# Patient Record
Sex: Male | Born: 1960 | Race: Black or African American | Hispanic: No | Smoking: Never smoker
Health system: Southern US, Community
[De-identification: ages and names within clinical notes are randomized; demographics above are authoritative.]

## PROBLEM LIST (undated history)

## (undated) DIAGNOSIS — N186 End stage renal disease: Secondary | ICD-10-CM

## (undated) DIAGNOSIS — D649 Anemia, unspecified: Secondary | ICD-10-CM

## (undated) DIAGNOSIS — I10 Essential (primary) hypertension: Secondary | ICD-10-CM

## (undated) DIAGNOSIS — T7840XA Allergy, unspecified, initial encounter: Secondary | ICD-10-CM

## (undated) DIAGNOSIS — E785 Hyperlipidemia, unspecified: Secondary | ICD-10-CM

## (undated) DIAGNOSIS — Z8489 Family history of other specified conditions: Secondary | ICD-10-CM

## (undated) DIAGNOSIS — Z992 Dependence on renal dialysis: Secondary | ICD-10-CM

## (undated) DIAGNOSIS — Z5189 Encounter for other specified aftercare: Secondary | ICD-10-CM

## (undated) HISTORY — PX: OTHER SURGICAL HISTORY: SHX169

## (undated) HISTORY — PX: KIDNEY TRANSPLANT: SHX239

## (undated) HISTORY — DX: Hyperlipidemia, unspecified: E78.5

## (undated) HISTORY — DX: Dependence on renal dialysis: Z99.2

## (undated) HISTORY — DX: Allergy, unspecified, initial encounter: T78.40XA

## (undated) HISTORY — DX: Encounter for other specified aftercare: Z51.89

## (undated) HISTORY — DX: Essential (primary) hypertension: I10

---

## 2000-05-14 ENCOUNTER — Encounter: Payer: Self-pay | Admitting: Internal Medicine

## 2000-05-14 ENCOUNTER — Ambulatory Visit (HOSPITAL_COMMUNITY): Admission: RE | Admit: 2000-05-14 | Discharge: 2000-05-14 | Payer: Self-pay | Admitting: Internal Medicine

## 2000-05-16 ENCOUNTER — Emergency Department (HOSPITAL_COMMUNITY): Admission: EM | Admit: 2000-05-16 | Discharge: 2000-05-16 | Payer: Self-pay | Admitting: Emergency Medicine

## 2003-02-01 ENCOUNTER — Emergency Department (HOSPITAL_COMMUNITY): Admission: AD | Admit: 2003-02-01 | Discharge: 2003-02-01 | Payer: Self-pay | Admitting: Family Medicine

## 2004-01-26 ENCOUNTER — Encounter: Admission: RE | Admit: 2004-01-26 | Discharge: 2004-01-26 | Payer: Self-pay | Admitting: Nephrology

## 2004-02-05 ENCOUNTER — Encounter: Admission: RE | Admit: 2004-02-05 | Discharge: 2004-02-05 | Payer: Self-pay | Admitting: Nephrology

## 2005-01-03 ENCOUNTER — Observation Stay (HOSPITAL_COMMUNITY): Admission: EM | Admit: 2005-01-03 | Discharge: 2005-01-04 | Payer: Self-pay | Admitting: Emergency Medicine

## 2005-01-15 ENCOUNTER — Ambulatory Visit: Admission: RE | Admit: 2005-01-15 | Discharge: 2005-01-15 | Payer: Self-pay | Admitting: Vascular Surgery

## 2005-01-28 ENCOUNTER — Emergency Department (HOSPITAL_COMMUNITY): Admission: EM | Admit: 2005-01-28 | Discharge: 2005-01-28 | Payer: Self-pay | Admitting: Emergency Medicine

## 2005-04-09 ENCOUNTER — Ambulatory Visit (HOSPITAL_COMMUNITY): Admission: RE | Admit: 2005-04-09 | Discharge: 2005-04-10 | Payer: Self-pay | Admitting: General Surgery

## 2005-05-30 ENCOUNTER — Ambulatory Visit (HOSPITAL_COMMUNITY): Admission: RE | Admit: 2005-05-30 | Discharge: 2005-05-30 | Payer: Self-pay | Admitting: Vascular Surgery

## 2006-06-11 ENCOUNTER — Inpatient Hospital Stay (HOSPITAL_COMMUNITY): Admission: AD | Admit: 2006-06-11 | Discharge: 2006-06-13 | Payer: Self-pay | Admitting: Nephrology

## 2006-06-12 ENCOUNTER — Ambulatory Visit: Payer: Self-pay | Admitting: Vascular Surgery

## 2006-06-18 ENCOUNTER — Encounter (HOSPITAL_COMMUNITY): Admission: RE | Admit: 2006-06-18 | Discharge: 2006-06-19 | Payer: Self-pay | Admitting: Nephrology

## 2007-10-18 ENCOUNTER — Ambulatory Visit (HOSPITAL_COMMUNITY): Admission: RE | Admit: 2007-10-18 | Discharge: 2007-10-18 | Payer: Self-pay | Admitting: Nephrology

## 2007-12-08 ENCOUNTER — Ambulatory Visit (HOSPITAL_COMMUNITY): Admission: RE | Admit: 2007-12-08 | Discharge: 2007-12-08 | Payer: Self-pay | Admitting: Nephrology

## 2008-01-03 ENCOUNTER — Ambulatory Visit (HOSPITAL_COMMUNITY): Admission: RE | Admit: 2008-01-03 | Discharge: 2008-01-03 | Payer: Self-pay | Admitting: Nephrology

## 2008-12-29 ENCOUNTER — Encounter: Admission: RE | Admit: 2008-12-29 | Discharge: 2008-12-29 | Payer: Self-pay | Admitting: Nephrology

## 2009-07-17 ENCOUNTER — Emergency Department (HOSPITAL_COMMUNITY)
Admission: EM | Admit: 2009-07-17 | Discharge: 2009-07-17 | Payer: Self-pay | Source: Home / Self Care | Admitting: Emergency Medicine

## 2010-04-14 ENCOUNTER — Encounter: Payer: Self-pay | Admitting: Nephrology

## 2010-04-15 ENCOUNTER — Encounter: Payer: Self-pay | Admitting: Nephrology

## 2010-08-09 NOTE — Op Note (Signed)
[name], [name] NO.:  [id number]   MEDICAL RECORD NO.:  [id number]          PATIENT TYPE:  OIB   LOCATION:  5528                         FACILITY:  [facility]   PHYSICIAN:  Orson Ape. Weatherly, M.D.DATE OF BIRTH:  11/05/1960   DATE OF PROCEDURE:  04/09/2005  DATE OF DISCHARGE:                                 OPERATIVE REPORT   PREOPERATIVE DIAGNOSIS:  End-stage renal disease secondary to hypertension,  desires chronic ambulatory peritoneal dialysis.   OPERATION:  Placement of double-cuff chronic ambulatory peritoneal dialysis  catheter.   SURGEON:  Orson Ape. Rise Patience, M.D.   ANESTHESIA:  General anesthesia.   HISTORY:  Ryan [name] is a 50 year old black male, a hypertensive  patient, who developed renal failure and was started on hemodialysis,  November '06.  The patient states that he felt poorly after the  hemodialysis, had to stop working and has considered chronic ambulatory  peritoneal dialysis and feels that this will allow him more freedom and  hopes to go back to work, where he works for [employer].  He is presently  divorced and has a child for which he is caring.  The patient had  hemodialysis yesterday and today his hematocrit was 13, white normal at  5800, potassium of 4.0.   DESCRIPTION OF PROCEDURE:  Preoperatively, he was given a gram of Kefzol and  taken to the operating suite with induction of anesthesia.  He is quite  muscular.  Endotracheal tube was position and the abdomen was prepped with  Betadine surgical scrubbing solution and draped in a sterile manner.  He has  had no previous abdominal surgery and a small incision was made slightly  below and to the right of the umbilicus with sharp dissection down through  the skin, subcutaneous tissue and anterior rectus fascia.  A few blood  vessels were coagulated for good hemostasis.  The rectus muscle was split in  the direction of its fibers, exposing the posterior rectus  fascia and  peritoneum; these were picked up between hemostats and very carefully went  through both layers, which were grasped with 3 hemostats.  You could see the  small bowel and I could pull up the posterior rectus fascia and a  pursestring suture of 2-0 Vicryl was placed and then a second placed outside  of this and then an [place] right catheter over the guidewire was inserted  in the lower abdomen and the pursestring sutures tied so that the Silastic  ball was in the peritoneal cavity and the internal cuff was lying flat on  the posterior rectus fascia.  I then anesthetized this fascia and peritoneum  with Marcaine with adrenaline.  The rectus muscle was approximated with 3-0  chromic sutures and then the anterior rectus fascia was closed with  interrupted sutures of 2-0 Prolene.  The catheter goes obliquely through the  abdominal wall.  I repositioned the catheter with the Foley catheter guide  and then tunneled the catheter subcutaneously to exit in the right lower  quadrant.  The external cuff is right at the skin-dermis junction and the  catheter flushes easily and returns clear.  The subcutaneous tissue was  closed with 3-0 chromic.  The skin was closed with interrupted 5-0 nylon and  the catheter was hooked up to the connector and then extension tube; it  flushed easily and the patient was then extubated and taken to the recovery  room in a stable postop condition.  Antibiotic ointment and 4 x 4's were applied and the catheter dressing was  securely attached to the abdomen.  The patient probably would like to spend  the night, since he is for hemodialysis the first thing in the morning and  be discharged afterwards.           ______________________________  Orson Ape. Rise Patience, M.D.     WJW/MEDQ  D:  04/09/2005  T:  04/10/2005  Job:  [id number]   cc:   Darrold Span. Florene Glen, M.D.  Fax: [phone]   [facility]   Burgoon Deterding, M.D.  Fax: [phone]

## 2010-08-09 NOTE — Consult Note (Signed)
Ryan [name], Ryan [name] NO.:  [id number]   MEDICAL RECORD NO.:  [id number]          PATIENT TYPE:  INP   LOCATION:  5526                         FACILITY:  [facility]   PHYSICIAN:  Imogene Burn. Georgette Dover, M.D. DATE OF BIRTH:  [birth year]   DATE OF CONSULTATION:  DATE OF DISCHARGE:                                 CONSULTATION   REASON FOR CONSULTATION:  Removal of peritoneal dialysis catheter.   The patient is a 50 year old male with end-stage renal disease secondary  hypertension.  Dr. Rise Patience placed a peritoneal dialysis catheter  January 2007.  Apparently the patient has had several episodes of  peritonitis growing Stenotrophomonas.  Apparently the nephrologists have  had limited success in treating this with antibiotics.  He now needs the  catheter removed.  Apparently there was some difficulty in getting this  scheduled as an outpatient so the patient was admitted to the hospital  and we were consulted for removal.  The patient is on antibiotics and  feels fine.  He does not have any abdominal pain at this time.   PAST MEDICAL HISTORY:  1. End-stage renal disease.  2. Hypertension.  3. Hyperlipidemia.  4. Gout.  5. Secondary hyperparathyroidism.  6. History of medical noncompliance.   PAST SURGICAL HISTORY:  Diatek catheter placement. Peritoneal dialysis  catheter placement   ALLERGIES:  None.   MEDICATIONS:  1. Clonidine.  2. Metoprolol.  3. Nephro-Vite.  4. PhosLo.  5. Nifedipine.   SOCIAL HISTORY:  Nonsmoker, nondrinker.   PHYSICAL EXAMINATION:  VITAL SIGNS:  Temperature 98.1, pulse 90, blood  pressure 159/112, respirations 20, 98% on room air.  GENERAL:  This is a well-developed, well-nourished male in no apparent  distress.  HEENT:  EOMI.  Sclerae anicteric.  HEART:  Regular rate and rhythm.  LUNGS:  Clear.  ABDOMEN:  Intact PD catheter site with no tenderness, no erythema.  No  fluctuans.   IMPRESSION:  Recurrent bacterial peritonitis needing  peritoneal dialysis  catheter removed.   PLAN:  We will make the patient n.p.o. for now.  We will schedule him  for removal of the PD catheter later today.      Imogene Burn. Tsuei, M.D.  Electronically Signed     MKT/MEDQ  D:  06/12/2006  T:  06/12/2006  Job:  [id number]

## 2010-08-09 NOTE — Discharge Summary (Signed)
NAMEGOMER, Ryan [name] NO.:  [id number]   MEDICAL RECORD NO.:  [id number]          PATIENT TYPE:  OBV   LOCATION:  5524                         FACILITY:  [facility]   PHYSICIAN:  Alvin C. Florene Glen, M.D.  DATE OF BIRTH:  10-24-1960   DATE OF ADMISSION:  01/03/2005  DATE OF DISCHARGE:  01/04/2005                                 DISCHARGE SUMMARY   ADMITTING DIAGNOSIS:  1.  Uremia.  2.  Uncontrolled hypertension.  3.  Anemia of chronic disease.  4.  Non-adherence and denial of medical condition.  5.  History of gout.  6.  Hyperlipidemia.  7.  Advanced chronic kidney disease.  8.  Metabolic acidosis secondary to chronic kidney disease.   DISCHARGE DIAGNOSIS:  1.  Uremia.  2.  Advanced chronic kidney disease to near end-stage renal disease.  3.  Uncontrolled hypertension.  4.  Non-adherence and denial of medical condition.  5.  Hyperlipidemia.  6.  Gout.  7.  Anemia.  8.  Metabolic acidosis secondary to chronic kidney disease.   BRIEF HISTORY:  50 year old black male with chronic kidney disease secondary  to uncontrolled hypertension first seen in our office April 2004 with a  creatinine of 2.2 and blood pressure of 170/120.  He appeared again as an  outpatient on November 2005 having missed or cancelled several previous  appointments.  At that time his creatinine was 3.3 and his blood pressure  was 172/120.  He was counseled on compliance of antihypertensive medications  and severity of kidney disease.  Again, he was lost to follow-up until he  return for an office visit December 27, 2004 with a creatinine of 13.5, a BUN  of 105, potassium 3.5, blood pressure 200/136.  To encourage compliance the  patient was changed to four generic antihypertensive medications which were  furosemide, clonidine, metoprolol, and Procardia SR and asked to return for  a follow-up blood pressure check.  He was seen one day prior to this  admission at [facility] with a blood  pressure of 150/114,  complaining of feeling poorly, cramping, fatigued, without appetite, and  decreased urine output.  Today he was asked to come to the hospital for  induction of dialysis and upon presenting himself became very upset that he  was tricked into hospitalization and wanted no part of initiation of  dialysis.   LABORATORIES ON ADMISSION:  White count 8500, hemoglobin 10.5, platelets  402,000.  Sodium 139, potassium 3.8, chloride 105, CO2 18, BUN 124,  creatinine 15.2, calcium 8.1, albumin 3.2, glucose 96.  LFTs within normal  limits.  Phosphorus 9.8.  Transferrin saturation level 17%, ferritin 346.   HOSPITAL COURSE:  Several long discussions were had with the patient with  myself and then again with Dr. Florene Glen and then again with Dr. [name].  These were all in regard to the severity of his kidney disease,  the requirement for the initiation of dialysis, and long-term prognosis.  He  was quite upset.  He wanted to be discharged and needed time to think about  all what lie ahead.  He wanted all arrangements  made on an outpatient basis.  He did did view one of the hemodialysis movies.  He was in no acute  distress.  An ultrasound of his kidneys showed no hydronephrosis, but with  diffuse echogenicity.  The left kidney measured 10.4 cm.  The right kidney  measured 9.3 cm.  His urinalysis was positive for blood, protein,  leukocytes, but negative for nitrites.  His lungs were clear.  There was no  cardiac rub.  He did have mild asterixis and trace pretibial edema.   Dr. Gae Gallop met with the patient and discussed access with him.  It  was concluded the patient while was refusing to stay and he being relatively  medically stable he was discharged home.  Outpatient arrangements were made  for vein mapping to be done Monday, October 16 at Dr. Nicole Cella office.  He  will have Diatek and left AV fistula versus AV graft placed Tuesday, January 07, 2005.    DISCHARGE MEDICATIONS:  1.  Clonidine 0.2 mg b.i.d.  2.  Metoprolol 100 mg b.i.d.  3.  Procardia SR 60 mg q.p.m.  4.  Nephro-Vite vitamin one daily.  5.  Calcium carbonate 500 mg one with each meal      Nonah Mattes, P.A.      Darrold Span. Florene Glen, M.D.  Electronically Signed    RRK/MEDQ  D:  03/24/2005  T:  03/24/2005  Job:  [id number]

## 2010-08-09 NOTE — Discharge Summary (Signed)
NAMETEHRON, LAVER NO.:  [id number]   MEDICAL RECORD NO.:  [id number]          PATIENT TYPE:  INP   LOCATION:  5526                         FACILITY:  [facility]   PHYSICIAN:  Nonah Mattes, P.A.   DATE OF BIRTH:  December 11, 1960   DATE OF ADMISSION:  06/11/2006  DATE OF DISCHARGE:  06/13/2006                               DISCHARGE SUMMARY   ADMITTING DIAGNOSES:  1. Recurrent peritonitis.  2. End-stage renal disease on peritoneal dialysis.  3. Hypertension.  4. Anemia of chronic disease.  5. Hypothyroidism.   DISCHARGE DIAGNOSES:  1. Status post removal of peritoneal dialysis catheter, secondary to      recurrent Stenotrophomonas and Pseudomonas peritonitis March 20,      Dr. Kaylyn Lim.  2. Status post acute tissue bleed at that site of removed PD catheter      March 10, Dr. Kaylyn Lim.  3. Placement of right IJ Arrow dialysis catheter, Dr. Ruta Hinds,      June 12, 2006.  4. End-stage renal disease, now on hemodialysis.  5. Hypertension.  6. Anemia of chronic disease.  7. Hypothyroidism.   BRIEF HISTORY:  A 50 year old black male with end-stage renal disease,  on chronic peritoneal dialysis since January 2007, has had recurrent  gram- negative peritonitis with various organisms including  Stenotrophomonas and Pseudomonas.  Again, has been refractory to  antibiotic therapy.  Yesterday, his BD white blood cell count was 4,400  and it was discussed to remove the catheter for 2-4 months and use  hemodialysis in the interim.  As an outpatient, he is being treated with  Tobramycin and Levaquin.  He is being admitted now for PD catheter  removal.   LABS ON ADMISSION:  White count 7,700, hemoglobin 10.1, platelets  269,000, sodium 137, potassium 4.9, chloride 99, CO2 30, glucose 127,  BUN 43, creatinine 13.6, albumin 2.4, LFTs within normal limits,  phosphorus 5.7, PD white count of 1,670 with 68 segs, 11 lymphocytes, 21  monocytes.   HOSPITAL COURSE:   The patient was admitted, put on his usual  medications, and put on Primaxin and Levaquin as antibiotics.  On the  second hospital day, he underwent removal of PD catheter and placement  of a right IJ dialysis catheter, which he tolerated well.  Postoperatively, his catheter removal site bled, despite pressure  dressings and frequent dressing changes.  That same evening, he was  returned to the operating room where Dr. Hassell Done did an irrigation and  evacuation of hematoma and ligation of fascia.  Postoperatively, that  time he had no further bleeding.  The patient's hemoglobin was 8.6 at  time of discharge.   A dialysis catheter was used for one dialysis treatment on March 22.  A  post-dialysis weight of 82.0 kg was obtained.  He had some mild cramping  which resolved.  The catheter functioned well.  Iron study showed a  transferrin saturation level of 18%.  Aranesp was started in the  hospital, and EPO will be continued as an outpatient at 20,000 units IV  each dialysis.  InFeD was given as a test dose in  the hospital and will  be continued as a bolus of 100 mg IV each dialysis x10 mid-weekly. Tight  heparin will be used for 2 weeks.  Patient is to follow-up with Dr.  Rise Patience, 1-2 weeks after discharge.  Outpatient arrangements for  dialysis were made at the [facility] every  Tuesday, Thursday, Saturday at 11:30 a.m.  He had no further bleeding,  and he was discharged to complete antibiotics as an outpatient.   DISCHARGE MEDICATIONS:  1. Levaquin 500 mg, one every evening for 2 more weeks.  2. Dialyvite one daily 667 mg, three with meals.  3. Nifedipine 90 mg q.h.s.  4. Lisinopril 40 mg q.h.s.  5. Clonidine 0.1 mg b.i.d.  6. Hold the morning of dialysis.  7. Vicodin 1-2 pills every 6 hours p.r.n. pain.  8. EPO 20,000 units IV each dialysis.  9. InFeD 100 mg IV each dialysis x10, then weekly.  10.Estimated dry weight 81 kg.  Follow up with Dr. Rise Patience 1-2  weeks      after discharge.      Nonah Mattes, P.A.     RRK/MEDQ  D:  07/16/2006  T:  07/16/2006  Job:  [id number]   cc:   Orson Ape. Rise Patience, M.D.

## 2010-08-09 NOTE — Op Note (Signed)
[name], [name] NO.:  [id number]   MEDICAL RECORD NO.:  [id number]          PATIENT TYPE:  INP   LOCATION:  5526                         FACILITY:  [facility]   PHYSICIAN:  Isabel Caprice. Hassell Done, MD  DATE OF BIRTH:  09/19/1960   DATE OF PROCEDURE:  06/12/2006  DATE OF DISCHARGE:                               OPERATIVE REPORT   PREOPERATIVE DIAGNOSIS:  Bleeding from site of removal of peritoneal  dialysis catheter.   POSTOPERATIVE DIAGNOSIS:  Bleeding from site of removal of peritoneal  dialysis catheter.   PROCEDURE:  Re-exploration of peritoneal dialysis catheter wound,  cautery of muscle and tract with the Bovie, and closure of the fascia.   SURGEON:  Isabel Caprice. Hassell Done, M.D.   ANESTHESIA:  General endotracheal.   DESCRIPTION OF PROCEDURE:  Ryan [name] underwent removal of a  peritoneal dialysis catheter at about 1500 hours today and also had a  Diatek catheter placed by Dr. Oneida Alar.  That was earlier in the afternoon  and he went back to his room.  This evening while lying in bed, maybe  after moving around a little bit, he began having some bleeding from  into his wound.  I was contacted at about 8:45, told that he was  bleeding, and requested a suture set to the bedside, but I was contacted  later by the rapid response team and we elected to go ahead and meet him  directly in the operating room to explore this area.   The patient was taken back to room 16 and given general anesthesia.  Endotracheal intubation was performed and the abdomen was then prepped  with TechniCare.  I opened his incision a little bit more and with  retractors, visualized well the muscles.  I did this after I removed the  packing.  There was no active arterial bleeding, although I suspect  there might be something in the muscle and I went ahead and went to the  depths of this wound and cauterized it with electrocautery and suctioned  on it, looked at it, and stimulated it, and saw  no active pumping or  bleeders.  I went ahead and completed the cautery of the entire tract.  I then approximated the fascia with three sutures of 0 Vicryl,  approximating the fascia over the muscle and incorporating the muscle in  this closure.  There was no bleeding seen.  I cauterized the edges of  the wound and I went ahead and stapled the wound shut after irrigating  it and did not elect to repack it at this point.  The patient remained  hemodynamically stable throughout and was taken to the recovery room in  satisfactory addition.  The plan is to take him back to his room.      Isabel Caprice Hassell Done, MD  Electronically Signed     MBM/MEDQ  D:  06/12/2006  T:  06/12/2006  Job:  [id number]

## 2010-08-09 NOTE — Op Note (Signed)
NAMEILYASS, SLAPPY NO.:  [id number]   MEDICAL RECORD NO.:  [id number]          PATIENT TYPE:  INP   LOCATION:  5526                         FACILITY:  [facility]   PHYSICIAN:  Jessy Oto. Fields, MD  DATE OF BIRTH:  06-13-1960   DATE OF PROCEDURE:  06/12/2006  DATE OF DISCHARGE:                               OPERATIVE REPORT   PROCEDURE:  1. Ultrasound of neck.  2. Insertion of right internal jugular vein Diatek catheter.   PREOPERATIVE DIAGNOSIS:  End-stage renal disease.   POSTOPERATIVE DIAGNOSIS:  End-stage renal disease.   ANESTHESIA:  General.   ASSISTANT:  Nurse   OPERATIVE FINDINGS:  1. A 23 cm Diatek catheter right internal jugular vein.   OPERATIVE DETAIL:  After obtaining informed consent, the patient was  taken to the operating.  The patient was placed in the supine position  on the operating table.  After induction of general anesthesia and  endotracheal intubation, the patient had a peritoneal dialysis catheter  removed by Dr. Hassell Done.  This is dictated as a separate operative note.  At conclusion of the removal of peritoneal dialysis catheter portion of  the procedure I performed an ultrasound of the neck.  This showed  bilateral patent internal jugular veins which had normal compressibility  and respiratory variation.  Next the patient's entire neck and chest  were prepped and draped in the usual sterile fashion.  Small finder  needle was used to localize right internal jugular vein.  A larger  introducer needle was used to cannulate the right internal jugular vein  and a 0.035 J-tip guide wire threaded through the right internal jugular  vein down into the inferior vena cava under fluoroscopic guidance.  Next  sequential 12, 14 and 16-French dilators with peel-away sheath placed  over the guidewire in the right atrium.  Guidewire and dilator was  removed.  A 23-cm Diatek catheter was then placed down through the peel-  away sheath into the  right atrium.  Catheter was then tunneled  subcutaneously, cut to length and hub attached.  Catheter was noted to  flush and draw easily.  Catheter was sutured to skin with nylon sutures.  The neck insertion site closed with Vicryl stitch.  Catheter was  inspected under fluoroscopy, found with its tip to be in the right  atrium.  No kinks throughout its course.  Catheter was then loaded with  concentrated heparin solution.  The patient tolerated the procedure well  and there were no complications.  Instrument, sponge, needle counts  correct at the end of the case.  The patient extubated in the operating  room taken recovery room in stable condition.     Jessy Oto. Fields, MD  Electronically Signed    CEF/MEDQ  D:  06/12/2006  T:  06/12/2006  Job:  [id number]

## 2010-08-09 NOTE — Consult Note (Signed)
NAMEMarland [name]  CYNCERE, MUNN NO.:  [id number]   MEDICAL RECORD NO.:  [id number]          PATIENT TYPE:  OIB   LOCATION:  5528                         FACILITY:  [facility]   PHYSICIAN:  Donato Heinz, M.D.DATE OF BIRTH:  01-Jul-1960   DATE OF CONSULTATION:  04/09/2005  DATE OF DISCHARGE:                                   CONSULTATION   REASON FOR CONSULTATION:  Hypertension. End-stage renal disease.   HISTORY OF PRESENT ILLNESS:  Mr. Ryan [name] is a 50 year old African American  male with past medical history significant for end-stage renal disease  secondary to hypertension who started on hemodialysis in November 2006. He  decided that he was going to switch to peritoneal dialysis and was admitted  by Dr. Jeanella Anton for placement of a PD catheter. We have been asked  to help manage his hypertension and end-stage renal disease. He currently  undergoes his hemodialysis every Tuesday-Thursday-Saturday at [facility].   ALLERGIES:  He has no known drug allergy.   PAST MEDICAL HISTORY:  1.  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  2.  Hypertension.  3.  Hyperlipidemia.  4.  Gout.  5.  Secondary hyperthyroidism.  6.  Metabolic acidosis secondary to chronic kidney disease.  7.  History of medical noncompliance.  8.  Secondary hyperparathyroidism.   CURRENT MEDICATIONS:  1.  Clonidine 0.1 mg b.i.d.  2.  Metoprolol 50 mg b.i.d.  3.  Nephro-Vite 1 a day.  4.  PhosLo gel caps 667, two with each meal.  5.  Nifedipine 60 mg at bedtime.   FAMILY HISTORY:  Significant for hypertension, diabetes, and lung cancer.   SOCIAL HISTORY:  He is divorced. He has a child. He is accompanied by his  mother. Denies tobacco or alcohol. He has 2 children that are alive and  well. He worked full time as an Optometrist.   REVIEW OF SYSTEMS:  GENERAL: Denies anorexia or malaise.  OPHTHALMIC: No blurry vision or photophobia.  CARDIAC: No  chest pain, palpitations, orthopnea, PND.  PULMONARY: No shortness of breath, hemoptysis, productive cough.  GI: No nausea, vomiting, hematochezia, melena, or bright red blood per  rectum.  GU: No dysuria, polyuria, hematuria, urgency, frequency, retention.   All other systems are negative.   PHYSICAL EXAM:  GENERAL: A well-developed, well-nourished man in no current  distress.  VITAL SIGNS: He is afebrile. Vital signs stable. Blood pressure 127/63,  pulse of 90, respiratory rate is 18.  HEENT EXAM: Head normocephalic and atraumatic. Pupils equal, round, and  reactive to light. Extraocular muscles intact. No icterus. Oropharynx  without lesions.  LUNGS: Clear to auscultation and percussion bilaterally. No rales or  rhonchi.  CARDIAC EXAM: Regular rate and rhythm. No precordial rub appreciated.  ABDOMINAL EXAM: Had decreased bowel sounds but present. Soft. No guarding.  No rebound. He has a PD catheter in place.  EXTREMITIES: No clubbing, cyanosis, or edema.  CHEST: He has a right IJ catheter in place. No pain or discharge at exit  site.   LABS:  His white blood cell count is 5.8, hemoglobin 13.4, platelets 288,  sodium  135, potassium 4, chloride 94, CO2 30, BUN 20, creatinine 8.5,  glucose 91.   ASSESSMENT AND PLAN:  1.  End-stage renal disease. He is status post peritoneal dialysis catheter      placement. He is doing well. He will flush daily and follow up with Dr.      Rise Patience and Dr. Florene Glen will assume his peritoneal dialysis      prescription once his peritoneal dialysis catheter is mature.  2.  End-stage renal disease. Plan for hemodialysis tomorrow. His estimated      dry weight is 88.5; however, he reports that he has been coming in close      to his dry weight. Will decrease it to 88. He runs for 4 hours and will      continue to follow his blood pressures and labs.  3.  Anemia. Will obtain his outpatient EPO dose.  4.  Secondary hyperparathyroidism. Will continue  with PhosLo once he has an      advanced diet and obtain vitamin E dose from his outpatient records.   The patient was otherwise stable and we will continue to follow along.           ______________________________  Donato Heinz, M.D.     JC/MEDQ  D:  04/09/2005  T:  04/10/2005  Job:  [id number]

## 2010-08-09 NOTE — Consult Note (Signed)
Ryan [name], SCALZITTI NO.:  [id number]   MEDICAL RECORD NO.:  [id number]          PATIENT TYPE:  OBV   LOCATION:  5524                         FACILITY:  [facility]   PHYSICIAN:  Judeth Cornfield. Scot Dock, M.D.DATE OF BIRTH:  [birth year]   DATE OF CONSULTATION:  01/04/2005  DATE OF DISCHARGE:                                   CONSULTATION   VASCULAR SURGERY CONSULTATION:   REASON FOR CONSULTATION:  Need for hemodialysis access.   HISTORY:  This is a pleasant 50 year old gentleman with chronic renal  insufficiency secondary to hypertension.  We were asked to evaluate him by  Dr. Florene Glen for hemodialysis access.  We were asked to place both a permanent  access and also a catheter early next week.  Of note he is right handed.  He  has not yet begun dialysis.   PAST MEDICAL HISTORY:  1.  Advanced chronic kidney disease.  2.  Uncontrolled hypertension.  3.  Dyslipidemia.  4.  Gout.   FAMILY HISTORY:  Hypertension, diabetes, and lung cancer.   SOCIAL HISTORY:  He is separated.  He has two children.   PHYSICAL EXAMINATION:  Blood pressure is 120/70, heart rate is 80, he has a  palpable brachial and radial pulse bilaterally.  I do not see a usable  forearm cephalic vein on either side.  His lungs are clear bilaterally to  auscultation.   IMPRESSION:  I have recommended that we map his forearm and upper arm  cephalic vein on the left.  If this is adequate we will attempt placement of  a fistula.  If not we will place an AV graft.  In addition, we will place a  temporary dialysis catheter to be used for dialysis immediately.  I have  discussed the indications for the procedure and the potential complications  including, but not limited to, failure of the fistula to mature, graft  thrombosis, graft infection, steal syndrome and wound healing problems.  All  of his questions were answered and he was agreeable to proceed.  His surgery  has been scheduled for January 07, 2005  which is Tuesday.      Judeth Cornfield. Scot Dock, M.D.  Electronically Signed     CSD/MEDQ  D:  01/04/2005  T:  01/04/2005  Job:  [id number]

## 2010-08-09 NOTE — H&P (Signed)
Ryan [name], Ryan [name] NO.:  [id number]   MEDICAL RECORD NO.:  [id number]          PATIENT TYPE:  INP   LOCATION:  5526                         FACILITY:  [facility]   PHYSICIAN:  James L. Deterding, M.D.DATE OF BIRTH:  [birth year]   DATE OF ADMISSION:  06/11/2006  DATE OF DISCHARGE:                              HISTORY & PHYSICAL   HISTORY OF PRESENT ILLNESS:  This is a 50 year old gentleman with end-  stage renal disease managed on peritoneal dialysis.  Since his first  weekend in February, he has had recurrent gram-negative peritonitis with  extended trichomonas and pseudomonas and has been recurrent, he has not  been able to clear that.  He has had various times where it has gotten  better and worse.  Yesterday he had a white cell count of 4,400, it was  elected to remove the catheter for 2 to 4 months and then put him back  on peritoneal dialysis if that is what he desires to do.  General  surgery was contacted and they did not refuse to take the cath out as an  outpatient.  He has had no permanent hemodialysis axis either.  He has  been on outpatient tobramycin and Levaquin, which he already has some  sensitivity to.  He has suffered recurrent peritonitis and at this time,  he says the abdominal pain was a lot better than it was yesterday.  He  only has mild pain at this time.   REVIEW OF SYSTEMS:  HEENT:  Denies facial difficulty, headache, sore  throat.  He has a dry mouth.  He has no postnasal drip at the current  time.  NEUROLOGICAL:  Negative.  MUSCULOSKELETAL:  He denies aches and  pains.  PULMONARY:  No cough or sputum production.  GI:  No nausea,  vomiting, diarrhea.  No itching, muscle cramping.  No history of  hepatitis, yellow jaundice.  SKIN:  Unremarkable.  CARDIOVASCULAR:  He  denies edema, PND.  Sleeps on a pillow.  He has had no trouble with  inflow and outflow with his PD catheter and usually uses 2.5% exchanges.   MEDICATIONS:  As an  outpatient:  1. PhosLo 667 mg 3 with meals.  2. Clonidine 0.2 mg a.m. and p.m., supposedly does not take that      regularly.  3. Domevit once a day.  4. Phenergan p.r.n.  5. Nifedipine ER 9 mg 1 b.i.d.  6. Metoprolol 100 mg b.i.d.  7. EPO 8,000 units a week.  8. Restoril 15 mg nightly p.r.n.  9. He had been on over-the-counter iron also.   PAST MEDICAL HISTORY:  Other illness included:  1. Dyslipidemia.  2. Gout.  3. 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  4. Anemia.   ALLERGIES:  NO KNOWN ALLERGIES.   SOCIAL HISTORY:  He is retired on disability from [employer].  He  lives with his 76-year-old son.  A nondrinker, nonsmoker.   FAMILY HISTORY:  Father died of cancer.  No family history of renal  disease.  He has 2 sisters who are healthy, he said one of them has high  blood pressure.  PHYSICAL EXAMINATION:  GENERAL:  In no acute distress.  VITAL SIGNS:  Blood pressure is 152/92 to 146/90.  HEENT:  Arterial narrowing with some AV nicking.  NECK:  Unremarkable.  No masses or thyromegaly.  LUNGS:  Reveal normal percussion, normal expansion, normal breath  sounds.  No rales, rhonchi, or wheezes.  CARDIOVASCULAR:  S4, grade 2/6 systolic ejection murmur best heard upper  sternal border.  PMI is 10 at 5th intercostal space.  Soft left  ventricular lift, 1+ edema, pulse is 2+/4+.  CHEST:  No bruits.  He has a soft left ventricular lift.  ABDOMEN:  Positive bowel sounds.  Minimal tenderness.  Positive fluid  wave.  PEG site in the right mid abdomen.  SKIN:  Somewhat dry.  No rash.  MUSCULOSKELETAL:  Unremarkable.  NEUROLOGICAL EXAM:  Cranial nerves II-XII grossly intact.  Motor is 5/5  and symmetric.  Deep tendon reflexes are 2+/4+.  Toes downgoing.   ASSESSMENT:  1. Recurrent peritonitis.  Give Perimax and Levaquin, which the      organism is sensitive to.  Get the catheter out.  Surgeries has      been paged at this point.  Hopefully we will get that out tomorrow       and get him back on hemodialysis.  2. End-stage renal disease.  Check his lipase at this time.  Check      entire species surface antigen.  Start hemodialysis.  He is set up      for outpatient dialysis at [facility] and get a perm      cath.  3. Hypertension.  We will change to once a day medicines, if he can      tolerate dose.  4. Anemia.  Put n.p.o. and check his iron.  5. Hypothyroidism.  Use Hectorol.   PLAN:  1. Get the catheter out.  2. Perm cath.  3. Anabolic's.  4. Labs.  5. EPO and vitamin D.           ______________________________  Joyice Faster. Deterding, M.D.     JLD/MEDQ  D:  06/11/2006  T:  06/12/2006  Job:  [id number]   cc:   [facility]

## 2010-08-09 NOTE — Op Note (Signed)
Ryan [name], Ryan [name] NO.:  [id number]   MEDICAL RECORD NO.:  [id number]          PATIENT TYPE:  AMB   LOCATION:  DFTL                         FACILITY:  [facility]   PHYSICIAN:  Nelda Severe. Kellie Simmering, M.D.  DATE OF BIRTH:  02-Mar-1961   DATE OF PROCEDURE:  01/15/2005  DATE OF DISCHARGE:  01/15/2005                                 OPERATIVE REPORT   PREOPERATIVE DIAGNOSIS:  End-stage renal disease.   POSTOPERATIVE DIAGNOSIS:  End-stage renal disease.   PROCEDURE:  1.  Bilateral ultrasound localization of internal jugular veins.  2.  Insertion of Diatek catheter via right internal jugular vein (28 cm).   SURGEON:  Nelda Severe. Kellie Simmering, M.D.   FIRST ASSISTANT:  Nurse.   ANESTHESIA:  Local.   PROCEDURE:  The patient was taken to the operating room and placed in the  Trendelenburg position, and the upper chest and neck were prepped.  Both  internal jugular veins were imaged using B-mode ultrasound.  The right was  larger than the left, but both were patent.  After infiltration with 1%  Xylocaine, the right internal jugular vein was entered using a  supraclavicular approach and guidewire passed into the right atrium under  fluoroscopic guidance.  After dilating the tract appropriately, a 28-cm  Diatek catheter was positioned in the right atrium, peel-away sheath  removed, the catheter tunneled peripherally and secured with nylon sutures.  The wound was closed with Vicryl in a subcuticular fashion, sterile dressing  applied and patient taken to the recovery room in satisfactory condition.           ______________________________  Nelda Severe Kellie Simmering, M.D.     JDL/MEDQ  D:  01/15/2005  T:  01/16/2005  Job:  [id number]

## 2010-08-09 NOTE — Op Note (Signed)
NAMEJAMILL, PROCHNOW NO.:  [id number]   MEDICAL RECORD NO.:  [id number]          PATIENT TYPE:  INP   LOCATION:  5526                         FACILITY:  [facility]   PHYSICIAN:  Isabel Caprice. Hassell Done, MD  DATE OF BIRTH:  07-08-1960   DATE OF PROCEDURE:  06/12/2006  DATE OF DISCHARGE:                               OPERATIVE REPORT   SURGEON:  Isabel Caprice. Hassell Done, MD.   PROCEDURE:  Explantation of peritoneal dialysis catheter.   DESCRIPTION OF PROCEDURE:  Mr. Misiaszek was back in room 6 at [facility] to  have vascular dialysis catheter placement.  This area was prepped with  Betadine and draped sterilely.  I cut down on the first cuff and freed  it up.  I then redid the midline incision in partiality, and cut down  and grabbed the second cuff, dissected around it, and cut it out and  removed the catheter.  I cultured some of the fluid that came back, and  then packed it with 1/2-inch Iodoform gauze.  The patient seemed to  tolerate the procedure well and had the dialysis catheter placed after  that.      Isabel Caprice Hassell Done, MD  Electronically Signed     MBM/MEDQ  D:  06/12/2006  T:  06/12/2006  Job:  [id number]

## 2010-12-24 LAB — CBC
HCT: 38.9 — ABNORMAL LOW
Hemoglobin: 12.5 — ABNORMAL LOW
MCHC: 32.1
MCV: 87.2
Platelets: 194
RBC: 4.46
RDW: 17.6 — ABNORMAL HIGH
WBC: 7.9

## 2015-09-15 ENCOUNTER — Encounter (HOSPITAL_COMMUNITY): Payer: Self-pay | Admitting: Emergency Medicine

## 2015-09-15 ENCOUNTER — Emergency Department (HOSPITAL_COMMUNITY)
Admission: EM | Admit: 2015-09-15 | Discharge: 2015-09-15 | Disposition: A | Discharge status: Home / Self Care | Payer: Medicare Other | Attending: Emergency Medicine | Admitting: Emergency Medicine

## 2015-09-15 DIAGNOSIS — H578 Other specified disorders of eye and adnexa: Secondary | ICD-10-CM | POA: Diagnosis present

## 2015-09-15 DIAGNOSIS — Z7984 Long term (current) use of oral hypoglycemic drugs: Secondary | ICD-10-CM | POA: Insufficient documentation

## 2015-09-15 DIAGNOSIS — Z79899 Other long term (current) drug therapy: Secondary | ICD-10-CM | POA: Diagnosis not present

## 2015-09-15 DIAGNOSIS — Z7982 Long term (current) use of aspirin: Secondary | ICD-10-CM | POA: Insufficient documentation

## 2015-09-15 DIAGNOSIS — H1013 Acute atopic conjunctivitis, bilateral: Secondary | ICD-10-CM | POA: Diagnosis not present

## 2015-09-15 MED ORDER — CETIRIZINE HCL 10 MG PO TABS: 10.0000 mg | ORAL_TABLET | Freq: Every day | ORAL | Status: DC

## 2015-09-15 MED ORDER — OLOPATADINE HCL 0.2 % OP SOLN: 1.0000 [drp] | Freq: Every day | OPHTHALMIC | Status: DC

## 2015-09-15 NOTE — ED Notes (Signed)
Pt. Stated, I've had this eye irritation and driness thinking it was my allergies which I've never had before.

## 2015-09-15 NOTE — Discharge Instructions (Signed)
Use eye drops and allergy medication as directed. Warm compresses to the eyes at least one or two times a day. Stay hydrated.  Follow up with a primary care provider at the next available appointment.  Return to ER for new or worsening symptoms, any additional concerns.

## 2015-09-15 NOTE — ED Provider Notes (Signed)
CSN: [id number]     Arrival date & time 09/15/15  1433 History   First MD Initiated Contact with Patient 09/15/15 1605     Chief Complaint  Patient presents with  . Eye Problem   (Consider location/radiation/quality/duration/timing/severity/associated sxs/prior Treatment) Patient is a 55 y.o. male presenting with eye problem. The history is provided by the patient and medical records. No language interpreter was used.  Eye Problem Associated symptoms: itching and redness   Associated symptoms: no discharge, no headaches, no nausea, no photophobia and no vomiting     Ryan [name] is a 55 y.o. male  with a PMH of eczema and kidney transplant doing well followed by nephrology who presents to the Emergency Department complaining of persistent unchanged bilateral eye irritation and itching x 2-3 weeks. Patient states eyes are red and dry when he awakes. He has used over-the-counter eyedrops with good relief for an hour or so, but then symptoms return. Also states skin around eyes have been dry. Denies eye pain or discharge, no foreign body sensation. At onset of symptoms, he had associated nasal congestion and dry cough that resolved after a few days, however eyes are still bothering him. Denies cough, congestion, sore throat, visual changes, photophobia, headache, other rashes or skin problems, or any additional symptoms.   Past Medical History  Diagnosis Date  . Kidney transplant as cause of abnormal reaction or later complication    History reviewed. No pertinent past surgical history. No family history on file. Social History  Substance Use Topics  . Smoking status: Never Smoker   . Smokeless tobacco: None  . Alcohol Use: No    Review of Systems  Constitutional: Negative for fever and chills.  HENT: Negative for congestion.   Eyes: Positive for redness and itching. Negative for photophobia, pain, discharge and visual disturbance.  Respiratory: Negative for cough and shortness of  breath.   Cardiovascular: Negative.   Gastrointestinal: Negative for nausea, vomiting and abdominal pain.  Genitourinary: Negative for dysuria.  Musculoskeletal: Negative for back pain and neck pain.  Skin: Negative for rash.  Neurological: Negative for headaches.      Allergies  Review of patient's allergies indicates not on file.  Home Medications   Prior to Admission medications   Medication Sig Start Date End Date Taking? Authorizing Provider  aspirin 325 MG tablet Take 325 mg by mouth every morning.   Yes Historical Provider, MD  cetirizine (ZYRTEC) 10 MG tablet Take 1 tablet (10 mg total) by mouth daily. 09/15/15   Ozella Almond Ward, PA-C  Cholecalciferol (VITAMIN D3) 10000 units capsule Take 10,000 Units by mouth daily.   Yes Historical Provider, MD  cloNIDine (CATAPRES) 0.2 MG tablet Take 0.2 mg by mouth 2 (two) times daily. 08/24/15  Yes Historical Provider, MD  enalapril (VASOTEC) 10 MG tablet Take 10 mg by mouth daily. 07/13/15  Yes Historical Provider, MD  metFORMIN (GLUCOPHAGE) 500 MG tablet Take 500 mg by mouth daily. 07/31/15  Yes Historical Provider, MD  mycophenolate (MYFORTIC) 180 MG EC tablet Take 720 mg by mouth 2 (two) times daily.  07/24/15  Yes Historical Provider, MD  NIFEdipine (PROCARDIA-XL/ADALAT CC) 60 MG 24 hr tablet Take 60 mg by mouth 2 (two) times daily. 07/31/15  Yes Historical Provider, MD  Olopatadine HCl (PATADAY) 0.2 % SOLN Apply 1 drop to eye daily. 09/15/15   Ozella Almond Ward, PA-C  Omega-3 Fatty Acids (FISH OIL) 1000 MG CAPS Take 1,000 mg by mouth daily.   Yes Historical Provider,  MD  pravastatin (PRAVACHOL) 80 MG tablet Take 80 mg by mouth every morning. 08/16/15  Yes Historical Provider, MD  PROGRAF 1 MG capsule Take 7 mg by mouth 2 (two) times daily. 07/26/15  Yes Historical Provider, MD   BP 136/92 mmHg  Pulse 97  Temp(Src) 98 F (36.7 C) (Oral)  Resp 20  SpO2 98% Physical Exam  Constitutional: He is oriented to person, place, and time. He  appears well-developed and well-nourished.  Alert and in no acute distress  HENT:  Head: Normocephalic and atraumatic.  Nose: Nose normal.  Mouth/Throat: Oropharynx is clear and moist. No oropharyngeal exudate.  Eyes: EOM are normal. Pupils are equal, round, and reactive to light. Right eye exhibits no discharge. Left eye exhibits no discharge.  Bilateral eyes red with mild conjunctival injection. No surrounding erythema or tenderness.   Neck: Normal range of motion. Neck supple.  Cardiovascular: Normal rate, regular rhythm and normal heart sounds.  Exam reveals no gallop and no friction rub.   No murmur heard. Pulmonary/Chest: Effort normal and breath sounds normal. No respiratory distress. He has no wheezes. He has no rales. He exhibits no tenderness.  Abdominal: Soft. He exhibits no distension. There is no tenderness.  Lymphadenopathy:    He has no cervical adenopathy.  Neurological: He is alert and oriented to person, place, and time.  Skin: Skin is warm and dry. No rash noted. No erythema.  Nursing note and vitals reviewed.   ED Course  Procedures (including critical care time) Labs Review Labs Reviewed - No data to display  Imaging Review No results found. I have personally reviewed and evaluated these images and lab results as part of my medical decision-making.   EKG Interpretation None      MDM   Final diagnoses:  Allergic conjunctivitis, bilateral   Ryan [name] presents to ED for bilateral eye redness and itching. No discharge, pain, visual changes. Per patient, hx of eczema which has improved into adulthood and well controlled with Eucerin. On exam, patient is afebrile, well-appearing, and hemodynamically stable. Findings are consistent with allergic etiology. Will treat with Pataday and Zyrtec. Also encouraged warm compresses. Discussed the importance of following up with the primary care physician. Patient states he has a physician that has been recommended  to him and he will try to call Monday to schedule appointment. Informed patient of the number on discharge instructions to call if that does not work out. Return precautions were discussed and all questions answered.  [facility] Bryan Ward, PA-C 09/15/15 1702  Virgel Manifold, MD 09/15/15 2109

## 2015-12-25 DIAGNOSIS — Z940 Kidney transplant status: Secondary | ICD-10-CM | POA: Diagnosis not present

## 2015-12-25 DIAGNOSIS — E559 Vitamin D deficiency, unspecified: Secondary | ICD-10-CM | POA: Diagnosis not present

## 2015-12-25 DIAGNOSIS — E785 Hyperlipidemia, unspecified: Secondary | ICD-10-CM | POA: Diagnosis not present

## 2015-12-25 DIAGNOSIS — E1129 Type 2 diabetes mellitus with other diabetic kidney complication: Secondary | ICD-10-CM | POA: Diagnosis not present

## 2016-01-08 DIAGNOSIS — E139 Other specified diabetes mellitus without complications: Secondary | ICD-10-CM | POA: Diagnosis not present

## 2016-03-03 ENCOUNTER — Emergency Department (HOSPITAL_COMMUNITY): Payer: Medicare Other

## 2016-03-03 ENCOUNTER — Encounter (HOSPITAL_COMMUNITY): Payer: Self-pay

## 2016-03-03 ENCOUNTER — Inpatient Hospital Stay (HOSPITAL_COMMUNITY)
Admission: EM | Admit: 2016-03-03 | Discharge: 2016-03-05 | DRG: 698 | Disposition: A | Discharge status: Other Acute Inpatient Hospital | Payer: Medicare Other | Attending: Internal Medicine | Admitting: Internal Medicine

## 2016-03-03 DIAGNOSIS — Z7984 Long term (current) use of oral hypoglycemic drugs: Secondary | ICD-10-CM | POA: Diagnosis not present

## 2016-03-03 DIAGNOSIS — T8611 Kidney transplant rejection: Principal | ICD-10-CM | POA: Diagnosis present

## 2016-03-03 DIAGNOSIS — I120 Hypertensive chronic kidney disease with stage 5 chronic kidney disease or end stage renal disease: Secondary | ICD-10-CM | POA: Diagnosis present

## 2016-03-03 DIAGNOSIS — R9431 Abnormal electrocardiogram [ECG] [EKG]: Secondary | ICD-10-CM

## 2016-03-03 DIAGNOSIS — E785 Hyperlipidemia, unspecified: Secondary | ICD-10-CM | POA: Diagnosis present

## 2016-03-03 DIAGNOSIS — E872 Acidosis: Secondary | ICD-10-CM | POA: Diagnosis present

## 2016-03-03 DIAGNOSIS — N186 End stage renal disease: Secondary | ICD-10-CM | POA: Diagnosis present

## 2016-03-03 DIAGNOSIS — D631 Anemia in chronic kidney disease: Secondary | ICD-10-CM | POA: Diagnosis present

## 2016-03-03 DIAGNOSIS — E875 Hyperkalemia: Secondary | ICD-10-CM | POA: Diagnosis not present

## 2016-03-03 DIAGNOSIS — D6959 Other secondary thrombocytopenia: Secondary | ICD-10-CM | POA: Diagnosis not present

## 2016-03-03 DIAGNOSIS — I499 Cardiac arrhythmia, unspecified: Secondary | ICD-10-CM | POA: Diagnosis not present

## 2016-03-03 DIAGNOSIS — N19 Unspecified kidney failure: Secondary | ICD-10-CM

## 2016-03-03 DIAGNOSIS — Z7982 Long term (current) use of aspirin: Secondary | ICD-10-CM | POA: Diagnosis not present

## 2016-03-03 DIAGNOSIS — Z940 Kidney transplant status: Secondary | ICD-10-CM

## 2016-03-03 DIAGNOSIS — I10 Essential (primary) hypertension: Secondary | ICD-10-CM

## 2016-03-03 DIAGNOSIS — Z79899 Other long term (current) drug therapy: Secondary | ICD-10-CM

## 2016-03-03 DIAGNOSIS — T8610 Unspecified complication of kidney transplant: Secondary | ICD-10-CM | POA: Diagnosis not present

## 2016-03-03 DIAGNOSIS — E1122 Type 2 diabetes mellitus with diabetic chronic kidney disease: Secondary | ICD-10-CM

## 2016-03-03 DIAGNOSIS — N185 Chronic kidney disease, stage 5: Secondary | ICD-10-CM

## 2016-03-03 DIAGNOSIS — Z23 Encounter for immunization: Secondary | ICD-10-CM | POA: Diagnosis not present

## 2016-03-03 DIAGNOSIS — Y830 Surgical operation with transplant of whole organ as the cause of abnormal reaction of the patient, or of later complication, without mention of misadventure at the time of the procedure: Secondary | ICD-10-CM | POA: Diagnosis not present

## 2016-03-03 DIAGNOSIS — N179 Acute kidney failure, unspecified: Secondary | ICD-10-CM | POA: Diagnosis not present

## 2016-03-03 DIAGNOSIS — N289 Disorder of kidney and ureter, unspecified: Secondary | ICD-10-CM | POA: Diagnosis not present

## 2016-03-03 HISTORY — DX: End stage renal disease: N18.6

## 2016-03-03 LAB — BASIC METABOLIC PANEL
ANION GAP: 18 — AB (ref 5–15)
Anion gap: 14 (ref 5–15)
BUN: 130 mg/dL — ABNORMAL HIGH (ref 6–20)
BUN: 130 mg/dL — ABNORMAL HIGH (ref 6–20)
CALCIUM: 9.6 mg/dL (ref 8.9–10.3)
CO2: 12 mmol/L — ABNORMAL LOW (ref 22–32)
CO2: 9 mmol/L — ABNORMAL LOW (ref 22–32)
Calcium: 9.6 mg/dL (ref 8.9–10.3)
Chloride: 107 mmol/L (ref 101–111)
Chloride: 107 mmol/L (ref 101–111)
Creatinine, Ser: 11.16 mg/dL — ABNORMAL HIGH (ref 0.61–1.24)
Creatinine, Ser: 11 mg/dL — ABNORMAL HIGH (ref 0.61–1.24)
GFR calc non Af Amer: 5 mL/min — ABNORMAL LOW (ref >60)
GFR, EST AFRICAN AMERICAN: 5 mL/min — AB (ref >60)
GFR, EST AFRICAN AMERICAN: 5 mL/min — AB (ref >60)
GFR, EST NON AFRICAN AMERICAN: 5 mL/min — AB (ref >60)
Glucose, Bld: 118 mg/dL — ABNORMAL HIGH (ref 65–99)
Glucose, Bld: 143 mg/dL — ABNORMAL HIGH (ref 65–99)
POTASSIUM: 5.7 mmol/L — AB (ref 3.5–5.1)
SODIUM: 134 mmol/L — AB (ref 135–145)
Sodium: 133 mmol/L — ABNORMAL LOW (ref 135–145)

## 2016-03-03 LAB — I-STAT VENOUS BLOOD GAS, ED
Acid-base deficit: 15 mmol/L — ABNORMAL HIGH (ref 0.0–2.0)
Bicarbonate: 10.9 mmol/L — ABNORMAL LOW (ref 20.0–28.0)
O2 Saturation: 69 %
PCO2 VEN: 27.2 mmHg — AB (ref 44.0–60.0)
PH VEN: 7.21 — AB (ref 7.250–7.430)
TCO2: 12 mmol/L (ref 0–100)
pO2, Ven: 43 mmHg (ref 32.0–45.0)

## 2016-03-03 LAB — CBC
HCT: 38.8 % — ABNORMAL LOW (ref 39.0–52.0)
HEMOGLOBIN: 13 g/dL (ref 13.0–17.0)
MCH: 25 pg — AB (ref 26.0–34.0)
MCHC: 33.5 g/dL (ref 30.0–36.0)
MCV: 74.5 fL — ABNORMAL LOW (ref 78.0–100.0)
Platelets: 533 10*3/uL — ABNORMAL HIGH (ref 150–400)
RBC: 5.21 MIL/uL (ref 4.22–5.81)
RDW: 15.5 % (ref 11.5–15.5)
WBC: 9.3 10*3/uL (ref 4.0–10.5)

## 2016-03-03 LAB — CBG MONITORING, ED: GLUCOSE-CAPILLARY: 109 mg/dL — AB (ref 65–99)

## 2016-03-03 LAB — GLUCOSE, RANDOM: Glucose, Bld: 117 mg/dL — ABNORMAL HIGH (ref 65–99)

## 2016-03-03 MED ORDER — CLONIDINE HCL 0.1 MG PO TABS
0.2000 mg | ORAL_TABLET | Freq: Two times a day (BID) | ORAL | Status: DC
  Administered 2016-03-04 (×3): 0.2 mg via ORAL
  Filled 2016-03-03 (×3): qty 2

## 2016-03-03 MED ORDER — DEXTROSE 50 % IV SOLN
1.0000 | Freq: Once | INTRAVENOUS | Status: AC
  Administered 2016-03-03: 50 mL via INTRAVENOUS
  Filled 2016-03-03: qty 50

## 2016-03-03 MED ORDER — ALBUTEROL SULFATE (2.5 MG/3ML) 0.083% IN NEBU
10.0000 mg | INHALATION_SOLUTION | Freq: Once | RESPIRATORY_TRACT | Status: AC
  Administered 2016-03-03: 10 mg via RESPIRATORY_TRACT
  Filled 2016-03-03: qty 12

## 2016-03-03 MED ORDER — DEXTROSE 5 % IV SOLN: Freq: Once | INTRAVENOUS | Status: DC

## 2016-03-03 MED ORDER — TACROLIMUS 1 MG PO CAPS
7.0000 mg | ORAL_CAPSULE | Freq: Two times a day (BID) | ORAL | Status: DC
  Administered 2016-03-04 (×3): 7 mg via ORAL
  Filled 2016-03-03 (×3): qty 7

## 2016-03-03 MED ORDER — SODIUM CHLORIDE 0.9% FLUSH
3.0000 mL | Freq: Two times a day (BID) | INTRAVENOUS | Status: DC
  Administered 2016-03-04 (×3): 3 mL via INTRAVENOUS

## 2016-03-03 MED ORDER — STERILE WATER FOR INJECTION IV SOLN: INTRAVENOUS | Status: DC

## 2016-03-03 MED ORDER — HEPARIN SODIUM (PORCINE) 5000 UNIT/ML IJ SOLN
5000.0000 [IU] | Freq: Three times a day (TID) | INTRAMUSCULAR | Status: DC
  Administered 2016-03-04 (×2): 5000 [IU] via SUBCUTANEOUS
  Filled 2016-03-03 (×2): qty 1

## 2016-03-03 MED ORDER — SODIUM BICARBONATE 8.4 % IV SOLN
50.0000 meq | Freq: Once | INTRAVENOUS | Status: AC
  Administered 2016-03-03: 50 meq via INTRAVENOUS
  Filled 2016-03-03: qty 50

## 2016-03-03 MED ORDER — SODIUM POLYSTYRENE SULFONATE 15 GM/60ML PO SUSP
30.0000 g | Freq: Once | ORAL | Status: AC
  Administered 2016-03-03: 30 g via ORAL
  Filled 2016-03-03: qty 120

## 2016-03-03 MED ORDER — ONDANSETRON 4 MG PO TBDP
ORAL_TABLET | ORAL | Status: AC
  Administered 2016-03-03: 4 mg
  Filled 2016-03-03: qty 1

## 2016-03-03 MED ORDER — NIFEDIPINE ER 60 MG PO TB24: 60.0000 mg | ORAL_TABLET | Freq: Two times a day (BID) | ORAL | Status: DC

## 2016-03-03 MED ORDER — SODIUM BICARBONATE 8.4 % IV SOLN
Freq: Once | INTRAVENOUS | Status: AC
  Administered 2016-03-03: 20:00:00 via INTRAVENOUS
  Filled 2016-03-03: qty 150

## 2016-03-03 MED ORDER — ASPIRIN 325 MG PO TABS
325.0000 mg | ORAL_TABLET | Freq: Every day | ORAL | Status: DC
  Administered 2016-03-04: 325 mg via ORAL
  Filled 2016-03-03: qty 1

## 2016-03-03 MED ORDER — ONDANSETRON 4 MG PO TBDP
4.0000 mg | ORAL_TABLET | Freq: Once | ORAL | Status: DC | PRN
  Filled 2016-03-03: qty 1

## 2016-03-03 MED ORDER — INSULIN ASPART 100 UNIT/ML IV SOLN
10.0000 [IU] | Freq: Once | INTRAVENOUS | Status: AC
  Administered 2016-03-03: 10 [IU] via INTRAVENOUS
  Filled 2016-03-03 (×2): qty 0.1

## 2016-03-03 MED ORDER — MYCOPHENOLATE SODIUM 180 MG PO TBEC
720.0000 mg | DELAYED_RELEASE_TABLET | Freq: Two times a day (BID) | ORAL | Status: DC
  Administered 2016-03-04 (×3): 720 mg via ORAL
  Filled 2016-03-03 (×3): qty 4

## 2016-03-03 MED ORDER — CALCIUM GLUCONATE 10 % IV SOLN
1.0000 g | Freq: Once | INTRAVENOUS | Status: AC
  Administered 2016-03-03: 1 g via INTRAVENOUS
  Filled 2016-03-03: qty 10

## 2016-03-03 MED ORDER — PRAVASTATIN SODIUM 40 MG PO TABS
80.0000 mg | ORAL_TABLET | Freq: Every day | ORAL | Status: DC
  Administered 2016-03-04: 80 mg via ORAL
  Filled 2016-03-03: qty 2

## 2016-03-03 MED ORDER — METHYLPREDNISOLONE SODIUM SUCC 125 MG IJ SOLR
125.0000 mg | Freq: Every day | INTRAMUSCULAR | Status: DC
  Administered 2016-03-04: 125 mg via INTRAVENOUS
  Filled 2016-03-03: qty 2

## 2016-03-03 NOTE — H&P (Signed)
History and Physical    Ryan [name] [id number] DOB: 27-Feb-1961 DOA: 03/03/2016   PCP: No PCP Per Patient Chief Complaint:  Chief Complaint  Patient presents with  . Nausea    HPI: Ryan [name] is a 55 y.o. male with medical history significant of Kidney transplant either in 2008 or 2010.  Was done at [facility].  Patient lost coverage in April this year for immunosuppressant therapy.  Ran out of prograf in August.  He states that he went to [facility] in November about a month ago for some periorbital edema and transplant labs were drawn at that time. He can not recall his exact creatinine but stated that it was about baseline. His baseline renal function he states is close to the normal range with no rejections and infections. He denies allograft tenderness although over the last 1 -2 weeks he has had nausea and no vomiting and no lower extremity swelling or dyspnea.  ED Course: Creat 11, BUN 130, K > 7.5, bicarb 12.  EKG tall peaked T waves.  Review of Systems: As per HPI otherwise 10 point review of systems negative.    Past Medical History:  Diagnosis Date  . DM2 (diabetes mellitus, type 2) ([place])   . HTN (hypertension)   . Kidney transplant as cause of abnormal reaction or later complication     Past Surgical History:  Procedure Laterality Date  . KIDNEY TRANSPLANT       reports that he has never smoked. He has never used smokeless tobacco. He reports that he does not drink alcohol or use drugs.  No Known Allergies  No family history on file. No family history of transplant rejection.   Prior to Admission medications   Medication Sig Start Date End Date Taking? Authorizing Provider  aspirin 325 MG tablet Take 325 mg by mouth every morning.   Yes Historical Provider, MD  Cholecalciferol (VITAMIN D3) 10000 units capsule Take 10,000 Units by mouth daily.   Yes Historical Provider, MD  cloNIDine (CATAPRES) 0.2 MG tablet Take 0.2 mg by mouth 2 (two) times  daily. 08/24/15  Yes Historical Provider, MD  enalapril (VASOTEC) 10 MG tablet Take 10 mg by mouth daily. 07/13/15  Yes Historical Provider, MD  metFORMIN (GLUCOPHAGE) 500 MG tablet Take 500 mg by mouth daily. 07/31/15  Yes Historical Provider, MD  mycophenolate (MYFORTIC) 180 MG EC tablet Take 720 mg by mouth 2 (two) times daily.  07/24/15  Yes Historical Provider, MD  NIFEdipine (PROCARDIA-XL/ADALAT CC) 60 MG 24 hr tablet Take 60 mg by mouth 2 (two) times daily. 07/31/15  Yes Historical Provider, MD  Omega-3 Fatty Acids (FISH OIL) 1000 MG CAPS Take 1,000 mg by mouth daily.   Yes Historical Provider, MD  pravastatin (PRAVACHOL) 80 MG tablet Take 80 mg by mouth every morning. 08/16/15  Yes Historical Provider, MD  PROGRAF 1 MG capsule Take 7 mg by mouth 2 (two) times daily. 07/26/15  Yes Historical Provider, MD    Physical Exam: Vitals:   03/03/16 1730 03/03/16 1745 03/03/16 1908 03/03/16 1930  BP: 126/88 132/84  135/86  Pulse: 91 95  118  Resp: 21 20  18   Temp:      TempSrc:      SpO2: 96% 93% 96% 94%  Weight:      Height:          Constitutional: NAD, calm, comfortable Eyes: PERRL, lids and conjunctivae normal ENMT: Mucous membranes are moist. Posterior pharynx clear of any exudate or  lesions.Normal dentition.  Neck: normal, supple, no masses, no thyromegaly Respiratory: clear to auscultation bilaterally, no wheezing, no crackles. Normal respiratory effort. No accessory muscle use.  Cardiovascular: Regular rate and rhythm, no murmurs / rubs / gallops. No extremity edema. 2+ pedal pulses. No carotid bruits.  Abdomen: no tenderness, no masses palpated. No hepatosplenomegaly. Bowel sounds positive.  Musculoskeletal: no clubbing / cyanosis. No joint deformity upper and lower extremities. Good ROM, no contractures. Normal muscle tone.  Skin: no rashes, lesions, ulcers. No induration Neurologic: CN 2-12 grossly intact. Sensation intact, DTR normal. Strength 5/5 in all 4.  Psychiatric: Normal  judgment and insight. Alert and oriented x 3. Normal mood.    Labs on Admission: I have personally reviewed following labs and imaging studies  CBC:  Recent Labs Lab 03/03/16 1605  WBC 9.3  HGB 13.0  HCT 38.8*  MCV 74.5*  PLT 761*   Basic Metabolic Panel:  Recent Labs Lab 03/03/16 1605  NA 133*  K >7.5*  CL 107  CO2 12*  GLUCOSE 118*  BUN 130*  CREATININE 11.16*  CALCIUM 9.6   GFR: Estimated Creatinine Clearance: 7.3 mL/min (by C-G formula based on SCr of 11.16 mg/dL (H)). Liver Function Tests: No results for input(s): AST, ALT, ALKPHOS, BILITOT, PROT, ALBUMIN in the last 168 hours. No results for input(s): LIPASE, AMYLASE in the last 168 hours. No results for input(s): AMMONIA in the last 168 hours. Coagulation Profile: No results for input(s): INR, PROTIME in the last 168 hours. Cardiac Enzymes: No results for input(s): CKTOTAL, CKMB, CKMBINDEX, TROPONINI in the last 168 hours. BNP (last 3 results) No results for input(s): PROBNP in the last 8760 hours. HbA1C: No results for input(s): HGBA1C in the last 72 hours. CBG:  Recent Labs Lab 03/03/16 1907  GLUCAP 109*   Lipid Profile: No results for input(s): CHOL, HDL, LDLCALC, TRIG, CHOLHDL, LDLDIRECT in the last 72 hours. Thyroid Function Tests: No results for input(s): TSH, T4TOTAL, FREET4, T3FREE, THYROIDAB in the last 72 hours. Anemia Panel: No results for input(s): VITAMINB12, FOLATE, FERRITIN, TIBC, IRON, RETICCTPCT in the last 72 hours. Urine analysis: No results found for: COLORURINE, APPEARANCEUR, LABSPEC, PHURINE, GLUCOSEU, HGBUR, BILIRUBINUR, KETONESUR, PROTEINUR, UROBILINOGEN, NITRITE, LEUKOCYTESUR Sepsis Labs: @LABRCNTIP (procalcitonin:4,lacticidven:4) )No results found for this or any previous visit (from the past 240 hour(s)).   Radiological Exams on Admission: No results found.  EKG: Independently reviewed.  Assessment/Plan Principal Problem:   Acute renal transplant rejection Active  Problems:   Hyperkalemia   Acute kidney failure (HCC)   Acute uremia   Metabolic acidosis, normal anion gap (NAG)    1. Renal transplant rejection - Per Dr. Justin Mend treat as acute and give benefit of doubt 1. Solumedrol 125 IV daily first dose now 2. Resume rejection meds 3. Get prograf level but presumably will be undetectably low since last dose was in Aug 4. [place] of renal transplant 5. If K stabilized, plan to transfer patient to [place] tomorrow per Dr. Justin Mend 6. UA pending 2. Hyperkalemia - 1. Kayexelate, bicarb gtt, tele monitor, calcium 2. Repeat K at 9pm call Justin Mend with results 3. AKF - due to #1 4. Acute uremia - due to #3 5. NAG metabolic acidosis - Due to #3 6. DM2 - 1. Holding metformin 7. HTN - holding CCB, continue clonadine   DVT prophylaxis: Heparin [place] Code Status: Full Family Communication: Son is at bedside Consults called: Dr. Justin Mend Admission status: Admit to inpatient   [name] DO [facility] Pager [phone]  from 7PM-7AM  If 7AM-7PM, please contact the day physician for the patient [email or web address] Password [facility]  03/03/2016, 7:49 PM

## 2016-03-03 NOTE — ED Triage Notes (Signed)
Per Pt, Pt is coming from home with complaints of nausea and generalized fatigue for the past two days. Pt denies Vomiting. Complains of dry cough and some diarrhea today. Hx of Kidney transplant. Denies urinary symptoms.

## 2016-03-03 NOTE — ED Notes (Signed)
Pt's CBG result was 109. Informed Gabe - RN.

## 2016-03-03 NOTE — ED Provider Notes (Signed)
[facility] DEPT Provider Note   CSN: [id number] Arrival date & time: 03/03/16  1455     History   Chief Complaint Chief Complaint  Patient presents with  . Nausea    HPI Ryan [name] is a 55 y.o. male.  HPI  Patient is a 55 year old male with past medical history significant for renal transplant several years ago at [facility] who presents with a week and a half of generalized fatigue and nausea. Reports severe fatigue to the point he does not want to get out of bed. Worsening nausea and loss of appetite, but no vomiting. Denies fevers, chills, SOB, chest pain, changes in bowel movements, abdominal pain, specifically denies pain over graft site, or lower extremity swelling. He has continued to make urine and denies dysuria or hematuria. Reports some increased urinary frequency. Patient reports he was on immunosuppressant therapy until August of this year when he lost his insurance coverage. He stopped taking Prograf at that time. He reports he's been taking half his normal dose of myfortic to try and extend the amount he has left. Patient reports he was evaluated about a month ago in [place] with transplant labs that were unremarkable.  Past Medical History:  Diagnosis Date  . Kidney transplant as cause of abnormal reaction or later complication     There are no active problems to display for this patient.   Past Surgical History:  Procedure Laterality Date  . KIDNEY TRANSPLANT         Home Medications    Prior to Admission medications   Medication Sig Start Date End Date Taking? Authorizing Provider  aspirin 325 MG tablet Take 325 mg by mouth every morning.   Yes Historical Provider, MD  Cholecalciferol (VITAMIN D3) 10000 units capsule Take 10,000 Units by mouth daily.   Yes Historical Provider, MD  cloNIDine (CATAPRES) 0.2 MG tablet Take 0.2 mg by mouth 2 (two) times daily. 08/24/15  Yes Historical Provider, MD  enalapril (VASOTEC) 10 MG tablet Take  10 mg by mouth daily. 07/13/15  Yes Historical Provider, MD  metFORMIN (GLUCOPHAGE) 500 MG tablet Take 500 mg by mouth daily. 07/31/15  Yes Historical Provider, MD  mycophenolate (MYFORTIC) 180 MG EC tablet Take 720 mg by mouth 2 (two) times daily.  07/24/15  Yes Historical Provider, MD  NIFEdipine (PROCARDIA-XL/ADALAT CC) 60 MG 24 hr tablet Take 60 mg by mouth 2 (two) times daily. 07/31/15  Yes Historical Provider, MD  Omega-3 Fatty Acids (FISH OIL) 1000 MG CAPS Take 1,000 mg by mouth daily.   Yes Historical Provider, MD  pravastatin (PRAVACHOL) 80 MG tablet Take 80 mg by mouth every morning. 08/16/15  Yes Historical Provider, MD  PROGRAF 1 MG capsule Take 7 mg by mouth 2 (two) times daily. 07/26/15  Yes Historical Provider, MD  cetirizine (ZYRTEC) 10 MG tablet Take 1 tablet (10 mg total) by mouth daily. Patient not taking: Reported on 03/03/2016 09/15/15   [facility] Ward, PA-C  Olopatadine HCl (PATADAY) 0.2 % SOLN Apply 1 drop to eye daily. Patient not taking: Reported on 03/03/2016 09/15/15   Ozella Almond Ward, PA-C    Family History No family history on file.  Social History Social History  Substance Use Topics  . Smoking status: Never Smoker  . Smokeless tobacco: Never Used  . Alcohol use No     Allergies   Patient has no known allergies.   Review of Systems Review of Systems  Constitutional: Positive for fatigue. Negative for chills and  fever.  HENT: Negative for ear pain and sore throat.   Eyes: Negative for pain and visual disturbance.  Respiratory: Negative for cough and shortness of breath.   Cardiovascular: Negative for chest pain and palpitations.  Gastrointestinal: Positive for nausea. Negative for abdominal pain and vomiting.  Genitourinary: Positive for frequency. Negative for dysuria and hematuria.  Musculoskeletal: Negative for arthralgias and back pain.  Skin: Negative for color change and rash.  Neurological: Negative for seizures and syncope.  All other systems  reviewed and are negative.    Physical Exam Updated Vital Signs BP 123/81   Pulse 96   Temp 98.1 F (36.7 C) (Oral)   Resp 20   Ht 5' (1.524 m)   Wt 95.2 kg   SpO2 97%   BMI 40.97 kg/m   Physical Exam  Constitutional: He appears well-developed and well-nourished.  HENT:  Head: Normocephalic and atraumatic.  Eyes: Conjunctivae are normal.  Neck: Neck supple.  Cardiovascular: Normal rate and regular rhythm.   No murmur heard. Pulmonary/Chest: Effort normal and breath sounds normal. No respiratory distress.  Abdominal: Soft. There is no tenderness. There is no rigidity, no rebound, no guarding and no CVA tenderness.  Musculoskeletal: He exhibits no edema.  Neurological: He is alert.  Skin: Skin is warm and dry.  Psychiatric: He has a normal mood and affect.  Nursing note and vitals reviewed.    ED Treatments / Results  Labs (all labs ordered are listed, but only abnormal results are displayed) Labs Reviewed  BASIC METABOLIC PANEL - Abnormal; Notable for the following:       Result Value   Sodium 133 (*)    Potassium >7.5 (*)    CO2 12 (*)    Glucose, Bld 118 (*)    BUN 130 (*)    Creatinine, Ser 11.16 (*)    GFR calc non Af Amer 5 (*)    GFR calc Af Amer 5 (*)    All other components within normal limits  CBC - Abnormal; Notable for the following:    HCT 38.8 (*)    MCV 74.5 (*)    MCH 25.0 (*)    Platelets 533 (*)    All other components within normal limits  GLUCOSE, RANDOM - Abnormal; Notable for the following:    Glucose, Bld 117 (*)    All other components within normal limits  BASIC METABOLIC PANEL - Abnormal; Notable for the following:    Sodium 134 (*)    Potassium 5.7 (*)    CO2 9 (*)    Glucose, Bld 143 (*)    BUN 130 (*)    Creatinine, Ser 11.00 (*)    GFR calc non Af Amer 5 (*)    GFR calc Af Amer 5 (*)    Anion gap 18 (*)    All other components within normal limits  CBG MONITORING, ED - Abnormal; Notable for the following:     Glucose-Capillary 109 (*)    All other components within normal limits  I-STAT VENOUS BLOOD GAS, ED - Abnormal; Notable for the following:    pH, Ven 7.210 (*)    pCO2, Ven 27.2 (*)    Bicarbonate 10.9 (*)    Acid-base deficit 15.0 (*)    All other components within normal limits  URINALYSIS, ROUTINE W REFLEX MICROSCOPIC  BASIC METABOLIC PANEL  TACROLIMUS LEVEL  BASIC METABOLIC PANEL    EKG  EKG Interpretation  Date/Time:  Monday March 03 2016 17:29:36 EST Ventricular Rate:  90 PR Interval:    QRS Duration: 97 QT Interval:  327 QTC Calculation: 400 R Axis:   76 Text Interpretation:  Sinus rhythm Probable anteroseptal infarct, recent hyperacute T waves No significant change since last tracing Confirmed by NANAVATI, MD, Thelma Comp [phone]) on 03/03/2016 7:07:35 PM       Radiology US Renal Transplant W/doppler  Procedures Procedures (including critical care time)  Medications Ordered in ED Medications  ondansetron (ZOFRAN-ODT) disintegrating tablet 4 mg (not administered)  calcium gluconate inj 10% (1 g) URGENT USE ONLY! (not administered)  insulin aspart (novoLOG) injection 10 Units (not administered)  dextrose 50 % solution 50 mL (not administered)  albuterol (PROVENTIL) (2.5 MG/3ML) 0.083% nebulizer solution 10 mg (not administered)  sodium bicarbonate injection 50 mEq (not administered)  ondansetron (ZOFRAN-ODT) 4 MG disintegrating tablet (4 mg  Given 03/03/16 1622)     Initial Impression / Assessment and Plan / ED Course  I have reviewed the triage vital signs and the nursing notes.  Pertinent labs & imaging results that were available during my care of the patient were reviewed by me and considered in my medical decision making (see chart for details).  Clinical Course    Patient is a 55 year old male with past medical history of renal transplant as described above who presents with weakness for fatigue and nausea. Patient is afebrile, normotensive, mildly  tachycardic. Exam without signs of overt fluid overload. No tenderness to palpation over renal transplant site. EKG shows hyperacute T waves concerning for hyperkalemia, without QRS widening. Patient is hyperkalemic, with potassium > 7.5 and acidotic bicarbonate 12 on initial labs. BUN/Cr significantly elevated. Patient was temporized with IV calcium, insulin/D50, albuterol and bicarbonate. Nephrology, Dr. Justin Mend, was consulted to evaluate patient for acute renal failure and likely graft rejection. Please refer to his note for full evaluation and recommendations. Briefly recommended, kayexalate and bicarbonate infusion now for hyperkalemia and acidosis. Will follow-up repeat labs and determine need for acute dialysis. Patient will likely require transfer to [facility] when stable for biopsy. Hospitalist was consulted for admission and triaged patient. Will be admitted to the stepdown unit for further management. Repeat potassium 5.7 after temporizing measures. Patient remains HDS.   Patient seen and discussed with Dr. Kathrynn Humble, ED attending   Final Clinical Impressions(s) / ED Diagnoses   Final diagnoses:  Acute renal failure, unspecified acute renal failure type ([place])  Acute hyperkalemia  EKG abnormalities  Uremia    New Prescriptions New Prescriptions   No medications on file     Gibson Ramp, MD 03/04/16 [place], MD 03/04/16 [phone]

## 2016-03-03 NOTE — Consult Note (Signed)
Referring Provider: No ref. provider found Primary Care Physician:  No PCP Per Patient Primary Nephrologist:  Dr. Lorrene Reid  Reason for Consultation:  Acute Renal Failure Anemia and Hyperkalemia and Metabolic Acidosis  HPI:  This is a 55 year old man who underwent a renal transplant with a living unrelated donor in 2008 at [facility]. He states that he was doing well until April of this year when he lost coverage and benefits for his immunosuppressant therapy. He states that he went to [facility] in November about a month ago for some periorbital edema and transplant labs were drawn at that time. He can not recall his exact creatinine but stated that it was about baseline. His baseline renal function he states is close to the normal range with no rejections and infections. He denies allograft tenderness although over the last 1 -2 weeks he has had nausea and no vomiting and no lower extremity swelling or dyspnea  Past Medical History:  Diagnosis Date  . Kidney transplant as cause of abnormal reaction or later complication     Past Surgical History:  Procedure Laterality Date  . KIDNEY TRANSPLANT      Prior to Admission medications   Medication Sig Start Date End Date Taking? Authorizing Provider  aspirin 325 MG tablet Take 325 mg by mouth every morning.   Yes Historical Provider, MD  Cholecalciferol (VITAMIN D3) 10000 units capsule Take 10,000 Units by mouth daily.   Yes Historical Provider, MD  cloNIDine (CATAPRES) 0.2 MG tablet Take 0.2 mg by mouth 2 (two) times daily. 08/24/15  Yes Historical Provider, MD  enalapril (VASOTEC) 10 MG tablet Take 10 mg by mouth daily. 07/13/15  Yes Historical Provider, MD  metFORMIN (GLUCOPHAGE) 500 MG tablet Take 500 mg by mouth daily. 07/31/15  Yes Historical Provider, MD  mycophenolate (MYFORTIC) 180 MG EC tablet Take 720 mg by mouth 2 (two) times daily.  07/24/15  Yes Historical Provider, MD  NIFEdipine (PROCARDIA-XL/ADALAT CC) 60 MG 24 hr tablet Take 60  mg by mouth 2 (two) times daily. 07/31/15  Yes Historical Provider, MD  Omega-3 Fatty Acids (FISH OIL) 1000 MG CAPS Take 1,000 mg by mouth daily.   Yes Historical Provider, MD  pravastatin (PRAVACHOL) 80 MG tablet Take 80 mg by mouth every morning. 08/16/15  Yes Historical Provider, MD  PROGRAF 1 MG capsule Take 7 mg by mouth 2 (two) times daily. 07/26/15  Yes Historical Provider, MD    Current Facility-Administered Medications  Medication Dose Route Frequency Provider Last Rate Last Dose  . ondansetron (ZOFRAN-ODT) disintegrating tablet 4 mg  4 mg Oral Once PRN Varney Biles, MD      . sodium bicarbonate 150 mEq in dextrose 5 % 1,000 mL infusion   Intravenous Once Gibson Ramp, MD       Current Outpatient Prescriptions  Medication Sig Dispense Refill  . aspirin 325 MG tablet Take 325 mg by mouth every morning.    . Cholecalciferol (VITAMIN D3) 10000 units capsule Take 10,000 Units by mouth daily.    . cloNIDine (CATAPRES) 0.2 MG tablet Take 0.2 mg by mouth 2 (two) times daily.  3  . enalapril (VASOTEC) 10 MG tablet Take 10 mg by mouth daily.  3  . metFORMIN (GLUCOPHAGE) 500 MG tablet Take 500 mg by mouth daily.  3  . mycophenolate (MYFORTIC) 180 MG EC tablet Take 720 mg by mouth 2 (two) times daily.   5  . NIFEdipine (PROCARDIA-XL/ADALAT CC) 60 MG 24 hr tablet Take  60 mg by mouth 2 (two) times daily.  3  . Omega-3 Fatty Acids (FISH OIL) 1000 MG CAPS Take 1,000 mg by mouth daily.    . pravastatin (PRAVACHOL) 80 MG tablet Take 80 mg by mouth every morning.  2  . PROGRAF 1 MG capsule Take 7 mg by mouth 2 (two) times daily.  3    Allergies as of 03/03/2016  . (No Known Allergies)    No family history on file.  Social History   Social History  . Marital status: Single    Spouse name: N/A  . Number of children: N/A  . Years of education: N/A   Occupational History  . Not on file.   Social History Main Topics  . Smoking status: Never Smoker  . Smokeless tobacco: Never Used  .  Alcohol use No  . Drug use: No  . Sexual activity: Not on file   Other Topics Concern  . Not on file   Social History Narrative  . No narrative on file    Review of Systems: Gen: Denies any fever, chills, sweats, anorexia, fatigue, weakness, malaise, weight loss, and sleep disorder HEENT: No visual complaints, No history of Retinopathy. Normal external appearance No Epistaxis or Sore throat. No sinusitis.   CV: Denies chest pain, angina, palpitations, syncope, orthopnea, PND, peripheral edema, and claudication. Resp: Denies dyspnea at rest, dyspnea with exercise, cough, sputum, wheezing, coughing up blood, and pleurisy. GI: Admits to nausea and poor appetite for 2 weeks  GU : Denies urinary burning, blood in urine, urinary frequency, urinary hesitancy, nocturnal urination, and urinary incontinence.  No renal calculi. MS: Denies joint pain, limitation of movement, and swelling, stiffness, low back pain, extremity pain. Denies muscle weakness, cramps, atrophy.  No use of non steroidal antiinflammatory drugs. Derm: Denies rash, itching, dry skin, hives, moles, warts, or unhealing ulcers.  Psych: Denies depression, anxiety, memory loss, suicidal ideation, hallucinations, paranoia, and confusion. Heme: Denies bruising, bleeding, and enlarged lymph nodes. Neuro: No headache.  No diplopia. No dysarthria.  No dysphasia.  No history of CVA.  No Seizures. No paresthesias.  No weakness. Endocrine No DM.  No Thyroid disease.  No Adrenal disease.  Physical Exam: Vital signs in last 24 hours: Temp:  [97.7 F (36.5 C)-98.1 F (36.7 C)] 98.1 F (36.7 C) (12/11 1702) Pulse Rate:  [91-118] 118 (12/11 1930) Resp:  [17-21] 18 (12/11 1930) BP: (118-135)/(81-88) 135/86 (12/11 1930) SpO2:  [93 %-98 %] 94 % (12/11 1930) Weight:  [95.2 kg (209 lb 12.8 oz)] 95.2 kg (209 lb 12.8 oz) (12/11 1544)   General:   Alert,  Well-developed, well-nourished, pleasant and cooperative in NAD Head:  Normocephalic and  atraumatic. Eyes:  Sclera clear, no icterus.   Conjunctiva pink. Ears:  Normal auditory acuity. Nose:  No deformity, discharge,  or lesions. Mouth:  No deformity or lesions, dentition normal. Neck:  Supple; no masses or thyromegaly. JVP not elevated Lungs:  Clear throughout to auscultation.   No wheezes, crackles, or rhonchi. No acute distress. Heart:  Regular rate and rhythm; no murmurs, clicks, rubs,  or gallops. Abdomen:  Soft, nontender and nondistended. No masses, hepatosplenomegaly or hernias noted. Normal bowel sounds, without guarding, and without rebound.  No tenderness around transplant site Msk:  Symmetrical without gross deformities. Normal posture. Pulses:  No carotid, renal, femoral bruits. DP and PT symmetrical and equal Extremities:  Without clubbing or edema. Neurologic:  Alert and  oriented x4;  grossly normal neurologically. Skin:  Intact without  significant lesions or rashes. Cervical Nodes:  No significant cervical adenopathy. Psych:  Alert and cooperative. Normal mood and affect.  Intake/Output from previous day: No intake/output data recorded. Intake/Output this shift: No intake/output data recorded.  Lab Results:  Recent Labs  03/03/16 1605  WBC 9.3  HGB 13.0  HCT 38.8*  PLT 533*   BMET  Recent Labs  03/03/16 1605  NA 133*  K >7.5*  CL 107  CO2 12*  GLUCOSE 118*  BUN 130*  CREATININE 11.16*  CALCIUM 9.6   LFT No results for input(s): PROT, ALBUMIN, AST, ALT, ALKPHOS, BILITOT, BILIDIR, IBILI in the last 72 hours. PT/INR No results for input(s): LABPROT, INR in the last 72 hours. Hepatitis Panel No results for input(s): HEPBSAG, HCVAB, HEPAIGM, HEPBIGM in the last 72 hours.  Studies/Results: No results found.  Assessment/Plan:  Acute Allograft Loss ( based on history)  Loss seems related to not taking his immunosuppressants. This could also be End Stage Renal Disease.               Serial Creatinine              IV Fluids               Monitor fluid balance closely              Renal ultrasound              IV steroids  - solumedrol 125mg  daily              Will transfer to [facility] for biopsy once stable  Hypertension controlled . Has good urine output               Monitor Is and O's               IV fluids    IV bicarbonate 100- 125 cc/hr  Hyperkalemia                 Would treat with Insulin,                                            Calcium.                                            Nebulized albuterol.                                           Kayexalate                                            Sodium bicarbonate                                           Will need to follow serial potassium  May need dialysis  Metabolic Acidosis treat with sodium bicarbonate    LOS: 0 Ryan [name] W @TODAY @7 :42 PM

## 2016-03-03 NOTE — ED Notes (Signed)
Patient transported to Ultrasound 

## 2016-03-04 DIAGNOSIS — E875 Hyperkalemia: Secondary | ICD-10-CM

## 2016-03-04 DIAGNOSIS — N1339 Other hydronephrosis: Secondary | ICD-10-CM | POA: Insufficient documentation

## 2016-03-04 DIAGNOSIS — N179 Acute kidney failure, unspecified: Secondary | ICD-10-CM

## 2016-03-04 DIAGNOSIS — R9431 Abnormal electrocardiogram [ECG] [EKG]: Secondary | ICD-10-CM

## 2016-03-04 DIAGNOSIS — E872 Acidosis: Secondary | ICD-10-CM

## 2016-03-04 DIAGNOSIS — T8611 Kidney transplant rejection: Principal | ICD-10-CM

## 2016-03-04 DIAGNOSIS — N19 Unspecified kidney failure: Secondary | ICD-10-CM

## 2016-03-04 LAB — BASIC METABOLIC PANEL
ANION GAP: 17 — AB (ref 5–15)
Anion gap: 17 — ABNORMAL HIGH (ref 5–15)
BUN: 128 mg/dL — ABNORMAL HIGH (ref 6–20)
BUN: 134 mg/dL — AB (ref 6–20)
CALCIUM: 9.1 mg/dL (ref 8.9–10.3)
CALCIUM: 8.6 mg/dL — AB (ref 8.9–10.3)
CHLORIDE: 103 mmol/L (ref 101–111)
CO2: 14 mmol/L — AB (ref 22–32)
CO2: 14 mmol/L — ABNORMAL LOW (ref 22–32)
CREATININE: 11.13 mg/dL — AB (ref 0.61–1.24)
CREATININE: 11.15 mg/dL — AB (ref 0.61–1.24)
Chloride: 104 mmol/L (ref 101–111)
GFR calc non Af Amer: 5 mL/min — ABNORMAL LOW (ref >60)
GFR, EST AFRICAN AMERICAN: 5 mL/min — AB (ref >60)
GFR, EST AFRICAN AMERICAN: 5 mL/min — AB (ref >60)
GFR, EST NON AFRICAN AMERICAN: 5 mL/min — AB (ref >60)
Glucose, Bld: 99 mg/dL (ref 65–99)
Glucose, Bld: 171 mg/dL — ABNORMAL HIGH (ref 65–99)
Potassium: 5 mmol/L (ref 3.5–5.1)
Potassium: 5.3 mmol/L — ABNORMAL HIGH (ref 3.5–5.1)
SODIUM: 135 mmol/L (ref 135–145)
SODIUM: 134 mmol/L — AB (ref 135–145)

## 2016-03-04 LAB — MRSA PCR SCREENING: MRSA BY PCR: NEGATIVE

## 2016-03-04 MED ORDER — PNEUMOCOCCAL VAC POLYVALENT 25 MCG/0.5ML IJ INJ
0.5000 mL | INJECTION | INTRAMUSCULAR | Status: AC
  Administered 2016-03-04: 0.5 mL via INTRAMUSCULAR
  Filled 2016-03-04: qty 0.5

## 2016-03-04 MED ORDER — SODIUM CHLORIDE 0.9 % IV SOLN: 500.0000 mg | Freq: Every day | INTRAVENOUS | Status: DC

## 2016-03-04 MED ORDER — INFLUENZA VAC SPLIT QUAD 0.5 ML IM SUSY
0.5000 mL | PREFILLED_SYRINGE | INTRAMUSCULAR | Status: AC
  Administered 2016-03-04: 0.5 mL via INTRAMUSCULAR
  Filled 2016-03-04: qty 0.5

## 2016-03-04 MED ORDER — SODIUM CHLORIDE 0.9 % IV SOLN
500.0000 mg | Freq: Every day | INTRAVENOUS | Status: DC
  Filled 2016-03-04: qty 4

## 2016-03-04 MED ORDER — SODIUM CHLORIDE 0.9 % IV SOLN: 375.0000 mg | Freq: Once | INTRAVENOUS | Status: AC

## 2016-03-04 MED ORDER — SODIUM BICARBONATE 650 MG PO TABS: 1300.0000 mg | ORAL_TABLET | Freq: Two times a day (BID) | ORAL | Status: DC

## 2016-03-04 MED ORDER — METHYLPREDNISOLONE SODIUM SUCC 1000 MG IJ SOLR
375.0000 mg | Freq: Once | INTRAMUSCULAR | Status: AC
  Administered 2016-03-04: 380 mg via INTRAVENOUS
  Filled 2016-03-04: qty 3.04

## 2016-03-04 MED ORDER — HEPARIN SODIUM (PORCINE) 5000 UNIT/ML IJ SOLN: 5000.0000 [IU] | Freq: Three times a day (TID) | INTRAMUSCULAR | Status: DC

## 2016-03-04 MED ORDER — SODIUM BICARBONATE 650 MG PO TABS
1300.0000 mg | ORAL_TABLET | Freq: Two times a day (BID) | ORAL | Status: DC
  Administered 2016-03-04 (×2): 1300 mg via ORAL
  Filled 2016-03-04 (×2): qty 2

## 2016-03-04 NOTE — Progress Notes (Signed)
Spoke with coverage MD Forrest Moron NP concerning completion of EMTALA forms and verification of accepting MD at [facility].  Call has been placed to receiving RN. Awaiting return call to verify information.  Will continue to monitor.

## 2016-03-04 NOTE — Progress Notes (Signed)
Progress Note    Ryan [name]  OFB:[id number] DOB: 18-Oct-1960  DOA: 03/03/2016 PCP: No PCP Per Patient    Brief Narrative:   Chief complaint: Follow-up acute renal failure  Ryan [name] is an 55 y.o. male with a PMH of kidney transplant in 2008, on chronic immunosuppressives until he ran out of his Prograf 10/2015 in the setting of losing his coverage/benefits for immunosuppressives. Upon initial evaluation in the ED, his creatinine was 11, BUN 130, potassium greater than 7.5, and bicarbonate 12. EKG showed peaked T waves.  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 Antirejection medications including Solu-Medrol, mycophenolate and Prograf resumed. Will need to be transferred to [facility] for renal biopsy. No recovery of renal function yet.  Active Problems:   Hyperkalemia Potassium is down to 5.0 this morning after being given Kayexalate, sodium bicarbonate, calcium, nebulized albuterol and insulin.    Metabolic acidosis, normal anion gap (NAG) Placed on sodium bicarbonate supplementation. Bicarbonate level up to 14.    Hyperlipidemia Continue Pravachol.   Family Communication/Anticipated D/C date and plan/Code Status   DVT prophylaxis: Heparin ordered. Code Status: Full Code.  Family Communication: No family at bedside. Disposition Plan: Transfer to [facility] when bed available.   Medical Consultants:    Nephrology   Procedures:    None  Anti-Infectives:    None  Subjective:   The patient reports that he feels well. Review of symptoms is positive for gas and loose stools after Kayexalate and negative for pain, nausea, vomiting, shortness of breath.  Objective:    Vitals:   03/04/16 0015 03/04/16 0030 03/04/16 0045 03/04/16 0208  BP: 129/82 138/89 142/84 (!) 148/99  Pulse: 103 99 101 99  Resp: 20 18 17 16   Temp:    98.2 F (36.8 C)  TempSrc:     Oral  SpO2: 92% 93% 92% 96%  Weight:    94.3 kg (207 lb 14.3 oz)  Height:    6\' 5"  (1.956 m)    Intake/Output Summary (Last 24 hours) at 03/04/16 0744 Last data filed at 03/04/16 0208  Gross per 24 hour  Intake              120 ml  Output                0 ml  Net              120 ml   Filed Weights   03/03/16 1544 03/04/16 0208  Weight: 95.2 kg (209 lb 12.8 oz) 94.3 kg (207 lb 14.3 oz)    Exam: General exam: Appears calm and comfortable.  Respiratory system: Clear to auscultation. Respiratory effort normal. Cardiovascular system: S1 & S2 heard, RRR. No JVD,  rubs, gallops or clicks. No murmurs. Gastrointestinal system: Abdomen is nondistended, soft and nontender. No organomegaly or masses felt. Normal bowel sounds heard. Central nervous system: Alert and oriented. No focal neurological deficits. Extremities: No clubbing,  or cyanosis. No edema. Skin: No rashes, lesions or ulcers. Psychiatry: Judgement and insight appear normal. Mood & affect appropriate.   Data Reviewed:   I have personally reviewed following labs and imaging studies:  Labs: Basic Metabolic Panel:  Recent Labs Lab 03/03/16 1605 03/03/16 1858 03/03/16 2017 03/04/16 0211  NA 133*  --  134* 135  K >7.5*  --  5.7* 5.0  CL 107  --  107 104  CO2 12*  --  9* 14*  GLUCOSE 118* 117* 143* 99  BUN 130*  --  130* 128*  CREATININE 11.16*  --  11.00* 11.13*  CALCIUM 9.6  --  9.6 9.1   GFR Estimated Creatinine Clearance: 9.6 mL/min (by C-G formula based on SCr of 11.13 mg/dL (H)).  CBC:  Recent Labs Lab 03/03/16 1605  WBC 9.3  HGB 13.0  HCT 38.8*  MCV 74.5*  PLT 533*   CBG:  Recent Labs Lab 03/03/16 1907  GLUCAP 109*    Microbiology Recent Results (from the past 240 hour(s))  MRSA PCR Screening     Status: None   Collection Time: 03/04/16  2:10 AM  Result Value Ref Range Status   MRSA by PCR NEGATIVE NEGATIVE Final    Comment:        The GeneXpert MRSA Assay (FDA approved for NASAL  specimens only), is one component of a comprehensive MRSA colonization surveillance program. It is not intended to diagnose MRSA infection nor to guide or monitor treatment for MRSA infections.     Radiology: US Renal Transplant W/doppler  Result Date: 03/03/2016 CLINICAL DATA:  55 year old male with hypertension and diabetes and right lower quadrant renal transplant concern for acute rejection of renal transplant. EXAM: ULTRASOUND OF RENAL TRANSPLANT WITH RENAL DOPPLER ULTRASOUND TECHNIQUE: Ultrasound examination of the renal transplant was performed with gray-scale, color and duplex doppler evaluation. COMPARISON:  None. FINDINGS: Transplant kidney location: Right lower quadrant Transplant Kidney: Length: 14 cm. There is mild hydronephrosis of the transplant kidney. The transplant renal parenchyma appears unremarkable. No peritransplant collection identified. Color flow in the main renal artery:  Detected Color flow in the main renal vein:  Detected Duplex Doppler Evaluation: Main Renal Artery Resistive Index: 0.65 Venous waveform in main renal vein:  Present Intrarenal resistive index in upper pole:  0.06 (normal 0.6-0.8; equivocal 0.8-0.9; abnormal >= 0.9) Intrarenal resistive index in lower pole: 0.06 (normal 0.6-0.8; equivocal 0.8-0.9; abnormal >= 0.9) Bladder: Normal for degree of bladder distention. Other findings: The native kidneys are small and echogenic. The right kidney measures 7 cm in length and the left kidney measures 9 cm in length. IMPRESSION: Mild hydronephrosis of the right lower quadrant renal transplant. No peritransplant fluid collection. The resistive indices are within normal limits. Electronically Signed   By: Anner Crete M.D.   On: 03/03/2016 21:36    Medications:   . aspirin  325 mg Oral Daily  . cloNIDine  0.2 mg Oral BID  . heparin  5,000 Units Subcutaneous Q8H  . Influenza vac split quadrivalent PF  0.5 mL Intramuscular Tomorrow-1000  . methylPREDNISolone  (SOLU-MEDROL) injection  125 mg Intravenous Daily  . mycophenolate  720 mg Oral BID  . pneumococcal 23 valent vaccine  0.5 mL Intramuscular Tomorrow-1000  . pravastatin  80 mg Oral Daily  . sodium chloride flush  3 mL Intravenous Q12H  . tacrolimus  7 mg Oral BID   Continuous Infusions:  Medical decision making is of high complexity and this patient is at high risk of deterioration, therefore this is a level 3 visit.    LOS: 1 day   Duyen Beckom  [facility] Pager [phone]. If unable to reach me by pager, please call my cell phone at [phone].  *Please refer to [email or web address], password [email or web address] to get updated schedule on who will round on this patient, as hospitalists switch teams weekly. If 7PM-7AM, please contact night-coverage at [email or web address], password [email or web address] for any overnight needs.  03/04/2016, 7:44 AM

## 2016-03-04 NOTE — Progress Notes (Signed)
Ryan [name] from [facility] informed this RN that Dr. Randie Heinz will be accepting this pt and is willing to accept pt with only notes and recent lab work.    Report given to [place], Therapist, sports. Will call [facility] to arrange transport.

## 2016-03-04 NOTE — Progress Notes (Signed)
Pt is scheduled for discharge to [facility]; not notified of available bed during this shift.

## 2016-03-04 NOTE — Progress Notes (Signed)
This Agricultural consultant received a call from Pt transfer nurse from [facility]. Reported on last set of vital signs. Took phone numbers for where to call report and planned room number ([place]).  Will report to shift RN and proceed with planned transfer.

## 2016-03-04 NOTE — Progress Notes (Signed)
Admit: 03/03/2016 LOS: 1  50M s/p KT 2008 admit with AKI (historical baseline low 1s) after running out of tac and dose reducing MMF  Subjective:  K and HCO3 improving this AM  Rec 125mg  methylprednisoline overnight PT states was taking some reduced doses of MMF but not Tac No AVF or AVG present   12/11 0701 - 12/12 0700 In: 120 [P.O.:120] Out: -   Filed Weights   03/03/16 1544 03/04/16 0208  Weight: 95.2 kg (209 lb 12.8 oz) 94.3 kg (207 lb 14.3 oz)    Scheduled Meds: . aspirin  325 mg Oral Daily  . cloNIDine  0.2 mg Oral BID  . heparin  5,000 Units Subcutaneous Q8H  . Influenza vac split quadrivalent PF  0.5 mL Intramuscular Tomorrow-1000  . [START ON 03/05/2016] methylPREDNISolone (SOLU-MEDROL) injection  500 mg Intravenous Daily  . mycophenolate  720 mg Oral BID  . pneumococcal 23 valent vaccine  0.5 mL Intramuscular Tomorrow-1000  . pravastatin  80 mg Oral Daily  . sodium chloride flush  3 mL Intravenous Q12H  . tacrolimus  7 mg Oral BID   Continuous Infusions: PRN Meds:.ondansetron  Current Labs: reviewed    Physical Exam:  Blood pressure (!) 162/98, pulse 91, temperature 97.7 F (36.5 C), temperature source Oral, resp. rate 16, height 6\' 5"  (1.956 m), weight 94.3 kg (207 lb 14.3 oz), SpO2 96 %. NAD RRR, no rub CTAB, nl wob S/nt/nd. No RLQ tenderness No LEE NO rashes/lesions  A 1. S/p KT 2008, now with renal failure unclear chronicity after significant reduction in IS  1. Has been out of tac 2. Was taking reduced doses of MMF 3. Tplt [place] overnight with 14cm, mild HN, parenchyma unremarkable and blood flow to artery and vein detected with normal RI 2. Hyperkalemia, improved with medical management 3. Metabolic Acidosis; inc AG; 2/2 #1, driving #2  P 1. Pt needs transfer to transplant center, call placed to discuss with [facility] 2. Increase solumedrol to 500mg  daily x3d 3. Cont Tac and MMF for now 4. Begin 1300mg  BID NaHCO3 PO 5. BID Labs    Pearson Grippe  MD 03/04/2016, 8:34 AM   Recent Labs Lab 03/03/16 1605 03/03/16 1858 03/03/16 2017 03/04/16 0211  NA 133*  --  134* 135  K >7.5*  --  5.7* 5.0  CL 107  --  107 104  CO2 12*  --  9* 14*  GLUCOSE 118* 117* 143* 99  BUN 130*  --  130* 128*  CREATININE 11.16*  --  11.00* 11.13*  CALCIUM 9.6  --  9.6 9.1    Recent Labs Lab 03/03/16 1605  WBC 9.3  HGB 13.0  HCT 38.8*  MCV 74.5*  PLT 533*

## 2016-03-04 NOTE — Progress Notes (Signed)
Pt slated for transfer to [facility] with transfer order placed on 03/04/16. [facility] had been contacted by day time MDs and agreement was reached to transfer pt when bed was available. Bed became available tonight. [facility] is aware that no discharge summary has been done as of now and that notes/labs, etc can be sent with pt. [place] still agrees to take pt. EMTALA completed and RN to arrange for transport.  KJKG, NP [facility]

## 2016-03-05 ENCOUNTER — Encounter (HOSPITAL_COMMUNITY): Payer: Self-pay | Admitting: *Deleted

## 2016-03-05 DIAGNOSIS — R001 Bradycardia, unspecified: Secondary | ICD-10-CM | POA: Diagnosis not present

## 2016-03-05 DIAGNOSIS — T451X5A Adverse effect of antineoplastic and immunosuppressive drugs, initial encounter: Secondary | ICD-10-CM | POA: Diagnosis present

## 2016-03-05 DIAGNOSIS — Z4822 Encounter for aftercare following kidney transplant: Secondary | ICD-10-CM | POA: Diagnosis not present

## 2016-03-05 DIAGNOSIS — Z940 Kidney transplant status: Secondary | ICD-10-CM | POA: Diagnosis not present

## 2016-03-05 DIAGNOSIS — E785 Hyperlipidemia, unspecified: Secondary | ICD-10-CM | POA: Diagnosis present

## 2016-03-05 DIAGNOSIS — R9431 Abnormal electrocardiogram [ECG] [EKG]: Secondary | ICD-10-CM

## 2016-03-05 DIAGNOSIS — Z9114 Patient's other noncompliance with medication regimen: Secondary | ICD-10-CM | POA: Diagnosis not present

## 2016-03-05 DIAGNOSIS — N1330 Unspecified hydronephrosis: Secondary | ICD-10-CM | POA: Diagnosis present

## 2016-03-05 DIAGNOSIS — T82838A Hemorrhage of vascular prosthetic devices, implants and grafts, initial encounter: Secondary | ICD-10-CM | POA: Diagnosis not present

## 2016-03-05 DIAGNOSIS — Z599 Problem related to housing and economic circumstances, unspecified: Secondary | ICD-10-CM | POA: Diagnosis not present

## 2016-03-05 DIAGNOSIS — Z79899 Other long term (current) drug therapy: Secondary | ICD-10-CM | POA: Diagnosis not present

## 2016-03-05 DIAGNOSIS — N184 Chronic kidney disease, stage 4 (severe): Secondary | ICD-10-CM | POA: Diagnosis not present

## 2016-03-05 DIAGNOSIS — N1339 Other hydronephrosis: Secondary | ICD-10-CM | POA: Diagnosis not present

## 2016-03-05 DIAGNOSIS — Z992 Dependence on renal dialysis: Secondary | ICD-10-CM | POA: Diagnosis not present

## 2016-03-05 DIAGNOSIS — E8770 Fluid overload, unspecified: Secondary | ICD-10-CM | POA: Diagnosis not present

## 2016-03-05 DIAGNOSIS — D696 Thrombocytopenia, unspecified: Secondary | ICD-10-CM | POA: Diagnosis not present

## 2016-03-05 DIAGNOSIS — R944 Abnormal results of kidney function studies: Secondary | ICD-10-CM | POA: Diagnosis not present

## 2016-03-05 DIAGNOSIS — J702 Acute drug-induced interstitial lung disorders: Secondary | ICD-10-CM | POA: Diagnosis not present

## 2016-03-05 DIAGNOSIS — N289 Disorder of kidney and ureter, unspecified: Secondary | ICD-10-CM | POA: Diagnosis not present

## 2016-03-05 DIAGNOSIS — D89813 Graft-versus-host disease, unspecified: Secondary | ICD-10-CM | POA: Diagnosis not present

## 2016-03-05 DIAGNOSIS — D899 Disorder involving the immune mechanism, unspecified: Secondary | ICD-10-CM | POA: Diagnosis not present

## 2016-03-05 DIAGNOSIS — I120 Hypertensive chronic kidney disease with stage 5 chronic kidney disease or end stage renal disease: Secondary | ICD-10-CM | POA: Diagnosis present

## 2016-03-05 DIAGNOSIS — N2889 Other specified disorders of kidney and ureter: Secondary | ICD-10-CM | POA: Diagnosis not present

## 2016-03-05 DIAGNOSIS — N186 End stage renal disease: Secondary | ICD-10-CM | POA: Diagnosis present

## 2016-03-05 DIAGNOSIS — T86890 Other transplanted tissue rejection: Secondary | ICD-10-CM | POA: Diagnosis not present

## 2016-03-05 DIAGNOSIS — N400 Enlarged prostate without lower urinary tract symptoms: Secondary | ICD-10-CM | POA: Diagnosis not present

## 2016-03-05 DIAGNOSIS — T451X6A Underdosing of antineoplastic and immunosuppressive drugs, initial encounter: Secondary | ICD-10-CM | POA: Diagnosis present

## 2016-03-05 DIAGNOSIS — T8610 Unspecified complication of kidney transplant: Secondary | ICD-10-CM | POA: Diagnosis not present

## 2016-03-05 DIAGNOSIS — D6959 Other secondary thrombocytopenia: Secondary | ICD-10-CM | POA: Diagnosis not present

## 2016-03-05 DIAGNOSIS — Z91120 Patient's intentional underdosing of medication regimen due to financial hardship: Secondary | ICD-10-CM | POA: Diagnosis not present

## 2016-03-05 DIAGNOSIS — T8611 Kidney transplant rejection: Secondary | ICD-10-CM | POA: Diagnosis present

## 2016-03-05 DIAGNOSIS — T50995A Adverse effect of other drugs, medicaments and biological substances, initial encounter: Secondary | ICD-10-CM | POA: Diagnosis not present

## 2016-03-05 DIAGNOSIS — N179 Acute kidney failure, unspecified: Secondary | ICD-10-CM | POA: Diagnosis present

## 2016-03-05 DIAGNOSIS — E875 Hyperkalemia: Secondary | ICD-10-CM | POA: Diagnosis present

## 2016-03-05 LAB — TACROLIMUS LEVEL: Tacrolimus (FK506) - LabCorp: NOT DETECTED ng/mL (ref 2.0–20.0)

## 2016-03-05 NOTE — Discharge Summary (Signed)
Physician Discharge Summary  Ryan [name] PJA:[id number] DOB: 20-May-1960 DOA: 03/03/2016  PCP: No PCP Per Patient  Admit date: 03/03/2016 Discharge date: 03/04/2016  Admitted From: Home Discharge disposition: Transferred to [facility]   Recommendations for Outpatient Follow-Up:   1. F/U recommendations made by [facility]   Discharge Diagnosis:   Principal Problem:    Acute renal transplant rejection Active Problems:    Hyperkalemia    Acute kidney failure (HCC)    Acute uremia    Metabolic acidosis, normal anion gap (NAG)  Discharge Condition: Stable  Diet recommendation: Renal.  History of Present Illness:   Ryan [name] is an 55 y.o. male with a PMH of kidney transplant in 2008, on chronic immunosuppressives until he ran out of his Prograf 10/2015 in the setting of losing his coverage/benefits for immunosuppressives. Upon initial evaluation in the ED, his creatinine was 11, BUN 130, potassium greater than 7.5, and bicarbonate 12. EKG showed peaked T waves.  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 Antirejection medications including Solu-Medrol, mycophenolate and Prograf resumed. Will need to be transferred to [facility] for renal biopsy. No recovery of renal function yet.  Active Problems:   Hyperkalemia Potassium is down to 5.0 this morning after being given Kayexalate, sodium bicarbonate, calcium, nebulized albuterol and insulin.    Metabolic acidosis, normal anion gap (NAG) Placed on sodium bicarbonate supplementation. Bicarbonate level up to 14.    Hyperlipidemia Continue Pravachol.    Medical Consultants:    Nephrology   Discharge Exam:   Vitals:   03/05/16 0000 03/05/16 0200  BP: (!) 140/101 137/90  Pulse: 82 76  Resp: 14 14  Temp: 97.7 F (36.5 C)    Vitals:   03/04/16 2344 03/04/16 2350 03/05/16 0000 03/05/16 0200    BP: (!) 151/102 (!) 160/100 (!) 140/101 137/90  Pulse: 85  82 76  Resp: 16  14 14   Temp:   97.7 F (36.5 C)   TempSrc:   Oral   SpO2: 95%  96% 96%  Weight:      Height:        General exam: Appears calm and comfortable.  Respiratory system: Clear to auscultation. Respiratory effort normal. Cardiovascular system: S1 & S2 heard, RRR. No JVD,  rubs, gallops or clicks. No murmurs. Gastrointestinal system: Abdomen is nondistended, soft and nontender. No organomegaly or masses felt. Normal bowel sounds heard. Central nervous system: Alert and oriented. No focal neurological deficits. Extremities: No clubbing,  or cyanosis. No edema. Skin: No rashes, lesions or ulcers. Psychiatry: Judgement and insight appear normal. Mood & affect appropriate.    The results of significant diagnostics from this hospitalization (including imaging, microbiology, ancillary and laboratory) are listed below for reference.     Procedures and Diagnostic Studies:   US Renal Transplant W/doppler  Result Date: 03/03/2016 CLINICAL DATA:  55 year old male with hypertension and diabetes and right lower quadrant renal transplant concern for acute rejection of renal transplant. EXAM: ULTRASOUND OF RENAL TRANSPLANT WITH RENAL DOPPLER ULTRASOUND TECHNIQUE: Ultrasound examination of the renal transplant was performed with gray-scale, color and duplex doppler evaluation. COMPARISON:  None. FINDINGS: Transplant kidney location: Right lower quadrant Transplant Kidney: Length: 14 cm. There is mild hydronephrosis of the transplant kidney. The transplant renal parenchyma appears unremarkable. No peritransplant collection identified. Color flow in the main renal artery:  Detected Color flow in the main renal vein:  Detected Duplex Doppler Evaluation:  Main Renal Artery Resistive Index: 0.65 Venous waveform in main renal vein:  Present Intrarenal resistive index in upper pole:  0.06 (normal 0.6-0.8; equivocal 0.8-0.9; abnormal >= 0.9)  Intrarenal resistive index in lower pole: 0.06 (normal 0.6-0.8; equivocal 0.8-0.9; abnormal >= 0.9) Bladder: Normal for degree of bladder distention. Other findings: The native kidneys are small and echogenic. The right kidney measures 7 cm in length and the left kidney measures 9 cm in length. IMPRESSION: Mild hydronephrosis of the right lower quadrant renal transplant. No peritransplant fluid collection. The resistive indices are within normal limits. Electronically Signed   By: Anner Crete M.D.   On: 03/03/2016 21:36     Labs:   Basic Metabolic Panel:  Recent Labs Lab 03/03/16 1605 03/03/16 1858 03/03/16 2017 03/04/16 0211 03/04/16 1450  NA 133*  --  134* 135 134*  K >7.5*  --  5.7* 5.0 5.3*  CL 107  --  107 104 103  CO2 12*  --  9* 14* 14*  GLUCOSE 118* 117* 143* 99 171*  BUN 130*  --  130* 128* 134*  CREATININE 11.16*  --  11.00* 11.13* 11.15*  CALCIUM 9.6  --  9.6 9.1 8.6*   GFR Estimated Creatinine Clearance: 9.5 mL/min (by C-G formula based on SCr of 11.15 mg/dL (H)).  CBC:  Recent Labs Lab 03/03/16 1605  WBC 9.3  HGB 13.0  HCT 38.8*  MCV 74.5*  PLT 533*   CBG:  Recent Labs Lab 03/03/16 1907  GLUCAP 109*   Microbiology Recent Results (from the past 240 hour(s))  MRSA PCR Screening     Status: None   Collection Time: 03/04/16  2:10 AM  Result Value Ref Range Status   MRSA by PCR NEGATIVE NEGATIVE Final    Comment:        The GeneXpert MRSA Assay (FDA approved for NASAL specimens only), is one component of a comprehensive MRSA colonization surveillance program. It is not intended to diagnose MRSA infection nor to guide or monitor treatment for MRSA infections.      Discharge Instructions:   Discharge Instructions    Diet - low sodium heart healthy    Complete by:  As directed    Increase activity slowly    Complete by:  As directed        Medication List    STOP taking these medications   enalapril 10 MG tablet Commonly known  as:  VASOTEC   metFORMIN 500 MG tablet Commonly known as:  GLUCOPHAGE   NIFEdipine 60 MG 24 hr tablet Commonly known as:  PROCARDIA-XL/ADALAT CC     TAKE these medications   aspirin 325 MG tablet Take 325 mg by mouth every morning.   cloNIDine 0.2 MG tablet Commonly known as:  CATAPRES Take 0.2 mg by mouth 2 (two) times daily.   Fish Oil 1000 MG Caps Take 1,000 mg by mouth daily.   heparin 5000 UNIT/ML injection Inject 1 mL (5,000 Units total) into the skin every 8 (eight) hours.   methylPREDNISolone sodium succinate 500 mg in sodium chloride 0.9 % 50 mL Inject 500 mg into the vein daily.   mycophenolate 180 MG EC tablet Commonly known as:  MYFORTIC Take 720 mg by mouth 2 (two) times daily.   pravastatin 80 MG tablet Commonly known as:  PRAVACHOL Take 80 mg by mouth every morning.   PROGRAF 1 MG capsule Generic drug:  tacrolimus Take 7 mg by mouth 2 (two) times daily.   sodium bicarbonate 650  MG tablet Take 2 tablets (1,300 mg total) by mouth 2 (two) times daily.   Vitamin D3 10000 units capsule Take 10,000 Units by mouth daily.     ASK your doctor about these medications   methylPREDNISolone sodium succinate 380 mg in sodium chloride 0.9 % 50 mL Inject 380 mg into the vein once. Ask about: Should I take this medication?         Time coordinating discharge: > 35 minutes.  Signed:  Caela Huot  Pager [phone] [facility] 03/05/2016, 4:14 PM

## 2016-03-05 NOTE — Progress Notes (Addendum)
Report given to [facility] and here to pick up patient. Pt educated on why he's being transported to [facility]. Belongings returned to patient. Pt informed RN that he has already informed his family about transferring to [facility].  RN at [facility] informed that pt is on his way.

## 2016-03-05 NOTE — Progress Notes (Signed)
Provider on call made aware of elevated BP. No orders given at this time. BP now 137/90. Will continue to monitor.

## 2016-03-31 DIAGNOSIS — T8611 Kidney transplant rejection: Secondary | ICD-10-CM | POA: Diagnosis not present

## 2016-03-31 DIAGNOSIS — Z9114 Patient's other noncompliance with medication regimen: Secondary | ICD-10-CM | POA: Diagnosis not present

## 2016-03-31 DIAGNOSIS — D649 Anemia, unspecified: Secondary | ICD-10-CM | POA: Diagnosis not present

## 2016-03-31 DIAGNOSIS — N189 Chronic kidney disease, unspecified: Secondary | ICD-10-CM | POA: Insufficient documentation

## 2016-03-31 DIAGNOSIS — D899 Disorder involving the immune mechanism, unspecified: Secondary | ICD-10-CM | POA: Diagnosis not present

## 2016-03-31 DIAGNOSIS — Z79899 Other long term (current) drug therapy: Secondary | ICD-10-CM | POA: Diagnosis not present

## 2016-04-02 DIAGNOSIS — Z940 Kidney transplant status: Secondary | ICD-10-CM | POA: Diagnosis not present

## 2016-04-02 DIAGNOSIS — N289 Disorder of kidney and ureter, unspecified: Secondary | ICD-10-CM | POA: Diagnosis not present

## 2016-04-02 DIAGNOSIS — D899 Disorder involving the immune mechanism, unspecified: Secondary | ICD-10-CM | POA: Diagnosis not present

## 2016-04-02 DIAGNOSIS — N185 Chronic kidney disease, stage 5: Secondary | ICD-10-CM | POA: Diagnosis not present

## 2016-04-02 DIAGNOSIS — T8611 Kidney transplant rejection: Secondary | ICD-10-CM | POA: Diagnosis not present

## 2016-04-04 DIAGNOSIS — T8611 Kidney transplant rejection: Secondary | ICD-10-CM | POA: Diagnosis not present

## 2016-04-04 DIAGNOSIS — N179 Acute kidney failure, unspecified: Secondary | ICD-10-CM | POA: Diagnosis not present

## 2016-04-04 DIAGNOSIS — N186 End stage renal disease: Secondary | ICD-10-CM | POA: Diagnosis not present

## 2016-04-04 DIAGNOSIS — D509 Iron deficiency anemia, unspecified: Secondary | ICD-10-CM | POA: Diagnosis not present

## 2016-04-09 DIAGNOSIS — N2581 Secondary hyperparathyroidism of renal origin: Secondary | ICD-10-CM | POA: Diagnosis not present

## 2016-04-09 DIAGNOSIS — T8612 Kidney transplant failure: Secondary | ICD-10-CM | POA: Diagnosis not present

## 2016-04-09 DIAGNOSIS — D649 Anemia, unspecified: Secondary | ICD-10-CM | POA: Diagnosis not present

## 2016-04-09 DIAGNOSIS — Z992 Dependence on renal dialysis: Secondary | ICD-10-CM | POA: Diagnosis not present

## 2016-04-09 DIAGNOSIS — R600 Localized edema: Secondary | ICD-10-CM | POA: Diagnosis not present

## 2016-04-09 DIAGNOSIS — Z5181 Encounter for therapeutic drug level monitoring: Secondary | ICD-10-CM | POA: Diagnosis not present

## 2016-04-09 DIAGNOSIS — I120 Hypertensive chronic kidney disease with stage 5 chronic kidney disease or end stage renal disease: Secondary | ICD-10-CM | POA: Diagnosis not present

## 2016-04-09 DIAGNOSIS — I10 Essential (primary) hypertension: Secondary | ICD-10-CM | POA: Diagnosis not present

## 2016-04-09 DIAGNOSIS — D631 Anemia in chronic kidney disease: Secondary | ICD-10-CM | POA: Diagnosis not present

## 2016-04-09 DIAGNOSIS — T8611 Kidney transplant rejection: Secondary | ICD-10-CM | POA: Diagnosis not present

## 2016-04-09 DIAGNOSIS — R609 Edema, unspecified: Secondary | ICD-10-CM | POA: Diagnosis not present

## 2016-04-09 DIAGNOSIS — D638 Anemia in other chronic diseases classified elsewhere: Secondary | ICD-10-CM | POA: Insufficient documentation

## 2016-04-09 DIAGNOSIS — D899 Disorder involving the immune mechanism, unspecified: Secondary | ICD-10-CM | POA: Diagnosis not present

## 2016-04-09 DIAGNOSIS — Z940 Kidney transplant status: Secondary | ICD-10-CM | POA: Diagnosis not present

## 2016-04-09 DIAGNOSIS — Z4822 Encounter for aftercare following kidney transplant: Secondary | ICD-10-CM | POA: Diagnosis not present

## 2016-04-09 DIAGNOSIS — N186 End stage renal disease: Secondary | ICD-10-CM | POA: Diagnosis not present

## 2016-04-09 DIAGNOSIS — Z79899 Other long term (current) drug therapy: Secondary | ICD-10-CM | POA: Diagnosis not present

## 2016-04-09 DIAGNOSIS — N179 Acute kidney failure, unspecified: Secondary | ICD-10-CM | POA: Diagnosis not present

## 2016-04-10 DIAGNOSIS — Z79899 Other long term (current) drug therapy: Secondary | ICD-10-CM | POA: Diagnosis not present

## 2016-04-10 DIAGNOSIS — D631 Anemia in chronic kidney disease: Secondary | ICD-10-CM | POA: Diagnosis not present

## 2016-04-10 DIAGNOSIS — I120 Hypertensive chronic kidney disease with stage 5 chronic kidney disease or end stage renal disease: Secondary | ICD-10-CM | POA: Diagnosis not present

## 2016-04-10 DIAGNOSIS — T8612 Kidney transplant failure: Secondary | ICD-10-CM | POA: Diagnosis not present

## 2016-04-10 DIAGNOSIS — Z940 Kidney transplant status: Secondary | ICD-10-CM | POA: Diagnosis not present

## 2016-04-10 DIAGNOSIS — Z4822 Encounter for aftercare following kidney transplant: Secondary | ICD-10-CM | POA: Diagnosis not present

## 2016-04-10 DIAGNOSIS — D899 Disorder involving the immune mechanism, unspecified: Secondary | ICD-10-CM | POA: Diagnosis not present

## 2016-04-10 DIAGNOSIS — T8611 Kidney transplant rejection: Secondary | ICD-10-CM | POA: Diagnosis not present

## 2016-04-10 DIAGNOSIS — I10 Essential (primary) hypertension: Secondary | ICD-10-CM | POA: Diagnosis not present

## 2016-04-10 DIAGNOSIS — N186 End stage renal disease: Secondary | ICD-10-CM | POA: Diagnosis not present

## 2016-04-10 DIAGNOSIS — N2581 Secondary hyperparathyroidism of renal origin: Secondary | ICD-10-CM | POA: Diagnosis not present

## 2016-04-10 DIAGNOSIS — N179 Acute kidney failure, unspecified: Secondary | ICD-10-CM | POA: Diagnosis not present

## 2016-04-10 DIAGNOSIS — R609 Edema, unspecified: Secondary | ICD-10-CM | POA: Diagnosis not present

## 2016-04-10 DIAGNOSIS — Z5181 Encounter for therapeutic drug level monitoring: Secondary | ICD-10-CM | POA: Diagnosis not present

## 2016-04-10 DIAGNOSIS — D649 Anemia, unspecified: Secondary | ICD-10-CM | POA: Diagnosis not present

## 2016-04-10 DIAGNOSIS — Z992 Dependence on renal dialysis: Secondary | ICD-10-CM | POA: Diagnosis not present

## 2016-04-12 DIAGNOSIS — D509 Iron deficiency anemia, unspecified: Secondary | ICD-10-CM | POA: Diagnosis not present

## 2016-04-12 DIAGNOSIS — N186 End stage renal disease: Secondary | ICD-10-CM | POA: Diagnosis not present

## 2016-04-12 DIAGNOSIS — N179 Acute kidney failure, unspecified: Secondary | ICD-10-CM | POA: Diagnosis not present

## 2016-04-16 DIAGNOSIS — N186 End stage renal disease: Secondary | ICD-10-CM | POA: Diagnosis not present

## 2016-04-16 DIAGNOSIS — N179 Acute kidney failure, unspecified: Secondary | ICD-10-CM | POA: Diagnosis not present

## 2016-04-23 DIAGNOSIS — N186 End stage renal disease: Secondary | ICD-10-CM | POA: Diagnosis not present

## 2016-04-23 DIAGNOSIS — D509 Iron deficiency anemia, unspecified: Secondary | ICD-10-CM | POA: Diagnosis not present

## 2016-04-25 DIAGNOSIS — D509 Iron deficiency anemia, unspecified: Secondary | ICD-10-CM | POA: Diagnosis not present

## 2016-04-25 DIAGNOSIS — N186 End stage renal disease: Secondary | ICD-10-CM | POA: Diagnosis not present

## 2016-04-25 DIAGNOSIS — D631 Anemia in chronic kidney disease: Secondary | ICD-10-CM | POA: Diagnosis not present

## 2016-04-25 DIAGNOSIS — Z418 Encounter for other procedures for purposes other than remedying health state: Secondary | ICD-10-CM | POA: Diagnosis not present

## 2016-04-25 DIAGNOSIS — I10 Essential (primary) hypertension: Secondary | ICD-10-CM | POA: Diagnosis not present

## 2016-04-25 DIAGNOSIS — I120 Hypertensive chronic kidney disease with stage 5 chronic kidney disease or end stage renal disease: Secondary | ICD-10-CM | POA: Diagnosis not present

## 2016-04-28 DIAGNOSIS — D631 Anemia in chronic kidney disease: Secondary | ICD-10-CM | POA: Diagnosis not present

## 2016-04-28 DIAGNOSIS — Z418 Encounter for other procedures for purposes other than remedying health state: Secondary | ICD-10-CM | POA: Diagnosis not present

## 2016-04-28 DIAGNOSIS — D509 Iron deficiency anemia, unspecified: Secondary | ICD-10-CM | POA: Diagnosis not present

## 2016-04-28 DIAGNOSIS — N186 End stage renal disease: Secondary | ICD-10-CM | POA: Diagnosis not present

## 2016-04-28 DIAGNOSIS — I120 Hypertensive chronic kidney disease with stage 5 chronic kidney disease or end stage renal disease: Secondary | ICD-10-CM | POA: Diagnosis not present

## 2016-04-28 DIAGNOSIS — I10 Essential (primary) hypertension: Secondary | ICD-10-CM | POA: Diagnosis not present

## 2016-04-30 DIAGNOSIS — D509 Iron deficiency anemia, unspecified: Secondary | ICD-10-CM | POA: Diagnosis not present

## 2016-04-30 DIAGNOSIS — N186 End stage renal disease: Secondary | ICD-10-CM | POA: Diagnosis not present

## 2016-04-30 DIAGNOSIS — I10 Essential (primary) hypertension: Secondary | ICD-10-CM | POA: Diagnosis not present

## 2016-04-30 DIAGNOSIS — I120 Hypertensive chronic kidney disease with stage 5 chronic kidney disease or end stage renal disease: Secondary | ICD-10-CM | POA: Diagnosis not present

## 2016-04-30 DIAGNOSIS — D631 Anemia in chronic kidney disease: Secondary | ICD-10-CM | POA: Diagnosis not present

## 2016-04-30 DIAGNOSIS — Z418 Encounter for other procedures for purposes other than remedying health state: Secondary | ICD-10-CM | POA: Diagnosis not present

## 2016-05-02 DIAGNOSIS — Z418 Encounter for other procedures for purposes other than remedying health state: Secondary | ICD-10-CM | POA: Diagnosis not present

## 2016-05-02 DIAGNOSIS — D509 Iron deficiency anemia, unspecified: Secondary | ICD-10-CM | POA: Diagnosis not present

## 2016-05-02 DIAGNOSIS — I10 Essential (primary) hypertension: Secondary | ICD-10-CM | POA: Diagnosis not present

## 2016-05-02 DIAGNOSIS — D631 Anemia in chronic kidney disease: Secondary | ICD-10-CM | POA: Diagnosis not present

## 2016-05-02 DIAGNOSIS — I120 Hypertensive chronic kidney disease with stage 5 chronic kidney disease or end stage renal disease: Secondary | ICD-10-CM | POA: Diagnosis not present

## 2016-05-02 DIAGNOSIS — N186 End stage renal disease: Secondary | ICD-10-CM | POA: Diagnosis not present

## 2016-05-05 DIAGNOSIS — Z418 Encounter for other procedures for purposes other than remedying health state: Secondary | ICD-10-CM | POA: Diagnosis not present

## 2016-05-05 DIAGNOSIS — D509 Iron deficiency anemia, unspecified: Secondary | ICD-10-CM | POA: Diagnosis not present

## 2016-05-05 DIAGNOSIS — D631 Anemia in chronic kidney disease: Secondary | ICD-10-CM | POA: Diagnosis not present

## 2016-05-05 DIAGNOSIS — N186 End stage renal disease: Secondary | ICD-10-CM | POA: Diagnosis not present

## 2016-05-05 DIAGNOSIS — I10 Essential (primary) hypertension: Secondary | ICD-10-CM | POA: Diagnosis not present

## 2016-05-05 DIAGNOSIS — I120 Hypertensive chronic kidney disease with stage 5 chronic kidney disease or end stage renal disease: Secondary | ICD-10-CM | POA: Diagnosis not present

## 2016-05-07 DIAGNOSIS — N186 End stage renal disease: Secondary | ICD-10-CM | POA: Diagnosis not present

## 2016-05-07 DIAGNOSIS — Z418 Encounter for other procedures for purposes other than remedying health state: Secondary | ICD-10-CM | POA: Diagnosis not present

## 2016-05-07 DIAGNOSIS — I120 Hypertensive chronic kidney disease with stage 5 chronic kidney disease or end stage renal disease: Secondary | ICD-10-CM | POA: Diagnosis not present

## 2016-05-07 DIAGNOSIS — I10 Essential (primary) hypertension: Secondary | ICD-10-CM | POA: Diagnosis not present

## 2016-05-07 DIAGNOSIS — D631 Anemia in chronic kidney disease: Secondary | ICD-10-CM | POA: Diagnosis not present

## 2016-05-07 DIAGNOSIS — D509 Iron deficiency anemia, unspecified: Secondary | ICD-10-CM | POA: Diagnosis not present

## 2016-05-09 DIAGNOSIS — Z418 Encounter for other procedures for purposes other than remedying health state: Secondary | ICD-10-CM | POA: Diagnosis not present

## 2016-05-09 DIAGNOSIS — D509 Iron deficiency anemia, unspecified: Secondary | ICD-10-CM | POA: Diagnosis not present

## 2016-05-09 DIAGNOSIS — I10 Essential (primary) hypertension: Secondary | ICD-10-CM | POA: Diagnosis not present

## 2016-05-09 DIAGNOSIS — I120 Hypertensive chronic kidney disease with stage 5 chronic kidney disease or end stage renal disease: Secondary | ICD-10-CM | POA: Diagnosis not present

## 2016-05-09 DIAGNOSIS — D631 Anemia in chronic kidney disease: Secondary | ICD-10-CM | POA: Diagnosis not present

## 2016-05-09 DIAGNOSIS — N186 End stage renal disease: Secondary | ICD-10-CM | POA: Diagnosis not present

## 2016-05-12 DIAGNOSIS — D509 Iron deficiency anemia, unspecified: Secondary | ICD-10-CM | POA: Diagnosis not present

## 2016-05-12 DIAGNOSIS — N186 End stage renal disease: Secondary | ICD-10-CM | POA: Diagnosis not present

## 2016-05-12 DIAGNOSIS — D631 Anemia in chronic kidney disease: Secondary | ICD-10-CM | POA: Diagnosis not present

## 2016-05-12 DIAGNOSIS — I10 Essential (primary) hypertension: Secondary | ICD-10-CM | POA: Diagnosis not present

## 2016-05-12 DIAGNOSIS — Z418 Encounter for other procedures for purposes other than remedying health state: Secondary | ICD-10-CM | POA: Diagnosis not present

## 2016-05-12 DIAGNOSIS — I120 Hypertensive chronic kidney disease with stage 5 chronic kidney disease or end stage renal disease: Secondary | ICD-10-CM | POA: Diagnosis not present

## 2016-05-14 DIAGNOSIS — I120 Hypertensive chronic kidney disease with stage 5 chronic kidney disease or end stage renal disease: Secondary | ICD-10-CM | POA: Diagnosis not present

## 2016-05-14 DIAGNOSIS — I10 Essential (primary) hypertension: Secondary | ICD-10-CM | POA: Diagnosis not present

## 2016-05-14 DIAGNOSIS — N186 End stage renal disease: Secondary | ICD-10-CM | POA: Diagnosis not present

## 2016-05-14 DIAGNOSIS — D509 Iron deficiency anemia, unspecified: Secondary | ICD-10-CM | POA: Diagnosis not present

## 2016-05-14 DIAGNOSIS — D631 Anemia in chronic kidney disease: Secondary | ICD-10-CM | POA: Diagnosis not present

## 2016-05-14 DIAGNOSIS — Z418 Encounter for other procedures for purposes other than remedying health state: Secondary | ICD-10-CM | POA: Diagnosis not present

## 2016-05-16 DIAGNOSIS — D631 Anemia in chronic kidney disease: Secondary | ICD-10-CM | POA: Diagnosis not present

## 2016-05-16 DIAGNOSIS — I120 Hypertensive chronic kidney disease with stage 5 chronic kidney disease or end stage renal disease: Secondary | ICD-10-CM | POA: Diagnosis not present

## 2016-05-16 DIAGNOSIS — Z418 Encounter for other procedures for purposes other than remedying health state: Secondary | ICD-10-CM | POA: Diagnosis not present

## 2016-05-16 DIAGNOSIS — N186 End stage renal disease: Secondary | ICD-10-CM | POA: Diagnosis not present

## 2016-05-16 DIAGNOSIS — I10 Essential (primary) hypertension: Secondary | ICD-10-CM | POA: Diagnosis not present

## 2016-05-16 DIAGNOSIS — D509 Iron deficiency anemia, unspecified: Secondary | ICD-10-CM | POA: Diagnosis not present

## 2016-05-19 DIAGNOSIS — I120 Hypertensive chronic kidney disease with stage 5 chronic kidney disease or end stage renal disease: Secondary | ICD-10-CM | POA: Diagnosis not present

## 2016-05-19 DIAGNOSIS — N186 End stage renal disease: Secondary | ICD-10-CM | POA: Diagnosis not present

## 2016-05-19 DIAGNOSIS — I10 Essential (primary) hypertension: Secondary | ICD-10-CM | POA: Diagnosis not present

## 2016-05-19 DIAGNOSIS — D631 Anemia in chronic kidney disease: Secondary | ICD-10-CM | POA: Diagnosis not present

## 2016-05-19 DIAGNOSIS — Z418 Encounter for other procedures for purposes other than remedying health state: Secondary | ICD-10-CM | POA: Diagnosis not present

## 2016-05-19 DIAGNOSIS — D509 Iron deficiency anemia, unspecified: Secondary | ICD-10-CM | POA: Diagnosis not present

## 2016-05-21 DIAGNOSIS — D509 Iron deficiency anemia, unspecified: Secondary | ICD-10-CM | POA: Diagnosis not present

## 2016-05-21 DIAGNOSIS — I10 Essential (primary) hypertension: Secondary | ICD-10-CM | POA: Diagnosis not present

## 2016-05-21 DIAGNOSIS — Z992 Dependence on renal dialysis: Secondary | ICD-10-CM | POA: Diagnosis not present

## 2016-05-21 DIAGNOSIS — I120 Hypertensive chronic kidney disease with stage 5 chronic kidney disease or end stage renal disease: Secondary | ICD-10-CM | POA: Diagnosis not present

## 2016-05-21 DIAGNOSIS — D631 Anemia in chronic kidney disease: Secondary | ICD-10-CM | POA: Diagnosis not present

## 2016-05-21 DIAGNOSIS — Z418 Encounter for other procedures for purposes other than remedying health state: Secondary | ICD-10-CM | POA: Diagnosis not present

## 2016-05-21 DIAGNOSIS — N186 End stage renal disease: Secondary | ICD-10-CM | POA: Diagnosis not present

## 2016-05-23 DIAGNOSIS — Z7982 Long term (current) use of aspirin: Secondary | ICD-10-CM | POA: Diagnosis not present

## 2016-05-23 DIAGNOSIS — N186 End stage renal disease: Secondary | ICD-10-CM | POA: Diagnosis not present

## 2016-05-23 DIAGNOSIS — Z23 Encounter for immunization: Secondary | ICD-10-CM | POA: Diagnosis not present

## 2016-05-23 DIAGNOSIS — I120 Hypertensive chronic kidney disease with stage 5 chronic kidney disease or end stage renal disease: Secondary | ICD-10-CM | POA: Diagnosis not present

## 2016-05-23 DIAGNOSIS — D509 Iron deficiency anemia, unspecified: Secondary | ICD-10-CM | POA: Diagnosis not present

## 2016-05-23 DIAGNOSIS — D899 Disorder involving the immune mechanism, unspecified: Secondary | ICD-10-CM | POA: Diagnosis not present

## 2016-05-23 DIAGNOSIS — Z418 Encounter for other procedures for purposes other than remedying health state: Secondary | ICD-10-CM | POA: Diagnosis not present

## 2016-05-23 DIAGNOSIS — D6869 Other thrombophilia: Secondary | ICD-10-CM | POA: Diagnosis not present

## 2016-05-23 DIAGNOSIS — D631 Anemia in chronic kidney disease: Secondary | ICD-10-CM | POA: Diagnosis not present

## 2016-05-23 DIAGNOSIS — Z992 Dependence on renal dialysis: Secondary | ICD-10-CM | POA: Diagnosis not present

## 2016-05-23 DIAGNOSIS — T8611 Kidney transplant rejection: Secondary | ICD-10-CM | POA: Diagnosis not present

## 2016-05-26 DIAGNOSIS — D509 Iron deficiency anemia, unspecified: Secondary | ICD-10-CM | POA: Diagnosis not present

## 2016-05-26 DIAGNOSIS — Z23 Encounter for immunization: Secondary | ICD-10-CM | POA: Diagnosis not present

## 2016-05-26 DIAGNOSIS — N186 End stage renal disease: Secondary | ICD-10-CM | POA: Diagnosis not present

## 2016-05-26 DIAGNOSIS — D6869 Other thrombophilia: Secondary | ICD-10-CM | POA: Diagnosis not present

## 2016-05-26 DIAGNOSIS — D631 Anemia in chronic kidney disease: Secondary | ICD-10-CM | POA: Diagnosis not present

## 2016-05-26 DIAGNOSIS — Z418 Encounter for other procedures for purposes other than remedying health state: Secondary | ICD-10-CM | POA: Diagnosis not present

## 2016-05-28 DIAGNOSIS — D6869 Other thrombophilia: Secondary | ICD-10-CM | POA: Diagnosis not present

## 2016-05-28 DIAGNOSIS — N186 End stage renal disease: Secondary | ICD-10-CM | POA: Diagnosis not present

## 2016-05-28 DIAGNOSIS — D509 Iron deficiency anemia, unspecified: Secondary | ICD-10-CM | POA: Diagnosis not present

## 2016-05-28 DIAGNOSIS — D631 Anemia in chronic kidney disease: Secondary | ICD-10-CM | POA: Diagnosis not present

## 2016-05-28 DIAGNOSIS — Z418 Encounter for other procedures for purposes other than remedying health state: Secondary | ICD-10-CM | POA: Diagnosis not present

## 2016-05-28 DIAGNOSIS — Z23 Encounter for immunization: Secondary | ICD-10-CM | POA: Diagnosis not present

## 2016-05-30 DIAGNOSIS — N186 End stage renal disease: Secondary | ICD-10-CM | POA: Diagnosis not present

## 2016-05-30 DIAGNOSIS — D509 Iron deficiency anemia, unspecified: Secondary | ICD-10-CM | POA: Diagnosis not present

## 2016-05-30 DIAGNOSIS — Z418 Encounter for other procedures for purposes other than remedying health state: Secondary | ICD-10-CM | POA: Diagnosis not present

## 2016-05-30 DIAGNOSIS — D631 Anemia in chronic kidney disease: Secondary | ICD-10-CM | POA: Diagnosis not present

## 2016-05-30 DIAGNOSIS — D6869 Other thrombophilia: Secondary | ICD-10-CM | POA: Diagnosis not present

## 2016-05-30 DIAGNOSIS — Z23 Encounter for immunization: Secondary | ICD-10-CM | POA: Diagnosis not present

## 2016-06-02 DIAGNOSIS — D509 Iron deficiency anemia, unspecified: Secondary | ICD-10-CM | POA: Diagnosis not present

## 2016-06-02 DIAGNOSIS — D6869 Other thrombophilia: Secondary | ICD-10-CM | POA: Diagnosis not present

## 2016-06-02 DIAGNOSIS — D631 Anemia in chronic kidney disease: Secondary | ICD-10-CM | POA: Diagnosis not present

## 2016-06-02 DIAGNOSIS — Z418 Encounter for other procedures for purposes other than remedying health state: Secondary | ICD-10-CM | POA: Diagnosis not present

## 2016-06-02 DIAGNOSIS — Z23 Encounter for immunization: Secondary | ICD-10-CM | POA: Diagnosis not present

## 2016-06-02 DIAGNOSIS — N186 End stage renal disease: Secondary | ICD-10-CM | POA: Diagnosis not present

## 2016-06-04 DIAGNOSIS — D6869 Other thrombophilia: Secondary | ICD-10-CM | POA: Diagnosis not present

## 2016-06-04 DIAGNOSIS — D631 Anemia in chronic kidney disease: Secondary | ICD-10-CM | POA: Diagnosis not present

## 2016-06-04 DIAGNOSIS — Z418 Encounter for other procedures for purposes other than remedying health state: Secondary | ICD-10-CM | POA: Diagnosis not present

## 2016-06-04 DIAGNOSIS — Z23 Encounter for immunization: Secondary | ICD-10-CM | POA: Diagnosis not present

## 2016-06-04 DIAGNOSIS — N186 End stage renal disease: Secondary | ICD-10-CM | POA: Diagnosis not present

## 2016-06-04 DIAGNOSIS — D509 Iron deficiency anemia, unspecified: Secondary | ICD-10-CM | POA: Diagnosis not present

## 2016-06-06 DIAGNOSIS — D631 Anemia in chronic kidney disease: Secondary | ICD-10-CM | POA: Diagnosis not present

## 2016-06-06 DIAGNOSIS — Z418 Encounter for other procedures for purposes other than remedying health state: Secondary | ICD-10-CM | POA: Diagnosis not present

## 2016-06-06 DIAGNOSIS — Z23 Encounter for immunization: Secondary | ICD-10-CM | POA: Diagnosis not present

## 2016-06-06 DIAGNOSIS — N186 End stage renal disease: Secondary | ICD-10-CM | POA: Diagnosis not present

## 2016-06-06 DIAGNOSIS — D509 Iron deficiency anemia, unspecified: Secondary | ICD-10-CM | POA: Diagnosis not present

## 2016-06-06 DIAGNOSIS — D6869 Other thrombophilia: Secondary | ICD-10-CM | POA: Diagnosis not present

## 2016-06-09 DIAGNOSIS — D509 Iron deficiency anemia, unspecified: Secondary | ICD-10-CM | POA: Diagnosis not present

## 2016-06-09 DIAGNOSIS — N186 End stage renal disease: Secondary | ICD-10-CM | POA: Diagnosis not present

## 2016-06-09 DIAGNOSIS — D6869 Other thrombophilia: Secondary | ICD-10-CM | POA: Diagnosis not present

## 2016-06-09 DIAGNOSIS — Z23 Encounter for immunization: Secondary | ICD-10-CM | POA: Diagnosis not present

## 2016-06-09 DIAGNOSIS — D631 Anemia in chronic kidney disease: Secondary | ICD-10-CM | POA: Diagnosis not present

## 2016-06-09 DIAGNOSIS — Z418 Encounter for other procedures for purposes other than remedying health state: Secondary | ICD-10-CM | POA: Diagnosis not present

## 2016-06-11 DIAGNOSIS — Z23 Encounter for immunization: Secondary | ICD-10-CM | POA: Diagnosis not present

## 2016-06-11 DIAGNOSIS — D509 Iron deficiency anemia, unspecified: Secondary | ICD-10-CM | POA: Diagnosis not present

## 2016-06-11 DIAGNOSIS — D6869 Other thrombophilia: Secondary | ICD-10-CM | POA: Diagnosis not present

## 2016-06-11 DIAGNOSIS — N186 End stage renal disease: Secondary | ICD-10-CM | POA: Diagnosis not present

## 2016-06-11 DIAGNOSIS — D631 Anemia in chronic kidney disease: Secondary | ICD-10-CM | POA: Diagnosis not present

## 2016-06-11 DIAGNOSIS — Z418 Encounter for other procedures for purposes other than remedying health state: Secondary | ICD-10-CM | POA: Diagnosis not present

## 2016-06-13 DIAGNOSIS — Z418 Encounter for other procedures for purposes other than remedying health state: Secondary | ICD-10-CM | POA: Diagnosis not present

## 2016-06-13 DIAGNOSIS — Z23 Encounter for immunization: Secondary | ICD-10-CM | POA: Diagnosis not present

## 2016-06-13 DIAGNOSIS — N186 End stage renal disease: Secondary | ICD-10-CM | POA: Diagnosis not present

## 2016-06-13 DIAGNOSIS — D509 Iron deficiency anemia, unspecified: Secondary | ICD-10-CM | POA: Diagnosis not present

## 2016-06-13 DIAGNOSIS — D6869 Other thrombophilia: Secondary | ICD-10-CM | POA: Diagnosis not present

## 2016-06-13 DIAGNOSIS — D631 Anemia in chronic kidney disease: Secondary | ICD-10-CM | POA: Diagnosis not present

## 2016-06-16 DIAGNOSIS — D509 Iron deficiency anemia, unspecified: Secondary | ICD-10-CM | POA: Diagnosis not present

## 2016-06-16 DIAGNOSIS — N186 End stage renal disease: Secondary | ICD-10-CM | POA: Diagnosis not present

## 2016-06-16 DIAGNOSIS — Z23 Encounter for immunization: Secondary | ICD-10-CM | POA: Diagnosis not present

## 2016-06-16 DIAGNOSIS — D631 Anemia in chronic kidney disease: Secondary | ICD-10-CM | POA: Diagnosis not present

## 2016-06-16 DIAGNOSIS — Z418 Encounter for other procedures for purposes other than remedying health state: Secondary | ICD-10-CM | POA: Diagnosis not present

## 2016-06-16 DIAGNOSIS — D6869 Other thrombophilia: Secondary | ICD-10-CM | POA: Diagnosis not present

## 2016-06-18 DIAGNOSIS — D631 Anemia in chronic kidney disease: Secondary | ICD-10-CM | POA: Diagnosis not present

## 2016-06-18 DIAGNOSIS — D6869 Other thrombophilia: Secondary | ICD-10-CM | POA: Diagnosis not present

## 2016-06-18 DIAGNOSIS — Z23 Encounter for immunization: Secondary | ICD-10-CM | POA: Diagnosis not present

## 2016-06-18 DIAGNOSIS — D509 Iron deficiency anemia, unspecified: Secondary | ICD-10-CM | POA: Diagnosis not present

## 2016-06-18 DIAGNOSIS — N186 End stage renal disease: Secondary | ICD-10-CM | POA: Diagnosis not present

## 2016-06-18 DIAGNOSIS — Z418 Encounter for other procedures for purposes other than remedying health state: Secondary | ICD-10-CM | POA: Diagnosis not present

## 2016-06-20 DIAGNOSIS — Z23 Encounter for immunization: Secondary | ICD-10-CM | POA: Diagnosis not present

## 2016-06-20 DIAGNOSIS — D631 Anemia in chronic kidney disease: Secondary | ICD-10-CM | POA: Diagnosis not present

## 2016-06-20 DIAGNOSIS — D509 Iron deficiency anemia, unspecified: Secondary | ICD-10-CM | POA: Diagnosis not present

## 2016-06-20 DIAGNOSIS — N186 End stage renal disease: Secondary | ICD-10-CM | POA: Diagnosis not present

## 2016-06-20 DIAGNOSIS — Z418 Encounter for other procedures for purposes other than remedying health state: Secondary | ICD-10-CM | POA: Diagnosis not present

## 2016-06-20 DIAGNOSIS — D6869 Other thrombophilia: Secondary | ICD-10-CM | POA: Diagnosis not present

## 2016-06-21 DIAGNOSIS — Z992 Dependence on renal dialysis: Secondary | ICD-10-CM | POA: Diagnosis not present

## 2016-06-21 DIAGNOSIS — N186 End stage renal disease: Secondary | ICD-10-CM | POA: Diagnosis not present

## 2016-06-23 DIAGNOSIS — D631 Anemia in chronic kidney disease: Secondary | ICD-10-CM | POA: Diagnosis not present

## 2016-06-23 DIAGNOSIS — Z418 Encounter for other procedures for purposes other than remedying health state: Secondary | ICD-10-CM | POA: Diagnosis not present

## 2016-06-23 DIAGNOSIS — N186 End stage renal disease: Secondary | ICD-10-CM | POA: Diagnosis not present

## 2016-06-23 DIAGNOSIS — N2581 Secondary hyperparathyroidism of renal origin: Secondary | ICD-10-CM | POA: Diagnosis not present

## 2016-06-25 DIAGNOSIS — N2581 Secondary hyperparathyroidism of renal origin: Secondary | ICD-10-CM | POA: Diagnosis not present

## 2016-06-25 DIAGNOSIS — N186 End stage renal disease: Secondary | ICD-10-CM | POA: Diagnosis not present

## 2016-06-25 DIAGNOSIS — Z418 Encounter for other procedures for purposes other than remedying health state: Secondary | ICD-10-CM | POA: Diagnosis not present

## 2016-06-25 DIAGNOSIS — D631 Anemia in chronic kidney disease: Secondary | ICD-10-CM | POA: Diagnosis not present

## 2016-06-25 DIAGNOSIS — E785 Hyperlipidemia, unspecified: Secondary | ICD-10-CM | POA: Diagnosis not present

## 2016-06-27 DIAGNOSIS — N2581 Secondary hyperparathyroidism of renal origin: Secondary | ICD-10-CM | POA: Diagnosis not present

## 2016-06-27 DIAGNOSIS — N186 End stage renal disease: Secondary | ICD-10-CM | POA: Diagnosis not present

## 2016-06-27 DIAGNOSIS — D631 Anemia in chronic kidney disease: Secondary | ICD-10-CM | POA: Diagnosis not present

## 2016-06-27 DIAGNOSIS — Z418 Encounter for other procedures for purposes other than remedying health state: Secondary | ICD-10-CM | POA: Diagnosis not present

## 2016-06-30 DIAGNOSIS — N189 Chronic kidney disease, unspecified: Secondary | ICD-10-CM | POA: Diagnosis not present

## 2016-07-01 DIAGNOSIS — N189 Chronic kidney disease, unspecified: Secondary | ICD-10-CM | POA: Diagnosis not present

## 2016-07-04 DIAGNOSIS — N2581 Secondary hyperparathyroidism of renal origin: Secondary | ICD-10-CM | POA: Diagnosis not present

## 2016-07-04 DIAGNOSIS — N186 End stage renal disease: Secondary | ICD-10-CM | POA: Diagnosis not present

## 2016-07-04 DIAGNOSIS — Z418 Encounter for other procedures for purposes other than remedying health state: Secondary | ICD-10-CM | POA: Diagnosis not present

## 2016-07-04 DIAGNOSIS — D631 Anemia in chronic kidney disease: Secondary | ICD-10-CM | POA: Diagnosis not present

## 2016-07-21 DIAGNOSIS — N186 End stage renal disease: Secondary | ICD-10-CM | POA: Diagnosis not present

## 2016-07-21 DIAGNOSIS — Z992 Dependence on renal dialysis: Secondary | ICD-10-CM | POA: Diagnosis not present

## 2016-07-22 DIAGNOSIS — N186 End stage renal disease: Secondary | ICD-10-CM | POA: Diagnosis not present

## 2016-07-22 DIAGNOSIS — Z992 Dependence on renal dialysis: Secondary | ICD-10-CM | POA: Diagnosis not present

## 2016-07-23 DIAGNOSIS — N186 End stage renal disease: Secondary | ICD-10-CM | POA: Diagnosis not present

## 2016-07-23 DIAGNOSIS — Z992 Dependence on renal dialysis: Secondary | ICD-10-CM | POA: Diagnosis not present

## 2016-07-24 DIAGNOSIS — Z992 Dependence on renal dialysis: Secondary | ICD-10-CM | POA: Diagnosis not present

## 2016-07-24 DIAGNOSIS — N186 End stage renal disease: Secondary | ICD-10-CM | POA: Diagnosis not present

## 2016-07-25 DIAGNOSIS — Z992 Dependence on renal dialysis: Secondary | ICD-10-CM | POA: Diagnosis not present

## 2016-07-25 DIAGNOSIS — N186 End stage renal disease: Secondary | ICD-10-CM | POA: Diagnosis not present

## 2016-07-26 DIAGNOSIS — N186 End stage renal disease: Secondary | ICD-10-CM | POA: Diagnosis not present

## 2016-07-26 DIAGNOSIS — Z992 Dependence on renal dialysis: Secondary | ICD-10-CM | POA: Diagnosis not present

## 2016-07-27 DIAGNOSIS — N186 End stage renal disease: Secondary | ICD-10-CM | POA: Diagnosis not present

## 2016-07-27 DIAGNOSIS — Z992 Dependence on renal dialysis: Secondary | ICD-10-CM | POA: Diagnosis not present

## 2016-07-28 DIAGNOSIS — Z992 Dependence on renal dialysis: Secondary | ICD-10-CM | POA: Diagnosis not present

## 2016-07-28 DIAGNOSIS — N186 End stage renal disease: Secondary | ICD-10-CM | POA: Diagnosis not present

## 2016-07-29 DIAGNOSIS — Z992 Dependence on renal dialysis: Secondary | ICD-10-CM | POA: Diagnosis not present

## 2016-07-29 DIAGNOSIS — N186 End stage renal disease: Secondary | ICD-10-CM | POA: Diagnosis not present

## 2016-07-30 DIAGNOSIS — N186 End stage renal disease: Secondary | ICD-10-CM | POA: Diagnosis not present

## 2016-07-30 DIAGNOSIS — Z992 Dependence on renal dialysis: Secondary | ICD-10-CM | POA: Diagnosis not present

## 2016-07-31 DIAGNOSIS — Z992 Dependence on renal dialysis: Secondary | ICD-10-CM | POA: Diagnosis not present

## 2016-07-31 DIAGNOSIS — N186 End stage renal disease: Secondary | ICD-10-CM | POA: Diagnosis not present

## 2016-08-01 DIAGNOSIS — N186 End stage renal disease: Secondary | ICD-10-CM | POA: Diagnosis not present

## 2016-08-01 DIAGNOSIS — Z992 Dependence on renal dialysis: Secondary | ICD-10-CM | POA: Diagnosis not present

## 2016-08-02 DIAGNOSIS — N186 End stage renal disease: Secondary | ICD-10-CM | POA: Diagnosis not present

## 2016-08-02 DIAGNOSIS — Z992 Dependence on renal dialysis: Secondary | ICD-10-CM | POA: Diagnosis not present

## 2016-08-25 ENCOUNTER — Encounter (HOSPITAL_COMMUNITY): Payer: Self-pay | Admitting: *Deleted

## 2016-08-25 ENCOUNTER — Inpatient Hospital Stay (HOSPITAL_COMMUNITY)
Admission: EM | Admit: 2016-08-25 | Discharge: 2016-08-28 | DRG: 674 | Disposition: A | Discharge status: Home / Self Care | Payer: Medicare Other | Attending: Internal Medicine | Admitting: Internal Medicine

## 2016-08-25 DIAGNOSIS — Z7982 Long term (current) use of aspirin: Secondary | ICD-10-CM

## 2016-08-25 DIAGNOSIS — E875 Hyperkalemia: Secondary | ICD-10-CM | POA: Diagnosis not present

## 2016-08-25 DIAGNOSIS — Z940 Kidney transplant status: Secondary | ICD-10-CM | POA: Diagnosis not present

## 2016-08-25 DIAGNOSIS — R634 Abnormal weight loss: Secondary | ICD-10-CM | POA: Diagnosis present

## 2016-08-25 DIAGNOSIS — N179 Acute kidney failure, unspecified: Principal | ICD-10-CM | POA: Diagnosis present

## 2016-08-25 DIAGNOSIS — I10 Essential (primary) hypertension: Secondary | ICD-10-CM | POA: Diagnosis not present

## 2016-08-25 DIAGNOSIS — R112 Nausea with vomiting, unspecified: Secondary | ICD-10-CM

## 2016-08-25 DIAGNOSIS — N186 End stage renal disease: Secondary | ICD-10-CM | POA: Diagnosis not present

## 2016-08-25 DIAGNOSIS — N19 Unspecified kidney failure: Secondary | ICD-10-CM | POA: Diagnosis not present

## 2016-08-25 DIAGNOSIS — Z6824 Body mass index (BMI) 24.0-24.9, adult: Secondary | ICD-10-CM

## 2016-08-25 DIAGNOSIS — E872 Acidosis: Secondary | ICD-10-CM | POA: Diagnosis not present

## 2016-08-25 DIAGNOSIS — K5900 Constipation, unspecified: Secondary | ICD-10-CM | POA: Diagnosis present

## 2016-08-25 DIAGNOSIS — Z9119 Patient's noncompliance with other medical treatment and regimen: Secondary | ICD-10-CM

## 2016-08-25 DIAGNOSIS — Z8249 Family history of ischemic heart disease and other diseases of the circulatory system: Secondary | ICD-10-CM

## 2016-08-25 DIAGNOSIS — Z9115 Patient's noncompliance with renal dialysis: Secondary | ICD-10-CM

## 2016-08-25 DIAGNOSIS — Z79899 Other long term (current) drug therapy: Secondary | ICD-10-CM

## 2016-08-25 DIAGNOSIS — I120 Hypertensive chronic kidney disease with stage 5 chronic kidney disease or end stage renal disease: Secondary | ICD-10-CM | POA: Diagnosis present

## 2016-08-25 DIAGNOSIS — Z992 Dependence on renal dialysis: Secondary | ICD-10-CM

## 2016-08-25 DIAGNOSIS — Y830 Surgical operation with transplant of whole organ as the cause of abnormal reaction of the patient, or of later complication, without mention of misadventure at the time of the procedure: Secondary | ICD-10-CM | POA: Diagnosis present

## 2016-08-25 DIAGNOSIS — T8612 Kidney transplant failure: Secondary | ICD-10-CM | POA: Diagnosis present

## 2016-08-25 DIAGNOSIS — N289 Disorder of kidney and ureter, unspecified: Secondary | ICD-10-CM | POA: Diagnosis not present

## 2016-08-25 DIAGNOSIS — Z7952 Long term (current) use of systemic steroids: Secondary | ICD-10-CM

## 2016-08-25 DIAGNOSIS — E1122 Type 2 diabetes mellitus with diabetic chronic kidney disease: Secondary | ICD-10-CM | POA: Diagnosis present

## 2016-08-25 LAB — URINALYSIS, ROUTINE W REFLEX MICROSCOPIC
Bilirubin Urine: NEGATIVE
GLUCOSE, UA: NEGATIVE mg/dL
KETONES UR: NEGATIVE mg/dL
Nitrite: NEGATIVE
PH: 5 (ref 5.0–8.0)
Protein, ur: 100 mg/dL — AB
Specific Gravity, Urine: 1.012 (ref 1.005–1.030)

## 2016-08-25 LAB — CBC
HCT: 36.8 % — ABNORMAL LOW (ref 39.0–52.0)
Hemoglobin: 12.6 g/dL — ABNORMAL LOW (ref 13.0–17.0)
MCH: 24.7 pg — ABNORMAL LOW (ref 26.0–34.0)
MCHC: 34.2 g/dL (ref 30.0–36.0)
MCV: 72.2 fL — AB (ref 78.0–100.0)
Platelets: 170 10*3/uL (ref 150–400)
RBC: 5.1 MIL/uL (ref 4.22–5.81)
RDW: 18.4 % — AB (ref 11.5–15.5)
WBC: 13.8 10*3/uL — AB (ref 4.0–10.5)

## 2016-08-25 LAB — COMPREHENSIVE METABOLIC PANEL
ALT: 15 U/L — AB (ref 17–63)
AST: 12 U/L — AB (ref 15–41)
Albumin: 3.7 g/dL (ref 3.5–5.0)
Alkaline Phosphatase: 67 U/L (ref 38–126)
Anion gap: 17 — ABNORMAL HIGH (ref 5–15)
BILIRUBIN TOTAL: 0.3 mg/dL (ref 0.3–1.2)
BUN: 158 mg/dL — AB (ref 6–20)
CO2: 8 mmol/L — ABNORMAL LOW (ref 22–32)
CREATININE: 16.54 mg/dL — AB (ref 0.61–1.24)
Calcium: 9 mg/dL (ref 8.9–10.3)
Chloride: 110 mmol/L (ref 101–111)
GFR, EST AFRICAN AMERICAN: 3 mL/min — AB (ref >60)
GFR, EST NON AFRICAN AMERICAN: 3 mL/min — AB (ref >60)
Glucose, Bld: 115 mg/dL — ABNORMAL HIGH (ref 65–99)
Potassium: 5.6 mmol/L — ABNORMAL HIGH (ref 3.5–5.1)
Sodium: 135 mmol/L (ref 135–145)
Total Protein: 7.3 g/dL (ref 6.5–8.1)

## 2016-08-25 LAB — LIPASE, BLOOD: LIPASE: 35 U/L (ref 11–51)

## 2016-08-25 MED ORDER — SODIUM CHLORIDE 0.9 % IV SOLN: INTRAVENOUS | Status: DC

## 2016-08-25 MED ORDER — ONDANSETRON 4 MG PO TBDP
4.0000 mg | ORAL_TABLET | Freq: Once | ORAL | Status: AC | PRN
  Administered 2016-08-25: 4 mg via ORAL

## 2016-08-25 MED ORDER — ONDANSETRON 4 MG PO TBDP
ORAL_TABLET | ORAL | Status: AC
Start: 2016-08-25 — End: 2016-08-26
  Filled 2016-08-25: qty 1

## 2016-08-25 NOTE — ED Provider Notes (Signed)
[facility] DEPT Provider Note   CSN: [id number] Arrival date & time: 08/25/16  1713     History   Chief Complaint Chief Complaint  Patient presents with  . Constipation  . Emesis    HPI Ryan [name] is a 56 y.o. male.  HPI  The patient is a 56 year old male, very pleasant, history of end-stage renal disease who underwent kidney transplant in 2010. He then subsequently lost his insurance and was unable to afford his antirejection medications and ended up losing his kidney function and going back onto dialysis. He last was on her dialysis approximately 2 months ago but because he improved significantly he was taken off of dialysis again. He is now able to afford his medications, he is taking them regularly but over the last several days to a week he has had progressive nausea vomiting, constipation and diarrhea. He feels generally weak, he denies lightheadedness. There is no fevers or chills, no coughing, no shortness of breath, no orthopnea.  Currently taking Mycophenolate and Tacrolimus   Past Medical History:  Diagnosis Date  . DM2 (diabetes mellitus, type 2) ([place])   . ESRD (end stage renal disease) ([place])   . HTN (hypertension)   . Kidney transplant as cause of abnormal reaction or later complication     Patient Active Problem List   Diagnosis Date Noted  . EKG abnormalities   . Acute renal transplant rejection 03/03/2016  . Acute hyperkalemia 03/03/2016  . Acute kidney failure ([place]) 03/03/2016  . Uremia 03/03/2016  . Metabolic acidosis, normal anion gap (NAG) 03/03/2016    Past Surgical History:  Procedure Laterality Date  . KIDNEY TRANSPLANT       Home Medications    Prior to Admission medications   Medication Sig Start Date End Date Taking? Authorizing Provider  aspirin 325 MG tablet Take 325 mg by mouth every morning.   Yes [provider]  cloNIDine (CATAPRES) 0.2 MG tablet Take 0.2 mg by mouth 2 (two) times daily. 08/24/15  Yes [provider]  enalapril (VASOTEC) 20 MG tablet Take 20 mg by mouth 2 (two) times daily.   Yes [provider]  furosemide (LASIX) 80 MG tablet Take 80 mg by mouth 2 (two) times daily.   Yes [provider]  labetalol (NORMODYNE) 200 MG tablet Take 200 mg by mouth 3 (three) times daily.   Yes [provider]  multivitamin (RENA-VIT) TABS tablet Take 1 tablet by mouth daily.   Yes [provider]  mycophenolate (MYFORTIC) 360 MG TBEC EC tablet Take 360 mg by mouth 2 (two) times daily.   Yes [provider]  NIFEdipine (PROCARDIA XL/ADALAT-CC) 60 MG 24 hr tablet Take 60 mg by mouth 2 (two) times daily.   Yes [provider]  pravastatin (PRAVACHOL) 80 MG tablet Take 80 mg by mouth every morning. 08/16/15  Yes [provider]  prednisoLONE 5 MG TABS tablet Take 5 mg by mouth 2 (two) times daily.   Yes [provider]  PROGRAF 1 MG capsule Take 3 mg by mouth 2 (two) times daily.  07/26/15  Yes [provider]  tacrolimus (PROGRAF) 5 MG capsule Take 5 mg by mouth 2 (two) times daily.   Yes [provider]  heparin 5000 UNIT/ML injection Inject 1 mL (5,000 Units total) into the skin every 8 (eight) hours. Patient not taking: Reported on 08/25/2016 03/04/16   Rama, Venetia Maxon, MD  methylPREDNISolone sodium succinate 500 mg in sodium chloride 0.9 %  50 mL Inject 500 mg into the vein daily. Patient not taking: Reported on 08/25/2016 03/05/16   Rama, Venetia Maxon, MD  sodium bicarbonate 650 MG tablet Take 2 tablets (1,300 mg total) by mouth 2 (two) times daily. Patient not taking: Reported on 08/25/2016 03/04/16   Rama, Venetia Maxon, MD    Family History Family History  Problem Relation Age of Onset  . Hypertension Mother   . Cancer Father     Social History Social History  Substance Use Topics  . Smoking status: Never Smoker  . Smokeless tobacco: Never Used  . Alcohol use No     Allergies   Patient has no known  allergies.   Review of Systems Review of Systems  All other systems reviewed and are negative.   Physical Exam Updated Vital Signs BP (!) 145/80 (BP Location: Right Arm)   Pulse 100   Temp 97.8 F (36.6 C) (Oral)   Resp 16   SpO2 98%   Physical Exam  Constitutional: He appears well-developed and well-nourished. No distress.  HENT:  Head: Normocephalic and atraumatic.  Mouth/Throat: Oropharynx is clear and moist. No oropharyngeal exudate.  Eyes: Conjunctivae and EOM are normal. Pupils are equal, round, and reactive to light. Right eye exhibits no discharge. Left eye exhibits no discharge. No scleral icterus.  Neck: Normal range of motion. Neck supple. No JVD present. No thyromegaly present.  Cardiovascular: Regular rhythm, normal heart sounds and intact distal pulses.  Exam reveals no gallop and no friction rub.   No murmur heard. Borderline tachycardia - no JVD, no edema, normal pulses  Pulmonary/Chest: Effort normal and breath sounds normal. No respiratory distress. He has no wheezes. He has no rales.  Abdominal: Soft. Bowel sounds are normal. He exhibits no distension and no mass. There is no tenderness.  Musculoskeletal: Normal range of motion. He exhibits no edema or tenderness.  Lymphadenopathy:    He has no cervical adenopathy.  Neurological: He is alert. Coordination normal.  Gait is normal, speech is clear, memory is normal  Skin: Skin is warm and dry. No rash noted. No erythema.  Psychiatric: He has a normal mood and affect. His behavior is normal.  Nursing note and vitals reviewed.    ED Treatments / Results  Labs (all labs ordered are listed, but only abnormal results are displayed) Labs Reviewed  COMPREHENSIVE METABOLIC PANEL - Abnormal; Notable for the following:       Result Value   Potassium 5.6 (*)    CO2 8 (*)    Glucose, Bld 115 (*)    BUN 158 (*)    Creatinine, Ser 16.54 (*)    AST 12 (*)    ALT 15 (*)    GFR calc non Af Amer 3 (*)    GFR calc  Af Amer 3 (*)    Anion gap 17 (*)    All other components within normal limits  CBC - Abnormal; Notable for the following:    WBC 13.8 (*)    Hemoglobin 12.6 (*)    HCT 36.8 (*)    MCV 72.2 (*)    MCH 24.7 (*)    RDW 18.4 (*)    All other components within normal limits  URINALYSIS, ROUTINE W REFLEX MICROSCOPIC - Abnormal; Notable for the following:    APPearance HAZY (*)    Hgb urine dipstick LARGE (*)    Protein, ur 100 (*)    Leukocytes, UA MODERATE (*)    Bacteria, UA RARE (*)  Squamous Epithelial / LPF 0-5 (*)    All other components within normal limits  LIPASE, BLOOD    EKG  EKG Interpretation  Date/Time:  Monday August 25 2016 23:17:41 EDT Ventricular Rate:  98 PR Interval:    QRS Duration: 95 QT Interval:  334 QTC Calculation: 427 R Axis:   71 Text Interpretation:  Sinus rhythm Anterior infarct, old Minimal ST elevation, inferior leads since last tracing no significant change Confirmed by Noemi Chapel [phone]) on 08/25/2016 11:22:24 PM       Radiology No results found.  Procedures Procedures (including critical care time)  Medications Ordered in ED Medications  0.9 %  sodium chloride infusion (not administered)  ondansetron (ZOFRAN-ODT) disintegrating tablet 4 mg (4 mg Oral Given 08/25/16 1753)    Initial Impression / Assessment and Plan / ED Course  I have reviewed the triage vital signs and the nursing notes.  Pertinent labs & imaging results that were available during my care of the patient were reviewed by me and considered in my medical decision making (see chart for details).    The patient has what appears to be acute on chronic renal failure. He will most certainly need dialysis in the morning, he does not appear to be acutely ill in need of dialysis at this time though I suspect most of his symptoms are from his uremia. The patient is agreeable. His labs show that he is mildly hyperkalemic but his EKG shows no acute findings to suggest the need for  acute intervention. I discussed his care with the nephrologist, Dr. Joelyn Oms who will see pt in the AM  Will admit to gen med / hospitalist.  IVF  D/w Dr. Tamala Julian who will admit  Final Clinical Impressions(s) / ED Diagnoses   Final diagnoses:  Acute renal failure, unspecified acute renal failure type ([place])  Hyperkalemia  Non-intractable vomiting with nausea, unspecified vomiting type    New Prescriptions New Prescriptions   No medications on file     Noemi Chapel, MD 08/25/16 2359

## 2016-08-25 NOTE — ED Triage Notes (Signed)
Pt reports having ongoing constipation, will be temporarily relieved with laxatives. Now having occ n/v that is getting more frequent over past few days. Reports chills and left side abd pain.

## 2016-08-26 ENCOUNTER — Observation Stay (HOSPITAL_COMMUNITY): Payer: Medicare Other

## 2016-08-26 ENCOUNTER — Encounter (HOSPITAL_COMMUNITY): Payer: Medicare Other

## 2016-08-26 DIAGNOSIS — N186 End stage renal disease: Secondary | ICD-10-CM | POA: Diagnosis not present

## 2016-08-26 DIAGNOSIS — E875 Hyperkalemia: Secondary | ICD-10-CM | POA: Diagnosis not present

## 2016-08-26 DIAGNOSIS — D649 Anemia, unspecified: Secondary | ICD-10-CM | POA: Diagnosis not present

## 2016-08-26 DIAGNOSIS — N19 Unspecified kidney failure: Secondary | ICD-10-CM | POA: Diagnosis not present

## 2016-08-26 DIAGNOSIS — Z79899 Other long term (current) drug therapy: Secondary | ICD-10-CM | POA: Diagnosis not present

## 2016-08-26 DIAGNOSIS — N179 Acute kidney failure, unspecified: Secondary | ICD-10-CM | POA: Diagnosis not present

## 2016-08-26 DIAGNOSIS — R634 Abnormal weight loss: Secondary | ICD-10-CM | POA: Diagnosis present

## 2016-08-26 DIAGNOSIS — Z7982 Long term (current) use of aspirin: Secondary | ICD-10-CM | POA: Diagnosis not present

## 2016-08-26 DIAGNOSIS — E1122 Type 2 diabetes mellitus with diabetic chronic kidney disease: Secondary | ICD-10-CM | POA: Diagnosis present

## 2016-08-26 DIAGNOSIS — Z9115 Patient's noncompliance with renal dialysis: Secondary | ICD-10-CM | POA: Diagnosis not present

## 2016-08-26 DIAGNOSIS — E119 Type 2 diabetes mellitus without complications: Secondary | ICD-10-CM | POA: Diagnosis not present

## 2016-08-26 DIAGNOSIS — Z6824 Body mass index (BMI) 24.0-24.9, adult: Secondary | ICD-10-CM | POA: Diagnosis not present

## 2016-08-26 DIAGNOSIS — N2581 Secondary hyperparathyroidism of renal origin: Secondary | ICD-10-CM | POA: Diagnosis not present

## 2016-08-26 DIAGNOSIS — E872 Acidosis: Secondary | ICD-10-CM | POA: Diagnosis present

## 2016-08-26 DIAGNOSIS — K5900 Constipation, unspecified: Secondary | ICD-10-CM | POA: Diagnosis present

## 2016-08-26 DIAGNOSIS — Z8249 Family history of ischemic heart disease and other diseases of the circulatory system: Secondary | ICD-10-CM | POA: Diagnosis not present

## 2016-08-26 DIAGNOSIS — R1110 Vomiting, unspecified: Secondary | ICD-10-CM | POA: Diagnosis not present

## 2016-08-26 DIAGNOSIS — I10 Essential (primary) hypertension: Secondary | ICD-10-CM | POA: Diagnosis present

## 2016-08-26 DIAGNOSIS — Z940 Kidney transplant status: Secondary | ICD-10-CM

## 2016-08-26 DIAGNOSIS — Z992 Dependence on renal dialysis: Secondary | ICD-10-CM | POA: Diagnosis not present

## 2016-08-26 DIAGNOSIS — Z9119 Patient's noncompliance with other medical treatment and regimen: Secondary | ICD-10-CM | POA: Diagnosis not present

## 2016-08-26 DIAGNOSIS — Z7952 Long term (current) use of systemic steroids: Secondary | ICD-10-CM | POA: Diagnosis not present

## 2016-08-26 DIAGNOSIS — I120 Hypertensive chronic kidney disease with stage 5 chronic kidney disease or end stage renal disease: Secondary | ICD-10-CM | POA: Diagnosis present

## 2016-08-26 DIAGNOSIS — R112 Nausea with vomiting, unspecified: Secondary | ICD-10-CM | POA: Diagnosis not present

## 2016-08-26 DIAGNOSIS — N289 Disorder of kidney and ureter, unspecified: Secondary | ICD-10-CM | POA: Diagnosis not present

## 2016-08-26 DIAGNOSIS — Y830 Surgical operation with transplant of whole organ as the cause of abnormal reaction of the patient, or of later complication, without mention of misadventure at the time of the procedure: Secondary | ICD-10-CM | POA: Diagnosis present

## 2016-08-26 DIAGNOSIS — T8612 Kidney transplant failure: Secondary | ICD-10-CM | POA: Diagnosis present

## 2016-08-26 DIAGNOSIS — N185 Chronic kidney disease, stage 5: Secondary | ICD-10-CM | POA: Diagnosis not present

## 2016-08-26 LAB — BASIC METABOLIC PANEL
ANION GAP: 17 — AB (ref 5–15)
BUN: 162 mg/dL — ABNORMAL HIGH (ref 6–20)
CHLORIDE: 109 mmol/L (ref 101–111)
CO2: 8 mmol/L — AB (ref 22–32)
Calcium: 9.1 mg/dL (ref 8.9–10.3)
Creatinine, Ser: 16.18 mg/dL — ABNORMAL HIGH (ref 0.61–1.24)
GFR calc non Af Amer: 3 mL/min — ABNORMAL LOW (ref >60)
GFR, EST AFRICAN AMERICAN: 3 mL/min — AB (ref >60)
Glucose, Bld: 108 mg/dL — ABNORMAL HIGH (ref 65–99)
POTASSIUM: 5.8 mmol/L — AB (ref 3.5–5.1)
SODIUM: 134 mmol/L — AB (ref 135–145)

## 2016-08-26 LAB — CBC
HEMATOCRIT: 38.9 % — AB (ref 39.0–52.0)
HEMOGLOBIN: 13.2 g/dL (ref 13.0–17.0)
MCH: 24.6 pg — ABNORMAL LOW (ref 26.0–34.0)
MCHC: 33.9 g/dL (ref 30.0–36.0)
MCV: 72.4 fL — ABNORMAL LOW (ref 78.0–100.0)
Platelets: 137 10*3/uL — ABNORMAL LOW (ref 150–400)
RBC: 5.37 MIL/uL (ref 4.22–5.81)
RDW: 18.4 % — ABNORMAL HIGH (ref 11.5–15.5)
WBC: 15 10*3/uL — AB (ref 4.0–10.5)

## 2016-08-26 LAB — MRSA PCR SCREENING: MRSA BY PCR: NEGATIVE

## 2016-08-26 MED ORDER — DEXTROSE 5 % IV SOLN
1.0000 g | Freq: Once | INTRAVENOUS | Status: AC
  Administered 2016-08-26: 1 g via INTRAVENOUS
  Filled 2016-08-26: qty 10

## 2016-08-26 MED ORDER — ACETAMINOPHEN 650 MG RE SUPP
650.0000 mg | Freq: Four times a day (QID) | RECTAL | Status: DC | PRN
Start: 2016-08-26 — End: 2016-08-29

## 2016-08-26 MED ORDER — IPRATROPIUM-ALBUTEROL 0.5-2.5 (3) MG/3ML IN SOLN: 3.0000 mL | Freq: Four times a day (QID) | RESPIRATORY_TRACT | Status: DC | PRN

## 2016-08-26 MED ORDER — NEPRO/CARBSTEADY PO LIQD
237.0000 mL | Freq: Three times a day (TID) | ORAL | Status: DC
  Administered 2016-08-26 – 2016-08-28 (×5): 237 mL via ORAL

## 2016-08-26 MED ORDER — TACROLIMUS 1 MG PO CAPS: 3.0000 mg | ORAL_CAPSULE | Freq: Two times a day (BID) | ORAL | Status: DC

## 2016-08-26 MED ORDER — NEPRO/CARBSTEADY PO LIQD: 237.0000 mL | Freq: Two times a day (BID) | ORAL | Status: DC

## 2016-08-26 MED ORDER — ONDANSETRON HCL 4 MG/2ML IJ SOLN
INTRAMUSCULAR | Status: AC
  Filled 2016-08-26: qty 2

## 2016-08-26 MED ORDER — SODIUM POLYSTYRENE SULFONATE 15 GM/60ML PO SUSP
30.0000 g | Freq: Once | ORAL | Status: AC
  Administered 2016-08-26: 30 g via ORAL
  Filled 2016-08-26: qty 120

## 2016-08-26 MED ORDER — NIFEDIPINE ER 60 MG PO TB24
60.0000 mg | ORAL_TABLET | Freq: Two times a day (BID) | ORAL | Status: DC
  Administered 2016-08-26 – 2016-08-27 (×4): 60 mg via ORAL
  Filled 2016-08-26 (×8): qty 1

## 2016-08-26 MED ORDER — ENALAPRIL MALEATE 5 MG PO TABS
20.0000 mg | ORAL_TABLET | Freq: Two times a day (BID) | ORAL | Status: DC
  Administered 2016-08-26: 20 mg via ORAL
  Filled 2016-08-26: qty 1
  Filled 2016-08-26: qty 4

## 2016-08-26 MED ORDER — DEXTROSE 5 % IV SOLN
1.0000 g | INTRAVENOUS | Status: DC
  Administered 2016-08-27: 1 g via INTRAVENOUS
  Filled 2016-08-26: qty 10

## 2016-08-26 MED ORDER — ASPIRIN 325 MG PO TABS
325.0000 mg | ORAL_TABLET | Freq: Every day | ORAL | Status: DC
  Administered 2016-08-26 – 2016-08-28 (×3): 325 mg via ORAL
  Filled 2016-08-26 (×3): qty 1

## 2016-08-26 MED ORDER — ACETAMINOPHEN 325 MG PO TABS: 650.0000 mg | ORAL_TABLET | Freq: Four times a day (QID) | ORAL | Status: DC | PRN

## 2016-08-26 MED ORDER — TACROLIMUS 1 MG PO CAPS
5.0000 mg | ORAL_CAPSULE | Freq: Two times a day (BID) | ORAL | Status: DC
  Administered 2016-08-26 – 2016-08-28 (×6): 5 mg via ORAL
  Filled 2016-08-26 (×7): qty 5

## 2016-08-26 MED ORDER — HEPARIN SODIUM (PORCINE) 5000 UNIT/ML IJ SOLN
5000.0000 [IU] | Freq: Three times a day (TID) | INTRAMUSCULAR | Status: DC
  Filled 2016-08-26 (×3): qty 1

## 2016-08-26 MED ORDER — PRAVASTATIN SODIUM 40 MG PO TABS
80.0000 mg | ORAL_TABLET | Freq: Every day | ORAL | Status: DC
  Administered 2016-08-26 – 2016-08-28 (×3): 80 mg via ORAL
  Filled 2016-08-26 (×3): qty 2

## 2016-08-26 MED ORDER — CLONIDINE HCL 0.2 MG PO TABS
0.2000 mg | ORAL_TABLET | Freq: Two times a day (BID) | ORAL | Status: DC
  Administered 2016-08-26: 0.2 mg via ORAL
  Filled 2016-08-26: qty 1

## 2016-08-26 MED ORDER — PREDNISOLONE 5 MG PO TABS
5.0000 mg | ORAL_TABLET | Freq: Two times a day (BID) | ORAL | Status: DC
  Administered 2016-08-26: 5 mg via ORAL
  Filled 2016-08-26 (×2): qty 1

## 2016-08-26 MED ORDER — CLONIDINE HCL 0.1 MG PO TABS
0.1000 mg | ORAL_TABLET | Freq: Two times a day (BID) | ORAL | Status: DC
  Administered 2016-08-26 – 2016-08-27 (×2): 0.1 mg via ORAL
  Filled 2016-08-26 (×4): qty 1

## 2016-08-26 MED ORDER — ONDANSETRON HCL 4 MG/2ML IJ SOLN
4.0000 mg | Freq: Four times a day (QID) | INTRAMUSCULAR | Status: DC | PRN
  Administered 2016-08-26 – 2016-08-27 (×3): 4 mg via INTRAVENOUS
  Filled 2016-08-26: qty 2

## 2016-08-26 MED ORDER — LABETALOL HCL 200 MG PO TABS
200.0000 mg | ORAL_TABLET | Freq: Three times a day (TID) | ORAL | Status: DC
  Administered 2016-08-26 – 2016-08-28 (×5): 200 mg via ORAL
  Filled 2016-08-26 (×7): qty 1

## 2016-08-26 MED ORDER — ONDANSETRON HCL 4 MG PO TABS: 4.0000 mg | ORAL_TABLET | Freq: Four times a day (QID) | ORAL | Status: DC | PRN

## 2016-08-26 MED ORDER — SODIUM CHLORIDE 0.9 % IV SOLN
INTRAVENOUS | Status: DC
  Administered 2016-08-26: 02:00:00 via INTRAVENOUS

## 2016-08-26 MED ORDER — FUROSEMIDE 20 MG PO TABS
80.0000 mg | ORAL_TABLET | Freq: Two times a day (BID) | ORAL | Status: DC
Start: 2016-08-26 — End: 2016-08-26

## 2016-08-26 MED ORDER — MYCOPHENOLATE SODIUM 180 MG PO TBEC
360.0000 mg | DELAYED_RELEASE_TABLET | Freq: Two times a day (BID) | ORAL | Status: DC
Start: 2016-08-26 — End: 2016-08-29
  Administered 2016-08-26 – 2016-08-28 (×6): 360 mg via ORAL
  Filled 2016-08-26 (×7): qty 2

## 2016-08-26 MED ORDER — PREDNISONE 10 MG PO TABS
10.0000 mg | ORAL_TABLET | Freq: Every day | ORAL | Status: DC
  Administered 2016-08-26 – 2016-08-28 (×3): 10 mg via ORAL
  Filled 2016-08-26 (×3): qty 1

## 2016-08-26 NOTE — Progress Notes (Signed)
Initial Nutrition Assessment  DOCUMENTATION CODES:   Severe malnutrition in context of acute illness/injury  INTERVENTION:  Encouraged adequate intake of calories and protein with meals to prevent further loss of weight and loss of lean body mass. Encouraged intake of evening snack.   Provide Nepro Shake po TID between meals, each supplement provides 425 kcal and 19 grams protein.   Recommend renal-appropriate multivitamin with minerals (Rena-vite) QHS.  NUTRITION DIAGNOSIS:   Malnutrition (Severe) related to acute illness (N/V) as evidenced by 8.6 percent weight loss over 2 weeks, mild depletion of body fat, moderate depletion of body fat.  GOAL:   Patient will meet greater than or equal to 90% of their needs  MONITOR:   PO intake, Supplement acceptance, Weight trends, Labs, I & O's  REASON FOR ASSESSMENT:   Malnutrition Screening Tool    ASSESSMENT:   56 year old male with PMHx of DM type 2, HTN, ESRD s/p kidney transplantation in 2010, acute rejection in 2017 on intermittent HD, presents with nausea and vomiting over the past 2 weeks found to have AKI on CKD V.   -Per chart patient has not been compliant with his HD. Was recommend three times per week. Last HD was 07/04/2016. Will be re-starting on HD.  Spoke with patient at bedside. He reports he hasn't had an appetite for a couple of weeks now. He has been trying to eat small portions of food but reports he is having N/V. He has been trying to eat bland foods such as crackers, toast, bagels, part of a [employer] sandwich, or some pizza. Reports he has not had anything to eat today. Typical intake is two meals per day. He skips lunch but will eat breakfast and dinner. Patient reports he is familiar with the renal diet. Verbalized understanding of diet components and able to provide examples of foods to limit in diet. However patient had dark soda on tray table. Reports he knows he should not drink these. Discussed some other  options (Gingerale, Sprite). Patient reports he has never had issue with retaining fluid between dialysis sessions.  Reports UBW 185 lbs. Patient reports he has lost his weight from UBW in the past 2 weeks. That would be a weight loss of 15.9 lbs (8.6% body weight) over 2 weeks, which is significant for time frame. Per chart patient was 207.9 lbs on 03/04/2016 so may have lost even more weight over longer period of time.  Medications reviewed and include: prednisone 10 mg daily, tacrolimus 5 mg BID, ceftriaxone, Zofran PRN.  Labs reviewed: Sodium 134, Potassium 5.8, CO2 8, BUN 162, Creatinine 16.18, Anion gap 17, EGFR 3. Pending Phosphorus.  I/O: no fluid removed during HD session today; no urine output charted yet but patient reports he does make urine  Nutrition-Focused physical exam completed. Findings are mild-moderate fat depletion, mild-moderate muscle depletion in temple region, and no edema. Reports his clothes are not fitting well anymore after weight loss these past two weeks.  Diet Order:  Diet renal with fluid restriction Fluid restriction: 1200 mL Fluid; Room service appropriate? Yes; Fluid consistency: Thin  Skin:  Reviewed, no issues  Last BM:  08/26/2016  Height:   Ht Readings from Last 1 Encounters:  08/26/16 _0  (1.778 m)    Weight:   Wt Readings from Last 1 Encounters:  08/26/16 167 lb 8.8 oz (76 kg)    Ideal Body Weight:  75.5 kg  BMI:  Body mass index is 24.04 kg/m.  Estimated Nutritional Needs:  Kcal:  2090-2410 (MSJ x 1.3-1.5)  Protein:  91-114 grams (1.2-1.5 grams/kg)  Fluid:  UOP + 1 L  EDUCATION NEEDS:   No education needs identified at this time  Willey Blade, MS, RD, LDN Pager: [phone] After Hours Pager: [phone]

## 2016-08-26 NOTE — ED Notes (Signed)
Admitting MD at bedside.

## 2016-08-26 NOTE — Procedures (Signed)
I have personally attended this patient's dialysis session.   1st HD since 07/04/16 so dialyzing as if dialysis naive (BUN 162 creatinine 16) K 5.8 2K bath   Jamal Maes, MD [place] Pager 08/26/2016, 3:24 PM

## 2016-08-26 NOTE — Consult Note (Signed)
[facility]  VASCULAR SURGERY ASSESSMENT & PLAN:   Vein map pending. Will make further recommendations pending these results. Will tentatively schedule him for a left AVF/AVG on Thursday if medically ready and patient is willing (he is considering peritoneal dialysis)  [name], MD, [place] [phone] Office: [phone]   History of Present Illness:  Patient is a 56 y.o. year old male who presents for placement of a permanent hemodialysis access. The patient is right handed.  The patient is currently on hemodialysis.  .  Other chronic medical problems include  HTN, ESRD, and s/p right kidney transplantion 2010 on chronic immunosuppressive therapy; who presents with complaints of progressively worsening nausea and vomiting over last 2 weeks.  At admission creatinine 16.54.    We have been requested to provide HD access.   He is interested in peri tenial dyalisis instead of a fistula.    Past Medical History:  Diagnosis Date  . DM2 (diabetes mellitus, type 2) ([place])   . ESRD (end stage renal disease) ([place])   . HTN (hypertension)   . Kidney transplant as cause of abnormal reaction or later complication     Past Surgical History:  Procedure Laterality Date  . KIDNEY TRANSPLANT       Social History Social History  Substance Use Topics  . Smoking status: Never Smoker  . Smokeless tobacco: Never Used  . Alcohol use No    Family History Family History  Problem Relation Age of Onset  . Hypertension Mother   . Cancer Father     Allergies  No Known Allergies   Current Facility-Administered Medications  Medication Dose Route Frequency Provider Last Rate Last Dose  . acetaminophen (TYLENOL) tablet 650 mg  650 mg Oral Q6H PRN [name], MD       Or  . acetaminophen (TYLENOL) suppository 650 mg  650 mg Rectal Q6H PRN Fuller Plan A, MD      . aspirin tablet 325 mg  325 mg Oral Daily Tamala Julian, Rondell A, MD      . Derrill Memo ON  08/27/2016] cefTRIAXone (ROCEPHIN) 1 g in dextrose 5 % 50 mL IVPB  1 g Intravenous Q24H Honor Loh, RPH      . cloNIDine (CATAPRES) tablet 0.2 mg  0.2 mg Oral BID Tamala Julian, Rondell A, MD   0.2 mg at 08/26/16 0255  . feeding supplement (NEPRO CARB STEADY) liquid 237 mL  237 mL Oral BID BM Smith, Rondell A, MD      . heparin injection 5,000 Units  5,000 Units Subcutaneous Q8H Smith, Rondell A, MD      . ipratropium-albuterol (DUONEB) 0.5-2.5 (3) MG/3ML nebulizer solution 3 mL  3 mL Nebulization Q6H PRN Smith, Rondell A, MD      . labetalol (NORMODYNE) tablet 200 mg  200 mg Oral TID Fuller Plan A, MD      . mycophenolate (MYFORTIC) EC tablet 360 mg  360 mg Oral BID Fuller Plan A, MD   360 mg at 08/26/16 0255  . NIFEdipine (PROCARDIA-XL/ADALAT CC) 24 hr tablet 60 mg  60 mg Oral BID Fuller Plan A, MD   60 mg at 08/26/16 0306  . ondansetron (ZOFRAN) tablet 4 mg  4 mg Oral Q6H PRN Fuller Plan A, MD       Or  . ondansetron (ZOFRAN) injection 4 mg  4 mg Intravenous Q6H PRN Fuller Plan A, MD   4 mg at 08/26/16 0127  . pravastatin (PRAVACHOL) tablet 80  mg  80 mg Oral q1800 Fuller Plan A, MD      . predniSONE (DELTASONE) tablet 10 mg  10 mg Oral Q breakfast Jamal Maes, MD      . tacrolimus (PROGRAF) capsule 5 mg  5 mg Oral BID Fuller Plan A, MD   5 mg at 08/26/16 0256    ROS:   General:  No weight loss, Fever, chills  HEENT: No recent headaches, no nasal bleeding, no visual changes, no sore throat  Neurologic: No dizziness, blackouts, seizures. No recent symptoms of stroke or mini- stroke. No recent episodes of slurred speech, or temporary blindness.  Cardiac: No recent episodes of chest pain/pressure, no shortness of breath at rest.  No shortness of breath with exertion.  Denies history of atrial fibrillation or irregular heartbeat  Vascular: No history of rest pain in feet.  No history of claudication.  No history of non-healing ulcer, No history of DVT   Pulmonary: No  home oxygen, no productive cough, no hemoptysis,  No asthma or wheezing  Musculoskeletal:  [ ]  Arthritis, [ ]  Low back pain,  [ ]  Joint pain  Hematologic:No history of hypercoagulable state.  No history of easy bleeding.  No history of anemia  Gastrointestinal: No hematochezia or melena,  No gastroesophageal reflux, no trouble swallowing  Urinary: [x ] chronic Kidney disease, [x ] on HD - [ ]  MWF or [ ]  TTHS, [ ]  Burning with urination, [ ]  Frequent urination, [ ]  Difficulty urinating;   Skin: No rashes  Psychological: No history of anxiety,  No history of depression   Physical Examination  Vitals:   08/25/16 2330 08/26/16 0000 08/26/16 0200 08/26/16 0605  BP: 130/78 (!) 145/85 138/66 130/73  Pulse: (!) 101 (!) 104 (!) 112 (!) 108  Resp: 20 18 18 18   Temp:   98.5 F (36.9 C) 97.9 F (36.6 C)  TempSrc:   Oral Oral  SpO2: 94% 98% 100% 97%    There is no height or weight on file to calculate BMI.  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    PLAN: Pending vein mapping and options of fistula verses graft verses peritoneal  HD the patient will make a decision.    COLLINS, EMMA MAUREEN PA-C [facility]

## 2016-08-26 NOTE — Care Management Obs Status (Signed)
[facility] NOTIFICATION   Patient Details  Name: SOURISH ALLENDER MRN: [id number] Date of Birth: 20-Sep-1960   Medicare Observation Status Notification Given:  Yes    Jo-Anne Kluth, Rory Percy, RN 08/26/2016, 1:09 PM

## 2016-08-26 NOTE — ED Notes (Signed)
attemlted iv x2 unsuccessful

## 2016-08-26 NOTE — Progress Notes (Signed)
New Admission Note:  Arrival Method: By bed from the ED around 0300 Mental Orientation: Alert and oriented Telemetry: [address], CCMD notified Assessment: Completed Skin: Completed, refer to flowsheets IV: Left AC Pain: Denies Tubes: Right HD cath present on admin. Safety Measures: Safety Fall Prevention Plan was given, discussed and signed. Admission: Completed [address] Orientation: Patient has been orientated to the room, unit and the staff. Family: None  Orders have been reviewed and implemented. Will continue to monitor the patient. Call light has been placed within reach.  Perry Mount, RN  Phone Number: [phone]

## 2016-08-26 NOTE — H&P (Signed)
History and Physical    Ryan [name] ION:[id number] DOB: November 16, 1960 DOA: 08/25/2016  Referring MD/NP/PA: Noemi Chapel, MD PCP: Patient, No Pcp Per  Patient coming from: home  Chief Complaint: Nausea and vomiting  HPI: Ryan [name] is a 56 y.o. 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 Symptoms were initially intermittent, but had more persistent the last few days. Patient states that he had previously lost some of its benefits back in June 2017 for which he had had no way to pay for his antirejection medications. Subsequently, in November 2017 he was found to be in acute rejection and was admitted to [facility] for further treatment. During his hospitalization he underwent 12 days of plasmapheresis with IVIG, and subsequent hemodialysis. He had been on hemodialysis up until February/March of this year when he reports he was told that he no longer needed the dialysis as he is making good urine and creatinine levels had trended down to as low as 4.86. Denies any leg swelling, shortness of breath, change in weight, fever, chills, nausea, vomiting  Follow-up by Dr. Ivette Loyal of nephrology at [facility].  ED Course: Upon admission into the emergency department patient was seen to be afebrile, heart rate is 96-104, respirations 16-20, blood pressures 145/85, and oxygen saturation maintained on room air. Labs revealed WBC 13.8, hemoglobin 12.6, potassium 5.6, BUN 158, creatinine 16.54, anion gap 17. Dr. Pearson Grippe of nephrology was consulted and recommended admission for likely need dialysis.    Review of Systems: As per HPI otherwise 10 point review of systems negative.   Past Medical History:  Diagnosis Date  . DM2 (diabetes mellitus, type 2) ([place])   . ESRD (end stage renal disease) ([place])    . HTN (hypertension)   . Kidney transplant as cause of abnormal reaction or later complication     Past Surgical History:  Procedure Laterality Date  . KIDNEY TRANSPLANT       reports that he has never smoked. He has never used smokeless tobacco. He reports that he does not drink alcohol or use drugs.  No Known Allergies  Family History  Problem Relation Age of Onset  . Hypertension Mother   . Cancer Father     Prior to Admission medications   Medication Sig Start Date End Date Taking? Authorizing Provider  aspirin 325 MG tablet Take 325 mg by mouth every morning.    [provider]  Cholecalciferol (VITAMIN D3) 10000 units capsule Take 10,000 Units by mouth daily.    [provider]  cloNIDine (CATAPRES) 0.2 MG tablet Take 0.2 mg by mouth 2 (two) times daily. 08/24/15   [provider]  heparin 5000 UNIT/ML injection Inject 1 mL (5,000 Units total) into the skin every 8 (eight) hours. 03/04/16   Rama, Venetia Maxon, MD  methylPREDNISolone sodium succinate 500 mg in sodium chloride 0.9 % 50 mL Inject 500 mg into the vein daily. 03/05/16   Rama, Venetia Maxon, MD  mycophenolate (MYFORTIC) 180 MG EC tablet Take 720 mg by mouth 2 (two) times daily.  07/24/15   [provider]  Omega-3 Fatty Acids (FISH OIL) 1000 MG CAPS Take 1,000 mg by mouth daily.    [provider]  pravastatin (PRAVACHOL) 80 MG tablet Take 80 mg by mouth every morning. 08/16/15   [provider]  PROGRAF 1  MG capsule Take 7 mg by mouth 2 (two) times daily. 07/26/15   [provider]  sodium bicarbonate 650 MG tablet Take 2 tablets (1,300 mg total) by mouth 2 (two) times daily. 03/04/16   Rama, Venetia Maxon, MD    Physical Exam:  Constitutional: Obese male in NAD, calm, comfortable Vitals:   08/25/16 1747 08/25/16 2023 08/25/16 2244  BP: 109/74 110/86 (!) 145/80  Pulse: 97 96 100  Resp: 16 16 16   Temp: 98 F (36.7 C) 97.8 F (36.6 C)   TempSrc: Oral  Oral   SpO2: 99% 99% 98%   Eyes: PERRL, lids and conjunctivae normal ENMT: Mucous membranes are moist. Posterior pharynx clear of any exudate or lesions.Normal dentition.  Neck: normal, supple, no masses, no thyromegaly Respiratory: clear to auscultation bilaterally, no wheezing, no crackles. Normal respiratory effort. No accessory muscle use.  Cardiovascular: Regular rate and rhythm, no murmurs / rubs / gallops. No extremity edema. 2+ pedal pulses. No carotid bruits. Catheter present in the right upper chest wall. Abdomen: no tenderness, no masses palpated. No hepatosplenomegaly. Bowel sounds positive.  Musculoskeletal: no clubbing / cyanosis. No joint deformity upper and lower extremities. Good ROM, no contractures. Normal muscle tone.  Skin: no rashes, lesions, ulcers. No induration Neurologic: CN 2-12 grossly intact. Sensation intact, DTR normal. Strength 5/5 in all 4.  Psychiatric: Normal judgment and insight. Alert and oriented x 3. Normal mood.     Labs on Admission: I have personally reviewed following labs and imaging studies  CBC:  Recent Labs Lab 08/25/16 1803  WBC 13.8*  HGB 12.6*  HCT 36.8*  MCV 72.2*  PLT 630   Basic Metabolic Panel:  Recent Labs Lab 08/25/16 1803  NA 135  K 5.6*  CL 110  CO2 8*  GLUCOSE 115*  BUN 158*  CREATININE 16.54*  CALCIUM 9.0   GFR: CrCl cannot be calculated (Unknown ideal weight.). Liver Function Tests:  Recent Labs Lab 08/25/16 1803  AST 12*  ALT 15*  ALKPHOS 67  BILITOT 0.3  PROT 7.3  ALBUMIN 3.7    Recent Labs Lab 08/25/16 1803  LIPASE 35   No results for input(s): AMMONIA in the last 168 hours. Coagulation Profile: No results for input(s): INR, PROTIME in the last 168 hours. Cardiac Enzymes: No results for input(s): CKTOTAL, CKMB, CKMBINDEX, TROPONINI in the last 168 hours. BNP (last 3 results) No results for input(s): PROBNP in the last 8760 hours. HbA1C: No results for input(s): HGBA1C in the last 72  hours. CBG: No results for input(s): GLUCAP in the last 168 hours. Lipid Profile: No results for input(s): CHOL, HDL, LDLCALC, TRIG, CHOLHDL, LDLDIRECT in the last 72 hours. Thyroid Function Tests: No results for input(s): TSH, T4TOTAL, FREET4, T3FREE, THYROIDAB in the last 72 hours. Anemia Panel: No results for input(s): VITAMINB12, FOLATE, FERRITIN, TIBC, IRON, RETICCTPCT in the last 72 hours. Urine analysis:    Component Value Date/Time   COLORURINE YELLOW 08/25/2016 1926   APPEARANCEUR HAZY (A) 08/25/2016 1926   LABSPEC 1.012 08/25/2016 1926   PHURINE 5.0 08/25/2016 1926   GLUCOSEU NEGATIVE 08/25/2016 1926   HGBUR LARGE (A) 08/25/2016 1926   BILIRUBINUR NEGATIVE 08/25/2016 [place] NEGATIVE 08/25/2016 1926   PROTEINUR 100 (A) 08/25/2016 1926   NITRITE NEGATIVE 08/25/2016 1926   LEUKOCYTESUR MODERATE (A) 08/25/2016 1926   Sepsis Labs: No results found for this or any previous visit (from the past 240 hour(s)).   Radiological Exams on Admission: No results found.  EKG:  Independently reviewed. Sinus rhythm similar to previous tracings  Assessment/Plan Uremia with nausea and vomiting: Acute. Patient with elevated BUN of 158. Suspected symptoms of nausea and vomiting likely contributed for patient's end-stage renal disease. - Admit to telemetry bed - IV fluids NS at 100 ml/hr overnight - Check acute abdominal series  Hyperkalemia: Acute. Potassium elevated at 5.6. - Recheck BMP in a.m.  ESRD on HD, s/p Kidney Transplant:  Patient Cr 16.54 and BUN 158. Patient's last Prograf level was checked in 04/09/2016 and noted to be 8.3.  Dr. Joelyn Oms on nephrology was consulted and will likely dialyze patient in a.m.  - Per nephrology - Check Prograf level - Continue Myfortic, Prograf, and prednisone  Essential hypertension - Continue Vasotec, labetalol, clonidine and nifedipine  - Restart furosemide when medically appropriate  DVT prophylaxis: Heparin   Code Status:  Full Family Communication: No family present at bedside Disposition Plan: Likely discharge home once medically stable Consults called: Nephrology Admission status: Observation [name] MD [facility] Pager [phone]  If 7PM-7AM, please contact night-coverage [email or web address] Password [email or web address]  08/25/2016, 11:52 PM

## 2016-08-26 NOTE — Progress Notes (Signed)
PROGRESS NOTE    [name]  ZOX:[id number] DOB: 07/20/1960 DOA: 08/25/2016 PCP: Patient, No Pcp Per  Brief Narrative:Ryan [name] is a 56 y.o. 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 In 2017 had Acute rejection and Rx with IVIG, plasmapharesis and was getting intermittent HD, pt gives conflicting info that he was told would only require PRN HD, last HD in March, then was in [place] for few weeks, now back in [place] and came to ER last pm  Assessment & Plan:   1. ESRD/ h/o renal transplant in 2010 followed by Rejection in 2017 -has HD cathter, last HD in March per pt report -REnal consulting -stop IVF -HD per Renal  2. Failed Renal transplant -on prograf/myfortic and prednisone -doubt he was taking any of this, will need to be weaned  3. Doubt UTI -stop ceftriaxone  4. HTN -BP stable, cut down clonidine, on nifedipine/labetalol, wean down meds once starts HD  5. Non compliance -suspected  6. H/o DM per EMR, not on meds -check hba1c  DVT prophylaxis: Heparin   Code Status: Full Family Communication: No family present at bedside Disposition Plan: home pending stability, HD clip etc  Consultants:  Renal  Subjective: Poor appetite, ongoing N/V  Objective: Vitals:   08/26/16 0000 08/26/16 0200 08/26/16 0605 08/26/16 0928  BP: (!) 145/85 138/66 130/73 125/85  Pulse: (!) 104 (!) 112 (!) 108 (!) 101  Resp: 18 18 18 18   Temp:  98.5 F (36.9 C) 97.9 F (36.6 C) 98.5 F (36.9 C)  TempSrc:  Oral Oral Oral  SpO2: 98% 100% 97% 98%    Intake/Output Summary (Last 24 hours) at 08/26/16 1121 Last data filed at 08/26/16 8119  Gross per 24 hour  Intake           448.33 ml  Output                0 ml    Net           448.33 ml   There were no vitals filed for this visit.  Examination:  General exam: Appears calm and comfortable, no distress Respiratory system: Clear to auscultation. Respiratory effort normal. R chest HD catheter Cardiovascular system: S1 & S2 heard, RRR. No JVD, murmurs, rubs Gastrointestinal system: Abdomen is nondistended, soft and nontender. Normal bowel sounds heard. Central nervous system: Alert and oriented. No focal neurological deficits. Extremities: Symmetric 5 x 5 power. Skin: No rashes, lesions or ulcers Psychiatry: Judgement and insight appear normal. Mood & affect appropriate.     Data Reviewed:   CBC:  Recent Labs Lab 08/25/16 1803 08/26/16 0135  WBC 13.8* 15.0*  HGB 12.6* 13.2  HCT 36.8* 38.9*  MCV 72.2* 72.4*  PLT 170 147*   Basic Metabolic Panel:  Recent Labs Lab 08/25/16 1803 08/26/16 0135  NA 135 134*  K 5.6* 5.8*  CL 110 109  CO2 8* 8*  GLUCOSE 115* 108*  BUN 158* 162*  CREATININE 16.54* 16.18*  CALCIUM 9.0 9.1   GFR: CrCl cannot be calculated (Unknown ideal weight.). Liver Function Tests:  Recent Labs Lab 08/25/16 1803  AST 12*  ALT 15*  ALKPHOS 67  BILITOT 0.3  PROT 7.3  ALBUMIN 3.7    Recent Labs Lab 08/25/16 1803  LIPASE 35   No results for input(s): AMMONIA in the last 168 hours. Coagulation Profile: No results for input(s): INR, PROTIME in the last 168 hours. Cardiac Enzymes: No results for input(s): CKTOTAL, CKMB, CKMBINDEX, TROPONINI in the last 168 hours. BNP (last 3 results) No results for input(s): PROBNP in the last 8760 hours. HbA1C: No results for input(s): HGBA1C in the last 72 hours. CBG: No results for input(s): GLUCAP in the last 168 hours. Lipid Profile: No results for input(s): CHOL, HDL, LDLCALC, TRIG, CHOLHDL, LDLDIRECT in the last 72 hours. Thyroid Function Tests: No results for input(s): TSH, T4TOTAL, FREET4, T3FREE, THYROIDAB in the last 72 hours. Anemia Panel: No  results for input(s): VITAMINB12, FOLATE, FERRITIN, TIBC, IRON, RETICCTPCT in the last 72 hours. Urine analysis:    Component Value Date/Time   COLORURINE YELLOW 08/25/2016 1926   APPEARANCEUR HAZY (A) 08/25/2016 1926   LABSPEC 1.012 08/25/2016 1926   PHURINE 5.0 08/25/2016 1926   GLUCOSEU NEGATIVE 08/25/2016 1926   HGBUR LARGE (A) 08/25/2016 [place] NEGATIVE 08/25/2016 [place] NEGATIVE 08/25/2016 1926   PROTEINUR 100 (A) 08/25/2016 1926   NITRITE NEGATIVE 08/25/2016 1926   LEUKOCYTESUR MODERATE (A) 08/25/2016 1926   Sepsis Labs: @LABRCNTIP (procalcitonin:4,lacticidven:4)  ) Recent Results (from the past 240 hour(s))  MRSA PCR Screening     Status: None   Collection Time: 08/26/16  2:33 AM  Result Value Ref Range Status   MRSA by PCR NEGATIVE NEGATIVE Final    Comment:        The GeneXpert MRSA Assay (FDA approved for NASAL specimens only), is one component of a comprehensive MRSA colonization surveillance program. It is not intended to diagnose MRSA infection nor to guide or monitor treatment for MRSA infections.          Radiology Studies: Dg Abd Acute W/chest  Result Date: 08/26/2016 CLINICAL DATA:  Nausea and vomiting.  Chills. EXAM: DG ABDOMEN ACUTE W/ 1V CHEST COMPARISON:  None. FINDINGS: Right-sided catheter tip at the atrial caval junction. The heart is normal in size. Normal mediastinal contours. No consolidation, pulmonary edema or pleural fluid. No dilated bowel loops to suggest obstruction. There is increased air within small bowel in the central abdomen. Small volume of colonic stool. No evidence of free air. Presumed ureteral stent within the right lower quadrant transplant kidney. No acute osseous abnormalities. IMPRESSION: 1. No evidence of bowel obstruction or free air. 2. Mild increased air within normal caliber small bowel, may be normal for this patient or seen with enteritis or mild ileus. 3. Presumed ureteral stent within a right  lower quadrant transplant kidney. Electronically Signed   By: Jeb Levering M.D.   On: 08/26/2016 01:53        Scheduled Meds: . aspirin  325 mg Oral Daily  . cloNIDine  0.2 mg Oral BID  . feeding supplement (NEPRO CARB STEADY)  237 mL Oral BID BM  . heparin  5,000 Units Subcutaneous Q8H  . labetalol  200 mg Oral TID  . mycophenolate  360 mg Oral BID  . NIFEdipine  60 mg Oral BID  . pravastatin  80 mg Oral q1800  . predniSONE  10 mg Oral Q breakfast  . tacrolimus  5 mg Oral BID   Continuous Infusions: . [START ON 08/27/2016] cefTRIAXone (ROCEPHIN)  IV       LOS: 0 days    Time spent: 3min    Domenic Polite, MD [facility] Pager [phone]  If 7PM-7AM, please contact  night-coverage [email or web address] Password [email or web address] 08/26/2016, 11:21 AM

## 2016-08-26 NOTE — Progress Notes (Signed)
Pharmacy Antibiotic Note  Ryan [name] is a 56 y.o. male admitted on 08/25/2016 with UTI.  Pharmacy has been consulted for ceftriaxone dosing. WBC elevated at 13.8 and afebrile.   Plan: Ceftriaxone 1g IV q24hr Monitor clinical picture and culture data  Follow-up length of therapy    Temp (24hrs), Avg:97.9 F (36.6 C), Min:97.8 F (36.6 C), Max:98 F (36.7 C)   Recent Labs Lab 08/25/16 1803  WBC 13.8*  CREATININE 16.54*    CrCl cannot be calculated (Unknown ideal weight.).    No Known Allergies  Antimicrobials this admission: 6/5 CTX >   Dose adjustments this admission: n/a  Microbiology results: pending   Argie Ramming, PharmD Pharmacy Resident  Pager [phone] 08/26/16 1:37 AM

## 2016-08-26 NOTE — Consult Note (Signed)
[facility] Consultation Note Requesting Physician:  [facility] Primary Nephrologist: [place] Dr. Ivette Loyal (previously [facility] locally) Reason for Consult:  Uremia  HPI: 56 yo AAM. PMH ESRD, HTN. S/p LURD  renal xplant. Well know to me because of longstanding medical noncompliance, failure to keep clinic appts. 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 Dialysis catheter was placed at some point  and was receiving HD via [facility] (notes from 06/2016 indicating TIW dialysis was recommended). Was advised to make appt with me - never did.  Last labs from [facility] 07/01/16 BUN 122 Creatinine 7.6. Last HD was 07/04/16 at [facility]. Presents to ED with N/V/constipation, BUN 162, creatinine 16. We are called to provide HD and arrange locally.  Creatinine, Ser  Date/Time Value Ref Range Status  08/26/2016 01:35 AM 16.18 (H) 0.61 - 1.24 mg/dL Final  08/25/2016 06:03 PM 16.54 (H) 0.61 - 1.24 mg/dL Final  03/04/2016 02:50 PM 11.15 (H) 0.61 - 1.24 mg/dL Final  03/04/2016 02:11 AM 11.13 (H) 0.61 - 1.24 mg/dL Final  03/03/2016 08:17 PM 11.00 (H) 0.61 - 1.24 mg/dL Final  03/03/2016 04:05 PM 11.16 (H) 0.61 - 1.24 mg/dL Final    Past Medical History:  Diagnosis Date  . DM2 (diabetes mellitus, type 2) ([place])   . ESRD (end stage renal disease) ([place])   . HTN (hypertension)   . Kidney transplant as cause of abnormal reaction or later complication    Past Surgical History:  Procedure Laterality Date  . KIDNEY TRANSPLANT     Family History  Problem Relation Age of Onset  . Hypertension Mother   . Cancer Father    Social History:  reports that he has never smoked. He has never used smokeless tobacco. He reports that he does not drink alcohol or use drugs.  Allergies: No Known Allergies  Home medications: Prior to Admission medications   Medication Sig Start Date End Date Taking? Authorizing Provider  aspirin 325 MG tablet Take 325 mg by mouth every morning.   Yes [provider]  cloNIDine (CATAPRES)  0.2 MG tablet Take 0.2 mg by mouth 2 (two) times daily. 08/24/15  Yes [provider]  enalapril (VASOTEC) 20 MG tablet Take 20 mg by mouth 2 (two) times daily.   Yes [provider]  furosemide (LASIX) 80 MG tablet Take 80 mg by mouth 2 (two) times daily.   Yes [provider]  labetalol (NORMODYNE) 200 MG tablet Take 200 mg by mouth 3 (three) times daily.   Yes [provider]  multivitamin (RENA-VIT) TABS tablet Take 1 tablet by mouth daily.   Yes [provider]  mycophenolate (MYFORTIC) 360 MG TBEC EC tablet Take 360 mg by mouth 2 (two) times daily.   Yes [provider]  NIFEdipine (PROCARDIA XL/ADALAT-CC) 60 MG 24 hr tablet Take 60 mg by mouth 2 (two) times daily.   Yes [provider]  pravastatin (PRAVACHOL) 80 MG tablet Take 80 mg by mouth every morning. 08/16/15  Yes [provider]  prednisoLONE 5 MG TABS tablet Take 5 mg by mouth 2 (two) times daily.   Yes [provider]  PROGRAF 1 MG capsule Take 3 mg by mouth 2 (two) times daily.  07/26/15  Yes [provider]  tacrolimus (PROGRAF) 5 MG capsule Take 5 mg by mouth 2 (two) times daily.   Yes [provider]  heparin 5000 UNIT/ML injection Inject 1 mL (5,000 Units total) into the skin every 8 (eight) hours. Patient not taking: Reported  on 08/25/2016 03/04/16   Rama, Venetia Maxon, MD  methylPREDNISolone sodium succinate 500 mg in sodium chloride 0.9 % 50 mL Inject 500 mg into the vein daily. Patient not taking: Reported on 08/25/2016 03/05/16   Rama, Venetia Maxon, MD  sodium bicarbonate 650 MG tablet Take 2 tablets (1,300 mg total) by mouth 2 (two) times daily. Patient not taking: Reported on 08/25/2016 03/04/16   Rama, Venetia Maxon, MD    Inpatient medications: . aspirin  325 mg Oral Daily  . cloNIDine  0.2 mg Oral BID  . feeding supplement (NEPRO CARB STEADY)  237 mL Oral BID BM  . heparin  5,000 Units Subcutaneous Q8H  . labetalol  200 mg Oral  TID  . mycophenolate  360 mg Oral BID  . NIFEdipine  60 mg Oral BID  . pravastatin  80 mg Oral q1800  . prednisoLONE  5 mg Oral BID  . sodium polystyrene  30 g Oral Once  . tacrolimus  5 mg Oral BID    Review of Systems See HPI  Physical Exam:  Blood pressure 130/73, pulse (!) 108, temperature 97.9 F (36.6 C), temperature source Oral, resp. rate 18, SpO2 97 %.  Gen: Soft spoken AAM NAD R sided TDC with dressing not changed since April 2018 Skin warm and dry, no rash Lungs clear S1S2 No S3 Abd non distended and non tender R renal allograft non-tender No edema of LE's + asterixus  Dialysis Access: R IJ TDC (? Placed ?)    Recent Labs Lab 08/25/16 1803 08/26/16 0135  NA 135 134*  K 5.6* 5.8*  CL 110 109  CO2 8* 8*  GLUCOSE 115* 108*  BUN 158* 162*  CREATININE 16.54* 16.18*  CALCIUM 9.0 9.1    Recent Labs Lab 08/25/16 1803  AST 12*  ALT 15*  ALKPHOS 67  BILITOT 0.3  PROT 7.3  ALBUMIN 3.7    Recent Labs Lab 08/25/16 1803  LIPASE 35    Recent Labs Lab 08/25/16 1803 08/26/16 0135  WBC 13.8* 15.0*  HGB 12.6* 13.2  HCT 36.8* 38.9*  MCV 72.2* 72.4*  PLT 170 137*    Xrays/Other Studies: Dg Abd Acute W/chest  Result Date: 08/26/2016 CLINICAL DATA:  Nausea and vomiting.  Chills. EXAM: DG ABDOMEN ACUTE W/ 1V CHEST COMPARISON:  None. FINDINGS: Right-sided catheter tip at the atrial caval junction. The heart is normal in size. Normal mediastinal contours. No consolidation, pulmonary edema or pleural fluid. No dilated bowel loops to suggest obstruction. There is increased air within small bowel in the central abdomen. Small volume of colonic stool. No evidence of free air. Presumed ureteral stent within the right lower quadrant transplant kidney. No acute osseous abnormalities. IMPRESSION: 1. No evidence of bowel obstruction or free air. 2. Mild increased air within normal caliber small bowel, may be normal for this patient or seen with enteritis or mild  ileus. 3. Presumed ureteral stent within a right lower quadrant transplant kidney. Electronically Signed   By: Jeb Levering M.D.   On: 08/26/2016 01:53    Background: 56 yo AAM. PMH ESRD, HTN. S/p LURD renal xplant 2010.  Well know to me because of longstanding medical noncompliance, failure to keep clinic appts. Most recent F/U has been at [facility]. 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 Did not get full recovery, Dialysis catheter was placed at some point  and was receiving HD via [facility] (notes from 06/2016 indicating TIW dialysis was recommended). Was advised to make appt with me -  never did.  Last labs from [facility] 07/01/16 BUN 122 Creatinine 7.6. Last HD was 07/04/16. Presents to ED with N/V/constipation, BUN 162, creatinine 16. We are called to provide HD and arrange locally.  Assessment/Recommendations  1. 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 Was recommended by [facility] docs to continue TIW. Last HD was 07/04/16 at [facility]. (Pt has not been totally forthcoming with these details). Has ESRD. Needs to resume HD. Daily for 3 days. CLIP. Arrange for AVF/AVG. Thankfully his [facility] that has not been accessed since April is functional. 2. Failed renal transplant - unclear exactly WHAT he has been taking. Last seen at [facility] and they wanted to keep on TAC/MMF to prevent as much ongoing Ab formation as possible. (I personally don't think he would be an appropriate candidate for re-transplantation because of his significant h/o poor compliance (even when had meds) 3. HTN - Meds. Looks euvolemic. Stop IVF's.  4. Hyperkalemia - will correct with HD. 5. 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 Appears was supposed to have been on Renvela as outpt   Jamal Maes,  MD [facility] [phone] pager 08/26/2016, 8:36 AM

## 2016-08-27 ENCOUNTER — Inpatient Hospital Stay (HOSPITAL_COMMUNITY): Payer: Medicare Other

## 2016-08-27 DIAGNOSIS — N19 Unspecified kidney failure: Secondary | ICD-10-CM

## 2016-08-27 DIAGNOSIS — N186 End stage renal disease: Secondary | ICD-10-CM

## 2016-08-27 LAB — HEPATITIS B SURFACE ANTIGEN: HEP B S AG: NEGATIVE

## 2016-08-27 LAB — RENAL FUNCTION PANEL
ALBUMIN: 3 g/dL — AB (ref 3.5–5.0)
ANION GAP: 19 — AB (ref 5–15)
BUN: 107 mg/dL — ABNORMAL HIGH (ref 6–20)
CO2: 14 mmol/L — ABNORMAL LOW (ref 22–32)
Calcium: 7.9 mg/dL — ABNORMAL LOW (ref 8.9–10.3)
Chloride: 101 mmol/L (ref 101–111)
Creatinine, Ser: 11.5 mg/dL — ABNORMAL HIGH (ref 0.61–1.24)
GFR calc Af Amer: 5 mL/min — ABNORMAL LOW (ref >60)
GFR calc non Af Amer: 4 mL/min — ABNORMAL LOW (ref >60)
GLUCOSE: 128 mg/dL — AB (ref 65–99)
PHOSPHORUS: 8.2 mg/dL — AB (ref 2.5–4.6)
POTASSIUM: 3.6 mmol/L (ref 3.5–5.1)
Sodium: 134 mmol/L — ABNORMAL LOW (ref 135–145)

## 2016-08-27 LAB — HEPATITIS B E ANTIGEN: Hep B E Ag: NEGATIVE

## 2016-08-27 LAB — PARATHYROID HORMONE, INTACT (NO CA): PTH: 187 pg/mL — ABNORMAL HIGH (ref 15–65)

## 2016-08-27 LAB — HEPATITIS B CORE ANTIBODY, TOTAL: Hep B Core Total Ab: NEGATIVE

## 2016-08-27 MED ORDER — SEVELAMER CARBONATE 800 MG PO TABS
1600.0000 mg | ORAL_TABLET | Freq: Three times a day (TID) | ORAL | Status: DC
  Administered 2016-08-27 – 2016-08-28 (×3): 1600 mg via ORAL
  Filled 2016-08-27 (×3): qty 2

## 2016-08-27 MED ORDER — CEFAZOLIN SODIUM-DEXTROSE 1-4 GM/50ML-% IV SOLN
1.0000 g | INTRAVENOUS | Status: AC
  Administered 2016-08-28: 1 g via INTRAVENOUS
  Filled 2016-08-27: qty 50

## 2016-08-27 MED ORDER — ONDANSETRON HCL 4 MG/2ML IJ SOLN
INTRAMUSCULAR | Status: AC
  Administered 2016-08-27: 4 mg via INTRAVENOUS
  Filled 2016-08-27: qty 2

## 2016-08-27 NOTE — Care Management Note (Signed)
Case Management Note  Patient Details  Name: JERAMIAH MCCAUGHEY MRN: [id number] Date of Birth: Oct 21, 1960  Subjective/Objective:      CM following for progression and d/c planning.               Action/Plan: Noted CM consult re housing isssues for this pt who currently lives with his daughter and son in law. Request discussed with and forwarded to CSW, Crawford Givens who is aware of pt concerns and is providing information. Will follow for DME and/or HH needs.   Expected Discharge Date:                  Expected Discharge Plan:  Home/Self Care  In-House Referral:  Clinical Social Work  Discharge planning Services  CM Consult  Post Acute Care Choice:  NA Choice offered to:  NA  DME Arranged:    DME Agency:     HH Arranged:    [place] Agency:     Status of Service:  Completed, signed off  If discussed at [employer], dates discussed:    Additional Comments:  Adron Bene, RN 08/27/2016, 12:35 PM

## 2016-08-27 NOTE — Progress Notes (Signed)
[facility] Rounding Note  Subjective/Interval History:  Had 2nd HD today Mild cramps in hands only issue For HD #3 tomorrow then back onto a schedule Agreeable to AVF or AVG tomorrow Worried about his housing situation  Says "devastated by life back on HD - but was ON HD until he just stopped going after his April 13 treatment...)  Objective Vital signs in last 24 hours: Vitals:   08/27/16 1000 08/27/16 1014 08/27/16 1017 08/27/16 1106  BP: 120/74 120/75 (!) 146/88 122/78  Pulse: 90 86 96 (!) 104  Resp:   19 17  Temp:   98.1 F (36.7 C) 98.4 F (36.9 C)  TempSrc:   Oral Oral  SpO2:   97% 99%  Weight:   75.4 kg (166 lb 3.6 oz)   Height:       Weight change:   Intake/Output Summary (Last 24 hours) at 08/27/16 1211 Last data filed at 08/27/16 1107  Gross per 24 hour  Intake              470 ml  Output              200 ml  Net              270 ml   Physical Exam:  Blood pressure 122/78, pulse (!) 104, temperature 98.4 F (36.9 C), temperature source Oral, resp. rate 17, height 5\' 10"  (1.778 m), weight 75.4 kg (166 lb 3.6 oz), SpO2 99 %.  Soft spoken AAM NAD Depressed R sided TDC Skin warm and dry, no rash Lungs clear S1S2 No S3 Abd non distended and non tender R renal allograft non-tender No edema of LE's   Recent Labs Lab 08/25/16 1803 08/26/16 0135 08/27/16 0647  NA 135 134* 134*  K 5.6* 5.8* 3.6  CL 110 109 101  CO2 8* 8* 14*  GLUCOSE 115* 108* 128*  BUN 158* 162* 107*  CREATININE 16.54* 16.18* 11.50*  CALCIUM 9.0 9.1 7.9*  PHOS  --   --  8.2*    Recent Labs Lab 08/25/16 1803 08/27/16 0647  AST 12*  --   ALT 15*  --   ALKPHOS 67  --   BILITOT 0.3  --   PROT 7.3  --   ALBUMIN 3.7 3.0*    Recent Labs Lab 08/25/16 1803  LIPASE 35    Recent Labs Lab 08/25/16 1803 08/26/16 0135  WBC 13.8* 15.0*  HGB 12.6* 13.2  HCT 36.8* 38.9*  MCV 72.2* 72.4*  PLT 170 137*   Results for Ryan [name], Ryan [name] (MRN [id number]) as of 08/27/2016 12:14  Ref.  Range 08/26/2016 12:30  PTH Latest Ref Range: 15 - 65 pg/mL 187 (H)   Studies/Results: Dg Abd Acute W/chest  Result Date: 08/26/2016 CLINICAL DATA:  Nausea and vomiting.  Chills. EXAM: DG ABDOMEN ACUTE W/ 1V CHEST COMPARISON:  None. FINDINGS: Right-sided catheter tip at the atrial caval junction. The heart is normal in size. Normal mediastinal contours. No consolidation, pulmonary edema or pleural fluid. No dilated bowel loops to suggest obstruction. There is increased air within small bowel in the central abdomen. Small volume of colonic stool. No evidence of free air. Presumed ureteral stent within the right lower quadrant transplant kidney. No acute osseous abnormalities. IMPRESSION: 1. No evidence of bowel obstruction or free air. 2. Mild increased air within normal caliber small bowel, may be normal for this patient or seen with enteritis or mild ileus. 3. Presumed ureteral stent within a right lower quadrant transplant  kidney. Electronically Signed   By: Jeb Levering M.D.   On: 08/26/2016 01:53   Medications: . cefTRIAXone (ROCEPHIN)  IV Stopped (08/27/16 0256)   . aspirin  325 mg Oral Daily  . cloNIDine  0.1 mg Oral BID  . feeding supplement (NEPRO CARB STEADY)  237 mL Oral TID BM  . heparin  5,000 Units Subcutaneous Q8H  . labetalol  200 mg Oral TID  . mycophenolate  360 mg Oral BID  . NIFEdipine  60 mg Oral BID  . pravastatin  80 mg Oral q1800  . predniSONE  10 mg Oral Q breakfast  . tacrolimus  5 mg Oral BID     Background: 56 yo AAM. PMH ESRD, HTN. S/p LURD renal xplant 2010.  Well know to me because of longstanding medical noncompliance, failure to keep clinic appts. Most recent F/U has been at [facility]. 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 Did not get full recovery, Dialysis catheter was placed at some point  and was receiving HD via [facility] (notes from 06/2016 indicating TIW dialysis was recommended). Was advised to make appt with me - never did.  Last labs from  [facility] 07/01/16 BUN 122 Creatinine 7.6. Last HD was 07/04/16. Presents to ED with N/V/constipation, BUN 162, creatinine 16. We are called to provide HD and arrange locally.  Assessment/Recommendations  1. 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 Was recommended by [facility] docs to continue TIW. Last HD was 07/04/16 at [facility]. (Pt was not totally forthcoming with these details). Has ESRD. Resumed HD 6/5 and had again today. HD tomorrow then back on a schedule. CLIP pending. Does agree to AVF/AVG (will not approve for PD until better compliance track record, and also because current housing situation up in the air).  2. Failed renal transplant - unclear exactly WHAT he has been taking. Last seen at [facility] and they wanted to keep on TAC/MMF to prevent as much ongoing Ab formation as possible. (I personally don't think he would be an appropriate candidate for re-transplantation because of his significant h/o poor compliance even when meds paid for). Keep on current doses of TAC, MMF, pred for now.  3. HTN - Meds. Looks euvolemic.  4. Hyperkalemia - Corrected with HD. 5. 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 Phos 8.2 binders resumed (Renvela 2 ac).    Jamal Maes, MD [facility] [phone] pager 08/27/2016, 12:11 PM

## 2016-08-27 NOTE — Progress Notes (Signed)
Patient is currently in Dialysis.  Handoff report received from night shift nurse Jequetta.

## 2016-08-27 NOTE — Progress Notes (Addendum)
   VASCULAR SURGERY ASSESSMENT & PLAN:   We were consult and for hemodialysis access. The patient has a functioning right IJ tunneled dialysis catheter. The patient thinks that he would prefer to have peritoneal dialysis. He is tentatively on the schedule tomorrow for placement of a left arm AV fistula or graft however, if the patient does not wish to proceed then certainly we can cancel this. I am in the office today and I will have one of the physician's assistants talk with him later to see what his decision is.  His vein map is pending. He is right-handed. Unfortunately, an IV was started in the left arm after I saw him yesterday. Pending the results of his vein map, and if he is willing to proceed with surgery, we may have to move his IV to the other arm.   SUBJECTIVE:   Feels better this morning. He had significant nausea yesterday.  PHYSICAL EXAM:   Vitals:   08/26/16 1536 08/26/16 1700 08/26/16 2047 08/27/16 0423  BP: 125/85 112/71 (!) 99/57 107/76  Pulse: (!) 101 96 97 93  Resp:  19 19   Temp:  98.5 F (36.9 C) 98.3 F (36.8 C) 98.5 F (36.9 C)  TempSrc:  Oral Oral Oral  SpO2:  97% 98% 95%  Weight: 167 lb 8.8 oz (76 kg)  167 lb 8.8 oz (76 kg)   Height: 5\' 10"  (1.778 m)      Palpable radial pulses.  LABS:   Lab Results  Component Value Date   WBC 15.0 (H) 08/26/2016   HGB 13.2 08/26/2016   HCT 38.9 (L) 08/26/2016   MCV 72.4 (L) 08/26/2016   PLT 137 (L) 08/26/2016   Lab Results  Component Value Date   CREATININE 16.18 (H) 08/26/2016   No results found for: INR, PROTIME CBG (last 3)  No results for input(s): GLUCAP in the last 72 hours.  PROBLEM LIST:    Principal Problem:   Uremia Active Problems:   Metabolic acidosis   Nausea and vomiting   Renal transplant, status post   Essential hypertension   ESRD (end stage renal disease) (HCC)   CURRENT MEDS:   . aspirin  325 mg Oral Daily  . cloNIDine  0.1 mg Oral BID  . feeding supplement (NEPRO CARB  STEADY)  237 mL Oral TID BM  . heparin  5,000 Units Subcutaneous Q8H  . labetalol  200 mg Oral TID  . mycophenolate  360 mg Oral BID  . NIFEdipine  60 mg Oral BID  . pravastatin  80 mg Oral q1800  . predniSONE  10 mg Oral Q breakfast  . tacrolimus  5 mg Oral BID    Gae Gallop Beeper: [phone] Office: [phone] 08/27/2016

## 2016-08-27 NOTE — Progress Notes (Signed)
Consent signed and placed in chart for AV fistula placement.

## 2016-08-27 NOTE — Progress Notes (Signed)
[facility] Progress Note  Patient: Ryan [name] BWG:[id number]   PCP: Patient, No Pcp Per DOB: [birth year]   DOA: 08/25/2016   DOS: 08/27/2016   Date of Service: the patient was seen and examined on 08/27/2016  Subjective: Feeling better, no acute complaint. Seen on hemodialysis  Brief hospital course: Pt. with PMH of HTN, ESRD S/P renal transplant on 2010, currently failed, acute rejection 2017; admitted on 08/25/2016, presented with complaint of nausea vomiting and fatigue, was found to have worsened renal function requiring hemodialysis. Currently further plan is to arrange outpatient HD.  Assessment and Plan: 1. ESRD Renal transplant 2010 for blood ejection 2017. Since his acute rejection in 2017 the patient has been on HD when necessary. Although her documentation patient was recommended to continue HD 3 times a week. Nephrology consulted and appreciated input. Started on hemodialysis via the catheter which is still functioning. Vascular surgery consulted for AV graft placement. Not a candidate for peritoneal dialysis due to lack of compliance as well as poor housing situation. Continue Prograf and prednisone.  2. Essential hypertension. Blood pressure currently well controlled. Continuing home regimen. May need to titrate medication depending on hemodialysis compliance.  3. Severe hyperkalemia. Potassium significantly elevated on admission due to lack of renal clearance. Currently potassium significantly better with hemodialysis. Daily monitor.  4. Leucocytosis.  Likely hemoconcentration. No evidence of active infection. No indication for antibiotics at present. Continue to monitor.  Diet: renal diet DVT Prophylaxis: subcutaneous Heparin  Advance goals of care discussion: full code  Family Communication: no family was present at bedside, at the time of interview.  Disposition:  Discharge to home.  Consultants: nephrology, [facility] Procedures:  HD  Antibiotics: Anti-infectives    Start     Dose/Rate Route Frequency Ordered Stop   08/27/16 0300  cefTRIAXone (ROCEPHIN) 1 g in dextrose 5 % 50 mL IVPB     1 g 100 mL/hr over 30 Minutes Intravenous Every 24 hours 08/26/16 0143     08/26/16 0145  cefTRIAXone (ROCEPHIN) 1 g in dextrose 5 % 50 mL IVPB     1 g 100 mL/hr over 30 Minutes Intravenous  Once 08/26/16 0133 08/26/16 0530       Objective: Physical Exam: Vitals:   08/27/16 1000 08/27/16 1014 08/27/16 1017 08/27/16 1106  BP: 120/74 120/75 (!) 146/88 122/78  Pulse: 90 86 96 (!) 104  Resp:   19 17  Temp:   98.1 F (36.7 C) 98.4 F (36.9 C)  TempSrc:   Oral Oral  SpO2:   97% 99%  Weight:   75.4 kg (166 lb 3.6 oz)   Height:        Intake/Output Summary (Last 24 hours) at 08/27/16 1717 Last data filed at 08/27/16 1715  Gross per 24 hour  Intake              350 ml  Output              200 ml  Net              150 ml   Filed Weights   08/26/16 2047 08/27/16 0658 08/27/16 1017  Weight: 76 kg (167 lb 8.8 oz) 75.5 kg (166 lb 7.2 oz) 75.4 kg (166 lb 3.6 oz)   General: Alert, Awake and Oriented to Time, Place and Person. Appear in mild distress, affect appropriate Eyes: PERRL, Conjunctiva normal ENT: Oral Mucosa clear moist. Neck: difficult to assess JVD, no Abnormal Mass Or lumps Cardiovascular: S1 and S2  Present, no Murmur, Peripheral Pulses Present Respiratory: normal respiratory effort, Bilateral Air entry equal and Decreased, no use of accessory muscle, Clear to Auscultation, no Crackles, no wheezes Abdomen: Bowel Sound present, Soft and no tenderness, no hernia Skin: no redness, no Rash, no induration Extremities: no Pedal edema, no calf tenderness Neurologic: Grossly no focal neuro deficit. Bilaterally Equal motor strength  Data Reviewed: CBC:  Recent Labs Lab 08/25/16 1803 08/26/16 0135  WBC 13.8* 15.0*  HGB 12.6* 13.2  HCT 36.8* 38.9*  MCV 72.2* 72.4*  PLT 170 378*   Basic Metabolic  Panel:  Recent Labs Lab 08/25/16 1803 08/26/16 0135 08/27/16 0647  NA 135 134* 134*  K 5.6* 5.8* 3.6  CL 110 109 101  CO2 8* 8* 14*  GLUCOSE 115* 108* 128*  BUN 158* 162* 107*  CREATININE 16.54* 16.18* 11.50*  CALCIUM 9.0 9.1 7.9*  PHOS  --   --  8.2*    Liver Function Tests:  Recent Labs Lab 08/25/16 1803 08/27/16 0647  AST 12*  --   ALT 15*  --   ALKPHOS 67  --   BILITOT 0.3  --   PROT 7.3  --   ALBUMIN 3.7 3.0*    Recent Labs Lab 08/25/16 1803  LIPASE 35   No results for input(s): AMMONIA in the last 168 hours. Coagulation Profile: No results for input(s): INR, PROTIME in the last 168 hours. Cardiac Enzymes: No results for input(s): CKTOTAL, CKMB, CKMBINDEX, TROPONINI in the last 168 hours. BNP (last 3 results) No results for input(s): PROBNP in the last 8760 hours. CBG: No results for input(s): GLUCAP in the last 168 hours. Studies: No results found.  Scheduled Meds: . aspirin  325 mg Oral Daily  . cloNIDine  0.1 mg Oral BID  . feeding supplement (NEPRO CARB STEADY)  237 mL Oral TID BM  . heparin  5,000 Units Subcutaneous Q8H  . labetalol  200 mg Oral TID  . mycophenolate  360 mg Oral BID  . NIFEdipine  60 mg Oral BID  . pravastatin  80 mg Oral q1800  . predniSONE  10 mg Oral Q breakfast  . sevelamer carbonate  1,600 mg Oral TID WC  . tacrolimus  5 mg Oral BID   Continuous Infusions: . cefTRIAXone (ROCEPHIN)  IV Stopped (08/27/16 0256)   PRN Meds: acetaminophen **OR** acetaminophen, ipratropium-albuterol, ondansetron **OR** ondansetron (ZOFRAN) IV  Time spent: 35 minutes  Author: Berle Mull, MD [facility] Pager: [phone] 08/27/2016 5:17 PM  If 7PM-7AM, please contact night-coverage at [email or web address], password [facility]

## 2016-08-27 NOTE — Progress Notes (Signed)
Vein mapping reviewed.  Pt has adequate vein for left brachiocephalic AV fistula.  Discussed with pt and he is still unsure about proceeding.  I have discussed with him that we will move his IV from the left to the right arm to protect the vein.  Also discussed with him that we will leave him on the OR as scheduled and not to eat or drink anything after MN if he decides to proceed.    Leontine Locket, [facility] 08/27/2016 2:19 PM

## 2016-08-27 NOTE — Progress Notes (Signed)
Upper Extremity Vein Map  Right Cephalic  Segment Diameter Depth Comment  1. Axilla 4.75mm 5.49mm   2. Mid upper arm 4.66mm 2.17mm   3. Above AC 2.53mm 4.52mm Branch  4. In [facility] 5.67mm 1.57mm   5. Below AC 3.45mm 3.55mm Branch  6. Mid forearm 3.30mm 2.34mm   7. Wrist 1.86mm 2.9mm Multiple branches                  Right Basilic Not visualized   Left Cephalic  Segment Diameter Depth Comment  1. Axilla 3.41mm 3.16mm   2. Mid upper arm 4.30mm 3.14mm   3. Above [facility] 3.42mm 3.31mm Branch  4. In [facility] 4.70mm 2.13mm   5. Below AC 1.54mm 4.33mm   6. Mid forearm 1.53mm 3.65mm Branch  7. Wrist 1.110mm 2.73mm                   Basilic Not visualized  03/27/863 11:55 AM Maudry Mayhew, BS, RVT, RDCS, RDMS

## 2016-08-28 ENCOUNTER — Encounter (HOSPITAL_COMMUNITY)
Admission: EM | Disposition: A | Discharge status: Home / Self Care | Payer: Self-pay | Source: Home / Self Care | Attending: Internal Medicine

## 2016-08-28 ENCOUNTER — Inpatient Hospital Stay (HOSPITAL_COMMUNITY): Payer: Medicare Other | Admitting: Certified Registered Nurse Anesthetist

## 2016-08-28 ENCOUNTER — Encounter (HOSPITAL_COMMUNITY): Payer: Self-pay | Admitting: Certified Registered Nurse Anesthetist

## 2016-08-28 DIAGNOSIS — T8612 Kidney transplant failure: Secondary | ICD-10-CM | POA: Insufficient documentation

## 2016-08-28 DIAGNOSIS — N2581 Secondary hyperparathyroidism of renal origin: Secondary | ICD-10-CM | POA: Insufficient documentation

## 2016-08-28 DIAGNOSIS — D689 Coagulation defect, unspecified: Secondary | ICD-10-CM | POA: Insufficient documentation

## 2016-08-28 DIAGNOSIS — E1122 Type 2 diabetes mellitus with diabetic chronic kidney disease: Secondary | ICD-10-CM | POA: Insufficient documentation

## 2016-08-28 HISTORY — PX: AV FISTULA PLACEMENT: SHX1204

## 2016-08-28 LAB — CBC
HEMATOCRIT: 34.2 % — AB (ref 39.0–52.0)
HEMOGLOBIN: 11.3 g/dL — AB (ref 13.0–17.0)
MCH: 23.8 pg — ABNORMAL LOW (ref 26.0–34.0)
MCHC: 33 g/dL (ref 30.0–36.0)
MCV: 72 fL — AB (ref 78.0–100.0)
Platelets: 175 10*3/uL (ref 150–400)
RBC: 4.75 MIL/uL (ref 4.22–5.81)
RDW: 17.8 % — ABNORMAL HIGH (ref 11.5–15.5)
WBC: 11 10*3/uL — AB (ref 4.0–10.5)

## 2016-08-28 LAB — BASIC METABOLIC PANEL
Anion gap: 14 (ref 5–15)
BUN: 65 mg/dL — AB (ref 6–20)
CHLORIDE: 97 mmol/L — AB (ref 101–111)
CO2: 25 mmol/L (ref 22–32)
Calcium: 8.4 mg/dL — ABNORMAL LOW (ref 8.9–10.3)
Creatinine, Ser: 8.59 mg/dL — ABNORMAL HIGH (ref 0.61–1.24)
GFR calc Af Amer: 7 mL/min — ABNORMAL LOW (ref >60)
GFR, EST NON AFRICAN AMERICAN: 6 mL/min — AB (ref >60)
Glucose, Bld: 122 mg/dL — ABNORMAL HIGH (ref 65–99)
POTASSIUM: 3.5 mmol/L (ref 3.5–5.1)
SODIUM: 136 mmol/L (ref 135–145)

## 2016-08-28 LAB — URINE CULTURE

## 2016-08-28 LAB — TACROLIMUS LEVEL: TACROLIMUS (FK506) - LABCORP: 2.3 ng/mL (ref 2.0–20.0)

## 2016-08-28 LAB — SURGICAL PCR SCREEN
MRSA, PCR: NEGATIVE
STAPHYLOCOCCUS AUREUS: POSITIVE — AB

## 2016-08-28 LAB — GLUCOSE, CAPILLARY: Glucose-Capillary: 102 mg/dL — ABNORMAL HIGH (ref 65–99)

## 2016-08-28 LAB — PROTIME-INR
INR: 1.01
Prothrombin Time: 13.3 seconds (ref 11.4–15.2)

## 2016-08-28 SURGERY — ARTERIOVENOUS (AV) FISTULA CREATION
Anesthesia: Monitor Anesthesia Care | Site: Arm Upper | Laterality: Left

## 2016-08-28 MED ORDER — NEPRO/CARBSTEADY PO LIQD: 237.0000 mL | Freq: Three times a day (TID) | ORAL | 0 refills | Status: DC

## 2016-08-28 MED ORDER — ONDANSETRON HCL 4 MG/2ML IJ SOLN
INTRAMUSCULAR | Status: DC | PRN
  Administered 2016-08-28: 4 mg via INTRAVENOUS

## 2016-08-28 MED ORDER — MIDAZOLAM HCL 5 MG/5ML IJ SOLN
INTRAMUSCULAR | Status: DC | PRN
  Administered 2016-08-28: 2 mg via INTRAVENOUS

## 2016-08-28 MED ORDER — LIDOCAINE 2% (20 MG/ML) 5 ML SYRINGE
INTRAMUSCULAR | Status: DC | PRN
  Administered 2016-08-28: 40 mg via INTRAVENOUS

## 2016-08-28 MED ORDER — PROPOFOL 10 MG/ML IV BOLUS
INTRAVENOUS | Status: DC | PRN
  Administered 2016-08-28 (×2): 20 mg via INTRAVENOUS
  Administered 2016-08-28: 160 mg via INTRAVENOUS

## 2016-08-28 MED ORDER — HEPARIN SODIUM (PORCINE) 5000 UNIT/ML IJ SOLN: 5000.0000 [IU] | Freq: Three times a day (TID) | INTRAMUSCULAR | Status: DC

## 2016-08-28 MED ORDER — PROPOFOL 500 MG/50ML IV EMUL
INTRAVENOUS | Status: DC | PRN
  Administered 2016-08-28: 75 ug/kg/min via INTRAVENOUS

## 2016-08-28 MED ORDER — ONDANSETRON HCL 4 MG/2ML IJ SOLN: 4.0000 mg | Freq: Once | INTRAMUSCULAR | Status: DC | PRN

## 2016-08-28 MED ORDER — FENTANYL CITRATE (PF) 250 MCG/5ML IJ SOLN
INTRAMUSCULAR | Status: AC
  Filled 2016-08-28: qty 5

## 2016-08-28 MED ORDER — MEPERIDINE HCL 25 MG/ML IJ SOLN: 6.2500 mg | INTRAMUSCULAR | Status: DC | PRN

## 2016-08-28 MED ORDER — PROPOFOL 10 MG/ML IV BOLUS
INTRAVENOUS | Status: AC
  Filled 2016-08-28: qty 20

## 2016-08-28 MED ORDER — OXYCODONE HCL 5 MG PO TABS: 5.0000 mg | ORAL_TABLET | Freq: Four times a day (QID) | ORAL | Status: DC | PRN

## 2016-08-28 MED ORDER — EPHEDRINE 5 MG/ML INJ
INTRAVENOUS | Status: AC
  Filled 2016-08-28: qty 10

## 2016-08-28 MED ORDER — PROTAMINE SULFATE 10 MG/ML IV SOLN
INTRAVENOUS | Status: DC | PRN
  Administered 2016-08-28: 20 mg via INTRAVENOUS
  Administered 2016-08-28: 10 mg via INTRAVENOUS

## 2016-08-28 MED ORDER — 0.9 % SODIUM CHLORIDE (POUR BTL) OPTIME
TOPICAL | Status: DC | PRN
  Administered 2016-08-28: 1000 mL

## 2016-08-28 MED ORDER — SODIUM CHLORIDE 0.9 % IV SOLN
INTRAVENOUS | Status: DC
  Administered 2016-08-28 (×2): via INTRAVENOUS

## 2016-08-28 MED ORDER — HEPARIN SODIUM (PORCINE) 1000 UNIT/ML IJ SOLN
INTRAMUSCULAR | Status: DC | PRN
  Administered 2016-08-28: 7000 [IU] via INTRAVENOUS

## 2016-08-28 MED ORDER — CLONIDINE HCL 0.1 MG PO TABS: 0.1000 mg | ORAL_TABLET | Freq: Two times a day (BID) | ORAL | 0 refills | Status: DC

## 2016-08-28 MED ORDER — LIDOCAINE HCL 1 % IJ SOLN
INTRAMUSCULAR | Status: AC
  Filled 2016-08-28: qty 20

## 2016-08-28 MED ORDER — PHENYLEPHRINE 40 MCG/ML (10ML) SYRINGE FOR IV PUSH (FOR BLOOD PRESSURE SUPPORT)
PREFILLED_SYRINGE | INTRAVENOUS | Status: AC
  Filled 2016-08-28: qty 10

## 2016-08-28 MED ORDER — PHENYLEPHRINE HCL 10 MG/ML IJ SOLN
INTRAMUSCULAR | Status: DC | PRN
  Administered 2016-08-28 (×2): 80 ug via INTRAVENOUS
  Administered 2016-08-28 (×2): 120 ug via INTRAVENOUS

## 2016-08-28 MED ORDER — SODIUM CHLORIDE 0.9 % IV SOLN
INTRAVENOUS | Status: DC | PRN
  Administered 2016-08-28: 10:00:00

## 2016-08-28 MED ORDER — FENTANYL CITRATE (PF) 100 MCG/2ML IJ SOLN: 25.0000 ug | INTRAMUSCULAR | Status: DC | PRN

## 2016-08-28 MED ORDER — LIDOCAINE HCL (PF) 1 % IJ SOLN
INTRAMUSCULAR | Status: DC | PRN
  Administered 2016-08-28: 20 mL

## 2016-08-28 MED ORDER — LIDOCAINE 2% (20 MG/ML) 5 ML SYRINGE
INTRAMUSCULAR | Status: AC
  Filled 2016-08-28: qty 5

## 2016-08-28 MED ORDER — FENTANYL CITRATE (PF) 100 MCG/2ML IJ SOLN
INTRAMUSCULAR | Status: DC | PRN
  Administered 2016-08-28 (×3): 50 ug via INTRAVENOUS
  Administered 2016-08-28 (×2): 25 ug via INTRAVENOUS

## 2016-08-28 MED ORDER — SEVELAMER CARBONATE 800 MG PO TABS: 1600.0000 mg | ORAL_TABLET | Freq: Three times a day (TID) | ORAL | 0 refills | Status: DC

## 2016-08-28 MED ORDER — HYDROCODONE-ACETAMINOPHEN 5-325 MG PO TABS: 1.0000 | ORAL_TABLET | Freq: Four times a day (QID) | ORAL | 0 refills | Status: DC | PRN

## 2016-08-28 MED ORDER — ONDANSETRON HCL 4 MG/2ML IJ SOLN
INTRAMUSCULAR | Status: AC
  Filled 2016-08-28: qty 2

## 2016-08-28 MED ORDER — EPHEDRINE SULFATE 50 MG/ML IJ SOLN
INTRAMUSCULAR | Status: DC | PRN
  Administered 2016-08-28: 10 mg via INTRAVENOUS

## 2016-08-28 MED ORDER — PHENYLEPHRINE HCL 10 MG/ML IJ SOLN
INTRAVENOUS | Status: DC | PRN
  Administered 2016-08-28: 50 ug/min via INTRAVENOUS

## 2016-08-28 MED ORDER — HEPARIN SODIUM (PORCINE) 1000 UNIT/ML IJ SOLN
INTRAMUSCULAR | Status: AC
  Filled 2016-08-28: qty 1

## 2016-08-28 MED ORDER — MIDAZOLAM HCL 2 MG/2ML IJ SOLN
INTRAMUSCULAR | Status: AC
  Filled 2016-08-28: qty 2

## 2016-08-28 SURGICAL SUPPLY — 36 items
ARMBAND PINK RESTRICT EXTREMIT (MISCELLANEOUS) ×6 IMPLANT
CANISTER SUCT 3000ML PPV (MISCELLANEOUS) ×3 IMPLANT
CANNULA VESSEL 3MM 2 BLNT TIP (CANNULA) ×3 IMPLANT
CATH EMB 3FR 40CM (CATHETERS) ×3 IMPLANT
CLIP TI MEDIUM 6 (CLIP) ×3 IMPLANT
CLIP TI WIDE RED SMALL 6 (CLIP) ×3 IMPLANT
COVER PROBE W GEL 5X96 (DRAPES) IMPLANT
DECANTER SPIKE VIAL GLASS SM (MISCELLANEOUS) ×3 IMPLANT
DERMABOND ADVANCED (GAUZE/BANDAGES/DRESSINGS) ×2
DERMABOND ADVANCED .7 DNX12 (GAUZE/BANDAGES/DRESSINGS) ×1 IMPLANT
ELECT REM PT RETURN 9FT ADLT (ELECTROSURGICAL) ×3
ELECTRODE REM PT RTRN 9FT ADLT (ELECTROSURGICAL) ×1 IMPLANT
GLOVE BIO SURGEON STRL SZ 6.5 (GLOVE) ×4 IMPLANT
GLOVE BIO SURGEON STRL SZ7.5 (GLOVE) ×3 IMPLANT
GLOVE BIO SURGEONS STRL SZ 6.5 (GLOVE) ×2
GLOVE BIOGEL PI IND STRL 6.5 (GLOVE) ×2 IMPLANT
GLOVE BIOGEL PI IND STRL 8 (GLOVE) ×2 IMPLANT
GLOVE BIOGEL PI INDICATOR 6.5 (GLOVE) ×4
GLOVE BIOGEL PI INDICATOR 8 (GLOVE) ×4
GLOVE SURG SS PI 6.5 STRL IVOR (GLOVE) ×3 IMPLANT
GLOVE SURG SS PI 8.0 STRL IVOR (GLOVE) ×3 IMPLANT
GOWN STRL REUS W/ TWL LRG LVL3 (GOWN DISPOSABLE) ×3 IMPLANT
GOWN STRL REUS W/TWL LRG LVL3 (GOWN DISPOSABLE) ×6
KIT BASIN OR (CUSTOM PROCEDURE TRAY) ×3 IMPLANT
KIT ROOM TURNOVER OR (KITS) ×3 IMPLANT
NS IRRIG 1000ML POUR BTL (IV SOLUTION) ×3 IMPLANT
PACK CV ACCESS (CUSTOM PROCEDURE TRAY) ×3 IMPLANT
PAD ARMBOARD 7.5X6 YLW CONV (MISCELLANEOUS) ×6 IMPLANT
SPONGE SURGIFOAM ABS GEL 100 (HEMOSTASIS) IMPLANT
SUT PROLENE 6 0 BV (SUTURE) ×3 IMPLANT
SUT VIC AB 3-0 SH 27 (SUTURE) ×2
SUT VIC AB 3-0 SH 27X BRD (SUTURE) ×1 IMPLANT
SUT VICRYL 4-0 PS2 18IN ABS (SUTURE) ×3 IMPLANT
SYR TB 1ML LUER SLIP (SYRINGE) ×3 IMPLANT
UNDERPAD 30X30 (UNDERPADS AND DIAPERS) ×3 IMPLANT
WATER STERILE IRR 1000ML POUR (IV SOLUTION) ×3 IMPLANT

## 2016-08-28 NOTE — Progress Notes (Signed)
Off unit for av graft

## 2016-08-28 NOTE — Anesthesia Postprocedure Evaluation (Signed)
Anesthesia Post Note  Patient: Ryan [name]  Procedure(s) Performed: Procedure(s) (LRB): LEFT UPPER  ARM ARTERIOVENOUS (AV) FISTULA CREATION (Left)     Anesthesia Post Evaluation  Last Vitals:  Vitals:   08/28/16 1500 08/28/16 1515  BP: 106/76 119/73  Pulse: 97 91  Resp:    Temp:      Last Pain:  Vitals:   08/28/16 1345  TempSrc: Oral  PainSc: 0-No pain   Pain Goal:                 Danyiel Crespin JR,JOHN Kareemah Grounds

## 2016-08-28 NOTE — H&P (View-Only) (Signed)
Vein mapping reviewed.  Pt has adequate vein for left brachiocephalic AV fistula.  Discussed with pt and he is still unsure about proceeding.  I have discussed with him that we will move his IV from the left to the right arm to protect the vein.  Also discussed with him that we will leave him on the OR as scheduled and not to eat or drink anything after MN if he decides to proceed.    Ryan [name], [facility] 08/27/2016 2:19 PM

## 2016-08-28 NOTE — Progress Notes (Signed)
Patient returned from OR s/p left arm AV fistula. Patient is alert, no signs of distressed. Placed back on tele monitor, SR, heart rate 94 bpm. Vitals obtained. 98.5 temp. Respirations 16, hr-89 bpm, bp 101/64 spo2 95%

## 2016-08-28 NOTE — Progress Notes (Signed)
Accepted at [facility] Date and Time :Sat. At 6:20am Go to the clinic on Fri 6/8 to sign paperwork  Vickery 1st shift

## 2016-08-28 NOTE — Op Note (Signed)
    NAME: TREVAN MESSMAN    MRN: [id number] DOB: [birth year]    DATE OF OPERATION: 08/28/2016  PREOP DIAGNOSIS:    Stage V chronic kidney disease  POSTOP DIAGNOSIS:    Same  PROCEDURE:    Left brachiocephalic AV fistula  SURGEON: Judeth Cornfield. Scot Dock, MD, FACS  ASSIST: Leontine Locket, PA  ANESTHESIA: Gen.   EBL: Minimal  INDICATIONS:    Ryan [name] is a 56 y.o. male who presents for new access.  FINDINGS:    4 mm upper arm cephalic vein. There was some clot at the antecubital level which I easily removed.  TECHNIQUE:    The patient was taken to the operating room and we started with sedation but he later required conversion to a general anesthetic. By Doppler there was some clot at the antecubital level but I felt I could use the vein above this level. A transverse incision was made at the antecubital level after the left arm was prepped and draped in the usual sterile fashion. The dissection was carried onto the cephalic vein which was ligated distally and divided. There was some clot at the distal end which was easily removed. I used a 4 Fogarty catheter. There were some irregularities in the vein in pulling the catheter through the vein but the vein irrigated nicely with heparinized saline. The brachial artery was dissected free beneath the fascia. The patient was heparinized. The brachial artery was clamped proximally and distally and a longitudinal arteriotomy was made. The vein was sewn end-to-side to the artery using continuous 6-0 Prolene suture. At the completion there was a good thrill in the fistula and a palpable radial pulse. The heparin was partially reversed with protamine. The wound was closed with a deep layer of 3-0 Vicryl and the skin closed with 4-0 Vicryl. Dermabond was applied. The patient tolerated the procedure well and was transferred to the recovery room in stable condition. All needle and sponge counts were correct.  [name], MD,  FACS [facility]  DATE OF DICTATION:   08/28/2016

## 2016-08-28 NOTE — Interval H&P Note (Signed)
History and Physical Interval Note:  08/28/2016 9:16 AM  Ryan [name]  has presented today for surgery, with the diagnosis of End Stage Renal Disease  N18.6  The various methods of treatment have been discussed with the patient and family. After consideration of risks, benefits and other options for treatment, the patient has consented to  Procedure(s): LEFT ARM ARTERIOVENOUS (AV) FISTULA CREATION VERSUS INSERTION LEFT ARM ARTERIOVENOUS GRAFT (Left) as a surgical intervention .  The patient's history has been reviewed, patient examined, no change in status, stable for surgery.  I have reviewed the patient's chart and labs.  Questions were answered to the patient's satisfaction.     [name]

## 2016-08-28 NOTE — Anesthesia Procedure Notes (Signed)
Procedure Name: MAC Date/Time: 08/28/2016 9:33 AM Performed by: Candis Shine Pre-anesthesia Checklist: Patient identified, Emergency Drugs available, Suction available, Patient being monitored and Timeout performed Patient Re-evaluated:Patient Re-evaluated prior to inductionOxygen Delivery Method: Simple face mask Dental Injury: Teeth and Oropharynx as per pre-operative assessment

## 2016-08-28 NOTE — Anesthesia Preprocedure Evaluation (Signed)
Anesthesia Evaluation  Patient identified by MRN, date of birth, ID band Patient awake    Reviewed: Allergy & Precautions, NPO status , Patient's Chart, lab work & pertinent test results  Airway Mallampati: I       Dental no notable dental hx.    Pulmonary neg pulmonary ROS,    Pulmonary exam normal        Cardiovascular hypertension, Pt. on medications and Pt. on home beta blockers Normal cardiovascular exam Rhythm:Regular Rate:Normal     Neuro/Psych negative neurological ROS  negative psych ROS   GI/Hepatic negative GI ROS, Neg liver ROS,   Endo/Other  diabetes  Renal/GU CRFRenal disease     Musculoskeletal   Abdominal Normal abdominal exam  (+)   Peds  Hematology  (+) Blood dyscrasia, anemia ,   Anesthesia Other Findings   Reproductive/Obstetrics                             Anesthesia Physical Anesthesia Plan  ASA: III  Anesthesia Plan: MAC   Post-op Pain Management:    Induction: Intravenous  PONV Risk Score and Plan: 1 and Ondansetron and Treatment may vary due to age  Airway Management Planned:   Additional Equipment:   Intra-op Plan:   Post-operative Plan:   Informed Consent: I have reviewed the patients History and Physical, chart, labs and discussed the procedure including the risks, benefits and alternatives for the proposed anesthesia with the patient or authorized representative who has indicated his/her understanding and acceptance.     Plan Discussed with: CRNA and Surgeon  Anesthesia Plan Comments:         Anesthesia Quick Evaluation

## 2016-08-28 NOTE — Procedures (Signed)
I have personally attended this patient's dialysis session.   Keep even TDC Has new L BC AVF 4K bath Anticipate d/c after HD  Jamal Maes, MD [facility] [phone] Pager 08/28/2016, 2:01 PM

## 2016-08-28 NOTE — Progress Notes (Signed)
Off unit for hemodialysis

## 2016-08-28 NOTE — Interval H&P Note (Signed)
History and Physical Interval Note:  08/28/2016 9:17 AM  Ryan [name]  has presented today for surgery, with the diagnosis of End Stage Renal Disease  N18.6  The various methods of treatment have been discussed with the patient and family. After consideration of risks, benefits and other options for treatment, the patient has consented to  Procedure(s): LEFT ARM ARTERIOVENOUS (AV) FISTULA CREATION VERSUS INSERTION LEFT ARM ARTERIOVENOUS GRAFT (Left) as a surgical intervention .  The patient's history has been reviewed, patient examined, no change in status, stable for surgery.  I have reviewed the patient's chart and labs.  Questions were answered to the patient's satisfaction.     [name]

## 2016-08-28 NOTE — Progress Notes (Signed)
Back from HD. A/ox 4, no complaints

## 2016-08-28 NOTE — Progress Notes (Signed)
[facility] Rounding Note  Subjective/Interval History:  Had L BC AVF today No steal For HD today Has outpt HD at [facility] 1st shift Has to go tomorrow to sign papers then can start on Saturday  Objective Vital signs in last 24 hours: Vitals:   08/28/16 1100 08/28/16 1115 08/28/16 1130 08/28/16 1200  BP: 113/75 121/77 109/84 101/64  Pulse: 96 (!) 101 95 95  Resp: 11 15 14 16   Temp:   98.5 F (36.9 C) 98.5 F (36.9 C)  TempSrc:    Oral  SpO2: 99% 96% 98% 95%  Weight:      Height:       Weight change: -1.3 kg (-2 lb 13.9 oz)  Intake/Output Summary (Last 24 hours) at 08/28/16 1242 Last data filed at 08/28/16 1045  Gross per 24 hour  Intake              860 ml  Output                5 ml  Net              855 ml   Physical Exam:  Blood pressure 101/64, pulse 95, temperature 98.5 F (36.9 C), temperature source Oral, resp. rate 16, height 5\' 10"  (1.778 m), weight 75.5 kg (166 lb 6.4 oz), SpO2 95 %.  Soft spoken AAM NAD R sided TDC Skin warm and dry, no rash Lungs clear S1S2 No S3 Abd non distended and non tender R renal allograft non-tender No edema of LE's L BC AVF incision glued, excellent bruit and thrill (6/7)   Recent Labs Lab 08/25/16 1803 08/26/16 0135 08/27/16 0647 08/28/16 0504  NA 135 134* 134* 136  K 5.6* 5.8* 3.6 3.5  CL 110 109 101 97*  CO2 8* 8* 14* 25  GLUCOSE 115* 108* 128* 122*  BUN 158* 162* 107* 65*  CREATININE 16.54* 16.18* 11.50* 8.59*  CALCIUM 9.0 9.1 7.9* 8.4*  PHOS  --   --  8.2*  --     Recent Labs Lab 08/25/16 1803 08/27/16 0647  AST 12*  --   ALT 15*  --   ALKPHOS 67  --   BILITOT 0.3  --   PROT 7.3  --   ALBUMIN 3.7 3.0*    Recent Labs Lab 08/25/16 1803  LIPASE 35    Recent Labs Lab 08/25/16 1803 08/26/16 0135 08/28/16 0504  WBC 13.8* 15.0* 11.0*  HGB 12.6* 13.2 11.3*  HCT 36.8* 38.9* 34.2*  MCV 72.2* 72.4* 72.0*  PLT 170 137* 175   Results for THOMES, BURAK (MRN [id number]) as of 08/27/2016  12:14  Ref. Range 08/26/2016 12:30  PTH Latest Ref Range: 15 - 65 pg/mL 187 (H)   Studies/Results: No results found. Medications:  . aspirin  325 mg Oral Daily  . cloNIDine  0.1 mg Oral BID  . feeding supplement (NEPRO CARB STEADY)  237 mL Oral TID BM  . heparin  5,000 Units Subcutaneous Q8H  . labetalol  200 mg Oral TID  . mycophenolate  360 mg Oral BID  . NIFEdipine  60 mg Oral BID  . pravastatin  80 mg Oral q1800  . predniSONE  10 mg Oral Q breakfast  . sevelamer carbonate  1,600 mg Oral TID WC  . tacrolimus  5 mg Oral BID     Background: 56 yo AAM. PMH ESRD, HTN. S/p LURD renal xplant 2010.  Well know to me because of longstanding medical noncompliance, failure  to keep clinic appts. Most recent F/U has been at [facility]. 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 Did not get full recovery, Dialysis catheter was placed at some point  and was receiving HD via [facility] (notes from 06/2016 indicating TIW dialysis was recommended). Was advised to make appt with me - never did.  Last labs from [facility] 07/01/16 BUN 122 Creatinine 7.6. Last HD PTA was 07/04/16. Presented to ED with N/V/constipation, BUN 162, creatinine 16. We were called to provide HD and arrange locally.  Assessment/Recommendations  1. 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 Was recommended by [facility] docs to continue TIW. Last HD was 07/04/16 at [facility]. (Pt was not totally forthcoming with these details). Has HAD ESRD.  1. Resumed HD 6/5 and had 6/6 and will HD again today.  2. Has outpt spot at [facility] 1st shift.  3. Will have to go sign papers there on Friday then can start on Saturday there 4. OK with me for d/c after HD 2. Failed renal transplant - unclear exactly WHAT meds he had been taking PTA. Last seen at [facility] and they wanted to keep on TAC/MMF to prevent as much ongoing Ab formation as possible. (I personally don't think he would be an appropriate candidate for  re-transplantation because of his significant h/o poor compliance even when meds paid for - will need a much better track record).  1. Keep on current doses of TAC, MMF, pred for discharge 3. HTN - Meds. Looks euvolemic.  1. Post HD weight today = EDW 4. Hyperkalemia - Corrected with HD. 5. 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  1. Phos 8.2 binders resumed (Renvela 2 ac). 2. No VDRA needed at this time 7. Anemia  1. No ESA requirement at this time.  2. Will assess Fe status at outpt HD    Jamal Maes, MD [facility] [phone] pager 08/28/2016, 12:42 PM

## 2016-08-28 NOTE — H&P (View-Only) (Signed)
   VASCULAR SURGERY ASSESSMENT & PLAN:   We were consult and for hemodialysis access. The patient has a functioning right IJ tunneled dialysis catheter. The patient thinks that he would prefer to have peritoneal dialysis. He is tentatively on the schedule tomorrow for placement of a left arm AV fistula or graft however, if the patient does not wish to proceed then certainly we can cancel this. I am in the office today and I will have one of the physician's assistants talk with him later to see what his decision is.  His vein map is pending. He is right-handed. Unfortunately, an IV was started in the left arm after I saw him yesterday. Pending the results of his vein map, and if he is willing to proceed with surgery, we may have to move his IV to the other arm.   SUBJECTIVE:   Feels better this morning. He had significant nausea yesterday.  PHYSICAL EXAM:   Vitals:   08/26/16 1536 08/26/16 1700 08/26/16 2047 08/27/16 0423  BP: 125/85 112/71 (!) 99/57 107/76  Pulse: (!) 101 96 97 93  Resp:  19 19   Temp:  98.5 F (36.9 C) 98.3 F (36.8 C) 98.5 F (36.9 C)  TempSrc:  Oral Oral Oral  SpO2:  97% 98% 95%  Weight: 167 lb 8.8 oz (76 kg)  167 lb 8.8 oz (76 kg)   Height: 5\' 10"  (1.778 m)      Palpable radial pulses.  LABS:   Lab Results  Component Value Date   WBC 15.0 (H) 08/26/2016   HGB 13.2 08/26/2016   HCT 38.9 (L) 08/26/2016   MCV 72.4 (L) 08/26/2016   PLT 137 (L) 08/26/2016   Lab Results  Component Value Date   CREATININE 16.18 (H) 08/26/2016   No results found for: INR, PROTIME CBG (last 3)  No results for input(s): GLUCAP in the last 72 hours.  PROBLEM LIST:    Principal Problem:   Uremia Active Problems:   Metabolic acidosis   Nausea and vomiting   Renal transplant, status post   Essential hypertension   ESRD (end stage renal disease) (HCC)   CURRENT MEDS:   . aspirin  325 mg Oral Daily  . cloNIDine  0.1 mg Oral BID  . feeding supplement (NEPRO CARB  STEADY)  237 mL Oral TID BM  . heparin  5,000 Units Subcutaneous Q8H  . labetalol  200 mg Oral TID  . mycophenolate  360 mg Oral BID  . NIFEdipine  60 mg Oral BID  . pravastatin  80 mg Oral q1800  . predniSONE  10 mg Oral Q breakfast  . tacrolimus  5 mg Oral BID    Gae Gallop Beeper: [phone] Office: [phone] 08/27/2016

## 2016-08-28 NOTE — Care Management Note (Signed)
Case Management Note  Patient Details  Name: Ryan [name] MRN: [id number] Date of Birth: 04-12-1960  Subjective/Objective:    CM following for progression and d/c planning..                Action/Plan: Noted plan for pt to begin HD on Saturday , June 9th at 6:20am and pt will need to do his paperwork on Friday, June 8th. Ongoing HD at [facility] on [place] and Saturday.  No HH or DME needs identified.   Expected Discharge Date:    08/28/2016              Expected Discharge Plan:  Home/Self Care  In-House Referral:  Clinical Social Work  Discharge planning Services  CM Consult  Post Acute Care Choice:  NA Choice offered to:  NA  DME Arranged:  N/A DME Agency:  NA  HH Arranged:  NA HH Agency:  NA  Status of Service:  Completed, signed off  If discussed at [employer] of Stay Meetings, dates discussed:    Additional Comments:  Adron Bene, RN 08/28/2016, 12:46 PM

## 2016-08-28 NOTE — Anesthesia Procedure Notes (Signed)
Procedure Name: LMA Insertion Date/Time: 08/28/2016 9:55 AM Performed by: Candis Shine Pre-anesthesia Checklist: Patient identified, Emergency Drugs available, Suction available and Patient being monitored Patient Re-evaluated:Patient Re-evaluated prior to inductionOxygen Delivery Method: Circle System Utilized Preoxygenation: Pre-oxygenation with 100% oxygen Intubation Type: IV induction Ventilation: Mask ventilation without difficulty LMA: LMA inserted LMA Size: 5.0 Number of attempts: 1 Airway Equipment and Method: Bite block Placement Confirmation: positive ETCO2 Tube secured with: Tape Dental Injury: Teeth and Oropharynx as per pre-operative assessment

## 2016-08-28 NOTE — Transfer of Care (Signed)
Immediate Anesthesia Transfer of Care Note  Patient: Ryan [name]  Procedure(s) Performed: Procedure(s): LEFT UPPER  ARM ARTERIOVENOUS (AV) FISTULA CREATION (Left)  Patient Location: PACU  Anesthesia Type:General  Level of Consciousness: awake, alert  and oriented  Airway & Oxygen Therapy: Patient Spontanous Breathing and Patient connected to nasal cannula oxygen  Post-op Assessment: Report given to RN and Post -op Vital signs reviewed and stable  Post vital signs: Reviewed and stable  Last Vitals:  Vitals:   08/28/16 0556 08/28/16 0821  BP: 100/69   Pulse: 100 (!) 58  Resp: 17   Temp: 37.1 C     Last Pain:  Vitals:   08/28/16 0821  TempSrc:   PainSc: 0-No pain         Complications: No apparent anesthesia complications

## 2016-08-28 NOTE — Discharge Instructions (Signed)
°  Hemodialysis at [facility] , [address] [place], [place] on Tuesday, Thursday and Saturday. First treatment on Saturday, August 30, 2016 @ 6:20am. Go on Friday, August 29, 2016 to complete your paperwork.    08/28/2016 Ryan [name] [id number] November 05, 1960  Surgeon(s): Angelia Mould, MD  Procedure(s): LEFT UPPER  ARM ARTERIOVENOUS (AV) FISTULA CREATION  x Do not stick fistula for 12 weeks

## 2016-08-29 ENCOUNTER — Encounter (HOSPITAL_COMMUNITY): Payer: Self-pay | Admitting: Vascular Surgery

## 2016-08-29 ENCOUNTER — Telehealth: Payer: Self-pay | Admitting: Vascular Surgery

## 2016-08-29 NOTE — Telephone Encounter (Signed)
Sched lab 09/30/16 at 4:00 and MD 10/01/16 at 9:30. Spoke to pt to confirm appts.

## 2016-08-29 NOTE — Telephone Encounter (Signed)
-----   Message from Mena Goes, RN sent at 08/28/2016 12:03 PM EDT ----- Regarding: 4-6 weeks w/ duplex   ----- Message ----- From: Gabriel Earing, PA-C Sent: 08/28/2016  10:38 AM To: Vvs Charge Pool  S/p left BC AVF 08/28/16.  F/u with Dr. Scot Dock in 4-6 weeks with duplex.  Thanks, Aldona Bar

## 2016-09-01 NOTE — Discharge Summary (Signed)
[facility] Discharge Summary   Patient: Ryan [name] UVO:[id number]   PCP: Patient, No Pcp Per DOB: 05-04-1960   Date of admission: 08/25/2016   Date of discharge: 08/28/2016     Discharge Diagnoses:  Principal Problem:   Uremia Active Problems:   Metabolic acidosis   Nausea and vomiting   Renal transplant, status post   Essential hypertension   ESRD (end stage renal disease) ([place])   Admitted From: Home Disposition:  Home with family  Recommendations for Outpatient Follow-up:  1. Follow-up with vascular surgery in 6 weeks, follow up at hemodialysis center tomorrow to establish care.  Follow-up Information    Angelia Mould, MD Follow up in 6 week(s).   Specialties:  Vascular Surgery, Cardiology Why:  Office will call you to arrange your appt (sent) Contact information: [address] [place] [address] [phone]          Diet recommendation: Renal diet  Activity: The patient is advised to gradually reintroduce usual activities.  Discharge Condition: good  Code Status: Full code  History of present illness: As per the H and P dictated on admission, "Grey Rakestraw Maclaughlin is a 56 y.o. 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 Symptoms were initially intermittent, but had more persistent the last few days. Patient states that he had previously lost some of its benefits back in June 2017 for which he had had no way to pay for his antirejection medications. Subsequently, in November 2017 he was found to be in acute rejection and was admitted to [facility] for further treatment. During his hospitalization he underwent 12 days of plasmapheresis with IVIG, and subsequent hemodialysis. He had been on hemodialysis up until February/March of this year when he reports he was  told that he no longer needed the dialysis as he is making good urine and creatinine levels had trended down to as low as 4.86. Denies any leg swelling, shortness of breath, change in weight, fever, chills, nausea, vomiting  Follow-up by Dr. Ivette Loyal of nephrology at [facility] Course:  Summary of his active problems in the hospital is as following. 1. ESRD Renal transplant 2010, acute graft rejection 2017. Since his acute rejection in 2017 the patient has been on HD when necessary. Although her documentation patient was recommended to continue HD 3 times a week. Nephrology consulted and appreciated input. Started on hemodialysis via the catheter which is still functioning. Vascular surgery consulted for AV fistula placement. Tolerated procedure well Not a candidate for peritoneal dialysis due to lack of compliance as well as poor housing situation. Continue Prograf and prednisone. We will need to wean down this slowly. Clipped for hemodialysis prior to discharge.  2. Essential hypertension. Blood pressure currently well controlled. Continuing home regimen. May need to titrate medication depending on hemodialysis compliance.  3. Severe hyperkalemia. Potassium significantly elevated on admission due to lack of renal clearance. Currently potassium significantly better with hemodialysis. Daily monitor.  4. Leucocytosis.  Likely hemoconcentration. No evidence of active infection. No indication for antibiotics at present.  All other chronic medical condition were stable during the hospitalization.  Patient was ambulatory without any assistance. On the day of the discharge the patient's vitals were stable, and no other acute medical condition were reported by patient. the patient was felt safe to be discharge at home with family.  Procedures and Results:  HD   Left AV fistula creation  Consultations:  Hemodialysis  Vascular surgery  DISCHARGE  MEDICATION: Discharge Medication List as of 08/28/2016  6:36 PM    START taking these medications   Details  HYDROcodone-acetaminophen (NORCO) 5-325 MG tablet Take 1 tablet by mouth every 6 (six) hours as needed for moderate pain., Starting Thu 08/28/2016, Print    Nutritional Supplements (FEEDING SUPPLEMENT, NEPRO CARB STEADY,) LIQD Take 237 mLs by mouth 3 (three) times daily between meals., Starting Thu 08/28/2016, Normal    sevelamer carbonate (RENVELA) 800 MG tablet Take 2 tablets (1,600 mg total) by mouth 3 (three) times daily with meals., Starting Thu 08/28/2016, Normal      CONTINUE these medications which have CHANGED   Details  cloNIDine (CATAPRES) 0.1 MG tablet Take 1 tablet (0.1 mg total) by mouth 2 (two) times daily., Starting Thu 08/28/2016, Normal      CONTINUE these medications which have NOT CHANGED   Details  aspirin 325 MG tablet Take 325 mg by mouth every morning., Historical Med    heparin 5000 UNIT/ML injection Inject 1 mL (5,000 Units total) into the skin every 8 (eight) hours., Starting Tue 03/04/2016, No Print    labetalol (NORMODYNE) 200 MG tablet Take 200 mg by mouth 3 (three) times daily., Historical Med    multivitamin (RENA-VIT) TABS tablet Take 1 tablet by mouth daily., Historical Med    mycophenolate (MYFORTIC) 360 MG TBEC EC tablet Take 360 mg by mouth 2 (two) times daily., Historical Med    NIFEdipine (PROCARDIA XL/ADALAT-CC) 60 MG 24 hr tablet Take 60 mg by mouth 2 (two) times daily., Historical Med    pravastatin (PRAVACHOL) 80 MG tablet Take 80 mg by mouth every morning., Starting Thu 08/16/2015, Historical Med    prednisoLONE 5 MG TABS tablet Take 5 mg by mouth 2 (two) times daily., Historical Med    !! PROGRAF 1 MG capsule Take 3 mg by mouth 2 (two) times daily. , Starting Thu 07/26/2015, Historical Med    !! tacrolimus (PROGRAF) 5 MG capsule Take 5 mg by mouth 2 (two) times daily., Historical Med     !! - Potential duplicate medications found. Please  discuss with provider.    STOP taking these medications     enalapril (VASOTEC) 20 MG tablet      furosemide (LASIX) 80 MG tablet      methylPREDNISolone sodium succinate 500 mg in sodium chloride 0.9 % 50 mL      sodium bicarbonate 650 MG tablet        Allergies  Allergen Reactions  . No Known Allergies    Discharge Instructions    Diet renal [id number]    Complete by:  As directed    Discharge instructions    Complete by:  As directed    It is important that you read following instructions as well as go over your medication list with RN to help you understand your care after this hospitalization.  Discharge Instructions: Please follow-up with PCP in one week  Please request your primary care physician to go over all Hospital Tests and Procedure/Radiological results at the follow up,  Please get all Hospital records sent to your PCP by signing hospital release before you go home.   Do not drive, operating heavy machinery, perform activities at heights, swimming or participation in water activities or provide baby sitting services while you are on Pain, Sleep and Anxiety Medications; until you have been seen by Primary  Care Physician or a Neurologist and advised to do so again. Do not take more than prescribed Pain, Sleep and Anxiety Medications. You were cared for by a hospitalist during your hospital stay. If you have any questions about your discharge medications or the care you received while you were in the hospital after you are discharged, you can call the unit and ask to speak with the hospitalist on call if the hospitalist that took care of you is not available.  Once you are discharged, your primary care physician will handle any further medical issues. Please note that NO REFILLS for any discharge medications will be authorized once you are discharged, as it is imperative that you return to your primary care physician (or establish a relationship with a primary care  physician if you do not have one) for your aftercare needs so that they can reassess your need for medications and monitor your lab values. You Must read complete instructions/literature along with all the possible adverse reactions/side effects for all the Medicines you take and that have been prescribed to you. Take any new Medicines after you have completely understood and accept all the possible adverse reactions/side effects. Wear Seat belts while driving. If you have smoked or chewed Tobacco in the last 2 yrs please stop smoking and/or stop any Recreational drug use.   Increase activity slowly    Complete by:  As directed      Discharge Exam: Filed Weights   08/27/16 2010 08/28/16 1345 08/28/16 1722  Weight: 75.5 kg (166 lb 6.4 oz) 76.5 kg (168 lb 10.4 oz) 76.4 kg (168 lb 6.9 oz)   Vitals:   08/28/16 1700 08/28/16 1722  BP: 135/77 126/89  Pulse: 91 90  Resp:  15  Temp:  98.4 F (36.9 C)   General: Appear in no distress, no Rash; Oral Mucosa moist. Cardiovascular: S1 and S2 Present, no Murmur, no JVD Respiratory: Bilateral Air entry present and Clear to Auscultation, no Crackles, no wheezes Abdomen: Bowel Sound present, Soft and no tenderness Extremities: no Pedal edema, no calf tenderness Neurology: Grossly no focal neuro deficit.  The results of significant diagnostics from this hospitalization (including imaging, microbiology, ancillary and laboratory) are listed below for reference.    Significant Diagnostic Studies: Dg Abd Acute W/chest  Result Date: 08/26/2016 CLINICAL DATA:  Nausea and vomiting.  Chills. EXAM: DG ABDOMEN ACUTE W/ 1V CHEST COMPARISON:  None. FINDINGS: Right-sided catheter tip at the atrial caval junction. The heart is normal in size. Normal mediastinal contours. No consolidation, pulmonary edema or pleural fluid. No dilated bowel loops to suggest obstruction. There is increased air within small bowel in the central abdomen. Small volume of colonic stool. No  evidence of free air. Presumed ureteral stent within the right lower quadrant transplant kidney. No acute osseous abnormalities. IMPRESSION: 1. No evidence of bowel obstruction or free air. 2. Mild increased air within normal caliber small bowel, may be normal for this patient or seen with enteritis or mild ileus. 3. Presumed ureteral stent within a right lower quadrant transplant kidney. Electronically Signed   By: Jeb Levering M.D.   On: 08/26/2016 01:53    Microbiology: Recent Results (from the past 240 hour(s))  MRSA PCR Screening     Status: None   Collection Time: 08/26/16  2:33 AM  Result Value Ref Range Status   MRSA by PCR NEGATIVE NEGATIVE Final    Comment:        The GeneXpert MRSA Assay (FDA approved for NASAL  specimens only), is one component of a comprehensive MRSA colonization surveillance program. It is not intended to diagnose MRSA infection nor to guide or monitor treatment for MRSA infections.   Urine culture     Status: Abnormal   Collection Time: 08/26/16  7:26 PM  Result Value Ref Range Status   Specimen Description URINE, RANDOM  Final   Special Requests NONE  Final   Culture <10,000 COLONIES/mL INSIGNIFICANT GROWTH (A)  Final   Report Status 08/28/2016 FINAL  Final  Surgical pcr screen     Status: Abnormal   Collection Time: 08/28/16  4:31 AM  Result Value Ref Range Status   MRSA, PCR NEGATIVE NEGATIVE Final   Staphylococcus aureus POSITIVE (A) NEGATIVE Final    Comment:        The Xpert SA Assay (FDA approved for NASAL specimens in patients over 52 years of age), is one component of a comprehensive surveillance program.  Test performance has been validated by [facility] for patients greater than or equal to 34 year old. It is not intended to diagnose infection nor to guide or monitor treatment.      Labs: CBC:  Recent Labs Lab 08/25/16 1803 08/26/16 0135 08/28/16 0504  WBC 13.8* 15.0* 11.0*  HGB 12.6* 13.2 11.3*  HCT 36.8* 38.9*  34.2*  MCV 72.2* 72.4* 72.0*  PLT 170 137* 748   Basic Metabolic Panel:  Recent Labs Lab 08/25/16 1803 08/26/16 0135 08/27/16 0647 08/28/16 0504  NA 135 134* 134* 136  K 5.6* 5.8* 3.6 3.5  CL 110 109 101 97*  CO2 8* 8* 14* 25  GLUCOSE 115* 108* 128* 122*  BUN 158* 162* 107* 65*  CREATININE 16.54* 16.18* 11.50* 8.59*  CALCIUM 9.0 9.1 7.9* 8.4*  PHOS  --   --  8.2*  --    Liver Function Tests:  Recent Labs Lab 08/25/16 1803 08/27/16 0647  AST 12*  --   ALT 15*  --   ALKPHOS 67  --   BILITOT 0.3  --   PROT 7.3  --   ALBUMIN 3.7 3.0*    Recent Labs Lab 08/25/16 1803  LIPASE 35   No results for input(s): AMMONIA in the last 168 hours. Cardiac Enzymes: No results for input(s): CKTOTAL, CKMB, CKMBINDEX, TROPONINI in the last 168 hours. BNP (last 3 results) No results for input(s): BNP in the last 8760 hours. CBG:  Recent Labs Lab 08/28/16 1106  GLUCAP 102*   Time spent: 35 minutes  Signed:  Mamta Rimmer  [facility] 08/28/2016   , 7:57 AM

## 2016-09-02 DIAGNOSIS — N186 End stage renal disease: Secondary | ICD-10-CM | POA: Diagnosis not present

## 2016-09-02 DIAGNOSIS — D508 Other iron deficiency anemias: Secondary | ICD-10-CM | POA: Diagnosis not present

## 2016-09-02 DIAGNOSIS — N2581 Secondary hyperparathyroidism of renal origin: Secondary | ICD-10-CM | POA: Diagnosis not present

## 2016-09-02 DIAGNOSIS — E876 Hypokalemia: Secondary | ICD-10-CM | POA: Diagnosis not present

## 2016-09-04 DIAGNOSIS — D508 Other iron deficiency anemias: Secondary | ICD-10-CM | POA: Diagnosis not present

## 2016-09-04 DIAGNOSIS — N2581 Secondary hyperparathyroidism of renal origin: Secondary | ICD-10-CM | POA: Diagnosis not present

## 2016-09-04 DIAGNOSIS — E876 Hypokalemia: Secondary | ICD-10-CM | POA: Diagnosis not present

## 2016-09-04 DIAGNOSIS — N186 End stage renal disease: Secondary | ICD-10-CM | POA: Diagnosis not present

## 2016-09-06 DIAGNOSIS — N2581 Secondary hyperparathyroidism of renal origin: Secondary | ICD-10-CM | POA: Diagnosis not present

## 2016-09-06 DIAGNOSIS — E876 Hypokalemia: Secondary | ICD-10-CM | POA: Diagnosis not present

## 2016-09-06 DIAGNOSIS — N186 End stage renal disease: Secondary | ICD-10-CM | POA: Diagnosis not present

## 2016-09-06 DIAGNOSIS — D508 Other iron deficiency anemias: Secondary | ICD-10-CM | POA: Diagnosis not present

## 2016-09-09 DIAGNOSIS — E876 Hypokalemia: Secondary | ICD-10-CM | POA: Diagnosis not present

## 2016-09-09 DIAGNOSIS — D508 Other iron deficiency anemias: Secondary | ICD-10-CM | POA: Diagnosis not present

## 2016-09-09 DIAGNOSIS — N2581 Secondary hyperparathyroidism of renal origin: Secondary | ICD-10-CM | POA: Diagnosis not present

## 2016-09-09 DIAGNOSIS — N186 End stage renal disease: Secondary | ICD-10-CM | POA: Diagnosis not present

## 2016-09-13 DIAGNOSIS — N2581 Secondary hyperparathyroidism of renal origin: Secondary | ICD-10-CM | POA: Diagnosis not present

## 2016-09-13 DIAGNOSIS — D508 Other iron deficiency anemias: Secondary | ICD-10-CM | POA: Diagnosis not present

## 2016-09-13 DIAGNOSIS — E876 Hypokalemia: Secondary | ICD-10-CM | POA: Diagnosis not present

## 2016-09-13 DIAGNOSIS — N186 End stage renal disease: Secondary | ICD-10-CM | POA: Diagnosis not present

## 2016-09-16 DIAGNOSIS — D508 Other iron deficiency anemias: Secondary | ICD-10-CM | POA: Diagnosis not present

## 2016-09-16 DIAGNOSIS — N186 End stage renal disease: Secondary | ICD-10-CM | POA: Diagnosis not present

## 2016-09-16 DIAGNOSIS — N2581 Secondary hyperparathyroidism of renal origin: Secondary | ICD-10-CM | POA: Diagnosis not present

## 2016-09-16 DIAGNOSIS — E876 Hypokalemia: Secondary | ICD-10-CM | POA: Diagnosis not present

## 2016-09-18 DIAGNOSIS — E876 Hypokalemia: Secondary | ICD-10-CM | POA: Diagnosis not present

## 2016-09-18 DIAGNOSIS — N2581 Secondary hyperparathyroidism of renal origin: Secondary | ICD-10-CM | POA: Diagnosis not present

## 2016-09-18 DIAGNOSIS — N186 End stage renal disease: Secondary | ICD-10-CM | POA: Diagnosis not present

## 2016-09-18 DIAGNOSIS — D508 Other iron deficiency anemias: Secondary | ICD-10-CM | POA: Diagnosis not present

## 2016-09-20 DIAGNOSIS — D508 Other iron deficiency anemias: Secondary | ICD-10-CM | POA: Diagnosis not present

## 2016-09-20 DIAGNOSIS — Z992 Dependence on renal dialysis: Secondary | ICD-10-CM | POA: Diagnosis not present

## 2016-09-20 DIAGNOSIS — N186 End stage renal disease: Secondary | ICD-10-CM | POA: Diagnosis not present

## 2016-09-20 DIAGNOSIS — T8612 Kidney transplant failure: Secondary | ICD-10-CM | POA: Diagnosis not present

## 2016-09-20 DIAGNOSIS — E876 Hypokalemia: Secondary | ICD-10-CM | POA: Diagnosis not present

## 2016-09-20 DIAGNOSIS — N2581 Secondary hyperparathyroidism of renal origin: Secondary | ICD-10-CM | POA: Diagnosis not present

## 2016-09-22 ENCOUNTER — Emergency Department (HOSPITAL_COMMUNITY)
Admission: EM | Admit: 2016-09-22 | Discharge: 2016-09-22 | Disposition: A | Discharge status: Home / Self Care | Payer: Medicare Other | Attending: Emergency Medicine | Admitting: Emergency Medicine

## 2016-09-22 ENCOUNTER — Encounter: Payer: Self-pay | Admitting: Vascular Surgery

## 2016-09-22 ENCOUNTER — Encounter (HOSPITAL_COMMUNITY): Payer: Self-pay

## 2016-09-22 ENCOUNTER — Emergency Department (HOSPITAL_COMMUNITY): Payer: Medicare Other

## 2016-09-22 DIAGNOSIS — Z940 Kidney transplant status: Secondary | ICD-10-CM | POA: Insufficient documentation

## 2016-09-22 DIAGNOSIS — E119 Type 2 diabetes mellitus without complications: Secondary | ICD-10-CM | POA: Insufficient documentation

## 2016-09-22 DIAGNOSIS — R319 Hematuria, unspecified: Secondary | ICD-10-CM | POA: Insufficient documentation

## 2016-09-22 DIAGNOSIS — Z79899 Other long term (current) drug therapy: Secondary | ICD-10-CM | POA: Insufficient documentation

## 2016-09-22 DIAGNOSIS — N186 End stage renal disease: Secondary | ICD-10-CM | POA: Diagnosis not present

## 2016-09-22 DIAGNOSIS — N1330 Unspecified hydronephrosis: Secondary | ICD-10-CM | POA: Diagnosis not present

## 2016-09-22 DIAGNOSIS — R300 Dysuria: Secondary | ICD-10-CM | POA: Diagnosis present

## 2016-09-22 DIAGNOSIS — N3091 Cystitis, unspecified with hematuria: Secondary | ICD-10-CM | POA: Insufficient documentation

## 2016-09-22 DIAGNOSIS — I120 Hypertensive chronic kidney disease with stage 5 chronic kidney disease or end stage renal disease: Secondary | ICD-10-CM | POA: Insufficient documentation

## 2016-09-22 DIAGNOSIS — N3090 Cystitis, unspecified without hematuria: Secondary | ICD-10-CM

## 2016-09-22 LAB — URINALYSIS, ROUTINE W REFLEX MICROSCOPIC
Bilirubin Urine: NEGATIVE
GLUCOSE, UA: NEGATIVE mg/dL
Ketones, ur: NEGATIVE mg/dL
Leukocytes, UA: NEGATIVE
Nitrite: NEGATIVE
Protein, ur: NEGATIVE mg/dL
pH: 7 (ref 5.0–8.0)

## 2016-09-22 LAB — I-STAT CHEM 8, ED
BUN: 39 mg/dL — AB (ref 6–20)
CALCIUM ION: 1.15 mmol/L (ref 1.15–1.40)
CHLORIDE: 100 mmol/L — AB (ref 101–111)
CREATININE: 7.9 mg/dL — AB (ref 0.61–1.24)
GLUCOSE: 123 mg/dL — AB (ref 65–99)
HCT: 30 % — ABNORMAL LOW (ref 39.0–52.0)
Hemoglobin: 10.2 g/dL — ABNORMAL LOW (ref 13.0–17.0)
Potassium: 4.4 mmol/L (ref 3.5–5.1)
Sodium: 139 mmol/L (ref 135–145)
TCO2: 30 mmol/L (ref 0–100)

## 2016-09-22 LAB — URINALYSIS, MICROSCOPIC (REFLEX)
Bacteria, UA: NONE SEEN
WBC, UA: NONE SEEN WBC/hpf (ref 0–5)

## 2016-09-22 MED ORDER — CEPHALEXIN 500 MG PO CAPS: 500.0000 mg | ORAL_CAPSULE | Freq: Three times a day (TID) | ORAL | 0 refills | Status: DC

## 2016-09-22 NOTE — ED Notes (Signed)
ED Provider at bedside. 

## 2016-09-22 NOTE — ED Notes (Signed)
Patient transported to CT 

## 2016-09-22 NOTE — ED Notes (Signed)
Pt returned from CT °

## 2016-09-22 NOTE — ED Triage Notes (Addendum)
Pt reports burning with urination since Thursday. Today he began having blood in urine. HE endorses frequency and urgency. Denies flank pain, or abdominal pain, fevers, or chills. PT has hx of kidney transplant and recently started back on dialysis. PT reports taking hydrocodone pta

## 2016-09-22 NOTE — ED Provider Notes (Signed)
[facility] DEPT Provider Note   CSN: [id number] Arrival date & time: 09/22/16  1115     History   Chief Complaint Chief Complaint  Patient presents with  . Dysuria  . Hematuria    HPI Ryan [name] is a 56 y.o. male.  56 year old male presents with one-day history of hematuria. States that he noted urine as well as some pain in his scrotum. Denies any flank pain. No scrotal edema. Took hydrocodone with relief of his symptoms. Denies any prior history of kidney stones. He is status post renal transplant but that has failed and he is currently again dialysis. His last dialysis was 2 days ago and he is scheduled tomorrow. Denies any fever, vomiting, abdominal discomfort currently.      Past Medical History:  Diagnosis Date  . DM2 (diabetes mellitus, type 2) ([place])   . ESRD (end stage renal disease) ([place])   . HTN (hypertension)   . Kidney transplant as cause of abnormal reaction or later complication     Patient Active Problem List   Diagnosis Date Noted  . Nausea and vomiting 08/26/2016  . Renal transplant, status post 08/26/2016  . Essential hypertension 08/26/2016  . ESRD (end stage renal disease) ([place]) 08/26/2016  . EKG abnormalities   . Acute renal transplant rejection 03/03/2016  . Acute hyperkalemia 03/03/2016  . Acute kidney failure ([place]) 03/03/2016  . Uremia 03/03/2016  . Metabolic acidosis 63/89/3734    Past Surgical History:  Procedure Laterality Date  . AV FISTULA PLACEMENT Left 08/28/2016   Procedure: LEFT UPPER  ARM ARTERIOVENOUS (AV) FISTULA CREATION;  Surgeon: Angelia Mould, MD;  Location: [facility];  Service: Vascular;  Laterality: Left;  . KIDNEY TRANSPLANT         Home Medications    Prior to Admission medications   Medication Sig Start Date End Date Taking? Authorizing Provider  aspirin 325 MG tablet Take 325 mg by mouth every morning.    [provider]  cloNIDine (CATAPRES) 0.1 MG tablet Take 1 tablet (0.1 mg total)  by mouth 2 (two) times daily. 08/28/16   Lavina Hamman, MD  heparin 5000 UNIT/ML injection Inject 1 mL (5,000 Units total) into the skin every 8 (eight) hours. Patient not taking: Reported on 08/25/2016 03/04/16   Rama, Venetia Maxon, MD  HYDROcodone-acetaminophen (NORCO) 5-325 MG tablet Take 1 tablet by mouth every 6 (six) hours as needed for moderate pain. 08/28/16   Lavina Hamman, MD  labetalol (NORMODYNE) 200 MG tablet Take 200 mg by mouth 3 (three) times daily.    [provider]  multivitamin (RENA-VIT) TABS tablet Take 1 tablet by mouth daily.    [provider]  mycophenolate (MYFORTIC) 360 MG TBEC EC tablet Take 360 mg by mouth 2 (two) times daily.    [provider]  NIFEdipine (PROCARDIA XL/ADALAT-CC) 60 MG 24 hr tablet Take 60 mg by mouth 2 (two) times daily.    [provider]  Nutritional Supplements (FEEDING SUPPLEMENT, NEPRO CARB STEADY,) LIQD Take 237 mLs by mouth 3 (three) times daily between meals. 08/28/16   Lavina Hamman, MD  pravastatin (PRAVACHOL) 80 MG tablet Take 80 mg by mouth every morning. 08/16/15   [provider]  prednisoLONE 5 MG TABS tablet Take 5 mg by mouth 2 (two) times daily.    [provider]  PROGRAF 1 MG capsule Take 3 mg by mouth 2 (two) times daily.  07/26/15   [provider]  sevelamer carbonate (RENVELA)  800 MG tablet Take 2 tablets (1,600 mg total) by mouth 3 (three) times daily with meals. 08/28/16   Lavina Hamman, MD  tacrolimus (PROGRAF) 5 MG capsule Take 5 mg by mouth 2 (two) times daily.    [provider]    Family History Family History  Problem Relation Age of Onset  . Hypertension Mother   . Cancer Father     Social History Social History  Substance Use Topics  . Smoking status: Never Smoker  . Smokeless tobacco: Never Used  . Alcohol use No     Allergies   No known allergies   Review of Systems Review of Systems  All other systems reviewed and are  negative.    Physical Exam Updated Vital Signs BP 132/80 (BP Location: Right Arm)   Pulse 92   Temp 97.8 F (36.6 C) (Oral)   Resp 16   Ht 1.778 m (5\' 10" )   Wt 81.6 kg (180 lb)   SpO2 97%   BMI 25.83 kg/m   Physical Exam  Constitutional: He is oriented to person, place, and time. He appears well-developed and well-nourished.  Non-toxic appearance. No distress.  HENT:  Head: Normocephalic and atraumatic.  Eyes: Conjunctivae, EOM and lids are normal. Pupils are equal, round, and reactive to light.  Neck: Normal range of motion. Neck supple. No tracheal deviation present. No thyroid mass present.  Cardiovascular: Normal rate, regular rhythm and normal heart sounds.  Exam reveals no gallop.   No murmur heard. Pulmonary/Chest: Effort normal and breath sounds normal. No stridor. No respiratory distress. He has no decreased breath sounds. He has no wheezes. He has no rhonchi. He has no rales.  Abdominal: Soft. Normal appearance and bowel sounds are normal. He exhibits no distension. There is no tenderness. There is no rebound and no CVA tenderness.  Genitourinary: Testes normal and penis normal. Right testis shows no mass and no tenderness. Left testis shows no mass and no tenderness. Circumcised.  Musculoskeletal: Normal range of motion. He exhibits no edema or tenderness.  Neurological: He is alert and oriented to person, place, and time. He has normal strength. No cranial nerve deficit or sensory deficit. GCS eye subscore is 4. GCS verbal subscore is 5. GCS motor subscore is 6.  Skin: Skin is warm and dry. No abrasion and no rash noted.  Psychiatric: He has a normal mood and affect. His speech is normal and behavior is normal.  Nursing note and vitals reviewed.    ED Treatments / Results  Labs (all labs ordered are listed, but only abnormal results are displayed) Labs Reviewed  URINALYSIS, ROUTINE W REFLEX MICROSCOPIC - Abnormal; Notable for the following:       Result Value    Specific Gravity, Urine <1.005 (*)    Hgb urine dipstick TRACE (*)    All other components within normal limits  URINALYSIS, MICROSCOPIC (REFLEX) - Abnormal; Notable for the following:    Squamous Epithelial / LPF 0-5 (*)    All other components within normal limits  I-STAT CHEM 8, ED - Abnormal; Notable for the following:    Chloride 100 (*)    BUN 39 (*)    Creatinine, Ser 7.90 (*)    Glucose, Bld 123 (*)    Hemoglobin 10.2 (*)    HCT 30.0 (*)    All other components within normal limits    EKG  EKG Interpretation None       Radiology No results found.  Procedures Procedures (including  critical care time)  Medications Ordered in ED Medications - No data to display   Initial Impression / Assessment and Plan / ED Course  I have reviewed the triage vital signs and the nursing notes.  Pertinent labs & imaging results that were available during my care of the patient were reviewed by me and considered in my medical decision making (see chart for details).     Patient evidence of cystitis on his CT which is likely the cause of his hematuria. Will place on antibiotics and given referral to see a urologist.  Final Clinical Impressions(s) / ED Diagnoses   Final diagnoses:  None    New Prescriptions New Prescriptions   No medications on file     Lacretia Leigh, MD 09/22/16 1920

## 2016-09-25 DIAGNOSIS — E876 Hypokalemia: Secondary | ICD-10-CM | POA: Diagnosis not present

## 2016-09-25 DIAGNOSIS — D508 Other iron deficiency anemias: Secondary | ICD-10-CM | POA: Diagnosis not present

## 2016-09-25 DIAGNOSIS — N186 End stage renal disease: Secondary | ICD-10-CM | POA: Diagnosis not present

## 2016-09-25 DIAGNOSIS — N2581 Secondary hyperparathyroidism of renal origin: Secondary | ICD-10-CM | POA: Diagnosis not present

## 2016-09-25 DIAGNOSIS — D649 Anemia, unspecified: Secondary | ICD-10-CM | POA: Diagnosis not present

## 2016-09-27 DIAGNOSIS — Z4901 Encounter for fitting and adjustment of extracorporeal dialysis catheter: Secondary | ICD-10-CM | POA: Insufficient documentation

## 2016-09-29 ENCOUNTER — Other Ambulatory Visit: Payer: Self-pay

## 2016-09-29 DIAGNOSIS — N186 End stage renal disease: Secondary | ICD-10-CM

## 2016-09-30 ENCOUNTER — Encounter (HOSPITAL_COMMUNITY): Payer: Medicare Other

## 2016-10-01 ENCOUNTER — Encounter: Payer: Medicare Other | Admitting: Vascular Surgery

## 2016-10-02 ENCOUNTER — Ambulatory Visit (INDEPENDENT_AMBULATORY_CARE_PROVIDER_SITE_OTHER): Payer: Self-pay | Admitting: Vascular Surgery

## 2016-10-02 ENCOUNTER — Ambulatory Visit (HOSPITAL_COMMUNITY)
Admission: RE | Admit: 2016-10-02 | Discharge: 2016-10-02 | Disposition: A | Discharge status: Home / Self Care | Payer: Medicare Other | Source: Ambulatory Visit | Attending: Vascular Surgery | Admitting: Vascular Surgery

## 2016-10-02 ENCOUNTER — Encounter: Payer: Self-pay | Admitting: Vascular Surgery

## 2016-10-02 VITALS — BP 118/63 | HR 90 | Temp 98.2°F | Resp 18 | Ht 70.5 in | Wt 182.0 lb

## 2016-10-02 DIAGNOSIS — N2581 Secondary hyperparathyroidism of renal origin: Secondary | ICD-10-CM | POA: Diagnosis not present

## 2016-10-02 DIAGNOSIS — E876 Hypokalemia: Secondary | ICD-10-CM | POA: Diagnosis not present

## 2016-10-02 DIAGNOSIS — N184 Chronic kidney disease, stage 4 (severe): Secondary | ICD-10-CM

## 2016-10-02 DIAGNOSIS — D508 Other iron deficiency anemias: Secondary | ICD-10-CM | POA: Diagnosis not present

## 2016-10-02 DIAGNOSIS — N186 End stage renal disease: Secondary | ICD-10-CM

## 2016-10-02 DIAGNOSIS — D649 Anemia, unspecified: Secondary | ICD-10-CM | POA: Diagnosis not present

## 2016-10-02 NOTE — Progress Notes (Signed)
Patient name: Ryan [name] MRN: [id number] DOB: Oct 04, 1960 Sex: male  REASON FOR VISIT:    Follow up after left brachiocephalic AV fistula.  HPI:   Ryan [name] is a pleasant 56 y.o. male  who had placement of a left brachiocephalic AV fistula on 87/56/4332. He comes in for a 6 week follow up visit. He dialyzes on Tuesdays Thursdays and Saturdays using his tunneled dialysis catheter. He denies any symptoms in his left arm. Specifically he denies pain or paresthesias.  Current Outpatient Prescriptions  Medication Sig Dispense Refill  . aspirin 325 MG tablet Take 325 mg by mouth every morning.    . cephALEXin (KEFLEX) 500 MG capsule Take 1 capsule (500 mg total) by mouth 3 (three) times daily. 21 capsule 0  . cloNIDine (CATAPRES) 0.1 MG tablet Take 1 tablet (0.1 mg total) by mouth 2 (two) times daily. 60 tablet 0  . furosemide (LASIX) 80 MG tablet TAKE 1 TABLET (80 MG TOTAL) BY MOUTH 2 TIMES DAILY.  5  . HYDROcodone-acetaminophen (NORCO) 5-325 MG tablet Take 1 tablet by mouth every 6 (six) hours as needed for moderate pain. 15 tablet 0  . labetalol (NORMODYNE) 200 MG tablet Take 200 mg by mouth 3 (three) times daily.    . multivitamin (RENA-VIT) TABS tablet Take 1 tablet by mouth daily.    . mycophenolate (MYFORTIC) 360 MG TBEC EC tablet Take 360 mg by mouth 2 (two) times daily.    Marland Kitchen NIFEdipine (PROCARDIA XL/ADALAT-CC) 60 MG 24 hr tablet Take 60 mg by mouth 2 (two) times daily.    . Nutritional Supplements (FEEDING SUPPLEMENT, NEPRO CARB STEADY,) LIQD Take 237 mLs by mouth 3 (three) times daily between meals. 21 Can 0  . pravastatin (PRAVACHOL) 80 MG tablet Take 80 mg by mouth every morning.  2  . prednisoLONE 5 MG TABS tablet Take 5 mg by mouth 2 (two) times daily.    . predniSONE (DELTASONE) 5 MG tablet TAKE 1 TABLET BY MOUTH DAILY.    Marland Kitchen PROGRAF 1 MG capsule Take 3 mg by mouth 2 (two) times daily.   3  . sevelamer carbonate (RENVELA) 800 MG tablet Take 2 tablets (1,600 mg  total) by mouth 3 (three) times daily with meals. 30 tablet 0  . tacrolimus (PROGRAF) 5 MG capsule Take 5 mg by mouth 2 (two) times daily.    . heparin 5000 UNIT/ML injection Inject 1 mL (5,000 Units total) into the skin every 8 (eight) hours. (Patient not taking: Reported on 10/02/2016) 1 mL    No current facility-administered medications for this visit.     REVIEW OF SYSTEMS:  [X]  denotes positive finding, [ ]  denotes negative finding Cardiac  Comments:  Chest pain or chest pressure:    Shortness of breath upon exertion:    Short of breath when lying flat:    Irregular heart rhythm:    Constitutional    Fever or chills:     PHYSICAL EXAM:   Vitals:   10/02/16 1605  BP: 118/63  Pulse: 90  Resp: 18  Temp: 98.2 F (36.8 C)  SpO2: 98%  Weight: 182 lb (82.6 kg)  Height: 5' 10.5" (1.791 m)    GENERAL: The patient is a well-nourished male, in no acute distress. The vital signs are documented above. CARDIOVASCULAR: There is a regular rate and rhythm. PULMONARY: There is good air exchange bilaterally without wheezing or rales. He has a palpable left radial pulse. The fistula has a good thrill and is  not especially pulsatile.  DATA:   DUPLEX OF LEFT BRACHIOCEPHALIC AV FISTULA:  I have independently interpreted his duplex of his left brachiocephalic AV fistula. The diameters of the fistula range from 0.36-0.59 cm. There is an area of increased velocities within the midportion of the fistula.  MEDICAL ISSUES:   STATUS POST LEFT BRACHIOCEPHALIC AV FISTULA: His fistula appears to be maturing adequately although it is still not large enough for access. However is only been about 5 weeks. There is an area in the central portion of the fistula with some mildly elevated velocities however the fistula is not pulsatile so at this point I do not think that is significant. I have recommended a follow duplex scan in 6 weeks and I'll see him back at that time. Hopefully by then the vein will be  adequate size for cannulation. If not we could consider a fistulogram.  [name] [facility] [phone]

## 2016-10-04 DIAGNOSIS — D649 Anemia, unspecified: Secondary | ICD-10-CM | POA: Diagnosis not present

## 2016-10-04 DIAGNOSIS — N186 End stage renal disease: Secondary | ICD-10-CM | POA: Diagnosis not present

## 2016-10-04 DIAGNOSIS — E876 Hypokalemia: Secondary | ICD-10-CM | POA: Diagnosis not present

## 2016-10-04 DIAGNOSIS — D508 Other iron deficiency anemias: Secondary | ICD-10-CM | POA: Diagnosis not present

## 2016-10-04 DIAGNOSIS — N2581 Secondary hyperparathyroidism of renal origin: Secondary | ICD-10-CM | POA: Diagnosis not present

## 2016-10-06 NOTE — Addendum Note (Signed)
Addended by: Lianne Cure A on: 10/06/2016 10:33 AM   Modules accepted: Orders

## 2016-10-07 ENCOUNTER — Emergency Department (HOSPITAL_COMMUNITY)
Admission: EM | Admit: 2016-10-07 | Discharge: 2016-10-07 | Disposition: A | Discharge status: Home / Self Care | Payer: Medicare Other | Attending: Emergency Medicine | Admitting: Emergency Medicine

## 2016-10-07 ENCOUNTER — Encounter (HOSPITAL_COMMUNITY): Payer: Self-pay

## 2016-10-07 DIAGNOSIS — R310 Gross hematuria: Secondary | ICD-10-CM | POA: Diagnosis not present

## 2016-10-07 DIAGNOSIS — I120 Hypertensive chronic kidney disease with stage 5 chronic kidney disease or end stage renal disease: Secondary | ICD-10-CM | POA: Diagnosis not present

## 2016-10-07 DIAGNOSIS — Z940 Kidney transplant status: Secondary | ICD-10-CM | POA: Insufficient documentation

## 2016-10-07 DIAGNOSIS — N186 End stage renal disease: Secondary | ICD-10-CM | POA: Insufficient documentation

## 2016-10-07 DIAGNOSIS — R319 Hematuria, unspecified: Secondary | ICD-10-CM | POA: Diagnosis present

## 2016-10-07 DIAGNOSIS — Z992 Dependence on renal dialysis: Secondary | ICD-10-CM | POA: Diagnosis not present

## 2016-10-07 DIAGNOSIS — E1122 Type 2 diabetes mellitus with diabetic chronic kidney disease: Secondary | ICD-10-CM | POA: Diagnosis not present

## 2016-10-07 LAB — CBC WITH DIFFERENTIAL/PLATELET
BASOS ABS: 0 10*3/uL (ref 0.0–0.1)
BASOS PCT: 0 %
EOS ABS: 0.1 10*3/uL (ref 0.0–0.7)
EOS PCT: 1 %
HCT: 27.5 % — ABNORMAL LOW (ref 39.0–52.0)
HEMOGLOBIN: 8.2 g/dL — AB (ref 13.0–17.0)
LYMPHS ABS: 0.9 10*3/uL (ref 0.7–4.0)
Lymphocytes Relative: 8 %
MCH: 23.5 pg — ABNORMAL LOW (ref 26.0–34.0)
MCHC: 29.8 g/dL — ABNORMAL LOW (ref 30.0–36.0)
MCV: 78.8 fL (ref 78.0–100.0)
Monocytes Absolute: 0.3 10*3/uL (ref 0.1–1.0)
Monocytes Relative: 3 %
NEUTROS PCT: 88 %
Neutro Abs: 9.4 10*3/uL — ABNORMAL HIGH (ref 1.7–7.7)
PLATELETS: 221 10*3/uL (ref 150–400)
RBC: 3.49 MIL/uL — AB (ref 4.22–5.81)
RDW: 18.8 % — ABNORMAL HIGH (ref 11.5–15.5)
WBC: 10.7 10*3/uL — AB (ref 4.0–10.5)

## 2016-10-07 LAB — URINALYSIS, ROUTINE W REFLEX MICROSCOPIC
BACTERIA UA: NONE SEEN
BILIRUBIN URINE: NEGATIVE
Glucose, UA: 150 mg/dL — AB
Ketones, ur: NEGATIVE mg/dL
LEUKOCYTES UA: NEGATIVE
NITRITE: NEGATIVE
SPECIFIC GRAVITY, URINE: 1.02 (ref 1.005–1.030)
SQUAMOUS EPITHELIAL / LPF: NONE SEEN
pH: 8 (ref 5.0–8.0)

## 2016-10-07 LAB — BASIC METABOLIC PANEL
ANION GAP: 12 (ref 5–15)
BUN: 61 mg/dL — AB (ref 6–20)
CHLORIDE: 106 mmol/L (ref 101–111)
CO2: 20 mmol/L — ABNORMAL LOW (ref 22–32)
Calcium: 9.1 mg/dL (ref 8.9–10.3)
Creatinine, Ser: 9.25 mg/dL — ABNORMAL HIGH (ref 0.61–1.24)
GFR, EST AFRICAN AMERICAN: 7 mL/min — AB (ref >60)
GFR, EST NON AFRICAN AMERICAN: 6 mL/min — AB (ref >60)
Glucose, Bld: 119 mg/dL — ABNORMAL HIGH (ref 65–99)
POTASSIUM: 5.1 mmol/L (ref 3.5–5.1)
SODIUM: 138 mmol/L (ref 135–145)

## 2016-10-07 MED ORDER — OXYBUTYNIN CHLORIDE 5 MG PO TABS: 5.0000 mg | ORAL_TABLET | Freq: Three times a day (TID) | ORAL | 0 refills | Status: AC

## 2016-10-07 NOTE — ED Provider Notes (Signed)
[facility] DEPT Provider Note   CSN: [id number] Arrival date & time: 10/07/16  0848     History   Chief Complaint Chief Complaint  Patient presents with  . Hematuria    HPI Ryan [name] is a 56 y.o. male.  Pt presents with recurrence of hematuria, increased frequency and dysuria which first occurred around July 3rd where he was seen in the ED and given a course of Keflex.  Pt admits not taking the last few doses as he was feeling better but now has the same exact symptoms with hematuria first occurring this morning and dysuria and increased frequency beginning last night.  He denies flank pain, SOB, CP, fever, fatigue.  Pt had a kidney transplant 10 years ago. He reports being compliant with his anti rejection medications but admits due to financial reasons he did not take them from [place] of last year.  He currently receives dialysis Saturday, Tuesday, Thursday but has missed his dialysis today due to his recurrence of urinary symptoms.  Pt reports making the same amount of urine as usual and reports during dialysis they don't pull off fluid but only "clean his blood".        Past Medical History:  Diagnosis Date  . DM2 (diabetes mellitus, type 2) ([place])   . ESRD (end stage renal disease) ([place])   . HTN (hypertension)   . Kidney transplant as cause of abnormal reaction or later complication     Patient Active Problem List   Diagnosis Date Noted  . Nausea and vomiting 08/26/2016  . Renal transplant, status post 08/26/2016  . Essential hypertension 08/26/2016  . ESRD (end stage renal disease) ([place]) 08/26/2016  . EKG abnormalities   . Acute renal transplant rejection 03/03/2016  . Acute hyperkalemia 03/03/2016  . Acute kidney failure ([place]) 03/03/2016  . Uremia 03/03/2016  . Metabolic acidosis 94/70/9628    Past Surgical History:  Procedure Laterality Date  . AV FISTULA PLACEMENT Left 08/28/2016   Procedure: LEFT UPPER  ARM ARTERIOVENOUS (AV) FISTULA  CREATION;  Surgeon: Angelia Mould, MD;  Location: [facility];  Service: Vascular;  Laterality: Left;  . KIDNEY TRANSPLANT         Home Medications    Prior to Admission medications   Medication Sig Start Date End Date Taking? Authorizing Provider  aspirin 325 MG tablet Take 325 mg by mouth every morning.    [provider]  cephALEXin (KEFLEX) 500 MG capsule Take 1 capsule (500 mg total) by mouth 3 (three) times daily. 09/22/16   Lacretia Leigh, MD  cloNIDine (CATAPRES) 0.1 MG tablet Take 1 tablet (0.1 mg total) by mouth 2 (two) times daily. 08/28/16   Lavina Hamman, MD  furosemide (LASIX) 80 MG tablet TAKE 1 TABLET (80 MG TOTAL) BY MOUTH 2 TIMES DAILY. 08/28/16   [provider]  heparin 5000 UNIT/ML injection Inject 1 mL (5,000 Units total) into the skin every 8 (eight) hours. Patient not taking: Reported on 10/02/2016 03/04/16   Rama, Venetia Maxon, MD  HYDROcodone-acetaminophen (NORCO) 5-325 MG tablet Take 1 tablet by mouth every 6 (six) hours as needed for moderate pain. 08/28/16   Lavina Hamman, MD  labetalol (NORMODYNE) 200 MG tablet Take 200 mg by mouth 3 (three) times daily.    [provider]  multivitamin (RENA-VIT) TABS tablet Take 1 tablet by mouth daily.    [provider]  mycophenolate (MYFORTIC) 360 MG TBEC EC tablet Take 360 mg by mouth 2 (two) times daily.  [provider]  NIFEdipine (PROCARDIA XL/ADALAT-CC) 60 MG 24 hr tablet Take 60 mg by mouth 2 (two) times daily.    [provider]  Nutritional Supplements (FEEDING SUPPLEMENT, NEPRO CARB STEADY,) LIQD Take 237 mLs by mouth 3 (three) times daily between meals. 08/28/16   Lavina Hamman, MD  pravastatin (PRAVACHOL) 80 MG tablet Take 80 mg by mouth every morning. 08/16/15   [provider]  prednisoLONE 5 MG TABS tablet Take 5 mg by mouth 2 (two) times daily.    [provider]  predniSONE (DELTASONE) 5 MG tablet TAKE 1 TABLET BY MOUTH DAILY. 09/11/16    [provider]  PROGRAF 1 MG capsule Take 3 mg by mouth 2 (two) times daily.  07/26/15   [provider]  sevelamer carbonate (RENVELA) 800 MG tablet Take 2 tablets (1,600 mg total) by mouth 3 (three) times daily with meals. 08/28/16   Lavina Hamman, MD  tacrolimus (PROGRAF) 5 MG capsule Take 5 mg by mouth 2 (two) times daily.    [provider]    Family History Family History  Problem Relation Age of Onset  . Hypertension Mother   . Cancer Father     Social History Social History  Substance Use Topics  . Smoking status: Never Smoker  . Smokeless tobacco: Never Used  . Alcohol use No     Allergies   No known allergies   Review of Systems Review of Systems  Constitutional: Negative for activity change, chills, diaphoresis, fatigue and fever.  HENT: Negative for ear pain, hearing loss, postnasal drip and sore throat.   Eyes: Negative for pain and visual disturbance.  Respiratory: Negative for cough, shortness of breath and wheezing.   Cardiovascular: Negative for chest pain, palpitations and leg swelling.  Gastrointestinal: Negative for abdominal pain and vomiting.  Genitourinary: Positive for dysuria, frequency and urgency. Negative for flank pain and hematuria.  Musculoskeletal: Negative for arthralgias and back pain.  Skin: Negative for color change and rash.  Neurological: Negative for dizziness, seizures, syncope and light-headedness.  Psychiatric/Behavioral: Negative for agitation, behavioral problems and confusion.  All other systems reviewed and are negative.    Physical Exam Updated Vital Signs BP (!) 149/87 (BP Location: Right Arm)   Pulse 90   Temp 98 F (36.7 C) (Oral)   Resp 18   Ht 5\' 10"  (1.778 m)   Wt 83 kg (183 lb)   SpO2 99%   BMI 26.26 kg/m   Physical Exam  Constitutional: He is oriented to person, place, and time. He appears well-developed and well-nourished.  HENT:  Head: Normocephalic and atraumatic.  Eyes:  Conjunctivae are normal.  Neck: Normal range of motion. Neck supple. No JVD present.  Cardiovascular: Normal rate, regular rhythm and normal heart sounds.  Exam reveals no friction rub.   No murmur heard. Pulmonary/Chest: Effort normal and breath sounds normal. No respiratory distress. He has no wheezes. He has no rales.  Abdominal: Soft. Bowel sounds are normal. He exhibits no distension and no mass. There is no guarding.  Genitourinary: Penis normal. No penile erythema or penile tenderness. No discharge found.     Musculoskeletal: He exhibits no edema or deformity.  Lymphadenopathy:    He has no cervical adenopathy.  Neurological: He is alert and oriented to person, place, and time.  Skin: Skin is warm and dry. No rash noted. No erythema.  Psychiatric: He has a normal mood and affect. His behavior is normal. Thought content normal.  Nursing  note and vitals reviewed.    ED Treatments / Results  Labs (all labs ordered are listed, but only abnormal results are displayed) Labs Reviewed  URINALYSIS, ROUTINE W REFLEX MICROSCOPIC    EKG  EKG Interpretation None       Radiology No results found.  Procedures Procedures (including critical care time)  Medications Ordered in ED Medications - No data to display   Initial Impression / Assessment and Plan / ED Course  I have reviewed the triage vital signs and the nursing notes.  Pertinent labs & imaging results that were available during my care of the patient were reviewed by me and considered in my medical decision making (see chart for details).     Hematuria, dysuria, increased urinary frequency -urinalysis negative, unlikely infection but will send for culture since cystitis on imaging -Refer pt [facility] as his transplant is handled through there and to pcp to follow up on anemia.     Final Clinical Impressions(s) / ED Diagnoses   Final diagnoses:  None    New Prescriptions New Prescriptions   No  medications on file     Katherine Roan, MD 10/07/16 1205    Gareth Morgan, MD 10/08/16 1353

## 2016-10-07 NOTE — ED Notes (Signed)
Pt getting dressed.

## 2016-10-07 NOTE — ED Triage Notes (Signed)
Per Pt, Pt is coming from home with complaints of burning with urination and hematuria that has returned after two weeks. Pt was seen her and given antibiotics for the same two weeks ago. Reports taking medication as prescribed and getting better, but pain returned.

## 2016-10-08 LAB — URINE CULTURE: Culture: NO GROWTH

## 2016-10-09 DIAGNOSIS — D649 Anemia, unspecified: Secondary | ICD-10-CM | POA: Diagnosis not present

## 2016-10-09 DIAGNOSIS — D508 Other iron deficiency anemias: Secondary | ICD-10-CM | POA: Diagnosis not present

## 2016-10-09 DIAGNOSIS — N186 End stage renal disease: Secondary | ICD-10-CM | POA: Diagnosis not present

## 2016-10-09 DIAGNOSIS — N2581 Secondary hyperparathyroidism of renal origin: Secondary | ICD-10-CM | POA: Diagnosis not present

## 2016-10-09 DIAGNOSIS — E876 Hypokalemia: Secondary | ICD-10-CM | POA: Diagnosis not present

## 2016-10-11 DIAGNOSIS — D508 Other iron deficiency anemias: Secondary | ICD-10-CM | POA: Diagnosis not present

## 2016-10-11 DIAGNOSIS — N2581 Secondary hyperparathyroidism of renal origin: Secondary | ICD-10-CM | POA: Diagnosis not present

## 2016-10-11 DIAGNOSIS — N186 End stage renal disease: Secondary | ICD-10-CM | POA: Diagnosis not present

## 2016-10-11 DIAGNOSIS — D649 Anemia, unspecified: Secondary | ICD-10-CM | POA: Diagnosis not present

## 2016-10-11 DIAGNOSIS — E876 Hypokalemia: Secondary | ICD-10-CM | POA: Diagnosis not present

## 2016-10-13 NOTE — Progress Notes (Signed)
Ryan [name] is a 56 y.o. male here to Establish Care and discuss UTI, follow up from ED.   I acted as a Education administrator for [employer], PA-C Anselmo Pickler, LPN   History of Present Illness:   Chief Complaint  Patient presents with  . Establish Care    Medicare  . Urinary Tract Infection    seen in ED on 7/17   He has not had a PCP in the past. His nephrologist has been managing most of his issues and he would occasionally go to urgent care or ED for other issues.  Acute Concerns: F/U on Anemia -- patient went to the ED for hematuria and Hgb in ED on 10/07/16 was found to be 8.2, was told to follow up with his PCP for evaluation. Most recent blood transfusion in March 2018. He is unable to tell me whether or not he was given any iron during HD or if he is on any iron supplements. He reports that today he had an episode where he had brief palpitations and some flushing but that resolved. He has had some intermittent fatigue over the past couple of weeks. Most recent hospital note from nephrologist available to me was on 08/28/16 -- Dr. Lorrene Reid noted patient did not require ESA at that time and would reassess Fe status at outpatient HD. F/U on Hematuria -- resolved after ED visit; denies any urinary frequency, pain or other concerns. He was told to see a urologist and has an appointment with [facility] on 11/14/16, however the recommendation from the ED provider was to see a urologist at [facility] that is connected to the transplant team, as he has a complex medical history. He does have a hx of renal stent and ED MD suspected that this could be the cause.  Chronic Issues: ESRD on HD -- patient had a kidney transplant about 10 years ago but due to financial reasons he had to stop his anti-rejection medications last fall and thus he is back on HD. Undergoes HD treatment q TTS and is followed by Dr. Jamal Maes with [facility]. HTN -- currently maintained on clonidine,  enalapril, lasix, labetelol, nifedipine -- mgmt per Dr. Lorrene Reid  Health Maintenance: Weight -- Weight: 179 lb (81.2 kg)   Depression screen [facility] 2/9 10/14/2016  Decreased Interest 0  Down, Depressed, Hopeless 0  PHQ - 2 Score 0    No flowsheet data found.  Other providers/specialists: Dr. Lorrene Reid   Past Medical History:  Diagnosis Date  . Blood transfusion without reported diagnosis   . ESRD (end stage renal disease) ([place])      Social History   Social History  . Marital status: Divorced    Spouse name: N/A  . Number of children: N/A  . Years of education: N/A   Occupational History  . Not on file.   Social History Main Topics  . Smoking status: Never Smoker  . Smokeless tobacco: Never Used  . Alcohol use No  . Drug use: No  . Sexual activity: No   Other Topics Concern  . Not on file   Social History Narrative  . No narrative on file    Past Surgical History:  Procedure Laterality Date  . AV FISTULA PLACEMENT Left 08/28/2016   Procedure: LEFT UPPER  ARM ARTERIOVENOUS (AV) FISTULA CREATION;  Surgeon: Angelia Mould, MD;  Location: [place];  Service: Vascular;  Laterality: Left;  . KIDNEY TRANSPLANT      Family History  Problem  Relation Age of Onset  . Hypertension Mother   . Heart disease Mother   . Stroke Mother   . Cancer Father   . Kidney cancer Father   . Hypertension Sister   . Heart disease Maternal Grandmother   . Alcohol abuse Maternal Grandfather   . Mental illness Paternal Grandmother   . Learning disabilities Paternal Grandmother        Alzheimer's   . Stroke Paternal Grandfather     Allergies  Allergen Reactions  . No Known Allergies      Current Medications:   Current Outpatient Prescriptions:  .  aspirin 325 MG tablet, Take 325 mg by mouth every morning., Disp: , Rfl:  .  cloNIDine (CATAPRES) 0.1 MG tablet, Take 1 tablet (0.1 mg total) by mouth 2 (two) times daily., Disp: 60 tablet, Rfl: 0 .  enalapril (VASOTEC) 20 MG  tablet, Take 20 mg by mouth 2 (two) times daily., Disp: , Rfl:  .  furosemide (LASIX) 80 MG tablet, TAKE 1 TABLET (80 MG TOTAL) BY MOUTH 2 TIMES DAILY., Disp: , Rfl: 5 .  labetalol (NORMODYNE) 200 MG tablet, Take 200 mg by mouth 2 (two) times daily. , Disp: , Rfl:  .  multivitamin (RENA-VIT) TABS tablet, Take 1 tablet by mouth daily., Disp: , Rfl:  .  mycophenolate (MYFORTIC) 360 MG TBEC EC tablet, Take 360 mg by mouth 2 (two) times daily., Disp: , Rfl:  .  NIFEdipine (PROCARDIA XL/ADALAT-CC) 60 MG 24 hr tablet, Take 60 mg by mouth 2 (two) times daily., Disp: , Rfl:  .  Nutritional Supplements (FEEDING SUPPLEMENT, NEPRO CARB STEADY,) LIQD, Take 237 mLs by mouth 3 (three) times daily between meals., Disp: 21 Can, Rfl: 0 .  oxybutynin (DITROPAN) 5 MG tablet, Take 1 tablet (5 mg total) by mouth 3 (three) times daily., Disp: 21 tablet, Rfl: 0 .  pravastatin (PRAVACHOL) 80 MG tablet, Take 80 mg by mouth every morning., Disp: , Rfl: 2 .  prednisoLONE 5 MG TABS tablet, Take 5 mg by mouth daily. , Disp: , Rfl:  .  predniSONE (DELTASONE) 5 MG tablet, TAKE 1 TABLET BY MOUTH DAILY., Disp: , Rfl:  .  PROGRAF 1 MG capsule, Take 3 mg by mouth 2 (two) times daily. , Disp: , Rfl: 3 .  sevelamer carbonate (RENVELA) 800 MG tablet, Take 2 tablets (1,600 mg total) by mouth 3 (three) times daily with meals. (Patient taking differently: Take 800 mg by mouth 3 (three) times daily with meals. ), Disp: 30 tablet, Rfl: 0 .  tacrolimus (PROGRAF) 5 MG capsule, Take 5 mg by mouth 2 (two) times daily., Disp: , Rfl:    Review of Systems:   Review of Systems  Constitutional: Positive for malaise/fatigue. Negative for chills, fever and weight loss.  HENT: Negative for hearing loss, sinus pain and sore throat.   Eyes: Negative for blurred vision.  Respiratory: Negative for cough and shortness of breath.   Cardiovascular: Negative for chest pain, palpitations and leg swelling.  Gastrointestinal: Negative for abdominal pain,  constipation, diarrhea, heartburn, nausea and vomiting.  Genitourinary: Negative for dysuria, frequency and urgency.  Musculoskeletal: Negative for back pain, myalgias and neck pain.  Skin: Negative for itching and rash.  Neurological: Negative for dizziness, tingling, seizures, loss of consciousness and headaches.  Endo/Heme/Allergies: Negative for polydipsia.  Psychiatric/Behavioral: Negative for depression. The patient is not nervous/anxious.     Vitals:   Vitals:   10/14/16 1400  BP: 100/60  Pulse: 85  Temp: 98.4  F (36.9 C)  TempSrc: Oral  SpO2: 94%  Weight: 179 lb (81.2 kg)  Height: 5\' 10"  (1.778 m)     Body mass index is 25.68 kg/m.  Physical Exam:   Physical Exam  Constitutional: He appears well-developed. He is cooperative.  Non-toxic appearance. He does not have a sickly appearance. He does not appear ill. No distress.  HENT:  Mouth/Throat: Mucous membranes are pale and dry.  Cardiovascular: Normal rate, regular rhythm, S1 normal, S2 normal, normal heart sounds and normal pulses.   No LE edema  Pulmonary/Chest: Effort normal and breath sounds normal.  Neurological: He is alert. GCS eye subscore is 4. GCS verbal subscore is 5. GCS motor subscore is 6.  Skin: Skin is warm, dry and intact.  L BC AVF well healed, excellent bruit and thrill   Psychiatric: He has a normal mood and affect. His speech is normal and behavior is normal.  Nursing note and vitals reviewed.   Results for orders placed or performed in visit on 10/14/16  CBC with Differential/Platelet  Result Value Ref Range   WBC 7.4 4.0 - 10.5 K/uL   RBC 3.43 (L) 4.22 - 5.81 Mil/uL   Hemoglobin 8.7 Repeated and verified X2. (L) 13.0 - 17.0 g/dL   HCT 27.5 (L) 39.0 - 52.0 %   MCV 80.3 78.0 - 100.0 fl   MCHC 31.7 30.0 - 36.0 g/dL   RDW 20.8 (H) 11.5 - 15.5 %   Platelets 292.0 150.0 - 400.0 K/uL   Neutrophils Relative % 81.5 (H) 43.0 - 77.0 %   Lymphocytes Relative 8.1 (L) 12.0 - 46.0 %   Monocytes  Relative 8.9 3.0 - 12.0 %   Eosinophils Relative 0.8 0.0 - 5.0 %   Basophils Relative 0.7 0.0 - 3.0 %   Neutro Abs 6.0 1.4 - 7.7 K/uL   Lymphs Abs 0.6 (L) 0.7 - 4.0 K/uL   Monocytes Absolute 0.7 0.1 - 1.0 K/uL   Eosinophils Absolute 0.1 0.0 - 0.7 K/uL   Basophils Absolute 0.1 0.0 - 0.1 K/uL     Assessment and Plan:    [place] was seen today for establish care and urinary tract infection.  Diagnoses and all orders for this visit:  Anemia in chronic kidney disease, unspecified CKD stage Repeat labs show very slight increase of Hgb from 8.2 one week ago to 8.7 today. Will fax results to Dr. Jamal Maes and patient's outpatient HD center for notification and treatment. Advised patient if his symptoms worsen or develops dizziness, lightheadedness, etc, that he needs to go to the ER. -     CBC with Differential/Platelet  Hematuria, unspecified type Resolved. Has an appointment with [facility] in approximately 1 month. I will defer to them to decide if more specialized urologist is needed for his treatment.  Essential hypertension Management per Dr. Jamal Maes, on multiple medications.   ESRD on HD Currently on HD at [place] on TTS. Per Dr. Sanda Klein note, "I personally don't think he would be an appropriate candidate for re-transplantation because of his significant h/o poor compliance even when meds paid for - will need a much better track record."   . Reviewed expectations re: course of current medical issues. . Discussed self-management of symptoms. . Outlined signs and symptoms indicating need for more acute intervention. . Patient verbalized understanding and all questions were answered. . See orders for this visit as documented in the electronic medical record. . Patient received an After-Visit Summary.  CMA or LPN  served as Education administrator during this visit. History, Physical, and Plan performed by medical provider. Documentation and orders reviewed and attested  to.  Inda Coke, PA-C

## 2016-10-14 ENCOUNTER — Encounter: Payer: Self-pay | Admitting: Physician Assistant

## 2016-10-14 ENCOUNTER — Ambulatory Visit (INDEPENDENT_AMBULATORY_CARE_PROVIDER_SITE_OTHER): Payer: Medicare Other | Admitting: Physician Assistant

## 2016-10-14 VITALS — BP 100/60 | HR 85 | Temp 98.4°F | Ht 70.0 in | Wt 179.0 lb

## 2016-10-14 DIAGNOSIS — D631 Anemia in chronic kidney disease: Secondary | ICD-10-CM | POA: Diagnosis not present

## 2016-10-14 DIAGNOSIS — N189 Chronic kidney disease, unspecified: Secondary | ICD-10-CM

## 2016-10-14 DIAGNOSIS — N186 End stage renal disease: Secondary | ICD-10-CM | POA: Diagnosis not present

## 2016-10-14 DIAGNOSIS — R319 Hematuria, unspecified: Secondary | ICD-10-CM

## 2016-10-14 DIAGNOSIS — D649 Anemia, unspecified: Secondary | ICD-10-CM | POA: Diagnosis not present

## 2016-10-14 DIAGNOSIS — I10 Essential (primary) hypertension: Secondary | ICD-10-CM | POA: Diagnosis not present

## 2016-10-14 DIAGNOSIS — D508 Other iron deficiency anemias: Secondary | ICD-10-CM | POA: Diagnosis not present

## 2016-10-14 DIAGNOSIS — E876 Hypokalemia: Secondary | ICD-10-CM | POA: Diagnosis not present

## 2016-10-14 DIAGNOSIS — N2581 Secondary hyperparathyroidism of renal origin: Secondary | ICD-10-CM | POA: Diagnosis not present

## 2016-10-14 LAB — CBC WITH DIFFERENTIAL/PLATELET
BASOS ABS: 0.1 10*3/uL (ref 0.0–0.1)
Basophils Relative: 0.7 % (ref 0.0–3.0)
EOS ABS: 0.1 10*3/uL (ref 0.0–0.7)
Eosinophils Relative: 0.8 % (ref 0.0–5.0)
HEMATOCRIT: 27.5 % — AB (ref 39.0–52.0)
LYMPHS PCT: 8.1 % — AB (ref 12.0–46.0)
Lymphs Abs: 0.6 10*3/uL — ABNORMAL LOW (ref 0.7–4.0)
MCHC: 31.7 g/dL (ref 30.0–36.0)
MCV: 80.3 fl (ref 78.0–100.0)
MONO ABS: 0.7 10*3/uL (ref 0.1–1.0)
Monocytes Relative: 8.9 % (ref 3.0–12.0)
Neutro Abs: 6 10*3/uL (ref 1.4–7.7)
Neutrophils Relative %: 81.5 % — ABNORMAL HIGH (ref 43.0–77.0)
Platelets: 292 10*3/uL (ref 150.0–400.0)
RBC: 3.43 Mil/uL — AB (ref 4.22–5.81)
RDW: 20.8 % — ABNORMAL HIGH (ref 11.5–15.5)
WBC: 7.4 10*3/uL (ref 4.0–10.5)

## 2016-10-14 NOTE — Patient Instructions (Signed)
It was great to meet you.  We will call you about your lab results.  Please talk to nephrology about your blood pressure medications.

## 2016-10-16 ENCOUNTER — Telehealth: Payer: Self-pay | Admitting: Physician Assistant

## 2016-10-16 DIAGNOSIS — D508 Other iron deficiency anemias: Secondary | ICD-10-CM | POA: Diagnosis not present

## 2016-10-16 DIAGNOSIS — E876 Hypokalemia: Secondary | ICD-10-CM | POA: Diagnosis not present

## 2016-10-16 DIAGNOSIS — D649 Anemia, unspecified: Secondary | ICD-10-CM | POA: Diagnosis not present

## 2016-10-16 DIAGNOSIS — N186 End stage renal disease: Secondary | ICD-10-CM | POA: Diagnosis not present

## 2016-10-16 DIAGNOSIS — N2581 Secondary hyperparathyroidism of renal origin: Secondary | ICD-10-CM | POA: Diagnosis not present

## 2016-10-16 NOTE — Telephone Encounter (Signed)
ROI faxed to [facility]

## 2016-10-21 DIAGNOSIS — D649 Anemia, unspecified: Secondary | ICD-10-CM | POA: Diagnosis not present

## 2016-10-21 DIAGNOSIS — E876 Hypokalemia: Secondary | ICD-10-CM | POA: Diagnosis not present

## 2016-10-21 DIAGNOSIS — T8612 Kidney transplant failure: Secondary | ICD-10-CM | POA: Diagnosis not present

## 2016-10-21 DIAGNOSIS — D508 Other iron deficiency anemias: Secondary | ICD-10-CM | POA: Diagnosis not present

## 2016-10-21 DIAGNOSIS — N2581 Secondary hyperparathyroidism of renal origin: Secondary | ICD-10-CM | POA: Diagnosis not present

## 2016-10-21 DIAGNOSIS — N186 End stage renal disease: Secondary | ICD-10-CM | POA: Diagnosis not present

## 2016-10-21 DIAGNOSIS — Z992 Dependence on renal dialysis: Secondary | ICD-10-CM | POA: Diagnosis not present

## 2016-10-23 DIAGNOSIS — N186 End stage renal disease: Secondary | ICD-10-CM | POA: Diagnosis not present

## 2016-10-23 DIAGNOSIS — E876 Hypokalemia: Secondary | ICD-10-CM | POA: Diagnosis not present

## 2016-10-23 DIAGNOSIS — N2581 Secondary hyperparathyroidism of renal origin: Secondary | ICD-10-CM | POA: Diagnosis not present

## 2016-10-23 DIAGNOSIS — D649 Anemia, unspecified: Secondary | ICD-10-CM | POA: Diagnosis not present

## 2016-10-28 DIAGNOSIS — D649 Anemia, unspecified: Secondary | ICD-10-CM | POA: Diagnosis not present

## 2016-10-28 DIAGNOSIS — N2581 Secondary hyperparathyroidism of renal origin: Secondary | ICD-10-CM | POA: Diagnosis not present

## 2016-10-28 DIAGNOSIS — N186 End stage renal disease: Secondary | ICD-10-CM | POA: Diagnosis not present

## 2016-10-28 DIAGNOSIS — E876 Hypokalemia: Secondary | ICD-10-CM | POA: Diagnosis not present

## 2016-10-30 DIAGNOSIS — E876 Hypokalemia: Secondary | ICD-10-CM | POA: Diagnosis not present

## 2016-10-30 DIAGNOSIS — N2581 Secondary hyperparathyroidism of renal origin: Secondary | ICD-10-CM | POA: Diagnosis not present

## 2016-10-30 DIAGNOSIS — N186 End stage renal disease: Secondary | ICD-10-CM | POA: Diagnosis not present

## 2016-10-30 DIAGNOSIS — D649 Anemia, unspecified: Secondary | ICD-10-CM | POA: Diagnosis not present

## 2016-11-01 DIAGNOSIS — N186 End stage renal disease: Secondary | ICD-10-CM | POA: Diagnosis not present

## 2016-11-01 DIAGNOSIS — N2581 Secondary hyperparathyroidism of renal origin: Secondary | ICD-10-CM | POA: Diagnosis not present

## 2016-11-01 DIAGNOSIS — D649 Anemia, unspecified: Secondary | ICD-10-CM | POA: Diagnosis not present

## 2016-11-01 DIAGNOSIS — E876 Hypokalemia: Secondary | ICD-10-CM | POA: Diagnosis not present

## 2016-11-04 ENCOUNTER — Encounter: Payer: Self-pay | Admitting: Vascular Surgery

## 2016-11-04 DIAGNOSIS — N2581 Secondary hyperparathyroidism of renal origin: Secondary | ICD-10-CM | POA: Diagnosis not present

## 2016-11-04 DIAGNOSIS — D649 Anemia, unspecified: Secondary | ICD-10-CM | POA: Diagnosis not present

## 2016-11-04 DIAGNOSIS — N186 End stage renal disease: Secondary | ICD-10-CM | POA: Diagnosis not present

## 2016-11-04 DIAGNOSIS — E876 Hypokalemia: Secondary | ICD-10-CM | POA: Diagnosis not present

## 2016-11-06 DIAGNOSIS — N2581 Secondary hyperparathyroidism of renal origin: Secondary | ICD-10-CM | POA: Diagnosis not present

## 2016-11-06 DIAGNOSIS — N186 End stage renal disease: Secondary | ICD-10-CM | POA: Diagnosis not present

## 2016-11-06 DIAGNOSIS — D649 Anemia, unspecified: Secondary | ICD-10-CM | POA: Diagnosis not present

## 2016-11-06 DIAGNOSIS — E876 Hypokalemia: Secondary | ICD-10-CM | POA: Diagnosis not present

## 2016-11-08 DIAGNOSIS — E876 Hypokalemia: Secondary | ICD-10-CM | POA: Diagnosis not present

## 2016-11-08 DIAGNOSIS — D649 Anemia, unspecified: Secondary | ICD-10-CM | POA: Diagnosis not present

## 2016-11-08 DIAGNOSIS — N186 End stage renal disease: Secondary | ICD-10-CM | POA: Diagnosis not present

## 2016-11-08 DIAGNOSIS — N2581 Secondary hyperparathyroidism of renal origin: Secondary | ICD-10-CM | POA: Diagnosis not present

## 2016-11-11 ENCOUNTER — Ambulatory Visit (HOSPITAL_COMMUNITY)
Admission: RE | Admit: 2016-11-11 | Discharge: 2016-11-11 | Disposition: A | Discharge status: Home / Self Care | Payer: Medicare Other | Source: Ambulatory Visit | Attending: Vascular Surgery | Admitting: Vascular Surgery

## 2016-11-11 DIAGNOSIS — N184 Chronic kidney disease, stage 4 (severe): Secondary | ICD-10-CM | POA: Diagnosis not present

## 2016-11-11 DIAGNOSIS — E876 Hypokalemia: Secondary | ICD-10-CM | POA: Diagnosis not present

## 2016-11-11 DIAGNOSIS — D649 Anemia, unspecified: Secondary | ICD-10-CM | POA: Diagnosis not present

## 2016-11-11 DIAGNOSIS — N186 End stage renal disease: Secondary | ICD-10-CM | POA: Diagnosis not present

## 2016-11-11 DIAGNOSIS — N2581 Secondary hyperparathyroidism of renal origin: Secondary | ICD-10-CM | POA: Diagnosis not present

## 2016-11-11 DIAGNOSIS — Z9889 Other specified postprocedural states: Secondary | ICD-10-CM | POA: Diagnosis not present

## 2016-11-12 ENCOUNTER — Ambulatory Visit (INDEPENDENT_AMBULATORY_CARE_PROVIDER_SITE_OTHER): Payer: Self-pay | Admitting: Vascular Surgery

## 2016-11-12 ENCOUNTER — Encounter: Payer: Self-pay | Admitting: *Deleted

## 2016-11-12 ENCOUNTER — Encounter: Payer: Self-pay | Admitting: Vascular Surgery

## 2016-11-12 VITALS — BP 158/97 | HR 88 | Temp 97.8°F | Ht 70.0 in | Wt 183.0 lb

## 2016-11-12 DIAGNOSIS — Z48812 Encounter for surgical aftercare following surgery on the circulatory system: Secondary | ICD-10-CM

## 2016-11-12 NOTE — Progress Notes (Signed)
Patient name: Ryan [name] MRN: [id number] DOB: [birth year] Sex: male  REASON FOR VISIT:    Follow up after left brachiocephalic AV fistula  HPI:   Ryan [name] is a pleasant 56 y.o. male who had a left brachiocephalic AV fistula performed on 08/28/2016. I saw him in follow up on 10/02/2016. Duplex scan at that time showed that the diameters of the fistula ranged from 0.36-0.59 cm. There was an area of increased velocity within the midportion of the fistula. I did not think the fistula was adequate in size yet but has only been about 5 weeks. I recommended a follow up study in any comes in for that visit.  He has no specific complaints. His catheter has been working well. He denies pain or paresthesias in his left arm.  The patient dialyzes on Tuesdays Thursdays and Saturdays with a tunneled dialysis catheter.  Current Outpatient Prescriptions  Medication Sig Dispense Refill  . aspirin 325 MG tablet Take 325 mg by mouth every morning.    . cloNIDine (CATAPRES) 0.1 MG tablet Take 1 tablet (0.1 mg total) by mouth 2 (two) times daily. (Patient taking differently: Take 0.1 mg by mouth 2 (two) times daily. Takes a half tablet) 60 tablet 0  . enalapril (VASOTEC) 20 MG tablet Take 20 mg by mouth 2 (two) times daily.    . furosemide (LASIX) 80 MG tablet TAKE 1 TABLET (80 MG TOTAL) BY MOUTH 2 TIMES DAILY.  5  . labetalol (NORMODYNE) 200 MG tablet Take 200 mg by mouth 2 (two) times daily.     . multivitamin (RENA-VIT) TABS tablet Take 1 tablet by mouth daily.    . mycophenolate (MYFORTIC) 360 MG TBEC EC tablet Take 360 mg by mouth 2 (two) times daily.    Marland Kitchen NIFEdipine (PROCARDIA XL/ADALAT-CC) 60 MG 24 hr tablet Take 60 mg by mouth 2 (two) times daily.    . Nutritional Supplements (FEEDING SUPPLEMENT, NEPRO CARB STEADY,) LIQD Take 237 mLs by mouth 3 (three) times daily between meals. 21 Can 0  . pravastatin (PRAVACHOL) 80 MG tablet Take 80 mg by mouth every morning.  2  . predniSONE  (DELTASONE) 5 MG tablet TAKE 1 TABLET BY MOUTH DAILY.    Marland Kitchen PROGRAF 1 MG capsule Take 3 mg by mouth 2 (two) times daily.   3  . sevelamer carbonate (RENVELA) 800 MG tablet Take 2 tablets (1,600 mg total) by mouth 3 (three) times daily with meals. (Patient taking differently: Take 800 mg by mouth 3 (three) times daily with meals. ) 30 tablet 0  . tacrolimus (PROGRAF) 5 MG capsule Take 5 mg by mouth 2 (two) times daily.    . prednisoLONE 5 MG TABS tablet Take 5 mg by mouth daily.      No current facility-administered medications for this visit.     REVIEW OF SYSTEMS:  [X]  denotes positive finding, [ ]  denotes negative finding Cardiac  Comments:  Chest pain or chest pressure:    Shortness of breath upon exertion:    Short of breath when lying flat:    Irregular heart rhythm:    Constitutional    Fever or chills:     PHYSICAL EXAM:   Vitals:   11/12/16 1516  BP: (!) 158/97  Pulse: 88  Temp: 97.8 F (36.6 C)  TempSrc: Oral  SpO2: 96%  Weight: 183 lb (83 kg)  Height: 5\' 10"  (1.778 m)    GENERAL: The patient is a well-nourished male, in no acute distress.  The vital signs are documented above. CARDIOVASCULAR: There is a regular rate and rhythm. PULMONARY: There is good air exchange bilaterally without wheezing or rales. He has an excellent thrill in his fistula which is slightly pulsatile above the antecubital level. He has a palpable left radial pulse.  DATA:   DUPLEX OF LEFT BRACHIOCEPHALIC AV FISTULA: I reviewed the duplex scan that was done yesterday of the left brachiocephalic AV fistula. The diameters of the fistula have improved. The diameters range from 0.51-0.86 cm. The area of increased velocity in the midportion of the fistula appears to be related to a valve leaflet. There is also a branch of the cephalic vein which originates near this area.  MEDICAL ISSUES:   STATUS POST LEFT BRACHIOCEPHALIC AV FISTULA: The fistula has enlarged compared to his previous study but is  still slightly on the small size. In addition he has an area of stenosis in the central portion of the fistula potential he related to a valve leaflet. There is also a competing branch in this area. This reason I recommended we proceed with a fistulogram to further evaluate this. If he has a stenosis amenable to venoplasty this could potentially be done at the same time. If the competing branches significant he could potentially require ligation of the branch in the operating room. If the stenosis looks like it would be better addressed with a patch angioplasty this could also be done in the operating room. The procedure is scheduled for a 2718. We have discussed the indications for the procedure and the potential complications and he is agreeable to proceed.  [name] [facility] [place] [phone]

## 2016-11-13 ENCOUNTER — Other Ambulatory Visit: Payer: Self-pay | Admitting: *Deleted

## 2016-11-13 DIAGNOSIS — N186 End stage renal disease: Secondary | ICD-10-CM | POA: Diagnosis not present

## 2016-11-13 DIAGNOSIS — N2581 Secondary hyperparathyroidism of renal origin: Secondary | ICD-10-CM | POA: Diagnosis not present

## 2016-11-13 DIAGNOSIS — E876 Hypokalemia: Secondary | ICD-10-CM | POA: Diagnosis not present

## 2016-11-13 DIAGNOSIS — D649 Anemia, unspecified: Secondary | ICD-10-CM | POA: Diagnosis not present

## 2016-11-15 DIAGNOSIS — N186 End stage renal disease: Secondary | ICD-10-CM | POA: Diagnosis not present

## 2016-11-15 DIAGNOSIS — D649 Anemia, unspecified: Secondary | ICD-10-CM | POA: Diagnosis not present

## 2016-11-15 DIAGNOSIS — N2581 Secondary hyperparathyroidism of renal origin: Secondary | ICD-10-CM | POA: Diagnosis not present

## 2016-11-15 DIAGNOSIS — E876 Hypokalemia: Secondary | ICD-10-CM | POA: Diagnosis not present

## 2016-11-17 ENCOUNTER — Encounter (HOSPITAL_COMMUNITY): Admission: RE | Payer: Self-pay | Source: Ambulatory Visit

## 2016-11-17 ENCOUNTER — Ambulatory Visit (HOSPITAL_COMMUNITY): Admission: RE | Admit: 2016-11-17 | Payer: Medicare Other | Source: Ambulatory Visit | Admitting: Vascular Surgery

## 2016-11-17 SURGERY — A/V FISTULAGRAM
Anesthesia: LOCAL | Laterality: Left

## 2016-11-18 DIAGNOSIS — N186 End stage renal disease: Secondary | ICD-10-CM | POA: Diagnosis not present

## 2016-11-18 DIAGNOSIS — D649 Anemia, unspecified: Secondary | ICD-10-CM | POA: Diagnosis not present

## 2016-11-18 DIAGNOSIS — N2581 Secondary hyperparathyroidism of renal origin: Secondary | ICD-10-CM | POA: Diagnosis not present

## 2016-11-18 DIAGNOSIS — E876 Hypokalemia: Secondary | ICD-10-CM | POA: Diagnosis not present

## 2016-11-20 DIAGNOSIS — E876 Hypokalemia: Secondary | ICD-10-CM | POA: Diagnosis not present

## 2016-11-20 DIAGNOSIS — N2581 Secondary hyperparathyroidism of renal origin: Secondary | ICD-10-CM | POA: Diagnosis not present

## 2016-11-20 DIAGNOSIS — D649 Anemia, unspecified: Secondary | ICD-10-CM | POA: Diagnosis not present

## 2016-11-20 DIAGNOSIS — N186 End stage renal disease: Secondary | ICD-10-CM | POA: Diagnosis not present

## 2016-11-21 DIAGNOSIS — T8612 Kidney transplant failure: Secondary | ICD-10-CM | POA: Diagnosis not present

## 2016-11-21 DIAGNOSIS — Z992 Dependence on renal dialysis: Secondary | ICD-10-CM | POA: Diagnosis not present

## 2016-11-21 DIAGNOSIS — N186 End stage renal disease: Secondary | ICD-10-CM | POA: Diagnosis not present

## 2016-11-22 DIAGNOSIS — N2581 Secondary hyperparathyroidism of renal origin: Secondary | ICD-10-CM | POA: Diagnosis not present

## 2016-11-22 DIAGNOSIS — E876 Hypokalemia: Secondary | ICD-10-CM | POA: Diagnosis not present

## 2016-11-22 DIAGNOSIS — D649 Anemia, unspecified: Secondary | ICD-10-CM | POA: Diagnosis not present

## 2016-11-22 DIAGNOSIS — Z23 Encounter for immunization: Secondary | ICD-10-CM | POA: Diagnosis not present

## 2016-11-22 DIAGNOSIS — N186 End stage renal disease: Secondary | ICD-10-CM | POA: Diagnosis not present

## 2016-11-22 DIAGNOSIS — D508 Other iron deficiency anemias: Secondary | ICD-10-CM | POA: Diagnosis not present

## 2016-11-25 DIAGNOSIS — Z23 Encounter for immunization: Secondary | ICD-10-CM | POA: Diagnosis not present

## 2016-11-25 DIAGNOSIS — D508 Other iron deficiency anemias: Secondary | ICD-10-CM | POA: Diagnosis not present

## 2016-11-25 DIAGNOSIS — E876 Hypokalemia: Secondary | ICD-10-CM | POA: Diagnosis not present

## 2016-11-25 DIAGNOSIS — N186 End stage renal disease: Secondary | ICD-10-CM | POA: Diagnosis not present

## 2016-11-25 DIAGNOSIS — D649 Anemia, unspecified: Secondary | ICD-10-CM | POA: Diagnosis not present

## 2016-11-25 DIAGNOSIS — N2581 Secondary hyperparathyroidism of renal origin: Secondary | ICD-10-CM | POA: Diagnosis not present

## 2016-11-27 DIAGNOSIS — N2581 Secondary hyperparathyroidism of renal origin: Secondary | ICD-10-CM | POA: Diagnosis not present

## 2016-11-27 DIAGNOSIS — D508 Other iron deficiency anemias: Secondary | ICD-10-CM | POA: Diagnosis not present

## 2016-11-27 DIAGNOSIS — N186 End stage renal disease: Secondary | ICD-10-CM | POA: Diagnosis not present

## 2016-11-27 DIAGNOSIS — D649 Anemia, unspecified: Secondary | ICD-10-CM | POA: Diagnosis not present

## 2016-11-27 DIAGNOSIS — Z23 Encounter for immunization: Secondary | ICD-10-CM | POA: Diagnosis not present

## 2016-11-27 DIAGNOSIS — E876 Hypokalemia: Secondary | ICD-10-CM | POA: Diagnosis not present

## 2016-11-29 DIAGNOSIS — N2581 Secondary hyperparathyroidism of renal origin: Secondary | ICD-10-CM | POA: Diagnosis not present

## 2016-11-29 DIAGNOSIS — N186 End stage renal disease: Secondary | ICD-10-CM | POA: Diagnosis not present

## 2016-11-29 DIAGNOSIS — E876 Hypokalemia: Secondary | ICD-10-CM | POA: Diagnosis not present

## 2016-11-29 DIAGNOSIS — D649 Anemia, unspecified: Secondary | ICD-10-CM | POA: Diagnosis not present

## 2016-11-29 DIAGNOSIS — D508 Other iron deficiency anemias: Secondary | ICD-10-CM | POA: Diagnosis not present

## 2016-11-29 DIAGNOSIS — Z23 Encounter for immunization: Secondary | ICD-10-CM | POA: Diagnosis not present

## 2016-12-02 ENCOUNTER — Other Ambulatory Visit: Payer: Self-pay

## 2016-12-02 DIAGNOSIS — N186 End stage renal disease: Secondary | ICD-10-CM | POA: Diagnosis not present

## 2016-12-02 DIAGNOSIS — E876 Hypokalemia: Secondary | ICD-10-CM | POA: Diagnosis not present

## 2016-12-02 DIAGNOSIS — Z23 Encounter for immunization: Secondary | ICD-10-CM | POA: Diagnosis not present

## 2016-12-02 DIAGNOSIS — D649 Anemia, unspecified: Secondary | ICD-10-CM | POA: Diagnosis not present

## 2016-12-02 DIAGNOSIS — N2581 Secondary hyperparathyroidism of renal origin: Secondary | ICD-10-CM | POA: Diagnosis not present

## 2016-12-02 DIAGNOSIS — D508 Other iron deficiency anemias: Secondary | ICD-10-CM | POA: Diagnosis not present

## 2016-12-04 DIAGNOSIS — N186 End stage renal disease: Secondary | ICD-10-CM | POA: Diagnosis not present

## 2016-12-04 DIAGNOSIS — D508 Other iron deficiency anemias: Secondary | ICD-10-CM | POA: Diagnosis not present

## 2016-12-04 DIAGNOSIS — D649 Anemia, unspecified: Secondary | ICD-10-CM | POA: Diagnosis not present

## 2016-12-04 DIAGNOSIS — N2581 Secondary hyperparathyroidism of renal origin: Secondary | ICD-10-CM | POA: Diagnosis not present

## 2016-12-04 DIAGNOSIS — E876 Hypokalemia: Secondary | ICD-10-CM | POA: Diagnosis not present

## 2016-12-04 DIAGNOSIS — Z23 Encounter for immunization: Secondary | ICD-10-CM | POA: Diagnosis not present

## 2016-12-06 DIAGNOSIS — D649 Anemia, unspecified: Secondary | ICD-10-CM | POA: Diagnosis not present

## 2016-12-06 DIAGNOSIS — Z23 Encounter for immunization: Secondary | ICD-10-CM | POA: Diagnosis not present

## 2016-12-06 DIAGNOSIS — N2581 Secondary hyperparathyroidism of renal origin: Secondary | ICD-10-CM | POA: Diagnosis not present

## 2016-12-06 DIAGNOSIS — E876 Hypokalemia: Secondary | ICD-10-CM | POA: Diagnosis not present

## 2016-12-06 DIAGNOSIS — N186 End stage renal disease: Secondary | ICD-10-CM | POA: Diagnosis not present

## 2016-12-06 DIAGNOSIS — D508 Other iron deficiency anemias: Secondary | ICD-10-CM | POA: Diagnosis not present

## 2016-12-09 DIAGNOSIS — D508 Other iron deficiency anemias: Secondary | ICD-10-CM | POA: Diagnosis not present

## 2016-12-09 DIAGNOSIS — Z23 Encounter for immunization: Secondary | ICD-10-CM | POA: Diagnosis not present

## 2016-12-09 DIAGNOSIS — D649 Anemia, unspecified: Secondary | ICD-10-CM | POA: Diagnosis not present

## 2016-12-09 DIAGNOSIS — E876 Hypokalemia: Secondary | ICD-10-CM | POA: Diagnosis not present

## 2016-12-09 DIAGNOSIS — N2581 Secondary hyperparathyroidism of renal origin: Secondary | ICD-10-CM | POA: Diagnosis not present

## 2016-12-09 DIAGNOSIS — N186 End stage renal disease: Secondary | ICD-10-CM | POA: Diagnosis not present

## 2016-12-11 DIAGNOSIS — E876 Hypokalemia: Secondary | ICD-10-CM | POA: Diagnosis not present

## 2016-12-11 DIAGNOSIS — Z23 Encounter for immunization: Secondary | ICD-10-CM | POA: Diagnosis not present

## 2016-12-11 DIAGNOSIS — N2581 Secondary hyperparathyroidism of renal origin: Secondary | ICD-10-CM | POA: Diagnosis not present

## 2016-12-11 DIAGNOSIS — D508 Other iron deficiency anemias: Secondary | ICD-10-CM | POA: Diagnosis not present

## 2016-12-11 DIAGNOSIS — N186 End stage renal disease: Secondary | ICD-10-CM | POA: Diagnosis not present

## 2016-12-11 DIAGNOSIS — D649 Anemia, unspecified: Secondary | ICD-10-CM | POA: Diagnosis not present

## 2016-12-13 DIAGNOSIS — D649 Anemia, unspecified: Secondary | ICD-10-CM | POA: Diagnosis not present

## 2016-12-13 DIAGNOSIS — D508 Other iron deficiency anemias: Secondary | ICD-10-CM | POA: Diagnosis not present

## 2016-12-13 DIAGNOSIS — N186 End stage renal disease: Secondary | ICD-10-CM | POA: Diagnosis not present

## 2016-12-13 DIAGNOSIS — Z23 Encounter for immunization: Secondary | ICD-10-CM | POA: Diagnosis not present

## 2016-12-13 DIAGNOSIS — N2581 Secondary hyperparathyroidism of renal origin: Secondary | ICD-10-CM | POA: Diagnosis not present

## 2016-12-13 DIAGNOSIS — E876 Hypokalemia: Secondary | ICD-10-CM | POA: Diagnosis not present

## 2016-12-15 ENCOUNTER — Encounter (HOSPITAL_COMMUNITY): Payer: Self-pay | Admitting: Vascular Surgery

## 2016-12-15 ENCOUNTER — Encounter (HOSPITAL_COMMUNITY)
Admission: RE | Disposition: A | Discharge status: Home / Self Care | Payer: Self-pay | Source: Ambulatory Visit | Attending: Vascular Surgery

## 2016-12-15 ENCOUNTER — Ambulatory Visit (HOSPITAL_COMMUNITY)
Admission: RE | Admit: 2016-12-15 | Discharge: 2016-12-15 | Disposition: A | Discharge status: Home / Self Care | Payer: Medicare Other | Source: Ambulatory Visit | Attending: Vascular Surgery | Admitting: Vascular Surgery

## 2016-12-15 DIAGNOSIS — Z7952 Long term (current) use of systemic steroids: Secondary | ICD-10-CM | POA: Insufficient documentation

## 2016-12-15 DIAGNOSIS — Z7982 Long term (current) use of aspirin: Secondary | ICD-10-CM | POA: Diagnosis not present

## 2016-12-15 DIAGNOSIS — T82898A Other specified complication of vascular prosthetic devices, implants and grafts, initial encounter: Secondary | ICD-10-CM | POA: Insufficient documentation

## 2016-12-15 DIAGNOSIS — N186 End stage renal disease: Secondary | ICD-10-CM | POA: Diagnosis not present

## 2016-12-15 DIAGNOSIS — Z79899 Other long term (current) drug therapy: Secondary | ICD-10-CM | POA: Insufficient documentation

## 2016-12-15 DIAGNOSIS — Y832 Surgical operation with anastomosis, bypass or graft as the cause of abnormal reaction of the patient, or of later complication, without mention of misadventure at the time of the procedure: Secondary | ICD-10-CM | POA: Insufficient documentation

## 2016-12-15 DIAGNOSIS — Z992 Dependence on renal dialysis: Secondary | ICD-10-CM | POA: Diagnosis not present

## 2016-12-15 DIAGNOSIS — N185 Chronic kidney disease, stage 5: Secondary | ICD-10-CM | POA: Diagnosis not present

## 2016-12-15 HISTORY — PX: A/V FISTULAGRAM: CATH118298

## 2016-12-15 LAB — POCT I-STAT, CHEM 8
BUN: 49 mg/dL — ABNORMAL HIGH (ref 6–20)
CREATININE: 10 mg/dL — AB (ref 0.61–1.24)
Calcium, Ion: 1.01 mmol/L — ABNORMAL LOW (ref 1.15–1.40)
Chloride: 104 mmol/L (ref 101–111)
GLUCOSE: 113 mg/dL — AB (ref 65–99)
HEMATOCRIT: 43 % (ref 39.0–52.0)
HEMOGLOBIN: 14.6 g/dL (ref 13.0–17.0)
POTASSIUM: 4.5 mmol/L (ref 3.5–5.1)
Sodium: 138 mmol/L (ref 135–145)
TCO2: 26 mmol/L (ref 22–32)

## 2016-12-15 SURGERY — A/V FISTULAGRAM
Anesthesia: LOCAL | Laterality: Left

## 2016-12-15 MED ORDER — HEPARIN (PORCINE) IN NACL 2-0.9 UNIT/ML-% IJ SOLN
INTRAMUSCULAR | Status: AC
  Filled 2016-12-15: qty 500

## 2016-12-15 MED ORDER — LIDOCAINE HCL (PF) 1 % IJ SOLN
INTRAMUSCULAR | Status: DC | PRN
  Administered 2016-12-15: 2 mL

## 2016-12-15 MED ORDER — SODIUM CHLORIDE 0.9% FLUSH: 3.0000 mL | INTRAVENOUS | Status: DC | PRN

## 2016-12-15 MED ORDER — IODIXANOL 320 MG/ML IV SOLN
INTRAVENOUS | Status: DC | PRN
Start: 2016-12-15 — End: 2016-12-15
  Administered 2016-12-15: 30 mL via INTRAVENOUS

## 2016-12-15 MED ORDER — HEPARIN (PORCINE) IN NACL 2-0.9 UNIT/ML-% IJ SOLN
INTRAMUSCULAR | Status: AC | PRN
  Administered 2016-12-15: 500 mL

## 2016-12-15 MED ORDER — LIDOCAINE HCL 2 % IJ SOLN
INTRAMUSCULAR | Status: AC
  Filled 2016-12-15: qty 10

## 2016-12-15 SURGICAL SUPPLY — 10 items
BAG SNAP BAND KOVER 36X36 (MISCELLANEOUS) ×2 IMPLANT
COVER DOME SNAP 22 D (MISCELLANEOUS) ×4 IMPLANT
COVER PRB 48X5XTLSCP FOLD TPE (BAG) ×1 IMPLANT
COVER PROBE 5X48 (BAG) ×2
KIT MICROINTRODUCER STIFF 5F (SHEATH) ×2 IMPLANT
PROTECTION STATION PRESSURIZED (MISCELLANEOUS) ×2
STATION PROTECTION PRESSURIZED (MISCELLANEOUS) ×1 IMPLANT
STOPCOCK MORSE 400PSI 3WAY (MISCELLANEOUS) ×2 IMPLANT
TRAY PV CATH (CUSTOM PROCEDURE TRAY) ×2 IMPLANT
TUBING CIL FLEX 10 FLL-RA (TUBING) ×2 IMPLANT

## 2016-12-15 NOTE — H&P (Signed)
Patient name: Ryan [name]    MRN: [id number]        DOB: [birth year]            Sex: male  REASON FOR VISIT:    Follow up after left brachiocephalic AV fistula  HPI:   Ryan [name] is a pleasant 56 y.o. male who had a left brachiocephalic AV fistula performed on 08/28/2016. I saw him in follow up on 10/02/2016. Duplex scan at that time showed that the diameters of the fistula ranged from 0.36-0.59 cm. There was an area of increased velocity within the midportion of the fistula. I did not think the fistula was adequate in size yet but has only been about 5 weeks. I recommended a follow up study in any comes in for that visit.  He has no specific complaints. His catheter has been working well. He denies pain or paresthesias in his left arm.  The patient dialyzes on Tuesdays Thursdays and Saturdays with a tunneled dialysis catheter.        Current Outpatient Prescriptions  Medication Sig Dispense Refill  . aspirin 325 MG tablet Take 325 mg by mouth every morning.    . cloNIDine (CATAPRES) 0.1 MG tablet Take 1 tablet (0.1 mg total) by mouth 2 (two) times daily. (Patient taking differently: Take 0.1 mg by mouth 2 (two) times daily. Takes a half tablet) 60 tablet 0  . enalapril (VASOTEC) 20 MG tablet Take 20 mg by mouth 2 (two) times daily.    . furosemide (LASIX) 80 MG tablet TAKE 1 TABLET (80 MG TOTAL) BY MOUTH 2 TIMES DAILY.  5  . labetalol (NORMODYNE) 200 MG tablet Take 200 mg by mouth 2 (two) times daily.     . multivitamin (RENA-VIT) TABS tablet Take 1 tablet by mouth daily.    . mycophenolate (MYFORTIC) 360 MG TBEC EC tablet Take 360 mg by mouth 2 (two) times daily.    Marland Kitchen NIFEdipine (PROCARDIA XL/ADALAT-CC) 60 MG 24 hr tablet Take 60 mg by mouth 2 (two) times daily.    . Nutritional Supplements (FEEDING SUPPLEMENT, NEPRO CARB STEADY,) LIQD Take 237 mLs by mouth 3 (three) times daily between meals. 21 Can 0  . pravastatin (PRAVACHOL) 80 MG tablet Take 80  mg by mouth every morning.  2  . predniSONE (DELTASONE) 5 MG tablet TAKE 1 TABLET BY MOUTH DAILY.    Marland Kitchen PROGRAF 1 MG capsule Take 3 mg by mouth 2 (two) times daily.   3  . sevelamer carbonate (RENVELA) 800 MG tablet Take 2 tablets (1,600 mg total) by mouth 3 (three) times daily with meals. (Patient taking differently: Take 800 mg by mouth 3 (three) times daily with meals. ) 30 tablet 0  . tacrolimus (PROGRAF) 5 MG capsule Take 5 mg by mouth 2 (two) times daily.    . prednisoLONE 5 MG TABS tablet Take 5 mg by mouth daily.      No current facility-administered medications for this visit.     REVIEW OF SYSTEMS:  [X]  denotes positive finding, [ ]  denotes negative finding Cardiac  Comments:  Chest pain or chest pressure:    Shortness of breath upon exertion:    Short of breath when lying flat:    Irregular heart rhythm:    Constitutional    Fever or chills:     PHYSICAL EXAM:      Vitals:   11/12/16 1516  BP: (!) 158/97  Pulse: 88  Temp: 97.8 F (36.6 C)  TempSrc: Oral  SpO2: 96%  Weight: 183 lb (83 kg)  Height: 5\' 10"  (1.778 m)    GENERAL: The patient is a well-nourished male, in no acute distress. The vital signs are documented above. CARDIOVASCULAR: There is a regular rate and rhythm. PULMONARY: There is good air exchange bilaterally without wheezing or rales. He has an excellent thrill in his fistula which is slightly pulsatile above the antecubital level. He has a palpable left radial pulse.  DATA:   DUPLEX OF LEFT BRACHIOCEPHALIC AV FISTULA: I reviewed the duplex scan that was done yesterday of the left brachiocephalic AV fistula. The diameters of the fistula have improved. The diameters range from 0.51-0.86 cm. The area of increased velocity in the midportion of the fistula appears to be related to a valve leaflet. There is also a branch of the cephalic vein which originates near this area.  MEDICAL ISSUES:   STATUS POST LEFT  BRACHIOCEPHALIC AV FISTULA: The fistula has enlarged compared to his previous study but is still slightly on the small size. In addition he has an area of stenosis in the central portion of the fistula potential he related to a valve leaflet. There is also a competing branch in this area. This reason I recommended we proceed with a fistulogram to further evaluate this. If he has a stenosis amenable to venoplasty this could potentially be done at the same time. If the competing branches significant he could potentially require ligation of the branch in the operating room. If the stenosis looks like it would be better addressed with a patch angioplasty this could also be done in the operating room. The procedure is scheduled for a 2718. We have discussed the indications for the procedure and the potential complications and he is agreeable to proceed.  [name] [facility] [place] [phone]

## 2016-12-15 NOTE — Discharge Instructions (Signed)

## 2016-12-15 NOTE — Op Note (Signed)
   PATIENT: Ryan [name]      MRN: [id number] DOB: 31-Jul-1960    DATE OF PROCEDURE: 12/15/2016  INDICATIONS:    Ryan [name] is a 56 y.o. male had a left brachiocephalic AV fistula performed on 08/28/2016. I saw him in follow up on 10/02/2016. Duplex scan at that time showed that the diameters of the fistula ranged from 0.36-0.59 cm. Subsequent duplex on 12/15/2016 showed that the diameters of the fistula ranged from 0.51-0.86 cm. There were some elevated velocities in the central portion of the fistula and also a competing branch. He presents for a fistulogram and possible venoplasty.  PROCEDURE:    1. Ultrasound-guided access to the left brachiocephalic AV fistula 2. Fistulogram left brachiocephalic AV fistula   SURGEON: Judeth Cornfield. Scot Dock, MD, FACS  ANESTHESIA: Local   EBL: Minimal  TECHNIQUE: The patient was taken to the peripheral vascular lab. The left arm was prepped and draped in the usual sterile fashion. Under ultrasound guidance, after the skin was anesthetized, the proximal fistula was cannulated with a micropuncture needle and a micropuncture sheath introduced over the wire. A fistulogram wasn't obtained evaluating the fistula from the site of cannulation to include the central veins. In addition we inflated a blood pressure cuff proximal to the fistula to show reflux into the arterial system.  FINDINGS:   1. There is no central venous stenosis. 2. The fistula is widely patent with only some mild narrowing in the central portion of the fistula which does not appear to be significant. 3. The arterial anastomosis is widely patent.  CLINICAL NOTE: I think would be safe to attempt to cannulate the fistula at this point. If there are problems with flow then we could consider ligating the competing branch.  [name], MD, FACS [facility]  DATE OF DICTATION:   12/15/2016

## 2016-12-16 DIAGNOSIS — E876 Hypokalemia: Secondary | ICD-10-CM | POA: Diagnosis not present

## 2016-12-16 DIAGNOSIS — N186 End stage renal disease: Secondary | ICD-10-CM | POA: Diagnosis not present

## 2016-12-16 DIAGNOSIS — Z23 Encounter for immunization: Secondary | ICD-10-CM | POA: Diagnosis not present

## 2016-12-16 DIAGNOSIS — N2581 Secondary hyperparathyroidism of renal origin: Secondary | ICD-10-CM | POA: Diagnosis not present

## 2016-12-16 DIAGNOSIS — D649 Anemia, unspecified: Secondary | ICD-10-CM | POA: Diagnosis not present

## 2016-12-16 DIAGNOSIS — D508 Other iron deficiency anemias: Secondary | ICD-10-CM | POA: Diagnosis not present

## 2016-12-16 MED FILL — Lidocaine HCl Local Inj 2%: INTRAMUSCULAR | Qty: 10 | Status: AC

## 2016-12-18 DIAGNOSIS — N2581 Secondary hyperparathyroidism of renal origin: Secondary | ICD-10-CM | POA: Diagnosis not present

## 2016-12-18 DIAGNOSIS — E876 Hypokalemia: Secondary | ICD-10-CM | POA: Diagnosis not present

## 2016-12-18 DIAGNOSIS — D508 Other iron deficiency anemias: Secondary | ICD-10-CM | POA: Diagnosis not present

## 2016-12-18 DIAGNOSIS — N186 End stage renal disease: Secondary | ICD-10-CM | POA: Diagnosis not present

## 2016-12-18 DIAGNOSIS — Z23 Encounter for immunization: Secondary | ICD-10-CM | POA: Diagnosis not present

## 2016-12-18 DIAGNOSIS — D649 Anemia, unspecified: Secondary | ICD-10-CM | POA: Diagnosis not present

## 2016-12-20 DIAGNOSIS — D508 Other iron deficiency anemias: Secondary | ICD-10-CM | POA: Diagnosis not present

## 2016-12-20 DIAGNOSIS — E876 Hypokalemia: Secondary | ICD-10-CM | POA: Diagnosis not present

## 2016-12-20 DIAGNOSIS — D649 Anemia, unspecified: Secondary | ICD-10-CM | POA: Diagnosis not present

## 2016-12-20 DIAGNOSIS — Z23 Encounter for immunization: Secondary | ICD-10-CM | POA: Diagnosis not present

## 2016-12-20 DIAGNOSIS — N2581 Secondary hyperparathyroidism of renal origin: Secondary | ICD-10-CM | POA: Diagnosis not present

## 2016-12-20 DIAGNOSIS — N186 End stage renal disease: Secondary | ICD-10-CM | POA: Diagnosis not present

## 2016-12-21 DIAGNOSIS — T8612 Kidney transplant failure: Secondary | ICD-10-CM | POA: Diagnosis not present

## 2016-12-21 DIAGNOSIS — Z992 Dependence on renal dialysis: Secondary | ICD-10-CM | POA: Diagnosis not present

## 2016-12-21 DIAGNOSIS — N186 End stage renal disease: Secondary | ICD-10-CM | POA: Diagnosis not present

## 2016-12-23 DIAGNOSIS — D649 Anemia, unspecified: Secondary | ICD-10-CM | POA: Diagnosis not present

## 2016-12-23 DIAGNOSIS — D508 Other iron deficiency anemias: Secondary | ICD-10-CM | POA: Diagnosis not present

## 2016-12-23 DIAGNOSIS — N2581 Secondary hyperparathyroidism of renal origin: Secondary | ICD-10-CM | POA: Diagnosis not present

## 2016-12-23 DIAGNOSIS — E876 Hypokalemia: Secondary | ICD-10-CM | POA: Diagnosis not present

## 2016-12-23 DIAGNOSIS — N186 End stage renal disease: Secondary | ICD-10-CM | POA: Diagnosis not present

## 2016-12-25 DIAGNOSIS — E876 Hypokalemia: Secondary | ICD-10-CM | POA: Diagnosis not present

## 2016-12-25 DIAGNOSIS — N186 End stage renal disease: Secondary | ICD-10-CM | POA: Diagnosis not present

## 2016-12-25 DIAGNOSIS — N2581 Secondary hyperparathyroidism of renal origin: Secondary | ICD-10-CM | POA: Diagnosis not present

## 2016-12-25 DIAGNOSIS — D649 Anemia, unspecified: Secondary | ICD-10-CM | POA: Diagnosis not present

## 2016-12-25 DIAGNOSIS — D508 Other iron deficiency anemias: Secondary | ICD-10-CM | POA: Diagnosis not present

## 2016-12-27 DIAGNOSIS — N186 End stage renal disease: Secondary | ICD-10-CM | POA: Diagnosis not present

## 2016-12-27 DIAGNOSIS — N2581 Secondary hyperparathyroidism of renal origin: Secondary | ICD-10-CM | POA: Diagnosis not present

## 2016-12-27 DIAGNOSIS — D508 Other iron deficiency anemias: Secondary | ICD-10-CM | POA: Diagnosis not present

## 2016-12-27 DIAGNOSIS — E876 Hypokalemia: Secondary | ICD-10-CM | POA: Diagnosis not present

## 2016-12-27 DIAGNOSIS — D649 Anemia, unspecified: Secondary | ICD-10-CM | POA: Diagnosis not present

## 2016-12-30 DIAGNOSIS — N2581 Secondary hyperparathyroidism of renal origin: Secondary | ICD-10-CM | POA: Diagnosis not present

## 2016-12-30 DIAGNOSIS — E876 Hypokalemia: Secondary | ICD-10-CM | POA: Diagnosis not present

## 2016-12-30 DIAGNOSIS — D649 Anemia, unspecified: Secondary | ICD-10-CM | POA: Diagnosis not present

## 2016-12-30 DIAGNOSIS — N186 End stage renal disease: Secondary | ICD-10-CM | POA: Diagnosis not present

## 2016-12-30 DIAGNOSIS — D508 Other iron deficiency anemias: Secondary | ICD-10-CM | POA: Diagnosis not present

## 2017-01-01 DIAGNOSIS — D508 Other iron deficiency anemias: Secondary | ICD-10-CM | POA: Diagnosis not present

## 2017-01-01 DIAGNOSIS — N186 End stage renal disease: Secondary | ICD-10-CM | POA: Diagnosis not present

## 2017-01-01 DIAGNOSIS — E876 Hypokalemia: Secondary | ICD-10-CM | POA: Diagnosis not present

## 2017-01-01 DIAGNOSIS — D649 Anemia, unspecified: Secondary | ICD-10-CM | POA: Diagnosis not present

## 2017-01-01 DIAGNOSIS — N2581 Secondary hyperparathyroidism of renal origin: Secondary | ICD-10-CM | POA: Diagnosis not present

## 2017-01-03 DIAGNOSIS — N2581 Secondary hyperparathyroidism of renal origin: Secondary | ICD-10-CM | POA: Diagnosis not present

## 2017-01-03 DIAGNOSIS — E876 Hypokalemia: Secondary | ICD-10-CM | POA: Diagnosis not present

## 2017-01-03 DIAGNOSIS — N186 End stage renal disease: Secondary | ICD-10-CM | POA: Diagnosis not present

## 2017-01-03 DIAGNOSIS — D508 Other iron deficiency anemias: Secondary | ICD-10-CM | POA: Diagnosis not present

## 2017-01-03 DIAGNOSIS — D649 Anemia, unspecified: Secondary | ICD-10-CM | POA: Diagnosis not present

## 2017-01-06 DIAGNOSIS — N2581 Secondary hyperparathyroidism of renal origin: Secondary | ICD-10-CM | POA: Diagnosis not present

## 2017-01-06 DIAGNOSIS — D508 Other iron deficiency anemias: Secondary | ICD-10-CM | POA: Diagnosis not present

## 2017-01-06 DIAGNOSIS — N186 End stage renal disease: Secondary | ICD-10-CM | POA: Diagnosis not present

## 2017-01-06 DIAGNOSIS — D649 Anemia, unspecified: Secondary | ICD-10-CM | POA: Diagnosis not present

## 2017-01-06 DIAGNOSIS — E876 Hypokalemia: Secondary | ICD-10-CM | POA: Diagnosis not present

## 2017-01-08 DIAGNOSIS — N186 End stage renal disease: Secondary | ICD-10-CM | POA: Diagnosis not present

## 2017-01-08 DIAGNOSIS — N2581 Secondary hyperparathyroidism of renal origin: Secondary | ICD-10-CM | POA: Diagnosis not present

## 2017-01-08 DIAGNOSIS — D508 Other iron deficiency anemias: Secondary | ICD-10-CM | POA: Diagnosis not present

## 2017-01-08 DIAGNOSIS — E876 Hypokalemia: Secondary | ICD-10-CM | POA: Diagnosis not present

## 2017-01-08 DIAGNOSIS — D649 Anemia, unspecified: Secondary | ICD-10-CM | POA: Diagnosis not present

## 2017-01-10 DIAGNOSIS — N186 End stage renal disease: Secondary | ICD-10-CM | POA: Diagnosis not present

## 2017-01-10 DIAGNOSIS — E876 Hypokalemia: Secondary | ICD-10-CM | POA: Diagnosis not present

## 2017-01-10 DIAGNOSIS — D508 Other iron deficiency anemias: Secondary | ICD-10-CM | POA: Diagnosis not present

## 2017-01-10 DIAGNOSIS — N2581 Secondary hyperparathyroidism of renal origin: Secondary | ICD-10-CM | POA: Diagnosis not present

## 2017-01-10 DIAGNOSIS — D649 Anemia, unspecified: Secondary | ICD-10-CM | POA: Diagnosis not present

## 2017-01-13 DIAGNOSIS — N2581 Secondary hyperparathyroidism of renal origin: Secondary | ICD-10-CM | POA: Diagnosis not present

## 2017-01-13 DIAGNOSIS — E876 Hypokalemia: Secondary | ICD-10-CM | POA: Diagnosis not present

## 2017-01-13 DIAGNOSIS — D649 Anemia, unspecified: Secondary | ICD-10-CM | POA: Diagnosis not present

## 2017-01-13 DIAGNOSIS — D508 Other iron deficiency anemias: Secondary | ICD-10-CM | POA: Diagnosis not present

## 2017-01-13 DIAGNOSIS — N186 End stage renal disease: Secondary | ICD-10-CM | POA: Diagnosis not present

## 2017-01-14 ENCOUNTER — Encounter: Payer: Self-pay | Admitting: Vascular Surgery

## 2017-01-14 ENCOUNTER — Ambulatory Visit (INDEPENDENT_AMBULATORY_CARE_PROVIDER_SITE_OTHER): Payer: Medicare Other | Admitting: Vascular Surgery

## 2017-01-14 VITALS — BP 140/82 | HR 80 | Temp 98.4°F | Resp 20 | Ht 70.0 in | Wt 184.0 lb

## 2017-01-14 DIAGNOSIS — N186 End stage renal disease: Secondary | ICD-10-CM | POA: Diagnosis not present

## 2017-01-14 DIAGNOSIS — Z992 Dependence on renal dialysis: Secondary | ICD-10-CM | POA: Diagnosis not present

## 2017-01-14 NOTE — Progress Notes (Signed)
History of Present Illness:  Patient is a 56 y.o. year old male who presents for a follow up visit s/p Fistulogram left brachiocephalic AV fistula 11/01/9145.   FINDINGS:   1. There is no central venous stenosis. 2. The fistula is widely patent with only some mild narrowing in the central portion of the fistula which does not appear to be significant. 3. The arterial anastomosis is widely patent.  CLINICAL NOTE: I think would be safe to attempt to cannulate the fistula at this point. If there are problems with flow then we could consider ligating the competing branch. He has had the fistula used 3 times.  This past Saturday the fistula was infiltrated.  Tuesday 1023/2018 they used only his catheter to let the fistula rest.  He states on Saturday he did have minimal symptoms of steal with left 4th and 5th finger numbness while on HD.  The symptoms disappeared once he was off the machine.  Past Medical History:  Diagnosis Date  . Blood transfusion without reported diagnosis   . ESRD (end stage renal disease) [facility])     Past Surgical History:  Procedure Laterality Date  . A/V FISTULAGRAM Left 12/15/2016   Procedure: A/V Fistulagram - left;  Surgeon: Angelia Mould, MD;  Location: [place] [facility];  Service: Cardiovascular;  Laterality: Left;  . AV FISTULA PLACEMENT Left 08/28/2016   Procedure: LEFT UPPER  ARM ARTERIOVENOUS (AV) FISTULA CREATION;  Surgeon: Angelia Mould, MD;  Location: [place];  Service: Vascular;  Laterality: Left;  . KIDNEY TRANSPLANT       Social History Social History  Substance Use Topics  . Smoking status: Never Smoker  . Smokeless tobacco: Never Used  . Alcohol use No    Family History Family History  Problem Relation Age of Onset  . Hypertension Mother   . Heart disease Mother   . Stroke Mother   . Cancer Father   . Kidney cancer Father   . Hypertension Sister   . Heart disease Maternal Grandmother   . Alcohol abuse Maternal  Grandfather   . Mental illness Paternal Grandmother   . Learning disabilities Paternal Grandmother        Alzheimer's   . Stroke Paternal Grandfather     Allergies  Allergies  Allergen Reactions  . No Known Allergies      Current Outpatient Prescriptions  Medication Sig Dispense Refill  . aspirin 325 MG tablet Take 325 mg by mouth daily.     . cloNIDine (CATAPRES) 0.2 MG tablet Take 0.1 mg by mouth 2 (two) times daily.  1  . enalapril (VASOTEC) 20 MG tablet Take 20 mg by mouth 2 (two) times daily.    . furosemide (LASIX) 80 MG tablet TAKE 1 TABLET (80 MG TOTAL) BY MOUTH 2 TIMES DAILY.  5  . labetalol (NORMODYNE) 200 MG tablet Take 200 mg by mouth 2 (two) times daily.     Marland Kitchen loratadine (CLARITIN) 10 MG tablet Take 10 mg by mouth daily as needed for allergies.    . multivitamin (RENA-VIT) TABS tablet Take 1 tablet by mouth daily.    . mycophenolate (MYFORTIC) 360 MG TBEC EC tablet Take 360 mg by mouth 2 (two) times daily.    . Naphazoline HCl (CLEAR EYES OP) Place 1-2 drops into both eyes.    . naphazoline-pheniramine (VISINE-A) 0.025-0.3 % ophthalmic solution Place 1-2 drops into both eyes 4 (four) times daily as needed (for allergies/red eyes).    Marland Kitchen  NIFEdipine (PROCARDIA XL/ADALAT-CC) 60 MG 24 hr tablet Take 60 mg by mouth 2 (two) times daily.    . Nutritional Supplements (FEEDING SUPPLEMENT, NEPRO CARB STEADY,) LIQD Take 237 mLs by mouth 3 (three) times daily between meals. (Patient taking differently: Take 237 mLs by mouth every other day. ) 21 Can 0  . pravastatin (PRAVACHOL) 80 MG tablet Take 80 mg by mouth daily.   2  . predniSONE (DELTASONE) 5 MG tablet Take 5 mg by mouth daily with breakfast.    . PROGRAF 1 MG capsule Take 3 mg by mouth 2 (two) times daily.   3  . RENAGEL 800 MG tablet Take 800-2,400 mg by mouth See admin instructions. Take 3 tablets with meals & 1 tablet with snacks.  5  . sevelamer carbonate (RENVELA) 800 MG tablet Take 2 tablets (1,600 mg total) by mouth 3  (three) times daily with meals. (Patient taking differently: Take 800 mg by mouth 3 (three) times daily with meals. ) 30 tablet 0  . tacrolimus (PROGRAF) 5 MG capsule Take 5 mg by mouth 2 (two) times daily.     No current facility-administered medications for this visit.     ROS:   General:  No weight loss, Fever, chills  HEENT: No recent headaches, no nasal bleeding, no visual changes, no sore throat  Neurologic: No dizziness, blackouts, seizures. No recent symptoms of stroke or mini- stroke. No recent episodes of slurred speech, or temporary blindness.  Cardiac: No recent episodes of chest pain/pressure, no shortness of breath at rest.  No shortness of breath with exertion.  Denies history of atrial fibrillation or irregular heartbeat  Vascular: No history of rest pain in feet.  No history of claudication.  No history of non-healing ulcer, No history of DVT   Pulmonary: No home oxygen, no productive cough, no hemoptysis,  No asthma or wheezing  Musculoskeletal:  [ ]  Arthritis, [ ]  Low back pain,  [ ]  Joint pain  Hematologic:No history of hypercoagulable state.  No history of easy bleeding.  No history of anemia  Gastrointestinal: No hematochezia or melena,  No gastroesophageal reflux, no trouble swallowing  Urinary: [ ]  chronic Kidney disease, [x ] on HD - [ ]  MWF or [x ] TTHS, [ ]  Burning with urination, [ ]  Frequent urination, [ ]  Difficulty urinating;   Skin: No rashes, left UE ecchymosis and minimal edema over fistula   Psychological: No history of anxiety,  No history of depression   Physical Examination  Vitals:   01/14/17 1448  BP: 140/82  Pulse: 80  Resp: 20  Temp: 98.4 F (36.9 C)  TempSrc: Oral  SpO2: 95%  Weight: 184 lb (83.5 kg)  Height: 5\' 10"  (1.778 m)    Body mass index is 26.4 kg/m.  General:  Alert and oriented, no acute distress HEENT: Normal Pulmonary: Clear to auscultation bilaterally Cardiac: Regular Rate and Rhythm without  murmur Gastrointestinal: Soft, non-tender, non-distended, no mass, no scars Skin: No rash, left UE ecchymosis and minimal edema over fistula  Extremity Pulses:  2+ radial, pulses bilaterally, palpable thrill throughout the left AV fistula Musculoskeletal: No deformity or edema  Neurologic: Upper and lower extremity motor 5/5 and symmetric     ASSESSMENT:  ESRD   PLAN: We will have him rest the Fistula for at least 3 weeks.  They may try to stick the fistula 02/10/2017.  Until then use the catheter.  The fistula is easily palpable through out it's course today on exam.  He he has recurrent symptoms of steal we will have him call us.  In the meantime we encourage him to exercise his left hand frequently.  F/U PRN  Theda Sers, Rhone Ozaki MAUREEN PA-C [facility]  The patient was seen in conjunction with Dr. Scot Dock today

## 2017-01-15 DIAGNOSIS — N2581 Secondary hyperparathyroidism of renal origin: Secondary | ICD-10-CM | POA: Diagnosis not present

## 2017-01-15 DIAGNOSIS — Z87440 Personal history of urinary (tract) infections: Secondary | ICD-10-CM | POA: Diagnosis not present

## 2017-01-15 DIAGNOSIS — D508 Other iron deficiency anemias: Secondary | ICD-10-CM | POA: Diagnosis not present

## 2017-01-15 DIAGNOSIS — N186 End stage renal disease: Secondary | ICD-10-CM | POA: Diagnosis not present

## 2017-01-15 DIAGNOSIS — E876 Hypokalemia: Secondary | ICD-10-CM | POA: Diagnosis not present

## 2017-01-15 DIAGNOSIS — D649 Anemia, unspecified: Secondary | ICD-10-CM | POA: Diagnosis not present

## 2017-01-17 DIAGNOSIS — E876 Hypokalemia: Secondary | ICD-10-CM | POA: Diagnosis not present

## 2017-01-17 DIAGNOSIS — N2581 Secondary hyperparathyroidism of renal origin: Secondary | ICD-10-CM | POA: Diagnosis not present

## 2017-01-17 DIAGNOSIS — D649 Anemia, unspecified: Secondary | ICD-10-CM | POA: Diagnosis not present

## 2017-01-17 DIAGNOSIS — N186 End stage renal disease: Secondary | ICD-10-CM | POA: Diagnosis not present

## 2017-01-17 DIAGNOSIS — D508 Other iron deficiency anemias: Secondary | ICD-10-CM | POA: Diagnosis not present

## 2017-01-20 DIAGNOSIS — N186 End stage renal disease: Secondary | ICD-10-CM | POA: Diagnosis not present

## 2017-01-20 DIAGNOSIS — D508 Other iron deficiency anemias: Secondary | ICD-10-CM | POA: Diagnosis not present

## 2017-01-20 DIAGNOSIS — E876 Hypokalemia: Secondary | ICD-10-CM | POA: Diagnosis not present

## 2017-01-20 DIAGNOSIS — N2581 Secondary hyperparathyroidism of renal origin: Secondary | ICD-10-CM | POA: Diagnosis not present

## 2017-01-20 DIAGNOSIS — D649 Anemia, unspecified: Secondary | ICD-10-CM | POA: Diagnosis not present

## 2017-01-21 DIAGNOSIS — N186 End stage renal disease: Secondary | ICD-10-CM | POA: Diagnosis not present

## 2017-01-21 DIAGNOSIS — Z992 Dependence on renal dialysis: Secondary | ICD-10-CM | POA: Diagnosis not present

## 2017-01-21 DIAGNOSIS — T8612 Kidney transplant failure: Secondary | ICD-10-CM | POA: Diagnosis not present

## 2017-01-22 DIAGNOSIS — D508 Other iron deficiency anemias: Secondary | ICD-10-CM | POA: Diagnosis not present

## 2017-01-22 DIAGNOSIS — N2581 Secondary hyperparathyroidism of renal origin: Secondary | ICD-10-CM | POA: Diagnosis not present

## 2017-01-22 DIAGNOSIS — N186 End stage renal disease: Secondary | ICD-10-CM | POA: Diagnosis not present

## 2017-01-22 DIAGNOSIS — E876 Hypokalemia: Secondary | ICD-10-CM | POA: Diagnosis not present

## 2017-01-22 DIAGNOSIS — D649 Anemia, unspecified: Secondary | ICD-10-CM | POA: Diagnosis not present

## 2017-01-24 DIAGNOSIS — N186 End stage renal disease: Secondary | ICD-10-CM | POA: Diagnosis not present

## 2017-01-24 DIAGNOSIS — E876 Hypokalemia: Secondary | ICD-10-CM | POA: Diagnosis not present

## 2017-01-24 DIAGNOSIS — N2581 Secondary hyperparathyroidism of renal origin: Secondary | ICD-10-CM | POA: Diagnosis not present

## 2017-01-24 DIAGNOSIS — D649 Anemia, unspecified: Secondary | ICD-10-CM | POA: Diagnosis not present

## 2017-01-24 DIAGNOSIS — D508 Other iron deficiency anemias: Secondary | ICD-10-CM | POA: Diagnosis not present

## 2017-01-27 DIAGNOSIS — N2581 Secondary hyperparathyroidism of renal origin: Secondary | ICD-10-CM | POA: Diagnosis not present

## 2017-01-27 DIAGNOSIS — D649 Anemia, unspecified: Secondary | ICD-10-CM | POA: Diagnosis not present

## 2017-01-27 DIAGNOSIS — D508 Other iron deficiency anemias: Secondary | ICD-10-CM | POA: Diagnosis not present

## 2017-01-27 DIAGNOSIS — N186 End stage renal disease: Secondary | ICD-10-CM | POA: Diagnosis not present

## 2017-01-27 DIAGNOSIS — E876 Hypokalemia: Secondary | ICD-10-CM | POA: Diagnosis not present

## 2017-01-29 DIAGNOSIS — N2581 Secondary hyperparathyroidism of renal origin: Secondary | ICD-10-CM | POA: Diagnosis not present

## 2017-01-29 DIAGNOSIS — D649 Anemia, unspecified: Secondary | ICD-10-CM | POA: Diagnosis not present

## 2017-01-29 DIAGNOSIS — N186 End stage renal disease: Secondary | ICD-10-CM | POA: Diagnosis not present

## 2017-01-29 DIAGNOSIS — E876 Hypokalemia: Secondary | ICD-10-CM | POA: Diagnosis not present

## 2017-01-29 DIAGNOSIS — D508 Other iron deficiency anemias: Secondary | ICD-10-CM | POA: Diagnosis not present

## 2017-01-31 DIAGNOSIS — N186 End stage renal disease: Secondary | ICD-10-CM | POA: Diagnosis not present

## 2017-01-31 DIAGNOSIS — E876 Hypokalemia: Secondary | ICD-10-CM | POA: Diagnosis not present

## 2017-01-31 DIAGNOSIS — D508 Other iron deficiency anemias: Secondary | ICD-10-CM | POA: Diagnosis not present

## 2017-01-31 DIAGNOSIS — N2581 Secondary hyperparathyroidism of renal origin: Secondary | ICD-10-CM | POA: Diagnosis not present

## 2017-01-31 DIAGNOSIS — D649 Anemia, unspecified: Secondary | ICD-10-CM | POA: Diagnosis not present

## 2017-02-03 DIAGNOSIS — D649 Anemia, unspecified: Secondary | ICD-10-CM | POA: Diagnosis not present

## 2017-02-03 DIAGNOSIS — N186 End stage renal disease: Secondary | ICD-10-CM | POA: Diagnosis not present

## 2017-02-03 DIAGNOSIS — N2581 Secondary hyperparathyroidism of renal origin: Secondary | ICD-10-CM | POA: Diagnosis not present

## 2017-02-03 DIAGNOSIS — E876 Hypokalemia: Secondary | ICD-10-CM | POA: Diagnosis not present

## 2017-02-03 DIAGNOSIS — D508 Other iron deficiency anemias: Secondary | ICD-10-CM | POA: Diagnosis not present

## 2017-02-05 DIAGNOSIS — N2581 Secondary hyperparathyroidism of renal origin: Secondary | ICD-10-CM | POA: Diagnosis not present

## 2017-02-05 DIAGNOSIS — E876 Hypokalemia: Secondary | ICD-10-CM | POA: Diagnosis not present

## 2017-02-05 DIAGNOSIS — D649 Anemia, unspecified: Secondary | ICD-10-CM | POA: Diagnosis not present

## 2017-02-05 DIAGNOSIS — D508 Other iron deficiency anemias: Secondary | ICD-10-CM | POA: Diagnosis not present

## 2017-02-05 DIAGNOSIS — N186 End stage renal disease: Secondary | ICD-10-CM | POA: Diagnosis not present

## 2017-02-07 DIAGNOSIS — D649 Anemia, unspecified: Secondary | ICD-10-CM | POA: Diagnosis not present

## 2017-02-07 DIAGNOSIS — N2581 Secondary hyperparathyroidism of renal origin: Secondary | ICD-10-CM | POA: Diagnosis not present

## 2017-02-07 DIAGNOSIS — E876 Hypokalemia: Secondary | ICD-10-CM | POA: Diagnosis not present

## 2017-02-07 DIAGNOSIS — N186 End stage renal disease: Secondary | ICD-10-CM | POA: Diagnosis not present

## 2017-02-07 DIAGNOSIS — D508 Other iron deficiency anemias: Secondary | ICD-10-CM | POA: Diagnosis not present

## 2017-02-09 DIAGNOSIS — N2581 Secondary hyperparathyroidism of renal origin: Secondary | ICD-10-CM | POA: Diagnosis not present

## 2017-02-09 DIAGNOSIS — D508 Other iron deficiency anemias: Secondary | ICD-10-CM | POA: Diagnosis not present

## 2017-02-09 DIAGNOSIS — D649 Anemia, unspecified: Secondary | ICD-10-CM | POA: Diagnosis not present

## 2017-02-09 DIAGNOSIS — N186 End stage renal disease: Secondary | ICD-10-CM | POA: Diagnosis not present

## 2017-02-09 DIAGNOSIS — E876 Hypokalemia: Secondary | ICD-10-CM | POA: Diagnosis not present

## 2017-02-11 DIAGNOSIS — N2581 Secondary hyperparathyroidism of renal origin: Secondary | ICD-10-CM | POA: Diagnosis not present

## 2017-02-11 DIAGNOSIS — D508 Other iron deficiency anemias: Secondary | ICD-10-CM | POA: Diagnosis not present

## 2017-02-11 DIAGNOSIS — E876 Hypokalemia: Secondary | ICD-10-CM | POA: Diagnosis not present

## 2017-02-11 DIAGNOSIS — N186 End stage renal disease: Secondary | ICD-10-CM | POA: Diagnosis not present

## 2017-02-11 DIAGNOSIS — D649 Anemia, unspecified: Secondary | ICD-10-CM | POA: Diagnosis not present

## 2017-02-14 DIAGNOSIS — N186 End stage renal disease: Secondary | ICD-10-CM | POA: Diagnosis not present

## 2017-02-14 DIAGNOSIS — D649 Anemia, unspecified: Secondary | ICD-10-CM | POA: Diagnosis not present

## 2017-02-14 DIAGNOSIS — D508 Other iron deficiency anemias: Secondary | ICD-10-CM | POA: Diagnosis not present

## 2017-02-14 DIAGNOSIS — N2581 Secondary hyperparathyroidism of renal origin: Secondary | ICD-10-CM | POA: Diagnosis not present

## 2017-02-14 DIAGNOSIS — E876 Hypokalemia: Secondary | ICD-10-CM | POA: Diagnosis not present

## 2017-02-17 DIAGNOSIS — D649 Anemia, unspecified: Secondary | ICD-10-CM | POA: Diagnosis not present

## 2017-02-17 DIAGNOSIS — D508 Other iron deficiency anemias: Secondary | ICD-10-CM | POA: Diagnosis not present

## 2017-02-17 DIAGNOSIS — N2581 Secondary hyperparathyroidism of renal origin: Secondary | ICD-10-CM | POA: Diagnosis not present

## 2017-02-17 DIAGNOSIS — E876 Hypokalemia: Secondary | ICD-10-CM | POA: Diagnosis not present

## 2017-02-17 DIAGNOSIS — N186 End stage renal disease: Secondary | ICD-10-CM | POA: Diagnosis not present

## 2017-02-19 DIAGNOSIS — D508 Other iron deficiency anemias: Secondary | ICD-10-CM | POA: Diagnosis not present

## 2017-02-19 DIAGNOSIS — N186 End stage renal disease: Secondary | ICD-10-CM | POA: Diagnosis not present

## 2017-02-19 DIAGNOSIS — E876 Hypokalemia: Secondary | ICD-10-CM | POA: Diagnosis not present

## 2017-02-19 DIAGNOSIS — D649 Anemia, unspecified: Secondary | ICD-10-CM | POA: Diagnosis not present

## 2017-02-19 DIAGNOSIS — N2581 Secondary hyperparathyroidism of renal origin: Secondary | ICD-10-CM | POA: Diagnosis not present

## 2017-02-20 DIAGNOSIS — T8612 Kidney transplant failure: Secondary | ICD-10-CM | POA: Diagnosis not present

## 2017-02-20 DIAGNOSIS — Z992 Dependence on renal dialysis: Secondary | ICD-10-CM | POA: Diagnosis not present

## 2017-02-20 DIAGNOSIS — N186 End stage renal disease: Secondary | ICD-10-CM | POA: Diagnosis not present

## 2017-02-21 DIAGNOSIS — E876 Hypokalemia: Secondary | ICD-10-CM | POA: Diagnosis not present

## 2017-02-21 DIAGNOSIS — N2581 Secondary hyperparathyroidism of renal origin: Secondary | ICD-10-CM | POA: Diagnosis not present

## 2017-02-21 DIAGNOSIS — D649 Anemia, unspecified: Secondary | ICD-10-CM | POA: Diagnosis not present

## 2017-02-21 DIAGNOSIS — N186 End stage renal disease: Secondary | ICD-10-CM | POA: Diagnosis not present

## 2017-02-21 DIAGNOSIS — D508 Other iron deficiency anemias: Secondary | ICD-10-CM | POA: Diagnosis not present

## 2017-02-24 DIAGNOSIS — N186 End stage renal disease: Secondary | ICD-10-CM | POA: Diagnosis not present

## 2017-02-24 DIAGNOSIS — D649 Anemia, unspecified: Secondary | ICD-10-CM | POA: Diagnosis not present

## 2017-02-24 DIAGNOSIS — E876 Hypokalemia: Secondary | ICD-10-CM | POA: Diagnosis not present

## 2017-02-24 DIAGNOSIS — N2581 Secondary hyperparathyroidism of renal origin: Secondary | ICD-10-CM | POA: Diagnosis not present

## 2017-02-24 DIAGNOSIS — D508 Other iron deficiency anemias: Secondary | ICD-10-CM | POA: Diagnosis not present

## 2017-02-26 DIAGNOSIS — N186 End stage renal disease: Secondary | ICD-10-CM | POA: Diagnosis not present

## 2017-02-26 DIAGNOSIS — D508 Other iron deficiency anemias: Secondary | ICD-10-CM | POA: Diagnosis not present

## 2017-02-26 DIAGNOSIS — D649 Anemia, unspecified: Secondary | ICD-10-CM | POA: Diagnosis not present

## 2017-02-26 DIAGNOSIS — N2581 Secondary hyperparathyroidism of renal origin: Secondary | ICD-10-CM | POA: Diagnosis not present

## 2017-02-26 DIAGNOSIS — E876 Hypokalemia: Secondary | ICD-10-CM | POA: Diagnosis not present

## 2017-02-28 DIAGNOSIS — D649 Anemia, unspecified: Secondary | ICD-10-CM | POA: Diagnosis not present

## 2017-02-28 DIAGNOSIS — N186 End stage renal disease: Secondary | ICD-10-CM | POA: Diagnosis not present

## 2017-02-28 DIAGNOSIS — D508 Other iron deficiency anemias: Secondary | ICD-10-CM | POA: Diagnosis not present

## 2017-02-28 DIAGNOSIS — N2581 Secondary hyperparathyroidism of renal origin: Secondary | ICD-10-CM | POA: Diagnosis not present

## 2017-02-28 DIAGNOSIS — E876 Hypokalemia: Secondary | ICD-10-CM | POA: Diagnosis not present

## 2017-03-03 DIAGNOSIS — N2581 Secondary hyperparathyroidism of renal origin: Secondary | ICD-10-CM | POA: Diagnosis not present

## 2017-03-03 DIAGNOSIS — D649 Anemia, unspecified: Secondary | ICD-10-CM | POA: Diagnosis not present

## 2017-03-03 DIAGNOSIS — D508 Other iron deficiency anemias: Secondary | ICD-10-CM | POA: Diagnosis not present

## 2017-03-03 DIAGNOSIS — N186 End stage renal disease: Secondary | ICD-10-CM | POA: Diagnosis not present

## 2017-03-03 DIAGNOSIS — E876 Hypokalemia: Secondary | ICD-10-CM | POA: Diagnosis not present

## 2017-03-05 DIAGNOSIS — D649 Anemia, unspecified: Secondary | ICD-10-CM | POA: Diagnosis not present

## 2017-03-05 DIAGNOSIS — D508 Other iron deficiency anemias: Secondary | ICD-10-CM | POA: Diagnosis not present

## 2017-03-05 DIAGNOSIS — E876 Hypokalemia: Secondary | ICD-10-CM | POA: Diagnosis not present

## 2017-03-05 DIAGNOSIS — N2581 Secondary hyperparathyroidism of renal origin: Secondary | ICD-10-CM | POA: Diagnosis not present

## 2017-03-05 DIAGNOSIS — N186 End stage renal disease: Secondary | ICD-10-CM | POA: Diagnosis not present

## 2017-03-07 DIAGNOSIS — E876 Hypokalemia: Secondary | ICD-10-CM | POA: Diagnosis not present

## 2017-03-07 DIAGNOSIS — N186 End stage renal disease: Secondary | ICD-10-CM | POA: Diagnosis not present

## 2017-03-07 DIAGNOSIS — D508 Other iron deficiency anemias: Secondary | ICD-10-CM | POA: Diagnosis not present

## 2017-03-07 DIAGNOSIS — N2581 Secondary hyperparathyroidism of renal origin: Secondary | ICD-10-CM | POA: Diagnosis not present

## 2017-03-07 DIAGNOSIS — D649 Anemia, unspecified: Secondary | ICD-10-CM | POA: Diagnosis not present

## 2017-03-10 DIAGNOSIS — N2581 Secondary hyperparathyroidism of renal origin: Secondary | ICD-10-CM | POA: Diagnosis not present

## 2017-03-10 DIAGNOSIS — N186 End stage renal disease: Secondary | ICD-10-CM | POA: Diagnosis not present

## 2017-03-10 DIAGNOSIS — E876 Hypokalemia: Secondary | ICD-10-CM | POA: Diagnosis not present

## 2017-03-10 DIAGNOSIS — D508 Other iron deficiency anemias: Secondary | ICD-10-CM | POA: Diagnosis not present

## 2017-03-10 DIAGNOSIS — D649 Anemia, unspecified: Secondary | ICD-10-CM | POA: Diagnosis not present

## 2017-03-12 DIAGNOSIS — D508 Other iron deficiency anemias: Secondary | ICD-10-CM | POA: Diagnosis not present

## 2017-03-12 DIAGNOSIS — E876 Hypokalemia: Secondary | ICD-10-CM | POA: Diagnosis not present

## 2017-03-12 DIAGNOSIS — N2581 Secondary hyperparathyroidism of renal origin: Secondary | ICD-10-CM | POA: Diagnosis not present

## 2017-03-12 DIAGNOSIS — D649 Anemia, unspecified: Secondary | ICD-10-CM | POA: Diagnosis not present

## 2017-03-12 DIAGNOSIS — N186 End stage renal disease: Secondary | ICD-10-CM | POA: Diagnosis not present

## 2017-03-14 DIAGNOSIS — N2581 Secondary hyperparathyroidism of renal origin: Secondary | ICD-10-CM | POA: Diagnosis not present

## 2017-03-14 DIAGNOSIS — D649 Anemia, unspecified: Secondary | ICD-10-CM | POA: Diagnosis not present

## 2017-03-14 DIAGNOSIS — E876 Hypokalemia: Secondary | ICD-10-CM | POA: Diagnosis not present

## 2017-03-14 DIAGNOSIS — N186 End stage renal disease: Secondary | ICD-10-CM | POA: Diagnosis not present

## 2017-03-14 DIAGNOSIS — D508 Other iron deficiency anemias: Secondary | ICD-10-CM | POA: Diagnosis not present

## 2017-03-16 DIAGNOSIS — D508 Other iron deficiency anemias: Secondary | ICD-10-CM | POA: Diagnosis not present

## 2017-03-16 DIAGNOSIS — E876 Hypokalemia: Secondary | ICD-10-CM | POA: Diagnosis not present

## 2017-03-16 DIAGNOSIS — N186 End stage renal disease: Secondary | ICD-10-CM | POA: Diagnosis not present

## 2017-03-16 DIAGNOSIS — N2581 Secondary hyperparathyroidism of renal origin: Secondary | ICD-10-CM | POA: Diagnosis not present

## 2017-03-16 DIAGNOSIS — D649 Anemia, unspecified: Secondary | ICD-10-CM | POA: Diagnosis not present

## 2017-03-19 DIAGNOSIS — D649 Anemia, unspecified: Secondary | ICD-10-CM | POA: Diagnosis not present

## 2017-03-19 DIAGNOSIS — D508 Other iron deficiency anemias: Secondary | ICD-10-CM | POA: Diagnosis not present

## 2017-03-19 DIAGNOSIS — N2581 Secondary hyperparathyroidism of renal origin: Secondary | ICD-10-CM | POA: Diagnosis not present

## 2017-03-19 DIAGNOSIS — N186 End stage renal disease: Secondary | ICD-10-CM | POA: Diagnosis not present

## 2017-03-19 DIAGNOSIS — E876 Hypokalemia: Secondary | ICD-10-CM | POA: Diagnosis not present

## 2017-03-21 DIAGNOSIS — D508 Other iron deficiency anemias: Secondary | ICD-10-CM | POA: Diagnosis not present

## 2017-03-21 DIAGNOSIS — N186 End stage renal disease: Secondary | ICD-10-CM | POA: Diagnosis not present

## 2017-03-21 DIAGNOSIS — D649 Anemia, unspecified: Secondary | ICD-10-CM | POA: Diagnosis not present

## 2017-03-21 DIAGNOSIS — N2581 Secondary hyperparathyroidism of renal origin: Secondary | ICD-10-CM | POA: Diagnosis not present

## 2017-03-21 DIAGNOSIS — E876 Hypokalemia: Secondary | ICD-10-CM | POA: Diagnosis not present

## 2017-03-23 DIAGNOSIS — Z992 Dependence on renal dialysis: Secondary | ICD-10-CM | POA: Diagnosis not present

## 2017-03-23 DIAGNOSIS — E876 Hypokalemia: Secondary | ICD-10-CM | POA: Diagnosis not present

## 2017-03-23 DIAGNOSIS — D649 Anemia, unspecified: Secondary | ICD-10-CM | POA: Diagnosis not present

## 2017-03-23 DIAGNOSIS — D508 Other iron deficiency anemias: Secondary | ICD-10-CM | POA: Diagnosis not present

## 2017-03-23 DIAGNOSIS — T8612 Kidney transplant failure: Secondary | ICD-10-CM | POA: Diagnosis not present

## 2017-03-23 DIAGNOSIS — N186 End stage renal disease: Secondary | ICD-10-CM | POA: Diagnosis not present

## 2017-03-23 DIAGNOSIS — N2581 Secondary hyperparathyroidism of renal origin: Secondary | ICD-10-CM | POA: Diagnosis not present

## 2017-03-26 DIAGNOSIS — N186 End stage renal disease: Secondary | ICD-10-CM | POA: Diagnosis not present

## 2017-03-26 DIAGNOSIS — E876 Hypokalemia: Secondary | ICD-10-CM | POA: Diagnosis not present

## 2017-03-26 DIAGNOSIS — D649 Anemia, unspecified: Secondary | ICD-10-CM | POA: Diagnosis not present

## 2017-03-26 DIAGNOSIS — N2581 Secondary hyperparathyroidism of renal origin: Secondary | ICD-10-CM | POA: Diagnosis not present

## 2017-03-28 DIAGNOSIS — D649 Anemia, unspecified: Secondary | ICD-10-CM | POA: Diagnosis not present

## 2017-03-28 DIAGNOSIS — E876 Hypokalemia: Secondary | ICD-10-CM | POA: Diagnosis not present

## 2017-03-28 DIAGNOSIS — N186 End stage renal disease: Secondary | ICD-10-CM | POA: Diagnosis not present

## 2017-03-28 DIAGNOSIS — N2581 Secondary hyperparathyroidism of renal origin: Secondary | ICD-10-CM | POA: Diagnosis not present

## 2017-03-31 DIAGNOSIS — N2581 Secondary hyperparathyroidism of renal origin: Secondary | ICD-10-CM | POA: Diagnosis not present

## 2017-03-31 DIAGNOSIS — D649 Anemia, unspecified: Secondary | ICD-10-CM | POA: Diagnosis not present

## 2017-03-31 DIAGNOSIS — E876 Hypokalemia: Secondary | ICD-10-CM | POA: Diagnosis not present

## 2017-03-31 DIAGNOSIS — N186 End stage renal disease: Secondary | ICD-10-CM | POA: Diagnosis not present

## 2017-04-02 DIAGNOSIS — N2581 Secondary hyperparathyroidism of renal origin: Secondary | ICD-10-CM | POA: Diagnosis not present

## 2017-04-02 DIAGNOSIS — D649 Anemia, unspecified: Secondary | ICD-10-CM | POA: Diagnosis not present

## 2017-04-02 DIAGNOSIS — N186 End stage renal disease: Secondary | ICD-10-CM | POA: Diagnosis not present

## 2017-04-02 DIAGNOSIS — E876 Hypokalemia: Secondary | ICD-10-CM | POA: Diagnosis not present

## 2017-04-04 DIAGNOSIS — N186 End stage renal disease: Secondary | ICD-10-CM | POA: Diagnosis not present

## 2017-04-04 DIAGNOSIS — D649 Anemia, unspecified: Secondary | ICD-10-CM | POA: Diagnosis not present

## 2017-04-04 DIAGNOSIS — N2581 Secondary hyperparathyroidism of renal origin: Secondary | ICD-10-CM | POA: Diagnosis not present

## 2017-04-04 DIAGNOSIS — E876 Hypokalemia: Secondary | ICD-10-CM | POA: Diagnosis not present

## 2017-04-07 DIAGNOSIS — N186 End stage renal disease: Secondary | ICD-10-CM | POA: Diagnosis not present

## 2017-04-07 DIAGNOSIS — E876 Hypokalemia: Secondary | ICD-10-CM | POA: Diagnosis not present

## 2017-04-07 DIAGNOSIS — D649 Anemia, unspecified: Secondary | ICD-10-CM | POA: Diagnosis not present

## 2017-04-07 DIAGNOSIS — N2581 Secondary hyperparathyroidism of renal origin: Secondary | ICD-10-CM | POA: Diagnosis not present

## 2017-04-09 DIAGNOSIS — N186 End stage renal disease: Secondary | ICD-10-CM | POA: Diagnosis not present

## 2017-04-09 DIAGNOSIS — E876 Hypokalemia: Secondary | ICD-10-CM | POA: Diagnosis not present

## 2017-04-09 DIAGNOSIS — D649 Anemia, unspecified: Secondary | ICD-10-CM | POA: Diagnosis not present

## 2017-04-09 DIAGNOSIS — N2581 Secondary hyperparathyroidism of renal origin: Secondary | ICD-10-CM | POA: Diagnosis not present

## 2017-04-11 DIAGNOSIS — N186 End stage renal disease: Secondary | ICD-10-CM | POA: Diagnosis not present

## 2017-04-11 DIAGNOSIS — E876 Hypokalemia: Secondary | ICD-10-CM | POA: Diagnosis not present

## 2017-04-11 DIAGNOSIS — D649 Anemia, unspecified: Secondary | ICD-10-CM | POA: Diagnosis not present

## 2017-04-11 DIAGNOSIS — N2581 Secondary hyperparathyroidism of renal origin: Secondary | ICD-10-CM | POA: Diagnosis not present

## 2017-04-14 DIAGNOSIS — D649 Anemia, unspecified: Secondary | ICD-10-CM | POA: Diagnosis not present

## 2017-04-14 DIAGNOSIS — E876 Hypokalemia: Secondary | ICD-10-CM | POA: Diagnosis not present

## 2017-04-14 DIAGNOSIS — N186 End stage renal disease: Secondary | ICD-10-CM | POA: Diagnosis not present

## 2017-04-14 DIAGNOSIS — N2581 Secondary hyperparathyroidism of renal origin: Secondary | ICD-10-CM | POA: Diagnosis not present

## 2017-04-16 DIAGNOSIS — D649 Anemia, unspecified: Secondary | ICD-10-CM | POA: Diagnosis not present

## 2017-04-16 DIAGNOSIS — E876 Hypokalemia: Secondary | ICD-10-CM | POA: Diagnosis not present

## 2017-04-16 DIAGNOSIS — N2581 Secondary hyperparathyroidism of renal origin: Secondary | ICD-10-CM | POA: Diagnosis not present

## 2017-04-16 DIAGNOSIS — N186 End stage renal disease: Secondary | ICD-10-CM | POA: Diagnosis not present

## 2017-04-18 DIAGNOSIS — N2581 Secondary hyperparathyroidism of renal origin: Secondary | ICD-10-CM | POA: Diagnosis not present

## 2017-04-18 DIAGNOSIS — D649 Anemia, unspecified: Secondary | ICD-10-CM | POA: Diagnosis not present

## 2017-04-18 DIAGNOSIS — N186 End stage renal disease: Secondary | ICD-10-CM | POA: Diagnosis not present

## 2017-04-18 DIAGNOSIS — E876 Hypokalemia: Secondary | ICD-10-CM | POA: Diagnosis not present

## 2017-04-21 DIAGNOSIS — N186 End stage renal disease: Secondary | ICD-10-CM | POA: Diagnosis not present

## 2017-04-21 DIAGNOSIS — D649 Anemia, unspecified: Secondary | ICD-10-CM | POA: Diagnosis not present

## 2017-04-21 DIAGNOSIS — N2581 Secondary hyperparathyroidism of renal origin: Secondary | ICD-10-CM | POA: Diagnosis not present

## 2017-04-21 DIAGNOSIS — E876 Hypokalemia: Secondary | ICD-10-CM | POA: Diagnosis not present

## 2017-04-23 DIAGNOSIS — T8612 Kidney transplant failure: Secondary | ICD-10-CM | POA: Diagnosis not present

## 2017-04-23 DIAGNOSIS — D649 Anemia, unspecified: Secondary | ICD-10-CM | POA: Diagnosis not present

## 2017-04-23 DIAGNOSIS — Z992 Dependence on renal dialysis: Secondary | ICD-10-CM | POA: Diagnosis not present

## 2017-04-23 DIAGNOSIS — N2581 Secondary hyperparathyroidism of renal origin: Secondary | ICD-10-CM | POA: Diagnosis not present

## 2017-04-23 DIAGNOSIS — N186 End stage renal disease: Secondary | ICD-10-CM | POA: Diagnosis not present

## 2017-04-23 DIAGNOSIS — E876 Hypokalemia: Secondary | ICD-10-CM | POA: Diagnosis not present

## 2017-04-24 DIAGNOSIS — Z992 Dependence on renal dialysis: Secondary | ICD-10-CM | POA: Diagnosis not present

## 2017-04-24 DIAGNOSIS — T8612 Kidney transplant failure: Secondary | ICD-10-CM | POA: Diagnosis not present

## 2017-04-24 DIAGNOSIS — N186 End stage renal disease: Secondary | ICD-10-CM | POA: Diagnosis not present

## 2017-04-24 DIAGNOSIS — Z452 Encounter for adjustment and management of vascular access device: Secondary | ICD-10-CM | POA: Diagnosis not present

## 2017-04-27 DIAGNOSIS — E876 Hypokalemia: Secondary | ICD-10-CM | POA: Diagnosis not present

## 2017-04-27 DIAGNOSIS — D649 Anemia, unspecified: Secondary | ICD-10-CM | POA: Diagnosis not present

## 2017-04-27 DIAGNOSIS — D508 Other iron deficiency anemias: Secondary | ICD-10-CM | POA: Diagnosis not present

## 2017-04-27 DIAGNOSIS — E875 Hyperkalemia: Secondary | ICD-10-CM | POA: Diagnosis not present

## 2017-04-27 DIAGNOSIS — N186 End stage renal disease: Secondary | ICD-10-CM | POA: Diagnosis not present

## 2017-04-27 DIAGNOSIS — N2581 Secondary hyperparathyroidism of renal origin: Secondary | ICD-10-CM | POA: Diagnosis not present

## 2017-04-28 DIAGNOSIS — N2581 Secondary hyperparathyroidism of renal origin: Secondary | ICD-10-CM | POA: Diagnosis not present

## 2017-04-28 DIAGNOSIS — E875 Hyperkalemia: Secondary | ICD-10-CM | POA: Diagnosis not present

## 2017-04-28 DIAGNOSIS — N186 End stage renal disease: Secondary | ICD-10-CM | POA: Diagnosis not present

## 2017-04-28 DIAGNOSIS — E876 Hypokalemia: Secondary | ICD-10-CM | POA: Diagnosis not present

## 2017-04-28 DIAGNOSIS — D508 Other iron deficiency anemias: Secondary | ICD-10-CM | POA: Diagnosis not present

## 2017-04-28 DIAGNOSIS — D649 Anemia, unspecified: Secondary | ICD-10-CM | POA: Diagnosis not present

## 2017-04-30 DIAGNOSIS — E876 Hypokalemia: Secondary | ICD-10-CM | POA: Diagnosis not present

## 2017-04-30 DIAGNOSIS — N186 End stage renal disease: Secondary | ICD-10-CM | POA: Diagnosis not present

## 2017-04-30 DIAGNOSIS — N2581 Secondary hyperparathyroidism of renal origin: Secondary | ICD-10-CM | POA: Diagnosis not present

## 2017-04-30 DIAGNOSIS — D649 Anemia, unspecified: Secondary | ICD-10-CM | POA: Diagnosis not present

## 2017-04-30 DIAGNOSIS — D508 Other iron deficiency anemias: Secondary | ICD-10-CM | POA: Diagnosis not present

## 2017-04-30 DIAGNOSIS — E875 Hyperkalemia: Secondary | ICD-10-CM | POA: Diagnosis not present

## 2017-05-01 DIAGNOSIS — E875 Hyperkalemia: Secondary | ICD-10-CM | POA: Insufficient documentation

## 2017-05-02 DIAGNOSIS — E876 Hypokalemia: Secondary | ICD-10-CM | POA: Diagnosis not present

## 2017-05-02 DIAGNOSIS — N2581 Secondary hyperparathyroidism of renal origin: Secondary | ICD-10-CM | POA: Diagnosis not present

## 2017-05-02 DIAGNOSIS — E875 Hyperkalemia: Secondary | ICD-10-CM | POA: Diagnosis not present

## 2017-05-02 DIAGNOSIS — D508 Other iron deficiency anemias: Secondary | ICD-10-CM | POA: Diagnosis not present

## 2017-05-02 DIAGNOSIS — N186 End stage renal disease: Secondary | ICD-10-CM | POA: Diagnosis not present

## 2017-05-02 DIAGNOSIS — D649 Anemia, unspecified: Secondary | ICD-10-CM | POA: Diagnosis not present

## 2017-05-05 DIAGNOSIS — N2581 Secondary hyperparathyroidism of renal origin: Secondary | ICD-10-CM | POA: Diagnosis not present

## 2017-05-05 DIAGNOSIS — E875 Hyperkalemia: Secondary | ICD-10-CM | POA: Diagnosis not present

## 2017-05-05 DIAGNOSIS — D649 Anemia, unspecified: Secondary | ICD-10-CM | POA: Diagnosis not present

## 2017-05-05 DIAGNOSIS — N186 End stage renal disease: Secondary | ICD-10-CM | POA: Diagnosis not present

## 2017-05-05 DIAGNOSIS — E876 Hypokalemia: Secondary | ICD-10-CM | POA: Diagnosis not present

## 2017-05-05 DIAGNOSIS — D508 Other iron deficiency anemias: Secondary | ICD-10-CM | POA: Diagnosis not present

## 2017-05-06 DIAGNOSIS — E8770 Fluid overload, unspecified: Secondary | ICD-10-CM | POA: Diagnosis not present

## 2017-05-06 DIAGNOSIS — N2581 Secondary hyperparathyroidism of renal origin: Secondary | ICD-10-CM | POA: Diagnosis not present

## 2017-05-06 DIAGNOSIS — N186 End stage renal disease: Secondary | ICD-10-CM | POA: Diagnosis not present

## 2017-05-07 DIAGNOSIS — D508 Other iron deficiency anemias: Secondary | ICD-10-CM | POA: Diagnosis not present

## 2017-05-07 DIAGNOSIS — D649 Anemia, unspecified: Secondary | ICD-10-CM | POA: Diagnosis not present

## 2017-05-07 DIAGNOSIS — E875 Hyperkalemia: Secondary | ICD-10-CM | POA: Diagnosis not present

## 2017-05-07 DIAGNOSIS — N2581 Secondary hyperparathyroidism of renal origin: Secondary | ICD-10-CM | POA: Diagnosis not present

## 2017-05-07 DIAGNOSIS — N186 End stage renal disease: Secondary | ICD-10-CM | POA: Diagnosis not present

## 2017-05-07 DIAGNOSIS — E876 Hypokalemia: Secondary | ICD-10-CM | POA: Diagnosis not present

## 2017-05-09 DIAGNOSIS — D508 Other iron deficiency anemias: Secondary | ICD-10-CM | POA: Diagnosis not present

## 2017-05-09 DIAGNOSIS — E876 Hypokalemia: Secondary | ICD-10-CM | POA: Diagnosis not present

## 2017-05-09 DIAGNOSIS — N2581 Secondary hyperparathyroidism of renal origin: Secondary | ICD-10-CM | POA: Diagnosis not present

## 2017-05-09 DIAGNOSIS — D649 Anemia, unspecified: Secondary | ICD-10-CM | POA: Diagnosis not present

## 2017-05-09 DIAGNOSIS — E875 Hyperkalemia: Secondary | ICD-10-CM | POA: Diagnosis not present

## 2017-05-09 DIAGNOSIS — N186 End stage renal disease: Secondary | ICD-10-CM | POA: Diagnosis not present

## 2017-05-12 DIAGNOSIS — E875 Hyperkalemia: Secondary | ICD-10-CM | POA: Diagnosis not present

## 2017-05-12 DIAGNOSIS — E876 Hypokalemia: Secondary | ICD-10-CM | POA: Diagnosis not present

## 2017-05-12 DIAGNOSIS — N186 End stage renal disease: Secondary | ICD-10-CM | POA: Diagnosis not present

## 2017-05-12 DIAGNOSIS — N2581 Secondary hyperparathyroidism of renal origin: Secondary | ICD-10-CM | POA: Diagnosis not present

## 2017-05-12 DIAGNOSIS — D649 Anemia, unspecified: Secondary | ICD-10-CM | POA: Diagnosis not present

## 2017-05-12 DIAGNOSIS — D508 Other iron deficiency anemias: Secondary | ICD-10-CM | POA: Diagnosis not present

## 2017-05-14 DIAGNOSIS — D649 Anemia, unspecified: Secondary | ICD-10-CM | POA: Diagnosis not present

## 2017-05-14 DIAGNOSIS — E876 Hypokalemia: Secondary | ICD-10-CM | POA: Diagnosis not present

## 2017-05-14 DIAGNOSIS — E875 Hyperkalemia: Secondary | ICD-10-CM | POA: Diagnosis not present

## 2017-05-14 DIAGNOSIS — D508 Other iron deficiency anemias: Secondary | ICD-10-CM | POA: Diagnosis not present

## 2017-05-14 DIAGNOSIS — N186 End stage renal disease: Secondary | ICD-10-CM | POA: Diagnosis not present

## 2017-05-14 DIAGNOSIS — N2581 Secondary hyperparathyroidism of renal origin: Secondary | ICD-10-CM | POA: Diagnosis not present

## 2017-05-16 DIAGNOSIS — N186 End stage renal disease: Secondary | ICD-10-CM | POA: Diagnosis not present

## 2017-05-16 DIAGNOSIS — D508 Other iron deficiency anemias: Secondary | ICD-10-CM | POA: Diagnosis not present

## 2017-05-16 DIAGNOSIS — N2581 Secondary hyperparathyroidism of renal origin: Secondary | ICD-10-CM | POA: Diagnosis not present

## 2017-05-16 DIAGNOSIS — D649 Anemia, unspecified: Secondary | ICD-10-CM | POA: Diagnosis not present

## 2017-05-16 DIAGNOSIS — E876 Hypokalemia: Secondary | ICD-10-CM | POA: Diagnosis not present

## 2017-05-16 DIAGNOSIS — E875 Hyperkalemia: Secondary | ICD-10-CM | POA: Diagnosis not present

## 2017-05-18 ENCOUNTER — Other Ambulatory Visit: Payer: Self-pay

## 2017-05-18 ENCOUNTER — Emergency Department (HOSPITAL_COMMUNITY): Payer: Medicare Other

## 2017-05-18 ENCOUNTER — Encounter (HOSPITAL_COMMUNITY): Payer: Self-pay | Admitting: Emergency Medicine

## 2017-05-18 DIAGNOSIS — Z7982 Long term (current) use of aspirin: Secondary | ICD-10-CM | POA: Insufficient documentation

## 2017-05-18 DIAGNOSIS — Z79899 Other long term (current) drug therapy: Secondary | ICD-10-CM | POA: Diagnosis not present

## 2017-05-18 DIAGNOSIS — N186 End stage renal disease: Secondary | ICD-10-CM | POA: Insufficient documentation

## 2017-05-18 DIAGNOSIS — R0989 Other specified symptoms and signs involving the circulatory and respiratory systems: Secondary | ICD-10-CM | POA: Diagnosis not present

## 2017-05-18 DIAGNOSIS — R0602 Shortness of breath: Secondary | ICD-10-CM | POA: Insufficient documentation

## 2017-05-18 DIAGNOSIS — I120 Hypertensive chronic kidney disease with stage 5 chronic kidney disease or end stage renal disease: Secondary | ICD-10-CM | POA: Insufficient documentation

## 2017-05-18 DIAGNOSIS — Z7902 Long term (current) use of antithrombotics/antiplatelets: Secondary | ICD-10-CM | POA: Diagnosis not present

## 2017-05-18 DIAGNOSIS — R05 Cough: Secondary | ICD-10-CM | POA: Diagnosis not present

## 2017-05-18 MED ORDER — ALBUTEROL SULFATE (2.5 MG/3ML) 0.083% IN NEBU
5.0000 mg | INHALATION_SOLUTION | Freq: Once | RESPIRATORY_TRACT | Status: AC
  Administered 2017-05-18: 5 mg via RESPIRATORY_TRACT
  Filled 2017-05-18: qty 6

## 2017-05-18 NOTE — ED Triage Notes (Signed)
Pt c/o intermittent productive cough x 2 weeks, worse when trying to lay flat at night. Denies chest pain/shortness of breath, wheezing noted in the right lobes. Dialysis pt, tues/thurs/sat, has not missed treatments.

## 2017-05-19 ENCOUNTER — Emergency Department (HOSPITAL_COMMUNITY)
Admission: EM | Admit: 2017-05-19 | Discharge: 2017-05-19 | Disposition: A | Discharge status: Home / Self Care | Payer: Medicare Other | Attending: Emergency Medicine | Admitting: Emergency Medicine

## 2017-05-19 DIAGNOSIS — N186 End stage renal disease: Secondary | ICD-10-CM | POA: Diagnosis not present

## 2017-05-19 DIAGNOSIS — E876 Hypokalemia: Secondary | ICD-10-CM | POA: Diagnosis not present

## 2017-05-19 DIAGNOSIS — D649 Anemia, unspecified: Secondary | ICD-10-CM | POA: Diagnosis not present

## 2017-05-19 DIAGNOSIS — D508 Other iron deficiency anemias: Secondary | ICD-10-CM | POA: Diagnosis not present

## 2017-05-19 DIAGNOSIS — N2581 Secondary hyperparathyroidism of renal origin: Secondary | ICD-10-CM | POA: Diagnosis not present

## 2017-05-19 DIAGNOSIS — E875 Hyperkalemia: Secondary | ICD-10-CM | POA: Diagnosis not present

## 2017-05-19 DIAGNOSIS — R0989 Other specified symptoms and signs involving the circulatory and respiratory systems: Secondary | ICD-10-CM

## 2017-05-19 LAB — CBC WITH DIFFERENTIAL/PLATELET
BASOS ABS: 0 10*3/uL (ref 0.0–0.1)
Basophils Relative: 0 %
Eosinophils Absolute: 1 10*3/uL — ABNORMAL HIGH (ref 0.0–0.7)
Eosinophils Relative: 11 %
HEMATOCRIT: 34.7 % — AB (ref 39.0–52.0)
HEMOGLOBIN: 10.5 g/dL — AB (ref 13.0–17.0)
LYMPHS ABS: 1.3 10*3/uL (ref 0.7–4.0)
Lymphocytes Relative: 15 %
MCH: 25 pg — AB (ref 26.0–34.0)
MCHC: 30.3 g/dL (ref 30.0–36.0)
MCV: 82.6 fL (ref 78.0–100.0)
Monocytes Absolute: 0.6 10*3/uL (ref 0.1–1.0)
Monocytes Relative: 7 %
NEUTROS ABS: 5.7 10*3/uL (ref 1.7–7.7)
NEUTROS PCT: 67 %
PLATELETS: 295 10*3/uL (ref 150–400)
RBC: 4.2 MIL/uL — ABNORMAL LOW (ref 4.22–5.81)
RDW: 18.2 % — ABNORMAL HIGH (ref 11.5–15.5)
WBC: 8.5 10*3/uL (ref 4.0–10.5)

## 2017-05-19 LAB — BASIC METABOLIC PANEL
ANION GAP: 14 (ref 5–15)
BUN: 62 mg/dL — ABNORMAL HIGH (ref 6–20)
CHLORIDE: 103 mmol/L (ref 101–111)
CO2: 20 mmol/L — ABNORMAL LOW (ref 22–32)
Calcium: 8 mg/dL — ABNORMAL LOW (ref 8.9–10.3)
Creatinine, Ser: 11.35 mg/dL — ABNORMAL HIGH (ref 0.61–1.24)
GFR calc Af Amer: 5 mL/min — ABNORMAL LOW (ref >60)
GFR, EST NON AFRICAN AMERICAN: 4 mL/min — AB (ref >60)
Glucose, Bld: 100 mg/dL — ABNORMAL HIGH (ref 65–99)
POTASSIUM: 4.9 mmol/L (ref 3.5–5.1)
Sodium: 137 mmol/L (ref 135–145)

## 2017-05-19 LAB — URINALYSIS, ROUTINE W REFLEX MICROSCOPIC
BILIRUBIN URINE: NEGATIVE
GLUCOSE, UA: 50 mg/dL — AB
Ketones, ur: NEGATIVE mg/dL
NITRITE: NEGATIVE
Protein, ur: 100 mg/dL — AB
SPECIFIC GRAVITY, URINE: 1.008 (ref 1.005–1.030)
pH: 8 (ref 5.0–8.0)

## 2017-05-19 LAB — TROPONIN I: Troponin I: <0.03 ng/mL (ref <0.03)

## 2017-05-19 LAB — BRAIN NATRIURETIC PEPTIDE: B Natriuretic Peptide: 2037 pg/mL — ABNORMAL HIGH (ref 0.0–100.0)

## 2017-05-19 MED ORDER — FUROSEMIDE 10 MG/ML IJ SOLN
80.0000 mg | Freq: Once | INTRAMUSCULAR | Status: AC
  Administered 2017-05-19: 80 mg via INTRAVENOUS
  Filled 2017-05-19: qty 8

## 2017-05-19 NOTE — ED Notes (Signed)
sats 95% on RA while ambulating

## 2017-05-19 NOTE — Discharge Instructions (Signed)
Go directly to dialysis today.  Your coughing is likely due to increased fluid in your lungs and in your legs.  Return to the ED if you develop increased shortness of breath, chest pain or any other concerns.

## 2017-05-19 NOTE — ED Provider Notes (Addendum)
[place] EMERGENCY DEPARTMENT Provider Note   CSN: [id number] Arrival date & time: 05/18/17  2155     History   Chief Complaint Chief Complaint  Patient presents with  . Cough    HPI Ryan [name] is a 57 y.o. male.  Patient with history of end-stage renal disease on dialysis Tuesday Thursday Saturday presenting with intermittent cough for the past 2 weeks it is worse with lying down.  He describes it as "postnasal drip" and is worse at night than during the day.  He has been using over-the-counter cough remedies and humidifier without relief.  Notably patient did have a kidney transplant in 2010 but underwent rejection several years later.  He is back on dialysis now with though the kidney is functioning somewhat.  He still makes urine.  Denies any chest pain.  Cough is productive of clear mucus.  Denies any fever, chills, nausea or vomiting.  States his leg swelling is somewhat worse than baseline.  Has been compliant with his dialysis sessions.  His dry weight is 80.5 kg.  He is due for dialysis in 2-1/2 hours.   The history is provided by the patient.  Cough  Associated symptoms include rhinorrhea and shortness of breath. Pertinent negatives include no chest pain, no headaches and no myalgias.    Past Medical History:  Diagnosis Date  . Blood transfusion without reported diagnosis   . ESRD (end stage renal disease) [facility])     Patient Active Problem List   Diagnosis Date Noted  . Renal transplant, status post 08/26/2016  . Essential hypertension 08/26/2016  . ESRD (end stage renal disease) ([place]) 08/26/2016  . EKG abnormalities   . 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 A/V FISTULAGRAM Left 12/15/2016   Procedure: A/V Fistulagram - left;  Surgeon: Angelia Mould, MD;  Location: [place] [facility];  Service: Cardiovascular;  Laterality: Left;  . AV FISTULA PLACEMENT Left  08/28/2016   Procedure: LEFT UPPER  ARM ARTERIOVENOUS (AV) FISTULA CREATION;  Surgeon: Angelia Mould, MD;  Location: [employer];  Service: Vascular;  Laterality: Left;  . KIDNEY TRANSPLANT         Home Medications    Prior to Admission medications   Medication Sig Start Date End Date Taking? Authorizing Provider  aspirin 325 MG tablet Take 325 mg by mouth daily.     [provider]  cloNIDine (CATAPRES) 0.2 MG tablet Take 0.1 mg by mouth 2 (two) times daily. 12/01/16   [provider]  enalapril (VASOTEC) 20 MG tablet Take 20 mg by mouth 2 (two) times daily.    [provider]  furosemide (LASIX) 80 MG tablet TAKE 1 TABLET (80 MG TOTAL) BY MOUTH 2 TIMES DAILY. 08/28/16   [provider]  labetalol (NORMODYNE) 200 MG tablet Take 200 mg by mouth 2 (two) times daily.     [provider]  loratadine (CLARITIN) 10 MG tablet Take 10 mg by mouth daily as needed for allergies.    [provider]  multivitamin (RENA-VIT) TABS tablet Take 1 tablet by mouth daily.    [provider]  mycophenolate (MYFORTIC) 360 MG TBEC EC tablet Take 360 mg by mouth 2 (two) times daily.    [provider]  Naphazoline HCl (CLEAR EYES OP) Place 1-2 drops into both eyes.    [provider]  naphazoline-pheniramine (VISINE-A) 0.025-0.3 % ophthalmic solution Place 1-2 drops into  both eyes 4 (four) times daily as needed (for allergies/red eyes).    [provider]  NIFEdipine (PROCARDIA XL/ADALAT-CC) 60 MG 24 hr tablet Take 60 mg by mouth 2 (two) times daily.    [provider]  Nutritional Supplements (FEEDING SUPPLEMENT, NEPRO CARB STEADY,) LIQD Take 237 mLs by mouth 3 (three) times daily between meals. Patient taking differently: Take 237 mLs by mouth every other day.  08/28/16   Lavina Hamman, MD  pravastatin (PRAVACHOL) 80 MG tablet Take 80 mg by mouth daily.  08/16/15   [provider]  predniSONE (DELTASONE) 5  MG tablet Take 5 mg by mouth daily with breakfast.    [provider]  PROGRAF 1 MG capsule Take 3 mg by mouth 2 (two) times daily.  07/26/15   [provider]  RENAGEL 800 MG tablet Take 800-2,400 mg by mouth See admin instructions. Take 3 tablets with meals & 1 tablet with snacks. 12/01/16   [provider]  sevelamer carbonate (RENVELA) 800 MG tablet Take 2 tablets (1,600 mg total) by mouth 3 (three) times daily with meals. Patient taking differently: Take 800 mg by mouth 3 (three) times daily with meals.  08/28/16   Lavina Hamman, MD  tacrolimus (PROGRAF) 5 MG capsule Take 5 mg by mouth 2 (two) times daily.    [provider]    Family History Family History  Problem Relation Age of Onset  . Hypertension Mother   . Heart disease Mother   . Stroke Mother   . Cancer Father   . Kidney cancer Father   . Hypertension Sister   . Heart disease Maternal Grandmother   . Alcohol abuse Maternal Grandfather   . Mental illness Paternal Grandmother   . Learning disabilities Paternal Grandmother        Alzheimer's   . Stroke Paternal Grandfather     Social History Social History   Tobacco Use  . Smoking status: Never Smoker  . Smokeless tobacco: Never Used  Substance Use Topics  . Alcohol use: No  . Drug use: No     Allergies   No known allergies   Review of Systems Review of Systems  Constitutional: Negative for activity change, appetite change and fever.  HENT: Positive for congestion, postnasal drip and rhinorrhea.   Respiratory: Positive for cough and shortness of breath. Negative for chest tightness.   Cardiovascular: Negative for chest pain.  Gastrointestinal: Negative for abdominal pain, nausea and vomiting.  Genitourinary: Negative for dysuria, hematuria and testicular pain.  Musculoskeletal: Negative for arthralgias and myalgias.  Skin: Negative for rash.  Neurological: Negative for dizziness, weakness and headaches.    all other  systems are negative except as noted in the HPI and PMH.    Physical Exam Updated Vital Signs BP (!) 155/92 (BP Location: Right Arm)   Pulse 82   Temp 98.1 F (36.7 C) (Oral)   Resp 16   SpO2 99%   Physical Exam  Constitutional: He is oriented to person, place, and time. He appears well-developed and well-nourished. No distress.  Speaking in full sentences  HENT:  Head: Normocephalic and atraumatic.  Mouth/Throat: Oropharynx is clear and moist. No oropharyngeal exudate.  Eyes: Conjunctivae and EOM are normal. Pupils are equal, round, and reactive to light.  Neck: Normal range of motion. Neck supple.  No meningismus.  Cardiovascular: Normal rate, regular rhythm, normal heart sounds and intact distal pulses.  No murmur heard. Pulmonary/Chest: Effort normal. No respiratory distress. He has  rales.  Bibasilar crackles  Abdominal: Soft. There is no tenderness. There is no rebound and no guarding.  Musculoskeletal: Normal range of motion. He exhibits edema. He exhibits no tenderness.  +2 edema to knees AV fistula LUE with thrill  Neurological: He is alert and oriented to person, place, and time. No cranial nerve deficit. He exhibits normal muscle tone. Coordination normal.  No ataxia on finger to nose bilaterally. No pronator drift. 5/5 strength throughout. CN 2-12 intact.Equal grip strength. Sensation intact.   Skin: Skin is warm.  Psychiatric: He has a normal mood and affect. His behavior is normal.  Nursing note and vitals reviewed.    ED Treatments / Results  Labs (all labs ordered are listed, but only abnormal results are displayed) Labs Reviewed  CBC WITH DIFFERENTIAL/PLATELET - Abnormal; Notable for the following components:      Result Value   RBC 4.20 (*)    Hemoglobin 10.5 (*)    HCT 34.7 (*)    MCH 25.0 (*)    RDW 18.2 (*)    Eosinophils Absolute 1.0 (*)    All other components within normal limits  BASIC METABOLIC PANEL - Abnormal; Notable for the following  components:   CO2 20 (*)    Glucose, Bld 100 (*)    BUN 62 (*)    Creatinine, Ser 11.35 (*)    Calcium 8.0 (*)    GFR calc non Af Amer 4 (*)    GFR calc Af Amer 5 (*)    All other components within normal limits  BRAIN NATRIURETIC PEPTIDE - Abnormal; Notable for the following components:   B Natriuretic Peptide 2,037.0 (*)    All other components within normal limits  URINALYSIS, ROUTINE W REFLEX MICROSCOPIC - Abnormal; Notable for the following components:   APPearance HAZY (*)    Glucose, UA 50 (*)    Hgb urine dipstick MODERATE (*)    Protein, ur 100 (*)    Leukocytes, UA LARGE (*)    Bacteria, UA RARE (*)    Squamous Epithelial / LPF 0-5 (*)    Non Squamous Epithelial 0-5 (*)    All other components within normal limits  URINE CULTURE  TROPONIN I    EKG  EKG Interpretation  Date/Time:  Tuesday May 19 2017 05:21:35 EST Ventricular Rate:  83 PR Interval:    QRS Duration: 88 QT Interval:  397 QTC Calculation: 467 R Axis:   112 Text Interpretation:  Sinus rhythm Right axis deviation Low voltage, extremity leads Anteroseptal infarct, old wandering baseline Confirmed by Ezequiel Essex [phone]) on 05/19/2017 5:31:23 AM Also confirmed by Ezequiel Essex [phone]), editor Oswaldo Milian, [place] ([id number])  on 05/19/2017 7:05:43 AM       Radiology Dg Chest 2 View  Result Date: 05/18/2017 CLINICAL DATA:  Intermittent productive cough for 3 weeks EXAM: CHEST  2 VIEW COMPARISON:  08/26/2016 FINDINGS: Cardiomegaly, mild vascular congestion. Mild peribronchial thickening. Small right pleural effusion. No confluent opacities or effusions. IMPRESSION: Cardiomegaly, vascular congestion. Small right pleural effusion. Bronchitic changes. Electronically Signed   By: Rolm Baptise M.D.   On: 05/18/2017 22:40    Procedures Procedures (including critical care time)  Medications Ordered in ED Medications  albuterol (PROVENTIL) (2.5 MG/3ML) 0.083% nebulizer solution 5 mg (5 mg Nebulization  Given 05/18/17 2210)     Initial Impression / Assessment and Plan / ED Course  I have reviewed the triage vital signs and the nursing notes.  Pertinent labs & imaging results that were available during  my care of the patient were reviewed by me and considered in my medical decision making (see chart for details).    Renal transplant patient now back on dialysis presenting with 2 weeks of productive cough worse with lying down.  No fever or chest pain.  No distress and no hypoxia.  X-ray shows congestion and small pleural effusions.  He does appear to be volume overloaded and is due for dialysis in 2-1/2 hours.  Labs without significant hyperkalemia. Does have elevated BNP,.  Suspect cough and congestion due to vascular congestion and pulmonary edema. No increased worked of breathing, no hypoxia with ambulation.   IV lasix given and patient was able to diuresis some. Would most benefit from dialysis which patient has scheduled for this morning. D/w Dr. Servando Salina of nephrology who agrees. Patient knows to go straight to dialysis this morning.  UA results noted. Patient without symptoms of UTI. Culture sent. Fosfomycin called to patient's pharmacy. Return precautions discussed.   Final Clinical Impressions(s) / ED Diagnoses   Final diagnoses:  Pulmonary vascular congestion  ESRD (end stage renal disease) [facility])    ED Discharge Orders    None       Ezequiel Essex, MD 05/19/17 1924    Ezequiel Essex, MD 05/19/17 2128

## 2017-05-21 DIAGNOSIS — E875 Hyperkalemia: Secondary | ICD-10-CM | POA: Diagnosis not present

## 2017-05-21 DIAGNOSIS — N186 End stage renal disease: Secondary | ICD-10-CM | POA: Diagnosis not present

## 2017-05-21 DIAGNOSIS — D649 Anemia, unspecified: Secondary | ICD-10-CM | POA: Diagnosis not present

## 2017-05-21 DIAGNOSIS — E876 Hypokalemia: Secondary | ICD-10-CM | POA: Diagnosis not present

## 2017-05-21 DIAGNOSIS — N2581 Secondary hyperparathyroidism of renal origin: Secondary | ICD-10-CM | POA: Diagnosis not present

## 2017-05-21 DIAGNOSIS — D508 Other iron deficiency anemias: Secondary | ICD-10-CM | POA: Diagnosis not present

## 2017-05-22 DIAGNOSIS — N186 End stage renal disease: Secondary | ICD-10-CM | POA: Diagnosis not present

## 2017-05-22 DIAGNOSIS — Z992 Dependence on renal dialysis: Secondary | ICD-10-CM | POA: Diagnosis not present

## 2017-05-22 DIAGNOSIS — T8612 Kidney transplant failure: Secondary | ICD-10-CM | POA: Diagnosis not present

## 2017-05-23 DIAGNOSIS — E441 Mild protein-calorie malnutrition: Secondary | ICD-10-CM | POA: Insufficient documentation

## 2017-05-23 DIAGNOSIS — N186 End stage renal disease: Secondary | ICD-10-CM | POA: Diagnosis not present

## 2017-05-23 DIAGNOSIS — D649 Anemia, unspecified: Secondary | ICD-10-CM | POA: Diagnosis not present

## 2017-05-23 DIAGNOSIS — N2581 Secondary hyperparathyroidism of renal origin: Secondary | ICD-10-CM | POA: Diagnosis not present

## 2017-05-23 DIAGNOSIS — E876 Hypokalemia: Secondary | ICD-10-CM | POA: Diagnosis not present

## 2017-05-23 DIAGNOSIS — D508 Other iron deficiency anemias: Secondary | ICD-10-CM | POA: Diagnosis not present

## 2017-05-23 DIAGNOSIS — E440 Moderate protein-calorie malnutrition: Secondary | ICD-10-CM | POA: Insufficient documentation

## 2017-05-26 DIAGNOSIS — E876 Hypokalemia: Secondary | ICD-10-CM | POA: Diagnosis not present

## 2017-05-26 DIAGNOSIS — D649 Anemia, unspecified: Secondary | ICD-10-CM | POA: Diagnosis not present

## 2017-05-26 DIAGNOSIS — N2581 Secondary hyperparathyroidism of renal origin: Secondary | ICD-10-CM | POA: Diagnosis not present

## 2017-05-26 DIAGNOSIS — N186 End stage renal disease: Secondary | ICD-10-CM | POA: Diagnosis not present

## 2017-05-26 DIAGNOSIS — D508 Other iron deficiency anemias: Secondary | ICD-10-CM | POA: Diagnosis not present

## 2017-05-28 DIAGNOSIS — E876 Hypokalemia: Secondary | ICD-10-CM | POA: Diagnosis not present

## 2017-05-28 DIAGNOSIS — D508 Other iron deficiency anemias: Secondary | ICD-10-CM | POA: Diagnosis not present

## 2017-05-28 DIAGNOSIS — D649 Anemia, unspecified: Secondary | ICD-10-CM | POA: Diagnosis not present

## 2017-05-28 DIAGNOSIS — N2581 Secondary hyperparathyroidism of renal origin: Secondary | ICD-10-CM | POA: Diagnosis not present

## 2017-05-28 DIAGNOSIS — N186 End stage renal disease: Secondary | ICD-10-CM | POA: Diagnosis not present

## 2017-05-30 DIAGNOSIS — N186 End stage renal disease: Secondary | ICD-10-CM | POA: Diagnosis not present

## 2017-05-30 DIAGNOSIS — N2581 Secondary hyperparathyroidism of renal origin: Secondary | ICD-10-CM | POA: Diagnosis not present

## 2017-05-30 DIAGNOSIS — D649 Anemia, unspecified: Secondary | ICD-10-CM | POA: Diagnosis not present

## 2017-05-30 DIAGNOSIS — E876 Hypokalemia: Secondary | ICD-10-CM | POA: Diagnosis not present

## 2017-05-30 DIAGNOSIS — D508 Other iron deficiency anemias: Secondary | ICD-10-CM | POA: Diagnosis not present

## 2017-06-02 DIAGNOSIS — D508 Other iron deficiency anemias: Secondary | ICD-10-CM | POA: Diagnosis not present

## 2017-06-02 DIAGNOSIS — E876 Hypokalemia: Secondary | ICD-10-CM | POA: Diagnosis not present

## 2017-06-02 DIAGNOSIS — N186 End stage renal disease: Secondary | ICD-10-CM | POA: Diagnosis not present

## 2017-06-02 DIAGNOSIS — D649 Anemia, unspecified: Secondary | ICD-10-CM | POA: Diagnosis not present

## 2017-06-02 DIAGNOSIS — N2581 Secondary hyperparathyroidism of renal origin: Secondary | ICD-10-CM | POA: Diagnosis not present

## 2017-06-04 ENCOUNTER — Ambulatory Visit (INDEPENDENT_AMBULATORY_CARE_PROVIDER_SITE_OTHER): Payer: Medicare Other

## 2017-06-04 ENCOUNTER — Encounter: Payer: Self-pay | Admitting: Physician Assistant

## 2017-06-04 ENCOUNTER — Ambulatory Visit (INDEPENDENT_AMBULATORY_CARE_PROVIDER_SITE_OTHER): Payer: Medicare Other | Admitting: Physician Assistant

## 2017-06-04 VITALS — BP 160/80 | HR 86 | Temp 98.3°F | Ht 70.0 in | Wt 182.0 lb

## 2017-06-04 DIAGNOSIS — R0602 Shortness of breath: Secondary | ICD-10-CM

## 2017-06-04 DIAGNOSIS — D649 Anemia, unspecified: Secondary | ICD-10-CM | POA: Diagnosis not present

## 2017-06-04 DIAGNOSIS — N2581 Secondary hyperparathyroidism of renal origin: Secondary | ICD-10-CM | POA: Diagnosis not present

## 2017-06-04 DIAGNOSIS — E876 Hypokalemia: Secondary | ICD-10-CM | POA: Diagnosis not present

## 2017-06-04 DIAGNOSIS — D508 Other iron deficiency anemias: Secondary | ICD-10-CM | POA: Diagnosis not present

## 2017-06-04 DIAGNOSIS — N186 End stage renal disease: Secondary | ICD-10-CM | POA: Diagnosis not present

## 2017-06-04 DIAGNOSIS — J90 Pleural effusion, not elsewhere classified: Secondary | ICD-10-CM | POA: Diagnosis not present

## 2017-06-04 MED ORDER — DOXYCYCLINE HYCLATE 100 MG PO TABS: 100.0000 mg | ORAL_TABLET | Freq: Two times a day (BID) | ORAL | 0 refills | Status: DC

## 2017-06-04 NOTE — Patient Instructions (Addendum)
It was great to see you.  Please make a follow-up appointment with me so we can discuss your cholesterol medications and perform a fasting lipid panel.  Please discuss your blood pressure medications with your nephrologist.  If you develop any significant chest pain, shortness of breath or lower leg swelling --> GO TO THE EMERGENCY ROOM!  Start the doxycycline and take as prescribed. Please speak with your nephrologist about your dry weight and any issues you are having with lower leg swelling.

## 2017-06-04 NOTE — Progress Notes (Signed)
Ryan [name] is a 57 y.o. male here for a new problem.  I acted as a Education administrator for [employer], PA-C Anselmo Pickler, LPN  History of Present Illness:   Chief Complaint  Patient presents with  . Cough    Cough  This is a new problem. Episode onset: Started 2 weeks ago. The problem has been unchanged. The problem occurs every few hours. The cough is productive of purulent sputum. Associated symptoms include nasal congestion and postnasal drip. Pertinent negatives include no chest pain, chills, ear congestion, ear pain, fever, headaches, hemoptysis, sore throat or shortness of breath. The symptoms are aggravated by lying down (worse at night). He has tried OTC cough suppressant (Delsym, cold humidifier occassionally) for the symptoms. The treatment provided mild relief. There is no history of asthma, bronchitis, COPD, emphysema, environmental allergies or pneumonia.   Went to ED on 2/2/519 for cough, work-up revealed pulmonary vascular congestion, he was found to be volume overloaded and it was recommended that he go to HD. Since he has left the ED he has been going to HD regularly. Went to HD this morning. Taking 80 mg lasix BID, states that his kidney doctor recently suggested that they decrease it but didn't (?)   Dry weight is currently 78.6 kg --> has been decreased recently. His current weight in our office is 82.6 kg, reports that he went to HD this AM. Coughing persists since his ED visit. He reports that he is currently without any swelling.  Chest xray in ED 05/18/17 --> R pleural effusion, vascular congestion, bronchitic changes.  Past Medical History:  Diagnosis Date  . Blood transfusion without reported diagnosis   . ESRD (end stage renal disease) ([place])      Social History   Socioeconomic History  . Marital status: Divorced    Spouse name: Not on file  . Number of children: Not on file  . Years of education: Not on file  . Highest education level: Not on file   Social Needs  . Financial resource strain: Not on file  . Food insecurity - worry: Not on file  . Food insecurity - inability: Not on file  . Transportation needs - medical: Not on file  . Transportation needs - non-medical: Not on file  Occupational History  . Not on file  Tobacco Use  . Smoking status: Never Smoker  . Smokeless tobacco: Never Used  Substance and Sexual Activity  . Alcohol use: No  . Drug use: No  . Sexual activity: No  Other Topics Concern  . 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 A/V FISTULAGRAM Left 12/15/2016   Procedure: A/V Fistulagram - left;  Surgeon: Angelia Mould, MD;  Location: [facility];  Service: Cardiovascular;  Laterality: Left;  . AV FISTULA PLACEMENT Left 08/28/2016   Procedure: LEFT UPPER  ARM ARTERIOVENOUS (AV) FISTULA CREATION;  Surgeon: Angelia Mould, MD;  Location: [facility];  Service: Vascular;  Laterality: Left;  . KIDNEY TRANSPLANT      Family History  Problem Relation Age of Onset  . Hypertension Mother   . Heart disease Mother   . Stroke Mother   . Cancer Father   . Kidney cancer Father   . Hypertension Sister   . Heart disease Maternal Grandmother   . Alcohol abuse Maternal  Grandfather   . Mental illness Paternal Grandmother   . Learning disabilities Paternal Grandmother        Alzheimer's   . Stroke Paternal Grandfather     Allergies  Allergen Reactions  . No Known Allergies     Current Medications:   Current Outpatient Medications:  .  aspirin 325 MG tablet, Take 325 mg by mouth daily. , Disp: , Rfl:  .  cloNIDine (CATAPRES) 0.2 MG tablet, Take 0.1 mg by mouth 2 (two) times daily., Disp: , Rfl: 1 .  enalapril (VASOTEC) 20 MG tablet, Take 20 mg by mouth 2 (two) times daily., Disp: , Rfl:  .  furosemide (LASIX) 80 MG tablet, TAKE 1 TABLET (80 MG TOTAL) BY MOUTH  2 TIMES DAILY., Disp: , Rfl: 5 .  labetalol (NORMODYNE) 200 MG tablet, Take 200 mg by mouth 2 (two) times daily. , Disp: , Rfl:  .  loratadine (CLARITIN) 10 MG tablet, Take 10 mg by mouth daily as needed for allergies., Disp: , Rfl:  .  multivitamin (RENA-VIT) TABS tablet, Take 1 tablet by mouth daily., Disp: , Rfl:  .  mycophenolate (MYFORTIC) 360 MG TBEC EC tablet, Take 360 mg by mouth 2 (two) times daily., Disp: , Rfl:  .  NIFEdipine (PROCARDIA XL/ADALAT-CC) 60 MG 24 hr tablet, Take 60 mg by mouth 2 (two) times daily., Disp: , Rfl:  .  Nutritional Supplements (FEEDING SUPPLEMENT, NEPRO CARB STEADY,) LIQD, Take 237 mLs by mouth 3 (three) times daily between meals., Disp: 21 Can, Rfl: 0 .  pravastatin (PRAVACHOL) 80 MG tablet, Take 80 mg by mouth daily. , Disp: , Rfl: 2 .  predniSONE (DELTASONE) 5 MG tablet, Take 5 mg by mouth daily with breakfast., Disp: , Rfl:  .  RENAGEL 800 MG tablet, Take 800-2,400 mg by mouth See admin instructions. Take 3 tablets with meals & 1 tablet with snacks., Disp: , Rfl: 5 .  tacrolimus (PROGRAF) 5 MG capsule, Take 5 mg by mouth 2 (two) times daily., Disp: , Rfl:  .  doxycycline (VIBRA-TABS) 100 MG tablet, Take 1 tablet (100 mg total) by mouth 2 (two) times daily., Disp: 20 tablet, Rfl: 0   Review of Systems:   Review of Systems  Constitutional: Negative for chills and fever.  HENT: Positive for postnasal drip. Negative for ear pain and sore throat.   Respiratory: Positive for cough. Negative for hemoptysis and shortness of breath.   Cardiovascular: Negative for chest pain.  Neurological: Negative for headaches.  Endo/Heme/Allergies: Negative for environmental allergies.    Vitals:   Vitals:   06/04/17 1302  BP: (!) 160/80  Pulse: 86  Temp: 98.3 F (36.8 C)  TempSrc: Oral  SpO2: 98%  Weight: 182 lb (82.6 kg)  Height: 5\' 10"  (1.778 m)     Body mass index is 26.11 kg/m.  Physical Exam:   Physical Exam  Constitutional: He appears well-developed.  He is cooperative.  Non-toxic appearance. He does not have a sickly appearance. He does not appear ill. No distress.  HENT:  Head: Normocephalic and atraumatic.  Right Ear: Tympanic membrane, external ear and ear canal normal. Tympanic membrane is not erythematous, not retracted and not bulging.  Left Ear: Tympanic membrane, external ear and ear canal normal. Tympanic membrane is not erythematous, not retracted and not bulging.  Nose: Mucosal edema and rhinorrhea present. Right sinus exhibits no maxillary sinus tenderness and no frontal sinus tenderness. Left sinus exhibits no maxillary sinus tenderness and no frontal sinus tenderness.  Mouth/Throat:  Uvula is midline and mucous membranes are normal. Posterior oropharyngeal erythema present. No posterior oropharyngeal edema. Tonsils are 1+ on the right. Tonsils are 1+ on the left.  Eyes: Conjunctivae and lids are normal.  Neck: Trachea normal.  Cardiovascular: Normal rate, regular rhythm, S1 normal, S2 normal and normal heart sounds.  Pulmonary/Chest: Effort normal and breath sounds normal. He has no decreased breath sounds. He has no wheezes. He has no rhonchi. He has no rales.  Musculoskeletal:  Bilateral ankle swelling  Lymphadenopathy:    He has no cervical adenopathy.  Neurological: He is alert.  Skin: Skin is warm, dry and intact.  Psychiatric: He has a normal mood and affect. His speech is normal and behavior is normal.  Nursing note and vitals reviewed.  CXR:   CLINICAL DATA:  57 y/o M; end-stage renal disease with dry cough and lower extremity swelling for 2 weeks.  EXAM: CHEST - 2 VIEW  COMPARISON:  03/17/2018 chest radiograph  FINDINGS: Stable cardiomegaly. Decreased small right pleural effusion. Pulmonary vascular congestion. No consolidation. Bones are unremarkable.  IMPRESSION: Stable cardiomegaly, stable pulmonary vascular congestion, and decreased small right effusion.   Electronically Signed   By: Kristine Garbe M.D.   On: 06/04/2017 16:29  Assessment and Plan:    Jedidiah was seen today for cough.  Diagnoses and all orders for this visit:  SOB (shortness of breath) -     DG Chest 2 View; Future  Other orders -     doxycycline (VIBRA-TABS) 100 MG tablet; Take 1 tablet (100 mg total) by mouth 2 (two) times daily.   CXR and physical exam consistent with volume overload, slightly improved since ED visit. CXR shows stable pulmonary vascular congestion, which correlates with his ongoing cough. I discussed with him that he needs to touch base with his renal MD for further guidance in his volume management. Given the fact that his cough remains productive, will cover for bacterial etiology and start doxycycline. I recommended that he go to the ER should his SOB, coughing or LE edema worsen. Patient verbalized understanding.   . Reviewed expectations re: course of current medical issues. . Discussed self-management of symptoms. . Outlined signs and symptoms indicating need for more acute intervention. . Patient verbalized understanding and all questions were answered. . See orders for this visit as documented in the electronic medical record. . Patient received an After-Visit Summary.    Inda Coke, PA-C

## 2017-06-06 DIAGNOSIS — D508 Other iron deficiency anemias: Secondary | ICD-10-CM | POA: Diagnosis not present

## 2017-06-06 DIAGNOSIS — N2581 Secondary hyperparathyroidism of renal origin: Secondary | ICD-10-CM | POA: Diagnosis not present

## 2017-06-06 DIAGNOSIS — E876 Hypokalemia: Secondary | ICD-10-CM | POA: Diagnosis not present

## 2017-06-06 DIAGNOSIS — D649 Anemia, unspecified: Secondary | ICD-10-CM | POA: Diagnosis not present

## 2017-06-06 DIAGNOSIS — N186 End stage renal disease: Secondary | ICD-10-CM | POA: Diagnosis not present

## 2017-06-09 DIAGNOSIS — N186 End stage renal disease: Secondary | ICD-10-CM | POA: Diagnosis not present

## 2017-06-09 DIAGNOSIS — D649 Anemia, unspecified: Secondary | ICD-10-CM | POA: Diagnosis not present

## 2017-06-09 DIAGNOSIS — D508 Other iron deficiency anemias: Secondary | ICD-10-CM | POA: Diagnosis not present

## 2017-06-09 DIAGNOSIS — N2581 Secondary hyperparathyroidism of renal origin: Secondary | ICD-10-CM | POA: Diagnosis not present

## 2017-06-09 DIAGNOSIS — E876 Hypokalemia: Secondary | ICD-10-CM | POA: Diagnosis not present

## 2017-06-10 ENCOUNTER — Ambulatory Visit (INDEPENDENT_AMBULATORY_CARE_PROVIDER_SITE_OTHER): Payer: Medicare Other | Admitting: Physician Assistant

## 2017-06-10 ENCOUNTER — Encounter: Payer: Self-pay | Admitting: Gastroenterology

## 2017-06-10 ENCOUNTER — Encounter: Payer: Self-pay | Admitting: Physician Assistant

## 2017-06-10 ENCOUNTER — Encounter: Payer: Self-pay | Admitting: *Deleted

## 2017-06-10 VITALS — BP 138/88 | HR 75 | Temp 97.6°F | Ht 70.0 in | Wt 174.6 lb

## 2017-06-10 DIAGNOSIS — E785 Hyperlipidemia, unspecified: Secondary | ICD-10-CM

## 2017-06-10 DIAGNOSIS — H579 Unspecified disorder of eye and adnexa: Secondary | ICD-10-CM

## 2017-06-10 DIAGNOSIS — Z1211 Encounter for screening for malignant neoplasm of colon: Secondary | ICD-10-CM

## 2017-06-10 MED ORDER — PRAVASTATIN SODIUM 80 MG PO TABS: 80.0000 mg | ORAL_TABLET | Freq: Every day | ORAL | 0 refills | Status: DC

## 2017-06-10 NOTE — Progress Notes (Signed)
Ryan [name] is a 57 y.o. male is here to discuss:  I acted as a Education administrator for [employer], PA-C Anselmo Pickler, LPN  History of Present Illness:   Chief Complaint  Patient presents with  . Follow-up  . Hyperlipidemia    DIscuss new medication (been without medication since September)    Hyperlipidemia  This is a chronic problem. Episode onset: Pt here discuss getting back on Cholesterol medication has been out of medication since September. Exacerbating diseases include chronic renal disease. Risk factors for coronary artery disease include dyslipidemia and male sex.   Currently on the 80mg  Pravachol dosage, however has not had any since October because he has been out.  Bilateral Eye Redness Has had some eye irritation in both eyes for a few days. He feels as though it may be seasonal allergies. He has tried some OTC re-wetting drops that the pharmacist has recommended. He has had some slight improvement of symptoms. Denies cough or any URI symptoms. Denies changes in vision or tearing.   Wt Readings from Last 3 Encounters:  06/10/17 174 lb 9.6 oz (79.2 kg)  06/04/17 182 lb (82.6 kg)  01/14/17 184 lb (83.5 kg)     Health Maintenance Due  Topic Date Due  . Hepatitis C Screening  11-24-1960  . HIV Screening  06/26/1976  . COLONOSCOPY  06/27/2011    Past Medical History:  Diagnosis Date  . Blood transfusion without reported diagnosis   . ESRD (end stage renal disease) ([place])      Social History   Socioeconomic History  . Marital status: Divorced    Spouse name: Not on file  . Number of children: Not on file  . Years of education: Not on file  . Highest education level: Not on file  Social Needs  . Financial resource strain: Not on file  . Food insecurity - worry: Not on file  . Food insecurity - inability: Not on file  . Transportation needs - medical: Not on file  . Transportation needs - non-medical: Not on file  Occupational History  . Not on  file  Tobacco Use  . Smoking status: Never Smoker  . Smokeless tobacco: Never Used  Substance and Sexual Activity  . Alcohol use: No  . Drug use: No  . Sexual activity: No  Other Topics Concern  . 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 A/V FISTULAGRAM Left 12/15/2016   Procedure: A/V Fistulagram - left;  Surgeon: Angelia Mould, MD;  Location: [facility];  Service: Cardiovascular;  Laterality: Left;  . AV FISTULA PLACEMENT Left 08/28/2016   Procedure: LEFT UPPER  ARM ARTERIOVENOUS (AV) FISTULA CREATION;  Surgeon: Angelia Mould, MD;  Location: [facility];  Service: Vascular;  Laterality: Left;  . KIDNEY TRANSPLANT      Family History  Problem Relation Age of Onset  . Hypertension Mother   . Heart disease Mother   . Stroke Mother   . Cancer Father   . Kidney cancer Father   . Hypertension Sister   . Heart disease Maternal Grandmother   . Alcohol abuse Maternal Grandfather   . Mental illness Paternal Grandmother   . Learning disabilities Paternal Grandmother        Alzheimer's   . Stroke Paternal Grandfather     PMHx, SurgHx,  SocialHx, FamHx, Medications, and Allergies were reviewed in the Visit Navigator and updated as appropriate.   Patient Active Problem List   Diagnosis Date Noted  . Renal transplant, status post 08/26/2016  . Essential hypertension 08/26/2016  . ESRD (end stage renal disease) ([place]) 08/26/2016  . Anemia 04/09/2016  . CKD (chronic kidney disease) 03/31/2016  . EKG abnormalities   . 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 Smoking status: Never Smoker  . Smokeless tobacco: Never Used  Substance Use Topics  . Alcohol use: No  . Drug use: No    Current Medications and Allergies:    Current Outpatient Medications:  .  cloNIDine (CATAPRES) 0.2 MG  tablet, Take 0.1 mg by mouth 2 (two) times daily., Disp: , Rfl: 1 .  doxycycline (VIBRA-TABS) 100 MG tablet, Take 1 tablet (100 mg total) by mouth 2 (two) times daily., Disp: 20 tablet, Rfl: 0 .  enalapril (VASOTEC) 20 MG tablet, Take 20 mg by mouth 2 (two) times daily., Disp: , Rfl:  .  furosemide (LASIX) 80 MG tablet, TAKE 1 TABLET (80 MG TOTAL) BY MOUTH 2 TIMES DAILY., Disp: , Rfl: 5 .  labetalol (NORMODYNE) 200 MG tablet, Take 200 mg by mouth 2 (two) times daily. , Disp: , Rfl:  .  loratadine (CLARITIN) 10 MG tablet, Take 10 mg by mouth daily as needed for allergies., Disp: , Rfl:  .  multivitamin (RENA-VIT) TABS tablet, Take 1 tablet by mouth daily., Disp: , Rfl:  .  mycophenolate (MYFORTIC) 360 MG TBEC EC tablet, Take 360 mg by mouth 2 (two) times daily., Disp: , Rfl:  .  NIFEdipine (PROCARDIA XL/ADALAT-CC) 60 MG 24 hr tablet, Take 60 mg by mouth 2 (two) times daily., Disp: , Rfl:  .  Nutritional Supplements (FEEDING SUPPLEMENT, NEPRO CARB STEADY,) LIQD, Take 237 mLs by mouth 3 (three) times daily between meals., Disp: 21 Can, Rfl: 0 .  pravastatin (PRAVACHOL) 80 MG tablet, Take 1 tablet (80 mg total) by mouth daily., Disp: 90 tablet, Rfl: 0 .  predniSONE (DELTASONE) 5 MG tablet, Take 5 mg by mouth daily with breakfast., Disp: , Rfl:  .  RENAGEL 800 MG tablet, Take 800-2,400 mg by mouth See admin instructions. Take 3 tablets with meals & 1 tablet with snacks., Disp: , Rfl: 5 .  tacrolimus (PROGRAF) 5 MG capsule, Take 5 mg by mouth 2 (two) times daily., Disp: , Rfl:    Allergies  Allergen Reactions  . No Known Allergies     Review of Systems   ROS  Negative unless otherwise specified per HPI.  Vitals:   Vitals:   06/10/17 1326  BP: 138/88  Pulse: 75  Temp: 97.6 F (36.4 C)  TempSrc: Oral  SpO2: 98%  Weight: 174 lb 9.6 oz (79.2 kg)  Height: 5\' 10"  (1.778 m)     Body mass index is 25.05 kg/m.   Physical Exam:    Physical Exam  Constitutional: He appears  well-developed. He is cooperative.  Non-toxic appearance. He does not have a sickly appearance. He does not appear ill. No distress.  HENT:  Head: Normocephalic and atraumatic.  Eyes: Lids are normal. Right conjunctiva is injected. Left conjunctiva is injected.  No excessive tearing or other discharge  Cardiovascular: Normal rate, regular rhythm, S1 normal, S2 normal, normal heart sounds and normal pulses.  No LE edema  Pulmonary/Chest: Effort normal and breath sounds normal.  Neurological: He is alert. GCS eye subscore  is 4. GCS verbal subscore is 5. GCS motor subscore is 6.  Skin: Skin is warm, dry and intact.  Psychiatric: He has a normal mood and affect. His speech is normal and behavior is normal.  Nursing note and vitals reviewed.    Assessment and Plan:    Genaro was seen today for follow-up and hyperlipidemia.  Diagnoses and all orders for this visit:  Hyperlipidemia, unspecified hyperlipidemia type Fasting lipid panel today. Refill Pravastatin 80 mg daily. Follow-up in 3 months. -     Lipid panel  Colon cancer screening Agreeable.  -     Ambulatory referral to Gastroenterology  Bilateral eye complaint Discussed use of Systane tears and starting daily antihistamine. Follow-up if symptoms worsen or persist despite treatment.  Other orders -     pravastatin (PRAVACHOL) 80 MG tablet; Take 1 tablet (80 mg total) by mouth daily.    . Reviewed expectations re: course of current medical issues. . Discussed self-management of symptoms. . Outlined signs and symptoms indicating need for more acute intervention. . Patient verbalized understanding and all questions were answered. . See orders for this visit as documented in the electronic medical record. . Patient received an After Visit Summary.  CMA or LPN served as scribe during this visit. History, Physical, and Plan performed by medical provider. Documentation and orders reviewed and attested to.  Inda Coke,  PA-C [facility] 06/10/2017  Follow-up: Return in about 3 months (around 09/10/2017) for hyperlipidemia f/u.

## 2017-06-10 NOTE — Patient Instructions (Addendum)
It was great to see you.  You will be contacted about your colonoscopy.  I recommend the two products that are highlighted on the sheet --> daily claritin and systane balance drops.

## 2017-06-11 DIAGNOSIS — D508 Other iron deficiency anemias: Secondary | ICD-10-CM | POA: Diagnosis not present

## 2017-06-11 DIAGNOSIS — N2581 Secondary hyperparathyroidism of renal origin: Secondary | ICD-10-CM | POA: Diagnosis not present

## 2017-06-11 DIAGNOSIS — E876 Hypokalemia: Secondary | ICD-10-CM | POA: Diagnosis not present

## 2017-06-11 DIAGNOSIS — D649 Anemia, unspecified: Secondary | ICD-10-CM | POA: Diagnosis not present

## 2017-06-11 DIAGNOSIS — N186 End stage renal disease: Secondary | ICD-10-CM | POA: Diagnosis not present

## 2017-06-11 LAB — LIPID PANEL
CHOL/HDL RATIO: 3
Cholesterol: 162 mg/dL (ref 0–200)
HDL: 56 mg/dL (ref >39.00)
LDL Cholesterol: 83 mg/dL (ref 0–99)
NONHDL: 105.99
Triglycerides: 117 mg/dL (ref 0.0–149.0)
VLDL: 23.4 mg/dL (ref 0.0–40.0)

## 2017-06-13 DIAGNOSIS — E876 Hypokalemia: Secondary | ICD-10-CM | POA: Diagnosis not present

## 2017-06-13 DIAGNOSIS — D649 Anemia, unspecified: Secondary | ICD-10-CM | POA: Diagnosis not present

## 2017-06-13 DIAGNOSIS — N2581 Secondary hyperparathyroidism of renal origin: Secondary | ICD-10-CM | POA: Diagnosis not present

## 2017-06-13 DIAGNOSIS — N186 End stage renal disease: Secondary | ICD-10-CM | POA: Diagnosis not present

## 2017-06-13 DIAGNOSIS — D508 Other iron deficiency anemias: Secondary | ICD-10-CM | POA: Diagnosis not present

## 2017-06-16 DIAGNOSIS — E876 Hypokalemia: Secondary | ICD-10-CM | POA: Diagnosis not present

## 2017-06-16 DIAGNOSIS — D649 Anemia, unspecified: Secondary | ICD-10-CM | POA: Diagnosis not present

## 2017-06-16 DIAGNOSIS — N186 End stage renal disease: Secondary | ICD-10-CM | POA: Diagnosis not present

## 2017-06-16 DIAGNOSIS — N2581 Secondary hyperparathyroidism of renal origin: Secondary | ICD-10-CM | POA: Diagnosis not present

## 2017-06-16 DIAGNOSIS — D508 Other iron deficiency anemias: Secondary | ICD-10-CM | POA: Diagnosis not present

## 2017-06-18 DIAGNOSIS — N2581 Secondary hyperparathyroidism of renal origin: Secondary | ICD-10-CM | POA: Diagnosis not present

## 2017-06-18 DIAGNOSIS — N186 End stage renal disease: Secondary | ICD-10-CM | POA: Diagnosis not present

## 2017-06-18 DIAGNOSIS — D508 Other iron deficiency anemias: Secondary | ICD-10-CM | POA: Diagnosis not present

## 2017-06-18 DIAGNOSIS — E876 Hypokalemia: Secondary | ICD-10-CM | POA: Diagnosis not present

## 2017-06-18 DIAGNOSIS — D649 Anemia, unspecified: Secondary | ICD-10-CM | POA: Diagnosis not present

## 2017-06-20 DIAGNOSIS — E876 Hypokalemia: Secondary | ICD-10-CM | POA: Diagnosis not present

## 2017-06-20 DIAGNOSIS — D508 Other iron deficiency anemias: Secondary | ICD-10-CM | POA: Diagnosis not present

## 2017-06-20 DIAGNOSIS — N2581 Secondary hyperparathyroidism of renal origin: Secondary | ICD-10-CM | POA: Diagnosis not present

## 2017-06-20 DIAGNOSIS — D649 Anemia, unspecified: Secondary | ICD-10-CM | POA: Diagnosis not present

## 2017-06-20 DIAGNOSIS — N186 End stage renal disease: Secondary | ICD-10-CM | POA: Diagnosis not present

## 2017-06-22 DIAGNOSIS — T8612 Kidney transplant failure: Secondary | ICD-10-CM | POA: Diagnosis not present

## 2017-06-22 DIAGNOSIS — Z992 Dependence on renal dialysis: Secondary | ICD-10-CM | POA: Diagnosis not present

## 2017-06-22 DIAGNOSIS — N186 End stage renal disease: Secondary | ICD-10-CM | POA: Diagnosis not present

## 2017-06-23 DIAGNOSIS — N186 End stage renal disease: Secondary | ICD-10-CM | POA: Diagnosis not present

## 2017-06-23 DIAGNOSIS — N2581 Secondary hyperparathyroidism of renal origin: Secondary | ICD-10-CM | POA: Diagnosis not present

## 2017-06-23 DIAGNOSIS — D649 Anemia, unspecified: Secondary | ICD-10-CM | POA: Diagnosis not present

## 2017-06-23 DIAGNOSIS — E876 Hypokalemia: Secondary | ICD-10-CM | POA: Diagnosis not present

## 2017-06-23 DIAGNOSIS — D508 Other iron deficiency anemias: Secondary | ICD-10-CM | POA: Diagnosis not present

## 2017-06-25 DIAGNOSIS — E876 Hypokalemia: Secondary | ICD-10-CM | POA: Diagnosis not present

## 2017-06-25 DIAGNOSIS — D508 Other iron deficiency anemias: Secondary | ICD-10-CM | POA: Diagnosis not present

## 2017-06-25 DIAGNOSIS — N186 End stage renal disease: Secondary | ICD-10-CM | POA: Diagnosis not present

## 2017-06-25 DIAGNOSIS — N2581 Secondary hyperparathyroidism of renal origin: Secondary | ICD-10-CM | POA: Diagnosis not present

## 2017-06-25 DIAGNOSIS — D649 Anemia, unspecified: Secondary | ICD-10-CM | POA: Diagnosis not present

## 2017-06-27 DIAGNOSIS — D649 Anemia, unspecified: Secondary | ICD-10-CM | POA: Diagnosis not present

## 2017-06-27 DIAGNOSIS — N2581 Secondary hyperparathyroidism of renal origin: Secondary | ICD-10-CM | POA: Diagnosis not present

## 2017-06-27 DIAGNOSIS — D508 Other iron deficiency anemias: Secondary | ICD-10-CM | POA: Diagnosis not present

## 2017-06-27 DIAGNOSIS — N186 End stage renal disease: Secondary | ICD-10-CM | POA: Diagnosis not present

## 2017-06-27 DIAGNOSIS — E876 Hypokalemia: Secondary | ICD-10-CM | POA: Diagnosis not present

## 2017-06-30 DIAGNOSIS — N186 End stage renal disease: Secondary | ICD-10-CM | POA: Diagnosis not present

## 2017-06-30 DIAGNOSIS — N2581 Secondary hyperparathyroidism of renal origin: Secondary | ICD-10-CM | POA: Diagnosis not present

## 2017-06-30 DIAGNOSIS — D649 Anemia, unspecified: Secondary | ICD-10-CM | POA: Diagnosis not present

## 2017-06-30 DIAGNOSIS — E876 Hypokalemia: Secondary | ICD-10-CM | POA: Diagnosis not present

## 2017-06-30 DIAGNOSIS — D508 Other iron deficiency anemias: Secondary | ICD-10-CM | POA: Diagnosis not present

## 2017-07-02 DIAGNOSIS — N186 End stage renal disease: Secondary | ICD-10-CM | POA: Diagnosis not present

## 2017-07-02 DIAGNOSIS — D508 Other iron deficiency anemias: Secondary | ICD-10-CM | POA: Diagnosis not present

## 2017-07-02 DIAGNOSIS — N2581 Secondary hyperparathyroidism of renal origin: Secondary | ICD-10-CM | POA: Diagnosis not present

## 2017-07-02 DIAGNOSIS — D649 Anemia, unspecified: Secondary | ICD-10-CM | POA: Diagnosis not present

## 2017-07-02 DIAGNOSIS — E876 Hypokalemia: Secondary | ICD-10-CM | POA: Diagnosis not present

## 2017-07-04 DIAGNOSIS — N186 End stage renal disease: Secondary | ICD-10-CM | POA: Diagnosis not present

## 2017-07-04 DIAGNOSIS — N2581 Secondary hyperparathyroidism of renal origin: Secondary | ICD-10-CM | POA: Diagnosis not present

## 2017-07-04 DIAGNOSIS — D649 Anemia, unspecified: Secondary | ICD-10-CM | POA: Diagnosis not present

## 2017-07-04 DIAGNOSIS — E876 Hypokalemia: Secondary | ICD-10-CM | POA: Diagnosis not present

## 2017-07-04 DIAGNOSIS — D508 Other iron deficiency anemias: Secondary | ICD-10-CM | POA: Diagnosis not present

## 2017-07-06 ENCOUNTER — Ambulatory Visit: Payer: Self-pay

## 2017-07-06 NOTE — Telephone Encounter (Signed)
Pt. called to report intermittent pain in right lower quad of abdomen, and into the right mid thigh.  Stated the discomfort in the right thigh is hard to describe; "like the skin is more sensitive."  Reported "the right LQ of abd. is sore to touch, and I can feel a lump, about 1/2 size of an egg."  Stated he feels an intermittent sharp pain in same area. Noted the lump is not palpable when he stands up.  Reported it was hard to sleep last night due to the discomfort. Stated since he has been out doing errands and moving about, the discomfort is not as bad.  Denied any fever/ chills.  Denied nausea or vomiting.  Stated has "some abdominal bloating."  Reported has been having frequent small stools.  Is on Hemodialysis.  Reported smaller amts of urine output.  Noted steaks of BRB in urine; stated "not alot."  Reported hx of stent in his right kidney, and has had streaks of blood in urine 2 times prior; was advised this is related to the stent in the kidney.   Per protocol, advised that it is recommended to go to the ER.  Pt. Stated he already has an appt. With the PA tomorrow.  Does not feel he needs to go to the ER tonight.  Did agree to go to the ER if his symptoms worsen tonight.  Care advice given.  Verb. Understanding.         Reason for Disposition . [1] MILD-MODERATE pain AND [2] constant AND [3] present > 2 hours  Answer Assessment - Initial Assessment Questions 1. LOCATION: "Where does it hurt?"      Pain in lower abdomen, and right thigh 2. RADIATION: "Does the pain shoot anywhere else?" (e.g., chest, back)     Radiates to right thigh  3. ONSET: "When did the pain begin?" (Minutes, hours or days ago)      Started 2 days ago;  4. SUDDEN: "Gradual or sudden onset?"     Gradual  5. PATTERN "Does the pain come and go, or is it constant?"    - If constant: "Is it getting better, staying the same, or worsening?"      (Note: Constant means the pain never goes away completely; most serious pain is  constant and it progresses)     - If intermittent: "How long does it last?" "Do you have pain now?"     (Note: Intermittent means the pain goes away completely between bouts)     Soreness in the abdomen and upper right thigh when touching;  Also intermittent sharp pain in same area  6. SEVERITY: "How bad is the pain?"  (e.g., Scale 1-10; mild, moderate, or severe)    - MILD (1-3): doesn't interfere with normal activities, abdomen soft and not tender to touch     - MODERATE (4-7): interferes with normal activities or awakens from sleep, tender to touch     - SEVERE (8-10): excruciating pain, doubled over, unable to do any normal activities       5/10 and "achy"; with intermittent shooting pain  7. RECURRENT SYMPTOM: "Have you ever had this type of abdominal pain before?" If so, ask: "When was the last time?" and "What happened that time?"      No  8. CAUSE: "What do you think is causing the abdominal pain?"    unknown 9. RELIEVING/AGGRAVATING FACTORS: "What makes it better or worse?" (e.g., movement, antacids, bowel movement)     Elastic  Band in underwear @ waist or around leg ; increases with cough 10. OTHER SYMPTOMS: "Has there been any vomiting, diarrhea, constipation, or urine problems?"       Sees streaks of BRB in urine for a couple of days; some abdominal bloating;  Feels discomfort/ lump with laying down about size of an 1/2 egg,  denied fever/ chills; having freq. small BM's, denied n/v  Protocols used: ABDOMINAL PAIN - MALE-A-AH

## 2017-07-07 ENCOUNTER — Encounter: Payer: Self-pay | Admitting: Physician Assistant

## 2017-07-07 ENCOUNTER — Ambulatory Visit (INDEPENDENT_AMBULATORY_CARE_PROVIDER_SITE_OTHER): Payer: Medicare Other | Admitting: Physician Assistant

## 2017-07-07 ENCOUNTER — Ambulatory Visit (INDEPENDENT_AMBULATORY_CARE_PROVIDER_SITE_OTHER)
Admission: RE | Admit: 2017-07-07 | Discharge: 2017-07-07 | Disposition: A | Discharge status: Home / Self Care | Payer: Medicare Other | Source: Ambulatory Visit | Attending: Physician Assistant | Admitting: Physician Assistant

## 2017-07-07 VITALS — BP 134/78 | HR 77 | Temp 97.9°F | Ht 70.0 in | Wt 173.4 lb

## 2017-07-07 DIAGNOSIS — R1031 Right lower quadrant pain: Secondary | ICD-10-CM

## 2017-07-07 DIAGNOSIS — R319 Hematuria, unspecified: Secondary | ICD-10-CM

## 2017-07-07 DIAGNOSIS — K4090 Unilateral inguinal hernia, without obstruction or gangrene, not specified as recurrent: Secondary | ICD-10-CM | POA: Diagnosis not present

## 2017-07-07 DIAGNOSIS — N186 End stage renal disease: Secondary | ICD-10-CM | POA: Diagnosis not present

## 2017-07-07 DIAGNOSIS — D508 Other iron deficiency anemias: Secondary | ICD-10-CM | POA: Diagnosis not present

## 2017-07-07 DIAGNOSIS — N2581 Secondary hyperparathyroidism of renal origin: Secondary | ICD-10-CM | POA: Diagnosis not present

## 2017-07-07 DIAGNOSIS — E876 Hypokalemia: Secondary | ICD-10-CM | POA: Diagnosis not present

## 2017-07-07 DIAGNOSIS — D649 Anemia, unspecified: Secondary | ICD-10-CM | POA: Diagnosis not present

## 2017-07-07 NOTE — Patient Instructions (Signed)
It was great to see you!  Please get your abdominal CT as we discussed.  If you have any change in symptoms  -- go to the ER.  Abdominal Pain, Adult Contact a doctor if:  Your belly pain changes or gets worse.  You are not hungry, or you lose weight without trying.  You are having trouble pooping (constipated) or have watery poop (diarrhea) for more than 2-3 days.  You have pain when you pee or poop.  Your belly pain wakes you up at night.  Your pain gets worse with meals, after eating, or with certain foods.  You are throwing up and cannot keep anything down.  You have a fever. Get help right away if:  Your pain does not go away as soon as your doctor says it should.  You cannot stop throwing up.  Your pain is only in areas of your belly, such as the right side or the left lower part of the belly.  You have bloody or black poop, or poop that looks like tar.  You have very bad pain, cramping, or bloating in your belly.  You have signs of not having enough fluid or water in your body (dehydration), such as: ? Dark pee, very little pee, or no pee. ? Cracked lips. ? Dry mouth. ? Sunken eyes. ? Sleepiness. ? Weakness.

## 2017-07-07 NOTE — Telephone Encounter (Signed)
Patient was seen in the office today

## 2017-07-07 NOTE — Progress Notes (Signed)
Ryan [name] is a 57 y.o. male here for a new problem.   History of Present Illness:   Chief Complaint  Patient presents with  . Acute Visit    HPI  Patient reports intermittent pain in RLQ abdomen and into the right mid thigh, starting yesterday. Yesterday he was able to feel a lump "about half the size of an egg" in his R groin area. Pain was rated at 5/10. Lump was not palpable when he stood up. He is unable to feel lump at all now. States that his skin is more sensitive in this area. Applied heat to the area last night and the pain improved. Now pain is 1/10. Denies n/v, fever/chills. Is having bloating and small, frequent small stools. Denies blood in stools. Has been coughing more recently and does feel as though his abdominal pain was triggered by that. Also endorses blood in urine. He has a chronic history of this, and states that since Jan his total UOP has decreased, making the blood look more pronounced.   Goes to HD on TTS. Went this AM. Had a bagel afterwards, tolerated well.  Is having bright red blood in his urine. Amount of urine has decreased over the past few months, since January.  He has talked to his HD providers about his dry weight and now having cramping with each treatment. Feels like his HD treatments are getting worse -- getting HA and a lot of fatigue afterwards. Wt Readings from Last 5 Encounters:  07/07/17 173 lb 6.4 oz (78.7 kg)  06/10/17 174 lb 9.6 oz (79.2 kg)  06/04/17 182 lb (82.6 kg)  01/14/17 184 lb (83.5 kg)  12/15/16 185 lb (83.9 kg)     Past Medical History:  Diagnosis Date  . Blood transfusion without reported diagnosis   . ESRD (end stage renal disease) ([facility])      Social History   Socioeconomic History  . Marital status: Divorced    Spouse name: Not on file  . Number of children: Not on file  . Years of education: Not on file  . Highest education level: Not on file  Occupational History  . Not on file  Social Needs  .  Financial resource strain: Not on file  . Food insecurity:    Worry: Not on file    Inability: Not on file  . Transportation needs:    Medical: Not on file    Non-medical: Not on file  Tobacco Use  . Smoking status: Never Smoker  . Smokeless tobacco: Never Used  Substance and Sexual Activity  . Alcohol use: No  . Drug use: No  . Sexual activity: Never  Lifestyle  . Physical activity:    Days per week: Not on file    Minutes per session: Not on file  . Stress: Not on file  Relationships  . Social connections:    Talks on phone: Not on file    Gets together: Not on file    Attends religious service: Not on file    Active member of club or organization: Not on file    Attends meetings of clubs or organizations: Not on file    Relationship status: Not on file  . Intimate partner violence:    Fear of current or ex partner: Not on file    Emotionally abused: Not on file    Physically abused: Not on file    Forced sexual activity: Not on file  Other Topics Concern  . Not on  file  Social History Narrative   Divorced   Does Chiropodist work   Son in nursing school at [employer]    Past Surgical History:  Procedure Laterality Date  . A/V FISTULAGRAM Left 12/15/2016   Procedure: A/V Fistulagram - left;  Surgeon: Angelia Mould, MD;  Location: [place] [facility];  Service: Cardiovascular;  Laterality: Left;  . AV FISTULA PLACEMENT Left 08/28/2016   Procedure: LEFT UPPER  ARM ARTERIOVENOUS (AV) FISTULA CREATION;  Surgeon: Angelia Mould, MD;  Location: [place];  Service: Vascular;  Laterality: Left;  . KIDNEY TRANSPLANT      Family History  Problem Relation Age of Onset  . Hypertension Mother   . Heart disease Mother   . Stroke Mother   . Cancer Father   . Kidney cancer Father   . Hypertension Sister   . Heart disease Maternal Grandmother   . Alcohol abuse Maternal Grandfather   . Mental illness Paternal Grandmother   . Learning disabilities Paternal  Grandmother        Alzheimer's   . Stroke Paternal Grandfather     Allergies  Allergen Reactions  . No Known Allergies     Current Medications:   Current Outpatient Medications:  .  cloNIDine (CATAPRES) 0.2 MG tablet, Take 0.1 mg by mouth 2 (two) times daily., Disp: , Rfl: 1 .  enalapril (VASOTEC) 20 MG tablet, Take 20 mg by mouth 2 (two) times daily., Disp: , Rfl:  .  furosemide (LASIX) 80 MG tablet, TAKE 1 TABLET (80 MG TOTAL) BY MOUTH 2 TIMES DAILY., Disp: , Rfl: 5 .  labetalol (NORMODYNE) 200 MG tablet, Take 200 mg by mouth 2 (two) times daily. , Disp: , Rfl:  .  loratadine (CLARITIN) 10 MG tablet, Take 10 mg by mouth daily as needed for allergies., Disp: , Rfl:  .  multivitamin (RENA-VIT) TABS tablet, Take 1 tablet by mouth daily., Disp: , Rfl:  .  mycophenolate (MYFORTIC) 360 MG TBEC EC tablet, Take 360 mg by mouth 2 (two) times daily., Disp: , Rfl:  .  NIFEdipine (PROCARDIA XL/ADALAT-CC) 60 MG 24 hr tablet, Take 60 mg by mouth 2 (two) times daily., Disp: , Rfl:  .  Nutritional Supplements (FEEDING SUPPLEMENT, NEPRO CARB STEADY,) LIQD, Take 237 mLs by mouth 3 (three) times daily between meals., Disp: 21 Can, Rfl: 0 .  pravastatin (PRAVACHOL) 80 MG tablet, Take 1 tablet (80 mg total) by mouth daily., Disp: 90 tablet, Rfl: 0 .  predniSONE (DELTASONE) 5 MG tablet, Take 5 mg by mouth daily with breakfast., Disp: , Rfl:  .  RENAGEL 800 MG tablet, Take 800-2,400 mg by mouth See admin instructions. Take 3 tablets with meals & 1 tablet with snacks., Disp: , Rfl: 5 .  tacrolimus (PROGRAF) 5 MG capsule, Take 5 mg by mouth 2 (two) times daily., Disp: , Rfl:    Review of Systems:   ROS  Negative unless otherwise specified per HPI.   Vitals:   Vitals:   07/07/17 1301  BP: 134/78  Pulse: 77  Temp: 97.9 F (36.6 C)  TempSrc: Oral  SpO2: 97%  Weight: 173 lb 6.4 oz (78.7 kg)  Height: 5\' 10"  (1.778 m)     Body mass index is 24.88 kg/m.  Physical Exam:   Physical Exam    Constitutional: He appears well-developed. He is cooperative.  Non-toxic appearance. He does not have a sickly appearance. He does not appear ill. No distress.  Cardiovascular: Normal rate, regular rhythm,  S1 normal, S2 normal, normal heart sounds and normal pulses.  No LE edema  Pulmonary/Chest: Effort normal and breath sounds normal.  Abdominal: Bowel sounds are normal. There is tenderness in the right lower quadrant. There is no rigidity, no rebound and no guarding.    Neurological: He is alert. GCS eye subscore is 4. GCS verbal subscore is 5. GCS motor subscore is 6.  Skin: Skin is warm, dry and intact.  Psychiatric: He has a normal mood and affect. His speech is normal and behavior is normal.  Nursing note and vitals reviewed.   Assessment and Plan:    Jamin was seen today for acute visit.  Diagnoses and all orders for this visit:  ESRD (end stage renal disease) ([place]), Hematuria, unspecified type, and Right lower quadrant abdominal pain Given patient's complex medical history, will obtain stat CT scan. Discussed that if his pain worsens in any way in the meantime or if he develops new symptoms, he is to go to the ER. Patient verbalized understanding. Further treatment based on results of CT scan. -     CT Abdomen Pelvis Wo Contrast; Future    . Reviewed expectations re: course of current medical issues. . Discussed self-management of symptoms. . Outlined signs and symptoms indicating need for more acute intervention. . Patient verbalized understanding and all questions were answered. . See orders for this visit as documented in the electronic medical record. . Patient received an After-Visit Summary.   Inda Coke, PA-C

## 2017-07-08 ENCOUNTER — Inpatient Hospital Stay: Admission: RE | Admit: 2017-07-08 | Payer: Medicare Other | Source: Ambulatory Visit

## 2017-07-08 ENCOUNTER — Telehealth: Payer: Self-pay | Admitting: Physician Assistant

## 2017-07-08 NOTE — Telephone Encounter (Signed)
Please call patient and let him know that I discussed his case with one of the doctors here, Dr. Paulla Fore.  Because patient is having some back pain, he recommends that we go ahead and proceed with MRI to assess for any further cause.  If patient is agreeable, please inform me and I will order this for patient.  Inda Coke PA-C 07/08/2017

## 2017-07-09 DIAGNOSIS — D508 Other iron deficiency anemias: Secondary | ICD-10-CM | POA: Diagnosis not present

## 2017-07-09 DIAGNOSIS — N186 End stage renal disease: Secondary | ICD-10-CM | POA: Diagnosis not present

## 2017-07-09 DIAGNOSIS — E876 Hypokalemia: Secondary | ICD-10-CM | POA: Diagnosis not present

## 2017-07-09 DIAGNOSIS — N2581 Secondary hyperparathyroidism of renal origin: Secondary | ICD-10-CM | POA: Diagnosis not present

## 2017-07-09 DIAGNOSIS — D649 Anemia, unspecified: Secondary | ICD-10-CM | POA: Diagnosis not present

## 2017-07-09 NOTE — Telephone Encounter (Signed)
Left voicemail requesting call back.  

## 2017-07-11 ENCOUNTER — Encounter (HOSPITAL_COMMUNITY): Payer: Self-pay

## 2017-07-11 ENCOUNTER — Emergency Department (HOSPITAL_COMMUNITY)
Admission: EM | Admit: 2017-07-11 | Discharge: 2017-07-12 | Disposition: A | Discharge status: Home / Self Care | Payer: Medicare Other | Attending: Emergency Medicine | Admitting: Emergency Medicine

## 2017-07-11 DIAGNOSIS — R1031 Right lower quadrant pain: Secondary | ICD-10-CM | POA: Insufficient documentation

## 2017-07-11 DIAGNOSIS — Z79899 Other long term (current) drug therapy: Secondary | ICD-10-CM | POA: Diagnosis not present

## 2017-07-11 DIAGNOSIS — N2581 Secondary hyperparathyroidism of renal origin: Secondary | ICD-10-CM | POA: Diagnosis not present

## 2017-07-11 DIAGNOSIS — I120 Hypertensive chronic kidney disease with stage 5 chronic kidney disease or end stage renal disease: Secondary | ICD-10-CM | POA: Diagnosis not present

## 2017-07-11 DIAGNOSIS — D649 Anemia, unspecified: Secondary | ICD-10-CM | POA: Diagnosis not present

## 2017-07-11 DIAGNOSIS — Z940 Kidney transplant status: Secondary | ICD-10-CM | POA: Diagnosis not present

## 2017-07-11 DIAGNOSIS — D508 Other iron deficiency anemias: Secondary | ICD-10-CM | POA: Diagnosis not present

## 2017-07-11 DIAGNOSIS — E876 Hypokalemia: Secondary | ICD-10-CM | POA: Diagnosis not present

## 2017-07-11 DIAGNOSIS — N186 End stage renal disease: Secondary | ICD-10-CM | POA: Diagnosis not present

## 2017-07-11 NOTE — ED Triage Notes (Signed)
Pt states that he has been having abd pain for the past week, hematuria, saw PCP and they did a CT scan and symptoms resolved temporarily, pt states pain has returned in abdomen, and R thigh area

## 2017-07-12 ENCOUNTER — Other Ambulatory Visit: Payer: Self-pay

## 2017-07-12 LAB — CBC
HEMATOCRIT: 39.1 % (ref 39.0–52.0)
HEMOGLOBIN: 11.6 g/dL — AB (ref 13.0–17.0)
MCH: 23.6 pg — AB (ref 26.0–34.0)
MCHC: 29.7 g/dL — AB (ref 30.0–36.0)
MCV: 79.5 fL (ref 78.0–100.0)
Platelets: 382 10*3/uL (ref 150–400)
RBC: 4.92 MIL/uL (ref 4.22–5.81)
RDW: 17.9 % — ABNORMAL HIGH (ref 11.5–15.5)
WBC: 8.4 10*3/uL (ref 4.0–10.5)

## 2017-07-12 LAB — URINALYSIS, MICROSCOPIC (REFLEX)

## 2017-07-12 LAB — URINALYSIS, ROUTINE W REFLEX MICROSCOPIC

## 2017-07-12 LAB — COMPREHENSIVE METABOLIC PANEL
ALBUMIN: 2.9 g/dL — AB (ref 3.5–5.0)
ALT: 21 U/L (ref 17–63)
ANION GAP: 13 (ref 5–15)
AST: 19 U/L (ref 15–41)
Alkaline Phosphatase: 86 U/L (ref 38–126)
BUN: 31 mg/dL — ABNORMAL HIGH (ref 6–20)
CO2: 25 mmol/L (ref 22–32)
Calcium: 7.9 mg/dL — ABNORMAL LOW (ref 8.9–10.3)
Chloride: 98 mmol/L — ABNORMAL LOW (ref 101–111)
Creatinine, Ser: 7.32 mg/dL — ABNORMAL HIGH (ref 0.61–1.24)
GFR calc Af Amer: 9 mL/min — ABNORMAL LOW (ref >60)
GFR calc non Af Amer: 7 mL/min — ABNORMAL LOW (ref >60)
GLUCOSE: 100 mg/dL — AB (ref 65–99)
POTASSIUM: 3.5 mmol/L (ref 3.5–5.1)
SODIUM: 136 mmol/L (ref 135–145)
Total Bilirubin: 0.4 mg/dL (ref 0.3–1.2)
Total Protein: 6.8 g/dL (ref 6.5–8.1)

## 2017-07-12 LAB — LIPASE, BLOOD: Lipase: 31 U/L (ref 11–51)

## 2017-07-12 NOTE — ED Notes (Signed)
Per lab, urine spilled in bag and they are unable to use sample.

## 2017-07-12 NOTE — ED Notes (Signed)
Patient verbalizes understanding of discharge instructions. Opportunity for questioning and answers were provided. Armband removed by staff, pt discharged from ED.  

## 2017-07-12 NOTE — Discharge Instructions (Addendum)
See your Physician for recheck.   You appear to have a hernia at your incision site.

## 2017-07-12 NOTE — ED Provider Notes (Signed)
[facility] EMERGENCY DEPARTMENT Provider Note   CSN: [id number] Arrival date & time: 07/11/17  2340     History   Chief Complaint Chief Complaint  Patient presents with  . Abdominal Pain    HPI Ryan [name] is a 57 y.o. male.  The history is provided by the patient. No language interpreter was used.  Abdominal Pain   This is a new problem. The current episode started more than 1 week ago. The problem occurs constantly. The problem has not changed since onset.The pain is associated with an unknown factor. The pain is located in the RLQ. The pain is moderate. Pertinent negatives include fever. Nothing aggravates the symptoms. Nothing relieves the symptoms. Past workup does not include GI consult. His past medical history does not include irritable bowel syndrome.  Pt complains of pain in his right lower abdomen.   Pt has a scar at area fron renal transplant.  Pt reports pain goes down into his right leg.    Past Medical History:  Diagnosis Date  . Blood transfusion without reported diagnosis   . ESRD (end stage renal disease) [facility])     Patient Active Problem List   Diagnosis Date Noted  . Renal transplant, status post 08/26/2016  . Essential hypertension 08/26/2016  . ESRD (end stage renal disease) ([place]) 08/26/2016  . Anemia 04/09/2016  . CKD (chronic kidney disease) 03/31/2016  . EKG abnormalities   . 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 A/V FISTULAGRAM Left 12/15/2016   Procedure: A/V Fistulagram - left;  Surgeon: Angelia Mould, MD;  Location: [place] [facility];  Service: Cardiovascular;  Laterality: Left;  . AV FISTULA PLACEMENT Left 08/28/2016   Procedure: LEFT UPPER  ARM ARTERIOVENOUS (AV) FISTULA CREATION;  Surgeon: Angelia Mould, MD;  Location: [place];  Service: Vascular;  Laterality: Left;  . KIDNEY TRANSPLANT          Home Medications    Prior to  Admission medications   Medication Sig Start Date End Date Taking? Authorizing Provider  cloNIDine (CATAPRES) 0.2 MG tablet Take 0.1 mg by mouth 2 (two) times daily. 12/01/16  Yes [provider]  enalapril (VASOTEC) 20 MG tablet Take 20 mg by mouth 2 (two) times daily.   Yes [provider]  furosemide (LASIX) 80 MG tablet TAKE 1 TABLET (80 MG TOTAL) BY MOUTH 2 TIMES DAILY. 08/28/16  Yes [provider]  labetalol (NORMODYNE) 200 MG tablet Take 200 mg by mouth 2 (two) times daily.    Yes [provider]  loratadine (CLARITIN) 10 MG tablet Take 10 mg by mouth daily as needed for allergies.   Yes [provider]  multivitamin (RENA-VIT) TABS tablet Take 1 tablet by mouth daily.   Yes [provider]  mycophenolate (MYFORTIC) 360 MG TBEC EC tablet Take 360 mg by mouth 2 (two) times daily.   Yes [provider]  NIFEdipine (PROCARDIA XL/ADALAT-CC) 60 MG 24 hr tablet Take 60 mg by mouth 2 (two) times daily.   Yes [provider]  Nutritional Supplements (FEEDING SUPPLEMENT, NEPRO CARB STEADY,) LIQD Take 237 mLs by mouth 3 (three) times daily between meals. 08/28/16  Yes Lavina Hamman, MD  pravastatin (PRAVACHOL) 80 MG tablet Take 1 tablet (80 mg total) by mouth daily. 06/10/17  Yes Inda Coke, PA  predniSONE (DELTASONE) 5 MG tablet Take 5 mg by mouth daily with breakfast.   Yes  [provider]  RENAGEL 800 MG tablet Take 800-2,400 mg by mouth See admin instructions. Take 3 tablets with meals & 1 tablet with snacks. 12/01/16  Yes [provider]  SENSIPAR 30 MG tablet Take 30 mg by mouth daily. 05/22/17  Yes [provider]  tacrolimus (PROGRAF) 5 MG capsule Take 5 mg by mouth 2 (two) times daily.   Yes [provider]    Family History Family History  Problem Relation Age of Onset  . Hypertension Mother   . Heart disease Mother   . Stroke Mother   . Cancer Father   . Kidney cancer Father     . Hypertension Sister   . Heart disease Maternal Grandmother   . Alcohol abuse Maternal Grandfather   . Mental illness Paternal Grandmother   . Learning disabilities Paternal Grandmother        Alzheimer's   . Stroke Paternal Grandfather     Social History Social History   Tobacco Use  . Smoking status: Never Smoker  . Smokeless tobacco: Never Used  Substance Use Topics  . Alcohol use: No  . Drug use: No     Allergies   No known allergies   Review of Systems Review of Systems  Constitutional: Negative for fever.  Gastrointestinal: Positive for abdominal pain.  All other systems reviewed and are negative.    Physical Exam Updated Vital Signs BP (!) 169/108 (BP Location: Right Arm)   Pulse 87   Temp 98.6 F (37 C) (Oral)   Resp 17   Ht 5\' 10"  (1.778 m)   Wt 78.9 kg (174 lb)   SpO2 96%   BMI 24.97 kg/m   Physical Exam  Constitutional: He appears well-developed and well-nourished.  HENT:  Head: Normocephalic and atraumatic.  Eyes: Pupils are equal, round, and reactive to light. Conjunctivae are normal.  Neck: Neck supple.  Cardiovascular: Normal rate and regular rhythm.  No murmur heard. Pulmonary/Chest: Effort normal and breath sounds normal. No respiratory distress.  Abdominal: Soft. Bowel sounds are normal. There is tenderness in the right lower quadrant.  Scar right lower abdomen,  Tender to palpation,    Musculoskeletal: He exhibits no edema.  Neurological: He is alert.  Skin: Skin is warm and dry.  Psychiatric: He has a normal mood and affect.  Nursing note and vitals reviewed.    ED Treatments / Results  Labs (all labs ordered are listed, but only abnormal results are displayed) Labs Reviewed  COMPREHENSIVE METABOLIC PANEL - Abnormal; Notable for the following components:      Result Value   Chloride 98 (*)    Glucose, Bld 100 (*)    BUN 31 (*)    Creatinine, Ser 7.32 (*)    Calcium 7.9 (*)    Albumin 2.9 (*)    GFR calc non Af Amer 7  (*)    GFR calc Af Amer 9 (*)    All other components within normal limits  CBC - Abnormal; Notable for the following components:   Hemoglobin 11.6 (*)    MCH 23.6 (*)    MCHC 29.7 (*)    RDW 17.9 (*)    All other components within normal limits  URINALYSIS, ROUTINE W REFLEX MICROSCOPIC - Abnormal; Notable for the following components:   Color, Urine RED (*)    APPearance CLOUDY (*)    Glucose, UA   (*)    Value: TEST NOT REPORTED DUE TO COLOR INTERFERENCE OF URINE PIGMENT   Hgb urine  dipstick   (*)    Value: TEST NOT REPORTED DUE TO COLOR INTERFERENCE OF URINE PIGMENT   Bilirubin Urine   (*)    Value: TEST NOT REPORTED DUE TO COLOR INTERFERENCE OF URINE PIGMENT   Ketones, ur   (*)    Value: TEST NOT REPORTED DUE TO COLOR INTERFERENCE OF URINE PIGMENT   Protein, ur   (*)    Value: TEST NOT REPORTED DUE TO COLOR INTERFERENCE OF URINE PIGMENT   Nitrite   (*)    Value: TEST NOT REPORTED DUE TO COLOR INTERFERENCE OF URINE PIGMENT   Leukocytes, UA   (*)    Value: TEST NOT REPORTED DUE TO COLOR INTERFERENCE OF URINE PIGMENT   All other components within normal limits  URINALYSIS, MICROSCOPIC (REFLEX) - Abnormal; Notable for the following components:   Bacteria, UA RARE (*)    Squamous Epithelial / LPF 0-5 (*)    All other components within normal limits  LIPASE, BLOOD    EKG None  Radiology No results found.  Procedures Procedures (including critical care time)  Medications Ordered in ED Medications - No data to display   Initial Impression / Assessment and Plan / ED Course  I have reviewed the triage vital signs and the nursing notes.  Pertinent labs & imaging results that were available during my care of the patient were reviewed by me and considered in my medical decision making (see chart for details).    MDM  Labs reviewed  Ct reviewed from primary care visit.  Pt has hernia on left side.  I suspect pain is from muslce strain/hernia on right side.   Dr. Leonette Monarch  in to see and examine pt.    Final Clinical Impressions(s) / ED Diagnoses   Final diagnoses:  Right lower quadrant abdominal pain    ED Discharge Orders    None    An After Visit Summary was printed and given to the patient. Pt advised to follow up with his Md for recheck   Sidney Ace 07/12/17 [place], MD 07/12/17 2037

## 2017-07-13 ENCOUNTER — Other Ambulatory Visit: Payer: Self-pay | Admitting: Physician Assistant

## 2017-07-13 ENCOUNTER — Encounter: Payer: Self-pay | Admitting: Physician Assistant

## 2017-07-13 DIAGNOSIS — M899 Disorder of bone, unspecified: Secondary | ICD-10-CM | POA: Insufficient documentation

## 2017-07-13 DIAGNOSIS — M545 Low back pain: Secondary | ICD-10-CM

## 2017-07-13 NOTE — Telephone Encounter (Signed)
Aldona Bar, see message.

## 2017-07-13 NOTE — Telephone Encounter (Signed)
Spoke with patient and explained result note information.  He is agreeable and would like to proceed with MRI.  Explained that he would be contacted to schedule once order was placed and insurance authorization has been obtained.    FYI:  Patient states he is claustrophobic.

## 2017-07-13 NOTE — Telephone Encounter (Signed)
I have ordered the MRI for this patient.  Ryan [name]

## 2017-07-14 DIAGNOSIS — E876 Hypokalemia: Secondary | ICD-10-CM | POA: Diagnosis not present

## 2017-07-14 DIAGNOSIS — N2581 Secondary hyperparathyroidism of renal origin: Secondary | ICD-10-CM | POA: Diagnosis not present

## 2017-07-14 DIAGNOSIS — D508 Other iron deficiency anemias: Secondary | ICD-10-CM | POA: Diagnosis not present

## 2017-07-14 DIAGNOSIS — D649 Anemia, unspecified: Secondary | ICD-10-CM | POA: Diagnosis not present

## 2017-07-14 DIAGNOSIS — N186 End stage renal disease: Secondary | ICD-10-CM | POA: Diagnosis not present

## 2017-07-16 DIAGNOSIS — D508 Other iron deficiency anemias: Secondary | ICD-10-CM | POA: Diagnosis not present

## 2017-07-16 DIAGNOSIS — D649 Anemia, unspecified: Secondary | ICD-10-CM | POA: Diagnosis not present

## 2017-07-16 DIAGNOSIS — E876 Hypokalemia: Secondary | ICD-10-CM | POA: Diagnosis not present

## 2017-07-16 DIAGNOSIS — N2581 Secondary hyperparathyroidism of renal origin: Secondary | ICD-10-CM | POA: Diagnosis not present

## 2017-07-16 DIAGNOSIS — N186 End stage renal disease: Secondary | ICD-10-CM | POA: Diagnosis not present

## 2017-07-18 DIAGNOSIS — N186 End stage renal disease: Secondary | ICD-10-CM | POA: Diagnosis not present

## 2017-07-18 DIAGNOSIS — D649 Anemia, unspecified: Secondary | ICD-10-CM | POA: Diagnosis not present

## 2017-07-18 DIAGNOSIS — D508 Other iron deficiency anemias: Secondary | ICD-10-CM | POA: Diagnosis not present

## 2017-07-18 DIAGNOSIS — N2581 Secondary hyperparathyroidism of renal origin: Secondary | ICD-10-CM | POA: Diagnosis not present

## 2017-07-18 DIAGNOSIS — E876 Hypokalemia: Secondary | ICD-10-CM | POA: Diagnosis not present

## 2017-07-21 DIAGNOSIS — D649 Anemia, unspecified: Secondary | ICD-10-CM | POA: Diagnosis not present

## 2017-07-21 DIAGNOSIS — N2581 Secondary hyperparathyroidism of renal origin: Secondary | ICD-10-CM | POA: Diagnosis not present

## 2017-07-21 DIAGNOSIS — N186 End stage renal disease: Secondary | ICD-10-CM | POA: Diagnosis not present

## 2017-07-21 DIAGNOSIS — D508 Other iron deficiency anemias: Secondary | ICD-10-CM | POA: Diagnosis not present

## 2017-07-21 DIAGNOSIS — E876 Hypokalemia: Secondary | ICD-10-CM | POA: Diagnosis not present

## 2017-07-22 DIAGNOSIS — N186 End stage renal disease: Secondary | ICD-10-CM | POA: Diagnosis not present

## 2017-07-22 DIAGNOSIS — T82858A Stenosis of vascular prosthetic devices, implants and grafts, initial encounter: Secondary | ICD-10-CM | POA: Diagnosis not present

## 2017-07-22 DIAGNOSIS — Z992 Dependence on renal dialysis: Secondary | ICD-10-CM | POA: Diagnosis not present

## 2017-07-22 DIAGNOSIS — I871 Compression of vein: Secondary | ICD-10-CM | POA: Diagnosis not present

## 2017-07-23 DIAGNOSIS — E8770 Fluid overload, unspecified: Secondary | ICD-10-CM | POA: Diagnosis not present

## 2017-07-23 DIAGNOSIS — D649 Anemia, unspecified: Secondary | ICD-10-CM | POA: Diagnosis not present

## 2017-07-23 DIAGNOSIS — N186 End stage renal disease: Secondary | ICD-10-CM | POA: Diagnosis not present

## 2017-07-23 DIAGNOSIS — E876 Hypokalemia: Secondary | ICD-10-CM | POA: Diagnosis not present

## 2017-07-23 DIAGNOSIS — N2581 Secondary hyperparathyroidism of renal origin: Secondary | ICD-10-CM | POA: Diagnosis not present

## 2017-07-24 ENCOUNTER — Ambulatory Visit (AMBULATORY_SURGERY_CENTER): Payer: Self-pay

## 2017-07-24 ENCOUNTER — Other Ambulatory Visit: Payer: Self-pay

## 2017-07-24 VITALS — Ht 70.0 in | Wt 177.0 lb

## 2017-07-24 DIAGNOSIS — Z1211 Encounter for screening for malignant neoplasm of colon: Secondary | ICD-10-CM

## 2017-07-24 MED ORDER — PEG 3350-KCL-NA BICARB-NACL 420 G PO SOLR
4000.0000 mL | Freq: Once | ORAL | 0 refills | Status: AC
Start: 2017-07-24 — End: 2017-07-24

## 2017-07-24 NOTE — Progress Notes (Signed)
No egg or soy allergy known to patient  No issues with past sedation with any surgeries  or procedures, no intubation problems  No diet pills per patient No home 02 use per patient  No blood thinners per patient  Pt denies issues with constipation  No A fib or A flutter  EMMI video sent to pt's e mail sent doing pv.

## 2017-07-25 DIAGNOSIS — E876 Hypokalemia: Secondary | ICD-10-CM | POA: Diagnosis not present

## 2017-07-25 DIAGNOSIS — E8770 Fluid overload, unspecified: Secondary | ICD-10-CM | POA: Diagnosis not present

## 2017-07-25 DIAGNOSIS — D649 Anemia, unspecified: Secondary | ICD-10-CM | POA: Diagnosis not present

## 2017-07-25 DIAGNOSIS — N186 End stage renal disease: Secondary | ICD-10-CM | POA: Diagnosis not present

## 2017-07-25 DIAGNOSIS — N2581 Secondary hyperparathyroidism of renal origin: Secondary | ICD-10-CM | POA: Diagnosis not present

## 2017-07-28 ENCOUNTER — Encounter: Payer: Self-pay | Admitting: Physician Assistant

## 2017-07-28 ENCOUNTER — Ambulatory Visit (INDEPENDENT_AMBULATORY_CARE_PROVIDER_SITE_OTHER): Payer: Medicare Other | Admitting: Physician Assistant

## 2017-07-28 VITALS — BP 140/78 | HR 82 | Temp 98.7°F | Ht 70.0 in | Wt 176.0 lb

## 2017-07-28 DIAGNOSIS — E876 Hypokalemia: Secondary | ICD-10-CM | POA: Diagnosis not present

## 2017-07-28 DIAGNOSIS — R05 Cough: Secondary | ICD-10-CM | POA: Diagnosis not present

## 2017-07-28 DIAGNOSIS — E8770 Fluid overload, unspecified: Secondary | ICD-10-CM | POA: Diagnosis not present

## 2017-07-28 DIAGNOSIS — N2581 Secondary hyperparathyroidism of renal origin: Secondary | ICD-10-CM | POA: Diagnosis not present

## 2017-07-28 DIAGNOSIS — N186 End stage renal disease: Secondary | ICD-10-CM | POA: Diagnosis not present

## 2017-07-28 DIAGNOSIS — R059 Cough, unspecified: Secondary | ICD-10-CM

## 2017-07-28 DIAGNOSIS — D649 Anemia, unspecified: Secondary | ICD-10-CM | POA: Diagnosis not present

## 2017-07-28 DIAGNOSIS — R0989 Other specified symptoms and signs involving the circulatory and respiratory systems: Secondary | ICD-10-CM

## 2017-07-28 MED ORDER — DOXYCYCLINE HYCLATE 100 MG PO TABS: 100.0000 mg | ORAL_TABLET | Freq: Two times a day (BID) | ORAL | 0 refills | Status: DC

## 2017-07-28 NOTE — Patient Instructions (Signed)
You will be contacted about your referral to cardiology.  If your symptoms worsen or change in the meantime, please seek medical attention.  Start the antibiotic.

## 2017-07-28 NOTE — Progress Notes (Signed)
Ryan [name] is a 57 y.o. male here for a recurrent problem.  I acted as a Education administrator for [employer], PA-C Anselmo Pickler, LPN  History of Present Illness:   Chief Complaint  Patient presents with  . Sinus Problem    Sinus Problem  This is a recurrent problem. Episode onset: Has been going on since March off and on, worse the past week. The problem has been gradually worsening since onset. There has been no fever. Associated symptoms include coughing (expectorating lt pink sputum). Pertinent negatives include no chills, headaches, neck pain, sinus pressure or sore throat. (Runny nose and post nasal drip) Treatments tried: OTC cough syrup. The treatment provided mild relief.   Patient reports ongoing symptoms since March but does note that he had relief when he was given a prescription of doxycycline.   05/18/17 CXR --> cardiomegaly and vascular congestion 06/04/17 CXR --> stable vascular congestion  He reports that he is now coughing up light pink sputum. He is on HD and states that Dr. Lorrene Reid has been talking about challenging his dry weight. Denies unusual SOB or CP.  Wt Readings from Last 5 Encounters:  07/28/17 176 lb (79.8 kg)  07/24/17 177 lb (80.3 kg)  07/12/17 174 lb (78.9 kg)  07/07/17 173 lb 6.4 oz (78.7 kg)  06/10/17 174 lb 9.6 oz (79.2 kg)    Past Medical History:  Diagnosis Date  . Allergy   . Blood transfusion without reported diagnosis   . Dialysis patient ([place])    Tues,thurs,sat  . ESRD (end stage renal disease) ([place])   . Hyperlipidemia   . Hypertension      Social History   Socioeconomic History  . Marital status: Divorced    Spouse name: Not on file  . Number of children: Not on file  . Years of education: Not on file  . Highest education level: Not on file  Occupational History  . Not on file  Social Needs  . Financial resource strain: Not on file  . Food insecurity:    Worry: Not on file    Inability: Not on file  . Transportation  needs:    Medical: Not on file    Non-medical: Not on file  Tobacco Use  . Smoking status: Never Smoker  . Smokeless tobacco: Never Used  Substance and Sexual Activity  . Alcohol use: No  . Drug use: No  . Sexual activity: Never  Lifestyle  . Physical activity:    Days per week: Not on file    Minutes per session: Not on file  . Stress: Not on file  Relationships  . Social connections:    Talks on phone: Not on file    Gets together: Not on file    Attends religious service: Not on file    Active member of club or organization: Not on file    Attends meetings of clubs or organizations: Not on file    Relationship status: Not on file  . Intimate partner violence:    Fear of current or ex partner: Not on file    Emotionally abused: Not on file    Physically abused: Not on file    Forced sexual activity: Not on file  Other Topics Concern  . 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 A/V FISTULAGRAM Left 12/15/2016  Procedure: A/V Fistulagram - left;  Surgeon: Angelia Mould, MD;  Location: [facility];  Service: Cardiovascular;  Laterality: Left;  . AV FISTULA PLACEMENT Left 08/28/2016   Procedure: LEFT UPPER  ARM ARTERIOVENOUS (AV) FISTULA CREATION;  Surgeon: Angelia Mould, MD;  Location: [facility];  Service: Vascular;  Laterality: Left;  . KIDNEY TRANSPLANT    . stent in kidneys     dec 2017    Family History  Problem Relation Age of Onset  . Hypertension Mother   . Heart disease Mother   . Stroke Mother   . Cancer Father   . Kidney cancer Father   . Hypertension Sister   . Heart disease Maternal Grandmother   . Alcohol abuse Maternal Grandfather   . Mental illness Paternal Grandmother   . Learning disabilities Paternal Grandmother        Alzheimer's   . Stroke Paternal Grandfather   . Colon cancer Neg Hx   .  Colon polyps Neg Hx   . Esophageal cancer Neg Hx   . Rectal cancer Neg Hx   . Stomach cancer Neg Hx     Allergies  Allergen Reactions  . No Known Allergies     Current Medications:   Current Outpatient Medications:  .  cloNIDine (CATAPRES) 0.2 MG tablet, Take 0.1 mg by mouth 2 (two) times daily., Disp: , Rfl: 1 .  enalapril (VASOTEC) 20 MG tablet, Take 20 mg by mouth 2 (two) times daily., Disp: , Rfl:  .  furosemide (LASIX) 80 MG tablet, TAKE 1 TABLET (80 MG TOTAL) BY MOUTH 2 TIMES DAILY., Disp: , Rfl: 5 .  labetalol (NORMODYNE) 200 MG tablet, Take 200 mg by mouth 2 (two) times daily. , Disp: , Rfl:  .  loratadine (CLARITIN) 10 MG tablet, Take 10 mg by mouth daily as needed for allergies., Disp: , Rfl:  .  multivitamin (RENA-VIT) TABS tablet, Take 1 tablet by mouth daily., Disp: , Rfl:  .  mycophenolate (MYFORTIC) 360 MG TBEC EC tablet, Take 360 mg by mouth 2 (two) times daily., Disp: , Rfl:  .  NIFEdipine (PROCARDIA XL/ADALAT-CC) 60 MG 24 hr tablet, Take 60 mg by mouth 2 (two) times daily., Disp: , Rfl:  .  Nutritional Supplements (FEEDING SUPPLEMENT, NEPRO CARB STEADY,) LIQD, Take 237 mLs by mouth 3 (three) times daily between meals., Disp: 21 Can, Rfl: 0 .  pravastatin (PRAVACHOL) 80 MG tablet, Take 1 tablet (80 mg total) by mouth daily., Disp: 90 tablet, Rfl: 0 .  predniSONE (DELTASONE) 5 MG tablet, Take 5 mg by mouth daily with breakfast., Disp: , Rfl:  .  RENAGEL 800 MG tablet, Take 800-2,400 mg by mouth See admin instructions. Take 3 tablets with meals & 1 tablet with snacks., Disp: , Rfl: 5 .  SENSIPAR 30 MG tablet, Take 30 mg by mouth daily., Disp: , Rfl: 6 .  tacrolimus (PROGRAF) 5 MG capsule, Take 5 mg by mouth 2 (two) times daily., Disp: , Rfl:  .  doxycycline (VIBRA-TABS) 100 MG tablet, Take 1 tablet (100 mg total) by mouth 2 (two) times daily., Disp: 20 tablet, Rfl: 0   Review of Systems:   Review of Systems  Constitutional: Negative for chills.  HENT: Negative for  sinus pressure and sore throat.   Respiratory: Positive for cough (expectorating lt pink sputum).   Musculoskeletal: Negative for neck pain.  Neurological: Negative for headaches.    Vitals:   Vitals:   07/28/17 1450  BP: 140/78  Pulse: 82  Temp: 98.7 F (37.1 C)  TempSrc: Oral  SpO2: 96%  Weight: 176 lb (79.8 kg)  Height: 5\' 10"  (1.778 m)     Body mass index is 25.25 kg/m.  Physical Exam:   Physical Exam  Constitutional: He appears well-developed. He is cooperative.  Non-toxic appearance. He does not have a sickly appearance. He does not appear ill. No distress.  HENT:  Head: Normocephalic and atraumatic.  Right Ear: Tympanic membrane, external ear and ear canal normal. Tympanic membrane is not erythematous, not retracted and not bulging.  Left Ear: Tympanic membrane, external ear and ear canal normal. Tympanic membrane is not erythematous, not retracted and not bulging.  Nose: Nose normal. Right sinus exhibits no maxillary sinus tenderness and no frontal sinus tenderness. Left sinus exhibits no maxillary sinus tenderness and no frontal sinus tenderness.  Mouth/Throat: Uvula is midline. No posterior oropharyngeal edema or posterior oropharyngeal erythema.  Eyes: Conjunctivae and lids are normal.  Neck: Trachea normal.  Cardiovascular: Normal rate, regular rhythm, S1 normal, S2 normal and normal heart sounds.  Trace edema in bilateral legs   Pulmonary/Chest: Effort normal. He has decreased breath sounds in the right lower field and the left lower field. He has no wheezes. He has no rhonchi. He has no rales.  Lymphadenopathy:    He has no cervical adenopathy.  Neurological: He is alert.  Skin: Skin is warm, dry and intact.  Psychiatric: He has a normal mood and affect. His speech is normal and behavior is normal.  Nursing note and vitals reviewed.   Assessment and Plan:    Ry was seen today for sinus problem.  Diagnoses and all orders for this  visit:  Pulmonary vascular congestion -     Ambulatory referral to Cardiology  Cough -     Ambulatory referral to Cardiology  Other orders -     doxycycline (VIBRA-TABS) 100 MG tablet; Take 1 tablet (100 mg total) by mouth 2 (two) times daily.   Given chronicity of symptoms I recommend that patient be evaluated by a specialist.  Discussed with Dr. Briscoe Deutscher regarding where to send patient, will defer to cardiology at this time given ongoing symptoms suggesting pulmonary edema and vascular congestion.  I will repeat another round of doxycycline.  I recommended that he continue to discuss his symptoms with his nephrologist, Dr. Lorrene Reid, regarding his symptoms. Dry weight/fluid management per Lorrene Reid.  Patient verbalized understanding and is agreeable to plan.  No red flags on my exam today.  . Reviewed expectations re: course of current medical issues. . Discussed self-management of symptoms. . Outlined signs and symptoms indicating need for more acute intervention. . Patient verbalized understanding and all questions were answered. . See orders for this visit as documented in the electronic medical record. . Patient received an After-Visit Summary.  CMA or LPN served as scribe during this visit. History, Physical, and Plan performed by medical provider. Documentation and orders reviewed and attested to.  Inda Coke, PA-C

## 2017-07-30 DIAGNOSIS — N2581 Secondary hyperparathyroidism of renal origin: Secondary | ICD-10-CM | POA: Diagnosis not present

## 2017-07-30 DIAGNOSIS — E876 Hypokalemia: Secondary | ICD-10-CM | POA: Diagnosis not present

## 2017-07-30 DIAGNOSIS — E8770 Fluid overload, unspecified: Secondary | ICD-10-CM | POA: Diagnosis not present

## 2017-07-30 DIAGNOSIS — N186 End stage renal disease: Secondary | ICD-10-CM | POA: Diagnosis not present

## 2017-07-30 DIAGNOSIS — D649 Anemia, unspecified: Secondary | ICD-10-CM | POA: Diagnosis not present

## 2017-07-31 ENCOUNTER — Encounter: Payer: Medicare Other | Admitting: Gastroenterology

## 2017-08-01 DIAGNOSIS — N2581 Secondary hyperparathyroidism of renal origin: Secondary | ICD-10-CM | POA: Diagnosis not present

## 2017-08-01 DIAGNOSIS — N186 End stage renal disease: Secondary | ICD-10-CM | POA: Diagnosis not present

## 2017-08-01 DIAGNOSIS — E8770 Fluid overload, unspecified: Secondary | ICD-10-CM | POA: Diagnosis not present

## 2017-08-01 DIAGNOSIS — E876 Hypokalemia: Secondary | ICD-10-CM | POA: Diagnosis not present

## 2017-08-01 DIAGNOSIS — D649 Anemia, unspecified: Secondary | ICD-10-CM | POA: Diagnosis not present

## 2017-08-03 ENCOUNTER — Encounter: Payer: Self-pay | Admitting: Physician Assistant

## 2017-08-04 DIAGNOSIS — E876 Hypokalemia: Secondary | ICD-10-CM | POA: Diagnosis not present

## 2017-08-04 DIAGNOSIS — D649 Anemia, unspecified: Secondary | ICD-10-CM | POA: Diagnosis not present

## 2017-08-04 DIAGNOSIS — E8770 Fluid overload, unspecified: Secondary | ICD-10-CM | POA: Diagnosis not present

## 2017-08-04 DIAGNOSIS — N2581 Secondary hyperparathyroidism of renal origin: Secondary | ICD-10-CM | POA: Diagnosis not present

## 2017-08-04 DIAGNOSIS — N186 End stage renal disease: Secondary | ICD-10-CM | POA: Diagnosis not present

## 2017-08-06 DIAGNOSIS — E8770 Fluid overload, unspecified: Secondary | ICD-10-CM | POA: Diagnosis not present

## 2017-08-06 DIAGNOSIS — N2581 Secondary hyperparathyroidism of renal origin: Secondary | ICD-10-CM | POA: Diagnosis not present

## 2017-08-06 DIAGNOSIS — D649 Anemia, unspecified: Secondary | ICD-10-CM | POA: Diagnosis not present

## 2017-08-06 DIAGNOSIS — N186 End stage renal disease: Secondary | ICD-10-CM | POA: Diagnosis not present

## 2017-08-06 DIAGNOSIS — E876 Hypokalemia: Secondary | ICD-10-CM | POA: Diagnosis not present

## 2017-08-08 DIAGNOSIS — E876 Hypokalemia: Secondary | ICD-10-CM | POA: Diagnosis not present

## 2017-08-08 DIAGNOSIS — N2581 Secondary hyperparathyroidism of renal origin: Secondary | ICD-10-CM | POA: Diagnosis not present

## 2017-08-08 DIAGNOSIS — E8770 Fluid overload, unspecified: Secondary | ICD-10-CM | POA: Diagnosis not present

## 2017-08-08 DIAGNOSIS — N186 End stage renal disease: Secondary | ICD-10-CM | POA: Diagnosis not present

## 2017-08-08 DIAGNOSIS — D649 Anemia, unspecified: Secondary | ICD-10-CM | POA: Diagnosis not present

## 2017-08-11 DIAGNOSIS — E8770 Fluid overload, unspecified: Secondary | ICD-10-CM | POA: Diagnosis not present

## 2017-08-11 DIAGNOSIS — D649 Anemia, unspecified: Secondary | ICD-10-CM | POA: Diagnosis not present

## 2017-08-11 DIAGNOSIS — N2581 Secondary hyperparathyroidism of renal origin: Secondary | ICD-10-CM | POA: Diagnosis not present

## 2017-08-11 DIAGNOSIS — N186 End stage renal disease: Secondary | ICD-10-CM | POA: Diagnosis not present

## 2017-08-11 DIAGNOSIS — E876 Hypokalemia: Secondary | ICD-10-CM | POA: Diagnosis not present

## 2017-08-13 DIAGNOSIS — D649 Anemia, unspecified: Secondary | ICD-10-CM | POA: Diagnosis not present

## 2017-08-13 DIAGNOSIS — N186 End stage renal disease: Secondary | ICD-10-CM | POA: Diagnosis not present

## 2017-08-13 DIAGNOSIS — N2581 Secondary hyperparathyroidism of renal origin: Secondary | ICD-10-CM | POA: Diagnosis not present

## 2017-08-13 DIAGNOSIS — E876 Hypokalemia: Secondary | ICD-10-CM | POA: Diagnosis not present

## 2017-08-13 DIAGNOSIS — E8770 Fluid overload, unspecified: Secondary | ICD-10-CM | POA: Diagnosis not present

## 2017-08-15 DIAGNOSIS — N186 End stage renal disease: Secondary | ICD-10-CM | POA: Diagnosis not present

## 2017-08-15 DIAGNOSIS — E876 Hypokalemia: Secondary | ICD-10-CM | POA: Diagnosis not present

## 2017-08-15 DIAGNOSIS — E8770 Fluid overload, unspecified: Secondary | ICD-10-CM | POA: Diagnosis not present

## 2017-08-15 DIAGNOSIS — D649 Anemia, unspecified: Secondary | ICD-10-CM | POA: Diagnosis not present

## 2017-08-15 DIAGNOSIS — N2581 Secondary hyperparathyroidism of renal origin: Secondary | ICD-10-CM | POA: Diagnosis not present

## 2017-08-18 DIAGNOSIS — E876 Hypokalemia: Secondary | ICD-10-CM | POA: Diagnosis not present

## 2017-08-18 DIAGNOSIS — E8770 Fluid overload, unspecified: Secondary | ICD-10-CM | POA: Diagnosis not present

## 2017-08-18 DIAGNOSIS — D649 Anemia, unspecified: Secondary | ICD-10-CM | POA: Diagnosis not present

## 2017-08-18 DIAGNOSIS — N186 End stage renal disease: Secondary | ICD-10-CM | POA: Diagnosis not present

## 2017-08-18 DIAGNOSIS — N2581 Secondary hyperparathyroidism of renal origin: Secondary | ICD-10-CM | POA: Diagnosis not present

## 2017-08-19 ENCOUNTER — Ambulatory Visit (INDEPENDENT_AMBULATORY_CARE_PROVIDER_SITE_OTHER): Payer: Medicare Other | Admitting: Family Medicine

## 2017-08-19 ENCOUNTER — Ambulatory Visit: Payer: Medicare Other | Admitting: Physician Assistant

## 2017-08-19 ENCOUNTER — Encounter: Payer: Self-pay | Admitting: Family Medicine

## 2017-08-19 ENCOUNTER — Encounter: Payer: Self-pay | Admitting: Physician Assistant

## 2017-08-19 VITALS — BP 170/90 | HR 96 | Temp 98.3°F | Resp 16 | Ht 70.0 in | Wt 178.8 lb

## 2017-08-19 DIAGNOSIS — N186 End stage renal disease: Secondary | ICD-10-CM | POA: Diagnosis not present

## 2017-08-19 DIAGNOSIS — R05 Cough: Secondary | ICD-10-CM | POA: Diagnosis not present

## 2017-08-19 DIAGNOSIS — T464X5A Adverse effect of angiotensin-converting-enzyme inhibitors, initial encounter: Secondary | ICD-10-CM | POA: Diagnosis not present

## 2017-08-19 DIAGNOSIS — R058 Other specified cough: Secondary | ICD-10-CM

## 2017-08-19 DIAGNOSIS — N2581 Secondary hyperparathyroidism of renal origin: Secondary | ICD-10-CM | POA: Diagnosis not present

## 2017-08-19 DIAGNOSIS — R310 Gross hematuria: Secondary | ICD-10-CM | POA: Diagnosis not present

## 2017-08-19 DIAGNOSIS — R1031 Right lower quadrant pain: Secondary | ICD-10-CM

## 2017-08-19 DIAGNOSIS — E876 Hypokalemia: Secondary | ICD-10-CM | POA: Diagnosis not present

## 2017-08-19 DIAGNOSIS — E8770 Fluid overload, unspecified: Secondary | ICD-10-CM | POA: Diagnosis not present

## 2017-08-19 DIAGNOSIS — D649 Anemia, unspecified: Secondary | ICD-10-CM | POA: Diagnosis not present

## 2017-08-19 MED ORDER — LOSARTAN POTASSIUM 25 MG PO TABS: 25.0000 mg | ORAL_TABLET | Freq: Every day | ORAL | 0 refills | Status: DC

## 2017-08-19 NOTE — Patient Instructions (Signed)
Stopping enlapril due to cough......  Starting losartan 25mg  daily. ASK WHEN YOU GO IF THIS IS OKAY SINCE IT IS NOT DIALYZED. DO NOT WANT TO DROP YOUR PRESSURE. Will need to f/u closely to monitor pressure and adjust medication.   Ask PA about blood in urine and find out who put your stent in place... We need you to follow up with them... I think you may need a kidney ultrasound. See if they will do this if they think you need this.

## 2017-08-19 NOTE — Progress Notes (Signed)
Patient: Ryan [name] MRN: [id number] DOB: Mar 19, 1961 PCP: Inda Coke, PA     Subjective:  Chief Complaint  Patient presents with  . Fatigue    x1 week  . Hematuria    HPI: The patient is a 57 y.o. male who presents today for right lower quadrant pain and worsening hematuria. He was seen in our clinic on 07/07/17 for same complaints. Stat CT was ordered and was insignificant. He then went to ER on 07/12/17 where work up was negative. He is an ESRD patient on HD with hx of kidney transplant. Hematuria is chornic that comes and goes, but this time it has been consistent over the past few months. ER thought it was due to his stent. He saw his kidney doctor yesterday and nothing was done per patient. He is a poor historian who seems to not know much of his medical history.   RLQ pain: started a few months ago. Pain is intermittent. CT done and showed nothing acute. He was supposed to have a MRI done, but couldn't get this done due to he is not able to verify what kind of stent he has placed (metal or not). He has not been able to get a hold of them and his kidney doctor seems to not know either. Pain is not really present today. He feels a knot and it feels hard and it radiates down his right leg. He has not noticed the pain in the last few days. Pain rated as a 3/10 when he has it and is described as sharp, stabbing and dull. "annoying." heating pad seems to make it better. He has not taken any tylenol. Coughing seems to make it worse. No problems with bowel movements. No N/V/D, No blood in stool, no fever/chills.   Hematuria: chronic issue to ESRD with HD/hx of renal transplant in 2010. Renal damage due to uncontrolled BP per patient. He states this is normal and usually comes and goes. Has been going on for a couple of months and thought due to this stent. 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 He  produced urine until January of this year. Shortly after this he stopped making urine and would have intermittent blood. Now he has straight blood and has it every time he "urinates." He has no pain with BM, no fever/chills and prostate was normal on CT. Saw renal doc yesterday who did not really say anything to him. More concerned with his living situation. Labs checked weekly. Getting iron now. He has not been back to [facility] for f/u of his stent. Weight has been stable, but when asked again he states, "I have gained 5 pounds and they stopped dialysis early and I''m not sure why."    Cough: has had a cough "for a long time." thought it was post nasal drip. Has been to ER for this. Not really changed at all. No fever/chills, no hemoptysis, no travel. On ACE-I. CXR done and showed pulmonary vascular congestion that is stable.   Review of Systems  Constitutional: Negative for chills and fever.  Respiratory: Positive for cough. Negative for shortness of breath and wheezing.   Cardiovascular: Positive for leg swelling. Negative for chest pain and palpitations.  Gastrointestinal: Positive for abdominal pain. Negative for blood in stool, diarrhea, nausea and vomiting.  Genitourinary: Positive for decreased urine volume and hematuria. Negative for flank pain, penile pain and urgency.    Allergies Patient is allergic to no  known allergies.  Past Medical History Patient  has a past medical history of Allergy, Blood transfusion without reported diagnosis, Dialysis patient ([place]), ESRD (end stage renal disease) ([place]), Hyperlipidemia, and Hypertension.  Surgical History Patient  has a past surgical history that includes Kidney transplant; AV fistula placement (Left, 08/28/2016); A/V Fistulagram (Left, 12/15/2016); and stent in kidneys.  Family History Pateint's family history includes Alcohol abuse in his maternal grandfather; Cancer in his father; Heart disease in his maternal grandmother and mother;  Hypertension in his mother and sister; Kidney cancer in his father; Learning disabilities in his paternal grandmother; Mental illness in his paternal grandmother; Stroke in his mother and paternal grandfather.  Social History Patient  reports that he has never smoked. He has never used smokeless tobacco. He reports that he does not drink alcohol or use drugs.    Objective: Vitals:   08/19/17 0825  BP: (!) 170/90  Pulse: 96  Resp: 16  Temp: 98.3 F (36.8 C)  TempSrc: Oral  SpO2: 97%  Weight: 178 lb 12.8 oz (81.1 kg)  Height: 5\' 10"  (1.778 m)    Body mass index is 25.66 kg/m.  Physical Exam  Constitutional: He is oriented to person, place, and time. He appears well-developed and well-nourished.  HENT:  Right Ear: External ear normal.  Left Ear: External ear normal.  Mouth/Throat: Oropharynx is clear and moist.  Neck: Normal range of motion. Neck supple. No thyromegaly present.  Cardiovascular: Normal rate, regular rhythm and normal heart sounds.  No murmur heard. 3+ pitting edema bilaterally to above ankle.   Pulmonary/Chest: Effort normal and breath sounds normal. He has no rales.  Abdominal: Soft. Bowel sounds are normal. He exhibits no distension. There is no tenderness.  Large scar in RLQ. Area is hard to palpation  Lymphadenopathy:    He has no cervical adenopathy.  Neurological: He is alert and oriented to person, place, and time.  Skin: Skin is warm and dry. No rash noted.  Psychiatric: He has a normal mood and affect. His behavior is normal.  Vitals reviewed.  Ua: did not collect due to anuric state and frank blood.     Assessment/plan: 1. Hematuria, gross Extremely complex and multi faceted issue with this patient. He is confused and extremely poor compliance with f/u and a poor historian. Called renal doc and spoke to PA who was unsure what they talked about yesterday at his appointment.  She was at the hospital and did not have access to their office computer  system.  She did call back and had spoken with his renal doctor who advised that he head to [facility] ER transplant center. She also states that the patient did not mention anything to them about hematuria.  She also thinks this could be due to multiple issues including but not limited to his stent that was placed in his right kidney or some other issue with the transplant.  He again mentioned his living situation where he was kicked out of his daughter's house whom he was living with and is no living out of his car.  He thinks he is going to move to [place] this week; however, we had a long discussion on what a terrible idea this is as he has no follow-up with any kind of renal doctor or dialysis set up before he moves.  We need to figure out what is going on with his kidneys and why he has frank hematuria before he moves.  I suggested that he  follow his renal doctors advice and go to the ER today to figure out why he has frank blood.  He has clearly been anuric since January.   Advised that he stay in [place] until we figure out what is going on with his kidneys and in the meantime call [place] and see if he can establish care and set up at that dialysis plan before just moving.  His specialists are all here in [place] and know his history and can better treat him before he moves to a place where he has no care. Discussed that this issue could be life threatening with his history and he has to f/u. Also was set up to go back and get dialysis again this afternoon, but will likely be in ER. Must f/u with renal doc and specialist in [place] who placed stent once cleared by transplant team.  - Urine Culture  2. Abdominal pain, RLQ Work-up has been negative thus far.  I would think if it was a muscle strain it would have gotten better since it has been a few months.  Does have a palpable hard place where he is having pain.  But CT was negative I wonder if he needs a renal ultrasound but will let  the specialist to determine this.  MRI has also been ordered but he needs to find out what kind of stent he has placed before this can get done.  Discussed that he needs to call [facility] and schedule an appointment for this to be done so they can move forward with an MRI and he can have close follow-up of his stent to make sure this is not the cause of his hematuria.  He has no idea who his doctor is that place the stent and again discussed I have no records there is nothing in his chart and his kidney physicians PA did not know where he went either.  He needs to find this information out and get in as soon as possible to get this resolved.  3. Cough due to ACE inhibitor Has had a cough for a while now.  Chest x-ray is not very significant.  He has been on an ACE inhibitor for years and years. IM going to stop the enalapril and start him on a very low dose of losartan at 25 mg since it is not hemodialyzed at all.  Discussed that since this drug is not filtered through his kidneys I am concerned about dropping his blood pressure and he needs close follow-up to monitor this and the dosage of this medication.  I also made the PA at the kidney center aware of change in medicine.  I am also worried about changing his medication if he moved to [place] and again discussed that this was not a good idea until we got him medically stable.  He is followed at the dialysis center weekly and so they can also monitor his blood pressure and adjust medication as needed.  If this does not fix his cough he may need more of a work-up including CT of chest due to all the immunosuppressive therapy he has been on with his transplant history.   More than 30 minutes of this appointment was spent in counseling, coordinating care.    Return in about 1 month (around 09/19/2017).     Orma Flaming, MD [place]  08/19/2017

## 2017-08-20 DIAGNOSIS — E876 Hypokalemia: Secondary | ICD-10-CM | POA: Diagnosis not present

## 2017-08-20 DIAGNOSIS — N186 End stage renal disease: Secondary | ICD-10-CM | POA: Diagnosis not present

## 2017-08-20 DIAGNOSIS — N2581 Secondary hyperparathyroidism of renal origin: Secondary | ICD-10-CM | POA: Diagnosis not present

## 2017-08-20 DIAGNOSIS — E8770 Fluid overload, unspecified: Secondary | ICD-10-CM | POA: Diagnosis not present

## 2017-08-20 DIAGNOSIS — D649 Anemia, unspecified: Secondary | ICD-10-CM | POA: Diagnosis not present

## 2017-08-20 LAB — URINE CULTURE
MICRO NUMBER: 90646614
Result:: NO GROWTH
SPECIMEN QUALITY: ADEQUATE

## 2017-08-22 ENCOUNTER — Emergency Department (HOSPITAL_COMMUNITY): Payer: Medicare Other

## 2017-08-22 ENCOUNTER — Other Ambulatory Visit: Payer: Self-pay

## 2017-08-22 ENCOUNTER — Emergency Department (HOSPITAL_COMMUNITY)
Admission: EM | Admit: 2017-08-22 | Discharge: 2017-08-22 | Disposition: A | Discharge status: Home / Self Care | Payer: Medicare Other | Attending: Emergency Medicine | Admitting: Emergency Medicine

## 2017-08-22 ENCOUNTER — Encounter (HOSPITAL_COMMUNITY): Payer: Self-pay | Admitting: Emergency Medicine

## 2017-08-22 DIAGNOSIS — I120 Hypertensive chronic kidney disease with stage 5 chronic kidney disease or end stage renal disease: Secondary | ICD-10-CM | POA: Diagnosis not present

## 2017-08-22 DIAGNOSIS — Z79899 Other long term (current) drug therapy: Secondary | ICD-10-CM | POA: Diagnosis not present

## 2017-08-22 DIAGNOSIS — R059 Cough, unspecified: Secondary | ICD-10-CM

## 2017-08-22 DIAGNOSIS — N186 End stage renal disease: Secondary | ICD-10-CM | POA: Diagnosis not present

## 2017-08-22 DIAGNOSIS — R05 Cough: Secondary | ICD-10-CM | POA: Insufficient documentation

## 2017-08-22 DIAGNOSIS — R079 Chest pain, unspecified: Secondary | ICD-10-CM | POA: Diagnosis not present

## 2017-08-22 DIAGNOSIS — T8612 Kidney transplant failure: Secondary | ICD-10-CM | POA: Diagnosis not present

## 2017-08-22 DIAGNOSIS — D508 Other iron deficiency anemias: Secondary | ICD-10-CM | POA: Diagnosis not present

## 2017-08-22 DIAGNOSIS — D649 Anemia, unspecified: Secondary | ICD-10-CM | POA: Diagnosis not present

## 2017-08-22 DIAGNOSIS — N2581 Secondary hyperparathyroidism of renal origin: Secondary | ICD-10-CM | POA: Diagnosis not present

## 2017-08-22 DIAGNOSIS — R9431 Abnormal electrocardiogram [ECG] [EKG]: Secondary | ICD-10-CM | POA: Diagnosis not present

## 2017-08-22 DIAGNOSIS — Z992 Dependence on renal dialysis: Secondary | ICD-10-CM | POA: Diagnosis not present

## 2017-08-22 DIAGNOSIS — E876 Hypokalemia: Secondary | ICD-10-CM | POA: Diagnosis not present

## 2017-08-22 HISTORY — PX: IR THORACENTESIS ASP PLEURAL SPACE W/IMG GUIDE: IMG5380

## 2017-08-22 LAB — BASIC METABOLIC PANEL
Anion gap: 13 (ref 5–15)
BUN: 31 mg/dL — AB (ref 6–20)
CALCIUM: 8.5 mg/dL — AB (ref 8.9–10.3)
CO2: 29 mmol/L (ref 22–32)
CREATININE: 5.76 mg/dL — AB (ref 0.61–1.24)
Chloride: 99 mmol/L — ABNORMAL LOW (ref 101–111)
GFR calc Af Amer: 11 mL/min — ABNORMAL LOW (ref >60)
GFR calc non Af Amer: 10 mL/min — ABNORMAL LOW (ref >60)
GLUCOSE: 85 mg/dL (ref 65–99)
Potassium: 4.4 mmol/L (ref 3.5–5.1)
Sodium: 141 mmol/L (ref 135–145)

## 2017-08-22 LAB — I-STAT TROPONIN, ED: TROPONIN I, POC: 0.04 ng/mL (ref 0.00–0.08)

## 2017-08-22 LAB — CBC
HCT: 31.4 % — ABNORMAL LOW (ref 39.0–52.0)
Hemoglobin: 9.4 g/dL — ABNORMAL LOW (ref 13.0–17.0)
MCH: 23 pg — AB (ref 26.0–34.0)
MCHC: 29.9 g/dL — AB (ref 30.0–36.0)
MCV: 76.8 fL — ABNORMAL LOW (ref 78.0–100.0)
PLATELETS: 375 10*3/uL (ref 150–400)
RBC: 4.09 MIL/uL — ABNORMAL LOW (ref 4.22–5.81)
RDW: 19.6 % — ABNORMAL HIGH (ref 11.5–15.5)
WBC: 7.4 10*3/uL (ref 4.0–10.5)

## 2017-08-22 MED ORDER — PREDNISONE 50 MG PO TABS: ORAL_TABLET | ORAL | 0 refills | Status: DC

## 2017-08-22 MED ORDER — CLONIDINE HCL 0.1 MG PO TABS
0.1000 mg | ORAL_TABLET | Freq: Once | ORAL | Status: AC
  Administered 2017-08-22: 0.1 mg via ORAL
  Filled 2017-08-22: qty 1

## 2017-08-22 MED ORDER — BENZONATATE 100 MG PO CAPS: 100.0000 mg | ORAL_CAPSULE | Freq: Three times a day (TID) | ORAL | 0 refills | Status: DC

## 2017-08-22 NOTE — ED Provider Notes (Signed)
[facility] DEPT Provider Note   CSN: [id number] Arrival date & time: 08/22/17  2016     History   Chief Complaint Chief Complaint  Patient presents with  . Chest Pain    HPI Ryan [name] is a 57 y.o. male.  57 year old male with history of end-stage renal disease secondary to hypertension status post renal transplant with rejection presents with several week history of nonproductive cough.  States he has had postnasal drip that seems to exacerbate this.  Denies any exertional chest pain.  No orthopnea or dyspnea on exertion.  Pain is sharp and nonpleuritic.  Not better with sitting forward.  Has been seen by his doctor for this multiple times and was placed on antibiotics which she states that the symptoms better transiently.  Is unsure if he has been on prednisone for this.  Last dialysis was yesterday and he went for the full session.     Past Medical History:  Diagnosis Date  . Allergy   . Blood transfusion without reported diagnosis   . Dialysis patient ([place])    Tues,thurs,sat  . ESRD (end stage renal disease) ([place])   . Hyperlipidemia   . Hypertension     Patient Active Problem List   Diagnosis Date Noted  . Lesion of vertebra 07/13/2017  . Renal transplant, status post 08/26/2016  . Essential hypertension 08/26/2016  . ESRD (end stage renal disease) ([place]) 08/26/2016  . Anemia 04/09/2016  . CKD (chronic kidney disease) 03/31/2016  . EKG abnormalities   . 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 A/V FISTULAGRAM Left 12/15/2016   Procedure: A/V Fistulagram - left;  Surgeon: Angelia Mould, MD;  Location: [facility];  Service: Cardiovascular;  Laterality: Left;  . AV FISTULA PLACEMENT Left 08/28/2016   Procedure: LEFT UPPER  ARM ARTERIOVENOUS (AV) FISTULA CREATION;  Surgeon: Angelia Mould, MD;  Location: [facility];  Service: Vascular;  Laterality:  Left;  . KIDNEY TRANSPLANT    . stent in kidneys     dec 2017        Home Medications    Prior to Admission medications   Medication Sig Start Date End Date Taking? Authorizing Provider  cloNIDine (CATAPRES) 0.2 MG tablet Take 0.1 mg by mouth 2 (two) times daily. 12/01/16   [provider]  furosemide (LASIX) 80 MG tablet TAKE 1 TABLET (80 MG TOTAL) BY MOUTH 2 TIMES DAILY. 08/28/16   [provider]  labetalol (NORMODYNE) 200 MG tablet Take 200 mg by mouth 2 (two) times daily.     [provider]  loratadine (CLARITIN) 10 MG tablet Take 10 mg by mouth daily as needed for allergies.    [provider]  losartan (COZAAR) 25 MG tablet Take 1 tablet (25 mg total) by mouth daily. 08/19/17   Orma Flaming, MD  multivitamin (RENA-VIT) TABS tablet Take 1 tablet by mouth daily.    [provider]  mycophenolate (MYFORTIC) 360 MG TBEC EC tablet Take 360 mg by mouth 2 (two) times daily.    [provider]  NIFEdipine (PROCARDIA XL/ADALAT-CC) 60 MG 24 hr tablet Take 60 mg by mouth 2 (two) times daily.    [provider]  Nutritional Supplements (FEEDING SUPPLEMENT, NEPRO CARB STEADY,) LIQD Take 237 mLs by mouth 3 (three) times daily between meals. 08/28/16   Lavina Hamman, MD  pravastatin (PRAVACHOL) 80 MG tablet Take 1 tablet (80 mg  total) by mouth daily. 06/10/17   Inda Coke, PA  predniSONE (DELTASONE) 5 MG tablet Take 5 mg by mouth daily with breakfast.    [provider]  RENAGEL 800 MG tablet Take 800-2,400 mg by mouth See admin instructions. Take 3 tablets with meals & 1 tablet with snacks. 12/01/16   [provider]  SENSIPAR 30 MG tablet Take 30 mg by mouth daily. 05/22/17   [provider]  tacrolimus (PROGRAF) 5 MG capsule Take 5 mg by mouth 2 (two) times daily.    [provider]    Family History Family History  Problem Relation Age of Onset  . Hypertension Mother   . Heart disease  Mother   . Stroke Mother   . Cancer Father   . Kidney cancer Father   . Hypertension Sister   . Heart disease Maternal Grandmother   . Alcohol abuse Maternal Grandfather   . Mental illness Paternal Grandmother   . Learning disabilities Paternal Grandmother        Alzheimer's   . Stroke Paternal Grandfather   . Colon cancer Neg Hx   . Colon polyps Neg Hx   . Esophageal cancer Neg Hx   . Rectal cancer Neg Hx   . Stomach cancer Neg Hx     Social History Social History   Tobacco Use  . Smoking status: Never Smoker  . Smokeless tobacco: Never Used  Substance Use Topics  . Alcohol use: No  . Drug use: No     Allergies   No known allergies   Review of Systems Review of Systems  All other systems reviewed and are negative.    Physical Exam Updated Vital Signs BP (!) 203/113 (BP Location: Left Arm)   Pulse 100   Temp 98.2 F (36.8 C) (Oral)   Resp (!) 22   Ht 1.753 m (5\' 9" )   Wt 79.4 kg (175 lb) Comment: after dialysis today.  SpO2 94%   BMI 25.84 kg/m   Physical Exam  Constitutional: He is oriented to person, place, and time. He appears well-developed and well-nourished.  Non-toxic appearance. No distress.  HENT:  Head: Normocephalic and atraumatic.  Eyes: Pupils are equal, round, and reactive to light. Conjunctivae, EOM and lids are normal.  Neck: Normal range of motion. Neck supple. No tracheal deviation present. No thyroid mass present.  Cardiovascular: Normal rate, regular rhythm and normal heart sounds. Exam reveals no gallop.  No murmur heard. Pulmonary/Chest: Effort normal and breath sounds normal. No stridor. No respiratory distress. He has no decreased breath sounds. He has no wheezes. He has no rhonchi. He has no rales.  Abdominal: Soft. Normal appearance and bowel sounds are normal. He exhibits no distension. There is no tenderness. There is no rebound and no CVA tenderness.  Musculoskeletal: Normal range of motion. He exhibits no edema or  tenderness.  Neurological: He is alert and oriented to person, place, and time. He has normal strength. No cranial nerve deficit or sensory deficit. GCS eye subscore is 4. GCS verbal subscore is 5. GCS motor subscore is 6.  Skin: Skin is warm and dry. No abrasion and no rash noted.  Psychiatric: He has a normal mood and affect. His speech is normal and behavior is normal.  Nursing note and vitals reviewed.    ED Treatments / Results  Labs (all labs ordered are listed, but only abnormal results are displayed) Labs Reviewed  BASIC METABOLIC PANEL  CBC  I-STAT TROPONIN, ED  EKG None  Radiology No results found.  Procedures Procedures (including critical care time)  Medications Ordered in ED Medications  cloNIDine (CATAPRES) tablet 0.1 mg (has no administration in time range)     Initial Impression / Assessment and Plan / ED Course  I have reviewed the triage vital signs and the nursing notes.  Pertinent labs & imaging results that were available during my care of the patient were reviewed by me and considered in my medical decision making (see chart for details).     Patient complains of chronic cough x1 month.  Blood pressure elevation noted here and treated with Catapres.  Chest x-ray without acute infiltrate or overt CHF.  Will try patient on course of prednisone as well as give Ladona Ridgel and return precautions given  Final Clinical Impressions(s) / ED Diagnoses   Final diagnoses:  None    ED Discharge Orders    None       Lacretia Leigh, MD 08/22/17 2233

## 2017-08-22 NOTE — ED Triage Notes (Signed)
Patient is complaining of multiple complaints. Patient states he is having mid chest pain. Patient is starting have a lot of fatigue, swelling in legs, high blood pressure, and nose is running.

## 2017-08-25 DIAGNOSIS — N186 End stage renal disease: Secondary | ICD-10-CM | POA: Diagnosis not present

## 2017-08-25 DIAGNOSIS — N2581 Secondary hyperparathyroidism of renal origin: Secondary | ICD-10-CM | POA: Diagnosis not present

## 2017-08-25 DIAGNOSIS — D508 Other iron deficiency anemias: Secondary | ICD-10-CM | POA: Diagnosis not present

## 2017-08-25 DIAGNOSIS — E876 Hypokalemia: Secondary | ICD-10-CM | POA: Diagnosis not present

## 2017-08-25 DIAGNOSIS — D649 Anemia, unspecified: Secondary | ICD-10-CM | POA: Diagnosis not present

## 2017-08-27 DIAGNOSIS — N186 End stage renal disease: Secondary | ICD-10-CM | POA: Diagnosis not present

## 2017-08-27 DIAGNOSIS — I120 Hypertensive chronic kidney disease with stage 5 chronic kidney disease or end stage renal disease: Secondary | ICD-10-CM | POA: Diagnosis not present

## 2017-08-27 DIAGNOSIS — R918 Other nonspecific abnormal finding of lung field: Secondary | ICD-10-CM | POA: Diagnosis not present

## 2017-08-27 DIAGNOSIS — D508 Other iron deficiency anemias: Secondary | ICD-10-CM | POA: Diagnosis not present

## 2017-08-27 DIAGNOSIS — N4889 Other specified disorders of penis: Secondary | ICD-10-CM | POA: Diagnosis not present

## 2017-08-27 DIAGNOSIS — E876 Hypokalemia: Secondary | ICD-10-CM | POA: Diagnosis not present

## 2017-08-27 DIAGNOSIS — R319 Hematuria, unspecified: Secondary | ICD-10-CM | POA: Diagnosis not present

## 2017-08-27 DIAGNOSIS — N2581 Secondary hyperparathyroidism of renal origin: Secondary | ICD-10-CM | POA: Diagnosis not present

## 2017-08-27 DIAGNOSIS — J209 Acute bronchitis, unspecified: Secondary | ICD-10-CM | POA: Diagnosis not present

## 2017-08-27 DIAGNOSIS — R0600 Dyspnea, unspecified: Secondary | ICD-10-CM | POA: Diagnosis not present

## 2017-08-27 DIAGNOSIS — R05 Cough: Secondary | ICD-10-CM | POA: Diagnosis not present

## 2017-08-27 DIAGNOSIS — Z992 Dependence on renal dialysis: Secondary | ICD-10-CM | POA: Diagnosis not present

## 2017-08-27 DIAGNOSIS — I517 Cardiomegaly: Secondary | ICD-10-CM | POA: Diagnosis not present

## 2017-08-27 DIAGNOSIS — I132 Hypertensive heart and chronic kidney disease with heart failure and with stage 5 chronic kidney disease, or end stage renal disease: Secondary | ICD-10-CM | POA: Diagnosis not present

## 2017-08-27 DIAGNOSIS — Z9981 Dependence on supplemental oxygen: Secondary | ICD-10-CM | POA: Diagnosis not present

## 2017-08-27 DIAGNOSIS — R369 Urethral discharge, unspecified: Secondary | ICD-10-CM | POA: Insufficient documentation

## 2017-08-27 DIAGNOSIS — Z940 Kidney transplant status: Secondary | ICD-10-CM | POA: Diagnosis not present

## 2017-08-27 DIAGNOSIS — R0602 Shortness of breath: Secondary | ICD-10-CM | POA: Diagnosis not present

## 2017-08-27 DIAGNOSIS — J3489 Other specified disorders of nose and nasal sinuses: Secondary | ICD-10-CM | POA: Diagnosis not present

## 2017-08-27 DIAGNOSIS — R0989 Other specified symptoms and signs involving the circulatory and respiratory systems: Secondary | ICD-10-CM | POA: Diagnosis not present

## 2017-08-27 DIAGNOSIS — D631 Anemia in chronic kidney disease: Secondary | ICD-10-CM | POA: Diagnosis not present

## 2017-08-27 DIAGNOSIS — D649 Anemia, unspecified: Secondary | ICD-10-CM | POA: Diagnosis not present

## 2017-08-27 DIAGNOSIS — R109 Unspecified abdominal pain: Secondary | ICD-10-CM | POA: Diagnosis not present

## 2017-08-27 DIAGNOSIS — J90 Pleural effusion, not elsewhere classified: Secondary | ICD-10-CM | POA: Diagnosis not present

## 2017-08-28 DIAGNOSIS — Z992 Dependence on renal dialysis: Secondary | ICD-10-CM | POA: Diagnosis not present

## 2017-08-28 DIAGNOSIS — R319 Hematuria, unspecified: Secondary | ICD-10-CM | POA: Diagnosis not present

## 2017-08-28 DIAGNOSIS — Z940 Kidney transplant status: Secondary | ICD-10-CM | POA: Diagnosis not present

## 2017-08-28 DIAGNOSIS — T8611 Kidney transplant rejection: Secondary | ICD-10-CM | POA: Diagnosis not present

## 2017-08-28 DIAGNOSIS — R310 Gross hematuria: Secondary | ICD-10-CM | POA: Diagnosis not present

## 2017-08-28 DIAGNOSIS — Z79899 Other long term (current) drug therapy: Secondary | ICD-10-CM | POA: Diagnosis not present

## 2017-08-28 DIAGNOSIS — Z5181 Encounter for therapeutic drug level monitoring: Secondary | ICD-10-CM | POA: Diagnosis not present

## 2017-08-28 DIAGNOSIS — N2581 Secondary hyperparathyroidism of renal origin: Secondary | ICD-10-CM | POA: Diagnosis not present

## 2017-08-28 DIAGNOSIS — I120 Hypertensive chronic kidney disease with stage 5 chronic kidney disease or end stage renal disease: Secondary | ICD-10-CM | POA: Diagnosis not present

## 2017-08-28 DIAGNOSIS — T8612 Kidney transplant failure: Secondary | ICD-10-CM | POA: Diagnosis not present

## 2017-08-28 DIAGNOSIS — R600 Localized edema: Secondary | ICD-10-CM | POA: Diagnosis not present

## 2017-08-28 DIAGNOSIS — R0600 Dyspnea, unspecified: Secondary | ICD-10-CM | POA: Diagnosis not present

## 2017-08-28 DIAGNOSIS — Z9115 Patient's noncompliance with renal dialysis: Secondary | ICD-10-CM | POA: Diagnosis not present

## 2017-08-28 DIAGNOSIS — D631 Anemia in chronic kidney disease: Secondary | ICD-10-CM | POA: Diagnosis not present

## 2017-08-28 DIAGNOSIS — Z960 Presence of urogenital implants: Secondary | ICD-10-CM | POA: Diagnosis not present

## 2017-08-28 DIAGNOSIS — J90 Pleural effusion, not elsewhere classified: Secondary | ICD-10-CM | POA: Insufficient documentation

## 2017-08-28 DIAGNOSIS — I808 Phlebitis and thrombophlebitis of other sites: Secondary | ICD-10-CM | POA: Diagnosis not present

## 2017-08-28 DIAGNOSIS — N186 End stage renal disease: Secondary | ICD-10-CM | POA: Diagnosis not present

## 2017-08-28 DIAGNOSIS — E875 Hyperkalemia: Secondary | ICD-10-CM | POA: Diagnosis not present

## 2017-08-28 DIAGNOSIS — R9431 Abnormal electrocardiogram [ECG] [EKG]: Secondary | ICD-10-CM | POA: Diagnosis not present

## 2017-08-29 DIAGNOSIS — Z9889 Other specified postprocedural states: Secondary | ICD-10-CM | POA: Diagnosis not present

## 2017-08-29 DIAGNOSIS — T8611 Kidney transplant rejection: Secondary | ICD-10-CM | POA: Diagnosis not present

## 2017-08-29 DIAGNOSIS — Z801 Family history of malignant neoplasm of trachea, bronchus and lung: Secondary | ICD-10-CM | POA: Diagnosis not present

## 2017-08-29 DIAGNOSIS — N186 End stage renal disease: Secondary | ICD-10-CM | POA: Diagnosis present

## 2017-08-29 DIAGNOSIS — J209 Acute bronchitis, unspecified: Secondary | ICD-10-CM | POA: Diagnosis present

## 2017-08-29 DIAGNOSIS — D631 Anemia in chronic kidney disease: Secondary | ICD-10-CM | POA: Diagnosis not present

## 2017-08-29 DIAGNOSIS — N2581 Secondary hyperparathyroidism of renal origin: Secondary | ICD-10-CM | POA: Diagnosis not present

## 2017-08-29 DIAGNOSIS — R918 Other nonspecific abnormal finding of lung field: Secondary | ICD-10-CM | POA: Diagnosis not present

## 2017-08-29 DIAGNOSIS — E785 Hyperlipidemia, unspecified: Secondary | ICD-10-CM | POA: Diagnosis present

## 2017-08-29 DIAGNOSIS — R609 Edema, unspecified: Secondary | ICD-10-CM | POA: Diagnosis not present

## 2017-08-29 DIAGNOSIS — Z8249 Family history of ischemic heart disease and other diseases of the circulatory system: Secondary | ICD-10-CM | POA: Diagnosis not present

## 2017-08-29 DIAGNOSIS — Z992 Dependence on renal dialysis: Secondary | ICD-10-CM | POA: Diagnosis not present

## 2017-08-29 DIAGNOSIS — Z9115 Patient's noncompliance with renal dialysis: Secondary | ICD-10-CM | POA: Diagnosis not present

## 2017-08-29 DIAGNOSIS — T8612 Kidney transplant failure: Secondary | ICD-10-CM | POA: Diagnosis not present

## 2017-08-29 DIAGNOSIS — I132 Hypertensive heart and chronic kidney disease with heart failure and with stage 5 chronic kidney disease, or end stage renal disease: Secondary | ICD-10-CM | POA: Diagnosis present

## 2017-08-29 DIAGNOSIS — Z940 Kidney transplant status: Secondary | ICD-10-CM | POA: Diagnosis not present

## 2017-08-29 DIAGNOSIS — Z9119 Patient's noncompliance with other medical treatment and regimen: Secondary | ICD-10-CM | POA: Diagnosis not present

## 2017-08-29 DIAGNOSIS — Z960 Presence of urogenital implants: Secondary | ICD-10-CM | POA: Diagnosis present

## 2017-08-29 DIAGNOSIS — R319 Hematuria, unspecified: Secondary | ICD-10-CM | POA: Diagnosis present

## 2017-08-29 DIAGNOSIS — J90 Pleural effusion, not elsewhere classified: Secondary | ICD-10-CM | POA: Diagnosis present

## 2017-08-29 DIAGNOSIS — Z79899 Other long term (current) drug therapy: Secondary | ICD-10-CM | POA: Diagnosis not present

## 2017-08-29 DIAGNOSIS — I120 Hypertensive chronic kidney disease with stage 5 chronic kidney disease or end stage renal disease: Secondary | ICD-10-CM | POA: Diagnosis not present

## 2017-08-29 DIAGNOSIS — R0600 Dyspnea, unspecified: Secondary | ICD-10-CM | POA: Diagnosis not present

## 2017-08-29 DIAGNOSIS — R600 Localized edema: Secondary | ICD-10-CM | POA: Diagnosis not present

## 2017-08-29 DIAGNOSIS — R310 Gross hematuria: Secondary | ICD-10-CM | POA: Diagnosis not present

## 2017-08-29 DIAGNOSIS — Z4822 Encounter for aftercare following kidney transplant: Secondary | ICD-10-CM | POA: Diagnosis not present

## 2017-08-29 DIAGNOSIS — Z7982 Long term (current) use of aspirin: Secondary | ICD-10-CM | POA: Diagnosis not present

## 2017-09-01 DIAGNOSIS — E876 Hypokalemia: Secondary | ICD-10-CM | POA: Diagnosis not present

## 2017-09-01 DIAGNOSIS — N2581 Secondary hyperparathyroidism of renal origin: Secondary | ICD-10-CM | POA: Diagnosis not present

## 2017-09-01 DIAGNOSIS — N186 End stage renal disease: Secondary | ICD-10-CM | POA: Diagnosis not present

## 2017-09-01 DIAGNOSIS — D508 Other iron deficiency anemias: Secondary | ICD-10-CM | POA: Diagnosis not present

## 2017-09-01 DIAGNOSIS — D649 Anemia, unspecified: Secondary | ICD-10-CM | POA: Diagnosis not present

## 2017-09-03 DIAGNOSIS — N186 End stage renal disease: Secondary | ICD-10-CM | POA: Diagnosis not present

## 2017-09-03 DIAGNOSIS — N2581 Secondary hyperparathyroidism of renal origin: Secondary | ICD-10-CM | POA: Diagnosis not present

## 2017-09-03 DIAGNOSIS — E876 Hypokalemia: Secondary | ICD-10-CM | POA: Diagnosis not present

## 2017-09-03 DIAGNOSIS — D649 Anemia, unspecified: Secondary | ICD-10-CM | POA: Diagnosis not present

## 2017-09-03 DIAGNOSIS — D508 Other iron deficiency anemias: Secondary | ICD-10-CM | POA: Diagnosis not present

## 2017-09-04 ENCOUNTER — Other Ambulatory Visit: Payer: Self-pay | Admitting: Physician Assistant

## 2017-09-05 DIAGNOSIS — D508 Other iron deficiency anemias: Secondary | ICD-10-CM | POA: Diagnosis not present

## 2017-09-05 DIAGNOSIS — E876 Hypokalemia: Secondary | ICD-10-CM | POA: Diagnosis not present

## 2017-09-05 DIAGNOSIS — N2581 Secondary hyperparathyroidism of renal origin: Secondary | ICD-10-CM | POA: Diagnosis not present

## 2017-09-05 DIAGNOSIS — N186 End stage renal disease: Secondary | ICD-10-CM | POA: Diagnosis not present

## 2017-09-05 DIAGNOSIS — D649 Anemia, unspecified: Secondary | ICD-10-CM | POA: Diagnosis not present

## 2017-09-08 DIAGNOSIS — N186 End stage renal disease: Secondary | ICD-10-CM | POA: Diagnosis not present

## 2017-09-08 DIAGNOSIS — N2581 Secondary hyperparathyroidism of renal origin: Secondary | ICD-10-CM | POA: Diagnosis not present

## 2017-09-08 DIAGNOSIS — D649 Anemia, unspecified: Secondary | ICD-10-CM | POA: Diagnosis not present

## 2017-09-08 DIAGNOSIS — D508 Other iron deficiency anemias: Secondary | ICD-10-CM | POA: Diagnosis not present

## 2017-09-08 DIAGNOSIS — E876 Hypokalemia: Secondary | ICD-10-CM | POA: Diagnosis not present

## 2017-09-09 ENCOUNTER — Ambulatory Visit: Payer: Medicare Other | Admitting: Physician Assistant

## 2017-09-10 ENCOUNTER — Other Ambulatory Visit: Payer: Self-pay | Admitting: Family Medicine

## 2017-09-10 DIAGNOSIS — E876 Hypokalemia: Secondary | ICD-10-CM | POA: Diagnosis not present

## 2017-09-10 DIAGNOSIS — D649 Anemia, unspecified: Secondary | ICD-10-CM | POA: Diagnosis not present

## 2017-09-10 DIAGNOSIS — I10 Essential (primary) hypertension: Secondary | ICD-10-CM

## 2017-09-10 DIAGNOSIS — N186 End stage renal disease: Secondary | ICD-10-CM | POA: Diagnosis not present

## 2017-09-10 DIAGNOSIS — D508 Other iron deficiency anemias: Secondary | ICD-10-CM | POA: Diagnosis not present

## 2017-09-10 DIAGNOSIS — N2581 Secondary hyperparathyroidism of renal origin: Secondary | ICD-10-CM | POA: Diagnosis not present

## 2017-09-11 ENCOUNTER — Telehealth: Payer: Self-pay | Admitting: Physician Assistant

## 2017-09-11 ENCOUNTER — Other Ambulatory Visit: Payer: Self-pay | Admitting: Family Medicine

## 2017-09-11 DIAGNOSIS — R059 Cough, unspecified: Secondary | ICD-10-CM

## 2017-09-11 DIAGNOSIS — R05 Cough: Secondary | ICD-10-CM

## 2017-09-11 NOTE — Telephone Encounter (Signed)
Please let him know he has had maximum treatment done including steroids, inhaler, antibiotic, stopping his lisinopril. At this point, we need to send to pulmonology for further investigation. I put in referral for him.

## 2017-09-11 NOTE — Telephone Encounter (Signed)
Will send message to Dr. Orma Flaming who is taking over patient's care.  Inda Coke PA-C 09/11/17

## 2017-09-11 NOTE — Telephone Encounter (Signed)
See note

## 2017-09-11 NOTE — Telephone Encounter (Signed)
Please see message and advise 

## 2017-09-11 NOTE — Telephone Encounter (Signed)
Attempted to contact pt on mobile # (only contact # listed for pt) no answer and voice mailbox was full-unable to leave msg

## 2017-09-11 NOTE — Telephone Encounter (Signed)
Copied from [place] [id number]. Topic: Quick Communication - See Telephone Encounter >> Sep 11, 2017  9:48 AM Rutherford Nail, NT wrote: CRM for notification. See Telephone encounter for: 09/11/17. Patient calling and states that he still has a very persistent cough. Would like to know if medication could be sent to the pharmacy. States that he has tried over the counter cough medication and it doesn't help. Please advise. CB#: [phone] [id number] - [place], [place]

## 2017-09-12 DIAGNOSIS — N186 End stage renal disease: Secondary | ICD-10-CM | POA: Diagnosis not present

## 2017-09-12 DIAGNOSIS — N2581 Secondary hyperparathyroidism of renal origin: Secondary | ICD-10-CM | POA: Diagnosis not present

## 2017-09-12 DIAGNOSIS — E876 Hypokalemia: Secondary | ICD-10-CM | POA: Diagnosis not present

## 2017-09-12 DIAGNOSIS — D649 Anemia, unspecified: Secondary | ICD-10-CM | POA: Diagnosis not present

## 2017-09-12 DIAGNOSIS — D508 Other iron deficiency anemias: Secondary | ICD-10-CM | POA: Diagnosis not present

## 2017-09-14 ENCOUNTER — Encounter: Payer: Self-pay | Admitting: Cardiology

## 2017-09-14 ENCOUNTER — Ambulatory Visit (INDEPENDENT_AMBULATORY_CARE_PROVIDER_SITE_OTHER): Payer: Medicare Other | Admitting: Cardiology

## 2017-09-14 VITALS — BP 152/86 | HR 82 | Ht 69.0 in | Wt 181.4 lb

## 2017-09-14 DIAGNOSIS — N186 End stage renal disease: Secondary | ICD-10-CM | POA: Diagnosis not present

## 2017-09-14 DIAGNOSIS — Z940 Kidney transplant status: Secondary | ICD-10-CM

## 2017-09-14 DIAGNOSIS — R079 Chest pain, unspecified: Secondary | ICD-10-CM | POA: Diagnosis not present

## 2017-09-14 DIAGNOSIS — R0600 Dyspnea, unspecified: Secondary | ICD-10-CM

## 2017-09-14 DIAGNOSIS — I10 Essential (primary) hypertension: Secondary | ICD-10-CM | POA: Diagnosis not present

## 2017-09-14 DIAGNOSIS — R0609 Other forms of dyspnea: Secondary | ICD-10-CM | POA: Diagnosis not present

## 2017-09-14 NOTE — Assessment & Plan Note (Signed)
Profound exertional dyspnea associated with some chest tightness.  Need to exclude reduced EF versus diastolic dysfunction alone.  Clearly is volume overloaded and question if some of this can be CHF related.  Also with the chest tightness [place] need to exclude ischemia.  Will check 2D echocardiogram and Myoview stress test.

## 2017-09-14 NOTE — Progress Notes (Signed)
PCP: Inda Coke, PA  Nephrologist: Dr. Lorrene Reid  Clinic Note: Chief Complaint  Patient presents with  . New Patient (Initial Visit)    Cough, shortness of breath, chest discomfort    HPI: Ryan [name] is a 57 y.o. male with history of end-stage hypertensive renal disease who is being seen today for the evaluation of "pulmonary vascular congestion" to be dialyzed at the request of Inda Coke, [place]. --He has had x-rays in February and March of this year showing some vascular congestion and apparently has noted some frequent coughing with light pink sputum.  He has had a history of renal failure with transplant in 2010.  He is now essentially an uric, and back on hemodialysis for about the last year.  He had a left brachycephalic fistula placed by Dr. Scot Dock back in September 2018. -->  Apparently they have been talking about challenging his dry weight of late.  Ryan [name] was seen by his PCP back on May 7 noticing recurrent coughing.  She indicates that he has had chest x-rays both in February and March showing cardiomegaly with vascular congestion but no true pulmonary edema.  At that time he noted that he was occasionally coughing up light pink sputum.  He also indicated that they were having a hard time challenging his dry weight doing dialysis.  He indicates that he has not actually seen Dr. Lorrene Reid in clinic visit for some time.  Recent Hospitalizations:   ER visit in May 19, 2017: Intermittent cough for 2 weeks.  Worse lying down.  Thought to be associated with postnasal drip.  Productive of clear mucus.  No fevers chills cold sweats.  Also noted edema and dyspnea.  Rales with bibasilar crackles noted on exam.  Thought to be volume overloaded.  -->  Seen and followed by PCP --> also taking 80 twice daily Lasix with no urine output.  Reported dry weight at that time was 7 8.6 kg and he was currently weighing 82.6 kg; chest x-ray in ER noted to show right pleural  effusion  ER visit 07/11/2017 for right lower quadrant pain thought to be related to a small incisional hernia.  ER visit August 22, 2017: Again noted nonproductive cough.  No orthopnea or PND  ER visit at [facility] August 27, 2017: Found to have pericardial effusion, sent for right thoracentesis; treated with moxifloxacin --Noted to be hypertensive with blood pressures in the 150-160/70-90s.=-> Dyspnea notably improved.  Studies Personally Reviewed - (if available, images/films reviewed: From Epic Chart or Care Everywhere)  Right-sided thoracentesis 08/31/2017 (1.2 L)  Interval History: Ryan [name] presents here today for evaluation for possible heart failure.  He has chronic lower extremity edema which seems to have been ongoing for years, especially over the last year since his been on dialysis.  He has been noticing that annoying cough which intermittently has been associated with some thick pinkish sputum, but another evaluations has been relatively scant and dry.  There has been some reported history of PND and orthopnea and others not.  He really does not describe as much symptoms consistent with persistent heart failure, but he does note that although he starts off with 2 small pillows he occasionally will wake up to catch his breath and sometimes her to sit up.  This has been happening more frequently over the last couple months. He does note that his edema is chronic and it usually does improve after dialysis.  What he really notes is in addition to the  edema and cough is that he does have exertional dyspnea which simply walking back to the back of the clinic room he became dyspneic.  If he continues to push beyond the dyspnea he will start noticing some tightness in his chest.  Both the symptoms are usually relieved with rest.  He does note that his cough and shortness of breath has improved since his thoracentesis.  Cardiac review of symptoms: Cardiovascular ROS: positive for - chest pain,  dyspnea on exertion, edema, paroxysmal nocturnal dyspnea and shortness of breath negative for - irregular heartbeat, loss of consciousness, murmur, orthopnea, palpitations, rapid heart rate or Near syncope, TIA or amaurosis fugax.  He does have some positional dizziness.   ++ cough (persistent); DOE waling to car from house - new over last 2-3 weeks, but probably a few months.   ++ edema (chronic - does go down with HD); 2 pillows with occasional PND (more frequent)  No melena, hematochezia, or epstaxis.  He does have intermittent hematuria/frank blood. No claudication.  ROS: A comprehensive was performed. Review of Systems  Constitutional: Positive for malaise/fatigue.  HENT: Negative for nosebleeds.   Respiratory: Positive for cough and shortness of breath. Negative for sputum production (occasional pink frothy sputum) and wheezing.   Cardiovascular: Positive for chest pain (occasional tightness) and leg swelling.  Gastrointestinal: Negative for blood in stool and melena.  Genitourinary: Negative for hematuria.  Neurological: Positive for dizziness and tingling (fingers). Negative for focal weakness, seizures, loss of consciousness, weakness and headaches.  Psychiatric/Behavioral: Negative for depression and memory loss. The patient is not nervous/anxious and does not have insomnia.   All other systems reviewed and are negative.  I have reviewed and (if needed) personally updated the patient's problem list, medications, allergies, past medical and surgical history, social and family history.   Past Medical History:  Diagnosis Date  . Allergy   . Blood transfusion without reported diagnosis   . Dialysis patient ([facility])    Tues,thurs,sat  . ESRD (end stage renal disease) ([place])   . Hyperlipidemia   . Hypertension     Past Surgical History:  Procedure Laterality Date  . A/V FISTULAGRAM Left 12/15/2016   Procedure: A/V Fistulagram - left;  Surgeon: Angelia Mould, MD;   Location: [facility];  Service: Cardiovascular;  Laterality: Left;  . AV FISTULA PLACEMENT Left 08/28/2016   Procedure: LEFT UPPER  ARM ARTERIOVENOUS (AV) FISTULA CREATION;  Surgeon: Angelia Mould, MD;  Location: [facility];  Service: Vascular;  Laterality: Left;  . IR THORACENTESIS ASP PLEURAL SPACE W/IMG GUIDE  08/22/2017   1.2 L -right-sided  . stent in kidneys     dec 2017    Current Meds  Medication Sig  . benzonatate (TESSALON) 100 MG capsule Take 1 capsule (100 mg total) by mouth every 8 (eight) hours.  . cloNIDine (CATAPRES) 0.2 MG tablet Take 0.1 mg by mouth 2 (two) times daily.  . furosemide (LASIX) 80 MG tablet TAKE 1 TABLET (80 MG TOTAL) BY MOUTH 2 TIMES DAILY.  Marland Kitchen guaiFENesin (MUCINEX) 600 MG 12 hr tablet Take 1,200 mg by mouth 2 (two) times daily as needed for to loosen phlegm.  . labetalol (NORMODYNE) 200 MG tablet Take 200 mg by mouth 2 (two) times daily.   Marland Kitchen loratadine (CLARITIN) 10 MG tablet Take 10 mg by mouth daily as needed for allergies.  Marland Kitchen losartan (COZAAR) 25 MG tablet TAKE 1 TABLET BY MOUTH EVERY DAY  . multivitamin (RENA-VIT) TABS tablet Take 1 tablet  by mouth daily.  . mycophenolate (MYFORTIC) 360 MG TBEC EC tablet Take 360 mg by mouth 2 (two) times daily.  Marland Kitchen NIFEdipine (PROCARDIA XL/ADALAT-CC) 60 MG 24 hr tablet Take 60 mg by mouth 2 (two) times daily.  . Nutritional Supplements (FEEDING SUPPLEMENT, NEPRO CARB STEADY,) LIQD Take 237 mLs by mouth 3 (three) times daily between meals.  . pravastatin (PRAVACHOL) 80 MG tablet TAKE 1 TABLET BY MOUTH EVERY DAY  . predniSONE (DELTASONE) 5 MG tablet Take 5 mg by mouth daily with breakfast.  . predniSONE (DELTASONE) 50 MG tablet 1 p.o. daily x4  . RENAGEL 800 MG tablet Take 800-2,400 mg by mouth See admin instructions. Take 3 tablets with meals & 1 tablet with snacks.  . SENSIPAR 30 MG tablet Take 30 mg by mouth daily.  . tacrolimus (PROGRAF) 5 MG capsule Take 5 mg by mouth 2 (two) times daily.    Allergies    Allergen Reactions  . Azithromycin Nausea And Vomiting    Chest tightness  . No Known Allergies     Social History   Tobacco Use  . Smoking status: Never Smoker  . Smokeless tobacco: Never Used  Substance Use Topics  . Alcohol use: No  . Drug use: No   Social History   Social History Narrative   Divorced   Does Chiropodist work   3 years of college.   Does not drink, does not smoke.  Does not use illicit drugs.   Son in nursing school at [employer]    family history includes Alcohol abuse in his maternal grandfather; Cancer in his father; Heart disease in his maternal grandmother; Heart disease (age of onset: 11) in his mother; Hypertension in his mother and sister; Kidney cancer in his father; Learning disabilities in his paternal grandmother; Mental illness in his paternal grandmother; Stroke in his mother and paternal grandfather. M- CAD in 70s.  Wt Readings from Last 3 Encounters:  09/14/17 181 lb 6.4 oz (82.3 kg)  08/22/17 175 lb (79.4 kg)  08/19/17 178 lb 12.8 oz (81.1 kg)    PHYSICAL EXAM BP (!) 152/86   Pulse 82   Ht 5\' 9"  (1.753 m)   Wt 181 lb 6.4 oz (82.3 kg)   BMI 26.79 kg/m  Physical Exam  Constitutional: He is oriented to person, place, and time. He appears well-developed and well-nourished. No distress.  Well groomed.   HENT:  Head: Normocephalic and atraumatic.  Eyes: Pupils are equal, round, and reactive to light. EOM are normal. No scleral icterus (brown discoloration).  Neck: Normal range of motion. Neck supple. No JVD present.  Cardiovascular: Normal rate, regular rhythm, S1 normal, S2 normal and intact distal pulses.  Occasional extrasystoles are present. PMI is not displaced (cannot palpate). Exam reveals decreased pulses (difficult to palpate pedal pulses 2/2 edema). Exam reveals no gallop and no friction rub.  Murmur heard.  Systolic murmur is present. Diffuse flow murmu heard throughout - cannot exclude referred bruit from fistula  (RUE) Pulmonary/Chest: Effort normal and breath sounds normal. No respiratory distress. He has no wheezes. He has no rales.  Mildly decreased BS R>L lung base with dullness to percussion.  Mild crackles but no rales  Abdominal: Soft. Bowel sounds are normal. He exhibits no distension. There is no tenderness. There is no rebound.  Musculoskeletal: Normal range of motion. He exhibits edema (2+ ankle to knee swelling).  Neurological: He is alert and oriented to person, place, and time. No cranial nerve deficit.  Skin:  Skin is warm and dry.  Psychiatric: He has a normal mood and affect. His behavior is normal. Judgment and thought content normal.     Adult ECG Report  Rate: 82;  Rhythm: normal sinus rhythm and Normal axis, intervals and durations.  Poor R wave progression in precordial leads.;   Narrative Interpretation: Relatively normal EKG   Other studies Reviewed: Additional studies/ records that were reviewed today include:  Recent Labs: June 10, 2017:  Total cholesterol 162, triglycerides 117, HDL 56, LDL 83.  A1c 6.7.  BUN/creatinine 31/5.76.    ASSESSMENT / PLAN: Problem List Items Addressed This Visit    Renal transplant, status post   PND (paroxysmal nocturnal dyspnea)    Waking up short of breath definitely this potentially concern for this being associated with heart failure versus simply volume overload.  Needed 2D echocardiogram to get a full assessment of his EF, diastolic function as well as suggestion of pulmonary pressures.  Unfortunately, his volume level is totally dependent on dialysis.      Relevant Orders   ECHOCARDIOGRAM COMPLETE   MYOCARDIAL PERFUSION IMAGING   Essential hypertension (Chronic)    Poorly controlled blood pressure at present on a somewhat unusual regimen with labetalol, nifedipine and clonidine along with losartan an ESRD-HD patient.  I would like to probably switch him from labetalol to carvedilol (however nephrologist and when to use  metoprolol). -->  Will wait until I see results of echocardiogram and Myoview.  Question benefit of ARB however would likelihood of hypertensive heart disease, will continue for now. We may very well be stuck having to use clonidine although this is somewhat unfavorable as an option.  Would likely prefer using hydralazine/nitrate      ESRD (end stage renal disease) ([place]) (Chronic)    Has not seen his nephrologist as a clinic patient in some time.  Probably should see Dr. Lorrene Reid in order to discuss his dry weight and potential for helping with volume removal.      DOE (dyspnea on exertion) - Primary    Profound exertional dyspnea associated with some chest tightness.  Need to exclude reduced EF versus diastolic dysfunction alone.  Clearly is volume overloaded and question if some of this can be CHF related.  Also with the chest tightness Ryan [name] need to exclude ischemia.  Will check 2D echocardiogram and Myoview stress test.      Relevant Orders   EKG 12-Lead   ECHOCARDIOGRAM COMPLETE   MYOCARDIAL PERFUSION IMAGING   Chest pain with moderate risk for cardiac etiology    Chest tightness associated with exertional dyspnea concerning for possible ischemic/anginal symptoms.  Will evaluate with Myoview stress test.      Relevant Orders   EKG 12-Lead   ECHOCARDIOGRAM COMPLETE   MYOCARDIAL PERFUSION IMAGING       I spent a total of 40 minutes with the patient and chart review. >  50% of the time was spent in direct patient consultation.   Current medicines are reviewed at length with the patient today.  (+/- concerns) he is not sure if he is on good blood pressure medicines are not The following changes have been made:  No current changes until studies are performed.  Patient Instructions  NO MEDICATION CHANGES   WILL SCHEDULE AT [place] Your physician has requested that you have en exercise stress myoview. For further information please visit [email or web address].  Please follow instruction sheet, as given.  AND  WILL SCHEDULE AT [facility]  [place] Your physician has requested that you have an echocardiogram. Echocardiography is a painless test that uses sound waves to create images of your heart. It provides your doctor with information about the size and shape of your heart and how well your heart's chambers and valves are working. This procedure takes approximately one hour. There are no restrictions for this procedure.  Your physician recommends that you schedule a follow-up appointment in 1 MONTH WITH DR Nefertiti Mohamad AFTER TEST ARE COMPLETED.   Studies Ordered:   Orders Placed This Encounter  Procedures  . MYOCARDIAL PERFUSION IMAGING  . EKG 12-Lead  . ECHOCARDIOGRAM COMPLETE      Glenetta Hew, M.D., M.S. Interventional Cardiologist   Pager # [phone] Phone # [phone] [id number] [place], [place] [address]   Thank you for choosing [facility] at [facility]!!

## 2017-09-14 NOTE — Patient Instructions (Signed)
NO MEDICATION CHANGES    WILL SCHEDULE AT [place] Your physician has requested that you have en exercise stress myoview. For further information please visit [email or web address]. Please follow instruction sheet, as given.  AND  WILL SCHEDULE AT [place] [address] Your physician has requested that you have an echocardiogram. Echocardiography is a painless test that uses sound waves to create images of your heart. It provides your doctor with information about the size and shape of your heart and how well your heart's chambers and valves are working. This procedure takes approximately one hour. There are no restrictions for this procedure.  Your physician recommends that you schedule a follow-up appointment in 1 MONTH WITH DR HARDING AFTER TEST ARE COMPLETED.      Echocardiogram An echocardiogram, or echocardiography, uses sound waves (ultrasound) to produce an image of your heart. The echocardiogram is simple, painless, obtained within a short period of time, and offers valuable information to your health care provider. The images from an echocardiogram can provide information such as:  Evidence of coronary artery disease (CAD).  Heart size.  Heart muscle function.  Heart valve function.  Aneurysm detection.  Evidence of a past heart attack.  Fluid buildup around the heart.  Heart muscle thickening.  Assess heart valve function.  Tell a health care provider about:  Any allergies you have.  All medicines you are taking, including vitamins, herbs, eye drops, creams, and over-the-counter medicines.  Any problems you or family members have had with anesthetic medicines.  Any blood disorders you have.  Any surgeries you have had.  Any medical conditions you have.  Whether you are pregnant or may be pregnant. What happens before the procedure? No special preparation is needed. Eat and drink normally. What happens during the  procedure?  In order to produce an image of your heart, gel will be applied to your chest and a wand-like tool (transducer) will be moved over your chest. The gel will help transmit the sound waves from the transducer. The sound waves will harmlessly bounce off your heart to allow the heart images to be captured in real-time motion. These images will then be recorded.  You may need an IV to receive a medicine that improves the quality of the pictures. What happens after the procedure? You may return to your normal schedule including diet, activities, and medicines, unless your health care provider tells you otherwise. This information is not intended to replace advice given to you by your health care provider. Make sure you discuss any questions you have with your health care provider. Document Released: 03/07/2000 Document Revised: 10/27/2015 Document Reviewed: 11/15/2012 Elsevier Interactive Patient Education  2017 [employer].       Cardiac Nuclear Scan A cardiac nuclear scan is a test that measures blood flow to the heart when a person is resting and when he or she is exercising. The test looks for problems such as:  Not enough blood reaching a portion of the heart.  The heart muscle not working normally.  You may need this test if:  You have heart disease.  You have had abnormal lab results.  You have had heart surgery or angioplasty.  You have chest pain.  You have shortness of breath.  In this test, a radioactive dye (tracer) is injected into your bloodstream. After the tracer has traveled to your heart, an imaging device is used to measure how much of the tracer is absorbed by or distributed to  various areas of your heart. This procedure is usually done at a hospital and takes 2-4 hours. Tell a health care provider about:  Any allergies you have.  All medicines you are taking, including vitamins, herbs, eye drops, creams, and over-the-counter medicines.  Any  problems you or family members have had with the use of anesthetic medicines.  Any blood disorders you have.  Any surgeries you have had.  Any medical conditions you have.  Whether you are pregnant or may be pregnant. What are the risks? Generally, this is a safe procedure. However, problems may occur, including:  Serious chest pain and heart attack. This is only a risk if the stress portion of the test is done.  Rapid heartbeat.  Sensation of warmth in your chest. This usually passes quickly.  What happens before the procedure?  Ask your health care provider about changing or stopping your regular medicines. This is especially important if you are taking diabetes medicines or blood thinners.  Remove your jewelry on the day of the procedure. What happens during the procedure?  An IV tube will be inserted into one of your veins.  Your health care provider will inject a small amount of radioactive tracer through the tube.  You will wait for 20-40 minutes while the tracer travels through your bloodstream.  Your heart activity will be monitored with an electrocardiogram (ECG).  You will lie down on an exam table.  Images of your heart will be taken for about 15-20 minutes.  You may be asked to exercise on a treadmill or stationary bike. While you exercise, your heart's activity will be monitored with an ECG, and your blood pressure will be checked. If you are unable to exercise, you may be given a medicine to increase blood flow to parts of your heart.  When blood flow to your heart has peaked, a tracer will again be injected through the IV tube.  After 20-40 minutes, you will get back on the exam table and have more images taken of your heart.  When the procedure is over, your IV tube will be removed. The procedure may vary among health care providers and hospitals. Depending on the type of tracer used, scans may need to be repeated 3-4 hours later. What happens after the  procedure?  Unless your health care provider tells you otherwise, you may return to your normal schedule, including diet, activities, and medicines.  Unless your health care provider tells you otherwise, you may increase your fluid intake. This will help flush the contrast dye from your body. Drink enough fluid to keep your urine clear or pale yellow.  It is up to you to get your test results. Ask your health care provider, or the department that is doing the test, when your results will be ready. Summary  A cardiac nuclear scan measures the blood flow to the heart when a person is resting and when he or she is exercising.  You may need this test if you are at risk for heart disease.  Tell your health care provider if you are pregnant.  Unless your health care provider tells you otherwise, increase your fluid intake. This will help flush the contrast dye from your body. Drink enough fluid to keep your urine clear or pale yellow. This information is not intended to replace advice given to you by your health care provider. Make sure you discuss any questions you have with your health care provider. Document Released: 04/04/2004 Document Revised: 03/12/2016 Document Reviewed:  02/16/2013 Elsevier Interactive Patient Education  2017 [employer].

## 2017-09-14 NOTE — Assessment & Plan Note (Signed)
Has not seen his nephrologist as a clinic patient in some time.  Probably should see Dr. Lorrene Reid in order to discuss his dry weight and potential for helping with volume removal.

## 2017-09-14 NOTE — Assessment & Plan Note (Signed)
Chest tightness associated with exertional dyspnea concerning for possible ischemic/anginal symptoms.  Will evaluate with Myoview stress test.

## 2017-09-14 NOTE — Assessment & Plan Note (Signed)
Waking up short of breath definitely this potentially concern for this being associated with heart failure versus simply volume overload.  Needed 2D echocardiogram to get a full assessment of his EF, diastolic function as well as suggestion of pulmonary pressures.  Unfortunately, his volume level is totally dependent on dialysis.

## 2017-09-14 NOTE — Assessment & Plan Note (Signed)
Poorly controlled blood pressure at present on a somewhat unusual regimen with labetalol, nifedipine and clonidine along with losartan an ESRD-HD patient.  I would like to probably switch him from labetalol to carvedilol (however nephrologist and when to use metoprolol). -->  Will wait until I see results of echocardiogram and Myoview.  Question benefit of ARB however would likelihood of hypertensive heart disease, will continue for now. We may very well be stuck having to use clonidine although this is somewhat unfavorable as an option.  Would likely prefer using hydralazine/nitrate

## 2017-09-14 NOTE — Telephone Encounter (Signed)
Attempted to call pt on mobile # and no answer.  Voice mailbox was full and unable to leave message

## 2017-09-15 DIAGNOSIS — D508 Other iron deficiency anemias: Secondary | ICD-10-CM | POA: Diagnosis not present

## 2017-09-15 DIAGNOSIS — N2581 Secondary hyperparathyroidism of renal origin: Secondary | ICD-10-CM | POA: Diagnosis not present

## 2017-09-15 DIAGNOSIS — E876 Hypokalemia: Secondary | ICD-10-CM | POA: Diagnosis not present

## 2017-09-15 DIAGNOSIS — N186 End stage renal disease: Secondary | ICD-10-CM | POA: Diagnosis not present

## 2017-09-15 DIAGNOSIS — D649 Anemia, unspecified: Secondary | ICD-10-CM | POA: Diagnosis not present

## 2017-09-15 NOTE — Telephone Encounter (Signed)
Attempted to call pt on mobile # and no answer.  Unable to leave msg due to voice mailbox being full.  Letter sent to pt to call our office

## 2017-09-15 NOTE — Progress Notes (Signed)
Patient: Ryan [name] MRN: [id number] DOB: 05-01-1960 PCP: Inda Coke, PA     Subjective:  Chief Complaint  Patient presents with  . transfer of care    transfer from Inda Coke, PA-C    HPI: The patient is a 57 y.o. male who presents today for htn, cough. He is wanting to transfer care to me from [place], [place].   Hypertension: Here for follow up of hypertension.  Currently on clonidine,labetalol,losartan, nifedipine. Takes medication as prescribed and denies any side effects. Exercise includes walking. Weight has been increasing. Denies any chest pain, headaches, shortness of breath, vision changes, swelling in lower extremities. Cardiology is wanting to change him to coreg after they get echo back. I am extremely concerned with him being on clonidine with non compliant history and would rather do hydralazine. Of note he is taking nifedipine, but this is not on his medication list on hospital discharge nor in his note from cardiology.   Cough/DOE: just seen by cardiology yesterday. Echo ordered/stress test ordered. Concern for volume overload vs. Heart failure. Just seen at [facility] on 09/09/17 and had right sided thoracentesis for pleural effusion. symptoms have gotten better after this. He has his echo on 7/1 and stress test on 7/5 he thinks. I also referred him to pulmonology for follow up. He states the cough is better, but again I wonder how much of these symptoms are due to fluid overload.   CKD: saw Dr. Lorrene Reid on Tuesday. He states she didn't really say much. I asked if he asked questions and he tells me that he gave her all of the paperwork.   ACD: hgb trending down. He tells me his last hgb was "7 something" and renal doc said something about starting iron. They did not start this. He has labs every Thursday at the dialysis center. He is fatigued and short of breath, but has multiple co-morbidities that could be contributing.   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 He has been off of his myfortic for less than a week. He forgot to mention this to his his renal doctor. Still taking his tacrolimus and steroids. Also of note there is a note in epic saying his last refill of the myfortic was on 07/09/17, so im not sure how he has only been out for one week.   Review of Systems  Constitutional: Positive for fatigue.  Respiratory: Positive for cough. Negative for chest tightness and shortness of breath.   Cardiovascular: Positive for leg swelling. Negative for chest pain.  Gastrointestinal: Negative for abdominal pain, diarrhea, nausea and vomiting.  Genitourinary: Negative for dysuria and urgency.  Musculoskeletal: Negative for back pain, joint swelling and neck pain.  Neurological: Negative for dizziness and headaches.  Psychiatric/Behavioral: The patient is not nervous/anxious.     Allergies Patient is allergic to azithromycin and no known allergies.  Past Medical History Patient  has a past medical history of Allergy, Blood transfusion without reported diagnosis, Dialysis patient ([place]), ESRD (end stage renal disease) ([place]), Hyperlipidemia, and Hypertension.  Surgical History Patient  has a past surgical history that includes AV fistula placement (Left, 08/28/2016); A/V Fistulagram (Left, 12/15/2016); stent in kidneys; and IR THORACENTESIS ASP PLEURAL SPACE W/IMG GUIDE (08/22/2017).  Family History Pateint's family history includes Alcohol abuse in his maternal grandfather; Cancer in his father; Heart disease in his maternal grandmother; Heart disease (age of onset: 64) in his mother; Hypertension in his mother and sister; Kidney cancer in  his father; Learning disabilities in his paternal grandmother; Mental illness in his paternal grandmother; Stroke in his mother and paternal grandfather.  Social History Patient  reports that he has never smoked. He has never used smokeless tobacco. He reports that he does not drink  alcohol or use drugs.    Objective: Vitals:   09/16/17 1312  BP: (!) 158/100  Pulse: 83  Temp: 98.6 F (37 C)  TempSrc: Oral  SpO2: 96%  Weight: 183 lb 9.6 oz (83.3 kg)  Height: 5\' 9"  (1.753 m)    Body mass index is 27.11 kg/m.    Physical Exam  Constitutional: He appears well-developed and well-nourished.  Cardiovascular: Normal rate and regular rhythm.  2-3+ pitting edema in bilateral lower legs to mid calf.  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 He has no rales.  mildly decreased breath sounds, but no rales or crackles appreciated today   Abdominal: Soft. Bowel sounds are normal.  Vitals reviewed.      Assessment/plan: 1. Essential hypertension I don't think he really knows what he is taking. Forgot to mention his nifedipine to cardiologist even though it's in his med list. He is taking this as well as clonidine, losartan and lopressor. Will send cardiology a quick note to verify if he is supposed to be on this or not. Agree with cards, would rather do hydralazine over clonidine. Besides not liking this drug, he is so non compliant I worry about rebound hypertension with him forgetting or running out of his medication.   2. Anemia of chronic disease Needs iron transfusion since ESRD dependent on dialysis with hgb less than 10. Will refer to heme to evaluate and load/manage. Concerned with non compliance he may not follow through on this.   3. Renal transplant, status post Has not been taking his myfortic for a week; however, note in chart says last filled 07/09/17. Still on tacrolimus and prednisone. 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 He tells me Dr. Lorrene Reid fills this prescriptions and just saw her, but she didn't really answer his questions or spend much time with him. Will call her office and see what he needs to do to get this filled or see if his story is accurate as it has not  been in the past. Again, stressed he has to keep his appointments as this is really important ___Dr. Lorrene Reid office contacted and has not filled this px since 2017. Im not sure if he is being truthful or if he is just unaware of where his medications come from. Called patient and told him he needs to call [facility] for a refill or per Dr. Lorrene Reid have an appointment with her as it has been a long time since he has been seen.   Also no showed his follow up  Hospital appointment at [facility] with urologist to see if stent patent or needs to be removed. Again, stressed he has to take charge of his medical conditions and answer his phone and go to these appointment. Phone number of Dr. Rebekah [place] office given to him to make another appointment.    Over 30 minutes of this appointment was spent in counseling/coordinating care/educatin patient.   Return in about 6 months (around 03/18/2018).   Orma Flaming, MD [place]   09/16/2017

## 2017-09-16 ENCOUNTER — Encounter: Payer: Self-pay | Admitting: Family Medicine

## 2017-09-16 ENCOUNTER — Ambulatory Visit (INDEPENDENT_AMBULATORY_CARE_PROVIDER_SITE_OTHER): Payer: Medicare Other | Admitting: Family Medicine

## 2017-09-16 ENCOUNTER — Telehealth: Payer: Self-pay | Admitting: Family Medicine

## 2017-09-16 ENCOUNTER — Encounter: Payer: Medicare Other | Admitting: Family Medicine

## 2017-09-16 VITALS — BP 158/100 | HR 83 | Temp 98.6°F | Ht 69.0 in | Wt 183.6 lb

## 2017-09-16 DIAGNOSIS — Z992 Dependence on renal dialysis: Secondary | ICD-10-CM | POA: Diagnosis not present

## 2017-09-16 DIAGNOSIS — I10 Essential (primary) hypertension: Secondary | ICD-10-CM | POA: Diagnosis not present

## 2017-09-16 DIAGNOSIS — Z940 Kidney transplant status: Secondary | ICD-10-CM | POA: Diagnosis not present

## 2017-09-16 DIAGNOSIS — I871 Compression of vein: Secondary | ICD-10-CM | POA: Diagnosis not present

## 2017-09-16 DIAGNOSIS — D638 Anemia in other chronic diseases classified elsewhere: Secondary | ICD-10-CM

## 2017-09-16 DIAGNOSIS — T82858A Stenosis of vascular prosthetic devices, implants and grafts, initial encounter: Secondary | ICD-10-CM | POA: Diagnosis not present

## 2017-09-16 DIAGNOSIS — N186 End stage renal disease: Secondary | ICD-10-CM

## 2017-09-16 MED ORDER — FERROUS SULFATE 300 (60 FE) MG/5ML PO SYRP
300.0000 mg | ORAL_SOLUTION | Freq: Every day | ORAL | 3 refills | Status: DC
Start: 2017-09-16 — End: 2017-09-16

## 2017-09-16 NOTE — Telephone Encounter (Signed)
Please call [name] and speak to his nephrologists nurse (Dr. Lorrene Reid). He has been out of his myfortic for nearly a week and needs a refill. She does this for him.   Also, he needs iron and need to see if they load/do infusions before dialysis. If not, i'm going to send to hematology for iron infusions.

## 2017-09-16 NOTE — Patient Instructions (Addendum)
1) call dr. Lorrene Reid about medication refill  2) im going to talk with them about iron infusions and will let you know.   3) call urologist about [facility] as you missed your appointment for them to look at stent and possibly remove. (dr. Alona Bene)   3) will talk about blood pressure medications with your cardiologist and let you know.

## 2017-09-16 NOTE — Telephone Encounter (Signed)
Spoke to  [name] at Dr. Sanda Klein office [facility]) Pt has not been seen there or had his Myfortic filled since 2017.  Would need appt before Dr. Lorrene Reid could refill his medication.  Per Dr. Rogers Blocker, will contact pt and advise him to contact nephrologist at [place] so that he can get his Myfortic rx filled.  Will also inform pt that referral put in for hematology for iron infusions.    Called and left message with pt's daughter, [name] (on [place]) to please return my call to inform of info above.  Attempted to reach pt on his cell phone and unable to leave message due to voice mailbox being full.

## 2017-09-17 ENCOUNTER — Telehealth: Payer: Self-pay

## 2017-09-17 DIAGNOSIS — N186 End stage renal disease: Secondary | ICD-10-CM | POA: Diagnosis not present

## 2017-09-17 DIAGNOSIS — D508 Other iron deficiency anemias: Secondary | ICD-10-CM | POA: Diagnosis not present

## 2017-09-17 DIAGNOSIS — D649 Anemia, unspecified: Secondary | ICD-10-CM | POA: Diagnosis not present

## 2017-09-17 DIAGNOSIS — E876 Hypokalemia: Secondary | ICD-10-CM | POA: Diagnosis not present

## 2017-09-17 DIAGNOSIS — N2581 Secondary hyperparathyroidism of renal origin: Secondary | ICD-10-CM | POA: Diagnosis not present

## 2017-09-17 NOTE — Telephone Encounter (Signed)
Spoke to patient.  He states that he sees Dr. Lorrene Reid (nephrologist) weekly when he goes to his weekly dialysis appts at [facility].  He states that she is there doing rounds and he speaks to her each time he is there for his dialysis.  Informed pt it is crucial to contact Dr. Sanda Klein office and schedule appt w/her immediately so that he can get his rx for his Myfortic refilled.  Stressed the urgency of getting in w/Dr. Lorrene Reid ASAP.  Explained to pt the importance of this so that he does not end up in the hospital again due to kidney rejection.    Also informed pt that Dr. Rogers Blocker put in referral for pt to see hematologist for iron infusion.  Pt verbalized understanding of all of the above and had no further questions.

## 2017-09-17 NOTE — Telephone Encounter (Signed)
Spoke to [place] at [employer].  She will send refill for Myfortic to [facility] on [address]. Also spoke to pt and made him aware that rx is being sent to pharmacy.  Pt verbalized understanding and had no further questions

## 2017-09-17 NOTE — Telephone Encounter (Signed)
Spoke to [place] at Dr. Sanda Klein office [facility])  Per Arville Go, they do not see dialysis patients in the office.  Suggested I contact [facility] [address] where pt receives his dialysis for follow up.

## 2017-09-18 ENCOUNTER — Telehealth (HOSPITAL_COMMUNITY): Payer: Self-pay

## 2017-09-18 NOTE — Telephone Encounter (Signed)
Encounter complete. 

## 2017-09-19 ENCOUNTER — Other Ambulatory Visit: Payer: Self-pay

## 2017-09-19 ENCOUNTER — Emergency Department (HOSPITAL_COMMUNITY): Payer: Medicare Other

## 2017-09-19 ENCOUNTER — Encounter (HOSPITAL_COMMUNITY): Payer: Self-pay | Admitting: Emergency Medicine

## 2017-09-19 ENCOUNTER — Inpatient Hospital Stay (HOSPITAL_COMMUNITY)
Admission: EM | Admit: 2017-09-19 | Discharge: 2017-09-25 | DRG: 628 | Disposition: A | Discharge status: Home / Self Care | Payer: Medicare Other | Attending: Internal Medicine | Admitting: Internal Medicine

## 2017-09-19 DIAGNOSIS — J9601 Acute respiratory failure with hypoxia: Secondary | ICD-10-CM | POA: Diagnosis not present

## 2017-09-19 DIAGNOSIS — I10 Essential (primary) hypertension: Secondary | ICD-10-CM

## 2017-09-19 DIAGNOSIS — Z881 Allergy status to other antibiotic agents status: Secondary | ICD-10-CM

## 2017-09-19 DIAGNOSIS — Z960 Presence of urogenital implants: Secondary | ICD-10-CM | POA: Diagnosis present

## 2017-09-19 DIAGNOSIS — N186 End stage renal disease: Secondary | ICD-10-CM | POA: Diagnosis present

## 2017-09-19 DIAGNOSIS — Z590 Homelessness: Secondary | ICD-10-CM

## 2017-09-19 DIAGNOSIS — D631 Anemia in chronic kidney disease: Secondary | ICD-10-CM | POA: Diagnosis present

## 2017-09-19 DIAGNOSIS — Z940 Kidney transplant status: Secondary | ICD-10-CM

## 2017-09-19 DIAGNOSIS — R079 Chest pain, unspecified: Secondary | ICD-10-CM | POA: Diagnosis not present

## 2017-09-19 DIAGNOSIS — Z9115 Patient's noncompliance with renal dialysis: Secondary | ICD-10-CM

## 2017-09-19 DIAGNOSIS — Y838 Other surgical procedures as the cause of abnormal reaction of the patient, or of later complication, without mention of misadventure at the time of the procedure: Secondary | ICD-10-CM | POA: Diagnosis present

## 2017-09-19 DIAGNOSIS — Z992 Dependence on renal dialysis: Secondary | ICD-10-CM | POA: Diagnosis not present

## 2017-09-19 DIAGNOSIS — E785 Hyperlipidemia, unspecified: Secondary | ICD-10-CM

## 2017-09-19 DIAGNOSIS — J9811 Atelectasis: Secondary | ICD-10-CM | POA: Diagnosis present

## 2017-09-19 DIAGNOSIS — I252 Old myocardial infarction: Secondary | ICD-10-CM

## 2017-09-19 DIAGNOSIS — I150 Renovascular hypertension: Secondary | ICD-10-CM

## 2017-09-19 DIAGNOSIS — Z79899 Other long term (current) drug therapy: Secondary | ICD-10-CM

## 2017-09-19 DIAGNOSIS — I728 Aneurysm of other specified arteries: Secondary | ICD-10-CM | POA: Diagnosis present

## 2017-09-19 DIAGNOSIS — J90 Pleural effusion, not elsewhere classified: Secondary | ICD-10-CM | POA: Diagnosis present

## 2017-09-19 DIAGNOSIS — I120 Hypertensive chronic kidney disease with stage 5 chronic kidney disease or end stage renal disease: Secondary | ICD-10-CM | POA: Diagnosis present

## 2017-09-19 DIAGNOSIS — N2581 Secondary hyperparathyroidism of renal origin: Secondary | ICD-10-CM | POA: Diagnosis not present

## 2017-09-19 DIAGNOSIS — E8770 Fluid overload, unspecified: Secondary | ICD-10-CM | POA: Diagnosis not present

## 2017-09-19 DIAGNOSIS — I517 Cardiomegaly: Secondary | ICD-10-CM | POA: Diagnosis present

## 2017-09-19 DIAGNOSIS — R739 Hyperglycemia, unspecified: Secondary | ICD-10-CM | POA: Diagnosis present

## 2017-09-19 DIAGNOSIS — J810 Acute pulmonary edema: Secondary | ICD-10-CM | POA: Diagnosis not present

## 2017-09-19 DIAGNOSIS — J811 Chronic pulmonary edema: Secondary | ICD-10-CM

## 2017-09-19 DIAGNOSIS — T380X5A Adverse effect of glucocorticoids and synthetic analogues, initial encounter: Secondary | ICD-10-CM | POA: Diagnosis present

## 2017-09-19 DIAGNOSIS — R0603 Acute respiratory distress: Secondary | ICD-10-CM | POA: Diagnosis not present

## 2017-09-19 DIAGNOSIS — R0602 Shortness of breath: Secondary | ICD-10-CM | POA: Diagnosis present

## 2017-09-19 DIAGNOSIS — Z7952 Long term (current) use of systemic steroids: Secondary | ICD-10-CM

## 2017-09-19 DIAGNOSIS — Z638 Other specified problems related to primary support group: Secondary | ICD-10-CM

## 2017-09-19 DIAGNOSIS — T8249XA Other complication of vascular dialysis catheter, initial encounter: Secondary | ICD-10-CM | POA: Diagnosis present

## 2017-09-19 DIAGNOSIS — R402413 Glasgow coma scale score 13-15, at hospital admission: Secondary | ICD-10-CM | POA: Diagnosis present

## 2017-09-19 DIAGNOSIS — N185 Chronic kidney disease, stage 5: Secondary | ICD-10-CM | POA: Diagnosis not present

## 2017-09-19 DIAGNOSIS — D638 Anemia in other chronic diseases classified elsewhere: Secondary | ICD-10-CM | POA: Diagnosis present

## 2017-09-19 LAB — BASIC METABOLIC PANEL
ANION GAP: 15 (ref 5–15)
BUN: 63 mg/dL — ABNORMAL HIGH (ref 6–20)
CALCIUM: 7.6 mg/dL — AB (ref 8.9–10.3)
CO2: 25 mmol/L (ref 22–32)
Chloride: 98 mmol/L (ref 98–111)
Creatinine, Ser: 8.44 mg/dL — ABNORMAL HIGH (ref 0.61–1.24)
GFR, EST AFRICAN AMERICAN: 7 mL/min — AB (ref >60)
GFR, EST NON AFRICAN AMERICAN: 6 mL/min — AB (ref >60)
GLUCOSE: 124 mg/dL — AB (ref 70–99)
POTASSIUM: 5.5 mmol/L — AB (ref 3.5–5.1)
SODIUM: 138 mmol/L (ref 135–145)

## 2017-09-19 LAB — CBC
HCT: 31.4 % — ABNORMAL LOW (ref 39.0–52.0)
HEMOGLOBIN: 9.2 g/dL — AB (ref 13.0–17.0)
MCH: 23.3 pg — ABNORMAL LOW (ref 26.0–34.0)
MCHC: 29.3 g/dL — AB (ref 30.0–36.0)
MCV: 79.5 fL (ref 78.0–100.0)
Platelets: 333 10*3/uL (ref 150–400)
RBC: 3.95 MIL/uL — ABNORMAL LOW (ref 4.22–5.81)
RDW: 23.4 % — ABNORMAL HIGH (ref 11.5–15.5)
WBC: 7.6 10*3/uL (ref 4.0–10.5)

## 2017-09-19 LAB — MRSA PCR SCREENING: MRSA by PCR: NEGATIVE

## 2017-09-19 LAB — TROPONIN I: TROPONIN I: 0.03 ng/mL — AB (ref <0.03)

## 2017-09-19 LAB — GLUCOSE, CAPILLARY: Glucose-Capillary: 104 mg/dL — ABNORMAL HIGH (ref 70–99)

## 2017-09-19 MED ORDER — CINACALCET HCL 30 MG PO TABS
30.0000 mg | ORAL_TABLET | Freq: Every day | ORAL | Status: DC
  Administered 2017-09-20 – 2017-09-24 (×4): 30 mg via ORAL
  Filled 2017-09-19 (×4): qty 1

## 2017-09-19 MED ORDER — CHLORHEXIDINE GLUCONATE CLOTH 2 % EX PADS: 6.0000 | MEDICATED_PAD | Freq: Every day | CUTANEOUS | Status: DC

## 2017-09-19 MED ORDER — ALBUTEROL SULFATE (2.5 MG/3ML) 0.083% IN NEBU: 2.5000 mg | INHALATION_SOLUTION | RESPIRATORY_TRACT | Status: DC | PRN

## 2017-09-19 MED ORDER — ONDANSETRON HCL 4 MG PO TABS: 4.0000 mg | ORAL_TABLET | Freq: Four times a day (QID) | ORAL | Status: DC | PRN

## 2017-09-19 MED ORDER — NITROGLYCERIN 2 % TD OINT
1.0000 [in_us] | TOPICAL_OINTMENT | Freq: Once | TRANSDERMAL | Status: AC
Start: 2017-09-19 — End: 2017-09-19
  Administered 2017-09-19: 1 [in_us] via TOPICAL
  Filled 2017-09-19: qty 1

## 2017-09-19 MED ORDER — MUPIROCIN 2 % EX OINT
1.0000 "application " | TOPICAL_OINTMENT | Freq: Two times a day (BID) | CUTANEOUS | Status: DC
  Filled 2017-09-19: qty 22

## 2017-09-19 MED ORDER — LABETALOL HCL 5 MG/ML IV SOLN
20.0000 mg | Freq: Once | INTRAVENOUS | Status: AC
  Administered 2017-09-19: 20 mg via INTRAVENOUS
  Filled 2017-09-19: qty 4

## 2017-09-19 MED ORDER — BISACODYL 10 MG RE SUPP: 10.0000 mg | Freq: Every day | RECTAL | Status: DC | PRN

## 2017-09-19 MED ORDER — SENNOSIDES-DOCUSATE SODIUM 8.6-50 MG PO TABS: 1.0000 | ORAL_TABLET | Freq: Every evening | ORAL | Status: DC | PRN

## 2017-09-19 MED ORDER — MYCOPHENOLATE SODIUM 180 MG PO TBEC
360.0000 mg | DELAYED_RELEASE_TABLET | Freq: Two times a day (BID) | ORAL | Status: DC
  Administered 2017-09-20 – 2017-09-25 (×12): 360 mg via ORAL
  Filled 2017-09-19 (×12): qty 2

## 2017-09-19 MED ORDER — TACROLIMUS 1 MG PO CAPS
5.0000 mg | ORAL_CAPSULE | Freq: Two times a day (BID) | ORAL | Status: DC
  Administered 2017-09-20 – 2017-09-25 (×12): 5 mg via ORAL
  Filled 2017-09-19 (×12): qty 5

## 2017-09-19 MED ORDER — PRAVASTATIN SODIUM 40 MG PO TABS
80.0000 mg | ORAL_TABLET | Freq: Every day | ORAL | Status: DC
  Administered 2017-09-20 – 2017-09-25 (×6): 80 mg via ORAL
  Filled 2017-09-19 (×6): qty 2

## 2017-09-19 MED ORDER — ONDANSETRON HCL 4 MG/2ML IJ SOLN: 4.0000 mg | Freq: Four times a day (QID) | INTRAMUSCULAR | Status: DC | PRN

## 2017-09-19 MED ORDER — ACETAMINOPHEN 650 MG RE SUPP: 650.0000 mg | Freq: Four times a day (QID) | RECTAL | Status: DC | PRN

## 2017-09-19 MED ORDER — HYDROCODONE-ACETAMINOPHEN 5-325 MG PO TABS
1.0000 | ORAL_TABLET | ORAL | Status: DC | PRN
  Administered 2017-09-20 – 2017-09-25 (×3): 2 via ORAL
  Filled 2017-09-19 (×3): qty 2

## 2017-09-19 MED ORDER — RENA-VITE PO TABS
1.0000 | ORAL_TABLET | Freq: Every day | ORAL | Status: DC
  Administered 2017-09-20 – 2017-09-25 (×5): 1 via ORAL
  Filled 2017-09-19 (×5): qty 1

## 2017-09-19 MED ORDER — PREDNISONE 5 MG PO TABS
5.0000 mg | ORAL_TABLET | Freq: Every day | ORAL | Status: DC
  Administered 2017-09-20 – 2017-09-25 (×6): 5 mg via ORAL
  Filled 2017-09-19 (×6): qty 1

## 2017-09-19 MED ORDER — NEPRO/CARBSTEADY PO LIQD
237.0000 mL | Freq: Three times a day (TID) | ORAL | Status: DC
  Administered 2017-09-20 – 2017-09-25 (×8): 237 mL via ORAL
  Filled 2017-09-19 (×21): qty 237

## 2017-09-19 MED ORDER — GUAIFENESIN ER 600 MG PO TB12
1200.0000 mg | ORAL_TABLET | Freq: Two times a day (BID) | ORAL | Status: DC | PRN
  Administered 2017-09-20 – 2017-09-22 (×2): 1200 mg via ORAL
  Filled 2017-09-19 (×2): qty 2

## 2017-09-19 MED ORDER — HEPARIN SODIUM (PORCINE) 5000 UNIT/ML IJ SOLN
5000.0000 [IU] | Freq: Three times a day (TID) | INTRAMUSCULAR | Status: DC
  Filled 2017-09-19 (×6): qty 1

## 2017-09-19 MED ORDER — SEVELAMER CARBONATE 800 MG PO TABS
800.0000 mg | ORAL_TABLET | Freq: Three times a day (TID) | ORAL | Status: DC
  Administered 2017-09-20 – 2017-09-25 (×11): 800 mg via ORAL
  Filled 2017-09-19 (×11): qty 1

## 2017-09-19 MED ORDER — ACETAMINOPHEN 325 MG PO TABS: 650.0000 mg | ORAL_TABLET | Freq: Four times a day (QID) | ORAL | Status: DC | PRN

## 2017-09-19 NOTE — Consult Note (Signed)
Renal Service Consult Note [name] 09/19/2017 [place] Requesting Physician:  Dr Herbert Moors  Reason for Consult:  ESRD pt with resp distress HPI: The patient is a 57 y.o. year-old with hx of HTN, HL , esrd on dialysis and failed renal transplant presenting to ED due to SOB and resp distress.  CXR shows bilat pulm edema and large R basilar effusion vs consolidation.  Asked to see for ESRD.    Pt denies any CP, prod cough or fevers, chills , sweats.  No abd pain, no n/v/d.  Pt was trying to pack his car today when he started to become SOB.  TOday was HD day but pt didn't go due to severe SOB.    Started HD in about 2006, CKD due to HTN.  Had renal Tx in 2009 which lasted until 2017 when he was placed back on HD.       ROS  denies CP  no joint pain   no HA  no blurry vision  no rash  no diarrhea  no nausea/ vomiting  Past Medical History  Past Medical History:  Diagnosis Date  . Allergy   . Blood transfusion without reported diagnosis   . Dialysis patient ([place])    Tues,thurs,sat  . ESRD (end stage renal disease) ([place])   . Hyperlipidemia   . Hypertension    Past Surgical History  Past Surgical History:  Procedure Laterality Date  . A/V FISTULAGRAM Left 12/15/2016   Procedure: A/V Fistulagram - left;  Surgeon: Angelia Mould, MD;  Location: [place] [facility];  Service: Cardiovascular;  Laterality: Left;  . AV FISTULA PLACEMENT Left 08/28/2016   Procedure: LEFT UPPER  ARM ARTERIOVENOUS (AV) FISTULA CREATION;  Surgeon: Angelia Mould, MD;  Location: [facility] OR;  Service: Vascular;  Laterality: Left;  . IR THORACENTESIS ASP PLEURAL SPACE W/IMG GUIDE  08/22/2017   1.2 L -right-sided  . stent in kidneys     dec 2017   Family History  Family History  Problem Relation Age of Onset  . Hypertension Mother   . Heart disease Mother 15       By his report, he thinks that she had heart attack.  . Stroke Mother   . Cancer  Father   . Kidney cancer Father   . Hypertension Sister   . Heart disease Maternal Grandmother   . Alcohol abuse Maternal Grandfather   . Mental illness Paternal Grandmother   . Learning disabilities Paternal Grandmother        Alzheimer's   . Stroke Paternal Grandfather   . Colon cancer Neg Hx   . Colon polyps Neg Hx   . Esophageal cancer Neg Hx   . Rectal cancer Neg Hx   . Stomach cancer Neg Hx    Social History  reports that he has never smoked. He has never used smokeless tobacco. He reports that he does not drink alcohol or use drugs. Allergies  Allergies  Allergen Reactions  . Azithromycin Nausea And Vomiting and Other (See Comments)    Chest tightness   Home medications Prior to Admission medications   Medication Sig Start Date End Date Taking? Authorizing Provider  benzonatate (TESSALON) 100 MG capsule Take 1 capsule (100 mg total) by mouth every 8 (eight) hours. 08/22/17   Lacretia Leigh, MD  cloNIDine (CATAPRES) 0.2 MG tablet Take 0.1 mg by mouth 2 (two) times daily. 12/01/16   [provider]  furosemide (LASIX) 80 MG tablet  TAKE 1 TABLET (80 MG TOTAL) BY MOUTH 2 TIMES DAILY. 08/28/16   [provider]  guaiFENesin (MUCINEX) 600 MG 12 hr tablet Take 1,200 mg by mouth 2 (two) times daily as needed for to loosen phlegm.    [provider]  labetalol (NORMODYNE) 200 MG tablet Take 200 mg by mouth 2 (two) times daily.     [provider]  loratadine (CLARITIN) 10 MG tablet Take 10 mg by mouth daily as needed for allergies.    [provider]  losartan (COZAAR) 25 MG tablet TAKE 1 TABLET BY MOUTH EVERY DAY 09/10/17   Orma Flaming, MD  multivitamin (RENA-VIT) TABS tablet Take 1 tablet by mouth daily.    [provider]  mycophenolate (MYFORTIC) 360 MG TBEC EC tablet Take 360 mg by mouth 2 (two) times daily.    [provider]  NIFEdipine (PROCARDIA XL/ADALAT-CC) 60 MG 24 hr tablet Take 60 mg by mouth 2 (two) times  daily.    [provider]  Nutritional Supplements (FEEDING SUPPLEMENT, NEPRO CARB STEADY,) LIQD Take 237 mLs by mouth 3 (three) times daily between meals. 08/28/16   Lavina Hamman, MD  pravastatin (PRAVACHOL) 80 MG tablet TAKE 1 TABLET BY MOUTH EVERY DAY 09/08/17   Inda Coke, PA  predniSONE (DELTASONE) 5 MG tablet Take 5 mg by mouth daily with breakfast.    [provider]  predniSONE (DELTASONE) 50 MG tablet 1 p.o. daily x4 08/22/17   Lacretia Leigh, MD  RENAGEL 800 MG tablet Take 800-2,400 mg by mouth See admin instructions. Take 3 tablets with meals & 1 tablet with snacks. 12/01/16   [provider]  SENSIPAR 30 MG tablet Take 30 mg by mouth daily. 05/22/17   [provider]  tacrolimus (PROGRAF) 5 MG capsule Take 5 mg by mouth 2 (two) times daily.    [provider]   Liver Function Tests No results for input(s): AST, ALT, ALKPHOS, BILITOT, PROT, ALBUMIN in the last 168 hours. No results for input(s): LIPASE, AMYLASE in the last 168 hours. CBC Recent Labs  Lab 09/19/17 1135  WBC 7.6  HGB 9.2*  HCT 31.4*  MCV 79.5  PLT 315   Basic Metabolic Panel Recent Labs  Lab 09/19/17 1135  NA 138  K 5.5*  CL 98  CO2 25  GLUCOSE 124*  BUN 63*  CREATININE 8.44*  CALCIUM 7.6*   Iron/TIBC/Ferritin/ %Sat No results found for: IRON, TIBC, FERRITIN, IRONPCTSAT  Vitals:   09/19/17 1119 09/19/17 1130 09/19/17 1141 09/19/17 1145  BP: (!) 213/114 (!) 197/111  (!) 199/116  Pulse: 97 91  91  Resp: (!) 33 (!) 33  (!) 27  Temp: 98 F (36.7 C)     TempSrc: Oral     SpO2: 91% 94% 90% 98%   Exam Gen alert, a bit disheveled, not in disterss, nasal O2 No rash, cyanosis or gangrene Sclera anicteric, throat clear  +jvd mild no bruits Chest bilat basilar crackles, R base dc'd BS, no bronch BS RRR no MRG Abd soft ntnd no mass or ascites +bs GU normal male MS no joint effusions or deformity Ext 2+ pitting diffuse LE edema up to the knees, no  wounds or ulcers Neuro is alert, Ox 3 , nf    Home meds:  - clonidine 0.1 bid/ labetalol 200 bid/ losartan 25 qd/ procardia xl 60 bid  - prograf 5 bid/ myfortic 360 bid  - pravastatin/ renagel ac/ sensipar 30  - prn mucinex/ tessalon/  claritin/ MVI  Dialysis: [facility]  4h  78kg  2/2 bath LFA AVF  Heparin none  - mircera 225 last 6/20  - no other meds     Impression: 1  Vol overload/ resp distress/ pulm edema - plan HD asap upstairs today 2  ESRD - not sure compliance w/ fluids, may be losing body wt but we don't have accurate wt's yet today here 3  HTN - severe,  on 4 bp meds 4  Hx renal Tx - on meds not sure why, lost Tx about 1 yr ago 5  HL 6  MBD ckd - cont meds   Plan - as above  Kelly Splinter MD [employer] pager [phone]   09/19/2017, 4:13 PM

## 2017-09-19 NOTE — ED Provider Notes (Signed)
[place] EMERGENCY DEPARTMENT Provider Note   CSN: [id number] Arrival date & time: 09/19/17  1112     History   Chief Complaint Chief Complaint  Patient presents with  . Shortness of Breath    HPI Ryan [name] is a 57 y.o. male.  HPI Patient presents to the emergency room for evaluation of shortness of breath.  Patient has a history of chronic kidney disease.  He is on dialysis and was scheduled for dialysis today.  Patient states he was trying to pack his car up as he was moving out of an extended stay hotel.  He noticed that anytime he tried to move around or exert himself he was getting short of breath.  His symptoms progressed and he became severely short of breath and had to call EMS.  Patient was treated with breathing treatments and was started on supplemental oxygen.  He is feeling slightly better.  She has noticed some increased swelling.  Patient denies any recent fevers or cough.  No chest pain.  He did take his blood pressure medications this morning.  Past Medical History:  Diagnosis Date  . Allergy   . Blood transfusion without reported diagnosis   . Dialysis patient ([place])    Tues,thurs,sat  . ESRD (end stage renal disease) ([place])   . Hyperlipidemia   . Hypertension     Patient Active Problem List   Diagnosis Date Noted  . PND (paroxysmal nocturnal dyspnea) 09/14/2017  . DOE (dyspnea on exertion) 09/14/2017  . Chest pain with moderate risk for cardiac etiology 09/14/2017  . Pleural effusion 08/28/2017  . ESRD on dialysis ([place]) 08/27/2017  . Bloody drainage from penis 08/27/2017  . Lesion of vertebra 07/13/2017  . Renal transplant, status post 08/26/2016  . Essential hypertension 08/26/2016  . ESRD (end stage renal disease) ([place]) 08/26/2016  . Anemia of chronic disease 04/09/2016  . CKD (chronic kidney disease) 03/31/2016  . EKG abnormalities   . Acute renal transplant rejection 03/03/2016    Past Surgical History:  Procedure  Laterality Date  . A/V FISTULAGRAM Left 12/15/2016   Procedure: A/V Fistulagram - left;  Surgeon: Angelia Mould, MD;  Location: [place] [facility];  Service: Cardiovascular;  Laterality: Left;  . AV FISTULA PLACEMENT Left 08/28/2016   Procedure: LEFT UPPER  ARM ARTERIOVENOUS (AV) FISTULA CREATION;  Surgeon: Angelia Mould, MD;  Location: [facility] OR;  Service: Vascular;  Laterality: Left;  . IR THORACENTESIS ASP PLEURAL SPACE W/IMG GUIDE  08/22/2017   1.2 L -right-sided  . stent in kidneys     dec 2017        Home Medications    Prior to Admission medications   Medication Sig Start Date End Date Taking? Authorizing Provider  benzonatate (TESSALON) 100 MG capsule Take 1 capsule (100 mg total) by mouth every 8 (eight) hours. 08/22/17   Lacretia Leigh, MD  cloNIDine (CATAPRES) 0.2 MG tablet Take 0.1 mg by mouth 2 (two) times daily. 12/01/16   [provider]  furosemide (LASIX) 80 MG tablet TAKE 1 TABLET (80 MG TOTAL) BY MOUTH 2 TIMES DAILY. 08/28/16   [provider]  guaiFENesin (MUCINEX) 600 MG 12 hr tablet Take 1,200 mg by mouth 2 (two) times daily as needed for to loosen phlegm.    [provider]  labetalol (NORMODYNE) 200 MG tablet Take 200 mg by mouth 2 (two) times daily.     [provider]  loratadine (CLARITIN) 10 MG tablet Take 10  mg by mouth daily as needed for allergies.    [provider]  losartan (COZAAR) 25 MG tablet TAKE 1 TABLET BY MOUTH EVERY DAY 09/10/17   Orma Flaming, MD  multivitamin (RENA-VIT) TABS tablet Take 1 tablet by mouth daily.    [provider]  mycophenolate (MYFORTIC) 360 MG TBEC EC tablet Take 360 mg by mouth 2 (two) times daily.    [provider]  NIFEdipine (PROCARDIA XL/ADALAT-CC) 60 MG 24 hr tablet Take 60 mg by mouth 2 (two) times daily.    [provider]  Nutritional Supplements (FEEDING SUPPLEMENT, NEPRO CARB STEADY,) LIQD Take 237 mLs by mouth 3 (three) times daily  between meals. 08/28/16   Lavina Hamman, MD  pravastatin (PRAVACHOL) 80 MG tablet TAKE 1 TABLET BY MOUTH EVERY DAY 09/08/17   Inda Coke, PA  predniSONE (DELTASONE) 5 MG tablet Take 5 mg by mouth daily with breakfast.    [provider]  predniSONE (DELTASONE) 50 MG tablet 1 p.o. daily x4 08/22/17   Lacretia Leigh, MD  RENAGEL 800 MG tablet Take 800-2,400 mg by mouth See admin instructions. Take 3 tablets with meals & 1 tablet with snacks. 12/01/16   [provider]  SENSIPAR 30 MG tablet Take 30 mg by mouth daily. 05/22/17   [provider]  tacrolimus (PROGRAF) 5 MG capsule Take 5 mg by mouth 2 (two) times daily.    [provider]    Family History Family History  Problem Relation Age of Onset  . Hypertension Mother   . Heart disease Mother 68       By his report, he thinks that she had heart attack.  . Stroke Mother   . Cancer Father   . Kidney cancer Father   . Hypertension Sister   . Heart disease Maternal Grandmother   . Alcohol abuse Maternal Grandfather   . Mental illness Paternal Grandmother   . Learning disabilities Paternal Grandmother        Alzheimer's   . Stroke Paternal Grandfather   . Colon cancer Neg Hx   . Colon polyps Neg Hx   . Esophageal cancer Neg Hx   . Rectal cancer Neg Hx   . Stomach cancer Neg Hx     Social History Social History   Tobacco Use  . Smoking status: Never Smoker  . Smokeless tobacco: Never Used  Substance Use Topics  . Alcohol use: No  . Drug use: No     Allergies   Azithromycin and No known allergies   Review of Systems Review of Systems  All other systems reviewed and are negative.    Physical Exam Updated Vital Signs BP (!) 199/116   Pulse 91   Temp 98 F (36.7 C) (Oral)   Resp (!) 27   SpO2 98%   Physical Exam  Constitutional: He appears well-developed and well-nourished. No distress.  HENT:  Head: Normocephalic and atraumatic.  Right Ear: External ear normal.  Left Ear:  External ear normal.  Eyes: Conjunctivae are normal. Right eye exhibits no discharge. Left eye exhibits no discharge. No scleral icterus.  Neck: Neck supple. No tracheal deviation present.  Cardiovascular: Normal rate, regular rhythm and intact distal pulses.  Pulmonary/Chest: Accessory muscle usage present. No stridor. Tachypnea noted. No respiratory distress. He has no wheezes. He has rales in the right lower field and the left lower field.  Abdominal: Soft. Bowel sounds are normal. He exhibits no distension. There is no tenderness. There is no rebound  and no guarding.  Musculoskeletal: He exhibits edema. He exhibits no tenderness.  Edema noted in his extremities  Neurological: He is alert. He has normal strength. No cranial nerve deficit (no facial droop, extraocular movements intact, no slurred speech) or sensory deficit. He exhibits normal muscle tone. He displays no seizure activity. Coordination normal.  Skin: Skin is warm and dry. No rash noted. He is not diaphoretic.  Psychiatric: He has a normal mood and affect.  Nursing note and vitals reviewed.    ED Treatments / Results  Labs (all labs ordered are listed, but only abnormal results are displayed) Labs Reviewed  BASIC METABOLIC PANEL - Abnormal; Notable for the following components:      Result Value   Potassium 5.5 (*)    Glucose, Bld 124 (*)    BUN 63 (*)    Creatinine, Ser 8.44 (*)    Calcium 7.6 (*)    GFR calc non Af Amer 6 (*)    GFR calc Af Amer 7 (*)    All other components within normal limits  CBC - Abnormal; Notable for the following components:   RBC 3.95 (*)    Hemoglobin 9.2 (*)    HCT 31.4 (*)    MCH 23.3 (*)    MCHC 29.3 (*)    RDW 23.4 (*)    All other components within normal limits  TROPONIN I - Abnormal; Notable for the following components:   Troponin I 0.03 (*)    All other components within normal limits    EKG EKG Interpretation  Date/Time:  Saturday September 19 2017 11:20:02  EDT Ventricular Rate:  98 PR Interval:    QRS Duration: 94 QT Interval:  383 QTC Calculation: 489 R Axis:   106 Text Interpretation:  Sinus rhythm Anteroseptal infarct, age indeterminate No significant change since last tracing Confirmed by Dorie Rank [id number]) on 09/19/2017 11:22:57 AM   Radiology Dg Chest Port 1 View  Result Date: 09/19/2017 CLINICAL DATA:  Chest pain EXAM: PORTABLE CHEST 1 VIEW COMPARISON:  August 22, 2017 FINDINGS: No pneumothorax. Diffuse interstitial opacities. Focal infiltrate in the right base. Possible small right effusion. The cardiomediastinal silhouette is stable. No other acute abnormalities. IMPRESSION: 1. Cardiomegaly and mild interstitial opacities suggesting edema. 2. Focal opacity in the right base may represent a superimposed pneumonia versus a right pleural effusion with underlying atelectasis. Recommend clinical correlation and follow-up to resolution. Electronically Signed   By: Dorise Bullion III M.D   On: 09/19/2017 12:09    Procedures .Critical Care Performed by: Dorie Rank, MD Authorized by: Dorie Rank, MD   Critical care provider statement:    Critical care time (minutes):  35   Critical care was time spent personally by me on the following activities:  Discussions with consultants, evaluation of patient's response to treatment, examination of patient, ordering and performing treatments and interventions, ordering and review of laboratory studies, ordering and review of radiographic studies, pulse oximetry, re-evaluation of patient's condition, obtaining history from patient or surrogate and review of old charts   (including critical care time)  Medications Ordered in ED Medications  labetalol (NORMODYNE,TRANDATE) injection 20 mg (has no administration in time range)  nitroGLYCERIN (NITROGLYN) 2 % ointment 1 inch (1 inch Topical Given 09/19/17 1211)     Initial Impression / Assessment and Plan / ED Course  I have reviewed the triage vital signs  and the nursing notes.  Pertinent labs & imaging results that were available during my care of the patient  were reviewed by me and considered in my medical decision making (see chart for details).  Clinical Course as of Sep 20 1519  Sat Sep 19, 2017  1520 D/w Dr Jonnie Finner.  Will see pt regarding need for dialysis.   [JK]    Clinical Course User Index [JK] Dorie Rank, MD   Patient presented to the emergency room for evaluation of shortness of breath.  Patient was scheduled to go to dialysis today but he became too short of breath.  In the ED he had an oxygen requirement and was tachypneic.  Chest x-ray showed findings consistent with pulmonary edema.  Chest x-ray suggest the possibility of a superimposed pneumonia versus a pleural effusion with atelectasis.  At this point I favor atelectasis and pleural effusion.  Treated with nitrrates and labetalol for htn.  Plan on admission for further treatment.  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

## 2017-09-19 NOTE — H&P (Addendum)
History and Physical    Ryan [name]:[id number] DOB: 1960/12/23 DOA: 09/19/2017   PCP: Inda Coke, PA   Patient coming from:  Home    Chief Complaint: Shortness of breath  HPI: Ryan [name] is a 57 y.o. 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  In review, he was trying to pack his car as he was moving to an extended stay hotel, noticing worsening shortness of breath with minimal exertion and worsening lower extremity edema bilaterally.  Symptoms progressed, and called EMS.  He denies any recent fevers, chills, or productive cough.  He denies any chest pain or palpitations.  His last blood pressure medications were taken early this morning a.m.  On transport, he was started on breathing treatments, and was started on supplemental oxygen, but still was short of breath.  He denies any abdominal pain, or diarrhea.  He denies any calf pain.  No unilateral weakness.  No confusion is reported.  He denies any tobacco, alcohol or recreational drug use.  ED Course:  BP (!) 199/116   Pulse 91   Temp 98 F (36.7 C) (Oral)   Resp (!) 27   SpO2 98%   Sodium 138, potassium 5.5, bicarb 25, glucose 124, BUN 63, creatinine 8.44, GFR 6, troponin 0 0.03, white count 7.6, hemoglobin 9.2, platelets 333,  chest x-rayCardiomegaly and mild interstitial opacities suggesting edema . Focal opacity in the right base may represent likely right pleural effusion with underlying atelectasis EKG    Sinus rhythm Anteroseptal infarct, age indeterminate No significant change since last tracing Nephrology, Dr. Jonnie Finner, has consulted the patient, and is planning to do dialysis today for improvement of his symptoms, and may need a second dialysis tomorrow, pending on how he responds to the  procedure today   Review of Systems:  As per HPI otherwise all other systems reviewed and are negative  Past Medical History:  Diagnosis Date  . Allergy   . Blood transfusion without reported diagnosis   . Dialysis patient ([place])    Tues,thurs,sat  . ESRD (end stage renal disease) ([place])   . Hyperlipidemia   . Hypertension     Past Surgical History:  Procedure Laterality Date  . A/V FISTULAGRAM Left 12/15/2016   Procedure: A/V Fistulagram - left;  Surgeon: Angelia Mould, MD;  Location: [facility];  Service: Cardiovascular;  Laterality: Left;  . AV FISTULA PLACEMENT Left 08/28/2016   Procedure: LEFT UPPER  ARM ARTERIOVENOUS (AV) FISTULA CREATION;  Surgeon: Angelia Mould, MD;  Location: [facility];  Service: Vascular;  Laterality: Left;  . IR THORACENTESIS ASP PLEURAL SPACE W/IMG GUIDE  08/22/2017   1.2 L -right-sided  . stent in kidneys     dec 2017    Social History Social History   Socioeconomic History  . Marital status: Divorced    Spouse name: Not on file  . Number of children: Not on file  . Years of education: Not on file  . Highest education level: Not on file  Occupational History  . Not on file  Social Needs  . Financial resource strain: Not on file  . Food insecurity:    Worry: Not on file    Inability: Not on file  . Transportation needs:    Medical: Not on file    Non-medical: Not  on file  Tobacco Use  . Smoking status: Never Smoker  . Smokeless tobacco: Never Used  Substance and Sexual Activity  . Alcohol use: No  . Drug use: No  . Sexual activity: Not on file  Lifestyle  . Physical activity:    Days per week: Not on file    Minutes per session: Not on file  . Stress: Not on file  Relationships  . Social connections:    Talks on phone: Not on file    Gets together: Not on file    Attends religious service: Not on file    Active member of club or organization: Not on file    Attends meetings of clubs or organizations: Not  on file    Relationship status: Not on file  . Intimate partner violence:    Fear of current or ex partner: Not on file    Emotionally abused: Not on file    Physically abused: Not on file    Forced sexual activity: Not on file  Other Topics Concern  . Not on file  Social History Narrative   Divorced   Does Chiropodist work   3 years of college.   Does not drink, does not smoke.  Does not use illicit drugs.   Son in nursing school at [employer]     Allergies  Allergen Reactions  . Azithromycin Nausea And Vomiting    Chest tightness  . No Known Allergies     Family History  Problem Relation Age of Onset  . Hypertension Mother   . Heart disease Mother 1       By his report, he thinks that she had heart attack.  . Stroke Mother   . Cancer Father   . Kidney cancer Father   . Hypertension Sister   . Heart disease Maternal Grandmother   . Alcohol abuse Maternal Grandfather   . Mental illness Paternal Grandmother   . Learning disabilities Paternal Grandmother        Alzheimer's   . Stroke Paternal Grandfather   . Colon cancer Neg Hx   . Colon polyps Neg Hx   . Esophageal cancer Neg Hx   . Rectal cancer Neg Hx   . Stomach cancer Neg Hx        Prior to Admission medications   Medication Sig Start Date End Date Taking? Authorizing Provider  benzonatate (TESSALON) 100 MG capsule Take 1 capsule (100 mg total) by mouth every 8 (eight) hours. 08/22/17   Lacretia Leigh, MD  cloNIDine (CATAPRES) 0.2 MG tablet Take 0.1 mg by mouth 2 (two) times daily. 12/01/16   [provider]  furosemide (LASIX) 80 MG tablet TAKE 1 TABLET (80 MG TOTAL) BY MOUTH 2 TIMES DAILY. 08/28/16   [provider]  guaiFENesin (MUCINEX) 600 MG 12 hr tablet Take 1,200 mg by mouth 2 (two) times daily as needed for to loosen phlegm.    [provider]  labetalol (NORMODYNE) 200 MG tablet Take 200 mg by mouth 2 (two) times daily.     [provider]  loratadine (CLARITIN)  10 MG tablet Take 10 mg by mouth daily as needed for allergies.    [provider]  losartan (COZAAR) 25 MG tablet TAKE 1 TABLET BY MOUTH EVERY DAY 09/10/17   Orma Flaming, MD  multivitamin (RENA-VIT) TABS tablet Take 1 tablet by mouth daily.    [provider]  mycophenolate (MYFORTIC) 360 MG TBEC EC tablet Take 360 mg by mouth  2 (two) times daily.    [provider]  NIFEdipine (PROCARDIA XL/ADALAT-CC) 60 MG 24 hr tablet Take 60 mg by mouth 2 (two) times daily.    [provider]  Nutritional Supplements (FEEDING SUPPLEMENT, NEPRO CARB STEADY,) LIQD Take 237 mLs by mouth 3 (three) times daily between meals. 08/28/16   Lavina Hamman, MD  pravastatin (PRAVACHOL) 80 MG tablet TAKE 1 TABLET BY MOUTH EVERY DAY 09/08/17   Inda Coke, PA  predniSONE (DELTASONE) 5 MG tablet Take 5 mg by mouth daily with breakfast.    [provider]  predniSONE (DELTASONE) 50 MG tablet 1 p.o. daily x4 08/22/17   Lacretia Leigh, MD  RENAGEL 800 MG tablet Take 800-2,400 mg by mouth See admin instructions. Take 3 tablets with meals & 1 tablet with snacks. 12/01/16   [provider]  SENSIPAR 30 MG tablet Take 30 mg by mouth daily. 05/22/17   [provider]  tacrolimus (PROGRAF) 5 MG capsule Take 5 mg by mouth 2 (two) times daily.    [provider]     Physical Exam:  Vitals:   09/19/17 1119 09/19/17 1130 09/19/17 1141 09/19/17 1145  BP: (!) 213/114 (!) 197/111  (!) 199/116  Pulse: 97 91  91  Resp: (!) 33 (!) 33  (!) 27  Temp: 98 F (36.7 C)     TempSrc: Oral     SpO2: 91% 94% 90% 98%   Constitutional: Patient is very short of breath, moderate degree of discomfort, alert, able to answer questions but answers with very short sentences.   Eyes: PERRL, lids and conjunctivae normal ENMT: Mucous membranes are moist, without exudate or lesions  Neck: Positive for centimeter JVD supple, no masses, no thyromegaly Respiratory: Remarkable for  accessory muscle use, without stridor.  He is tachypneic.  No wheezes arousable.  There are bilateral rales and decreased breath sounds on the right.   Abdominal: Soft. Bowel sounds are normal. He exhibits no distension. There is no tenderness. There is no rebound and no guarding.  Cardiovascular: Regular rate and rhythm,  murmur, rubs or gallops.  Bilateral 2-3+ lower extremity edema. 2+ pedal pulses. No carotid bruits.  He has left AVG Abdomen: Soft, non tender, No hepatosplenomegaly. Bowel sounds positive.  Musculoskeletal: no clubbing / cyanosis. Moves all extremities Skin: no jaundice, No lesions.  Neurologic: Sensation intact  Strength equal in all extremities Psychiatric:   Alert and oriented x 3.  Anxious to shortness of breath   Labs on Admission: I have personally reviewed following labs and imaging studies  CBC: Recent Labs  Lab 09/19/17 1135  WBC 7.6  HGB 9.2*  HCT 31.4*  MCV 79.5  PLT 474    Basic Metabolic Panel: Recent Labs  Lab 09/19/17 1135  NA 138  K 5.5*  CL 98  CO2 25  GLUCOSE 124*  BUN 63*  CREATININE 8.44*  CALCIUM 7.6*    GFR: Estimated Creatinine Clearance: 9.8 mL/min (A) (by C-G formula based on SCr of 8.44 mg/dL (H)).  Liver Function Tests: No results for input(s): AST, ALT, ALKPHOS, BILITOT, PROT, ALBUMIN in the last 168 hours. No results for input(s): LIPASE, AMYLASE in the last 168 hours. No results for input(s): AMMONIA in the last 168 hours.  Coagulation Profile: No results for input(s): INR, PROTIME in the last 168 hours.  Cardiac Enzymes: Recent Labs  Lab 09/19/17 1135  TROPONINI 0.03*    BNP (last 3 results) No results for input(s): PROBNP in the last 8760  hours.  HbA1C: No results for input(s): HGBA1C in the last 72 hours.  CBG: No results for input(s): GLUCAP in the last 168 hours.  Lipid Profile: No results for input(s): CHOL, HDL, LDLCALC, TRIG, CHOLHDL, LDLDIRECT in the last 72 hours.  Thyroid Function  Tests: No results for input(s): TSH, T4TOTAL, FREET4, T3FREE, THYROIDAB in the last 72 hours.  Anemia Panel: No results for input(s): VITAMINB12, FOLATE, FERRITIN, TIBC, IRON, RETICCTPCT in the last 72 hours.  Urine analysis:    Component Value Date/Time   COLORURINE RED (A) 07/12/2017 0940   APPEARANCEUR CLOUDY (A) 07/12/2017 0940   LABSPEC  07/12/2017 0940    TEST NOT REPORTED DUE TO COLOR INTERFERENCE OF URINE PIGMENT   PHURINE  07/12/2017 0940    TEST NOT REPORTED DUE TO COLOR INTERFERENCE OF URINE PIGMENT   GLUCOSEU (A) 07/12/2017 0940    TEST NOT REPORTED DUE TO COLOR INTERFERENCE OF URINE PIGMENT   HGBUR (A) 07/12/2017 0940    TEST NOT REPORTED DUE TO COLOR INTERFERENCE OF URINE PIGMENT   BILIRUBINUR (A) 07/12/2017 0940    TEST NOT REPORTED DUE TO COLOR INTERFERENCE OF URINE PIGMENT   KETONESUR (A) 07/12/2017 0940    TEST NOT REPORTED DUE TO COLOR INTERFERENCE OF URINE PIGMENT   PROTEINUR (A) 07/12/2017 0940    TEST NOT REPORTED DUE TO COLOR INTERFERENCE OF URINE PIGMENT   NITRITE (A) 07/12/2017 0940    TEST NOT REPORTED DUE TO COLOR INTERFERENCE OF URINE PIGMENT   LEUKOCYTESUR (A) 07/12/2017 0940    TEST NOT REPORTED DUE TO COLOR INTERFERENCE OF URINE PIGMENT    Sepsis Labs: @LABRCNTIP (procalcitonin:4,lacticidven:4) )No results found for this or any previous visit (from the past 240 hour(s)).   Radiological Exams on Admission: Dg Chest Port 1 View  Result Date: 09/19/2017 CLINICAL DATA:  Chest pain EXAM: PORTABLE CHEST 1 VIEW COMPARISON:  August 22, 2017 FINDINGS: No pneumothorax. Diffuse interstitial opacities. Focal infiltrate in the right base. Possible small right effusion. The cardiomediastinal silhouette is stable. No other acute abnormalities. IMPRESSION: 1. Cardiomegaly and mild interstitial opacities suggesting edema. 2. Focal opacity in the right base may represent a superimposed pneumonia versus a right pleural effusion with underlying atelectasis. Recommend  clinical correlation and follow-up to resolution. Electronically Signed   By: Dorise Bullion III M.D   On: 09/19/2017 12:09    EKG: Independently reviewed.  Assessment/Plan Active Problems:   Hypertension   ESRD (end stage renal disease) (HCC)   Anemia of chronic disease   Pleural effusion   Hyperlipidemia   Shortness of breath   Acute hypoxic respiratory failure in the setting of  pulmonary edema with volume overload due to missing dialysis today/ESRD on HD T, Th S .  Creatinine 8.44, with BUN 63, potassium 5.5, sodium 138.  Bonnie negative patient status post kidney transplant, on Prograf, Myfortic, and prednisone.  Not hypoxic.  Chest x-ray with findings consistent with pulmonary edema, and possible pleural effusion with atelectasis. White count is normal at 7.6.  Patient is afebrile.  Prior to presentation, the patient was given 1 nebulizer treatment, and was placed on oxygen, he also received Lasix. Telemetry observation  Will defer diuresis to Renal Service, he will have emergent dialysis today under the care of Dr. Jonnie Finner, appreciate involvement Continue OP meds after dialysis monitor I/Os daily weights Continue oxygen Continue Prograf, Myfortic, and prednisone Continue Nebs prn for wheezing  Patient is due for 2D echo on 09/21/2017 as outpatient   Hypertension BP  199/116  Pulse 91  Patient last took his blood pressure medications very early this morning, and has received 1 dose of labetalol at the ER.  He is to undergo dialysis, hopefully with good improvement of his blood pressure.   We will hold his meds for now, can resume in the morning  Hyperlipidemia Continue home statins  Steroid-induced hyperglycemia, glucose is 124 Check CBG twice daily  Anemia of chronic disease Hemoglobin on admission 9.2.  He is at baseline, he is not on iron replenishment.  He is to be seen by hematology as an outpatient according to these notes.  Repeat CBC in am  No transfusion is  indicated at this time Check anemia panel     DVT prophylaxis: Heparin subcu Code Status:    Full Family Communication:  Discussed with patient Disposition Plan: Expect patient to be discharged to home after condition improves Consults called:    Nephrology, Dr. Jonnie Finner Admission status: Telemetry observation   Sharene Butters, PA-C [facility]   Amion text  [phone]   09/19/2017, 3:58 PM

## 2017-09-19 NOTE — ED Triage Notes (Signed)
Patient arrived from home, he reports shortness of breath since last night. He did not go to dialysis today because of his symptoms. Denies chest pain, EMS gave 10mg  Albuterol.

## 2017-09-19 NOTE — ED Notes (Signed)
Nephro MD at bedside

## 2017-09-20 ENCOUNTER — Observation Stay (HOSPITAL_COMMUNITY): Payer: Medicare Other

## 2017-09-20 DIAGNOSIS — D649 Anemia, unspecified: Secondary | ICD-10-CM | POA: Diagnosis not present

## 2017-09-20 DIAGNOSIS — E785 Hyperlipidemia, unspecified: Secondary | ICD-10-CM | POA: Diagnosis not present

## 2017-09-20 DIAGNOSIS — J811 Chronic pulmonary edema: Secondary | ICD-10-CM | POA: Diagnosis not present

## 2017-09-20 DIAGNOSIS — I728 Aneurysm of other specified arteries: Secondary | ICD-10-CM | POA: Diagnosis not present

## 2017-09-20 DIAGNOSIS — Z9115 Patient's noncompliance with renal dialysis: Secondary | ICD-10-CM | POA: Diagnosis not present

## 2017-09-20 DIAGNOSIS — Z940 Kidney transplant status: Secondary | ICD-10-CM | POA: Diagnosis not present

## 2017-09-20 DIAGNOSIS — R739 Hyperglycemia, unspecified: Secondary | ICD-10-CM | POA: Diagnosis present

## 2017-09-20 DIAGNOSIS — I120 Hypertensive chronic kidney disease with stage 5 chronic kidney disease or end stage renal disease: Secondary | ICD-10-CM | POA: Diagnosis not present

## 2017-09-20 DIAGNOSIS — I252 Old myocardial infarction: Secondary | ICD-10-CM | POA: Diagnosis not present

## 2017-09-20 DIAGNOSIS — R0603 Acute respiratory distress: Secondary | ICD-10-CM | POA: Diagnosis not present

## 2017-09-20 DIAGNOSIS — T8249XA Other complication of vascular dialysis catheter, initial encounter: Secondary | ICD-10-CM | POA: Diagnosis not present

## 2017-09-20 DIAGNOSIS — T82898A Other specified complication of vascular prosthetic devices, implants and grafts, initial encounter: Secondary | ICD-10-CM | POA: Diagnosis not present

## 2017-09-20 DIAGNOSIS — Z79899 Other long term (current) drug therapy: Secondary | ICD-10-CM | POA: Diagnosis not present

## 2017-09-20 DIAGNOSIS — Z992 Dependence on renal dialysis: Secondary | ICD-10-CM | POA: Diagnosis not present

## 2017-09-20 DIAGNOSIS — Z590 Homelessness: Secondary | ICD-10-CM | POA: Diagnosis not present

## 2017-09-20 DIAGNOSIS — N186 End stage renal disease: Secondary | ICD-10-CM | POA: Diagnosis not present

## 2017-09-20 DIAGNOSIS — I729 Aneurysm of unspecified site: Secondary | ICD-10-CM | POA: Diagnosis not present

## 2017-09-20 DIAGNOSIS — D631 Anemia in chronic kidney disease: Secondary | ICD-10-CM | POA: Diagnosis present

## 2017-09-20 DIAGNOSIS — E876 Hypokalemia: Secondary | ICD-10-CM | POA: Diagnosis not present

## 2017-09-20 DIAGNOSIS — T8612 Kidney transplant failure: Secondary | ICD-10-CM | POA: Diagnosis not present

## 2017-09-20 DIAGNOSIS — I351 Nonrheumatic aortic (valve) insufficiency: Secondary | ICD-10-CM | POA: Diagnosis not present

## 2017-09-20 DIAGNOSIS — T380X5A Adverse effect of glucocorticoids and synthetic analogues, initial encounter: Secondary | ICD-10-CM | POA: Diagnosis present

## 2017-09-20 DIAGNOSIS — E8770 Fluid overload, unspecified: Secondary | ICD-10-CM | POA: Diagnosis not present

## 2017-09-20 DIAGNOSIS — R402413 Glasgow coma scale score 13-15, at hospital admission: Secondary | ICD-10-CM | POA: Diagnosis present

## 2017-09-20 DIAGNOSIS — Z638 Other specified problems related to primary support group: Secondary | ICD-10-CM | POA: Diagnosis not present

## 2017-09-20 DIAGNOSIS — I10 Essential (primary) hypertension: Secondary | ICD-10-CM | POA: Diagnosis not present

## 2017-09-20 DIAGNOSIS — Z960 Presence of urogenital implants: Secondary | ICD-10-CM | POA: Diagnosis present

## 2017-09-20 DIAGNOSIS — I150 Renovascular hypertension: Secondary | ICD-10-CM | POA: Diagnosis not present

## 2017-09-20 DIAGNOSIS — J90 Pleural effusion, not elsewhere classified: Secondary | ICD-10-CM | POA: Diagnosis not present

## 2017-09-20 DIAGNOSIS — Y838 Other surgical procedures as the cause of abnormal reaction of the patient, or of later complication, without mention of misadventure at the time of the procedure: Secondary | ICD-10-CM | POA: Diagnosis present

## 2017-09-20 DIAGNOSIS — J9601 Acute respiratory failure with hypoxia: Secondary | ICD-10-CM | POA: Diagnosis not present

## 2017-09-20 DIAGNOSIS — D638 Anemia in other chronic diseases classified elsewhere: Secondary | ICD-10-CM | POA: Diagnosis not present

## 2017-09-20 DIAGNOSIS — J810 Acute pulmonary edema: Secondary | ICD-10-CM | POA: Diagnosis not present

## 2017-09-20 DIAGNOSIS — I517 Cardiomegaly: Secondary | ICD-10-CM | POA: Diagnosis not present

## 2017-09-20 DIAGNOSIS — R0602 Shortness of breath: Secondary | ICD-10-CM | POA: Diagnosis not present

## 2017-09-20 DIAGNOSIS — N2581 Secondary hyperparathyroidism of renal origin: Secondary | ICD-10-CM | POA: Diagnosis not present

## 2017-09-20 DIAGNOSIS — J9811 Atelectasis: Secondary | ICD-10-CM | POA: Diagnosis not present

## 2017-09-20 LAB — BASIC METABOLIC PANEL
Anion gap: 13 (ref 5–15)
BUN: 45 mg/dL — ABNORMAL HIGH (ref 6–20)
CHLORIDE: 97 mmol/L — AB (ref 98–111)
CO2: 27 mmol/L (ref 22–32)
CREATININE: 7 mg/dL — AB (ref 0.61–1.24)
Calcium: 8 mg/dL — ABNORMAL LOW (ref 8.9–10.3)
GFR calc non Af Amer: 8 mL/min — ABNORMAL LOW (ref >60)
GFR, EST AFRICAN AMERICAN: 9 mL/min — AB (ref >60)
GLUCOSE: 142 mg/dL — AB (ref 70–99)
Potassium: 4.4 mmol/L (ref 3.5–5.1)
Sodium: 137 mmol/L (ref 135–145)

## 2017-09-20 LAB — CBC
HCT: 32.1 % — ABNORMAL LOW (ref 39.0–52.0)
Hemoglobin: 9.4 g/dL — ABNORMAL LOW (ref 13.0–17.0)
MCH: 22.9 pg — AB (ref 26.0–34.0)
MCHC: 29.3 g/dL — AB (ref 30.0–36.0)
MCV: 78.3 fL (ref 78.0–100.0)
Platelets: 328 10*3/uL (ref 150–400)
RBC: 4.1 MIL/uL — AB (ref 4.22–5.81)
RDW: 23.6 % — ABNORMAL HIGH (ref 11.5–15.5)
WBC: 7.3 10*3/uL (ref 4.0–10.5)

## 2017-09-20 LAB — GLUCOSE, CAPILLARY: Glucose-Capillary: 120 mg/dL — ABNORMAL HIGH (ref 70–99)

## 2017-09-20 MED ORDER — HYDRALAZINE HCL 20 MG/ML IJ SOLN
10.0000 mg | Freq: Four times a day (QID) | INTRAMUSCULAR | Status: DC | PRN
  Administered 2017-09-20 – 2017-09-25 (×4): 10 mg via INTRAVENOUS
  Filled 2017-09-20 (×5): qty 1

## 2017-09-20 MED ORDER — SODIUM CHLORIDE 0.9 % IV SOLN: 100.0000 mL | INTRAVENOUS | Status: DC | PRN

## 2017-09-20 MED ORDER — DARBEPOETIN ALFA 200 MCG/0.4ML IJ SOSY: 200.0000 ug | PREFILLED_SYRINGE | INTRAMUSCULAR | Status: DC

## 2017-09-20 MED ORDER — LABETALOL HCL 5 MG/ML IV SOLN
10.0000 mg | Freq: Once | INTRAVENOUS | Status: AC
  Administered 2017-09-20: 10 mg via INTRAVENOUS
  Filled 2017-09-20: qty 4

## 2017-09-20 MED ORDER — LIDOCAINE-PRILOCAINE 2.5-2.5 % EX CREA: 1.0000 "application " | TOPICAL_CREAM | CUTANEOUS | Status: DC | PRN

## 2017-09-20 MED ORDER — PENTAFLUOROPROP-TETRAFLUOROETH EX AERO: 1.0000 "application " | INHALATION_SPRAY | CUTANEOUS | Status: DC | PRN

## 2017-09-20 NOTE — Progress Notes (Signed)
Patients BP 176/103. RN notified on call [facility] hospitalist. Awaiting orders or call back.  Ermalinda Memos, RN

## 2017-09-20 NOTE — Progress Notes (Addendum)
Subjective:  Still some cough but breathing better after HD yest   Objective Vital signs in last 24 hours: Vitals:   09/20/17 0156 09/20/17 0545 09/20/17 0625 09/20/17 0938  BP: (!) 167/95 (!) 176/103 (!) 179/97 (!) 164/86  Pulse: 87 87  91  Resp: 20 20  18   Temp: 98.7 F (37.1 C) 98.8 F (37.1 C)    TempSrc: Oral Oral    SpO2: 98% 95%  99%  Weight:       Weight change:   Physical Exam: General:alert AAM , NAD  OX3, appropriate  Heart: RRR, no mrg Lungs: bilat base crackles  Abdomen: obese soft , nt, nd  Extremities: bilat 3+ pedal edema  Dialysis Access: pos bruit    OPDialysis: [facility]  4h  78kg  2/2 bath LFA AVF  Heparin none  - mircera 225 last 6/20  - no other meds  Problem/Plan 1  Vol overload/ resp distress/ pulm edema - HD yest 4l uf  And bp still up still volume overloaded with cough pedal edema , hd today and tomor for vol removal, wt loss in setting of excess / lower edw as op  Still up 2.5 kg >edw post hd  2  ESRD - HD  TTS poor  compliance w/ fluids, and may be losing body wt but we don't have accurate wt's yet today here 3  HTN - severe, element of excess vol also/  on 4 bp meds 4  Hx renal Tx - on meds not sure why, lost Tx about 1 yr ago 5 Anemia of ESRD - hgfb 9.4 due  ESA next week on hd Thurs 6  MBD ckd - cont meds 7 HL - meds   Ernest Haber, PA-C [facility] Beeper [phone] 09/20/2017,10:03 AM  LOS: 0 days   Pt seen, examined and agree w A/P as above. Was vol overloaded and is probably losing body wt and needs lowering of dry wt.  Still symptomatic after 4L off yest, now 2kg over dry wt.  Hd again today and tomorrow, get vol down, d/c home when resp status to baseline.   Kelly Splinter MD [employer] pager [phone]   09/20/2017, 12:59 PM    Labs: Basic Metabolic Panel: Recent Labs  Lab 09/19/17 1135 09/20/17 0430  NA 138 137  K 5.5* 4.4  CL 98 97*  CO2 25 27  GLUCOSE 124* 142*  BUN 63* 45*  CREATININE  8.44* 7.00*  CALCIUM 7.6* 8.0*   Liver Function Tests: No results for input(s): AST, ALT, ALKPHOS, BILITOT, PROT, ALBUMIN in the last 168 hours. No results for input(s): LIPASE, AMYLASE in the last 168 hours. No results for input(s): AMMONIA in the last 168 hours. CBC: Recent Labs  Lab 09/19/17 1135 09/20/17 0430  WBC 7.6 7.3  HGB 9.2* 9.4*  HCT 31.4* 32.1*  MCV 79.5 78.3  PLT 333 328   Cardiac Enzymes: Recent Labs  Lab 09/19/17 1135  TROPONINI 0.03*   CBG: Recent Labs  Lab 09/19/17 1708 09/20/17 0608  GLUCAP 104* 120*    Studies/Results: Dg Chest Port 1 View  Result Date: 09/19/2017 CLINICAL DATA:  Chest pain EXAM: PORTABLE CHEST 1 VIEW COMPARISON:  August 22, 2017 FINDINGS: No pneumothorax. Diffuse interstitial opacities. Focal infiltrate in the right base. Possible small right effusion. The cardiomediastinal silhouette is stable. No other acute abnormalities. IMPRESSION: 1. Cardiomegaly and mild interstitial opacities suggesting edema. 2. Focal opacity in the right base may represent a superimposed pneumonia versus  a right pleural effusion with underlying atelectasis. Recommend clinical correlation and follow-up to resolution. Electronically Signed   By: Dorise Bullion III M.D   On: 09/19/2017 12:09   Medications:  . cinacalcet  30 mg Oral Q breakfast  . [START ON 09/22/2017] darbepoetin (ARANESP) injection - DIALYSIS  200 mcg Intravenous Q Tue-HD  . feeding supplement (NEPRO CARB STEADY)  237 mL Oral TID BM  . heparin  5,000 Units Subcutaneous Q8H  . multivitamin  1 tablet Oral Daily  . mycophenolate  360 mg Oral BID  . pravastatin  80 mg Oral QHS  . predniSONE  5 mg Oral Q breakfast  . sevelamer carbonate  800 mg Oral TID WC  . tacrolimus  5 mg Oral BID

## 2017-09-20 NOTE — Progress Notes (Signed)
RN rechecked patients BP after administration of PRN hydralazine 10mg .   BP 179/97. RN notified on call Hospitalist NP.  Order for 10mg  of Labetalol placed. RN administered at [phone]. Will inform oncoming RN to recheck BP.  Ermalinda Memos, RN

## 2017-09-20 NOTE — Progress Notes (Signed)
PROGRESS NOTE  Ryan [name] IRW:[id number] DOB: 1961/03/11 DOA: 09/19/2017 PCP: Inda Coke, PA  HPI/Recap of past 24 hours:  Patient is seen in HD uint,  He report feeling better  Assessment/Plan: Active Problems:   Hypertension   ESRD (end stage renal disease) ([place])   Anemia of chronic disease   Pleural effusion   Hyperlipidemia   Shortness of breath   Acute respiratory distress  Acute hypoxic respiratory failure in the setting of  pulmonary edema with volume overload due to missing dialysis today/ESRD on HD T, Th S . Nephrology input appreciated, patient received dialysis on 6/29 and today, plan to have dialysis tomorrow as well per nephrology. Wean oxygen as tolerated  (baseline no o2 dependent)  Recurrent right sided pleural effusion He is s/p Thoracentesis 08/28/17: Removed 1200 ml Pleural analysis basically unremarkable; culture results negative , done at [facility] hospital  Cardiomagly: recurrent pulmonary edema He is recently referred to cardiology Dr Ellyn Hack who plan to have echocardiogram done on 7/1, then cardiac nuclear scan on 7/3 Will notify cardiology regarding patient's hospitalization    H/o living donor renal transplant in 2010: failed,  back on dialysis  He remained to be on Prograf, Myfortic, and prednisone, he is followed by  transplant nephrology at [facility] He was recently evaluated by transplant nephrology at [facility] when he was hospitalized from 6/6-6/10  Anemia of chronic disease hgb stable around 9  H/o Bloody drainage from penis, chronic, intermittent  H/o right ureter stent in 2017 He is followed by [facility]  HTN:  He was recently evaluated by cardiology Dr Ellyn Hack who plan to continue adjust his blood pressure meds   Code Status: full  Family Communication: patient   Disposition Plan: not ready to discharge   Consultants:  nephrology  Procedures:  HD  Antibiotics:  none   Objective: BP (!)  164/86 (BP Location: Right Arm)   Pulse 91   Temp 98.8 F (37.1 C) (Oral)   Resp 18   Wt 80.5 kg (177 lb 7.5 oz)   SpO2 99%   BMI 26.21 kg/m   Intake/Output Summary (Last 24 hours) at 09/20/2017 1614 Last data filed at 09/20/2017 0912 Gross per 24 hour  Intake 580 ml  Output 4000 ml  Net -3420 ml   Filed Weights   09/19/17 2040 09/20/17 0045  Weight: 84.5 kg (186 lb 4.6 oz) 80.5 kg (177 lb 7.5 oz)    Exam: Patient is examined daily including today on 09/20/2017, exams remain the same as of yesterday except that has changed    General:  NAD  Cardiovascular: RRR  Respiratory: diminished on the right  Abdomen: Soft/ND/NT, positive BS  Musculoskeletal: No Edema  Neuro: alert, oriented   Data Reviewed: Basic Metabolic Panel: Recent Labs  Lab 09/19/17 1135 09/20/17 0430  NA 138 137  K 5.5* 4.4  CL 98 97*  CO2 25 27  GLUCOSE 124* 142*  BUN 63* 45*  CREATININE 8.44* 7.00*  CALCIUM 7.6* 8.0*   Liver Function Tests: No results for input(s): AST, ALT, ALKPHOS, BILITOT, PROT, ALBUMIN in the last 168 hours. No results for input(s): LIPASE, AMYLASE in the last 168 hours. No results for input(s): AMMONIA in the last 168 hours. CBC: Recent Labs  Lab 09/19/17 1135 09/20/17 0430  WBC 7.6 7.3  HGB 9.2* 9.4*  HCT 31.4* 32.1*  MCV 79.5 78.3  PLT 333 328   Cardiac Enzymes:   Recent Labs  Lab 09/19/17 1135  TROPONINI 0.03*  BNP (last 3 results) Recent Labs    05/19/17 0446  BNP 2,037.0*    ProBNP (last 3 results) No results for input(s): PROBNP in the last 8760 hours.  CBG: Recent Labs  Lab 09/19/17 1708 09/20/17 0608  GLUCAP 104* 120*    Recent Results (from the past 240 hour(s))  MRSA PCR Screening     Status: None   Collection Time: 09/19/17  5:31 PM  Result Value Ref Range Status   MRSA by PCR NEGATIVE NEGATIVE Final    Comment:        The GeneXpert MRSA Assay (FDA approved for NASAL specimens only), is one component of a comprehensive  MRSA colonization surveillance program. It is not intended to diagnose MRSA infection nor to guide or monitor treatment for MRSA infections. Performed at [facility], [place] [address], [place], [place] [address]      Studies: X-ray Chest Pa And Lateral  Result Date: 09/20/2017 CLINICAL DATA:  Pulmonary edema. EXAM: CHEST - 2 VIEW COMPARISON:  09/19/2017 FINDINGS: Cardiomediastinal silhouette is enlarged. Mediastinal contours appear intact. No evidence of pneumothorax. There is persistent interstitial pulmonary edema. There is a moderate in size right pleural effusion with right lower lobe atelectasis. Osseous structures are without acute abnormality. Soft tissues are grossly normal. IMPRESSION: Enlarged cardiac silhouette with interstitial pulmonary edema. Moderate in size right pleural effusion with right lower lobe atelectasis. Electronically Signed   By: Fidela Salisbury M.D.   On: 09/20/2017 13:11    Scheduled Meds: . cinacalcet  30 mg Oral Q breakfast  . [START ON 09/22/2017] darbepoetin (ARANESP) injection - DIALYSIS  200 mcg Intravenous Q Tue-HD  . feeding supplement (NEPRO CARB STEADY)  237 mL Oral TID BM  . heparin  5,000 Units Subcutaneous Q8H  . multivitamin  1 tablet Oral Daily  . mycophenolate  360 mg Oral BID  . pravastatin  80 mg Oral QHS  . predniSONE  5 mg Oral Q breakfast  . sevelamer carbonate  800 mg Oral TID WC  . tacrolimus  5 mg Oral BID    Continuous Infusions:   Time spent: 55mins I have personally reviewed and interpreted on  09/20/2017 daily labs, tele strips, imagings as discussed above under date review session and assessment and plans.  I reviewed all nursing notes, pharmacy notes, consultant notes,  vitals, pertinent old records  I have discussed plan of care as described above with RN , patient  on 09/20/2017   Florencia Reasons MD, PhD  [facility] Pager [phone]. If 7PM-7AM, please contact night-coverage at [email or web address], password  [facility] 09/20/2017, 4:14 PM  LOS: 0 days

## 2017-09-20 NOTE — Progress Notes (Signed)
Pt completed HD trmt as ordered.  Mild bil feet cramps post trmt.  Pre trmt, required 2 attempts to cannulate arterial site.  Per patient, to avoid aneurysm at distal end of AVF, art needle is cannluated up.  2nd attempt successful.

## 2017-09-21 ENCOUNTER — Other Ambulatory Visit (HOSPITAL_COMMUNITY): Payer: Medicare Other

## 2017-09-21 DIAGNOSIS — D649 Anemia, unspecified: Secondary | ICD-10-CM | POA: Diagnosis not present

## 2017-09-21 DIAGNOSIS — T8612 Kidney transplant failure: Secondary | ICD-10-CM | POA: Diagnosis not present

## 2017-09-21 DIAGNOSIS — E876 Hypokalemia: Secondary | ICD-10-CM | POA: Diagnosis not present

## 2017-09-21 DIAGNOSIS — N186 End stage renal disease: Secondary | ICD-10-CM | POA: Diagnosis not present

## 2017-09-21 DIAGNOSIS — J810 Acute pulmonary edema: Secondary | ICD-10-CM

## 2017-09-21 DIAGNOSIS — Z992 Dependence on renal dialysis: Secondary | ICD-10-CM | POA: Diagnosis not present

## 2017-09-21 DIAGNOSIS — J9601 Acute respiratory failure with hypoxia: Secondary | ICD-10-CM

## 2017-09-21 DIAGNOSIS — N2581 Secondary hyperparathyroidism of renal origin: Secondary | ICD-10-CM | POA: Diagnosis not present

## 2017-09-21 LAB — RENAL FUNCTION PANEL
ALBUMIN: 2.8 g/dL — AB (ref 3.5–5.0)
Anion gap: 16 — ABNORMAL HIGH (ref 5–15)
BUN: 34 mg/dL — AB (ref 6–20)
CALCIUM: 8.3 mg/dL — AB (ref 8.9–10.3)
CO2: 25 mmol/L (ref 22–32)
CREATININE: 5.84 mg/dL — AB (ref 0.61–1.24)
Chloride: 96 mmol/L — ABNORMAL LOW (ref 98–111)
GFR calc Af Amer: 11 mL/min — ABNORMAL LOW (ref >60)
GFR, EST NON AFRICAN AMERICAN: 10 mL/min — AB (ref >60)
Glucose, Bld: 122 mg/dL — ABNORMAL HIGH (ref 70–99)
PHOSPHORUS: 4.2 mg/dL (ref 2.5–4.6)
Potassium: 4.2 mmol/L (ref 3.5–5.1)
Sodium: 137 mmol/L (ref 135–145)

## 2017-09-21 LAB — CBC
HCT: 31.6 % — ABNORMAL LOW (ref 39.0–52.0)
Hemoglobin: 9.5 g/dL — ABNORMAL LOW (ref 13.0–17.0)
MCH: 23.5 pg — ABNORMAL LOW (ref 26.0–34.0)
MCHC: 30.1 g/dL (ref 30.0–36.0)
MCV: 78 fL (ref 78.0–100.0)
PLATELETS: 315 10*3/uL (ref 150–400)
RBC: 4.05 MIL/uL — ABNORMAL LOW (ref 4.22–5.81)
RDW: 23.5 % — AB (ref 11.5–15.5)
WBC: 8.5 10*3/uL (ref 4.0–10.5)

## 2017-09-21 LAB — GLUCOSE, CAPILLARY
Glucose-Capillary: 110 mg/dL — ABNORMAL HIGH (ref 70–99)
Glucose-Capillary: 118 mg/dL — ABNORMAL HIGH (ref 70–99)

## 2017-09-21 MED ORDER — DARBEPOETIN ALFA 200 MCG/0.4ML IJ SOSY
200.0000 ug | PREFILLED_SYRINGE | INTRAMUSCULAR | Status: DC
Start: 2017-09-24 — End: 2017-09-25
  Administered 2017-09-24: 200 ug via INTRAVENOUS
  Filled 2017-09-21: qty 0.4

## 2017-09-21 MED ORDER — LABETALOL HCL 200 MG PO TABS
200.0000 mg | ORAL_TABLET | Freq: Two times a day (BID) | ORAL | Status: DC
  Administered 2017-09-21 – 2017-09-25 (×8): 200 mg via ORAL
  Filled 2017-09-21 (×8): qty 1

## 2017-09-21 MED ORDER — HYDRALAZINE HCL 20 MG/ML IJ SOLN
10.0000 mg | Freq: Once | INTRAMUSCULAR | Status: AC
Start: 2017-09-21 — End: 2017-09-21
  Administered 2017-09-21: 10 mg via INTRAVENOUS
  Filled 2017-09-21: qty 1

## 2017-09-21 MED ORDER — CLONIDINE HCL 0.1 MG PO TABS
0.1000 mg | ORAL_TABLET | Freq: Two times a day (BID) | ORAL | Status: DC
  Administered 2017-09-21 – 2017-09-25 (×8): 0.1 mg via ORAL
  Filled 2017-09-21 (×8): qty 1

## 2017-09-21 NOTE — Progress Notes (Signed)
PROGRESS NOTE  Ryan [name] IHK:[id number] DOB: 09/21/1960 DOA: 09/19/2017 PCP: Orma Flaming, MD  HPI/Recap of past 24 hours:  Feeling better, denies chest pain He is to have HD this evening  Assessment/Plan: Active Problems:   Hypertension   ESRD (end stage renal disease) ([place])   Anemia of chronic disease   Pleural effusion   Hyperlipidemia   Shortness of breath   Acute respiratory distress  Acute hypoxic respiratory failure in the setting of  pulmonary edema with volume overload due to missing dialysis today/ESRD on HD T, Th S . Nephrology input appreciated, patient received dialysis on 6/29 , 6/30 and today. 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,  back on dialysis  He remained to be on Prograf, Myfortic, and prednisone, he is followed by  transplant nephrology at [facility] He was recently evaluated by transplant nephrology at [facility] when he was hospitalized from 6/6-6/10  Anemia of chronic disease hgb stable around 9  H/o Bloody drainage from penis, chronic, intermittent  H/o right ureter stent in 2017 He is followed by [facility]  HTN:  He was recently evaluated by cardiology Dr Ellyn Hack who plan to continue adjust his blood pressure meds   Code Status: full  Family Communication: patient   Disposition Plan: need nephrology and cardiology clearance,    Consultants:  Nephrology  cardiology  Procedures:  HD  Antibiotics:  none   Objective: BP (!) 198/106   Pulse (!) 101   Temp 98.9 F (37.2  C) (Oral)   Resp 20   Wt 78 kg (171 lb 14.4 oz)   SpO2 95%   BMI 25.39 kg/m   Intake/Output Summary (Last 24 hours) at 09/21/2017 0822 Last data filed at 09/21/2017 0600 Gross per 24 hour  Intake 340 ml  Output 3485 ml  Net -3145 ml   Filed Weights   09/20/17 1430 09/20/17 1935 09/20/17 2041  Weight: 81.3 kg (179 lb 3.7 oz) 77.9 kg (171 lb 11.8 oz) 78 kg (171 lb 14.4 oz)    Exam: Patient is examined daily including today on 09/21/2017, exams remain the same as of yesterday except that has changed    General:  NAD  Cardiovascular: RRR  Respiratory: diminished on the right  Abdomen: Soft/ND/NT, positive BS  Musculoskeletal: trace pedal Edema  Neuro: alert, oriented   Data Reviewed: Basic Metabolic Panel: Recent Labs  Lab 09/19/17 1135 09/20/17 0430  NA 138 137  K 5.5* 4.4  CL 98 97*  CO2 25 27  GLUCOSE 124* 142*  BUN 63* 45*  CREATININE 8.44* 7.00*  CALCIUM 7.6* 8.0*   Liver Function Tests: No results for input(s): AST, ALT, ALKPHOS, BILITOT, PROT, ALBUMIN in the last 168 hours. No results for input(s): LIPASE, AMYLASE in the last 168 hours. No results for input(s): AMMONIA in the last 168 hours. CBC: Recent Labs  Lab 09/19/17 1135 09/20/17 0430  WBC 7.6 7.3  HGB 9.2* 9.4*  HCT 31.4* 32.1*  MCV 79.5 78.3  PLT 333 328   Cardiac Enzymes:   Recent Labs  Lab 09/19/17 1135  TROPONINI 0.03*   BNP (  last 3 results) Recent Labs    05/19/17 0446  BNP 2,037.0*    ProBNP (last 3 results) No results for input(s): PROBNP in the last 8760 hours.  CBG: Recent Labs  Lab 09/19/17 1708 09/20/17 0608 09/21/17 0503  GLUCAP 104* 120* 110*    Recent Results (from the past 240 hour(s))  MRSA PCR Screening     Status: None   Collection Time: 09/19/17  5:31 PM  Result Value Ref Range Status   MRSA by PCR NEGATIVE NEGATIVE Final    Comment:        The GeneXpert MRSA Assay (FDA approved for NASAL specimens only), is one component of a comprehensive MRSA  colonization surveillance program. It is not intended to diagnose MRSA infection nor to guide or monitor treatment for MRSA infections. Performed at [facility], [place] [address], [place], [place] [address]      Studies: X-ray Chest Pa And Lateral  Result Date: 09/20/2017 CLINICAL DATA:  Pulmonary edema. EXAM: CHEST - 2 VIEW COMPARISON:  09/19/2017 FINDINGS: Cardiomediastinal silhouette is enlarged. Mediastinal contours appear intact. No evidence of pneumothorax. There is persistent interstitial pulmonary edema. There is a moderate in size right pleural effusion with right lower lobe atelectasis. Osseous structures are without acute abnormality. Soft tissues are grossly normal. IMPRESSION: Enlarged cardiac silhouette with interstitial pulmonary edema. Moderate in size right pleural effusion with right lower lobe atelectasis. Electronically Signed   By: Fidela Salisbury M.D.   On: 09/20/2017 13:11    Scheduled Meds: . cinacalcet  30 mg Oral Q breakfast  . [START ON 09/22/2017] darbepoetin (ARANESP) injection - DIALYSIS  200 mcg Intravenous Q Tue-HD  . feeding supplement (NEPRO CARB STEADY)  237 mL Oral TID BM  . heparin  5,000 Units Subcutaneous Q8H  . hydrALAZINE  10 mg Intravenous Once  . multivitamin  1 tablet Oral Daily  . mycophenolate  360 mg Oral BID  . pravastatin  80 mg Oral QHS  . predniSONE  5 mg Oral Q breakfast  . sevelamer carbonate  800 mg Oral TID WC  . tacrolimus  5 mg Oral BID    Continuous Infusions:   Time spent: 34mins, case discussed with nephrology and cardiology I have personally reviewed and interpreted on  09/21/2017 daily labs, tele strips, imagings as discussed above under date review session and assessment and plans.  I reviewed all nursing notes, pharmacy notes, consultant notes,  vitals, pertinent old records  I have discussed plan of care as described above with RN , patient  on 09/21/2017   Florencia Reasons MD, PhD  [facility] Pager  [phone]. If 7PM-7AM, please contact night-coverage at [email or web address], password [facility] 09/21/2017, 8:22 AM  LOS: 1 day

## 2017-09-21 NOTE — Procedures (Signed)
Patient seen on Hemodialysis. QB 400, UF goal 4L Treatment adjusted as needed.  Ryan Shiley MD [facility]. Office # [phone] Pager # [phone] 3:50 PM

## 2017-09-21 NOTE — Progress Notes (Signed)
Pt going back to dialysis after site infiltration.

## 2017-09-21 NOTE — Progress Notes (Signed)
Pt gone to dialysis via bed.

## 2017-09-21 NOTE — Progress Notes (Signed)
Patients BP 194/122. RN administered PRN Hydralazine 10mg .  RN rechecked BP: 198/106.  RN notified on call Hospitalist NP, Baltazar Najjar.  Awaiting orders or call back.  Ermalinda Memos, RN

## 2017-09-21 NOTE — Consult Note (Addendum)
Cardiology Consultation:   Patient ID: [name]; [id number]; [birth year]   Admit date: 09/19/2017 Date of Consult: 09/21/2017  Primary Care Provider: Orma Flaming, MD Primary Cardiologist: Dr. Ellyn Hack   Patient Profile:   Ryan [name] is a 57 y.o. male with a hx of  ESRD (T,TH, Sat) s/p failed renal transplant (2010), HTN, anemia of chronic disease and hyperlipidemia who is being seen today for the for echocardiogram and myocardial perfusion study at the request of Dr. Erlinda Hong.  History of Present Illness:   Ryan [name] is a 57yo M with a hx as stated above who presented to [facility] on 09/19/17 with fluid volume overload, shortness of breath, and progressive cough for the last several months. Patient reports that over the course of several months, he has been noticing worsening SOB with exertion and ongoing cough for which he has been treated with prednisone and anbx by his PCP without resolution. He states that on Saturday, he reported to HD as usual and was trying to pack his car to move thereafter and could not catch his breath. Given his ongoing and progressive symptoms, he called EMS for transport to the emergency department for further evaluation. He denies chest pain, palpitations, dizziness or syncope. He does report exertional shortness of breath, orthopnea and LW swelling. He denies tobacco, alcohol or recreational drug use. He reports a strong hx of CAD in his fathers side of the family.   In the ED, CXR revealed cardiomegaly and mild interstitial opacities suggesting edema.  An EKG was performed which showed NSR with mild T wave inversions in anterior leads, consistent with tracing from 05/19/2017 however changed from tracings from 08/25/2016.  Nephrology was consulted with plans for dialysis.  Of note, he was recently seen by his PCP on 07/28/17 with recurrent cough in which CXR was completed showing cardiomegaly with vascular congestion but no true pulmonary edema and was  referred to cardiology. He was seen by Dr. Ellyn Hack on 09/14/2017 with concerns for possible heart failure.  At that time, he had been noticing a persistent cough which intermittently has been associated with some pinkish sputum as well as a history of PND, orthopnea and exertional dyspnea which is new for him. Given the above symptoms, an outpatient echocardiogram, as well as a myocardial perfusion study was ordered and to be performed today, 09/21/17.  Cardiology has been consulted given these recent symptoms and the need for further ischemic work-up with echocardiogram and possible myocardial perfusion study. .  Past Medical History:  Diagnosis Date  . Allergy   . Blood transfusion without reported diagnosis   . Dialysis patient ([place])    Tues,thurs,sat  . ESRD (end stage renal disease) ([place])   . Hyperlipidemia   . Hypertension     Past Surgical History:  Procedure Laterality Date  . A/V FISTULAGRAM Left 12/15/2016   Procedure: A/V Fistulagram - left;  Surgeon: Angelia Mould, MD;  Location: [place] [facility] [facility];  Service: Cardiovascular;  Laterality: Left;  . AV FISTULA PLACEMENT Left 08/28/2016   Procedure: LEFT UPPER  ARM ARTERIOVENOUS (AV) FISTULA CREATION;  Surgeon: Angelia Mould, MD;  Location: [facility] OR;  Service: Vascular;  Laterality: Left;  . IR THORACENTESIS ASP PLEURAL SPACE W/IMG GUIDE  08/22/2017   1.2 L -right-sided  . stent in kidneys     dec 2017     Prior to Admission medications   Medication Sig Start Date End Date Taking? Authorizing Provider  cinacalcet (SENSIPAR) 30 MG tablet  Take 30 mg by mouth daily.   Yes [provider]  cloNIDine (CATAPRES) 0.2 MG tablet Take 0.1 mg by mouth 2 (two) times daily. 12/01/16  Yes [provider]  furosemide (LASIX) 80 MG tablet Take 80 mg by mouth 2 (two) times daily.   Yes [provider]  guaiFENesin (MUCINEX) 600 MG 12 hr tablet Take 600 mg by mouth 2 (two) times daily as needed for to loosen  phlegm.    Yes [provider]  labetalol (NORMODYNE) 200 MG tablet Take 200 mg by mouth 2 (two) times daily.    Yes [provider]  loratadine (CLARITIN) 10 MG tablet Take 10 mg by mouth daily as needed for allergies.   Yes [provider]  losartan (COZAAR) 25 MG tablet TAKE 1 TABLET BY MOUTH EVERY DAY 09/10/17  Yes Orma Flaming, MD  multivitamin (RENA-VIT) TABS tablet Take 1 tablet by mouth daily.   Yes [provider]  mycophenolate (MYFORTIC) 360 MG TBEC EC tablet Take 360 mg by mouth 2 (two) times daily.   Yes [provider]  NIFEdipine (PROCARDIA XL/ADALAT-CC) 60 MG 24 hr tablet Take 60 mg by mouth 2 (two) times daily.   Yes [provider]  Nutritional Supplements (FEEDING SUPPLEMENT, NEPRO CARB STEADY,) LIQD Take 237 mLs by mouth 3 (three) times daily between meals. Patient taking differently: Take 237 mLs by mouth See admin instructions. Take 237 mls by mouth on Tuesday, Thursday, Saturday at dialysis 08/28/16  Yes Lavina Hamman, MD  pravastatin (PRAVACHOL) 80 MG tablet TAKE 1 TABLET BY MOUTH EVERY DAY Patient taking differently: TAKE 1 TABLET BY MOUTH EVERY DAY AT BEDTIME 09/08/17  Yes Inda Coke, PA  predniSONE (DELTASONE) 5 MG tablet Take 5 mg by mouth daily with breakfast.   Yes [provider]  sevelamer (RENAGEL) 800 MG tablet Take 800-2,400 mg by mouth See admin instructions. Take 3 tablets (2400 mg) by mouth with meals and 1 tablet (800 mg) with snacks   Yes [provider]  tacrolimus (PROGRAF) 5 MG capsule Take 5 mg by mouth 2 (two) times daily.   Yes [provider]  benzonatate (TESSALON) 100 MG capsule Take 1 capsule (100 mg total) by mouth every 8 (eight) hours. Patient not taking: Reported on 09/19/2017 08/22/17   Lacretia Leigh, MD    Inpatient Medications: Scheduled Meds: . cinacalcet  30 mg Oral Q breakfast  . [START ON 09/24/2017] darbepoetin (ARANESP) injection - DIALYSIS  200 mcg  Intravenous Q Thu-HD  . feeding supplement (NEPRO CARB STEADY)  237 mL Oral TID BM  . heparin  5,000 Units Subcutaneous Q8H  . multivitamin  1 tablet Oral Daily  . mycophenolate  360 mg Oral BID  . pravastatin  80 mg Oral QHS  . predniSONE  5 mg Oral Q breakfast  . sevelamer carbonate  800 mg Oral TID WC  . tacrolimus  5 mg Oral BID   Continuous Infusions:  PRN Meds: acetaminophen **OR** acetaminophen, albuterol, bisacodyl, guaiFENesin, hydrALAZINE, HYDROcodone-acetaminophen, ondansetron **OR** ondansetron (ZOFRAN) IV, senna-docusate  Allergies:    Allergies  Allergen Reactions  . Azithromycin Nausea And Vomiting and Other (See Comments)    Chest tightness    Social History:   Social History   Socioeconomic History  . Marital status: Divorced    Spouse name: Not on file  . Number of children: Not on file  . Years of education: Not on file  . Highest education level: Not on file  Occupational  History  . Not on file  Social Needs  . Financial resource strain: Not on file  . Food insecurity:    Worry: Not on file    Inability: Not on file  . Transportation needs:    Medical: Not on file    Non-medical: Not on file  Tobacco Use  . Smoking status: Never Smoker  . Smokeless tobacco: Never Used  Substance and Sexual Activity  . Alcohol use: No  . Drug use: No  . Sexual activity: Not on file  Lifestyle  . Physical activity:    Days per week: Not on file    Minutes per session: Not on file  . Stress: Not on file  Relationships  . Social connections:    Talks on phone: Not on file    Gets together: Not on file    Attends religious service: Not on file    Active member of club or organization: Not on file    Attends meetings of clubs or organizations: Not on file    Relationship status: Not on file  . Intimate partner violence:    Fear of current or ex partner: Not on file    Emotionally abused: Not on file    Physically abused: Not on file    Forced sexual  activity: Not on file  Other Topics Concern  . Not on file  Social History Narrative   Divorced   Does Chiropodist work   3 years of college.   Does not drink, does not smoke.  Does not use illicit drugs.   Son in nursing school at [employer]    Family History:   Family History  Problem Relation Age of Onset  . Hypertension Mother   . Heart disease Mother 63       By his report, he thinks that she had heart attack.  . Stroke Mother   . Cancer Father   . Kidney cancer Father   . Hypertension Sister   . Heart disease Maternal Grandmother   . Alcohol abuse Maternal Grandfather   . Mental illness Paternal Grandmother   . Learning disabilities Paternal Grandmother        Alzheimer's   . Stroke Paternal Grandfather   . Colon cancer Neg Hx   . Colon polyps Neg Hx   . Esophageal cancer Neg Hx   . Rectal cancer Neg Hx   . Stomach cancer Neg Hx    Family Status:  Family Status  Relation Name Status  . Mother  Deceased  . Father  Deceased  . Sister  Alive  . Daughter  Alive  . Son  Alive  . MGM  Deceased  . MGF  Deceased  . PGM  Deceased  . PGF  Deceased  . Sister  Alive  . Neg Hx  (Not Specified)    ROS:  Please see the history of present illness.  All other ROS reviewed and negative.     Physical Exam/Data:   Vitals:   09/21/17 0825 09/21/17 0849 09/21/17 1025 09/21/17 1425  BP: (!) 201/124 (!) 198/101 (!) 181/92 (!) 148/79  Pulse: (!) 102 (!) 103 (!) 102 82  Resp:  20    Temp:  97.8 F (36.6 C) 97.9 F (36.6 C)   TempSrc:  Oral Oral   SpO2:  96% 98%   Weight:        Intake/Output Summary (Last 24 hours) at 09/21/2017 1526 Last data filed at 09/21/2017 0849 Gross per 24 hour  Intake 120 ml  Output 3293 ml  Net -3173 ml   Filed Weights   09/20/17 1935 09/20/17 2041 09/21/17 0800  Weight: 171 lb 11.8 oz (77.9 kg) 171 lb 14.4 oz (78 kg) 172 lb 13.5 oz (78.4 kg)   Body mass index is 25.52 kg/m.   General: Well developed, well nourished, NAD Skin:  Warm, dry, intact  Head: Normocephalic, atraumatic, clear, moist mucus membranes. Neck: Negative for carotid bruits. No JVD Lungs:Clear to ausculation bilaterally. No wheezes, rales, or rhonchi. Breathing is unlabored. Cardiovascular: RRR with S1 S2. No murmurs, rubs or gallops Abdomen: Soft, non-tender, non-distended with normoactive bowel sounds. No obvious abdominal masses. MSK: Strength and tone appear normal for age. 5/5 in all extremities Extremities: Mild LE 1+  edema. No clubbing or cyanosis. DP/PT pulses 2+ bilaterally Neuro: Alert and oriented. No focal deficits. No facial asymmetry. MAE spontaneously. Psych: Responds to questions appropriately with normal affect.     EKG:  The EKG was personally reviewed and demonstrates: 09/20/2017 NSR with mild T wave inversions in anterior leads, consistent with prior tracing Telemetry:  Telemetry was personally reviewed and demonstrates:  09/21/17 NSR  Relevant CV Studies:  ECHO: Outpatient echocardiogram, plan to do this inpatient  CATH: None  Laboratory Data:  Chemistry Recent Labs  Lab 09/19/17 1135 09/20/17 0430 09/21/17 0800  NA 138 137 137  K 5.5* 4.4 4.2  CL 98 97* 96*  CO2 25 27 25   GLUCOSE 124* 142* 122*  BUN 63* 45* 34*  CREATININE 8.44* 7.00* 5.84*  CALCIUM 7.6* 8.0* 8.3*  GFRNONAA 6* 8* 10*  GFRAA 7* 9* 11*  ANIONGAP 15 13 16*    Total Protein  Date Value Ref Range Status  07/12/2017 6.8 6.5 - 8.1 g/dL Final   Albumin  Date Value Ref Range Status  09/21/2017 2.8 (L) 3.5 - 5.0 g/dL Final   AST  Date Value Ref Range Status  07/12/2017 19 15 - 41 U/L Final   ALT  Date Value Ref Range Status  07/12/2017 21 17 - 63 U/L Final   Alkaline Phosphatase  Date Value Ref Range Status  07/12/2017 86 38 - 126 U/L Final   Total Bilirubin  Date Value Ref Range Status  07/12/2017 0.4 0.3 - 1.2 mg/dL Final   Hematology Recent Labs  Lab 09/19/17 1135 09/20/17 0430 09/21/17 0800  WBC 7.6 7.3 8.5  RBC 3.95*  4.10* 4.05*  HGB 9.2* 9.4* 9.5*  HCT 31.4* 32.1* 31.6*  MCV 79.5 78.3 78.0  MCH 23.3* 22.9* 23.5*  MCHC 29.3* 29.3* 30.1  RDW 23.4* 23.6* 23.5*  PLT 333 328 315   Cardiac Enzymes Recent Labs  Lab 09/19/17 1135  TROPONINI 0.03*   No results for input(s): TROPIPOC in the last 168 hours.  BNPNo results for input(s): BNP, PROBNP in the last 168 hours.  DDimer No results for input(s): DDIMER in the last 168 hours. TSH: No results found for: TSH Lipids: Lab Results  Component Value Date   CHOL 162 06/10/2017   HDL 56.00 06/10/2017   LDLCALC 83 06/10/2017   TRIG 117.0 06/10/2017   CHOLHDL 3 06/10/2017   HgbA1c:No results found for: HGBA1C  Radiology/Studies:  X-ray Chest Pa And Lateral  Result Date: 09/20/2017 CLINICAL DATA:  Pulmonary edema. EXAM: CHEST - 2 VIEW COMPARISON:  09/19/2017 FINDINGS: Cardiomediastinal silhouette is enlarged. Mediastinal contours appear intact. No evidence of pneumothorax. There is persistent interstitial pulmonary edema. There is a moderate in size right pleural effusion with right lower lobe  atelectasis. Osseous structures are without acute abnormality. Soft tissues are grossly normal. IMPRESSION: Enlarged cardiac silhouette with interstitial pulmonary edema. Moderate in size right pleural effusion with right lower lobe atelectasis. Electronically Signed   By: Fidela Salisbury M.D.   On: 09/20/2017 13:11   Dg Chest Port 1 View  Result Date: 09/19/2017 CLINICAL DATA:  Chest pain EXAM: PORTABLE CHEST 1 VIEW COMPARISON:  August 22, 2017 FINDINGS: No pneumothorax. Diffuse interstitial opacities. Focal infiltrate in the right base. Possible small right effusion. The cardiomediastinal silhouette is stable. No other acute abnormalities. IMPRESSION: 1. Cardiomegaly and mild interstitial opacities suggesting edema. 2. Focal opacity in the right base may represent a superimposed pneumonia versus a right pleural effusion with underlying atelectasis. Recommend clinical  correlation and follow-up to resolution. Electronically Signed   By: Dorise Bullion III M.D   On: 09/19/2017 12:09   Assessment and Plan:   1.  Acute hypoxic respiratory failure in the setting of pulmonary edema secondary to missed dialysis: -Patient presented to [facility] on 09/19/2017 with acute onset of shortness of breath and exertional dyspnea however, it appears that this has been progressively worsening for the last several months. He states he has been compliant with HD and all medications -Patient was initially seen by Dr. Ellyn Hack on 09/14/2017 for similar complaints in which an echocardiogram and outpatient myocardial perfusion study was ordered today 09/21/2017 -CXR persistent interstitial pulmonary edema and moderate-sized right pleural effusion with right lower lobe atelectasis -Fluid volume managed by nephrology, HD. Pt has undergone 3 days of HD for symptom control.  -Reports feeling improved after dialysis -Will order echocardiogram  -Consider myocardial perfusion to be completed prior to HD tomorrow  2.  History of chest pain, exertional dyspnea: -Pt denies chest pain this admission however, was recently seen by Dr. Ellyn Hack as a referral from his PCP for exertional dyspnea. Pt is currently comfortable, undergoing HD  -He was scheduled for an outpatient echocardiogram and myocardial perfusion study which was to be completed 09/21/2017 -Troponin, 0.03 -EKG, unremarkable with no acute ischemic changes, there is evidence of mild T wave inversions in anterior leads -Will restart statin, labetalol -Will obtain echocardiogram today  -Will discuss need for Lexiscan Myoview perfusion study while inpatient with MD  3.  Hypertension: -Elevated, 148/79, 181/92, 198/101 -Patient is on home labetalol 200 twice daily, losartan 25 daily, clonidine 0.2 mg daily -Will restart home medications given sustained elevation in BP, likely in the setting of rebound HTN secondary clonidine   4.  End-stage  renal disease status post failed renal transplant: -Creatinine, 5.84 today  -Dialysis T, TH, Sat -Fluid volume management per nephrology  5.  Hyperlipidemia: -Last lipid panel 06/10/2017, CHO- 162, HDL-56, LDL-83,Trigs-117 -Continue statin   For questions or updates, please contact [place] Please consult [email or web address] for contact info under Cardiology/STEMI.   SignedKathyrn Drown NP-C [facility] Pager: [phone] 09/21/2017 3:26 PM

## 2017-09-21 NOTE — Progress Notes (Signed)
Patient ID: Ryan [name], male   DOB: 11/28/1960, 57 y.o.   MRN: [id number]  [facility] Progress Note   Assessment/ Plan:   1.  Volume overload/respiratory distress/pulmonary edema: Underwent emergent hemodialysis for 2 consecutive days and still having some shortness of breath.  Attempted hemodialysis today but unsuccessful due to infiltration of venous needle-currently resting arm and will reattempt later in the day.  Discussed with the patient the importance of sodium restriction and limiting his interdialytic weight gain. 2. ESRD: Usually on a Tuesday/Thursday/Saturday schedule with extra hemodialysis done yesterday and planned for today.  Will get his usual outpatient schedule resumed tomorrow. 3. Anemia: Continue ESA with current hemoglobin level being low, no overt loss.  Likely confounded by volume overload. Continue renal diet/renal multivitamin: Continue phosphorus binders and vitamin D receptor analog/Sensipar 5. Nutrition: Continue renal multivitamin/renal diet. 6. Hypertension: Blood pressure elevated, monitor with ultrafiltration at hemodialysis.  Subjective:   Reports some continued shortness of breath, unsuccessful getting dialysis earlier today due to infiltration of venous needle.   Objective:   BP (!) 181/92   Pulse (!) 102   Temp 97.9 F (36.6 C) (Oral)   Resp 20   Wt 78.4 kg (172 lb 13.5 oz)   SpO2 98%   BMI 25.52 kg/m   Physical Exam: Gen: Resting comfortably in bed, laying on left side CVS: Pulse regular tachycardia, S1 and S2 normal Resp: Fine rales both bases, intermittent expiratory rhonchi Abd: Soft, flat, nontender Ext: 2-3+ lower extremity edema, left upper arm AV fistula aneurysmal/pulsatile  Labs: BMET Recent Labs  Lab 09/19/17 1135 09/20/17 0430 09/21/17 0800  NA 138 137 137  K 5.5* 4.4 4.2  CL 98 97* 96*  CO2 25 27 25   GLUCOSE 124* 142* 122*  BUN 63* 45* 34*  CREATININE 8.44* 7.00* 5.84*  CALCIUM 7.6* 8.0* 8.3*   PHOS  --   --  4.2   CBC Recent Labs  Lab 09/19/17 1135 09/20/17 0430 09/21/17 0800  WBC 7.6 7.3 8.5  HGB 9.2* 9.4* 9.5*  HCT 31.4* 32.1* 31.6*  MCV 79.5 78.3 78.0  PLT 333 328 315   Medications:    . cinacalcet  30 mg Oral Q breakfast  . [START ON 09/24/2017] darbepoetin (ARANESP) injection - DIALYSIS  200 mcg Intravenous Q Thu-HD  . feeding supplement (NEPRO CARB STEADY)  237 mL Oral TID BM  . heparin  5,000 Units Subcutaneous Q8H  . multivitamin  1 tablet Oral Daily  . mycophenolate  360 mg Oral BID  . pravastatin  80 mg Oral QHS  . predniSONE  5 mg Oral Q breakfast  . sevelamer carbonate  800 mg Oral TID WC  . tacrolimus  5 mg Oral BID   Elmarie Shiley, MD 09/21/2017, 11:34 AM

## 2017-09-21 NOTE — Progress Notes (Signed)
Dialysis called site infiltrated.  Will pack with ice and come back to complete dialysis this afternoon.

## 2017-09-22 ENCOUNTER — Inpatient Hospital Stay (HOSPITAL_COMMUNITY): Payer: Medicare Other

## 2017-09-22 ENCOUNTER — Other Ambulatory Visit (HOSPITAL_COMMUNITY): Payer: Medicare Other

## 2017-09-22 DIAGNOSIS — T82898A Other specified complication of vascular prosthetic devices, implants and grafts, initial encounter: Secondary | ICD-10-CM

## 2017-09-22 DIAGNOSIS — N186 End stage renal disease: Secondary | ICD-10-CM

## 2017-09-22 DIAGNOSIS — R0602 Shortness of breath: Secondary | ICD-10-CM

## 2017-09-22 DIAGNOSIS — I351 Nonrheumatic aortic (valve) insufficiency: Secondary | ICD-10-CM

## 2017-09-22 DIAGNOSIS — Z992 Dependence on renal dialysis: Secondary | ICD-10-CM

## 2017-09-22 DIAGNOSIS — I150 Renovascular hypertension: Secondary | ICD-10-CM

## 2017-09-22 HISTORY — PX: TRANSTHORACIC ECHOCARDIOGRAM: SHX275

## 2017-09-22 LAB — RENAL FUNCTION PANEL
ANION GAP: 13 (ref 5–15)
Albumin: 2.6 g/dL — ABNORMAL LOW (ref 3.5–5.0)
BUN: 26 mg/dL — ABNORMAL HIGH (ref 6–20)
CO2: 28 mmol/L (ref 22–32)
Calcium: 7.6 mg/dL — ABNORMAL LOW (ref 8.9–10.3)
Chloride: 96 mmol/L — ABNORMAL LOW (ref 98–111)
Creatinine, Ser: 4.63 mg/dL — ABNORMAL HIGH (ref 0.61–1.24)
GFR calc non Af Amer: 13 mL/min — ABNORMAL LOW (ref >60)
GFR, EST AFRICAN AMERICAN: 15 mL/min — AB (ref >60)
GLUCOSE: 104 mg/dL — AB (ref 70–99)
Phosphorus: 4.2 mg/dL (ref 2.5–4.6)
Potassium: 4.9 mmol/L (ref 3.5–5.1)
SODIUM: 137 mmol/L (ref 135–145)

## 2017-09-22 LAB — ECHOCARDIOGRAM COMPLETE
HEIGHTINCHES: 69 in
Weight: 2539.7 oz

## 2017-09-22 LAB — GLUCOSE, CAPILLARY
Glucose-Capillary: 93 mg/dL (ref 70–99)
Glucose-Capillary: 122 mg/dL — ABNORMAL HIGH (ref 70–99)

## 2017-09-22 MED ORDER — CEFAZOLIN SODIUM-DEXTROSE 1-4 GM/50ML-% IV SOLN
1.0000 g | INTRAVENOUS | Status: AC
  Filled 2017-09-22: qty 50

## 2017-09-22 MED ORDER — LOSARTAN POTASSIUM 25 MG PO TABS
25.0000 mg | ORAL_TABLET | Freq: Every day | ORAL | Status: DC
  Administered 2017-09-22 – 2017-09-25 (×4): 25 mg via ORAL
  Filled 2017-09-22 (×5): qty 1

## 2017-09-22 NOTE — Procedures (Signed)
Patient seen on Hemodialysis. QB 300, UF goal 3L Treatment adjusted as needed.  Elmarie Shiley MD [facility] Office # [phone] Pager # [phone] 9:18 AM

## 2017-09-22 NOTE — Progress Notes (Signed)
  Echocardiogram 2D Echocardiogram has been performed.  Madelaine Etienne 09/22/2017, 3:11 PM

## 2017-09-22 NOTE — Progress Notes (Signed)
Patient ID: Ryan [name], male   DOB: 07/14/1960, 56 y.o.   MRN: [id number]  [facility] Progress Note   Assessment/ Plan:   1.  Volume overload/respiratory distress/pulmonary edema: With consecutive hemodialysis for the past 3 days and significant volume unloading with good clinical/symptomatic improvement of pulmonary edema.  We discussed the importance of continued stringently with restriction of interdialytic weight gain and sodium intake. 2. ESRD: Usually TTS schedule-now back on his usual outpatient schedule with improved volume status. 3. Anemia: Hemoglobin/hematocrit remain low, continue ESA Continue renal diet/renal multivitamin: Continue on current phosphorus binders and vitamin D receptor analog/Sensipar 5. Nutrition: Continue renal multivitamin/renal diet. 6. Hypertension: Blood pressure elevated, monitor with ultrafiltration at hemodialysis.  Subjective:   Reports improvement of shortness of breath, continues to have problems with cannulation of his fistula/access pressures.   Objective:   BP (!) 172/96   Pulse 86   Temp 98.9 F (37.2 C) (Oral)   Resp (!) 22   Ht 5\' 9"  (1.753 m)   Wt 75.1 kg (165 lb 9.1 oz)   SpO2 93%   BMI 24.45 kg/m   Physical Exam: Gen: Resting comfortably in hemodialysis CVS: Pulse regular rhythm with normal rate, S1 and S2 normal Resp: Anteriorly clear to auscultation bilaterally, no rales or rhonchi Abd: Soft, flat, nontender Ext: 1+ lower extremity edema, left upper arm AV fistula aneurysmal/pulsatile  Labs: BMET Recent Labs  Lab 09/19/17 1135 09/20/17 0430 09/21/17 0800 09/22/17 0838  NA 138 137 137 137  K 5.5* 4.4 4.2 4.9  CL 98 97* 96* 96*  CO2 25 27 25 28   GLUCOSE 124* 142* 122* 104*  BUN 63* 45* 34* 26*  CREATININE 8.44* 7.00* 5.84* 4.63*  CALCIUM 7.6* 8.0* 8.3* 7.6*  PHOS  --   --  4.2 4.2   CBC Recent Labs  Lab 09/19/17 1135 09/20/17 0430 09/21/17 0800  WBC 7.6 7.3 8.5  HGB 9.2* 9.4* 9.5*  HCT  31.4* 32.1* 31.6*  MCV 79.5 78.3 78.0  PLT 333 328 315   Medications:    . cinacalcet  30 mg Oral Q breakfast  . cloNIDine  0.1 mg Oral BID  . [START ON 09/24/2017] darbepoetin (ARANESP) injection - DIALYSIS  200 mcg Intravenous Q Thu-HD  . feeding supplement (NEPRO CARB STEADY)  237 mL Oral TID BM  . heparin  5,000 Units Subcutaneous Q8H  . labetalol  200 mg Oral BID  . multivitamin  1 tablet Oral Daily  . mycophenolate  360 mg Oral BID  . pravastatin  80 mg Oral QHS  . predniSONE  5 mg Oral Q breakfast  . sevelamer carbonate  800 mg Oral TID WC  . tacrolimus  5 mg Oral BID   Elmarie Shiley, MD 09/22/2017, 9:22 AM

## 2017-09-22 NOTE — Consult Note (Addendum)
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  I have recommended plication of this and this has been scheduled for tomorrow with Dr. Curt Jews.  The patient had complained of some intermittent problems with the fistula functioning however he has had 2 recent fistulogram's most recently on 09/16/2017.  I discussed the studies with Dr. Otelia Santee.  Most recently, the patient had venoplasty of an outflow stenosis and currently the fistula has a good thrill suggesting there is no significant recurrent stenosis.  He also commented however on hematoma adjacent to the fistula at the arterial end.  I do not see evidence of this on exam.  I have ordered a duplex to further evaluate this.  However I think there should be room to cannulate the fistula above and below the plication which she is scheduled for tomorrow.  [name], MD, FACS [facility] [phone] Office: [phone]  Reason for Consult: AV fistula pseudoaneurysm  Referring Physician: Dr. Posey Pronto  History of Present Illness: 57 y/o male with left UE brachiocephalic AV fistula.  We are being consulted for pseudoaneurysm development.    He has had issue with the fistula in the past.  He was seen by Dr. Scot Dock 11/12/2017 when a duplex showed increased velocity within the midportion of the fistula which was thought to be due to a valve.  He underwent a fistulogram 12/15/2017.  There were no abnormal findings.  The fistula was widely patent with mild narrowing.  No intervention was performed.     He was seen again on a follow up visit 01/14/2018 the fistula was used for HD 3 times prior to an infiltration occurred 01/13/2017.  At that time we recommended rest for 3 weeks and re use by 02/10/2018.  The patient states he has been seen by Dr. Augustin Coupe 2 times for intervention fistulograms since Jan 2019.  He also  states he has developed a pseudoaneurysm since Jan.  He denise thinning skin areas, ulcers, and no prolonged bleeding episodes.  He also denise weakness, pain or loss of sensation in the left UE.  He is currently on HD without problems today.  Past medical history: failed renal transplant, HTN,  Hyperlipidemia, and ESRD.    Current Facility-Administered Medications  Medication Dose Route Frequency Provider Last Rate Last Dose  . acetaminophen (TYLENOL) tablet 650 mg  650 mg Oral Q6H PRN Rondel Jumbo, PA-C       Or  . acetaminophen (TYLENOL) suppository 650 mg  650 mg Rectal Q6H PRN Rondel Jumbo, PA-C      . albuterol (PROVENTIL) (2.5 MG/3ML) 0.083% nebulizer solution 2.5 mg  2.5 mg Nebulization Q4H PRN Rondel Jumbo, PA-C      . bisacodyl (DULCOLAX) suppository 10 mg  10 mg Rectal Daily PRN Rondel Jumbo, PA-C      . cinacalcet (SENSIPAR) tablet 30 mg  30 mg Oral Q breakfast Sharene Butters E, PA-C   30 mg at 09/21/17 1142  . cloNIDine (CATAPRES) tablet 0.1 mg  0.1 mg Oral BID Kathyrn Drown D, NP   0.1 mg at 09/21/17 2217  . [START ON 09/24/2017] Darbepoetin Alfa (ARANESP) injection 200 mcg  200 mcg Intravenous Q Thu-HD Roney Jaffe, MD      . feeding supplement (NEPRO CARB STEADY) liquid 237 mL  237 mL Oral TID BM Rondel Jumbo, PA-C   237  mL at 09/21/17 2218  . guaiFENesin (MUCINEX) 12 hr tablet 1,200 mg  1,200 mg Oral BID PRN Rondel Jumbo, PA-C   1,200 mg at 09/20/17 2116  . heparin injection 5,000 Units  5,000 Units Subcutaneous Q8H Wertman, Sara E, PA-C      . hydrALAZINE (APRESOLINE) injection 10 mg  10 mg Intravenous Q6H PRN Bodenheimer, Charles A, NP   10 mg at 09/21/17 0517  . HYDROcodone-acetaminophen (NORCO/VICODIN) 5-325 MG per tablet 1-2 tablet  1-2 tablet Oral Q4H PRN Rondel Jumbo, PA-C   2 tablet at 09/20/17 0913  . labetalol (NORMODYNE) tablet 200 mg  200 mg Oral BID Kathyrn Drown D, NP   200 mg at 09/21/17 2217  . losartan (COZAAR) tablet 25 mg  25 mg Oral Daily  Kathyrn Drown D, NP      . multivitamin (RENA-VIT) tablet 1 tablet  1 tablet Oral Daily Rondel Jumbo, PA-C   1 tablet at 09/21/17 1142  . mycophenolate (MYFORTIC) EC tablet 360 mg  360 mg Oral BID Rondel Jumbo, PA-C   360 mg at 09/21/17 2217  . ondansetron (ZOFRAN) tablet 4 mg  4 mg Oral Q6H PRN Rondel Jumbo, PA-C       Or  . ondansetron [facility]) injection 4 mg  4 mg Intravenous Q6H PRN Rondel Jumbo, PA-C      . pravastatin (PRAVACHOL) tablet 80 mg  80 mg Oral QHS Rondel Jumbo, PA-C   80 mg at 09/21/17 2217  . predniSONE (DELTASONE) tablet 5 mg  5 mg Oral Q breakfast Rondel Jumbo, PA-C   5 mg at 09/21/17 1240  . senna-docusate (Senokot-S) tablet 1 tablet  1 tablet Oral QHS PRN Rondel Jumbo, PA-C      . sevelamer carbonate (RENVELA) tablet 800 mg  800 mg Oral TID WC Sharene Butters E, PA-C   800 mg at 09/21/17 2223  . tacrolimus (PROGRAF) capsule 5 mg  5 mg Oral BID Rondel Jumbo, PA-C   5 mg at 09/21/17 2217   Pt meds include: Statin :Yes Betablocker: Yes ASA: No Other anticoagulants/antiplatelets: none  Past Medical History:  Diagnosis Date  . Allergy   . Blood transfusion without reported diagnosis   . Dialysis patient ([place])    Tues,thurs,sat  . ESRD (end stage renal disease) ([place])   . Hyperlipidemia   . Hypertension     Past Surgical History:  Procedure Laterality Date  . A/V FISTULAGRAM Left 12/15/2016   Procedure: A/V Fistulagram - left;  Surgeon: Angelia Mould, MD;  Location: [place] [facility];  Service: Cardiovascular;  Laterality: Left;  . AV FISTULA PLACEMENT Left 08/28/2016   Procedure: LEFT UPPER  ARM ARTERIOVENOUS (AV) FISTULA CREATION;  Surgeon: Angelia Mould, MD;  Location: [facility] OR;  Service: Vascular;  Laterality: Left;  . IR THORACENTESIS ASP PLEURAL SPACE W/IMG GUIDE  08/22/2017   1.2 L -right-sided  . stent in kidneys     dec 2017   Social History Social History   Tobacco Use  . Smoking status: Never Smoker  .  Smokeless tobacco: Never Used  Substance Use Topics  . Alcohol use: No  . Drug use: No   Family History Family History  Problem Relation Age of Onset  . Hypertension Mother   . Heart disease Mother 67       By his report, he thinks that she had heart attack.  . Stroke Mother   . Cancer Father   .  Kidney cancer Father   . Hypertension Sister   . Heart disease Maternal Grandmother   . Alcohol abuse Maternal Grandfather   . Mental illness Paternal Grandmother   . Learning disabilities Paternal Grandmother        Alzheimer's   . Stroke Paternal Grandfather   . Colon cancer Neg Hx   . Colon polyps Neg Hx   . Esophageal cancer Neg Hx   . Rectal cancer Neg Hx   . Stomach cancer Neg Hx     Allergies  Allergen Reactions  . Azithromycin Nausea And Vomiting and Other (See Comments)    Chest tightness   REVIEW OF SYSTEMS  General: [ ]  Weight loss, [ ]  Fever, [ ]  chills Neurologic: [ ]  Dizziness, [ ]  Blackouts, [ ]  Seizure [ ]  Stroke, [ ]  "Mini stroke", [ ]  Slurred speech, [ ]  Temporary blindness; [ ]  weakness in arms or legs, [ ]  Hoarseness [ ]  Dysphagia Cardiac: [ ]  Chest pain/pressure, [x ] Shortness of breath at rest [x ] Shortness of breath with exertion, [ ]  Atrial fibrillation or irregular heartbeat  Vascular: [ ]  Pain in legs with walking, [ ]  Pain in legs at rest, [ ]  Pain in legs at night,  [ ]  Non-healing ulcer, [ ]  Blood clot in vein/DVT,   Pulmonary: [ ]  Home oxygen, [ ]  Productive cough, [ ]  Coughing up blood, [ ]  Asthma,  [ ]  Wheezing [ ]  COPD Musculoskeletal:  [ ]  Arthritis, [ ]  Low back pain, [ ]  Joint pain Hematologic: [ ]  Easy Bruising, [ ]  Anemia; [ ]  Hepatitis Gastrointestinal: [ ]  Blood in stool, [ ]  Gastroesophageal Reflux/heartburn, Urinary: [ ]  chronic Kidney disease, [x ] on HD - [ ]  MWF or [x ] TTHS, [ ]  Burning with urination, [ ]  Difficulty urinating Skin: [ ]  Rashes, [ ]  Wounds Psychological: [ ]  Anxiety, [ ]  Depression  Physical Examination Vitals:    09/22/17 0900 09/22/17 0915 09/22/17 0945 09/22/17 1015  BP: (!) 162/98 (!) 172/96 (!) 178/97 (!) 186/106  Pulse: 85 86 85 91  Resp:      Temp:      TempSrc:      SpO2:      Weight:      Height:       Body mass index is 24.45 kg/m.  General:  WDWN in NAD HENT: WNL, normocephalic Eyes: Pupils equal Pulmonary: normal non-labored breathing , without Rales, rhonchi,  wheezing Cardiac: RRR, without  Murmurs, rubs or gallops; No carotid bruits Abdomen: soft, NT, no masses Skin: no rashes, ulcers noted;  no Gangrene , no cellulitis; no open wounds;   Vascular Exam/Pulses:palapble radial pulses B.  Palpable thrill in the left UE AV fistula.  Pseudo aneurysm without thinning of dermis or ulcer on visible skin.    Musculoskeletal: no muscle wasting or atrophy;  Neurologic: A&O X 3; Appropriate Affect ;  SENSATION: normal; MOTOR FUNCTION: 5/5 Symmetric Speech is fluent/normal  Significant Diagnostic Studies: CBC Lab Results  Component Value Date   WBC 8.5 09/21/2017   HGB 9.5 (L) 09/21/2017   HCT 31.6 (L) 09/21/2017   MCV 78.0 09/21/2017   PLT 315 09/21/2017   BMET    Component Value Date/Time   NA 137 09/22/2017 0838   K 4.9 09/22/2017 0838   CL 96 (L) 09/22/2017 0838   CO2 28 09/22/2017 0838   GLUCOSE 104 (H) 09/22/2017 0838   BUN 26 (H) 09/22/2017 0838   CREATININE 4.63 (H) 09/22/2017 0175  CALCIUM 7.6 (L) 09/22/2017 0838   GFRNONAA 13 (L) 09/22/2017 0838   GFRAA 15 (L) 09/22/2017 4360   Estimated Creatinine Clearance: 17.8 mL/min (A) (by C-G formula based on SCr of 4.63 mg/dL (H)).  COAG Lab Results  Component Value Date   INR 1.01 08/28/2016   Non-Invasive Vascular Imaging:   ASSESSMENT/PLAN:  ESRD on HD T-TH-Sat left AV fistula.  Development of pseudoaneurysm since using fistula in Jan. 2019.  Patient denise prolonged bleeding and no ulcers.  He has had 3 or more infiltrate events and 3 fistulograms 2 with Dr. Augustin Coupe (study notes note available) and one  with Dr. Scot Dock.    The fistula is working well currently on HD.   Roxy Horseman 09/22/2017 10:23 AM

## 2017-09-22 NOTE — Progress Notes (Signed)
PROGRESS NOTE  Ryan [name] ATF:[id number] DOB: May 28, 1960 DOA: 09/19/2017 PCP: Orma Flaming, MD  HPI/Recap of past 24 hours:  Still has intermittent dry cough, hypoxia has resolved   Assessment/Plan: Active Problems:   Hypertension   ESRD (end stage renal disease) ([place])   Anemia of chronic disease   Pleural effusion   Hyperlipidemia   Shortness of breath   Acute respiratory distress   Acute pulmonary edema (HCC)  Acute hypoxic respiratory failure in the setting of  pulmonary edema with volume overload due to missing dialysis today/ESRD on HD T, Th S . Nephrology input appreciated, patient received dialysis on 6/29 , 6/30, 7/1, 7/2. Now off oxygen  Left AVF malfunction? Vascular surgery following  Recurrent right sided pleural effusion He is s/p Thoracentesis 08/28/17: Removed 1200 ml Pleural analysis basically unremarkable; culture results negative , done at [facility] hospital  Cardiomagly: recurrent pulmonary edema Echo to be done on 7/2 while he is in the hospital,  Cardiology consulted, cardiology recommended outpatient follow up with Dr Darcus Pester for amyloids work up and outpatient cardiac nuclear scan.   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 Cardiology consulted here, meds adjusted,    Code Status: full  Family Communication: patient   Disposition Plan: need vascular surgery clearance      Consultants:  Nephrology  Cardiology  Vascular surgery  Procedures:  HD  Antibiotics:  none   Objective: BP (!) 167/98 (BP Location: Right Arm)   Pulse 87   Temp 98.5 F (36.9 C) (Oral)   Resp (!) 23   Ht 5\' 9"  (1.753 m)   Wt 72 kg (158 lb 11.7 oz)   SpO2 94%   BMI 23.44 kg/m   Intake/Output Summary (Last 24 hours) at 09/22/2017 1447 Last data filed at 09/22/2017 1230 Gross per 24 hour  Intake 320 ml  Output 7000 ml  Net -6680 ml   Filed Weights   09/22/17 0500 09/22/17 0825 09/22/17 1230  Weight: 75.3 kg (166 lb) 75.1 kg (165 lb 9.1 oz) 72 kg (158 lb 11.7 oz)    Exam: Patient is examined daily including today on 09/22/2017, exams remain the same as of yesterday except that has changed    General:  NAD  Cardiovascular: RRR  Respiratory: diminished on the right  Abdomen: Soft/ND/NT, positive BS  Musculoskeletal: trace pedal Edema has resolved  Neuro: alert, oriented   Data Reviewed: Basic Metabolic Panel: Recent Labs  Lab 09/19/17 1135 09/20/17 0430 09/21/17 0800 09/22/17 0838  NA 138 137 137 137  K 5.5* 4.4 4.2 4.9  CL 98 97* 96* 96*  CO2 25 27 25 28   GLUCOSE 124* 142* 122* 104*  BUN 63* 45* 34* 26*  CREATININE 8.44* 7.00* 5.84* 4.63*  CALCIUM 7.6* 8.0* 8.3* 7.6*  PHOS  --   --  4.2 4.2   Liver Function Tests: Recent Labs  Lab 09/21/17 0800 09/22/17 0838  ALBUMIN 2.8* 2.6*   No results for input(s): LIPASE, AMYLASE in  the last 168 hours. No results for input(s): AMMONIA in the last 168 hours. CBC: Recent Labs  Lab 09/19/17 1135 09/20/17 0430 09/21/17 0800  WBC 7.6 7.3 8.5  HGB 9.2* 9.4* 9.5*  HCT 31.4* 32.1* 31.6*  MCV 79.5 78.3 78.0  PLT 333 328 315   Cardiac Enzymes:   Recent Labs  Lab 09/19/17 1135  TROPONINI 0.03*   BNP (last 3 results) Recent Labs    05/19/17 0446  BNP 2,037.0*    ProBNP (last 3 results) No results for input(s): PROBNP in the last 8760 hours.  CBG: Recent Labs  Lab 09/19/17 1708  09/20/17 0608 09/21/17 0503 09/21/17 2106 09/22/17 0604  GLUCAP 104* 120* 110* 118* 93    Recent Results (from the past 240 hour(s))  MRSA PCR Screening     Status: None   Collection Time: 09/19/17  5:31 PM  Result Value Ref Range Status   MRSA by PCR NEGATIVE NEGATIVE Final    Comment:        The GeneXpert MRSA Assay (FDA approved for NASAL specimens only), is one component of a comprehensive MRSA colonization surveillance program. It is not intended to diagnose MRSA infection nor to guide or monitor treatment for MRSA infections. Performed at [facility], [place] [address], [place], [place] [address]      Studies: No results found.  Scheduled Meds: . cinacalcet  30 mg Oral Q breakfast  . cloNIDine  0.1 mg Oral BID  . [START ON 09/24/2017] darbepoetin (ARANESP) injection - DIALYSIS  200 mcg Intravenous Q Thu-HD  . feeding supplement (NEPRO CARB STEADY)  237 mL Oral TID BM  . heparin  5,000 Units Subcutaneous Q8H  . labetalol  200 mg Oral BID  . losartan  25 mg Oral Daily  . multivitamin  1 tablet Oral Daily  . mycophenolate  360 mg Oral BID  . pravastatin  80 mg Oral QHS  . predniSONE  5 mg Oral Q breakfast  . sevelamer carbonate  800 mg Oral TID WC  . tacrolimus  5 mg Oral BID    Continuous Infusions:   Time spent: 7mins, I have personally reviewed and interpreted on  09/22/2017 daily labs, tele strips, imagings as discussed above under date review session and assessment and plans.  I reviewed all nursing notes, pharmacy notes, consultant notes,  vitals, pertinent old records  I have discussed plan of care as described above with RN , patient  on 09/22/2017   Florencia Reasons MD, PhD  [facility] Pager [phone]. If 7PM-7AM, please contact night-coverage at [email or web address], password [facility] 09/22/2017, 2:47 PM  LOS: 2 days

## 2017-09-22 NOTE — H&P (View-Only) (Signed)
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  I have recommended plication of this and this has been scheduled for tomorrow with Dr. Curt Jews.  The patient had complained of some intermittent problems with the fistula functioning however he has had 2 recent fistulogram's most recently on 09/16/2017.  I discussed the studies with Dr. Otelia Santee.  Most recently, the patient had venoplasty of an outflow stenosis and currently the fistula has a good thrill suggesting there is no significant recurrent stenosis.  He also commented however on hematoma adjacent to the fistula at the arterial end.  I do not see evidence of this on exam.  I have ordered a duplex to further evaluate this.  However I think there should be room to cannulate the fistula above and below the plication which she is scheduled for tomorrow.  [name], MD, FACS Beeper [phone] Office: [phone]  Reason for Consult: AV fistula pseudoaneurysm  Referring Physician: Dr. Posey Pronto  History of Present Illness: 57 y/o male with left UE brachiocephalic AV fistula.  We are being consulted for pseudoaneurysm development.    He has had issue with the fistula in the past.  He was seen by Dr. Scot Dock 11/12/2017 when a duplex showed increased velocity within the midportion of the fistula which was thought to be due to a valve.  He underwent a fistulogram 12/15/2017.  There were no abnormal findings.  The fistula was widely patent with mild narrowing.  No intervention was performed.     He was seen again on a follow up visit 01/14/2018 the fistula was used for HD 3 times prior to an infiltration occurred 01/13/2017.  At that time we recommended rest for 3 weeks and re use by 02/10/2018.  The patient states he has been seen by Dr. Augustin Coupe 2 times for intervention fistulograms since Jan 2019.  He also  states he has developed a pseudoaneurysm since Jan.  He denise thinning skin areas, ulcers, and no prolonged bleeding episodes.  He also denise weakness, pain or loss of sensation in the left UE.  He is currently on HD without problems today.  Past medical history: failed renal transplant, HTN,  Hyperlipidemia, and ESRD.    Current Facility-Administered Medications  Medication Dose Route Frequency Provider Last Rate Last Dose  . acetaminophen (TYLENOL) tablet 650 mg  650 mg Oral Q6H PRN Rondel Jumbo, PA-C       Or  . acetaminophen (TYLENOL) suppository 650 mg  650 mg Rectal Q6H PRN Rondel Jumbo, PA-C      . albuterol (PROVENTIL) (2.5 MG/3ML) 0.083% nebulizer solution 2.5 mg  2.5 mg Nebulization Q4H PRN Rondel Jumbo, PA-C      . bisacodyl (DULCOLAX) suppository 10 mg  10 mg Rectal Daily PRN Rondel Jumbo, PA-C      . cinacalcet (SENSIPAR) tablet 30 mg  30 mg Oral Q breakfast Sharene Butters E, PA-C   30 mg at 09/21/17 1142  . cloNIDine (CATAPRES) tablet 0.1 mg  0.1 mg Oral BID Kathyrn Drown D, NP   0.1 mg at 09/21/17 2217  . [START ON 09/24/2017] Darbepoetin Alfa (ARANESP) injection 200 mcg  200 mcg Intravenous Q Thu-HD Roney Jaffe, MD      . feeding supplement (NEPRO CARB STEADY) liquid 237 mL  237 mL Oral TID BM Rondel Jumbo, PA-C   237  mL at 09/21/17 2218  . guaiFENesin (MUCINEX) 12 hr tablet 1,200 mg  1,200 mg Oral BID PRN Rondel Jumbo, PA-C   1,200 mg at 09/20/17 2116  . heparin injection 5,000 Units  5,000 Units Subcutaneous Q8H Wertman, Sara E, PA-C      . hydrALAZINE (APRESOLINE) injection 10 mg  10 mg Intravenous Q6H PRN Bodenheimer, Charles A, NP   10 mg at 09/21/17 0517  . HYDROcodone-acetaminophen (NORCO/VICODIN) 5-325 MG per tablet 1-2 tablet  1-2 tablet Oral Q4H PRN Rondel Jumbo, PA-C   2 tablet at 09/20/17 0913  . labetalol (NORMODYNE) tablet 200 mg  200 mg Oral BID Kathyrn Drown D, NP   200 mg at 09/21/17 2217  . losartan (COZAAR) tablet 25 mg  25 mg Oral Daily  Kathyrn Drown D, NP      . multivitamin (RENA-VIT) tablet 1 tablet  1 tablet Oral Daily Rondel Jumbo, PA-C   1 tablet at 09/21/17 1142  . mycophenolate (MYFORTIC) EC tablet 360 mg  360 mg Oral BID Rondel Jumbo, PA-C   360 mg at 09/21/17 2217  . ondansetron (ZOFRAN) tablet 4 mg  4 mg Oral Q6H PRN Rondel Jumbo, PA-C       Or  . ondansetron [facility]) injection 4 mg  4 mg Intravenous Q6H PRN Rondel Jumbo, PA-C      . pravastatin (PRAVACHOL) tablet 80 mg  80 mg Oral QHS Rondel Jumbo, PA-C   80 mg at 09/21/17 2217  . predniSONE (DELTASONE) tablet 5 mg  5 mg Oral Q breakfast Rondel Jumbo, PA-C   5 mg at 09/21/17 1240  . senna-docusate (Senokot-S) tablet 1 tablet  1 tablet Oral QHS PRN Rondel Jumbo, PA-C      . sevelamer carbonate (RENVELA) tablet 800 mg  800 mg Oral TID WC Sharene Butters E, PA-C   800 mg at 09/21/17 2223  . tacrolimus (PROGRAF) capsule 5 mg  5 mg Oral BID Rondel Jumbo, PA-C   5 mg at 09/21/17 2217   Pt meds include: Statin :Yes Betablocker: Yes ASA: No Other anticoagulants/antiplatelets: none  Past Medical History:  Diagnosis Date  . Allergy   . Blood transfusion without reported diagnosis   . Dialysis patient ([place])    Tues,thurs,sat  . ESRD (end stage renal disease) ([place])   . Hyperlipidemia   . Hypertension     Past Surgical History:  Procedure Laterality Date  . A/V FISTULAGRAM Left 12/15/2016   Procedure: A/V Fistulagram - left;  Surgeon: Angelia Mould, MD;  Location: [facility];  Service: Cardiovascular;  Laterality: Left;  . AV FISTULA PLACEMENT Left 08/28/2016   Procedure: LEFT UPPER  ARM ARTERIOVENOUS (AV) FISTULA CREATION;  Surgeon: Angelia Mould, MD;  Location: [facility] OR;  Service: Vascular;  Laterality: Left;  . IR THORACENTESIS ASP PLEURAL SPACE W/IMG GUIDE  08/22/2017   1.2 L -right-sided  . stent in kidneys     dec 2017   Social History Social History   Tobacco Use  . Smoking status: Never Smoker  .  Smokeless tobacco: Never Used  Substance Use Topics  . Alcohol use: No  . Drug use: No   Family History Family History  Problem Relation Age of Onset  . Hypertension Mother   . Heart disease Mother 91       By his report, he thinks that she had heart attack.  . Stroke Mother   . Cancer Father   .  Kidney cancer Father   . Hypertension Sister   . Heart disease Maternal Grandmother   . Alcohol abuse Maternal Grandfather   . Mental illness Paternal Grandmother   . Learning disabilities Paternal Grandmother        Alzheimer's   . Stroke Paternal Grandfather   . Colon cancer Neg Hx   . Colon polyps Neg Hx   . Esophageal cancer Neg Hx   . Rectal cancer Neg Hx   . Stomach cancer Neg Hx     Allergies  Allergen Reactions  . Azithromycin Nausea And Vomiting and Other (See Comments)    Chest tightness   REVIEW OF SYSTEMS  General: [ ]  Weight loss, [ ]  Fever, [ ]  chills Neurologic: [ ]  Dizziness, [ ]  Blackouts, [ ]  Seizure [ ]  Stroke, [ ]  "Mini stroke", [ ]  Slurred speech, [ ]  Temporary blindness; [ ]  weakness in arms or legs, [ ]  Hoarseness [ ]  Dysphagia Cardiac: [ ]  Chest pain/pressure, [x ] Shortness of breath at rest [x ] Shortness of breath with exertion, [ ]  Atrial fibrillation or irregular heartbeat  Vascular: [ ]  Pain in legs with walking, [ ]  Pain in legs at rest, [ ]  Pain in legs at night,  [ ]  Non-healing ulcer, [ ]  Blood clot in vein/DVT,   Pulmonary: [ ]  Home oxygen, [ ]  Productive cough, [ ]  Coughing up blood, [ ]  Asthma,  [ ]  Wheezing [ ]  COPD Musculoskeletal:  [ ]  Arthritis, [ ]  Low back pain, [ ]  Joint pain Hematologic: [ ]  Easy Bruising, [ ]  Anemia; [ ]  Hepatitis Gastrointestinal: [ ]  Blood in stool, [ ]  Gastroesophageal Reflux/heartburn, Urinary: [ ]  chronic Kidney disease, [x ] on HD - [ ]  MWF or [x ] TTHS, [ ]  Burning with urination, [ ]  Difficulty urinating Skin: [ ]  Rashes, [ ]  Wounds Psychological: [ ]  Anxiety, [ ]  Depression  Physical Examination Vitals:    09/22/17 0900 09/22/17 0915 09/22/17 0945 09/22/17 1015  BP: (!) 162/98 (!) 172/96 (!) 178/97 (!) 186/106  Pulse: 85 86 85 91  Resp:      Temp:      TempSrc:      SpO2:      Weight:      Height:       Body mass index is 24.45 kg/m.  General:  WDWN in NAD HENT: WNL, normocephalic Eyes: Pupils equal Pulmonary: normal non-labored breathing , without Rales, rhonchi,  wheezing Cardiac: RRR, without  Murmurs, rubs or gallops; No carotid bruits Abdomen: soft, NT, no masses Skin: no rashes, ulcers noted;  no Gangrene , no cellulitis; no open wounds;   Vascular Exam/Pulses:palapble radial pulses B.  Palpable thrill in the left UE AV fistula.  Pseudo aneurysm without thinning of dermis or ulcer on visible skin.    Musculoskeletal: no muscle wasting or atrophy;  Neurologic: A&O X 3; Appropriate Affect ;  SENSATION: normal; MOTOR FUNCTION: 5/5 Symmetric Speech is fluent/normal  Significant Diagnostic Studies: CBC Lab Results  Component Value Date   WBC 8.5 09/21/2017   HGB 9.5 (L) 09/21/2017   HCT 31.6 (L) 09/21/2017   MCV 78.0 09/21/2017   PLT 315 09/21/2017   BMET    Component Value Date/Time   NA 137 09/22/2017 0838   K 4.9 09/22/2017 0838   CL 96 (L) 09/22/2017 0838   CO2 28 09/22/2017 0838   GLUCOSE 104 (H) 09/22/2017 0838   BUN 26 (H) 09/22/2017 0838   CREATININE 4.63 (H) 09/22/2017 4332  CALCIUM 7.6 (L) 09/22/2017 0838   GFRNONAA 13 (L) 09/22/2017 0838   GFRAA 15 (L) 09/22/2017 1771   Estimated Creatinine Clearance: 17.8 mL/min (A) (by C-G formula based on SCr of 4.63 mg/dL (H)).  COAG Lab Results  Component Value Date   INR 1.01 08/28/2016   Non-Invasive Vascular Imaging:   ASSESSMENT/PLAN:  ESRD on HD T-TH-Sat left AV fistula.  Development of pseudoaneurysm since using fistula in Jan. 2019.  Patient denise prolonged bleeding and no ulcers.  He has had 3 or more infiltrate events and 3 fistulograms 2 with Dr. Augustin Coupe (study notes note available) and one  with Dr. Scot Dock.    The fistula is working well currently on HD.   Roxy Horseman 09/22/2017 10:23 AM

## 2017-09-22 NOTE — Progress Notes (Signed)
Progress Note  Patient Name: Ryan [name] Date of Encounter: 09/22/2017  Primary Cardiologist: Dr. Ellyn Hack, MD   Subjective   Pt seen in dialysis. Doing well. No complaints of chest pain, palpitations, or SOB.   Inpatient Medications    Scheduled Meds: . cinacalcet  30 mg Oral Q breakfast  . cloNIDine  0.1 mg Oral BID  . [START ON 09/24/2017] darbepoetin (ARANESP) injection - DIALYSIS  200 mcg Intravenous Q Thu-HD  . feeding supplement (NEPRO CARB STEADY)  237 mL Oral TID BM  . heparin  5,000 Units Subcutaneous Q8H  . labetalol  200 mg Oral BID  . multivitamin  1 tablet Oral Daily  . mycophenolate  360 mg Oral BID  . pravastatin  80 mg Oral QHS  . predniSONE  5 mg Oral Q breakfast  . sevelamer carbonate  800 mg Oral TID WC  . tacrolimus  5 mg Oral BID   Continuous Infusions:  PRN Meds: acetaminophen **OR** acetaminophen, albuterol, bisacodyl, guaiFENesin, hydrALAZINE, HYDROcodone-acetaminophen, ondansetron **OR** ondansetron (ZOFRAN) IV, senna-docusate   Vital Signs    Vitals:   09/22/17 0830 09/22/17 0835 09/22/17 0900 09/22/17 0915  BP: (!) 168/94 (!) 167/95 (!) 162/98 (!) 172/96  Pulse: 87 84 85 86  Resp:  (!) 22    Temp:      TempSrc:      SpO2:      Weight:      Height:        Intake/Output Summary (Last 24 hours) at 09/22/2017 0950 Last data filed at 09/21/2017 2218 Gross per 24 hour  Intake 320 ml  Output 4000 ml  Net -3680 ml   Filed Weights   09/21/17 1920 09/22/17 0500 09/22/17 0825  Weight: 163 lb 12.8 oz (74.3 kg) 166 lb (75.3 kg) 165 lb 9.1 oz (75.1 kg)    Physical Exam   General: Well developed, well nourished, NAD Skin: Warm, dry, intact  Head: Normocephalic, atraumatic, clear, moist mucus membranes. Neck: Negative for carotid bruits. No JVD Lungs:Clear to ausculation bilaterally. No wheezes, rales, or rhonchi. Breathing is unlabored. Cardiovascular: RRR with S1 S2. No murmurs, rubs or gallops Abdomen: Soft, non-tender,  non-distended with normoactive bowel sounds.No obvious abdominal masses. MSK: Strength and tone appear normal for age. 5/5 in all extremities Extremities: Mild 1+ LE edema. No clubbing or cyanosis. DP/PT pulses 2+ bilaterally Neuro: Alert and oriented. No focal deficits. No facial asymmetry. MAE spontaneously. Psych: Responds to questions appropriately with normal affect.    Labs    Chemistry Recent Labs  Lab 09/20/17 0430 09/21/17 0800 09/22/17 0838  NA 137 137 137  K 4.4 4.2 4.9  CL 97* 96* 96*  CO2 27 25 28   GLUCOSE 142* 122* 104*  BUN 45* 34* 26*  CREATININE 7.00* 5.84* 4.63*  CALCIUM 8.0* 8.3* 7.6*  ALBUMIN  --  2.8* 2.6*  GFRNONAA 8* 10* 13*  GFRAA 9* 11* 15*  ANIONGAP 13 16* 13     Hematology Recent Labs  Lab 09/19/17 1135 09/20/17 0430 09/21/17 0800  WBC 7.6 7.3 8.5  RBC 3.95* 4.10* 4.05*  HGB 9.2* 9.4* 9.5*  HCT 31.4* 32.1* 31.6*  MCV 79.5 78.3 78.0  MCH 23.3* 22.9* 23.5*  MCHC 29.3* 29.3* 30.1  RDW 23.4* 23.6* 23.5*  PLT 333 328 315    Cardiac Enzymes Recent Labs  Lab 09/19/17 1135  TROPONINI 0.03*   No results for input(s): TROPIPOC in the last 168 hours.   BNPNo results for input(s): BNP,  PROBNP in the last 168 hours.   DDimer No results for input(s): DDIMER in the last 168 hours.   Radiology    No results found.  Telemetry    09/22/17 NSR - Personally Reviewed  ECG    No new tracings as of 09/22/17 - Personally Reviewed  Cardiac Studies   Echocardiogram: Pending results   Patient Profile     57 y.o. male with a hx of  ESRD (T,TH, Sat) s/p failed renal transplant (2010), HTN, anemia of chronic disease and hyperlipidemia who is being seen today for the for echocardiogram and myocardial perfusion study at the request of Dr. Erlinda Hong.  Assessment & Plan    1.  Acute hypoxic respiratory failure in the setting of pulmonary edema secondary to missed dialysis: -Patient presented to [facility]2017 with acute onset of shortness of breath  and exertional dyspnea however, it appears that this has been progressively worsening for the last several months -CXR persistent interstitial pulmonary edema and moderate-sized right pleural effusion with right lower lobe atelectasis -Fluid volume managed by nephrology with HD -Reports feeling improved after dialysis -Patient was initially seen by Dr. Ellyn Hack on 09/14/2017 for similar complaints in which an echocardiogram and outpatient myocardial perfusion study was ordered>>echo ordered with pending results  -Consider myocardial perfusion to be completed OP with close follow up   2.  History of chest pain, exertional dyspnea: -Pt denies chest pain this admission however, was recently seen by Dr. Ellyn Hack as a referral from his PCP for exertional dyspnea. Pt is currently comfortable, undergoing HD  -He was scheduled for an outpatient echocardiogram and myocardial perfusion study which was to be completed 09/21/2017 -Troponin, 0.03 -EKG, unremarkable with no acute ischemic changes, there is evidence of mild T wave inversions in anterior leads -Will restart statin, labetalol  3.  Hypertension: -Elevated, 172/96, 162/98, 167/95 -Patient is on home labetalol 200 twice daily, losartan 25 daily, clonidine 0.2 mg daily -Will restart losartan 25mg  today given persistently elevated BP  4.  End-stage renal disease status post failed renal transplant: -Creatinine, 4.63 today  -Dialysis T, TH, Sat -Fluid volume management per nephrology  5.  Hyperlipidemia: -Last lipid panel 06/10/2017, CHO- 162, HDL-56, LDL-83,Trigs-117 -Continue statin  Signed, Kathyrn Drown NP-C [facility] Pager: [phone] 09/22/2017, 9:50 AM     For questions or updates, please contact   Please consult [email or web address] for contact info under Cardiology/STEMI.

## 2017-09-23 ENCOUNTER — Inpatient Hospital Stay (HOSPITAL_COMMUNITY): Payer: Medicare Other

## 2017-09-23 ENCOUNTER — Inpatient Hospital Stay (HOSPITAL_COMMUNITY): Payer: Medicare Other | Admitting: Anesthesiology

## 2017-09-23 ENCOUNTER — Ambulatory Visit (HOSPITAL_COMMUNITY): Admission: RE | Admit: 2017-09-23 | Payer: Medicare Other | Source: Ambulatory Visit

## 2017-09-23 ENCOUNTER — Encounter (HOSPITAL_COMMUNITY): Payer: Self-pay | Admitting: *Deleted

## 2017-09-23 ENCOUNTER — Encounter (HOSPITAL_COMMUNITY)
Admission: EM | Disposition: A | Discharge status: Home / Self Care | Payer: Self-pay | Source: Home / Self Care | Attending: Internal Medicine

## 2017-09-23 ENCOUNTER — Telehealth: Payer: Self-pay | Admitting: Family Medicine

## 2017-09-23 DIAGNOSIS — J90 Pleural effusion, not elsewhere classified: Secondary | ICD-10-CM

## 2017-09-23 DIAGNOSIS — J810 Acute pulmonary edema: Secondary | ICD-10-CM

## 2017-09-23 DIAGNOSIS — R0603 Acute respiratory distress: Secondary | ICD-10-CM

## 2017-09-23 DIAGNOSIS — I10 Essential (primary) hypertension: Secondary | ICD-10-CM

## 2017-09-23 DIAGNOSIS — E785 Hyperlipidemia, unspecified: Secondary | ICD-10-CM

## 2017-09-23 DIAGNOSIS — I729 Aneurysm of unspecified site: Secondary | ICD-10-CM

## 2017-09-23 DIAGNOSIS — D638 Anemia in other chronic diseases classified elsewhere: Secondary | ICD-10-CM

## 2017-09-23 HISTORY — PX: FISTULA SUPERFICIALIZATION: SHX6341

## 2017-09-23 LAB — BASIC METABOLIC PANEL
Anion gap: 9 (ref 5–15)
BUN: 28 mg/dL — ABNORMAL HIGH (ref 6–20)
CALCIUM: 7.8 mg/dL — AB (ref 8.9–10.3)
CO2: 29 mmol/L (ref 22–32)
Chloride: 97 mmol/L — ABNORMAL LOW (ref 98–111)
Creatinine, Ser: 4.23 mg/dL — ABNORMAL HIGH (ref 0.61–1.24)
GFR calc non Af Amer: 14 mL/min — ABNORMAL LOW (ref >60)
GFR, EST AFRICAN AMERICAN: 17 mL/min — AB (ref >60)
Glucose, Bld: 104 mg/dL — ABNORMAL HIGH (ref 70–99)
Potassium: 3.8 mmol/L (ref 3.5–5.1)
SODIUM: 135 mmol/L (ref 135–145)

## 2017-09-23 LAB — CBC
HCT: 30.5 % — ABNORMAL LOW (ref 39.0–52.0)
Hemoglobin: 9 g/dL — ABNORMAL LOW (ref 13.0–17.0)
MCH: 23 pg — AB (ref 26.0–34.0)
MCHC: 29.5 g/dL — ABNORMAL LOW (ref 30.0–36.0)
MCV: 77.8 fL — ABNORMAL LOW (ref 78.0–100.0)
Platelets: 296 10*3/uL (ref 150–400)
RBC: 3.92 MIL/uL — AB (ref 4.22–5.81)
RDW: 23.7 % — ABNORMAL HIGH (ref 11.5–15.5)
WBC: 7.5 10*3/uL (ref 4.0–10.5)

## 2017-09-23 LAB — SURGICAL PCR SCREEN
MRSA, PCR: NEGATIVE
STAPHYLOCOCCUS AUREUS: NEGATIVE

## 2017-09-23 LAB — GLUCOSE, CAPILLARY: Glucose-Capillary: 114 mg/dL — ABNORMAL HIGH (ref 70–99)

## 2017-09-23 LAB — PROTIME-INR
INR: 1.13
PROTHROMBIN TIME: 14.4 s (ref 11.4–15.2)

## 2017-09-23 SURGERY — FISTULA SUPERFICIALIZATION
Anesthesia: Monitor Anesthesia Care | Site: Arm Upper | Laterality: Left

## 2017-09-23 MED ORDER — HEPARIN SODIUM (PORCINE) 1000 UNIT/ML IJ SOLN
INTRAMUSCULAR | Status: DC | PRN
  Administered 2017-09-23: 3000 [IU] via INTRAVENOUS

## 2017-09-23 MED ORDER — LIDOCAINE HCL (PF) 1 % IJ SOLN
INTRAMUSCULAR | Status: DC | PRN
  Administered 2017-09-23: 15 mL

## 2017-09-23 MED ORDER — SODIUM CHLORIDE 0.9 % IV SOLN
INTRAVENOUS | Status: DC | PRN
  Administered 2017-09-23: 17:00:00 via INTRAVENOUS

## 2017-09-23 MED ORDER — ONDANSETRON HCL 4 MG/2ML IJ SOLN: 4.0000 mg | Freq: Once | INTRAMUSCULAR | Status: DC | PRN

## 2017-09-23 MED ORDER — PROPOFOL 10 MG/ML IV BOLUS
INTRAVENOUS | Status: DC | PRN
  Administered 2017-09-23 (×9): 20 mg via INTRAVENOUS

## 2017-09-23 MED ORDER — PROPOFOL 1000 MG/100ML IV EMUL
INTRAVENOUS | Status: AC
  Filled 2017-09-23: qty 100

## 2017-09-23 MED ORDER — LIDOCAINE HCL (PF) 1 % IJ SOLN
INTRAMUSCULAR | Status: AC
  Filled 2017-09-23: qty 30

## 2017-09-23 MED ORDER — PROPOFOL 10 MG/ML IV BOLUS
INTRAVENOUS | Status: DC | PRN
  Administered 2017-09-23 (×2): 100 ug/kg/min via INTRAVENOUS

## 2017-09-23 MED ORDER — SODIUM CHLORIDE 0.9 % IV SOLN
INTRAVENOUS | Status: DC | PRN
  Administered 2017-09-23: 500 mL

## 2017-09-23 MED ORDER — MIDAZOLAM HCL 2 MG/2ML IJ SOLN
INTRAMUSCULAR | Status: AC
  Filled 2017-09-23: qty 2

## 2017-09-23 MED ORDER — FENTANYL CITRATE (PF) 100 MCG/2ML IJ SOLN: 25.0000 ug | INTRAMUSCULAR | Status: DC | PRN

## 2017-09-23 MED ORDER — FENTANYL CITRATE (PF) 250 MCG/5ML IJ SOLN
INTRAMUSCULAR | Status: AC
  Filled 2017-09-23: qty 5

## 2017-09-23 MED ORDER — CEFAZOLIN SODIUM-DEXTROSE 1-4 GM/50ML-% IV SOLN
INTRAVENOUS | Status: DC | PRN
  Administered 2017-09-23: 1 g via INTRAVENOUS

## 2017-09-23 MED ORDER — SODIUM CHLORIDE 0.9 % IV SOLN
INTRAVENOUS | Status: AC
  Filled 2017-09-23: qty 1.2

## 2017-09-23 MED ORDER — PROTAMINE SULFATE 10 MG/ML IV SOLN
INTRAVENOUS | Status: DC | PRN
  Administered 2017-09-23: 30 mg via INTRAVENOUS

## 2017-09-23 MED ORDER — HEPARIN SODIUM (PORCINE) 1000 UNIT/ML IJ SOLN
INTRAMUSCULAR | Status: AC
  Filled 2017-09-23: qty 3

## 2017-09-23 MED ORDER — PROTAMINE SULFATE 10 MG/ML IV SOLN
INTRAVENOUS | Status: AC
  Filled 2017-09-23: qty 10

## 2017-09-23 MED ORDER — GUAIFENESIN ER 600 MG PO TB12
1200.0000 mg | ORAL_TABLET | Freq: Two times a day (BID) | ORAL | Status: DC
  Administered 2017-09-23 – 2017-09-25 (×3): 1200 mg via ORAL
  Filled 2017-09-23 (×4): qty 2

## 2017-09-23 MED ORDER — 0.9 % SODIUM CHLORIDE (POUR BTL) OPTIME
TOPICAL | Status: DC | PRN
  Administered 2017-09-23: 1000 mL

## 2017-09-23 MED ORDER — OXYCODONE HCL 5 MG/5ML PO SOLN: 5.0000 mg | Freq: Once | ORAL | Status: DC | PRN

## 2017-09-23 MED ORDER — MIDAZOLAM HCL 5 MG/5ML IJ SOLN
INTRAMUSCULAR | Status: DC | PRN
  Administered 2017-09-23 (×2): 1 mg via INTRAVENOUS

## 2017-09-23 MED ORDER — CHLORHEXIDINE GLUCONATE CLOTH 2 % EX PADS
6.0000 | MEDICATED_PAD | Freq: Every day | CUTANEOUS | Status: DC
  Administered 2017-09-23: 6 via TOPICAL

## 2017-09-23 MED ORDER — OXYCODONE HCL 5 MG PO TABS: 5.0000 mg | ORAL_TABLET | Freq: Once | ORAL | Status: DC | PRN

## 2017-09-23 MED ORDER — FENTANYL CITRATE (PF) 250 MCG/5ML IJ SOLN
INTRAMUSCULAR | Status: DC | PRN
  Administered 2017-09-23: 25 ug via INTRAVENOUS
  Administered 2017-09-23 (×2): 50 ug via INTRAVENOUS
  Administered 2017-09-23: 25 ug via INTRAVENOUS
  Administered 2017-09-23 (×2): 50 ug via INTRAVENOUS

## 2017-09-23 SURGICAL SUPPLY — 32 items
ADH SKN CLS APL DERMABOND .7 (GAUZE/BANDAGES/DRESSINGS) ×1
AGENT HMST SPONGE THK3/8 (HEMOSTASIS)
ARMBAND PINK RESTRICT EXTREMIT (MISCELLANEOUS) ×2 IMPLANT
CANISTER SUCT 3000ML PPV (MISCELLANEOUS) ×2 IMPLANT
CANNULA VESSEL 3MM 2 BLNT TIP (CANNULA) ×1 IMPLANT
CLIP VESOCCLUDE MED 6/CT (CLIP) ×2 IMPLANT
CLIP VESOCCLUDE SM WIDE 6/CT (CLIP) ×2 IMPLANT
COVER PROBE W GEL 5X96 (DRAPES) IMPLANT
DERMABOND ADVANCED (GAUZE/BANDAGES/DRESSINGS) ×1
DERMABOND ADVANCED .7 DNX12 (GAUZE/BANDAGES/DRESSINGS) ×1 IMPLANT
DRAIN PENROSE 1/4X12 LTX STRL (WOUND CARE) ×1 IMPLANT
ELECT CAUTERY BLADE 6.4 (BLADE) ×1 IMPLANT
ELECT REM PT RETURN 9FT ADLT (ELECTROSURGICAL) ×2
ELECTRODE REM PT RTRN 9FT ADLT (ELECTROSURGICAL) ×1 IMPLANT
GLOVE BIO SURGEON STRL SZ7.5 (GLOVE) ×3 IMPLANT
GOWN STRL REUS W/ TWL LRG LVL3 (GOWN DISPOSABLE) ×3 IMPLANT
GOWN STRL REUS W/TWL LRG LVL3 (GOWN DISPOSABLE) ×3
HEMOSTAT SPONGE AVITENE ULTRA (HEMOSTASIS) IMPLANT
KIT BASIN OR (CUSTOM PROCEDURE TRAY) ×2 IMPLANT
KIT TURNOVER KIT B (KITS) ×2 IMPLANT
LOOP VESSEL MINI RED (MISCELLANEOUS) ×1 IMPLANT
NS IRRIG 1000ML POUR BTL (IV SOLUTION) ×2 IMPLANT
PACK CV ACCESS (CUSTOM PROCEDURE TRAY) ×2 IMPLANT
PAD ARMBOARD 7.5X6 YLW CONV (MISCELLANEOUS) ×4 IMPLANT
SUT PROLENE 5 0 C 1 24 (SUTURE) ×2 IMPLANT
SUT PROLENE 7 0 BV 1 (SUTURE) ×3 IMPLANT
SUT VIC AB 3-0 SH 27 (SUTURE) ×1
SUT VIC AB 3-0 SH 27X BRD (SUTURE) ×1 IMPLANT
SUT VICRYL 4-0 PS2 18IN ABS (SUTURE) ×2 IMPLANT
TOWEL GREEN STERILE (TOWEL DISPOSABLE) ×2 IMPLANT
UNDERPAD 30X30 (UNDERPADS AND DIAPERS) ×2 IMPLANT
WATER STERILE IRR 1000ML POUR (IV SOLUTION) ×2 IMPLANT

## 2017-09-23 NOTE — Progress Notes (Signed)
Pt going to OR via bed for plication of L arm fistula.

## 2017-09-23 NOTE — Progress Notes (Signed)
PROGRESS NOTE    Ryan [name]  QAS:[id number] DOB: Dec 12, 1960 DOA: 09/19/2017 PCP: Orma Flaming, MD   Brief Narrative:  The patient is a 57 year old with past medical history relevant for ESRD status post failed renal transplant now on dialysis Tuesday/Thursday/Saturday, poorly controlled hypertension, hyperlipidemia and other comorbids who presents to the ED with severe shortness of breath after missing dialysis on the day of admission. Patient reported that for the past day he has been feeling quite poorly.  He has been under significant amount of stress as he is currently homeless and has been living with various relatives who has had interpersonal conflicts with.  He was in an extended stay hotel and was getting ready to move into new housing.  He went to sleep last night significant and worried for his sister and began to feel poorly.  He called dialysis and reportedly did not feel well.  He also began to notice shortness of breath. He reported that he has chronic lower extremity edema but it is worsened dramatically.  EMS was called and he was noted to be hypoxic and in mild respiratory distress.  He was given breathing treatments and supplemental oxygen and was transferred to the ED. He was admitted for acute respiratory failure with hypoxia with volume removal from missing dialysis.  Left AV fistula is currently being also investigated and patient is to undergo plication of focal pseudoaneurysm in the midportion of his left brachiocephalic fistula today.   Assessment & Plan:   Active Problems:   Hypertension   ESRD (end stage renal disease) (HCC)   Anemia of chronic disease   Pleural effusion   Hyperlipidemia   Shortness of breath   Acute respiratory distress   Acute pulmonary edema ([place])  Acute Hypoxic Respiratory Failurein the setting of pulmonary edema with volume overload due to missing dialysis. -Continuous pulse oximetry and maintain O2 saturations greater than  92% -Continue with supplemental oxygen as necessary and wean as tolerated -Now has been off of oxygen as he received dialysis on 6/29, 6/30, 7/1, 7/2 -We will need a ambulatory home O2 screen prior to discharge -Repeat chest x-ray in a.m.  Left AVF malfunction? -Recently had 2 Fistulograms  -Vascular Surgery Consulted for AV Fistula Pseudoaneurysm  -Vascular surgery following and patient to undergo plication of focal pseudoaneurysm in the midportion of this Left BCF -U/S of Fistula as below   ESRD with Hx of Failed Renal Transplant due to inability of patient to obtain Anti-Rejection medications -Has HD on T/Th/Sat; Now BUN/Cr is 28/4.23 -Was dialyzed 4 days and now back on his normal schedule per nephrology given that his volume status is improved -Cinacalcet 30 mils p.o. daily with breakfast, Rena-Vite 1 tab p.o. daily, and Sevelamer 800 mg p.o. 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 -Cardiology recommended outpatient follow up with Dr Darcus Pester for amyloid work up and outpatient cardiac nuclear scan.  H/o living donor renal transplant in 2010: failed and back  on Hemodialysis  -He remained to be on Prograf, Myfortic, and prednisone, he is followed by transplant nephrology at [facility] -He was recently evaluated by transplant nephrology at [facility] when he was hospitalized from  6/6-6/10  Anemia of Chronic Disease -In the setting of ESRD -He will only have been stable around 9 -Current hemoglobin/hematocrit was 9.0/30.5 -Continue to monitor for signs and symptoms of bleeding -Repeat CBC in a.m.  H/o Bloody drainage from penis, chronic, intermittent  H/o right ureter stent in 2017 -He is followed by [facility] -Currently not having any complaints   HTN -He was recently evaluated by cardiology Dr Ellyn Hack who plan to continue adjust his blood pressure meds. -Cardiology consulted here, meds adjusted and will continue to monitor -C/w clonidine 0.1 mg p.o. twice daily, hydralazine 10 mg IV every 6 as needed for high blood pressure with systolic blood pressure greater than 956 or diastolic blood pressure greater than 100, continue with labetalol 200 mg p.o. twice daily, losartan 25 mg p.o. Daily  HLD -C/w Pravastatin 80 mg po qHS  DVT prophylaxis: Heparin 5000 units subcu every 8 hours Code Status: FULL CODE Family Communication: No family present at bedside  Disposition Plan: Pending Vascular Clearance for Discharge   Consultants:   Nephrology  Vascular Surgery  Cardiology   Procedures:   ECHOCARDIOGRAM ------------------------------------------------------------------- Study Conclusions  - Left ventricle: The cavity size was normal. Wall thickness was   increased in a pattern of severe LVH. Systolic function was   normal. The estimated ejection fraction was in the range of 55%   to 60%. Wall motion was normal; there were no regional wall   motion abnormalities. Features are consistent with a pseudonormal   left ventricular filling pattern, with concomitant abnormal   relaxation and increased filling pressure (grade 2 diastolic   dysfunction). Doppler parameters are consistent with high   ventricular filling pressure. - Aortic valve: Valve mobility was restricted. There was very mild   stenosis. There was mild regurgitation. -  Aortic root: The aortic root was mildly dilated. - Ascending aorta: The ascending aorta was mildly dilated. - Left atrium: The atrium was mildly dilated. - Right ventricle: The cavity size was moderately dilated. Systolic   function was moderately reduced. - Right atrium: The atrium was mildly dilated. - Pericardium, extracardiac: A small pericardial effusion was   identified.  Impressions:  - Normal LV systolic function; severe LVH; myocardium with speckled   appearance; fixed right coronary cusp with very mild AS (mean   gradient 10 mmHg) and mild AI; mildly dilated aortic   root/ascending aorta; mild biatrial enlargement; moderate RVE   with moderate RV dysfunction; small pericardial effusion;   findings suggestive of amyloid; suggest cardiac MRI or Tc-PYP   scan to further assess.  AVF Dialysis Access U/S Duplex Dialysis Access (AVF, AGV) has been completed.   The left brachio-cephalic fistula is patent. There is a pseudoaneurysm of the left cephalic vein in the distal upper arm that measures 2.2 x 1.8 cm. There is an area of dilatation in the left cephalic vein in the left mid-distal upper arm, measuring from 1.03cm to 1.59cm, to 1.17cm. There are two branches of the left cephalic vein at the proximal upper arm. There is an area at the origin of the cephalic vein that exhibits elevated velocities without evidence of significant luminal narrowing by color Doppler.   Antimicrobials:  Anti-infectives (From admission, onward)   Start     Dose/Rate Route Frequency Ordered Stop  09/23/17 0600  ceFAZolin (ANCEF) IVPB 1 g/50 mL premix    Note to Pharmacy:  Send with pt to OR   1 g 100 mL/hr over 30 Minutes Intravenous On call 09/22/17 1536 09/24/17 0600     Subjective: Seen and examined he states that shortness of breath was much improved.  Awaiting to go down for his procedure later today.  No chest pain, shortness breath, lightheadedness or dizziness.  States his leg swelling is  much better and had no other concerns or complaints at this time  Objective: Vitals:   09/22/17 1230 09/22/17 1809 09/22/17 2015 09/23/17 0458  BP: (!) 167/98 (!) 151/91 (!) 163/96 (!) 168/100  Pulse: 87 83 84 81  Resp: (!) 23 16 18 16   Temp: 98.5 F (36.9 C) 98.7 F (37.1 C) 98.7 F (37.1 C) 98.6 F (37 C)  TempSrc: Oral Oral Oral Oral  SpO2: 94% 100% 95% 99%  Weight: 72 kg (158 lb 11.7 oz)     Height:        Intake/Output Summary (Last 24 hours) at 09/23/2017 3545 Last data filed at 09/23/2017 0600 Gross per 24 hour  Intake 437 ml  Output 3000 ml  Net -2563 ml   Filed Weights   09/22/17 0500 09/22/17 0825 09/22/17 1230  Weight: 75.3 kg (166 lb) 75.1 kg (165 lb 9.1 oz) 72 kg (158 lb 11.7 oz)   Examination: Physical Exam:  Constitutional: WN/WD AAM in NAD and appears calm and comfortable Eyes: Lids and conjunctivae normal, sclerae anicteric  ENMT: External Ears, Nose appear normal. Grossly normal hearing. Mucous membranes are moist. Neck: Appears normal, supple, no cervical masses, normal ROM, no appreciable thyromegaly, no JVD Respiratory: Diminished to auscultation bilaterally but more so on the Right compared to the Left. No wheezing, rales, rhonchi or crackles. Normal respiratory effort and patient is not tachypenic. No accessory muscle use.  Cardiovascular: RRR, no murmurs / rubs / gallops. S1 and S2 auscultated. No extremity edema.   Abdomen: Soft, non-tender, non-distended. No masses palpated. No appreciable hepatosplenomegaly. Bowel sounds positive x4.  GU: Deferred. Musculoskeletal: Left AVF in Place with good bruit and palpable thrill Skin: No rashes, lesions, ulcers on a limited skin evaluation. No induration; Warm and dry.  Neurologic: CN 2-12 grossly intact with no focal deficits. Romberg sign and  cerebellar reflexes not assessed.  Psychiatric: Normal judgment and insight. Alert and oriented x 3. Normal mood and appropriate affect.   Data Reviewed: I have  personally reviewed following labs and imaging studies  CBC: Recent Labs  Lab 09/19/17 1135 09/20/17 0430 09/21/17 0800 09/23/17 0428  WBC 7.6 7.3 8.5 7.5  HGB 9.2* 9.4* 9.5* 9.0*  HCT 31.4* 32.1* 31.6* 30.5*  MCV 79.5 78.3 78.0 77.8*  PLT 333 328 315 625   Basic Metabolic Panel: Recent Labs  Lab 09/19/17 1135 09/20/17 0430 09/21/17 0800 09/22/17 0838 09/23/17 0428  NA 138 137 137 137 135  K 5.5* 4.4 4.2 4.9 3.8  CL 98 97* 96* 96* 97*  CO2 25 27 25 28 29   GLUCOSE 124* 142* 122* 104* 104*  BUN 63* 45* 34* 26* 28*  CREATININE 8.44* 7.00* 5.84* 4.63* 4.23*  CALCIUM 7.6* 8.0* 8.3* 7.6* 7.8*  PHOS  --   --  4.2 4.2  --    GFR: Estimated Creatinine Clearance: 19.5 mL/min (A) (by C-G formula based on SCr of 4.23 mg/dL (H)). Liver Function Tests: Recent Labs  Lab 09/21/17 0800 09/22/17 0838  ALBUMIN 2.8* 2.6*  No results for input(s): LIPASE, AMYLASE in the last 168 hours. No results for input(s): AMMONIA in the last 168 hours. Coagulation Profile: Recent Labs  Lab 09/23/17 0428  INR 1.13   Cardiac Enzymes: Recent Labs  Lab 09/19/17 1135  TROPONINI 0.03*   BNP (last 3 results) No results for input(s): PROBNP in the last 8760 hours. HbA1C: No results for input(s): HGBA1C in the last 72 hours. CBG: Recent Labs  Lab 09/20/17 0608 09/21/17 0503 09/21/17 2106 09/22/17 0604 09/22/17 1806  GLUCAP 120* 110* 118* 93 122*   Lipid Profile: No results for input(s): CHOL, HDL, LDLCALC, TRIG, CHOLHDL, LDLDIRECT in the last 72 hours. Thyroid Function Tests: No results for input(s): TSH, T4TOTAL, FREET4, T3FREE, THYROIDAB in the last 72 hours. Anemia Panel: No results for input(s): VITAMINB12, FOLATE, FERRITIN, TIBC, IRON, RETICCTPCT in the last 72 hours. Sepsis Labs: No results for input(s): PROCALCITON, LATICACIDVEN in the last 168 hours.  Recent Results (from the past 240 hour(s))  MRSA PCR Screening     Status: None   Collection Time: 09/19/17  5:31 PM   Result Value Ref Range Status   MRSA by PCR NEGATIVE NEGATIVE Final    Comment:        The GeneXpert MRSA Assay (FDA approved for NASAL specimens only), is one component of a comprehensive MRSA colonization surveillance program. It is not intended to diagnose MRSA infection nor to guide or monitor treatment for MRSA infections. Performed at [facility], [place] [address], [place], [place] [address]     Radiology Studies: No results found.  Scheduled Meds: . cinacalcet  30 mg Oral Q breakfast  . cloNIDine  0.1 mg Oral BID  . [START ON 09/24/2017] darbepoetin (ARANESP) injection - DIALYSIS  200 mcg Intravenous Q Thu-HD  . feeding supplement (NEPRO CARB STEADY)  237 mL Oral TID BM  . heparin  5,000 Units Subcutaneous Q8H  . labetalol  200 mg Oral BID  . losartan  25 mg Oral Daily  . multivitamin  1 tablet Oral Daily  . mycophenolate  360 mg Oral BID  . pravastatin  80 mg Oral QHS  . predniSONE  5 mg Oral Q breakfast  . sevelamer carbonate  800 mg Oral TID WC  . tacrolimus  5 mg Oral BID   Continuous Infusions: .  ceFAZolin (ANCEF) IV      LOS: 3 days    Kerney Elbe, DO [facility] Pager [phone]  If 7PM-7AM, please contact night-coverage [email or web address] Password [email or web address] 09/23/2017, 8:07 AM

## 2017-09-23 NOTE — Care Management Important Message (Signed)
Important Message  Patient Details  Name: Ryan [name] MRN: [id number] Date of Birth: [birth year]   Medicare Important Message Given:  Yes    Orbie Pyo 09/23/2017, 2:54 PM

## 2017-09-23 NOTE — Telephone Encounter (Signed)
Dr. Rogers Blocker already aware that patient is in the hospital.

## 2017-09-23 NOTE — Progress Notes (Signed)
[name] Progress Note   Subjective:  No complaints this morning. Breathing improved. Lying flat in bed.  Problems cannulating fistula - VVS evaluating   Objective Vitals:   09/22/17 1230 09/22/17 1809 09/22/17 2015 09/23/17 0458  BP: (!) 167/98 (!) 151/91 (!) 163/96 (!) 168/100  Pulse: 87 83 84 81  Resp: (!) 23 16 18 16   Temp: 98.5 F (36.9 C) 98.7 F (37.1 C) 98.7 F (37.1 C) 98.6 F (37 C)  TempSrc: Oral Oral Oral Oral  SpO2: 94% 100% 95% 99%  Weight: 72 kg (158 lb 11.7 oz)     Height:       Physical Exam General: WNWD male lying in bed  Heart: RRR  Lungs: CTAB  Abdomen: soft NT  Extremities: Trace LE edema  Dialysis Access: LUE AVF +bruit aneurysmal   Dialysis Orders:  NW TTS  4h  78kg  2/2 bath LFA AVF  Heparin none  - mircera 225 last 6/20  - no other meds    Assessment/Plan: 1.  Volume overload/respiratory distress/pulmonary edema: With consecutive hemodialysis for the past 3 days and significant volume unloading with good clinical/symptomatic improvement of pulmonary edema.  We discussed the importance of continued stringently with restriction of interdialytic weight gain and sodium intake. Well below EDW. Post HD weight  On 7/2 72kg. Plan to lower at discharge  2. ESRD: Usually TTS schedule-now back on his usual outpatient schedule with improved volume status. 3. Anemia: Hemoglobin/hematocrit remain low, continue ESA. Aranesp 200 scheduled for 7/4  Continue renal diet/renal multivitamin: Continue on current phosphorus binders and vitamin D receptor analog/Sensipar 5. Nutrition: Continue renal multivitamin/renal diet. 6. Hypertension: Blood pressure elevated, monitor with ultrafiltration at hemodialysis. Continue titrate volume down as tolerated 7. Hx renal transplant    Lynnda Child PA-C [facility] Pager [phone] 09/23/2017,9:52 AM  LOS: 3 days   Additional Objective Labs: Basic Metabolic Panel: Recent Labs   Lab 09/21/17 0800 09/22/17 0838 09/23/17 0428  NA 137 137 135  K 4.2 4.9 3.8  CL 96* 96* 97*  CO2 25 28 29   GLUCOSE 122* 104* 104*  BUN 34* 26* 28*  CREATININE 5.84* 4.63* 4.23*  CALCIUM 8.3* 7.6* 7.8*  PHOS 4.2 4.2  --    CBC: Recent Labs  Lab 09/19/17 1135 09/20/17 0430 09/21/17 0800 09/23/17 0428  WBC 7.6 7.3 8.5 7.5  HGB 9.2* 9.4* 9.5* 9.0*  HCT 31.4* 32.1* 31.6* 30.5*  MCV 79.5 78.3 78.0 77.8*  PLT 333 328 315 296   Blood Culture    Component Value Date/Time   SDES URINE, RANDOM 10/07/2016 0907   SPECREQUEST NONE 10/07/2016 0907   CULT NO GROWTH 10/07/2016 0907   REPTSTATUS 10/08/2016 FINAL 10/07/2016 0907    Cardiac Enzymes: Recent Labs  Lab 09/19/17 1135  TROPONINI 0.03*   CBG: Recent Labs  Lab 09/21/17 0503 09/21/17 2106 09/22/17 0604 09/22/17 1806 09/23/17 0837  GLUCAP 110* 118* 93 122* 114*   Iron Studies: No results for input(s): IRON, TIBC, TRANSFERRIN, FERRITIN in the last 72 hours. Lab Results  Component Value Date   INR 1.13 09/23/2017   INR 1.01 08/28/2016   Medications: .  ceFAZolin (ANCEF) IV     . cinacalcet  30 mg Oral Q breakfast  . cloNIDine  0.1 mg Oral BID  . [START ON 09/24/2017] darbepoetin (ARANESP) injection - DIALYSIS  200 mcg Intravenous Q Thu-HD  . feeding supplement (NEPRO CARB STEADY)  237 mL Oral TID BM  . heparin  5,000 Units Subcutaneous Q8H  . labetalol  200 mg Oral BID  . losartan  25 mg Oral Daily  . multivitamin  1 tablet Oral Daily  . mycophenolate  360 mg Oral BID  . pravastatin  80 mg Oral QHS  . predniSONE  5 mg Oral Q breakfast  . sevelamer carbonate  800 mg Oral TID WC  . tacrolimus  5 mg Oral BID

## 2017-09-23 NOTE — Transfer of Care (Signed)
Immediate Anesthesia Transfer of Care Note  Patient: Ryan [name]  Procedure(s) Performed: FISTULA PLICATION LEFT ARM (Left Arm Upper)  Patient Location: PACU  Anesthesia Type:MAC  Level of Consciousness: drowsy  Airway & Oxygen Therapy: Patient Spontanous Breathing  Post-op Assessment: Report given to RN and Post -op Vital signs reviewed and stable  Post vital signs: Reviewed and stable  Last Vitals:  Vitals Value Taken Time  BP 135/78 09/23/2017  6:40 PM  Temp 36.2 C 09/23/2017  6:39 PM  Pulse 77 09/23/2017  6:41 PM  Resp 16 09/23/2017  6:41 PM  SpO2 94 % 09/23/2017  6:41 PM  Vitals shown include unvalidated device data.  Last Pain:  Vitals:   09/23/17 1839  TempSrc:   PainSc: (P) 0-No pain         Complications: No apparent anesthesia complications

## 2017-09-23 NOTE — Progress Notes (Signed)
Called report to [phone].

## 2017-09-23 NOTE — Interval H&P Note (Signed)
History and Physical Interval Note:  09/23/2017 5:10 PM  Ryan [name]  has presented today for surgery, with the diagnosis of fistula pseudoaneurysm  The various methods of treatment have been discussed with the patient and family. After consideration of risks, benefits and other options for treatment, the patient has consented to  Procedure(s): FISTULA PLICATION LEFT ARM (Left) as a surgical intervention .  The patient's history has been reviewed, patient examined, no change in status, stable for surgery.  I have reviewed the patient's chart and labs.  Questions were answered to the patient's satisfaction.     Ruta Hinds

## 2017-09-23 NOTE — Progress Notes (Signed)
*  PRELIMINARY RESULTS* Vascular Ultrasound Duplex Dialysis Access (AVF, AGV) has been completed.   The left brachio-cephalic fistula is patent. There is a pseudoaneurysm of the left cephalic vein in the distal upper arm that measures 2.2 x 1.8 cm. There is an area of dilatation in the left cephalic vein in the left mid-distal upper arm, measuring from 1.03cm to 1.59cm, to 1.17cm. There are two branches of the left cephalic vein at the proximal upper arm. There is an area at the origin of the cephalic vein that exhibits elevated velocities without evidence of significant luminal narrowing by color Doppler.  09/23/2017 12:36 PM Maudry Mayhew, BS, RVT, RDCS, RDMS

## 2017-09-23 NOTE — Telephone Encounter (Signed)
Copied from [place] [id number]. Topic: Quick Communication - See Telephone Encounter >> Sep 23, 2017  1:45 PM Rutherford Nail, NT wrote: CRM for notification. See Telephone encounter for: 09/23/17. Patient calling and states that he has been in the hospital since Saturday. States they drained a lot of fluid off, over 8L. Just wanted to let Dr Rogers Blocker know that he is in the hospital. CB#: [phone]

## 2017-09-23 NOTE — Telephone Encounter (Signed)
See note

## 2017-09-23 NOTE — Op Note (Signed)
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 After adequate sedation, the entire left upper extremity was prepped and draped in usual sterile fashion. A longitudinal incision was made over the proximal area of aneurysmal degeneration. The fistula was dissected free circumferentially throughout its course. The patient was given 3000 units of intravenous heparin. The fistula was clamped proximally and distally above and below the aneurysm. The aneurysm was opened longitudinally and the redundant segment fistula excised. The area of resection was then reapproximated using a running 5-0 Prolene suture. The clamps were removed and flow restored to the fistula. Hemostasis was obtained with administration of 30 mg of protamine and one repair stitch at the anastomosis. Some of the redundant skin was then resected. The subcutaneous tissues were reapproximated using a running 3-0 Vicryl suture. The skin was closed with a 4 0 Vicryl subcuticular stitch. Dermabond was applied to the skin incision.  The patient tolerated procedure well and there were no complications. Instrument sponge and needle counts were correct at the end of the case. The patient was taken to the recovery room in stable condition.   Ruta Hinds, MD [facility] Office: [phone] Pager: [phone]

## 2017-09-23 NOTE — Discharge Instructions (Signed)
° °  [facility] ° °Discharge Instructions ° °AV Fistula or Graft Surgery for Dialysis Access ° °Please refer to the following instructions for your post-procedure care. Your surgeon or physician assistant will discuss any changes with you. ° °Activity ° °You may drive the day following your surgery, if you are comfortable and no longer taking prescription pain medication. Resume full activity as the soreness in your incision resolves. ° °Bathing/Showering ° °You may shower after you go home. Keep your incision dry for 48 hours. Do not soak in a bathtub, hot tub, or swim until the incision heals completely. You may not shower if you have a hemodialysis catheter. ° °Incision Care ° °Clean your incision with mild soap and water after 48 hours. Pat the area dry with a clean towel. You do not need a bandage unless otherwise instructed. Do not apply any ointments or creams to your incision. You may have skin glue on your incision. Do not peel it off. It will come off on its own in about one week. Your arm may swell a bit after surgery. To reduce swelling use pillows to elevate your arm so it is above your heart. Your doctor will tell you if you need to lightly wrap your arm with an ACE bandage. ° °Diet ° °Resume your normal diet. There are not special food restrictions following this procedure. In order to heal from your surgery, it is CRITICAL to get adequate nutrition. Your body requires vitamins, minerals, and protein. Vegetables are the best source of vitamins and minerals. Vegetables also provide the perfect balance of protein. Processed food has little nutritional value, so try to avoid this. ° °Medications ° °Resume taking all of your medications. If your incision is causing pain, you may take over-the counter pain relievers such as acetaminophen (Tylenol). If you were prescribed a stronger pain medication, please be aware these medications can cause nausea and constipation. Prevent  nausea by taking the medication with a snack or meal. Avoid constipation by drinking plenty of fluids and eating foods with high amount of fiber, such as fruits, vegetables, and grains. Do not take Tylenol if you are taking prescription pain medications. ° ° ° ° °Follow up °Your surgeon may want to see you in the office following your access surgery. If so, this will be arranged at the time of your surgery. ° °Please call us immediately for any of the following conditions: ° °Increased pain, redness, drainage (pus) from your incision site °Fever of 101 degrees or higher °Severe or worsening pain at your incision site °Hand pain or numbness. ° °Reduce your risk of vascular disease: ° °Stop smoking. If you would like help, call [facility] at 1-800-QUIT-NOW (1-800-784-8669) or [place] at 336-586-4000 ° °Manage your cholesterol °Maintain a desired weight °Control your diabetes °Keep your blood pressure down ° °Dialysis ° °It will take several weeks to several months for your new dialysis access to be ready for use. Your surgeon will determine when it is OK to use it. Your nephrologist will continue to direct your dialysis. You can continue to use your Permcath until your new access is ready for use. ° °If you have any questions, please call the office at [phone]. ° °

## 2017-09-23 NOTE — Anesthesia Preprocedure Evaluation (Addendum)
Anesthesia Evaluation  Patient identified by MRN, date of birth, ID band Patient awake    Reviewed: Allergy & Precautions, NPO status , Patient's Chart, lab work & pertinent test results  Airway Mallampati: I  TM Distance: >3 FB Neck ROM: Full    Dental no notable dental hx. (+) Teeth Intact   Pulmonary  Possible OSA (undiagnosed)    Pulmonary exam normal breath sounds clear to auscultation       Cardiovascular hypertension, Pt. on medications and Pt. on home beta blockers Normal cardiovascular exam Rhythm:Regular Rate:Normal  '19 TTE - Severe LVH. EF 55% to 60%. Grade 2 diastolic  dysfunction. Very mild AS, mild AI. Mildly dilated aortic root and ascending aorta. Mildly dilated LA. Moderately dilated RV with moderate systolic function reduction. Mildly dilated RA. A small pericardial effusion was identified.   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     Musculoskeletal   Abdominal Normal abdominal exam  (+)   Peds  Hematology  (+) anemia ,   Anesthesia Other Findings   Reproductive/Obstetrics                            Anesthesia Physical  Anesthesia Plan  ASA: III  Anesthesia Plan: MAC   Post-op Pain Management:    Induction: Intravenous  PONV Risk Score and Plan: 1 and Propofol infusion and Treatment may vary due to age or medical condition  Airway Management Planned: Natural Airway and Simple Face Mask  Additional Equipment: None  Intra-op Plan:   Post-operative Plan:   Informed Consent: I have reviewed the patients History and Physical, chart, labs and discussed the procedure including the risks, benefits and alternatives for the proposed anesthesia with the patient or authorized representative who has indicated his/her  understanding and acceptance.     Plan Discussed with: CRNA and Surgeon  Anesthesia Plan Comments:         Anesthesia Quick Evaluation

## 2017-09-24 LAB — PHOSPHORUS: Phosphorus: 5 mg/dL — ABNORMAL HIGH (ref 2.5–4.6)

## 2017-09-24 LAB — CBC WITH DIFFERENTIAL/PLATELET
BASOS ABS: 0.1 10*3/uL (ref 0.0–0.1)
Basophils Relative: 1 %
Eosinophils Absolute: 0.4 10*3/uL (ref 0.0–0.7)
Eosinophils Relative: 5 %
HCT: 30.4 % — ABNORMAL LOW (ref 39.0–52.0)
Hemoglobin: 9 g/dL — ABNORMAL LOW (ref 13.0–17.0)
LYMPHS ABS: 0.9 10*3/uL (ref 0.7–4.0)
Lymphocytes Relative: 10 %
MCH: 23.2 pg — ABNORMAL LOW (ref 26.0–34.0)
MCHC: 29.6 g/dL — ABNORMAL LOW (ref 30.0–36.0)
MCV: 78.4 fL (ref 78.0–100.0)
MONO ABS: 0.9 10*3/uL (ref 0.1–1.0)
Monocytes Relative: 10 %
NEUTROS PCT: 74 %
Neutro Abs: 6.6 10*3/uL (ref 1.7–7.7)
PLATELETS: 314 10*3/uL (ref 150–400)
RBC: 3.88 MIL/uL — AB (ref 4.22–5.81)
RDW: 23.1 % — AB (ref 11.5–15.5)
WBC: 8.9 10*3/uL (ref 4.0–10.5)

## 2017-09-24 LAB — COMPREHENSIVE METABOLIC PANEL
ALBUMIN: 2.5 g/dL — AB (ref 3.5–5.0)
ALT: 25 U/L (ref 0–44)
AST: 25 U/L (ref 15–41)
Alkaline Phosphatase: 91 U/L (ref 38–126)
Anion gap: 12 (ref 5–15)
BUN: 47 mg/dL — AB (ref 6–20)
CHLORIDE: 97 mmol/L — AB (ref 98–111)
CO2: 28 mmol/L (ref 22–32)
Calcium: 7.5 mg/dL — ABNORMAL LOW (ref 8.9–10.3)
Creatinine, Ser: 6.31 mg/dL — ABNORMAL HIGH (ref 0.61–1.24)
GFR calc Af Amer: 10 mL/min — ABNORMAL LOW (ref >60)
GFR calc non Af Amer: 9 mL/min — ABNORMAL LOW (ref >60)
GLUCOSE: 100 mg/dL — AB (ref 70–99)
Potassium: 4 mmol/L (ref 3.5–5.1)
Sodium: 137 mmol/L (ref 135–145)
Total Bilirubin: 0.7 mg/dL (ref 0.3–1.2)
Total Protein: 5.7 g/dL — ABNORMAL LOW (ref 6.5–8.1)

## 2017-09-24 LAB — MAGNESIUM: Magnesium: 2.4 mg/dL (ref 1.7–2.4)

## 2017-09-24 LAB — GLUCOSE, CAPILLARY: Glucose-Capillary: 103 mg/dL — ABNORMAL HIGH (ref 70–99)

## 2017-09-24 MED ORDER — LIDOCAINE-PRILOCAINE 2.5-2.5 % EX CREA: 1.0000 "application " | TOPICAL_CREAM | CUTANEOUS | Status: DC | PRN

## 2017-09-24 MED ORDER — SODIUM CHLORIDE 0.9 % IV SOLN: 100.0000 mL | INTRAVENOUS | Status: DC | PRN

## 2017-09-24 MED ORDER — LABETALOL HCL 200 MG PO TABS: 200.0000 mg | ORAL_TABLET | Freq: Two times a day (BID) | ORAL | 0 refills | Status: DC

## 2017-09-24 MED ORDER — LIDOCAINE HCL (PF) 1 % IJ SOLN: 5.0000 mL | INTRAMUSCULAR | Status: DC | PRN

## 2017-09-24 MED ORDER — CLONIDINE HCL 0.1 MG PO TABS: 0.1000 mg | ORAL_TABLET | Freq: Two times a day (BID) | ORAL | 11 refills | Status: DC

## 2017-09-24 MED ORDER — PENTAFLUOROPROP-TETRAFLUOROETH EX AERO: 1.0000 "application " | INHALATION_SPRAY | CUTANEOUS | Status: DC | PRN

## 2017-09-24 MED ORDER — SEVELAMER CARBONATE 800 MG PO TABS: 800.0000 mg | ORAL_TABLET | Freq: Three times a day (TID) | ORAL | 0 refills | Status: DC

## 2017-09-24 MED ORDER — GUAIFENESIN ER 600 MG PO TB12: 1200.0000 mg | ORAL_TABLET | Freq: Two times a day (BID) | ORAL | 0 refills | Status: DC

## 2017-09-24 MED ORDER — DARBEPOETIN ALFA 200 MCG/0.4ML IJ SOSY
PREFILLED_SYRINGE | INTRAMUSCULAR | Status: AC
  Filled 2017-09-24: qty 0.4

## 2017-09-24 MED ORDER — HEPARIN SODIUM (PORCINE) 1000 UNIT/ML DIALYSIS: 1000.0000 [IU] | INTRAMUSCULAR | Status: DC | PRN

## 2017-09-24 NOTE — Progress Notes (Signed)
[facility] Progress Note   Subjective:  S/p plication L AVF yesterday Pain controlled, slept well. No complaints this morning   Objective Vitals:   09/23/17 2130 09/24/17 0505 09/24/17 0510 09/24/17 0824  BP: 140/88 129/77 102/72 (!) 143/84  Pulse: 75 80 76 83  Resp: 19 19 18 16   Temp: 97.6 F (36.4 C) 98.2 F (36.8 C) (!) 97.5 F (36.4 C) 98.2 F (36.8 C)  TempSrc: Oral Oral Oral Oral  SpO2: 96% 98% 99% 96%  Weight:   75.6 kg (166 lb 10.7 oz)   Height:       Physical Exam General: WNWD male lying in bed  Heart: RRR  Lungs: CTAB  Abdomen: soft NT  Extremities: Trace LE edema  Dialysis Access: LUE AVF +bruit s/p plication, mild swelling around incision  Dialysis Orders:  NW TTS  4h  78kg  2/2 bath LFA AVF  Heparin none  - mircera 225 last 6/20  - no other meds  Assessment/Plan: 1.  Volume overload/respiratory distress/pulmonary edema:  Had serial HD x 3 with significant volume unloading with good clinical/symptomatic improvement of pulmonary edema.  We discussed the importance of continued stringently with restriction of interdialytic weight gain and sodium intake. Well below EDW. Post HD weight  On 7/2 72kg. Plan to lower at discharge  2. ESRD: Usually TTS schedule-now back on his usual outpatient schedule with improved volume status. For HD today.  3. 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 Oneida Alar. Continue to cannulate above plication. Can use in 1 months  4. Anemia: Hemoglobin/hematocrit remain low, continue ESA. Aranesp 200 scheduled for 7/4  5. MBD: Ca low, trending down- hold Sensipar for now. Corr Ca ok.  Continue on current phosphorus binders 6. Nutrition: Continue renal multivitamin/renal diet/prot supplements  7. Hypertension: Blood pressure better with HD, monitor with ultrafiltration at hemodialysis. Continue titrate volume down as tolerated 8. Hx renal transplant    Lynnda Child PA-C [facility] Pager  [phone] 09/24/2017,9:23 AM  LOS: 4 days   Additional Objective Labs: Basic Metabolic Panel: Recent Labs  Lab 09/21/17 0800 09/22/17 0838 09/23/17 0428 09/24/17 0326  NA 137 137 135 137  K 4.2 4.9 3.8 4.0  CL 96* 96* 97* 97*  CO2 25 28 29 28   GLUCOSE 122* 104* 104* 100*  BUN 34* 26* 28* 47*  CREATININE 5.84* 4.63* 4.23* 6.31*  CALCIUM 8.3* 7.6* 7.8* 7.5*  PHOS 4.2 4.2  --  5.0*   CBC: Recent Labs  Lab 09/19/17 1135 09/20/17 0430 09/21/17 0800 09/23/17 0428 09/24/17 0326  WBC 7.6 7.3 8.5 7.5 8.9  NEUTROABS  --   --   --   --  6.6  HGB 9.2* 9.4* 9.5* 9.0* 9.0*  HCT 31.4* 32.1* 31.6* 30.5* 30.4*  MCV 79.5 78.3 78.0 77.8* 78.4  PLT 333 328 315 296 314   Blood Culture    Component Value Date/Time   SDES URINE, RANDOM 10/07/2016 0907   SPECREQUEST NONE 10/07/2016 0907   CULT NO GROWTH 10/07/2016 0907   REPTSTATUS 10/08/2016 FINAL 10/07/2016 0907    Cardiac Enzymes: Recent Labs  Lab 09/19/17 1135  TROPONINI 0.03*   CBG: Recent Labs  Lab 09/21/17 0503 09/21/17 2106 09/22/17 0604 09/22/17 1806 09/23/17 0837  GLUCAP 110* 118* 93 122* 114*   Iron Studies: No results for input(s): IRON, TIBC, TRANSFERRIN, FERRITIN in the last 72 hours. Lab Results  Component Value Date   INR 1.13 09/23/2017   INR 1.01 08/28/2016  Medications:  . Chlorhexidine Gluconate Cloth  6 each Topical Q0600  . cinacalcet  30 mg Oral Q breakfast  . cloNIDine  0.1 mg Oral BID  . darbepoetin (ARANESP) injection - DIALYSIS  200 mcg Intravenous Q Thu-HD  . feeding supplement (NEPRO CARB STEADY)  237 mL Oral TID BM  . guaiFENesin  1,200 mg Oral BID  . heparin  5,000 Units Subcutaneous Q8H  . labetalol  200 mg Oral BID  . losartan  25 mg Oral Daily  . multivitamin  1 tablet Oral Daily  . mycophenolate  360 mg Oral BID  . pravastatin  80 mg Oral QHS  . predniSONE  5 mg Oral Q breakfast  . sevelamer carbonate  800 mg Oral TID WC  . tacrolimus  5 mg Oral BID

## 2017-09-24 NOTE — Anesthesia Postprocedure Evaluation (Signed)
Anesthesia Post Note  Patient: Ryan [name]  Procedure(s) Performed: FISTULA PLICATION LEFT ARM (Left Arm Upper)     Patient location during evaluation: PACU Anesthesia Type: MAC Level of consciousness: awake and alert Pain management: pain level controlled Vital Signs Assessment: post-procedure vital signs reviewed and stable Respiratory status: spontaneous breathing, nonlabored ventilation and respiratory function stable Cardiovascular status: stable and blood pressure returned to baseline Anesthetic complications: no    Last Vitals:  Vitals:   09/24/17 0510 09/24/17 0824  BP: 102/72 (!) 143/84  Pulse: 76 83  Resp: 18 16  Temp: (!) 36.4 C 36.8 C  SpO2: 99% 96%    Last Pain:  Vitals:   09/24/17 0824  TempSrc: Oral  PainSc:                  [name]

## 2017-09-24 NOTE — Progress Notes (Signed)
SATURATION QUALIFICATIONS: (This note is used to comply with regulatory documentation for home oxygen)  Patient Saturations on Room Air at Rest = 99 %  Patient Saturations on Room Air while Ambulating = 97%  Patient Saturations on  N/A Liters of oxygen while Ambulating = N/A %  Please briefly explain why patient needs home oxygen:

## 2017-09-24 NOTE — Discharge Summary (Addendum)
Physician Discharge Summary  Ryan [name] HEN:[id number] DOB: 07/30/1960 DOA: 09/19/2017  PCP: Orma Flaming, MD  Admit date: 09/19/2017 Discharge date: 09/25/2017  Admitted From: Home Disposition: Home  Recommendations for Outpatient Follow-up:  1. Follow up with PCP in 1-2 weeks 2. Follow-up with nephrology Dr. Jamal Maes within 1 to 2 weeks 3. Follow-up with cardiology Dr. Glenetta Hew within 1 to 2 weeks for completion of outpatient Amyloid Workup and outpatient cardiac nuclear scan 4. Follow-up with Vascular Surgery within 1 week 5. Please obtain CMP/CBC, Mag, Phos in one week 6. Please follow up on the following pending results:  Home Health: No Equipment/Devices: None  Discharge Condition: Stable  CODE STATUS: FULL CODE Diet recommendation: Renal Diet 1200 mL Fluid Restriction  ADDENDUM: Patient was deemed medically stable to be discharged yesterday 09/24/2017 and was to be discharged after dialysis however did not come back from dialysis until late and was not able to secure a ride home.  Patient remained stable overnight and had no complaints or concerns.  Blood pressure was elevated slightly this morning however he had not gotten his medications and has improved after he got his medications.  He is deemed medically stable to be still discharged today and had no acute changes overnight and was seen and examined today with no issues.    Brief/Interim Summary: The patient is a 57 year old with past medical history relevant for ESRD status post failed renal transplant now on dialysis Tuesday/Thursday/Saturday, poorly controlled hypertension, hyperlipidemia and other comorbids who presents to the ED with severe shortness of breath after missing dialysis on the day of admission. Patient reported that for the past day he has been feeling quite poorly. He has been under significant amount of stress as he is currently homeless and has been living with various relatives who has had  interpersonal conflicts with. He was in an extended stay hotel and was getting ready to move into new housing. He went to sleep last night significant and worried for his sister and began to feel poorly. He called dialysis and reportedly did not feel well. He also began to notice shortness of breath. He reported that he has chronic lower extremity edema but it is worsened dramatically. EMS was called and he was noted to be hypoxic and in mild respiratory distress. He was given breathing treatments and supplemental oxygen and was transferred to the ED. He was admitted for acute respiratory failure with hypoxia with volume removal from missing dialysis.  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   Is evaluated by vascular surgery this morning and Dr. Donnetta Hutching felt that his upper arm incision was healing nicely and had an excellent thrill.  He recommended continue to use a fistula access above the aneurysm plication site and then use that site as well in 1 month.  Patient was deemed medically stable to be discharged today and will be discharged home after dialysis at the recommendation of Dr. Posey Pronto.  He will need to follow-up with his primary care physician as well as his primary nephrologist Dr. Jamal Maes in outpatient setting.  Discharge Diagnoses:  Active Problems:   Hypertension   ESRD (end stage renal disease) ([place])   Anemia of chronic disease   Pleural effusion   Hyperlipidemia   Shortness of breath   Acute respiratory distress   Acute pulmonary edema ([place])  Acute Hypoxic Respiratory Failurein the setting of  pulmonary edema with volume overload due to missing dialysis. 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 Donnetta Hutching evaluated the patient and stated left arm incision is healing nicely and recommending continued use of fistula axis above the aneurysm plication site and then used to site as well in 1 month  ESRD with Hx of Failed Renal Transplant due to inability of patient to obtain Anti-Rejection medications -Has HD on T/Th/Sat; Now BUN/Cr is 47/6.31; Patient to be dialyzed today -Was dialyzed 4 days and now back on his normal schedule per nephrology given that his volume status is improved -Cinacalcet 30 mils p.o. daily with breakfast, Rena-Vite 1 tab p.o. daily, and Sevelamer 800 mg p.o. 3 times daily -Nephrology continuing to advise about restriction of his diet and sodium intake -Per Vascular Surgery there should be room to cannulate the fistula above and recommending using plication site after 1 month  Recurrent Right Sided Pleural Effusion -He is s/p Thoracentesis 08/28/17: Removed 1200 ml Pleural analysis basically unremarkable; culture results negative; This was done at [facility] -Repeat chest x-ray as an outpatient   Cardiomegaly: Recurrent pulmonary edema -Echo Done on 7/2 while he is in the hospital; showed EF of 55-60% with Grade 2 Diastolic Dysfunction along with Severe LVH and 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 -Cardiologyrecommended outpatient follow up with Dr Ellyn Hack for amyloid work up and outpatientcardiac nuclear scan.  H/o living donor  renal transplant in 2010: failed and back on Hemodialysis  -He remained to be on Prograf, Myfortic, and prednisone, he is followed by transplant nephrology at [facility] -He was recently evaluated by [facility] nephrology at [facility] when he was hospitalized from 6/6-6/10  Anemia of Chronic Disease -In the setting of ESRD -He will only have been stable around 9 -Current hemoglobin/hematocrit was 9.0/30.4 -Continue to monitor for signs and symptoms of bleeding -Repeat CBC as an outpatient   H/o Bloody drainage from penis, chronic, intermittent  H/o right ureter stent in 2017 -He is followed by [facility] -Currently not having any complaints  -Follow up with Urology as an outpatient   HTN -He was recently evaluated by cardiology Dr Ellyn Hack who plans to continue adjust his blood pressure meds. -Cardiology consulted here, meds adjusted and will continue to monitor -C/w Clonidine 0.1 mg p.o. twice daily, hydralazine 10 mg IV every 6 as needed for high blood pressure with systolic blood pressure greater than 267 or diastolic blood pressure greater than 100, continue with labetalol 200 mg p.o. twice daily, losartan 25 mg p.o. Daily -BP this AM was elevated but repeat was 134/82  HLD -C/w Pravastatin 80 mg po qHS  Hyperphosphatemia -Patient's phosphorus level was 5.0 this morning -Expect to be corrected in dialysis -Continue to monitor and will in outpatient setting.  Discharge Instructions Discharge Instructions    Call MD for:  difficulty breathing, headache or visual disturbances  Complete by:  As directed    Call MD for:  extreme fatigue   Complete by:  As directed    Call MD for:  hives   Complete by:  As directed    Call MD for:  persistant dizziness or light-headedness   Complete by:  As directed    Call MD for:  persistant nausea and vomiting   Complete by:  As directed    Call MD for:  redness, tenderness, or signs of infection (pain, swelling, redness,  odor or green/yellow discharge around incision site)   Complete by:  As directed    Call MD for:  severe uncontrolled pain   Complete by:  As directed    Call MD for:  temperature >100.4   Complete by:  As directed    Diet - low sodium heart healthy   Complete by:  As directed    RENAL Diet 1200 mL Fluid Restriction   Discharge instructions   Complete by:  As directed    Follow up with PCP, Cardiology, Nephrology in the outpatient setting.  Take All medication as prescribed.  If symptoms change or worsen please return the emergency room for evaluation.   Increase activity slowly   Complete by:  As directed      Allergies as of 09/25/2017      Reactions   Azithromycin Nausea And Vomiting, Other (See Comments)   Chest tightness      Medication List    STOP taking these medications   benzonatate 100 MG capsule Commonly known as:  TESSALON   RENAGEL 800 MG tablet Generic drug:  sevelamer Replaced by:  sevelamer carbonate 800 MG tablet   sevelamer 800 MG tablet Commonly known as:  RENAGEL     TAKE these medications   cinacalcet 30 MG tablet Commonly known as:  SENSIPAR Take 30 mg by mouth daily.   cloNIDine 0.1 MG tablet Commonly known as:  CATAPRES Take 1 tablet (0.1 mg total) by mouth 2 (two) times daily. What changed:    medication strength  when to take this   feeding supplement (NEPRO CARB STEADY) Liqd Take 237 mLs by mouth 3 (three) times daily between meals. What changed:    when to take this  additional instructions   furosemide 80 MG tablet Commonly known as:  LASIX Take 80 mg by mouth 2 (two) times daily.   guaiFENesin 600 MG 12 hr tablet Commonly known as:  MUCINEX Take 2 tablets (1,200 mg total) by mouth 2 (two) times daily. What changed:    how much to take  when to take this  reasons to take this   labetalol 200 MG tablet Commonly known as:  NORMODYNE Take 1 tablet (200 mg total) by mouth 2 (two) times daily. What changed:  when to  take this   loratadine 10 MG tablet Commonly known as:  CLARITIN Take 10 mg by mouth daily as needed for allergies.   losartan 25 MG tablet Commonly known as:  COZAAR TAKE 1 TABLET BY MOUTH EVERY DAY   multivitamin Tabs tablet Take 1 tablet by mouth daily.   mycophenolate 360 MG Tbec EC tablet Commonly known as:  MYFORTIC Take 360 mg by mouth 2 (two) times daily.   NIFEdipine 60 MG 24 hr tablet Commonly known as:  PROCARDIA XL/ADALAT-CC Take 60 mg by mouth 2 (two) times daily.   pravastatin 80 MG tablet Commonly known as:  PRAVACHOL TAKE 1 TABLET BY MOUTH EVERY DAY What changed:  how much to take  how to take this  when to take this   predniSONE 5 MG tablet Commonly known as:  DELTASONE Take 5 mg by mouth daily with breakfast.   sevelamer carbonate 800 MG tablet Commonly known as:  RENVELA Take 1 tablet (800 mg total) by mouth 3 (three) times daily with meals. Replaces:  RENAGEL 800 MG tablet   tacrolimus 5 MG capsule Commonly known as:  PROGRAF Take 5 mg by mouth 2 (two) times daily.      Follow-up Information    Leonie Man, MD Follow up.   Specialty:  Cardiology Why:  for amyloid workup and cardiac stress test Contact information: [place] [address] [phone]        Elam Dutch, MD Follow up in 3 week(s).   Specialties:  Vascular Surgery, Cardiology Why:  office will call the patient Contact information: [place] [address] [phone]        Orma Flaming, MD. Call.   Specialty:  Family Medicine Why:  Follow up within 1 week  Contact information: [place] [address] [phone]          Allergies  Allergen Reactions  . Azithromycin Nausea And Vomiting and Other (See Comments)    Chest tightness   Consultations:  Nephrology  Vascular Surgery  Cardiology  Procedures/Studies: X-ray Chest Pa And Lateral  Result Date:  09/20/2017 CLINICAL DATA:  Pulmonary edema. EXAM: CHEST - 2 VIEW COMPARISON:  09/19/2017 FINDINGS: Cardiomediastinal silhouette is enlarged. Mediastinal contours appear intact. No evidence of pneumothorax. There is persistent interstitial pulmonary edema. There is a moderate in size right pleural effusion with right lower lobe atelectasis. Osseous structures are without acute abnormality. Soft tissues are grossly normal. IMPRESSION: Enlarged cardiac silhouette with interstitial pulmonary edema. Moderate in size right pleural effusion with right lower lobe atelectasis. Electronically Signed   By: Fidela Salisbury M.D.   On: 09/20/2017 13:11   Dg Chest Port 1 View  Result Date: 09/19/2017 CLINICAL DATA:  Chest pain EXAM: PORTABLE CHEST 1 VIEW COMPARISON:  August 22, 2017 FINDINGS: No pneumothorax. Diffuse interstitial opacities. Focal infiltrate in the right base. Possible small right effusion. The cardiomediastinal silhouette is stable. No other acute abnormalities. IMPRESSION: 1. Cardiomegaly and mild interstitial opacities suggesting edema. 2. Focal opacity in the right base may represent a superimposed pneumonia versus a right pleural effusion with underlying atelectasis. Recommend clinical correlation and follow-up to resolution. Electronically Signed   By: Dorise Bullion III M.D   On: 09/19/2017 12:09   ECHOCARDIOGRAM ------------------------------------------------------------------- Study Conclusions  - Left ventricle: The cavity size was normal. Wall thickness was   increased in a pattern of severe LVH. Systolic function was   normal. The estimated ejection fraction was in the range of 55%   to 60%. Wall motion was normal; there were no regional wall   motion abnormalities. Features are consistent with a pseudonormal   left ventricular filling pattern, with concomitant abnormal   relaxation and increased filling pressure (grade 2 diastolic   dysfunction). Doppler parameters are consistent  with high   ventricular filling pressure. - Aortic valve: Valve mobility was restricted. There was very mild   stenosis. There was mild regurgitation. - Aortic root: The aortic root was mildly dilated. - Ascending aorta: The ascending aorta was mildly dilated. - Left atrium: The atrium was mildly dilated. - Right ventricle: The cavity size was moderately dilated. Systolic   function was  moderately reduced. - Right atrium: The atrium was mildly dilated. - Pericardium, extracardiac: A small pericardial effusion was   identified.  Impressions:  - Normal LV systolic function; severe LVH; myocardium with speckled   appearance; fixed right coronary cusp with very mild AS (mean   gradient 10 mmHg) and mild AI; mildly dilated aortic   root/ascending aorta; mild biatrial enlargement; moderate RVE   with moderate RV dysfunction; small pericardial effusion;   findings suggestive of amyloid; suggest cardiac MRI or Tc-PYP   scan to further assess.  Subjective: Seen and examined at bedside and was doing well.  Had no active complaints and denied any chest pain, shortness breath, nausea, vomiting.  No lightheadedness or dizziness and was feeling well.  Ready to go home.  Discharge Exam: Vitals:   09/25/17 0448 09/25/17 1003  BP: (!) 171/101 134/82  Pulse: 85 79  Resp: 16 16  Temp: 99 F (37.2 C) 98.2 F (36.8 C)  SpO2: 96% 99%   Vitals:   09/25/17 0110 09/25/17 0447 09/25/17 0448 09/25/17 1003  BP: (!) 189/102  (!) 171/101 134/82  Pulse: 99  85 79  Resp: 18  16 16   Temp: 99.3 F (37.4 C)  99 F (37.2 C) 98.2 F (36.8 C)  TempSrc: Oral  Oral Oral  SpO2: 98%  96% 99%  Weight:  72.8 kg (160 lb 7.9 oz)    Height:       General: Pt is alert, awake, not in acute distress Cardiovascular: RRR, S1/S2 +, no rubs, no gallops Respiratory: Diminished bilaterally, no wheezing, no rhonchi Abdominal: Soft, NT, ND, bowel sounds + Extremities: no edema, no cyanosis; Left Arm AVF with palpable  thrill and good bruit; Left Arm incision appeared C/D/I  The results of significant diagnostics from this hospitalization (including imaging, microbiology, ancillary and laboratory) are listed below for reference.    Microbiology: Recent Results (from the past 240 hour(s))  MRSA PCR Screening     Status: None   Collection Time: 09/19/17  5:31 PM  Result Value Ref Range Status   MRSA by PCR NEGATIVE NEGATIVE Final    Comment:        The GeneXpert MRSA Assay (FDA approved for NASAL specimens only), is one component of a comprehensive MRSA colonization surveillance program. It is not intended to diagnose MRSA infection nor to guide or monitor treatment for MRSA infections. Performed at [facility], [place] [address], [place], [place] [address]   Surgical pcr screen     Status: None   Collection Time: 09/23/17  7:37 AM  Result Value Ref Range Status   MRSA, PCR NEGATIVE NEGATIVE Final   Staphylococcus aureus NEGATIVE NEGATIVE Final    Comment: (NOTE) The Xpert SA Assay (FDA approved for NASAL specimens in patients 29 years of age and older), is one component of a comprehensive surveillance program. It is not intended to diagnose infection nor to guide or monitor treatment. Performed at [facility], [place] [address], [place], [place] [address]     Labs: BNP (last 3 results) Recent Labs    05/19/17 0446  BNP 5,885.0*   Basic Metabolic Panel: Recent Labs  Lab 09/20/17 0430 09/21/17 0800 09/22/17 0838 09/23/17 0428 09/24/17 0326  NA 137 137 137 135 137  K 4.4 4.2 4.9 3.8 4.0  CL 97* 96* 96* 97* 97*  CO2 27 25 28 29 28   GLUCOSE 142* 122* 104* 104* 100*  BUN 45* 34* 26* 28* 47*  CREATININE 7.00*  5.84* 4.63* 4.23* 6.31*  CALCIUM 8.0* 8.3* 7.6* 7.8* 7.5*  MG  --   --   --   --  2.4  PHOS  --  4.2 4.2  --  5.0*   Liver Function Tests: Recent Labs  Lab 09/21/17 0800 09/22/17 0838 09/24/17 0326  AST  --   --  25  ALT  --   --  25  ALKPHOS  --   --  91   BILITOT  --   --  0.7  PROT  --   --  5.7*  ALBUMIN 2.8* 2.6* 2.5*   No results for input(s): LIPASE, AMYLASE in the last 168 hours. No results for input(s): AMMONIA in the last 168 hours. CBC: Recent Labs  Lab 09/19/17 1135 09/20/17 0430 09/21/17 0800 09/23/17 0428 09/24/17 0326  WBC 7.6 7.3 8.5 7.5 8.9  NEUTROABS  --   --   --   --  6.6  HGB 9.2* 9.4* 9.5* 9.0* 9.0*  HCT 31.4* 32.1* 31.6* 30.5* 30.4*  MCV 79.5 78.3 78.0 77.8* 78.4  PLT 333 328 315 296 314   Cardiac Enzymes: Recent Labs  Lab 09/19/17 1135  TROPONINI 0.03*   BNP: Invalid input(s): POCBNP CBG: Recent Labs  Lab 09/22/17 0604 09/22/17 1806 09/23/17 0837 09/24/17 1703 09/25/17 0622  GLUCAP 93 122* 114* 103* 108*   D-Dimer No results for input(s): DDIMER in the last 72 hours. Hgb A1c No results for input(s): HGBA1C in the last 72 hours. Lipid Profile No results for input(s): CHOL, HDL, LDLCALC, TRIG, CHOLHDL, LDLDIRECT in the last 72 hours. Thyroid function studies No results for input(s): TSH, T4TOTAL, T3FREE, THYROIDAB in the last 72 hours.  Invalid input(s): FREET3 Anemia work up No results for input(s): VITAMINB12, FOLATE, FERRITIN, TIBC, IRON, RETICCTPCT in the last 72 hours. Urinalysis    Component Value Date/Time   COLORURINE RED (A) 07/12/2017 0940   APPEARANCEUR CLOUDY (A) 07/12/2017 0940   LABSPEC  07/12/2017 0940    TEST NOT REPORTED DUE TO COLOR INTERFERENCE OF URINE PIGMENT   PHURINE  07/12/2017 0940    TEST NOT REPORTED DUE TO COLOR INTERFERENCE OF URINE PIGMENT   GLUCOSEU (A) 07/12/2017 0940    TEST NOT REPORTED DUE TO COLOR INTERFERENCE OF URINE PIGMENT   HGBUR (A) 07/12/2017 0940    TEST NOT REPORTED DUE TO COLOR INTERFERENCE OF URINE PIGMENT   BILIRUBINUR (A) 07/12/2017 0940    TEST NOT REPORTED DUE TO COLOR INTERFERENCE OF URINE PIGMENT   KETONESUR (A) 07/12/2017 0940    TEST NOT REPORTED DUE TO COLOR INTERFERENCE OF URINE PIGMENT   PROTEINUR (A) 07/12/2017 0940     TEST NOT REPORTED DUE TO COLOR INTERFERENCE OF URINE PIGMENT   NITRITE (A) 07/12/2017 0940    TEST NOT REPORTED DUE TO COLOR INTERFERENCE OF URINE PIGMENT   LEUKOCYTESUR (A) 07/12/2017 0940    TEST NOT REPORTED DUE TO COLOR INTERFERENCE OF URINE PIGMENT   Sepsis Labs Invalid input(s): PROCALCITONIN,  WBC,  LACTICIDVEN Microbiology Recent Results (from the past 240 hour(s))  MRSA PCR Screening     Status: None   Collection Time: 09/19/17  5:31 PM  Result Value Ref Range Status   MRSA by PCR NEGATIVE NEGATIVE Final    Comment:        The GeneXpert MRSA Assay (FDA approved for NASAL specimens only), is one component of a comprehensive MRSA colonization surveillance program. It is not intended to diagnose MRSA infection nor to guide or monitor  treatment for MRSA infections. Performed at [facility], [place] [address] [place], [place] [address]   Surgical pcr screen     Status: None   Collection Time: 09/23/17  7:37 AM  Result Value Ref Range Status   MRSA, PCR NEGATIVE NEGATIVE Final   Staphylococcus aureus NEGATIVE NEGATIVE Final    Comment: (NOTE) The Xpert SA Assay (FDA approved for NASAL specimens in patients 50 years of age and older), is one component of a comprehensive surveillance program. It is not intended to diagnose infection nor to guide or monitor treatment. Performed at [facility], [place] [address] [place], [place] [address]    Time coordinating discharge: 35 minutes  SIGNED:  Kerney Elbe, DO [facility] 09/25/2017, 11:14 AM Pager [phone]  If 7PM-7AM, please contact night-coverage [email or web address] Password [email or web address]

## 2017-09-24 NOTE — Progress Notes (Signed)
HD tx initiated via 15G and 17G, trouble accessing venous aspect of access, arterial stuck x1, venous stuck 15Gx1, 16Gx1 both unsuccessfully and then 17Gx1 successfully, when the 15G was pulled there was a clot pulled w/ it. Pull/push/flush well, VSS w/ increased bp, will cont to monitor while on HD tx

## 2017-09-24 NOTE — Progress Notes (Signed)
Patient ID: Ryan [name], male   DOB: Feb 19, 1961, 57 y.o.   MRN: [id number] Comfortable this morning. Upper arm incision healing nicely.  Excellent thrill. We will continue to use fistula access above the aneurysm plication site and then can use this site as well in 1 month. Will not follow actively.  Please call if we can assist

## 2017-09-25 ENCOUNTER — Encounter (HOSPITAL_COMMUNITY): Payer: Self-pay | Admitting: Vascular Surgery

## 2017-09-25 ENCOUNTER — Telehealth: Payer: Self-pay | Admitting: Vascular Surgery

## 2017-09-25 LAB — GLUCOSE, CAPILLARY: Glucose-Capillary: 108 mg/dL — ABNORMAL HIGH (ref 70–99)

## 2017-09-25 NOTE — Progress Notes (Addendum)
Pt just returned from HD at this time. This RN informed pt about discharge orders for after HD. Pt stated that he would have to be discharged in the am because he does not have a ride. Made MD aware.  Eleanora Neighbor, RN

## 2017-09-25 NOTE — Telephone Encounter (Signed)
sch appt 10/22/17 3pm p/o MD

## 2017-09-25 NOTE — Progress Notes (Signed)
HD tx completed @ 0005 w/o problem, UF goal met, blood rinsed back, VSS w/ increased bp, report called to [place] Mount, RN

## 2017-09-25 NOTE — Progress Notes (Signed)
Patient discharged to Home with daughter After visit Summary reviewed. Patient capable of reverbalizing medications and follow up visits. No signs and symptoms of distress noted. Patient educated to return to the ED in the case of an emergency. Dillon Bjork RN

## 2017-09-26 DIAGNOSIS — N2581 Secondary hyperparathyroidism of renal origin: Secondary | ICD-10-CM | POA: Diagnosis not present

## 2017-09-26 DIAGNOSIS — N186 End stage renal disease: Secondary | ICD-10-CM | POA: Diagnosis not present

## 2017-09-26 DIAGNOSIS — D649 Anemia, unspecified: Secondary | ICD-10-CM | POA: Diagnosis not present

## 2017-09-26 DIAGNOSIS — E876 Hypokalemia: Secondary | ICD-10-CM | POA: Diagnosis not present

## 2017-09-28 ENCOUNTER — Telehealth: Payer: Self-pay | Admitting: *Deleted

## 2017-09-28 NOTE — Telephone Encounter (Signed)
Per chart review: Admit date: 09/19/2017 Discharge date: 09/25/2017  Admitted From: Home Disposition: Home  Recommendations for Outpatient Follow-up:  1. Follow up with PCP in 1-2 weeks 2. Follow-up with nephrology Dr. Jamal Maes within 1 to 2 weeks 3. Follow-up with cardiology Dr. Glenetta Hew within 1 to 2 weeks for completion of outpatient Amyloid Workup and outpatient cardiac nuclear scan 4. Follow-up with Vascular Surgery within 1 week 5. Please obtain CMP/CBC, Mag, Phos in one week 6. Please follow up on the following pending results:  Home Health: No Equipment/Devices: None  Discharge Condition: Stable  CODE STATUS: FULL CODE Diet recommendation: Renal Diet 1200 mL Fluid Restriction _________________________________________________________________________ Per telephone encounter: Transition Care Management Follow-up Telephone Call   /Date discharged? 09/25/17   How have you been since you were released from the hospital? "much better"   Do you understand why you were in the hospital? yes   Do you understand the discharge instructions? yes   Where were you discharged to? Home   Items Reviewed:  Medications reviewed: yes  Allergies reviewed: yes  Dietary changes reviewed: yes  Referrals reviewed: yes   Functional Questionnaire:   Activities of Daily Living (ADLs):   He states they are independent in the following: ambulation, bathing and hygiene, feeding, continence, grooming, toileting and dressing States they require assistance with the following: none   Any transportation issues/concerns?: yes   Any patient concerns? no   Confirmed importance and date/time of follow-up visits scheduled yes  Provider Appointment booked with Dr Rogers Blocker 10/05/17  Confirmed with patient if condition begins to worsen call PCP or go to the ER.  Patient was given the office number and encouraged to call back with question or concerns.  : yes

## 2017-09-29 DIAGNOSIS — N2581 Secondary hyperparathyroidism of renal origin: Secondary | ICD-10-CM | POA: Diagnosis not present

## 2017-09-29 DIAGNOSIS — E876 Hypokalemia: Secondary | ICD-10-CM | POA: Diagnosis not present

## 2017-09-29 DIAGNOSIS — N186 End stage renal disease: Secondary | ICD-10-CM | POA: Diagnosis not present

## 2017-09-29 DIAGNOSIS — D649 Anemia, unspecified: Secondary | ICD-10-CM | POA: Diagnosis not present

## 2017-09-30 ENCOUNTER — Encounter: Payer: Self-pay | Admitting: Hematology and Oncology

## 2017-10-01 DIAGNOSIS — N2581 Secondary hyperparathyroidism of renal origin: Secondary | ICD-10-CM | POA: Diagnosis not present

## 2017-10-01 DIAGNOSIS — N186 End stage renal disease: Secondary | ICD-10-CM | POA: Diagnosis not present

## 2017-10-01 DIAGNOSIS — D649 Anemia, unspecified: Secondary | ICD-10-CM | POA: Diagnosis not present

## 2017-10-01 DIAGNOSIS — E876 Hypokalemia: Secondary | ICD-10-CM | POA: Diagnosis not present

## 2017-10-03 DIAGNOSIS — D649 Anemia, unspecified: Secondary | ICD-10-CM | POA: Diagnosis not present

## 2017-10-03 DIAGNOSIS — N186 End stage renal disease: Secondary | ICD-10-CM | POA: Diagnosis not present

## 2017-10-03 DIAGNOSIS — E876 Hypokalemia: Secondary | ICD-10-CM | POA: Diagnosis not present

## 2017-10-03 DIAGNOSIS — N2581 Secondary hyperparathyroidism of renal origin: Secondary | ICD-10-CM | POA: Diagnosis not present

## 2017-10-05 ENCOUNTER — Ambulatory Visit (INDEPENDENT_AMBULATORY_CARE_PROVIDER_SITE_OTHER): Payer: Medicare Other | Admitting: Family Medicine

## 2017-10-05 ENCOUNTER — Encounter: Payer: Self-pay | Admitting: Family Medicine

## 2017-10-05 ENCOUNTER — Ambulatory Visit (INDEPENDENT_AMBULATORY_CARE_PROVIDER_SITE_OTHER): Payer: Medicare Other

## 2017-10-05 VITALS — BP 162/92 | HR 78 | Temp 97.9°F | Ht 69.0 in | Wt 171.0 lb

## 2017-10-05 DIAGNOSIS — I10 Essential (primary) hypertension: Secondary | ICD-10-CM | POA: Diagnosis not present

## 2017-10-05 DIAGNOSIS — J90 Pleural effusion, not elsewhere classified: Secondary | ICD-10-CM

## 2017-10-05 DIAGNOSIS — Z992 Dependence on renal dialysis: Secondary | ICD-10-CM

## 2017-10-05 DIAGNOSIS — J9601 Acute respiratory failure with hypoxia: Secondary | ICD-10-CM | POA: Diagnosis not present

## 2017-10-05 DIAGNOSIS — D638 Anemia in other chronic diseases classified elsewhere: Secondary | ICD-10-CM | POA: Diagnosis not present

## 2017-10-05 DIAGNOSIS — N186 End stage renal disease: Secondary | ICD-10-CM

## 2017-10-05 LAB — COMPREHENSIVE METABOLIC PANEL
ALK PHOS: 94 U/L (ref 39–117)
ALT: 15 U/L (ref 0–53)
AST: 19 U/L (ref 0–37)
Albumin: 3.5 g/dL (ref 3.5–5.2)
BUN: 54 mg/dL — ABNORMAL HIGH (ref 6–23)
CO2: 29 meq/L (ref 19–32)
Calcium: 8.2 mg/dL — ABNORMAL LOW (ref 8.4–10.5)
Chloride: 97 mEq/L (ref 96–112)
Creatinine, Ser: 7.7 mg/dL (ref 0.40–1.50)
GFR: 9.42 mL/min — AB (ref >60.00)
GLUCOSE: 119 mg/dL — AB (ref 70–99)
Potassium: 5.4 mEq/L — ABNORMAL HIGH (ref 3.5–5.1)
Sodium: 137 mEq/L (ref 135–145)
Total Bilirubin: 0.3 mg/dL (ref 0.2–1.2)
Total Protein: 6.7 g/dL (ref 6.0–8.3)

## 2017-10-05 LAB — CBC WITH DIFFERENTIAL/PLATELET
Basophils Absolute: 0.1 10*3/uL (ref 0.0–0.1)
Basophils Relative: 0.9 % (ref 0.0–3.0)
EOS PCT: 4.1 % (ref 0.0–5.0)
Eosinophils Absolute: 0.3 10*3/uL (ref 0.0–0.7)
HCT: 29.1 % — ABNORMAL LOW (ref 39.0–52.0)
Hemoglobin: 9 g/dL — ABNORMAL LOW (ref 13.0–17.0)
LYMPHS ABS: 0.9 10*3/uL (ref 0.7–4.0)
Lymphocytes Relative: 11 % — ABNORMAL LOW (ref 12.0–46.0)
MCHC: 30.9 g/dL (ref 30.0–36.0)
MCV: 76 fl — AB (ref 78.0–100.0)
MONOS PCT: 7.1 % (ref 3.0–12.0)
Monocytes Absolute: 0.6 10*3/uL (ref 0.1–1.0)
NEUTROS ABS: 6.5 10*3/uL (ref 1.4–7.7)
NEUTROS PCT: 76.9 % (ref 43.0–77.0)
PLATELETS: 301 10*3/uL (ref 150.0–400.0)
RBC: 3.83 Mil/uL — ABNORMAL LOW (ref 4.22–5.81)
RDW: 23.7 % — ABNORMAL HIGH (ref 11.5–15.5)
WBC: 8.4 10*3/uL (ref 4.0–10.5)

## 2017-10-05 LAB — MAGNESIUM: Magnesium: 2.5 mg/dL (ref 1.5–2.5)

## 2017-10-05 LAB — PHOSPHORUS: Phosphorus: 4.9 mg/dL — ABNORMAL HIGH (ref 2.3–4.6)

## 2017-10-05 MED ORDER — FUROSEMIDE 80 MG PO TABS: 80.0000 mg | ORAL_TABLET | Freq: Two times a day (BID) | ORAL | 1 refills | Status: DC

## 2017-10-05 NOTE — Progress Notes (Signed)
Patient: Ryan [name] MRN: [id number] DOB: 05/16/1960 PCP: Orma Flaming, MD     Subjective:  Chief Complaint  Patient presents with  . Hospitalization Follow-up    acute pulmonary edema    HPI: The patient is a 57 y.o. male who presents today for hospital follow up for multiple issues. He presented to ED on 09/19/17 for severe SOB after missing dialysis. He was found to be hypoxic and in respiratory distress when he called EMS. He was admitted for acute respiratory failure with hypoxia secondary to volume overload from missing dialysis.   1) acute hypoxic respiratory failure in setting of pulmonary edema secondary to volume overload from missing dialysis -put on oxygen and was weanted nad tolerated room air prior to being discharged. Had ambulatory home oxygen screen prior to discharge, but do not have results of this. Patient tells me he passed this.  Recommended repeat cxr outpatient.   2) left AV fistula psuedoanerusym s/p plication Had this repaired by Dr. Donnetta Hutching on 09/23/17. Seen and evaluated by Dr. Donnetta Hutching who stated that incision healing nicely. Can use site in one month.   3) ESRD -dialyzed 4 days then back to normal schedule. cinacalcet with  Breakfast, rena-vite and sevelamer TID. He tells me that his nephrologist stopped his myfortic and told him he didn't need to take this. He hasn't had an actual appointment with Dr. Lorrene Reid in over 2 years. He only sees her for dialysis.   4) ACD -repeat cbc today. hgb 9.0/30/4 at discharge. I already referred him to heme for possible outpatient iron transfusions.   5) HTN F/u with cardiology. Work up for amyloid.  6) hyperphosphotemia -repeat today.   Echo: normal LV systolic function. Severe LVH. myocardium speckled appearance; fixed coronary cusp with very mild AS and mild AI. Mildly dilated aortic root/ascending aorta. Moderate RVE with moderate RV dysfunction. Small pericardial effusion. Findings suggestive of amyloid.    Review of Systems  Constitutional: Positive for appetite change and fatigue.  Eyes: Positive for visual disturbance.  Respiratory: Negative for chest tightness and shortness of breath.   Cardiovascular: Positive for leg swelling. Negative for chest pain.  Gastrointestinal: Negative for abdominal pain, constipation, diarrhea, nausea and vomiting.  Genitourinary: Negative for discharge and dysuria.  Musculoskeletal: Negative for back pain and neck pain.  Skin: Negative.   Neurological: Negative for dizziness, weakness, numbness and headaches.  Psychiatric/Behavioral: The patient is nervous/anxious.     Allergies Patient is allergic to azithromycin.  Past Medical History Patient  has a past medical history of Allergy, Blood transfusion without reported diagnosis, Dialysis patient ([place]), ESRD (end stage renal disease) ([place]), Hyperlipidemia, and Hypertension.  Surgical History Patient  has a past surgical history that includes AV fistula placement (Left, 08/28/2016); A/V Fistulagram (Left, 12/15/2016); stent in kidneys; IR THORACENTESIS ASP PLEURAL SPACE W/IMG GUIDE (08/22/2017); and Fistula superficialization (Left, 09/23/2017).  Family History Pateint's family history includes Alcohol abuse in his maternal grandfather; Cancer in his father; Heart disease in his maternal grandmother; Heart disease (age of onset: 35) in his mother; Hypertension in his mother and sister; Kidney cancer in his father; Learning disabilities in his paternal grandmother; Mental illness in his paternal grandmother; Stroke in his mother and paternal grandfather.  Social History Patient  reports that he has never smoked. He has never used smokeless tobacco. He reports that he does not drink alcohol or use drugs.    Objective: Vitals:   10/05/17 0948  BP: (!) 162/92  Pulse: 78  Temp: 97.9 F (  36.6 C)  TempSrc: Oral  SpO2: 95%  Weight: 171 lb (77.6 kg)  Height: 5\' 9"  (1.753 m)    Body mass index is 25.25  kg/m.  Physical Exam  Constitutional: He is oriented to person, place, and time. He appears well-developed and well-nourished.  HENT:  Right Ear: External ear normal.  Left Ear: External ear normal.  Nose: Nose normal.  Mouth/Throat: Oropharynx is clear and moist.  Tm pearly with light reflex bilaterally   Neck: Normal range of motion. Neck supple.  Cardiovascular: Normal rate and regular rhythm.  Le edema 1+ bilaterally   Pulmonary/Chest: Effort normal. No respiratory distress. He has no wheezes. He has rales.  Very faint rales in RLL, much improved.   Abdominal: Soft. Bowel sounds are normal.  Neurological: He is alert and oriented to person, place, and time.  Skin:  Left fitula repair well healed. Thrill +  Vitals reviewed.  Cxr: not much change in right sided effusion compared to 09/20/17.    Assessment/plan: 1. Acute respiratory failure with hypoxia (HCC) Resolved. Secondary to volume overload from missing dialysis.   2. ESRD (end stage renal disease) on dialysis [facility]) Checking labs. Back on regular dialysis schedule. Still do not understand renal doctor situation. He states he has not seen dr. Lorrene Reid for over 2 years for an appointment, but she manages his dialysis. Recommended that he call and make appointment to see her in clinic and will call as well to see if what he is saying is true. He also never followed up with dr. Amalia Hailey for possible stent removal and admits he dropped the ball on this one. Again, he needs to call and get this done. Myfortic stopper per dr. Lorrene Reid (patient reported).  - CBC with Differential/Platelet - Comprehensive metabolic panel - Phosphorus - Magnesium  3. Pleural effusion on right Not much change on xray by my read. Official read pending. Large differential. ?HF, ?amyloid, ? Volume overload... I want him to see cardiology for follow up and see if they change treatment. If effusion worse or shortness of breath gets worse will send to pulmonology  whom he may need to see regardless.  - DG Chest 2 View; Future  4. Anemia of chronic disease Rechecking today. Goal >9.0. Already referred to heme to see if candidate for iron infusion candidate.   5. Essential hypertension Decently controlled in hospital with IV hydralazine and home meds. I do believe that CCB are contraindicated in amyloid, but will not change medication at this time until he sees cardiology to confirm this. Refilled his loop diuretic as well which will help with the amyloid if he truly has. Will stay course until f/u with cards.   6. Hyperphosphatemia Rechecking today        Return in about 6 months (around 04/07/2018).   Orma Flaming, MD [place]   10/05/2017

## 2017-10-05 NOTE — Patient Instructions (Signed)
-  make appointment with dr. Amalia Hailey at [facility] to possibly have stent removed.   F/u with hematologist and cardiologist as scheduled.

## 2017-10-06 DIAGNOSIS — N2581 Secondary hyperparathyroidism of renal origin: Secondary | ICD-10-CM | POA: Diagnosis not present

## 2017-10-06 DIAGNOSIS — N186 End stage renal disease: Secondary | ICD-10-CM | POA: Diagnosis not present

## 2017-10-06 DIAGNOSIS — E876 Hypokalemia: Secondary | ICD-10-CM | POA: Diagnosis not present

## 2017-10-06 DIAGNOSIS — D649 Anemia, unspecified: Secondary | ICD-10-CM | POA: Diagnosis not present

## 2017-10-07 ENCOUNTER — Ambulatory Visit: Payer: Medicare Other | Admitting: Vascular Surgery

## 2017-10-08 DIAGNOSIS — N186 End stage renal disease: Secondary | ICD-10-CM | POA: Diagnosis not present

## 2017-10-08 DIAGNOSIS — N2581 Secondary hyperparathyroidism of renal origin: Secondary | ICD-10-CM | POA: Diagnosis not present

## 2017-10-08 DIAGNOSIS — E876 Hypokalemia: Secondary | ICD-10-CM | POA: Diagnosis not present

## 2017-10-08 DIAGNOSIS — D649 Anemia, unspecified: Secondary | ICD-10-CM | POA: Diagnosis not present

## 2017-10-10 DIAGNOSIS — N186 End stage renal disease: Secondary | ICD-10-CM | POA: Diagnosis not present

## 2017-10-10 DIAGNOSIS — E876 Hypokalemia: Secondary | ICD-10-CM | POA: Diagnosis not present

## 2017-10-10 DIAGNOSIS — N2581 Secondary hyperparathyroidism of renal origin: Secondary | ICD-10-CM | POA: Diagnosis not present

## 2017-10-10 DIAGNOSIS — D649 Anemia, unspecified: Secondary | ICD-10-CM | POA: Diagnosis not present

## 2017-10-13 ENCOUNTER — Other Ambulatory Visit: Payer: Self-pay

## 2017-10-13 ENCOUNTER — Other Ambulatory Visit: Payer: Self-pay | Admitting: Family Medicine

## 2017-10-13 DIAGNOSIS — E876 Hypokalemia: Secondary | ICD-10-CM | POA: Diagnosis not present

## 2017-10-13 DIAGNOSIS — N2581 Secondary hyperparathyroidism of renal origin: Secondary | ICD-10-CM | POA: Diagnosis not present

## 2017-10-13 DIAGNOSIS — N186 End stage renal disease: Secondary | ICD-10-CM | POA: Diagnosis not present

## 2017-10-13 DIAGNOSIS — I10 Essential (primary) hypertension: Secondary | ICD-10-CM

## 2017-10-13 DIAGNOSIS — D649 Anemia, unspecified: Secondary | ICD-10-CM | POA: Diagnosis not present

## 2017-10-13 MED ORDER — LOSARTAN POTASSIUM 25 MG PO TABS: 25.0000 mg | ORAL_TABLET | Freq: Every day | ORAL | 3 refills | Status: DC

## 2017-10-15 DIAGNOSIS — D649 Anemia, unspecified: Secondary | ICD-10-CM | POA: Diagnosis not present

## 2017-10-15 DIAGNOSIS — N2581 Secondary hyperparathyroidism of renal origin: Secondary | ICD-10-CM | POA: Diagnosis not present

## 2017-10-15 DIAGNOSIS — E876 Hypokalemia: Secondary | ICD-10-CM | POA: Diagnosis not present

## 2017-10-15 DIAGNOSIS — N186 End stage renal disease: Secondary | ICD-10-CM | POA: Diagnosis not present

## 2017-10-17 DIAGNOSIS — D649 Anemia, unspecified: Secondary | ICD-10-CM | POA: Diagnosis not present

## 2017-10-17 DIAGNOSIS — E876 Hypokalemia: Secondary | ICD-10-CM | POA: Diagnosis not present

## 2017-10-17 DIAGNOSIS — N186 End stage renal disease: Secondary | ICD-10-CM | POA: Diagnosis not present

## 2017-10-17 DIAGNOSIS — N2581 Secondary hyperparathyroidism of renal origin: Secondary | ICD-10-CM | POA: Diagnosis not present

## 2017-10-18 ENCOUNTER — Encounter (HOSPITAL_COMMUNITY): Payer: Self-pay

## 2017-10-18 ENCOUNTER — Emergency Department (HOSPITAL_COMMUNITY): Payer: Medicare Other

## 2017-10-18 ENCOUNTER — Observation Stay (HOSPITAL_COMMUNITY)
Admission: EM | Admit: 2017-10-18 | Discharge: 2017-10-19 | Disposition: A | Discharge status: Home / Self Care | Payer: Medicare Other | Attending: Internal Medicine | Admitting: Internal Medicine

## 2017-10-18 ENCOUNTER — Other Ambulatory Visit: Payer: Self-pay

## 2017-10-18 DIAGNOSIS — I5033 Acute on chronic diastolic (congestive) heart failure: Secondary | ICD-10-CM | POA: Insufficient documentation

## 2017-10-18 DIAGNOSIS — Z940 Kidney transplant status: Secondary | ICD-10-CM | POA: Diagnosis not present

## 2017-10-18 DIAGNOSIS — N186 End stage renal disease: Secondary | ICD-10-CM

## 2017-10-18 DIAGNOSIS — Z881 Allergy status to other antibiotic agents status: Secondary | ICD-10-CM | POA: Insufficient documentation

## 2017-10-18 DIAGNOSIS — R0609 Other forms of dyspnea: Secondary | ICD-10-CM

## 2017-10-18 DIAGNOSIS — J90 Pleural effusion, not elsewhere classified: Secondary | ICD-10-CM | POA: Diagnosis not present

## 2017-10-18 DIAGNOSIS — R0602 Shortness of breath: Secondary | ICD-10-CM | POA: Insufficient documentation

## 2017-10-18 DIAGNOSIS — J189 Pneumonia, unspecified organism: Secondary | ICD-10-CM

## 2017-10-18 DIAGNOSIS — R5383 Other fatigue: Secondary | ICD-10-CM | POA: Diagnosis not present

## 2017-10-18 DIAGNOSIS — Z9889 Other specified postprocedural states: Secondary | ICD-10-CM | POA: Diagnosis not present

## 2017-10-18 DIAGNOSIS — J168 Pneumonia due to other specified infectious organisms: Secondary | ICD-10-CM | POA: Diagnosis not present

## 2017-10-18 DIAGNOSIS — D638 Anemia in other chronic diseases classified elsewhere: Secondary | ICD-10-CM | POA: Insufficient documentation

## 2017-10-18 DIAGNOSIS — I132 Hypertensive heart and chronic kidney disease with heart failure and with stage 5 chronic kidney disease, or end stage renal disease: Secondary | ICD-10-CM | POA: Diagnosis not present

## 2017-10-18 DIAGNOSIS — Z992 Dependence on renal dialysis: Secondary | ICD-10-CM | POA: Insufficient documentation

## 2017-10-18 DIAGNOSIS — Z823 Family history of stroke: Secondary | ICD-10-CM | POA: Insufficient documentation

## 2017-10-18 DIAGNOSIS — I10 Essential (primary) hypertension: Secondary | ICD-10-CM | POA: Diagnosis present

## 2017-10-18 DIAGNOSIS — Z79899 Other long term (current) drug therapy: Secondary | ICD-10-CM | POA: Insufficient documentation

## 2017-10-18 DIAGNOSIS — E785 Hyperlipidemia, unspecified: Secondary | ICD-10-CM | POA: Diagnosis not present

## 2017-10-18 DIAGNOSIS — I5032 Chronic diastolic (congestive) heart failure: Secondary | ICD-10-CM | POA: Diagnosis present

## 2017-10-18 DIAGNOSIS — J181 Lobar pneumonia, unspecified organism: Secondary | ICD-10-CM

## 2017-10-18 DIAGNOSIS — Z8249 Family history of ischemic heart disease and other diseases of the circulatory system: Secondary | ICD-10-CM | POA: Insufficient documentation

## 2017-10-18 LAB — COMPREHENSIVE METABOLIC PANEL
ALBUMIN: 3.1 g/dL — AB (ref 3.5–5.0)
ALK PHOS: 89 U/L (ref 38–126)
ALT: 18 U/L (ref 0–44)
ANION GAP: 19 — AB (ref 5–15)
AST: 21 U/L (ref 15–41)
BILIRUBIN TOTAL: 0.7 mg/dL (ref 0.3–1.2)
BUN: 50 mg/dL — AB (ref 6–20)
CALCIUM: 8.9 mg/dL (ref 8.9–10.3)
CO2: 26 mmol/L (ref 22–32)
Chloride: 94 mmol/L — ABNORMAL LOW (ref 98–111)
Creatinine, Ser: 6.96 mg/dL — ABNORMAL HIGH (ref 0.61–1.24)
GFR calc Af Amer: 9 mL/min — ABNORMAL LOW (ref >60)
GFR calc non Af Amer: 8 mL/min — ABNORMAL LOW (ref >60)
GLUCOSE: 123 mg/dL — AB (ref 70–99)
Potassium: 4.6 mmol/L (ref 3.5–5.1)
Sodium: 139 mmol/L (ref 135–145)
TOTAL PROTEIN: 7.7 g/dL (ref 6.5–8.1)

## 2017-10-18 LAB — LIPASE, BLOOD: Lipase: 27 U/L (ref 11–51)

## 2017-10-18 LAB — CBC WITH DIFFERENTIAL/PLATELET
BASOS ABS: 0 10*3/uL (ref 0.0–0.1)
BASOS PCT: 0 %
EOS ABS: 0 10*3/uL (ref 0.0–0.7)
Eosinophils Relative: 4 %
HCT: 30.7 % — ABNORMAL LOW (ref 39.0–52.0)
Hemoglobin: 9.3 g/dL — ABNORMAL LOW (ref 13.0–17.0)
Lymphocytes Relative: 12 %
Lymphs Abs: 1.1 10*3/uL (ref 0.7–4.0)
MCH: 22.9 pg — ABNORMAL LOW (ref 26.0–34.0)
MCHC: 30.3 g/dL (ref 30.0–36.0)
MCV: 75.4 fL — ABNORMAL LOW (ref 78.0–100.0)
MONOS PCT: 7 %
Monocytes Absolute: 0.6 10*3/uL (ref 0.1–1.0)
NEUTROS ABS: 7.1 10*3/uL (ref 1.7–7.7)
NEUTROS PCT: 78 %
NRBC: 0 /100{WBCs}
Platelets: 348 10*3/uL (ref 150–400)
RBC: 4.07 MIL/uL — ABNORMAL LOW (ref 4.22–5.81)
RDW: 21.4 % — ABNORMAL HIGH (ref 11.5–15.5)
WBC: 9.1 10*3/uL (ref 4.0–10.5)

## 2017-10-18 LAB — TROPONIN I: Troponin I: 0.04 ng/mL (ref <0.03)

## 2017-10-18 LAB — BRAIN NATRIURETIC PEPTIDE: B Natriuretic Peptide: 4265.5 pg/mL — ABNORMAL HIGH (ref 0.0–100.0)

## 2017-10-18 MED ORDER — SODIUM CHLORIDE 0.9% FLUSH
3.0000 mL | Freq: Two times a day (BID) | INTRAVENOUS | Status: DC
  Administered 2017-10-19 (×2): 3 mL via INTRAVENOUS

## 2017-10-18 MED ORDER — GUAIFENESIN-DM 100-10 MG/5ML PO SYRP: 10.0000 mL | ORAL_SOLUTION | Freq: Two times a day (BID) | ORAL | Status: DC | PRN

## 2017-10-18 MED ORDER — FUROSEMIDE 10 MG/ML IJ SOLN
40.0000 mg | Freq: Once | INTRAMUSCULAR | Status: AC
  Administered 2017-10-18: 40 mg via INTRAVENOUS
  Filled 2017-10-18: qty 4

## 2017-10-18 MED ORDER — FUROSEMIDE 40 MG PO TABS: 80.0000 mg | ORAL_TABLET | Freq: Two times a day (BID) | ORAL | Status: DC

## 2017-10-18 MED ORDER — SODIUM CHLORIDE 0.9% FLUSH: 3.0000 mL | INTRAVENOUS | Status: DC | PRN

## 2017-10-18 MED ORDER — NIFEDIPINE ER OSMOTIC RELEASE 60 MG PO TB24
60.0000 mg | ORAL_TABLET | Freq: Two times a day (BID) | ORAL | Status: DC
  Administered 2017-10-19 (×2): 60 mg via ORAL
  Filled 2017-10-18 (×3): qty 1

## 2017-10-18 MED ORDER — CEFEPIME HCL 2 G IJ SOLR
2.0000 g | Freq: Once | INTRAMUSCULAR | Status: AC
  Administered 2017-10-18: 2 g via INTRAVENOUS
  Filled 2017-10-18: qty 2

## 2017-10-18 MED ORDER — SEVELAMER CARBONATE 800 MG PO TABS
800.0000 mg | ORAL_TABLET | Freq: Three times a day (TID) | ORAL | Status: DC
  Administered 2017-10-19 (×2): 800 mg via ORAL
  Filled 2017-10-18 (×2): qty 1

## 2017-10-18 MED ORDER — HYDRALAZINE HCL 20 MG/ML IJ SOLN
5.0000 mg | Freq: Once | INTRAMUSCULAR | Status: AC
  Administered 2017-10-19: 5 mg via INTRAVENOUS
  Filled 2017-10-18: qty 1

## 2017-10-18 MED ORDER — RENA-VITE PO TABS
1.0000 | ORAL_TABLET | Freq: Every day | ORAL | Status: DC
  Administered 2017-10-19: 1 via ORAL
  Filled 2017-10-18: qty 1

## 2017-10-18 MED ORDER — LABETALOL HCL 200 MG PO TABS
200.0000 mg | ORAL_TABLET | Freq: Two times a day (BID) | ORAL | Status: DC
  Administered 2017-10-19 (×2): 200 mg via ORAL
  Filled 2017-10-18 (×2): qty 1

## 2017-10-18 MED ORDER — SODIUM CHLORIDE 0.9 % IV SOLN: 250.0000 mL | INTRAVENOUS | Status: DC | PRN

## 2017-10-18 MED ORDER — LOSARTAN POTASSIUM 25 MG PO TABS
25.0000 mg | ORAL_TABLET | Freq: Every day | ORAL | Status: DC
  Administered 2017-10-19: 25 mg via ORAL
  Filled 2017-10-18: qty 1

## 2017-10-18 MED ORDER — ACETAMINOPHEN 325 MG PO TABS
650.0000 mg | ORAL_TABLET | ORAL | Status: DC | PRN
  Administered 2017-10-19: 650 mg via ORAL
  Filled 2017-10-18: qty 2

## 2017-10-18 MED ORDER — CLONIDINE HCL 0.1 MG PO TABS: 0.1000 mg | ORAL_TABLET | Freq: Two times a day (BID) | ORAL | Status: DC

## 2017-10-18 MED ORDER — LORATADINE 10 MG PO TABS: 10.0000 mg | ORAL_TABLET | Freq: Every day | ORAL | Status: DC | PRN

## 2017-10-18 MED ORDER — PRAVASTATIN SODIUM 80 MG PO TABS
80.0000 mg | ORAL_TABLET | Freq: Every day | ORAL | Status: DC
  Administered 2017-10-19: 80 mg via ORAL
  Filled 2017-10-18 (×2): qty 1

## 2017-10-18 MED ORDER — LABETALOL HCL 200 MG PO TABS: 200.0000 mg | ORAL_TABLET | Freq: Two times a day (BID) | ORAL | Status: DC

## 2017-10-18 MED ORDER — GUAIFENESIN ER 600 MG PO TB12: 1200.0000 mg | ORAL_TABLET | Freq: Two times a day (BID) | ORAL | Status: DC | PRN

## 2017-10-18 MED ORDER — ONDANSETRON HCL 4 MG/2ML IJ SOLN: 4.0000 mg | Freq: Four times a day (QID) | INTRAMUSCULAR | Status: DC | PRN

## 2017-10-18 MED ORDER — VANCOMYCIN HCL 10 G IV SOLR
2000.0000 mg | Freq: Once | INTRAVENOUS | Status: AC
  Administered 2017-10-18: 2000 mg via INTRAVENOUS
  Filled 2017-10-18: qty 2000

## 2017-10-18 MED ORDER — HEPARIN SODIUM (PORCINE) 5000 UNIT/ML IJ SOLN: 5000.0000 [IU] | Freq: Three times a day (TID) | INTRAMUSCULAR | Status: DC

## 2017-10-18 MED ORDER — FUROSEMIDE 40 MG PO TABS
80.0000 mg | ORAL_TABLET | Freq: Two times a day (BID) | ORAL | Status: DC
  Administered 2017-10-19 (×2): 80 mg via ORAL
  Filled 2017-10-18 (×2): qty 2

## 2017-10-18 MED ORDER — TACROLIMUS 1 MG PO CAPS
5.0000 mg | ORAL_CAPSULE | Freq: Two times a day (BID) | ORAL | Status: DC
  Administered 2017-10-19 (×2): 5 mg via ORAL
  Filled 2017-10-18 (×2): qty 5

## 2017-10-18 MED ORDER — CLONIDINE HCL 0.1 MG PO TABS
0.1000 mg | ORAL_TABLET | Freq: Two times a day (BID) | ORAL | Status: DC
  Administered 2017-10-19 (×2): 0.1 mg via ORAL
  Filled 2017-10-18 (×2): qty 1

## 2017-10-18 MED ORDER — PREDNISONE 5 MG PO TABS
5.0000 mg | ORAL_TABLET | Freq: Every day | ORAL | Status: DC
  Administered 2017-10-19: 5 mg via ORAL
  Filled 2017-10-18: qty 1

## 2017-10-18 NOTE — ED Provider Notes (Signed)
[place] DEPT Provider Note   CSN: [id number] Arrival date & time: 10/18/17  1407     History   Chief Complaint Chief Complaint  Patient presents with  . Shortness of Breath    HPI Ryan [name] is a 57 y.o. male with PMH/o CKD (T, TH, Sat dialysis patient), Renal transplant (on Prograf), HLD, HTN who presents for evaluation of SOB and generalized weakness that began yesterday.  Patient reports that he "just feels weird and weak and tired all over."  He states that he felt like he had no energy and states that he got tired and fatigued with just walking to the restroom.  Patient states that he was also having some intermittent shortness of breath.  He does wake states that the shortness of breath was worse with exertion.  Patient states that he has had a cough has been ongoing for several weeks.  He states that first was productive but nauseous with clear phlegm.  He states he has been taking some cough medicine with minimal improvement.  He does report difficulty sleeping secondary to cough.  Patient reports that he went to dialysis yesterday.  He states that he had 3.4 L renal.  He states that his normal is getting 4 L drained off.  Patient reports that sometimes he gets short of breath right before he goes to dialysis so he decided to go to dialysis to see if that would help the symptoms.  Patient reports that last night after dialysis he was still having some shortness of breath.  Patient reports that today he was getting ready for church and just felt so tired and fatigued so he came to the emergency department instead.  Patient also reports he had 2 episodes of nonbloody, nonbilious emesis this morning.  He denies any abdominal pain.  Patient reports he has noted some swelling in his bilateral lower extremities.  He states that this is common for him and states that it will fluctuate with his dialysis.  Patient denies any fever, chest pain, abdominal  pain. Patient denies any history of COPD, Asthma.   The history is provided by the patient.    Past Medical History:  Diagnosis Date  . Allergy   . Blood transfusion without reported diagnosis   . Dialysis patient ([place])    Tues,thurs,sat  . ESRD (end stage renal disease) ([place])   . Hyperlipidemia   . Hypertension     Patient Active Problem List   Diagnosis Date Noted  . Acute on chronic diastolic CHF (congestive heart failure) ([place]) 10/18/2017  . Acute pulmonary edema (HCC)   . Hyperlipidemia 09/19/2017  . Shortness of breath 09/19/2017  . Acute respiratory distress 09/19/2017  . PND (paroxysmal nocturnal dyspnea) 09/14/2017  . DOE (dyspnea on exertion) 09/14/2017  . Chest pain with moderate risk for cardiac etiology 09/14/2017  . Recurrent right pleural effusion 08/28/2017  . ESRD on dialysis ([place]) 08/27/2017  . Bloody drainage from penis 08/27/2017  . Lesion of vertebra 07/13/2017  . Renal transplant, status post 08/26/2016  . Hypertension 08/26/2016  . Anemia of chronic disease 04/09/2016  . EKG abnormalities   . 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 A/V FISTULAGRAM Left 12/15/2016   Procedure: A/V Fistulagram - left;  Surgeon: Angelia Mould, MD;  Location: [facility];  Service: Cardiovascular;  Laterality: Left;  . AV FISTULA PLACEMENT Left 08/28/2016   Procedure: LEFT UPPER  ARM ARTERIOVENOUS (AV) FISTULA CREATION;  Surgeon: Angelia Mould, MD;  Location: [facility];  Service: Vascular;  Laterality: Left;  . FISTULA SUPERFICIALIZATION Left 09/23/2017   Procedure: FISTULA PLICATION LEFT ARM;  Surgeon: Elam Dutch, MD;  Location: [facility];  Service: Vascular;  Laterality: Left;  . IR THORACENTESIS ASP PLEURAL SPACE W/IMG GUIDE  08/22/2017   1.2 L -right-sided  . stent in kidneys     dec 2017        Home Medications    Prior to Admission medications   Medication Sig Start Date End  Date Taking? Authorizing Provider  cloNIDine (CATAPRES) 0.1 MG tablet Take 1 tablet (0.1 mg total) by mouth 2 (two) times daily. 09/24/17  Yes Sheikh, Omair Latif, DO  dextromethorphan-guaiFENesin (ROBITUSSIN-DM) 10-100 MG/5ML liquid Take 10 mLs by mouth every 12 (twelve) hours as needed for cough.   Yes [provider]  furosemide (LASIX) 80 MG tablet Take 1 tablet (80 mg total) by mouth 2 (two) times daily. 10/05/17  Yes Orma Flaming, MD  guaiFENesin (MUCINEX) 600 MG 12 hr tablet Take 2 tablets (1,200 mg total) by mouth 2 (two) times daily. Patient taking differently: Take 1,200 mg by mouth 2 (two) times daily as needed for cough.  09/24/17  Yes Sheikh, Omair Latif, DO  labetalol (NORMODYNE) 200 MG tablet Take 1 tablet (200 mg total) by mouth 2 (two) times daily. 09/24/17  Yes Sheikh, Omair Latif, DO  losartan (COZAAR) 25 MG tablet Take 1 tablet (25 mg total) by mouth daily. 10/13/17  Yes Orma Flaming, MD  multivitamin (RENA-VIT) TABS tablet Take 1 tablet by mouth daily.   Yes [provider]  NIFEdipine (PROCARDIA XL/ADALAT-CC) 60 MG 24 hr tablet Take 60 mg by mouth 2 (two) times daily.   Yes [provider]  pravastatin (PRAVACHOL) 80 MG tablet TAKE 1 TABLET BY MOUTH EVERY DAY Patient taking differently: TAKE 1 TABLET BY MOUTH EVERY DAY AT BEDTIME 09/08/17  Yes Inda Coke, PA  predniSONE (DELTASONE) 5 MG tablet Take 5 mg by mouth daily with breakfast.   Yes [provider]  sevelamer carbonate (RENVELA) 800 MG tablet Take 1 tablet (800 mg total) by mouth 3 (three) times daily with meals. 09/24/17  Yes Sheikh, Omair Latif, DO  tacrolimus (PROGRAF) 5 MG capsule Take 5 mg by mouth 2 (two) times daily.   Yes [provider]  loratadine (CLARITIN) 10 MG tablet Take 10 mg by mouth daily as needed for allergies.    [provider]    Family History Family History  Problem Relation Age of Onset  . Hypertension Mother   . Heart disease Mother 27         By his report, he thinks that she had heart attack.  . Stroke Mother   . Cancer Father   . Kidney cancer Father   . Hypertension Sister   . Heart disease Maternal Grandmother   . Alcohol abuse Maternal Grandfather   . Mental illness Paternal Grandmother   . Learning disabilities Paternal Grandmother        Alzheimer's   . Stroke Paternal Grandfather   . Colon cancer Neg Hx   . Colon polyps Neg Hx   . Esophageal cancer Neg Hx   . Rectal cancer Neg Hx   . Stomach cancer Neg Hx     Social History Social History   Tobacco Use  . Smoking status: Never Smoker  . Smokeless tobacco: Never Used  Substance Use Topics  .  Alcohol use: No  . Drug use: No     Allergies   Azithromycin   Review of Systems Review of Systems  Constitutional: Positive for fatigue. Negative for fever.  Respiratory: Positive for cough and shortness of breath.   Cardiovascular: Positive for leg swelling (bilateral). Negative for chest pain.  Gastrointestinal: Negative for abdominal pain, nausea and vomiting.  Neurological: Positive for weakness (generalized). Negative for headaches.  All other systems reviewed and are negative.    Physical Exam Updated Vital Signs BP (!) 175/96   Pulse 85   Temp 98 F (36.7 C) (Oral)   Resp (!) 21   Wt 77.3 kg (170 lb 8 oz)   SpO2 90%   BMI 25.18 kg/m   Physical Exam  Constitutional: He is oriented to person, place, and time. He appears well-developed and well-nourished.  HENT:  Head: Normocephalic and atraumatic.  Mouth/Throat: Oropharynx is clear and moist and mucous membranes are normal.  Eyes: Pupils are equal, round, and reactive to light. Conjunctivae, EOM and lids are normal.  Neck: Full passive range of motion without pain.  Cardiovascular: Normal rate, regular rhythm, normal heart sounds and normal pulses. Exam reveals no gallop and no friction rub.  No murmur heard. AV fistula noted to LUE with positive thrill   Pulmonary/Chest: Effort  normal. Tachypnea noted. He has rales.  No evidence of respiratory distress. Mild increased work of breathing. Able to speak in full sentences without any difficulty. Faint rales noted.   Abdominal: Soft. Normal appearance. There is no tenderness. There is no rigidity and no guarding.  Abdomen is soft, non-distended, non-tender. No rigidity, No guarding. No peritoneal signs.  Musculoskeletal: Normal range of motion.  Non-pitting edema noted to BLE. No overlying warmth, erythema, edema.   Neurological: He is alert and oriented to person, place, and time.  Follows commands, Moves all extremities  5/5 strength to BUE and BLE  Sensation intact throughout all major nerve distributions Normal strength Normal coordination  Skin: Skin is warm and dry. Capillary refill takes less than 2 seconds.  Psychiatric: He has a normal mood and affect. His speech is normal.  Nursing note and vitals reviewed.    ED Treatments / Results  Labs (all labs ordered are listed, but only abnormal results are displayed) Labs Reviewed  CBC WITH DIFFERENTIAL/PLATELET - Abnormal; Notable for the following components:      Result Value   RBC 4.07 (*)    Hemoglobin 9.3 (*)    HCT 30.7 (*)    MCV 75.4 (*)    MCH 22.9 (*)    RDW 21.4 (*)    All other components within normal limits  TROPONIN I - Abnormal; Notable for the following components:   Troponin I 0.04 (*)    All other components within normal limits  COMPREHENSIVE METABOLIC PANEL - Abnormal; Notable for the following components:   Chloride 94 (*)    Glucose, Bld 123 (*)    BUN 50 (*)    Creatinine, Ser 6.96 (*)    Albumin 3.1 (*)    GFR calc non Af Amer 8 (*)    GFR calc Af Amer 9 (*)    Anion gap 19 (*)    All other components within normal limits  BRAIN NATRIURETIC PEPTIDE - Abnormal; Notable for the following components:   B Natriuretic Peptide 4,265.5 (*)    All other components within normal limits  LIPASE, BLOOD    EKG EKG  Interpretation  Date/Time:  Sunday October 18 2017 14:38:05 EDT Ventricular Rate:  82 PR Interval:    QRS Duration: 100 QT Interval:  415 QTC Calculation: 485 R Axis:   103 Text Interpretation:  Sinus rhythm Nonspecific T wave abnormality No significant change since last tracing Confirmed by Lajean Saver [phone]) on 10/18/2017 3:28:52 PM Also confirmed by Lajean Saver [phone]), editor Philomena Doheny [phone])  on 10/18/2017 3:44:03 PM   Radiology Dg Chest 2 View  Result Date: 10/18/2017 CLINICAL DATA:  Shortness of breath for 2 days EXAM: CHEST - 2 VIEW COMPARISON:  10/05/2017 FINDINGS: Cardiac shadow is enlarged but stable. Increasing right basilar infiltrate with associated effusion is noted. Left lung remains clear. No bony abnormality is seen. IMPRESSION: Increasing right basilar infiltrate and right-sided effusion. Electronically Signed   By: Inez Catalina M.D.   On: 10/18/2017 15:36    Procedures Procedures (including critical care time)  Medications Ordered in ED Medications  vancomycin (VANCOCIN) 2,000 mg in sodium chloride 0.9 % 500 mL IVPB (has no administration in time range)  dextromethorphan-guaiFENesin (ROBITUSSIN-DM) 10-100 MG/5ML liquid 10 mL (has no administration in time range)  guaiFENesin (MUCINEX) 12 hr tablet 1,200 mg (has no administration in time range)  loratadine (CLARITIN) tablet 10 mg (has no administration in time range)  losartan (COZAAR) tablet 25 mg (has no administration in time range)  multivitamin (RENA-VIT) tablet 1 tablet (has no administration in time range)  predniSONE (DELTASONE) tablet 5 mg (has no administration in time range)  tacrolimus (PROGRAF) capsule 5 mg (has no administration in time range)  sevelamer carbonate (RENVELA) tablet 800 mg (has no administration in time range)  NIFEdipine (PROCARDIA XL/ADALAT-CC) 24 hr tablet 60 mg (has no administration in time range)  pravastatin (PRAVACHOL) tablet 80 mg (has no administration in time range)   furosemide (LASIX) tablet 80 mg (has no administration in time range)  labetalol (NORMODYNE) tablet 200 mg (has no administration in time range)  cloNIDine (CATAPRES) tablet 0.1 mg (has no administration in time range)  ceFEPIme (MAXIPIME) 2 g in sodium chloride 0.9 % 100 mL IVPB (0 g Intravenous Stopped 10/18/17 2001)  furosemide (LASIX) injection 40 mg (40 mg Intravenous Given 10/18/17 1859)     Initial Impression / Assessment and Plan / ED Course  I have reviewed the triage vital signs and the nursing notes.  Pertinent labs & imaging results that were available during my care of the patient were reviewed by me and considered in my medical decision making (see chart for details).     57 y.o. M with PMH/o CKD (T, TH, Sat dialysis patient), Renal transplant (on Prograf), HLD, HTN who presents for evaluation of SOB, fatigue, generalized weakness, cough. Received dialysis yesterday and had 3.4 L removed. Patient is afebrile, non-toxic appearing, sitting comfortably on examination table. Vital signs reviewed and stable.  Patient exhibits no evidence of respiratory distress.  He does have some crackles noted that are faintly in the bilateral lower lung fields.  Has had some nonpitting edema with no overlying warmth, erythema.  Abdomen is benign.  Check basic labs, chest x-ray, EKG.  Consider infectious etiology versus fluid overload status.  Low suspicion for ACS etiology but also consideration.  History/physical exam is not concerning for PE.  Trop elevated at 0.04. Review shows he has had previous elevations.  CMP shows BUN and creatinine elevated at 50 and 6.96.  Review of records show that he consistently hangs around 6 and 40 range.  Potassium is 4.6.  CBC shows no leukocytosis.  Hemoglobin  is 9.3, hematocrit 30.7.  Review of records show that she has had this previously.  BNP elevated at 4, 265.5.  Last one I see is in January 2018 that showed 2, 037.0.  Chest x-ray shows increasing right basilar  infiltrates and right-sided effusion.  Compared with chest x-ray on 10/05/2017, his effusion is bigger.  Review of records show that patient had a thoracentesis done on 08/31/2017 at [facility].  He reports that there is no plans for follow-up.  They did not know the cause of his pleural effusion.  Review of his records and it seen his primary care doctor on 10/05/2017.  They were considering amyloid versus vascular but had not come to a definitive completion.  Primary care doctor made mention of status of pleural effusion got worse, he needs to be evaluated by pulmonology.  Given questionable infiltrates and recent admission, will plan to cover for healthcare acquired pneumonia.  Given worsening pleural effusion and the fact the patient is symptomatic from pleural effusion, recommend admission.  Question if this is need for dialysis versus need for IR guided thoracentesis.  Discussed patient with Dr. Alcario Drought (hospitalist). Will admit.    Final Clinical Impressions(s) / ED Diagnoses   Final diagnoses:  Pleural effusion  Pneumonia of right lower lobe due to infectious organism [facility])  Shortness of breath    ED Discharge Orders    None       Desma Mcgregor 10/18/17 2039    Lajean Saver, MD 10/18/17 2201

## 2017-10-18 NOTE — Progress Notes (Signed)
Consults received from an ED provider for vancomycin and cefepime per pharmacy dosing.  The patient's profile has been reviewed for ht/wt/allergies/indication/available labs.   A one time order has been placed for vancomycin 2gm x 1 and cefepime 2gm x 1.   Further antibiotics/pharmacy consults should be ordered by admitting physician if indicated.                       Thank you, Doreene Eland, PharmD, BCPS.   10/18/2017 6:54 PM

## 2017-10-18 NOTE — ED Notes (Signed)
Report called [facility] 3e 21c  Called [facility]  Paper work complete

## 2017-10-18 NOTE — ED Triage Notes (Signed)
He c/o persistent shortness of breath since yesterday. He tells me he was dialyzed yesterday also. EKG performed at triage.

## 2017-10-18 NOTE — ED Notes (Addendum)
Called [facility] - spoke w anne ( nurse could not take report ) waiting call back

## 2017-10-18 NOTE — ED Notes (Signed)
ED TO INPATIENT HANDOFF REPORT  Name/Age/Gender Ryan [name] 57 y.o. male  Code Status    Code Status Orders  (From admission, onward)        Start     Ordered   10/18/17 2016  Full code  Continuous     10/18/17 2017    Code Status History    Date Active Date Inactive Code Status Order ID Comments User Context   09/19/2017 1549 09/25/2017 1626 Full Code [id number]  [name] ED   08/26/2016 0053 08/29/2016 0035 Full Code [id number]  [name], MD ED   03/03/2016 2053 03/05/2016 0646 Full Code [id number]  [name], DO ED      Home/SNF/Other Home  Chief Complaint shob/weakness  Level of Care/Admitting Diagnosis ED Disposition    ED Disposition Condition [place]: [facility] [[id number]]  Level of Care: Telemetry [5]  Diagnosis: Acute on chronic diastolic CHF (congestive heart failure) [facility]) [637858]  Admitting Physician: [name] [phone]  Attending Physician: [name] [[id number]]  PT Class (Do Not Modify): Observation [104]  PT Acc Code (Do Not Modify): Observation [10022]       Medical History Past Medical History:  Diagnosis Date  . Allergy   . Blood transfusion without reported diagnosis   . Dialysis patient ([place])    Tues,thurs,sat  . ESRD (end stage renal disease) ([place])   . Hyperlipidemia   . Hypertension     Allergies Allergies  Allergen Reactions  . Azithromycin Nausea And Vomiting and Other (See Comments)    Chest tightness    IV Location/Drains/Wounds Patient Lines/Drains/Airways Status   Active Line/Drains/Airways    Name:   Placement date:   Placement time:   Site:   Days:   Peripheral IV 08/22/17 Right Antecubital   08/22/17    2108    Antecubital   57   Peripheral IV 09/19/17 Right Hand   09/19/17    1049    Hand   29   Peripheral IV 10/18/17 Right Antecubital   10/18/17    1900    Antecubital   less than 1   Fistula / Graft Left Upper arm Arteriovenous fistula    08/28/16    1030    Upper arm   416   Airway   09/23/17    1736     25   Incision (Closed) 08/28/16 Arm Left   08/28/16    1042     416   Incision (Closed) 09/23/17 Arm Left   09/23/17    1751     25          Labs/Imaging Results for orders placed or performed during the hospital encounter of 10/18/17 (from the past 48 hour(s))  CBC with Differential     Status: Abnormal   Collection Time: 10/18/17  4:12 PM  Result Value Ref Range   WBC 9.1 4.0 - 10.5 K/uL   RBC 4.07 (L) 4.22 - 5.81 MIL/uL   Hemoglobin 9.3 (L) 13.0 - 17.0 g/dL   HCT 30.7 (L) 39.0 - 52.0 %   MCV 75.4 (L) 78.0 - 100.0 fL   MCH 22.9 (L) 26.0 - 34.0 pg   MCHC 30.3 30.0 - 36.0 g/dL   RDW 21.4 (H) 11.5 - 15.5 %   Platelets 348 150 - 400 K/uL   Neutrophils Relative % 78 %   Neutro Abs 7.1 1.7 - 7.7 K/uL   Lymphocytes  Relative 12 %   Lymphs Abs 1.1 0.7 - 4.0 K/uL   Monocytes Relative 7 %   Monocytes Absolute 0.6 0.1 - 1.0 K/uL   Eosinophils Relative 4 %   Eosinophils Absolute 0.0 0.0 - 0.7 K/uL   Basophils Relative 0 %   Basophils Absolute 0.0 0.0 - 0.1 K/uL   Smear Review MORPHOLOGY UNREMARKABLE    nRBC 0 0 /100 WBC    Comment: Performed at [facility], [place] [address] [place], [place] [address]  Troponin I     Status: Abnormal   Collection Time: 10/18/17  4:12 PM  Result Value Ref Range   Troponin I 0.04 (HH) <0.03 ng/mL    Comment: CRITICAL RESULT CALLED TO, READ BACK BY AND VERIFIED WITH: T.WISE RN AT 1716 ON 7/28/109 BY S.VANHOORNE Performed at [facility], [place] [address] [place], [place] [address]   Comprehensive metabolic panel     Status: Abnormal   Collection Time: 10/18/17  4:12 PM  Result Value Ref Range   Sodium 139 135 - 145 mmol/L   Potassium 4.6 3.5 - 5.1 mmol/L   Chloride 94 (L) 98 - 111 mmol/L   CO2 26 22 - 32 mmol/L   Glucose, Bld 123 (H) 70 - 99 mg/dL   BUN 50 (H) 6 - 20 mg/dL   Creatinine, Ser 6.96 (H) 0.61 - 1.24 mg/dL   Calcium 8.9 8.9 - 10.3  mg/dL   Total Protein 7.7 6.5 - 8.1 g/dL   Albumin 3.1 (L) 3.5 - 5.0 g/dL   AST 21 15 - 41 U/L   ALT 18 0 - 44 U/L   Alkaline Phosphatase 89 38 - 126 U/L   Total Bilirubin 0.7 0.3 - 1.2 mg/dL   GFR calc non Af Amer 8 (L) >60 mL/min   GFR calc Af Amer 9 (L) >60 mL/min    Comment: (NOTE) The eGFR has been calculated using the CKD EPI equation. This calculation has not been validated in all clinical situations. eGFR's persistently <60 mL/min signify possible Chronic Kidney Disease.    Anion gap 19 (H) 5 - 15    Comment: Performed at [facility], [place] [address] [place], [place] [address]  Lipase, blood     Status: None   Collection Time: 10/18/17  4:12 PM  Result Value Ref Range   Lipase 27 11 - 51 U/L    Comment: Performed at [facility], [place] [address] [place], [place] [address]  Brain natriuretic peptide     Status: Abnormal   Collection Time: 10/18/17  4:12 PM  Result Value Ref Range   B Natriuretic Peptide 4,265.5 (H) 0.0 - 100.0 pg/mL    Comment: Performed at [facility], [place] [address] [place], [place] [address]   Dg Chest 2 View  Result Date: 10/18/2017 CLINICAL DATA:  Shortness of breath for 2 days EXAM: CHEST - 2 VIEW COMPARISON:  10/05/2017 FINDINGS: Cardiac shadow is enlarged but stable. Increasing right basilar infiltrate with associated effusion is noted. Left lung remains clear. No bony abnormality is seen. IMPRESSION: Increasing right basilar infiltrate and right-sided effusion. Electronically Signed   By: Inez Catalina M.D.   On: 10/18/2017 15:36    Pending Labs Unresulted Labs (From admission, onward)   Start     Ordered   10/19/17 7026  Basic metabolic panel  Daily,   R     10/18/17 2017   10/18/17 2014  HIV antibody (Routine Testing)  Once,   R     10/18/17 2017      Vitals/Pain Today's Vitals   10/18/17 1655 10/18/17 1730 10/18/17 1918 10/18/17 1947  BP: (!) 160/89 (!) 175/96    Pulse:  85     Resp: 18 (!) 21    Temp:      TempSrc:      SpO2: 100% 90%    Weight:   171 lb (77.6 kg) 170 lb 8 oz (77.3 kg)  PainSc: 0-No pain       Isolation Precautions No active isolations  Medications Medications  vancomycin (VANCOCIN) 2,000 mg in sodium chloride 0.9 % 500 mL IVPB (has no administration in time range)  dextromethorphan-guaiFENesin (ROBITUSSIN-DM) 10-100 MG/5ML liquid 10 mL (has no administration in time range)  guaiFENesin (MUCINEX) 12 hr tablet 1,200 mg (has no administration in time range)  loratadine (CLARITIN) tablet 10 mg (has no administration in time range)  losartan (COZAAR) tablet 25 mg (has no administration in time range)  multivitamin (RENA-VIT) tablet 1 tablet (has no administration in time range)  predniSONE (DELTASONE) tablet 5 mg (has no administration in time range)  tacrolimus (PROGRAF) capsule 5 mg (has no administration in time range)  sevelamer carbonate (RENVELA) tablet 800 mg (has no administration in time range)  NIFEdipine (PROCARDIA XL/ADALAT-CC) 24 hr tablet 60 mg (has no administration in time range)  pravastatin (PRAVACHOL) tablet 80 mg (has no administration in time range)  furosemide (LASIX) tablet 80 mg (has no administration in time range)  labetalol (NORMODYNE) tablet 200 mg (has no administration in time range)  cloNIDine (CATAPRES) tablet 0.1 mg (has no administration in time range)  sodium chloride flush (NS) 0.9 % injection 3 mL (has no administration in time range)  sodium chloride flush (NS) 0.9 % injection 3 mL (has no administration in time range)  0.9 %  sodium chloride infusion (has no administration in time range)  acetaminophen (TYLENOL) tablet 650 mg (has no administration in time range)  ondansetron (ZOFRAN) injection 4 mg (has no administration in time range)  heparin injection 5,000 Units (has no administration in time range)  ceFEPIme (MAXIPIME) 2 g in sodium chloride 0.9 % 100 mL IVPB (0 g Intravenous Stopped 10/18/17 2001)   furosemide (LASIX) injection 40 mg (40 mg Intravenous Given 10/18/17 1859)    Mobility walks

## 2017-10-18 NOTE — H&P (Signed)
History and Physical    Ryan [name] VFI:[id number] DOB: 07-May-1960 DOA: 10/18/2017  PCP: Orma Flaming, MD  Patient coming from: Home  I have personally briefly reviewed patient's old medical records in [place]  Chief Complaint: SOB  HPI: Ryan [name] is a 57 y.o. 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  Dialysis is TTS.  HLD, HTN, CHF with grade 2 diastolic dysfunction preserved EF.  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  Patient recently had R pleural effusion drained on 6/10 at [facility].  Fluid analysis was basically negative.  Patient recently admitted to our service 6/29-7/5 with fluid overload due to missed dialysis.  Discharge wt is recorded as 72kg on 7/5.  Wt was 77.5kg on 7/15 at PCP follow up.  He states he was feeling okay at that time.  He presents to the ED today for SOB, generalized weakness.  Symptoms onset yesterday.  He states that he felt like he had no energy and states that he got tired and fatigued with just walking to the restroom.  Patient states that he was also having some intermittent shortness of breath.  He does wake states that the shortness of breath was worse with exertion.  Patient states that he has had a cough has been ongoing for several weeks.  He states that first was productive but nauseous with clear phlegm.  He states he has been taking some cough medicine with minimal improvement.  He does report difficulty sleeping secondary to cough.  Patient reports that he went to dialysis yesterday.  He states that he had 3.4 L removed (usually has about 4L off).  Patient reports that sometimes he gets short of breath right before he goes to dialysis so he decided to go to dialysis to see if that would help the symptoms.  Patient reports that last night after dialysis he was still having some shortness of breath.  Patient reports that today he was getting ready for  church and just felt so tired and fatigued so he came to the emergency department instead.  Patient also reports he had 2 episodes of nonbloody, nonbilious emesis this morning.  He denies any abdominal pain.  Patient reports he has noted some swelling in his bilateral lower extremities.  He states that this is common for him and states that it will fluctuate with his dialysis.  Patient denies any fever, chest pain, abdominal pain. Patient denies any history of COPD, Asthma.     ED Course: BNP 4265, trop 0.04.  No SIRS.  CXR shows R mod pleural effusion with infiltrate above that seems to be worse than earlier this month.  EDP gave cefepime / vanc.  Wt 77.6kg   Review of Systems: As per HPI otherwise 10 point review of systems negative.   Past Medical History:  Diagnosis Date  . Allergy   . Blood transfusion without reported diagnosis   . Dialysis patient ([place])    Tues,thurs,sat  . ESRD (end stage renal disease) ([place])   . Hyperlipidemia   . Hypertension     Past Surgical History:  Procedure Laterality Date  . A/V FISTULAGRAM Left 12/15/2016   Procedure: A/V Fistulagram - left;  Surgeon: Angelia Mould, MD;  Location: [facility];  Service: Cardiovascular;  Laterality: Left;  . AV FISTULA PLACEMENT Left 08/28/2016   Procedure: LEFT UPPER  ARM ARTERIOVENOUS (AV) FISTULA CREATION;  Surgeon: Angelia Mould, MD;  Location: [facility] OR;  Service: Vascular;  Laterality: Left;  . FISTULA SUPERFICIALIZATION Left 09/23/2017   Procedure: FISTULA PLICATION LEFT ARM;  Surgeon: Elam Dutch, MD;  Location: [facility] OR;  Service: Vascular;  Laterality: Left;  . IR THORACENTESIS ASP PLEURAL SPACE W/IMG GUIDE  08/22/2017   1.2 L -right-sided  . stent in kidneys     dec 2017     reports that he has never smoked. He has never used smokeless tobacco. He reports that he does not drink alcohol or use drugs.  Allergies  Allergen Reactions  . Azithromycin Nausea And Vomiting and Other  (See Comments)    Chest tightness    Family History  Problem Relation Age of Onset  . Hypertension Mother   . Heart disease Mother 32       By his report, he thinks that she had heart attack.  . Stroke Mother   . Cancer Father   . Kidney cancer Father   . Hypertension Sister   . Heart disease Maternal Grandmother   . Alcohol abuse Maternal Grandfather   . Mental illness Paternal Grandmother   . Learning disabilities Paternal Grandmother        Alzheimer's   . Stroke Paternal Grandfather   . Colon cancer Neg Hx   . Colon polyps Neg Hx   . Esophageal cancer Neg Hx   . Rectal cancer Neg Hx   . Stomach cancer Neg Hx      Prior to Admission medications   Medication Sig Start Date End Date Taking? Authorizing Provider  cloNIDine (CATAPRES) 0.1 MG tablet Take 1 tablet (0.1 mg total) by mouth 2 (two) times daily. 09/24/17  Yes Sheikh, Omair Latif, DO  dextromethorphan-guaiFENesin (ROBITUSSIN-DM) 10-100 MG/5ML liquid Take 10 mLs by mouth every 12 (twelve) hours as needed for cough.   Yes [provider]  furosemide (LASIX) 80 MG tablet Take 1 tablet (80 mg total) by mouth 2 (two) times daily. 10/05/17  Yes Orma Flaming, MD  guaiFENesin (MUCINEX) 600 MG 12 hr tablet Take 2 tablets (1,200 mg total) by mouth 2 (two) times daily. Patient taking differently: Take 1,200 mg by mouth 2 (two) times daily as needed for cough.  09/24/17  Yes Sheikh, Omair Latif, DO  labetalol (NORMODYNE) 200 MG tablet Take 1 tablet (200 mg total) by mouth 2 (two) times daily. 09/24/17  Yes Sheikh, Omair Latif, DO  losartan (COZAAR) 25 MG tablet Take 1 tablet (25 mg total) by mouth daily. 10/13/17  Yes Orma Flaming, MD  multivitamin (RENA-VIT) TABS tablet Take 1 tablet by mouth daily.   Yes [provider]  NIFEdipine (PROCARDIA XL/ADALAT-CC) 60 MG 24 hr tablet Take 60 mg by mouth 2 (two) times daily.   Yes [provider]  pravastatin (PRAVACHOL) 80 MG tablet TAKE 1 TABLET BY MOUTH EVERY  DAY Patient taking differently: TAKE 1 TABLET BY MOUTH EVERY DAY AT BEDTIME 09/08/17  Yes Inda Coke, PA  predniSONE (DELTASONE) 5 MG tablet Take 5 mg by mouth daily with breakfast.   Yes [provider]  sevelamer carbonate (RENVELA) 800 MG tablet Take 1 tablet (800 mg total) by mouth 3 (three) times daily with meals. 09/24/17  Yes Sheikh, Omair Latif, DO  tacrolimus (PROGRAF) 5 MG capsule Take 5 mg by mouth 2 (two) times daily.   Yes [provider]  loratadine (CLARITIN) 10 MG tablet Take 10 mg by mouth daily as needed for allergies.    [provider]    Physical  Exam: Vitals:   10/18/17 1655 10/18/17 1730 10/18/17 1918 10/18/17 1947  BP: (!) 160/89 (!) 175/96    Pulse:  85    Resp: 18 (!) 21    Temp:      TempSrc:      SpO2: 100% 90%    Weight:   77.6 kg (171 lb) 77.3 kg (170 lb 8 oz)    Constitutional: NAD, calm, comfortable Eyes: PERRL, lids and conjunctivae normal ENMT: Mucous membranes are moist. Posterior pharynx clear of any exudate or lesions.Normal dentition.  Neck: normal, supple, no masses, no thyromegaly Respiratory: Crackles bilaterally Cardiovascular: Regular rate and rhythm, no murmurs / rubs / gallops. Trace pitting edema BLE. 2+ pedal pulses. No carotid bruits.  Abdomen: no tenderness, no masses palpated. No hepatosplenomegaly. Bowel sounds positive.  Musculoskeletal: no clubbing / cyanosis. No joint deformity upper and lower extremities. Good ROM, no contractures. Normal muscle tone.  Skin: no rashes, lesions, ulcers. No induration Neurologic: CN 2-12 grossly intact. Sensation intact, DTR normal. Strength 5/5 in all 4.  Psychiatric: Normal judgment and insight. Alert and oriented x 3. Normal mood.    Labs on Admission: I have personally reviewed following labs and imaging studies  CBC: Recent Labs  Lab 10/18/17 1612  WBC 9.1  NEUTROABS 7.1  HGB 9.3*  HCT 30.7*  MCV 75.4*  PLT 536   Basic Metabolic Panel: Recent Labs   Lab 10/18/17 1612  NA 139  K 4.6  CL 94*  CO2 26  GLUCOSE 123*  BUN 50*  CREATININE 6.96*  CALCIUM 8.9   GFR: Estimated Creatinine Clearance: 11.9 mL/min (A) (by C-G formula based on SCr of 6.96 mg/dL (H)). Liver Function Tests: Recent Labs  Lab 10/18/17 1612  AST 21  ALT 18  ALKPHOS 89  BILITOT 0.7  PROT 7.7  ALBUMIN 3.1*   Recent Labs  Lab 10/18/17 1612  LIPASE 27   No results for input(s): AMMONIA in the last 168 hours. Coagulation Profile: No results for input(s): INR, PROTIME in the last 168 hours. Cardiac Enzymes: Recent Labs  Lab 10/18/17 1612  TROPONINI 0.04*   BNP (last 3 results) No results for input(s): PROBNP in the last 8760 hours. HbA1C: No results for input(s): HGBA1C in the last 72 hours. CBG: No results for input(s): GLUCAP in the last 168 hours. Lipid Profile: No results for input(s): CHOL, HDL, LDLCALC, TRIG, CHOLHDL, LDLDIRECT in the last 72 hours. Thyroid Function Tests: No results for input(s): TSH, T4TOTAL, FREET4, T3FREE, THYROIDAB in the last 72 hours. Anemia Panel: No results for input(s): VITAMINB12, FOLATE, FERRITIN, TIBC, IRON, RETICCTPCT in the last 72 hours. Urine analysis:    Component Value Date/Time   COLORURINE RED (A) 07/12/2017 0940   APPEARANCEUR CLOUDY (A) 07/12/2017 0940   LABSPEC  07/12/2017 0940    TEST NOT REPORTED DUE TO COLOR INTERFERENCE OF URINE PIGMENT   PHURINE  07/12/2017 0940    TEST NOT REPORTED DUE TO COLOR INTERFERENCE OF URINE PIGMENT   GLUCOSEU (A) 07/12/2017 0940    TEST NOT REPORTED DUE TO COLOR INTERFERENCE OF URINE PIGMENT   HGBUR (A) 07/12/2017 0940    TEST NOT REPORTED DUE TO COLOR INTERFERENCE OF URINE PIGMENT   BILIRUBINUR (A) 07/12/2017 0940    TEST NOT REPORTED DUE TO COLOR INTERFERENCE OF URINE PIGMENT   KETONESUR (A) 07/12/2017 0940    TEST NOT REPORTED DUE TO COLOR INTERFERENCE OF URINE PIGMENT   PROTEINUR (A) 07/12/2017 0940    TEST NOT REPORTED DUE TO  COLOR INTERFERENCE OF  URINE PIGMENT   NITRITE (A) 07/12/2017 0940    TEST NOT REPORTED DUE TO COLOR INTERFERENCE OF URINE PIGMENT   LEUKOCYTESUR (A) 07/12/2017 0940    TEST NOT REPORTED DUE TO COLOR INTERFERENCE OF URINE PIGMENT    Radiological Exams on Admission: Dg Chest 2 View  Result Date: 10/18/2017 CLINICAL DATA:  Shortness of breath for 2 days EXAM: CHEST - 2 VIEW COMPARISON:  10/05/2017 FINDINGS: Cardiac shadow is enlarged but stable. Increasing right basilar infiltrate with associated effusion is noted. Left lung remains clear. No bony abnormality is seen. IMPRESSION: Increasing right basilar infiltrate and right-sided effusion. Electronically Signed   By: Inez Catalina M.D.   On: 10/18/2017 15:36    EKG: Independently reviewed.  Assessment/Plan Principal Problem:   Acute on chronic diastolic CHF (congestive heart failure) (HCC) Active Problems:   Renal transplant, status post   Hypertension   DOE (dyspnea on exertion)   ESRD on dialysis ([place])   Recurrent right pleural effusion    1. Acute on chronic diastolic CHF - with recurrent R pleural effusion 1. Favored over PNA given no SIRS, hypertension, increased Wt 2. Will hold off on further ABx for the moment, unless he develops SIRS or other evidence of infection 3. CHF pathway 4. Call nephro in AM to see if they feel further dialysis is warranted 5. Appointment with cards on 8/1 for imaging to determine if he has cardiac amyloid. 2. Recurrent pleural effusion - 1. IR eval and drain in AM 3. ESRD - 1. Nephrology consult in AM as above 2. Cont prednisone and prograf 4. HTN - continue home BP meds  DVT prophylaxis: Heparin [place] Code Status: Full Family Communication: No family in room Disposition Plan: Home after admit Consults called: None, call nephro in AM Admission status: Place in obs   Willaim Mode, [place] [facility] [place] [facility] Pager [phone] Only works nights!  If 7AM-7PM, please contact the primary day team physician taking care  of patient  [email or web address] Password [facility]  10/18/2017, 8:17 PM

## 2017-10-18 NOTE — ED Notes (Signed)
ED TO INPATIENT HANDOFF REPORT  Name/Age/Gender Ryan [name] 57 y.o. male  Code Status    Code Status Orders  (From admission, onward)        Start     Ordered   10/18/17 2016  Full code  Continuous     10/18/17 2017    Code Status History    Date Active Date Inactive Code Status Order ID Comments User Context   09/19/2017 1549 09/25/2017 1626 Full Code [id number]  [name] ED   08/26/2016 0053 08/29/2016 0035 Full Code [id number]  [name], MD ED   03/03/2016 2053 03/05/2016 0646 Full Code [id number]  [name], DO ED      Home/SNF/Other Home  Chief Complaint shob/weakness  Level of Care/Admitting Diagnosis ED Disposition    ED Disposition Condition [place]: [place] [100100]  Level of Care: Telemetry [5]  Diagnosis: Acute on chronic diastolic CHF (congestive heart failure) [facility]) [637858]  Admitting Physician: [name] [phone]  Attending Physician: [name] [[id number]]  PT Class (Do Not Modify): Observation [104]  PT Acc Code (Do Not Modify): Observation [10022]       Medical History Past Medical History:  Diagnosis Date  . Allergy   . Blood transfusion without reported diagnosis   . Dialysis patient ([place])    Tues,thurs,sat  . ESRD (end stage renal disease) ([place])   . Hyperlipidemia   . Hypertension     Allergies Allergies  Allergen Reactions  . Azithromycin Nausea And Vomiting and Other (See Comments)    Chest tightness    IV Location/Drains/Wounds Patient Lines/Drains/Airways Status   Active Line/Drains/Airways    Name:   Placement date:   Placement time:   Site:   Days:   Peripheral IV 08/22/17 Right Antecubital   08/22/17    2108    Antecubital   57   Peripheral IV 09/19/17 Right Hand   09/19/17    1049    Hand   29   Peripheral IV 10/18/17 Right Antecubital   10/18/17    1900    Antecubital   less than 1   Fistula / Graft Left Upper arm Arteriovenous fistula    08/28/16    1030    Upper arm   416   Airway   09/23/17    1736     25   Incision (Closed) 08/28/16 Arm Left   08/28/16    1042     416   Incision (Closed) 09/23/17 Arm Left   09/23/17    1751     25          Labs/Imaging Results for orders placed or performed during the hospital encounter of 10/18/17 (from the past 48 hour(s))  CBC with Differential     Status: Abnormal   Collection Time: 10/18/17  4:12 PM  Result Value Ref Range   WBC 9.1 4.0 - 10.5 K/uL   RBC 4.07 (L) 4.22 - 5.81 MIL/uL   Hemoglobin 9.3 (L) 13.0 - 17.0 g/dL   HCT 30.7 (L) 39.0 - 52.0 %   MCV 75.4 (L) 78.0 - 100.0 fL   MCH 22.9 (L) 26.0 - 34.0 pg   MCHC 30.3 30.0 - 36.0 g/dL   RDW 21.4 (H) 11.5 - 15.5 %   Platelets 348 150 - 400 K/uL   Neutrophils Relative % 78 %   Neutro Abs 7.1 1.7 - 7.7 K/uL   Lymphocytes  Relative 12 %   Lymphs Abs 1.1 0.7 - 4.0 K/uL   Monocytes Relative 7 %   Monocytes Absolute 0.6 0.1 - 1.0 K/uL   Eosinophils Relative 4 %   Eosinophils Absolute 0.0 0.0 - 0.7 K/uL   Basophils Relative 0 %   Basophils Absolute 0.0 0.0 - 0.1 K/uL   Smear Review MORPHOLOGY UNREMARKABLE    nRBC 0 0 /100 WBC    Comment: Performed at [facility], [place] [address] [place], [place] [address]  Troponin I     Status: Abnormal   Collection Time: 10/18/17  4:12 PM  Result Value Ref Range   Troponin I 0.04 (HH) <0.03 ng/mL    Comment: CRITICAL RESULT CALLED TO, READ BACK BY AND VERIFIED WITH: T.WISE RN AT 1716 ON 7/28/109 BY S.VANHOORNE Performed at [facility], [place] [address] [place], [place] [address]   Comprehensive metabolic panel     Status: Abnormal   Collection Time: 10/18/17  4:12 PM  Result Value Ref Range   Sodium 139 135 - 145 mmol/L   Potassium 4.6 3.5 - 5.1 mmol/L   Chloride 94 (L) 98 - 111 mmol/L   CO2 26 22 - 32 mmol/L   Glucose, Bld 123 (H) 70 - 99 mg/dL   BUN 50 (H) 6 - 20 mg/dL   Creatinine, Ser 6.96 (H) 0.61 - 1.24 mg/dL   Calcium 8.9 8.9 - 10.3  mg/dL   Total Protein 7.7 6.5 - 8.1 g/dL   Albumin 3.1 (L) 3.5 - 5.0 g/dL   AST 21 15 - 41 U/L   ALT 18 0 - 44 U/L   Alkaline Phosphatase 89 38 - 126 U/L   Total Bilirubin 0.7 0.3 - 1.2 mg/dL   GFR calc non Af Amer 8 (L) >60 mL/min   GFR calc Af Amer 9 (L) >60 mL/min    Comment: (NOTE) The eGFR has been calculated using the CKD EPI equation. This calculation has not been validated in all clinical situations. eGFR's persistently <60 mL/min signify possible Chronic Kidney Disease.    Anion gap 19 (H) 5 - 15    Comment: Performed at [facility], [place] [address] [place], [place] [address]  Lipase, blood     Status: None   Collection Time: 10/18/17  4:12 PM  Result Value Ref Range   Lipase 27 11 - 51 U/L    Comment: Performed at [facility], [place] [address] [place], [place] [address]  Brain natriuretic peptide     Status: Abnormal   Collection Time: 10/18/17  4:12 PM  Result Value Ref Range   B Natriuretic Peptide 4,265.5 (H) 0.0 - 100.0 pg/mL    Comment: Performed at [facility], [place] [address] [place], [place] [address]   Dg Chest 2 View  Result Date: 10/18/2017 CLINICAL DATA:  Shortness of breath for 2 days EXAM: CHEST - 2 VIEW COMPARISON:  10/05/2017 FINDINGS: Cardiac shadow is enlarged but stable. Increasing right basilar infiltrate with associated effusion is noted. Left lung remains clear. No bony abnormality is seen. IMPRESSION: Increasing right basilar infiltrate and right-sided effusion. Electronically Signed   By: Inez Catalina M.D.   On: 10/18/2017 15:36    Pending Labs Unresulted Labs (From admission, onward)   Start     Ordered   10/19/17 7026  Basic metabolic panel  Daily,   R     10/18/17 2017   10/18/17 2014  HIV antibody (Routine Testing)  Once,   R     10/18/17 2017      Vitals/Pain Today's Vitals   10/18/17 1947 10/18/17 2044 10/18/17 2100 10/18/17 2145  BP:  (!) 165/100 (!) 159/91 (!) 169/97   Pulse:  87 86 87  Resp:  (!) 21 (!) 25 (!) 24  Temp:  98.7 F (37.1 C)    TempSrc:  Oral    SpO2:  94% 94% 94%  Weight: 170 lb 8 oz (77.3 kg)     PainSc:  1       Isolation Precautions No active isolations  Medications Medications  vancomycin (VANCOCIN) 2,000 mg in sodium chloride 0.9 % 500 mL IVPB (2,000 mg Intravenous New Bag/Given 10/18/17 2044)  dextromethorphan-guaiFENesin (ROBITUSSIN-DM) 10-100 MG/5ML liquid 10 mL (has no administration in time range)  guaiFENesin (MUCINEX) 12 hr tablet 1,200 mg (has no administration in time range)  loratadine (CLARITIN) tablet 10 mg (has no administration in time range)  losartan (COZAAR) tablet 25 mg (has no administration in time range)  multivitamin (RENA-VIT) tablet 1 tablet (has no administration in time range)  predniSONE (DELTASONE) tablet 5 mg (has no administration in time range)  tacrolimus (PROGRAF) capsule 5 mg (has no administration in time range)  sevelamer carbonate (RENVELA) tablet 800 mg (has no administration in time range)  NIFEdipine (PROCARDIA XL/ADALAT-CC) 24 hr tablet 60 mg (has no administration in time range)  pravastatin (PRAVACHOL) tablet 80 mg (has no administration in time range)  furosemide (LASIX) tablet 80 mg (has no administration in time range)  labetalol (NORMODYNE) tablet 200 mg (has no administration in time range)  cloNIDine (CATAPRES) tablet 0.1 mg (has no administration in time range)  sodium chloride flush (NS) 0.9 % injection 3 mL (has no administration in time range)  sodium chloride flush (NS) 0.9 % injection 3 mL (has no administration in time range)  0.9 %  sodium chloride infusion (has no administration in time range)  acetaminophen (TYLENOL) tablet 650 mg (has no administration in time range)  ondansetron (ZOFRAN) injection 4 mg (has no administration in time range)  heparin injection 5,000 Units (has no administration in time range)  ceFEPIme (MAXIPIME) 2 g in sodium chloride 0.9 % 100 mL  IVPB (0 g Intravenous Stopped 10/18/17 2001)  furosemide (LASIX) injection 40 mg (40 mg Intravenous Given 10/18/17 1859)    Mobility walks

## 2017-10-18 NOTE — ED Notes (Signed)
PA aware of lab values.

## 2017-10-19 ENCOUNTER — Inpatient Hospital Stay: Payer: Medicare Other | Admitting: Hematology and Oncology

## 2017-10-19 ENCOUNTER — Observation Stay (HOSPITAL_COMMUNITY): Payer: Medicare Other

## 2017-10-19 ENCOUNTER — Encounter (HOSPITAL_COMMUNITY): Payer: Self-pay | Admitting: Physician Assistant

## 2017-10-19 ENCOUNTER — Telehealth: Payer: Self-pay | Admitting: Hematology and Oncology

## 2017-10-19 DIAGNOSIS — J90 Pleural effusion, not elsewhere classified: Secondary | ICD-10-CM

## 2017-10-19 DIAGNOSIS — N186 End stage renal disease: Secondary | ICD-10-CM | POA: Diagnosis not present

## 2017-10-19 DIAGNOSIS — J948 Other specified pleural conditions: Secondary | ICD-10-CM | POA: Diagnosis not present

## 2017-10-19 DIAGNOSIS — I10 Essential (primary) hypertension: Secondary | ICD-10-CM

## 2017-10-19 DIAGNOSIS — I5033 Acute on chronic diastolic (congestive) heart failure: Secondary | ICD-10-CM

## 2017-10-19 DIAGNOSIS — R0989 Other specified symptoms and signs involving the circulatory and respiratory systems: Secondary | ICD-10-CM | POA: Diagnosis not present

## 2017-10-19 HISTORY — PX: IR THORACENTESIS ASP PLEURAL SPACE W/IMG GUIDE: IMG5380

## 2017-10-19 LAB — RENAL FUNCTION PANEL
Albumin: 2.5 g/dL — ABNORMAL LOW (ref 3.5–5.0)
Anion gap: 13 (ref 5–15)
BUN: 59 mg/dL — ABNORMAL HIGH (ref 6–20)
CO2: 26 mmol/L (ref 22–32)
Calcium: 7.9 mg/dL — ABNORMAL LOW (ref 8.9–10.3)
Chloride: 96 mmol/L — ABNORMAL LOW (ref 98–111)
Creatinine, Ser: 8.21 mg/dL — ABNORMAL HIGH (ref 0.61–1.24)
GFR calc Af Amer: 7 mL/min — ABNORMAL LOW (ref >60)
GFR calc non Af Amer: 6 mL/min — ABNORMAL LOW (ref >60)
Glucose, Bld: 126 mg/dL — ABNORMAL HIGH (ref 70–99)
Phosphorus: 5.1 mg/dL — ABNORMAL HIGH (ref 2.5–4.6)
Potassium: 4.7 mmol/L (ref 3.5–5.1)
Sodium: 135 mmol/L (ref 135–145)

## 2017-10-19 LAB — AMYLASE, PLEURAL OR PERITONEAL FLUID: Amylase, Fluid: 32 U/L

## 2017-10-19 LAB — CBC
HCT: 27.3 % — ABNORMAL LOW (ref 39.0–52.0)
Hemoglobin: 8 g/dL — ABNORMAL LOW (ref 13.0–17.0)
MCH: 22.5 pg — ABNORMAL LOW (ref 26.0–34.0)
MCHC: 29.3 g/dL — ABNORMAL LOW (ref 30.0–36.0)
MCV: 76.9 fL — ABNORMAL LOW (ref 78.0–100.0)
Platelets: 252 10*3/uL (ref 150–400)
RBC: 3.55 MIL/uL — ABNORMAL LOW (ref 4.22–5.81)
RDW: 21.3 % — ABNORMAL HIGH (ref 11.5–15.5)
WBC: 6.9 10*3/uL (ref 4.0–10.5)

## 2017-10-19 LAB — BODY FLUID CELL COUNT WITH DIFFERENTIAL
EOS FL: 1 %
Lymphs, Fluid: 51 %
MONOCYTE-MACROPHAGE-SEROUS FLUID: 40 % — AB (ref 50–90)
NEUTROPHIL FLUID: 8 % (ref 0–25)
WBC FLUID: 495 uL (ref 0–1000)

## 2017-10-19 LAB — GLUCOSE, PLEURAL OR PERITONEAL FLUID: Glucose, Fluid: 158 mg/dL

## 2017-10-19 LAB — GRAM STAIN

## 2017-10-19 LAB — PROTEIN, PLEURAL OR PERITONEAL FLUID: Total protein, fluid: <3 g/dL

## 2017-10-19 LAB — LACTATE DEHYDROGENASE, PLEURAL OR PERITONEAL FLUID: LD, Fluid: 68 U/L — ABNORMAL HIGH (ref 3–23)

## 2017-10-19 LAB — TROPONIN I: Troponin I: <0.03 ng/mL (ref <0.03)

## 2017-10-19 MED ORDER — LIDOCAINE HCL 2 % IJ SOLN
INTRAMUSCULAR | Status: AC | PRN
  Administered 2017-10-19: 10 mL

## 2017-10-19 MED ORDER — DOXERCALCIFEROL 4 MCG/2ML IV SOLN
INTRAVENOUS | Status: AC
  Filled 2017-10-19: qty 4

## 2017-10-19 MED ORDER — SODIUM CHLORIDE 0.9 % IV SOLN: 100.0000 mL | INTRAVENOUS | Status: DC | PRN

## 2017-10-19 MED ORDER — CHLORHEXIDINE GLUCONATE CLOTH 2 % EX PADS: 6.0000 | MEDICATED_PAD | Freq: Every day | CUTANEOUS | Status: DC

## 2017-10-19 MED ORDER — LIDOCAINE HCL (PF) 2 % IJ SOLN
INTRAMUSCULAR | Status: AC
  Filled 2017-10-19: qty 20

## 2017-10-19 MED ORDER — PENTAFLUOROPROP-TETRAFLUOROETH EX AERO: 1.0000 "application " | INHALATION_SPRAY | CUTANEOUS | Status: DC | PRN

## 2017-10-19 MED ORDER — ALTEPLASE 2 MG IJ SOLR: 2.0000 mg | Freq: Once | INTRAMUSCULAR | Status: DC | PRN

## 2017-10-19 MED ORDER — LIDOCAINE HCL (PF) 1 % IJ SOLN: 5.0000 mL | INTRAMUSCULAR | Status: DC | PRN

## 2017-10-19 MED ORDER — DOXERCALCIFEROL 4 MCG/2ML IV SOLN
5.0000 ug | INTRAVENOUS | Status: DC
  Administered 2017-10-19: 5 ug via INTRAVENOUS
  Filled 2017-10-19: qty 4

## 2017-10-19 MED ORDER — LIDOCAINE HCL (PF) 2 % IJ SOLN
INTRAMUSCULAR | Status: AC
  Filled 2017-10-19: qty 10

## 2017-10-19 MED ORDER — HYDRALAZINE HCL 20 MG/ML IJ SOLN
10.0000 mg | Freq: Once | INTRAMUSCULAR | Status: AC
  Administered 2017-10-19: 10 mg via INTRAVENOUS
  Filled 2017-10-19: qty 1

## 2017-10-19 MED ORDER — DOXERCALCIFEROL 4 MCG/2ML IV SOLN: 4.0000 ug | INTRAVENOUS | Status: DC

## 2017-10-19 MED ORDER — LIDOCAINE-PRILOCAINE 2.5-2.5 % EX CREA
1.0000 "application " | TOPICAL_CREAM | CUTANEOUS | Status: DC | PRN
  Filled 2017-10-19: qty 5

## 2017-10-19 NOTE — Progress Notes (Signed)
2340 Text paged NP X. Blount about patient arrival and blood pressure elevated 173/113. 2348 Order received to give hydralazine 5 mg IV one time dose.Will give and continue to monitor patient.

## 2017-10-19 NOTE — Progress Notes (Addendum)
SATURATION QUALIFICATIONS: (This note is used to comply with regulatory documentation for home oxygen)  Patient Saturations on Room Air at Rest = 95%   Patient Saturations on Room Air while Ambulating = 100 % While walking 250 ft; pt tolerated well.  No SOB

## 2017-10-19 NOTE — Procedures (Signed)
PROCEDURE SUMMARY:  Successful image-guided right thoracentesis. Yielded 1.8 liters of serosanguinous fluid. Patient tolerated procedure well. No immediate complications.  Specimen was sent for labs. CXR ordered.  Joaquim Nam PA-C 10/19/2017 11:41 AM

## 2017-10-19 NOTE — Care Management Note (Signed)
Case Management Note  Patient Details  Name: AYO SMOAK MRN: [id number] Date of Birth: [birth year]  Subjective/Objective:     CHF              Action/Plan: Patient is independent of his ADL's; PCP: Orma Flaming, MD; has private insurance with Medicare; CM will continue to follow for progression of care.  Expected Discharge Date:    possibly 10/20/2017              Expected Discharge Plan:  Home/Self Care  In-House Referral:   SW  Discharge planning Services  CM Consult  Status of Service:  In process, will continue to follow  Sherrilyn Rist [phone] 10/19/2017, 12:56 PM

## 2017-10-19 NOTE — Telephone Encounter (Signed)
Patient called to reschedule he is in the hospital

## 2017-10-19 NOTE — Progress Notes (Signed)
Patient blood pressure elevated 187/117.Text paged X.Blount NP awaiting response.

## 2017-10-19 NOTE — Consult Note (Addendum)
[facility] Renal Consultation Note    Indication for Consultation:  Management of ESRD/hemodialysis; anemia, hypertension/volume and secondary hyperparathyroidism PCP: Orma Flaming, MD  HPI: Ryan [name] is a 57 y.o. male with ESRD secondary to HTN with history of prior LRD kidney transplany from 2010 to 2018, hx ureteral stent, Fe deficiency anemia,  CHF who presented from home with SOB, DOE and weakness since yesterday.  He ran 4.5 of his 4 hr  dialysis treatment Saturday and left 4 kg above his EDW of 72.5 with a net UF of only 2.4 L with his lowest SBP being in the 150s Pre and post BP were in the 160/90 range.  HE was offered an extra treatment for Monday and declined.  ROS is also significant for cough, sleeping difficulties relate dto cough, vomiting x 1 could be secondary to coughing, LE edema. No fever, chills, diarrhea.   He has a history of right thoracentesis 08/31/17 (fluid benign) at [facility]. Admission labs showed elevated BNP 4265 K 4.6 lipas 27 LFT normal, hgb 9.3 plts 348 WBC 9.1 with normal diff. Trop are 0.04 and  0.03. Admission CXR showed increasing right basilar infiltrate and right sided effusion.  Following thoracentesis of 1.8 L of serosanguinous fluid CXR showed no ptx with minimal remaining fluid.    He has been living in his "office" at the [place] [facility] [place] [employer] [place].   Past Medical History:  Diagnosis Date  . Allergy   . Blood transfusion without reported diagnosis   . Dialysis patient ([place])    Tues,thurs,sat  . ESRD (end stage renal disease) ([place])   . Hyperlipidemia   . Hypertension    Past Surgical History:  Procedure Laterality Date  . A/V FISTULAGRAM Left 12/15/2016   Procedure: A/V Fistulagram - left;  Surgeon: Angelia Mould, MD;  Location: [place] [facility];  Service: Cardiovascular;  Laterality: Left;  . AV FISTULA PLACEMENT Left 08/28/2016   Procedure: LEFT UPPER  ARM ARTERIOVENOUS (AV) FISTULA CREATION;  Surgeon: Angelia Mould, MD;  Location: [facility];  Service: Vascular;  Laterality: Left;  . FISTULA SUPERFICIALIZATION Left 09/23/2017   Procedure: FISTULA PLICATION LEFT ARM;  Surgeon: Elam Dutch, MD;  Location: [facility] OR;  Service: Vascular;  Laterality: Left;  . IR THORACENTESIS ASP PLEURAL SPACE W/IMG GUIDE  08/22/2017   1.2 L -right-sided  . stent in kidneys     dec 2017   Family History  Problem Relation Age of Onset  . Hypertension Mother   . Heart disease Mother 35       By his report, he thinks that she had heart attack.  . Stroke Mother   . Cancer Father   . Kidney cancer Father   . Hypertension Sister   . Heart disease Maternal Grandmother   . Alcohol abuse Maternal Grandfather   . Mental illness Paternal Grandmother   . Learning disabilities Paternal Grandmother        Alzheimer's   . Stroke Paternal Grandfather   . Colon cancer Neg Hx   . Colon polyps Neg Hx   . Esophageal cancer Neg Hx   . Rectal cancer Neg Hx   . Stomach cancer Neg Hx    Social History:  reports that he has never smoked. He has never used smokeless tobacco. He reports that he does not drink alcohol or use drugs. Allergies  Allergen Reactions  . Azithromycin Nausea And Vomiting and Other (See Comments)    Chest tightness   Prior to  Admission medications   Medication Sig Start Date End Date Taking? Authorizing Provider  cloNIDine (CATAPRES) 0.1 MG tablet Take 1 tablet (0.1 mg total) by mouth 2 (two) times daily. 09/24/17  Yes Sheikh, Omair Latif, DO  dextromethorphan-guaiFENesin (ROBITUSSIN-DM) 10-100 MG/5ML liquid Take 10 mLs by mouth every 12 (twelve) hours as needed for cough.   Yes [provider]  furosemide (LASIX) 80 MG tablet Take 1 tablet (80 mg total) by mouth 2 (two) times daily. 10/05/17  Yes Orma Flaming, MD  guaiFENesin (MUCINEX) 600 MG 12 hr tablet Take 2 tablets (1,200 mg total) by mouth 2 (two) times daily. Patient taking differently: Take 1,200 mg by mouth 2 (two) times daily as  needed for cough.  09/24/17  Yes Sheikh, Omair Latif, DO  labetalol (NORMODYNE) 200 MG tablet Take 1 tablet (200 mg total) by mouth 2 (two) times daily. 09/24/17  Yes Sheikh, Omair Latif, DO  losartan (COZAAR) 25 MG tablet Take 1 tablet (25 mg total) by mouth daily. 10/13/17  Yes Orma Flaming, MD  multivitamin (RENA-VIT) TABS tablet Take 1 tablet by mouth daily.   Yes [provider]  NIFEdipine (PROCARDIA XL/ADALAT-CC) 60 MG 24 hr tablet Take 60 mg by mouth 2 (two) times daily.   Yes [provider]  pravastatin (PRAVACHOL) 80 MG tablet TAKE 1 TABLET BY MOUTH EVERY DAY Patient taking differently: TAKE 1 TABLET BY MOUTH EVERY DAY AT BEDTIME 09/08/17  Yes Inda Coke, PA  predniSONE (DELTASONE) 5 MG tablet Take 5 mg by mouth daily with breakfast.   Yes [provider]  sevelamer carbonate (RENVELA) 800 MG tablet Take 1 tablet (800 mg total) by mouth 3 (three) times daily with meals. 09/24/17  Yes Sheikh, Omair Latif, DO  tacrolimus (PROGRAF) 5 MG capsule Take 5 mg by mouth 2 (two) times daily.   Yes [provider]  loratadine (CLARITIN) 10 MG tablet Take 10 mg by mouth daily as needed for allergies.    [provider]   Current Facility-Administered Medications  Medication Dose Route Frequency Provider Last Rate Last Dose  . 0.9 %  sodium chloride infusion  250 mL Intravenous PRN [name], DO      . acetaminophen (TYLENOL) tablet 650 mg  650 mg Oral Q4H PRN [name], DO      . cloNIDine (CATAPRES) tablet 0.1 mg  0.1 mg Oral BID Jennette Kettle M, DO   0.1 mg at 10/19/17 0258  . furosemide (LASIX) tablet 80 mg  80 mg Oral BID [name], DO   80 mg at 10/19/17 5277  . guaiFENesin (MUCINEX) 12 hr tablet 1,200 mg  1,200 mg Oral BID PRN [name], DO      . guaiFENesin-dextromethorphan (ROBITUSSIN DM) 100-10 MG/5ML syrup 10 mL  10 mL Oral Q12H PRN [name], DO      . heparin injection 5,000 Units  5,000 Units Subcutaneous  Q8H Alcario Drought, Jared M, DO      . labetalol (NORMODYNE) tablet 200 mg  200 mg Oral BID Jennette Kettle M, DO   200 mg at 10/19/17 8242  . lidocaine (XYLOCAINE) 2 % injection           . lidocaine (XYLOCAINE) 2 % injection           . loratadine (CLARITIN) tablet 10 mg  10 mg Oral Daily PRN [name], DO      . losartan (COZAAR) tablet 25 mg  25 mg Oral Daily Alcario Drought,  Toy Care, DO   25 mg at 10/19/17 0926  . multivitamin (RENA-VIT) tablet 1 tablet  1 tablet Oral Daily Jennette Kettle M, DO   1 tablet at 10/19/17 0926  . NIFEdipine (PROCARDIA XL/ADALAT-CC) 24 hr tablet 60 mg  60 mg Oral BID Jennette Kettle M, DO   60 mg at 10/19/17 8299  . ondansetron (ZOFRAN) injection 4 mg  4 mg Intravenous Q6H PRN [name], DO      . pravastatin (PRAVACHOL) tablet 80 mg  80 mg Oral QHS Jennette Kettle M, DO   80 mg at 10/19/17 0026  . predniSONE (DELTASONE) tablet 5 mg  5 mg Oral Q breakfast [name], DO   5 mg at 10/19/17 3716  . sevelamer carbonate (RENVELA) tablet 800 mg  800 mg Oral TID WC Jennette Kettle M, DO   800 mg at 10/19/17 0925  . sodium chloride flush (NS) 0.9 % injection 3 mL  3 mL Intravenous Q12H Jennette Kettle M, DO   3 mL at 10/19/17 0926  . sodium chloride flush (NS) 0.9 % injection 3 mL  3 mL Intravenous PRN [name], DO      . tacrolimus (PROGRAF) capsule 5 mg  5 mg Oral BID [name], DO   5 mg at 10/19/17 0925   Labs: Basic Metabolic Panel: Recent Labs  Lab 10/18/17 1612  NA 139  K 4.6  CL 94*  CO2 26  GLUCOSE 123*  BUN 50*  CREATININE 6.96*  CALCIUM 8.9   Liver Function Tests: Recent Labs  Lab 10/18/17 1612  AST 21  ALT 18  ALKPHOS 89  BILITOT 0.7  PROT 7.7  ALBUMIN 3.1*   Recent Labs  Lab 10/18/17 1612  LIPASE 27   CBC: Recent Labs  Lab 10/18/17 1612  WBC 9.1  NEUTROABS 7.1  HGB 9.3*  HCT 30.7*  MCV 75.4*  PLT 348   Cardiac Enzymes: Recent Labs  Lab 10/18/17 1612 10/19/17 0748  TROPONINI 0.04* <0.03    Studies/Results: Dg Chest 2 View  Result Date: 10/18/2017 CLINICAL DATA:  Shortness of breath for 2 days EXAM: CHEST - 2 VIEW COMPARISON:  10/05/2017 FINDINGS: Cardiac shadow is enlarged but stable. Increasing right basilar infiltrate with associated effusion is noted. Left lung remains clear. No bony abnormality is seen. IMPRESSION: Increasing right basilar infiltrate and right-sided effusion. Electronically Signed   By: Inez Catalina M.D.   On: 10/18/2017 15:36    ROS: As per HPI otherwise negative.  Physical Exam: Vitals:   10/19/17 0024 10/19/17 0459 10/19/17 0619 10/19/17 0739  BP: (!) 180/85 (!) 187/117 (!) 184/111 (!) 174/96  Pulse:  80 82 87  Resp:  18    Temp:  97.9 F (36.6 C)    TempSrc:  Oral    SpO2:  97%    Weight:  77.9 kg (171 lb 11.2 oz)    Height:         General: WDWN NAD Head: NCAT sclera not icteric MMM Neck: Supple.  Lungs: dim BS poor expansion Breathing is unlabored. Heart: RRR with S1 S2.  Abdomen: soft NT + BS Lower extremities: tr LE edema or ischemic changes, no open wounds  Neuro: A & O  X 3. Moves all extremities spontaneously. Psych:  Responds to questions appropriately with a normal affect. Dialysis Access: left AVF + bruit  Dialysis Orders: TTS NW 4 Hr 450/800 EDW 72.5 2 K 2 Ca left AVF no heparin Hectorol 5 venofer 100  through 8/15 Mircera 225 - last given 7/18   Assessment/Plan: 1. SOB - secondary to recurrent  right pleural effusion/volume excess - s/p 1.8 L thoracentesis this am. Improved - weight pre HD today 76.4 - still above EDW after thoracentesis - plan net UF 3 L  2. ESRD -  TTS - left about 4 kg above EDW on Saturday- at that same pre HD wt today post thoracentesis; plan extra tmt today for volume K 4.6  3. Hypertension/volume  - well above EDW - extra tmt today for volume then possibly d/c this evening post HD to go to outpatient HD -per usual routine; cramps limit UF on HD - set goal for 3 L today 4. Anemia  - hgb 9.3 - stable -  due for redose of ESA Thursday/continue repletion of Fe after d/c- has chronic low tsat - will ask HD unit to do FOBT after d/c 5. Metabolic bone disease -  On VDRA/on renvela and sensipar - iPH down to 40 7/11 likely due to sensipar - Hectorol reduced to 5 from 6  In July in response to drop in iPTH - will hold sensipar for now (still on outpt med list and he has been taking) 6. Nutrition - renal diet 7. Disp - discussed with Dr. Eliseo Squires - anticipate d/c later today after HD to return to outpatient HD unit tomorrow.   Myriam Jacobson, PA-C [place] [phone] 10/19/2017, 10:47 AM   Patient seen and examined, agree with above note with above modifications. Pt seen on HD- feeling better, really mostly from thoracentesis- goal of 3500- 1200 removed so far- BP soft.  I agree with extra treatment and holding sensipar.  Possible for discharge later today or early tomorrow to go to his OP unit tomorrow  Corliss Parish, MD 10/19/2017

## 2017-10-19 NOTE — Progress Notes (Signed)
Patient arrived via care link to unit [place] bed 21.Assisted patient to bed by nursing staff. Patient placed on telemetry denies chest pain or discomfort at present time.Patint does complain of shortness of breath with activity on oxygen at 3 liters nasal canula.Oriented patient to nursing unit ,call bell and phone.Patient not at risk for falls but instructed to call for help before going to bathroom and verbalized understanding.Patient has urinal at bedside.Will notify MD about arrival to unit.

## 2017-10-19 NOTE — Progress Notes (Signed)
Called MD- Dr. Eliseo Squires ok to discharge patient.   Discussed Discharge instructions including follow up appointments and medications.  Encouraged patient to monitor breathing and weight closely.  Patient did not have a scale- encouraged patient to get a scale and weight daily in am (after voiding).

## 2017-10-19 NOTE — Discharge Summary (Signed)
Physician Discharge Summary  Ryan [name] HUD:[id number] DOB: [birth year] DOA: 10/18/2017  PCP: Orma Flaming, MD  Admit date: 10/18/2017 Discharge date: 10/19/2017  Admitted From:  Discharge disposition: home   Recommendations for Outpatient Follow-Up:   1. Need to arrive to HD on time for complete session   Discharge Diagnosis:   Principal Problem:   Acute on chronic diastolic CHF (congestive heart failure) (HCC) Active Problems:   Renal transplant, status post   Hypertension   DOE (dyspnea on exertion)   ESRD on dialysis [facility])   Recurrent right pleural effusion    Discharge Condition: Improved.  Diet recommendation: renal diet  Wound care: None.  Code status: Full.   History of Present Illness:   Ryan [name] is a 57 y.o. 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  Dialysis is TTS.  HLD, HTN, CHF with grade 2 diastolic dysfunction preserved EF.  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  Patient recently had R pleural effusion drained on 6/10 at [facility].  Fluid analysis was basically negative.  Patient recently admitted to our service 6/29-7/5 with fluid overload due to missed dialysis.  Discharge wt is recorded as 72kg on 7/5.  Wt was 77.5kg on 7/15 at PCP follow up.  He states he was feeling okay at that time.  He presents to the ED today for SOB, generalized weakness.  Symptoms onset yesterday.  He states that he felt like he had no energy and states that he got tired and fatigued with just walking to the restroom. Patient states that he was also having some intermittent shortness of breath. He does wake states that the shortness of breath was worse with exertion. Patient states that he has had a cough has been ongoing for several weeks. He states that first was productive but nauseous with clear phlegm. He states he has been taking some cough medicine with  minimal improvement. He does report difficulty sleeping secondary to cough. Patient reports that he went to dialysis yesterday. He states that he had 3.4 L removed (usually has about 4L off).  Patient reports that sometimes he gets short of breath right before he goes to dialysis so he decided to go to dialysis to see if that would help the symptoms. Patient reports that last night after dialysis he was still having some shortness of breath. Patient reports that today he was getting ready for church and just felt so tired and fatigued so he came to the emergency department instead. Patient also reports he had 2 episodes of nonbloody, nonbilious emesis this morning. He denies any abdominal pain. Patient reports he has noted some swelling in his bilateral lower extremities. He states that this is common for him and states that it will fluctuate with his dialysis. Patient denies any fever, chest pain, abdominal pain.Patient denies any history of COPD, Asthma.       Hospital Course by Problem:   SOB - secondary to recurrent  right pleural effusion/volume excess - s/p 1.8 L thoracentesis  -weight above EDW  -got extra HD on 7/29 -no further SOB/desaturation  ESRD -  TTS - left about 4 kg above EDW on Saturday -patient has been arriving late to HD due to personal reasons, so not able to complete HD hence the extra volume  Hypertension/volume   -HD 7/29  Anemia  of CD -per renal  Metabolic bone disease -   -per renal  Medical Consultants:    IR renal  Discharge Exam:   Vitals:   10/19/17 1515 10/19/17 1530  BP: 115/64 (!) 114/59  Pulse: 84 82  Resp:    Temp:    SpO2:     Vitals:   10/19/17 1445 10/19/17 1500 10/19/17 1515 10/19/17 1530  BP: 122/64 122/68 115/64 (!) 114/59  Pulse: 80 83 84 82  Resp:      Temp:      TempSrc:      SpO2:      Weight:      Height:        General exam: Appears calm and comfortable. No de-saturations with  walking  The results of significant diagnostics from this hospitalization (including imaging, microbiology, ancillary and laboratory) are listed below for reference.     Procedures and Diagnostic Studies:   Dg Chest 1 View  Result Date: 10/19/2017 CLINICAL DATA:  Status post right-sided thoracentesis today. EXAM: CHEST  1 VIEW COMPARISON:  Chest x-ray of December 19, 2017 FINDINGS: The right lung remains well expanded. No postprocedure pneumothorax is observed. The volume of pleural fluid on the right has decreased significantly. The cardiac silhouette is enlarged. There is mild central pulmonary vascular engorgement. The trachea is midline. The bony thorax exhibits no acute abnormality. IMPRESSION: No postprocedure complication following right-sided thoracentesis. Only a small amount of pleural fluid remains. Electronically Signed   By: David  Martinique M.D.   On: 10/19/2017 11:07   Dg Chest 2 View  Result Date: 10/18/2017 CLINICAL DATA:  Shortness of breath for 2 days EXAM: CHEST - 2 VIEW COMPARISON:  10/05/2017 FINDINGS: Cardiac shadow is enlarged but stable. Increasing right basilar infiltrate with associated effusion is noted. Left lung remains clear. No bony abnormality is seen. IMPRESSION: Increasing right basilar infiltrate and right-sided effusion. Electronically Signed   By: Inez Catalina M.D.   On: 10/18/2017 15:36   Ir Thoracentesis Asp Pleural Space W/img Guide  Result Date: 10/19/2017 INDICATION: Recurrent pleural effusion - previous thoracentesis 6/10 at [facility]. History of chronic diastolic CHF. Presented to [facility] ED with increased SOB and cough. Request for diagnostic and therapeutic thoracentesis. EXAM: ULTRASOUND GUIDED RIGHT THORACENTESIS MEDICATIONS: None. COMPLICATIONS: 15 mL 2% lidocaine. PROCEDURE: An ultrasound guided thoracentesis was thoroughly discussed with the patient and questions answered. The benefits, risks, alternatives and complications were also discussed. The  patient understands and wishes to proceed with the procedure. Written consent was obtained. Ultrasound was performed to localize and mark an adequate pocket of fluid in the right chest. The area was then prepped and draped in the normal sterile fashion. 2% Lidocaine was used for local anesthesia. Under ultrasound guidance a 6 Fr Safe-T-Centesis catheter was introduced. Thoracentesis was performed. The catheter was removed and a dressing applied. FINDINGS: A total of approximately 1.8L of serosanguineous fluid was removed. Samples were sent to the laboratory as requested by the clinical team. IMPRESSION: Successful ultrasound guided right thoracentesis yielding 1.8L of pleural fluid. Read by Candiss Norse, PA-C Electronically Signed   By: Markus Daft M.D.   On: 10/19/2017 11:49     Labs:   Basic Metabolic Panel: Recent Labs  Lab 10/18/17 1612 10/19/17 1518  NA 139 135  K 4.6 4.7  CL 94* 96*  CO2 26 26  GLUCOSE 123* 126*  BUN 50* 59*  CREATININE 6.96* 8.21*  CALCIUM 8.9 7.9*  PHOS  --  5.1*   GFR Estimated Creatinine Clearance: 10 mL/min (A) (by C-G formula based on SCr of 8.21  mg/dL (H)). Liver Function Tests: Recent Labs  Lab 10/18/17 1612 10/19/17 1518  AST 21  --   ALT 18  --   ALKPHOS 89  --   BILITOT 0.7  --   PROT 7.7  --   ALBUMIN 3.1* 2.5*   Recent Labs  Lab 10/18/17 1612  LIPASE 27   No results for input(s): AMMONIA in the last 168 hours. Coagulation profile No results for input(s): INR, PROTIME in the last 168 hours.  CBC: Recent Labs  Lab 10/18/17 1612 10/19/17 1518  WBC 9.1 6.9  NEUTROABS 7.1  --   HGB 9.3* 8.0*  HCT 30.7* 27.3*  MCV 75.4* 76.9*  PLT 348 252   Cardiac Enzymes: Recent Labs  Lab 10/18/17 1612 10/19/17 0748  TROPONINI 0.04* <0.03   BNP: Invalid input(s): POCBNP CBG: No results for input(s): GLUCAP in the last 168 hours. D-Dimer No results for input(s): DDIMER in the last 72 hours. Hgb A1c No results for input(s): HGBA1C  in the last 72 hours. Lipid Profile No results for input(s): CHOL, HDL, LDLCALC, TRIG, CHOLHDL, LDLDIRECT in the last 72 hours. Thyroid function studies No results for input(s): TSH, T4TOTAL, T3FREE, THYROIDAB in the last 72 hours.  Invalid input(s): FREET3 Anemia work up No results for input(s): VITAMINB12, FOLATE, FERRITIN, TIBC, IRON, RETICCTPCT in the last 72 hours. Microbiology No results found for this or any previous visit (from the past 240 hour(s)).   Discharge Instructions:   Discharge Instructions    Discharge instructions   Complete by:  As directed    Renal diet Be sure to arrive on time for HD so you can get your entire session--- or discuss with Dr. Lorrene Reid a change of time to later in the day   Increase activity slowly   Complete by:  As directed      Allergies as of 10/19/2017      Reactions   Azithromycin Nausea And Vomiting, Other (See Comments)   Chest tightness      Medication List    TAKE these medications   cloNIDine 0.1 MG tablet Commonly known as:  CATAPRES Take 1 tablet (0.1 mg total) by mouth 2 (two) times daily.   dextromethorphan-guaiFENesin 10-100 MG/5ML liquid Commonly known as:  ROBITUSSIN-DM Take 10 mLs by mouth every 12 (twelve) hours as needed for cough.   furosemide 80 MG tablet Commonly known as:  LASIX Take 1 tablet (80 mg total) by mouth 2 (two) times daily.   guaiFENesin 600 MG 12 hr tablet Commonly known as:  MUCINEX Take 2 tablets (1,200 mg total) by mouth 2 (two) times daily. What changed:    when to take this  reasons to take this   labetalol 200 MG tablet Commonly known as:  NORMODYNE Take 1 tablet (200 mg total) by mouth 2 (two) times daily.   loratadine 10 MG tablet Commonly known as:  CLARITIN Take 10 mg by mouth daily as needed for allergies.   losartan 25 MG tablet Commonly known as:  COZAAR Take 1 tablet (25 mg total) by mouth daily.   multivitamin Tabs tablet Take 1 tablet by mouth daily.    NIFEdipine 60 MG 24 hr tablet Commonly known as:  PROCARDIA XL/ADALAT-CC Take 60 mg by mouth 2 (two) times daily.   pravastatin 80 MG tablet Commonly known as:  PRAVACHOL TAKE 1 TABLET BY MOUTH EVERY DAY What changed:    how much to take  how to take this  when to take this  predniSONE 5 MG tablet Commonly known as:  DELTASONE Take 5 mg by mouth daily with breakfast.   sevelamer carbonate 800 MG tablet Commonly known as:  RENVELA Take 1 tablet (800 mg total) by mouth 3 (three) times daily with meals.   tacrolimus 5 MG capsule Commonly known as:  PROGRAF Take 5 mg by mouth 2 (two) times daily.         Time coordinating discharge: 25 min  Signed:  Geradine Girt  [facility] 10/19/2017, 3:45 PM

## 2017-10-19 NOTE — Procedures (Signed)
Patient was seen on dialysis and the procedure was supervised.  BFR 400  Via AVF BP is  128/74.   Patient appears to be tolerating treatment well  Madoline Bhatt A 10/19/2017

## 2017-10-19 NOTE — Progress Notes (Signed)
When Roderic Palau called to give report this nurse was in another patient room providing direct patient care.This nurse got Roderic Palau phone number and told him I would call him back.This nurse attempted to call Roderic Palau back at number he gave  [phone] at [phone] ,[phone],and [phone] each time no one answered the phone.

## 2017-10-20 DIAGNOSIS — N2581 Secondary hyperparathyroidism of renal origin: Secondary | ICD-10-CM | POA: Diagnosis not present

## 2017-10-20 DIAGNOSIS — N186 End stage renal disease: Secondary | ICD-10-CM | POA: Diagnosis not present

## 2017-10-20 DIAGNOSIS — E876 Hypokalemia: Secondary | ICD-10-CM | POA: Diagnosis not present

## 2017-10-20 DIAGNOSIS — D649 Anemia, unspecified: Secondary | ICD-10-CM | POA: Diagnosis not present

## 2017-10-21 LAB — PH, BODY FLUID: pH, Body Fluid: 7.8

## 2017-10-22 ENCOUNTER — Encounter: Payer: Medicare Other | Admitting: Vascular Surgery

## 2017-10-22 ENCOUNTER — Ambulatory Visit (INDEPENDENT_AMBULATORY_CARE_PROVIDER_SITE_OTHER): Payer: Medicare Other | Admitting: Cardiology

## 2017-10-22 ENCOUNTER — Encounter: Payer: Self-pay | Admitting: Cardiology

## 2017-10-22 DIAGNOSIS — N2581 Secondary hyperparathyroidism of renal origin: Secondary | ICD-10-CM | POA: Diagnosis not present

## 2017-10-22 DIAGNOSIS — E876 Hypokalemia: Secondary | ICD-10-CM | POA: Diagnosis not present

## 2017-10-22 DIAGNOSIS — D649 Anemia, unspecified: Secondary | ICD-10-CM | POA: Diagnosis not present

## 2017-10-22 DIAGNOSIS — E854 Organ-limited amyloidosis: Secondary | ICD-10-CM | POA: Diagnosis not present

## 2017-10-22 DIAGNOSIS — Z992 Dependence on renal dialysis: Secondary | ICD-10-CM | POA: Diagnosis not present

## 2017-10-22 DIAGNOSIS — I43 Cardiomyopathy in diseases classified elsewhere: Secondary | ICD-10-CM | POA: Diagnosis not present

## 2017-10-22 DIAGNOSIS — I5032 Chronic diastolic (congestive) heart failure: Secondary | ICD-10-CM | POA: Diagnosis not present

## 2017-10-22 DIAGNOSIS — I10 Essential (primary) hypertension: Secondary | ICD-10-CM

## 2017-10-22 DIAGNOSIS — T8612 Kidney transplant failure: Secondary | ICD-10-CM | POA: Diagnosis not present

## 2017-10-22 DIAGNOSIS — N186 End stage renal disease: Secondary | ICD-10-CM | POA: Diagnosis not present

## 2017-10-22 DIAGNOSIS — R0609 Other forms of dyspnea: Secondary | ICD-10-CM

## 2017-10-22 DIAGNOSIS — E8770 Fluid overload, unspecified: Secondary | ICD-10-CM | POA: Diagnosis not present

## 2017-10-22 MED ORDER — LOSARTAN POTASSIUM 50 MG PO TABS: 50.0000 mg | ORAL_TABLET | Freq: Every day | ORAL | 3 refills | Status: DC

## 2017-10-22 MED ORDER — LABETALOL HCL 300 MG PO TABS: 300.0000 mg | ORAL_TABLET | Freq: Two times a day (BID) | ORAL | 3 refills | Status: DC

## 2017-10-22 NOTE — Progress Notes (Signed)
PCP: Orma Flaming, MD  Nephrologist: Dr. Lorrene Reid  Clinic Note: Chief Complaint  Patient presents with  . Follow-up    abnormal Echo    HPI: Ryan [name] is a 57 y.o. male with history of end-stage hypertensive renal disease who is being seen today for the evaluation of "pulmonary vascular congestion" to be dialyzed at the request of Inda Coke, [place]. --He has had x-rays in February and March of this year showing some vascular congestion and apparently has noted some frequent coughing with light pink sputum.  He has had a history of renal failure with transplant in 2010.  He is now essentially anuric, and back on hemodialysis for about the last year.  He had a left brachycephalic fistula placed by Dr. Scot Dock back in September 2018. -->  Apparently they have been talking about challenging his dry weight of late.  Trey Sailors patient back on September 14, 2017 - (This is after multiple hospitalizations dating backFebruary of this year) please see note from that date for full details.-There is concern for possible heart failure given lower extremity edema of any ongoing for years (not controlled with HD volume removal.  Also chronic cough and recurrent pleural effusions along with pretty significant exertional dyspnea with PND.  Recent Hospitalizations:   June 29 -severe dyspnea having missed dialysis in the day prior to admission.  He was feeling poorly that day.  Currently homeless living with various relatives.  Hypoxic with mild respiratory distress on arrival.  Given breathing treatments and supplemental oxygen.  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  He underwent dialysis for volume removal.  Also noted to have a recurrent right pleural effusion.  July 28: Again noted to be volume overloaded after having missed dialysis.  Was almost 5-1/2 kg up  Studies Personally Reviewed - (if available, images/films reviewed: From  Epic Chart or Care Everywhere)  Transthoracic Echo September 22, 2017: Severe LVH.  Normal function -EF 55-60%.  GRII DD.  Moderate RV dilation with mildly reduced RV function.   Fixed right coronary cusp with very mild aortic stenosis.  The myocardium has a speckled appearance --> .   Recommend cardiac MRI to evaluate for amyloid  Myoview test rescheduled.  Interval History: [name] presents here today for evaluation follow-up after echocardiogram for evaluation for diastolic heart failure and echocardiogram suggesting amyloidosis.  He still has edema and difficult to control blood pressure.  The cough seems to have stabilized now.  Unfortunately he is been back in the hospital a couple times after missing dialysis leading to heart failure type symptoms of volume overload .  He has not had any chest tightness or pressure since I last saw him.  He does have PND orthopnea worse  weight he has not had dialysis. No rapid, irregular HR or palpitations.  No syncope, near syncope or TIA/amaurosis Fugax.  Only notes CP when volume overloaded.  Cardiac review of symptoms: Cardiovascular ROS: positive for - chest pain, dyspnea on exertion, edema, paroxysmal nocturnal dyspnea, shortness of breath and -all Sx exacerbated if he misses HD negative for - irregular heartbeat, loss of consciousness, murmur, orthopnea, palpitations, rapid heart rate or Near syncope, TIA or amaurosis fugax.  He does have some positional dizziness.  He still has persistent cough & DOE that has not improved. +++ edema & orthopnea.  No melena, hematochezia, or epstaxis.  He does have intermittent hematuria/frank blood. No claudication.  ROS: pertinent Sx noted above.  No real change in sx from initial evaluation. Review of Systems  Constitutional: Positive for malaise/fatigue.  HENT: Negative for nosebleeds.   Respiratory: Positive for cough and shortness of breath. Negative for sputum production (occasional pink frothy sputum) and  wheezing.   Cardiovascular: Positive for chest pain (occasional tightness - if he misses HD) and leg swelling.  Gastrointestinal: Negative for blood in stool and melena.  Genitourinary: Negative for hematuria.  Musculoskeletal: Negative for joint pain.  Neurological: Positive for dizziness and tingling (fingers). Negative for focal weakness, seizures, loss of consciousness, weakness and headaches.  Psychiatric/Behavioral: Negative for depression and memory loss. The patient is not nervous/anxious and does not have insomnia.   All other systems reviewed and are negative.  I have reviewed and (if needed) personally updated the patient's problem list, medications, allergies, past medical and surgical history, social and family history.   Past Medical History:  Diagnosis Date  . Allergy   . Blood transfusion without reported diagnosis   . Dialysis patient ([place])    Tues,thurs,sat  . ESRD (end stage renal disease) ([place])   . Hyperlipidemia   . Hypertension     ER visit in May 19, 2017: Intermittent cough for 2 weeks.  Worse lying down.  Thought to be associated with postnasal drip.  Productive of clear mucus.  No fevers chills cold sweats.  Also noted edema and dyspnea.  Rales with bibasilar crackles noted on exam.  Thought to be volume overloaded.  -->  Seen and followed by PCP --> also taking 80 twice daily Lasix with no urine output.  Reported dry weight at that time was 7 8.6 kg and he was currently weighing 82.6 kg; chest x-ray in ER noted to show right pleural effusion  ER visit 07/11/2017 for right lower quadrant pain thought to be related to a small incisional hernia.  ER visit August 22, 2017: Again noted nonproductive cough.  No orthopnea or PND  ER visit at [facility] August 27, 2017: Found to have pericardial effusion, sent for right thoracentesis; treated with moxifloxacin --Noted to be hypertensive with blood pressures in the 150-160/70-90s.=-> Dyspnea notably improved.  Past  Surgical History:  Procedure Laterality Date  . A/V FISTULAGRAM Left 12/15/2016   Procedure: A/V Fistulagram - left;  Surgeon: Angelia Mould, MD;  Location: [facility];  Service: Cardiovascular;  Laterality: Left;  . AV FISTULA PLACEMENT Left 08/28/2016   Procedure: LEFT UPPER  ARM ARTERIOVENOUS (AV) FISTULA CREATION;  Surgeon: Angelia Mould, MD;  Location: [facility];  Service: Vascular;  Laterality: Left;  . FISTULA SUPERFICIALIZATION Left 09/23/2017   Procedure: FISTULA PLICATION LEFT ARM;  Surgeon: Elam Dutch, MD;  Location: [facility];  Service: Vascular;  Laterality: Left;  . IR THORACENTESIS ASP PLEURAL SPACE W/IMG GUIDE  08/22/2017   1.2 L -right-sided  . IR THORACENTESIS ASP PLEURAL SPACE W/IMG GUIDE  10/19/2017  . stent in kidneys     dec 2017  . TRANSTHORACIC ECHOCARDIOGRAM  09/22/2017    Severe LVH.  Normal function -EF 55-60%.  GRII DD.  Moderate RV dilation with mildly reduced RV function.   Fixed right coronary cusp with very mild aortic stenosis.  The myocardium has a speckled appearance --> .   Recommend cardiac MRI to evaluate for amyloid    Current Meds  Medication Sig  . cloNIDine (CATAPRES) 0.1 MG tablet Take 1 tablet (0.1 mg total) by mouth 2 (two) times daily.  Marland Kitchen dextromethorphan-guaiFENesin (ROBITUSSIN-DM) 10-100 MG/5ML liquid Take 10  mLs by mouth every 12 (twelve) hours as needed for cough.  . furosemide (LASIX) 80 MG tablet Take 1 tablet (80 mg total) by mouth 2 (two) times daily.  Marland Kitchen guaiFENesin (MUCINEX) 600 MG 12 hr tablet Take 2 tablets (1,200 mg total) by mouth 2 (two) times daily. (Patient taking differently: Take 1,200 mg by mouth 2 (two) times daily as needed for cough. )  . labetalol (NORMODYNE) 300 MG tablet Take 1 tablet (300 mg total) by mouth 2 (two) times daily.  Marland Kitchen loratadine (CLARITIN) 10 MG tablet Take 10 mg by mouth daily as needed for allergies.  Marland Kitchen losartan (COZAAR) 50 MG tablet Take 1 tablet (50 mg total) by mouth daily.  .  multivitamin (RENA-VIT) TABS tablet Take 1 tablet by mouth daily.  Marland Kitchen NIFEdipine (PROCARDIA XL/ADALAT-CC) 60 MG 24 hr tablet Take 60 mg by mouth 2 (two) times daily.  . pravastatin (PRAVACHOL) 80 MG tablet TAKE 1 TABLET BY MOUTH EVERY DAY (Patient taking differently: TAKE 1 TABLET BY MOUTH EVERY DAY AT BEDTIME)  . predniSONE (DELTASONE) 5 MG tablet Take 5 mg by mouth daily with breakfast.  . sevelamer carbonate (RENVELA) 800 MG tablet Take 1 tablet (800 mg total) by mouth 3 (three) times daily with meals.  . tacrolimus (PROGRAF) 5 MG capsule Take 5 mg by mouth 2 (two) times daily.  . [DISCONTINUED] labetalol (NORMODYNE) 200 MG tablet Take 1 tablet (200 mg total) by mouth 2 (two) times daily.  . [DISCONTINUED] losartan (COZAAR) 25 MG tablet Take 1 tablet (25 mg total) by mouth daily.    Allergies  Allergen Reactions  . Azithromycin Nausea And Vomiting and Other (See Comments)    Chest tightness    Social History   Tobacco Use  . Smoking status: Never Smoker  . Smokeless tobacco: Never Used  Substance Use Topics  . Alcohol use: No  . Drug use: No   Social History   Social History Narrative   Divorced   Does Chiropodist work   3 years of college.   Does not drink, does not smoke.  Does not use illicit drugs.   Son in nursing school at [employer]    family history includes Alcohol abuse in his maternal grandfather; Cancer in his father; Heart disease in his maternal grandmother; Heart disease (age of onset: 46) in his mother; Hypertension in his mother and sister; Kidney cancer in his father; Learning disabilities in his paternal grandmother; Mental illness in his paternal grandmother; Stroke in his mother and paternal grandfather. M- CAD in 70s.  Wt Readings from Last 3 Encounters:  10/22/17 160 lb 3.2 oz (72.7 kg)  10/19/17 162 lb 0.6 oz (73.5 kg)  10/05/17 171 lb (77.6 kg)    PHYSICAL EXAM BP (!) 159/85   Pulse 84   Ht 5\' 9"  (1.753 m)   Wt 160 lb 3.2 oz (72.7 kg)    BMI 23.66 kg/m   Physical Exam  Constitutional: He is oriented to person, place, and time. He appears well-developed and well-nourished. No distress.  Well groomed.   HENT:  Head: Normocephalic and atraumatic.  Eyes: Scleral icterus: brown discoloration.  Neck: Normal range of motion. Neck supple. No JVD present.  Cardiovascular: Normal rate, regular rhythm, S1 normal, S2 normal and intact distal pulses.  Occasional extrasystoles are present. PMI is not displaced (cannot palpate). Exam reveals decreased pulses (difficult to palpate pedal pulses 2/2 edema). Exam reveals no gallop and no friction rub.  Murmur heard.  Systolic murmur is  present. Diffuse flow murmu heard throughout - cannot exclude referred bruit from fistula (RUE) Pulmonary/Chest: Effort normal and breath sounds normal. No respiratory distress. He has no wheezes. He has no rales.  Mildly decreased BS R>L lung base with dullness to percussion.  Mild crackles but no rales  Abdominal: Soft. Bowel sounds are normal. He exhibits no distension. There is no tenderness. There is no rebound.  Musculoskeletal: Normal range of motion. He exhibits edema (2+ ankle to knee swelling).  Neurological: He is alert and oriented to person, place, and time.  Psychiatric: He has a normal mood and affect. His behavior is normal. Judgment and thought content normal.  Vitals reviewed.    Adult ECG Report n/a  Other studies Reviewed: Additional studies/ records that were reviewed today include:  Recent Labs: June 10, 2017:  Total cholesterol 162, triglycerides 117, HDL 56, LDL 83.  A1c 6.7.  BUN/creatinine 31/5.47.   ASSESSMENT / PLAN: Problem List Items Addressed This Visit    Chronic diastolic heart failure, NYHA class 3 (HCC) (Chronic)    For now increase blood pressure control and really diastolic heart failure severe LVH and grade 2 diastolic function noted on echo.  This is certainly exacerbated by volume overload with missing  hemodialysis.  Concern now is for possible cardiac amyloid.  Will check dedicated nuclear amyloid testing - Tc-PYP scan.  Pending result, may need to consider advanced therapy options.   For now, we will push labetalol to 300 twice daily and losartan 50 mg daily.  Ability to push further.  The hydrating calcium channel blockers are not contraindicated with amyloidosis, however beta-blockers are of questionable benefit.  At this point however we need blood pressure control. He is also on Lasix in addition to hemodialysis per nephrology.       Relevant Medications   losartan (COZAAR) 50 MG tablet   labetalol (NORMODYNE) 300 MG tablet   DOE (dyspnea on exertion)    Evaluated with echocardiogram that does suggest significant diastolic dysfunction potentially restrictive evaluating granulosis, mostHeart disease with amyloid.  We are this is been done, I would like to also do ischemic evaluation with Myoview stress test.  Would need to space this out enough to avoid overlapping between the technetium study for amyloidosis and technician study for ischemia.      Hypertension (Chronic)    Still poorly controlled on current meds.  Plan: Increase labetalol to 300 mg twice daily and losartan 50 mg.  Would continue to push losartan to 100 mg at next visit if necessary. Also on clonidine which could also be increased.      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  CheckingTc-PYP scan   to determine + amyloidosis.  Continue to be aggressive with blood pressure control.  Plan: Increase labetalol to 300 mg twice daily and losartan to 50 mg daily      Relevant Medications   losartan (COZAAR) 50 MG tablet   labetalol (NORMODYNE) 300 MG tablet   Other Relevant Orders   MYOCARDIAL AMYLOID IMAGING PLANAR & SPECT       I  spent a total of 40 minutes with the patient and chart review. >  50% of the time was spent in direct patient consultation.   Current medicines are reviewed at length with the patient today.  (+/- concerns) he is  not sure if he is on good blood pressure medicines are not The following changes have been made:  No current changes until studies are performed. Patient Instructions  Medication Instructions:   INCREASE LOSARTAN 50 MG ONCE DAILY=2 OF THE 25 MG TABLETS ONCE DAILY  INCREASE LABETALOL TO 300 MG TWICE DAILY = 1 AND 1/2 OF THE 200 MG TABLETS TWICE DAILY  Testing/Procedures:  MYOCARDIAL AMYLOID IMAGING TEST  Follow-Up:  Your physician recommends that you schedule a follow-up appointment in: [place]   If you need a refill on your cardiac medications before your next appointment, please call your pharmacy.       Studies Ordered:   Orders Placed This Encounter  Procedures  . MYOCARDIAL AMYLOID IMAGING PLANAR & SPECT      Glenetta Hew, M.D., M.S. Interventional Cardiologist   Pager # [phone] Phone # [phone] [address] [place], [place] [address]   Thank you for choosing [facility] at [facility]!!

## 2017-10-22 NOTE — Patient Instructions (Addendum)
Medication Instructions:   INCREASE LOSARTAN 50 MG ONCE DAILY=2 OF THE 25 MG TABLETS ONCE DAILY  INCREASE LABETALOL TO 300 MG TWICE DAILY = 1 AND 1/2 OF THE 200 MG TABLETS TWICE DAILY  Testing/Procedures:  MYOCARDIAL AMYLOID IMAGING TEST  Follow-Up:  Your physician recommends that you schedule a follow-up appointment in: [place] [address] [facility]   If you need a refill on your cardiac medications before your next appointment, please call your pharmacy.

## 2017-10-24 ENCOUNTER — Encounter: Payer: Self-pay | Admitting: Cardiology

## 2017-10-24 DIAGNOSIS — E8770 Fluid overload, unspecified: Secondary | ICD-10-CM | POA: Diagnosis not present

## 2017-10-24 DIAGNOSIS — D649 Anemia, unspecified: Secondary | ICD-10-CM | POA: Diagnosis not present

## 2017-10-24 DIAGNOSIS — N186 End stage renal disease: Secondary | ICD-10-CM | POA: Diagnosis not present

## 2017-10-24 DIAGNOSIS — E876 Hypokalemia: Secondary | ICD-10-CM | POA: Diagnosis not present

## 2017-10-24 DIAGNOSIS — N2581 Secondary hyperparathyroidism of renal origin: Secondary | ICD-10-CM | POA: Diagnosis not present

## 2017-10-24 NOTE — Assessment & Plan Note (Addendum)
Evaluated with echocardiogram that does suggest significant diastolic dysfunction potentially restrictive evaluating granulosis, mostHeart disease with amyloid.  We are this is been done, I would like to also do ischemic evaluation with Myoview stress test.  Would need to space this out enough to avoid overlapping between the technetium study for amyloidosis and technician study for ischemia.

## 2017-10-24 NOTE — Assessment & Plan Note (Signed)
Was amyloidosis this may be related to the insufficiency.  CheckingTc-PYP scan to determine + amyloidosis.  Continue to be aggressive with blood pressure control.  Plan: Increase labetalol to 300 mg twice daily and losartan to 50 mg daily

## 2017-10-24 NOTE — Assessment & Plan Note (Addendum)
For now increase blood pressure control and really diastolic heart failure severe LVH and grade 2 diastolic function noted on echo.  This is certainly exacerbated by volume overload with missing hemodialysis.  Concern now is for possible cardiac amyloid.  Will check dedicated nuclear amyloid testing - Tc-PYP scan.  Pending result, may need to consider advanced therapy options.   For now, we will push labetalol to 300 twice daily and losartan 50 mg daily.  Ability to push further.  The hydrating calcium channel blockers are not contraindicated with amyloidosis, however beta-blockers are of questionable benefit.  At this point however we need blood pressure control. He is also on Lasix in addition to hemodialysis per nephrology.

## 2017-10-24 NOTE — Assessment & Plan Note (Signed)
Still poorly controlled on current meds.  Plan: Increase labetalol to 300 mg twice daily and losartan 50 mg.  Would continue to push losartan to 100 mg at next visit if necessary. Also on clonidine which could also be increased.

## 2017-10-26 ENCOUNTER — Telehealth (HOSPITAL_COMMUNITY): Payer: Self-pay | Admitting: *Deleted

## 2017-10-26 NOTE — Telephone Encounter (Signed)
Patient given detailed instructions per Myocardial Amyloid Study Information Sheet for the test on 10/28/17 at 1230. Patient notified to arrive 15 minutes early and that it is imperative to arrive on time for appointment to keep from having the test rescheduled.  If you need to cancel or reschedule your appointment, please call the office within 24 hours of your appointment. . Patient verbalized understanding.Emerita Berkemeier, Ranae Palms

## 2017-10-27 DIAGNOSIS — D649 Anemia, unspecified: Secondary | ICD-10-CM | POA: Diagnosis not present

## 2017-10-27 DIAGNOSIS — N2581 Secondary hyperparathyroidism of renal origin: Secondary | ICD-10-CM | POA: Diagnosis not present

## 2017-10-27 DIAGNOSIS — N186 End stage renal disease: Secondary | ICD-10-CM | POA: Diagnosis not present

## 2017-10-27 DIAGNOSIS — E876 Hypokalemia: Secondary | ICD-10-CM | POA: Diagnosis not present

## 2017-10-27 DIAGNOSIS — E8770 Fluid overload, unspecified: Secondary | ICD-10-CM | POA: Diagnosis not present

## 2017-10-28 ENCOUNTER — Ambulatory Visit (HOSPITAL_COMMUNITY): Payer: Medicare Other | Attending: Cardiology

## 2017-10-28 DIAGNOSIS — E854 Organ-limited amyloidosis: Secondary | ICD-10-CM | POA: Diagnosis not present

## 2017-10-28 DIAGNOSIS — I43 Cardiomyopathy in diseases classified elsewhere: Secondary | ICD-10-CM | POA: Diagnosis not present

## 2017-10-28 MED ORDER — TECHNETIUM TC 99M PYROPHOSPHATE
21.3000 | Freq: Once | INTRAVENOUS | Status: AC
  Administered 2017-10-28: 21.3 via INTRAVENOUS

## 2017-10-29 DIAGNOSIS — E876 Hypokalemia: Secondary | ICD-10-CM | POA: Diagnosis not present

## 2017-10-29 DIAGNOSIS — N2581 Secondary hyperparathyroidism of renal origin: Secondary | ICD-10-CM | POA: Diagnosis not present

## 2017-10-29 DIAGNOSIS — D649 Anemia, unspecified: Secondary | ICD-10-CM | POA: Diagnosis not present

## 2017-10-29 DIAGNOSIS — E8770 Fluid overload, unspecified: Secondary | ICD-10-CM | POA: Diagnosis not present

## 2017-10-29 DIAGNOSIS — N186 End stage renal disease: Secondary | ICD-10-CM | POA: Diagnosis not present

## 2017-10-30 ENCOUNTER — Encounter: Payer: Self-pay | Admitting: Pulmonary Disease

## 2017-10-30 ENCOUNTER — Other Ambulatory Visit (INDEPENDENT_AMBULATORY_CARE_PROVIDER_SITE_OTHER): Payer: Medicare Other

## 2017-10-30 ENCOUNTER — Ambulatory Visit (INDEPENDENT_AMBULATORY_CARE_PROVIDER_SITE_OTHER): Payer: Medicare Other | Admitting: Pulmonary Disease

## 2017-10-30 VITALS — BP 138/84 | HR 84 | Ht 69.0 in | Wt 166.0 lb

## 2017-10-30 DIAGNOSIS — R05 Cough: Secondary | ICD-10-CM

## 2017-10-30 DIAGNOSIS — R059 Cough, unspecified: Secondary | ICD-10-CM

## 2017-10-30 DIAGNOSIS — J90 Pleural effusion, not elsewhere classified: Secondary | ICD-10-CM

## 2017-10-30 LAB — CBC WITH DIFFERENTIAL/PLATELET
Basophils Absolute: 0.1 10*3/uL (ref 0.0–0.1)
Basophils Relative: 1 % (ref 0.0–3.0)
EOS PCT: 6.9 % — AB (ref 0.0–5.0)
Eosinophils Absolute: 0.6 10*3/uL (ref 0.0–0.7)
HCT: 29.3 % — ABNORMAL LOW (ref 39.0–52.0)
HEMOGLOBIN: 9.1 g/dL — AB (ref 13.0–17.0)
LYMPHS PCT: 10.4 % — AB (ref 12.0–46.0)
Lymphs Abs: 1 10*3/uL (ref 0.7–4.0)
MCHC: 31.1 g/dL (ref 30.0–36.0)
MCV: 75.1 fl — AB (ref 78.0–100.0)
MONOS PCT: 8.2 % (ref 3.0–12.0)
Monocytes Absolute: 0.8 10*3/uL (ref 0.1–1.0)
Neutro Abs: 6.8 10*3/uL (ref 1.4–7.7)
Neutrophils Relative %: 73.5 % (ref 43.0–77.0)
Platelets: 337 10*3/uL (ref 150.0–400.0)
RBC: 3.9 Mil/uL — AB (ref 4.22–5.81)
RDW: 22.7 % — ABNORMAL HIGH (ref 11.5–15.5)
WBC: 9.3 10*3/uL (ref 4.0–10.5)

## 2017-10-30 LAB — NITRIC OXIDE: NITRIC OXIDE: 46

## 2017-10-30 MED ORDER — CHLORPHENIRAMINE MALEATE 4 MG PO TABS: 8.0000 mg | ORAL_TABLET | Freq: Three times a day (TID) | ORAL | 0 refills | Status: DC

## 2017-10-30 MED ORDER — OMEPRAZOLE 20 MG PO CPDR: 20.0000 mg | DELAYED_RELEASE_CAPSULE | Freq: Every day | ORAL | 4 refills | Status: DC

## 2017-10-30 MED ORDER — FLUTICASONE PROPIONATE 50 MCG/ACT NA SUSP: 2.0000 | Freq: Every day | NASAL | 2 refills | Status: DC

## 2017-10-30 MED ORDER — AZELASTINE HCL 0.1 % NA SOLN: 1.0000 | Freq: Two times a day (BID) | NASAL | 3 refills | Status: DC

## 2017-10-30 NOTE — Progress Notes (Signed)
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  He has postnasal drip and has used Zyrtec without any improvement in symptoms.  Denies any dyspnea, wheezing, mucus production, fevers, chills  He had a recent hospitalization for acute on chronic diastolic heart failure, volume overload after missing dialysis by cardiology with echo showing significant diastolic heart failure and findings suggestive of amyloidosis.  He also has recurrent right effusion status post thoracentesis x2.  First time on 6/10 at [facility] and the second time on 7/29 at [facility].  Pleural fluid analysis was suggestive of transudative effusion.  Pets: No pets Occupation: Used to work for [employer].  Currently retired Exposures: No known exposures, no mold, hot tub Smoking history: Never smoker Travel history: Originally from [place].  No significant travel.  Outpatient Encounter Medications as of 10/30/2017  Medication Sig  . cloNIDine (CATAPRES) 0.1 MG tablet Take 1 tablet (0.1 mg total) by mouth 2 (two) times daily.  Marland Kitchen dextromethorphan-guaiFENesin (ROBITUSSIN-DM) 10-100 MG/5ML liquid Take 10 mLs by mouth every 12 (twelve) hours as needed for cough.  . furosemide (LASIX) 80 MG tablet Take 1 tablet (80 mg total) by mouth 2 (two) times daily.  Marland Kitchen guaiFENesin (MUCINEX) 600 MG 12 hr tablet Take 2 tablets (1,200 mg total) by mouth 2 (two) times daily. (Patient taking differently: Take 1,200 mg by mouth 2 (two) times daily as needed for cough. )  . labetalol (NORMODYNE) 300 MG tablet Take 1 tablet (300 mg total) by mouth 2 (two) times  daily.  Marland Kitchen loratadine (CLARITIN) 10 MG tablet Take 10 mg by mouth daily as needed for allergies.  Marland Kitchen losartan (COZAAR) 50 MG tablet Take 1 tablet (50 mg total) by mouth daily.  . multivitamin (RENA-VIT) TABS tablet Take 1 tablet by mouth daily.  Marland Kitchen NIFEdipine (PROCARDIA XL/ADALAT-CC) 60 MG 24 hr tablet Take 60 mg by mouth 2 (two) times daily.  . pravastatin (PRAVACHOL) 80 MG tablet TAKE 1 TABLET BY MOUTH EVERY DAY (Patient taking differently: TAKE 1 TABLET BY MOUTH EVERY DAY AT BEDTIME)  . predniSONE (DELTASONE) 5 MG tablet Take 5 mg by mouth daily with breakfast.  . sevelamer carbonate (RENVELA) 800 MG tablet Take 1 tablet (800 mg total) by mouth 3 (three) times daily with meals.  . tacrolimus (PROGRAF) 5 MG capsule Take 5 mg by mouth 2 (two) times daily.   No facility-administered encounter medications on file as of 10/30/2017.     Allergies as of 10/30/2017 - Review Complete 10/30/2017  Allergen Reaction Noted  . Azithromycin Nausea And Vomiting and Other (See Comments) 08/30/2017    Past Medical History:  Diagnosis Date  . Allergy   . Blood transfusion without reported diagnosis   . Dialysis patient ([place])    Tues,thurs,sat  . ESRD (end stage renal disease) ([place])   . Hyperlipidemia   . Hypertension     Past Surgical History:  Procedure Laterality Date  . A/V FISTULAGRAM Left 12/15/2016   Procedure: A/V Fistulagram - left;  Surgeon: Angelia Mould, MD;  Location: [place] [facility];  Service: Cardiovascular;  Laterality: Left;  . AV FISTULA PLACEMENT Left  08/28/2016   Procedure: LEFT UPPER  ARM ARTERIOVENOUS (AV) FISTULA CREATION;  Surgeon: Angelia Mould, MD;  Location: [place];  Service: Vascular;  Laterality: Left;  . FISTULA SUPERFICIALIZATION Left 09/23/2017   Procedure: FISTULA PLICATION LEFT ARM;  Surgeon: Elam Dutch, MD;  Location: [facility];  Service: Vascular;  Laterality: Left;  . IR THORACENTESIS ASP PLEURAL SPACE W/IMG GUIDE  08/22/2017   1.2 L -right-sided   . IR THORACENTESIS ASP PLEURAL SPACE W/IMG GUIDE  10/19/2017  . stent in kidneys     dec 2017  . TRANSTHORACIC ECHOCARDIOGRAM  09/22/2017    Severe LVH.  Normal function -EF 55-60%.  GRII DD.  Moderate RV dilation with mildly reduced RV function.   Fixed right coronary cusp with very mild aortic stenosis.  The myocardium has a speckled appearance --> .   Recommend cardiac MRI to evaluate for amyloid    Family History  Problem Relation Age of Onset  . Hypertension Mother   . Heart disease Mother 6       By his report, he thinks that she had heart attack.  . Stroke Mother   . Cancer Father   . Kidney cancer Father   . Hypertension Sister   . Heart disease Maternal Grandmother   . Alcohol abuse Maternal Grandfather   . Mental illness Paternal Grandmother   . Learning disabilities Paternal Grandmother        Alzheimer's   . Stroke Paternal Grandfather   . Colon cancer Neg Hx   . Colon polyps Neg Hx   . Esophageal cancer Neg Hx   . Rectal cancer Neg Hx   . Stomach cancer Neg Hx     Social History   Socioeconomic History  . Marital status: Divorced    Spouse name: Not on file  . Number of children: Not on file  . Years of education: Not on file  . Highest education level: Not on file  Occupational History  . Not on file  Social Needs  . Financial resource strain: Not on file  . Food insecurity:    Worry: Not on file    Inability: Not on file  . Transportation needs:    Medical: Not on file    Non-medical: Not on file  Tobacco Use  . Smoking status: Never Smoker  . Smokeless tobacco: Never Used  Substance and Sexual Activity  . Alcohol use: No  . Drug use: No  . Sexual activity: Not Currently    Birth control/protection: None  Lifestyle  . Physical activity:    Days per week: Not on file    Minutes per session: Not on file  . Stress: Not on file  Relationships  . Social connections:    Talks on phone: Not on file    Gets together: Not on file    Attends  religious service: Not on file    Active member of club or organization: Not on file    Attends meetings of clubs or organizations: Not on file    Relationship status: Not on file  . Intimate partner violence:    Fear of current or ex partner: Not on file    Emotionally abused: Not on file    Physically abused: Not on file    Forced sexual activity: Not on file  Other Topics Concern  . Not on file  Social History Narrative   Divorced   Does Chiropodist work   3 years of college.   Does  not drink, does not smoke.  Does not use illicit drugs.   Son in nursing school at [place] of systems: Review of Systems  Constitutional: Negative for fever and chills.  HENT: Negative.   Eyes: Negative for blurred vision.  Respiratory: as per HPI  Cardiovascular: Negative for chest pain and palpitations.  Gastrointestinal: Negative for vomiting, diarrhea, blood per rectum. Genitourinary: Negative for dysuria, urgency, frequency and hematuria.  Musculoskeletal: Negative for myalgias, back pain and joint pain.  Skin: Negative for itching and rash.  Neurological: Negative for dizziness, tremors, focal weakness, seizures and loss of consciousness.  Endo/Heme/Allergies: Negative for environmental allergies.  Psychiatric/Behavioral: Negative for depression, suicidal ideas and hallucinations.  All other systems reviewed and are negative.  Physical Exam: Blood pressure 138/84, pulse 84, height 5\' 9"  (1.753 m), weight 166 lb (75.3 kg), SpO2 99 %. Gen:      No acute distress HEENT:  EOMI, sclera anicteric Neck:     No masses; no thyromegaly Lungs:    Clear to auscultation bilaterally; normal respiratory effort CV:         Regular rate and rhythm; no murmurs Abd:      + bowel sounds; soft, non-tender; no palpable masses, no distension Ext:    No edema; adequate peripheral perfusion Skin:      Warm and dry; no rash Neuro: alert and oriented x 3 Psych: normal mood and affect  Data  Reviewed: FENO 46  Chest x-ray 10/18/2017- right-sided effusion with basilar infiltrate. Chest x-ray 10/19/2017- decrease in right pleural effusion postthoracentesis.  Mild central pulmonary vascular engorgement.  I have reviewed the images personally.  Echo 09/22/2017-  Normal LV systolic function; severe LVH; myocardium with speckled   appearance; fixed right coronary cusp with very mild AS (mean   gradient 10 mmHg) and mild AI; mildly dilated aortic   root/ascending aorta; mild biatrial enlargement; moderate RVE   with moderate RV dysfunction; small pericardial effusion;   findings suggestive of amyloid; suggest cardiac MRI or Tc-PYP   scan to further assess.  Pleural fluid studies 10/19/17 LDH 68, total protein less than 3 WBC 495, 51% lymphs, 40% monocyte macrophage Micro, Cytology- negative  Assessment:  Recurrent right pleural effusion Underwent thoracentesis x2 Labs are suggestive of transudative effusion from heart failure, kidney failure Continue monitoring.  Cough Likely upper airway secondary to postnasal drip, GERD We will treat with chlorpheniramine 8 mg 3 times daily, Flonase, Astelin nasal spray Start Prilosec 20 mg twice daily for 2 weeks  Elevated FENO suggestive of reactive airway disease, asthma Check CBC differential, IgE and PFTs for evaluation  Plan/Recommendations: - Chlorpheniramine, Flonase, - Start Prilosec - CBC differential, IgE, PFTs  Marshell Garfinkel MD [facility] 10/30/2017, 9:41 AM  CC: Orma Flaming, MD

## 2017-10-30 NOTE — Assessment & Plan Note (Signed)
Likely upper airway secondary to postnasal drip, GERD We will treat with chlorpheniramine 8 mg 3 times daily, Flonase, Astelin nasal spray Start Prilosec 20 mg twice daily for 2 weeks  Elevated FENO suggestive of reactive airway disease, asthma Check CBC differential, IgE and PFTs for evaluation

## 2017-10-30 NOTE — Patient Instructions (Addendum)
We will schedule you for pulmonary function test, CBC differential, IgE Start chlorpheniramine 8 mg 3 times daily, Flonase and Astelin nasal spray Start Prilosec 20 mg twice daily  Follow-up in 1 to 2 months.

## 2017-10-30 NOTE — Assessment & Plan Note (Signed)
Underwent thoracentesis x2 Labs are suggestive of transudative effusion from heart failure, kidney failure Continue monitoring.

## 2017-11-02 DIAGNOSIS — N2581 Secondary hyperparathyroidism of renal origin: Secondary | ICD-10-CM | POA: Diagnosis not present

## 2017-11-02 DIAGNOSIS — D649 Anemia, unspecified: Secondary | ICD-10-CM | POA: Diagnosis not present

## 2017-11-02 DIAGNOSIS — N186 End stage renal disease: Secondary | ICD-10-CM | POA: Diagnosis not present

## 2017-11-02 DIAGNOSIS — E8770 Fluid overload, unspecified: Secondary | ICD-10-CM | POA: Diagnosis not present

## 2017-11-02 DIAGNOSIS — E876 Hypokalemia: Secondary | ICD-10-CM | POA: Diagnosis not present

## 2017-11-02 LAB — IGE: IgE (Immunoglobulin E), Serum: 42 kU/L (ref <=114)

## 2017-11-03 ENCOUNTER — Telehealth: Payer: Self-pay | Admitting: *Deleted

## 2017-11-03 DIAGNOSIS — N2581 Secondary hyperparathyroidism of renal origin: Secondary | ICD-10-CM | POA: Diagnosis not present

## 2017-11-03 DIAGNOSIS — N186 End stage renal disease: Secondary | ICD-10-CM | POA: Diagnosis not present

## 2017-11-03 DIAGNOSIS — E8770 Fluid overload, unspecified: Secondary | ICD-10-CM | POA: Diagnosis not present

## 2017-11-03 DIAGNOSIS — D649 Anemia, unspecified: Secondary | ICD-10-CM | POA: Diagnosis not present

## 2017-11-03 DIAGNOSIS — E876 Hypokalemia: Secondary | ICD-10-CM | POA: Diagnosis not present

## 2017-11-03 NOTE — Telephone Encounter (Signed)
LEFT DETAIL MESSAGE OF RESULTS ON VOICEMAIL PER DPI  ALSO RESULT RELEASED TO MYCHART , KEEP APPOINTMENT 01/27/18\ ANY QUESTION MAY CALL BACK

## 2017-11-03 NOTE — Telephone Encounter (Signed)
-----   Message from Minus Breeding, MD sent at 11/02/2017  4:54 PM EDT ----- Normal.  No evidence to suggest transthyretin amyloidosis.   Further management and testing per Dr. Ellyn Hack.

## 2017-11-05 DIAGNOSIS — E8770 Fluid overload, unspecified: Secondary | ICD-10-CM | POA: Diagnosis not present

## 2017-11-05 DIAGNOSIS — N186 End stage renal disease: Secondary | ICD-10-CM | POA: Diagnosis not present

## 2017-11-05 DIAGNOSIS — D649 Anemia, unspecified: Secondary | ICD-10-CM | POA: Diagnosis not present

## 2017-11-05 DIAGNOSIS — E876 Hypokalemia: Secondary | ICD-10-CM | POA: Diagnosis not present

## 2017-11-05 DIAGNOSIS — N2581 Secondary hyperparathyroidism of renal origin: Secondary | ICD-10-CM | POA: Diagnosis not present

## 2017-11-06 DIAGNOSIS — E8770 Fluid overload, unspecified: Secondary | ICD-10-CM | POA: Diagnosis not present

## 2017-11-06 DIAGNOSIS — N2581 Secondary hyperparathyroidism of renal origin: Secondary | ICD-10-CM | POA: Diagnosis not present

## 2017-11-06 DIAGNOSIS — N186 End stage renal disease: Secondary | ICD-10-CM | POA: Diagnosis not present

## 2017-11-06 DIAGNOSIS — D649 Anemia, unspecified: Secondary | ICD-10-CM | POA: Diagnosis not present

## 2017-11-06 DIAGNOSIS — E876 Hypokalemia: Secondary | ICD-10-CM | POA: Diagnosis not present

## 2017-11-09 ENCOUNTER — Encounter: Payer: Medicare Other | Admitting: Hematology and Oncology

## 2017-11-09 NOTE — Assessment & Plan Note (Deleted)
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   Recent hospitalizations: Right pleural effusion drained 08/31/2017 at [facility], negative cytology 10/18/2017: Shortness of breath due to pleural effusion  Lab review: 10/30/2017: Hemoglobin 9.1, MCV 75, platelets 337 Work-up recommended: Iron studies If the patient requires iron will administer intravenous iron therapy.  Suspicion regarding cardiac amyloidosis: I recommended that the patient follow with cardiology to see if that diagnosis is correct.  I will obtain serum protein electrophoresis.  It is most likely to be normal even if the patient had amyloidosis.  If amyloidosis is high on the suspicion then a fat pad biopsy or a cardiac muscle biopsy would be preferable.

## 2017-11-10 DIAGNOSIS — E876 Hypokalemia: Secondary | ICD-10-CM | POA: Diagnosis not present

## 2017-11-10 DIAGNOSIS — N186 End stage renal disease: Secondary | ICD-10-CM | POA: Diagnosis not present

## 2017-11-10 DIAGNOSIS — E8770 Fluid overload, unspecified: Secondary | ICD-10-CM | POA: Diagnosis not present

## 2017-11-10 DIAGNOSIS — N2581 Secondary hyperparathyroidism of renal origin: Secondary | ICD-10-CM | POA: Diagnosis not present

## 2017-11-10 DIAGNOSIS — D649 Anemia, unspecified: Secondary | ICD-10-CM | POA: Diagnosis not present

## 2017-11-12 DIAGNOSIS — N186 End stage renal disease: Secondary | ICD-10-CM | POA: Diagnosis not present

## 2017-11-12 DIAGNOSIS — E876 Hypokalemia: Secondary | ICD-10-CM | POA: Diagnosis not present

## 2017-11-12 DIAGNOSIS — N2581 Secondary hyperparathyroidism of renal origin: Secondary | ICD-10-CM | POA: Diagnosis not present

## 2017-11-12 DIAGNOSIS — E8770 Fluid overload, unspecified: Secondary | ICD-10-CM | POA: Diagnosis not present

## 2017-11-12 DIAGNOSIS — D649 Anemia, unspecified: Secondary | ICD-10-CM | POA: Diagnosis not present

## 2017-11-14 DIAGNOSIS — N2581 Secondary hyperparathyroidism of renal origin: Secondary | ICD-10-CM | POA: Diagnosis not present

## 2017-11-14 DIAGNOSIS — D649 Anemia, unspecified: Secondary | ICD-10-CM | POA: Diagnosis not present

## 2017-11-14 DIAGNOSIS — N186 End stage renal disease: Secondary | ICD-10-CM | POA: Diagnosis not present

## 2017-11-14 DIAGNOSIS — E876 Hypokalemia: Secondary | ICD-10-CM | POA: Diagnosis not present

## 2017-11-14 DIAGNOSIS — E8770 Fluid overload, unspecified: Secondary | ICD-10-CM | POA: Diagnosis not present

## 2017-11-17 DIAGNOSIS — E876 Hypokalemia: Secondary | ICD-10-CM | POA: Diagnosis not present

## 2017-11-17 DIAGNOSIS — E8770 Fluid overload, unspecified: Secondary | ICD-10-CM | POA: Diagnosis not present

## 2017-11-17 DIAGNOSIS — N186 End stage renal disease: Secondary | ICD-10-CM | POA: Diagnosis not present

## 2017-11-17 DIAGNOSIS — N2581 Secondary hyperparathyroidism of renal origin: Secondary | ICD-10-CM | POA: Diagnosis not present

## 2017-11-17 DIAGNOSIS — D649 Anemia, unspecified: Secondary | ICD-10-CM | POA: Diagnosis not present

## 2017-11-19 DIAGNOSIS — D649 Anemia, unspecified: Secondary | ICD-10-CM | POA: Diagnosis not present

## 2017-11-19 DIAGNOSIS — N2581 Secondary hyperparathyroidism of renal origin: Secondary | ICD-10-CM | POA: Diagnosis not present

## 2017-11-19 DIAGNOSIS — N186 End stage renal disease: Secondary | ICD-10-CM | POA: Diagnosis not present

## 2017-11-19 DIAGNOSIS — E8770 Fluid overload, unspecified: Secondary | ICD-10-CM | POA: Diagnosis not present

## 2017-11-19 DIAGNOSIS — E876 Hypokalemia: Secondary | ICD-10-CM | POA: Diagnosis not present

## 2017-11-20 DIAGNOSIS — N186 End stage renal disease: Secondary | ICD-10-CM | POA: Diagnosis not present

## 2017-11-20 DIAGNOSIS — E8770 Fluid overload, unspecified: Secondary | ICD-10-CM | POA: Diagnosis not present

## 2017-11-20 DIAGNOSIS — D649 Anemia, unspecified: Secondary | ICD-10-CM | POA: Diagnosis not present

## 2017-11-20 DIAGNOSIS — E876 Hypokalemia: Secondary | ICD-10-CM | POA: Diagnosis not present

## 2017-11-20 DIAGNOSIS — N2581 Secondary hyperparathyroidism of renal origin: Secondary | ICD-10-CM | POA: Diagnosis not present

## 2017-11-21 DIAGNOSIS — N2581 Secondary hyperparathyroidism of renal origin: Secondary | ICD-10-CM | POA: Diagnosis not present

## 2017-11-21 DIAGNOSIS — E876 Hypokalemia: Secondary | ICD-10-CM | POA: Diagnosis not present

## 2017-11-21 DIAGNOSIS — D649 Anemia, unspecified: Secondary | ICD-10-CM | POA: Diagnosis not present

## 2017-11-21 DIAGNOSIS — N186 End stage renal disease: Secondary | ICD-10-CM | POA: Diagnosis not present

## 2017-11-21 DIAGNOSIS — E8770 Fluid overload, unspecified: Secondary | ICD-10-CM | POA: Diagnosis not present

## 2017-11-22 DIAGNOSIS — N186 End stage renal disease: Secondary | ICD-10-CM | POA: Diagnosis not present

## 2017-11-22 DIAGNOSIS — Z992 Dependence on renal dialysis: Secondary | ICD-10-CM | POA: Diagnosis not present

## 2017-11-22 DIAGNOSIS — T8612 Kidney transplant failure: Secondary | ICD-10-CM | POA: Diagnosis not present

## 2017-11-24 DIAGNOSIS — Z23 Encounter for immunization: Secondary | ICD-10-CM | POA: Diagnosis not present

## 2017-11-24 DIAGNOSIS — N186 End stage renal disease: Secondary | ICD-10-CM | POA: Diagnosis not present

## 2017-11-24 DIAGNOSIS — N2581 Secondary hyperparathyroidism of renal origin: Secondary | ICD-10-CM | POA: Diagnosis not present

## 2017-11-24 DIAGNOSIS — D649 Anemia, unspecified: Secondary | ICD-10-CM | POA: Diagnosis not present

## 2017-11-24 DIAGNOSIS — E876 Hypokalemia: Secondary | ICD-10-CM | POA: Diagnosis not present

## 2017-11-26 DIAGNOSIS — D649 Anemia, unspecified: Secondary | ICD-10-CM | POA: Diagnosis not present

## 2017-11-26 DIAGNOSIS — N2581 Secondary hyperparathyroidism of renal origin: Secondary | ICD-10-CM | POA: Diagnosis not present

## 2017-11-26 DIAGNOSIS — Z23 Encounter for immunization: Secondary | ICD-10-CM | POA: Diagnosis not present

## 2017-11-26 DIAGNOSIS — E876 Hypokalemia: Secondary | ICD-10-CM | POA: Diagnosis not present

## 2017-11-26 DIAGNOSIS — N186 End stage renal disease: Secondary | ICD-10-CM | POA: Diagnosis not present

## 2017-11-28 DIAGNOSIS — N2581 Secondary hyperparathyroidism of renal origin: Secondary | ICD-10-CM | POA: Diagnosis not present

## 2017-11-28 DIAGNOSIS — Z23 Encounter for immunization: Secondary | ICD-10-CM | POA: Diagnosis not present

## 2017-11-28 DIAGNOSIS — D649 Anemia, unspecified: Secondary | ICD-10-CM | POA: Diagnosis not present

## 2017-11-28 DIAGNOSIS — N186 End stage renal disease: Secondary | ICD-10-CM | POA: Diagnosis not present

## 2017-11-28 DIAGNOSIS — E876 Hypokalemia: Secondary | ICD-10-CM | POA: Diagnosis not present

## 2017-12-01 ENCOUNTER — Other Ambulatory Visit: Payer: Self-pay | Admitting: Physician Assistant

## 2017-12-01 DIAGNOSIS — D649 Anemia, unspecified: Secondary | ICD-10-CM | POA: Diagnosis not present

## 2017-12-01 DIAGNOSIS — N2581 Secondary hyperparathyroidism of renal origin: Secondary | ICD-10-CM | POA: Diagnosis not present

## 2017-12-01 DIAGNOSIS — E876 Hypokalemia: Secondary | ICD-10-CM | POA: Diagnosis not present

## 2017-12-01 DIAGNOSIS — N186 End stage renal disease: Secondary | ICD-10-CM | POA: Diagnosis not present

## 2017-12-01 DIAGNOSIS — Z23 Encounter for immunization: Secondary | ICD-10-CM | POA: Diagnosis not present

## 2017-12-03 DIAGNOSIS — N2581 Secondary hyperparathyroidism of renal origin: Secondary | ICD-10-CM | POA: Diagnosis not present

## 2017-12-03 DIAGNOSIS — D649 Anemia, unspecified: Secondary | ICD-10-CM | POA: Diagnosis not present

## 2017-12-03 DIAGNOSIS — N186 End stage renal disease: Secondary | ICD-10-CM | POA: Diagnosis not present

## 2017-12-03 DIAGNOSIS — Z23 Encounter for immunization: Secondary | ICD-10-CM | POA: Diagnosis not present

## 2017-12-03 DIAGNOSIS — E876 Hypokalemia: Secondary | ICD-10-CM | POA: Diagnosis not present

## 2017-12-05 DIAGNOSIS — N2581 Secondary hyperparathyroidism of renal origin: Secondary | ICD-10-CM | POA: Diagnosis not present

## 2017-12-05 DIAGNOSIS — E876 Hypokalemia: Secondary | ICD-10-CM | POA: Diagnosis not present

## 2017-12-05 DIAGNOSIS — D649 Anemia, unspecified: Secondary | ICD-10-CM | POA: Diagnosis not present

## 2017-12-05 DIAGNOSIS — Z23 Encounter for immunization: Secondary | ICD-10-CM | POA: Diagnosis not present

## 2017-12-05 DIAGNOSIS — N186 End stage renal disease: Secondary | ICD-10-CM | POA: Diagnosis not present

## 2017-12-07 ENCOUNTER — Other Ambulatory Visit: Payer: Self-pay

## 2017-12-07 DIAGNOSIS — Z992 Dependence on renal dialysis: Principal | ICD-10-CM

## 2017-12-07 DIAGNOSIS — N186 End stage renal disease: Secondary | ICD-10-CM

## 2017-12-07 DIAGNOSIS — I721 Aneurysm of artery of upper extremity: Secondary | ICD-10-CM

## 2017-12-08 DIAGNOSIS — D649 Anemia, unspecified: Secondary | ICD-10-CM | POA: Diagnosis not present

## 2017-12-08 DIAGNOSIS — Z23 Encounter for immunization: Secondary | ICD-10-CM | POA: Diagnosis not present

## 2017-12-08 DIAGNOSIS — N2581 Secondary hyperparathyroidism of renal origin: Secondary | ICD-10-CM | POA: Diagnosis not present

## 2017-12-08 DIAGNOSIS — E876 Hypokalemia: Secondary | ICD-10-CM | POA: Diagnosis not present

## 2017-12-08 DIAGNOSIS — N186 End stage renal disease: Secondary | ICD-10-CM | POA: Diagnosis not present

## 2017-12-10 DIAGNOSIS — D649 Anemia, unspecified: Secondary | ICD-10-CM | POA: Diagnosis not present

## 2017-12-10 DIAGNOSIS — N2581 Secondary hyperparathyroidism of renal origin: Secondary | ICD-10-CM | POA: Diagnosis not present

## 2017-12-10 DIAGNOSIS — N186 End stage renal disease: Secondary | ICD-10-CM | POA: Diagnosis not present

## 2017-12-10 DIAGNOSIS — Z23 Encounter for immunization: Secondary | ICD-10-CM | POA: Diagnosis not present

## 2017-12-10 DIAGNOSIS — E876 Hypokalemia: Secondary | ICD-10-CM | POA: Diagnosis not present

## 2017-12-12 DIAGNOSIS — Z23 Encounter for immunization: Secondary | ICD-10-CM | POA: Diagnosis not present

## 2017-12-12 DIAGNOSIS — N186 End stage renal disease: Secondary | ICD-10-CM | POA: Diagnosis not present

## 2017-12-12 DIAGNOSIS — N2581 Secondary hyperparathyroidism of renal origin: Secondary | ICD-10-CM | POA: Diagnosis not present

## 2017-12-12 DIAGNOSIS — D649 Anemia, unspecified: Secondary | ICD-10-CM | POA: Diagnosis not present

## 2017-12-12 DIAGNOSIS — E876 Hypokalemia: Secondary | ICD-10-CM | POA: Diagnosis not present

## 2017-12-14 ENCOUNTER — Ambulatory Visit (INDEPENDENT_AMBULATORY_CARE_PROVIDER_SITE_OTHER): Payer: Self-pay | Admitting: Physician Assistant

## 2017-12-14 ENCOUNTER — Other Ambulatory Visit: Payer: Self-pay | Admitting: *Deleted

## 2017-12-14 ENCOUNTER — Encounter: Payer: Self-pay | Admitting: *Deleted

## 2017-12-14 ENCOUNTER — Ambulatory Visit (HOSPITAL_COMMUNITY)
Admission: RE | Admit: 2017-12-14 | Discharge: 2017-12-14 | Disposition: A | Discharge status: Home / Self Care | Payer: Medicare Other | Source: Ambulatory Visit | Attending: Surgery | Admitting: Surgery

## 2017-12-14 ENCOUNTER — Other Ambulatory Visit: Payer: Self-pay

## 2017-12-14 VITALS — BP 165/92 | HR 83 | Temp 97.7°F | Resp 16 | Ht 69.0 in | Wt 177.0 lb

## 2017-12-14 DIAGNOSIS — I721 Aneurysm of artery of upper extremity: Secondary | ICD-10-CM | POA: Diagnosis not present

## 2017-12-14 DIAGNOSIS — N186 End stage renal disease: Secondary | ICD-10-CM

## 2017-12-14 DIAGNOSIS — Z992 Dependence on renal dialysis: Secondary | ICD-10-CM | POA: Diagnosis not present

## 2017-12-14 NOTE — Progress Notes (Signed)
    Postoperative Access Visit   History of Present Illness   Ryan [name] is a 57 y.o. year old male who presents for postoperative follow-up for: left arm fistula plication by Dr. Oneida Alar (Date: 09/23/17).  The patient's wounds are healed.  However the patient returns to clinic due to rapidly expanding pseudoaneurysms in mid and upper left arm segments of fistula and has had prolonged bleeding from needle sites after dialysis over the past several weeks.  He has had history of venous outflow stenosis requiring balloon angioplasty most recently in June of this year.  Patient states they are able to complete hemodialysis treatments and are using the area of the fistula that was previously plicated without any complication.  He states his left upper arm has been feeling more and more tight recently and is interested in additional plication of fistula.  He dialyzes on a Tuesday Thursday Saturday schedule.  He does not take any blood thinners.  Physical Examination   Vitals:   12/14/17 1420  BP: (!) 165/92  Pulse: 83  Resp: 16  Temp: 97.7 F (36.5 C)  TempSrc: Oral  SpO2: 95%  Weight: 177 lb (80.3 kg)  Height: 5\' 9"  (1.753 m)   Body mass index is 26.14 kg/m.  left arm Incision is healed, hand grip is 5/5, sensation in digits is intact, palpable thrill, bruit can be auscultated; palpable L radial pulse; 2 large pseudoaneurysms in mid and upper left arm segments of fistula; some mild thinning skin in pseudoaneurysm adjacent to axilla    Medical Decision Making   Ryan [name] is a 57 y.o. year old male who presents s/p left Arm AV fistula plication   Prolonged bleeding and enlarging pseudoaneurysms L arm AV fistula suggesting recurrent outflow stenosis  Patient will likely require L arm fistulogram with possible revascularization as well as additional plication of L arm AV fistula  I explained to the patient that this could potentially be done in a staged fashion or  performed at the same time depending on the surgeon  Patient is also aware of risks associated with this surgery including bleeding, infection, thrombosis, need for new access, need for additional procedures,as well as potential need for [facility] placement.  He is willing to proceed.  Fistulogram with possible revascularization as well as plication of AV fistula with possible TDC placement scheduled for 12/21/17 with Dr. Oneida Alar.  This case will be discussed with Dr. Oneida Alar.   Dagoberto Ligas PA-C [facility] Office: [phone]

## 2017-12-14 NOTE — H&P (View-Only) (Signed)
    Postoperative Access Visit   History of Present Illness   Ryan [name] is a 57 y.o. year old male who presents for postoperative follow-up for: left arm fistula plication by Dr. Oneida Alar (Date: 09/23/17).  The patient's wounds are healed.  However the patient returns to clinic due to rapidly expanding pseudoaneurysms in mid and upper left arm segments of fistula and has had prolonged bleeding from needle sites after dialysis over the past several weeks.  He has had history of venous outflow stenosis requiring balloon angioplasty most recently in June of this year.  Patient states they are able to complete hemodialysis treatments and are using the area of the fistula that was previously plicated without any complication.  He states his left upper arm has been feeling more and more tight recently and is interested in additional plication of fistula.  He dialyzes on a Tuesday Thursday Saturday schedule.  He does not take any blood thinners.  Physical Examination   Vitals:   12/14/17 1420  BP: (!) 165/92  Pulse: 83  Resp: 16  Temp: 97.7 F (36.5 C)  TempSrc: Oral  SpO2: 95%  Weight: 177 lb (80.3 kg)  Height: 5\' 9"  (1.753 m)   Body mass index is 26.14 kg/m.  left arm Incision is healed, hand grip is 5/5, sensation in digits is intact, palpable thrill, bruit can be auscultated; palpable L radial pulse; 2 large pseudoaneurysms in mid and upper left arm segments of fistula; some mild thinning skin in pseudoaneurysm adjacent to axilla    Medical Decision Making   Ryan [name] is a 57 y.o. year old male who presents s/p left Arm AV fistula plication   Prolonged bleeding and enlarging pseudoaneurysms L arm AV fistula suggesting recurrent outflow stenosis  Patient will likely require L arm fistulogram with possible revascularization as well as additional plication of L arm AV fistula  I explained to the patient that this could potentially be done in a staged fashion or  performed at the same time depending on the surgeon  Patient is also aware of risks associated with this surgery including bleeding, infection, thrombosis, need for new access, need for additional procedures,as well as potential need for [facility] placement.  He is willing to proceed.  Fistulogram with possible revascularization as well as plication of AV fistula with possible TDC placement scheduled for 12/21/17 with Dr. Oneida Alar.  This case will be discussed with Dr. Oneida Alar.   Dagoberto Ligas PA-C [facility] Office: [phone]

## 2017-12-15 DIAGNOSIS — N2581 Secondary hyperparathyroidism of renal origin: Secondary | ICD-10-CM | POA: Diagnosis not present

## 2017-12-15 DIAGNOSIS — N186 End stage renal disease: Secondary | ICD-10-CM | POA: Diagnosis not present

## 2017-12-15 DIAGNOSIS — D649 Anemia, unspecified: Secondary | ICD-10-CM | POA: Diagnosis not present

## 2017-12-15 DIAGNOSIS — Z23 Encounter for immunization: Secondary | ICD-10-CM | POA: Diagnosis not present

## 2017-12-15 DIAGNOSIS — E876 Hypokalemia: Secondary | ICD-10-CM | POA: Diagnosis not present

## 2017-12-16 DIAGNOSIS — N2581 Secondary hyperparathyroidism of renal origin: Secondary | ICD-10-CM | POA: Diagnosis not present

## 2017-12-16 DIAGNOSIS — E8770 Fluid overload, unspecified: Secondary | ICD-10-CM | POA: Diagnosis not present

## 2017-12-16 DIAGNOSIS — N186 End stage renal disease: Secondary | ICD-10-CM | POA: Diagnosis not present

## 2017-12-17 DIAGNOSIS — D649 Anemia, unspecified: Secondary | ICD-10-CM | POA: Diagnosis not present

## 2017-12-17 DIAGNOSIS — N186 End stage renal disease: Secondary | ICD-10-CM | POA: Diagnosis not present

## 2017-12-17 DIAGNOSIS — Z23 Encounter for immunization: Secondary | ICD-10-CM | POA: Diagnosis not present

## 2017-12-17 DIAGNOSIS — E876 Hypokalemia: Secondary | ICD-10-CM | POA: Diagnosis not present

## 2017-12-17 DIAGNOSIS — N2581 Secondary hyperparathyroidism of renal origin: Secondary | ICD-10-CM | POA: Diagnosis not present

## 2017-12-18 NOTE — Progress Notes (Signed)
I was unable to reach patient by phone.  I left  A message on voice mail.  I instructed the patient to arrive at [facility] entrance at 5:30 AM   , nothing to eat or drink after midnight.   I instructed the patient to take the following medications in the am with just enough water to get them down: Clonidine, Labetalol, Nifedipine, Prograf. Prn Robitussin, Clartin.   I asked patient to not wear any lotions, powders, cologne, jewelry, piercing, make-up or nail polish. Shower , wear clean clothes. You will need some one to drive you home and stay with you for the first 24 hours. I asked the patient to call [phone], in the am if there were any questions or problems.

## 2017-12-19 DIAGNOSIS — N186 End stage renal disease: Secondary | ICD-10-CM | POA: Diagnosis not present

## 2017-12-19 DIAGNOSIS — N2581 Secondary hyperparathyroidism of renal origin: Secondary | ICD-10-CM | POA: Diagnosis not present

## 2017-12-19 DIAGNOSIS — D649 Anemia, unspecified: Secondary | ICD-10-CM | POA: Diagnosis not present

## 2017-12-19 DIAGNOSIS — Z23 Encounter for immunization: Secondary | ICD-10-CM | POA: Diagnosis not present

## 2017-12-19 DIAGNOSIS — E876 Hypokalemia: Secondary | ICD-10-CM | POA: Diagnosis not present

## 2017-12-20 NOTE — Anesthesia Preprocedure Evaluation (Addendum)
Anesthesia Evaluation  Patient identified by MRN, date of birth, ID band Patient awake    Reviewed: Allergy & Precautions, NPO status , Patient's Chart, lab work & pertinent test results, reviewed documented beta blocker date and time   Airway Mallampati: II  TM Distance: >3 FB Neck ROM: Full    Dental  (+) Dental Advisory Given, Teeth Intact   Pulmonary  Possible OSA (undiagnosed)    Pulmonary exam normal breath sounds clear to auscultation       Cardiovascular hypertension, Pt. on medications and Pt. on home beta blockers + PND and + DOE  Normal cardiovascular exam Rhythm:Regular Rate:Normal  '19 TTE - Severe LVH. EF 55% to 60%. Grade 2 diastolic  dysfunction. Very mild AS, mild AI. Mildly dilated aortic root and ascending aorta. Mildly dilated LA. Moderately dilated RV with moderate systolic function reduction. Mildly dilated RA. A small pericardial effusion was identified.   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     Musculoskeletal   Abdominal   Peds  Hematology  (+) anemia ,   Anesthesia Other Findings   Reproductive/Obstetrics                            Anesthesia Physical  Anesthesia Plan  ASA: III  Anesthesia Plan:    Post-op Pain Management:    Induction: Intravenous  PONV Risk Score and Plan: 1 and Propofol infusion and Treatment may vary due to age or medical condition  Airway Management Planned:   Additional Equipment: None  Intra-op Plan:   Post-operative Plan:   Informed Consent: I have reviewed the patients History and Physical, chart, labs and discussed the procedure including the risks, benefits and alternatives for the proposed anesthesia with the patient or authorized representative who has indicated his/her  understanding and acceptance.   Dental advisory given  Plan Discussed with: CRNA  Anesthesia Plan Comments:         Anesthesia Quick Evaluation

## 2017-12-21 ENCOUNTER — Encounter (HOSPITAL_COMMUNITY)
Admission: RE | Disposition: A | Discharge status: Home / Self Care | Payer: Self-pay | Source: Ambulatory Visit | Attending: Vascular Surgery

## 2017-12-21 ENCOUNTER — Encounter (HOSPITAL_COMMUNITY): Payer: Self-pay | Admitting: Anesthesiology

## 2017-12-21 ENCOUNTER — Ambulatory Visit (HOSPITAL_COMMUNITY): Payer: Medicare Other | Admitting: Anesthesiology

## 2017-12-21 ENCOUNTER — Ambulatory Visit (HOSPITAL_COMMUNITY)
Admission: RE | Admit: 2017-12-21 | Discharge: 2017-12-21 | Disposition: A | Discharge status: Home / Self Care | Payer: Medicare Other | Source: Ambulatory Visit | Attending: Vascular Surgery | Admitting: Vascular Surgery

## 2017-12-21 ENCOUNTER — Ambulatory Visit (HOSPITAL_COMMUNITY): Payer: Medicare Other

## 2017-12-21 DIAGNOSIS — T829XXA Unspecified complication of cardiac and vascular prosthetic device, implant and graft, initial encounter: Secondary | ICD-10-CM | POA: Insufficient documentation

## 2017-12-21 DIAGNOSIS — Z9889 Other specified postprocedural states: Secondary | ICD-10-CM | POA: Diagnosis not present

## 2017-12-21 DIAGNOSIS — Z4682 Encounter for fitting and adjustment of non-vascular catheter: Secondary | ICD-10-CM | POA: Diagnosis not present

## 2017-12-21 DIAGNOSIS — Z992 Dependence on renal dialysis: Secondary | ICD-10-CM | POA: Diagnosis not present

## 2017-12-21 DIAGNOSIS — I5032 Chronic diastolic (congestive) heart failure: Secondary | ICD-10-CM | POA: Diagnosis not present

## 2017-12-21 DIAGNOSIS — I132 Hypertensive heart and chronic kidney disease with heart failure and with stage 5 chronic kidney disease, or end stage renal disease: Secondary | ICD-10-CM | POA: Diagnosis not present

## 2017-12-21 DIAGNOSIS — T82898A Other specified complication of vascular prosthetic devices, implants and grafts, initial encounter: Secondary | ICD-10-CM | POA: Diagnosis not present

## 2017-12-21 DIAGNOSIS — Y832 Surgical operation with anastomosis, bypass or graft as the cause of abnormal reaction of the patient, or of later complication, without mention of misadventure at the time of the procedure: Secondary | ICD-10-CM | POA: Diagnosis not present

## 2017-12-21 DIAGNOSIS — Z95828 Presence of other vascular implants and grafts: Secondary | ICD-10-CM

## 2017-12-21 DIAGNOSIS — N186 End stage renal disease: Secondary | ICD-10-CM | POA: Diagnosis not present

## 2017-12-21 HISTORY — PX: REVISON OF ARTERIOVENOUS FISTULA: SHX6074

## 2017-12-21 HISTORY — PX: FISTULOGRAM: SHX5832

## 2017-12-21 HISTORY — PX: DIALYSIS/PERMA CATHETER INSERTION: CATH118288

## 2017-12-21 LAB — POCT I-STAT 4, (NA,K, GLUC, HGB,HCT)
GLUCOSE: 99 mg/dL (ref 70–99)
HCT: 37 % — ABNORMAL LOW (ref 39.0–52.0)
Hemoglobin: 12.6 g/dL — ABNORMAL LOW (ref 13.0–17.0)
Potassium: 4.4 mmol/L (ref 3.5–5.1)
Sodium: 140 mmol/L (ref 135–145)

## 2017-12-21 SURGERY — REVISON OF ARTERIOVENOUS FISTULA
Anesthesia: General | Site: Chest | Laterality: Right

## 2017-12-21 MED ORDER — PHENYLEPHRINE HCL 10 MG/ML IJ SOLN
INTRAMUSCULAR | Status: DC | PRN
  Administered 2017-12-21 (×5): 80 ug via INTRAVENOUS

## 2017-12-21 MED ORDER — IODIXANOL 320 MG/ML IV SOLN
INTRAVENOUS | Status: DC | PRN
  Administered 2017-12-21: 53 mL via INTRAVENOUS

## 2017-12-21 MED ORDER — CEFAZOLIN SODIUM-DEXTROSE 2-4 GM/100ML-% IV SOLN
2.0000 g | INTRAVENOUS | Status: AC
  Administered 2017-12-21: 2 g via INTRAVENOUS
  Filled 2017-12-21: qty 100

## 2017-12-21 MED ORDER — OXYCODONE HCL 5 MG/5ML PO SOLN: 5.0000 mg | Freq: Once | ORAL | Status: AC | PRN

## 2017-12-21 MED ORDER — DEXAMETHASONE SODIUM PHOSPHATE 10 MG/ML IJ SOLN
INTRAMUSCULAR | Status: AC
  Filled 2017-12-21: qty 1

## 2017-12-21 MED ORDER — FENTANYL CITRATE (PF) 100 MCG/2ML IJ SOLN: 25.0000 ug | INTRAMUSCULAR | Status: DC | PRN

## 2017-12-21 MED ORDER — SODIUM CHLORIDE 0.9 % IV SOLN
INTRAVENOUS | Status: DC
  Administered 2017-12-21 (×2): via INTRAVENOUS

## 2017-12-21 MED ORDER — LIDOCAINE 2% (20 MG/ML) 5 ML SYRINGE
INTRAMUSCULAR | Status: DC | PRN
  Administered 2017-12-21: 50 mg via INTRAVENOUS

## 2017-12-21 MED ORDER — EPHEDRINE SULFATE 50 MG/ML IJ SOLN
INTRAMUSCULAR | Status: DC | PRN
  Administered 2017-12-21: 10 mg via INTRAVENOUS

## 2017-12-21 MED ORDER — ONDANSETRON HCL 4 MG/2ML IJ SOLN
INTRAMUSCULAR | Status: AC
  Filled 2017-12-21: qty 2

## 2017-12-21 MED ORDER — SODIUM CHLORIDE 0.9 % IV SOLN
INTRAVENOUS | Status: AC
  Filled 2017-12-21: qty 1.2

## 2017-12-21 MED ORDER — 0.9 % SODIUM CHLORIDE (POUR BTL) OPTIME
TOPICAL | Status: DC | PRN
  Administered 2017-12-21: 1000 mL

## 2017-12-21 MED ORDER — LIDOCAINE HCL (PF) 1 % IJ SOLN
INTRAMUSCULAR | Status: AC
  Filled 2017-12-21: qty 30

## 2017-12-21 MED ORDER — ONDANSETRON HCL 4 MG/2ML IJ SOLN
INTRAMUSCULAR | Status: DC | PRN
  Administered 2017-12-21: 4 mg via INTRAVENOUS

## 2017-12-21 MED ORDER — OXYCODONE-ACETAMINOPHEN 5-325 MG PO TABS: 1.0000 | ORAL_TABLET | Freq: Four times a day (QID) | ORAL | 0 refills | Status: DC | PRN

## 2017-12-21 MED ORDER — MEPERIDINE HCL 50 MG/ML IJ SOLN: 6.2500 mg | INTRAMUSCULAR | Status: DC | PRN

## 2017-12-21 MED ORDER — PHENYLEPHRINE 40 MCG/ML (10ML) SYRINGE FOR IV PUSH (FOR BLOOD PRESSURE SUPPORT)
PREFILLED_SYRINGE | INTRAVENOUS | Status: AC
  Filled 2017-12-21: qty 10

## 2017-12-21 MED ORDER — SODIUM CHLORIDE 0.9 % IV SOLN
INTRAVENOUS | Status: DC | PRN
  Administered 2017-12-21: 07:00:00

## 2017-12-21 MED ORDER — OXYCODONE HCL 5 MG PO TABS
5.0000 mg | ORAL_TABLET | Freq: Once | ORAL | Status: AC | PRN
  Administered 2017-12-21: 5 mg via ORAL

## 2017-12-21 MED ORDER — MIDAZOLAM HCL 5 MG/5ML IJ SOLN
INTRAMUSCULAR | Status: DC | PRN
  Administered 2017-12-21: 2 mg via INTRAVENOUS

## 2017-12-21 MED ORDER — PROPOFOL 500 MG/50ML IV EMUL
INTRAVENOUS | Status: DC | PRN
  Administered 2017-12-21: 50 ug/kg/min via INTRAVENOUS

## 2017-12-21 MED ORDER — HEPARIN SODIUM (PORCINE) 1000 UNIT/ML IJ SOLN
INTRAMUSCULAR | Status: DC | PRN
  Administered 2017-12-21: 3000 [IU] via INTRAVENOUS
  Administered 2017-12-21: 5000 [IU] via INTRAVENOUS

## 2017-12-21 MED ORDER — FENTANYL CITRATE (PF) 250 MCG/5ML IJ SOLN
INTRAMUSCULAR | Status: AC
  Filled 2017-12-21: qty 5

## 2017-12-21 MED ORDER — EPHEDRINE 5 MG/ML INJ
INTRAVENOUS | Status: AC
  Filled 2017-12-21: qty 10

## 2017-12-21 MED ORDER — HEPARIN SODIUM (PORCINE) 1000 UNIT/ML IJ SOLN
INTRAMUSCULAR | Status: DC | PRN
  Administered 2017-12-21: 3.4 [IU] via INTRAVENOUS

## 2017-12-21 MED ORDER — PROPOFOL 10 MG/ML IV BOLUS
INTRAVENOUS | Status: DC | PRN
  Administered 2017-12-21: 20 mg via INTRAVENOUS
  Administered 2017-12-21: 150 mg via INTRAVENOUS
  Administered 2017-12-21: 50 mg via INTRAVENOUS
  Administered 2017-12-21 (×2): 20 mg via INTRAVENOUS

## 2017-12-21 MED ORDER — HEPARIN SODIUM (PORCINE) 1000 UNIT/ML IJ SOLN
INTRAMUSCULAR | Status: AC
  Filled 2017-12-21: qty 1

## 2017-12-21 MED ORDER — PROTAMINE SULFATE 10 MG/ML IV SOLN
INTRAVENOUS | Status: AC
  Filled 2017-12-21: qty 25

## 2017-12-21 MED ORDER — LIDOCAINE HCL (CARDIAC) PF 100 MG/5ML IV SOSY
PREFILLED_SYRINGE | INTRAVENOUS | Status: DC | PRN
  Administered 2017-12-21: 50 mg via INTRAVENOUS

## 2017-12-21 MED ORDER — DEXAMETHASONE SODIUM PHOSPHATE 4 MG/ML IJ SOLN
INTRAMUSCULAR | Status: DC | PRN
  Administered 2017-12-21: 4 mg via INTRAVENOUS

## 2017-12-21 MED ORDER — FENTANYL CITRATE (PF) 100 MCG/2ML IJ SOLN
INTRAMUSCULAR | Status: DC | PRN
  Administered 2017-12-21 (×2): 25 ug via INTRAVENOUS
  Administered 2017-12-21: 50 ug via INTRAVENOUS
  Administered 2017-12-21: 25 ug via INTRAVENOUS
  Administered 2017-12-21: 50 ug via INTRAVENOUS

## 2017-12-21 MED ORDER — LIDOCAINE HCL (CARDIAC) PF 100 MG/5ML IV SOSY: PREFILLED_SYRINGE | INTRAVENOUS | Status: DC | PRN

## 2017-12-21 MED ORDER — MIDAZOLAM HCL 2 MG/2ML IJ SOLN
INTRAMUSCULAR | Status: AC
  Filled 2017-12-21: qty 2

## 2017-12-21 MED ORDER — PROMETHAZINE HCL 25 MG/ML IJ SOLN: 6.2500 mg | INTRAMUSCULAR | Status: DC | PRN

## 2017-12-21 MED ORDER — LIDOCAINE 2% (20 MG/ML) 5 ML SYRINGE
INTRAMUSCULAR | Status: AC
  Filled 2017-12-21: qty 5

## 2017-12-21 MED ORDER — SODIUM CHLORIDE 0.9 % IV SOLN
INTRAVENOUS | Status: DC | PRN
  Administered 2017-12-21: 15 ug/min via INTRAVENOUS

## 2017-12-21 MED ORDER — PROPOFOL 10 MG/ML IV BOLUS
INTRAVENOUS | Status: AC
  Filled 2017-12-21: qty 40

## 2017-12-21 MED ORDER — OXYCODONE HCL 5 MG PO TABS
ORAL_TABLET | ORAL | Status: AC
  Filled 2017-12-21: qty 1

## 2017-12-21 MED ORDER — PHENYLEPHRINE HCL 10 MG/ML IJ SOLN
INTRAMUSCULAR | Status: AC
  Filled 2017-12-21: qty 1

## 2017-12-21 SURGICAL SUPPLY — 68 items
ADH SKN CLS APL DERMABOND .7 (GAUZE/BANDAGES/DRESSINGS) ×4
ARMBAND PINK RESTRICT EXTREMIT (MISCELLANEOUS) ×3 IMPLANT
BALLN DORADO 10X40X80 (BALLOONS) ×3
BALLN MUSTANG 8X60X75 (BALLOONS) ×3
BALLN MUSTANG 9X40X75 (BALLOONS) ×3
BALLOON DORADO 10X40X80 (BALLOONS) ×2 IMPLANT
BALLOON MUSTANG 8X60X75 (BALLOONS) ×2 IMPLANT
BALLOON MUSTANG 9X40X75 (BALLOONS) ×2 IMPLANT
BANDAGE ELASTIC 4 VELCRO ST LF (GAUZE/BANDAGES/DRESSINGS) ×3 IMPLANT
BIOPATCH RED 1 DISK 7.0 (GAUZE/BANDAGES/DRESSINGS) ×3 IMPLANT
BNDG GAUZE ELAST 4 BULKY (GAUZE/BANDAGES/DRESSINGS) ×3 IMPLANT
CANISTER SUCT 3000ML PPV (MISCELLANEOUS) ×3 IMPLANT
CANNULA VESSEL 3MM 2 BLNT TIP (CANNULA) ×3 IMPLANT
CATH BEACON 5 .035 65 KMP TIP (CATHETERS) ×3 IMPLANT
CATH PALINDROME RT-P 15FX23CM (CATHETERS) ×3 IMPLANT
CLIP VESOCCLUDE MED 6/CT (CLIP) ×3 IMPLANT
CLIP VESOCCLUDE SM WIDE 6/CT (CLIP) ×3 IMPLANT
COVER BACK TABLE 60X90IN (DRAPES) ×3 IMPLANT
COVER DOME SNAP 22 D (MISCELLANEOUS) ×3 IMPLANT
COVER PROBE W GEL 5X96 (DRAPES) ×6 IMPLANT
COVER SURGICAL LIGHT HANDLE (MISCELLANEOUS) ×3 IMPLANT
DECANTER SPIKE VIAL GLASS SM (MISCELLANEOUS) ×3 IMPLANT
DERMABOND ADVANCED (GAUZE/BANDAGES/DRESSINGS) ×2
DERMABOND ADVANCED .7 DNX12 (GAUZE/BANDAGES/DRESSINGS) ×4 IMPLANT
DRAIN PENROSE 1/4X12 LTX STRL (WOUND CARE) ×3 IMPLANT
DRAPE CHEST BREAST 15X10 FENES (DRAPES) ×3 IMPLANT
DRSG TEGADERM 4X4.75 (GAUZE/BANDAGES/DRESSINGS) ×3 IMPLANT
ELECT REM PT RETURN 9FT ADLT (ELECTROSURGICAL) ×3
ELECTRODE REM PT RTRN 9FT ADLT (ELECTROSURGICAL) ×2 IMPLANT
GAUZE SPONGE 4X4 12PLY STRL LF (GAUZE/BANDAGES/DRESSINGS) ×6 IMPLANT
GAUZE SPONGE 4X4 16PLY XRAY LF (GAUZE/BANDAGES/DRESSINGS) ×9 IMPLANT
GLOVE BIO SURGEON STRL SZ 6.5 (GLOVE) ×18 IMPLANT
GLOVE BIO SURGEON STRL SZ7.5 (GLOVE) ×6 IMPLANT
GLOVE BIOGEL PI IND STRL 6.5 (GLOVE) ×4 IMPLANT
GLOVE BIOGEL PI INDICATOR 6.5 (GLOVE) ×2
GOWN STRL REUS W/ TWL LRG LVL3 (GOWN DISPOSABLE) ×10 IMPLANT
GOWN STRL REUS W/TWL LRG LVL3 (GOWN DISPOSABLE) ×5
HEMOSTAT SPONGE AVITENE ULTRA (HEMOSTASIS) IMPLANT
KIT BASIN OR (CUSTOM PROCEDURE TRAY) ×3 IMPLANT
KIT ENCORE 26 ADVANTAGE (KITS) ×3 IMPLANT
KIT TURNOVER KIT B (KITS) ×3 IMPLANT
LOOP VESSEL MINI RED (MISCELLANEOUS) IMPLANT
NS IRRIG 1000ML POUR BTL (IV SOLUTION) ×3 IMPLANT
PACK CV ACCESS (CUSTOM PROCEDURE TRAY) ×3 IMPLANT
PAD ARMBOARD 7.5X6 YLW CONV (MISCELLANEOUS) ×6 IMPLANT
SET MICROPUNCTURE 5F STIFF (MISCELLANEOUS) ×3 IMPLANT
SHEATH PINNACLE R/O II 6F 4CM (SHEATH) ×3 IMPLANT
SHEATH PINNACLE R/O II 7F 4CM (SHEATH) ×3 IMPLANT
SPONGE LAP 18X18 X RAY DECT (DISPOSABLE) ×3 IMPLANT
STOPCOCK 4 WAY LG BORE MALE ST (IV SETS) ×3 IMPLANT
SUT ETHILON 3 0 PS 1 (SUTURE) ×3 IMPLANT
SUT MNCRL AB 4-0 PS2 18 (SUTURE) ×3 IMPLANT
SUT PROLENE 3 0 SH1 36 (SUTURE) ×3 IMPLANT
SUT PROLENE 5 0 C 1 24 (SUTURE) ×9 IMPLANT
SUT PROLENE 6 0 CC (SUTURE) ×3 IMPLANT
SUT PROLENE 7 0 BV 1 (SUTURE) IMPLANT
SUT VIC AB 3-0 SH 27 (SUTURE) ×1
SUT VIC AB 3-0 SH 27X BRD (SUTURE) ×2 IMPLANT
SUT VIC AB 4-0 PS2 18 (SUTURE) ×9 IMPLANT
SUT VICRYL 4-0 PS2 18IN ABS (SUTURE) ×3 IMPLANT
SYR 10ML LL (SYRINGE) ×12 IMPLANT
SYR 30ML LL (SYRINGE) ×3 IMPLANT
TAPE CLOTH SURG 4X10 WHT LF (GAUZE/BANDAGES/DRESSINGS) ×3 IMPLANT
TOWEL GREEN STERILE (TOWEL DISPOSABLE) ×3 IMPLANT
TUBING CIL FLEX 10 FLL-RA (TUBING) ×3 IMPLANT
UNDERPAD 30X30 (UNDERPADS AND DIAPERS) ×3 IMPLANT
WATER STERILE IRR 1000ML POUR (IV SOLUTION) ×3 IMPLANT
WIRE BENTSON .035X145CM (WIRE) ×3 IMPLANT

## 2017-12-21 NOTE — Transfer of Care (Cosign Needed)
Immediate Anesthesia Transfer of Care Note  Patient: Ryan [name]  Procedure(s) Performed: PLICATION and Ligation of F LEFT ARM ARTERIOVENOUS FISTULA (Left Arm Upper) FISTULOGRAM with Balloon Angioplasty. (Left Arm Upper) INSERTION OF DIALYSIS CATHETER Right Internal Jugular . (Right Chest)  Patient Location: PACU  Anesthesia Type:General  Level of Consciousness: awake, alert , oriented and sedated  Airway & Oxygen Therapy: Patient Spontanous Breathing and Patient connected to face mask oxygen  Post-op Assessment: Report given to RN, Post -op Vital signs reviewed and stable and Patient moving all extremities  Post vital signs: Reviewed and stable  Last Vitals:  Vitals Value Taken Time  BP    Temp    Pulse 76 12/21/2017 12:15 PM  Resp 16 12/21/2017 12:15 PM  SpO2 100 % 12/21/2017 12:15 PM  Vitals shown include unvalidated device data.  Last Pain:  Vitals:   12/21/17 0644  TempSrc:   PainSc: 0-No pain      Patients Stated Pain Goal: 1 (53/[id number]/82 [id number])  Complications: No apparent anesthesia complications

## 2017-12-21 NOTE — Interval H&P Note (Signed)
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 After consideration of risks, benefits and other options for treatment, the patient has consented to  Procedure(s): PLICATION OF LEFT ARM ARTERIOVENOUS FISTULA (Left) FISTULOGRAM (Left) as a surgical intervention .  The patient's history has been reviewed, patient examined, no change in status, stable for surgery.  I have reviewed the patient's chart and labs.  Questions were answered to the patient's satisfaction.     Ruta Hinds

## 2017-12-21 NOTE — Anesthesia Postprocedure Evaluation (Signed)
Anesthesia Post Note  Patient: Ryan [name]  Procedure(s) Performed: PLICATION and Ligation of F LEFT ARM ARTERIOVENOUS FISTULA (Left Arm Upper) FISTULOGRAM with Balloon Angioplasty. (Left Arm Upper) INSERTION OF DIALYSIS CATHETER Right Internal Jugular . (Right Chest)     Patient location during evaluation: PACU Anesthesia Type: General Level of consciousness: sedated and patient cooperative Pain management: pain level controlled Vital Signs Assessment: post-procedure vital signs reviewed and stable Respiratory status: spontaneous breathing Cardiovascular status: stable Anesthetic complications: no    Last Vitals:  Vitals:   12/21/17 1400 12/21/17 1430  BP: 136/84 (!) 151/87  Pulse: 70 73  Resp: 15 16  Temp:    SpO2: 97% 95%    Last Pain:  Vitals:   12/21/17 1416  TempSrc:   PainSc: [place]

## 2017-12-21 NOTE — Op Note (Signed)
Procedure: Ultrasound left arm, left arm fistulogram, angioplasty of left arm AV fistula x3, attempted plication left arm AV fistula, ligation left arm AV fistula, insertion right internal jugular vein palindrome catheter  Preoperative diagnosis: End-stage renal disease  Postoperative diagnosis: Same  Anesthesia: General  Assistant: Leontine Locket, PA-C  Operative findings: #1 unreconstructable aneurysmal degenerated fistula left arm #2 multiple angioplasties aneurysmal degeneration distal portion of venous outflow number 323 cm palindrome catheter right internal jugular vein  Operative details: After obtaining informed consent, the patient was taken to the operating room.  The patient was placed in supine position operating table.  The patient's entire left upper extremities prepped and draped in usual sterile fashion.  Ultrasound was used to identify the proximal portion of the patient's left upper arm AV fistula.  Micropuncture needle was used to cannulate this without difficulty.  Micropuncture wire was then threaded in the fistula and the micropuncture sheath placed over this.  Contrast angiogram was then obtained.  This showed a diffuse area of multiple narrowings extending from the insertion of the cephalic vein into the subclavian vein and all the way back to the mid left upper arm just distal to the area of aneurysmal degeneration.  The patient was given 3000 units of intravenous heparin.  The micropuncture sheath was swapped out for a 6 French short sheath over a Bentson wire advanced into the central venous system.  I then performed multiple overlapping inflations with a 8 mm angioplasty balloon that was 6 mm in length.  I angioplastied from the insertion into the subclavian vein all the way down to the midportion of the fistula.  This was done nominal pressure for 1 minute each.  Completion angiogram still showed a high-grade area of narrowing at least 50% at the level of the coracoid process  of the scapula.  I then re-angioplastied this with a 9 mm balloon.  There was still a waist on this.  I went all the way up to a 12 mm balloon and this improved somewhat but there is still about a 10% waist.  The luminal diameter however at this point was 6 mm in diameter and there seemed to be a much pedal thrill and less pulsatility to the fistula.  At this point the sheath was removed and hemostasis obtained with a figure-of-eight Monocryl stitch at the insertion site.  Attention was then turned toward the aneurysmal degenerated segment.  An elliptical incision was made incorporated to large aneurysms 7 cm and 4 cm in diameter respectively.  These were very thin-walled.  I dissected these free circumferentially with a normal segment of vein proximal and distal to this.  Due to the large size of the fistula this was fairly tedious and required actually excision of the fascia of the biceps muscle as well.  Patient was then given 5000 units of intravenous heparin.  The fistula was controlled proximally distally with fistula clamps.  Longitudinal opening was made in the AV fistula and it appeared that there was a large sub-intimal cavity and once I removed all this and got into the actual lumen of the fistula this was overall fairly small only about 6 mm in diameter.  I did not feel the plication of this was going to restore the fistula to a diameter that would be usable for AV access.  Additionally because of the severe degeneration of the distal vein I did not feel that placing a PTFE graft would be very durable so the fistula was ligated on its  proximally and then its distal end with a running 5-0 Prolene suture.  Hemostasis was obtained.  I then excised some of the redundant skin over the incision and the subcutaneous layers were then approximated with a running 3-0 Vicryl suture and the skin closed with a 4-0 Vicryl subcuticular stitch.  Dermabond was applied.  Dry sterile dressing Ace wrap was then applied  extending from the hand to the shoulder.  This point the patient's neck and chest were prepped and draped in usual sterile fashion.  Ultrasound was used to identify the right internal jugular vein.  Introducer needle was used to cannulate the right internal jugular vein under ultrasound guidance.  An 035 J-tip guidewire was then threaded through the right internal jugular vein right atrium into the inferior vena cava under fluoroscopic guidance.  Next sequential 1214 and 67 [place] dilators with a peel-away sheath were placed over the guidewire in the right atrium.  A 23 cm palindrome catheter was then placed through the peel-away sheath in the right atrium.  This was tunneled subcutaneously to the right infraclavicular position.  The hub was attached and it was noted to flush and draw easily.  It was sutured the skin with nylon sutures.  The neck insertion site was closed with a Vicryl stitch.  The patient tired the procedure well and there were no complications.  The instrument sponge needle count was correct in the case.  The patient was taken to recovery room in stable condition.  Ruta Hinds, MD [facility] Office: [phone] Pager: [phone]

## 2017-12-21 NOTE — Discharge Instructions (Signed)
° °  [facility]  Discharge Instructions  AV Fistula or Graft Surgery for Dialysis Access  Please refer to the following instructions for your post-procedure care. Your surgeon or physician assistant will discuss any changes with you.  Activity  You may drive the day following your surgery, if you are comfortable and no longer taking prescription pain medication. Resume full activity as the soreness in your incision resolves.  Bathing/Showering  You may shower after you go home. Keep your incision dry for 48 hours. Do not soak in a bathtub, hot tub, or swim until the incision heals completely. You may not shower if you have a hemodialysis catheter.  Incision Care  Do not apply any ointments or creams to your incision. You may have skin glue on your incision. Do not peel it off. It will come off on its own in about one week. Your arm may swell a bit after surgery. To reduce swelling use pillows to elevate your arm so it is above your heart. Your doctor will tell you if you need to lightly wrap your arm with an ACE bandage.   Remove ace wrap bandage in 48 hours.  At that time, may wash incision with soap and water daily.   Diet  Resume your normal diet. There are not special food restrictions following this procedure. In order to heal from your surgery, it is CRITICAL to get adequate nutrition. Your body requires vitamins, minerals, and protein. Vegetables are the best source of vitamins and minerals. Vegetables also provide the perfect balance of protein. Processed food has little nutritional value, so try to avoid this.  Medications  Resume taking all of your medications. If your incision is causing pain, you may take over-the counter pain relievers such as acetaminophen (Tylenol). If you were prescribed a stronger pain medication, please be aware these medications can cause nausea and constipation. Prevent nausea by taking the medication with a snack or  meal. Avoid constipation by drinking plenty of fluids and eating foods with high amount of fiber, such as fruits, vegetables, and grains.  Do not take Tylenol if you are taking prescription pain medications.  Follow up Your surgeon may want to see you in the office following your access surgery. If so, this will be arranged at the time of your surgery.  Please call us immediately for any of the following conditions:  Increased pain, redness, drainage (pus) from your incision site Fever of 101 degrees or higher Severe or worsening pain at your incision site Hand pain or numbness.  Reduce your risk of vascular disease:  Stop smoking. If you would like help, call [facility] at 1-800-QUIT-NOW 214-258-0314) or [place] [facility] [place] your cholesterol Maintain a desired weight Control your diabetes Keep your blood pressure down  Dialysis  It will take several weeks to several months for your new dialysis access to be ready for use. Your surgeon will determine when it is okay to use it. Your nephrologist will continue to direct your dialysis. You can continue to use your Permcath until your new access is ready for use.   12/21/2017 Ryan [name] [id number] 1961-02-01  Surgeon(s): Fields, Jessy Oto, MD    If you have any questions, please call the office at [phone].

## 2017-12-21 NOTE — Anesthesia Procedure Notes (Signed)
Procedure Name: LMA Insertion Date/Time: 12/21/2017 8:03 AM Performed by: Scheryl Darter, CRNA Pre-anesthesia Checklist: Patient identified, Emergency Drugs available, Suction available and Patient being monitored Patient Re-evaluated:Patient Re-evaluated prior to induction Oxygen Delivery Method: Circle System Utilized Preoxygenation: Pre-oxygenation with 100% oxygen Induction Type: IV induction Ventilation: Mask ventilation without difficulty LMA: LMA inserted LMA Size: 5.0 Number of attempts: 1 Airway Equipment and Method: Bite block Placement Confirmation: positive ETCO2 Tube secured with: Tape Dental Injury: Teeth and Oropharynx as per pre-operative assessment

## 2017-12-22 ENCOUNTER — Encounter (HOSPITAL_COMMUNITY): Payer: Self-pay | Admitting: Vascular Surgery

## 2017-12-22 ENCOUNTER — Telehealth: Payer: Self-pay | Admitting: Vascular Surgery

## 2017-12-22 DIAGNOSIS — Z992 Dependence on renal dialysis: Secondary | ICD-10-CM | POA: Diagnosis not present

## 2017-12-22 DIAGNOSIS — N186 End stage renal disease: Secondary | ICD-10-CM | POA: Diagnosis not present

## 2017-12-22 DIAGNOSIS — T8612 Kidney transplant failure: Secondary | ICD-10-CM | POA: Diagnosis not present

## 2017-12-22 DIAGNOSIS — N2581 Secondary hyperparathyroidism of renal origin: Secondary | ICD-10-CM | POA: Diagnosis not present

## 2017-12-22 DIAGNOSIS — E876 Hypokalemia: Secondary | ICD-10-CM | POA: Diagnosis not present

## 2017-12-22 DIAGNOSIS — E8770 Fluid overload, unspecified: Secondary | ICD-10-CM | POA: Diagnosis not present

## 2017-12-22 DIAGNOSIS — D649 Anemia, unspecified: Secondary | ICD-10-CM | POA: Diagnosis not present

## 2017-12-22 NOTE — Telephone Encounter (Signed)
sch appt spk to pt 01-14-18 1pm vein mapping 145pm f/u MD

## 2017-12-22 NOTE — Addendum Note (Signed)
Addendum  created 12/22/17 0817 by Scheryl Darter, CRNA   Intraprocedure Event deleted, Intraprocedure Event edited

## 2017-12-24 ENCOUNTER — Other Ambulatory Visit: Payer: Self-pay

## 2017-12-24 DIAGNOSIS — D649 Anemia, unspecified: Secondary | ICD-10-CM | POA: Diagnosis not present

## 2017-12-24 DIAGNOSIS — N2581 Secondary hyperparathyroidism of renal origin: Secondary | ICD-10-CM | POA: Diagnosis not present

## 2017-12-24 DIAGNOSIS — Z992 Dependence on renal dialysis: Principal | ICD-10-CM

## 2017-12-24 DIAGNOSIS — E8770 Fluid overload, unspecified: Secondary | ICD-10-CM | POA: Diagnosis not present

## 2017-12-24 DIAGNOSIS — N186 End stage renal disease: Secondary | ICD-10-CM | POA: Diagnosis not present

## 2017-12-24 DIAGNOSIS — E876 Hypokalemia: Secondary | ICD-10-CM | POA: Diagnosis not present

## 2017-12-25 ENCOUNTER — Emergency Department (HOSPITAL_COMMUNITY): Payer: Medicare Other

## 2017-12-25 ENCOUNTER — Encounter (HOSPITAL_COMMUNITY): Payer: Self-pay | Admitting: Emergency Medicine

## 2017-12-25 ENCOUNTER — Inpatient Hospital Stay (HOSPITAL_COMMUNITY)
Admission: EM | Admit: 2017-12-25 | Discharge: 2017-12-30 | DRG: 193 | Disposition: A | Discharge status: Home / Self Care | Payer: Medicare Other | Attending: Internal Medicine | Admitting: Internal Medicine

## 2017-12-25 DIAGNOSIS — I132 Hypertensive heart and chronic kidney disease with heart failure and with stage 5 chronic kidney disease, or end stage renal disease: Secondary | ICD-10-CM | POA: Diagnosis not present

## 2017-12-25 DIAGNOSIS — R590 Localized enlarged lymph nodes: Secondary | ICD-10-CM | POA: Diagnosis not present

## 2017-12-25 DIAGNOSIS — Z992 Dependence on renal dialysis: Secondary | ICD-10-CM | POA: Diagnosis not present

## 2017-12-25 DIAGNOSIS — Z881 Allergy status to other antibiotic agents status: Secondary | ICD-10-CM | POA: Diagnosis not present

## 2017-12-25 DIAGNOSIS — E785 Hyperlipidemia, unspecified: Secondary | ICD-10-CM | POA: Diagnosis not present

## 2017-12-25 DIAGNOSIS — E854 Organ-limited amyloidosis: Secondary | ICD-10-CM | POA: Diagnosis not present

## 2017-12-25 DIAGNOSIS — I5033 Acute on chronic diastolic (congestive) heart failure: Secondary | ICD-10-CM | POA: Diagnosis present

## 2017-12-25 DIAGNOSIS — N2581 Secondary hyperparathyroidism of renal origin: Secondary | ICD-10-CM | POA: Diagnosis present

## 2017-12-25 DIAGNOSIS — Y830 Surgical operation with transplant of whole organ as the cause of abnormal reaction of the patient, or of later complication, without mention of misadventure at the time of the procedure: Secondary | ICD-10-CM | POA: Diagnosis present

## 2017-12-25 DIAGNOSIS — T8612 Kidney transplant failure: Secondary | ICD-10-CM | POA: Diagnosis present

## 2017-12-25 DIAGNOSIS — I425 Other restrictive cardiomyopathy: Secondary | ICD-10-CM | POA: Diagnosis present

## 2017-12-25 DIAGNOSIS — D638 Anemia in other chronic diseases classified elsewhere: Secondary | ICD-10-CM | POA: Diagnosis present

## 2017-12-25 DIAGNOSIS — Y95 Nosocomial condition: Secondary | ICD-10-CM | POA: Diagnosis present

## 2017-12-25 DIAGNOSIS — N186 End stage renal disease: Secondary | ICD-10-CM | POA: Diagnosis not present

## 2017-12-25 DIAGNOSIS — E875 Hyperkalemia: Secondary | ICD-10-CM | POA: Diagnosis not present

## 2017-12-25 DIAGNOSIS — I10 Essential (primary) hypertension: Secondary | ICD-10-CM | POA: Diagnosis not present

## 2017-12-25 DIAGNOSIS — J181 Lobar pneumonia, unspecified organism: Secondary | ICD-10-CM | POA: Diagnosis not present

## 2017-12-25 DIAGNOSIS — Z79899 Other long term (current) drug therapy: Secondary | ICD-10-CM

## 2017-12-25 DIAGNOSIS — J189 Pneumonia, unspecified organism: Secondary | ICD-10-CM | POA: Diagnosis not present

## 2017-12-25 DIAGNOSIS — J9601 Acute respiratory failure with hypoxia: Secondary | ICD-10-CM | POA: Diagnosis not present

## 2017-12-25 DIAGNOSIS — I43 Cardiomyopathy in diseases classified elsewhere: Secondary | ICD-10-CM | POA: Diagnosis not present

## 2017-12-25 DIAGNOSIS — E859 Amyloidosis, unspecified: Secondary | ICD-10-CM | POA: Diagnosis not present

## 2017-12-25 DIAGNOSIS — J9811 Atelectasis: Secondary | ICD-10-CM | POA: Diagnosis not present

## 2017-12-25 DIAGNOSIS — D631 Anemia in chronic kidney disease: Secondary | ICD-10-CM | POA: Diagnosis present

## 2017-12-25 DIAGNOSIS — R0602 Shortness of breath: Secondary | ICD-10-CM | POA: Diagnosis not present

## 2017-12-25 DIAGNOSIS — J90 Pleural effusion, not elsewhere classified: Secondary | ICD-10-CM | POA: Diagnosis not present

## 2017-12-25 LAB — BASIC METABOLIC PANEL
Anion gap: 17 — ABNORMAL HIGH (ref 5–15)
BUN: 48 mg/dL — ABNORMAL HIGH (ref 6–20)
CO2: 25 mmol/L (ref 22–32)
Calcium: 8.6 mg/dL — ABNORMAL LOW (ref 8.9–10.3)
Chloride: 95 mmol/L — ABNORMAL LOW (ref 98–111)
Creatinine, Ser: 9.34 mg/dL — ABNORMAL HIGH (ref 0.61–1.24)
GFR calc Af Amer: 6 mL/min — ABNORMAL LOW (ref >60)
GFR calc non Af Amer: 6 mL/min — ABNORMAL LOW (ref >60)
Glucose, Bld: 113 mg/dL — ABNORMAL HIGH (ref 70–99)
Potassium: 4.7 mmol/L (ref 3.5–5.1)
Sodium: 137 mmol/L (ref 135–145)

## 2017-12-25 LAB — CBC WITH DIFFERENTIAL/PLATELET
Basophils Absolute: 0 10*3/uL (ref 0.0–0.1)
Basophils Relative: 0 %
Eosinophils Absolute: 0.3 10*3/uL (ref 0.0–0.7)
Eosinophils Relative: 3 %
HCT: 33.7 % — ABNORMAL LOW (ref 39.0–52.0)
Hemoglobin: 10.4 g/dL — ABNORMAL LOW (ref 13.0–17.0)
Lymphocytes Relative: 8 %
Lymphs Abs: 0.8 10*3/uL (ref 0.7–4.0)
MCH: 23 pg — ABNORMAL LOW (ref 26.0–34.0)
MCHC: 30.9 g/dL (ref 30.0–36.0)
MCV: 74.4 fL — ABNORMAL LOW (ref 78.0–100.0)
Monocytes Absolute: 0.8 10*3/uL (ref 0.1–1.0)
Monocytes Relative: 8 %
Neutro Abs: 8.4 10*3/uL — ABNORMAL HIGH (ref 1.7–7.7)
Neutrophils Relative %: 81 %
Platelets: 273 10*3/uL (ref 150–400)
RBC: 4.53 MIL/uL (ref 4.22–5.81)
RDW: 22.7 % — ABNORMAL HIGH (ref 11.5–15.5)
WBC: 10.3 10*3/uL (ref 4.0–10.5)

## 2017-12-25 MED ORDER — ONDANSETRON HCL 4 MG/2ML IJ SOLN: 4.0000 mg | Freq: Four times a day (QID) | INTRAMUSCULAR | Status: DC | PRN

## 2017-12-25 MED ORDER — FLUTICASONE PROPIONATE 50 MCG/ACT NA SUSP
2.0000 | Freq: Every day | NASAL | Status: DC
  Administered 2017-12-26 – 2017-12-28 (×3): 2 via NASAL
  Filled 2017-12-25 (×2): qty 16

## 2017-12-25 MED ORDER — LORATADINE 10 MG PO TABS: 10.0000 mg | ORAL_TABLET | Freq: Every day | ORAL | Status: DC | PRN

## 2017-12-25 MED ORDER — SODIUM CHLORIDE 0.9 % IJ SOLN
INTRAMUSCULAR | Status: AC
  Filled 2017-12-25: qty 50

## 2017-12-25 MED ORDER — CINACALCET HCL 30 MG PO TABS
30.0000 mg | ORAL_TABLET | Freq: Every day | ORAL | Status: DC
  Administered 2017-12-26 – 2017-12-30 (×5): 30 mg via ORAL
  Filled 2017-12-25 (×4): qty 1

## 2017-12-25 MED ORDER — LABETALOL HCL 200 MG PO TABS
300.0000 mg | ORAL_TABLET | Freq: Two times a day (BID) | ORAL | Status: DC
  Administered 2017-12-26 – 2017-12-30 (×5): 300 mg via ORAL
  Filled 2017-12-25 (×8): qty 1

## 2017-12-25 MED ORDER — VANCOMYCIN VARIABLE DOSE PER UNSTABLE RENAL FUNCTION (PHARMACIST DOSING): Status: DC

## 2017-12-25 MED ORDER — CLONIDINE HCL 0.1 MG PO TABS
0.1000 mg | ORAL_TABLET | Freq: Two times a day (BID) | ORAL | Status: DC
Start: 2017-12-25 — End: 2017-12-30
  Administered 2017-12-26 – 2017-12-30 (×6): 0.1 mg via ORAL
  Filled 2017-12-25 (×8): qty 1

## 2017-12-25 MED ORDER — NIFEDIPINE ER OSMOTIC RELEASE 60 MG PO TB24
60.0000 mg | ORAL_TABLET | Freq: Two times a day (BID) | ORAL | Status: DC
  Administered 2017-12-26 – 2017-12-30 (×6): 60 mg via ORAL
  Filled 2017-12-25 (×9): qty 1

## 2017-12-25 MED ORDER — VANCOMYCIN HCL 10 G IV SOLR
2000.0000 mg | Freq: Once | INTRAVENOUS | Status: AC
  Administered 2017-12-26: 2000 mg via INTRAVENOUS
  Filled 2017-12-25: qty 2000

## 2017-12-25 MED ORDER — SEVELAMER CARBONATE 800 MG PO TABS
800.0000 mg | ORAL_TABLET | Freq: Three times a day (TID) | ORAL | Status: DC
  Administered 2017-12-27 – 2017-12-30 (×10): 800 mg via ORAL
  Filled 2017-12-25 (×10): qty 1

## 2017-12-25 MED ORDER — OXYCODONE-ACETAMINOPHEN 5-325 MG PO TABS
1.0000 | ORAL_TABLET | Freq: Every evening | ORAL | Status: DC | PRN
  Administered 2017-12-26 – 2017-12-28 (×3): 1 via ORAL
  Filled 2017-12-25 (×3): qty 1

## 2017-12-25 MED ORDER — SEVELAMER CARBONATE 800 MG PO TABS
800.0000 mg | ORAL_TABLET | Freq: Three times a day (TID) | ORAL | Status: DC
  Administered 2017-12-26 (×2): 800 mg via ORAL
  Filled 2017-12-25 (×2): qty 1

## 2017-12-25 MED ORDER — GUAIFENESIN ER 600 MG PO TB12: 1200.0000 mg | ORAL_TABLET | Freq: Two times a day (BID) | ORAL | Status: DC | PRN

## 2017-12-25 MED ORDER — ONDANSETRON HCL 4 MG PO TABS: 4.0000 mg | ORAL_TABLET | Freq: Four times a day (QID) | ORAL | Status: DC | PRN

## 2017-12-25 MED ORDER — PREDNISONE 5 MG PO TABS
5.0000 mg | ORAL_TABLET | Freq: Every day | ORAL | Status: DC
  Administered 2017-12-26 – 2017-12-30 (×5): 5 mg via ORAL
  Filled 2017-12-25 (×5): qty 1

## 2017-12-25 MED ORDER — ALBUTEROL SULFATE (2.5 MG/3ML) 0.083% IN NEBU
5.0000 mg | INHALATION_SOLUTION | Freq: Once | RESPIRATORY_TRACT | Status: AC
  Administered 2017-12-25: 5 mg via RESPIRATORY_TRACT
  Filled 2017-12-25: qty 6

## 2017-12-25 MED ORDER — RENA-VITE PO TABS
1.0000 | ORAL_TABLET | Freq: Every day | ORAL | Status: DC
  Administered 2017-12-26 – 2017-12-30 (×5): 1 via ORAL
  Filled 2017-12-25 (×5): qty 1

## 2017-12-25 MED ORDER — IOHEXOL 300 MG/ML  SOLN
75.0000 mL | Freq: Once | INTRAMUSCULAR | Status: AC | PRN
  Administered 2017-12-25: 75 mL via INTRAVENOUS

## 2017-12-25 MED ORDER — IOPAMIDOL (ISOVUE-370) INJECTION 76%
INTRAVENOUS | Status: AC
  Filled 2017-12-25: qty 100

## 2017-12-25 MED ORDER — ACETAMINOPHEN 650 MG RE SUPP: 650.0000 mg | Freq: Four times a day (QID) | RECTAL | Status: DC | PRN

## 2017-12-25 MED ORDER — LOSARTAN POTASSIUM 50 MG PO TABS
50.0000 mg | ORAL_TABLET | Freq: Every day | ORAL | Status: DC
  Administered 2017-12-27 – 2017-12-30 (×2): 50 mg via ORAL
  Filled 2017-12-25 (×4): qty 1

## 2017-12-25 MED ORDER — PRAVASTATIN SODIUM 40 MG PO TABS
80.0000 mg | ORAL_TABLET | Freq: Every day | ORAL | Status: DC
  Administered 2017-12-26 – 2017-12-28 (×4): 80 mg via ORAL
  Filled 2017-12-25: qty 4
  Filled 2017-12-25 (×3): qty 2

## 2017-12-25 MED ORDER — FUROSEMIDE 40 MG PO TABS
80.0000 mg | ORAL_TABLET | Freq: Two times a day (BID) | ORAL | Status: DC
  Filled 2017-12-25: qty 2

## 2017-12-25 MED ORDER — ACETAMINOPHEN 325 MG PO TABS
650.0000 mg | ORAL_TABLET | Freq: Four times a day (QID) | ORAL | Status: DC | PRN
  Administered 2017-12-30: 650 mg via ORAL
  Filled 2017-12-25: qty 2

## 2017-12-25 MED ORDER — SODIUM CHLORIDE 0.9 % IV SOLN
1.0000 g | Freq: Once | INTRAVENOUS | Status: AC
  Administered 2017-12-25: 1 g via INTRAVENOUS
  Filled 2017-12-25: qty 1

## 2017-12-25 MED ORDER — TACROLIMUS 1 MG PO CAPS
5.0000 mg | ORAL_CAPSULE | Freq: Two times a day (BID) | ORAL | Status: DC
  Administered 2017-12-26 – 2017-12-30 (×8): 5 mg via ORAL
  Filled 2017-12-25 (×8): qty 5

## 2017-12-25 MED ORDER — DEXTROMETHORPHAN-GUAIFENESIN 10-100 MG/5ML PO LIQD
10.0000 mL | Freq: Two times a day (BID) | ORAL | Status: DC | PRN
  Filled 2017-12-25: qty 10

## 2017-12-25 NOTE — H&P (Signed)
History and Physical    Ryan [name] OIZ:[id number] DOB: 1961-01-21 DOA: 12/25/2017  PCP: Orma Flaming, MD  Patient coming from: Home.  Chief Complaint: Shortness of breath.  HPI: Ryan [name] is a 57 y.o. 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  Patient felt some difficulty yesterday transiently which improved.  Following which patient started developing some right-sided chest pain and increasing difficulty breathing on deep breaths.  Has been having cough for long time which is chronic.  Denies any fever or chills.  ED Course: In the ER patient had CT chest done which showed right lower lobe collapse/consolidation with small pleural effusion.  Increased number of mediastinal lymph nodes which could be reactive.  Patient was started on empiric antibiotics and admitted for further observation.  Review of Systems: As per HPI, rest all negative.   Past Medical History:  Diagnosis Date  . Allergy   . Blood transfusion without reported diagnosis   . Dialysis patient ([place])    Tues,thurs,sat  . ESRD (end stage renal disease) ([place])   . Hyperlipidemia   . Hypertension     Past Surgical History:  Procedure Laterality Date  . A/V FISTULAGRAM Left 12/15/2016   Procedure: A/V Fistulagram - left;  Surgeon: Angelia Mould, MD;  Location: [facility];  Service: Cardiovascular;  Laterality: Left;  . AV FISTULA PLACEMENT Left 08/28/2016   Procedure: LEFT UPPER  ARM ARTERIOVENOUS (AV) FISTULA CREATION;  Surgeon: Angelia Mould, MD;  Location: [facility];  Service: Vascular;  Laterality: Left;  . DIALYSIS/PERMA  CATHETER INSERTION Right 12/21/2017   Procedure: INSERTION OF DIALYSIS CATHETER Right Internal Jugular .;  Surgeon: Elam Dutch, MD;  Location: [facility];  Service: Vascular;  Laterality: Right;  . FISTULA SUPERFICIALIZATION Left 09/23/2017   Procedure: FISTULA PLICATION LEFT ARM;  Surgeon: Elam Dutch, MD;  Location: [place];  Service: Vascular;  Laterality: Left;  . FISTULOGRAM Left 12/21/2017   Procedure: FISTULOGRAM with Balloon Angioplasty.;  Surgeon: Elam Dutch, MD;  Location: [facility] OR;  Service: Vascular;  Laterality: Left;  . IR THORACENTESIS ASP PLEURAL SPACE W/IMG GUIDE  08/22/2017   1.2 L -right-sided  . IR THORACENTESIS ASP PLEURAL SPACE W/IMG GUIDE  10/19/2017  . REVISON OF ARTERIOVENOUS FISTULA Left 3/38/2505   Procedure: PLICATION and Ligation of F LEFT ARM ARTERIOVENOUS FISTULA;  Surgeon: Elam Dutch, MD;  Location: [facility] OR;  Service: Vascular;  Laterality: Left;  . stent in kidneys     dec 2017  . TRANSTHORACIC ECHOCARDIOGRAM  09/22/2017    Severe LVH.  Normal function -EF 55-60%.  GRII DD.  Moderate RV dilation with mildly reduced RV function.   Fixed right coronary cusp with very mild aortic stenosis.  The myocardium has a speckled appearance --> .   Recommend cardiac MRI to evaluate for amyloid     reports that he has never smoked. He has never used smokeless tobacco. He reports that he does not drink alcohol or use drugs.  Allergies  Allergen Reactions  . Azithromycin Nausea And Vomiting and Other (See Comments)    Chest tightness    Family History  Problem Relation Age of Onset  . Hypertension Mother   .  Heart disease Mother 51       By his report, he thinks that she had heart attack.  . Stroke Mother   . Cancer Father   . Kidney cancer Father   . Hypertension Sister   . Heart disease Maternal Grandmother   . Alcohol abuse Maternal Grandfather   . Mental illness Paternal Grandmother   . Learning disabilities Paternal Grandmother        Alzheimer's    . Stroke Paternal Grandfather   . Colon cancer Neg Hx   . Colon polyps Neg Hx   . Esophageal cancer Neg Hx   . Rectal cancer Neg Hx   . Stomach cancer Neg Hx     Prior to Admission medications   Medication Sig Start Date End Date Taking? Authorizing Provider  azelastine (ASTELIN) 0.1 % nasal spray Place 1 spray into both nostrils 2 (two) times daily. Use in each nostril as directed 10/30/17  Yes Mannam, Praveen, MD  chlorpheniramine (CHLOR-TRIMETON) 4 MG tablet Take 2 tablets (8 mg total) by mouth 3 (three) times daily. Patient taking differently: Take 8 mg by mouth every 6 (six) hours as needed for allergies.  10/30/17  Yes Mannam, Praveen, MD  cinacalcet (SENSIPAR) 30 MG tablet Take 30 mg by mouth daily. 12/08/17  Yes [provider]  cloNIDine (CATAPRES) 0.1 MG tablet Take 1 tablet (0.1 mg total) by mouth 2 (two) times daily. 09/24/17  Yes Sheikh, Omair Latif, DO  fluticasone (FLONASE) 50 MCG/ACT nasal spray Place 2 sprays into both nostrils daily. 10/30/17  Yes Mannam, Praveen, MD  furosemide (LASIX) 80 MG tablet Take 1 tablet (80 mg total) by mouth 2 (two) times daily. 10/05/17  Yes Orma Flaming, MD  labetalol (NORMODYNE) 300 MG tablet Take 1 tablet (300 mg total) by mouth 2 (two) times daily. 10/22/17  Yes Leonie Man, MD  losartan (COZAAR) 50 MG tablet Take 1 tablet (50 mg total) by mouth daily. 10/22/17  Yes Leonie Man, MD  multivitamin (RENA-VIT) TABS tablet Take 1 tablet by mouth daily.   Yes [provider]  NIFEdipine (PROCARDIA XL/ADALAT-CC) 60 MG 24 hr tablet Take 60 mg by mouth 2 (two) times daily.   Yes [provider]  oxyCODONE-acetaminophen (PERCOCET) 5-325 MG tablet Take 1 tablet by mouth every 6 (six) hours as needed for severe pain. 12/21/17  Yes Rhyne, Hulen Shouts, PA-C  pravastatin (PRAVACHOL) 80 MG tablet Take 1 tablet (80 mg total) by mouth at bedtime. 12/02/17  Yes Orma Flaming, MD  predniSONE (DELTASONE) 5 MG tablet Take 5 mg by mouth  daily with breakfast.   Yes [provider]  RENAGEL 800 MG tablet Take 2,400 mg by mouth 3 (three) times daily with meals.  12/14/17  Yes [provider]  sevelamer carbonate (RENVELA) 800 MG tablet Take 1 tablet (800 mg total) by mouth 3 (three) times daily with meals. 09/24/17  Yes Sheikh, Omair Latif, DO  tacrolimus (PROGRAF) 5 MG capsule Take 5 mg by mouth 2 (two) times daily.   Yes [provider]  dextromethorphan-guaiFENesin (ROBITUSSIN-DM) 10-100 MG/5ML liquid Take 10 mLs by mouth every 12 (twelve) hours as needed for cough.    [provider]  guaiFENesin (MUCINEX) 600 MG 12 hr tablet Take 2 tablets (1,200 mg total) by mouth 2 (two) times daily. Patient taking differently: Take 1,200 mg by mouth 2 (two) times daily as needed for cough.  09/24/17   Raiford Noble Latif, DO  lidocaine-prilocaine (EMLA) cream Apply 1 application  topically once.  11/26/17   [provider]  loratadine (CLARITIN) 10 MG tablet Take 10 mg by mouth daily as needed for allergies.    [provider]  omeprazole (PRILOSEC) 20 MG capsule Take 1 capsule (20 mg total) by mouth daily. Patient not taking: Reported on 12/25/2017 10/30/17   Marshell Garfinkel, MD    Physical Exam: Vitals:   12/25/17 1546 12/25/17 1722 12/25/17 1900 12/25/17 2035  BP:  (!) 159/94 (!) 162/98 (!) 164/99  Pulse:  91  98  Resp:  16 (!) 30 (!) 27  Temp:      TempSrc:      SpO2:  94% 92% 92%  Weight: 80.3 kg     Height: 5\' 9"  (1.753 m)         Constitutional: Moderately built and nourished. Vitals:   12/25/17 1546 12/25/17 1722 12/25/17 1900 12/25/17 2035  BP:  (!) 159/94 (!) 162/98 (!) 164/99  Pulse:  91  98  Resp:  16 (!) 30 (!) 27  Temp:      TempSrc:      SpO2:  94% 92% 92%  Weight: 80.3 kg     Height: 5\' 9"  (1.753 m)      Eyes: Anicteric no pallor. ENMT: No discharge from the ears eyes nose or mouth. Neck: No mass felt.  No neck rigidity.  No JVD appreciated. Respiratory: No  rhonchi or crepitations. Cardiovascular: S1-S2 heard no murmurs appreciated. Abdomen: Soft nontender bowel sounds present. Musculoskeletal: No edema.  No joint effusion. Skin: No rash. Neurologic: Alert awake oriented to time place and person.  Moves all extremities. Psychiatric: Appears normal.  Normal affect.   Labs on Admission: I have personally reviewed following labs and imaging studies  CBC: Recent Labs  Lab 12/21/17 0626 12/25/17 1623  WBC  --  10.3  NEUTROABS  --  8.4*  HGB 12.6* 10.4*  HCT 37.0* 33.7*  MCV  --  74.4*  PLT  --  756   Basic Metabolic Panel: Recent Labs  Lab 12/21/17 0626 12/25/17 1623  NA 140 137  K 4.4 4.7  CL  --  95*  CO2  --  25  GLUCOSE 99 113*  BUN  --  48*  CREATININE  --  9.34*  CALCIUM  --  8.6*   GFR: Estimated Creatinine Clearance: 8.8 mL/min (A) (by C-G formula based on SCr of 9.34 mg/dL (H)). Liver Function Tests: No results for input(s): AST, ALT, ALKPHOS, BILITOT, PROT, ALBUMIN in the last 168 hours. No results for input(s): LIPASE, AMYLASE in the last 168 hours. No results for input(s): AMMONIA in the last 168 hours. Coagulation Profile: No results for input(s): INR, PROTIME in the last 168 hours. Cardiac Enzymes: No results for input(s): CKTOTAL, CKMB, CKMBINDEX, TROPONINI in the last 168 hours. BNP (last 3 results) No results for input(s): PROBNP in the last 8760 hours. HbA1C: No results for input(s): HGBA1C in the last 72 hours. CBG: No results for input(s): GLUCAP in the last 168 hours. Lipid Profile: No results for input(s): CHOL, HDL, LDLCALC, TRIG, CHOLHDL, LDLDIRECT in the last 72 hours. Thyroid Function Tests: No results for input(s): TSH, T4TOTAL, FREET4, T3FREE, THYROIDAB in the last 72 hours. Anemia Panel: No results for input(s): VITAMINB12, FOLATE, FERRITIN, TIBC, IRON, RETICCTPCT in the last 72 hours. Urine analysis:    Component Value Date/Time   COLORURINE RED (A) 07/12/2017 0940   APPEARANCEUR  CLOUDY (A) 07/12/2017 0940   LABSPEC  07/12/2017 0940  TEST NOT REPORTED DUE TO COLOR INTERFERENCE OF URINE PIGMENT   PHURINE  07/12/2017 0940    TEST NOT REPORTED DUE TO COLOR INTERFERENCE OF URINE PIGMENT   GLUCOSEU (A) 07/12/2017 0940    TEST NOT REPORTED DUE TO COLOR INTERFERENCE OF URINE PIGMENT   HGBUR (A) 07/12/2017 0940    TEST NOT REPORTED DUE TO COLOR INTERFERENCE OF URINE PIGMENT   BILIRUBINUR (A) 07/12/2017 0940    TEST NOT REPORTED DUE TO COLOR INTERFERENCE OF URINE PIGMENT   KETONESUR (A) 07/12/2017 0940    TEST NOT REPORTED DUE TO COLOR INTERFERENCE OF URINE PIGMENT   PROTEINUR (A) 07/12/2017 0940    TEST NOT REPORTED DUE TO COLOR INTERFERENCE OF URINE PIGMENT   NITRITE (A) 07/12/2017 0940    TEST NOT REPORTED DUE TO COLOR INTERFERENCE OF URINE PIGMENT   LEUKOCYTESUR (A) 07/12/2017 0940    TEST NOT REPORTED DUE TO COLOR INTERFERENCE OF URINE PIGMENT   Sepsis Labs: @LABRCNTIP (procalcitonin:4,lacticidven:4) )No results found for this or any previous visit (from the past 240 hour(s)).   Radiological Exams on Admission: Dg Chest 2 View  Result Date: 12/25/2017 CLINICAL DATA:  Chest pain, dialysis patient, low-grade fever EXAM: CHEST - 2 VIEW COMPARISON:  12/21/2017 FINDINGS: Cardiomegaly with central vascular congestion. New small right pleural effusion with right basilar collapse/consolidation. Difficult to exclude right basilar pneumonia. No pneumothorax. Trachea is midline. Right IJ dialysis catheter tips SVC RA junction. IMPRESSION: New small right pleural effusion with right basilar collapse/consolidation. Pneumonia not excluded. Chronic cardiomegaly and vascular congestion Electronically Signed   By: Jerilynn Mages.  Shick M.D.   On: 12/25/2017 16:45   Ct Chest W Contrast  Result Date: 12/25/2017 CLINICAL DATA:  Cough and shortness of breath. EXAM: CT CHEST WITH CONTRAST TECHNIQUE: Multidetector CT imaging of the chest was performed during intravenous contrast administration.  CONTRAST:  57mL OMNIPAQUE IOHEXOL 300 MG/ML  SOLN COMPARISON:  None. FINDINGS: Cardiovascular: Heart is enlarged. Tiny pericardial effusion evident. Right IJ central line tip is positioned in the upper to mid right atrium. Atherosclerotic calcification is noted in the wall of the thoracic aorta. Mediastinum/Nodes: Numerous tiny lymph nodes are seen in the mediastinum. 13 mm short axis prevascular node (67/4) is mildly enlarged. There is no hilar lymphadenopathy. The esophagus has normal imaging features. Increased number of lymph nodes are seen in the axillary regions and subpectoral spaces bilaterally. Lungs/Pleura: The central tracheobronchial airways are patent. There is bibasilar dependent lower lobe collapse/consolidation, right greater than left. Small right pleural effusion associated. No overtly suspicious nodule or mass. Upper Abdomen: Unremarkable. Musculoskeletal: No worrisome lytic or sclerotic osseous abnormality. Bilateral gynecomastia. IMPRESSION: 1. Right lower lobe collapse/consolidation with small right pleural effusion. 2. Minimal atelectasis posterior left lower lobe. 3. Increased number of nonenlarged lymph nodes in the mediastinum and axillary regions, nonspecific but potentially reactive. There is a single prevascular lymph node which is borderline to mildly enlarged at 13 mm short axis. Follow-up CT chest in 3 months could be used to ensure stability. 4. Cardiomegaly. 5.  Aortic Atherosclerois (ICD10-170.0) Electronically Signed   By: Misty Stanley M.D.   On: 12/25/2017 19:36    EKG: Independently reviewed.  Normal sinus rhythm.  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 Acute respiratory failure with hypoxia with CAT scan showing right lower lobe collapse versus  consolidation with pleural  effusion.  Patient has had previously thoracentesis twice for recurrent pleural effusion.  Patient symptoms may be again due to the same reason.  For now since patient's CAT scan shows possible consolidation with cough we will keep patient on antibiotics.  I have consulted pulmonary critical care for further input.  Previously patient's thoracentesis fluid was showing transudative physiology and was attributed to likely fluid overload.  Will repeat 2 view chest x-ray in the morning. 2. ESRD on hemodialysis on Tuesday Thursday and Saturday.  Due for dialysis on Saturday. 3. Hypertension on clonidine labetalol Cozaar nifedipine. 4. Restrictive cardiomyopathy with amyloidosis on Lasix 80 mg p.o. twice daily.  Patient also gets dialysis. 5. Anemia likely from ESRD.  Follow CBC. 6. Failed renal transplant still on immunosuppressants.   DVT prophylaxis: SCDs in anticipation of possible thoracentesis. Code Status: Full code. Family Communication: Discussed with patient. Disposition Plan: Home. Consults called: Pulmonology. Admission status: Observation.   Rise Patience MD [facility] Pager [phone].  If 7PM-7AM, please contact night-coverage [email or web address] Password [email or web address]  12/25/2017, 10:14 PM

## 2017-12-25 NOTE — ED Notes (Signed)
Called [facility] - megan to [facility]  Called [facility]  Paper work complete

## 2017-12-25 NOTE — ED Triage Notes (Signed)
Patient here from home with complaints of cough and SOB that started today, denies chest pain. Dialysis patient.

## 2017-12-25 NOTE — Progress Notes (Signed)
Pharmacy Antibiotic Note  Ryan [name] is a 57 y.o. male admitted on 12/25/2017 with pneumonia.  Pharmacy has been consulted for cefepime and vancomycin dosing.  Plan: Cefepime 1 gm x1 Vancomycin 2 Gm x1  F/u dialysis schedule for further dosing   Height: 5\' 9"  (175.3 cm) Weight: 177 lb (80.3 kg) IBW/kg (Calculated) : 70.7  Temp (24hrs), Avg:99.8 F (37.7 C), Min:99.8 F (37.7 C), Max:99.8 F (37.7 C)  Recent Labs  Lab 12/25/17 1623  WBC 10.3  CREATININE 9.34*    Estimated Creatinine Clearance: 8.8 mL/min (A) (by C-G formula based on SCr of 9.34 mg/dL (H)).    Allergies  Allergen Reactions  . Azithromycin Nausea And Vomiting and Other (See Comments)    Chest tightness    Antimicrobials this admission: 10/4 cefepime >>  104 vancomycin >>   Dose adjustments this admission:   Microbiology results:  BCx:   UCx:    Sputum:    MRSA PCR:   Thank you for allowing pharmacy to be a part of this patient's care.  Dorrene German 12/25/2017 10:26 PM

## 2017-12-26 ENCOUNTER — Other Ambulatory Visit: Payer: Self-pay

## 2017-12-26 ENCOUNTER — Encounter (HOSPITAL_COMMUNITY): Payer: Self-pay | Admitting: *Deleted

## 2017-12-26 ENCOUNTER — Observation Stay (HOSPITAL_COMMUNITY): Payer: Medicare Other

## 2017-12-26 DIAGNOSIS — I425 Other restrictive cardiomyopathy: Secondary | ICD-10-CM | POA: Diagnosis present

## 2017-12-26 DIAGNOSIS — I120 Hypertensive chronic kidney disease with stage 5 chronic kidney disease or end stage renal disease: Secondary | ICD-10-CM | POA: Diagnosis not present

## 2017-12-26 DIAGNOSIS — E859 Amyloidosis, unspecified: Secondary | ICD-10-CM | POA: Diagnosis present

## 2017-12-26 DIAGNOSIS — J189 Pneumonia, unspecified organism: Secondary | ICD-10-CM | POA: Diagnosis not present

## 2017-12-26 DIAGNOSIS — Z992 Dependence on renal dialysis: Secondary | ICD-10-CM | POA: Diagnosis not present

## 2017-12-26 DIAGNOSIS — N186 End stage renal disease: Secondary | ICD-10-CM | POA: Diagnosis present

## 2017-12-26 DIAGNOSIS — D631 Anemia in chronic kidney disease: Secondary | ICD-10-CM | POA: Diagnosis present

## 2017-12-26 DIAGNOSIS — E8770 Fluid overload, unspecified: Secondary | ICD-10-CM | POA: Diagnosis not present

## 2017-12-26 DIAGNOSIS — T8612 Kidney transplant failure: Secondary | ICD-10-CM | POA: Diagnosis present

## 2017-12-26 DIAGNOSIS — I5033 Acute on chronic diastolic (congestive) heart failure: Secondary | ICD-10-CM | POA: Diagnosis present

## 2017-12-26 DIAGNOSIS — J90 Pleural effusion, not elsewhere classified: Secondary | ICD-10-CM | POA: Diagnosis not present

## 2017-12-26 DIAGNOSIS — Z881 Allergy status to other antibiotic agents status: Secondary | ICD-10-CM | POA: Diagnosis not present

## 2017-12-26 DIAGNOSIS — R091 Pleurisy: Secondary | ICD-10-CM | POA: Diagnosis not present

## 2017-12-26 DIAGNOSIS — I43 Cardiomyopathy in diseases classified elsewhere: Secondary | ICD-10-CM | POA: Diagnosis not present

## 2017-12-26 DIAGNOSIS — Z79899 Other long term (current) drug therapy: Secondary | ICD-10-CM | POA: Diagnosis not present

## 2017-12-26 DIAGNOSIS — E875 Hyperkalemia: Secondary | ICD-10-CM | POA: Diagnosis present

## 2017-12-26 DIAGNOSIS — R590 Localized enlarged lymph nodes: Secondary | ICD-10-CM | POA: Diagnosis present

## 2017-12-26 DIAGNOSIS — D638 Anemia in other chronic diseases classified elsewhere: Secondary | ICD-10-CM | POA: Diagnosis not present

## 2017-12-26 DIAGNOSIS — J181 Lobar pneumonia, unspecified organism: Secondary | ICD-10-CM | POA: Diagnosis present

## 2017-12-26 DIAGNOSIS — I132 Hypertensive heart and chronic kidney disease with heart failure and with stage 5 chronic kidney disease, or end stage renal disease: Secondary | ICD-10-CM | POA: Diagnosis present

## 2017-12-26 DIAGNOSIS — J9811 Atelectasis: Secondary | ICD-10-CM | POA: Diagnosis present

## 2017-12-26 DIAGNOSIS — N2581 Secondary hyperparathyroidism of renal origin: Secondary | ICD-10-CM | POA: Diagnosis present

## 2017-12-26 DIAGNOSIS — R0602 Shortness of breath: Secondary | ICD-10-CM | POA: Diagnosis not present

## 2017-12-26 DIAGNOSIS — J9601 Acute respiratory failure with hypoxia: Secondary | ICD-10-CM | POA: Diagnosis present

## 2017-12-26 DIAGNOSIS — I10 Essential (primary) hypertension: Secondary | ICD-10-CM | POA: Diagnosis not present

## 2017-12-26 DIAGNOSIS — Y95 Nosocomial condition: Secondary | ICD-10-CM | POA: Diagnosis present

## 2017-12-26 DIAGNOSIS — E785 Hyperlipidemia, unspecified: Secondary | ICD-10-CM | POA: Diagnosis present

## 2017-12-26 DIAGNOSIS — E854 Organ-limited amyloidosis: Secondary | ICD-10-CM | POA: Diagnosis not present

## 2017-12-26 DIAGNOSIS — Y830 Surgical operation with transplant of whole organ as the cause of abnormal reaction of the patient, or of later complication, without mention of misadventure at the time of the procedure: Secondary | ICD-10-CM | POA: Diagnosis present

## 2017-12-26 LAB — BASIC METABOLIC PANEL
Anion gap: 18 — ABNORMAL HIGH (ref 5–15)
BUN: 56 mg/dL — AB (ref 6–20)
CHLORIDE: 94 mmol/L — AB (ref 98–111)
CO2: 21 mmol/L — ABNORMAL LOW (ref 22–32)
Calcium: 8.2 mg/dL — ABNORMAL LOW (ref 8.9–10.3)
Creatinine, Ser: 10.52 mg/dL — ABNORMAL HIGH (ref 0.61–1.24)
GFR calc Af Amer: 6 mL/min — ABNORMAL LOW (ref >60)
GFR, EST NON AFRICAN AMERICAN: 5 mL/min — AB (ref >60)
GLUCOSE: 101 mg/dL — AB (ref 70–99)
POTASSIUM: 5.8 mmol/L — AB (ref 3.5–5.1)
Sodium: 133 mmol/L — ABNORMAL LOW (ref 135–145)

## 2017-12-26 LAB — CBC
HEMATOCRIT: 33 % — AB (ref 39.0–52.0)
HEMOGLOBIN: 9.7 g/dL — AB (ref 13.0–17.0)
MCH: 22.1 pg — ABNORMAL LOW (ref 26.0–34.0)
MCHC: 29.4 g/dL — ABNORMAL LOW (ref 30.0–36.0)
MCV: 75.2 fL — ABNORMAL LOW (ref 78.0–100.0)
Platelets: 229 10*3/uL (ref 150–400)
RBC: 4.39 MIL/uL (ref 4.22–5.81)
RDW: 23.1 % — ABNORMAL HIGH (ref 11.5–15.5)
WBC: 11.9 10*3/uL — AB (ref 4.0–10.5)

## 2017-12-26 LAB — MRSA PCR SCREENING: MRSA BY PCR: NEGATIVE

## 2017-12-26 MED ORDER — CHLORHEXIDINE GLUCONATE CLOTH 2 % EX PADS
6.0000 | MEDICATED_PAD | Freq: Every day | CUTANEOUS | Status: DC
  Administered 2017-12-26 – 2017-12-30 (×4): 6 via TOPICAL

## 2017-12-26 MED ORDER — SODIUM CHLORIDE 0.9 % IV SOLN
2.0000 g | INTRAVENOUS | Status: DC
  Administered 2017-12-26: 2 g via INTRAVENOUS
  Filled 2017-12-26 (×2): qty 2

## 2017-12-26 MED ORDER — HEPARIN SODIUM (PORCINE) 1000 UNIT/ML DIALYSIS: 1000.0000 [IU] | INTRAMUSCULAR | Status: DC | PRN

## 2017-12-26 MED ORDER — LIDOCAINE-PRILOCAINE 2.5-2.5 % EX CREA
1.0000 "application " | TOPICAL_CREAM | CUTANEOUS | Status: DC | PRN
  Filled 2017-12-26: qty 5

## 2017-12-26 MED ORDER — SODIUM CHLORIDE 0.9 % IV SOLN: 100.0000 mL | INTRAVENOUS | Status: DC | PRN

## 2017-12-26 MED ORDER — DOXERCALCIFEROL 4 MCG/2ML IV SOLN
1.0000 ug | INTRAVENOUS | Status: DC
  Administered 2017-12-26 – 2017-12-30 (×2): 1 ug via INTRAVENOUS
  Filled 2017-12-26: qty 2

## 2017-12-26 MED ORDER — DOXERCALCIFEROL 4 MCG/2ML IV SOLN
INTRAVENOUS | Status: AC
  Filled 2017-12-26: qty 2

## 2017-12-26 MED ORDER — VANCOMYCIN HCL IN DEXTROSE 750-5 MG/150ML-% IV SOLN
INTRAVENOUS | Status: AC
  Filled 2017-12-26: qty 150

## 2017-12-26 MED ORDER — HYDROMORPHONE HCL 1 MG/ML IJ SOLN
1.0000 mg | INTRAMUSCULAR | Status: DC | PRN
  Administered 2017-12-26 – 2017-12-27 (×4): 2 mg via INTRAVENOUS
  Administered 2017-12-28: 1 mg via INTRAVENOUS
  Filled 2017-12-26: qty 2
  Filled 2017-12-26: qty 1
  Filled 2017-12-26 (×4): qty 2

## 2017-12-26 MED ORDER — VANCOMYCIN HCL IN DEXTROSE 750-5 MG/150ML-% IV SOLN
750.0000 mg | INTRAVENOUS | Status: DC
  Administered 2017-12-26 – 2017-12-30 (×2): 750 mg via INTRAVENOUS
  Filled 2017-12-26: qty 150

## 2017-12-26 MED ORDER — HEPARIN SODIUM (PORCINE) 1000 UNIT/ML IJ SOLN
INTRAMUSCULAR | Status: AC
  Administered 2017-12-26: 1000 [IU]
  Filled 2017-12-26: qty 3

## 2017-12-26 MED ORDER — ALTEPLASE 2 MG IJ SOLR: 2.0000 mg | Freq: Once | INTRAMUSCULAR | Status: DC | PRN

## 2017-12-26 MED ORDER — SODIUM CHLORIDE 0.9 % IV SOLN
2.0000 g | INTRAVENOUS | Status: DC
  Filled 2017-12-26: qty 2

## 2017-12-26 MED ORDER — LIDOCAINE HCL (PF) 1 % IJ SOLN: 5.0000 mL | INTRAMUSCULAR | Status: DC | PRN

## 2017-12-26 MED ORDER — PENTAFLUOROPROP-TETRAFLUOROETH EX AERO: 1.0000 "application " | INHALATION_SPRAY | CUTANEOUS | Status: DC | PRN

## 2017-12-26 NOTE — H&P (Signed)
History and Physical    GEOFF DACANAY DPO:[id number] DOB: Nov 18, 1960 DOA: 12/25/2017   PCP: Orma Flaming, MD   Patient coming from:  Home    Chief Complaint: SHortness of breath   HPI: Ryan [name] is a 57 y.o. 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  He denies any fever or chills.  He is unaware of sick contacts.  The patient had a recent left AV fistula placement, but he is unaware of any infections in the area.  In the ER, CT of the chest showed right lower lobe collapse/versus consolidation and small pleural effusion.  In addition, increased number of mediastinal lymph nodes were noted, suspect to be reactive.  He was started on empiric antibiotics with vancomycin and cefepime, and admitted for further observation.  He is up to 6 kg of fluid overload.  Of note, pulmonary critical care was consulted by admitting physician.  Nephrology was consulted, as he is due for dialysis today.    Review of Systems: Feels short of breath, nonproductive cough.  He denies any fever or chills, or night sweats.  He has mild pleuritic right pain, however he denies any leg swelling, or calf pain.  He denies any abdominal pain, nausea or vomiting.  He does not make urine.  No confusion is reported. Rest of ROS negative    Past Medical History:  Diagnosis Date  . Allergy   . Blood transfusion without reported diagnosis   . Dialysis patient ([place])    Tues,thurs,sat  . ESRD (end stage renal disease) ([place])   . Hyperlipidemia   . Hypertension     Past Surgical History:  Procedure Laterality Date  . A/V FISTULAGRAM Left 12/15/2016   Procedure: A/V Fistulagram - left;  Surgeon: Angelia Mould, MD;  Location: [facility];  Service: Cardiovascular;  Laterality: Left;  . AV FISTULA PLACEMENT Left 08/28/2016   Procedure: LEFT UPPER  ARM ARTERIOVENOUS (AV) FISTULA CREATION;  Surgeon: Angelia Mould, MD;  Location: [facility];  Service: Vascular;  Laterality: Left;  . DIALYSIS/PERMA CATHETER INSERTION Right 12/21/2017   Procedure: INSERTION OF DIALYSIS CATHETER Right Internal Jugular .;  Surgeon: Elam Dutch, MD;  Location: [facility];  Service: Vascular;  Laterality: Right;  . FISTULA SUPERFICIALIZATION Left 09/23/2017   Procedure: FISTULA PLICATION LEFT ARM;  Surgeon: Elam Dutch, MD;  Location: [facility];  Service: Vascular;  Laterality: Left;  . FISTULOGRAM Left 12/21/2017   Procedure: FISTULOGRAM with Balloon Angioplasty.;  Surgeon: Elam Dutch, MD;  Location: [facility];  Service: Vascular;  Laterality: Left;  . IR THORACENTESIS ASP PLEURAL SPACE W/IMG GUIDE  08/22/2017   1.2 L -right-sided  . IR THORACENTESIS ASP PLEURAL SPACE W/IMG GUIDE  10/19/2017  . REVISON OF ARTERIOVENOUS FISTULA Left 4/31/5400   Procedure: PLICATION and Ligation of F LEFT ARM ARTERIOVENOUS FISTULA;  Surgeon: Elam Dutch, MD;  Location: [facility];  Service: Vascular;  Laterality: Left;  . stent in kidneys     dec 2017  . TRANSTHORACIC ECHOCARDIOGRAM  09/22/2017  Severe LVH.  Normal function -EF 55-60%.  GRII DD.  Moderate RV dilation with mildly reduced RV function.   Fixed right coronary cusp with very mild aortic stenosis.  The myocardium has a speckled appearance --> .   Recommend cardiac MRI to evaluate for amyloid    Social History Social History   Socioeconomic History  . Marital status: Divorced    Spouse name: Not on file  . Number  of children: Not on file  . Years of education: Not on file  . Highest education level: Not on file  Occupational History  . Not on file  Social Needs  . Financial resource strain: Not on file  . Food insecurity:    Worry: Not on file    Inability: Not on file  . Transportation needs:    Medical: Not on file    Non-medical: Not on file  Tobacco Use  . Smoking status: Never Smoker  . Smokeless tobacco: Never Used  Substance and Sexual Activity  . Alcohol use: No  . Drug use: No  . Sexual activity: Not Currently    Birth control/protection: None  Lifestyle  . Physical activity:    Days per week: Not on file    Minutes per session: Not on file  . Stress: Not on file  Relationships  . Social connections:    Talks on phone: Not on file    Gets together: Not on file    Attends religious service: Not on file    Active member of club or organization: Not on file    Attends meetings of clubs or organizations: Not on file    Relationship status: Not on file  . Intimate partner violence:    Fear of current or ex partner: Not on file    Emotionally abused: Not on file    Physically abused: Not on file    Forced sexual activity: Not on file  Other Topics Concern  . Not on file  Social History Narrative   Divorced   Does Chiropodist work   3 years of college.   Does not drink, does not smoke.  Does not use illicit drugs.   Son in nursing school at [employer]     Allergies  Allergen Reactions  . Azithromycin Nausea And Vomiting and Other (See Comments)    Chest tightness    Family History  Problem Relation Age of Onset  . Hypertension Mother   . Heart disease Mother 64       By his report, he thinks that she had heart attack.  . Stroke Mother   . Cancer Father   . Kidney cancer Father   . Hypertension Sister   . Heart disease Maternal Grandmother   . Alcohol abuse Maternal Grandfather   . Mental illness Paternal Grandmother   . Learning disabilities Paternal  Grandmother        Alzheimer's   . Stroke Paternal Grandfather   . Colon cancer Neg Hx   . Colon polyps Neg Hx   . Esophageal cancer Neg Hx   . Rectal cancer Neg Hx   . Stomach cancer Neg Hx        Prior to Admission medications   Medication Sig Start Date End Date Taking? Authorizing Provider  azelastine (ASTELIN) 0.1 % nasal spray Place 1 spray into both nostrils 2 (two) times daily. Use in each nostril as directed 10/30/17  Yes Mannam, Praveen, MD  chlorpheniramine (CHLOR-TRIMETON) 4 MG tablet Take 2 tablets (  8 mg total) by mouth 3 (three) times daily. Patient taking differently: Take 8 mg by mouth every 6 (six) hours as needed for allergies.  10/30/17  Yes Mannam, Praveen, MD  cinacalcet (SENSIPAR) 30 MG tablet Take 30 mg by mouth daily. 12/08/17  Yes [provider]  cloNIDine (CATAPRES) 0.1 MG tablet Take 1 tablet (0.1 mg total) by mouth 2 (two) times daily. 09/24/17  Yes Sheikh, Omair Latif, DO  fluticasone (FLONASE) 50 MCG/ACT nasal spray Place 2 sprays into both nostrils daily. 10/30/17  Yes Mannam, Praveen, MD  furosemide (LASIX) 80 MG tablet Take 1 tablet (80 mg total) by mouth 2 (two) times daily. 10/05/17  Yes Orma Flaming, MD  labetalol (NORMODYNE) 300 MG tablet Take 1 tablet (300 mg total) by mouth 2 (two) times daily. 10/22/17  Yes Leonie Man, MD  losartan (COZAAR) 50 MG tablet Take 1 tablet (50 mg total) by mouth daily. 10/22/17  Yes Leonie Man, MD  multivitamin (RENA-VIT) TABS tablet Take 1 tablet by mouth daily.   Yes [provider]  NIFEdipine (PROCARDIA XL/ADALAT-CC) 60 MG 24 hr tablet Take 60 mg by mouth 2 (two) times daily.   Yes [provider]  oxyCODONE-acetaminophen (PERCOCET) 5-325 MG tablet Take 1 tablet by mouth every 6 (six) hours as needed for severe pain. 12/21/17  Yes Rhyne, Hulen Shouts, PA-C  pravastatin (PRAVACHOL) 80 MG tablet Take 1 tablet (80 mg total) by mouth at bedtime. 12/02/17  Yes Orma Flaming, MD  predniSONE  (DELTASONE) 5 MG tablet Take 5 mg by mouth daily with breakfast.   Yes [provider]  RENAGEL 800 MG tablet Take 2,400 mg by mouth 3 (three) times daily with meals.  12/14/17  Yes [provider]  sevelamer carbonate (RENVELA) 800 MG tablet Take 1 tablet (800 mg total) by mouth 3 (three) times daily with meals. 09/24/17  Yes Sheikh, Omair Latif, DO  tacrolimus (PROGRAF) 5 MG capsule Take 5 mg by mouth 2 (two) times daily.   Yes [provider]  dextromethorphan-guaiFENesin (ROBITUSSIN-DM) 10-100 MG/5ML liquid Take 10 mLs by mouth every 12 (twelve) hours as needed for cough.    [provider]  guaiFENesin (MUCINEX) 600 MG 12 hr tablet Take 2 tablets (1,200 mg total) by mouth 2 (two) times daily. Patient taking differently: Take 1,200 mg by mouth 2 (two) times daily as needed for cough.  09/24/17   Raiford Noble Latif, DO  lidocaine-prilocaine (EMLA) cream Apply 1 application topically once.  11/26/17   [provider]  loratadine (CLARITIN) 10 MG tablet Take 10 mg by mouth daily as needed for allergies.    [provider]  omeprazole (PRILOSEC) 20 MG capsule Take 1 capsule (20 mg total) by mouth daily. Patient not taking: Reported on 12/25/2017 10/30/17   Marshell Garfinkel, MD     Physical Exam:  Vitals:   12/26/17 0002 12/26/17 0453 12/26/17 1226 12/26/17 1315  BP: (!) 145/86 123/78 140/90 (!) (P) 151/86  Pulse: 91 78 77 (P) 78  Resp: (!) 36 (!) 28 20 (P) 19  Temp: 100 F (37.8 C) 98.5 F (36.9 C) 99 F (37.2 C) (P) 98.9 F (37.2 C)  TempSrc: Oral Oral Oral (P) Oral  SpO2: 97% 95% 91% (P) 94%  Weight: 78.5 kg 78.5 kg  (P) 80 kg  Height: 5\' 9"  (1.753 m)      General exam: Appears calm but uncomfortable due to shortness of breath and cough Respiratory system: Decreased breath sounds at the  right base, no rubs are heard.  No rhonchi or wheezing.  No accessory muscle use.   Cardiovascular system: S1 & S2 heard, RRR.  Positive JVD, no murmurs,  rubs, gallops or clicks. No pedal edema. Gastrointestinal system: Abdomen is nondistended, soft and nontender. No organomegaly or masses felt. Normal bowel sounds heard. Central nervous system: Alert and oriented. No focal neurological deficits. Extremities: Symmetric 5 x 5 power.  Left AV fistula, right IJ access. Skin: No rashes, lesions or ulcers Psychiatry: Judgement and insight appear normal. Mood & affect appropriate.    Labs on Admission: I have personally reviewed following labs and imaging studies  CBC: Recent Labs  Lab 12/21/17 0626 12/25/17 1623 12/26/17 0646  WBC  --  10.3 11.9*  NEUTROABS  --  8.4*  --   HGB 12.6* 10.4* 9.7*  HCT 37.0* 33.7* 33.0*  MCV  --  74.4* 75.2*  PLT  --  273 191    Basic Metabolic Panel: Recent Labs  Lab 12/21/17 0626 12/25/17 1623 12/26/17 0646  NA 140 137 133*  K 4.4 4.7 5.8*  CL  --  95* 94*  CO2  --  25 21*  GLUCOSE 99 113* 101*  BUN  --  48* 56*  CREATININE  --  9.34* 10.52*  CALCIUM  --  8.6* 8.2*    GFR: Estimated Creatinine Clearance: 7.8 mL/min (A) (by C-G formula based on SCr of 10.52 mg/dL (H)).  Liver Function Tests: No results for input(s): AST, ALT, ALKPHOS, BILITOT, PROT, ALBUMIN in the last 168 hours. No results for input(s): LIPASE, AMYLASE in the last 168 hours. No results for input(s): AMMONIA in the last 168 hours.  Coagulation Profile: No results for input(s): INR, PROTIME in the last 168 hours.  Cardiac Enzymes: No results for input(s): CKTOTAL, CKMB, CKMBINDEX, TROPONINI in the last 168 hours.  BNP (last 3 results) No results for input(s): PROBNP in the last 8760 hours.  HbA1C: No results for input(s): HGBA1C in the last 72 hours.  CBG: No results for input(s): GLUCAP in the last 168 hours.  Lipid Profile: No results for input(s): CHOL, HDL, LDLCALC, TRIG, CHOLHDL, LDLDIRECT in the last 72 hours.  Thyroid Function Tests: No results for input(s): TSH, T4TOTAL, FREET4, T3FREE, THYROIDAB in  the last 72 hours.  Anemia Panel: No results for input(s): VITAMINB12, FOLATE, FERRITIN, TIBC, IRON, RETICCTPCT in the last 72 hours.  Urine analysis:    Component Value Date/Time   COLORURINE RED (A) 07/12/2017 0940   APPEARANCEUR CLOUDY (A) 07/12/2017 0940   LABSPEC  07/12/2017 0940    TEST NOT REPORTED DUE TO COLOR INTERFERENCE OF URINE PIGMENT   PHURINE  07/12/2017 0940    TEST NOT REPORTED DUE TO COLOR INTERFERENCE OF URINE PIGMENT   GLUCOSEU (A) 07/12/2017 0940    TEST NOT REPORTED DUE TO COLOR INTERFERENCE OF URINE PIGMENT   HGBUR (A) 07/12/2017 0940    TEST NOT REPORTED DUE TO COLOR INTERFERENCE OF URINE PIGMENT   BILIRUBINUR (A) 07/12/2017 0940    TEST NOT REPORTED DUE TO COLOR INTERFERENCE OF URINE PIGMENT   KETONESUR (A) 07/12/2017 0940    TEST NOT REPORTED DUE TO COLOR INTERFERENCE OF URINE PIGMENT   PROTEINUR (A) 07/12/2017 0940    TEST NOT REPORTED DUE TO COLOR INTERFERENCE OF URINE PIGMENT   NITRITE (A) 07/12/2017 0940    TEST NOT REPORTED DUE TO COLOR INTERFERENCE OF URINE PIGMENT   LEUKOCYTESUR (A) 07/12/2017 0940    TEST NOT REPORTED DUE TO  COLOR INTERFERENCE OF URINE PIGMENT    Sepsis Labs: @LABRCNTIP (procalcitonin:4,lacticidven:4) ) Recent Results (from the past 240 hour(s))  MRSA PCR Screening     Status: None   Collection Time: 12/25/17 11:50 PM  Result Value Ref Range Status   MRSA by PCR NEGATIVE NEGATIVE Final    Comment:        The GeneXpert MRSA Assay (FDA approved for NASAL specimens only), is one component of a comprehensive MRSA colonization surveillance program. It is not intended to diagnose MRSA infection nor to guide or monitor treatment for MRSA infections. Performed at [facility], [address] [place], [place] [address]      Radiological Exams on Admission: Dg Chest 2 View  Result Date: 12/26/2017 CLINICAL DATA:  Shortness of Breath EXAM: CHEST - 2 VIEW COMPARISON:  12/25/2017 FINDINGS: Right dialysis catheter is  unchanged. Cardiomegaly with vascular congestion. Right lower lobe consolidation with moderate right effusion is similar to prior study. Cannot exclude pneumonia. IMPRESSION: Cardiomegaly, vascular congestion, stable. Continued right lower lobe consolidation with right effusion. Cannot exclude pneumonia. Electronically Signed   By: Rolm Baptise M.D.   On: 12/26/2017 07:48   Dg Chest 2 View  Result Date: 12/25/2017 CLINICAL DATA:  Chest pain, dialysis patient, low-grade fever EXAM: CHEST - 2 VIEW COMPARISON:  12/21/2017 FINDINGS: Cardiomegaly with central vascular congestion. New small right pleural effusion with right basilar collapse/consolidation. Difficult to exclude right basilar pneumonia. No pneumothorax. Trachea is midline. Right IJ dialysis catheter tips SVC RA junction. IMPRESSION: New small right pleural effusion with right basilar collapse/consolidation. Pneumonia not excluded. Chronic cardiomegaly and vascular congestion Electronically Signed   By: Jerilynn Mages.  Shick M.D.   On: 12/25/2017 16:45   Ct Chest W Contrast  Result Date: 12/25/2017 CLINICAL DATA:  Cough and shortness of breath. EXAM: CT CHEST WITH CONTRAST TECHNIQUE: Multidetector CT imaging of the chest was performed during intravenous contrast administration. CONTRAST:  23mL OMNIPAQUE IOHEXOL 300 MG/ML  SOLN COMPARISON:  None. FINDINGS: Cardiovascular: Heart is enlarged. Tiny pericardial effusion evident. Right IJ central line tip is positioned in the upper to mid right atrium. Atherosclerotic calcification is noted in the wall of the thoracic aorta. Mediastinum/Nodes: Numerous tiny lymph nodes are seen in the mediastinum. 13 mm short axis prevascular node (67/4) is mildly enlarged. There is no hilar lymphadenopathy. The esophagus has normal imaging features. Increased number of lymph nodes are seen in the axillary regions and subpectoral spaces bilaterally. Lungs/Pleura: The central tracheobronchial airways are patent. There is bibasilar  dependent lower lobe collapse/consolidation, right greater than left. Small right pleural effusion associated. No overtly suspicious nodule or mass. Upper Abdomen: Unremarkable. Musculoskeletal: No worrisome lytic or sclerotic osseous abnormality. Bilateral gynecomastia. IMPRESSION: 1. Right lower lobe collapse/consolidation with small right pleural effusion. 2. Minimal atelectasis posterior left lower lobe. 3. Increased number of nonenlarged lymph nodes in the mediastinum and axillary regions, nonspecific but potentially reactive. There is a single prevascular lymph node which is borderline to mildly enlarged at 13 mm short axis. Follow-up CT chest in 3 months could be used to ensure stability. 4. Cardiomegaly. 5.  Aortic Atherosclerois (ICD10-170.0) Electronically Signed   By: Misty Stanley M.D.   On: 12/25/2017 19:36    EKG: Independently reviewed.  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 WBC 11.9  Continue cefepime and vancomycin Fluid restriction at 1200 cc oral intake limit Discontinue Lasix ordered, as the patient is not making urine Mucinex as needed. Oxygen as needed  ESRD on HD T, Th S, failed transplant, still on Prograf . Cr. 10.5 Continue Cinacalcet and Prograf Continue Prednisone Dialysis today, appreciate Nephrology involvement   Hypertension Continue clonidine, labetalol, Cozaar, nifedipine after dialysis is performed.  Restrictive cardiomyopathy with amyloidosis Continue dialysis. Follow with cardiology and nephrology (Dr. Lorrene Reid) Daily weights, monitor I/O.  Anemia of chronic disease, in the setting of ESRD on dialysis.  Hemoglobin stable, no  bleeding issues noted. CBC daily   DVT prophylaxis:  SCD Code Status:    Full code Family Communication:  Discussed with patient Disposition Plan: Expect patient to be discharged to home after condition improves Consults called:    Nephrology, Dr. Jonnie Finner                                 Pulmonology at the ED, no further consult indicated   Admission status: IP telemetry   Sharene Butters, PA-C [facility]   Amion text  [phone]   12/26/2017, 2:43 PM

## 2017-12-26 NOTE — Progress Notes (Signed)
Pharmacy Antibiotic Note  Ryan [name] is a 57 y.o. male admitted on 12/25/2017 with pneumonia.  Pharmacy has been consulted for vancomycin and cefepime dosing. Pt has ESRD on HD TTS and is undergoing HD x4h tonight and tolerating well per RN - will give dose with HD.  Plan: -Vancomycin 750mg  IV qHD Tues/Thurs/Sat -Cefepime 2g IV qHD Tues/Thurs/Sat -Monitor cultures, LOT, HD schedule   Height: 5\' 9"  (175.3 cm) Weight: 176 lb 5.9 oz (80 kg) IBW/kg (Calculated) : 70.7  Temp (24hrs), Avg:98.9 F (37.2 C), Min:98.1 F (36.7 C), Max:100 F (37.8 C)  Recent Labs  Lab 12/25/17 1623 12/26/17 0646  WBC 10.3 11.9*  CREATININE 9.34* 10.52*    Estimated Creatinine Clearance: 7.8 mL/min (A) (by C-G formula based on SCr of 10.52 mg/dL (H)).    Allergies  Allergen Reactions  . Azithromycin Nausea And Vomiting and Other (See Comments)    Chest tightness    Antimicrobials this admission: 10/4 cefepime >>  104 vancomycin >>  Dose adjustments this admission: none  Microbiology results: 10/4 MRSA PCR: negative  Thank you for allowing pharmacy to be a part of this patient's care.  Arrie Senate, PharmD, BCPS Clinical Pharmacist [phone] Please check AMION for all [facility] numbers 12/26/2017

## 2017-12-26 NOTE — Consult Note (Addendum)
Renal Service Consult Note [facility]  Ryan [name] 12/26/2017 [place] Requesting Physician:  Dr. Evangeline Gula  Reason for Consult:  ESRD pt w/  HPI: The patient is a 57 y.o. year-old w/ hx of HTN, HL, esrd on HD presented w/ SOB onset last night, no fevers, chills.  In ED had low grade fever, hurts to take deep breath. R sided pleuritic CP.    Recent plication L arm, not fully successful, R IJ TDC placed.  Had renal transplant 2010 which worked well then pt states that last year he lost his SS benefits and couldn't buy his transplant medications so lost his kidney over about 3 mos.  Went back on HD March 2018.        ROS  denies CP  no joint pain   no HA  no blurry vision  no rash  no diarrhea  no nausea/ vomiting  no dysuria  no difficulty voiding  no change in urine color    Past Medical History  Past Medical History:  Diagnosis Date  . Allergy   . Blood transfusion without reported diagnosis   . Dialysis patient ([place])    Tues,thurs,sat  . ESRD (end stage renal disease) ([place])   . Hyperlipidemia   . Hypertension    Past Surgical History  Past Surgical History:  Procedure Laterality Date  . A/V FISTULAGRAM Left 12/15/2016   Procedure: A/V Fistulagram - left;  Surgeon: Angelia Mould, MD;  Location: [place] [facility];  Service: Cardiovascular;  Laterality: Left;  . AV FISTULA PLACEMENT Left 08/28/2016   Procedure: LEFT UPPER  ARM ARTERIOVENOUS (AV) FISTULA CREATION;  Surgeon: Angelia Mould, MD;  Location: [place];  Service: Vascular;  Laterality: Left;  . DIALYSIS/PERMA CATHETER INSERTION Right 12/21/2017   Procedure: INSERTION OF DIALYSIS CATHETER Right Internal Jugular .;  Surgeon: Elam Dutch, MD;  Location: [place];  Service: Vascular;  Laterality: Right;  . FISTULA SUPERFICIALIZATION Left 09/23/2017   Procedure: FISTULA PLICATION LEFT ARM;  Surgeon: Elam Dutch, MD;  Location: [place];  Service: Vascular;   Laterality: Left;  . FISTULOGRAM Left 12/21/2017   Procedure: FISTULOGRAM with Balloon Angioplasty.;  Surgeon: Elam Dutch, MD;  Location: [facility] OR;  Service: Vascular;  Laterality: Left;  . IR THORACENTESIS ASP PLEURAL SPACE W/IMG GUIDE  08/22/2017   1.2 L -right-sided  . IR THORACENTESIS ASP PLEURAL SPACE W/IMG GUIDE  10/19/2017  . REVISON OF ARTERIOVENOUS FISTULA Left 9/32/3557   Procedure: PLICATION and Ligation of F LEFT ARM ARTERIOVENOUS FISTULA;  Surgeon: Elam Dutch, MD;  Location: [facility] OR;  Service: Vascular;  Laterality: Left;  . stent in kidneys     dec 2017  . TRANSTHORACIC ECHOCARDIOGRAM  09/22/2017    Severe LVH.  Normal function -EF 55-60%.  GRII DD.  Moderate RV dilation with mildly reduced RV function.   Fixed right coronary cusp with very mild aortic stenosis.  The myocardium has a speckled appearance --> .   Recommend cardiac MRI to evaluate for amyloid   Family History  Family History  Problem Relation Age of Onset  . Hypertension Mother   . Heart disease Mother 75       By his report, he thinks that she had heart attack.  . Stroke Mother   . Cancer Father   . Kidney cancer Father   . Hypertension Sister   . Heart disease Maternal Grandmother   . Alcohol abuse Maternal Grandfather   . Mental  illness Paternal Grandmother   . Learning disabilities Paternal Grandmother        Alzheimer's   . Stroke Paternal Grandfather   . Colon cancer Neg Hx   . Colon polyps Neg Hx   . Esophageal cancer Neg Hx   . Rectal cancer Neg Hx   . Stomach cancer Neg Hx    Social History  reports that he has never smoked. He has never used smokeless tobacco. He reports that he does not drink alcohol or use drugs. Allergies  Allergies  Allergen Reactions  . Azithromycin Nausea And Vomiting and Other (See Comments)    Chest tightness   Home medications Prior to Admission medications   Medication Sig Start Date End Date Taking? Authorizing Provider  azelastine (ASTELIN) 0.1 %  nasal spray Place 1 spray into both nostrils 2 (two) times daily. Use in each nostril as directed 10/30/17  Yes Mannam, Praveen, MD  chlorpheniramine (CHLOR-TRIMETON) 4 MG tablet Take 2 tablets (8 mg total) by mouth 3 (three) times daily. Patient taking differently: Take 8 mg by mouth every 6 (six) hours as needed for allergies.  10/30/17  Yes Mannam, Praveen, MD  cinacalcet (SENSIPAR) 30 MG tablet Take 30 mg by mouth daily. 12/08/17  Yes [provider]  cloNIDine (CATAPRES) 0.1 MG tablet Take 1 tablet (0.1 mg total) by mouth 2 (two) times daily. 09/24/17  Yes Sheikh, Omair Latif, DO  fluticasone (FLONASE) 50 MCG/ACT nasal spray Place 2 sprays into both nostrils daily. 10/30/17  Yes Mannam, Praveen, MD  furosemide (LASIX) 80 MG tablet Take 1 tablet (80 mg total) by mouth 2 (two) times daily. 10/05/17  Yes Orma Flaming, MD  labetalol (NORMODYNE) 300 MG tablet Take 1 tablet (300 mg total) by mouth 2 (two) times daily. 10/22/17  Yes Leonie Man, MD  losartan (COZAAR) 50 MG tablet Take 1 tablet (50 mg total) by mouth daily. 10/22/17  Yes Leonie Man, MD  multivitamin (RENA-VIT) TABS tablet Take 1 tablet by mouth daily.   Yes [provider]  NIFEdipine (PROCARDIA XL/ADALAT-CC) 60 MG 24 hr tablet Take 60 mg by mouth 2 (two) times daily.   Yes [provider]  oxyCODONE-acetaminophen (PERCOCET) 5-325 MG tablet Take 1 tablet by mouth every 6 (six) hours as needed for severe pain. 12/21/17  Yes Rhyne, Hulen Shouts, PA-C  pravastatin (PRAVACHOL) 80 MG tablet Take 1 tablet (80 mg total) by mouth at bedtime. 12/02/17  Yes Orma Flaming, MD  predniSONE (DELTASONE) 5 MG tablet Take 5 mg by mouth daily with breakfast.   Yes [provider]  RENAGEL 800 MG tablet Take 2,400 mg by mouth 3 (three) times daily with meals.  12/14/17  Yes [provider]  sevelamer carbonate (RENVELA) 800 MG tablet Take 1 tablet (800 mg total) by mouth 3 (three) times daily with meals. 09/24/17  Yes  Sheikh, Omair Latif, DO  tacrolimus (PROGRAF) 5 MG capsule Take 5 mg by mouth 2 (two) times daily.   Yes [provider]  dextromethorphan-guaiFENesin (ROBITUSSIN-DM) 10-100 MG/5ML liquid Take 10 mLs by mouth every 12 (twelve) hours as needed for cough.    [provider]  guaiFENesin (MUCINEX) 600 MG 12 hr tablet Take 2 tablets (1,200 mg total) by mouth 2 (two) times daily. Patient taking differently: Take 1,200 mg by mouth 2 (two) times daily as needed for cough.  09/24/17   Raiford Noble Latif, DO  lidocaine-prilocaine (EMLA) cream Apply 1 application topically once.  11/26/17   [provider]  loratadine (CLARITIN) 10 MG tablet Take 10 mg by mouth daily as needed for allergies.    [provider]  omeprazole (PRILOSEC) 20 MG capsule Take 1 capsule (20 mg total) by mouth daily. Patient not taking: Reported on 12/25/2017 10/30/17   Marshell Garfinkel, MD   Liver Function Tests No results for input(s): AST, ALT, ALKPHOS, BILITOT, PROT, ALBUMIN in the last 168 hours. No results for input(s): LIPASE, AMYLASE in the last 168 hours. CBC Recent Labs  Lab 12/21/17 0626 12/25/17 1623 12/26/17 0646  WBC  --  10.3 11.9*  NEUTROABS  --  8.4*  --   HGB 12.6* 10.4* 9.7*  HCT 37.0* 33.7* 33.0*  MCV  --  74.4* 75.2*  PLT  --  273 859   Basic Metabolic Panel Recent Labs  Lab 12/21/17 0626 12/25/17 1623 12/26/17 0646  NA 140 137 133*  K 4.4 4.7 5.8*  CL  --  95* 94*  CO2  --  25 21*  GLUCOSE 99 113* 101*  BUN  --  48* 56*  CREATININE  --  9.34* 10.52*  CALCIUM  --  8.6* 8.2*   Iron/TIBC/Ferritin/ %Sat No results found for: IRON, TIBC, FERRITIN, IRONPCTSAT  Vitals:   12/25/17 2035 12/25/17 2300 12/26/17 0002 12/26/17 0453  BP: (!) 164/99 (!) 148/91 (!) 145/86 123/78  Pulse: 98  91 78  Resp: (!) 27 (!) 37 (!) 36 (!) 28  Temp:   100 F (37.8 C) 98.5 F (36.9 C)  TempSrc:   Oral Oral  SpO2: 92% 90% 97% 95%  Weight:   78.5 kg 78.5 kg  Height:   5\' 9"   (1.753 m)    Exam Gen alert, no distress No rash, cyanosis or gangrene Sclera anicteric, throat clear  +JVD Chest  E> a changes / bronchial BS R base, crackles L base RRR no MRG Abd soft ntnd no mass or ascites +bs GU normal male MS no joint effusions or deformity Ext no LE or UE edema, no wounds or ulcers Neuro is alert, Ox 3 , nf  R IJ TDC/ L arm AVF +bruit   home meds:  - clonidine 0.1 bid/ nifedipine 60 bid/ furosemide 80 bid/ labetalol 300 bid/ losartan 50 qd  - tacrolimus 5 bid/ prednisone 5 qd  - cinacalcet 30 qd/ sevelamer carbonate 800 tid ac  - pravastatin 80 hs/ omeprazole 20 qd  - prns/ vitamins/ oxy IR prn   Dialysis: [facility]  4h  72.5kg  R IJ TDC  2/2 bath  Hep none  - hect 1 ug tiw  - mircera 225 q 2, last 9/26     Impression: 1. CP/ SOB - prob RLL PNA, also some degree of fluid overload most likely 2. Vol - up 6kg 3. ESRD HD TTS.  HD today 4. HTN - on multiple BP meds at home 5. Anemia ckd - Hb 9-10   Plan - HD today upstairs, max UF  Kelly Splinter MD [facility] pager [phone]   12/26/2017, 9:07 AM

## 2017-12-26 NOTE — Progress Notes (Signed)
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  He denies any fever or chills.  He is unaware of sick contacts.  The patient had a recent left AV fistula placement, but he is unaware of any infections in the area.  In the ER, CT of the chest showed right lower lobe collapse/versus consolidation and small pleural effusion.  In addition, increased number of mediastinal lymph nodes were noted, suspect to be reactive.  He was started on empiric antibiotics with vancomycin and cefepime, and admitted for further observation.  He is up to 6 kg of fluid overload.  Of note, pulmonary critical care was consulted by admitting physician.  Nephrology was consulted, as he is due for dialysis today.  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 WBC 11.9  Continue cefepime and vancomycin Fluid restriction at 1200 cc oral intake limit Discontinue Lasix ordered, as the patient is not making urine Mucinex as  needed. Oxygen as needed  ESRD on HD T, Th S, failed transplant, still on Prograf . Cr. 10.5 Continue Cinacalcet and Prograf Continue Prednisone Dialysis today, appreciate Nephrology involvement   Hypertension Continue clonidine, labetalol, Cozaar, nifedipine after dialysis is performed.  Restrictive cardiomyopathy with amyloidosis Continue dialysis. Follow with cardiology and nephrology (Dr. Lorrene Reid) Daily weights, monitor I/O.  Anemia of chronic disease, in the setting of ESRD on dialysis.  Hemoglobin stable, no bleeding issues noted. CBC daily      DVT prophylaxis: SCDs Code Status: Full code  Family Communication: Discussed with patient Disposition Plan: Home  Consultants:   Pulmonology at the ER  Nephrology, Dr. Jonnie Finner  Procedures:  HD today 12/26/2017  Antimicrobials:   Vancomycin and Cefepime   Subjective: Feels short of breath, nonproductive cough.  He denies any fever or chills, or night sweats.  He has mild pleuritic right pain, however he denies any leg swelling, or calf pain.  He denies any abdominal pain, nausea or vomiting.  He does not make urine.  No confusion is reported.  Objective: Vitals:   12/25/17 2300 12/26/17 0002 12/26/17 0453 12/26/17 1226  BP: (!) 148/91 (!) 145/86 123/78 140/90  Pulse:  91 78 77  Resp: (!) 37 (!) 36 (!) 28 20  Temp:  100 F (37.8 C) 98.5 F (36.9 C) 99 F (37.2 C)  TempSrc:  Oral Oral Oral  SpO2: 90% 97% 95% 91%  Weight:  78.5 kg 78.5 kg   Height:  5\' 9"  (1.753 m)      Intake/Output Summary (Last 24 hours) at 12/26/2017 1251 Last data filed at 12/26/2017 0958 Gross per 24 hour  Intake 840 ml  Output -  Net 840 ml   Filed Weights   12/25/17 1546 12/26/17 0002 12/26/17 0453  Weight: 80.3 kg 78.5 kg 78.5 kg    Examination:  General exam: Appears calm but uncomfortable due to shortness of breath and cough Respiratory system: Decreased breath sounds at the right base, no rubs are heard.  No rhonchi or  wheezing.  No accessory muscle use.   Cardiovascular system: S1 & S2 heard, RRR.  Positive JVD, no murmurs, rubs, gallops or clicks. No pedal edema. Gastrointestinal system: Abdomen is nondistended, soft and nontender. No organomegaly or masses felt. Normal bowel sounds heard. Central nervous system: Alert and oriented. No focal neurological deficits. Extremities: Symmetric 5 x 5 power.  Left AV fistula, right IJ access. Skin: No rashes, lesions or ulcers Psychiatry: Judgement and insight appear normal. Mood & affect appropriate.     Data Reviewed: I have personally reviewed following labs and imaging studies  CBC: Recent Labs  Lab 12/21/17 0626 12/25/17 1623 12/26/17 0646  WBC  --  10.3 11.9*  NEUTROABS  --  8.4*  --   HGB 12.6* 10.4* 9.7*  HCT 37.0* 33.7* 33.0*  MCV  --  74.4* 75.2*  PLT  --  273 784   Basic Metabolic Panel: Recent Labs  Lab 12/21/17 0626 12/25/17 1623 12/26/17 0646  NA 140 137 133*  K 4.4 4.7 5.8*  CL  --  95* 94*  CO2  --  25 21*  GLUCOSE 99 113* 101*  BUN  --  48* 56*  CREATININE  --  9.34* 10.52*  CALCIUM  --  8.6* 8.2*   GFR: Estimated Creatinine Clearance: 7.8 mL/min (A) (by C-G formula based on SCr of 10.52 mg/dL (H)). Liver Function Tests: No results for input(s): AST, ALT, ALKPHOS, BILITOT, PROT, ALBUMIN in the last 168 hours. No results for input(s): LIPASE, AMYLASE in the last 168 hours. No results for input(s): AMMONIA in the last 168 hours. Coagulation Profile: No results for input(s): INR, PROTIME in the last 168 hours. Cardiac Enzymes: No results for input(s): CKTOTAL, CKMB, CKMBINDEX, TROPONINI in the last 168 hours. BNP (last 3 results) No results for input(s): PROBNP in the last 8760 hours. HbA1C: No results for input(s): HGBA1C in the last 72 hours. CBG: No results for input(s): GLUCAP in the last 168 hours. Lipid Profile: No results for input(s): CHOL, HDL, LDLCALC, TRIG, CHOLHDL, LDLDIRECT in the last 72 hours. Thyroid  Function Tests: No results for input(s): TSH, T4TOTAL, FREET4, T3FREE, THYROIDAB in the last 72 hours. Anemia Panel: No results for input(s): VITAMINB12, FOLATE, FERRITIN, TIBC, IRON, RETICCTPCT in the last 72 hours. Sepsis Labs: No results for input(s): PROCALCITON, LATICACIDVEN in the last 168 hours.  Recent Results (from the past 240 hour(s))  MRSA PCR Screening     Status: None   Collection Time: 12/25/17 11:50 PM  Result Value Ref Range Status   MRSA by PCR NEGATIVE NEGATIVE Final    Comment:        The GeneXpert MRSA Assay (FDA approved for NASAL specimens only), is one component of a comprehensive MRSA colonization surveillance program. It is not intended to diagnose MRSA infection nor to guide or monitor treatment for MRSA infections. Performed at [facility], [place] [address], [place],  [address]          Radiology Studies: Dg Chest 2 View  Result Date: 12/26/2017 CLINICAL DATA:  Shortness of Breath EXAM: CHEST - 2 VIEW COMPARISON:  12/25/2017 FINDINGS: Right dialysis  catheter is unchanged. Cardiomegaly with vascular congestion. Right lower lobe consolidation with moderate right effusion is similar to prior study. Cannot exclude pneumonia. IMPRESSION: Cardiomegaly, vascular congestion, stable. Continued right lower lobe consolidation with right effusion. Cannot exclude pneumonia. Electronically Signed   By: Rolm Baptise M.D.   On: 12/26/2017 07:48   Dg Chest 2 View  Result Date: 12/25/2017 CLINICAL DATA:  Chest pain, dialysis patient, low-grade fever EXAM: CHEST - 2 VIEW COMPARISON:  12/21/2017 FINDINGS: Cardiomegaly with central vascular congestion. New small right pleural effusion with right basilar collapse/consolidation. Difficult to exclude right basilar pneumonia. No pneumothorax. Trachea is midline. Right IJ dialysis catheter tips SVC RA junction. IMPRESSION: New small right pleural effusion with right basilar collapse/consolidation. Pneumonia not  excluded. Chronic cardiomegaly and vascular congestion Electronically Signed   By: Jerilynn Mages.  Shick M.D.   On: 12/25/2017 16:45   Ct Chest W Contrast  Result Date: 12/25/2017 CLINICAL DATA:  Cough and shortness of breath. EXAM: CT CHEST WITH CONTRAST TECHNIQUE: Multidetector CT imaging of the chest was performed during intravenous contrast administration. CONTRAST:  3mL OMNIPAQUE IOHEXOL 300 MG/ML  SOLN COMPARISON:  None. FINDINGS: Cardiovascular: Heart is enlarged. Tiny pericardial effusion evident. Right IJ central line tip is positioned in the upper to mid right atrium. Atherosclerotic calcification is noted in the wall of the thoracic aorta. Mediastinum/Nodes: Numerous tiny lymph nodes are seen in the mediastinum. 13 mm short axis prevascular node (67/4) is mildly enlarged. There is no hilar lymphadenopathy. The esophagus has normal imaging features. Increased number of lymph nodes are seen in the axillary regions and subpectoral spaces bilaterally. Lungs/Pleura: The central tracheobronchial airways are patent. There is bibasilar dependent lower lobe collapse/consolidation, right greater than left. Small right pleural effusion associated. No overtly suspicious nodule or mass. Upper Abdomen: Unremarkable. Musculoskeletal: No worrisome lytic or sclerotic osseous abnormality. Bilateral gynecomastia. IMPRESSION: 1. Right lower lobe collapse/consolidation with small right pleural effusion. 2. Minimal atelectasis posterior left lower lobe. 3. Increased number of nonenlarged lymph nodes in the mediastinum and axillary regions, nonspecific but potentially reactive. There is a single prevascular lymph node which is borderline to mildly enlarged at 13 mm short axis. Follow-up CT chest in 3 months could be used to ensure stability. 4. Cardiomegaly. 5.  Aortic Atherosclerois (ICD10-170.0) Electronically Signed   By: Misty Stanley M.D.   On: 12/25/2017 19:36        Scheduled Meds: . Chlorhexidine Gluconate Cloth  6  each Topical Q0600  . cinacalcet  30 mg Oral Q breakfast  . cloNIDine  0.1 mg Oral BID  . fluticasone  2 spray Each Nare Daily  . furosemide  80 mg Oral BID  . labetalol  300 mg Oral BID  . losartan  50 mg Oral Daily  . multivitamin  1 tablet Oral Daily  . NIFEdipine  60 mg Oral BID  . pravastatin  80 mg Oral QHS  . predniSONE  5 mg Oral Q breakfast  . sevelamer carbonate  800 mg Oral TID WC  . sevelamer carbonate  800 mg Oral TID WC  . tacrolimus  5 mg Oral BID  . vancomycin variable dose per unstable renal function (pharmacist dosing)   Does not apply See admin instructions   Continuous Infusions:   LOS: 0 days        Sharene Butters, MD [facility] Pager [phone] or text via [place] or [place]  If 7PM-7AM, please contact night-coverage [email or web address] Password [email or web address] 12/26/2017, 12:51 PM

## 2017-12-26 NOTE — Care Management Note (Signed)
Case Management Note  Patient Details  Name: BRANDONN CAPELLI MRN: [id number] Date of Birth: 1960/07/27  Subjective/Objective:                 obs for SOB   Action/Plan:  PAtient from home alone.  HD at [facility] TTS. Drives himself. Denies barriers to accessing PCP and medications. Does not weigh daily. When asked if he had heart failure he said no, although records state diastolic HF. Will need education. Currently on supplemental O2, non dependent, will need to follow for home O2 and possible HH for patient with multiple co morbidities and 3 admissions in 6 months.  Patient would like to use [facility]. Rodney Langton notified.  Expected Discharge Date:                  Expected Discharge Plan:     In-House Referral:     Discharge planning Services  CM Consult  Post Acute Care Choice:    Choice offered to:     DME Arranged:    DME Agency:     HH Arranged:    HH Agency:     Status of Service:  In process, will continue to follow  If discussed at Long Length of Stay Meetings, dates discussed:    Additional Comments:  Carles Collet, RN 12/26/2017, 9:37 AM

## 2017-12-26 NOTE — Care Management Obs Status (Signed)
[place] NOTIFICATION   Patient Details  Name: FREDDRICK GLADSON MRN: [id number] Date of Birth: 1960/07/07   Medicare Observation Status Notification Given:  Yes    Carles Collet, RN 12/26/2017, 9:35 AM

## 2017-12-27 DIAGNOSIS — R091 Pleurisy: Secondary | ICD-10-CM

## 2017-12-27 DIAGNOSIS — Z992 Dependence on renal dialysis: Secondary | ICD-10-CM

## 2017-12-27 DIAGNOSIS — I43 Cardiomyopathy in diseases classified elsewhere: Secondary | ICD-10-CM

## 2017-12-27 DIAGNOSIS — E854 Organ-limited amyloidosis: Secondary | ICD-10-CM

## 2017-12-27 DIAGNOSIS — J9601 Acute respiratory failure with hypoxia: Secondary | ICD-10-CM

## 2017-12-27 DIAGNOSIS — J181 Lobar pneumonia, unspecified organism: Principal | ICD-10-CM

## 2017-12-27 DIAGNOSIS — N186 End stage renal disease: Secondary | ICD-10-CM

## 2017-12-27 DIAGNOSIS — I10 Essential (primary) hypertension: Secondary | ICD-10-CM

## 2017-12-27 LAB — HEPATITIS B SURFACE ANTIGEN: Hepatitis B Surface Ag: NEGATIVE

## 2017-12-27 LAB — HIV ANTIBODY (ROUTINE TESTING W REFLEX): HIV Screen 4th Generation wRfx: NONREACTIVE

## 2017-12-27 NOTE — Progress Notes (Addendum)
[facility] Progress Note   Subjective:  Had HD yesterday with 5L removed  C/o DOE, R sided pleuritic pain -- some improvement after HD  Objective Vitals:   12/26/17 2000 12/26/17 2154 12/27/17 0041 12/27/17 0606  BP: (!) 171/95 (!) 143/93 132/83 (!) 143/83  Pulse:   78 84  Resp:   18 18  Temp: (!) 100.7 F (38.2 C) 99.6 F (37.6 C) 98.9 F (37.2 C) 99 F (37.2 C)  TempSrc: Oral Oral Oral Oral  SpO2: 96%  96% 95%  Weight:    74.4 kg  Height:       Physical Exam General: WNWD male sitting up in bed NAD on nasal O2 Heart: RRR no murmur Lungs:  Faint crackles L base; crackles bronchial breath sounds R base  Abdomen: +BS soft NT/ND Extremities: No LE edema  Dialysis Access: R IJ TDC; recent unsuccessful plication of LA AVF no bruit   home meds:  - clonidine 0.1 bid/ nifedipine 60 bid/ labetalol 300 bid/ losartan 50 qd  - tacrolimus 5 bid/ prednisone 5 qd  - cinacalcet 30 qd/ sevelamer carbonate 800 tid ac  - pravastatin 80 hs/ omeprazole 20 qd  - prns/ vitamins/ oxy IR prn  Dialysis: [facility]  4h  72.5kg  R IJ TDC  2/2 bath  Hep none  - hect 1 ug tiw  - mircera 225 q 2, last 9/26   Assessment/Plan: 1. CP/SOB 2/2 RLL PNA, also some degree of fluid overload. On IV Vanc/cefepime. Had HD yesterday with 5L off. Some improvement after HD, only 2kg over EDW now, but patient remains dyspneic and requests extra treatment tomorrow.  2. ESRD - HD TTS. Will write orders for short extra treatment tomorrow if schedule allows.  3. HTN/volume  - BP better after HD. 4 BP meds at home.  Post HD weight 74.7kg. Push to EDW with HD tomorrow.  4. Anemia- Hgb 9.7. ESA recently dosed as outpatient  5. MBD- Ca ok. Continue VDRA/binders/sensipar  6. Hx failed renal Tx  - On Prograf  Ogechi Larina Earthly PA-C [place] Pager [phone] 12/27/2017,10:46 AM  LOS: 1 day   Pt seen, examined and agree w A/P as above.  Kelly Splinter MD [facility] pager [phone]   12/27/2017, 1:28 PM    Additional Objective Labs: Basic Metabolic Panel: Recent Labs  Lab 12/21/17 0626 12/25/17 1623 12/26/17 0646  NA 140 137 133*  K 4.4 4.7 5.8*  CL  --  95* 94*  CO2  --  25 21*  GLUCOSE 99 113* 101*  BUN  --  48* 56*  CREATININE  --  9.34* 10.52*  CALCIUM  --  8.6* 8.2*   CBC: Recent Labs  Lab 12/21/17 0626 12/25/17 1623 12/26/17 0646  WBC  --  10.3 11.9*  NEUTROABS  --  8.4*  --   HGB 12.6* 10.4* 9.7*  HCT 37.0* 33.7* 33.0*  MCV  --  74.4* 75.2*  PLT  --  273 229   Blood Culture    Component Value Date/Time   SDES FLUID RIGHT PLEURAL 10/19/2017 1059   SPECREQUEST NO BACTERIA CULTURE ORDERED,PER DR Eliseo Squires 10/19/2017 1059   CULT NO GROWTH 10/07/2016 0907   REPTSTATUS 10/19/2017 FINAL 10/19/2017 1059    Cardiac Enzymes: No results for input(s): CKTOTAL, CKMB, CKMBINDEX, TROPONINI in the last 168 hours. CBG: No results for input(s): GLUCAP in the last 168 hours. Iron Studies: No results for input(s): IRON, TIBC, TRANSFERRIN, FERRITIN in the  last 72 hours. Lab Results  Component Value Date   INR 1.13 09/23/2017   INR 1.01 08/28/2016   Medications: . ceFEPime (MAXIPIME) IV 2 g (12/26/17 2001)  . vancomycin 750 mg (12/26/17 1612)   . Chlorhexidine Gluconate Cloth  6 each Topical Q0600  . cinacalcet  30 mg Oral Q breakfast  . cloNIDine  0.1 mg Oral BID  . [START ON 12/29/2017] doxercalciferol  1 mcg Intravenous Q T,Th,Sa-HD  . fluticasone  2 spray Each Nare Daily  . labetalol  300 mg Oral BID  . losartan  50 mg Oral Daily  . multivitamin  1 tablet Oral Daily  . NIFEdipine  60 mg Oral BID  . pravastatin  80 mg Oral QHS  . predniSONE  5 mg Oral Q breakfast  . sevelamer carbonate  800 mg Oral TID WC  . sevelamer carbonate  800 mg Oral TID WC  . tacrolimus  5 mg Oral BID  . vancomycin variable dose per unstable renal function (pharmacist dosing)   Does not apply See admin instructions

## 2017-12-27 NOTE — Progress Notes (Signed)
PROGRESS NOTE   Ryan [name]  WUJ:[id number]    DOB: [birth year]    DOA: 12/25/2017  PCP: Orma Flaming, MD   I have briefly reviewed patients previous medical records in [facility].  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  CT chest showed right lower lobe collapse/consolidation and small pleural effusion, mediastinal lymphadenopathy felt to be reactive and admitted for suspected HCAP.   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  Empirically started on IV vancomycin/cefepime, continue.  Supportive treatment.  ESRD on TTS HD: With suspected volume overload.  Nephrology consulting.  Underwent HD 10/5 and 5 L fluid removed with some improvement in dyspnea.  Plan for off schedule HD again 10/7 because he is 2 KG's over EDW now.  On 9/30: Underwent left arm fistulogram, angioplasty of left arm AV fistula x3 and attempted plication of left arm AV fistula, ligation of left arm AV fistula and insertion of right IJ catheter  Hyperkalemia: Potassium 5.8 on 10/5.  Management per nephrology.  Suspect he got dialysis after this blood result.  Follow BMP at HD on 10/7  Acute  respiratory failure with hypoxia: Secondary to pneumonia and pulmonary edema.  Volume management across dialysis.  Essential hypertension: Mildly uncontrolled.  Volume management across HD.  Continue labetalol, losartan, clonidine and nifedipine.  Given polypharmacy, suspect difficult to control.  Anemia in ESRD: Stable.  Failed renal transplant: Continue prednisone, tacrolimus.  Restrictive cardiomyopathy with amyloidosis: Follows with outpatient cardiology and nephrology.  Lasix held.  Mediastinal/prevascular lymphadenopathy: Outpatient follow-up.     DVT prophylaxis: SCDs Code Status: Full Family Communication: None at bedside Disposition: DC home pending clinical improvement.   Consultants:  Nephrology  Procedures:  HD  Antimicrobials:  IV cefepime and vancomycin   Subjective: Feels better.  Dyspnea improved but breathing not yet at baseline.  Ongoing right lower pleuritic chest pain.  Minimal intermittent dry cough.  Low-grade fevers.  ROS: As above.  Objective:  Vitals:   12/26/17 2154 12/27/17 0041 12/27/17 0606 12/27/17 1526  BP: (!) 143/93 132/83 (!) 143/83 116/66  Pulse:  78 84 81  Resp:  18 18 18   Temp: 99.6 F (37.6 C) 98.9 F (37.2 C) 99 F (37.2 C) 98.5 F (36.9 C)  TempSrc: Oral Oral Oral Oral  SpO2:  96% 95% (!) 88%  Weight:   74.4 kg   Height:        Examination:  General exam: Pleasant young male, moderately built and nourished, lying comfortably propped up in bed.  Does not appear septic or toxic. Respiratory system: Reduced breath sounds in the bases, right > left.  Right basal bronchial breath sounds.  Occasional basal crackles.  Possible right basal pleural rub.  Unable to take deep breaths due to pain.  Right IJ HD catheter. Cardiovascular system: S1 &  S2 heard, RRR. No JVD, murmurs, rubs, gallops or clicks. No pedal edema.  Telemetry personally reviewed: Sinus rhythm. Gastrointestinal system: Abdomen is nondistended, soft and  nontender. No organomegaly or masses felt. Normal bowel sounds heard. Central nervous system: Alert and oriented. No focal neurological deficits. Extremities: Symmetric 5 x 5 power.  Left upper arm AV fistula. Skin: No rashes, lesions or ulcers Psychiatry: Judgement and insight appear normal. Mood & affect appropriate.     Data Reviewed: I have personally reviewed following labs and imaging studies  CBC: Recent Labs  Lab 12/21/17 0626 12/25/17 1623 12/26/17 0646  WBC  --  10.3 11.9*  NEUTROABS  --  8.4*  --   HGB 12.6* 10.4* 9.7*  HCT 37.0* 33.7* 33.0*  MCV  --  74.4* 75.2*  PLT  --  273 973   Basic Metabolic Panel: Recent Labs  Lab 12/21/17 0626 12/25/17 1623 12/26/17 0646  NA 140 137 133*  K 4.4 4.7 5.8*  CL  --  95* 94*  CO2  --  25 21*  GLUCOSE 99 113* 101*  BUN  --  48* 56*  CREATININE  --  9.34* 10.52*  CALCIUM  --  8.6* 8.2*     Recent Results (from the past 240 hour(s))  MRSA PCR Screening     Status: None   Collection Time: 12/25/17 11:50 PM  Result Value Ref Range Status   MRSA by PCR NEGATIVE NEGATIVE Final    Comment:        The GeneXpert MRSA Assay (FDA approved for NASAL specimens only), is one component of a comprehensive MRSA colonization surveillance program. It is not intended to diagnose MRSA infection nor to guide or monitor treatment for MRSA infections. Performed at [facility], [place] [address], [place], [place] [address]          Radiology Studies: Dg Chest 2 View  Result Date: 12/26/2017 CLINICAL DATA:  Shortness of Breath EXAM: CHEST - 2 VIEW COMPARISON:  12/25/2017 FINDINGS: Right dialysis catheter is unchanged. Cardiomegaly with vascular congestion. Right lower lobe consolidation with moderate right effusion is similar to prior study. Cannot exclude pneumonia. IMPRESSION: Cardiomegaly, vascular congestion, stable. Continued right lower lobe consolidation with right effusion. Cannot exclude pneumonia. Electronically  Signed   By: Rolm Baptise M.D.   On: 12/26/2017 07:48   Dg Chest 2 View  Result Date: 12/25/2017 CLINICAL DATA:  Chest pain, dialysis patient, low-grade fever EXAM: CHEST - 2 VIEW COMPARISON:  12/21/2017 FINDINGS: Cardiomegaly with central vascular congestion. New small right pleural effusion with right basilar collapse/consolidation. Difficult to exclude right basilar pneumonia. No pneumothorax. Trachea is midline. Right IJ dialysis catheter tips SVC RA junction. IMPRESSION: New small right pleural effusion with right basilar collapse/consolidation. Pneumonia not excluded. Chronic cardiomegaly and vascular congestion Electronically Signed   By: Jerilynn Mages.  Shick M.D.   On: 12/25/2017 16:45   Ct Chest W Contrast  Result Date: 12/25/2017 CLINICAL DATA:  Cough and shortness of breath. EXAM: CT CHEST WITH CONTRAST TECHNIQUE: Multidetector CT imaging of the chest was performed during intravenous contrast administration. CONTRAST:  28mL OMNIPAQUE IOHEXOL 300 MG/ML  SOLN COMPARISON:  None. FINDINGS: Cardiovascular: Heart is enlarged. Tiny pericardial effusion evident. Right IJ central line tip is positioned in the upper to mid right atrium. Atherosclerotic calcification is noted in the wall of the thoracic aorta. Mediastinum/Nodes: Numerous tiny lymph nodes are seen in the mediastinum. 13 mm short axis prevascular node (67/4) is mildly enlarged. There is no hilar lymphadenopathy.  The esophagus has normal imaging features. Increased number of lymph nodes are seen in the axillary regions and subpectoral spaces bilaterally. Lungs/Pleura: The central tracheobronchial airways are patent. There is bibasilar dependent lower lobe collapse/consolidation, right greater than left. Small right pleural effusion associated. No overtly suspicious nodule or mass. Upper Abdomen: Unremarkable. Musculoskeletal: No worrisome lytic or sclerotic osseous abnormality. Bilateral gynecomastia. IMPRESSION: 1. Right lower lobe collapse/consolidation  with small right pleural effusion. 2. Minimal atelectasis posterior left lower lobe. 3. Increased number of nonenlarged lymph nodes in the mediastinum and axillary regions, nonspecific but potentially reactive. There is a single prevascular lymph node which is borderline to mildly enlarged at 13 mm short axis. Follow-up CT chest in 3 months could be used to ensure stability. 4. Cardiomegaly. 5.  Aortic Atherosclerois (ICD10-170.0) Electronically Signed   By: Misty Stanley M.D.   On: 12/25/2017 19:36        Scheduled Meds: . Chlorhexidine Gluconate Cloth  6 each Topical Q0600  . cinacalcet  30 mg Oral Q breakfast  . cloNIDine  0.1 mg Oral BID  . [START ON 12/29/2017] doxercalciferol  1 mcg Intravenous Q T,Th,Sa-HD  . fluticasone  2 spray Each Nare Daily  . labetalol  300 mg Oral BID  . losartan  50 mg Oral Daily  . multivitamin  1 tablet Oral Daily  . NIFEdipine  60 mg Oral BID  . pravastatin  80 mg Oral QHS  . predniSONE  5 mg Oral Q breakfast  . sevelamer carbonate  800 mg Oral TID WC  . sevelamer carbonate  800 mg Oral TID WC  . tacrolimus  5 mg Oral BID  . vancomycin variable dose per unstable renal function (pharmacist dosing)   Does not apply See admin instructions   Continuous Infusions: . ceFEPime (MAXIPIME) IV 2 g (12/26/17 2001)  . vancomycin 750 mg (12/26/17 1612)     LOS: 1 day     Vernell Leep, MD, FACP, [facility]. [facility] Pager [phone]  If 7PM-7AM, please contact night-coverage [email or web address] Password [email or web address] 12/27/2017, 4:11 PM

## 2017-12-27 NOTE — ED Provider Notes (Signed)
[facility] Provider Note   CSN: [id number] Arrival date & time: 12/25/17  1510     History   Chief Complaint Chief Complaint  Patient presents with  . Cough  . Shortness of Breath    HPI Ryan [name] is a 57 y.o. male.  HPI Patient presents to the emergency department with sudden onset shortness of breath with coughing started while holding his grandson today.  The patient states he is a dialysis patient did have a recent revision of his dialysis graft and fistulization.  The patient states that this was done yesterday.  The patient states that he has had something similar happen in the past where he became acutely short of breath.  Patient states that nothing seems to make the condition better.  The patient states he did not take any medications prior to arrival for symptoms.  The patient denies chest pain, headache,blurred vision, neck pain, fever,  weakness, numbness, dizziness, anorexia, edema, abdominal pain, nausea, vomiting, diarrhea, rash, back pain, dysuria, hematemesis, bloody stool, near syncope, or syncope. Past Medical History:  Diagnosis Date  . Allergy   . Blood transfusion without reported diagnosis   . Dialysis patient ([place])    Tues,thurs,sat  . ESRD (end stage renal disease) ([place])   . Hyperlipidemia   . Hypertension     Patient Active Problem List   Diagnosis Date Noted  . Acute respiratory failure with hypoxia ([place]) 12/25/2017  . Cough 10/30/2017  . 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 Chronic diastolic heart failure, NYHA class 3 ([place]) 10/18/2017  . Acute pulmonary edema ([facility])   . Hyperlipidemia 09/19/2017  . Shortness of breath 09/19/2017  . Acute respiratory distress 09/19/2017  . PND (paroxysmal nocturnal dyspnea) 09/14/2017  . DOE (dyspnea on exertion) 09/14/2017  . Chest pain with moderate risk for cardiac etiology 09/14/2017  . Recurrent right pleural effusion 08/28/2017  .  ESRD on dialysis ([place]) 08/27/2017  . Bloody drainage from penis 08/27/2017  . Lesion of vertebra 07/13/2017  . Renal transplant, status post 08/26/2016  . Hypertension 08/26/2016  . Anemia of chronic disease 04/09/2016  . EKG abnormalities   . 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 A/V FISTULAGRAM Left 12/15/2016   Procedure: A/V Fistulagram - left;  Surgeon: Angelia Mould, MD;  Location: [facility];  Service: Cardiovascular;  Laterality: Left;  . AV FISTULA PLACEMENT Left 08/28/2016   Procedure: LEFT UPPER  ARM ARTERIOVENOUS (AV) FISTULA CREATION;  Surgeon: Angelia Mould, MD;  Location: [facility];  Service: Vascular;  Laterality: Left;  . DIALYSIS/PERMA CATHETER INSERTION Right 12/21/2017   Procedure: INSERTION OF DIALYSIS CATHETER Right Internal Jugular .;  Surgeon: Elam Dutch, MD;  Location: [facility];  Service: Vascular;  Laterality: Right;  . FISTULA SUPERFICIALIZATION Left 09/23/2017   Procedure: FISTULA PLICATION LEFT ARM;  Surgeon: Elam Dutch, MD;  Location: [facility];  Service: Vascular;  Laterality: Left;  . FISTULOGRAM Left 12/21/2017   Procedure: FISTULOGRAM with Balloon Angioplasty.;  Surgeon: Elam Dutch, MD;  Location: [facility];  Service: Vascular;  Laterality: Left;  . IR THORACENTESIS ASP PLEURAL SPACE W/IMG GUIDE  08/22/2017   1.2 L -right-sided  . IR THORACENTESIS ASP PLEURAL SPACE W/IMG GUIDE  10/19/2017  . REVISON OF ARTERIOVENOUS FISTULA Left 09/21/1599   Procedure: PLICATION and Ligation of F LEFT ARM ARTERIOVENOUS FISTULA;  Surgeon: Elam Dutch, MD;  Location: [facility];  Service: Vascular;  Laterality: Left;  . stent in kidneys     dec 2017  . TRANSTHORACIC ECHOCARDIOGRAM  09/22/2017    Severe LVH.  Normal function -EF 55-60%.  GRII DD.  Moderate RV dilation with mildly reduced RV function.   Fixed right coronary cusp with very mild aortic stenosis.  The myocardium has a  speckled appearance --> .   Recommend cardiac MRI to evaluate for amyloid        Home Medications    Prior to Admission medications   Medication Sig Start Date End Date Taking? Authorizing Provider  azelastine (ASTELIN) 0.1 % nasal spray Place 1 spray into both nostrils 2 (two) times daily. Use in each nostril as directed 10/30/17  Yes Mannam, Praveen, MD  chlorpheniramine (CHLOR-TRIMETON) 4 MG tablet Take 2 tablets (8 mg total) by mouth 3 (three) times daily. Patient taking differently: Take 8 mg by mouth every 6 (six) hours as needed for allergies.  10/30/17  Yes Mannam, Praveen, MD  cinacalcet (SENSIPAR) 30 MG tablet Take 30 mg by mouth daily. 12/08/17  Yes [provider]  cloNIDine (CATAPRES) 0.1 MG tablet Take 1 tablet (0.1 mg total) by mouth 2 (two) times daily. 09/24/17  Yes Sheikh, Omair Latif, DO  fluticasone (FLONASE) 50 MCG/ACT nasal spray Place 2 sprays into both nostrils daily. 10/30/17  Yes Mannam, Praveen, MD  furosemide (LASIX) 80 MG tablet Take 1 tablet (80 mg total) by mouth 2 (two) times daily. 10/05/17  Yes Orma Flaming, MD  labetalol (NORMODYNE) 300 MG tablet Take 1 tablet (300 mg total) by mouth 2 (two) times daily. 10/22/17  Yes Leonie Man, MD  losartan (COZAAR) 50 MG tablet Take 1 tablet (50 mg total) by mouth daily. 10/22/17  Yes Leonie Man, MD  multivitamin (RENA-VIT) TABS tablet Take 1 tablet by mouth daily.   Yes [provider]  NIFEdipine (PROCARDIA XL/ADALAT-CC) 60 MG 24 hr tablet Take 60 mg by mouth 2 (two) times daily.   Yes [provider]  oxyCODONE-acetaminophen (PERCOCET) 5-325 MG tablet Take 1 tablet by mouth every 6 (six) hours as needed for severe pain. 12/21/17  Yes Rhyne, Hulen Shouts, PA-C  pravastatin (PRAVACHOL) 80 MG tablet Take 1 tablet (80 mg total) by mouth at bedtime. 12/02/17  Yes Orma Flaming, MD  predniSONE (DELTASONE) 5 MG tablet Take 5 mg by mouth daily with breakfast.   Yes [provider]  RENAGEL 800  MG tablet Take 2,400 mg by mouth 3 (three) times daily with meals.  12/14/17  Yes [provider]  sevelamer carbonate (RENVELA) 800 MG tablet Take 1 tablet (800 mg total) by mouth 3 (three) times daily with meals. 09/24/17  Yes Sheikh, Omair Latif, DO  tacrolimus (PROGRAF) 5 MG capsule Take 5 mg by mouth 2 (two) times daily.   Yes [provider]  dextromethorphan-guaiFENesin (ROBITUSSIN-DM) 10-100 MG/5ML liquid Take 10 mLs by mouth every 12 (twelve) hours as needed for cough.    [provider]  guaiFENesin (MUCINEX) 600 MG 12 hr tablet Take 2 tablets (1,200 mg total) by mouth 2 (two) times daily. Patient taking differently: Take 1,200 mg by mouth 2 (two) times daily as needed for cough.  09/24/17   Raiford Noble Latif, DO  lidocaine-prilocaine (EMLA) cream Apply 1 application topically once.  11/26/17   [provider]  loratadine (CLARITIN) 10 MG tablet Take 10 mg by mouth daily as needed for allergies.    [provider]  omeprazole (PRILOSEC) 20 MG capsule Take 1 capsule (20 mg total) by mouth daily. Patient not taking: Reported on 12/25/2017 10/30/17   Marshell Garfinkel, MD    Family History Family History  Problem Relation Age of Onset  . Hypertension Mother   . Heart disease Mother 21       By his report, he thinks that she had heart attack.  . Stroke Mother   . Cancer Father   . Kidney cancer Father   . Hypertension Sister   . Heart disease Maternal Grandmother   . Alcohol abuse Maternal Grandfather   . Mental illness Paternal Grandmother   . Learning disabilities Paternal Grandmother        Alzheimer's   . Stroke Paternal Grandfather   . Colon cancer Neg Hx   . Colon polyps Neg Hx   . Esophageal cancer Neg Hx   . Rectal cancer Neg Hx   . Stomach cancer Neg Hx     Social History Social History   Tobacco Use  . Smoking status: Never Smoker  . Smokeless tobacco: Never Used  Substance Use Topics  . Alcohol use: No  . Drug use: No      Allergies   Azithromycin   Review of Systems Review of Systems All other systems negative except as documented in the HPI. All pertinent positives and negatives as reviewed in the HPI.  Physical Exam Updated Vital Signs BP 132/83 (BP Location: Right Arm)   Pulse 78   Temp 98.9 F (37.2 C) (Oral)   Resp 18   Ht 5\' 9"  (1.753 m)   Wt 74.7 kg   SpO2 96%   BMI 24.32 kg/m   Physical Exam  Constitutional: He is oriented to person, place, and time. He appears well-developed and well-nourished. No distress.  HENT:  Head: Normocephalic and atraumatic.  Mouth/Throat: Oropharynx is clear and moist.  Eyes: Pupils are equal, round, and reactive to light.  Neck: Normal range of motion. Neck supple.  Cardiovascular: Normal rate, regular rhythm and normal heart sounds. Exam reveals no gallop and no friction rub.  No murmur heard. Pulmonary/Chest: Effort normal. No respiratory distress. He has decreased breath sounds in the right lower field. He has no wheezes. He has no rhonchi. He has no rales.  Abdominal: Soft. Bowel sounds are normal. He exhibits no distension. There is no tenderness.  Neurological: He is alert and oriented to person, place, and time. He exhibits normal muscle tone. Coordination normal.  Skin: Skin is warm and dry. Capillary refill takes less than 2 seconds. No rash noted. No erythema.  Psychiatric: He has a normal mood and affect. His behavior is normal.  Nursing note and vitals reviewed.    ED Treatments / Results  Labs (all labs ordered are listed, but only abnormal results are displayed) Labs Reviewed  BASIC METABOLIC PANEL - Abnormal; Notable for the following components:      Result Value   Chloride 95 (*)    Glucose, Bld 113 (*)    BUN 48 (*)    Creatinine, Ser 9.34 (*)    Calcium 8.6 (*)    GFR calc non Af Amer 6 (*)    GFR calc Af Amer 6 (*)    Anion gap 17 (*)    All other components within normal limits  CBC WITH DIFFERENTIAL/PLATELET -  Abnormal; Notable for the following components:   Hemoglobin 10.4 (*)    HCT 33.7 (*)    MCV 74.4 (*)  MCH 23.0 (*)    RDW 22.7 (*)    Neutro Abs 8.4 (*)    All other components within normal limits  BASIC METABOLIC PANEL - Abnormal; Notable for the following components:   Sodium 133 (*)    Potassium 5.8 (*)    Chloride 94 (*)    CO2 21 (*)    Glucose, Bld 101 (*)    BUN 56 (*)    Creatinine, Ser 10.52 (*)    Calcium 8.2 (*)    GFR calc non Af Amer 5 (*)    GFR calc Af Amer 6 (*)    Anion gap 18 (*)    All other components within normal limits  CBC - Abnormal; Notable for the following components:   WBC 11.9 (*)    Hemoglobin 9.7 (*)    HCT 33.0 (*)    MCV 75.2 (*)    MCH 22.1 (*)    MCHC 29.4 (*)    RDW 23.1 (*)    All other components within normal limits  MRSA PCR SCREENING  HIV ANTIBODY (ROUTINE TESTING W REFLEX)  HEPATITIS B SURFACE ANTIGEN    EKG EKG Interpretation  Date/Time:  Friday December 25 2017 15:29:27 EDT Ventricular Rate:  94 PR Interval:    QRS Duration: 95 QT Interval:  369 QTC Calculation: 462 R Axis:   99 Text Interpretation:  Sinus rhythm Borderline right axis deviation When compared with ECG of 10/18/2017, Nonspecific T wave abnormality is no longer present Confirmed by Delora Fuel ([id number]) on 12/25/2017 11:03:13 PM   Radiology Dg Chest 2 View  Result Date: 12/26/2017 CLINICAL DATA:  Shortness of Breath EXAM: CHEST - 2 VIEW COMPARISON:  12/25/2017 FINDINGS: Right dialysis catheter is unchanged. Cardiomegaly with vascular congestion. Right lower lobe consolidation with moderate right effusion is similar to prior study. Cannot exclude pneumonia. IMPRESSION: Cardiomegaly, vascular congestion, stable. Continued right lower lobe consolidation with right effusion. Cannot exclude pneumonia. Electronically Signed   By: Rolm Baptise M.D.   On: 12/26/2017 07:48   Dg Chest 2 View  Result Date: 12/25/2017 CLINICAL DATA:  Chest pain, dialysis patient,  low-grade fever EXAM: CHEST - 2 VIEW COMPARISON:  12/21/2017 FINDINGS: Cardiomegaly with central vascular congestion. New small right pleural effusion with right basilar collapse/consolidation. Difficult to exclude right basilar pneumonia. No pneumothorax. Trachea is midline. Right IJ dialysis catheter tips SVC RA junction. IMPRESSION: New small right pleural effusion with right basilar collapse/consolidation. Pneumonia not excluded. Chronic cardiomegaly and vascular congestion Electronically Signed   By: Jerilynn Mages.  Shick M.D.   On: 12/25/2017 16:45   Ct Chest W Contrast  Result Date: 12/25/2017 CLINICAL DATA:  Cough and shortness of breath. EXAM: CT CHEST WITH CONTRAST TECHNIQUE: Multidetector CT imaging of the chest was performed during intravenous contrast administration. CONTRAST:  19mL OMNIPAQUE IOHEXOL 300 MG/ML  SOLN COMPARISON:  None. FINDINGS: Cardiovascular: Heart is enlarged. Tiny pericardial effusion evident. Right IJ central line tip is positioned in the upper to mid right atrium. Atherosclerotic calcification is noted in the wall of the thoracic aorta. Mediastinum/Nodes: Numerous tiny lymph nodes are seen in the mediastinum. 13 mm short axis prevascular node (67/4) is mildly enlarged. There is no hilar lymphadenopathy. The esophagus has normal imaging features. Increased number of lymph nodes are seen in the axillary regions and subpectoral spaces bilaterally. Lungs/Pleura: The central tracheobronchial airways are patent. There is bibasilar dependent lower lobe collapse/consolidation, right greater than left. Small right pleural effusion associated. No overtly suspicious nodule or mass. Upper Abdomen:  Unremarkable. Musculoskeletal: No worrisome lytic or sclerotic osseous abnormality. Bilateral gynecomastia. IMPRESSION: 1. Right lower lobe collapse/consolidation with small right pleural effusion. 2. Minimal atelectasis posterior left lower lobe. 3. Increased number of nonenlarged lymph nodes in the  mediastinum and axillary regions, nonspecific but potentially reactive. There is a single prevascular lymph node which is borderline to mildly enlarged at 13 mm short axis. Follow-up CT chest in 3 months could be used to ensure stability. 4. Cardiomegaly. 5.  Aortic Atherosclerois (ICD10-170.0) Electronically Signed   By: Misty Stanley M.D.   On: 12/25/2017 19:36    Procedures Procedures (including critical care time)  Medications Ordered in ED Medications  sodium chloride 0.9 % injection (has no administration in time range)  iopamidol (ISOVUE-370) 76 % injection (has no administration in time range)  oxyCODONE-acetaminophen (PERCOCET/ROXICET) 5-325 MG per tablet 1 tablet (1 tablet Oral Given 12/26/17 0039)  cloNIDine (CATAPRES) tablet 0.1 mg (0.1 mg Oral Given 12/26/17 1949)  labetalol (NORMODYNE) tablet 300 mg (300 mg Oral Given 12/26/17 1949)  losartan (COZAAR) tablet 50 mg (has no administration in time range)  NIFEdipine (PROCARDIA XL/ADALAT-CC) 24 hr tablet 60 mg (60 mg Oral Given 12/26/17 2152)  pravastatin (PRAVACHOL) tablet 80 mg (80 mg Oral Given 12/26/17 2152)  cinacalcet (SENSIPAR) tablet 30 mg (30 mg Oral Given 12/26/17 0953)  predniSONE (DELTASONE) tablet 5 mg (5 mg Oral Given 12/26/17 0954)  sevelamer carbonate (RENVELA) tablet 800 mg (800 mg Oral Not Given 12/26/17 1147)  sevelamer carbonate (RENVELA) tablet 800 mg (800 mg Oral Given 12/26/17 1147)  tacrolimus (PROGRAF) capsule 5 mg (5 mg Oral Given 12/26/17 2152)  multivitamin (RENA-VIT) tablet 1 tablet (1 tablet Oral Given 12/26/17 0955)  dextromethorphan-guaiFENesin (ROBITUSSIN-DM) 10-100 MG/5ML liquid 10 mL (has no administration in time range)  fluticasone (FLONASE) 50 MCG/ACT nasal spray 2 spray (2 sprays Each Nare Given 12/26/17 1023)  guaiFENesin (MUCINEX) 12 hr tablet 1,200 mg (has no administration in time range)  loratadine (CLARITIN) tablet 10 mg (has no administration in time range)  acetaminophen (TYLENOL) tablet 650 mg  (has no administration in time range)    Or  acetaminophen (TYLENOL) suppository 650 mg (has no administration in time range)  ondansetron (ZOFRAN) tablet 4 mg (has no administration in time range)    Or  ondansetron (ZOFRAN) injection 4 mg (has no administration in time range)  vancomycin variable dose per unstable renal function (pharmacist dosing) (has no administration in time range)  Chlorhexidine Gluconate Cloth 2 % PADS 6 each (6 each Topical Given 12/26/17 0954)  HYDROmorphone (DILAUDID) injection 1-2 mg (2 mg Intravenous Given 12/26/17 1956)  vancomycin (VANCOCIN) IVPB 750 mg/150 ml premix (750 mg Intravenous New Bag/Given 12/26/17 1612)  ceFEPIme (MAXIPIME) 2 g in sodium chloride 0.9 % 100 mL IVPB (2 g Intravenous New Bag/Given 12/26/17 2001)  Vancomycin (VANCOCIN) 750-5 MG/150ML-% IVPB (has no administration in time range)  doxercalciferol (HECTOROL) injection 1 mcg (1 mcg Intravenous Given 12/26/17 1832)  doxercalciferol (HECTOROL) 4 MCG/2ML injection (has no administration in time range)  albuterol (PROVENTIL) (2.5 MG/3ML) 0.083% nebulizer solution 5 mg (5 mg Nebulization Given 12/25/17 1547)  iohexol (OMNIPAQUE) 300 MG/ML solution 75 mL (75 mLs Intravenous Contrast Given 12/25/17 1918)  ceFEPIme (MAXIPIME) 1 g in sodium chloride 0.9 % 100 mL IVPB (0 g Intravenous Stopped 12/25/17 2354)  vancomycin (VANCOCIN) 2,000 mg in sodium chloride 0.9 % 500 mL IVPB (2,000 mg Intravenous New Bag/Given 12/26/17 0019)  heparin 1000 UNIT/ML injection (1,000 Units  Given 12/26/17 1815)  Initial Impression / Assessment and Plan / ED Course  I have reviewed the triage vital signs and the nursing notes.  Pertinent labs & imaging results that were available during my care of the patient were reviewed by me and considered in my medical decision making (see chart for details).     Patient is saturating in the low 90s upper 80s.  I feel that he will need admission to the hospital for further evaluation  and based on his CT scan findings he had does have collapse of the right lower lobe of the lung.  Patient is advised the plan and all questions were answered.  The patient did not require BiPAP at this time.  Placed on supplemental oxygen.  Talked with the [facility] about admitting the patient.  Final Clinical Impressions(s) / ED Diagnoses   Final diagnoses:  SOB (shortness of breath)    ED Discharge Orders    None       Dalia Heading, PA-C 12/27/17 0054    Maudie Flakes, MD 12/27/17 2223

## 2017-12-28 ENCOUNTER — Encounter: Payer: Self-pay | Admitting: Nephrology

## 2017-12-28 LAB — CBC
HCT: 32.9 % — ABNORMAL LOW (ref 39.0–52.0)
HEMOGLOBIN: 10 g/dL — AB (ref 13.0–17.0)
MCH: 22.2 pg — AB (ref 26.0–34.0)
MCHC: 30.4 g/dL (ref 30.0–36.0)
MCV: 73.1 fL — ABNORMAL LOW (ref 78.0–100.0)
PLATELETS: 243 10*3/uL (ref 150–400)
RBC: 4.5 MIL/uL (ref 4.22–5.81)
RDW: 22.5 % — AB (ref 11.5–15.5)
WBC: 10.5 10*3/uL (ref 4.0–10.5)

## 2017-12-28 LAB — RENAL FUNCTION PANEL
ALBUMIN: 2.6 g/dL — AB (ref 3.5–5.0)
Anion gap: 15 (ref 5–15)
BUN: 71 mg/dL — AB (ref 6–20)
CALCIUM: 7.7 mg/dL — AB (ref 8.9–10.3)
CO2: 21 mmol/L — AB (ref 22–32)
CREATININE: 10.42 mg/dL — AB (ref 0.61–1.24)
Chloride: 91 mmol/L — ABNORMAL LOW (ref 98–111)
GFR calc Af Amer: 6 mL/min — ABNORMAL LOW (ref >60)
GFR calc non Af Amer: 5 mL/min — ABNORMAL LOW (ref >60)
GLUCOSE: 118 mg/dL — AB (ref 70–99)
PHOSPHORUS: 6.8 mg/dL — AB (ref 2.5–4.6)
Potassium: 4.8 mmol/L (ref 3.5–5.1)
SODIUM: 127 mmol/L — AB (ref 135–145)

## 2017-12-28 MED ORDER — SODIUM CHLORIDE 0.9 % IV SOLN
INTRAVENOUS | Status: DC | PRN
  Administered 2017-12-28: 250 mL via INTRAVENOUS
  Administered 2017-12-30: 04:00:00 via INTRAVENOUS

## 2017-12-28 MED ORDER — VANCOMYCIN HCL IN DEXTROSE 750-5 MG/150ML-% IV SOLN
750.0000 mg | Freq: Once | INTRAVENOUS | Status: AC
  Administered 2017-12-28: 750 mg via INTRAVENOUS
  Filled 2017-12-28: qty 150

## 2017-12-28 MED ORDER — CHLORHEXIDINE GLUCONATE CLOTH 2 % EX PADS
6.0000 | MEDICATED_PAD | Freq: Every day | CUTANEOUS | Status: DC
  Administered 2017-12-29: 6 via TOPICAL

## 2017-12-28 MED ORDER — HEPARIN SODIUM (PORCINE) 1000 UNIT/ML IJ SOLN
INTRAMUSCULAR | Status: AC
  Administered 2017-12-28: 1000 [IU]
  Filled 2017-12-28: qty 1

## 2017-12-28 MED ORDER — SODIUM CHLORIDE 0.9 % IV SOLN: 100.0000 mL | INTRAVENOUS | Status: DC | PRN

## 2017-12-28 MED ORDER — LIDOCAINE-PRILOCAINE 2.5-2.5 % EX CREA: 1.0000 "application " | TOPICAL_CREAM | CUTANEOUS | Status: DC | PRN

## 2017-12-28 MED ORDER — PENTAFLUOROPROP-TETRAFLUOROETH EX AERO: 1.0000 "application " | INHALATION_SPRAY | CUTANEOUS | Status: DC | PRN

## 2017-12-28 MED ORDER — HEPARIN SODIUM (PORCINE) 1000 UNIT/ML DIALYSIS: 1000.0000 [IU] | INTRAMUSCULAR | Status: DC | PRN

## 2017-12-28 MED ORDER — LIDOCAINE HCL (PF) 1 % IJ SOLN: 5.0000 mL | INTRAMUSCULAR | Status: DC | PRN

## 2017-12-28 MED ORDER — ALTEPLASE 2 MG IJ SOLR: 2.0000 mg | Freq: Once | INTRAMUSCULAR | Status: DC | PRN

## 2017-12-28 MED ORDER — SODIUM CHLORIDE 0.9 % IV SOLN
2.0000 g | Freq: Once | INTRAVENOUS | Status: AC
  Administered 2017-12-28: 2 g via INTRAVENOUS
  Filled 2017-12-28: qty 2

## 2017-12-28 NOTE — Progress Notes (Signed)
PROGRESS NOTE   Ryan [name]  FUX:[id number]    DOB: December 25, 1960    DOA: 12/25/2017  PCP: Orma Flaming, MD   I have briefly reviewed patients previous medical records in [facility].  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  CT chest showed right lower lobe collapse/consolidation and small pleural effusion, mediastinal lymphadenopathy felt to be reactive and admitted for suspected HCAP.   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  Empirically started on IV vancomycin/cefepime, continue.  Supportive treatment.  Slowly improving, decreased pain and dyspnea.  Continue current management. I reviewed the CT chest with a radiologist on 10/7: He indicated that the findings in the right lower lobe look more like small atelectasis from the small pleural effusion rather than pneumonic consolidation which however cannot be completely excluded.  Also no central PE but this was not a PE protocol study.  ESRD on TTS HD: With suspected volume overload.  Nephrology consulting.  Underwent HD 10/5 and 5 L fluid removed with some improvement in dyspnea.  On 9/30: Underwent left arm fistulogram,  angioplasty of left arm AV fistula x3 and attempted plication of left arm AV fistula, ligation of left arm AV fistula and insertion of right IJ catheter.  Due to volume overload, underwent off schedule HD 10/7, back on his usual TTS schedule from 10/8.  Nephrology following.  Hyperkalemia: Potassium 5.8 on 10/5.  Resolved after dialysis.  Acute respiratory failure with hypoxia: Secondary to pneumonia and pulmonary edema.  Nephrology continues to manage volume across HD.  Currently saturating at 94% on 2 L oxygen.  Wean as tolerated.  Essential hypertension: Mildly uncontrolled.  Volume management across HD.  Continue labetalol, losartan, clonidine and nifedipine.  Given polypharmacy, suspect difficult to control.  No change.  Anemia in ESRD: Stable.  Failed renal transplant: Continue prednisone, tacrolimus.  Restrictive cardiomyopathy with amyloidosis: Follows with outpatient cardiology and nephrology.  Lasix held.  Mediastinal/prevascular lymphadenopathy: Outpatient follow-up.     DVT prophylaxis: SCDs Code Status: Full Family Communication: None at bedside Disposition: DC home pending clinical improvement, hopefully after dialysis 10/8.   Consultants:  Nephrology  Procedures:  HD  Antimicrobials:  IV cefepime and vancomycin   Subjective: Patient seen this morning at HD.  Reports feeling better.  Right-sided pleuritic chest pain almost 50% reduced.  Dyspnea with exertion.  No cough.  ROS: As above.  Objective:  Vitals:   12/28/17 1000 12/28/17 1030 12/28/17 1045 12/28/17 1152  BP: (!) 147/77 (!) 122/39 138/83 127/72  Pulse: 93 93 94 98  Resp:   17 18  Temp:   (!) 96.8 F (36 C) 98.6 F (37 C)  TempSrc:   Oral Oral  SpO2:   95% 94%  Weight:   71.7 kg   Height:  Examination:  General exam: Pleasant young male, moderately built and nourished, lying comfortably propped up in bed undergoing HD Respiratory system: Reduced breath sounds in the bases, right >  left.  Right basal bronchial breath sounds.  Occasional basal crackles.  Unable to appreciate pleural rub today.  Able to take deeper breaths today.  Right IJ HD catheter. Cardiovascular system: S1 & S2 heard, RRR. No JVD, murmurs, rubs, gallops or clicks. No pedal edema.  Telemetry personally reviewed: Sinus rhythm. Gastrointestinal system: Abdomen is nondistended, soft and nontender. No organomegaly or masses felt. Normal bowel sounds heard.  Stable Central nervous system: Alert and oriented. No focal neurological deficits.  Stable Extremities: Symmetric 5 x 5 power.  Left upper arm AV fistula. Skin: No rashes, lesions or ulcers Psychiatry: Judgement and insight appear normal. Mood & affect appropriate.     Data Reviewed: I have personally reviewed following labs and imaging studies  CBC: Recent Labs  Lab 12/25/17 1623 12/26/17 0646 12/28/17 0737  WBC 10.3 11.9* 10.5  NEUTROABS 8.4*  --   --   HGB 10.4* 9.7* 10.0*  HCT 33.7* 33.0* 32.9*  MCV 74.4* 75.2* 73.1*  PLT 273 229 856   Basic Metabolic Panel: Recent Labs  Lab 12/25/17 1623 12/26/17 0646 12/28/17 0737  NA 137 133* 127*  K 4.7 5.8* 4.8  CL 95* 94* 91*  CO2 25 21* 21*  GLUCOSE 113* 101* 118*  BUN 48* 56* 71*  CREATININE 9.34* 10.52* 10.42*  CALCIUM 8.6* 8.2* 7.7*  PHOS  --   --  6.8*     Recent Results (from the past 240 hour(s))  MRSA PCR Screening     Status: None   Collection Time: 12/25/17 11:50 PM  Result Value Ref Range Status   MRSA by PCR NEGATIVE NEGATIVE Final    Comment:        The GeneXpert MRSA Assay (FDA approved for NASAL specimens only), is one component of a comprehensive MRSA colonization surveillance program. It is not intended to diagnose MRSA infection nor to guide or monitor treatment for MRSA infections. Performed at [facility], [place] [address] [place], [place] [address]          Radiology Studies: No results found.      Scheduled Meds: . Chlorhexidine  Gluconate Cloth  6 each Topical Q0600  . Chlorhexidine Gluconate Cloth  6 each Topical Q0600  . cinacalcet  30 mg Oral Q breakfast  . cloNIDine  0.1 mg Oral BID  . [START ON 12/29/2017] doxercalciferol  1 mcg Intravenous Q T,Th,Sa-HD  . fluticasone  2 spray Each Nare Daily  . labetalol  300 mg Oral BID  . losartan  50 mg Oral Daily  . multivitamin  1 tablet Oral Daily  . NIFEdipine  60 mg Oral BID  . pravastatin  80 mg Oral QHS  . predniSONE  5 mg Oral Q breakfast  . sevelamer carbonate  800 mg Oral TID WC  . tacrolimus  5 mg Oral BID  . vancomycin variable dose per unstable renal function (pharmacist dosing)   Does not apply See admin instructions   Continuous Infusions: . ceFEPime (MAXIPIME) IV 2 g (12/26/17 2001)  . vancomycin 750 mg (12/26/17 1612)     LOS: 2 days     Vernell Leep, MD, FACP, [facility]. [facility] Pager [phone]  If 7PM-7AM, please contact night-coverage [email or web address] Password [email or web address] 12/28/2017, 12:43 PM

## 2017-12-28 NOTE — Progress Notes (Signed)
Pharmacy Antibiotic Note  Ryan [name] is a 57 y.o. male admitted on 12/25/2017 with pneumonia.  Pharmacy has been consulted for Vancomycin and Cefepime dosing.  Pt has ESRD with dialysis typ[facility] TTS.  Pt underwent extra HD session 10/7 for volume removal (3.5hr at BFR 400).  Will give additional abx doses as indicated.  Plan: Vanc 750mg  IV x 1 ordered Cefepime 2gm IV x 1 ordered  Plan for next HD 10/8 on schedule, then d/c home. Vancomycin 750mg  qHD Tues/Thurs/Sat Cefepime 2 Gm qHD Tues/Thurs/Sat  Height: 5\' 9"  (175.3 cm) Weight: 158 lb 1.1 oz (71.7 kg) IBW/kg (Calculated) : 70.7  Temp (24hrs), Avg:98.4 F (36.9 C), Min:96.8 F (36 C), Max:99.2 F (37.3 C)  Recent Labs  Lab 12/25/17 1623 12/26/17 0646 12/28/17 0737  WBC 10.3 11.9* 10.5  CREATININE 9.34* 10.52* 10.42*    Estimated Creatinine Clearance: 7.9 mL/min (A) (by C-G formula based on SCr of 10.42 mg/dL (H)).    Allergies  Allergen Reactions  . Azithromycin Nausea And Vomiting and Other (See Comments)    Chest tightness    Antimicrobials this admission: Cefepime 10/4 >>  Vanc  10/4 >>   Dose adjustments this admission: Extra doses given 10/7 with extra HD session  Microbiology results: 10/5 MRSA PCR: negative  Thank you for allowing pharmacy to be a part of this patient's care.  [employer], Pharm.D., BCPS Clinical Pharmacist Pager: [phone] Clinical phone for 12/28/2017 from 8:30-4:00 is [phone].  **Pharmacist phone directory can now be found on [email or web address] (PW [email or web address]).  Listed under [place].  12/28/2017 1:57 PM

## 2017-12-28 NOTE — Progress Notes (Signed)
[facility] Progress Note   Subjective:  Still SOB without O2. Pain less intense today, able to move around a little more. No new complaints.    Objective Vitals:   12/28/17 0050 12/28/17 0545 12/28/17 0720 12/28/17 0730  BP: 129/80 127/73 (!) 162/87 (!) 147/86  Pulse: 88 87 93 90  Resp: 20 18 (!) 28 20  Temp: 99.2 F (37.3 C) 98.3 F (36.8 C) 98.4 F (36.9 C)   TempSrc: Oral Oral Oral   SpO2: 95% 91% 95%   Weight:  77.4 kg 77.5 kg   Height:       Physical Exam General: WNWD male sitting up in bed NAD on nasal O2 Heart: RRR no murmur Lungs: +egophony in R base, breath sounds decreased Abdomen: +BS soft NT/ND Extremities: No LE edema  Dialysis Access: R IJ TDC; recent unsuccessful plication of LA AVF no bruit   home meds:  - clonidine 0.1 bid/ nifedipine 60 bid/ labetalol 300 bid/ losartan 50 qd  - tacrolimus 5 bid/ prednisone 5 qd  - cinacalcet 30 qd/ sevelamer carbonate 800 tid ac  - pravastatin 80 hs/ omeprazole 20 qd  - prns/ vitamins/ oxy IR prn  Dialysis: [facility]  4h  72.5kg  R IJ TDC  2/2 bath  Hep none  - hect 1 ug tiw  - mircera 225 q 2, last 9/26   Assessment/Plan: 1. CP/SOB 2/2 RLL PNA, also some degree of fluid overload. On IV Vanc/cefepime. Extra HD today. Net UF goal 3.5L.   2. ESRD - HD TTS. Extra HD today, resume regular TTS schedule tomorrow, orders written for 3-3.5hrs. 3. HTN/volume  - BP elevated today, expect improvement post HD. 4 BP meds at home.   4. Anemia- Hgb^10.0. ESA recently dosed as outpatient  5. MBD- Ca ok. Continue VDRA/binders/sensipar  6. Hx failed renal Tx  - On Prograf   Kelly Splinter MD [facility] pager [phone]   12/28/2017, 9:06 AM    Additional Objective Labs: Basic Metabolic Panel: Recent Labs  Lab 12/25/17 1623 12/26/17 0646 12/28/17 0737  NA 137 133* 127*  K 4.7 5.8* 4.8  CL 95* 94* 91*  CO2 25 21* 21*  GLUCOSE 113* 101* 118*  BUN 48* 56* 71*  CREATININE 9.34* 10.52*  10.42*  CALCIUM 8.6* 8.2* 7.7*  PHOS  --   --  6.8*   CBC: Recent Labs  Lab 12/25/17 1623 12/26/17 0646 12/28/17 0737  WBC 10.3 11.9* 10.5  NEUTROABS 8.4*  --   --   HGB 10.4* 9.7* 10.0*  HCT 33.7* 33.0* 32.9*  MCV 74.4* 75.2* 73.1*  PLT 273 229 243   Blood Culture    Component Value Date/Time   SDES FLUID RIGHT PLEURAL 10/19/2017 1059   SPECREQUEST NO BACTERIA CULTURE ORDERED,PER DR Eliseo Squires 10/19/2017 1059   CULT NO GROWTH 10/07/2016 0907   REPTSTATUS 10/19/2017 FINAL 10/19/2017 1059    Cardiac Enzymes: No results for input(s): CKTOTAL, CKMB, CKMBINDEX, TROPONINI in the last 168 hours. CBG: No results for input(s): GLUCAP in the last 168 hours. Iron Studies: No results for input(s): IRON, TIBC, TRANSFERRIN, FERRITIN in the last 72 hours. Lab Results  Component Value Date   INR 1.13 09/23/2017   INR 1.01 08/28/2016   Medications: . sodium chloride    . sodium chloride    . ceFEPime (MAXIPIME) IV 2 g (12/26/17 2001)  . vancomycin 750 mg (12/26/17 1612)   . Chlorhexidine Gluconate Cloth  6 each Topical Q0600  . cinacalcet  30 mg Oral Q breakfast  . cloNIDine  0.1 mg Oral BID  . [START ON 12/29/2017] doxercalciferol  1 mcg Intravenous Q T,Th,Sa-HD  . fluticasone  2 spray Each Nare Daily  . labetalol  300 mg Oral BID  . losartan  50 mg Oral Daily  . multivitamin  1 tablet Oral Daily  . NIFEdipine  60 mg Oral BID  . pravastatin  80 mg Oral QHS  . predniSONE  5 mg Oral Q breakfast  . sevelamer carbonate  800 mg Oral TID WC  . tacrolimus  5 mg Oral BID  . vancomycin variable dose per unstable renal function (pharmacist dosing)   Does not apply See admin instructions

## 2017-12-28 NOTE — Progress Notes (Signed)
Cardiac monitor d/c and med-surge bed requested per MD's order.   Vikrant Pryce, RN

## 2017-12-29 ENCOUNTER — Inpatient Hospital Stay (HOSPITAL_COMMUNITY): Payer: Medicare Other

## 2017-12-29 DIAGNOSIS — J189 Pneumonia, unspecified organism: Secondary | ICD-10-CM

## 2017-12-29 LAB — CBC
HEMATOCRIT: 33.8 % — AB (ref 39.0–52.0)
Hemoglobin: 10.3 g/dL — ABNORMAL LOW (ref 13.0–17.0)
MCH: 22.2 pg — ABNORMAL LOW (ref 26.0–34.0)
MCHC: 30.5 g/dL (ref 30.0–36.0)
MCV: 72.7 fL — ABNORMAL LOW (ref 80.0–100.0)
NRBC: 0.2 % (ref 0.0–0.2)
PLATELETS: 278 10*3/uL (ref 150–400)
RBC: 4.65 MIL/uL (ref 4.22–5.81)
RDW: 22.5 % — AB (ref 11.5–15.5)
WBC: 10.2 10*3/uL (ref 4.0–10.5)

## 2017-12-29 LAB — RENAL FUNCTION PANEL
ALBUMIN: 2.5 g/dL — AB (ref 3.5–5.0)
Anion gap: 18 — ABNORMAL HIGH (ref 5–15)
BUN: 58 mg/dL — AB (ref 6–20)
CALCIUM: 8.9 mg/dL (ref 8.9–10.3)
CO2: 23 mmol/L (ref 22–32)
Chloride: 89 mmol/L — ABNORMAL LOW (ref 98–111)
Creatinine, Ser: 8.36 mg/dL — ABNORMAL HIGH (ref 0.61–1.24)
GFR calc Af Amer: 7 mL/min — ABNORMAL LOW (ref >60)
GFR, EST NON AFRICAN AMERICAN: 6 mL/min — AB (ref >60)
GLUCOSE: 109 mg/dL — AB (ref 70–99)
Phosphorus: 7.1 mg/dL — ABNORMAL HIGH (ref 2.5–4.6)
Potassium: 4.9 mmol/L (ref 3.5–5.1)
SODIUM: 130 mmol/L — AB (ref 135–145)

## 2017-12-29 MED ORDER — LIDOCAINE-PRILOCAINE 2.5-2.5 % EX CREA
1.0000 "application " | TOPICAL_CREAM | CUTANEOUS | Status: DC | PRN
  Filled 2017-12-29: qty 5

## 2017-12-29 MED ORDER — ALTEPLASE 2 MG IJ SOLR: 2.0000 mg | Freq: Once | INTRAMUSCULAR | Status: DC | PRN

## 2017-12-29 MED ORDER — SODIUM CHLORIDE 0.9 % IV SOLN: 100.0000 mL | INTRAVENOUS | Status: DC | PRN

## 2017-12-29 MED ORDER — PENTAFLUOROPROP-TETRAFLUOROETH EX AERO: 1.0000 "application " | INHALATION_SPRAY | CUTANEOUS | Status: DC | PRN

## 2017-12-29 MED ORDER — AMOXICILLIN-POT CLAVULANATE 500-125 MG PO TABS: 1.0000 | ORAL_TABLET | Freq: Every day | ORAL | 0 refills | Status: DC

## 2017-12-29 MED ORDER — HEPARIN SODIUM (PORCINE) 1000 UNIT/ML DIALYSIS
1000.0000 [IU] | INTRAMUSCULAR | Status: DC | PRN
  Administered 2017-12-30: 1000 [IU] via INTRAVENOUS_CENTRAL
  Filled 2017-12-29 (×2): qty 1

## 2017-12-29 MED ORDER — CHLORPHENIRAMINE MALEATE 4 MG PO TABS: 8.0000 mg | ORAL_TABLET | Freq: Four times a day (QID) | ORAL | Status: DC | PRN

## 2017-12-29 MED ORDER — GUAIFENESIN ER 600 MG PO TB12: 1200.0000 mg | ORAL_TABLET | Freq: Two times a day (BID) | ORAL | Status: DC | PRN

## 2017-12-29 MED ORDER — SODIUM CHLORIDE 0.9 % IV SOLN
2.0000 g | INTRAVENOUS | Status: DC
  Administered 2017-12-30: 2 g via INTRAVENOUS
  Filled 2017-12-29: qty 2

## 2017-12-29 MED ORDER — LIDOCAINE HCL (PF) 1 % IJ SOLN: 5.0000 mL | INTRAMUSCULAR | Status: DC | PRN

## 2017-12-29 NOTE — Progress Notes (Signed)
RN spoke to dialysis RN Tori. States she will send for patient in the next hour. Patient is aware of plan. Patient states he does not want to be discharged tonight after dialysis. No other complaints a t this time. Will continue to monitor patient.

## 2017-12-29 NOTE — Care Management Important Message (Signed)
Important Message  Patient Details  Name: Ryan [name] MRN: [id number] Date of Birth: 1960-12-05   Medicare Important Message Given:  Yes    Dawnette Mione P Raybon Conard 12/29/2017, 3:17 PM

## 2017-12-29 NOTE — Discharge Summary (Signed)
Physician Discharge Summary  Ryan [name] BMW:[id number] DOB: Jun 20, 1960  PCP: Orma Flaming, MD  Admit date: 12/25/2017 Discharge date: 12/29/2017  Recommendations for Outpatient Follow-up:  1. Dr. Orma Flaming, PCP in 1 week. 2. Hemodialysis Center: Continue regular hemodialysis on Tuesdays, Thursdays and Saturdays. 3. Dr. Marshell Garfinkel, Pulmonology: Has an appointment on 12/30/2017 at 11 AM.  However if he misses this appointment, recommend rescheduling it for further evaluation. 4. Recommend follow-up chest x-ray in 4 weeks.  Home Health: PT, RN and aide Equipment/Devices: None  Discharge Condition: Improved and stable. CODE STATUS: Full Diet recommendation: Heart healthy diet.  Discharge Diagnoses:  Principal Problem:   Acute respiratory failure with hypoxia ([place]) Active Problems:   Hypertension   Anemia of chronic disease   ESRD on dialysis 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  CT chest showed right lower lobe collapse/consolidation and small pleural effusion, mediastinal lymphadenopathy felt to be reactive and admitted for suspected HCAP.   Assessment & Plan:   Suspected lobar pneumonia/RLL HCAP/pleurisy: CT chest 12/25/2017: RLL collapse/consolidation with small right pleural effusion.  Empirically started on IV vancomycin/cefepime across HD and completed 4 days.  Supportive treatment.  I reviewed the CT  chest with a Radiologist on 10/7: He indicated that the findings in the right lower lobe look more like small atelectasis from the small pleural effusion rather than pneumonic consolidation which however cannot be completely excluded.  Also no central PE but this was not a PE protocol study.  Clinically improved.  Chest x-ray 10/8 showed small/improved right pleural effusion.  Transitioned to oral Augmentin at discharge to complete total 7 days course.  Outpatient follow-up with PCP/pulmonology.  Recommend repeating chest x-ray to follow-up in 4 weeks.  ESRD on TTS HD: With suspected volume overload.  Nephrology consulting.  Underwent HD 10/5 and 5 L fluid removed with some improvement in dyspnea.  On 9/30: Underwent left arm fistulogram, angioplasty of left arm AV fistula x3 and attempted plication of left arm AV fistula, ligation of left arm AV fistula and insertion of right IJ catheter.  Due to volume overload, underwent off schedule HD 10/7, back on his usual TTS schedule from 10/8.  Nephrology following.  Supposed to have HD 10/8 prior to discharge home.  I discussed with Nephrology who recommended continuing prior home dose of Lasix at discharge.  Acute on chronic diastolic CHF: TTE 04/30/2534: Severe LVH, LVEF 64-40%, grade 2 diastolic dysfunction.  Myocardium with speckled appearance.  Findings suggestive of amyloid.  Volume management across HD.  As per Nephrology, continue home dose of Lasix at discharge.  Hyperkalemia: Potassium 5.8 on 10/5.  Resolved after dialysis.  Acute respiratory failure with hypoxia: Secondary to pneumonia, pulmonary edema, pleural effusion and atelectasis.  Nephrology continues to manage volume across HD.  Met criteria for home oxygen, arranged by case management.  Essential hypertension: Mildly uncontrolled.  Volume management across HD.  Continue labetalol, losartan, clonidine and nifedipine.  Given polypharmacy, suspect difficult to control.  No change.  Anemia  in ESRD: Stable.  Failed renal  transplant: Continue prednisone, tacrolimus.  Restrictive cardiomyopathy with amyloidosis: Follows with outpatient Cardiology and Nephrology.    Mediastinal LN's/prevascular lymphadenopathy: As per radiology recommendations, follow-up CT chest in 3 months.   Consultants:  Nephrology  Procedures:  HD   Discharge Instructions  Discharge Instructions    (HEART FAILURE PATIENTS) Call MD:  Anytime you have any of the following symptoms: 1) 3 pound weight gain in 24 hours or 5 pounds in 1 week 2) shortness of breath, with or without a dry hacking cough 3) swelling in the hands, feet or stomach 4) if you have to sleep on extra pillows at night in order to breathe.   Complete by:  As directed    Call MD for:  difficulty breathing, headache or visual disturbances   Complete by:  As directed    Call MD for:  extreme fatigue   Complete by:  As directed    Call MD for:  persistant dizziness or light-headedness   Complete by:  As directed    Call MD for:  persistant nausea and vomiting   Complete by:  As directed    Call MD for:  severe uncontrolled pain   Complete by:  As directed    Call MD for:  temperature >100.4   Complete by:  As directed    Diet - low sodium heart healthy   Complete by:  As directed    Increase activity slowly   Complete by:  As directed        Medication List    STOP taking these medications   omeprazole 20 MG capsule Commonly known as:  PRILOSEC   RENAGEL 800 MG tablet Generic drug:  sevelamer     TAKE these medications   amoxicillin-clavulanate 500-125 MG tablet Commonly known as:  AUGMENTIN Take 1 tablet (500 mg total) by mouth daily for 3 days.   azelastine 0.1 % nasal spray Commonly known as:  ASTELIN Place 1 spray into both nostrils 2 (two) times daily. Use in each nostril as directed   chlorpheniramine 4 MG tablet Commonly known as:  CHLOR-TRIMETON Take 2 tablets (8 mg total) by mouth every 6 (six)  hours as needed for allergies.   cinacalcet 30 MG tablet Commonly known as:  SENSIPAR Take 30 mg by mouth daily.   cloNIDine 0.1 MG tablet Commonly known as:  CATAPRES Take 1 tablet (0.1 mg total) by mouth 2 (two) times daily.   dextromethorphan-guaiFENesin 10-100 MG/5ML liquid Commonly known as:  ROBITUSSIN-DM Take 10 mLs by mouth every 12 (twelve) hours as needed for cough.   fluticasone 50 MCG/ACT nasal spray Commonly known as:  FLONASE Place 2 sprays into both nostrils daily.   furosemide 80 MG tablet Commonly known as:  LASIX Take 1 tablet (80 mg total) by mouth 2 (two) times daily.   guaiFENesin 600 MG 12 hr tablet Commonly known as:  MUCINEX Take 2 tablets (1,200 mg total) by mouth 2 (two) times daily as needed for cough.   labetalol 300 MG tablet Commonly known as:  NORMODYNE Take 1 tablet (300 mg total) by mouth 2 (two) times daily.   lidocaine-prilocaine cream Commonly known as:  EMLA Apply 1 application topically once.   loratadine 10 MG tablet Commonly known as:  CLARITIN Take 10 mg by mouth daily as needed for allergies.   losartan 50 MG tablet Commonly known as:  COZAAR Take 1 tablet (50 mg total) by mouth daily.   multivitamin Tabs tablet Take 1 tablet by mouth daily.  NIFEdipine 60 MG 24 hr tablet Commonly known as:  PROCARDIA XL/ADALAT-CC Take 60 mg by mouth 2 (two) times daily.   oxyCODONE-acetaminophen 5-325 MG tablet Commonly known as:  PERCOCET/ROXICET Take 1 tablet by mouth every 6 (six) hours as needed for severe pain.   pravastatin 80 MG tablet Commonly known as:  PRAVACHOL Take 1 tablet (80 mg total) by mouth at bedtime.   predniSONE 5 MG tablet Commonly known as:  DELTASONE Take 5 mg by mouth daily with breakfast.   sevelamer carbonate 800 MG tablet Commonly known as:  RENVELA Take 1 tablet (800 mg total) by mouth 3 (three) times daily with meals.   tacrolimus 5 MG capsule Commonly known as:  PROGRAF Take 5 mg by mouth 2  (two) times daily.      Follow-up Information    Orma Flaming, MD. Schedule an appointment as soon as possible for a visit in 1 week(s).   Specialty:  Family Medicine Contact information: [place] [address] [place] Follow up on 12/31/2017.   Why:  Continue regular hemodialysis on Tuesdays, Thursdays and Saturdays.       Marshell Garfinkel, MD Follow up on 12/30/2017.   Specialty:  Pulmonary Disease Why:  11 AM.  Reschedule if you are unable to keep up this appointment. Contact information: [address] [place] [address] [phone]          Allergies  Allergen Reactions  . Azithromycin Nausea And Vomiting and Other (See Comments)    Chest tightness      Procedures/Studies: Dg Chest 2 View  Result Date: 12/29/2017 CLINICAL DATA:  Pneumonia follow-up EXAM: CHEST - 2 VIEW COMPARISON:  Three days ago FINDINGS: Small right pleural effusion with atelectasis and/or pneumonia. Pleural fluid has decreased. Stable cardiopericardial enlargement. Stable dialysis catheter positioning. IMPRESSION: Small and decreased pleural effusion on the right. Known right lower lobe atelectasis or pneumonia. Electronically Signed   By: Monte Fantasia M.D.   On: 12/29/2017 15:18   Dg Chest 2 View  Result Date: 12/26/2017 CLINICAL DATA:  Shortness of Breath EXAM: CHEST - 2 VIEW COMPARISON:  12/25/2017 FINDINGS: Right dialysis catheter is unchanged. Cardiomegaly with vascular congestion. Right lower lobe consolidation with moderate right effusion is similar to prior study. Cannot exclude pneumonia. IMPRESSION: Cardiomegaly, vascular congestion, stable. Continued right lower lobe consolidation with right effusion. Cannot exclude pneumonia. Electronically Signed   By: Rolm Baptise M.D.   On: 12/26/2017 07:48   Dg Chest 2 View  Result Date: 12/25/2017 CLINICAL DATA:  Chest pain, dialysis patient, low-grade fever EXAM: CHEST - 2  VIEW COMPARISON:  12/21/2017 FINDINGS: Cardiomegaly with central vascular congestion. New small right pleural effusion with right basilar collapse/consolidation. Difficult to exclude right basilar pneumonia. No pneumothorax. Trachea is midline. Right IJ dialysis catheter tips SVC RA junction. IMPRESSION: New small right pleural effusion with right basilar collapse/consolidation. Pneumonia not excluded. Chronic cardiomegaly and vascular congestion Electronically Signed   By: Jerilynn Mages.  Shick M.D.   On: 12/25/2017 16:45   Ct Chest W Contrast  Result Date: 12/25/2017 CLINICAL DATA:  Cough and shortness of breath. EXAM: CT CHEST WITH CONTRAST TECHNIQUE: Multidetector CT imaging of the chest was performed during intravenous contrast administration. CONTRAST:  43m OMNIPAQUE IOHEXOL 300 MG/ML  SOLN COMPARISON:  None. FINDINGS: Cardiovascular: Heart is enlarged. Tiny pericardial effusion evident. Right IJ central line tip is positioned in the upper to mid right atrium. Atherosclerotic calcification  is noted in the wall of the thoracic aorta. Mediastinum/Nodes: Numerous tiny lymph nodes are seen in the mediastinum. 13 mm short axis prevascular node (67/4) is mildly enlarged. There is no hilar lymphadenopathy. The esophagus has normal imaging features. Increased number of lymph nodes are seen in the axillary regions and subpectoral spaces bilaterally. Lungs/Pleura: The central tracheobronchial airways are patent. There is bibasilar dependent lower lobe collapse/consolidation, right greater than left. Small right pleural effusion associated. No overtly suspicious nodule or mass. Upper Abdomen: Unremarkable. Musculoskeletal: No worrisome lytic or sclerotic osseous abnormality. Bilateral gynecomastia. IMPRESSION: 1. Right lower lobe collapse/consolidation with small right pleural effusion. 2. Minimal atelectasis posterior left lower lobe. 3. Increased number of nonenlarged lymph nodes in the mediastinum and axillary regions,  nonspecific but potentially reactive. There is a single prevascular lymph node which is borderline to mildly enlarged at 13 mm short axis. Follow-up CT chest in 3 months could be used to ensure stability. 4. Cardiomegaly. 5.  Aortic Atherosclerois (ICD10-170.0) Electronically Signed   By: Misty Stanley M.D.   On: 12/25/2017 19:36   Dg Chest Port 1 View  Result Date: 12/21/2017 CLINICAL DATA:  Dialysis catheter insertion EXAM: PORTABLE CHEST 1 VIEW COMPARISON:  10/19/2017 FINDINGS: Dual lumen catheter inserted from a right internal jugular approach. Tips are in the right atrium. Chronically enlarged cardiac silhouette. Possible venous hypertension without frank edema. No effusions. No focal pulmonary finding. No pneumothorax. IMPRESSION: Central line tips in the right atrium.  No pneumothorax. Cardiomegaly.  Possible venous hypertension. Electronically Signed   By: Nelson Chimes M.D.   On: 12/21/2017 12:43      Subjective: Overall continues to feel much better.  Right-sided pleuritic chest pain has significantly improved, down to 1-2/10 intermittently only upon taking deep breaths when he tries to move around.  Minimal DOE at times.  No cough, fever or chills.  Discharge Exam:  Vitals:   12/28/17 2127 12/29/17 0656 12/29/17 0821 12/29/17 1414  BP: (!) 154/89 (!) 125/111 135/82 131/76  Pulse: 97 85  81  Resp:  19  18  Temp:  98.2 F (36.8 C)  98.4 F (36.9 C)  TempSrc:  Oral  Oral  SpO2:  90% 98% 95%  Weight:  74.4 kg    Height:        General exam: Pleasant young male, moderately built and nourished, sitting up comfortably in bed Respiratory system: Slightly diminished breath sounds in the right base, occasional right basal crackles, no pleural rub.  Rest of lung fields clear to auscultation.  Right IJ HD catheter. Cardiovascular system: S1 & S2 heard, RRR. No JVD, murmurs, rubs, gallops or clicks. No pedal edema.   Gastrointestinal system: Abdomen is nondistended, soft and nontender. No  organomegaly or masses felt. Normal bowel sounds heard.   Central nervous system: Alert and oriented. No focal neurological deficits.   Extremities: Symmetric 5 x 5 power.  Left upper arm AV fistula. Skin: No rashes, lesions or ulcers Psychiatry: Judgement and insight appear normal. Mood & affect appropriate.     The results of significant diagnostics from this hospitalization (including imaging, microbiology, ancillary and laboratory) are listed below for reference.     Microbiology: Recent Results (from the past 240 hour(s))  MRSA PCR Screening     Status: None   Collection Time: 12/25/17 11:50 PM  Result Value Ref [place] Status   MRSA by PCR NEGATIVE NEGATIVE Final    Comment:        The GeneXpert MRSA Assay (FDA  approved for NASAL specimens only), is one component of a comprehensive MRSA colonization surveillance program. It is not intended to diagnose MRSA infection nor to guide or monitor treatment for MRSA infections. Performed at [facility], [place] [address], [place], [place] [address]      Labs: CBC: Recent Labs  Lab 12/25/17 1623 12/26/17 0646 12/28/17 0737 12/29/17 1048  WBC 10.3 11.9* 10.5 10.2  NEUTROABS 8.4*  --   --   --   HGB 10.4* 9.7* 10.0* 10.3*  HCT 33.7* 33.0* 32.9* 33.8*  MCV 74.4* 75.2* 73.1* 72.7*  PLT 273 229 243 563   Basic Metabolic Panel: Recent Labs  Lab 12/25/17 1623 12/26/17 0646 12/28/17 0737 12/29/17 1048  NA 137 133* 127* 130*  K 4.7 5.8* 4.8 4.9  CL 95* 94* 91* 89*  CO2 25 21* 21* 23  GLUCOSE 113* 101* 118* 109*  BUN 48* 56* 71* 58*  CREATININE 9.34* 10.52* 10.42* 8.36*  CALCIUM 8.6* 8.2* 7.7* 8.9  PHOS  --   --  6.8* 7.1*   Liver Function Tests: Recent Labs  Lab 12/28/17 0737 12/29/17 1048  ALBUMIN 2.6* 2.5*      Time coordinating discharge: 40 minutes  SIGNED:  Vernell Leep, MD, FACP, [facility]. [facility] Pager [phone]  If 7PM-7AM, please contact night-coverage [email or web address] Password  [facility] 12/29/2017, 6:46 PM

## 2017-12-29 NOTE — Progress Notes (Signed)
Patient is for discharge home today with home oxygen; Caryl Pina with [employer] called for DME/ Oxygen; a small portable tank will be delivered to the room today prior to discharging home and more oxygen tanks will be delivered to the home after discharge; B Pennie Rushing [phone]

## 2017-12-29 NOTE — Progress Notes (Signed)
SATURATION QUALIFICATIONS: (This note is used to comply with regulatory documentation for home oxygen)  Patient Saturations on Room Air at Rest = 92%  Patient Saturations on Room Air while Ambulating = 87%  Patient Saturations on 2Liters of oxygen while Ambulating = 92%  Please briefly explain why patient needs home oxygen:  

## 2017-12-29 NOTE — Discharge Instructions (Signed)

## 2017-12-29 NOTE — Progress Notes (Addendum)
[name] Progress Note   Subjective:   Seen and examined at bedside.  States CP has improved but continues to have SOB, especially with exertion.  Able to tolerate resting in bed without O2.  Denies n/v/d.   Objective Vitals:   12/28/17 1927 12/28/17 2127 12/29/17 0656 12/29/17 0821  BP: (!) 141/80 (!) 154/89 (!) 125/111 135/82  Pulse: 92 97 85   Resp: 20  19   Temp: 98.7 F (37.1 C)  98.2 F (36.8 C)   TempSrc: Oral  Oral   SpO2: (!) 89%  90% 98%  Weight:   74.4 kg   Height:       Physical Exam General:NAD, WDWN male Heart:RRR, no mrg Lungs:breath sounds diminished bilaterally, +egophony in R base Abdomen:soft, NTND Extremities:no LE edema Dialysis Access: R IJ TDC, recent unsuccessful plication of LU AVF +thrill   Filed Weights   12/28/17 0720 12/28/17 1045 12/29/17 0656  Weight: 77.5 kg 71.7 kg 74.4 kg    Intake/Output Summary (Last 24 hours) at 12/29/2017 1248 Last data filed at 12/28/2017 1800 Gross per 24 hour  Intake 380 ml  Output 0 ml  Net 380 ml    Additional Objective Labs: Basic Metabolic Panel: Recent Labs  Lab 12/26/17 0646 12/28/17 0737 12/29/17 1048  NA 133* 127* 130*  K 5.8* 4.8 4.9  CL 94* 91* 89*  CO2 21* 21* 23  GLUCOSE 101* 118* 109*  BUN 56* 71* 58*  CREATININE 10.52* 10.42* 8.36*  CALCIUM 8.2* 7.7* 8.9  PHOS  --  6.8* 7.1*   Liver Function Tests: Recent Labs  Lab 12/28/17 0737 12/29/17 1048  ALBUMIN 2.6* 2.5*   No results for input(s): LIPASE, AMYLASE in the last 168 hours. CBC: Recent Labs  Lab 12/25/17 1623 12/26/17 0646 12/28/17 0737 12/29/17 1048  WBC 10.3 11.9* 10.5 10.2  NEUTROABS 8.4*  --   --   --   HGB 10.4* 9.7* 10.0* 10.3*  HCT 33.7* 33.0* 32.9* 33.8*  MCV 74.4* 75.2* 73.1* 72.7*  PLT 273 229 243 278   Blood Culture    Component Value Date/Time   SDES FLUID RIGHT PLEURAL 10/19/2017 1059   SPECREQUEST NO BACTERIA CULTURE ORDERED,PER DR Eliseo Squires 10/19/2017 1059   CULT NO GROWTH  10/07/2016 0907   REPTSTATUS 10/19/2017 FINAL 10/19/2017 1059    Cardiac Enzymes: No results for input(s): CKTOTAL, CKMB, CKMBINDEX, TROPONINI in the last 168 hours. CBG: No results for input(s): GLUCAP in the last 168 hours. Iron Studies: No results for input(s): IRON, TIBC, TRANSFERRIN, FERRITIN in the last 72 hours. Lab Results  Component Value Date   INR 1.13 09/23/2017   INR 1.01 08/28/2016   Studies/Results: No results found.  Medications: . sodium chloride    . sodium chloride    . sodium chloride Stopped (12/28/17 1758)  . ceFEPime (MAXIPIME) IV 2 g (12/26/17 2001)  . vancomycin 750 mg (12/26/17 1612)   . Chlorhexidine Gluconate Cloth  6 each Topical Q0600  . Chlorhexidine Gluconate Cloth  6 each Topical Q0600  . cinacalcet  30 mg Oral Q breakfast  . cloNIDine  0.1 mg Oral BID  . doxercalciferol  1 mcg Intravenous Q T,Th,Sa-HD  . fluticasone  2 spray Each Nare Daily  . labetalol  300 mg Oral BID  . losartan  50 mg Oral Daily  . multivitamin  1 tablet Oral Daily  . NIFEdipine  60 mg Oral BID  . pravastatin  80 mg Oral QHS  . predniSONE  5 mg  Oral Q breakfast  . sevelamer carbonate  800 mg Oral TID WC  . tacrolimus  5 mg Oral BID    Dialysis Orders: Dialysis:[facility] TTS 4h 72.5kg R IJ TDC 2/2 bath Hep none - hect 1 ug tiw - mircera 225 q 2, last 9/26  Assessment/Plan: 1. CP/SOB 2/2 RLL PNA and possibly some fluid overload - CP improving. Remains SOB, desat to 87% while ambulating on RA. on Vanc/cefepime.  Should meet EDW with HD today.  2. ESRD -  K 4.9. HD today per regular schedule. Net UF goal 1-2L, should meet EDW.  3. Anemia of CKD- Hgb 10.3 - no indication for ESA at this time.  4. Secondary hyperparathyroidism - Ca in goal. Phos elevated. Illene Regulus to 2AC TID. Continue sensipar and VDRA.  5. HTN/volume - BP elevated overnight but improved this AM. On clonidine, labetalol, losartan, and nifedipine.  6. Nutrition - Alb 2.5. Renal diet with fluid  restrictions, prostat, renavite.  7. Hx failed renal transplant - on Prograf  Jen Mow, PA-C [facility] Pager: [phone] 12/29/2017,12:48 PM  LOS: 3 days   Pt seen, examined and agree w A/P as above. Ready for dc today, after HD.   Kelly Splinter MD [employer] pager [phone]   12/29/2017, 1:13 PM

## 2017-12-29 NOTE — Plan of Care (Signed)
  Problem: Education: Goal: Knowledge of General Education information will improve Description Including pain rating scale, medication(s)/side effects and non-pharmacologic comfort measures Outcome: Progressing   Problem: Health Behavior/Discharge Planning: Goal: Ability to manage health-related needs will improve Outcome: Progressing   Problem: Clinical Measurements: Goal: Ability to maintain clinical measurements within normal limits will improve Outcome: Progressing   Problem: Clinical Measurements: Goal: Will remain free from infection Outcome: Progressing   Problem: Clinical Measurements: Goal: Respiratory complications will improve Outcome: Progressing   Problem: Clinical Measurements: Goal: Cardiovascular complication will be avoided Outcome: Progressing   Problem: Nutrition: Goal: Adequate nutrition will be maintained Outcome: Progressing   Problem: Pain Managment: Goal: General experience of comfort will improve Outcome: Progressing

## 2017-12-30 ENCOUNTER — Ambulatory Visit: Payer: Medicare Other | Admitting: Pulmonary Disease

## 2017-12-30 DIAGNOSIS — D638 Anemia in other chronic diseases classified elsewhere: Secondary | ICD-10-CM

## 2017-12-30 LAB — RENAL FUNCTION PANEL
ALBUMIN: 2.4 g/dL — AB (ref 3.5–5.0)
ANION GAP: 19 — AB (ref 5–15)
BUN: 73 mg/dL — ABNORMAL HIGH (ref 6–20)
CALCIUM: 8.1 mg/dL — AB (ref 8.9–10.3)
CO2: 21 mmol/L — ABNORMAL LOW (ref 22–32)
CREATININE: 9.68 mg/dL — AB (ref 0.61–1.24)
Chloride: 87 mmol/L — ABNORMAL LOW (ref 98–111)
GFR, EST AFRICAN AMERICAN: 6 mL/min — AB (ref >60)
GFR, EST NON AFRICAN AMERICAN: 5 mL/min — AB (ref >60)
Glucose, Bld: 118 mg/dL — ABNORMAL HIGH (ref 70–99)
PHOSPHORUS: 7.6 mg/dL — AB (ref 2.5–4.6)
Potassium: 5.2 mmol/L — ABNORMAL HIGH (ref 3.5–5.1)
SODIUM: 127 mmol/L — AB (ref 135–145)

## 2017-12-30 LAB — CBC
HCT: 34.2 % — ABNORMAL LOW (ref 39.0–52.0)
HEMOGLOBIN: 10.3 g/dL — AB (ref 13.0–17.0)
MCH: 21.7 pg — ABNORMAL LOW (ref 26.0–34.0)
MCHC: 30.1 g/dL (ref 30.0–36.0)
MCV: 72.2 fL — ABNORMAL LOW (ref 80.0–100.0)
NRBC: 0 % (ref 0.0–0.2)
Platelets: 341 10*3/uL (ref 150–400)
RBC: 4.74 MIL/uL (ref 4.22–5.81)
RDW: 22.6 % — ABNORMAL HIGH (ref 11.5–15.5)
WBC: 10 10*3/uL (ref 4.0–10.5)

## 2017-12-30 MED ORDER — AMOXICILLIN-POT CLAVULANATE 500-125 MG PO TABS: 1.0000 | ORAL_TABLET | Freq: Every day | ORAL | 0 refills | Status: AC

## 2017-12-30 MED ORDER — DOXERCALCIFEROL 4 MCG/2ML IV SOLN
INTRAVENOUS | Status: AC
  Administered 2017-12-30: 1 ug via INTRAVENOUS
  Filled 2017-12-30: qty 2

## 2017-12-30 MED ORDER — HEPARIN SODIUM (PORCINE) 1000 UNIT/ML IJ SOLN
INTRAMUSCULAR | Status: AC
  Administered 2017-12-30: 1000 [IU] via INTRAVENOUS_CENTRAL
  Filled 2017-12-30: qty 4

## 2017-12-30 NOTE — Progress Notes (Signed)
[facility] Progress Note   Subjective:   Feeling much better. Had HD late last night, early this am.  For dc today  Objective Vitals:   12/30/17 0327 12/30/17 0353 12/30/17 0356 12/30/17 1003  BP: (!) 167/101  (!) 162/94 (!) 180/100  Pulse: 89  93 95  Resp: 17  18   Temp: 98 F (36.7 C)  98 F (36.7 C)   TempSrc: Oral  Oral   SpO2: 97%  96% 94%  Weight: 73.8 kg 73.7 kg    Height:       Physical Exam General:NAD, WDWN male Heart:RRR, no mrg Lungs:breath sounds diminished bilaterally, +egophony in R base Abdomen:soft, NTND Extremities:no LE edema Dialysis Access: R IJ TDC, recent unsuccessful plication of LU AVF +thrill   Filed Weights   12/30/17 0010 12/30/17 0327 12/30/17 0353  Weight: 75.8 kg 73.8 kg 73.7 kg    Intake/Output Summary (Last 24 hours) at 12/30/2017 1126 Last data filed at 12/30/2017 1020 Gross per 24 hour  Intake 854.47 ml  Output 2000 ml  Net -1145.53 ml    Additional Objective Labs: Basic Metabolic Panel: Recent Labs  Lab 12/28/17 0737 12/29/17 1048 12/30/17 0034  NA 127* 130* 127*  K 4.8 4.9 5.2*  CL 91* 89* 87*  CO2 21* 23 21*  GLUCOSE 118* 109* 118*  BUN 71* 58* 73*  CREATININE 10.42* 8.36* 9.68*  CALCIUM 7.7* 8.9 8.1*  PHOS 6.8* 7.1* 7.6*   Liver Function Tests: Recent Labs  Lab 12/28/17 0737 12/29/17 1048 12/30/17 0034  ALBUMIN 2.6* 2.5* 2.4*   No results for input(s): LIPASE, AMYLASE in the last 168 hours. CBC: Recent Labs  Lab 12/25/17 1623 12/26/17 0646 12/28/17 0737 12/29/17 1048 12/30/17 0034  WBC 10.3 11.9* 10.5 10.2 10.0  NEUTROABS 8.4*  --   --   --   --   HGB 10.4* 9.7* 10.0* 10.3* 10.3*  HCT 33.7* 33.0* 32.9* 33.8* 34.2*  MCV 74.4* 75.2* 73.1* 72.7* 72.2*  PLT 273 229 243 278 341   Blood Culture    Component Value Date/Time   SDES FLUID RIGHT PLEURAL 10/19/2017 1059   SPECREQUEST NO BACTERIA CULTURE ORDERED,PER DR Eliseo Squires 10/19/2017 1059   CULT NO GROWTH 10/07/2016 0907   REPTSTATUS  10/19/2017 FINAL 10/19/2017 1059    Cardiac Enzymes: No results for input(s): CKTOTAL, CKMB, CKMBINDEX, TROPONINI in the last 168 hours. CBG: No results for input(s): GLUCAP in the last 168 hours. Iron Studies: No results for input(s): IRON, TIBC, TRANSFERRIN, FERRITIN in the last 72 hours. Lab Results  Component Value Date   INR 1.13 09/23/2017   INR 1.01 08/28/2016   Studies/Results: Dg Chest 2 View  Result Date: 12/29/2017 CLINICAL DATA:  Pneumonia follow-up EXAM: CHEST - 2 VIEW COMPARISON:  Three days ago FINDINGS: Small right pleural effusion with atelectasis and/or pneumonia. Pleural fluid has decreased. Stable cardiopericardial enlargement. Stable dialysis catheter positioning. IMPRESSION: Small and decreased pleural effusion on the right. Known right lower lobe atelectasis or pneumonia. Electronically Signed   By: Monte Fantasia M.D.   On: 12/29/2017 15:18    Medications: . sodium chloride    . sodium chloride    . sodium chloride 10 mL/hr at 12/30/17 0400  . ceFEPime (MAXIPIME) IV 2 g (12/30/17 0400)  . vancomycin 750 mg (12/30/17 0220)   . Chlorhexidine Gluconate Cloth  6 each Topical Q0600  . Chlorhexidine Gluconate Cloth  6 each Topical Q0600  . cinacalcet  30 mg Oral Q breakfast  . cloNIDine  0.1 mg Oral BID  . doxercalciferol  1 mcg Intravenous Q T,Th,Sa-HD  . fluticasone  2 spray Each Nare Daily  . labetalol  300 mg Oral BID  . losartan  50 mg Oral Daily  . multivitamin  1 tablet Oral Daily  . NIFEdipine  60 mg Oral BID  . pravastatin  80 mg Oral QHS  . predniSONE  5 mg Oral Q breakfast  . sevelamer carbonate  800 mg Oral TID WC  . tacrolimus  5 mg Oral BID    Dialysis Orders: Dialysis:[facility] TTS 4h 72.5kg R IJ TDC 2/2 bath Hep none - hect 1 ug tiw - mircera 225 q 2, last 9/26  Assessment/Plan: 1. RLL PNA - for dc today, switching to augmentin po for 3 more days. 2. ESRD -  TTS HD.   3. Anemia of CKD- Hgb 10.3 - no indication for ESA at this  time.  4. Secondary hyperparathyroidism - Ca in goal. Phos elevated. Illene Regulus to 2AC TID. Continue sensipar and VDRA.  5. HTN/volume - cont clonidine, labetalol, losartan, and nifedipine.  6. Nutrition - Alb 2.5. Renal diet with fluid restrictions, prostat, renavite.  7. 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 Dispo - for dc today   Kelly Splinter MD [employer] pager [phone]   12/30/2017, 11:26 AM

## 2017-12-30 NOTE — Progress Notes (Signed)
HD tx completed @ 0320 w/o problem, UF goal met, blood rinsed back, VSS, report called to Hermina Barters, RN

## 2017-12-30 NOTE — Progress Notes (Signed)
[facility]                                                                              Patient Demographics  Ryan [name], is a 57 y.o. male, DOB - 11-18-1960, JOA:[id number]  Admit date - 12/25/2017   Admitting Physician Rise Patience, MD  Outpatient Primary MD for the patient is Orma Flaming, MD  Outpatient specialists:   LOS - 4  days   Medical records reviewed and are as summarized below:    Chief Complaint  Patient presents with  . Cough  . 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 CT chest showed right lower lobe collapse/consolidation and small pleural effusion, mediastinal lymphadenopathy felt to be reactive and admitted for suspected HCAP   Assessment & Plan    Principal Problem:   Acute respiratory failure with hypoxia ([place]) secondary to lobar pneumonia/RLL HCAP/pleurisy -Continue oral Augmentin for 3 more days to complete full course -Clinically improved, vital signs stable, afebrile, outpatient follow-up with pulmonology/PCP with repeat chest x-ray to follow-up in 4 weeks. -O2 arranged by the case management  Active Problems:   Hypertension -BP mildly uncontrolled, volume management with HD    Anemia of chronic disease H&H currently stable    ESRD on dialysis ([place]) TTS -Nephrology was consulted, patient underwent hemodialysis -Patient underwent left arm fistulogram, angioplasty of left arm AV fistula, ligation of  left arm AV fistula, insertion of right IJ catheter -Continue hemodialysis, received HD 10/8 -DC summary done by Dr. 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 2D echo 09/2017 showed severe LVH, EF 55 to 60%, grade 2 diastolic dysfunction, myocardium with speckled appearance  Mediastinal lymphadenopathy Follow-up chest CT in 3 months  Failed renal transplant Continue prednisone, tacrolimus  Code Status: Full code DVT Prophylaxis:   Family Communication: Discussed in detail with the patient, all imaging results, lab results explained to the patient    Disposition Plan: DC home today  Time Spent in minutes   25  Procedures:  Hemodialysis  Consultants:   Nephrology  Antimicrobials:      Medications  Scheduled Meds: . Chlorhexidine Gluconate Cloth  6 each Topical Q0600  . Chlorhexidine Gluconate Cloth  6 each Topical Q0600  . cinacalcet  30 mg Oral Q breakfast  . cloNIDine  0.1 mg Oral BID  . doxercalciferol  1 mcg Intravenous Q T,Th,Sa-HD  . fluticasone  2 spray Each Nare Daily  . labetalol  300 mg Oral BID  . losartan  50  mg Oral Daily  . multivitamin  1 tablet Oral Daily  . NIFEdipine  60 mg Oral BID  . pravastatin  80 mg Oral QHS  . predniSONE  5 mg Oral Q breakfast  . sevelamer carbonate  800 mg Oral TID WC  . tacrolimus  5 mg Oral BID   Continuous Infusions: . sodium chloride    . sodium chloride    . sodium chloride 10 mL/hr at 12/30/17 0400  . ceFEPime (MAXIPIME) IV 2 g (12/30/17 0400)  . vancomycin 750 mg (12/30/17 0220)   PRN Meds:.sodium chloride, sodium chloride, sodium chloride, acetaminophen **OR** acetaminophen, alteplase, dextromethorphan-guaiFENesin, guaiFENesin, heparin, HYDROmorphone (DILAUDID) injection,  lidocaine (PF), lidocaine-prilocaine, loratadine, ondansetron **OR** ondansetron (ZOFRAN) IV, oxyCODONE-acetaminophen, pentafluoroprop-tetrafluoroeth   Antibiotics   Anti-infectives (From admission, onward)   Start     Dose/Rate Route Frequency Ordered Stop   12/30/17 0300  ceFEPIme (MAXIPIME) 2 g in sodium chloride 0.9 % 100 mL IVPB     2 g 200 mL/hr over 30 Minutes Intravenous Every T-Th-Sa (1800) 12/29/17 2327     12/30/17 0000  amoxicillin-clavulanate (AUGMENTIN) 500-125 MG tablet     1 tablet Oral Daily 12/30/17 0927 01/02/18 2359   12/29/17 0000  amoxicillin-clavulanate (AUGMENTIN) 500-125 MG tablet  Status:  Discontinued     1 tablet Oral Daily 12/29/17 1655 12/30/17    12/28/17 1600  ceFEPIme (MAXIPIME) 2 g in sodium chloride 0.9 % 100 mL IVPB     2 g 200 mL/hr over 30 Minutes Intravenous  Once 12/28/17 1359 12/28/17 1756   12/28/17 1500  vancomycin (VANCOCIN) IVPB 750 mg/150 ml premix     750 mg 150 mL/hr over 60 Minutes Intravenous  Once 12/28/17 1359 12/28/17 1652   12/26/17 2000  ceFEPIme (MAXIPIME) 2 g in sodium chloride 0.9 % 100 mL IVPB  Status:  Discontinued     2 g 200 mL/hr over 30 Minutes Intravenous Every T-Th-Sa (Hemodialysis) 12/26/17 1549 12/29/17 2327   12/26/17 1611  Vancomycin (VANCOCIN) 750-5 MG/150ML-% IVPB    Note to Pharmacy:  Anthonette Legato, Melisa   : cabinet override      12/26/17 1611 12/27/17 0414   12/26/17 1600  ceFEPIme (MAXIPIME) 2 g in sodium chloride 0.9 % 100 mL IVPB  Status:  Discontinued     2 g 200 mL/hr over 30 Minutes Intravenous Every T-Th-Sa (Hemodialysis) 12/26/17 1548 12/26/17 1549   12/26/17 1600  vancomycin (VANCOCIN) IVPB 750 mg/150 ml premix     750 mg 150 mL/hr over 60 Minutes Intravenous Every T-Th-Sa (Hemodialysis) 12/26/17 1548     12/25/17 2250  vancomycin variable dose per unstable renal function (pharmacist dosing)  Status:  Discontinued      Does not apply See admin instructions 12/25/17 2250 12/28/17 1349   12/25/17 2230   ceFEPIme (MAXIPIME) 1 g in sodium chloride 0.9 % 100 mL IVPB     1 g 200 mL/hr over 30 Minutes Intravenous  Once 12/25/17 2221 12/25/17 2354   12/25/17 2230  vancomycin (VANCOCIN) 2,000 mg in sodium chloride 0.9 % 500 mL IVPB     2,000 mg 250 mL/hr over 120 Minutes Intravenous  Once 12/25/17 2225 12/26/17 0219        Subjective:   Ryan [name] was seen and examined today.  No complaints, completed hemodialysis at 4 AM this morning, feels ready to go home today.  Patient denies dizziness, shortness of breath, abdominal pain, N/V/D/C, new weakness, numbess, tingling. No acute events overnight.    Objective:  Vitals:   12/30/17 0327 12/30/17 0353 12/30/17 0356 12/30/17 1003  BP: (!) 167/101  (!) 162/94 (!) 180/100  Pulse: 89  93 95  Resp: 17  18   Temp: 98 F (36.7 C)  98 F (36.7 C)   TempSrc: Oral  Oral   SpO2: 97%  96% 94%  Weight: 73.8 kg 73.7 kg    Height:        Intake/Output Summary (Last 24 hours) at 12/30/2017 1033 Last data filed at 12/30/2017 1020 Gross per 24 hour  Intake 854.47 ml  Output 2000 ml  Net -1145.53 ml     Wt Readings from Last 3 Encounters:  12/30/17 73.7 kg  12/14/17 80.3 kg  10/30/17 75.3 kg     Exam  General: Alert and oriented x 3, NAD  Eyes: PERRLA, EOMI, Anicteric Sclera,  HEENT:  Atraumatic, normocephalic, normal oropharynx  Cardiovascular: S1 S2 auscultated, no rubs, murmurs or gallops. Regular rate and rhythm.  Respiratory: Clear to auscultation bilaterally, no wheezing, rales or rhonchi  Gastrointestinal: Soft, nontender, nondistended, + bowel sounds  Ext: no pedal edema bilaterally  Neuro: AAOx3, Cr N's II- XII. Strength 5/5 upper and lower extremities bilaterally, speech clear, sensations grossly intact  Musculoskeletal: No digital cyanosis, clubbing  Skin: No rashes  Psych: Normal affect and demeanor, alert and oriented x3    Data Reviewed:  I have personally reviewed following labs and imaging  studies  Micro Results Recent Results (from the past 240 hour(s))  MRSA PCR Screening     Status: None   Collection Time: 12/25/17 11:50 PM  Result Value Ref Range Status   MRSA by PCR NEGATIVE NEGATIVE Final    Comment:        The GeneXpert MRSA Assay (FDA approved for NASAL specimens only), is one component of a comprehensive MRSA colonization surveillance program. It is not intended to diagnose MRSA infection nor to guide or monitor treatment for MRSA infections. Performed at [facility], [place] [address] [place], [place] [address]     Radiology Reports Dg Chest 2 View  Result Date: 12/29/2017 CLINICAL DATA:  Pneumonia follow-up EXAM: CHEST - 2 VIEW COMPARISON:  Three days ago FINDINGS: Small right pleural effusion with atelectasis and/or pneumonia. Pleural fluid has decreased. Stable cardiopericardial enlargement. Stable dialysis catheter positioning. IMPRESSION: Small and decreased pleural effusion on the right. Known right lower lobe atelectasis or pneumonia. Electronically Signed   By: Monte Fantasia M.D.   On: 12/29/2017 15:18   Dg Chest 2 View  Result Date: 12/26/2017 CLINICAL DATA:  Shortness of Breath EXAM: CHEST - 2 VIEW COMPARISON:  12/25/2017 FINDINGS: Right dialysis catheter is unchanged. Cardiomegaly with vascular congestion. Right lower lobe consolidation with moderate right effusion is similar to prior study. Cannot exclude pneumonia. IMPRESSION: Cardiomegaly, vascular congestion, stable. Continued right lower lobe consolidation with right effusion. Cannot exclude pneumonia. Electronically Signed   By: Rolm Baptise M.D.   On: 12/26/2017 07:48   Dg Chest 2 View  Result Date: 12/25/2017 CLINICAL DATA:  Chest pain, dialysis patient, low-grade fever EXAM: CHEST - 2 VIEW COMPARISON:  12/21/2017 FINDINGS: Cardiomegaly with central vascular congestion. New small right pleural effusion with right basilar collapse/consolidation. Difficult to exclude right basilar  pneumonia. No pneumothorax. Trachea is midline. Right IJ dialysis catheter tips SVC RA junction. IMPRESSION: New small right pleural effusion with right basilar collapse/consolidation. Pneumonia not excluded. Chronic cardiomegaly and vascular congestion Electronically Signed   By: Jerilynn Mages.  Shick M.D.   On: 12/25/2017 16:45  Ct Chest W Contrast  Result Date: 12/25/2017 CLINICAL DATA:  Cough and shortness of breath. EXAM: CT CHEST WITH CONTRAST TECHNIQUE: Multidetector CT imaging of the chest was performed during intravenous contrast administration. CONTRAST:  5mL OMNIPAQUE IOHEXOL 300 MG/ML  SOLN COMPARISON:  None. FINDINGS: Cardiovascular: Heart is enlarged. Tiny pericardial effusion evident. Right IJ central line tip is positioned in the upper to mid right atrium. Atherosclerotic calcification is noted in the wall of the thoracic aorta. Mediastinum/Nodes: Numerous tiny lymph nodes are seen in the mediastinum. 13 mm short axis prevascular node (67/4) is mildly enlarged. There is no hilar lymphadenopathy. The esophagus has normal imaging features. Increased number of lymph nodes are seen in the axillary regions and subpectoral spaces bilaterally. Lungs/Pleura: The central tracheobronchial airways are patent. There is bibasilar dependent lower lobe collapse/consolidation, right greater than left. Small right pleural effusion associated. No overtly suspicious nodule or mass. Upper Abdomen: Unremarkable. Musculoskeletal: No worrisome lytic or sclerotic osseous abnormality. Bilateral gynecomastia. IMPRESSION: 1. Right lower lobe collapse/consolidation with small right pleural effusion. 2. Minimal atelectasis posterior left lower lobe. 3. Increased number of nonenlarged lymph nodes in the mediastinum and axillary regions, nonspecific but potentially reactive. There is a single prevascular lymph node which is borderline to mildly enlarged at 13 mm short axis. Follow-up CT chest in 3 months could be used to ensure  stability. 4. Cardiomegaly. 5.  Aortic Atherosclerois (ICD10-170.0) Electronically Signed   By: Misty Stanley M.D.   On: 12/25/2017 19:36   Dg Chest Port 1 View  Result Date: 12/21/2017 CLINICAL DATA:  Dialysis catheter insertion EXAM: PORTABLE CHEST 1 VIEW COMPARISON:  10/19/2017 FINDINGS: Dual lumen catheter inserted from a right internal jugular approach. Tips are in the right atrium. Chronically enlarged cardiac silhouette. Possible venous hypertension without frank edema. No effusions. No focal pulmonary finding. No pneumothorax. IMPRESSION: Central line tips in the right atrium.  No pneumothorax. Cardiomegaly.  Possible venous hypertension. Electronically Signed   By: Nelson Chimes M.D.   On: 12/21/2017 12:43    Lab Data:  CBC: Recent Labs  Lab 12/25/17 1623 12/26/17 0646 12/28/17 0737 12/29/17 1048 12/30/17 0034  WBC 10.3 11.9* 10.5 10.2 10.0  NEUTROABS 8.4*  --   --   --   --   HGB 10.4* 9.7* 10.0* 10.3* 10.3*  HCT 33.7* 33.0* 32.9* 33.8* 34.2*  MCV 74.4* 75.2* 73.1* 72.7* 72.2*  PLT 273 229 243 278 161   Basic Metabolic Panel: Recent Labs  Lab 12/25/17 1623 12/26/17 0646 12/28/17 0737 12/29/17 1048 12/30/17 0034  NA 137 133* 127* 130* 127*  K 4.7 5.8* 4.8 4.9 5.2*  CL 95* 94* 91* 89* 87*  CO2 25 21* 21* 23 21*  GLUCOSE 113* 101* 118* 109* 118*  BUN 48* 56* 71* 58* 73*  CREATININE 9.34* 10.52* 10.42* 8.36* 9.68*  CALCIUM 8.6* 8.2* 7.7* 8.9 8.1*  PHOS  --   --  6.8* 7.1* 7.6*   GFR: Estimated Creatinine Clearance: 8.5 mL/min (A) (by C-G formula based on SCr of 9.68 mg/dL (H)). Liver Function Tests: Recent Labs  Lab 12/28/17 0737 12/29/17 1048 12/30/17 0034  ALBUMIN 2.6* 2.5* 2.4*   No results for input(s): LIPASE, AMYLASE in the last 168 hours. No results for input(s): AMMONIA in the last 168 hours. Coagulation Profile: No results for input(s): INR, PROTIME in the last 168 hours. Cardiac Enzymes: No results for input(s): CKTOTAL, CKMB, CKMBINDEX,  TROPONINI in the last 168 hours. BNP (last 3 results) No results for input(s): PROBNP  in the last 8760 hours. HbA1C: No results for input(s): HGBA1C in the last 72 hours. CBG: No results for input(s): GLUCAP in the last 168 hours. Lipid Profile: No results for input(s): CHOL, HDL, LDLCALC, TRIG, CHOLHDL, LDLDIRECT in the last 72 hours. Thyroid Function Tests: No results for input(s): TSH, T4TOTAL, FREET4, T3FREE, THYROIDAB in the last 72 hours. Anemia Panel: No results for input(s): VITAMINB12, FOLATE, FERRITIN, TIBC, IRON, RETICCTPCT in the last 72 hours. Urine analysis:    Component Value Date/Time   COLORURINE RED (A) 07/12/2017 0940   APPEARANCEUR CLOUDY (A) 07/12/2017 0940   LABSPEC  07/12/2017 0940    TEST NOT REPORTED DUE TO COLOR INTERFERENCE OF URINE PIGMENT   PHURINE  07/12/2017 0940    TEST NOT REPORTED DUE TO COLOR INTERFERENCE OF URINE PIGMENT   GLUCOSEU (A) 07/12/2017 0940    TEST NOT REPORTED DUE TO COLOR INTERFERENCE OF URINE PIGMENT   HGBUR (A) 07/12/2017 0940    TEST NOT REPORTED DUE TO COLOR INTERFERENCE OF URINE PIGMENT   BILIRUBINUR (A) 07/12/2017 0940    TEST NOT REPORTED DUE TO COLOR INTERFERENCE OF URINE PIGMENT   KETONESUR (A) 07/12/2017 0940    TEST NOT REPORTED DUE TO COLOR INTERFERENCE OF URINE PIGMENT   PROTEINUR (A) 07/12/2017 0940    TEST NOT REPORTED DUE TO COLOR INTERFERENCE OF URINE PIGMENT   NITRITE (A) 07/12/2017 0940    TEST NOT REPORTED DUE TO COLOR INTERFERENCE OF URINE PIGMENT   LEUKOCYTESUR (A) 07/12/2017 0940    TEST NOT REPORTED DUE TO COLOR INTERFERENCE OF URINE PIGMENT     Ripudeep Rai M.D. [facility] 12/30/2017, 10:33 AM  Pager: [phone] Between 7am to 7pm - call Pager - [phone]  After 7pm go to [email or web address] - password [email or web address]  Call night coverage person covering after 7pm

## 2017-12-30 NOTE — Progress Notes (Signed)
Patient off unit to dialysis.

## 2017-12-30 NOTE — Progress Notes (Signed)
HD tx initiated via HD cath w/o problem, bilat ports: pull/push/flush equally w/o problem, VSS, will cont to monitor while on HD tx

## 2017-12-30 NOTE — Progress Notes (Signed)
Patient back on unit from dialysis. No complaints at this time. Patient in bed resting. Will continue to monitor patient.

## 2017-12-30 NOTE — Progress Notes (Signed)
Patient discharged: Home with family and with O2 tank   Via: Wheelchair   Discharge paperwork given: to patient and family by SWOT nurse Ginger  Reviewed with teach back  IV disconnected  Belongings given to patient  Venetia Night, RN

## 2017-12-31 ENCOUNTER — Telehealth: Payer: Self-pay | Admitting: *Deleted

## 2017-12-31 DIAGNOSIS — E876 Hypokalemia: Secondary | ICD-10-CM | POA: Diagnosis not present

## 2017-12-31 DIAGNOSIS — N2581 Secondary hyperparathyroidism of renal origin: Secondary | ICD-10-CM | POA: Diagnosis not present

## 2017-12-31 DIAGNOSIS — D649 Anemia, unspecified: Secondary | ICD-10-CM | POA: Diagnosis not present

## 2017-12-31 DIAGNOSIS — N186 End stage renal disease: Secondary | ICD-10-CM | POA: Diagnosis not present

## 2017-12-31 DIAGNOSIS — E8770 Fluid overload, unspecified: Secondary | ICD-10-CM | POA: Diagnosis not present

## 2017-12-31 NOTE — Telephone Encounter (Signed)
Per chart review: Admit date: 12/25/2017 Discharge date: 12/29/2017  Recommendations for Outpatient Follow-up:  1. Dr. Orma Flaming, PCP in 1 week. 2. Hemodialysis Center: Continue regular hemodialysis on Tuesdays, Thursdays and Saturdays. 3. Dr. Marshell Garfinkel, Pulmonology: Has an appointment on 12/30/2017 at 11 AM.  However if he misses this appointment, recommend rescheduling it for further evaluation. 4. Recommend follow-up chest x-ray in 4 weeks.  Home Health: PT, RN and aide Equipment/Devices: None  Discharge Condition: Improved and stable. CODE STATUS: Full Diet recommendation: Heart healthy diet.  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 12/29/17   How have you been since you were released from the hospital? "ok"   Do you understand why you were in the hospital? yes   Do you understand the discharge instructions? yes   Where were you discharged to? Home   Items Reviewed:  Medications reviewed: yes  Allergies reviewed: yes  Dietary changes reviewed: yes  Referrals reviewed: yes   Functional Questionnaire:   Activities of Daily Living (ADLs):   He states they are independent in the following: ambulation, bathing and hygiene, feeding, continence, grooming, toileting and dressing States they require assistance with the following: none   Any transportation issues/concerns?: no   Any patient concerns? Yes. Patient would like to discuss medication changes that the hospital made when he comes in.   Confirmed importance and date/time of follow-up visits scheduled yes  Provider Appointment booked  with Dr Rogers Blocker 01/04/18 2:30  Confirmed with patient if condition begins to worsen call PCP or go to the ER.  Patient was given the office number and encouraged to call back with question or concerns.  : yes

## 2018-01-02 DIAGNOSIS — E8770 Fluid overload, unspecified: Secondary | ICD-10-CM | POA: Diagnosis not present

## 2018-01-02 DIAGNOSIS — E876 Hypokalemia: Secondary | ICD-10-CM | POA: Diagnosis not present

## 2018-01-02 DIAGNOSIS — N2581 Secondary hyperparathyroidism of renal origin: Secondary | ICD-10-CM | POA: Diagnosis not present

## 2018-01-02 DIAGNOSIS — N186 End stage renal disease: Secondary | ICD-10-CM | POA: Diagnosis not present

## 2018-01-02 DIAGNOSIS — D649 Anemia, unspecified: Secondary | ICD-10-CM | POA: Diagnosis not present

## 2018-01-04 ENCOUNTER — Inpatient Hospital Stay: Payer: Medicare Other | Admitting: Family Medicine

## 2018-01-05 DIAGNOSIS — E8770 Fluid overload, unspecified: Secondary | ICD-10-CM | POA: Diagnosis not present

## 2018-01-05 DIAGNOSIS — E876 Hypokalemia: Secondary | ICD-10-CM | POA: Diagnosis not present

## 2018-01-05 DIAGNOSIS — N186 End stage renal disease: Secondary | ICD-10-CM | POA: Diagnosis not present

## 2018-01-05 DIAGNOSIS — N2581 Secondary hyperparathyroidism of renal origin: Secondary | ICD-10-CM | POA: Diagnosis not present

## 2018-01-05 DIAGNOSIS — D649 Anemia, unspecified: Secondary | ICD-10-CM | POA: Diagnosis not present

## 2018-01-07 DIAGNOSIS — N186 End stage renal disease: Secondary | ICD-10-CM | POA: Diagnosis not present

## 2018-01-07 DIAGNOSIS — D649 Anemia, unspecified: Secondary | ICD-10-CM | POA: Diagnosis not present

## 2018-01-07 DIAGNOSIS — N2581 Secondary hyperparathyroidism of renal origin: Secondary | ICD-10-CM | POA: Diagnosis not present

## 2018-01-07 DIAGNOSIS — E8770 Fluid overload, unspecified: Secondary | ICD-10-CM | POA: Diagnosis not present

## 2018-01-07 DIAGNOSIS — E876 Hypokalemia: Secondary | ICD-10-CM | POA: Diagnosis not present

## 2018-01-11 ENCOUNTER — Inpatient Hospital Stay: Payer: Medicare Other | Admitting: Family Medicine

## 2018-01-11 ENCOUNTER — Other Ambulatory Visit: Payer: Self-pay | Admitting: Pulmonary Disease

## 2018-01-12 DIAGNOSIS — N186 End stage renal disease: Secondary | ICD-10-CM | POA: Diagnosis not present

## 2018-01-12 DIAGNOSIS — D649 Anemia, unspecified: Secondary | ICD-10-CM | POA: Diagnosis not present

## 2018-01-12 DIAGNOSIS — N2581 Secondary hyperparathyroidism of renal origin: Secondary | ICD-10-CM | POA: Diagnosis not present

## 2018-01-12 DIAGNOSIS — E8770 Fluid overload, unspecified: Secondary | ICD-10-CM | POA: Diagnosis not present

## 2018-01-12 DIAGNOSIS — E876 Hypokalemia: Secondary | ICD-10-CM | POA: Diagnosis not present

## 2018-01-13 DIAGNOSIS — D649 Anemia, unspecified: Secondary | ICD-10-CM | POA: Diagnosis not present

## 2018-01-13 DIAGNOSIS — E876 Hypokalemia: Secondary | ICD-10-CM | POA: Diagnosis not present

## 2018-01-13 DIAGNOSIS — E8770 Fluid overload, unspecified: Secondary | ICD-10-CM | POA: Diagnosis not present

## 2018-01-13 DIAGNOSIS — N2581 Secondary hyperparathyroidism of renal origin: Secondary | ICD-10-CM | POA: Diagnosis not present

## 2018-01-13 DIAGNOSIS — N186 End stage renal disease: Secondary | ICD-10-CM | POA: Diagnosis not present

## 2018-01-14 ENCOUNTER — Ambulatory Visit (HOSPITAL_COMMUNITY)
Admission: RE | Admit: 2018-01-14 | Discharge: 2018-01-14 | Disposition: A | Discharge status: Home / Self Care | Payer: Medicare Other | Source: Ambulatory Visit | Attending: Vascular Surgery | Admitting: Vascular Surgery

## 2018-01-14 ENCOUNTER — Other Ambulatory Visit: Payer: Self-pay

## 2018-01-14 ENCOUNTER — Ambulatory Visit: Payer: Medicare Other | Admitting: Vascular Surgery

## 2018-01-14 ENCOUNTER — Encounter (HOSPITAL_COMMUNITY): Payer: Medicare Other

## 2018-01-14 ENCOUNTER — Ambulatory Visit (INDEPENDENT_AMBULATORY_CARE_PROVIDER_SITE_OTHER): Payer: Medicare Other | Admitting: Vascular Surgery

## 2018-01-14 ENCOUNTER — Encounter: Payer: Self-pay | Admitting: Vascular Surgery

## 2018-01-14 VITALS — BP 110/66 | HR 83 | Temp 97.3°F | Resp 16 | Ht 69.5 in | Wt 157.0 lb

## 2018-01-14 DIAGNOSIS — Z992 Dependence on renal dialysis: Secondary | ICD-10-CM | POA: Diagnosis not present

## 2018-01-14 DIAGNOSIS — E876 Hypokalemia: Secondary | ICD-10-CM | POA: Diagnosis not present

## 2018-01-14 DIAGNOSIS — N186 End stage renal disease: Secondary | ICD-10-CM

## 2018-01-14 DIAGNOSIS — N2581 Secondary hyperparathyroidism of renal origin: Secondary | ICD-10-CM | POA: Diagnosis not present

## 2018-01-14 DIAGNOSIS — D649 Anemia, unspecified: Secondary | ICD-10-CM | POA: Diagnosis not present

## 2018-01-14 DIAGNOSIS — E8770 Fluid overload, unspecified: Secondary | ICD-10-CM | POA: Diagnosis not present

## 2018-01-14 NOTE — Progress Notes (Signed)
Patient is a 57 year old male who returns for postoperative follow-up today and consideration for a new hemodialysis access.  He recently underwent an attempt at a plication September 30.  The vein was too degenerated to plicate so it was ligated and a palindrome catheter was placed for dialysis.  Patient reports no incisional drainage.  He states his catheter is functioning okay.  Past Medical History:  Diagnosis Date  . Allergy   . Blood transfusion without reported diagnosis   . Dialysis patient ([place])    Tues,thurs,sat  . ESRD (end stage renal disease) ([place])   . Hyperlipidemia   . Hypertension     Past Surgical History:  Procedure Laterality Date  . A/V FISTULAGRAM Left 12/15/2016   Procedure: A/V Fistulagram - left;  Surgeon: Angelia Mould, MD;  Location: [facility];  Service: Cardiovascular;  Laterality: Left;  . AV FISTULA PLACEMENT Left 08/28/2016   Procedure: LEFT UPPER  ARM ARTERIOVENOUS (AV) FISTULA CREATION;  Surgeon: Angelia Mould, MD;  Location: [facility];  Service: Vascular;  Laterality: Left;  . DIALYSIS/PERMA CATHETER INSERTION Right 12/21/2017   Procedure: INSERTION OF DIALYSIS CATHETER Right Internal Jugular .;  Surgeon: Elam Dutch, MD;  Location: [facility];  Service: Vascular;  Laterality: Right;  . FISTULA SUPERFICIALIZATION Left 09/23/2017   Procedure: FISTULA PLICATION LEFT ARM;  Surgeon: Elam Dutch, MD;  Location: [facility];  Service: Vascular;  Laterality: Left;  . FISTULOGRAM Left 12/21/2017   Procedure: FISTULOGRAM with Balloon Angioplasty.;  Surgeon: Elam Dutch, MD;  Location: [facility] OR;  Service: Vascular;  Laterality: Left;  . IR THORACENTESIS ASP PLEURAL SPACE W/IMG GUIDE  08/22/2017   1.2 L -right-sided  . IR THORACENTESIS ASP PLEURAL SPACE W/IMG GUIDE  10/19/2017  . REVISON OF ARTERIOVENOUS FISTULA Left 06/04/9700   Procedure: PLICATION and Ligation of F LEFT ARM ARTERIOVENOUS FISTULA;  Surgeon: Elam Dutch, MD;  Location: [facility]  OR;  Service: Vascular;  Laterality: Left;  . stent in kidneys     dec 2017  . TRANSTHORACIC ECHOCARDIOGRAM  09/22/2017    Severe LVH.  Normal function -EF 55-60%.  GRII DD.  Moderate RV dilation with mildly reduced RV function.   Fixed right coronary cusp with very mild aortic stenosis.  The myocardium has a speckled appearance --> .   Recommend cardiac MRI to evaluate for amyloid   Current Outpatient Medications on File Prior to Visit  Medication Sig Dispense Refill  . azelastine (ASTELIN) 0.1 % nasal spray Place 1 spray into both nostrils 2 (two) times daily. Use in each nostril as directed 30 mL 3  . chlorpheniramine (CHLOR-TRIMETON) 4 MG tablet Take 2 tablets (8 mg total) by mouth every 6 (six) hours as needed for allergies.    . cinacalcet (SENSIPAR) 30 MG tablet Take 30 mg by mouth daily.  11  . cloNIDine (CATAPRES) 0.1 MG tablet Take 1 tablet (0.1 mg total) by mouth 2 (two) times daily. 60 tablet 11  . dextromethorphan-guaiFENesin (ROBITUSSIN-DM) 10-100 MG/5ML liquid Take 10 mLs by mouth every 12 (twelve) hours as needed for cough.    . fluticasone (FLONASE) 50 MCG/ACT nasal spray SPRAY 2 SPRAYS INTO EACH NOSTRIL EVERY DAY 48 g 2  . furosemide (LASIX) 80 MG tablet Take 1 tablet (80 mg total) by mouth 2 (two) times daily. 180 tablet 1  . guaiFENesin (MUCINEX) 600 MG 12 hr tablet Take 2 tablets (1,200 mg total) by mouth 2 (two) times daily as needed for cough.    Marland Kitchen  labetalol (NORMODYNE) 300 MG tablet Take 1 tablet (300 mg total) by mouth 2 (two) times daily. 180 tablet 3  . lidocaine-prilocaine (EMLA) cream Apply 1 application topically once.   3  . loratadine (CLARITIN) 10 MG tablet Take 10 mg by mouth daily as needed for allergies.    Marland Kitchen losartan (COZAAR) 50 MG tablet Take 1 tablet (50 mg total) by mouth daily. 90 tablet 3  . multivitamin (RENA-VIT) TABS tablet Take 1 tablet by mouth daily.    Marland Kitchen NIFEdipine (PROCARDIA XL/ADALAT-CC) 60 MG 24 hr tablet Take 60 mg by mouth 2 (two) times  daily.    . pravastatin (PRAVACHOL) 80 MG tablet Take 1 tablet (80 mg total) by mouth at bedtime. 90 tablet 3  . predniSONE (DELTASONE) 5 MG tablet Take 5 mg by mouth daily with breakfast.    . sevelamer carbonate (RENVELA) 800 MG tablet Take 1 tablet (800 mg total) by mouth 3 (three) times daily with meals. 90 tablet 0  . tacrolimus (PROGRAF) 5 MG capsule Take 5 mg by mouth 2 (two) times daily.    Marland Kitchen oxyCODONE-acetaminophen (PERCOCET) 5-325 MG tablet Take 1 tablet by mouth every 6 (six) hours as needed for severe pain. (Patient not taking: Reported on 01/14/2018) 15 tablet 0   No current facility-administered medications on file prior to visit.    Allergies  Allergen Reactions  . Azithromycin Nausea And Vomiting and Other (See Comments)    Chest tightness    Physical exam:  Vitals:   01/14/18 1320  BP: 110/66  Pulse: 83  Resp: 16  Temp: (!) 97.3 F (36.3 C)  TempSrc: Oral  SpO2: 97%  Weight: 157 lb (71.2 kg)  Height: 5' 9.5" (1.765 m)    Left upper extremity: Healed left upper arm incision no drainage no erythema pulsatile fistula over the proximal 7 cm.  Right upper extremity 2+ brachial radial pulse right arm  Data: Patient had a vein mapping ultrasound and arterial duplex ultrasound today.  Patient has a good quality cephalic vein from the wrist all the way to the shoulder in the right arm.  He also has normal arterial anatomy on the right side.  He also has a 4 to 5 mm basilic vein on the right arm.  In the left arm the left basilic vein is 4 to 6 mm in diameter.  Assessment: Patient with end-stage renal disease currently using a catheter needs long-term hemodialysis access.  I offered the patient a right radiocephalic AV fistula today.  He is reluctant to proceed to the right arm.  I also discussed with him the possibility of converting his brachiocephalic fistula to an AV graft utilizing the axillary vein as venous outflow.  He does have a good quality basilic vein in the  left arm as well.  He potentially could get a left basilic vein transposition fistula.  However I would place a radiocephalic fistula on the right side before utilizing the left basilic vein.  Plan: The patient wishes to think about his options and get back to me.  He will call us if he wishes to have a new access placed in the future.  If he decides to proceed with a left arm AV graft I would give this at least another 4 weeks to heal as he has a fairly lengthy fragile scar in his left arm currently.  We could proceed with a right radiocephalic fistula at any time.  Ruta Hinds, MD [facility] Office: [phone]  Pager: [phone]

## 2018-01-16 DIAGNOSIS — D649 Anemia, unspecified: Secondary | ICD-10-CM | POA: Diagnosis not present

## 2018-01-16 DIAGNOSIS — E8770 Fluid overload, unspecified: Secondary | ICD-10-CM | POA: Diagnosis not present

## 2018-01-16 DIAGNOSIS — N186 End stage renal disease: Secondary | ICD-10-CM | POA: Diagnosis not present

## 2018-01-16 DIAGNOSIS — N2581 Secondary hyperparathyroidism of renal origin: Secondary | ICD-10-CM | POA: Diagnosis not present

## 2018-01-16 DIAGNOSIS — E876 Hypokalemia: Secondary | ICD-10-CM | POA: Diagnosis not present

## 2018-01-19 DIAGNOSIS — D649 Anemia, unspecified: Secondary | ICD-10-CM | POA: Diagnosis not present

## 2018-01-19 DIAGNOSIS — N2581 Secondary hyperparathyroidism of renal origin: Secondary | ICD-10-CM | POA: Diagnosis not present

## 2018-01-19 DIAGNOSIS — E8770 Fluid overload, unspecified: Secondary | ICD-10-CM | POA: Diagnosis not present

## 2018-01-19 DIAGNOSIS — N186 End stage renal disease: Secondary | ICD-10-CM | POA: Diagnosis not present

## 2018-01-19 DIAGNOSIS — E876 Hypokalemia: Secondary | ICD-10-CM | POA: Diagnosis not present

## 2018-01-20 ENCOUNTER — Ambulatory Visit (INDEPENDENT_AMBULATORY_CARE_PROVIDER_SITE_OTHER): Payer: Medicare Other | Admitting: Pulmonary Disease

## 2018-01-20 ENCOUNTER — Encounter: Payer: Self-pay | Admitting: Pulmonary Disease

## 2018-01-20 VITALS — BP 124/88 | HR 67 | Ht 68.0 in | Wt 166.0 lb

## 2018-01-20 DIAGNOSIS — R05 Cough: Secondary | ICD-10-CM | POA: Diagnosis not present

## 2018-01-20 DIAGNOSIS — R059 Cough, unspecified: Secondary | ICD-10-CM

## 2018-01-20 DIAGNOSIS — J90 Pleural effusion, not elsewhere classified: Secondary | ICD-10-CM | POA: Diagnosis not present

## 2018-01-20 LAB — PULMONARY FUNCTION TEST
DL/VA % PRED: 102 %
DL/VA: 4.61 ml/min/mmHg/L
DLCO COR: 20.74 ml/min/mmHg
DLCO cor % pred: 69 %
DLCO unc % pred: 59 %
DLCO unc: 17.7 ml/min/mmHg
FEF 25-75 POST: 0.62 L/s
FEF 25-75 PRE: 1.56 L/s
FEF2575-%Change-Post: -60 %
FEF2575-%PRED-PRE: 53 %
FEF2575-%Pred-Post: 21 %
FEV1-%Change-Post: -20 %
FEV1-%PRED-PRE: 78 %
FEV1-%Pred-Post: 62 %
FEV1-Post: 1.86 L
FEV1-Pre: 2.34 L
FEV1FVC-%Change-Post: -12 %
FEV1FVC-%PRED-PRE: 93 %
FEV6-%CHANGE-POST: -9 %
FEV6-%PRED-PRE: 86 %
FEV6-%Pred-Post: 78 %
FEV6-POST: 2.86 L
FEV6-Pre: 3.16 L
FEV6FVC-%Change-Post: 0 %
FEV6FVC-%PRED-POST: 102 %
FEV6FVC-%Pred-Pre: 103 %
FVC-%Change-Post: -9 %
FVC-%PRED-POST: 76 %
FVC-%PRED-PRE: 83 %
FVC-POST: 2.88 L
FVC-PRE: 3.17 L
POST FEV6/FVC RATIO: 99 %
PRE FEV1/FVC RATIO: 74 %
Post FEV1/FVC ratio: 64 %
Pre FEV6/FVC Ratio: 100 %
RV % pred: 61 %
RV: 1.26 L
TLC % pred: 68 %
TLC: 4.53 L

## 2018-01-20 NOTE — Patient Instructions (Signed)
I am glad you are feeling better after your recent admission We will get a chest x-ray to make sure that the pneumonia and effusion is clearing Follow-up in 3 months.

## 2018-01-20 NOTE — Progress Notes (Signed)
Patient completed full PFT today. 

## 2018-01-20 NOTE — Progress Notes (Addendum)
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  He also has recurrent right effusion status post thoracentesis x2.  First time on 6/10 at [facility] and the second time on 7/29 at [facility].  Pleural fluid analysis was suggestive of transudative effusion.  Pets: No pets Occupation: Used to work for [employer].  Currently retired Exposures: No known exposures, no mold, hot tub Smoking history: Never smoker Travel history: Originally from [place].  No significant travel.  Interim history: Hospitalized in early October 2019 for respiratory failure.  He had a CT scan showing right lower lobe atelectasis/consolidation with small effusion.  He was treated as HCAP with antibiotics  Returns to clinic today with improved symptoms.  States that cough is resolved.  No dyspnea, sputum production, fevers, chills.  Outpatient Encounter Medications as of 01/20/2018  Medication Sig  . azelastine (ASTELIN) 0.1 % nasal spray Place 1 spray into both nostrils 2 (two) times daily. Use in each nostril as directed  . chlorpheniramine (CHLOR-TRIMETON) 4 MG tablet Take 2 tablets (8 mg total) by mouth every 6 (six) hours as needed for allergies.  . cinacalcet (SENSIPAR) 30 MG tablet Take 30 mg by mouth daily.  . cloNIDine (CATAPRES) 0.1 MG tablet Take 1 tablet (0.1 mg total) by mouth 2 (two) times daily.  Marland Kitchen  dextromethorphan-guaiFENesin (ROBITUSSIN-DM) 10-100 MG/5ML liquid Take 10 mLs by mouth every 12 (twelve) hours as needed for cough.  . fluticasone (FLONASE) 50 MCG/ACT nasal spray SPRAY 2 SPRAYS INTO EACH NOSTRIL EVERY DAY  . furosemide (LASIX) 80 MG tablet Take 1 tablet (80 mg total) by mouth 2 (two) times daily.  Marland Kitchen guaiFENesin (MUCINEX) 600 MG 12 hr tablet Take 2 tablets (1,200 mg total) by mouth 2 (two) times daily as needed for cough.  . labetalol (NORMODYNE) 300 MG tablet Take 1 tablet (300 mg total) by mouth 2 (two) times daily.  Marland Kitchen lidocaine-prilocaine (EMLA) cream Apply 1 application topically once.   . loratadine (CLARITIN) 10 MG tablet Take 10 mg by mouth daily as needed for allergies.  Marland Kitchen losartan (COZAAR) 50 MG tablet Take 1 tablet (50 mg total) by mouth daily.  . multivitamin (RENA-VIT) TABS tablet Take 1 tablet by mouth daily.  Marland Kitchen NIFEdipine (PROCARDIA XL/ADALAT-CC) 60 MG 24 hr tablet Take 60 mg by mouth 2 (two) times daily.  Marland Kitchen oxyCODONE-acetaminophen (PERCOCET) 5-325 MG tablet Take 1 tablet by mouth every 6 (six) hours as needed for severe pain.  . pravastatin (PRAVACHOL) 80 MG tablet Take 1 tablet (80 mg total) by mouth at bedtime.  . predniSONE (DELTASONE) 5 MG tablet Take 5 mg by mouth daily with breakfast.  . sevelamer carbonate (RENVELA) 800 MG tablet Take 1 tablet (800 mg total) by mouth 3 (three) times daily with meals.  . tacrolimus (PROGRAF) 5 MG capsule Take 5 mg by mouth 2 (two) times daily.   No facility-administered encounter medications on file as of 01/20/2018.    Physical Exam: Blood pressure 124/88,  pulse 67, height 5\' 8"  (1.727 m), weight 166 lb (75.3 kg), SpO2 99 %. Gen:      No acute distress HEENT:  EOMI, sclera anicteric Neck:     No masses; no thyromegaly Lungs:    Clear to auscultation bilaterally; normal respiratory effort CV:         Regular rate and rhythm; no murmurs Abd:      + bowel sounds; soft, non-tender; no palpable masses, no distension Ext:     No edema; adequate peripheral perfusion Skin:      Warm and dry; no rash Neuro: alert and oriented x 3 Psych: normal mood and affect  Data Reviewed: Imaging Chest x-ray 10/18/2017- right-sided effusion with basilar infiltrate. Chest x-ray 10/19/2017- decrease in right pleural effusion postthoracentesis.  Mild central pulmonary vascular engorgement.    CT chest 12/25/2017- lower lobe consolidation with small right effusion, minimal atelectasis in the left lower lobe.  Nonspecific enlargement of lymph nodes in the mediastinum.  Cardiomegaly, aortic atherosclerosis I have reviewed the images personally.  PFTs 01/20/2018 FVC 3.17 [83%), FEV1 2.34 [78%), F/F 94, TLC 68%, DLCO 59%, DLCO/302% Mild restriction.  Mild diffusion defect that corrects for alveolar volume.  Cardiac Echo 09/22/2017-  Normal LV systolic function; severe LVH; myocardium with speckled   appearance; fixed right coronary cusp with very mild AS (mean   gradient 10 mmHg) and mild AI; mildly dilated aortic   root/ascending aorta; mild biatrial enlargement; moderate RVE   with moderate RV dysfunction; small pericardial effusion;   findings suggestive of amyloid; suggest cardiac MRI or Tc-PYP   scan to further assess.  Labs Pleural fluid studies 10/19/17 LDH 68, total protein less than 3 WBC 495, 51% lymphs, 40% monocyte macrophage Micro, Cytology- negative  Assessment:  Recurrent right pleural effusion Underwent thoracentesis x2 in past with labs suggestive of transudative effusion from heart failure, kidney failure   Recent hospitalization for HCAP He has a recent hospitalization for small right effusion in the setting of right lung atelectasis/consolidation. Get a follow-up x-ray to ensure resolution of infiltrate.  Chronic cough Likely early upper elevation due to postnasal drip, GERD Continue chlorphentermine, Flonase, Prilosec PFTs today reviewed with no significant obstruction.  Plan/Recommendations: -  Chlorpheniramine, Flonase, prilosec - CXR  Marshell Garfinkel MD [facility] 01/20/2018, 1:53 PM  CC: Orma Flaming, MD

## 2018-01-21 DIAGNOSIS — N186 End stage renal disease: Secondary | ICD-10-CM | POA: Diagnosis not present

## 2018-01-21 DIAGNOSIS — D649 Anemia, unspecified: Secondary | ICD-10-CM | POA: Diagnosis not present

## 2018-01-21 DIAGNOSIS — N2581 Secondary hyperparathyroidism of renal origin: Secondary | ICD-10-CM | POA: Diagnosis not present

## 2018-01-21 DIAGNOSIS — E8770 Fluid overload, unspecified: Secondary | ICD-10-CM | POA: Diagnosis not present

## 2018-01-21 DIAGNOSIS — E876 Hypokalemia: Secondary | ICD-10-CM | POA: Diagnosis not present

## 2018-01-22 DIAGNOSIS — N186 End stage renal disease: Secondary | ICD-10-CM | POA: Diagnosis not present

## 2018-01-22 DIAGNOSIS — T8612 Kidney transplant failure: Secondary | ICD-10-CM | POA: Diagnosis not present

## 2018-01-22 DIAGNOSIS — Z992 Dependence on renal dialysis: Secondary | ICD-10-CM | POA: Diagnosis not present

## 2018-01-23 DIAGNOSIS — N186 End stage renal disease: Secondary | ICD-10-CM | POA: Diagnosis not present

## 2018-01-23 DIAGNOSIS — E876 Hypokalemia: Secondary | ICD-10-CM | POA: Diagnosis not present

## 2018-01-23 DIAGNOSIS — D649 Anemia, unspecified: Secondary | ICD-10-CM | POA: Diagnosis not present

## 2018-01-23 DIAGNOSIS — N2581 Secondary hyperparathyroidism of renal origin: Secondary | ICD-10-CM | POA: Diagnosis not present

## 2018-01-23 DIAGNOSIS — D508 Other iron deficiency anemias: Secondary | ICD-10-CM | POA: Diagnosis not present

## 2018-01-26 DIAGNOSIS — D649 Anemia, unspecified: Secondary | ICD-10-CM | POA: Diagnosis not present

## 2018-01-26 DIAGNOSIS — N186 End stage renal disease: Secondary | ICD-10-CM | POA: Diagnosis not present

## 2018-01-26 DIAGNOSIS — E876 Hypokalemia: Secondary | ICD-10-CM | POA: Diagnosis not present

## 2018-01-26 DIAGNOSIS — D508 Other iron deficiency anemias: Secondary | ICD-10-CM | POA: Diagnosis not present

## 2018-01-26 DIAGNOSIS — N2581 Secondary hyperparathyroidism of renal origin: Secondary | ICD-10-CM | POA: Diagnosis not present

## 2018-01-27 ENCOUNTER — Ambulatory Visit: Payer: Medicare Other | Admitting: Cardiology

## 2018-01-27 NOTE — Progress Notes (Deleted)
PCP: Orma Flaming, MD  Clinic Note: No chief complaint on file.   HPI: Ryan [name] is a 57 y.o. male with a PMH below who presents today for ***. Ryan [name] is a 57 y.o. male who is being seen today for the evaluation of *** at the request of Orma Flaming, MD.  Trey Sailors was last seen on ***  Recent Hospitalizations: ***  Studies Personally Reviewed - (if available, images/films reviewed: From Epic Chart or Care Everywhere)  ***  Interval History: ***   No chest pain or shortness of breath with rest or exertion. No PND, orthopnea or edema. No palpitations, lightheadedness, dizziness, weakness or syncope/near syncope. No TIA/amaurosis fugax symptoms. No melena, hematochezia, hematuria, or epstaxis. No claudication.  ROS: A comprehensive was performed. ROS   I have reviewed and (if needed) personally updated the patient's problem list, medications, allergies, past medical and surgical history, social and family history.   Past Medical History:  Diagnosis Date  . Allergy   . Blood transfusion without reported diagnosis   . Dialysis patient ([place])    Tues,thurs,sat  . ESRD (end stage renal disease) ([place])   . Hyperlipidemia   . Hypertension     Past Surgical History:  Procedure Laterality Date  . A/V FISTULAGRAM Left 12/15/2016   Procedure: A/V Fistulagram - left;  Surgeon: Angelia Mould, MD;  Location: [facility];  Service: Cardiovascular;  Laterality: Left;  . AV FISTULA PLACEMENT Left 08/28/2016   Procedure: LEFT UPPER  ARM ARTERIOVENOUS (AV) FISTULA CREATION;  Surgeon: Angelia Mould, MD;  Location: [place];  Service: Vascular;  Laterality: Left;  . DIALYSIS/PERMA CATHETER INSERTION Right 12/21/2017   Procedure: INSERTION OF DIALYSIS CATHETER Right Internal Jugular .;  Surgeon: Elam Dutch, MD;  Location: [place];  Service: Vascular;  Laterality: Right;  . FISTULA SUPERFICIALIZATION Left 09/23/2017   Procedure:  FISTULA PLICATION LEFT ARM;  Surgeon: Elam Dutch, MD;  Location: [facility];  Service: Vascular;  Laterality: Left;  . FISTULOGRAM Left 12/21/2017   Procedure: FISTULOGRAM with Balloon Angioplasty.;  Surgeon: Elam Dutch, MD;  Location: [facility] OR;  Service: Vascular;  Laterality: Left;  . IR THORACENTESIS ASP PLEURAL SPACE W/IMG GUIDE  08/22/2017   1.2 L -right-sided  . IR THORACENTESIS ASP PLEURAL SPACE W/IMG GUIDE  10/19/2017  . REVISON OF ARTERIOVENOUS FISTULA Left 11/15/537   Procedure: PLICATION and Ligation of F LEFT ARM ARTERIOVENOUS FISTULA;  Surgeon: Elam Dutch, MD;  Location: [facility] OR;  Service: Vascular;  Laterality: Left;  . stent in kidneys     dec 2017  . TRANSTHORACIC ECHOCARDIOGRAM  09/22/2017    Severe LVH.  Normal function -EF 55-60%.  GRII DD.  Moderate RV dilation with mildly reduced RV function.   Fixed right coronary cusp with very mild aortic stenosis.  The myocardium has a speckled appearance --> .   Recommend cardiac MRI to evaluate for amyloid    No outpatient medications have been marked as taking for the 01/27/18 encounter (Appointment) with Leonie Man, MD.    Allergies  Allergen Reactions  . Azithromycin Nausea And Vomiting and Other (See Comments)    Chest tightness    Social History   Tobacco Use  . Smoking status: Never Smoker  . Smokeless tobacco: Never Used  Substance Use Topics  . Alcohol use: No  . Drug use: No   Social History   Social History Narrative   Divorced   Does volunteer  ministry work   3 years of college.   Does not drink, does not smoke.  Does not use illicit drugs.   Son in nursing school at [employer]    family history includes Alcohol abuse in his maternal grandfather; Cancer in his father; Heart disease in his maternal grandmother; Heart disease (age of onset: 49) in his mother; Hypertension in his mother and sister; Kidney cancer in his father; Learning disabilities in his paternal grandmother; Mental illness in  his paternal grandmother; Stroke in his mother and paternal grandfather.  Wt Readings from Last 3 Encounters:  01/20/18 166 lb (75.3 kg)  01/14/18 157 lb (71.2 kg)  12/30/17 162 lb 8 oz (73.7 kg)    PHYSICAL EXAM There were no vitals taken for this visit. Physical Exam   Adult ECG Report  Rate: *** ;  Rhythm: {rhythm:17366};   Narrative Interpretation: ***   Other studies Reviewed: Additional studies/ records that were reviewed today include:  Recent Labs:  ***     ASSESSMENT / PLAN: Problem List Items Addressed This Visit    None       I spent a total of ***minutes with the patient and chart review. >  50% of the time was spent in direct patient consultation.   Current medicines are reviewed at length with the patient today.  (+/- concerns) *** The following changes have been made:  ***  There are no Patient Instructions on file for this visit.   Studies Ordered:   No orders of the defined types were placed in this encounter.     Glenetta Hew, M.D., M.S. Interventional Cardiologist   Pager # [phone] Phone # [phone] [address] [place], [place] [address]   Thank you for choosing [facility]!!

## 2018-01-28 DIAGNOSIS — D508 Other iron deficiency anemias: Secondary | ICD-10-CM | POA: Diagnosis not present

## 2018-01-28 DIAGNOSIS — N186 End stage renal disease: Secondary | ICD-10-CM | POA: Diagnosis not present

## 2018-01-28 DIAGNOSIS — D649 Anemia, unspecified: Secondary | ICD-10-CM | POA: Diagnosis not present

## 2018-01-28 DIAGNOSIS — E876 Hypokalemia: Secondary | ICD-10-CM | POA: Diagnosis not present

## 2018-01-28 DIAGNOSIS — N2581 Secondary hyperparathyroidism of renal origin: Secondary | ICD-10-CM | POA: Diagnosis not present

## 2018-01-30 DIAGNOSIS — D649 Anemia, unspecified: Secondary | ICD-10-CM | POA: Diagnosis not present

## 2018-01-30 DIAGNOSIS — N2581 Secondary hyperparathyroidism of renal origin: Secondary | ICD-10-CM | POA: Diagnosis not present

## 2018-01-30 DIAGNOSIS — D508 Other iron deficiency anemias: Secondary | ICD-10-CM | POA: Diagnosis not present

## 2018-01-30 DIAGNOSIS — E876 Hypokalemia: Secondary | ICD-10-CM | POA: Diagnosis not present

## 2018-01-30 DIAGNOSIS — N186 End stage renal disease: Secondary | ICD-10-CM | POA: Diagnosis not present

## 2018-02-02 DIAGNOSIS — E876 Hypokalemia: Secondary | ICD-10-CM | POA: Diagnosis not present

## 2018-02-02 DIAGNOSIS — N2581 Secondary hyperparathyroidism of renal origin: Secondary | ICD-10-CM | POA: Diagnosis not present

## 2018-02-02 DIAGNOSIS — N186 End stage renal disease: Secondary | ICD-10-CM | POA: Diagnosis not present

## 2018-02-02 DIAGNOSIS — D649 Anemia, unspecified: Secondary | ICD-10-CM | POA: Diagnosis not present

## 2018-02-02 DIAGNOSIS — D508 Other iron deficiency anemias: Secondary | ICD-10-CM | POA: Diagnosis not present

## 2018-02-03 ENCOUNTER — Encounter: Payer: Self-pay | Admitting: Cardiology

## 2018-02-04 DIAGNOSIS — D649 Anemia, unspecified: Secondary | ICD-10-CM | POA: Diagnosis not present

## 2018-02-04 DIAGNOSIS — D508 Other iron deficiency anemias: Secondary | ICD-10-CM | POA: Diagnosis not present

## 2018-02-04 DIAGNOSIS — N186 End stage renal disease: Secondary | ICD-10-CM | POA: Diagnosis not present

## 2018-02-04 DIAGNOSIS — N2581 Secondary hyperparathyroidism of renal origin: Secondary | ICD-10-CM | POA: Diagnosis not present

## 2018-02-04 DIAGNOSIS — E876 Hypokalemia: Secondary | ICD-10-CM | POA: Diagnosis not present

## 2018-02-05 ENCOUNTER — Other Ambulatory Visit: Payer: Self-pay | Admitting: Pulmonary Disease

## 2018-02-06 DIAGNOSIS — N2581 Secondary hyperparathyroidism of renal origin: Secondary | ICD-10-CM | POA: Diagnosis not present

## 2018-02-06 DIAGNOSIS — D649 Anemia, unspecified: Secondary | ICD-10-CM | POA: Diagnosis not present

## 2018-02-06 DIAGNOSIS — N186 End stage renal disease: Secondary | ICD-10-CM | POA: Diagnosis not present

## 2018-02-06 DIAGNOSIS — D508 Other iron deficiency anemias: Secondary | ICD-10-CM | POA: Diagnosis not present

## 2018-02-06 DIAGNOSIS — E876 Hypokalemia: Secondary | ICD-10-CM | POA: Diagnosis not present

## 2018-02-09 DIAGNOSIS — N186 End stage renal disease: Secondary | ICD-10-CM | POA: Diagnosis not present

## 2018-02-09 DIAGNOSIS — D508 Other iron deficiency anemias: Secondary | ICD-10-CM | POA: Diagnosis not present

## 2018-02-09 DIAGNOSIS — E876 Hypokalemia: Secondary | ICD-10-CM | POA: Diagnosis not present

## 2018-02-09 DIAGNOSIS — N2581 Secondary hyperparathyroidism of renal origin: Secondary | ICD-10-CM | POA: Diagnosis not present

## 2018-02-09 DIAGNOSIS — D649 Anemia, unspecified: Secondary | ICD-10-CM | POA: Diagnosis not present

## 2018-02-11 DIAGNOSIS — D508 Other iron deficiency anemias: Secondary | ICD-10-CM | POA: Diagnosis not present

## 2018-02-11 DIAGNOSIS — N2581 Secondary hyperparathyroidism of renal origin: Secondary | ICD-10-CM | POA: Diagnosis not present

## 2018-02-11 DIAGNOSIS — N186 End stage renal disease: Secondary | ICD-10-CM | POA: Diagnosis not present

## 2018-02-11 DIAGNOSIS — D649 Anemia, unspecified: Secondary | ICD-10-CM | POA: Diagnosis not present

## 2018-02-11 DIAGNOSIS — E876 Hypokalemia: Secondary | ICD-10-CM | POA: Diagnosis not present

## 2018-02-13 DIAGNOSIS — N2581 Secondary hyperparathyroidism of renal origin: Secondary | ICD-10-CM | POA: Diagnosis not present

## 2018-02-13 DIAGNOSIS — N186 End stage renal disease: Secondary | ICD-10-CM | POA: Diagnosis not present

## 2018-02-13 DIAGNOSIS — E876 Hypokalemia: Secondary | ICD-10-CM | POA: Diagnosis not present

## 2018-02-13 DIAGNOSIS — D649 Anemia, unspecified: Secondary | ICD-10-CM | POA: Diagnosis not present

## 2018-02-13 DIAGNOSIS — D508 Other iron deficiency anemias: Secondary | ICD-10-CM | POA: Diagnosis not present

## 2018-02-15 DIAGNOSIS — N2581 Secondary hyperparathyroidism of renal origin: Secondary | ICD-10-CM | POA: Diagnosis not present

## 2018-02-15 DIAGNOSIS — D649 Anemia, unspecified: Secondary | ICD-10-CM | POA: Diagnosis not present

## 2018-02-15 DIAGNOSIS — D508 Other iron deficiency anemias: Secondary | ICD-10-CM | POA: Diagnosis not present

## 2018-02-15 DIAGNOSIS — E876 Hypokalemia: Secondary | ICD-10-CM | POA: Diagnosis not present

## 2018-02-15 DIAGNOSIS — N186 End stage renal disease: Secondary | ICD-10-CM | POA: Diagnosis not present

## 2018-02-17 DIAGNOSIS — N2581 Secondary hyperparathyroidism of renal origin: Secondary | ICD-10-CM | POA: Diagnosis not present

## 2018-02-17 DIAGNOSIS — E876 Hypokalemia: Secondary | ICD-10-CM | POA: Diagnosis not present

## 2018-02-17 DIAGNOSIS — D508 Other iron deficiency anemias: Secondary | ICD-10-CM | POA: Diagnosis not present

## 2018-02-17 DIAGNOSIS — D649 Anemia, unspecified: Secondary | ICD-10-CM | POA: Diagnosis not present

## 2018-02-17 DIAGNOSIS — N186 End stage renal disease: Secondary | ICD-10-CM | POA: Diagnosis not present

## 2018-02-18 ENCOUNTER — Other Ambulatory Visit: Payer: Self-pay | Admitting: Cardiology

## 2018-02-20 DIAGNOSIS — E876 Hypokalemia: Secondary | ICD-10-CM | POA: Diagnosis not present

## 2018-02-20 DIAGNOSIS — D649 Anemia, unspecified: Secondary | ICD-10-CM | POA: Diagnosis not present

## 2018-02-20 DIAGNOSIS — D508 Other iron deficiency anemias: Secondary | ICD-10-CM | POA: Diagnosis not present

## 2018-02-20 DIAGNOSIS — N186 End stage renal disease: Secondary | ICD-10-CM | POA: Diagnosis not present

## 2018-02-20 DIAGNOSIS — N2581 Secondary hyperparathyroidism of renal origin: Secondary | ICD-10-CM | POA: Diagnosis not present

## 2018-02-21 DIAGNOSIS — N186 End stage renal disease: Secondary | ICD-10-CM | POA: Diagnosis not present

## 2018-02-21 DIAGNOSIS — Z992 Dependence on renal dialysis: Secondary | ICD-10-CM | POA: Diagnosis not present

## 2018-02-21 DIAGNOSIS — T8612 Kidney transplant failure: Secondary | ICD-10-CM | POA: Diagnosis not present

## 2018-02-23 DIAGNOSIS — N186 End stage renal disease: Secondary | ICD-10-CM | POA: Diagnosis not present

## 2018-02-23 DIAGNOSIS — N2581 Secondary hyperparathyroidism of renal origin: Secondary | ICD-10-CM | POA: Diagnosis not present

## 2018-02-23 DIAGNOSIS — E876 Hypokalemia: Secondary | ICD-10-CM | POA: Diagnosis not present

## 2018-02-25 DIAGNOSIS — N2581 Secondary hyperparathyroidism of renal origin: Secondary | ICD-10-CM | POA: Diagnosis not present

## 2018-02-25 DIAGNOSIS — E876 Hypokalemia: Secondary | ICD-10-CM | POA: Diagnosis not present

## 2018-02-25 DIAGNOSIS — N186 End stage renal disease: Secondary | ICD-10-CM | POA: Diagnosis not present

## 2018-02-27 DIAGNOSIS — N186 End stage renal disease: Secondary | ICD-10-CM | POA: Diagnosis not present

## 2018-02-27 DIAGNOSIS — N2581 Secondary hyperparathyroidism of renal origin: Secondary | ICD-10-CM | POA: Diagnosis not present

## 2018-02-27 DIAGNOSIS — E876 Hypokalemia: Secondary | ICD-10-CM | POA: Diagnosis not present

## 2018-03-02 DIAGNOSIS — N2581 Secondary hyperparathyroidism of renal origin: Secondary | ICD-10-CM | POA: Diagnosis not present

## 2018-03-02 DIAGNOSIS — N186 End stage renal disease: Secondary | ICD-10-CM | POA: Diagnosis not present

## 2018-03-02 DIAGNOSIS — E876 Hypokalemia: Secondary | ICD-10-CM | POA: Diagnosis not present

## 2018-03-04 DIAGNOSIS — N186 End stage renal disease: Secondary | ICD-10-CM | POA: Diagnosis not present

## 2018-03-04 DIAGNOSIS — E876 Hypokalemia: Secondary | ICD-10-CM | POA: Diagnosis not present

## 2018-03-04 DIAGNOSIS — N2581 Secondary hyperparathyroidism of renal origin: Secondary | ICD-10-CM | POA: Diagnosis not present

## 2018-03-06 DIAGNOSIS — N2581 Secondary hyperparathyroidism of renal origin: Secondary | ICD-10-CM | POA: Diagnosis not present

## 2018-03-06 DIAGNOSIS — E876 Hypokalemia: Secondary | ICD-10-CM | POA: Diagnosis not present

## 2018-03-06 DIAGNOSIS — N186 End stage renal disease: Secondary | ICD-10-CM | POA: Diagnosis not present

## 2018-03-09 DIAGNOSIS — N2581 Secondary hyperparathyroidism of renal origin: Secondary | ICD-10-CM | POA: Diagnosis not present

## 2018-03-09 DIAGNOSIS — N186 End stage renal disease: Secondary | ICD-10-CM | POA: Diagnosis not present

## 2018-03-09 DIAGNOSIS — E876 Hypokalemia: Secondary | ICD-10-CM | POA: Diagnosis not present

## 2018-03-11 DIAGNOSIS — N186 End stage renal disease: Secondary | ICD-10-CM | POA: Diagnosis not present

## 2018-03-11 DIAGNOSIS — N2581 Secondary hyperparathyroidism of renal origin: Secondary | ICD-10-CM | POA: Diagnosis not present

## 2018-03-11 DIAGNOSIS — E876 Hypokalemia: Secondary | ICD-10-CM | POA: Diagnosis not present

## 2018-03-13 DIAGNOSIS — N2581 Secondary hyperparathyroidism of renal origin: Secondary | ICD-10-CM | POA: Diagnosis not present

## 2018-03-13 DIAGNOSIS — E876 Hypokalemia: Secondary | ICD-10-CM | POA: Diagnosis not present

## 2018-03-13 DIAGNOSIS — N186 End stage renal disease: Secondary | ICD-10-CM | POA: Diagnosis not present

## 2018-03-15 DIAGNOSIS — E876 Hypokalemia: Secondary | ICD-10-CM | POA: Diagnosis not present

## 2018-03-15 DIAGNOSIS — N186 End stage renal disease: Secondary | ICD-10-CM | POA: Diagnosis not present

## 2018-03-15 DIAGNOSIS — N2581 Secondary hyperparathyroidism of renal origin: Secondary | ICD-10-CM | POA: Diagnosis not present

## 2018-03-18 DIAGNOSIS — N186 End stage renal disease: Secondary | ICD-10-CM | POA: Diagnosis not present

## 2018-03-18 DIAGNOSIS — E876 Hypokalemia: Secondary | ICD-10-CM | POA: Diagnosis not present

## 2018-03-18 DIAGNOSIS — N2581 Secondary hyperparathyroidism of renal origin: Secondary | ICD-10-CM | POA: Diagnosis not present

## 2018-03-21 DIAGNOSIS — N186 End stage renal disease: Secondary | ICD-10-CM | POA: Diagnosis not present

## 2018-03-21 DIAGNOSIS — N2581 Secondary hyperparathyroidism of renal origin: Secondary | ICD-10-CM | POA: Diagnosis not present

## 2018-03-21 DIAGNOSIS — E876 Hypokalemia: Secondary | ICD-10-CM | POA: Diagnosis not present

## 2018-03-23 DIAGNOSIS — N2581 Secondary hyperparathyroidism of renal origin: Secondary | ICD-10-CM | POA: Diagnosis not present

## 2018-03-23 DIAGNOSIS — N186 End stage renal disease: Secondary | ICD-10-CM | POA: Diagnosis not present

## 2018-03-23 DIAGNOSIS — E876 Hypokalemia: Secondary | ICD-10-CM | POA: Diagnosis not present

## 2018-03-24 DIAGNOSIS — T8612 Kidney transplant failure: Secondary | ICD-10-CM | POA: Diagnosis not present

## 2018-03-24 DIAGNOSIS — N186 End stage renal disease: Secondary | ICD-10-CM | POA: Diagnosis not present

## 2018-03-24 DIAGNOSIS — Z992 Dependence on renal dialysis: Secondary | ICD-10-CM | POA: Diagnosis not present

## 2018-03-25 DIAGNOSIS — D631 Anemia in chronic kidney disease: Secondary | ICD-10-CM | POA: Diagnosis not present

## 2018-03-25 DIAGNOSIS — N2581 Secondary hyperparathyroidism of renal origin: Secondary | ICD-10-CM | POA: Diagnosis not present

## 2018-03-25 DIAGNOSIS — E876 Hypokalemia: Secondary | ICD-10-CM | POA: Diagnosis not present

## 2018-03-25 DIAGNOSIS — N186 End stage renal disease: Secondary | ICD-10-CM | POA: Diagnosis not present

## 2018-03-27 DIAGNOSIS — D631 Anemia in chronic kidney disease: Secondary | ICD-10-CM | POA: Diagnosis not present

## 2018-03-27 DIAGNOSIS — N186 End stage renal disease: Secondary | ICD-10-CM | POA: Diagnosis not present

## 2018-03-27 DIAGNOSIS — N2581 Secondary hyperparathyroidism of renal origin: Secondary | ICD-10-CM | POA: Diagnosis not present

## 2018-03-27 DIAGNOSIS — E876 Hypokalemia: Secondary | ICD-10-CM | POA: Diagnosis not present

## 2018-03-28 ENCOUNTER — Other Ambulatory Visit: Payer: Self-pay | Admitting: Family Medicine

## 2018-03-30 DIAGNOSIS — N186 End stage renal disease: Secondary | ICD-10-CM | POA: Diagnosis not present

## 2018-03-30 DIAGNOSIS — D631 Anemia in chronic kidney disease: Secondary | ICD-10-CM | POA: Diagnosis not present

## 2018-03-30 DIAGNOSIS — E876 Hypokalemia: Secondary | ICD-10-CM | POA: Diagnosis not present

## 2018-03-30 DIAGNOSIS — N2581 Secondary hyperparathyroidism of renal origin: Secondary | ICD-10-CM | POA: Diagnosis not present

## 2018-04-01 DIAGNOSIS — E876 Hypokalemia: Secondary | ICD-10-CM | POA: Diagnosis not present

## 2018-04-01 DIAGNOSIS — N186 End stage renal disease: Secondary | ICD-10-CM | POA: Diagnosis not present

## 2018-04-01 DIAGNOSIS — N2581 Secondary hyperparathyroidism of renal origin: Secondary | ICD-10-CM | POA: Diagnosis not present

## 2018-04-01 DIAGNOSIS — D631 Anemia in chronic kidney disease: Secondary | ICD-10-CM | POA: Diagnosis not present

## 2018-04-03 DIAGNOSIS — E876 Hypokalemia: Secondary | ICD-10-CM | POA: Diagnosis not present

## 2018-04-03 DIAGNOSIS — N2581 Secondary hyperparathyroidism of renal origin: Secondary | ICD-10-CM | POA: Diagnosis not present

## 2018-04-03 DIAGNOSIS — N186 End stage renal disease: Secondary | ICD-10-CM | POA: Diagnosis not present

## 2018-04-03 DIAGNOSIS — D631 Anemia in chronic kidney disease: Secondary | ICD-10-CM | POA: Diagnosis not present

## 2018-04-06 DIAGNOSIS — N2581 Secondary hyperparathyroidism of renal origin: Secondary | ICD-10-CM | POA: Diagnosis not present

## 2018-04-06 DIAGNOSIS — D631 Anemia in chronic kidney disease: Secondary | ICD-10-CM | POA: Diagnosis not present

## 2018-04-06 DIAGNOSIS — E876 Hypokalemia: Secondary | ICD-10-CM | POA: Diagnosis not present

## 2018-04-06 DIAGNOSIS — N186 End stage renal disease: Secondary | ICD-10-CM | POA: Diagnosis not present

## 2018-04-07 ENCOUNTER — Ambulatory Visit: Payer: Medicare Other | Admitting: Family Medicine

## 2018-04-07 DIAGNOSIS — Z0289 Encounter for other administrative examinations: Secondary | ICD-10-CM

## 2018-04-08 DIAGNOSIS — D631 Anemia in chronic kidney disease: Secondary | ICD-10-CM | POA: Diagnosis not present

## 2018-04-08 DIAGNOSIS — N186 End stage renal disease: Secondary | ICD-10-CM | POA: Diagnosis not present

## 2018-04-08 DIAGNOSIS — E876 Hypokalemia: Secondary | ICD-10-CM | POA: Diagnosis not present

## 2018-04-08 DIAGNOSIS — N2581 Secondary hyperparathyroidism of renal origin: Secondary | ICD-10-CM | POA: Diagnosis not present

## 2018-04-09 ENCOUNTER — Ambulatory Visit: Payer: Medicare Other | Admitting: Family Medicine

## 2018-04-09 ENCOUNTER — Telehealth: Payer: Self-pay | Admitting: Family Medicine

## 2018-04-09 NOTE — Telephone Encounter (Signed)
Patient arrived 20 minutes late for apt on 04/09/18. I skyped Jen Zellmer and asked if he could be checked in or needed to reschedule and she replied that the pt would need to reschedule. I informed the pt and offered to reschedule for next week and he responded that he was tired and would call us later to reschedule.

## 2018-04-10 DIAGNOSIS — N2581 Secondary hyperparathyroidism of renal origin: Secondary | ICD-10-CM | POA: Diagnosis not present

## 2018-04-10 DIAGNOSIS — D631 Anemia in chronic kidney disease: Secondary | ICD-10-CM | POA: Diagnosis not present

## 2018-04-10 DIAGNOSIS — N186 End stage renal disease: Secondary | ICD-10-CM | POA: Diagnosis not present

## 2018-04-10 DIAGNOSIS — E876 Hypokalemia: Secondary | ICD-10-CM | POA: Diagnosis not present

## 2018-04-11 ENCOUNTER — Other Ambulatory Visit: Payer: Self-pay | Admitting: Cardiology

## 2018-04-12 ENCOUNTER — Other Ambulatory Visit: Payer: Self-pay | Admitting: *Deleted

## 2018-04-12 DIAGNOSIS — I10 Essential (primary) hypertension: Secondary | ICD-10-CM

## 2018-04-12 MED ORDER — LOSARTAN POTASSIUM 50 MG PO TABS: 50.0000 mg | ORAL_TABLET | Freq: Every day | ORAL | 1 refills | Status: DC

## 2018-04-13 DIAGNOSIS — N2581 Secondary hyperparathyroidism of renal origin: Secondary | ICD-10-CM | POA: Diagnosis not present

## 2018-04-13 DIAGNOSIS — D631 Anemia in chronic kidney disease: Secondary | ICD-10-CM | POA: Diagnosis not present

## 2018-04-13 DIAGNOSIS — E876 Hypokalemia: Secondary | ICD-10-CM | POA: Diagnosis not present

## 2018-04-13 DIAGNOSIS — N186 End stage renal disease: Secondary | ICD-10-CM | POA: Diagnosis not present

## 2018-04-14 ENCOUNTER — Encounter: Payer: Self-pay | Admitting: Family Medicine

## 2018-04-15 DIAGNOSIS — D631 Anemia in chronic kidney disease: Secondary | ICD-10-CM | POA: Diagnosis not present

## 2018-04-15 DIAGNOSIS — E876 Hypokalemia: Secondary | ICD-10-CM | POA: Diagnosis not present

## 2018-04-15 DIAGNOSIS — N2581 Secondary hyperparathyroidism of renal origin: Secondary | ICD-10-CM | POA: Diagnosis not present

## 2018-04-15 DIAGNOSIS — N186 End stage renal disease: Secondary | ICD-10-CM | POA: Diagnosis not present

## 2018-04-17 DIAGNOSIS — N2581 Secondary hyperparathyroidism of renal origin: Secondary | ICD-10-CM | POA: Diagnosis not present

## 2018-04-17 DIAGNOSIS — N186 End stage renal disease: Secondary | ICD-10-CM | POA: Diagnosis not present

## 2018-04-17 DIAGNOSIS — E876 Hypokalemia: Secondary | ICD-10-CM | POA: Diagnosis not present

## 2018-04-17 DIAGNOSIS — D631 Anemia in chronic kidney disease: Secondary | ICD-10-CM | POA: Diagnosis not present

## 2018-04-20 DIAGNOSIS — E876 Hypokalemia: Secondary | ICD-10-CM | POA: Diagnosis not present

## 2018-04-20 DIAGNOSIS — N186 End stage renal disease: Secondary | ICD-10-CM | POA: Diagnosis not present

## 2018-04-20 DIAGNOSIS — D631 Anemia in chronic kidney disease: Secondary | ICD-10-CM | POA: Diagnosis not present

## 2018-04-20 DIAGNOSIS — N2581 Secondary hyperparathyroidism of renal origin: Secondary | ICD-10-CM | POA: Diagnosis not present

## 2018-04-22 DIAGNOSIS — N2581 Secondary hyperparathyroidism of renal origin: Secondary | ICD-10-CM | POA: Diagnosis not present

## 2018-04-22 DIAGNOSIS — E876 Hypokalemia: Secondary | ICD-10-CM | POA: Diagnosis not present

## 2018-04-22 DIAGNOSIS — D631 Anemia in chronic kidney disease: Secondary | ICD-10-CM | POA: Diagnosis not present

## 2018-04-22 DIAGNOSIS — N186 End stage renal disease: Secondary | ICD-10-CM | POA: Diagnosis not present

## 2018-04-24 DIAGNOSIS — N2581 Secondary hyperparathyroidism of renal origin: Secondary | ICD-10-CM | POA: Diagnosis not present

## 2018-04-24 DIAGNOSIS — E876 Hypokalemia: Secondary | ICD-10-CM | POA: Diagnosis not present

## 2018-04-24 DIAGNOSIS — Z992 Dependence on renal dialysis: Secondary | ICD-10-CM | POA: Diagnosis not present

## 2018-04-24 DIAGNOSIS — D509 Iron deficiency anemia, unspecified: Secondary | ICD-10-CM | POA: Diagnosis not present

## 2018-04-24 DIAGNOSIS — N186 End stage renal disease: Secondary | ICD-10-CM | POA: Diagnosis not present

## 2018-04-24 DIAGNOSIS — T8612 Kidney transplant failure: Secondary | ICD-10-CM | POA: Diagnosis not present

## 2018-04-24 DIAGNOSIS — D631 Anemia in chronic kidney disease: Secondary | ICD-10-CM | POA: Diagnosis not present

## 2018-04-27 DIAGNOSIS — N186 End stage renal disease: Secondary | ICD-10-CM | POA: Diagnosis not present

## 2018-04-27 DIAGNOSIS — E876 Hypokalemia: Secondary | ICD-10-CM | POA: Diagnosis not present

## 2018-04-27 DIAGNOSIS — N2581 Secondary hyperparathyroidism of renal origin: Secondary | ICD-10-CM | POA: Diagnosis not present

## 2018-04-27 DIAGNOSIS — D631 Anemia in chronic kidney disease: Secondary | ICD-10-CM | POA: Diagnosis not present

## 2018-04-27 DIAGNOSIS — D509 Iron deficiency anemia, unspecified: Secondary | ICD-10-CM | POA: Diagnosis not present

## 2018-04-29 DIAGNOSIS — N186 End stage renal disease: Secondary | ICD-10-CM | POA: Diagnosis not present

## 2018-04-29 DIAGNOSIS — N2581 Secondary hyperparathyroidism of renal origin: Secondary | ICD-10-CM | POA: Diagnosis not present

## 2018-04-29 DIAGNOSIS — E876 Hypokalemia: Secondary | ICD-10-CM | POA: Diagnosis not present

## 2018-04-29 DIAGNOSIS — D509 Iron deficiency anemia, unspecified: Secondary | ICD-10-CM | POA: Diagnosis not present

## 2018-04-29 DIAGNOSIS — D631 Anemia in chronic kidney disease: Secondary | ICD-10-CM | POA: Diagnosis not present

## 2018-05-01 DIAGNOSIS — D509 Iron deficiency anemia, unspecified: Secondary | ICD-10-CM | POA: Diagnosis not present

## 2018-05-01 DIAGNOSIS — N186 End stage renal disease: Secondary | ICD-10-CM | POA: Diagnosis not present

## 2018-05-01 DIAGNOSIS — D631 Anemia in chronic kidney disease: Secondary | ICD-10-CM | POA: Diagnosis not present

## 2018-05-01 DIAGNOSIS — E876 Hypokalemia: Secondary | ICD-10-CM | POA: Diagnosis not present

## 2018-05-01 DIAGNOSIS — N2581 Secondary hyperparathyroidism of renal origin: Secondary | ICD-10-CM | POA: Diagnosis not present

## 2018-05-04 DIAGNOSIS — D509 Iron deficiency anemia, unspecified: Secondary | ICD-10-CM | POA: Diagnosis not present

## 2018-05-04 DIAGNOSIS — D631 Anemia in chronic kidney disease: Secondary | ICD-10-CM | POA: Diagnosis not present

## 2018-05-04 DIAGNOSIS — N2581 Secondary hyperparathyroidism of renal origin: Secondary | ICD-10-CM | POA: Diagnosis not present

## 2018-05-04 DIAGNOSIS — N186 End stage renal disease: Secondary | ICD-10-CM | POA: Diagnosis not present

## 2018-05-04 DIAGNOSIS — E876 Hypokalemia: Secondary | ICD-10-CM | POA: Diagnosis not present

## 2018-05-06 DIAGNOSIS — N186 End stage renal disease: Secondary | ICD-10-CM | POA: Diagnosis not present

## 2018-05-06 DIAGNOSIS — D509 Iron deficiency anemia, unspecified: Secondary | ICD-10-CM | POA: Diagnosis not present

## 2018-05-06 DIAGNOSIS — E876 Hypokalemia: Secondary | ICD-10-CM | POA: Diagnosis not present

## 2018-05-06 DIAGNOSIS — N2581 Secondary hyperparathyroidism of renal origin: Secondary | ICD-10-CM | POA: Diagnosis not present

## 2018-05-06 DIAGNOSIS — D631 Anemia in chronic kidney disease: Secondary | ICD-10-CM | POA: Diagnosis not present

## 2018-05-08 DIAGNOSIS — D631 Anemia in chronic kidney disease: Secondary | ICD-10-CM | POA: Diagnosis not present

## 2018-05-08 DIAGNOSIS — E876 Hypokalemia: Secondary | ICD-10-CM | POA: Diagnosis not present

## 2018-05-08 DIAGNOSIS — D509 Iron deficiency anemia, unspecified: Secondary | ICD-10-CM | POA: Diagnosis not present

## 2018-05-08 DIAGNOSIS — N186 End stage renal disease: Secondary | ICD-10-CM | POA: Diagnosis not present

## 2018-05-08 DIAGNOSIS — N2581 Secondary hyperparathyroidism of renal origin: Secondary | ICD-10-CM | POA: Diagnosis not present

## 2018-05-11 DIAGNOSIS — D509 Iron deficiency anemia, unspecified: Secondary | ICD-10-CM | POA: Diagnosis not present

## 2018-05-11 DIAGNOSIS — E876 Hypokalemia: Secondary | ICD-10-CM | POA: Diagnosis not present

## 2018-05-11 DIAGNOSIS — N2581 Secondary hyperparathyroidism of renal origin: Secondary | ICD-10-CM | POA: Diagnosis not present

## 2018-05-11 DIAGNOSIS — N186 End stage renal disease: Secondary | ICD-10-CM | POA: Diagnosis not present

## 2018-05-11 DIAGNOSIS — D631 Anemia in chronic kidney disease: Secondary | ICD-10-CM | POA: Diagnosis not present

## 2018-05-13 DIAGNOSIS — N2581 Secondary hyperparathyroidism of renal origin: Secondary | ICD-10-CM | POA: Diagnosis not present

## 2018-05-13 DIAGNOSIS — E876 Hypokalemia: Secondary | ICD-10-CM | POA: Diagnosis not present

## 2018-05-13 DIAGNOSIS — D631 Anemia in chronic kidney disease: Secondary | ICD-10-CM | POA: Diagnosis not present

## 2018-05-13 DIAGNOSIS — N186 End stage renal disease: Secondary | ICD-10-CM | POA: Diagnosis not present

## 2018-05-13 DIAGNOSIS — D509 Iron deficiency anemia, unspecified: Secondary | ICD-10-CM | POA: Diagnosis not present

## 2018-05-15 DIAGNOSIS — N2581 Secondary hyperparathyroidism of renal origin: Secondary | ICD-10-CM | POA: Diagnosis not present

## 2018-05-15 DIAGNOSIS — E876 Hypokalemia: Secondary | ICD-10-CM | POA: Diagnosis not present

## 2018-05-15 DIAGNOSIS — D509 Iron deficiency anemia, unspecified: Secondary | ICD-10-CM | POA: Diagnosis not present

## 2018-05-15 DIAGNOSIS — D631 Anemia in chronic kidney disease: Secondary | ICD-10-CM | POA: Diagnosis not present

## 2018-05-15 DIAGNOSIS — N186 End stage renal disease: Secondary | ICD-10-CM | POA: Diagnosis not present

## 2018-05-18 DIAGNOSIS — N2581 Secondary hyperparathyroidism of renal origin: Secondary | ICD-10-CM | POA: Diagnosis not present

## 2018-05-18 DIAGNOSIS — D509 Iron deficiency anemia, unspecified: Secondary | ICD-10-CM | POA: Diagnosis not present

## 2018-05-18 DIAGNOSIS — E876 Hypokalemia: Secondary | ICD-10-CM | POA: Diagnosis not present

## 2018-05-18 DIAGNOSIS — D631 Anemia in chronic kidney disease: Secondary | ICD-10-CM | POA: Diagnosis not present

## 2018-05-18 DIAGNOSIS — N186 End stage renal disease: Secondary | ICD-10-CM | POA: Diagnosis not present

## 2018-05-20 DIAGNOSIS — D631 Anemia in chronic kidney disease: Secondary | ICD-10-CM | POA: Diagnosis not present

## 2018-05-20 DIAGNOSIS — N186 End stage renal disease: Secondary | ICD-10-CM | POA: Diagnosis not present

## 2018-05-20 DIAGNOSIS — D509 Iron deficiency anemia, unspecified: Secondary | ICD-10-CM | POA: Diagnosis not present

## 2018-05-20 DIAGNOSIS — N2581 Secondary hyperparathyroidism of renal origin: Secondary | ICD-10-CM | POA: Diagnosis not present

## 2018-05-20 DIAGNOSIS — E876 Hypokalemia: Secondary | ICD-10-CM | POA: Diagnosis not present

## 2018-05-22 DIAGNOSIS — N2581 Secondary hyperparathyroidism of renal origin: Secondary | ICD-10-CM | POA: Diagnosis not present

## 2018-05-22 DIAGNOSIS — N186 End stage renal disease: Secondary | ICD-10-CM | POA: Diagnosis not present

## 2018-05-22 DIAGNOSIS — E876 Hypokalemia: Secondary | ICD-10-CM | POA: Diagnosis not present

## 2018-05-22 DIAGNOSIS — D631 Anemia in chronic kidney disease: Secondary | ICD-10-CM | POA: Diagnosis not present

## 2018-05-22 DIAGNOSIS — D509 Iron deficiency anemia, unspecified: Secondary | ICD-10-CM | POA: Diagnosis not present

## 2018-05-23 DIAGNOSIS — N186 End stage renal disease: Secondary | ICD-10-CM | POA: Diagnosis not present

## 2018-05-23 DIAGNOSIS — Z992 Dependence on renal dialysis: Secondary | ICD-10-CM | POA: Diagnosis not present

## 2018-05-23 DIAGNOSIS — T8612 Kidney transplant failure: Secondary | ICD-10-CM | POA: Diagnosis not present

## 2018-05-25 DIAGNOSIS — N2581 Secondary hyperparathyroidism of renal origin: Secondary | ICD-10-CM | POA: Diagnosis not present

## 2018-05-25 DIAGNOSIS — E876 Hypokalemia: Secondary | ICD-10-CM | POA: Diagnosis not present

## 2018-05-25 DIAGNOSIS — N186 End stage renal disease: Secondary | ICD-10-CM | POA: Diagnosis not present

## 2018-05-27 DIAGNOSIS — N186 End stage renal disease: Secondary | ICD-10-CM | POA: Diagnosis not present

## 2018-05-27 DIAGNOSIS — E876 Hypokalemia: Secondary | ICD-10-CM | POA: Diagnosis not present

## 2018-05-27 DIAGNOSIS — N2581 Secondary hyperparathyroidism of renal origin: Secondary | ICD-10-CM | POA: Diagnosis not present

## 2018-05-28 ENCOUNTER — Other Ambulatory Visit: Payer: Self-pay | Admitting: Family Medicine

## 2018-05-29 DIAGNOSIS — N186 End stage renal disease: Secondary | ICD-10-CM | POA: Diagnosis not present

## 2018-05-29 DIAGNOSIS — N2581 Secondary hyperparathyroidism of renal origin: Secondary | ICD-10-CM | POA: Diagnosis not present

## 2018-05-29 DIAGNOSIS — E876 Hypokalemia: Secondary | ICD-10-CM | POA: Diagnosis not present

## 2018-06-01 DIAGNOSIS — N2581 Secondary hyperparathyroidism of renal origin: Secondary | ICD-10-CM | POA: Diagnosis not present

## 2018-06-01 DIAGNOSIS — N186 End stage renal disease: Secondary | ICD-10-CM | POA: Diagnosis not present

## 2018-06-01 DIAGNOSIS — E876 Hypokalemia: Secondary | ICD-10-CM | POA: Diagnosis not present

## 2018-06-03 DIAGNOSIS — E876 Hypokalemia: Secondary | ICD-10-CM | POA: Diagnosis not present

## 2018-06-03 DIAGNOSIS — N2581 Secondary hyperparathyroidism of renal origin: Secondary | ICD-10-CM | POA: Diagnosis not present

## 2018-06-03 DIAGNOSIS — N186 End stage renal disease: Secondary | ICD-10-CM | POA: Diagnosis not present

## 2018-06-05 DIAGNOSIS — E876 Hypokalemia: Secondary | ICD-10-CM | POA: Diagnosis not present

## 2018-06-05 DIAGNOSIS — N2581 Secondary hyperparathyroidism of renal origin: Secondary | ICD-10-CM | POA: Diagnosis not present

## 2018-06-05 DIAGNOSIS — N186 End stage renal disease: Secondary | ICD-10-CM | POA: Diagnosis not present

## 2018-06-08 DIAGNOSIS — N186 End stage renal disease: Secondary | ICD-10-CM | POA: Diagnosis not present

## 2018-06-08 DIAGNOSIS — N2581 Secondary hyperparathyroidism of renal origin: Secondary | ICD-10-CM | POA: Diagnosis not present

## 2018-06-08 DIAGNOSIS — E876 Hypokalemia: Secondary | ICD-10-CM | POA: Diagnosis not present

## 2018-06-09 NOTE — Telephone Encounter (Signed)
Last OV 10/05/2017 Last refill 12/02/2017 #90/3 Next OV not scheduled  Requesting refill too soon.

## 2018-06-10 DIAGNOSIS — E876 Hypokalemia: Secondary | ICD-10-CM | POA: Diagnosis not present

## 2018-06-10 DIAGNOSIS — N186 End stage renal disease: Secondary | ICD-10-CM | POA: Diagnosis not present

## 2018-06-10 DIAGNOSIS — N2581 Secondary hyperparathyroidism of renal origin: Secondary | ICD-10-CM | POA: Diagnosis not present

## 2018-06-12 DIAGNOSIS — N186 End stage renal disease: Secondary | ICD-10-CM | POA: Diagnosis not present

## 2018-06-12 DIAGNOSIS — N2581 Secondary hyperparathyroidism of renal origin: Secondary | ICD-10-CM | POA: Diagnosis not present

## 2018-06-12 DIAGNOSIS — E876 Hypokalemia: Secondary | ICD-10-CM | POA: Diagnosis not present

## 2018-06-15 DIAGNOSIS — E876 Hypokalemia: Secondary | ICD-10-CM | POA: Diagnosis not present

## 2018-06-15 DIAGNOSIS — N2581 Secondary hyperparathyroidism of renal origin: Secondary | ICD-10-CM | POA: Diagnosis not present

## 2018-06-15 DIAGNOSIS — N186 End stage renal disease: Secondary | ICD-10-CM | POA: Diagnosis not present

## 2018-06-16 ENCOUNTER — Telehealth (HOSPITAL_COMMUNITY): Payer: Self-pay | Admitting: Rehabilitation

## 2018-06-16 ENCOUNTER — Other Ambulatory Visit: Payer: Self-pay

## 2018-06-16 DIAGNOSIS — N186 End stage renal disease: Secondary | ICD-10-CM

## 2018-06-16 DIAGNOSIS — Z992 Dependence on renal dialysis: Principal | ICD-10-CM

## 2018-06-16 NOTE — Telephone Encounter (Signed)
The above patient or their representative was contacted and gave the following answers to these questions:         Do you have any of the following symptoms?  Fever                    Cough                   Shortness of breath  Do  you have any of the following other symptoms?    muscle pain         vomiting,        diarrhea        rash         weakness        red eye        abdominal pain         bruising          bruising or bleeding              joint pain           severe headache    Have you been in contact with someone who was or has been sick in the past 2 weeks?  Yes                 Unsure                         Unable to assess   Does the person that you were in contact with have any of the following symptoms?   Cough         shortness of breath           muscle pain         vomiting,            diarrhea            rash            weakness           fever            red eye           abdominal pain           bruising  or  bleeding                joint pain                severe headache               Have you  or someone you have been in contact with traveled internationally in th last month?         If yes, which countries?   Have you  or someone you have been in contact with traveled outside [place] in th last month?         If yes, which state and city?   COMMENTS OR ACTION PLAN FOR THIS PATIENT:

## 2018-06-17 ENCOUNTER — Other Ambulatory Visit (HOSPITAL_COMMUNITY): Payer: Medicare Other

## 2018-06-17 ENCOUNTER — Ambulatory Visit: Payer: Medicare Other | Admitting: Vascular Surgery

## 2018-06-17 ENCOUNTER — Encounter: Payer: Self-pay | Admitting: Family

## 2018-06-17 ENCOUNTER — Ambulatory Visit (HOSPITAL_COMMUNITY): Payer: Medicare Other

## 2018-06-17 DIAGNOSIS — E876 Hypokalemia: Secondary | ICD-10-CM | POA: Diagnosis not present

## 2018-06-17 DIAGNOSIS — N186 End stage renal disease: Secondary | ICD-10-CM | POA: Diagnosis not present

## 2018-06-17 DIAGNOSIS — N2581 Secondary hyperparathyroidism of renal origin: Secondary | ICD-10-CM | POA: Diagnosis not present

## 2018-06-19 DIAGNOSIS — E876 Hypokalemia: Secondary | ICD-10-CM | POA: Diagnosis not present

## 2018-06-19 DIAGNOSIS — N186 End stage renal disease: Secondary | ICD-10-CM | POA: Diagnosis not present

## 2018-06-19 DIAGNOSIS — N2581 Secondary hyperparathyroidism of renal origin: Secondary | ICD-10-CM | POA: Diagnosis not present

## 2018-06-22 DIAGNOSIS — N2581 Secondary hyperparathyroidism of renal origin: Secondary | ICD-10-CM | POA: Diagnosis not present

## 2018-06-22 DIAGNOSIS — N186 End stage renal disease: Secondary | ICD-10-CM | POA: Diagnosis not present

## 2018-06-22 DIAGNOSIS — E876 Hypokalemia: Secondary | ICD-10-CM | POA: Diagnosis not present

## 2018-06-23 DIAGNOSIS — Z992 Dependence on renal dialysis: Secondary | ICD-10-CM | POA: Diagnosis not present

## 2018-06-23 DIAGNOSIS — N186 End stage renal disease: Secondary | ICD-10-CM | POA: Diagnosis not present

## 2018-06-23 DIAGNOSIS — T8612 Kidney transplant failure: Secondary | ICD-10-CM | POA: Diagnosis not present

## 2018-06-24 DIAGNOSIS — E876 Hypokalemia: Secondary | ICD-10-CM | POA: Diagnosis not present

## 2018-06-24 DIAGNOSIS — D631 Anemia in chronic kidney disease: Secondary | ICD-10-CM | POA: Diagnosis not present

## 2018-06-24 DIAGNOSIS — D509 Iron deficiency anemia, unspecified: Secondary | ICD-10-CM | POA: Diagnosis not present

## 2018-06-24 DIAGNOSIS — N2581 Secondary hyperparathyroidism of renal origin: Secondary | ICD-10-CM | POA: Diagnosis not present

## 2018-06-24 DIAGNOSIS — N186 End stage renal disease: Secondary | ICD-10-CM | POA: Diagnosis not present

## 2018-06-26 DIAGNOSIS — E876 Hypokalemia: Secondary | ICD-10-CM | POA: Diagnosis not present

## 2018-06-26 DIAGNOSIS — N186 End stage renal disease: Secondary | ICD-10-CM | POA: Diagnosis not present

## 2018-06-26 DIAGNOSIS — N2581 Secondary hyperparathyroidism of renal origin: Secondary | ICD-10-CM | POA: Diagnosis not present

## 2018-06-26 DIAGNOSIS — D509 Iron deficiency anemia, unspecified: Secondary | ICD-10-CM | POA: Diagnosis not present

## 2018-06-26 DIAGNOSIS — D631 Anemia in chronic kidney disease: Secondary | ICD-10-CM | POA: Diagnosis not present

## 2018-06-29 DIAGNOSIS — D631 Anemia in chronic kidney disease: Secondary | ICD-10-CM | POA: Diagnosis not present

## 2018-06-29 DIAGNOSIS — N2581 Secondary hyperparathyroidism of renal origin: Secondary | ICD-10-CM | POA: Diagnosis not present

## 2018-06-29 DIAGNOSIS — N186 End stage renal disease: Secondary | ICD-10-CM | POA: Diagnosis not present

## 2018-06-29 DIAGNOSIS — D509 Iron deficiency anemia, unspecified: Secondary | ICD-10-CM | POA: Diagnosis not present

## 2018-06-29 DIAGNOSIS — E876 Hypokalemia: Secondary | ICD-10-CM | POA: Diagnosis not present

## 2018-07-01 DIAGNOSIS — D509 Iron deficiency anemia, unspecified: Secondary | ICD-10-CM | POA: Diagnosis not present

## 2018-07-01 DIAGNOSIS — D631 Anemia in chronic kidney disease: Secondary | ICD-10-CM | POA: Diagnosis not present

## 2018-07-01 DIAGNOSIS — N2581 Secondary hyperparathyroidism of renal origin: Secondary | ICD-10-CM | POA: Diagnosis not present

## 2018-07-01 DIAGNOSIS — E876 Hypokalemia: Secondary | ICD-10-CM | POA: Diagnosis not present

## 2018-07-01 DIAGNOSIS — N186 End stage renal disease: Secondary | ICD-10-CM | POA: Diagnosis not present

## 2018-07-03 DIAGNOSIS — N2581 Secondary hyperparathyroidism of renal origin: Secondary | ICD-10-CM | POA: Diagnosis not present

## 2018-07-03 DIAGNOSIS — D509 Iron deficiency anemia, unspecified: Secondary | ICD-10-CM | POA: Diagnosis not present

## 2018-07-03 DIAGNOSIS — D631 Anemia in chronic kidney disease: Secondary | ICD-10-CM | POA: Diagnosis not present

## 2018-07-03 DIAGNOSIS — E876 Hypokalemia: Secondary | ICD-10-CM | POA: Diagnosis not present

## 2018-07-03 DIAGNOSIS — N186 End stage renal disease: Secondary | ICD-10-CM | POA: Diagnosis not present

## 2018-07-06 DIAGNOSIS — D631 Anemia in chronic kidney disease: Secondary | ICD-10-CM | POA: Diagnosis not present

## 2018-07-06 DIAGNOSIS — D509 Iron deficiency anemia, unspecified: Secondary | ICD-10-CM | POA: Diagnosis not present

## 2018-07-06 DIAGNOSIS — N2581 Secondary hyperparathyroidism of renal origin: Secondary | ICD-10-CM | POA: Diagnosis not present

## 2018-07-06 DIAGNOSIS — E876 Hypokalemia: Secondary | ICD-10-CM | POA: Diagnosis not present

## 2018-07-06 DIAGNOSIS — N186 End stage renal disease: Secondary | ICD-10-CM | POA: Diagnosis not present

## 2018-07-08 DIAGNOSIS — N2581 Secondary hyperparathyroidism of renal origin: Secondary | ICD-10-CM | POA: Diagnosis not present

## 2018-07-08 DIAGNOSIS — D631 Anemia in chronic kidney disease: Secondary | ICD-10-CM | POA: Diagnosis not present

## 2018-07-08 DIAGNOSIS — N186 End stage renal disease: Secondary | ICD-10-CM | POA: Diagnosis not present

## 2018-07-08 DIAGNOSIS — E876 Hypokalemia: Secondary | ICD-10-CM | POA: Diagnosis not present

## 2018-07-08 DIAGNOSIS — D509 Iron deficiency anemia, unspecified: Secondary | ICD-10-CM | POA: Diagnosis not present

## 2018-07-10 DIAGNOSIS — D509 Iron deficiency anemia, unspecified: Secondary | ICD-10-CM | POA: Diagnosis not present

## 2018-07-10 DIAGNOSIS — D631 Anemia in chronic kidney disease: Secondary | ICD-10-CM | POA: Diagnosis not present

## 2018-07-10 DIAGNOSIS — E876 Hypokalemia: Secondary | ICD-10-CM | POA: Diagnosis not present

## 2018-07-10 DIAGNOSIS — N2581 Secondary hyperparathyroidism of renal origin: Secondary | ICD-10-CM | POA: Diagnosis not present

## 2018-07-10 DIAGNOSIS — N186 End stage renal disease: Secondary | ICD-10-CM | POA: Diagnosis not present

## 2018-07-13 DIAGNOSIS — N2581 Secondary hyperparathyroidism of renal origin: Secondary | ICD-10-CM | POA: Diagnosis not present

## 2018-07-13 DIAGNOSIS — N186 End stage renal disease: Secondary | ICD-10-CM | POA: Diagnosis not present

## 2018-07-13 DIAGNOSIS — D509 Iron deficiency anemia, unspecified: Secondary | ICD-10-CM | POA: Diagnosis not present

## 2018-07-13 DIAGNOSIS — E876 Hypokalemia: Secondary | ICD-10-CM | POA: Diagnosis not present

## 2018-07-13 DIAGNOSIS — D631 Anemia in chronic kidney disease: Secondary | ICD-10-CM | POA: Diagnosis not present

## 2018-07-15 DIAGNOSIS — N186 End stage renal disease: Secondary | ICD-10-CM | POA: Diagnosis not present

## 2018-07-15 DIAGNOSIS — D631 Anemia in chronic kidney disease: Secondary | ICD-10-CM | POA: Diagnosis not present

## 2018-07-15 DIAGNOSIS — N2581 Secondary hyperparathyroidism of renal origin: Secondary | ICD-10-CM | POA: Diagnosis not present

## 2018-07-15 DIAGNOSIS — E876 Hypokalemia: Secondary | ICD-10-CM | POA: Diagnosis not present

## 2018-07-15 DIAGNOSIS — D509 Iron deficiency anemia, unspecified: Secondary | ICD-10-CM | POA: Diagnosis not present

## 2018-07-17 ENCOUNTER — Other Ambulatory Visit: Payer: Self-pay | Admitting: Pulmonary Disease

## 2018-07-17 DIAGNOSIS — E876 Hypokalemia: Secondary | ICD-10-CM | POA: Diagnosis not present

## 2018-07-17 DIAGNOSIS — N186 End stage renal disease: Secondary | ICD-10-CM | POA: Diagnosis not present

## 2018-07-17 DIAGNOSIS — D509 Iron deficiency anemia, unspecified: Secondary | ICD-10-CM | POA: Diagnosis not present

## 2018-07-17 DIAGNOSIS — D631 Anemia in chronic kidney disease: Secondary | ICD-10-CM | POA: Diagnosis not present

## 2018-07-17 DIAGNOSIS — N2581 Secondary hyperparathyroidism of renal origin: Secondary | ICD-10-CM | POA: Diagnosis not present

## 2018-07-20 ENCOUNTER — Telehealth: Payer: Self-pay | Admitting: *Deleted

## 2018-07-20 DIAGNOSIS — N186 End stage renal disease: Secondary | ICD-10-CM | POA: Diagnosis not present

## 2018-07-20 DIAGNOSIS — E876 Hypokalemia: Secondary | ICD-10-CM | POA: Diagnosis not present

## 2018-07-20 DIAGNOSIS — N2581 Secondary hyperparathyroidism of renal origin: Secondary | ICD-10-CM | POA: Diagnosis not present

## 2018-07-20 DIAGNOSIS — D509 Iron deficiency anemia, unspecified: Secondary | ICD-10-CM | POA: Diagnosis not present

## 2018-07-20 DIAGNOSIS — D631 Anemia in chronic kidney disease: Secondary | ICD-10-CM | POA: Diagnosis not present

## 2018-07-20 NOTE — Telephone Encounter (Signed)
Left message on voicemail to call office. Need to schedule Annual Wellness visit with Dr. Rogers Blocker.

## 2018-07-22 DIAGNOSIS — D631 Anemia in chronic kidney disease: Secondary | ICD-10-CM | POA: Diagnosis not present

## 2018-07-22 DIAGNOSIS — E876 Hypokalemia: Secondary | ICD-10-CM | POA: Diagnosis not present

## 2018-07-22 DIAGNOSIS — D509 Iron deficiency anemia, unspecified: Secondary | ICD-10-CM | POA: Diagnosis not present

## 2018-07-22 DIAGNOSIS — N2581 Secondary hyperparathyroidism of renal origin: Secondary | ICD-10-CM | POA: Diagnosis not present

## 2018-07-22 DIAGNOSIS — N186 End stage renal disease: Secondary | ICD-10-CM | POA: Diagnosis not present

## 2018-07-23 DIAGNOSIS — Z992 Dependence on renal dialysis: Secondary | ICD-10-CM | POA: Diagnosis not present

## 2018-07-23 DIAGNOSIS — T8612 Kidney transplant failure: Secondary | ICD-10-CM | POA: Diagnosis not present

## 2018-07-23 DIAGNOSIS — N186 End stage renal disease: Secondary | ICD-10-CM | POA: Diagnosis not present

## 2018-07-24 DIAGNOSIS — N186 End stage renal disease: Secondary | ICD-10-CM | POA: Diagnosis not present

## 2018-07-24 DIAGNOSIS — N2581 Secondary hyperparathyroidism of renal origin: Secondary | ICD-10-CM | POA: Diagnosis not present

## 2018-07-24 DIAGNOSIS — D631 Anemia in chronic kidney disease: Secondary | ICD-10-CM | POA: Diagnosis not present

## 2018-07-24 DIAGNOSIS — E876 Hypokalemia: Secondary | ICD-10-CM | POA: Diagnosis not present

## 2018-07-27 DIAGNOSIS — D631 Anemia in chronic kidney disease: Secondary | ICD-10-CM | POA: Diagnosis not present

## 2018-07-27 DIAGNOSIS — E876 Hypokalemia: Secondary | ICD-10-CM | POA: Diagnosis not present

## 2018-07-27 DIAGNOSIS — N186 End stage renal disease: Secondary | ICD-10-CM | POA: Diagnosis not present

## 2018-07-27 DIAGNOSIS — N2581 Secondary hyperparathyroidism of renal origin: Secondary | ICD-10-CM | POA: Diagnosis not present

## 2018-07-29 DIAGNOSIS — N186 End stage renal disease: Secondary | ICD-10-CM | POA: Diagnosis not present

## 2018-07-29 DIAGNOSIS — D631 Anemia in chronic kidney disease: Secondary | ICD-10-CM | POA: Diagnosis not present

## 2018-07-29 DIAGNOSIS — N2581 Secondary hyperparathyroidism of renal origin: Secondary | ICD-10-CM | POA: Diagnosis not present

## 2018-07-29 DIAGNOSIS — E876 Hypokalemia: Secondary | ICD-10-CM | POA: Diagnosis not present

## 2018-07-31 DIAGNOSIS — N186 End stage renal disease: Secondary | ICD-10-CM | POA: Diagnosis not present

## 2018-07-31 DIAGNOSIS — E876 Hypokalemia: Secondary | ICD-10-CM | POA: Diagnosis not present

## 2018-07-31 DIAGNOSIS — N2581 Secondary hyperparathyroidism of renal origin: Secondary | ICD-10-CM | POA: Diagnosis not present

## 2018-07-31 DIAGNOSIS — D631 Anemia in chronic kidney disease: Secondary | ICD-10-CM | POA: Diagnosis not present

## 2018-08-03 DIAGNOSIS — N186 End stage renal disease: Secondary | ICD-10-CM | POA: Diagnosis not present

## 2018-08-03 DIAGNOSIS — E876 Hypokalemia: Secondary | ICD-10-CM | POA: Diagnosis not present

## 2018-08-03 DIAGNOSIS — D631 Anemia in chronic kidney disease: Secondary | ICD-10-CM | POA: Diagnosis not present

## 2018-08-03 DIAGNOSIS — N2581 Secondary hyperparathyroidism of renal origin: Secondary | ICD-10-CM | POA: Diagnosis not present

## 2018-08-05 DIAGNOSIS — N2581 Secondary hyperparathyroidism of renal origin: Secondary | ICD-10-CM | POA: Diagnosis not present

## 2018-08-05 DIAGNOSIS — E876 Hypokalemia: Secondary | ICD-10-CM | POA: Diagnosis not present

## 2018-08-05 DIAGNOSIS — N186 End stage renal disease: Secondary | ICD-10-CM | POA: Diagnosis not present

## 2018-08-05 DIAGNOSIS — D631 Anemia in chronic kidney disease: Secondary | ICD-10-CM | POA: Diagnosis not present

## 2018-08-07 DIAGNOSIS — N2581 Secondary hyperparathyroidism of renal origin: Secondary | ICD-10-CM | POA: Diagnosis not present

## 2018-08-07 DIAGNOSIS — D631 Anemia in chronic kidney disease: Secondary | ICD-10-CM | POA: Diagnosis not present

## 2018-08-07 DIAGNOSIS — N186 End stage renal disease: Secondary | ICD-10-CM | POA: Diagnosis not present

## 2018-08-07 DIAGNOSIS — E876 Hypokalemia: Secondary | ICD-10-CM | POA: Diagnosis not present

## 2018-08-10 ENCOUNTER — Other Ambulatory Visit: Payer: Self-pay | Admitting: Pulmonary Disease

## 2018-08-10 DIAGNOSIS — N186 End stage renal disease: Secondary | ICD-10-CM | POA: Diagnosis not present

## 2018-08-10 DIAGNOSIS — N2581 Secondary hyperparathyroidism of renal origin: Secondary | ICD-10-CM | POA: Diagnosis not present

## 2018-08-10 DIAGNOSIS — E876 Hypokalemia: Secondary | ICD-10-CM | POA: Diagnosis not present

## 2018-08-10 DIAGNOSIS — D631 Anemia in chronic kidney disease: Secondary | ICD-10-CM | POA: Diagnosis not present

## 2018-08-12 ENCOUNTER — Other Ambulatory Visit: Payer: Self-pay | Admitting: Pulmonary Disease

## 2018-08-12 DIAGNOSIS — D631 Anemia in chronic kidney disease: Secondary | ICD-10-CM | POA: Diagnosis not present

## 2018-08-12 DIAGNOSIS — N186 End stage renal disease: Secondary | ICD-10-CM | POA: Diagnosis not present

## 2018-08-12 DIAGNOSIS — E876 Hypokalemia: Secondary | ICD-10-CM | POA: Diagnosis not present

## 2018-08-12 DIAGNOSIS — N2581 Secondary hyperparathyroidism of renal origin: Secondary | ICD-10-CM | POA: Diagnosis not present

## 2018-08-14 DIAGNOSIS — E876 Hypokalemia: Secondary | ICD-10-CM | POA: Diagnosis not present

## 2018-08-14 DIAGNOSIS — D631 Anemia in chronic kidney disease: Secondary | ICD-10-CM | POA: Diagnosis not present

## 2018-08-14 DIAGNOSIS — N186 End stage renal disease: Secondary | ICD-10-CM | POA: Diagnosis not present

## 2018-08-14 DIAGNOSIS — N2581 Secondary hyperparathyroidism of renal origin: Secondary | ICD-10-CM | POA: Diagnosis not present

## 2018-08-17 DIAGNOSIS — N2581 Secondary hyperparathyroidism of renal origin: Secondary | ICD-10-CM | POA: Diagnosis not present

## 2018-08-17 DIAGNOSIS — N186 End stage renal disease: Secondary | ICD-10-CM | POA: Diagnosis not present

## 2018-08-17 DIAGNOSIS — E876 Hypokalemia: Secondary | ICD-10-CM | POA: Diagnosis not present

## 2018-08-17 DIAGNOSIS — D631 Anemia in chronic kidney disease: Secondary | ICD-10-CM | POA: Diagnosis not present

## 2018-08-19 DIAGNOSIS — E876 Hypokalemia: Secondary | ICD-10-CM | POA: Diagnosis not present

## 2018-08-19 DIAGNOSIS — N2581 Secondary hyperparathyroidism of renal origin: Secondary | ICD-10-CM | POA: Diagnosis not present

## 2018-08-19 DIAGNOSIS — D631 Anemia in chronic kidney disease: Secondary | ICD-10-CM | POA: Diagnosis not present

## 2018-08-19 DIAGNOSIS — N186 End stage renal disease: Secondary | ICD-10-CM | POA: Diagnosis not present

## 2018-08-21 DIAGNOSIS — E876 Hypokalemia: Secondary | ICD-10-CM | POA: Diagnosis not present

## 2018-08-21 DIAGNOSIS — N2581 Secondary hyperparathyroidism of renal origin: Secondary | ICD-10-CM | POA: Diagnosis not present

## 2018-08-21 DIAGNOSIS — N186 End stage renal disease: Secondary | ICD-10-CM | POA: Diagnosis not present

## 2018-08-21 DIAGNOSIS — D631 Anemia in chronic kidney disease: Secondary | ICD-10-CM | POA: Diagnosis not present

## 2018-08-23 DIAGNOSIS — N186 End stage renal disease: Secondary | ICD-10-CM | POA: Diagnosis not present

## 2018-08-23 DIAGNOSIS — Z992 Dependence on renal dialysis: Secondary | ICD-10-CM | POA: Diagnosis not present

## 2018-08-23 DIAGNOSIS — T8612 Kidney transplant failure: Secondary | ICD-10-CM | POA: Diagnosis not present

## 2018-08-24 DIAGNOSIS — D631 Anemia in chronic kidney disease: Secondary | ICD-10-CM | POA: Diagnosis not present

## 2018-08-24 DIAGNOSIS — N186 End stage renal disease: Secondary | ICD-10-CM | POA: Diagnosis not present

## 2018-08-24 DIAGNOSIS — N2581 Secondary hyperparathyroidism of renal origin: Secondary | ICD-10-CM | POA: Diagnosis not present

## 2018-08-24 DIAGNOSIS — E876 Hypokalemia: Secondary | ICD-10-CM | POA: Diagnosis not present

## 2018-08-26 DIAGNOSIS — N186 End stage renal disease: Secondary | ICD-10-CM | POA: Diagnosis not present

## 2018-08-26 DIAGNOSIS — N2581 Secondary hyperparathyroidism of renal origin: Secondary | ICD-10-CM | POA: Diagnosis not present

## 2018-08-26 DIAGNOSIS — D631 Anemia in chronic kidney disease: Secondary | ICD-10-CM | POA: Diagnosis not present

## 2018-08-26 DIAGNOSIS — E876 Hypokalemia: Secondary | ICD-10-CM | POA: Diagnosis not present

## 2018-08-28 DIAGNOSIS — E876 Hypokalemia: Secondary | ICD-10-CM | POA: Diagnosis not present

## 2018-08-28 DIAGNOSIS — N2581 Secondary hyperparathyroidism of renal origin: Secondary | ICD-10-CM | POA: Diagnosis not present

## 2018-08-28 DIAGNOSIS — D631 Anemia in chronic kidney disease: Secondary | ICD-10-CM | POA: Diagnosis not present

## 2018-08-28 DIAGNOSIS — N186 End stage renal disease: Secondary | ICD-10-CM | POA: Diagnosis not present

## 2018-08-31 DIAGNOSIS — N186 End stage renal disease: Secondary | ICD-10-CM | POA: Diagnosis not present

## 2018-08-31 DIAGNOSIS — D631 Anemia in chronic kidney disease: Secondary | ICD-10-CM | POA: Diagnosis not present

## 2018-08-31 DIAGNOSIS — E876 Hypokalemia: Secondary | ICD-10-CM | POA: Diagnosis not present

## 2018-08-31 DIAGNOSIS — N2581 Secondary hyperparathyroidism of renal origin: Secondary | ICD-10-CM | POA: Diagnosis not present

## 2018-09-02 DIAGNOSIS — N2581 Secondary hyperparathyroidism of renal origin: Secondary | ICD-10-CM | POA: Diagnosis not present

## 2018-09-02 DIAGNOSIS — D631 Anemia in chronic kidney disease: Secondary | ICD-10-CM | POA: Diagnosis not present

## 2018-09-02 DIAGNOSIS — N186 End stage renal disease: Secondary | ICD-10-CM | POA: Diagnosis not present

## 2018-09-02 DIAGNOSIS — E876 Hypokalemia: Secondary | ICD-10-CM | POA: Diagnosis not present

## 2018-09-04 DIAGNOSIS — E876 Hypokalemia: Secondary | ICD-10-CM | POA: Diagnosis not present

## 2018-09-04 DIAGNOSIS — D631 Anemia in chronic kidney disease: Secondary | ICD-10-CM | POA: Diagnosis not present

## 2018-09-04 DIAGNOSIS — N2581 Secondary hyperparathyroidism of renal origin: Secondary | ICD-10-CM | POA: Diagnosis not present

## 2018-09-04 DIAGNOSIS — N186 End stage renal disease: Secondary | ICD-10-CM | POA: Diagnosis not present

## 2018-09-05 ENCOUNTER — Other Ambulatory Visit: Payer: Self-pay | Admitting: Pulmonary Disease

## 2018-09-07 DIAGNOSIS — N186 End stage renal disease: Secondary | ICD-10-CM | POA: Diagnosis not present

## 2018-09-07 DIAGNOSIS — N2581 Secondary hyperparathyroidism of renal origin: Secondary | ICD-10-CM | POA: Diagnosis not present

## 2018-09-07 DIAGNOSIS — E876 Hypokalemia: Secondary | ICD-10-CM | POA: Diagnosis not present

## 2018-09-07 DIAGNOSIS — D631 Anemia in chronic kidney disease: Secondary | ICD-10-CM | POA: Diagnosis not present

## 2018-09-09 DIAGNOSIS — N186 End stage renal disease: Secondary | ICD-10-CM | POA: Diagnosis not present

## 2018-09-09 DIAGNOSIS — E876 Hypokalemia: Secondary | ICD-10-CM | POA: Diagnosis not present

## 2018-09-09 DIAGNOSIS — D631 Anemia in chronic kidney disease: Secondary | ICD-10-CM | POA: Diagnosis not present

## 2018-09-09 DIAGNOSIS — N2581 Secondary hyperparathyroidism of renal origin: Secondary | ICD-10-CM | POA: Diagnosis not present

## 2018-09-11 DIAGNOSIS — N2581 Secondary hyperparathyroidism of renal origin: Secondary | ICD-10-CM | POA: Diagnosis not present

## 2018-09-11 DIAGNOSIS — D631 Anemia in chronic kidney disease: Secondary | ICD-10-CM | POA: Diagnosis not present

## 2018-09-11 DIAGNOSIS — N186 End stage renal disease: Secondary | ICD-10-CM | POA: Diagnosis not present

## 2018-09-11 DIAGNOSIS — E876 Hypokalemia: Secondary | ICD-10-CM | POA: Diagnosis not present

## 2018-09-14 DIAGNOSIS — E876 Hypokalemia: Secondary | ICD-10-CM | POA: Diagnosis not present

## 2018-09-14 DIAGNOSIS — N186 End stage renal disease: Secondary | ICD-10-CM | POA: Diagnosis not present

## 2018-09-14 DIAGNOSIS — N2581 Secondary hyperparathyroidism of renal origin: Secondary | ICD-10-CM | POA: Diagnosis not present

## 2018-09-14 DIAGNOSIS — D631 Anemia in chronic kidney disease: Secondary | ICD-10-CM | POA: Diagnosis not present

## 2018-09-16 DIAGNOSIS — N2581 Secondary hyperparathyroidism of renal origin: Secondary | ICD-10-CM | POA: Diagnosis not present

## 2018-09-16 DIAGNOSIS — N186 End stage renal disease: Secondary | ICD-10-CM | POA: Diagnosis not present

## 2018-09-16 DIAGNOSIS — D631 Anemia in chronic kidney disease: Secondary | ICD-10-CM | POA: Diagnosis not present

## 2018-09-16 DIAGNOSIS — E876 Hypokalemia: Secondary | ICD-10-CM | POA: Diagnosis not present

## 2018-09-18 DIAGNOSIS — E876 Hypokalemia: Secondary | ICD-10-CM | POA: Diagnosis not present

## 2018-09-18 DIAGNOSIS — N186 End stage renal disease: Secondary | ICD-10-CM | POA: Diagnosis not present

## 2018-09-18 DIAGNOSIS — D631 Anemia in chronic kidney disease: Secondary | ICD-10-CM | POA: Diagnosis not present

## 2018-09-18 DIAGNOSIS — N2581 Secondary hyperparathyroidism of renal origin: Secondary | ICD-10-CM | POA: Diagnosis not present

## 2018-09-21 DIAGNOSIS — E876 Hypokalemia: Secondary | ICD-10-CM | POA: Diagnosis not present

## 2018-09-21 DIAGNOSIS — N186 End stage renal disease: Secondary | ICD-10-CM | POA: Diagnosis not present

## 2018-09-21 DIAGNOSIS — N2581 Secondary hyperparathyroidism of renal origin: Secondary | ICD-10-CM | POA: Diagnosis not present

## 2018-09-21 DIAGNOSIS — D631 Anemia in chronic kidney disease: Secondary | ICD-10-CM | POA: Diagnosis not present

## 2018-09-22 DIAGNOSIS — Z992 Dependence on renal dialysis: Secondary | ICD-10-CM | POA: Diagnosis not present

## 2018-09-22 DIAGNOSIS — N186 End stage renal disease: Secondary | ICD-10-CM | POA: Diagnosis not present

## 2018-09-22 DIAGNOSIS — T8612 Kidney transplant failure: Secondary | ICD-10-CM | POA: Diagnosis not present

## 2018-09-23 DIAGNOSIS — D631 Anemia in chronic kidney disease: Secondary | ICD-10-CM | POA: Diagnosis not present

## 2018-09-23 DIAGNOSIS — N186 End stage renal disease: Secondary | ICD-10-CM | POA: Diagnosis not present

## 2018-09-23 DIAGNOSIS — E876 Hypokalemia: Secondary | ICD-10-CM | POA: Diagnosis not present

## 2018-09-23 DIAGNOSIS — N2581 Secondary hyperparathyroidism of renal origin: Secondary | ICD-10-CM | POA: Diagnosis not present

## 2018-09-25 DIAGNOSIS — N2581 Secondary hyperparathyroidism of renal origin: Secondary | ICD-10-CM | POA: Diagnosis not present

## 2018-09-25 DIAGNOSIS — D631 Anemia in chronic kidney disease: Secondary | ICD-10-CM | POA: Diagnosis not present

## 2018-09-25 DIAGNOSIS — E876 Hypokalemia: Secondary | ICD-10-CM | POA: Diagnosis not present

## 2018-09-25 DIAGNOSIS — N186 End stage renal disease: Secondary | ICD-10-CM | POA: Diagnosis not present

## 2018-09-28 DIAGNOSIS — N186 End stage renal disease: Secondary | ICD-10-CM | POA: Diagnosis not present

## 2018-09-28 DIAGNOSIS — N2581 Secondary hyperparathyroidism of renal origin: Secondary | ICD-10-CM | POA: Diagnosis not present

## 2018-09-28 DIAGNOSIS — D631 Anemia in chronic kidney disease: Secondary | ICD-10-CM | POA: Diagnosis not present

## 2018-09-28 DIAGNOSIS — E876 Hypokalemia: Secondary | ICD-10-CM | POA: Diagnosis not present

## 2018-09-30 DIAGNOSIS — N186 End stage renal disease: Secondary | ICD-10-CM | POA: Diagnosis not present

## 2018-09-30 DIAGNOSIS — N2581 Secondary hyperparathyroidism of renal origin: Secondary | ICD-10-CM | POA: Diagnosis not present

## 2018-09-30 DIAGNOSIS — E876 Hypokalemia: Secondary | ICD-10-CM | POA: Diagnosis not present

## 2018-09-30 DIAGNOSIS — D631 Anemia in chronic kidney disease: Secondary | ICD-10-CM | POA: Diagnosis not present

## 2018-10-02 DIAGNOSIS — E876 Hypokalemia: Secondary | ICD-10-CM | POA: Diagnosis not present

## 2018-10-02 DIAGNOSIS — N2581 Secondary hyperparathyroidism of renal origin: Secondary | ICD-10-CM | POA: Diagnosis not present

## 2018-10-02 DIAGNOSIS — D631 Anemia in chronic kidney disease: Secondary | ICD-10-CM | POA: Diagnosis not present

## 2018-10-02 DIAGNOSIS — N186 End stage renal disease: Secondary | ICD-10-CM | POA: Diagnosis not present

## 2018-10-05 ENCOUNTER — Other Ambulatory Visit: Payer: Self-pay | Admitting: Pulmonary Disease

## 2018-10-05 DIAGNOSIS — N2581 Secondary hyperparathyroidism of renal origin: Secondary | ICD-10-CM | POA: Diagnosis not present

## 2018-10-05 DIAGNOSIS — E876 Hypokalemia: Secondary | ICD-10-CM | POA: Diagnosis not present

## 2018-10-05 DIAGNOSIS — N186 End stage renal disease: Secondary | ICD-10-CM | POA: Diagnosis not present

## 2018-10-05 DIAGNOSIS — D631 Anemia in chronic kidney disease: Secondary | ICD-10-CM | POA: Diagnosis not present

## 2018-10-07 DIAGNOSIS — E876 Hypokalemia: Secondary | ICD-10-CM | POA: Diagnosis not present

## 2018-10-07 DIAGNOSIS — N2581 Secondary hyperparathyroidism of renal origin: Secondary | ICD-10-CM | POA: Diagnosis not present

## 2018-10-07 DIAGNOSIS — N186 End stage renal disease: Secondary | ICD-10-CM | POA: Diagnosis not present

## 2018-10-07 DIAGNOSIS — D631 Anemia in chronic kidney disease: Secondary | ICD-10-CM | POA: Diagnosis not present

## 2018-10-09 DIAGNOSIS — E876 Hypokalemia: Secondary | ICD-10-CM | POA: Diagnosis not present

## 2018-10-09 DIAGNOSIS — N2581 Secondary hyperparathyroidism of renal origin: Secondary | ICD-10-CM | POA: Diagnosis not present

## 2018-10-09 DIAGNOSIS — N186 End stage renal disease: Secondary | ICD-10-CM | POA: Diagnosis not present

## 2018-10-09 DIAGNOSIS — D631 Anemia in chronic kidney disease: Secondary | ICD-10-CM | POA: Diagnosis not present

## 2018-10-12 DIAGNOSIS — N2581 Secondary hyperparathyroidism of renal origin: Secondary | ICD-10-CM | POA: Diagnosis not present

## 2018-10-12 DIAGNOSIS — N186 End stage renal disease: Secondary | ICD-10-CM | POA: Diagnosis not present

## 2018-10-12 DIAGNOSIS — E876 Hypokalemia: Secondary | ICD-10-CM | POA: Diagnosis not present

## 2018-10-12 DIAGNOSIS — D631 Anemia in chronic kidney disease: Secondary | ICD-10-CM | POA: Diagnosis not present

## 2018-10-14 DIAGNOSIS — E876 Hypokalemia: Secondary | ICD-10-CM | POA: Diagnosis not present

## 2018-10-14 DIAGNOSIS — N2581 Secondary hyperparathyroidism of renal origin: Secondary | ICD-10-CM | POA: Diagnosis not present

## 2018-10-14 DIAGNOSIS — D631 Anemia in chronic kidney disease: Secondary | ICD-10-CM | POA: Diagnosis not present

## 2018-10-14 DIAGNOSIS — N186 End stage renal disease: Secondary | ICD-10-CM | POA: Diagnosis not present

## 2018-10-15 ENCOUNTER — Other Ambulatory Visit: Payer: Self-pay | Admitting: Cardiology

## 2018-10-15 DIAGNOSIS — I10 Essential (primary) hypertension: Secondary | ICD-10-CM

## 2018-10-16 DIAGNOSIS — E876 Hypokalemia: Secondary | ICD-10-CM | POA: Diagnosis not present

## 2018-10-16 DIAGNOSIS — N2581 Secondary hyperparathyroidism of renal origin: Secondary | ICD-10-CM | POA: Diagnosis not present

## 2018-10-16 DIAGNOSIS — N186 End stage renal disease: Secondary | ICD-10-CM | POA: Diagnosis not present

## 2018-10-16 DIAGNOSIS — D631 Anemia in chronic kidney disease: Secondary | ICD-10-CM | POA: Diagnosis not present

## 2018-10-19 DIAGNOSIS — N2581 Secondary hyperparathyroidism of renal origin: Secondary | ICD-10-CM | POA: Diagnosis not present

## 2018-10-19 DIAGNOSIS — E876 Hypokalemia: Secondary | ICD-10-CM | POA: Diagnosis not present

## 2018-10-19 DIAGNOSIS — N186 End stage renal disease: Secondary | ICD-10-CM | POA: Diagnosis not present

## 2018-10-19 DIAGNOSIS — D631 Anemia in chronic kidney disease: Secondary | ICD-10-CM | POA: Diagnosis not present

## 2018-10-20 NOTE — Telephone Encounter (Signed)
I refilled this medicine as a medicine that was he was already on.  Would need to confirm if he is actually still taking it (probably the best would be from his nephrologist - Dr. Lorrene Reid of [facility]) --> I would say it is okay to refill since I never stopped it.  I have not seen him in over a year.  [place]

## 2018-10-21 DIAGNOSIS — N2581 Secondary hyperparathyroidism of renal origin: Secondary | ICD-10-CM | POA: Diagnosis not present

## 2018-10-21 DIAGNOSIS — E876 Hypokalemia: Secondary | ICD-10-CM | POA: Diagnosis not present

## 2018-10-21 DIAGNOSIS — N186 End stage renal disease: Secondary | ICD-10-CM | POA: Diagnosis not present

## 2018-10-21 DIAGNOSIS — D631 Anemia in chronic kidney disease: Secondary | ICD-10-CM | POA: Diagnosis not present

## 2018-10-23 DIAGNOSIS — D509 Iron deficiency anemia, unspecified: Secondary | ICD-10-CM | POA: Diagnosis not present

## 2018-10-23 DIAGNOSIS — N2581 Secondary hyperparathyroidism of renal origin: Secondary | ICD-10-CM | POA: Diagnosis not present

## 2018-10-23 DIAGNOSIS — Z992 Dependence on renal dialysis: Secondary | ICD-10-CM | POA: Diagnosis not present

## 2018-10-23 DIAGNOSIS — N186 End stage renal disease: Secondary | ICD-10-CM | POA: Diagnosis not present

## 2018-10-23 DIAGNOSIS — T8612 Kidney transplant failure: Secondary | ICD-10-CM | POA: Diagnosis not present

## 2018-10-23 DIAGNOSIS — E876 Hypokalemia: Secondary | ICD-10-CM | POA: Diagnosis not present

## 2018-10-26 DIAGNOSIS — Z992 Dependence on renal dialysis: Secondary | ICD-10-CM | POA: Diagnosis not present

## 2018-10-26 DIAGNOSIS — D509 Iron deficiency anemia, unspecified: Secondary | ICD-10-CM | POA: Diagnosis not present

## 2018-10-26 DIAGNOSIS — N2581 Secondary hyperparathyroidism of renal origin: Secondary | ICD-10-CM | POA: Diagnosis not present

## 2018-10-26 DIAGNOSIS — E876 Hypokalemia: Secondary | ICD-10-CM | POA: Diagnosis not present

## 2018-10-26 DIAGNOSIS — N186 End stage renal disease: Secondary | ICD-10-CM | POA: Diagnosis not present

## 2018-10-28 DIAGNOSIS — Z992 Dependence on renal dialysis: Secondary | ICD-10-CM | POA: Diagnosis not present

## 2018-10-28 DIAGNOSIS — N186 End stage renal disease: Secondary | ICD-10-CM | POA: Diagnosis not present

## 2018-10-28 DIAGNOSIS — N2581 Secondary hyperparathyroidism of renal origin: Secondary | ICD-10-CM | POA: Diagnosis not present

## 2018-10-28 DIAGNOSIS — E876 Hypokalemia: Secondary | ICD-10-CM | POA: Diagnosis not present

## 2018-10-28 DIAGNOSIS — D509 Iron deficiency anemia, unspecified: Secondary | ICD-10-CM | POA: Diagnosis not present

## 2018-10-29 ENCOUNTER — Other Ambulatory Visit: Payer: Self-pay

## 2018-10-30 DIAGNOSIS — D509 Iron deficiency anemia, unspecified: Secondary | ICD-10-CM | POA: Diagnosis not present

## 2018-10-30 DIAGNOSIS — Z992 Dependence on renal dialysis: Secondary | ICD-10-CM | POA: Diagnosis not present

## 2018-10-30 DIAGNOSIS — N2581 Secondary hyperparathyroidism of renal origin: Secondary | ICD-10-CM | POA: Diagnosis not present

## 2018-10-30 DIAGNOSIS — E876 Hypokalemia: Secondary | ICD-10-CM | POA: Diagnosis not present

## 2018-10-30 DIAGNOSIS — N186 End stage renal disease: Secondary | ICD-10-CM | POA: Diagnosis not present

## 2018-11-02 DIAGNOSIS — E876 Hypokalemia: Secondary | ICD-10-CM | POA: Diagnosis not present

## 2018-11-02 DIAGNOSIS — N186 End stage renal disease: Secondary | ICD-10-CM | POA: Diagnosis not present

## 2018-11-02 DIAGNOSIS — Z992 Dependence on renal dialysis: Secondary | ICD-10-CM | POA: Diagnosis not present

## 2018-11-02 DIAGNOSIS — N2581 Secondary hyperparathyroidism of renal origin: Secondary | ICD-10-CM | POA: Diagnosis not present

## 2018-11-02 DIAGNOSIS — D509 Iron deficiency anemia, unspecified: Secondary | ICD-10-CM | POA: Diagnosis not present

## 2018-11-04 DIAGNOSIS — Z992 Dependence on renal dialysis: Secondary | ICD-10-CM | POA: Diagnosis not present

## 2018-11-04 DIAGNOSIS — N186 End stage renal disease: Secondary | ICD-10-CM | POA: Diagnosis not present

## 2018-11-04 DIAGNOSIS — D509 Iron deficiency anemia, unspecified: Secondary | ICD-10-CM | POA: Diagnosis not present

## 2018-11-04 DIAGNOSIS — E876 Hypokalemia: Secondary | ICD-10-CM | POA: Diagnosis not present

## 2018-11-04 DIAGNOSIS — N2581 Secondary hyperparathyroidism of renal origin: Secondary | ICD-10-CM | POA: Diagnosis not present

## 2018-11-06 DIAGNOSIS — N2581 Secondary hyperparathyroidism of renal origin: Secondary | ICD-10-CM | POA: Diagnosis not present

## 2018-11-06 DIAGNOSIS — Z992 Dependence on renal dialysis: Secondary | ICD-10-CM | POA: Diagnosis not present

## 2018-11-06 DIAGNOSIS — D509 Iron deficiency anemia, unspecified: Secondary | ICD-10-CM | POA: Diagnosis not present

## 2018-11-06 DIAGNOSIS — N186 End stage renal disease: Secondary | ICD-10-CM | POA: Diagnosis not present

## 2018-11-06 DIAGNOSIS — E876 Hypokalemia: Secondary | ICD-10-CM | POA: Diagnosis not present

## 2018-11-09 DIAGNOSIS — N186 End stage renal disease: Secondary | ICD-10-CM | POA: Diagnosis not present

## 2018-11-09 DIAGNOSIS — N2581 Secondary hyperparathyroidism of renal origin: Secondary | ICD-10-CM | POA: Diagnosis not present

## 2018-11-09 DIAGNOSIS — E876 Hypokalemia: Secondary | ICD-10-CM | POA: Diagnosis not present

## 2018-11-09 DIAGNOSIS — D509 Iron deficiency anemia, unspecified: Secondary | ICD-10-CM | POA: Diagnosis not present

## 2018-11-09 DIAGNOSIS — Z992 Dependence on renal dialysis: Secondary | ICD-10-CM | POA: Diagnosis not present

## 2018-11-10 ENCOUNTER — Encounter (HOSPITAL_COMMUNITY): Payer: Medicare Other

## 2018-11-10 ENCOUNTER — Other Ambulatory Visit (HOSPITAL_COMMUNITY): Payer: Medicare Other

## 2018-11-10 ENCOUNTER — Ambulatory Visit: Payer: Medicare Other | Admitting: Vascular Surgery

## 2018-11-11 ENCOUNTER — Other Ambulatory Visit: Payer: Self-pay | Admitting: Cardiology

## 2018-11-11 DIAGNOSIS — Z992 Dependence on renal dialysis: Secondary | ICD-10-CM | POA: Diagnosis not present

## 2018-11-11 DIAGNOSIS — N2581 Secondary hyperparathyroidism of renal origin: Secondary | ICD-10-CM | POA: Diagnosis not present

## 2018-11-11 DIAGNOSIS — N186 End stage renal disease: Secondary | ICD-10-CM | POA: Diagnosis not present

## 2018-11-11 DIAGNOSIS — I10 Essential (primary) hypertension: Secondary | ICD-10-CM

## 2018-11-11 DIAGNOSIS — D509 Iron deficiency anemia, unspecified: Secondary | ICD-10-CM | POA: Diagnosis not present

## 2018-11-11 DIAGNOSIS — E876 Hypokalemia: Secondary | ICD-10-CM | POA: Diagnosis not present

## 2018-11-13 DIAGNOSIS — Z992 Dependence on renal dialysis: Secondary | ICD-10-CM | POA: Diagnosis not present

## 2018-11-13 DIAGNOSIS — N186 End stage renal disease: Secondary | ICD-10-CM | POA: Diagnosis not present

## 2018-11-13 DIAGNOSIS — E876 Hypokalemia: Secondary | ICD-10-CM | POA: Diagnosis not present

## 2018-11-13 DIAGNOSIS — N2581 Secondary hyperparathyroidism of renal origin: Secondary | ICD-10-CM | POA: Diagnosis not present

## 2018-11-13 DIAGNOSIS — D509 Iron deficiency anemia, unspecified: Secondary | ICD-10-CM | POA: Diagnosis not present

## 2018-11-16 ENCOUNTER — Other Ambulatory Visit: Payer: Self-pay

## 2018-11-16 DIAGNOSIS — Z992 Dependence on renal dialysis: Secondary | ICD-10-CM

## 2018-11-16 DIAGNOSIS — E876 Hypokalemia: Secondary | ICD-10-CM | POA: Diagnosis not present

## 2018-11-16 DIAGNOSIS — N186 End stage renal disease: Secondary | ICD-10-CM | POA: Diagnosis not present

## 2018-11-16 DIAGNOSIS — D509 Iron deficiency anemia, unspecified: Secondary | ICD-10-CM | POA: Diagnosis not present

## 2018-11-16 DIAGNOSIS — N2581 Secondary hyperparathyroidism of renal origin: Secondary | ICD-10-CM | POA: Diagnosis not present

## 2018-11-18 ENCOUNTER — Encounter (HOSPITAL_COMMUNITY): Payer: Medicare Other

## 2018-11-18 ENCOUNTER — Ambulatory Visit: Payer: Medicare Other | Admitting: Vascular Surgery

## 2018-11-18 ENCOUNTER — Other Ambulatory Visit (HOSPITAL_COMMUNITY): Payer: Medicare Other

## 2018-11-18 DIAGNOSIS — E876 Hypokalemia: Secondary | ICD-10-CM | POA: Diagnosis not present

## 2018-11-18 DIAGNOSIS — D509 Iron deficiency anemia, unspecified: Secondary | ICD-10-CM | POA: Diagnosis not present

## 2018-11-18 DIAGNOSIS — Z992 Dependence on renal dialysis: Secondary | ICD-10-CM | POA: Diagnosis not present

## 2018-11-18 DIAGNOSIS — N2581 Secondary hyperparathyroidism of renal origin: Secondary | ICD-10-CM | POA: Diagnosis not present

## 2018-11-18 DIAGNOSIS — N186 End stage renal disease: Secondary | ICD-10-CM | POA: Diagnosis not present

## 2018-11-20 DIAGNOSIS — D509 Iron deficiency anemia, unspecified: Secondary | ICD-10-CM | POA: Diagnosis not present

## 2018-11-20 DIAGNOSIS — E876 Hypokalemia: Secondary | ICD-10-CM | POA: Diagnosis not present

## 2018-11-20 DIAGNOSIS — N2581 Secondary hyperparathyroidism of renal origin: Secondary | ICD-10-CM | POA: Diagnosis not present

## 2018-11-20 DIAGNOSIS — N186 End stage renal disease: Secondary | ICD-10-CM | POA: Diagnosis not present

## 2018-11-20 DIAGNOSIS — Z992 Dependence on renal dialysis: Secondary | ICD-10-CM | POA: Diagnosis not present

## 2018-11-23 DIAGNOSIS — T8612 Kidney transplant failure: Secondary | ICD-10-CM | POA: Diagnosis not present

## 2018-11-23 DIAGNOSIS — Z992 Dependence on renal dialysis: Secondary | ICD-10-CM | POA: Diagnosis not present

## 2018-11-23 DIAGNOSIS — E876 Hypokalemia: Secondary | ICD-10-CM | POA: Diagnosis not present

## 2018-11-23 DIAGNOSIS — N186 End stage renal disease: Secondary | ICD-10-CM | POA: Diagnosis not present

## 2018-11-23 DIAGNOSIS — Z23 Encounter for immunization: Secondary | ICD-10-CM | POA: Diagnosis not present

## 2018-11-23 DIAGNOSIS — D509 Iron deficiency anemia, unspecified: Secondary | ICD-10-CM | POA: Diagnosis not present

## 2018-11-23 DIAGNOSIS — N2581 Secondary hyperparathyroidism of renal origin: Secondary | ICD-10-CM | POA: Diagnosis not present

## 2018-11-25 DIAGNOSIS — D509 Iron deficiency anemia, unspecified: Secondary | ICD-10-CM | POA: Diagnosis not present

## 2018-11-25 DIAGNOSIS — Z23 Encounter for immunization: Secondary | ICD-10-CM | POA: Diagnosis not present

## 2018-11-25 DIAGNOSIS — N186 End stage renal disease: Secondary | ICD-10-CM | POA: Diagnosis not present

## 2018-11-25 DIAGNOSIS — N2581 Secondary hyperparathyroidism of renal origin: Secondary | ICD-10-CM | POA: Diagnosis not present

## 2018-11-25 DIAGNOSIS — Z992 Dependence on renal dialysis: Secondary | ICD-10-CM | POA: Diagnosis not present

## 2018-11-25 DIAGNOSIS — E876 Hypokalemia: Secondary | ICD-10-CM | POA: Diagnosis not present

## 2018-11-27 DIAGNOSIS — Z23 Encounter for immunization: Secondary | ICD-10-CM | POA: Diagnosis not present

## 2018-11-27 DIAGNOSIS — Z992 Dependence on renal dialysis: Secondary | ICD-10-CM | POA: Diagnosis not present

## 2018-11-27 DIAGNOSIS — N2581 Secondary hyperparathyroidism of renal origin: Secondary | ICD-10-CM | POA: Diagnosis not present

## 2018-11-27 DIAGNOSIS — E876 Hypokalemia: Secondary | ICD-10-CM | POA: Diagnosis not present

## 2018-11-27 DIAGNOSIS — D509 Iron deficiency anemia, unspecified: Secondary | ICD-10-CM | POA: Diagnosis not present

## 2018-11-27 DIAGNOSIS — N186 End stage renal disease: Secondary | ICD-10-CM | POA: Diagnosis not present

## 2018-11-30 DIAGNOSIS — Z23 Encounter for immunization: Secondary | ICD-10-CM | POA: Diagnosis not present

## 2018-11-30 DIAGNOSIS — N186 End stage renal disease: Secondary | ICD-10-CM | POA: Diagnosis not present

## 2018-11-30 DIAGNOSIS — N2581 Secondary hyperparathyroidism of renal origin: Secondary | ICD-10-CM | POA: Diagnosis not present

## 2018-11-30 DIAGNOSIS — D509 Iron deficiency anemia, unspecified: Secondary | ICD-10-CM | POA: Diagnosis not present

## 2018-11-30 DIAGNOSIS — E876 Hypokalemia: Secondary | ICD-10-CM | POA: Diagnosis not present

## 2018-11-30 DIAGNOSIS — Z992 Dependence on renal dialysis: Secondary | ICD-10-CM | POA: Diagnosis not present

## 2018-12-02 DIAGNOSIS — N2581 Secondary hyperparathyroidism of renal origin: Secondary | ICD-10-CM | POA: Diagnosis not present

## 2018-12-02 DIAGNOSIS — Z992 Dependence on renal dialysis: Secondary | ICD-10-CM | POA: Diagnosis not present

## 2018-12-02 DIAGNOSIS — Z23 Encounter for immunization: Secondary | ICD-10-CM | POA: Diagnosis not present

## 2018-12-02 DIAGNOSIS — D509 Iron deficiency anemia, unspecified: Secondary | ICD-10-CM | POA: Diagnosis not present

## 2018-12-02 DIAGNOSIS — E876 Hypokalemia: Secondary | ICD-10-CM | POA: Diagnosis not present

## 2018-12-02 DIAGNOSIS — N186 End stage renal disease: Secondary | ICD-10-CM | POA: Diagnosis not present

## 2018-12-04 DIAGNOSIS — E876 Hypokalemia: Secondary | ICD-10-CM | POA: Diagnosis not present

## 2018-12-04 DIAGNOSIS — D509 Iron deficiency anemia, unspecified: Secondary | ICD-10-CM | POA: Diagnosis not present

## 2018-12-04 DIAGNOSIS — N186 End stage renal disease: Secondary | ICD-10-CM | POA: Diagnosis not present

## 2018-12-04 DIAGNOSIS — Z23 Encounter for immunization: Secondary | ICD-10-CM | POA: Diagnosis not present

## 2018-12-04 DIAGNOSIS — N2581 Secondary hyperparathyroidism of renal origin: Secondary | ICD-10-CM | POA: Diagnosis not present

## 2018-12-04 DIAGNOSIS — Z992 Dependence on renal dialysis: Secondary | ICD-10-CM | POA: Diagnosis not present

## 2018-12-07 DIAGNOSIS — N186 End stage renal disease: Secondary | ICD-10-CM | POA: Diagnosis not present

## 2018-12-07 DIAGNOSIS — D509 Iron deficiency anemia, unspecified: Secondary | ICD-10-CM | POA: Diagnosis not present

## 2018-12-07 DIAGNOSIS — Z23 Encounter for immunization: Secondary | ICD-10-CM | POA: Diagnosis not present

## 2018-12-07 DIAGNOSIS — N2581 Secondary hyperparathyroidism of renal origin: Secondary | ICD-10-CM | POA: Diagnosis not present

## 2018-12-07 DIAGNOSIS — E876 Hypokalemia: Secondary | ICD-10-CM | POA: Diagnosis not present

## 2018-12-07 DIAGNOSIS — Z992 Dependence on renal dialysis: Secondary | ICD-10-CM | POA: Diagnosis not present

## 2018-12-09 DIAGNOSIS — N2581 Secondary hyperparathyroidism of renal origin: Secondary | ICD-10-CM | POA: Diagnosis not present

## 2018-12-09 DIAGNOSIS — E876 Hypokalemia: Secondary | ICD-10-CM | POA: Diagnosis not present

## 2018-12-09 DIAGNOSIS — Z23 Encounter for immunization: Secondary | ICD-10-CM | POA: Diagnosis not present

## 2018-12-09 DIAGNOSIS — N186 End stage renal disease: Secondary | ICD-10-CM | POA: Diagnosis not present

## 2018-12-09 DIAGNOSIS — D509 Iron deficiency anemia, unspecified: Secondary | ICD-10-CM | POA: Diagnosis not present

## 2018-12-09 DIAGNOSIS — Z992 Dependence on renal dialysis: Secondary | ICD-10-CM | POA: Diagnosis not present

## 2018-12-11 DIAGNOSIS — D509 Iron deficiency anemia, unspecified: Secondary | ICD-10-CM | POA: Diagnosis not present

## 2018-12-11 DIAGNOSIS — E876 Hypokalemia: Secondary | ICD-10-CM | POA: Diagnosis not present

## 2018-12-11 DIAGNOSIS — Z992 Dependence on renal dialysis: Secondary | ICD-10-CM | POA: Diagnosis not present

## 2018-12-11 DIAGNOSIS — N186 End stage renal disease: Secondary | ICD-10-CM | POA: Diagnosis not present

## 2018-12-11 DIAGNOSIS — Z23 Encounter for immunization: Secondary | ICD-10-CM | POA: Diagnosis not present

## 2018-12-11 DIAGNOSIS — N2581 Secondary hyperparathyroidism of renal origin: Secondary | ICD-10-CM | POA: Diagnosis not present

## 2018-12-14 DIAGNOSIS — D509 Iron deficiency anemia, unspecified: Secondary | ICD-10-CM | POA: Diagnosis not present

## 2018-12-14 DIAGNOSIS — N186 End stage renal disease: Secondary | ICD-10-CM | POA: Diagnosis not present

## 2018-12-14 DIAGNOSIS — E876 Hypokalemia: Secondary | ICD-10-CM | POA: Diagnosis not present

## 2018-12-14 DIAGNOSIS — Z992 Dependence on renal dialysis: Secondary | ICD-10-CM | POA: Diagnosis not present

## 2018-12-14 DIAGNOSIS — N2581 Secondary hyperparathyroidism of renal origin: Secondary | ICD-10-CM | POA: Diagnosis not present

## 2018-12-14 DIAGNOSIS — Z23 Encounter for immunization: Secondary | ICD-10-CM | POA: Diagnosis not present

## 2018-12-16 DIAGNOSIS — N186 End stage renal disease: Secondary | ICD-10-CM | POA: Diagnosis not present

## 2018-12-16 DIAGNOSIS — N2581 Secondary hyperparathyroidism of renal origin: Secondary | ICD-10-CM | POA: Diagnosis not present

## 2018-12-16 DIAGNOSIS — Z23 Encounter for immunization: Secondary | ICD-10-CM | POA: Diagnosis not present

## 2018-12-16 DIAGNOSIS — E876 Hypokalemia: Secondary | ICD-10-CM | POA: Diagnosis not present

## 2018-12-16 DIAGNOSIS — D509 Iron deficiency anemia, unspecified: Secondary | ICD-10-CM | POA: Diagnosis not present

## 2018-12-16 DIAGNOSIS — Z992 Dependence on renal dialysis: Secondary | ICD-10-CM | POA: Diagnosis not present

## 2018-12-18 DIAGNOSIS — E876 Hypokalemia: Secondary | ICD-10-CM | POA: Diagnosis not present

## 2018-12-18 DIAGNOSIS — Z992 Dependence on renal dialysis: Secondary | ICD-10-CM | POA: Diagnosis not present

## 2018-12-18 DIAGNOSIS — D509 Iron deficiency anemia, unspecified: Secondary | ICD-10-CM | POA: Diagnosis not present

## 2018-12-18 DIAGNOSIS — N186 End stage renal disease: Secondary | ICD-10-CM | POA: Diagnosis not present

## 2018-12-18 DIAGNOSIS — N2581 Secondary hyperparathyroidism of renal origin: Secondary | ICD-10-CM | POA: Diagnosis not present

## 2018-12-18 DIAGNOSIS — Z23 Encounter for immunization: Secondary | ICD-10-CM | POA: Diagnosis not present

## 2018-12-21 DIAGNOSIS — Z992 Dependence on renal dialysis: Secondary | ICD-10-CM | POA: Diagnosis not present

## 2018-12-21 DIAGNOSIS — N2581 Secondary hyperparathyroidism of renal origin: Secondary | ICD-10-CM | POA: Diagnosis not present

## 2018-12-21 DIAGNOSIS — E876 Hypokalemia: Secondary | ICD-10-CM | POA: Diagnosis not present

## 2018-12-21 DIAGNOSIS — D509 Iron deficiency anemia, unspecified: Secondary | ICD-10-CM | POA: Diagnosis not present

## 2018-12-21 DIAGNOSIS — N186 End stage renal disease: Secondary | ICD-10-CM | POA: Diagnosis not present

## 2018-12-21 DIAGNOSIS — Z23 Encounter for immunization: Secondary | ICD-10-CM | POA: Diagnosis not present

## 2018-12-23 DIAGNOSIS — E876 Hypokalemia: Secondary | ICD-10-CM | POA: Diagnosis not present

## 2018-12-23 DIAGNOSIS — T8612 Kidney transplant failure: Secondary | ICD-10-CM | POA: Diagnosis not present

## 2018-12-23 DIAGNOSIS — Z992 Dependence on renal dialysis: Secondary | ICD-10-CM | POA: Diagnosis not present

## 2018-12-23 DIAGNOSIS — N2581 Secondary hyperparathyroidism of renal origin: Secondary | ICD-10-CM | POA: Diagnosis not present

## 2018-12-23 DIAGNOSIS — D509 Iron deficiency anemia, unspecified: Secondary | ICD-10-CM | POA: Diagnosis not present

## 2018-12-23 DIAGNOSIS — D631 Anemia in chronic kidney disease: Secondary | ICD-10-CM | POA: Diagnosis not present

## 2018-12-23 DIAGNOSIS — N186 End stage renal disease: Secondary | ICD-10-CM | POA: Diagnosis not present

## 2018-12-25 DIAGNOSIS — N186 End stage renal disease: Secondary | ICD-10-CM | POA: Diagnosis not present

## 2018-12-25 DIAGNOSIS — E876 Hypokalemia: Secondary | ICD-10-CM | POA: Diagnosis not present

## 2018-12-25 DIAGNOSIS — D631 Anemia in chronic kidney disease: Secondary | ICD-10-CM | POA: Diagnosis not present

## 2018-12-25 DIAGNOSIS — Z992 Dependence on renal dialysis: Secondary | ICD-10-CM | POA: Diagnosis not present

## 2018-12-25 DIAGNOSIS — D509 Iron deficiency anemia, unspecified: Secondary | ICD-10-CM | POA: Diagnosis not present

## 2018-12-25 DIAGNOSIS — N2581 Secondary hyperparathyroidism of renal origin: Secondary | ICD-10-CM | POA: Diagnosis not present

## 2018-12-27 DIAGNOSIS — T829XXA Unspecified complication of cardiac and vascular prosthetic device, implant and graft, initial encounter: Secondary | ICD-10-CM | POA: Insufficient documentation

## 2018-12-28 DIAGNOSIS — E876 Hypokalemia: Secondary | ICD-10-CM | POA: Diagnosis not present

## 2018-12-28 DIAGNOSIS — Z992 Dependence on renal dialysis: Secondary | ICD-10-CM | POA: Diagnosis not present

## 2018-12-28 DIAGNOSIS — D631 Anemia in chronic kidney disease: Secondary | ICD-10-CM | POA: Diagnosis not present

## 2018-12-28 DIAGNOSIS — D509 Iron deficiency anemia, unspecified: Secondary | ICD-10-CM | POA: Diagnosis not present

## 2018-12-28 DIAGNOSIS — N2581 Secondary hyperparathyroidism of renal origin: Secondary | ICD-10-CM | POA: Diagnosis not present

## 2018-12-28 DIAGNOSIS — N186 End stage renal disease: Secondary | ICD-10-CM | POA: Diagnosis not present

## 2018-12-30 DIAGNOSIS — N186 End stage renal disease: Secondary | ICD-10-CM | POA: Diagnosis not present

## 2018-12-30 DIAGNOSIS — Z992 Dependence on renal dialysis: Secondary | ICD-10-CM | POA: Diagnosis not present

## 2018-12-30 DIAGNOSIS — N2581 Secondary hyperparathyroidism of renal origin: Secondary | ICD-10-CM | POA: Diagnosis not present

## 2018-12-30 DIAGNOSIS — D509 Iron deficiency anemia, unspecified: Secondary | ICD-10-CM | POA: Diagnosis not present

## 2018-12-30 DIAGNOSIS — E876 Hypokalemia: Secondary | ICD-10-CM | POA: Diagnosis not present

## 2018-12-30 DIAGNOSIS — D631 Anemia in chronic kidney disease: Secondary | ICD-10-CM | POA: Diagnosis not present

## 2018-12-31 DIAGNOSIS — T782XXS Anaphylactic shock, unspecified, sequela: Secondary | ICD-10-CM | POA: Insufficient documentation

## 2019-01-01 DIAGNOSIS — Z992 Dependence on renal dialysis: Secondary | ICD-10-CM | POA: Diagnosis not present

## 2019-01-01 DIAGNOSIS — N186 End stage renal disease: Secondary | ICD-10-CM | POA: Diagnosis not present

## 2019-01-01 DIAGNOSIS — N2581 Secondary hyperparathyroidism of renal origin: Secondary | ICD-10-CM | POA: Diagnosis not present

## 2019-01-01 DIAGNOSIS — D631 Anemia in chronic kidney disease: Secondary | ICD-10-CM | POA: Diagnosis not present

## 2019-01-01 DIAGNOSIS — E876 Hypokalemia: Secondary | ICD-10-CM | POA: Diagnosis not present

## 2019-01-01 DIAGNOSIS — D509 Iron deficiency anemia, unspecified: Secondary | ICD-10-CM | POA: Diagnosis not present

## 2019-01-04 DIAGNOSIS — N186 End stage renal disease: Secondary | ICD-10-CM | POA: Diagnosis not present

## 2019-01-04 DIAGNOSIS — E876 Hypokalemia: Secondary | ICD-10-CM | POA: Diagnosis not present

## 2019-01-04 DIAGNOSIS — N2581 Secondary hyperparathyroidism of renal origin: Secondary | ICD-10-CM | POA: Diagnosis not present

## 2019-01-04 DIAGNOSIS — D631 Anemia in chronic kidney disease: Secondary | ICD-10-CM | POA: Diagnosis not present

## 2019-01-04 DIAGNOSIS — D509 Iron deficiency anemia, unspecified: Secondary | ICD-10-CM | POA: Diagnosis not present

## 2019-01-04 DIAGNOSIS — Z992 Dependence on renal dialysis: Secondary | ICD-10-CM | POA: Diagnosis not present

## 2019-01-06 DIAGNOSIS — E876 Hypokalemia: Secondary | ICD-10-CM | POA: Diagnosis not present

## 2019-01-06 DIAGNOSIS — D509 Iron deficiency anemia, unspecified: Secondary | ICD-10-CM | POA: Diagnosis not present

## 2019-01-06 DIAGNOSIS — N2581 Secondary hyperparathyroidism of renal origin: Secondary | ICD-10-CM | POA: Diagnosis not present

## 2019-01-06 DIAGNOSIS — Z992 Dependence on renal dialysis: Secondary | ICD-10-CM | POA: Diagnosis not present

## 2019-01-06 DIAGNOSIS — N186 End stage renal disease: Secondary | ICD-10-CM | POA: Diagnosis not present

## 2019-01-06 DIAGNOSIS — D631 Anemia in chronic kidney disease: Secondary | ICD-10-CM | POA: Diagnosis not present

## 2019-01-08 DIAGNOSIS — D631 Anemia in chronic kidney disease: Secondary | ICD-10-CM | POA: Diagnosis not present

## 2019-01-08 DIAGNOSIS — D509 Iron deficiency anemia, unspecified: Secondary | ICD-10-CM | POA: Diagnosis not present

## 2019-01-08 DIAGNOSIS — N186 End stage renal disease: Secondary | ICD-10-CM | POA: Diagnosis not present

## 2019-01-08 DIAGNOSIS — Z992 Dependence on renal dialysis: Secondary | ICD-10-CM | POA: Diagnosis not present

## 2019-01-08 DIAGNOSIS — E876 Hypokalemia: Secondary | ICD-10-CM | POA: Diagnosis not present

## 2019-01-08 DIAGNOSIS — N2581 Secondary hyperparathyroidism of renal origin: Secondary | ICD-10-CM | POA: Diagnosis not present

## 2019-01-11 DIAGNOSIS — Z992 Dependence on renal dialysis: Secondary | ICD-10-CM | POA: Diagnosis not present

## 2019-01-11 DIAGNOSIS — D509 Iron deficiency anemia, unspecified: Secondary | ICD-10-CM | POA: Diagnosis not present

## 2019-01-11 DIAGNOSIS — D631 Anemia in chronic kidney disease: Secondary | ICD-10-CM | POA: Diagnosis not present

## 2019-01-11 DIAGNOSIS — N2581 Secondary hyperparathyroidism of renal origin: Secondary | ICD-10-CM | POA: Diagnosis not present

## 2019-01-11 DIAGNOSIS — N186 End stage renal disease: Secondary | ICD-10-CM | POA: Diagnosis not present

## 2019-01-11 DIAGNOSIS — E876 Hypokalemia: Secondary | ICD-10-CM | POA: Diagnosis not present

## 2019-01-13 DIAGNOSIS — N2581 Secondary hyperparathyroidism of renal origin: Secondary | ICD-10-CM | POA: Diagnosis not present

## 2019-01-13 DIAGNOSIS — E876 Hypokalemia: Secondary | ICD-10-CM | POA: Diagnosis not present

## 2019-01-13 DIAGNOSIS — D631 Anemia in chronic kidney disease: Secondary | ICD-10-CM | POA: Diagnosis not present

## 2019-01-13 DIAGNOSIS — N186 End stage renal disease: Secondary | ICD-10-CM | POA: Diagnosis not present

## 2019-01-13 DIAGNOSIS — Z992 Dependence on renal dialysis: Secondary | ICD-10-CM | POA: Diagnosis not present

## 2019-01-13 DIAGNOSIS — D509 Iron deficiency anemia, unspecified: Secondary | ICD-10-CM | POA: Diagnosis not present

## 2019-01-15 DIAGNOSIS — E876 Hypokalemia: Secondary | ICD-10-CM | POA: Diagnosis not present

## 2019-01-15 DIAGNOSIS — N186 End stage renal disease: Secondary | ICD-10-CM | POA: Diagnosis not present

## 2019-01-15 DIAGNOSIS — D631 Anemia in chronic kidney disease: Secondary | ICD-10-CM | POA: Diagnosis not present

## 2019-01-15 DIAGNOSIS — N2581 Secondary hyperparathyroidism of renal origin: Secondary | ICD-10-CM | POA: Diagnosis not present

## 2019-01-15 DIAGNOSIS — D509 Iron deficiency anemia, unspecified: Secondary | ICD-10-CM | POA: Diagnosis not present

## 2019-01-15 DIAGNOSIS — Z992 Dependence on renal dialysis: Secondary | ICD-10-CM | POA: Diagnosis not present

## 2019-01-18 DIAGNOSIS — D509 Iron deficiency anemia, unspecified: Secondary | ICD-10-CM | POA: Diagnosis not present

## 2019-01-18 DIAGNOSIS — E876 Hypokalemia: Secondary | ICD-10-CM | POA: Diagnosis not present

## 2019-01-18 DIAGNOSIS — N2581 Secondary hyperparathyroidism of renal origin: Secondary | ICD-10-CM | POA: Diagnosis not present

## 2019-01-18 DIAGNOSIS — D631 Anemia in chronic kidney disease: Secondary | ICD-10-CM | POA: Diagnosis not present

## 2019-01-18 DIAGNOSIS — N186 End stage renal disease: Secondary | ICD-10-CM | POA: Diagnosis not present

## 2019-01-18 DIAGNOSIS — Z992 Dependence on renal dialysis: Secondary | ICD-10-CM | POA: Diagnosis not present

## 2019-01-20 DIAGNOSIS — E876 Hypokalemia: Secondary | ICD-10-CM | POA: Diagnosis not present

## 2019-01-20 DIAGNOSIS — N186 End stage renal disease: Secondary | ICD-10-CM | POA: Diagnosis not present

## 2019-01-20 DIAGNOSIS — D631 Anemia in chronic kidney disease: Secondary | ICD-10-CM | POA: Diagnosis not present

## 2019-01-20 DIAGNOSIS — Z992 Dependence on renal dialysis: Secondary | ICD-10-CM | POA: Diagnosis not present

## 2019-01-20 DIAGNOSIS — N2581 Secondary hyperparathyroidism of renal origin: Secondary | ICD-10-CM | POA: Diagnosis not present

## 2019-01-20 DIAGNOSIS — D509 Iron deficiency anemia, unspecified: Secondary | ICD-10-CM | POA: Diagnosis not present

## 2019-01-22 DIAGNOSIS — D509 Iron deficiency anemia, unspecified: Secondary | ICD-10-CM | POA: Diagnosis not present

## 2019-01-22 DIAGNOSIS — D631 Anemia in chronic kidney disease: Secondary | ICD-10-CM | POA: Diagnosis not present

## 2019-01-22 DIAGNOSIS — Z992 Dependence on renal dialysis: Secondary | ICD-10-CM | POA: Diagnosis not present

## 2019-01-22 DIAGNOSIS — E876 Hypokalemia: Secondary | ICD-10-CM | POA: Diagnosis not present

## 2019-01-22 DIAGNOSIS — N186 End stage renal disease: Secondary | ICD-10-CM | POA: Diagnosis not present

## 2019-01-22 DIAGNOSIS — N2581 Secondary hyperparathyroidism of renal origin: Secondary | ICD-10-CM | POA: Diagnosis not present

## 2019-01-23 DIAGNOSIS — N186 End stage renal disease: Secondary | ICD-10-CM | POA: Diagnosis not present

## 2019-01-23 DIAGNOSIS — Z992 Dependence on renal dialysis: Secondary | ICD-10-CM | POA: Diagnosis not present

## 2019-01-23 DIAGNOSIS — T8612 Kidney transplant failure: Secondary | ICD-10-CM | POA: Diagnosis not present

## 2019-01-26 DIAGNOSIS — D631 Anemia in chronic kidney disease: Secondary | ICD-10-CM | POA: Diagnosis not present

## 2019-01-26 DIAGNOSIS — E875 Hyperkalemia: Secondary | ICD-10-CM | POA: Diagnosis not present

## 2019-01-26 DIAGNOSIS — D509 Iron deficiency anemia, unspecified: Secondary | ICD-10-CM | POA: Diagnosis not present

## 2019-01-26 DIAGNOSIS — Z992 Dependence on renal dialysis: Secondary | ICD-10-CM | POA: Diagnosis not present

## 2019-01-26 DIAGNOSIS — N186 End stage renal disease: Secondary | ICD-10-CM | POA: Diagnosis not present

## 2019-01-26 DIAGNOSIS — N2581 Secondary hyperparathyroidism of renal origin: Secondary | ICD-10-CM | POA: Diagnosis not present

## 2019-01-26 DIAGNOSIS — E876 Hypokalemia: Secondary | ICD-10-CM | POA: Diagnosis not present

## 2019-01-27 DIAGNOSIS — N2581 Secondary hyperparathyroidism of renal origin: Secondary | ICD-10-CM | POA: Diagnosis not present

## 2019-01-27 DIAGNOSIS — E876 Hypokalemia: Secondary | ICD-10-CM | POA: Diagnosis not present

## 2019-01-27 DIAGNOSIS — N186 End stage renal disease: Secondary | ICD-10-CM | POA: Diagnosis not present

## 2019-01-27 DIAGNOSIS — Z992 Dependence on renal dialysis: Secondary | ICD-10-CM | POA: Diagnosis not present

## 2019-01-27 DIAGNOSIS — D509 Iron deficiency anemia, unspecified: Secondary | ICD-10-CM | POA: Diagnosis not present

## 2019-01-27 DIAGNOSIS — E875 Hyperkalemia: Secondary | ICD-10-CM | POA: Diagnosis not present

## 2019-01-29 DIAGNOSIS — E876 Hypokalemia: Secondary | ICD-10-CM | POA: Diagnosis not present

## 2019-01-29 DIAGNOSIS — D509 Iron deficiency anemia, unspecified: Secondary | ICD-10-CM | POA: Diagnosis not present

## 2019-01-29 DIAGNOSIS — N2581 Secondary hyperparathyroidism of renal origin: Secondary | ICD-10-CM | POA: Diagnosis not present

## 2019-01-29 DIAGNOSIS — Z992 Dependence on renal dialysis: Secondary | ICD-10-CM | POA: Diagnosis not present

## 2019-01-29 DIAGNOSIS — E875 Hyperkalemia: Secondary | ICD-10-CM | POA: Diagnosis not present

## 2019-01-29 DIAGNOSIS — N186 End stage renal disease: Secondary | ICD-10-CM | POA: Diagnosis not present

## 2019-02-01 ENCOUNTER — Telehealth: Payer: Self-pay | Admitting: Family Medicine

## 2019-02-01 DIAGNOSIS — D509 Iron deficiency anemia, unspecified: Secondary | ICD-10-CM | POA: Diagnosis not present

## 2019-02-01 DIAGNOSIS — N186 End stage renal disease: Secondary | ICD-10-CM | POA: Diagnosis not present

## 2019-02-01 DIAGNOSIS — N2581 Secondary hyperparathyroidism of renal origin: Secondary | ICD-10-CM | POA: Diagnosis not present

## 2019-02-01 DIAGNOSIS — E876 Hypokalemia: Secondary | ICD-10-CM | POA: Diagnosis not present

## 2019-02-01 DIAGNOSIS — E875 Hyperkalemia: Secondary | ICD-10-CM | POA: Diagnosis not present

## 2019-02-01 DIAGNOSIS — Z992 Dependence on renal dialysis: Secondary | ICD-10-CM | POA: Diagnosis not present

## 2019-02-01 NOTE — Telephone Encounter (Signed)
I left a message asking the patient to call and schedule Medicare AWV with Courtney ([facility]).  If patient calls back, please schedule Medicare Wellness Visit (initial) at next available opening.  [facility] ([place]ee[place]Dee) 

## 2019-02-03 DIAGNOSIS — D509 Iron deficiency anemia, unspecified: Secondary | ICD-10-CM | POA: Diagnosis not present

## 2019-02-03 DIAGNOSIS — N2581 Secondary hyperparathyroidism of renal origin: Secondary | ICD-10-CM | POA: Diagnosis not present

## 2019-02-03 DIAGNOSIS — E875 Hyperkalemia: Secondary | ICD-10-CM | POA: Diagnosis not present

## 2019-02-03 DIAGNOSIS — E876 Hypokalemia: Secondary | ICD-10-CM | POA: Diagnosis not present

## 2019-02-03 DIAGNOSIS — N186 End stage renal disease: Secondary | ICD-10-CM | POA: Diagnosis not present

## 2019-02-03 DIAGNOSIS — Z992 Dependence on renal dialysis: Secondary | ICD-10-CM | POA: Diagnosis not present

## 2019-02-05 DIAGNOSIS — N186 End stage renal disease: Secondary | ICD-10-CM | POA: Diagnosis not present

## 2019-02-05 DIAGNOSIS — D509 Iron deficiency anemia, unspecified: Secondary | ICD-10-CM | POA: Diagnosis not present

## 2019-02-05 DIAGNOSIS — E875 Hyperkalemia: Secondary | ICD-10-CM | POA: Diagnosis not present

## 2019-02-05 DIAGNOSIS — Z992 Dependence on renal dialysis: Secondary | ICD-10-CM | POA: Diagnosis not present

## 2019-02-05 DIAGNOSIS — N2581 Secondary hyperparathyroidism of renal origin: Secondary | ICD-10-CM | POA: Diagnosis not present

## 2019-02-05 DIAGNOSIS — E876 Hypokalemia: Secondary | ICD-10-CM | POA: Diagnosis not present

## 2019-02-08 DIAGNOSIS — D509 Iron deficiency anemia, unspecified: Secondary | ICD-10-CM | POA: Diagnosis not present

## 2019-02-08 DIAGNOSIS — E876 Hypokalemia: Secondary | ICD-10-CM | POA: Diagnosis not present

## 2019-02-08 DIAGNOSIS — E875 Hyperkalemia: Secondary | ICD-10-CM | POA: Diagnosis not present

## 2019-02-08 DIAGNOSIS — N2581 Secondary hyperparathyroidism of renal origin: Secondary | ICD-10-CM | POA: Diagnosis not present

## 2019-02-08 DIAGNOSIS — Z992 Dependence on renal dialysis: Secondary | ICD-10-CM | POA: Diagnosis not present

## 2019-02-08 DIAGNOSIS — N186 End stage renal disease: Secondary | ICD-10-CM | POA: Diagnosis not present

## 2019-02-10 DIAGNOSIS — E876 Hypokalemia: Secondary | ICD-10-CM | POA: Diagnosis not present

## 2019-02-10 DIAGNOSIS — Z992 Dependence on renal dialysis: Secondary | ICD-10-CM | POA: Diagnosis not present

## 2019-02-10 DIAGNOSIS — D509 Iron deficiency anemia, unspecified: Secondary | ICD-10-CM | POA: Diagnosis not present

## 2019-02-10 DIAGNOSIS — N2581 Secondary hyperparathyroidism of renal origin: Secondary | ICD-10-CM | POA: Diagnosis not present

## 2019-02-10 DIAGNOSIS — E875 Hyperkalemia: Secondary | ICD-10-CM | POA: Diagnosis not present

## 2019-02-10 DIAGNOSIS — N186 End stage renal disease: Secondary | ICD-10-CM | POA: Diagnosis not present

## 2019-02-12 DIAGNOSIS — N2581 Secondary hyperparathyroidism of renal origin: Secondary | ICD-10-CM | POA: Diagnosis not present

## 2019-02-12 DIAGNOSIS — E876 Hypokalemia: Secondary | ICD-10-CM | POA: Diagnosis not present

## 2019-02-12 DIAGNOSIS — Z992 Dependence on renal dialysis: Secondary | ICD-10-CM | POA: Diagnosis not present

## 2019-02-12 DIAGNOSIS — D509 Iron deficiency anemia, unspecified: Secondary | ICD-10-CM | POA: Diagnosis not present

## 2019-02-12 DIAGNOSIS — E875 Hyperkalemia: Secondary | ICD-10-CM | POA: Diagnosis not present

## 2019-02-12 DIAGNOSIS — N186 End stage renal disease: Secondary | ICD-10-CM | POA: Diagnosis not present

## 2019-02-14 ENCOUNTER — Telehealth: Payer: Self-pay

## 2019-02-14 DIAGNOSIS — N2581 Secondary hyperparathyroidism of renal origin: Secondary | ICD-10-CM | POA: Diagnosis not present

## 2019-02-14 DIAGNOSIS — E876 Hypokalemia: Secondary | ICD-10-CM | POA: Diagnosis not present

## 2019-02-14 DIAGNOSIS — Z992 Dependence on renal dialysis: Secondary | ICD-10-CM | POA: Diagnosis not present

## 2019-02-14 DIAGNOSIS — E875 Hyperkalemia: Secondary | ICD-10-CM | POA: Diagnosis not present

## 2019-02-14 DIAGNOSIS — N186 End stage renal disease: Secondary | ICD-10-CM | POA: Diagnosis not present

## 2019-02-14 DIAGNOSIS — D509 Iron deficiency anemia, unspecified: Secondary | ICD-10-CM | POA: Diagnosis not present

## 2019-02-14 NOTE — Telephone Encounter (Signed)
Received certification of medical necessity for oxygen renewal (from [facility]) for patient to be filled out and signed by Dr. Rogers Blocker. Patient has not been seen since 10/05/17; Dr. Rogers Blocker cannot sign this ppw unless patient comes in for in office visit to be seen.  This information has been communicated to [facility] @ [phone].

## 2019-02-16 DIAGNOSIS — N186 End stage renal disease: Secondary | ICD-10-CM | POA: Diagnosis not present

## 2019-02-16 DIAGNOSIS — E876 Hypokalemia: Secondary | ICD-10-CM | POA: Diagnosis not present

## 2019-02-16 DIAGNOSIS — Z992 Dependence on renal dialysis: Secondary | ICD-10-CM | POA: Diagnosis not present

## 2019-02-16 DIAGNOSIS — N2581 Secondary hyperparathyroidism of renal origin: Secondary | ICD-10-CM | POA: Diagnosis not present

## 2019-02-16 DIAGNOSIS — E875 Hyperkalemia: Secondary | ICD-10-CM | POA: Diagnosis not present

## 2019-02-16 DIAGNOSIS — D509 Iron deficiency anemia, unspecified: Secondary | ICD-10-CM | POA: Diagnosis not present

## 2019-02-19 DIAGNOSIS — E875 Hyperkalemia: Secondary | ICD-10-CM | POA: Diagnosis not present

## 2019-02-19 DIAGNOSIS — Z992 Dependence on renal dialysis: Secondary | ICD-10-CM | POA: Diagnosis not present

## 2019-02-19 DIAGNOSIS — N2581 Secondary hyperparathyroidism of renal origin: Secondary | ICD-10-CM | POA: Diagnosis not present

## 2019-02-19 DIAGNOSIS — E876 Hypokalemia: Secondary | ICD-10-CM | POA: Diagnosis not present

## 2019-02-19 DIAGNOSIS — D509 Iron deficiency anemia, unspecified: Secondary | ICD-10-CM | POA: Diagnosis not present

## 2019-02-19 DIAGNOSIS — N186 End stage renal disease: Secondary | ICD-10-CM | POA: Diagnosis not present

## 2019-03-09 ENCOUNTER — Other Ambulatory Visit: Payer: Self-pay

## 2019-03-09 ENCOUNTER — Emergency Department (HOSPITAL_COMMUNITY): Payer: Medicare Other

## 2019-03-09 ENCOUNTER — Inpatient Hospital Stay (HOSPITAL_COMMUNITY)
Admission: EM | Admit: 2019-03-09 | Discharge: 2019-03-12 | DRG: 377 | Disposition: A | Discharge status: Home / Self Care | Payer: Medicare Other | Attending: Internal Medicine | Admitting: Internal Medicine

## 2019-03-09 ENCOUNTER — Encounter (HOSPITAL_COMMUNITY): Payer: Self-pay

## 2019-03-09 DIAGNOSIS — E785 Hyperlipidemia, unspecified: Secondary | ICD-10-CM | POA: Diagnosis present

## 2019-03-09 DIAGNOSIS — Z940 Kidney transplant status: Secondary | ICD-10-CM | POA: Diagnosis not present

## 2019-03-09 DIAGNOSIS — Z823 Family history of stroke: Secondary | ICD-10-CM | POA: Diagnosis not present

## 2019-03-09 DIAGNOSIS — Z8051 Family history of malignant neoplasm of kidney: Secondary | ICD-10-CM | POA: Diagnosis not present

## 2019-03-09 DIAGNOSIS — N186 End stage renal disease: Secondary | ICD-10-CM | POA: Diagnosis present

## 2019-03-09 DIAGNOSIS — I425 Other restrictive cardiomyopathy: Secondary | ICD-10-CM | POA: Diagnosis present

## 2019-03-09 DIAGNOSIS — I5032 Chronic diastolic (congestive) heart failure: Secondary | ICD-10-CM | POA: Diagnosis present

## 2019-03-09 DIAGNOSIS — D62 Acute posthemorrhagic anemia: Secondary | ICD-10-CM | POA: Diagnosis present

## 2019-03-09 DIAGNOSIS — E875 Hyperkalemia: Secondary | ICD-10-CM | POA: Diagnosis not present

## 2019-03-09 DIAGNOSIS — Z881 Allergy status to other antibiotic agents status: Secondary | ICD-10-CM

## 2019-03-09 DIAGNOSIS — Z8249 Family history of ischemic heart disease and other diseases of the circulatory system: Secondary | ICD-10-CM | POA: Diagnosis not present

## 2019-03-09 DIAGNOSIS — Y830 Surgical operation with transplant of whole organ as the cause of abnormal reaction of the patient, or of later complication, without mention of misadventure at the time of the procedure: Secondary | ICD-10-CM | POA: Diagnosis present

## 2019-03-09 DIAGNOSIS — K5792 Diverticulitis of intestine, part unspecified, without perforation or abscess without bleeding: Secondary | ICD-10-CM | POA: Diagnosis not present

## 2019-03-09 DIAGNOSIS — T8612 Kidney transplant failure: Secondary | ICD-10-CM | POA: Diagnosis present

## 2019-03-09 DIAGNOSIS — Z820 Family history of epilepsy and other diseases of the nervous system: Secondary | ICD-10-CM | POA: Diagnosis not present

## 2019-03-09 DIAGNOSIS — K922 Gastrointestinal hemorrhage, unspecified: Secondary | ICD-10-CM | POA: Diagnosis not present

## 2019-03-09 DIAGNOSIS — Z7952 Long term (current) use of systemic steroids: Secondary | ICD-10-CM | POA: Diagnosis not present

## 2019-03-09 DIAGNOSIS — Z79899 Other long term (current) drug therapy: Secondary | ICD-10-CM | POA: Diagnosis not present

## 2019-03-09 DIAGNOSIS — I43 Cardiomyopathy in diseases classified elsewhere: Secondary | ICD-10-CM | POA: Diagnosis present

## 2019-03-09 DIAGNOSIS — E854 Organ-limited amyloidosis: Secondary | ICD-10-CM | POA: Diagnosis present

## 2019-03-09 DIAGNOSIS — Z992 Dependence on renal dialysis: Secondary | ICD-10-CM

## 2019-03-09 DIAGNOSIS — N2581 Secondary hyperparathyroidism of renal origin: Secondary | ICD-10-CM | POA: Diagnosis present

## 2019-03-09 DIAGNOSIS — K921 Melena: Secondary | ICD-10-CM | POA: Diagnosis present

## 2019-03-09 DIAGNOSIS — K5733 Diverticulitis of large intestine without perforation or abscess with bleeding: Secondary | ICD-10-CM | POA: Diagnosis present

## 2019-03-09 DIAGNOSIS — D631 Anemia in chronic kidney disease: Secondary | ICD-10-CM | POA: Diagnosis present

## 2019-03-09 DIAGNOSIS — Z20828 Contact with and (suspected) exposure to other viral communicable diseases: Secondary | ICD-10-CM | POA: Diagnosis present

## 2019-03-09 DIAGNOSIS — I132 Hypertensive heart and chronic kidney disease with heart failure and with stage 5 chronic kidney disease, or end stage renal disease: Secondary | ICD-10-CM | POA: Diagnosis present

## 2019-03-09 LAB — CBC
HCT: 31 % — ABNORMAL LOW (ref 39.0–52.0)
Hemoglobin: 9.3 g/dL — ABNORMAL LOW (ref 13.0–17.0)
MCH: 28 pg (ref 26.0–34.0)
MCHC: 30 g/dL (ref 30.0–36.0)
MCV: 93.4 fL (ref 80.0–100.0)
Platelets: 242 10*3/uL (ref 150–400)
RBC: 3.32 MIL/uL — ABNORMAL LOW (ref 4.22–5.81)
RDW: 14.1 % (ref 11.5–15.5)
WBC: 8 10*3/uL (ref 4.0–10.5)
nRBC: 0 % (ref 0.0–0.2)

## 2019-03-09 LAB — COMPREHENSIVE METABOLIC PANEL
ALT: 14 U/L (ref 0–44)
AST: 15 U/L (ref 15–41)
Albumin: 3.4 g/dL — ABNORMAL LOW (ref 3.5–5.0)
Alkaline Phosphatase: 78 U/L (ref 38–126)
Anion gap: 11 (ref 5–15)
BUN: 28 mg/dL — ABNORMAL HIGH (ref 6–20)
CO2: 25 mmol/L (ref 22–32)
Calcium: 8.8 mg/dL — ABNORMAL LOW (ref 8.9–10.3)
Chloride: 106 mmol/L (ref 98–111)
Creatinine, Ser: 8.49 mg/dL — ABNORMAL HIGH (ref 0.61–1.24)
GFR calc Af Amer: 7 mL/min — ABNORMAL LOW (ref >60)
GFR calc non Af Amer: 6 mL/min — ABNORMAL LOW (ref >60)
Glucose, Bld: 128 mg/dL — ABNORMAL HIGH (ref 70–99)
Potassium: 4.6 mmol/L (ref 3.5–5.1)
Sodium: 142 mmol/L (ref 135–145)
Total Bilirubin: 0.4 mg/dL (ref 0.3–1.2)
Total Protein: 6.8 g/dL (ref 6.5–8.1)

## 2019-03-09 LAB — POC OCCULT BLOOD, ED: Fecal Occult Bld: POSITIVE — AB

## 2019-03-09 MED ORDER — MORPHINE SULFATE (PF) 2 MG/ML IV SOLN
2.0000 mg | Freq: Once | INTRAVENOUS | Status: AC
  Administered 2019-03-09: 2 mg via INTRAVENOUS
  Filled 2019-03-09: qty 1

## 2019-03-09 MED ORDER — ONDANSETRON HCL 4 MG/2ML IJ SOLN
4.0000 mg | Freq: Once | INTRAMUSCULAR | Status: AC
  Administered 2019-03-09: 20:00:00 4 mg via INTRAVENOUS
  Filled 2019-03-09: qty 2

## 2019-03-09 MED ORDER — METRONIDAZOLE IN NACL 5-0.79 MG/ML-% IV SOLN
500.0000 mg | Freq: Once | INTRAVENOUS | Status: AC
  Administered 2019-03-09: 22:00:00 500 mg via INTRAVENOUS
  Filled 2019-03-09: qty 100

## 2019-03-09 MED ORDER — IOHEXOL 300 MG/ML  SOLN
100.0000 mL | Freq: Once | INTRAMUSCULAR | Status: AC | PRN
  Administered 2019-03-09: 100 mL via INTRAVENOUS

## 2019-03-09 MED ORDER — SODIUM CHLORIDE 0.9 % IV SOLN: Freq: Once | INTRAVENOUS | Status: AC

## 2019-03-09 MED ORDER — PIPERACILLIN-TAZOBACTAM IN DEX 2-0.25 GM/50ML IV SOLN
2.2500 g | Freq: Three times a day (TID) | INTRAVENOUS | Status: DC
  Administered 2019-03-10 – 2019-03-12 (×6): 2.25 g via INTRAVENOUS
  Filled 2019-03-09 (×9): qty 50

## 2019-03-09 MED ORDER — SODIUM CHLORIDE 0.9 % IV SOLN
2.0000 g | Freq: Once | INTRAVENOUS | Status: AC
  Administered 2019-03-09: 2 g via INTRAVENOUS
  Filled 2019-03-09 (×2): qty 20

## 2019-03-09 MED ORDER — PANTOPRAZOLE SODIUM 40 MG IV SOLR
40.0000 mg | Freq: Once | INTRAVENOUS | Status: AC
  Administered 2019-03-09: 20:00:00 40 mg via INTRAVENOUS
  Filled 2019-03-09: qty 40

## 2019-03-09 NOTE — ED Provider Notes (Signed)
[facility] EMERGENCY DEPARTMENT Provider Note   CSN: [id number] Arrival date & time: 03/09/19  1837     History Chief Complaint  Patient presents with  . Melena    Ryan [name] is a 58 y.o. male past medical history of ESRD, dialysis patient, Tuesday, Thursday, Saturday), hyperlipidemia, hypertension who presents for evaluation of rectal bleeding that began this morning.  He reports multiple episodes of diarrhea with bright red blood noted.  He cannot tell me specifically how he has had but he said he has had multiple.  He states that he has not noted any black or tarry stools.  He states he has a little bit of discomfort in his left lower quadrant abdomen when he has bowel movements.  Otherwise denies any abdominal pain.  He states he is not currently on any blood thinners.  He states that this is never had happened to him before.  He states he has never had a colonoscopy.  He states he has not had any fevers, chest pain, difficulty breathing, nausea/vomiting, urinary complaints.  His last dialysis session was yesterday.  The history is provided by the patient.       Past Medical History:  Diagnosis Date  . Allergy   . Blood transfusion without reported diagnosis   . Dialysis patient ([place])    Tues,thurs,sat  . ESRD (end stage renal disease) ([place])   . Hyperlipidemia   . Hypertension     Patient Active Problem List   Diagnosis Date Noted  . Acute lower GI bleeding 03/09/2019  . Acute respiratory failure with hypoxia ([place]) 12/25/2017  . Cough 10/30/2017  . 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 Chronic diastolic heart failure, NYHA class 3 ([place]) 10/18/2017  . Acute pulmonary edema ([facility])   . Hyperlipidemia 09/19/2017  . Shortness of breath 09/19/2017  . Acute respiratory distress 09/19/2017  . PND (paroxysmal nocturnal dyspnea) 09/14/2017  . DOE (dyspnea on exertion) 09/14/2017  . Chest pain with moderate risk for  cardiac etiology 09/14/2017  . Recurrent right pleural effusion 08/28/2017  . ESRD on dialysis ([place]) 08/27/2017  . Bloody drainage from penis 08/27/2017  . Lesion of vertebra 07/13/2017  . Renal transplant, status post 08/26/2016  . Hypertension 08/26/2016  . Anemia of chronic disease 04/09/2016  . EKG abnormalities   . 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 A/V FISTULAGRAM Left 12/15/2016   Procedure: A/V Fistulagram - left;  Surgeon: Angelia Mould, MD;  Location: [facility];  Service: Cardiovascular;  Laterality: Left;  . AV FISTULA PLACEMENT Left 08/28/2016   Procedure: LEFT UPPER  ARM ARTERIOVENOUS (AV) FISTULA CREATION;  Surgeon: Angelia Mould, MD;  Location: [facility];  Service: Vascular;  Laterality: Left;  . DIALYSIS/PERMA CATHETER INSERTION Right 12/21/2017   Procedure: INSERTION OF DIALYSIS CATHETER Right Internal Jugular .;  Surgeon: Elam Dutch, MD;  Location: [facility];  Service: Vascular;  Laterality: Right;  . FISTULA SUPERFICIALIZATION Left 09/23/2017   Procedure: FISTULA PLICATION LEFT ARM;  Surgeon: Elam Dutch, MD;  Location: [facility];  Service: Vascular;  Laterality: Left;  . FISTULOGRAM Left 12/21/2017   Procedure: FISTULOGRAM with Balloon Angioplasty.;  Surgeon: Elam Dutch, MD;  Location: [facility];  Service: Vascular;  Laterality: Left;  . IR THORACENTESIS ASP PLEURAL SPACE W/IMG GUIDE  08/22/2017   1.2 L -right-sided  . IR THORACENTESIS ASP PLEURAL SPACE W/IMG GUIDE  10/19/2017  .  REVISON OF ARTERIOVENOUS FISTULA Left [id number]   Procedure: PLICATION and Ligation of F LEFT ARM ARTERIOVENOUS FISTULA;  Surgeon: Elam Dutch, MD;  Location: [facility] OR;  Service: Vascular;  Laterality: Left;  . stent in kidneys     dec 2017  . TRANSTHORACIC ECHOCARDIOGRAM  09/22/2017    Severe LVH.  Normal function -EF 55-60%.  GRII DD.  Moderate RV dilation with mildly reduced RV function.   Fixed  right coronary cusp with very mild aortic stenosis.  The myocardium has a speckled appearance --> .   Recommend cardiac MRI to evaluate for amyloid       Family History  Problem Relation Age of Onset  . Hypertension Mother   . Heart disease Mother 52       By his report, he thinks that she had heart attack.  . Stroke Mother   . Cancer Father   . Kidney cancer Father   . Hypertension Sister   . Heart disease Maternal Grandmother   . Alcohol abuse Maternal Grandfather   . Mental illness Paternal Grandmother   . Learning disabilities Paternal Grandmother        Alzheimer's   . Stroke Paternal Grandfather   . Colon cancer Neg Hx   . Colon polyps Neg Hx   . Esophageal cancer Neg Hx   . Rectal cancer Neg Hx   . Stomach cancer Neg Hx     Social History   Tobacco Use  . Smoking status: Never Smoker  . Smokeless tobacco: Never Used  Substance Use Topics  . Alcohol use: No  . Drug use: No    Home Medications Prior to Admission medications   Medication Sig Start Date End Date Taking? Authorizing Provider  azelastine (ASTELIN) 0.1 % nasal spray PLACE 1 SPRAY INTO BOTH NOSTRILS 2 (TWO) TIMES DAILY. USE IN EACH NOSTRIL AS DIRECTED 07/19/18   Mannam, Praveen, MD  chlorpheniramine (CHLOR-TRIMETON) 4 MG tablet Take 2 tablets (8 mg total) by mouth every 6 (six) hours as needed for allergies. 12/29/17   Hongalgi, Lenis Dickinson, MD  cinacalcet (SENSIPAR) 30 MG tablet Take 30 mg by mouth daily. 12/08/17   [provider]  cloNIDine (CATAPRES) 0.1 MG tablet Take 1 tablet (0.1 mg total) by mouth 2 (two) times daily. 09/24/17   Sheikh, Omair Latif, DO  fluticasone (FLONASE) 50 MCG/ACT nasal spray SPRAY 2 SPRAYS INTO EACH NOSTRIL EVERY DAY 01/11/18   Mannam, Praveen, MD  furosemide (LASIX) 80 MG tablet TAKE 1 TABLET (80 MG TOTAL) BY MOUTH 2 (TWO) TIMES DAILY. 03/29/18   Orma Flaming, MD  guaiFENesin (MUCINEX) 600 MG 12 hr tablet Take 2 tablets (1,200 mg total) by mouth 2 (two) times daily as  needed for cough. Patient not taking: Reported on 03/09/2019 12/29/17   Modena Jansky, MD  labetalol (NORMODYNE) 200 MG tablet Take 1.5 tablets (300 mg total) by mouth 2 (two) times daily. 04/12/18   Leonie Man, MD  labetalol (NORMODYNE) 300 MG tablet TAKE 1 TABLET (300 MG TOTAL) BY MOUTH 2 (TWO) TIMES DAILY. OFFICE VISIT NEEDED 11/21/18   Leonie Man, MD  lidocaine-prilocaine (EMLA) cream Apply 1 application topically once.  11/26/17   [provider]  LOKELMA 10 g PACK packet Take 1 packet by mouth 3 (three) times a week. 02/01/19   [provider]  loratadine (CLARITIN) 10 MG tablet Take 10 mg by mouth daily as needed for allergies.    [provider]  losartan (COZAAR) 50 MG tablet  Take 1 tablet (50 mg total) by mouth daily. 04/12/18   Leonie Man, MD  multivitamin (RENA-VIT) TABS tablet Take 1 tablet by mouth daily.    [provider]  NIFEdipine (PROCARDIA XL/ADALAT-CC) 60 MG 24 hr tablet Take 60 mg by mouth 2 (two) times daily.    [provider]  omeprazole (PRILOSEC) 20 MG capsule TAKE 1 CAPSULE BY MOUTH EVERY DAY 08/10/18   Mannam, Praveen, MD  oxyCODONE-acetaminophen (PERCOCET) 5-325 MG tablet Take 1 tablet by mouth every 6 (six) hours as needed for severe pain. Patient not taking: Reported on 03/09/2019 12/21/17   Gabriel Earing, PA-C  pravastatin (PRAVACHOL) 80 MG tablet Take 1 tablet (80 mg total) by mouth at bedtime. 12/02/17   Orma Flaming, MD  predniSONE (DELTASONE) 5 MG tablet Take 5 mg by mouth daily with breakfast.    [provider]  sevelamer carbonate (RENVELA) 800 MG tablet Take 1 tablet (800 mg total) by mouth 3 (three) times daily with meals. 09/24/17   Raiford Noble Latif, DO  tacrolimus (PROGRAF) 5 MG capsule Take 5 mg by mouth 2 (two) times daily.    [provider]    Allergies    Azithromycin  Review of Systems   Review of Systems  Physical Exam Updated Vital Signs BP (!) 194/121    Pulse (!) 101   Temp 98.3 F (36.8 C) (Oral)   Resp 18   Ht 5\' 10"  (1.778 m)   Wt 81.6 kg   SpO2 96%   BMI 25.83 kg/m   Physical Exam Vitals and nursing note reviewed. Exam conducted with a chaperone present.  Constitutional:      Appearance: Normal appearance. He is well-developed.  HENT:     Head: Normocephalic and atraumatic.  Eyes:     General: Lids are normal.     Conjunctiva/sclera: Conjunctivae normal.     Pupils: Pupils are equal, round, and reactive to light.  Cardiovascular:     Rate and Rhythm: Regular rhythm. Tachycardia present.     Pulses: Normal pulses.     Heart sounds: Normal heart sounds. No murmur. No friction rub. No gallop.   Pulmonary:     Effort: Pulmonary effort is normal.     Breath sounds: Normal breath sounds.  Abdominal:     Palpations: Abdomen is soft. Abdomen is not rigid.     Tenderness: There is abdominal tenderness in the left lower quadrant. There is no guarding.  Genitourinary:    Rectum: Guaiac result positive.     Comments: The exam was performed with a chaperone present.  Bright red blood noted on DRE.  No melena. Musculoskeletal:        General: Normal range of motion.     Cervical back: Full passive range of motion without pain.  Skin:    General: Skin is warm and dry.     Capillary Refill: Capillary refill takes less than 2 seconds.     Coloration: Skin is pale.  Neurological:     Mental Status: He is alert and oriented to person, place, and time.  Psychiatric:        Speech: Speech normal.     ED Results / Procedures / Treatments   Labs (all labs ordered are listed, but only abnormal results are displayed) Labs Reviewed  COMPREHENSIVE METABOLIC PANEL - Abnormal; Notable for the following components:      Result Value   Glucose, Bld 128 (*)    BUN 28 (*)  Creatinine, Ser 8.49 (*)    Calcium 8.8 (*)    Albumin 3.4 (*)    GFR calc non Af Amer 6 (*)    GFR calc Af Amer 7 (*)    All other components within normal  limits  CBC - Abnormal; Notable for the following components:   RBC 3.32 (*)    Hemoglobin 9.3 (*)    HCT 31.0 (*)    All other components within normal limits  POC OCCULT BLOOD, ED - Abnormal; Notable for the following components:   Fecal Occult Bld POSITIVE (*)    All other components within normal limits  SARS CORONAVIRUS 2 (TAT 6-24 HRS)  TYPE AND SCREEN    EKG None  Radiology CT ABDOMEN PELVIS W CONTRAST  Result Date: 03/09/2019 CLINICAL DATA:  Generalized abdominal pain EXAM: CT ABDOMEN AND PELVIS WITH CONTRAST TECHNIQUE: Multidetector CT imaging of the abdomen and pelvis was performed using the standard protocol following bolus administration of intravenous contrast. CONTRAST:  122mL OMNIPAQUE IOHEXOL 300 MG/ML  SOLN COMPARISON:  CT 07/07/2017 FINDINGS: Lower chest: No acute abnormality. Hepatobiliary: No focal liver abnormality is seen. No gallstones, gallbladder wall thickening, or biliary dilatation. Pancreas: Unremarkable. No pancreatic ductal dilatation or surrounding inflammatory changes. Spleen: Normal in size without focal abnormality. Adrenals/Urinary Tract: Bilateral adrenal glands are unremarkable. Chronic atrophy of the bilateral native kidneys without hydronephrosis. Simple cyst at the superior pole of the native right kidney. Ureters are unremarkable. The transplant kidney within the right lower quadrant has decreased in size compared to prior study. Heterogeneous appearance of the transplant kidney with cortical calcifications. There is a nephroureteral stent in place. Urinary bladder is circumferentially thickened and irregular in appearance with adjacent fat stranding. Stomach/Bowel: Stomach is within normal limits. Appendix not clearly visualized and may be surgically absent. Scattered colonic diverticulosis. Mildly thickened, hyperemic appearance of the rectosigmoid colon, which may reflect a nonspecific colitis. Additionally, there is a thickened segment of transverse  colon near the hepatic flexure with subtle pericolonic fat stranding and multiple mildly prominent diverticula (series 5, images 80-83). No dilated loops of bowel. Vascular/Lymphatic: Aortic atherosclerosis. No enlarged abdominal or pelvic lymph nodes. Reproductive: Prostate is unremarkable. Other: No abdominal wall hernia or abnormality. No abdominopelvic ascites. Musculoskeletal: Mild diffusely sclerotic appearance of the osseous structures suggesting renal osteodystrophy. Lucent changes within the L2 vertebral body are less prominent compared to prior. Scattered Schmorl's nodes. IMPRESSION: 1. Thickened segment of transverse colon near the hepatic flexure with subtle pericolonic fat stranding and multiple mildly prominent diverticula, suggestive of early acute diverticulitis. 2. Circumferentially thickened, hyperemic appearance of the rectosigmoid colon suggesting a nonspecific colitis. 3. 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 4. Thickened appearance of the urinary bladder, which could reflect cystitis. 5. Aortic atherosclerosis. 6. Mild diffusely sclerotic appearance of the osseous structures suggesting renal osteodystrophy. Aortic Atherosclerosis (ICD10-I70.0). Electronically Signed   By: Davina Poke M.D.   On: 03/09/2019 20:29    Procedures Procedures (including critical care time)  Medications Ordered in ED Medications  cefTRIAXone (ROCEPHIN) 2 g in sodium chloride 0.9 % 100 mL IVPB (0 g Intravenous Stopped 03/09/19 2256)    And  metroNIDAZOLE (FLAGYL) IVPB 500 mg (500 mg Intravenous New Bag/Given 03/09/19 2220)  pantoprazole (PROTONIX) injection 40 mg (40 mg Intravenous Given 03/09/19 2014)  0.9 %  sodium chloride infusion ( Intravenous Stopped 03/09/19 2234)  morphine 2 MG/ML injection 2 mg (2 mg Intravenous Given 03/09/19 2014)  ondansetron (ZOFRAN) injection 4  mg (4 mg Intravenous Given 03/09/19 2014)  iohexol (OMNIPAQUE) 300  MG/ML solution 100 mL (100 mLs Intravenous Contrast Given 03/09/19 1948)    ED Course  I have reviewed the triage vital signs and the nursing notes.  Pertinent labs & imaging results that were available during my care of the patient were reviewed by me and considered in my medical decision making (see chart for details).    MDM Rules/Calculators/A&P                      58 year old male past medical history of ESRD (Tuesday, Thursday, Saturday dialysis patient who presents for evaluation of rectal bleeding began today.  He reports multiple episodes of diarrhea that had bright red blood.  No black tarry stools.  He has had some additional left lower quadrant abdominal pain.  No fevers.  He is not currently on blood thinners.  He states he is never followed up with GI and has never had a colonoscopy.  Initially at arrival, he is tachycardic, slightly hypertensive.  Vitals otherwise stable.  On exam, he has tenderness palpation in the left lower quadrant.  On digital rectal exam, he had bright red blood.  No evidence of melena.  Discern for infectious etiology such as diverticulitis.  Low suspicion for GI bleed but also consideration.  Plan to check labs, CT abdomen pelvis.  Patient is fecal occult positive.  CBC shows no leukocytosis.  Hemoglobin is 9.3.  His baseline is around 10.  CMP shows BUN of 28, creatinine of 8.49.  CT on pelvis shows findings consistent with diverticulitis.  He has some additional nonspecific colitis findings.   Discussed results with patient.  He states he had another episode where he had bright red blood here in the emergency department.  Given his risk factors, continued bleeding as well as drop in hemoglobin, feel that admission with IV antibiotics for diverticulitis would be best.  We will plan for admission.  Discussed patient with Dr. Myna Hidalgo. Plan for admission.   Portions of this note were generated with [employer]. Dictation errors may occur despite  best attempts at proofreading.   Final Clinical Impression(s) / ED Diagnoses Final diagnoses:  Diverticulitis    Rx / DC Orders ED Discharge Orders    None       Desma Mcgregor 03/09/19 2259    Isla Pence, MD 03/09/19 2316

## 2019-03-09 NOTE — Progress Notes (Signed)
Pharmacy Antibiotic Note  Ryan [name] is a 58 y.o. male admitted on 03/09/2019 with intra-abdominal infection.  Pharmacy has been consulted for Zosyn dosing. ESRD on HD TTSa - last dose 12/15 on schedule PTA. Patient received 1x doses of ceftriaxone/Flagyl in the ER today.  Plan: Zosyn 2.25g IV q8h Monitor clinical progress, c/s, abx plan/LOT F/u HD schedule/tolerance inpatient   Height: 5\' 10"  (177.8 cm) Weight: 180 lb (81.6 kg) IBW/kg (Calculated) : 73  Temp (24hrs), Avg:98.3 F (36.8 C), Min:98.3 F (36.8 C), Max:98.3 F (36.8 C)  Recent Labs  Lab 03/09/19 1859  WBC 8.0  CREATININE 8.49*    Estimated Creatinine Clearance: 9.9 mL/min (A) (by C-G formula based on SCr of 8.49 mg/dL (H)).    Allergies  Allergen Reactions  . Azithromycin Nausea And Vomiting and Other (See Comments)    Chest tightness    Elicia Lamp, PharmD, BCPS Please check AMION for all [facility] contact numbers Clinical Pharmacist 03/09/2019 11:05 PM

## 2019-03-09 NOTE — ED Triage Notes (Signed)
Pt arrives POV for eval of rectal bleeding onset this AM. Pt reports he noted BRBPR this AM while using bathroom and reports that every 30 minutes he has experienced bloody diarrhea since then. Denies N/V, denies abd pain, denies hx of same. No anticoagulation

## 2019-03-10 ENCOUNTER — Other Ambulatory Visit: Payer: Self-pay

## 2019-03-10 LAB — BASIC METABOLIC PANEL
Anion gap: 10 (ref 5–15)
BUN: 32 mg/dL — ABNORMAL HIGH (ref 6–20)
CO2: 24 mmol/L (ref 22–32)
Calcium: 8.1 mg/dL — ABNORMAL LOW (ref 8.9–10.3)
Chloride: 107 mmol/L (ref 98–111)
Creatinine, Ser: 8.85 mg/dL — ABNORMAL HIGH (ref 0.61–1.24)
GFR calc Af Amer: 7 mL/min — ABNORMAL LOW (ref >60)
GFR calc non Af Amer: 6 mL/min — ABNORMAL LOW (ref >60)
Glucose, Bld: 129 mg/dL — ABNORMAL HIGH (ref 70–99)
Potassium: 5.7 mmol/L — ABNORMAL HIGH (ref 3.5–5.1)
Sodium: 141 mmol/L (ref 135–145)

## 2019-03-10 LAB — PREPARE RBC (CROSSMATCH)

## 2019-03-10 LAB — HEMATOCRIT: HCT: 20.7 % — ABNORMAL LOW (ref 39.0–52.0)

## 2019-03-10 LAB — HEMOGLOBIN: Hemoglobin: 6.5 g/dL — CL (ref 13.0–17.0)

## 2019-03-10 LAB — HIV ANTIBODY (ROUTINE TESTING W REFLEX): HIV Screen 4th Generation wRfx: NONREACTIVE

## 2019-03-10 LAB — SARS CORONAVIRUS 2 (TAT 6-24 HRS): SARS Coronavirus 2: NEGATIVE

## 2019-03-10 MED ORDER — SODIUM CHLORIDE 0.9% FLUSH
3.0000 mL | Freq: Two times a day (BID) | INTRAVENOUS | Status: DC
  Administered 2019-03-10 – 2019-03-12 (×4): 3 mL via INTRAVENOUS

## 2019-03-10 MED ORDER — SODIUM CHLORIDE 0.9% FLUSH: 3.0000 mL | INTRAVENOUS | Status: DC | PRN

## 2019-03-10 MED ORDER — DOXERCALCIFEROL 4 MCG/2ML IV SOLN
1.0000 ug | INTRAVENOUS | Status: DC
  Filled 2019-03-10: qty 2

## 2019-03-10 MED ORDER — SEVELAMER CARBONATE 800 MG PO TABS
2400.0000 mg | ORAL_TABLET | Freq: Three times a day (TID) | ORAL | Status: DC
  Administered 2019-03-10 – 2019-03-12 (×5): 2400 mg via ORAL
  Filled 2019-03-10 (×5): qty 3

## 2019-03-10 MED ORDER — FENTANYL CITRATE (PF) 100 MCG/2ML IJ SOLN: 12.5000 ug | INTRAMUSCULAR | Status: DC | PRN

## 2019-03-10 MED ORDER — SODIUM CHLORIDE 0.9% IV SOLUTION: Freq: Once | INTRAVENOUS | Status: DC

## 2019-03-10 MED ORDER — ACETAMINOPHEN 325 MG PO TABS: 650.0000 mg | ORAL_TABLET | Freq: Four times a day (QID) | ORAL | Status: DC | PRN

## 2019-03-10 MED ORDER — ONDANSETRON HCL 4 MG/2ML IJ SOLN
4.0000 mg | Freq: Four times a day (QID) | INTRAMUSCULAR | Status: DC | PRN
  Administered 2019-03-10: 4 mg via INTRAVENOUS
  Filled 2019-03-10: qty 2

## 2019-03-10 MED ORDER — SODIUM ZIRCONIUM CYCLOSILICATE 10 G PO PACK
10.0000 g | PACK | Freq: Once | ORAL | Status: AC
  Administered 2019-03-10: 10:00:00 10 g via ORAL
  Filled 2019-03-10: qty 1

## 2019-03-10 MED ORDER — CINACALCET HCL 30 MG PO TABS
30.0000 mg | ORAL_TABLET | Freq: Every day | ORAL | Status: DC
  Administered 2019-03-10 – 2019-03-12 (×3): 30 mg via ORAL
  Filled 2019-03-10 (×3): qty 1

## 2019-03-10 MED ORDER — SODIUM CHLORIDE 0.9% FLUSH
3.0000 mL | Freq: Two times a day (BID) | INTRAVENOUS | Status: DC
  Administered 2019-03-10 – 2019-03-11 (×3): 3 mL via INTRAVENOUS

## 2019-03-10 MED ORDER — HEPARIN SODIUM (PORCINE) 1000 UNIT/ML IJ SOLN
3.4000 mL | Freq: Once | INTRAMUSCULAR | Status: AC
  Administered 2019-03-11: 3400 [IU] via INTRAVENOUS

## 2019-03-10 MED ORDER — ACETAMINOPHEN 650 MG RE SUPP: 650.0000 mg | Freq: Four times a day (QID) | RECTAL | Status: DC | PRN

## 2019-03-10 MED ORDER — CHLORHEXIDINE GLUCONATE CLOTH 2 % EX PADS
6.0000 | MEDICATED_PAD | Freq: Every day | CUTANEOUS | Status: DC
  Administered 2019-03-10 – 2019-03-12 (×2): 6 via TOPICAL

## 2019-03-10 MED ORDER — SALINE SPRAY 0.65 % NA SOLN
1.0000 | NASAL | Status: DC | PRN
  Filled 2019-03-10: qty 44

## 2019-03-10 MED ORDER — CLONIDINE HCL 0.1 MG PO TABS
0.1000 mg | ORAL_TABLET | Freq: Two times a day (BID) | ORAL | Status: DC
  Administered 2019-03-10 – 2019-03-11 (×5): 0.1 mg via ORAL
  Filled 2019-03-10 (×6): qty 1

## 2019-03-10 MED ORDER — SODIUM CHLORIDE 0.9 % IV SOLN: 250.0000 mL | INTRAVENOUS | Status: DC | PRN

## 2019-03-10 MED ORDER — TACROLIMUS 1 MG PO CAPS
5.0000 mg | ORAL_CAPSULE | Freq: Two times a day (BID) | ORAL | Status: DC
  Administered 2019-03-10 – 2019-03-12 (×5): 5 mg via ORAL
  Filled 2019-03-10 (×6): qty 5

## 2019-03-10 MED ORDER — LABETALOL HCL 5 MG/ML IV SOLN: 10.0000 mg | INTRAVENOUS | Status: DC | PRN

## 2019-03-10 MED ORDER — ONDANSETRON HCL 4 MG PO TABS: 4.0000 mg | ORAL_TABLET | Freq: Four times a day (QID) | ORAL | Status: DC | PRN

## 2019-03-10 NOTE — ED Notes (Signed)
ED TO INPATIENT HANDOFF REPORT  ED Nurse Name and Phone #: Suhayla Chisom [phone]  S Name/Age/Gender Ryan [name] 58 y.o. male Room/Bed: 031C/031C  Code Status   Code Status: Prior  Home/SNF/Other Home Patient oriented to: self, place, time and situation Is this baseline? Yes   Triage Complete: Triage complete  Chief Complaint Acute lower GI bleeding [K92.2]  Triage Note Pt arrives POV for eval of rectal bleeding onset this AM. Pt reports he noted BRBPR this AM while using bathroom and reports that every 30 minutes he has experienced bloody diarrhea since then. Denies N/V, denies abd pain, denies hx of same. No anticoagulation    Allergies Allergies  Allergen Reactions  . Azithromycin Nausea And Vomiting and Other (See Comments)    Chest tightness    Level of Care/Admitting Diagnosis ED Disposition    ED Disposition Condition Comment   Admit  Hospital Area: [facility] [[id number]]  Level of Care: Telemetry Medical [104]  Covid Evaluation: Asymptomatic Screening Protocol (No Symptoms)  Diagnosis: Acute lower GI bleeding [id number]  Admitting Physician: Vianne Bulls [id number]  Attending Physician: Vianne Bulls [id number]  Estimated length of stay: past midnight tomorrow  Certification:: I certify this patient will need inpatient services for at least 2 midnights       B Medical/Surgery History Past Medical History:  Diagnosis Date  . Allergy   . Blood transfusion without reported diagnosis   . Dialysis patient ([place] [facility])    Tues,thurs,sat  . ESRD (end stage renal disease) ([place])   . Hyperlipidemia   . Hypertension    Past Surgical History:  Procedure Laterality Date  . A/V FISTULAGRAM Left 12/15/2016   Procedure: A/V Fistulagram - left;  Surgeon: Angelia Mould, MD;  Location: [facility];  Service: Cardiovascular;  Laterality: Left;  . AV FISTULA PLACEMENT Left 08/28/2016   Procedure: LEFT UPPER  ARM ARTERIOVENOUS (AV) FISTULA  CREATION;  Surgeon: Angelia Mould, MD;  Location: [facility];  Service: Vascular;  Laterality: Left;  . DIALYSIS/PERMA CATHETER INSERTION Right 12/21/2017   Procedure: INSERTION OF DIALYSIS CATHETER Right Internal Jugular .;  Surgeon: Elam Dutch, MD;  Location: [facility];  Service: Vascular;  Laterality: Right;  . FISTULA SUPERFICIALIZATION Left 09/23/2017   Procedure: FISTULA PLICATION LEFT ARM;  Surgeon: Elam Dutch, MD;  Location: [facility];  Service: Vascular;  Laterality: Left;  . FISTULOGRAM Left 12/21/2017   Procedure: FISTULOGRAM with Balloon Angioplasty.;  Surgeon: Elam Dutch, MD;  Location: [facility] OR;  Service: Vascular;  Laterality: Left;  . IR THORACENTESIS ASP PLEURAL SPACE W/IMG GUIDE  08/22/2017   1.2 L -right-sided  . IR THORACENTESIS ASP PLEURAL SPACE W/IMG GUIDE  10/19/2017  . REVISON OF ARTERIOVENOUS FISTULA Left [id number]   Procedure: PLICATION and Ligation of F LEFT ARM ARTERIOVENOUS FISTULA;  Surgeon: Elam Dutch, MD;  Location: [facility] OR;  Service: Vascular;  Laterality: Left;  . stent in kidneys     dec 2017  . TRANSTHORACIC ECHOCARDIOGRAM  09/22/2017    Severe LVH.  Normal function -EF 55-60%.  GRII DD.  Moderate RV dilation with mildly reduced RV function.   Fixed right coronary cusp with very mild aortic stenosis.  The myocardium has a speckled appearance --> .   Recommend cardiac MRI to evaluate for amyloid     A IV Location/Drains/Wounds Patient Lines/Drains/Airways Status   Active Line/Drains/Airways    Name:   Placement date:   Placement time:   Site:  Days:   Peripheral IV 03/09/19 Right Antecubital   03/09/19    1912    Antecubital   1   Peripheral IV 03/09/19 Left Forearm   03/09/19    1913    Forearm   1   Fistula / Graft Left Upper arm Arteriovenous fistula   08/28/16    1030    Upper arm   924   Fistula / Graft Left Upper arm Arteriovenous fistula   --    --    Upper arm      Hemodialysis Catheter Right Internal jugular Permanent   12/21/17     1143    Internal jugular   444   Incision (Closed) 09/23/17 Arm Left   09/23/17    1751     533   Incision (Closed) 12/21/17 Arm Left   12/21/17    1047     444   Incision (Closed) 12/21/17 Neck Right   12/21/17    1135     444   Incision (Closed) 12/21/17 Chest Right   12/21/17    1135     444          Intake/Output Last 24 hours No intake or output data in the 24 hours ending 03/10/19 0152  Labs/Imaging Results for orders placed or performed during the hospital encounter of 03/09/19 (from the past 48 hour(s))  Comprehensive metabolic panel     Status: Abnormal   Collection Time: 03/09/19  6:59 PM  Result Value Ref Range   Sodium 142 135 - 145 mmol/L   Potassium 4.6 3.5 - 5.1 mmol/L   Chloride 106 98 - 111 mmol/L   CO2 25 22 - 32 mmol/L   Glucose, Bld 128 (H) 70 - 99 mg/dL   BUN 28 (H) 6 - 20 mg/dL   Creatinine, Ser 8.49 (H) 0.61 - 1.24 mg/dL   Calcium 8.8 (L) 8.9 - 10.3 mg/dL   Total Protein 6.8 6.5 - 8.1 g/dL   Albumin 3.4 (L) 3.5 - 5.0 g/dL   AST 15 15 - 41 U/L   ALT 14 0 - 44 U/L   Alkaline Phosphatase 78 38 - 126 U/L   Total Bilirubin 0.4 0.3 - 1.2 mg/dL   GFR calc non Af Amer 6 (L) >60 mL/min   GFR calc Af Amer 7 (L) >60 mL/min   Anion gap 11 5 - 15    Comment: Performed at [facility], [address] [place], [place] [address]  CBC     Status: Abnormal   Collection Time: 03/09/19  6:59 PM  Result Value Ref Range   WBC 8.0 4.0 - 10.5 K/uL   RBC 3.32 (L) 4.22 - 5.81 MIL/uL   Hemoglobin 9.3 (L) 13.0 - 17.0 g/dL   HCT 31.0 (L) 39.0 - 52.0 %   MCV 93.4 80.0 - 100.0 fL   MCH 28.0 26.0 - 34.0 pg   MCHC 30.0 30.0 - 36.0 g/dL   RDW 14.1 11.5 - 15.5 %   Platelets 242 150 - 400 K/uL   nRBC 0.0 0.0 - 0.2 %    Comment: Performed at [facility], [place] [address], [place], [place] [address]  Type and screen [place]     Status: None   Collection Time: 03/09/19  6:59 PM  Result Value Ref Range   ABO/RH(D) O POS    Antibody Screen POS     Sample Expiration 03/12/2019,2359    Antibody Identification  NO CLINICALLY SIGNIFICANT ANTIBODY IDENTIFIED Performed at [facility], [place] [address] [place], [place] [address]   POC occult blood, ED     Status: Abnormal   Collection Time: 03/09/19  7:12 PM  Result Value Ref Range   Fecal Occult Bld POSITIVE (A) NEGATIVE   CT ABDOMEN PELVIS W CONTRAST  Result Date: 03/09/2019 CLINICAL DATA:  Generalized abdominal pain EXAM: CT ABDOMEN AND PELVIS WITH CONTRAST TECHNIQUE: Multidetector CT imaging of the abdomen and pelvis was performed using the standard protocol following bolus administration of intravenous contrast. CONTRAST:  172mL OMNIPAQUE IOHEXOL 300 MG/ML  SOLN COMPARISON:  CT 07/07/2017 FINDINGS: Lower chest: No acute abnormality. Hepatobiliary: No focal liver abnormality is seen. No gallstones, gallbladder wall thickening, or biliary dilatation. Pancreas: Unremarkable. No pancreatic ductal dilatation or surrounding inflammatory changes. Spleen: Normal in size without focal abnormality. Adrenals/Urinary Tract: Bilateral adrenal glands are unremarkable. Chronic atrophy of the bilateral native kidneys without hydronephrosis. Simple cyst at the superior pole of the native right kidney. Ureters are unremarkable. The transplant kidney within the right lower quadrant has decreased in size compared to prior study. Heterogeneous appearance of the transplant kidney with cortical calcifications. There is a nephroureteral stent in place. Urinary bladder is circumferentially thickened and irregular in appearance with adjacent fat stranding. Stomach/Bowel: Stomach is within normal limits. Appendix not clearly visualized and may be surgically absent. Scattered colonic diverticulosis. Mildly thickened, hyperemic appearance of the rectosigmoid colon, which may reflect a nonspecific colitis. Additionally, there is a thickened segment of transverse colon near the hepatic flexure with subtle pericolonic  fat stranding and multiple mildly prominent diverticula (series 5, images 80-83). No dilated loops of bowel. Vascular/Lymphatic: Aortic atherosclerosis. No enlarged abdominal or pelvic lymph nodes. Reproductive: Prostate is unremarkable. Other: No abdominal wall hernia or abnormality. No abdominopelvic ascites. Musculoskeletal: Mild diffusely sclerotic appearance of the osseous structures suggesting renal osteodystrophy. Lucent changes within the L2 vertebral body are less prominent compared to prior. Scattered Schmorl's nodes. IMPRESSION: 1. Thickened segment of transverse colon near the hepatic flexure with subtle pericolonic fat stranding and multiple mildly prominent diverticula, suggestive of early acute diverticulitis. 2. Circumferentially thickened, hyperemic appearance of the rectosigmoid colon suggesting a nonspecific colitis. 3. 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 4. Thickened appearance of the urinary bladder, which could reflect cystitis. 5. Aortic atherosclerosis. 6. Mild diffusely sclerotic appearance of the osseous structures suggesting renal osteodystrophy. Aortic Atherosclerosis (ICD10-I70.0). Electronically Signed   By: Davina Poke M.D.   On: 03/09/2019 20:29    Pending Labs Unresulted Labs (From admission, onward)    Start     Ordered   03/10/19 0115  Hemoglobin  Now then every 8 hours,   R (with STAT occurrences)     03/10/19 0114   03/10/19 0115  Hematocrit  Now then every 8 hours,   R (with STAT occurrences)     03/10/19 0114   03/09/19 2210  SARS CORONAVIRUS 2 (TAT 6-24 HRS) Nasopharyngeal Nasopharyngeal Swab  (Tier 3 (TAT 6-24 hrs))  Once,   STAT    Question Answer Comment  Is this test for diagnosis or screening Screening   Symptomatic for COVID-19 as defined by CDC No   Hospitalized for COVID-19 No   Admitted to ICU for COVID-19 No   Previously tested for COVID-19 No   Resident in a congregate (group) care  setting No   Employed in healthcare setting No      03/09/19 2209   Signed  and Held  HIV Antibody (routine testing w rflx)  (HIV Antibody (Routine testing w reflex) panel)  Tomorrow morning,   R     Signed and Held   Signed and Held  Basic metabolic panel  Tomorrow morning,   R     Signed and Held          Vitals/Pain Today's Vitals   03/09/19 2139 03/09/19 2230 03/09/19 2345 03/10/19 0000  BP: (!) 177/116 (!) 194/121 (!) 188/127 (!) 176/105  Pulse: 99 (!) 101 (!) 102 (!) 104  Resp: 18  11 18   Temp:      TempSrc:      SpO2: 99% 96% 99% 98%  Weight:      Height:      PainSc:        Isolation Precautions No active isolations  Medications Medications  piperacillin-tazobactam (ZOSYN) IVPB 2.25 g (has no administration in time range)  tacrolimus (PROGRAF) capsule 5 mg (has no administration in time range)  cloNIDine (CATAPRES) tablet 0.1 mg (has no administration in time range)  acetaminophen (TYLENOL) tablet 650 mg (has no administration in time range)    Or  acetaminophen (TYLENOL) suppository 650 mg (has no administration in time range)  ondansetron (ZOFRAN) tablet 4 mg (has no administration in time range)    Or  ondansetron (ZOFRAN) injection 4 mg (has no administration in time range)  fentaNYL (SUBLIMAZE) injection 12.5-25 mcg (has no administration in time range)  labetalol (NORMODYNE) injection 10 mg (has no administration in time range)  pantoprazole (PROTONIX) injection 40 mg (40 mg Intravenous Given 03/09/19 2014)  0.9 %  sodium chloride infusion ( Intravenous Stopped 03/09/19 2234)  morphine 2 MG/ML injection 2 mg (2 mg Intravenous Given 03/09/19 2014)  ondansetron (ZOFRAN) injection 4 mg (4 mg Intravenous Given 03/09/19 2014)  iohexol (OMNIPAQUE) 300 MG/ML solution 100 mL (100 mLs Intravenous Contrast Given 03/09/19 1948)  cefTRIAXone (ROCEPHIN) 2 g in sodium chloride 0.9 % 100 mL IVPB (0 g Intravenous Stopped 03/09/19 2256)    And  metroNIDAZOLE (FLAGYL)  IVPB 500 mg (0 mg Intravenous Stopped 03/10/19 0009)    Mobility walks Low fall risk   Focused Assessments    R Recommendations: See Admitting Provider Note  Report given to:   Additional Notes:

## 2019-03-10 NOTE — Consult Note (Signed)
[name] Renal Consultation Note    Indication for Consultation:  Management of ESRD/hemodialysis; anemia, hypertension/volume and secondary hyperparathyroidism  [id number], Ryan Hail, MD  HPI: Ryan [name] is a 59 y.o. male. 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   Patient presented to the ED yesterday following an episode of hematochezia that was followed by weakness.  Reports passing large amount of blood in the toliet x2, and waking up today in the hospital bed in a pool of blood.  Denies previous episodes of BRBPR or melena.  No abdominal pain or pain with defecation.  Denies CP, SOB and n/v/d.  Admits to feeling weak and a little unsteady on his feet.  Says he has recently changed to a vegan diet and has been losing weight.  Reports he has been getting under his dry weight at dialysis.    Pertinent findings since admission include negative COVID, K 5.7, Hgb drop from 9.3 to 6.5, +FOBT and CT ABD with suspected acute diverticulitis and possible colitis.    Of note patient has intermittent compliance with prescribed dialysis regimen.  He has been getting under his dry weight for the last week except last treatment where he left 33min early and 0.5kg over.  Baseline hemoglobin has been stable at 10.5-12 with last tsat at 40%, and is on monthly mircera.  History of hyperkalemia on lokelma on non dialysis days.    Past Medical History:  Diagnosis Date  . Allergy   . Blood transfusion without reported diagnosis   . Dialysis patient ([place])    Tues,thurs,sat  . ESRD (end stage renal disease) ([place])   . Hyperlipidemia   . Hypertension    Past Surgical History:  Procedure Laterality Date  . A/V FISTULAGRAM Left 12/15/2016   Procedure: A/V Fistulagram - left;  Surgeon: Angelia Mould, MD;  Location: [place] [facility];  Service: Cardiovascular;  Laterality:  Left;  . AV FISTULA PLACEMENT Left 08/28/2016   Procedure: LEFT UPPER  ARM ARTERIOVENOUS (AV) FISTULA CREATION;  Surgeon: Angelia Mould, MD;  Location: [place];  Service: Vascular;  Laterality: Left;  . DIALYSIS/PERMA CATHETER INSERTION Right 12/21/2017   Procedure: INSERTION OF DIALYSIS CATHETER Right Internal Jugular .;  Surgeon: Elam Dutch, MD;  Location: [place];  Service: Vascular;  Laterality: Right;  . FISTULA SUPERFICIALIZATION Left 09/23/2017   Procedure: FISTULA PLICATION LEFT ARM;  Surgeon: Elam Dutch, MD;  Location: [place];  Service: Vascular;  Laterality: Left;  . FISTULOGRAM Left 12/21/2017   Procedure: FISTULOGRAM with Balloon Angioplasty.;  Surgeon: Elam Dutch, MD;  Location: [facility];  Service: Vascular;  Laterality: Left;  . IR THORACENTESIS ASP PLEURAL SPACE W/IMG GUIDE  08/22/2017   1.2 L -right-sided  . IR THORACENTESIS ASP PLEURAL SPACE W/IMG GUIDE  10/19/2017  . REVISON OF ARTERIOVENOUS FISTULA Left [id number]   Procedure: PLICATION and Ligation of F LEFT ARM ARTERIOVENOUS FISTULA;  Surgeon: Elam Dutch, MD;  Location: [facility] OR;  Service: Vascular;  Laterality: Left;  . stent in kidneys     dec 2017  . TRANSTHORACIC ECHOCARDIOGRAM  09/22/2017    Severe LVH.  Normal function -EF 55-60%.  GRII DD.  Moderate RV dilation with mildly reduced RV function.   Fixed right coronary cusp with very mild aortic stenosis.  The myocardium has a speckled appearance --> .   Recommend cardiac MRI to  evaluate for amyloid   Family History  Problem Relation Age of Onset  . Hypertension Mother   . Heart disease Mother 72       By his report, he thinks that she had heart attack.  . Stroke Mother   . Cancer Father   . Kidney cancer Father   . Hypertension Sister   . Heart disease Maternal Grandmother   . Alcohol abuse Maternal Grandfather   . Mental illness Paternal Grandmother   . Learning disabilities Paternal Grandmother        Alzheimer's   . Stroke Paternal  Grandfather   . Colon cancer Neg Hx   . Colon polyps Neg Hx   . Esophageal cancer Neg Hx   . Rectal cancer Neg Hx   . Stomach cancer Neg Hx    Social History:  reports that he has never smoked. He has never used smokeless tobacco. He reports that he does not drink alcohol or use drugs. Allergies  Allergen Reactions  . Azithromycin Nausea And Vomiting and Other (See Comments)    Chest tightness   Prior to Admission medications   Medication Sig Start Date End Date Taking? Authorizing Provider  cinacalcet (SENSIPAR) 30 MG tablet Take 30 mg by mouth daily. 12/08/17  Yes [provider]  cloNIDine (CATAPRES) 0.1 MG tablet Take 1 tablet (0.1 mg total) by mouth 2 (two) times daily. 09/24/17  Yes Sheikh, Omair Latif, DO  furosemide (LASIX) 80 MG tablet TAKE 1 TABLET (80 MG TOTAL) BY MOUTH 2 (TWO) TIMES DAILY. 03/29/18  Yes Orma Flaming, MD  labetalol (NORMODYNE) 300 MG tablet TAKE 1 TABLET (300 MG TOTAL) BY MOUTH 2 (TWO) TIMES DAILY. OFFICE VISIT NEEDED Patient taking differently: Take 300 mg by mouth 2 (two) times daily.  11/21/18  Yes Leonie Man, MD  LOKELMA 10 g PACK packet Take 1 packet by mouth 3 (three) times a week. Monday, Wednesday, and Friday 02/01/19  Yes [provider]  loratadine (CLARITIN) 10 MG tablet Take 10 mg by mouth daily as needed for allergies.   Yes [provider]  losartan (COZAAR) 50 MG tablet Take 1 tablet (50 mg total) by mouth daily. 04/12/18  Yes Leonie Man, MD  multivitamin (RENA-VIT) TABS tablet Take 1 tablet by mouth daily.   Yes [provider]  NIFEdipine (PROCARDIA XL/ADALAT-CC) 60 MG 24 hr tablet Take 60 mg by mouth 2 (two) times daily.   Yes [provider]  omeprazole (PRILOSEC) 20 MG capsule TAKE 1 CAPSULE BY MOUTH EVERY DAY Patient taking differently: Take 20 mg by mouth daily.  08/10/18  Yes Mannam, Praveen, MD  pravastatin (PRAVACHOL) 80 MG tablet Take 1 tablet (80 mg total) by mouth at bedtime. 12/02/17   Yes Orma Flaming, MD  sevelamer carbonate (RENVELA) 800 MG tablet Take 1 tablet (800 mg total) by mouth 3 (three) times daily with meals. 09/24/17  Yes Sheikh, Omair Latif, DO  tacrolimus (PROGRAF) 5 MG capsule Take 5 mg by mouth 2 (two) times daily.   Yes [provider]   Current Facility-Administered Medications  Medication Dose Route Frequency Provider Last Rate Last Admin  . 0.9 %  sodium chloride infusion (Manually program via Guardrails IV Fluids)   Intravenous Once Kirby-Graham, Karsten Fells, NP      . 0.9 %  sodium chloride infusion (Manually program via Guardrails IV Fluids)   Intravenous Once Vann, Jessica U, DO      . 0.9 %  sodium chloride infusion  250  mL Intravenous PRN Opyd, Ilene Qua, MD      . acetaminophen (TYLENOL) tablet 650 mg  650 mg Oral Q6H PRN Opyd, Ilene Qua, MD       Or  . acetaminophen (TYLENOL) suppository 650 mg  650 mg Rectal Q6H PRN Opyd, Ilene Qua, MD      . Chlorhexidine Gluconate Cloth 2 % PADS 6 each  6 each Topical Q0600 Kortlyn Koltz, PA      . cloNIDine (CATAPRES) tablet 0.1 mg  0.1 mg Oral BID Opyd, Ilene Qua, MD   0.1 mg at 03/10/19 0830  . fentaNYL (SUBLIMAZE) injection 12.5-25 mcg  12.5-25 mcg Intravenous Q2H PRN Opyd, Ilene Qua, MD      . labetalol (NORMODYNE) injection 10 mg  10 mg Intravenous Q2H PRN Opyd, Ilene Qua, MD      . ondansetron (ZOFRAN) tablet 4 mg  4 mg Oral Q6H PRN Opyd, Ilene Qua, MD       Or  . ondansetron (ZOFRAN) injection 4 mg  4 mg Intravenous Q6H PRN Opyd, Ilene Qua, MD   4 mg at 03/10/19 0659  . piperacillin-tazobactam (ZOSYN) IVPB 2.25 g  2.25 g Intravenous Q8H Opyd, Ilene Qua, MD 100 mL/hr at 03/10/19 0648 2.25 g at 03/10/19 0648  . sodium chloride flush (NS) 0.9 % injection 3 mL  3 mL Intravenous Q12H Opyd, Ilene Qua, MD   3 mL at 03/10/19 0625  . sodium chloride flush (NS) 0.9 % injection 3 mL  3 mL Intravenous Q12H Opyd, Ilene Qua, MD   3 mL at 03/10/19 0624  . sodium chloride flush (NS) 0.9 % injection 3 mL  3  mL Intravenous PRN Opyd, Ilene Qua, MD      . sodium zirconium cyclosilicate (LOKELMA) packet 10 g  10 g Oral Once Zayquan Bogard, Ria Comment, PA      . tacrolimus (PROGRAF) capsule 5 mg  5 mg Oral BID Vianne Bulls, MD       Labs: Basic Metabolic Panel: Recent Labs  Lab 03/09/19 1859 03/10/19 0335  NA 142 141  K 4.6 5.7*  CL 106 107  CO2 25 24  GLUCOSE 128* 129*  BUN 28* 32*  CREATININE 8.49* 8.85*  CALCIUM 8.8* 8.1*   Liver Function Tests: Recent Labs  Lab 03/09/19 1859  AST 15  ALT 14  ALKPHOS 78  BILITOT 0.4  PROT 6.8  ALBUMIN 3.4*   CBC: Recent Labs  Lab 03/09/19 1859 03/10/19 0335  WBC 8.0  --   HGB 9.3* 6.5*  HCT 31.0* 20.7*  MCV 93.4  --   PLT 242  --    Studies/Results: CT ABDOMEN PELVIS W CONTRAST  Result Date: 03/09/2019 CLINICAL DATA:  Generalized abdominal pain EXAM: CT ABDOMEN AND PELVIS WITH CONTRAST TECHNIQUE: Multidetector CT imaging of the abdomen and pelvis was performed using the standard protocol following bolus administration of intravenous contrast. CONTRAST:  11mL OMNIPAQUE IOHEXOL 300 MG/ML  SOLN COMPARISON:  CT 07/07/2017 FINDINGS: Lower chest: No acute abnormality. Hepatobiliary: No focal liver abnormality is seen. No gallstones, gallbladder wall thickening, or biliary dilatation. Pancreas: Unremarkable. No pancreatic ductal dilatation or surrounding inflammatory changes. Spleen: Normal in size without focal abnormality. Adrenals/Urinary Tract: Bilateral adrenal glands are unremarkable. Chronic atrophy of the bilateral native kidneys without hydronephrosis. Simple cyst at the superior pole of the native right kidney. Ureters are unremarkable. The transplant kidney within the right lower quadrant has decreased in size compared to prior study. Heterogeneous appearance of the transplant kidney with cortical  calcifications. There is a nephroureteral stent in place. Urinary bladder is circumferentially thickened and irregular in appearance with adjacent  fat stranding. Stomach/Bowel: Stomach is within normal limits. Appendix not clearly visualized and may be surgically absent. Scattered colonic diverticulosis. Mildly thickened, hyperemic appearance of the rectosigmoid colon, which may reflect a nonspecific colitis. Additionally, there is a thickened segment of transverse colon near the hepatic flexure with subtle pericolonic fat stranding and multiple mildly prominent diverticula (series 5, images 80-83). No dilated loops of bowel. Vascular/Lymphatic: Aortic atherosclerosis. No enlarged abdominal or pelvic lymph nodes. Reproductive: Prostate is unremarkable. Other: No abdominal wall hernia or abnormality. No abdominopelvic ascites. Musculoskeletal: Mild diffusely sclerotic appearance of the osseous structures suggesting renal osteodystrophy. Lucent changes within the L2 vertebral body are less prominent compared to prior. Scattered Schmorl's nodes. IMPRESSION: 1. Thickened segment of transverse colon near the hepatic flexure with subtle pericolonic fat stranding and multiple mildly prominent diverticula, suggestive of early acute diverticulitis. 2. Circumferentially thickened, hyperemic appearance of the rectosigmoid colon suggesting a nonspecific colitis. 3. 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 4. Thickened appearance of the urinary bladder, which could reflect cystitis. 5. Aortic atherosclerosis. 6. Mild diffusely sclerotic appearance of the osseous structures suggesting renal osteodystrophy. Aortic Atherosclerosis (ICD10-I70.0). Electronically Signed   By: Davina Poke M.D.   On: 03/09/2019 20:29    ROS: All others negative except those listed in HPI.  Physical Exam: Vitals:   03/10/19 0255 03/10/19 0601 03/10/19 0900 03/10/19 1000  BP: (!) 149/104 126/75 136/90 132/84  Pulse: 99 96 99 89  Resp:  16 16 16   Temp: (!) 96.8 F (36 C) 97.7 F (36.5 C) 98.4 F (36.9 C) 98.9 F (37.2 C)   TempSrc: Bladder Oral Oral Oral  SpO2: 99% 100% 100% 100%  Weight: 79.6 kg     Height:         General: WDWN, NAD, well appearing male Head: NCAT sclera not icteric Neck: Supple. No lymphadenopathy Lungs: CTA bilaterally. No wheeze, rales or rhonchi. Breathing is unlabored. Heart: RRR. No murmur, rubs or gallops.  Abdomen: soft, nontender, +BS, no guarding, no rebound tenderness Lower extremities:no edema, ischemic changes, or open wounds  Neuro: AAOx3. Moves all extremities spontaneously. Psych:  Responds to questions appropriately with a normal affect. Dialysis [facility]: [facility]  Dialysis Orders:  TTS - [facility]  4hrs, BFR 450, DFR 800,  EDW 82.5kg, 2K/ 3.5Ca  Access: TDC  Heparin None Mircera 50 mcg q4wks - last 11/19 Hectorol 75mcg IV qHD    Last Labs: 12/10 Hgb 10.6, TSAT 40, K 5.2, Ca 8.2, P 3.4, PTH 295, Alb 4.1  Assessment/Plan: 1.  Painless hematochezia - several episodes starting yesterday, last early this AM. CT abd showed likely early acute diverticulitis.  On Zosyn.  Per primary 2. Hyperkalemia - K 5.7.  On lokelma as OP. Give 10g today.  3.  ESRD -  HD TTS.  Plan for HD today per regular schedule to allow for any possible procedures/workup that may need to be completed tomorrow.  Will be shortened to 3 hrs due to high census.   4.  Hypertension/volume  - Blood pressure well controlled on clonidine and labetalol.  Does not appear grossly volume overloaded.  Plan for net UF 1-1.5L.  Likely need to lower dry on d/c due to weight loss.  5.  ABLA on Chronic anemia of CKD - Hgb drop from 9.3>6.5. Plan for 2units pRBC. 6.  Secondary Hyperparathyroidism -  Ca  in goal. Phos in goal as OP.  Continue VDRA, sensipar and binders.   7.  Nutrition - Renal diet w/fluid restrictions once advanced.  8. Failed LDR renal transplant - on prograf  Jen Mow, PA-C [facility] Pager: [phone] 03/10/2019, 11:07 AM

## 2019-03-10 NOTE — H&P (Signed)
History and Physical    Ryan [name] [id number] DOB: 1960/06/29 DOA: 03/09/2019  PCP: Orma Flaming, MD   Patient coming from: Home   Chief Complaint: Painless hematochezia   HPI: Ryan [name] is a 58 y.o. male with medical history significant for history of renal transplant on Prograf, ESRD on hemodialysis, hypertension, and hyperlipidemia, now presenting to the emergency department for evaluation of bright red blood per rectum.  The patient reports that he had been in his usual state of health until 6 AM this morning when he suddenly felt a need to move his bowels, but passed mainly just bright red blood into the commode.  He went on to have additional episodes, some mixed with clots and stool.  He has not had any pain at all associated with this and denies any recent fevers or chills.  He has never experienced similar symptoms previously.  He denies cough, shortness of breath, chest pain, fevers, or chills.  ED Course: Upon arrival to the ED, patient is found to be afebrile, saturating mid 90s on room air, tachycardic to 110, and hypertensive to 195/116.  Chemistry panel is notable for BUN of 28 and creatinine 8.49.  CBC features a normocytic anemia with hemoglobin of 9.3, down from 10.3 one year ago.  Fecal occult blood testing is positive.  CT of the abdomen and pelvis is notable for thickened segment of transverse colon near the hepatic flexure with subtle pericolonic fat stranding and mildly prominent diverticula suggestive of an early acute diverticulitis.  Type and screen was performed in the ED, 1 dose of IV Protonix was given, and the patient was treated with Rocephin and Flagyl.  Review of Systems:  All other systems reviewed and apart from HPI, are negative.  Past Medical History:  Diagnosis Date  . Allergy   . Blood transfusion without reported diagnosis   . Dialysis patient ([place])    Tues,thurs,sat  . ESRD (end stage renal disease) ([place])   . Hyperlipidemia     . Hypertension     Past Surgical History:  Procedure Laterality Date  . A/V FISTULAGRAM Left 12/15/2016   Procedure: A/V Fistulagram - left;  Surgeon: Angelia Mould, MD;  Location: [place] [facility];  Service: Cardiovascular;  Laterality: Left;  . AV FISTULA PLACEMENT Left 08/28/2016   Procedure: LEFT UPPER  ARM ARTERIOVENOUS (AV) FISTULA CREATION;  Surgeon: Angelia Mould, MD;  Location: [place];  Service: Vascular;  Laterality: Left;  . DIALYSIS/PERMA CATHETER INSERTION Right 12/21/2017   Procedure: INSERTION OF DIALYSIS CATHETER Right Internal Jugular .;  Surgeon: Elam Dutch, MD;  Location: [place];  Service: Vascular;  Laterality: Right;  . FISTULA SUPERFICIALIZATION Left 09/23/2017   Procedure: FISTULA PLICATION LEFT ARM;  Surgeon: Elam Dutch, MD;  Location: [place];  Service: Vascular;  Laterality: Left;  . FISTULOGRAM Left 12/21/2017   Procedure: FISTULOGRAM with Balloon Angioplasty.;  Surgeon: Elam Dutch, MD;  Location: [facility] OR;  Service: Vascular;  Laterality: Left;  . IR THORACENTESIS ASP PLEURAL SPACE W/IMG GUIDE  08/22/2017   1.2 L -right-sided  . IR THORACENTESIS ASP PLEURAL SPACE W/IMG GUIDE  10/19/2017  . REVISON OF ARTERIOVENOUS FISTULA Left [id number]   Procedure: PLICATION and Ligation of F LEFT ARM ARTERIOVENOUS FISTULA;  Surgeon: Elam Dutch, MD;  Location: [facility];  Service: Vascular;  Laterality: Left;  . stent in kidneys     dec 2017  . TRANSTHORACIC ECHOCARDIOGRAM  09/22/2017    Severe LVH.  Normal function -EF 55-60%.  GRII DD.  Moderate RV dilation with mildly reduced RV function.   Fixed right coronary cusp with very mild aortic stenosis.  The myocardium has a speckled appearance --> .   Recommend cardiac MRI to evaluate for amyloid     reports that he has never smoked. He has never used smokeless tobacco. He reports that he does not drink alcohol or use drugs.  Allergies  Allergen Reactions  . Azithromycin Nausea And Vomiting and  Other (See Comments)    Chest tightness    Family History  Problem Relation Age of Onset  . Hypertension Mother   . Heart disease Mother 65       By his report, he thinks that she had heart attack.  . Stroke Mother   . Cancer Father   . Kidney cancer Father   . Hypertension Sister   . Heart disease Maternal Grandmother   . Alcohol abuse Maternal Grandfather   . Mental illness Paternal Grandmother   . Learning disabilities Paternal Grandmother        Alzheimer's   . Stroke Paternal Grandfather   . Colon cancer Neg Hx   . Colon polyps Neg Hx   . Esophageal cancer Neg Hx   . Rectal cancer Neg Hx   . Stomach cancer Neg Hx      Prior to Admission medications   Medication Sig Start Date End Date Taking? Authorizing Provider  cinacalcet (SENSIPAR) 30 MG tablet Take 30 mg by mouth daily. 12/08/17  Yes [provider]  cloNIDine (CATAPRES) 0.1 MG tablet Take 1 tablet (0.1 mg total) by mouth 2 (two) times daily. 09/24/17  Yes Sheikh, Omair Latif, DO  furosemide (LASIX) 80 MG tablet TAKE 1 TABLET (80 MG TOTAL) BY MOUTH 2 (TWO) TIMES DAILY. 03/29/18  Yes Orma Flaming, MD  labetalol (NORMODYNE) 300 MG tablet TAKE 1 TABLET (300 MG TOTAL) BY MOUTH 2 (TWO) TIMES DAILY. OFFICE VISIT NEEDED Patient taking differently: Take 300 mg by mouth 2 (two) times daily.  11/21/18  Yes Leonie Man, MD  LOKELMA 10 g PACK packet Take 1 packet by mouth 3 (three) times a week. Monday, Wednesday, and Friday 02/01/19  Yes [provider]  loratadine (CLARITIN) 10 MG tablet Take 10 mg by mouth daily as needed for allergies.   Yes [provider]  losartan (COZAAR) 50 MG tablet Take 1 tablet (50 mg total) by mouth daily. 04/12/18  Yes Leonie Man, MD  multivitamin (RENA-VIT) TABS tablet Take 1 tablet by mouth daily.   Yes [provider]  NIFEdipine (PROCARDIA XL/ADALAT-CC) 60 MG 24 hr tablet Take 60 mg by mouth 2 (two) times daily.   Yes [provider]  omeprazole  (PRILOSEC) 20 MG capsule TAKE 1 CAPSULE BY MOUTH EVERY DAY Patient taking differently: Take 20 mg by mouth daily.  08/10/18  Yes Mannam, Praveen, MD  pravastatin (PRAVACHOL) 80 MG tablet Take 1 tablet (80 mg total) by mouth at bedtime. 12/02/17  Yes Orma Flaming, MD  sevelamer carbonate (RENVELA) 800 MG tablet Take 1 tablet (800 mg total) by mouth 3 (three) times daily with meals. 09/24/17  Yes Sheikh, Omair Latif, DO  tacrolimus (PROGRAF) 5 MG capsule Take 5 mg by mouth 2 (two) times daily.   Yes [provider]    Physical Exam: Vitals:   03/09/19 2139 03/09/19 2230 03/09/19 2345 03/10/19 0000  BP: (!) 177/116 (!) 194/121 (!) 188/127 (!) 176/105  Pulse: 99 (!) 101 Marland Kitchen)  102 (!) 104  Resp: 18  11 18   Temp:      TempSrc:      SpO2: 99% 96% 99% 98%  Weight:      Height:        Constitutional: NAD, calm, comfortable Eyes: PERTLA, lids and conjunctivae normal ENMT: Mucous membranes are moist. Posterior pharynx clear of any exudate or lesions.   Neck: normal, supple, no masses, no thyromegaly Respiratory: clear to auscultation bilaterally, no wheezing, no crackles. Normal respiratory effort. No accessory muscle use.  Cardiovascular: S1 & S2 heard, regular rate and rhythm, no significant murmurs / rubs / gallops. No extremity edema. 2+ pedal pulses. No carotid bruits. No significant JVD. Abdomen: No distension, no tenderness, no masses palpated. Bowel sounds normal.  Musculoskeletal: no clubbing / cyanosis. No joint deformity upper and lower extremities. Normal muscle tone.  Skin: no significant rashes, lesions, ulcers. Warm, dry, well-perfused. Neurologic: CN 2-12 grossly intact. Sensation intact, DTR normal. Strength 5/5 in all 4 limbs.  Psychiatric: Normal judgment and insight. Alert and oriented x 3. Normal mood and affect.    Labs on Admission: I have personally reviewed following labs and imaging studies  CBC: Recent Labs  Lab 03/09/19 1859  WBC 8.0  HGB 9.3*  HCT 31.0*   MCV 93.4  PLT [id number]   Basic Metabolic Panel: Recent Labs  Lab 03/09/19 1859  NA 142  K 4.6  CL 106  CO2 25  GLUCOSE 128*  BUN 28*  CREATININE 8.49*  CALCIUM 8.8*   GFR: Estimated Creatinine Clearance: 9.9 mL/min (A) (by C-G formula based on SCr of 8.49 mg/dL (H)). Liver Function Tests: Recent Labs  Lab 03/09/19 1859  AST 15  ALT 14  ALKPHOS 78  BILITOT 0.4  PROT 6.8  ALBUMIN 3.4*   No results for input(s): LIPASE, AMYLASE in the last 168 hours. No results for input(s): AMMONIA in the last 168 hours. Coagulation Profile: No results for input(s): INR, PROTIME in the last 168 hours. Cardiac Enzymes: No results for input(s): CKTOTAL, CKMB, CKMBINDEX, TROPONINI in the last 168 hours. BNP (last 3 results) No results for input(s): PROBNP in the last 8760 hours. HbA1C: No results for input(s): HGBA1C in the last 72 hours. CBG: No results for input(s): GLUCAP in the last 168 hours. Lipid Profile: No results for input(s): CHOL, HDL, LDLCALC, TRIG, CHOLHDL, LDLDIRECT in the last 72 hours. Thyroid Function Tests: No results for input(s): TSH, T4TOTAL, FREET4, T3FREE, THYROIDAB in the last 72 hours. Anemia Panel: No results for input(s): VITAMINB12, FOLATE, FERRITIN, TIBC, IRON, RETICCTPCT in the last 72 hours. Urine analysis:    Component Value Date/Time   COLORURINE RED (A) 07/12/2017 0940   APPEARANCEUR CLOUDY (A) 07/12/2017 0940   LABSPEC  07/12/2017 0940    TEST NOT REPORTED DUE TO COLOR INTERFERENCE OF URINE PIGMENT   PHURINE  07/12/2017 0940    TEST NOT REPORTED DUE TO COLOR INTERFERENCE OF URINE PIGMENT   GLUCOSEU (A) 07/12/2017 0940    TEST NOT REPORTED DUE TO COLOR INTERFERENCE OF URINE PIGMENT   HGBUR (A) 07/12/2017 0940    TEST NOT REPORTED DUE TO COLOR INTERFERENCE OF URINE PIGMENT   BILIRUBINUR (A) 07/12/2017 0940    TEST NOT REPORTED DUE TO COLOR INTERFERENCE OF URINE PIGMENT   KETONESUR (A) 07/12/2017 0940    TEST NOT REPORTED DUE TO COLOR  INTERFERENCE OF URINE PIGMENT   PROTEINUR (A) 07/12/2017 0940    TEST NOT REPORTED DUE TO COLOR INTERFERENCE OF URINE PIGMENT  NITRITE (A) 07/12/2017 0940    TEST NOT REPORTED DUE TO COLOR INTERFERENCE OF URINE PIGMENT   LEUKOCYTESUR (A) 07/12/2017 0940    TEST NOT REPORTED DUE TO COLOR INTERFERENCE OF URINE PIGMENT   Sepsis Labs: @LABRCNTIP (procalcitonin:4,lacticidven:4) )No results found for this or any previous visit (from the past 240 hour(s)).   Radiological Exams on Admission: CT ABDOMEN PELVIS W CONTRAST  Result Date: 03/09/2019 CLINICAL DATA:  Generalized abdominal pain EXAM: CT ABDOMEN AND PELVIS WITH CONTRAST TECHNIQUE: Multidetector CT imaging of the abdomen and pelvis was performed using the standard protocol following bolus administration of intravenous contrast. CONTRAST:  133mL OMNIPAQUE IOHEXOL 300 MG/ML  SOLN COMPARISON:  CT 07/07/2017 FINDINGS: Lower chest: No acute abnormality. Hepatobiliary: No focal liver abnormality is seen. No gallstones, gallbladder wall thickening, or biliary dilatation. Pancreas: Unremarkable. No pancreatic ductal dilatation or surrounding inflammatory changes. Spleen: Normal in size without focal abnormality. Adrenals/Urinary Tract: Bilateral adrenal glands are unremarkable. Chronic atrophy of the bilateral native kidneys without hydronephrosis. Simple cyst at the superior pole of the native right kidney. Ureters are unremarkable. The transplant kidney within the right lower quadrant has decreased in size compared to prior study. Heterogeneous appearance of the transplant kidney with cortical calcifications. There is a nephroureteral stent in place. Urinary bladder is circumferentially thickened and irregular in appearance with adjacent fat stranding. Stomach/Bowel: Stomach is within normal limits. Appendix not clearly visualized and may be surgically absent. Scattered colonic diverticulosis. Mildly thickened, hyperemic appearance of the rectosigmoid  colon, which may reflect a nonspecific colitis. Additionally, there is a thickened segment of transverse colon near the hepatic flexure with subtle pericolonic fat stranding and multiple mildly prominent diverticula (series 5, images 80-83). No dilated loops of bowel. Vascular/Lymphatic: Aortic atherosclerosis. No enlarged abdominal or pelvic lymph nodes. Reproductive: Prostate is unremarkable. Other: No abdominal wall hernia or abnormality. No abdominopelvic ascites. Musculoskeletal: Mild diffusely sclerotic appearance of the osseous structures suggesting renal osteodystrophy. Lucent changes within the L2 vertebral body are less prominent compared to prior. Scattered Schmorl's nodes. IMPRESSION: 1. Thickened segment of transverse colon near the hepatic flexure with subtle pericolonic fat stranding and multiple mildly prominent diverticula, suggestive of early acute diverticulitis. 2. Circumferentially thickened, hyperemic appearance of the rectosigmoid colon suggesting a nonspecific colitis. 3. 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 4. Thickened appearance of the urinary bladder, which could reflect cystitis. 5. Aortic atherosclerosis. 6. Mild diffusely sclerotic appearance of the osseous structures suggesting renal osteodystrophy. Aortic Atherosclerosis (ICD10-I70.0). Electronically Signed   By: Davina Poke M.D.   On: 03/09/2019 20:29    EKG: Not performed.   Assessment/Plan   1. Painless hematochezia; acute diverticulitis  - Presents with several episodes of painless hematochezia that began early am of 12/16  - CT findings suggest possible early diverticulitis but there is no pain, fever/chills, or leukocytosis  - He was treated with Rocephin and Flagyl in ED  - Type and screen, trend H&H, and while he does not seem to have acute diverticulitis clinically, it will be prudent to continue empiric antibiotic given his immunosuppression and  CT findings    2. ESRD  - Patient reports completing HD on 03/08/19 without incident  - There is no indication for urgent HD on admission   3. Status-post renal transplantation  - Continue Prograf   4. Hypertension; hypertensive urgency  - BP as high as 195/115 in ED, asymptomatic  - He is on several antihypertensives at home but with acute bleeding and concern  for potential worsening, plan to continue scheduled clonidine only for now and otherwise treat with labetalol IVP's as needed    DVT prophylaxis: SCD's  Code Status: Full  Family Communication: Discussed with patient  Consults called: none  Admission status: Inpatient. Patient has acute GI bleeding, possible infection, has underlying ESRD and is on immunosuppressants, placing him at increased risk for life-threatening complications and necessitating inpatient management.     Vianne Bulls, MD [facility] Pager [phone]  If 7PM-7AM, please contact night-coverage [email or web address] Password [email or web address]  03/10/2019, 1:15 AM

## 2019-03-10 NOTE — Progress Notes (Deleted)
[facility] Renal Consultation Note    Indication for Consultation:  Management of ESRD/hemodialysis; anemia, hypertension/volume and secondary hyperparathyroidism  [id number], Ebony Hail, MD  HPI: Ryan [name] is a 58 y.o. male. 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   Patient presented to the ED yesterday following an episode of hematochezia that was followed by weakness.  Reports passing large amount of blood in the toliet x2, and waking up today in the hospital bed in a pool of blood.  Denies previous episodes of BRBPR or melena.  No abdominal pain or pain with defecation.  Denies CP, SOB and n/v/d.  Admits to feeling weak and a little unsteady on his feet.  Says he has recently changed to a vegan diet and has been losing weight.  Reports he has been getting under his dry weight at dialysis.    Pertinent findings since admission include negative COVID, K 5.7, Hgb drop from 9.3 to 6.5, +FOBT and CT ABD with suspected acute diverticulitis and possible colitis.    Of note patient has intermittent compliance with prescribed dialysis regimen.  He has been getting under his dry weight for the last week except last treatment where he left 74min early and 0.5kg over.  Baseline hemoglobin has been stable at 10.5-12 with last tsat at 40%, and is on monthly mircera.  History of hyperkalemia on lokelma on non dialysis days.    Past Medical History:  Diagnosis Date  . Allergy   . Blood transfusion without reported diagnosis   . Dialysis patient ([place])    Tues,thurs,sat  . ESRD (end stage renal disease) ([place])   . Hyperlipidemia   . Hypertension    Past Surgical History:  Procedure Laterality Date  . A/V FISTULAGRAM Left 12/15/2016   Procedure: A/V Fistulagram - left;  Surgeon: Angelia Mould, MD;  Location: [place] [facility];  Service: Cardiovascular;  Laterality:  Left;  . AV FISTULA PLACEMENT Left 08/28/2016   Procedure: LEFT UPPER  ARM ARTERIOVENOUS (AV) FISTULA CREATION;  Surgeon: Angelia Mould, MD;  Location: [place];  Service: Vascular;  Laterality: Left;  . DIALYSIS/PERMA CATHETER INSERTION Right 12/21/2017   Procedure: INSERTION OF DIALYSIS CATHETER Right Internal Jugular .;  Surgeon: Elam Dutch, MD;  Location: [place];  Service: Vascular;  Laterality: Right;  . FISTULA SUPERFICIALIZATION Left 09/23/2017   Procedure: FISTULA PLICATION LEFT ARM;  Surgeon: Elam Dutch, MD;  Location: [place];  Service: Vascular;  Laterality: Left;  . FISTULOGRAM Left 12/21/2017   Procedure: FISTULOGRAM with Balloon Angioplasty.;  Surgeon: Elam Dutch, MD;  Location: [facility] OR;  Service: Vascular;  Laterality: Left;  . IR THORACENTESIS ASP PLEURAL SPACE W/IMG GUIDE  08/22/2017   1.2 L -right-sided  . IR THORACENTESIS ASP PLEURAL SPACE W/IMG GUIDE  10/19/2017  . REVISON OF ARTERIOVENOUS FISTULA Left [id number]   Procedure: PLICATION and Ligation of F LEFT ARM ARTERIOVENOUS FISTULA;  Surgeon: Elam Dutch, MD;  Location: [facility] OR;  Service: Vascular;  Laterality: Left;  . stent in kidneys     dec 2017  . TRANSTHORACIC ECHOCARDIOGRAM  09/22/2017    Severe LVH.  Normal function -EF 55-60%.  GRII DD.  Moderate RV dilation with mildly reduced RV function.   Fixed right coronary cusp with very mild aortic stenosis.  The myocardium has a speckled appearance --> .   Recommend cardiac MRI to  evaluate for amyloid   Family History  Problem Relation Age of Onset  . Hypertension Mother   . Heart disease Mother 40       By his report, he thinks that she had heart attack.  . Stroke Mother   . Cancer Father   . Kidney cancer Father   . Hypertension Sister   . Heart disease Maternal Grandmother   . Alcohol abuse Maternal Grandfather   . Mental illness Paternal Grandmother   . Learning disabilities Paternal Grandmother        Alzheimer's   . Stroke Paternal  Grandfather   . Colon cancer Neg Hx   . Colon polyps Neg Hx   . Esophageal cancer Neg Hx   . Rectal cancer Neg Hx   . Stomach cancer Neg Hx    Social History:  reports that he has never smoked. He has never used smokeless tobacco. He reports that he does not drink alcohol or use drugs. Allergies  Allergen Reactions  . Azithromycin Nausea And Vomiting and Other (See Comments)    Chest tightness   Prior to Admission medications   Medication Sig Start Date End Date Taking? Authorizing Provider  cinacalcet (SENSIPAR) 30 MG tablet Take 30 mg by mouth daily. 12/08/17  Yes [provider]  cloNIDine (CATAPRES) 0.1 MG tablet Take 1 tablet (0.1 mg total) by mouth 2 (two) times daily. 09/24/17  Yes Sheikh, Omair Latif, DO  furosemide (LASIX) 80 MG tablet TAKE 1 TABLET (80 MG TOTAL) BY MOUTH 2 (TWO) TIMES DAILY. 03/29/18  Yes Orma Flaming, MD  labetalol (NORMODYNE) 300 MG tablet TAKE 1 TABLET (300 MG TOTAL) BY MOUTH 2 (TWO) TIMES DAILY. OFFICE VISIT NEEDED Patient taking differently: Take 300 mg by mouth 2 (two) times daily.  11/21/18  Yes Leonie Man, MD  LOKELMA 10 g PACK packet Take 1 packet by mouth 3 (three) times a week. Monday, Wednesday, and Friday 02/01/19  Yes [provider]  loratadine (CLARITIN) 10 MG tablet Take 10 mg by mouth daily as needed for allergies.   Yes [provider]  losartan (COZAAR) 50 MG tablet Take 1 tablet (50 mg total) by mouth daily. 04/12/18  Yes Leonie Man, MD  multivitamin (RENA-VIT) TABS tablet Take 1 tablet by mouth daily.   Yes [provider]  NIFEdipine (PROCARDIA XL/ADALAT-CC) 60 MG 24 hr tablet Take 60 mg by mouth 2 (two) times daily.   Yes [provider]  omeprazole (PRILOSEC) 20 MG capsule TAKE 1 CAPSULE BY MOUTH EVERY DAY Patient taking differently: Take 20 mg by mouth daily.  08/10/18  Yes Mannam, Praveen, MD  pravastatin (PRAVACHOL) 80 MG tablet Take 1 tablet (80 mg total) by mouth at bedtime. 12/02/17   Yes Orma Flaming, MD  sevelamer carbonate (RENVELA) 800 MG tablet Take 1 tablet (800 mg total) by mouth 3 (three) times daily with meals. 09/24/17  Yes Sheikh, Omair Latif, DO  tacrolimus (PROGRAF) 5 MG capsule Take 5 mg by mouth 2 (two) times daily.   Yes [provider]   Current Facility-Administered Medications  Medication Dose Route Frequency Provider Last Rate Last Admin  . 0.9 %  sodium chloride infusion (Manually program via Guardrails IV Fluids)   Intravenous Once Kirby-Graham, Karsten Fells, NP      . 0.9 %  sodium chloride infusion (Manually program via Guardrails IV Fluids)   Intravenous Once Vann, Jessica U, DO      . 0.9 %  sodium chloride infusion  250  mL Intravenous PRN Opyd, Ilene Qua, MD      . acetaminophen (TYLENOL) tablet 650 mg  650 mg Oral Q6H PRN Opyd, Ilene Qua, MD       Or  . acetaminophen (TYLENOL) suppository 650 mg  650 mg Rectal Q6H PRN Opyd, Ilene Qua, MD      . Chlorhexidine Gluconate Cloth 2 % PADS 6 each  6 each Topical Q0600 Vincenza Dail, PA      . cloNIDine (CATAPRES) tablet 0.1 mg  0.1 mg Oral BID Opyd, Ilene Qua, MD   0.1 mg at 03/10/19 0830  . fentaNYL (SUBLIMAZE) injection 12.5-25 mcg  12.5-25 mcg Intravenous Q2H PRN Opyd, Ilene Qua, MD      . labetalol (NORMODYNE) injection 10 mg  10 mg Intravenous Q2H PRN Opyd, Ilene Qua, MD      . ondansetron (ZOFRAN) tablet 4 mg  4 mg Oral Q6H PRN Opyd, Ilene Qua, MD       Or  . ondansetron (ZOFRAN) injection 4 mg  4 mg Intravenous Q6H PRN Opyd, Ilene Qua, MD   4 mg at 03/10/19 0659  . piperacillin-tazobactam (ZOSYN) IVPB 2.25 g  2.25 g Intravenous Q8H Opyd, Ilene Qua, MD 100 mL/hr at 03/10/19 0648 2.25 g at 03/10/19 0648  . sodium chloride flush (NS) 0.9 % injection 3 mL  3 mL Intravenous Q12H Opyd, Ilene Qua, MD   3 mL at 03/10/19 0625  . sodium chloride flush (NS) 0.9 % injection 3 mL  3 mL Intravenous Q12H Opyd, Ilene Qua, MD   3 mL at 03/10/19 0624  . sodium chloride flush (NS) 0.9 % injection 3 mL  3  mL Intravenous PRN Opyd, Ilene Qua, MD      . sodium zirconium cyclosilicate (LOKELMA) packet 10 g  10 g Oral Once Zaide Mcclenahan, Ria Comment, PA      . tacrolimus (PROGRAF) capsule 5 mg  5 mg Oral BID Vianne Bulls, MD       Labs: Basic Metabolic Panel: Recent Labs  Lab 03/09/19 1859 03/10/19 0335  NA 142 141  K 4.6 5.7*  CL 106 107  CO2 25 24  GLUCOSE 128* 129*  BUN 28* 32*  CREATININE 8.49* 8.85*  CALCIUM 8.8* 8.1*   Liver Function Tests: Recent Labs  Lab 03/09/19 1859  AST 15  ALT 14  ALKPHOS 78  BILITOT 0.4  PROT 6.8  ALBUMIN 3.4*   CBC: Recent Labs  Lab 03/09/19 1859 03/10/19 0335  WBC 8.0  --   HGB 9.3* 6.5*  HCT 31.0* 20.7*  MCV 93.4  --   PLT 242  --    Studies/Results: CT ABDOMEN PELVIS W CONTRAST  Result Date: 03/09/2019 CLINICAL DATA:  Generalized abdominal pain EXAM: CT ABDOMEN AND PELVIS WITH CONTRAST TECHNIQUE: Multidetector CT imaging of the abdomen and pelvis was performed using the standard protocol following bolus administration of intravenous contrast. CONTRAST:  171mL OMNIPAQUE IOHEXOL 300 MG/ML  SOLN COMPARISON:  CT 07/07/2017 FINDINGS: Lower chest: No acute abnormality. Hepatobiliary: No focal liver abnormality is seen. No gallstones, gallbladder wall thickening, or biliary dilatation. Pancreas: Unremarkable. No pancreatic ductal dilatation or surrounding inflammatory changes. Spleen: Normal in size without focal abnormality. Adrenals/Urinary Tract: Bilateral adrenal glands are unremarkable. Chronic atrophy of the bilateral native kidneys without hydronephrosis. Simple cyst at the superior pole of the native right kidney. Ureters are unremarkable. The transplant kidney within the right lower quadrant has decreased in size compared to prior study. Heterogeneous appearance of the transplant kidney with cortical  calcifications. There is a nephroureteral stent in place. Urinary bladder is circumferentially thickened and irregular in appearance with adjacent  fat stranding. Stomach/Bowel: Stomach is within normal limits. Appendix not clearly visualized and may be surgically absent. Scattered colonic diverticulosis. Mildly thickened, hyperemic appearance of the rectosigmoid colon, which may reflect a nonspecific colitis. Additionally, there is a thickened segment of transverse colon near the hepatic flexure with subtle pericolonic fat stranding and multiple mildly prominent diverticula (series 5, images 80-83). No dilated loops of bowel. Vascular/Lymphatic: Aortic atherosclerosis. No enlarged abdominal or pelvic lymph nodes. Reproductive: Prostate is unremarkable. Other: No abdominal wall hernia or abnormality. No abdominopelvic ascites. Musculoskeletal: Mild diffusely sclerotic appearance of the osseous structures suggesting renal osteodystrophy. Lucent changes within the L2 vertebral body are less prominent compared to prior. Scattered Schmorl's nodes. IMPRESSION: 1. Thickened segment of transverse colon near the hepatic flexure with subtle pericolonic fat stranding and multiple mildly prominent diverticula, suggestive of early acute diverticulitis. 2. Circumferentially thickened, hyperemic appearance of the rectosigmoid colon suggesting a nonspecific colitis. 3. 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 4. Thickened appearance of the urinary bladder, which could reflect cystitis. 5. Aortic atherosclerosis. 6. Mild diffusely sclerotic appearance of the osseous structures suggesting renal osteodystrophy. Aortic Atherosclerosis (ICD10-I70.0). Electronically Signed   By: Davina Poke M.D.   On: 03/09/2019 20:29    ROS: All others negative except those listed in HPI.  Physical Exam: Vitals:   03/10/19 0255 03/10/19 0601 03/10/19 0900 03/10/19 1000  BP: (!) 149/104 126/75 136/90 132/84  Pulse: 99 96 99 89  Resp:  16 16 16   Temp: (!) 96.8 F (36 C) 97.7 F (36.5 C) 98.4 F (36.9 C) 98.9 F (37.2 C)   TempSrc: Bladder Oral Oral Oral  SpO2: 99% 100% 100% 100%  Weight: 79.6 kg     Height:         General: WDWN, NAD, well appearing male Head: NCAT sclera not icteric Neck: Supple. No lymphadenopathy Lungs: CTA bilaterally. No wheeze, rales or rhonchi. Breathing is unlabored. Heart: RRR. No murmur, rubs or gallops.  Abdomen: soft, nontender, +BS, no guarding, no rebound tenderness Lower extremities:no edema, ischemic changes, or open wounds  Neuro: AAOx3. Moves all extremities spontaneously. Psych:  Responds to questions appropriately with a normal affect. Dialysis Access: [facility]  Dialysis Orders:  TTS - [facility]  4hrs, BFR 450, DFR 800,  EDW 82.5kg, 2K/ 3.5Ca  Access: TDC  Heparin None Mircera 50 mcg q4wks - last 11/19 Hectorol 72mcg IV qHD    Last Labs: 12/10 Hgb 10.6, TSAT 40, K 5.2, Ca 8.2, P 3.4, PTH 295, Alb 4.1  Assessment/Plan: 1.  Painless hematochezia - several episodes starting yesterday, last early this AM. CT abd showed likely early acute diverticulitis.  On Zosyn.  Per primary 2. Hyperkalemia - K 5.7.  On lokelma as OP. Give 10g today.  3.  ESRD -  HD TTS.  Plan for HD today per regular schedule to allow for any possible procedures/workup that may need to be completed tomorrow.  Will be shortened to 3 hrs due to high census.   4.  Hypertension/volume  - Blood pressure well controlled on clonidine and labetalol.  Does not appear grossly volume overloaded.  Plan for net UF 1-1.5L.  Likely need to lower dry on d/c due to weight loss.  5.  ABLA on Chronic anemia of CKD - Hgb drop from 9.3>6.5. Plan for 2units pRBC. 6.  Secondary Hyperparathyroidism -  Ca  in goal. Phos in goal as OP.  Continue VDRA, sensipar and binders.   7.  Nutrition - Renal diet w/fluid restrictions once advanced.  8. Failed LDR renal transplant - on prograf  Jen Mow, PA-C [facility] Pager: [phone] 03/10/2019, 11:07 AM

## 2019-03-10 NOTE — Progress Notes (Signed)
CRITICAL VALUE ALERT  Critical Value hgb-6.5  Date & Time Notied:  03/10/2019 @ 0400  Provider Notified: Baltazar Najjar (NP)  Orders Received/Actions taken: awaiting

## 2019-03-10 NOTE — Progress Notes (Signed)
Pt taken via bed to Hemodialysis

## 2019-03-10 NOTE — Progress Notes (Addendum)
Progress Note    Ryan [name]  [id number] DOB: Aug 09, 1960  DOA: 03/09/2019 PCP: Orma Flaming, MD    Brief Narrative:     Medical records reviewed and are as summarized below:  Ryan [name] is an 58 y.o. male with medical history significant for history of renal transplant on Prograf, ESRD on hemodialysis, hypertension, and hyperlipidemia, now presenting to the emergency department for evaluation of bright red blood per rectum.  The patient reports that he had been in his usual state of health until 6 AM this morning when he suddenly felt a need to move his bowels, but passed mainly just bright red blood into the commode.  He went on to have additional episodes, some mixed with clots and stool.  He has not had any pain at all associated with this and denies any recent fevers or chills.  He has never experienced similar symptoms previously.  He denies cough, shortness of breath, chest pain, fevers, or chills.  Assessment/Plan:   Principal Problem:   Acute lower GI bleeding Active Problems:   Renal transplant, status post   ESRD on dialysis [facility])   Diverticulitis of colon with bleeding   Painless hematochezia; acute diverticulitis  - Presents with several episodes of painless hematochezia that began early am of 12/16  - CT findings suggest possible early diverticulitis - He was treated with Rocephin and Flagyl in ED - has been changed to zosyn -colonoscopy 07/31/17 by [facility]?  ABLA plus anemia of CKD -type and screen -transfuse PRBCs for Hgb of 6.5  ESRD  - Patient reports completing HD on 03/08/19 without incident  - renal consult for routine HD  Status-post renal transplantation  - Continue Prograf   Hypertension -resume home meds   Family Communication/Anticipated D/C date and plan/Code Status   DVT prophylaxis: scd Code Status: Full Code.  Family Communication:  Disposition Plan: pending improvement in symptoms-- ability to tolerate POs as  well as stable Hgb   Medical Consultants:    Nephrology   Anti-Infectives:    None  Subjective:   No pain, no further bleeding  Objective:    Vitals:   03/09/19 2345 03/10/19 0000 03/10/19 0255 03/10/19 0601  BP: (!) 188/127 (!) 176/105 (!) 149/104 126/75  Pulse: (!) 102 (!) 104 99 96  Resp: 11 18  16   Temp:   (!) 96.8 F (36 C) 97.7 F (36.5 C)  TempSrc:   Bladder Oral  SpO2: 99% 98% 99% 100%  Weight:   79.6 kg   Height:        Intake/Output Summary (Last 24 hours) at 03/10/2019 [id number] Last data filed at 03/10/2019 0300 Gross per 24 hour  Intake 100.12 ml  Output --  Net 100.12 ml   Filed Weights   03/09/19 1851 03/10/19 0255  Weight: 81.6 kg 79.6 kg    Exam: In bed, NAD rrr No increased work of breathing No LE edema +BS, soft, NT  Data Reviewed:   I have personally reviewed following labs and imaging studies:  Labs: Labs show the following:   Basic Metabolic Panel: Recent Labs  Lab 03/09/19 1859 03/10/19 0335  NA 142 141  K 4.6 5.7*  CL 106 107  CO2 25 24  GLUCOSE 128* 129*  BUN 28* 32*  CREATININE 8.49* 8.85*  CALCIUM 8.8* 8.1*   GFR Estimated Creatinine Clearance: 9.5 mL/min (A) (by C-G formula based on SCr of 8.85 mg/dL (H)). Liver Function Tests: Recent Labs  Lab 03/09/19 1859  AST 15  ALT 14  ALKPHOS 78  BILITOT 0.4  PROT 6.8  ALBUMIN 3.4*   No results for input(s): LIPASE, AMYLASE in the last 168 hours. No results for input(s): AMMONIA in the last 168 hours. Coagulation profile No results for input(s): INR, PROTIME in the last 168 hours.  CBC: Recent Labs  Lab 03/09/19 1859 03/10/19 0335  WBC 8.0  --   HGB 9.3* 6.5*  HCT 31.0* 20.7*  MCV 93.4  --   PLT 242  --    Cardiac Enzymes: No results for input(s): CKTOTAL, CKMB, CKMBINDEX, TROPONINI in the last 168 hours. BNP (last 3 results) No results for input(s): PROBNP in the last 8760 hours. CBG: No results for input(s): GLUCAP in the last 168  hours. D-Dimer: No results for input(s): DDIMER in the last 72 hours. Hgb A1c: No results for input(s): HGBA1C in the last 72 hours. Lipid Profile: No results for input(s): CHOL, HDL, LDLCALC, TRIG, CHOLHDL, LDLDIRECT in the last 72 hours. Thyroid function studies: No results for input(s): TSH, T4TOTAL, T3FREE, THYROIDAB in the last 72 hours.  Invalid input(s): FREET3 Anemia work up: No results for input(s): VITAMINB12, FOLATE, FERRITIN, TIBC, IRON, RETICCTPCT in the last 72 hours. Sepsis Labs: Recent Labs  Lab 03/09/19 1859  WBC 8.0    Microbiology Recent Results (from the past 240 hour(s))  SARS CORONAVIRUS 2 (TAT 6-24 HRS) Nasopharyngeal Nasopharyngeal Swab     Status: None   Collection Time: 03/09/19 11:34 PM   Specimen: Nasopharyngeal Swab  Result Value Ref Range Status   SARS Coronavirus 2 NEGATIVE NEGATIVE Final    Comment: (NOTE) SARS-CoV-2 target nucleic acids are NOT DETECTED. The SARS-CoV-2 RNA is generally detectable in upper and lower respiratory specimens during the acute phase of infection. Negative results do not preclude SARS-CoV-2 infection, do not rule out co-infections with other pathogens, and should not be used as the sole basis for treatment or other patient management decisions. Negative results must be combined with clinical observations, patient history, and epidemiological information. The expected result is Negative. Fact Sheet for Patients: SugarRoll.be Fact Sheet for Healthcare Providers: https://www.woods-mathews.com/ This test is not yet approved or cleared by the [place] FDA and  has been authorized for detection and/or diagnosis of SARS-CoV-2 by FDA under an Emergency Use Authorization (EUA). This EUA will remain  in effect (meaning this test can be used) for the duration of the COVID-19 declaration under Section 56 4(b)(1) of the Act, 21 U.S.C. section 360bbb-3(b)(1), unless the  authorization is terminated or revoked sooner. Performed at [facility], [place] [address] [place], [place] [address]     Procedures and diagnostic studies:  CT ABDOMEN PELVIS W CONTRAST  Result Date: 03/09/2019 CLINICAL DATA:  Generalized abdominal pain EXAM: CT ABDOMEN AND PELVIS WITH CONTRAST TECHNIQUE: Multidetector CT imaging of the abdomen and pelvis was performed using the standard protocol following bolus administration of intravenous contrast. CONTRAST:  173mL OMNIPAQUE IOHEXOL 300 MG/ML  SOLN COMPARISON:  CT 07/07/2017 FINDINGS: Lower chest: No acute abnormality. Hepatobiliary: No focal liver abnormality is seen. No gallstones, gallbladder wall thickening, or biliary dilatation. Pancreas: Unremarkable. No pancreatic ductal dilatation or surrounding inflammatory changes. Spleen: Normal in size without focal abnormality. Adrenals/Urinary Tract: Bilateral adrenal glands are unremarkable. Chronic atrophy of the bilateral native kidneys without hydronephrosis. Simple cyst at the superior pole of the native right kidney. Ureters are unremarkable. The transplant kidney within the right lower quadrant has decreased in size compared to prior study. Heterogeneous appearance  of the transplant kidney with cortical calcifications. There is a nephroureteral stent in place. Urinary bladder is circumferentially thickened and irregular in appearance with adjacent fat stranding. Stomach/Bowel: Stomach is within normal limits. Appendix not clearly visualized and may be surgically absent. Scattered colonic diverticulosis. Mildly thickened, hyperemic appearance of the rectosigmoid colon, which may reflect a nonspecific colitis. Additionally, there is a thickened segment of transverse colon near the hepatic flexure with subtle pericolonic fat stranding and multiple mildly prominent diverticula (series 5, images 80-83). No dilated loops of bowel. Vascular/Lymphatic: Aortic atherosclerosis. No enlarged abdominal  or pelvic lymph nodes. Reproductive: Prostate is unremarkable. Other: No abdominal wall hernia or abnormality. No abdominopelvic ascites. Musculoskeletal: Mild diffusely sclerotic appearance of the osseous structures suggesting renal osteodystrophy. Lucent changes within the L2 vertebral body are less prominent compared to prior. Scattered Schmorl's nodes. IMPRESSION: 1. Thickened segment of transverse colon near the hepatic flexure with subtle pericolonic fat stranding and multiple mildly prominent diverticula, suggestive of early acute diverticulitis. 2. Circumferentially thickened, hyperemic appearance of the rectosigmoid colon suggesting a nonspecific colitis. 3. 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 4. Thickened appearance of the urinary bladder, which could reflect cystitis. 5. Aortic atherosclerosis. 6. Mild diffusely sclerotic appearance of the osseous structures suggesting renal osteodystrophy. Aortic Atherosclerosis (ICD10-I70.0). Electronically Signed   By: Davina Poke M.D.   On: 03/09/2019 20:29    Medications:   . sodium chloride   Intravenous Once  . cloNIDine  0.1 mg Oral BID  . sodium chloride flush  3 mL Intravenous Q12H  . sodium chloride flush  3 mL Intravenous Q12H  . tacrolimus  5 mg Oral BID   Continuous Infusions: . sodium chloride    . piperacillin-tazobactam (ZOSYN)  IV 2.25 g (03/10/19 [id number])     LOS: 1 day   Geradine Girt  [facility]   How to contact the [facility] Attending or Consulting provider [place] or covering provider during after hours [place], for this patient?  1. Check the care team in [facility] and look for a) attending/consulting [facility] provider listed and b) the [facility] team listed 2. Log into [email or web address] and use [place]'s universal password to access. If you do not have the password, please contact the hospital operator. 3. Locate the [facility] provider you are looking for under [facility]  and page to a number that you can be directly reached. 4. If you still have difficulty reaching the provider, please page the [facility] (Director on Call) for the Hospitalists listed on amion for assistance.  03/10/2019, 9:07 AM

## 2019-03-10 NOTE — Progress Notes (Signed)
Call received from lab that pt received radiated unit of PRBC in past because he is immunocompromised. Dr Baltazar Najjar paged, returned call and requested to call Dr Myna Hidalgo. MD paged and returned call. MD reported pt needs radiated unit, awaiting new order. Blood consent signed and in chart.

## 2019-03-11 DIAGNOSIS — K5792 Diverticulitis of intestine, part unspecified, without perforation or abscess without bleeding: Secondary | ICD-10-CM

## 2019-03-11 DIAGNOSIS — D62 Acute posthemorrhagic anemia: Secondary | ICD-10-CM

## 2019-03-11 LAB — CBC
HCT: 20.7 % — ABNORMAL LOW (ref 39.0–52.0)
Hemoglobin: 6.7 g/dL — CL (ref 13.0–17.0)
MCH: 28.9 pg (ref 26.0–34.0)
MCHC: 32.4 g/dL (ref 30.0–36.0)
MCV: 89.2 fL (ref 80.0–100.0)
Platelets: 151 10*3/uL (ref 150–400)
RBC: 2.32 MIL/uL — ABNORMAL LOW (ref 4.22–5.81)
RDW: 14.7 % (ref 11.5–15.5)
WBC: 11.2 10*3/uL — ABNORMAL HIGH (ref 4.0–10.5)
nRBC: 0 % (ref 0.0–0.2)

## 2019-03-11 LAB — BASIC METABOLIC PANEL
Anion gap: 9 (ref 5–15)
BUN: 24 mg/dL — ABNORMAL HIGH (ref 6–20)
CO2: 26 mmol/L (ref 22–32)
Calcium: 8.8 mg/dL — ABNORMAL LOW (ref 8.9–10.3)
Chloride: 102 mmol/L (ref 98–111)
Creatinine, Ser: 6.96 mg/dL — ABNORMAL HIGH (ref 0.61–1.24)
GFR calc Af Amer: 9 mL/min — ABNORMAL LOW (ref >60)
GFR calc non Af Amer: 8 mL/min — ABNORMAL LOW (ref >60)
Glucose, Bld: 97 mg/dL (ref 70–99)
Potassium: 4 mmol/L (ref 3.5–5.1)
Sodium: 137 mmol/L (ref 135–145)

## 2019-03-11 LAB — PREPARE RBC (CROSSMATCH)

## 2019-03-11 LAB — HEMOGLOBIN AND HEMATOCRIT, BLOOD
HCT: 22.5 % — ABNORMAL LOW (ref 39.0–52.0)
Hemoglobin: 7.5 g/dL — ABNORMAL LOW (ref 13.0–17.0)

## 2019-03-11 MED ORDER — SODIUM CHLORIDE 0.9% IV SOLUTION: Freq: Once | INTRAVENOUS | Status: AC

## 2019-03-11 MED ORDER — HEPARIN SODIUM (PORCINE) 1000 UNIT/ML IJ SOLN
INTRAMUSCULAR | Status: AC
  Filled 2019-03-11: qty 4

## 2019-03-11 MED ORDER — PENTAFLUOROPROP-TETRAFLUOROETH EX AERO: 1.0000 "application " | INHALATION_SPRAY | CUTANEOUS | Status: DC | PRN

## 2019-03-11 MED ORDER — SODIUM CHLORIDE 0.9 % IV SOLN: 100.0000 mL | INTRAVENOUS | Status: DC | PRN

## 2019-03-11 MED ORDER — CHLORHEXIDINE GLUCONATE CLOTH 2 % EX PADS
6.0000 | MEDICATED_PAD | Freq: Every day | CUTANEOUS | Status: DC
  Administered 2019-03-12: 6 via TOPICAL

## 2019-03-11 MED ORDER — LIDOCAINE-PRILOCAINE 2.5-2.5 % EX CREA: 1.0000 "application " | TOPICAL_CREAM | CUTANEOUS | Status: DC | PRN

## 2019-03-11 MED ORDER — DARBEPOETIN ALFA 60 MCG/0.3ML IJ SOSY
60.0000 ug | PREFILLED_SYRINGE | INTRAMUSCULAR | Status: DC
  Administered 2019-03-12: 60 ug via INTRAVENOUS

## 2019-03-11 MED ORDER — LIDOCAINE HCL (PF) 1 % IJ SOLN: 5.0000 mL | INTRAMUSCULAR | Status: DC | PRN

## 2019-03-11 MED ORDER — HEPARIN SODIUM (PORCINE) 1000 UNIT/ML DIALYSIS: 1000.0000 [IU] | INTRAMUSCULAR | Status: DC | PRN

## 2019-03-11 MED ORDER — ALTEPLASE 2 MG IJ SOLR: 2.0000 mg | Freq: Once | INTRAMUSCULAR | Status: DC | PRN

## 2019-03-11 NOTE — Progress Notes (Signed)
Pt back from HD via bed in stable condition.

## 2019-03-11 NOTE — Progress Notes (Signed)
Attempted 2x to hang pt ordered blood transfusion, each time blood is scanned into epic it states that " the blood does not match the transfusion order". Notified the MD the first time and he placed a new order and NT went to go and get the new blood but still no change. I called blood bank and they stated that everything matched up on their end so they are not sure why epic is stating that it is a mismatch. Charge RN aware this is why the blood has not been started, will notify MD again. Pt also aware of situation and is comfortable at the moment do lightheadedness or dizziness.

## 2019-03-11 NOTE — Consult Note (Signed)
Referring Provider:  Dr. Algis Liming Primary Care Physician:  Orma Flaming, MD Primary Gastroenterologist: None (unassigned)  Reason for Consultation: Hematochezia, possible diverticulitis  HPI: Ryan [name] is a 58 y.o. male with end stage renal disease due to hypertension, 11 years status post kidney transplant, which failed because of medication noncompliance when he lost disability and drug coverage for his antirejection medications.    He was admitted to the hospital 2 days ago with his first-ever episode of lower GI bleeding, characterized by multiple episodes of painless hematochezia of fairly bright red blood.  No abdominal pain whatsoever, just a slight gurgling sensation.  On Wednesday and Thursday, he probably have a total of about 10-12 episodes of the hematochezia, such that by yesterday morning (Thursday), he was feeling quite weak although not frankly syncopal.  He received 1 unit of packed cells yesterday and his just about to receive another unit today.  His bleeding has essentially stopped over the past 12 to 24 hours, with just minimal output and tiny clots, by his report.  However, today's hemoglobin of 6.7 is only marginally increased from yesterday morning's value, despite transfusion of 1 unit of packed cells in between.  A CT scan obtained during this admission shows scattered diverticulosis with thickening suggesting possible diverticulitis in the region of the hepatic flexure, as well as some rectosigmoid mucosal thickening of uncertain clinical significance.  The patient has no history of ulcer disease or GI bleeding, but is on a daily full dose aspirin as an outpatient.  He thinks he may have had a colonoscopy several years ago at [facility] but review of care everywhere does not disclose any records pertaining to that that I can find.   Past Medical History:  Diagnosis Date  . Allergy   . Blood transfusion without reported diagnosis   . Dialysis patient  ([place])    Tues,thurs,sat  . ESRD (end stage renal disease) ([place])   . Hyperlipidemia   . Hypertension     Past Surgical History:  Procedure Laterality Date  . A/V FISTULAGRAM Left 12/15/2016   Procedure: A/V Fistulagram - left;  Surgeon: Angelia Mould, MD;  Location: [facility];  Service: Cardiovascular;  Laterality: Left;  . AV FISTULA PLACEMENT Left 08/28/2016   Procedure: LEFT UPPER  ARM ARTERIOVENOUS (AV) FISTULA CREATION;  Surgeon: Angelia Mould, MD;  Location: [facility];  Service: Vascular;  Laterality: Left;  . DIALYSIS/PERMA CATHETER INSERTION Right 12/21/2017   Procedure: INSERTION OF DIALYSIS CATHETER Right Internal Jugular .;  Surgeon: Elam Dutch, MD;  Location: [facility];  Service: Vascular;  Laterality: Right;  . FISTULA SUPERFICIALIZATION Left 09/23/2017   Procedure: FISTULA PLICATION LEFT ARM;  Surgeon: Elam Dutch, MD;  Location: [facility];  Service: Vascular;  Laterality: Left;  . FISTULOGRAM Left 12/21/2017   Procedure: FISTULOGRAM with Balloon Angioplasty.;  Surgeon: Elam Dutch, MD;  Location: [facility] OR;  Service: Vascular;  Laterality: Left;  . IR THORACENTESIS ASP PLEURAL SPACE W/IMG GUIDE  08/22/2017   1.2 L -right-sided  . IR THORACENTESIS ASP PLEURAL SPACE W/IMG GUIDE  10/19/2017  . REVISON OF ARTERIOVENOUS FISTULA Left [id number]   Procedure: PLICATION and Ligation of F LEFT ARM ARTERIOVENOUS FISTULA;  Surgeon: Elam Dutch, MD;  Location: [facility] OR;  Service: Vascular;  Laterality: Left;  . stent in kidneys     dec 2017  . TRANSTHORACIC ECHOCARDIOGRAM  09/22/2017    Severe LVH.  Normal function -EF 55-60%.  GRII DD.  Moderate  RV dilation with mildly reduced RV function.   Fixed right coronary cusp with very mild aortic stenosis.  The myocardium has a speckled appearance --> .   Recommend cardiac MRI to evaluate for amyloid    Prior to Admission medications   Medication Sig Start Date End Date Taking? Authorizing Provider  cinacalcet  (SENSIPAR) 30 MG tablet Take 30 mg by mouth daily. 12/08/17  Yes [provider]  cloNIDine (CATAPRES) 0.1 MG tablet Take 1 tablet (0.1 mg total) by mouth 2 (two) times daily. 09/24/17  Yes Sheikh, Omair Latif, DO  furosemide (LASIX) 80 MG tablet TAKE 1 TABLET (80 MG TOTAL) BY MOUTH 2 (TWO) TIMES DAILY. 03/29/18  Yes Orma Flaming, MD  labetalol (NORMODYNE) 300 MG tablet TAKE 1 TABLET (300 MG TOTAL) BY MOUTH 2 (TWO) TIMES DAILY. OFFICE VISIT NEEDED Patient taking differently: Take 300 mg by mouth 2 (two) times daily.  11/21/18  Yes Leonie Man, MD  LOKELMA 10 g PACK packet Take 1 packet by mouth 3 (three) times a week. Monday, Wednesday, and Friday 02/01/19  Yes [provider]  loratadine (CLARITIN) 10 MG tablet Take 10 mg by mouth daily as needed for allergies.   Yes [provider]  losartan (COZAAR) 50 MG tablet Take 1 tablet (50 mg total) by mouth daily. 04/12/18  Yes Leonie Man, MD  multivitamin (RENA-VIT) TABS tablet Take 1 tablet by mouth daily.   Yes [provider]  NIFEdipine (PROCARDIA XL/ADALAT-CC) 60 MG 24 hr tablet Take 60 mg by mouth 2 (two) times daily.   Yes [provider]  omeprazole (PRILOSEC) 20 MG capsule TAKE 1 CAPSULE BY MOUTH EVERY DAY Patient taking differently: Take 20 mg by mouth daily.  08/10/18  Yes Mannam, Praveen, MD  pravastatin (PRAVACHOL) 80 MG tablet Take 1 tablet (80 mg total) by mouth at bedtime. 12/02/17  Yes Orma Flaming, MD  sevelamer carbonate (RENVELA) 800 MG tablet Take 1 tablet (800 mg total) by mouth 3 (three) times daily with meals. 09/24/17  Yes Sheikh, Omair Latif, DO  tacrolimus (PROGRAF) 5 MG capsule Take 5 mg by mouth 2 (two) times daily.   Yes [provider]    Current Facility-Administered Medications  Medication Dose Route Frequency Provider Last Rate Last Admin  . 0.9 %  sodium chloride infusion (Manually program via Guardrails IV Fluids)   Intravenous Once Kirby-Graham, Karsten Fells, NP       . 0.9 %  sodium chloride infusion (Manually program via Guardrails IV Fluids)   Intravenous Once Geradine Girt, DO 10 mL/hr at 03/10/19 1824 10 mL/hr at 03/10/19 1824  . 0.9 %  sodium chloride infusion (Manually program via Guardrails IV Fluids)   Intravenous Once Hongalgi, Anand D, MD      . 0.9 %  sodium chloride infusion  250 mL Intravenous PRN Opyd, Ilene Qua, MD      . 0.9 %  sodium chloride infusion  100 mL Intravenous PRN Penninger, Ria Comment, PA      . 0.9 %  sodium chloride infusion  100 mL Intravenous PRN Penninger, Ria Comment, PA      . acetaminophen (TYLENOL) tablet 650 mg  650 mg Oral Q6H PRN Opyd, Ilene Qua, MD       Or  . acetaminophen (TYLENOL) suppository 650 mg  650 mg Rectal Q6H PRN Opyd, Ilene Qua, MD      . alteplase (CATHFLO ACTIVASE) injection 2 mg  2 mg Intracatheter Once PRN Penninger, Ria Comment, PA      .  Chlorhexidine Gluconate Cloth 2 % PADS 6 each  6 each Topical Q0600 Penninger, [place], PA   6 each at 03/10/19 1000  . cinacalcet (SENSIPAR) tablet 30 mg  30 mg Oral Q supper Penninger, [place], PA   30 mg at 03/10/19 1823  . cloNIDine (CATAPRES) tablet 0.1 mg  0.1 mg Oral BID Opyd, Ilene Qua, MD   0.1 mg at 03/11/19 0848  . [START ON 03/12/2019] doxercalciferol (HECTOROL) injection 1 mcg  1 mcg Intravenous Q T,Th,Sa-HD Penninger, Ria Comment, PA      . fentaNYL (SUBLIMAZE) injection 12.5-25 mcg  12.5-25 mcg Intravenous Q2H PRN Opyd, Ilene Qua, MD      . heparin injection 1,000 Units  1,000 Units Dialysis PRN Penninger, Ria Comment, PA      . labetalol (NORMODYNE) injection 10 mg  10 mg Intravenous Q2H PRN Opyd, Ilene Qua, MD      . lidocaine (PF) (XYLOCAINE) 1 % injection 5 mL  5 mL Intradermal PRN Penninger, Ria Comment, PA      . lidocaine-prilocaine (EMLA) cream 1 application  1 application Topical PRN Penninger, Ria Comment, PA      . ondansetron (ZOFRAN) tablet 4 mg  4 mg Oral Q6H PRN Opyd, Ilene Qua, MD       Or  . ondansetron (ZOFRAN) injection 4 mg  4 mg Intravenous Q6H PRN  Opyd, Ilene Qua, MD   4 mg at 03/10/19 0659  . pentafluoroprop-tetrafluoroeth (GEBAUERS) aerosol 1 application  1 application Topical PRN Penninger, Ria Comment, PA      . piperacillin-tazobactam (ZOSYN) IVPB 2.25 g  2.25 g Intravenous Q8H Opyd, Ilene Qua, MD 100 mL/hr at 03/11/19 0604 2.25 g at 03/11/19 0604  . sevelamer carbonate (RENVELA) tablet 2,400 mg  2,400 mg Oral TID WC Penninger, [place], PA   2,400 mg at 03/11/19 0849  . sodium chloride flush (NS) 0.9 % injection 3 mL  3 mL Intravenous Q12H Opyd, Ilene Qua, MD   3 mL at 03/11/19 0850  . sodium chloride flush (NS) 0.9 % injection 3 mL  3 mL Intravenous Q12H Opyd, Ilene Qua, MD   3 mL at 03/11/19 0849  . sodium chloride flush (NS) 0.9 % injection 3 mL  3 mL Intravenous PRN Opyd, Ilene Qua, MD      . tacrolimus (PROGRAF) capsule 5 mg  5 mg Oral BID Opyd, Ilene Qua, MD   5 mg at 03/11/19 0848    Allergies as of 03/09/2019 - Review Complete 03/09/2019  Allergen Reaction Noted  . Azithromycin Nausea And Vomiting and Other (See Comments) 08/30/2017    Family History  Problem Relation Age of Onset  . Hypertension Mother   . Heart disease Mother 67       By his report, he thinks that she had heart attack.  . Stroke Mother   . Cancer Father   . Kidney cancer Father   . Hypertension Sister   . Heart disease Maternal Grandmother   . Alcohol abuse Maternal Grandfather   . Mental illness Paternal Grandmother   . Learning disabilities Paternal Grandmother        Alzheimer's   . Stroke Paternal Grandfather   . Colon cancer Neg Hx   . Colon polyps Neg Hx   . Esophageal cancer Neg Hx   . Rectal cancer Neg Hx   . Stomach cancer Neg Hx     Social History   Socioeconomic History  . Marital status: Divorced    Spouse name: Not on file  . Number of  children: Not on file  . Years of education: Not on file  . Highest education level: Not on file  Occupational History  . Not on file  Tobacco Use  . Smoking status: Never Smoker  .  Smokeless tobacco: Never Used  Substance and Sexual Activity  . Alcohol use: No  . Drug use: No  . Sexual activity: Not Currently    Birth control/protection: None  Other Topics Concern  . Not on file  Social History Narrative   Divorced   Does Chiropodist work   3 years of college.   Does not drink, does not smoke.  Does not use illicit drugs.   Son in nursing school at [employer] Determinants of Health   Financial Resource Strain:   . Difficulty of Paying Living Expenses: Not on file  Food Insecurity:   . Worried About Charity fundraiser in the Last Year: Not on file  . Ran Out of Food in the Last Year: Not on file  Transportation Needs:   . Lack of Transportation (Medical): Not on file  . Lack of Transportation (Non-Medical): Not on file  Physical Activity:   . Days of Exercise per Week: Not on file  . Minutes of Exercise per Session: Not on file  Stress:   . Feeling of Stress : Not on file  Social Connections:   . Frequency of Communication with Friends and Family: Not on file  . Frequency of Social Gatherings with Friends and Family: Not on file  . Attends Religious Services: Not on file  . Active Member of Clubs or Organizations: Not on file  . Attends Archivist Meetings: Not on file  . Marital Status: Not on file  Intimate Partner Violence:   . Fear of Current or Ex-Partner: Not on file  . Emotionally Abused: Not on file  . Physically Abused: Not on file  . Sexually Abused: Not on file    Review of Systems: Negative globally, specifically no chest pain, shortness of breath, bruising or bleeding elsewhere other than per rectum, no upper GI tract symptoms such as dysphagia, nausea, abdominal discomfort, reflux symptoms, no skin rashes or arthropathy, no lower extremity edema, no focal neurologic symptoms  Physical Exam: Vital signs in last 24 hours: Temp:  [97.7 F (36.5 C)-99.2 F (37.3 C)] 98.7 F (37.1 C) (12/18 0921) Pulse Rate:   [83-93] 84 (12/18 0921) Resp:  [15-18] 17 (12/18 0921) BP: (118-165)/(59-89) 151/88 (12/18 0921) SpO2:  [98 %-100 %] 99 % (12/18 0921) Weight:  [80 kg-81.5 kg] 80 kg (12/18 0531) Last BM Date: 03/10/19  Pleasant, articulate African-American male lying in bed in absolutely no distress.  There is a prominent graft in his left upper arm.  Skin is warm and dry.  Radial pulse is rather weak.  Mild conjunctival and palmar pallor.  No icterus.  Chest clear, heart has a soft systolic murmur with no arrhythmia.  Abdomen is soft, and nontender, no masses.  Extremities are without edema.  Mood and cognition are normal and there are no evident focal neurologic deficits.  Intake/Output from previous day: 12/17 0701 - 12/18 0700 In: 520 [P.O.:120; I.V.:50; Blood:350] Out: 1500  Intake/Output this shift: No intake/output data recorded.  Lab Results: Recent Labs    03/09/19 1859 03/10/19 0335 03/11/19 0342  WBC 8.0  --  11.2*  HGB 9.3* 6.5* 6.7*  HCT 31.0* 20.7* 20.7*  PLT 242  --  151   BMET Recent Labs  03/09/19 1859 03/10/19 0335 03/11/19 0342  NA 142 141 137  K 4.6 5.7* 4.0  CL 106 107 102  CO2 25 24 26   GLUCOSE 128* 129* 97  BUN 28* 32* 24*  CREATININE 8.49* 8.85* 6.96*  CALCIUM 8.8* 8.1* 8.8*   LFT Recent Labs    03/09/19 1859  PROT 6.8  ALBUMIN 3.4*  AST 15  ALT 14  ALKPHOS 78  BILITOT 0.4   PT/INR No results for input(s): LABPROT, INR in the last 72 hours.  Studies/Results: CT ABDOMEN PELVIS W CONTRAST  Result Date: 03/09/2019 CLINICAL DATA:  Generalized abdominal pain EXAM: CT ABDOMEN AND PELVIS WITH CONTRAST TECHNIQUE: Multidetector CT imaging of the abdomen and pelvis was performed using the standard protocol following bolus administration of intravenous contrast. CONTRAST:  187mL OMNIPAQUE IOHEXOL 300 MG/ML  SOLN COMPARISON:  CT 07/07/2017 FINDINGS: Lower chest: No acute abnormality. Hepatobiliary: No focal liver abnormality is seen. No gallstones,  gallbladder wall thickening, or biliary dilatation. Pancreas: Unremarkable. No pancreatic ductal dilatation or surrounding inflammatory changes. Spleen: Normal in size without focal abnormality. Adrenals/Urinary Tract: Bilateral adrenal glands are unremarkable. Chronic atrophy of the bilateral native kidneys without hydronephrosis. Simple cyst at the superior pole of the native right kidney. Ureters are unremarkable. The transplant kidney within the right lower quadrant has decreased in size compared to prior study. Heterogeneous appearance of the transplant kidney with cortical calcifications. There is a nephroureteral stent in place. Urinary bladder is circumferentially thickened and irregular in appearance with adjacent fat stranding. Stomach/Bowel: Stomach is within normal limits. Appendix not clearly visualized and may be surgically absent. Scattered colonic diverticulosis. Mildly thickened, hyperemic appearance of the rectosigmoid colon, which may reflect a nonspecific colitis. Additionally, there is a thickened segment of transverse colon near the hepatic flexure with subtle pericolonic fat stranding and multiple mildly prominent diverticula (series 5, images 80-83). No dilated loops of bowel. Vascular/Lymphatic: Aortic atherosclerosis. No enlarged abdominal or pelvic lymph nodes. Reproductive: Prostate is unremarkable. Other: No abdominal wall hernia or abnormality. No abdominopelvic ascites. Musculoskeletal: Mild diffusely sclerotic appearance of the osseous structures suggesting renal osteodystrophy. Lucent changes within the L2 vertebral body are less prominent compared to prior. Scattered Schmorl's nodes. IMPRESSION: 1. Thickened segment of transverse colon near the hepatic flexure with subtle pericolonic fat stranding and multiple mildly prominent diverticula, suggestive of early acute diverticulitis. 2. Circumferentially thickened, hyperemic appearance of the rectosigmoid colon suggesting a nonspecific  colitis. 3. 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 4. Thickened appearance of the urinary bladder, which could reflect cystitis. 5. Aortic atherosclerosis. 6. Mild diffusely sclerotic appearance of the osseous structures suggesting renal osteodystrophy. Aortic Atherosclerosis (ICD10-I70.0). Electronically Signed   By: Davina Poke M.D.   On: 03/09/2019 20:29    Impression: 1.  Hematochezia, almost certainly diverticular in origin, although with chronic aspirin therapy, intestinal ulcerations would be another possibility.  Doubt upper tract source given the red character of the blood without frank hemodynamic instability.  It appears the bleeding has stopped and that the absence of a significant rise in hemoglobin following yesterday's transfusion reflects equilibration. 2.  Acute posthemorrhagic anemia 3.  Thickening of the colon near the hepatic flexure with some pericolonic stranding suggestive of possible diverticulitis, although he does not have symptoms or exam findings to support that diagnosis.  Plan: 1.  Continue supportive care.  Agree with upcoming transfusion 2.  Although I am not convinced that this patient has diverticulitis, I do think that several days of  antibiotic therapy is reasonable. 3.  As I explained to the patient, the treatment for diverticular bleeding is typically support and observation, since it usually stops on its own.  However, there would be a role for urgent colonoscopy if the diagnosis remains in doubt (eg, persistent bleeding). 4.  The patient should have elective outpatient colonoscopy, perhaps a month or so from now, because we cannot clearly establish that he has had one in the past, and he is over age 55 so he needs it for screening purposes, plus it would be helpful to confirm the absence of alternative pathology (ulceration, colitis, neoplasia) in the colon even though these seem to be improbable both  clinically and radiographically.  I have explained this to the patient and given him my card to facilitate outpatient follow-up. 5.  Dr. Therisa Doyne will be seeing the patient in our service over the weekend. 6.  Since it does not appear likely that the patient will need colonoscopy on this admission, I have gone ahead and ordered a solid diet for the patient. 7.  It is always difficult to know when to discharge patients with (apparent) diverticular bleeding, because it will often resume after a 1 to 2-day interval of quiescence.  I think that discharge tomorrow would be reasonable if the patient's hemoglobin comes up appropriately with transfusion and he shows no signs of further bleeding.   LOS: 2 days   Ryan [name] Onur Mori  03/11/2019, 9:54 AM   Pager [phone] If no answer or after 5 PM call [phone]

## 2019-03-11 NOTE — Progress Notes (Addendum)
[facility] Progress Note   Subjective:   Patient seen and examined at bedside.  Reports he is feeling better.  Passed a few clots overnight, but no additional bleeding episodes.  Denies weakness, dizziness, CP, SOB, n/v/d.   Objective Vitals:   03/11/19 0531 03/11/19 0921 03/11/19 1001 03/11/19 1229  BP: (!) 165/86 (!) 151/88 140/76 129/74  Pulse: 91 84 81 80  Resp: 17 17 17 18   Temp: 97.7 F (36.5 C) 98.7 F (37.1 C) 99 F (37.2 C) 97.8 F (36.6 C)  TempSrc: Oral Oral Oral Oral  SpO2: 100% 99% 99% 100%  Weight: 80 kg     Height:       Physical Exam General:NAD, well appearing male Heart:RRR Lungs:CTAB Abdomen:soft, NTND Extremities: no LE edema Dialysis Access: [facility]   Filed Weights   03/10/19 2305 03/11/19 0210 03/11/19 0531  Weight: 81.5 kg 80 kg 80 kg    Intake/Output Summary (Last 24 hours) at 03/11/2019 1329 Last data filed at 03/11/2019 0400 Gross per 24 hour  Intake 520 ml  Output 1500 ml  Net -980 ml    Additional Objective Labs: Basic Metabolic Panel: Recent Labs  Lab 03/09/19 1859 03/10/19 0335 03/11/19 0342  NA 142 141 137  K 4.6 5.7* 4.0  CL 106 107 102  CO2 25 24 26   GLUCOSE 128* 129* 97  BUN 28* 32* 24*  CREATININE 8.49* 8.85* 6.96*  CALCIUM 8.8* 8.1* 8.8*   Liver Function Tests: Recent Labs  Lab 03/09/19 1859  AST 15  ALT 14  ALKPHOS 78  BILITOT 0.4  PROT 6.8  ALBUMIN 3.4*   CBC: Recent Labs  Lab 03/09/19 1859 03/10/19 0335 03/11/19 0342  WBC 8.0  --  11.2*  HGB 9.3* 6.5* 6.7*  HCT 31.0* 20.7* 20.7*  MCV 93.4  --  89.2  PLT 242  --  151   Blood Culture    Component Value Date/Time   SDES FLUID RIGHT PLEURAL 10/19/2017 1059   SPECREQUEST NO BACTERIA CULTURE ORDERED,PER DR Eliseo Squires 10/19/2017 1059   CULT NO GROWTH 10/07/2016 0907   REPTSTATUS 10/19/2017 FINAL 10/19/2017 1059     Lab Results  Component Value Date   INR 1.13 09/23/2017   INR 1.01 08/28/2016   Studies/Results: CT ABDOMEN PELVIS W  CONTRAST  Result Date: 03/09/2019 CLINICAL DATA:  Generalized abdominal pain EXAM: CT ABDOMEN AND PELVIS WITH CONTRAST TECHNIQUE: Multidetector CT imaging of the abdomen and pelvis was performed using the standard protocol following bolus administration of intravenous contrast. CONTRAST:  167mL OMNIPAQUE IOHEXOL 300 MG/ML  SOLN COMPARISON:  CT 07/07/2017 FINDINGS: Lower chest: No acute abnormality. Hepatobiliary: No focal liver abnormality is seen. No gallstones, gallbladder wall thickening, or biliary dilatation. Pancreas: Unremarkable. No pancreatic ductal dilatation or surrounding inflammatory changes. Spleen: Normal in size without focal abnormality. Adrenals/Urinary Tract: Bilateral adrenal glands are unremarkable. Chronic atrophy of the bilateral native kidneys without hydronephrosis. Simple cyst at the superior pole of the native right kidney. Ureters are unremarkable. The transplant kidney within the right lower quadrant has decreased in size compared to prior study. Heterogeneous appearance of the transplant kidney with cortical calcifications. There is a nephroureteral stent in place. Urinary bladder is circumferentially thickened and irregular in appearance with adjacent fat stranding. Stomach/Bowel: Stomach is within normal limits. Appendix not clearly visualized and may be surgically absent. Scattered colonic diverticulosis. Mildly thickened, hyperemic appearance of the rectosigmoid colon, which may reflect a nonspecific colitis. Additionally, there is a thickened segment of transverse colon near the  hepatic flexure with subtle pericolonic fat stranding and multiple mildly prominent diverticula (series 5, images 80-83). No dilated loops of bowel. Vascular/Lymphatic: Aortic atherosclerosis. No enlarged abdominal or pelvic lymph nodes. Reproductive: Prostate is unremarkable. Other: No abdominal wall hernia or abnormality. No abdominopelvic ascites. Musculoskeletal: Mild diffusely sclerotic appearance  of the osseous structures suggesting renal osteodystrophy. Lucent changes within the L2 vertebral body are less prominent compared to prior. Scattered Schmorl's nodes. IMPRESSION: 1. Thickened segment of transverse colon near the hepatic flexure with subtle pericolonic fat stranding and multiple mildly prominent diverticula, suggestive of early acute diverticulitis. 2. Circumferentially thickened, hyperemic appearance of the rectosigmoid colon suggesting a nonspecific colitis. 3. 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 4. Thickened appearance of the urinary bladder, which could reflect cystitis. 5. Aortic atherosclerosis. 6. Mild diffusely sclerotic appearance of the osseous structures suggesting renal osteodystrophy. Aortic Atherosclerosis (ICD10-I70.0). Electronically Signed   By: Davina Poke M.D.   On: 03/09/2019 20:29    Medications: . sodium chloride    . sodium chloride    . sodium chloride    . piperacillin-tazobactam (ZOSYN)  IV 2.25 g (03/11/19 0604)   . Chlorhexidine Gluconate Cloth  6 each Topical Q0600  . cinacalcet  30 mg Oral Q supper  . cloNIDine  0.1 mg Oral BID  . [START ON 03/12/2019] doxercalciferol  1 mcg Intravenous Q T,Th,Sa-HD  . sevelamer carbonate  2,400 mg Oral TID WC  . sodium chloride flush  3 mL Intravenous Q12H  . sodium chloride flush  3 mL Intravenous Q12H  . tacrolimus  5 mg Oral BID    Dialysis Orders: TTS - [facility]  4hrs, BFR 450, DFR 800,  EDW 82.5kg, 2K/ 3.5Ca  Access: TDC  Heparin None Mircera 50 mcg q4wks - last 11/19 Hectorol 11mcg IV qHD    Last Labs: 12/10 Hgb 10.6, TSAT 40, K 5.2, Ca 8.2, P 3.4, PTH 295, Alb 4.1  Assessment/Plan: 1.  Painless hematochezia - several episodes starting yesterday, last early this AM. CT abd showed likely early acute diverticulitis.  On Zosyn.  GI consulted, rec OP colonoscopy in about 1 month w/d/c tomorrow if Hgb improving.  2. Hyperkalemia -  improved. K 4.0 today. 3.  ESRD -  HD TTS.  HD tomorrow per regular schedule.  Will be shortened to 3 hrs due to high census. K 4.0.  4.  Hypertension/volume  - Blood pressure well controlled on clonidine and labetalol.  Does not appear grossly volume overloaded.  Net UF 1.5L removed. Post wt 80kg, will need new EDW at d/c.  5.  ABLA on Chronic anemia of CKD - Hgb drop from 9.3>6.5>6.7.  2 unit pRBC transfused yesterday, plan for additional 1 unit today.   Aranesp ordered with HD tomorrow, 71mcg.   6.  Secondary Hyperparathyroidism -  Ca in goal. Phos in goal as OP.  Continue VDRA, sensipar and binders.   7.  Nutrition - Renal diet w/fluid restrictions once advanced.  8. 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  He feels well.  States that he is usually first shift so would not make it to his outpatient appt in time. Plan for HD here tomorrow unless we can arrange second shift tomorrow and his hgb is stable  for discharge.  Governor Rooks Corrinne Benegas,MD 03/11/2019 6:24 PM

## 2019-03-11 NOTE — Progress Notes (Signed)
PROGRESS NOTE   Ryan [name]  [id number]    DOB: 10-07-1960    DOA: 03/09/2019  PCP: Orma Flaming, MD   I have briefly reviewed patients previous medical records in [facility].  Chief Complaint:   Chief Complaint  Patient presents with  . Melena    Brief Narrative:  58 year old male with PMH of failed renal transplant on Prograf, ESRD on TTS HD via tunneled RIJ HD catheter due to failed LUE AVF, HTN, HLD, presented to [facility] ED due to multiple episodes of bright red bleeding per rectum with clots on 12/16, eventually causing him to become weak.  In the ED, initial hemoglobin was 9.3 which dropped down to a low of 6.5.  CT abdomen showed diverticulosis with thickening suggesting possible diverticulitis in the region of the hepatic flexure and some rectosigmoid mucosal thickening of uncertain clinical significance.  He was admitted for suspected acute diverticular bleed, possible acute diverticulitis and acute blood loss anemia.  Transfusing PRBCs.  Bleeding seems to have stopped.  [facility] consulted, diet advanced, hopeful discharge 12/19.   Assessment & Plan:  Principal Problem:   Acute lower GI bleeding Active Problems:   Renal transplant, status post   ESRD on dialysis [facility])   Diverticulitis of colon with bleeding   Acute lower GI bleed, suspected diverticular bleed.  History as noted above.  Patient on aspirin PTA.  Patient unsure but feels that he may have had colonoscopy at [facility].  As discussed with [facility] team, patient to undergo transplant usually have screening colonoscopy.  However unable to find report of colonoscopy in [facility] or in care everywhere.  Clinically bleeding seems to have stopped.  Discussed with [facility] and Dr. Buccini's thoughtful input appreciated.  Plans to advance diet to regular, continue antibiotics, possible discharge home tomorrow with outpatient follow-up with them in about a month's time for consideration for screening  colonoscopy.  Continue to monitor.  Aspirin on hold.  Suspected acute diverticulitis  Continue empirically started IV Zosyn.  Possible oral Augmentin versus Cipro/Flagyl at discharge.  Acute blood loss anemia complicating anemia of CKD  Secondary to GI bleed.  Baseline hemoglobin probably in the 10 g range.  Initial hemoglobin was 9.3 but quickly dropped to a low of 6.5.  Transfused 1 unit of PRBC on 12/17 at dialysis.  Hemoglobin only up to 6.7, may be equilibrating or truly low.  Transfusing second unit of PRBC today.  Patient agreeable.  Some difficulty in getting transfusion orders right despite multiple attempts.  Nursing coordinating with blood bank regarding same.  I discussed with nephrology team who agree with transfusion.  Follow posttransfusion CBC and transfuse if hemoglobin 7 g or less.  ESRD on TTS HD  Via RIJ HD catheter due to failed LUE AVG  Had HD on 12/17.  Next HD on 12/19.  Mild hyperkalemia from yesterday resolved.  Metabolically and volume wise stable.  S/p failed renal transplant  Continue tacrolimus.  Essential hypertension  Reasonably controlled.  Continue clonidine.  Volume management across dialysis.   DVT prophylaxis: SCDs Code Status: Full Family Communication: None at bedside Disposition: DC home pending clinical improvement, hopefully 12/19 pending no further GI bleed and post dialysis.   Consultants:   Nephrology [facility]  Procedures:   HD 12/17  Antimicrobials:   IV Zosyn   Subjective:  Patient feels much better.  No significant rectal bleeding in the last 24 hours.  Last night had mostly flatus.  This morning had  her tiny blood clot but no stool or active bleeding.  Denies dyspnea, chest pain, palpitations, dizziness or lightheadedness.  Denies feeling like passing out or passing out since this episode.  Objective:   Vitals:   03/11/19 0210 03/11/19 0531 03/11/19 0921 03/11/19 1001  BP: 138/76 (!) 165/86 (!) 151/88 140/76   Pulse: 87 91 84 81  Resp: 18 17 17 17   Temp: 98.2 F (36.8 C) 97.7 F (36.5 C) 98.7 F (37.1 C) 99 F (37.2 C)  TempSrc: Oral Oral Oral Oral  SpO2: 98% 100% 99% 99%  Weight: 80 kg 80 kg    Height:        General exam: Pleasant young male, moderately built and nourished lying comfortably propped up in bed without distress. Respiratory system: Clear to auscultation. Respiratory effort normal.  Right IJ tunneled HD catheter without acute findings. Cardiovascular system: S1 & S2 heard, RRR. No JVD, murmurs, rubs, gallops or clicks. No pedal edema.  Gastrointestinal system: Abdomen is nondistended, soft and nontender. No organomegaly or masses felt. Normal bowel sounds heard.  Right lower quadrant renal transplant surgical scar. Central nervous system: Alert and oriented. No focal neurological deficits. Extremities: Symmetric 5 x 5 power.  Left upper arm failed AV graft. Skin: No rashes, lesions or ulcers Psychiatry: Judgement and insight appear normal. Mood & affect appropriate.     Data Reviewed:   I have personally reviewed following labs and imaging studies   CBC: Recent Labs  Lab 03/09/19 1859 03/10/19 0335 03/11/19 0342  WBC 8.0  --  11.2*  HGB 9.3* 6.5* 6.7*  HCT 31.0* 20.7* 20.7*  MCV 93.4  --  89.2  PLT 242  --  [id number]    Basic Metabolic Panel: Recent Labs  Lab 03/09/19 1859 03/10/19 0335 03/11/19 0342  NA 142 141 137  K 4.6 5.7* 4.0  CL 106 107 102  CO2 25 24 26   GLUCOSE 128* 129* 97  BUN 28* 32* 24*  CREATININE 8.49* 8.85* 6.96*  CALCIUM 8.8* 8.1* 8.8*    Liver Function Tests: Recent Labs  Lab 03/09/19 1859  AST 15  ALT 14  ALKPHOS 78  BILITOT 0.4  PROT 6.8  ALBUMIN 3.4*    CBG: No results for input(s): GLUCAP in the last 168 hours.  Microbiology Studies:   Recent Results (from the past 240 hour(s))  SARS CORONAVIRUS 2 (TAT 6-24 HRS) Nasopharyngeal Nasopharyngeal Swab     Status: None   Collection Time: 03/09/19 11:34 PM   Specimen:  Nasopharyngeal Swab  Result Value Ref Range Status   SARS Coronavirus 2 NEGATIVE NEGATIVE Final    Comment: (NOTE) SARS-CoV-2 target nucleic acids are NOT DETECTED. The SARS-CoV-2 RNA is generally detectable in upper and lower respiratory specimens during the acute phase of infection. Negative results do not preclude SARS-CoV-2 infection, do not rule out co-infections with other pathogens, and should not be used as the sole basis for treatment or other patient management decisions. Negative results must be combined with clinical observations, patient history, and epidemiological information. The expected result is Negative. Fact Sheet for Patients: SugarRoll.be Fact Sheet for Healthcare Providers: https://www.woods-mathews.com/ This test is not yet approved or cleared by the [place] FDA and  has been authorized for detection and/or diagnosis of SARS-CoV-2 by FDA under an Emergency Use Authorization (EUA). This EUA will remain  in effect (meaning this test can be used) for the duration of the COVID-19 declaration under Section 56 4(b)(1) of the Act, 21 U.S.C. section 360bbb-3(b)(1), unless  the authorization is terminated or revoked sooner. Performed at [facility], [place] [address], [place],  [address]      Radiology Studies:   CT abdomen and pelvis with contrast on 03/09/2019:  IMPRESSION: 1. Thickened segment of transverse colon near the hepatic flexure with subtle pericolonic fat stranding and multiple mildly prominent diverticula, suggestive of early acute diverticulitis. 2. Circumferentially thickened, hyperemic appearance of the rectosigmoid colon suggesting a nonspecific colitis. 3. 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 4. Thickened appearance of the urinary bladder, which could reflect cystitis. 5. Aortic atherosclerosis. 6. Mild diffusely  sclerotic appearance of the osseous structures suggesting renal osteodystrophy.  Scheduled Meds:   . sodium chloride   Intravenous Once  . sodium chloride   Intravenous Once  . Chlorhexidine Gluconate Cloth  6 each Topical Q0600  . cinacalcet  30 mg Oral Q supper  . cloNIDine  0.1 mg Oral BID  . [START ON 03/12/2019] doxercalciferol  1 mcg Intravenous Q T,Th,Sa-HD  . sevelamer carbonate  2,400 mg Oral TID WC  . sodium chloride flush  3 mL Intravenous Q12H  . sodium chloride flush  3 mL Intravenous Q12H  . tacrolimus  5 mg Oral BID    Continuous Infusions:   . sodium chloride    . sodium chloride    . sodium chloride    . piperacillin-tazobactam (ZOSYN)  IV 2.25 g (03/11/19 0604)     LOS: 2 days     Vernell Leep, MD, [place], [facility]. [facility]    To contact the attending provider between 7A-7P or the covering provider during after hours 7P-7A, please log into the web site [email or web address] and access using universal [place] password for that web site. If you do not have the password, please call the hospital operator.  03/11/2019, 12:22 PM

## 2019-03-11 NOTE — Progress Notes (Cosign Needed)
Admit: 03/09/2019 LOS: 2   Subjective:  . Ryan [name] is feeling much better which he attributes to his blood transfusion yesterday. He notes continued small blood clots in the toilet, but otherwise no new complaints.  12/17 0701 - 12/18 0700 In: 520 [P.O.:120; I.V.:50; Blood:350] Out: 1500   Filed Weights   03/10/19 2305 03/11/19 0210 03/11/19 0531  Weight: 81.5 kg 80 kg 80 kg    Scheduled Meds: . sodium chloride   Intravenous Once  . sodium chloride   Intravenous Once  . Chlorhexidine Gluconate Cloth  6 each Topical Q0600  . cinacalcet  30 mg Oral Q supper  . cloNIDine  0.1 mg Oral BID  . [START ON 03/12/2019] doxercalciferol  1 mcg Intravenous Q T,Th,Sa-HD  . sevelamer carbonate  2,400 mg Oral TID WC  . sodium chloride flush  3 mL Intravenous Q12H  . sodium chloride flush  3 mL Intravenous Q12H  . tacrolimus  5 mg Oral BID   Continuous Infusions: . sodium chloride    . sodium chloride    . sodium chloride    . piperacillin-tazobactam (ZOSYN)  IV 2.25 g (03/11/19 0604)   PRN Meds:.sodium chloride, sodium chloride, sodium chloride, acetaminophen **OR** acetaminophen, alteplase, fentaNYL (SUBLIMAZE) injection, heparin, labetalol, lidocaine (PF), lidocaine-prilocaine, ondansetron **OR** ondansetron (ZOFRAN) IV, pentafluoroprop-tetrafluoroeth, sodium chloride flush  Current Labs:   Recent Labs  Lab 03/09/19 1859 03/10/19 0335 03/11/19 0342  NA 142 141 137  K 4.6 5.7* 4.0  CL 106 107 102  CO2 25 24 26   GLUCOSE 128* 129* 97  BUN 28* 32* 24*  CREATININE 8.49* 8.85* 6.96*  CALCIUM 8.8* 8.1* 8.8*   Recent Labs  Lab 03/09/19 1859 03/10/19 0335 03/11/19 0342  WBC 8.0  --  11.2*  HGB 9.3* 6.5* 6.7*  HCT 31.0* 20.7* 20.7*  MCV 93.4  --  89.2  PLT 242  --  151    Physical Exam:  Blood pressure 140/76, pulse 81, temperature 99 F (37.2 C), temperature source Oral, resp. rate 17, height 5\' 10"  (1.778 m), weight 80 kg, SpO2 99 %. Physical Exam  Constitutional: He  appears well-developed and well-nourished. No distress.  Cardiovascular: Normal rate and regular rhythm.  Pulmonary/Chest: Effort normal and breath sounds normal.  Abdominal: Soft. Bowel sounds are normal.  Skin: Skin is warm and dry.  HD Access: Left upper arm AVF with bruit  Assessment and Plan  ESRD. TTS schedule. HD late yesterday tolerated well. Continue to maintain regular dialysis schedule  Hyperkalemia. Secondary to ESRD. K 5.7 yesterday corrected with Lokelma.   Acute blood loss anemia, setting of anemia of chronic disease. CT 12/16 concerning for diverticulitis, mildly elevated WBC today. Zosyn started. Hgb 6.5 yesterday - given 1 unit of RBC yesterday. 6.7 this am, 2 units in progress today. Last Mircera Q4 wks given 11/19 per note - Aranesp with Saturday HD  Secondary Hyperparathyroidism. Stable. Continue Sensipar daily, Hectoral with HD  Hypertension. BP has become consistently mildly elevated over the past 24hrs. Home meds lasix, nifedipine and cozaar held. Clonidine 0.1mg  BID started yesterday  Failed Renal Transplant. Transplant 2009, continue Prograf    Ryan Luckman PA student 03/11/2019, 10:05 AM

## 2019-03-12 LAB — CBC
HCT: 21.7 % — ABNORMAL LOW (ref 39.0–52.0)
Hemoglobin: 7.1 g/dL — ABNORMAL LOW (ref 13.0–17.0)
MCH: 28.5 pg (ref 26.0–34.0)
MCHC: 32.7 g/dL (ref 30.0–36.0)
MCV: 87.1 fL (ref 80.0–100.0)
Platelets: 153 10*3/uL (ref 150–400)
RBC: 2.49 MIL/uL — ABNORMAL LOW (ref 4.22–5.81)
RDW: 15.4 % (ref 11.5–15.5)
WBC: 9.3 10*3/uL (ref 4.0–10.5)
nRBC: 0.2 % (ref 0.0–0.2)

## 2019-03-12 LAB — RENAL FUNCTION PANEL
Albumin: 3 g/dL — ABNORMAL LOW (ref 3.5–5.0)
Anion gap: 11 (ref 5–15)
BUN: 29 mg/dL — ABNORMAL HIGH (ref 6–20)
CO2: 26 mmol/L (ref 22–32)
Calcium: 8 mg/dL — ABNORMAL LOW (ref 8.9–10.3)
Chloride: 101 mmol/L (ref 98–111)
Creatinine, Ser: 10.22 mg/dL — ABNORMAL HIGH (ref 0.61–1.24)
GFR calc Af Amer: 6 mL/min — ABNORMAL LOW (ref >60)
GFR calc non Af Amer: 5 mL/min — ABNORMAL LOW (ref >60)
Glucose, Bld: 128 mg/dL — ABNORMAL HIGH (ref 70–99)
Phosphorus: 3 mg/dL (ref 2.5–4.6)
Potassium: 3.9 mmol/L (ref 3.5–5.1)
Sodium: 138 mmol/L (ref 135–145)

## 2019-03-12 MED ORDER — HEPARIN SODIUM (PORCINE) 1000 UNIT/ML IJ SOLN
INTRAMUSCULAR | Status: AC
  Administered 2019-03-12: 3800 [IU] via INTRAVENOUS_CENTRAL
  Filled 2019-03-12: qty 4

## 2019-03-12 MED ORDER — AMOXICILLIN-POT CLAVULANATE 500-125 MG PO TABS: 1.0000 | ORAL_TABLET | Freq: Two times a day (BID) | ORAL | 0 refills | Status: AC

## 2019-03-12 MED ORDER — PIPERACILLIN-TAZOBACTAM 3.375 G IVPB
3.3750 g | Freq: Two times a day (BID) | INTRAVENOUS | Status: DC
  Administered 2019-03-12: 3.375 g via INTRAVENOUS
  Filled 2019-03-12 (×2): qty 50

## 2019-03-12 MED ORDER — DARBEPOETIN ALFA 60 MCG/0.3ML IJ SOSY
PREFILLED_SYRINGE | INTRAMUSCULAR | Status: AC
  Filled 2019-03-12: qty 0.3

## 2019-03-12 NOTE — Progress Notes (Signed)
Per MD, pt to be discharged after HD and after receiving 3.375g zosyn Pt aware of plan of care and understanding  Pt transported to HD

## 2019-03-12 NOTE — Progress Notes (Addendum)
[facility] Progress Note   Subjective:   Patient seen and examined at bedside.  Reports feeling well.  No bleeding overnight.  Had a mostly formed BM this AM with no blood. Denies CP, SOB, edema, n/v/d, abdominal pain.    Objective Vitals:   03/12/19 0026 03/12/19 0500 03/12/19 0541 03/12/19 0911  BP: (!) 160/93  (!) 158/88 (!) 148/85  Pulse: 79  74 79  Resp: 16  16 16   Temp: 98.1 F (36.7 C)  98 F (36.7 C)   TempSrc: Oral  Oral   SpO2: 98%  100%   Weight:  81.1 kg    Height:       Physical Exam General:NAD, well appearing male, laying in bed Heart:RRR Lungs:CTAB Abdomen:soft, NTND Extremities:no LE edema Dialysis Access: [facility]   Filed Weights   03/11/19 0210 03/11/19 0531 03/12/19 0500  Weight: 80 kg 80 kg 81.1 kg    Intake/Output Summary (Last 24 hours) at 03/12/2019 1157 Last data filed at 03/12/2019 0900 Gross per 24 hour  Intake 886.6 ml  Output --  Net 886.6 ml    Additional Objective Labs: Basic Metabolic Panel: Recent Labs  Lab 03/09/19 1859 03/10/19 0335 03/11/19 0342  NA 142 141 137  K 4.6 5.7* 4.0  CL 106 107 102  CO2 25 24 26   GLUCOSE 128* 129* 97  BUN 28* 32* 24*  CREATININE 8.49* 8.85* 6.96*  CALCIUM 8.8* 8.1* 8.8*   Liver Function Tests: Recent Labs  Lab 03/09/19 1859  AST 15  ALT 14  ALKPHOS 78  BILITOT 0.4  PROT 6.8  ALBUMIN 3.4*   CBC: Recent Labs  Lab 03/09/19 1859 03/11/19 0342 03/11/19 1843 03/12/19 0138  WBC 8.0 11.2*  --  9.3  HGB 9.3* 6.7* 7.5* 7.1*  HCT 31.0* 20.7* 22.5* 21.7*  MCV 93.4 89.2  --  87.1  PLT 242 151  --  153   Blood Culture    Component Value Date/Time   SDES FLUID RIGHT PLEURAL 10/19/2017 1059   SPECREQUEST NO BACTERIA CULTURE ORDERED,PER DR Eliseo Squires 10/19/2017 1059   CULT NO GROWTH 10/07/2016 0907   REPTSTATUS 10/19/2017 FINAL 10/19/2017 1059    Lab Results  Component Value Date   INR 1.13 09/23/2017   INR 1.01 08/28/2016   Studies/Results: No results  found.  Medications: . sodium chloride    . sodium chloride    . sodium chloride    . piperacillin-tazobactam Stopped (03/12/19 1045)   . Chlorhexidine Gluconate Cloth  6 each Topical Q0600  . Chlorhexidine Gluconate Cloth  6 each Topical Q0600  . cinacalcet  30 mg Oral Q supper  . cloNIDine  0.1 mg Oral BID  . darbepoetin (ARANESP) injection - DIALYSIS  60 mcg Intravenous Q Sat-HD  . doxercalciferol  1 mcg Intravenous Q T,Th,Sa-HD  . sevelamer carbonate  2,400 mg Oral TID WC  . sodium chloride flush  3 mL Intravenous Q12H  . sodium chloride flush  3 mL Intravenous Q12H  . tacrolimus  5 mg Oral BID    Dialysis Orders: TTS - [facility] 4hrs, BFR 450, DFR 800, EDW 82.5kg, 2K/ 3.5Ca  Access: TDC  Heparin None Mircera 50 mcg q4wks - last 11/19 Hectorol 10mcg IV qHD   Last Labs: 12/10 Hgb 10.6, TSAT 40, K 5.2, Ca 8.2, P 3.4, PTH 295, Alb 4.1  Assessment/Plan: 1. Painless hematochezia - no episodes in the last 24hrs. CT abd showed likely early acute diverticulitis. On Zosyn, transition to PO ABX at d/c.  GI consulted, rec OP colonoscopy in about 6-8 weeks.  2. Hyperkalemia - improved. K 4.0 today. 3. ESRD - HD TTS. HD today per regular schedule.  Treatment shortened to 3.5hrs due to high census.  4. Hypertension/volume - Blood pressure well controlled. On clonidine and labetalol, does not take labetalol at home, change to home meds: nifedipine, losartan and clonidine. Does not appear grossly volume overloaded.  Net UF 1.5L removed.Post wt 80kg, will need new EDW at d/c.  5. ABLA on Chronic anemia of CKD - Hgb drop from 9.3>6.5>6.7.  2 unit pRBC transfused yesterday, plan for additional 1 unit today.   Aranesp ordered with HD today, 2mcg.   6. Secondary Hyperparathyroidism - Ca in goal. Phos in goal as OP. Continue VDRA, sensipar and binders.  7. Nutrition - Renal diet w/fluid restrictions once advanced.  8. 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 Dispo - ok to d/c  from renal standpoint  Jen Mow, PA-C [facility] Pager: [phone] 03/12/2019,11:57 AM  LOS: 3 days   I have seen and examined this patient and agree with plan and assessment in the above note with renal recommendations/intervention highlighted.  Doing well and plan for discharge to home after HD. [place] John A Alexsia Klindt,MD 03/12/2019 2:17 PM

## 2019-03-12 NOTE — Discharge Instructions (Signed)

## 2019-03-12 NOTE — Progress Notes (Signed)
Subjective: Patient has been able to tolerate renal diet. Reports that he has not had any further episodes of rectal bleeding at least for the last 24 hours. Denies abdominal pain.  Objective: Vital signs in last 24 hours: Temp:  [97.7 F (36.5 C)-98.7 F (37.1 C)] 98 F (36.7 C) (12/19 0541) Pulse Rate:  [74-87] 79 (12/19 0911) Resp:  [16-18] 16 (12/19 0911) BP: (129-164)/(74-93) 148/85 (12/19 0911) SpO2:  [98 %-100 %] 100 % (12/19 0541) Weight:  [81.1 kg] 81.1 kg (12/19 0500) Weight change: -0.352 kg Last BM Date: 03/11/19  PE: Mild pallor, no icterus GENERAL: Left arm AV fistula noted ABDOMEN: Soft, nontender, nondistended, normal active bowel sounds EXTREMITIES: No pedal edema  Lab Results: Results for orders placed or performed during the hospital encounter of 03/09/19 (from the past 48 hour(s))  CBC     Status: Abnormal   Collection Time: 03/11/19  3:42 AM  Result Value Ref Range   WBC 11.2 (H) 4.0 - 10.5 K/uL   RBC 2.32 (L) 4.22 - 5.81 MIL/uL   Hemoglobin 6.7 (LL) 13.0 - 17.0 g/dL    Comment: REPEATED TO VERIFY CRITICAL VALUE NOTED.  VALUE IS CONSISTENT WITH PREVIOUSLY REPORTED AND CALLED VALUE.    HCT 20.7 (L) 39.0 - 52.0 %   MCV 89.2 80.0 - 100.0 fL   MCH 28.9 26.0 - 34.0 pg   MCHC 32.4 30.0 - 36.0 g/dL   RDW 14.7 11.5 - 15.5 %   Platelets 151 150 - 400 K/uL   nRBC 0.0 0.0 - 0.2 %    Comment: Performed at [facility], [place] [address] [place], [place] [id number]  Basic metabolic panel     Status: Abnormal   Collection Time: 03/11/19  3:42 AM  Result Value Ref Range   Sodium 137 135 - 145 mmol/L   Potassium 4.0 3.5 - 5.1 mmol/L   Chloride 102 98 - 111 mmol/L   CO2 26 22 - 32 mmol/L   Glucose, Bld 97 70 - 99 mg/dL   BUN 24 (H) 6 - 20 mg/dL   Creatinine, Ser 6.96 (H) 0.61 - 1.24 mg/dL   Calcium 8.8 (L) 8.9 - 10.3 mg/dL   GFR calc non Af Amer 8 (L) >60 mL/min   GFR calc Af Amer 9 (L) >60 mL/min   Anion gap 9 5 - 15    Comment: Performed at [facility] [address], [place], [place] [address]  Prepare RBC     Status: None   Collection Time: 03/11/19  7:30 AM  Result Value Ref Range   Order Confirmation      ORDER PROCESSED BY BLOOD BANK Performed at [facility], [place] [address], [place], [place] [address]   Prepare RBC     Status: None   Collection Time: 03/11/19  9:49 AM  Result Value Ref Range   Order Confirmation      ORDER PROCESSED BY BLOOD BANK BB SAMPLE OR UNITS ALREADY AVAILABLE Performed at [facility], [place] [address], [place], [place] [address]   Hemoglobin and hematocrit, blood     Status: Abnormal   Collection Time: 03/11/19  6:43 PM  Result Value Ref Range   Hemoglobin 7.5 (L) 13.0 - 17.0 g/dL   HCT 22.5 (L) 39.0 - 52.0 %    Comment: Performed at [facility] [address] [place],  [address]  CBC     Status: Abnormal   Collection Time:  03/12/19  1:38 AM  Result Value Ref Range   WBC 9.3 4.0 - 10.5 K/uL   RBC 2.49 (L) 4.22 - 5.81 MIL/uL   Hemoglobin 7.1 (L) 13.0 - 17.0 g/dL   HCT 21.7 (L) 39.0 - 52.0 %   MCV 87.1 80.0 - 100.0 fL   MCH 28.5 26.0 - 34.0 pg   MCHC 32.7 30.0 - 36.0 g/dL   RDW 15.4 11.5 - 15.5 %   Platelets 153 150 - 400 K/uL   nRBC 0.2 0.0 - 0.2 %    Comment: Performed at [facility], [place] [address] [place], [place] [address]    Studies/Results: No results found.  Medications: I have reviewed the patient's current medications.  Assessment: Multiple episodes of hematochezia suspected to be diverticular in origin Patient required 2 units PRBC transfusion, hemoglobin stable at 7.5/7.1 No hemodynamic instability  End-stage renal disease on dialysis  Plan: Okay to DC home from GI standpoint. Recommend Augmentin or combination of Cipro and Flagyl for 7 days on discharge as he was noted to have possible diverticulitis. We will arrange for follow-up with Dr. Cristina Gong as an outpatient, for elective colonoscopy likely in the next 6 to 8  weeks. Meanwhile, advised patient to take high-fiber diet, increase intake of fruits, vegetables, whole grains and to supplement with bulk forming agent such as Metamucil/Benefiber/psyllium husk. Patient verbalized understanding.  Ronnette Juniper, MD 03/12/2019, 10:07 AM

## 2019-03-12 NOTE — Discharge Summary (Signed)
Physician Discharge Summary  Ryan [name] [id number] DOB: [birth year]  PCP: Orma Flaming, MD  Admitted from: Home Discharged to: Home  Admit date: 03/09/2019 Discharge date: 03/12/2019  Recommendations for Outpatient Follow-up:   Follow-up Information    Orma Flaming, MD. Schedule an appointment as soon as possible for a visit in 1 week(s).   Specialty: Family Medicine Why: To be seen with repeat labs (CBC & Renal panel).  If labs are drawn across HD then can just review them rather than repeating labs. Contact information: [place] [address] [phone]        Leonie Man, MD .   Specialty: Cardiology Contact information: [address] [place] [address] [phone]        Ronald Lobo, MD Follow up.   Specialty: Gastroenterology Why: Office will arrange for follow-up as an outpatient, for elective colonoscopy likely in the next 6 to 8 weeks. Contact information: [address] [place] [address] [phone]        [facility] Follow up on 03/15/2019.   Why: Keep regular hemodialysis appointments on Tuesdays, Thursdays and Saturdays.           Home Health: None Equipment/Devices: None  Discharge Condition: Improved and stable CODE STATUS: Full Diet recommendation: Heart healthy diet.  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  In the ED, initial hemoglobin was 9.3 which dropped down to a low of 6.5.  CT abdomen showed diverticulosis  with thickening suggesting possible diverticulitis in the region of the hepatic flexure and some rectosigmoid mucosal thickening of uncertain clinical significance.  He was admitted for suspected acute diverticular bleed, possible acute diverticulitis and acute blood loss anemia.  Transfused 2 units PRBCs.  GI bleeding has resolved.  [facility] consulted.   Assessment & Plan:   Acute lower GI bleed, suspected diverticular bleed.  History as noted above.    Patient actually not on aspirin PTA.  Patient unsure but feels that he may have had colonoscopy at [facility].  As discussed with [facility] GI team, patient's undergoing transplant usually have screening colonoscopy.  However unable to find report of colonoscopy in [facility] or in care everywhere.  [facility] was consulted and their input appreciated.  Diet was advanced which he has tolerated.  He has not had any rectal bleed for greater than 48 hours.  Hemoglobin has been stable posttransfusion.  I discussed with [facility] this morning, cleared for discharge home on oral antibiotics for 7 days and they will arrange outpatient follow-up for colonoscopy in 6 to 8 weeks.  Suspected acute diverticulitis  Treated empirically with IV Zosyn in the hospital and will be discharged on oral Augmentin to complete total 7 days course per GI.  Asymptomatic.  Acute blood loss anemia complicating anemia of CKD  Secondary to GI bleed.  Baseline hemoglobin probably in the 10 g range.  Initial hemoglobin was 9.3 but quickly dropped to a low of 6.5.    Patient was transfused a total  of 2 units of PRBCs this admission.  Hemoglobin last evening was 7.5 and slightly low at 7.1 this morning.  Asymptomatic of same.  Patient to receive Aranesp across HD.  Close follow-up of CBC as outpatient during next dialysis 12/22 and may consider transfusion if hemoglobin 7 g or less.  ESRD on TTS HD  Via RIJ HD catheter due to failed LUE AVG  Had HD on 12/17.  Is  supposed to have his scheduled dialysis on second shift today prior to discharge home.  Mild hyperkalemia resolved.  Metabolically and volume wise stable.  Patient on Lokelma three times a week at home.  Suspect propensity for hyperkalemia.  Also on ARB?  Consider stopping due to hyperkalemia.  Will defer this decision to his outpatient nephrologist.  S/p failed renal transplant  Continue tacrolimus.  Essential hypertension  Suspect difficult to control given polypharmacy medications that he is on at home.  Here he was only on clonidine and blood pressures were mildly controlled.  At discharge resume all prior antihypertensives and Lasix.   Consultants:   Nephrology [facility]  Procedures:   HD 12/17 and supposed to have dialysis on second shift today prior to discharge.   Discharge Instructions  Discharge Instructions    Call MD for:   Complete by: As directed    Recurrent rectal bleeding.   Call MD for:  difficulty breathing, headache or visual disturbances   Complete by: As directed    Call MD for:  extreme fatigue   Complete by: As directed    Call MD for:  persistant dizziness or light-headedness   Complete by: As directed    Call MD for:  persistant nausea and vomiting   Complete by: As directed    Call MD for:  severe uncontrolled pain   Complete by: As directed    Call MD for:  temperature >100.4   Complete by: As directed    Diet - low sodium heart healthy   Complete by: As directed    Increase activity slowly   Complete by: As directed        Medication List    TAKE these medications   amoxicillin-clavulanate 500-125 MG tablet Commonly known as: Augmentin Take 1 tablet (500 mg total) by mouth 2 (two) times daily for 7 days.   cinacalcet 30 MG tablet Commonly known as: SENSIPAR Take 30 mg by mouth daily.   cloNIDine 0.1 MG tablet Commonly known as: CATAPRES Take 1 tablet (0.1 mg total) by mouth 2 (two) times daily.   furosemide 80 MG  tablet Commonly known as: LASIX TAKE 1 TABLET (80 MG TOTAL) BY MOUTH 2 (TWO) TIMES DAILY.   labetalol 300 MG tablet Commonly known as: NORMODYNE TAKE 1 TABLET (300 MG TOTAL) BY MOUTH 2 (TWO) TIMES DAILY. OFFICE VISIT NEEDED What changed: additional instructions   Lokelma 10 g Pack packet Generic drug: sodium zirconium cyclosilicate Take 1 packet by mouth 3 (three) times a week. Monday, Wednesday, and Friday   loratadine 10 MG tablet Commonly known as: CLARITIN Take 10 mg by mouth daily as needed for allergies.   losartan 50 MG tablet Commonly known as: COZAAR Take 1 tablet (50 mg total) by mouth daily.   multivitamin Tabs tablet Take 1 tablet by mouth daily.   NIFEdipine 60 MG 24 hr tablet Commonly known as: PROCARDIA XL/NIFEDICAL XL Take 60 mg by mouth 2 (two) times daily.   omeprazole 20 MG capsule Commonly known as: PRILOSEC TAKE 1 CAPSULE BY MOUTH  EVERY DAY What changed: how much to take   pravastatin 80 MG tablet Commonly known as: PRAVACHOL Take 1 tablet (80 mg total) by mouth at bedtime.   sevelamer carbonate 800 MG tablet Commonly known as: RENVELA Take 1 tablet (800 mg total) by mouth 3 (three) times daily with meals.   tacrolimus 5 MG capsule Commonly known as: PROGRAF Take 5 mg by mouth 2 (two) times daily.      Allergies  Allergen Reactions  . Azithromycin Nausea And Vomiting and Other (See Comments)    Chest tightness      Procedures/Studies: CT ABDOMEN PELVIS W CONTRAST  Result Date: 03/09/2019 CLINICAL DATA:  Generalized abdominal pain EXAM: CT ABDOMEN AND PELVIS WITH CONTRAST TECHNIQUE: Multidetector CT imaging of the abdomen and pelvis was performed using the standard protocol following bolus administration of intravenous contrast. CONTRAST:  147mL OMNIPAQUE IOHEXOL 300 MG/ML  SOLN COMPARISON:  CT 07/07/2017 FINDINGS: Lower chest: No acute abnormality. Hepatobiliary: No focal liver abnormality is seen. No gallstones, gallbladder wall  thickening, or biliary dilatation. Pancreas: Unremarkable. No pancreatic ductal dilatation or surrounding inflammatory changes. Spleen: Normal in size without focal abnormality. Adrenals/Urinary Tract: Bilateral adrenal glands are unremarkable. Chronic atrophy of the bilateral native kidneys without hydronephrosis. Simple cyst at the superior pole of the native right kidney. Ureters are unremarkable. The transplant kidney within the right lower quadrant has decreased in size compared to prior study. Heterogeneous appearance of the transplant kidney with cortical calcifications. There is a nephroureteral stent in place. Urinary bladder is circumferentially thickened and irregular in appearance with adjacent fat stranding. Stomach/Bowel: Stomach is within normal limits. Appendix not clearly visualized and may be surgically absent. Scattered colonic diverticulosis. Mildly thickened, hyperemic appearance of the rectosigmoid colon, which may reflect a nonspecific colitis. Additionally, there is a thickened segment of transverse colon near the hepatic flexure with subtle pericolonic fat stranding and multiple mildly prominent diverticula (series 5, images 80-83). No dilated loops of bowel. Vascular/Lymphatic: Aortic atherosclerosis. No enlarged abdominal or pelvic lymph nodes. Reproductive: Prostate is unremarkable. Other: No abdominal wall hernia or abnormality. No abdominopelvic ascites. Musculoskeletal: Mild diffusely sclerotic appearance of the osseous structures suggesting renal osteodystrophy. Lucent changes within the L2 vertebral body are less prominent compared to prior. Scattered Schmorl's nodes. IMPRESSION: 1. Thickened segment of transverse colon near the hepatic flexure with subtle pericolonic fat stranding and multiple mildly prominent diverticula, suggestive of early acute diverticulitis. 2. Circumferentially thickened, hyperemic appearance of the rectosigmoid colon suggesting a nonspecific colitis. 3.  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 4. Thickened appearance of the urinary bladder, which could reflect cystitis. 5. Aortic atherosclerosis. 6. Mild diffusely sclerotic appearance of the osseous structures suggesting renal osteodystrophy. Aortic Atherosclerosis (ICD10-I70.0). Electronically Signed   By: Davina Poke M.D.   On: 03/09/2019 20:29      Subjective: Patient denies complaints.  Has had 3-4 loose BMs in the last 24 hours without blood.  No abdominal pain.  Tolerating diet without nausea or vomiting.  Denies dizziness, lightheadedness, chest pain or dyspnea.  As per RN, no acute issues noted.  Discharge Exam:  Vitals:   03/12/19 0026 03/12/19 0500 03/12/19 0541 03/12/19 0911  BP: (!) 160/93  (!) 158/88 (!) 148/85  Pulse: 79  74 79  Resp: 16  16 16   Temp: 98.1 F (36.7 C)  98 F (36.7 C)   TempSrc: Oral  Oral   SpO2: 98%  100%   Weight:  81.1 kg  Height:        General exam: Pleasant young male, moderately built and nourished lying comfortably propped up in bed without distress. Respiratory system: Clear to auscultation. Respiratory effort normal.  Right IJ tunneled HD catheter without acute findings. Cardiovascular system: S1 & S2 heard, RRR. No JVD, murmurs, rubs, gallops or clicks. No pedal edema.  Gastrointestinal system: Abdomen is nondistended, soft and nontender. No organomegaly or masses felt. Normal bowel sounds heard.  Right lower quadrant renal transplant surgical scar. Central nervous system: Alert and oriented. No focal neurological deficits. Extremities: Symmetric 5 x 5 power.  Left upper arm failed AV graft. Skin: No rashes, lesions or ulcers Psychiatry: Judgement and insight appear normal. Mood & affect appropriate.     The results of significant diagnostics from this hospitalization (including imaging, microbiology, ancillary and laboratory) are listed below for reference.      Microbiology: Recent Results (from the past 240 hour(s))  SARS CORONAVIRUS 2 (TAT 6-24 HRS) Nasopharyngeal Nasopharyngeal Swab     Status: None   Collection Time: 03/09/19 11:34 PM   Specimen: Nasopharyngeal Swab  Result Value Ref Range Status   SARS Coronavirus 2 NEGATIVE NEGATIVE Final    Comment: (NOTE) SARS-CoV-2 target nucleic acids are NOT DETECTED. The SARS-CoV-2 RNA is generally detectable in upper and lower respiratory specimens during the acute phase of infection. Negative results do not preclude SARS-CoV-2 infection, do not rule out co-infections with other pathogens, and should not be used as the sole basis for treatment or other patient management decisions. Negative results must be combined with clinical observations, patient history, and epidemiological information. The expected result is Negative. Fact Sheet for Patients: SugarRoll.be Fact Sheet for Healthcare Providers: https://www.woods-mathews.com/ This test is not yet approved or cleared by the [place] FDA and  has been authorized for detection and/or diagnosis of SARS-CoV-2 by FDA under an Emergency Use Authorization (EUA). This EUA will remain  in effect (meaning this test can be used) for the duration of the COVID-19 declaration under Section 56 4(b)(1) of the Act, 21 U.S.C. section 360bbb-3(b)(1), unless the authorization is terminated or revoked sooner. Performed at [facility], [place] [address] [place], [place] [address]      Labs: CBC: Recent Labs  Lab 03/09/19 1859 03/10/19 0335 03/11/19 0342 03/11/19 1843 03/12/19 0138  WBC 8.0  --  11.2*  --  9.3  HGB 9.3* 6.5* 6.7* 7.5* 7.1*  HCT 31.0* 20.7* 20.7* 22.5* 21.7*  MCV 93.4  --  89.2  --  87.1  PLT 242  --  151  --  0000000    Basic Metabolic Panel: Recent Labs  Lab 03/09/19 1859 03/10/19 0335 03/11/19 0342  NA 142 141 137  K 4.6 5.7* 4.0  CL 106 107 102  CO2 25 24 26   GLUCOSE  128* 129* 97  BUN 28* 32* 24*  CREATININE 8.49* 8.85* 6.96*  CALCIUM 8.8* 8.1* 8.8*    Liver Function Tests: Recent Labs  Lab 03/09/19 1859  AST 15  ALT 14  ALKPHOS 78  BILITOT 0.4  PROT 6.8  ALBUMIN 3.4*     Time coordinating discharge: 25 minutes  SIGNED:  Vernell Leep, MD, [place], [facility]. [facility]  To contact the attending provider between 7A-7P or the covering provider during after hours 7P-7A, please log into the web site [email or web address] and access using universal [place] password for that web site. If you do not have the password, please call the hospital operator.

## 2019-03-12 NOTE — Progress Notes (Signed)
Pt discharge education provided at bedside  Pt has all belongings  Pt IV removed, catheter intact  Pt ambulated off unit with RN

## 2019-03-12 NOTE — Progress Notes (Signed)
Pharmacy Antibiotic Note  DEKON TANN is a 58 y.o. male admitted on 03/09/2019 with concern for intra-abdominal infection (diverticulitis). Pharmacy has been consulted for Zosyn dosing. ESRD on HD TTSa - last HD 12/17. HD scheduled for short session today and possible discharge per IM note.  D#4 abx. WBC WNL. Patient is afebrile. No cultures, on empiric therapy for diverticulitis.  Plan: - Switch Zosyn to 3.375 GM IV q12h extended infusion for HD dosing - Consider deescalation to Augmentin on discharge for total of 5-7 days - F/u HD schedule/tolerance inpatient   Height: 5\' 10"  (177.8 cm) Weight: 178 lb 14.4 oz (81.1 kg) IBW/kg (Calculated) : 73  Temp (24hrs), Avg:98.3 F (36.8 C), Min:97.7 F (36.5 C), Max:99 F (37.2 C)  Recent Labs  Lab 03/09/19 1859 03/10/19 0335 03/11/19 0342 03/12/19 0138  WBC 8.0  --  11.2* 9.3  CREATININE 8.49* 8.85* 6.96*  --     Estimated Creatinine Clearance: 12.1 mL/min (A) (by C-G formula based on SCr of 6.96 mg/dL (H)).    Allergies  Allergen Reactions  . Azithromycin Nausea And Vomiting and Other (See Comments)    Chest tightness    Agnes Lawrence, PharmD PGY1 Pharmacy Resident

## 2019-03-13 ENCOUNTER — Telehealth: Payer: Self-pay | Admitting: Nephrology

## 2019-03-13 LAB — TYPE AND SCREEN
ABO/RH(D): O POS
Antibody Screen: POSITIVE
Unit division: 0
Unit division: 0
Unit division: 0
Unit division: 0
Unit division: 0
Unit division: 0

## 2019-03-13 LAB — BPAM RBC
Blood Product Expiration Date: 202101112359
Blood Product Expiration Date: 202101112359
Blood Product Expiration Date: 202101142359
Blood Product Expiration Date: 202101152359
Blood Product Expiration Date: 202101152359
Blood Product Expiration Date: 202101202359
ISSUE DATE / TIME: 202012170725
ISSUE DATE / TIME: 202012171031
ISSUE DATE / TIME: 202012181000
ISSUE DATE / TIME: 202012181342
Unit Type and Rh: 5100
Unit Type and Rh: 5100
Unit Type and Rh: 5100
Unit Type and Rh: 5100
Unit Type and Rh: 5100
Unit Type and Rh: 5100

## 2019-03-13 NOTE — Telephone Encounter (Signed)
Transition of Care Contact from [place]  Date of Discharge: 03/12/19 Date of Contact: 03/13/19 Method of contact: phone  Attempted to contact patient to discuss transition of care from inpatient admission due to Sepsis. Patient did not answer the phone.  Unable to leave message on patient voicemail because it was full.  Will follow up again by phone and/or at dialysis this week.   Jen Mow, PA-C [facility] Pager: [phone]

## 2019-05-12 ENCOUNTER — Telehealth: Payer: Self-pay | Admitting: Family Medicine

## 2019-05-12 NOTE — Telephone Encounter (Signed)
I left a message asking the pt to call and schedule AWV w/ Loma Sousa and OV w/ Dr. Rogers Blocker.

## 2019-08-20 IMAGING — DX DG CHEST 1V
1 series · 1 of 1 positions shown · non-contrast
Comparison: Chest x-ray of December 19, 2017

CLINICAL DATA: Status post right-sided thoracentesis today.

EXAM:
CHEST  1 VIEW

[x chest ap]
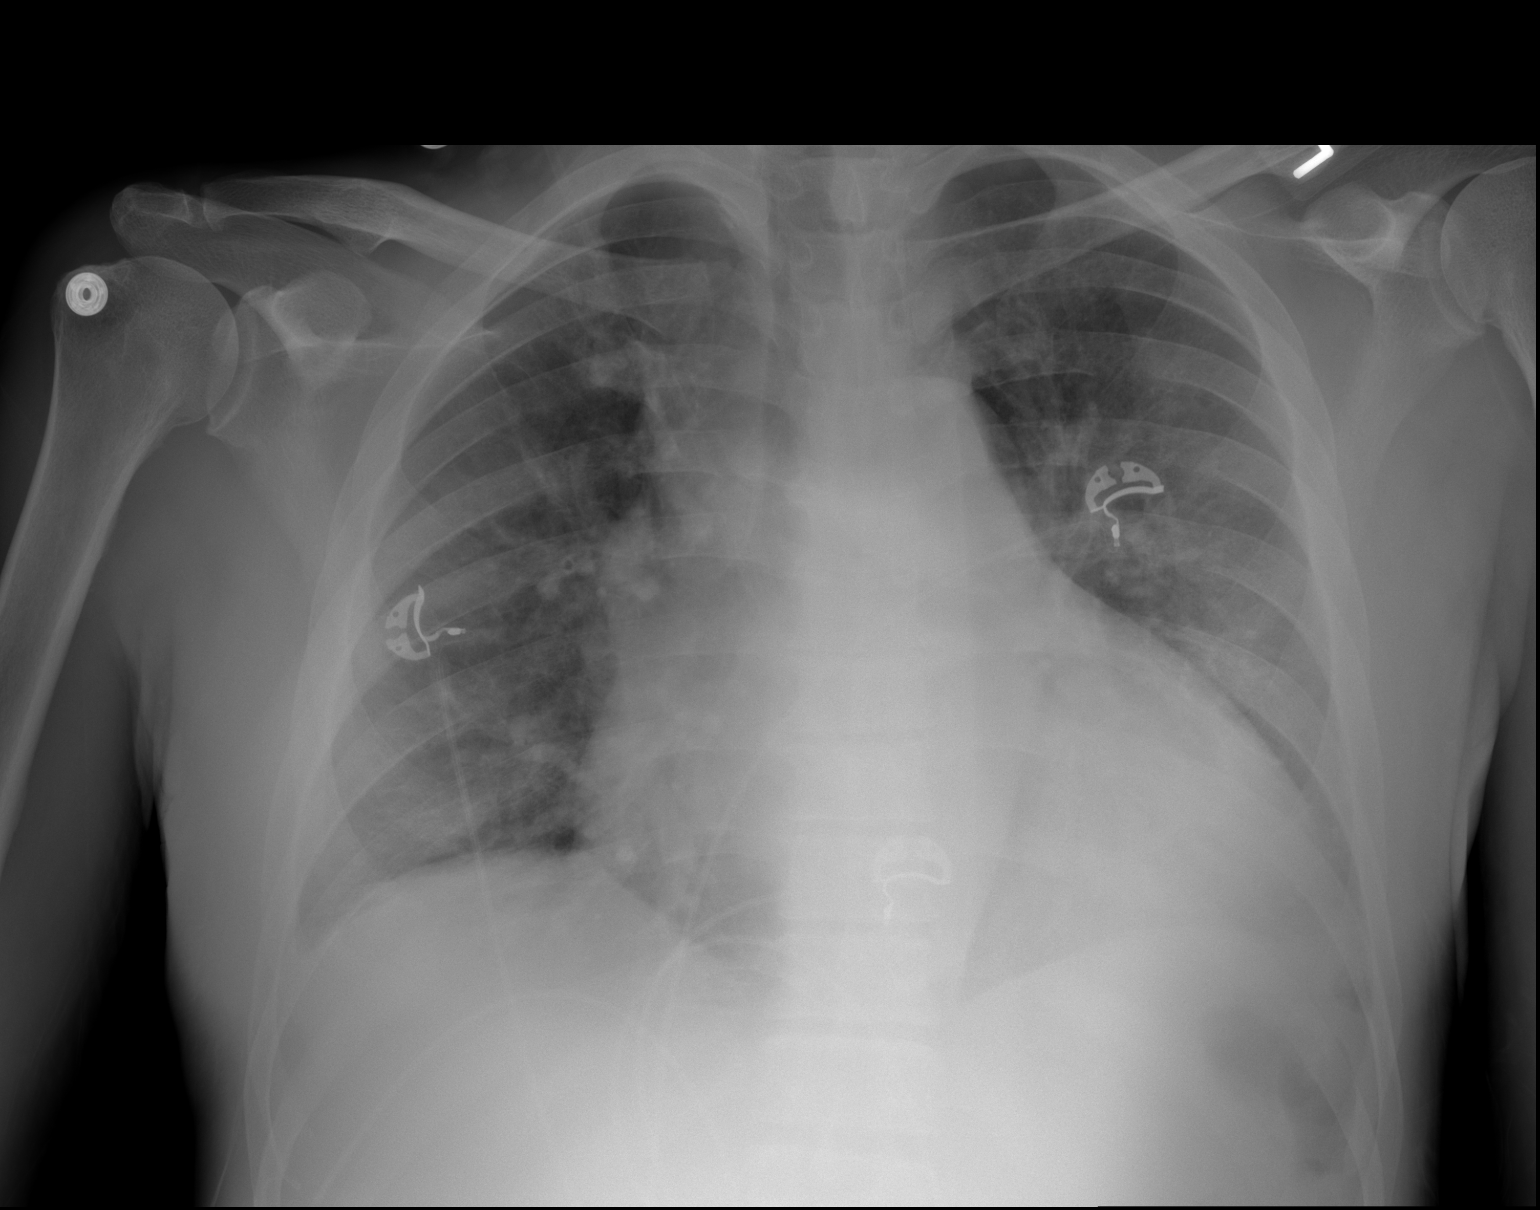

[1 of 1 positions shown; findings below may reference images not displayed]

FINDINGS: The right lung remains well expanded. No postprocedure pneumothorax
is observed. The volume of pleural fluid on the right has decreased
significantly. The cardiac silhouette is enlarged. There is mild
central pulmonary vascular engorgement. The trachea is midline. The
bony thorax exhibits no acute abnormality.
IMPRESSION: No postprocedure complication following right-sided thoracentesis.
Only a small amount of pleural fluid remains.

## 2019-08-31 ENCOUNTER — Other Ambulatory Visit: Payer: Self-pay

## 2019-08-31 ENCOUNTER — Ambulatory Visit (INDEPENDENT_AMBULATORY_CARE_PROVIDER_SITE_OTHER): Payer: Medicare HMO | Admitting: Physician Assistant

## 2019-08-31 VITALS — BP 125/77 | HR 79 | Temp 97.6°F | Resp 18 | Ht 70.0 in | Wt 176.2 lb

## 2019-08-31 DIAGNOSIS — Z992 Dependence on renal dialysis: Secondary | ICD-10-CM | POA: Diagnosis not present

## 2019-08-31 DIAGNOSIS — I721 Aneurysm of artery of upper extremity: Secondary | ICD-10-CM | POA: Diagnosis not present

## 2019-08-31 DIAGNOSIS — N186 End stage renal disease: Secondary | ICD-10-CM

## 2019-08-31 NOTE — Progress Notes (Signed)
Established Dialysis Access   History of Present Illness   Ryan [name] is a 59 y.o. (12-28-1960) male who presents for follow up of left brachiocephalic fistula. The patient is right hand dominant.  Previous access procedures have been completed in the left arm with most recent being a an attempted plication of the left AV fistula in September of 2019 by Dr. Oneida Alar. The Aneurysm unfortunately was unreconstructable with degeneration. Angioplasty of the fistula x 3 was performed but ultimately the fistula was ligated and a right IJ palindrome catheter was placed. He has been using his right IJ catheter for dialysis since on Tues/Thurs/ Sat at the [facility] location.  At time of his last visit in October of 2019 with Dr. Oneida Alar they discussed conversion to Left AV graft versus a right radiocephalic fistula. However the patient was reluctant to proceed with any surgery to his right arm and was advised to wait at least 4 weeks for the left arm to heal following his plication prior to placing a AV graft.  He presents today for evaluation of aneurysmal area of the previous left AV fistula. He feels that the distal area at the antecubital anastomosis site is somewhat larger now. He denies any pain in the area. He has difficulty with getting shirts on over it. He really just would like the area removed. He denies any steal symptoms in the left upper arm.  The patient's PMH, PSH, SH, and FamHx were reviewed and are unchanged from October 2019  Current Outpatient Medications  Medication Sig Dispense Refill  . cinacalcet (SENSIPAR) 30 MG tablet Take 30 mg by mouth daily.  11  . cloNIDine (CATAPRES) 0.1 MG tablet Take 1 tablet (0.1 mg total) by mouth 2 (two) times daily. 60 tablet 11  . furosemide (LASIX) 80 MG tablet TAKE 1 TABLET (80 MG TOTAL) BY MOUTH 2 (TWO) TIMES DAILY. 180 tablet 1  . labetalol (NORMODYNE) 300 MG tablet TAKE 1 TABLET (300 MG TOTAL) BY MOUTH 2 (TWO) TIMES DAILY. OFFICE VISIT  NEEDED (Patient taking differently: Take 300 mg by mouth 2 (two) times daily. ) 60 tablet 0  . LOKELMA 10 g PACK packet Take 1 packet by mouth 3 (three) times a week. Monday, Wednesday, and Friday    . loratadine (CLARITIN) 10 MG tablet Take 10 mg by mouth daily as needed for allergies.    Marland Kitchen losartan (COZAAR) 50 MG tablet Take 1 tablet (50 mg total) by mouth daily. 90 tablet 1  . multivitamin (RENA-VIT) TABS tablet Take 1 tablet by mouth daily.    Marland Kitchen NIFEdipine (PROCARDIA XL/ADALAT-CC) 60 MG 24 hr tablet Take 60 mg by mouth 2 (two) times daily.    Marland Kitchen omeprazole (PRILOSEC) 20 MG capsule TAKE 1 CAPSULE BY MOUTH EVERY DAY (Patient taking differently: Take 20 mg by mouth daily. ) 60 capsule 0  . pravastatin (PRAVACHOL) 80 MG tablet Take 1 tablet (80 mg total) by mouth at bedtime. 90 tablet 3  . sevelamer carbonate (RENVELA) 800 MG tablet Take 1 tablet (800 mg total) by mouth 3 (three) times daily with meals. 90 tablet 0  . tacrolimus (PROGRAF) 5 MG capsule Take 5 mg by mouth 2 (two) times daily.     No current facility-administered medications for this visit.    On ROS today: negative unless reported in the HPI   Physical Examination   Vitals:   08/31/19 0817  BP: 125/77  Pulse: 79  Resp: 18  Temp: 97.6 F (36.4 C)  TempSrc: Oral  SpO2: 99%  Weight: 176 lb 3.2 oz (79.9 kg)  Height: 5\' 10"  (1.778 m)   Body mass index is 25.28 kg/m.  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 Approximately 5 cm x 4 cm, pulsatile. No tenderness. No overlying skin changes or thinning  Neurologic A&O; CN grossly intact    Medical Decision Making   Ryan [name] is a 59 y.o. male who presents with ESRD  On HD via right IJ Palidrome catheter. Patient has an aneurysm in distal segment of ligated left AV fistula near the Antecubital fossa. The aneurysms he had previously were severely degenerated and was very difficult to plicate so the fistula was ligated. He is not having any steal symptoms in the left upper extremity. He does not have any pain overlying this aneurysm. It does have pulsatility to it but there is no overlying skin thinning or concern for bleeding. He remains reluctant for any new access. He is against any access in his right upper extremity. It was previously discussed with him placing a AV graft in the left upper extremity but he remains resistant to do this as well. He is still hopeful to have another Kidney Transplant. He is currently content using his TDC. I re discussed with him his options for new access and risks of continued use of his temporary catheter however he does not want to consider further access at this time. I discussed patient with Dr. Oneida Alar in the office and he is willing to offer patient attempt at revision of this area at time of new access placement. I additionally offered for him to come back and see Dr. Oneida Alar at separate visit to discuss his options. Patient expressed his frustration at the whole process of ESRD and dialysis and its effects on his life. At this point he wishes to think about his options and he will call if he wishes to follow up regarding new access placement   [name], PA-C [facility] Office: [phone]  Clinic MD: Dr. Oneida Alar

## 2019-10-22 IMAGING — DX DG CHEST 1V PORT
1 series · 1 of 1 positions shown · non-contrast
Comparison: 10/19/2017

CLINICAL DATA: Dialysis catheter insertion

EXAM:
PORTABLE CHEST 1 VIEW

[chest ap]
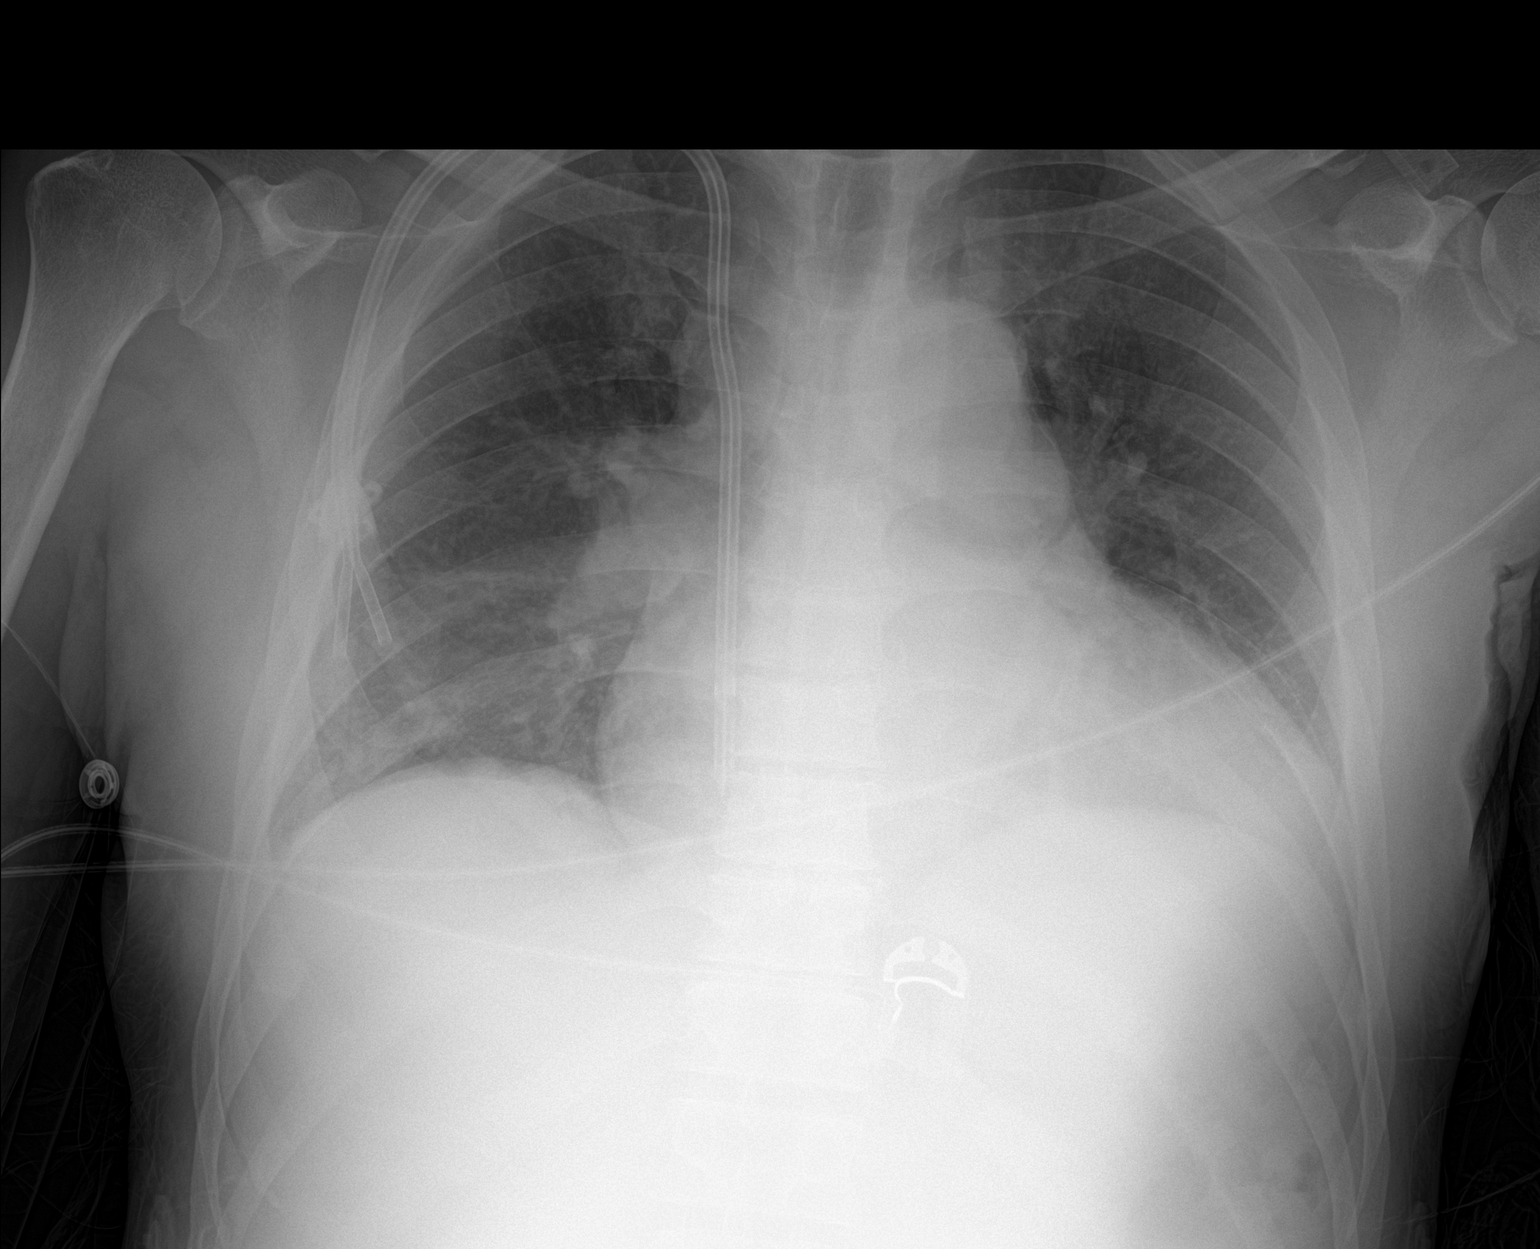

[1 of 1 positions shown; findings below may reference images not displayed]

FINDINGS: Dual lumen catheter inserted from a right internal jugular approach.
Tips are in the right atrium. Chronically enlarged cardiac
silhouette. Possible venous hypertension without frank edema. No
effusions. No focal pulmonary finding. No pneumothorax.
IMPRESSION: Central line tips in the right atrium.  No pneumothorax.

Cardiomegaly.  Possible venous hypertension.

## 2019-10-25 ENCOUNTER — Emergency Department (HOSPITAL_COMMUNITY)
Admission: EM | Admit: 2019-10-25 | Discharge: 2019-10-26 | Disposition: A | Discharge status: Home / Self Care | Payer: Medicare HMO | Attending: Emergency Medicine | Admitting: Emergency Medicine

## 2019-10-25 ENCOUNTER — Encounter (HOSPITAL_COMMUNITY): Payer: Self-pay | Admitting: Emergency Medicine

## 2019-10-25 DIAGNOSIS — Z79899 Other long term (current) drug therapy: Secondary | ICD-10-CM | POA: Diagnosis not present

## 2019-10-25 DIAGNOSIS — N186 End stage renal disease: Secondary | ICD-10-CM | POA: Insufficient documentation

## 2019-10-25 DIAGNOSIS — D649 Anemia, unspecified: Secondary | ICD-10-CM | POA: Diagnosis not present

## 2019-10-25 DIAGNOSIS — I5032 Chronic diastolic (congestive) heart failure: Secondary | ICD-10-CM | POA: Insufficient documentation

## 2019-10-25 DIAGNOSIS — I132 Hypertensive heart and chronic kidney disease with heart failure and with stage 5 chronic kidney disease, or end stage renal disease: Secondary | ICD-10-CM | POA: Diagnosis not present

## 2019-10-25 DIAGNOSIS — R42 Dizziness and giddiness: Secondary | ICD-10-CM | POA: Diagnosis present

## 2019-10-25 DIAGNOSIS — R5383 Other fatigue: Secondary | ICD-10-CM | POA: Diagnosis not present

## 2019-10-25 DIAGNOSIS — R231 Pallor: Secondary | ICD-10-CM | POA: Insufficient documentation

## 2019-10-25 DIAGNOSIS — R000 Tachycardia, unspecified: Secondary | ICD-10-CM | POA: Insufficient documentation

## 2019-10-25 LAB — CBC
HCT: 22 % — ABNORMAL LOW (ref 39.0–52.0)
Hemoglobin: 6.6 g/dL — CL (ref 13.0–17.0)
MCH: 30 pg (ref 26.0–34.0)
MCHC: 30 g/dL (ref 30.0–36.0)
MCV: 100 fL (ref 80.0–100.0)
Platelets: 371 10*3/uL (ref 150–400)
RBC: 2.2 MIL/uL — ABNORMAL LOW (ref 4.22–5.81)
RDW: 21.4 % — ABNORMAL HIGH (ref 11.5–15.5)
WBC: 11.7 10*3/uL — ABNORMAL HIGH (ref 4.0–10.5)
nRBC: 0.5 % — ABNORMAL HIGH (ref 0.0–0.2)

## 2019-10-25 LAB — COMPREHENSIVE METABOLIC PANEL
ALT: 16 U/L (ref 0–44)
AST: 18 U/L (ref 15–41)
Albumin: 3.5 g/dL (ref 3.5–5.0)
Alkaline Phosphatase: 112 U/L (ref 38–126)
Anion gap: 13 (ref 5–15)
BUN: 23 mg/dL — ABNORMAL HIGH (ref 6–20)
CO2: 30 mmol/L (ref 22–32)
Calcium: 8.9 mg/dL (ref 8.9–10.3)
Chloride: 96 mmol/L — ABNORMAL LOW (ref 98–111)
Creatinine, Ser: 5.26 mg/dL — ABNORMAL HIGH (ref 0.61–1.24)
GFR calc Af Amer: 13 mL/min — ABNORMAL LOW (ref >60)
GFR calc non Af Amer: 11 mL/min — ABNORMAL LOW (ref >60)
Glucose, Bld: 112 mg/dL — ABNORMAL HIGH (ref 70–99)
Potassium: 4 mmol/L (ref 3.5–5.1)
Sodium: 139 mmol/L (ref 135–145)
Total Bilirubin: 0.6 mg/dL (ref 0.3–1.2)
Total Protein: 7.5 g/dL (ref 6.5–8.1)

## 2019-10-25 NOTE — ED Triage Notes (Signed)
Patient sent by dialysis center for low hemoglobin. Reports generalized weakness/fatigue X several days. T,Th, S. Did have full treatment today.

## 2019-10-26 LAB — POC OCCULT BLOOD, ED: Fecal Occult Bld: NEGATIVE

## 2019-10-26 LAB — PREPARE RBC (CROSSMATCH)

## 2019-10-26 LAB — HEMOGLOBIN AND HEMATOCRIT, BLOOD
HCT: 28.1 % — ABNORMAL LOW (ref 39.0–52.0)
Hemoglobin: 8.9 g/dL — ABNORMAL LOW (ref 13.0–17.0)

## 2019-10-26 IMAGING — CT CT CHEST W/ CM
2 of 4 series · 15 of 36 positions shown, 18 images · IV contrast (omnipaque)
Comparison: None.

CLINICAL DATA: Cough and shortness of breath.

EXAM:
CT CHEST WITH CONTRAST
TECHNIQUE: Multidetector CT imaging of the chest was performed during
intravenous contrast administration.
CONTRAST:  75mL OMNIPAQUE IOHEXOL 300 MG/ML  SOLN

[Series 4: axial st · axial · 0.77mm/px · z∈[-119,+169]mm · 12 of 172 slices shown, 15 images]
[im 14/172  mediastinal]
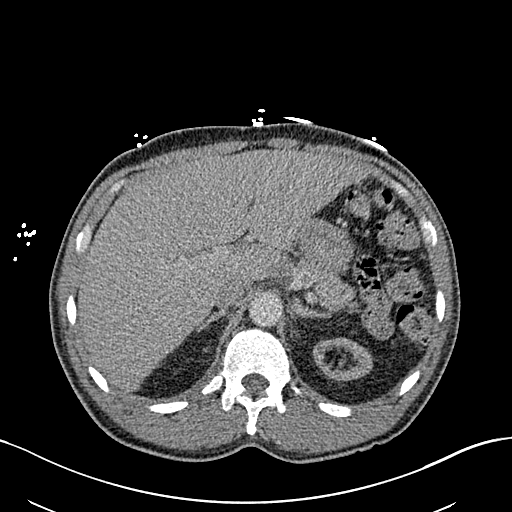
[im 14/172  lung]
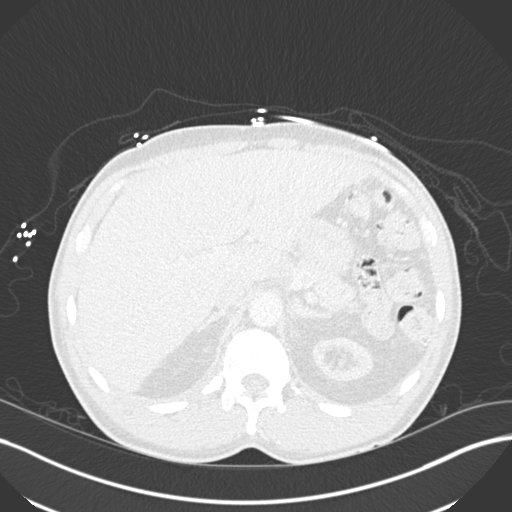
[im 27/172  lung]
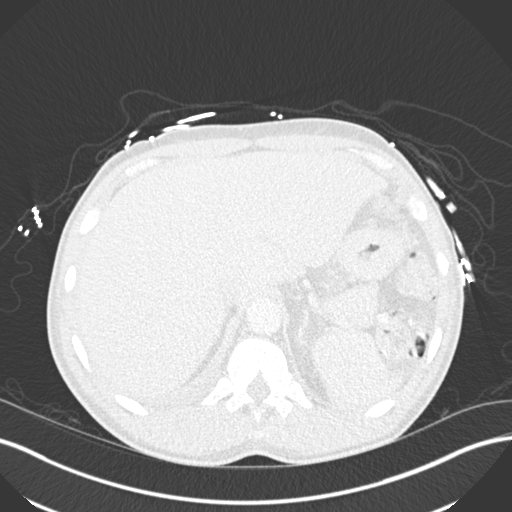
[im 40/172  lung]
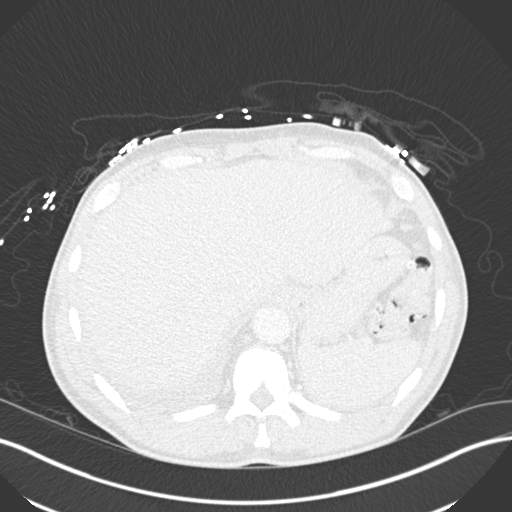
[im 53/172  lung]
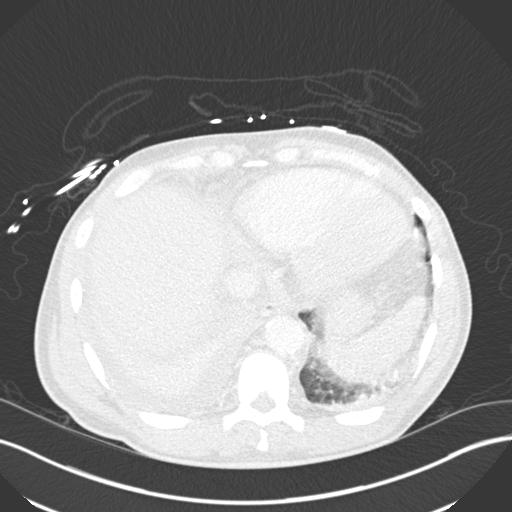
[im 66/172  mediastinal]
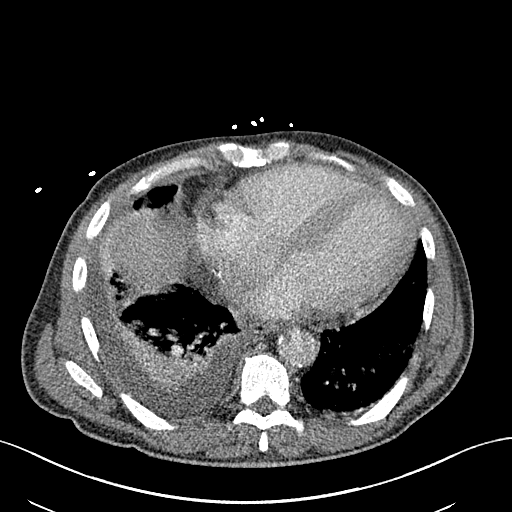
[im 66/172  lung]
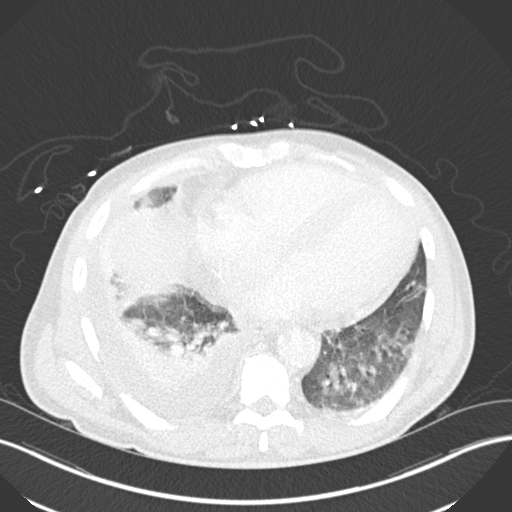
[im 79/172  lung]
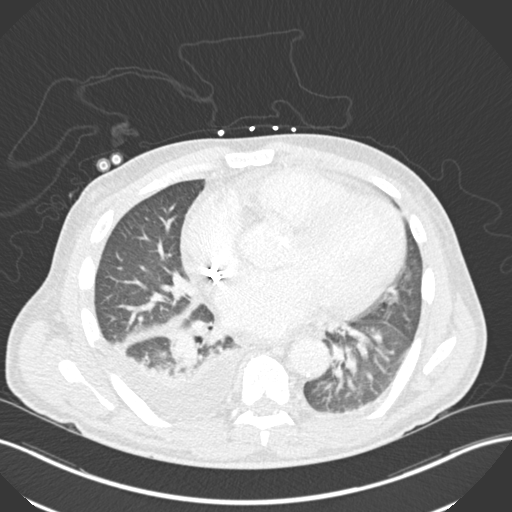
[im 93/172  lung]
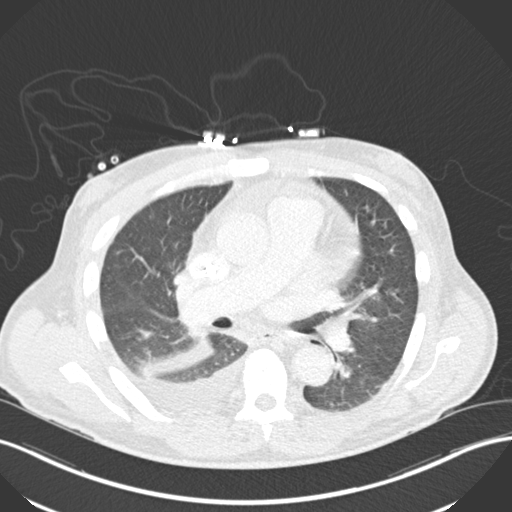
[im 106/172  lung]
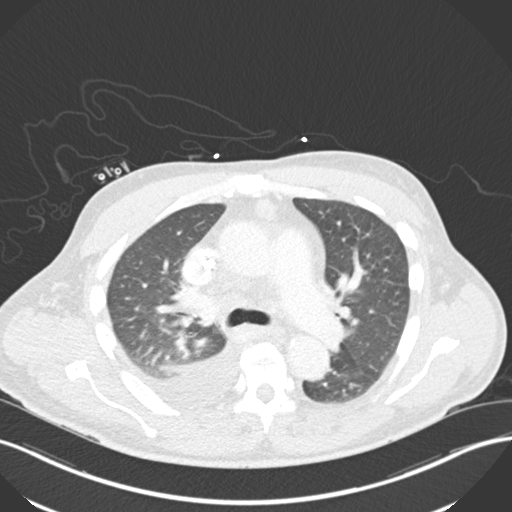
[im 119/172  mediastinal]
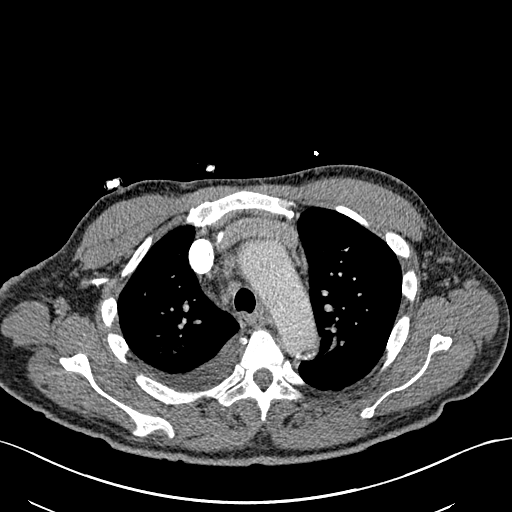
[im 119/172  lung]
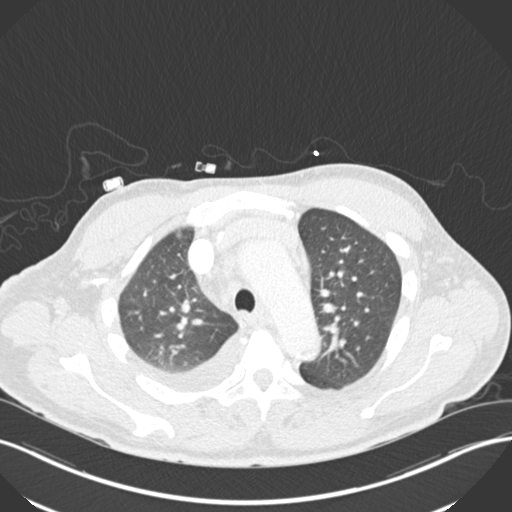
[im 132/172  lung]
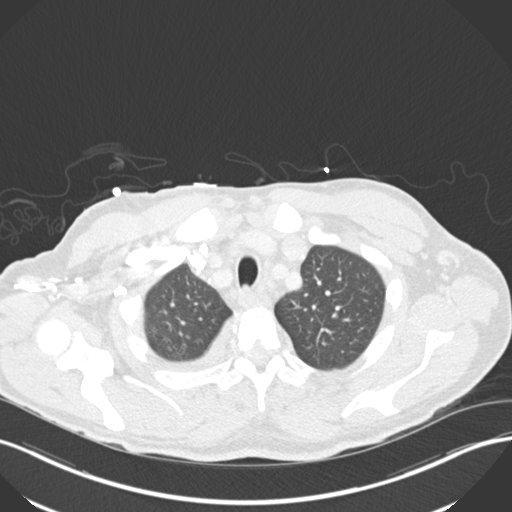
[im 145/172  lung]
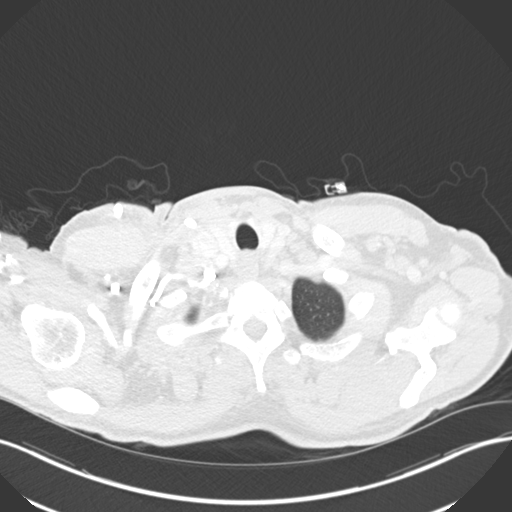
[im 158/172  lung]
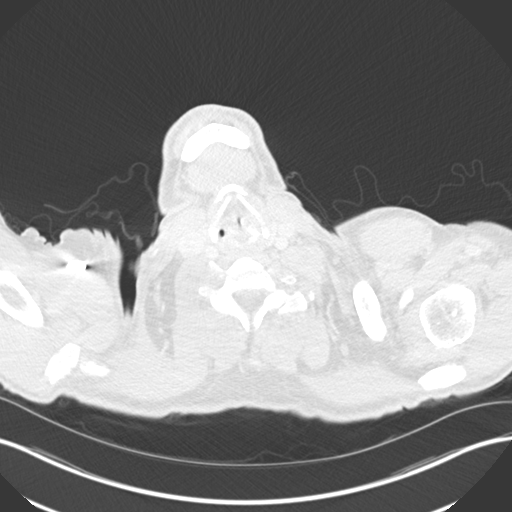

[Series 8: coronal · coronal · 0.64mm/px · 3 of 134 slices shown]
[im 27/134  lung]
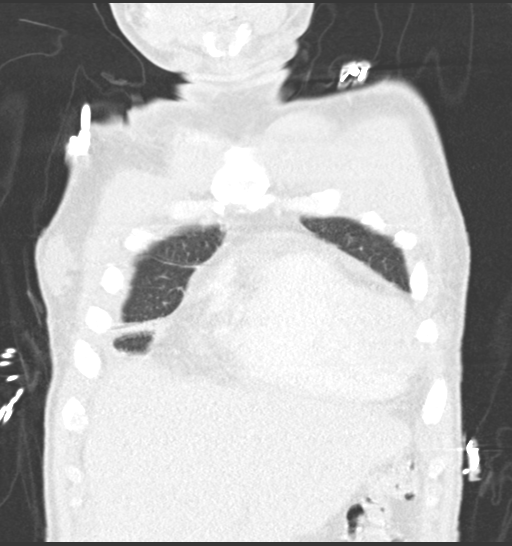
[im 54/134  lung]
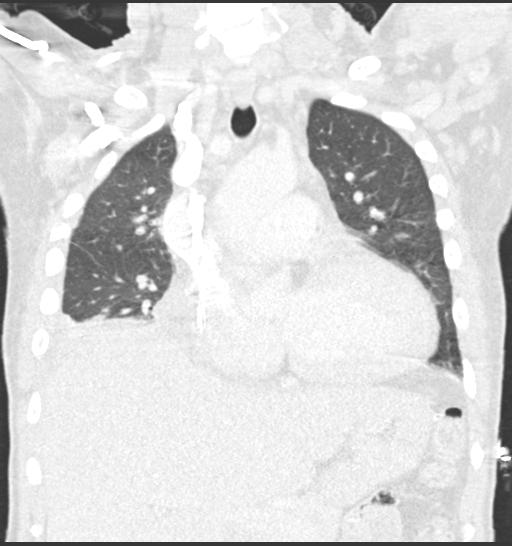
[im 80/134  lung]
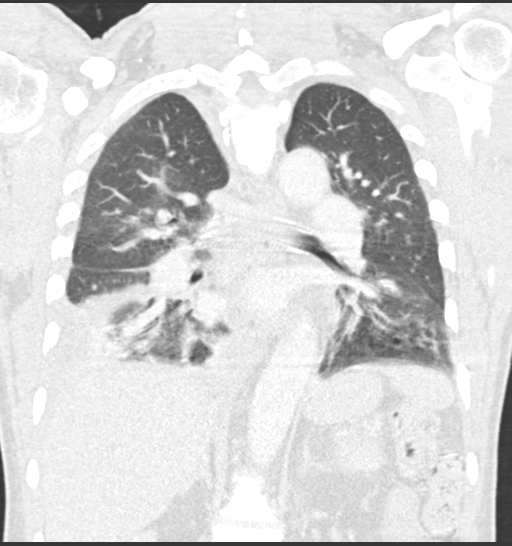

[15 of 36 positions shown; findings below may reference images not displayed]

FINDINGS: Cardiovascular: Heart is enlarged. Tiny pericardial effusion
evident. Right IJ central line tip is positioned in the upper to mid
right atrium. Atherosclerotic calcification is noted in the wall of
the thoracic aorta.

Mediastinum/Nodes: Numerous tiny lymph nodes are seen in the
mediastinum. 13 mm short axis prevascular node (67/4) is mildly
enlarged. There is no hilar lymphadenopathy. The esophagus has
normal imaging features. Increased number of lymph nodes are seen in
the axillary regions and subpectoral spaces bilaterally.

Lungs/Pleura: The central tracheobronchial airways are patent. There
is bibasilar dependent lower lobe collapse/consolidation, right
greater than left. Small right pleural effusion associated. No
overtly suspicious nodule or mass.

Upper Abdomen: Unremarkable.

Musculoskeletal: No worrisome lytic or sclerotic osseous
abnormality. Bilateral gynecomastia.
IMPRESSION: 1. Right lower lobe collapse/consolidation with small right pleural
effusion.
2. Minimal atelectasis posterior left lower lobe.
3. Increased number of nonenlarged lymph nodes in the mediastinum
and axillary regions, nonspecific but potentially reactive. There is
a single prevascular lymph node which is borderline to mildly
enlarged at 13 mm short axis. Follow-up CT chest in 3 months could
be used to ensure stability.
4. Cardiomegaly.
5.  Aortic Atherosclerois (ORS4H-170.0)

## 2019-10-26 MED ORDER — SODIUM CHLORIDE 0.9% IV SOLUTION: Freq: Once | INTRAVENOUS | Status: AC

## 2019-10-26 NOTE — ED Notes (Signed)
Patient verbalizes understanding of discharge instructions. Opportunity for questioning and answers were provided. Armband removed by staff. Patient discharged from ED.  

## 2019-10-26 NOTE — ED Provider Notes (Signed)
[facility] EMERGENCY DEPARTMENT Provider Note   CSN: [id number] Arrival date & time: 10/25/19  1821     History Chief Complaint  Patient presents with  . Weakness  . Abnormal Lab    Ryan [name] is a 59 y.o. male.  Patient with history of ESRD-HD, HTN, HLD presents with lightheadedness and fatigue for the past 1 1/2 weeks. He reports his hemoglobin has been low and he has been receiving iron infusions while getting dialysis. He was told last week he needed to come to the hospital for a transfusion and arrives tonight. No syncope. He reports having a transfusion last year due to diverticular bleed but denies melena, abdominal pain, diarrhea. No chest pain, SOB. He reports having received a full session of dialysis as scheduled yesterday.  The history is provided by the patient. No language interpreter was used.       Past Medical History:  Diagnosis Date  . Allergy   . Blood transfusion without reported diagnosis   . Dialysis patient ([place])    Tues,thurs,sat  . ESRD (end stage renal disease) ([place])   . Hyperlipidemia   . Hypertension     Patient Active Problem List   Diagnosis Date Noted  . Acute lower GI bleeding 03/09/2019  . Diverticulitis of colon with bleeding 03/09/2019  . Acute respiratory failure with hypoxia ([place]) 12/25/2017  . Cough 10/30/2017  . 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 Chronic diastolic heart failure, NYHA class 3 ([place]) 10/18/2017  . Acute pulmonary edema ([facility])   . Hyperlipidemia 09/19/2017  . Shortness of breath 09/19/2017  . Acute respiratory distress 09/19/2017  . PND (paroxysmal nocturnal dyspnea) 09/14/2017  . DOE (dyspnea on exertion) 09/14/2017  . Chest pain with moderate risk for cardiac etiology 09/14/2017  . Recurrent right pleural effusion 08/28/2017  . ESRD on dialysis ([place]) 08/27/2017  . Bloody drainage from penis 08/27/2017  . Lesion of vertebra 07/13/2017  . Renal  transplant, status post 08/26/2016  . Hypertension 08/26/2016  . Anemia of chronic disease 04/09/2016  . EKG abnormalities   . 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 A/V FISTULAGRAM Left 12/15/2016   Procedure: A/V Fistulagram - left;  Surgeon: Angelia Mould, MD;  Location: [facility];  Service: Cardiovascular;  Laterality: Left;  . AV FISTULA PLACEMENT Left 08/28/2016   Procedure: LEFT UPPER  ARM ARTERIOVENOUS (AV) FISTULA CREATION;  Surgeon: Angelia Mould, MD;  Location: [facility];  Service: Vascular;  Laterality: Left;  . DIALYSIS/PERMA CATHETER INSERTION Right 12/21/2017   Procedure: INSERTION OF DIALYSIS CATHETER Right Internal Jugular .;  Surgeon: Elam Dutch, MD;  Location: [facility];  Service: Vascular;  Laterality: Right;  . FISTULA SUPERFICIALIZATION Left 09/23/2017   Procedure: FISTULA PLICATION LEFT ARM;  Surgeon: Elam Dutch, MD;  Location: [facility];  Service: Vascular;  Laterality: Left;  . FISTULOGRAM Left 12/21/2017   Procedure: FISTULOGRAM with Balloon Angioplasty.;  Surgeon: Elam Dutch, MD;  Location: [facility] OR;  Service: Vascular;  Laterality: Left;  . IR THORACENTESIS ASP PLEURAL SPACE W/IMG GUIDE  08/22/2017   1.2 L -right-sided  . IR THORACENTESIS ASP PLEURAL SPACE W/IMG GUIDE  10/19/2017  . REVISON OF ARTERIOVENOUS FISTULA Left 5/32/9924   Procedure: PLICATION and Ligation of F LEFT ARM ARTERIOVENOUS FISTULA;  Surgeon: Elam Dutch, MD;  Location: [facility] OR;  Service: Vascular;  Laterality: Left;  . stent in kidneys  dec 2017  . TRANSTHORACIC ECHOCARDIOGRAM  09/22/2017    Severe LVH.  Normal function -EF 55-60%.  GRII DD.  Moderate RV dilation with mildly reduced RV function.   Fixed right coronary cusp with very mild aortic stenosis.  The myocardium has a speckled appearance --> .   Recommend cardiac MRI to evaluate for amyloid       Family History  Problem Relation Age of  Onset  . Hypertension Mother   . Heart disease Mother 35       By his report, he thinks that she had heart attack.  . Stroke Mother   . Cancer Father   . Kidney cancer Father   . Hypertension Sister   . Heart disease Maternal Grandmother   . Alcohol abuse Maternal Grandfather   . Mental illness Paternal Grandmother   . Learning disabilities Paternal Grandmother        Alzheimer's   . Stroke Paternal Grandfather   . Colon cancer Neg Hx   . Colon polyps Neg Hx   . Esophageal cancer Neg Hx   . Rectal cancer Neg Hx   . Stomach cancer Neg Hx     Social History   Tobacco Use  . Smoking status: Never Smoker  . Smokeless tobacco: Never Used  Vaping Use  . Vaping Use: Never used  Substance Use Topics  . Alcohol use: No  . Drug use: No    Home Medications Prior to Admission medications   Medication Sig Start Date End Date Taking? Authorizing Provider  cinacalcet (SENSIPAR) 30 MG tablet Take 30 mg by mouth daily. 12/08/17   [provider]  cloNIDine (CATAPRES) 0.1 MG tablet Take 1 tablet (0.1 mg total) by mouth 2 (two) times daily. 09/24/17   Sheikh, Omair Latif, DO  furosemide (LASIX) 80 MG tablet TAKE 1 TABLET (80 MG TOTAL) BY MOUTH 2 (TWO) TIMES DAILY. 03/29/18   Orma Flaming, MD  labetalol (NORMODYNE) 300 MG tablet TAKE 1 TABLET (300 MG TOTAL) BY MOUTH 2 (TWO) TIMES DAILY. OFFICE VISIT NEEDED Patient taking differently: Take 300 mg by mouth 2 (two) times daily.  11/21/18   Leonie Man, MD  LOKELMA 10 g PACK packet Take 1 packet by mouth 3 (three) times a week. Monday, Wednesday, and Friday 02/01/19   [provider]  loratadine (CLARITIN) 10 MG tablet Take 10 mg by mouth daily as needed for allergies.    [provider]  losartan (COZAAR) 50 MG tablet Take 1 tablet (50 mg total) by mouth daily. 04/12/18   Leonie Man, MD  multivitamin (RENA-VIT) TABS tablet Take 1 tablet by mouth daily.    [provider]  NIFEdipine (PROCARDIA  XL/ADALAT-CC) 60 MG 24 hr tablet Take 60 mg by mouth 2 (two) times daily.    [provider]  omeprazole (PRILOSEC) 20 MG capsule TAKE 1 CAPSULE BY MOUTH EVERY DAY Patient taking differently: Take 20 mg by mouth daily.  08/10/18   Mannam, Hart Robinsons, MD  pravastatin (PRAVACHOL) 80 MG tablet Take 1 tablet (80 mg total) by mouth at bedtime. 12/02/17   Orma Flaming, MD  sevelamer carbonate (RENVELA) 800 MG tablet Take 1 tablet (800 mg total) by mouth 3 (three) times daily with meals. 09/24/17   Raiford Noble Latif, DO  tacrolimus (PROGRAF) 5 MG capsule Take 5 mg by mouth 2 (two) times daily.    [provider]    Allergies    Azithromycin  Review of Systems   Review of Systems  Constitutional: Positive for fatigue. Negative for chills, diaphoresis and fever.  HENT: Negative.   Respiratory: Negative.  Negative for shortness of breath.   Cardiovascular: Negative.  Negative for chest pain.  Gastrointestinal: Negative.  Negative for abdominal pain, blood in stool, diarrhea and vomiting.  Musculoskeletal: Negative.   Skin: Negative.   Neurological: Positive for light-headedness. Negative for syncope and headaches.    Physical Exam Updated Vital Signs BP 120/77 (BP Location: Right Arm)   Pulse 95   Temp 98.9 F (37.2 C) (Oral)   Resp 16   Ht 5\' 10"  (1.778 m)   Wt 81.6 kg   SpO2 100%   BMI 25.83 kg/m   Physical Exam Vitals and nursing note reviewed.  Constitutional:      Appearance: He is well-developed.  HENT:     Head: Normocephalic.  Eyes:     Comments: Conjunctival pallor.   Cardiovascular:     Rate and Rhythm: Normal rate and regular rhythm.  Pulmonary:     Effort: Pulmonary effort is normal.     Breath sounds: Normal breath sounds. No wheezing, rhonchi or rales.  Abdominal:     General: Bowel sounds are normal. There is no distension.     Palpations: Abdomen is soft.     Tenderness: There is no abdominal tenderness. There is no guarding or rebound.    Musculoskeletal:        General: Normal range of motion.     Cervical back: Normal range of motion and neck supple.  Skin:    General: Skin is warm and dry.     Findings: No rash.  Neurological:     Mental Status: He is alert and oriented to person, place, and time.     ED Results / Procedures / Treatments   Labs (all labs ordered are listed, but only abnormal results are displayed) Labs Reviewed  COMPREHENSIVE METABOLIC PANEL - Abnormal; Notable for the following components:      Result Value   Chloride 96 (*)    Glucose, Bld 112 (*)    BUN 23 (*)    Creatinine, Ser 5.26 (*)    GFR calc non Af Amer 11 (*)    GFR calc Af Amer 13 (*)    All other components within normal limits  CBC - Abnormal; Notable for the following components:   WBC 11.7 (*)    RBC 2.20 (*)    Hemoglobin 6.6 (*)    HCT 22.0 (*)    RDW 21.4 (*)    nRBC 0.5 (*)    All other components within normal limits  POC OCCULT BLOOD, ED  TYPE AND SCREEN  PREPARE RBC (CROSSMATCH)    EKG EKG Interpretation  Date/Time:  Tuesday October 25 2019 19:51:03 EDT Ventricular Rate:  103 PR Interval:  162 QRS Duration: 82 QT Interval:  348 QTC Calculation: 455 R Axis:   46 Text Interpretation: Sinus tachycardia Anteroseptal infarct , age undetermined Abnormal ECG When compared with ECG of 12/25/2017, Right axis deviation is no longer present Confirmed by Delora Fuel ([id number]) on 10/25/2019 8:27:57 PM   Radiology No results found.  Procedures Procedures (including critical care time) CRITICAL CARE Performed by: Dewaine Oats   Total critical care time: 35 minutes  Critical care time was exclusive of separately billable procedures and treating other patients.  Critical care was necessary to treat or prevent imminent or life-threatening deterioration.  Critical care was time spent personally by me on the following activities: development of  treatment plan with patient and/or surrogate as well as nursing,  discussions with consultants, evaluation of patient's response to treatment, examination of patient, obtaining history from patient or surrogate, ordering and performing treatments and interventions, ordering and review of laboratory studies, ordering and review of radiographic studies, pulse oximetry and re-evaluation of patient's condition.  Medications Ordered in ED Medications  0.9 %  sodium chloride infusion (Manually program via Guardrails IV Fluids) (has no administration in time range)    ED Course  I have reviewed the triage vital signs and the nursing notes.  Pertinent labs & imaging results that were available during my care of the patient were reviewed by me and considered in my medical decision making (see chart for details).  Clinical Course as of Oct 25 733  Wed Oct 26, 2019  4010 RN informs me patient has finished his first unit of blood transfused.  They will start the second unit.   [SJ]    Clinical Course User Index [SJ] Joy, Helane Gunther, PA-C   MDM Rules/Calculators/A&P                          Patient to ED with known low hemoglobin for the past week with symptoms of lightheadedness and fatigue.   He is overall well appearing. Hemodynamically stable. Hgb 6.6. Chart reviewed. Baseline hemoglobin appears to be 9-10. He is guaiac negative. No abdominal tenderness. Suspect anemia is secondary to chronic renal disease.   He will require transfusion but feel he will be stable for discharge afterward. He has been seen and evaluated by Dr. Wyvonnia Dusky who is in agreement. Will infuse 2 units and recheck H&H prior to discharge.   Patient care signed out to [facility], PA-C, pending review of repeat hemoglobin and re-examination to insure no ss/sxs of fluid overload. Anticipate discharge home.   Final Clinical Impression(s) / ED Diagnoses Final diagnoses:  None   1. Symptomatic anemia  Rx / DC Orders ED Discharge Orders    None       Charlann Lange, PA-C 10/26/19  0741    Ezequiel Essex, MD 10/26/19 [phone]

## 2019-10-26 NOTE — ED Provider Notes (Signed)
Ryan [name] is a 59 y.o. male, presenting to the ED with fatigue.  Patient has no complaints upon my interview with him.   HPI from Ryan Lange, PA-C: "Patient with history of ESRD-HD, HTN, HLD presents with lightheadedness and fatigue for the past 1 1/2 weeks. He reports his hemoglobin has been low and he has been receiving iron infusions while getting dialysis. He was told last week he needed to come to the hospital for a transfusion and arrives tonight. No syncope. He reports having a transfusion last year due to diverticular bleed but denies melena, abdominal pain, diarrhea. No chest pain, SOB. He reports having received a full session of dialysis as scheduled yesterday."  Past Medical History:  Diagnosis Date  . Allergy   . Blood transfusion without reported diagnosis   . Dialysis patient (Ryan [name])    Tues,thurs,sat  . ESRD (end stage renal disease) (Ryan [name])   . Hyperlipidemia   . Hypertension      Physical Exam  BP 137/82   Pulse 91   Temp 98.8 F (37.1 C) (Oral)   Resp 16   Ht 5\' 10"  (1.778 m)   Wt 81.6 kg   SpO2 100%   BMI 25.83 kg/m   Physical Exam Vitals and nursing note reviewed.  Constitutional:      General: He is not in acute distress.    Appearance: He is well-developed. He is not diaphoretic.  HENT:     Head: Normocephalic and atraumatic.     Mouth/Throat:     Mouth: Mucous membranes are moist.     Pharynx: Oropharynx is clear.  Eyes:     Conjunctiva/sclera: Conjunctivae normal.  Cardiovascular:     Rate and Rhythm: Normal rate and regular rhythm.     Pulses: Normal pulses.          Radial pulses are 2+ on the right side and 2+ on the left side.       Posterior tibial pulses are 2+ on the right side and 2+ on the left side.     Heart sounds: Normal heart sounds.     Comments: Tactile temperature in the extremities appropriate and equal bilaterally. Pulmonary:     Effort: Pulmonary effort is normal. No respiratory distress.     Breath sounds:  Normal breath sounds.  Abdominal:     Palpations: Abdomen is soft.     Tenderness: There is no abdominal tenderness. There is no guarding.  Musculoskeletal:     Cervical back: Neck supple.     Right lower leg: No edema.     Left lower leg: No edema.  Lymphadenopathy:     Cervical: No cervical adenopathy.  Skin:    General: Skin is warm and dry.  Neurological:     Mental Status: He is alert.  Psychiatric:        Mood and Affect: Mood and affect normal.        Speech: Speech normal.        Behavior: Behavior normal.     ED Course/Procedures     Procedures   Abnormal Labs Reviewed  COMPREHENSIVE METABOLIC PANEL - Abnormal; Notable for the following components:      Result Value   Chloride 96 (*)    Glucose, Bld 112 (*)    BUN 23 (*)    Creatinine, Ser 5.26 (*)    GFR calc non Af Amer 11 (*)    GFR calc Af Amer 13 (*)    All other components  within normal limits  CBC - Abnormal; Notable for the following components:   WBC 11.7 (*)    RBC 2.20 (*)    Hemoglobin 6.6 (*)    HCT 22.0 (*)    RDW 21.4 (*)    nRBC 0.5 (*)    All other components within normal limits  HEMOGLOBIN AND HEMATOCRIT, BLOOD - Abnormal; Notable for the following components:   Hemoglobin 8.9 (*)    HCT 28.1 (*)    All other components within normal limits     MDM   Clinical Course as of Oct 25 1324  Wed Oct 26, 2019  0732 RN informs me patient has finished his first unit of blood transfused.  They will start the second unit.   [SJ]  [id number] Patient reassessed.  States he feels much better.   [SJ]  1027 Second unit finished transfusing. Patient appears comfortable.  No increased work of breathing or other signs of respiratory distress.   [SJ]    Clinical Course User Index [SJ] Ryan Bender, PA-C    Patient care handoff report received from Ryan [name], [place]. Plan: Reassess patient during blood transfusions.  Assure hemoglobin is rising by repeating H&H.   Patient presents with  lightheadedness and fatigue.  Found to have hemoglobin lower than his typical level.  Hemoccult negative. 2 units PRBCs transfused here in the ED.  H&H rose to an acceptable level for discharge.  Ambulated without shortness of breath, hypoxia, or lightheadedness.   Vitals:   10/26/19 0438 10/26/19 0515 10/26/19 0521 10/26/19 0700  BP: 120/77 126/79  137/82  Pulse: 95 94  91  Resp: 16 16    Temp: 98.9 F (37.2 C)  98.8 F (37.1 C)   TempSrc: Oral  Oral   SpO2: 100% 99%  100%  Weight:      Height:          Ryan Bender, PA-C 10/26/19 1330    Ryan [name], Ryan Allegra, MD 10/26/19 1530

## 2019-10-26 NOTE — ED Notes (Signed)
Lunch Tray Ordered @ 1059.  

## 2019-10-26 NOTE — Discharge Instructions (Addendum)
Follow up with dialysis for regular treatment and recheck of low hemoglobin as indicated.   Return to the emergency department with any new or worsening symptoms.

## 2019-10-27 LAB — TYPE AND SCREEN
ABO/RH(D): O POS
Antibody Screen: POSITIVE
Unit division: 0
Unit division: 0

## 2019-10-27 LAB — BPAM RBC
Blood Product Expiration Date: 202108272359
Blood Product Expiration Date: 202108272359
ISSUE DATE / TIME: 202108040445
ISSUE DATE / TIME: 202108040724
Unit Type and Rh: 5100
Unit Type and Rh: 5100

## 2019-11-02 ENCOUNTER — Encounter: Payer: Self-pay | Admitting: Gastroenterology

## 2020-01-02 ENCOUNTER — Ambulatory Visit: Payer: Medicare HMO | Admitting: Gastroenterology

## 2020-01-09 ENCOUNTER — Encounter: Payer: Self-pay | Admitting: Physician Assistant

## 2020-01-20 DIAGNOSIS — U071 COVID-19: Secondary | ICD-10-CM | POA: Insufficient documentation

## 2020-05-10 ENCOUNTER — Ambulatory Visit: Payer: Medicare HMO | Admitting: Vascular Surgery

## 2020-05-10 ENCOUNTER — Other Ambulatory Visit: Payer: Self-pay

## 2020-05-10 ENCOUNTER — Encounter: Payer: Self-pay | Admitting: Vascular Surgery

## 2020-05-10 VITALS — BP 118/80 | HR 110 | Temp 98.2°F | Resp 20 | Wt 185.0 lb

## 2020-05-10 DIAGNOSIS — Z992 Dependence on renal dialysis: Secondary | ICD-10-CM

## 2020-05-10 DIAGNOSIS — N186 End stage renal disease: Secondary | ICD-10-CM | POA: Diagnosis not present

## 2020-05-10 NOTE — H&P (View-Only) (Signed)
Patient is a 60 year old male who returns for follow-up today.  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  He currently has a right-sided dialysis catheter.  In July [id number] he underwent plication of a segment of the fistula.  The fistula was subsequently ligated in September 2019.  He has been catheter dependent since then.  Last saw him in October 2019 and we discussed placing a radial cephalic fistula on the right side versus a new access in the left arm most likely a graft.  He would like to proceed with an AV graft in the left arm currently.  His current dialysis day is Tuesday Thursday Saturday.  Past Surgical History:  Procedure Laterality Date  . A/V FISTULAGRAM Left 12/15/2016   Procedure: A/V Fistulagram - left;  Surgeon: Angelia Mould, MD;  Location: [facility];  Service: Cardiovascular;  Laterality: Left;  . AV FISTULA PLACEMENT Left 08/28/2016   Procedure: LEFT UPPER  ARM ARTERIOVENOUS (AV) FISTULA CREATION;  Surgeon: Angelia Mould, MD;  Location: [facility];  Service: Vascular;  Laterality: Left;  . DIALYSIS/PERMA CATHETER INSERTION Right 12/21/2017   Procedure: INSERTION OF DIALYSIS CATHETER Right Internal Jugular .;  Surgeon: Elam Dutch, MD;  Location: [facility];  Service: Vascular;  Laterality: Right;  . FISTULA SUPERFICIALIZATION Left 09/23/2017   Procedure: FISTULA PLICATION LEFT ARM;  Surgeon: Elam Dutch, MD;  Location: [facility];  Service: Vascular;  Laterality: Left;  . FISTULOGRAM Left 12/21/2017   Procedure: FISTULOGRAM with Balloon Angioplasty.;  Surgeon: Elam Dutch, MD;  Location: [facility];  Service: Vascular;  Laterality: Left;  . IR THORACENTESIS ASP PLEURAL SPACE W/IMG GUIDE  08/22/2017   1.2 L -right-sided  . IR THORACENTESIS ASP PLEURAL SPACE W/IMG GUIDE  10/19/2017  . REVISON OF ARTERIOVENOUS FISTULA Left [id number]   Procedure: PLICATION and  Ligation of F LEFT ARM ARTERIOVENOUS FISTULA;  Surgeon: Elam Dutch, MD;  Location: [facility] OR;  Service: Vascular;  Laterality: Left;  . stent in kidneys     dec 2017  . TRANSTHORACIC ECHOCARDIOGRAM  09/22/2017    Severe LVH.  Normal function -EF 55-60%.  GRII DD.  Moderate RV dilation with mildly reduced RV function.   Fixed right coronary cusp with very mild aortic stenosis.  The myocardium has a speckled appearance --> .   Recommend cardiac MRI to evaluate for amyloid     Past Medical History:  Diagnosis Date  . Allergy   . Blood transfusion without reported diagnosis   . Dialysis patient ([place])    Tues,thurs,sat  . ESRD (end stage renal disease) ([place])   . Hyperlipidemia   . Hypertension      Current Outpatient Medications on File Prior to Visit  Medication Sig Dispense Refill  . cinacalcet (SENSIPAR) 30 MG tablet Take 30 mg by mouth daily.  11  . cloNIDine (CATAPRES) 0.1 MG tablet Take 1 tablet (0.1 mg total) by mouth 2 (two) times daily. 60 tablet 11  . cloNIDine (CATAPRES) 0.2 MG tablet Take 0.2 mg by mouth 2 (two) times daily.    Marland Kitchen LOKELMA 10 g PACK packet Take 1 packet by mouth 3 (three) times a week. Monday, Wednesday, and Friday    . multivitamin (RENA-VIT) TABS tablet Take 1 tablet by mouth daily.    . furosemide (LASIX) 80 MG tablet TAKE 1 TABLET (80 MG TOTAL) BY MOUTH 2 (TWO)  TIMES DAILY. (Patient not taking: No sig reported) 180 tablet 1  . labetalol (NORMODYNE) 300 MG tablet TAKE 1 TABLET (300 MG TOTAL) BY MOUTH 2 (TWO) TIMES DAILY. OFFICE VISIT NEEDED (Patient not taking: No sig reported) 60 tablet 0  . losartan (COZAAR) 50 MG tablet Take 1 tablet (50 mg total) by mouth daily. (Patient not taking: Reported on 05/10/2020) 90 tablet 1  . NIFEdipine (PROCARDIA XL/ADALAT-CC) 60 MG 24 hr tablet Take 60 mg by mouth 2 (two) times daily. (Patient not taking: Reported on 05/10/2020)    . omeprazole (PRILOSEC) 20 MG capsule TAKE 1 CAPSULE BY MOUTH EVERY DAY (Patient not taking: No  sig reported) 60 capsule 0  . pravastatin (PRAVACHOL) 80 MG tablet Take 1 tablet (80 mg total) by mouth at bedtime. (Patient not taking: No sig reported) 90 tablet 3  . sevelamer carbonate (RENVELA) 800 MG tablet Take 1 tablet (800 mg total) by mouth 3 (three) times daily with meals. (Patient not taking: No sig reported) 90 tablet 0  . tacrolimus (PROGRAF) 5 MG capsule Take 5 mg by mouth 2 (two) times daily. (Patient not taking: Reported on 05/10/2020)     No current facility-administered medications on file prior to visit.    Physical exam:  Vitals:   05/10/20 1240  BP: 118/80  Pulse: (!) 110  Resp: 20  Temp: 98.2 F (36.8 C)  SpO2: 97%  Weight: 185 lb (83.9 kg)    Extremities: 5 cm area of aneurysmal degeneration just above the antecubital crease left arm pulsatile in character  Neck: Right side dialysis catheter  Chest: Some collaterals along the left anterior chest wall  Data: Reviewed the patient's vein mapping from 2019 which shows a 5 mm left basilic vein and a 4 to 5 mm basilic and cephalic vein on the right side  Assessment: Patient needs long-term hemodialysis access.  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  Plan: 1.  Central venogram left side to see if he has adequate central veins to support and access on the left side.  This will be scheduled for tomorrow February 18.  Excision of aneurysm left antecubital region and then placement of a left basilic vein fistula versus left upper arm AV graft 05/21/2020.  Risk benefits possible complications of procedure details of AV graft discussed with the patient today include but not limited to bleeding infection graft phimosis.  He understands and agrees to proceed.  We will plan for general anesthesia as he will most laboratory require tourniquet to deal with aneurysm at his left antecubital area.  Ruta Hinds, MD [facility] [place] [place] Office: [phone]

## 2020-05-10 NOTE — Progress Notes (Addendum)
Patient is a 60 year old male who returns for follow-up today.  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  He currently has a right-sided dialysis catheter.  In July [id number] he underwent plication of a segment of the fistula.  The fistula was subsequently ligated in September 2019.  He has been catheter dependent since then.  Last saw him in October 2019 and we discussed placing a radial cephalic fistula on the right side versus a new access in the left arm most likely a graft.  He would like to proceed with an AV graft in the left arm currently.  His current dialysis day is Tuesday Thursday Saturday.  Past Surgical History:  Procedure Laterality Date  . A/V FISTULAGRAM Left 12/15/2016   Procedure: A/V Fistulagram - left;  Surgeon: Angelia Mould, MD;  Location: [facility];  Service: Cardiovascular;  Laterality: Left;  . AV FISTULA PLACEMENT Left 08/28/2016   Procedure: LEFT UPPER  ARM ARTERIOVENOUS (AV) FISTULA CREATION;  Surgeon: Angelia Mould, MD;  Location: [facility];  Service: Vascular;  Laterality: Left;  . DIALYSIS/PERMA CATHETER INSERTION Right 12/21/2017   Procedure: INSERTION OF DIALYSIS CATHETER Right Internal Jugular .;  Surgeon: Elam Dutch, MD;  Location: [facility];  Service: Vascular;  Laterality: Right;  . FISTULA SUPERFICIALIZATION Left 09/23/2017   Procedure: FISTULA PLICATION LEFT ARM;  Surgeon: Elam Dutch, MD;  Location: [facility];  Service: Vascular;  Laterality: Left;  . FISTULOGRAM Left 12/21/2017   Procedure: FISTULOGRAM with Balloon Angioplasty.;  Surgeon: Elam Dutch, MD;  Location: [facility];  Service: Vascular;  Laterality: Left;  . IR THORACENTESIS ASP PLEURAL SPACE W/IMG GUIDE  08/22/2017   1.2 L -right-sided  . IR THORACENTESIS ASP PLEURAL SPACE W/IMG GUIDE  10/19/2017  . REVISON OF ARTERIOVENOUS FISTULA Left [id number]   Procedure: PLICATION and  Ligation of F LEFT ARM ARTERIOVENOUS FISTULA;  Surgeon: Elam Dutch, MD;  Location: [facility];  Service: Vascular;  Laterality: Left;  . stent in kidneys     dec 2017  . TRANSTHORACIC ECHOCARDIOGRAM  09/22/2017    Severe LVH.  Normal function -EF 55-60%.  GRII DD.  Moderate RV dilation with mildly reduced RV function.   Fixed right coronary cusp with very mild aortic stenosis.  The myocardium has a speckled appearance --> .   Recommend cardiac MRI to evaluate for amyloid     Past Medical History:  Diagnosis Date  . Allergy   . Blood transfusion without reported diagnosis   . Dialysis patient ([place])    Tues,thurs,sat  . ESRD (end stage renal disease) ([place])   . Hyperlipidemia   . Hypertension      Current Outpatient Medications on File Prior to Visit  Medication Sig Dispense Refill  . cinacalcet (SENSIPAR) 30 MG tablet Take 30 mg by mouth daily.  11  . cloNIDine (CATAPRES) 0.1 MG tablet Take 1 tablet (0.1 mg total) by mouth 2 (two) times daily. 60 tablet 11  . cloNIDine (CATAPRES) 0.2 MG tablet Take 0.2 mg by mouth 2 (two) times daily.    Marland Kitchen LOKELMA 10 g PACK packet Take 1 packet by mouth 3 (three) times a week. Monday, Wednesday, and Friday    . multivitamin (RENA-VIT) TABS tablet Take 1 tablet by mouth daily.    . furosemide (LASIX) 80 MG tablet TAKE 1 TABLET (80 MG TOTAL) BY MOUTH 2 (TWO)  TIMES DAILY. (Patient not taking: No sig reported) 180 tablet 1  . labetalol (NORMODYNE) 300 MG tablet TAKE 1 TABLET (300 MG TOTAL) BY MOUTH 2 (TWO) TIMES DAILY. OFFICE VISIT NEEDED (Patient not taking: No sig reported) 60 tablet 0  . losartan (COZAAR) 50 MG tablet Take 1 tablet (50 mg total) by mouth daily. (Patient not taking: Reported on 05/10/2020) 90 tablet 1  . NIFEdipine (PROCARDIA XL/ADALAT-CC) 60 MG 24 hr tablet Take 60 mg by mouth 2 (two) times daily. (Patient not taking: Reported on 05/10/2020)    . omeprazole (PRILOSEC) 20 MG capsule TAKE 1 CAPSULE BY MOUTH EVERY DAY (Patient not taking: No  sig reported) 60 capsule 0  . pravastatin (PRAVACHOL) 80 MG tablet Take 1 tablet (80 mg total) by mouth at bedtime. (Patient not taking: No sig reported) 90 tablet 3  . sevelamer carbonate (RENVELA) 800 MG tablet Take 1 tablet (800 mg total) by mouth 3 (three) times daily with meals. (Patient not taking: No sig reported) 90 tablet 0  . tacrolimus (PROGRAF) 5 MG capsule Take 5 mg by mouth 2 (two) times daily. (Patient not taking: Reported on 05/10/2020)     No current facility-administered medications on file prior to visit.    Physical exam:  Vitals:   05/10/20 1240  BP: 118/80  Pulse: (!) 110  Resp: 20  Temp: 98.2 F (36.8 C)  SpO2: 97%  Weight: 185 lb (83.9 kg)    Extremities: 5 cm area of aneurysmal degeneration just above the antecubital crease left arm pulsatile in character  Neck: Right side dialysis catheter  Chest: Some collaterals along the left anterior chest wall  Data: Reviewed the patient's vein mapping from 2019 which shows a 5 mm left basilic vein and a 4 to 5 mm basilic and cephalic vein on the right side  Assessment: Patient needs long-term hemodialysis access.  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  Plan: 1.  Central venogram left side to see if he has adequate central veins to support and access on the left side.  This will be scheduled for tomorrow February 18.  Excision of aneurysm left antecubital region and then placement of a left basilic vein fistula versus left upper arm AV graft 05/21/2020.  Risk benefits possible complications of procedure details of AV graft discussed with the patient today include but not limited to bleeding infection graft phimosis.  He understands and agrees to proceed.  We will plan for general anesthesia as he will most laboratory require tourniquet to deal with aneurysm at his left antecubital area.  Ruta Hinds, MD [facility] [place] Office: [phone]

## 2020-05-10 NOTE — H&P (View-Only) (Signed)
Patient is a 60 year old male who returns for follow-up today.  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  He currently has a right-sided dialysis catheter.  In July [id number] he underwent plication of a segment of the fistula.  The fistula was subsequently ligated in September 2019.  He has been catheter dependent since then.  Last saw him in October 2019 and we discussed placing a radial cephalic fistula on the right side versus a new access in the left arm most likely a graft.  He would like to proceed with an AV graft in the left arm currently.  His current dialysis day is Tuesday Thursday Saturday.  Past Surgical History:  Procedure Laterality Date  . A/V FISTULAGRAM Left 12/15/2016   Procedure: A/V Fistulagram - left;  Surgeon: Angelia Mould, MD;  Location: [facility];  Service: Cardiovascular;  Laterality: Left;  . AV FISTULA PLACEMENT Left 08/28/2016   Procedure: LEFT UPPER  ARM ARTERIOVENOUS (AV) FISTULA CREATION;  Surgeon: Angelia Mould, MD;  Location: [place] [facility];  Service: Vascular;  Laterality: Left;  . DIALYSIS/PERMA CATHETER INSERTION Right 12/21/2017   Procedure: INSERTION OF DIALYSIS CATHETER Right Internal Jugular .;  Surgeon: Elam Dutch, MD;  Location: [facility];  Service: Vascular;  Laterality: Right;  . FISTULA SUPERFICIALIZATION Left 09/23/2017   Procedure: FISTULA PLICATION LEFT ARM;  Surgeon: Elam Dutch, MD;  Location: [facility];  Service: Vascular;  Laterality: Left;  . FISTULOGRAM Left 12/21/2017   Procedure: FISTULOGRAM with Balloon Angioplasty.;  Surgeon: Elam Dutch, MD;  Location: [facility] OR;  Service: Vascular;  Laterality: Left;  . IR THORACENTESIS ASP PLEURAL SPACE W/IMG GUIDE  08/22/2017   1.2 L -right-sided  . IR THORACENTESIS ASP PLEURAL SPACE W/IMG GUIDE  10/19/2017  . REVISON OF ARTERIOVENOUS FISTULA Left [id number]   Procedure: PLICATION and  Ligation of F LEFT ARM ARTERIOVENOUS FISTULA;  Surgeon: Elam Dutch, MD;  Location: [facility] OR;  Service: Vascular;  Laterality: Left;  . stent in kidneys     dec 2017  . TRANSTHORACIC ECHOCARDIOGRAM  09/22/2017    Severe LVH.  Normal function -EF 55-60%.  GRII DD.  Moderate RV dilation with mildly reduced RV function.   Fixed right coronary cusp with very mild aortic stenosis.  The myocardium has a speckled appearance --> .   Recommend cardiac MRI to evaluate for amyloid     Past Medical History:  Diagnosis Date  . Allergy   . Blood transfusion without reported diagnosis   . Dialysis patient ([place])    Tues,thurs,sat  . ESRD (end stage renal disease) ([place])   . Hyperlipidemia   . Hypertension      Current Outpatient Medications on File Prior to Visit  Medication Sig Dispense Refill  . cinacalcet (SENSIPAR) 30 MG tablet Take 30 mg by mouth daily.  11  . cloNIDine (CATAPRES) 0.1 MG tablet Take 1 tablet (0.1 mg total) by mouth 2 (two) times daily. 60 tablet 11  . cloNIDine (CATAPRES) 0.2 MG tablet Take 0.2 mg by mouth 2 (two) times daily.    Marland Kitchen LOKELMA 10 g PACK packet Take 1 packet by mouth 3 (three) times a week. Monday, Wednesday, and Friday    . multivitamin (RENA-VIT) TABS tablet Take 1 tablet by mouth daily.    . furosemide (LASIX) 80 MG tablet TAKE 1 TABLET (80 MG TOTAL) BY MOUTH 2 (TWO)  TIMES DAILY. (Patient not taking: No sig reported) 180 tablet 1  . labetalol (NORMODYNE) 300 MG tablet TAKE 1 TABLET (300 MG TOTAL) BY MOUTH 2 (TWO) TIMES DAILY. OFFICE VISIT NEEDED (Patient not taking: No sig reported) 60 tablet 0  . losartan (COZAAR) 50 MG tablet Take 1 tablet (50 mg total) by mouth daily. (Patient not taking: Reported on 05/10/2020) 90 tablet 1  . NIFEdipine (PROCARDIA XL/ADALAT-CC) 60 MG 24 hr tablet Take 60 mg by mouth 2 (two) times daily. (Patient not taking: Reported on 05/10/2020)    . omeprazole (PRILOSEC) 20 MG capsule TAKE 1 CAPSULE BY MOUTH EVERY DAY (Patient not taking: No  sig reported) 60 capsule 0  . pravastatin (PRAVACHOL) 80 MG tablet Take 1 tablet (80 mg total) by mouth at bedtime. (Patient not taking: No sig reported) 90 tablet 3  . sevelamer carbonate (RENVELA) 800 MG tablet Take 1 tablet (800 mg total) by mouth 3 (three) times daily with meals. (Patient not taking: No sig reported) 90 tablet 0  . tacrolimus (PROGRAF) 5 MG capsule Take 5 mg by mouth 2 (two) times daily. (Patient not taking: Reported on 05/10/2020)     No current facility-administered medications on file prior to visit.    Physical exam:  Vitals:   05/10/20 1240  BP: 118/80  Pulse: (!) 110  Resp: 20  Temp: 98.2 F (36.8 C)  SpO2: 97%  Weight: 185 lb (83.9 kg)    Extremities: 5 cm area of aneurysmal degeneration just above the antecubital crease left arm pulsatile in character  Neck: Right side dialysis catheter  Chest: Some collaterals along the left anterior chest wall  Data: Reviewed the patient's vein mapping from 2019 which shows a 5 mm left basilic vein and a 4 to 5 mm basilic and cephalic vein on the right side  Assessment: Patient needs long-term hemodialysis access.  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  Plan: 1.  Central venogram left side to see if he has adequate central veins to support and access on the left side.  This will be scheduled for tomorrow February 18.  Excision of aneurysm left antecubital region and then placement of a left basilic vein fistula versus left upper arm AV graft 05/21/2020.  Risk benefits possible complications of procedure details of AV graft discussed with the patient today include but not limited to bleeding infection graft phimosis.  He understands and agrees to proceed.  We will plan for general anesthesia as he will most laboratory require tourniquet to deal with aneurysm at his left antecubital area.  Ruta Hinds, MD [facility] [place] Office: [phone]

## 2020-05-17 ENCOUNTER — Other Ambulatory Visit (HOSPITAL_COMMUNITY): Payer: Medicare HMO

## 2020-05-18 ENCOUNTER — Ambulatory Visit (HOSPITAL_COMMUNITY): Admission: RE | Admit: 2020-05-18 | Payer: Medicare HMO | Source: Home / Self Care | Admitting: Vascular Surgery

## 2020-05-18 ENCOUNTER — Encounter (HOSPITAL_COMMUNITY): Admission: RE | Payer: Self-pay | Source: Home / Self Care

## 2020-05-18 SURGERY — UPPER EXTREMITY VENOGRAPHY
Anesthesia: LOCAL | Laterality: Left

## 2020-05-29 ENCOUNTER — Other Ambulatory Visit: Payer: Self-pay

## 2020-05-29 MED ORDER — SODIUM CHLORIDE 0.9 % IV SOLN: 250.0000 mL | INTRAVENOUS | Status: DC | PRN

## 2020-05-29 MED ORDER — SODIUM CHLORIDE 0.9% FLUSH
3.0000 mL | Freq: Two times a day (BID) | INTRAVENOUS | Status: DC
Start: 2020-05-29 — End: 2022-09-08

## 2020-05-29 MED ORDER — SODIUM CHLORIDE 0.9% FLUSH
3.0000 mL | INTRAVENOUS | Status: DC | PRN
Start: 2020-05-29 — End: 2022-09-08

## 2020-05-31 ENCOUNTER — Other Ambulatory Visit (HOSPITAL_COMMUNITY)
Admission: RE | Admit: 2020-05-31 | Discharge: 2020-05-31 | Disposition: A | Discharge status: Home / Self Care | Payer: Medicare HMO | Source: Ambulatory Visit | Attending: Vascular Surgery | Admitting: Vascular Surgery

## 2020-05-31 DIAGNOSIS — Z20822 Contact with and (suspected) exposure to covid-19: Secondary | ICD-10-CM | POA: Diagnosis not present

## 2020-05-31 DIAGNOSIS — Z01812 Encounter for preprocedural laboratory examination: Secondary | ICD-10-CM | POA: Insufficient documentation

## 2020-05-31 LAB — SARS CORONAVIRUS 2 (TAT 6-24 HRS): SARS Coronavirus 2: NEGATIVE

## 2020-06-01 ENCOUNTER — Encounter (HOSPITAL_COMMUNITY): Payer: Self-pay | Admitting: Vascular Surgery

## 2020-06-01 ENCOUNTER — Ambulatory Visit (HOSPITAL_COMMUNITY)
Admission: RE | Admit: 2020-06-01 | Discharge: 2020-06-01 | Disposition: A | Discharge status: Home / Self Care | Payer: Medicare HMO | Attending: Vascular Surgery | Admitting: Vascular Surgery

## 2020-06-01 ENCOUNTER — Encounter (HOSPITAL_COMMUNITY)
Admission: RE | Disposition: A | Discharge status: Home / Self Care | Payer: Self-pay | Source: Home / Self Care | Attending: Vascular Surgery

## 2020-06-01 ENCOUNTER — Other Ambulatory Visit: Payer: Self-pay

## 2020-06-01 ENCOUNTER — Other Ambulatory Visit (HOSPITAL_COMMUNITY): Payer: Medicare HMO

## 2020-06-01 DIAGNOSIS — Z79899 Other long term (current) drug therapy: Secondary | ICD-10-CM | POA: Insufficient documentation

## 2020-06-01 DIAGNOSIS — I120 Hypertensive chronic kidney disease with stage 5 chronic kidney disease or end stage renal disease: Secondary | ICD-10-CM | POA: Insufficient documentation

## 2020-06-01 DIAGNOSIS — Z992 Dependence on renal dialysis: Secondary | ICD-10-CM

## 2020-06-01 DIAGNOSIS — N186 End stage renal disease: Secondary | ICD-10-CM | POA: Insufficient documentation

## 2020-06-01 DIAGNOSIS — Q2731 Arteriovenous malformation of vessel of upper limb: Secondary | ICD-10-CM | POA: Diagnosis present

## 2020-06-01 DIAGNOSIS — T82898A Other specified complication of vascular prosthetic devices, implants and grafts, initial encounter: Secondary | ICD-10-CM | POA: Diagnosis not present

## 2020-06-01 HISTORY — PX: UPPER EXTREMITY VENOGRAPHY: CATH118272

## 2020-06-01 LAB — POCT I-STAT, CHEM 8
BUN: 75 mg/dL — ABNORMAL HIGH (ref 6–20)
Calcium, Ion: 1.1 mmol/L — ABNORMAL LOW (ref 1.15–1.40)
Chloride: 96 mmol/L — ABNORMAL LOW (ref 98–111)
Creatinine, Ser: 10.1 mg/dL — ABNORMAL HIGH (ref 0.61–1.24)
Glucose, Bld: 96 mg/dL (ref 70–99)
HCT: 46 % (ref 39.0–52.0)
Hemoglobin: 15.6 g/dL (ref 13.0–17.0)
Potassium: 5.7 mmol/L — ABNORMAL HIGH (ref 3.5–5.1)
Sodium: 135 mmol/L (ref 135–145)
TCO2: 29 mmol/L (ref 22–32)

## 2020-06-01 SURGERY — UPPER EXTREMITY VENOGRAPHY
Anesthesia: LOCAL | Laterality: Bilateral

## 2020-06-01 MED ORDER — CHLORHEXIDINE GLUCONATE 4 % EX LIQD
60.0000 mL | Freq: Once | CUTANEOUS | Status: DC
  Filled 2020-06-01: qty 60

## 2020-06-01 MED ORDER — SODIUM CHLORIDE 0.9 % IV SOLN: INTRAVENOUS | Status: DC

## 2020-06-01 MED ORDER — IODIXANOL 320 MG/ML IV SOLN
INTRAVENOUS | Status: DC | PRN
  Administered 2020-06-01: 50 mL

## 2020-06-01 MED ORDER — CEFAZOLIN SODIUM-DEXTROSE 2-4 GM/100ML-% IV SOLN: 2.0000 g | INTRAVENOUS | Status: DC

## 2020-06-01 SURGICAL SUPPLY — 2 items
STOPCOCK MORSE 400PSI 3WAY (MISCELLANEOUS) ×4 IMPLANT
TUBING CIL FLEX 10 FLL-RA (TUBING) ×2 IMPLANT

## 2020-06-01 NOTE — Progress Notes (Signed)
Unable to reach pt. Left message to return call and called another time that call went straight to voicemail. I called his daughter, Linus Salmons (DPR on file) and she was willing to take the pre-op instructions but stated she was not really familiar with her dad's medical history. States that pt is private and doesn't share the information with her.   Pt had a venogram today. He had a Covid test done yesterday and it was negative. I asked [place] to have pt to continue quarantine until Monday and may not need to have another Covid test done. She states she will tell him this.   Pre-op instructions given to [place] and she voiced understanding.

## 2020-06-01 NOTE — Discharge Instructions (Signed)
Venogram A venogram, or venography, is a procedure that uses an X-ray and dye (contrast) to examine how well the veins work and how blood flows through them. Contrast helps the veins show up on X-rays. A venogram may be done:  To evaluate abnormalities in the vein.  To identify clots within veins, such as deep vein thrombosis (DVT).  To map out the veins that might be needed for another procedure. Tell a health care provider about:  Any allergies you have, especially to medicines, shellfish, iodine, and contrast.  All medicines you are taking, including vitamins, herbs, eye drops, creams, and over-the-counter medicines.  Any problems you or family members have had with anesthetic medicines.  Any blood disorders you have.  Any surgeries you have had and any complications that occurred.  Any medical conditions you have.  Whether you are pregnant, may be pregnant, or are breastfeeding.  Any history of smoking or tobacco use. What are the risks? Generally, this is a safe procedure. However, problems may occur, including:  Infection.  Bleeding.  Blood clots.  Allergic reaction to medicines or contrast.  Damage to other structures or organs.  Kidney problems.  Increased risk of cancer. Being exposed to too much radiation over a lifetime can increase the risk of cancer. The risk is small. What happens before the procedure? Medicines Ask your health care provider about:  Changing or stopping your regular medicines. This is especially important if you are taking diabetes medicines or blood thinners.  Taking medicines such as aspirin and ibuprofen. These medicines can thin your blood. Do not take these medicines unless your health care provider tells you to take them.  Taking over-the-counter medicines, vitamins, herbs, and supplements. General instructions  Follow instructions from your health care provider about eating or drinking restrictions.  You may have blood tests  to check how well your kidneys and liver are working and how well your blood can clot.  Plan to have someone take you home from the hospital or clinic. What happens during the procedure?  An IV will be inserted into one of your veins.  You may be given a medicine to help you relax (sedative).  You will lie down on an X-ray table. The table may be tilted in different directions during the procedure to help the contrast move throughout your body. Safety straps will keep you secure if the table is tilted.  If veins in your arm or leg will be examined, a band may be wrapped around that arm or leg to keep the veins full of blood. This may cause your arm or leg to feel numb.  The contrast will be injected into your IV. You may have a hot, flushed feeling as it moves throughout your body. You may also have a metallic taste in your mouth. Both of these sensations will go away after the test is complete.  You may be asked to lie in different positions or place your legs or arms in different positions.  At the end of the procedure, you may be given IV fluids to help wash or flush the contrast out of your veins.  The IV will be removed, and pressure will be applied to the IV site to prevent bleeding. A bandage (dressing) may be applied to the IV site. The exact procedure may vary among health care providers and hospitals.   What can I expect after the procedure?  Your blood pressure, heart rate, breathing rate, and blood oxygen level will be monitored until   you leave the hospital or clinic.  You may be given something to eat and drink.  You may have bruising or mild discomfort in the area where the IV was inserted. Follow these instructions at home: Eating and drinking  Follow instructions from your health care provider about eating or drinking restrictions.  Drink a lot of water for the first several days after the procedure, as directed by your health care provider. This helps to flush the  contrast out of your body.   Activity  Rest as told by your health care provider.  Return to your normal activities as told by your health care provider. Ask your health care provider what activities are safe for you.  If you were given a sedative during your procedure, do not drive for 24 hours or until your health care provider approves. General instructions  Check your IV insertion area every day for signs of infection. Check for: ? Redness, swelling, or pain. ? Fluid or blood. ? Warmth. ? Pus or a bad smell.  Take over-the-counter and prescription medicines only as told by your health care provider.  Keep all follow-up visits as told by your health care provider. This is important. Contact a health care provider if:  Your skin becomes itchy or you develop a rash or hives.  You have a fever that does not get better with medicine.  You feel nauseous or you vomit.  You have redness, swelling, or pain around the insertion site.  You have fluid or blood coming from the insertion site.  Your insertion area feels warm to the touch.  You have pus or a bad smell coming from the insertion site. Get help right away if you:  Have shortness of breath or difficulty breathing.  Develop chest pain.  Faint.  Feel very dizzy. These symptoms may represent a serious problem that is an emergency. Do not wait to see if the symptoms will go away. Get medical help right away. Call your local emergency services (911 in the [place] Do not drive yourself to the hospital. Summary  A venogram, or venography, is a procedure that uses an X-ray and contrast dye to check how well the veins work and how blood flows through them.  An IV will be inserted into one of your veins in order to inject the contrast.  During the exam, you will lie on an X-ray table. The table may be tilted in different directions during the procedure to help the contrast move throughout your body. Safety straps will keep  you secure.  After the procedure, you will need to drink a lot of water to help wash or flush the contrast out of your body. This information is not intended to replace advice given to you by your health care provider. Make sure you discuss any questions you have with your health care provider. Document Revised: 10/16/2018 Document Reviewed: 10/16/2018 Elsevier Patient Education  2021 [employer]vier Inc.  

## 2020-06-01 NOTE — Op Note (Signed)
   PATIENT: Ryan [name]      MRN: [id number] DOB: 05-Mar-1961    DATE OF PROCEDURE: 06/01/2020  INDICATIONS:    Ryan [name] is a 60 y.o. 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  He also needs new access.  Bilateral central venogram was recommended by Dr. Oneida Alar in order to decide on the best option for new access.  PROCEDURE:    Bilateral central venogram  SURGEON: Judeth Cornfield. Scot Dock, MD, FACS  ANESTHESIA: None  EBL: None  TECHNIQUE: The patient was brought to the [facility] lab after IVs were placed in both forearms.  The patient was placed supine.  Contrast was injected through the IV in the left arm to evaluate the veins from the point of the IV up to include the central veins.  Next contrast was injected in the right arm from the point of cannulation up to include the central veins.  Patient tolerated the procedure well.  FINDINGS:   1.  On the left side, there is no central venous stenosis.  The brachial vein and basilic veins are widely patent. 2.  On the right side there is no significant central venous stenosis although the right IJ catheter may be compromising flow in the SVC slightly.  In the right arm the brachial vein, cephalic vein, and basilic veins are patent.  Axillary and subclavian veins are patent.  CLINICAL NOTE: The patient is scheduled for placement of new access and excision of the aneurysm in his left arm on Monday.  [name], MD, FACS [facility]  DATE OF DICTATION:   06/01/2020

## 2020-06-01 NOTE — Interval H&P Note (Signed)
History and Physical Interval Note:  06/01/2020 10:28 AM  Ryan [name]  has presented today for surgery, with the diagnosis of peripheral vascular disease.  The various methods of treatment have been discussed with the patient and family. After consideration of risks, benefits and other options for treatment, the patient has consented to  Procedure(s): UPPER EXTREMITY VENOGRAPHY (Bilateral) as a surgical intervention.  The patient's history has been reviewed, patient examined, no change in status, stable for surgery.  I have reviewed the patient's chart and labs.  Questions were answered to the patient's satisfaction.     [name]

## 2020-06-04 ENCOUNTER — Ambulatory Visit (HOSPITAL_COMMUNITY): Payer: Medicare HMO | Admitting: Certified Registered Nurse Anesthetist

## 2020-06-04 ENCOUNTER — Encounter (HOSPITAL_COMMUNITY): Payer: Self-pay | Admitting: Vascular Surgery

## 2020-06-04 ENCOUNTER — Ambulatory Visit (HOSPITAL_COMMUNITY)
Admission: RE | Admit: 2020-06-04 | Discharge: 2020-06-04 | Disposition: A | Discharge status: Home / Self Care | Payer: Medicare HMO | Attending: Vascular Surgery | Admitting: Vascular Surgery

## 2020-06-04 ENCOUNTER — Encounter (HOSPITAL_COMMUNITY)
Admission: RE | Disposition: A | Discharge status: Home / Self Care | Payer: Self-pay | Source: Home / Self Care | Attending: Vascular Surgery

## 2020-06-04 ENCOUNTER — Other Ambulatory Visit: Payer: Self-pay

## 2020-06-04 DIAGNOSIS — Z992 Dependence on renal dialysis: Secondary | ICD-10-CM | POA: Insufficient documentation

## 2020-06-04 DIAGNOSIS — Z538 Procedure and treatment not carried out for other reasons: Secondary | ICD-10-CM | POA: Insufficient documentation

## 2020-06-04 DIAGNOSIS — E875 Hyperkalemia: Secondary | ICD-10-CM | POA: Insufficient documentation

## 2020-06-04 DIAGNOSIS — N186 End stage renal disease: Secondary | ICD-10-CM | POA: Diagnosis present

## 2020-06-04 LAB — POCT I-STAT, CHEM 8
BUN: 114 mg/dL — ABNORMAL HIGH (ref 6–20)
Calcium, Ion: 1 mmol/L — ABNORMAL LOW (ref 1.15–1.40)
Chloride: 105 mmol/L (ref 98–111)
Creatinine, Ser: 13.5 mg/dL — ABNORMAL HIGH (ref 0.61–1.24)
Glucose, Bld: 99 mg/dL (ref 70–99)
HCT: 47 % (ref 39.0–52.0)
Hemoglobin: 16 g/dL (ref 13.0–17.0)
Potassium: 6.4 mmol/L (ref 3.5–5.1)
Sodium: 135 mmol/L (ref 135–145)
TCO2: 22 mmol/L (ref 22–32)

## 2020-06-04 LAB — BASIC METABOLIC PANEL
Anion gap: 21 — ABNORMAL HIGH (ref 5–15)
BUN: 102 mg/dL — ABNORMAL HIGH (ref 6–20)
CO2: 18 mmol/L — ABNORMAL LOW (ref 22–32)
Calcium: 9 mg/dL (ref 8.9–10.3)
Chloride: 97 mmol/L — ABNORMAL LOW (ref 98–111)
Creatinine, Ser: 13.2 mg/dL — ABNORMAL HIGH (ref 0.61–1.24)
GFR, Estimated: 4 mL/min — ABNORMAL LOW (ref >60)
Glucose, Bld: 93 mg/dL (ref 70–99)
Potassium: >7.5 mmol/L (ref 3.5–5.1)
Sodium: 136 mmol/L (ref 135–145)

## 2020-06-04 SURGERY — REVISON OF ARTERIOVENOUS FISTULA
Anesthesia: General | Laterality: Left

## 2020-06-04 MED ORDER — CHLORHEXIDINE GLUCONATE 0.12 % MT SOLN
OROMUCOSAL | Status: AC
  Administered 2020-06-04: 15 mL via OROMUCOSAL
  Filled 2020-06-04: qty 15

## 2020-06-04 MED ORDER — FENTANYL CITRATE (PF) 250 MCG/5ML IJ SOLN
INTRAMUSCULAR | Status: AC
  Filled 2020-06-04: qty 5

## 2020-06-04 MED ORDER — 0.9 % SODIUM CHLORIDE (POUR BTL) OPTIME: TOPICAL | Status: DC | PRN

## 2020-06-04 MED ORDER — SODIUM CHLORIDE 0.9 % IV SOLN: INTRAVENOUS | Status: DC

## 2020-06-04 MED ORDER — ORAL CARE MOUTH RINSE: 15.0000 mL | Freq: Once | OROMUCOSAL | Status: AC

## 2020-06-04 MED ORDER — ROCURONIUM BROMIDE 10 MG/ML (PF) SYRINGE
PREFILLED_SYRINGE | INTRAVENOUS | Status: AC
  Filled 2020-06-04: qty 10

## 2020-06-04 MED ORDER — SUCCINYLCHOLINE CHLORIDE 200 MG/10ML IV SOSY
PREFILLED_SYRINGE | INTRAVENOUS | Status: AC
  Filled 2020-06-04: qty 10

## 2020-06-04 MED ORDER — PROPOFOL 10 MG/ML IV BOLUS
INTRAVENOUS | Status: AC
  Filled 2020-06-04: qty 20

## 2020-06-04 MED ORDER — PHENYLEPHRINE HCL-NACL 10-0.9 MG/250ML-% IV SOLN
INTRAVENOUS | Status: AC
  Filled 2020-06-04: qty 250

## 2020-06-04 MED ORDER — SODIUM ZIRCONIUM CYCLOSILICATE 10 G PO PACK
10.0000 g | PACK | ORAL | Status: AC
  Administered 2020-06-04: 10 g via ORAL
  Filled 2020-06-04 (×2): qty 1

## 2020-06-04 MED ORDER — MIDAZOLAM HCL 2 MG/2ML IJ SOLN
INTRAMUSCULAR | Status: AC
  Filled 2020-06-04: qty 2

## 2020-06-04 MED ORDER — CHLORHEXIDINE GLUCONATE 0.12 % MT SOLN: 15.0000 mL | Freq: Once | OROMUCOSAL | Status: AC

## 2020-06-04 MED ORDER — LIDOCAINE 2% (20 MG/ML) 5 ML SYRINGE
INTRAMUSCULAR | Status: AC
  Filled 2020-06-04: qty 5

## 2020-06-04 MED ORDER — SODIUM CHLORIDE 0.9 % IV SOLN: INTRAVENOUS | Status: DC | PRN

## 2020-06-04 MED ORDER — SODIUM CHLORIDE 0.9 % IV SOLN
INTRAVENOUS | Status: AC
  Filled 2020-06-04: qty 1.2

## 2020-06-04 NOTE — Progress Notes (Signed)
CRITICAL RESULT PROVIDER NOTIFICATION  Test performed and critical result:  K+ 6.4  Date and time result received:  06/04/20 0723  Provider name/title: Dr. Calton Dach  Date and time provider notified: 06/04/20 0725  Date and time provider responded: 06/04/20 0725  Provider response:See new orders

## 2020-06-04 NOTE — Progress Notes (Addendum)
Pt discharged and given verbal instructions to go to [facility] at 12 noon for a dialysis session. Pt in no apparent distress or pain. Pt also given verbal instructions to contact Dr. Pershing Proud' office to reschedule his procedure. Pt escorted out to his daughter's car. The opportunity to ask questions was provided.

## 2020-06-04 NOTE — Progress Notes (Signed)
Patient has yet to show for procedure.  Secretary spoke with daughter at at 38 and she stated patient was on the way.   Multiple attempts to reach patient with no success.    Dr. Oneida Alar is aware.

## 2020-06-04 NOTE — Anesthesia Preprocedure Evaluation (Signed)
Anesthesia Evaluation  Patient identified by MRN, date of birth, ID band Patient awake    Reviewed: Allergy & Precautions, NPO status , Patient's Chart, lab work & pertinent test results  Airway Mallampati: II  TM Distance: >3 FB Neck ROM: Full    Dental no notable dental hx.    Pulmonary neg pulmonary ROS,    Pulmonary exam normal breath sounds clear to auscultation       Cardiovascular hypertension, +CHF  Normal cardiovascular exam Rhythm:Regular Rate:Normal     Neuro/Psych negative neurological ROS  negative psych ROS   GI/Hepatic negative GI ROS, Neg liver ROS,   Endo/Other  negative endocrine ROS  Renal/GU DialysisRenal disease  negative genitourinary   Musculoskeletal negative musculoskeletal ROS (+)   Abdominal   Peds negative pediatric ROS (+)  Hematology negative hematology ROS (+)   Anesthesia Other Findings   Reproductive/Obstetrics negative OB ROS                             Anesthesia Physical Anesthesia Plan  ASA: III  Anesthesia Plan: General   Post-op Pain Management:    Induction: Intravenous  PONV Risk Score and Plan: 2 and Ondansetron, Dexamethasone and Treatment may vary due to age or medical condition  Airway Management Planned: LMA  Additional Equipment:   Intra-op Plan:   Post-operative Plan: Extubation in OR  Informed Consent: I have reviewed the patients History and Physical, chart, labs and discussed the procedure including the risks, benefits and alternatives for the proposed anesthesia with the patient or authorized representative who has indicated his/her understanding and acceptance.     Dental advisory given  Plan Discussed with: CRNA and Surgeon  Anesthesia Plan Comments:         Anesthesia Quick Evaluation

## 2020-06-04 NOTE — Interval H&P Note (Signed)
History and Physical Interval Note:  06/04/2020 7:34 AM  Ryan [name]  has presented today for surgery, with the diagnosis of End Stage Renal Disease.  The various methods of treatment have been discussed with the patient and family. After consideration of risks, benefits and other options for treatment, the patient has consented to  Procedure(s): EXCISION OF LEFT ARM ARTERIOVENOUS FISTULA (Left) INSERTION OF ARTERIOVENOUS (AV) GORE-TEX GRAFT LEFT UPPER ARM (Left) as a surgical intervention.  The patient's history has been reviewed, patient examined, no change in status, stable for surgery.  I have reviewed the patient's chart and labs.  Questions were answered to the patient's satisfaction.    Discussed with pt that due to extent of aneurysm we will probably only deal with this today and return another day to placed his access.   His potassium was elevated on initial labwork and a repeat is pending.  He understands we may have to reschedule if potassium is too high.   Ruta Hinds

## 2020-06-04 NOTE — Progress Notes (Addendum)
Pt with hyperkalemia today.  Will reschedule case for March 25 or 28.  The preop holding area is going to contact his HD center regarding his hyperkalemia.  Ruta Hinds, MD [facility] [place] Office: [phone]

## 2020-06-04 NOTE — Progress Notes (Signed)
Paged by his OP HD unit this morning regarding hyperkalemia.Verbal orders to Terri Piedra to pass on to get EKG to assure no ST changes. If negative, give Lokelma 10g and send straight to his HD unit for HD. If + EKG changes, will need admission.  Veneta Penton, PA-C [employer] Pager [phone]

## 2020-06-04 NOTE — Progress Notes (Addendum)
Terri Piedra from social work advised that the pt needed a repeat EKG and medication prior to him being discharged. Awaiting a call back for further instructions.

## 2020-06-04 NOTE — Progress Notes (Signed)
Dr. Oneida Alar in the treatment area addressing the pt with information regarding his procedure being cancelled. Dr. Oneida Alar asked that the kidney center be contacted to inquire about him possibly getting dialyzed today instead of his regular Tues schedule. Spoke with Lavella Hammock, RN from [facility], and she was updated on the pt's current situation. She made him an appointment for today at 12 noon.

## 2020-06-04 NOTE — Progress Notes (Signed)
Renal Navigator received call from K. Stovall/Renal PA that patient is here for VVS procedure, which cannot be completed today due to K of 7.5. PA asked that Navigator follow up with the team caring for him to see if an EKG can be ordered and a 10g dose of Lokelma. She has spoken with outpatient HD clinic/[facility] and they will dialyze him on second shift, but PA wants to ensure these other two things have been done before he has HD as an outpatient. PA states patient should be admitted if EGK is abnormal.  Navigator contacted RN/A. Jenetta Loges who states she has spoken with outpatient HD clinic and received a time for HD of noon and immediately got EKG, which was normal and had Lokelma ordered for patient prior to his discharge. Navigator greatly appreciates RN's assistance. Navigator updated PA/Stovall.  Alphonzo Cruise, [facility]al Navigator [phone]

## 2020-06-04 NOTE — Progress Notes (Signed)
CRITICAL RESULT PROVIDER NOTIFICATION  Test performed and critical result: K+ greater than 7.5  Date and time result received:  06/04/20 [id number]  Provider name/title: Dr. Rodell Perna  Date and time provider notified: 06/04/20 0833  Date and time provider responded: 06/04/20 0833  Provider response:No new orders

## 2020-07-04 ENCOUNTER — Other Ambulatory Visit: Payer: Self-pay

## 2020-07-13 ENCOUNTER — Other Ambulatory Visit (HOSPITAL_COMMUNITY): Payer: Medicare HMO

## 2020-07-14 ENCOUNTER — Encounter (HOSPITAL_COMMUNITY): Payer: Self-pay | Admitting: Vascular Surgery

## 2020-07-14 NOTE — Progress Notes (Signed)
Instructions given to patient. He is to arrive 0750 and needs covid test prior to surgery. Npo after midnight.Denies sob,chest pain.

## 2020-07-16 ENCOUNTER — Encounter (HOSPITAL_COMMUNITY): Payer: Self-pay | Admitting: Vascular Surgery

## 2020-07-16 ENCOUNTER — Ambulatory Visit (HOSPITAL_COMMUNITY): Payer: Medicare HMO | Admitting: Certified Registered"

## 2020-07-16 ENCOUNTER — Encounter (HOSPITAL_COMMUNITY)
Admission: RE | Disposition: A | Discharge status: Home / Self Care | Payer: Self-pay | Source: Home / Self Care | Attending: Vascular Surgery

## 2020-07-16 ENCOUNTER — Ambulatory Visit (HOSPITAL_COMMUNITY)
Admission: RE | Admit: 2020-07-16 | Discharge: 2020-07-16 | Disposition: A | Discharge status: Home / Self Care | Payer: Medicare HMO | Attending: Vascular Surgery | Admitting: Vascular Surgery

## 2020-07-16 ENCOUNTER — Other Ambulatory Visit: Payer: Self-pay

## 2020-07-16 DIAGNOSIS — Z20822 Contact with and (suspected) exposure to covid-19: Secondary | ICD-10-CM | POA: Insufficient documentation

## 2020-07-16 DIAGNOSIS — Y841 Kidney dialysis as the cause of abnormal reaction of the patient, or of later complication, without mention of misadventure at the time of the procedure: Secondary | ICD-10-CM | POA: Insufficient documentation

## 2020-07-16 DIAGNOSIS — T82510A Breakdown (mechanical) of surgically created arteriovenous fistula, initial encounter: Secondary | ICD-10-CM | POA: Diagnosis present

## 2020-07-16 DIAGNOSIS — Z79899 Other long term (current) drug therapy: Secondary | ICD-10-CM | POA: Insufficient documentation

## 2020-07-16 DIAGNOSIS — I120 Hypertensive chronic kidney disease with stage 5 chronic kidney disease or end stage renal disease: Secondary | ICD-10-CM | POA: Diagnosis not present

## 2020-07-16 DIAGNOSIS — Z992 Dependence on renal dialysis: Secondary | ICD-10-CM | POA: Insufficient documentation

## 2020-07-16 DIAGNOSIS — N186 End stage renal disease: Secondary | ICD-10-CM | POA: Insufficient documentation

## 2020-07-16 DIAGNOSIS — T82898A Other specified complication of vascular prosthetic devices, implants and grafts, initial encounter: Secondary | ICD-10-CM

## 2020-07-16 HISTORY — DX: Family history of other specified conditions: Z84.89

## 2020-07-16 HISTORY — DX: Anemia, unspecified: D64.9

## 2020-07-16 HISTORY — PX: REVISON OF ARTERIOVENOUS FISTULA: SHX6074

## 2020-07-16 LAB — SARS CORONAVIRUS 2 BY RT PCR (HOSPITAL ORDER, PERFORMED IN ~~LOC~~ HOSPITAL LAB): SARS Coronavirus 2: NEGATIVE

## 2020-07-16 LAB — POCT I-STAT, CHEM 8
BUN: 63 mg/dL — ABNORMAL HIGH (ref 6–20)
Calcium, Ion: 1.06 mmol/L — ABNORMAL LOW (ref 1.15–1.40)
Chloride: 104 mmol/L (ref 98–111)
Creatinine, Ser: 14.3 mg/dL — ABNORMAL HIGH (ref 0.61–1.24)
Glucose, Bld: 90 mg/dL (ref 70–99)
HCT: 43 % (ref 39.0–52.0)
Hemoglobin: 14.6 g/dL (ref 13.0–17.0)
Potassium: 3.9 mmol/L (ref 3.5–5.1)
Sodium: 136 mmol/L (ref 135–145)
TCO2: 22 mmol/L (ref 22–32)

## 2020-07-16 SURGERY — REVISON OF ARTERIOVENOUS FISTULA
Anesthesia: General | Site: Arm Lower | Laterality: Left

## 2020-07-16 MED ORDER — OXYCODONE HCL 5 MG PO TABS: 5.0000 mg | ORAL_TABLET | Freq: Four times a day (QID) | ORAL | 0 refills | Status: DC | PRN

## 2020-07-16 MED ORDER — PHENYLEPHRINE 40 MCG/ML (10ML) SYRINGE FOR IV PUSH (FOR BLOOD PRESSURE SUPPORT)
PREFILLED_SYRINGE | INTRAVENOUS | Status: AC
  Filled 2020-07-16: qty 10

## 2020-07-16 MED ORDER — SODIUM CHLORIDE 0.9 % IV SOLN
INTRAVENOUS | Status: AC
  Filled 2020-07-16: qty 1.2

## 2020-07-16 MED ORDER — CEFAZOLIN SODIUM-DEXTROSE 2-4 GM/100ML-% IV SOLN
2.0000 g | INTRAVENOUS | Status: AC
  Administered 2020-07-16: 2 g via INTRAVENOUS
  Filled 2020-07-16: qty 100

## 2020-07-16 MED ORDER — LIDOCAINE 2% (20 MG/ML) 5 ML SYRINGE
INTRAMUSCULAR | Status: AC
  Filled 2020-07-16: qty 5

## 2020-07-16 MED ORDER — ONDANSETRON HCL 4 MG/2ML IJ SOLN
INTRAMUSCULAR | Status: DC | PRN
  Administered 2020-07-16: 4 mg via INTRAVENOUS

## 2020-07-16 MED ORDER — OXYCODONE HCL 5 MG/5ML PO SOLN: 5.0000 mg | Freq: Once | ORAL | Status: DC | PRN

## 2020-07-16 MED ORDER — CHLORHEXIDINE GLUCONATE 4 % EX LIQD: 60.0000 mL | Freq: Once | CUTANEOUS | Status: DC

## 2020-07-16 MED ORDER — HEPARIN SODIUM (PORCINE) 1000 UNIT/ML IJ SOLN
INTRAMUSCULAR | Status: DC | PRN
  Administered 2020-07-16: 5000 [IU] via INTRAVENOUS

## 2020-07-16 MED ORDER — LIDOCAINE 2% (20 MG/ML) 5 ML SYRINGE
INTRAMUSCULAR | Status: DC | PRN
  Administered 2020-07-16: 40 mg via INTRAVENOUS

## 2020-07-16 MED ORDER — DEXAMETHASONE SODIUM PHOSPHATE 10 MG/ML IJ SOLN
INTRAMUSCULAR | Status: DC | PRN
  Administered 2020-07-16: 10 mg via INTRAVENOUS

## 2020-07-16 MED ORDER — LIDOCAINE-EPINEPHRINE 1 %-1:100000 IJ SOLN
INTRAMUSCULAR | Status: AC
  Filled 2020-07-16: qty 1

## 2020-07-16 MED ORDER — FENTANYL CITRATE (PF) 100 MCG/2ML IJ SOLN
25.0000 ug | INTRAMUSCULAR | Status: DC | PRN
  Administered 2020-07-16: 25 ug via INTRAVENOUS

## 2020-07-16 MED ORDER — FENTANYL CITRATE (PF) 250 MCG/5ML IJ SOLN
INTRAMUSCULAR | Status: DC | PRN
  Administered 2020-07-16: 100 ug via INTRAVENOUS
  Administered 2020-07-16 (×2): 50 ug via INTRAVENOUS

## 2020-07-16 MED ORDER — ONDANSETRON HCL 4 MG/2ML IJ SOLN: 4.0000 mg | Freq: Four times a day (QID) | INTRAMUSCULAR | Status: DC | PRN

## 2020-07-16 MED ORDER — SODIUM CHLORIDE 0.9 % IV SOLN
INTRAVENOUS | Status: DC
  Administered 2020-07-16: 10 mL/h via INTRAVENOUS

## 2020-07-16 MED ORDER — HEMOSTATIC AGENTS (NO CHARGE) OPTIME
TOPICAL | Status: DC | PRN
  Administered 2020-07-16: 1 via TOPICAL

## 2020-07-16 MED ORDER — 0.9 % SODIUM CHLORIDE (POUR BTL) OPTIME
TOPICAL | Status: DC | PRN
  Administered 2020-07-16: 1000 mL

## 2020-07-16 MED ORDER — OXYCODONE HCL 5 MG PO TABS: 5.0000 mg | ORAL_TABLET | Freq: Once | ORAL | Status: DC | PRN

## 2020-07-16 MED ORDER — SUGAMMADEX SODIUM 200 MG/2ML IV SOLN
INTRAVENOUS | Status: DC | PRN
  Administered 2020-07-16: 200 mg via INTRAVENOUS

## 2020-07-16 MED ORDER — CHLORHEXIDINE GLUCONATE 0.12 % MT SOLN
15.0000 mL | Freq: Once | OROMUCOSAL | Status: AC
  Administered 2020-07-16: 15 mL via OROMUCOSAL
  Filled 2020-07-16 (×2): qty 15

## 2020-07-16 MED ORDER — MIDAZOLAM HCL 5 MG/5ML IJ SOLN
INTRAMUSCULAR | Status: DC | PRN
  Administered 2020-07-16 (×2): 1 mg via INTRAVENOUS

## 2020-07-16 MED ORDER — PROPOFOL 10 MG/ML IV BOLUS
INTRAVENOUS | Status: AC
  Filled 2020-07-16: qty 20

## 2020-07-16 MED ORDER — ROCURONIUM BROMIDE 100 MG/10ML IV SOLN
INTRAVENOUS | Status: DC | PRN
  Administered 2020-07-16: 30 mg via INTRAVENOUS

## 2020-07-16 MED ORDER — FENTANYL CITRATE (PF) 250 MCG/5ML IJ SOLN
INTRAMUSCULAR | Status: AC
  Filled 2020-07-16: qty 5

## 2020-07-16 MED ORDER — LIDOCAINE HCL 1 % IJ SOLN
INTRAMUSCULAR | Status: AC
  Filled 2020-07-16: qty 20

## 2020-07-16 MED ORDER — EPHEDRINE SULFATE 50 MG/ML IJ SOLN
INTRAMUSCULAR | Status: DC | PRN
  Administered 2020-07-16: 10 mg via INTRAVENOUS

## 2020-07-16 MED ORDER — PHENYLEPHRINE 40 MCG/ML (10ML) SYRINGE FOR IV PUSH (FOR BLOOD PRESSURE SUPPORT)
PREFILLED_SYRINGE | INTRAVENOUS | Status: DC | PRN
  Administered 2020-07-16: 120 ug via INTRAVENOUS
  Administered 2020-07-16: 80 ug via INTRAVENOUS

## 2020-07-16 MED ORDER — FENTANYL CITRATE (PF) 100 MCG/2ML IJ SOLN
INTRAMUSCULAR | Status: AC
  Filled 2020-07-16: qty 2

## 2020-07-16 MED ORDER — PHENYLEPHRINE HCL (PRESSORS) 10 MG/ML IV SOLN
INTRAVENOUS | Status: AC
  Filled 2020-07-16: qty 1

## 2020-07-16 MED ORDER — SUCCINYLCHOLINE CHLORIDE 200 MG/10ML IV SOSY
PREFILLED_SYRINGE | INTRAVENOUS | Status: DC | PRN
  Administered 2020-07-16: 120 mg via INTRAVENOUS

## 2020-07-16 MED ORDER — MIDAZOLAM HCL 2 MG/2ML IJ SOLN
INTRAMUSCULAR | Status: AC
  Filled 2020-07-16: qty 2

## 2020-07-16 MED ORDER — LIDOCAINE-EPINEPHRINE 0.5 %-1:200000 IJ SOLN
INTRAMUSCULAR | Status: AC
  Filled 2020-07-16: qty 1

## 2020-07-16 MED ORDER — PROTAMINE SULFATE 10 MG/ML IV SOLN
INTRAVENOUS | Status: DC | PRN
  Administered 2020-07-16: 50 mg via INTRAVENOUS

## 2020-07-16 MED ORDER — DEXMEDETOMIDINE (PRECEDEX) IN NS 20 MCG/5ML (4 MCG/ML) IV SYRINGE
PREFILLED_SYRINGE | INTRAVENOUS | Status: DC | PRN
  Administered 2020-07-16: 8 ug via INTRAVENOUS

## 2020-07-16 MED ORDER — PROPOFOL 10 MG/ML IV BOLUS
INTRAVENOUS | Status: DC | PRN
  Administered 2020-07-16: 170 mg via INTRAVENOUS

## 2020-07-16 MED ORDER — PHENYLEPHRINE HCL-NACL 10-0.9 MG/250ML-% IV SOLN
INTRAVENOUS | Status: DC | PRN
  Administered 2020-07-16: 35 ug/min via INTRAVENOUS

## 2020-07-16 MED ORDER — SODIUM CHLORIDE 0.9 % IV SOLN: INTRAVENOUS | Status: DC | PRN

## 2020-07-16 MED ORDER — DEXMEDETOMIDINE (PRECEDEX) IN NS 20 MCG/5ML (4 MCG/ML) IV SYRINGE
PREFILLED_SYRINGE | INTRAVENOUS | Status: AC
  Filled 2020-07-16: qty 5

## 2020-07-16 SURGICAL SUPPLY — 44 items
AGENT HMST SPONGE THK3/8 (HEMOSTASIS) ×1
ARMBAND PINK RESTRICT EXTREMIT (MISCELLANEOUS) ×3 IMPLANT
BNDG CMPR 9X4 STRL LF SNTH (GAUZE/BANDAGES/DRESSINGS) ×1
BNDG ELASTIC 4X5.8 VLCR STR LF (GAUZE/BANDAGES/DRESSINGS) ×3 IMPLANT
BNDG ESMARK 4X9 LF (GAUZE/BANDAGES/DRESSINGS) ×3 IMPLANT
BNDG GAUZE ELAST 4 BULKY (GAUZE/BANDAGES/DRESSINGS) ×3 IMPLANT
CANISTER SUCT 3000ML PPV (MISCELLANEOUS) ×3 IMPLANT
CANNULA VESSEL 3MM 2 BLNT TIP (CANNULA) ×3 IMPLANT
CLIP VESOCCLUDE MED 6/CT (CLIP) ×3 IMPLANT
CLIP VESOCCLUDE SM WIDE 6/CT (CLIP) ×3 IMPLANT
COVER PROBE W GEL 5X96 (DRAPES) ×3 IMPLANT
CUFF TOURN SGL QUICK 18X4 (TOURNIQUET CUFF) ×3 IMPLANT
DECANTER SPIKE VIAL GLASS SM (MISCELLANEOUS) IMPLANT
DERMABOND ADVANCED (GAUZE/BANDAGES/DRESSINGS) ×1
DERMABOND ADVANCED .7 DNX12 (GAUZE/BANDAGES/DRESSINGS) ×2 IMPLANT
DRAIN PENROSE 1/4X12 LTX STRL (WOUND CARE) ×3 IMPLANT
ELECT REM PT RETURN 9FT ADLT (ELECTROSURGICAL) ×3
ELECTRODE REM PT RTRN 9FT ADLT (ELECTROSURGICAL) ×2 IMPLANT
GAUZE SPONGE 4X4 12PLY STRL (GAUZE/BANDAGES/DRESSINGS) ×3 IMPLANT
GLOVE BIO SURGEON STRL SZ7.5 (GLOVE) ×3 IMPLANT
GLOVE SURG ENC MOIS LTX SZ6.5 (GLOVE) ×3 IMPLANT
GLOVE SURG UNDER POLY LF SZ6.5 (GLOVE) ×6 IMPLANT
GLOVE SURG UNDER POLY LF SZ7 (GLOVE) ×3 IMPLANT
GOWN STRL REUS W/ TWL LRG LVL3 (GOWN DISPOSABLE) ×6 IMPLANT
GOWN STRL REUS W/TWL LRG LVL3 (GOWN DISPOSABLE) ×9
HEMOSTAT SPONGE AVITENE ULTRA (HEMOSTASIS) ×3 IMPLANT
KIT BASIN OR (CUSTOM PROCEDURE TRAY) ×3 IMPLANT
KIT TURNOVER KIT B (KITS) ×3 IMPLANT
LOOP VESSEL MINI RED (MISCELLANEOUS) IMPLANT
NS IRRIG 1000ML POUR BTL (IV SOLUTION) ×6 IMPLANT
PACK CV ACCESS (CUSTOM PROCEDURE TRAY) ×3 IMPLANT
PAD ARMBOARD 7.5X6 YLW CONV (MISCELLANEOUS) ×6 IMPLANT
PATCH VASC XENOSURE 1CMX6CM (Vascular Products) ×3 IMPLANT
PATCH VASC XENOSURE 1X6 (Vascular Products) ×2 IMPLANT
SUT PROLENE 5 0 C 1 24 (SUTURE) ×6 IMPLANT
SUT PROLENE 6 0 CC (SUTURE) ×6 IMPLANT
SUT PROLENE 7 0 BV 1 (SUTURE) ×9 IMPLANT
SUT VIC AB 3-0 SH 27 (SUTURE) ×6
SUT VIC AB 3-0 SH 27X BRD (SUTURE) ×4 IMPLANT
SUT VICRYL 4-0 PS2 18IN ABS (SUTURE) ×6 IMPLANT
SYR TOOMEY 50ML (SYRINGE) IMPLANT
TOWEL GREEN STERILE (TOWEL DISPOSABLE) ×3 IMPLANT
UNDERPAD 30X36 HEAVY ABSORB (UNDERPADS AND DIAPERS) ×3 IMPLANT
WATER STERILE IRR 1000ML POUR (IV SOLUTION) ×3 IMPLANT

## 2020-07-16 NOTE — Op Note (Signed)
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  The patient was placed in supine position on the operating table.  Next the left upper extremities prepped and draped in usual sterile fashion.  A tourniquet was placed on the upper arm for vascular control if necessary.  Next a longitudinal incision was made on the left arm over the area of aneurysm and the incision extended from about 7 cm above the antecubital crease down to the antecubital crease.  The aneurysm was fully mobilized through this incision.  I was able to get above the area of aneurysm and dissect out the fistula circumferentially.  It was occluded above this.  I then proceeded to dissect out the aneurysm proximally and this extended all the way down and involved to the arterial anastomosis.  I was able to control the brachial artery proximal and distal to this.  The patient was given 5000 units of intravenous heparin.  The AV fistula was ligated above the aneurysm with a 2-0 silk tie.  It was then transected.  I then excised the entire aneurysm all the way down to the level brachial artery.  There was good inflow and outflow in the brachial artery.  After the aneurysmal segment had been fully debrided away a bovine pericardial patch was brought up in the operative field and sewn on as a patch angioplasty using a running 6-0 Prolene suture.  This prior to completion of the anastomosis it was for blood backbled and thoroughly flushed reanastomosed was secured  clamps released there is pulsatile flow in the brachial artery medially.  There was a good radial and ulnar Doppler flow.  A few repair sutures were placed in the anastomosis.  Hemostasis was obtained.  The subcutaneous tissues were reapproximated using running 3-0 Vicryl suture.  The skin was closed with a 4-0 Vicryl subcuticular stitch.  The patient tolerated procedure well and there were no complications.  The instrument sponge and needle count was correct in the case.  The patient was taken the recovery room stable condition.  Ruta Hinds, MD [facility] [place] Office: [phone]

## 2020-07-16 NOTE — Anesthesia Preprocedure Evaluation (Signed)
Anesthesia Evaluation  Patient identified by MRN, date of birth, ID band Patient awake    Reviewed: Allergy & Precautions, H&P , NPO status , Patient's Chart, lab work & pertinent test results  Airway Mallampati: II   Neck ROM: full    Dental   Pulmonary shortness of breath,    breath sounds clear to auscultation       Cardiovascular hypertension, + DOE   Rhythm:regular Rate:Normal     Neuro/Psych    GI/Hepatic   Endo/Other    Renal/GU ESRF and DialysisRenal disease     Musculoskeletal   Abdominal   Peds  Hematology   Anesthesia Other Findings   Reproductive/Obstetrics                             Anesthesia Physical Anesthesia Plan  ASA: III  Anesthesia Plan: General   Post-op Pain Management:    Induction: Intravenous  PONV Risk Score and Plan: 2 and Ondansetron, Propofol infusion, Midazolam and Treatment may vary due to age or medical condition  Airway Management Planned: LMA  Additional Equipment:   Intra-op Plan:   Post-operative Plan: Extubation in OR  Informed Consent: I have reviewed the patients History and Physical, chart, labs and discussed the procedure including the risks, benefits and alternatives for the proposed anesthesia with the patient or authorized representative who has indicated his/her understanding and acceptance.     Dental advisory given  Plan Discussed with: CRNA, Anesthesiologist and Surgeon  Anesthesia Plan Comments:         Anesthesia Quick Evaluation

## 2020-07-16 NOTE — Anesthesia Procedure Notes (Addendum)
Procedure Name: Intubation Date/Time: 07/16/2020 11:50 AM Performed by: Imagene Riches, CRNA Pre-anesthesia Checklist: Patient identified, Emergency Drugs available, Suction available and Patient being monitored Patient Re-evaluated:Patient Re-evaluated prior to induction Oxygen Delivery Method: Circle System Utilized Preoxygenation: Pre-oxygenation with 100% oxygen Induction Type: IV induction Ventilation: Mask ventilation without difficulty Laryngoscope Size: Mac and 4 Grade View: Grade II Tube type: Oral Tube size: 7.0 mm Number of attempts: 1 Airway Equipment and Method: Stylet and Oral airway Placement Confirmation: ETT inserted through vocal cords under direct vision,  positive ETCO2 and breath sounds checked- equal and bilateral Secured at: 22 cm Tube secured with: Tape Dental Injury: Teeth and Oropharynx as per pre-operative assessment  Comments: Performed by J'Woin Aline Brochure, SRNA

## 2020-07-16 NOTE — Transfer of Care (Signed)
Immediate Anesthesia Transfer of Care Note  Patient: Ryan [name]  Procedure(s) Performed: EXCISION OF LEFT ARM ARTERIOVENOUS FISTULA (Left Arm Lower)  Patient Location: PACU  Anesthesia Type:General  Level of Consciousness: drowsy  Airway & Oxygen Therapy: Patient Spontanous Breathing and Patient connected to face mask oxygen  Post-op Assessment: Report given to RN and Post -op Vital signs reviewed and stable  Post vital signs: Reviewed and stable  Last Vitals:  Vitals Value Taken Time  BP 123/80 07/16/20 1340  Temp    Pulse 93 07/16/20 1344  Resp 16 07/16/20 1344  SpO2 94 % 07/16/20 1344  Vitals shown include unvalidated device data.  Last Pain:  Vitals:   07/16/20 0830  TempSrc: Oral  PainSc:          Complications: No complications documented.

## 2020-07-16 NOTE — H&P (Signed)
Patient is a 60 year old male who returns for follow-up today.  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  He currently has a right-sided dialysis catheter.  In July [id number] he underwent plication of a segment of the fistula.  The fistula was subsequently ligated in September 2019.  He has been catheter dependent since then.  Last saw him in October 2019 and we discussed placing a radial cephalic fistula on the right side versus a new access in the left arm most likely a graft.  He would like to proceed with an AV graft in the left arm currently.  His current dialysis day is Tuesday Thursday Saturday.       Past Surgical History:  Procedure Laterality Date  . A/V FISTULAGRAM Left 12/15/2016   Procedure: A/V Fistulagram - left;  Surgeon: Angelia Mould, MD;  Location: [facility];  Service: Cardiovascular;  Laterality: Left;  . AV FISTULA PLACEMENT Left 08/28/2016   Procedure: LEFT UPPER  ARM ARTERIOVENOUS (AV) FISTULA CREATION;  Surgeon: Angelia Mould, MD;  Location: [facility];  Service: Vascular;  Laterality: Left;  . DIALYSIS/PERMA CATHETER INSERTION Right 12/21/2017   Procedure: INSERTION OF DIALYSIS CATHETER Right Internal Jugular .;  Surgeon: Elam Dutch, MD;  Location: [facility];  Service: Vascular;  Laterality: Right;  . FISTULA SUPERFICIALIZATION Left 09/23/2017   Procedure: FISTULA PLICATION LEFT ARM;  Surgeon: Elam Dutch, MD;  Location: [facility];  Service: Vascular;  Laterality: Left;  . FISTULOGRAM Left 12/21/2017   Procedure: FISTULOGRAM with Balloon Angioplasty.;  Surgeon: Elam Dutch, MD;  Location: [facility] OR;  Service: Vascular;  Laterality: Left;  . IR THORACENTESIS ASP PLEURAL SPACE W/IMG GUIDE  08/22/2017   1.2 L -right-sided  . IR THORACENTESIS ASP PLEURAL SPACE W/IMG GUIDE  10/19/2017  . REVISON OF ARTERIOVENOUS FISTULA Left [id number]   Procedure:  PLICATION and Ligation of F LEFT ARM ARTERIOVENOUS FISTULA;  Surgeon: Elam Dutch, MD;  Location: [facility] OR;  Service: Vascular;  Laterality: Left;  . stent in kidneys     dec 2017  . TRANSTHORACIC ECHOCARDIOGRAM  09/22/2017    Severe LVH.  Normal function -EF 55-60%.  GRII DD.  Moderate RV dilation with mildly reduced RV function.   Fixed right coronary cusp with very mild aortic stenosis.  The myocardium has a speckled appearance --> .   Recommend cardiac MRI to evaluate for amyloid         Past Medical History:  Diagnosis Date  . Allergy   . Blood transfusion without reported diagnosis   . Dialysis patient ([place])    Tues,thurs,sat  . ESRD (end stage renal disease) ([place])   . Hyperlipidemia   . Hypertension            Current Outpatient Medications on File Prior to Visit  Medication Sig Dispense Refill  . cinacalcet (SENSIPAR) 30 MG tablet Take 30 mg by mouth daily.  11  . cloNIDine (CATAPRES) 0.1 MG tablet Take 1 tablet (0.1 mg total) by mouth 2 (two) times daily. 60 tablet 11  . cloNIDine (CATAPRES) 0.2 MG tablet Take 0.2 mg by mouth 2 (two) times daily.    Marland Kitchen LOKELMA 10 g PACK packet Take 1 packet by mouth 3 (three) times a week. Monday, Wednesday, and Friday    . multivitamin (RENA-VIT) TABS tablet Take 1 tablet by mouth daily.    Marland Kitchen  furosemide (LASIX) 80 MG tablet TAKE 1 TABLET (80 MG TOTAL) BY MOUTH 2 (TWO) TIMES DAILY. (Patient not taking: No sig reported) 180 tablet 1  . labetalol (NORMODYNE) 300 MG tablet TAKE 1 TABLET (300 MG TOTAL) BY MOUTH 2 (TWO) TIMES DAILY. OFFICE VISIT NEEDED (Patient not taking: No sig reported) 60 tablet 0  . losartan (COZAAR) 50 MG tablet Take 1 tablet (50 mg total) by mouth daily. (Patient not taking: Reported on 05/10/2020) 90 tablet 1  . NIFEdipine (PROCARDIA XL/ADALAT-CC) 60 MG 24 hr tablet Take 60 mg by mouth 2 (two) times daily. (Patient not taking: Reported on 05/10/2020)    . omeprazole (PRILOSEC) 20 MG capsule TAKE  1 CAPSULE BY MOUTH EVERY DAY (Patient not taking: No sig reported) 60 capsule 0  . pravastatin (PRAVACHOL) 80 MG tablet Take 1 tablet (80 mg total) by mouth at bedtime. (Patient not taking: No sig reported) 90 tablet 3  . sevelamer carbonate (RENVELA) 800 MG tablet Take 1 tablet (800 mg total) by mouth 3 (three) times daily with meals. (Patient not taking: No sig reported) 90 tablet 0  . tacrolimus (PROGRAF) 5 MG capsule Take 5 mg by mouth 2 (two) times daily. (Patient not taking: Reported on 05/10/2020)     No current facility-administered medications on file prior to visit.    Physical exam:   Vitals:   07/14/20 1153 07/16/20 0830  BP:  112/73  Pulse:  97  Resp:  17  Temp:  97.8 F (36.6 C)  TempSrc:  Oral  SpO2:  100%  Weight: 83.9 kg 83.9 kg  Height: '5\' 10"'$  (1.778 m) '5\' 10"'$  (1.778 m)    Extremities: 5 cm area of aneurysmal degeneration just above the antecubital crease left arm pulsatile in character  Neck: Right side dialysis catheter  Chest: Some collaterals along the left anterior chest wall  Data: Reviewed the patient's vein mapping from 2019 which shows a 5 mm left basilic vein and a 4 to 5 mm basilic and cephalic vein on the right side  Assessment: Patient needs long-term hemodialysis access.  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  Plan:  Excision of aneurysm left antecubital region  Risk benefits possible complications of procedure details of AV graft discussed with the patient today include but not limited to bleeding infection.  He understands and agrees to proceed.  We will plan for general anesthesia as he will most laboratory require tourniquet to deal with aneurysm at his left antecubital area.  We will defer placing a new access until after the left arm has healed  Ruta Hinds, MD [facility] Office: [phone]

## 2020-07-17 ENCOUNTER — Encounter (HOSPITAL_COMMUNITY): Payer: Self-pay | Admitting: Vascular Surgery

## 2020-07-17 NOTE — Anesthesia Postprocedure Evaluation (Signed)
Anesthesia Post Note  Patient: Ryan [name]  Procedure(s) Performed: EXCISION OF LEFT ARM ARTERIOVENOUS FISTULA (Left Arm Lower)     Patient location during evaluation: PACU Anesthesia Type: General Level of consciousness: awake and alert Pain management: pain level controlled Vital Signs Assessment: post-procedure vital signs reviewed and stable Respiratory status: spontaneous breathing, nonlabored ventilation, respiratory function stable and patient connected to nasal cannula oxygen Cardiovascular status: blood pressure returned to baseline and stable Postop Assessment: no apparent nausea or vomiting Anesthetic complications: no   No complications documented.  Last Vitals:  Vitals:   07/16/20 1355 07/16/20 1410  BP: 123/85 114/75  Pulse:  91  Resp: 17 16  Temp:  36.4 C  SpO2: 92% 95%    Last Pain:  Vitals:   07/16/20 1410  TempSrc:   PainSc: 0-No pain                 Darold Miley S

## 2020-07-26 ENCOUNTER — Telehealth: Payer: Self-pay

## 2020-07-26 NOTE — Telephone Encounter (Signed)
Ryan [name] from HD called to report that patient's arm has a "possible hematoma" that is the size of a baseball. Called patient, says it has been that way since surgery, but has gotten bigger - does not sound like this was sudden. He has an appointment next week. Advised patient to go to hospital if area swelled suddenly or fingers went cold. Knows to call back if further issues prior to appointment.

## 2020-08-08 ENCOUNTER — Ambulatory Visit (INDEPENDENT_AMBULATORY_CARE_PROVIDER_SITE_OTHER): Payer: Medicare HMO | Admitting: Physician Assistant

## 2020-08-08 ENCOUNTER — Other Ambulatory Visit: Payer: Self-pay

## 2020-08-08 VITALS — BP 140/86 | HR 88 | Temp 98.2°F | Resp 20 | Ht 70.0 in | Wt 191.2 lb

## 2020-08-08 DIAGNOSIS — Z992 Dependence on renal dialysis: Secondary | ICD-10-CM

## 2020-08-08 DIAGNOSIS — N186 End stage renal disease: Secondary | ICD-10-CM

## 2020-08-08 NOTE — Progress Notes (Signed)
    Postoperative Access Visit   History of Present Illness   Ryan [name] is a 60 y.o. 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 Oneida Alar. The patient's wounds are healing well. He does have a large area of swelling around the incision. He says this has been present since his surgery with slight increase in size. Denies any pain or tenderness. The patient notes no steal symptoms.  No fever or chills  He is dialyzing via a right IJ TDC on Tues/ Thurs/ Sat Physical Examination   Vitals:   08/08/20 0843  BP: 140/86  Pulse: 88  Resp: 20  Temp: 98.2 F (36.8 C)  TempSrc: Temporal  SpO2: 98%  Weight: 191 lb 3.2 oz (86.7 kg)  Height: '5\' 10"'$  (1.778 m)   Body mass index is 27.43 kg/m.  left arm Incision is almost healed, 2+ radial pulse, hand grip is 55, sensation in digits is intact, large fluid collection around incision the size of a baseball, soft, no tenderness, no warmth, no erythema     ACE bandage applied with light compression     Medical Decision Making   Ryan [name] is a 60 y.o. 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 Oneida Alar. He has what appears to be a seroma around his incision. No signs of infection. Discussed taking him to OR to drain this vs watching it. I applied a light compression dressing with ACE bandage and have encouraged him to elevate his arm. I will have him return in 2 weeks for follow up. Instructed him to call for earlier follow up should he develop redness, warmth, drainage, pain, fever or chills. He will need new dialysis access. It was previously discussed with patient and Dr. Oneida Alar to have a new left AV graft placed but this will be scheduled once his left upper arm is fully healed.   [name],  PA-C [facility] Office: [phone]  Clinic MD: Scot Dock

## 2020-08-22 ENCOUNTER — Ambulatory Visit: Payer: Medicare HMO

## 2020-08-23 ENCOUNTER — Encounter: Payer: Self-pay | Admitting: Vascular Surgery

## 2020-08-23 ENCOUNTER — Other Ambulatory Visit: Payer: Self-pay

## 2020-08-23 ENCOUNTER — Ambulatory Visit: Payer: Medicare HMO | Admitting: Vascular Surgery

## 2020-08-23 ENCOUNTER — Ambulatory Visit (INDEPENDENT_AMBULATORY_CARE_PROVIDER_SITE_OTHER): Payer: Medicare HMO | Admitting: Vascular Surgery

## 2020-08-23 VITALS — BP 126/80 | HR 107 | Temp 97.9°F | Resp 20 | Ht 70.0 in | Wt 190.0 lb

## 2020-08-23 DIAGNOSIS — Z992 Dependence on renal dialysis: Secondary | ICD-10-CM

## 2020-08-23 DIAGNOSIS — N186 End stage renal disease: Secondary | ICD-10-CM

## 2020-08-23 MED ORDER — CEPHALEXIN 500 MG PO CAPS: 500.0000 mg | ORAL_CAPSULE | Freq: Two times a day (BID) | ORAL | 0 refills | Status: DC

## 2020-08-23 NOTE — Progress Notes (Signed)
Patient is a 60 year old male who returns for follow-up today.  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  He has now developed a mass at the area of oversewing of his AV fistula near the antecubital space.  He has some clear drainage and erythema in this area.  He has not had any fever or chills.  He states his dialysis catheter is currently functioning.  Our plan is to place a new long-term hemodialysis access once this is healed.  Physical exam:  Vitals:   08/23/20 1030  BP: 126/80  Pulse: (!) 107  Resp: 20  Temp: 97.9 F (36.6 C)  SpO2: 94%  Weight: 190 lb (86.2 kg)  Height: '5\' 10"'$  (1.778 m)    Left upper extremity 5 x 5 cm mass nonpulsatile fluctuant near the antecubital crease with some erythema some clear drainage  Assessment: Seroma left antecubital area status post excision of aneurysmal degenerated fistula  Plan: I&D of this in the operating room with general anesthesia on Monday, August 27, 2020.  Risk benefits possible complications procedure details discussed with the patient today including but not limited to wound problems, recurrent seroma, infection.  Ruta Hinds, MD [facility] Office: [phone]

## 2020-08-23 NOTE — H&P (View-Only) (Signed)
Patient is a 60 year old male who returns for follow-up today.  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  He has now developed a mass at the area of oversewing of his AV fistula near the antecubital space.  He has some clear drainage and erythema in this area.  He has not had any fever or chills.  He states his dialysis catheter is currently functioning.  Our plan is to place a new long-term hemodialysis access once this is healed.  Physical exam:  Vitals:   08/23/20 1030  BP: 126/80  Pulse: (!) 107  Resp: 20  Temp: 97.9 F (36.6 C)  SpO2: 94%  Weight: 190 lb (86.2 kg)  Height: '5\' 10"'$  (1.778 m)    Left upper extremity 5 x 5 cm mass nonpulsatile fluctuant near the antecubital crease with some erythema some clear drainage  Assessment: Seroma left antecubital area status post excision of aneurysmal degenerated fistula  Plan: I&D of this in the operating room with general anesthesia on Monday, August 27, 2020.  Risk benefits possible complications procedure details discussed with the patient today including but not limited to wound problems, recurrent seroma, infection.  Ruta Hinds, MD [facility] Office: [phone]

## 2020-08-24 ENCOUNTER — Other Ambulatory Visit: Payer: Self-pay

## 2020-08-24 ENCOUNTER — Telehealth: Payer: Self-pay

## 2020-08-24 ENCOUNTER — Encounter (HOSPITAL_COMMUNITY): Payer: Self-pay | Admitting: Vascular Surgery

## 2020-08-24 NOTE — Telephone Encounter (Signed)
Spoke with pts daughter [place] to update on arrival time change to 0530 AM for surgery on 08/27/20 with Dr. Oneida Alar. Daughter verbalized understanding.

## 2020-08-24 NOTE — Progress Notes (Signed)
I called and left messages on Ryan [name]'s cell phone, I left voice messages, I did not get a return call. I called Ryan [name]- patient's daughter and designated person to speak with.  Ryan [name] reports that Ryan [name] phone is off. Ryan [name] reports that Ryan [name] has not complained of shortness of breath or chest pain. Ryan [name] reports that Ryan [name], nor anyone in his home has any s/s of Covid and to their knowledge of any exposures with anyone who does .

## 2020-08-27 ENCOUNTER — Other Ambulatory Visit: Payer: Self-pay

## 2020-08-27 ENCOUNTER — Ambulatory Visit (HOSPITAL_COMMUNITY): Payer: Medicare HMO | Admitting: Anesthesiology

## 2020-08-27 ENCOUNTER — Encounter (HOSPITAL_COMMUNITY)
Admission: RE | Disposition: A | Discharge status: Home / Self Care | Payer: Self-pay | Source: Home / Self Care | Attending: Vascular Surgery

## 2020-08-27 ENCOUNTER — Ambulatory Visit (HOSPITAL_COMMUNITY)
Admission: RE | Admit: 2020-08-27 | Discharge: 2020-08-27 | Disposition: A | Discharge status: Home / Self Care | Payer: Medicare HMO | Attending: Vascular Surgery | Admitting: Vascular Surgery

## 2020-08-27 ENCOUNTER — Encounter (HOSPITAL_COMMUNITY): Payer: Self-pay | Admitting: Vascular Surgery

## 2020-08-27 DIAGNOSIS — Y838 Other surgical procedures as the cause of abnormal reaction of the patient, or of later complication, without mention of misadventure at the time of the procedure: Secondary | ICD-10-CM | POA: Insufficient documentation

## 2020-08-27 DIAGNOSIS — L7622 Postprocedural hemorrhage and hematoma of skin and subcutaneous tissue following other procedure: Secondary | ICD-10-CM | POA: Diagnosis not present

## 2020-08-27 DIAGNOSIS — I97638 Postprocedural hematoma of a circulatory system organ or structure following other circulatory system procedure: Secondary | ICD-10-CM | POA: Diagnosis not present

## 2020-08-27 DIAGNOSIS — Z992 Dependence on renal dialysis: Secondary | ICD-10-CM | POA: Insufficient documentation

## 2020-08-27 DIAGNOSIS — L7632 Postprocedural hematoma of skin and subcutaneous tissue following other procedure: Secondary | ICD-10-CM | POA: Diagnosis present

## 2020-08-27 DIAGNOSIS — I120 Hypertensive chronic kidney disease with stage 5 chronic kidney disease or end stage renal disease: Secondary | ICD-10-CM | POA: Diagnosis not present

## 2020-08-27 DIAGNOSIS — N186 End stage renal disease: Secondary | ICD-10-CM | POA: Insufficient documentation

## 2020-08-27 HISTORY — PX: HEMATOMA EVACUATION: SHX5118

## 2020-08-27 LAB — POCT I-STAT, CHEM 8
BUN: 52 mg/dL — ABNORMAL HIGH (ref 6–20)
Calcium, Ion: 1.07 mmol/L — ABNORMAL LOW (ref 1.15–1.40)
Chloride: 103 mmol/L (ref 98–111)
Creatinine, Ser: 12.3 mg/dL — ABNORMAL HIGH (ref 0.61–1.24)
Glucose, Bld: 89 mg/dL (ref 70–99)
HCT: 38 % — ABNORMAL LOW (ref 39.0–52.0)
Hemoglobin: 12.9 g/dL — ABNORMAL LOW (ref 13.0–17.0)
Potassium: 5.3 mmol/L — ABNORMAL HIGH (ref 3.5–5.1)
Sodium: 139 mmol/L (ref 135–145)
TCO2: 28 mmol/L (ref 22–32)

## 2020-08-27 SURGERY — EVACUATION HEMATOMA
Anesthesia: Monitor Anesthesia Care | Site: Arm Upper | Laterality: Left

## 2020-08-27 MED ORDER — PROPOFOL 10 MG/ML IV BOLUS
INTRAVENOUS | Status: AC
  Filled 2020-08-27: qty 20

## 2020-08-27 MED ORDER — OXYCODONE HCL 5 MG PO TABS: 5.0000 mg | ORAL_TABLET | Freq: Once | ORAL | Status: AC | PRN

## 2020-08-27 MED ORDER — CHLORHEXIDINE GLUCONATE 4 % EX LIQD: 60.0000 mL | Freq: Once | CUTANEOUS | Status: DC

## 2020-08-27 MED ORDER — CHLORHEXIDINE GLUCONATE 0.12 % MT SOLN
OROMUCOSAL | Status: AC
  Administered 2020-08-27: 15 mL
  Filled 2020-08-27: qty 15

## 2020-08-27 MED ORDER — OXYCODONE HCL 5 MG/5ML PO SOLN: 5.0000 mg | Freq: Once | ORAL | Status: AC | PRN

## 2020-08-27 MED ORDER — OXYCODONE HCL 5 MG PO TABS
ORAL_TABLET | ORAL | Status: AC
  Administered 2020-08-27: 5 mg via ORAL
  Filled 2020-08-27: qty 1

## 2020-08-27 MED ORDER — PROPOFOL 10 MG/ML IV BOLUS
INTRAVENOUS | Status: DC | PRN
  Administered 2020-08-27: 200 mg via INTRAVENOUS

## 2020-08-27 MED ORDER — PROTAMINE SULFATE 10 MG/ML IV SOLN
INTRAVENOUS | Status: AC
  Filled 2020-08-27: qty 25

## 2020-08-27 MED ORDER — MIDAZOLAM HCL 5 MG/5ML IJ SOLN
INTRAMUSCULAR | Status: DC | PRN
  Administered 2020-08-27: 2 mg via INTRAVENOUS

## 2020-08-27 MED ORDER — ONDANSETRON HCL 4 MG/2ML IJ SOLN
INTRAMUSCULAR | Status: DC | PRN
  Administered 2020-08-27: 4 mg via INTRAVENOUS

## 2020-08-27 MED ORDER — FENTANYL CITRATE (PF) 100 MCG/2ML IJ SOLN
INTRAMUSCULAR | Status: AC
  Administered 2020-08-27: 50 ug via INTRAVENOUS
  Filled 2020-08-27: qty 2

## 2020-08-27 MED ORDER — FENTANYL CITRATE (PF) 250 MCG/5ML IJ SOLN
INTRAMUSCULAR | Status: AC
  Filled 2020-08-27: qty 5

## 2020-08-27 MED ORDER — FENTANYL CITRATE (PF) 100 MCG/2ML IJ SOLN
INTRAMUSCULAR | Status: DC | PRN
  Administered 2020-08-27: 25 ug via INTRAVENOUS
  Administered 2020-08-27: 50 ug via INTRAVENOUS
  Administered 2020-08-27 (×3): 25 ug via INTRAVENOUS

## 2020-08-27 MED ORDER — PHENYLEPHRINE 40 MCG/ML (10ML) SYRINGE FOR IV PUSH (FOR BLOOD PRESSURE SUPPORT)
PREFILLED_SYRINGE | INTRAVENOUS | Status: DC | PRN
  Administered 2020-08-27 (×4): 80 ug via INTRAVENOUS

## 2020-08-27 MED ORDER — 0.9 % SODIUM CHLORIDE (POUR BTL) OPTIME
TOPICAL | Status: DC | PRN
  Administered 2020-08-27: 1000 mL

## 2020-08-27 MED ORDER — CEFAZOLIN SODIUM-DEXTROSE 2-4 GM/100ML-% IV SOLN
2.0000 g | INTRAVENOUS | Status: AC
  Administered 2020-08-27: 2 g via INTRAVENOUS
  Filled 2020-08-27: qty 100

## 2020-08-27 MED ORDER — ACETAMINOPHEN 325 MG PO TABS
650.0000 mg | ORAL_TABLET | Freq: Once | ORAL | Status: AC
  Administered 2020-08-27: 650 mg via ORAL
  Filled 2020-08-27: qty 2

## 2020-08-27 MED ORDER — CHLORHEXIDINE GLUCONATE 4 % EX LIQD
60.0000 mL | Freq: Once | CUTANEOUS | Status: DC
Start: 2020-08-28 — End: 2020-08-27

## 2020-08-27 MED ORDER — OXYCODONE HCL 5 MG PO TABS: 5.0000 mg | ORAL_TABLET | ORAL | 0 refills | Status: DC | PRN

## 2020-08-27 MED ORDER — EPHEDRINE SULFATE-NACL 50-0.9 MG/10ML-% IV SOSY
PREFILLED_SYRINGE | INTRAVENOUS | Status: DC | PRN
  Administered 2020-08-27: 10 mg via INTRAVENOUS

## 2020-08-27 MED ORDER — SODIUM CHLORIDE 0.9 % IV SOLN: INTRAVENOUS | Status: DC

## 2020-08-27 MED ORDER — FENTANYL CITRATE (PF) 100 MCG/2ML IJ SOLN: 25.0000 ug | INTRAMUSCULAR | Status: DC | PRN

## 2020-08-27 MED ORDER — PROMETHAZINE HCL 25 MG/ML IJ SOLN: 6.2500 mg | INTRAMUSCULAR | Status: DC | PRN

## 2020-08-27 MED ORDER — LIDOCAINE 2% (20 MG/ML) 5 ML SYRINGE
INTRAMUSCULAR | Status: DC | PRN
  Administered 2020-08-27: 60 mg via INTRAVENOUS

## 2020-08-27 MED ORDER — MIDAZOLAM HCL 2 MG/2ML IJ SOLN
INTRAMUSCULAR | Status: AC
  Filled 2020-08-27: qty 2

## 2020-08-27 MED ORDER — CEPHALEXIN 500 MG PO CAPS: 500.0000 mg | ORAL_CAPSULE | Freq: Two times a day (BID) | ORAL | 0 refills | Status: DC

## 2020-08-27 SURGICAL SUPPLY — 36 items
BNDG ELASTIC 4X5.8 VLCR STR LF (GAUZE/BANDAGES/DRESSINGS) ×6 IMPLANT
BNDG ELASTIC 6X5.8 VLCR STR LF (GAUZE/BANDAGES/DRESSINGS) IMPLANT
BNDG GAUZE ELAST 4 BULKY (GAUZE/BANDAGES/DRESSINGS) ×3 IMPLANT
CANISTER SUCT 3000ML PPV (MISCELLANEOUS) ×3 IMPLANT
COVER SURGICAL LIGHT HANDLE (MISCELLANEOUS) ×3 IMPLANT
DRSG ADAPTIC 3X8 NADH LF (GAUZE/BANDAGES/DRESSINGS) ×3 IMPLANT
ELECT REM PT RETURN 9FT ADLT (ELECTROSURGICAL) ×3
ELECTRODE REM PT RTRN 9FT ADLT (ELECTROSURGICAL) ×2 IMPLANT
GAUZE 4X4 16PLY RFD (DISPOSABLE) ×3 IMPLANT
GAUZE SPONGE 4X4 12PLY STRL (GAUZE/BANDAGES/DRESSINGS) ×3 IMPLANT
GAUZE XEROFORM 5X9 LF (GAUZE/BANDAGES/DRESSINGS) ×3 IMPLANT
GLOVE BIO SURGEON STRL SZ7.5 (GLOVE) ×3 IMPLANT
GLOVE SURG UNDER POLY LF SZ6.5 (GLOVE) ×9 IMPLANT
GLOVE SURG UNDER POLY LF SZ7.5 (GLOVE) ×3 IMPLANT
GOWN STRL REUS W/ TWL LRG LVL3 (GOWN DISPOSABLE) ×6 IMPLANT
GOWN STRL REUS W/TWL LRG LVL3 (GOWN DISPOSABLE) ×9
KIT BASIN OR (CUSTOM PROCEDURE TRAY) ×3 IMPLANT
KIT TURNOVER KIT B (KITS) ×3 IMPLANT
NS IRRIG 1000ML POUR BTL (IV SOLUTION) ×3 IMPLANT
PACK GENERAL/GYN (CUSTOM PROCEDURE TRAY) ×3 IMPLANT
PACK UNIVERSAL I (CUSTOM PROCEDURE TRAY) ×3 IMPLANT
PAD ARMBOARD 7.5X6 YLW CONV (MISCELLANEOUS) ×6 IMPLANT
STAPLER VISISTAT 35W (STAPLE) IMPLANT
STOCKINETTE 6  STRL (DRAPES) ×3
STOCKINETTE 6 STRL (DRAPES) ×2 IMPLANT
SUT ETHILON 2 0 FS 18 (SUTURE) ×3 IMPLANT
SUT ETHILON 2 0 PSLX (SUTURE) ×6 IMPLANT
SUT ETHILON 3 0 PS 1 (SUTURE) ×3 IMPLANT
SUT VIC AB 2-0 CTX 36 (SUTURE) IMPLANT
SUT VIC AB 2-0 SH 27 (SUTURE) ×3
SUT VIC AB 2-0 SH 27XBRD (SUTURE) ×2 IMPLANT
SUT VIC AB 3-0 SH 27 (SUTURE)
SUT VIC AB 3-0 SH 27X BRD (SUTURE) IMPLANT
SUT VICRYL 4-0 PS2 18IN ABS (SUTURE) IMPLANT
TOWEL GREEN STERILE (TOWEL DISPOSABLE) ×3 IMPLANT
WATER STERILE IRR 1000ML POUR (IV SOLUTION) ×3 IMPLANT

## 2020-08-27 NOTE — Anesthesia Procedure Notes (Signed)
Procedure Name: Intubation Date/Time: 08/27/2020 7:50 AM Performed by: Griffin Dakin, CRNA Pre-anesthesia Checklist: Patient identified, Emergency Drugs available, Suction available and Patient being monitored Patient Re-evaluated:Patient Re-evaluated prior to induction Oxygen Delivery Method: Circle system utilized Preoxygenation: Pre-oxygenation with 100% oxygen Induction Type: IV induction Ventilation: Mask ventilation without difficulty LMA: LMA inserted LMA Size: 5.0 Number of attempts: 1 Placement Confirmation: positive ETCO2 and breath sounds checked- equal and bilateral Tube secured with: Tape Dental Injury: Teeth and Oropharynx as per pre-operative assessment

## 2020-08-27 NOTE — Anesthesia Preprocedure Evaluation (Addendum)
Anesthesia Evaluation  Patient identified by MRN, date of birth, ID band Patient awake    Reviewed: Allergy & Precautions, NPO status , Patient's Chart, lab work & pertinent test results  Airway Mallampati: II  TM Distance: >3 FB Neck ROM: Full    Dental no notable dental hx.    Pulmonary neg pulmonary ROS,    Pulmonary exam normal breath sounds clear to auscultation       Cardiovascular hypertension, Pt. on medications and Pt. on home beta blockers Normal cardiovascular exam Rhythm:Regular Rate:Normal  ECG: NSR, rate 86   Neuro/Psych negative neurological ROS  negative psych ROS   GI/Hepatic negative GI ROS, Neg liver ROS,   Endo/Other  negative endocrine ROS  Renal/GU ESRF and DialysisRenal diseaseOn HD T, R, Sat     Musculoskeletal negative musculoskeletal ROS (+)   Abdominal   Peds  Hematology HLD   Anesthesia Other Findings ESRD  Reproductive/Obstetrics                            Anesthesia Physical Anesthesia Plan  ASA: III  Anesthesia Plan: General   Post-op Pain Management:    Induction: Intravenous  PONV Risk Score and Plan: 2 and Ondansetron, Dexamethasone and Treatment may vary due to age or medical condition  Airway Management Planned: LMA  Additional Equipment:   Intra-op Plan:   Post-operative Plan: Extubation in OR  Informed Consent: I have reviewed the patients History and Physical, chart, labs and discussed the procedure including the risks, benefits and alternatives for the proposed anesthesia with the patient or authorized representative who has indicated his/her understanding and acceptance.     Dental advisory given  Plan Discussed with: CRNA  Anesthesia Plan Comments:       Anesthesia Quick Evaluation

## 2020-08-27 NOTE — Anesthesia Postprocedure Evaluation (Signed)
Anesthesia Post Note  Patient: Ryan [name]  Procedure(s) Performed: EVACUATION HEMATOMA LEFT ARM (Left Arm Upper)     Patient location during evaluation: PACU Anesthesia Type: General Level of consciousness: awake Pain management: pain level controlled Vital Signs Assessment: post-procedure vital signs reviewed and stable Respiratory status: spontaneous breathing, nonlabored ventilation, respiratory function stable and patient connected to nasal cannula oxygen Cardiovascular status: blood pressure returned to baseline and stable Postop Assessment: no apparent nausea or vomiting Anesthetic complications: no   No complications documented.  Last Vitals:  Vitals:   08/27/20 0930 08/27/20 0950  BP: (!) 151/92 (!) 152/86  Pulse: 98 91  Resp: 16 14  Temp:  (!) 36.1 C  SpO2: 93% 92%    Last Pain:  Vitals:   08/27/20 0935  TempSrc:   PainSc: 4                  Sidni Fusco P Annsley Akkerman

## 2020-08-27 NOTE — Transfer of Care (Signed)
Immediate Anesthesia Transfer of Care Note  Patient: Ryan [name]  Procedure(s) Performed: EVACUATION HEMATOMA LEFT ARM (Left Arm Upper)  Patient Location: PACU  Anesthesia Type:General  Level of Consciousness: awake, alert  and oriented  Airway & Oxygen Therapy: Patient Spontanous Breathing and Patient connected to nasal cannula oxygen  Post-op Assessment: Report given to RN and Post -op Vital signs reviewed and stable  Post vital signs: Reviewed and stable  Last Vitals:  Vitals Value Taken Time  BP 151/92 08/27/20 0930  Temp 36.1 C 08/27/20 0900  Pulse 99 08/27/20 0943  Resp 16 08/27/20 0943  SpO2 90 % 08/27/20 0943  Vitals shown include unvalidated device data.  Last Pain:  Vitals:   08/27/20 0935  TempSrc:   PainSc: 4          Complications: No complications documented.

## 2020-08-27 NOTE — Discharge Instructions (Signed)
Leave the dressing on for 48 hours(2 days) then you may remove it and change it with dry guaze daily.  Light ace wrap to keep pressure over the incision.

## 2020-08-27 NOTE — Op Note (Signed)
Procedure: Evacuation of hematoma right arm  Preoperative diagnosis: Right arm mass  Postoperative diagnosis: Right arm hematoma  Anesthesia: General  Assistant: Gerri Lins, PA-C  Operative findings: 5 x 5 cm hematoma left antecubital area no obvious ongoing bleeding  Operative details: After obtaining form consent, the patient was taken the operating.  The patient was placed in supine position operating table.  After induction general anesthesia placement of a laryngeal mask the patient's left upper extremities prepped and draped in usual sterile fashion.  The pre-existing scar in the antecubital area was reopened carried on through subcutaneous tissues and a hematoma cavity was entered.  This is about 5 x 5 cm in diameter.  There was a large amount of old blood and this was thoroughly evacuated and irrigated.  The wound was inspected and there was no obvious ongoing bleeding.  I then excised a few centimeters of the skin edges to cinch down the hematoma cavity.  The subcutaneous tissues were reapproximated using a running 2-0 Vicryl suture.  The skin was closed with multiple 2-0 nylon simple sutures.  Dry sterile dressing and Ace wrap were applied.  The patient tolerated procedure well and there were no complications.  The instrument sponge and needle count was correct the end of the case.  The patient was taken to the recovery room in stable condition  Ruta Hinds, MD [facility] Office: [phone]

## 2020-08-27 NOTE — Interval H&P Note (Signed)
History and Physical Interval Note:  08/27/2020 7:11 AM  Ryan [name]  has presented today for surgery, with the diagnosis of ESRD.  The various methods of treatment have been discussed with the patient and family. After consideration of risks, benefits and other options for treatment, the patient has consented to  Procedure(s): INCISION AND DRAINAGE OF LEFT ARM (Left) as a surgical intervention.  The patient's history has been reviewed, patient examined, no change in status, stable for surgery.  I have reviewed the patient's chart and labs.  Questions were answered to the patient's satisfaction.     Ruta Hinds

## 2020-08-28 ENCOUNTER — Encounter (HOSPITAL_COMMUNITY): Payer: Self-pay | Admitting: Vascular Surgery

## 2020-09-13 ENCOUNTER — Encounter: Payer: Medicare HMO | Admitting: Vascular Surgery

## 2020-09-13 ENCOUNTER — Ambulatory Visit (INDEPENDENT_AMBULATORY_CARE_PROVIDER_SITE_OTHER): Payer: Medicare HMO | Admitting: Vascular Surgery

## 2020-09-13 ENCOUNTER — Other Ambulatory Visit: Payer: Self-pay

## 2020-09-13 ENCOUNTER — Encounter: Payer: Self-pay | Admitting: Vascular Surgery

## 2020-09-13 VITALS — BP 102/68 | HR 115 | Temp 97.9°F | Resp 20 | Ht 70.0 in | Wt 187.0 lb

## 2020-09-13 DIAGNOSIS — N186 End stage renal disease: Secondary | ICD-10-CM

## 2020-09-13 DIAGNOSIS — Z992 Dependence on renal dialysis: Secondary | ICD-10-CM

## 2020-09-13 NOTE — Progress Notes (Signed)
Patient is a 60 year old male who returns for postoperative follow-up today.  He underwent ligation of an AV fistula on the left arm in 2019.  This subsequently became more aneurysmal.  He had excision of the left arm AV fistula on July 16, 2020.  The remainder of the AV fistula was excised completely on July 16, 2020.  The artery was repaired with a bovine pericardial patch.  Subsequently developed a hematoma in the left arm.  This was evacuated on August 27, 2020.  He returns today for further follow-up.  Physical exam:  Vitals:   09/13/20 1531  BP: 102/68  Pulse: (!) 115  Resp: 20  Temp: 97.9 F (36.6 C)  SpO2: 93%  Weight: 187 lb (84.8 kg)  Height: '5\' 10"'$  (1.778 m)    Left upper extremity healing antecubital incision with some slight separation.  Nylon suture still in place.  No erythema no drainage some recurrence of mass but nonpulsatile firm  Assessment: Healing left arm incision status post excision of AV fistula and evacuation of hematoma  Plan: Patient will follow up in 2 more weeks to recheck the wound in his left arm and remove sutures.  We will wait until his arm is completely healed before we consider a new access.  Ruta Hinds, MD [facility] Office: [phone]

## 2020-09-22 DIAGNOSIS — N186 End stage renal disease: Secondary | ICD-10-CM | POA: Diagnosis not present

## 2020-09-22 DIAGNOSIS — Z992 Dependence on renal dialysis: Secondary | ICD-10-CM | POA: Diagnosis not present

## 2020-09-22 DIAGNOSIS — N2581 Secondary hyperparathyroidism of renal origin: Secondary | ICD-10-CM | POA: Diagnosis not present

## 2020-09-25 DIAGNOSIS — Z992 Dependence on renal dialysis: Secondary | ICD-10-CM | POA: Diagnosis not present

## 2020-09-25 DIAGNOSIS — N186 End stage renal disease: Secondary | ICD-10-CM | POA: Diagnosis not present

## 2020-09-25 DIAGNOSIS — N2581 Secondary hyperparathyroidism of renal origin: Secondary | ICD-10-CM | POA: Diagnosis not present

## 2020-09-27 ENCOUNTER — Ambulatory Visit: Payer: Medicare HMO | Admitting: Vascular Surgery

## 2020-09-27 ENCOUNTER — Encounter: Payer: Self-pay | Admitting: Vascular Surgery

## 2020-09-27 ENCOUNTER — Other Ambulatory Visit: Payer: Self-pay

## 2020-09-27 VITALS — BP 100/67 | HR 110 | Resp 20 | Ht 70.0 in | Wt 187.0 lb

## 2020-09-27 DIAGNOSIS — N186 End stage renal disease: Secondary | ICD-10-CM

## 2020-09-27 DIAGNOSIS — Z992 Dependence on renal dialysis: Secondary | ICD-10-CM

## 2020-09-27 DIAGNOSIS — N2581 Secondary hyperparathyroidism of renal origin: Secondary | ICD-10-CM | POA: Diagnosis not present

## 2020-09-27 NOTE — Progress Notes (Signed)
Patient is a 60 year old male who returns for postoperative follow-up today.  He underwent ligation of an AV fistula in the left arm in 2019.  This subsequently became more aneurysmal he had excision of the left arm AV fistula on July 16, 2020.  This was repaired with a bovine pericardial patch.  He subsequently developed a hematoma in the left arm.  He returns today for further follow-up.  Physical exam:  Vitals:   09/27/20 1444  BP: 100/67  Pulse: (!) 110  Resp: 20  SpO2: 95%  Weight: 187 lb (84.8 kg)  Height: '5\' 10"'$  (1.778 m)    Left upper extremity healing antecubital incision still some slight skin separation no redness no drainage sutures intact  Assessment: Healing left upper extremity sutures removed today  Plan: Patient would prefer to stay in the left arm for his new access.  He is currently dialyzing via catheter.  Incision has not healed up enough in the left arm to proceed with an access yet.  He will follow-up with me in 4 to 6 weeks to consider a new left upper extremity access.  We will obtain a left upper extremity vein mapping at that appointment.  Ruta Hinds, MD [facility] [place] Office: [phone]

## 2020-09-28 ENCOUNTER — Other Ambulatory Visit: Payer: Self-pay

## 2020-09-28 DIAGNOSIS — N186 End stage renal disease: Secondary | ICD-10-CM

## 2020-09-29 DIAGNOSIS — N186 End stage renal disease: Secondary | ICD-10-CM | POA: Diagnosis not present

## 2020-09-29 DIAGNOSIS — N2581 Secondary hyperparathyroidism of renal origin: Secondary | ICD-10-CM | POA: Diagnosis not present

## 2020-09-29 DIAGNOSIS — Z992 Dependence on renal dialysis: Secondary | ICD-10-CM | POA: Diagnosis not present

## 2020-10-02 DIAGNOSIS — N2581 Secondary hyperparathyroidism of renal origin: Secondary | ICD-10-CM | POA: Diagnosis not present

## 2020-10-02 DIAGNOSIS — N186 End stage renal disease: Secondary | ICD-10-CM | POA: Diagnosis not present

## 2020-10-02 DIAGNOSIS — Z992 Dependence on renal dialysis: Secondary | ICD-10-CM | POA: Diagnosis not present

## 2020-10-04 DIAGNOSIS — N2581 Secondary hyperparathyroidism of renal origin: Secondary | ICD-10-CM | POA: Diagnosis not present

## 2020-10-04 DIAGNOSIS — N186 End stage renal disease: Secondary | ICD-10-CM | POA: Diagnosis not present

## 2020-10-04 DIAGNOSIS — Z992 Dependence on renal dialysis: Secondary | ICD-10-CM | POA: Diagnosis not present

## 2020-10-06 DIAGNOSIS — N2581 Secondary hyperparathyroidism of renal origin: Secondary | ICD-10-CM | POA: Diagnosis not present

## 2020-10-06 DIAGNOSIS — Z992 Dependence on renal dialysis: Secondary | ICD-10-CM | POA: Diagnosis not present

## 2020-10-06 DIAGNOSIS — N186 End stage renal disease: Secondary | ICD-10-CM | POA: Diagnosis not present

## 2020-10-09 DIAGNOSIS — N186 End stage renal disease: Secondary | ICD-10-CM | POA: Diagnosis not present

## 2020-10-09 DIAGNOSIS — N2581 Secondary hyperparathyroidism of renal origin: Secondary | ICD-10-CM | POA: Diagnosis not present

## 2020-10-09 DIAGNOSIS — Z992 Dependence on renal dialysis: Secondary | ICD-10-CM | POA: Diagnosis not present

## 2020-10-11 DIAGNOSIS — N2581 Secondary hyperparathyroidism of renal origin: Secondary | ICD-10-CM | POA: Diagnosis not present

## 2020-10-11 DIAGNOSIS — Z992 Dependence on renal dialysis: Secondary | ICD-10-CM | POA: Diagnosis not present

## 2020-10-11 DIAGNOSIS — N186 End stage renal disease: Secondary | ICD-10-CM | POA: Diagnosis not present

## 2020-10-13 DIAGNOSIS — Z992 Dependence on renal dialysis: Secondary | ICD-10-CM | POA: Diagnosis not present

## 2020-10-13 DIAGNOSIS — N186 End stage renal disease: Secondary | ICD-10-CM | POA: Diagnosis not present

## 2020-10-13 DIAGNOSIS — N2581 Secondary hyperparathyroidism of renal origin: Secondary | ICD-10-CM | POA: Diagnosis not present

## 2020-10-16 DIAGNOSIS — N2581 Secondary hyperparathyroidism of renal origin: Secondary | ICD-10-CM | POA: Diagnosis not present

## 2020-10-16 DIAGNOSIS — N186 End stage renal disease: Secondary | ICD-10-CM | POA: Diagnosis not present

## 2020-10-16 DIAGNOSIS — Z992 Dependence on renal dialysis: Secondary | ICD-10-CM | POA: Diagnosis not present

## 2020-10-18 DIAGNOSIS — N2581 Secondary hyperparathyroidism of renal origin: Secondary | ICD-10-CM | POA: Diagnosis not present

## 2020-10-18 DIAGNOSIS — Z992 Dependence on renal dialysis: Secondary | ICD-10-CM | POA: Diagnosis not present

## 2020-10-18 DIAGNOSIS — N186 End stage renal disease: Secondary | ICD-10-CM | POA: Diagnosis not present

## 2020-10-20 DIAGNOSIS — Z992 Dependence on renal dialysis: Secondary | ICD-10-CM | POA: Diagnosis not present

## 2020-10-20 DIAGNOSIS — N2581 Secondary hyperparathyroidism of renal origin: Secondary | ICD-10-CM | POA: Diagnosis not present

## 2020-10-20 DIAGNOSIS — N186 End stage renal disease: Secondary | ICD-10-CM | POA: Diagnosis not present

## 2020-10-21 DIAGNOSIS — T8612 Kidney transplant failure: Secondary | ICD-10-CM | POA: Diagnosis not present

## 2020-10-21 DIAGNOSIS — N186 End stage renal disease: Secondary | ICD-10-CM | POA: Diagnosis not present

## 2020-10-21 DIAGNOSIS — Z992 Dependence on renal dialysis: Secondary | ICD-10-CM | POA: Diagnosis not present

## 2020-10-23 DIAGNOSIS — Z992 Dependence on renal dialysis: Secondary | ICD-10-CM | POA: Diagnosis not present

## 2020-10-23 DIAGNOSIS — N186 End stage renal disease: Secondary | ICD-10-CM | POA: Diagnosis not present

## 2020-10-23 DIAGNOSIS — N2581 Secondary hyperparathyroidism of renal origin: Secondary | ICD-10-CM | POA: Diagnosis not present

## 2020-10-25 DIAGNOSIS — Z992 Dependence on renal dialysis: Secondary | ICD-10-CM | POA: Diagnosis not present

## 2020-10-25 DIAGNOSIS — N2581 Secondary hyperparathyroidism of renal origin: Secondary | ICD-10-CM | POA: Diagnosis not present

## 2020-10-25 DIAGNOSIS — N186 End stage renal disease: Secondary | ICD-10-CM | POA: Diagnosis not present

## 2020-10-27 DIAGNOSIS — Z992 Dependence on renal dialysis: Secondary | ICD-10-CM | POA: Diagnosis not present

## 2020-10-27 DIAGNOSIS — N186 End stage renal disease: Secondary | ICD-10-CM | POA: Diagnosis not present

## 2020-10-27 DIAGNOSIS — N2581 Secondary hyperparathyroidism of renal origin: Secondary | ICD-10-CM | POA: Diagnosis not present

## 2020-10-30 DIAGNOSIS — Z992 Dependence on renal dialysis: Secondary | ICD-10-CM | POA: Diagnosis not present

## 2020-10-30 DIAGNOSIS — N2581 Secondary hyperparathyroidism of renal origin: Secondary | ICD-10-CM | POA: Diagnosis not present

## 2020-10-30 DIAGNOSIS — N186 End stage renal disease: Secondary | ICD-10-CM | POA: Diagnosis not present

## 2020-11-01 ENCOUNTER — Encounter: Payer: Self-pay | Admitting: Vascular Surgery

## 2020-11-01 ENCOUNTER — Ambulatory Visit (HOSPITAL_COMMUNITY)
Admission: RE | Admit: 2020-11-01 | Discharge: 2020-11-01 | Disposition: A | Discharge status: Home / Self Care | Payer: Medicare HMO | Source: Ambulatory Visit | Attending: Vascular Surgery | Admitting: Vascular Surgery

## 2020-11-01 ENCOUNTER — Other Ambulatory Visit: Payer: Self-pay

## 2020-11-01 ENCOUNTER — Ambulatory Visit (INDEPENDENT_AMBULATORY_CARE_PROVIDER_SITE_OTHER): Payer: Medicare HMO | Admitting: Vascular Surgery

## 2020-11-01 VITALS — BP 85/53 | HR 100 | Temp 98.0°F | Resp 20 | Ht 70.0 in | Wt 191.0 lb

## 2020-11-01 DIAGNOSIS — N186 End stage renal disease: Secondary | ICD-10-CM | POA: Insufficient documentation

## 2020-11-01 DIAGNOSIS — N2581 Secondary hyperparathyroidism of renal origin: Secondary | ICD-10-CM | POA: Diagnosis not present

## 2020-11-01 DIAGNOSIS — Z992 Dependence on renal dialysis: Secondary | ICD-10-CM | POA: Diagnosis not present

## 2020-11-01 NOTE — Progress Notes (Signed)
Patient is a 60 year old male who returns for follow-up today.  He was last seen September 27, 2020.  He underwent ligation of an AV fistula in the left arm in 2019.  This subsequently became more aneurysmal and he had excision of this in April 2022.  This was repaired with a bovine pericardial patch.  This was complicated by a postoperative hematoma.  He returns today for further follow-up in the left arm finally has completely healed.  He currently is dialyzing through a tunneled catheter.  His dialysis day is Tuesday Thursday Saturday.  He would prefer to have a new access placed but stay in the left arm.   Past Medical History:  Diagnosis Date   Allergy    Anemia    Blood transfusion without reported diagnosis    Dialysis patient ([place])    Tues,thurs,sat   ESRD (end stage renal disease) ([place])    TTHS-    Family history of adverse reaction to anesthesia    father had allergic reaction with anesthesia with a dental procedure-(pt. doesn't know)   Hyperlipidemia    Hypertension    Past Surgical History:  Procedure Laterality Date   A/V FISTULAGRAM Left 12/15/2016   Procedure: A/V Fistulagram - left;  Surgeon: Angelia Mould, MD;  Location: [facility];  Service: Cardiovascular;  Laterality: Left;   AV FISTULA PLACEMENT Left 08/28/2016   Procedure: LEFT UPPER  ARM ARTERIOVENOUS (AV) FISTULA CREATION;  Surgeon: Angelia Mould, MD;  Location: [facility];  Service: Vascular;  Laterality: Left;   DIALYSIS/PERMA CATHETER INSERTION Right 12/21/2017   Procedure: INSERTION OF DIALYSIS CATHETER Right Internal Jugular .;  Surgeon: Elam Dutch, MD;  Location: [facility];  Service: Vascular;  Laterality: Right;   FISTULA SUPERFICIALIZATION Left 09/23/2017   Procedure: FISTULA PLICATION LEFT ARM;  Surgeon: Elam Dutch, MD;  Location: [facility];  Service: Vascular;  Laterality: Left;   FISTULOGRAM Left 12/21/2017   Procedure: FISTULOGRAM with Balloon Angioplasty.;  Surgeon: Elam Dutch, MD;   Location: [facility];  Service: Vascular;  Laterality: Left;   HEMATOMA EVACUATION Left 08/27/2020   Procedure: EVACUATION HEMATOMA LEFT ARM;  Surgeon: Elam Dutch, MD;  Location: [facility];  Service: Vascular;  Laterality: Left;   IR THORACENTESIS ASP PLEURAL SPACE W/IMG GUIDE  08/22/2017   1.2 L -right-sided   IR THORACENTESIS ASP PLEURAL SPACE W/IMG GUIDE  10/19/2017   REVISON OF ARTERIOVENOUS FISTULA Left [id number]   Procedure: PLICATION and Ligation of F LEFT ARM ARTERIOVENOUS FISTULA;  Surgeon: Elam Dutch, MD;  Location: [facility];  Service: Vascular;  Laterality: Left;   REVISON OF ARTERIOVENOUS FISTULA Left 07/16/2020   Procedure: EXCISION OF LEFT ARM ARTERIOVENOUS FISTULA;  Surgeon: Elam Dutch, MD;  Location: [facility];  Service: Vascular;  Laterality: Left;   stent in kidneys     dec 2017   TRANSTHORACIC ECHOCARDIOGRAM  09/22/2017    Severe LVH.  Normal function -EF 55-60%.  GRII DD.  Moderate RV dilation with mildly reduced RV function.   Fixed right coronary cusp with very mild aortic stenosis.  The myocardium has a speckled appearance --> .   Recommend cardiac MRI to evaluate for amyloid   UPPER EXTREMITY VENOGRAPHY Bilateral 06/01/2020   Procedure: UPPER EXTREMITY VENOGRAPHY;  Surgeon: Angelia Mould, MD;  Location: [facility];  Service: Cardiovascular;  Laterality: Bilateral;    Current Outpatient Medications on File Prior to Visit  Medication Sig Dispense Refill   aspirin 325  MG tablet Take 325 mg by mouth daily.     cephALEXin (KEFLEX) 500 MG capsule Take 1 capsule (500 mg total) by mouth 2 (two) times daily. 28 capsule 0   Cholecalciferol (VITAMIN D3) 125 MCG (5000 UT) CAPS Take 5,000 Units by mouth daily.     cinacalcet (SENSIPAR) 30 MG tablet Take 30 mg by mouth at bedtime.  11   cloNIDine (CATAPRES) 0.1 MG tablet Take 1 tablet (0.1 mg total) by mouth 2 (two) times daily. (Patient not taking: No sig reported) 60 tablet 11   Ferrous Sulfate (IRON) 325 (65  Fe) MG TABS Take 325 mg by mouth at bedtime.     furosemide (LASIX) 80 MG tablet TAKE 1 TABLET (80 MG TOTAL) BY MOUTH 2 (TWO) TIMES DAILY. (Patient not taking: No sig reported) 180 tablet 1   labetalol (NORMODYNE) 300 MG tablet TAKE 1 TABLET (300 MG TOTAL) BY MOUTH 2 (TWO) TIMES DAILY. OFFICE VISIT NEEDED (Patient not taking: No sig reported) 60 tablet 0   LOKELMA 10 g PACK packet Take 1 packet by mouth at bedtime.     losartan (COZAAR) 50 MG tablet Take 1 tablet (50 mg total) by mouth daily. (Patient not taking: No sig reported) 90 tablet 1   multivitamin (RENA-VIT) TABS tablet Take 1 tablet by mouth daily.     NIFEdipine (PROCARDIA-XL/NIFEDICAL-XL) 30 MG 24 hr tablet Take 30 mg by mouth at bedtime.     omeprazole (PRILOSEC) 20 MG capsule TAKE 1 CAPSULE BY MOUTH EVERY DAY (Patient not taking: No sig reported) 60 capsule 0   OVER THE COUNTER MEDICATION Take 1 tablet by mouth daily. Zinc and Copper     oxyCODONE (OXY IR/ROXICODONE) 5 MG immediate release tablet Take 1 tablet (5 mg total) by mouth every 4 (four) hours as needed (for pain score of 1-4). 30 tablet 0   pravastatin (PRAVACHOL) 80 MG tablet Take 1 tablet (80 mg total) by mouth at bedtime. (Patient not taking: No sig reported) 90 tablet 3   sevelamer carbonate (RENVELA) 800 MG tablet Take 1 tablet (800 mg total) by mouth 3 (three) times daily with meals. (Patient taking differently: Take 800-1,600 mg by mouth See admin instructions. Take 2 tablets by mouth with each meal and 1 tablet with snacks) 90 tablet 0   tetrahydrozoline-zinc (VISINE-AC) 0.05-0.25 % ophthalmic solution Place 2 drops into both eyes 3 (three) times daily as needed (dry eyes).     triamcinolone cream (KENALOG) 0.1 % Apply 1 application topically daily as needed (dry spots on face/excema).     Current Facility-Administered Medications on File Prior to Visit  Medication Dose Route Frequency Provider Last Rate Last Admin   0.9 %  sodium chloride infusion  250 mL Intravenous  PRN Briena Swingler, Jessy Oto, MD       sodium chloride flush (NS) 0.9 % injection 3 mL  3 mL Intravenous Q12H Levy Cedano, Jessy Oto, MD       sodium chloride flush (NS) 0.9 % injection 3 mL  3 mL Intravenous PRN Elam Dutch, MD       Physical exam:  Vitals:   11/01/20 1423  BP: (!) 85/53  Pulse: 100  Resp: 20  Temp: 98 F (36.7 C)  SpO2: 96%  Weight: 191 lb (86.6 kg)  Height: '5\' 10"'$  (1.778 m)   Extremities: Absent left radial pulse 2+ brachial pulse well-healed incision left antecubital area, 2+ right radial pulse 2+ right brachial pulse  Data: Patient had a vein mapping ultrasound today  of his left arm which shows the basilic vein is 99991111 mm diameter.  Of note previous right arm vein mapping in 2019 showed the cephalic vein was 4 mm in diameter and the basilic vein was also 4 mm in diameter.  Assessment: Healed left upper extremity patient needs new long-term hemodialysis access.  He wants to wait 2-3 more weeks to continue to let his left arm heal.  I discussed with him today the possibility of placing a new access.  We talked about using the right arm but he would prefer to stay in the left arm.  He seems to have an adequate left basilic vein.  Plan: Patient will follow up in 2 to 3 weeks for further discussions regarding access placement but most likely he would prefer a left basilic vein fistula.  He will be seeing Dr. Scot Dock at that office visit.  Ruta Hinds, MD [facility] Office: [phone]

## 2020-11-03 DIAGNOSIS — Z992 Dependence on renal dialysis: Secondary | ICD-10-CM | POA: Diagnosis not present

## 2020-11-03 DIAGNOSIS — N186 End stage renal disease: Secondary | ICD-10-CM | POA: Diagnosis not present

## 2020-11-03 DIAGNOSIS — N2581 Secondary hyperparathyroidism of renal origin: Secondary | ICD-10-CM | POA: Diagnosis not present

## 2020-11-06 DIAGNOSIS — N2581 Secondary hyperparathyroidism of renal origin: Secondary | ICD-10-CM | POA: Diagnosis not present

## 2020-11-06 DIAGNOSIS — Z992 Dependence on renal dialysis: Secondary | ICD-10-CM | POA: Diagnosis not present

## 2020-11-06 DIAGNOSIS — N186 End stage renal disease: Secondary | ICD-10-CM | POA: Diagnosis not present

## 2020-11-07 DIAGNOSIS — H52209 Unspecified astigmatism, unspecified eye: Secondary | ICD-10-CM | POA: Diagnosis not present

## 2020-11-07 DIAGNOSIS — H5203 Hypermetropia, bilateral: Secondary | ICD-10-CM | POA: Diagnosis not present

## 2020-11-08 DIAGNOSIS — N186 End stage renal disease: Secondary | ICD-10-CM | POA: Diagnosis not present

## 2020-11-08 DIAGNOSIS — N2581 Secondary hyperparathyroidism of renal origin: Secondary | ICD-10-CM | POA: Diagnosis not present

## 2020-11-08 DIAGNOSIS — Z992 Dependence on renal dialysis: Secondary | ICD-10-CM | POA: Diagnosis not present

## 2020-11-10 DIAGNOSIS — N2581 Secondary hyperparathyroidism of renal origin: Secondary | ICD-10-CM | POA: Diagnosis not present

## 2020-11-10 DIAGNOSIS — N186 End stage renal disease: Secondary | ICD-10-CM | POA: Diagnosis not present

## 2020-11-10 DIAGNOSIS — Z992 Dependence on renal dialysis: Secondary | ICD-10-CM | POA: Diagnosis not present

## 2020-11-13 DIAGNOSIS — Z992 Dependence on renal dialysis: Secondary | ICD-10-CM | POA: Diagnosis not present

## 2020-11-13 DIAGNOSIS — N186 End stage renal disease: Secondary | ICD-10-CM | POA: Diagnosis not present

## 2020-11-13 DIAGNOSIS — N2581 Secondary hyperparathyroidism of renal origin: Secondary | ICD-10-CM | POA: Diagnosis not present

## 2020-11-15 DIAGNOSIS — Z992 Dependence on renal dialysis: Secondary | ICD-10-CM | POA: Diagnosis not present

## 2020-11-15 DIAGNOSIS — N186 End stage renal disease: Secondary | ICD-10-CM | POA: Diagnosis not present

## 2020-11-15 DIAGNOSIS — N2581 Secondary hyperparathyroidism of renal origin: Secondary | ICD-10-CM | POA: Diagnosis not present

## 2020-11-17 DIAGNOSIS — Z992 Dependence on renal dialysis: Secondary | ICD-10-CM | POA: Diagnosis not present

## 2020-11-17 DIAGNOSIS — N2581 Secondary hyperparathyroidism of renal origin: Secondary | ICD-10-CM | POA: Diagnosis not present

## 2020-11-17 DIAGNOSIS — N186 End stage renal disease: Secondary | ICD-10-CM | POA: Diagnosis not present

## 2020-11-20 DIAGNOSIS — N186 End stage renal disease: Secondary | ICD-10-CM | POA: Diagnosis not present

## 2020-11-20 DIAGNOSIS — Z992 Dependence on renal dialysis: Secondary | ICD-10-CM | POA: Diagnosis not present

## 2020-11-20 DIAGNOSIS — N2581 Secondary hyperparathyroidism of renal origin: Secondary | ICD-10-CM | POA: Diagnosis not present

## 2020-11-21 ENCOUNTER — Ambulatory Visit: Payer: Medicare HMO | Admitting: Vascular Surgery

## 2020-11-21 DIAGNOSIS — T8612 Kidney transplant failure: Secondary | ICD-10-CM | POA: Diagnosis not present

## 2020-11-21 DIAGNOSIS — N186 End stage renal disease: Secondary | ICD-10-CM | POA: Diagnosis not present

## 2020-11-21 DIAGNOSIS — Z992 Dependence on renal dialysis: Secondary | ICD-10-CM | POA: Diagnosis not present

## 2020-11-22 DIAGNOSIS — Z992 Dependence on renal dialysis: Secondary | ICD-10-CM | POA: Diagnosis not present

## 2020-11-22 DIAGNOSIS — N186 End stage renal disease: Secondary | ICD-10-CM | POA: Diagnosis not present

## 2020-11-22 DIAGNOSIS — N2581 Secondary hyperparathyroidism of renal origin: Secondary | ICD-10-CM | POA: Diagnosis not present

## 2020-11-24 DIAGNOSIS — Z992 Dependence on renal dialysis: Secondary | ICD-10-CM | POA: Diagnosis not present

## 2020-11-24 DIAGNOSIS — N186 End stage renal disease: Secondary | ICD-10-CM | POA: Diagnosis not present

## 2020-11-24 DIAGNOSIS — N2581 Secondary hyperparathyroidism of renal origin: Secondary | ICD-10-CM | POA: Diagnosis not present

## 2020-11-27 DIAGNOSIS — Z992 Dependence on renal dialysis: Secondary | ICD-10-CM | POA: Diagnosis not present

## 2020-11-27 DIAGNOSIS — N186 End stage renal disease: Secondary | ICD-10-CM | POA: Diagnosis not present

## 2020-11-27 DIAGNOSIS — N2581 Secondary hyperparathyroidism of renal origin: Secondary | ICD-10-CM | POA: Diagnosis not present

## 2020-11-29 DIAGNOSIS — N2581 Secondary hyperparathyroidism of renal origin: Secondary | ICD-10-CM | POA: Diagnosis not present

## 2020-11-29 DIAGNOSIS — N186 End stage renal disease: Secondary | ICD-10-CM | POA: Diagnosis not present

## 2020-11-29 DIAGNOSIS — Z992 Dependence on renal dialysis: Secondary | ICD-10-CM | POA: Diagnosis not present

## 2020-12-01 DIAGNOSIS — Z992 Dependence on renal dialysis: Secondary | ICD-10-CM | POA: Diagnosis not present

## 2020-12-01 DIAGNOSIS — N186 End stage renal disease: Secondary | ICD-10-CM | POA: Diagnosis not present

## 2020-12-01 DIAGNOSIS — N2581 Secondary hyperparathyroidism of renal origin: Secondary | ICD-10-CM | POA: Diagnosis not present

## 2020-12-04 DIAGNOSIS — N186 End stage renal disease: Secondary | ICD-10-CM | POA: Diagnosis not present

## 2020-12-04 DIAGNOSIS — Z992 Dependence on renal dialysis: Secondary | ICD-10-CM | POA: Diagnosis not present

## 2020-12-04 DIAGNOSIS — N2581 Secondary hyperparathyroidism of renal origin: Secondary | ICD-10-CM | POA: Diagnosis not present

## 2020-12-06 DIAGNOSIS — Z992 Dependence on renal dialysis: Secondary | ICD-10-CM | POA: Diagnosis not present

## 2020-12-06 DIAGNOSIS — N186 End stage renal disease: Secondary | ICD-10-CM | POA: Diagnosis not present

## 2020-12-06 DIAGNOSIS — N2581 Secondary hyperparathyroidism of renal origin: Secondary | ICD-10-CM | POA: Diagnosis not present

## 2020-12-08 DIAGNOSIS — Z992 Dependence on renal dialysis: Secondary | ICD-10-CM | POA: Diagnosis not present

## 2020-12-08 DIAGNOSIS — N186 End stage renal disease: Secondary | ICD-10-CM | POA: Diagnosis not present

## 2020-12-08 DIAGNOSIS — N2581 Secondary hyperparathyroidism of renal origin: Secondary | ICD-10-CM | POA: Diagnosis not present

## 2020-12-11 DIAGNOSIS — N2581 Secondary hyperparathyroidism of renal origin: Secondary | ICD-10-CM | POA: Diagnosis not present

## 2020-12-11 DIAGNOSIS — N186 End stage renal disease: Secondary | ICD-10-CM | POA: Diagnosis not present

## 2020-12-11 DIAGNOSIS — Z992 Dependence on renal dialysis: Secondary | ICD-10-CM | POA: Diagnosis not present

## 2020-12-13 DIAGNOSIS — N186 End stage renal disease: Secondary | ICD-10-CM | POA: Diagnosis not present

## 2020-12-13 DIAGNOSIS — N2581 Secondary hyperparathyroidism of renal origin: Secondary | ICD-10-CM | POA: Diagnosis not present

## 2020-12-13 DIAGNOSIS — Z992 Dependence on renal dialysis: Secondary | ICD-10-CM | POA: Diagnosis not present

## 2020-12-15 DIAGNOSIS — N186 End stage renal disease: Secondary | ICD-10-CM | POA: Diagnosis not present

## 2020-12-15 DIAGNOSIS — Z992 Dependence on renal dialysis: Secondary | ICD-10-CM | POA: Diagnosis not present

## 2020-12-15 DIAGNOSIS — N2581 Secondary hyperparathyroidism of renal origin: Secondary | ICD-10-CM | POA: Diagnosis not present

## 2020-12-18 DIAGNOSIS — N186 End stage renal disease: Secondary | ICD-10-CM | POA: Diagnosis not present

## 2020-12-18 DIAGNOSIS — Z992 Dependence on renal dialysis: Secondary | ICD-10-CM | POA: Diagnosis not present

## 2020-12-18 DIAGNOSIS — N2581 Secondary hyperparathyroidism of renal origin: Secondary | ICD-10-CM | POA: Diagnosis not present

## 2020-12-20 DIAGNOSIS — N186 End stage renal disease: Secondary | ICD-10-CM | POA: Diagnosis not present

## 2020-12-20 DIAGNOSIS — N2581 Secondary hyperparathyroidism of renal origin: Secondary | ICD-10-CM | POA: Diagnosis not present

## 2020-12-20 DIAGNOSIS — Z992 Dependence on renal dialysis: Secondary | ICD-10-CM | POA: Diagnosis not present

## 2020-12-21 DIAGNOSIS — N186 End stage renal disease: Secondary | ICD-10-CM | POA: Diagnosis not present

## 2020-12-21 DIAGNOSIS — Z992 Dependence on renal dialysis: Secondary | ICD-10-CM | POA: Diagnosis not present

## 2020-12-21 DIAGNOSIS — T8612 Kidney transplant failure: Secondary | ICD-10-CM | POA: Diagnosis not present

## 2020-12-22 DIAGNOSIS — N2581 Secondary hyperparathyroidism of renal origin: Secondary | ICD-10-CM | POA: Diagnosis not present

## 2020-12-22 DIAGNOSIS — D689 Coagulation defect, unspecified: Secondary | ICD-10-CM | POA: Diagnosis not present

## 2020-12-22 DIAGNOSIS — Z23 Encounter for immunization: Secondary | ICD-10-CM | POA: Diagnosis not present

## 2020-12-22 DIAGNOSIS — N186 End stage renal disease: Secondary | ICD-10-CM | POA: Diagnosis not present

## 2020-12-22 DIAGNOSIS — Z992 Dependence on renal dialysis: Secondary | ICD-10-CM | POA: Diagnosis not present

## 2020-12-22 DIAGNOSIS — E876 Hypokalemia: Secondary | ICD-10-CM | POA: Diagnosis not present

## 2020-12-25 DIAGNOSIS — D689 Coagulation defect, unspecified: Secondary | ICD-10-CM | POA: Diagnosis not present

## 2020-12-25 DIAGNOSIS — Z992 Dependence on renal dialysis: Secondary | ICD-10-CM | POA: Diagnosis not present

## 2020-12-25 DIAGNOSIS — E876 Hypokalemia: Secondary | ICD-10-CM | POA: Diagnosis not present

## 2020-12-25 DIAGNOSIS — N186 End stage renal disease: Secondary | ICD-10-CM | POA: Diagnosis not present

## 2020-12-25 DIAGNOSIS — N2581 Secondary hyperparathyroidism of renal origin: Secondary | ICD-10-CM | POA: Diagnosis not present

## 2020-12-25 DIAGNOSIS — Z23 Encounter for immunization: Secondary | ICD-10-CM | POA: Diagnosis not present

## 2020-12-27 DIAGNOSIS — Z23 Encounter for immunization: Secondary | ICD-10-CM | POA: Diagnosis not present

## 2020-12-27 DIAGNOSIS — E876 Hypokalemia: Secondary | ICD-10-CM | POA: Diagnosis not present

## 2020-12-27 DIAGNOSIS — N2581 Secondary hyperparathyroidism of renal origin: Secondary | ICD-10-CM | POA: Diagnosis not present

## 2020-12-27 DIAGNOSIS — D689 Coagulation defect, unspecified: Secondary | ICD-10-CM | POA: Diagnosis not present

## 2020-12-27 DIAGNOSIS — N186 End stage renal disease: Secondary | ICD-10-CM | POA: Diagnosis not present

## 2020-12-27 DIAGNOSIS — Z992 Dependence on renal dialysis: Secondary | ICD-10-CM | POA: Diagnosis not present

## 2020-12-29 DIAGNOSIS — Z23 Encounter for immunization: Secondary | ICD-10-CM | POA: Diagnosis not present

## 2020-12-29 DIAGNOSIS — D689 Coagulation defect, unspecified: Secondary | ICD-10-CM | POA: Diagnosis not present

## 2020-12-29 DIAGNOSIS — N186 End stage renal disease: Secondary | ICD-10-CM | POA: Diagnosis not present

## 2020-12-29 DIAGNOSIS — E876 Hypokalemia: Secondary | ICD-10-CM | POA: Diagnosis not present

## 2020-12-29 DIAGNOSIS — N2581 Secondary hyperparathyroidism of renal origin: Secondary | ICD-10-CM | POA: Diagnosis not present

## 2020-12-29 DIAGNOSIS — Z992 Dependence on renal dialysis: Secondary | ICD-10-CM | POA: Diagnosis not present

## 2021-01-01 DIAGNOSIS — Z23 Encounter for immunization: Secondary | ICD-10-CM | POA: Diagnosis not present

## 2021-01-01 DIAGNOSIS — N2581 Secondary hyperparathyroidism of renal origin: Secondary | ICD-10-CM | POA: Diagnosis not present

## 2021-01-01 DIAGNOSIS — Z992 Dependence on renal dialysis: Secondary | ICD-10-CM | POA: Diagnosis not present

## 2021-01-01 DIAGNOSIS — E876 Hypokalemia: Secondary | ICD-10-CM | POA: Diagnosis not present

## 2021-01-01 DIAGNOSIS — N186 End stage renal disease: Secondary | ICD-10-CM | POA: Diagnosis not present

## 2021-01-01 DIAGNOSIS — D689 Coagulation defect, unspecified: Secondary | ICD-10-CM | POA: Diagnosis not present

## 2021-01-03 DIAGNOSIS — D689 Coagulation defect, unspecified: Secondary | ICD-10-CM | POA: Diagnosis not present

## 2021-01-03 DIAGNOSIS — N2581 Secondary hyperparathyroidism of renal origin: Secondary | ICD-10-CM | POA: Diagnosis not present

## 2021-01-03 DIAGNOSIS — E876 Hypokalemia: Secondary | ICD-10-CM | POA: Diagnosis not present

## 2021-01-03 DIAGNOSIS — Z992 Dependence on renal dialysis: Secondary | ICD-10-CM | POA: Diagnosis not present

## 2021-01-03 DIAGNOSIS — Z23 Encounter for immunization: Secondary | ICD-10-CM | POA: Diagnosis not present

## 2021-01-03 DIAGNOSIS — N186 End stage renal disease: Secondary | ICD-10-CM | POA: Diagnosis not present

## 2021-01-05 DIAGNOSIS — E876 Hypokalemia: Secondary | ICD-10-CM | POA: Diagnosis not present

## 2021-01-05 DIAGNOSIS — N186 End stage renal disease: Secondary | ICD-10-CM | POA: Diagnosis not present

## 2021-01-05 DIAGNOSIS — Z992 Dependence on renal dialysis: Secondary | ICD-10-CM | POA: Diagnosis not present

## 2021-01-05 DIAGNOSIS — N2581 Secondary hyperparathyroidism of renal origin: Secondary | ICD-10-CM | POA: Diagnosis not present

## 2021-01-05 DIAGNOSIS — D689 Coagulation defect, unspecified: Secondary | ICD-10-CM | POA: Diagnosis not present

## 2021-01-05 DIAGNOSIS — Z23 Encounter for immunization: Secondary | ICD-10-CM | POA: Diagnosis not present

## 2021-01-08 DIAGNOSIS — N2581 Secondary hyperparathyroidism of renal origin: Secondary | ICD-10-CM | POA: Diagnosis not present

## 2021-01-08 DIAGNOSIS — D689 Coagulation defect, unspecified: Secondary | ICD-10-CM | POA: Diagnosis not present

## 2021-01-08 DIAGNOSIS — Z992 Dependence on renal dialysis: Secondary | ICD-10-CM | POA: Diagnosis not present

## 2021-01-08 DIAGNOSIS — E876 Hypokalemia: Secondary | ICD-10-CM | POA: Diagnosis not present

## 2021-01-08 DIAGNOSIS — Z23 Encounter for immunization: Secondary | ICD-10-CM | POA: Diagnosis not present

## 2021-01-08 DIAGNOSIS — N186 End stage renal disease: Secondary | ICD-10-CM | POA: Diagnosis not present

## 2021-01-10 DIAGNOSIS — Z992 Dependence on renal dialysis: Secondary | ICD-10-CM | POA: Diagnosis not present

## 2021-01-10 DIAGNOSIS — E876 Hypokalemia: Secondary | ICD-10-CM | POA: Diagnosis not present

## 2021-01-10 DIAGNOSIS — N2581 Secondary hyperparathyroidism of renal origin: Secondary | ICD-10-CM | POA: Diagnosis not present

## 2021-01-10 DIAGNOSIS — Z23 Encounter for immunization: Secondary | ICD-10-CM | POA: Diagnosis not present

## 2021-01-10 DIAGNOSIS — D689 Coagulation defect, unspecified: Secondary | ICD-10-CM | POA: Diagnosis not present

## 2021-01-10 DIAGNOSIS — N186 End stage renal disease: Secondary | ICD-10-CM | POA: Diagnosis not present

## 2021-01-12 DIAGNOSIS — Z23 Encounter for immunization: Secondary | ICD-10-CM | POA: Diagnosis not present

## 2021-01-12 DIAGNOSIS — D689 Coagulation defect, unspecified: Secondary | ICD-10-CM | POA: Diagnosis not present

## 2021-01-12 DIAGNOSIS — Z992 Dependence on renal dialysis: Secondary | ICD-10-CM | POA: Diagnosis not present

## 2021-01-12 DIAGNOSIS — N186 End stage renal disease: Secondary | ICD-10-CM | POA: Diagnosis not present

## 2021-01-12 DIAGNOSIS — N2581 Secondary hyperparathyroidism of renal origin: Secondary | ICD-10-CM | POA: Diagnosis not present

## 2021-01-12 DIAGNOSIS — E876 Hypokalemia: Secondary | ICD-10-CM | POA: Diagnosis not present

## 2021-01-15 DIAGNOSIS — Z992 Dependence on renal dialysis: Secondary | ICD-10-CM | POA: Diagnosis not present

## 2021-01-15 DIAGNOSIS — E876 Hypokalemia: Secondary | ICD-10-CM | POA: Diagnosis not present

## 2021-01-15 DIAGNOSIS — N2581 Secondary hyperparathyroidism of renal origin: Secondary | ICD-10-CM | POA: Diagnosis not present

## 2021-01-15 DIAGNOSIS — N186 End stage renal disease: Secondary | ICD-10-CM | POA: Diagnosis not present

## 2021-01-15 DIAGNOSIS — D689 Coagulation defect, unspecified: Secondary | ICD-10-CM | POA: Diagnosis not present

## 2021-01-15 DIAGNOSIS — Z23 Encounter for immunization: Secondary | ICD-10-CM | POA: Diagnosis not present

## 2021-01-17 DIAGNOSIS — Z992 Dependence on renal dialysis: Secondary | ICD-10-CM | POA: Diagnosis not present

## 2021-01-17 DIAGNOSIS — N186 End stage renal disease: Secondary | ICD-10-CM | POA: Diagnosis not present

## 2021-01-17 DIAGNOSIS — N2581 Secondary hyperparathyroidism of renal origin: Secondary | ICD-10-CM | POA: Diagnosis not present

## 2021-01-17 DIAGNOSIS — Z23 Encounter for immunization: Secondary | ICD-10-CM | POA: Diagnosis not present

## 2021-01-17 DIAGNOSIS — D689 Coagulation defect, unspecified: Secondary | ICD-10-CM | POA: Diagnosis not present

## 2021-01-17 DIAGNOSIS — E876 Hypokalemia: Secondary | ICD-10-CM | POA: Diagnosis not present

## 2021-01-19 DIAGNOSIS — N186 End stage renal disease: Secondary | ICD-10-CM | POA: Diagnosis not present

## 2021-01-19 DIAGNOSIS — E876 Hypokalemia: Secondary | ICD-10-CM | POA: Diagnosis not present

## 2021-01-19 DIAGNOSIS — Z23 Encounter for immunization: Secondary | ICD-10-CM | POA: Diagnosis not present

## 2021-01-19 DIAGNOSIS — Z992 Dependence on renal dialysis: Secondary | ICD-10-CM | POA: Diagnosis not present

## 2021-01-19 DIAGNOSIS — N2581 Secondary hyperparathyroidism of renal origin: Secondary | ICD-10-CM | POA: Diagnosis not present

## 2021-01-19 DIAGNOSIS — D689 Coagulation defect, unspecified: Secondary | ICD-10-CM | POA: Diagnosis not present

## 2021-01-21 DIAGNOSIS — T8612 Kidney transplant failure: Secondary | ICD-10-CM | POA: Diagnosis not present

## 2021-01-21 DIAGNOSIS — Z992 Dependence on renal dialysis: Secondary | ICD-10-CM | POA: Diagnosis not present

## 2021-01-21 DIAGNOSIS — N186 End stage renal disease: Secondary | ICD-10-CM | POA: Diagnosis not present

## 2021-01-22 DIAGNOSIS — E876 Hypokalemia: Secondary | ICD-10-CM | POA: Diagnosis not present

## 2021-01-22 DIAGNOSIS — D689 Coagulation defect, unspecified: Secondary | ICD-10-CM | POA: Diagnosis not present

## 2021-01-22 DIAGNOSIS — N2581 Secondary hyperparathyroidism of renal origin: Secondary | ICD-10-CM | POA: Diagnosis not present

## 2021-01-22 DIAGNOSIS — N186 End stage renal disease: Secondary | ICD-10-CM | POA: Diagnosis not present

## 2021-01-22 DIAGNOSIS — Z992 Dependence on renal dialysis: Secondary | ICD-10-CM | POA: Diagnosis not present

## 2021-01-24 DIAGNOSIS — Z992 Dependence on renal dialysis: Secondary | ICD-10-CM | POA: Diagnosis not present

## 2021-01-24 DIAGNOSIS — D689 Coagulation defect, unspecified: Secondary | ICD-10-CM | POA: Diagnosis not present

## 2021-01-24 DIAGNOSIS — N186 End stage renal disease: Secondary | ICD-10-CM | POA: Diagnosis not present

## 2021-01-24 DIAGNOSIS — E876 Hypokalemia: Secondary | ICD-10-CM | POA: Diagnosis not present

## 2021-01-24 DIAGNOSIS — N2581 Secondary hyperparathyroidism of renal origin: Secondary | ICD-10-CM | POA: Diagnosis not present

## 2021-01-26 DIAGNOSIS — Z992 Dependence on renal dialysis: Secondary | ICD-10-CM | POA: Diagnosis not present

## 2021-01-26 DIAGNOSIS — D689 Coagulation defect, unspecified: Secondary | ICD-10-CM | POA: Diagnosis not present

## 2021-01-26 DIAGNOSIS — N2581 Secondary hyperparathyroidism of renal origin: Secondary | ICD-10-CM | POA: Diagnosis not present

## 2021-01-26 DIAGNOSIS — N186 End stage renal disease: Secondary | ICD-10-CM | POA: Diagnosis not present

## 2021-01-26 DIAGNOSIS — E876 Hypokalemia: Secondary | ICD-10-CM | POA: Diagnosis not present

## 2021-01-28 DIAGNOSIS — D689 Coagulation defect, unspecified: Secondary | ICD-10-CM | POA: Diagnosis not present

## 2021-01-28 DIAGNOSIS — N2581 Secondary hyperparathyroidism of renal origin: Secondary | ICD-10-CM | POA: Diagnosis not present

## 2021-01-28 DIAGNOSIS — Z992 Dependence on renal dialysis: Secondary | ICD-10-CM | POA: Diagnosis not present

## 2021-01-28 DIAGNOSIS — N186 End stage renal disease: Secondary | ICD-10-CM | POA: Diagnosis not present

## 2021-01-28 DIAGNOSIS — E876 Hypokalemia: Secondary | ICD-10-CM | POA: Diagnosis not present

## 2021-01-31 DIAGNOSIS — N2581 Secondary hyperparathyroidism of renal origin: Secondary | ICD-10-CM | POA: Diagnosis not present

## 2021-01-31 DIAGNOSIS — E876 Hypokalemia: Secondary | ICD-10-CM | POA: Diagnosis not present

## 2021-01-31 DIAGNOSIS — N186 End stage renal disease: Secondary | ICD-10-CM | POA: Diagnosis not present

## 2021-01-31 DIAGNOSIS — D689 Coagulation defect, unspecified: Secondary | ICD-10-CM | POA: Diagnosis not present

## 2021-01-31 DIAGNOSIS — Z992 Dependence on renal dialysis: Secondary | ICD-10-CM | POA: Diagnosis not present

## 2021-02-02 DIAGNOSIS — N186 End stage renal disease: Secondary | ICD-10-CM | POA: Diagnosis not present

## 2021-02-02 DIAGNOSIS — D689 Coagulation defect, unspecified: Secondary | ICD-10-CM | POA: Diagnosis not present

## 2021-02-02 DIAGNOSIS — E876 Hypokalemia: Secondary | ICD-10-CM | POA: Diagnosis not present

## 2021-02-02 DIAGNOSIS — Z992 Dependence on renal dialysis: Secondary | ICD-10-CM | POA: Diagnosis not present

## 2021-02-02 DIAGNOSIS — N2581 Secondary hyperparathyroidism of renal origin: Secondary | ICD-10-CM | POA: Diagnosis not present

## 2021-02-05 DIAGNOSIS — N186 End stage renal disease: Secondary | ICD-10-CM | POA: Diagnosis not present

## 2021-02-05 DIAGNOSIS — E876 Hypokalemia: Secondary | ICD-10-CM | POA: Diagnosis not present

## 2021-02-05 DIAGNOSIS — Z992 Dependence on renal dialysis: Secondary | ICD-10-CM | POA: Diagnosis not present

## 2021-02-05 DIAGNOSIS — D689 Coagulation defect, unspecified: Secondary | ICD-10-CM | POA: Diagnosis not present

## 2021-02-05 DIAGNOSIS — N2581 Secondary hyperparathyroidism of renal origin: Secondary | ICD-10-CM | POA: Diagnosis not present

## 2021-02-07 DIAGNOSIS — E876 Hypokalemia: Secondary | ICD-10-CM | POA: Diagnosis not present

## 2021-02-07 DIAGNOSIS — D689 Coagulation defect, unspecified: Secondary | ICD-10-CM | POA: Diagnosis not present

## 2021-02-07 DIAGNOSIS — N186 End stage renal disease: Secondary | ICD-10-CM | POA: Diagnosis not present

## 2021-02-07 DIAGNOSIS — N2581 Secondary hyperparathyroidism of renal origin: Secondary | ICD-10-CM | POA: Diagnosis not present

## 2021-02-07 DIAGNOSIS — Z992 Dependence on renal dialysis: Secondary | ICD-10-CM | POA: Diagnosis not present

## 2021-02-09 DIAGNOSIS — Z992 Dependence on renal dialysis: Secondary | ICD-10-CM | POA: Diagnosis not present

## 2021-02-09 DIAGNOSIS — N2581 Secondary hyperparathyroidism of renal origin: Secondary | ICD-10-CM | POA: Diagnosis not present

## 2021-02-09 DIAGNOSIS — D689 Coagulation defect, unspecified: Secondary | ICD-10-CM | POA: Diagnosis not present

## 2021-02-09 DIAGNOSIS — E876 Hypokalemia: Secondary | ICD-10-CM | POA: Diagnosis not present

## 2021-02-09 DIAGNOSIS — N186 End stage renal disease: Secondary | ICD-10-CM | POA: Diagnosis not present

## 2021-02-12 DIAGNOSIS — D689 Coagulation defect, unspecified: Secondary | ICD-10-CM | POA: Diagnosis not present

## 2021-02-12 DIAGNOSIS — E876 Hypokalemia: Secondary | ICD-10-CM | POA: Diagnosis not present

## 2021-02-12 DIAGNOSIS — N2581 Secondary hyperparathyroidism of renal origin: Secondary | ICD-10-CM | POA: Diagnosis not present

## 2021-02-12 DIAGNOSIS — N186 End stage renal disease: Secondary | ICD-10-CM | POA: Diagnosis not present

## 2021-02-12 DIAGNOSIS — Z992 Dependence on renal dialysis: Secondary | ICD-10-CM | POA: Diagnosis not present

## 2021-02-15 DIAGNOSIS — E876 Hypokalemia: Secondary | ICD-10-CM | POA: Diagnosis not present

## 2021-02-15 DIAGNOSIS — D689 Coagulation defect, unspecified: Secondary | ICD-10-CM | POA: Diagnosis not present

## 2021-02-15 DIAGNOSIS — Z992 Dependence on renal dialysis: Secondary | ICD-10-CM | POA: Diagnosis not present

## 2021-02-15 DIAGNOSIS — N186 End stage renal disease: Secondary | ICD-10-CM | POA: Diagnosis not present

## 2021-02-15 DIAGNOSIS — N2581 Secondary hyperparathyroidism of renal origin: Secondary | ICD-10-CM | POA: Diagnosis not present

## 2021-02-17 DIAGNOSIS — Z992 Dependence on renal dialysis: Secondary | ICD-10-CM | POA: Diagnosis not present

## 2021-02-17 DIAGNOSIS — N2581 Secondary hyperparathyroidism of renal origin: Secondary | ICD-10-CM | POA: Diagnosis not present

## 2021-02-17 DIAGNOSIS — D689 Coagulation defect, unspecified: Secondary | ICD-10-CM | POA: Diagnosis not present

## 2021-02-17 DIAGNOSIS — E876 Hypokalemia: Secondary | ICD-10-CM | POA: Diagnosis not present

## 2021-02-17 DIAGNOSIS — N186 End stage renal disease: Secondary | ICD-10-CM | POA: Diagnosis not present

## 2021-02-19 DIAGNOSIS — N2581 Secondary hyperparathyroidism of renal origin: Secondary | ICD-10-CM | POA: Diagnosis not present

## 2021-02-19 DIAGNOSIS — N186 End stage renal disease: Secondary | ICD-10-CM | POA: Diagnosis not present

## 2021-02-19 DIAGNOSIS — D689 Coagulation defect, unspecified: Secondary | ICD-10-CM | POA: Diagnosis not present

## 2021-02-19 DIAGNOSIS — Z992 Dependence on renal dialysis: Secondary | ICD-10-CM | POA: Diagnosis not present

## 2021-02-19 DIAGNOSIS — E876 Hypokalemia: Secondary | ICD-10-CM | POA: Diagnosis not present

## 2021-02-20 DIAGNOSIS — T8612 Kidney transplant failure: Secondary | ICD-10-CM | POA: Diagnosis not present

## 2021-02-20 DIAGNOSIS — E876 Hypokalemia: Secondary | ICD-10-CM | POA: Diagnosis not present

## 2021-02-20 DIAGNOSIS — N186 End stage renal disease: Secondary | ICD-10-CM | POA: Diagnosis not present

## 2021-02-20 DIAGNOSIS — N2581 Secondary hyperparathyroidism of renal origin: Secondary | ICD-10-CM | POA: Diagnosis not present

## 2021-02-20 DIAGNOSIS — D689 Coagulation defect, unspecified: Secondary | ICD-10-CM | POA: Diagnosis not present

## 2021-02-20 DIAGNOSIS — Z992 Dependence on renal dialysis: Secondary | ICD-10-CM | POA: Diagnosis not present

## 2021-02-21 DIAGNOSIS — N186 End stage renal disease: Secondary | ICD-10-CM | POA: Diagnosis not present

## 2021-02-21 DIAGNOSIS — E875 Hyperkalemia: Secondary | ICD-10-CM | POA: Diagnosis not present

## 2021-02-21 DIAGNOSIS — E876 Hypokalemia: Secondary | ICD-10-CM | POA: Diagnosis not present

## 2021-02-21 DIAGNOSIS — N2581 Secondary hyperparathyroidism of renal origin: Secondary | ICD-10-CM | POA: Diagnosis not present

## 2021-02-21 DIAGNOSIS — D689 Coagulation defect, unspecified: Secondary | ICD-10-CM | POA: Diagnosis not present

## 2021-02-21 DIAGNOSIS — Z992 Dependence on renal dialysis: Secondary | ICD-10-CM | POA: Diagnosis not present

## 2021-02-23 DIAGNOSIS — N186 End stage renal disease: Secondary | ICD-10-CM | POA: Diagnosis not present

## 2021-02-23 DIAGNOSIS — N2581 Secondary hyperparathyroidism of renal origin: Secondary | ICD-10-CM | POA: Diagnosis not present

## 2021-02-23 DIAGNOSIS — D689 Coagulation defect, unspecified: Secondary | ICD-10-CM | POA: Diagnosis not present

## 2021-02-23 DIAGNOSIS — Z992 Dependence on renal dialysis: Secondary | ICD-10-CM | POA: Diagnosis not present

## 2021-02-23 DIAGNOSIS — E875 Hyperkalemia: Secondary | ICD-10-CM | POA: Diagnosis not present

## 2021-02-23 DIAGNOSIS — E876 Hypokalemia: Secondary | ICD-10-CM | POA: Diagnosis not present

## 2021-02-26 DIAGNOSIS — E875 Hyperkalemia: Secondary | ICD-10-CM | POA: Diagnosis not present

## 2021-02-26 DIAGNOSIS — Z992 Dependence on renal dialysis: Secondary | ICD-10-CM | POA: Diagnosis not present

## 2021-02-26 DIAGNOSIS — D689 Coagulation defect, unspecified: Secondary | ICD-10-CM | POA: Diagnosis not present

## 2021-02-26 DIAGNOSIS — N2581 Secondary hyperparathyroidism of renal origin: Secondary | ICD-10-CM | POA: Diagnosis not present

## 2021-02-26 DIAGNOSIS — N186 End stage renal disease: Secondary | ICD-10-CM | POA: Diagnosis not present

## 2021-02-26 DIAGNOSIS — E876 Hypokalemia: Secondary | ICD-10-CM | POA: Diagnosis not present

## 2021-02-28 DIAGNOSIS — E875 Hyperkalemia: Secondary | ICD-10-CM | POA: Diagnosis not present

## 2021-02-28 DIAGNOSIS — Z992 Dependence on renal dialysis: Secondary | ICD-10-CM | POA: Diagnosis not present

## 2021-02-28 DIAGNOSIS — D689 Coagulation defect, unspecified: Secondary | ICD-10-CM | POA: Diagnosis not present

## 2021-02-28 DIAGNOSIS — E876 Hypokalemia: Secondary | ICD-10-CM | POA: Diagnosis not present

## 2021-02-28 DIAGNOSIS — N2581 Secondary hyperparathyroidism of renal origin: Secondary | ICD-10-CM | POA: Diagnosis not present

## 2021-02-28 DIAGNOSIS — N186 End stage renal disease: Secondary | ICD-10-CM | POA: Diagnosis not present

## 2021-03-02 DIAGNOSIS — N186 End stage renal disease: Secondary | ICD-10-CM | POA: Diagnosis not present

## 2021-03-02 DIAGNOSIS — E876 Hypokalemia: Secondary | ICD-10-CM | POA: Diagnosis not present

## 2021-03-02 DIAGNOSIS — Z992 Dependence on renal dialysis: Secondary | ICD-10-CM | POA: Diagnosis not present

## 2021-03-02 DIAGNOSIS — N2581 Secondary hyperparathyroidism of renal origin: Secondary | ICD-10-CM | POA: Diagnosis not present

## 2021-03-02 DIAGNOSIS — E875 Hyperkalemia: Secondary | ICD-10-CM | POA: Diagnosis not present

## 2021-03-02 DIAGNOSIS — D689 Coagulation defect, unspecified: Secondary | ICD-10-CM | POA: Diagnosis not present

## 2021-03-05 DIAGNOSIS — D689 Coagulation defect, unspecified: Secondary | ICD-10-CM | POA: Diagnosis not present

## 2021-03-05 DIAGNOSIS — E875 Hyperkalemia: Secondary | ICD-10-CM | POA: Diagnosis not present

## 2021-03-05 DIAGNOSIS — Z992 Dependence on renal dialysis: Secondary | ICD-10-CM | POA: Diagnosis not present

## 2021-03-05 DIAGNOSIS — N186 End stage renal disease: Secondary | ICD-10-CM | POA: Diagnosis not present

## 2021-03-05 DIAGNOSIS — E876 Hypokalemia: Secondary | ICD-10-CM | POA: Diagnosis not present

## 2021-03-05 DIAGNOSIS — N2581 Secondary hyperparathyroidism of renal origin: Secondary | ICD-10-CM | POA: Diagnosis not present

## 2021-03-07 DIAGNOSIS — N186 End stage renal disease: Secondary | ICD-10-CM | POA: Diagnosis not present

## 2021-03-07 DIAGNOSIS — N2581 Secondary hyperparathyroidism of renal origin: Secondary | ICD-10-CM | POA: Diagnosis not present

## 2021-03-07 DIAGNOSIS — D689 Coagulation defect, unspecified: Secondary | ICD-10-CM | POA: Diagnosis not present

## 2021-03-07 DIAGNOSIS — Z992 Dependence on renal dialysis: Secondary | ICD-10-CM | POA: Diagnosis not present

## 2021-03-07 DIAGNOSIS — E875 Hyperkalemia: Secondary | ICD-10-CM | POA: Diagnosis not present

## 2021-03-07 DIAGNOSIS — E876 Hypokalemia: Secondary | ICD-10-CM | POA: Diagnosis not present

## 2021-03-09 DIAGNOSIS — D689 Coagulation defect, unspecified: Secondary | ICD-10-CM | POA: Diagnosis not present

## 2021-03-09 DIAGNOSIS — N186 End stage renal disease: Secondary | ICD-10-CM | POA: Diagnosis not present

## 2021-03-09 DIAGNOSIS — Z992 Dependence on renal dialysis: Secondary | ICD-10-CM | POA: Diagnosis not present

## 2021-03-09 DIAGNOSIS — E875 Hyperkalemia: Secondary | ICD-10-CM | POA: Diagnosis not present

## 2021-03-09 DIAGNOSIS — N2581 Secondary hyperparathyroidism of renal origin: Secondary | ICD-10-CM | POA: Diagnosis not present

## 2021-03-09 DIAGNOSIS — E876 Hypokalemia: Secondary | ICD-10-CM | POA: Diagnosis not present

## 2021-03-12 DIAGNOSIS — E875 Hyperkalemia: Secondary | ICD-10-CM | POA: Diagnosis not present

## 2021-03-12 DIAGNOSIS — D689 Coagulation defect, unspecified: Secondary | ICD-10-CM | POA: Diagnosis not present

## 2021-03-12 DIAGNOSIS — N186 End stage renal disease: Secondary | ICD-10-CM | POA: Diagnosis not present

## 2021-03-12 DIAGNOSIS — E876 Hypokalemia: Secondary | ICD-10-CM | POA: Diagnosis not present

## 2021-03-12 DIAGNOSIS — Z992 Dependence on renal dialysis: Secondary | ICD-10-CM | POA: Diagnosis not present

## 2021-03-12 DIAGNOSIS — N2581 Secondary hyperparathyroidism of renal origin: Secondary | ICD-10-CM | POA: Diagnosis not present

## 2021-03-14 DIAGNOSIS — E875 Hyperkalemia: Secondary | ICD-10-CM | POA: Diagnosis not present

## 2021-03-14 DIAGNOSIS — N2581 Secondary hyperparathyroidism of renal origin: Secondary | ICD-10-CM | POA: Diagnosis not present

## 2021-03-14 DIAGNOSIS — D689 Coagulation defect, unspecified: Secondary | ICD-10-CM | POA: Diagnosis not present

## 2021-03-14 DIAGNOSIS — E876 Hypokalemia: Secondary | ICD-10-CM | POA: Diagnosis not present

## 2021-03-14 DIAGNOSIS — Z992 Dependence on renal dialysis: Secondary | ICD-10-CM | POA: Diagnosis not present

## 2021-03-14 DIAGNOSIS — N186 End stage renal disease: Secondary | ICD-10-CM | POA: Diagnosis not present

## 2021-03-16 DIAGNOSIS — E875 Hyperkalemia: Secondary | ICD-10-CM | POA: Diagnosis not present

## 2021-03-16 DIAGNOSIS — N186 End stage renal disease: Secondary | ICD-10-CM | POA: Diagnosis not present

## 2021-03-16 DIAGNOSIS — Z992 Dependence on renal dialysis: Secondary | ICD-10-CM | POA: Diagnosis not present

## 2021-03-16 DIAGNOSIS — E876 Hypokalemia: Secondary | ICD-10-CM | POA: Diagnosis not present

## 2021-03-16 DIAGNOSIS — N2581 Secondary hyperparathyroidism of renal origin: Secondary | ICD-10-CM | POA: Diagnosis not present

## 2021-03-16 DIAGNOSIS — D689 Coagulation defect, unspecified: Secondary | ICD-10-CM | POA: Diagnosis not present

## 2021-03-19 DIAGNOSIS — N186 End stage renal disease: Secondary | ICD-10-CM | POA: Diagnosis not present

## 2021-03-19 DIAGNOSIS — Z992 Dependence on renal dialysis: Secondary | ICD-10-CM | POA: Diagnosis not present

## 2021-03-19 DIAGNOSIS — E875 Hyperkalemia: Secondary | ICD-10-CM | POA: Diagnosis not present

## 2021-03-19 DIAGNOSIS — D689 Coagulation defect, unspecified: Secondary | ICD-10-CM | POA: Diagnosis not present

## 2021-03-19 DIAGNOSIS — N2581 Secondary hyperparathyroidism of renal origin: Secondary | ICD-10-CM | POA: Diagnosis not present

## 2021-03-19 DIAGNOSIS — E876 Hypokalemia: Secondary | ICD-10-CM | POA: Diagnosis not present

## 2021-03-21 DIAGNOSIS — E875 Hyperkalemia: Secondary | ICD-10-CM | POA: Diagnosis not present

## 2021-03-21 DIAGNOSIS — D689 Coagulation defect, unspecified: Secondary | ICD-10-CM | POA: Diagnosis not present

## 2021-03-21 DIAGNOSIS — N2581 Secondary hyperparathyroidism of renal origin: Secondary | ICD-10-CM | POA: Diagnosis not present

## 2021-03-21 DIAGNOSIS — E876 Hypokalemia: Secondary | ICD-10-CM | POA: Diagnosis not present

## 2021-03-21 DIAGNOSIS — Z992 Dependence on renal dialysis: Secondary | ICD-10-CM | POA: Diagnosis not present

## 2021-03-21 DIAGNOSIS — N186 End stage renal disease: Secondary | ICD-10-CM | POA: Diagnosis not present

## 2021-03-23 DIAGNOSIS — N186 End stage renal disease: Secondary | ICD-10-CM | POA: Diagnosis not present

## 2021-03-23 DIAGNOSIS — Z992 Dependence on renal dialysis: Secondary | ICD-10-CM | POA: Diagnosis not present

## 2021-03-23 DIAGNOSIS — E875 Hyperkalemia: Secondary | ICD-10-CM | POA: Diagnosis not present

## 2021-03-23 DIAGNOSIS — E876 Hypokalemia: Secondary | ICD-10-CM | POA: Diagnosis not present

## 2021-03-23 DIAGNOSIS — T8612 Kidney transplant failure: Secondary | ICD-10-CM | POA: Diagnosis not present

## 2021-03-23 DIAGNOSIS — N2581 Secondary hyperparathyroidism of renal origin: Secondary | ICD-10-CM | POA: Diagnosis not present

## 2021-03-23 DIAGNOSIS — D689 Coagulation defect, unspecified: Secondary | ICD-10-CM | POA: Diagnosis not present

## 2021-03-26 DIAGNOSIS — E876 Hypokalemia: Secondary | ICD-10-CM | POA: Diagnosis not present

## 2021-03-26 DIAGNOSIS — N2581 Secondary hyperparathyroidism of renal origin: Secondary | ICD-10-CM | POA: Diagnosis not present

## 2021-03-26 DIAGNOSIS — N186 End stage renal disease: Secondary | ICD-10-CM | POA: Diagnosis not present

## 2021-03-26 DIAGNOSIS — D689 Coagulation defect, unspecified: Secondary | ICD-10-CM | POA: Diagnosis not present

## 2021-03-26 DIAGNOSIS — Z992 Dependence on renal dialysis: Secondary | ICD-10-CM | POA: Diagnosis not present

## 2021-03-28 DIAGNOSIS — D689 Coagulation defect, unspecified: Secondary | ICD-10-CM | POA: Diagnosis not present

## 2021-03-28 DIAGNOSIS — N2581 Secondary hyperparathyroidism of renal origin: Secondary | ICD-10-CM | POA: Diagnosis not present

## 2021-03-28 DIAGNOSIS — Z992 Dependence on renal dialysis: Secondary | ICD-10-CM | POA: Diagnosis not present

## 2021-03-28 DIAGNOSIS — E876 Hypokalemia: Secondary | ICD-10-CM | POA: Diagnosis not present

## 2021-03-28 DIAGNOSIS — N186 End stage renal disease: Secondary | ICD-10-CM | POA: Diagnosis not present

## 2021-03-30 DIAGNOSIS — D689 Coagulation defect, unspecified: Secondary | ICD-10-CM | POA: Diagnosis not present

## 2021-03-30 DIAGNOSIS — Z992 Dependence on renal dialysis: Secondary | ICD-10-CM | POA: Diagnosis not present

## 2021-03-30 DIAGNOSIS — N2581 Secondary hyperparathyroidism of renal origin: Secondary | ICD-10-CM | POA: Diagnosis not present

## 2021-03-30 DIAGNOSIS — E876 Hypokalemia: Secondary | ICD-10-CM | POA: Diagnosis not present

## 2021-03-30 DIAGNOSIS — N186 End stage renal disease: Secondary | ICD-10-CM | POA: Diagnosis not present

## 2021-04-02 DIAGNOSIS — D689 Coagulation defect, unspecified: Secondary | ICD-10-CM | POA: Diagnosis not present

## 2021-04-02 DIAGNOSIS — Z992 Dependence on renal dialysis: Secondary | ICD-10-CM | POA: Diagnosis not present

## 2021-04-02 DIAGNOSIS — N186 End stage renal disease: Secondary | ICD-10-CM | POA: Diagnosis not present

## 2021-04-02 DIAGNOSIS — E876 Hypokalemia: Secondary | ICD-10-CM | POA: Diagnosis not present

## 2021-04-02 DIAGNOSIS — N2581 Secondary hyperparathyroidism of renal origin: Secondary | ICD-10-CM | POA: Diagnosis not present

## 2021-04-04 DIAGNOSIS — N2581 Secondary hyperparathyroidism of renal origin: Secondary | ICD-10-CM | POA: Diagnosis not present

## 2021-04-04 DIAGNOSIS — D689 Coagulation defect, unspecified: Secondary | ICD-10-CM | POA: Diagnosis not present

## 2021-04-04 DIAGNOSIS — N186 End stage renal disease: Secondary | ICD-10-CM | POA: Diagnosis not present

## 2021-04-04 DIAGNOSIS — Z992 Dependence on renal dialysis: Secondary | ICD-10-CM | POA: Diagnosis not present

## 2021-04-04 DIAGNOSIS — E876 Hypokalemia: Secondary | ICD-10-CM | POA: Diagnosis not present

## 2021-04-06 DIAGNOSIS — Z992 Dependence on renal dialysis: Secondary | ICD-10-CM | POA: Diagnosis not present

## 2021-04-06 DIAGNOSIS — N2581 Secondary hyperparathyroidism of renal origin: Secondary | ICD-10-CM | POA: Diagnosis not present

## 2021-04-06 DIAGNOSIS — D689 Coagulation defect, unspecified: Secondary | ICD-10-CM | POA: Diagnosis not present

## 2021-04-06 DIAGNOSIS — N186 End stage renal disease: Secondary | ICD-10-CM | POA: Diagnosis not present

## 2021-04-06 DIAGNOSIS — E876 Hypokalemia: Secondary | ICD-10-CM | POA: Diagnosis not present

## 2021-04-09 DIAGNOSIS — N186 End stage renal disease: Secondary | ICD-10-CM | POA: Diagnosis not present

## 2021-04-09 DIAGNOSIS — E876 Hypokalemia: Secondary | ICD-10-CM | POA: Diagnosis not present

## 2021-04-09 DIAGNOSIS — Z992 Dependence on renal dialysis: Secondary | ICD-10-CM | POA: Diagnosis not present

## 2021-04-09 DIAGNOSIS — D689 Coagulation defect, unspecified: Secondary | ICD-10-CM | POA: Diagnosis not present

## 2021-04-09 DIAGNOSIS — N2581 Secondary hyperparathyroidism of renal origin: Secondary | ICD-10-CM | POA: Diagnosis not present

## 2021-04-11 DIAGNOSIS — N2581 Secondary hyperparathyroidism of renal origin: Secondary | ICD-10-CM | POA: Diagnosis not present

## 2021-04-11 DIAGNOSIS — Z992 Dependence on renal dialysis: Secondary | ICD-10-CM | POA: Diagnosis not present

## 2021-04-11 DIAGNOSIS — E876 Hypokalemia: Secondary | ICD-10-CM | POA: Diagnosis not present

## 2021-04-11 DIAGNOSIS — D689 Coagulation defect, unspecified: Secondary | ICD-10-CM | POA: Diagnosis not present

## 2021-04-11 DIAGNOSIS — N186 End stage renal disease: Secondary | ICD-10-CM | POA: Diagnosis not present

## 2021-04-13 DIAGNOSIS — D689 Coagulation defect, unspecified: Secondary | ICD-10-CM | POA: Diagnosis not present

## 2021-04-13 DIAGNOSIS — Z992 Dependence on renal dialysis: Secondary | ICD-10-CM | POA: Diagnosis not present

## 2021-04-13 DIAGNOSIS — N186 End stage renal disease: Secondary | ICD-10-CM | POA: Diagnosis not present

## 2021-04-13 DIAGNOSIS — N2581 Secondary hyperparathyroidism of renal origin: Secondary | ICD-10-CM | POA: Diagnosis not present

## 2021-04-13 DIAGNOSIS — E876 Hypokalemia: Secondary | ICD-10-CM | POA: Diagnosis not present

## 2021-04-16 DIAGNOSIS — N2581 Secondary hyperparathyroidism of renal origin: Secondary | ICD-10-CM | POA: Diagnosis not present

## 2021-04-16 DIAGNOSIS — N186 End stage renal disease: Secondary | ICD-10-CM | POA: Diagnosis not present

## 2021-04-16 DIAGNOSIS — D689 Coagulation defect, unspecified: Secondary | ICD-10-CM | POA: Diagnosis not present

## 2021-04-16 DIAGNOSIS — Z992 Dependence on renal dialysis: Secondary | ICD-10-CM | POA: Diagnosis not present

## 2021-04-16 DIAGNOSIS — E876 Hypokalemia: Secondary | ICD-10-CM | POA: Diagnosis not present

## 2021-04-18 DIAGNOSIS — E876 Hypokalemia: Secondary | ICD-10-CM | POA: Diagnosis not present

## 2021-04-18 DIAGNOSIS — D689 Coagulation defect, unspecified: Secondary | ICD-10-CM | POA: Diagnosis not present

## 2021-04-18 DIAGNOSIS — Z992 Dependence on renal dialysis: Secondary | ICD-10-CM | POA: Diagnosis not present

## 2021-04-18 DIAGNOSIS — N2581 Secondary hyperparathyroidism of renal origin: Secondary | ICD-10-CM | POA: Diagnosis not present

## 2021-04-18 DIAGNOSIS — N186 End stage renal disease: Secondary | ICD-10-CM | POA: Diagnosis not present

## 2021-04-20 DIAGNOSIS — Z992 Dependence on renal dialysis: Secondary | ICD-10-CM | POA: Diagnosis not present

## 2021-04-20 DIAGNOSIS — N2581 Secondary hyperparathyroidism of renal origin: Secondary | ICD-10-CM | POA: Diagnosis not present

## 2021-04-20 DIAGNOSIS — E876 Hypokalemia: Secondary | ICD-10-CM | POA: Diagnosis not present

## 2021-04-20 DIAGNOSIS — N186 End stage renal disease: Secondary | ICD-10-CM | POA: Diagnosis not present

## 2021-04-20 DIAGNOSIS — D689 Coagulation defect, unspecified: Secondary | ICD-10-CM | POA: Diagnosis not present

## 2021-04-23 DIAGNOSIS — D689 Coagulation defect, unspecified: Secondary | ICD-10-CM | POA: Diagnosis not present

## 2021-04-23 DIAGNOSIS — E876 Hypokalemia: Secondary | ICD-10-CM | POA: Diagnosis not present

## 2021-04-23 DIAGNOSIS — N186 End stage renal disease: Secondary | ICD-10-CM | POA: Diagnosis not present

## 2021-04-23 DIAGNOSIS — Z992 Dependence on renal dialysis: Secondary | ICD-10-CM | POA: Diagnosis not present

## 2021-04-23 DIAGNOSIS — N2581 Secondary hyperparathyroidism of renal origin: Secondary | ICD-10-CM | POA: Diagnosis not present

## 2021-04-23 DIAGNOSIS — T8612 Kidney transplant failure: Secondary | ICD-10-CM | POA: Diagnosis not present

## 2021-04-25 DIAGNOSIS — Z992 Dependence on renal dialysis: Secondary | ICD-10-CM | POA: Diagnosis not present

## 2021-04-25 DIAGNOSIS — D509 Iron deficiency anemia, unspecified: Secondary | ICD-10-CM | POA: Diagnosis not present

## 2021-04-25 DIAGNOSIS — N186 End stage renal disease: Secondary | ICD-10-CM | POA: Diagnosis not present

## 2021-04-25 DIAGNOSIS — N2581 Secondary hyperparathyroidism of renal origin: Secondary | ICD-10-CM | POA: Diagnosis not present

## 2021-04-25 DIAGNOSIS — E876 Hypokalemia: Secondary | ICD-10-CM | POA: Diagnosis not present

## 2021-04-25 DIAGNOSIS — D689 Coagulation defect, unspecified: Secondary | ICD-10-CM | POA: Diagnosis not present

## 2021-04-27 DIAGNOSIS — D509 Iron deficiency anemia, unspecified: Secondary | ICD-10-CM | POA: Diagnosis not present

## 2021-04-27 DIAGNOSIS — E876 Hypokalemia: Secondary | ICD-10-CM | POA: Diagnosis not present

## 2021-04-27 DIAGNOSIS — N2581 Secondary hyperparathyroidism of renal origin: Secondary | ICD-10-CM | POA: Diagnosis not present

## 2021-04-27 DIAGNOSIS — D689 Coagulation defect, unspecified: Secondary | ICD-10-CM | POA: Diagnosis not present

## 2021-04-27 DIAGNOSIS — Z992 Dependence on renal dialysis: Secondary | ICD-10-CM | POA: Diagnosis not present

## 2021-04-27 DIAGNOSIS — N186 End stage renal disease: Secondary | ICD-10-CM | POA: Diagnosis not present

## 2021-04-30 DIAGNOSIS — N186 End stage renal disease: Secondary | ICD-10-CM | POA: Diagnosis not present

## 2021-04-30 DIAGNOSIS — N2581 Secondary hyperparathyroidism of renal origin: Secondary | ICD-10-CM | POA: Diagnosis not present

## 2021-04-30 DIAGNOSIS — E876 Hypokalemia: Secondary | ICD-10-CM | POA: Diagnosis not present

## 2021-04-30 DIAGNOSIS — Z992 Dependence on renal dialysis: Secondary | ICD-10-CM | POA: Diagnosis not present

## 2021-04-30 DIAGNOSIS — D689 Coagulation defect, unspecified: Secondary | ICD-10-CM | POA: Diagnosis not present

## 2021-04-30 DIAGNOSIS — D509 Iron deficiency anemia, unspecified: Secondary | ICD-10-CM | POA: Diagnosis not present

## 2021-05-02 DIAGNOSIS — N186 End stage renal disease: Secondary | ICD-10-CM | POA: Diagnosis not present

## 2021-05-02 DIAGNOSIS — E876 Hypokalemia: Secondary | ICD-10-CM | POA: Diagnosis not present

## 2021-05-02 DIAGNOSIS — N2581 Secondary hyperparathyroidism of renal origin: Secondary | ICD-10-CM | POA: Diagnosis not present

## 2021-05-02 DIAGNOSIS — Z992 Dependence on renal dialysis: Secondary | ICD-10-CM | POA: Diagnosis not present

## 2021-05-02 DIAGNOSIS — D689 Coagulation defect, unspecified: Secondary | ICD-10-CM | POA: Diagnosis not present

## 2021-05-02 DIAGNOSIS — D509 Iron deficiency anemia, unspecified: Secondary | ICD-10-CM | POA: Diagnosis not present

## 2021-05-04 DIAGNOSIS — D689 Coagulation defect, unspecified: Secondary | ICD-10-CM | POA: Diagnosis not present

## 2021-05-04 DIAGNOSIS — Z992 Dependence on renal dialysis: Secondary | ICD-10-CM | POA: Diagnosis not present

## 2021-05-04 DIAGNOSIS — N2581 Secondary hyperparathyroidism of renal origin: Secondary | ICD-10-CM | POA: Diagnosis not present

## 2021-05-04 DIAGNOSIS — N186 End stage renal disease: Secondary | ICD-10-CM | POA: Diagnosis not present

## 2021-05-04 DIAGNOSIS — D509 Iron deficiency anemia, unspecified: Secondary | ICD-10-CM | POA: Diagnosis not present

## 2021-05-04 DIAGNOSIS — E876 Hypokalemia: Secondary | ICD-10-CM | POA: Diagnosis not present

## 2021-05-07 DIAGNOSIS — Z992 Dependence on renal dialysis: Secondary | ICD-10-CM | POA: Diagnosis not present

## 2021-05-07 DIAGNOSIS — N2581 Secondary hyperparathyroidism of renal origin: Secondary | ICD-10-CM | POA: Diagnosis not present

## 2021-05-07 DIAGNOSIS — D689 Coagulation defect, unspecified: Secondary | ICD-10-CM | POA: Diagnosis not present

## 2021-05-07 DIAGNOSIS — D509 Iron deficiency anemia, unspecified: Secondary | ICD-10-CM | POA: Diagnosis not present

## 2021-05-07 DIAGNOSIS — N186 End stage renal disease: Secondary | ICD-10-CM | POA: Diagnosis not present

## 2021-05-07 DIAGNOSIS — E876 Hypokalemia: Secondary | ICD-10-CM | POA: Diagnosis not present

## 2021-05-09 DIAGNOSIS — D689 Coagulation defect, unspecified: Secondary | ICD-10-CM | POA: Diagnosis not present

## 2021-05-09 DIAGNOSIS — D509 Iron deficiency anemia, unspecified: Secondary | ICD-10-CM | POA: Diagnosis not present

## 2021-05-09 DIAGNOSIS — N2581 Secondary hyperparathyroidism of renal origin: Secondary | ICD-10-CM | POA: Diagnosis not present

## 2021-05-09 DIAGNOSIS — Z992 Dependence on renal dialysis: Secondary | ICD-10-CM | POA: Diagnosis not present

## 2021-05-09 DIAGNOSIS — N186 End stage renal disease: Secondary | ICD-10-CM | POA: Diagnosis not present

## 2021-05-09 DIAGNOSIS — E876 Hypokalemia: Secondary | ICD-10-CM | POA: Diagnosis not present

## 2021-05-11 DIAGNOSIS — E876 Hypokalemia: Secondary | ICD-10-CM | POA: Diagnosis not present

## 2021-05-11 DIAGNOSIS — D689 Coagulation defect, unspecified: Secondary | ICD-10-CM | POA: Diagnosis not present

## 2021-05-11 DIAGNOSIS — D509 Iron deficiency anemia, unspecified: Secondary | ICD-10-CM | POA: Diagnosis not present

## 2021-05-11 DIAGNOSIS — N186 End stage renal disease: Secondary | ICD-10-CM | POA: Diagnosis not present

## 2021-05-11 DIAGNOSIS — Z992 Dependence on renal dialysis: Secondary | ICD-10-CM | POA: Diagnosis not present

## 2021-05-11 DIAGNOSIS — N2581 Secondary hyperparathyroidism of renal origin: Secondary | ICD-10-CM | POA: Diagnosis not present

## 2021-05-14 DIAGNOSIS — D509 Iron deficiency anemia, unspecified: Secondary | ICD-10-CM | POA: Diagnosis not present

## 2021-05-14 DIAGNOSIS — E876 Hypokalemia: Secondary | ICD-10-CM | POA: Diagnosis not present

## 2021-05-14 DIAGNOSIS — N186 End stage renal disease: Secondary | ICD-10-CM | POA: Diagnosis not present

## 2021-05-14 DIAGNOSIS — D689 Coagulation defect, unspecified: Secondary | ICD-10-CM | POA: Diagnosis not present

## 2021-05-14 DIAGNOSIS — Z992 Dependence on renal dialysis: Secondary | ICD-10-CM | POA: Diagnosis not present

## 2021-05-14 DIAGNOSIS — N2581 Secondary hyperparathyroidism of renal origin: Secondary | ICD-10-CM | POA: Diagnosis not present

## 2021-05-16 DIAGNOSIS — D509 Iron deficiency anemia, unspecified: Secondary | ICD-10-CM | POA: Diagnosis not present

## 2021-05-16 DIAGNOSIS — E876 Hypokalemia: Secondary | ICD-10-CM | POA: Diagnosis not present

## 2021-05-16 DIAGNOSIS — Z992 Dependence on renal dialysis: Secondary | ICD-10-CM | POA: Diagnosis not present

## 2021-05-16 DIAGNOSIS — N2581 Secondary hyperparathyroidism of renal origin: Secondary | ICD-10-CM | POA: Diagnosis not present

## 2021-05-16 DIAGNOSIS — N186 End stage renal disease: Secondary | ICD-10-CM | POA: Diagnosis not present

## 2021-05-16 DIAGNOSIS — D689 Coagulation defect, unspecified: Secondary | ICD-10-CM | POA: Diagnosis not present

## 2021-05-18 DIAGNOSIS — N2581 Secondary hyperparathyroidism of renal origin: Secondary | ICD-10-CM | POA: Diagnosis not present

## 2021-05-18 DIAGNOSIS — D509 Iron deficiency anemia, unspecified: Secondary | ICD-10-CM | POA: Diagnosis not present

## 2021-05-18 DIAGNOSIS — D689 Coagulation defect, unspecified: Secondary | ICD-10-CM | POA: Diagnosis not present

## 2021-05-18 DIAGNOSIS — N186 End stage renal disease: Secondary | ICD-10-CM | POA: Diagnosis not present

## 2021-05-18 DIAGNOSIS — Z992 Dependence on renal dialysis: Secondary | ICD-10-CM | POA: Diagnosis not present

## 2021-05-18 DIAGNOSIS — E876 Hypokalemia: Secondary | ICD-10-CM | POA: Diagnosis not present

## 2021-05-21 DIAGNOSIS — E876 Hypokalemia: Secondary | ICD-10-CM | POA: Diagnosis not present

## 2021-05-21 DIAGNOSIS — D509 Iron deficiency anemia, unspecified: Secondary | ICD-10-CM | POA: Diagnosis not present

## 2021-05-21 DIAGNOSIS — T8612 Kidney transplant failure: Secondary | ICD-10-CM | POA: Diagnosis not present

## 2021-05-21 DIAGNOSIS — Z992 Dependence on renal dialysis: Secondary | ICD-10-CM | POA: Diagnosis not present

## 2021-05-21 DIAGNOSIS — N2581 Secondary hyperparathyroidism of renal origin: Secondary | ICD-10-CM | POA: Diagnosis not present

## 2021-05-21 DIAGNOSIS — N186 End stage renal disease: Secondary | ICD-10-CM | POA: Diagnosis not present

## 2021-05-21 DIAGNOSIS — D689 Coagulation defect, unspecified: Secondary | ICD-10-CM | POA: Diagnosis not present

## 2021-05-23 DIAGNOSIS — D689 Coagulation defect, unspecified: Secondary | ICD-10-CM | POA: Diagnosis not present

## 2021-05-23 DIAGNOSIS — Z992 Dependence on renal dialysis: Secondary | ICD-10-CM | POA: Diagnosis not present

## 2021-05-23 DIAGNOSIS — D509 Iron deficiency anemia, unspecified: Secondary | ICD-10-CM | POA: Diagnosis not present

## 2021-05-23 DIAGNOSIS — E876 Hypokalemia: Secondary | ICD-10-CM | POA: Diagnosis not present

## 2021-05-23 DIAGNOSIS — E875 Hyperkalemia: Secondary | ICD-10-CM | POA: Diagnosis not present

## 2021-05-23 DIAGNOSIS — I159 Secondary hypertension, unspecified: Secondary | ICD-10-CM | POA: Diagnosis not present

## 2021-05-23 DIAGNOSIS — N2581 Secondary hyperparathyroidism of renal origin: Secondary | ICD-10-CM | POA: Diagnosis not present

## 2021-05-23 DIAGNOSIS — N186 End stage renal disease: Secondary | ICD-10-CM | POA: Diagnosis not present

## 2021-05-25 DIAGNOSIS — N2581 Secondary hyperparathyroidism of renal origin: Secondary | ICD-10-CM | POA: Diagnosis not present

## 2021-05-25 DIAGNOSIS — D689 Coagulation defect, unspecified: Secondary | ICD-10-CM | POA: Diagnosis not present

## 2021-05-25 DIAGNOSIS — E876 Hypokalemia: Secondary | ICD-10-CM | POA: Diagnosis not present

## 2021-05-25 DIAGNOSIS — I159 Secondary hypertension, unspecified: Secondary | ICD-10-CM | POA: Diagnosis not present

## 2021-05-25 DIAGNOSIS — E875 Hyperkalemia: Secondary | ICD-10-CM | POA: Diagnosis not present

## 2021-05-25 DIAGNOSIS — N186 End stage renal disease: Secondary | ICD-10-CM | POA: Diagnosis not present

## 2021-05-25 DIAGNOSIS — Z992 Dependence on renal dialysis: Secondary | ICD-10-CM | POA: Diagnosis not present

## 2021-05-25 DIAGNOSIS — D509 Iron deficiency anemia, unspecified: Secondary | ICD-10-CM | POA: Diagnosis not present

## 2021-05-27 DIAGNOSIS — L309 Dermatitis, unspecified: Secondary | ICD-10-CM | POA: Diagnosis not present

## 2021-05-28 DIAGNOSIS — I159 Secondary hypertension, unspecified: Secondary | ICD-10-CM | POA: Diagnosis not present

## 2021-05-28 DIAGNOSIS — Z992 Dependence on renal dialysis: Secondary | ICD-10-CM | POA: Diagnosis not present

## 2021-05-28 DIAGNOSIS — N186 End stage renal disease: Secondary | ICD-10-CM | POA: Diagnosis not present

## 2021-05-28 DIAGNOSIS — E876 Hypokalemia: Secondary | ICD-10-CM | POA: Diagnosis not present

## 2021-05-28 DIAGNOSIS — D509 Iron deficiency anemia, unspecified: Secondary | ICD-10-CM | POA: Diagnosis not present

## 2021-05-28 DIAGNOSIS — N2581 Secondary hyperparathyroidism of renal origin: Secondary | ICD-10-CM | POA: Diagnosis not present

## 2021-05-28 DIAGNOSIS — E875 Hyperkalemia: Secondary | ICD-10-CM | POA: Diagnosis not present

## 2021-05-28 DIAGNOSIS — D689 Coagulation defect, unspecified: Secondary | ICD-10-CM | POA: Diagnosis not present

## 2021-05-30 DIAGNOSIS — N2581 Secondary hyperparathyroidism of renal origin: Secondary | ICD-10-CM | POA: Diagnosis not present

## 2021-05-30 DIAGNOSIS — Z992 Dependence on renal dialysis: Secondary | ICD-10-CM | POA: Diagnosis not present

## 2021-05-30 DIAGNOSIS — D509 Iron deficiency anemia, unspecified: Secondary | ICD-10-CM | POA: Diagnosis not present

## 2021-05-30 DIAGNOSIS — I159 Secondary hypertension, unspecified: Secondary | ICD-10-CM | POA: Diagnosis not present

## 2021-05-30 DIAGNOSIS — D689 Coagulation defect, unspecified: Secondary | ICD-10-CM | POA: Diagnosis not present

## 2021-05-30 DIAGNOSIS — E876 Hypokalemia: Secondary | ICD-10-CM | POA: Diagnosis not present

## 2021-05-30 DIAGNOSIS — E875 Hyperkalemia: Secondary | ICD-10-CM | POA: Diagnosis not present

## 2021-05-30 DIAGNOSIS — N186 End stage renal disease: Secondary | ICD-10-CM | POA: Diagnosis not present

## 2021-06-01 DIAGNOSIS — D509 Iron deficiency anemia, unspecified: Secondary | ICD-10-CM | POA: Diagnosis not present

## 2021-06-01 DIAGNOSIS — N2581 Secondary hyperparathyroidism of renal origin: Secondary | ICD-10-CM | POA: Diagnosis not present

## 2021-06-01 DIAGNOSIS — Z992 Dependence on renal dialysis: Secondary | ICD-10-CM | POA: Diagnosis not present

## 2021-06-01 DIAGNOSIS — E875 Hyperkalemia: Secondary | ICD-10-CM | POA: Diagnosis not present

## 2021-06-01 DIAGNOSIS — I159 Secondary hypertension, unspecified: Secondary | ICD-10-CM | POA: Diagnosis not present

## 2021-06-01 DIAGNOSIS — D689 Coagulation defect, unspecified: Secondary | ICD-10-CM | POA: Diagnosis not present

## 2021-06-01 DIAGNOSIS — E876 Hypokalemia: Secondary | ICD-10-CM | POA: Diagnosis not present

## 2021-06-01 DIAGNOSIS — N186 End stage renal disease: Secondary | ICD-10-CM | POA: Diagnosis not present

## 2021-06-04 DIAGNOSIS — N2581 Secondary hyperparathyroidism of renal origin: Secondary | ICD-10-CM | POA: Diagnosis not present

## 2021-06-04 DIAGNOSIS — N186 End stage renal disease: Secondary | ICD-10-CM | POA: Diagnosis not present

## 2021-06-04 DIAGNOSIS — D689 Coagulation defect, unspecified: Secondary | ICD-10-CM | POA: Diagnosis not present

## 2021-06-04 DIAGNOSIS — E876 Hypokalemia: Secondary | ICD-10-CM | POA: Diagnosis not present

## 2021-06-04 DIAGNOSIS — E875 Hyperkalemia: Secondary | ICD-10-CM | POA: Diagnosis not present

## 2021-06-04 DIAGNOSIS — Z992 Dependence on renal dialysis: Secondary | ICD-10-CM | POA: Diagnosis not present

## 2021-06-04 DIAGNOSIS — I159 Secondary hypertension, unspecified: Secondary | ICD-10-CM | POA: Diagnosis not present

## 2021-06-04 DIAGNOSIS — D509 Iron deficiency anemia, unspecified: Secondary | ICD-10-CM | POA: Diagnosis not present

## 2021-06-06 DIAGNOSIS — E876 Hypokalemia: Secondary | ICD-10-CM | POA: Diagnosis not present

## 2021-06-06 DIAGNOSIS — N2581 Secondary hyperparathyroidism of renal origin: Secondary | ICD-10-CM | POA: Diagnosis not present

## 2021-06-06 DIAGNOSIS — I159 Secondary hypertension, unspecified: Secondary | ICD-10-CM | POA: Diagnosis not present

## 2021-06-06 DIAGNOSIS — D509 Iron deficiency anemia, unspecified: Secondary | ICD-10-CM | POA: Diagnosis not present

## 2021-06-06 DIAGNOSIS — N186 End stage renal disease: Secondary | ICD-10-CM | POA: Diagnosis not present

## 2021-06-06 DIAGNOSIS — E875 Hyperkalemia: Secondary | ICD-10-CM | POA: Diagnosis not present

## 2021-06-06 DIAGNOSIS — Z992 Dependence on renal dialysis: Secondary | ICD-10-CM | POA: Diagnosis not present

## 2021-06-06 DIAGNOSIS — D689 Coagulation defect, unspecified: Secondary | ICD-10-CM | POA: Diagnosis not present

## 2021-06-08 ENCOUNTER — Encounter (HOSPITAL_COMMUNITY): Payer: Self-pay

## 2021-06-08 ENCOUNTER — Emergency Department (HOSPITAL_COMMUNITY)
Admission: EM | Admit: 2021-06-08 | Discharge: 2021-06-08 | Disposition: A | Discharge status: Home / Self Care | Payer: Medicare Other | Attending: Emergency Medicine | Admitting: Emergency Medicine

## 2021-06-08 DIAGNOSIS — Z7982 Long term (current) use of aspirin: Secondary | ICD-10-CM | POA: Insufficient documentation

## 2021-06-08 DIAGNOSIS — Z992 Dependence on renal dialysis: Secondary | ICD-10-CM | POA: Insufficient documentation

## 2021-06-08 DIAGNOSIS — N186 End stage renal disease: Secondary | ICD-10-CM | POA: Insufficient documentation

## 2021-06-08 DIAGNOSIS — Z79899 Other long term (current) drug therapy: Secondary | ICD-10-CM | POA: Diagnosis not present

## 2021-06-08 DIAGNOSIS — E876 Hypokalemia: Secondary | ICD-10-CM | POA: Diagnosis not present

## 2021-06-08 DIAGNOSIS — E875 Hyperkalemia: Secondary | ICD-10-CM | POA: Diagnosis not present

## 2021-06-08 DIAGNOSIS — I132 Hypertensive heart and chronic kidney disease with heart failure and with stage 5 chronic kidney disease, or end stage renal disease: Secondary | ICD-10-CM | POA: Insufficient documentation

## 2021-06-08 DIAGNOSIS — I509 Heart failure, unspecified: Secondary | ICD-10-CM | POA: Diagnosis not present

## 2021-06-08 DIAGNOSIS — D509 Iron deficiency anemia, unspecified: Secondary | ICD-10-CM | POA: Diagnosis not present

## 2021-06-08 DIAGNOSIS — T7840XA Allergy, unspecified, initial encounter: Secondary | ICD-10-CM

## 2021-06-08 DIAGNOSIS — D689 Coagulation defect, unspecified: Secondary | ICD-10-CM | POA: Diagnosis not present

## 2021-06-08 DIAGNOSIS — N2581 Secondary hyperparathyroidism of renal origin: Secondary | ICD-10-CM | POA: Diagnosis not present

## 2021-06-08 DIAGNOSIS — I159 Secondary hypertension, unspecified: Secondary | ICD-10-CM | POA: Diagnosis not present

## 2021-06-08 MED ORDER — HYDRALAZINE HCL 10 MG PO TABS
10.0000 mg | ORAL_TABLET | Freq: Once | ORAL | Status: AC
  Administered 2021-06-08: 10 mg via ORAL
  Filled 2021-06-08: qty 1

## 2021-06-08 MED ORDER — PREDNISONE 10 MG PO TABS: 20.0000 mg | ORAL_TABLET | Freq: Every day | ORAL | 0 refills | Status: AC

## 2021-06-08 MED ORDER — DIPHENHYDRAMINE HCL 25 MG PO CAPS
25.0000 mg | ORAL_CAPSULE | Freq: Once | ORAL | Status: DC
  Filled 2021-06-08: qty 1

## 2021-06-08 MED ORDER — CETIRIZINE HCL 10 MG PO CAPS: 10.0000 mg | ORAL_CAPSULE | ORAL | 0 refills | Status: DC

## 2021-06-08 MED ORDER — HYDRALAZINE HCL 20 MG/ML IJ SOLN: 10.0000 mg | Freq: Once | INTRAMUSCULAR | Status: DC

## 2021-06-08 MED ORDER — EPINEPHRINE 0.3 MG/0.3ML IJ SOAJ: 0.3000 mg | INTRAMUSCULAR | 0 refills | Status: DC | PRN

## 2021-06-08 MED ORDER — PREDNISONE 20 MG PO TABS
20.0000 mg | ORAL_TABLET | Freq: Once | ORAL | Status: AC
  Administered 2021-06-08: 20 mg via ORAL
  Filled 2021-06-08: qty 1

## 2021-06-08 MED ORDER — FAMOTIDINE 20 MG PO TABS
20.0000 mg | ORAL_TABLET | Freq: Once | ORAL | Status: AC
  Administered 2021-06-08: 20 mg via ORAL
  Filled 2021-06-08: qty 1

## 2021-06-08 NOTE — ED Triage Notes (Signed)
Pt states that 1 week ago he had allergic reaction to unknown. Pt states he went to UC, received steroids and lotion which helped. Pt states that it returned today. Pt have hives on chest and face, as well as itching in chest ?

## 2021-06-08 NOTE — ED Provider Notes (Signed)
?[facility] DEPT ?Provider Note ? ? ?CSN: [id number] ?Arrival date & time: 06/08/21  1030 ? ?  ? ?History ? ?Chief Complaint  ?Patient presents with  ? Allergic Reaction  ? ? ?Ryan [name] is a 61 y.o. male with a past medical history of hypertension, ESRD on dialysis and CHF presenting today with a concern for an allergic reaction.  Reports its been going on for a week.  Changed his laundry detergent to a cheaper 1 and the next morning noted hives on his body.  Was seen at urgent care few days ago, given a steroid shot and hydrocortisone cream.  Says that shot helped him however he continues to have symptoms.  Denies any shortness of breath or chest tightness.  No difficulty breathing. He has no known allergies.  He has had Benadryl every now and then but says it is not really helping and the symptoms keep coming back. ? ? ?Home Medications ?Prior to Admission medications   ?Medication Sig Start Date End Date Taking? Authorizing Provider  ?Cetirizine HCl 10 MG CAPS Take 1 capsule (10 mg total) by mouth 1 day or 1 dose. 06/08/21 07/08/21 Yes Gardenia Witter A, PA-C  ?EPINEPHrine 0.3 mg/0.3 mL IJ SOAJ injection Inject 0.3 mg into the muscle as needed for anaphylaxis. 06/08/21  Yes Quinisha Mould A, PA-C  ?predniSONE (DELTASONE) 10 MG tablet Take 2 tablets (20 mg total) by mouth daily for 5 days. 06/08/21 06/13/21 Yes Moises Terpstra A, PA-C  ?aspirin 325 MG tablet Take 325 mg by mouth daily.    [provider]  ?cephALEXin (KEFLEX) 500 MG capsule Take 1 capsule (500 mg total) by mouth 2 (two) times daily. 08/27/20   Ulyses Amor, PA-C  ?Cholecalciferol (VITAMIN D3) 125 MCG (5000 UT) CAPS Take 5,000 Units by mouth daily.    [provider]  ?cinacalcet (SENSIPAR) 30 MG tablet Take 30 mg by mouth at bedtime. 12/08/17   [provider]  ?cloNIDine (CATAPRES) 0.1 MG tablet Take 1 tablet (0.1 mg total) by mouth 2 (two) times daily. ?Patient not taking: No sig  reported 09/24/17   Kerney Elbe, DO  ?Ferrous Sulfate (IRON) 325 (65 Fe) MG TABS Take 325 mg by mouth at bedtime.    [provider]  ?furosemide (LASIX) 80 MG tablet TAKE 1 TABLET (80 MG TOTAL) BY MOUTH 2 (TWO) TIMES DAILY. ?Patient not taking: No sig reported 03/29/18   Orma Flaming, MD  ?labetalol (NORMODYNE) 300 MG tablet TAKE 1 TABLET (300 MG TOTAL) BY MOUTH 2 (TWO) TIMES DAILY. OFFICE VISIT NEEDED ?Patient not taking: No sig reported 11/21/18   Leonie Man, MD  ?[facility] 10 g PACK packet Take 1 packet by mouth at bedtime. 02/01/19   [provider]  ?losartan (COZAAR) 50 MG tablet Take 1 tablet (50 mg total) by mouth daily. ?Patient not taking: No sig reported 04/12/18   Leonie Man, MD  ?multivitamin (RENA-VIT) TABS tablet Take 1 tablet by mouth daily.    [provider]  ?NIFEdipine (PROCARDIA-XL/NIFEDICAL-XL) 30 MG 24 hr tablet Take 30 mg by mouth at bedtime.    [provider]  ?omeprazole (PRILOSEC) 20 MG capsule TAKE 1 CAPSULE BY MOUTH EVERY DAY ?Patient not taking: No sig reported 08/10/18   Marshell Garfinkel, MD  ?OVER THE COUNTER MEDICATION Take 1 tablet by mouth daily. Zinc and Copper    [provider]  ?oxyCODONE (OXY IR/ROXICODONE) 5 MG immediate release tablet Take 1 tablet (5 mg  total) by mouth every 4 (four) hours as needed (for pain score of 1-4). 08/27/20   Ulyses Amor, PA-C  ?pravastatin (PRAVACHOL) 80 MG tablet Take 1 tablet (80 mg total) by mouth at bedtime. ?Patient not taking: No sig reported 12/02/17   Orma Flaming, MD  ?sevelamer carbonate (RENVELA) 800 MG tablet Take 1 tablet (800 mg total) by mouth 3 (three) times daily with meals. ?Patient taking differently: Take 800-1,600 mg by mouth See admin instructions. Take 2 tablets by mouth with each meal and 1 tablet with snacks 09/24/17   Raiford Noble Latif, DO  ?tetrahydrozoline-zinc (VISINE-AC) 0.05-0.25 % ophthalmic solution Place 2 drops into both eyes 3 (three) times daily as  needed (dry eyes).    [provider]  ?triamcinolone cream (KENALOG) 0.1 % Apply 1 application topically daily as needed (dry spots on face/excema).    [provider]  ?   ? ?Allergies    ?Azithromycin   ? ?Review of Systems   ?Review of Systems ?See HPI ? ?Physical Exam ?Updated Vital Signs ?BP (!) 168/103 (BP Location: Right Arm)   Pulse 84   Temp (!) 97.5 ?F (36.4 ?C) (Oral)   Resp 14   Ht 5\' 10"  (1.778 m)   Wt 86.2 kg   SpO2 100%   BMI 27.26 kg/m?  ?Physical Exam ?Vitals and nursing note reviewed.  ?Constitutional:   ?   Appearance: Normal appearance.  ?HENT:  ?   Head: Normocephalic and atraumatic.  ?   Mouth/Throat:  ?   Mouth: Mucous membranes are moist.  ?   Pharynx: Oropharynx is clear.  ?Eyes:  ?   General: No scleral icterus. ?   Conjunctiva/sclera: Conjunctivae normal.  ?Cardiovascular:  ?   Rate and Rhythm: Normal rate and regular rhythm.  ?Pulmonary:  ?   Effort: Pulmonary effort is normal. No respiratory distress.  ?   Breath sounds: No wheezing.  ?Skin: ?   Findings: Rash present.  ?   Comments: Patient with some urticaria to his anterior chest and forehead.  No other areas of involvement.  Airway clear, tolerating secretions.  No wheezing on auscultation.  ?Neurological:  ?   Mental Status: He is alert.  ?Psychiatric:     ?   Mood and Affect: Mood normal.  ? ? ?ED Results / Procedures / Treatments   ?Labs ?(all labs ordered are listed, but only abnormal results are displayed) ?Labs Reviewed - No data to display ? ?EKG ?None ? ?Radiology ?No results found. ? ?Procedures ?Procedures  ? ?Medications Ordered in ED ?Medications  ?diphenhydrAMINE (BENADRYL) capsule 25 mg (25 mg Oral Not Given 06/08/21 1142)  ?predniSONE (DELTASONE) tablet 20 mg (20 mg Oral Given 06/08/21 1142)  ?famotidine (PEPCID) tablet 20 mg (20 mg Oral Given 06/08/21 1142)  ?hydrALAZINE (APRESOLINE) tablet 10 mg (10 mg Oral Given 06/08/21 1145)  ? ? ?ED Course/ Medical Decision Making/ A&P ?  ?                         ?Medical Decision Making ?Risk ?OTC drugs. ?Prescription drug management. ? ? ?61 year old male presenting with a concern of an allergic reaction to new laundry detergent.  Was seen at urgent care and says that the symptoms persist. ? ?Physical exam reassuring.  Some urticaria to anterior face and chest however no signs of anaphylaxis. ? ?Treatment: Patient given Pepcid, prednisone, Benadryl and hydralazine for elevated blood pressure. ? ?MDM/disposition: I will send the patient home  with 5 days worth of prednisone.  He will follow-up with his primary care provider.  He was also given a prescription for Zyrtec however he understands he can buy this over-the-counter.  We discussed histamine reactions and the utilization of antihistamine medications.  We also discussed anaphylaxis and he will return accordingly.  EpiPen also sent to his pharmacy. ? ?Final Clinical Impression(s) / ED Diagnoses ?Final diagnoses:  ?Allergic reaction, initial encounter  ? ? ?Rx / DC Orders ?ED Discharge Orders   ? ?      Ordered  ?  Cetirizine HCl 10 MG CAPS  1 Day/Dose       ? 06/08/21 1127  ?  predniSONE (DELTASONE) 10 MG tablet  Daily       ? 06/08/21 1127  ?  EPINEPHrine 0.3 mg/0.3 mL IJ SOAJ injection  As needed       ? 06/08/21 1127  ? ?  ?  ? ?  ? ?Results and diagnoses were explained to the patient. Return precautions discussed in full. Patient had no additional questions and expressed complete understanding. ? ? ?This chart was dictated using voice recognition software.  Despite best efforts to proofread,  errors can occur which can change the documentation meaning.  ?  ?Rhae Hammock, PA-C ?06/08/21 1220 ? ?  ?Daleen Bo, MD ?06/08/21 1615 ? ?

## 2021-06-08 NOTE — Discharge Instructions (Addendum)
Cetirizine is a same as Zyrtec.  This is a daily medication medication take until you can follow-up with your primary care provider. ? ?Prednisone is a steroid.  You have been given 1 dose of this today, do not start the next dose until tomorrow ? ?I have also sent an EpiPen to the pharmacy in the event that you begin to feel your throat closing or develop difficulty breathing.  Please do not use your new laundry detergent anymore.  It is a good idea to rewash all of your clothing and sheets. ?

## 2021-06-11 DIAGNOSIS — D689 Coagulation defect, unspecified: Secondary | ICD-10-CM | POA: Diagnosis not present

## 2021-06-11 DIAGNOSIS — Z992 Dependence on renal dialysis: Secondary | ICD-10-CM | POA: Diagnosis not present

## 2021-06-11 DIAGNOSIS — N186 End stage renal disease: Secondary | ICD-10-CM | POA: Diagnosis not present

## 2021-06-11 DIAGNOSIS — I159 Secondary hypertension, unspecified: Secondary | ICD-10-CM | POA: Diagnosis not present

## 2021-06-11 DIAGNOSIS — E876 Hypokalemia: Secondary | ICD-10-CM | POA: Diagnosis not present

## 2021-06-11 DIAGNOSIS — D509 Iron deficiency anemia, unspecified: Secondary | ICD-10-CM | POA: Diagnosis not present

## 2021-06-11 DIAGNOSIS — N2581 Secondary hyperparathyroidism of renal origin: Secondary | ICD-10-CM | POA: Diagnosis not present

## 2021-06-11 DIAGNOSIS — E875 Hyperkalemia: Secondary | ICD-10-CM | POA: Diagnosis not present

## 2021-06-13 DIAGNOSIS — I159 Secondary hypertension, unspecified: Secondary | ICD-10-CM | POA: Diagnosis not present

## 2021-06-13 DIAGNOSIS — D689 Coagulation defect, unspecified: Secondary | ICD-10-CM | POA: Diagnosis not present

## 2021-06-13 DIAGNOSIS — N2581 Secondary hyperparathyroidism of renal origin: Secondary | ICD-10-CM | POA: Diagnosis not present

## 2021-06-13 DIAGNOSIS — E875 Hyperkalemia: Secondary | ICD-10-CM | POA: Diagnosis not present

## 2021-06-13 DIAGNOSIS — E876 Hypokalemia: Secondary | ICD-10-CM | POA: Diagnosis not present

## 2021-06-13 DIAGNOSIS — Z992 Dependence on renal dialysis: Secondary | ICD-10-CM | POA: Diagnosis not present

## 2021-06-13 DIAGNOSIS — N186 End stage renal disease: Secondary | ICD-10-CM | POA: Diagnosis not present

## 2021-06-13 DIAGNOSIS — D509 Iron deficiency anemia, unspecified: Secondary | ICD-10-CM | POA: Diagnosis not present

## 2021-06-15 DIAGNOSIS — Z992 Dependence on renal dialysis: Secondary | ICD-10-CM | POA: Diagnosis not present

## 2021-06-15 DIAGNOSIS — N2581 Secondary hyperparathyroidism of renal origin: Secondary | ICD-10-CM | POA: Diagnosis not present

## 2021-06-15 DIAGNOSIS — E876 Hypokalemia: Secondary | ICD-10-CM | POA: Diagnosis not present

## 2021-06-15 DIAGNOSIS — I159 Secondary hypertension, unspecified: Secondary | ICD-10-CM | POA: Diagnosis not present

## 2021-06-15 DIAGNOSIS — N186 End stage renal disease: Secondary | ICD-10-CM | POA: Diagnosis not present

## 2021-06-15 DIAGNOSIS — D689 Coagulation defect, unspecified: Secondary | ICD-10-CM | POA: Diagnosis not present

## 2021-06-15 DIAGNOSIS — E875 Hyperkalemia: Secondary | ICD-10-CM | POA: Diagnosis not present

## 2021-06-15 DIAGNOSIS — D509 Iron deficiency anemia, unspecified: Secondary | ICD-10-CM | POA: Diagnosis not present

## 2021-06-18 DIAGNOSIS — I159 Secondary hypertension, unspecified: Secondary | ICD-10-CM | POA: Diagnosis not present

## 2021-06-18 DIAGNOSIS — E876 Hypokalemia: Secondary | ICD-10-CM | POA: Diagnosis not present

## 2021-06-18 DIAGNOSIS — E875 Hyperkalemia: Secondary | ICD-10-CM | POA: Diagnosis not present

## 2021-06-18 DIAGNOSIS — D509 Iron deficiency anemia, unspecified: Secondary | ICD-10-CM | POA: Diagnosis not present

## 2021-06-18 DIAGNOSIS — Z992 Dependence on renal dialysis: Secondary | ICD-10-CM | POA: Diagnosis not present

## 2021-06-18 DIAGNOSIS — D689 Coagulation defect, unspecified: Secondary | ICD-10-CM | POA: Diagnosis not present

## 2021-06-18 DIAGNOSIS — N2581 Secondary hyperparathyroidism of renal origin: Secondary | ICD-10-CM | POA: Diagnosis not present

## 2021-06-18 DIAGNOSIS — N186 End stage renal disease: Secondary | ICD-10-CM | POA: Diagnosis not present

## 2021-06-20 DIAGNOSIS — N186 End stage renal disease: Secondary | ICD-10-CM | POA: Diagnosis not present

## 2021-06-20 DIAGNOSIS — E875 Hyperkalemia: Secondary | ICD-10-CM | POA: Diagnosis not present

## 2021-06-20 DIAGNOSIS — D509 Iron deficiency anemia, unspecified: Secondary | ICD-10-CM | POA: Diagnosis not present

## 2021-06-20 DIAGNOSIS — D689 Coagulation defect, unspecified: Secondary | ICD-10-CM | POA: Diagnosis not present

## 2021-06-20 DIAGNOSIS — I159 Secondary hypertension, unspecified: Secondary | ICD-10-CM | POA: Diagnosis not present

## 2021-06-20 DIAGNOSIS — N2581 Secondary hyperparathyroidism of renal origin: Secondary | ICD-10-CM | POA: Diagnosis not present

## 2021-06-20 DIAGNOSIS — Z992 Dependence on renal dialysis: Secondary | ICD-10-CM | POA: Diagnosis not present

## 2021-06-20 DIAGNOSIS — E876 Hypokalemia: Secondary | ICD-10-CM | POA: Diagnosis not present

## 2021-06-21 DIAGNOSIS — N186 End stage renal disease: Secondary | ICD-10-CM | POA: Diagnosis not present

## 2021-06-21 DIAGNOSIS — Z992 Dependence on renal dialysis: Secondary | ICD-10-CM | POA: Diagnosis not present

## 2021-06-21 DIAGNOSIS — T8612 Kidney transplant failure: Secondary | ICD-10-CM | POA: Diagnosis not present

## 2021-07-18 ENCOUNTER — Ambulatory Visit
Admission: EM | Admit: 2021-07-18 | Discharge: 2021-07-18 | Disposition: A | Discharge status: Home / Self Care | Payer: Medicare Other

## 2021-07-18 NOTE — ED Provider Notes (Signed)
Patient presents urgent care today requesting refills of his blood pressure medications but does not have his med list with them at this time.  Patient states he is a dialysis patient.  Patient states he reached out to his nephrologist for refills but they stated they were unable to see him until June.  EMR reviewed, patient does not have any medical history past December 2022.  I am unable to see any of his recent labs.  Patient does have elevated blood pressure today.  Patient is otherwise well-appearing.  Patient was advised to go to the emergency room so that labs to evaluate his kidney function, protein levels, blood counts could be performed in a more timely fashion that we can provide for him here to urgent care so that blood pressure medication can be renewed for him soon as possible.  Patient was agreeable to this plan. ?  ?Lynden Oxford Scales, PA-C ?07/18/21 1425 ? ?

## 2021-07-18 NOTE — ED Triage Notes (Addendum)
Pt is a dialysis patient, he states he has been having elevated blood pressures but is not taking any of the medications prescribed for HTN.  ?

## 2021-07-18 NOTE — ED Notes (Signed)
Patient is being discharged from the Urgent Care and sent to the Emergency Department via [facility]. Per L. Morgan-Scales PA-C ?, patient is in need of higher level of care due to need for further evaluation and lab work. Patient is aware and verbalizes understanding of plan of care.  ?Vitals:  ? 07/18/21 1357  ?BP: (!) 160/98  ?Pulse: 98  ?Resp: 18  ?Temp: 98 ?F (36.7 ?C)  ?SpO2: 98%  ?  ?

## 2021-07-19 ENCOUNTER — Encounter (HOSPITAL_BASED_OUTPATIENT_CLINIC_OR_DEPARTMENT_OTHER): Payer: Self-pay

## 2021-07-19 ENCOUNTER — Other Ambulatory Visit (HOSPITAL_BASED_OUTPATIENT_CLINIC_OR_DEPARTMENT_OTHER): Payer: Self-pay

## 2021-07-19 ENCOUNTER — Other Ambulatory Visit: Payer: Self-pay

## 2021-07-19 ENCOUNTER — Emergency Department (HOSPITAL_BASED_OUTPATIENT_CLINIC_OR_DEPARTMENT_OTHER)
Admission: EM | Admit: 2021-07-19 | Discharge: 2021-07-19 | Disposition: A | Discharge status: Home / Self Care | Payer: Medicare Other | Attending: Emergency Medicine | Admitting: Emergency Medicine

## 2021-07-19 DIAGNOSIS — Z79899 Other long term (current) drug therapy: Secondary | ICD-10-CM | POA: Diagnosis not present

## 2021-07-19 DIAGNOSIS — I132 Hypertensive heart and chronic kidney disease with heart failure and with stage 5 chronic kidney disease, or end stage renal disease: Secondary | ICD-10-CM | POA: Insufficient documentation

## 2021-07-19 DIAGNOSIS — Z992 Dependence on renal dialysis: Secondary | ICD-10-CM | POA: Insufficient documentation

## 2021-07-19 DIAGNOSIS — E1122 Type 2 diabetes mellitus with diabetic chronic kidney disease: Secondary | ICD-10-CM | POA: Diagnosis not present

## 2021-07-19 DIAGNOSIS — Z7982 Long term (current) use of aspirin: Secondary | ICD-10-CM | POA: Diagnosis not present

## 2021-07-19 DIAGNOSIS — I5032 Chronic diastolic (congestive) heart failure: Secondary | ICD-10-CM | POA: Insufficient documentation

## 2021-07-19 DIAGNOSIS — N186 End stage renal disease: Secondary | ICD-10-CM | POA: Insufficient documentation

## 2021-07-19 DIAGNOSIS — I10 Essential (primary) hypertension: Secondary | ICD-10-CM

## 2021-07-19 MED ORDER — ATENOLOL 25 MG PO TABS
ORAL_TABLET | ORAL | 0 refills | Status: DC
  Filled 2021-07-19: qty 30, 70d supply, fill #0

## 2021-07-19 MED ORDER — HYDRALAZINE HCL 10 MG PO TABS
10.0000 mg | ORAL_TABLET | Freq: Once | ORAL | Status: AC
  Administered 2021-07-19: 10 mg via ORAL
  Filled 2021-07-19: qty 1

## 2021-07-19 NOTE — ED Provider Notes (Signed)
?[facility] EMERGENCY DEPT ?Provider Note ? ? ?CSN: [id number] ?Arrival date & time: 07/19/21  1437 ? ?  ? ?History ? ?Chief Complaint  ?Patient presents with  ? Hypertension  ? ? ?Ryan [name] is a 61 y.o. male. ? ?HPI ? ?  ? ? ?4/11 has had more elevated blood pressures ?Dialysis T/Th/Saturday, no known changes ?Changed diet, started losing weight, but now gone back to chicken. Lost weight, but still using the same dry weight at dialysis. He began to have low BP so was taken off of clonidine and nifedipine ?Then put on amlodipine 10mg  4/4 , after that change from clonidine/nifedipine to amlodipine his BP have been elevated.  ? ?Denies numbness, weakness, difficulty talking or walking, visual changes or facial droop, CP, dyspnea, headaches, dizziness. ? ?Dr. Council Mechanic into dialysis, discussed it, has been on it a few weeks now and is not getting better.   ? ?Has not been able to see PCP, can't get him in until June, Nephrologist has been the one consulting about it and managing is bp. Went to UC and sent here. ? ?Past Medical History:  ?Diagnosis Date  ? Allergy   ? Anemia   ? Blood transfusion without reported diagnosis   ? Dialysis patient [facility])   ? Tues,thurs,sat  ? ESRD (end stage renal disease) ([place])   ? TTHS-   ? Family history of adverse reaction to anesthesia   ? father had allergic reaction with anesthesia with a dental procedure-(pt. doesn't know)  ? Hyperlipidemia   ? Hypertension   ?  ?  ?Home Medications ?Prior to Admission medications   ?Medication Sig Start Date End Date Taking? Authorizing Provider  ?atenolol (TENORMIN) 25 MG tablet To take 3 times weekly after dialysis 07/19/21  Yes Gareth Morgan, MD  ?aspirin 325 MG tablet Take 325 mg by mouth daily.    [provider]  ?cephALEXin (KEFLEX) 500 MG capsule Take 1 capsule (500 mg total) by mouth 2 (two) times daily. 08/27/20   Ulyses Amor, PA-C  ?Cetirizine HCl 10 MG CAPS Take 1 capsule (10 mg total) by mouth 1 day or 1  dose. 06/08/21 07/08/21  Redwine, Madison A, PA-C  ?Cholecalciferol (VITAMIN D3) 125 MCG (5000 UT) CAPS Take 5,000 Units by mouth daily.    [provider]  ?cinacalcet (SENSIPAR) 30 MG tablet Take 30 mg by mouth at bedtime. 12/08/17   [provider]  ?EPINEPHrine 0.3 mg/0.3 mL IJ SOAJ injection Inject 0.3 mg into the muscle as needed for anaphylaxis. 06/08/21   Redwine, Madison A, PA-C  ?Ferrous Sulfate (IRON) 325 (65 Fe) MG TABS Take 325 mg by mouth at bedtime.    [provider]  ?furosemide (LASIX) 80 MG tablet TAKE 1 TABLET (80 MG TOTAL) BY MOUTH 2 (TWO) TIMES DAILY. ?Patient not taking: No sig reported 03/29/18   Orma Flaming, MD  ?[facility] 10 g PACK packet Take 1 packet by mouth at bedtime. 02/01/19   [provider]  ?multivitamin (RENA-VIT) TABS tablet Take 1 tablet by mouth daily.    [provider]  ?omeprazole (PRILOSEC) 20 MG capsule TAKE 1 CAPSULE BY MOUTH EVERY DAY ?Patient not taking: No sig reported 08/10/18   Marshell Garfinkel, MD  ?OVER THE COUNTER MEDICATION Take 1 tablet by mouth daily. Zinc and Copper    [provider]  ?oxyCODONE (OXY IR/ROXICODONE) 5 MG immediate release tablet Take 1 tablet (5 mg total) by mouth every 4 (four) hours as needed (for pain score  of 1-4). 08/27/20   Ulyses Amor, PA-C  ?pravastatin (PRAVACHOL) 80 MG tablet Take 1 tablet (80 mg total) by mouth at bedtime. ?Patient not taking: No sig reported 12/02/17   Orma Flaming, MD  ?sevelamer carbonate (RENVELA) 800 MG tablet Take 1 tablet (800 mg total) by mouth 3 (three) times daily with meals. ?Patient taking differently: Take 800-1,600 mg by mouth See admin instructions. Take 2 tablets by mouth with each meal and 1 tablet with snacks 09/24/17   Raiford Noble Latif, DO  ?tetrahydrozoline-zinc (VISINE-AC) 0.05-0.25 % ophthalmic solution Place 2 drops into both eyes 3 (three) times daily as needed (dry eyes).    [provider]  ?triamcinolone cream (KENALOG) 0.1 %  Apply 1 application topically daily as needed (dry spots on face/excema).    [provider]  ?   ? ?Allergies    ?Azithromycin   ? ?Review of Systems   ?Review of Systems ? ?Physical Exam ?Updated Vital Signs ?BP (!) 167/106   Pulse 97   Temp 97.6 ?F (36.4 ?C) (Oral)   Resp (!) 24   SpO2 98%  ?Physical Exam ?Vitals and nursing note reviewed.  ?Constitutional:   ?   General: He is not in acute distress. ?   Appearance: Normal appearance. He is well-developed. He is not ill-appearing or diaphoretic.  ?HENT:  ?   Head: Normocephalic and atraumatic.  ?Eyes:  ?   General: No visual field deficit. ?   Extraocular Movements: Extraocular movements intact.  ?   Conjunctiva/sclera: Conjunctivae normal.  ?   Pupils: Pupils are equal, round, and reactive to light.  ?Cardiovascular:  ?   Rate and Rhythm: Normal rate and regular rhythm.  ?   Pulses: Normal pulses.  ?   Heart sounds: Normal heart sounds. No murmur heard. ?  No friction rub. No gallop.  ?Pulmonary:  ?   Effort: Pulmonary effort is normal. No respiratory distress.  ?   Breath sounds: Normal breath sounds. No wheezing or rales.  ?Abdominal:  ?   General: There is no distension.  ?   Palpations: Abdomen is soft.  ?   Tenderness: There is no abdominal tenderness. There is no guarding.  ?Musculoskeletal:     ?   General: No swelling or tenderness.  ?   Cervical back: Normal range of motion.  ?Skin: ?   General: Skin is warm and dry.  ?   Findings: No erythema or rash.  ?Neurological:  ?   General: No focal deficit present.  ?   Mental Status: He is alert and oriented to person, place, and time.  ?   GCS: GCS eye subscore is 4. GCS verbal subscore is 5. GCS motor subscore is 6.  ?   Cranial Nerves: No cranial nerve deficit, dysarthria or facial asymmetry.  ?   Sensory: No sensory deficit.  ?   Motor: No weakness or tremor.  ?   Coordination: Coordination normal. Finger-Nose-Finger Test normal.  ?   Gait: Gait normal.  ? ? ?ED Results / Procedures /  Treatments   ?Labs ?(all labs ordered are listed, but only abnormal results are displayed) ?Labs Reviewed - No data to display ? ?EKG ?None ? ?Radiology ?No results found. ? ?Procedures ?Procedures  ? ? ?Medications Ordered in ED ?Medications  ?hydrALAZINE (APRESOLINE) tablet 10 mg (10 mg Oral Given 07/19/21 1632)  ? ? ?ED Course/ Medical Decision Making/ A&P ?  ?                        ?  Medical Decision Making ?Risk ?Prescription drug management. ? ? 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   ? ?Patient without headache, no neurologic symptoms, no chest pain, no shortness of breath and have low suspicion for hypertensive emergencies including low suspicion for [facility], hypertensive encephalopathy, stroke, MI, aortic dissection, pulmonary edema.  Had dialysis as scheduled yesterday and doubt acute electrolyte abnormality. ? ?Recommend continuing the amlodpine 10mg  as scheduled, but given elevated blood pressures despite this will start atenolol 25mg  3 times weekly after dialysis. Recommend continued monitoring at dialysis.  ? ?Discussed importance of close primary care follow up and reasons to return to the ED in detail. Patient discharged in stable condition with understanding of reasons to return.   ? ? ? ? ? ? ? ?Final Clinical Impression(s) / ED Diagnoses ?Final diagnoses:  ?Primary hypertension  ? ? ?Rx / DC Orders ?ED Discharge Orders   ? ?      Ordered  ?  atenolol (TENORMIN) 25 MG tablet       ? 07/19/21 1620  ? ?  ?  ? ?  ? ? ?  ?Gareth Morgan, MD ?07/20/21 2123 ? ?

## 2021-07-19 NOTE — ED Triage Notes (Signed)
Pt presents POV with concerns of high BP since 07/02/2021 ?Pt reports he changed his diet and lost a few pounds. He was taking 30mg  Nifedipine ER and 0.2 mg Clonidine. Pt reports his BP started to drop really low, PCP switched him to 10mg  Amlodipine 1 tab daily. Since the switch he has had issues with high BP. ?Pt denies Chest pain, SOB, dizziness or headaches.  ?Did not take his BP meds last night. Pt is a dialysis pt   ?

## 2022-01-14 ENCOUNTER — Emergency Department (HOSPITAL_COMMUNITY): Payer: Medicare Other

## 2022-01-14 ENCOUNTER — Encounter (HOSPITAL_COMMUNITY): Payer: Self-pay

## 2022-01-14 ENCOUNTER — Other Ambulatory Visit: Payer: Self-pay | Admitting: *Deleted

## 2022-01-14 ENCOUNTER — Emergency Department (HOSPITAL_COMMUNITY)
Admission: EM | Admit: 2022-01-14 | Discharge: 2022-01-15 | Disposition: A | Discharge status: Home / Self Care | Payer: Medicare Other | Attending: Emergency Medicine | Admitting: Emergency Medicine

## 2022-01-14 DIAGNOSIS — S4991XA Unspecified injury of right shoulder and upper arm, initial encounter: Secondary | ICD-10-CM | POA: Diagnosis present

## 2022-01-14 DIAGNOSIS — N186 End stage renal disease: Secondary | ICD-10-CM | POA: Insufficient documentation

## 2022-01-14 DIAGNOSIS — Z7982 Long term (current) use of aspirin: Secondary | ICD-10-CM | POA: Insufficient documentation

## 2022-01-14 DIAGNOSIS — Z992 Dependence on renal dialysis: Secondary | ICD-10-CM | POA: Diagnosis not present

## 2022-01-14 DIAGNOSIS — Y92410 Unspecified street and highway as the place of occurrence of the external cause: Secondary | ICD-10-CM | POA: Diagnosis not present

## 2022-01-14 DIAGNOSIS — S46911A Strain of unspecified muscle, fascia and tendon at shoulder and upper arm level, right arm, initial encounter: Secondary | ICD-10-CM | POA: Diagnosis not present

## 2022-01-14 DIAGNOSIS — M25521 Pain in right elbow: Secondary | ICD-10-CM | POA: Insufficient documentation
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
## 2022-01-15 MED ORDER — CYCLOBENZAPRINE HCL 10 MG PO TABS: 10.0000 mg | ORAL_TABLET | Freq: Two times a day (BID) | ORAL | 0 refills | Status: DC | PRN
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
## 2022-01-15 NOTE — Discharge Instructions (Signed)
You have been seen here for right shoulder pain. I recommend taking over-the-counter pain medications like ibuprofen and/or Tylenol every 6 as needed.  Please follow dosage and on the back of bottle.  I also recommend applying heat to the area and stretching out the muscles as this will help decrease stiffness and pain.  I have given you information on exercises please follow.  You may wear your sling as comfort but I would not wear it over 5 days time as I do not want you to lose range of motion in it.  I have given you a muscle relaxer can make you drowsy do not consume alcohol operate heavy machine while taking this medication  Follow-up with Ortho as needed  Come back to the emergency department if you develop chest pain, shortness of breath, severe abdominal pain, uncontrolled nausea, vomiting, diarrhea.

## 2022-01-23 ENCOUNTER — Encounter: Payer: Self-pay | Admitting: Vascular Surgery

## 2022-01-23 ENCOUNTER — Ambulatory Visit (HOSPITAL_COMMUNITY)
Admission: RE | Admit: 2022-01-23 | Discharge: 2022-01-23 | Disposition: A | Discharge status: Home / Self Care | Payer: Medicare Other | Source: Ambulatory Visit | Attending: Vascular Surgery | Admitting: Vascular Surgery

## 2022-01-23 ENCOUNTER — Ambulatory Visit (INDEPENDENT_AMBULATORY_CARE_PROVIDER_SITE_OTHER)
Admission: RE | Admit: 2022-01-23 | Discharge: 2022-01-23 | Disposition: A | Discharge status: Home / Self Care | Payer: Medicare Other | Source: Ambulatory Visit | Attending: Vascular Surgery | Admitting: Vascular Surgery

## 2022-01-23 ENCOUNTER — Ambulatory Visit (INDEPENDENT_AMBULATORY_CARE_PROVIDER_SITE_OTHER): Payer: Medicare Other | Admitting: Vascular Surgery

## 2022-01-23 VITALS — BP 140/78 | HR 75 | Temp 98.1°F | Resp 18 | Ht 70.0 in | Wt 187.0 lb

## 2022-01-23 DIAGNOSIS — Z992 Dependence on renal dialysis: Secondary | ICD-10-CM | POA: Insufficient documentation

## 2022-01-23 DIAGNOSIS — N186 End stage renal disease: Secondary | ICD-10-CM
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
## 2022-02-11 ENCOUNTER — Other Ambulatory Visit: Payer: Self-pay | Admitting: Orthopedic Surgery

## 2022-02-11 DIAGNOSIS — M25511 Pain in right shoulder: Secondary | ICD-10-CM

## 2022-03-07 ENCOUNTER — Ambulatory Visit
Admission: RE | Admit: 2022-03-07 | Discharge: 2022-03-07 | Disposition: A | Discharge status: Home / Self Care | Payer: Medicare Other | Source: Ambulatory Visit | Attending: Orthopedic Surgery | Admitting: Orthopedic Surgery

## 2022-03-07 DIAGNOSIS — M25511 Pain in right shoulder: Secondary | ICD-10-CM

## 2022-03-25 DIAGNOSIS — N2581 Secondary hyperparathyroidism of renal origin: Secondary | ICD-10-CM | POA: Diagnosis not present

## 2022-03-25 DIAGNOSIS — D631 Anemia in chronic kidney disease: Secondary | ICD-10-CM | POA: Diagnosis not present

## 2022-03-25 DIAGNOSIS — R197 Diarrhea, unspecified: Secondary | ICD-10-CM | POA: Diagnosis not present

## 2022-03-25 DIAGNOSIS — Z992 Dependence on renal dialysis: Secondary | ICD-10-CM | POA: Diagnosis not present

## 2022-03-25 DIAGNOSIS — N186 End stage renal disease: Secondary | ICD-10-CM | POA: Diagnosis not present

## 2022-03-25 DIAGNOSIS — Z23 Encounter for immunization: Secondary | ICD-10-CM | POA: Diagnosis not present

## 2022-03-25 DIAGNOSIS — E876 Hypokalemia: Secondary | ICD-10-CM | POA: Diagnosis not present

## 2022-03-25 DIAGNOSIS — D689 Coagulation defect, unspecified: Secondary | ICD-10-CM | POA: Diagnosis not present

## 2022-04-22 DIAGNOSIS — N185 Chronic kidney disease, stage 5: Secondary | ICD-10-CM | POA: Diagnosis not present

## 2022-04-22 DIAGNOSIS — R5383 Other fatigue: Secondary | ICD-10-CM | POA: Diagnosis not present

## 2022-04-22 DIAGNOSIS — Z136 Encounter for screening for cardiovascular disorders: Secondary | ICD-10-CM | POA: Diagnosis not present

## 2022-04-22 DIAGNOSIS — Z940 Kidney transplant status: Secondary | ICD-10-CM | POA: Diagnosis not present

## 2022-04-22 DIAGNOSIS — S46001A Unspecified injury of muscle(s) and tendon(s) of the rotator cuff of right shoulder, initial encounter: Secondary | ICD-10-CM | POA: Diagnosis not present

## 2022-04-22 DIAGNOSIS — T7849XA Other allergy, initial encounter: Secondary | ICD-10-CM | POA: Diagnosis not present

## 2022-04-22 DIAGNOSIS — G2581 Restless legs syndrome: Secondary | ICD-10-CM | POA: Diagnosis not present

## 2022-04-22 DIAGNOSIS — L309 Dermatitis, unspecified: Secondary | ICD-10-CM | POA: Diagnosis not present

## 2022-04-22 DIAGNOSIS — F32A Depression, unspecified: Secondary | ICD-10-CM | POA: Diagnosis not present

## 2022-04-22 DIAGNOSIS — M25512 Pain in left shoulder: Secondary | ICD-10-CM | POA: Diagnosis not present

## 2022-04-22 DIAGNOSIS — I10 Essential (primary) hypertension: Secondary | ICD-10-CM | POA: Diagnosis not present

## 2022-04-22 DIAGNOSIS — Z1211 Encounter for screening for malignant neoplasm of colon: Secondary | ICD-10-CM | POA: Diagnosis not present

## 2022-04-23 DIAGNOSIS — Z992 Dependence on renal dialysis: Secondary | ICD-10-CM | POA: Diagnosis not present

## 2022-04-23 DIAGNOSIS — T8612 Kidney transplant failure: Secondary | ICD-10-CM | POA: Diagnosis not present

## 2022-04-23 DIAGNOSIS — H2510 Age-related nuclear cataract, unspecified eye: Secondary | ICD-10-CM | POA: Diagnosis not present

## 2022-04-23 DIAGNOSIS — H5203 Hypermetropia, bilateral: Secondary | ICD-10-CM | POA: Diagnosis not present

## 2022-04-23 DIAGNOSIS — H524 Presbyopia: Secondary | ICD-10-CM | POA: Diagnosis not present

## 2022-04-23 DIAGNOSIS — N186 End stage renal disease: Secondary | ICD-10-CM | POA: Diagnosis not present

## 2022-04-23 DIAGNOSIS — H52209 Unspecified astigmatism, unspecified eye: Secondary | ICD-10-CM | POA: Diagnosis not present

## 2022-04-24 DIAGNOSIS — N2581 Secondary hyperparathyroidism of renal origin: Secondary | ICD-10-CM | POA: Diagnosis not present

## 2022-04-24 DIAGNOSIS — D631 Anemia in chronic kidney disease: Secondary | ICD-10-CM | POA: Diagnosis not present

## 2022-04-24 DIAGNOSIS — Z992 Dependence on renal dialysis: Secondary | ICD-10-CM | POA: Diagnosis not present

## 2022-04-24 DIAGNOSIS — D689 Coagulation defect, unspecified: Secondary | ICD-10-CM | POA: Diagnosis not present

## 2022-04-24 DIAGNOSIS — N186 End stage renal disease: Secondary | ICD-10-CM | POA: Diagnosis not present

## 2022-04-24 DIAGNOSIS — E876 Hypokalemia: Secondary | ICD-10-CM | POA: Diagnosis not present

## 2022-05-02 DIAGNOSIS — Z136 Encounter for screening for cardiovascular disorders: Secondary | ICD-10-CM | POA: Diagnosis not present

## 2022-05-02 DIAGNOSIS — I10 Essential (primary) hypertension: Secondary | ICD-10-CM | POA: Diagnosis not present

## 2022-05-02 DIAGNOSIS — Z1159 Encounter for screening for other viral diseases: Secondary | ICD-10-CM | POA: Diagnosis not present

## 2022-05-02 DIAGNOSIS — Z125 Encounter for screening for malignant neoplasm of prostate: Secondary | ICD-10-CM | POA: Diagnosis not present

## 2022-05-02 DIAGNOSIS — Z131 Encounter for screening for diabetes mellitus: Secondary | ICD-10-CM | POA: Diagnosis not present

## 2022-05-14 DIAGNOSIS — M12811 Other specific arthropathies, not elsewhere classified, right shoulder: Secondary | ICD-10-CM | POA: Diagnosis not present

## 2022-05-16 DIAGNOSIS — Z992 Dependence on renal dialysis: Secondary | ICD-10-CM | POA: Diagnosis not present

## 2022-05-16 DIAGNOSIS — E1122 Type 2 diabetes mellitus with diabetic chronic kidney disease: Secondary | ICD-10-CM | POA: Diagnosis not present

## 2022-05-16 DIAGNOSIS — Z7682 Awaiting organ transplant status: Secondary | ICD-10-CM | POA: Diagnosis not present

## 2022-05-16 DIAGNOSIS — I10 Essential (primary) hypertension: Secondary | ICD-10-CM | POA: Diagnosis not present

## 2022-05-16 DIAGNOSIS — Z8659 Personal history of other mental and behavioral disorders: Secondary | ICD-10-CM | POA: Diagnosis not present

## 2022-05-16 DIAGNOSIS — I120 Hypertensive chronic kidney disease with stage 5 chronic kidney disease or end stage renal disease: Secondary | ICD-10-CM | POA: Diagnosis not present

## 2022-05-16 DIAGNOSIS — Z79899 Other long term (current) drug therapy: Secondary | ICD-10-CM | POA: Diagnosis not present

## 2022-05-16 DIAGNOSIS — T8612 Kidney transplant failure: Secondary | ICD-10-CM | POA: Diagnosis not present

## 2022-05-16 DIAGNOSIS — T8611 Kidney transplant rejection: Secondary | ICD-10-CM | POA: Diagnosis not present

## 2022-05-16 DIAGNOSIS — E1121 Type 2 diabetes mellitus with diabetic nephropathy: Secondary | ICD-10-CM | POA: Diagnosis not present

## 2022-05-16 DIAGNOSIS — Z01818 Encounter for other preprocedural examination: Secondary | ICD-10-CM | POA: Diagnosis not present

## 2022-05-16 DIAGNOSIS — Z1159 Encounter for screening for other viral diseases: Secondary | ICD-10-CM | POA: Diagnosis not present

## 2022-05-16 DIAGNOSIS — R768 Other specified abnormal immunological findings in serum: Secondary | ICD-10-CM | POA: Diagnosis not present

## 2022-05-16 DIAGNOSIS — Z114 Encounter for screening for human immunodeficiency virus [HIV]: Secondary | ICD-10-CM | POA: Diagnosis not present

## 2022-05-16 DIAGNOSIS — Z125 Encounter for screening for malignant neoplasm of prostate: Secondary | ICD-10-CM | POA: Diagnosis not present

## 2022-05-16 DIAGNOSIS — N186 End stage renal disease: Secondary | ICD-10-CM | POA: Diagnosis not present

## 2022-05-16 DIAGNOSIS — Z5989 Other problems related to housing and economic circumstances: Secondary | ICD-10-CM | POA: Diagnosis not present

## 2022-05-16 DIAGNOSIS — Z01810 Encounter for preprocedural cardiovascular examination: Secondary | ICD-10-CM | POA: Diagnosis not present

## 2022-05-19 DIAGNOSIS — M25512 Pain in left shoulder: Secondary | ICD-10-CM | POA: Diagnosis not present

## 2022-05-19 DIAGNOSIS — M12811 Other specific arthropathies, not elsewhere classified, right shoulder: Secondary | ICD-10-CM | POA: Diagnosis not present

## 2022-05-22 DIAGNOSIS — N186 End stage renal disease: Secondary | ICD-10-CM | POA: Diagnosis not present

## 2022-05-22 DIAGNOSIS — Z992 Dependence on renal dialysis: Secondary | ICD-10-CM | POA: Diagnosis not present

## 2022-05-22 DIAGNOSIS — T8612 Kidney transplant failure: Secondary | ICD-10-CM | POA: Diagnosis not present

## 2022-05-24 DIAGNOSIS — Z992 Dependence on renal dialysis: Secondary | ICD-10-CM | POA: Diagnosis not present

## 2022-05-24 DIAGNOSIS — E876 Hypokalemia: Secondary | ICD-10-CM | POA: Diagnosis not present

## 2022-05-24 DIAGNOSIS — E875 Hyperkalemia: Secondary | ICD-10-CM | POA: Diagnosis not present

## 2022-05-24 DIAGNOSIS — N186 End stage renal disease: Secondary | ICD-10-CM | POA: Diagnosis not present

## 2022-05-24 DIAGNOSIS — D689 Coagulation defect, unspecified: Secondary | ICD-10-CM | POA: Diagnosis not present

## 2022-05-24 DIAGNOSIS — D631 Anemia in chronic kidney disease: Secondary | ICD-10-CM | POA: Diagnosis not present

## 2022-05-24 DIAGNOSIS — N2581 Secondary hyperparathyroidism of renal origin: Secondary | ICD-10-CM | POA: Diagnosis not present

## 2022-06-22 DIAGNOSIS — N186 End stage renal disease: Secondary | ICD-10-CM | POA: Diagnosis not present

## 2022-06-22 DIAGNOSIS — Z992 Dependence on renal dialysis: Secondary | ICD-10-CM | POA: Diagnosis not present

## 2022-06-22 DIAGNOSIS — T8612 Kidney transplant failure: Secondary | ICD-10-CM | POA: Diagnosis not present

## 2022-06-24 DIAGNOSIS — D631 Anemia in chronic kidney disease: Secondary | ICD-10-CM | POA: Diagnosis not present

## 2022-06-24 DIAGNOSIS — E876 Hypokalemia: Secondary | ICD-10-CM | POA: Diagnosis not present

## 2022-06-24 DIAGNOSIS — Z992 Dependence on renal dialysis: Secondary | ICD-10-CM | POA: Diagnosis not present

## 2022-06-24 DIAGNOSIS — D689 Coagulation defect, unspecified: Secondary | ICD-10-CM | POA: Diagnosis not present

## 2022-06-24 DIAGNOSIS — N186 End stage renal disease: Secondary | ICD-10-CM | POA: Diagnosis not present

## 2022-06-24 DIAGNOSIS — N2581 Secondary hyperparathyroidism of renal origin: Secondary | ICD-10-CM | POA: Diagnosis not present

## 2022-06-24 DIAGNOSIS — R52 Pain, unspecified: Secondary | ICD-10-CM | POA: Diagnosis not present

## 2022-07-16 DIAGNOSIS — F418 Other specified anxiety disorders: Secondary | ICD-10-CM | POA: Diagnosis not present

## 2022-07-16 DIAGNOSIS — G2581 Restless legs syndrome: Secondary | ICD-10-CM | POA: Diagnosis not present

## 2022-07-16 DIAGNOSIS — S46001A Unspecified injury of muscle(s) and tendon(s) of the rotator cuff of right shoulder, initial encounter: Secondary | ICD-10-CM | POA: Diagnosis not present

## 2022-07-16 DIAGNOSIS — Z992 Dependence on renal dialysis: Secondary | ICD-10-CM | POA: Diagnosis not present

## 2022-07-16 DIAGNOSIS — N186 End stage renal disease: Secondary | ICD-10-CM | POA: Diagnosis not present

## 2022-07-16 DIAGNOSIS — D638 Anemia in other chronic diseases classified elsewhere: Secondary | ICD-10-CM | POA: Diagnosis not present

## 2022-07-16 DIAGNOSIS — T8611 Kidney transplant rejection: Secondary | ICD-10-CM | POA: Diagnosis not present

## 2022-07-16 DIAGNOSIS — M25512 Pain in left shoulder: Secondary | ICD-10-CM | POA: Diagnosis not present

## 2022-07-16 DIAGNOSIS — Z1211 Encounter for screening for malignant neoplasm of colon: Secondary | ICD-10-CM | POA: Diagnosis not present

## 2022-07-16 DIAGNOSIS — I10 Essential (primary) hypertension: Secondary | ICD-10-CM | POA: Diagnosis not present

## 2022-07-16 DIAGNOSIS — F32A Depression, unspecified: Secondary | ICD-10-CM | POA: Diagnosis not present

## 2022-07-16 DIAGNOSIS — R5383 Other fatigue: Secondary | ICD-10-CM | POA: Diagnosis not present

## 2022-07-22 ENCOUNTER — Other Ambulatory Visit (HOSPITAL_COMMUNITY): Payer: Self-pay | Admitting: Internal Medicine

## 2022-07-22 DIAGNOSIS — R011 Cardiac murmur, unspecified: Secondary | ICD-10-CM

## 2022-07-22 DIAGNOSIS — T8612 Kidney transplant failure: Secondary | ICD-10-CM | POA: Diagnosis not present

## 2022-07-22 DIAGNOSIS — Z992 Dependence on renal dialysis: Secondary | ICD-10-CM | POA: Diagnosis not present

## 2022-07-22 DIAGNOSIS — N186 End stage renal disease: Secondary | ICD-10-CM | POA: Diagnosis not present

## 2022-07-24 DIAGNOSIS — E875 Hyperkalemia: Secondary | ICD-10-CM | POA: Diagnosis not present

## 2022-07-24 DIAGNOSIS — Z992 Dependence on renal dialysis: Secondary | ICD-10-CM | POA: Diagnosis not present

## 2022-07-24 DIAGNOSIS — N2581 Secondary hyperparathyroidism of renal origin: Secondary | ICD-10-CM | POA: Diagnosis not present

## 2022-07-24 DIAGNOSIS — E876 Hypokalemia: Secondary | ICD-10-CM | POA: Diagnosis not present

## 2022-07-24 DIAGNOSIS — N186 End stage renal disease: Secondary | ICD-10-CM | POA: Diagnosis not present

## 2022-07-24 DIAGNOSIS — R52 Pain, unspecified: Secondary | ICD-10-CM | POA: Diagnosis not present

## 2022-07-24 DIAGNOSIS — D631 Anemia in chronic kidney disease: Secondary | ICD-10-CM | POA: Diagnosis not present

## 2022-07-24 DIAGNOSIS — D689 Coagulation defect, unspecified: Secondary | ICD-10-CM | POA: Diagnosis not present

## 2022-07-25 DIAGNOSIS — I352 Nonrheumatic aortic (valve) stenosis with insufficiency: Secondary | ICD-10-CM | POA: Diagnosis not present

## 2022-07-25 DIAGNOSIS — Z7682 Awaiting organ transplant status: Secondary | ICD-10-CM | POA: Diagnosis not present

## 2022-07-25 DIAGNOSIS — I517 Cardiomegaly: Secondary | ICD-10-CM | POA: Diagnosis not present

## 2022-07-25 DIAGNOSIS — Z01810 Encounter for preprocedural cardiovascular examination: Secondary | ICD-10-CM | POA: Diagnosis not present

## 2022-08-05 ENCOUNTER — Other Ambulatory Visit (HOSPITAL_BASED_OUTPATIENT_CLINIC_OR_DEPARTMENT_OTHER): Payer: Self-pay

## 2022-08-05 DIAGNOSIS — G4733 Obstructive sleep apnea (adult) (pediatric): Secondary | ICD-10-CM

## 2022-08-06 ENCOUNTER — Ambulatory Visit (HOSPITAL_COMMUNITY)
Admission: RE | Admit: 2022-08-06 | Discharge: 2022-08-06 | Disposition: A | Discharge status: Home / Self Care | Payer: No Typology Code available for payment source | Source: Ambulatory Visit | Attending: Internal Medicine | Admitting: Internal Medicine

## 2022-08-06 DIAGNOSIS — R011 Cardiac murmur, unspecified: Secondary | ICD-10-CM | POA: Diagnosis not present

## 2022-08-06 DIAGNOSIS — I10 Essential (primary) hypertension: Secondary | ICD-10-CM | POA: Insufficient documentation

## 2022-08-06 DIAGNOSIS — E785 Hyperlipidemia, unspecified: Secondary | ICD-10-CM | POA: Diagnosis not present

## 2022-08-06 LAB — ECHOCARDIOGRAM COMPLETE
AR max vel: 1.57 cm2
AV Area VTI: 1.48 cm2
AV Area mean vel: 1.42 cm2
AV Mean grad: 11 mmHg
AV Peak grad: 17.4 mmHg
Ao pk vel: 2.09 m/s
Area-P 1/2: 3.85 cm2
Calc EF: 55.9 %
P 1/2 time: 383 msec
S' Lateral: 3.6 cm
Single Plane A2C EF: 54.3 %
Single Plane A4C EF: 55.2 %

## 2022-08-22 DIAGNOSIS — Z992 Dependence on renal dialysis: Secondary | ICD-10-CM | POA: Diagnosis not present

## 2022-08-22 DIAGNOSIS — T8612 Kidney transplant failure: Secondary | ICD-10-CM | POA: Diagnosis not present

## 2022-08-22 DIAGNOSIS — N186 End stage renal disease: Secondary | ICD-10-CM | POA: Diagnosis not present

## 2022-08-23 DIAGNOSIS — N2581 Secondary hyperparathyroidism of renal origin: Secondary | ICD-10-CM | POA: Diagnosis not present

## 2022-08-23 DIAGNOSIS — D689 Coagulation defect, unspecified: Secondary | ICD-10-CM | POA: Diagnosis not present

## 2022-08-23 DIAGNOSIS — E876 Hypokalemia: Secondary | ICD-10-CM | POA: Diagnosis not present

## 2022-08-23 DIAGNOSIS — N186 End stage renal disease: Secondary | ICD-10-CM | POA: Diagnosis not present

## 2022-08-23 DIAGNOSIS — D631 Anemia in chronic kidney disease: Secondary | ICD-10-CM | POA: Diagnosis not present

## 2022-08-23 DIAGNOSIS — Z992 Dependence on renal dialysis: Secondary | ICD-10-CM | POA: Diagnosis not present

## 2022-09-07 ENCOUNTER — Inpatient Hospital Stay (HOSPITAL_COMMUNITY): Payer: No Typology Code available for payment source

## 2022-09-07 ENCOUNTER — Emergency Department (HOSPITAL_COMMUNITY): Payer: No Typology Code available for payment source

## 2022-09-07 ENCOUNTER — Inpatient Hospital Stay (HOSPITAL_COMMUNITY)
Admission: EM | Admit: 2022-09-07 | Discharge: 2022-09-25 | DRG: 314 | Disposition: A | Discharge status: Rehabilitation Facility | Payer: No Typology Code available for payment source | Attending: Internal Medicine | Admitting: Internal Medicine

## 2022-09-07 ENCOUNTER — Encounter (HOSPITAL_COMMUNITY): Payer: Self-pay | Admitting: Family Medicine

## 2022-09-07 DIAGNOSIS — R6510 Systemic inflammatory response syndrome (SIRS) of non-infectious origin without acute organ dysfunction: Secondary | ICD-10-CM | POA: Diagnosis not present

## 2022-09-07 DIAGNOSIS — I120 Hypertensive chronic kidney disease with stage 5 chronic kidney disease or end stage renal disease: Secondary | ICD-10-CM | POA: Diagnosis present

## 2022-09-07 DIAGNOSIS — I330 Acute and subacute infective endocarditis: Secondary | ICD-10-CM | POA: Diagnosis not present

## 2022-09-07 DIAGNOSIS — J189 Pneumonia, unspecified organism: Secondary | ICD-10-CM | POA: Diagnosis not present

## 2022-09-07 DIAGNOSIS — K5730 Diverticulosis of large intestine without perforation or abscess without bleeding: Secondary | ICD-10-CM | POA: Diagnosis not present

## 2022-09-07 DIAGNOSIS — E43 Unspecified severe protein-calorie malnutrition: Secondary | ICD-10-CM | POA: Diagnosis not present

## 2022-09-07 DIAGNOSIS — D72829 Elevated white blood cell count, unspecified: Secondary | ICD-10-CM | POA: Diagnosis not present

## 2022-09-07 DIAGNOSIS — R569 Unspecified convulsions: Secondary | ICD-10-CM | POA: Diagnosis not present

## 2022-09-07 DIAGNOSIS — M62830 Muscle spasm of back: Secondary | ICD-10-CM | POA: Diagnosis present

## 2022-09-07 DIAGNOSIS — M4647 Discitis, unspecified, lumbosacral region: Secondary | ICD-10-CM | POA: Diagnosis present

## 2022-09-07 DIAGNOSIS — R933 Abnormal findings on diagnostic imaging of other parts of digestive tract: Secondary | ICD-10-CM | POA: Diagnosis not present

## 2022-09-07 DIAGNOSIS — B9561 Methicillin susceptible Staphylococcus aureus infection as the cause of diseases classified elsewhere: Secondary | ICD-10-CM | POA: Diagnosis not present

## 2022-09-07 DIAGNOSIS — E1169 Type 2 diabetes mellitus with other specified complication: Secondary | ICD-10-CM | POA: Diagnosis not present

## 2022-09-07 DIAGNOSIS — Z820 Family history of epilepsy and other diseases of the nervous system: Secondary | ICD-10-CM

## 2022-09-07 DIAGNOSIS — D631 Anemia in chronic kidney disease: Secondary | ICD-10-CM | POA: Diagnosis present

## 2022-09-07 DIAGNOSIS — T82598A Other mechanical complication of other cardiac and vascular devices and implants, initial encounter: Secondary | ICD-10-CM | POA: Diagnosis not present

## 2022-09-07 DIAGNOSIS — Z1152 Encounter for screening for COVID-19: Secondary | ICD-10-CM | POA: Diagnosis not present

## 2022-09-07 DIAGNOSIS — M4626 Osteomyelitis of vertebra, lumbar region: Secondary | ICD-10-CM | POA: Diagnosis not present

## 2022-09-07 DIAGNOSIS — R509 Fever, unspecified: Secondary | ICD-10-CM | POA: Diagnosis not present

## 2022-09-07 DIAGNOSIS — R4182 Altered mental status, unspecified: Secondary | ICD-10-CM | POA: Diagnosis not present

## 2022-09-07 DIAGNOSIS — J984 Other disorders of lung: Secondary | ICD-10-CM | POA: Diagnosis not present

## 2022-09-07 DIAGNOSIS — T8612 Kidney transplant failure: Secondary | ICD-10-CM | POA: Diagnosis not present

## 2022-09-07 DIAGNOSIS — M4627 Osteomyelitis of vertebra, lumbosacral region: Secondary | ICD-10-CM | POA: Diagnosis present

## 2022-09-07 DIAGNOSIS — I10 Essential (primary) hypertension: Secondary | ICD-10-CM | POA: Diagnosis not present

## 2022-09-07 DIAGNOSIS — R4781 Slurred speech: Secondary | ICD-10-CM | POA: Diagnosis not present

## 2022-09-07 DIAGNOSIS — J9601 Acute respiratory failure with hypoxia: Secondary | ICD-10-CM | POA: Diagnosis not present

## 2022-09-07 DIAGNOSIS — R7989 Other specified abnormal findings of blood chemistry: Secondary | ICD-10-CM | POA: Diagnosis present

## 2022-09-07 DIAGNOSIS — I69391 Dysphagia following cerebral infarction: Secondary | ICD-10-CM | POA: Diagnosis not present

## 2022-09-07 DIAGNOSIS — N250 Renal osteodystrophy: Secondary | ICD-10-CM | POA: Diagnosis not present

## 2022-09-07 DIAGNOSIS — I959 Hypotension, unspecified: Secondary | ICD-10-CM | POA: Diagnosis not present

## 2022-09-07 DIAGNOSIS — Z992 Dependence on renal dialysis: Secondary | ICD-10-CM

## 2022-09-07 DIAGNOSIS — I5032 Chronic diastolic (congestive) heart failure: Secondary | ICD-10-CM | POA: Diagnosis not present

## 2022-09-07 DIAGNOSIS — M5459 Other low back pain: Secondary | ICD-10-CM | POA: Diagnosis not present

## 2022-09-07 DIAGNOSIS — I4891 Unspecified atrial fibrillation: Secondary | ICD-10-CM | POA: Diagnosis not present

## 2022-09-07 DIAGNOSIS — E871 Hypo-osmolality and hyponatremia: Secondary | ICD-10-CM | POA: Diagnosis not present

## 2022-09-07 DIAGNOSIS — E785 Hyperlipidemia, unspecified: Secondary | ICD-10-CM | POA: Diagnosis not present

## 2022-09-07 DIAGNOSIS — M898X9 Other specified disorders of bone, unspecified site: Secondary | ICD-10-CM | POA: Diagnosis present

## 2022-09-07 DIAGNOSIS — I4719 Other supraventricular tachycardia: Secondary | ICD-10-CM | POA: Diagnosis not present

## 2022-09-07 DIAGNOSIS — Y738 Miscellaneous gastroenterology and urology devices associated with adverse incidents, not elsewhere classified: Secondary | ICD-10-CM | POA: Diagnosis present

## 2022-09-07 DIAGNOSIS — J811 Chronic pulmonary edema: Secondary | ICD-10-CM | POA: Diagnosis present

## 2022-09-07 DIAGNOSIS — J690 Pneumonitis due to inhalation of food and vomit: Secondary | ICD-10-CM | POA: Diagnosis not present

## 2022-09-07 DIAGNOSIS — R059 Cough, unspecified: Secondary | ICD-10-CM | POA: Diagnosis not present

## 2022-09-07 DIAGNOSIS — Z888 Allergy status to other drugs, medicaments and biological substances status: Secondary | ICD-10-CM

## 2022-09-07 DIAGNOSIS — K56609 Unspecified intestinal obstruction, unspecified as to partial versus complete obstruction: Secondary | ICD-10-CM | POA: Diagnosis not present

## 2022-09-07 DIAGNOSIS — K56600 Partial intestinal obstruction, unspecified as to cause: Secondary | ICD-10-CM | POA: Diagnosis not present

## 2022-09-07 DIAGNOSIS — K6389 Other specified diseases of intestine: Secondary | ICD-10-CM | POA: Diagnosis not present

## 2022-09-07 DIAGNOSIS — K5901 Slow transit constipation: Secondary | ICD-10-CM | POA: Diagnosis not present

## 2022-09-07 DIAGNOSIS — T80211A Bloodstream infection due to central venous catheter, initial encounter: Secondary | ICD-10-CM | POA: Diagnosis not present

## 2022-09-07 DIAGNOSIS — N289 Disorder of kidney and ureter, unspecified: Secondary | ICD-10-CM | POA: Diagnosis not present

## 2022-09-07 DIAGNOSIS — J188 Other pneumonia, unspecified organism: Secondary | ICD-10-CM | POA: Diagnosis present

## 2022-09-07 DIAGNOSIS — R7881 Bacteremia: Secondary | ICD-10-CM | POA: Diagnosis present

## 2022-09-07 DIAGNOSIS — I2489 Other forms of acute ischemic heart disease: Secondary | ICD-10-CM | POA: Diagnosis not present

## 2022-09-07 DIAGNOSIS — N186 End stage renal disease: Secondary | ICD-10-CM | POA: Diagnosis not present

## 2022-09-07 DIAGNOSIS — N2581 Secondary hyperparathyroidism of renal origin: Secondary | ICD-10-CM | POA: Diagnosis present

## 2022-09-07 DIAGNOSIS — E875 Hyperkalemia: Secondary | ICD-10-CM | POA: Diagnosis not present

## 2022-09-07 DIAGNOSIS — Z7982 Long term (current) use of aspirin: Secondary | ICD-10-CM

## 2022-09-07 DIAGNOSIS — D649 Anemia, unspecified: Secondary | ICD-10-CM | POA: Diagnosis not present

## 2022-09-07 DIAGNOSIS — I639 Cerebral infarction, unspecified: Secondary | ICD-10-CM | POA: Diagnosis not present

## 2022-09-07 DIAGNOSIS — M869 Osteomyelitis, unspecified: Secondary | ICD-10-CM | POA: Diagnosis not present

## 2022-09-07 DIAGNOSIS — M4640 Discitis, unspecified, site unspecified: Secondary | ICD-10-CM | POA: Diagnosis not present

## 2022-09-07 DIAGNOSIS — I2690 Septic pulmonary embolism without acute cor pulmonale: Secondary | ICD-10-CM | POA: Diagnosis not present

## 2022-09-07 DIAGNOSIS — Z6826 Body mass index (BMI) 26.0-26.9, adult: Secondary | ICD-10-CM

## 2022-09-07 DIAGNOSIS — Z792 Long term (current) use of antibiotics: Secondary | ICD-10-CM

## 2022-09-07 DIAGNOSIS — E1122 Type 2 diabetes mellitus with diabetic chronic kidney disease: Secondary | ICD-10-CM | POA: Diagnosis not present

## 2022-09-07 DIAGNOSIS — I76 Septic arterial embolism: Secondary | ICD-10-CM | POA: Diagnosis not present

## 2022-09-07 DIAGNOSIS — E8770 Fluid overload, unspecified: Secondary | ICD-10-CM | POA: Diagnosis not present

## 2022-09-07 DIAGNOSIS — M545 Low back pain, unspecified: Secondary | ICD-10-CM | POA: Diagnosis not present

## 2022-09-07 DIAGNOSIS — R918 Other nonspecific abnormal finding of lung field: Secondary | ICD-10-CM | POA: Diagnosis present

## 2022-09-07 DIAGNOSIS — J3489 Other specified disorders of nose and nasal sinuses: Secondary | ICD-10-CM | POA: Diagnosis not present

## 2022-09-07 DIAGNOSIS — R197 Diarrhea, unspecified: Secondary | ICD-10-CM | POA: Diagnosis not present

## 2022-09-07 DIAGNOSIS — E440 Moderate protein-calorie malnutrition: Secondary | ICD-10-CM | POA: Diagnosis not present

## 2022-09-07 DIAGNOSIS — R1319 Other dysphagia: Secondary | ICD-10-CM | POA: Diagnosis not present

## 2022-09-07 DIAGNOSIS — R000 Tachycardia, unspecified: Secondary | ICD-10-CM | POA: Diagnosis not present

## 2022-09-07 DIAGNOSIS — Z4901 Encounter for fitting and adjustment of extracorporeal dialysis catheter: Secondary | ICD-10-CM | POA: Diagnosis not present

## 2022-09-07 DIAGNOSIS — I339 Acute and subacute endocarditis, unspecified: Secondary | ICD-10-CM | POA: Diagnosis not present

## 2022-09-07 DIAGNOSIS — T80211D Bloodstream infection due to central venous catheter, subsequent encounter: Secondary | ICD-10-CM | POA: Diagnosis not present

## 2022-09-07 DIAGNOSIS — R34 Anuria and oliguria: Secondary | ICD-10-CM | POA: Diagnosis not present

## 2022-09-07 DIAGNOSIS — I350 Nonrheumatic aortic (valve) stenosis: Secondary | ICD-10-CM | POA: Diagnosis not present

## 2022-09-07 DIAGNOSIS — I39 Endocarditis and heart valve disorders in diseases classified elsewhere: Secondary | ICD-10-CM | POA: Diagnosis not present

## 2022-09-07 DIAGNOSIS — I132 Hypertensive heart and chronic kidney disease with heart failure and with stage 5 chronic kidney disease, or end stage renal disease: Secondary | ICD-10-CM | POA: Diagnosis not present

## 2022-09-07 DIAGNOSIS — R29818 Other symptoms and signs involving the nervous system: Secondary | ICD-10-CM | POA: Diagnosis not present

## 2022-09-07 DIAGNOSIS — M549 Dorsalgia, unspecified: Secondary | ICD-10-CM | POA: Diagnosis not present

## 2022-09-07 DIAGNOSIS — R140 Abdominal distension (gaseous): Secondary | ICD-10-CM | POA: Diagnosis not present

## 2022-09-07 DIAGNOSIS — Z823 Family history of stroke: Secondary | ICD-10-CM

## 2022-09-07 DIAGNOSIS — K5669 Other partial intestinal obstruction: Secondary | ICD-10-CM | POA: Diagnosis not present

## 2022-09-07 DIAGNOSIS — J9811 Atelectasis: Secondary | ICD-10-CM | POA: Diagnosis not present

## 2022-09-07 DIAGNOSIS — D62 Acute posthemorrhagic anemia: Secondary | ICD-10-CM | POA: Diagnosis not present

## 2022-09-07 DIAGNOSIS — R195 Other fecal abnormalities: Secondary | ICD-10-CM | POA: Diagnosis not present

## 2022-09-07 DIAGNOSIS — Z91041 Radiographic dye allergy status: Secondary | ICD-10-CM

## 2022-09-07 DIAGNOSIS — J9690 Respiratory failure, unspecified, unspecified whether with hypoxia or hypercapnia: Secondary | ICD-10-CM | POA: Diagnosis not present

## 2022-09-07 DIAGNOSIS — D638 Anemia in other chronic diseases classified elsewhere: Secondary | ICD-10-CM | POA: Diagnosis not present

## 2022-09-07 DIAGNOSIS — I059 Rheumatic mitral valve disease, unspecified: Secondary | ICD-10-CM | POA: Diagnosis not present

## 2022-09-07 DIAGNOSIS — I5031 Acute diastolic (congestive) heart failure: Secondary | ICD-10-CM | POA: Diagnosis not present

## 2022-09-07 DIAGNOSIS — N281 Cyst of kidney, acquired: Secondary | ICD-10-CM | POA: Diagnosis not present

## 2022-09-07 DIAGNOSIS — Z79899 Other long term (current) drug therapy: Secondary | ICD-10-CM

## 2022-09-07 DIAGNOSIS — R0989 Other specified symptoms and signs involving the circulatory and respiratory systems: Secondary | ICD-10-CM | POA: Diagnosis not present

## 2022-09-07 DIAGNOSIS — Z8249 Family history of ischemic heart disease and other diseases of the circulatory system: Secondary | ICD-10-CM

## 2022-09-07 LAB — TROPONIN I (HIGH SENSITIVITY)
Troponin I (High Sensitivity): 92 ng/L — ABNORMAL HIGH (ref <18)
Troponin I (High Sensitivity): 111 ng/L (ref <18)

## 2022-09-07 LAB — BLOOD CULTURE ID PANEL (REFLEXED) - BCID2

## 2022-09-07 LAB — RESPIRATORY PANEL BY PCR

## 2022-09-07 LAB — CBC WITH DIFFERENTIAL/PLATELET
Abs Immature Granulocytes: 0.29 10*3/uL — ABNORMAL HIGH (ref 0.00–0.07)
Basophils Absolute: 0.1 10*3/uL (ref 0.0–0.1)
Basophils Relative: 0 %
Eosinophils Absolute: 0.1 10*3/uL (ref 0.0–0.5)
Eosinophils Relative: 0 %
HCT: 34.7 % — ABNORMAL LOW (ref 39.0–52.0)
Hemoglobin: 11.1 g/dL — ABNORMAL LOW (ref 13.0–17.0)
Immature Granulocytes: 1 %
Lymphocytes Relative: 2 %
Lymphs Abs: 0.4 10*3/uL — ABNORMAL LOW (ref 0.7–4.0)
MCH: 28.1 pg (ref 26.0–34.0)
MCHC: 32 g/dL (ref 30.0–36.0)
MCV: 87.8 fL (ref 80.0–100.0)
Monocytes Absolute: 2.1 10*3/uL — ABNORMAL HIGH (ref 0.1–1.0)
Monocytes Relative: 9 %
Neutro Abs: 21 10*3/uL — ABNORMAL HIGH (ref 1.7–7.7)
Neutrophils Relative %: 88 %
Platelets: 154 10*3/uL (ref 150–400)
RBC: 3.95 MIL/uL — ABNORMAL LOW (ref 4.22–5.81)
RDW: 14.6 % (ref 11.5–15.5)
WBC: 23.9 10*3/uL — ABNORMAL HIGH (ref 4.0–10.5)
nRBC: 0 % (ref 0.0–0.2)

## 2022-09-07 LAB — CULTURE, BLOOD (ROUTINE X 2)

## 2022-09-07 LAB — COMPREHENSIVE METABOLIC PANEL
ALT: 34 U/L (ref 0–44)
AST: 53 U/L — ABNORMAL HIGH (ref 15–41)
Albumin: 3.2 g/dL — ABNORMAL LOW (ref 3.5–5.0)
Alkaline Phosphatase: 86 U/L (ref 38–126)
Anion gap: 16 — ABNORMAL HIGH (ref 5–15)
BUN: 43 mg/dL — ABNORMAL HIGH (ref 8–23)
CO2: 28 mmol/L (ref 22–32)
Calcium: 9 mg/dL (ref 8.9–10.3)
Chloride: 91 mmol/L — ABNORMAL LOW (ref 98–111)
Creatinine, Ser: 8.42 mg/dL — ABNORMAL HIGH (ref 0.61–1.24)
GFR, Estimated: 7 mL/min — ABNORMAL LOW (ref >60)
Glucose, Bld: 142 mg/dL — ABNORMAL HIGH (ref 70–99)
Potassium: 4.1 mmol/L (ref 3.5–5.1)
Sodium: 135 mmol/L (ref 135–145)
Total Bilirubin: 0.6 mg/dL (ref 0.3–1.2)
Total Protein: 8.3 g/dL — ABNORMAL HIGH (ref 6.5–8.1)

## 2022-09-07 LAB — BRAIN NATRIURETIC PEPTIDE: B Natriuretic Peptide: 433.4 pg/mL — ABNORMAL HIGH (ref 0.0–100.0)

## 2022-09-07 LAB — SARS CORONAVIRUS 2 BY RT PCR: SARS Coronavirus 2 by RT PCR: NEGATIVE

## 2022-09-07 LAB — LACTIC ACID, PLASMA: Lactic Acid, Venous: 1.3 mmol/L (ref 0.5–1.9)

## 2022-09-07 MED ORDER — METHOCARBAMOL 500 MG PO TABS
1000.0000 mg | ORAL_TABLET | Freq: Once | ORAL | Status: AC
  Administered 2022-09-07: 1000 mg via ORAL
  Filled 2022-09-07: qty 2

## 2022-09-07 MED ORDER — HYDROMORPHONE HCL 1 MG/ML IJ SOLN
0.5000 mg | INTRAMUSCULAR | Status: DC | PRN
  Administered 2022-09-07 – 2022-09-14 (×15): 1 mg via INTRAVENOUS
  Filled 2022-09-07 (×15): qty 1

## 2022-09-07 MED ORDER — FENTANYL CITRATE PF 50 MCG/ML IJ SOSY
50.0000 ug | PREFILLED_SYRINGE | Freq: Once | INTRAMUSCULAR | Status: AC
  Administered 2022-09-07: 50 ug via INTRAVENOUS
  Filled 2022-09-07: qty 1

## 2022-09-07 MED ORDER — CEFAZOLIN SODIUM-DEXTROSE 2-4 GM/100ML-% IV SOLN
2.0000 g | Freq: Once | INTRAVENOUS | Status: AC
  Administered 2022-09-07: 2 g via INTRAVENOUS
  Filled 2022-09-07: qty 100

## 2022-09-07 MED ORDER — LIP MEDEX EX OINT: TOPICAL_OINTMENT | CUTANEOUS | Status: DC | PRN

## 2022-09-07 MED ORDER — VANCOMYCIN HCL 750 MG/150ML IV SOLN
750.0000 mg | Freq: Once | INTRAVENOUS | Status: AC
  Administered 2022-09-07: 750 mg via INTRAVENOUS
  Filled 2022-09-07: qty 150

## 2022-09-07 MED ORDER — METRONIDAZOLE 500 MG/100ML IV SOLN
500.0000 mg | Freq: Two times a day (BID) | INTRAVENOUS | Status: DC
  Administered 2022-09-07: 500 mg via INTRAVENOUS
  Filled 2022-09-07: qty 100

## 2022-09-07 MED ORDER — VANCOMYCIN HCL IN DEXTROSE 1-5 GM/200ML-% IV SOLN
1000.0000 mg | Freq: Once | INTRAVENOUS | Status: AC
  Administered 2022-09-07: 1000 mg via INTRAVENOUS
  Filled 2022-09-07: qty 200

## 2022-09-07 MED ORDER — ACETAMINOPHEN 650 MG RE SUPP: 650.0000 mg | Freq: Four times a day (QID) | RECTAL | Status: DC | PRN

## 2022-09-07 MED ORDER — SODIUM CHLORIDE 0.9 % IV SOLN: 1.0000 g | INTRAVENOUS | Status: DC

## 2022-09-07 MED ORDER — VANCOMYCIN VARIABLE DOSE PER UNSTABLE RENAL FUNCTION (PHARMACIST DOSING): Status: DC

## 2022-09-07 MED ORDER — CHLORHEXIDINE GLUCONATE CLOTH 2 % EX PADS
6.0000 | MEDICATED_PAD | Freq: Every day | CUTANEOUS | Status: DC
  Administered 2022-09-07 – 2022-09-19 (×13): 6 via TOPICAL

## 2022-09-07 MED ORDER — SENNA 8.6 MG PO TABS
1.0000 | ORAL_TABLET | Freq: Every day | ORAL | Status: DC | PRN
  Administered 2022-09-20: 8.6 mg via ORAL
  Filled 2022-09-07: qty 1

## 2022-09-07 MED ORDER — ONDANSETRON HCL 4 MG PO TABS
4.0000 mg | ORAL_TABLET | Freq: Four times a day (QID) | ORAL | Status: DC | PRN
  Administered 2022-09-10 – 2022-09-15 (×3): 4 mg via ORAL
  Filled 2022-09-07 (×3): qty 1

## 2022-09-07 MED ORDER — CEFAZOLIN SODIUM-DEXTROSE 2-4 GM/100ML-% IV SOLN
2.0000 g | INTRAVENOUS | Status: DC
  Administered 2022-09-09: 2 g via INTRAVENOUS
  Filled 2022-09-07 (×2): qty 100

## 2022-09-07 MED ORDER — KETAMINE HCL 50 MG/5ML IJ SOSY
0.3000 mg/kg | PREFILLED_SYRINGE | Freq: Once | INTRAMUSCULAR | Status: AC
  Administered 2022-09-07: 25 mg via INTRAVENOUS
  Filled 2022-09-07: qty 5

## 2022-09-07 MED ORDER — ACETAMINOPHEN 325 MG PO TABS
650.0000 mg | ORAL_TABLET | Freq: Four times a day (QID) | ORAL | Status: DC | PRN
  Administered 2022-09-13 – 2022-09-24 (×8): 650 mg via ORAL
  Filled 2022-09-07 (×8): qty 2

## 2022-09-07 MED ORDER — SODIUM CHLORIDE 0.9 % IV SOLN
2.0000 g | Freq: Once | INTRAVENOUS | Status: AC
  Administered 2022-09-07: 2 g via INTRAVENOUS
  Filled 2022-09-07: qty 12.5

## 2022-09-07 MED ORDER — SODIUM CHLORIDE 0.9% FLUSH
3.0000 mL | Freq: Two times a day (BID) | INTRAVENOUS | Status: DC
  Administered 2022-09-07 – 2022-09-24 (×25): 3 mL via INTRAVENOUS

## 2022-09-07 MED ORDER — OXYCODONE HCL 5 MG PO TABS
5.0000 mg | ORAL_TABLET | ORAL | Status: DC | PRN
  Administered 2022-09-08 – 2022-09-14 (×9): 5 mg via ORAL
  Filled 2022-09-07 (×9): qty 1

## 2022-09-07 MED ORDER — ONDANSETRON HCL 4 MG/2ML IJ SOLN: 4.0000 mg | Freq: Four times a day (QID) | INTRAMUSCULAR | Status: DC | PRN

## 2022-09-07 MED ORDER — MORPHINE SULFATE (PF) 4 MG/ML IV SOLN
4.0000 mg | Freq: Once | INTRAVENOUS | Status: AC
  Administered 2022-09-07: 4 mg via INTRAVENOUS
  Filled 2022-09-07: qty 1

## 2022-09-07 NOTE — Progress Notes (Signed)
Pharmacy Antibiotic Note  Ryan [name] is a 62 y.o. male admitted on 09/07/2022 with Low back pain with fever and bowel incontinence. Concern ofr possible spinal infection Pharmacy has been consulted for cefepime and vancomycin dosing.  1st doses given in the ED  Plan: Vanc dosing based on dialysis schedule Cefepime 1gm IV q24h Follow renal function, cultures and clinical course  Height: 5\' 9"  (175.3 cm) Weight: 83.9 kg (185 lb) IBW/kg (Calculated) : 70.7  Temp (24hrs), Avg:100.8 F (38.2 C), Min:99.1 F (37.3 C), Max:102.5 F (39.2 C)  Recent Labs  Lab 09/07/22 0215  WBC 23.9*  CREATININE 8.42*  LATICACIDVEN 1.3    Estimated Creatinine Clearance: 9.2 mL/min (A) (by C-G formula based on SCr of 8.42 mg/dL (H)).    Allergies  Allergen Reactions   Azithromycin Nausea And Vomiting and Other (See Comments)    Chest tightness   Iodinated Contrast Media Itching and Nausea Only    Probably needs prednisone prep prior to contrast    Antimicrobials this admission: 6/16 vanc >> 6/16 cefepime >>  Dose adjustments this admission:   Microbiology results: 6/16 BCx:  6/16 Resp:  Thank you for allowing pharmacy to be a part of this patient's care.  Arley Phenix RPh 09/07/2022, 5:37 AM

## 2022-09-07 NOTE — Progress Notes (Signed)
Pharmacy Antibiotic Note  Ryan [name] is a 62 y.o. male admitted on 09/07/2022 with Low back pain with fever and bowel incontinence. Concern ofr possible spinal infection Pharmacy has been consulted for cefepime and vancomycin dosing.   Vanc 1 gm given in ED @ 0420 am Cefepime 2 gm given in ED @ 0317 am Flagyl 500 mg IV given in ED @ 0535  Plan: Give additional vancomycin 750 mg IV now to make total loading dose = vancomycin 1750 mg  Vancomycin 1000 mg IV after each HD (if pt tolerates full HD session) TTS Cefepime 2 gm q HD - TTS Flagyl 500 IV q12 per MD Follow renal function, cultures and clinical course  Height: 5\' 9"  (175.3 cm) Weight: 83.9 kg (185 lb) IBW/kg (Calculated) : 70.7  Temp (24hrs), Avg:100.8 F (38.2 C), Min:99.1 F (37.3 C), Max:102.5 F (39.2 C)  Recent Labs  Lab 09/07/22 0215  WBC 23.9*  CREATININE 8.42*  LATICACIDVEN 1.3     Estimated Creatinine Clearance: 9.2 mL/min (A) (by C-G formula based on SCr of 8.42 mg/dL (H)).    Allergies  Allergen Reactions   Azithromycin Nausea And Vomiting and Other (See Comments)    Chest tightness   Iodinated Contrast Media Itching and Nausea Only    Probably needs prednisone prep prior to contrast    Antimicrobials this admission: 6/16 vanc >> 6/16 cefepime >> Dose adjustments this admission:  Microbiology results: 6/16 BCx:  6/16 Resp:  Thank you for allowing pharmacy to be a part of this patient's care.  Herby Abraham, Pharm.D Use secure chat for questions 09/07/2022 6:47 AM

## 2022-09-07 NOTE — ED Notes (Signed)
Called to give report, RN at lunch

## 2022-09-07 NOTE — Progress Notes (Signed)
PHARMACY - PHYSICIAN COMMUNICATION CRITICAL VALUE ALERT - BLOOD CULTURE IDENTIFICATION (BCID)  Ryan [name] is an 62 y.o. male who [facility] with a chief complaint of  Chief Complaint  Patient presents with   Back Pain    [name], pt reports cough x 3 days, that is hurting back. Pt also reports diarrhea when coughing. Pt on dialysis , T.Thrus. Sat. Last dialysis was 09/06/22. Fever 102.5 in triage, pt reports lack of appetite and increased fatigue.     Assessment:  discitis    Name of physician (or Provider) Contacted: Dr. David Stall  Current antibiotics: cefepime and vancomycin  Changes to prescribed antibiotics recommended:  Recommendations accepted by provider   - Ok to narrow to Ancef  - Will order 2 gr IV x1 then will dose around dialysis sessions.   No results found for this or any previous visit.   Adalberto Cole, PharmD, BCPS 09/07/2022 3:18 PM

## 2022-09-07 NOTE — ED Notes (Addendum)
ED TO INPATIENT HANDOFF REPORT  Name/Age/Gender Ryan [name] 62 y.o. male  Code Status    Code Status Orders  (From admission, onward)           Start     Ordered   09/07/22 0513  Full code  Continuous       Question:  By:  Answer:  Consent: discussion documented in EHR   09/07/22 0515           Code Status History     Date Active Date Inactive Code Status Order ID Comments User Context   03/10/2019 0322 03/12/2019 2221 Full Code [id number]  Briscoe Deutscher, MD Inpatient   12/25/2017 2214 12/30/2017 1518 Full Code [id number]  Eduard Clos, MD ED   10/18/2017 2017 10/19/2017 2357 Full Code [id number]  Hillary Bow, DO ED   09/19/2017 1549 09/25/2017 1626 Full Code [id number]  Marcos Eke, PA-C ED   08/26/2016 0053 08/29/2016 0035 Full Code [id number]  Clydie Braun, MD ED   03/03/2016 2053 03/05/2016 0646 Full Code [id number]  Hillary Bow, DO ED       Home/SNF/Other    Chief Complaint Acute low back pain [M54.50]  Level of Care/Admitting Diagnosis ED Disposition     ED Disposition  Admit   Condition  --   Comment  Hospital Area: [facility] [[id number]]  Level of Care: Telemetry Medical [104]  May admit patient to [facility] or [facility] if equivalent level of care is available:: No  Covid Evaluation: Asymptomatic - no recent exposure (last 10 days) testing not required  Diagnosis: Acute low back pain [340906]  Admitting Physician: Briscoe Deutscher [[id number]]  Attending Physician: Briscoe Deutscher [[id number]]  Certification:: I certify this patient will need inpatient services for at least 2 midnights  Estimated Length of Stay: 3          Medical History Past Medical History:  Diagnosis Date   Allergy    Anemia    Blood transfusion without reported diagnosis    Dialysis patient (HCC)    Tues,thurs,sat   ESRD (end stage renal disease) (HCC)    [facility]-    Family history of adverse reaction to anesthesia    father had  allergic reaction with anesthesia with a dental procedure-(pt. doesn't know)   Hyperlipidemia    Hypertension     Allergies Allergies  Allergen Reactions   Azithromycin Nausea And Vomiting and Other (See Comments)    Chest tightness   Iodinated Contrast Media Itching and Nausea Only    Probably needs prednisone prep prior to contrast    IV Location/Drains/Wounds Patient Lines/Drains/Airways Status     Active Line/Drains/Airways     Name Placement date Placement time Site Days   Peripheral IV 09/07/22 20 G Right Antecubital 09/07/22  0209  Antecubital  less than 1   Fistula / Graft Left Upper arm Arteriovenous fistula 08/28/16  1030  Upper arm  2201   Fistula / Graft Left Upper arm Arteriovenous fistula --  --  Upper arm  --   Hemodialysis Catheter Right Internal jugular Permanent 12/21/17  1143  Internal jugular  1721   Hemodialysis Catheter Right Subclavian --  --  Subclavian  --            Labs/Imaging Results for orders placed or performed during the hospital encounter of 09/07/22 (from the past 48 hour(s))  Lactic acid, plasma     Status: None   Collection Time:  09/07/22  2:15 AM  Result Value Ref Range   Lactic Acid, Venous 1.3 0.5 - 1.9 mmol/L    Comment: Performed at [facility], [address], [place], [place] [address]  Blood culture (routine x 2)     Status: None (Preliminary result)   Collection Time: 09/07/22  2:15 AM   Specimen: BLOOD  Result Value Ref Range   Specimen Description      BLOOD LEFT ANTECUBITAL Performed at [facility], [address] [place], [place] [address]    Special Requests      BOTTLES DRAWN AEROBIC AND ANAEROBIC Blood Culture adequate volume Performed at [facility], [address], [place], [place] [address]    Culture  Setup Time      GRAM POSITIVE COCCI IN BOTH AEROBIC AND ANAEROBIC BOTTLES Performed at [facility], [address], [place], [place]  [address]    Culture PENDING    Report Status PENDING   CBC with Differential     Status: Abnormal   Collection Time: 09/07/22  2:15 AM  Result Value Ref Range   WBC 23.9 (H) 4.0 - 10.5 K/uL   RBC 3.95 (L) 4.22 - 5.81 MIL/uL   Hemoglobin 11.1 (L) 13.0 - 17.0 g/dL   HCT 29.5 (L) 62.1 - 30.8 %   MCV 87.8 80.0 - 100.0 fL   MCH 28.1 26.0 - 34.0 pg   MCHC 32.0 30.0 - 36.0 g/dL   RDW 65.7 84.6 - 96.2 %   Platelets 154 150 - 400 K/uL   nRBC 0.0 0.0 - 0.2 %   Neutrophils Relative % 88 %   Neutro Abs 21.0 (H) 1.7 - 7.7 K/uL   Lymphocytes Relative 2 %   Lymphs Abs 0.4 (L) 0.7 - 4.0 K/uL   Monocytes Relative 9 %   Monocytes Absolute 2.1 (H) 0.1 - 1.0 K/uL   Eosinophils Relative 0 %   Eosinophils Absolute 0.1 0.0 - 0.5 K/uL   Basophils Relative 0 %   Basophils Absolute 0.1 0.0 - 0.1 K/uL   Immature Granulocytes 1 %   Abs Immature Granulocytes 0.29 (H) 0.00 - 0.07 K/uL    Comment: Performed at [facility], [address], [place], [place] [address]  Comprehensive metabolic panel     Status: Abnormal   Collection Time: 09/07/22  2:15 AM  Result Value Ref Range   Sodium 135 135 - 145 mmol/L   Potassium 4.1 3.5 - 5.1 mmol/L   Chloride 91 (L) 98 - 111 mmol/L   CO2 28 22 - 32 mmol/L   Glucose, Bld 142 (H) 70 - 99 mg/dL    Comment: Glucose reference range applies only to samples taken after fasting for at least 8 hours.   BUN 43 (H) 8 - 23 mg/dL   Creatinine, Ser 1.32 (H) 0.61 - 1.24 mg/dL   Calcium 9.0 8.9 - 44.0 mg/dL   Total Protein 8.3 (H) 6.5 - 8.1 g/dL   Albumin 3.2 (L) 3.5 - 5.0 g/dL   AST 53 (H) 15 - 41 U/L   ALT 34 0 - 44 U/L   Alkaline Phosphatase 86 38 - 126 U/L   Total Bilirubin 0.6 0.3 - 1.2 mg/dL   GFR, Estimated 7 (L) >60 mL/min    Comment: (NOTE) Calculated using the CKD-EPI Creatinine Equation (2021)    Anion gap 16 (H) 5 - 15    Comment: Performed at [facility], [address] [place], [place] [address]  Troponin I (High  Sensitivity)     Status: Abnormal   Collection Time: 09/07/22  2:15 AM  Result Value Ref Range   Troponin I (High Sensitivity) 92 (H) <18 ng/L    Comment: (NOTE) Elevated high sensitivity troponin I (hsTnI) values and significant  changes across serial measurements may suggest ACS but many other  chronic and acute conditions are known to elevate hsTnI results.  Refer to the "Links" section for chest pain algorithms and additional  guidance. Performed at [facility], [address] [place], [place] [address]   Brain natriuretic peptide     Status: Abnormal   Collection Time: 09/07/22  2:15 AM  Result Value Ref Range   B Natriuretic Peptide 433.4 (H) 0.0 - 100.0 pg/mL    Comment: Performed at [facility], [address], [place], [place] [address]  Blood culture (routine x 2)     Status: None (Preliminary result)   Collection Time: 09/07/22  2:51 AM   Specimen: BLOOD  Result Value Ref Range   Specimen Description      BLOOD BLOOD RIGHT HAND Performed at [facility], [address] [place], [place] [address]    Special Requests      BOTTLES DRAWN AEROBIC AND ANAEROBIC Blood Culture adequate volume Performed at [facility], [address], [place], [place] [address]    Culture  Setup Time      GRAM POSITIVE COCCI ANAEROBIC BOTTLE ONLY Organism ID to follow Performed at [facility], [address] [place], [place] [address]    Culture PENDING    Report Status PENDING   Troponin I (High Sensitivity)     Status: Abnormal   Collection Time: 09/07/22  5:45 AM  Result Value Ref Range   Troponin I (High Sensitivity) 111 (HH) <18 ng/L    Comment: DELTA CHECK NOTED CRITICAL RESULT CALLED TO, READ BACK BY AND VERIFIED WITH PERRY,A. RN AT 5784 09/07/22 MULLINS,T (NOTE) Elevated high sensitivity troponin I (hsTnI) values and significant  changes across serial measurements may suggest ACS but many  other  chronic and acute conditions are known to elevate hsTnI results.  Refer to the "Links" section for chest pain algorithms and additional  guidance. Performed at [facility], [address], [place], [place] [address]   Respiratory (~20 pathogens) panel by PCR     Status: None   Collection Time: 09/07/22  5:45 AM   Specimen: Anterior Nasal Swab; Respiratory  Result Value Ref Range   Adenovirus NOT DETECTED NOT DETECTED   Coronavirus 229E NOT DETECTED NOT DETECTED    Comment: (NOTE) The Coronavirus on the Respiratory Panel, DOES NOT test for the novel  Coronavirus (2019 nCoV)    Coronavirus HKU1 NOT DETECTED NOT DETECTED   Coronavirus NL63 NOT DETECTED NOT DETECTED   Coronavirus OC43 NOT DETECTED NOT DETECTED   Metapneumovirus NOT DETECTED NOT DETECTED   Rhinovirus / Enterovirus NOT DETECTED NOT DETECTED   Influenza A NOT DETECTED NOT DETECTED   Influenza B NOT DETECTED NOT DETECTED   Parainfluenza Virus 1 NOT DETECTED NOT DETECTED   Parainfluenza Virus 2 NOT DETECTED NOT DETECTED   Parainfluenza Virus 3 NOT DETECTED NOT DETECTED   Parainfluenza Virus 4 NOT DETECTED NOT DETECTED   Respiratory Syncytial Virus NOT DETECTED NOT DETECTED   Bordetella pertussis NOT DETECTED NOT DETECTED   Bordetella Parapertussis NOT DETECTED NOT DETECTED   Chlamydophila pneumoniae NOT  DETECTED NOT DETECTED   Mycoplasma pneumoniae NOT DETECTED NOT DETECTED    Comment: Performed at [facility], [address] [place], [place] [address]  SARS Coronavirus 2 by RT PCR (hospital order, performed in [facility] hospital lab) *cepheid single result test* Anterior Nasal Swab     Status: None   Collection Time: 09/07/22  5:45 AM   Specimen: Anterior Nasal Swab  Result Value Ref Range   SARS Coronavirus 2 by RT PCR NEGATIVE NEGATIVE    Comment: (NOTE) SARS-CoV-2 target nucleic acids are NOT DETECTED.  The SARS-CoV-2 RNA is generally detectable in upper and lower respiratory  specimens during the acute phase of infection. The lowest concentration of SARS-CoV-2 viral copies this assay can detect is 250 copies / mL. A negative result does not preclude SARS-CoV-2 infection and should not be used as the sole basis for treatment or other patient management decisions.  A negative result may occur with improper specimen collection / handling, submission of specimen other than nasopharyngeal swab, presence of viral mutation(s) within the areas targeted by this assay, and inadequate number of viral copies (<250 copies / mL). A negative result must be combined with clinical observations, patient history, and epidemiological information.  Fact Sheet for Patients:   RoadLapTop.co.za  Fact Sheet for Healthcare Providers: http://kim-miller.com/  This test is not yet approved or  cleared by the [place] FDA and has been authorized for detection and/or diagnosis of SARS-CoV-2 by FDA under an Emergency Use Authorization (EUA).  This EUA will remain in effect (meaning this test can be used) for the duration of the COVID-19 declaration under Section 564(b)(1) of the Act, 21 U.S.C. section 360bbb-3(b)(1), unless the authorization is terminated or revoked sooner.  Performed at [facility], [address], [place], [place] [address]    MR LUMBAR SPINE WO CONTRAST  Result Date: 09/07/2022 CLINICAL DATA:  62 year old male with fever of unknown origin. Dialysis patient. Cough, diarrhea. Back pain. EXAM: MRI LUMBAR SPINE WITHOUT CONTRAST TECHNIQUE: Multiplanar, multisequence MR imaging of the lumbar spine was performed. No intravenous contrast was administered. COMPARISON:  Thoracic MRI today reported separately, CT Chest, Abdomen, and Pelvis and CT lumbar spine 0339 hours today. FINDINGS: Segmentation:  Normal, concordant with the thoracic numbering today. Alignment: Stable from the CT earlier today. Maintained  lumbar lordosis. No significant scoliosis or spondylolisthesis. Vertebrae: Background heterogeneous lumbosacral and pelvic marrow signal as seen in the thoracic MRI, compatible with renal osteodystrophy demonstrated by CT. Intermittent mild and degenerative appearing endplate marrow edema from T12 through the lumbosacral junction. Such marrow edema is most pronounced at L5-S1, and there is increased STIR signal in the disc there. But no paraspinal inflammation there. See additional details of that level below. Intact visible sacrum and SI joints. Conus medullaris and cauda equina: Conus extends to the T12-L1 level. No lower spinal cord or conus signal abnormality. Fairly capacious spinal canal throughout and normal cauda equina nerve roots. Paraspinal and other soft tissues: Stable to the CT Abdomen and Pelvis this morning. No convincing paraspinal soft tissue inflammation. Disc levels: Normal for age above L5-S1. L5-S1: Disc space loss and heterogeneity, some increased central disc STIR signal. No vacuum disc by CT this morning. Mild endplate irregularity by CT there. Circumferential disc osteophyte complex, broad-based posterior component affecting the ventral epidural fat, with mild to moderate lateral recess stenosis greater on the right. No spinal stenosis. Mild to moderate bilateral degenerative foraminal stenosis. IMPRESSION: 1. Background renal osteodystrophy. Intermittent mild degenerative  appearing endplate marrow edema as seen in the thoracic spine today. However, more indeterminate changes at the L5-S1 level. Difficult to exclude Early Discitis Osteomyelitis there, although no paraspinal inflammation at this time. Recommend blood cultures, and if negative a repeat Lumbar MRI (without and with contrast preferred, but noncontrast probably would suffice) in 7-14 days. 2. Disc and endplate degeneration at L5-S1 with up to moderate involvement of the exiting L5 and descending S1 nerves. No spinal stenosis.  And age-appropriate lumbar spine degeneration otherwise. Electronically Signed   By: Odessa Fleming M.D.   On: 09/07/2022 08:47   MR THORACIC SPINE WO CONTRAST  Result Date: 09/07/2022 CLINICAL DATA:  62 year old male with fever of unknown origin. Dialysis patient. Cough, diarrhea. Back pain. EXAM: MRI THORACIC SPINE WITHOUT CONTRAST TECHNIQUE: Multiplanar, multisequence MR imaging of the thoracic spine was performed. No intravenous contrast was administered. COMPARISON:  CT Chest, Abdomen, and Pelvis 0339 hours today. FINDINGS: Limited cervical spine imaging: Some disc and endplate degeneration. Mildly heterogeneous bone marrow signal similar to that in the thoracic spine. Thoracic spine segmentation:  Appears to be normal. Alignment: Mildly exaggerated thoracic kyphosis. No spondylolisthesis or significant scoliosis. Vertebrae: Generalized heterogeneous marrow signal, likely corresponding to CT evidence of diffuse renal osteodystrophy earlier today. Mostly vague decreased T1 and T2 marrow signal. Superimposed degenerative appearing endplate changes in the thoracic spine T5 through T10 with multilevel mild focal endplate marrow edema there. No suspicious marrow edema or suspicious marrow lesion. Grossly negative visible posterior ribs. Cord: Negative. Capacious thoracic spinal canal. Conus medullaris appears normal at T12-L1. Paraspinal and other soft tissues: Stable from CT Chest, Abdomen, and Pelvis today. Negative thoracic paraspinal soft tissues. Disc levels: Widespread thoracic disc and endplate degeneration, but no significant thoracic disc herniation. No thoracic spinal stenosis. And only occasional thoracic neural foraminal stenosis (T10-T11 mild-to-moderate T10 foraminal stenosis in part due to facet hypertrophy. IMPRESSION: Background marrow signal changes likely relating to diffuse renal osteodystrophy seen by CT today. Superimposed thoracic disc and endplate degeneration, including multilevel mild  degenerative appearing endplate marrow edema in the thoracic spine. But no findings suspicious for discitis osteomyelitis at this time. No thoracic spinal stenosis. Electronically Signed   By: Odessa Fleming M.D.   On: 09/07/2022 08:39   CT L-SPINE NO CHARGE  Result Date: 09/07/2022 CLINICAL DATA:  Fever unknown origin.  Back pain. EXAM: CT LUMBAR SPINE WITHOUT CONTRAST TECHNIQUE: Multidetector CT imaging of the lumbar spine was performed without intravenous contrast administration. Multiplanar CT image reconstructions were also generated. RADIATION DOSE REDUCTION: This exam was performed according to the departmental dose-optimization program which includes automated exposure control, adjustment of the mA and/or kV according to patient size and/or use of iterative reconstruction technique. COMPARISON:  CT abdomen and pelvis with IV contrast 03/09/2019 FINDINGS: Segmentation: 5 lumbar type vertebrae. Alignment: Within normal limits. Vertebrae: There have been no significant interval changes. The bones are again noted dense consistent with renal osteodystrophy. There are prominent lucencies in the superior and inferior endplates of L2 which were noted previously and could be large Schmorl's nodes or changes of hyperparathyroidism. There are additional slight central endplate depressions L4 and 5 which were noted previously, with discogenic endplate irregularity opposite of L5-S1. No fracture or other acute abnormality is seen. No destructive or aggressive bone lesion. Paraspinal and other soft tissues: Aortoiliac atherosclerosis. Atrophic native kidneys. No acute findings. Disc levels: There are normal disc heights except for moderate disc space loss once again T11-12 and T12-L1. There are no herniated discs or  significant disc bulges from T12-L1 through L2-3. At L3-4, there is mild diffuse nonstenosing annular bulge which was seen previously. There is mild foraminal narrowing due to inferior foraminal disc bulging. No  herniation or significant canal encroachment. At L4-5, there is a mild diffuse annular bulge without herniation or canal zone stenosis. The foramina are clear. There is only slight facet spurring. At L5-S1, there is a broad-based posterior disc bulge abutting both S1 nerve roots without displacement or compression, herniation or stenosis. Due to inferior foraminal disc bulging and facet spurring there is moderate bilateral foraminal stenosis. The SI joints are patent with mild spurring. No erosive arthropathy is seen. IMPRESSION: 1. No acute osseous abnormalities.  No appreciable interval changes. 2. Lumbar degenerative changes and findings of renal osteodystrophy. 3. Prominent lucencies in the superior and inferior endplates of L2 which could be large Schmorl's nodes or changes of hyperparathyroidism. Similar findings in 2020. 4. Aortic atherosclerosis. 5. Atrophic native kidneys. Aortic Atherosclerosis (ICD10-I70.0). Electronically Signed   By: Almira Bar M.D.   On: 09/07/2022 04:47   CT CHEST ABDOMEN PELVIS WO CONTRAST  Result Date: 09/07/2022 CLINICAL DATA:  Fever of unknown origin. EXAM: CT CHEST, ABDOMEN AND PELVIS WITHOUT CONTRAST TECHNIQUE: Multidetector CT imaging of the chest, abdomen and pelvis was performed following the standard protocol without IV contrast. RADIATION DOSE REDUCTION: This exam was performed according to the departmental dose-optimization program which includes automated exposure control, adjustment of the mA and/or kV according to patient size and/or use of iterative reconstruction technique. COMPARISON:  Portable chest today, PA Lat chest 12/29/2017. Chest CT with contrast 12/25/2017, abdomen and pelvis CT with contrast 03/09/2019, abdomen and pelvis CT no contrast 07/07/2017. FINDINGS: CT CHEST FINDINGS Cardiovascular: The cardiac size is normal, was previously enlarged on all prior CTs. The pulmonary trunk remains prominent at 3.3 cm indicating arterial hypertension. No  venous distention is seen. There is a dialysis catheter via right IJ approach, the tip in the upper right atrium. There are moderate calcifications of the aortic valve leaflets, mild-to-moderate calcifications in the thoracic aorta without aneurysm. Scattered calcific plaque in the great vessels with normal great vessel branching. Mediastinum/Nodes: Numerous tiny lymph nodes are scattered throughout the mediastinum. These are less prominent than in 2019. Largest is 1.1 cm in short axis in the left prevascular space and similar to the prior study. Others are not enlarged and most of them are less than 5 mm. There is no esophageal thickening, tracheal or main bronchus filling defect. Thyroid gland and axillary spaces are unremarkable. Lungs/Pleura: Lung bases show scattered subsegmental level. There is new demonstration of a posterior basal left lower lobe 6 mm noncalcified nodule on 6:110. There is new demonstration of a 3 mm noncalcified right upper lobe nodule on 6:47. The remainder of the bilateral lungs are clear. No focal pneumonia is evident. Musculoskeletal: Bilateral subareolar gynecomastia appears similar. There are multilevel endplate Schmorl's nodes. Mild thoracic kyphosis. There is thoracic spondylosis. No aggressive osseous lesion. CT ABDOMEN PELVIS FINDINGS Hepatobiliary: The liver is 18.6 cm length and mildly steatotic. No mass is seen without contrast. The gallbladder and bile ducts are unremarkable. Pancreas: Unremarkable without contrast. Spleen: Unremarkable without contrast. Adrenals/Urinary Tract: There is no adrenal mass. The native kidneys are atrophic. There are multiple small cysts and additional too small to characterize subcentimeter hypodensities. No specific follow-up recommendation is made. There are no intrarenal stones in the native kidneys no hydronephrosis. 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 Findings consistent with chronic transplant rejection. Ureteral stent again extends from this transplant into the bladder. There is diffuse irregular bladder thickening which was seen on prior studies as well with perivesical stranding. Correlate clinically for chronic cystitis. Stomach/Bowel: The stomach, duodenum and proximal jejunum are unremarkable but there are dilated segments of distal jejunum and ileum with operative at adjuvant up to 3 cm, or decompression of the remainder. A transitional segment could not be found. Findings are concerning for a low-grade approximately mid ileal small-bowel obstruction potential due to occult adhesions or occult internal hernia. The appendix is normal caliber. There is diffuse colonic diverticulosis without evidence of diverticulitis. Vascular/Lymphatic: Aortic atherosclerosis. No enlarged abdominal or pelvic lymph nodes. Reproductive: Borderline prostate size 4.4 cm transverse. Both testicles are in the scrotal sac. Other: Small umbilical and left inguinal fat hernias. No incarcerated hernia. There is no free fluid, free hemorrhage, or free air. Musculoskeletal: Dense bones consistent with renal osteodystrophy. Lucencies in the L2 vertebral body are stable since 2020 consistent with prominent Schmorl's nodes or changes of hyperparathyroidism. There is no aggressive bone lesion regional skeletal fracture. IMPRESSION: 1. No focal pneumonia is seen. 2. 6 mm left lower lobe and 3 mm right upper lobe noncalcified nodules. Per Fleischner Society Guidelines, recommend a non-contrast Chest CT at 3-6 months, then consider another non-contrast Chest CT at 18-24 months. If patient is low risk for malignancy, non-contrast Chest CT at 18-24 months is optional. These guidelines do not apply to immunocompromised patients and patients with cancer. Follow up in patients with significant comorbidities as clinically warranted. For lung cancer screening, adhere to  Lung-RADS guidelines. Reference: Radiology. 2017; 284(1):228-43. 3. Aortic and coronary artery atherosclerosis. 4. Aortic valve leaflet calcifications. Echocardiography may be helpful to assess for valvular dysfunction. 5. Chronic prominence of the pulmonary trunk indicating arterial hypertension. 6. Numerous tiny lymph nodes in the mediastinum, less prominent than in 2019. 7. Atrophic native kidneys with increasingly calcified and atrophic right iliac fossa renal transplant. Ureteral stent again extends from this transplant into the bladder. 8. Mildly dilated segments of distal jejunum and ileum with decompression of the remainder of the small bowel. Findings concerning for a low-grade approximately mid ileal small-bowel obstruction potentially due to occult adhesions or occult internal hernia. 9. Diffuse colonic diverticulosis without evidence of diverticulitis. 10. Likely cystitis. No other findings to explain the patient's fever. 11. Renal osteodystrophy. 12. Umbilical and left inguinal fat hernias. Aortic Atherosclerosis (ICD10-I70.0). Electronically Signed   By: Almira Bar M.D.   On: 09/07/2022 04:33   DG Chest Portable 1 View  Result Date: 09/07/2022 CLINICAL DATA:  Cough and fevers EXAM: PORTABLE CHEST 1 VIEW COMPARISON:  12/29/2017 FINDINGS: Cardiac shadow is enlarged but stable. Dialysis catheter is noted in satisfactory position. Lungs are well aerated bilaterally. No focal infiltrate or effusion is seen. No bony abnormality is noted. IMPRESSION: No acute abnormality seen. Electronically Signed   By: Alcide Clever M.D.   On: 09/07/2022 02:42    Pending Labs Unresulted Labs (From admission, onward)     Start     Ordered   09/08/22 0500  HIV Antibody (routine testing w rflx)  (HIV Antibody (Routine testing w reflex) panel)  Tomorrow morning,   R        09/07/22 0515   09/08/22 0500  Basic metabolic panel  Daily,   R      09/07/22 0515   09/08/22 0500  Hepatic function panel  Tomorrow  morning,   R        09/07/22 0515   09/08/22 0500  CBC  Daily,   R      09/07/22 0515   09/07/22 0251  Blood Culture ID Panel (Reflexed)  Once,   STAT        09/07/22 0251   09/07/22 0142  Urinalysis, Routine w reflex microscopic -Urine, Clean Catch  Once,   URGENT       Question:  Specimen Source  Answer:  Urine, Clean Catch   09/07/22 0141            Vitals/Pain Today's Vitals   09/07/22 1100 09/07/22 1145 09/07/22 1229 09/07/22 1400  BP:   135/82 124/73  Pulse: 87 86 85 82  Resp:   16   Temp:   99.1 F (37.3 C)   TempSrc:   Oral   SpO2: 91% 94% 99% 94%  Weight:      Height:      PainSc:        Isolation Precautions Droplet precaution  Medications Medications  sodium chloride flush (NS) 0.9 % injection 3 mL (3 mLs Intravenous Given 09/07/22 1145)  acetaminophen (TYLENOL) tablet 650 mg (has no administration in time range)    Or  acetaminophen (TYLENOL) suppository 650 mg (has no administration in time range)  oxyCODONE (Oxy IR/ROXICODONE) immediate release tablet 5 mg (has no administration in time range)  HYDROmorphone (DILAUDID) injection 0.5-1 mg (1 mg Intravenous Given 09/07/22 1610)  senna (SENOKOT) tablet 8.6 mg (has no administration in time range)  ondansetron (ZOFRAN) tablet 4 mg (has no administration in time range)    Or  ondansetron (ZOFRAN) injection 4 mg (has no administration in time range)  metroNIDAZOLE (FLAGYL) IVPB 500 mg (0 mg Intravenous Stopped 09/07/22 0647)  fentaNYL (SUBLIMAZE) injection 50 mcg (50 mcg Intravenous Given 09/07/22 0209)  vancomycin (VANCOCIN) IVPB 1000 mg/200 mL premix (0 mg Intravenous Stopped 09/07/22 0622)  ceFEPIme (MAXIPIME) 2 g in sodium chloride 0.9 % 100 mL IVPB (0 g Intravenous Stopped 09/07/22 0350)  fentaNYL (SUBLIMAZE) injection 50 mcg (50 mcg Intravenous Given 09/07/22 0352)  morphine (PF) 4 MG/ML injection 4 mg (4 mg Intravenous Given 09/07/22 0420)  ketamine 50 mg in normal saline 5 mL (10 mg/mL) syringe (25 mg  Intravenous Given 09/07/22 0421)  methocarbamol (ROBAXIN) tablet 1,000 mg (1,000 mg Oral Given 09/07/22 0421)  vancomycin (VANCOREADY) IVPB 750 mg/150 mL (0 mg Intravenous Stopped 09/07/22 0909)    Mobility manual wheelchair

## 2022-09-07 NOTE — ED Notes (Signed)
Report called to Sherrie George, RN at [facility].  Called [facility] and they stated 1 transport ahead of this pt.  Approx 1.5 hr wait time.

## 2022-09-07 NOTE — Consult Note (Signed)
Reason for Consult: CT finding of questionable partial small bowel obstruction Referring Physician: Rancour MD  Ryan [name] is an 62 y.o. male.  HPI: Asked to see 62 year old male who presented to the emergency room with back pain last night.  The pain was low back pain accompanied with spasms.  He also had incontinence and weakness of his right leg especially when he coughed.  He had incontinence of stool.  Also has a history of a chronic cough.  COVID testing today was negative.  CT scan showed minimally dilated small bowel.  This was read as a possible small bowel obstruction.  His bowels are moving.  He denies any abdominal pain during this time.  He has no nausea or vomiting.  Past Medical History:  Diagnosis Date   Allergy    Anemia    Blood transfusion without reported diagnosis    Dialysis patient (HCC)    Tues,thurs,sat   ESRD (end stage renal disease) (HCC)    TTHS-    Family history of adverse reaction to anesthesia    father had allergic reaction with anesthesia with a dental procedure-(pt. doesn't know)   Hyperlipidemia    Hypertension     Past Surgical History:  Procedure Laterality Date   A/V FISTULAGRAM Left 12/15/2016   Procedure: A/V Fistulagram - left;  Surgeon: Chuck Hint, MD;  Location: [facility] INVASIVE CV LAB;  Service: Cardiovascular;  Laterality: Left;   AV FISTULA PLACEMENT Left 08/28/2016   Procedure: LEFT UPPER  ARM ARTERIOVENOUS (AV) FISTULA CREATION;  Surgeon: Chuck Hint, MD;  Location: [facility] OR;  Service: Vascular;  Laterality: Left;   DIALYSIS/PERMA CATHETER INSERTION Right 12/21/2017   Procedure: INSERTION OF DIALYSIS CATHETER Right Internal Jugular .;  Surgeon: Sherren Kerns, MD;  Location: [facility] OR;  Service: Vascular;  Laterality: Right;   FISTULA SUPERFICIALIZATION Left 09/23/2017   Procedure: FISTULA PLICATION LEFT ARM;  Surgeon: Sherren Kerns, MD;  Location: [facility] OR;  Service: Vascular;  Laterality: Left;   FISTULOGRAM  Left 12/21/2017   Procedure: FISTULOGRAM with Balloon Angioplasty.;  Surgeon: Sherren Kerns, MD;  Location: [facility] OR;  Service: Vascular;  Laterality: Left;   HEMATOMA EVACUATION Left 08/27/2020   Procedure: EVACUATION HEMATOMA LEFT ARM;  Surgeon: Sherren Kerns, MD;  Location: [facility] OR;  Service: Vascular;  Laterality: Left;   IR THORACENTESIS ASP PLEURAL SPACE W/IMG GUIDE  08/22/2017   1.2 L -right-sided   IR THORACENTESIS ASP PLEURAL SPACE W/IMG GUIDE  10/19/2017   KIDNEY TRANSPLANT     REVISON OF ARTERIOVENOUS FISTULA Left 12/21/2017   Procedure: PLICATION and Ligation of F LEFT ARM ARTERIOVENOUS FISTULA;  Surgeon: Sherren Kerns, MD;  Location: [facility] OR;  Service: Vascular;  Laterality: Left;   REVISON OF ARTERIOVENOUS FISTULA Left 07/16/2020   Procedure: EXCISION OF LEFT ARM ARTERIOVENOUS FISTULA;  Surgeon: Sherren Kerns, MD;  Location: [facility] OR;  Service: Vascular;  Laterality: Left;   stent in kidneys     dec 2017   TRANSTHORACIC ECHOCARDIOGRAM  09/22/2017    Severe LVH.  Normal function -EF 55-60%.  GRII DD.  Moderate RV dilation with mildly reduced RV function.   Fixed right coronary cusp with very mild aortic stenosis.  The myocardium has a speckled appearance --> .   Recommend cardiac MRI to evaluate for amyloid   UPPER EXTREMITY VENOGRAPHY Bilateral 06/01/2020   Procedure: UPPER EXTREMITY VENOGRAPHY;  Surgeon: Chuck Hint, MD;  Location: [facility] INVASIVE CV LAB;  Service: Cardiovascular;  Laterality:  Bilateral;    Family History  Problem Relation Age of Onset   Hypertension Mother    Heart disease Mother 21       By his report, he thinks that she had heart attack.   Stroke Mother    Cancer Father    Kidney cancer Father    Hypertension Sister    Heart disease Maternal Grandmother    Alcohol abuse Maternal Grandfather    Mental illness Paternal Grandmother    Learning disabilities Paternal Grandmother        Alzheimer's    Stroke Paternal Grandfather    Colon  cancer Neg Hx    Colon polyps Neg Hx    Esophageal cancer Neg Hx    Rectal cancer Neg Hx    Stomach cancer Neg Hx     Social History:  reports that he has never smoked. He has never used smokeless tobacco. He reports that he does not drink alcohol and does not use drugs.  Allergies:  Allergies  Allergen Reactions   Azithromycin Nausea And Vomiting and Other (See Comments)    Chest tightness   Iodinated Contrast Media Itching and Nausea Only    Probably needs prednisone prep prior to contrast    Medications: I have reviewed the patient's current medications.  Results for orders placed or performed during the hospital encounter of 09/07/22 (from the past 48 hour(s))  Lactic acid, plasma     Status: None   Collection Time: 09/07/22  2:15 AM  Result Value Ref Range   Lactic Acid, Venous 1.3 0.5 - 1.9 mmol/L    Comment: Performed at [facility], [address] [place], [place] [address]  CBC with Differential     Status: Abnormal   Collection Time: 09/07/22  2:15 AM  Result Value Ref Range   WBC 23.9 (H) 4.0 - 10.5 K/uL   RBC 3.95 (L) 4.22 - 5.81 MIL/uL   Hemoglobin 11.1 (L) 13.0 - 17.0 g/dL   HCT 60.4 (L) 54.0 - 98.1 %   MCV 87.8 80.0 - 100.0 fL   MCH 28.1 26.0 - 34.0 pg   MCHC 32.0 30.0 - 36.0 g/dL   RDW 19.1 47.8 - 29.5 %   Platelets 154 150 - 400 K/uL   nRBC 0.0 0.0 - 0.2 %   Neutrophils Relative % 88 %   Neutro Abs 21.0 (H) 1.7 - 7.7 K/uL   Lymphocytes Relative 2 %   Lymphs Abs 0.4 (L) 0.7 - 4.0 K/uL   Monocytes Relative 9 %   Monocytes Absolute 2.1 (H) 0.1 - 1.0 K/uL   Eosinophils Relative 0 %   Eosinophils Absolute 0.1 0.0 - 0.5 K/uL   Basophils Relative 0 %   Basophils Absolute 0.1 0.0 - 0.1 K/uL   Immature Granulocytes 1 %   Abs Immature Granulocytes 0.29 (H) 0.00 - 0.07 K/uL    Comment: Performed at [facility], [address] [place], [place] [address]  Comprehensive metabolic panel     Status: Abnormal   Collection  Time: 09/07/22  2:15 AM  Result Value Ref Range   Sodium 135 135 - 145 mmol/L   Potassium 4.1 3.5 - 5.1 mmol/L   Chloride 91 (L) 98 - 111 mmol/L   CO2 28 22 - 32 mmol/L   Glucose, Bld 142 (H) 70 - 99 mg/dL    Comment: Glucose reference range applies only to samples taken after fasting for at least 8 hours.   BUN 43 (H)  8 - 23 mg/dL   Creatinine, Ser 4.78 (H) 0.61 - 1.24 mg/dL   Calcium 9.0 8.9 - 29.5 mg/dL   Total Protein 8.3 (H) 6.5 - 8.1 g/dL   Albumin 3.2 (L) 3.5 - 5.0 g/dL   AST 53 (H) 15 - 41 U/L   ALT 34 0 - 44 U/L   Alkaline Phosphatase 86 38 - 126 U/L   Total Bilirubin 0.6 0.3 - 1.2 mg/dL   GFR, Estimated 7 (L) >60 mL/min    Comment: (NOTE) Calculated using the CKD-EPI Creatinine Equation (2021)    Anion gap 16 (H) 5 - 15    Comment: Performed at [facility], [address], [place], [place] [address]  Troponin I (High Sensitivity)     Status: Abnormal   Collection Time: 09/07/22  2:15 AM  Result Value Ref Range   Troponin I (High Sensitivity) 92 (H) <18 ng/L    Comment: (NOTE) Elevated high sensitivity troponin I (hsTnI) values and significant  changes across serial measurements may suggest ACS but many other  chronic and acute conditions are known to elevate hsTnI results.  Refer to the "Links" section for chest pain algorithms and additional  guidance. Performed at [facility], [address], [place], [place] [address]   Brain natriuretic peptide     Status: Abnormal   Collection Time: 09/07/22  2:15 AM  Result Value Ref Range   B Natriuretic Peptide 433.4 (H) 0.0 - 100.0 pg/mL    Comment: Performed at [facility], [address], [place], [place] [address]  Troponin I (High Sensitivity)     Status: Abnormal   Collection Time: 09/07/22  5:45 AM  Result Value Ref Range   Troponin I (High Sensitivity) 111 (HH) <18 ng/L    Comment: DELTA CHECK NOTED CRITICAL RESULT CALLED TO, READ BACK BY AND VERIFIED  WITH PERRY,A. RN AT 9528 09/07/22 MULLINS,T (NOTE) Elevated high sensitivity troponin I (hsTnI) values and significant  changes across serial measurements may suggest ACS but many other  chronic and acute conditions are known to elevate hsTnI results.  Refer to the "Links" section for chest pain algorithms and additional  guidance. Performed at [facility], [address] [place], [place] [address]   SARS Coronavirus 2 by RT PCR (hospital order, performed in [facility] hospital lab) *cepheid single result test* Anterior Nasal Swab     Status: None   Collection Time: 09/07/22  5:45 AM   Specimen: Anterior Nasal Swab  Result Value Ref Range   SARS Coronavirus 2 by RT PCR NEGATIVE NEGATIVE    Comment: (NOTE) SARS-CoV-2 target nucleic acids are NOT DETECTED.  The SARS-CoV-2 RNA is generally detectable in upper and lower respiratory specimens during the acute phase of infection. The lowest concentration of SARS-CoV-2 viral copies this assay can detect is 250 copies / mL. A negative result does not preclude SARS-CoV-2 infection and should not be used as the sole basis for treatment or other patient management decisions.  A negative result may occur with improper specimen collection / handling, submission of specimen other than nasopharyngeal swab, presence of viral mutation(s) within the areas targeted by this assay, and inadequate number of viral copies (<250 copies / mL). A negative result must be combined with clinical observations, patient history, and epidemiological information.  Fact Sheet for Patients:   RoadLapTop.co.za  Fact Sheet for Healthcare Providers: http://kim-miller.com/  This test is not yet approved or  cleared by the [place] FDA and has been authorized for detection and/or diagnosis of SARS-CoV-2 by FDA under an Emergency Use Authorization (EUA).  This EUA will remain in effect (meaning this  test can be used) for the duration of the COVID-19 declaration under Section 564(b)(1) of the Act, 21 U.S.C. section 360bbb-3(b)(1), unless the authorization is terminated or revoked sooner.  Performed at [facility], [address], [place], [place] [address]     MR LUMBAR SPINE WO CONTRAST  Result Date: 09/07/2022 CLINICAL DATA:  62 year old male with fever of unknown origin. Dialysis patient. Cough, diarrhea. Back pain. EXAM: MRI LUMBAR SPINE WITHOUT CONTRAST TECHNIQUE: Multiplanar, multisequence MR imaging of the lumbar spine was performed. No intravenous contrast was administered. COMPARISON:  Thoracic MRI today reported separately, CT Chest, Abdomen, and Pelvis and CT lumbar spine 0339 hours today. FINDINGS: Segmentation:  Normal, concordant with the thoracic numbering today. Alignment: Stable from the CT earlier today. Maintained lumbar lordosis. No significant scoliosis or spondylolisthesis. Vertebrae: Background heterogeneous lumbosacral and pelvic marrow signal as seen in the thoracic MRI, compatible with renal osteodystrophy demonstrated by CT. Intermittent mild and degenerative appearing endplate marrow edema from T12 through the lumbosacral junction. Such marrow edema is most pronounced at L5-S1, and there is increased STIR signal in the disc there. But no paraspinal inflammation there. See additional details of that level below. Intact visible sacrum and SI joints. Conus medullaris and cauda equina: Conus extends to the T12-L1 level. No lower spinal cord or conus signal abnormality. Fairly capacious spinal canal throughout and normal cauda equina nerve roots. Paraspinal and other soft tissues: Stable to the CT Abdomen and Pelvis this morning. No convincing paraspinal soft tissue inflammation. Disc levels: Normal for age above L5-S1. L5-S1: Disc space loss and heterogeneity, some increased central disc STIR signal. No vacuum disc by CT this morning. Mild endplate  irregularity by CT there. Circumferential disc osteophyte complex, broad-based posterior component affecting the ventral epidural fat, with mild to moderate lateral recess stenosis greater on the right. No spinal stenosis. Mild to moderate bilateral degenerative foraminal stenosis. IMPRESSION: 1. Background renal osteodystrophy. Intermittent mild degenerative appearing endplate marrow edema as seen in the thoracic spine today. However, more indeterminate changes at the L5-S1 level. Difficult to exclude Early Discitis Osteomyelitis there, although no paraspinal inflammation at this time. Recommend blood cultures, and if negative a repeat Lumbar MRI (without and with contrast preferred, but noncontrast probably would suffice) in 7-14 days. 2. Disc and endplate degeneration at L5-S1 with up to moderate involvement of the exiting L5 and descending S1 nerves. No spinal stenosis. And age-appropriate lumbar spine degeneration otherwise. Electronically Signed   By: Odessa Fleming M.D.   On: 09/07/2022 08:47   MR THORACIC SPINE WO CONTRAST  Result Date: 09/07/2022 CLINICAL DATA:  62 year old male with fever of unknown origin. Dialysis patient. Cough, diarrhea. Back pain. EXAM: MRI THORACIC SPINE WITHOUT CONTRAST TECHNIQUE: Multiplanar, multisequence MR imaging of the thoracic spine was performed. No intravenous contrast was administered. COMPARISON:  CT Chest, Abdomen, and Pelvis 0339 hours today. FINDINGS: Limited cervical spine imaging: Some disc and endplate degeneration. Mildly heterogeneous bone marrow signal similar to that in the thoracic spine. Thoracic spine segmentation:  Appears to be normal. Alignment: Mildly exaggerated thoracic kyphosis. No spondylolisthesis or significant scoliosis. Vertebrae: Generalized heterogeneous marrow signal, likely corresponding to CT evidence of diffuse renal osteodystrophy earlier today. Mostly vague decreased T1 and T2 marrow signal. Superimposed degenerative appearing endplate  changes in the thoracic spine T5 through T10 with multilevel  mild focal endplate marrow edema there. No suspicious marrow edema or suspicious marrow lesion. Grossly negative visible posterior ribs. Cord: Negative. Capacious thoracic spinal canal. Conus medullaris appears normal at T12-L1. Paraspinal and other soft tissues: Stable from CT Chest, Abdomen, and Pelvis today. Negative thoracic paraspinal soft tissues. Disc levels: Widespread thoracic disc and endplate degeneration, but no significant thoracic disc herniation. No thoracic spinal stenosis. And only occasional thoracic neural foraminal stenosis (T10-T11 mild-to-moderate T10 foraminal stenosis in part due to facet hypertrophy. IMPRESSION: Background marrow signal changes likely relating to diffuse renal osteodystrophy seen by CT today. Superimposed thoracic disc and endplate degeneration, including multilevel mild degenerative appearing endplate marrow edema in the thoracic spine. But no findings suspicious for discitis osteomyelitis at this time. No thoracic spinal stenosis. Electronically Signed   By: Odessa Fleming M.D.   On: 09/07/2022 08:39   CT L-SPINE NO CHARGE  Result Date: 09/07/2022 CLINICAL DATA:  Fever unknown origin.  Back pain. EXAM: CT LUMBAR SPINE WITHOUT CONTRAST TECHNIQUE: Multidetector CT imaging of the lumbar spine was performed without intravenous contrast administration. Multiplanar CT image reconstructions were also generated. RADIATION DOSE REDUCTION: This exam was performed according to the departmental dose-optimization program which includes automated exposure control, adjustment of the mA and/or kV according to patient size and/or use of iterative reconstruction technique. COMPARISON:  CT abdomen and pelvis with IV contrast 03/09/2019 FINDINGS: Segmentation: 5 lumbar type vertebrae. Alignment: Within normal limits. Vertebrae: There have been no significant interval changes. The bones are again noted dense consistent with renal  osteodystrophy. There are prominent lucencies in the superior and inferior endplates of L2 which were noted previously and could be large Schmorl's nodes or changes of hyperparathyroidism. There are additional slight central endplate depressions L4 and 5 which were noted previously, with discogenic endplate irregularity opposite of L5-S1. No fracture or other acute abnormality is seen. No destructive or aggressive bone lesion. Paraspinal and other soft tissues: Aortoiliac atherosclerosis. Atrophic native kidneys. No acute findings. Disc levels: There are normal disc heights except for moderate disc space loss once again T11-12 and T12-L1. There are no herniated discs or significant disc bulges from T12-L1 through L2-3. At L3-4, there is mild diffuse nonstenosing annular bulge which was seen previously. There is mild foraminal narrowing due to inferior foraminal disc bulging. No herniation or significant canal encroachment. At L4-5, there is a mild diffuse annular bulge without herniation or canal zone stenosis. The foramina are clear. There is only slight facet spurring. At L5-S1, there is a broad-based posterior disc bulge abutting both S1 nerve roots without displacement or compression, herniation or stenosis. Due to inferior foraminal disc bulging and facet spurring there is moderate bilateral foraminal stenosis. The SI joints are patent with mild spurring. No erosive arthropathy is seen. IMPRESSION: 1. No acute osseous abnormalities.  No appreciable interval changes. 2. Lumbar degenerative changes and findings of renal osteodystrophy. 3. Prominent lucencies in the superior and inferior endplates of L2 which could be large Schmorl's nodes or changes of hyperparathyroidism. Similar findings in 2020. 4. Aortic atherosclerosis. 5. Atrophic native kidneys. Aortic Atherosclerosis (ICD10-I70.0). Electronically Signed   By: Almira Bar M.D.   On: 09/07/2022 04:47   CT CHEST ABDOMEN PELVIS WO CONTRAST  Result  Date: 09/07/2022 CLINICAL DATA:  Fever of unknown origin. EXAM: CT CHEST, ABDOMEN AND PELVIS WITHOUT CONTRAST TECHNIQUE: Multidetector CT imaging of the chest, abdomen and pelvis was performed following the standard protocol without IV contrast. RADIATION DOSE REDUCTION: This exam was performed according to the  departmental dose-optimization program which includes automated exposure control, adjustment of the mA and/or kV according to patient size and/or use of iterative reconstruction technique. COMPARISON:  Portable chest today, PA Lat chest 12/29/2017. Chest CT with contrast 12/25/2017, abdomen and pelvis CT with contrast 03/09/2019, abdomen and pelvis CT no contrast 07/07/2017. FINDINGS: CT CHEST FINDINGS Cardiovascular: The cardiac size is normal, was previously enlarged on all prior CTs. The pulmonary trunk remains prominent at 3.3 cm indicating arterial hypertension. No venous distention is seen. There is a dialysis catheter via right IJ approach, the tip in the upper right atrium. There are moderate calcifications of the aortic valve leaflets, mild-to-moderate calcifications in the thoracic aorta without aneurysm. Scattered calcific plaque in the great vessels with normal great vessel branching. Mediastinum/Nodes: Numerous tiny lymph nodes are scattered throughout the mediastinum. These are less prominent than in 2019. Largest is 1.1 cm in short axis in the left prevascular space and similar to the prior study. Others are not enlarged and most of them are less than 5 mm. There is no esophageal thickening, tracheal or main bronchus filling defect. Thyroid gland and axillary spaces are unremarkable. Lungs/Pleura: Lung bases show scattered subsegmental level. There is new demonstration of a posterior basal left lower lobe 6 mm noncalcified nodule on 6:110. There is new demonstration of a 3 mm noncalcified right upper lobe nodule on 6:47. The remainder of the bilateral lungs are clear. No focal pneumonia is  evident. Musculoskeletal: Bilateral subareolar gynecomastia appears similar. There are multilevel endplate Schmorl's nodes. Mild thoracic kyphosis. There is thoracic spondylosis. No aggressive osseous lesion. CT ABDOMEN PELVIS FINDINGS Hepatobiliary: The liver is 18.6 cm length and mildly steatotic. No mass is seen without contrast. The gallbladder and bile ducts are unremarkable. Pancreas: Unremarkable without contrast. Spleen: Unremarkable without contrast. Adrenals/Urinary Tract: There is no adrenal mass. The native kidneys are atrophic. There are multiple small cysts and additional too small to characterize subcentimeter hypodensities. No specific follow-up recommendation is made. There are no intrarenal stones in the native kidneys no hydronephrosis. 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 Findings consistent with chronic transplant rejection. Ureteral stent again extends from this transplant into the bladder. There is diffuse irregular bladder thickening which was seen on prior studies as well with perivesical stranding. Correlate clinically for chronic cystitis. Stomach/Bowel: The stomach, duodenum and proximal jejunum are unremarkable but there are dilated segments of distal jejunum and ileum with operative at adjuvant up to 3 cm, or decompression of the remainder. A transitional segment could not be found. Findings are concerning for a low-grade approximately mid ileal small-bowel obstruction potential due to occult adhesions or occult internal hernia. The appendix is normal caliber. There is diffuse colonic diverticulosis without evidence of diverticulitis. Vascular/Lymphatic: Aortic atherosclerosis. No enlarged abdominal or pelvic lymph nodes. Reproductive: Borderline prostate size 4.4 cm transverse. Both testicles are in the scrotal sac. Other: Small umbilical and left inguinal fat hernias. No  incarcerated hernia. There is no free fluid, free hemorrhage, or free air. Musculoskeletal: Dense bones consistent with renal osteodystrophy. Lucencies in the L2 vertebral body are stable since 2020 consistent with prominent Schmorl's nodes or changes of hyperparathyroidism. There is no aggressive bone lesion regional skeletal fracture. IMPRESSION: 1. No focal pneumonia is seen. 2. 6 mm left lower lobe and 3 mm right upper lobe noncalcified nodules. Per Fleischner Society Guidelines, recommend a non-contrast Chest CT at 3-6 months, then consider another non-contrast Chest  CT at 18-24 months. If patient is low risk for malignancy, non-contrast Chest CT at 18-24 months is optional. These guidelines do not apply to immunocompromised patients and patients with cancer. Follow up in patients with significant comorbidities as clinically warranted. For lung cancer screening, adhere to Lung-RADS guidelines. Reference: Radiology. 2017; 284(1):228-43. 3. Aortic and coronary artery atherosclerosis. 4. Aortic valve leaflet calcifications. Echocardiography may be helpful to assess for valvular dysfunction. 5. Chronic prominence of the pulmonary trunk indicating arterial hypertension. 6. Numerous tiny lymph nodes in the mediastinum, less prominent than in 2019. 7. Atrophic native kidneys with increasingly calcified and atrophic right iliac fossa renal transplant. Ureteral stent again extends from this transplant into the bladder. 8. Mildly dilated segments of distal jejunum and ileum with decompression of the remainder of the small bowel. Findings concerning for a low-grade approximately mid ileal small-bowel obstruction potentially due to occult adhesions or occult internal hernia. 9. Diffuse colonic diverticulosis without evidence of diverticulitis. 10. Likely cystitis. No other findings to explain the patient's fever. 11. Renal osteodystrophy. 12. Umbilical and left inguinal fat hernias. Aortic Atherosclerosis (ICD10-I70.0).  Electronically Signed   By: Almira Bar M.D.   On: 09/07/2022 04:33   DG Chest Portable 1 View  Result Date: 09/07/2022 CLINICAL DATA:  Cough and fevers EXAM: PORTABLE CHEST 1 VIEW COMPARISON:  12/29/2017 FINDINGS: Cardiac shadow is enlarged but stable. Dialysis catheter is noted in satisfactory position. Lungs are well aerated bilaterally. No focal infiltrate or effusion is seen. No bony abnormality is noted. IMPRESSION: No acute abnormality seen. Electronically Signed   By: Alcide Clever M.D.   On: 09/07/2022 02:42    Review of Systems  Gastrointestinal:  Negative for abdominal pain, nausea and vomiting.  Musculoskeletal:  Positive for back pain.  Neurological:  Positive for weakness and numbness.  All other systems reviewed and are negative.  Blood pressure (!) 154/69, pulse 87, temperature 99.1 F (37.3 C), temperature source Oral, resp. rate (!) 24, height 5\' 9"  (1.753 m), weight 83.9 kg, SpO2 94 %. Physical Exam HENT:     Head: Normocephalic.  Abdominal:     Palpations: Abdomen is soft.     Tenderness: There is no abdominal tenderness.     Hernia: A hernia is present. Hernia is present in the umbilical area and left inguinal area.       Comments: Hernias is reducible.  Musculoskeletal:        General: Normal range of motion.  Skin:    General: Skin is warm.  Neurological:     General: No focal deficit present.     Mental Status: He is alert.     Assessment/Plan: Back pain  No clinical signs of bowel obstruction.  No exam signs of bowel obstruction.  Reviewed CT scan myself.  Changes are quite minimal.  Recommend regular diet and management for primary service.  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   Chronic diastolic heart failure, NYHA class 3 (HCC) 10/18/2017   Hyperlipidemia 09/19/2017  Shortness of breath 09/19/2017   PND (paroxysmal nocturnal dyspnea) 09/14/2017   DOE (dyspnea on exertion) 09/14/2017   Recurrent right pleural effusion 08/28/2017   ESRD on dialysis (HCC) 08/27/2017   Lesion of vertebra 07/13/2017   Mild protein-calorie malnutrition (HCC) 05/23/2017   Fluid overload, unspecified 05/06/2017   Hyperkalemia 05/01/2017   Personal history of urinary (tract) infections 01/15/2017   Encounter for fitting and adjustment of extracorporeal dialysis catheter (HCC) 09/27/2016   Coagulation defect, unspecified (HCC) 08/28/2016   Kidney transplant failure 08/28/2016   Secondary hyperparathyroidism of renal origin (HCC) 08/28/2016   Type 2 diabetes mellitus with diabetic chronic kidney disease (HCC) 08/28/2016   Renal transplant, status post 08/26/2016   Hypertension 08/26/2016   Anemia of chronic disease 04/09/2016   EKG abnormalities    Other hydronephrosis 03/04/2016    Clovis Pu Loyce Klasen MD 09/07/2022, 9:18 AM    High complexity

## 2022-09-07 NOTE — ED Notes (Signed)
Pt is sleeping soundly. Breathing normal.

## 2022-09-07 NOTE — Progress Notes (Signed)
Please see recent admission note:  There is an 62 year old male with past medical history of hypertension end-stage renal disease on hemodialysis comes into the ED for acute low back pain that woke him up from sleep he relates he has been having upper respiratory symptoms with a nonproductive cough that started 3 days prior to admission in the ED was found to be febrile with a Tmax 102.5 and a leukocytosis 24,000 he was started empirically on IV Vanco Flagyl and cefepime blood cultures were ordered.  CT of the L-spine showed no acute osseous abnormalities just lumbar DJD changes CT chest abdomen and pelvis showed 6 mm left lower lobe nodule noncalcified she will need a CT follow-up as an outpatient.  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  Agree with admission note will continue current management. Most of the labs and imaging are pending.

## 2022-09-07 NOTE — H&P (Signed)
History and Physical    Ryan [name] ZOX:[id number] DOB: 28-May-1960 DOA: 09/07/2022  PCP: Sharin Grave, MD   Patient coming from: Home   Chief Complaint: Low back pain   HPI: Ryan [name] is a pleasant 62 y.o. male with medical history significant for hypertension, hyperlipidemia, and ESRD on hemodialysis who presents to the emergency department with acute onset of low back pain.  Patient reports that he has been experiencing sore throat, rhinorrhea, and nonproductive cough for the past 3 days but had been able to go about his usual activities and completed dialysis yesterday.  He then woke overnight with severe low back pain.  He denies any recent fall or trauma and is unable to identify any precipitating factor for this.  The pain is severe, constant, nonradiating, and worse with any movement of his lower extremities.  He denies numbness in the lower extremities but has severe pain with any leg movement and therefore has difficulty stating whether there is weakness or not.  He has had couple episodes of fecal incontinence when he coughs, but no saddle anesthesia.  ED Course: Upon arrival to the ED, patient is found to be febrile to 39.2 C and saturating low 90s on room air with mild tachypnea, normal heart rate, and stable blood pressure.  Chest x-ray is negative for acute findings.  CT is negative for acute osseous abnormality in the lumbar spine but notable for lung nodules and mildly dilated loops of small bowel.  Labs are most notable for WBC 23,800, normal lactic acid, and troponin 92.   Blood cultures were collected in the ED and the patient was treated with vancomycin, cefepime, 2 doses of fentanyl, ketamine, morphine, and Robaxin.  MRI of the lumbar and thoracic spine was ordered but not yet performed.  Review of Systems:  All other systems reviewed and apart from HPI, are negative.  Past Medical History:  Diagnosis Date   Allergy    Anemia    Blood transfusion  without reported diagnosis    Dialysis patient (HCC)    Tues,thurs,sat   ESRD (end stage renal disease) (HCC)    TTHS-    Family history of adverse reaction to anesthesia    father had allergic reaction with anesthesia with a dental procedure-(pt. doesn't know)   Hyperlipidemia    Hypertension     Past Surgical History:  Procedure Laterality Date   A/V FISTULAGRAM Left 12/15/2016   Procedure: A/V Fistulagram - left;  Surgeon: Chuck Hint, MD;  Location: [facility] INVASIVE CV LAB;  Service: Cardiovascular;  Laterality: Left;   AV FISTULA PLACEMENT Left 08/28/2016   Procedure: LEFT UPPER  ARM ARTERIOVENOUS (AV) FISTULA CREATION;  Surgeon: Chuck Hint, MD;  Location: [facility] OR;  Service: Vascular;  Laterality: Left;   DIALYSIS/PERMA CATHETER INSERTION Right 12/21/2017   Procedure: INSERTION OF DIALYSIS CATHETER Right Internal Jugular .;  Surgeon: Sherren Kerns, MD;  Location: [facility] OR;  Service: Vascular;  Laterality: Right;   FISTULA SUPERFICIALIZATION Left 09/23/2017   Procedure: FISTULA PLICATION LEFT ARM;  Surgeon: Sherren Kerns, MD;  Location: [facility] OR;  Service: Vascular;  Laterality: Left;   FISTULOGRAM Left 12/21/2017   Procedure: FISTULOGRAM with Balloon Angioplasty.;  Surgeon: Sherren Kerns, MD;  Location: [facility] OR;  Service: Vascular;  Laterality: Left;   HEMATOMA EVACUATION Left 08/27/2020   Procedure: EVACUATION HEMATOMA LEFT ARM;  Surgeon: Sherren Kerns, MD;  Location: [facility] OR;  Service: Vascular;  Laterality: Left;   IR THORACENTESIS ASP  PLEURAL SPACE W/IMG GUIDE  08/22/2017   1.2 L -right-sided   IR THORACENTESIS ASP PLEURAL SPACE W/IMG GUIDE  10/19/2017   KIDNEY TRANSPLANT     REVISON OF ARTERIOVENOUS FISTULA Left 12/21/2017   Procedure: PLICATION and Ligation of F LEFT ARM ARTERIOVENOUS FISTULA;  Surgeon: Sherren Kerns, MD;  Location: [facility] OR;  Service: Vascular;  Laterality: Left;   REVISON OF ARTERIOVENOUS FISTULA Left 07/16/2020   Procedure:  EXCISION OF LEFT ARM ARTERIOVENOUS FISTULA;  Surgeon: Sherren Kerns, MD;  Location: [facility] OR;  Service: Vascular;  Laterality: Left;   stent in kidneys     dec 2017   TRANSTHORACIC ECHOCARDIOGRAM  09/22/2017    Severe LVH.  Normal function -EF 55-60%.  GRII DD.  Moderate RV dilation with mildly reduced RV function.   Fixed right coronary cusp with very mild aortic stenosis.  The myocardium has a speckled appearance --> .   Recommend cardiac MRI to evaluate for amyloid   UPPER EXTREMITY VENOGRAPHY Bilateral 06/01/2020   Procedure: UPPER EXTREMITY VENOGRAPHY;  Surgeon: Chuck Hint, MD;  Location: [facility] INVASIVE CV LAB;  Service: Cardiovascular;  Laterality: Bilateral;    Social History:   reports that he has never smoked. He has never used smokeless tobacco. He reports that he does not drink alcohol and does not use drugs.  Allergies  Allergen Reactions   Azithromycin Nausea And Vomiting and Other (See Comments)    Chest tightness   Iodinated Contrast Media Itching and Nausea Only    Probably needs prednisone prep prior to contrast    Family History  Problem Relation Age of Onset   Hypertension Mother    Heart disease Mother 78       By his report, he thinks that she had heart attack.   Stroke Mother    Cancer Father    Kidney cancer Father    Hypertension Sister    Heart disease Maternal Grandmother    Alcohol abuse Maternal Grandfather    Mental illness Paternal Grandmother    Learning disabilities Paternal Grandmother        Alzheimer's    Stroke Paternal Grandfather    Colon cancer Neg Hx    Colon polyps Neg Hx    Esophageal cancer Neg Hx    Rectal cancer Neg Hx    Stomach cancer Neg Hx      Prior to Admission medications   Medication Sig Start Date End Date Taking? Authorizing Provider  aspirin 325 MG tablet Take 325 mg by mouth daily. Patient not taking: Reported on 01/23/2022    [provider]  atenolol (TENORMIN) 25 MG tablet To take 3 times  weekly after dialysis Patient not taking: Reported on 01/23/2022 07/19/21   Alvira Monday, MD  cephALEXin (KEFLEX) 500 MG capsule Take 1 capsule (500 mg total) by mouth 2 (two) times daily. Patient not taking: Reported on 01/23/2022 08/27/20   Lars Mage, PA-C  Cetirizine HCl 10 MG CAPS Take 1 capsule (10 mg total) by mouth 1 day or 1 dose. 06/08/21 07/08/21  Redwine, Madison A, PA-C  Cholecalciferol (VITAMIN D3) 125 MCG (5000 UT) CAPS Take 5,000 Units by mouth daily.    [provider]  cinacalcet (SENSIPAR) 30 MG tablet Take 30 mg by mouth at bedtime. 12/08/17   [provider]  cyclobenzaprine (FLEXERIL) 10 MG tablet Take 1 tablet (10 mg total) by mouth 2 (two) times daily as needed for muscle spasms. 01/15/22   Carroll Sage, PA-C  EPINEPHrine 0.3 mg/0.3 mL IJ SOAJ injection Inject 0.3 mg into the muscle as needed for anaphylaxis. 06/08/21   Redwine, Madison A, PA-C  Ferrous Sulfate (IRON) 325 (65 Fe) MG TABS Take 325 mg by mouth at bedtime.    [provider]  furosemide (LASIX) 80 MG tablet TAKE 1 TABLET (80 MG TOTAL) BY MOUTH 2 (TWO) TIMES DAILY. 03/29/18   Orland Mustard, MD  LOKELMA 10 g PACK packet Take 1 packet by mouth at bedtime. 02/01/19   [provider]  multivitamin (RENA-VIT) TABS tablet Take 1 tablet by mouth daily.    [provider]  omeprazole (PRILOSEC) 20 MG capsule TAKE 1 CAPSULE BY MOUTH EVERY DAY 08/10/18   Mannam, Praveen, MD  OVER THE COUNTER MEDICATION Take 1 tablet by mouth daily. Zinc and Copper    [provider]  oxyCODONE (OXY IR/ROXICODONE) 5 MG immediate release tablet Take 1 tablet (5 mg total) by mouth every 4 (four) hours as needed (for pain score of 1-4). Patient not taking: Reported on 01/23/2022 08/27/20   Lars Mage, PA-C  pravastatin (PRAVACHOL) 80 MG tablet Take 1 tablet (80 mg total) by mouth at bedtime. Patient not taking: Reported on 01/23/2022 12/02/17   Orland Mustard, MD  sevelamer carbonate  (RENVELA) 800 MG tablet Take 1 tablet (800 mg total) by mouth 3 (three) times daily with meals. Patient taking differently: Take 800-1,600 mg by mouth See admin instructions. Take 2 tablets by mouth with each meal and 1 tablet with snacks 09/24/17   Sheikh, Omair Latif, DO  tetrahydrozoline-zinc (VISINE-AC) 0.05-0.25 % ophthalmic solution Place 2 drops into both eyes 3 (three) times daily as needed (dry eyes).    [provider]  triamcinolone cream (KENALOG) 0.1 % Apply 1 application topically daily as needed (dry spots on face/excema).    [provider]    Physical Exam: Vitals:   09/07/22 0115 09/07/22 0300 09/07/22 0315 09/07/22 0432  BP: (!) 161/83     Pulse:  95 95   Resp:  (!) 21 (!) 24   Temp:    99.1 F (37.3 C)  TempSrc:    Oral  SpO2:  94% 91%   Weight:      Height:        Constitutional: NAD, calm  Eyes: PERTLA, lids and conjunctivae normal ENMT: Mucous membranes are moist. Posterior pharynx clear of any exudate or lesions.   Neck: supple, no masses  Respiratory: no wheezing, no crackles. No accessory muscle use.  Cardiovascular: S1 & S2 heard, regular rate and rhythm. No extremity edema.   Abdomen: No distension, no tenderness, soft. Bowel sounds active.  Musculoskeletal: no clubbing / cyanosis. No joint deformity upper and lower extremities.   Skin: no significant rashes, lesions, ulcers. Warm, dry, well-perfused. Neurologic: CN 2-12 grossly intact. Sensation to light touch intact. Strength 5/5 in b/l UEs. LE strength testing limited by severe pain with movement. Alert and oriented.  Psychiatric: Pleasant. Cooperative.    Labs and Imaging on Admission: I have personally reviewed following labs and imaging studies  CBC: Recent Labs  Lab 09/07/22 0215  WBC 23.9*  NEUTROABS 21.0*  HGB 11.1*  HCT 34.7*  MCV 87.8  PLT 154   Basic Metabolic Panel: Recent Labs  Lab 09/07/22 0215  NA 135  K 4.1  CL 91*  CO2 28  GLUCOSE 142*  BUN 43*   CREATININE 8.42*  CALCIUM 9.0   GFR: Estimated Creatinine Clearance: 9.2 mL/min (A) (by C-G formula based  on SCr of 8.42 mg/dL (H)). Liver Function Tests: Recent Labs  Lab 09/07/22 0215  AST 53*  ALT 34  ALKPHOS 86  BILITOT 0.6  PROT 8.3*  ALBUMIN 3.2*   No results for input(s): "LIPASE", "AMYLASE" in the last 168 hours. No results for input(s): "AMMONIA" in the last 168 hours. Coagulation Profile: No results for input(s): "INR", "PROTIME" in the last 168 hours. Cardiac Enzymes: No results for input(s): "CKTOTAL", "CKMB", "CKMBINDEX", "TROPONINI" in the last 168 hours. BNP (last 3 results) No results for input(s): "PROBNP" in the last 8760 hours. HbA1C: No results for input(s): "HGBA1C" in the last 72 hours. CBG: No results for input(s): "GLUCAP" in the last 168 hours. Lipid Profile: No results for input(s): "CHOL", "HDL", "LDLCALC", "TRIG", "CHOLHDL", "LDLDIRECT" in the last 72 hours. Thyroid Function Tests: No results for input(s): "TSH", "T4TOTAL", "FREET4", "T3FREE", "THYROIDAB" in the last 72 hours. Anemia Panel: No results for input(s): "VITAMINB12", "FOLATE", "FERRITIN", "TIBC", "IRON", "RETICCTPCT" in the last 72 hours. Urine analysis:    Component Value Date/Time   COLORURINE RED (A) 07/12/2017 0940   APPEARANCEUR CLOUDY (A) 07/12/2017 0940   LABSPEC  07/12/2017 0940    TEST NOT REPORTED DUE TO COLOR INTERFERENCE OF URINE PIGMENT   PHURINE  07/12/2017 0940    TEST NOT REPORTED DUE TO COLOR INTERFERENCE OF URINE PIGMENT   GLUCOSEU (A) 07/12/2017 0940    TEST NOT REPORTED DUE TO COLOR INTERFERENCE OF URINE PIGMENT   HGBUR (A) 07/12/2017 0940    TEST NOT REPORTED DUE TO COLOR INTERFERENCE OF URINE PIGMENT   BILIRUBINUR (A) 07/12/2017 0940    TEST NOT REPORTED DUE TO COLOR INTERFERENCE OF URINE PIGMENT   KETONESUR (A) 07/12/2017 0940    TEST NOT REPORTED DUE TO COLOR INTERFERENCE OF URINE PIGMENT   PROTEINUR (A) 07/12/2017 0940    TEST NOT REPORTED DUE TO  COLOR INTERFERENCE OF URINE PIGMENT   NITRITE (A) 07/12/2017 0940    TEST NOT REPORTED DUE TO COLOR INTERFERENCE OF URINE PIGMENT   LEUKOCYTESUR (A) 07/12/2017 0940    TEST NOT REPORTED DUE TO COLOR INTERFERENCE OF URINE PIGMENT   Sepsis Labs: @LABRCNTIP (procalcitonin:4,lacticidven:4) )No results found for this or any previous visit (from the past 240 hour(s)).   Radiological Exams on Admission: CT L-SPINE NO CHARGE  Result Date: 09/07/2022 CLINICAL DATA:  Fever unknown origin.  Back pain. EXAM: CT LUMBAR SPINE WITHOUT CONTRAST TECHNIQUE: Multidetector CT imaging of the lumbar spine was performed without intravenous contrast administration. Multiplanar CT image reconstructions were also generated. RADIATION DOSE REDUCTION: This exam was performed according to the departmental dose-optimization program which includes automated exposure control, adjustment of the mA and/or kV according to patient size and/or use of iterative reconstruction technique. COMPARISON:  CT abdomen and pelvis with IV contrast 03/09/2019 FINDINGS: Segmentation: 5 lumbar type vertebrae. Alignment: Within normal limits. Vertebrae: There have been no significant interval changes. The bones are again noted dense consistent with renal osteodystrophy. There are prominent lucencies in the superior and inferior endplates of L2 which were noted previously and could be large Schmorl's nodes or changes of hyperparathyroidism. There are additional slight central endplate depressions L4 and 5 which were noted previously, with discogenic endplate irregularity opposite of L5-S1. No fracture or other acute abnormality is seen. No destructive or aggressive bone lesion. Paraspinal and other soft tissues: Aortoiliac atherosclerosis. Atrophic native kidneys. No acute findings. Disc levels: There are normal disc heights except for moderate disc space loss once again T11-12 and T12-L1. There are  no herniated discs or significant disc bulges from T12-L1  through L2-3. At L3-4, there is mild diffuse nonstenosing annular bulge which was seen previously. There is mild foraminal narrowing due to inferior foraminal disc bulging. No herniation or significant canal encroachment. At L4-5, there is a mild diffuse annular bulge without herniation or canal zone stenosis. The foramina are clear. There is only slight facet spurring. At L5-S1, there is a broad-based posterior disc bulge abutting both S1 nerve roots without displacement or compression, herniation or stenosis. Due to inferior foraminal disc bulging and facet spurring there is moderate bilateral foraminal stenosis. The SI joints are patent with mild spurring. No erosive arthropathy is seen. IMPRESSION: 1. No acute osseous abnormalities.  No appreciable interval changes. 2. Lumbar degenerative changes and findings of renal osteodystrophy. 3. Prominent lucencies in the superior and inferior endplates of L2 which could be large Schmorl's nodes or changes of hyperparathyroidism. Similar findings in 2020. 4. Aortic atherosclerosis. 5. Atrophic native kidneys. Aortic Atherosclerosis (ICD10-I70.0). Electronically Signed   By: Almira Bar M.D.   On: 09/07/2022 04:47   CT CHEST ABDOMEN PELVIS WO CONTRAST  Result Date: 09/07/2022 CLINICAL DATA:  Fever of unknown origin. EXAM: CT CHEST, ABDOMEN AND PELVIS WITHOUT CONTRAST TECHNIQUE: Multidetector CT imaging of the chest, abdomen and pelvis was performed following the standard protocol without IV contrast. RADIATION DOSE REDUCTION: This exam was performed according to the departmental dose-optimization program which includes automated exposure control, adjustment of the mA and/or kV according to patient size and/or use of iterative reconstruction technique. COMPARISON:  Portable chest today, PA Lat chest 12/29/2017. Chest CT with contrast 12/25/2017, abdomen and pelvis CT with contrast 03/09/2019, abdomen and pelvis CT no contrast 07/07/2017. FINDINGS: CT CHEST FINDINGS  Cardiovascular: The cardiac size is normal, was previously enlarged on all prior CTs. The pulmonary trunk remains prominent at 3.3 cm indicating arterial hypertension. No venous distention is seen. There is a dialysis catheter via right IJ approach, the tip in the upper right atrium. There are moderate calcifications of the aortic valve leaflets, mild-to-moderate calcifications in the thoracic aorta without aneurysm. Scattered calcific plaque in the great vessels with normal great vessel branching. Mediastinum/Nodes: Numerous tiny lymph nodes are scattered throughout the mediastinum. These are less prominent than in 2019. Largest is 1.1 cm in short axis in the left prevascular space and similar to the prior study. Others are not enlarged and most of them are less than 5 mm. There is no esophageal thickening, tracheal or main bronchus filling defect. Thyroid gland and axillary spaces are unremarkable. Lungs/Pleura: Lung bases show scattered subsegmental level. There is new demonstration of a posterior basal left lower lobe 6 mm noncalcified nodule on 6:110. There is new demonstration of a 3 mm noncalcified right upper lobe nodule on 6:47. The remainder of the bilateral lungs are clear. No focal pneumonia is evident. Musculoskeletal: Bilateral subareolar gynecomastia appears similar. There are multilevel endplate Schmorl's nodes. Mild thoracic kyphosis. There is thoracic spondylosis. No aggressive osseous lesion. CT ABDOMEN PELVIS FINDINGS Hepatobiliary: The liver is 18.6 cm length and mildly steatotic. No mass is seen without contrast. The gallbladder and bile ducts are unremarkable. Pancreas: Unremarkable without contrast. Spleen: Unremarkable without contrast. Adrenals/Urinary Tract: There is no adrenal mass. The native kidneys are atrophic. There are multiple small cysts and additional too small to characterize subcentimeter hypodensities. No specific follow-up recommendation is made. There are no intrarenal  stones in the native kidneys no hydronephrosis. 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 Findings consistent with chronic transplant rejection. Ureteral stent again extends from this transplant into the bladder. There is diffuse irregular bladder thickening which was seen on prior studies as well with perivesical stranding. Correlate clinically for chronic cystitis. Stomach/Bowel: The stomach, duodenum and proximal jejunum are unremarkable but there are dilated segments of distal jejunum and ileum with operative at adjuvant up to 3 cm, or decompression of the remainder. A transitional segment could not be found. Findings are concerning for a low-grade approximately mid ileal small-bowel obstruction potential due to occult adhesions or occult internal hernia. The appendix is normal caliber. There is diffuse colonic diverticulosis without evidence of diverticulitis. Vascular/Lymphatic: Aortic atherosclerosis. No enlarged abdominal or pelvic lymph nodes. Reproductive: Borderline prostate size 4.4 cm transverse. Both testicles are in the scrotal sac. Other: Small umbilical and left inguinal fat hernias. No incarcerated hernia. There is no free fluid, free hemorrhage, or free air. Musculoskeletal: Dense bones consistent with renal osteodystrophy. Lucencies in the L2 vertebral body are stable since 2020 consistent with prominent Schmorl's nodes or changes of hyperparathyroidism. There is no aggressive bone lesion regional skeletal fracture. IMPRESSION: 1. No focal pneumonia is seen. 2. 6 mm left lower lobe and 3 mm right upper lobe noncalcified nodules. Per Fleischner Society Guidelines, recommend a non-contrast Chest CT at 3-6 months, then consider another non-contrast Chest CT at 18-24 months. If patient is low risk for malignancy, non-contrast Chest CT at 18-24 months is optional. These guidelines do not apply  to immunocompromised patients and patients with cancer. Follow up in patients with significant comorbidities as clinically warranted. For lung cancer screening, adhere to Lung-RADS guidelines. Reference: Radiology. 2017; 284(1):228-43. 3. Aortic and coronary artery atherosclerosis. 4. Aortic valve leaflet calcifications. Echocardiography may be helpful to assess for valvular dysfunction. 5. Chronic prominence of the pulmonary trunk indicating arterial hypertension. 6. Numerous tiny lymph nodes in the mediastinum, less prominent than in 2019. 7. Atrophic native kidneys with increasingly calcified and atrophic right iliac fossa renal transplant. Ureteral stent again extends from this transplant into the bladder. 8. Mildly dilated segments of distal jejunum and ileum with decompression of the remainder of the small bowel. Findings concerning for a low-grade approximately mid ileal small-bowel obstruction potentially due to occult adhesions or occult internal hernia. 9. Diffuse colonic diverticulosis without evidence of diverticulitis. 10. Likely cystitis. No other findings to explain the patient's fever. 11. Renal osteodystrophy. 12. Umbilical and left inguinal fat hernias. Aortic Atherosclerosis (ICD10-I70.0). Electronically Signed   By: Almira Bar M.D.   On: 09/07/2022 04:33   DG Chest Portable 1 View  Result Date: 09/07/2022 CLINICAL DATA:  Cough and fevers EXAM: PORTABLE CHEST 1 VIEW COMPARISON:  12/29/2017 FINDINGS: Cardiac shadow is enlarged but stable. Dialysis catheter is noted in satisfactory position. Lungs are well aerated bilaterally. No focal infiltrate or effusion is seen. No bony abnormality is noted. IMPRESSION: No acute abnormality seen. Electronically Signed   By: Alcide Clever M.D.   On: 09/07/2022 02:42    EKG: Independently reviewed. Sinus rhythm.   Assessment/Plan   1. Acute low back pain  - Presents with acute atraumatic low back pain - No acute findings on CT  - Check MRI,  continue pain-control    2. SIRS  - Presents with fever, tachypnea, and leukocytosis with stable BP and normal lactate  - He has acute LBP and 3 days of URI symptoms  - Blood cultures collected in ED and empiric  antibiotics started  - Check respiratory virus panel and MRI lumbar spine, continue empiric antibiotics for now, follow cultures   3. Elevated troponin  - Troponin was 92 in ED  - He denies any recent angina and no acute ischemic features noted on EKG  - Trend troponin    4. ESRD  - Last dialyzed 6/15 per pt report  - Restrict fluids, renally-dose medications, consult nephrology for maintenance dialysis    5. Lung nodules  - Noted on CT in ED  - Outpatient follow-up recommended    DVT prophylaxis: SCDs  Code Status: Full  Level of Care: Level of care: Telemetry Medical Family Communication: none present  Disposition Plan: Patient is from: Home  Anticipated d/c is to: TBD Anticipated d/c date is: 09/10/22  Patient currently: Pending MRI, pain-control, cultures, repeat troponin  Consults called: none  Admission status: Inpatient     Briscoe Deutscher, MD [facility]  09/07/2022, 6:04 AM

## 2022-09-07 NOTE — ED Provider Notes (Addendum)
[place] EMERGENCY DEPARTMENT AT [facility] Provider Note   CSN: [id number] Arrival date & time: 09/07/22  0101     History  Chief Complaint  Patient presents with   Back Pain    [name], pt reports cough x 3 days, that is hurting back. Pt also reports diarrhea when coughing. Pt on dialysis , T.Thrus. Sat. Last dialysis was 09/06/22. Fever 102.5 in triage, pt reports lack of appetite and increased fatigue.    Ryan [name] is a 62 y.o. male.  Patient brought in by ambulance with right-sided low back pain ongoing for the past 4 to 5 hours.  Denies any fall or trauma.  States he woke up with severe pain to his right mid and low back and was unable to get up for several hours.  He is never had this pain before.  Associated with some numbness and weakness in his right leg.  Has had several episodes of bowel incontinence after coughing.  States he has been coughing for about 3 days and had viral URI symptoms including cough and congestion.  His back has been hurting from coughing.  Did not know he had a fever until arrival to ED.  Denies chest pain.  Does admit to abdominal discomfort when coughing but no vomiting.  Does not make any urine.  Did not note he had a fever.  Has been incontinent of bowel several times with coughing.  Last dialysis session was today and denies any missed sessions. Denies any chest pain.  Denies any abdominal pain.  Pain is severe in his low back.  The history is provided by the patient. The history is limited by the condition of the patient.  Back Pain Associated symptoms: fever   Associated symptoms: no abdominal pain, no dysuria, no headaches and no weakness        Home Medications Prior to Admission medications   Medication Sig Start Date End Date Taking? Authorizing Provider  aspirin 325 MG tablet Take 325 mg by mouth daily. Patient not taking: Reported on 01/23/2022    [provider]  atenolol (TENORMIN) 25 MG tablet To  take 3 times weekly after dialysis Patient not taking: Reported on 01/23/2022 07/19/21   Alvira Monday, MD  cephALEXin (KEFLEX) 500 MG capsule Take 1 capsule (500 mg total) by mouth 2 (two) times daily. Patient not taking: Reported on 01/23/2022 08/27/20   Lars Mage, PA-C  Cetirizine HCl 10 MG CAPS Take 1 capsule (10 mg total) by mouth 1 day or 1 dose. 06/08/21 07/08/21  Redwine, Madison A, PA-C  Cholecalciferol (VITAMIN D3) 125 MCG (5000 UT) CAPS Take 5,000 Units by mouth daily.    [provider]  cinacalcet (SENSIPAR) 30 MG tablet Take 30 mg by mouth at bedtime. 12/08/17   [provider]  cyclobenzaprine (FLEXERIL) 10 MG tablet Take 1 tablet (10 mg total) by mouth 2 (two) times daily as needed for muscle spasms. 01/15/22   Carroll Sage, PA-C  EPINEPHrine 0.3 mg/0.3 mL IJ SOAJ injection Inject 0.3 mg into the muscle as needed for anaphylaxis. 06/08/21   Redwine, Madison A, PA-C  Ferrous Sulfate (IRON) 325 (65 Fe) MG TABS Take 325 mg by mouth at bedtime.    [provider]  furosemide (LASIX) 80 MG tablet TAKE 1 TABLET (80 MG TOTAL) BY MOUTH 2 (TWO) TIMES DAILY. 03/29/18   Orland Mustard, MD  LOKELMA 10 g PACK packet Take 1 packet by mouth at bedtime. 02/01/19  [provider]  multivitamin (RENA-VIT) TABS tablet Take 1 tablet by mouth daily.    [provider]  omeprazole (PRILOSEC) 20 MG capsule TAKE 1 CAPSULE BY MOUTH EVERY DAY 08/10/18   Mannam, Praveen, MD  OVER THE COUNTER MEDICATION Take 1 tablet by mouth daily. Zinc and Copper    [provider]  oxyCODONE (OXY IR/ROXICODONE) 5 MG immediate release tablet Take 1 tablet (5 mg total) by mouth every 4 (four) hours as needed (for pain score of 1-4). Patient not taking: Reported on 01/23/2022 08/27/20   Lars Mage, PA-C  pravastatin (PRAVACHOL) 80 MG tablet Take 1 tablet (80 mg total) by mouth at bedtime. Patient not taking: Reported on 01/23/2022 12/02/17   Orland Mustard, MD   sevelamer carbonate (RENVELA) 800 MG tablet Take 1 tablet (800 mg total) by mouth 3 (three) times daily with meals. Patient taking differently: Take 800-1,600 mg by mouth See admin instructions. Take 2 tablets by mouth with each meal and 1 tablet with snacks 09/24/17   Sheikh, Omair Latif, DO  tetrahydrozoline-zinc (VISINE-AC) 0.05-0.25 % ophthalmic solution Place 2 drops into both eyes 3 (three) times daily as needed (dry eyes).    [provider]  triamcinolone cream (KENALOG) 0.1 % Apply 1 application topically daily as needed (dry spots on face/excema).    [provider]      Allergies    Azithromycin and Iodinated contrast media    Review of Systems   Review of Systems  Constitutional:  Positive for fever. Negative for activity change and appetite change.  HENT:  Negative for congestion and rhinorrhea.   Respiratory:  Negative for cough, chest tightness and shortness of breath.   Gastrointestinal:  Negative for abdominal pain, nausea and vomiting.  Genitourinary:  Negative for dysuria and hematuria.  Musculoskeletal:  Positive for back pain and myalgias.  Skin:  Negative for rash.  Neurological:  Negative for dizziness, weakness and headaches.   all other systems are negative except as noted in the HPI and PMH.    Physical Exam Updated Vital Signs BP (!) 161/83   Temp (!) 102.5 F (39.2 C) (Oral)   Ht 5\' 9"  (1.753 m)   Wt 83.9 kg   BMI 27.32 kg/m  Physical Exam Vitals and nursing note reviewed.  Constitutional:      General: He is not in acute distress.    Appearance: He is well-developed.     Comments: Febrile  HENT:     Head: Normocephalic and atraumatic.     Mouth/Throat:     Pharynx: No oropharyngeal exudate.  Eyes:     Conjunctiva/sclera: Conjunctivae normal.     Pupils: Pupils are equal, round, and reactive to light.  Neck:     Comments: No meningismus. Cardiovascular:     Rate and Rhythm: Normal rate and regular rhythm.     Heart sounds:  Normal heart sounds. No murmur heard. Pulmonary:     Effort: Pulmonary effort is normal. No respiratory distress.     Breath sounds: Normal breath sounds.  Chest:     Chest wall: No tenderness.  Abdominal:     Palpations: Abdomen is soft.     Tenderness: There is no abdominal tenderness. There is no guarding or rebound.  Musculoskeletal:        General: Tenderness present. Normal range of motion.     Cervical back: Normal range of motion and neck supple.     Comments: Right paraspinal midline tenderness.  No step-offs. Full  strength in lower extremities.  5/5 strength in bilateral lower extremities. Ankle plantar and dorsiflexion intact. Great toe extension intact bilaterally. +2 DP and PT pulses.   Skin:    General: Skin is warm.  Neurological:     Mental Status: He is alert and oriented to person, place, and time.     Cranial Nerves: No cranial nerve deficit.     Motor: No abnormal muscle tone.     Coordination: Coordination normal.     Comments: No ataxia on finger to nose bilaterally. No pronator drift. 5/5 strength throughout. CN 2-12 intact.Equal grip strength. Sensation intact.   Psychiatric:        Behavior: Behavior normal.     ED Results / Procedures / Treatments   Labs (all labs ordered are listed, but only abnormal results are displayed) Labs Reviewed  CBC WITH DIFFERENTIAL/PLATELET - Abnormal; Notable for the following components:      Result Value   WBC 23.9 (*)    RBC 3.95 (*)    Hemoglobin 11.1 (*)    HCT 34.7 (*)    Neutro Abs 21.0 (*)    Lymphs Abs 0.4 (*)    Monocytes Absolute 2.1 (*)    Abs Immature Granulocytes 0.29 (*)    All other components within normal limits  COMPREHENSIVE METABOLIC PANEL - Abnormal; Notable for the following components:   Chloride 91 (*)    Glucose, Bld 142 (*)    BUN 43 (*)    Creatinine, Ser 8.42 (*)    Total Protein 8.3 (*)    Albumin 3.2 (*)    AST 53 (*)    GFR, Estimated 7 (*)    Anion gap 16 (*)    All other  components within normal limits  BRAIN NATRIURETIC PEPTIDE - Abnormal; Notable for the following components:   B Natriuretic Peptide 433.4 (*)    All other components within normal limits  TROPONIN I (HIGH SENSITIVITY) - Abnormal; Notable for the following components:   Troponin I (High Sensitivity) 92 (*)    All other components within normal limits  CULTURE, BLOOD (ROUTINE X 2)  CULTURE, BLOOD (ROUTINE X 2)  LACTIC ACID, PLASMA  LACTIC ACID, PLASMA  URINALYSIS, ROUTINE W REFLEX MICROSCOPIC  TROPONIN I (HIGH SENSITIVITY)    EKG EKG Interpretation  Date/Time:  Sunday September 07 2022 02:00:38 EDT Ventricular Rate:  99 PR Interval:  180 QRS Duration: 99 QT Interval:  342 QTC Calculation: 439 R Axis:   7 Text Interpretation: Sinus rhythm Probable anteroseptal infarct, old No significant change was found Confirmed by Glynn Octave [phone]) on 09/07/2022 2:33:52 AM  Radiology CT L-SPINE NO CHARGE  Result Date: 09/07/2022 CLINICAL DATA:  Fever unknown origin.  Back pain. EXAM: CT LUMBAR SPINE WITHOUT CONTRAST TECHNIQUE: Multidetector CT imaging of the lumbar spine was performed without intravenous contrast administration. Multiplanar CT image reconstructions were also generated. RADIATION DOSE REDUCTION: This exam was performed according to the departmental dose-optimization program which includes automated exposure control, adjustment of the mA and/or kV according to patient size and/or use of iterative reconstruction technique. COMPARISON:  CT abdomen and pelvis with IV contrast 03/09/2019 FINDINGS: Segmentation: 5 lumbar type vertebrae. Alignment: Within normal limits. Vertebrae: There have been no significant interval changes. The bones are again noted dense consistent with renal osteodystrophy. There are prominent lucencies in the superior and inferior endplates of L2 which were noted previously and could be large Schmorl's nodes or changes of hyperparathyroidism. There are additional  slight central endplate  depressions L4 and 5 which were noted previously, with discogenic endplate irregularity opposite of L5-S1. No fracture or other acute abnormality is seen. No destructive or aggressive bone lesion. Paraspinal and other soft tissues: Aortoiliac atherosclerosis. Atrophic native kidneys. No acute findings. Disc levels: There are normal disc heights except for moderate disc space loss once again T11-12 and T12-L1. There are no herniated discs or significant disc bulges from T12-L1 through L2-3. At L3-4, there is mild diffuse nonstenosing annular bulge which was seen previously. There is mild foraminal narrowing due to inferior foraminal disc bulging. No herniation or significant canal encroachment. At L4-5, there is a mild diffuse annular bulge without herniation or canal zone stenosis. The foramina are clear. There is only slight facet spurring. At L5-S1, there is a broad-based posterior disc bulge abutting both S1 nerve roots without displacement or compression, herniation or stenosis. Due to inferior foraminal disc bulging and facet spurring there is moderate bilateral foraminal stenosis. The SI joints are patent with mild spurring. No erosive arthropathy is seen. IMPRESSION: 1. No acute osseous abnormalities.  No appreciable interval changes. 2. Lumbar degenerative changes and findings of renal osteodystrophy. 3. Prominent lucencies in the superior and inferior endplates of L2 which could be large Schmorl's nodes or changes of hyperparathyroidism. Similar findings in 2020. 4. Aortic atherosclerosis. 5. Atrophic native kidneys. Aortic Atherosclerosis (ICD10-I70.0). Electronically Signed   By: Almira Bar M.D.   On: 09/07/2022 04:47   CT CHEST ABDOMEN PELVIS WO CONTRAST  Result Date: 09/07/2022 CLINICAL DATA:  Fever of unknown origin. EXAM: CT CHEST, ABDOMEN AND PELVIS WITHOUT CONTRAST TECHNIQUE: Multidetector CT imaging of the chest, abdomen and pelvis was performed following the  standard protocol without IV contrast. RADIATION DOSE REDUCTION: This exam was performed according to the departmental dose-optimization program which includes automated exposure control, adjustment of the mA and/or kV according to patient size and/or use of iterative reconstruction technique. COMPARISON:  Portable chest today, PA Lat chest 12/29/2017. Chest CT with contrast 12/25/2017, abdomen and pelvis CT with contrast 03/09/2019, abdomen and pelvis CT no contrast 07/07/2017. FINDINGS: CT CHEST FINDINGS Cardiovascular: The cardiac size is normal, was previously enlarged on all prior CTs. The pulmonary trunk remains prominent at 3.3 cm indicating arterial hypertension. No venous distention is seen. There is a dialysis catheter via right IJ approach, the tip in the upper right atrium. There are moderate calcifications of the aortic valve leaflets, mild-to-moderate calcifications in the thoracic aorta without aneurysm. Scattered calcific plaque in the great vessels with normal great vessel branching. Mediastinum/Nodes: Numerous tiny lymph nodes are scattered throughout the mediastinum. These are less prominent than in 2019. Largest is 1.1 cm in short axis in the left prevascular space and similar to the prior study. Others are not enlarged and most of them are less than 5 mm. There is no esophageal thickening, tracheal or main bronchus filling defect. Thyroid gland and axillary spaces are unremarkable. Lungs/Pleura: Lung bases show scattered subsegmental level. There is new demonstration of a posterior basal left lower lobe 6 mm noncalcified nodule on 6:110. There is new demonstration of a 3 mm noncalcified right upper lobe nodule on 6:47. The remainder of the bilateral lungs are clear. No focal pneumonia is evident. Musculoskeletal: Bilateral subareolar gynecomastia appears similar. There are multilevel endplate Schmorl's nodes. Mild thoracic kyphosis. There is thoracic spondylosis. No aggressive osseous lesion. CT  ABDOMEN PELVIS FINDINGS Hepatobiliary: The liver is 18.6 cm length and mildly steatotic. No mass is seen without contrast. The gallbladder and bile ducts  are unremarkable. Pancreas: Unremarkable without contrast. Spleen: Unremarkable without contrast. Adrenals/Urinary Tract: There is no adrenal mass. The native kidneys are atrophic. There are multiple small cysts and additional too small to characterize subcentimeter hypodensities. No specific follow-up recommendation is made. There are no intrarenal stones in the native kidneys no hydronephrosis. 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 Findings consistent with chronic transplant rejection. Ureteral stent again extends from this transplant into the bladder. There is diffuse irregular bladder thickening which was seen on prior studies as well with perivesical stranding. Correlate clinically for chronic cystitis. Stomach/Bowel: The stomach, duodenum and proximal jejunum are unremarkable but there are dilated segments of distal jejunum and ileum with operative at adjuvant up to 3 cm, or decompression of the remainder. A transitional segment could not be found. Findings are concerning for a low-grade approximately mid ileal small-bowel obstruction potential due to occult adhesions or occult internal hernia. The appendix is normal caliber. There is diffuse colonic diverticulosis without evidence of diverticulitis. Vascular/Lymphatic: Aortic atherosclerosis. No enlarged abdominal or pelvic lymph nodes. Reproductive: Borderline prostate size 4.4 cm transverse. Both testicles are in the scrotal sac. Other: Small umbilical and left inguinal fat hernias. No incarcerated hernia. There is no free fluid, free hemorrhage, or free air. Musculoskeletal: Dense bones consistent with renal osteodystrophy. Lucencies in the L2 vertebral body are stable since 2020 consistent with  prominent Schmorl's nodes or changes of hyperparathyroidism. There is no aggressive bone lesion regional skeletal fracture. IMPRESSION: 1. No focal pneumonia is seen. 2. 6 mm left lower lobe and 3 mm right upper lobe noncalcified nodules. Per Fleischner Society Guidelines, recommend a non-contrast Chest CT at 3-6 months, then consider another non-contrast Chest CT at 18-24 months. If patient is low risk for malignancy, non-contrast Chest CT at 18-24 months is optional. These guidelines do not apply to immunocompromised patients and patients with cancer. Follow up in patients with significant comorbidities as clinically warranted. For lung cancer screening, adhere to Lung-RADS guidelines. Reference: Radiology. 2017; 284(1):228-43. 3. Aortic and coronary artery atherosclerosis. 4. Aortic valve leaflet calcifications. Echocardiography may be helpful to assess for valvular dysfunction. 5. Chronic prominence of the pulmonary trunk indicating arterial hypertension. 6. Numerous tiny lymph nodes in the mediastinum, less prominent than in 2019. 7. Atrophic native kidneys with increasingly calcified and atrophic right iliac fossa renal transplant. Ureteral stent again extends from this transplant into the bladder. 8. Mildly dilated segments of distal jejunum and ileum with decompression of the remainder of the small bowel. Findings concerning for a low-grade approximately mid ileal small-bowel obstruction potentially due to occult adhesions or occult internal hernia. 9. Diffuse colonic diverticulosis without evidence of diverticulitis. 10. Likely cystitis. No other findings to explain the patient's fever. 11. Renal osteodystrophy. 12. Umbilical and left inguinal fat hernias. Aortic Atherosclerosis (ICD10-I70.0). Electronically Signed   By: Almira Bar M.D.   On: 09/07/2022 04:33   DG Chest Portable 1 View  Result Date: 09/07/2022 CLINICAL DATA:  Cough and fevers EXAM: PORTABLE CHEST 1 VIEW COMPARISON:  12/29/2017  FINDINGS: Cardiac shadow is enlarged but stable. Dialysis catheter is noted in satisfactory position. Lungs are well aerated bilaterally. No focal infiltrate or effusion is seen. No bony abnormality is noted. IMPRESSION: No acute abnormality seen. Electronically Signed   By: Alcide Clever M.D.   On: 09/07/2022 02:42    Procedures .Critical Care  Performed by: Glynn Octave, MD Authorized by: Glynn Octave, MD  Critical care provider statement:    Critical care time (minutes):  454   Critical care time was exclusive of:  Separately billable procedures and treating other patients   Critical care was necessary to treat or prevent imminent or life-threatening deterioration of the following conditions:  Sepsis   Critical care was time spent personally by me on the following activities:  Development of treatment plan with patient or surrogate, discussions with consultants, evaluation of patient's response to treatment, examination of patient, ordering and review of laboratory studies, ordering and review of radiographic studies, ordering and performing treatments and interventions, pulse oximetry, re-evaluation of patient's condition, review of old charts, blood draw for specimens and obtaining history from patient or surrogate   I assumed direction of critical care for this patient from another provider in my specialty: no       Medications Ordered in ED Medications  fentaNYL (SUBLIMAZE) injection 50 mcg (has no administration in time range)  vancomycin (VANCOCIN) IVPB 1000 mg/200 mL premix (has no administration in time range)  ceFEPIme (MAXIPIME) 2 g in sodium chloride 0.9 % 100 mL IVPB (has no administration in time range)    ED Course/ Medical Decision Making/ A&P                             Medical Decision Making Amount and/or Complexity of Data Reviewed Independent Historian: EMS Labs: ordered. Decision-making details documented in ED Course. Radiology: ordered and independent  interpretation performed. Decision-making details documented in ED Course. ECG/medicine tests: ordered and independent interpretation performed. Decision-making details documented in ED Course.  Risk Prescription drug management. Decision regarding hospitalization.  Low back pain with fever and bowel incontinence.  Patient has been ill for several days with cough and URI symptoms.  Does have low back pain today with fever and episodes of incontinence with coughing  Patient found to be febrile to 102.5.  No hypoxia or increased work of breathing.  Chest x-ray negative for pneumonia.  Results reviewed and interpreted by me.  He has had viral URI symptoms for several days.  No missed dialysis sessions.  No abdominal pain or vomiting but does have nausea.  He has severe right-sided low back pain with no history of same.  Equal distal strength, sensation and pulses.  With fever and low back pain there is concern for possible spinal infection such as discitis, osteomyelitis or epidural abscess.  Patient started on broad-spectrum antibiotics and IV fluids after cultures are obtained.  Does not make urine.  Labs show leukocytosis of 24.  Lactate is normal.  Does not make urine.  No pneumonia seen on chest x-ray.  CT scan is obtained for further evaluation of occult pneumonia versus obvious lumbar infection.  MRI not available currently.  CT scan does not show any pneumonia.  Does show multiple pulmonary nodules.  Some dilated loops of jejunum and ileum concerning for possible obstruction.   Patient with no abdominal pain or vomiting.  Does have some nausea.  Do not suspect bowel obstruction clinically.  Discussed with Dr. Maisie Fus of general surgery.  She indicates this patient is being transferred to [facility] would have the general surgery team at [facility] evaluate.  Message sent to Dr. Bedelia Person.  Remains having severe intractable back pain with fever and concern for possible spinal infection.  MRI is  ordered.  Will transfer to [facility] for MRI availability as well as availability of neurosurgery and nephrology.  Continue broad-spectrum antibiotics while MRI is pending.  Admission discussed with Dr. Antionette Char       Final Clinical Impression(s) / ED Diagnoses Final diagnoses:  Acute right-sided low back pain without sciatica  Fever, unspecified fever cause    Rx / DC Orders ED Discharge Orders     None         [place] Espina, Jeannett Senior, MD 09/07/22 [id number]    Glynn Octave, MD 09/07/22 [phone]

## 2022-09-07 NOTE — ED Notes (Signed)
Pt given pain Rx due to increase in pain and pt given Ice chips

## 2022-09-08 ENCOUNTER — Encounter (HOSPITAL_COMMUNITY): Payer: Self-pay | Admitting: Family Medicine

## 2022-09-08 ENCOUNTER — Inpatient Hospital Stay (HOSPITAL_COMMUNITY): Payer: No Typology Code available for payment source

## 2022-09-08 DIAGNOSIS — N250 Renal osteodystrophy: Secondary | ICD-10-CM | POA: Diagnosis not present

## 2022-09-08 DIAGNOSIS — Z992 Dependence on renal dialysis: Secondary | ICD-10-CM | POA: Diagnosis not present

## 2022-09-08 DIAGNOSIS — N186 End stage renal disease: Secondary | ICD-10-CM | POA: Diagnosis not present

## 2022-09-08 DIAGNOSIS — I059 Rheumatic mitral valve disease, unspecified: Secondary | ICD-10-CM | POA: Diagnosis not present

## 2022-09-08 DIAGNOSIS — D72829 Elevated white blood cell count, unspecified: Secondary | ICD-10-CM | POA: Diagnosis not present

## 2022-09-08 DIAGNOSIS — M4647 Discitis, unspecified, lumbosacral region: Secondary | ICD-10-CM

## 2022-09-08 DIAGNOSIS — J3489 Other specified disorders of nose and nasal sinuses: Secondary | ICD-10-CM | POA: Diagnosis not present

## 2022-09-08 DIAGNOSIS — I120 Hypertensive chronic kidney disease with stage 5 chronic kidney disease or end stage renal disease: Secondary | ICD-10-CM | POA: Diagnosis not present

## 2022-09-08 DIAGNOSIS — D631 Anemia in chronic kidney disease: Secondary | ICD-10-CM | POA: Diagnosis not present

## 2022-09-08 DIAGNOSIS — I5032 Chronic diastolic (congestive) heart failure: Secondary | ICD-10-CM

## 2022-09-08 DIAGNOSIS — R7881 Bacteremia: Secondary | ICD-10-CM | POA: Diagnosis not present

## 2022-09-08 DIAGNOSIS — B9561 Methicillin susceptible Staphylococcus aureus infection as the cause of diseases classified elsewhere: Secondary | ICD-10-CM | POA: Diagnosis not present

## 2022-09-08 DIAGNOSIS — M545 Low back pain, unspecified: Secondary | ICD-10-CM | POA: Diagnosis not present

## 2022-09-08 LAB — BASIC METABOLIC PANEL WITH GFR
Anion gap: 27 — ABNORMAL HIGH (ref 5–15)
BUN: 88 mg/dL — ABNORMAL HIGH (ref 8–23)
CO2: 23 mmol/L (ref 22–32)
Calcium: 9.4 mg/dL (ref 8.9–10.3)
Chloride: 87 mmol/L — ABNORMAL LOW (ref 98–111)
Creatinine, Ser: 12.1 mg/dL — ABNORMAL HIGH (ref 0.61–1.24)
GFR, Estimated: 4 mL/min — ABNORMAL LOW
Glucose, Bld: 133 mg/dL — ABNORMAL HIGH (ref 70–99)
Potassium: 5 mmol/L (ref 3.5–5.1)
Sodium: 137 mmol/L (ref 135–145)

## 2022-09-08 LAB — ECHOCARDIOGRAM COMPLETE BUBBLE STUDY
AR max vel: 1.88 cm2
AV Area VTI: 1.9 cm2
AV Area mean vel: 1.89 cm2
AV Mean grad: 10 mmHg
AV Peak grad: 18.9 mmHg
Ao pk vel: 2.18 m/s
Area-P 1/2: 3.08 cm2
P 1/2 time: 526 msec
S' Lateral: 3 cm

## 2022-09-08 LAB — HEPATIC FUNCTION PANEL
ALT: 58 U/L — ABNORMAL HIGH (ref 0–44)
AST: 82 U/L — ABNORMAL HIGH (ref 15–41)
Albumin: 2.7 g/dL — ABNORMAL LOW (ref 3.5–5.0)
Alkaline Phosphatase: 106 U/L (ref 38–126)
Bilirubin, Direct: 0.1 mg/dL (ref 0.0–0.2)
Indirect Bilirubin: 0.8 mg/dL (ref 0.3–0.9)
Total Bilirubin: 0.9 mg/dL (ref 0.3–1.2)
Total Protein: 8 g/dL (ref 6.5–8.1)

## 2022-09-08 LAB — CBC
HCT: 35.9 % — ABNORMAL LOW (ref 39.0–52.0)
Hemoglobin: 11.8 g/dL — ABNORMAL LOW (ref 13.0–17.0)
MCH: 27.6 pg (ref 26.0–34.0)
MCHC: 32.9 g/dL (ref 30.0–36.0)
MCV: 84.1 fL (ref 80.0–100.0)
Platelets: UNDETERMINED 10*3/uL (ref 150–400)
RBC: 4.27 MIL/uL (ref 4.22–5.81)
RDW: 14.8 % (ref 11.5–15.5)
WBC: 29.8 10*3/uL — ABNORMAL HIGH (ref 4.0–10.5)
nRBC: 0 % (ref 0.0–0.2)

## 2022-09-08 LAB — C-REACTIVE PROTEIN: CRP: 44 mg/dL — ABNORMAL HIGH (ref <1.0)

## 2022-09-08 LAB — SEDIMENTATION RATE: Sed Rate: 105 mm/hr — ABNORMAL HIGH (ref 0–16)

## 2022-09-08 LAB — CULTURE, BLOOD (ROUTINE X 2): Special Requests: ADEQUATE

## 2022-09-08 LAB — TROPONIN I (HIGH SENSITIVITY)
Troponin I (High Sensitivity): 170 ng/L
Troponin I (High Sensitivity): 139 ng/L (ref <18)
Troponin I (High Sensitivity): 134 ng/L (ref <18)

## 2022-09-08 LAB — HEPATITIS B SURFACE ANTIGEN: Hepatitis B Surface Ag: NONREACTIVE

## 2022-09-08 LAB — HIV ANTIBODY (ROUTINE TESTING W REFLEX): HIV Screen 4th Generation wRfx: NONREACTIVE

## 2022-09-08 MED ORDER — METHOCARBAMOL 1000 MG/10ML IJ SOLN
500.0000 mg | Freq: Four times a day (QID) | INTRAVENOUS | Status: DC | PRN
  Administered 2022-09-08 – 2022-09-15 (×4): 500 mg via INTRAVENOUS
  Filled 2022-09-08: qty 5
  Filled 2022-09-08 (×4): qty 500

## 2022-09-08 MED ORDER — RENA-VITE PO TABS
1.0000 | ORAL_TABLET | Freq: Every day | ORAL | Status: DC
  Administered 2022-09-08 – 2022-09-24 (×14): 1 via ORAL
  Filled 2022-09-08 (×16): qty 1

## 2022-09-08 MED ORDER — SEVELAMER CARBONATE 800 MG PO TABS
1600.0000 mg | ORAL_TABLET | Freq: Three times a day (TID) | ORAL | Status: DC
  Administered 2022-09-09 – 2022-09-10 (×4): 1600 mg via ORAL
  Filled 2022-09-08 (×5): qty 2

## 2022-09-08 MED ORDER — ATENOLOL 50 MG PO TABS
50.0000 mg | ORAL_TABLET | Freq: Every day | ORAL | Status: DC
  Administered 2022-09-08 – 2022-09-18 (×8): 50 mg via ORAL
  Filled 2022-09-08 (×13): qty 1

## 2022-09-08 MED ORDER — CINACALCET HCL 30 MG PO TABS
30.0000 mg | ORAL_TABLET | Freq: Every day | ORAL | Status: DC
  Administered 2022-09-08 – 2022-09-24 (×15): 30 mg via ORAL
  Filled 2022-09-08 (×15): qty 1

## 2022-09-08 MED ORDER — IPRATROPIUM-ALBUTEROL 0.5-2.5 (3) MG/3ML IN SOLN
RESPIRATORY_TRACT | Status: AC
  Administered 2022-09-08: 3 mL via RESPIRATORY_TRACT
  Filled 2022-09-08: qty 3

## 2022-09-08 MED ORDER — SEVELAMER CARBONATE 800 MG PO TABS: 800.0000 mg | ORAL_TABLET | ORAL | Status: DC | PRN

## 2022-09-08 MED ORDER — IPRATROPIUM-ALBUTEROL 0.5-2.5 (3) MG/3ML IN SOLN
3.0000 mL | RESPIRATORY_TRACT | Status: AC
  Filled 2022-09-08: qty 3

## 2022-09-08 MED ORDER — ALUM & MAG HYDROXIDE-SIMETH 200-200-20 MG/5ML PO SUSP
30.0000 mL | ORAL | Status: DC | PRN
  Administered 2022-09-08: 30 mL via ORAL
  Filled 2022-09-08: qty 30

## 2022-09-08 NOTE — Hospital Course (Addendum)
62 y.o. male with a history of hypertension, hyperlipidemia, ESRD on hemodialysis.  Patient presented secondary to low back pain and was found to have evidence of L5-S1 discitis/osteomyelitis in addition to MSSA bacteremia.  ID consulted.  Workup significant for possible endocarditis. Patient also found to have a small bowel obstruction.

## 2022-09-08 NOTE — Consult Note (Signed)
[facility] Renal Consultation Note    Indication for Consultation:  Management of ESRD/hemodialysis; anemia, hypertension/volume and secondary hyperparathyroidism   HPI: Ryan [name] is a 62 y.o. 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  Dialyzing at [facility] via [facility]. Last dialysis 6/15.   He presented to the ED with acute back pain, weakness, and fever. WBCs >20K on admission. MRI spine with indeterminate changes at L5-S1 unable to exclude early discitis/osteomyelitis. Blood cultures positive for MSSA. ID consulted - antibiotics narrowed and recommendation for catheter removal.   Seen and examined in room. Daughter and granddaughter at bedside. Endorses continued back pain/spasms, but in good spirits. Denies cp, sob, nausea/vomiting. Unfortunately he does not have any other access for dialysis.   Past Medical History:  Diagnosis Date   Allergy    Anemia    Blood transfusion without reported diagnosis    Dialysis patient (HCC)    Tues,thurs,sat   ESRD (end stage renal disease) (HCC)    TTHS-    Family history of adverse reaction to anesthesia    father had allergic reaction with anesthesia with a dental procedure-(pt. doesn't know)   Hyperlipidemia    Hypertension    Past Surgical History:  Procedure Laterality Date   A/V FISTULAGRAM Left 12/15/2016   Procedure: A/V Fistulagram - left;  Surgeon: Chuck Hint, MD;  Location: [facility] INVASIVE CV LAB;  Service: Cardiovascular;  Laterality: Left;   AV FISTULA PLACEMENT Left 08/28/2016   Procedure: LEFT UPPER  ARM ARTERIOVENOUS (AV) FISTULA CREATION;  Surgeon: Chuck Hint, MD;  Location: [facility] OR;  Service: Vascular;  Laterality: Left;   DIALYSIS/PERMA CATHETER INSERTION Right 12/21/2017   Procedure: INSERTION OF DIALYSIS CATHETER Right Internal Jugular .;  Surgeon: Sherren Kerns, MD;  Location: [facility] OR;   Service: Vascular;  Laterality: Right;   FISTULA SUPERFICIALIZATION Left 09/23/2017   Procedure: FISTULA PLICATION LEFT ARM;  Surgeon: Sherren Kerns, MD;  Location: [facility] OR;  Service: Vascular;  Laterality: Left;   FISTULOGRAM Left 12/21/2017   Procedure: FISTULOGRAM with Balloon Angioplasty.;  Surgeon: Sherren Kerns, MD;  Location: [facility] OR;  Service: Vascular;  Laterality: Left;   HEMATOMA EVACUATION Left 08/27/2020   Procedure: EVACUATION HEMATOMA LEFT ARM;  Surgeon: Sherren Kerns, MD;  Location: [facility] OR;  Service: Vascular;  Laterality: Left;   IR THORACENTESIS ASP PLEURAL SPACE W/IMG GUIDE  08/22/2017   1.2 L -right-sided   IR THORACENTESIS ASP PLEURAL SPACE W/IMG GUIDE  10/19/2017   KIDNEY TRANSPLANT     REVISON OF ARTERIOVENOUS FISTULA Left 12/21/2017   Procedure: PLICATION and Ligation of F LEFT ARM ARTERIOVENOUS FISTULA;  Surgeon: Sherren Kerns, MD;  Location: [facility] OR;  Service: Vascular;  Laterality: Left;   REVISON OF ARTERIOVENOUS FISTULA Left 07/16/2020   Procedure: EXCISION OF LEFT ARM ARTERIOVENOUS FISTULA;  Surgeon: Sherren Kerns, MD;  Location: [facility] OR;  Service: Vascular;  Laterality: Left;   stent in kidneys     dec 2017   TRANSTHORACIC ECHOCARDIOGRAM  09/22/2017    Severe LVH.  Normal function -EF 55-60%.  GRII DD.  Moderate RV dilation with mildly reduced RV function.   Fixed right coronary cusp with very mild aortic stenosis.  The myocardium has a speckled appearance --> .   Recommend cardiac MRI to evaluate for amyloid   UPPER EXTREMITY VENOGRAPHY Bilateral 06/01/2020   Procedure: UPPER EXTREMITY VENOGRAPHY;  Surgeon:  Chuck Hint, MD;  Location: [facility] INVASIVE CV LAB;  Service: Cardiovascular;  Laterality: Bilateral;   Family History  Problem Relation Age of Onset   Hypertension Mother    Heart disease Mother 31       By his report, he thinks that she had heart attack.   Stroke Mother    Cancer Father    Kidney cancer Father    Hypertension  Sister    Heart disease Maternal Grandmother    Alcohol abuse Maternal Grandfather    Mental illness Paternal Grandmother    Learning disabilities Paternal Grandmother        Alzheimer's    Stroke Paternal Grandfather    Colon cancer Neg Hx    Colon polyps Neg Hx    Esophageal cancer Neg Hx    Rectal cancer Neg Hx    Stomach cancer Neg Hx    Social History:  reports that he has never smoked. He has never used smokeless tobacco. He reports that he does not drink alcohol and does not use drugs. Allergies  Allergen Reactions   Azithromycin Nausea And Vomiting and Other (See Comments)    Chest tightness  Other Reaction(s): GI Intolerance   Iodinated Contrast Media Itching and Nausea Only    Probably needs prednisone prep prior to contrast   Prior to Admission medications   Medication Sig Start Date End Date Taking? Authorizing Provider  aspirin 325 MG tablet Take 325 mg by mouth daily. Patient not taking: Reported on 01/23/2022    [provider]  atenolol (TENORMIN) 25 MG tablet To take 3 times weekly after dialysis Patient not taking: Reported on 01/23/2022 07/19/21   Alvira Monday, MD  cephALEXin (KEFLEX) 500 MG capsule Take 1 capsule (500 mg total) by mouth 2 (two) times daily. Patient not taking: Reported on 01/23/2022 08/27/20   Lars Mage, PA-C  Cetirizine HCl 10 MG CAPS Take 1 capsule (10 mg total) by mouth 1 day or 1 dose. 06/08/21 09/07/22  Redwine, Madison A, PA-C  Cholecalciferol (VITAMIN D3) 125 MCG (5000 UT) CAPS Take 5,000 Units by mouth daily.    [provider]  cinacalcet (SENSIPAR) 30 MG tablet Take 30 mg by mouth at bedtime. 12/08/17   [provider]  cyclobenzaprine (FLEXERIL) 10 MG tablet Take 1 tablet (10 mg total) by mouth 2 (two) times daily as needed for muscle spasms. 01/15/22   Carroll Sage, PA-C  EPINEPHrine 0.3 mg/0.3 mL IJ SOAJ injection Inject 0.3 mg into the muscle as needed for anaphylaxis. 06/08/21   Redwine, Madison  A, PA-C  Ferrous Sulfate (IRON) 325 (65 Fe) MG TABS Take 325 mg by mouth at bedtime.    [provider]  furosemide (LASIX) 80 MG tablet TAKE 1 TABLET (80 MG TOTAL) BY MOUTH 2 (TWO) TIMES DAILY. 03/29/18   Orland Mustard, MD  LOKELMA 10 g PACK packet Take 1 packet by mouth at bedtime. 02/01/19   [provider]  multivitamin (RENA-VIT) TABS tablet Take 1 tablet by mouth daily.    [provider]  omeprazole (PRILOSEC) 20 MG capsule TAKE 1 CAPSULE BY MOUTH EVERY DAY 08/10/18   Mannam, Praveen, MD  OVER THE COUNTER MEDICATION Take 1 tablet by mouth daily. Zinc and Copper    [provider]  oxyCODONE (OXY IR/ROXICODONE) 5 MG immediate release tablet Take 1 tablet (5 mg total) by mouth every 4 (four) hours as needed (for pain score of 1-4). Patient not taking: Reported on 01/23/2022 08/27/20  Clinton Gallant M, PA-C  pravastatin (PRAVACHOL) 80 MG tablet Take 1 tablet (80 mg total) by mouth at bedtime. Patient not taking: Reported on 01/23/2022 12/02/17   Orland Mustard, MD  sevelamer carbonate (RENVELA) 800 MG tablet Take 1 tablet (800 mg total) by mouth 3 (three) times daily with meals. Patient taking differently: Take 800-1,600 mg by mouth See admin instructions. Take 2 tablets by mouth with each meal and 1 tablet with snacks 09/24/17   Sheikh, Omair Latif, DO  tetrahydrozoline-zinc (VISINE-AC) 0.05-0.25 % ophthalmic solution Place 2 drops into both eyes 3 (three) times daily as needed (dry eyes).    [provider]  triamcinolone cream (KENALOG) 0.1 % Apply 1 application topically daily as needed (dry spots on face/excema).    [provider]   Current Facility-Administered Medications  Medication Dose Route Frequency Provider Last Rate Last Admin   acetaminophen (TYLENOL) tablet 650 mg  650 mg Oral Q6H PRN Opyd, Lavone Neri, MD       Or   acetaminophen (TYLENOL) suppository 650 mg  650 mg Rectal Q6H PRN Opyd, Lavone Neri, MD       [START ON 09/09/2022]  ceFAZolin (ANCEF) IVPB 2g/100 mL premix  2 g Intravenous Q T,Th,Sa-HD Amend, Emiliano Dyer, RPH       Chlorhexidine Gluconate Cloth 2 % PADS 6 each  6 each Topical Daily Berton Mount I, MD   6 each at 09/08/22 0940   HYDROmorphone (DILAUDID) injection 0.5-1 mg  0.5-1 mg Intravenous Q3H PRN Opyd, Lavone Neri, MD   1 mg at 09/07/22 2000   lip balm (CARMEX) ointment   Topical PRN Marinda Elk, MD       ondansetron [facility]) tablet 4 mg  4 mg Oral Q6H PRN Opyd, Lavone Neri, MD       Or   ondansetron (ZOFRAN) injection 4 mg  4 mg Intravenous Q6H PRN Opyd, Lavone Neri, MD       oxyCODONE (Oxy IR/ROXICODONE) immediate release tablet 5 mg  5 mg Oral Q4H PRN Opyd, Lavone Neri, MD       senna (SENOKOT) tablet 8.6 mg  1 tablet Oral Daily PRN Opyd, Lavone Neri, MD       sodium chloride flush (NS) 0.9 % injection 3 mL  3 mL Intravenous Q12H Opyd, Lavone Neri, MD   3 mL at 09/08/22 0940     ROS: As per HPI otherwise negative.  Physical Exam: Vitals:   09/07/22 1837 09/07/22 2015 09/08/22 0628 09/08/22 0832  BP: 131/75 124/71 (!) 149/91 (!) 145/84  Pulse: 76 76 73 78  Resp: 18 19  18   Temp: 98.2 F (36.8 C) 98.5 F (36.9 C)  98.1 F (36.7 C)  TempSrc: Oral Oral Oral Oral  SpO2: 95% 93% 96% 93%  Weight:      Height:         General: Appears comfortable, in no distress  Head: NCAT sclera not icteric MMM Neck: Supple. No JVD appreciated  Lungs: Clear bilaterally without wheezes, rales, or rhonchi. Normal WOB  Heart: RRR, no murmur, rub, or gallop  Abdomen: soft non-tender, bowel sounds normal, no masses  Lower extremities:without edema or ischemic changes, no open wounds  Neuro: A & O X 3. Moves all extremities spontaneously. Psych:  Responds to questions appropriately with a normal affect. Dialysis Access: [facility]   Labs: Basic Metabolic Panel: Recent Labs  Lab 09/07/22 0215  NA 135  K 4.1  CL 91*  CO2 28  GLUCOSE 142*  BUN 43*  CREATININE 8.42*  CALCIUM 9.0   Liver Function  Tests: Recent Labs  Lab 09/07/22 0215  AST 53*  ALT 34  ALKPHOS 86  BILITOT 0.6  PROT 8.3*  ALBUMIN 3.2*   No results for input(s): "LIPASE", "AMYLASE" in the last 168 hours. No results for input(s): "AMMONIA" in the last 168 hours. CBC: Recent Labs  Lab 09/07/22 0215  WBC 23.9*  NEUTROABS 21.0*  HGB 11.1*  HCT 34.7*  MCV 87.8  PLT 154   Cardiac Enzymes: No results for input(s): "CKTOTAL", "CKMB", "CKMBINDEX", "TROPONINI" in the last 168 hours. CBG: No results for input(s): "GLUCAP" in the last 168 hours. Iron Studies: No results for input(s): "IRON", "TIBC", "TRANSFERRIN", "FERRITIN" in the last 72 hours. Studies/Results: MR LUMBAR SPINE WO CONTRAST  Result Date: 09/07/2022 CLINICAL DATA:  62 year old male with fever of unknown origin. Dialysis patient. Cough, diarrhea. Back pain. EXAM: MRI LUMBAR SPINE WITHOUT CONTRAST TECHNIQUE: Multiplanar, multisequence MR imaging of the lumbar spine was performed. No intravenous contrast was administered. COMPARISON:  Thoracic MRI today reported separately, CT Chest, Abdomen, and Pelvis and CT lumbar spine 0339 hours today. FINDINGS: Segmentation:  Normal, concordant with the thoracic numbering today. Alignment: Stable from the CT earlier today. Maintained lumbar lordosis. No significant scoliosis or spondylolisthesis. Vertebrae: Background heterogeneous lumbosacral and pelvic marrow signal as seen in the thoracic MRI, compatible with renal osteodystrophy demonstrated by CT. Intermittent mild and degenerative appearing endplate marrow edema from T12 through the lumbosacral junction. Such marrow edema is most pronounced at L5-S1, and there is increased STIR signal in the disc there. But no paraspinal inflammation there. See additional details of that level below. Intact visible sacrum and SI joints. Conus medullaris and cauda equina: Conus extends to the T12-L1 level. No lower spinal cord or conus signal abnormality. Fairly capacious spinal canal  throughout and normal cauda equina nerve roots. Paraspinal and other soft tissues: Stable to the CT Abdomen and Pelvis this morning. No convincing paraspinal soft tissue inflammation. Disc levels: Normal for age above L5-S1. L5-S1: Disc space loss and heterogeneity, some increased central disc STIR signal. No vacuum disc by CT this morning. Mild endplate irregularity by CT there. Circumferential disc osteophyte complex, broad-based posterior component affecting the ventral epidural fat, with mild to moderate lateral recess stenosis greater on the right. No spinal stenosis. Mild to moderate bilateral degenerative foraminal stenosis. IMPRESSION: 1. Background renal osteodystrophy. Intermittent mild degenerative appearing endplate marrow edema as seen in the thoracic spine today. However, more indeterminate changes at the L5-S1 level. Difficult to exclude Early Discitis Osteomyelitis there, although no paraspinal inflammation at this time. Recommend blood cultures, and if negative a repeat Lumbar MRI (without and with contrast preferred, but noncontrast probably would suffice) in 7-14 days. 2. Disc and endplate degeneration at L5-S1 with up to moderate involvement of the exiting L5 and descending S1 nerves. No spinal stenosis. And age-appropriate lumbar spine degeneration otherwise. Electronically Signed   By: Odessa Fleming M.D.   On: 09/07/2022 08:47   MR THORACIC SPINE WO CONTRAST  Result Date: 09/07/2022 CLINICAL DATA:  62 year old male with fever of unknown origin. Dialysis patient. Cough, diarrhea. Back pain. EXAM: MRI THORACIC SPINE WITHOUT CONTRAST TECHNIQUE: Multiplanar, multisequence MR imaging of the thoracic spine was performed. No intravenous contrast was administered. COMPARISON:  CT Chest, Abdomen, and Pelvis 0339 hours today. FINDINGS: Limited cervical spine imaging: Some disc and endplate degeneration. Mildly heterogeneous bone marrow signal similar to that in the thoracic spine. Thoracic spine  segmentation:  Appears to be normal. Alignment: Mildly exaggerated thoracic kyphosis. No spondylolisthesis or significant scoliosis. Vertebrae: Generalized heterogeneous marrow signal, likely corresponding to CT evidence of diffuse renal osteodystrophy earlier today. Mostly vague decreased T1 and T2 marrow signal. Superimposed degenerative appearing endplate changes in the thoracic spine T5 through T10 with multilevel mild focal endplate marrow edema there. No suspicious marrow edema or suspicious marrow lesion. Grossly negative visible posterior ribs. Cord: Negative. Capacious thoracic spinal canal. Conus medullaris appears normal at T12-L1. Paraspinal and other soft tissues: Stable from CT Chest, Abdomen, and Pelvis today. Negative thoracic paraspinal soft tissues. Disc levels: Widespread thoracic disc and endplate degeneration, but no significant thoracic disc herniation. No thoracic spinal stenosis. And only occasional thoracic neural foraminal stenosis (T10-T11 mild-to-moderate T10 foraminal stenosis in part due to facet hypertrophy. IMPRESSION: Background marrow signal changes likely relating to diffuse renal osteodystrophy seen by CT today. Superimposed thoracic disc and endplate degeneration, including multilevel mild degenerative appearing endplate marrow edema in the thoracic spine. But no findings suspicious for discitis osteomyelitis at this time. No thoracic spinal stenosis. Electronically Signed   By: Odessa Fleming M.D.   On: 09/07/2022 08:39   CT L-SPINE NO CHARGE  Result Date: 09/07/2022 CLINICAL DATA:  Fever unknown origin.  Back pain. EXAM: CT LUMBAR SPINE WITHOUT CONTRAST TECHNIQUE: Multidetector CT imaging of the lumbar spine was performed without intravenous contrast administration. Multiplanar CT image reconstructions were also generated. RADIATION DOSE REDUCTION: This exam was performed according to the departmental dose-optimization program which includes automated exposure control, adjustment  of the mA and/or kV according to patient size and/or use of iterative reconstruction technique. COMPARISON:  CT abdomen and pelvis with IV contrast 03/09/2019 FINDINGS: Segmentation: 5 lumbar type vertebrae. Alignment: Within normal limits. Vertebrae: There have been no significant interval changes. The bones are again noted dense consistent with renal osteodystrophy. There are prominent lucencies in the superior and inferior endplates of L2 which were noted previously and could be large Schmorl's nodes or changes of hyperparathyroidism. There are additional slight central endplate depressions L4 and 5 which were noted previously, with discogenic endplate irregularity opposite of L5-S1. No fracture or other acute abnormality is seen. No destructive or aggressive bone lesion. Paraspinal and other soft tissues: Aortoiliac atherosclerosis. Atrophic native kidneys. No acute findings. Disc levels: There are normal disc heights except for moderate disc space loss once again T11-12 and T12-L1. There are no herniated discs or significant disc bulges from T12-L1 through L2-3. At L3-4, there is mild diffuse nonstenosing annular bulge which was seen previously. There is mild foraminal narrowing due to inferior foraminal disc bulging. No herniation or significant canal encroachment. At L4-5, there is a mild diffuse annular bulge without herniation or canal zone stenosis. The foramina are clear. There is only slight facet spurring. At L5-S1, there is a broad-based posterior disc bulge abutting both S1 nerve roots without displacement or compression, herniation or stenosis. Due to inferior foraminal disc bulging and facet spurring there is moderate bilateral foraminal stenosis. The SI joints are patent with mild spurring. No erosive arthropathy is seen. IMPRESSION: 1. No acute osseous abnormalities.  No appreciable interval changes. 2. Lumbar degenerative changes and findings of renal osteodystrophy. 3. Prominent lucencies in the  superior and inferior endplates of L2 which could be large Schmorl's nodes or changes of hyperparathyroidism. Similar findings in 2020. 4. Aortic atherosclerosis. 5. Atrophic native kidneys. Aortic Atherosclerosis (ICD10-I70.0). Electronically Signed   By: Almira Bar M.D.   On: 09/07/2022 04:47   CT CHEST  ABDOMEN PELVIS WO CONTRAST  Result Date: 09/07/2022 CLINICAL DATA:  Fever of unknown origin. EXAM: CT CHEST, ABDOMEN AND PELVIS WITHOUT CONTRAST TECHNIQUE: Multidetector CT imaging of the chest, abdomen and pelvis was performed following the standard protocol without IV contrast. RADIATION DOSE REDUCTION: This exam was performed according to the departmental dose-optimization program which includes automated exposure control, adjustment of the mA and/or kV according to patient size and/or use of iterative reconstruction technique. COMPARISON:  Portable chest today, PA Lat chest 12/29/2017. Chest CT with contrast 12/25/2017, abdomen and pelvis CT with contrast 03/09/2019, abdomen and pelvis CT no contrast 07/07/2017. FINDINGS: CT CHEST FINDINGS Cardiovascular: The cardiac size is normal, was previously enlarged on all prior CTs. The pulmonary trunk remains prominent at 3.3 cm indicating arterial hypertension. No venous distention is seen. There is a dialysis catheter via right IJ approach, the tip in the upper right atrium. There are moderate calcifications of the aortic valve leaflets, mild-to-moderate calcifications in the thoracic aorta without aneurysm. Scattered calcific plaque in the great vessels with normal great vessel branching. Mediastinum/Nodes: Numerous tiny lymph nodes are scattered throughout the mediastinum. These are less prominent than in 2019. Largest is 1.1 cm in short axis in the left prevascular space and similar to the prior study. Others are not enlarged and most of them are less than 5 mm. There is no esophageal thickening, tracheal or main bronchus filling defect. Thyroid gland and  axillary spaces are unremarkable. Lungs/Pleura: Lung bases show scattered subsegmental level. There is new demonstration of a posterior basal left lower lobe 6 mm noncalcified nodule on 6:110. There is new demonstration of a 3 mm noncalcified right upper lobe nodule on 6:47. The remainder of the bilateral lungs are clear. No focal pneumonia is evident. Musculoskeletal: Bilateral subareolar gynecomastia appears similar. There are multilevel endplate Schmorl's nodes. Mild thoracic kyphosis. There is thoracic spondylosis. No aggressive osseous lesion. CT ABDOMEN PELVIS FINDINGS Hepatobiliary: The liver is 18.6 cm length and mildly steatotic. No mass is seen without contrast. The gallbladder and bile ducts are unremarkable. Pancreas: Unremarkable without contrast. Spleen: Unremarkable without contrast. Adrenals/Urinary Tract: There is no adrenal mass. The native kidneys are atrophic. There are multiple small cysts and additional too small to characterize subcentimeter hypodensities. No specific follow-up recommendation is made. There are no intrarenal stones in the native kidneys no hydronephrosis. 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 Findings consistent with chronic transplant rejection. Ureteral stent again extends from this transplant into the bladder. There is diffuse irregular bladder thickening which was seen on prior studies as well with perivesical stranding. Correlate clinically for chronic cystitis. Stomach/Bowel: The stomach, duodenum and proximal jejunum are unremarkable but there are dilated segments of distal jejunum and ileum with operative at adjuvant up to 3 cm, or decompression of the remainder. A transitional segment could not be found. Findings are concerning for a low-grade approximately mid ileal small-bowel obstruction potential due to occult adhesions or occult internal hernia. The  appendix is normal caliber. There is diffuse colonic diverticulosis without evidence of diverticulitis. Vascular/Lymphatic: Aortic atherosclerosis. No enlarged abdominal or pelvic lymph nodes. Reproductive: Borderline prostate size 4.4 cm transverse. Both testicles are in the scrotal sac. Other: Small umbilical and left inguinal fat hernias. No incarcerated hernia. There is no free fluid, free hemorrhage, or free air. Musculoskeletal: Dense bones consistent with renal osteodystrophy. Lucencies in the L2 vertebral body are stable since 2020 consistent with prominent Schmorl's  nodes or changes of hyperparathyroidism. There is no aggressive bone lesion regional skeletal fracture. IMPRESSION: 1. No focal pneumonia is seen. 2. 6 mm left lower lobe and 3 mm right upper lobe noncalcified nodules. Per Fleischner Society Guidelines, recommend a non-contrast Chest CT at 3-6 months, then consider another non-contrast Chest CT at 18-24 months. If patient is low risk for malignancy, non-contrast Chest CT at 18-24 months is optional. These guidelines do not apply to immunocompromised patients and patients with cancer. Follow up in patients with significant comorbidities as clinically warranted. For lung cancer screening, adhere to Lung-RADS guidelines. Reference: Radiology. 2017; 284(1):228-43. 3. Aortic and coronary artery atherosclerosis. 4. Aortic valve leaflet calcifications. Echocardiography may be helpful to assess for valvular dysfunction. 5. Chronic prominence of the pulmonary trunk indicating arterial hypertension. 6. Numerous tiny lymph nodes in the mediastinum, less prominent than in 2019. 7. Atrophic native kidneys with increasingly calcified and atrophic right iliac fossa renal transplant. Ureteral stent again extends from this transplant into the bladder. 8. Mildly dilated segments of distal jejunum and ileum with decompression of the remainder of the small bowel. Findings concerning for a low-grade approximately  mid ileal small-bowel obstruction potentially due to occult adhesions or occult internal hernia. 9. Diffuse colonic diverticulosis without evidence of diverticulitis. 10. Likely cystitis. No other findings to explain the patient's fever. 11. Renal osteodystrophy. 12. Umbilical and left inguinal fat hernias. Aortic Atherosclerosis (ICD10-I70.0). Electronically Signed   By: Almira Bar M.D.   On: 09/07/2022 04:33   DG Chest Portable 1 View  Result Date: 09/07/2022 CLINICAL DATA:  Cough and fevers EXAM: PORTABLE CHEST 1 VIEW COMPARISON:  12/29/2017 FINDINGS: Cardiac shadow is enlarged but stable. Dialysis catheter is noted in satisfactory position. Lungs are well aerated bilaterally. No focal infiltrate or effusion is seen. No bony abnormality is noted. IMPRESSION: No acute abnormality seen. Electronically Signed   By: Alcide Clever M.D.   On: 09/07/2022 02:42    Dialysis Orders:  [facility] TTS 3:45 450/A1.5x 2K/2Ca EDW 85.7kg TDC  -Heparin 3000 U  -No ESA -Calcitriol 1.5    Assessment/Plan: MSSA bacteremia - 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 ID following. Repeat blood cultures ordered. On cefazolin. Plan for cathter holiday after dialysis Tuesday. 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 HD tomorrow -1st shift -- then line holiday.  Access -  Had L AVF ligation in 2019. Last seen by Dr. Edilia Bo 01/2022 -limited mobility in R arm so planning for L arm graft but had not scheduled surgery yet.   Hypertension/volume  - BP/volume acceptable.  Anemia  - Hb at goal. No ESA needs currently.  Metabolic bone disease -  Ca acceptable. Continue home binders/calcitriol  Nutrition - Renal diet with fluid restriction.   Tomasa Blase PA-C [facility] 09/08/2022, 1:43 PM

## 2022-09-08 NOTE — Progress Notes (Signed)
Progress Note   Patient: Ryan [name] UJW:[id number] DOB: 02-26-1961 DOA: 09/07/2022     1 DOS: the patient was seen and examined on 09/08/2022   Brief hospital course: 62yo male with h/o HTN, HLD, and ESRD on HD who presented to [facility] on 6/16 with low back pain.  He was febrile with O2 sats 90%.  CT L-spine with lung nodules and dilated small bowel loops.  WBC 23,800, troponin 92.  Blood cultures pending, treated with Vanc/Cefepime.  MRI T-spine and L-spine ordered.  Patient was transferred to [facility].  Surgery consulted, no surgical concerns, signed off.  MRI with indeterminate changes at L5-S1, possible early discitis/osteomyelitis.  Recommended to have blood cultures (pending) and if negative a repeat lumbar MRI in 7-14 days.  Blood cultures positive for MSSA.  Assessment and Plan:  L5-S1 discitis/osteomyelitis with MSSA bacteremia -Patient presenting with back pain -MRI showed indeterminate changes at L5-S1, possible early discitis/osteomyelitis -Will need antibiotics for 4-6 weeks - currently on Cefepime/Vanc. -With vertebral osteo, needs testing for TB; negative quantiferon gold and HIV testing in 04/2022 -Further important considerations include nutrition (will order consult), diabetes control (A1c 5.8 in 04/2022, prediabetes) -ID is consulting -Bacteremia is likely due to line infection, probably needs TDC exchange -Needs recurrent blood cultures for surveillance until negative -Echo performed and shows probable vegetation; will defer to ID about whether TEE is needed  ESRD on HD -Patient on chronic TTS HD -Nephrology prn order set utilized -Nephrology is aware that patient will need HD  -Given concern for infected line, he is likely to need line exchange -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  Also wants something for indigestion.   Physical Exam: Vitals:   09/07/22 2015 09/08/22 0628 09/08/22 0832 09/08/22 1620  BP: 124/71 (!) 149/91 (!) 145/84 127/78  Pulse: 76 73 78 71  Resp: 19  18 18   Temp: 98.5 F (36.9 C)  98.1 F (36.7 C) 97.7 F (36.5 C)  TempSrc: Oral Oral Oral Oral  SpO2: 93% 96% 93% 92%  Weight:      Height:       General:  Appears calm and comfortable and is in NAD, sitting up in bedside chair Eyes:   EOMI, normal lids, iris ENT:  grossly normal hearing, lips & tongue, mmm Neck:  no LAD, masses or thyromegaly Cardiovascular:  RRR, +2-3/6 systolic murmur, no r/g. No LE edema.  Respiratory:   CTA bilaterally with no wheezes/rales/rhonchi.  Normal respiratory effort. Abdomen:  soft, NT, ND Back:   normal alignment, no deformity, point TTP along LS spine Skin:  no rash or induration seen on limited exam Musculoskeletal:  grossly normal tone BUE/BLE, good ROM, no bony abnormality Psychiatric:  grossly normal mood and affect, speech fluent and appropriate, AOx3 Neurologic:  CN 2-12 grossly intact, moves all extremities in coordinated fashion   Radiological Exams on Admission: Independently reviewed - see discussion in A/P where applicable  ECHOCARDIOGRAM COMPLETE BUBBLE STUDY  Result Date: 09/08/2022    ECHOCARDIOGRAM REPORT   Patient Name:   YUVAAN KAUK [facility]  Date of Exam: 09/08/2022 Medical Rec #:  [id number]           Height:       69.0 in Accession #:    [id number]          Weight:       185.0 lb Date of Birth:  1961-02-18            BSA:          1.999 m Patient Age:    62 years            BP:           145/84 mmHg Patient Gender: M                   HR:           74 bpm.  Exam Location:  Inpatient Procedure: 2D Echo, Cardiac Doppler, Color Doppler and Saline Contrast Bubble            Study Indications:    I50.32 Chronic diastolic heart failure  History:        Patient has prior history of Echocardiogram examinations, most                 recent 08/06/2022. Risk Factors:Hypertension and Dyslipidemia.  Sonographer:    Dondra Prader RVT RCS Referring Phys: [id number] [facility]  Sonographer Comments: Technically difficult study due to poor echo windows. Patient had trouble positioning for exam and having back spasms. IMPRESSIONS  1. Left ventricular ejection fraction, by estimation, is 55 to 60%. The left ventricle has normal function. The left ventricle has no regional wall motion abnormalities. There is moderate concentric left ventricular hypertrophy. Left ventricular diastolic parameters are consistent with Grade I diastolic dysfunction (impaired relaxation).  2. Right ventricular systolic function is normal. The right ventricular size is normal.  3. Left atrial size was mildly dilated.  4. Bubble study performed per tech's notes but I do not see any bubbles appear on the study. Consider repeat study, if clinically indicated (can do during TEE if needed).  5. There is a large, rounded structure on the anterior leflet of the mitral valve tht is partially obstructing mitral inflow with a ball-valve type motion. This was not present on echo in 5/24. Concern for endocarditis. Suggest TEE to more formally evalaute. The mitral valve is normal in structure. No evidence of mitral valve regurgitation. No evidence of mitral stenosis.  6. The right coronary cusp of the aortic valve appears fused. The aortic valve is normal in structure. There is moderate calcification of the aortic valve. Aortic valve regurgitation is mild. Mild aortic valve stenosis. Aortic regurgitation PHT measures  526 msec. Aortic valve area, by VTI measures 1.90 cm. Aortic valve mean gradient measures 10.0 mmHg. Aortic  valve Vmax measures 2.17 m/s.  7. Aortic dilatation noted. There is mild dilatation of the ascending aorta, measuring 39 mm.  8. The inferior vena cava is normal in size with greater than 50% respiratory variability, suggesting right atrial pressure of 3 mmHg. Conclusion(s)/Recommendation(s): There is a large, rounded structure on the anterior leflet of the mitral valve tht is partially obstructing mitral inflow with a ball-valve type motion. This was not present on echo in 5/24. Concern for endocarditis. Suggest TEE to more formally evalaute. FINDINGS  Left Ventricle: Left ventricular ejection fraction, by estimation, is 55 to 60%. The left ventricle has normal function. The left ventricle has no regional wall motion abnormalities. The left ventricular internal cavity  size was normal in size. There is  moderate concentric left ventricular hypertrophy. Left ventricular diastolic parameters are consistent with Grade I diastolic dysfunction (impaired relaxation). Right Ventricle: The right ventricular size is normal. No increase in right ventricular wall thickness. Right ventricular systolic function is normal. Left Atrium: Left atrial size was mildly dilated. Right Atrium: Right atrial size was normal in size. Pericardium: There is no evidence of pericardial effusion. Mitral Valve: There is a large, rounded structure on the anterior leflet of the mitral valve tht is partially obstructing mitral inflow with a ball-valve type motion. This was not present on echo in 5/24. Concern for endocarditis. Suggest TEE to more formally evalaute. The mitral valve is normal in structure. No evidence of mitral valve regurgitation. No evidence of mitral valve stenosis. Tricuspid Valve: The tricuspid valve is normal in structure. Tricuspid valve regurgitation is not demonstrated. No evidence of tricuspid stenosis. Aortic Valve: The right coronary cusp of the aortic valve appears fused. The aortic valve is normal in structure. There is  moderate calcification of the aortic valve. Aortic valve regurgitation is mild. Aortic regurgitation PHT measures 526 msec. Mild aortic stenosis is present. Aortic valve mean gradient measures 10.0 mmHg. Aortic valve peak gradient measures 18.9 mmHg. Aortic valve area, by VTI measures 1.90 cm. Pulmonic Valve: The pulmonic valve was normal in structure. Pulmonic valve regurgitation is trivial. No evidence of pulmonic stenosis. Aorta: Aortic dilatation noted. There is mild dilatation of the ascending aorta, measuring 39 mm. Venous: The inferior vena cava is normal in size with greater than 50% respiratory variability, suggesting right atrial pressure of 3 mmHg. IAS/Shunts: No atrial level shunt detected by color flow Doppler. Agitated saline contrast was given intravenously to evaluate for intracardiac shunting.  LEFT VENTRICLE PLAX 2D LVIDd:         4.60 cm   Diastology LVIDs:         3.00 cm   LV e' medial:    5.61 cm/s LV PW:         1.20 cm   LV E/e' medial:  21.0 LV IVS:        1.40 cm   LV e' lateral:   6.66 cm/s LVOT diam:     1.80 cm   LV E/e' lateral: 17.7 LV SV:         78 LV SV Index:   39 LVOT Area:     2.54 cm  RIGHT VENTRICLE            IVC RV S prime:     9.64 cm/s  IVC diam: 1.10 cm TAPSE (M-mode): 2.0 cm LEFT ATRIUM             Index LA diam:        3.60 cm 1.80 cm/m LA Vol (A2C):   70.2 ml 35.12 ml/m LA Vol (A4C):   49.4 ml 24.69 ml/m LA Biplane Vol: 61.6 ml 30.82 ml/m  AORTIC VALVE                     PULMONIC VALVE AV Area (Vmax):    1.88 cm      PV Vmax:       0.88 m/s AV Area (Vmean):   1.89 cm      PV Peak grad:  3.1 mmHg AV Area (VTI):     1.90 cm AV Vmax:           217.50 cm/s AV Vmean:  147.000 cm/s AV VTI:            0.411 m AV Peak Grad:      18.9 mmHg AV Mean Grad:      10.0 mmHg LVOT Vmax:         161.00 cm/s LVOT Vmean:        109.000 cm/s LVOT VTI:          0.307 m LVOT/AV VTI ratio: 0.75 AI PHT:            526 msec  AORTA Ao Root diam: 3.70 cm Ao Asc diam:  3.90 cm  MITRAL VALVE MV Area (PHT): 3.08 cm     SHUNTS MV Decel Time: 246 msec     Systemic VTI:  0.31 m MV E velocity: 118.00 cm/s  Systemic Diam: 1.80 cm MV A velocity: 126.00 cm/s MV E/A ratio:  0.94 Arvilla Meres MD Electronically signed by Arvilla Meres MD Signature Date/Time: 09/08/2022/4:37:49 PM    Final    MR LUMBAR SPINE WO CONTRAST  Result Date: 09/07/2022 CLINICAL DATA:  62 year old male with fever of unknown origin. Dialysis patient. Cough, diarrhea. Back pain. EXAM: MRI LUMBAR SPINE WITHOUT CONTRAST TECHNIQUE: Multiplanar, multisequence MR imaging of the lumbar spine was performed. No intravenous contrast was administered. COMPARISON:  Thoracic MRI today reported separately, CT Chest, Abdomen, and Pelvis and CT lumbar spine 0339 hours today. FINDINGS: Segmentation:  Normal, concordant with the thoracic numbering today. Alignment: Stable from the CT earlier today. Maintained lumbar lordosis. No significant scoliosis or spondylolisthesis. Vertebrae: Background heterogeneous lumbosacral and pelvic marrow signal as seen in the thoracic MRI, compatible with renal osteodystrophy demonstrated by CT. Intermittent mild and degenerative appearing endplate marrow edema from T12 through the lumbosacral junction. Such marrow edema is most pronounced at L5-S1, and there is increased STIR signal in the disc there. But no paraspinal inflammation there. See additional details of that level below. Intact visible sacrum and SI joints. Conus medullaris and cauda equina: Conus extends to the T12-L1 level. No lower spinal cord or conus signal abnormality. Fairly capacious spinal canal throughout and normal cauda equina nerve roots. Paraspinal and other soft tissues: Stable to the CT Abdomen and Pelvis this morning. No convincing paraspinal soft tissue inflammation. Disc levels: Normal for age above L5-S1. L5-S1: Disc space loss and heterogeneity, some increased central disc STIR signal. No vacuum disc by CT this morning.  Mild endplate irregularity by CT there. Circumferential disc osteophyte complex, broad-based posterior component affecting the ventral epidural fat, with mild to moderate lateral recess stenosis greater on the right. No spinal stenosis. Mild to moderate bilateral degenerative foraminal stenosis. IMPRESSION: 1. Background renal osteodystrophy. Intermittent mild degenerative appearing endplate marrow edema as seen in the thoracic spine today. However, more indeterminate changes at the L5-S1 level. Difficult to exclude Early Discitis Osteomyelitis there, although no paraspinal inflammation at this time. Recommend blood cultures, and if negative a repeat Lumbar MRI (without and with contrast preferred, but noncontrast probably would suffice) in 7-14 days. 2. Disc and endplate degeneration at L5-S1 with up to moderate involvement of the exiting L5 and descending S1 nerves. No spinal stenosis. And age-appropriate lumbar spine degeneration otherwise. Electronically Signed   By: Odessa Fleming M.D.   On: 09/07/2022 08:47   MR THORACIC SPINE WO CONTRAST  Result Date: 09/07/2022 CLINICAL DATA:  62 year old male with fever of unknown origin. Dialysis patient. Cough, diarrhea. Back pain. EXAM: MRI THORACIC SPINE WITHOUT CONTRAST TECHNIQUE: Multiplanar, multisequence MR imaging of the thoracic spine was  performed. No intravenous contrast was administered. COMPARISON:  CT Chest, Abdomen, and Pelvis 0339 hours today. FINDINGS: Limited cervical spine imaging: Some disc and endplate degeneration. Mildly heterogeneous bone marrow signal similar to that in the thoracic spine. Thoracic spine segmentation:  Appears to be normal. Alignment: Mildly exaggerated thoracic kyphosis. No spondylolisthesis or significant scoliosis. Vertebrae: Generalized heterogeneous marrow signal, likely corresponding to CT evidence of diffuse renal osteodystrophy earlier today. Mostly vague decreased T1 and T2 marrow signal. Superimposed degenerative appearing  endplate changes in the thoracic spine T5 through T10 with multilevel mild focal endplate marrow edema there. No suspicious marrow edema or suspicious marrow lesion. Grossly negative visible posterior ribs. Cord: Negative. Capacious thoracic spinal canal. Conus medullaris appears normal at T12-L1. Paraspinal and other soft tissues: Stable from CT Chest, Abdomen, and Pelvis today. Negative thoracic paraspinal soft tissues. Disc levels: Widespread thoracic disc and endplate degeneration, but no significant thoracic disc herniation. No thoracic spinal stenosis. And only occasional thoracic neural foraminal stenosis (T10-T11 mild-to-moderate T10 foraminal stenosis in part due to facet hypertrophy. IMPRESSION: Background marrow signal changes likely relating to diffuse renal osteodystrophy seen by CT today. Superimposed thoracic disc and endplate degeneration, including multilevel mild degenerative appearing endplate marrow edema in the thoracic spine. But no findings suspicious for discitis osteomyelitis at this time. No thoracic spinal stenosis. Electronically Signed   By: Odessa Fleming M.D.   On: 09/07/2022 08:39   CT L-SPINE NO CHARGE  Result Date: 09/07/2022 CLINICAL DATA:  Fever unknown origin.  Back pain. EXAM: CT LUMBAR SPINE WITHOUT CONTRAST TECHNIQUE: Multidetector CT imaging of the lumbar spine was performed without intravenous contrast administration. Multiplanar CT image reconstructions were also generated. RADIATION DOSE REDUCTION: This exam was performed according to the departmental dose-optimization program which includes automated exposure control, adjustment of the mA and/or kV according to patient size and/or use of iterative reconstruction technique. COMPARISON:  CT abdomen and pelvis with IV contrast 03/09/2019 FINDINGS: Segmentation: 5 lumbar type vertebrae. Alignment: Within normal limits. Vertebrae: There have been no significant interval changes. The bones are again noted dense consistent with  renal osteodystrophy. There are prominent lucencies in the superior and inferior endplates of L2 which were noted previously and could be large Schmorl's nodes or changes of hyperparathyroidism. There are additional slight central endplate depressions L4 and 5 which were noted previously, with discogenic endplate irregularity opposite of L5-S1. No fracture or other acute abnormality is seen. No destructive or aggressive bone lesion. Paraspinal and other soft tissues: Aortoiliac atherosclerosis. Atrophic native kidneys. No acute findings. Disc levels: There are normal disc heights except for moderate disc space loss once again T11-12 and T12-L1. There are no herniated discs or significant disc bulges from T12-L1 through L2-3. At L3-4, there is mild diffuse nonstenosing annular bulge which was seen previously. There is mild foraminal narrowing due to inferior foraminal disc bulging. No herniation or significant canal encroachment. At L4-5, there is a mild diffuse annular bulge without herniation or canal zone stenosis. The foramina are clear. There is only slight facet spurring. At L5-S1, there is a broad-based posterior disc bulge abutting both S1 nerve roots without displacement or compression, herniation or stenosis. Due to inferior foraminal disc bulging and facet spurring there is moderate bilateral foraminal stenosis. The SI joints are patent with mild spurring. No erosive arthropathy is seen. IMPRESSION: 1. No acute osseous abnormalities.  No appreciable interval changes. 2. Lumbar degenerative changes and findings of renal osteodystrophy. 3. Prominent lucencies in the superior and inferior endplates of L2  which could be large Schmorl's nodes or changes of hyperparathyroidism. Similar findings in 2020. 4. Aortic atherosclerosis. 5. Atrophic native kidneys. Aortic Atherosclerosis (ICD10-I70.0). Electronically Signed   By: Almira Bar M.D.   On: 09/07/2022 04:47   CT CHEST ABDOMEN PELVIS WO  CONTRAST  Result Date: 09/07/2022 CLINICAL DATA:  Fever of unknown origin. EXAM: CT CHEST, ABDOMEN AND PELVIS WITHOUT CONTRAST TECHNIQUE: Multidetector CT imaging of the chest, abdomen and pelvis was performed following the standard protocol without IV contrast. RADIATION DOSE REDUCTION: This exam was performed according to the departmental dose-optimization program which includes automated exposure control, adjustment of the mA and/or kV according to patient size and/or use of iterative reconstruction technique. COMPARISON:  Portable chest today, PA Lat chest 12/29/2017. Chest CT with contrast 12/25/2017, abdomen and pelvis CT with contrast 03/09/2019, abdomen and pelvis CT no contrast 07/07/2017. FINDINGS: CT CHEST FINDINGS Cardiovascular: The cardiac size is normal, was previously enlarged on all prior CTs. The pulmonary trunk remains prominent at 3.3 cm indicating arterial hypertension. No venous distention is seen. There is a dialysis catheter via right IJ approach, the tip in the upper right atrium. There are moderate calcifications of the aortic valve leaflets, mild-to-moderate calcifications in the thoracic aorta without aneurysm. Scattered calcific plaque in the great vessels with normal great vessel branching. Mediastinum/Nodes: Numerous tiny lymph nodes are scattered throughout the mediastinum. These are less prominent than in 2019. Largest is 1.1 cm in short axis in the left prevascular space and similar to the prior study. Others are not enlarged and most of them are less than 5 mm. There is no esophageal thickening, tracheal or main bronchus filling defect. Thyroid gland and axillary spaces are unremarkable. Lungs/Pleura: Lung bases show scattered subsegmental level. There is new demonstration of a posterior basal left lower lobe 6 mm noncalcified nodule on 6:110. There is new demonstration of a 3 mm noncalcified right upper lobe nodule on 6:47. The remainder of the bilateral lungs are clear. No  focal pneumonia is evident. Musculoskeletal: Bilateral subareolar gynecomastia appears similar. There are multilevel endplate Schmorl's nodes. Mild thoracic kyphosis. There is thoracic spondylosis. No aggressive osseous lesion. CT ABDOMEN PELVIS FINDINGS Hepatobiliary: The liver is 18.6 cm length and mildly steatotic. No mass is seen without contrast. The gallbladder and bile ducts are unremarkable. Pancreas: Unremarkable without contrast. Spleen: Unremarkable without contrast. Adrenals/Urinary Tract: There is no adrenal mass. The native kidneys are atrophic. There are multiple small cysts and additional too small to characterize subcentimeter hypodensities. No specific follow-up recommendation is made. There are no intrarenal stones in the native kidneys no hydronephrosis. 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 Findings consistent with chronic transplant rejection. Ureteral stent again extends from this transplant into the bladder. There is diffuse irregular bladder thickening which was seen on prior studies as well with perivesical stranding. Correlate clinically for chronic cystitis. Stomach/Bowel: The stomach, duodenum and proximal jejunum are unremarkable but there are dilated segments of distal jejunum and ileum with operative at adjuvant up to 3 cm, or decompression of the remainder. A transitional segment could not be found. Findings are concerning for a low-grade approximately mid ileal small-bowel obstruction potential due to occult adhesions or occult internal hernia. The appendix is normal caliber. There is diffuse colonic diverticulosis without evidence of diverticulitis. Vascular/Lymphatic: Aortic atherosclerosis. No enlarged abdominal or pelvic lymph nodes. Reproductive: Borderline prostate size 4.4 cm transverse. Both testicles are in the scrotal sac.  Other: Small umbilical and left inguinal  fat hernias. No incarcerated hernia. There is no free fluid, free hemorrhage, or free air. Musculoskeletal: Dense bones consistent with renal osteodystrophy. Lucencies in the L2 vertebral body are stable since 2020 consistent with prominent Schmorl's nodes or changes of hyperparathyroidism. There is no aggressive bone lesion regional skeletal fracture. IMPRESSION: 1. No focal pneumonia is seen. 2. 6 mm left lower lobe and 3 mm right upper lobe noncalcified nodules. Per Fleischner Society Guidelines, recommend a non-contrast Chest CT at 3-6 months, then consider another non-contrast Chest CT at 18-24 months. If patient is low risk for malignancy, non-contrast Chest CT at 18-24 months is optional. These guidelines do not apply to immunocompromised patients and patients with cancer. Follow up in patients with significant comorbidities as clinically warranted. For lung cancer screening, adhere to Lung-RADS guidelines. Reference: Radiology. 2017; 284(1):228-43. 3. Aortic and coronary artery atherosclerosis. 4. Aortic valve leaflet calcifications. Echocardiography may be helpful to assess for valvular dysfunction. 5. Chronic prominence of the pulmonary trunk indicating arterial hypertension. 6. Numerous tiny lymph nodes in the mediastinum, less prominent than in 2019. 7. Atrophic native kidneys with increasingly calcified and atrophic right iliac fossa renal transplant. Ureteral stent again extends from this transplant into the bladder. 8. Mildly dilated segments of distal jejunum and ileum with decompression of the remainder of the small bowel. Findings concerning for a low-grade approximately mid ileal small-bowel obstruction potentially due to occult adhesions or occult internal hernia. 9. Diffuse colonic diverticulosis without evidence of diverticulitis. 10. Likely cystitis. No other findings to explain the patient's fever. 11. Renal osteodystrophy. 12. Umbilical and left inguinal fat hernias. Aortic Atherosclerosis  (ICD10-I70.0). Electronically Signed   By: Almira Bar M.D.   On: 09/07/2022 04:33   DG Chest Portable 1 View  Result Date: 09/07/2022 CLINICAL DATA:  Cough and fevers EXAM: PORTABLE CHEST 1 VIEW COMPARISON:  12/29/2017 FINDINGS: Cardiac shadow is enlarged but stable. Dialysis catheter is noted in satisfactory position. Lungs are well aerated bilaterally. No focal infiltrate or effusion is seen. No bony abnormality is noted. IMPRESSION: No acute abnormality seen. Electronically Signed   By: Alcide Clever M.D.   On: 09/07/2022 02:42    EKG: Independently reviewed.  NSR with rate 99; nonspecific ST changes with no evidence of acute ischemia   Pertinent labs:    Glucose 142 BUN 43/Creatinine 8.42/GFR 7 Anion gap 16 Albumin 3.2 BNP 433.4 HS troponin 92, 111, 170 Lactate 1.3 WBC 23.9 Hgb 11.1 CRP 44 ESR 105 COVID negative Hep B SAg negative Blood cultures positive for MSSA from 6/16, sensitivities to follow Surveillance blood cultures from 6/17 pending x 2    Family Communication: None present; I spoke with his daughter by telephone while I was in the room  Disposition: Status is: Inpatient Remains inpatient appropriate because: ongoing evaluation/treatment  Planned Discharge Destination:  to be determined    Time spent: 50 minutes  Author: Jonah Blue, MD 09/08/2022 6:37 PM  For on call review [email or web address].

## 2022-09-08 NOTE — Consult Note (Signed)
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  MRI showed possible L5-S1 early osteomyelitis. - Patient was febrile with leukocytosis 22 K on arrival.  Respiratory viral panel negative.  He had no respiratory complaints this a.m.  Found to have MSSA bacteremia as well. - No wounds noted.  He does have a right IJ which she notes he has had less than a year.  I suspect this is the source of bacteremia. - Will get additional workup but anticipate prolonged antibiotics given possible osteomyelitis/discitis. Recommendations:  -Continue cefazolin -Repeat blood Cx -TTE -Line needs to come out(RIJ) as it isi likely the port of entry for MSSA Microbiology:   Antibiotics: Vancomycin6/15- Cefepime 6/15 Metro 6/*15 Cefazolin Cultures: Blood 6/16 2/2 MSSA Urine  Other   HPI: Ryan [name] is a 62 y.o. male with past medical history of hypertension, hyperlipidemia, ESRD presented to the ED with acute low back pain.  On arrival to the ED patient had temp of 102.5, WBC 23 K.  He had been experiencing rhinorrhea, sore throat for about 3 days prior to admission.  Last HD session was day before admission.MRI T and L-spine showed indeterminate changes L5-S1 difficult to exclude early discitis/osteomyelitis.  Found to have MSSA bacteremia.  ID auto consulted.  Patient states that he is unsure how long he has had his  IJ.  He does report that it has been less than a year.  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  Pro-Cal negative.   Review of Systems: ROS  Past Medical History:  Diagnosis Date   Allergy    Anemia    Blood transfusion without reported diagnosis    Dialysis patient (HCC)    Tues,thurs,sat   ESRD (end stage renal disease) (HCC)    TTHS-    Family history of adverse reaction to anesthesia    father had allergic reaction with anesthesia with a dental procedure-(pt. doesn't know)   Hyperlipidemia    Hypertension     Social History   Tobacco Use   Smoking status: Never   Smokeless tobacco: Never  Vaping Use   Vaping Use: Never used  Substance Use Topics   Alcohol use: No   Drug use: No    Family History  Problem Relation Age of Onset   Hypertension Mother    Heart disease Mother 48       By his report, he thinks that she had heart attack.   Stroke Mother    Cancer Father    Kidney cancer Father    Hypertension Sister    Heart disease Maternal Grandmother    Alcohol abuse Maternal Grandfather    Mental illness Paternal Grandmother    Learning disabilities Paternal Grandmother        Alzheimer's    Stroke Paternal Grandfather    Colon cancer  Neg Hx    Colon polyps Neg Hx    Esophageal cancer Neg Hx    Rectal cancer Neg Hx    Stomach cancer Neg Hx    Scheduled Meds:  Chlorhexidine Gluconate Cloth  6 each Topical Daily   sodium chloride flush  3 mL Intravenous Q12H   Continuous Infusions:  [START ON 09/09/2022]  ceFAZolin (ANCEF) IV     PRN Meds:.acetaminophen **OR** acetaminophen, HYDROmorphone (DILAUDID) injection, lip balm, ondansetron **OR** ondansetron (ZOFRAN) IV, oxyCODONE, senna Allergies  Allergen Reactions   Azithromycin Nausea And Vomiting and Other (See Comments)    Chest tightness  Other Reaction(s): GI Intolerance   Iodinated Contrast Media Itching and Nausea Only    Probably needs prednisone prep prior to contrast     OBJECTIVE: Blood pressure (!) 145/84, pulse 78, temperature 98.1 F (36.7 C), temperature source Oral, resp. rate 18, height 5\' 9"  (1.753 m), weight 83.9 kg, SpO2 93 %.  Physical Exam Constitutional:      General: He is not in acute distress.    Appearance: He is normal weight. He is not toxic-appearing.  HENT:     Head: Normocephalic and atraumatic.     Right Ear: External ear normal.     Left Ear: External ear normal.     Nose: No congestion or rhinorrhea.     Mouth/Throat:     Mouth: Mucous membranes are moist.     Pharynx: Oropharynx is clear.  Eyes:     Extraocular Movements: Extraocular movements intact.     Conjunctiva/sclera: Conjunctivae normal.     Pupils: Pupils are equal, round, and reactive to light.  Cardiovascular:     Rate and Rhythm: Normal rate and regular rhythm.     Heart sounds: No murmur heard.    No friction rub. No gallop.     Comments: RIJ Pulmonary:     Effort: Pulmonary effort is normal.     Breath sounds: Normal breath sounds.  Abdominal:     General: Abdomen is flat. Bowel sounds are normal.     Palpations: Abdomen is soft.  Musculoskeletal:        General: No swelling. Normal range of motion.     Cervical back: Normal range of motion and neck supple.  Skin:    General: Skin is warm and dry.  Neurological:     General: No focal deficit present.     Mental Status: He is oriented to person, place, and time.  Psychiatric:        Mood and Affect: Mood normal.     Lab Results Lab Results  Component Value Date   WBC 23.9 (H) 09/07/2022   HGB 11.1 (L) 09/07/2022   HCT 34.7 (L) 09/07/2022   MCV 87.8 09/07/2022   PLT 154 09/07/2022    Lab Results  Component Value Date   CREATININE 8.42 (H) 09/07/2022   BUN 43 (H) 09/07/2022   NA 135 09/07/2022   K 4.1 09/07/2022   CL 91 (L) 09/07/2022   CO2 28 09/07/2022    Lab Results  Component Value Date   ALT 34 09/07/2022   AST 53 (H) 09/07/2022   ALKPHOS 86 09/07/2022   BILITOT 0.6  09/07/2022       Danelle Earthly, MD [facility] 09/08/2022, 11:42 AM   I have personally spent 88 minutes involved in face-to-face and non-face-to-face activities for this patient on the day of the visit. Professional time spent includes the following  activities: Preparing to see the patient (review of tests), Obtaining and/or reviewing separately obtained history (admission/discharge record), Performing a medically appropriate examination and/or evaluation , Ordering medications/tests/procedures, referring and communicating with other health care professionals, Documenting clinical information in the EMR, Independently interpreting results (not separately reported), Communicating results to the patient/family/caregiver, Counseling and educating the patient/family/caregiver and Care coordination (not separately reported).

## 2022-09-08 NOTE — Plan of Care (Signed)
  Problem: Fluid Volume: Goal: Hemodynamic stability will improve Outcome: Progressing Pt is an HD pt and receives HD on Tues, Thurs and Saturday.  He stated he last had HD on 09/06/2022.  Pt is on a renal diet with a fluid restriction and daily weights per MD's orders.   Problem: Clinical Measurements: Goal: Diagnostic test results will improve Outcome: Progressing Pt had an echocardiogram with bubble study this afternoon.  Results are pending.   Problem: Clinical Measurements: Goal: Signs and symptoms of infection will decrease Outcome: Progressing S/Sx of infection monitored and assessed q-shift.  Pt has remained afebrile thus far.  He in on abx per MD's orders.   Problem: Respiratory: Goal: Ability to maintain adequate ventilation will improve Outcome: Progressing Respiratory status monitored and assessed q-shift. Pt is on room air with O2 saturations at 92-93% and respiration rate of 18 breaths per minute. Pt has not endorsed c/o SOB or DOE.

## 2022-09-09 ENCOUNTER — Inpatient Hospital Stay (HOSPITAL_COMMUNITY): Payer: No Typology Code available for payment source

## 2022-09-09 DIAGNOSIS — M4647 Discitis, unspecified, lumbosacral region: Secondary | ICD-10-CM | POA: Diagnosis not present

## 2022-09-09 DIAGNOSIS — B9561 Methicillin susceptible Staphylococcus aureus infection as the cause of diseases classified elsewhere: Secondary | ICD-10-CM | POA: Diagnosis not present

## 2022-09-09 DIAGNOSIS — J3489 Other specified disorders of nose and nasal sinuses: Secondary | ICD-10-CM | POA: Diagnosis not present

## 2022-09-09 DIAGNOSIS — D631 Anemia in chronic kidney disease: Secondary | ICD-10-CM | POA: Diagnosis not present

## 2022-09-09 DIAGNOSIS — T80211A Bloodstream infection due to central venous catheter, initial encounter: Secondary | ICD-10-CM | POA: Diagnosis not present

## 2022-09-09 DIAGNOSIS — N186 End stage renal disease: Secondary | ICD-10-CM | POA: Diagnosis not present

## 2022-09-09 DIAGNOSIS — I059 Rheumatic mitral valve disease, unspecified: Secondary | ICD-10-CM

## 2022-09-09 DIAGNOSIS — N250 Renal osteodystrophy: Secondary | ICD-10-CM | POA: Diagnosis not present

## 2022-09-09 DIAGNOSIS — M545 Low back pain, unspecified: Secondary | ICD-10-CM | POA: Diagnosis not present

## 2022-09-09 DIAGNOSIS — I120 Hypertensive chronic kidney disease with stage 5 chronic kidney disease or end stage renal disease: Secondary | ICD-10-CM | POA: Diagnosis not present

## 2022-09-09 DIAGNOSIS — Z992 Dependence on renal dialysis: Secondary | ICD-10-CM | POA: Diagnosis not present

## 2022-09-09 DIAGNOSIS — R7881 Bacteremia: Secondary | ICD-10-CM | POA: Diagnosis not present

## 2022-09-09 HISTORY — PX: IR REMOVAL TUN CV CATH W/O FL: IMG2289

## 2022-09-09 LAB — BASIC METABOLIC PANEL
Anion gap: 24 — ABNORMAL HIGH (ref 5–15)
BUN: 109 mg/dL — ABNORMAL HIGH (ref 8–23)
CO2: 20 mmol/L — ABNORMAL LOW (ref 22–32)
Calcium: 9 mg/dL (ref 8.9–10.3)
Chloride: 85 mmol/L — ABNORMAL LOW (ref 98–111)
Creatinine, Ser: 12.26 mg/dL — ABNORMAL HIGH (ref 0.61–1.24)
GFR, Estimated: 4 mL/min — ABNORMAL LOW (ref >60)
Glucose, Bld: 127 mg/dL — ABNORMAL HIGH (ref 70–99)
Potassium: 4.8 mmol/L (ref 3.5–5.1)
Sodium: 129 mmol/L — ABNORMAL LOW (ref 135–145)

## 2022-09-09 LAB — CBC
HCT: 32.6 % — ABNORMAL LOW (ref 39.0–52.0)
Hemoglobin: 11.2 g/dL — ABNORMAL LOW (ref 13.0–17.0)
MCH: 28.5 pg (ref 26.0–34.0)
MCHC: 34.4 g/dL (ref 30.0–36.0)
MCV: 83 fL (ref 80.0–100.0)
Platelets: 101 10*3/uL — ABNORMAL LOW (ref 150–400)
RBC: 3.93 MIL/uL — ABNORMAL LOW (ref 4.22–5.81)
RDW: 14.3 % (ref 11.5–15.5)
WBC: 27.9 10*3/uL — ABNORMAL HIGH (ref 4.0–10.5)
nRBC: 0 % (ref 0.0–0.2)

## 2022-09-09 LAB — CULTURE, BLOOD (ROUTINE X 2): Special Requests: ADEQUATE

## 2022-09-09 LAB — HEPATITIS B SURFACE ANTIBODY, QUANTITATIVE: Hep B S AB Quant (Post): 702 m[IU]/mL (ref >9.9)

## 2022-09-09 MED ORDER — ANTICOAGULANT SODIUM CITRATE 4% (200MG/5ML) IV SOLN: 5.0000 mL | Status: DC | PRN

## 2022-09-09 MED ORDER — PANTOPRAZOLE SODIUM 40 MG PO TBEC
40.0000 mg | DELAYED_RELEASE_TABLET | Freq: Every day | ORAL | Status: DC
  Administered 2022-09-09 – 2022-09-25 (×16): 40 mg via ORAL
  Filled 2022-09-09 (×18): qty 1

## 2022-09-09 MED ORDER — LIDOCAINE HCL 1 % IJ SOLN
INTRAMUSCULAR | Status: AC
  Filled 2022-09-09: qty 20

## 2022-09-09 MED ORDER — PENTAFLUOROPROP-TETRAFLUOROETH EX AERO: 1.0000 | INHALATION_SPRAY | CUTANEOUS | Status: DC | PRN

## 2022-09-09 MED ORDER — CALCIUM CARBONATE ANTACID 500 MG PO CHEW
2.0000 | CHEWABLE_TABLET | Freq: Once | ORAL | Status: AC
  Administered 2022-09-09: 400 mg via ORAL
  Filled 2022-09-09: qty 2

## 2022-09-09 MED ORDER — HEPARIN SODIUM (PORCINE) 1000 UNIT/ML DIALYSIS
3000.0000 [IU] | INTRAMUSCULAR | Status: DC | PRN
  Administered 2022-09-09: 3000 [IU] via INTRAVENOUS_CENTRAL
  Filled 2022-09-09: qty 3

## 2022-09-09 MED ORDER — LIDOCAINE HCL (PF) 1 % IJ SOLN: 5.0000 mL | INTRAMUSCULAR | Status: DC | PRN

## 2022-09-09 MED ORDER — SODIUM CHLORIDE 0.9 % IV SOLN
12.5000 mg | Freq: Four times a day (QID) | INTRAVENOUS | Status: DC | PRN
  Administered 2022-09-09 – 2022-09-15 (×8): 12.5 mg via INTRAVENOUS
  Filled 2022-09-09 (×8): qty 0.5

## 2022-09-09 MED ORDER — ALTEPLASE 2 MG IJ SOLR: 2.0000 mg | Freq: Once | INTRAMUSCULAR | Status: DC | PRN

## 2022-09-09 MED ORDER — LIDOCAINE-PRILOCAINE 2.5-2.5 % EX CREA: 1.0000 | TOPICAL_CREAM | CUTANEOUS | Status: DC | PRN

## 2022-09-09 MED ORDER — HEPARIN SODIUM (PORCINE) 1000 UNIT/ML DIALYSIS
1000.0000 [IU] | INTRAMUSCULAR | Status: DC | PRN
  Administered 2022-09-09: 1000 [IU]
  Filled 2022-09-09 (×2): qty 1

## 2022-09-09 NOTE — Progress Notes (Signed)
POST HD TX NOTE  09/09/22 1155  Vitals  Temp 98.3 F (36.8 C)  Temp Source Oral  BP (!) 109/97  MAP (mmHg) 103  BP Location Right Arm  BP Method Automatic  Patient Position (if appropriate) Lying  Pulse Rate 81  Pulse Rate Source Monitor  ECG Heart Rate 82  Resp (!) 22  Oxygen Therapy  SpO2 95 %  O2 Device Nasal Cannula  O2 Flow Rate (L/min) 2 L/min  Pulse Oximetry Type Continuous  During Treatment Monitoring  Intra-Hemodialysis Comments (S)   (post HD tx VS check)  Post Treatment  Dialyzer Clearance Lightly streaked  Duration of HD Treatment -hour(s) 3.75 hour(s)  Hemodialysis Intake (mL) 100 mL (Ancef)  Liters Processed 90  Fluid Removed (mL) 2000 mL ( (value from machine) - (ancef) = )  Tolerated HD Treatment Yes  Post-Hemodialysis Comments (S)  tx completed w/o problem, UF goal met, blood rinsed back, VSS: Medication Admin: Heparin 3000 units bolus, Ancef 2G IVPB, Heparin Dwells 3800 units  Hemodialysis Catheter Right Internal jugular Permanent  Placement Date/Time: 12/21/17 1143   Placed prior to admission: No  Time Out: Correct procedure;Correct patient;Correct site  Maximum sterile barrier precautions: Hand hygiene;Sterile gown;Cap;Sterile gloves;Mask;Large sterile sheet  Site Prep: Chlorh...  Site Condition No complications  Blue Lumen Status Heparin locked;Dead end cap in place  Red Lumen Status Heparin locked;Dead end cap in place  Purple Lumen Status N/A  Catheter fill solution Heparin 1000 units/ml  Catheter fill volume (Arterial) 1.9 cc  Catheter fill volume (Venous) 1.9  Dressing Type Transparent  Dressing Status Antimicrobial disc in place;Clean, Dry, Intact  Drainage Description None  Dressing Change Due 09/16/22  Post treatment catheter status Capped and Clamped

## 2022-09-09 NOTE — Procedures (Signed)
PROCEDURE SUMMARY:  Successful removal of tunneled hemodialysis catheter.  Patient tolerated well.  EBL < 5 mL  See full dictation in Imaging for details.  Lynann Bologna Betheny Suchecki PA-C 09/09/2022 1:23 PM

## 2022-09-09 NOTE — Progress Notes (Signed)
[facility] Progress Note   Assessment/ Plan:   Dialysis Orders:  NW TTS 3:45 450/A1.5x 2K/2Ca EDW 85.7kg TDC  -Heparin 3000 U  -No ESA -Calcitriol 1.5      Assessment/Plan: MSSA bacteremia - 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 ID following. Repeat blood cultures ordered--+ MSSA. On cefazolin. HD today followed by [facility] removal, ordered.    Possible L5-S1 osteomyelitis/discitis - as per #1  ESRD -  HD TTS. HD 6/18, first shift, then [facility] removal.   Access -  Had L AVF ligation in 2019. Last seen by Dr. Edilia Bo 01/2022 -limited mobility in R arm so planning for L arm graft but had not scheduled surgery yet.   Hypertension/volume  - BP/volume acceptable.  Anemia  - Hb at goal. No ESA needs currently.  Metabolic bone disease -  Ca acceptable. Continue home binders/calcitriol  Nutrition - Renal diet with fluid restriction.     Subjective:    Seen on HD.  Having back spasms and hiccups/ reflux like symptoms.  Will get [facility] removed after this. Blood cultures + for MSSA bacteremia.     Objective:   BP (!) 109/97 (BP Location: Right Arm)   Pulse 81   Temp 98.3 F (36.8 C) (Oral)   Resp (!) 22   Ht 5\' 9"  (1.753 m)   Wt 81 kg   SpO2 95%   BMI 26.37 kg/m   Physical Exam: [id number] somewhat uncomfortable CVS: RRR Resp: clear Abd: soft Ext: no LE edema ACCESS: R internal jugular TDC  Labs: BMET Recent Labs  Lab 09/07/22 0215 09/08/22 1128 09/09/22 0349  NA 135 137 129*  K 4.1 5.0 4.8  CL 91* 87* 85*  CO2 28 23 20*  GLUCOSE 142* 133* 127*  BUN 43* 88* 109*  CREATININE 8.42* 12.10* 12.26*  CALCIUM 9.0 9.4 9.0   CBC Recent Labs  Lab 09/07/22 0215 09/08/22 1128 09/09/22 0349  WBC 23.9* 29.8* 27.9*  NEUTROABS 21.0*  --   --   HGB 11.1* 11.8* 11.2*  HCT 34.7* 35.9* 32.6*  MCV 87.8 84.1 83.0  PLT 154 PLATELET CLUMPS NOTED ON SMEAR, UNABLE TO ESTIMATE 101*      Medications:     atenolol  50 mg Oral Daily   Chlorhexidine Gluconate Cloth  6 each Topical  Daily   cinacalcet  30 mg Oral Q supper   multivitamin  1 tablet Oral QHS   pantoprazole  40 mg Oral Daily   sevelamer carbonate  1,600 mg Oral TID WC   sodium chloride flush  3 mL Intravenous Q12H     Bufford Buttner, MD 09/09/2022, 12:07 PM

## 2022-09-09 NOTE — Progress Notes (Signed)
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  MRI showed possible L5-S1 early osteomyelitis. - Patient was febrile with leukocytosis 22 K on arrival.  Respiratory viral panel negative.  He had no respiratory complaints this a.m.  Found to have MSSA bacteremia as well. - No wounds noted.  He does have a right IJ which she notes he has had less than a year.  I suspect this is the source of bacteremia. - TTE showed large rounded structure on mitral valve. Concern for endocarditis Recommendations:  -Continue cefazolin -Follow repeat blood Cx -TEE -Nephrology following and plan on HD today followed by [facility] removal. Pt needs 72 hr line holiday.   Microbiology:   Antibiotics: Vancomycin6/15 Cefepime 6/15 Metro 6/*15 Cefazolin 6/16- Cultures: Blood 6/16 2/2 MSSA 6/17 pending   SUBJECTIVE: Resting in bed. No new complaints.  Interval: Afebrile overnight. Wbc 27.9k  Review of Systems: Review of Systems  All other systems reviewed and are negative.    Scheduled Meds:  atenolol  50 mg Oral Daily   Chlorhexidine Gluconate Cloth  6 each Topical Daily   cinacalcet  30 mg Oral Q supper   multivitamin  1 tablet Oral QHS   pantoprazole  40 mg Oral Daily   sevelamer carbonate  1,600 mg Oral TID WC   sodium chloride flush  3 mL Intravenous Q12H   Continuous Infusions:    ceFAZolin (ANCEF) IV 2 g (09/09/22 1110)   methocarbamol (ROBAXIN) IV 500 mg (09/08/22 1625)   PRN Meds:.acetaminophen **OR** acetaminophen, HYDROmorphone (DILAUDID) injection, lip balm, methocarbamol (ROBAXIN) IV, ondansetron **OR** ondansetron (ZOFRAN) IV, oxyCODONE, senna, sevelamer carbonate Allergies  Allergen Reactions   Azithromycin Nausea And Vomiting and Other (See Comments)    Chest tightness  Other Reaction(s): GI Intolerance   Iodinated Contrast Media Itching and Nausea Only    Probably needs prednisone prep prior to contrast    OBJECTIVE: Vitals:   09/09/22 1130 09/09/22 1144 09/09/22 1155 09/09/22 1232  BP: 131/74 137/75 (!) 109/97 129/77  Pulse: 81 81 81 82  Resp: 17 (!) 25 (!) 22 18  Temp:   98.3 F (36.8 C) 98.2 F (36.8 C)  TempSrc:   Oral   SpO2: 94% 95% 95% 92%  Weight:   81 kg   Height:       Body mass index is 26.37 kg/m.  Physical Exam Constitutional:      General: He is not in acute distress.    Appearance: He is normal weight. He is not toxic-appearing.  HENT:     Head: Normocephalic and atraumatic.     Right Ear: External ear normal.     Left Ear: External ear normal.     Nose: No congestion or rhinorrhea.     Mouth/Throat:     Mouth: Mucous membranes are moist.  Pharynx: Oropharynx is clear.  Eyes:     Extraocular Movements: Extraocular movements intact.     Conjunctiva/sclera: Conjunctivae normal.     Pupils: Pupils are equal, round, and reactive to light.  Cardiovascular:     Rate and Rhythm: Normal rate and regular rhythm.     Heart sounds: No murmur heard.    No friction rub. No gallop.  Pulmonary:     Effort: Pulmonary effort is normal.     Breath sounds: Normal breath sounds.  Abdominal:     General: Abdomen is flat. Bowel sounds are normal.     Palpations: Abdomen is soft.  Musculoskeletal:        General: No swelling. Normal range of motion.     Cervical back: Normal range of motion and neck supple.  Skin:    General:  Skin is warm and dry.  Neurological:     General: No focal deficit present.     Mental Status: He is oriented to person, place, and time.  Psychiatric:        Mood and Affect: Mood normal.       Lab Results Lab Results  Component Value Date   WBC 27.9 (H) 09/09/2022   HGB 11.2 (L) 09/09/2022   HCT 32.6 (L) 09/09/2022   MCV 83.0 09/09/2022   PLT 101 (L) 09/09/2022    Lab Results  Component Value Date   CREATININE 12.26 (H) 09/09/2022   BUN 109 (H) 09/09/2022   NA 129 (L) 09/09/2022   K 4.8 09/09/2022   CL 85 (L) 09/09/2022   CO2 20 (L) 09/09/2022    Lab Results  Component Value Date   ALT 58 (H) 09/08/2022   AST 82 (H) 09/08/2022   ALKPHOS 106 09/08/2022   BILITOT 0.9 09/08/2022        Danelle Earthly, MD [facility] 09/09/2022, 12:34 PM  I have personally spent 52 minutes involved in face-to-face and non-face-to-face activities for this patient on the day of the visit. Professional time spent includes the following activities: Preparing to see the patient (review of tests), Obtaining and/or reviewing separately obtained history (admission/discharge record), Performing a medically appropriate examination and/or evaluation , Ordering medications/tests/procedures, referring and communicating with other health care professionals, Documenting clinical information in the EMR, Independently interpreting results (not separately reported), Communicating results to the patient/family/caregiver, Counseling and educating the patient/family/caregiver and Care coordination (not separately reported).

## 2022-09-09 NOTE — Progress Notes (Signed)
Progress Note   Patient: NYKOLAS ANDRADE ZOX:[id number] DOB: 03-15-1961 DOA: 09/07/2022     2 DOS: the patient was seen and examined on 09/09/2022   Brief hospital course: 62yo male with h/o HTN, HLD, and ESRD on HD who presented to [facility] on 6/16 with low back pain.  He was febrile with O2 sats 90%.  CT L-spine with lung nodules and dilated small bowel loops.  WBC 23,800, troponin 92.  Blood cultures pending, treated with Vanc/Cefepime.  MRI T-spine and L-spine ordered.  Patient was transferred to [facility].  Surgery consulted, no surgical concerns, signed off.  MRI with indeterminate changes at L5-S1, possible early discitis/osteomyelitis.  Recommended to have blood cultures (pending) and if negative a repeat lumbar MRI in 7-14 days.  Blood cultures positive for pan-sensitive MSSA; surveillance cultures from 6/17 NTD.  ID is consulting.  Assessment and Plan:   L5-S1 discitis/osteomyelitis with MSSA bacteremia -Patient presenting with back pain -MRI showed indeterminate changes at L5-S1, possible early discitis/osteomyelitis -Will need antibiotics for 4-6 weeks - currently on Cefepime/Vanc. -With vertebral osteo, needs testing for TB; negative quantiferon gold and HIV testing in 04/2022 -Further important considerations include nutrition (will order consult), diabetes control (A1c 5.8 in 04/2022, prediabetes) -ID is consulting -Bacteremia is likely due to line infection, plan for [facility] exchange s/p HD today -Needs recurrent blood cultures for surveillance until negative -Echo performed and shows probable vegetation; will defer to ID about whether TEE is needed   ESRD on HD -Patient on chronic TTS HD -Nephrology prn order set utilized -Nephrology is aware that patient will need HD  -Given concern for infected line, he is likely to need line exchange -He has had 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  Has indigestion ongoing, Maalox given yesterday.  Plan for [facility] exchange today.    Physical Exam: Vitals:   09/09/22 1100 09/09/22 1130 09/09/22 1144 09/09/22 1155  BP: 134/70 131/74 137/75 (!) 109/97  Pulse: 80 81 81 81  Resp: (!) 29 17 (!) 25 (!) 22  Temp:    98.3 F (36.8 C)  TempSrc:    Oral  SpO2: 96% 94% 95% 95%  Weight:    81 kg  Height:       General:  Appears calm and comfortable and is in NAD, in HD Eyes:   EOMI, normal lids, iris ENT:  grossly normal hearing, lips & tongue, mmm Neck:  no LAD, masses or thyromegaly Cardiovascular:  RRR, +2-3/6 systolic murmur, no r/g. No LE edema.  Respiratory:   CTA bilaterally with no wheezes/rales/rhonchi.  Normal respiratory effort. Abdomen:  soft, NT, ND Skin:  no rash or induration seen on limited exam Musculoskeletal:  grossly normal tone BUE/BLE, good ROM, no bony abnormality Psychiatric:  grossly normal mood and affect, speech fluent and appropriate, AOx3 Neurologic:  CN 2-12 grossly intact, moves all extremities in coordinated fashion   Radiological Exams on Admission: Independently reviewed - see discussion in A/P where applicable  ECHOCARDIOGRAM COMPLETE BUBBLE STUDY  Result Date: 09/08/2022    ECHOCARDIOGRAM REPORT  Patient Name:   XAIN PETSKA Date of Exam: 09/08/2022 Medical Rec #:  [id number]           Height:       69.0 in Accession #:    [id number]          Weight:       185.0 lb Date of Birth:  March 23, 1961            BSA:          1.999 m Patient Age:    62 years            BP:           145/84 mmHg Patient Gender: M                   HR:           74 bpm. Exam  Location:  Inpatient Procedure: 2D Echo, Cardiac Doppler, Color Doppler and Saline Contrast Bubble            Study Indications:    I50.32 Chronic diastolic heart failure  History:        Patient has prior history of Echocardiogram examinations, most                 recent 08/06/2022. Risk Factors:Hypertension and Dyslipidemia.  Sonographer:    Dondra Prader RVT RCS Referring Phys: [id number] [facility]  Sonographer Comments: Technically difficult study due to poor echo windows. Patient had trouble positioning for exam and having back spasms. IMPRESSIONS  1. Left ventricular ejection fraction, by estimation, is 55 to 60%. The left ventricle has normal function. The left ventricle has no regional wall motion abnormalities. There is moderate concentric left ventricular hypertrophy. Left ventricular diastolic parameters are consistent with Grade I diastolic dysfunction (impaired relaxation).  2. Right ventricular systolic function is normal. The right ventricular size is normal.  3. Left atrial size was mildly dilated.  4. Bubble study performed per tech's notes but I do not see any bubbles appear on the study. Consider repeat study, if clinically indicated (can do during TEE if needed).  5. There is a large, rounded structure on the anterior leflet of the mitral valve tht is partially obstructing mitral inflow with a ball-valve type motion. This was not present on echo in 5/24. Concern for endocarditis. Suggest TEE to more formally evalaute. The mitral valve is normal in structure. No evidence of mitral valve regurgitation. No evidence of mitral stenosis.  6. The right coronary cusp of the aortic valve appears fused. The aortic valve is normal in structure. There is moderate calcification of the aortic valve. Aortic valve regurgitation is mild. Mild aortic valve stenosis. Aortic regurgitation PHT measures  526 msec. Aortic valve area, by VTI measures 1.90 cm. Aortic valve mean gradient measures 10.0 mmHg. Aortic valve  Vmax measures 2.17 m/s.  7. Aortic dilatation noted. There is mild dilatation of the ascending aorta, measuring 39 mm.  8. The inferior vena cava is normal in size with greater than 50% respiratory variability, suggesting right atrial pressure of 3 mmHg. Conclusion(s)/Recommendation(s): There is a large, rounded structure on the anterior leflet of the mitral valve tht is partially obstructing mitral inflow with a ball-valve type motion. This was not present on echo in 5/24. Concern for endocarditis. Suggest TEE to more formally evalaute. FINDINGS  Left Ventricle: Left ventricular ejection fraction, by estimation, is 55 to 60%. The left ventricle has normal function. The left ventricle has no regional wall  motion abnormalities. The left ventricular internal cavity size was normal in size. There is  moderate concentric left ventricular hypertrophy. Left ventricular diastolic parameters are consistent with Grade I diastolic dysfunction (impaired relaxation). Right Ventricle: The right ventricular size is normal. No increase in right ventricular wall thickness. Right ventricular systolic function is normal. Left Atrium: Left atrial size was mildly dilated. Right Atrium: Right atrial size was normal in size. Pericardium: There is no evidence of pericardial effusion. Mitral Valve: There is a large, rounded structure on the anterior leflet of the mitral valve tht is partially obstructing mitral inflow with a ball-valve type motion. This was not present on echo in 5/24. Concern for endocarditis. Suggest TEE to more formally evalaute. The mitral valve is normal in structure. No evidence of mitral valve regurgitation. No evidence of mitral valve stenosis. Tricuspid Valve: The tricuspid valve is normal in structure. Tricuspid valve regurgitation is not demonstrated. No evidence of tricuspid stenosis. Aortic Valve: The right coronary cusp of the aortic valve appears fused. The aortic valve is normal in structure. There is  moderate calcification of the aortic valve. Aortic valve regurgitation is mild. Aortic regurgitation PHT measures 526 msec. Mild aortic stenosis is present. Aortic valve mean gradient measures 10.0 mmHg. Aortic valve peak gradient measures 18.9 mmHg. Aortic valve area, by VTI measures 1.90 cm. Pulmonic Valve: The pulmonic valve was normal in structure. Pulmonic valve regurgitation is trivial. No evidence of pulmonic stenosis. Aorta: Aortic dilatation noted. There is mild dilatation of the ascending aorta, measuring 39 mm. Venous: The inferior vena cava is normal in size with greater than 50% respiratory variability, suggesting right atrial pressure of 3 mmHg. IAS/Shunts: No atrial level shunt detected by color flow Doppler. Agitated saline contrast was given intravenously to evaluate for intracardiac shunting.  LEFT VENTRICLE PLAX 2D LVIDd:         4.60 cm   Diastology LVIDs:         3.00 cm   LV e' medial:    5.61 cm/s LV PW:         1.20 cm   LV E/e' medial:  21.0 LV IVS:        1.40 cm   LV e' lateral:   6.66 cm/s LVOT diam:     1.80 cm   LV E/e' lateral: 17.7 LV SV:         78 LV SV Index:   39 LVOT Area:     2.54 cm  RIGHT VENTRICLE            IVC RV S prime:     9.64 cm/s  IVC diam: 1.10 cm TAPSE (M-mode): 2.0 cm LEFT ATRIUM             Index LA diam:        3.60 cm 1.80 cm/m LA Vol (A2C):   70.2 ml 35.12 ml/m LA Vol (A4C):   49.4 ml 24.69 ml/m LA Biplane Vol: 61.6 ml 30.82 ml/m  AORTIC VALVE                     PULMONIC VALVE AV Area (Vmax):    1.88 cm      PV Vmax:       0.88 m/s AV Area (Vmean):   1.89 cm      PV Peak grad:  3.1 mmHg AV Area (VTI):     1.90 cm AV Vmax:           217.50 cm/s AV Vmean:  147.000 cm/s AV VTI:            0.411 m AV Peak Grad:      18.9 mmHg AV Mean Grad:      10.0 mmHg LVOT Vmax:         161.00 cm/s LVOT Vmean:        109.000 cm/s LVOT VTI:          0.307 m LVOT/AV VTI ratio: 0.75 AI PHT:            526 msec  AORTA Ao Root diam: 3.70 cm Ao Asc diam:  3.90 cm  MITRAL VALVE MV Area (PHT): 3.08 cm     SHUNTS MV Decel Time: 246 msec     Systemic VTI:  0.31 m MV E velocity: 118.00 cm/s  Systemic Diam: 1.80 cm MV A velocity: 126.00 cm/s MV E/A ratio:  0.94 Arvilla Meres MD Electronically signed by Arvilla Meres MD Signature Date/Time: 09/08/2022/4:37:49 PM    Final      Pertinent labs:     Na++ 129 CO2 20 Glucose 127 BUN 109/Creatinine 12.26/GFR 4 HS troponin 170, 139, 134 WBC 27.9 Hgb 11.2   Family Communication: None present, not contacted tody  Disposition: Status is: Inpatient Remains inpatient appropriate because: IV antibiotics, TDC exchange  Planned Discharge Destination: Home    Time spent: 35 minutes  Author: Jonah Blue, MD 09/09/2022 12:28 PM  For on call review [email or web address].

## 2022-09-09 NOTE — Procedures (Signed)
Patient seen and examined on Hemodialysis. The procedure was supervised and I have made appropriate changes. BP (!) 109/97 (BP Location: Right Arm)   Pulse 81   Temp 98.3 F (36.8 C) (Oral)   Resp (!) 22   Ht 5\' 9"  (1.753 m)   Wt 81 kg   SpO2 95%   BMI 26.37 kg/m   QB 400 mL/ min, UF goal 2L  Having back spasms and hiccups/ reflux symptoms.  Ordered PPI and gave one time dose of TUMS.  Maalox contraindicated in ESRD so was d/c'd (aluminum content)   Bufford Buttner MD [facility] [phone] 12:06 PM

## 2022-09-09 NOTE — Progress Notes (Signed)
IV in RAC infiltrated.  Informed Pharmacy.  Pharmacist told me no other interventions needed for the IV med that I gave.  Pharmacist recommended putting cold or warm compress.  Patient wanted warm compress and I put warm compress to area.  Stacie Glaze LPN

## 2022-09-09 NOTE — Progress Notes (Signed)
Dialysis nurse and I observed patient laughing at random times and speaking loudly.  Upon interviewing the patient about these "ticks" and he told me that these ticks are a new thing that came up in the past 2 weeks.  MD alerted.  Stacie Glaze LPN

## 2022-09-10 ENCOUNTER — Inpatient Hospital Stay (HOSPITAL_COMMUNITY): Payer: No Typology Code available for payment source | Admitting: Certified Registered"

## 2022-09-10 ENCOUNTER — Inpatient Hospital Stay (HOSPITAL_COMMUNITY): Payer: No Typology Code available for payment source

## 2022-09-10 DIAGNOSIS — R140 Abdominal distension (gaseous): Secondary | ICD-10-CM | POA: Diagnosis not present

## 2022-09-10 DIAGNOSIS — N186 End stage renal disease: Secondary | ICD-10-CM | POA: Diagnosis not present

## 2022-09-10 DIAGNOSIS — M4647 Discitis, unspecified, lumbosacral region: Secondary | ICD-10-CM | POA: Diagnosis not present

## 2022-09-10 DIAGNOSIS — K56609 Unspecified intestinal obstruction, unspecified as to partial versus complete obstruction: Secondary | ICD-10-CM | POA: Diagnosis not present

## 2022-09-10 DIAGNOSIS — M545 Low back pain, unspecified: Secondary | ICD-10-CM | POA: Diagnosis not present

## 2022-09-10 DIAGNOSIS — J3489 Other specified disorders of nose and nasal sinuses: Secondary | ICD-10-CM | POA: Diagnosis not present

## 2022-09-10 DIAGNOSIS — K6389 Other specified diseases of intestine: Secondary | ICD-10-CM | POA: Diagnosis not present

## 2022-09-10 DIAGNOSIS — B9561 Methicillin susceptible Staphylococcus aureus infection as the cause of diseases classified elsewhere: Secondary | ICD-10-CM | POA: Diagnosis not present

## 2022-09-10 LAB — BASIC METABOLIC PANEL
Anion gap: 18 — ABNORMAL HIGH (ref 5–15)
BUN: 72 mg/dL — ABNORMAL HIGH (ref 8–23)
CO2: 23 mmol/L (ref 22–32)
Calcium: 8.5 mg/dL — ABNORMAL LOW (ref 8.9–10.3)
Chloride: 89 mmol/L — ABNORMAL LOW (ref 98–111)
Creatinine, Ser: 8.46 mg/dL — ABNORMAL HIGH (ref 0.61–1.24)
GFR, Estimated: 7 mL/min — ABNORMAL LOW (ref >60)
Glucose, Bld: 134 mg/dL — ABNORMAL HIGH (ref 70–99)
Potassium: 4.6 mmol/L (ref 3.5–5.1)
Sodium: 130 mmol/L — ABNORMAL LOW (ref 135–145)

## 2022-09-10 LAB — CBC
HCT: 31.6 % — ABNORMAL LOW (ref 39.0–52.0)
Hemoglobin: 10.5 g/dL — ABNORMAL LOW (ref 13.0–17.0)
MCH: 27.6 pg (ref 26.0–34.0)
MCHC: 33.2 g/dL (ref 30.0–36.0)
MCV: 82.9 fL (ref 80.0–100.0)
Platelets: 161 10*3/uL (ref 150–400)
RBC: 3.81 MIL/uL — ABNORMAL LOW (ref 4.22–5.81)
RDW: 14.9 % (ref 11.5–15.5)
WBC: 23.9 10*3/uL — ABNORMAL HIGH (ref 4.0–10.5)
nRBC: 0 % (ref 0.0–0.2)

## 2022-09-10 LAB — PROTIME-INR
INR: 1.4 — ABNORMAL HIGH (ref 0.8–1.2)
Prothrombin Time: 17 seconds — ABNORMAL HIGH (ref 11.4–15.2)

## 2022-09-10 MED ORDER — GUAIFENESIN 100 MG/5ML PO LIQD
5.0000 mL | ORAL | Status: DC | PRN
  Administered 2022-09-10 – 2022-09-19 (×7): 5 mL via ORAL
  Filled 2022-09-10 (×8): qty 15

## 2022-09-10 MED ORDER — BARIUM SULFATE 2 % PO SUSP
900.0000 mL | Freq: Once | ORAL | Status: AC
  Administered 2022-09-10: 900 mL via ORAL

## 2022-09-10 MED ORDER — SODIUM ZIRCONIUM CYCLOSILICATE 10 G PO PACK
10.0000 g | PACK | Freq: Every day | ORAL | Status: DC
  Administered 2022-09-13: 10 g via ORAL
  Filled 2022-09-10 (×2): qty 1

## 2022-09-10 MED ORDER — SODIUM CHLORIDE 0.9 % IV SOLN: INTRAVENOUS | Status: DC

## 2022-09-10 NOTE — Plan of Care (Signed)

## 2022-09-10 NOTE — Progress Notes (Signed)
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  MRI showed possible L5-S1 early osteomyelitis. - Patient was febrile with leukocytosis 22 K on arrival.  Respiratory viral panel negative.  He had no respiratory complaints this a.m.  Found to have MSSA bacteremia as well. - No wounds noted.  He does have a right IJ which she notes he has had less than a year.  I suspect this is the source of bacteremia. - TTE showed large rounded structure on mitral valve. Concern for endocarditis Recommendations:  -Continue cefazolin -Follow repeat blood Cx -TEE, possibly tomorrow -TDC removed on 6/18. Pt needs 72 hr line holiday.   Microbiology:   Antibiotics: Vancomycin6/15 Cefepime 6/15 Metro 6/15 Cefazolin 6/16- Cultures: Blood 6/16 2/2 MSSA 6/17 pending   SUBJECTIVE: NO new complaints Interval: Afebrile overnight. Wbc 23k  Review of Systems: Review of Systems  All other systems reviewed and are negative.    Scheduled Meds:  atenolol  50 mg Oral Daily   Chlorhexidine Gluconate Cloth  6 each Topical Daily   cinacalcet  30 mg Oral Q supper   multivitamin  1 tablet Oral QHS   pantoprazole  40 mg Oral Daily   sevelamer carbonate  1,600 mg Oral TID WC   sodium chloride flush  3 mL Intravenous Q12H   Continuous Infusions:   ceFAZolin (ANCEF) IV Stopped (09/09/22 1140)    chlorproMAZINE (THORAZINE) 12.5 mg in sodium chloride 0.9 % 25 mL IVPB 12.5 mg (09/09/22 1956)   methocarbamol (ROBAXIN) IV 500 mg (09/08/22 1625)   PRN Meds:.acetaminophen **OR** acetaminophen, chlorproMAZINE (THORAZINE) 12.5 mg in sodium chloride 0.9 % 25 mL IVPB, guaiFENesin, HYDROmorphone (DILAUDID) injection, lip balm, methocarbamol (ROBAXIN) IV, ondansetron **OR** ondansetron (ZOFRAN) IV, oxyCODONE, senna, sevelamer carbonate Allergies  Allergen Reactions   Azithromycin Nausea And Vomiting and Other (See Comments)    Chest tightness  Other Reaction(s): GI Intolerance   Iodinated Contrast Media Itching and Nausea Only    Probably needs prednisone prep prior to contrast    OBJECTIVE: Vitals:   09/09/22 1155 09/10/22 0058 09/10/22 0512 09/10/22 0734  BP:  113/61 120/65 119/76  Pulse:  76 73 79  Resp:  20 17 18   Temp:  98.2 F (36.8 C) 97.9 F (36.6 C) 98.9 F (37.2 C)  TempSrc:  Oral Oral Oral  SpO2:  92% 95% 94%  Weight: 81 kg     Height:       Body mass index is 26.37 kg/m.  Physical Exam Constitutional:      General: He is not in acute distress.    Appearance: He is normal weight. He is not toxic-appearing.  HENT:     Head: Normocephalic and atraumatic.     Right Ear: External ear normal.     Left Ear: External ear normal.  Nose: No congestion or rhinorrhea.     Mouth/Throat:     Mouth: Mucous membranes are moist.     Pharynx: Oropharynx is clear.  Eyes:     Extraocular Movements: Extraocular movements intact.     Conjunctiva/sclera: Conjunctivae normal.     Pupils: Pupils are equal, round, and reactive to light.  Cardiovascular:     Rate and Rhythm: Normal rate and regular rhythm.     Heart sounds: No murmur heard.    No friction rub. No gallop.  Pulmonary:     Effort: Pulmonary effort is normal.     Breath sounds: Normal breath sounds.  Abdominal:     General: Abdomen is flat. Bowel sounds are normal.     Palpations: Abdomen is soft.   Musculoskeletal:        General: No swelling. Normal range of motion.     Cervical back: Normal range of motion and neck supple.  Skin:    General: Skin is warm and dry.  Neurological:     General: No focal deficit present.     Mental Status: He is oriented to person, place, and time.  Psychiatric:        Mood and Affect: Mood normal.       Lab Results Lab Results  Component Value Date   WBC 23.9 (H) 09/10/2022   HGB 10.5 (L) 09/10/2022   HCT 31.6 (L) 09/10/2022   MCV 82.9 09/10/2022   PLT 161 09/10/2022    Lab Results  Component Value Date   CREATININE 8.46 (H) 09/10/2022   BUN 72 (H) 09/10/2022   NA 130 (L) 09/10/2022   K 4.6 09/10/2022   CL 89 (L) 09/10/2022   CO2 23 09/10/2022    Lab Results  Component Value Date   ALT 58 (H) 09/08/2022   AST 82 (H) 09/08/2022   ALKPHOS 106 09/08/2022   BILITOT 0.9 09/08/2022        Danelle Earthly, MD [facility] 09/10/2022, 12:40 PM  I have personally spent 56 minutes involved in face-to-face and non-face-to-face activities for this patient on the day of the visit. Professional time spent includes the following activities: Preparing to see the patient (review of tests), Obtaining and/or reviewing separately obtained history (admission/discharge record), Performing a medically appropriate examination and/or evaluation , Ordering medications/tests/procedures, referring and communicating with other health care professionals, Documenting clinical information in the EMR, Independently interpreting results (not separately reported), Communicating results to the patient/family/caregiver, Counseling and educating the patient/family/caregiver and Care coordination (not separately reported).

## 2022-09-10 NOTE — Progress Notes (Addendum)
   Shared Decision Making/Informed Consent  The risks [esophageal damage, perforation (1:10,000 risk), bleeding, pharyngeal hematoma as well as other potential complications associated with conscious sedation including aspiration, arrhythmia, respiratory failure and death], benefits (treatment guidance and diagnostic support) and alternatives of a transesophageal echocardiogram were discussed in detail with Ryan [name] and he is willing to proceed.       NPO after midnight, TEE tomorrow. Check INR. Orders placed. Note ESRD, labs stable.

## 2022-09-10 NOTE — Progress Notes (Signed)
Ryan [name] Progress Note   Assessment/ Plan:   Dialysis Orders:  NW TTS 3:45 450/A1.5x 2K/2Ca EDW 85.7kg TDC  -Heparin 3000 U  -No ESA -Calcitriol 1.5      Assessment/Plan: MSSA bacteremia - 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 ID following. Repeat blood cultures ordered--+ MSSA. On cefazolin. TDC removed 6/18 -ID recommends 72 hour line holiday. TTE concerning for endocarditis. For TEE  Possible L5-S1 osteomyelitis/discitis - as per #1  ESRD -  HD TTS. HD 6/18.  Next HD probably Friday 6/21.  Access -  Had L AVF ligation in 2019. Last seen by Dr. Edilia Bo 01/2022 -limited mobility in R arm so planning for L arm graft but had not scheduled surgery yet.   Hypertension/volume  - BP/volume acceptable. Under dry weight.  Anemia  - Hb 10.5 Not on ESA currently -follow trends  Metabolic bone disease -  Ca acceptable. Continue home binders/calcitriol  Nutrition - Renal diet with fluid restriction.     Subjective:    TDC removed yesterday after dialysis. Seen in room -- Continued back spasms and hiccups/ reflux like symptoms. Uncomfortable in bed, wants to move more. For TEE    Objective:   BP 119/76 (BP Location: Right Arm)   Pulse 79   Temp 98.9 F (37.2 C) (Oral)   Resp 18   Ht 5\' 9"  (1.753 m)   Wt 81 kg   SpO2 94%   BMI 26.37 kg/m   Physical Exam: ZOX:[id number] somewhat uncomfortable CVS: RRR Resp: clear Abd: soft Ext: no LE edema ACCESS: None -TDC out   Labs: BMET Recent Labs  Lab 09/07/22 0215 09/08/22 1128 09/09/22 0349 09/10/22 0439  NA 135 137 129* 130*  K 4.1 5.0 4.8 4.6  CL 91* 87* 85* 89*  CO2 28 23 20* 23  GLUCOSE 142* 133* 127* 134*  BUN 43* 88* 109* 72*  CREATININE 8.42* 12.10* 12.26* 8.46*  CALCIUM 9.0 9.4 9.0 8.5*    CBC Recent Labs  Lab 09/07/22 0215 09/08/22 1128 09/09/22 0349 09/10/22 0439  WBC 23.9* 29.8* 27.9* 23.9*  NEUTROABS 21.0*  --   --   --   HGB 11.1* 11.8* 11.2* 10.5*  HCT 34.7* 35.9* 32.6* 31.6*  MCV 87.8 84.1  83.0 82.9  PLT 154 PLATELET CLUMPS NOTED ON SMEAR, UNABLE TO ESTIMATE 101* 161       Medications:     atenolol  50 mg Oral Daily   Chlorhexidine Gluconate Cloth  6 each Topical Daily   cinacalcet  30 mg Oral Q supper   multivitamin  1 tablet Oral QHS   pantoprazole  40 mg Oral Daily   sevelamer carbonate  1,600 mg Oral TID WC   sodium chloride flush  3 mL Intravenous Q12H     Tomasa Blase PA-C [name] 09/10/2022,10:31 AM

## 2022-09-10 NOTE — Care Management Important Message (Signed)
Important Message  Patient Details  Name: Ryan [name] MRN: [id number] Date of Birth: 1960-08-08   Medicare Important Message Given:  Yes     Dorena Bodo 09/10/2022, 3:27 PM

## 2022-09-10 NOTE — TOC Initial Note (Addendum)
Transition of Care [facility]) - Initial/Assessment Note    Patient Details  Name: Ryan [name] MRN: [id number] Date of Birth: 1960-04-14  Transition of Care [facility]) CM/SW Contact:    Tom-Johnson, Hershal Coria, RN Phone Number: 09/10/2022, 1:54 PM  Clinical Narrative:                  CM spoke with patient at bedside about needs for post hospital transition. Presented at [facility] with low Back pain and Fever.  Transferred to [facility].  Surgery consulted, no surgical concerns noted and signed off.  Admitted for L5-S1 discitis/osteomyelitis with MSSA bacteremia.  Blood cultures positive for pan-sensitive MSSA; On IV abx, ID following. Plan for 4-6 weeks treatment. Patient's Tunneled HD catheter removed yesterday 09/09/22 by IR with a line holiday, to replace on Friday 09/12/22.  Plan for TEE to r/o Endocarditis.   From home alone, has two supportive children. Independent with care and drive self prior to admission. Retired. Does not have DME's at home. Goes to outpatient Dialysis on a TTS schedule, drive self to and from Dialysis. PCP is  Sharin Grave, MD and uses [facility].  Awaiting PT eval for disposition.  CM will continue to follow as patient progresses with care towards discharge.     Barriers to Discharge: Continued Medical Work up   Patient Goals and CMS Choice Patient states their goals for this hospitalization and ongoing recovery are:: To return home CMS Medicare.gov Compare Post Acute Care list provided to:: Patient        Expected Discharge Plan and Services   Discharge Planning Services: CM Consult   Living arrangements for the past 2 months: Single Family Home                                      Prior Living Arrangements/Services Living arrangements for the past 2 months: Single Family Home Lives with:: Self Patient language and need for interpreter reviewed:: Yes Do you feel safe going back to the place where you live?: Yes       Need for Family Participation in Patient Care: Yes (Comment) Care giver support system in place?: Yes (comment)   Criminal Activity/Legal Involvement Pertinent to Current Situation/Hospitalization: No - Comment as needed  Activities of Daily Living      Permission Sought/Granted Permission sought to share information with : Case Manager, Family Supports Permission granted to share information with : Yes, Verbal Permission Granted              Emotional Assessment Appearance:: Appears stated age Attitude/Demeanor/Rapport: Engaged, Gracious Affect (typically observed): Accepting, Appropriate, Calm, Hopeful, Pleasant Orientation: : Oriented to Self, Oriented to Place, Oriented to  Time, Oriented to Situation Alcohol / Substance Use: Not Applicable Psych Involvement: No (comment)  Admission diagnosis:  Acute low back pain [M54.50] Fever, unspecified fever cause [R50.9] Acute right-sided low back pain without sciatica [M54.50] Patient Active Problem List   Diagnosis Date Noted   MSSA bacteremia 09/08/2022   Discitis of lumbosacral region 09/08/2022   Acute low back pain 09/07/2022   Lung nodules 09/07/2022   Elevated troponin 09/07/2022   SIRS (systemic inflammatory response syndrome) (HCC) 09/07/2022   Hypocalcemia 04/24/2020   COVID-19 01/20/2020   Diverticulitis of colon with bleeding 03/09/2019   Complication of vascular dialysis catheter 12/27/2018   Iron deficiency anemia, unspecified 05/06/2018   Cough 10/30/2017   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/22/2017   Chronic diastolic heart failure, NYHA class 3 (HCC) 10/18/2017   Hyperlipidemia 09/19/2017   Shortness of breath 09/19/2017   PND (paroxysmal nocturnal dyspnea) 09/14/2017   DOE (dyspnea on exertion) 09/14/2017   Recurrent right pleural effusion 08/28/2017   ESRD on dialysis (HCC) 08/27/2017   Lesion of vertebra 07/13/2017   Mild protein-calorie malnutrition (HCC) 05/23/2017    Fluid overload, unspecified 05/06/2017   Hyperkalemia 05/01/2017   Personal history of urinary (tract) infections 01/15/2017   Encounter for fitting and adjustment of extracorporeal dialysis catheter (HCC) 09/27/2016   Coagulation defect, unspecified (HCC) 08/28/2016   Kidney transplant failure 08/28/2016   Secondary hyperparathyroidism of renal origin (HCC) 08/28/2016   Type 2 diabetes mellitus with diabetic chronic kidney disease (HCC) 08/28/2016   Renal transplant, status post 08/26/2016   Hypertension 08/26/2016   Anemia of chronic disease 04/09/2016   EKG abnormalities    Other hydronephrosis 03/04/2016   PCP:  Sharin Grave, MD Pharmacy:   [id number] [place], [place] - [address] [place] [address] Phone: [phone] Fax: [phone]     Social Determinants of Health (SDOH) Social History: SDOH Screenings   Tobacco Use: Low Risk  (09/09/2022)   SDOH Interventions: Transportation Interventions: Intervention Not Indicated, Inpatient TOC, Patient Resources (Friends/Family)   Readmission Risk Interventions    09/10/2022    1:53 PM  Readmission Risk Prevention Plan  Transportation Screening Complete  PCP or Specialist Appt within 5-7 Days Complete  Home Care Screening Complete  Medication Review (RN CM) Referral to Pharmacy

## 2022-09-10 NOTE — Progress Notes (Addendum)
  X-cover Note: Messaged by attending physician that pt has SBO with transition point on CT.  Pt has been made NPO. Pt on schedule for TEE tomorrow. This may or may not need to be canceled.   Stomach not overdistended on CT. Will hold off on NG tube for now.  Secure chat message sent to surgery team. They had been patient previously on 09-07-2022 but signed off.  If pt develops vomiting, he will need NG tube.  Carollee Herter, DO [facility]

## 2022-09-10 NOTE — Progress Notes (Signed)
PROGRESS NOTE    Ryan [name]  GNF:[id number] DOB: 1961-01-10 DOA: 09/07/2022 PCP: Sharin Grave, MD   Brief Narrative: Ryan [name] is a 62 y.o. male with a history of hypertension, hyperlipidemia, ESRD on hemodialysis.  Patient presented secondary to low back pain and was found to have evidence of L5-S1 discitis/osteomyelitis in addition to MSSA bacteremia.  ID consulted.  Workup significant for possible endocarditis.   Assessment/Plan:  L5-S1 discitis/osteomyelitis MSSA bacteremia Confirmed possible early discitis/osteomyelitis on MRI imaging.  Blood cultures positive for MSSA.  Patient was empirically treated with vancomycin and cefepime.  Infectious disease consulted.  Patient transition to cefazolin IV with hemodialysis.  Transthoracic echocardiogram significant for a large, rounded structure on the anterior leaflet of the mitral valve concerning for endocarditis.  ID recommending transesophageal echocardiogram; cardiology consulted. -Infectious disease recommendations: TEE  ESRD on hemodialysis Nephrology consulted.  Patient presented with a tunneled dialysis catheter which was removed on 6/18 secondary to bacteremia.  Per ID, patient requires a 72-hour line holiday.  Patient last received hemodialysis on 6/18.  Intractable hiccups Patient started on Thorazine as needed.  Could consider gabapentin low-dose.  Abdominal distention Patient without a bowel movement for several days.  Patient also with an episode of emesis overnight.  Concern for possible obstruction symptoms -Abdominal x-ray and if equivocal, obtain CT abdomen pelvis -Limit narcotics -If imaging is negative for obstruction, will work on bowel regimen  Primary hypertension -Continue atenolol  Elevated troponin Previously noted. No associated chest pain. Presumed secondary to demand ischemia.  Lung nodules Noted on CT scan.  Recommendation for repeat CT scan of the chest in 18 to 24  months.   DVT prophylaxis: SCDs Code Status:   Code Status: Full Code Family Communication: None at bedside Disposition Plan: Discharge home pending completion of endocarditis workup, and outpatient antibiotic regimen recommendations   Consultants:  Infectious disease Nephrology Interventional radiology  Procedures:  Hemodialysis 6/18: Successful removal of tunneled hemodialysis catheter   Antimicrobials: Vancomycin IV Flagyl IV Cefepime IV Cefazolin IV   Subjective: Patient reports continued hiccups.  When asked about his abdominal distention, patient reports some mildly worsening distention.  He reports no bowel movement in a few days.  He also reports an episode of emesis last night.  No abdominal pain.  Objective: BP 119/76 (BP Location: Right Arm)   Pulse 79   Temp 98.9 F (37.2 C) (Oral)   Resp 18   Ht 5\' 9"  (1.753 m)   Wt 81 kg   SpO2 94%   BMI 26.37 kg/m   Examination:  General exam: Appears calm and comfortable Respiratory system: Clear to auscultation. Respiratory effort normal. Cardiovascular system: S1 & S2 heard, RRR. Gastrointestinal system: Abdomen is distended, soft and nontender. No organomegaly or masses felt. Normal bowel sounds heard. Central nervous system: Alert and oriented. No focal neurological deficits. Musculoskeletal: No calf tenderness Skin: No cyanosis. No rashes Psychiatry: Judgement and insight appear normal. Mood & affect appropriate.    Data Reviewed: I have personally reviewed following labs and imaging studies   Last CBC Lab Results  Component Value Date   WBC 23.9 (H) 09/10/2022   HGB 10.5 (L) 09/10/2022   HCT 31.6 (L) 09/10/2022   MCV 82.9 09/10/2022   MCH 27.6 09/10/2022   RDW 14.9 09/10/2022   PLT 161 09/10/2022     Last metabolic panel Lab Results  Component Value Date   GLUCOSE 134 (H) 09/10/2022   NA 130 (L) 09/10/2022   K 4.6 09/10/2022  CL 89 (L) 09/10/2022   CO2 23 09/10/2022   BUN 72 (H)  09/10/2022   CREATININE 8.46 (H) 09/10/2022   GFRNONAA 7 (L) 09/10/2022   CALCIUM 8.5 (L) 09/10/2022   PHOS 3.0 03/12/2019   PROT 8.0 09/08/2022   ALBUMIN 2.7 (L) 09/08/2022   BILITOT 0.9 09/08/2022   ALKPHOS 106 09/08/2022   AST 82 (H) 09/08/2022   ALT 58 (H) 09/08/2022   ANIONGAP 18 (H) 09/10/2022     Creatinine Clearance: Estimated Creatinine Clearance: 9.2 mL/min (A) (by C-G formula based on SCr of 8.46 mg/dL (H)).  Recent Results (from the past 240 hour(s))  Blood culture (routine x 2)     Status: Abnormal   Collection Time: 09/07/22  2:15 AM   Specimen: BLOOD  Result Value Ref Range Status   Specimen Description   Final    BLOOD LEFT ANTECUBITAL Performed at [facility], [address], [place], [place] [address]    Special Requests   Final    BOTTLES DRAWN AEROBIC AND ANAEROBIC Blood Culture adequate volume Performed at [facility], [address], [place], [place] [address]    Culture  Setup Time   Final    GRAM POSITIVE COCCI IN BOTH AEROBIC AND ANAEROBIC BOTTLES CRITICAL VALUE NOTED.  VALUE IS CONSISTENT WITH PREVIOUSLY REPORTED AND CALLED VALUE.    Culture (A)  Final    STAPHYLOCOCCUS AUREUS SUSCEPTIBILITIES PERFORMED ON PREVIOUS CULTURE WITHIN THE LAST 5 DAYS. Performed at [facility], [address] [place], [place] [address]    Report Status 09/09/2022 FINAL  Final  Blood culture (routine x 2)     Status: Abnormal   Collection Time: 09/07/22  2:51 AM   Specimen: BLOOD  Result Value Ref Range Status   Specimen Description   Final    BLOOD BLOOD RIGHT HAND Performed at [facility], [address] [place], [place] [address]    Special Requests   Final    BOTTLES DRAWN AEROBIC AND ANAEROBIC Blood Culture adequate volume Performed at [facility], [address], [place], [place] [address]    Culture  Setup Time   Final    GRAM POSITIVE COCCI IN BOTH AEROBIC AND  ANAEROBIC BOTTLES CRITICAL RESULT CALLED TO, READ BACK BY AND VERIFIED WITH: PHARMD NICK GLOGOVAC [id number] 1503 BY EC Performed at [facility], [address] [place], [place] [address]    Culture STAPHYLOCOCCUS AUREUS (A)  Final   Report Status 09/09/2022 FINAL  Final   Organism ID, Bacteria STAPHYLOCOCCUS AUREUS  Final      Susceptibility   Staphylococcus aureus - MIC*    CIPROFLOXACIN <=0.5 SENSITIVE Sensitive     ERYTHROMYCIN <=0.25 SENSITIVE Sensitive     GENTAMICIN <=0.5 SENSITIVE Sensitive     OXACILLIN 0.5 SENSITIVE Sensitive     TETRACYCLINE <=1 SENSITIVE Sensitive     VANCOMYCIN 1 SENSITIVE Sensitive     TRIMETH/SULFA <=10 SENSITIVE Sensitive     CLINDAMYCIN <=0.25 SENSITIVE Sensitive     RIFAMPIN <=0.5 SENSITIVE Sensitive     Inducible Clindamycin NEGATIVE Sensitive     LINEZOLID 2 SENSITIVE Sensitive     * STAPHYLOCOCCUS AUREUS  Blood Culture ID Panel (Reflexed)     Status: Abnormal   Collection Time: 09/07/22  2:51 AM  Result Value Ref Range Status   Enterococcus faecalis NOT DETECTED NOT DETECTED Final   Enterococcus Faecium NOT DETECTED NOT DETECTED Final   Listeria monocytogenes NOT  DETECTED NOT DETECTED Final   Staphylococcus species DETECTED (A) NOT DETECTED Final    Comment: CRITICAL RESULT CALLED TO, READ BACK BY AND VERIFIED WITH: PHARMD NICK GLOGOVAC [id number] AT 1503 BY EC    Staphylococcus aureus (BCID) DETECTED (A) NOT DETECTED Final    Comment: CRITICAL RESULT CALLED TO, READ BACK BY AND VERIFIED WITH: PHARMD NICK GLOGOVAC [id number] AT 1503 BY EC    Staphylococcus epidermidis NOT DETECTED NOT DETECTED Final   Staphylococcus lugdunensis NOT DETECTED NOT DETECTED Final   Streptococcus species NOT DETECTED NOT DETECTED Final   Streptococcus agalactiae NOT DETECTED NOT DETECTED Final   Streptococcus pneumoniae NOT DETECTED NOT DETECTED Final   Streptococcus pyogenes NOT DETECTED NOT DETECTED Final   A.calcoaceticus-baumannii NOT DETECTED NOT  DETECTED Final   Bacteroides fragilis NOT DETECTED NOT DETECTED Final   Enterobacterales NOT DETECTED NOT DETECTED Final   Enterobacter cloacae complex NOT DETECTED NOT DETECTED Final   Escherichia coli NOT DETECTED NOT DETECTED Final   Klebsiella aerogenes NOT DETECTED NOT DETECTED Final   Klebsiella oxytoca NOT DETECTED NOT DETECTED Final   Klebsiella pneumoniae NOT DETECTED NOT DETECTED Final   Proteus species NOT DETECTED NOT DETECTED Final   Salmonella species NOT DETECTED NOT DETECTED Final   Serratia marcescens NOT DETECTED NOT DETECTED Final   Haemophilus influenzae NOT DETECTED NOT DETECTED Final   Neisseria meningitidis NOT DETECTED NOT DETECTED Final   Pseudomonas aeruginosa NOT DETECTED NOT DETECTED Final   Stenotrophomonas maltophilia NOT DETECTED NOT DETECTED Final   Candida albicans NOT DETECTED NOT DETECTED Final   Candida auris NOT DETECTED NOT DETECTED Final   Candida glabrata NOT DETECTED NOT DETECTED Final   Candida krusei NOT DETECTED NOT DETECTED Final   Candida parapsilosis NOT DETECTED NOT DETECTED Final   Candida tropicalis NOT DETECTED NOT DETECTED Final   Cryptococcus neoformans/gattii NOT DETECTED NOT DETECTED Final   Meth resistant mecA/C and MREJ NOT DETECTED NOT DETECTED Final    Comment: Performed at [facility], [address], [place], [place] [address]  Respiratory (~20 pathogens) panel by PCR     Status: None   Collection Time: 09/07/22  5:45 AM   Specimen: Anterior Nasal Swab; Respiratory  Result Value Ref Range Status   Adenovirus NOT DETECTED NOT DETECTED Final   Coronavirus 229E NOT DETECTED NOT DETECTED Final    Comment: (NOTE) The Coronavirus on the Respiratory Panel, DOES NOT test for the novel  Coronavirus (2019 nCoV)    Coronavirus HKU1 NOT DETECTED NOT DETECTED Final   Coronavirus NL63 NOT DETECTED NOT DETECTED Final   Coronavirus OC43 NOT DETECTED NOT DETECTED Final   Metapneumovirus NOT DETECTED NOT DETECTED Final    Rhinovirus / Enterovirus NOT DETECTED NOT DETECTED Final   Influenza A NOT DETECTED NOT DETECTED Final   Influenza B NOT DETECTED NOT DETECTED Final   Parainfluenza Virus 1 NOT DETECTED NOT DETECTED Final   Parainfluenza Virus 2 NOT DETECTED NOT DETECTED Final   Parainfluenza Virus 3 NOT DETECTED NOT DETECTED Final   Parainfluenza Virus 4 NOT DETECTED NOT DETECTED Final   Respiratory Syncytial Virus NOT DETECTED NOT DETECTED Final   Bordetella pertussis NOT DETECTED NOT DETECTED Final   Bordetella Parapertussis NOT DETECTED NOT DETECTED Final   Chlamydophila pneumoniae NOT DETECTED NOT DETECTED Final   Mycoplasma pneumoniae NOT DETECTED NOT DETECTED Final    Comment: Performed at [facility], [address], [place], [place] [address]  SARS Coronavirus 2 by RT PCR (hospital order, performed  in [facility] hospital lab) *cepheid single result test* Anterior Nasal Swab     Status: None   Collection Time: 09/07/22  5:45 AM   Specimen: Anterior Nasal Swab  Result Value Ref Range Status   SARS Coronavirus 2 by RT PCR NEGATIVE NEGATIVE Final    Comment: (NOTE) SARS-CoV-2 target nucleic acids are NOT DETECTED.  The SARS-CoV-2 RNA is generally detectable in upper and lower respiratory specimens during the acute phase of infection. The lowest concentration of SARS-CoV-2 viral copies this assay can detect is 250 copies / mL. A negative result does not preclude SARS-CoV-2 infection and should not be used as the sole basis for treatment or other patient management decisions.  A negative result may occur with improper specimen collection / handling, submission of specimen other than nasopharyngeal swab, presence of viral mutation(s) within the areas targeted by this assay, and inadequate number of viral copies (<250 copies / mL). A negative result must be combined with clinical observations, patient history, and epidemiological information.  Fact Sheet for Patients:    RoadLapTop.co.za  Fact Sheet for Healthcare Providers: http://kim-miller.com/  This test is not yet approved or  cleared by the [place] FDA and has been authorized for detection and/or diagnosis of SARS-CoV-2 by FDA under an Emergency Use Authorization (EUA).  This EUA will remain in effect (meaning this test can be used) for the duration of the COVID-19 declaration under Section 564(b)(1) of the Act, 21 U.S.C. section 360bbb-3(b)(1), unless the authorization is terminated or revoked sooner.  Performed at [facility], [address], [place], [place] [address]   Culture, blood (Routine X 2) w Reflex to ID Panel     Status: None (Preliminary result)   Collection Time: 09/08/22 11:28 AM   Specimen: BLOOD LEFT HAND  Result Value Ref Range Status   Specimen Description BLOOD LEFT HAND  Final   Special Requests   Final    BOTTLES DRAWN AEROBIC AND ANAEROBIC Blood Culture adequate volume   Culture   Final    NO GROWTH 2 DAYS Performed at [facility], [address] [place], [place] [address]    Report Status PENDING  Incomplete  Culture, blood (Routine X 2) w Reflex to ID Panel     Status: None (Preliminary result)   Collection Time: 09/08/22 11:28 AM   Specimen: BLOOD RIGHT HAND  Result Value Ref Range Status   Specimen Description BLOOD RIGHT HAND  Final   Special Requests   Final    BOTTLES DRAWN AEROBIC AND ANAEROBIC Blood Culture adequate volume   Culture   Final    NO GROWTH 2 DAYS Performed at [facility], [address] [place], [place] [address]    Report Status PENDING  Incomplete      Radiology Studies: IR Removal Tun Cv Cath W/O FL  Result Date: 09/09/2022 INDICATION: 62 year old male with history of ESRD and tunneled hemodialysis catheter in place presents with bacteremia. Request for tunneled dialysis catheter removal. EXAM: REMOVAL OF TUNNELED HEMODIALYSIS CATHETER  MEDICATIONS: 10 mL 1 % lidocaine COMPLICATIONS: None immediate. PROCEDURE: Informed written consent was obtained from the patient following an explanation of the procedure, risks, benefits and alternatives to treatment. A time out was performed prior to the initiation of the procedure. Sterile technique was utilized including mask, sterile gloves, sterile drape, and hand hygiene. ChloraPrep was used to prep the patient's right neck, chest and existing catheter. 1% lidocaine was injected around the catheter and the  subcutaneous tunnel. The catheter was dissected out using scissors and curved hemostats until the cuff was freed from the surrounding fibrous sheath. The catheter was removed intact. Hemostasis was obtained with manual compression. A dressing was placed. The patient tolerated the procedure well without immediate post procedural complication. IMPRESSION: Successful removal of tunneled dialysis catheter. Performed by: Lawernce Ion, PA-C Electronically Signed   By: Irish Lack M.D.   On: 09/09/2022 13:32   ECHOCARDIOGRAM COMPLETE BUBBLE STUDY  Result Date: 09/08/2022    ECHOCARDIOGRAM REPORT   Patient Name:   CATRELL THORNBERRY Date of Exam: 09/08/2022 Medical Rec #:  [id number]           Height:       69.0 in Accession #:    [id number]          Weight:       185.0 lb Date of Birth:  [birth year]            BSA:          1.999 m Patient Age:    62 years            BP:           145/84 mmHg Patient Gender: M                   HR:           74 bpm. Exam Location:  Inpatient Procedure: 2D Echo, Cardiac Doppler, Color Doppler and Saline Contrast Bubble            Study Indications:    I50.32 Chronic diastolic heart failure  History:        Patient has prior history of Echocardiogram examinations, most                 recent 08/06/2022. Risk Factors:Hypertension and Dyslipidemia.  Sonographer:    Dondra Prader RVT RCS Referring Phys: [id number] [facility]  Sonographer Comments: Technically difficult study due to  poor echo windows. Patient had trouble positioning for exam and having back spasms. IMPRESSIONS  1. Left ventricular ejection fraction, by estimation, is 55 to 60%. The left ventricle has normal function. The left ventricle has no regional wall motion abnormalities. There is moderate concentric left ventricular hypertrophy. Left ventricular diastolic parameters are consistent with Grade I diastolic dysfunction (impaired relaxation).  2. Right ventricular systolic function is normal. The right ventricular size is normal.  3. Left atrial size was mildly dilated.  4. Bubble study performed per tech's notes but I do not see any bubbles appear on the study. Consider repeat study, if clinically indicated (can do during TEE if needed).  5. There is a large, rounded structure on the anterior leflet of the mitral valve tht is partially obstructing mitral inflow with a ball-valve type motion. This was not present on echo in 5/24. Concern for endocarditis. Suggest TEE to more formally evalaute. The mitral valve is normal in structure. No evidence of mitral valve regurgitation. No evidence of mitral stenosis.  6. The right coronary cusp of the aortic valve appears fused. The aortic valve is normal in structure. There is moderate calcification of the aortic valve. Aortic valve regurgitation is mild. Mild aortic valve stenosis. Aortic regurgitation PHT measures  526 msec. Aortic valve area, by VTI measures 1.90 cm. Aortic valve mean gradient measures 10.0 mmHg. Aortic valve Vmax measures 2.17 m/s.  7. Aortic dilatation noted. There is mild dilatation of the ascending aorta, measuring 39 mm.  8. The  inferior vena cava is normal in size with greater than 50% respiratory variability, suggesting right atrial pressure of 3 mmHg. Conclusion(s)/Recommendation(s): There is a large, rounded structure on the anterior leflet of the mitral valve tht is partially obstructing mitral inflow with a ball-valve type motion. This was not present  on echo in 5/24. Concern for endocarditis. Suggest TEE to more formally evalaute. FINDINGS  Left Ventricle: Left ventricular ejection fraction, by estimation, is 55 to 60%. The left ventricle has normal function. The left ventricle has no regional wall motion abnormalities. The left ventricular internal cavity size was normal in size. There is  moderate concentric left ventricular hypertrophy. Left ventricular diastolic parameters are consistent with Grade I diastolic dysfunction (impaired relaxation). Right Ventricle: The right ventricular size is normal. No increase in right ventricular wall thickness. Right ventricular systolic function is normal. Left Atrium: Left atrial size was mildly dilated. Right Atrium: Right atrial size was normal in size. Pericardium: There is no evidence of pericardial effusion. Mitral Valve: There is a large, rounded structure on the anterior leflet of the mitral valve tht is partially obstructing mitral inflow with a ball-valve type motion. This was not present on echo in 5/24. Concern for endocarditis. Suggest TEE to more formally evalaute. The mitral valve is normal in structure. No evidence of mitral valve regurgitation. No evidence of mitral valve stenosis. Tricuspid Valve: The tricuspid valve is normal in structure. Tricuspid valve regurgitation is not demonstrated. No evidence of tricuspid stenosis. Aortic Valve: The right coronary cusp of the aortic valve appears fused. The aortic valve is normal in structure. There is moderate calcification of the aortic valve. Aortic valve regurgitation is mild. Aortic regurgitation PHT measures 526 msec. Mild aortic stenosis is present. Aortic valve mean gradient measures 10.0 mmHg. Aortic valve peak gradient measures 18.9 mmHg. Aortic valve area, by VTI measures 1.90 cm. Pulmonic Valve: The pulmonic valve was normal in structure. Pulmonic valve regurgitation is trivial. No evidence of pulmonic stenosis. Aorta: Aortic dilatation noted.  There is mild dilatation of the ascending aorta, measuring 39 mm. Venous: The inferior vena cava is normal in size with greater than 50% respiratory variability, suggesting right atrial pressure of 3 mmHg. IAS/Shunts: No atrial level shunt detected by color flow Doppler. Agitated saline contrast was given intravenously to evaluate for intracardiac shunting.  LEFT VENTRICLE PLAX 2D LVIDd:         4.60 cm   Diastology LVIDs:         3.00 cm   LV e' medial:    5.61 cm/s LV PW:         1.20 cm   LV E/e' medial:  21.0 LV IVS:        1.40 cm   LV e' lateral:   6.66 cm/s LVOT diam:     1.80 cm   LV E/e' lateral: 17.7 LV SV:         78 LV SV Index:   39 LVOT Area:     2.54 cm  RIGHT VENTRICLE            IVC RV S prime:     9.64 cm/s  IVC diam: 1.10 cm TAPSE (M-mode): 2.0 cm LEFT ATRIUM             Index LA diam:        3.60 cm 1.80 cm/m LA Vol (A2C):   70.2 ml 35.12 ml/m LA Vol (A4C):   49.4 ml 24.69 ml/m LA Biplane Vol: 61.6 ml 30.82 ml/m  AORTIC VALVE                     PULMONIC VALVE AV Area (Vmax):    1.88 cm      PV Vmax:       0.88 m/s AV Area (Vmean):   1.89 cm      PV Peak grad:  3.1 mmHg AV Area (VTI):     1.90 cm AV Vmax:           217.50 cm/s AV Vmean:          147.000 cm/s AV VTI:            0.411 m AV Peak Grad:      18.9 mmHg AV Mean Grad:      10.0 mmHg LVOT Vmax:         161.00 cm/s LVOT Vmean:        109.000 cm/s LVOT VTI:          0.307 m LVOT/AV VTI ratio: 0.75 AI PHT:            526 msec  AORTA Ao Root diam: 3.70 cm Ao Asc diam:  3.90 cm MITRAL VALVE MV Area (PHT): 3.08 cm     SHUNTS MV Decel Time: 246 msec     Systemic VTI:  0.31 m MV E velocity: 118.00 cm/s  Systemic Diam: 1.80 cm MV A velocity: 126.00 cm/s MV E/A ratio:  0.94 Arvilla Meres MD Electronically signed by Arvilla Meres MD Signature Date/Time: 09/08/2022/4:37:49 PM    Final       LOS: 3 days    Jacquelin Hawking, MD [facility] 09/10/2022, 8:34 AM   If 7PM-7AM, please contact night-coverage [email or web address]

## 2022-09-11 ENCOUNTER — Inpatient Hospital Stay (HOSPITAL_COMMUNITY): Payer: No Typology Code available for payment source

## 2022-09-11 ENCOUNTER — Encounter (HOSPITAL_COMMUNITY)
Admission: EM | Disposition: A | Discharge status: Rehabilitation Facility | Payer: Self-pay | Source: Home / Self Care | Attending: Family Medicine

## 2022-09-11 ENCOUNTER — Encounter (HOSPITAL_COMMUNITY): Payer: Self-pay | Admitting: Family Medicine

## 2022-09-11 ENCOUNTER — Other Ambulatory Visit: Payer: Self-pay

## 2022-09-11 DIAGNOSIS — M4647 Discitis, unspecified, lumbosacral region: Secondary | ICD-10-CM | POA: Diagnosis not present

## 2022-09-11 DIAGNOSIS — K5669 Other partial intestinal obstruction: Secondary | ICD-10-CM | POA: Diagnosis not present

## 2022-09-11 DIAGNOSIS — K6389 Other specified diseases of intestine: Secondary | ICD-10-CM | POA: Diagnosis not present

## 2022-09-11 DIAGNOSIS — R197 Diarrhea, unspecified: Secondary | ICD-10-CM | POA: Diagnosis not present

## 2022-09-11 LAB — CBC WITH DIFFERENTIAL/PLATELET
Abs Immature Granulocytes: 1.23 10*3/uL — ABNORMAL HIGH (ref 0.00–0.07)
Basophils Absolute: 0.1 10*3/uL (ref 0.0–0.1)
Basophils Relative: 0 %
Eosinophils Absolute: 0.2 10*3/uL (ref 0.0–0.5)
Eosinophils Relative: 1 %
HCT: 32.3 % — ABNORMAL LOW (ref 39.0–52.0)
Hemoglobin: 10.9 g/dL — ABNORMAL LOW (ref 13.0–17.0)
Immature Granulocytes: 4 %
Lymphocytes Relative: 4 %
Lymphs Abs: 1.2 10*3/uL (ref 0.7–4.0)
MCH: 27.5 pg (ref 26.0–34.0)
MCHC: 33.7 g/dL (ref 30.0–36.0)
MCV: 81.6 fL (ref 80.0–100.0)
Monocytes Absolute: 2.1 10*3/uL — ABNORMAL HIGH (ref 0.1–1.0)
Monocytes Relative: 8 %
Neutro Abs: 23.6 10*3/uL — ABNORMAL HIGH (ref 1.7–7.7)
Neutrophils Relative %: 83 %
Platelets: 157 10*3/uL (ref 150–400)
RBC: 3.96 MIL/uL — ABNORMAL LOW (ref 4.22–5.81)
RDW: 14.7 % (ref 11.5–15.5)
WBC: 28.5 10*3/uL — ABNORMAL HIGH (ref 4.0–10.5)
nRBC: 0.1 % (ref 0.0–0.2)

## 2022-09-11 LAB — BASIC METABOLIC PANEL
Anion gap: 23 — ABNORMAL HIGH (ref 5–15)
BUN: 105 mg/dL — ABNORMAL HIGH (ref 8–23)
CO2: 22 mmol/L (ref 22–32)
Calcium: 8.7 mg/dL — ABNORMAL LOW (ref 8.9–10.3)
Chloride: 84 mmol/L — ABNORMAL LOW (ref 98–111)
Creatinine, Ser: 10.43 mg/dL — ABNORMAL HIGH (ref 0.61–1.24)
GFR, Estimated: 5 mL/min — ABNORMAL LOW (ref >60)
Glucose, Bld: 134 mg/dL — ABNORMAL HIGH (ref 70–99)
Potassium: 4.7 mmol/L (ref 3.5–5.1)
Sodium: 129 mmol/L — ABNORMAL LOW (ref 135–145)

## 2022-09-11 LAB — CULTURE, BLOOD (ROUTINE X 2)

## 2022-09-11 LAB — MAGNESIUM: Magnesium: 2.7 mg/dL — ABNORMAL HIGH (ref 1.7–2.4)

## 2022-09-11 LAB — GLUCOSE, CAPILLARY: Glucose-Capillary: 117 mg/dL — ABNORMAL HIGH (ref 70–99)

## 2022-09-11 LAB — MRSA NEXT GEN BY PCR, NASAL: MRSA by PCR Next Gen: NOT DETECTED

## 2022-09-11 LAB — PHOSPHORUS: Phosphorus: 6.3 mg/dL — ABNORMAL HIGH (ref 2.5–4.6)

## 2022-09-11 SURGERY — INVASIVE LAB ABORTED CASE
Anesthesia: Monitor Anesthesia Care

## 2022-09-11 MED ORDER — DIATRIZOATE MEGLUMINE & SODIUM 66-10 % PO SOLN
90.0000 mL | Freq: Once | ORAL | Status: AC
  Administered 2022-09-11: 90 mL via ORAL
  Filled 2022-09-11: qty 90

## 2022-09-11 MED ORDER — CEFAZOLIN SODIUM-DEXTROSE 1-4 GM/50ML-% IV SOLN
1.0000 g | Freq: Every day | INTRAVENOUS | Status: AC
  Administered 2022-09-11 – 2022-09-14 (×4): 1 g via INTRAVENOUS
  Filled 2022-09-11 (×5): qty 50

## 2022-09-11 NOTE — Progress Notes (Signed)
Pharmacy Antibiotic Note  Ryan [name] is a 62 y.o. male with ESRD on HD via RIJ admitted on 09/07/2022 with  MSSA bacteremia with native mitral valve endocarditis currently on 72 hr line holiday .  Pharmacy has been consulted for cefazolin dosing.  Plan: The dose of Cefazolin will be adjusted to 1 g q24h based on renal function while on line holiday. Adjust cefazolin dose with restarting HD.  Height: 5\' 9"  (175.3 cm) Weight: 81 kg (178 lb 9.2 oz) IBW/kg (Calculated) : 70.7  Temp (24hrs), Avg:98 F (36.7 C), Min:97.5 F (36.4 C), Max:98.3 F (36.8 C)  Recent Labs  Lab 09/07/22 0215 09/08/22 1128 09/09/22 0349 09/10/22 0439 09/11/22 0436 09/11/22 0743  WBC 23.9* 29.8* 27.9* 23.9* 28.5*  --   CREATININE 8.42* 12.10* 12.26* 8.46*  --  10.43*  LATICACIDVEN 1.3  --   --   --   --   --     Estimated Creatinine Clearance: 7.4 mL/min (A) (by C-G formula based on SCr of 10.43 mg/dL (H)).    Allergies  Allergen Reactions   Azithromycin Nausea And Vomiting and Other (See Comments)    Chest tightness  Other Reaction(s): GI Intolerance   Iodinated Contrast Media Itching and Nausea Only    Probably needs prednisone prep prior to contrast    Antimicrobials this admission: Vancomycin 6/15 Cefepime 6/15 Metro 6/15 Cefazolin 6/16-   Microbiology results: 6/16 BCx: MSSA 6/17 Repeat BCx: no growth 6/19 Repeat BCx: no growth   Thank you for allowing pharmacy to be a part of this patient's care.  Ryan [name] Ryan [name] 09/11/2022 10:29 AM

## 2022-09-11 NOTE — Anesthesia Preprocedure Evaluation (Deleted)
Anesthesia Evaluation  Patient identified by MRN, date of birth, ID band Patient awake    Reviewed: Allergy & Precautions, NPO status , Patient's Chart, lab work & pertinent test results  History of Anesthesia Complications Negative for: history of anesthetic complications  Airway Mallampati: III  TM Distance: >3 FB Neck ROM: Full    Dental  (+) Teeth Intact, Dental Advisory Given,    Pulmonary shortness of breath, neg sleep apnea, neg COPD, neg recent URI   breath sounds clear to auscultation       Cardiovascular hypertension,  Rhythm:Regular  1. Left ventricular ejection fraction, by estimation, is 60 to 65%. The  left ventricle has normal function. The left ventricle has no regional  wall motion abnormalities. There is mild left ventricular hypertrophy.  Left ventricular diastolic parameters  are consistent with Grade I diastolic dysfunction (impaired relaxation).  The average left ventricular global longitudinal strain is -14.0 %. The  global longitudinal strain is abnormal.   2. Right ventricular systolic function is normal. The right ventricular  size is normal. There is normal pulmonary artery systolic pressure.   3. The mitral valve is normal in structure. Trivial mitral valve  regurgitation.   4. There is moderate calcification of the aortic valve. Aortic valve  regurgitation is mild. Mild aortic valve stenosis.   5. There is mild dilatation of the ascending aorta, measuring 40 mm.   6. The inferior vena cava is normal in size with greater than 50%  respiratory variability, suggesting right atrial pressure of 3 mmHg.     Neuro/Psych negative neurological ROS  negative psych ROS   GI/Hepatic negative GI ROS, Neg liver ROS,,,  Endo/Other  diabetes    Renal/GU ESRFRenal diseaseLab Results      Component                Value               Date                      NA                       129 (L)              09/11/2022                K                        4.7                 09/11/2022                CO2                      22                  09/11/2022                GLUCOSE                  134 (H)             09/11/2022                BUN                      105 (H)  09/11/2022                CREATININE               10.43 (H)           09/11/2022                CALCIUM                  8.7 (L)             09/11/2022                GFR                      9.42 (LL)           10/05/2017                GFRNONAA                 5 (L)               09/11/2022                Musculoskeletal  (+) Arthritis ,    Abdominal   Peds  Hematology  (+) Blood dyscrasia, anemia Lab Results      Component                Value               Date                      WBC                      28.5 (H)            09/11/2022                HGB                      10.9 (L)            09/11/2022                HCT                      32.3 (L)            09/11/2022                MCV                      81.6                09/11/2022                PLT                      157                 09/11/2022              Anesthesia Other Findings hiccups  Reproductive/Obstetrics                              Anesthesia Physical Anesthesia Plan  ASA: 4  Anesthesia Plan:    Post-op Pain Management: Minimal or no pain  anticipated   Induction: Intravenous  PONV Risk Score and Plan: 1 and Propofol infusion  Airway Management Planned: Nasal Cannula and Natural Airway  Additional Equipment: None  Intra-op Plan:   Post-operative Plan:   Informed Consent: I have reviewed the patients History and Physical, chart, labs and discussed the procedure including the risks, benefits and alternatives for the proposed anesthesia with the patient or authorized representative who has indicated his/her understanding and acceptance.     Dental advisory given  Plan  Discussed with: CRNA  Anesthesia Plan Comments:          Anesthesia Quick Evaluation

## 2022-09-11 NOTE — Evaluation (Signed)
Physical Therapy Evaluation Patient Details Name: Ryan [name] MRN: [id number] DOB: [birth year] Today's Date: 09/11/2022  History of Present Illness  Pt is a 62 y/o male admitted secondary to acute back pain and found to have L5-S1 discitis/osteomyelitis, SBO and MSSA bacteremia likely 2/2 line infection. Pt scheduled for TEE 6/20. PMH including but not limited to hypertension, hyperlipidemia, ESRD.   Clinical Impression  Pt presented supine in bed with HOB elevated, awake and willing to participate in therapy session. Prior to admission, pt reported that he was independent with all functional mobility and ADLs. Pt lives alone in a two level home with one step to enter. He reported that his niece will be able to stay with him and provide 24/7 assistance upon d/c. He also stated that he has made plans to stay on the main level of his home until he is fully recovered. At the time of evaluation, pt requiring min-mod A for bed mobility, min A for transfers and min guard for short distance ambulation in the room with use of RW. Pt greatly limited secondary to weakness and fatigue. Pt would continue to benefit from skilled physical therapy services at this time while admitted and after d/c to address the below listed limitations in order to improve overall safety and independence with functional mobility.        Recommendations for follow up therapy are one component of a multi-disciplinary discharge planning process, led by the attending physician.  Recommendations may be updated based on patient status, additional functional criteria and insurance authorization.  Follow Up Recommendations       Assistance Recommended at Discharge Frequent or constant Supervision/Assistance  Patient can return home with the following  A little help with walking and/or transfers;A little help with bathing/dressing/bathroom;Assistance with cooking/housework;Assist for transportation;Help with stairs or ramp for  entrance    Equipment Recommendations Rolling walker (2 wheels);BSC/3in1  Recommendations for Other Services       Functional Status Assessment Patient has had a recent decline in their functional status and demonstrates the ability to make significant improvements in function in a reasonable and predictable amount of time.     Precautions / Restrictions Precautions Precautions: Fall Restrictions Weight Bearing Restrictions: No      Mobility  Bed Mobility Overal bed mobility: Needs Assistance Bed Mobility: Rolling, Sidelying to Sit, Sit to Supine Rolling: Supervision Sidelying to sit: Mod assist   Sit to supine: Min assist   General bed mobility comments: increased time and effort, cueing for log roll technique for comfort, use of bed rails, mod A for trunk elevation to achieve an upright sitting position at EOB, min A to return bilateral LEs onto bed    Transfers Overall transfer level: Needs assistance Equipment used: Rolling walker (2 wheels) Transfers: Sit to/from Stand Sit to Stand: Min assist           General transfer comment: increased time and effort, cueing for safe hand placement, min A to fully power up into standing and for stability    Ambulation/Gait Ambulation/Gait assistance: Min guard Gait Distance (Feet): 25 Feet Assistive device: Rolling walker (2 wheels) Gait Pattern/deviations: Step-through pattern, Decreased step length - right, Decreased step length - left, Decreased stride length Gait velocity: decreased     General Gait Details: pt with slow, labored gait but relatively steady with use of RW, min guard for safety, limited in distance secondary to significan fatigue  Stairs            Wheelchair Mobility  Modified Rankin (Stroke Patients Only)       Balance Overall balance assessment: Needs assistance Sitting-balance support: Feet supported Sitting balance-Leahy Scale: Fair     Standing balance support: During  functional activity, Bilateral upper extremity supported, Single extremity supported Standing balance-Leahy Scale: Poor                               Pertinent Vitals/Pain Pain Assessment Pain Assessment: No/denies pain    Home Living Family/patient expects to be discharged to:: Private residence Living Arrangements: Alone Available Help at Discharge: Family;Available 24 hours/day Type of Home: House Home Access: Stairs to enter   [employer] of Steps: 1 Alternate Level Stairs-Number of Steps: flight Home Layout: Two level;1/2 bath on main level Home Equipment: None Additional Comments: has already made plans to stay on the main level until he makes a full recovery    Prior Function Prior Level of Function : Independent/Modified Independent;Driving                     Hand Dominance        Extremity/Trunk Assessment   Upper Extremity Assessment Upper Extremity Assessment: Defer to OT evaluation;RUE deficits/detail RUE Deficits / Details: pt reporting hx of rotator cuff tear. pt with weakness on the R UE, particularly with shoulder flex/abduction, 2/5 MMT with limited active ROM    Lower Extremity Assessment Lower Extremity Assessment: Generalized weakness       Communication   Communication: No difficulties  Cognition Arousal/Alertness: Awake/alert Behavior During Therapy: WFL for tasks assessed/performed Overall Cognitive Status: Within Functional Limits for tasks assessed                                          General Comments      Exercises     Assessment/Plan    PT Assessment Patient needs continued PT services  PT Problem List Decreased strength;Decreased activity tolerance;Decreased balance;Decreased mobility;Decreased coordination;Decreased knowledge of use of DME;Decreased safety awareness;Decreased knowledge of precautions       PT Treatment Interventions DME instruction;Gait training;Stair  training;Functional mobility training;Therapeutic activities;Therapeutic exercise;Balance training;Neuromuscular re-education;Patient/family education    PT Goals (Current goals can be found in the Care Plan section)  Acute Rehab PT Goals Patient Stated Goal: return to independence PT Goal Formulation: With patient Time For Goal Achievement: 09/25/22 Potential to Achieve Goals: Good    Frequency Min 3X/week     Co-evaluation               AM-PAC PT "6 Clicks" Mobility  Outcome Measure Help needed turning from your back to your side while in a flat bed without using bedrails?: None Help needed moving from lying on your back to sitting on the side of a flat bed without using bedrails?: A Lot Help needed moving to and from a bed to a chair (including a wheelchair)?: A Little Help needed standing up from a chair using your arms (e.g., wheelchair or bedside chair)?: A Little Help needed to walk in hospital room?: A Little Help needed climbing 3-5 steps with a railing? : A Lot 6 Click Score: 17    End of Session Equipment Utilized During Treatment: Gait belt Activity Tolerance: Patient limited by fatigue Patient left: in bed;with call bell/phone within reach;with bed alarm set Nurse Communication: Mobility status PT Visit Diagnosis: Other  abnormalities of gait and mobility (R26.89)    Time: 1610-9604 PT Time Calculation (min) (ACUTE ONLY): 32 min   Charges:   PT Evaluation $PT Eval Moderate Complexity: 1 Mod PT Treatments $Gait Training: 8-22 mins        Arletta Bale, DPT  Acute Rehabilitation Services Office [phone]   Ryan [name] 09/11/2022, 11:00 AM

## 2022-09-11 NOTE — Progress Notes (Signed)
   Asked to perform TEE today- reviewed echo personally which shows a large mobile mass on the AMVL consistent with vegetation. He has grown MSSA in the bloodstream.  I agree with the concerns of cardiac anesthesia that risks of the procedure may outweigh benefits -  notable labwork today shows BUN of 105 - he apparently has not had dialysis in several days.  Given recent SBO, bacteremia, ESRD and risk factors, he is not likely a cardiac surgical candidate. Therefore, would advise medical therapy with antibiotics per ID and re-evaluate the mitral valve vegetation in several months.  He is agreeable with this plan and we will cancel his TEE today.  I have notified hospital medicine, ID and nephrology of our recommendations.  Chrystie Nose, MD, [facility], FACP  [place]  [facility]  Medical Director of the [facility] Lipid Disorders &  Cardiovascular Risk Reduction Clinic Diplomate of the American Board of Clinical Lipidology Attending Cardiologist  Direct Dial: [phone]  Fax: [phone]  Website:  [email or web address]

## 2022-09-11 NOTE — Progress Notes (Signed)
Patient had medium sized loose bowel movement.

## 2022-09-11 NOTE — Progress Notes (Signed)
Progress Note     Subjective: Patient reports generalized soreness but not severe abdominal pain. He denies nausea or vomiting. He does feel bloated. He is having some diarrhea currently - incontinent with coughing.   Objective: Vital signs in last 24 hours: Temp:  [97.5 F (36.4 C)-98.3 F (36.8 C)] 97.5 F (36.4 C) (06/20 0920) Pulse Rate:  [67-73] 73 (06/20 0920) Resp:  [17-20] 18 (06/20 0920) BP: (101-131)/(56-82) 112/71 (06/20 0920) SpO2:  [91 %-95 %] 92 % (06/20 0920) Last BM Date : 09/11/22  Intake/Output from previous day: 06/19 0701 - 06/20 0700 In: 715.6 [P.O.:640; IV Piggyback:75.6] Out: 1 [Stool:1] Intake/Output this shift: No intake/output data recorded.  PE: General: pleasant, WD, WN male who is laying in bed in NAD Heart: regular, rate, and rhythm.   Lungs: Respiratory effort nonlabored Abd: soft, mild generalized TTP without peritonitis, moderate distention, reducible umbilical hernia, +BS Psych: A&Ox3 with an appropriate affect.    Lab Results:  Recent Labs    09/10/22 0439 09/11/22 0436  WBC 23.9* 28.5*  HGB 10.5* 10.9*  HCT 31.6* 32.3*  PLT 161 157   BMET Recent Labs    09/10/22 0439 09/11/22 0743  NA 130* 129*  K 4.6 4.7  CL 89* 84*  CO2 23 22  GLUCOSE 134* 134*  BUN 72* 105*  CREATININE 8.46* 10.43*  CALCIUM 8.5* 8.7*   PT/INR Recent Labs    09/10/22 1912  LABPROT 17.0*  INR 1.4*   CMP     Component Value Date/Time   NA 129 (L) 09/11/2022 0743   K 4.7 09/11/2022 0743   CL 84 (L) 09/11/2022 0743   CO2 22 09/11/2022 0743   GLUCOSE 134 (H) 09/11/2022 0743   BUN 105 (H) 09/11/2022 0743   CREATININE 10.43 (H) 09/11/2022 0743   CALCIUM 8.7 (L) 09/11/2022 0743   PROT 8.0 09/08/2022 1128   ALBUMIN 2.7 (L) 09/08/2022 1128   AST 82 (H) 09/08/2022 1128   ALT 58 (H) 09/08/2022 1128   ALKPHOS 106 09/08/2022 1128   BILITOT 0.9 09/08/2022 1128   GFRNONAA 5 (L) 09/11/2022 0743   GFRAA 13 (L) 10/25/2019 2002   Lipase      Component Value Date/Time   LIPASE 27 10/18/2017 1612       Studies/Results: DG Abd 1 View  Result Date: 09/11/2022 CLINICAL DATA:  Small-bowel obstruction EXAM: ABDOMEN - 1 VIEW COMPARISON:  KUB and CT abdomen/pelvis 1 day prior FINDINGS: Gaseous distention of small bowel in the midabdomen measuring up to 5.2 cm is not significantly changed. There is no definite free intraperitoneal air, within the confines of supine technique. There is no abnormal soft tissue calcification. A ureteral stent is noted in the right pelvis. There is no acute osseous abnormality. IMPRESSION: Similar gaseous distention of the small bowel compared to the study from 1 day prior. Electronically Signed   By: Lesia Hausen M.D.   On: 09/11/2022 08:19   CT ABDOMEN PELVIS WO CONTRAST  Result Date: 09/10/2022 CLINICAL DATA:  Bowel obstruction suspected EXAM: CT ABDOMEN AND PELVIS WITHOUT CONTRAST TECHNIQUE: Multidetector CT imaging of the abdomen and pelvis was performed following the standard protocol without IV contrast. RADIATION DOSE REDUCTION: This exam was performed according to the departmental dose-optimization program which includes automated exposure control, adjustment of the mA and/or kV according to patient size and/or use of iterative reconstruction technique. COMPARISON:  Abdominal radiographs 09/10/2022 and CT chest abdomen and pelvis 09/07/2022 FINDINGS: Lower chest: Multiple new pulmonary nodules in the  right lower lobe measuring up to 17 mm which were not present on 09/07/2022. Given rapid development these are infectious/inflammatory. The previously seen 6 mm nodule in the left lower lobe has resolved. Hepatobiliary: Mild hepatic steatosis. Decompressed gallbladder. No biliary dilation. Pancreas: Unremarkable. Spleen: Unremarkable. Adrenals/Urinary Tract: Normal adrenal glands. Atrophic native kidneys. No urinary calculi or hydronephrosis. 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 A ureteral stent again extends from the transplant kidney into the bladder. Irregular bladder wall thickening. Stomach/Bowel: Diffuse dilation of the small bowel in the anterior abdomen and pelvis there is abrupt transition point in the right anterior abdomen (series 6/image 37 and series 3/image 55) in an area of mild wall thickening. Enteric contrast is present within the small bowel reaching a point in the left lower quadrant where there is moderate dilation and wall thickening of the small bowel (circa series 3/image 64). Stomach is within normal limits. Normal caliber colon. Colonic diverticulosis without diverticulitis. Normal appendix. Vascular/Lymphatic: Aortic atherosclerosis. No enlarged abdominal or pelvic lymph nodes. Reproductive: Unremarkable. Other: No free intraperitoneal fluid or air. Fat containing left inguinal hernia. Musculoskeletal: No acute osseous abnormality or destructive osseous lesion. Dense bones consistent with renal osteodystrophy. Lucency in the L2 vertebral body are stable since 2020 consistent with Schmorl's nodes or changes of hyperparathyroidism. IMPRESSION: 1. Small-bowel obstruction with transition point in the right anterior abdomen in an area of mild wall thickening. 2. Enteric contrast is present within the small bowel reaching a point in the left lower quadrant where there is moderate dilation and apparent wall thickening. Wall thickening may be due to infectious or inflammatory enteritis however neoplastic infiltration is not excluded. Continued attention on follow-up. 3. Multiple new pulmonary nodules in the right lower lobe measuring up to 17 mm which were not present on 09/07/2022. Given rapid development these are compatible with infection and/or aspiration. 4. Irregular bladder wall thickening. Recommend correlation with urinalysis. Aortic Atherosclerosis (ICD10-I70.0). Electronically Signed   By: Minerva Fester M.D.   On: 09/10/2022 19:56    DG Abd Portable 1V  Result Date: 09/10/2022 CLINICAL DATA:  Abdominal distension. EXAM: PORTABLE ABDOMEN - 1 VIEW COMPARISON:  August 26, 2016. FINDINGS: Small bowel dilatation is noted concerning for distal small bowel obstruction. No colonic dilatation is noted. Right-sided ureteral stent is again noted. IMPRESSION: Small bowel dilatation is noted concerning for distal small bowel obstruction or possibly ileus. Electronically Signed   By: Lupita Raider M.D.   On: 09/10/2022 14:26   IR Removal Tun Cv Cath W/O FL  Result Date: 09/09/2022 INDICATION: 62 year old male with history of ESRD and tunneled hemodialysis catheter in place presents with bacteremia. Request for tunneled dialysis catheter removal. EXAM: REMOVAL OF TUNNELED HEMODIALYSIS CATHETER MEDICATIONS: 10 mL 1 % lidocaine COMPLICATIONS: None immediate. PROCEDURE: Informed written consent was obtained from the patient following an explanation of the procedure, risks, benefits and alternatives to treatment. A time out was performed prior to the initiation of the procedure. Sterile technique was utilized including mask, sterile gloves, sterile drape, and hand hygiene. ChloraPrep was used to prep the patient's right neck, chest and existing catheter. 1% lidocaine was injected around the catheter and the subcutaneous tunnel. The catheter was dissected out using scissors and curved hemostats until the cuff was freed from the surrounding fibrous sheath. The catheter was removed intact. Hemostasis was obtained with manual compression. A dressing was placed. The patient tolerated the procedure well without immediate post procedural complication. IMPRESSION: Successful removal of  tunneled dialysis catheter. Performed by: Lawernce Ion, PA-C Electronically Signed   By: Irish Lack M.D.   On: 09/09/2022 13:32    Anti-infectives: Anti-infectives (From admission, onward)    Start     Dose/Rate Route Frequency Ordered Stop   09/11/22 1800  ceFAZolin  (ANCEF) IVPB 1 g/50 mL premix        1 g 100 mL/hr over 30 Minutes Intravenous Daily-1800 09/11/22 0848     09/09/22 1200  ceFAZolin (ANCEF) IVPB 2g/100 mL premix  Status:  Discontinued        2 g 200 mL/hr over 30 Minutes Intravenous Every T-Th-Sa (Hemodialysis) 09/07/22 1840 09/11/22 0848   09/08/22 0300  ceFEPIme (MAXIPIME) 1 g in sodium chloride 0.9 % 100 mL IVPB  Status:  Discontinued        1 g 200 mL/hr over 30 Minutes Intravenous Every 24 hours 09/07/22 0539 09/07/22 0647   09/07/22 1530  ceFAZolin (ANCEF) IVPB 2g/100 mL premix        2 g 200 mL/hr over 30 Minutes Intravenous  Once 09/07/22 1520 09/07/22 1800   09/07/22 0645  vancomycin (VANCOREADY) IVPB 750 mg/150 mL        750 mg 150 mL/hr over 60 Minutes Intravenous  Once 09/07/22 0639 09/07/22 0909   09/07/22 0538  vancomycin variable dose per unstable renal function (pharmacist dosing)  Status:  Discontinued         Does not apply See admin instructions 09/07/22 0539 09/07/22 0647   09/07/22 0530  metroNIDAZOLE (FLAGYL) IVPB 500 mg  Status:  Discontinued        500 mg 100 mL/hr over 60 Minutes Intravenous Every 12 hours 09/07/22 0515 09/07/22 1551   09/07/22 0200  vancomycin (VANCOCIN) IVPB 1000 mg/200 mL premix        1,000 mg 200 mL/hr over 60 Minutes Intravenous  Once 09/07/22 0155 09/07/22 0622   09/07/22 0200  ceFEPIme (MAXIPIME) 2 g in sodium chloride 0.9 % 100 mL IVPB        2 g 200 mL/hr over 30 Minutes Intravenous  Once 09/07/22 0155 09/07/22 0350        Assessment/Plan  SBO - CT AP yesterday with SBO with transition in R anterior abdomen in area of mild bowel wall thickening - ?infections vs inflammatory enteritis vs neoplastic infiltration  - pt having diarrhea with some incontinence but no formed stool, no nausea or vomiting, mild generalized pain without peritonitis  - getting TEE today and possible tunneled HD cath  - can hold off on NGT placement for now but if n/v would recommend placement - mild  allergy to iodinated contrast - should be ok to have PO gastrografin for SBO protocol  - no indication for emergent surgical intervention but we will follow   FEN: NPO   VTE: SCDs ID: [email or web address] for bacteremia   - per TRH -  L5-S1 discitis/osteomyelitis  MSSA bacteremia  ESRD on HD HTN Lung nodules   LOS: 4 days   I reviewed Consultant cardiology, nephrology, ID notes, hospitalist notes, last 24 h vitals and pain scores, last 48 h intake and output, last 24 h labs and trends, and last 24 h imaging results.    Juliet Rude, [facility] 09/11/2022, 9:46 AM Please see Amion for pager number during day hours 7:00am-4:30pm

## 2022-09-11 NOTE — Consult Note (Signed)
Chief Complaint: Patient was seen in consultation today for tunneled vs non tunneled dialysis catheter placement Chief Complaint  Patient presents with   Back Pain    [name], pt reports cough x 3 days, that is hurting back. Pt also reports diarrhea when coughing. Pt on dialysis , T.Thrus. Sat. Last dialysis was 09/06/22. Fever 102.5 in triage, pt reports lack of appetite and increased fatigue.   at the request of Dr Casimiro Needle   Supervising Physician: Irish Lack  Patient Status: [facility] - In-pt  History of Present Illness: Ryan [name] is a 62 y.o. male   FULL Code status per pt Known to IR Tunneled dialysis catheter removed 6/18 secondary bacteremia MSSA endocarditis [facility] 6/19 NGTD Poss discitis L5-S1 Small bowel obstruction Elevated troponin-- heart cath today  72 hr Holiday--- request for new tunneled vs non tunneled dialysis catheter in IR 6/21 per Nephrology WBC 28.5 Afeb On Ancef IV daily  DR Signe Colt aware of wbc--- will discuss with Int Radiologist tomorrow as to type of cath; likely non tunneled with wbc so high.  She is aware and agreeable to non tunneled if deemed most appropriate  Past Medical History:  Diagnosis Date   Allergy    Anemia    Blood transfusion without reported diagnosis    Dialysis patient (HCC)    Tues,thurs,sat   ESRD (end stage renal disease) (HCC)    [facility]-    Family history of adverse reaction to anesthesia    father had allergic reaction with anesthesia with a dental procedure-(pt. doesn't know)   Hyperlipidemia    Hypertension     Past Surgical History:  Procedure Laterality Date   A/V FISTULAGRAM Left 12/15/2016   Procedure: A/V Fistulagram - left;  Surgeon: Chuck Hint, MD;  Location: [facility] INVASIVE CV LAB;  Service: Cardiovascular;  Laterality: Left;   AV FISTULA PLACEMENT Left 08/28/2016   Procedure: LEFT UPPER  ARM ARTERIOVENOUS (AV) FISTULA CREATION;  Surgeon: Chuck Hint, MD;  Location: [facility] OR;   Service: Vascular;  Laterality: Left;   DIALYSIS/PERMA CATHETER INSERTION Right 12/21/2017   Procedure: INSERTION OF DIALYSIS CATHETER Right Internal Jugular .;  Surgeon: Sherren Kerns, MD;  Location: [facility] OR;  Service: Vascular;  Laterality: Right;   FISTULA SUPERFICIALIZATION Left 09/23/2017   Procedure: FISTULA PLICATION LEFT ARM;  Surgeon: Sherren Kerns, MD;  Location: [facility] OR;  Service: Vascular;  Laterality: Left;   FISTULOGRAM Left 12/21/2017   Procedure: FISTULOGRAM with Balloon Angioplasty.;  Surgeon: Sherren Kerns, MD;  Location: [facility] OR;  Service: Vascular;  Laterality: Left;   HEMATOMA EVACUATION Left 08/27/2020   Procedure: EVACUATION HEMATOMA LEFT ARM;  Surgeon: Sherren Kerns, MD;  Location: [facility] OR;  Service: Vascular;  Laterality: Left;   IR REMOVAL TUN CV CATH W/O FL  09/09/2022   IR THORACENTESIS ASP PLEURAL SPACE W/IMG GUIDE  08/22/2017   1.2 L -right-sided   IR THORACENTESIS ASP PLEURAL SPACE W/IMG GUIDE  10/19/2017   KIDNEY TRANSPLANT     REVISON OF ARTERIOVENOUS FISTULA Left 12/21/2017   Procedure: PLICATION and Ligation of F LEFT ARM ARTERIOVENOUS FISTULA;  Surgeon: Sherren Kerns, MD;  Location: [facility] OR;  Service: Vascular;  Laterality: Left;   REVISON OF ARTERIOVENOUS FISTULA Left 07/16/2020   Procedure: EXCISION OF LEFT ARM ARTERIOVENOUS FISTULA;  Surgeon: Sherren Kerns, MD;  Location: [facility] OR;  Service: Vascular;  Laterality: Left;   stent in kidneys     dec 2017   TRANSTHORACIC  ECHOCARDIOGRAM  09/22/2017    Severe LVH.  Normal function -EF 55-60%.  GRII DD.  Moderate RV dilation with mildly reduced RV function.   Fixed right coronary cusp with very mild aortic stenosis.  The myocardium has a speckled appearance --> .   Recommend cardiac MRI to evaluate for amyloid   UPPER EXTREMITY VENOGRAPHY Bilateral 06/01/2020   Procedure: UPPER EXTREMITY VENOGRAPHY;  Surgeon: Chuck Hint, MD;  Location: [facility] INVASIVE CV LAB;  Service: Cardiovascular;   Laterality: Bilateral;    Allergies: Azithromycin and Iodinated contrast media  Medications: Prior to Admission medications   Medication Sig Start Date End Date Taking? Authorizing Provider  atenolol (TENORMIN) 25 MG tablet Take 50 mg by mouth daily.   Yes [provider]  atorvastatin (LIPITOR) 40 MG tablet Take 40 mg by mouth daily.   Yes [provider]  cyclobenzaprine (FLEXERIL) 10 MG tablet Take 1 tablet (10 mg total) by mouth 2 (two) times daily as needed for muscle spasms. 01/15/22  Yes Carroll Sage, PA-C  EPINEPHrine 0.3 mg/0.3 mL IJ SOAJ injection Inject 0.3 mg into the muscle as needed for anaphylaxis. 06/08/21  Yes Redwine, Madison A, PA-C  loratadine (CLARITIN) 10 MG tablet Take 10 mg by mouth daily.   Yes [provider]  multivitamin (RENA-VIT) TABS tablet Take 1 tablet by mouth daily.   Yes [provider]  sevelamer carbonate (RENVELA) 800 MG tablet Take 1 tablet (800 mg total) by mouth 3 (three) times daily with meals. Patient taking differently: Take 800-1,600 mg by mouth See admin instructions. Take 2 tablets by mouth with each meal and 1 tablet with snacks 09/24/17  Yes Sheikh, Omair Latif, DO  tetrahydrozoline-zinc (VISINE-AC) 0.05-0.25 % ophthalmic solution Place 2 drops into both eyes 3 (three) times daily as needed (dry eyes).   Yes [provider]  triamcinolone cream (KENALOG) 0.1 % Apply 1 application topically daily as needed (dry spots on face/excema).   Yes [provider]  Cetirizine HCl 10 MG CAPS Take 1 capsule (10 mg total) by mouth 1 day or 1 dose. Patient not taking: Reported on 09/08/2022 06/08/21 09/07/22  Redwine, Madison A, PA-C  Cholecalciferol (VITAMIN D3) 125 MCG (5000 UT) CAPS Take 5,000 Units by mouth daily. Patient not taking: Reported on 09/08/2022    [provider]  cinacalcet (SENSIPAR) 30 MG tablet Take 30 mg by mouth at bedtime. Patient not taking: Reported on 09/08/2022 12/08/17    [provider]  Ferrous Sulfate (IRON) 325 (65 Fe) MG TABS Take 325 mg by mouth at bedtime. Patient not taking: Reported on 09/08/2022    [provider]  OVER THE COUNTER MEDICATION Take 1 tablet by mouth daily. Zinc and Copper Patient not taking: Reported on 09/08/2022    [provider]     Family History  Problem Relation Age of Onset   Hypertension Mother    Heart disease Mother 71       By his report, he thinks that she had heart attack.   Stroke Mother    Cancer Father    Kidney cancer Father    Hypertension Sister    Heart disease Maternal Grandmother    Alcohol abuse Maternal Grandfather    Mental illness Paternal Grandmother    Learning disabilities Paternal Grandmother        Alzheimer's    Stroke Paternal Grandfather    Colon cancer Neg Hx    Colon polyps Neg Hx    Esophageal cancer Neg  Hx    Rectal cancer Neg Hx    Stomach cancer Neg Hx     Social History   Socioeconomic History   Marital status: Divorced    Spouse name: Not on file   Number of children: Not on file   Years of education: Not on file   Highest education level: Not on file  Occupational History   Not on file  Tobacco Use   Smoking status: Never   Smokeless tobacco: Never  Vaping Use   Vaping Use: Never used  Substance and Sexual Activity   Alcohol use: No   Drug use: No   Sexual activity: Not Currently    Birth control/protection: None  Other Topics Concern   Not on file  Social History Narrative   Divorced   Does Mudlogger work   3 years of college.   Does not drink, does not smoke.  Does not use illicit drugs.   Son in nursing school at [employer]   Social Determinants of Health   Financial Resource Strain: Not on file  Food Insecurity: Not on file  Transportation Needs: Not on file  Physical Activity: Not on file  Stress: Not on file  Social Connections: Not on file    Review of Systems: A 12 point ROS discussed and pertinent positives  are indicated in the HPI above.  All other systems are negative.  Review of Systems  Constitutional:  Positive for activity change. Negative for fever.  Respiratory:  Positive for cough. Negative for shortness of breath.   Cardiovascular:  Negative for chest pain.  Gastrointestinal:  Positive for diarrhea. Negative for abdominal pain.  Neurological:  Negative for weakness.  Psychiatric/Behavioral:  Negative for behavioral problems and confusion.     Vital Signs: BP 126/77   Pulse 70   Temp 98 F (36.7 C) (Temporal)   Resp (!) 22   Ht 5\' 9"  (1.753 m)   Wt 178 lb 9.2 oz (81 kg)   SpO2 92%   BMI 26.37 kg/m     Physical Exam Vitals reviewed.  HENT:     Mouth/Throat:     Mouth: Mucous membranes are moist.  Cardiovascular:     Rate and Rhythm: Normal rate and regular rhythm.     Heart sounds: Normal heart sounds.  Pulmonary:     Effort: Pulmonary effort is normal.     Breath sounds: Wheezing present.  Abdominal:     Palpations: Abdomen is soft.  Skin:    General: Skin is warm.  Neurological:     Mental Status: He is alert and oriented to person, place, and time.  Psychiatric:        Behavior: Behavior normal.     Imaging: DG Abd 1 View  Result Date: 09/11/2022 CLINICAL DATA:  Small-bowel obstruction EXAM: ABDOMEN - 1 VIEW COMPARISON:  KUB and CT abdomen/pelvis 1 day prior FINDINGS: Gaseous distention of small bowel in the midabdomen measuring up to 5.2 cm is not significantly changed. There is no definite free intraperitoneal air, within the confines of supine technique. There is no abnormal soft tissue calcification. A ureteral stent is noted in the right pelvis. There is no acute osseous abnormality. IMPRESSION: Similar gaseous distention of the small bowel compared to the study from 1 day prior. Electronically Signed   By: Lesia Hausen M.D.   On: 09/11/2022 08:19   CT ABDOMEN PELVIS WO CONTRAST  Result Date: 09/10/2022 CLINICAL DATA:  Bowel obstruction suspected  EXAM: CT ABDOMEN AND  PELVIS WITHOUT CONTRAST TECHNIQUE: Multidetector CT imaging of the abdomen and pelvis was performed following the standard protocol without IV contrast. RADIATION DOSE REDUCTION: This exam was performed according to the departmental dose-optimization program which includes automated exposure control, adjustment of the mA and/or kV according to patient size and/or use of iterative reconstruction technique. COMPARISON:  Abdominal radiographs 09/10/2022 and CT chest abdomen and pelvis 09/07/2022 FINDINGS: Lower chest: Multiple new pulmonary nodules in the right lower lobe measuring up to 17 mm which were not present on 09/07/2022. Given rapid development these are infectious/inflammatory. The previously seen 6 mm nodule in the left lower lobe has resolved. Hepatobiliary: Mild hepatic steatosis. Decompressed gallbladder. No biliary dilation. Pancreas: Unremarkable. Spleen: Unremarkable. Adrenals/Urinary Tract: Normal adrenal glands. Atrophic native kidneys. No urinary calculi or hydronephrosis. 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 A ureteral stent again extends from the transplant kidney into the bladder. Irregular bladder wall thickening. Stomach/Bowel: Diffuse dilation of the small bowel in the anterior abdomen and pelvis there is abrupt transition point in the right anterior abdomen (series 6/image 37 and series 3/image 55) in an area of mild wall thickening. Enteric contrast is present within the small bowel reaching a point in the left lower quadrant where there is moderate dilation and wall thickening of the small bowel (circa series 3/image 64). Stomach is within normal limits. Normal caliber colon. Colonic diverticulosis without diverticulitis. Normal appendix. Vascular/Lymphatic: Aortic atherosclerosis. No enlarged abdominal or pelvic lymph nodes. Reproductive: Unremarkable. Other: No free intraperitoneal fluid or air. Fat containing  left inguinal hernia. Musculoskeletal: No acute osseous abnormality or destructive osseous lesion. Dense bones consistent with renal osteodystrophy. Lucency in the L2 vertebral body are stable since 2020 consistent with Schmorl's nodes or changes of hyperparathyroidism. IMPRESSION: 1. Small-bowel obstruction with transition point in the right anterior abdomen in an area of mild wall thickening. 2. Enteric contrast is present within the small bowel reaching a point in the left lower quadrant where there is moderate dilation and apparent wall thickening. Wall thickening may be due to infectious or inflammatory enteritis however neoplastic infiltration is not excluded. Continued attention on follow-up. 3. Multiple new pulmonary nodules in the right lower lobe measuring up to 17 mm which were not present on 09/07/2022. Given rapid development these are compatible with infection and/or aspiration. 4. Irregular bladder wall thickening. Recommend correlation with urinalysis. Aortic Atherosclerosis (ICD10-I70.0). Electronically Signed   By: Minerva Fester M.D.   On: 09/10/2022 19:56   DG Abd Portable 1V  Result Date: 09/10/2022 CLINICAL DATA:  Abdominal distension. EXAM: PORTABLE ABDOMEN - 1 VIEW COMPARISON:  August 26, 2016. FINDINGS: Small bowel dilatation is noted concerning for distal small bowel obstruction. No colonic dilatation is noted. Right-sided ureteral stent is again noted. IMPRESSION: Small bowel dilatation is noted concerning for distal small bowel obstruction or possibly ileus. Electronically Signed   By: Lupita Raider M.D.   On: 09/10/2022 14:26   IR Removal Tun Cv Cath W/O FL  Result Date: 09/09/2022 INDICATION: 62 year old male with history of ESRD and tunneled hemodialysis catheter in place presents with bacteremia. Request for tunneled dialysis catheter removal. EXAM: REMOVAL OF TUNNELED HEMODIALYSIS CATHETER MEDICATIONS: 10 mL 1 % lidocaine COMPLICATIONS: None immediate. PROCEDURE: Informed  written consent was obtained from the patient following an explanation of the procedure, risks, benefits and alternatives to treatment. A time out was performed prior to the initiation of the procedure. Sterile technique was utilized including mask, sterile gloves, sterile drape, and  hand hygiene. ChloraPrep was used to prep the patient's right neck, chest and existing catheter. 1% lidocaine was injected around the catheter and the subcutaneous tunnel. The catheter was dissected out using scissors and curved hemostats until the cuff was freed from the surrounding fibrous sheath. The catheter was removed intact. Hemostasis was obtained with manual compression. A dressing was placed. The patient tolerated the procedure well without immediate post procedural complication. IMPRESSION: Successful removal of tunneled dialysis catheter. Performed by: Lawernce Ion, PA-C Electronically Signed   By: Irish Lack M.D.   On: 09/09/2022 13:32   ECHOCARDIOGRAM COMPLETE BUBBLE STUDY  Result Date: 09/08/2022    ECHOCARDIOGRAM REPORT   Patient Name:   Ryan [name] Date of Exam: 09/08/2022 Medical Rec #:  [id number]           Height:       69.0 in Accession #:    [id number]          Weight:       185.0 lb Date of Birth:  1960/06/05            BSA:          1.999 m Patient Age:    62 years            BP:           145/84 mmHg Patient Gender: M                   HR:           74 bpm. Exam Location:  Inpatient Procedure: 2D Echo, Cardiac Doppler, Color Doppler and Saline Contrast Bubble            Study Indications:    I50.32 Chronic diastolic heart failure  History:        Patient has prior history of Echocardiogram examinations, most                 recent 08/06/2022. Risk Factors:Hypertension and Dyslipidemia.  Sonographer:    Dondra Prader RVT RCS Referring Phys: [id number] [facility]  Sonographer Comments: Technically difficult study due to poor echo windows. Patient had trouble positioning for exam and having back spasms.  IMPRESSIONS  1. Left ventricular ejection fraction, by estimation, is 55 to 60%. The left ventricle has normal function. The left ventricle has no regional wall motion abnormalities. There is moderate concentric left ventricular hypertrophy. Left ventricular diastolic parameters are consistent with Grade I diastolic dysfunction (impaired relaxation).  2. Right ventricular systolic function is normal. The right ventricular size is normal.  3. Left atrial size was mildly dilated.  4. Bubble study performed per tech's notes but I do not see any bubbles appear on the study. Consider repeat study, if clinically indicated (can do during TEE if needed).  5. There is a large, rounded structure on the anterior leflet of the mitral valve tht is partially obstructing mitral inflow with a ball-valve type motion. This was not present on echo in 5/24. Concern for endocarditis. Suggest TEE to more formally evalaute. The mitral valve is normal in structure. No evidence of mitral valve regurgitation. No evidence of mitral stenosis.  6. The right coronary cusp of the aortic valve appears fused. The aortic valve is normal in structure. There is moderate calcification of the aortic valve. Aortic valve regurgitation is mild. Mild aortic valve stenosis. Aortic regurgitation PHT measures  526 msec. Aortic valve area, by VTI measures 1.90 cm. Aortic valve mean gradient measures 10.0 mmHg. Aortic  valve Vmax measures 2.17 m/s.  7. Aortic dilatation noted. There is mild dilatation of the ascending aorta, measuring 39 mm.  8. The inferior vena cava is normal in size with greater than 50% respiratory variability, suggesting right atrial pressure of 3 mmHg. Conclusion(s)/Recommendation(s): There is a large, rounded structure on the anterior leflet of the mitral valve tht is partially obstructing mitral inflow with a ball-valve type motion. This was not present on echo in 5/24. Concern for endocarditis. Suggest TEE to more formally evalaute.  FINDINGS  Left Ventricle: Left ventricular ejection fraction, by estimation, is 55 to 60%. The left ventricle has normal function. The left ventricle has no regional wall motion abnormalities. The left ventricular internal cavity size was normal in size. There is  moderate concentric left ventricular hypertrophy. Left ventricular diastolic parameters are consistent with Grade I diastolic dysfunction (impaired relaxation). Right Ventricle: The right ventricular size is normal. No increase in right ventricular wall thickness. Right ventricular systolic function is normal. Left Atrium: Left atrial size was mildly dilated. Right Atrium: Right atrial size was normal in size. Pericardium: There is no evidence of pericardial effusion. Mitral Valve: There is a large, rounded structure on the anterior leflet of the mitral valve tht is partially obstructing mitral inflow with a ball-valve type motion. This was not present on echo in 5/24. Concern for endocarditis. Suggest TEE to more formally evalaute. The mitral valve is normal in structure. No evidence of mitral valve regurgitation. No evidence of mitral valve stenosis. Tricuspid Valve: The tricuspid valve is normal in structure. Tricuspid valve regurgitation is not demonstrated. No evidence of tricuspid stenosis. Aortic Valve: The right coronary cusp of the aortic valve appears fused. The aortic valve is normal in structure. There is moderate calcification of the aortic valve. Aortic valve regurgitation is mild. Aortic regurgitation PHT measures 526 msec. Mild aortic stenosis is present. Aortic valve mean gradient measures 10.0 mmHg. Aortic valve peak gradient measures 18.9 mmHg. Aortic valve area, by VTI measures 1.90 cm. Pulmonic Valve: The pulmonic valve was normal in structure. Pulmonic valve regurgitation is trivial. No evidence of pulmonic stenosis. Aorta: Aortic dilatation noted. There is mild dilatation of the ascending aorta, measuring 39 mm. Venous: The inferior  vena cava is normal in size with greater than 50% respiratory variability, suggesting right atrial pressure of 3 mmHg. IAS/Shunts: No atrial level shunt detected by color flow Doppler. Agitated saline contrast was given intravenously to evaluate for intracardiac shunting.  LEFT VENTRICLE PLAX 2D LVIDd:         4.60 cm   Diastology LVIDs:         3.00 cm   LV e' medial:    5.61 cm/s LV PW:         1.20 cm   LV E/e' medial:  21.0 LV IVS:        1.40 cm   LV e' lateral:   6.66 cm/s LVOT diam:     1.80 cm   LV E/e' lateral: 17.7 LV SV:         78 LV SV Index:   39 LVOT Area:     2.54 cm  RIGHT VENTRICLE            IVC RV S prime:     9.64 cm/s  IVC diam: 1.10 cm TAPSE (M-mode): 2.0 cm LEFT ATRIUM             Index LA diam:        3.60 cm 1.80 cm/m LA  Vol (A2C):   70.2 ml 35.12 ml/m LA Vol (A4C):   49.4 ml 24.69 ml/m LA Biplane Vol: 61.6 ml 30.82 ml/m  AORTIC VALVE                     PULMONIC VALVE AV Area (Vmax):    1.88 cm      PV Vmax:       0.88 m/s AV Area (Vmean):   1.89 cm      PV Peak grad:  3.1 mmHg AV Area (VTI):     1.90 cm AV Vmax:           217.50 cm/s AV Vmean:          147.000 cm/s AV VTI:            0.411 m AV Peak Grad:      18.9 mmHg AV Mean Grad:      10.0 mmHg LVOT Vmax:         161.00 cm/s LVOT Vmean:        109.000 cm/s LVOT VTI:          0.307 m LVOT/AV VTI ratio: 0.75 AI PHT:            526 msec  AORTA Ao Root diam: 3.70 cm Ao Asc diam:  3.90 cm MITRAL VALVE MV Area (PHT): 3.08 cm     SHUNTS MV Decel Time: 246 msec     Systemic VTI:  0.31 m MV E velocity: 118.00 cm/s  Systemic Diam: 1.80 cm MV A velocity: 126.00 cm/s MV E/A ratio:  0.94 Arvilla Meres MD Electronically signed by Arvilla Meres MD Signature Date/Time: 09/08/2022/4:37:49 PM    Final    MR LUMBAR SPINE WO CONTRAST  Result Date: 09/07/2022 CLINICAL DATA:  62 year old male with fever of unknown origin. Dialysis patient. Cough, diarrhea. Back pain. EXAM: MRI LUMBAR SPINE WITHOUT CONTRAST TECHNIQUE: Multiplanar,  multisequence MR imaging of the lumbar spine was performed. No intravenous contrast was administered. COMPARISON:  Thoracic MRI today reported separately, CT Chest, Abdomen, and Pelvis and CT lumbar spine 0339 hours today. FINDINGS: Segmentation:  Normal, concordant with the thoracic numbering today. Alignment: Stable from the CT earlier today. Maintained lumbar lordosis. No significant scoliosis or spondylolisthesis. Vertebrae: Background heterogeneous lumbosacral and pelvic marrow signal as seen in the thoracic MRI, compatible with renal osteodystrophy demonstrated by CT. Intermittent mild and degenerative appearing endplate marrow edema from T12 through the lumbosacral junction. Such marrow edema is most pronounced at L5-S1, and there is increased STIR signal in the disc there. But no paraspinal inflammation there. See additional details of that level below. Intact visible sacrum and SI joints. Conus medullaris and cauda equina: Conus extends to the T12-L1 level. No lower spinal cord or conus signal abnormality. Fairly capacious spinal canal throughout and normal cauda equina nerve roots. Paraspinal and other soft tissues: Stable to the CT Abdomen and Pelvis this morning. No convincing paraspinal soft tissue inflammation. Disc levels: Normal for age above L5-S1. L5-S1: Disc space loss and heterogeneity, some increased central disc STIR signal. No vacuum disc by CT this morning. Mild endplate irregularity by CT there. Circumferential disc osteophyte complex, broad-based posterior component affecting the ventral epidural fat, with mild to moderate lateral recess stenosis greater on the right. No spinal stenosis. Mild to moderate bilateral degenerative foraminal stenosis. IMPRESSION: 1. Background renal osteodystrophy. Intermittent mild degenerative appearing endplate marrow edema as seen in the thoracic spine today. However, more indeterminate changes at the L5-S1 level.  Difficult to exclude Early Discitis  Osteomyelitis there, although no paraspinal inflammation at this time. Recommend blood cultures, and if negative a repeat Lumbar MRI (without and with contrast preferred, but noncontrast probably would suffice) in 7-14 days. 2. Disc and endplate degeneration at L5-S1 with up to moderate involvement of the exiting L5 and descending S1 nerves. No spinal stenosis. And age-appropriate lumbar spine degeneration otherwise. Electronically Signed   By: Odessa Fleming M.D.   On: 09/07/2022 08:47   MR THORACIC SPINE WO CONTRAST  Result Date: 09/07/2022 CLINICAL DATA:  62 year old male with fever of unknown origin. Dialysis patient. Cough, diarrhea. Back pain. EXAM: MRI THORACIC SPINE WITHOUT CONTRAST TECHNIQUE: Multiplanar, multisequence MR imaging of the thoracic spine was performed. No intravenous contrast was administered. COMPARISON:  CT Chest, Abdomen, and Pelvis 0339 hours today. FINDINGS: Limited cervical spine imaging: Some disc and endplate degeneration. Mildly heterogeneous bone marrow signal similar to that in the thoracic spine. Thoracic spine segmentation:  Appears to be normal. Alignment: Mildly exaggerated thoracic kyphosis. No spondylolisthesis or significant scoliosis. Vertebrae: Generalized heterogeneous marrow signal, likely corresponding to CT evidence of diffuse renal osteodystrophy earlier today. Mostly vague decreased T1 and T2 marrow signal. Superimposed degenerative appearing endplate changes in the thoracic spine T5 through T10 with multilevel mild focal endplate marrow edema there. No suspicious marrow edema or suspicious marrow lesion. Grossly negative visible posterior ribs. Cord: Negative. Capacious thoracic spinal canal. Conus medullaris appears normal at T12-L1. Paraspinal and other soft tissues: Stable from CT Chest, Abdomen, and Pelvis today. Negative thoracic paraspinal soft tissues. Disc levels: Widespread thoracic disc and endplate degeneration, but no significant thoracic disc herniation. No  thoracic spinal stenosis. And only occasional thoracic neural foraminal stenosis (T10-T11 mild-to-moderate T10 foraminal stenosis in part due to facet hypertrophy. IMPRESSION: Background marrow signal changes likely relating to diffuse renal osteodystrophy seen by CT today. Superimposed thoracic disc and endplate degeneration, including multilevel mild degenerative appearing endplate marrow edema in the thoracic spine. But no findings suspicious for discitis osteomyelitis at this time. No thoracic spinal stenosis. Electronically Signed   By: Odessa Fleming M.D.   On: 09/07/2022 08:39   CT L-SPINE NO CHARGE  Result Date: 09/07/2022 CLINICAL DATA:  Fever unknown origin.  Back pain. EXAM: CT LUMBAR SPINE WITHOUT CONTRAST TECHNIQUE: Multidetector CT imaging of the lumbar spine was performed without intravenous contrast administration. Multiplanar CT image reconstructions were also generated. RADIATION DOSE REDUCTION: This exam was performed according to the departmental dose-optimization program which includes automated exposure control, adjustment of the mA and/or kV according to patient size and/or use of iterative reconstruction technique. COMPARISON:  CT abdomen and pelvis with IV contrast 03/09/2019 FINDINGS: Segmentation: 5 lumbar type vertebrae. Alignment: Within normal limits. Vertebrae: There have been no significant interval changes. The bones are again noted dense consistent with renal osteodystrophy. There are prominent lucencies in the superior and inferior endplates of L2 which were noted previously and could be large Schmorl's nodes or changes of hyperparathyroidism. There are additional slight central endplate depressions L4 and 5 which were noted previously, with discogenic endplate irregularity opposite of L5-S1. No fracture or other acute abnormality is seen. No destructive or aggressive bone lesion. Paraspinal and other soft tissues: Aortoiliac atherosclerosis. Atrophic native kidneys. No acute  findings. Disc levels: There are normal disc heights except for moderate disc space loss once again T11-12 and T12-L1. There are no herniated discs or significant disc bulges from T12-L1 through L2-3. At L3-4, there is mild diffuse nonstenosing annular bulge which was  seen previously. There is mild foraminal narrowing due to inferior foraminal disc bulging. No herniation or significant canal encroachment. At L4-5, there is a mild diffuse annular bulge without herniation or canal zone stenosis. The foramina are clear. There is only slight facet spurring. At L5-S1, there is a broad-based posterior disc bulge abutting both S1 nerve roots without displacement or compression, herniation or stenosis. Due to inferior foraminal disc bulging and facet spurring there is moderate bilateral foraminal stenosis. The SI joints are patent with mild spurring. No erosive arthropathy is seen. IMPRESSION: 1. No acute osseous abnormalities.  No appreciable interval changes. 2. Lumbar degenerative changes and findings of renal osteodystrophy. 3. Prominent lucencies in the superior and inferior endplates of L2 which could be large Schmorl's nodes or changes of hyperparathyroidism. Similar findings in 2020. 4. Aortic atherosclerosis. 5. Atrophic native kidneys. Aortic Atherosclerosis (ICD10-I70.0). Electronically Signed   By: Almira Bar M.D.   On: 09/07/2022 04:47   CT CHEST ABDOMEN PELVIS WO CONTRAST  Result Date: 09/07/2022 CLINICAL DATA:  Fever of unknown origin. EXAM: CT CHEST, ABDOMEN AND PELVIS WITHOUT CONTRAST TECHNIQUE: Multidetector CT imaging of the chest, abdomen and pelvis was performed following the standard protocol without IV contrast. RADIATION DOSE REDUCTION: This exam was performed according to the departmental dose-optimization program which includes automated exposure control, adjustment of the mA and/or kV according to patient size and/or use of iterative reconstruction technique. COMPARISON:  Portable chest  today, PA Lat chest 12/29/2017. Chest CT with contrast 12/25/2017, abdomen and pelvis CT with contrast 03/09/2019, abdomen and pelvis CT no contrast 07/07/2017. FINDINGS: CT CHEST FINDINGS Cardiovascular: The cardiac size is normal, was previously enlarged on all prior CTs. The pulmonary trunk remains prominent at 3.3 cm indicating arterial hypertension. No venous distention is seen. There is a dialysis catheter via right IJ approach, the tip in the upper right atrium. There are moderate calcifications of the aortic valve leaflets, mild-to-moderate calcifications in the thoracic aorta without aneurysm. Scattered calcific plaque in the great vessels with normal great vessel branching. Mediastinum/Nodes: Numerous tiny lymph nodes are scattered throughout the mediastinum. These are less prominent than in 2019. Largest is 1.1 cm in short axis in the left prevascular space and similar to the prior study. Others are not enlarged and most of them are less than 5 mm. There is no esophageal thickening, tracheal or main bronchus filling defect. Thyroid gland and axillary spaces are unremarkable. Lungs/Pleura: Lung bases show scattered subsegmental level. There is new demonstration of a posterior basal left lower lobe 6 mm noncalcified nodule on 6:110. There is new demonstration of a 3 mm noncalcified right upper lobe nodule on 6:47. The remainder of the bilateral lungs are clear. No focal pneumonia is evident. Musculoskeletal: Bilateral subareolar gynecomastia appears similar. There are multilevel endplate Schmorl's nodes. Mild thoracic kyphosis. There is thoracic spondylosis. No aggressive osseous lesion. CT ABDOMEN PELVIS FINDINGS Hepatobiliary: The liver is 18.6 cm length and mildly steatotic. No mass is seen without contrast. The gallbladder and bile ducts are unremarkable. Pancreas: Unremarkable without contrast. Spleen: Unremarkable without contrast. Adrenals/Urinary Tract: There is no adrenal mass. The native kidneys  are atrophic. There are multiple small cysts and additional too small to characterize subcentimeter hypodensities. No specific follow-up recommendation is made. There are no intrarenal stones in the native kidneys no hydronephrosis. 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 Findings consistent with chronic transplant rejection. Ureteral stent again extends from  this transplant into the bladder. There is diffuse irregular bladder thickening which was seen on prior studies as well with perivesical stranding. Correlate clinically for chronic cystitis. Stomach/Bowel: The stomach, duodenum and proximal jejunum are unremarkable but there are dilated segments of distal jejunum and ileum with operative at adjuvant up to 3 cm, or decompression of the remainder. A transitional segment could not be found. Findings are concerning for a low-grade approximately mid ileal small-bowel obstruction potential due to occult adhesions or occult internal hernia. The appendix is normal caliber. There is diffuse colonic diverticulosis without evidence of diverticulitis. Vascular/Lymphatic: Aortic atherosclerosis. No enlarged abdominal or pelvic lymph nodes. Reproductive: Borderline prostate size 4.4 cm transverse. Both testicles are in the scrotal sac. Other: Small umbilical and left inguinal fat hernias. No incarcerated hernia. There is no free fluid, free hemorrhage, or free air. Musculoskeletal: Dense bones consistent with renal osteodystrophy. Lucencies in the L2 vertebral body are stable since 2020 consistent with prominent Schmorl's nodes or changes of hyperparathyroidism. There is no aggressive bone lesion regional skeletal fracture. IMPRESSION: 1. No focal pneumonia is seen. 2. 6 mm left lower lobe and 3 mm right upper lobe noncalcified nodules. Per Fleischner Society Guidelines, recommend a non-contrast Chest CT at 3-6  months, then consider another non-contrast Chest CT at 18-24 months. If patient is low risk for malignancy, non-contrast Chest CT at 18-24 months is optional. These guidelines do not apply to immunocompromised patients and patients with cancer. Follow up in patients with significant comorbidities as clinically warranted. For lung cancer screening, adhere to Lung-RADS guidelines. Reference: Radiology. 2017; 284(1):228-43. 3. Aortic and coronary artery atherosclerosis. 4. Aortic valve leaflet calcifications. Echocardiography may be helpful to assess for valvular dysfunction. 5. Chronic prominence of the pulmonary trunk indicating arterial hypertension. 6. Numerous tiny lymph nodes in the mediastinum, less prominent than in 2019. 7. Atrophic native kidneys with increasingly calcified and atrophic right iliac fossa renal transplant. Ureteral stent again extends from this transplant into the bladder. 8. Mildly dilated segments of distal jejunum and ileum with decompression of the remainder of the small bowel. Findings concerning for a low-grade approximately mid ileal small-bowel obstruction potentially due to occult adhesions or occult internal hernia. 9. Diffuse colonic diverticulosis without evidence of diverticulitis. 10. Likely cystitis. No other findings to explain the patient's fever. 11. Renal osteodystrophy. 12. Umbilical and left inguinal fat hernias. Aortic Atherosclerosis (ICD10-I70.0). Electronically Signed   By: Almira Bar M.D.   On: 09/07/2022 04:33   DG Chest Portable 1 View  Result Date: 09/07/2022 CLINICAL DATA:  Cough and fevers EXAM: PORTABLE CHEST 1 VIEW COMPARISON:  12/29/2017 FINDINGS: Cardiac shadow is enlarged but stable. Dialysis catheter is noted in satisfactory position. Lungs are well aerated bilaterally. No focal infiltrate or effusion is seen. No bony abnormality is noted. IMPRESSION: No acute abnormality seen. Electronically Signed   By: Alcide Clever M.D.   On: 09/07/2022 02:42     Labs:  CBC: Recent Labs    09/08/22 1128 09/09/22 0349 09/10/22 0439 09/11/22 0436  WBC 29.8* 27.9* 23.9* 28.5*  HGB 11.8* 11.2* 10.5* 10.9*  HCT 35.9* 32.6* 31.6* 32.3*  PLT PLATELET CLUMPS NOTED ON SMEAR, UNABLE TO ESTIMATE 101* 161 157    COAGS: Recent Labs    09/10/22 1912  INR 1.4*    BMP: Recent Labs    09/08/22 1128 09/09/22 0349 09/10/22 0439 09/11/22 0743  NA 137 129* 130* 129*  K 5.0 4.8 4.6 4.7  CL 87* 85* 89* 84*  CO2 23  20* 23 22  GLUCOSE 133* 127* 134* 134*  BUN 88* 109* 72* 105*  CALCIUM 9.4 9.0 8.5* 8.7*  CREATININE 12.10* 12.26* 8.46* 10.43*  GFRNONAA 4* 4* 7* 5*    LIVER FUNCTION TESTS: Recent Labs    09/07/22 0215 09/08/22 1128  BILITOT 0.6 0.9  AST 53* 82*  ALT 34 58*  ALKPHOS 86 106  PROT 8.3* 8.0  ALBUMIN 3.2* 2.7*    TUMOR MARKERS: No results for input(s): "AFPTM", "CEA", "CA199", "CHROMGRNA" in the last 8760 hours.  Assessment and Plan:  Scheduled for tunneled vs non tunneled dialysis catheter placement in IR 6/21 Wbc 28 today--- likely non tunneled--- will discuss with Interventional Radiologist Risks and benefits discussed with the patient including, but not limited to bleeding, infection, vascular injury, pneumothorax which may require chest tube placement, air embolism or even death All of the patient's questions were answered, patient is agreeable to proceed. Consent signed and in chart.  Thank you for this interesting consult.  I greatly enjoyed meeting Ryan [name] and look forward to participating in their care.  A copy of this report was sent to the requesting provider on this date.  Electronically Signed: Robet Leu, PA-C 09/11/2022, 10:50 AM   I spent a total of 20 Minutes    in face to face in clinical consultation, greater than 50% of which was counseling/coordinating care for tunn vs non tunn HD cath placement

## 2022-09-11 NOTE — Progress Notes (Signed)
As per General surgery PA instruction, will administer Gastografin after patient comes back from TEE.

## 2022-09-11 NOTE — Progress Notes (Signed)
[facility] Progress Note   Assessment/ Plan:   Dialysis Orders:  NW TTS 3:45 450/A1.5x 2K/2Ca EDW 85.7kg TDC  -Heparin 3000 U  -No ESA -Calcitriol 1.5      Assessment/Plan: MSSA bacteremia - 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 ID following. Repeat blood cultures ordered--+ MSSA.  On cefazolin. TDC removed 6/18 -ID recommends 72 hour line holiday. TTE concerning for endocarditis. For TEE. Rpt blood cultures 6/19 -ng to date.  Appreciate ID input. Plan for new cathter Friday.  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 Per primary  ESRD -  HD TTS. Last HD 6/18.  Next HD probably Friday 6/21.  Access -  Had L AVF ligation in 2019. Last seen by Dr. Edilia Bo 01/2022 -limited mobility in R arm so planning for L arm graft but had not scheduled surgery yet.   Hypertension/volume  - BP/volume acceptable. Under dry weight.  Anemia  - Hb 10.5 Not on ESA currently -follow trends  Metabolic bone disease -  Ca acceptable. Check phos --d/c sevelamer today d/t SBO.  Nutrition - Renal diet when eating.     Subjective:    Seen in room. A little more comfortable today, but still with hiccups/cough. Having some loose stools. Ab CT yesterday with SBO. For TEE today    Objective:   BP 112/71 (BP Location: Left Arm)   Pulse 73   Temp (!) 97.5 F (36.4 C) (Oral)   Resp 18   Ht 5\' 9"  (1.753 m)   Wt 81 kg   SpO2 92%   BMI 26.37 kg/m   Physical Exam: [id number] somewhat uncomfortable CVS: RRR Resp: clear Abd: soft Ext: no LE edema ACCESS: None -TDC out   Labs: BMET Recent Labs  Lab 09/07/22 0215 09/08/22 1128 09/09/22 0349 09/10/22 0439 09/11/22 0743  NA 135 137 129* 130* 129*  K 4.1 5.0 4.8 4.6 4.7  CL 91* 87* 85* 89* 84*  CO2 28 23 20* 23 22  GLUCOSE 142* 133* 127* 134* 134*  BUN 43* 88* 109* 72* 105*  CREATININE 8.42* 12.10* 12.26* 8.46* 10.43*  CALCIUM 9.0 9.4 9.0 8.5* 8.7*    CBC Recent Labs  Lab 09/07/22 0215 09/08/22 1128  09/09/22 0349 09/10/22 0439 09/11/22 0436  WBC 23.9* 29.8* 27.9* 23.9* 28.5*  NEUTROABS 21.0*  --   --   --  23.6*  HGB 11.1* 11.8* 11.2* 10.5* 10.9*  HCT 34.7* 35.9* 32.6* 31.6* 32.3*  MCV 87.8 84.1 83.0 82.9 81.6  PLT 154 PLATELET CLUMPS NOTED ON SMEAR, UNABLE TO ESTIMATE 101* 161 157       Medications:     atenolol  50 mg Oral Daily   Chlorhexidine Gluconate Cloth  6 each Topical Daily   cinacalcet  30 mg Oral Q supper   diatrizoate meglumine-sodium  90 mL Oral Once   multivitamin  1 tablet Oral QHS   pantoprazole  40 mg Oral Daily   sevelamer carbonate  1,600 mg Oral TID WC   sodium chloride flush  3 mL Intravenous Q12H   sodium zirconium cyclosilicate  10 g Oral Daily     Colleen Kotlarz Susann Givens PA-C [facility] 09/11/2022,9:55 AM

## 2022-09-11 NOTE — Evaluation (Signed)
Occupational Therapy Evaluation Patient Details Name: Ryan [name] MRN: [id number] DOB: 1960/12/18 Today's Date: 09/11/2022   History of Present Illness Pt is a 62 y/o male admitted secondary to acute back pain and found to have L5-S1 discitis/osteomyelitis, SBO and MSSA bacteremia likely 2/2 line infection. Pt scheduled for TEE 6/20. PMH including but not limited to hypertension, hyperlipidemia, ESRD.   Clinical Impression   At baseline, pt reports he completes ADLs Independent to Mod I and functional transfers/mobility Independently without an AD. However, pt states at baseline, he does not wear socks due to difficulty donning/doffing them and socks feet instead of washing secondary to pain in leaning forward to wash feet. Pt had impaired B UE shoulder ROM at baseline. Pt now presents with decreased activity tolerance, decreased balance during functional tasks, generalized B UE weakness, increased pain in back, and decreased safety and independence with ADLs and functional transfers/mobility. Pt currently demonstrates ability to complete UB ADLs Independent to Mod assist, LB ADLs with Mod to Max assist, bed mobility in preparation for functional tasks with Supervision to Mod assist, and functional transfers with a RW (2 wheel) with Min assist. Pt will benefit from acute skilled OT services to address deficits outlined below and to increase safety and independence with bed mobility in preparation for functional tasks, functional transfers, functional mobility, and ADLs, including training in compensatory strategies and use of AE. Post acute discharge, pt will benefit from continued skilled OT services in the home to maximize rehab potential.      Recommendations for follow up therapy are one component of a multi-disciplinary discharge planning process, led by the attending physician.  Recommendations may be updated based on patient status, additional functional criteria and insurance  authorization.   Assistance Recommended at Discharge Frequent or constant Supervision/Assistance  Patient can return home with the following A little help with walking and/or transfers;A lot of help with bathing/dressing/bathroom;Assistance with cooking/housework;Assist for transportation;Help with stairs or ramp for entrance    Functional Status Assessment  Patient has had a recent decline in their functional status and demonstrates the ability to make significant improvements in function in a reasonable and predictable amount of time.  Equipment Recommendations  Tub/shower seat;Toilet rise with handles    Recommendations for Other Services       Precautions / Restrictions Precautions Precautions: Fall Restrictions Weight Bearing Restrictions: No      Mobility Bed Mobility Overal bed mobility: Needs Assistance Bed Mobility: Rolling, Sidelying to Sit, Sit to Supine Rolling: Supervision Sidelying to sit: Mod assist   Sit to supine: Mod assist   General bed mobility comments: increased time and effort, cueing for log roll technique for comfort, use of bed rails, mod A for trunk elevation to achieve an upright sitting position at EOB. Mod assist to return bilateral LEs onto bed this session.    Transfers Overall transfer level: Needs assistance Equipment used: Rolling walker (2 wheels) Transfers: Sit to/from Stand, Bed to chair/wheelchair/BSC Sit to Stand: Min assist     Step pivot transfers: Min assist     General transfer comment: increased time and effort, cueing for safe hand placement, min A to fully power up into standing and for stability      Balance Overall balance assessment: Needs assistance Sitting-balance support: Feet supported, Single extremity supported, No upper extremity supported Sitting balance-Leahy Scale: Fair     Standing balance support: Bilateral upper extremity supported, During functional activity, Reliant on assistive device for  balance Standing balance-Leahy Scale: Poor  ADL either performed or assessed with clinical judgement   ADL Overall ADL's : Needs assistance/impaired Eating/Feeding: Independent;Bed level   Grooming: Modified independent;Bed level   Upper Body Bathing: Moderate assistance;Sitting;Cueing for compensatory techniques   Lower Body Bathing: Maximal assistance;Sit to/from stand;Cueing for compensatory techniques   Upper Body Dressing : Moderate assistance;Sitting;Cueing for compensatory techniques   Lower Body Dressing: Maximal assistance;Sit to/from stand;Cueing for compensatory techniques   Toilet Transfer: Minimal assistance;BSC/3in1;Rolling walker (2 wheels) (Step-pivot transfer)   Toileting- Clothing Manipulation and Hygiene: Moderate assistance;Cueing for compensatory techniques;Sitting/lateral lean;Sit to/from stand         General ADL Comments: Pt presents with decreased activity tolerance and decreased sitting tolerance during functional tasks secondary to back pain. Pt will benefit from training in use of compensatory strategies and AE for ADLs to increase safety and independence and decrease pain during tasks. Functional mobility defered this session secondary to pt pain level.     Vision Baseline Vision/History: 1 Wears glasses Ability to See in Adequate Light: 1 Impaired Patient Visual Report: No change from baseline       Perception     Praxis Praxis Praxis tested?: Within functional limits    Pertinent Vitals/Pain Pain Assessment Pain Assessment: Faces Faces Pain Scale: Hurts even more Pain Location: back (with increased pain during movement and in sitting and standing) Pain Descriptors / Indicators: Discomfort, Aching, Grimacing, Guarding, Heaviness Pain Intervention(s): Limited activity within patient's tolerance, Monitored during session, Repositioned, Premedicated before session, Other (comment) (Attempted to see pt for  skilled OT eval earlier in the day. However, at that time pt reported pain 8/10. OT notified RN who provided pt with pain medication. OT returned to complete evaluation approx. an hour later.)     Hand Dominance Right   Extremity/Trunk Assessment Upper Extremity Assessment Upper Extremity Assessment: Generalized weakness;RUE deficits/detail;LUE deficits/detail RUE Deficits / Details: Pt reports hx of R rotator cuff tear with subsequent weakness and decreased AROM/PROM in Right shoulder, particularly with shoulder flex/abduction. Pt presents with 2/5 MMT with limited AROM in Right shoulder and with PROM shoulder flection to approx 100 degrees. RUE Sensation: WNL RUE Coordination: WNL LUE Deficits / Details: Pt presents with general L UE MMT 3-/5 and AROM shoulder flextion to approx. 105 degrees. LUE Sensation: WNL LUE Coordination: WNL   Lower Extremity Assessment Lower Extremity Assessment: Defer to PT evaluation       Communication Communication Communication: No difficulties   Cognition Arousal/Alertness: Lethargic Behavior During Therapy: WFL for tasks assessed/performed Overall Cognitive Status: Within Functional Limits for tasks assessed                                 General Comments: Pt with increased fatigue and lethergy this session. Pt presented with significantly increased alertness once sitting EOB.     General Comments  Pt participated well in session. Pt VSS on RA throughout session.    Exercises     Shoulder Instructions      Home Living Family/patient expects to be discharged to:: Private residence Living Arrangements: Alone Available Help at Discharge: Family;Available 24 hours/day Type of Home: House Home Access: Stairs to enter [employer] of Steps: 1   Home Layout: Two level;1/2 bath on main level Alternate Level Stairs-Number of Steps: flight   Bathroom Shower/Tub: Walk-in shower         Home Equipment: None    Additional Comments: has already made plans to stay on the  main level until he makes a full recovery      Prior Functioning/Environment Prior Level of Function : Independent/Modified Independent;Driving             Mobility Comments: Previously Independent without an AD ADLs Comments: Previously Independent to Mod I with pt stating he does not wear socks due to difficulty donning/doffing them and socks feet instead of washing secondary to pain in leaning forward to wash feet.        OT Problem List: Decreased strength;Decreased range of motion;Decreased activity tolerance;Impaired balance (sitting and/or standing);Decreased knowledge of use of DME or AE;Impaired UE functional use      OT Treatment/Interventions: Self-care/ADL training;Therapeutic exercise;Energy conservation;DME and/or AE instruction;Therapeutic activities;Patient/family education;Balance training    OT Goals(Current goals can be found in the care plan section) Acute Rehab OT Goals Patient Stated Goal: To return home and have less back pain OT Goal Formulation: With patient Time For Goal Achievement: 09/25/22 Potential to Achieve Goals: Good ADL Goals Pt Will Perform Lower Body Bathing: with supervision;with adaptive equipment;sit to/from stand;sitting/lateral leans Pt Will Perform Upper Body Dressing: with modified independence;sitting (using compensatory strategies) Pt Will Perform Lower Body Dressing: with adaptive equipment;sit to/from stand;with supervision Pt Will Perform Toileting - Clothing Manipulation and hygiene: with modified independence;sit to/from stand (using adaptive equipment and compensatory strategies as needed) Pt/caregiver will Perform Home Exercise Program: Increased strength;Both right and left upper extremity;Independently;With written HEP provided;With theraputty (with active range of motion) Additional ADL Goal #1: Patient will demonstrate the ability to Independently state 3 energy  conservation techniques to increase safety and independence with ADLs and functional mobility in the home.  OT Frequency: Min 2X/week    Co-evaluation              AM-PAC OT "6 Clicks" Daily Activity     Outcome Measure Help from another person eating meals?: None Help from another person taking care of personal grooming?: A Little Help from another person toileting, which includes using toliet, bedpan, or urinal?: A Lot Help from another person bathing (including washing, rinsing, drying)?: A Lot Help from another person to put on and taking off regular upper body clothing?: A Lot Help from another person to put on and taking off regular lower body clothing?: A Lot 6 Click Score: 15   End of Session Equipment Utilized During Treatment: Gait belt;Rolling walker (2 wheels);Other (comment) [facility]) Nurse Communication: Mobility status;Other (comment) (Pain)  Activity Tolerance: Patient limited by fatigue;Patient limited by pain;Patient limited by lethargy Patient left: in bed;with call bell/phone within reach;with bed alarm set  OT Visit Diagnosis: Unsteadiness on feet (R26.81);Muscle weakness (generalized) (M62.81);Pain                Time: 5409-8119 OT Time Calculation (min): 33 min Charges:  OT General Charges $OT Visit: 1 Visit OT Evaluation $OT Eval Low Complexity: 1 Low OT Treatments $Self Care/Home Management : 8-22 mins  Manya Balash "Orson Eva., OTR/L, MA Acute Rehab [phone]   Lendon Colonel 09/11/2022, 5:19 PM

## 2022-09-11 NOTE — Progress Notes (Signed)
PROGRESS NOTE    ADHVAITH THOUSAND  UEA:[id number] DOB: 08/28/1960 DOA: 09/07/2022 PCP: Sharin Grave, MD   Brief Narrative: Ryan [name] is a 62 y.o. male with a history of hypertension, hyperlipidemia, ESRD on hemodialysis.  Patient presented secondary to low back pain and was found to have evidence of L5-S1 discitis/osteomyelitis in addition to MSSA bacteremia.  ID consulted.  Workup significant for possible endocarditis. Patient also found to have a small bowel obstruction.   Assessment/Plan:  L5-S1 discitis/osteomyelitis MSSA bacteremia Confirmed possible early discitis/osteomyelitis on MRI imaging.  Blood cultures positive for MSSA.  Patient was empirically treated with vancomycin and cefepime.  Infectious disease consulted.  Patient transition to cefazolin IV with hemodialysis.  Transthoracic echocardiogram significant for a large, rounded structure on the anterior leaflet of the mitral valve concerning for endocarditis.  ID recommending transesophageal echocardiogram; cardiology consulted. Leukocytosis worsened today. -Infectious disease recommendations: Transesophageal Echocardiogram (planned for 6/20)  ESRD on hemodialysis Nephrology consulted.  Patient presented with a tunneled dialysis catheter which was removed on 6/18 secondary to bacteremia.  Per ID, patient requires a 72-hour line holiday.  Patient last received hemodialysis on 6/18.  Intractable hiccups Patient started on Thorazine as needed.  Improved today.  Small bowel obstruction Patient without a bowel movement for several days.  Diagnosis confirmed on CT scan with transition point located in the right anterior abdomen.  General surgery consulted. -General surgery recommendations: pending -Limit narcotics  Primary hypertension -Continue atenolol  Elevated troponin Previously noted. No associated chest pain. Presumed secondary to demand ischemia.  Lung nodules Noted on initial CT scan.  Initial  recommendation for repeat CT scan of the chest in 18 to 24 months.  Repeat CT scan on 6/20 showed multiple new pulmonary nodules compatible with likely active infection and/or aspiration.  Patient without symptoms to suggest pneumonia although it is likely these nodules are septic in etiology.  Bladder wall thickening Noted incidentally on CT imaging.  No urinary symptoms and patient does not make urine secondary to ESRD. Unclear if this correlates with isolated infection or is related to overall infectious picture.   DVT prophylaxis: SCDs Code Status:   Code Status: Full Code Family Communication: None at bedside Disposition Plan: Discharge home pending completion of endocarditis workup, and outpatient antibiotic regimen recommendations   Consultants:  Infectious disease Nephrology Interventional radiology  Procedures:  Hemodialysis 6/18: Successful removal of tunneled hemodialysis catheter   Antimicrobials: Vancomycin IV Flagyl IV Cefepime IV Cefazolin IV   Subjective: Hiccups improved. Bowel movement overnight. No flatus. Patient reports refluxing some of the contrast but no further nausea/vomiting. Some mild right sided abdominal pain.  Objective: BP (!) 104/56 (BP Location: Left Arm)   Pulse 67   Temp 98 F (36.7 C) (Oral)   Resp 18   Ht 5\' 9"  (1.753 m)   Wt 81 kg   SpO2 95%   BMI 26.37 kg/m   Examination:  General exam: Appears calm and comfortable Respiratory system: Clear to auscultation. Respiratory effort normal. Cardiovascular system: S1 & S2 heard, RRR. Gastrointestinal system: Abdomen is distended, soft and mild-moderately tender in right quadrant. Slightly hyperactive bowel sounds heard. Central nervous system: Alert and oriented. No focal neurological deficits. Musculoskeletal: No edema. No calf tenderness Skin: No cyanosis. No rashes Psychiatry: Judgement and insight appear normal. Mood & affect appropriate.    Data Reviewed: I have personally  reviewed following labs and imaging studies   Last CBC Lab Results  Component Value Date   WBC 28.5 (H)  09/11/2022   HGB 10.9 (L) 09/11/2022   HCT 32.3 (L) 09/11/2022   MCV 81.6 09/11/2022   MCH 27.5 09/11/2022   RDW 14.7 09/11/2022   PLT 157 09/11/2022     Last metabolic panel Lab Results  Component Value Date   GLUCOSE 134 (H) 09/10/2022   NA 130 (L) 09/10/2022   K 4.6 09/10/2022   CL 89 (L) 09/10/2022   CO2 23 09/10/2022   BUN 72 (H) 09/10/2022   CREATININE 8.46 (H) 09/10/2022   GFRNONAA 7 (L) 09/10/2022   CALCIUM 8.5 (L) 09/10/2022   PHOS 3.0 03/12/2019   PROT 8.0 09/08/2022   ALBUMIN 2.7 (L) 09/08/2022   BILITOT 0.9 09/08/2022   ALKPHOS 106 09/08/2022   AST 82 (H) 09/08/2022   ALT 58 (H) 09/08/2022   ANIONGAP 18 (H) 09/10/2022     Creatinine Clearance: Estimated Creatinine Clearance: 9.2 mL/min (A) (by C-G formula based on SCr of 8.46 mg/dL (H)).  Recent Results (from the past 240 hour(s))  Blood culture (routine x 2)     Status: Abnormal   Collection Time: 09/07/22  2:15 AM   Specimen: BLOOD  Result Value Ref Range Status   Specimen Description   Final    BLOOD LEFT ANTECUBITAL Performed at [facility], [address], [place], [place] [address]    Special Requests   Final    BOTTLES DRAWN AEROBIC AND ANAEROBIC Blood Culture adequate volume Performed at [facility], [address], [place], [place] [address]    Culture  Setup Time   Final    GRAM POSITIVE COCCI IN BOTH AEROBIC AND ANAEROBIC BOTTLES CRITICAL VALUE NOTED.  VALUE IS CONSISTENT WITH PREVIOUSLY REPORTED AND CALLED VALUE.    Culture (A)  Final    STAPHYLOCOCCUS AUREUS SUSCEPTIBILITIES PERFORMED ON PREVIOUS CULTURE WITHIN THE LAST 5 DAYS. Performed at [facility], [address] [place], [place] [address]    Report Status 09/09/2022 FINAL  Final  Blood culture (routine x 2)     Status: Abnormal   Collection Time: 09/07/22  2:51 AM    Specimen: BLOOD  Result Value Ref Range Status   Specimen Description   Final    BLOOD BLOOD RIGHT HAND Performed at [facility], [address] [place], [place] [address]    Special Requests   Final    BOTTLES DRAWN AEROBIC AND ANAEROBIC Blood Culture adequate volume Performed at [facility], [address], [place], [place] [address]    Culture  Setup Time   Final    GRAM POSITIVE COCCI IN BOTH AEROBIC AND ANAEROBIC BOTTLES CRITICAL RESULT CALLED TO, READ BACK BY AND VERIFIED WITH: PHARMD NICK GLOGOVAC [id number] 1503 BY EC Performed at [facility], [address], [place], [place] [address]    Culture STAPHYLOCOCCUS AUREUS (A)  Final   Report Status 09/09/2022 FINAL  Final   Organism ID, Bacteria STAPHYLOCOCCUS AUREUS  Final      Susceptibility   Staphylococcus aureus - MIC*    CIPROFLOXACIN <=0.5 SENSITIVE Sensitive     ERYTHROMYCIN <=0.25 SENSITIVE Sensitive     GENTAMICIN <=0.5 SENSITIVE Sensitive     OXACILLIN 0.5 SENSITIVE Sensitive     TETRACYCLINE <=1 SENSITIVE Sensitive     VANCOMYCIN 1 SENSITIVE Sensitive     TRIMETH/SULFA <=10 SENSITIVE Sensitive     CLINDAMYCIN <=0.25 SENSITIVE Sensitive     RIFAMPIN <=0.5 SENSITIVE Sensitive     Inducible Clindamycin NEGATIVE Sensitive  LINEZOLID 2 SENSITIVE Sensitive     * STAPHYLOCOCCUS AUREUS  Blood Culture ID Panel (Reflexed)     Status: Abnormal   Collection Time: 09/07/22  2:51 AM  Result Value Ref Range Status   Enterococcus faecalis NOT DETECTED NOT DETECTED Final   Enterococcus Faecium NOT DETECTED NOT DETECTED Final   Listeria monocytogenes NOT DETECTED NOT DETECTED Final   Staphylococcus species DETECTED (A) NOT DETECTED Final    Comment: CRITICAL RESULT CALLED TO, READ BACK BY AND VERIFIED WITH: PHARMD NICK GLOGOVAC [id number] AT 1503 BY EC    Staphylococcus aureus (BCID) DETECTED (A) NOT DETECTED Final    Comment: CRITICAL RESULT CALLED TO, READ BACK BY AND VERIFIED  WITH: PHARMD NICK GLOGOVAC [id number] AT 1503 BY EC    Staphylococcus epidermidis NOT DETECTED NOT DETECTED Final   Staphylococcus lugdunensis NOT DETECTED NOT DETECTED Final   Streptococcus species NOT DETECTED NOT DETECTED Final   Streptococcus agalactiae NOT DETECTED NOT DETECTED Final   Streptococcus pneumoniae NOT DETECTED NOT DETECTED Final   Streptococcus pyogenes NOT DETECTED NOT DETECTED Final   A.calcoaceticus-baumannii NOT DETECTED NOT DETECTED Final   Bacteroides fragilis NOT DETECTED NOT DETECTED Final   Enterobacterales NOT DETECTED NOT DETECTED Final   Enterobacter cloacae complex NOT DETECTED NOT DETECTED Final   Escherichia coli NOT DETECTED NOT DETECTED Final   Klebsiella aerogenes NOT DETECTED NOT DETECTED Final   Klebsiella oxytoca NOT DETECTED NOT DETECTED Final   Klebsiella pneumoniae NOT DETECTED NOT DETECTED Final   Proteus species NOT DETECTED NOT DETECTED Final   Salmonella species NOT DETECTED NOT DETECTED Final   Serratia marcescens NOT DETECTED NOT DETECTED Final   Haemophilus influenzae NOT DETECTED NOT DETECTED Final   Neisseria meningitidis NOT DETECTED NOT DETECTED Final   Pseudomonas aeruginosa NOT DETECTED NOT DETECTED Final   Stenotrophomonas maltophilia NOT DETECTED NOT DETECTED Final   Candida albicans NOT DETECTED NOT DETECTED Final   Candida auris NOT DETECTED NOT DETECTED Final   Candida glabrata NOT DETECTED NOT DETECTED Final   Candida krusei NOT DETECTED NOT DETECTED Final   Candida parapsilosis NOT DETECTED NOT DETECTED Final   Candida tropicalis NOT DETECTED NOT DETECTED Final   Cryptococcus neoformans/gattii NOT DETECTED NOT DETECTED Final   Meth resistant mecA/C and MREJ NOT DETECTED NOT DETECTED Final    Comment: Performed at [facility], [address], [place], [place] [address]  Respiratory (~20 pathogens) panel by PCR     Status: None   Collection Time: 09/07/22  5:45 AM   Specimen: Anterior Nasal Swab; Respiratory  Result  Value Ref Range Status   Adenovirus NOT DETECTED NOT DETECTED Final   Coronavirus 229E NOT DETECTED NOT DETECTED Final    Comment: (NOTE) The Coronavirus on the Respiratory Panel, DOES NOT test for the novel  Coronavirus (2019 nCoV)    Coronavirus HKU1 NOT DETECTED NOT DETECTED Final   Coronavirus NL63 NOT DETECTED NOT DETECTED Final   Coronavirus OC43 NOT DETECTED NOT DETECTED Final   Metapneumovirus NOT DETECTED NOT DETECTED Final   Rhinovirus / Enterovirus NOT DETECTED NOT DETECTED Final   Influenza A NOT DETECTED NOT DETECTED Final   Influenza B NOT DETECTED NOT DETECTED Final   Parainfluenza Virus 1 NOT DETECTED NOT DETECTED Final   Parainfluenza Virus 2 NOT DETECTED NOT DETECTED Final   Parainfluenza Virus 3 NOT DETECTED NOT DETECTED Final   Parainfluenza Virus 4 NOT DETECTED NOT DETECTED Final   Respiratory Syncytial Virus NOT DETECTED NOT DETECTED Final   Bordetella  pertussis NOT DETECTED NOT DETECTED Final   Bordetella Parapertussis NOT DETECTED NOT DETECTED Final   Chlamydophila pneumoniae NOT DETECTED NOT DETECTED Final   Mycoplasma pneumoniae NOT DETECTED NOT DETECTED Final    Comment: Performed at [facility], [address] [place], [place] [address]  SARS Coronavirus 2 by RT PCR (hospital order, performed in [facility] hospital lab) *cepheid single result test* Anterior Nasal Swab     Status: None   Collection Time: 09/07/22  5:45 AM   Specimen: Anterior Nasal Swab  Result Value Ref Range Status   SARS Coronavirus 2 by RT PCR NEGATIVE NEGATIVE Final    Comment: (NOTE) SARS-CoV-2 target nucleic acids are NOT DETECTED.  The SARS-CoV-2 RNA is generally detectable in upper and lower respiratory specimens during the acute phase of infection. The lowest concentration of SARS-CoV-2 viral copies this assay can detect is 250 copies / mL. A negative result does not preclude SARS-CoV-2 infection and should not be used as the sole basis for treatment or other patient  management decisions.  A negative result may occur with improper specimen collection / handling, submission of specimen other than nasopharyngeal swab, presence of viral mutation(s) within the areas targeted by this assay, and inadequate number of viral copies (<250 copies / mL). A negative result must be combined with clinical observations, patient history, and epidemiological information.  Fact Sheet for Patients:   RoadLapTop.co.za  Fact Sheet for Healthcare Providers: http://kim-miller.com/  This test is not yet approved or  cleared by the [place] FDA and has been authorized for detection and/or diagnosis of SARS-CoV-2 by FDA under an Emergency Use Authorization (EUA).  This EUA will remain in effect (meaning this test can be used) for the duration of the COVID-19 declaration under Section 564(b)(1) of the Act, 21 U.S.C. section 360bbb-3(b)(1), unless the authorization is terminated or revoked sooner.  Performed at [facility], [address] [place], [place] [address]   Culture, blood (Routine X 2) w Reflex to ID Panel     Status: None (Preliminary result)   Collection Time: 09/08/22 11:28 AM   Specimen: BLOOD LEFT HAND  Result Value Ref Range Status   Specimen Description BLOOD LEFT HAND  Final   Special Requests   Final    BOTTLES DRAWN AEROBIC AND ANAEROBIC Blood Culture adequate volume   Culture   Final    NO GROWTH 3 DAYS Performed at [facility], [address] [place], [place] [address]    Report Status PENDING  Incomplete  Culture, blood (Routine X 2) w Reflex to ID Panel     Status: None (Preliminary result)   Collection Time: 09/08/22 11:28 AM   Specimen: BLOOD RIGHT HAND  Result Value Ref Range Status   Specimen Description BLOOD RIGHT HAND  Final   Special Requests   Final    BOTTLES DRAWN AEROBIC AND ANAEROBIC Blood Culture adequate volume   Culture   Final    NO GROWTH 3  DAYS Performed at [facility], [address] [place], [place] [address]    Report Status PENDING  Incomplete  Culture, blood (Routine X 2) w Reflex to ID Panel     Status: None (Preliminary result)   Collection Time: 09/10/22  3:16 PM   Specimen: BLOOD  Result Value Ref Range Status   Specimen Description BLOOD RIGHT ANTECUBITAL  Final   Special Requests   Final    BOTTLES DRAWN AEROBIC AND ANAEROBIC Blood Culture  adequate volume   Culture   Final    NO GROWTH < 24 HOURS Performed at [facility], [address] [place], [place] [address]    Report Status PENDING  Incomplete  Culture, blood (Routine X 2) w Reflex to ID Panel     Status: None (Preliminary result)   Collection Time: 09/10/22  3:16 PM   Specimen: BLOOD LEFT ARM  Result Value Ref Range Status   Specimen Description BLOOD LEFT ARM  Final   Special Requests   Final    BOTTLES DRAWN AEROBIC AND ANAEROBIC Blood Culture adequate volume   Culture   Final    NO GROWTH < 24 HOURS Performed at [facility], [address] [place], [place] [address]    Report Status PENDING  Incomplete      Radiology Studies: DG Abd 1 View  Result Date: 09/11/2022 CLINICAL DATA:  Small-bowel obstruction EXAM: ABDOMEN - 1 VIEW COMPARISON:  KUB and CT abdomen/pelvis 1 day prior FINDINGS: Gaseous distention of small bowel in the midabdomen measuring up to 5.2 cm is not significantly changed. There is no definite free intraperitoneal air, within the confines of supine technique. There is no abnormal soft tissue calcification. A ureteral stent is noted in the right pelvis. There is no acute osseous abnormality. IMPRESSION: Similar gaseous distention of the small bowel compared to the study from 1 day prior. Electronically Signed   By: Lesia Hausen M.D.   On: 09/11/2022 08:19   CT ABDOMEN PELVIS WO CONTRAST  Result Date: 09/10/2022 CLINICAL DATA:  Bowel obstruction suspected EXAM: CT ABDOMEN AND PELVIS WITHOUT CONTRAST  TECHNIQUE: Multidetector CT imaging of the abdomen and pelvis was performed following the standard protocol without IV contrast. RADIATION DOSE REDUCTION: This exam was performed according to the departmental dose-optimization program which includes automated exposure control, adjustment of the mA and/or kV according to patient size and/or use of iterative reconstruction technique. COMPARISON:  Abdominal radiographs 09/10/2022 and CT chest abdomen and pelvis 09/07/2022 FINDINGS: Lower chest: Multiple new pulmonary nodules in the right lower lobe measuring up to 17 mm which were not present on 09/07/2022. Given rapid development these are infectious/inflammatory. The previously seen 6 mm nodule in the left lower lobe has resolved. Hepatobiliary: Mild hepatic steatosis. Decompressed gallbladder. No biliary dilation. Pancreas: Unremarkable. Spleen: Unremarkable. Adrenals/Urinary Tract: Normal adrenal glands. Atrophic native kidneys. No urinary calculi or hydronephrosis. 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 A ureteral stent again extends from the transplant kidney into the bladder. Irregular bladder wall thickening. Stomach/Bowel: Diffuse dilation of the small bowel in the anterior abdomen and pelvis there is abrupt transition point in the right anterior abdomen (series 6/image 37 and series 3/image 55) in an area of mild wall thickening. Enteric contrast is present within the small bowel reaching a point in the left lower quadrant where there is moderate dilation and wall thickening of the small bowel (circa series 3/image 64). Stomach is within normal limits. Normal caliber colon. Colonic diverticulosis without diverticulitis. Normal appendix. Vascular/Lymphatic: Aortic atherosclerosis. No enlarged abdominal or pelvic lymph nodes. Reproductive: Unremarkable. Other: No free intraperitoneal fluid or air. Fat containing left inguinal hernia. Musculoskeletal: No  acute osseous abnormality or destructive osseous lesion. Dense bones consistent with renal osteodystrophy. Lucency in the L2 vertebral body are stable since 2020 consistent with Schmorl's nodes or changes of hyperparathyroidism. IMPRESSION: 1. Small-bowel obstruction with transition point in the right anterior abdomen in an area of mild wall  thickening. 2. Enteric contrast is present within the small bowel reaching a point in the left lower quadrant where there is moderate dilation and apparent wall thickening. Wall thickening may be due to infectious or inflammatory enteritis however neoplastic infiltration is not excluded. Continued attention on follow-up. 3. Multiple new pulmonary nodules in the right lower lobe measuring up to 17 mm which were not present on 09/07/2022. Given rapid development these are compatible with infection and/or aspiration. 4. Irregular bladder wall thickening. Recommend correlation with urinalysis. Aortic Atherosclerosis (ICD10-I70.0). Electronically Signed   By: Minerva Fester M.D.   On: 09/10/2022 19:56   DG Abd Portable 1V  Result Date: 09/10/2022 CLINICAL DATA:  Abdominal distension. EXAM: PORTABLE ABDOMEN - 1 VIEW COMPARISON:  August 26, 2016. FINDINGS: Small bowel dilatation is noted concerning for distal small bowel obstruction. No colonic dilatation is noted. Right-sided ureteral stent is again noted. IMPRESSION: Small bowel dilatation is noted concerning for distal small bowel obstruction or possibly ileus. Electronically Signed   By: Lupita Raider M.D.   On: 09/10/2022 14:26   IR Removal Tun Cv Cath W/O FL  Result Date: 09/09/2022 INDICATION: 62 year old male with history of ESRD and tunneled hemodialysis catheter in place presents with bacteremia. Request for tunneled dialysis catheter removal. EXAM: REMOVAL OF TUNNELED HEMODIALYSIS CATHETER MEDICATIONS: 10 mL 1 % lidocaine COMPLICATIONS: None immediate. PROCEDURE: Informed written consent was obtained from the patient  following an explanation of the procedure, risks, benefits and alternatives to treatment. A time out was performed prior to the initiation of the procedure. Sterile technique was utilized including mask, sterile gloves, sterile drape, and hand hygiene. ChloraPrep was used to prep the patient's right neck, chest and existing catheter. 1% lidocaine was injected around the catheter and the subcutaneous tunnel. The catheter was dissected out using scissors and curved hemostats until the cuff was freed from the surrounding fibrous sheath. The catheter was removed intact. Hemostasis was obtained with manual compression. A dressing was placed. The patient tolerated the procedure well without immediate post procedural complication. IMPRESSION: Successful removal of tunneled dialysis catheter. Performed by: Lawernce Ion, PA-C Electronically Signed   By: Irish Lack M.D.   On: 09/09/2022 13:32      LOS: 4 days    Jacquelin Hawking, MD [facility] 09/11/2022, 8:27 AM   If 7PM-7AM, please contact night-coverage [email or web address]

## 2022-09-12 ENCOUNTER — Inpatient Hospital Stay (HOSPITAL_COMMUNITY): Payer: No Typology Code available for payment source

## 2022-09-12 DIAGNOSIS — N186 End stage renal disease: Secondary | ICD-10-CM | POA: Diagnosis not present

## 2022-09-12 DIAGNOSIS — M545 Low back pain, unspecified: Secondary | ICD-10-CM | POA: Diagnosis not present

## 2022-09-12 DIAGNOSIS — I339 Acute and subacute endocarditis, unspecified: Secondary | ICD-10-CM | POA: Diagnosis not present

## 2022-09-12 DIAGNOSIS — J3489 Other specified disorders of nose and nasal sinuses: Secondary | ICD-10-CM | POA: Diagnosis not present

## 2022-09-12 DIAGNOSIS — I350 Nonrheumatic aortic (valve) stenosis: Secondary | ICD-10-CM | POA: Diagnosis not present

## 2022-09-12 DIAGNOSIS — B9561 Methicillin susceptible Staphylococcus aureus infection as the cause of diseases classified elsewhere: Secondary | ICD-10-CM | POA: Diagnosis not present

## 2022-09-12 DIAGNOSIS — M4647 Discitis, unspecified, lumbosacral region: Secondary | ICD-10-CM | POA: Diagnosis not present

## 2022-09-12 DIAGNOSIS — Z4901 Encounter for fitting and adjustment of extracorporeal dialysis catheter: Secondary | ICD-10-CM | POA: Diagnosis not present

## 2022-09-12 HISTORY — PX: IR US GUIDE VASC ACCESS RIGHT: IMG2390

## 2022-09-12 HISTORY — PX: IR FLUORO GUIDE CV LINE RIGHT: IMG2283

## 2022-09-12 LAB — RENAL FUNCTION PANEL
Albumin: 2 g/dL — ABNORMAL LOW (ref 3.5–5.0)
Anion gap: 25 — ABNORMAL HIGH (ref 5–15)
BUN: 135 mg/dL — ABNORMAL HIGH (ref 8–23)
CO2: 18 mmol/L — ABNORMAL LOW (ref 22–32)
Calcium: 8.6 mg/dL — ABNORMAL LOW (ref 8.9–10.3)
Chloride: 86 mmol/L — ABNORMAL LOW (ref 98–111)
Creatinine, Ser: 13.36 mg/dL — ABNORMAL HIGH (ref 0.61–1.24)
GFR, Estimated: 4 mL/min — ABNORMAL LOW (ref >60)
Glucose, Bld: 119 mg/dL — ABNORMAL HIGH (ref 70–99)
Phosphorus: 8.2 mg/dL — ABNORMAL HIGH (ref 2.5–4.6)
Potassium: 5.4 mmol/L — ABNORMAL HIGH (ref 3.5–5.1)
Sodium: 129 mmol/L — ABNORMAL LOW (ref 135–145)

## 2022-09-12 LAB — CBC
HCT: 29.7 % — ABNORMAL LOW (ref 39.0–52.0)
Hemoglobin: 10.2 g/dL — ABNORMAL LOW (ref 13.0–17.0)
MCH: 28 pg (ref 26.0–34.0)
MCHC: 34.3 g/dL (ref 30.0–36.0)
MCV: 81.6 fL (ref 80.0–100.0)
Platelets: 230 10*3/uL (ref 150–400)
RBC: 3.64 MIL/uL — ABNORMAL LOW (ref 4.22–5.81)
RDW: 15 % (ref 11.5–15.5)
WBC: 27.5 10*3/uL — ABNORMAL HIGH (ref 4.0–10.5)
nRBC: 0.1 % (ref 0.0–0.2)

## 2022-09-12 LAB — CULTURE, BLOOD (ROUTINE X 2)
Culture: NO GROWTH
Culture: NO GROWTH
Special Requests: ADEQUATE

## 2022-09-12 MED ORDER — HYDROMORPHONE HCL 1 MG/ML IJ SOLN
INTRAMUSCULAR | Status: AC
  Filled 2022-09-12: qty 1

## 2022-09-12 MED ORDER — HEPARIN SODIUM (PORCINE) 1000 UNIT/ML IJ SOLN
INTRAMUSCULAR | Status: AC
  Administered 2022-09-12: 1000 [IU]
  Filled 2022-09-12: qty 3

## 2022-09-12 MED ORDER — LIDOCAINE HCL 1 % IJ SOLN
INTRAMUSCULAR | Status: AC
  Filled 2022-09-12: qty 20

## 2022-09-12 MED ORDER — LIDOCAINE HCL 1 % IJ SOLN
10.0000 mL | Freq: Once | INTRAMUSCULAR | Status: AC
  Administered 2022-09-12: 10 mL

## 2022-09-12 MED ORDER — HEPARIN SODIUM (PORCINE) 1000 UNIT/ML IJ SOLN
INTRAMUSCULAR | Status: AC
  Filled 2022-09-12: qty 4

## 2022-09-12 MED ORDER — HEPARIN SODIUM (PORCINE) 1000 UNIT/ML IJ SOLN
INTRAMUSCULAR | Status: AC
  Filled 2022-09-12: qty 10

## 2022-09-12 NOTE — Procedures (Signed)
Pre-procedure Diagnosis: ESRD; Bacteremia Post-procedure Diagnosis: Same  Attempted placement of right jugular HD catheter demonstrates apparent occlusion of the SVC, incompletely evaluated d/t inability to administer contrast d/t pt's h/o contrast allergy and un-premedicated state.  Successful placement of a non-tunneled HD catheter via the right CFV with tips terminating within the inferior aspect of the IVC.  Complications: None Immediate EBL: Trace  The catheter is ready for immediate use.  PLAN: Once pt's bacteremia clears, would recommend coordinating with the IR service for sterioid prep prior to attempted left internal jugular approach HD catheter placement versus conversion of right CFV temp catheter to tunneled HD catheter.    Katherina Right, MD Pager #: [phone]

## 2022-09-12 NOTE — Progress Notes (Cosign Needed)
    Durable Medical Equipment  (From admission, onward)           Start     Ordered   09/12/22 1119  For home use only DME Bedside commode  Once       Comments: Pt's generalized weakness and decreased activity tolerance necessitate recommendation for bedside commode as he is not able to ambulate to the bathroom.  Question:  Patient needs a bedside commode to treat with the following condition  Answer:  Weakness   09/12/22 1119   09/12/22 1117  For home use only DME Walker rolling  Once       Question Answer Comment  Walker: With 5 Inch Wheels   Patient needs a walker to treat with the following condition Gait instability      09/12/22 1119

## 2022-09-12 NOTE — Progress Notes (Signed)
PT Cancellation Note  Patient Details Name: Ryan [name] MRN: [id number] DOB: [birth year]   Cancelled Treatment:    Reason Eval/Treat Not Completed: Medical issues which prohibited therapy; noted pt with new femoral temporary HD cath and RN reports going to HD soon.  Will cancel for today.  Will need clarification for mobility for next session, messaged MD on secure chat.    Elray Mcgregor 09/12/2022, 2:08 PM Sheran Lawless, PT Acute Rehabilitation Services Office:[phone] 09/12/2022

## 2022-09-12 NOTE — Progress Notes (Signed)
[facility] Progress Note   Assessment/ Plan:   Dialysis Orders:  [facility] TTS 3:45 450/A1.5x 2K/2Ca EDW 85.7kg TDC  -Heparin 3000 U  -No ESA -Calcitriol 1.5      Assessment/Plan: 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 ID following. Repeat blood cultures ordered--+ MSSA.  On cefazolin. TDC removed 6/18 -ID recommends 72 hour line holiday. TTE concerning for endocarditis. TEE cancelled d/t high risk factors. Rpt blood cultures 6/19 -ng to date. WBCs 28. Appreciate ID input. Plan for new catheter Friday - tunneled vs. temp cath per IR.  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 Per primary  ESRD -  HD TTS. Last HD 6/18.  Next HD Friday 6/21 after catheter placed.  Access -  Had L AVF ligation in 2019. Last seen by Dr. Edilia Bo 01/2022 -limited mobility in R arm so planning for L arm graft but had not scheduled surgery yet.   Hypertension/volume  - BP/volume acceptable. Under dry weight.  Anemia  - Hb 10.9 Not on ESA currently -follow trends  Metabolic bone disease -  Ca acceptable. Follow phos --d/c sevelamer d/t SBO.  Nutrition - Renal diet when eating.     Subjective:    Seen in room-- States he's "losing time" Seems a little confused today. IR to place catheter today, dialysis after.    Objective:   BP (!) 103/59 (BP Location: Left Arm)   Pulse 72   Temp 98.4 F (36.9 C) (Oral)   Resp 17   Ht 5\' 9"  (1.753 m)   Wt 81 kg   SpO2 96%   BMI 26.37 kg/m   Physical Exam: QMV:HQIONGE somewhat uncomfortable CVS: RRR Resp: clear Abd: soft Ext: no LE edema ACCESS: None -TDC out   Labs: BMET Recent Labs  Lab 09/07/22 0215 09/08/22 1128 09/09/22 0349 09/10/22 0439 09/11/22 0743  NA 135 137 129* 130* 129*  K 4.1 5.0 4.8 4.6 4.7  CL 91* 87* 85* 89* 84*  CO2 28 23 20* 23 22  GLUCOSE 142* 133* 127* 134* 134*  BUN 43* 88* 109* 72* 105*  CREATININE 8.42* 12.10* 12.26* 8.46* 10.43*  CALCIUM 9.0 9.4 9.0 8.5* 8.7*   PHOS  --   --   --   --  6.3*    CBC Recent Labs  Lab 09/07/22 0215 09/08/22 1128 09/09/22 0349 09/10/22 0439 09/11/22 0436  WBC 23.9* 29.8* 27.9* 23.9* 28.5*  NEUTROABS 21.0*  --   --   --  23.6*  HGB 11.1* 11.8* 11.2* 10.5* 10.9*  HCT 34.7* 35.9* 32.6* 31.6* 32.3*  MCV 87.8 84.1 83.0 82.9 81.6  PLT 154 PLATELET CLUMPS NOTED ON SMEAR, UNABLE TO ESTIMATE 101* 161 157       Medications:     atenolol  50 mg Oral Daily   Chlorhexidine Gluconate Cloth  6 each Topical Daily   cinacalcet  30 mg Oral Q supper   multivitamin  1 tablet Oral QHS   pantoprazole  40 mg Oral Daily   sodium chloride flush  3 mL Intravenous Q12H   sodium zirconium cyclosilicate  10 g Oral Daily     Tomasa Blase PA-C [facility] 09/12/2022,9:21 AM

## 2022-09-12 NOTE — Progress Notes (Addendum)
Progress Note  1 Day Post-Op  Subjective: Patient reports generalized soreness but not severe abdominal pain. He denies nausea or vomiting. He does feel bloated. He is having some diarrhea currently - incontinent with coughing.   Objective: Vital signs in last 24 hours: Temp:  [98.3 F (36.8 C)-98.4 F (36.9 C)] 98.4 F (36.9 C) (06/20 2120) Pulse Rate:  [72] 72 (06/20 2120) Resp:  [17-18] 17 (06/20 2120) BP: (103-137)/(59-74) 103/59 (06/20 2120) SpO2:  [93 %-96 %] 96 % (06/20 2120) Last BM Date : 09/12/22  Intake/Output from previous day: 06/20 0701 - 06/21 0700 In: 100 [IV Piggyback:100] Out: 2 [Stool:2] Intake/Output this shift: No intake/output data recorded.  PE: General: pleasant, WD, WN male who is laying in bed in NAD Heart: rrr Lungs: Respiratory effort nonlabored Abd: soft, minimal distention, not significantly tender, reducible umbilical hernia Psych: A&Ox3 with an appropriate affect.    Lab Results:  Recent Labs    09/10/22 0439 09/11/22 0436  WBC 23.9* 28.5*  HGB 10.5* 10.9*  HCT 31.6* 32.3*  PLT 161 157   BMET Recent Labs    09/10/22 0439 09/11/22 0743  NA 130* 129*  K 4.6 4.7  CL 89* 84*  CO2 23 22  GLUCOSE 134* 134*  BUN 72* 105*  CREATININE 8.46* 10.43*  CALCIUM 8.5* 8.7*   PT/INR Recent Labs    09/10/22 1912  LABPROT 17.0*  INR 1.4*   CMP     Component Value Date/Time   NA 129 (L) 09/11/2022 0743   K 4.7 09/11/2022 0743   CL 84 (L) 09/11/2022 0743   CO2 22 09/11/2022 0743   GLUCOSE 134 (H) 09/11/2022 0743   BUN 105 (H) 09/11/2022 0743   CREATININE 10.43 (H) 09/11/2022 0743   CALCIUM 8.7 (L) 09/11/2022 0743   PROT 8.0 09/08/2022 1128   ALBUMIN 2.7 (L) 09/08/2022 1128   AST 82 (H) 09/08/2022 1128   ALT 58 (H) 09/08/2022 1128   ALKPHOS 106 09/08/2022 1128   BILITOT 0.9 09/08/2022 1128   GFRNONAA 5 (L) 09/11/2022 0743   GFRAA 13 (L) 10/25/2019 2002   Lipase     Component Value Date/Time   LIPASE 27 10/18/2017  1612       Studies/Results: DG Abd Portable 1V-Small Bowel Obstruction Protocol-initial, 8 hr delay  Result Date: 09/11/2022 CLINICAL DATA:  Evaluate small-bowel obstruction EXAM: PORTABLE ABDOMEN - 1 VIEW COMPARISON:  Film from earlier in the same day. FINDINGS: Multiple dilated loops of small bowel are again identified. Previously administered contrast lies within the stomach although has also passed distally into the colon consistent with a partial small bowel obstruction. Right ureteral stent is noted. IMPRESSION: Persistent small bowel dilatation with contrast now seen in the colon consistent with partial small bowel obstruction. Electronically Signed   By: Alcide Clever M.D.   On: 09/11/2022 22:36   ABORTED INVASIVE LAB PROCEDURE  Result Date: 09/11/2022 This case was aborted.  DG Abd 1 View  Result Date: 09/11/2022 CLINICAL DATA:  Small-bowel obstruction EXAM: ABDOMEN - 1 VIEW COMPARISON:  KUB and CT abdomen/pelvis 1 day prior FINDINGS: Gaseous distention of small bowel in the midabdomen measuring up to 5.2 cm is not significantly changed. There is no definite free intraperitoneal air, within the confines of supine technique. There is no abnormal soft tissue calcification. A ureteral stent is noted in the right pelvis. There is no acute osseous abnormality. IMPRESSION: Similar gaseous distention of the small bowel compared to the study from 1 day prior.  Electronically Signed   By: Lesia Hausen M.D.   On: 09/11/2022 08:19   CT ABDOMEN PELVIS WO CONTRAST  Result Date: 09/10/2022 CLINICAL DATA:  Bowel obstruction suspected EXAM: CT ABDOMEN AND PELVIS WITHOUT CONTRAST TECHNIQUE: Multidetector CT imaging of the abdomen and pelvis was performed following the standard protocol without IV contrast. RADIATION DOSE REDUCTION: This exam was performed according to the departmental dose-optimization program which includes automated exposure control, adjustment of the mA and/or kV according to patient  size and/or use of iterative reconstruction technique. COMPARISON:  Abdominal radiographs 09/10/2022 and CT chest abdomen and pelvis 09/07/2022 FINDINGS: Lower chest: Multiple new pulmonary nodules in the right lower lobe measuring up to 17 mm which were not present on 09/07/2022. Given rapid development these are infectious/inflammatory. The previously seen 6 mm nodule in the left lower lobe has resolved. Hepatobiliary: Mild hepatic steatosis. Decompressed gallbladder. No biliary dilation. Pancreas: Unremarkable. Spleen: Unremarkable. Adrenals/Urinary Tract: Normal adrenal glands. Atrophic native kidneys. No urinary calculi or hydronephrosis. 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 A ureteral stent again extends from the transplant kidney into the bladder. Irregular bladder wall thickening. Stomach/Bowel: Diffuse dilation of the small bowel in the anterior abdomen and pelvis there is abrupt transition point in the right anterior abdomen (series 6/image 37 and series 3/image 55) in an area of mild wall thickening. Enteric contrast is present within the small bowel reaching a point in the left lower quadrant where there is moderate dilation and wall thickening of the small bowel (circa series 3/image 64). Stomach is within normal limits. Normal caliber colon. Colonic diverticulosis without diverticulitis. Normal appendix. Vascular/Lymphatic: Aortic atherosclerosis. No enlarged abdominal or pelvic lymph nodes. Reproductive: Unremarkable. Other: No free intraperitoneal fluid or air. Fat containing left inguinal hernia. Musculoskeletal: No acute osseous abnormality or destructive osseous lesion. Dense bones consistent with renal osteodystrophy. Lucency in the L2 vertebral body are stable since 2020 consistent with Schmorl's nodes or changes of hyperparathyroidism. IMPRESSION: 1. Small-bowel obstruction with transition point in the right anterior abdomen in an area  of mild wall thickening. 2. Enteric contrast is present within the small bowel reaching a point in the left lower quadrant where there is moderate dilation and apparent wall thickening. Wall thickening may be due to infectious or inflammatory enteritis however neoplastic infiltration is not excluded. Continued attention on follow-up. 3. Multiple new pulmonary nodules in the right lower lobe measuring up to 17 mm which were not present on 09/07/2022. Given rapid development these are compatible with infection and/or aspiration. 4. Irregular bladder wall thickening. Recommend correlation with urinalysis. Aortic Atherosclerosis (ICD10-I70.0). Electronically Signed   By: Minerva Fester M.D.   On: 09/10/2022 19:56    Anti-infectives: Anti-infectives (From admission, onward)    Start     Dose/Rate Route Frequency Ordered Stop   09/11/22 1800  ceFAZolin (ANCEF) IVPB 1 g/50 mL premix        1 g 100 mL/hr over 30 Minutes Intravenous Daily-1800 09/11/22 0848     09/09/22 1200  ceFAZolin (ANCEF) IVPB 2g/100 mL premix  Status:  Discontinued        2 g 200 mL/hr over 30 Minutes Intravenous Every T-Th-Sa (Hemodialysis) 09/07/22 1840 09/11/22 0848   09/08/22 0300  ceFEPIme (MAXIPIME) 1 g in sodium chloride 0.9 % 100 mL IVPB  Status:  Discontinued        1 g 200 mL/hr over 30 Minutes Intravenous Every 24 hours 09/07/22 0539 09/07/22 0647   09/07/22 1530  ceFAZolin (ANCEF) IVPB 2g/100 mL premix        2 g 200 mL/hr over 30 Minutes Intravenous  Once 09/07/22 1520 09/07/22 1800   09/07/22 0645  vancomycin (VANCOREADY) IVPB 750 mg/150 mL        750 mg 150 mL/hr over 60 Minutes Intravenous  Once 09/07/22 0639 09/07/22 0909   09/07/22 0538  vancomycin variable dose per unstable renal function (pharmacist dosing)  Status:  Discontinued         Does not apply See admin instructions 09/07/22 0539 09/07/22 0647   09/07/22 0530  metroNIDAZOLE (FLAGYL) IVPB 500 mg  Status:  Discontinued        500 mg 100 mL/hr over 60  Minutes Intravenous Every 12 hours 09/07/22 0515 09/07/22 1551   09/07/22 0200  vancomycin (VANCOCIN) IVPB 1000 mg/200 mL premix        1,000 mg 200 mL/hr over 60 Minutes Intravenous  Once 09/07/22 0155 09/07/22 0622   09/07/22 0200  ceFEPIme (MAXIPIME) 2 g in sodium chloride 0.9 % 100 mL IVPB        2 g 200 mL/hr over 30 Minutes Intravenous  Once 09/07/22 0155 09/07/22 0350        Assessment/Plan  SBO - CT AP yesterday with SBO with transition in R anterior abdomen in area of mild bowel wall thickening - ?infections vs inflammatory enteritis vs neoplastic infiltration  - pt having diarrhea with some incontinence, no nausea or vomiting, occasionally crampy pains associated with diarrhea  - getting tunneled HD cath  - can hold off on NGT placement for now but if n/v would recommend placement - mild allergy to iodinated contrast - should be ok to have PO gastrografin for SBO protocol  - no indication for emergent surgical intervention but we will follow   Ok for liquids from our perspective.  Does not clinically appear to obstructed in fact was having multiple loose bowel movements while I was in the room.  Suspect he may have had a significant gastroenteritis type insult leading to diarrhea and an associated small bowel type ileus picture.  If not improving over the next day or 2, may consider repeating CT scan.  He does have contrast evident in his colon on his plain films.  FEN: NPO   VTE: SCDs ID: [id number] for bacteremia   - per TRH -  L5-S1 discitis/osteomyelitis  MSSA bacteremia  ESRD on HD HTN Lung nodules   LOS: 5 days   I spent a total of 35 minutes in both face-to-face and non-face-to-face activities, excluding procedures performed, for this visit on the date of this encounter.  I reviewed Consultant cardiology, nephrology, ID notes, hospitalist notes, last 24 h vitals and pain scores, last 48 h intake and output, last 24 h labs and trends, and last 24 h imaging  results.  Marin Olp, MD [facility], [facility]

## 2022-09-12 NOTE — Progress Notes (Signed)
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  MRI showed possible L5-S1 early osteomyelitis. - Patient was febrile with leukocytosis 22 K on arrival.  Respiratory viral panel negative.  He had no respiratory complaints this a.m.  Found to have MSSA bacteremia as well. - No wounds noted.  He does have a right IJ which she notes he has had less than a year.  I suspect this is the source of [facility] removed on 6/18. Non tunneled HD cath place don 6/21 - TTE showed large rounded structure on mitral valve. Concern for endocarditis. - TEE was planned but given given notable lab work BUN 105 was canceled. - CTS engaged and no plans for surgical intervention, recommend antibiotics.  We can consider further workup TEE/cath if ongoing issues. Recommendations:  -Continue cefazolin, anticipate 6 weeks antibiotics with HD from line removal on 6/18 -Follow repeat blood Cx to ensure clearance  #SBO -Patient had not had a bowel movement for several days - CT scan showed concern for SBO.  General surgery engaged, patient underwent SBO protocol noted partial SBO at the time.  It also noted new pulmonary nodules in the right lower lobe.  Given rapid development Biber infection/aspiration.  Microbiology:   Antibiotics: Vancomycin6/15 Cefepime 6/15 Metro 6/15 Cefazolin  6/16- Cultures: Blood 6/16 2/2 MSSA 6/17 pending   SUBJECTIVE: No new complaints Interval: Afebrile overnight. Wbc 27k  Review of Systems: Review of Systems  All other systems reviewed and are negative.    Scheduled Meds:  atenolol  50 mg Oral Daily   Chlorhexidine Gluconate Cloth  6 each Topical Daily   cinacalcet  30 mg Oral Q supper   heparin sodium (porcine)       multivitamin  1 tablet Oral QHS   pantoprazole  40 mg Oral Daily   sodium chloride flush  3 mL Intravenous Q12H   sodium zirconium cyclosilicate  10 g Oral Daily   Continuous Infusions:   ceFAZolin (ANCEF) IV 1 g (09/12/22 2155)   chlorproMAZINE (THORAZINE) 12.5 mg in sodium chloride 0.9 % 25 mL IVPB 12.5 mg (09/12/22 0000)   methocarbamol (ROBAXIN) IV 500 mg (09/11/22 2229)   PRN Meds:.acetaminophen **OR** acetaminophen, chlorproMAZINE (THORAZINE) 12.5 mg in sodium chloride 0.9 % 25 mL IVPB, guaiFENesin, heparin sodium (porcine), HYDROmorphone (DILAUDID) injection, lip balm, methocarbamol (ROBAXIN) IV, ondansetron **OR** ondansetron (ZOFRAN) IV, oxyCODONE, senna Allergies  Allergen Reactions   Azithromycin Nausea And Vomiting and Other (See Comments)    Chest tightness  Other Reaction(s): GI Intolerance   Iodinated Contrast Media Itching and Nausea Only    Probably needs prednisone prep prior to contrast    OBJECTIVE: Vitals:   09/12/22 1934 09/12/22 2034 09/12/22 2101 09/12/22 2103  BP: 101/69 106/64 (!) 100/57   Pulse: 84 88 85  Resp: 19 20 18    Temp:  98 F (36.7 C) 98 F (36.7 C)   TempSrc:  Oral Oral   SpO2: 100% 97% 100%   Weight:    81.2 kg  Height:       Body mass index is 26.44 kg/m.  Physical Exam Constitutional:      General: He is not in acute distress.    Appearance: He is normal weight. He is not toxic-appearing.  HENT:     Head: Normocephalic and atraumatic.     Right Ear: External ear normal.     Left Ear: External ear normal.     Nose: No congestion or rhinorrhea.      Mouth/Throat:     Mouth: Mucous membranes are moist.     Pharynx: Oropharynx is clear.  Eyes:     Extraocular Movements: Extraocular movements intact.     Conjunctiva/sclera: Conjunctivae normal.     Pupils: Pupils are equal, round, and reactive to light.  Cardiovascular:     Rate and Rhythm: Normal rate and regular rhythm.     Heart sounds: No murmur heard.    No friction rub. No gallop.  Pulmonary:     Effort: Pulmonary effort is normal.     Breath sounds: Normal breath sounds.  Abdominal:     General: Abdomen is flat. Bowel sounds are normal.     Palpations: Abdomen is soft.  Musculoskeletal:        General: No swelling. Normal range of motion.     Cervical back: Normal range of motion and neck supple.  Skin:    General: Skin is warm and dry.  Neurological:     General: No focal deficit present.     Mental Status: He is oriented to person, place, and time.  Psychiatric:        Mood and Affect: Mood normal.       Lab Results Lab Results  Component Value Date   WBC 27.5 (H) 09/12/2022   HGB 10.2 (L) 09/12/2022   HCT 29.7 (L) 09/12/2022   MCV 81.6 09/12/2022   PLT 230 09/12/2022    Lab Results  Component Value Date   CREATININE 13.36 (H) 09/12/2022   BUN 135 (H) 09/12/2022   NA 129 (L) 09/12/2022   K 5.4 (H) 09/12/2022   CL 86 (L) 09/12/2022   CO2 18 (L) 09/12/2022    Lab Results  Component Value Date   ALT 58 (H) 09/08/2022   AST 82 (H) 09/08/2022   ALKPHOS 106 09/08/2022   BILITOT 0.9 09/08/2022        Danelle Earthly, MD [facility] 09/12/2022, 10:39 PM  I have personally spent 54 minutes involved in face-to-face and non-face-to-face activities for this patient on the day of the visit. Professional time spent includes the following activities: Preparing to see the patient (review of tests), Obtaining and/or reviewing separately obtained history (admission/discharge record), Performing a medically  appropriate examination and/or evaluation , Ordering medications/tests/procedures, referring and communicating with other health care professionals, Documenting clinical information in the EMR, Independently interpreting results (not separately reported), Communicating results to the patient/family/caregiver, Counseling and educating the patient/family/caregiver and Care coordination (not separately reported).

## 2022-09-12 NOTE — TOC Progression Note (Signed)
Transition of Care [facility]) - Progression Note    Patient Details  Name: Ryan [name] MRN: [id number] Date of Birth: [birth year]  Transition of Care [facility]) CM/SW Contact  Tom-Johnson, Hershal Coria, RN Phone Number: 09/12/2022, 11:09 AM  Clinical Narrative:     CM spoke with patient at bedside about home health recommendation. Patient states he has no preference. CM called in referral to [facility] and Amy voiced acceptance, info on AVS. BSC and RW ordered from [employer]. Jermaine to deliver to patient at bedside.   CM will continue to follow as patient progresses with care towards discharge.    Barriers to Discharge: Continued Medical Work up  Expected Discharge Plan and Services   Discharge Planning Services: CM Consult   Living arrangements for the past 2 months: Single Family Home                                       Social Determinants of Health (SDOH) Interventions SDOH Screenings   Food Insecurity: No Food Insecurity (09/11/2022)  Housing: Low Risk  (09/11/2022)  Transportation Needs: No Transportation Needs (09/11/2022)  Utilities: Not At Risk (09/11/2022)  Tobacco Use: Low Risk  (09/11/2022)    Readmission Risk Interventions    09/10/2022    1:53 PM  Readmission Risk Prevention Plan  Transportation Screening Complete  PCP or Specialist Appt within 5-7 Days Complete  Home Care Screening Complete  Medication Review (RN CM) Referral to Pharmacy

## 2022-09-12 NOTE — Progress Notes (Addendum)
Received patient in bed.Awake ,alert and oriented x 4 .Consent verified.  Access used : Newly inserted right femoral catheter.  Medicine given : Heparin 3,000 units  pre-run bolus.  Duration of treatment: 3.75 hour.  Fluid goal : 2 liters.  Hemo comment : Newly placed temp femoral catheter has been working well since the start of the treatment.  Hand off to the night hd nurse.

## 2022-09-12 NOTE — Progress Notes (Signed)
PROGRESS NOTE    Ryan [name]  JYN:[id number] DOB: Mar 31, 1960 DOA: 09/07/2022 PCP: Sharin Grave, MD   Brief Narrative: Ryan [name] is a 62 y.o. male with a history of hypertension, hyperlipidemia, ESRD on hemodialysis.  Patient presented secondary to low back pain and was found to have evidence of L5-S1 discitis/osteomyelitis in addition to MSSA bacteremia.  ID consulted.  Workup significant for possible endocarditis. Patient also found to have a small bowel obstruction.   Assessment/Plan:  L5-S1 discitis/osteomyelitis MSSA bacteremia Confirmed possible early discitis/osteomyelitis on MRI imaging.  Blood cultures positive for MSSA.  Patient was empirically treated with vancomycin and cefepime.  Infectious disease consulted.  Patient transition to cefazolin IV with hemodialysis.  Transthoracic echocardiogram significant for a large, rounded structure on the anterior leaflet of the mitral valve concerning for endocarditis.  ID recommending transesophageal echocardiogram; cardiology consulted. Transesophageal Echocardiogram deferred secondary to risk/benefit profile. -Infectious disease recommendations: TCTS consult for consideration of surgical management of endocarditis.  ESRD on hemodialysis Nephrology consulted.  Patient presented with a tunneled dialysis catheter which was removed on 6/18 secondary to bacteremia.  Per ID, patient requires a 72-hour line holiday.  Patient last received hemodialysis on 6/18. Interventional radiology consulted and placed non-tunneled HD cath on 6/21. -Nephrology recommendations: plan for HD today  Intractable hiccups Patient started on Thorazine as needed. Recurrent.  Small bowel obstruction Patient without a bowel movement for several days initially.  Diagnosis confirmed on CT scan with transition point located in the right anterior abdomen.  General surgery consulted. Patient underwent SBO protocol and gastrografin made it to his colon  suggesting at most a partial small bowel obstruction at that time, although per general surgery, possible ileus related to possible gastroenteritis. -General surgery recommendations: clear liquid diet -Limit narcotics  Primary hypertension -Continue atenolol  Elevated troponin Previously noted. No associated chest pain. Presumed secondary to demand ischemia.  Lung nodules Noted on initial CT scan.  Initial recommendation for repeat CT scan of the chest in 18 to 24 months.  Repeat CT scan on 6/20 showed multiple new pulmonary nodules compatible with likely active infection and/or aspiration.  Patient without symptoms to suggest pneumonia although it is likely these nodules are septic in etiology.  Bladder wall thickening Noted incidentally on CT imaging.  No urinary symptoms and patient does not make urine secondary to ESRD. Unclear if this correlates with isolated infection or is related to overall infectious picture.   DVT prophylaxis: SCDs Code Status:   Code Status: Full Code Family Communication: None at bedside Disposition Plan: Discharge home pending completion of endocarditis workup, and outpatient antibiotic regimen recommendations   Consultants:  Infectious disease Nephrology Interventional radiology  Procedures:  Hemodialysis 6/18: Successful removal of tunneled hemodialysis catheter   Antimicrobials: Vancomycin IV Flagyl IV Cefepime IV Cefazolin IV   Subjective: Bowel movements. No emesis.  Objective: BP 110/68 (BP Location: Left Arm)   Pulse 74   Temp 98.4 F (36.9 C) (Oral)   Resp 18   Ht 5\' 9"  (1.753 m)   Wt 81 kg   SpO2 98%   BMI 26.37 kg/m   Examination:  General exam: Appears calm and comfortable Respiratory system: Clear to auscultation. Cardiovascular system: S1 & S2 heard, RRR. Gastrointestinal system: Abdomen is mildly distended, soft and non-tender. Normal bowel sounds heard. Central nervous system: Alert and  oriented. Musculoskeletal: No calf tenderness Skin: No cyanosis. No rashes Psychiatry: Judgement and insight appear normal. Mood & affect appropriate.    Data Reviewed: I  have personally reviewed following labs and imaging studies   Last CBC Lab Results  Component Value Date   WBC 28.5 (H) 09/11/2022   HGB 10.9 (L) 09/11/2022   HCT 32.3 (L) 09/11/2022   MCV 81.6 09/11/2022   MCH 27.5 09/11/2022   RDW 14.7 09/11/2022   PLT 157 09/11/2022     Last metabolic panel Lab Results  Component Value Date   GLUCOSE 134 (H) 09/11/2022   NA 129 (L) 09/11/2022   K 4.7 09/11/2022   CL 84 (L) 09/11/2022   CO2 22 09/11/2022   BUN 105 (H) 09/11/2022   CREATININE 10.43 (H) 09/11/2022   GFRNONAA 5 (L) 09/11/2022   CALCIUM 8.7 (L) 09/11/2022   PHOS 6.3 (H) 09/11/2022   PROT 8.0 09/08/2022   ALBUMIN 2.7 (L) 09/08/2022   BILITOT 0.9 09/08/2022   ALKPHOS 106 09/08/2022   AST 82 (H) 09/08/2022   ALT 58 (H) 09/08/2022   ANIONGAP 23 (H) 09/11/2022     Creatinine Clearance: Estimated Creatinine Clearance: 7.4 mL/min (A) (by C-G formula based on SCr of 10.43 mg/dL (H)).  Recent Results (from the past 240 hour(s))  Blood culture (routine x 2)     Status: Abnormal   Collection Time: 09/07/22  2:15 AM   Specimen: BLOOD  Result Value Ref Range Status   Specimen Description   Final    BLOOD LEFT ANTECUBITAL Performed at [facility], [address] [place], [place] [address]    Special Requests   Final    BOTTLES DRAWN AEROBIC AND ANAEROBIC Blood Culture adequate volume Performed at [facility], [address] [place], [place] [address]    Culture  Setup Time   Final    GRAM POSITIVE COCCI IN BOTH AEROBIC AND ANAEROBIC BOTTLES CRITICAL VALUE NOTED.  VALUE IS CONSISTENT WITH PREVIOUSLY REPORTED AND CALLED VALUE.    Culture (A)  Final    STAPHYLOCOCCUS AUREUS SUSCEPTIBILITIES PERFORMED ON PREVIOUS CULTURE WITHIN THE LAST 5 DAYS. Performed at  [facility], [address], [place], [place] [address]    Report Status 09/09/2022 FINAL  Final  Blood culture (routine x 2)     Status: Abnormal   Collection Time: 09/07/22  2:51 AM   Specimen: BLOOD  Result Value Ref Range Status   Specimen Description   Final    BLOOD BLOOD RIGHT HAND Performed at [facility], [address] [place], [place] [address]    Special Requests   Final    BOTTLES DRAWN AEROBIC AND ANAEROBIC Blood Culture adequate volume Performed at [facility], [address], [place], [place] [address]    Culture  Setup Time   Final    GRAM POSITIVE COCCI IN BOTH AEROBIC AND ANAEROBIC BOTTLES CRITICAL RESULT CALLED TO, READ BACK BY AND VERIFIED WITH: PHARMD NICK GLOGOVAC [id number] 1503 BY EC Performed at [facility], [address], [place], [place] [address]    Culture STAPHYLOCOCCUS AUREUS (A)  Final   Report Status 09/09/2022 FINAL  Final   Organism ID, Bacteria STAPHYLOCOCCUS AUREUS  Final      Susceptibility   Staphylococcus aureus - MIC*    CIPROFLOXACIN <=0.5 SENSITIVE Sensitive     ERYTHROMYCIN <=0.25 SENSITIVE Sensitive     GENTAMICIN <=0.5 SENSITIVE Sensitive     OXACILLIN 0.5 SENSITIVE Sensitive     TETRACYCLINE <=1 SENSITIVE Sensitive     VANCOMYCIN 1 SENSITIVE Sensitive     TRIMETH/SULFA <=10 SENSITIVE Sensitive  CLINDAMYCIN <=0.25 SENSITIVE Sensitive     RIFAMPIN <=0.5 SENSITIVE Sensitive     Inducible Clindamycin NEGATIVE Sensitive     LINEZOLID 2 SENSITIVE Sensitive     * STAPHYLOCOCCUS AUREUS  Blood Culture ID Panel (Reflexed)     Status: Abnormal   Collection Time: 09/07/22  2:51 AM  Result Value Ref Range Status   Enterococcus faecalis NOT DETECTED NOT DETECTED Final   Enterococcus Faecium NOT DETECTED NOT DETECTED Final   Listeria monocytogenes NOT DETECTED NOT DETECTED Final   Staphylococcus species DETECTED (A) NOT DETECTED Final    Comment: CRITICAL RESULT CALLED TO, READ BACK  BY AND VERIFIED WITH: PHARMD NICK GLOGOVAC [id number] AT 1503 BY EC    Staphylococcus aureus (BCID) DETECTED (A) NOT DETECTED Final    Comment: CRITICAL RESULT CALLED TO, READ BACK BY AND VERIFIED WITH: PHARMD NICK GLOGOVAC [id number] AT 1503 BY EC    Staphylococcus epidermidis NOT DETECTED NOT DETECTED Final   Staphylococcus lugdunensis NOT DETECTED NOT DETECTED Final   Streptococcus species NOT DETECTED NOT DETECTED Final   Streptococcus agalactiae NOT DETECTED NOT DETECTED Final   Streptococcus pneumoniae NOT DETECTED NOT DETECTED Final   Streptococcus pyogenes NOT DETECTED NOT DETECTED Final   A.calcoaceticus-baumannii NOT DETECTED NOT DETECTED Final   Bacteroides fragilis NOT DETECTED NOT DETECTED Final   Enterobacterales NOT DETECTED NOT DETECTED Final   Enterobacter cloacae complex NOT DETECTED NOT DETECTED Final   Escherichia coli NOT DETECTED NOT DETECTED Final   Klebsiella aerogenes NOT DETECTED NOT DETECTED Final   Klebsiella oxytoca NOT DETECTED NOT DETECTED Final   Klebsiella pneumoniae NOT DETECTED NOT DETECTED Final   Proteus species NOT DETECTED NOT DETECTED Final   Salmonella species NOT DETECTED NOT DETECTED Final   Serratia marcescens NOT DETECTED NOT DETECTED Final   Haemophilus influenzae NOT DETECTED NOT DETECTED Final   Neisseria meningitidis NOT DETECTED NOT DETECTED Final   Pseudomonas aeruginosa NOT DETECTED NOT DETECTED Final   Stenotrophomonas maltophilia NOT DETECTED NOT DETECTED Final   Candida albicans NOT DETECTED NOT DETECTED Final   Candida auris NOT DETECTED NOT DETECTED Final   Candida glabrata NOT DETECTED NOT DETECTED Final   Candida krusei NOT DETECTED NOT DETECTED Final   Candida parapsilosis NOT DETECTED NOT DETECTED Final   Candida tropicalis NOT DETECTED NOT DETECTED Final   Cryptococcus neoformans/gattii NOT DETECTED NOT DETECTED Final   Meth resistant mecA/C and MREJ NOT DETECTED NOT DETECTED Final    Comment: Performed at [facility], [address], [place], [place] [address]  Respiratory (~20 pathogens) panel by PCR     Status: None   Collection Time: 09/07/22  5:45 AM   Specimen: Anterior Nasal Swab; Respiratory  Result Value Ref Range Status   Adenovirus NOT DETECTED NOT DETECTED Final   Coronavirus 229E NOT DETECTED NOT DETECTED Final    Comment: (NOTE) The Coronavirus on the Respiratory Panel, DOES NOT test for the novel  Coronavirus (2019 nCoV)    Coronavirus HKU1 NOT DETECTED NOT DETECTED Final   Coronavirus NL63 NOT DETECTED NOT DETECTED Final   Coronavirus OC43 NOT DETECTED NOT DETECTED Final   Metapneumovirus NOT DETECTED NOT DETECTED Final   Rhinovirus / Enterovirus NOT DETECTED NOT DETECTED Final   Influenza A NOT DETECTED NOT DETECTED Final   Influenza B NOT DETECTED NOT DETECTED Final   Parainfluenza Virus 1 NOT DETECTED NOT DETECTED Final   Parainfluenza Virus 2 NOT DETECTED NOT DETECTED Final   Parainfluenza Virus 3 NOT DETECTED NOT DETECTED  Final   Parainfluenza Virus 4 NOT DETECTED NOT DETECTED Final   Respiratory Syncytial Virus NOT DETECTED NOT DETECTED Final   Bordetella pertussis NOT DETECTED NOT DETECTED Final   Bordetella Parapertussis NOT DETECTED NOT DETECTED Final   Chlamydophila pneumoniae NOT DETECTED NOT DETECTED Final   Mycoplasma pneumoniae NOT DETECTED NOT DETECTED Final    Comment: Performed at [facility], [address] [place], [place] [address]  SARS Coronavirus 2 by RT PCR (hospital order, performed in [facility] hospital lab) *cepheid single result test* Anterior Nasal Swab     Status: None   Collection Time: 09/07/22  5:45 AM   Specimen: Anterior Nasal Swab  Result Value Ref Range Status   SARS Coronavirus 2 by RT PCR NEGATIVE NEGATIVE Final    Comment: (NOTE) SARS-CoV-2 target nucleic acids are NOT DETECTED.  The SARS-CoV-2 RNA is generally detectable in upper and lower respiratory specimens during the acute phase of infection. The  lowest concentration of SARS-CoV-2 viral copies this assay can detect is 250 copies / mL. A negative result does not preclude SARS-CoV-2 infection and should not be used as the sole basis for treatment or other patient management decisions.  A negative result may occur with improper specimen collection / handling, submission of specimen other than nasopharyngeal swab, presence of viral mutation(s) within the areas targeted by this assay, and inadequate number of viral copies (<250 copies / mL). A negative result must be combined with clinical observations, patient history, and epidemiological information.  Fact Sheet for Patients:   RoadLapTop.co.za  Fact Sheet for Healthcare Providers: http://kim-miller.com/  This test is not yet approved or  cleared by the [place] FDA and has been authorized for detection and/or diagnosis of SARS-CoV-2 by FDA under an Emergency Use Authorization (EUA).  This EUA will remain in effect (meaning this test can be used) for the duration of the COVID-19 declaration under Section 564(b)(1) of the Act, 21 U.S.C. section 360bbb-3(b)(1), unless the authorization is terminated or revoked sooner.  Performed at [facility], [address] [place], [place] [address]   Culture, blood (Routine X 2) w Reflex to ID Panel     Status: None (Preliminary result)   Collection Time: 09/08/22 11:28 AM   Specimen: BLOOD LEFT HAND  Result Value Ref Range Status   Specimen Description BLOOD LEFT HAND  Final   Special Requests   Final    BOTTLES DRAWN AEROBIC AND ANAEROBIC Blood Culture adequate volume   Culture   Final    NO GROWTH 4 DAYS Performed at [facility], [address], [place], [place] [address]    Report Status PENDING  Incomplete  Culture, blood (Routine X 2) w Reflex to ID Panel     Status: None (Preliminary result)   Collection Time: 09/08/22 11:28 AM   Specimen: BLOOD RIGHT  HAND  Result Value Ref Range Status   Specimen Description BLOOD RIGHT HAND  Final   Special Requests   Final    BOTTLES DRAWN AEROBIC AND ANAEROBIC Blood Culture adequate volume   Culture   Final    NO GROWTH 4 DAYS Performed at [facility], [address], [place], [place] [address]    Report Status PENDING  Incomplete  Culture, blood (Routine X 2) w Reflex to ID Panel     Status: None (Preliminary result)   Collection Time: 09/10/22  3:16 PM   Specimen: BLOOD  Result Value Ref Range Status  Specimen Description BLOOD RIGHT ANTECUBITAL  Final   Special Requests   Final    BOTTLES DRAWN AEROBIC AND ANAEROBIC Blood Culture adequate volume   Culture   Final    NO GROWTH 2 DAYS Performed at [facility], [address], [place], [place] [address]    Report Status PENDING  Incomplete  Culture, blood (Routine X 2) w Reflex to ID Panel     Status: None (Preliminary result)   Collection Time: 09/10/22  3:16 PM   Specimen: BLOOD LEFT ARM  Result Value Ref Range Status   Specimen Description BLOOD LEFT ARM  Final   Special Requests   Final    BOTTLES DRAWN AEROBIC AND ANAEROBIC Blood Culture adequate volume   Culture   Final    NO GROWTH 2 DAYS Performed at [facility], [address] [place], [place] [address]    Report Status PENDING  Incomplete  MRSA Next Gen by PCR, Nasal     Status: None   Collection Time: 09/11/22  9:11 AM   Specimen: Nasal Mucosa; Nasal Swab  Result Value Ref Range Status   MRSA by PCR Next Gen NOT DETECTED NOT DETECTED Final    Comment: (NOTE) The GeneXpert MRSA Assay (FDA approved for NASAL specimens only), is one component of a comprehensive MRSA colonization surveillance program. It is not intended to diagnose MRSA infection nor to guide or monitor treatment for MRSA infections. Test performance is not FDA approved in patients less than 2 years old. Performed at [facility], [address] [place], [place] [address]        Radiology Studies: DG Abd Portable 1V-Small Bowel Obstruction Protocol-initial, 8 hr delay  Result Date: 09/11/2022 CLINICAL DATA:  Evaluate small-bowel obstruction EXAM: PORTABLE ABDOMEN - 1 VIEW COMPARISON:  Film from earlier in the same day. FINDINGS: Multiple dilated loops of small bowel are again identified. Previously administered contrast lies within the stomach although has also passed distally into the colon consistent with a partial small bowel obstruction. Right ureteral stent is noted. IMPRESSION: Persistent small bowel dilatation with contrast now seen in the colon consistent with partial small bowel obstruction. Electronically Signed   By: Alcide Clever M.D.   On: 09/11/2022 22:36   ABORTED INVASIVE LAB PROCEDURE  Result Date: 09/11/2022 This case was aborted.  DG Abd 1 View  Result Date: 09/11/2022 CLINICAL DATA:  Small-bowel obstruction EXAM: ABDOMEN - 1 VIEW COMPARISON:  KUB and CT abdomen/pelvis 1 day prior FINDINGS: Gaseous distention of small bowel in the midabdomen measuring up to 5.2 cm is not significantly changed. There is no definite free intraperitoneal air, within the confines of supine technique. There is no abnormal soft tissue calcification. A ureteral stent is noted in the right pelvis. There is no acute osseous abnormality. IMPRESSION: Similar gaseous distention of the small bowel compared to the study from 1 day prior. Electronically Signed   By: Lesia Hausen M.D.   On: 09/11/2022 08:19   CT ABDOMEN PELVIS WO CONTRAST  Result Date: 09/10/2022 CLINICAL DATA:  Bowel obstruction suspected EXAM: CT ABDOMEN AND PELVIS WITHOUT CONTRAST TECHNIQUE: Multidetector CT imaging of the abdomen and pelvis was performed following the standard protocol without IV contrast. RADIATION DOSE REDUCTION: This exam was performed according to the departmental dose-optimization program which includes automated exposure control, adjustment of the mA and/or kV according to patient size  and/or use of iterative reconstruction technique. COMPARISON:  Abdominal radiographs 09/10/2022 and CT chest abdomen and  pelvis 09/07/2022 FINDINGS: Lower chest: Multiple new pulmonary nodules in the right lower lobe measuring up to 17 mm which were not present on 09/07/2022. Given rapid development these are infectious/inflammatory. The previously seen 6 mm nodule in the left lower lobe has resolved. Hepatobiliary: Mild hepatic steatosis. Decompressed gallbladder. No biliary dilation. Pancreas: Unremarkable. Spleen: Unremarkable. Adrenals/Urinary Tract: Normal adrenal glands. Atrophic native kidneys. No urinary calculi or hydronephrosis. 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 A ureteral stent again extends from the transplant kidney into the bladder. Irregular bladder wall thickening. Stomach/Bowel: Diffuse dilation of the small bowel in the anterior abdomen and pelvis there is abrupt transition point in the right anterior abdomen (series 6/image 37 and series 3/image 55) in an area of mild wall thickening. Enteric contrast is present within the small bowel reaching a point in the left lower quadrant where there is moderate dilation and wall thickening of the small bowel (circa series 3/image 64). Stomach is within normal limits. Normal caliber colon. Colonic diverticulosis without diverticulitis. Normal appendix. Vascular/Lymphatic: Aortic atherosclerosis. No enlarged abdominal or pelvic lymph nodes. Reproductive: Unremarkable. Other: No free intraperitoneal fluid or air. Fat containing left inguinal hernia. Musculoskeletal: No acute osseous abnormality or destructive osseous lesion. Dense bones consistent with renal osteodystrophy. Lucency in the L2 vertebral body are stable since 2020 consistent with Schmorl's nodes or changes of hyperparathyroidism. IMPRESSION: 1. Small-bowel obstruction with transition point in the right anterior abdomen in an area of  mild wall thickening. 2. Enteric contrast is present within the small bowel reaching a point in the left lower quadrant where there is moderate dilation and apparent wall thickening. Wall thickening may be due to infectious or inflammatory enteritis however neoplastic infiltration is not excluded. Continued attention on follow-up. 3. Multiple new pulmonary nodules in the right lower lobe measuring up to 17 mm which were not present on 09/07/2022. Given rapid development these are compatible with infection and/or aspiration. 4. Irregular bladder wall thickening. Recommend correlation with urinalysis. Aortic Atherosclerosis (ICD10-I70.0). Electronically Signed   By: Minerva Fester M.D.   On: 09/10/2022 19:56      LOS: 5 days    Jacquelin Hawking, MD [facility] 09/12/2022, 2:14 PM   If 7PM-7AM, please contact night-coverage [email or web address]

## 2022-09-12 NOTE — Consult Note (Signed)
[address]       [place] [address]             [phone]           Ryan [name] [facility] Medical Record #[id number] Date of Birth: June 16, 1960  No ref. provider found Ryan Grave, MD  Chief Complaint:    Chief Complaint  Patient presents with   Back Pain    [name], pt reports cough x 3 days, that is hurting back. Pt also reports diarrhea when coughing. Pt on dialysis , T.Thrus. Sat. Last dialysis was 09/06/22. Fever 102.5 in triage, pt reports lack of appetite and increased fatigue.    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 Cardiology deferred TEE secondary to time with planning for surgery. Have been asked to comment on surgical management. Hospital course also complicated with partial SBO      Past Medical History:  Diagnosis Date   Allergy    Anemia    Blood transfusion without reported diagnosis    Dialysis patient (HCC)    Tues,thurs,sat   ESRD (end stage renal disease) (HCC)    TTHS-    Family history of adverse reaction to anesthesia    father had allergic reaction with anesthesia with a dental procedure-(pt. doesn't know)   Hyperlipidemia    Hypertension     Past Surgical History:  Procedure Laterality Date   A/V FISTULAGRAM Left 12/15/2016   Procedure: A/V Fistulagram - left;  Surgeon: Chuck Hint, MD;  Location: [facility] INVASIVE CV LAB;  Service: Cardiovascular;  Laterality: Left;   AV FISTULA PLACEMENT Left 08/28/2016   Procedure: LEFT UPPER  ARM ARTERIOVENOUS (AV) FISTULA CREATION;  Surgeon: Chuck Hint, MD;  Location: [facility] OR;  Service: Vascular;  Laterality: Left;   DIALYSIS/PERMA CATHETER INSERTION Right 12/21/2017   Procedure: INSERTION OF DIALYSIS CATHETER Right Internal Jugular .;  Surgeon: Sherren Kerns, MD;  Location: [facility] OR;  Service: Vascular;   Laterality: Right;   FISTULA SUPERFICIALIZATION Left 09/23/2017   Procedure: FISTULA PLICATION LEFT ARM;  Surgeon: Sherren Kerns, MD;  Location: [facility] OR;  Service: Vascular;  Laterality: Left;   FISTULOGRAM Left 12/21/2017   Procedure: FISTULOGRAM with Balloon Angioplasty.;  Surgeon: Sherren Kerns, MD;  Location: [facility] OR;  Service: Vascular;  Laterality: Left;   HEMATOMA EVACUATION Left 08/27/2020   Procedure: EVACUATION HEMATOMA LEFT ARM;  Surgeon: Sherren Kerns, MD;  Location: [facility] OR;  Service: Vascular;  Laterality: Left;   IR REMOVAL TUN CV CATH W/O FL  09/09/2022   IR THORACENTESIS ASP PLEURAL SPACE W/IMG GUIDE  08/22/2017   1.2 L -right-sided   IR THORACENTESIS ASP PLEURAL SPACE W/IMG GUIDE  10/19/2017   KIDNEY TRANSPLANT     REVISON OF ARTERIOVENOUS FISTULA Left 12/21/2017   Procedure: PLICATION and Ligation of F LEFT ARM ARTERIOVENOUS FISTULA;  Surgeon: Sherren Kerns, MD;  Location: [facility] OR;  Service: Vascular;  Laterality: Left;   REVISON OF ARTERIOVENOUS FISTULA Left 07/16/2020   Procedure: EXCISION OF LEFT ARM ARTERIOVENOUS FISTULA;  Surgeon: Sherren Kerns, MD;  Location: [facility] OR;  Service: Vascular;  Laterality: Left;   stent in kidneys     dec 2017   TRANSTHORACIC ECHOCARDIOGRAM  09/22/2017    Severe LVH.  Normal function -EF 55-60%.  GRII  DD.  Moderate RV dilation with mildly reduced RV function.   Fixed right coronary cusp with very mild aortic stenosis.  The myocardium has a speckled appearance --> .   Recommend cardiac MRI to evaluate for amyloid   UPPER EXTREMITY VENOGRAPHY Bilateral 06/01/2020   Procedure: UPPER EXTREMITY VENOGRAPHY;  Surgeon: Chuck Hint, MD;  Location: [facility] INVASIVE CV LAB;  Service: Cardiovascular;  Laterality: Bilateral;    Social History   Tobacco Use  Smoking Status Never  Smokeless Tobacco Never    Social History   Substance and Sexual Activity  Alcohol Use No    Social History   Socioeconomic History   Marital  status: Divorced    Spouse name: Not on file   Number of children: Not on file   Years of education: Not on file   Highest education level: Not on file  Occupational History   Not on file  Tobacco Use   Smoking status: Never   Smokeless tobacco: Never  Vaping Use   Vaping Use: Never used  Substance and Sexual Activity   Alcohol use: No   Drug use: No   Sexual activity: Not Currently    Birth control/protection: None  Other Topics Concern   Not on file  Social History Narrative   Divorced   Does Mudlogger work   3 years of college.   Does not drink, does not smoke.  Does not use illicit drugs.   Son in nursing school at [employer]   Social Determinants of Health   Financial Resource Strain: Not on file  Food Insecurity: No Food Insecurity (09/11/2022)   Hunger Vital Sign    Worried About Running Out of Food in the Last Year: Never true    Ran Out of Food in the Last Year: Never true  Transportation Needs: No Transportation Needs (09/11/2022)   PRAPARE - Administrator, Civil Service (Medical): No    Lack of Transportation (Non-Medical): No  Physical Activity: Not on file  Stress: Not on file  Social Connections: Not on file  Intimate Partner Violence: Not At Risk (09/11/2022)   Humiliation, Afraid, Rape, and Kick questionnaire    Fear of Current or Ex-Partner: No    Emotionally Abused: No    Physically Abused: No    Sexually Abused: No    Allergies  Allergen Reactions   Azithromycin Nausea And Vomiting and Other (See Comments)    Chest tightness  Other Reaction(s): GI Intolerance   Iodinated Contrast Media Itching and Nausea Only    Probably needs prednisone prep prior to contrast    Current Facility-Administered Medications  Medication Dose Route Frequency Provider Last Rate Last Admin   acetaminophen (TYLENOL) tablet 650 mg  650 mg Oral Q6H PRN Opyd, Lavone Neri, MD       Or   acetaminophen (TYLENOL) suppository 650 mg  650 mg Rectal Q6H PRN  Opyd, Lavone Neri, MD       atenolol (TENORMIN) tablet 50 mg  50 mg Oral Daily Jonah Blue, MD   50 mg at 09/11/22 1231   ceFAZolin (ANCEF) IVPB 1 g/50 mL premix  1 g Intravenous q1800 Rexford Maus, RPH 100 mL/hr at 09/11/22 1800 1 g at 09/11/22 1800   Chlorhexidine Gluconate Cloth 2 % PADS 6 each  6 each Topical Daily Berton Mount I, MD   6 each at 09/11/22 1806   chlorproMAZINE (THORAZINE) 12.5 mg in sodium chloride 0.9 % 25 mL IVPB  12.5 mg Intravenous  Q6H PRN Jonah Blue, MD 50 mL/hr at 09/12/22 0000 12.5 mg at 09/12/22 0000   cinacalcet (SENSIPAR) tablet 30 mg  30 mg Oral Q supper Jonah Blue, MD   30 mg at 09/11/22 1800   guaiFENesin (ROBITUSSIN) 100 MG/5ML liquid 5 mL  5 mL Oral Q4H PRN Jonah Blue, MD   5 mL at 09/10/22 0544   HYDROmorphone (DILAUDID) injection 0.5-1 mg  0.5-1 mg Intravenous Q3H PRN Opyd, Lavone Neri, MD   1 mg at 09/12/22 1108   lip balm (CARMEX) ointment   Topical PRN Marinda Elk, MD       methocarbamol (ROBAXIN) 500 mg in dextrose 5 % 50 mL IVPB  500 mg Intravenous Q6H PRN Jonah Blue, MD 110 mL/hr at 09/11/22 2229 500 mg at 09/11/22 2229   multivitamin (RENA-VIT) tablet 1 tablet  1 tablet Oral Noemi Chapel, MD   1 tablet at 09/09/22 2142   ondansetron (ZOFRAN) tablet 4 mg  4 mg Oral Q6H PRN Opyd, Lavone Neri, MD   4 mg at 09/10/22 1056   Or   ondansetron (ZOFRAN) injection 4 mg  4 mg Intravenous Q6H PRN Opyd, Lavone Neri, MD       oxyCODONE (Oxy IR/ROXICODONE) immediate release tablet 5 mg  5 mg Oral Q4H PRN Opyd, Lavone Neri, MD   5 mg at 09/10/22 1056   pantoprazole (PROTONIX) EC tablet 40 mg  40 mg Oral Daily Bufford Buttner, MD   40 mg at 09/11/22 1231   senna (SENOKOT) tablet 8.6 mg  1 tablet Oral Daily PRN Opyd, Lavone Neri, MD       sodium chloride flush (NS) 0.9 % injection 3 mL  3 mL Intravenous Q12H Opyd, Lavone Neri, MD   3 mL at 09/11/22 1433   sodium zirconium cyclosilicate (LOKELMA) packet 10 g  10 g Oral Daily Bufford Buttner, MD         Family History  Problem Relation Age of Onset   Hypertension Mother    Heart disease Mother 80       By his report, he thinks that she had heart attack.   Stroke Mother    Cancer Father    Kidney cancer Father    Hypertension Sister    Heart disease Maternal Grandmother    Alcohol abuse Maternal Grandfather    Mental illness Paternal Grandmother    Learning disabilities Paternal Grandmother        Alzheimer's    Stroke Paternal Grandfather    Colon cancer Neg Hx    Colon polyps Neg Hx    Esophageal cancer Neg Hx    Rectal cancer Neg Hx    Stomach cancer Neg Hx        Physical Exam: BP 110/68 (BP Location: Left Arm)   Pulse 74   Temp 98.4 F (36.9 C) (Oral)   Resp 18   Ht 5\' 9"  (1.753 m)   Wt 81 kg   SpO2 98%   BMI 26.37 kg/m      Diagnostic Studies & Laboratory data: I have personally reviewed the following studies and agree with the findings     Recent Radiology Findings:   DG Abd Portable 1V-Small Bowel Obstruction Protocol-initial, 8 hr delay  Result Date: 09/11/2022 CLINICAL DATA:  Evaluate small-bowel obstruction EXAM: PORTABLE ABDOMEN - 1 VIEW COMPARISON:  Film from earlier in the same day. FINDINGS: Multiple dilated loops of small bowel are again identified. Previously administered contrast lies within the stomach although has  also passed distally into the colon consistent with a partial small bowel obstruction. Right ureteral stent is noted. IMPRESSION: Persistent small bowel dilatation with contrast now seen in the colon consistent with partial small bowel obstruction. Electronically Signed   By: Alcide Clever M.D.   On: 09/11/2022 22:36   ABORTED INVASIVE LAB PROCEDURE  Result Date: 09/11/2022 This case was aborted.  DG Abd 1 View  Result Date: 09/11/2022 CLINICAL DATA:  Small-bowel obstruction EXAM: ABDOMEN - 1 VIEW COMPARISON:  KUB and CT abdomen/pelvis 1 day prior FINDINGS: Gaseous distention of small bowel in the midabdomen  measuring up to 5.2 cm is not significantly changed. There is no definite free intraperitoneal air, within the confines of supine technique. There is no abnormal soft tissue calcification. A ureteral stent is noted in the right pelvis. There is no acute osseous abnormality. IMPRESSION: Similar gaseous distention of the small bowel compared to the study from 1 day prior. Electronically Signed   By: Lesia Hausen M.D.   On: 09/11/2022 08:19   CT ABDOMEN PELVIS WO CONTRAST  Result Date: 09/10/2022 CLINICAL DATA:  Bowel obstruction suspected EXAM: CT ABDOMEN AND PELVIS WITHOUT CONTRAST TECHNIQUE: Multidetector CT imaging of the abdomen and pelvis was performed following the standard protocol without IV contrast. RADIATION DOSE REDUCTION: This exam was performed according to the departmental dose-optimization program which includes automated exposure control, adjustment of the mA and/or kV according to patient size and/or use of iterative reconstruction technique. COMPARISON:  Abdominal radiographs 09/10/2022 and CT chest abdomen and pelvis 09/07/2022 FINDINGS: Lower chest: Multiple new pulmonary nodules in the right lower lobe measuring up to 17 mm which were not present on 09/07/2022. Given rapid development these are infectious/inflammatory. The previously seen 6 mm nodule in the left lower lobe has resolved. Hepatobiliary: Mild hepatic steatosis. Decompressed gallbladder. No biliary dilation. Pancreas: Unremarkable. Spleen: Unremarkable. Adrenals/Urinary Tract: Normal adrenal glands. Atrophic native kidneys. No urinary calculi or hydronephrosis. 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 A ureteral stent again extends from the transplant kidney into the bladder. Irregular bladder wall thickening. Stomach/Bowel: Diffuse dilation of the small bowel in the anterior abdomen and pelvis there is abrupt transition point in the right anterior abdomen (series 6/image 37  and series 3/image 55) in an area of mild wall thickening. Enteric contrast is present within the small bowel reaching a point in the left lower quadrant where there is moderate dilation and wall thickening of the small bowel (circa series 3/image 64). Stomach is within normal limits. Normal caliber colon. Colonic diverticulosis without diverticulitis. Normal appendix. Vascular/Lymphatic: Aortic atherosclerosis. No enlarged abdominal or pelvic lymph nodes. Reproductive: Unremarkable. Other: No free intraperitoneal fluid or air. Fat containing left inguinal hernia. Musculoskeletal: No acute osseous abnormality or destructive osseous lesion. Dense bones consistent with renal osteodystrophy. Lucency in the L2 vertebral body are stable since 2020 consistent with Schmorl's nodes or changes of hyperparathyroidism. IMPRESSION: 1. Small-bowel obstruction with transition point in the right anterior abdomen in an area of mild wall thickening. 2. Enteric contrast is present within the small bowel reaching a point in the left lower quadrant where there is moderate dilation and apparent wall thickening. Wall thickening may be due to infectious or inflammatory enteritis however neoplastic infiltration is not excluded. Continued attention on follow-up. 3. Multiple new pulmonary nodules in the right lower lobe measuring up to 17 mm which were not present on 09/07/2022. Given rapid development these are compatible with infection and/or aspiration. 4. Irregular bladder  wall thickening. Recommend correlation with urinalysis. Aortic Atherosclerosis (ICD10-I70.0). Electronically Signed   By: Minerva Fester M.D.   On: 09/10/2022 19:56      Recent Lab Findings: Lab Results  Component Value Date   WBC 28.5 (H) 09/11/2022   HGB 10.9 (L) 09/11/2022   HCT 32.3 (L) 09/11/2022   PLT 157 09/11/2022   GLUCOSE 134 (H) 09/11/2022   CHOL 162 06/10/2017   TRIG 117.0 06/10/2017   HDL 56.00 06/10/2017   LDLCALC 83 06/10/2017   ALT 58  (H) 09/08/2022   AST 82 (H) 09/08/2022   NA 129 (L) 09/11/2022   K 4.7 09/11/2022   CL 84 (L) 09/11/2022   CREATININE 10.43 (H) 09/11/2022   BUN 105 (H) 09/11/2022   CO2 22 09/11/2022   INR 1.4 (H) 09/10/2022      Assessment / Plan:     61 yo with MSSA endocarditis of MV with no evidence of embolic issues after starting antibiotics, with mass on anterior leaflet which by my interpretation appears to be the involvement of the anterior leaflet and not just a mass on it. No evidence of valve dysfunction or CHF. Pt with normal LV function. I would agree that at this point no indication for surgical intervention which will require valve replacement (mechanical) and therefore would continue management with antibiotics and reconsider further work up with TEE/Cath if ongoing sepsis, embolic occurrence on antibiotics, valve failure and CHF occur but to plan on reevaluating with TTE in one week to follow mass.   I have spent 60 min in review of the records, viewing studies and in face to face with patient and in coordination of future care    Eugenio Hoes 09/12/2022 2:44 PM

## 2022-09-13 DIAGNOSIS — M4647 Discitis, unspecified, lumbosacral region: Secondary | ICD-10-CM | POA: Diagnosis not present

## 2022-09-13 DIAGNOSIS — K5669 Other partial intestinal obstruction: Secondary | ICD-10-CM | POA: Diagnosis not present

## 2022-09-13 DIAGNOSIS — R197 Diarrhea, unspecified: Secondary | ICD-10-CM | POA: Diagnosis not present

## 2022-09-13 LAB — RENAL FUNCTION PANEL
Albumin: 1.9 g/dL — ABNORMAL LOW (ref 3.5–5.0)
Anion gap: 17 — ABNORMAL HIGH (ref 5–15)
BUN: 59 mg/dL — ABNORMAL HIGH (ref 8–23)
CO2: 22 mmol/L (ref 22–32)
Calcium: 8 mg/dL — ABNORMAL LOW (ref 8.9–10.3)
Chloride: 92 mmol/L — ABNORMAL LOW (ref 98–111)
Creatinine, Ser: 7.83 mg/dL — ABNORMAL HIGH (ref 0.61–1.24)
GFR, Estimated: 7 mL/min — ABNORMAL LOW (ref >60)
Glucose, Bld: 120 mg/dL — ABNORMAL HIGH (ref 70–99)
Phosphorus: 6.2 mg/dL — ABNORMAL HIGH (ref 2.5–4.6)
Potassium: 4.6 mmol/L (ref 3.5–5.1)
Sodium: 131 mmol/L — ABNORMAL LOW (ref 135–145)

## 2022-09-13 LAB — CULTURE, BLOOD (ROUTINE X 2)
Culture: NO GROWTH
Culture: NO GROWTH
Special Requests: ADEQUATE
Special Requests: ADEQUATE

## 2022-09-13 LAB — CBC
HCT: 29.7 % — ABNORMAL LOW (ref 39.0–52.0)
Hemoglobin: 10.3 g/dL — ABNORMAL LOW (ref 13.0–17.0)
MCH: 28.3 pg (ref 26.0–34.0)
MCHC: 34.7 g/dL (ref 30.0–36.0)
MCV: 81.6 fL (ref 80.0–100.0)
Platelets: 256 10*3/uL (ref 150–400)
RBC: 3.64 MIL/uL — ABNORMAL LOW (ref 4.22–5.81)
RDW: 14.9 % (ref 11.5–15.5)
WBC: 27 10*3/uL — ABNORMAL HIGH (ref 4.0–10.5)
nRBC: 0.1 % (ref 0.0–0.2)

## 2022-09-13 MED ORDER — GABAPENTIN 100 MG PO CAPS
100.0000 mg | ORAL_CAPSULE | Freq: Once | ORAL | Status: AC
  Administered 2022-09-13: 100 mg via ORAL
  Filled 2022-09-13: qty 1

## 2022-09-13 NOTE — Progress Notes (Signed)
PROGRESS NOTE    Ryan [name]  MWU:[id number] DOB: March 16, 1961 DOA: 09/07/2022 PCP: Sharin Grave, MD   Brief Narrative: Ryan [name] is a 62 y.o. male with a history of hypertension, hyperlipidemia, ESRD on hemodialysis.  Patient presented secondary to low back pain and was found to have evidence of L5-S1 discitis/osteomyelitis in addition to MSSA bacteremia.  ID consulted.  Workup significant for possible endocarditis. Patient also found to have a small bowel obstruction.   Assessment/Plan:  L5-S1 discitis/osteomyelitis MSSA bacteremia Confirmed possible early discitis/osteomyelitis on MRI imaging.  Blood cultures positive for MSSA.  Patient was empirically treated with vancomycin and cefepime.  Infectious disease consulted.  Patient transition to cefazolin IV with hemodialysis.  Transthoracic echocardiogram significant for a large, rounded structure on the anterior leaflet of the mitral valve concerning for endocarditis.  ID recommending transesophageal echocardiogram; cardiology consulted. Transesophageal Echocardiogram deferred secondary to risk/benefit profile. 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 -Infectious disease recommendations: Cefazolin IV with hemodialysis with plan for 6 weeks of treatment from 6/18  ESRD on hemodialysis Nephrology consulted.  Patient presented with a tunneled dialysis catheter which was removed on 6/18 secondary to bacteremia.  Per ID, patient requires a 72-hour line holiday.  Patient last received hemodialysis on 6/18. Interventional radiology consulted and placed non-tunneled HD cath on 6/21. Hemodialysis resumed. -Nephrology recommendations: pending today -IR recommendations (6/21): recommend coordinating with the IR service for sterioid prep prior to attempted left internal jugular approach HD catheter placement versus conversion of right CFV temp catheter to tunneled HD  catheter.   Intractable hiccups Patient started on Thorazine as needed. Possibly related to uremia. Recurrent. -Gabapentin 100 mg x1  Small bowel obstruction Patient without a bowel movement for several days initially.  Diagnosis confirmed on CT scan with transition point located in the right anterior abdomen.  General surgery consulted. Patient underwent SBO protocol and gastrografin made it to his colon suggesting at most a partial small bowel obstruction at that time, although per general surgery, possible ileus related to possible gastroenteritis. Symptoms appear to be resolved. -General surgery recommendations: clear liquid diet, recommendations pending today -Limit narcotics  Primary hypertension -Continue atenolol  Elevated troponin Previously noted. No associated chest pain. Presumed secondary to demand ischemia.  Lung nodules Noted on initial CT scan.  Initial recommendation for repeat CT scan of the chest in 18 to 24 months.  Repeat CT scan on 6/20 showed multiple new pulmonary nodules compatible with likely active infection and/or aspiration.  Patient without symptoms to suggest pneumonia although it is likely these nodules are septic in etiology.  Bladder wall thickening Noted incidentally on CT imaging.  No urinary symptoms and patient does not make urine secondary to ESRD. Unclear if this correlates with isolated infection or is related to overall infectious picture.   DVT prophylaxis: SCDs Code Status:   Code Status: Full Code Family Communication: None at bedside Disposition Plan: Discharge home pending completion of endocarditis workup/management, and outpatient antibiotic regimen and hemodialysis recommendations   Consultants:  Infectious disease Nephrology Interventional radiology Cardiothoracic surgery  Procedures:  Hemodialysis 6/18: Successful removal of tunneled hemodialysis catheter  6/21: Successful placement of a non-tunneled HD catheter via the right  CFV with tips terminating within the inferior aspect of the IVC   Antimicrobials: Vancomycin IV Flagyl IV Cefepime IV Cefazolin IV   Subjective: Some back pain. Losing time.   Objective: BP 109/70 (BP Location: Left Arm)   Pulse 84   Temp 98.6 F (  37 C) (Oral)   Resp 18   Ht 5\' 9"  (1.753 m)   Wt 81.2 kg   SpO2 94%   BMI 26.44 kg/m   Examination:  General exam: Appears calm and comfortable Respiratory system: Clear to auscultation. Respiratory effort normal. Cardiovascular system: S1 & S2 heard, RRR. Systolic murmur Gastrointestinal system: Abdomen is nondistended, soft and nontender. No organomegaly or masses felt. Normal bowel sounds heard. Central nervous system: Alert and oriented to person and place. Musculoskeletal: No edema. No calf tenderness Skin: No cyanosis. No rashes  Data Reviewed: I have personally reviewed following labs and imaging studies   Last CBC Lab Results  Component Value Date   WBC 27.0 (H) 09/13/2022   HGB 10.3 (L) 09/13/2022   HCT 29.7 (L) 09/13/2022   MCV 81.6 09/13/2022   MCH 28.3 09/13/2022   RDW 14.9 09/13/2022   PLT 256 09/13/2022     Last metabolic panel Lab Results  Component Value Date   GLUCOSE 119 (H) 09/12/2022   NA 129 (L) 09/12/2022   K 5.4 (H) 09/12/2022   CL 86 (L) 09/12/2022   CO2 18 (L) 09/12/2022   BUN 135 (H) 09/12/2022   CREATININE 13.36 (H) 09/12/2022   GFRNONAA 4 (L) 09/12/2022   CALCIUM 8.6 (L) 09/12/2022   PHOS 8.2 (H) 09/12/2022   PROT 8.0 09/08/2022   ALBUMIN 2.0 (L) 09/12/2022   BILITOT 0.9 09/08/2022   ALKPHOS 106 09/08/2022   AST 82 (H) 09/08/2022   ALT 58 (H) 09/08/2022   ANIONGAP 25 (H) 09/12/2022     Creatinine Clearance: Estimated Creatinine Clearance: 5.8 mL/min (A) (by C-G formula based on SCr of 13.36 mg/dL (H)).  Recent Results (from the past 240 hour(s))  Blood culture (routine x 2)     Status: Abnormal   Collection Time: 09/07/22  2:15 AM   Specimen: BLOOD  Result Value Ref  Range Status   Specimen Description   Final    BLOOD LEFT ANTECUBITAL Performed at [facility], [address] [place], [place] [address]    Special Requests   Final    BOTTLES DRAWN AEROBIC AND ANAEROBIC Blood Culture adequate volume Performed at [facility], [address] [place], [place] [address]    Culture  Setup Time   Final    GRAM POSITIVE COCCI IN BOTH AEROBIC AND ANAEROBIC BOTTLES CRITICAL VALUE NOTED.  VALUE IS CONSISTENT WITH PREVIOUSLY REPORTED AND CALLED VALUE.    Culture (A)  Final    STAPHYLOCOCCUS AUREUS SUSCEPTIBILITIES PERFORMED ON PREVIOUS CULTURE WITHIN THE LAST 5 DAYS. Performed at [facility], [address], [place], [place] [address]    Report Status 09/09/2022 FINAL  Final  Blood culture (routine x 2)     Status: Abnormal   Collection Time: 09/07/22  2:51 AM   Specimen: BLOOD  Result Value Ref Range Status   Specimen Description   Final    BLOOD BLOOD RIGHT HAND Performed at [facility], [address] [place], [place] [address]    Special Requests   Final    BOTTLES DRAWN AEROBIC AND ANAEROBIC Blood Culture adequate volume Performed at [facility], [address] [place], [place] [address]    Culture  Setup Time   Final    GRAM POSITIVE COCCI IN BOTH AEROBIC AND ANAEROBIC BOTTLES CRITICAL RESULT CALLED TO, READ BACK BY AND VERIFIED WITH: PHARMD NICK GLOGOVAC [id number] 1503 BY EC Performed at [facility], [address] [place], [place] [address]    Culture STAPHYLOCOCCUS AUREUS (A)  Final   Report Status 09/09/2022 FINAL  Final   Organism ID, Bacteria STAPHYLOCOCCUS AUREUS  Final      Susceptibility   Staphylococcus aureus - MIC*    CIPROFLOXACIN <=0.5 SENSITIVE Sensitive     ERYTHROMYCIN <=0.25 SENSITIVE Sensitive     GENTAMICIN <=0.5 SENSITIVE Sensitive     OXACILLIN 0.5 SENSITIVE Sensitive     TETRACYCLINE <=1 SENSITIVE Sensitive      VANCOMYCIN 1 SENSITIVE Sensitive     TRIMETH/SULFA <=10 SENSITIVE Sensitive     CLINDAMYCIN <=0.25 SENSITIVE Sensitive     RIFAMPIN <=0.5 SENSITIVE Sensitive     Inducible Clindamycin NEGATIVE Sensitive     LINEZOLID 2 SENSITIVE Sensitive     * STAPHYLOCOCCUS AUREUS  Blood Culture ID Panel (Reflexed)     Status: Abnormal   Collection Time: 09/07/22  2:51 AM  Result Value Ref Range Status   Enterococcus faecalis NOT DETECTED NOT DETECTED Final   Enterococcus Faecium NOT DETECTED NOT DETECTED Final   Listeria monocytogenes NOT DETECTED NOT DETECTED Final   Staphylococcus species DETECTED (A) NOT DETECTED Final    Comment: CRITICAL RESULT CALLED TO, READ BACK BY AND VERIFIED WITH: PHARMD NICK GLOGOVAC [id number] AT 1503 BY EC    Staphylococcus aureus (BCID) DETECTED (A) NOT DETECTED Final    Comment: CRITICAL RESULT CALLED TO, READ BACK BY AND VERIFIED WITH: PHARMD NICK GLOGOVAC [id number] AT 1503 BY EC    Staphylococcus epidermidis NOT DETECTED NOT DETECTED Final   Staphylococcus lugdunensis NOT DETECTED NOT DETECTED Final   Streptococcus species NOT DETECTED NOT DETECTED Final   Streptococcus agalactiae NOT DETECTED NOT DETECTED Final   Streptococcus pneumoniae NOT DETECTED NOT DETECTED Final   Streptococcus pyogenes NOT DETECTED NOT DETECTED Final   A.calcoaceticus-baumannii NOT DETECTED NOT DETECTED Final   Bacteroides fragilis NOT DETECTED NOT DETECTED Final   Enterobacterales NOT DETECTED NOT DETECTED Final   Enterobacter cloacae complex NOT DETECTED NOT DETECTED Final   Escherichia coli NOT DETECTED NOT DETECTED Final   Klebsiella aerogenes NOT DETECTED NOT DETECTED Final   Klebsiella oxytoca NOT DETECTED NOT DETECTED Final   Klebsiella pneumoniae NOT DETECTED NOT DETECTED Final   Proteus species NOT DETECTED NOT DETECTED Final   Salmonella species NOT DETECTED NOT DETECTED Final   Serratia marcescens NOT DETECTED NOT DETECTED Final   Haemophilus influenzae NOT DETECTED NOT  DETECTED Final   Neisseria meningitidis NOT DETECTED NOT DETECTED Final   Pseudomonas aeruginosa NOT DETECTED NOT DETECTED Final   Stenotrophomonas maltophilia NOT DETECTED NOT DETECTED Final   Candida albicans NOT DETECTED NOT DETECTED Final   Candida auris NOT DETECTED NOT DETECTED Final   Candida glabrata NOT DETECTED NOT DETECTED Final   Candida krusei NOT DETECTED NOT DETECTED Final   Candida parapsilosis NOT DETECTED NOT DETECTED Final   Candida tropicalis NOT DETECTED NOT DETECTED Final   Cryptococcus neoformans/gattii NOT DETECTED NOT DETECTED Final   Meth resistant mecA/C and MREJ NOT DETECTED NOT DETECTED Final    Comment: Performed at [facility], [address], [place], [place] [address]  Respiratory (~20 pathogens) panel by PCR     Status: None   Collection Time: 09/07/22  5:45 AM   Specimen: Anterior Nasal Swab; Respiratory  Result Value Ref Range Status   Adenovirus NOT DETECTED NOT DETECTED Final   Coronavirus 229E NOT DETECTED NOT DETECTED Final    Comment: (NOTE) The Coronavirus on the Respiratory Panel,  DOES NOT test for the novel  Coronavirus (2019 nCoV)    Coronavirus HKU1 NOT DETECTED NOT DETECTED Final   Coronavirus NL63 NOT DETECTED NOT DETECTED Final   Coronavirus OC43 NOT DETECTED NOT DETECTED Final   Metapneumovirus NOT DETECTED NOT DETECTED Final   Rhinovirus / Enterovirus NOT DETECTED NOT DETECTED Final   Influenza A NOT DETECTED NOT DETECTED Final   Influenza B NOT DETECTED NOT DETECTED Final   Parainfluenza Virus 1 NOT DETECTED NOT DETECTED Final   Parainfluenza Virus 2 NOT DETECTED NOT DETECTED Final   Parainfluenza Virus 3 NOT DETECTED NOT DETECTED Final   Parainfluenza Virus 4 NOT DETECTED NOT DETECTED Final   Respiratory Syncytial Virus NOT DETECTED NOT DETECTED Final   Bordetella pertussis NOT DETECTED NOT DETECTED Final   Bordetella Parapertussis NOT DETECTED NOT DETECTED Final   Chlamydophila pneumoniae NOT DETECTED NOT DETECTED Final    Mycoplasma pneumoniae NOT DETECTED NOT DETECTED Final    Comment: Performed at [facility], [address], [place], [place] [address]  SARS Coronavirus 2 by RT PCR (hospital order, performed in [facility] hospital lab) *cepheid single result test* Anterior Nasal Swab     Status: None   Collection Time: 09/07/22  5:45 AM   Specimen: Anterior Nasal Swab  Result Value Ref Range Status   SARS Coronavirus 2 by RT PCR NEGATIVE NEGATIVE Final    Comment: (NOTE) SARS-CoV-2 target nucleic acids are NOT DETECTED.  The SARS-CoV-2 RNA is generally detectable in upper and lower respiratory specimens during the acute phase of infection. The lowest concentration of SARS-CoV-2 viral copies this assay can detect is 250 copies / mL. A negative result does not preclude SARS-CoV-2 infection and should not be used as the sole basis for treatment or other patient management decisions.  A negative result may occur with improper specimen collection / handling, submission of specimen other than nasopharyngeal swab, presence of viral mutation(s) within the areas targeted by this assay, and inadequate number of viral copies (<250 copies / mL). A negative result must be combined with clinical observations, patient history, and epidemiological information.  Fact Sheet for Patients:   RoadLapTop.co.za  Fact Sheet for Healthcare Providers: http://kim-miller.com/  This test is not yet approved or  cleared by the [place] FDA and has been authorized for detection and/or diagnosis of SARS-CoV-2 by FDA under an Emergency Use Authorization (EUA).  This EUA will remain in effect (meaning this test can be used) for the duration of the COVID-19 declaration under Section 564(b)(1) of the Act, 21 U.S.C. section 360bbb-3(b)(1), unless the authorization is terminated or revoked sooner.  Performed at [facility], [address], [place], [place] [address]   Culture, blood (Routine X 2) w Reflex to ID Panel     Status: None   Collection Time: 09/08/22 11:28 AM   Specimen: BLOOD LEFT HAND  Result Value Ref Range Status   Specimen Description BLOOD LEFT HAND  Final   Special Requests   Final    BOTTLES DRAWN AEROBIC AND ANAEROBIC Blood Culture adequate volume   Culture   Final    NO GROWTH 5 DAYS Performed at [facility], [address] [place], [place] [address]    Report Status 09/13/2022 FINAL  Final  Culture, blood (Routine X 2) w Reflex to ID Panel     Status: None   Collection Time: 09/08/22 11:28 AM   Specimen: BLOOD RIGHT HAND  Result Value Ref Range Status  Specimen Description BLOOD RIGHT HAND  Final   Special Requests   Final    BOTTLES DRAWN AEROBIC AND ANAEROBIC Blood Culture adequate volume   Culture   Final    NO GROWTH 5 DAYS Performed at [facility], [address], [place], [place] [address]    Report Status 09/13/2022 FINAL  Final  Culture, blood (Routine X 2) w Reflex to ID Panel     Status: None (Preliminary result)   Collection Time: 09/10/22  3:16 PM   Specimen: BLOOD  Result Value Ref Range Status   Specimen Description BLOOD RIGHT ANTECUBITAL  Final   Special Requests   Final    BOTTLES DRAWN AEROBIC AND ANAEROBIC Blood Culture adequate volume   Culture   Final    NO GROWTH 3 DAYS Performed at [facility], [address], [place], [place] [address]    Report Status PENDING  Incomplete  Culture, blood (Routine X 2) w Reflex to ID Panel     Status: None (Preliminary result)   Collection Time: 09/10/22  3:16 PM   Specimen: BLOOD LEFT ARM  Result Value Ref Range Status   Specimen Description BLOOD LEFT ARM  Final   Special Requests   Final    BOTTLES DRAWN AEROBIC AND ANAEROBIC Blood Culture adequate volume   Culture   Final    NO GROWTH 3 DAYS Performed at [facility], [address] [place] [address] [place], [place] [address]    Report Status PENDING   Incomplete  MRSA Next Gen by PCR, Nasal     Status: None   Collection Time: 09/11/22  9:11 AM   Specimen: Nasal Mucosa; Nasal Swab  Result Value Ref Range Status   MRSA by PCR Next Gen NOT DETECTED NOT DETECTED Final    Comment: (NOTE) The GeneXpert MRSA Assay (FDA approved for NASAL specimens only), is one component of a comprehensive MRSA colonization surveillance program. It is not intended to diagnose MRSA infection nor to guide or monitor treatment for MRSA infections. Test performance is not FDA approved in patients less than 15 years old. Performed at [facility], [address], [place], [place] [address]       Radiology Studies: IR Fluoro Guide CV Line Right  Result Date: 09/12/2022 INDICATION: History of end-stage renal disease, now admitted with bacteremia, post removal of tunneled right internal jugular approach dialysis catheter on 09/09/2022. Unfortunately, patient has white blood cell count remains persistently elevated and as such she presents today for image guided placement of a temporary hemodialysis catheter to facilitate continued dialysis. Patient with history of contrast allergy though is not received a preprocedural steroid prep. EXAM: NON-TUNNELED CENTRAL VENOUS HEMODIALYSIS CATHETER PLACEMENT WITH ULTRASOUND AND FLUOROSCOPIC GUIDANCE COMPARISON:  Dialysis catheter removal-09/09/2022; chest radiograph-12/29/2017 MEDICATIONS: None FLUOROSCOPY TIME:  3 minutes, 18 seconds (123 mGy) COMPLICATIONS: None immediate. PROCEDURE: Informed written consent was obtained from the patient after a discussion of the risks, benefits, and alternatives to treatment. Questions regarding the procedure were encouraged and answered. The right neck and chest were prepped with chlorhexidine in a sterile fashion, and a sterile drape was applied covering the operative field. Maximum barrier sterile technique with sterile gowns and gloves were used for the procedure. A timeout was performed  prior to the initiation of the procedure. After the overlying soft tissues were anesthetized, a small venotomy incision was created and a micropuncture kit was utilized to access the internal jugular vein. Real-time ultrasound guidance was utilized for  vascular access including the acquisition of a permanent ultrasound image documenting patency of the accessed vessel. There was difficulty advancing the microwire through the SVC. As such, stiff glidewire was cannulated through the outer sheath of the micropuncture kit however again, could not be advanced to the SVC. Given patient's history of contrast allergy, intravenous contrast was not administered. As such, the decision was made to proceed with right common femoral vein approach temporary dialysis catheter placement to facilitate dialysis. The right groin was prepped and draped in usual sterile fashion. The right femoral head was marked fluoroscopically. Under direct ultrasound guidance, the right common femoral vein was accessed with a micropuncture kit after the overlying soft tissues were anesthetized with 1% lidocaine within epinephrine. An ultrasound image was saved documenting patency of the accessed vessel. Under fluoroscopic guidance, the track was dilated ultimately allowing placement of a The microwire was utilized to measure appropriate catheter length. A stiff glidewire was advanced to the level of the IVC. Under fluoroscopic guidance, the venotomy was serially dilated, ultimately allowing placement of a 24 cm temporary [place] dialysis catheter with tip ultimately terminating within the inferior aspect of the IVC. Final catheter positioning was confirmed and documented with a spot fluoroscopic image. The catheter aspirates and flushes normally. The catheter was flushed with appropriate volume heparin dwells. The catheter exit site was secured with a 0-Prolene retention suture. A dressing was placed. The patient tolerated the procedure well without  immediate post procedural complication. IMPRESSION: 1. Attempted though ultimately unsuccessful placement of right internal jugular approach temporary hemodialysis catheter secondary to concern for SVC occlusion. Contrast was unable to be administered secondary to patient's history of contrast allergy and lack of preprocedural steroid prep. 2. Successful placement of a right common femoral vein approach 24 cm temporary dialysis catheter with tip terminating within the inferior aspect of the IVC. The catheter is ready for immediate use. PLAN: Recommend correlating with interventional radiology for once the patient's bacteremia has cleared, the patient will need a steroid prep prior to attempted left internal jugular approach hemodialysis catheter placement as this procedure will likely require the administration of contrast. Ultimately, if the patient's SVC is found to be occluded and unable to be traversed, the right common femoral vein dialysis catheter may be converted to a tunneled catheter as indicated. Electronically Signed   By: Simonne Come M.D.   On: 09/12/2022 16:16   IR US Guide Vasc Access Right  Result Date: 09/12/2022 INDICATION: History of end-stage renal disease, now admitted with bacteremia, post removal of tunneled right internal jugular approach dialysis catheter on 09/09/2022. Unfortunately, patient has white blood cell count remains persistently elevated and as such she presents today for image guided placement of a temporary hemodialysis catheter to facilitate continued dialysis. Patient with history of contrast allergy though is not received a preprocedural steroid prep. EXAM: NON-TUNNELED CENTRAL VENOUS HEMODIALYSIS CATHETER PLACEMENT WITH ULTRASOUND AND FLUOROSCOPIC GUIDANCE COMPARISON:  Dialysis catheter removal-09/09/2022; chest radiograph-12/29/2017 MEDICATIONS: None FLUOROSCOPY TIME:  3 minutes, 18 seconds (123 mGy) COMPLICATIONS: None immediate. PROCEDURE: Informed written consent was  obtained from the patient after a discussion of the risks, benefits, and alternatives to treatment. Questions regarding the procedure were encouraged and answered. The right neck and chest were prepped with chlorhexidine in a sterile fashion, and a sterile drape was applied covering the operative field. Maximum barrier sterile technique with sterile gowns and gloves were used for the procedure. A timeout was performed prior to the initiation of the procedure. After the overlying soft  tissues were anesthetized, a small venotomy incision was created and a micropuncture kit was utilized to access the internal jugular vein. Real-time ultrasound guidance was utilized for vascular access including the acquisition of a permanent ultrasound image documenting patency of the accessed vessel. There was difficulty advancing the microwire through the SVC. As such, stiff glidewire was cannulated through the outer sheath of the micropuncture kit however again, could not be advanced to the SVC. Given patient's history of contrast allergy, intravenous contrast was not administered. As such, the decision was made to proceed with right common femoral vein approach temporary dialysis catheter placement to facilitate dialysis. The right groin was prepped and draped in usual sterile fashion. The right femoral head was marked fluoroscopically. Under direct ultrasound guidance, the right common femoral vein was accessed with a micropuncture kit after the overlying soft tissues were anesthetized with 1% lidocaine within epinephrine. An ultrasound image was saved documenting patency of the accessed vessel. Under fluoroscopic guidance, the track was dilated ultimately allowing placement of a The microwire was utilized to measure appropriate catheter length. A stiff glidewire was advanced to the level of the IVC. Under fluoroscopic guidance, the venotomy was serially dilated, ultimately allowing placement of a 24 cm temporary [place] dialysis  catheter with tip ultimately terminating within the inferior aspect of the IVC. Final catheter positioning was confirmed and documented with a spot fluoroscopic image. The catheter aspirates and flushes normally. The catheter was flushed with appropriate volume heparin dwells. The catheter exit site was secured with a 0-Prolene retention suture. A dressing was placed. The patient tolerated the procedure well without immediate post procedural complication. IMPRESSION: 1. Attempted though ultimately unsuccessful placement of right internal jugular approach temporary hemodialysis catheter secondary to concern for SVC occlusion. Contrast was unable to be administered secondary to patient's history of contrast allergy and lack of preprocedural steroid prep. 2. Successful placement of a right common femoral vein approach 24 cm temporary dialysis catheter with tip terminating within the inferior aspect of the IVC. The catheter is ready for immediate use. PLAN: Recommend correlating with interventional radiology for once the patient's bacteremia has cleared, the patient will need a steroid prep prior to attempted left internal jugular approach hemodialysis catheter placement as this procedure will likely require the administration of contrast. Ultimately, if the patient's SVC is found to be occluded and unable to be traversed, the right common femoral vein dialysis catheter may be converted to a tunneled catheter as indicated. Electronically Signed   By: Simonne Come M.D.   On: 09/12/2022 16:16   DG Abd Portable 1V-Small Bowel Obstruction Protocol-initial, 8 hr delay  Result Date: 09/11/2022 CLINICAL DATA:  Evaluate small-bowel obstruction EXAM: PORTABLE ABDOMEN - 1 VIEW COMPARISON:  Film from earlier in the same day. FINDINGS: Multiple dilated loops of small bowel are again identified. Previously administered contrast lies within the stomach although has also passed distally into the colon consistent with a partial  small bowel obstruction. Right ureteral stent is noted. IMPRESSION: Persistent small bowel dilatation with contrast now seen in the colon consistent with partial small bowel obstruction. Electronically Signed   By: Alcide Clever M.D.   On: 09/11/2022 22:36   ABORTED INVASIVE LAB PROCEDURE  Result Date: 09/11/2022 This case was aborted.     LOS: 6 days    Jacquelin Hawking, MD [facility] 09/13/2022, 9:25 AM   If 7PM-7AM, please contact night-coverage [email or web address]

## 2022-09-13 NOTE — Progress Notes (Signed)
Occupational Therapy Treatment Patient Details Name: Ryan [name] MRN: [id number] DOB: Sep 04, 1960 Today's Date: 09/13/2022   History of present illness Pt is a 62 y/o male admitted secondary to acute back pain and found to have L5-S1 discitis/osteomyelitis, SBO and MSSA bacteremia likely 2/2 line infection. Pt scheduled for TEE 6/20. PMH including but not limited to hypertension, hyperlipidemia, ESRD.   OT comments  Pt asleep supine in bed with HOB elevated upon OT arrival. Pt woke easily and was agreeable to participation in skilled OT session. Pt continues to present with increased pain, decreased activity tolerance, lethargy, generalized weakness, and decreased safety and independence with ADLs, bed mobility in preparation for ADLs, and functional transfers/mobility. Pt declined sitting EOB or functional transfers this day secondary to pain and fatigue. OT educated pt regarding use of AE for increased safety and independence and decreased pain with ADLs. Pt demonstrated understanding of use of AE through simulated task at bed level and/or teach back. Pt will benefit from reinforcement of education. Pt states he is agreeable to plan of trailing AE sitting EOB next OT session. Pt is making slow but consistent progress toward goals. Pt progress toward goals has been limited by pain, fatigue, lethargy. Pt will benefit from continued acute skilled OT services to address deficits and increase safety and independence with ADLs, bed mobility in preparation for ADLs, and functional mobility/transfers. OT equipment recommendation updated this session. Discharge plan remains appropriate at this time. However, OT to reassess need to update discharge plan pending pt progress next OT session.    Recommendations for follow up therapy are one component of a multi-disciplinary discharge planning process, led by the attending physician.  Recommendations may be updated based on patient status, additional functional  criteria and insurance authorization.    Assistance Recommended at Discharge Frequent or constant Supervision/Assistance  Patient can return home with the following  A lot of help with walking and/or transfers;A lot of help with bathing/dressing/bathroom;Assistance with cooking/housework;Assist for transportation;Help with stairs or ramp for entrance   Equipment Recommendations  Tub/shower bench;Toilet rise with handles    Recommendations for Other Services      Precautions / Restrictions Precautions Precautions: Fall Restrictions Weight Bearing Restrictions: No       Mobility Bed Mobility Overal bed mobility: Needs Assistance Bed Mobility: Rolling Rolling: Supervision (with increased time and effort and cues for technique/hand placement)         General bed mobility comments: Pt declined supine to sit transfer this session secondary to pain and fatigue.    Transfers                   General transfer comment: Pt declined functional transfer this session secondary to pain and fatigue.     Balance                                           ADL either performed or assessed with clinical judgement   ADL Overall ADL's : Needs assistance/impaired                                       General ADL Comments: OT educated pt in use of AE for increased safety and independence with ADLs and to assist in preventing pain exacerbation with bending and twisting. Pt celined sitting  EOB this session secondary to pain and fatigue. Pt demonstrated understanding of AE in simulated tasks from bed level and/or teach back with pt stating he is agreeable to plan of trialling AE sitting EOB next OT session. OT also answered pt's general questions regarding available DME/AE for increased safety and independence in the bath/shower post acute discharge.    Extremity/Trunk Assessment Upper Extremity Assessment Upper Extremity Assessment: Generalized  weakness;RUE deficits/detail;LUE deficits/detail RUE Deficits / Details: Pt reports hx of R rotator cuff tear with subsequent weakness and decreased AROM/PROM in Right shoulder, particularly with shoulder flex/abduction. Pt presents with 2/5 MMT with limited AROM in Right shoulder and with PROM shoulder flection to approx 100 degrees. RUE Sensation: WNL RUE Coordination: WNL LUE Deficits / Details: Pt presents with general L UE MMT 3-/5 and AROM shoulder flextion to approx. 105 degrees. LUE Sensation: WNL LUE Coordination: WNL   Lower Extremity Assessment Lower Extremity Assessment: Defer to PT evaluation        Vision       Perception     Praxis      Cognition Arousal/Alertness: Lethargic Behavior During Therapy: WFL for tasks assessed/performed Overall Cognitive Status: Within Functional Limits for tasks assessed                                 General Comments: Pt with increased fatigue and lethergy and poor activity tolarance this session. Pt with occasional difficulty maintaining alertness throughout session.        Exercises      Shoulder Instructions       General Comments VSS on RA    Pertinent Vitals/ Pain       Pain Assessment Pain Assessment: 0-10 Pain Score: 7  Pain Location: back (with increased pain with movement and in sitting and standing) Pain Descriptors / Indicators: Discomfort, Aching, Grimacing, Guarding, Heaviness Pain Intervention(s): Limited activity within patient's tolerance, Monitored during session, Repositioned, Other (comment) (Pt with limited participation this day secondary to pain.)  Home Living                                          Prior Functioning/Environment              Frequency  Min 2X/week        Progress Toward Goals  OT Goals(current goals can now be found in the care plan section)  Progress towards OT goals: Progressing toward goals;OT to reassess next treatment  Acute  Rehab OT Goals Patient Stated Goal: to return home with family  Plan Equipment recommendations need to be updated;Discharge plan remains appropriate;Frequency remains appropriate;Other (comment) (Pending pt progress in next session, discharge plan may need to be updated to intensive inpatient skilled OT services < 3 hours per day secondary to pt conintued pain and decreased activity tolerance.)    Co-evaluation                 AM-PAC OT "6 Clicks" Daily Activity     Outcome Measure   Help from another person eating meals?: None Help from another person taking care of personal grooming?: A Little Help from another person toileting, which includes using toliet, bedpan, or urinal?: A Lot Help from another person bathing (including washing, rinsing, drying)?: A Lot Help from another person to put on and taking off regular upper  body clothing?: A Lot Help from another person to put on and taking off regular lower body clothing?: A Lot 6 Click Score: 15    End of Session Equipment Utilized During Treatment: Other (comment) (AE, including sock aid, reacher, dressing stick, long handled shoe horn, and long handled sponge)  OT Visit Diagnosis: Unsteadiness on feet (R26.81);Muscle weakness (generalized) (M62.81);Pain   Activity Tolerance Patient limited by fatigue;Patient limited by pain   Patient Left in bed;with call bell/phone within reach;with bed alarm set   Nurse Communication Mobility status;Other (comment) (Pt has questions regarding diet. Pt limited this session by pain and fatigue.)        Time: 4098-1191 OT Time Calculation (min): 38 min  Charges: OT General Charges $OT Visit: 1 Visit OT Treatments $Self Care/Home Management : 38-52 mins  Jilliana Burkes "Orson Eva., OTR/L, MA Acute Rehab [phone]   Lendon Colonel 09/13/2022, 5:42 PM

## 2022-09-13 NOTE — Progress Notes (Signed)
Assessment & Plan: HD #7 - SBO - CT AP with SBO with transition in R anterior abdomen in area of mild bowel wall thickening - ?infections vs inflammatory enteritis vs neoplastic infiltration - AXR with protocol shows contrast to colon consistent with partial obstruction  - pt having diarrhea this AM - clear liquid diet - continue for now   FEN: CLD   VTE: SCDs ID: [id number] for bacteremia    - per [facility] -  L5-S1 discitis/osteomyelitis  MSSA bacteremia  ESRD on HD HTN Lung nodules          Ryan Level, MD [facility] practice Office: [phone]        Chief Complaint: SBO  Subjective: Patient up to bedside commode, having loose BM's.  Tolerating clear liquid diet.  Objective: Vital signs in last 24 hours: Temp:  [97.7 F (36.5 C)-98.6 F (37 C)] 98.4 F (36.9 C) (06/22 0935) Pulse Rate:  [74-89] 84 (06/22 0935) Resp:  [18-40] 18 (06/22 0935) BP: (93-126)/(52-74) 110/69 (06/22 0935) SpO2:  [84 %-100 %] 95 % (06/22 0935) Weight:  [81.2 kg-84.2 kg] 81.2 kg (06/21 2103) Last BM Date : 09/12/22  Intake/Output from previous day: 06/21 0701 - 06/22 0700 In: 660 [P.O.:480; IV Piggyback:180] Out: 2000  Intake/Output this shift: Total I/O In: 120 [P.O.:120] Out: -   Physical Exam: Deferred  Lab Results:  Recent Labs    09/12/22 1755 09/13/22 0329  WBC 27.5* 27.0*  HGB 10.2* 10.3*  HCT 29.7* 29.7*  PLT 230 256   BMET Recent Labs    09/11/22 0743 09/12/22 1756  NA 129* 129*  K 4.7 5.4*  CL 84* 86*  CO2 22 18*  GLUCOSE 134* 119*  BUN 105* 135*  CREATININE 10.43* 13.36*  CALCIUM 8.7* 8.6*   PT/INR Recent Labs    09/10/22 1912  LABPROT 17.0*  INR 1.4*   Comprehensive Metabolic Panel:    Component Value Date/Time   NA 129 (L) 09/12/2022 1756   NA 129 (L) 09/11/2022 0743   K 5.4 (H) 09/12/2022 1756   K 4.7 09/11/2022 0743   CL 86 (L) 09/12/2022 1756   CL 84 (L) 09/11/2022 0743   CO2 18 (L) 09/12/2022 1756   CO2  22 09/11/2022 0743   BUN 135 (H) 09/12/2022 1756   BUN 105 (H) 09/11/2022 0743   CREATININE 13.36 (H) 09/12/2022 1756   CREATININE 10.43 (H) 09/11/2022 0743   GLUCOSE 119 (H) 09/12/2022 1756   GLUCOSE 134 (H) 09/11/2022 0743   CALCIUM 8.6 (L) 09/12/2022 1756   CALCIUM 8.7 (L) 09/11/2022 0743   AST 82 (H) 09/08/2022 1128   AST 53 (H) 09/07/2022 0215   ALT 58 (H) 09/08/2022 1128   ALT 34 09/07/2022 0215   ALKPHOS 106 09/08/2022 1128   ALKPHOS 86 09/07/2022 0215   BILITOT 0.9 09/08/2022 1128   BILITOT 0.6 09/07/2022 0215   PROT 8.0 09/08/2022 1128   PROT 8.3 (H) 09/07/2022 0215   ALBUMIN 2.0 (L) 09/12/2022 1756   ALBUMIN 2.7 (L) 09/08/2022 1128    Studies/Results: IR Fluoro Guide CV Line Right  Result Date: 09/12/2022 INDICATION: History of end-stage renal disease, now admitted with bacteremia, post removal of tunneled right internal jugular approach dialysis catheter on 09/09/2022. Unfortunately, patient has white blood cell count remains persistently elevated and as such she presents today for image guided placement of a temporary hemodialysis catheter to facilitate continued dialysis. Patient with history of contrast allergy though is not received  a preprocedural steroid prep. EXAM: NON-TUNNELED CENTRAL VENOUS HEMODIALYSIS CATHETER PLACEMENT WITH ULTRASOUND AND FLUOROSCOPIC GUIDANCE COMPARISON:  Dialysis catheter removal-09/09/2022; chest radiograph-12/29/2017 MEDICATIONS: None FLUOROSCOPY TIME:  3 minutes, 18 seconds (123 mGy) COMPLICATIONS: None immediate. PROCEDURE: Informed written consent was obtained from the patient after a discussion of the risks, benefits, and alternatives to treatment. Questions regarding the procedure were encouraged and answered. The right neck and chest were prepped with chlorhexidine in a sterile fashion, and a sterile drape was applied covering the operative field. Maximum barrier sterile technique with sterile gowns and gloves were used for the procedure. A  timeout was performed prior to the initiation of the procedure. After the overlying soft tissues were anesthetized, a small venotomy incision was created and a micropuncture kit was utilized to access the internal jugular vein. Real-time ultrasound guidance was utilized for vascular access including the acquisition of a permanent ultrasound image documenting patency of the accessed vessel. There was difficulty advancing the microwire through the SVC. As such, stiff glidewire was cannulated through the outer sheath of the micropuncture kit however again, could not be advanced to the SVC. Given patient's history of contrast allergy, intravenous contrast was not administered. As such, the decision was made to proceed with right common femoral vein approach temporary dialysis catheter placement to facilitate dialysis. The right groin was prepped and draped in usual sterile fashion. The right femoral head was marked fluoroscopically. Under direct ultrasound guidance, the right common femoral vein was accessed with a micropuncture kit after the overlying soft tissues were anesthetized with 1% lidocaine within epinephrine. An ultrasound image was saved documenting patency of the accessed vessel. Under fluoroscopic guidance, the track was dilated ultimately allowing placement of a The microwire was utilized to measure appropriate catheter length. A stiff glidewire was advanced to the [name] of the IVC. Under fluoroscopic guidance, the venotomy was serially dilated, ultimately allowing placement of a 24 cm temporary [place] dialysis catheter with tip ultimately terminating within the inferior aspect of the IVC. Final catheter positioning was confirmed and documented with a spot fluoroscopic image. The catheter aspirates and flushes normally. The catheter was flushed with appropriate volume heparin dwells. The catheter exit site was secured with a 0-Prolene retention suture. A dressing was placed. The patient tolerated the  procedure well without immediate post procedural complication. IMPRESSION: 1. Attempted though ultimately unsuccessful placement of right internal jugular approach temporary hemodialysis catheter secondary to concern for SVC occlusion. Contrast was unable to be administered secondary to patient's history of contrast allergy and lack of preprocedural steroid prep. 2. Successful placement of a right common femoral vein approach 24 cm temporary dialysis catheter with tip terminating within the inferior aspect of the IVC. The catheter is ready for immediate use. PLAN: Recommend correlating with interventional radiology for once the patient's bacteremia has cleared, the patient will need a steroid prep prior to attempted left internal jugular approach hemodialysis catheter placement as this procedure will likely require the administration of contrast. Ultimately, if the patient's SVC is found to be occluded and unable to be traversed, the right common femoral vein dialysis catheter may be converted to a tunneled catheter as indicated. Electronically Signed   By: Simonne Come M.D.   On: 09/12/2022 16:16   IR US Guide Vasc Access Right  Result Date: 09/12/2022 INDICATION: History of end-stage renal disease, now admitted with bacteremia, post removal of tunneled right internal jugular approach dialysis catheter on 09/09/2022. Unfortunately, patient has white blood cell count remains persistently elevated and as  such she presents today for image guided placement of a temporary hemodialysis catheter to facilitate continued dialysis. Patient with history of contrast allergy though is not received a preprocedural steroid prep. EXAM: NON-TUNNELED CENTRAL VENOUS HEMODIALYSIS CATHETER PLACEMENT WITH ULTRASOUND AND FLUOROSCOPIC GUIDANCE COMPARISON:  Dialysis catheter removal-09/09/2022; chest radiograph-12/29/2017 MEDICATIONS: None FLUOROSCOPY TIME:  3 minutes, 18 seconds (123 mGy) COMPLICATIONS: None immediate. PROCEDURE:  Informed written consent was obtained from the patient after a discussion of the risks, benefits, and alternatives to treatment. Questions regarding the procedure were encouraged and answered. The right neck and chest were prepped with chlorhexidine in a sterile fashion, and a sterile drape was applied covering the operative field. Maximum barrier sterile technique with sterile gowns and gloves were used for the procedure. A timeout was performed prior to the initiation of the procedure. After the overlying soft tissues were anesthetized, a small venotomy incision was created and a micropuncture kit was utilized to access the internal jugular vein. Real-time ultrasound guidance was utilized for vascular access including the acquisition of a permanent ultrasound image documenting patency of the accessed vessel. There was difficulty advancing the microwire through the SVC. As such, stiff glidewire was cannulated through the outer sheath of the micropuncture kit however again, could not be advanced to the SVC. Given patient's history of contrast allergy, intravenous contrast was not administered. As such, the decision was made to proceed with right common femoral vein approach temporary dialysis catheter placement to facilitate dialysis. The right groin was prepped and draped in usual sterile fashion. The right femoral head was marked fluoroscopically. Under direct ultrasound guidance, the right common femoral vein was accessed with a micropuncture kit after the overlying soft tissues were anesthetized with 1% lidocaine within epinephrine. An ultrasound image was saved documenting patency of the accessed vessel. Under fluoroscopic guidance, the track was dilated ultimately allowing placement of a The microwire was utilized to measure appropriate catheter length. A stiff glidewire was advanced to the [name] of the IVC. Under fluoroscopic guidance, the venotomy was serially dilated, ultimately allowing placement of a 24  cm temporary [place] dialysis catheter with tip ultimately terminating within the inferior aspect of the IVC. Final catheter positioning was confirmed and documented with a spot fluoroscopic image. The catheter aspirates and flushes normally. The catheter was flushed with appropriate volume heparin dwells. The catheter exit site was secured with a 0-Prolene retention suture. A dressing was placed. The patient tolerated the procedure well without immediate post procedural complication. IMPRESSION: 1. Attempted though ultimately unsuccessful placement of right internal jugular approach temporary hemodialysis catheter secondary to concern for SVC occlusion. Contrast was unable to be administered secondary to patient's history of contrast allergy and lack of preprocedural steroid prep. 2. Successful placement of a right common femoral vein approach 24 cm temporary dialysis catheter with tip terminating within the inferior aspect of the IVC. The catheter is ready for immediate use. PLAN: Recommend correlating with interventional radiology for once the patient's bacteremia has cleared, the patient will need a steroid prep prior to attempted left internal jugular approach hemodialysis catheter placement as this procedure will likely require the administration of contrast. Ultimately, if the patient's SVC is found to be occluded and unable to be traversed, the right common femoral vein dialysis catheter may be converted to a tunneled catheter as indicated. Electronically Signed   By: Simonne Come M.D.   On: 09/12/2022 16:16   DG Abd Portable 1V-Small Bowel Obstruction Protocol-initial, 8 hr delay  Result Date: 09/11/2022 CLINICAL DATA:  Evaluate small-bowel obstruction EXAM: PORTABLE ABDOMEN - 1 VIEW COMPARISON:  Film from earlier in the same day. FINDINGS: Multiple dilated loops of small bowel are again identified. Previously administered contrast lies within the stomach although has also passed distally into the colon  consistent with a partial small bowel obstruction. Right ureteral stent is noted. IMPRESSION: Persistent small bowel dilatation with contrast now seen in the colon consistent with partial small bowel obstruction. Electronically Signed   By: Alcide Clever M.D.   On: 09/11/2022 22:36   ABORTED INVASIVE LAB PROCEDURE  Result Date: 09/11/2022 This case was aborted.     Ryan [name] 09/13/2022  Patient ID: Ryan [name], male   DOB: [birth year], 61 y.o.   MRN: [id number]

## 2022-09-13 NOTE — Plan of Care (Signed)
  Problem: Respiratory: Goal: Ability to maintain adequate ventilation will improve Outcome: Completed/Met   

## 2022-09-13 NOTE — Progress Notes (Signed)
Ryan [name] Progress Note   Subjective:   Patient seen and examined at bedside.  Reports feeling sleepy and confused over the last week.  Feels like he has lost days.  States he does not remember going to dialysis yesterday.  Per notes tolerated well using new temporary femoral HDcatheter.  Denies CP, SOB, abdominal pain and n/v/d.  Complains of back pain but laying on the phone and improved when it was removed from behind his back.    Objective Vitals:   09/12/22 2101 09/12/22 2103 09/13/22 0549 09/13/22 0935  BP: (!) 100/57  109/70 110/69  Pulse: 85  84 84  Resp: 18  18 18   Temp: 98 F (36.7 C)  98.6 F (37 C) 98.4 F (36.9 C)  TempSrc: Oral  Oral Oral  SpO2: 100%  94% 95%  Weight:  81.2 kg    Height:       Physical Exam General:chronically ill appearing male in NAD Heart:RRR, no mrg Lungs:CTAB, nml WOB on RA Abdomen:soft, NTND Extremities:no LE edema Dialysis Access: R femoral temp catheter   Filed Weights   09/09/22 1155 09/12/22 1623 09/12/22 2103  Weight: 81 kg 84.2 kg 81.2 kg    Intake/Output Summary (Last 24 hours) at 09/13/2022 1051 Last data filed at 09/13/2022 0900 Gross per 24 hour  Intake 780 ml  Output 2000 ml  Net -1220 ml    Additional Objective Labs: Basic Metabolic Panel: Recent Labs  Lab 09/10/22 0439 09/11/22 0743 09/12/22 1756  NA 130* 129* 129*  K 4.6 4.7 5.4*  CL 89* 84* 86*  CO2 23 22 18*  GLUCOSE 134* 134* 119*  BUN 72* 105* 135*  CREATININE 8.46* 10.43* 13.36*  CALCIUM 8.5* 8.7* 8.6*  PHOS  --  6.3* 8.2*   Liver Function Tests: Recent Labs  Lab 09/07/22 0215 09/08/22 1128 09/12/22 1756  AST 53* 82*  --   ALT 34 58*  --   ALKPHOS 86 106  --   BILITOT 0.6 0.9  --   PROT 8.3* 8.0  --   ALBUMIN 3.2* 2.7* 2.0*   CBC: Recent Labs  Lab 09/07/22 0215 09/08/22 1128 09/09/22 0349 09/10/22 0439 09/11/22 0436 09/12/22 1755 09/13/22 0329  WBC 23.9*   < > 27.9* 23.9* 28.5* 27.5* 27.0*  NEUTROABS 21.0*  --    --   --  23.6*  --   --   HGB 11.1*   < > 11.2* 10.5* 10.9* 10.2* 10.3*  HCT 34.7*   < > 32.6* 31.6* 32.3* 29.7* 29.7*  MCV 87.8   < > 83.0 82.9 81.6 81.6 81.6  PLT 154   < > 101* 161 157 230 256   < > = values in this interval not displayed.   Blood Culture    Component Value Date/Time   SDES BLOOD RIGHT ANTECUBITAL 09/10/2022 1516   SDES BLOOD LEFT ARM 09/10/2022 1516   SPECREQUEST  09/10/2022 1516    BOTTLES DRAWN AEROBIC AND ANAEROBIC Blood Culture adequate volume   SPECREQUEST  09/10/2022 1516    BOTTLES DRAWN AEROBIC AND ANAEROBIC Blood Culture adequate volume   CULT  09/10/2022 1516    NO GROWTH 3 DAYS Performed at [facility], [address] [place], [place] [address]    CULT  09/10/2022 1516    NO GROWTH 3 DAYS Performed at [facility], [address] [place], [place] [address]    REPTSTATUS PENDING 09/10/2022 1516   REPTSTATUS PENDING 09/10/2022 1516  CBG: Recent Labs  Lab 09/11/22 1809  GLUCAP 117*    Studies/Results: IR Fluoro Guide CV Line Right  Result Date: 09/12/2022 INDICATION: History of end-stage renal disease, now admitted with bacteremia, post removal of tunneled right internal jugular approach dialysis catheter on 09/09/2022. Unfortunately, patient has white blood cell count remains persistently elevated and as such she presents today for image guided placement of a temporary hemodialysis catheter to facilitate continued dialysis. Patient with history of contrast allergy though is not received a preprocedural steroid prep. EXAM: NON-TUNNELED CENTRAL VENOUS HEMODIALYSIS CATHETER PLACEMENT WITH ULTRASOUND AND FLUOROSCOPIC GUIDANCE COMPARISON:  Dialysis catheter removal-09/09/2022; chest radiograph-12/29/2017 MEDICATIONS: None FLUOROSCOPY TIME:  3 minutes, 18 seconds (123 mGy) COMPLICATIONS: None immediate. PROCEDURE: Informed written consent was obtained from the patient after a discussion of the risks, benefits, and alternatives to treatment.  Questions regarding the procedure were encouraged and answered. The right neck and chest were prepped with chlorhexidine in a sterile fashion, and a sterile drape was applied covering the operative field. Maximum barrier sterile technique with sterile gowns and gloves were used for the procedure. A timeout was performed prior to the initiation of the procedure. After the overlying soft tissues were anesthetized, a small venotomy incision was created and a micropuncture kit was utilized to access the internal jugular vein. Real-time ultrasound guidance was utilized for vascular access including the acquisition of a permanent ultrasound image documenting patency of the accessed vessel. There was difficulty advancing the microwire through the SVC. As such, stiff glidewire was cannulated through the outer sheath of the micropuncture kit however again, could not be advanced to the SVC. Given patient's history of contrast allergy, intravenous contrast was not administered. As such, the decision was made to proceed with right common femoral vein approach temporary dialysis catheter placement to facilitate dialysis. The right groin was prepped and draped in usual sterile fashion. The right femoral head was marked fluoroscopically. Under direct ultrasound guidance, the right common femoral vein was accessed with a micropuncture kit after the overlying soft tissues were anesthetized with 1% lidocaine within epinephrine. An ultrasound image was saved documenting patency of the accessed vessel. Under fluoroscopic guidance, the track was dilated ultimately allowing placement of a The microwire was utilized to measure appropriate catheter length. A stiff glidewire was advanced to the level of the IVC. Under fluoroscopic guidance, the venotomy was serially dilated, ultimately allowing placement of a 24 cm temporary [place] dialysis catheter with tip ultimately terminating within the inferior aspect of the IVC. Final catheter  positioning was confirmed and documented with a spot fluoroscopic image. The catheter aspirates and flushes normally. The catheter was flushed with appropriate volume heparin dwells. The catheter exit site was secured with a 0-Prolene retention suture. A dressing was placed. The patient tolerated the procedure well without immediate post procedural complication. IMPRESSION: 1. Attempted though ultimately unsuccessful placement of right internal jugular approach temporary hemodialysis catheter secondary to concern for SVC occlusion. Contrast was unable to be administered secondary to patient's history of contrast allergy and lack of preprocedural steroid prep. 2. Successful placement of a right common femoral vein approach 24 cm temporary dialysis catheter with tip terminating within the inferior aspect of the IVC. The catheter is ready for immediate use. PLAN: Recommend correlating with interventional radiology for once the patient's bacteremia has cleared, the patient will need a steroid prep prior to attempted left internal jugular approach hemodialysis catheter placement as this procedure will likely require the administration of contrast. Ultimately, if the patient's SVC is  found to be occluded and unable to be traversed, the right common femoral vein dialysis catheter may be converted to a tunneled catheter as indicated. Electronically Signed   By: Simonne Come M.D.   On: 09/12/2022 16:16   IR US Guide Vasc Access Right  Result Date: 09/12/2022 INDICATION: History of end-stage renal disease, now admitted with bacteremia, post removal of tunneled right internal jugular approach dialysis catheter on 09/09/2022. Unfortunately, patient has white blood cell count remains persistently elevated and as such she presents today for image guided placement of a temporary hemodialysis catheter to facilitate continued dialysis. Patient with history of contrast allergy though is not received a preprocedural steroid prep.  EXAM: NON-TUNNELED CENTRAL VENOUS HEMODIALYSIS CATHETER PLACEMENT WITH ULTRASOUND AND FLUOROSCOPIC GUIDANCE COMPARISON:  Dialysis catheter removal-09/09/2022; chest radiograph-12/29/2017 MEDICATIONS: None FLUOROSCOPY TIME:  3 minutes, 18 seconds (123 mGy) COMPLICATIONS: None immediate. PROCEDURE: Informed written consent was obtained from the patient after a discussion of the risks, benefits, and alternatives to treatment. Questions regarding the procedure were encouraged and answered. The right neck and chest were prepped with chlorhexidine in a sterile fashion, and a sterile drape was applied covering the operative field. Maximum barrier sterile technique with sterile gowns and gloves were used for the procedure. A timeout was performed prior to the initiation of the procedure. After the overlying soft tissues were anesthetized, a small venotomy incision was created and a micropuncture kit was utilized to access the internal jugular vein. Real-time ultrasound guidance was utilized for vascular access including the acquisition of a permanent ultrasound image documenting patency of the accessed vessel. There was difficulty advancing the microwire through the SVC. As such, stiff glidewire was cannulated through the outer sheath of the micropuncture kit however again, could not be advanced to the SVC. Given patient's history of contrast allergy, intravenous contrast was not administered. As such, the decision was made to proceed with right common femoral vein approach temporary dialysis catheter placement to facilitate dialysis. The right groin was prepped and draped in usual sterile fashion. The right femoral head was marked fluoroscopically. Under direct ultrasound guidance, the right common femoral vein was accessed with a micropuncture kit after the overlying soft tissues were anesthetized with 1% lidocaine within epinephrine. An ultrasound image was saved documenting patency of the accessed vessel. Under  fluoroscopic guidance, the track was dilated ultimately allowing placement of a The microwire was utilized to measure appropriate catheter length. A stiff glidewire was advanced to the level of the IVC. Under fluoroscopic guidance, the venotomy was serially dilated, ultimately allowing placement of a 24 cm temporary [place] dialysis catheter with tip ultimately terminating within the inferior aspect of the IVC. Final catheter positioning was confirmed and documented with a spot fluoroscopic image. The catheter aspirates and flushes normally. The catheter was flushed with appropriate volume heparin dwells. The catheter exit site was secured with a 0-Prolene retention suture. A dressing was placed. The patient tolerated the procedure well without immediate post procedural complication. IMPRESSION: 1. Attempted though ultimately unsuccessful placement of right internal jugular approach temporary hemodialysis catheter secondary to concern for SVC occlusion. Contrast was unable to be administered secondary to patient's history of contrast allergy and lack of preprocedural steroid prep. 2. Successful placement of a right common femoral vein approach 24 cm temporary dialysis catheter with tip terminating within the inferior aspect of the IVC. The catheter is ready for immediate use. PLAN: Recommend correlating with interventional radiology for once the patient's bacteremia has cleared, the patient will need a steroid  prep prior to attempted left internal jugular approach hemodialysis catheter placement as this procedure will likely require the administration of contrast. Ultimately, if the patient's SVC is found to be occluded and unable to be traversed, the right common femoral vein dialysis catheter may be converted to a tunneled catheter as indicated. Electronically Signed   By: Simonne Come M.D.   On: 09/12/2022 16:16   DG Abd Portable 1V-Small Bowel Obstruction Protocol-initial, 8 hr delay  Result Date:  09/11/2022 CLINICAL DATA:  Evaluate small-bowel obstruction EXAM: PORTABLE ABDOMEN - 1 VIEW COMPARISON:  Film from earlier in the same day. FINDINGS: Multiple dilated loops of small bowel are again identified. Previously administered contrast lies within the stomach although has also passed distally into the colon consistent with a partial small bowel obstruction. Right ureteral stent is noted. IMPRESSION: Persistent small bowel dilatation with contrast now seen in the colon consistent with partial small bowel obstruction. Electronically Signed   By: Alcide Clever M.D.   On: 09/11/2022 22:36   ABORTED INVASIVE LAB PROCEDURE  Result Date: 09/11/2022 This case was aborted.   Medications:   ceFAZolin (ANCEF) IV 1 g (09/12/22 2155)   chlorproMAZINE (THORAZINE) 12.5 mg in sodium chloride 0.9 % 25 mL IVPB 12.5 mg (09/13/22 4098)   methocarbamol (ROBAXIN) IV 500 mg (09/11/22 2229)    atenolol  50 mg Oral Daily   Chlorhexidine Gluconate Cloth  6 each Topical Daily   cinacalcet  30 mg Oral Q supper   multivitamin  1 tablet Oral QHS   pantoprazole  40 mg Oral Daily   sodium chloride flush  3 mL Intravenous Q12H   sodium zirconium cyclosilicate  10 g Oral Daily    Dialysis Orders: [facility] TTS 3:45 450/A1.5x 2K/2Ca EDW 85.7kg TDC  -Heparin 3000 U  -No ESA -Calcitriol 1.5      Assessment/Plan: 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 ID following. Repeat blood cultures ordered--+ MSSA.  On cefazolin. TDC removed 6/18 -ID recommends 72 hour line holiday. TTE concerning for endocarditis. TEE cancelled d/t high risk factors. Rpt blood cultures 6/19 -ng to date. WBCs 28. Appreciate ID input. Temp cath placed yesterday following line holiday.  Appreciate IR assistance.  Dialysis Access: non-tunneled HD catheter via right  CFV. Per IR notes unable to place RIJ [place] d/t occlusion of SVC that was incompletely evaluated d/t contrast allergy.  IR recommend consulting them for steroid prep prior to  attempted L IJ TDC vs conversion of R fem to [facility].   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 Surgery consulting.  ESRD -  HD TTS. Last HD 6/18.  HD off schedule overnight Checking labs again today.  If able will hold off on HD until Monday or Tuesday.  BUN elevated yesterday at 135 pre HD.  Will resume regular schedule at some point next week.  Access -  Had L AVF ligation in 2019. Last seen by Dr. Edilia Bo 01/2022 -limited mobility in R arm so planning for L arm graft but had not scheduled surgery yet.   Hypertension/volume  - BP/volume acceptable. Under dry weight.  Anemia  - Hb 10.9 Not on ESA currently -follow trends  Metabolic bone disease -  Ca acceptable. Follow phos --d/c sevelamer d/t SBO.  Nutrition - Renal diet when eating, currently on liquids diet.   Ryan Norfolk, PA-C [name] 09/13/2022,10:51 AM  LOS: 6 days

## 2022-09-14 DIAGNOSIS — M4647 Discitis, unspecified, lumbosacral region: Secondary | ICD-10-CM | POA: Diagnosis not present

## 2022-09-14 LAB — CBC
HCT: 28.5 % — ABNORMAL LOW (ref 39.0–52.0)
Hemoglobin: 9.7 g/dL — ABNORMAL LOW (ref 13.0–17.0)
MCH: 27.6 pg (ref 26.0–34.0)
MCHC: 34 g/dL (ref 30.0–36.0)
MCV: 81 fL (ref 80.0–100.0)
Platelets: 267 10*3/uL (ref 150–400)
RBC: 3.52 MIL/uL — ABNORMAL LOW (ref 4.22–5.81)
RDW: 15.5 % (ref 11.5–15.5)
WBC: 31.4 10*3/uL — ABNORMAL HIGH (ref 4.0–10.5)
nRBC: 0 % (ref 0.0–0.2)

## 2022-09-14 MED ORDER — SODIUM CHLORIDE 0.9 % IV SOLN: INTRAVENOUS | Status: AC

## 2022-09-14 NOTE — Progress Notes (Signed)
[facility] Progress Note   Subjective:   Patient seen and examined at bedside.  Continues to have hiccups which trigger his pain.  No other specific complaints.  Denies CP, SOB, abdominal pain and n/v/d.    Objective Vitals:   09/13/22 2112 09/14/22 0555 09/14/22 0908 09/14/22 0952  BP: 91/65 93/60 (!) 84/56 (!) 81/58  Pulse: 82 79 80 80  Resp: 17 16 18    Temp: 98.6 F (37 C) 98.2 F (36.8 C) 98 F (36.7 C)   TempSrc: Oral Oral Oral   SpO2: 94% 92% 95%   Weight:      Height:       Physical Exam General:chronically ill appearing, lethargic male in NAD Heart:RRR, no mrg Lungs:CTAB, nml WOB on RA Abdomen:soft, NTND Extremities:no LE edema Dialysis Access: temp cath R groin   Filed Weights   09/09/22 1155 09/12/22 1623 09/12/22 2103  Weight: 81 kg 84.2 kg 81.2 kg    Intake/Output Summary (Last 24 hours) at 09/14/2022 1038 Last data filed at 09/14/2022 1610 Gross per 24 hour  Intake 855.31 ml  Output 0 ml  Net 855.31 ml    Additional Objective Labs: Basic Metabolic Panel: Recent Labs  Lab 09/11/22 0743 09/12/22 1756 09/13/22 0329  NA 129* 129* 131*  K 4.7 5.4* 4.6  CL 84* 86* 92*  CO2 22 18* 22  GLUCOSE 134* 119* 120*  BUN 105* 135* 59*  CREATININE 10.43* 13.36* 7.83*  CALCIUM 8.7* 8.6* 8.0*  PHOS 6.3* 8.2* 6.2*   Liver Function Tests: Recent Labs  Lab 09/08/22 1128 09/12/22 1756 09/13/22 0329  AST 82*  --   --   ALT 58*  --   --   ALKPHOS 106  --   --   BILITOT 0.9  --   --   PROT 8.0  --   --   ALBUMIN 2.7* 2.0* 1.9*   CBC: Recent Labs  Lab 09/09/22 0349 09/10/22 0439 09/11/22 0436 09/12/22 1755 09/13/22 0329  WBC 27.9* 23.9* 28.5* 27.5* 27.0*  NEUTROABS  --   --  23.6*  --   --   HGB 11.2* 10.5* 10.9* 10.2* 10.3*  HCT 32.6* 31.6* 32.3* 29.7* 29.7*  MCV 83.0 82.9 81.6 81.6 81.6  PLT 101* 161 157 230 256   Blood Culture    Component Value Date/Time   SDES BLOOD RIGHT ANTECUBITAL 09/10/2022 1516   SDES BLOOD LEFT  ARM 09/10/2022 1516   SPECREQUEST  09/10/2022 1516    BOTTLES DRAWN AEROBIC AND ANAEROBIC Blood Culture adequate volume   SPECREQUEST  09/10/2022 1516    BOTTLES DRAWN AEROBIC AND ANAEROBIC Blood Culture adequate volume   CULT  09/10/2022 1516    NO GROWTH 4 DAYS Performed at [facility], [address], [place], [place] [address]    CULT  09/10/2022 1516    NO GROWTH 4 DAYS Performed at [facility], [address] [place], [place] [address]    REPTSTATUS PENDING 09/10/2022 1516   REPTSTATUS PENDING 09/10/2022 1516    CBG: Recent Labs  Lab 09/11/22 1809  GLUCAP 117*   Iron Studies: No results for input(s): "IRON", "TIBC", "TRANSFERRIN", "FERRITIN" in the last 72 hours. Lab Results  Component Value Date   INR 1.4 (H) 09/10/2022   INR 1.13 09/23/2017   INR 1.01 08/28/2016   Studies/Results: IR Fluoro Guide CV Line Right  Result Date: 09/12/2022 INDICATION: History of end-stage renal disease, now admitted with bacteremia, post removal of tunneled right  internal jugular approach dialysis catheter on 09/09/2022. Unfortunately, patient has white blood cell count remains persistently elevated and as such she presents today for image guided placement of a temporary hemodialysis catheter to facilitate continued dialysis. Patient with history of contrast allergy though is not received a preprocedural steroid prep. EXAM: NON-TUNNELED CENTRAL VENOUS HEMODIALYSIS CATHETER PLACEMENT WITH ULTRASOUND AND FLUOROSCOPIC GUIDANCE COMPARISON:  Dialysis catheter removal-09/09/2022; chest radiograph-12/29/2017 MEDICATIONS: None FLUOROSCOPY TIME:  3 minutes, 18 seconds (123 mGy) COMPLICATIONS: None immediate. PROCEDURE: Informed written consent was obtained from the patient after a discussion of the risks, benefits, and alternatives to treatment. Questions regarding the procedure were encouraged and answered. The right neck and chest were prepped with chlorhexidine in a sterile fashion, and a  sterile drape was applied covering the operative field. Maximum barrier sterile technique with sterile gowns and gloves were used for the procedure. A timeout was performed prior to the initiation of the procedure. After the overlying soft tissues were anesthetized, a small venotomy incision was created and a micropuncture kit was utilized to access the internal jugular vein. Real-time ultrasound guidance was utilized for vascular access including the acquisition of a permanent ultrasound image documenting patency of the accessed vessel. There was difficulty advancing the microwire through the SVC. As such, stiff glidewire was cannulated through the outer sheath of the micropuncture kit however again, could not be advanced to the SVC. Given patient's history of contrast allergy, intravenous contrast was not administered. As such, the decision was made to proceed with right common femoral vein approach temporary dialysis catheter placement to facilitate dialysis. The right groin was prepped and draped in usual sterile fashion. The right femoral head was marked fluoroscopically. Under direct ultrasound guidance, the right common femoral vein was accessed with a micropuncture kit after the overlying soft tissues were anesthetized with 1% lidocaine within epinephrine. An ultrasound image was saved documenting patency of the accessed vessel. Under fluoroscopic guidance, the track was dilated ultimately allowing placement of a The microwire was utilized to measure appropriate catheter length. A stiff glidewire was advanced to the level of the IVC. Under fluoroscopic guidance, the venotomy was serially dilated, ultimately allowing placement of a 24 cm temporary [place] dialysis catheter with tip ultimately terminating within the inferior aspect of the IVC. Final catheter positioning was confirmed and documented with a spot fluoroscopic image. The catheter aspirates and flushes normally. The catheter was flushed with  appropriate volume heparin dwells. The catheter exit site was secured with a 0-Prolene retention suture. A dressing was placed. The patient tolerated the procedure well without immediate post procedural complication. IMPRESSION: 1. Attempted though ultimately unsuccessful placement of right internal jugular approach temporary hemodialysis catheter secondary to concern for SVC occlusion. Contrast was unable to be administered secondary to patient's history of contrast allergy and lack of preprocedural steroid prep. 2. Successful placement of a right common femoral vein approach 24 cm temporary dialysis catheter with tip terminating within the inferior aspect of the IVC. The catheter is ready for immediate use. PLAN: Recommend correlating with interventional radiology for once the patient's bacteremia has cleared, the patient will need a steroid prep prior to attempted left internal jugular approach hemodialysis catheter placement as this procedure will likely require the administration of contrast. Ultimately, if the patient's SVC is found to be occluded and unable to be traversed, the right common femoral vein dialysis catheter may be converted to a tunneled catheter as indicated. Electronically Signed   By: Simonne Come M.D.   On: 09/12/2022 16:16  IR US Guide Vasc Access Right  Result Date: 09/12/2022 INDICATION: History of end-stage renal disease, now admitted with bacteremia, post removal of tunneled right internal jugular approach dialysis catheter on 09/09/2022. Unfortunately, patient has white blood cell count remains persistently elevated and as such she presents today for image guided placement of a temporary hemodialysis catheter to facilitate continued dialysis. Patient with history of contrast allergy though is not received a preprocedural steroid prep. EXAM: NON-TUNNELED CENTRAL VENOUS HEMODIALYSIS CATHETER PLACEMENT WITH ULTRASOUND AND FLUOROSCOPIC GUIDANCE COMPARISON:  Dialysis catheter  removal-09/09/2022; chest radiograph-12/29/2017 MEDICATIONS: None FLUOROSCOPY TIME:  3 minutes, 18 seconds (123 mGy) COMPLICATIONS: None immediate. PROCEDURE: Informed written consent was obtained from the patient after a discussion of the risks, benefits, and alternatives to treatment. Questions regarding the procedure were encouraged and answered. The right neck and chest were prepped with chlorhexidine in a sterile fashion, and a sterile drape was applied covering the operative field. Maximum barrier sterile technique with sterile gowns and gloves were used for the procedure. A timeout was performed prior to the initiation of the procedure. After the overlying soft tissues were anesthetized, a small venotomy incision was created and a micropuncture kit was utilized to access the internal jugular vein. Real-time ultrasound guidance was utilized for vascular access including the acquisition of a permanent ultrasound image documenting patency of the accessed vessel. There was difficulty advancing the microwire through the SVC. As such, stiff glidewire was cannulated through the outer sheath of the micropuncture kit however again, could not be advanced to the SVC. Given patient's history of contrast allergy, intravenous contrast was not administered. As such, the decision was made to proceed with right common femoral vein approach temporary dialysis catheter placement to facilitate dialysis. The right groin was prepped and draped in usual sterile fashion. The right femoral head was marked fluoroscopically. Under direct ultrasound guidance, the right common femoral vein was accessed with a micropuncture kit after the overlying soft tissues were anesthetized with 1% lidocaine within epinephrine. An ultrasound image was saved documenting patency of the accessed vessel. Under fluoroscopic guidance, the track was dilated ultimately allowing placement of a The microwire was utilized to measure appropriate catheter length.  A stiff glidewire was advanced to the level of the IVC. Under fluoroscopic guidance, the venotomy was serially dilated, ultimately allowing placement of a 24 cm temporary [place] dialysis catheter with tip ultimately terminating within the inferior aspect of the IVC. Final catheter positioning was confirmed and documented with a spot fluoroscopic image. The catheter aspirates and flushes normally. The catheter was flushed with appropriate volume heparin dwells. The catheter exit site was secured with a 0-Prolene retention suture. A dressing was placed. The patient tolerated the procedure well without immediate post procedural complication. IMPRESSION: 1. Attempted though ultimately unsuccessful placement of right internal jugular approach temporary hemodialysis catheter secondary to concern for SVC occlusion. Contrast was unable to be administered secondary to patient's history of contrast allergy and lack of preprocedural steroid prep. 2. Successful placement of a right common femoral vein approach 24 cm temporary dialysis catheter with tip terminating within the inferior aspect of the IVC. The catheter is ready for immediate use. PLAN: Recommend correlating with interventional radiology for once the patient's bacteremia has cleared, the patient will need a steroid prep prior to attempted left internal jugular approach hemodialysis catheter placement as this procedure will likely require the administration of contrast. Ultimately, if the patient's SVC is found to be occluded and unable to be traversed, the right common femoral vein  dialysis catheter may be converted to a tunneled catheter as indicated. Electronically Signed   By: Simonne Come M.D.   On: 09/12/2022 16:16    Medications:   ceFAZolin (ANCEF) IV 1 g (09/13/22 1701)   chlorproMAZINE (THORAZINE) 12.5 mg in sodium chloride 0.9 % 25 mL IVPB 12.5 mg (09/14/22 0054)   methocarbamol (ROBAXIN) IV 500 mg (09/11/22 2229)    atenolol  50 mg Oral Daily    Chlorhexidine Gluconate Cloth  6 each Topical Daily   cinacalcet  30 mg Oral Q supper   multivitamin  1 tablet Oral QHS   pantoprazole  40 mg Oral Daily   sodium chloride flush  3 mL Intravenous Q12H   sodium zirconium cyclosilicate  10 g Oral Daily    Dialysis Orders: [facility] TTS 3:45 450/A1.5x 2K/2Ca EDW 85.7kg TDC  -Heparin 3000 U  -No ESA -Calcitriol 1.5      Assessment/Plan: 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 ID following. Repeat blood cultures ordered-+ MSSA.  On cefazolin. TDC removed 6/18 -ID recommends 72 hour line holiday. TTE concerning for endocarditis. TEE cancelled d/t high risk factors. Rpt blood cultures 6/19 -ng to date. WBCs 28. Appreciate ID input. Temp cath placed 6/21 following line holiday.  Appreciate IR assistance.  Dialysis Access: non-tunneled HD catheter via right CFV. Per IR notes unable to place RIJ [facility] d/t occlusion of SVC that was incompletely evaluated d/t contrast allergy.  IR recommend consulting them for steroid prep prior to attempted L IJ TDC vs conversion of R fem to [facility].   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 Surgery consulting.  ESRD -  HD TTS. Last HD 6/18.  HD off schedule overnight Friday.  If able will hold off on HD until Tuesday.  Volume status stable.  Will check labs in the AM to make sure no acute indications for HD tomorrow.  Access -  Had L AVF ligation in 2019. Last seen by Dr. Edilia Bo 01/2022 -limited mobility in R arm so planning for L arm graft but had not scheduled surgery yet.   Hypertension/volume  - BP/volume acceptable. Under dry weight. Does not appear volume overloaded, UF as tolerated. Anemia  - Hb 10.3 Not on ESA currently -follow trends  Metabolic bone disease -  Ca acceptable. Phos 6.2 --d/c sevelamer d/t SBO. Will need to start a new binder when eating solid food again.  Nutrition - Renal diet when eating, currently on liquids diet.  Virgina Norfolk, PA-C [facility] 09/14/2022,10:38 AM  LOS: 7 days

## 2022-09-14 NOTE — Progress Notes (Signed)
PROGRESS NOTE    Ryan [name]  ZOX:[id number] DOB: 1960-07-16 DOA: 09/07/2022 PCP: Sharin Grave, MD   Brief Narrative: Ryan [name] is a 62 y.o. male with a history of hypertension, hyperlipidemia, ESRD on hemodialysis.  Patient presented secondary to low back pain and was found to have evidence of L5-S1 discitis/osteomyelitis in addition to MSSA bacteremia.  ID consulted.  Workup significant for possible endocarditis. Patient also found to have a small bowel obstruction.   Assessment/Plan:  L5-S1 discitis/osteomyelitis MSSA bacteremia Confirmed possible early discitis/osteomyelitis on MRI imaging.  Blood cultures positive for MSSA.  Patient was empirically treated with vancomycin and cefepime.  Infectious disease consulted.  Patient transition to cefazolin IV with hemodialysis.  Transthoracic echocardiogram significant for a large, rounded structure on the anterior leaflet of the mitral valve concerning for endocarditis.  ID recommending transesophageal echocardiogram; cardiology consulted. Transesophageal Echocardiogram deferred secondary to risk/benefit profile. 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 -Infectious disease recommendations: Cefazolin IV with hemodialysis with plan for 6 weeks of treatment from 6/18  ESRD on hemodialysis Nephrology consulted.  Patient presented with a tunneled dialysis catheter which was removed on 6/18 secondary to bacteremia.  Per ID, patient requires a 72-hour line holiday.  Patient last received hemodialysis on 6/18. Interventional radiology consulted and placed non-tunneled HD cath on 6/21. Hemodialysis resumed. -Nephrology recommendations: pending today -IR recommendations (6/21): recommend coordinating with the IR service for sterioid prep prior to attempted left internal jugular approach HD catheter placement versus conversion of right CFV temp catheter to tunneled HD  catheter.   Intractable hiccups Patient started on Thorazine as needed. Possibly related to uremia. Recurrent.  Not improving with therapies.  Small bowel obstruction Patient without a bowel movement for several days initially.  Diagnosis confirmed on CT scan with transition point located in the right anterior abdomen.  General surgery consulted. Patient underwent SBO protocol and gastrografin made it to his colon suggesting at most a partial small bowel obstruction at that time, although per general surgery, possible ileus related to possible gastroenteritis. Symptoms appear to be resolved. -General surgery recommendations: clear liquid diet, recommendations pending today -Limit narcotics  Primary hypertension Patient with mild hypotension this morning.  Atenolol held.  Per nephrology, patient is under dry weight which likely is contributing to lower blood pressures. -Monitor blood pressure today  Elevated troponin Previously noted. No associated chest pain. Presumed secondary to demand ischemia.  Lung nodules Noted on initial CT scan.  Initial recommendation for repeat CT scan of the chest in 18 to 24 months.  Repeat CT scan on 6/20 showed multiple new pulmonary nodules compatible with likely active infection and/or aspiration.  Patient without symptoms to suggest pneumonia although it is likely these nodules are septic in etiology.  Bladder wall thickening Noted incidentally on CT imaging.  No urinary symptoms and patient does not make urine secondary to ESRD. Unclear if this correlates with isolated infection or is related to overall infectious picture.   DVT prophylaxis: SCDs Code Status:   Code Status: Full Code Family Communication: None at bedside Disposition Plan: Discharge home pending completion of endocarditis workup/management, and outpatient antibiotic regimen and hemodialysis recommendations   Consultants:  Infectious disease Nephrology Interventional  radiology Cardiothoracic surgery  Procedures:  Hemodialysis 6/18: Successful removal of tunneled hemodialysis catheter  6/21: Successful placement of a non-tunneled HD catheter via the right CFV with tips terminating within the inferior aspect of the IVC   Antimicrobials: Vancomycin IV Flagyl IV Cefepime IV  Cefazolin IV   Subjective: Still having hiccups. No other issues overnight or this morning.  Objective: BP (!) 81/58   Pulse 80   Temp 98 F (36.7 C) (Oral)   Resp 18   Ht 5\' 9"  (1.753 m)   Wt 81.2 kg   SpO2 95%   BMI 26.44 kg/m   Examination:  General exam: Appears calm and comfortable Respiratory system: Clear to auscultation. Respiratory effort normal. Cardiovascular system: S1 & S2 heard, RRR. Gastrointestinal system: Abdomen is nondistended, soft and nontender. Decreased bowel sounds heard. Central nervous system: Alert and oriented. Musculoskeletal: No edema. No calf tenderness Skin: No cyanosis. No rashes Psychiatry: Judgement and insight appear normal. Mood & affect appropriate.   Data Reviewed: I have personally reviewed following labs and imaging studies   Last CBC Lab Results  Component Value Date   WBC 31.4 (H) 09/14/2022   HGB 9.7 (L) 09/14/2022   HCT 28.5 (L) 09/14/2022   MCV 81.0 09/14/2022   MCH 27.6 09/14/2022   RDW 15.5 09/14/2022   PLT 267 09/14/2022     Last metabolic panel Lab Results  Component Value Date   GLUCOSE 120 (H) 09/13/2022   NA 131 (L) 09/13/2022   K 4.6 09/13/2022   CL 92 (L) 09/13/2022   CO2 22 09/13/2022   BUN 59 (H) 09/13/2022   CREATININE 7.83 (H) 09/13/2022   GFRNONAA 7 (L) 09/13/2022   CALCIUM 8.0 (L) 09/13/2022   PHOS 6.2 (H) 09/13/2022   PROT 8.0 09/08/2022   ALBUMIN 1.9 (L) 09/13/2022   BILITOT 0.9 09/08/2022   ALKPHOS 106 09/08/2022   AST 82 (H) 09/08/2022   ALT 58 (H) 09/08/2022   ANIONGAP 17 (H) 09/13/2022     Creatinine Clearance: Estimated Creatinine Clearance: 9.9 mL/min (A) (by C-G  formula based on SCr of 7.83 mg/dL (H)).  Recent Results (from the past 240 hour(s))  Blood culture (routine x 2)     Status: Abnormal   Collection Time: 09/07/22  2:15 AM   Specimen: BLOOD  Result Value Ref Range Status   Specimen Description   Final    BLOOD LEFT ANTECUBITAL Performed at [facility], [address], [place], [place] [address]    Special Requests   Final    BOTTLES DRAWN AEROBIC AND ANAEROBIC Blood Culture adequate volume Performed at [facility], [address], [place], [place] [address]    Culture  Setup Time   Final    GRAM POSITIVE COCCI IN BOTH AEROBIC AND ANAEROBIC BOTTLES CRITICAL VALUE NOTED.  VALUE IS CONSISTENT WITH PREVIOUSLY REPORTED AND CALLED VALUE.    Culture (A)  Final    STAPHYLOCOCCUS AUREUS SUSCEPTIBILITIES PERFORMED ON PREVIOUS CULTURE WITHIN THE LAST 5 DAYS. Performed at [facility], [address], [place], [place] [address]    Report Status 09/09/2022 FINAL  Final  Blood culture (routine x 2)     Status: Abnormal   Collection Time: 09/07/22  2:51 AM   Specimen: BLOOD  Result Value Ref Range Status   Specimen Description   Final    BLOOD BLOOD RIGHT HAND Performed at [facility], [address], [place], [place] [address]    Special Requests   Final    BOTTLES DRAWN AEROBIC AND ANAEROBIC Blood Culture adequate volume Performed at [facility], [address] [place], [place] [address]    Culture  Setup Time   Final    GRAM POSITIVE COCCI IN BOTH AEROBIC  AND ANAEROBIC BOTTLES CRITICAL RESULT CALLED TO, READ BACK BY AND VERIFIED WITH: PHARMD NICK GLOGOVAC [id number] 1503 BY EC Performed at [facility], [address] [place], [place] [address]    Culture STAPHYLOCOCCUS AUREUS (A)  Final   Report Status 09/09/2022 FINAL  Final   Organism ID, Bacteria STAPHYLOCOCCUS AUREUS  Final      Susceptibility   Staphylococcus aureus - MIC*     CIPROFLOXACIN <=0.5 SENSITIVE Sensitive     ERYTHROMYCIN <=0.25 SENSITIVE Sensitive     GENTAMICIN <=0.5 SENSITIVE Sensitive     OXACILLIN 0.5 SENSITIVE Sensitive     TETRACYCLINE <=1 SENSITIVE Sensitive     VANCOMYCIN 1 SENSITIVE Sensitive     TRIMETH/SULFA <=10 SENSITIVE Sensitive     CLINDAMYCIN <=0.25 SENSITIVE Sensitive     RIFAMPIN <=0.5 SENSITIVE Sensitive     Inducible Clindamycin NEGATIVE Sensitive     LINEZOLID 2 SENSITIVE Sensitive     * STAPHYLOCOCCUS AUREUS  Blood Culture ID Panel (Reflexed)     Status: Abnormal   Collection Time: 09/07/22  2:51 AM  Result Value Ref Range Status   Enterococcus faecalis NOT DETECTED NOT DETECTED Final   Enterococcus Faecium NOT DETECTED NOT DETECTED Final   Listeria monocytogenes NOT DETECTED NOT DETECTED Final   Staphylococcus species DETECTED (A) NOT DETECTED Final    Comment: CRITICAL RESULT CALLED TO, READ BACK BY AND VERIFIED WITH: PHARMD NICK GLOGOVAC [id number] AT 1503 BY EC    Staphylococcus aureus (BCID) DETECTED (A) NOT DETECTED Final    Comment: CRITICAL RESULT CALLED TO, READ BACK BY AND VERIFIED WITH: PHARMD NICK GLOGOVAC [id number] AT 1503 BY EC    Staphylococcus epidermidis NOT DETECTED NOT DETECTED Final   Staphylococcus lugdunensis NOT DETECTED NOT DETECTED Final   Streptococcus species NOT DETECTED NOT DETECTED Final   Streptococcus agalactiae NOT DETECTED NOT DETECTED Final   Streptococcus pneumoniae NOT DETECTED NOT DETECTED Final   Streptococcus pyogenes NOT DETECTED NOT DETECTED Final   A.calcoaceticus-baumannii NOT DETECTED NOT DETECTED Final   Bacteroides fragilis NOT DETECTED NOT DETECTED Final   Enterobacterales NOT DETECTED NOT DETECTED Final   Enterobacter cloacae complex NOT DETECTED NOT DETECTED Final   Escherichia coli NOT DETECTED NOT DETECTED Final   Klebsiella aerogenes NOT DETECTED NOT DETECTED Final   Klebsiella oxytoca NOT DETECTED NOT DETECTED Final   Klebsiella pneumoniae NOT DETECTED NOT DETECTED  Final   Proteus species NOT DETECTED NOT DETECTED Final   Salmonella species NOT DETECTED NOT DETECTED Final   Serratia marcescens NOT DETECTED NOT DETECTED Final   Haemophilus influenzae NOT DETECTED NOT DETECTED Final   Neisseria meningitidis NOT DETECTED NOT DETECTED Final   Pseudomonas aeruginosa NOT DETECTED NOT DETECTED Final   Stenotrophomonas maltophilia NOT DETECTED NOT DETECTED Final   Candida albicans NOT DETECTED NOT DETECTED Final   Candida auris NOT DETECTED NOT DETECTED Final   Candida glabrata NOT DETECTED NOT DETECTED Final   Candida krusei NOT DETECTED NOT DETECTED Final   Candida parapsilosis NOT DETECTED NOT DETECTED Final   Candida tropicalis NOT DETECTED NOT DETECTED Final   Cryptococcus neoformans/gattii NOT DETECTED NOT DETECTED Final   Meth resistant mecA/C and MREJ NOT DETECTED NOT DETECTED Final    Comment: Performed at [facility], [address], [place], [place] [address]  Respiratory (~20 pathogens) panel by PCR     Status: None   Collection Time: 09/07/22  5:45 AM   Specimen: Anterior Nasal Swab; Respiratory  Result Value Ref Range Status  Adenovirus NOT DETECTED NOT DETECTED Final   Coronavirus 229E NOT DETECTED NOT DETECTED Final    Comment: (NOTE) The Coronavirus on the Respiratory Panel, DOES NOT test for the novel  Coronavirus (2019 nCoV)    Coronavirus HKU1 NOT DETECTED NOT DETECTED Final   Coronavirus NL63 NOT DETECTED NOT DETECTED Final   Coronavirus OC43 NOT DETECTED NOT DETECTED Final   Metapneumovirus NOT DETECTED NOT DETECTED Final   Rhinovirus / Enterovirus NOT DETECTED NOT DETECTED Final   Influenza A NOT DETECTED NOT DETECTED Final   Influenza B NOT DETECTED NOT DETECTED Final   Parainfluenza Virus 1 NOT DETECTED NOT DETECTED Final   Parainfluenza Virus 2 NOT DETECTED NOT DETECTED Final   Parainfluenza Virus 3 NOT DETECTED NOT DETECTED Final   Parainfluenza Virus 4 NOT DETECTED NOT DETECTED Final   Respiratory Syncytial  Virus NOT DETECTED NOT DETECTED Final   Bordetella pertussis NOT DETECTED NOT DETECTED Final   Bordetella Parapertussis NOT DETECTED NOT DETECTED Final   Chlamydophila pneumoniae NOT DETECTED NOT DETECTED Final   Mycoplasma pneumoniae NOT DETECTED NOT DETECTED Final    Comment: Performed at [facility], [address] [place], [place] [address]  SARS Coronavirus 2 by RT PCR (hospital order, performed in [facility] hospital lab) *cepheid single result test* Anterior Nasal Swab     Status: None   Collection Time: 09/07/22  5:45 AM   Specimen: Anterior Nasal Swab  Result Value Ref Range Status   SARS Coronavirus 2 by RT PCR NEGATIVE NEGATIVE Final    Comment: (NOTE) SARS-CoV-2 target nucleic acids are NOT DETECTED.  The SARS-CoV-2 RNA is generally detectable in upper and lower respiratory specimens during the acute phase of infection. The lowest concentration of SARS-CoV-2 viral copies this assay can detect is 250 copies / mL. A negative result does not preclude SARS-CoV-2 infection and should not be used as the sole basis for treatment or other patient management decisions.  A negative result may occur with improper specimen collection / handling, submission of specimen other than nasopharyngeal swab, presence of viral mutation(s) within the areas targeted by this assay, and inadequate number of viral copies (<250 copies / mL). A negative result must be combined with clinical observations, patient history, and epidemiological information.  Fact Sheet for Patients:   RoadLapTop.co.za  Fact Sheet for Healthcare Providers: http://kim-miller.com/  This test is not yet approved or  cleared by the [place] FDA and has been authorized for detection and/or diagnosis of SARS-CoV-2 by FDA under an Emergency Use Authorization (EUA).  This EUA will remain in effect (meaning this test can be used) for the duration of the COVID-19  declaration under Section 564(b)(1) of the Act, 21 U.S.C. section 360bbb-3(b)(1), unless the authorization is terminated or revoked sooner.  Performed at [facility], [address], [place], [place] [address]   Culture, blood (Routine X 2) w Reflex to ID Panel     Status: None   Collection Time: 09/08/22 11:28 AM   Specimen: BLOOD LEFT HAND  Result Value Ref Range Status   Specimen Description BLOOD LEFT HAND  Final   Special Requests   Final    BOTTLES DRAWN AEROBIC AND ANAEROBIC Blood Culture adequate volume   Culture   Final    NO GROWTH 5 DAYS Performed at [facility], [address] [place], [place] [address]    Report Status 09/13/2022 FINAL  Final  Culture, blood (Routine X 2) w Reflex to ID Panel  Status: None   Collection Time: 09/08/22 11:28 AM   Specimen: BLOOD RIGHT HAND  Result Value Ref Range Status   Specimen Description BLOOD RIGHT HAND  Final   Special Requests   Final    BOTTLES DRAWN AEROBIC AND ANAEROBIC Blood Culture adequate volume   Culture   Final    NO GROWTH 5 DAYS Performed at [facility], [address] [place], [place] [address]    Report Status 09/13/2022 FINAL  Final  Culture, blood (Routine X 2) w Reflex to ID Panel     Status: None (Preliminary result)   Collection Time: 09/10/22  3:16 PM   Specimen: BLOOD  Result Value Ref Range Status   Specimen Description BLOOD RIGHT ANTECUBITAL  Final   Special Requests   Final    BOTTLES DRAWN AEROBIC AND ANAEROBIC Blood Culture adequate volume   Culture   Final    NO GROWTH 4 DAYS Performed at [facility], [address], [place], [place] [address]    Report Status PENDING  Incomplete  Culture, blood (Routine X 2) w Reflex to ID Panel     Status: None (Preliminary result)   Collection Time: 09/10/22  3:16 PM   Specimen: BLOOD LEFT ARM  Result Value Ref Range Status   Specimen Description BLOOD LEFT ARM  Final   Special Requests   Final    BOTTLES  DRAWN AEROBIC AND ANAEROBIC Blood Culture adequate volume   Culture   Final    NO GROWTH 4 DAYS Performed at [facility], [address], [place], [place] [address]    Report Status PENDING  Incomplete  MRSA Next Gen by PCR, Nasal     Status: None   Collection Time: 09/11/22  9:11 AM   Specimen: Nasal Mucosa; Nasal Swab  Result Value Ref Range Status   MRSA by PCR Next Gen NOT DETECTED NOT DETECTED Final    Comment: (NOTE) The GeneXpert MRSA Assay (FDA approved for NASAL specimens only), is one component of a comprehensive MRSA colonization surveillance program. It is not intended to diagnose MRSA infection nor to guide or monitor treatment for MRSA infections. Test performance is not FDA approved in patients less than 5 years old. Performed at [facility], [address], [place], [place] [address]       Radiology Studies: No results found.    LOS: 7 days    Jacquelin Hawking, MD [facility] 09/14/2022, 2:06 PM   If 7PM-7AM, please contact night-coverage [email or web address]

## 2022-09-14 NOTE — Progress Notes (Signed)
Assessment & Plan: HD#8 - SBO - CT AP with SBO with transition in R anterior abdomen in area of mild bowel wall thickening - ?infections vs inflammatory enteritis vs neoplastic infiltration - AXR with protocol shows contrast to colon consistent with partial obstruction  - pt with persistent loose BM's - advance to full liquid diet   FEN: FLD   VTE: SCDs ID: [id number] for bacteremia    - per TRH -  L5-S1 discitis/osteomyelitis  MSSA bacteremia  ESRD on HD HTN Lung nodules         [name], MD [facility] practice Office: [phone]        Chief Complaint: SBO  Subjective: Patient in bed, comfortable.  Tolerating clear liquids, requests "soup".  Loose BM's this AM per patient.  Objective: Vital signs in last 24 hours: Temp:  [98 F (36.7 C)-99.3 F (37.4 C)] 98 F (36.7 C) (06/23 0908) Pulse Rate:  [79-82] 80 (06/23 0952) Resp:  [16-18] 18 (06/23 0908) BP: (81-93)/(56-65) 81/58 (06/23 0952) SpO2:  [92 %-95 %] 95 % (06/23 0908) Last BM Date : 09/14/22  Intake/Output from previous day: 06/22 0701 - 06/23 0700 In: 940 [P.O.:840; IV Piggyback:100] Out: 0  Intake/Output this shift: Total I/O In: 35.3 [IV Piggyback:35.3] Out: -   Physical Exam: HEENT - sclerae clear, mucous membranes moist Abdomen - soft without distension; non-tender; no mass  Lab Results:  Recent Labs    09/12/22 1755 09/13/22 0329  WBC 27.5* 27.0*  HGB 10.2* 10.3*  HCT 29.7* 29.7*  PLT 230 256   BMET Recent Labs    09/12/22 1756 09/13/22 0329  NA 129* 131*  K 5.4* 4.6  CL 86* 92*  CO2 18* 22  GLUCOSE 119* 120*  BUN 135* 59*  CREATININE 13.36* 7.83*  CALCIUM 8.6* 8.0*   PT/INR No results for input(s): "LABPROT", "INR" in the last 72 hours. Comprehensive Metabolic Panel:    Component Value Date/Time   NA 131 (L) 09/13/2022 0329   NA 129 (L) 09/12/2022 1756   K 4.6 09/13/2022 0329   K 5.4 (H) 09/12/2022 1756   CL 92 (L) 09/13/2022 0329    CL 86 (L) 09/12/2022 1756   CO2 22 09/13/2022 0329   CO2 18 (L) 09/12/2022 1756   BUN 59 (H) 09/13/2022 0329   BUN 135 (H) 09/12/2022 1756   CREATININE 7.83 (H) 09/13/2022 0329   CREATININE 13.36 (H) 09/12/2022 1756   GLUCOSE 120 (H) 09/13/2022 0329   GLUCOSE 119 (H) 09/12/2022 1756   CALCIUM 8.0 (L) 09/13/2022 0329   CALCIUM 8.6 (L) 09/12/2022 1756   AST 82 (H) 09/08/2022 1128   AST 53 (H) 09/07/2022 0215   ALT 58 (H) 09/08/2022 1128   ALT 34 09/07/2022 0215   ALKPHOS 106 09/08/2022 1128   ALKPHOS 86 09/07/2022 0215   BILITOT 0.9 09/08/2022 1128   BILITOT 0.6 09/07/2022 0215   PROT 8.0 09/08/2022 1128   PROT 8.3 (H) 09/07/2022 0215   ALBUMIN 1.9 (L) 09/13/2022 0329   ALBUMIN 2.0 (L) 09/12/2022 1756    Studies/Results: IR Fluoro Guide CV Line Right  Result Date: 09/12/2022 INDICATION: History of end-stage renal disease, now admitted with bacteremia, post removal of tunneled right internal jugular approach dialysis catheter on 09/09/2022. Unfortunately, patient has white blood cell count remains persistently elevated and as such she presents today for image guided placement of a temporary hemodialysis catheter to facilitate continued dialysis. Patient with history of contrast allergy though is  not received a preprocedural steroid prep. EXAM: NON-TUNNELED CENTRAL VENOUS HEMODIALYSIS CATHETER PLACEMENT WITH ULTRASOUND AND FLUOROSCOPIC GUIDANCE COMPARISON:  Dialysis catheter removal-09/09/2022; chest radiograph-12/29/2017 MEDICATIONS: None FLUOROSCOPY TIME:  3 minutes, 18 seconds (123 mGy) COMPLICATIONS: None immediate. PROCEDURE: Informed written consent was obtained from the patient after a discussion of the risks, benefits, and alternatives to treatment. Questions regarding the procedure were encouraged and answered. The right neck and chest were prepped with chlorhexidine in a sterile fashion, and a sterile drape was applied covering the operative field. Maximum barrier sterile technique  with sterile gowns and gloves were used for the procedure. A timeout was performed prior to the initiation of the procedure. After the overlying soft tissues were anesthetized, a small venotomy incision was created and a micropuncture kit was utilized to access the internal jugular vein. Real-time ultrasound guidance was utilized for vascular access including the acquisition of a permanent ultrasound image documenting patency of the accessed vessel. There was difficulty advancing the microwire through the SVC. As such, stiff glidewire was cannulated through the outer sheath of the micropuncture kit however again, could not be advanced to the SVC. Given patient's history of contrast allergy, intravenous contrast was not administered. As such, the decision was made to proceed with right common femoral vein approach temporary dialysis catheter placement to facilitate dialysis. The right groin was prepped and draped in usual sterile fashion. The right femoral head was marked fluoroscopically. Under direct ultrasound guidance, the right common femoral vein was accessed with a micropuncture kit after the overlying soft tissues were anesthetized with 1% lidocaine within epinephrine. An ultrasound image was saved documenting patency of the accessed vessel. Under fluoroscopic guidance, the track was dilated ultimately allowing placement of a The microwire was utilized to measure appropriate catheter length. A stiff glidewire was advanced to the level of the IVC. Under fluoroscopic guidance, the venotomy was serially dilated, ultimately allowing placement of a 24 cm temporary [place] dialysis catheter with tip ultimately terminating within the inferior aspect of the IVC. Final catheter positioning was confirmed and documented with a spot fluoroscopic image. The catheter aspirates and flushes normally. The catheter was flushed with appropriate volume heparin dwells. The catheter exit site was secured with a 0-Prolene  retention suture. A dressing was placed. The patient tolerated the procedure well without immediate post procedural complication. IMPRESSION: 1. Attempted though ultimately unsuccessful placement of right internal jugular approach temporary hemodialysis catheter secondary to concern for SVC occlusion. Contrast was unable to be administered secondary to patient's history of contrast allergy and lack of preprocedural steroid prep. 2. Successful placement of a right common femoral vein approach 24 cm temporary dialysis catheter with tip terminating within the inferior aspect of the IVC. The catheter is ready for immediate use. PLAN: Recommend correlating with interventional radiology for once the patient's bacteremia has cleared, the patient will need a steroid prep prior to attempted left internal jugular approach hemodialysis catheter placement as this procedure will likely require the administration of contrast. Ultimately, if the patient's SVC is found to be occluded and unable to be traversed, the right common femoral vein dialysis catheter may be converted to a tunneled catheter as indicated. Electronically Signed   By: Simonne Come M.D.   On: 09/12/2022 16:16   IR US Guide Vasc Access Right  Result Date: 09/12/2022 INDICATION: History of end-stage renal disease, now admitted with bacteremia, post removal of tunneled right internal jugular approach dialysis catheter on 09/09/2022. Unfortunately, patient has white blood cell count remains persistently elevated  and as such she presents today for image guided placement of a temporary hemodialysis catheter to facilitate continued dialysis. Patient with history of contrast allergy though is not received a preprocedural steroid prep. EXAM: NON-TUNNELED CENTRAL VENOUS HEMODIALYSIS CATHETER PLACEMENT WITH ULTRASOUND AND FLUOROSCOPIC GUIDANCE COMPARISON:  Dialysis catheter removal-09/09/2022; chest radiograph-12/29/2017 MEDICATIONS: None FLUOROSCOPY TIME:  3 minutes,  18 seconds (123 mGy) COMPLICATIONS: None immediate. PROCEDURE: Informed written consent was obtained from the patient after a discussion of the risks, benefits, and alternatives to treatment. Questions regarding the procedure were encouraged and answered. The right neck and chest were prepped with chlorhexidine in a sterile fashion, and a sterile drape was applied covering the operative field. Maximum barrier sterile technique with sterile gowns and gloves were used for the procedure. A timeout was performed prior to the initiation of the procedure. After the overlying soft tissues were anesthetized, a small venotomy incision was created and a micropuncture kit was utilized to access the internal jugular vein. Real-time ultrasound guidance was utilized for vascular access including the acquisition of a permanent ultrasound image documenting patency of the accessed vessel. There was difficulty advancing the microwire through the SVC. As such, stiff glidewire was cannulated through the outer sheath of the micropuncture kit however again, could not be advanced to the SVC. Given patient's history of contrast allergy, intravenous contrast was not administered. As such, the decision was made to proceed with right common femoral vein approach temporary dialysis catheter placement to facilitate dialysis. The right groin was prepped and draped in usual sterile fashion. The right femoral head was marked fluoroscopically. Under direct ultrasound guidance, the right common femoral vein was accessed with a micropuncture kit after the overlying soft tissues were anesthetized with 1% lidocaine within epinephrine. An ultrasound image was saved documenting patency of the accessed vessel. Under fluoroscopic guidance, the track was dilated ultimately allowing placement of a The microwire was utilized to measure appropriate catheter length. A stiff glidewire was advanced to the level of the IVC. Under fluoroscopic guidance, the  venotomy was serially dilated, ultimately allowing placement of a 24 cm temporary [place] dialysis catheter with tip ultimately terminating within the inferior aspect of the IVC. Final catheter positioning was confirmed and documented with a spot fluoroscopic image. The catheter aspirates and flushes normally. The catheter was flushed with appropriate volume heparin dwells. The catheter exit site was secured with a 0-Prolene retention suture. A dressing was placed. The patient tolerated the procedure well without immediate post procedural complication. IMPRESSION: 1. Attempted though ultimately unsuccessful placement of right internal jugular approach temporary hemodialysis catheter secondary to concern for SVC occlusion. Contrast was unable to be administered secondary to patient's history of contrast allergy and lack of preprocedural steroid prep. 2. Successful placement of a right common femoral vein approach 24 cm temporary dialysis catheter with tip terminating within the inferior aspect of the IVC. The catheter is ready for immediate use. PLAN: Recommend correlating with interventional radiology for once the patient's bacteremia has cleared, the patient will need a steroid prep prior to attempted left internal jugular approach hemodialysis catheter placement as this procedure will likely require the administration of contrast. Ultimately, if the patient's SVC is found to be occluded and unable to be traversed, the right common femoral vein dialysis catheter may be converted to a tunneled catheter as indicated. Electronically Signed   By: Simonne Come M.D.   On: 09/12/2022 16:16      [name] 09/14/2022  Patient ID: Ryan [name], male  DOB: 07-27-1960, 62 y.o.   MRN: [id number]

## 2022-09-15 DIAGNOSIS — R197 Diarrhea, unspecified: Secondary | ICD-10-CM | POA: Diagnosis not present

## 2022-09-15 DIAGNOSIS — K5669 Other partial intestinal obstruction: Secondary | ICD-10-CM | POA: Diagnosis not present

## 2022-09-15 DIAGNOSIS — N250 Renal osteodystrophy: Secondary | ICD-10-CM | POA: Diagnosis not present

## 2022-09-15 DIAGNOSIS — D72829 Elevated white blood cell count, unspecified: Secondary | ICD-10-CM

## 2022-09-15 DIAGNOSIS — J3489 Other specified disorders of nose and nasal sinuses: Secondary | ICD-10-CM | POA: Diagnosis not present

## 2022-09-15 DIAGNOSIS — I120 Hypertensive chronic kidney disease with stage 5 chronic kidney disease or end stage renal disease: Secondary | ICD-10-CM | POA: Diagnosis not present

## 2022-09-15 DIAGNOSIS — R7881 Bacteremia: Secondary | ICD-10-CM | POA: Diagnosis not present

## 2022-09-15 DIAGNOSIS — B9561 Methicillin susceptible Staphylococcus aureus infection as the cause of diseases classified elsewhere: Secondary | ICD-10-CM | POA: Diagnosis not present

## 2022-09-15 DIAGNOSIS — M4647 Discitis, unspecified, lumbosacral region: Secondary | ICD-10-CM | POA: Diagnosis not present

## 2022-09-15 DIAGNOSIS — Z992 Dependence on renal dialysis: Secondary | ICD-10-CM | POA: Diagnosis not present

## 2022-09-15 DIAGNOSIS — N186 End stage renal disease: Secondary | ICD-10-CM | POA: Diagnosis not present

## 2022-09-15 DIAGNOSIS — D631 Anemia in chronic kidney disease: Secondary | ICD-10-CM | POA: Diagnosis not present

## 2022-09-15 DIAGNOSIS — M545 Low back pain, unspecified: Secondary | ICD-10-CM | POA: Diagnosis not present

## 2022-09-15 LAB — RENAL FUNCTION PANEL
Albumin: 1.7 g/dL — ABNORMAL LOW (ref 3.5–5.0)
Anion gap: 19 — ABNORMAL HIGH (ref 5–15)
BUN: 86 mg/dL — ABNORMAL HIGH (ref 8–23)
CO2: 19 mmol/L — ABNORMAL LOW (ref 22–32)
Calcium: 7.8 mg/dL — ABNORMAL LOW (ref 8.9–10.3)
Chloride: 91 mmol/L — ABNORMAL LOW (ref 98–111)
Creatinine, Ser: 11.52 mg/dL — ABNORMAL HIGH (ref 0.61–1.24)
GFR, Estimated: 5 mL/min — ABNORMAL LOW (ref >60)
Glucose, Bld: 110 mg/dL — ABNORMAL HIGH (ref 70–99)
Phosphorus: 10.6 mg/dL — ABNORMAL HIGH (ref 2.5–4.6)
Potassium: 4.7 mmol/L (ref 3.5–5.1)
Sodium: 129 mmol/L — ABNORMAL LOW (ref 135–145)

## 2022-09-15 LAB — CULTURE, BLOOD (ROUTINE X 2): Special Requests: ADEQUATE

## 2022-09-15 MED ORDER — CEFAZOLIN SODIUM-DEXTROSE 2-4 GM/100ML-% IV SOLN
2.0000 g | INTRAVENOUS | Status: DC
  Administered 2022-09-16 – 2022-09-20 (×3): 2 g via INTRAVENOUS
  Filled 2022-09-15 (×4): qty 100

## 2022-09-15 MED ORDER — OXYCODONE HCL 5 MG PO TABS
5.0000 mg | ORAL_TABLET | ORAL | Status: DC | PRN
  Administered 2022-09-15 – 2022-09-17 (×5): 10 mg via ORAL
  Administered 2022-09-18: 5 mg via ORAL
  Administered 2022-09-19 – 2022-09-24 (×7): 10 mg via ORAL
  Filled 2022-09-15 (×8): qty 2
  Filled 2022-09-15: qty 1
  Filled 2022-09-15 (×4): qty 2

## 2022-09-15 MED ORDER — HYDROMORPHONE HCL 1 MG/ML IJ SOLN
0.5000 mg | INTRAMUSCULAR | Status: DC | PRN
  Administered 2022-09-15 – 2022-09-22 (×7): 1 mg via INTRAVENOUS
  Filled 2022-09-15 (×9): qty 1

## 2022-09-15 MED ORDER — CALCITRIOL 0.5 MCG PO CAPS
0.5000 ug | ORAL_CAPSULE | ORAL | Status: DC
  Administered 2022-09-16 – 2022-09-20 (×3): 0.5 ug via ORAL
  Filled 2022-09-15 (×3): qty 1

## 2022-09-15 MED ORDER — PROSOURCE PLUS PO LIQD
30.0000 mL | Freq: Two times a day (BID) | ORAL | Status: DC
  Administered 2022-09-15 – 2022-09-23 (×15): 30 mL via ORAL
  Filled 2022-09-15 (×14): qty 30

## 2022-09-15 NOTE — Progress Notes (Signed)
   09/15/22 1620  Spiritual Encounters  Type of Visit Initial  Care provided to: Patient  OnCall Visit No   Chap responded to consult to visit Pt. Pt shared about feeling recharged and renewed, because of his faith beliefs. Pt trusts his faith is transforming him and allowing him to endure suffering. 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 Pt showed difficulties to talk. Pt asked for prayer. Chap and Pt prayed together. Mirna Mires remains available if needed.

## 2022-09-15 NOTE — Progress Notes (Signed)
[facility] Progress Note   Subjective:   Seen in room. Alert, oriented. Still with hiccups.   Objective Vitals:   09/14/22 2051 09/15/22 0427 09/15/22 0620 09/15/22 0904  BP: (!) 95/55 (!) 92/51 123/66 (!) 102/58  Pulse: 78 64 73 75  Resp: 18 18 18 18   Temp: 98.5 F (36.9 C) 98.3 F (36.8 C) 97.6 F (36.4 C) 98.4 F (36.9 C)  TempSrc: Oral  Oral   SpO2: 95% 97% 92% 91%  Weight:      Height:       Physical Exam General: lying in bed, appears uncomfortable Heart: RRR, no mrg Lungs:CTAB, nml WOB on RA Abdomen:soft, NTND Extremities:no LE edema Dialysis Access: temp cath R groin   Filed Weights   09/09/22 1155 09/12/22 1623 09/12/22 2103  Weight: 81 kg 84.2 kg 81.2 kg    Intake/Output Summary (Last 24 hours) at 09/15/2022 0912 Last data filed at 09/15/2022 0600 Gross per 24 hour  Intake 747 ml  Output 0 ml  Net 747 ml     Additional Objective Labs: Basic Metabolic Panel: Recent Labs  Lab 09/12/22 1756 09/13/22 0329 09/15/22 0127  NA 129* 131* 129*  K 5.4* 4.6 4.7  CL 86* 92* 91*  CO2 18* 22 19*  GLUCOSE 119* 120* 110*  BUN 135* 59* 86*  CREATININE 13.36* 7.83* 11.52*  CALCIUM 8.6* 8.0* 7.8*  PHOS 8.2* 6.2* 10.6*    Liver Function Tests: Recent Labs  Lab 09/08/22 1128 09/12/22 1756 09/13/22 0329 09/15/22 0127  AST 82*  --   --   --   ALT 58*  --   --   --   ALKPHOS 106  --   --   --   BILITOT 0.9  --   --   --   PROT 8.0  --   --   --   ALBUMIN 2.7* 2.0* 1.9* 1.7*    CBC: Recent Labs  Lab 09/10/22 0439 09/11/22 0436 09/12/22 1755 09/13/22 0329 09/14/22 1139  WBC 23.9* 28.5* 27.5* 27.0* 31.4*  NEUTROABS  --  23.6*  --   --   --   HGB 10.5* 10.9* 10.2* 10.3* 9.7*  HCT 31.6* 32.3* 29.7* 29.7* 28.5*  MCV 82.9 81.6 81.6 81.6 81.0  PLT 161 157 230 256 267    Blood Culture    Component Value Date/Time   SDES BLOOD RIGHT ANTECUBITAL 09/10/2022 1516   SDES BLOOD LEFT ARM 09/10/2022 1516   SPECREQUEST  09/10/2022 1516     BOTTLES DRAWN AEROBIC AND ANAEROBIC Blood Culture adequate volume   SPECREQUEST  09/10/2022 1516    BOTTLES DRAWN AEROBIC AND ANAEROBIC Blood Culture adequate volume   CULT  09/10/2022 1516    NO GROWTH 5 DAYS Performed at [facility], [address] [place], [place] [address]    CULT  09/10/2022 1516    NO GROWTH 5 DAYS Performed at [facility], [address], [place], [place] [address]    REPTSTATUS 09/15/2022 FINAL 09/10/2022 1516   REPTSTATUS 09/15/2022 FINAL 09/10/2022 1516    CBG: Recent Labs  Lab 09/11/22 1809  GLUCAP 117*    Iron Studies: No results for input(s): "IRON", "TIBC", "TRANSFERRIN", "FERRITIN" in the last 72 hours. Lab Results  Component Value Date   INR 1.4 (H) 09/10/2022   INR 1.13 09/23/2017   INR 1.01 08/28/2016   Studies/Results: No results found.  Medications:   ceFAZolin (ANCEF) IV 1 g (09/14/22 1703)   chlorproMAZINE (  THORAZINE) 12.5 mg in sodium chloride 0.9 % 25 mL IVPB 12.5 mg (09/14/22 1858)   methocarbamol (ROBAXIN) IV 500 mg (09/11/22 2229)    atenolol  50 mg Oral Daily   Chlorhexidine Gluconate Cloth  6 each Topical Daily   cinacalcet  30 mg Oral Q supper   multivitamin  1 tablet Oral QHS   pantoprazole  40 mg Oral Daily   sodium chloride flush  3 mL Intravenous Q12H   sodium zirconium cyclosilicate  10 g Oral Daily    Dialysis Orders: [facility] TTS 3:45 450/A1.5x 2K/2Ca EDW 85.7kg TDC  -Heparin 3000 U  -No ESA -Calcitriol 1.5      Assessment/Plan: 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 ID following. Repeat blood cultures ordered-+ MSSA.  On cefazolin. TDC removed 6/18 -ID recommends 72 hour line holiday. TTE concerning for endocarditis. TEE cancelled d/t high risk factors. Rpt blood cultures 6/19 -ng to date. WBCs 28->31. Appreciate ID input. Temp cath placed 6/21 following line holiday.  Appreciate IR assistance.  Dialysis Access: non-tunneled HD catheter via right CFV. Per IR notes unable to  place [facility] d/t occlusion of SVC that was incompletely evaluated d/t contrast allergy.  IR recommend consulting them for steroid prep prior to attempted L IJ TDC vs conversion of R fem to [facility].   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 Surgery consulting.  ESRD -  HD TTS.   HD off schedule overnight Friday.  Plan for next HD Tuesday.  Volume status stable.   Access -  Had L AVF ligation in 2019. Last seen by Dr. Edilia Bo 01/2022 -limited mobility in R arm so planning for L arm graft but had not scheduled surgery yet.   Hypertension/volume  - BP/volume acceptable. Under dry weight. Does not appear volume overloaded, UF as tolerated. Anemia  - Hb 9.7  Not on ESA currently -follow trends  Metabolic bone disease -  Corr Ca acceptable. Phos 10.6 --d/c'd sevelamer d/t SBO. Will need to start a new binder when eating solid food again.  Nutrition - Renal diet when eating, currently on liquids diet. Add prot supp.   Tomasa Blase PA-C [facility] 09/15/2022,9:12 AM

## 2022-09-15 NOTE — Progress Notes (Signed)
PROGRESS NOTE    Ryan [name]  JJO:[id number] DOB: 07-09-1960 DOA: 09/07/2022 PCP: Sharin Grave, MD   Brief Narrative: Ryan [name] is a 62 y.o. male with a history of hypertension, hyperlipidemia, ESRD on hemodialysis.  Patient presented secondary to low back pain and was found to have evidence of L5-S1 discitis/osteomyelitis in addition to MSSA bacteremia.  ID consulted.  Workup significant for possible endocarditis. Patient also found to have a small bowel obstruction.   Assessment/Plan:  L5-S1 discitis/osteomyelitis MSSA bacteremia Confirmed possible early discitis/osteomyelitis on MRI imaging.  Blood cultures positive for MSSA.  Patient was empirically treated with vancomycin and cefepime.  Infectious disease consulted.  Patient transition to cefazolin IV with hemodialysis.  Transthoracic echocardiogram significant for a large, rounded structure on the anterior leaflet of the mitral valve concerning for endocarditis.  ID recommending transesophageal echocardiogram; cardiology consulted. Transesophageal Echocardiogram deferred secondary to risk/benefit profile. 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 Leukocytosis slightly worsened. -Infectious disease recommendations: Cefazolin IV with hemodialysis with plan for 6 weeks of treatment from 6/18  ESRD on hemodialysis Nephrology consulted.  Patient presented with a tunneled dialysis catheter which was removed on 6/18 secondary to bacteremia.  Per ID, patient requires a 72-hour line holiday.  Patient last received hemodialysis on 6/18. Interventional radiology consulted and placed non-tunneled HD cath on 6/21. Hemodialysis resumed. -Nephrology recommendations: pending today -IR recommendations (6/21): recommend coordinating with the IR service for sterioid prep prior to attempted left internal jugular approach HD catheter placement versus conversion of right CFV temp  catheter to tunneled HD catheter.   Intractable hiccups Patient started on Thorazine as needed. Possibly related to uremia. Recurrent.  Not improving with therapies.  Small bowel obstruction Patient without a bowel movement for several days initially.  Diagnosis confirmed on CT scan with transition point located in the right anterior abdomen.  General surgery consulted. Patient underwent SBO protocol and gastrografin made it to his colon suggesting at most a partial small bowel obstruction at that time, although per general surgery, possible ileus related to possible gastroenteritis. Symptoms appear to be resolved. -General surgery recommendations: Advance diet as tolerated  Primary hypertension Patient with mild hypotension this morning.  Atenolol held.  Per nephrology, patient is under dry weight which likely is contributing to lower blood pressures. Improved with small NS IV fluid bolus.  Elevated troponin Previously noted. No associated chest pain. Presumed secondary to demand ischemia.  Lung nodules Noted on initial CT scan.  Initial recommendation for repeat CT scan of the chest in 18 to 24 months.  Repeat CT scan on 6/20 showed multiple new pulmonary nodules compatible with likely active infection and/or aspiration.  Patient without symptoms to suggest pneumonia although it is likely these nodules are septic in etiology.  Bladder wall thickening Noted incidentally on CT imaging.  No urinary symptoms and patient does not make urine secondary to ESRD. Unclear if this correlates with isolated infection or is related to overall infectious picture.   DVT prophylaxis: SCDs Code Status:   Code Status: Full Code Family Communication: None at bedside Disposition Plan: Discharge home pending completion of endocarditis workup/management, and outpatient antibiotic regimen and hemodialysis recommendations   Consultants:  Infectious disease Nephrology Interventional radiology Cardiothoracic  surgery  Procedures:  Hemodialysis 6/18: Successful removal of tunneled hemodialysis catheter  6/21: Successful placement of a non-tunneled HD catheter via the right CFV with tips terminating within the inferior aspect of the IVC   Antimicrobials: Vancomycin IV Flagyl IV  Cefepime IV Cefazolin IV   Subjective: Continued hiccups and back pain.  Objective: BP (!) 92/59 (BP Location: Right Arm)   Pulse 77   Temp 98.7 F (37.1 C) (Oral)   Resp 18   Ht 5\' 9"  (1.753 m)   Wt 81.2 kg   SpO2 93%   BMI 26.44 kg/m   Examination:  General exam: Appears calm and comfortable Respiratory system: Clear to auscultation. Respiratory effort normal. Cardiovascular system: S1 & S2 heard, RRR. Gastrointestinal system: Abdomen is nondistended, soft and nontender. Normal bowel sounds heard. Central nervous system: Alert and oriented.  Musculoskeletal: No edema. No calf tenderness Psychiatry: Judgement and insight appear normal. Mood & affect appropriate.   Data Reviewed: I have personally reviewed following labs and imaging studies   Last CBC Lab Results  Component Value Date   WBC 31.4 (H) 09/14/2022   HGB 9.7 (L) 09/14/2022   HCT 28.5 (L) 09/14/2022   MCV 81.0 09/14/2022   MCH 27.6 09/14/2022   RDW 15.5 09/14/2022   PLT 267 09/14/2022     Last metabolic panel Lab Results  Component Value Date   GLUCOSE 110 (H) 09/15/2022   NA 129 (L) 09/15/2022   K 4.7 09/15/2022   CL 91 (L) 09/15/2022   CO2 19 (L) 09/15/2022   BUN 86 (H) 09/15/2022   CREATININE 11.52 (H) 09/15/2022   GFRNONAA 5 (L) 09/15/2022   CALCIUM 7.8 (L) 09/15/2022   PHOS 10.6 (H) 09/15/2022   PROT 8.0 09/08/2022   ALBUMIN 1.7 (L) 09/15/2022   BILITOT 0.9 09/08/2022   ALKPHOS 106 09/08/2022   AST 82 (H) 09/08/2022   ALT 58 (H) 09/08/2022   ANIONGAP 19 (H) 09/15/2022     Creatinine Clearance: Estimated Creatinine Clearance: 6.7 mL/min (A) (by C-G formula based on SCr of 11.52 mg/dL (H)).  Recent Results  (from the past 240 hour(s))  Blood culture (routine x 2)     Status: Abnormal   Collection Time: 09/07/22  2:15 AM   Specimen: BLOOD  Result Value Ref Range Status   Specimen Description   Final    BLOOD LEFT ANTECUBITAL Performed at [facility], [address] [place], [place] [address]    Special Requests   Final    BOTTLES DRAWN AEROBIC AND ANAEROBIC Blood Culture adequate volume Performed at [facility], [address], [place], [place] [address]    Culture  Setup Time   Final    GRAM POSITIVE COCCI IN BOTH AEROBIC AND ANAEROBIC BOTTLES CRITICAL VALUE NOTED.  VALUE IS CONSISTENT WITH PREVIOUSLY REPORTED AND CALLED VALUE.    Culture (A)  Final    STAPHYLOCOCCUS AUREUS SUSCEPTIBILITIES PERFORMED ON PREVIOUS CULTURE WITHIN THE LAST 5 DAYS. Performed at [facility], [address], [place], [place] [address]    Report Status 09/09/2022 FINAL  Final  Blood culture (routine x 2)     Status: Abnormal   Collection Time: 09/07/22  2:51 AM   Specimen: BLOOD  Result Value Ref Range Status   Specimen Description   Final    BLOOD BLOOD RIGHT HAND Performed at [facility], [address] [place], [place] [address]    Special Requests   Final    BOTTLES DRAWN AEROBIC AND ANAEROBIC Blood Culture adequate volume Performed at [facility], [address], [place], [place] [address]    Culture  Setup Time   Final    GRAM POSITIVE COCCI IN BOTH AEROBIC AND ANAEROBIC  BOTTLES CRITICAL RESULT CALLED TO, READ BACK BY AND VERIFIED WITH: PHARMD NICK GLOGOVAC [id number] 1503 BY EC Performed at [facility], [address] [place], [place] [address]    Culture STAPHYLOCOCCUS AUREUS (A)  Final   Report Status 09/09/2022 FINAL  Final   Organism ID, Bacteria STAPHYLOCOCCUS AUREUS  Final      Susceptibility   Staphylococcus aureus - MIC*    CIPROFLOXACIN <=0.5 SENSITIVE Sensitive     ERYTHROMYCIN <=0.25  SENSITIVE Sensitive     GENTAMICIN <=0.5 SENSITIVE Sensitive     OXACILLIN 0.5 SENSITIVE Sensitive     TETRACYCLINE <=1 SENSITIVE Sensitive     VANCOMYCIN 1 SENSITIVE Sensitive     TRIMETH/SULFA <=10 SENSITIVE Sensitive     CLINDAMYCIN <=0.25 SENSITIVE Sensitive     RIFAMPIN <=0.5 SENSITIVE Sensitive     Inducible Clindamycin NEGATIVE Sensitive     LINEZOLID 2 SENSITIVE Sensitive     * STAPHYLOCOCCUS AUREUS  Blood Culture ID Panel (Reflexed)     Status: Abnormal   Collection Time: 09/07/22  2:51 AM  Result Value Ref Range Status   Enterococcus faecalis NOT DETECTED NOT DETECTED Final   Enterococcus Faecium NOT DETECTED NOT DETECTED Final   Listeria monocytogenes NOT DETECTED NOT DETECTED Final   Staphylococcus species DETECTED (A) NOT DETECTED Final    Comment: CRITICAL RESULT CALLED TO, READ BACK BY AND VERIFIED WITH: PHARMD NICK GLOGOVAC [id number] AT 1503 BY EC    Staphylococcus aureus (BCID) DETECTED (A) NOT DETECTED Final    Comment: CRITICAL RESULT CALLED TO, READ BACK BY AND VERIFIED WITH: PHARMD NICK GLOGOVAC [id number] AT 1503 BY EC    Staphylococcus epidermidis NOT DETECTED NOT DETECTED Final   Staphylococcus lugdunensis NOT DETECTED NOT DETECTED Final   Streptococcus species NOT DETECTED NOT DETECTED Final   Streptococcus agalactiae NOT DETECTED NOT DETECTED Final   Streptococcus pneumoniae NOT DETECTED NOT DETECTED Final   Streptococcus pyogenes NOT DETECTED NOT DETECTED Final   A.calcoaceticus-baumannii NOT DETECTED NOT DETECTED Final   Bacteroides fragilis NOT DETECTED NOT DETECTED Final   Enterobacterales NOT DETECTED NOT DETECTED Final   Enterobacter cloacae complex NOT DETECTED NOT DETECTED Final   Escherichia coli NOT DETECTED NOT DETECTED Final   Klebsiella aerogenes NOT DETECTED NOT DETECTED Final   Klebsiella oxytoca NOT DETECTED NOT DETECTED Final   Klebsiella pneumoniae NOT DETECTED NOT DETECTED Final   Proteus species NOT DETECTED NOT DETECTED Final    Salmonella species NOT DETECTED NOT DETECTED Final   Serratia marcescens NOT DETECTED NOT DETECTED Final   Haemophilus influenzae NOT DETECTED NOT DETECTED Final   Neisseria meningitidis NOT DETECTED NOT DETECTED Final   Pseudomonas aeruginosa NOT DETECTED NOT DETECTED Final   Stenotrophomonas maltophilia NOT DETECTED NOT DETECTED Final   Candida albicans NOT DETECTED NOT DETECTED Final   Candida auris NOT DETECTED NOT DETECTED Final   Candida glabrata NOT DETECTED NOT DETECTED Final   Candida krusei NOT DETECTED NOT DETECTED Final   Candida parapsilosis NOT DETECTED NOT DETECTED Final   Candida tropicalis NOT DETECTED NOT DETECTED Final   Cryptococcus neoformans/gattii NOT DETECTED NOT DETECTED Final   Meth resistant mecA/C and MREJ NOT DETECTED NOT DETECTED Final    Comment: Performed at [facility], [address], [place], [place] [address]  Respiratory (~20 pathogens) panel by PCR     Status: None   Collection Time: 09/07/22  5:45 AM   Specimen: Anterior Nasal Swab; Respiratory  Result Value Ref Range Status   Adenovirus  NOT DETECTED NOT DETECTED Final   Coronavirus 229E NOT DETECTED NOT DETECTED Final    Comment: (NOTE) The Coronavirus on the Respiratory Panel, DOES NOT test for the novel  Coronavirus (2019 nCoV)    Coronavirus HKU1 NOT DETECTED NOT DETECTED Final   Coronavirus NL63 NOT DETECTED NOT DETECTED Final   Coronavirus OC43 NOT DETECTED NOT DETECTED Final   Metapneumovirus NOT DETECTED NOT DETECTED Final   Rhinovirus / Enterovirus NOT DETECTED NOT DETECTED Final   Influenza A NOT DETECTED NOT DETECTED Final   Influenza B NOT DETECTED NOT DETECTED Final   Parainfluenza Virus 1 NOT DETECTED NOT DETECTED Final   Parainfluenza Virus 2 NOT DETECTED NOT DETECTED Final   Parainfluenza Virus 3 NOT DETECTED NOT DETECTED Final   Parainfluenza Virus 4 NOT DETECTED NOT DETECTED Final   Respiratory Syncytial Virus NOT DETECTED NOT DETECTED Final   Bordetella pertussis  NOT DETECTED NOT DETECTED Final   Bordetella Parapertussis NOT DETECTED NOT DETECTED Final   Chlamydophila pneumoniae NOT DETECTED NOT DETECTED Final   Mycoplasma pneumoniae NOT DETECTED NOT DETECTED Final    Comment: Performed at [facility], [address], [place], [place] [address]  SARS Coronavirus 2 by RT PCR (hospital order, performed in [facility] hospital lab) *cepheid single result test* Anterior Nasal Swab     Status: None   Collection Time: 09/07/22  5:45 AM   Specimen: Anterior Nasal Swab  Result Value Ref Range Status   SARS Coronavirus 2 by RT PCR NEGATIVE NEGATIVE Final    Comment: (NOTE) SARS-CoV-2 target nucleic acids are NOT DETECTED.  The SARS-CoV-2 RNA is generally detectable in upper and lower respiratory specimens during the acute phase of infection. The lowest concentration of SARS-CoV-2 viral copies this assay can detect is 250 copies / mL. A negative result does not preclude SARS-CoV-2 infection and should not be used as the sole basis for treatment or other patient management decisions.  A negative result may occur with improper specimen collection / handling, submission of specimen other than nasopharyngeal swab, presence of viral mutation(s) within the areas targeted by this assay, and inadequate number of viral copies (<250 copies / mL). A negative result must be combined with clinical observations, patient history, and epidemiological information.  Fact Sheet for Patients:   RoadLapTop.co.za  Fact Sheet for Healthcare Providers: http://kim-miller.com/  This test is not yet approved or  cleared by the [place] FDA and has been authorized for detection and/or diagnosis of SARS-CoV-2 by FDA under an Emergency Use Authorization (EUA).  This EUA will remain in effect (meaning this test can be used) for the duration of the COVID-19 declaration under Section 564(b)(1) of the Act, 21 U.S.C. section  360bbb-3(b)(1), unless the authorization is terminated or revoked sooner.  Performed at [facility], [address] [place], [place] [address]   Culture, blood (Routine X 2) w Reflex to ID Panel     Status: None   Collection Time: 09/08/22 11:28 AM   Specimen: BLOOD LEFT HAND  Result Value Ref Range Status   Specimen Description BLOOD LEFT HAND  Final   Special Requests   Final    BOTTLES DRAWN AEROBIC AND ANAEROBIC Blood Culture adequate volume   Culture   Final    NO GROWTH 5 DAYS Performed at [facility], [address] [place], [place] [address]    Report Status 09/13/2022 FINAL  Final  Culture, blood (Routine X 2) w Reflex to ID Panel  Status: None   Collection Time: 09/08/22 11:28 AM   Specimen: BLOOD RIGHT HAND  Result Value Ref Range Status   Specimen Description BLOOD RIGHT HAND  Final   Special Requests   Final    BOTTLES DRAWN AEROBIC AND ANAEROBIC Blood Culture adequate volume   Culture   Final    NO GROWTH 5 DAYS Performed at [facility], [address], [place], [place] [address]    Report Status 09/13/2022 FINAL  Final  Culture, blood (Routine X 2) w Reflex to ID Panel     Status: None   Collection Time: 09/10/22  3:16 PM   Specimen: BLOOD  Result Value Ref Range Status   Specimen Description BLOOD RIGHT ANTECUBITAL  Final   Special Requests   Final    BOTTLES DRAWN AEROBIC AND ANAEROBIC Blood Culture adequate volume   Culture   Final    NO GROWTH 5 DAYS Performed at [facility], [address], [place], [place] [address]    Report Status 09/15/2022 FINAL  Final  Culture, blood (Routine X 2) w Reflex to ID Panel     Status: None   Collection Time: 09/10/22  3:16 PM   Specimen: BLOOD LEFT ARM  Result Value Ref Range Status   Specimen Description BLOOD LEFT ARM  Final   Special Requests   Final    BOTTLES DRAWN AEROBIC AND ANAEROBIC Blood Culture adequate volume   Culture   Final    NO GROWTH 5  DAYS Performed at [facility], [address] [place], [place] [address]    Report Status 09/15/2022 FINAL  Final  MRSA Next Gen by PCR, Nasal     Status: None   Collection Time: 09/11/22  9:11 AM   Specimen: Nasal Mucosa; Nasal Swab  Result Value Ref Range Status   MRSA by PCR Next Gen NOT DETECTED NOT DETECTED Final    Comment: (NOTE) The GeneXpert MRSA Assay (FDA approved for NASAL specimens only), is one component of a comprehensive MRSA colonization surveillance program. It is not intended to diagnose MRSA infection nor to guide or monitor treatment for MRSA infections. Test performance is not FDA approved in patients less than 20 years old. Performed at [facility], [address], [place], [place] [address]       Radiology Studies: No results found.    LOS: 8 days    Jacquelin Hawking, MD [facility] 09/15/2022, 1:11 PM   If 7PM-7AM, please contact night-coverage [email or web address]

## 2022-09-15 NOTE — Progress Notes (Incomplete)
PHARMACY CONSULT NOTE FOR:  OUTPATIENT  PARENTERAL ANTIBIOTIC THERAPY (OPAT)  Informational as the patient will receive antibiotics at the outpatient HD center  Indication: MSSA discitis/IE Regimen: Cefazolin 2g/HD-TTS End date: 11/04/22 (8 wks from line removal 09/09/22)  IV antibiotic discharge orders are pended. To discharging provider:  please sign these orders via discharge navigator,  Select New Orders & click on the button choice - Manage This Unsigned Work.     Thank you for allowing pharmacy to be a part of this patient's care.  Georgina Pillion, PharmD, BCPS, BCIDP Infectious Diseases Clinical Pharmacist 09/16/2022 2:17 PM   **Pharmacist phone directory can now be found on [email or web address] ([email or web address]).  Listed under [facility].

## 2022-09-15 NOTE — Progress Notes (Signed)
4 Days Post-Op   Subjective/Chief Complaint: PT doing well this AM Tol fulls  +BMs   Objective: Vital signs in last 24 hours: Temp:  [97.6 F (36.4 C)-98.7 F (37.1 C)] 97.6 F (36.4 C) (06/24 0620) Pulse Rate:  [64-80] 73 (06/24 0620) Resp:  [18-19] 18 (06/24 0620) BP: (81-123)/(51-66) 123/66 (06/24 0620) SpO2:  [90 %-97 %] 92 % (06/24 0620) Last BM Date : 09/14/22  Intake/Output from previous day: 06/23 0701 - 06/24 0700 In: 782.3 [P.O.:420; I.V.:252; IV Piggyback:110.3] Out: 0  Intake/Output this shift: No intake/output data recorded.  PE:  Constitutional: No acute distress, conversant, appears states age. Eyes: Anicteric sclerae, moist conjunctiva, no lid lag Lungs: Clear to auscultation bilaterally, normal respiratory effort CV: regular rate and rhythm, no murmurs, no peripheral edema, pedal pulses 2+ GI: Soft, no masses or hepatosplenomegaly, non-tender to palpation Skin: No rashes, palpation reveals normal turgor Psychiatric: appropriate judgment and insight, oriented to person, place, and time   Lab Results:  Recent Labs    09/13/22 0329 09/14/22 1139  WBC 27.0* 31.4*  HGB 10.3* 9.7*  HCT 29.7* 28.5*  PLT 256 267   BMET Recent Labs    09/13/22 0329 09/15/22 0127  NA 131* 129*  K 4.6 4.7  CL 92* 91*  CO2 22 19*  GLUCOSE 120* 110*  BUN 59* 86*  CREATININE 7.83* 11.52*  CALCIUM 8.0* 7.8*   PT/INR No results for input(s): "LABPROT", "INR" in the last 72 hours. ABG No results for input(s): "PHART", "HCO3" in the last 72 hours.  Invalid input(s): "PCO2", "PO2"  Studies/Results: No results found.  Anti-infectives: Anti-infectives (From admission, onward)    Start     Dose/Rate Route Frequency Ordered Stop   09/11/22 1800  ceFAZolin (ANCEF) IVPB 1 g/50 mL premix        1 g 100 mL/hr over 30 Minutes Intravenous Daily-1800 09/11/22 0848     09/09/22 1200  ceFAZolin (ANCEF) IVPB 2g/100 mL premix  Status:  Discontinued        2 g 200 mL/hr  over 30 Minutes Intravenous Every T-Th-Sa (Hemodialysis) 09/07/22 1840 09/11/22 0848   09/08/22 0300  ceFEPIme (MAXIPIME) 1 g in sodium chloride 0.9 % 100 mL IVPB  Status:  Discontinued        1 g 200 mL/hr over 30 Minutes Intravenous Every 24 hours 09/07/22 0539 09/07/22 0647   09/07/22 1530  ceFAZolin (ANCEF) IVPB 2g/100 mL premix        2 g 200 mL/hr over 30 Minutes Intravenous  Once 09/07/22 1520 09/07/22 1800   09/07/22 0645  vancomycin (VANCOREADY) IVPB 750 mg/150 mL        750 mg 150 mL/hr over 60 Minutes Intravenous  Once 09/07/22 0639 09/07/22 0909   09/07/22 0538  vancomycin variable dose per unstable renal function (pharmacist dosing)  Status:  Discontinued         Does not apply See admin instructions 09/07/22 0539 09/07/22 0647   09/07/22 0530  metroNIDAZOLE (FLAGYL) IVPB 500 mg  Status:  Discontinued        500 mg 100 mL/hr over 60 Minutes Intravenous Every 12 hours 09/07/22 0515 09/07/22 1551   09/07/22 0200  vancomycin (VANCOCIN) IVPB 1000 mg/200 mL premix        1,000 mg 200 mL/hr over 60 Minutes Intravenous  Once 09/07/22 0155 09/07/22 0622   09/07/22 0200  ceFEPIme (MAXIPIME) 2 g in sodium chloride 0.9 % 100 mL IVPB  2 g 200 mL/hr over 30 Minutes Intravenous  Once 09/07/22 0155 09/07/22 0350       Assessment/Plan: HD#9 - SBO - CT AP with SBO with transition in R anterior abdomen in area of mild bowel wall thickening - ?infections vs inflammatory enteritis vs neoplastic infiltration - tol full liquid diet, OK to adv as tol   FEN: FLD  and adv as tol VTE: SCDs ID: [id number] for bacteremia    - per TRH -  L5-S1 discitis/osteomyelitis  MSSA bacteremia  ESRD on HD HTN Lung nodules   Moderate MDM   LOS: 8 days    Axel Filler 09/15/2022

## 2022-09-15 NOTE — Progress Notes (Signed)
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  MRI showed possible L5-S1 early osteomyelitis. - Patient was febrile with leukocytosis 22 K on arrival.  Respiratory viral panel negative.  He had no respiratory complaints this a.m.  Found to have MSSA bacteremia as well. - No wounds noted.  He does have a right IJ which she notes he has had less than a year.  I suspect this is the source of [facility] removed on 6/18. Non tunneled HD cath place don 6/21 - TTE showed large rounded structure on mitral valve. Concern for endocarditis. - TEE was planned but given given notable lab work BUN 105 was canceled. - CTS engaged and no plans for surgical intervention, recommend antibiotics.  We can consider further workup TEE/cath if ongoing issues. Recommendations:  -Continue cefazolin, anticipate 6 weeks antibiotics with HD from line removal on 6/18 -Follow repeat blood Cx to ensure clearance -Patient with audiology to see plan to do repeat TTE this week versus TEE -Suspect leukocytosis may be in the setting of recent bacteremia and SBO.  #Hiccups - Patient is on Thorazine  #SBO -Patient had not had a bowel movement for several days - CT scan showed concern for SBO with bowl wall thickening unclear etiolgy.  General surgery engaged, patient  underwent SBO protocol noted partial SBO at the time.  -Doing well, having BM. Diet advanced Microbiology:   Antibiotics: Vancomycin6/15 Cefepime 6/15 Metro 6/15 Cefazolin 6/16- Cultures: Blood 6/16 2/2 MSSA 6/17 p no growth  SUBJECTIVE: He has intractable hiccups.  That are deep. Interval: Afebrile overnight. Wbc 31.4k  Review of Systems: Review of Systems  All other systems reviewed and are negative.    Scheduled Meds:  (feeding supplement) PROSource Plus  30 mL Oral BID BM   atenolol  50 mg Oral Daily   [START ON 09/16/2022] calcitRIOL  0.5 mcg Oral Q T,Th,Sat-1800   Chlorhexidine Gluconate Cloth  6 each Topical Daily   cinacalcet  30 mg Oral Q supper   multivitamin  1 tablet Oral QHS   pantoprazole  40 mg Oral Daily   sodium chloride flush  3 mL Intravenous Q12H   Continuous Infusions:   ceFAZolin (ANCEF) IV 1 g (09/14/22 1703)   chlorproMAZINE (THORAZINE) 12.5 mg in sodium chloride 0.9 % 25 mL IVPB 12.5 mg (09/15/22 1150)   methocarbamol (ROBAXIN) IV 500 mg (09/11/22 2229)   PRN Meds:.acetaminophen **OR** acetaminophen, chlorproMAZINE (THORAZINE) 12.5 mg in sodium chloride 0.9 % 25 mL IVPB, guaiFENesin, HYDROmorphone (DILAUDID) injection, lip balm, methocarbamol (ROBAXIN) IV, ondansetron **OR** ondansetron (ZOFRAN) IV, oxyCODONE, senna Allergies  Allergen Reactions   Azithromycin Nausea And Vomiting and Other (See Comments)    Chest tightness  Other Reaction(s): GI Intolerance   Iodinated Contrast Media Itching and Nausea Only  Probably needs prednisone prep prior to contrast    OBJECTIVE: Vitals:   09/15/22 0427 09/15/22 0620 09/15/22 0904 09/15/22 1145  BP: (!) 92/51 123/66 (!) 102/58 (!) 92/59  Pulse: 64 73 75 77  Resp: 18 18 18 18   Temp: 98.3 F (36.8 C) 97.6 F (36.4 C) 98.4 F (36.9 C) 98.7 F (37.1 C)  TempSrc:  Oral  Oral  SpO2: 97% 92% 91% 93%  Weight:      Height:       Body mass index is 26.44 kg/m.  Physical Exam Constitutional:       General: He is not in acute distress.    Appearance: He is normal weight. He is not toxic-appearing.  HENT:     Head: Normocephalic and atraumatic.     Right Ear: External ear normal.     Left Ear: External ear normal.     Nose: No congestion or rhinorrhea.     Mouth/Throat:     Mouth: Mucous membranes are moist.     Pharynx: Oropharynx is clear.  Eyes:     Extraocular Movements: Extraocular movements intact.     Conjunctiva/sclera: Conjunctivae normal.     Pupils: Pupils are equal, round, and reactive to light.  Cardiovascular:     Rate and Rhythm: Normal rate and regular rhythm.     Heart sounds: No murmur heard.    No friction rub. No gallop.  Pulmonary:     Effort: Pulmonary effort is normal.     Breath sounds: Normal breath sounds.  Abdominal:     General: Abdomen is flat. Bowel sounds are normal.     Palpations: Abdomen is soft.  Musculoskeletal:        General: No swelling. Normal range of motion.     Cervical back: Normal range of motion and neck supple.  Skin:    General: Skin is warm and dry.  Neurological:     General: No focal deficit present.     Mental Status: He is oriented to person, place, and time.  Psychiatric:        Mood and Affect: Mood normal.       Lab Results Lab Results  Component Value Date   WBC 31.4 (H) 09/14/2022   HGB 9.7 (L) 09/14/2022   HCT 28.5 (L) 09/14/2022   MCV 81.0 09/14/2022   PLT 267 09/14/2022    Lab Results  Component Value Date   CREATININE 11.52 (H) 09/15/2022   BUN 86 (H) 09/15/2022   NA 129 (L) 09/15/2022   K 4.7 09/15/2022   CL 91 (L) 09/15/2022   CO2 19 (L) 09/15/2022    Lab Results  Component Value Date   ALT 58 (H) 09/08/2022   AST 82 (H) 09/08/2022   ALKPHOS 106 09/08/2022   BILITOT 0.9 09/08/2022        Danelle Earthly, MD [facility] 09/15/2022, 1:24 PM  I have personally spent 52 minutes involved in face-to-face and non-face-to-face  activities for this patient on the day of the visit. Professional time spent includes the following activities: Preparing to see the patient (review of tests), Obtaining and/or reviewing separately obtained history (admission/discharge record), Performing a medically appropriate examination and/or evaluation , Ordering medications/tests/procedures, referring and communicating with other health care professionals, Documenting clinical information in the EMR, Independently interpreting results (not separately reported), Communicating results to the patient/family/caregiver, Counseling and educating the patient/family/caregiver and Care coordination (not separately reported).

## 2022-09-15 NOTE — Progress Notes (Signed)
Physical Therapy Treatment Patient Details Name: Ryan [name] MRN: [id number] DOB: December 09, 1960 Today's Date: 09/15/2022   History of Present Illness Pt is a 62 y/o male admitted secondary to acute back pain and found to have L5-S1 discitis/osteomyelitis, SBO and MSSA bacteremia likely 2/2 line infection. Pt had HD cath replaced with rt femoral temp cath.  PMH including but not limited to hypertension, hyperlipidemia, ESRD.    PT Comments    Pt with limited progress. Continues with pain and weakness. Pt easily distractable. Pt usually lives alone and appears has plans for niece to stay with him at home. I think he may need to be more independent prior to return home. Patient will benefit from intensive inpatient follow up therapy, >3 hours/day    Recommendations for follow up therapy are one component of a multi-disciplinary discharge planning process, led by the attending physician.  Recommendations may be updated based on patient status, additional functional criteria and insurance authorization.  Follow Up Recommendations       Assistance Recommended at Discharge Frequent or constant Supervision/Assistance  Patient can return home with the following A little help with walking and/or transfers;Help with stairs or ramp for entrance;Assist for transportation;Assistance with cooking/housework;A little help with bathing/dressing/bathroom   Equipment Recommendations  Rolling walker (2 wheels)    Recommendations for Other Services Rehab consult     Precautions / Restrictions Precautions Precautions: Fall Restrictions Weight Bearing Restrictions: No     Mobility  Bed Mobility Overal bed mobility: Needs Assistance Bed Mobility: Sidelying to Sit   Sidelying to sit: Mod assist       General bed mobility comments: Assist to elevate trunk into sitting and bring legs off of bed.    Transfers Overall transfer level: Needs assistance Equipment used: Rolling walker (2  wheels) Transfers: Sit to/from Stand, Bed to chair/wheelchair/BSC Sit to Stand: Min assist   Step pivot transfers: Min assist       General transfer comment: Assist to power up and for stability. Verbal cues for hand placement. Bed to [facility] with assist for balance and support    Ambulation/Gait Ambulation/Gait assistance: Min assist Gait Distance (Feet): 5 Feet Assistive device: Rolling walker (2 wheels) Gait Pattern/deviations: Step-through pattern, Decreased stride length, Trunk flexed Gait velocity: decr Gait velocity interpretation: <1.31 ft/sec, indicative of household ambulator   General Gait Details: Assist for balance and support. Tremulous   Stairs             Wheelchair Mobility    Modified Rankin (Stroke Patients Only)       Balance Overall balance assessment: Needs assistance Sitting-balance support: No upper extremity supported, Feet supported Sitting balance-Leahy Scale: Fair     Standing balance support: Bilateral upper extremity supported, During functional activity, Reliant on assistive device for balance Standing balance-Leahy Scale: Poor Standing balance comment: walker and min guard for static standing                            Cognition Arousal/Alertness: Awake/alert Behavior During Therapy: WFL for tasks assessed/performed Overall Cognitive Status: Within Functional Limits for tasks assessed                                 General Comments: Pt easily distracted from main task        Exercises      General Comments General comments (skin integrity, edema, etc.): Pt with  big gasping hiccups throughout      Pertinent Vitals/Pain Pain Assessment Pain Assessment: Faces Faces Pain Scale: Hurts whole lot Pain Location: back Pain Descriptors / Indicators: Aching, Grimacing, Guarding, Moaning Pain Intervention(s): Limited activity within patient's tolerance, Monitored during session, Repositioned    Home  Living                          Prior Function            PT Goals (current goals can now be found in the care plan section) Acute Rehab PT Goals Patient Stated Goal: return to independence Progress towards PT goals: Not progressing toward goals - comment    Frequency    Min 3X/week      PT Plan Discharge plan needs to be updated    Co-evaluation              AM-PAC PT "6 Clicks" Mobility   Outcome Measure  Help needed turning from your back to your side while in a flat bed without using bedrails?: A Little Help needed moving from lying on your back to sitting on the side of a flat bed without using bedrails?: A Lot Help needed moving to and from a bed to a chair (including a wheelchair)?: A Little Help needed standing up from a chair using your arms (e.g., wheelchair or bedside chair)?: A Little Help needed to walk in hospital room?: Total Help needed climbing 3-5 steps with a railing? : Total 6 Click Score: 13    End of Session Equipment Utilized During Treatment: Gait belt Activity Tolerance: Other (comment);Patient limited by pain (distraction) Patient left: in chair;with call bell/phone within reach;with chair alarm set Nurse Communication: Mobility status PT Visit Diagnosis: Other abnormalities of gait and mobility (R26.89);Pain Pain - part of body:  (back)     Time: 4098-1191 PT Time Calculation (min) (ACUTE ONLY): 41 min  Charges:  $Gait Training: 8-22 mins $Therapeutic Activity: 23-37 mins                     [facility] PT Acute Rehabilitation Services Office [phone]    Ryan [name] [facility] 09/15/2022, 6:47 PM

## 2022-09-16 DIAGNOSIS — D631 Anemia in chronic kidney disease: Secondary | ICD-10-CM | POA: Diagnosis not present

## 2022-09-16 DIAGNOSIS — M4647 Discitis, unspecified, lumbosacral region: Secondary | ICD-10-CM | POA: Diagnosis not present

## 2022-09-16 DIAGNOSIS — I120 Hypertensive chronic kidney disease with stage 5 chronic kidney disease or end stage renal disease: Secondary | ICD-10-CM | POA: Diagnosis not present

## 2022-09-16 DIAGNOSIS — R7881 Bacteremia: Secondary | ICD-10-CM | POA: Diagnosis not present

## 2022-09-16 DIAGNOSIS — Z992 Dependence on renal dialysis: Secondary | ICD-10-CM | POA: Diagnosis not present

## 2022-09-16 DIAGNOSIS — N186 End stage renal disease: Secondary | ICD-10-CM | POA: Diagnosis not present

## 2022-09-16 DIAGNOSIS — N250 Renal osteodystrophy: Secondary | ICD-10-CM | POA: Diagnosis not present

## 2022-09-16 DIAGNOSIS — B9561 Methicillin susceptible Staphylococcus aureus infection as the cause of diseases classified elsewhere: Secondary | ICD-10-CM | POA: Diagnosis not present

## 2022-09-16 LAB — RENAL FUNCTION PANEL
Albumin: 1.6 g/dL — ABNORMAL LOW (ref 3.5–5.0)
Anion gap: 20 — ABNORMAL HIGH (ref 5–15)
BUN: 103 mg/dL — ABNORMAL HIGH (ref 8–23)
CO2: 18 mmol/L — ABNORMAL LOW (ref 22–32)
Calcium: 8 mg/dL — ABNORMAL LOW (ref 8.9–10.3)
Chloride: 89 mmol/L — ABNORMAL LOW (ref 98–111)
Creatinine, Ser: 13.26 mg/dL — ABNORMAL HIGH (ref 0.61–1.24)
GFR, Estimated: 4 mL/min — ABNORMAL LOW (ref >60)
Glucose, Bld: 98 mg/dL (ref 70–99)
Phosphorus: 11.7 mg/dL — ABNORMAL HIGH (ref 2.5–4.6)
Potassium: 5.2 mmol/L — ABNORMAL HIGH (ref 3.5–5.1)
Sodium: 127 mmol/L — ABNORMAL LOW (ref 135–145)

## 2022-09-16 LAB — CBC
HCT: 25.7 % — ABNORMAL LOW (ref 39.0–52.0)
Hemoglobin: 8.9 g/dL — ABNORMAL LOW (ref 13.0–17.0)
MCH: 27.6 pg (ref 26.0–34.0)
MCHC: 34.6 g/dL (ref 30.0–36.0)
MCV: 79.8 fL — ABNORMAL LOW (ref 80.0–100.0)
Platelets: 278 10*3/uL (ref 150–400)
RBC: 3.22 MIL/uL — ABNORMAL LOW (ref 4.22–5.81)
RDW: 15.4 % (ref 11.5–15.5)
WBC: 30.4 10*3/uL — ABNORMAL HIGH (ref 4.0–10.5)
nRBC: 0 % (ref 0.0–0.2)

## 2022-09-16 LAB — IRON AND TIBC
Iron: 20 ug/dL — ABNORMAL LOW (ref 45–182)
Saturation Ratios: 16 % — ABNORMAL LOW (ref 17.9–39.5)
TIBC: 129 ug/dL — ABNORMAL LOW (ref 250–450)
UIBC: 109 ug/dL

## 2022-09-16 LAB — FERRITIN: Ferritin: 5276 ng/mL — ABNORMAL HIGH (ref 24–336)

## 2022-09-16 MED ORDER — LIDOCAINE-PRILOCAINE 2.5-2.5 % EX CREA: 1.0000 | TOPICAL_CREAM | CUTANEOUS | Status: DC | PRN

## 2022-09-16 MED ORDER — PENTAFLUOROPROP-TETRAFLUOROETH EX AERO: 1.0000 | INHALATION_SPRAY | CUTANEOUS | Status: DC | PRN

## 2022-09-16 MED ORDER — ALTEPLASE 2 MG IJ SOLR: 2.0000 mg | Freq: Once | INTRAMUSCULAR | Status: DC | PRN

## 2022-09-16 MED ORDER — HEPARIN SODIUM (PORCINE) 1000 UNIT/ML DIALYSIS
3000.0000 [IU] | INTRAMUSCULAR | Status: DC | PRN
  Administered 2022-09-16: 3000 [IU] via INTRAVENOUS_CENTRAL
  Filled 2022-09-16: qty 3

## 2022-09-16 MED ORDER — CEFAZOLIN IV (FOR PTA / DISCHARGE USE ONLY): 2.0000 g | INTRAVENOUS | 0 refills | Status: DC

## 2022-09-16 MED ORDER — LIDOCAINE HCL (PF) 1 % IJ SOLN: 5.0000 mL | INTRAMUSCULAR | Status: DC | PRN

## 2022-09-16 MED ORDER — ANTICOAGULANT SODIUM CITRATE 4% (200MG/5ML) IV SOLN
5.0000 mL | Status: DC | PRN
  Filled 2022-09-16: qty 5

## 2022-09-16 MED ORDER — SODIUM CHLORIDE 0.9 % IV SOLN
12.5000 mg | Freq: Three times a day (TID) | INTRAVENOUS | Status: AC
  Administered 2022-09-16 – 2022-09-17 (×4): 12.5 mg via INTRAVENOUS
  Filled 2022-09-16 (×6): qty 0.5

## 2022-09-16 MED ORDER — HEPARIN SODIUM (PORCINE) 1000 UNIT/ML DIALYSIS: 1000.0000 [IU] | INTRAMUSCULAR | Status: DC | PRN

## 2022-09-16 MED ORDER — CEFAZOLIN IV (FOR PTA / DISCHARGE USE ONLY)
2.0000 g | INTRAVENOUS | 0 refills | Status: DC
Start: 2022-09-16 — End: 2022-11-07

## 2022-09-16 MED ORDER — NEPRO/CARBSTEADY PO LIQD
237.0000 mL | Freq: Two times a day (BID) | ORAL | Status: DC
  Administered 2022-09-16 – 2022-09-25 (×15): 237 mL via ORAL

## 2022-09-16 MED ORDER — SUCROFERRIC OXYHYDROXIDE 500 MG PO CHEW
500.0000 mg | CHEWABLE_TABLET | Freq: Three times a day (TID) | ORAL | Status: DC
  Administered 2022-09-16 – 2022-09-21 (×12): 500 mg via ORAL
  Filled 2022-09-16 (×15): qty 1

## 2022-09-16 MED ORDER — SODIUM ZIRCONIUM CYCLOSILICATE 10 G PO PACK
10.0000 g | PACK | Freq: Once | ORAL | Status: AC
  Administered 2022-09-16: 10 g via ORAL
  Filled 2022-09-16: qty 1

## 2022-09-16 NOTE — Progress Notes (Deleted)
Mobility Specialist Progress Note   09/16/22 1500  Mobility  Activity Transferred from chair to bed   Pt requiring assistance to get back to bed for HD. Pt limited by pain/ lower back spasms throughout transfer. ModA to stand, then +2A to get from EOB to supine. No faults on transfer but pt vocal about pain throughout. Left w/ all needs met and transport in the room.    Frederico Hamman Mobility Specialist Please contact via SecureChat or  Rehab office at [phone]

## 2022-09-16 NOTE — Progress Notes (Addendum)
Informed that temp cathter not functioning in dialysis today. Unable to complete treatment. High access pressure alarms. Will consult IR for eval -->temp cath conversion to [facility] per IR discretion   --Will make NPO at midnight --Give Lokelma 10g today    Tomasa Blase PA-C [facility] 09/16/2022,3:31 PM

## 2022-09-16 NOTE — TOC Progression Note (Signed)
Transition of Care [facility]) - Progression Note    Patient Details  Name: Ryan [name] MRN: [id number] Date of Birth: 12/18/1960  Transition of Care [facility]) CM/SW Contact  Tom-Johnson, Hershal Coria, RN Phone Number: 09/16/2022, 5:42 PM  Clinical Narrative:     Patient's disposition changed from Home Health to CIR.  CM will continue to follow.         Barriers to Discharge: Continued Medical Work up  Expected Discharge Plan and Services   Discharge Planning Services: CM Consult   Living arrangements for the past 2 months: Single Family Home                                       Social Determinants of Health (SDOH) Interventions SDOH Screenings   Food Insecurity: No Food Insecurity (09/11/2022)  Housing: Low Risk  (09/11/2022)  Transportation Needs: No Transportation Needs (09/11/2022)  Utilities: Not At Risk (09/11/2022)  Tobacco Use: Low Risk  (09/12/2022)    Readmission Risk Interventions    09/10/2022    1:53 PM  Readmission Risk Prevention Plan  Transportation Screening Complete  PCP or Specialist Appt within 5-7 Days Complete  Home Care Screening Complete  Medication Review (RN CM) Referral to Pharmacy

## 2022-09-16 NOTE — Progress Notes (Signed)
Initial Nutrition Assessment  DOCUMENTATION CODES:   Not applicable  INTERVENTION:  - Add Nepro Shake po BID, each supplement provides 425 kcal and 19 grams protein  NUTRITION DIAGNOSIS:   Inadequate oral intake related to inability to eat as evidenced by NPO status.  GOAL:   Patient will meet greater than or equal to 90% of their needs  MONITOR:   PO intake, Supplement acceptance  REASON FOR ASSESSMENT:   Malnutrition Screening Tool    ASSESSMENT:   62 y.o. male admits related to low back pain. PMH includes: HTN, HLDm ESRD on HD. Pt is currently receiving medical management related to L5-S1 discitis/osteomyelitis. Pt is also being followed by General Surgery related to SBO.  Meds reviewed:  Rena-vit. Labs reviewed: Na low, K high, BUN/Creatinine elevated, phos elevated.   RD attempted to call pt's room but no answer. Pt has mostly been on and off NPO/CL diet since admission related to SBO. The pt was advanced to a soft diet this am. RD will add Nepro BID for now. Will continue to closely monitor PO tolerance. No significant wt loss per record.   NUTRITION - FOCUSED PHYSICAL EXAM:  Remote assessment.   Diet Order:   Diet Order             DIET SOFT Room service appropriate? Yes; Fluid consistency: Thin; Fluid restriction: 1200 mL Fluid  Diet effective now                   EDUCATION NEEDS:   Not appropriate for education at this time  Skin:  Skin Assessment: Reviewed RN Assessment  Last BM:  6/24  Height:   Ht Readings from Last 1 Encounters:  09/07/22 5\' 9"  (1.753 m)    Weight:   Wt Readings from Last 1 Encounters:  09/12/22 81.2 kg    Ideal Body Weight:     BMI:  Body mass index is 26.44 kg/m.  Estimated Nutritional Needs:   Kcal:  2400-2700 kcals  Protein:  120-135 gm  Fluid:  </= 1.2 L  Bethann Humble, RD, LDN, CNSC.

## 2022-09-16 NOTE — Progress Notes (Signed)
PROGRESS NOTE    GIANMARCO ROYE  MWU:[id number] DOB: [birth year] DOA: 09/07/2022 PCP: Sharin Grave, MD   Brief Narrative: Ryan [name] is a 62 y.o. male with a history of hypertension, hyperlipidemia, ESRD on hemodialysis.  Patient presented secondary to low back pain and was found to have evidence of L5-S1 discitis/osteomyelitis in addition to MSSA bacteremia.  ID consulted.  Workup significant for possible endocarditis. Patient also found to have a small bowel obstruction.   Assessment/Plan:  L5-S1 discitis/osteomyelitis MSSA bacteremia Confirmed possible early discitis/osteomyelitis on MRI imaging.  Blood cultures positive for MSSA.  Patient was empirically treated with vancomycin and cefepime.  Infectious disease consulted.  Patient transition to cefazolin IV with hemodialysis.  Transthoracic echocardiogram significant for a large, rounded structure on the anterior leaflet of the mitral valve concerning for endocarditis.  ID recommending transesophageal echocardiogram; cardiology consulted. Transesophageal Echocardiogram deferred secondary to risk/benefit profile. 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 Leukocytosis slightly worsened but stabilized again. -Infectious disease recommendations: Cefazolin IV with hemodialysis with plan for 6 weeks of treatment from 6/18; possible repeat Transthoracic Echocardiogram, continued recommendations pending  ESRD on hemodialysis Nephrology consulted.  Patient presented with a tunneled dialysis catheter which was removed on 6/18 secondary to bacteremia.  Per ID, patient requires a 72-hour line holiday.  Patient last received hemodialysis on 6/18. Interventional radiology consulted and placed non-tunneled HD cath on 6/21. Hemodialysis resumed. -Nephrology recommendations: pending today -IR recommendations (6/21): recommend coordinating with the IR service for sterioid prep prior  to attempted left internal jugular approach HD catheter placement versus conversion of right CFV temp catheter to tunneled HD catheter.   Intractable hiccups Patient started on Thorazine as needed. Possibly related to uremia. Recurrent.  Not improving with therapies. -Switch to scheduled Thorazine 12.5 mg TID x2 days and reevaluate need  Small bowel obstruction Patient without a bowel movement for several days initially.  Diagnosis confirmed on CT scan with transition point located in the right anterior abdomen.  General surgery consulted. Patient underwent SBO protocol and gastrografin made it to his colon suggesting at most a partial small bowel obstruction at that time, although per general surgery, possible ileus related to possible gastroenteritis. Symptoms appear to be resolved. -General surgery recommendations: Advance diet as tolerated  Primary hypertension Patient with mild hypotension this morning.  Atenolol held.  Per nephrology, patient is under dry weight which likely is contributing to lower blood pressures. Improved with small NS IV fluid bolus.  Elevated troponin Previously noted. No associated chest pain. Presumed secondary to demand ischemia.  Lung nodules Noted on initial CT scan.  Initial recommendation for repeat CT scan of the chest in 18 to 24 months.  Repeat CT scan on 6/20 showed multiple new pulmonary nodules compatible with likely active infection and/or aspiration.  Patient without symptoms to suggest pneumonia although it is likely these nodules are septic in etiology.  Bladder wall thickening Noted incidentally on CT imaging.  No urinary symptoms and patient does not make urine secondary to ESRD. Unclear if this correlates with isolated infection or is related to overall infectious picture.   DVT prophylaxis: SCDs Code Status:   Code Status: Full Code Family Communication: None at bedside Disposition Plan: Discharge home pending completion of endocarditis  workup/management/ID recommendations, and outpatient antibiotic regimen and hemodialysis recommendations   Consultants:  Infectious disease Nephrology Interventional radiology Cardiothoracic surgery  Procedures:  Hemodialysis 6/18: Successful removal of tunneled hemodialysis catheter  6/21: Successful placement of a non-tunneled  HD catheter via the right CFV with tips terminating within the inferior aspect of the IVC   Antimicrobials: Vancomycin IV Flagyl IV Cefepime IV Cefazolin IV   Subjective: No specific concerns from overnight. Continued hiccups.  Objective: BP 110/68   Pulse 68   Temp 97.8 F (36.6 C)   Resp 18   Ht 5\' 9"  (1.753 m)   Wt 81.2 kg   SpO2 96%   BMI 26.44 kg/m   Examination:  General exam: Appears calm and comfortable Respiratory system: Clear to auscultation. Respiratory effort normal. Cardiovascular system: S1 & S2 heard, RRR. Gastrointestinal system: Abdomen is nondistended, soft and nontender. Normal bowel sounds heard. Central nervous system: Alert and oriented.  Musculoskeletal: No edema. No calf tenderness Psychiatry: Judgement and insight appear normal. Mood & affect appropriate.   Data Reviewed: I have personally reviewed following labs and imaging studies   Last CBC Lab Results  Component Value Date   WBC 30.4 (H) 09/16/2022   HGB 8.9 (L) 09/16/2022   HCT 25.7 (L) 09/16/2022   MCV 79.8 (L) 09/16/2022   MCH 27.6 09/16/2022   RDW 15.4 09/16/2022   PLT 278 09/16/2022     Last metabolic panel Lab Results  Component Value Date   GLUCOSE 98 09/16/2022   NA 127 (L) 09/16/2022   K 5.2 (H) 09/16/2022   CL 89 (L) 09/16/2022   CO2 18 (L) 09/16/2022   BUN 103 (H) 09/16/2022   CREATININE 13.26 (H) 09/16/2022   GFRNONAA 4 (L) 09/16/2022   CALCIUM 8.0 (L) 09/16/2022   PHOS 11.7 (H) 09/16/2022   PROT 8.0 09/08/2022   ALBUMIN 1.6 (L) 09/16/2022   BILITOT 0.9 09/08/2022   ALKPHOS 106 09/08/2022   AST 82 (H) 09/08/2022   ALT 58  (H) 09/08/2022   ANIONGAP 20 (H) 09/16/2022     Creatinine Clearance: Estimated Creatinine Clearance: 5.9 mL/min (A) (by C-G formula based on SCr of 13.26 mg/dL (H)).  Recent Results (from the past 240 hour(s))  Blood culture (routine x 2)     Status: Abnormal   Collection Time: 09/07/22  2:15 AM   Specimen: BLOOD  Result Value Ref Range Status   Specimen Description   Final    BLOOD LEFT ANTECUBITAL Performed at [facility], [address] [place], [place] [address]    Special Requests   Final    BOTTLES DRAWN AEROBIC AND ANAEROBIC Blood Culture adequate volume Performed at [facility], [address], [place], [place] [address]    Culture  Setup Time   Final    GRAM POSITIVE COCCI IN BOTH AEROBIC AND ANAEROBIC BOTTLES CRITICAL VALUE NOTED.  VALUE IS CONSISTENT WITH PREVIOUSLY REPORTED AND CALLED VALUE.    Culture (A)  Final    STAPHYLOCOCCUS AUREUS SUSCEPTIBILITIES PERFORMED ON PREVIOUS CULTURE WITHIN THE LAST 5 DAYS. Performed at [facility], [address], [place], [place] [address]    Report Status 09/09/2022 FINAL  Final  Blood culture (routine x 2)     Status: Abnormal   Collection Time: 09/07/22  2:51 AM   Specimen: BLOOD  Result Value Ref Range Status   Specimen Description   Final    BLOOD BLOOD RIGHT HAND Performed at [facility], [address] [place], [place] [address]    Special Requests   Final    BOTTLES DRAWN AEROBIC AND ANAEROBIC Blood Culture adequate volume Performed at [facility], [address], [place], [place] [address]  Culture  Setup Time   Final    GRAM POSITIVE COCCI IN BOTH AEROBIC AND ANAEROBIC BOTTLES CRITICAL RESULT CALLED TO, READ BACK BY AND VERIFIED WITH: PHARMD NICK GLOGOVAC [id number] 1503 BY EC Performed at [facility], [address] [place], [place] [address]    Culture STAPHYLOCOCCUS AUREUS (A)  Final   Report Status 09/09/2022  FINAL  Final   Organism ID, Bacteria STAPHYLOCOCCUS AUREUS  Final      Susceptibility   Staphylococcus aureus - MIC*    CIPROFLOXACIN <=0.5 SENSITIVE Sensitive     ERYTHROMYCIN <=0.25 SENSITIVE Sensitive     GENTAMICIN <=0.5 SENSITIVE Sensitive     OXACILLIN 0.5 SENSITIVE Sensitive     TETRACYCLINE <=1 SENSITIVE Sensitive     VANCOMYCIN 1 SENSITIVE Sensitive     TRIMETH/SULFA <=10 SENSITIVE Sensitive     CLINDAMYCIN <=0.25 SENSITIVE Sensitive     RIFAMPIN <=0.5 SENSITIVE Sensitive     Inducible Clindamycin NEGATIVE Sensitive     LINEZOLID 2 SENSITIVE Sensitive     * STAPHYLOCOCCUS AUREUS  Blood Culture ID Panel (Reflexed)     Status: Abnormal   Collection Time: 09/07/22  2:51 AM  Result Value Ref Range Status   Enterococcus faecalis NOT DETECTED NOT DETECTED Final   Enterococcus Faecium NOT DETECTED NOT DETECTED Final   Listeria monocytogenes NOT DETECTED NOT DETECTED Final   Staphylococcus species DETECTED (A) NOT DETECTED Final    Comment: CRITICAL RESULT CALLED TO, READ BACK BY AND VERIFIED WITH: PHARMD NICK GLOGOVAC [id number] AT 1503 BY EC    Staphylococcus aureus (BCID) DETECTED (A) NOT DETECTED Final    Comment: CRITICAL RESULT CALLED TO, READ BACK BY AND VERIFIED WITH: PHARMD NICK GLOGOVAC [id number] AT 1503 BY EC    Staphylococcus epidermidis NOT DETECTED NOT DETECTED Final   Staphylococcus lugdunensis NOT DETECTED NOT DETECTED Final   Streptococcus species NOT DETECTED NOT DETECTED Final   Streptococcus agalactiae NOT DETECTED NOT DETECTED Final   Streptococcus pneumoniae NOT DETECTED NOT DETECTED Final   Streptococcus pyogenes NOT DETECTED NOT DETECTED Final   A.calcoaceticus-baumannii NOT DETECTED NOT DETECTED Final   Bacteroides fragilis NOT DETECTED NOT DETECTED Final   Enterobacterales NOT DETECTED NOT DETECTED Final   Enterobacter cloacae complex NOT DETECTED NOT DETECTED Final   Escherichia coli NOT DETECTED NOT DETECTED Final   Klebsiella aerogenes NOT DETECTED  NOT DETECTED Final   Klebsiella oxytoca NOT DETECTED NOT DETECTED Final   Klebsiella pneumoniae NOT DETECTED NOT DETECTED Final   Proteus species NOT DETECTED NOT DETECTED Final   Salmonella species NOT DETECTED NOT DETECTED Final   Serratia marcescens NOT DETECTED NOT DETECTED Final   Haemophilus influenzae NOT DETECTED NOT DETECTED Final   Neisseria meningitidis NOT DETECTED NOT DETECTED Final   Pseudomonas aeruginosa NOT DETECTED NOT DETECTED Final   Stenotrophomonas maltophilia NOT DETECTED NOT DETECTED Final   Candida albicans NOT DETECTED NOT DETECTED Final   Candida auris NOT DETECTED NOT DETECTED Final   Candida glabrata NOT DETECTED NOT DETECTED Final   Candida krusei NOT DETECTED NOT DETECTED Final   Candida parapsilosis NOT DETECTED NOT DETECTED Final   Candida tropicalis NOT DETECTED NOT DETECTED Final   Cryptococcus neoformans/gattii NOT DETECTED NOT DETECTED Final   Meth resistant mecA/C and MREJ NOT DETECTED NOT DETECTED Final    Comment: Performed at [facility], [address], [place], [place] [address]  Respiratory (~20 pathogens) panel by PCR     Status: None   Collection Time: 09/07/22  5:45 AM   Specimen: Anterior Nasal Swab; Respiratory  Result Value Ref Range Status   Adenovirus NOT DETECTED NOT DETECTED Final   Coronavirus 229E NOT DETECTED NOT DETECTED Final    Comment: (NOTE) The Coronavirus on the Respiratory Panel, DOES NOT test for the novel  Coronavirus (2019 nCoV)    Coronavirus HKU1 NOT DETECTED NOT DETECTED Final   Coronavirus NL63 NOT DETECTED NOT DETECTED Final   Coronavirus OC43 NOT DETECTED NOT DETECTED Final   Metapneumovirus NOT DETECTED NOT DETECTED Final   Rhinovirus / Enterovirus NOT DETECTED NOT DETECTED Final   Influenza A NOT DETECTED NOT DETECTED Final   Influenza B NOT DETECTED NOT DETECTED Final   Parainfluenza Virus 1 NOT DETECTED NOT DETECTED Final   Parainfluenza Virus 2 NOT DETECTED NOT DETECTED Final   Parainfluenza  Virus 3 NOT DETECTED NOT DETECTED Final   Parainfluenza Virus 4 NOT DETECTED NOT DETECTED Final   Respiratory Syncytial Virus NOT DETECTED NOT DETECTED Final   Bordetella pertussis NOT DETECTED NOT DETECTED Final   Bordetella Parapertussis NOT DETECTED NOT DETECTED Final   Chlamydophila pneumoniae NOT DETECTED NOT DETECTED Final   Mycoplasma pneumoniae NOT DETECTED NOT DETECTED Final    Comment: Performed at [facility], [address], [place], [place] [address]  SARS Coronavirus 2 by RT PCR (hospital order, performed in [facility] hospital lab) *cepheid single result test* Anterior Nasal Swab     Status: None   Collection Time: 09/07/22  5:45 AM   Specimen: Anterior Nasal Swab  Result Value Ref Range Status   SARS Coronavirus 2 by RT PCR NEGATIVE NEGATIVE Final    Comment: (NOTE) SARS-CoV-2 target nucleic acids are NOT DETECTED.  The SARS-CoV-2 RNA is generally detectable in upper and lower respiratory specimens during the acute phase of infection. The lowest concentration of SARS-CoV-2 viral copies this assay can detect is 250 copies / mL. A negative result does not preclude SARS-CoV-2 infection and should not be used as the sole basis for treatment or other patient management decisions.  A negative result may occur with improper specimen collection / handling, submission of specimen other than nasopharyngeal swab, presence of viral mutation(s) within the areas targeted by this assay, and inadequate number of viral copies (<250 copies / mL). A negative result must be combined with clinical observations, patient history, and epidemiological information.  Fact Sheet for Patients:   RoadLapTop.co.za  Fact Sheet for Healthcare Providers: http://kim-miller.com/  This test is not yet approved or  cleared by the [place] FDA and has been authorized for detection and/or diagnosis of SARS-CoV-2 by FDA under an Emergency Use  Authorization (EUA).  This EUA will remain in effect (meaning this test can be used) for the duration of the COVID-19 declaration under Section 564(b)(1) of the Act, 21 U.S.C. section 360bbb-3(b)(1), unless the authorization is terminated or revoked sooner.  Performed at [facility], [address], [place], [place] [address]   Culture, blood (Routine X 2) w Reflex to ID Panel     Status: None   Collection Time: 09/08/22 11:28 AM   Specimen: BLOOD LEFT HAND  Result Value Ref Range Status   Specimen Description BLOOD LEFT HAND  Final   Special Requests   Final    BOTTLES DRAWN AEROBIC AND ANAEROBIC Blood Culture adequate volume   Culture   Final    NO GROWTH 5 DAYS Performed at [facility], [address] [place], [place] [address]  Report Status 09/13/2022 FINAL  Final  Culture, blood (Routine X 2) w Reflex to ID Panel     Status: None   Collection Time: 09/08/22 11:28 AM   Specimen: BLOOD RIGHT HAND  Result Value Ref Range Status   Specimen Description BLOOD RIGHT HAND  Final   Special Requests   Final    BOTTLES DRAWN AEROBIC AND ANAEROBIC Blood Culture adequate volume   Culture   Final    NO GROWTH 5 DAYS Performed at [facility], [address], [place], [place] [address]    Report Status 09/13/2022 FINAL  Final  Culture, blood (Routine X 2) w Reflex to ID Panel     Status: None   Collection Time: 09/10/22  3:16 PM   Specimen: BLOOD  Result Value Ref Range Status   Specimen Description BLOOD RIGHT ANTECUBITAL  Final   Special Requests   Final    BOTTLES DRAWN AEROBIC AND ANAEROBIC Blood Culture adequate volume   Culture   Final    NO GROWTH 5 DAYS Performed at [facility], [address], [place], [place] [address]    Report Status 09/15/2022 FINAL  Final  Culture, blood (Routine X 2) w Reflex to ID Panel     Status: None   Collection Time: 09/10/22  3:16 PM   Specimen: BLOOD LEFT ARM  Result Value Ref Range Status    Specimen Description BLOOD LEFT ARM  Final   Special Requests   Final    BOTTLES DRAWN AEROBIC AND ANAEROBIC Blood Culture adequate volume   Culture   Final    NO GROWTH 5 DAYS Performed at [facility], [address], [place], [place] [address]    Report Status 09/15/2022 FINAL  Final  MRSA Next Gen by PCR, Nasal     Status: None   Collection Time: 09/11/22  9:11 AM   Specimen: Nasal Mucosa; Nasal Swab  Result Value Ref Range Status   MRSA by PCR Next Gen NOT DETECTED NOT DETECTED Final    Comment: (NOTE) The GeneXpert MRSA Assay (FDA approved for NASAL specimens only), is one component of a comprehensive MRSA colonization surveillance program. It is not intended to diagnose MRSA infection nor to guide or monitor treatment for MRSA infections. Test performance is not FDA approved in patients less than 61 years old. Performed at [facility], [address], [place], [place] [address]       Radiology Studies: No results found.    LOS: 9 days    Jacquelin Hawking, MD [facility] 09/16/2022, 10:54 AM   If 7PM-7AM, please contact night-coverage [email or web address]

## 2022-09-16 NOTE — Progress Notes (Addendum)
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  MRI showed possible L5-S1 early osteomyelitis. - Patient was febrile with leukocytosis 22 K on arrival.  Respiratory viral panel negative.  He had no respiratory complaints this a.m.  Found to have MSSA bacteremia as well. - No wounds noted.  He does have a right IJ which she notes he has had less than a year.  I suspect this is the source of [facility] removed on 6/18. Non tunneled HD cath R femoral  placed on 6/21 - TTE showed large rounded structure on mitral valve. Concern for endocarditis. - TEE was planned but given given notable lab work BUN 105 was canceled. - CTS engaged and no plans for surgical intervention, recommend antibiotics.  Recommendations:  -Continue cefazolin, anticipate 8 weeks antibiotics with HD from line removal on 6/18 EOT 11/04/2022.  Of note repeat blood culture after PICC line put on 6/19 with no growth as well. -Communicated with cardiology Dr. Rennis Golden.  No plans for TEE as diagnosis was made, as visualized and surgery not indicated.  Noted that Dr. Almyra Free) recommended follow-up TTE.  Not in 2-3 months per Dr. Rennis Golden  -Follow-up with either CTS or cardiology for repeat TTE outpatient -Follow-up with infectious disease on 8/6 with myself.   #Hiccups - Patient is  on Thorazine  #SBO -Patient had not had a bowel movement for several days - CT scan showed concern for SBO with bowl wall thickening unclear etiolgy.  General surgery engaged, patient underwent SBO protocol noted partial SBO at the time.  -Doing well, having BM. Diet advanced  ID will sign off Microbiology:   Antibiotics: Vancomycin6/15 Cefepime 6/15 Metro 6/15 Cefazolin 6/16- Cultures: Blood 6/16 2/2 MSSA 6/17 p no growth  SUBJECTIVE: Reports he is doing well.  No new complaints. Interval: Afebrile overnight. Wbc 30 K.  Review of Systems: Review of Systems  All other systems reviewed and are negative.    Scheduled Meds:  (feeding supplement) PROSource Plus  30 mL Oral BID BM   atenolol  50 mg Oral Daily   calcitRIOL  0.5 mcg Oral Q T,Th,Sat-1800   Chlorhexidine Gluconate Cloth  6 each Topical Daily   cinacalcet  30 mg Oral Q supper   feeding supplement (NEPRO CARB STEADY)  237 mL Oral BID BM   multivitamin  1 tablet Oral QHS   pantoprazole  40 mg Oral Daily   sodium chloride flush  3 mL Intravenous Q12H   sucroferric oxyhydroxide  500 mg Oral TID WC   Continuous Infusions:  anticoagulant sodium citrate      ceFAZolin (ANCEF) IV     chlorproMAZINE (THORAZINE) 12.5 mg in sodium chloride 0.9 % 25 mL IVPB     methocarbamol (ROBAXIN) IV 500 mg (09/15/22 1854)  PRN Meds:.acetaminophen **OR** acetaminophen, alteplase, anticoagulant sodium citrate, guaiFENesin, heparin, heparin, HYDROmorphone (DILAUDID) injection, lidocaine (PF), lidocaine-prilocaine, lip balm, methocarbamol (ROBAXIN) IV, ondansetron **OR** ondansetron (ZOFRAN) IV, oxyCODONE, pentafluoroprop-tetrafluoroeth, senna Allergies  Allergen Reactions   Azithromycin Nausea And Vomiting and Other (See Comments)    Chest tightness  Other Reaction(s): GI Intolerance   Iodinated Contrast Media Itching and Nausea Only    Probably needs prednisone prep prior to contrast    OBJECTIVE: Vitals:   09/15/22 1840  09/15/22 2145 09/16/22 0615 09/16/22 0857  BP: 98/61 (!) 92/51 (!) 101/59 110/68  Pulse: 87 80 66 68  Resp: 20 20 18    Temp: 98.1 F (36.7 C) 98.2 F (36.8 C) (!) 97.5 F (36.4 C) 97.8 F (36.6 C)  TempSrc: Oral Oral Oral   SpO2: 94% 92% 99% 96%  Weight:      Height:       Body mass index is 26.44 kg/m.  Physical Exam Constitutional:      General: He is not in acute distress.    Appearance: He is normal weight. He is not toxic-appearing.  HENT:     Head: Normocephalic and atraumatic.     Right Ear: External ear normal.     Left Ear: External ear normal.     Nose: No congestion or rhinorrhea.     Mouth/Throat:     Mouth: Mucous membranes are moist.     Pharynx: Oropharynx is clear.  Eyes:     Extraocular Movements: Extraocular movements intact.     Conjunctiva/sclera: Conjunctivae normal.     Pupils: Pupils are equal, round, and reactive to light.  Cardiovascular:     Rate and Rhythm: Normal rate and regular rhythm.     Heart sounds: No murmur heard.    No friction rub. No gallop.  Pulmonary:     Effort: Pulmonary effort is normal.     Breath sounds: Normal breath sounds.  Abdominal:     General: Abdomen is flat. Bowel sounds are normal.     Palpations: Abdomen is soft.  Musculoskeletal:        General: No swelling. Normal range of motion.     Cervical back: Normal range of motion and neck supple.  Skin:    General: Skin is warm and dry.  Neurological:     General: No focal deficit present.     Mental Status: He is oriented to person, place, and time.  Psychiatric:        Mood and Affect: Mood normal.       Lab Results Lab Results  Component Value Date   WBC 30.4 (H) 09/16/2022   HGB 8.9 (L) 09/16/2022   HCT 25.7 (L) 09/16/2022   MCV 79.8 (L) 09/16/2022   PLT 278 09/16/2022    Lab Results  Component Value Date   CREATININE 13.26 (H) 09/16/2022   BUN 103 (H) 09/16/2022   NA 127 (L) 09/16/2022   K 5.2 (H) 09/16/2022   CL 89 (L) 09/16/2022   CO2  18 (L) 09/16/2022    Lab Results  Component Value Date   ALT 58 (H) 09/08/2022   AST 82 (H) 09/08/2022   ALKPHOS 106 09/08/2022   BILITOT 0.9 09/08/2022        Danelle Earthly, MD [facility] 09/16/2022, 2:48 PM  I have personally spent 55 minutes involved in face-to-face and non-face-to-face activities for this patient on the day of the visit. Professional time spent includes the following  activities: Preparing to see the patient (review of tests), Obtaining and/or reviewing separately obtained history (admission/discharge record), Performing a medically appropriate examination and/or evaluation , Ordering medications/tests/procedures, referring and communicating with other health care professionals, Documenting clinical information in the EMR, Independently interpreting results (not separately reported), Communicating results to the patient/family/caregiver, Counseling and educating the patient/family/caregiver and Care coordination (not separately reported).

## 2022-09-16 NOTE — Progress Notes (Signed)
Inpatient Rehab Admissions Coordinator Note:   Per updated therapy recommendations patient was screened for CIR candidacy by Stephania Fragmin, PT. At this time, pt appears to be a potential candidate for CIR. I will place an order for rehab consult for full assessment, per our protocol.  Please contact me any with questions.Estill Dooms, PT, DPT [phone] 09/16/22 9:11 AM

## 2022-09-16 NOTE — Progress Notes (Signed)
   09/16/22 1550  Vitals  Temp 97.7 F (36.5 C)  Pulse Rate 77  Resp 18  BP 117/67  SpO2 91 %  O2 Device Room Air  Type of Weight Post-Dialysis  Oxygen Therapy  Pulse Oximetry Type Continuous  Oximetry Probe Site Changed No  Patient Activity (if Appropriate) In bed  Post Treatment  Dialyzer Clearance Lightly streaked  Duration of HD Treatment -hour(s) 0 hour(s)  Hemodialysis Intake (mL) 0 mL  Liters Processed 0  Fluid Removed (mL) 0 mL  Tolerated HD Treatment No (Comment)  Post-Hemodialysis Comments see progress notes   Received patient in bed to unit.  Alert and oriented.  Informed consent signed and in chart.   TX duration:0  Patient tolerated well.  Transported back to the room  Alert, without acute distress.  Hand-off given to patient's nurse.   Access used: R femoral DLC Access issues: absolutely no flow witheither port lines reversed---PA present to view--tx discontinued and she called IR to set up another line placement  Total UF removed: 0 Medication(s) given: none    [facility]

## 2022-09-16 NOTE — Progress Notes (Signed)
5 Days Post-Op   Subjective/Chief Complaint: PT doing well this AM Tol fulls  +BMs   Objective: Vital signs in last 24 hours: Temp:  [97.5 F (36.4 C)-98.7 F (37.1 C)] 97.8 F (36.6 C) (06/25 0857) Pulse Rate:  [66-87] 68 (06/25 0857) Resp:  [18-20] 18 (06/25 0615) BP: (92-110)/(51-68) 110/68 (06/25 0857) SpO2:  [90 %-99 %] 96 % (06/25 0857) Last BM Date : 09/15/22  Intake/Output from previous day: 06/24 0701 - 06/25 0700 In: 890 [P.O.:840; IV Piggyback:50] Out: 0  Intake/Output this shift: No intake/output data recorded.  PE: Constitutional: No acute distress GI: Soft, NT, ND, +BS    Lab Results:  Recent Labs    09/14/22 1139 09/16/22 0338  WBC 31.4* 30.4*  HGB 9.7* 8.9*  HCT 28.5* 25.7*  PLT 267 278   BMET Recent Labs    09/15/22 0127 09/16/22 0338  NA 129* 127*  K 4.7 5.2*  CL 91* 89*  CO2 19* 18*  GLUCOSE 110* 98  BUN 86* 103*  CREATININE 11.52* 13.26*  CALCIUM 7.8* 8.0*   PT/INR No results for input(s): "LABPROT", "INR" in the last 72 hours. ABG No results for input(s): "PHART", "HCO3" in the last 72 hours.  Invalid input(s): "PCO2", "PO2"  Studies/Results: No results found.  Anti-infectives: Anti-infectives (From admission, onward)    Start     Dose/Rate Route Frequency Ordered Stop   09/16/22 1800  ceFAZolin (ANCEF) IVPB 2g/100 mL premix        2 g 200 mL/hr over 30 Minutes Intravenous Every T-Th-Sa (1800) 09/15/22 1530     09/11/22 1800  ceFAZolin (ANCEF) IVPB 1 g/50 mL premix        1 g 100 mL/hr over 30 Minutes Intravenous Daily-1800 09/11/22 0848 09/15/22 2359   09/09/22 1200  ceFAZolin (ANCEF) IVPB 2g/100 mL premix  Status:  Discontinued        2 g 200 mL/hr over 30 Minutes Intravenous Every T-Th-Sa (Hemodialysis) 09/07/22 1840 09/11/22 0848   09/08/22 0300  ceFEPIme (MAXIPIME) 1 g in sodium chloride 0.9 % 100 mL IVPB  Status:  Discontinued        1 g 200 mL/hr over 30 Minutes Intravenous Every 24 hours 09/07/22 0539  09/07/22 0647   09/07/22 1530  ceFAZolin (ANCEF) IVPB 2g/100 mL premix        2 g 200 mL/hr over 30 Minutes Intravenous  Once 09/07/22 1520 09/07/22 1800   09/07/22 0645  vancomycin (VANCOREADY) IVPB 750 mg/150 mL        750 mg 150 mL/hr over 60 Minutes Intravenous  Once 09/07/22 0639 09/07/22 0909   09/07/22 0538  vancomycin variable dose per unstable renal function (pharmacist dosing)  Status:  Discontinued         Does not apply See admin instructions 09/07/22 0539 09/07/22 0647   09/07/22 0530  metroNIDAZOLE (FLAGYL) IVPB 500 mg  Status:  Discontinued        500 mg 100 mL/hr over 60 Minutes Intravenous Every 12 hours 09/07/22 0515 09/07/22 1551   09/07/22 0200  vancomycin (VANCOCIN) IVPB 1000 mg/200 mL premix        1,000 mg 200 mL/hr over 60 Minutes Intravenous  Once 09/07/22 0155 09/07/22 0622   09/07/22 0200  ceFEPIme (MAXIPIME) 2 g in sodium chloride 0.9 % 100 mL IVPB        2 g 200 mL/hr over 30 Minutes Intravenous  Once 09/07/22 0155 09/07/22 0350       Assessment/Plan: HD#10 -  SBO - CT AP with SBO with transition in R anterior abdomen in area of mild bowel wall thickening - ?infections vs inflammatory enteritis vs neoplastic infiltration - tol full liquid diet, adv to soft diet -no needs for surgical intervention at this point.  We will sign off as he will still remain in the hospital for other medical needs.  We are available as needed.   FEN: soft VTE: SCDs ID: [id number] for bacteremia    - per TRH -  L5-S1 discitis/osteomyelitis  MSSA bacteremia  ESRD on HD HTN Lung nodules     LOS: 9 days    Ryan [name] 09/16/2022

## 2022-09-16 NOTE — Progress Notes (Signed)
Mobility Specialist Progress Note   09/16/22 1500  Mobility  Activity Transferred from chair to bed  Level of Assistance +2 (takes two people)  Press photographer wheel walker  Distance Ambulated (ft) 2 ft  Activity Response Tolerated well  Mobility Referral Yes  $Mobility charge 1 Mobility  Mobility Specialist Start Time (ACUTE ONLY) 1430  Mobility Specialist Stop Time (ACUTE ONLY) 1510  Mobility Specialist Time Calculation (min) (ACUTE ONLY) 40 min   Pt requiring assistance to get back to bed for HD. Pt limited by pain/ lower back spasms throughout transfer. ModA to stand, then +2A to get from EOB to supine. No faults on transfer but pt vocal about pain throughout. Left w/ all needs met and transport in the room.  Frederico Hamman Mobility Specialist Please contact via SecureChat or  Rehab office at [phone]

## 2022-09-16 NOTE — Consult Note (Signed)
Physical Medicine and Rehabilitation Consult Reason for Consult:CIR/ discitis L5/S1 Referring Physician: Dr Jacquelin Hawking   HPI: Ryan [name] is a 62 y.o.  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  He also was found to have SBO- infectious vs inflammatory- with a current WBC of 30.4; is on Ancef for bacteremia/infection.  His Na is 127 currently and also having intractable hiccups since admission per pt.   He notes his pain is 12/10 and causing his to catch his breath, he hurts so bad with supine to sit and sit to stand- but calms down when staying in same position.   LBM 2 days ago- had poor appetite, so doesn't feel constipated- his breakfast tray was untouched in room and lunch tray delivered while I was there. He notes "not hungry".   He has documented bowel incontinence in chart- having smears- and no documented urine- sounds anuric.    Review of Systems  Constitutional:  Positive for chills, malaise/fatigue and weight loss. Negative for fever.  HENT: Negative.    Eyes: Negative.   Respiratory:  Negative for cough, sputum production and shortness of breath.   Cardiovascular:  Negative for chest pain, palpitations and PND.  Gastrointestinal:  Positive for nausea.       Poor appetite  Genitourinary:        Anuric  Musculoskeletal:  Positive for back pain, joint pain and myalgias. Negative for neck pain.  Skin: Negative.   Neurological:  Positive for weakness. Negative for sensory change.  Endo/Heme/Allergies: Negative.   Psychiatric/Behavioral:  Negative for hallucinations, substance abuse and suicidal ideas. The patient is not nervous/anxious.   All other systems reviewed and are negative.  Past Medical  History:  Diagnosis Date   Allergy    Anemia    Blood transfusion without reported diagnosis    Dialysis patient (HCC)    Tues,thurs,sat   ESRD (end stage renal disease) (HCC)    TTHS-    Family history of adverse reaction to anesthesia    father had allergic reaction with anesthesia with a dental procedure-(pt. doesn't know)   Hyperlipidemia    Hypertension    Past Surgical History:  Procedure Laterality Date   A/V FISTULAGRAM Left 12/15/2016   Procedure: A/V Fistulagram - left;  Surgeon: Chuck Hint, MD;  Location: [facility] INVASIVE CV LAB;  Service: Cardiovascular;  Laterality: Left;   AV FISTULA PLACEMENT Left 08/28/2016   Procedure: LEFT UPPER  ARM ARTERIOVENOUS (AV) FISTULA CREATION;  Surgeon: Chuck Hint, MD;  Location: [facility] OR;  Service: Vascular;  Laterality: Left;   DIALYSIS/PERMA CATHETER INSERTION Right 12/21/2017   Procedure: INSERTION OF DIALYSIS CATHETER Right Internal Jugular .;  Surgeon: Sherren Kerns, MD;  Location: [facility] OR;  Service: Vascular;  Laterality: Right;   FISTULA SUPERFICIALIZATION Left 09/23/2017   Procedure: FISTULA PLICATION LEFT ARM;  Surgeon: Sherren Kerns, MD;  Location: [facility] OR;  Service: Vascular;  Laterality: Left;   FISTULOGRAM Left 12/21/2017   Procedure: FISTULOGRAM with Balloon Angioplasty.;  Surgeon: Sherren Kerns, MD;  Location: [facility] OR;  Service: Vascular;  Laterality: Left;   HEMATOMA EVACUATION Left 08/27/2020   Procedure: EVACUATION HEMATOMA LEFT ARM;  Surgeon: Sherren Kerns, MD;  Location: [facility]  OR;  Service: Vascular;  Laterality: Left;   IR FLUORO GUIDE CV LINE RIGHT  09/12/2022   IR REMOVAL TUN CV CATH W/O FL  09/09/2022   IR THORACENTESIS ASP PLEURAL SPACE W/IMG GUIDE  08/22/2017   1.2 L -right-sided   IR THORACENTESIS ASP PLEURAL SPACE W/IMG GUIDE  10/19/2017   IR US GUIDE VASC ACCESS RIGHT  09/12/2022   KIDNEY TRANSPLANT     REVISON OF ARTERIOVENOUS FISTULA Left 12/21/2017   Procedure: PLICATION and Ligation  of F LEFT ARM ARTERIOVENOUS FISTULA;  Surgeon: Sherren Kerns, MD;  Location: [facility] OR;  Service: Vascular;  Laterality: Left;   REVISON OF ARTERIOVENOUS FISTULA Left 07/16/2020   Procedure: EXCISION OF LEFT ARM ARTERIOVENOUS FISTULA;  Surgeon: Sherren Kerns, MD;  Location: [facility] OR;  Service: Vascular;  Laterality: Left;   stent in kidneys     dec 2017   TRANSTHORACIC ECHOCARDIOGRAM  09/22/2017    Severe LVH.  Normal function -EF 55-60%.  GRII DD.  Moderate RV dilation with mildly reduced RV function.   Fixed right coronary cusp with very mild aortic stenosis.  The myocardium has a speckled appearance --> .   Recommend cardiac MRI to evaluate for amyloid   UPPER EXTREMITY VENOGRAPHY Bilateral 06/01/2020   Procedure: UPPER EXTREMITY VENOGRAPHY;  Surgeon: Chuck Hint, MD;  Location: [facility] INVASIVE CV LAB;  Service: Cardiovascular;  Laterality: Bilateral;   Family History  Problem Relation Age of Onset   Hypertension Mother    Heart disease Mother 67       By his report, he thinks that she had heart attack.   Stroke Mother    Cancer Father    Kidney cancer Father    Hypertension Sister    Heart disease Maternal Grandmother    Alcohol abuse Maternal Grandfather    Mental illness Paternal Grandmother    Learning disabilities Paternal Grandmother        Alzheimer's    Stroke Paternal Grandfather    Colon cancer Neg Hx    Colon polyps Neg Hx    Esophageal cancer Neg Hx    Rectal cancer Neg Hx    Stomach cancer Neg Hx    Social History:  reports that he has never smoked. He has never used smokeless tobacco. He reports that he does not drink alcohol and does not use drugs. Allergies:  Allergies  Allergen Reactions   Azithromycin Nausea And Vomiting and Other (See Comments)    Chest tightness  Other Reaction(s): GI Intolerance   Iodinated Contrast Media Itching and Nausea Only    Probably needs prednisone prep prior to contrast   Facility-Administered Medications Prior to  Admission  Medication Dose Route Frequency Provider Last Rate Last Admin   [DISCONTINUED] 0.9 %  sodium chloride infusion  250 mL Intravenous PRN Sherren Kerns, MD       [DISCONTINUED] sodium chloride flush (NS) 0.9 % injection 3 mL  3 mL Intravenous Q12H Fields, Janetta Hora, MD       [DISCONTINUED] sodium chloride flush (NS) 0.9 % injection 3 mL  3 mL Intravenous PRN Sherren Kerns, MD       Medications Prior to Admission  Medication Sig Dispense Refill   atenolol (TENORMIN) 25 MG tablet Take 50 mg by mouth daily.     atorvastatin (LIPITOR) 40 MG tablet Take 40 mg by mouth daily.     cyclobenzaprine (FLEXERIL) 10 MG tablet Take 1 tablet (10 mg total) by mouth 2 (two) times daily  as needed for muscle spasms. 20 tablet 0   EPINEPHrine 0.3 mg/0.3 mL IJ SOAJ injection Inject 0.3 mg into the muscle as needed for anaphylaxis. 1 each 0   loratadine (CLARITIN) 10 MG tablet Take 10 mg by mouth daily.     multivitamin (RENA-VIT) TABS tablet Take 1 tablet by mouth daily.     sevelamer carbonate (RENVELA) 800 MG tablet Take 1 tablet (800 mg total) by mouth 3 (three) times daily with meals. (Patient taking differently: Take 800-1,600 mg by mouth See admin instructions. Take 2 tablets by mouth with each meal and 1 tablet with snacks) 90 tablet 0   tetrahydrozoline-zinc (VISINE-AC) 0.05-0.25 % ophthalmic solution Place 2 drops into both eyes 3 (three) times daily as needed (dry eyes).     triamcinolone cream (KENALOG) 0.1 % Apply 1 application topically daily as needed (dry spots on face/excema).     Cetirizine HCl 10 MG CAPS Take 1 capsule (10 mg total) by mouth 1 day or 1 dose. (Patient not taking: Reported on 09/08/2022) 30 capsule 0   Cholecalciferol (VITAMIN D3) 125 MCG (5000 UT) CAPS Take 5,000 Units by mouth daily. (Patient not taking: Reported on 09/08/2022)     cinacalcet (SENSIPAR) 30 MG tablet Take 30 mg by mouth at bedtime. (Patient not taking: Reported on 09/08/2022)  11   Ferrous Sulfate (IRON)  325 (65 Fe) MG TABS Take 325 mg by mouth at bedtime. (Patient not taking: Reported on 09/08/2022)     OVER THE COUNTER MEDICATION Take 1 tablet by mouth daily. Zinc and Copper (Patient not taking: Reported on 09/08/2022)      Home: Home Living Family/patient expects to be discharged to:: Private residence Living Arrangements: Alone Available Help at Discharge: Family, Available 24 hours/day Type of Home: House Home Access: Stairs to enter Secretary/administrator of Steps: 1 Home Layout: Two level, 1/2 bath on main level Alternate Level Stairs-Number of Steps: flight Bathroom Shower/Tub: Walk-in shower Home Equipment: None Additional Comments: has already made plans to stay on the main level until he makes a full recovery  Functional History: Prior Function Prior Level of Function : Independent/Modified Independent, Driving Mobility Comments: Previously Independent without an AD ADLs Comments: Previously Independent to Mod I with pt stating he does not wear socks due to difficulty donning/doffing them and socks feet instead of washing secondary to pain in leaning forward to wash feet. Functional Status:  Mobility: Bed Mobility Overal bed mobility: Needs Assistance Bed Mobility: Rolling, Sidelying to Sit Rolling: Min assist (increased time) Sidelying to sit: Mod assist (some for legs and trunk) Sit to supine: Mod assist General bed mobility comments: Assist to elevate trunk into sitting and bring legs off of bed. Transfers Overall transfer level: Needs assistance Equipment used: Rolling walker (2 wheels) Transfers: Sit to/from Stand, Bed to chair/wheelchair/BSC Sit to Stand: Mod assist Bed to/from chair/wheelchair/BSC transfer type:: Step pivot Step pivot transfers: Min assist General transfer comment: Assist to power up and for stability. Verbal cues for hand placement. Ambulation/Gait Ambulation/Gait assistance: Min assist Gait Distance (Feet): 5 Feet Assistive device: Rolling  walker (2 wheels) Gait Pattern/deviations: Step-through pattern, Decreased stride length, Trunk flexed General Gait Details: Assist for balance and support. Tremulous Gait velocity: decr Gait velocity interpretation: <1.31 ft/sec, indicative of household ambulator    ADL: ADL Overall ADL's : Needs assistance/impaired Eating/Feeding: Independent, Bed level Grooming: Modified independent, Bed level Upper Body Bathing: Moderate assistance, Sitting, Cueing for compensatory techniques Lower Body Bathing: Maximal assistance, Sit to/from stand, Cueing for  compensatory techniques Upper Body Dressing : Moderate assistance, Sitting, Cueing for compensatory techniques Lower Body Dressing: Maximal assistance, Sit to/from stand, Cueing for compensatory techniques Toilet Transfer: Minimal assistance, BSC/3in1, Rolling walker (2 wheels) (Step-pivot transfer) Toilet Transfer Details (indicate cue type and reason): Simulated bed (raised) to recliner. Pt is Mod A sit<>stand and min A stand turn with RW, VCs for safe hand placement Toileting- Clothing Manipulation and Hygiene: Total assistance Toileting - Clothing Manipulation Details (indicate cue type and reason): Mod A sit<>stand General ADL Comments: OT educated pt in use of AE for increased safety and independence with ADLs and to assist in preventing pain exacerbation with bending and twisting. Pt celined sitting EOB this session secondary to pain and fatigue. Pt demonstrated understanding of AE in simulated tasks from bed level and/or teach back with pt stating he is agreeable to plan of trialling AE sitting EOB next OT session. OT also answered pt's general questions regarding available DME/AE for increased safety and independence in the bath/shower post acute discharge.  Cognition: Cognition Overall Cognitive Status: Within Functional Limits for tasks assessed Orientation Level: Oriented X4 Cognition Arousal/Alertness: Awake/alert Behavior During  Therapy: WFL for tasks assessed/performed Overall Cognitive Status: Within Functional Limits for tasks assessed General Comments: Pt is very talkative and very tangential, tends to talk but not look at you while talking (eyes are open)  Blood pressure 110/68, pulse 68, temperature 97.8 F (36.6 C), resp. rate 18, height 5\' 9"  (1.753 m), weight 81.2 kg, SpO2 96 %. Physical Exam Vitals and nursing note reviewed. Exam conducted with a chaperone present.  Constitutional:      Appearance: Normal appearance. He is normal weight.     Comments: Pt sitting up in bed initially- OT in room; NAD when sitting still, but really obviously in pain when sitting EOB or standing with OT; alert, appropriate  HENT:     Head: Normocephalic and atraumatic.     Right Ear: External ear normal.     Left Ear: External ear normal.     Nose: Nose normal. No congestion.     Mouth/Throat:     Mouth: Mucous membranes are dry.     Pharynx: Oropharynx is clear. No oropharyngeal exudate.  Eyes:     General:        Right eye: No discharge.        Left eye: No discharge.     Extraocular Movements: Extraocular movements intact.  Cardiovascular:     Rate and Rhythm: Normal rate and regular rhythm.     Heart sounds: Normal heart sounds. No murmur heard.    No gallop.  Pulmonary:     Effort: Pulmonary effort is normal. No respiratory distress.     Breath sounds: Normal breath sounds. No wheezing, rhonchi or rales.     Comments: Intractable hiccups esp when talking Also a hitch in his breathing when hit by pain, which occurred mainly with change in position Abdominal:     General: Bowel sounds are normal. There is distension.     Palpations: Abdomen is soft.     Tenderness: There is no abdominal tenderness.  Musculoskeletal:     Cervical back: Neck supple. No tenderness.     Comments: Ue's 5-/5 in biceps, triceps, WE< grip and FA B/L LE's at least 4-/5 B/L- couldn't test directly because eof therapy moving pt- but  able ot sit to stand with RW- appeared equal  Skin:    General: Skin is warm and dry.  Comments: R internal jugular catheter Covered Also no back incision- backside no ulcers  Neurological:     Mental Status: He is alert.     Comments: Intact to light touch in all 4 extremities except for ankles/feet- chronic neuropathy Jumped with mild touch on L4/S1- very TTP  Psychiatric:     Comments: Slightly anxious due to anticipation of pain     Results for orders placed or performed during the hospital encounter of 09/07/22 (from the past 24 hour(s))  Ferritin     Status: Abnormal   Collection Time: 09/16/22  3:38 AM  Result Value Ref Range   Ferritin 5,276 (H) 24 - 336 ng/mL  Iron and TIBC     Status: Abnormal   Collection Time: 09/16/22  3:38 AM  Result Value Ref Range   Iron 20 (L) 45 - 182 ug/dL   TIBC 756 (L) 433 - 295 ug/dL   Saturation Ratios 16 (L) 17.9 - 39.5 %   UIBC 109 ug/dL  CBC     Status: Abnormal   Collection Time: 09/16/22  3:38 AM  Result Value Ref Range   WBC 30.4 (H) 4.0 - 10.5 K/uL   RBC 3.22 (L) 4.22 - 5.81 MIL/uL   Hemoglobin 8.9 (L) 13.0 - 17.0 g/dL   HCT 18.8 (L) 41.6 - 60.6 %   MCV 79.8 (L) 80.0 - 100.0 fL   MCH 27.6 26.0 - 34.0 pg   MCHC 34.6 30.0 - 36.0 g/dL   RDW 30.1 60.1 - 09.3 %   Platelets 278 150 - 400 K/uL   nRBC 0.0 0.0 - 0.2 %  Renal function panel     Status: Abnormal   Collection Time: 09/16/22  3:38 AM  Result Value Ref Range   Sodium 127 (L) 135 - 145 mmol/L   Potassium 5.2 (H) 3.5 - 5.1 mmol/L   Chloride 89 (L) 98 - 111 mmol/L   CO2 18 (L) 22 - 32 mmol/L   Glucose, Bld 98 70 - 99 mg/dL   BUN 235 (H) 8 - 23 mg/dL   Creatinine, Ser 57.32 (H) 0.61 - 1.24 mg/dL   Calcium 8.0 (L) 8.9 - 10.3 mg/dL   Phosphorus 20.2 (H) 2.5 - 4.6 mg/dL   Albumin 1.6 (L) 3.5 - 5.0 g/dL   GFR, Estimated 4 (L) >60 mL/min   Anion gap 20 (H) 5 - 15   No results found.   Assessment/Plan: Diagnosis: MSSA bacteremia  with L5/S1 discitis/osteomyelitis-   Does the need for close, 24 hr/day medical supervision in concert with the patient's rehab needs make it unreasonable for this patient to be served in a less intensive setting? Yes Co-Morbidities requiring supervision/potential complications: endocarditis on Ancef; MSSA bacteremia- LE weakness; intractable hiccups; ESRD on HD; Leukocytosis up to 30k currently; SBO- improving Due to bowel management, safety, skin/wound care, disease management, medication administration, pain management, and patient education, does the patient require 24 hr/day rehab nursing? Yes Does the patient require coordinated care of a physician, rehab nurse, therapy disciplines of PT and OT to address physical and functional deficits in the context of the above medical diagnosis(es)? Yes Addressing deficits in the following areas: balance, endurance, locomotion, strength, transferring, bowel/bladder control, bathing, dressing, feeding, grooming, and toileting Can the patient actively participate in an intensive therapy program of at least 3 hrs of therapy per day at least 5 days per week? Yes The potential for patient to make measurable gains while on inpatient rehab is good Anticipated functional outcomes upon discharge  from inpatient rehab are supervision and min assist  with PT, supervision and min assist with OT, n/a with SLP. Estimated rehab length of stay to reach the above functional goals is: 2-3 weeks Anticipated discharge destination: Home Overall Rehab/Functional Prognosis: good  RECOMMENDATIONS: This patient's condition is appropriate for continued rehabilitative care in the following setting: CIR Patient has agreed to participate in recommended program. Yes Note that insurance prior authorization may be required for reimbursement for recommended care.  Comment:  Suggest increasing Oxycodone to 10-15 mg q4 hours for pain Try not to use IV Dilaudid since don't use in CIR.  Pt needs improvement in WBC before  CIR admission/per primary team/ID.  Might end up benefiting from long acting pain meds- since discitis/osteomyelitis is one of the most painful dx's to have.  Pt not eating- might benefit from appetite stimulant/supplements.  Will submit for inpt CIR admission.  7. Thank you for this consult.    I spent a total of  75  minutes on total care today- >50% coordination of care- due to  Due to review of chart, examination and interview of patient- was in room 45 minutes- with OT and monitored movement with OT; and typing up note-  Genice Rouge, MD 09/16/2022

## 2022-09-16 NOTE — Progress Notes (Signed)
[facility] Progress Note   Subjective:   Seen in room -on 2L [place] this am. Drowsy, falling asleep during questions  Objective Vitals:   09/15/22 1840 09/15/22 2145 09/16/22 0615 09/16/22 0857  BP: 98/61 (!) 92/51 (!) 101/59 110/68  Pulse: 87 80 66 68  Resp: 20 20 18    Temp: 98.1 F (36.7 C) 98.2 F (36.8 C) (!) 97.5 F (36.4 C) 97.8 F (36.6 C)  TempSrc: Oral Oral Oral   SpO2: 94% 92% 99% 96%  Weight:      Height:       Physical Exam General: lying in bed,nad  Heart: RRR, no mrg Lungs:CTAB, nml WOB on RA Abdomen:soft, NTND Extremities:no LE edema Dialysis Access: temp cath R groin   Filed Weights   09/09/22 1155 09/12/22 1623 09/12/22 2103  Weight: 81 kg 84.2 kg 81.2 kg    Intake/Output Summary (Last 24 hours) at 09/16/2022 1033 Last data filed at 09/15/2022 2200 Gross per 24 hour  Intake 650 ml  Output 0 ml  Net 650 ml     Additional Objective Labs: Basic Metabolic Panel: Recent Labs  Lab 09/13/22 0329 09/15/22 0127 09/16/22 0338  NA 131* 129* 127*  K 4.6 4.7 5.2*  CL 92* 91* 89*  CO2 22 19* 18*  GLUCOSE 120* 110* 98  BUN 59* 86* 103*  CREATININE 7.83* 11.52* 13.26*  CALCIUM 8.0* 7.8* 8.0*  PHOS 6.2* 10.6* 11.7*    Liver Function Tests: Recent Labs  Lab 09/13/22 0329 09/15/22 0127 09/16/22 0338  ALBUMIN 1.9* 1.7* 1.6*    CBC: Recent Labs  Lab 09/11/22 0436 09/12/22 1755 09/13/22 0329 09/14/22 1139 09/16/22 0338  WBC 28.5* 27.5* 27.0* 31.4* 30.4*  NEUTROABS 23.6*  --   --   --   --   HGB 10.9* 10.2* 10.3* 9.7* 8.9*  HCT 32.3* 29.7* 29.7* 28.5* 25.7*  MCV 81.6 81.6 81.6 81.0 79.8*  PLT 157 230 256 267 278    Blood Culture    Component Value Date/Time   SDES BLOOD RIGHT ANTECUBITAL 09/10/2022 1516   SDES BLOOD LEFT ARM 09/10/2022 1516   SPECREQUEST  09/10/2022 1516    BOTTLES DRAWN AEROBIC AND ANAEROBIC Blood Culture adequate volume   SPECREQUEST  09/10/2022 1516    BOTTLES DRAWN AEROBIC AND ANAEROBIC Blood  Culture adequate volume   CULT  09/10/2022 1516    NO GROWTH 5 DAYS Performed at [facility], [address] [place], [place] [address]    CULT  09/10/2022 1516    NO GROWTH 5 DAYS Performed at [facility], [address], [place], [place] [address]    REPTSTATUS 09/15/2022 FINAL 09/10/2022 1516   REPTSTATUS 09/15/2022 FINAL 09/10/2022 1516    CBG: Recent Labs  Lab 09/11/22 1809  GLUCAP 117*    Iron Studies:  Recent Labs    09/16/22 0338  IRON 20*  TIBC 129*  FERRITIN 5,276*   Lab Results  Component Value Date   INR 1.4 (H) 09/10/2022   INR 1.13 09/23/2017   INR 1.01 08/28/2016   Studies/Results: No results found.  Medications:   ceFAZolin (ANCEF) IV     chlorproMAZINE (THORAZINE) 12.5 mg in sodium chloride 0.9 % 25 mL IVPB 12.5 mg (09/15/22 1150)   methocarbamol (ROBAXIN) IV 500 mg (09/15/22 1854)    (feeding supplement) PROSource Plus  30 mL Oral BID BM   atenolol  50 mg Oral Daily   calcitRIOL  0.5 mcg Oral Q T,Th,Sat-1800  Chlorhexidine Gluconate Cloth  6 each Topical Daily   cinacalcet  30 mg Oral Q supper   multivitamin  1 tablet Oral QHS   pantoprazole  40 mg Oral Daily   sodium chloride flush  3 mL Intravenous Q12H   sucroferric oxyhydroxide  500 mg Oral TID WC    Dialysis Orders: [facility] 3:45 450/A1.5x 2K/2Ca EDW 85.7kg TDC  -Heparin 3000 U  -No ESA -Calcitriol 1.5      Assessment/Plan: 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 ID following. Repeat blood cultures ordered-+ MSSA.  On cefazolin. TDC removed 6/18. Completed 72 hour line holiday. TTE concerning for endocarditis. TEE cancelled d/t high risk factors. Rpt blood cultures 6/19 -neg.  WBCs 28->31. Appreciate ID input. Temp cath placed 6/21 following line holiday.  Continue cefazolin 6 weeks from 6/18.  Dialysis Access: non-tunneled fem HD cath in place.  Per IR notes unable to place RIJ [facility] d/t occlusion of SVC that was incompletely evaluated d/t contrast  allergy.  IR recommend consulting them for steroid prep prior to attempted L IJ TDC vs conversion of R fem to [facility].   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 Surgery consulting.  ESRD -  HD TTS.   HD off schedule overnight Friday. Back on schedule -next HD Tuesday.   Access -  Had L AVF ligation in 2019. Last seen by Dr. Edilia Bo 01/2022 -limited mobility in R arm so planning for L arm graft but had not scheduled surgery yet.   Hypertension/volume  - BP/volume acceptable. BP soft, try to challenge UF today as able  Anemia  - Hb 9.7 -8.9 Will start ESA today  Metabolic bone disease -  Corr Ca acceptable. Phos ^^^ --d/c'd sevelamer d/t SBO. Add Velphoro binder.  Nutrition - Renal diet when eating, currently on liquids diet. Add prot supp.  Dispo - possible CIR admission. Will need perm access placement at some point   Tomasa Blase PA-C [facility] 09/16/2022,10:33 AM

## 2022-09-16 NOTE — Progress Notes (Signed)
Occupational Therapy Treatment Patient Details Name: Ryan [name] MRN: [id number] DOB: 09-01-1960 Today's Date: 09/16/2022   History of present illness Pt is a 62 y/o male admitted secondary to acute back pain and found to have L5-S1 discitis/osteomyelitis, SBO and MSSA bacteremia likely 2/2 line infection. Pt had HD cath replaced with rt femoral temp cath.  PMH including but not limited to hypertension, hyperlipidemia, ESRD.   OT comments  This 62 yo male seen today to work on precursors to basic ADLs (ie: bed mobility, sit<>stand, and transfers). Pt varied from min -mod A for these tasks and was jumping and crying out due to pain intermittently with transitional movements. He will continue to benefit from acute OT with follow up rom intensive inpatient follow up therapy, >3 hours/day to then go home with niece with prn A as opposed to min-mod A as he is now.    Recommendations for follow up therapy are one component of a multi-disciplinary discharge planning process, led by the attending physician.  Recommendations may be updated based on patient status, additional functional criteria and insurance authorization.    Assistance Recommended at Discharge Frequent or constant Supervision/Assistance  Patient can return home with the following  A lot of help with walking and/or transfers;A lot of help with bathing/dressing/bathroom;Assistance with cooking/housework;Help with stairs or ramp for entrance;Assist for transportation;Direct supervision/assist for medications management;Direct supervision/assist for financial management   Equipment Recommendations  Tub/shower bench;BSC/3in1       Precautions / Restrictions Precautions Precautions: Fall Restrictions Weight Bearing Restrictions: No       Mobility Bed Mobility Overal bed mobility: Needs Assistance Bed Mobility: Rolling, Sidelying to Sit Rolling: Min assist (increased time) Sidelying to sit: Mod assist (some for legs and  trunk)            Transfers Overall transfer level: Needs assistance Equipment used: Rolling walker (2 wheels) Transfers: Sit to/from Stand, Bed to chair/wheelchair/BSC Sit to Stand: Mod assist     Step pivot transfers: Min assist     General transfer comment: Assist to power up and for stability. Verbal cues for hand placement.     Balance Overall balance assessment: Needs assistance Sitting-balance support: Bilateral upper extremity supported, Feet supported Sitting balance-Leahy Scale: Poor     Standing balance support: Bilateral upper extremity supported, Reliant on assistive device for balance Standing balance-Leahy Scale: Poor Standing balance comment: walker and additional A from therapist                           ADL either performed or assessed with clinical judgement   ADL Overall ADL's : Needs assistance/impaired                           Toilet Transfer Details (indicate cue type and reason): Simulated bed (raised) to recliner. Pt is Mod A sit<>stand and min A stand turn with RW, VCs for safe hand placement Toileting- Clothing Manipulation and Hygiene: Total assistance Toileting - Clothing Manipulation Details (indicate cue type and reason): Mod A sit<>stand            Extremity/Trunk Assessment Upper Extremity Assessment RUE Deficits / Details: Pt reports hx of R rotator cuff tear with subsequent weakness and decreased AROM/PROM in Right shoulder, particularly with shoulder flex/abduction. Pt presents with 2/5 MMT with limited AROM in Right shoulder and with PROM shoulder flection to approx 100 degrees. LUE Deficits / Details: Pt presents with  general L UE MMT 3-/5 and AROM shoulder flextion to approx. 105 degrees.            Vision Baseline Vision/History: 1 Wears glasses Ability to See in Adequate Light: 1 Impaired Patient Visual Report: No change from baseline            Cognition Arousal/Alertness:  Awake/alert Behavior During Therapy: WFL for tasks assessed/performed Overall Cognitive Status: Within Functional Limits for tasks assessed                                 General Comments: Pt is very talkative and very tangential, tends to talk but not look at you while talking (eyes are open)                   Pertinent Vitals/ Pain       Pain Assessment Pain Assessment: Faces Faces Pain Scale: Hurts whole lot Pain Descriptors / Indicators: Grimacing, Guarding, Stabbing, Jabbing (pt would jump and scream out when it really hurt) Pain Intervention(s): Limited activity within patient's tolerance, Monitored during session, Premedicated before session, Repositioned         Frequency  Min 2X/week        Progress Toward Goals  OT Goals(current goals can now be found in the care plan section)  Progress towards OT goals: Progressing toward goals (slowly due to back pain)  Acute Rehab OT Goals Patient Stated Goal: to rehab then home with family OT Goal Formulation: With patient Time For Goal Achievement: 09/25/22 Potential to Achieve Goals: Good  Plan Discharge plan needs to be updated       AM-PAC OT "6 Clicks" Daily Activity     Outcome Measure   Help from another person eating meals?: None Help from another person taking care of personal grooming?: A Little Help from another person toileting, which includes using toliet, bedpan, or urinal?: A Lot Help from another person bathing (including washing, rinsing, drying)?: A Lot Help from another person to put on and taking off regular upper body clothing?: A Lot Help from another person to put on and taking off regular lower body clothing?: A Lot 6 Click Score: 15    End of Session Equipment Utilized During Treatment: Rolling walker (2 wheels)  OT Visit Diagnosis: Unsteadiness on feet (R26.81);Other abnormalities of gait and mobility (R26.89);Muscle weakness (generalized) (M62.81);Pain Pain - part of  body:  (back)   Activity Tolerance Patient limited by pain   Patient Left in chair;with call bell/phone within reach;with chair alarm set   Nurse Communication          Time: 9629-5284 OT Time Calculation (min): 33 min  Charges: OT General Charges $OT Visit: 1 Visit OT Treatments $Self Care/Home Management : 23-37 mins  Lindon Romp OT Acute Rehabilitation Services Office [phone]    Evette Georges 09/16/2022, 2:23 PM

## 2022-09-16 NOTE — Progress Notes (Signed)
Pt receives out-pt HD at [facility] T[facility]. Will assist as needed.   Milana Salay Renal Navigator [phone] 

## 2022-09-17 ENCOUNTER — Inpatient Hospital Stay (HOSPITAL_COMMUNITY): Payer: No Typology Code available for payment source

## 2022-09-17 DIAGNOSIS — R509 Fever, unspecified: Secondary | ICD-10-CM | POA: Diagnosis not present

## 2022-09-17 DIAGNOSIS — T82598A Other mechanical complication of other cardiac and vascular devices and implants, initial encounter: Secondary | ICD-10-CM | POA: Diagnosis not present

## 2022-09-17 DIAGNOSIS — K56609 Unspecified intestinal obstruction, unspecified as to partial versus complete obstruction: Secondary | ICD-10-CM

## 2022-09-17 DIAGNOSIS — M4647 Discitis, unspecified, lumbosacral region: Secondary | ICD-10-CM | POA: Diagnosis not present

## 2022-09-17 HISTORY — PX: IR FLUORO GUIDE CV LINE RIGHT: IMG2283

## 2022-09-17 LAB — CBC
HCT: 25.2 % — ABNORMAL LOW (ref 39.0–52.0)
Hemoglobin: 8.7 g/dL — ABNORMAL LOW (ref 13.0–17.0)
MCH: 27.1 pg (ref 26.0–34.0)
MCHC: 34.5 g/dL (ref 30.0–36.0)
MCV: 78.5 fL — ABNORMAL LOW (ref 80.0–100.0)
Platelets: 299 10*3/uL (ref 150–400)
RBC: 3.21 MIL/uL — ABNORMAL LOW (ref 4.22–5.81)
RDW: 15.2 % (ref 11.5–15.5)
WBC: 26.2 10*3/uL — ABNORMAL HIGH (ref 4.0–10.5)
nRBC: 0 % (ref 0.0–0.2)

## 2022-09-17 LAB — BASIC METABOLIC PANEL
Anion gap: 22 — ABNORMAL HIGH (ref 5–15)
BUN: 122 mg/dL — ABNORMAL HIGH (ref 8–23)
CO2: 17 mmol/L — ABNORMAL LOW (ref 22–32)
Calcium: 8.2 mg/dL — ABNORMAL LOW (ref 8.9–10.3)
Chloride: 89 mmol/L — ABNORMAL LOW (ref 98–111)
Creatinine, Ser: 14.8 mg/dL — ABNORMAL HIGH (ref 0.61–1.24)
GFR, Estimated: 3 mL/min — ABNORMAL LOW (ref >60)
Glucose, Bld: 138 mg/dL — ABNORMAL HIGH (ref 70–99)
Potassium: 5.3 mmol/L — ABNORMAL HIGH (ref 3.5–5.1)
Sodium: 128 mmol/L — ABNORMAL LOW (ref 135–145)

## 2022-09-17 MED ORDER — HEPARIN SODIUM (PORCINE) 1000 UNIT/ML DIALYSIS: 1000.0000 [IU] | INTRAMUSCULAR | Status: DC | PRN

## 2022-09-17 MED ORDER — PENTAFLUOROPROP-TETRAFLUOROETH EX AERO: 1.0000 | INHALATION_SPRAY | CUTANEOUS | Status: DC | PRN

## 2022-09-17 MED ORDER — HEPARIN SODIUM (PORCINE) 1000 UNIT/ML DIALYSIS
3000.0000 [IU] | INTRAMUSCULAR | Status: DC | PRN
  Administered 2022-09-17: 3000 [IU] via INTRAVENOUS_CENTRAL
  Filled 2022-09-17 (×2): qty 3

## 2022-09-17 MED ORDER — LIDOCAINE-PRILOCAINE 2.5-2.5 % EX CREA: 1.0000 | TOPICAL_CREAM | CUTANEOUS | Status: DC | PRN

## 2022-09-17 MED ORDER — CEFAZOLIN SODIUM-DEXTROSE 2-4 GM/100ML-% IV SOLN
2.0000 g | Freq: Once | INTRAVENOUS | Status: AC
  Administered 2022-09-17: 2 g via INTRAVENOUS
  Filled 2022-09-17: qty 100

## 2022-09-17 MED ORDER — ANTICOAGULANT SODIUM CITRATE 4% (200MG/5ML) IV SOLN
5.0000 mL | Status: DC | PRN
  Filled 2022-09-17: qty 5

## 2022-09-17 MED ORDER — LIDOCAINE HCL 1 % IJ SOLN
INTRAMUSCULAR | Status: AC
  Filled 2022-09-17: qty 20

## 2022-09-17 MED ORDER — LIDOCAINE HCL 1 % IJ SOLN
20.0000 mL | Freq: Once | INTRAMUSCULAR | Status: AC
  Administered 2022-09-17: 20 mL
  Filled 2022-09-17: qty 20

## 2022-09-17 MED ORDER — CHLORHEXIDINE GLUCONATE 4 % EX SOLN
CUTANEOUS | Status: AC
  Filled 2022-09-17: qty 15

## 2022-09-17 MED ORDER — DARBEPOETIN ALFA 60 MCG/0.3ML IJ SOSY
60.0000 ug | PREFILLED_SYRINGE | INTRAMUSCULAR | Status: DC
  Administered 2022-09-18: 60 ug via SUBCUTANEOUS
  Filled 2022-09-17: qty 0.3

## 2022-09-17 MED ORDER — CHLORHEXIDINE GLUCONATE CLOTH 2 % EX PADS: 6.0000 | MEDICATED_PAD | Freq: Every day | CUTANEOUS | Status: DC

## 2022-09-17 MED ORDER — LIDOCAINE HCL (PF) 1 % IJ SOLN: 5.0000 mL | INTRAMUSCULAR | Status: DC | PRN

## 2022-09-17 MED ORDER — HEPARIN SODIUM (PORCINE) 1000 UNIT/ML IJ SOLN
INTRAMUSCULAR | Status: AC
  Filled 2022-09-17: qty 10

## 2022-09-17 MED ORDER — ALTEPLASE 2 MG IJ SOLR: 2.0000 mg | Freq: Once | INTRAMUSCULAR | Status: DC | PRN

## 2022-09-17 MED ORDER — HEPARIN SODIUM (PORCINE) 1000 UNIT/ML IJ SOLN
3000.0000 [IU] | Freq: Once | INTRAMUSCULAR | Status: AC
  Administered 2022-09-17: 3000 [IU]

## 2022-09-17 NOTE — Progress Notes (Signed)
Physical Therapy Treatment Patient Details Name: Ryan [name] MRN: [id number] DOB: [birth year] Today's Date: 09/17/2022   History of Present Illness Pt is a 62 y/o male admitted secondary to acute back pain and found to have L5-S1 discitis/osteomyelitis, SBO and MSSA bacteremia likely 2/2 line infection. Pt had HD cath replaced with rt femoral temp cath.  PMH including but not limited to hypertension, hyperlipidemia, ESRD.    PT Comments    Patient received supine in bed slightly lethargic; able to be awakened for rehab. Today focus of session included progressing bed mobility and transfers from EOB. Patient required min A at trunk from sidelying position to EOB. He is able to transfer from EOB to standing but required multimodal cues in order to complete task. Patient is able to maintain upright stance with RW support for clean up. Additionally pt required min guard for a step pivot <> BSC. Throughout session PT provided intermittent verbal cues to redirect patient to task. Recommend next session to progress transfer and ambulation distance. He will benefit from skilled rehab > 3 hours a day in order to maximize functional mobility, independence and promote safety.     Recommendations for follow up therapy are one component of a multi-disciplinary discharge planning process, led by the attending physician.  Recommendations may be updated based on patient status, additional functional criteria and insurance authorization.  Follow Up Recommendations       Assistance Recommended at Discharge Frequent or constant Supervision/Assistance  Patient can return home with the following Help with stairs or ramp for entrance;Assist for transportation;Assistance with cooking/housework;A little help with bathing/dressing/bathroom;A lot of help with walking and/or transfers   Equipment Recommendations  Rolling walker (2 wheels)    Recommendations for Other Services Rehab consult     Precautions /  Restrictions Precautions Precautions: Fall Restrictions Weight Bearing Restrictions: No     Mobility  Bed Mobility Overal bed mobility: Needs Assistance Bed Mobility: Rolling, Sidelying to Sit Rolling: Min assist Sidelying to sit: Mod assist   Sit to supine: Min assist   General bed mobility comments: Patient able to roll with verbal cues for LE and hand placement on rail; required mod assist for trunk support when sitting upright. Sit <> supine patient required minA to return bilateral LE into bed.    Transfers Overall transfer level: Needs assistance Equipment used: Rolling walker (2 wheels) Transfers: Sit to/from Stand, Bed to chair/wheelchair/BSC Sit to Stand: Min assist   Step pivot transfers: Min guard       General transfer comment: MinA for power up from bed; patient able to stabilize into upright stance with RW support. Provided multimodal cues for hand placement on RW. Pt able to remain upright for clean up. Pt able to navigate to [facility] with min guard for safety; able to power up from [facility] without physical assistance.    Ambulation/Gait               General Gait Details: Deferred gait to next session due to imbalance.   Stairs             Wheelchair Mobility    Modified Rankin (Stroke Patients Only)       Balance Overall balance assessment: Needs assistance Sitting-balance support: Bilateral upper extremity supported, Feet supported Sitting balance-Leahy Scale: Poor     Standing balance support: Bilateral upper extremity supported, Reliant on assistive device for balance Standing balance-Leahy Scale: Poor Standing balance comment: Required bilateral UE support from RW  Cognition Arousal/Alertness: Awake/alert Behavior During Therapy: WFL for tasks assessed/performed Overall Cognitive Status: Impaired/Different from baseline Area of Impairment: Attention, Following commands, Memory, Safety/judgement,  Awareness, Problem solving                   Current Attention Level: Sustained Memory: Decreased recall of precautions, Decreased short-term memory Following Commands: Follows one step commands inconsistently, Follows one step commands with increased time, Follows multi-step commands inconsistently, Follows multi-step commands with increased time Safety/Judgement: Decreased awareness of safety, Decreased awareness of deficits Awareness: Emergent Problem Solving: Requires verbal cues, Requires tactile cues, Slow processing, Decreased initiation General Comments: Patient agreeable to tasks and commands but presented with lethargy at beginning of session. Able to awaken once on side of the bed. Didn't maintain eye contact while speaking with PT.        Exercises      General Comments        Pertinent Vitals/Pain Pain Assessment Pain Assessment: Faces Faces Pain Scale: Hurts whole lot Pain Location: Lower Back Pain Descriptors / Indicators: Grimacing, Guarding, Stabbing Pain Intervention(s): Limited activity within patient's tolerance, Monitored during session    Home Living                          Prior Function            PT Goals (current goals can now be found in the care plan section) Acute Rehab PT Goals Patient Stated Goal: return to independence PT Goal Formulation: With patient Time For Goal Achievement: 09/25/22 Potential to Achieve Goals: Good Progress towards PT goals: Progressing toward goals    Frequency    Min 3X/week      PT Plan Current plan remains appropriate    Co-evaluation              AM-PAC PT "6 Clicks" Mobility   Outcome Measure  Help needed turning from your back to your side while in a flat bed without using bedrails?: A Little Help needed moving from lying on your back to sitting on the side of a flat bed without using bedrails?: A Lot Help needed moving to and from a bed to a chair (including a  wheelchair)?: A Little Help needed standing up from a chair using your arms (e.g., wheelchair or bedside chair)?: A Little Help needed to walk in hospital room?: Total Help needed climbing 3-5 steps with a railing? : Total 6 Click Score: 13    End of Session Equipment Utilized During Treatment: Gait belt Activity Tolerance: Other (comment);Patient tolerated treatment well Patient left: with call bell/phone within reach;in bed;with bed alarm set Nurse Communication: Mobility status PT Visit Diagnosis: Other abnormalities of gait and mobility (R26.89);Pain Pain - part of body:  (back)     Time: 1610-9604 PT Time Calculation (min) (ACUTE ONLY): 21 min  Charges:  $Gait Training: 8-22 mins                     Christene Lye, SPT Acute Rehabilitation Services [phone] Secure chat preferred     Christene Lye 09/17/2022, 1:48 PM

## 2022-09-17 NOTE — Progress Notes (Signed)
  Progress Note   Patient: Ryan [name] ZOX:[id number] DOB: 13-Oct-1960 DOA: 09/07/2022     10 DOS: the patient was seen and examined on 09/17/2022   Brief hospital course: 62 y.o. male with a history of hypertension, hyperlipidemia, ESRD on hemodialysis.  Patient presented secondary to low back pain and was found to have evidence of L5-S1 discitis/osteomyelitis in addition to MSSA bacteremia.  ID consulted.  Workup significant for possible endocarditis. Patient also found to have a small bowel obstruction.   Assessment and Plan: L5-S1 discitis/osteomyelitis MSSA bacteremia Confirmed possible early discitis/osteomyelitis on MRI imaging.   -Blood cultures positive for MSSA.   -ID consulted and pt is now on cefazolin IV with hemodialysis. Plan for 6 weeks of tx from 6/18  -Transthoracic echocardiogram significant for a large, rounded structure on the anterior leaflet of the mitral valve concerning for endocarditis. -ID initially recommended  transesophageal echocardiogram, however TEE was deferred secondary to risk/benefit profile.  -CT surg was consulted and recommended non-surgical management of mitral valve endocarditis. Leukocytosis slightly worsened but stabilized again.   ESRD on hemodialysis -Nephrology following   -Patient presented with a tunneled dialysis catheter which was removed on 6/18 secondary to bacteremia.   - Now s/p exchange of R femoral non-tunneled HD CVC 6/26   Intractable hiccups -continue thorazine as needed   Small bowel obstruction -Patient without a bowel movement for several days initially with SBO noted on abd CT with transition point -General surgery was consulted.  -symptoms later improved and diet was advanced -GI later signed off as of 6/25   Primary hypertension -Atenolol on hold secondary to soft BP -BP currently very well controlled   Elevated troponin -Previously noted. No associated chest pain. Presumed secondary to demand ischemia.   Lung  nodules -Noted on initial CT scan.  Initial recommendation for repeat CT scan of the chest in 18 to 24 months.  Repeat CT scan on 6/20 showed multiple new pulmonary nodules compatible with likely active infection and/or aspiration.   -given known endocarditis, likely septic emboli to lungs   Bladder wall thickening Noted incidentally on CT imaging.  No urinary symptoms and patient does not make urine secondary to ESRD. Unclear if this correlates with isolated infection or is related to overall infectious picture.      Subjective: Seen post-IR procedure this AM. Drowsy and sleepy  Physical Exam: Vitals:   09/17/22 1430 09/17/22 1500 09/17/22 1530 09/17/22 1600  BP: 109/63 (!) 108/53 117/74 100/67  Pulse: 78 79 92 89  Resp: 20 19 17 19   Temp:      TempSrc:      SpO2: 90% 96% 92% 97%  Weight:      Height:       General exam: Awake, laying in bed, in nad Respiratory system: Normal respiratory effort, no wheezing Cardiovascular system: regular rate, s1, s2 Gastrointestinal system: Soft, nondistended, positive BS Central nervous system: CN2-12 grossly intact, strength intact Extremities: Perfused, no clubbing Skin: Normal skin turgor, no notable skin lesions seen Psychiatry: Mood normal // no visual hallucinations   Data Reviewed:  Labs reviewed: Na 128, K 5.3, Cr 14.8, WBC 26.2  Family Communication: Pt in room, family not at bedside  Disposition: Status is: Inpatient Remains inpatient appropriate because: Severity of illness  Planned Discharge Destination: Home     Author: Rickey Barbara, MD 09/17/2022 4:47 PM  For on call review [email or web address].

## 2022-09-17 NOTE — Progress Notes (Signed)
   Patient Status: [facility] - In-pt  Assessment and Plan: Patient in need of venous access.   Malfunctioning non tunneled dialysis catheter Needs exchange/replacement  ______________________________________________________________________   History of Present Illness: Ryan [name] is a 62 y.o. male   Full Code status ESRD Right internal jugular tunneled catheter removed 6/18 in IR secondary bacteremia Rt femoral temp cath placed in IR 6/21 IMPRESSION: 1. Attempted though ultimately unsuccessful placement of right internal jugular approach temporary hemodialysis catheter secondary to concern for SVC occlusion. Contrast was unable to be administered secondary to patient's history of contrast allergy and lack of preprocedural steroid prep. 2. Successful placement of a right common femoral vein approach 24 cm temporary dialysis catheter with tip terminating within the inferior aspect of the IVC. The catheter is ready for immediate use.  Request made per Nephrology for non tunneled catheter to be exchanged/replaced in IR today Malfunction - nonfunctioning per dialysis RN Scheduled for this in IR today  Afeb Wbc 26.2 this am  Allergies and medications reviewed.   Review of Systems: A 12 point ROS discussed and pertinent positives are indicated in the HPI above.  All other systems are negative.    Vital Signs: BP (!) 107/59 (BP Location: Left Arm)   Pulse 73   Temp (!) 97.3 F (36.3 C) (Oral)   Resp 15   Ht 5\' 9"  (1.753 m)   Wt 180 lb 5.4 oz (81.8 kg)   SpO2 91%   BMI 26.63 kg/m   Physical Exam Vitals reviewed.  HENT:     Mouth/Throat:     Mouth: Mucous membranes are moist.  Cardiovascular:     Rate and Rhythm: Normal rate and regular rhythm.     Heart sounds: Normal heart sounds.  Pulmonary:     Effort: Pulmonary effort is normal.     Breath sounds: Normal breath sounds.  Abdominal:     Tenderness: There is no abdominal tenderness.  Skin:    General:  Skin is warm.  Neurological:     Mental Status: He is alert and oriented to person, place, and time.  Psychiatric:        Behavior: Behavior normal.      Imaging reviewed.   Labs:  COAGS: Recent Labs    09/10/22 1912  INR 1.4*    BMP: Recent Labs    09/13/22 0329 09/15/22 0127 09/16/22 0338 09/17/22 0346  NA 131* 129* 127* 128*  K 4.6 4.7 5.2* 5.3*  CL 92* 91* 89* 89*  CO2 22 19* 18* 17*  GLUCOSE 120* 110* 98 138*  BUN 59* 86* 103* 122*  CALCIUM 8.0* 7.8* 8.0* 8.2*  CREATININE 7.83* 11.52* 13.26* 14.80*  GFRNONAA 7* 5* 4* 3*   Scheduled today in IR for non tunneled Rt femoral dialysis catheter exchange/replacement Non functioning per Nephrology Wbc 26.2 this ; afeb Can consider tunneled dialysis catheter placement when wbc wnl  Pt is aware of procedure risks and benefits including but not limited to Infection; bleeding; vessel damage Agreeable to proceed Consent signed and in chart  Electronically Signed: Robet Leu, PA-C 09/17/2022, 7:07 AM   I spent a total of 15 minutes in face to face in clinical consultation, greater than 50% of which was counseling/coordinating care for venous access.

## 2022-09-17 NOTE — Progress Notes (Signed)
Called to get nursing report for dialysis was told by nurse sally the assigned nurse Maisie Fus would call me for report.

## 2022-09-17 NOTE — Progress Notes (Signed)
Inpatient Rehab Admissions Coordinator:   Stopped by to see pt x3.  In HD each time.  WBC down to 26.2.  Will f/u tomorrow.   Estill Dooms, PT, DPT Admissions Coordinator [phone] 09/17/22  3:23 PM

## 2022-09-17 NOTE — Progress Notes (Signed)
MEDICATION-RELATED CONSULT NOTE   IR Procedure Consult - Anticoagulant/Antiplatelet PTA/Inpatient Med List Review by Pharmacist   Procedure: HD catheter exchange    Completed: 6/26 ~9am  Post-Procedural bleeding risk per IR MD assessment:  low  Antithrombotic medications on inpatient or PTA profile prior to procedure:    none    Recommended restart time per IR Post-Procedure Guidelines:  n/a  Plan:     SCDs ordered for VTE prophylaxis. Pharmacologic VTE prophylaxis held on admit in case procedure needed.  Ryan [name], [place] 09/17/2022 10:39 AM

## 2022-09-17 NOTE — Progress Notes (Signed)
Spoke with patient's nurse regarding PIV consult received. Instructed to [place] current CVC access as needed for infusions. VU. Tomasita Morrow, RN VAST

## 2022-09-17 NOTE — Progress Notes (Signed)
[facility] Progress Note   Subjective:   Unable to complete dialysis yesterday d/t catheter dysfunction. Seen in room - had non-tunneled femoral catheter replaced  by IR this morning. For dialysis today   Objective Vitals:   09/16/22 1700 09/16/22 2108 09/17/22 0649 09/17/22 0937  BP: 111/64 (!) 102/50 (!) 107/59 120/65  Pulse: 79 78 73 88  Resp: 18 15 15 19   Temp: 97.9 F (36.6 C) 98.3 F (36.8 C) (!) 97.3 F (36.3 C) (!) 97.5 F (36.4 C)  TempSrc:  Oral Oral Oral  SpO2: 92% 91% 91% 93%  Weight:      Height:       Physical Exam General: lying in bed,nad  Heart: RRR, no mrg Lungs:CTAB, nml WOB on RA Abdomen:soft, NTND Extremities:no LE edema Dialysis Access: temp cath R groin   Filed Weights   09/12/22 1623 09/12/22 2103 09/16/22 1440  Weight: 84.2 kg 81.2 kg 81.8 kg    Intake/Output Summary (Last 24 hours) at 09/17/2022 1141 Last data filed at 09/17/2022 0644 Gross per 24 hour  Intake 237 ml  Output 0 ml  Net 237 ml     Additional Objective Labs: Basic Metabolic Panel: Recent Labs  Lab 09/13/22 0329 09/15/22 0127 09/16/22 0338 09/17/22 0346  NA 131* 129* 127* 128*  K 4.6 4.7 5.2* 5.3*  CL 92* 91* 89* 89*  CO2 22 19* 18* 17*  GLUCOSE 120* 110* 98 138*  BUN 59* 86* 103* 122*  CREATININE 7.83* 11.52* 13.26* 14.80*  CALCIUM 8.0* 7.8* 8.0* 8.2*  PHOS 6.2* 10.6* 11.7*  --     Liver Function Tests: Recent Labs  Lab 09/13/22 0329 09/15/22 0127 09/16/22 0338  ALBUMIN 1.9* 1.7* 1.6*    CBC: Recent Labs  Lab 09/11/22 0436 09/12/22 1755 09/13/22 0329 09/14/22 1139 09/16/22 0338 09/17/22 0346  WBC 28.5* 27.5* 27.0* 31.4* 30.4* 26.2*  NEUTROABS 23.6*  --   --   --   --   --   HGB 10.9* 10.2* 10.3* 9.7* 8.9* 8.7*  HCT 32.3* 29.7* 29.7* 28.5* 25.7* 25.2*  MCV 81.6 81.6 81.6 81.0 79.8* 78.5*  PLT 157 230 256 267 278 299    Blood Culture    Component Value Date/Time   SDES BLOOD RIGHT ANTECUBITAL 09/10/2022 1516   SDES BLOOD  LEFT ARM 09/10/2022 1516   SPECREQUEST  09/10/2022 1516    BOTTLES DRAWN AEROBIC AND ANAEROBIC Blood Culture adequate volume   SPECREQUEST  09/10/2022 1516    BOTTLES DRAWN AEROBIC AND ANAEROBIC Blood Culture adequate volume   CULT  09/10/2022 1516    NO GROWTH 5 DAYS Performed at [facility], [address], [place], [place] [address]    CULT  09/10/2022 1516    NO GROWTH 5 DAYS Performed at [facility], [address] [place], [place] [address]    REPTSTATUS 09/15/2022 FINAL 09/10/2022 1516   REPTSTATUS 09/15/2022 FINAL 09/10/2022 1516    CBG: Recent Labs  Lab 09/11/22 1809  GLUCAP 117*    Iron Studies:  Recent Labs    09/16/22 0338  IRON 20*  TIBC 129*  FERRITIN 5,276*    Lab Results  Component Value Date   INR 1.4 (H) 09/10/2022   INR 1.13 09/23/2017   INR 1.01 08/28/2016   Studies/Results: No results found.  Medications:  anticoagulant sodium citrate      ceFAZolin (ANCEF) IV 2 g (09/16/22 1742)   chlorproMAZINE (THORAZINE) 12.5 mg in sodium chloride 0.9 % 25  mL IVPB 12.5 mg (09/17/22 1040)   methocarbamol (ROBAXIN) IV 500 mg (09/15/22 1854)    (feeding supplement) PROSource Plus  30 mL Oral BID BM   atenolol  50 mg Oral Daily   calcitRIOL  0.5 mcg Oral Q T,Th,Sat-1800   Chlorhexidine Gluconate Cloth  6 each Topical Daily   [START ON 09/18/2022] Chlorhexidine Gluconate Cloth  6 each Topical Q0600   cinacalcet  30 mg Oral Q supper   feeding supplement (NEPRO CARB STEADY)  237 mL Oral BID BM   multivitamin  1 tablet Oral QHS   pantoprazole  40 mg Oral Daily   sodium chloride flush  3 mL Intravenous Q12H   sucroferric oxyhydroxide  500 mg Oral TID WC    Dialysis Orders: NW TTS 3:45 450/A1.5x 2K/2Ca EDW 85.7kg TDC  -Heparin 3000 U  -No ESA -Calcitriol 1.5      Assessment/Plan: 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 ID following. Repeat blood cultures ordered-+ MSSA.  On cefazolin. TDC removed 6/18. Completed 72  hour line holiday. TTE concerning for endocarditis. TEE cancelled d/t high risk factors. Rpt blood cultures 6/19 -neg.  WBCs 28->31>>26. Appreciate ID input. Temp cath placed 6/21 following line holiday.  Continue cefazolin 2g q HD x 6 weeks (11/04/22).  Dialysis Access: non-tunneled fem HD cath in place.  Per IR notes unable to place RIJ [facility] d/t occlusion of SVC that was incompletely evaluated d/t contrast allergy.  IR recommend consulting them for steroid prep prior to attempted L IJ TDC vs conversion of R fem to [facility]. Had temp cath exchanged 6/26 d/t poor function. 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 Improved. Surgery signed off.  ESRD -  HD TTS.   Unable to complete HD Tues d/t catheter dysfunction. HD today and back on schedule Thurs Access -  Had L AVF ligation in 2019. Last seen by Dr. Edilia Bo 01/2022 -limited mobility in R arm so planning for L arm graft but had not scheduled surgery yet.   Hypertension/volume  - BP/volume acceptable.UF today as able  Anemia  - Hb 9.7 -8.9. Start ESA  Metabolic bone disease -  Corr Ca acceptable. Phos ^^^ --d/c'd sevelamer d/t SBO. Add Velphoro binder.  Nutrition - Renal diet when eating, currently on liquids diet. Add prot supp.  Dispo - possible CIR admission.  Tomasa Blase PA-C  [facility] 09/17/2022,11:41 AM

## 2022-09-17 NOTE — Procedures (Addendum)
  Procedure:  Exchange R fem nontunneled HD CVCPreprocedure diagnosis: The primary encounter diagnosis was Acute right-sided low back pain without sciatica. Diagnoses of Fever, unspecified fever cause and Small bowel obstruction (HCC) were also pertinent to this visit. Postprocedure diagnosis: same EBL:    minimal Complications:   none immediate   if dysfunction persists he'll need premedication for contrast allergy so we can do venogram   See full dictation in [facility].  Thora Lance MD Main # [phone] Pager  [phone] Mobile [phone]

## 2022-09-17 NOTE — Progress Notes (Addendum)
Received patient in bed to unit.  Alert and oriented.  Informed consent signed and in chart.   TX duration:3:09  Patient tolerated well.  Transported back to the room  Alert, without acute distress.  Hand-off given to patient's nurse.   Access used: TDC right AVG thigh Access issues: yes catheter alarmed whole tx stopped running and tx ended early grace ejigiri,PA aware  Total UF removed: 1.7L Medication(s) given: none   09/17/22 1719  Vitals  Temp 97.8 F (36.6 C)  Temp Source Oral  BP 112/65  MAP (mmHg) 77  BP Location Right Arm  BP Method Automatic  Patient Position (if appropriate) Lying  Pulse Rate 89  Pulse Rate Source Monitor  ECG Heart Rate 90  Resp 17  Oxygen Therapy  SpO2 99 %  O2 Device Room Air  Patient Activity (if Appropriate) In bed  Pulse Oximetry Type Continuous  During Treatment Monitoring  HD Safety Checks Performed Yes  Intra-Hemodialysis Comments Tx completed  Dialysis Fluid Bolus Normal Saline  Bolus Amount (mL) 300 mL  Post Treatment  Dialyzer Clearance Heavily streaked  Duration of HD Treatment -hour(s) 3.09 hour(s)  Hemodialysis Intake (mL) 0 mL  Liters Processed 38.3  Fluid Removed (mL) 1700 mL  Tolerated HD Treatment Yes  Post-Hemodialysis Comments Pt tolerated tx. complication with cath running .  Hemodialysis Catheter Right Femoral vein Triple lumen Temporary (Non-Tunneled)  Placement Date/Time: 09/17/22 0856   Serial / Lot #: [id number]  Expiration Date: 07/12/26  Time Out: Correct patient;Correct site;Correct procedure  Maximum sterile barrier precautions: Hand hygiene;Cap;Mask;Sterile gown;Sterile gloves;Large sterile ...  Site Condition No complications  Blue Lumen Status Flushed;Heparin locked  Red Lumen Status Flushed;Heparin locked  Purple Lumen Status N/A  Catheter fill solution Heparin 1000 units/ml  Catheter fill volume (Arterial) 1.5 cc  Catheter fill volume (Venous) 1.5  Dressing Type Transparent  Dressing Status  Antimicrobial disc in place  Drainage Description None  Dressing Change Due 09/24/22  Post treatment catheter status Capped and Clamped      Carlas Vandyne S Lequisha [facility]

## 2022-09-18 ENCOUNTER — Inpatient Hospital Stay (HOSPITAL_COMMUNITY)
Admit: 2022-09-18 | Discharge: 2022-09-18 | Disposition: A | Discharge status: Home / Self Care | Payer: No Typology Code available for payment source | Attending: Internal Medicine | Admitting: Internal Medicine

## 2022-09-18 ENCOUNTER — Inpatient Hospital Stay (HOSPITAL_COMMUNITY): Payer: No Typology Code available for payment source

## 2022-09-18 DIAGNOSIS — R29818 Other symptoms and signs involving the nervous system: Secondary | ICD-10-CM | POA: Diagnosis not present

## 2022-09-18 DIAGNOSIS — R4182 Altered mental status, unspecified: Secondary | ICD-10-CM | POA: Diagnosis not present

## 2022-09-18 DIAGNOSIS — R509 Fever, unspecified: Secondary | ICD-10-CM | POA: Diagnosis not present

## 2022-09-18 DIAGNOSIS — I639 Cerebral infarction, unspecified: Secondary | ICD-10-CM | POA: Diagnosis not present

## 2022-09-18 DIAGNOSIS — M4647 Discitis, unspecified, lumbosacral region: Secondary | ICD-10-CM | POA: Diagnosis not present

## 2022-09-18 DIAGNOSIS — K56609 Unspecified intestinal obstruction, unspecified as to partial versus complete obstruction: Secondary | ICD-10-CM | POA: Diagnosis not present

## 2022-09-18 LAB — CBC
HCT: 25.2 % — ABNORMAL LOW (ref 39.0–52.0)
Hemoglobin: 8.9 g/dL — ABNORMAL LOW (ref 13.0–17.0)
MCH: 28.2 pg (ref 26.0–34.0)
MCHC: 35.3 g/dL (ref 30.0–36.0)
MCV: 79.7 fL — ABNORMAL LOW (ref 80.0–100.0)
Platelets: 305 10*3/uL (ref 150–400)
RBC: 3.16 MIL/uL — ABNORMAL LOW (ref 4.22–5.81)
RDW: 15.6 % — ABNORMAL HIGH (ref 11.5–15.5)
WBC: 24.9 10*3/uL — ABNORMAL HIGH (ref 4.0–10.5)
nRBC: 0 % (ref 0.0–0.2)

## 2022-09-18 LAB — RENAL FUNCTION PANEL
Albumin: 1.7 g/dL — ABNORMAL LOW (ref 3.5–5.0)
Anion gap: 18 — ABNORMAL HIGH (ref 5–15)
BUN: 83 mg/dL — ABNORMAL HIGH (ref 8–23)
CO2: 19 mmol/L — ABNORMAL LOW (ref 22–32)
Calcium: 8 mg/dL — ABNORMAL LOW (ref 8.9–10.3)
Chloride: 91 mmol/L — ABNORMAL LOW (ref 98–111)
Creatinine, Ser: 11.23 mg/dL — ABNORMAL HIGH (ref 0.61–1.24)
GFR, Estimated: 5 mL/min — ABNORMAL LOW (ref >60)
Glucose, Bld: 111 mg/dL — ABNORMAL HIGH (ref 70–99)
Phosphorus: 8.8 mg/dL — ABNORMAL HIGH (ref 2.5–4.6)
Potassium: 4.7 mmol/L (ref 3.5–5.1)
Sodium: 128 mmol/L — ABNORMAL LOW (ref 135–145)

## 2022-09-18 MED ORDER — HEPARIN SODIUM (PORCINE) 1000 UNIT/ML IJ SOLN
INTRAMUSCULAR | Status: AC
  Filled 2022-09-18: qty 4

## 2022-09-18 NOTE — TOC Progression Note (Signed)
Transition of Care [facility]) - Progression Note    Patient Details  Name: Ryan [name] MRN: [id number] Date of Birth: 10-Aug-1960  Transition [facility]) CM/SW Contact  Tom-Johnson, Hershal Coria, RN Phone Number: 09/18/2022, 2:03 PM  Clinical Narrative:     CIR following for potential admit, patient not medically ready at this time.  Patient's goddaughter, Ebony requesting [facility] POA paperwork and it will be filled out and signed by the patient's niece Laniece. Laniece's phone number is [phone].  Spiritual Consult placed for Advanced Directives and HCPOA.   CM will continue to follow as patient progresses with care towards discharge.       Barriers to Discharge: Continued Medical Work up  Expected Discharge Plan and Services   Discharge Planning Services: CM Consult   Living arrangements for the past 2 months: Single Family Home                                       Social Determinants of Health (SDOH) Interventions SDOH Screenings   Food Insecurity: No Food Insecurity (09/11/2022)  Housing: Low Risk  (09/11/2022)  Transportation Needs: No Transportation Needs (09/11/2022)  Utilities: Not At Risk (09/11/2022)  Tobacco Use: Low Risk  (09/17/2022)    Readmission Risk Interventions    09/10/2022    1:53 PM  Readmission Risk Prevention Plan  Transportation Screening Complete  PCP or Specialist Appt within 5-7 Days Complete  Home Care Screening Complete  Medication Review (RN CM) Referral to Pharmacy

## 2022-09-18 NOTE — Progress Notes (Addendum)
[facility] Progress Note   Subjective:  Seen in dialysis unit. Cathter with sluggish flow. Low BFR. He's also very restless  Objective Vitals:   09/18/22 0930 09/18/22 1000 09/18/22 1009 09/18/22 1030  BP: (P) 121/68 (!) 98/54 (!) 98/54 119/64  Pulse: (P) 90 94 92 92  Resp: (P) 19 (!) 21 16 (!) 25  Temp:      TempSrc:      SpO2: (P) 99% 100% 93% 91%  Weight:      Height:       Physical Exam General: lying in bed,nad  Heart: RRR, no mrg Lungs:CTAB, nml WOB on RA Abdomen:soft, NTND Extremities:no LE edema Dialysis Access: temp cath R groin   Filed Weights   09/17/22 1303 09/17/22 1719 09/18/22 0806  Weight: 83.1 kg 81.5 kg 79.7 kg    Intake/Output Summary (Last 24 hours) at 09/18/2022 1044 Last data filed at 09/18/2022 4098 Gross per 24 hour  Intake 375 ml  Output 1700 ml  Net -1325 ml     Additional Objective Labs: Basic Metabolic Panel: Recent Labs  Lab 09/15/22 0127 09/16/22 0338 09/17/22 0346 09/18/22 0234  NA 129* 127* 128* 128*  K 4.7 5.2* 5.3* 4.7  CL 91* 89* 89* 91*  CO2 19* 18* 17* 19*  GLUCOSE 110* 98 138* 111*  BUN 86* 103* 122* 83*  CREATININE 11.52* 13.26* 14.80* 11.23*  CALCIUM 7.8* 8.0* 8.2* 8.0*  PHOS 10.6* 11.7*  --  8.8*    Liver Function Tests: Recent Labs  Lab 09/15/22 0127 09/16/22 0338 09/18/22 0234  ALBUMIN 1.7* 1.6* 1.7*    CBC: Recent Labs  Lab 09/13/22 0329 09/14/22 1139 09/16/22 0338 09/17/22 0346 09/18/22 0234  WBC 27.0* 31.4* 30.4* 26.2* 24.9*  HGB 10.3* 9.7* 8.9* 8.7* 8.9*  HCT 29.7* 28.5* 25.7* 25.2* 25.2*  MCV 81.6 81.0 79.8* 78.5* 79.7*  PLT 256 267 278 299 305    Blood Culture    Component Value Date/Time   SDES BLOOD RIGHT ANTECUBITAL 09/10/2022 1516   SDES BLOOD LEFT ARM 09/10/2022 1516   SPECREQUEST  09/10/2022 1516    BOTTLES DRAWN AEROBIC AND ANAEROBIC Blood Culture adequate volume   SPECREQUEST  09/10/2022 1516    BOTTLES DRAWN AEROBIC AND ANAEROBIC Blood Culture adequate  volume   CULT  09/10/2022 1516    NO GROWTH 5 DAYS Performed at [facility], [address], [place], [place] [address]    CULT  09/10/2022 1516    NO GROWTH 5 DAYS Performed at [facility], [address], [place], [place] [address]    REPTSTATUS 09/15/2022 FINAL 09/10/2022 1516   REPTSTATUS 09/15/2022 FINAL 09/10/2022 1516    CBG: Recent Labs  Lab 09/11/22 1809  GLUCAP 117*    Iron Studies:  Recent Labs    09/16/22 0338  IRON 20*  TIBC 129*  FERRITIN 5,276*    Lab Results  Component Value Date   INR 1.4 (H) 09/10/2022   INR 1.13 09/23/2017   INR 1.01 08/28/2016   Studies/Results: IR Fluoro Guide CV Line Right  Result Date: 09/17/2022 CLINICAL DATA:  Poor function of non tunneled right femoral hemodialysis catheter. Patient has contrast allergy and was not pre-medicated EXAM: EXCHANGE OF NONTUNNELED CENTRAL VENOUS CATHETER UNDER FLUOROSCOPY FLUOROSCOPY: Radiation Exposure Index (as provided by the fluoroscopic device): 6 mGy air Kerma TECHNIQUE: After written informed consent was obtained, patient was placed in the supine position on angiographic table. right femoral venous catheter and surrounding skin prepped using  maximum barrier technique including cap and mask, sterile gown, sterile gloves, large sterile sheet, and Chlorhexidine as cutaneous antisepsis. The region was infiltrated locally with 1% lidocaine. Fluoroscopic inspection showed stable position of the catheter compared to previous exam. Under fluoroscopic guidance, the catheter was exchanged over a 035 guidewire for a new 24 cm Trialysis catheter, position with the tip at the level of the L3-4 interspace. Both lumens flushed and aspirated easily. Catheter was flushed per protocol and secured externally. The patient tolerated procedure well. COMPLICATIONS: COMPLICATIONS none IMPRESSION: 1. Technically successful exchange of non tunneled right femoral hemodialysis catheter. If dysfunction persists,  recommend pre-treating for contrast allergy before exchange to allow venography as needed. Electronically Signed   By: Corlis Leak M.D.   On: 09/17/2022 14:32    Medications:   ceFAZolin (ANCEF) IV 2 g (09/16/22 1742)   chlorproMAZINE (THORAZINE) 12.5 mg in sodium chloride 0.9 % 25 mL IVPB 12.5 mg (09/17/22 2214)   methocarbamol (ROBAXIN) IV 500 mg (09/15/22 1854)    (feeding supplement) PROSource Plus  30 mL Oral BID BM   atenolol  50 mg Oral Daily   calcitRIOL  0.5 mcg Oral Q T,Th,Sat-1800   Chlorhexidine Gluconate Cloth  6 each Topical Daily   cinacalcet  30 mg Oral Q supper   darbepoetin (ARANESP) injection - DIALYSIS  60 mcg Subcutaneous Q Thu-1800   feeding supplement (NEPRO CARB STEADY)  237 mL Oral BID BM   multivitamin  1 tablet Oral QHS   pantoprazole  40 mg Oral Daily   sodium chloride flush  3 mL Intravenous Q12H   sucroferric oxyhydroxide  500 mg Oral TID WC    Dialysis Orders: NW TTS 3:45 450/A1.5x 2K/2Ca EDW 85.7kg TDC  -Heparin 3000 U  -No ESA -Calcitriol 1.5      Assessment/Plan: 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 ID following. Repeat blood cultures ordered-+ MSSA.  On cefazolin. TDC removed 6/18. Completed 72 hour line holiday. TTE concerning for endocarditis. TEE cancelled d/t high risk factors. Rpt blood cultures 6/19 -neg.  WBCs 28->31>>26 >>24. Temp cath placed 6/21. Appreciate ID input.  Continue cefazolin 2g q HD x 6 weeks (until 11/04/22).  Dialysis Access: non-tunneled fem HD cath in place.  Per IR notes unable to place RIJ [facility] d/t occlusion of SVC that was incompletely evaluated d/t contrast allergy.  IR recommend consulting them for steroid prep prior to attempted L IJ TDC vs conversion of R fem to [facility]. Had temp cath exchanged 6/26 d/t poor function. 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 Improved. Surgery signed off.  ESRD -  HD TTS.  Unable to complete HD Tues d/t catheter dysfunction s/p temp cath exchange 6/26. HD today Hasn't really had a good dialysis treatment this week d/t cathter issues  Perm Access -  Had L AVF ligation in 2019. Last seen by Dr. Edilia Bo 01/2022 -limited mobility in R arm so planning for L arm graft but had not scheduled surgery yet.   Hypertension/volume  - BP/volume acceptable.UF today as able  Anemia  - Hb 9.7 -8.9. Start ESA  Metabolic bone disease -  Corr Ca acceptable. Phos ^^^ --d/c'd sevelamer d/t SBO. Add Velphoro binder.  Nutrition - Renal diet when eating, currently on liquids diet. Add prot supp.  Dispo - possible CIR admission..  Addendum: Catheter  dysfunctional but able to limp through dialysis today. Not sure will be functional on Saturday. IR contacted. Advised that they can exchange for longer non-tunneled cathter if needed this weekend. They are holding off on venogram/TDC conversion at this time.   Tomasa Blase PA-C [facility] 09/18/2022,10:44 AM

## 2022-09-18 NOTE — Progress Notes (Signed)
OT Cancellation Note  Patient Details Name: Ryan [name] MRN: [id number] DOB: 1961-03-10   Cancelled Treatment:    Reason Eval/Treat Not Completed: Patient at procedure or test/ unavailable (Pt currently off unit for HD. OT to re-attempt to see pt later this day as appropriate/available.)  Rosanne Sack "Orson Eva., OTR/L, MA Acute Rehab [phone]   Lendon Colonel 09/18/2022, 9:48 AM

## 2022-09-18 NOTE — Progress Notes (Signed)
   09/18/22 1155  Vitals  Pulse Rate 92  Resp (!) 23  BP (!) 105/58  SpO2 93 %  O2 Device Room Air  Post Treatment  Dialyzer Clearance Clotted  Duration of HD Treatment -hour(s) 2.35 hour(s)  Hemodialysis Intake (mL) 0 mL  Liters Processed 31.9  Fluid Removed (mL) 1100 mL  Tolerated HD Treatment Yes   Received patient in bed to unit.  Alert and oriented.  Informed consent signed and in chart.   TX duration:2.35hrs  Patient tolerated well.  Transported back to the room  Alert, without acute distress.  Hand-off given to patient's nurse.   Access used: R Fem HDC Access issues: positional run  Total UF removed: 1.1L Medication(s) given: oxycodone    [facility] T [facility]

## 2022-09-18 NOTE — Progress Notes (Signed)
Progress Note   Patient: Ryan [name]:[id number] DOB: 1960-10-02 DOA: 09/07/2022     11 DOS: the patient was seen and examined on 09/18/2022   Brief hospital course: 62 y.o. male with a history of hypertension, hyperlipidemia, ESRD on hemodialysis.  Patient presented secondary to low back pain and was found to have evidence of L5-S1 discitis/osteomyelitis in addition to MSSA bacteremia.  ID consulted.  Workup significant for possible endocarditis. Patient also found to have a small bowel obstruction.   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   -Blood cultures positive for MSSA.   -ID consulted and pt is now on cefazolin IV with hemodialysis. Plan for 6 weeks of tx from 6/18  -Transthoracic echocardiogram significant for a large, rounded structure on the anterior leaflet of the mitral valve concerning for endocarditis. -ID initially recommended  transesophageal echocardiogram, however TEE was deferred secondary to risk/benefit profile.  -CT surg was consulted and recommended non-surgical management of mitral valve endocarditis. Leukocytosis now improving -Report of slurred speech and some mentation changes reported by family -Ordered head CT, pending. May need f/u MRI brain if neg   ESRD on hemodialysis -Nephrology following   -Patient presented with a tunneled dialysis catheter which was removed on 6/18 secondary to bacteremia.   - Now s/p exchange of R femoral non-tunneled HD CVC 6/26. IR following   Intractable hiccups -continue thorazine as needed   Small bowel obstruction -Patient without a bowel movement for several days initially with SBO noted on abd CT with transition point -General surgery was consulted.  -symptoms later improved and diet was advanced -GI later signed off as of 6/25   Primary hypertension -Atenolol on hold secondary to soft BP -BP  currently very well controlled   Elevated troponin -Previously noted. No associated chest pain. Presumed secondary to demand ischemia.   Lung nodules -Noted on initial CT scan.  Initial recommendation for repeat CT scan of the chest in 18 to 24 months.  Repeat CT scan on 6/20 showed multiple new pulmonary nodules compatible with likely active infection and/or aspiration.   -given known endocarditis, most likely septic emboli to lungs   Bladder wall thickening Noted incidentally on CT imaging.  No urinary symptoms and patient does not make urine secondary to ESRD. Unclear if this correlates with isolated infection or is related to overall infectious picture.      Subjective: Seen on HD today. Pt without complaints, however noted to repeat himself multiple times  Physical Exam: Vitals:   09/18/22 1145 09/18/22 1155 09/18/22 1231 09/18/22 1254  BP: 110/65 (!) 105/58 (!) 156/124 107/62  Pulse: 98 92 95 89  Resp: 18 (!) 23    Temp:      TempSrc:      SpO2: 99% 93%    Weight:      Height:       General exam: Conversant, in no acute distress Respiratory system: normal chest rise, clear, no audible wheezing Cardiovascular system: regular rhythm, s1-s2 Gastrointestinal system: Nondistended, nontender, pos BS Central nervous system: No seizures, no tremors Extremities: No cyanosis, no joint deformities Skin: No rashes, no pallor Psychiatry: Affect normal // no auditory hallucinations   Data Reviewed:  Labs reviewed: Na 128, K 4.7, WBC 24.9, Hgb 8.9  Family Communication: Pt in room, family not at bedside  Disposition: Status is: Inpatient Remains inpatient appropriate because: Severity of illness  Planned Discharge Destination: Home     Author:  Rickey Barbara, MD 09/18/2022 3:58 PM  For on call review [email or web address].

## 2022-09-18 NOTE — Progress Notes (Signed)
Patient s/p non-tunneled dialysis catheter exchange 09/17/22. Patient received call from nephrology team that the new catheter is not functioning well during patient's hemodialysis treatment today. Case discussed with Dr. Milford Cage who states IR can exchange for a longer catheter if the patient is unable to receive HD.   Delorise Shiner, [place] with the nephrology team states the line is working well enough for now to complete HD but they will contact IR if line exchange is needed.   Alwyn Ren, [place] [phone] 09/18/2022, 1:40 PM

## 2022-09-18 NOTE — Progress Notes (Signed)
Inpatient Rehab Coordinator Note:  I met with patient and his god daughter, Karel Jarvis, at bedside to discuss CIR recommendations and goals/expectations of CIR stay.  We reviewed 3 hrs/day of therapy, physician follow up, and average length of stay 2 weeks (dependent upon progress) with goals of supervision to min assist.  We discussed need for 24/7 assist at least for a few weeks at discharge and they are working to figure out how to pull that together.  RN at bedside during visit.  Plan for further workup to include head CT.  Pending TDC once WBC count stabilizes.  Not medically ready  yet for CIR but will continue to follow.   Estill Dooms, PT, DPT Admissions Coordinator [phone] 09/18/22  1:17 PM

## 2022-09-18 NOTE — Progress Notes (Signed)
EEG complete - results pending 

## 2022-09-19 DIAGNOSIS — Z992 Dependence on renal dialysis: Secondary | ICD-10-CM | POA: Diagnosis not present

## 2022-09-19 DIAGNOSIS — N250 Renal osteodystrophy: Secondary | ICD-10-CM | POA: Diagnosis not present

## 2022-09-19 DIAGNOSIS — R569 Unspecified convulsions: Secondary | ICD-10-CM

## 2022-09-19 DIAGNOSIS — R4182 Altered mental status, unspecified: Secondary | ICD-10-CM

## 2022-09-19 DIAGNOSIS — D631 Anemia in chronic kidney disease: Secondary | ICD-10-CM | POA: Diagnosis not present

## 2022-09-19 DIAGNOSIS — B9561 Methicillin susceptible Staphylococcus aureus infection as the cause of diseases classified elsewhere: Secondary | ICD-10-CM | POA: Diagnosis not present

## 2022-09-19 DIAGNOSIS — I120 Hypertensive chronic kidney disease with stage 5 chronic kidney disease or end stage renal disease: Secondary | ICD-10-CM | POA: Diagnosis not present

## 2022-09-19 DIAGNOSIS — N186 End stage renal disease: Secondary | ICD-10-CM | POA: Diagnosis not present

## 2022-09-19 DIAGNOSIS — R7881 Bacteremia: Secondary | ICD-10-CM | POA: Diagnosis not present

## 2022-09-19 LAB — COMPREHENSIVE METABOLIC PANEL
ALT: 6 U/L (ref 0–44)
AST: 41 U/L (ref 15–41)
Albumin: 1.6 g/dL — ABNORMAL LOW (ref 3.5–5.0)
Alkaline Phosphatase: 104 U/L (ref 38–126)
Anion gap: 15 (ref 5–15)
BUN: 66 mg/dL — ABNORMAL HIGH (ref 8–23)
CO2: 23 mmol/L (ref 22–32)
Calcium: 7.8 mg/dL — ABNORMAL LOW (ref 8.9–10.3)
Chloride: 90 mmol/L — ABNORMAL LOW (ref 98–111)
Creatinine, Ser: 9.57 mg/dL — ABNORMAL HIGH (ref 0.61–1.24)
GFR, Estimated: 6 mL/min — ABNORMAL LOW (ref >60)
Glucose, Bld: 130 mg/dL — ABNORMAL HIGH (ref 70–99)
Potassium: 4.4 mmol/L (ref 3.5–5.1)
Sodium: 128 mmol/L — ABNORMAL LOW (ref 135–145)
Total Bilirubin: 0.5 mg/dL (ref 0.3–1.2)
Total Protein: 7 g/dL (ref 6.5–8.1)

## 2022-09-19 LAB — CBC
HCT: 24.9 % — ABNORMAL LOW (ref 39.0–52.0)
Hemoglobin: 8.5 g/dL — ABNORMAL LOW (ref 13.0–17.0)
MCH: 27.3 pg (ref 26.0–34.0)
MCHC: 34.1 g/dL (ref 30.0–36.0)
MCV: 80.1 fL (ref 80.0–100.0)
Platelets: 293 10*3/uL (ref 150–400)
RBC: 3.11 MIL/uL — ABNORMAL LOW (ref 4.22–5.81)
RDW: 15.9 % — ABNORMAL HIGH (ref 11.5–15.5)
WBC: 27.2 10*3/uL — ABNORMAL HIGH (ref 4.0–10.5)
nRBC: 0 % (ref 0.0–0.2)

## 2022-09-19 MED ORDER — STROKE: EARLY STAGES OF RECOVERY BOOK
Freq: Once | Status: AC
  Filled 2022-09-19: qty 1

## 2022-09-19 MED ORDER — CHLORPROMAZINE HCL 25 MG PO TABS
25.0000 mg | ORAL_TABLET | Freq: Three times a day (TID) | ORAL | Status: DC | PRN
  Administered 2022-09-19: 25 mg via ORAL
  Filled 2022-09-19 (×2): qty 1

## 2022-09-19 MED ORDER — ATENOLOL 25 MG PO TABS
12.5000 mg | ORAL_TABLET | Freq: Every day | ORAL | Status: DC
  Administered 2022-09-20 – 2022-09-25 (×6): 12.5 mg via ORAL
  Filled 2022-09-19 (×7): qty 0.5

## 2022-09-19 MED ORDER — ATENOLOL 25 MG PO TABS: 12.5000 mg | ORAL_TABLET | Freq: Every day | ORAL | Status: DC

## 2022-09-19 MED ORDER — CHLORHEXIDINE GLUCONATE CLOTH 2 % EX PADS
6.0000 | MEDICATED_PAD | Freq: Every day | CUTANEOUS | Status: DC
  Administered 2022-09-19 – 2022-09-25 (×7): 6 via TOPICAL

## 2022-09-19 NOTE — Procedures (Signed)
Patient Name: MARIA GOTZ  MRN: [id number]  Epilepsy Attending: Charlsie Quest  Referring Physician/Provider: Jerald Kief, MD  Date: 09/18/2022 Duration: 24.29 mins  Patient history: 62yo M with ams getting eeg to evaluate for seizure.  Level of alertness: Awake  AEDs during EEG study: None  Technical aspects: This EEG study was done with scalp electrodes positioned according to the 10-20 International system of electrode placement. Electrical activity was reviewed with band pass filter of 1-70Hz , sensitivity of 7 uV/mm, display speed of 79mm/sec with a 60Hz  notched filter applied as appropriate. EEG data were recorded continuously and digitally stored.  Video monitoring was available and reviewed as appropriate.  Description: The posterior dominant rhythm consists of 7Hz  activity of moderate voltage (25-35 uV) seen predominantly in posterior head regions, symmetric and reactive to eye opening and eye closing. EEG showed continuous generalized 3 to 6 Hz theta-delta slowing. Physiologic photic driving was not seen during photic stimulation.  Hyperventilation was not performed.     ABNORMALITY - Continuous slow, generalized - Background slow  IMPRESSION: This study is suggestive of moderate diffuse encephalopathy, nonspecific etiology. No seizures or epileptiform discharges were seen throughout the recording.  Winnifred Dufford Annabelle Harman

## 2022-09-19 NOTE — Progress Notes (Signed)
[facility] Progress Note   Subjective: Seen in room. C/O sore throat. No myalgias no fever this AM.    Objective Vitals:   09/18/22 1829 09/18/22 2023 09/19/22 0536 09/19/22 0833  BP: 115/68 (!) 97/53 (!) 99/57 (!) 95/55  Pulse: 85 83 71 81  Resp:  20 20 18   Temp: 98.4 F (36.9 C) 99 F (37.2 C) 98.7 F (37.1 C) 98.9 F (37.2 C)  TempSrc:  Oral    SpO2: 93% 94% 99% 100%  Weight:   82.3 kg   Height:       Physical Exam General: Pleasant, NAD Heart: S1,S2 No M/R/G SR  Lungs: CTAB Anteriorly. Abdomen: NABS, NT Extremities: No LE edema Dialysis Access: R femoral Temp catheter drsg intact   Additional Objective Labs: Basic Metabolic Panel: Recent Labs  Lab 09/15/22 0127 09/16/22 0338 09/17/22 0346 09/18/22 0234 09/19/22 0424  NA 129* 127* 128* 128* 128*  K 4.7 5.2* 5.3* 4.7 4.4  CL 91* 89* 89* 91* 90*  CO2 19* 18* 17* 19* 23  GLUCOSE 110* 98 138* 111* 130*  BUN 86* 103* 122* 83* 66*  CREATININE 11.52* 13.26* 14.80* 11.23* 9.57*  CALCIUM 7.8* 8.0* 8.2* 8.0* 7.8*  PHOS 10.6* 11.7*  --  8.8*  --    Liver Function Tests: Recent Labs  Lab 09/16/22 0338 09/18/22 0234 09/19/22 0424  AST  --   --  41  ALT  --   --  6  ALKPHOS  --   --  104  BILITOT  --   --  0.5  PROT  --   --  7.0  ALBUMIN 1.6* 1.7* 1.6*   No results for input(s): "LIPASE", "AMYLASE" in the last 168 hours. CBC: Recent Labs  Lab 09/14/22 1139 09/16/22 0338 09/17/22 0346 09/18/22 0234 09/19/22 0424  WBC 31.4* 30.4* 26.2* 24.9* 27.2*  HGB 9.7* 8.9* 8.7* 8.9* 8.5*  HCT 28.5* 25.7* 25.2* 25.2* 24.9*  MCV 81.0 79.8* 78.5* 79.7* 80.1  PLT 267 278 299 305 293   Blood Culture    Component Value Date/Time   SDES BLOOD RIGHT ANTECUBITAL 09/10/2022 1516   SDES BLOOD LEFT ARM 09/10/2022 1516   SPECREQUEST  09/10/2022 1516    BOTTLES DRAWN AEROBIC AND ANAEROBIC Blood Culture adequate volume   SPECREQUEST  09/10/2022 1516    BOTTLES DRAWN AEROBIC AND ANAEROBIC Blood Culture  adequate volume   CULT  09/10/2022 1516    NO GROWTH 5 DAYS Performed at [facility], [address], [place], [place] [address]    CULT  09/10/2022 1516    NO GROWTH 5 DAYS Performed at [facility], [address] [place], [place] [address]    REPTSTATUS 09/15/2022 FINAL 09/10/2022 1516   REPTSTATUS 09/15/2022 FINAL 09/10/2022 1516    Cardiac Enzymes: No results for input(s): "CKTOTAL", "CKMB", "CKMBINDEX", "TROPONINI" in the last 168 hours. CBG: No results for input(s): "GLUCAP" in the last 168 hours. Iron Studies: No results for input(s): "IRON", "TIBC", "TRANSFERRIN", "FERRITIN" in the last 72 hours. @lablastinr3 @ Studies/Results: EEG adult  Result Date: 09/19/2022 Charlsie Quest, MD     09/19/2022  7:11 AM Patient Name: Ryan [name] MRN: [id number] Epilepsy Attending: Charlsie Quest Referring Physician/Provider: Jerald Kief, MD Date: 09/18/2022 Duration: 24.29 mins Patient history: 62yo M with ams getting eeg to evaluate for seizure. Level of alertness: Awake AEDs during EEG study: None Technical aspects: This EEG study was done with scalp electrodes positioned according to the  10-20 International system of electrode placement. Electrical activity was reviewed with band pass filter of 1-70Hz , sensitivity of 7 uV/mm, display speed of 37mm/sec with a 60Hz  notched filter applied as appropriate. EEG data were recorded continuously and digitally stored.  Video monitoring was available and reviewed as appropriate. Description: The posterior dominant rhythm consists of 7Hz  activity of moderate voltage (25-35 uV) seen predominantly in posterior head regions, symmetric and reactive to eye opening and eye closing. EEG showed continuous generalized 3 to 6 Hz theta-delta slowing. Physiologic photic driving was not seen during photic stimulation.  Hyperventilation was not performed.   ABNORMALITY - Continuous slow, generalized - Background slow IMPRESSION: This study is  suggestive of moderate diffuse encephalopathy, nonspecific etiology. No seizures or epileptiform discharges were seen throughout the recording. Charlsie Quest   MR BRAIN WO CONTRAST  Result Date: 09/19/2022 CLINICAL DATA:  Initial evaluation for neuro deficit, stroke. EXAM: MRI HEAD WITHOUT CONTRAST TECHNIQUE: Multiplanar, multiecho pulse sequences of the brain and surrounding structures were obtained without intravenous contrast. COMPARISON:  CT from earlier the same day. FINDINGS: Brain: Examination technically limited by motion and patient's inability to tolerate the full length of the study. Cerebral volume within normal limits. No significant cerebral white matter disease for age. Scattered predominantly subcentimeter foci of restricted diffusion are seen involving the bilateral cerebral hemispheres and right cerebellum, consistent with small acute ischemic infarcts. For reference purposes, the largest infarct seen at the right cerebellum and measures 8 mm. Probable additional subtle evolving infarct noted at the right ventral pons (series 5, image 73). No associated hemorrhage or mass effect. No mass lesion, midline shift or mass effect. No hydrocephalus or extra-axial fluid collection. Vascular: Major intracranial vascular flow voids are maintained. Skull and upper cervical spine: Craniocervical junction grossly within normal limits. Bone marrow signal intensity grossly normal. No scalp soft tissue abnormality. Sinuses/Orbits: Globes orbital soft tissues within normal limits. Paranasal sinuses are largely clear. No significant mastoid effusion. Other: None. IMPRESSION: 1. Technically limited exam due to motion and the patient's inability to tolerate the full study. 2. Scattered subcentimeter acute ischemic infarcts involving the bilateral cerebral hemispheres and right cerebellum. No associated hemorrhage or mass effect. Electronically Signed   By: Rise Mu M.D.   On: 09/19/2022 00:56   CT  HEAD WO CONTRAST ( )  Result Date: 09/18/2022 CLINICAL DATA:  Mental status change, unknown cause Has endocarditis with known septic emboli to lungs EXAM: CT HEAD WITHOUT CONTRAST TECHNIQUE: Contiguous axial images were obtained from the base of the skull through the vertex without intravenous contrast. RADIATION DOSE REDUCTION: This exam was performed according to the departmental dose-optimization program which includes automated exposure control, adjustment of the mA and/or kV according to patient size and/or use of iterative reconstruction technique. COMPARISON:  None FINDINGS: Brain: No hemorrhage. No hydrocephalus. No extra-axial fluid collection. No mass effect. There is subcortical hypodensity along the superior frontal lobes bilaterally (series 7, image 27) which is nonspecific but favored to represent sequela of chroinc microvascular ischemic change Vascular: No hyperdense vessel or unexpected calcification. Skull: Normal. Negative for fracture or focal lesion. Sinuses/Orbits: No middle ear or mastoid effusion paranasal sinuses are clear orbits are unremarkable Other: None. IMPRESSION: No acute abnormality or definite finding to suggest septic emboli on this non contrast enhanaced head CT. If there is high clinical concern for an infarct or intracranial infection, further evaluation with a contrast enhanaced brain MRI is recommended. Electronically Signed   By: Lorenza Cambridge M.D.   On:  09/18/2022 16:11   Medications:   ceFAZolin (ANCEF) IV 2 g (09/18/22 1742)   methocarbamol (ROBAXIN) IV 500 mg (09/15/22 1854)    (feeding supplement) PROSource Plus  30 mL Oral BID BM   [START ON 09/20/2022] atenolol  12.5 mg Oral Daily   calcitRIOL  0.5 mcg Oral Q T,Th,Sat-1800   Chlorhexidine Gluconate Cloth  6 each Topical Daily   cinacalcet  30 mg Oral Q supper   darbepoetin (ARANESP) injection - DIALYSIS  60 mcg Subcutaneous Q Thu-1800   feeding supplement (NEPRO CARB STEADY)  237 mL Oral BID BM    multivitamin  1 tablet Oral QHS   pantoprazole  40 mg Oral Daily   sodium chloride flush  3 mL Intravenous Q12H   sucroferric oxyhydroxide  500 mg Oral TID WC     Dialysis Orders: [facility] TTS 3:45 450/A1.5x 2K/2Ca EDW 85.7kg TDC  -Heparin 3000 units IV TIW  -No ESA -Calcitriol 1.5 MCG PO TIW     Assessment/Plan: 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 ID following. Repeat blood cultures ordered-+ MSSA.  On cefazolin. TDC removed 6/18. Completed 72 hour line holiday. TTE concerning for endocarditis. TEE cancelled d/t high risk factors. Rpt blood cultures 6/19 -neg.  WBCs 28->31>>26 >>24. Temp cath placed 6/21. Appreciate ID input.  Continue cefazolin 2g q HD x 6 weeks (until 11/04/22).  Dialysis Access: non-tunneled fem HD cath in place.  Per IR notes unable to place [facility] d/t occlusion of SVC that was incompletely evaluated d/t contrast allergy.  IR recommend consulting them for steroid prep prior to attempted L IJ TDC vs conversion of R fem to [facility]. Had temp cath exchanged 6/26 d/t poor function. Spoke with IR PA this morning 6/27 about getting venogram done and evaluate for [facility] placement --await further input for plan  Possible L5-S1 osteomyelitis/discitis - as per #1  SBO - on CT Ab 6/19. Improved. Surgery signed off.  ESRD -  HD TTS.  Unable to complete HD Tues d/t catheter dysfunction s/p temp cath exchange 6/26. Next HD 09/20/2022.   Perm Access -  Had L AVF ligation in 2019. Last seen by Dr. Edilia Bo 01/2022 -limited mobility in R arm so planning for L arm graft but had not scheduled surgery yet.   Hypertension/volume  - BP actually on low side with SBP 90s. Very much under OP EDW if weights are correct. Lower EDW on discharge Get standing wts .UF as tolerated.  Anemia  - Hb 9.7-8.9. Started ESA. No iron load with infection.   Metabolic bone disease -  Corr Ca acceptable. Phos 8.8. d/c'd sevelamer d/t SBO. Add Velphoro binder.  Nutrition - Renal diet when eating,  currently on liquids diet. Add prot supp.  Dispo - possible CIR admission..   Addendum: Catheter dysfunctional but able to limp through dialysis today. Not sure will be functional on Saturday. IR contacted. Advised that they can exchange for longer non-tunneled cathter if needed this weekend. They are holding off on venogram/TDC conversion at this time.   Slade Pierpoint H. Jaeli Grubb NP-C 09/19/2022, 10:45 AM  [employer] [phone]

## 2022-09-19 NOTE — TOC Progression Note (Signed)
Transition of Care [facility]) - Progression Note    Patient Details  Name: Ryan [name] MRN: [id number] Date of Birth: May 09, 1960  [facility] CM/SW Contact  Tom-Johnson, Hershal Coria, RN Phone Number: 09/19/2022, 4:19 PM  Clinical Narrative:     Patient's workup significant for possible Endocarditis, Cardiology following. Patient also found to have a Small Bowel Obstruction which improved, SX signed off.  Brain MRI showed Septic Emboli in Bilateral Hemispheres and Rt Cerebellum, Neurology following.  Patient had exchange of Rt Femoral non-Tunneled HD CVC on 09/17/22,  Continues inpatient HD and Nephrology following.  CIR following for possible admit.  CM will continue to follow as patient progresses with care towards discharge.       Barriers to Discharge: Continued Medical Work up  Expected Discharge Plan and Services   Discharge Planning Services: CM Consult   Living arrangements for the past 2 months: Single Family Home                                       Social Determinants of Health (SDOH) Interventions SDOH Screenings   Food Insecurity: No Food Insecurity (09/11/2022)  Housing: Low Risk  (09/11/2022)  Transportation Needs: No Transportation Needs (09/11/2022)  Utilities: Not At Risk (09/11/2022)  Tobacco Use: Low Risk  (09/17/2022)    Readmission Risk Interventions    09/10/2022    1:53 PM  Readmission Risk Prevention Plan  Transportation Screening Complete  PCP or Specialist Appt within 5-7 Days Complete  Home Care Screening Complete  Medication Review (RN CM) Referral to Pharmacy

## 2022-09-19 NOTE — Progress Notes (Signed)
Pharmacy Antibiotic Note  Ryan [name] is a 61 y.o. male with ESRD on HD via RIJ admitted on 09/07/2022 with  MSSA bacteremia with native mitral valve endocarditis, L5-S1 discitis/osteomyelitis, and possible pulmonary septic emboli s/p 72h line holiday .  Pharmacy has been consulted for cefazolin dosing.  Plan is for cefazolin for 8 weeks (thru 8/13). He was changed to 2g after HD sessions on 6/25. Tm 99 and WBC elevated at 27.2.  Plan: Continue cefazolin 2 g IV after HD on TTS F/U HD schedule and doses are charted as given Pharmacy will continue to follow along  Height: 5\' 9"  (175.3 cm) Weight: 82.3 kg (181 lb 7 oz) IBW/kg (Calculated) : 70.7  Temp (24hrs), Avg:98.8 F (37.1 C), Min:98.4 F (36.9 C), Max:99 F (37.2 C)  Recent Labs  Lab 09/14/22 1139 09/15/22 0127 09/16/22 0338 09/17/22 0346 09/18/22 0234 09/19/22 0424  WBC 31.4*  --  30.4* 26.2* 24.9* 27.2*  CREATININE  --  11.52* 13.26* 14.80* 11.23* 9.57*     Estimated Creatinine Clearance: 8.1 mL/min (A) (by C-G formula based on SCr of 9.57 mg/dL (H)).    Allergies  Allergen Reactions   Azithromycin Nausea And Vomiting and Other (See Comments)    Chest tightness  Other Reaction(s): GI Intolerance   Iodinated Contrast Media Itching and Nausea Only    Probably needs prednisone prep prior to contrast    Antimicrobials this admission: vanc x 1 on 6/16 cefepime x 1 on 6/16 metronidazole x 1 on 6/16 cefazolin 6/16 >>   Microbiology results: 6/16 blood: 4/4 MSSA  6/16 resp panel: neg 6/16 COVID: neg 6/17 blood: neg 6/19 blood: neg 6/20 MRSA PCR: neg   Thank you for involving pharmacy in this patient's care.  Loura Back, PharmD, BCPS Clinical Pharmacist Clinical phone for 09/19/2022 is [phone] 09/19/2022 9:56 AM

## 2022-09-19 NOTE — Progress Notes (Signed)
Inpatient Rehab Admissions Coordinator:   Workup ongoing.  Will follow.   Estill Dooms, PT, DPT Admissions Coordinator [phone] 09/19/22  9:30 AM

## 2022-09-19 NOTE — Progress Notes (Signed)
This RN in room with on coming nurse Kim  to complete bedside report.  While in room this RN attempted an NIHSS assessment with pt for up to date information.   Male family member at bedside became verbally aggressive and demanded to stop assessment.  This RN attempted to explain importance of assessment for pt safety and appropriate care.  Male family member demanded assessment to be stopped.  This RN and shift nurse Kim repositioned pt in recliner with pillows behind back and head and floated heels for comfort.  Pt denied any pain at this time.  Chair alarm on call bell and bedside table within reach.

## 2022-09-19 NOTE — Progress Notes (Signed)
Occupational Therapy Treatment Patient Details Name: Ryan [name] MRN: [id number] DOB: 11/01/1960 Today's Date: 09/19/2022   History of present illness Pt is a 62 y/o male admitted secondary to acute back pain and found to have L5-S1 discitis/osteomyelitis, SBO and MSSA bacteremia likely 2/2 line infection. Pt had HD cath replaced with rt femoral temp cath. 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  PMH including but not limited to hypertension, hyperlipidemia, ESRD.   OT comments  Pt sitting EOB with PT present and finishing session upon OT arrival. Pt agreeable to participation in skilled OT session. OT reassessed pt this session secondary to pt with new findings on MRI of scattered infarcts on B cerebral hemispheres, and R cerebellar infarct. Pt largely functional level largely unchanged from last skilled OT session. However, pt presents with new cognitive deficits noted and decreased balance this day (see below for additional details). Pt currently demonstrates ability to complete UB ADLs with Mod I to Mod assist, LB ADLs with Mod to Max assist without the use of AE, and functional transfers with Mod assist with pt requiring increased cues and time for processing, sequencing, and initiation. A new OT goal was added this day to address cognition. Pt participated well throughout session and reported high level of motivation to continue to participate in skilled rehab services. Pt will benefit from continued acute skilled OT services to address deficits outlined below and increase safety and independence with ADLs and functional transfers/mobility. Discharge plan remains appropriate.     Recommendations for follow up therapy are one component of a multi-disciplinary discharge planning process, led by the attending physician.  Recommendations may be updated based on patient status, additional functional criteria and insurance authorization.     Assistance Recommended at Discharge Frequent or constant Supervision/Assistance  Patient can return home with the following  A lot of help with walking and/or transfers;A lot of help with bathing/dressing/bathroom;Assistance with cooking/housework;Help with stairs or ramp for entrance;Assist for transportation;Direct supervision/assist for medications management;Direct supervision/assist for financial management   Equipment Recommendations  Tub/shower bench;BSC/3in1    Recommendations for Other Services Rehab consult    Precautions / Restrictions Precautions Precautions: Fall Restrictions Weight Bearing Restrictions: No       Mobility Bed Mobility Overal bed mobility: Needs Assistance Bed Mobility: Sit to Supine       Sit to supine: Mod assist   General bed mobility comments: Pt requiring increased assistance this session.    Transfers Overall transfer level: Needs assistance Equipment used: Rolling walker (2 wheels) Transfers: Sit to/from Stand Sit to Stand: Mod assist                 Balance Overall balance assessment: Needs assistance Sitting-balance support: Bilateral upper extremity supported, Feet supported Sitting balance-Leahy Scale: Poor     Standing balance support: Bilateral upper extremity supported, Reliant on assistive device for balance Standing balance-Leahy Scale: Poor                             ADL either performed or assessed with clinical judgement   ADL Overall ADL's : Needs assistance/impaired Eating/Feeding: Modified independent;Bed level   Grooming: Modified independent;Bed level;Cueing for sequencing   Upper Body Bathing: Moderate assistance;Sitting;Cueing for compensatory techniques;Cueing for sequencing   Lower Body Bathing: Maximal assistance;Sit to/from stand;Cueing for compensatory techniques;Cueing for sequencing   Upper Body Dressing : Minimal assistance;Cueing for compensatory techniques;Sitting;Cueing for  sequencing   Lower Body Dressing: Maximal assistance;Sit to/from stand;Cueing for sequencing;Cueing for compensatory techniques   Toilet Transfer: Moderate assistance;Cueing for sequencing;Cueing for safety;Stand-pivot;BSC/3in1;Rolling walker (2 wheels)   Toileting- Clothing Manipulation and Hygiene: Moderate assistance;Cueing for sequencing;Cueing for compensatory techniques;Sitting/lateral lean;Sit to/from stand              Extremity/Trunk Assessment Upper Extremity Assessment Upper Extremity Assessment: Generalized weakness;RUE deficits/detail RUE Deficits / Details: Pt reports hx of R rotator cuff tear with subsequent weakness and decreased AROM/PROM in Right shoulder. Strength 3+ tp 4-/5 RUE Sensation: WNL RUE Coordination: WNL LUE Deficits / Details: Pt presents with general L UE MMT 3+ to 4-/5 and AROM shoulder flexion to approx. 105 degrees. LUE Sensation: WNL LUE Coordination: WNL   Lower Extremity Assessment Lower Extremity Assessment: Defer to PT evaluation        Vision   Vision Assessment?: Yes Eye Alignment: Within Functional Limits Ocular Range of Motion: Within Functional Limits Alignment/Gaze Preference: Within Defined Limits Tracking/Visual Pursuits: Able to track stimulus in all quads without difficulty Saccades: Within functional limits Convergence: Within functional limits Visual Fields: No apparent deficits Additional Comments: Pt reports vision at baseline.   Perception     Praxis Praxis Praxis: Impaired Praxis Impairment Details: Initiation;Motor planning (Organiztion) Praxis-Other Comments: Pt with noted new deficits in motor planning, initiation, and organization this session.    Cognition Arousal/Alertness: Awake/alert Behavior During Therapy: WFL for tasks assessed/performed Overall Cognitive Status: Impaired/Different from baseline Area of Impairment: Attention, Awareness, Problem solving, Memory, Safety/judgement, Following commands                    Current Attention Level: Sustained Memory: Decreased recall of precautions Following Commands: Follows one step commands with increased time Safety/Judgement: Decreased awareness of deficits, Decreased awareness of safety Awareness: Emergent Problem Solving: Requires verbal cues, Slow processing General Comments: Pt with increased difficulty with word finding and difficulty answering questions requiring increased processing (including How many quarters are there in $2.75?). This is new for pt. Pt previously worked in Education officer, environmental and in previous OT sesisons this admission demonstrated cognition WNL including ability to quickly process tasks/questions requiring higher level cognition.        Exercises      Shoulder Instructions       General Comments Pt requiring increased cues and increased time for processing, motor planning, and initiation this session. Pt also with new noted cognitive deficits.    Pertinent Vitals/ Pain       Pain Assessment Pain Assessment: Faces Faces Pain Scale: Hurts even more Pain Location: Lower Back Pain Descriptors / Indicators: Grimacing, Guarding Pain Intervention(s): Limited activity within patient's tolerance, Monitored during session, Repositioned  Home Living                                          Prior Functioning/Environment              Frequency  Min 2X/week        Progress Toward Goals  OT Goals(current goals can now be found in the care plan section)  Progress towards OT goals: Progressing toward goals  Acute Rehab OT Goals Patient Stated Goal: To continue participating in rehab ADL Goals Additional ADL Goal #2: Patient will demonstrate ability to successfully sequance and complete a 3 step therapeutic or functional activity requiring alternating attention with Mod I while sitting EOB for  5 or more minutes with Fair balance.  Plan Discharge plan remains appropriate;Other (comment)  (Goal added to address cognition.)    Co-evaluation                 AM-PAC OT "6 Clicks" Daily Activity     Outcome Measure   Help from another person eating meals?: None Help from another person taking care of personal grooming?: None Help from another person toileting, which includes using toliet, bedpan, or urinal?: A Lot Help from another person bathing (including washing, rinsing, drying)?: A Lot Help from another person to put on and taking off regular upper body clothing?: A Little Help from another person to put on and taking off regular lower body clothing?: A Lot 6 Click Score: 17    End of Session Equipment Utilized During Treatment: Rolling walker (2 wheels)  OT Visit Diagnosis: Unsteadiness on feet (R26.81);Other abnormalities of gait and mobility (R26.89);Muscle weakness (generalized) (M62.81);Pain;Other symptoms and signs involving cognitive function   Activity Tolerance Patient limited by pain;Other (comment) (Pt limited by current cognitive level.)   Patient Left in bed;with call bell/phone within reach;with bed alarm set   Nurse Communication Mobility status        Time: 1610-9604 OT Time Calculation (min): 27 min  Charges: OT General Charges $OT Visit: 1 Visit OT Treatments $Self Care/Home Management : 23-37 mins  Consuello Lassalle "Orson Eva., OTR/L, MA Acute Rehab [phone]   Lendon Colonel 09/19/2022, 6:41 PM

## 2022-09-19 NOTE — Progress Notes (Signed)
Physical Therapy Treatment Patient Details Name: Ryan [name] MRN: [id number] DOB: 04/30/1960 Today's Date: 09/19/2022   History of Present Illness Pt is a 62 y/o male admitted secondary to acute back pain and found to have L5-S1 discitis/osteomyelitis, SBO and MSSA bacteremia likely 2/2 line infection. Pt had HD cath replaced with rt femoral temp cath. 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  PMH including but not limited to hypertension, hyperlipidemia, ESRD.    PT Comments    Pt was seen for mobility on side of bed and standing, noted strength and coordination changes on LLE.  Pt struggles with buckling on knees to stand, as well as painful back to power up.  Will note that his ability to stand is less independent this day than last session, as well as being unsafe to walk more than sidestepping to side of bed.  Will recommend >3 hours a day therapy as planned if the option is open to him.  Follow along with him for therapy goals and to progress him as tolerated with balance and gait.  Follow goals as is on evaluation plan of care.  Recommendations for follow up therapy are one component of a multi-disciplinary discharge planning process, led by the attending physician.  Recommendations may be updated based on patient status, additional functional criteria and insurance authorization.  Follow Up Recommendations       Assistance Recommended at Discharge Frequent or constant Supervision/Assistance  Patient can return home with the following Help with stairs or ramp for entrance;Assist for transportation;Assistance with cooking/housework;A little help with bathing/dressing/bathroom;A lot of help with walking and/or transfers   Equipment Recommendations  Rolling walker (2 wheels)    Recommendations for Other Services Rehab consult     Precautions / Restrictions Precautions Precautions: Fall Restrictions Weight Bearing  Restrictions: No     Mobility  Bed Mobility Overal bed mobility: Needs Assistance Bed Mobility: Sidelying to Sit, Rolling Rolling: Min assist Sidelying to sit: Mod assist            Transfers Overall transfer level: Needs assistance Equipment used: Rolling walker (2 wheels) Transfers: Sit to/from Stand Sit to Stand: Mod assist           General transfer comment: pt is mainly weak on knees and struggling to power up    Ambulation/Gait Ambulation/Gait assistance: Min assist Gait Distance (Feet): 8 Feet Assistive device: Rolling walker (2 wheels) Gait Pattern/deviations: Step-to pattern, Knees buckling Gait velocity: reduced     General Gait Details: pt requires help to support and sustain standing initially then moer alert and focused   Stairs             Wheelchair Mobility    Modified Rankin (Stroke Patients Only)       Balance                                            Cognition Arousal/Alertness: Awake/alert   Overall Cognitive Status: Impaired/Different from baseline Area of Impairment: Problem solving, Attention, Awareness                   Current Attention Level: Sustained Memory: Decreased recall of precautions Following Commands: Follows one step commands with increased time Safety/Judgement: Decreased awareness of deficits, Decreased awareness of safety Awareness: Emergent Problem Solving: Requires verbal cues, Slow processing General Comments:  pt requires repetitive cues for sequencing tasks to get to side of bed and protect spin        Exercises      General Comments General comments (skin integrity, edema, etc.): pt is up to stand and requires repetitive instructions to get through sequence of sidesteps      Pertinent Vitals/Pain Pain Assessment Pain Assessment: Faces Faces Pain Scale: Hurts even more Pain Location: Lower Back Pain Descriptors / Indicators: Grimacing, Guarding Pain  Intervention(s): Monitored during session, Repositioned, Patient requesting pain meds-RN notified (pt struggled to verbalize the location of pain)    Home Living Family/patient expects to be discharged to:: Private residence Living Arrangements: Alone Available Help at Discharge: Family;Available 24 hours/day Type of Home: House Home Access: Stairs to enter   [employer] of Steps: 1            Prior Function            PT Goals (current goals can now be found in the care plan section) Acute Rehab PT Goals Patient Stated Goal: return to independence    Frequency    Min 3X/week      PT Plan Current plan remains appropriate    Co-evaluation              AM-PAC PT "6 Clicks" Mobility   Outcome Measure  Help needed turning from your back to your side while in a flat bed without using bedrails?: A Little Help needed moving from lying on your back to sitting on the side of a flat bed without using bedrails?: A Lot Help needed moving to and from a bed to a chair (including a wheelchair)?: A Lot Help needed standing up from a chair using your arms (e.g., wheelchair or bedside chair)?: A Lot Help needed to walk in hospital room?: A Little Help needed climbing 3-5 steps with a railing? : Total 6 Click Score: 13    End of Session Equipment Utilized During Treatment: Gait belt Activity Tolerance: Other (comment);Patient tolerated treatment well Patient left:  (with OT on side of bed) Nurse Communication: Mobility status PT Visit Diagnosis: Other abnormalities of gait and mobility (R26.89);Pain Pain - Right/Left:  (back) Pain - part of body:  (back)     Time: 1610-9604 PT Time Calculation (min) (ACUTE ONLY): 27 min  Charges:  $Gait Training: 8-22 mins $Therapeutic Activity: 8-22 mins       Ivar Drape 09/19/2022, 5:31 PM  Samul Dada, PT PhD Acute Rehab Dept. Number: [facility] [id number] and [facility] [phone]

## 2022-09-19 NOTE — Progress Notes (Signed)
Progress Note   Patient: Ryan [name] WJX:[id number] DOB: May 06, 1960 DOA: 09/07/2022     12 DOS: the patient was seen and examined on 09/19/2022   Brief hospital course: 62 y.o. male with a history of hypertension, hyperlipidemia, ESRD on hemodialysis.  Patient presented secondary to low back pain and was found to have evidence of L5-S1 discitis/osteomyelitis in addition to MSSA bacteremia.  ID consulted.  Workup significant for possible endocarditis. Patient also found to have a small bowel obstruction.   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   -Blood cultures positive for MSSA.   -ID consulted and pt is now on cefazolin IV with hemodialysis. Plan for 6 weeks of tx from 6/18  -Transthoracic echocardiogram significant for a large, rounded structure on the anterior leaflet of the mitral valve concerning for endocarditis. -Chest imaging was consistent with septic emboli -ID initially recommended  transesophageal echocardiogram, however TEE was deferred secondary to risk/benefit profile.  -CT surg was consulted and recommended non-surgical management of mitral valve endocarditis. Leukocytosis now improving -Report of slurred speech and some mentation changes reported by family. Brain MRI reviewed, findings concerning for septic emboli in bilateral hemispheres and R cerebellum. Reviewed with Neurology who agrees with septic emboli. Recommendation to cont abx and f/u NIHSS scale -Ordered and reviewed EEG, neg for seizuer    ESRD on hemodialysis -Nephrology following   -Patient presented with a tunneled dialysis catheter which was removed on 6/18 secondary to bacteremia.   - Now s/p exchange of R femoral non-tunneled HD CVC 6/26. IR following -Cont HD per Nephrology   Intractable hiccups -continue thorazine as needed   Small  bowel obstruction -Patient without a bowel movement for several days initially with SBO noted on abd CT with transition point -General surgery was consulted.  -symptoms later improved and diet was advanced -GI later signed off as of 6/25   Primary hypertension -Atenolol on hold secondary to soft BP -BP currently very well controlled   Elevated troponin -Previously noted. No associated chest pain. Presumed secondary to demand ischemia.   Lung nodules -Noted on initial CT scan.  Initial recommendation for repeat CT scan of the chest in 18 to 24 months.  Repeat CT scan on 6/20 showed multiple new pulmonary nodules compatible with likely active infection and/or aspiration.   -given known endocarditis, most likely septic emboli to lungs   Bladder wall thickening Noted incidentally on CT imaging.  No urinary symptoms and patient does not make urine secondary to ESRD. Unclear if this correlates with isolated infection or is related to overall infectious picture.      Subjective: Complaining of hiccups this AM.   Physical Exam: Vitals:   09/18/22 1829 09/18/22 2023 09/19/22 0536 09/19/22 0833  BP: 115/68 (!) 97/53 (!) 99/57 (!) 95/55  Pulse: 85 83 71 81  Resp:  20 20 18   Temp: 98.4 F (36.9 C) 99 F (37.2 C) 98.7 F (37.1 C) 98.9 F (37.2 C)  TempSrc:  Oral    SpO2: 93% 94% 99% 100%  Weight:   82.3 kg   Height:       General exam: Awake, laying in bed, in nad Respiratory system: Normal respiratory effort, no wheezing Cardiovascular system: regular rate, s1, s2 Gastrointestinal system: Soft, nondistended, positive BS Central nervous system: CN2-12 grossly intact, strength intact Extremities: Perfused, no clubbing Skin: Normal skin turgor, no notable skin lesions seen  Psychiatry: Mood normal // no visual hallucinations   Data Reviewed:  Labs reviewed: Na 128, K 4.4, Cr 9.57, WBC 27.2, Hgb 8.5  Family Communication: Pt in room, family not at bedside  Disposition: Status  is: Inpatient Remains inpatient appropriate because: Severity of illness  Planned Discharge Destination: Home     Author: Rickey Barbara, MD 09/19/2022 3:33 PM  For on call review [email or web address].

## 2022-09-20 LAB — COMPREHENSIVE METABOLIC PANEL
ALT: 6 U/L (ref 0–44)
AST: 52 U/L — ABNORMAL HIGH (ref 15–41)
Albumin: 1.5 g/dL — ABNORMAL LOW (ref 3.5–5.0)
Alkaline Phosphatase: 93 U/L (ref 38–126)
Anion gap: 16 — ABNORMAL HIGH (ref 5–15)
BUN: 87 mg/dL — ABNORMAL HIGH (ref 8–23)
CO2: 22 mmol/L (ref 22–32)
Calcium: 7.8 mg/dL — ABNORMAL LOW (ref 8.9–10.3)
Chloride: 90 mmol/L — ABNORMAL LOW (ref 98–111)
Creatinine, Ser: 11.38 mg/dL — ABNORMAL HIGH (ref 0.61–1.24)
GFR, Estimated: 5 mL/min — ABNORMAL LOW (ref >60)
Glucose, Bld: 115 mg/dL — ABNORMAL HIGH (ref 70–99)
Potassium: 4.7 mmol/L (ref 3.5–5.1)
Sodium: 128 mmol/L — ABNORMAL LOW (ref 135–145)
Total Bilirubin: 0.6 mg/dL (ref 0.3–1.2)
Total Protein: 6.7 g/dL (ref 6.5–8.1)

## 2022-09-20 LAB — LIPID PANEL
Cholesterol: 144 mg/dL (ref 0–200)
HDL: 16 mg/dL — ABNORMAL LOW (ref >40)
LDL Cholesterol: 99 mg/dL (ref 0–99)
Total CHOL/HDL Ratio: 9 RATIO
Triglycerides: 147 mg/dL (ref <150)
VLDL: 29 mg/dL (ref 0–40)

## 2022-09-20 LAB — CBC
HCT: 23.2 % — ABNORMAL LOW (ref 39.0–52.0)
Hemoglobin: 7.8 g/dL — ABNORMAL LOW (ref 13.0–17.0)
MCH: 27.7 pg (ref 26.0–34.0)
MCHC: 33.6 g/dL (ref 30.0–36.0)
MCV: 82.3 fL (ref 80.0–100.0)
Platelets: 292 10*3/uL (ref 150–400)
RBC: 2.82 MIL/uL — ABNORMAL LOW (ref 4.22–5.81)
RDW: 15.6 % — ABNORMAL HIGH (ref 11.5–15.5)
WBC: 22.5 10*3/uL — ABNORMAL HIGH (ref 4.0–10.5)
nRBC: 0 % (ref 0.0–0.2)

## 2022-09-20 LAB — HEMOGLOBIN A1C
Hgb A1c MFr Bld: 6.3 % — ABNORMAL HIGH (ref 4.8–5.6)
Mean Plasma Glucose: 134 mg/dL

## 2022-09-20 MED ORDER — ALTEPLASE 2 MG IJ SOLR
INTRAMUSCULAR | Status: AC
  Administered 2022-09-20: 2 mg
  Filled 2022-09-20: qty 2

## 2022-09-20 MED ORDER — ANTICOAGULANT SODIUM CITRATE 4% (200MG/5ML) IV SOLN: 5.0000 mL | Status: DC | PRN

## 2022-09-20 MED ORDER — PHENOL 1.4 % MT LIQD
1.0000 | OROMUCOSAL | Status: DC | PRN
  Administered 2022-09-20: 1 via OROMUCOSAL
  Filled 2022-09-20: qty 177

## 2022-09-20 MED ORDER — HEPARIN SODIUM (PORCINE) 1000 UNIT/ML DIALYSIS: 1000.0000 [IU] | INTRAMUSCULAR | Status: DC | PRN

## 2022-09-20 MED ORDER — LIDOCAINE-PRILOCAINE 2.5-2.5 % EX CREA: 1.0000 | TOPICAL_CREAM | CUTANEOUS | Status: DC | PRN

## 2022-09-20 MED ORDER — ORAL CARE MOUTH RINSE: 15.0000 mL | OROMUCOSAL | Status: DC | PRN

## 2022-09-20 MED ORDER — HEPARIN SODIUM (PORCINE) 1000 UNIT/ML DIALYSIS
3000.0000 [IU] | Freq: Once | INTRAMUSCULAR | Status: AC
  Administered 2022-09-20: 3000 [IU] via INTRAVENOUS_CENTRAL
  Filled 2022-09-20: qty 3

## 2022-09-20 MED ORDER — HEPARIN SODIUM (PORCINE) 1000 UNIT/ML DIALYSIS: 3000.0000 [IU] | INTRAMUSCULAR | Status: DC | PRN

## 2022-09-20 MED ORDER — LIDOCAINE HCL (PF) 1 % IJ SOLN: 5.0000 mL | INTRAMUSCULAR | Status: DC | PRN

## 2022-09-20 MED ORDER — ALTEPLASE 2 MG IJ SOLR
2.0000 mg | Freq: Once | INTRAMUSCULAR | Status: AC | PRN
  Administered 2022-09-20: 2 mg
  Filled 2022-09-20: qty 2

## 2022-09-20 MED ORDER — PENTAFLUOROPROP-TETRAFLUOROETH EX AERO: 1.0000 | INHALATION_SPRAY | CUTANEOUS | Status: DC | PRN

## 2022-09-20 NOTE — Progress Notes (Signed)
SLP Cancellation Note  Patient Details Name: Ryan [name] MRN: [id number] DOB: [birth year]   Cancelled treatment:       Reason Eval/Treat Not Completed: Patient at procedure or test/unavailable   Pat Thailyn Khalid,M.S., CCC-SLP 09/20/2022, 1:54 PM

## 2022-09-20 NOTE — Progress Notes (Signed)
FYI---caps added to bilateral ports---no flushing of any kind taken place

## 2022-09-20 NOTE — Progress Notes (Signed)
Attempt made to draw the TPA out of this pts bilateeral CVC ports--absolutely no blood returnat all---called and spoke with Eton Sink PA---updated report given---pt to be sent back to his room and she would make reservations for IR to be contacted for another line--report given to Joe RN (primary RN on the floor)--all questions answered

## 2022-09-20 NOTE — Progress Notes (Signed)
Progress Note   Patient: Ryan [name] [id number]:[id number] DOB: 12-Apr-1960 DOA: 09/07/2022     13 DOS: the patient was seen and examined on 09/20/2022   Brief hospital course: 62 y.o. male with a history of hypertension, hyperlipidemia, ESRD on hemodialysis.  Patient presented secondary to low back pain and was found to have evidence of L5-S1 discitis/osteomyelitis in addition to MSSA bacteremia.  ID consulted.  Workup significant for possible endocarditis. Patient also found to have a small bowel obstruction.   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   -Blood cultures positive for MSSA.   -ID consulted and pt is now on cefazolin IV with hemodialysis. Plan for 6 weeks of tx from 6/18  -Transthoracic echocardiogram significant for a large, rounded structure on the anterior leaflet of the mitral valve concerning for endocarditis. -Chest imaging was consistent with septic emboli -ID initially recommended  transesophageal echocardiogram, however TEE was deferred secondary to risk/benefit profile.  -CT surg was consulted and recommended non-surgical management of mitral valve endocarditis. Leukocytosis now improving -Report of slurred speech and some mentation changes reported by family. Brain MRI reviewed, findings concerning for septic emboli in bilateral hemispheres and R cerebellum. Reviewed with Neurology who agrees with septic emboli. Recommendation to cont abx with NIHSS scale -EEG neg for seizures, reviewed -This AM, mentation seems improved   ESRD on hemodialysis -Nephrology following   -Patient presented with a tunneled dialysis catheter which was removed on 6/18 secondary to bacteremia.   - Pt was s/p R femoral non-tunneled HD CVC 6/26, not functioning today. -IR planning tunneled cath on Monday -Cont HD per Nephrology    Intractable hiccups -will continue with thorazine as needed   Small bowel obstruction -Patient without a bowel movement for several days initially with SBO noted on abd CT with transition point -General surgery was consulted.  -symptoms later improved and diet was advanced -GI later signed off as of 6/25   Primary hypertension -Atenolol on hold secondary to soft BP -BP currently very well controlled   Elevated troponin -Previously noted. No associated chest pain. Presumed secondary to demand ischemia.   Lung nodules -Noted on initial CT scan.  Initial recommendation for repeat CT scan of the chest in 18 to 24 months.  Repeat CT scan on 6/20 showed multiple new pulmonary nodules compatible with likely active infection and/or aspiration.   -given known endocarditis, most likely septic emboli to lungs -most recent blood cx are neg x 5 days   Bladder wall thickening Noted incidentally on CT imaging.  No urinary symptoms and patient does not make urine secondary to ESRD. Unclear if this correlates with isolated infection or is related to overall infectious picture.      Subjective: Without complaints this AM. Did not complete HD today secondary to malfunctioning line  Physical Exam: Vitals:   09/20/22 0445 09/20/22 0805 09/20/22 1030 09/20/22 1059  BP: (!) 90/56 108/60 115/63 113/69  Pulse: 70 75 70 76  Resp: 18 18 (!) 25 18  Temp: 98.4 F (36.9 C) 98.3 F (36.8 C) 98.7 F (37.1 C) 98.1 F (36.7 C)  TempSrc: Axillary  Oral   SpO2: 95% 94% 92% 92%  Weight:      Height:       General exam: Conversant, in no acute distress Respiratory system: normal chest rise, clear, no audible wheezing Cardiovascular system: regular rhythm, s1-s2 Gastrointestinal system: Nondistended, nontender,  pos BS Central nervous system: No seizures, no tremors Extremities: No cyanosis, no joint deformities Skin: No rashes, no pallor Psychiatry: Affect normal // no auditory hallucinations   Data  Reviewed:  Labs reviewed: Na 128, K 4.7, Cr 11.38, WBC 22.5  Family Communication: Pt in room, family not at bedside  Disposition: Status is: Inpatient Remains inpatient appropriate because: Severity of illness  Planned Discharge Destination: Home     Author: Rickey Barbara, MD 09/20/2022 4:43 PM  For on call review [email or web address].

## 2022-09-20 NOTE — Progress Notes (Addendum)
[facility] Progress Note   Subjective: Issues with TDC prior to HD. Dwelling with cath flo prior to HD. No C/Os. Lying flat without C/O SOB.     Objective Vitals:   09/19/22 2002 09/20/22 0019 09/20/22 0445 09/20/22 0805  BP: 100/60 102/65 (!) 90/56 108/60  Pulse: 78 84 70 75  Resp: 18 18 18 18   Temp: 98.7 F (37.1 C) 98.2 F (36.8 C) 98.4 F (36.9 C) 98.3 F (36.8 C)  TempSrc: Oral Oral Axillary   SpO2: 97% (!) 89% 95% 94%  Weight:  81.2 kg    Height:       Physical Exam General: Pleasant, NAD Heart: S1,S2 No M/R/G SR  Lungs: CTAB Anteriorly. Abdomen: NABS, NT Extremities: No LE edema Dialysis Access: R femoral Temp catheter drsg intact  Additional Objective Labs: Basic Metabolic Panel: Recent Labs  Lab 09/15/22 0127 09/16/22 0338 09/17/22 0346 09/18/22 0234 09/19/22 0424 09/20/22 0250  NA 129* 127*   < > 128* 128* 128*  K 4.7 5.2*   < > 4.7 4.4 4.7  CL 91* 89*   < > 91* 90* 90*  CO2 19* 18*   < > 19* 23 22  GLUCOSE 110* 98   < > 111* 130* 115*  BUN 86* 103*   < > 83* 66* 87*  CREATININE 11.52* 13.26*   < > 11.23* 9.57* 11.38*  CALCIUM 7.8* 8.0*   < > 8.0* 7.8* 7.8*  PHOS 10.6* 11.7*  --  8.8*  --   --    < > = values in this interval not displayed.   Liver Function Tests: Recent Labs  Lab 09/18/22 0234 09/19/22 0424 09/20/22 0250  AST  --  41 52*  ALT  --  6 6  ALKPHOS  --  104 93  BILITOT  --  0.5 0.6  PROT  --  7.0 6.7  ALBUMIN 1.7* 1.6* 1.5*   No results for input(s): "LIPASE", "AMYLASE" in the last 168 hours. CBC: Recent Labs  Lab 09/16/22 0338 09/17/22 0346 09/18/22 0234 09/19/22 0424 09/20/22 0250  WBC 30.4* 26.2* 24.9* 27.2* 22.5*  HGB 8.9* 8.7* 8.9* 8.5* 7.8*  HCT 25.7* 25.2* 25.2* 24.9* 23.2*  MCV 79.8* 78.5* 79.7* 80.1 82.3  PLT 278 299 305 293 292   Blood Culture    Component Value Date/Time   SDES BLOOD RIGHT ANTECUBITAL 09/10/2022 1516   SDES BLOOD LEFT ARM 09/10/2022 1516   SPECREQUEST  09/10/2022  1516    BOTTLES DRAWN AEROBIC AND ANAEROBIC Blood Culture adequate volume   SPECREQUEST  09/10/2022 1516    BOTTLES DRAWN AEROBIC AND ANAEROBIC Blood Culture adequate volume   CULT  09/10/2022 1516    NO GROWTH 5 DAYS Performed at [facility], [address], [place], [place] [address]    CULT  09/10/2022 1516    NO GROWTH 5 DAYS Performed at [facility], [address] [place], [place] [address]    REPTSTATUS 09/15/2022 FINAL 09/10/2022 1516   REPTSTATUS 09/15/2022 FINAL 09/10/2022 1516    Cardiac Enzymes: No results for input(s): "CKTOTAL", "CKMB", "CKMBINDEX", "TROPONINI" in the last 168 hours. CBG: No results for input(s): "GLUCAP" in the last 168 hours. Iron Studies: No results for input(s): "IRON", "TIBC", "TRANSFERRIN", "FERRITIN" in the last 72 hours. @lablastinr3 @ Studies/Results: EEG adult  Result Date: 09/19/2022 Charlsie Quest, MD     09/19/2022  7:11 AM Patient Name: Ryan [name] MRN: [id number] Epilepsy Attending: Charlsie Quest Referring  Physician/Provider: Jerald Kief, MD Date: 09/18/2022 Duration: 24.29 mins Patient history: 62yo M with ams getting eeg to evaluate for seizure. Level of alertness: Awake AEDs during EEG study: None Technical aspects: This EEG study was done with scalp electrodes positioned according to the 10-20 International system of electrode placement. Electrical activity was reviewed with band pass filter of 1-70Hz , sensitivity of 7 uV/mm, display speed of 90mm/sec with a 60Hz  notched filter applied as appropriate. EEG data were recorded continuously and digitally stored.  Video monitoring was available and reviewed as appropriate. Description: The posterior dominant rhythm consists of 7Hz  activity of moderate voltage (25-35 uV) seen predominantly in posterior head regions, symmetric and reactive to eye opening and eye closing. EEG showed continuous generalized 3 to 6 Hz theta-delta slowing. Physiologic photic driving was not  seen during photic stimulation.  Hyperventilation was not performed.   ABNORMALITY - Continuous slow, generalized - Background slow IMPRESSION: This study is suggestive of moderate diffuse encephalopathy, nonspecific etiology. No seizures or epileptiform discharges were seen throughout the recording. Charlsie Quest   MR BRAIN WO CONTRAST  Result Date: 09/19/2022 CLINICAL DATA:  Initial evaluation for neuro deficit, stroke. EXAM: MRI HEAD WITHOUT CONTRAST TECHNIQUE: Multiplanar, multiecho pulse sequences of the brain and surrounding structures were obtained without intravenous contrast. COMPARISON:  CT from earlier the same day. FINDINGS: Brain: Examination technically limited by motion and patient's inability to tolerate the full length of the study. Cerebral volume within normal limits. No significant cerebral white matter disease for age. Scattered predominantly subcentimeter foci of restricted diffusion are seen involving the bilateral cerebral hemispheres and right cerebellum, consistent with small acute ischemic infarcts. For reference purposes, the largest infarct seen at the right cerebellum and measures 8 mm. Probable additional subtle evolving infarct noted at the right ventral pons (series 5, image 73). No associated hemorrhage or mass effect. No mass lesion, midline shift or mass effect. No hydrocephalus or extra-axial fluid collection. Vascular: Major intracranial vascular flow voids are maintained. Skull and upper cervical spine: Craniocervical junction grossly within normal limits. Bone marrow signal intensity grossly normal. No scalp soft tissue abnormality. Sinuses/Orbits: Globes orbital soft tissues within normal limits. Paranasal sinuses are largely clear. No significant mastoid effusion. Other: None. IMPRESSION: 1. Technically limited exam due to motion and the patient's inability to tolerate the full study. 2. Scattered subcentimeter acute ischemic infarcts involving the bilateral cerebral  hemispheres and right cerebellum. No associated hemorrhage or mass effect. Electronically Signed   By: Rise Mu M.D.   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 RADIATION DOSE REDUCTION: This exam was performed according to the departmental dose-optimization program which includes automated exposure control, adjustment of the mA and/or kV according to patient size and/or use of iterative reconstruction technique. COMPARISON:  None FINDINGS: Brain: No hemorrhage. No hydrocephalus. No extra-axial fluid collection. No mass effect. There is subcortical hypodensity along the superior frontal lobes bilaterally (series 7, image 27) which is nonspecific but favored to represent sequela of chroinc microvascular ischemic change Vascular: No hyperdense vessel or unexpected calcification. Skull: Normal. Negative for fracture or focal lesion. Sinuses/Orbits: No middle ear or mastoid effusion paranasal sinuses are clear orbits are unremarkable Other: None. IMPRESSION: No acute abnormality or definite finding  to suggest septic emboli on this non contrast enhanaced head CT. If there is high clinical concern for an infarct or intracranial infection, further evaluation with a contrast enhanaced brain MRI is recommended. Electronically Signed   By: Lorenza Cambridge M.D.   On: 09/18/2022 16:11   Medications:  anticoagulant sodium citrate      ceFAZolin (ANCEF) IV 2 g (09/18/22 1742)   methocarbamol (ROBAXIN) IV 500 mg (09/15/22 1854)     stroke: early stages of recovery book   Does not apply Once   (feeding supplement) PROSource Plus  30 mL Oral BID BM   atenolol  12.5 mg Oral Daily   calcitRIOL  0.5 mcg Oral Q T,Th,Sat-1800    Chlorhexidine Gluconate Cloth  6 each Topical Q0600   cinacalcet  30 mg Oral Q supper   darbepoetin (ARANESP) injection - DIALYSIS  60 mcg Subcutaneous Q Thu-1800   feeding supplement (NEPRO CARB STEADY)  237 mL Oral BID BM   multivitamin  1 tablet Oral QHS   pantoprazole  40 mg Oral Daily   sodium chloride flush  3 mL Intravenous Q12H   sucroferric oxyhydroxide  500 mg Oral TID WC     Dialysis Orders: [facility] TTS 3:45 450/A1.5x 2K/2Ca EDW 85.7kg TDC  -Heparin 3000 units IV TIW  -No ESA -Calcitriol 1.5 MCG PO TIW     Assessment/Plan: 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 ID following. Repeat blood cultures ordered-+ MSSA.  On cefazolin. TDC removed 6/18. Completed 72 hour line holiday. TTE concerning for endocarditis. TEE cancelled d/t high risk factors. Rpt blood cultures 6/19 -neg.  WBCs 28->31>>26 >>24. Temp cath placed 6/21. Appreciate ID input.  Continue cefazolin 2g q HD x 6 weeks (until 11/04/22).  Dialysis Access: non-tunneled fem HD cath in place.  Per IR notes unable to place RIJ [name] d/t occlusion of SVC that was incompletely evaluated d/t contrast allergy.  IR recommend consulting them for steroid prep prior to attempted L IJ TDC vs conversion of R fem to [facility]. Had temp cath exchanged 6/26 d/t poor function. 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 Improved. Surgery signed off.  ESRD -  HD TTS.  Unable to complete HD Tues d/t catheter dysfunction s/p temp cath exchange 6/26. Next HD 09/20/2022. Temp catheter not functioning-currently dwelling with cath flo.  Perm Access -  Had L AVF ligation in 2019. Last seen by Dr. Edilia Bo 01/2022 -limited mobility in R arm so planning for L arm graft but had not scheduled surgery yet.   Hypertension/volume  - BP actually on low side with SBP 90s. Very much under OP EDW if  weights are correct. Lower EDW on discharge Get standing wts .UF as tolerated.  Anemia  - Hb 7.8. Started ESA. No iron load with infection.   Metabolic bone disease -  Corr Ca acceptable. Phos 8.8. d/c'd sevelamer d/t SBO. Add Velphoro binder.  Nutrition - Renal diet when eating, currently on liquids diet. Add prot supp.  Dispo - possible CIR admission.Dene Gentry. Brown NP-C 09/20/2022, 9:27 AM  [facility] [phone]  Pt seen, examined and agree w assess/plan as above with additions as indicated. Unable to dialyze today due to poorly functioning temp HD cath. Spoke w/ IR, pt is stable and okay for access placement and next HD on Monday.  Pt needs a TDC and contrast allergy prep for the IR procedure.  Rob [facility] 09/20/2022, 11:41 AM

## 2022-09-20 NOTE — Progress Notes (Signed)
In an attempt to access this pts R femoral temporary hemodialysis cvc---unable to get any draw of blood even with multiple flushes---Rita PA made aware and ordeed TPA protocol---TPA instilled to bilateral lumens--2mg  in 2ml of sterile water reconstituted---will monitor  for  effectiveness

## 2022-09-21 ENCOUNTER — Encounter (HOSPITAL_COMMUNITY): Payer: Self-pay | Admitting: Family Medicine

## 2022-09-21 LAB — COMPREHENSIVE METABOLIC PANEL
ALT: 6 U/L (ref 0–44)
AST: 61 U/L — ABNORMAL HIGH (ref 15–41)
Albumin: 1.7 g/dL — ABNORMAL LOW (ref 3.5–5.0)
Alkaline Phosphatase: 97 U/L (ref 38–126)
Anion gap: 16 — ABNORMAL HIGH (ref 5–15)
BUN: 104 mg/dL — ABNORMAL HIGH (ref 8–23)
CO2: 22 mmol/L (ref 22–32)
Calcium: 7.9 mg/dL — ABNORMAL LOW (ref 8.9–10.3)
Chloride: 89 mmol/L — ABNORMAL LOW (ref 98–111)
Creatinine, Ser: 13.26 mg/dL — ABNORMAL HIGH (ref 0.61–1.24)
GFR, Estimated: 4 mL/min — ABNORMAL LOW (ref >60)
Glucose, Bld: 104 mg/dL — ABNORMAL HIGH (ref 70–99)
Potassium: 5.3 mmol/L — ABNORMAL HIGH (ref 3.5–5.1)
Sodium: 127 mmol/L — ABNORMAL LOW (ref 135–145)
Total Bilirubin: 0.6 mg/dL (ref 0.3–1.2)
Total Protein: 7.1 g/dL (ref 6.5–8.1)

## 2022-09-21 LAB — CBC
HCT: 23.6 % — ABNORMAL LOW (ref 39.0–52.0)
Hemoglobin: 8.2 g/dL — ABNORMAL LOW (ref 13.0–17.0)
MCH: 28.4 pg (ref 26.0–34.0)
MCHC: 34.7 g/dL (ref 30.0–36.0)
MCV: 81.7 fL (ref 80.0–100.0)
Platelets: 313 10*3/uL (ref 150–400)
RBC: 2.89 MIL/uL — ABNORMAL LOW (ref 4.22–5.81)
RDW: 15.6 % — ABNORMAL HIGH (ref 11.5–15.5)
WBC: 24.6 10*3/uL — ABNORMAL HIGH (ref 4.0–10.5)
nRBC: 0 % (ref 0.0–0.2)

## 2022-09-21 LAB — RENAL FUNCTION PANEL
Albumin: 1.7 g/dL — ABNORMAL LOW (ref 3.5–5.0)
Anion gap: 20 — ABNORMAL HIGH (ref 5–15)
BUN: 109 mg/dL — ABNORMAL HIGH (ref 8–23)
CO2: 20 mmol/L — ABNORMAL LOW (ref 22–32)
Calcium: 8 mg/dL — ABNORMAL LOW (ref 8.9–10.3)
Chloride: 86 mmol/L — ABNORMAL LOW (ref 98–111)
Creatinine, Ser: 13.01 mg/dL — ABNORMAL HIGH (ref 0.61–1.24)
GFR, Estimated: 4 mL/min — ABNORMAL LOW (ref >60)
Glucose, Bld: 134 mg/dL — ABNORMAL HIGH (ref 70–99)
Phosphorus: 9.2 mg/dL — ABNORMAL HIGH (ref 2.5–4.6)
Potassium: 5.4 mmol/L — ABNORMAL HIGH (ref 3.5–5.1)
Sodium: 126 mmol/L — ABNORMAL LOW (ref 135–145)

## 2022-09-21 MED ORDER — CEFAZOLIN SODIUM-DEXTROSE 2-4 GM/100ML-% IV SOLN: 2.0000 g | INTRAVENOUS | Status: DC

## 2022-09-21 MED ORDER — DIPHENHYDRAMINE HCL 25 MG PO CAPS
50.0000 mg | ORAL_CAPSULE | Freq: Once | ORAL | Status: AC
  Administered 2022-09-22: 50 mg via ORAL
  Filled 2022-09-21: qty 2

## 2022-09-21 MED ORDER — SODIUM ZIRCONIUM CYCLOSILICATE 10 G PO PACK: 10.0000 g | PACK | Freq: Once | ORAL | Status: DC

## 2022-09-21 MED ORDER — SODIUM ZIRCONIUM CYCLOSILICATE 10 G PO PACK
10.0000 g | PACK | Freq: Two times a day (BID) | ORAL | Status: AC
  Administered 2022-09-21: 10 g via ORAL
  Filled 2022-09-21 (×2): qty 1

## 2022-09-21 MED ORDER — SODIUM ZIRCONIUM CYCLOSILICATE 10 G PO PACK
10.0000 g | PACK | Freq: Two times a day (BID) | ORAL | Status: DC
  Administered 2022-09-21: 10 g via ORAL
  Filled 2022-09-21: qty 1

## 2022-09-21 MED ORDER — PREDNISONE 50 MG PO TABS
50.0000 mg | ORAL_TABLET | Freq: Four times a day (QID) | ORAL | Status: AC
  Administered 2022-09-22 (×3): 50 mg via ORAL
  Filled 2022-09-21 (×3): qty 1

## 2022-09-21 NOTE — Consult Note (Addendum)
Chief Complaint: Patient was seen in consultation today for [facility] placement  Chief Complaint  Patient presents with   Back Pain    [name], pt reports cough x 3 days, that is hurting back. Pt also reports diarrhea when coughing. Pt on dialysis , T.Thrus. Sat. Last dialysis was 09/06/22. Fever 102.5 in triage, pt reports lack of appetite and increased fatigue.   at the request of Delano Metz  Referring Physician(s):  Delano Metz  Supervising Physician: Roanna Banning  Patient Status: [facility] - In-pt  History of Present Illness: TIMTHY NORDMANN is a 62 y.o. male with PMHs of HTN, HLD, ESRD who is in need of long term HD access.   Patient is known to IR service for [facility] removal on 6/18, right femoral temp cath placement on 6/21, and exchange on 6/26.   Patient's right internal jugular TDC was removed on 6/18 due to bacteremia, R internal jugular temp cath placement was attempted but failed secondary to concern for SVC occlusion, contrast could not be administered due to patient's contrast allergy. Right femoral temp cath was placed, which required exchange on 6/26 due to malfunctioning.   Patient has cleared bacteremia, the right femoral temp cath has been suboptimally functioning for HD. IR was requested for image guided tunneled HD catheter placement, case reviewed and approved for possible venogram and image guided tunneled HD cath placement by Dr. Milford Cage. 13 hr contrast allergy prep will be required.   Patient seen in pt room.  Patient laying in bed, not in acute distress.  Denise headache, fever, chills, shortness of breath, cough, chest pain, abdominal pain, nausea ,vomiting, and bleeding.    Past Medical History:  Diagnosis Date   Allergy    Anemia    Blood transfusion without reported diagnosis    Dialysis patient (HCC)    Tues,thurs,sat   ESRD (end stage renal disease) (HCC)    TTHS-    Family history of adverse reaction to anesthesia    father had allergic  reaction with anesthesia with a dental procedure-(pt. doesn't know)   Hyperlipidemia    Hypertension     Past Surgical History:  Procedure Laterality Date   A/V FISTULAGRAM Left 12/15/2016   Procedure: A/V Fistulagram - left;  Surgeon: Chuck Hint, MD;  Location: [facility] INVASIVE CV LAB;  Service: Cardiovascular;  Laterality: Left;   AV FISTULA PLACEMENT Left 08/28/2016   Procedure: LEFT UPPER  ARM ARTERIOVENOUS (AV) FISTULA CREATION;  Surgeon: Chuck Hint, MD;  Location: [facility] OR;  Service: Vascular;  Laterality: Left;   DIALYSIS/PERMA CATHETER INSERTION Right 12/21/2017   Procedure: INSERTION OF DIALYSIS CATHETER Right Internal Jugular .;  Surgeon: Sherren Kerns, MD;  Location: [facility] OR;  Service: Vascular;  Laterality: Right;   FISTULA SUPERFICIALIZATION Left 09/23/2017   Procedure: FISTULA PLICATION LEFT ARM;  Surgeon: Sherren Kerns, MD;  Location: [facility] OR;  Service: Vascular;  Laterality: Left;   FISTULOGRAM Left 12/21/2017   Procedure: FISTULOGRAM with Balloon Angioplasty.;  Surgeon: Sherren Kerns, MD;  Location: [facility] OR;  Service: Vascular;  Laterality: Left;   HEMATOMA EVACUATION Left 08/27/2020   Procedure: EVACUATION HEMATOMA LEFT ARM;  Surgeon: Sherren Kerns, MD;  Location: [facility] OR;  Service: Vascular;  Laterality: Left;   IR FLUORO GUIDE CV LINE RIGHT  09/12/2022   IR FLUORO GUIDE CV LINE RIGHT  09/17/2022   IR REMOVAL TUN CV CATH W/O FL  09/09/2022   IR THORACENTESIS ASP PLEURAL SPACE W/IMG GUIDE  08/22/2017  1.2 L -right-sided   IR THORACENTESIS ASP PLEURAL SPACE W/IMG GUIDE  10/19/2017   IR US GUIDE VASC ACCESS RIGHT  09/12/2022   KIDNEY TRANSPLANT     REVISON OF ARTERIOVENOUS FISTULA Left 12/21/2017   Procedure: PLICATION and Ligation of F LEFT ARM ARTERIOVENOUS FISTULA;  Surgeon: Sherren Kerns, MD;  Location: [facility] OR;  Service: Vascular;  Laterality: Left;   REVISON OF ARTERIOVENOUS FISTULA Left 07/16/2020   Procedure: EXCISION OF LEFT ARM  ARTERIOVENOUS FISTULA;  Surgeon: Sherren Kerns, MD;  Location: [facility] OR;  Service: Vascular;  Laterality: Left;   stent in kidneys     dec 2017   TRANSTHORACIC ECHOCARDIOGRAM  09/22/2017    Severe LVH.  Normal function -EF 55-60%.  GRII DD.  Moderate RV dilation with mildly reduced RV function.   Fixed right coronary cusp with very mild aortic stenosis.  The myocardium has a speckled appearance --> .   Recommend cardiac MRI to evaluate for amyloid   UPPER EXTREMITY VENOGRAPHY Bilateral 06/01/2020   Procedure: UPPER EXTREMITY VENOGRAPHY;  Surgeon: Chuck Hint, MD;  Location: [facility] INVASIVE CV LAB;  Service: Cardiovascular;  Laterality: Bilateral;    Allergies: Azithromycin and Iodinated contrast media  Medications: Prior to Admission medications   Medication Sig Start Date End Date Taking? Authorizing Provider  atenolol (TENORMIN) 25 MG tablet Take 50 mg by mouth daily.   Yes [provider]  atorvastatin (LIPITOR) 40 MG tablet Take 40 mg by mouth daily.   Yes [provider]  cyclobenzaprine (FLEXERIL) 10 MG tablet Take 1 tablet (10 mg total) by mouth 2 (two) times daily as needed for muscle spasms. 01/15/22  Yes Carroll Sage, PA-C  EPINEPHrine 0.3 mg/0.3 mL IJ SOAJ injection Inject 0.3 mg into the muscle as needed for anaphylaxis. 06/08/21  Yes Redwine, Madison A, PA-C  loratadine (CLARITIN) 10 MG tablet Take 10 mg by mouth daily.   Yes [provider]  multivitamin (RENA-VIT) TABS tablet Take 1 tablet by mouth daily.   Yes [provider]  sevelamer carbonate (RENVELA) 800 MG tablet Take 1 tablet (800 mg total) by mouth 3 (three) times daily with meals. Patient taking differently: Take 800-1,600 mg by mouth See admin instructions. Take 2 tablets by mouth with each meal and 1 tablet with snacks 09/24/17  Yes Sheikh, Omair Latif, DO  tetrahydrozoline-zinc (VISINE-AC) 0.05-0.25 % ophthalmic solution Place 2 drops into both eyes 3 (three) times  daily as needed (dry eyes).   Yes [provider]  triamcinolone cream (KENALOG) 0.1 % Apply 1 application topically daily as needed (dry spots on face/excema).   Yes [provider]  ceFAZolin (ANCEF) IVPB Inject 2 g into the vein Every Tuesday,Thursday,and Saturday with dialysis. Indication:  MSSA discitis/osteo First Dose: Yes Last Day of Therapy:  11/04/22 Labs - Once weekly:  CBC/D and BMP, ESR and CRP Method of administration: Per HD protocol Method of administration may be changed at the discretion of home infusion pharmacist based upon assessment of the patient and/or caregiver's ability to self-administer the medication ordered. 09/16/22 11/04/22  Danelle Earthly, MD  Cetirizine HCl 10 MG CAPS Take 1 capsule (10 mg total) by mouth 1 day or 1 dose. Patient not taking: Reported on 09/08/2022 06/08/21 09/07/22  Redwine, Madison A, PA-C  Cholecalciferol (VITAMIN D3) 125 MCG (5000 UT) CAPS Take 5,000 Units by mouth daily. Patient not taking: Reported on 09/08/2022    [provider]  cinacalcet (SENSIPAR) 30 MG tablet Take 30 mg  by mouth at bedtime. Patient not taking: Reported on 09/08/2022 12/08/17   [provider]  Ferrous Sulfate (IRON) 325 (65 Fe) MG TABS Take 325 mg by mouth at bedtime. Patient not taking: Reported on 09/08/2022    [provider]  OVER THE COUNTER MEDICATION Take 1 tablet by mouth daily. Zinc and Copper Patient not taking: Reported on 09/08/2022    [provider]     Family History  Problem Relation Age of Onset   Hypertension Mother    Heart disease Mother 7       By his report, he thinks that she had heart attack.   Stroke Mother    Cancer Father    Kidney cancer Father    Hypertension Sister    Heart disease Maternal Grandmother    Alcohol abuse Maternal Grandfather    Mental illness Paternal Grandmother    Learning disabilities Paternal Grandmother        Alzheimer's    Stroke Paternal Grandfather    Colon  cancer Neg Hx    Colon polyps Neg Hx    Esophageal cancer Neg Hx    Rectal cancer Neg Hx    Stomach cancer Neg Hx     Social History   Socioeconomic History   Marital status: Divorced    Spouse name: Not on file   Number of children: Not on file   Years of education: Not on file   Highest education level: Not on file  Occupational History   Not on file  Tobacco Use   Smoking status: Never   Smokeless tobacco: Never  Vaping Use   Vaping Use: Never used  Substance and Sexual Activity   Alcohol use: No   Drug use: No   Sexual activity: Not Currently    Birth control/protection: None  Other Topics Concern   Not on file  Social History Narrative   Divorced   Does Mudlogger work   3 years of college.   Does not drink, does not smoke.  Does not use illicit drugs.   Son in nursing school at [employer]   Social Determinants of Health   Financial Resource Strain: Not on file  Food Insecurity: No Food Insecurity (09/11/2022)   Hunger Vital Sign    Worried About Running Out of Food in the Last Year: Never true    Ran Out of Food in the Last Year: Never true  Transportation Needs: No Transportation Needs (09/11/2022)   PRAPARE - Administrator, Civil Service (Medical): No    Lack of Transportation (Non-Medical): No  Physical Activity: Not on file  Stress: Not on file  Social Connections: Not on file     Review of Systems: A 12 point ROS discussed and pertinent positives are indicated in the HPI above.  All other systems are negative.  Vital Signs: BP 113/69 (BP Location: Right Wrist)   Pulse 78   Temp 97.6 F (36.4 C) (Oral)   Resp 18   Ht 5\' 9"  (1.753 m)   Wt 179 lb 0.2 oz (81.2 kg)   SpO2 (!) 87%   BMI 26.44 kg/m    Physical Exam Vitals reviewed.  Constitutional:      General: He is not in acute distress.    Appearance: He is not ill-appearing.  HENT:     Head: Normocephalic.     Mouth/Throat:     Mouth: Mucous membranes are moist.      Pharynx: Oropharynx is clear.  Cardiovascular:     Rate and Rhythm: Normal rate and regular rhythm.     Pulses: Normal pulses.     Heart sounds: Murmur heard.  Pulmonary:     Effort: Pulmonary effort is normal. No respiratory distress.     Breath sounds: Normal breath sounds. No wheezing, rhonchi or rales.  Abdominal:     General: Abdomen is flat. Bowel sounds are normal.     Palpations: Abdomen is soft.  Musculoskeletal:     Cervical back: Neck supple.  Skin:    General: Skin is warm and dry.     Coloration: Skin is not jaundiced or pale.  Neurological:     Mental Status: He is alert and oriented to person, place, and time.  Psychiatric:        Mood and Affect: Mood normal.        Behavior: Behavior normal.        Judgment: Judgment normal.     MD Evaluation Airway: WNL Heart: WNL Abdomen: WNL Chest/ Lungs: WNL ASA  Classification: 3 Mallampati/Airway Score: Two  Imaging: EEG adult  Result Date: 09/19/2022 Charlsie Quest, MD     09/19/2022  7:11 AM Patient Name: SHIVIN MCLARTY MRN: [id number] Epilepsy Attending: Charlsie Quest Referring Physician/Provider: Jerald Kief, MD Date: 09/18/2022 Duration: 24.29 mins Patient history: 62yo M with ams getting eeg to evaluate for seizure. Level of alertness: Awake AEDs during EEG study: None Technical aspects: This EEG study was done with scalp electrodes positioned according to the 10-20 International system of electrode placement. Electrical activity was reviewed with band pass filter of 1-70Hz , sensitivity of 7 uV/mm, display speed of 52mm/sec with a 60Hz  notched filter applied as appropriate. EEG data were recorded continuously and digitally stored.  Video monitoring was available and reviewed as appropriate. Description: The posterior dominant rhythm consists of 7Hz  activity of moderate voltage (25-35 uV) seen predominantly in posterior head regions, symmetric and reactive to eye opening and eye closing. EEG showed  continuous generalized 3 to 6 Hz theta-delta slowing. Physiologic photic driving was not seen during photic stimulation.  Hyperventilation was not performed.   ABNORMALITY - Continuous slow, generalized - Background slow IMPRESSION: This study is suggestive of moderate diffuse encephalopathy, nonspecific etiology. No seizures or epileptiform discharges were seen throughout the recording. Charlsie Quest   MR BRAIN WO CONTRAST  Result Date: 09/19/2022 CLINICAL DATA:  Initial evaluation for neuro deficit, stroke. EXAM: MRI HEAD WITHOUT CONTRAST TECHNIQUE: Multiplanar, multiecho pulse sequences of the brain and surrounding structures were obtained without intravenous contrast. COMPARISON:  CT from earlier the same day. FINDINGS: Brain: Examination technically limited by motion and patient's inability to tolerate the full length of the study. Cerebral volume within normal limits. No significant cerebral white matter disease for age. Scattered predominantly subcentimeter foci of restricted diffusion are seen involving the bilateral cerebral hemispheres and right cerebellum, consistent with small acute ischemic infarcts. For reference purposes, the largest infarct seen at the right cerebellum and measures 8 mm. Probable additional subtle evolving infarct noted at the right ventral pons (series 5, image 73). No associated hemorrhage or mass effect. No mass lesion, midline shift or mass effect. No hydrocephalus or extra-axial fluid collection. Vascular: Major intracranial vascular flow voids are maintained. Skull and upper cervical spine: Craniocervical junction grossly within normal limits. Bone marrow signal intensity grossly normal. No scalp soft tissue abnormality. Sinuses/Orbits: Globes orbital soft tissues within normal limits. Paranasal sinuses are largely clear. No significant mastoid effusion. Other: None.  IMPRESSION: 1. Technically limited exam due to motion and the patient's inability to tolerate the full  study. 2. Scattered subcentimeter acute ischemic infarcts involving the bilateral cerebral hemispheres and right cerebellum. No associated hemorrhage or mass effect. Electronically Signed   By: Rise Mu M.D.   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 RADIATION DOSE REDUCTION: This exam was performed according to the departmental dose-optimization program which includes automated exposure control, adjustment of the mA and/or kV according to patient size and/or use of iterative reconstruction technique. COMPARISON:  None FINDINGS: Brain: No hemorrhage. No hydrocephalus. No extra-axial fluid collection. No mass effect. There is subcortical hypodensity along the superior frontal lobes bilaterally (series 7, image 27) which is nonspecific but favored to represent sequela of chroinc microvascular ischemic change Vascular: No hyperdense vessel or unexpected calcification. Skull: Normal. Negative for fracture or focal lesion. Sinuses/Orbits: No middle ear or mastoid effusion paranasal sinuses are clear orbits are unremarkable Other: None. IMPRESSION: No acute abnormality or definite finding to suggest septic emboli on this non contrast enhanaced head CT. If there is high clinical concern for an infarct or intracranial infection, further evaluation with a contrast enhanaced brain MRI is recommended. Electronically Signed   By: Lorenza Cambridge M.D.   On: 09/18/2022 16:11   IR Fluoro Guide CV Line Right  Result Date: 09/17/2022 CLINICAL DATA:  Poor function of non tunneled right femoral hemodialysis catheter. Patient has contrast allergy and was not pre-medicated EXAM: EXCHANGE OF NONTUNNELED CENTRAL VENOUS CATHETER UNDER  FLUOROSCOPY FLUOROSCOPY: Radiation Exposure Index (as provided by the fluoroscopic device): 6 mGy air Kerma TECHNIQUE: After written informed consent was obtained, patient was placed in the supine position on angiographic table. right femoral venous catheter and surrounding skin prepped using maximum barrier technique including cap and mask, sterile gown, sterile gloves, large sterile sheet, and Chlorhexidine as cutaneous antisepsis. The region was infiltrated locally with 1% lidocaine. Fluoroscopic inspection showed stable position of the catheter compared to previous exam. Under fluoroscopic guidance, the catheter was exchanged over a 035 guidewire for a new 24 cm Trialysis catheter, position with the tip at the level of the L3-4 interspace. Both lumens flushed and aspirated easily. Catheter was flushed per protocol and secured externally. The patient tolerated procedure well. COMPLICATIONS: COMPLICATIONS none IMPRESSION: 1. Technically successful exchange of non tunneled right femoral hemodialysis catheter. If dysfunction persists, recommend pre-treating for contrast allergy before exchange to allow venography as needed. Electronically Signed   By: Corlis Leak M.D.   On: 09/17/2022 14:32   IR Fluoro Guide CV Line Right  Result Date: 09/12/2022 INDICATION: History of end-stage renal disease, now admitted with bacteremia, post removal of tunneled right internal jugular approach dialysis catheter on 09/09/2022. Unfortunately, patient has white blood cell count remains persistently elevated and as such she presents today for image guided placement of a temporary hemodialysis catheter to facilitate continued dialysis. Patient with history of contrast allergy though is not received a preprocedural steroid prep. EXAM: NON-TUNNELED CENTRAL VENOUS HEMODIALYSIS CATHETER PLACEMENT WITH ULTRASOUND AND FLUOROSCOPIC GUIDANCE COMPARISON:  Dialysis catheter removal-09/09/2022; chest radiograph-12/29/2017 MEDICATIONS: None  FLUOROSCOPY TIME:  3 minutes, 18 seconds (123 mGy) COMPLICATIONS: None immediate. PROCEDURE: Informed written consent was obtained from the patient after a discussion  of the risks, benefits, and alternatives to treatment. Questions regarding the procedure were encouraged and answered. The right neck and chest were prepped with chlorhexidine in a sterile fashion, and a sterile drape was applied covering the operative field. Maximum barrier sterile technique with sterile gowns and gloves were used for the procedure. A timeout was performed prior to the initiation of the procedure. After the overlying soft tissues were anesthetized, a small venotomy incision was created and a micropuncture kit was utilized to access the internal jugular vein. Real-time ultrasound guidance was utilized for vascular access including the acquisition of a permanent ultrasound image documenting patency of the accessed vessel. There was difficulty advancing the microwire through the SVC. As such, stiff glidewire was cannulated through the outer sheath of the micropuncture kit however again, could not be advanced to the SVC. Given patient's history of contrast allergy, intravenous contrast was not administered. As such, the decision was made to proceed with right common femoral vein approach temporary dialysis catheter placement to facilitate dialysis. The right groin was prepped and draped in usual sterile fashion. The right femoral head was marked fluoroscopically. Under direct ultrasound guidance, the right common femoral vein was accessed with a micropuncture kit after the overlying soft tissues were anesthetized with 1% lidocaine within epinephrine. An ultrasound image was saved documenting patency of the accessed vessel. Under fluoroscopic guidance, the track was dilated ultimately allowing placement of a The microwire was utilized to measure appropriate catheter length. A stiff glidewire was advanced to the level of the IVC. Under  fluoroscopic guidance, the venotomy was serially dilated, ultimately allowing placement of a 24 cm temporary Mah[place]kar dialysis catheter with tip ultimately terminating within the inferior aspect of the IVC. Final catheter positioning was confirmed and documented with a spot fluoroscopic image. The catheter aspirates and flushes normally. The catheter was flushed with appropriate volume heparin dwells. The catheter exit site was secured with a 0-Prolene retention suture. A dressing was placed. The patient tolerated the procedure well without immediate post procedural complication. IMPRESSION: 1. Attempted though ultimately unsuccessful placement of right internal jugular approach temporary hemodialysis catheter secondary to concern for SVC occlusion. Contrast was unable to be administered secondary to patient's history of contrast allergy and lack of preprocedural steroid prep. 2. Successful placement of a right common femoral vein approach 24 cm temporary dialysis catheter with tip terminating within the inferior aspect of the IVC. The catheter is ready for immediate use. PLAN: Recommend correlating with interventional radiology for once the patient's bacteremia has cleared, the patient will need a steroid prep prior to attempted left internal jugular approach hemodialysis catheter placement as this procedure will likely require the administration of contrast. Ultimately, if the patient's SVC is found to be occluded and unable to be traversed, the right common femoral vein dialysis catheter may be converted to a tunneled catheter as indicated. Electronically Signed   By: Simonne Come M.D.   On: 09/12/2022 16:16   IR US Guide Vasc Access Right  Result Date: 09/12/2022 INDICATION: History of end-stage renal disease, now admitted with bacteremia, post removal of tunneled right internal jugular approach dialysis catheter on 09/09/2022. Unfortunately, patient has white blood cell count remains persistently elevated  and as such she presents today for image guided placement of a temporary hemodialysis catheter to facilitate continued dialysis. Patient with history of contrast allergy though is not received a preprocedural steroid prep. EXAM: NON-TUNNELED CENTRAL VENOUS HEMODIALYSIS CATHETER PLACEMENT WITH ULTRASOUND AND FLUOROSCOPIC GUIDANCE COMPARISON:  Dialysis catheter removal-09/09/2022; chest  radiograph-12/29/2017 MEDICATIONS: None FLUOROSCOPY TIME:  3 minutes, 18 seconds (123 mGy) COMPLICATIONS: None immediate. PROCEDURE: Informed written consent was obtained from the patient after a discussion of the risks, benefits, and alternatives to treatment. Questions regarding the procedure were encouraged and answered. The right neck and chest were prepped with chlorhexidine in a sterile fashion, and a sterile drape was applied covering the operative field. Maximum barrier sterile technique with sterile gowns and gloves were used for the procedure. A timeout was performed prior to the initiation of the procedure. After the overlying soft tissues were anesthetized, a small venotomy incision was created and a micropuncture kit was utilized to access the internal jugular vein. Real-time ultrasound guidance was utilized for vascular access including the acquisition of a permanent ultrasound image documenting patency of the accessed vessel. There was difficulty advancing the microwire through the SVC. As such, stiff glidewire was cannulated through the outer sheath of the micropuncture kit however again, could not be advanced to the SVC. Given patient's history of contrast allergy, intravenous contrast was not administered. As such, the decision was made to proceed with right common femoral vein approach temporary dialysis catheter placement to facilitate dialysis. The right groin was prepped and draped in usual sterile fashion. The right femoral head was marked fluoroscopically. Under direct ultrasound guidance, the right common  femoral vein was accessed with a micropuncture kit after the overlying soft tissues were anesthetized with 1% lidocaine within epinephrine. An ultrasound image was saved documenting patency of the accessed vessel. Under fluoroscopic guidance, the track was dilated ultimately allowing placement of a The microwire was utilized to measure appropriate catheter length. A stiff glidewire was advanced to the level of the IVC. Under fluoroscopic guidance, the venotomy was serially dilated, ultimately allowing placement of a 24 cm temporary Mahurkar dialysis catheter with tip ultimately terminating within the inferior aspect of the IVC. Final catheter positioning was confirmed and documented with a spot fluoroscopic image. The catheter aspirates and flushes normally. The catheter was flushed with appropriate volume heparin dwells. The catheter exit site was secured with a 0-Prolene retention suture. A dressing was placed. The patient tolerated the procedure well without immediate post procedural complication. IMPRESSION: 1. Attempted though ultimately unsuccessful placement of right internal jugular approach temporary hemodialysis catheter secondary to concern for SVC occlusion. Contrast was unable to be administered secondary to patient's history of contrast allergy and lack of preprocedural steroid prep. 2. Successful placement of a right common femoral vein approach 24 cm temporary dialysis catheter with tip terminating within the inferior aspect of the IVC. The catheter is ready for immediate use. PLAN: Recommend correlating with interventional radiology for once the patient's bacteremia has cleared, the patient will need a steroid prep prior to attempted left internal jugular approach hemodialysis catheter placement as this procedure will likely require the administration of contrast. Ultimately, if the patient's SVC is found to be occluded and unable to be traversed, the right common femoral vein dialysis catheter may  be converted to a tunneled catheter as indicated. Electronically Signed   By: Simonne Come M.D.   On: 09/12/2022 16:16   DG Abd Portable 1V-Small Bowel Obstruction Protocol-initial, 8 hr delay  Result Date: 09/11/2022 CLINICAL DATA:  Evaluate small-bowel obstruction EXAM: PORTABLE ABDOMEN - 1 VIEW COMPARISON:  Film from earlier in the same day. FINDINGS: Multiple dilated loops of small bowel are again identified. Previously administered contrast lies within the stomach although has also passed distally into the colon consistent with a partial small bowel  obstruction. Right ureteral stent is noted. IMPRESSION: Persistent small bowel dilatation with contrast now seen in the colon consistent with partial small bowel obstruction. Electronically Signed   By: Alcide Clever M.D.   On: 09/11/2022 22:36   ABORTED INVASIVE LAB PROCEDURE  Result Date: 09/11/2022 This case was aborted.  DG Abd 1 View  Result Date: 09/11/2022 CLINICAL DATA:  Small-bowel obstruction EXAM: ABDOMEN - 1 VIEW COMPARISON:  KUB and CT abdomen/pelvis 1 day prior FINDINGS: Gaseous distention of small bowel in the midabdomen measuring up to 5.2 cm is not significantly changed. There is no definite free intraperitoneal air, within the confines of supine technique. There is no abnormal soft tissue calcification. A ureteral stent is noted in the right pelvis. There is no acute osseous abnormality. IMPRESSION: Similar gaseous distention of the small bowel compared to the study from 1 day prior. Electronically Signed   By: Lesia Hausen M.D.   On: 09/11/2022 08:19   CT ABDOMEN PELVIS WO CONTRAST  Result Date: 09/10/2022 CLINICAL DATA:  Bowel obstruction suspected EXAM: CT ABDOMEN AND PELVIS WITHOUT CONTRAST TECHNIQUE: Multidetector CT imaging of the abdomen and pelvis was performed following the standard protocol without IV contrast. RADIATION DOSE REDUCTION: This exam was performed according to the departmental dose-optimization program which  includes automated exposure control, adjustment of the mA and/or kV according to patient size and/or use of iterative reconstruction technique. COMPARISON:  Abdominal radiographs 09/10/2022 and CT chest abdomen and pelvis 09/07/2022 FINDINGS: Lower chest: Multiple new pulmonary nodules in the right lower lobe measuring up to 17 mm which were not present on 09/07/2022. Given rapid development these are infectious/inflammatory. The previously seen 6 mm nodule in the left lower lobe has resolved. Hepatobiliary: Mild hepatic steatosis. Decompressed gallbladder. No biliary dilation. Pancreas: Unremarkable. Spleen: Unremarkable. Adrenals/Urinary Tract: Normal adrenal glands. Atrophic native kidneys. No urinary calculi or hydronephrosis. 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 A ureteral stent again extends from the transplant kidney into the bladder. Irregular bladder wall thickening. Stomach/Bowel: Diffuse dilation of the small bowel in the anterior abdomen and pelvis there is abrupt transition point in the right anterior abdomen (series 6/image 37 and series 3/image 55) in an area of mild wall thickening. Enteric contrast is present within the small bowel reaching a point in the left lower quadrant where there is moderate dilation and wall thickening of the small bowel (circa series 3/image 64). Stomach is within normal limits. Normal caliber colon. Colonic diverticulosis without diverticulitis. Normal appendix. Vascular/Lymphatic: Aortic atherosclerosis. No enlarged abdominal or pelvic lymph nodes. Reproductive: Unremarkable. Other: No free intraperitoneal fluid or air. Fat containing left inguinal hernia. Musculoskeletal: No acute osseous abnormality or destructive osseous lesion. Dense bones consistent with renal osteodystrophy. Lucency in the L2 vertebral body are stable since 2020 consistent with Schmorl's nodes or changes of hyperparathyroidism. IMPRESSION: 1.  Small-bowel obstruction with transition point in the right anterior abdomen in an area of mild wall thickening. 2. Enteric contrast is present within the small bowel reaching a point in the left lower quadrant where there is moderate dilation and apparent wall thickening. Wall thickening may be due to infectious or inflammatory enteritis however neoplastic infiltration is not excluded. Continued attention on follow-up. 3. Multiple new pulmonary nodules in the right lower lobe measuring up to 17 mm which were not present on 09/07/2022. Given rapid development these are compatible with infection and/or aspiration. 4. Irregular bladder wall thickening. Recommend correlation with urinalysis. Aortic Atherosclerosis (ICD10-I70.0). Electronically Signed  By: Minerva Fester M.D.   On: 09/10/2022 19:56   DG Abd Portable 1V  Result Date: 09/10/2022 CLINICAL DATA:  Abdominal distension. EXAM: PORTABLE ABDOMEN - 1 VIEW COMPARISON:  August 26, 2016. FINDINGS: Small bowel dilatation is noted concerning for distal small bowel obstruction. No colonic dilatation is noted. Right-sided ureteral stent is again noted. IMPRESSION: Small bowel dilatation is noted concerning for distal small bowel obstruction or possibly ileus. Electronically Signed   By: Lupita Raider M.D.   On: 09/10/2022 14:26   IR Removal Tun Cv Cath W/O FL  Result Date: 09/09/2022 INDICATION: 62 year old male with history of ESRD and tunneled hemodialysis catheter in place presents with bacteremia. Request for tunneled dialysis catheter removal. EXAM: REMOVAL OF TUNNELED HEMODIALYSIS CATHETER MEDICATIONS: 10 mL 1 % lidocaine COMPLICATIONS: None immediate. PROCEDURE: Informed written consent was obtained from the patient following an explanation of the procedure, risks, benefits and alternatives to treatment. A time out was performed prior to the initiation of the procedure. Sterile technique was utilized including mask, sterile gloves, sterile drape, and hand  hygiene. ChloraPrep was used to prep the patient's right neck, chest and existing catheter. 1% lidocaine was injected around the catheter and the subcutaneous tunnel. The catheter was dissected out using scissors and curved hemostats until the cuff was freed from the surrounding fibrous sheath. The catheter was removed intact. Hemostasis was obtained with manual compression. A dressing was placed. The patient tolerated the procedure well without immediate post procedural complication. IMPRESSION: Successful removal of tunneled dialysis catheter. Performed by: Lawernce Ion, PA-C Electronically Signed   By: Irish Lack M.D.   On: 09/09/2022 13:32   ECHOCARDIOGRAM COMPLETE BUBBLE STUDY  Result Date: 09/08/2022    ECHOCARDIOGRAM REPORT   Patient Name:   NIKITA KOPE Date of Exam: 09/08/2022 Medical Rec #:  [id number]           Height:       69.0 in Accession #:    [id number]          Weight:       185.0 lb Date of Birth:  10/23/1960            BSA:          1.999 m Patient Age:    62 years            BP:           145/84 mmHg Patient Gender: M                   HR:           74 bpm. Exam Location:  Inpatient Procedure: 2D Echo, Cardiac Doppler, Color Doppler and Saline Contrast Bubble            Study Indications:    I50.32 Chronic diastolic heart failure  History:        Patient has prior history of Echocardiogram examinations, most                 recent 08/06/2022. Risk Factors:Hypertension and Dyslipidemia.  Sonographer:    Dondra Prader RVT RCS Referring Phys: [id number] [facility]  Sonographer Comments: Technically difficult study due to poor echo windows. Patient had trouble positioning for exam and having back spasms. IMPRESSIONS  1. Left ventricular ejection fraction, by estimation, is 55 to 60%. The left ventricle has normal function. The left ventricle has no regional wall motion abnormalities. There is moderate concentric left ventricular hypertrophy. Left ventricular diastolic parameters  are  consistent with Grade I diastolic dysfunction (impaired relaxation).  2. Right ventricular systolic function is normal. The right ventricular size is normal.  3. Left atrial size was mildly dilated.  4. Bubble study performed per tech's notes but I do not see any bubbles appear on the study. Consider repeat study, if clinically indicated (can do during TEE if needed).  5. There is a large, rounded structure on the anterior leflet of the mitral valve tht is partially obstructing mitral inflow with a ball-valve type motion. This was not present on echo in 5/24. Concern for endocarditis. Suggest TEE to more formally evalaute. The mitral valve is normal in structure. No evidence of mitral valve regurgitation. No evidence of mitral stenosis.  6. The right coronary cusp of the aortic valve appears fused. The aortic valve is normal in structure. There is moderate calcification of the aortic valve. Aortic valve regurgitation is mild. Mild aortic valve stenosis. Aortic regurgitation PHT measures  526 msec. Aortic valve area, by VTI measures 1.90 cm. Aortic valve mean gradient measures 10.0 mmHg. Aortic valve Vmax measures 2.17 m/s.  7. Aortic dilatation noted. There is mild dilatation of the ascending aorta, measuring 39 mm.  8. The inferior vena cava is normal in size with greater than 50% respiratory variability, suggesting right atrial pressure of 3 mmHg. Conclusion(s)/Recommendation(s): There is a large, rounded structure on the anterior leflet of the mitral valve tht is partially obstructing mitral inflow with a ball-valve type motion. This was not present on echo in 5/24. Concern for endocarditis. Suggest TEE to more formally evalaute. FINDINGS  Left Ventricle: Left ventricular ejection fraction, by estimation, is 55 to 60%. The left ventricle has normal function. The left ventricle has no regional wall motion abnormalities. The left ventricular internal cavity size was normal in size. There is  moderate concentric  left ventricular hypertrophy. Left ventricular diastolic parameters are consistent with Grade I diastolic dysfunction (impaired relaxation). Right Ventricle: The right ventricular size is normal. No increase in right ventricular wall thickness. Right ventricular systolic function is normal. Left Atrium: Left atrial size was mildly dilated. Right Atrium: Right atrial size was normal in size. Pericardium: There is no evidence of pericardial effusion. Mitral Valve: There is a large, rounded structure on the anterior leflet of the mitral valve tht is partially obstructing mitral inflow with a ball-valve type motion. This was not present on echo in 5/24. Concern for endocarditis. Suggest TEE to more formally evalaute. The mitral valve is normal in structure. No evidence of mitral valve regurgitation. No evidence of mitral valve stenosis. Tricuspid Valve: The tricuspid valve is normal in structure. Tricuspid valve regurgitation is not demonstrated. No evidence of tricuspid stenosis. Aortic Valve: The right coronary cusp of the aortic valve appears fused. The aortic valve is normal in structure. There is moderate calcification of the aortic valve. Aortic valve regurgitation is mild. Aortic regurgitation PHT measures 526 msec. Mild aortic stenosis is present. Aortic valve mean gradient measures 10.0 mmHg. Aortic valve peak gradient measures 18.9 mmHg. Aortic valve area, by VTI measures 1.90 cm. Pulmonic Valve: The pulmonic valve was normal in structure. Pulmonic valve regurgitation is trivial. No evidence of pulmonic stenosis. Aorta: Aortic dilatation noted. There is mild dilatation of the ascending aorta, measuring 39 mm. Venous: The inferior vena cava is normal in size with greater than 50% respiratory variability, suggesting right atrial pressure of 3 mmHg. IAS/Shunts: No atrial level shunt detected by color flow Doppler. Agitated saline contrast was given intravenously to evaluate  for intracardiac shunting.  LEFT  VENTRICLE PLAX 2D LVIDd:         4.60 cm   Diastology LVIDs:         3.00 cm   LV e' medial:    5.61 cm/s LV PW:         1.20 cm   LV E/e' medial:  21.0 LV IVS:        1.40 cm   LV e' lateral:   6.66 cm/s LVOT diam:     1.80 cm   LV E/e' lateral: 17.7 LV SV:         78 LV SV Index:   39 LVOT Area:     2.54 cm  RIGHT VENTRICLE            IVC RV S prime:     9.64 cm/s  IVC diam: 1.10 cm TAPSE (M-mode): 2.0 cm LEFT ATRIUM             Index LA diam:        3.60 cm 1.80 cm/m LA Vol (A2C):   70.2 ml 35.12 ml/m LA Vol (A4C):   49.4 ml 24.69 ml/m LA Biplane Vol: 61.6 ml 30.82 ml/m  AORTIC VALVE                     PULMONIC VALVE AV Area (Vmax):    1.88 cm      PV Vmax:       0.88 m/s AV Area (Vmean):   1.89 cm      PV Peak grad:  3.1 mmHg AV Area (VTI):     1.90 cm AV Vmax:           217.50 cm/s AV Vmean:          147.000 cm/s AV VTI:            0.411 m AV Peak Grad:      18.9 mmHg AV Mean Grad:      10.0 mmHg LVOT Vmax:         161.00 cm/s LVOT Vmean:        109.000 cm/s LVOT VTI:          0.307 m LVOT/AV VTI ratio: 0.75 AI PHT:            526 msec  AORTA Ao Root diam: 3.70 cm Ao Asc diam:  3.90 cm MITRAL VALVE MV Area (PHT): 3.08 cm     SHUNTS MV Decel Time: 246 msec     Systemic VTI:  0.31 m MV E velocity: 118.00 cm/s  Systemic Diam: 1.80 cm MV A velocity: 126.00 cm/s MV E/A ratio:  0.94 Arvilla Meres MD Electronically signed by Arvilla Meres MD Signature Date/Time: 09/08/2022/4:37:49 PM    Final    MR LUMBAR SPINE WO CONTRAST  Result Date: 09/07/2022 CLINICAL DATA:  62 year old male with fever of unknown origin. Dialysis patient. Cough, diarrhea. Back pain. EXAM: MRI LUMBAR SPINE WITHOUT CONTRAST TECHNIQUE: Multiplanar, multisequence MR imaging of the lumbar spine was performed. No intravenous contrast was administered. COMPARISON:  Thoracic MRI today reported separately, CT Chest, Abdomen, and Pelvis and CT lumbar spine 0339 hours today. FINDINGS: Segmentation:  Normal, concordant with the thoracic  numbering today. Alignment: Stable from the CT earlier today. Maintained lumbar lordosis. No significant scoliosis or spondylolisthesis. Vertebrae: Background heterogeneous lumbosacral and pelvic marrow signal as seen in the thoracic MRI, compatible with renal osteodystrophy demonstrated by CT. Intermittent mild and degenerative appearing endplate marrow edema  from T12 through the lumbosacral junction. Such marrow edema is most pronounced at L5-S1, and there is increased STIR signal in the disc there. But no paraspinal inflammation there. See additional details of that level below. Intact visible sacrum and SI joints. Conus medullaris and cauda equina: Conus extends to the T12-L1 level. No lower spinal cord or conus signal abnormality. Fairly capacious spinal canal throughout and normal cauda equina nerve roots. Paraspinal and other soft tissues: Stable to the CT Abdomen and Pelvis this morning. No convincing paraspinal soft tissue inflammation. Disc levels: Normal for age above L5-S1. L5-S1: Disc space loss and heterogeneity, some increased central disc STIR signal. No vacuum disc by CT this morning. Mild endplate irregularity by CT there. Circumferential disc osteophyte complex, broad-based posterior component affecting the ventral epidural fat, with mild to moderate lateral recess stenosis greater on the right. No spinal stenosis. Mild to moderate bilateral degenerative foraminal stenosis. IMPRESSION: 1. Background renal osteodystrophy. Intermittent mild degenerative appearing endplate marrow edema as seen in the thoracic spine today. However, more indeterminate changes at the L5-S1 level. Difficult to exclude Early Discitis Osteomyelitis there, although no paraspinal inflammation at this time. Recommend blood cultures, and if negative a repeat Lumbar MRI (without and with contrast preferred, but noncontrast probably would suffice) in 7-14 days. 2. Disc and endplate degeneration at L5-S1 with up to moderate  involvement of the exiting L5 and descending S1 nerves. No spinal stenosis. And age-appropriate lumbar spine degeneration otherwise. Electronically Signed   By: Odessa Fleming M.D.   On: 09/07/2022 08:47   MR THORACIC SPINE WO CONTRAST  Result Date: 09/07/2022 CLINICAL DATA:  62 year old male with fever of unknown origin. Dialysis patient. Cough, diarrhea. Back pain. EXAM: MRI THORACIC SPINE WITHOUT CONTRAST TECHNIQUE: Multiplanar, multisequence MR imaging of the thoracic spine was performed. No intravenous contrast was administered. COMPARISON:  CT Chest, Abdomen, and Pelvis 0339 hours today. FINDINGS: Limited cervical spine imaging: Some disc and endplate degeneration. Mildly heterogeneous bone marrow signal similar to that in the thoracic spine. Thoracic spine segmentation:  Appears to be normal. Alignment: Mildly exaggerated thoracic kyphosis. No spondylolisthesis or significant scoliosis. Vertebrae: Generalized heterogeneous marrow signal, likely corresponding to CT evidence of diffuse renal osteodystrophy earlier today. Mostly vague decreased T1 and T2 marrow signal. Superimposed degenerative appearing endplate changes in the thoracic spine T5 through T10 with multilevel mild focal endplate marrow edema there. No suspicious marrow edema or suspicious marrow lesion. Grossly negative visible posterior ribs. Cord: Negative. Capacious thoracic spinal canal. Conus medullaris appears normal at T12-L1. Paraspinal and other soft tissues: Stable from CT Chest, Abdomen, and Pelvis today. Negative thoracic paraspinal soft tissues. Disc levels: Widespread thoracic disc and endplate degeneration, but no significant thoracic disc herniation. No thoracic spinal stenosis. And only occasional thoracic neural foraminal stenosis (T10-T11 mild-to-moderate T10 foraminal stenosis in part due to facet hypertrophy. IMPRESSION: Background marrow signal changes likely relating to diffuse renal osteodystrophy seen by CT today. Superimposed  thoracic disc and endplate degeneration, including multilevel mild degenerative appearing endplate marrow edema in the thoracic spine. But no findings suspicious for discitis osteomyelitis at this time. No thoracic spinal stenosis. Electronically Signed   By: Odessa Fleming M.D.   On: 09/07/2022 08:39   CT L-SPINE NO CHARGE  Result Date: 09/07/2022 CLINICAL DATA:  Fever unknown origin.  Back pain. EXAM: CT LUMBAR SPINE WITHOUT CONTRAST TECHNIQUE: Multidetector CT imaging of the lumbar spine was performed without intravenous contrast administration. Multiplanar CT image reconstructions were also generated. RADIATION DOSE REDUCTION: This exam  was performed according to the departmental dose-optimization program which includes automated exposure control, adjustment of the mA and/or kV according to patient size and/or use of iterative reconstruction technique. COMPARISON:  CT abdomen and pelvis with IV contrast 03/09/2019 FINDINGS: Segmentation: 5 lumbar type vertebrae. Alignment: Within normal limits. Vertebrae: There have been no significant interval changes. The bones are again noted dense consistent with renal osteodystrophy. There are prominent lucencies in the superior and inferior endplates of L2 which were noted previously and could be large Schmorl's nodes or changes of hyperparathyroidism. There are additional slight central endplate depressions L4 and 5 which were noted previously, with discogenic endplate irregularity opposite of L5-S1. No fracture or other acute abnormality is seen. No destructive or aggressive bone lesion. Paraspinal and other soft tissues: Aortoiliac atherosclerosis. Atrophic native kidneys. No acute findings. Disc levels: There are normal disc heights except for moderate disc space loss once again T11-12 and T12-L1. There are no herniated discs or significant disc bulges from T12-L1 through L2-3. At L3-4, there is mild diffuse nonstenosing annular bulge which was seen previously. There is  mild foraminal narrowing due to inferior foraminal disc bulging. No herniation or significant canal encroachment. At L4-5, there is a mild diffuse annular bulge without herniation or canal zone stenosis. The foramina are clear. There is only slight facet spurring. At L5-S1, there is a broad-based posterior disc bulge abutting both S1 nerve roots without displacement or compression, herniation or stenosis. Due to inferior foraminal disc bulging and facet spurring there is moderate bilateral foraminal stenosis. The SI joints are patent with mild spurring. No erosive arthropathy is seen. IMPRESSION: 1. No acute osseous abnormalities.  No appreciable interval changes. 2. Lumbar degenerative changes and findings of renal osteodystrophy. 3. Prominent lucencies in the superior and inferior endplates of L2 which could be large Schmorl's nodes or changes of hyperparathyroidism. Similar findings in 2020. 4. Aortic atherosclerosis. 5. Atrophic native kidneys. Aortic Atherosclerosis (ICD10-I70.0). Electronically Signed   By: Almira Bar M.D.   On: 09/07/2022 04:47   CT CHEST ABDOMEN PELVIS WO CONTRAST  Result Date: 09/07/2022 CLINICAL DATA:  Fever of unknown origin. EXAM: CT CHEST, ABDOMEN AND PELVIS WITHOUT CONTRAST TECHNIQUE: Multidetector CT imaging of the chest, abdomen and pelvis was performed following the standard protocol without IV contrast. RADIATION DOSE REDUCTION: This exam was performed according to the departmental dose-optimization program which includes automated exposure control, adjustment of the mA and/or kV according to patient size and/or use of iterative reconstruction technique. COMPARISON:  Portable chest today, PA Lat chest 12/29/2017. Chest CT with contrast 12/25/2017, abdomen and pelvis CT with contrast 03/09/2019, abdomen and pelvis CT no contrast 07/07/2017. FINDINGS: CT CHEST FINDINGS Cardiovascular: The cardiac size is normal, was previously enlarged on all prior CTs. The pulmonary trunk  remains prominent at 3.3 cm indicating arterial hypertension. No venous distention is seen. There is a dialysis catheter via right IJ approach, the tip in the upper right atrium. There are moderate calcifications of the aortic valve leaflets, mild-to-moderate calcifications in the thoracic aorta without aneurysm. Scattered calcific plaque in the great vessels with normal great vessel branching. Mediastinum/Nodes: Numerous tiny lymph nodes are scattered throughout the mediastinum. These are less prominent than in 2019. Largest is 1.1 cm in short axis in the left prevascular space and similar to the prior study. Others are not enlarged and most of them are less than 5 mm. There is no esophageal thickening, tracheal or main bronchus filling defect. Thyroid gland and axillary spaces are unremarkable. Lungs/Pleura:  Lung bases show scattered subsegmental level. There is new demonstration of a posterior basal left lower lobe 6 mm noncalcified nodule on 6:110. There is new demonstration of a 3 mm noncalcified right upper lobe nodule on 6:47. The remainder of the bilateral lungs are clear. No focal pneumonia is evident. Musculoskeletal: Bilateral subareolar gynecomastia appears similar. There are multilevel endplate Schmorl's nodes. Mild thoracic kyphosis. There is thoracic spondylosis. No aggressive osseous lesion. CT ABDOMEN PELVIS FINDINGS Hepatobiliary: The liver is 18.6 cm length and mildly steatotic. No mass is seen without contrast. The gallbladder and bile ducts are unremarkable. Pancreas: Unremarkable without contrast. Spleen: Unremarkable without contrast. Adrenals/Urinary Tract: There is no adrenal mass. The native kidneys are atrophic. There are multiple small cysts and additional too small to characterize subcentimeter hypodensities. No specific follow-up recommendation is made. There are no intrarenal stones in the native kidneys no hydronephrosis. 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 Findings consistent with chronic transplant rejection. Ureteral stent again extends from this transplant into the bladder. There is diffuse irregular bladder thickening which was seen on prior studies as well with perivesical stranding. Correlate clinically for chronic cystitis. Stomach/Bowel: The stomach, duodenum and proximal jejunum are unremarkable but there are dilated segments of distal jejunum and ileum with operative at adjuvant up to 3 cm, or decompression of the remainder. A transitional segment could not be found. Findings are concerning for a low-grade approximately mid ileal small-bowel obstruction potential due to occult adhesions or occult internal hernia. The appendix is normal caliber. There is diffuse colonic diverticulosis without evidence of diverticulitis. Vascular/Lymphatic: Aortic atherosclerosis. No enlarged abdominal or pelvic lymph nodes. Reproductive: Borderline prostate size 4.4 cm transverse. Both testicles are in the scrotal sac. Other: Small umbilical and left inguinal fat hernias. No incarcerated hernia. There is no free fluid, free hemorrhage, or free air. Musculoskeletal: Dense bones consistent with renal osteodystrophy. Lucencies in the L2 vertebral body are stable since 2020 consistent with prominent Schmorl's nodes or changes of hyperparathyroidism. There is no aggressive bone lesion regional skeletal fracture. IMPRESSION: 1. No focal pneumonia is seen. 2. 6 mm left lower lobe and 3 mm right upper lobe noncalcified nodules. Per Fleischner Society Guidelines, recommend a non-contrast Chest CT at 3-6 months, then consider another non-contrast Chest CT at 18-24 months. If patient is low risk for malignancy, non-contrast Chest CT at 18-24 months is optional. These guidelines do not apply to immunocompromised patients and patients with cancer. Follow up in patients with significant  comorbidities as clinically warranted. For lung cancer screening, adhere to Lung-RADS guidelines. Reference: Radiology. 2017; 284(1):228-43. 3. Aortic and coronary artery atherosclerosis. 4. Aortic valve leaflet calcifications. Echocardiography may be helpful to assess for valvular dysfunction. 5. Chronic prominence of the pulmonary trunk indicating arterial hypertension. 6. Numerous tiny lymph nodes in the mediastinum, less prominent than in 2019. 7. Atrophic native kidneys with increasingly calcified and atrophic right iliac fossa renal transplant. Ureteral stent again extends from this transplant into the bladder. 8. Mildly dilated segments of distal jejunum and ileum with decompression of the remainder of the small bowel. Findings concerning for a low-grade approximately mid ileal small-bowel obstruction potentially due to occult adhesions or occult internal hernia. 9. Diffuse colonic diverticulosis without evidence of diverticulitis. 10. Likely cystitis. No other findings to explain the patient's fever. 11. Renal osteodystrophy. 12. Umbilical and left inguinal fat hernias. Aortic Atherosclerosis (ICD10-I70.0). Electronically Signed   By: Almira Bar  M.D.   On: 09/07/2022 04:33   DG Chest Portable 1 View  Result Date: 09/07/2022 CLINICAL DATA:  Cough and fevers EXAM: PORTABLE CHEST 1 VIEW COMPARISON:  12/29/2017 FINDINGS: Cardiac shadow is enlarged but stable. Dialysis catheter is noted in satisfactory position. Lungs are well aerated bilaterally. No focal infiltrate or effusion is seen. No bony abnormality is noted. IMPRESSION: No acute abnormality seen. Electronically Signed   By: Alcide Clever M.D.   On: 09/07/2022 02:42    Labs:  CBC: Recent Labs    09/18/22 0234 09/19/22 0424 09/20/22 0250 09/21/22 0513  WBC 24.9* 27.2* 22.5* 24.6*  HGB 8.9* 8.5* 7.8* 8.2*  HCT 25.2* 24.9* 23.2* 23.6*  PLT 305 293 292 313    COAGS: Recent Labs    09/10/22 1912  INR 1.4*    BMP: Recent Labs     09/18/22 0234 09/19/22 0424 09/20/22 0250 09/21/22 0513  NA 128* 128* 128* 127*  K 4.7 4.4 4.7 5.3*  CL 91* 90* 90* 89*  CO2 19* 23 22 22   GLUCOSE 111* 130* 115* 104*  BUN 83* 66* 87* 104*  CALCIUM 8.0* 7.8* 7.8* 7.9*  CREATININE 11.23* 9.57* 11.38* 13.26*  GFRNONAA 5* 6* 5* 4*    LIVER FUNCTION TESTS: Recent Labs    09/08/22 1128 09/12/22 1756 09/18/22 0234 09/19/22 0424 09/20/22 0250 09/21/22 0513  BILITOT 0.9  --   --  0.5 0.6 0.6  AST 82*  --   --  41 52* 61*  ALT 58*  --   --  6 6 6   ALKPHOS 106  --   --  104 93 97  PROT 8.0  --   --  7.0 6.7 7.1  ALBUMIN 2.7*   < > 1.7* 1.6* 1.5* 1.7*   < > = values in this interval not displayed.    TUMOR MARKERS: No results for input(s): "AFPTM", "CEA", "CA199", "CHROMGRNA" in the last 8760 hours.  Assessment and Plan: 62 y.o. male with ESRD who is in need of long term HD access.   VSS - has been afebrile for several days  CBC with persistent leukocytosis, there is no concern for acute infection per primary team Dr. Rhona Leavens.  Repeat 2 blood cx from 6/19 showed NG x 5 days  Pt on long term abx per ID, gets ancef during HD TThSa Not on AC/AP Allergic to contrast - 13 hr prep to start at [place] for procedure time at 1 pm tomorrow.   Risks and benefits discussed with the patient including, but not limited to bleeding, infection, vascular injury, pneumothorax which may require chest tube placement, air embolism or even death  All of the patient's questions were answered, patient is agreeable to proceed. Consent signed and in chart.  The procedure is scheduled for Monday 1 pm.   PLAN - NPO at Monday 7 am  - Ancef 2 g will be given in IR during the procedure  - 13 hr allergy prep ordered 50 mg prednisone at 0000, 0600, and 1200 hrs, 50 mg PO benadryl at 1200 hrs.     Thank you for this interesting consult.  I greatly enjoyed meeting RUY DUNKELBERGER and look forward to participating in their care.  A copy of this report was  sent to the requesting provider on this date.  Electronically Signed: Willette Brace, PA-C 09/21/2022, 10:21 AM   I spent a total of 40 Minutes    in face to face in clinical consultation, greater than 50%  of which was counseling/coordinating care for [facility] placement.   This chart was dictated using voice recognition software.  Despite best efforts to proofread,  errors can occur which can change the documentation meaning.

## 2022-09-21 NOTE — Progress Notes (Signed)
Progress Note   Patient: Ryan [name] ZOX:[id number] DOB: [birth year] DOA: 09/07/2022     14 DOS: the patient was seen and examined on 09/21/2022   Brief hospital course: 62 y.o. male with a history of hypertension, hyperlipidemia, ESRD on hemodialysis.  Patient presented secondary to low back pain and was found to have evidence of L5-S1 discitis/osteomyelitis in addition to MSSA bacteremia.  ID consulted.  Workup significant for possible endocarditis. Patient also found to have a small bowel obstruction.   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   -Blood cultures positive for MSSA.   -ID consulted and pt is now on cefazolin IV with hemodialysis. Plan for 6 weeks of tx from 6/18  -Transthoracic echocardiogram significant for a large, rounded structure on the anterior leaflet of the mitral valve concerning for endocarditis. -Chest imaging was consistent with septic emboli -ID initially recommended  transesophageal echocardiogram, however TEE was deferred secondary to risk/benefit profile.  -CT surg was consulted and recommended non-surgical management of mitral valve endocarditis. Leukocytosis now improving -Report of slurred speech and some mentation changes reported by family. Brain MRI reviewed, findings concerning for septic emboli in bilateral hemispheres and R cerebellum. Reviewed with Neurology who agrees with septic emboli. Recommendation to cont abx with NIHSS scale -EEG neg for seizures, reviewed -Remains stable this AM   ESRD on hemodialysis -Nephrology following   -Patient presented with a tunneled dialysis catheter which was removed on 6/18 secondary to bacteremia.   - Pt was s/p R femoral non-tunneled HD CVC 6/26, not functioning today. -IR planning tunneled cath on Monday -continue HD plans per Nephrology    Intractable hiccups -will continue with thorazine as needed   Small bowel obstruction -Patient without a bowel movement for several days initially with SBO noted on abd CT with transition point -General surgery was consulted.  -symptoms later improved and diet was advanced -GI later signed off as of 6/25   Primary hypertension -Atenolol on hold secondary to soft BP -BP currently very well controlled   Elevated troponin -Previously noted. No associated chest pain. Presumed secondary to demand ischemia.   Lung nodules -Noted on initial CT scan.  Initial recommendation for repeat CT scan of the chest in 18 to 24 months.  Repeat CT scan on 6/20 showed multiple new pulmonary nodules compatible with likely active infection and/or aspiration.   -given known endocarditis, most likely septic emboli to lungs -most recent blood cx are neg x 5 days   Bladder wall thickening Noted incidentally on CT imaging.  No urinary symptoms and patient does not make urine secondary to ESRD. Unclear if this correlates with isolated infection or is related to overall infectious picture.      Subjective: Reports continued discomfort over lower back, improved with heat application  Physical Exam: Vitals:   09/20/22 1059 09/20/22 1922 09/21/22 0620 09/21/22 0948  BP: 113/69 118/64 96/64 113/69  Pulse: 76 79 77 78  Resp: 18 14 18 18   Temp: 98.1 F (36.7 C) 98.4 F (36.9 C) 98.2 F (36.8 C) 97.6 F (36.4 C)  TempSrc:   Oral Oral  SpO2: 92% 93% 92% (!) 87%  Weight:      Height:       General exam: Awake, laying in bed, in nad Respiratory system: Normal respiratory effort, no wheezing Cardiovascular system: regular rate, s1, s2 Gastrointestinal system: Soft, nondistended, positive BS Central nervous  system: CN2-12 grossly intact, strength intact Extremities: Perfused, no clubbing Skin: Normal skin turgor, no notable skin lesions seen Psychiatry: Mood normal // no visual hallucinations   Data  Reviewed:  Labs reviewed: Na 126, K 5.4, Cr 13.01  Family Communication: Pt in room, family not at bedside  Disposition: Status is: Inpatient Remains inpatient appropriate because: Severity of illness  Planned Discharge Destination: Home     Author: Rickey Barbara, MD 09/21/2022 4:23 PM  For on call review [email or web address].

## 2022-09-21 NOTE — Progress Notes (Addendum)
[facility] Progress Note   Subjective: No HD 09/20/2022 D/T access failure. Going for [facility] placement tomorrow. HD afterwards off schedule. K+ 5.3 today.    Objective Vitals:   09/20/22 1059 09/20/22 1922 09/21/22 0620 09/21/22 0948  BP: 113/69 118/64 96/64 113/69  Pulse: 76 79 77 78  Resp: 18 14 18 18   Temp: 98.1 F (36.7 C) 98.4 F (36.9 C) 98.2 F (36.8 C) 97.6 F (36.4 C)  TempSrc:   Oral Oral  SpO2: 92% 93% 92% (!) 87%  Weight:      Height:       hysical Exam General: Pleasant, NAD Heart: S1,S2 No M/R/G SR  Lungs: CTAB Anteriorly. Abdomen: NABS, NT Extremities: No LE edema Dialysis Access: R femoral Temp catheter drsg intact   Additional Objective Labs: Basic Metabolic Panel: Recent Labs  Lab 09/15/22 0127 09/16/22 0338 09/17/22 0346 09/18/22 0234 09/19/22 0424 09/20/22 0250 09/21/22 0513  NA 129* 127*   < > 128* 128* 128* 127*  K 4.7 5.2*   < > 4.7 4.4 4.7 5.3*  CL 91* 89*   < > 91* 90* 90* 89*  CO2 19* 18*   < > 19* 23 22 22   GLUCOSE 110* 98   < > 111* 130* 115* 104*  BUN 86* 103*   < > 83* 66* 87* 104*  CREATININE 11.52* 13.26*   < > 11.23* 9.57* 11.38* 13.26*  CALCIUM 7.8* 8.0*   < > 8.0* 7.8* 7.8* 7.9*  PHOS 10.6* 11.7*  --  8.8*  --   --   --    < > = values in this interval not displayed.   Liver Function Tests: Recent Labs  Lab 09/19/22 0424 09/20/22 0250 09/21/22 0513  AST 41 52* 61*  ALT 6 6 6   ALKPHOS 104 93 97  BILITOT 0.5 0.6 0.6  PROT 7.0 6.7 7.1  ALBUMIN 1.6* 1.5* 1.7*   No results for input(s): "LIPASE", "AMYLASE" in the last 168 hours. CBC: Recent Labs  Lab 09/17/22 0346 09/18/22 0234 09/19/22 0424 09/20/22 0250 09/21/22 0513  WBC 26.2* 24.9* 27.2* 22.5* 24.6*  HGB 8.7* 8.9* 8.5* 7.8* 8.2*  HCT 25.2* 25.2* 24.9* 23.2* 23.6*  MCV 78.5* 79.7* 80.1 82.3 81.7  PLT 299 305 293 292 313   Blood Culture    Component Value Date/Time   SDES BLOOD RIGHT ANTECUBITAL 09/10/2022 1516   SDES BLOOD LEFT ARM  09/10/2022 1516   SPECREQUEST  09/10/2022 1516    BOTTLES DRAWN AEROBIC AND ANAEROBIC Blood Culture adequate volume   SPECREQUEST  09/10/2022 1516    BOTTLES DRAWN AEROBIC AND ANAEROBIC Blood Culture adequate volume   CULT  09/10/2022 1516    NO GROWTH 5 DAYS Performed at [facility], [address] [place], [place] [address]    CULT  09/10/2022 1516    NO GROWTH 5 DAYS Performed at [facility], [address], [place], [place] [address]    REPTSTATUS 09/15/2022 FINAL 09/10/2022 1516   REPTSTATUS 09/15/2022 FINAL 09/10/2022 1516    Cardiac Enzymes: No results for input(s): "CKTOTAL", "CKMB", "CKMBINDEX", "TROPONINI" in the last 168 hours. CBG: No results for input(s): "GLUCAP" in the last 168 hours. Iron Studies: No results for input(s): "IRON", "TIBC", "TRANSFERRIN", "FERRITIN" in the last 72 hours. @lablastinr3 @ Studies/Results: No results found. Medications:   ceFAZolin (ANCEF) IV 2 g (09/20/22 1902)   methocarbamol (ROBAXIN) IV 110 mL/hr at 09/20/22 1600    (feeding supplement) PROSource Plus  30  mL Oral BID BM   atenolol  12.5 mg Oral Daily   calcitRIOL  0.5 mcg Oral Q T,Th,Sat-1800   Chlorhexidine Gluconate Cloth  6 each Topical Q0600   cinacalcet  30 mg Oral Q supper   darbepoetin (ARANESP) injection - DIALYSIS  60 mcg Subcutaneous Q Thu-1800   feeding supplement (NEPRO CARB STEADY)  237 mL Oral BID BM   multivitamin  1 tablet Oral QHS   pantoprazole  40 mg Oral Daily   sodium chloride flush  3 mL Intravenous Q12H   sodium zirconium cyclosilicate  10 g Oral BID   sucroferric oxyhydroxide  500 mg Oral TID WC     Dialysis Orders: NW TTS 3:45 450/A1.5x 2K/2Ca EDW 85.7kg TDC  -Heparin 3000 units IV TIW  -No ESA -Calcitriol 1.5 MCG PO TIW     Assessment/Plan: 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 ID following. Repeat blood cultures ordered-+ MSSA.  On cefazolin. TDC removed 6/18. Completed 72 hour line holiday. TTE concerning  for endocarditis. TEE cancelled d/t high risk factors. Rpt blood cultures 6/19 -neg.  WBCs 28->31>>26 >>24. Temp cath placed 6/21. Appreciate ID input.  Continue cefazolin 2g q HD x 6 weeks (until 11/04/22).  Dialysis Access: non-tunneled fem HD cath in place.  Had temp cath exchanged 6/26 d/t poor function. Temp catheter failed again 09/20/2022. Going for [facility] 09/22/2022 in IR.  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 Improved. Surgery signed off.  Hyperkalemia: K+ 5.3. Missed HD D/T malfunctioning Temp catheter. Lokelma 10 gram PO BID today. Follow labs.  ESRD -  HD TTS.  No HD since 09/18/2022. Going for [facility] placement in AM. HD 09/22/2022 off schedule. Will need short HD 09/23/2022 to resume T,TH,S Schedule.  Perm Access -  Had L AVF ligation in 2019. Last seen by Dr. Edilia Bo 01/2022 -limited mobility in R arm so planning for L arm graft but had not scheduled surgery yet.   Hypertension/volume  - BP actually on low side with SBP 90s. Very much under OP EDW if weights are correct. Lower EDW on discharge Get standing wts .UF as tolerated.  Anemia  - Hb 8.2 Started ESA. No iron load with infection.   Metabolic bone disease -  Corr Ca acceptable. Phos 8.8. d/c'd sevelamer d/t SBO. Add Velphoro binder. RFP added on to today's blood draw.  Nutrition - Renal diet, very low albumin  Add prot supplement Dispo - possible CIR admission.Dene Gentry. Randle Shatzer NP-C 09/21/2022, 10:06 AM  [employer] [phone]

## 2022-09-21 NOTE — Evaluation (Signed)
Speech Language Pathology Evaluation Patient Details Name: Ryan [name] MRN: [id number] DOB: 18-Apr-1960 Today's Date: 09/21/2022 Time: 8756-4332 SLP Time Calculation (min) (ACUTE ONLY): 23 min  Problem List:  Patient Active Problem List   Diagnosis Date Noted   MSSA bacteremia 09/08/2022   Discitis of lumbosacral region 09/08/2022   Acute low back pain 09/07/2022   Lung nodules 09/07/2022   Elevated troponin 09/07/2022   SIRS (systemic inflammatory response syndrome) (HCC) 09/07/2022   Hypocalcemia 04/24/2020   COVID-19 01/20/2020   Diverticulitis of colon with bleeding 03/09/2019   Complication of vascular dialysis catheter 12/27/2018   Iron deficiency anemia, unspecified 05/06/2018   Cough 10/30/2017   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   Chronic diastolic heart failure, NYHA class 3 (HCC) 10/18/2017   Hyperlipidemia 09/19/2017   Shortness of breath 09/19/2017   PND (paroxysmal nocturnal dyspnea) 09/14/2017   DOE (dyspnea on exertion) 09/14/2017   Recurrent right pleural effusion 08/28/2017   ESRD on dialysis (HCC) 08/27/2017   Lesion of vertebra 07/13/2017   Mild protein-calorie malnutrition (HCC) 05/23/2017   Fluid overload, unspecified 05/06/2017   Hyperkalemia 05/01/2017   Personal history of urinary (tract) infections 01/15/2017   Encounter for fitting and adjustment of extracorporeal dialysis catheter (HCC) 09/27/2016   Coagulation defect, unspecified (HCC) 08/28/2016   Kidney transplant failure 08/28/2016   Secondary hyperparathyroidism of renal origin (HCC) 08/28/2016   Type 2 diabetes mellitus with diabetic chronic kidney disease (HCC) 08/28/2016   Renal transplant, status post 08/26/2016   Hypertension 08/26/2016   Anemia of chronic disease 04/09/2016   EKG abnormalities    Other hydronephrosis 03/04/2016   Past Medical History:  Past Medical History:  Diagnosis Date   Allergy    Anemia    Blood transfusion without  reported diagnosis    Dialysis patient (HCC)    Tues,thurs,sat   ESRD (end stage renal disease) (HCC)    TTHS-    Family history of adverse reaction to anesthesia    father had allergic reaction with anesthesia with a dental procedure-(pt. doesn't know)   Hyperlipidemia    Hypertension    Past Surgical History:  Past Surgical History:  Procedure Laterality Date   A/V FISTULAGRAM Left 12/15/2016   Procedure: A/V Fistulagram - left;  Surgeon: Chuck Hint, MD;  Location: [facility] INVASIVE CV LAB;  Service: Cardiovascular;  Laterality: Left;   AV FISTULA PLACEMENT Left 08/28/2016   Procedure: LEFT UPPER  ARM ARTERIOVENOUS (AV) FISTULA CREATION;  Surgeon: Chuck Hint, MD;  Location: [facility] OR;  Service: Vascular;  Laterality: Left;   DIALYSIS/PERMA CATHETER INSERTION Right 12/21/2017   Procedure: INSERTION OF DIALYSIS CATHETER Right Internal Jugular .;  Surgeon: Sherren Kerns, MD;  Location: [facility] OR;  Service: Vascular;  Laterality: Right;   FISTULA SUPERFICIALIZATION Left 09/23/2017   Procedure: FISTULA PLICATION LEFT ARM;  Surgeon: Sherren Kerns, MD;  Location: [facility] OR;  Service: Vascular;  Laterality: Left;   FISTULOGRAM Left 12/21/2017   Procedure: FISTULOGRAM with Balloon Angioplasty.;  Surgeon: Sherren Kerns, MD;  Location: [facility] OR;  Service: Vascular;  Laterality: Left;   HEMATOMA EVACUATION Left 08/27/2020   Procedure: EVACUATION HEMATOMA LEFT ARM;  Surgeon: Sherren Kerns, MD;  Location: [facility] OR;  Service: Vascular;  Laterality: Left;   IR FLUORO GUIDE CV LINE RIGHT  09/12/2022   IR FLUORO GUIDE CV LINE RIGHT  09/17/2022   IR REMOVAL TUN CV CATH W/O FL  09/09/2022   IR THORACENTESIS ASP PLEURAL SPACE W/IMG GUIDE  08/22/2017   1.2 L -right-sided   IR THORACENTESIS ASP PLEURAL SPACE W/IMG GUIDE  10/19/2017   IR US GUIDE VASC ACCESS RIGHT  09/12/2022   KIDNEY TRANSPLANT     REVISON OF ARTERIOVENOUS FISTULA Left 12/21/2017   Procedure: PLICATION and Ligation of F LEFT  ARM ARTERIOVENOUS FISTULA;  Surgeon: Sherren Kerns, MD;  Location: [facility] OR;  Service: Vascular;  Laterality: Left;   REVISON OF ARTERIOVENOUS FISTULA Left 07/16/2020   Procedure: EXCISION OF LEFT ARM ARTERIOVENOUS FISTULA;  Surgeon: Sherren Kerns, MD;  Location: [facility] OR;  Service: Vascular;  Laterality: Left;   stent in kidneys     dec 2017   TRANSTHORACIC ECHOCARDIOGRAM  09/22/2017    Severe LVH.  Normal function -EF 55-60%.  GRII DD.  Moderate RV dilation with mildly reduced RV function.   Fixed right coronary cusp with very mild aortic stenosis.  The myocardium has a speckled appearance --> .   Recommend cardiac MRI to evaluate for amyloid   UPPER EXTREMITY VENOGRAPHY Bilateral 06/01/2020   Procedure: UPPER EXTREMITY VENOGRAPHY;  Surgeon: Chuck Hint, MD;  Location: [facility] INVASIVE CV LAB;  Service: Cardiovascular;  Laterality: Bilateral;   HPI:      Assessment / Plan / Recommendation Clinical Impression  Cognitive-linguistic evaluation complete. Patient scoring WFL for all areas adressed with the exception of higher level mathmatical reasoning. Patient endorsed stress and anxiety which certainly could contribute to this deficit being present although cannot r/o impact of small CVAs. SLP will f/u with focus on higher level cognition with goal to return home independently.    SLP Assessment  SLP Recommendation/Assessment: Patient needs continued Speech Lanaguage Pathology Services SLP Visit Diagnosis: Frontal lobe and executive function deficit Frontal lobe and executive function deficit following: Cerebral infarction    Recommendations for follow up therapy are one component of a multi-disciplinary discharge planning process, led by the attending physician.  Recommendations may be updated based on patient status, additional functional criteria and insurance authorization.    Follow Up Recommendations  Acute inpatient rehab (3hours/day)    Assistance Recommended at Discharge      Functional Status Assessment Patient has had a recent decline in their functional status and demonstrates the ability to make significant improvements in function in a reasonable and predictable amount of time.  Frequency and Duration min 1 x/week  2 weeks      SLP Evaluation Cognition  Overall Cognitive Status: No family/caregiver present to determine baseline cognitive functioning Arousal/Alertness: Awake/alert Orientation Level: Oriented X4 Attention:  (intermittently distracted internally but able to redirect independently) Memory: Appears intact Awareness: Appears intact Problem Solving: Appears intact Executive Function: Reasoning Reasoning: Impaired Reasoning Impairment: Functional complex Safety/Judgment: Appears intact       Comprehension  Auditory Comprehension Overall Auditory Comprehension: Appears within functional limits for tasks assessed Visual Recognition/Discrimination Discrimination: Within Function Limits Reading Comprehension Reading Status: Within funtional limits    Expression Expression Primary Mode of Expression: Verbal Verbal Expression Overall Verbal Expression: Appears within functional limits for tasks assessed Written Expression Dominant Hand: Right Written Expression: Within Functional Limits   Oral / Motor  Oral Motor/Sensory Function Overall Oral Motor/Sensory Function: Within functional limits Motor Speech Overall Motor Speech: Appears within functional limits for tasks assessed           Ferdinand Lango MA, CCC-SLP  Maxcine Strong Meryl 09/21/2022, 10:30 AM

## 2022-09-22 ENCOUNTER — Encounter (HOSPITAL_BASED_OUTPATIENT_CLINIC_OR_DEPARTMENT_OTHER): Payer: No Typology Code available for payment source | Admitting: Internal Medicine

## 2022-09-22 ENCOUNTER — Inpatient Hospital Stay (HOSPITAL_COMMUNITY): Payer: No Typology Code available for payment source

## 2022-09-22 DIAGNOSIS — N289 Disorder of kidney and ureter, unspecified: Secondary | ICD-10-CM | POA: Diagnosis not present

## 2022-09-22 DIAGNOSIS — T80211A Bloodstream infection due to central venous catheter, initial encounter: Secondary | ICD-10-CM | POA: Diagnosis not present

## 2022-09-22 DIAGNOSIS — J984 Other disorders of lung: Secondary | ICD-10-CM | POA: Diagnosis not present

## 2022-09-22 DIAGNOSIS — D631 Anemia in chronic kidney disease: Secondary | ICD-10-CM | POA: Diagnosis not present

## 2022-09-22 DIAGNOSIS — N250 Renal osteodystrophy: Secondary | ICD-10-CM | POA: Diagnosis not present

## 2022-09-22 DIAGNOSIS — I330 Acute and subacute infective endocarditis: Secondary | ICD-10-CM | POA: Diagnosis not present

## 2022-09-22 DIAGNOSIS — R918 Other nonspecific abnormal finding of lung field: Secondary | ICD-10-CM | POA: Diagnosis not present

## 2022-09-22 DIAGNOSIS — I2690 Septic pulmonary embolism without acute cor pulmonale: Secondary | ICD-10-CM | POA: Diagnosis not present

## 2022-09-22 DIAGNOSIS — I76 Septic arterial embolism: Secondary | ICD-10-CM | POA: Diagnosis not present

## 2022-09-22 DIAGNOSIS — K56600 Partial intestinal obstruction, unspecified as to cause: Secondary | ICD-10-CM | POA: Diagnosis not present

## 2022-09-22 DIAGNOSIS — E440 Moderate protein-calorie malnutrition: Secondary | ICD-10-CM | POA: Diagnosis not present

## 2022-09-22 DIAGNOSIS — Z1152 Encounter for screening for COVID-19: Secondary | ICD-10-CM | POA: Diagnosis not present

## 2022-09-22 DIAGNOSIS — I120 Hypertensive chronic kidney disease with stage 5 chronic kidney disease or end stage renal disease: Secondary | ICD-10-CM | POA: Diagnosis not present

## 2022-09-22 DIAGNOSIS — E1122 Type 2 diabetes mellitus with diabetic chronic kidney disease: Secondary | ICD-10-CM | POA: Diagnosis not present

## 2022-09-22 DIAGNOSIS — J9601 Acute respiratory failure with hypoxia: Secondary | ICD-10-CM | POA: Diagnosis not present

## 2022-09-22 DIAGNOSIS — R0989 Other specified symptoms and signs involving the circulatory and respiratory systems: Secondary | ICD-10-CM | POA: Diagnosis not present

## 2022-09-22 DIAGNOSIS — R7881 Bacteremia: Secondary | ICD-10-CM | POA: Diagnosis not present

## 2022-09-22 DIAGNOSIS — M4647 Discitis, unspecified, lumbosacral region: Secondary | ICD-10-CM | POA: Diagnosis not present

## 2022-09-22 DIAGNOSIS — J188 Other pneumonia, unspecified organism: Secondary | ICD-10-CM | POA: Diagnosis not present

## 2022-09-22 DIAGNOSIS — N186 End stage renal disease: Secondary | ICD-10-CM | POA: Diagnosis not present

## 2022-09-22 DIAGNOSIS — B9561 Methicillin susceptible Staphylococcus aureus infection as the cause of diseases classified elsewhere: Secondary | ICD-10-CM | POA: Diagnosis not present

## 2022-09-22 DIAGNOSIS — Z992 Dependence on renal dialysis: Secondary | ICD-10-CM | POA: Diagnosis not present

## 2022-09-22 HISTORY — PX: IR FLUORO GUIDE CV LINE LEFT: IMG2282

## 2022-09-22 HISTORY — PX: IR US GUIDE VASC ACCESS LEFT: IMG2389

## 2022-09-22 LAB — COMPREHENSIVE METABOLIC PANEL
ALT: 7 U/L (ref 0–44)
AST: 59 U/L — ABNORMAL HIGH (ref 15–41)
Albumin: 1.8 g/dL — ABNORMAL LOW (ref 3.5–5.0)
Alkaline Phosphatase: 118 U/L (ref 38–126)
Anion gap: 17 — ABNORMAL HIGH (ref 5–15)
BUN: 119 mg/dL — ABNORMAL HIGH (ref 8–23)
CO2: 20 mmol/L — ABNORMAL LOW (ref 22–32)
Calcium: 7.7 mg/dL — ABNORMAL LOW (ref 8.9–10.3)
Chloride: 87 mmol/L — ABNORMAL LOW (ref 98–111)
Creatinine, Ser: 14.43 mg/dL — ABNORMAL HIGH (ref 0.61–1.24)
GFR, Estimated: 3 mL/min — ABNORMAL LOW (ref >60)
Glucose, Bld: 129 mg/dL — ABNORMAL HIGH (ref 70–99)
Potassium: 6 mmol/L — ABNORMAL HIGH (ref 3.5–5.1)
Sodium: 124 mmol/L — ABNORMAL LOW (ref 135–145)
Total Bilirubin: 0.8 mg/dL (ref 0.3–1.2)
Total Protein: 7.6 g/dL (ref 6.5–8.1)

## 2022-09-22 LAB — CBC
HCT: 23.5 % — ABNORMAL LOW (ref 39.0–52.0)
Hemoglobin: 7.7 g/dL — ABNORMAL LOW (ref 13.0–17.0)
MCH: 26.4 pg (ref 26.0–34.0)
MCHC: 32.8 g/dL (ref 30.0–36.0)
MCV: 80.5 fL (ref 80.0–100.0)
Platelets: 312 10*3/uL (ref 150–400)
RBC: 2.92 MIL/uL — ABNORMAL LOW (ref 4.22–5.81)
RDW: 15.5 % (ref 11.5–15.5)
WBC: 25.1 10*3/uL — ABNORMAL HIGH (ref 4.0–10.5)
nRBC: 0 % (ref 0.0–0.2)

## 2022-09-22 MED ORDER — HEPARIN SODIUM (PORCINE) 1000 UNIT/ML IJ SOLN
INTRAMUSCULAR | Status: AC
  Filled 2022-09-22: qty 10

## 2022-09-22 MED ORDER — HEPARIN SODIUM (PORCINE) 1000 UNIT/ML IJ SOLN
INTRAMUSCULAR | Status: AC
  Filled 2022-09-22: qty 4

## 2022-09-22 MED ORDER — ALPRAZOLAM 0.5 MG PO TABS
0.5000 mg | ORAL_TABLET | ORAL | Status: AC | PRN
  Administered 2022-09-22: 0.5 mg via ORAL
  Filled 2022-09-22: qty 1

## 2022-09-22 MED ORDER — CAMPHOR-MENTHOL 0.5-0.5 % EX LOTN
TOPICAL_LOTION | CUTANEOUS | Status: DC | PRN
  Filled 2022-09-22: qty 222

## 2022-09-22 MED ORDER — IOHEXOL 300 MG/ML  SOLN
100.0000 mL | Freq: Once | INTRAMUSCULAR | Status: AC | PRN
  Administered 2022-09-22: 20 mL via INTRAVENOUS

## 2022-09-22 MED ORDER — SUCROFERRIC OXYHYDROXIDE 500 MG PO CHEW
1000.0000 mg | CHEWABLE_TABLET | Freq: Three times a day (TID) | ORAL | Status: DC
  Administered 2022-09-23 – 2022-09-25 (×5): 1000 mg via ORAL
  Filled 2022-09-22 (×5): qty 2

## 2022-09-22 MED ORDER — SODIUM ZIRCONIUM CYCLOSILICATE 10 G PO PACK
10.0000 g | PACK | Freq: Once | ORAL | Status: AC
  Administered 2022-09-22: 10 g via ORAL
  Filled 2022-09-22: qty 1

## 2022-09-22 MED ORDER — MIDAZOLAM HCL 2 MG/2ML IJ SOLN
INTRAMUSCULAR | Status: AC
  Filled 2022-09-22: qty 4

## 2022-09-22 MED ORDER — FENTANYL CITRATE (PF) 100 MCG/2ML IJ SOLN
INTRAMUSCULAR | Status: AC
  Filled 2022-09-22: qty 4

## 2022-09-22 MED ORDER — HEPARIN SODIUM (PORCINE) 1000 UNIT/ML DIALYSIS
3000.0000 [IU] | Freq: Once | INTRAMUSCULAR | Status: AC
  Administered 2022-09-23: 3000 [IU] via INTRAVENOUS_CENTRAL
  Filled 2022-09-22: qty 3

## 2022-09-22 MED ORDER — HEPARIN SODIUM (PORCINE) 1000 UNIT/ML DIALYSIS
1000.0000 [IU] | INTRAMUSCULAR | Status: DC | PRN
  Administered 2022-09-22: 1000 [IU]
  Administered 2022-09-23: 3800 [IU]
  Filled 2022-09-22 (×3): qty 1

## 2022-09-22 MED ORDER — FENTANYL CITRATE (PF) 100 MCG/2ML IJ SOLN
INTRAMUSCULAR | Status: AC | PRN
  Administered 2022-09-22 (×2): 25 ug via INTRAVENOUS

## 2022-09-22 MED ORDER — PENTAFLUOROPROP-TETRAFLUOROETH EX AERO: 1.0000 | INHALATION_SPRAY | CUTANEOUS | Status: DC | PRN

## 2022-09-22 MED ORDER — CEFAZOLIN SODIUM-DEXTROSE 2-4 GM/100ML-% IV SOLN
INTRAVENOUS | Status: AC | PRN
  Administered 2022-09-22: 2 g via INTRAVENOUS

## 2022-09-22 MED ORDER — LIDOCAINE-EPINEPHRINE 1 %-1:100000 IJ SOLN
INTRAMUSCULAR | Status: AC
  Filled 2022-09-22: qty 1

## 2022-09-22 MED ORDER — CHLORHEXIDINE GLUCONATE 4 % EX SOLN
CUTANEOUS | Status: AC
  Filled 2022-09-22: qty 15

## 2022-09-22 MED ORDER — MIDAZOLAM HCL 2 MG/2ML IJ SOLN
INTRAMUSCULAR | Status: AC | PRN
  Administered 2022-09-22: .5 mg via INTRAVENOUS
  Administered 2022-09-22: 1 mg via INTRAVENOUS

## 2022-09-22 MED ORDER — CEFAZOLIN SODIUM-DEXTROSE 2-4 GM/100ML-% IV SOLN
INTRAVENOUS | Status: AC
  Filled 2022-09-22: qty 100

## 2022-09-22 MED ORDER — LIDOCAINE-PRILOCAINE 2.5-2.5 % EX CREA
1.0000 | TOPICAL_CREAM | CUTANEOUS | Status: DC | PRN
  Filled 2022-09-22: qty 5

## 2022-09-22 MED ORDER — LIDOCAINE HCL (PF) 1 % IJ SOLN: 5.0000 mL | INTRAMUSCULAR | Status: DC | PRN

## 2022-09-22 MED ORDER — HEPARIN SODIUM (PORCINE) 1000 UNIT/ML DIALYSIS
3000.0000 [IU] | Freq: Once | INTRAMUSCULAR | Status: DC
  Filled 2022-09-22 (×2): qty 3

## 2022-09-22 NOTE — Plan of Care (Signed)
°  Problem: Coping: °Goal: Level of anxiety will decrease °Outcome: Progressing °  °

## 2022-09-22 NOTE — Progress Notes (Signed)
Patient was placed on continuous BiPap due to SOB. Patient mobility has decreased to SOB with any exertion.

## 2022-09-22 NOTE — Significant Event (Signed)
Rapid Response Event Note   Reason for Call : Increased WOB Initial Focused Assessment:  Notified by nursing staff of pt with increased WOB with accessory muscle use. Pt is alert, oriented x4 with mild labored breathing. Abdominal accessory muscle use. Denies CP but states it is hard to breathe. BBS Rhonchi and diminished with expiratory upper airway wheeze. Skin warm and dry. Pt did not complete HD on Thursday due to catheter problems. He is scheduled for a catheter replacement this afternoon.   0600-97.71F, HR 88 SR, BP 155/81, RR 24 with sats 96% on 2L [place].    Interventions: Per MD order -Stat PCXR  -BIPAP -Tx PCU        MD Notified: Dr. Antionette Char per Primary RN Call Time: [phone] Arrival Time: 1308 End Time:  Rose Fillers, RN

## 2022-09-22 NOTE — Progress Notes (Signed)
RT at bedside this AM to find pt on BiPAP tolerating well.

## 2022-09-22 NOTE — Progress Notes (Signed)
SLP Cancellation Note  Patient Details Name: Ryan [name] MRN: [id number] DOB: 1960-08-26   Cancelled treatment:       Reason Eval/Treat Not Completed: Patient declined, no reason specified; pt currently on Bipap; kindly deferred; ST will continue efforts to assess cognition prn.   Pat Rasaan Brotherton,M.S., CCC-SLP 09/22/2022, 1:17 PM

## 2022-09-22 NOTE — Progress Notes (Signed)
OT Cancellation Note  Patient Details Name: Ryan [name] MRN: [id number] DOB: Oct 06, 1960   Cancelled Treatment:    Reason Eval/Treat Not Completed: Patient declined, no reason specified. OT will follow up next available time  Galen Manila 09/22/2022, 2:00 PM

## 2022-09-22 NOTE — Consult Note (Signed)
   [facility] Inpatient Consult   09/22/2022  Ryan [name] 03-27-1960 [id number]    Primary Care Provider:  Sharin Grave, MD, is with [facility] which is not a [facility] provider  Insurance:  [facility]  Met with patient and visitor at the bedside. Patient was on BiPAP currently. Explained reason for rounding and patient endorses [facility] as new provider and does not currently have an appointment.    The provider is not on the current member enrollment rosters for any of the [facility] risk contracted plans with a Nature conservation officer.     Reason:  Not a beneficiary currently attributed to one of the [facility] Primary Care Providers.  For questions, please call:  Will sign off.  Charlesetta Shanks, RN BSN CCM [facility]  [phone] business mobile phone Toll free office [phone]  *Concierge Line  [phone] Fax number: [phone] [email or web address]

## 2022-09-22 NOTE — Progress Notes (Signed)
   09/22/22 1017  Spiritual Encounters  Type of Visit Initial  Care provided to: Pt and family  Referral source Social worker/Care management/TOC;Chaplain team  Reason for visit Advance directives  OnCall Visit No   Chap visited Pt to provide education about Advanced Care Directives (ACD) for Pt. Pt listened and agreed. Chap left a copy for Pt to fill out with HPOA information. Chap asked Pt to contact Chap to notarize the form once he has it ready for notarization in front of witnesses. Pt remembered Mirna Mires from previous visit and finished conversation with a prayer of thanksgiving for the new opportunity to be together and requesting fortitude for his treatment.

## 2022-09-22 NOTE — Progress Notes (Signed)
[facility] Progress Note   Subjective:  Seen in room - was wearing BiPAP at time of my visit, was placed at 6:23am by RT for increased WOB. He tells me he feels good now, wants to take it off - removed and put on 4L [place] O2 - O2 was 93%, notified RN to monitor, can replace BiPAP if O2 drops at all. Called IR to verify timing for [facility] placement as he needs HD. IR states he is scheduled at 1pm due to difficult vasculature and need for 13hr-contrast prep, but charge RN will speak with MD to see if they can move him up. Will bring him directly from IR to dialysis after line is placed.  Objective Vitals:   09/22/22 0551 09/22/22 0652 09/22/22 0826 09/22/22 0834  BP: (!) 155/81   (!) 154/80  Pulse: 88 78 73 73  Resp: (!) 28 20 (!) 31 (!) 27  Temp: 97.6 F (36.4 C)   98.9 F (37.2 C)  TempSrc: Axillary   Axillary  SpO2: 97% 97% 94% 95%  Weight:      Height:       Physical Exam General: Well appearing man, NAD. BiPAP -> 4L [place] O2, may need to replace if O2 sats drop Heart: RRR; no murmur Lungs: Bibasilar rales Abdomen: soft Extremities: no LE edema Dialysis Access:  none  Additional Objective Labs: Basic Metabolic Panel: Recent Labs  Lab 09/16/22 0338 09/17/22 0346 09/18/22 0234 09/19/22 0424 09/21/22 0513 09/21/22 1037 09/22/22 0416  NA 127*   < > 128*   < > 127* 126* 124*  K 5.2*   < > 4.7   < > 5.3* 5.4* 6.0*  CL 89*   < > 91*   < > 89* 86* 87*  CO2 18*   < > 19*   < > 22 20* 20*  GLUCOSE 98   < > 111*   < > 104* 134* 129*  BUN 103*   < > 83*   < > 104* 109* 119*  CREATININE 13.26*   < > 11.23*   < > 13.26* 13.01* 14.43*  CALCIUM 8.0*   < > 8.0*   < > 7.9* 8.0* 7.7*  PHOS 11.7*  --  8.8*  --   --  9.2*  --    < > = values in this interval not displayed.   Liver Function Tests: Recent Labs  Lab 09/20/22 0250 09/21/22 0513 09/21/22 1037 09/22/22 0416  AST 52* 61*  --  59*  ALT 6 6  --  7  ALKPHOS 93 97  --  118  BILITOT 0.6 0.6  --  0.8  PROT 6.7  7.1  --  7.6  ALBUMIN 1.5* 1.7* 1.7* 1.8*   CBC: Recent Labs  Lab 09/18/22 0234 09/19/22 0424 09/20/22 0250 09/21/22 0513 09/22/22 0416  WBC 24.9* 27.2* 22.5* 24.6* 25.1*  HGB 8.9* 8.5* 7.8* 8.2* 7.7*  HCT 25.2* 24.9* 23.2* 23.6* 23.5*  MCV 79.7* 80.1 82.3 81.7 80.5  PLT 305 293 292 313 312   Blood Culture    Component Value Date/Time   SDES BLOOD RIGHT ANTECUBITAL 09/10/2022 1516   SDES BLOOD LEFT ARM 09/10/2022 1516   SPECREQUEST  09/10/2022 1516    BOTTLES DRAWN AEROBIC AND ANAEROBIC Blood Culture adequate volume   SPECREQUEST  09/10/2022 1516    BOTTLES DRAWN AEROBIC AND ANAEROBIC Blood Culture adequate volume   CULT  09/10/2022 1516    NO GROWTH 5 DAYS Performed at [facility] Lab, [address] [place], [place] [address]    CULT  09/10/2022 1516    NO GROWTH 5 DAYS Performed at [facility], [address] [place], [place] [address]    REPTSTATUS 09/15/2022 FINAL 09/10/2022 1516   REPTSTATUS 09/15/2022 FINAL 09/10/2022 1516   Studies/Results: DG CHEST PORT 1 VIEW  Result Date: 09/22/2022 CLINICAL DATA:  Acute respiratory distress. History of end-stage renal disease. EXAM: PORTABLE CHEST 1 VIEW COMPARISON:  CT 09/07/2022 FINDINGS: There is enlargement of the cardiac silhouette which is favored to represent artifact from portable technique, although underlying pericardial effusion would be difficult to exclude. Lung volumes are low. There is new opacification of the retrocardiac left lower lobe. New airspace opacity is identified within the left upper lobe. New nodular density with central cavitation is suspected within the apical segment of the left upper lobe measuring 1.2 cm. The right lung appears relatively clear. IMPRESSION: 1. New airspace opacity within the left upper lobe and retrocardiac left lower lobe concerning for pneumonia. 2. New nodular density with central cavitation within the apical segment of the left upper lobe. Consider follow-up CT of the  chest for more definitive characterization. 3. Enlargement of the cardiac silhouette compared with previous exam may reflect artifact from AP portable technique. Underlying pericardial effusion would be difficult to exclude. Electronically Signed   By: Signa Kell M.D.   On: 09/22/2022 07:17    Medications:   ceFAZolin (ANCEF) IV 2 g (09/20/22 1902)   methocarbamol (ROBAXIN) IV 110 mL/hr at 09/20/22 1600    (feeding supplement) PROSource Plus  30 mL Oral BID BM   atenolol  12.5 mg Oral Daily   calcitRIOL  0.5 mcg Oral Q T,Th,Sat-1800   Chlorhexidine Gluconate Cloth  6 each Topical Q0600   cinacalcet  30 mg Oral Q supper   darbepoetin (ARANESP) injection - DIALYSIS  60 mcg Subcutaneous Q Thu-1800   diphenhydrAMINE  50 mg Oral Once   feeding supplement (NEPRO CARB STEADY)  237 mL Oral BID BM   multivitamin  1 tablet Oral QHS   pantoprazole  40 mg Oral Daily   predniSONE  50 mg Oral Q6H   sodium chloride flush  3 mL Intravenous Q12H   sodium zirconium cyclosilicate  10 g Oral BID   sucroferric oxyhydroxide  500 mg Oral TID WC    Dialysis Orders: [facility] TTS 3:45 450/A1.5x 2K/2Ca EDW 85.7kg TDC  -Heparin 3000 units IV TIW  -No ESA -Calcitriol 1.5 MCG PO TIW   Assessment/Plan: 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 ID following. Blood Cx 6/16 MSSA, negative Cx on 6/17, 6/19. TDC removed 6/18. Completed 72 hour line holiday. TTE concerning for endocarditis. TEE cancelled d/t high risk factors. LS MRI with possible L5-S1 discitis. Temp cath placed 6/21, but poor function requiring exchange - see below. Will be getting IV Cefazolin 2g q HD x 6 weeks (until 11/04/22).  Dialysis Access: [facility] removed 6/18 - s/p line holiday -then temp line placed 6/21 with recurrent non-function issues despite exchange 6/26. Going for [facility] today with IR.  SBO - on CT Ab 6/19. Now resolved, surgery team has signed off. Hyperkalemia: K 6 - high, essentially poor dialysis over  the past week with non-functioning temp HD line. Getting Lokelma 10g prn - already given this AM. ESRD: Usual TTS schedule - has not been dialyzed since 6/27 due to line issues - overloaded and hyperkalemic today, needed BiPAP this AM. Will be dialyzed as soon  as line in place today, then again tomorrow for further fluid removal and to get back on usual schedule. Perm Access: S/p L AVF ligation in 2019. Last seen by Dr. Edilia Bo 01/2022 - limited mobility in R arm so planning for L arm graft but had not scheduled surgery yet.   Hypertension/volume: BP high, weights inaccurate - he is overloaded on exam - mild respiratory distress this AM. Needs fluid off with HD today.    Anemia of ESRD: Hgb 7.7 - continue Aranesp q Thursday - likely ^ next dose.   Metabolic bone disease: CorrCa ok, Phos high. Previously on Renvela as binder - stopped due to SBO, changed to Velphoro - will ^ dose to 2/meals.  Nutrition - Renal diet, Alb very low - continue supps. Dispo - possible CIR admission.  Ozzie Hoyle, PA-C 09/22/2022, 9:50 AM  [employer]

## 2022-09-22 NOTE — Progress Notes (Signed)
Progress Note   Patient: Ryan [name] ZOX:[id number] DOB: August 26, 1960 DOA: 09/07/2022     15 DOS: the patient was seen and examined on 09/22/2022   Brief hospital course: 62 y.o. male with a history of hypertension, hyperlipidemia, ESRD on hemodialysis.  Patient presented secondary to low back pain and was found to have evidence of L5-S1 discitis/osteomyelitis in addition to MSSA bacteremia.  ID consulted.  Workup significant for possible endocarditis. Patient also found to have a small bowel obstruction.   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   -Blood cultures positive for MSSA.   -ID consulted and pt is now on cefazolin IV with hemodialysis. Plan for 6 weeks of tx from 6/18  -Transthoracic echocardiogram significant for a large, rounded structure on the anterior leaflet of the mitral valve concerning for endocarditis. -Chest imaging was consistent with septic emboli -ID initially recommended  transesophageal echocardiogram, however TEE was deferred secondary to risk/benefit profile.  -CT surg was consulted and recommended non-surgical management of mitral valve endocarditis. -Later found to have evidence of septic emboli to brain. Case discussed with Neuro. Plan to cont abx -On 7/1, noted to have acute hypoxemic resp failure.CXR is concerning for new airspace opacity w/in LUL and retrocardiac LLL and new nodular density w/ central cavitation w/in the apical segment of LUL   ESRD on hemodialysis -Nephrology following   -Patient presented with a tunneled dialysis catheter which was removed on 6/18 secondary to bacteremia.   - Pt was s/p R femoral non-tunneled HD CVC 6/26, not functioning today. -Now s/p tunneled HD cath 7/1 -continue HD plans per Nephrology   Intractable hiccups -will continue with thorazine as needed    Small bowel obstruction -Patient without a bowel movement for several days initially with SBO noted on abd CT with transition point -General surgery was consulted.  -symptoms later improved and diet was advanced -GI later signed off as of 6/25   Primary hypertension -Atenolol on hold secondary to soft BP -BP currently very well controlled   Elevated troponin -Previously noted. No associated chest pain. Presumed secondary to demand ischemia.   Lung nodules -Noted on initial CT scan.  Initial recommendation for repeat CT scan of the chest in 18 to 24 months.  Repeat CT scan on 6/20 showed multiple new pulmonary nodules compatible with likely active infection and/or aspiration.   -given known endocarditis, most likely septic emboli to lungs -Repeat cxr 7/1 with concerns for cavitary PNA per above   Bladder wall thickening Noted incidentally on CT imaging.  No urinary symptoms and patient does not make urine secondary to ESRD. Unclear if this correlates with isolated infection or is related to overall infectious picture.  Hyperkalemia -Plan to undergo HD today  Hyponatremia  -anticipate tx with HD today       Subjective: Complained of increased sob this AM, concerned about fluid overload  Physical Exam: Vitals:   09/22/22 1525 09/22/22 1530 09/22/22 1535 09/22/22 1552  BP: 135/78 (!) 152/94 (!) 141/86   Pulse: 69 71 72 74  Resp: 18 18 18  (!) 22  Temp:      TempSrc:      SpO2: 99% 100% 100%   Weight:      Height:       General exam: Conversant, in no acute distress Respiratory system: Increased resp effort no audible wheezing Cardiovascular system: regular rhythm, s1-s2 Gastrointestinal system: Nondistended,  nontender, pos BS Central nervous system: No seizures, no tremors Extremities: No cyanosis, no joint deformities Skin: No rashes, no pallor Psychiatry: Affect normal // no auditory hallucinations   Data Reviewed:  Labs reviewed: Na 124, K 6.0, Cr 14.43, WBC  25.1  Family Communication: Pt in room, family at bedside  Disposition: Status is: Inpatient Remains inpatient appropriate because: Severity of illness  Planned Discharge Destination: Rehab     Author: Rickey Barbara, MD 09/22/2022 3:54 PM  For on call review [email or web address].

## 2022-09-22 NOTE — Procedures (Addendum)
  Procedure:  Left internal jugular  tunneled HD CVC placement Palindrome 23 to svc/ra jct Preprocedure diagnosis: The primary encounter diagnosis was Acute right-sided low back pain without sciatica. Diagnoses of Fever, unspecified fever cause and Small bowel obstruction (HCC) were also pertinent to this visit. Postprocedure diagnosis: same EBL:    minimal Complications:   none immediate  See full dictation in [employer].  Thora Lance MD Main # [phone] Pager  [phone] Mobile [phone]

## 2022-09-22 NOTE — Progress Notes (Signed)
Patient developed SOB with labored respirations and non-productive cough this am. Plan to start BiPAP and get CXR.

## 2022-09-22 NOTE — Consult Note (Signed)
NAME:  Ryan [name], MRN:  [id number], DOB:  18-Jan-1961, LOS: 15 ADMISSION DATE:  09/07/2022, CONSULTATION DATE:  7/1 REFERRING MD:  Dr. Rhona Leavens, CHIEF COMPLAINT:  SOB   History of Present Illness:  62 year old male with past medical history as below, which is significant for hypertension, hyperlipidemia, and ESRD on dialysis.  He presented most emergency department on 6/16 with complaints of low back pain.  Workup was unfortunately significant for L5-S1 discitis/osteomyelitis in addition MSSA bacteremia.  ID was consulted and has been managing antibiotics and infectious workup.  Patient has been treated with cefazolin IV. There is concern on transthoracic echocardiogram for a large rounded structure on the anterior leaflet of the mitral valve.  Chest and brain imaging unfortunately consistent with septic embolization to both areas.  The appropriate specialties were involved and all opted to continue with conservative treatment of antibiotics for now.  He had been getting dialyzed through tunneled right IJ catheter which was removed for line holiday.  This was replaced by temporary femoral access by IR, however, this access became inoperable complicating dialysis session on 6/29 which was unable to be done.  Since that time the patient has become progressively short of breath intermittently requiring BiPAP.  This was mostly felt to be due to volume overload having missed dialysis, however, chest x-ray did show some changes consistent with cavitary lesions.  Considering this and delay to dialysis for access reasons PCCM was consulted for consideration of ICU transfer and possibly intubation.  Pertinent  Medical History   has a past medical history of Allergy, Anemia, Blood transfusion without reported diagnosis, Dialysis patient (HCC), ESRD (end stage renal disease) (HCC), Family history of adverse reaction to anesthesia, Hyperlipidemia, and Hypertension.   Significant Hospital Events: Including  procedures, antibiotic start and stop dates in addition to other pertinent events     Interim History / Subjective:    Objective   Blood pressure (!) 145/84, pulse 76, temperature 98.1 F (36.7 C), temperature source Axillary, resp. rate 20, height 5\' 9"  (1.753 m), weight 81.2 kg, SpO2 100 %.    FiO2 (%):  [40 %-100 %] 40 %   Intake/Output Summary (Last 24 hours) at 09/22/2022 1720 Last data filed at 09/22/2022 1100 Gross per 24 hour  Intake 600 ml  Output 0 ml  Net 600 ml   Filed Weights   09/18/22 0806 09/19/22 0536 09/20/22 0019  Weight: 79.7 kg 82.3 kg 81.2 kg    Examination: General: Middle aged male of normal body habitus in NAD on BiPAP HENT: Normocephalic, atraumatic, PERRL Lungs: Bibasilar crackles, increased respiratory rate, no distress Cardiovascular: Regular rate and rhythm, no murmurs rubs gallops Abdomen: Soft, nontender, nondistended Extremities: No acute deformity, range of motion limitation, or edema Neuro: Alert, oriented, nonfocal  Resolved Hospital Problem list     Assessment & Plan:   Acute respiratory failure with hypoxia: O2 sats as low as 87%.  Requiring intermittent BiPAP for work of breathing.  Relatively acute onset the last 24 hours would be more indicative of pulmonary edema after being unable to receive hemodialysis.  There are cavitary changes on the chest x-ray which may be contributing. -Patient is going for dialysis here shortly -Hopefully his respiratory status will improve with HD, we will reevaluate him after -If he has not improved after dialysis we will consider transfer to the ICU for closer monitoring and repeat a CT of his chest. 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 -Management per infectious disease and hospitalists with IV Ancef -Status post line holiday -Cardiology and thoracic surgery have  been involved and prefer to hold off TEE/valve surgery currently  ESRD on dialysis -HD per nephrology -Trend chemistries   Best Practice (right click and "Reselect all SmartList Selections" daily)   Per primary  Labs   CBC: Recent Labs  Lab 09/18/22 0234 09/19/22 0424 09/20/22 0250 09/21/22 0513 09/22/22 0416  WBC 24.9* 27.2* 22.5* 24.6* 25.1*  HGB 8.9* 8.5* 7.8* 8.2* 7.7*  HCT 25.2* 24.9* 23.2* 23.6* 23.5*  MCV 79.7* 80.1 82.3 81.7 80.5  PLT 305 293 292 313 312    Basic Metabolic Panel: Recent Labs  Lab 09/16/22 0338 09/17/22 0346 09/18/22 0234 09/19/22 0424 09/20/22 0250 09/21/22 0513 09/21/22 1037 09/22/22 0416  NA 127*   < > 128* 128* 128* 127* 126* 124*  K 5.2*   < > 4.7 4.4 4.7 5.3* 5.4* 6.0*  CL 89*   < > 91* 90* 90* 89* 86* 87*  CO2 18*   < > 19* 23 22 22  20* 20*  GLUCOSE 98   < > 111* 130* 115* 104* 134* 129*  BUN 103*   < > 83* 66* 87* 104* 109* 119*  CREATININE 13.26*   < > 11.23* 9.57* 11.38* 13.26* 13.01* 14.43*  CALCIUM 8.0*   < > 8.0* 7.8* 7.8* 7.9* 8.0* 7.7*  PHOS 11.7*  --  8.8*  --   --   --  9.2*  --    < > = values in this interval not displayed.   GFR: Estimated Creatinine Clearance: 5.4 mL/min (A) (by C-G formula based on SCr of 14.43 mg/dL (H)). Recent Labs  Lab 09/19/22 0424 09/20/22 0250 09/21/22 0513 09/22/22 0416  WBC 27.2* 22.5* 24.6* 25.1*    Liver Function Tests: Recent Labs  Lab 09/19/22 0424 09/20/22 0250 09/21/22 0513 09/21/22 1037 09/22/22 0416  AST 41 52* 61*  --  59*  ALT 6 6 6   --  7  ALKPHOS 104 93 97  --  118  BILITOT 0.5 0.6 0.6  --  0.8  PROT 7.0 6.7 7.1  --  7.6  ALBUMIN 1.6* 1.5* 1.7* 1.7* 1.8*   No results for input(s): "LIPASE", "AMYLASE" in the last 168 hours. No results for input(s): "AMMONIA" in the last 168 hours.  ABG    Component Value Date/Time   HCO3 10.9 (L) 03/03/2016 2015   TCO2 28 08/27/2020 0658   ACIDBASEDEF 15.0 (H) 03/03/2016 2015   O2SAT 69.0 03/03/2016 2015      Coagulation Profile: No results for input(s): "INR", "PROTIME" in the last 168 hours.  Cardiac Enzymes: No results for input(s): "CKTOTAL", "CKMB", "CKMBINDEX", "TROPONINI" in the last 168 hours.  HbA1C: Hgb A1c MFr Bld  Date/Time Value Ref Range Status  09/19/2022 04:24 AM 6.3 (H) 4.8 - 5.6 % Final    Comment:    (NOTE)         Prediabetes: 5.7 - 6.4         Diabetes: >6.4         Glycemic control for adults with diabetes: <7.0     CBG: No results for input(s): "GLUCAP" in the last 168 hours.  Review of Systems:   Bolds are positive  Constitutional: weight loss, gain, night sweats, Fevers, chills, fatigue .  HEENT: headaches, Sore throat, sneezing, nasal congestion, post nasal drip, Difficulty swallowing, Tooth/dental problems, visual complaints visual changes,  ear ache CV:  chest pain, radiates:,Orthopnea, PND, swelling in lower extremities, dizziness, palpitations, syncope.  GI  heartburn, indigestion, abdominal pain, nausea, vomiting, diarrhea, change in bowel habits, loss of appetite, bloody stools.  Resp: cough, productive: , hemoptysis, dyspnea, chest pain, pleuritic.  Skin: rash or itching or icterus GU: dysuria, change in color of urine, urgency or frequency. flank pain, hematuria  MS: joint pain or swelling. decreased range of motion  Psych: change in mood or affect. depression or anxiety.  Neuro: difficulty with speech, weakness, numbness, ataxia    Past Medical History:  He,  has a past medical history of Allergy, Anemia, Blood transfusion without reported diagnosis, Dialysis patient (HCC), ESRD (end stage renal disease) (HCC), Family history of adverse reaction to anesthesia, Hyperlipidemia, and Hypertension.   Surgical History:   Past Surgical History:  Procedure Laterality Date   A/V FISTULAGRAM Left 12/15/2016   Procedure: A/V Fistulagram - left;  Surgeon: Chuck Hint, MD;  Location: [facility] INVASIVE CV LAB;  Service: Cardiovascular;  Laterality:  Left;   AV FISTULA PLACEMENT Left 08/28/2016   Procedure: LEFT UPPER  ARM ARTERIOVENOUS (AV) FISTULA CREATION;  Surgeon: Chuck Hint, MD;  Location: [facility] OR;  Service: Vascular;  Laterality: Left;   DIALYSIS/PERMA CATHETER INSERTION Right 12/21/2017   Procedure: INSERTION OF DIALYSIS CATHETER Right Internal Jugular .;  Surgeon: Sherren Kerns, MD;  Location: [facility] OR;  Service: Vascular;  Laterality: Right;   FISTULA SUPERFICIALIZATION Left 09/23/2017   Procedure: FISTULA PLICATION LEFT ARM;  Surgeon: Sherren Kerns, MD;  Location[facility] OR;  Service: Vascular;  Laterality: Left;   FISTULOGRAM Left 12/21/2017   Procedure: FISTULOGRAM with Balloon Angioplasty.;  Surgeon: Sherren Kerns, MD;  Location: [facility] OR;  Service: Vascular;  Laterality: Left;   HEMATOMA EVACUATION Left 08/27/2020   Procedure: EVACUATION HEMATOMA LEFT ARM;  Surgeon: Sherren Kerns, MD;  Location: [facility] OR;  Service: Vascular;  Laterality: Left;   IR FLUORO GUIDE CV LINE LEFT  09/22/2022   IR FLUORO GUIDE CV LINE RIGHT  09/12/2022   IR FLUORO GUIDE CV LINE RIGHT  09/17/2022   IR REMOVAL TUN CV CATH W/O FL  09/09/2022   IR THORACENTESIS ASP PLEURAL SPACE W/IMG GUIDE  08/22/2017   1.2 L -right-sided   IR THORACENTESIS ASP PLEURAL SPACE W/IMG GUIDE  10/19/2017   IR US GUIDE VASC ACCESS LEFT  09/22/2022   IR US GUIDE VASC ACCESS RIGHT  09/12/2022   KIDNEY TRANSPLANT     REVISON OF ARTERIOVENOUS FISTULA Left 12/21/2017   Procedure: PLICATION and Ligation of F LEFT ARM ARTERIOVENOUS FISTULA;  Surgeon: Sherren Kerns, MD;  Location: [facility] OR;  Service: Vascular;  Laterality: Left;   REVISON OF ARTERIOVENOUS FISTULA Left 07/16/2020   Procedure: EXCISION OF LEFT ARM ARTERIOVENOUS FISTULA;  Surgeon: Sherren Kerns, MD;  Location: [facility] OR;  Service: Vascular;  Laterality: Left;   stent in kidneys     dec 2017   TRANSTHORACIC ECHOCARDIOGRAM  09/22/2017    Severe LVH.  Normal function -EF 55-60%.  GRII DD.  Moderate RV dilation  with mildly reduced RV function.   Fixed right coronary cusp with very mild aortic stenosis.  The myocardium has a speckled appearance --> .   Recommend cardiac MRI to evaluate for amyloid   UPPER EXTREMITY VENOGRAPHY Bilateral 06/01/2020   Procedure: UPPER EXTREMITY VENOGRAPHY;  Surgeon: Chuck Hint, MD;  Location: [facility] INVASIVE CV LAB;  Service: Cardiovascular;  Laterality: Bilateral;     Social  History:   reports that he has never smoked. He has never used smokeless tobacco. He reports that he does not drink alcohol and does not use drugs.   Family History:  His family history includes Alcohol abuse in his maternal grandfather; Cancer in his father; Heart disease in his maternal grandmother; Heart disease (age of onset: 49) in his mother; Hypertension in his mother and sister; Kidney cancer in his father; Learning disabilities in his paternal grandmother; Mental illness in his paternal grandmother; Stroke in his mother and paternal grandfather. There is no history of Colon cancer, Colon polyps, Esophageal cancer, Rectal cancer, or Stomach cancer.   Allergies Allergies  Allergen Reactions   Azithromycin Nausea And Vomiting and Other (See Comments)    Chest tightness  Other Reaction(s): GI Intolerance   Iodinated Contrast Media Itching and Nausea Only    Probably needs prednisone prep prior to contrast     Home Medications  Prior to Admission medications   Medication Sig Start Date End Date Taking? Authorizing Provider  atenolol (TENORMIN) 25 MG tablet Take 50 mg by mouth daily.   Yes [provider]  atorvastatin (LIPITOR) 40 MG tablet Take 40 mg by mouth daily.   Yes [provider]  cyclobenzaprine (FLEXERIL) 10 MG tablet Take 1 tablet (10 mg total) by mouth 2 (two) times daily as needed for muscle spasms. 01/15/22  Yes Carroll Sage, PA-C  EPINEPHrine 0.3 mg/0.3 mL IJ SOAJ injection Inject 0.3 mg into the muscle as needed for anaphylaxis. 06/08/21  Yes  Redwine, Madison A, PA-C  loratadine (CLARITIN) 10 MG tablet Take 10 mg by mouth daily.   Yes [provider]  multivitamin (RENA-VIT) TABS tablet Take 1 tablet by mouth daily.   Yes [provider]  sevelamer carbonate (RENVELA) 800 MG tablet Take 1 tablet (800 mg total) by mouth 3 (three) times daily with meals. Patient taking differently: Take 800-1,600 mg by mouth See admin instructions. Take 2 tablets by mouth with each meal and 1 tablet with snacks 09/24/17  Yes Sheikh, Omair Latif, DO  tetrahydrozoline-zinc (VISINE-AC) 0.05-0.25 % ophthalmic solution Place 2 drops into both eyes 3 (three) times daily as needed (dry eyes).   Yes [provider]  triamcinolone cream (KENALOG) 0.1 % Apply 1 application topically daily as needed (dry spots on face/excema).   Yes [provider]  ceFAZolin (ANCEF) IVPB Inject 2 g into the vein Every Tuesday,Thursday,and Saturday with dialysis. Indication:  MSSA discitis/osteo First Dose: Yes Last Day of Therapy:  11/04/22 Labs - Once weekly:  CBC/D and BMP, ESR and CRP Method of administration: Per HD protocol Method of administration may be changed at the discretion of home infusion pharmacist based upon assessment of the patient and/or caregiver's ability to self-administer the medication ordered. 09/16/22 11/04/22  Danelle Earthly, MD  Cetirizine HCl 10 MG CAPS Take 1 capsule (10 mg total) by mouth 1 day or 1 dose. Patient not taking: Reported on 09/08/2022 06/08/21 09/07/22  Redwine, Madison A, PA-C  Cholecalciferol (VITAMIN D3) 125 MCG (5000 UT) CAPS Take 5,000 Units by mouth daily. Patient not taking: Reported on 09/08/2022    [provider]  cinacalcet (SENSIPAR) 30 MG tablet Take 30 mg by mouth at bedtime. Patient not taking: Reported on 09/08/2022 12/08/17   [provider]  Ferrous Sulfate (IRON) 325 (65 Fe) MG TABS Take 325 mg by mouth at bedtime. Patient not taking: Reported on 09/08/2022    [provider]  OVER THE COUNTER  MEDICATION Take 1 tablet by mouth daily. Zinc and Copper Patient not taking: Reported on 09/08/2022    [provider]     Critical care time:      Joneen Roach, AGACNP-BC [facility]  See Amion for personal pager PCCM on call pager [phone] until 7pm. Please call Elink 7p-7a. [phone]  09/22/2022 6:20 PM

## 2022-09-22 NOTE — Progress Notes (Signed)
Inpatient Rehab Admissions Coordinator:   Events noted.  Still awaiting TDC placement, which appears will take place today.  Will continue to follow for potential CIR admit.   Estill Dooms, PT, DPT Admissions Coordinator [phone] 09/22/22  10:19 AM

## 2022-09-22 NOTE — Progress Notes (Signed)
Pt has arrived to the unit from 5W  He has been placed on telemetry Called to the tele dept Report was given and I will turn over info to oncoming staff nurse Pt has been placed on bipap Weight is 81.8kg  Height 5'9 Pt is NPO as per orders for IR procedure today Bed in proper position Bed alarm In use Callbell/belongings within reach

## 2022-09-22 NOTE — Progress Notes (Signed)
Patient was taken to IR on BIPAP without any complications. Patient was transported back to 3E05 on BIPAP after procedure was complete without any complications.

## 2022-09-23 LAB — RENAL FUNCTION PANEL
Albumin: 1.7 g/dL — ABNORMAL LOW (ref 3.5–5.0)
Anion gap: 18 — ABNORMAL HIGH (ref 5–15)
BUN: 55 mg/dL — ABNORMAL HIGH (ref 8–23)
CO2: 20 mmol/L — ABNORMAL LOW (ref 22–32)
Calcium: 7.7 mg/dL — ABNORMAL LOW (ref 8.9–10.3)
Chloride: 92 mmol/L — ABNORMAL LOW (ref 98–111)
Creatinine, Ser: 7.7 mg/dL — ABNORMAL HIGH (ref 0.61–1.24)
GFR, Estimated: 7 mL/min — ABNORMAL LOW (ref >60)
Glucose, Bld: 111 mg/dL — ABNORMAL HIGH (ref 70–99)
Phosphorus: 7.5 mg/dL — ABNORMAL HIGH (ref 2.5–4.6)
Potassium: 4.3 mmol/L (ref 3.5–5.1)
Sodium: 130 mmol/L — ABNORMAL LOW (ref 135–145)

## 2022-09-23 LAB — CBC
HCT: 22.6 % — ABNORMAL LOW (ref 39.0–52.0)
Hemoglobin: 7.6 g/dL — ABNORMAL LOW (ref 13.0–17.0)
MCH: 26.6 pg (ref 26.0–34.0)
MCHC: 33.6 g/dL (ref 30.0–36.0)
MCV: 79 fL — ABNORMAL LOW (ref 80.0–100.0)
Platelets: 285 10*3/uL (ref 150–400)
RBC: 2.86 MIL/uL — ABNORMAL LOW (ref 4.22–5.81)
RDW: 15.4 % (ref 11.5–15.5)
WBC: 17.5 10*3/uL — ABNORMAL HIGH (ref 4.0–10.5)
nRBC: 0 % (ref 0.0–0.2)

## 2022-09-23 MED ORDER — DARBEPOETIN ALFA 100 MCG/0.5ML IJ SOSY
100.0000 ug | PREFILLED_SYRINGE | INTRAMUSCULAR | Status: DC
  Filled 2022-09-23: qty 0.5

## 2022-09-23 MED ORDER — HEPARIN SODIUM (PORCINE) 1000 UNIT/ML IJ SOLN
INTRAMUSCULAR | Status: AC
  Filled 2022-09-23: qty 4

## 2022-09-23 MED ORDER — ALBUMIN HUMAN 25 % IV SOLN
INTRAVENOUS | Status: AC
  Administered 2022-09-23: 25 g
  Filled 2022-09-23: qty 100

## 2022-09-23 NOTE — Progress Notes (Signed)
Physical Therapy Treatment Patient Details Name: Ryan [name] MRN: [id number] DOB: Jun 11, 1960 Today's Date: 09/23/2022   History of Present Illness Pt is a 62 y/o male admitted secondary to acute back pain and found to have L5-S1 discitis/osteomyelitis, SBO and MSSA bacteremia likely 2/2 line infection. Pt had HD cath replaced with rt femoral temp cath. 09/22/22 Hemodialysis Catheter Left Subclavian Double lumen Permanent (Tunneled). 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  PMH including but not limited to hypertension, hyperlipidemia, ESRD.    PT Comments  Pt received sitting in the recliner and agreeable to session. Pt reporting feeling much better today and demonstrates good progress towards functional mobility goals. Pt able to tolerate increased gait distance with min guard for safety, but no LOB. Pt requiring 2 standing rest breaks due to fatigue and is able to stand without UE support with no LOB. Pt set up with lunch at end of session and is noted to begin coughing when eating. Pt provided a drink and RN notified. Pt continues to benefit from PT services to progress toward functional mobility goals.     Assistance Recommended at Discharge Frequent or constant Supervision/Assistance  If plan is discharge home, recommend the following:  Can travel by private vehicle    Help with stairs or ramp for entrance;Assist for transportation;Assistance with cooking/housework;A little help with bathing/dressing/bathroom;A lot of help with walking and/or transfers      Equipment Recommendations  Rolling walker (2 wheels)    Recommendations for Other Services       Precautions / Restrictions Precautions Precautions: Fall Restrictions Weight Bearing Restrictions: No     Mobility  Bed Mobility               General bed mobility comments: Pt beginning and ending session in recliner    Transfers Overall transfer level:  Needs assistance Equipment used: Rolling walker (2 wheels) Transfers: Sit to/from Stand Sit to Stand: Min guard           General transfer comment: From recliner with min guard for safety and cues for hand placement    Ambulation/Gait Ambulation/Gait assistance: Min guard Gait Distance (Feet): 65 Feet Assistive device: Rolling walker (2 wheels) Gait Pattern/deviations: Step-through pattern, Trunk flexed       General Gait Details: Pt demonstrating a slow step-through pattern with cues for upright posture. 2 standing rest breaks due to fatigue.        Balance Overall balance assessment: Needs assistance Sitting-balance support: No upper extremity supported Sitting balance-Leahy Scale: Fair Sitting balance - Comments: sitting in recliner   Standing balance support: Bilateral upper extremity supported, Reliant on assistive device for balance, During functional activity Standing balance-Leahy Scale: Fair Standing balance comment: with RW support. Pt able to static stand without UE with no LOB                            Cognition Arousal/Alertness: Awake/alert Behavior During Therapy: WFL for tasks assessed/performed Overall Cognitive Status: Within Functional Limits for tasks assessed                                          Exercises      General Comments General comments (skin integrity, edema, etc.): VSS on 4L O2 throughout session      Pertinent Vitals/Pain  Pain Assessment Pain Assessment: Faces Faces Pain Scale: Hurts a little bit Pain Location: Lower Back Pain Descriptors / Indicators: Sore Pain Intervention(s): Monitored during session     PT Goals (current goals can now be found in the care plan section) Acute Rehab PT Goals Patient Stated Goal: return to independence PT Goal Formulation: With patient Time For Goal Achievement: 09/25/22 Potential to Achieve Goals: Good Progress towards PT goals: Progressing toward  goals    Frequency    Min 3X/week      PT Plan Current plan remains appropriate       AM-PAC PT "6 Clicks" Mobility   Outcome Measure  Help needed turning from your back to your side while in a flat bed without using bedrails?: A Little Help needed moving from lying on your back to sitting on the side of a flat bed without using bedrails?: A Little Help needed moving to and from a bed to a chair (including a wheelchair)?: A Little Help needed standing up from a chair using your arms (e.g., wheelchair or bedside chair)?: A Little Help needed to walk in hospital room?: A Little Help needed climbing 3-5 steps with a railing? : A Lot 6 Click Score: 17    End of Session Equipment Utilized During Treatment: Gait belt Activity Tolerance: Patient tolerated treatment well Patient left: in chair;with chair alarm set;with call bell/phone within reach Nurse Communication: Mobility status PT Visit Diagnosis: Other abnormalities of gait and mobility (R26.89);Pain     Time: 5621-3086 PT Time Calculation (min) (ACUTE ONLY): 23 min  Charges:    $Gait Training: 23-37 mins PT General Charges $$ ACUTE PT VISIT: 1 Visit                    Johny Shock, PTA Acute Rehabilitation Services Secure Chat Preferred  Office:([phone]    Johny Shock 09/23/2022, 2:00 PM

## 2022-09-23 NOTE — Progress Notes (Signed)
Received patient in bed to unit. yes Alert and oriented. Caox4 Informed consent signed and in chart. yes  TX duration:3 hours 45 minutes  Patient tolerated well. Yes Transported back to the room done at bedside  Alert, without acute distress. yes Hand-off given to patient's nurse. Charge nurse  Access used: left internal jugular permcath Access issues: none  Total UF removed: 3000 Medication(s) given: 0 Post HD VS: 104/73 hr 83 Post HD weight: 80.9kg   [facility]

## 2022-09-23 NOTE — Progress Notes (Signed)
Nutrition Follow-up  DOCUMENTATION CODES:   Non-severe (moderate) malnutrition in context of acute illness/injury  INTERVENTION:  Liberalize diet to regular in setting of malnutrition and inadequate nutritional intake since prior to admission Chopped meats and sauces/gravies with lunch and dinner meals for added moisture to meats/grains Nepro Shake po BID, each supplement provides 425 kcal and 19 grams protein Discontinue ProSource Plus Magic cup TID with meals, each supplement provides 290 kcal and 9 grams of protein Renal MVI with minerals daily  NUTRITION DIAGNOSIS:   Moderate Malnutrition related to acute illness (discitis/osteomyelitis, SBO) as evidenced by mild fat depletion, mild muscle depletion, moderate muscle depletion. - diagnosis updated 7/2  GOAL:   Patient will meet greater than or equal to 90% of their needs - goal unmet  MONITOR:   PO intake, Labs, Weight trends, Diet advancement, I & O's  REASON FOR ASSESSMENT:   Malnutrition Screening Tool    ASSESSMENT:   62 y.o. male admits related to low back pain. PMH includes: HTN, HLDm ESRD on HD. Pt is currently receiving medical management related to L5-S1 discitis/osteomyelitis. Pt is also being followed by General Surgery related to SBO.  7/1 - s/p new TDC placement following line holiday  Repeat cxr yesterday- cardiomegaly, cavitary lesion in LUL concerning for septic emboli  Pt sitting up in chair, just finished working with OT. Pt is in good spirits at time of visit. He does not follow any specific diet at home. He was eating well up until about 1 week PTA when he became more lethargic and tired and stopped eating 4 days PTA.   During admission, pt has remained with limited intake d/t SBO and respiratory status. He recalls not enjoying taste of foods. He avoids rice and chicken d/t dryness. Will try to add sauces/gravies to help add moisture. He enjoys nepro shakes and has been consuming prosource but prefers  to try other supplements if able as he does not particularly enjoy these.   Last HD 7/1: 3L net UF  Post HD weight: 80.9 kg EDW: 85.7 kg Pre HD weight today: 77.8 kg  Edema: mild pitting generalized, RUE, BLE  Per Nephrology, pt with volume overload 2/2 poor HD tolerance this week d/t non-functioning temp HD line. Now s/p [facility] placement, able to receive consistent HD sessions.  Discussed diet liberalization and addition of nutrition supplements with patient in setting of malnutrition, inability to maintain consistent and adequate nutritional intake for > 7 days and need for increased calories and protein for muscle maintenance/general health. Encouraged family to provide food from home for patient if this will help with nutritional adequacy.   Medications: calcitriol, sensipar, rena-vit, protonix, velphoro  Labs: sodium 130, BUN 55, Cr 7.70, anion gap 18, Phos 7.5, GFR 7  NUTRITION - FOCUSED PHYSICAL EXAM:  Flowsheet Row Most Recent Value  Orbital Region Mild depletion  Upper Arm Region Moderate depletion  Thoracic and Lumbar Region Mild depletion  Buccal Region No depletion  Temple Region Mild depletion  Clavicle Bone Region No depletion  Clavicle and Acromion Bone Region Moderate depletion  Scapular Bone Region Mild depletion  Dorsal Hand Mild depletion  Patellar Region Severe depletion  Anterior Thigh Region Severe depletion  Posterior Calf Region No depletion  Edema (RD Assessment) Mild  [BLE]  Hair Reviewed  Eyes Reviewed  Mouth Reviewed  Skin Reviewed  Nails Reviewed       Diet Order:   Diet Order             Diet  regular Room service appropriate? Yes; Fluid consistency: Thin; Fluid restriction: 1200 mL Fluid  Diet effective now                   EDUCATION NEEDS:   Education needs have been addressed  Skin:  Skin Assessment: Reviewed RN Assessment  Last BM:  7/1  Height:   Ht Readings from Last 1 Encounters:  09/07/22 5\' 9"  (1.753 m)     Weight:   Wt Readings from Last 1 Encounters:  09/23/22 77.8 kg   BMI:  Body mass index is 25.33 kg/m.  Estimated Nutritional Needs:   Kcal:  2300-2500  Protein:  115-130g  Fluid:  1L + UOP  Drusilla Kanner, RDN, LDN Clinical Nutrition

## 2022-09-23 NOTE — Progress Notes (Signed)
Progress Note   Patient: Ryan [name] ZOX:[id number] DOB: 07-Jan-1961 DOA: 09/07/2022     16 DOS: the patient was seen and examined on 09/23/2022   Brief hospital course: 62 y.o. male with a history of hypertension, hyperlipidemia, ESRD on hemodialysis.  Patient presented secondary to low back pain and was found to have evidence of L5-S1 discitis/osteomyelitis in addition to MSSA bacteremia.  ID consulted.  Workup significant for possible endocarditis. Patient also found to have a small bowel obstruction.   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   -Blood cultures positive for MSSA.   -ID consulted and pt is now on cefazolin IV with hemodialysis. Plan for 6 weeks of tx from 6/18  -Transthoracic echocardiogram significant for a large, rounded structure on the anterior leaflet of the mitral valve concerning for endocarditis. -Chest imaging was consistent with septic emboli -ID initially recommended  transesophageal echocardiogram, however TEE was deferred secondary to risk/benefit profile.  -CT surg was consulted and recommended non-surgical management of mitral valve endocarditis. -Later found to have evidence of septic emboli to brain. Case discussed with Neuro. Plan to cont abx -On 7/1, noted to have acute hypoxemic resp failure.CXR is concerning for new airspace opacity w/in LUL and retrocardiac LLL and new nodular density w/ central cavitation w/in the apical segment of LUL. Appreciate PCCM input. Plan to continue antibiotics   ESRD on hemodialysis -Nephrology following   -Patient presented with a tunneled dialysis catheter which was removed on 6/18 secondary to bacteremia.   - Pt was s/p R femoral non-tunneled HD CVC 6/26, found not to be functioning over weekend -Now s/p tunneled HD cath 7/1 -continue HD plans per  Nephrology -Clinically improved with HD   Intractable hiccups -will continue with thorazine as needed   Small bowel obstruction -Patient without a bowel movement for several days initially with SBO noted on abd CT with transition point -General surgery was consulted.  -symptoms later improved and diet was advanced -GI later signed off as of 6/25   Primary hypertension -Atenolol on hold secondary to soft BP -BP currently very well controlled   Elevated troponin -Previously noted. No associated chest pain. Presumed secondary to demand ischemia.   Lung nodules -Noted on initial CT scan.  Initial recommendation for repeat CT scan of the chest in 18 to 24 months.  Repeat CT scan on 6/20 showed multiple new pulmonary nodules compatible with likely active infection and/or aspiration.   -given known endocarditis, most likely septic emboli to lungs -Repeat cxr 7/1 with concerns for cavitary PNA per above. Continue abx per above   Bladder wall thickening Noted incidentally on CT imaging.  No urinary symptoms and patient does not make urine secondary to ESRD. Unclear if this correlates with isolated infection or is related to overall infectious picture.  Hyperkalemia -Continue management via HD  Hyponatremia  -Improved to 130 -recheck bmet in AM      Subjective: Reports breathing better today. Improved after HD last ngiht  Physical Exam: Vitals:   09/23/22 1714 09/23/22 1730 09/23/22 1804 09/23/22 1816  BP: 90/61 105/60 105/61 116/62  Pulse: 77 74 80 81  Resp: 18 18 15  (!) 23  Temp:      TempSrc:      SpO2: 100% 100% 100% 100%  Weight:    74.8 kg  Height:       General exam: Awake, laying in bed,  in nad Respiratory system: Normal respiratory effort, no wheezing Cardiovascular system: regular rate, s1, s2 Gastrointestinal system: Soft, nondistended, positive BS Central nervous system: CN2-12 grossly intact, strength intact Extremities: Perfused, no clubbing Skin: Normal  skin turgor, no notable skin lesions seen Psychiatry: Mood normal // no visual hallucinations   Data Reviewed:  Labs reviewed: Na 130, K 4.3, Cr 7.70, Hgb 7.6  Family Communication: Pt in room, family at bedside  Disposition: Status is: Inpatient Remains inpatient appropriate because: Severity of illness  Planned Discharge Destination: Rehab     Author: Rickey Barbara, MD 09/23/2022 6:34 PM  For on call review [email or web address].

## 2022-09-23 NOTE — Progress Notes (Signed)
Received patient in bed.Awake,alert and oriented x 4.  Medicines given. Albumin 25 g.                             Tylenol 650 mg.                              Heparin 3,000 units pre-run dose.  Access used : Left HD catheter that was inserted yesterday,worked well..Dressing changed done today.  Medicine given: Heparin 3,000 units pre-run dose.  Duration of treatment. 3.5 hours.  Fluid removed : 3 liters.  Hemo comment : Tolerated treatment well.  Hand off to the patient's nurse.

## 2022-09-23 NOTE — Progress Notes (Addendum)
[facility] Progress Note   Subjective:  Seen in room - got TDC and HD yesterday evening. Breathing a little better today - off BiPAP and back on [place] O2. No CP or abd pain. For HD again today.  Objective Vitals:   09/23/22 0405 09/23/22 0415 09/23/22 0801 09/23/22 0826  BP:  105/63 112/62 125/74  Pulse: 67 66 66 72  Resp: 16 15 15  (!) 22  Temp:  97.8 F (36.6 C) (!) 97.4 F (36.3 C)   TempSrc:  Axillary Oral   SpO2: 99% 100% 99% 96%  Weight:  80.1 kg    Height:       Physical Exam General: Well appearing man, NAD. [place] O2 in place. Heart: RRR; no murmur Lungs: Bibasilar rales Abdomen: soft Extremities: no LE edema Dialysis Access:  none  Additional Objective Labs: Basic Metabolic Panel: Recent Labs  Lab 09/18/22 0234 09/19/22 0424 09/21/22 1037 09/22/22 0416 09/23/22 0105  NA 128*   < > 126* 124* 130*  K 4.7   < > 5.4* 6.0* 4.3  CL 91*   < > 86* 87* 92*  CO2 19*   < > 20* 20* 20*  GLUCOSE 111*   < > 134* 129* 111*  BUN 83*   < > 109* 119* 55*  CREATININE 11.23*   < > 13.01* 14.43* 7.70*  CALCIUM 8.0*   < > 8.0* 7.7* 7.7*  PHOS 8.8*  --  9.2*  --  7.5*   < > = values in this interval not displayed.   Liver Function Tests: Recent Labs  Lab 09/20/22 0250 09/21/22 0513 09/21/22 1037 09/22/22 0416 09/23/22 0105  AST 52* 61*  --  59*  --   ALT 6 6  --  7  --   ALKPHOS 93 97  --  118  --   BILITOT 0.6 0.6  --  0.8  --   PROT 6.7 7.1  --  7.6  --   ALBUMIN 1.5* 1.7* 1.7* 1.8* 1.7*   CBC: Recent Labs  Lab 09/19/22 0424 09/20/22 0250 09/21/22 0513 09/22/22 0416 09/23/22 0105  WBC 27.2* 22.5* 24.6* 25.1* 17.5*  HGB 8.5* 7.8* 8.2* 7.7* 7.6*  HCT 24.9* 23.2* 23.6* 23.5* 22.6*  MCV 80.1 82.3 81.7 80.5 79.0*  PLT 293 292 313 312 285   Studies/Results: IR Fluoro Guide CV Line Left  Result Date: 09/22/2022 CLINICAL DATA:  Renal insufficiency, needs durable venous access for hemodialysis EXAM: TUNNELED HEMODIALYSIS CATHETER PLACEMENT WITH  ULTRASOUND AND FLUOROSCOPIC GUIDANCE TECHNIQUE: The procedure, risks, benefits, and alternatives were explained to the patient. Questions regarding the procedure were encouraged and answered. The patient understands and consents to the procedure. As antibiotic prophylaxis, cefazolin 2 g was ordered pre-procedure and administered intravenously within one hour of incision.Patency of the left IJ vein was confirmed with ultrasound with image documentation. An appropriate skin site was determined. Region was prepped using maximum barrier technique including cap and mask, sterile gown, sterile gloves, large sterile sheet, and Chlorhexidine as cutaneous antisepsis. The region was infiltrated locally with 1% lidocaine. Intravenous Fentanyl and Versed 1.5mg  were administered as conscious sedation during continuous monitoring of the patient's level of consciousness and physiological / cardiorespiratory status by the radiology RN, with a total moderate sedation time of 20 minutes. Under real-time ultrasound guidance, the left IJ vein was accessed with a 21 gauge micropuncture needle; the needle tip within the vein was confirmed with ultrasound image documentation. Needle exchanged over the 018 guidewire for transitional  dilator. Limited venogram performed demonstrating central venous patency. Benson wire was advanced into the IVC. Over this, an MPA catheter was advanced. A Palindrome 23 hemodialysis catheter was tunneled from the left anterior chest wall approach to the left IJ dermatotomy site. The MPA catheter was exchanged over an Amplatz wire for serial vascular dilators which allow placement of a peel-away sheath, through which the catheter was advanced under intermittent fluoroscopy, positioned with its tips in the proximal and midright atrium. Spot chest radiograph confirms good catheter position. No pneumothorax. Catheter was flushed and primed per protocol. Catheter secured externally with O Prolene sutures. The  left IJ dermatotomy site was closed with Dermabond. The temporary right femoral hemodialysis catheter was removed and hemostasis achieved at the site with manual compression. The patient tolerated the procedure well. COMPLICATIONS: COMPLICATIONS None immediate FLUOROSCOPY: Radiation Exposure Index (as provided by the fluoroscopic device): 77 mGy air Kerma COMPARISON:  None Available. IMPRESSION: 1. Technically successful placement of tunneled left IJ hemodialysis catheter with ultrasound and fluoroscopic guidance. Ready for routine use. ACCESS: Remains approachable for percutaneous intervention as needed. Electronically Signed   By: Corlis Leak M.D.   On: 09/22/2022 16:15   IR US Guide Vasc Access Left  Result Date: 09/22/2022 CLINICAL DATA:  Renal insufficiency, needs durable venous access for hemodialysis EXAM: TUNNELED HEMODIALYSIS CATHETER PLACEMENT WITH ULTRASOUND AND FLUOROSCOPIC GUIDANCE TECHNIQUE: The procedure, risks, benefits, and alternatives were explained to the patient. Questions regarding the procedure were encouraged and answered. The patient understands and consents to the procedure. As antibiotic prophylaxis, cefazolin 2 g was ordered pre-procedure and administered intravenously within one hour of incision.Patency of the left IJ vein was confirmed with ultrasound with image documentation. An appropriate skin site was determined. Region was prepped using maximum barrier technique including cap and mask, sterile gown, sterile gloves, large sterile sheet, and Chlorhexidine as cutaneous antisepsis. The region was infiltrated locally with 1% lidocaine. Intravenous Fentanyl and Versed 1.5mg  were administered as conscious sedation during continuous monitoring of the patient's level of consciousness and physiological / cardiorespiratory status by the radiology RN, with a total moderate sedation time of 20 minutes. Under real-time ultrasound guidance, the left IJ vein was accessed with a 21 gauge  micropuncture needle; the needle tip within the vein was confirmed with ultrasound image documentation. Needle exchanged over the 018 guidewire for transitional dilator. Limited venogram performed demonstrating central venous patency. Benson wire was advanced into the IVC. Over this, an MPA catheter was advanced. A Palindrome 23 hemodialysis catheter was tunneled from the left anterior chest wall approach to the left IJ dermatotomy site. The MPA catheter was exchanged over an Amplatz wire for serial vascular dilators which allow placement of a peel-away sheath, through which the catheter was advanced under intermittent fluoroscopy, positioned with its tips in the proximal and midright atrium. Spot chest radiograph confirms good catheter position. No pneumothorax. Catheter was flushed and primed per protocol. Catheter secured externally with O Prolene sutures. The left IJ dermatotomy site was closed with Dermabond. The temporary right femoral hemodialysis catheter was removed and hemostasis achieved at the site with manual compression. The patient tolerated the procedure well. COMPLICATIONS: COMPLICATIONS None immediate FLUOROSCOPY: Radiation Exposure Index (as provided by the fluoroscopic device): 77 mGy air Kerma COMPARISON:  None Available. IMPRESSION: 1. Technically successful placement of tunneled left IJ hemodialysis catheter with ultrasound and fluoroscopic guidance. Ready for routine use. ACCESS: Remains approachable for percutaneous intervention as needed. Electronically Signed   By: Ronald Pippins.D.  On: 09/22/2022 16:15   DG CHEST PORT 1 VIEW  Result Date: 09/22/2022 CLINICAL DATA:  Acute respiratory distress. History of end-stage renal disease. EXAM: PORTABLE CHEST 1 VIEW COMPARISON:  CT 09/07/2022 FINDINGS: There is enlargement of the cardiac silhouette which is favored to represent artifact from portable technique, although underlying pericardial effusion would be difficult to exclude. Lung volumes  are low. There is new opacification of the retrocardiac left lower lobe. New airspace opacity is identified within the left upper lobe. New nodular density with central cavitation is suspected within the apical segment of the left upper lobe measuring 1.2 cm. The right lung appears relatively clear. IMPRESSION: 1. New airspace opacity within the left upper lobe and retrocardiac left lower lobe concerning for pneumonia. 2. New nodular density with central cavitation within the apical segment of the left upper lobe. Consider follow-up CT of the chest for more definitive characterization. 3. Enlargement of the cardiac silhouette compared with previous exam may reflect artifact from AP portable technique. Underlying pericardial effusion would be difficult to exclude. Electronically Signed   By: Signa Kell M.D.   On: 09/22/2022 07:17    Medications:   ceFAZolin (ANCEF) IV 2 g (09/20/22 1902)   methocarbamol (ROBAXIN) IV 110 mL/hr at 09/20/22 1600    (feeding supplement) PROSource Plus  30 mL Oral BID BM   atenolol  12.5 mg Oral Daily   calcitRIOL  0.5 mcg Oral Q T,Th,Sat-1800   Chlorhexidine Gluconate Cloth  6 each Topical Q0600   cinacalcet  30 mg Oral Q supper   darbepoetin (ARANESP) injection - DIALYSIS  60 mcg Subcutaneous Q Thu-1800   feeding supplement (NEPRO CARB STEADY)  237 mL Oral BID BM   heparin  3,000 Units Dialysis Once in dialysis   heparin  3,000 Units Dialysis Once in dialysis   multivitamin  1 tablet Oral QHS   pantoprazole  40 mg Oral Daily   sodium chloride flush  3 mL Intravenous Q12H   sucroferric oxyhydroxide  1,000 mg Oral TID WC    Dialysis Orders: [facility] TTS 3:45 450/A1.5x 2K/2Ca EDW 85.7kg TDC  -Heparin 3000 units IV TIW  -No ESA -Calcitriol 1.5 MCG PO TIW   Assessment/Plan: 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 ID following. Blood Cx 6/16 MSSA, negative Cx on 6/17, 6/19. TDC removed 6/18. Completed 72 hour line holiday. TTE  concerning for endocarditis. TEE cancelled d/t high risk factors. LS MRI with possible L5-S1 discitis. Temp cath placed 6/21, but poor function requiring exchange - see below. Will be getting IV Cefazolin 2g q HD x 6 weeks (until 11/04/22).  Dialysis Access: [facility] removed 6/18 - s/p line holiday -then temp line placed 6/21 with recurrent non-function issues despite exchange 6/26. S/p [facility] 7/1 with IR.  SBO - on CT Ab 6/19. Now resolved, surgery team has signed off. Hyperkalemia: Improved with HD. ESRD: Usual TTS schedule - then line issues preventing HD 6/27 -7/1. Finally able to be dialyzed 7/1 after new line was placed. He was hyperkalemia and in resp distress prior to HD, now improving. For HD again today for further fluid removal and to get back on usual schedule. Perm Access: S/p L AVF ligation in 2019. Last seen by Dr. Edilia Bo 01/2022 - limited mobility in R arm so planning for L arm graft but had not scheduled surgery yet.   Hypertension/volume: BP high, weights inaccurate - he is overloaded on exam, UF as tolerated. 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   Metabolic bone disease: CorrCa ok, Phos high. Previously on Renvela as binder - stopped due to SBO, changed to Velphoro - have ^d dose to 2/meals.  Nutrition - Renal diet, Alb very low - continue supps. Dispo - possible CIR admission.  Ozzie Hoyle, PA-C 09/23/2022, 10:39 AM  [facility]  Nephrology attending: I have personally seen and examined the patient bedside.  I agree with assessment and plan as outlined above.  Status post HD catheter placement by IR yesterday and received HD late last night.  Respiratory status is slightly better.  Plan for another HD today, UF as tolerated. Currently on antibiotics. Discussed with the patient, his sister and nursing staff.  Eddie North, [facility].

## 2022-09-23 NOTE — Plan of Care (Signed)
  Problem: Coping: Goal: Level of anxiety will decrease Outcome: Progressing   Problem: Pain Managment: Goal: General experience of comfort will improve Outcome: Progressing   

## 2022-09-23 NOTE — Progress Notes (Signed)
   09/22/22 2247  Vitals  Temp 98.3 F (36.8 C)  Temp Source Axillary  BP 136/67  MAP (mmHg) 84  BP Location Right Arm  BP Method Automatic  Patient Position (if appropriate) Lying  Pulse Rate Source Monitor  During Treatment Monitoring  Intra-Hemodialysis Comments Tx completed  Post Treatment  Dialyzer Clearance Lightly streaked  Duration of HD Treatment -hour(s) 3.75 hour(s)  Hemodialysis Intake (mL) 0 mL  Liters Processed 83.9  Fluid Removed (mL) 3000 mL  Tolerated HD Treatment Yes  Post-Hemodialysis Comments Pt Stable  Hemodialysis Catheter Left Subclavian Double lumen Permanent (Tunneled)  Placement Date/Time: 09/22/22 1526   Serial / Lot #: [id number]  Expiration Date: 05/22/27  Time Out: Correct site;Correct patient;Correct procedure  Maximum sterile barrier precautions: Hand hygiene;Cap;Mask;Sterile gown;Sterile gloves;Large sterile sh...  Site Condition No complications  Blue Lumen Status Heparin locked  Red Lumen Status Heparin locked  Catheter fill solution Heparin 1000 units/ml  Catheter fill volume (Arterial) 2 cc  Catheter fill volume (Venous) 1000  Dressing Type Transparent  Dressing Status Clean, Dry, Intact  Interventions Dressing reinforced  Drainage Description None  Dressing Change Due 09/28/22  Post treatment catheter status Capped and Clamped   Report Given to [employer] Nurse,

## 2022-09-23 NOTE — Progress Notes (Signed)
   09/23/22 2141  BiPAP/CPAP/SIPAP  BiPAP/CPAP/SIPAP V60  Reason BIPAP/CPAP not in use Non-compliant  BiPAP/CPAP /SiPAP Vitals  SpO2 100 %   Patient is wearing nasal cannula on 2L. He is satting 100% and states he only want to wear it if he has to. He does not want to wear tonight. He had dialysis today and is unlabored.

## 2022-09-23 NOTE — Progress Notes (Signed)
Speech Language Pathology Treatment: Cognitive-Linquistic  Patient Details Name: Ryan [name] MRN: [id number] DOB: 1960-06-22 Today's Date: 09/23/2022 Time: 4098-1191 SLP Time Calculation (min) (ACUTE ONLY): 19 min  Assessment / Plan / Recommendation Clinical Impression  F/u after initial cognitive assessment. Pt and his sister endorse improvements in cognition the last several days. Ryan [name] reports improved overall recall and concentration. He demonstrates awareness of errors in recall and improved ability to fill in the gaps. He acknowledges "feeling fuzzy" in the context of his illness.  Issues identified during cognitive assessment were related to executive functioning tasks.  Today, his declarative recall and slelctive attention are [facility]  We discussed the benefit of a comprehensive cognitive-linguistic assessment in AIR to ensure he is functioning at his highest level of independence upon D/C. Pt agrees with plan. Acute care SLP will sign off.   HPI HPI: Pt is a 62 y/o male admitted secondary to acute back pain and found to have L5-S1 discitis/osteomyelitis, SBO and MSSA bacteremia likely 2/2 line infection. Pt had HD cath replaced with rt femoral temp cath. 09/22/22 Hemodialysis Catheter Left Subclavian Double lumen Permanent (Tunneled). 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  PMH including but not limited to hypertension, hyperlipidemia, ESRD.      SLP Plan  All goals met      Recommendations for follow up therapy are one component of a multi-disciplinary discharge planning process, led by the attending physician.  Recommendations may be updated based on patient status, additional functional criteria and insurance authorization.    Recommendations   Comprehensive cognitive assessment next level of care                        Frontal lobe and executive function deficit   Cerebral infarction All goals met     Ryan [name] L. Ryan Frederic, MA CCC/SLP Clinical Specialist - Acute Care SLP Acute Rehabilitation Services Office number [phone]  Ryan [name] Ryan [name]  09/23/2022, 11:43 AM

## 2022-09-23 NOTE — Progress Notes (Signed)
Occupational Therapy Treatment Patient Details Name: Ryan [name] MRN: [id number] DOB: 06-29-1960 Today's Date: 09/23/2022   History of present illness Pt is a 62 y/o male admitted secondary to acute back pain and found to have L5-S1 discitis/osteomyelitis, SBO and MSSA bacteremia likely 2/2 line infection. Pt had HD cath replaced with rt femoral temp cath. 09/22/22 Hemodialysis Catheter Left Subclavian Double lumen Permanent (Tunneled). 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  PMH including but not limited to hypertension, hyperlipidemia, ESRD.   OT comments  This 62 yo male admitted with above making good progress with OT today in regards to bed mobility, sit<>stand, transfers and LBD. He will continue to benefit from acute OT with follow up from intensive inpatient follow up therapy, >3 hours/day to get back to his PLOF in the most efficient and timely manner.    Recommendations for follow up therapy are one component of a multi-disciplinary discharge planning process, led by the attending physician.  Recommendations may be updated based on patient status, additional functional criteria and insurance authorization.    Assistance Recommended at Discharge Frequent or constant Supervision/Assistance  Patient can return home with the following  Assistance with cooking/housework;Help with stairs or ramp for entrance;Assist for transportation;Direct supervision/assist for medications management;Direct supervision/assist for financial management;A little help with walking and/or transfers;A little help with bathing/dressing/bathroom   Equipment Recommendations  Tub/shower bench;BSC/3in1    Recommendations for Other Services Rehab consult    Precautions / Restrictions Precautions Precautions: Fall Restrictions Weight Bearing Restrictions: No       Mobility Bed Mobility Overal bed mobility: Needs Assistance Bed Mobility:  Rolling, Sidelying to Sit Rolling: Min guard Sidelying to sit: Min guard       General bed mobility comments: increased time and effort to come up to sit from side lying for trunk    Transfers Overall transfer level: Needs assistance Equipment used: Rolling walker (2 wheels) Transfers: Sit to/from Stand Sit to Stand: Min assist, From elevated surface     Step pivot transfers: Min assist     General transfer comment: No knees buckling today     Balance Overall balance assessment: Needs assistance Sitting-balance support: No upper extremity supported Sitting balance-Leahy Scale: Fair     Standing balance support: Bilateral upper extremity supported, Reliant on assistive device for balance Standing balance-Leahy Scale: Poor Standing balance comment: Required bilateral UE support from RW and min A addtional support from therapist                           ADL either performed or assessed with clinical judgement   ADL Overall ADL's : Needs assistance/impaired                       Lower Body Dressing Details (indicate cue type and reason): Pt able to donn socks while laying in bed with HOB up and crossing his legs in a "figure 4" pattern to get them on --it was  stretch for him but he was able to do it. Pt able to donn pj pants sitting in recliner with min-mod A sit<>stand   Toilet Transfer Details (indicate cue type and reason): Mod A sit<>stand with min A to take 3 steps to turn to recliner with RW.                Extremity/Trunk Assessment Upper Extremity Assessment Upper Extremity Assessment: Overall [facility]  for tasks assessed RUE Deficits / Details: Pt reports hx of R rotator cuff tear with subsequent weakness and decreased AROM/PROM in Right shoulder. Strength 3+ tp 4-/5 LUE Deficits / Details: Pt presents with general L UE MMT 3+ to 4-/5 and AROM shoulder flexion to approx. 105 degrees.            Vision Baseline Vision/History: 1 Wears  glasses Ability to See in Adequate Light: 1 Impaired Patient Visual Report: No change from baseline            Cognition Arousal/Alertness: Awake/alert Behavior During Therapy: WFL for tasks assessed/performed Overall Cognitive Status: Within Functional Limits for tasks assessed                                                     Pertinent Vitals/ Pain       Pain Assessment Pain Assessment: Faces Faces Pain Scale: Hurts a little bit Pain Location: Lower Back Pain Descriptors / Indicators: Sore Pain Intervention(s): Limited activity within patient's tolerance, Monitored during session, Repositioned         Frequency  Min 1X/week        Progress Toward Goals  OT Goals(current goals can now be found in the care plan section)  Progress towards OT goals: Progressing toward goals  Acute Rehab OT Goals Patient Stated Goal: to continue to feel good OT Goal Formulation: With patient Time For Goal Achievement: 09/25/22 Potential to Achieve Goals: Good  Plan Discharge plan remains appropriate       AM-PAC OT "6 Clicks" Daily Activity     Outcome Measure   Help from another person eating meals?: None Help from another person taking care of personal grooming?: A Little Help from another person toileting, which includes using toliet, bedpan, or urinal?: A Lot Help from another person bathing (including washing, rinsing, drying)?: A Little Help from another person to put on and taking off regular upper body clothing?: A Little Help from another person to put on and taking off regular lower body clothing?: A Little 6 Click Score: 18    End of Session Equipment Utilized During Treatment: Rolling walker (2 wheels);Gait belt  OT Visit Diagnosis: Unsteadiness on feet (R26.81);Muscle weakness (generalized) (M62.81);Pain Pain - Right/Left:  (lower back)   Activity Tolerance Patient tolerated treatment well   Patient Left in chair;with call bell/phone  within reach;with chair alarm set   Nurse Communication Mobility status        Time: 4782-9562 OT Time Calculation (min): 38 min  Charges: OT General Charges $OT Visit: 1 Visit OT Treatments $Self Care/Home Management : 38-52 mins  Lindon Romp OT Acute Rehabilitation Services Office [phone]   Evette Georges 09/23/2022, 10:16 AM

## 2022-09-24 DIAGNOSIS — E440 Moderate protein-calorie malnutrition: Secondary | ICD-10-CM | POA: Insufficient documentation

## 2022-09-24 DIAGNOSIS — M4647 Discitis, unspecified, lumbosacral region: Secondary | ICD-10-CM | POA: Diagnosis not present

## 2022-09-24 LAB — COMPREHENSIVE METABOLIC PANEL
ALT: 10 U/L (ref 0–44)
AST: 75 U/L — ABNORMAL HIGH (ref 15–41)
Albumin: 2.2 g/dL — ABNORMAL LOW (ref 3.5–5.0)
Alkaline Phosphatase: 105 U/L (ref 38–126)
Anion gap: 12 (ref 5–15)
BUN: 38 mg/dL — ABNORMAL HIGH (ref 8–23)
CO2: 26 mmol/L (ref 22–32)
Calcium: 8.3 mg/dL — ABNORMAL LOW (ref 8.9–10.3)
Chloride: 90 mmol/L — ABNORMAL LOW (ref 98–111)
Creatinine, Ser: 5.05 mg/dL — ABNORMAL HIGH (ref 0.61–1.24)
GFR, Estimated: 12 mL/min — ABNORMAL LOW (ref >60)
Glucose, Bld: 133 mg/dL — ABNORMAL HIGH (ref 70–99)
Potassium: 4.2 mmol/L (ref 3.5–5.1)
Sodium: 128 mmol/L — ABNORMAL LOW (ref 135–145)
Total Bilirubin: 0.6 mg/dL (ref 0.3–1.2)
Total Protein: 7.8 g/dL (ref 6.5–8.1)

## 2022-09-24 LAB — CBC
HCT: 24.8 % — ABNORMAL LOW (ref 39.0–52.0)
Hemoglobin: 8 g/dL — ABNORMAL LOW (ref 13.0–17.0)
MCH: 26.1 pg (ref 26.0–34.0)
MCHC: 32.3 g/dL (ref 30.0–36.0)
MCV: 80.8 fL (ref 80.0–100.0)
Platelets: 315 10*3/uL (ref 150–400)
RBC: 3.07 MIL/uL — ABNORMAL LOW (ref 4.22–5.81)
RDW: 15.9 % — ABNORMAL HIGH (ref 11.5–15.5)
WBC: 13.9 10*3/uL — ABNORMAL HIGH (ref 4.0–10.5)
nRBC: 0 % (ref 0.0–0.2)

## 2022-09-24 MED ORDER — MELATONIN 3 MG PO TABS
3.0000 mg | ORAL_TABLET | Freq: Every evening | ORAL | Status: DC | PRN
  Administered 2022-09-24: 3 mg via ORAL
  Filled 2022-09-24: qty 1

## 2022-09-24 NOTE — Plan of Care (Signed)

## 2022-09-24 NOTE — Progress Notes (Signed)
PROGRESS NOTE    Ryan [name]  RUE:[id number] DOB: 07/07/1960 DOA: 09/07/2022 PCP: Sharin Grave, MD    Brief Narrative:  62 year old gentleman with history of hypertension, hyperlipidemia, ESRD on hemodialysis who presented to the hospital with low back pain, ultimately found to have evidence of L5/S1 discitis and osteomyelitis as well as MSSA bacteremia.  ID was consulted.  He was also found to have endocarditis.  Clinically improving now and waiting to go to CIR.  On IV antibiotics with hemodialysis.  Patient also developed a small bowel obstruction treated conservatively while in the hospital.   Assessment & Plan:   L5/S1 discitis and osteomyelitis, MSSA bacteremia with mitral valve endocarditis, septic emboli to the lungs, septic emboli to the brain: Blood cultures positive for MSSA, repeat cultures negative.  Possibly secondary to line infection with septic emboli to the lung. MRI with L5/S1 early osteomyelitis.  Persistent leukocytosis. Catheter removed 6/18, HD catheter right femoral placed 6/21. Cefazolin until 8/13 as per ID recommendation. ID on board, on cefazolin IV with hemodialysis, plan for total 8 weeks of treatment.   TTE with mitral valve endocarditis, CT scan of the chest with septic emboli, new airspace opacity left upper lobe. CT surgery recommended conservative management. TEE not done given nonsurgical candidate and already on antibiotics and responding.  ESRD on hemodialysis: Nephrology following. Previous catheter removed.  Now has tunneled HD catheter placed on 7/1.  Receiving additional hemodialysis.  Small bowel obstruction: Relieved.  Now with normal bowel function.  Nutrition Status: Nutrition Problem: Moderate Malnutrition Etiology: acute illness (discitis/osteomyelitis, SBO) Signs/Symptoms: mild fat depletion, mild muscle depletion, moderate muscle depletion Interventions: MVI, Nepro shake, Liberalize Diet, Magic cup, Refer to RD note for  recommendations    Acute hypoxic respiratory failure with cavitary pneumonia: As above.  Improving.  On antibiotics.  Aggressive chest physiotherapy to continue.  Recommended CIR.   DVT prophylaxis: SCDs Start: 09/07/22 0513   Code Status: Full code Family Communication: None at the bedside Disposition Plan: Status is: Inpatient Remains inpatient appropriate because: Waiting for CIR     Consultants:  Infectious disease Nephrology  Procedures:  Multiple procedures  Antimicrobials:  Ancef with hemodialysis   Subjective: Patient seen in the morning rounds.  Up in the bedside chair.  On 2 L oxygen.  He tells me he is doing fairly well.  He thinks he can walk around.  Denies any chest pain or palpitations.  On 2 L oxygen.  Agreeable to rehab.  Objective: Vitals:   09/23/22 2342 09/24/22 0000 09/24/22 0404 09/24/22 0802  BP: 92/62  (!) 111/56 103/69  Pulse: 84  83 79  Resp: (!) 21 20 20 18   Temp: 97.6 F (36.4 C)  97.6 F (36.4 C) 98.1 F (36.7 C)  TempSrc: Oral  Oral Oral  SpO2: 96%  100% 100%  Weight:   79.6 kg   Height:        Intake/Output Summary (Last 24 hours) at 09/24/2022 1214 Last data filed at 09/23/2022 2000 Gross per 24 hour  Intake 240 ml  Output 3000 ml  Net -2760 ml   Filed Weights   09/23/22 1443 09/23/22 1816 09/24/22 0404  Weight: 77.8 kg 74.8 kg 79.6 kg    Examination:  General exam: Appears calm and comfortable  On 2 L oxygen.  Sitting at the bedside chair.  Alert awake and communicating.  Pleasant to conversation. Respiratory system: Clear to auscultation. Respiratory effort normal.  No added sounds. SpO2: 100 % O2 Flow Rate (L/min):  4 L/min FiO2 (%): 40 %  Cardiovascular system: S1 & S2 heard, RRR. No pedal edema. Permacath left chest wall. Gastrointestinal system: Abdomen is nondistended, soft and nontender. No organomegaly or masses felt. Normal bowel sounds heard. Central nervous system: Alert and oriented. No focal neurological  deficits.    Data Reviewed: I have personally reviewed following labs and imaging studies  CBC: Recent Labs  Lab 09/20/22 0250 09/21/22 0513 09/22/22 0416 09/23/22 0105 09/24/22 0022  WBC 22.5* 24.6* 25.1* 17.5* 13.9*  HGB 7.8* 8.2* 7.7* 7.6* 8.0*  HCT 23.2* 23.6* 23.5* 22.6* 24.8*  MCV 82.3 81.7 80.5 79.0* 80.8  PLT 292 313 312 285 315   Basic Metabolic Panel: Recent Labs  Lab 09/18/22 0234 09/19/22 0424 09/21/22 0513 09/21/22 1037 09/22/22 0416 09/23/22 0105 09/24/22 0022  NA 128*   < > 127* 126* 124* 130* 128*  K 4.7   < > 5.3* 5.4* 6.0* 4.3 4.2  CL 91*   < > 89* 86* 87* 92* 90*  CO2 19*   < > 22 20* 20* 20* 26  GLUCOSE 111*   < > 104* 134* 129* 111* 133*  BUN 83*   < > 104* 109* 119* 55* 38*  CREATININE 11.23*   < > 13.26* 13.01* 14.43* 7.70* 5.05*  CALCIUM 8.0*   < > 7.9* 8.0* 7.7* 7.7* 8.3*  PHOS 8.8*  --   --  9.2*  --  7.5*  --    < > = values in this interval not displayed.   GFR: Estimated Creatinine Clearance: 15.4 mL/min (A) (by C-G formula based on SCr of 5.05 mg/dL (H)). Liver Function Tests: Recent Labs  Lab 09/19/22 0424 09/20/22 0250 09/21/22 0513 09/21/22 1037 09/22/22 0416 09/23/22 0105 09/24/22 0022  AST 41 52* 61*  --  59*  --  75*  ALT 6 6 6   --  7  --  10  ALKPHOS 104 93 97  --  118  --  105  BILITOT 0.5 0.6 0.6  --  0.8  --  0.6  PROT 7.0 6.7 7.1  --  7.6  --  7.8  ALBUMIN 1.6* 1.5* 1.7* 1.7* 1.8* 1.7* 2.2*   No results for input(s): "LIPASE", "AMYLASE" in the last 168 hours. No results for input(s): "AMMONIA" in the last 168 hours. Coagulation Profile: No results for input(s): "INR", "PROTIME" in the last 168 hours. Cardiac Enzymes: No results for input(s): "CKTOTAL", "CKMB", "CKMBINDEX", "TROPONINI" in the last 168 hours. BNP (last 3 results) No results for input(s): "PROBNP" in the last 8760 hours. HbA1C: No results for input(s): "HGBA1C" in the last 72 hours. CBG: No results for input(s): "GLUCAP" in the last 168  hours. Lipid Profile: No results for input(s): "CHOL", "HDL", "LDLCALC", "TRIG", "CHOLHDL", "LDLDIRECT" in the last 72 hours. Thyroid Function Tests: No results for input(s): "TSH", "T4TOTAL", "FREET4", "T3FREE", "THYROIDAB" in the last 72 hours. Anemia Panel: No results for input(s): "VITAMINB12", "FOLATE", "FERRITIN", "TIBC", "IRON", "RETICCTPCT" in the last 72 hours. Sepsis Labs: No results for input(s): "PROCALCITON", "LATICACIDVEN" in the last 168 hours.  No results found for this or any previous visit (from the past 240 hour(s)).       Radiology Studies: IR Fluoro Guide CV Line Left  Result Date: 09/22/2022 CLINICAL DATA:  Renal insufficiency, needs durable venous access for hemodialysis EXAM: TUNNELED HEMODIALYSIS CATHETER PLACEMENT WITH ULTRASOUND AND FLUOROSCOPIC GUIDANCE TECHNIQUE: The procedure, risks, benefits, and alternatives were explained to the patient. Questions regarding the procedure were encouraged and  answered. The patient understands and consents to the procedure. As antibiotic prophylaxis, cefazolin 2 g was ordered pre-procedure and administered intravenously within one hour of incision.Patency of the left IJ vein was confirmed with ultrasound with image documentation. An appropriate skin site was determined. Region was prepped using maximum barrier technique including cap and mask, sterile gown, sterile gloves, large sterile sheet, and Chlorhexidine as cutaneous antisepsis. The region was infiltrated locally with 1% lidocaine. Intravenous Fentanyl and Versed 1.5mg  were administered as conscious sedation during continuous monitoring of the patient's level of consciousness and physiological / cardiorespiratory status by the radiology RN, with a total moderate sedation time of 20 minutes. Under real-time ultrasound guidance, the left IJ vein was accessed with a 21 gauge micropuncture needle; the needle tip within the vein was confirmed with ultrasound image  documentation. Needle exchanged over the 018 guidewire for transitional dilator. Limited venogram performed demonstrating central venous patency. Benson wire was advanced into the IVC. Over this, an MPA catheter was advanced. A Palindrome 23 hemodialysis catheter was tunneled from the left anterior chest wall approach to the left IJ dermatotomy site. The MPA catheter was exchanged over an Amplatz wire for serial vascular dilators which allow placement of a peel-away sheath, through which the catheter was advanced under intermittent fluoroscopy, positioned with its tips in the proximal and midright atrium. Spot chest radiograph confirms good catheter position. No pneumothorax. Catheter was flushed and primed per protocol. Catheter secured externally with O Prolene sutures. The left IJ dermatotomy site was closed with Dermabond. The temporary right femoral hemodialysis catheter was removed and hemostasis achieved at the site with manual compression. The patient tolerated the procedure well. COMPLICATIONS: COMPLICATIONS None immediate FLUOROSCOPY: Radiation Exposure Index (as provided by the fluoroscopic device): 77 mGy air Kerma COMPARISON:  None Available. IMPRESSION: 1. Technically successful placement of tunneled left IJ hemodialysis catheter with ultrasound and fluoroscopic guidance. Ready for routine use. ACCESS: Remains approachable for percutaneous intervention as needed. Electronically Signed   By: Corlis Leak M.D.   On: 09/22/2022 16:15   IR US Guide Vasc Access Left  Result Date: 09/22/2022 CLINICAL DATA:  Renal insufficiency, needs durable venous access for hemodialysis EXAM: TUNNELED HEMODIALYSIS CATHETER PLACEMENT WITH ULTRASOUND AND FLUOROSCOPIC GUIDANCE TECHNIQUE: The procedure, risks, benefits, and alternatives were explained to the patient. Questions regarding the procedure were encouraged and answered. The patient understands and consents to the procedure. As antibiotic prophylaxis, cefazolin 2 g  was ordered pre-procedure and administered intravenously within one hour of incision.Patency of the left IJ vein was confirmed with ultrasound with image documentation. An appropriate skin site was determined. Region was prepped using maximum barrier technique including cap and mask, sterile gown, sterile gloves, large sterile sheet, and Chlorhexidine as cutaneous antisepsis. The region was infiltrated locally with 1% lidocaine. Intravenous Fentanyl and Versed 1.5mg  were administered as conscious sedation during continuous monitoring of the patient's level of consciousness and physiological / cardiorespiratory status by the radiology RN, with a total moderate sedation time of 20 minutes. Under real-time ultrasound guidance, the left IJ vein was accessed with a 21 gauge micropuncture needle; the needle tip within the vein was confirmed with ultrasound image documentation. Needle exchanged over the 018 guidewire for transitional dilator. Limited venogram performed demonstrating central venous patency. Benson wire was advanced into the IVC. Over this, an MPA catheter was advanced. A Palindrome 23 hemodialysis catheter was tunneled from the left anterior chest wall approach to the left IJ dermatotomy site. The MPA catheter was exchanged over  an Amplatz wire for serial vascular dilators which allow placement of a peel-away sheath, through which the catheter was advanced under intermittent fluoroscopy, positioned with its tips in the proximal and midright atrium. Spot chest radiograph confirms good catheter position. No pneumothorax. Catheter was flushed and primed per protocol. Catheter secured externally with O Prolene sutures. The left IJ dermatotomy site was closed with Dermabond. The temporary right femoral hemodialysis catheter was removed and hemostasis achieved at the site with manual compression. The patient tolerated the procedure well. COMPLICATIONS: COMPLICATIONS None immediate FLUOROSCOPY: Radiation  Exposure Index (as provided by the fluoroscopic device): 77 mGy air Kerma COMPARISON:  None Available. IMPRESSION: 1. Technically successful placement of tunneled left IJ hemodialysis catheter with ultrasound and fluoroscopic guidance. Ready for routine use. ACCESS: Remains approachable for percutaneous intervention as needed. Electronically Signed   By: Corlis Leak M.D.   On: 09/22/2022 16:15        Scheduled Meds:  atenolol  12.5 mg Oral Daily   calcitRIOL  0.5 mcg Oral Q T,Th,Sat-1800   Chlorhexidine Gluconate Cloth  6 each Topical Q0600   cinacalcet  30 mg Oral Q supper   [START ON 09/25/2022] darbepoetin (ARANESP) injection - DIALYSIS  100 mcg Subcutaneous Q Thu-1800   feeding supplement (NEPRO CARB STEADY)  237 mL Oral BID BM   multivitamin  1 tablet Oral QHS   pantoprazole  40 mg Oral Daily   sodium chloride flush  3 mL Intravenous Q12H   sucroferric oxyhydroxide  1,000 mg Oral TID WC   Continuous Infusions:   ceFAZolin (ANCEF) IV 2 g (09/20/22 1902)   methocarbamol (ROBAXIN) IV 110 mL/hr at 09/20/22 1600     LOS: 17 days    Time spent: 35 minutes    Dorcas Carrow, MD [facility]

## 2022-09-24 NOTE — Progress Notes (Signed)
Mobility Specialist Progress Note:    09/24/22 1044  Mobility  Activity Ambulated with assistance in hallway  Level of Assistance Minimal assist, patient does 75% or more  Assistive Device Front wheel walker  Distance Ambulated (ft) 106 ft  Activity Response Tolerated well  Mobility Referral Yes  $Mobility charge 1 Mobility  Mobility Specialist Start Time (ACUTE ONLY) 0950  Mobility Specialist Stop Time (ACUTE ONLY) 1016  Mobility Specialist Time Calculation (min) (ACUTE ONLY) 26 min   Pt received in chair, agreeable to ambulate. Pt required MinA for STS and during ambulating d/t his swaying gate and slight bilat knee buckling. C/o strong back pain throughout session. Assisted back to chair w/ call bell and personal belongings at hand. All needs met.   Thompson Grayer Mobility Specialist  Please contact vis Secure Chat or  Rehab Office [phone]

## 2022-09-24 NOTE — Progress Notes (Addendum)
Inpatient Rehab Admissions Coordinator:   Pt appears clinically improved and approaching readiness for transition to CIR.  Will start insurance auth today.  Called daughter, Karel Jarvis, to update.   Estill Dooms, PT, DPT Admissions Coordinator [phone] 09/24/22  8:21 AM

## 2022-09-24 NOTE — Progress Notes (Signed)
   09/24/22 2129  BiPAP/CPAP/SIPAP  BiPAP/CPAP/SIPAP Pt Type Adult  BiPAP/CPAP/SIPAP V60  Reason BIPAP/CPAP not in use Non-compliant

## 2022-09-24 NOTE — Progress Notes (Signed)
[facility] Progress Note   Subjective:  Seen in room - doing ok today. Some DOE - using O2. No new pains. Possibly transferring to CIR soon?  Objective Vitals:   09/23/22 2342 09/24/22 0000 09/24/22 0404 09/24/22 0802  BP: 92/62  (!) 111/56 103/69  Pulse: 84  83 79  Resp: (!) 21 20 20 18   Temp: 97.6 F (36.4 C)  97.6 F (36.4 C) 98.1 F (36.7 C)  TempSrc: Oral  Oral Oral  SpO2: 96%  100% 100%  Weight:   79.6 kg   Height:       Physical Exam General: Well appearing man, NAD. Diller O2 in place. Heart: RRR; no murmur Lungs: Bibasilar rales Abdomen: soft Extremities: no LE edema Dialysis Access:  none  Additional Objective Labs: Basic Metabolic Panel: Recent Labs  Lab 09/18/22 0234 09/19/22 0424 09/21/22 1037 09/22/22 0416 09/23/22 0105 09/24/22 0022  NA 128*   < > 126* 124* 130* 128*  K 4.7   < > 5.4* 6.0* 4.3 4.2  CL 91*   < > 86* 87* 92* 90*  CO2 19*   < > 20* 20* 20* 26  GLUCOSE 111*   < > 134* 129* 111* 133*  BUN 83*   < > 109* 119* 55* 38*  CREATININE 11.23*   < > 13.01* 14.43* 7.70* 5.05*  CALCIUM 8.0*   < > 8.0* 7.7* 7.7* 8.3*  PHOS 8.8*  --  9.2*  --  7.5*  --    < > = values in this interval not displayed.   Liver Function Tests: Recent Labs  Lab 09/21/22 0513 09/21/22 1037 09/22/22 0416 09/23/22 0105 09/24/22 0022  AST 61*  --  59*  --  75*  ALT 6  --  7  --  10  ALKPHOS 97  --  118  --  105  BILITOT 0.6  --  0.8  --  0.6  PROT 7.1  --  7.6  --  7.8  ALBUMIN 1.7*   < > 1.8* 1.7* 2.2*   < > = values in this interval not displayed.   CBC: Recent Labs  Lab 09/20/22 0250 09/21/22 0513 09/22/22 0416 09/23/22 0105 09/24/22 0022  WBC 22.5* 24.6* 25.1* 17.5* 13.9*  HGB 7.8* 8.2* 7.7* 7.6* 8.0*  HCT 23.2* 23.6* 23.5* 22.6* 24.8*  MCV 82.3 81.7 80.5 79.0* 80.8  PLT 292 313 312 285 315   Medications:   ceFAZolin (ANCEF) IV 2 g (09/20/22 1902)   methocarbamol (ROBAXIN) IV 110 mL/hr at 09/20/22 1600    atenolol  12.5 mg Oral  Daily   calcitRIOL  0.5 mcg Oral Q T,Th,Sat-1800   Chlorhexidine Gluconate Cloth  6 each Topical Q0600   cinacalcet  30 mg Oral Q supper   [START ON 09/25/2022] darbepoetin (ARANESP) injection - DIALYSIS  100 mcg Subcutaneous Q Thu-1800   feeding supplement (NEPRO CARB STEADY)  237 mL Oral BID BM   multivitamin  1 tablet Oral QHS   pantoprazole  40 mg Oral Daily   sodium chloride flush  3 mL Intravenous Q12H   sucroferric oxyhydroxide  1,000 mg Oral TID WC    Dialysis Orders: [facility]  3:45 450/A1.5x 2K/2Ca EDW 85.7kg TDC  -Heparin 3000 units IV TIW  -No ESA -Calcitriol 1.5 MCG PO TIW   Assessment/Plan: 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 ID following. Blood Cx 6/16 MSSA, negative Cx on 6/17, 6/19. TDC removed 6/18. Completed 72 hour line holiday. TTE  concerning for endocarditis. TEE cancelled d/t high risk factors. LS MRI with possible L5-S1 discitis. Temp cath placed 6/21, but poor function requiring exchange - see below. Will be getting IV Cefazolin 2g q HD x 6 weeks (until 11/04/22).  Dialysis Access: [facility] removed 6/18 - s/p line holiday, then temp line placed 6/21 with recurrent non-function issues despite exchange 6/26. S/p [facility] 7/1 with IR.  SBO - on CT Ab 6/19. Now resolved, surgery team has signed off. Hyperkalemia: Improved with HD. ESRD: Usual TTS schedule - then line issues preventing HD 6/27 -7/1. Now resolved and HD 7/1, 7/2 to correct. Next HD tomorrow (7/4). Perm Access: S/p L AVF ligation in 2019. Last seen by Dr. Edilia Bo 01/2022 - limited mobility in R arm so planning for L arm graft but had not scheduled surgery yet.   Hypertension/volume: BP better, still with volume on exam, UF as tolerated. Will need lower EDW. Anemia of ESRD: Hgb 8 - continue Aranesp q Thursday, have increased next dose. Metabolic bone disease: CorrCa ok, Phos high. Previously on Renvela as binder - stopped due to SBO, changed to Velphoro - have ^d dose to  2/meals.  Nutrition - Renal diet, Alb low - continue supps. Dispo - possible CIR admission.    Ozzie Hoyle, PA-C 09/24/2022, 11:53 AM  [employer]

## 2022-09-25 ENCOUNTER — Inpatient Hospital Stay (HOSPITAL_COMMUNITY)
Admission: RE | Admit: 2022-09-25 | Discharge: 2022-10-09 | DRG: 945 | Disposition: A | Discharge status: Other Acute Inpatient Hospital | Payer: No Typology Code available for payment source | Source: Intra-hospital | Attending: Physical Medicine and Rehabilitation | Admitting: Physical Medicine and Rehabilitation

## 2022-09-25 ENCOUNTER — Other Ambulatory Visit: Payer: Self-pay

## 2022-09-25 ENCOUNTER — Encounter (HOSPITAL_COMMUNITY): Payer: Self-pay

## 2022-09-25 DIAGNOSIS — R000 Tachycardia, unspecified: Secondary | ICD-10-CM | POA: Diagnosis not present

## 2022-09-25 DIAGNOSIS — M4640 Discitis, unspecified, site unspecified: Secondary | ICD-10-CM | POA: Diagnosis not present

## 2022-09-25 DIAGNOSIS — E875 Hyperkalemia: Secondary | ICD-10-CM | POA: Diagnosis not present

## 2022-09-25 DIAGNOSIS — T80211D Bloodstream infection due to central venous catheter, subsequent encounter: Principal | ICD-10-CM

## 2022-09-25 DIAGNOSIS — I959 Hypotension, unspecified: Secondary | ICD-10-CM | POA: Diagnosis not present

## 2022-09-25 DIAGNOSIS — M4626 Osteomyelitis of vertebra, lumbar region: Secondary | ICD-10-CM | POA: Diagnosis not present

## 2022-09-25 DIAGNOSIS — D638 Anemia in other chronic diseases classified elsewhere: Secondary | ICD-10-CM | POA: Diagnosis not present

## 2022-09-25 DIAGNOSIS — F419 Anxiety disorder, unspecified: Secondary | ICD-10-CM | POA: Diagnosis present

## 2022-09-25 DIAGNOSIS — N250 Renal osteodystrophy: Secondary | ICD-10-CM | POA: Diagnosis not present

## 2022-09-25 DIAGNOSIS — Z5986 Financial insecurity: Secondary | ICD-10-CM

## 2022-09-25 DIAGNOSIS — I330 Acute and subacute infective endocarditis: Secondary | ICD-10-CM | POA: Diagnosis present

## 2022-09-25 DIAGNOSIS — R34 Anuria and oliguria: Secondary | ICD-10-CM | POA: Diagnosis present

## 2022-09-25 DIAGNOSIS — I517 Cardiomegaly: Secondary | ICD-10-CM | POA: Diagnosis not present

## 2022-09-25 DIAGNOSIS — J9690 Respiratory failure, unspecified, unspecified whether with hypoxia or hypercapnia: Secondary | ICD-10-CM | POA: Diagnosis not present

## 2022-09-25 DIAGNOSIS — R12 Heartburn: Secondary | ICD-10-CM | POA: Diagnosis not present

## 2022-09-25 DIAGNOSIS — M869 Osteomyelitis, unspecified: Secondary | ICD-10-CM | POA: Diagnosis not present

## 2022-09-25 DIAGNOSIS — R5381 Other malaise: Secondary | ICD-10-CM | POA: Diagnosis present

## 2022-09-25 DIAGNOSIS — I440 Atrioventricular block, first degree: Secondary | ICD-10-CM | POA: Diagnosis not present

## 2022-09-25 DIAGNOSIS — R7881 Bacteremia: Secondary | ICD-10-CM | POA: Diagnosis not present

## 2022-09-25 DIAGNOSIS — R252 Cramp and spasm: Secondary | ICD-10-CM | POA: Diagnosis present

## 2022-09-25 DIAGNOSIS — J690 Pneumonitis due to inhalation of food and vomit: Secondary | ICD-10-CM | POA: Diagnosis not present

## 2022-09-25 DIAGNOSIS — K5901 Slow transit constipation: Secondary | ICD-10-CM | POA: Diagnosis not present

## 2022-09-25 DIAGNOSIS — Y830 Surgical operation with transplant of whole organ as the cause of abnormal reaction of the patient, or of later complication, without mention of misadventure at the time of the procedure: Secondary | ICD-10-CM | POA: Diagnosis present

## 2022-09-25 DIAGNOSIS — E854 Organ-limited amyloidosis: Secondary | ICD-10-CM | POA: Diagnosis not present

## 2022-09-25 DIAGNOSIS — R195 Other fecal abnormalities: Secondary | ICD-10-CM | POA: Diagnosis not present

## 2022-09-25 DIAGNOSIS — T8612 Kidney transplant failure: Secondary | ICD-10-CM | POA: Diagnosis not present

## 2022-09-25 DIAGNOSIS — J9601 Acute respiratory failure with hypoxia: Secondary | ICD-10-CM | POA: Diagnosis not present

## 2022-09-25 DIAGNOSIS — Z1152 Encounter for screening for COVID-19: Secondary | ICD-10-CM | POA: Diagnosis not present

## 2022-09-25 DIAGNOSIS — D72829 Elevated white blood cell count, unspecified: Secondary | ICD-10-CM | POA: Diagnosis not present

## 2022-09-25 DIAGNOSIS — E785 Hyperlipidemia, unspecified: Secondary | ICD-10-CM | POA: Diagnosis not present

## 2022-09-25 DIAGNOSIS — M4647 Discitis, unspecified, lumbosacral region: Secondary | ICD-10-CM | POA: Diagnosis not present

## 2022-09-25 DIAGNOSIS — D649 Anemia, unspecified: Secondary | ICD-10-CM | POA: Diagnosis not present

## 2022-09-25 DIAGNOSIS — M7989 Other specified soft tissue disorders: Secondary | ICD-10-CM | POA: Diagnosis present

## 2022-09-25 DIAGNOSIS — I76 Septic arterial embolism: Secondary | ICD-10-CM | POA: Diagnosis not present

## 2022-09-25 DIAGNOSIS — I4891 Unspecified atrial fibrillation: Secondary | ICD-10-CM | POA: Diagnosis not present

## 2022-09-25 DIAGNOSIS — J811 Chronic pulmonary edema: Secondary | ICD-10-CM | POA: Diagnosis not present

## 2022-09-25 DIAGNOSIS — R1319 Other dysphagia: Secondary | ICD-10-CM | POA: Diagnosis present

## 2022-09-25 DIAGNOSIS — Z91041 Radiographic dye allergy status: Secondary | ICD-10-CM

## 2022-09-25 DIAGNOSIS — I339 Acute and subacute endocarditis, unspecified: Secondary | ICD-10-CM | POA: Diagnosis not present

## 2022-09-25 DIAGNOSIS — I951 Orthostatic hypotension: Secondary | ICD-10-CM | POA: Diagnosis present

## 2022-09-25 DIAGNOSIS — R059 Cough, unspecified: Secondary | ICD-10-CM | POA: Diagnosis present

## 2022-09-25 DIAGNOSIS — R791 Abnormal coagulation profile: Secondary | ICD-10-CM | POA: Diagnosis not present

## 2022-09-25 DIAGNOSIS — I5031 Acute diastolic (congestive) heart failure: Secondary | ICD-10-CM | POA: Diagnosis not present

## 2022-09-25 DIAGNOSIS — D62 Acute posthemorrhagic anemia: Secondary | ICD-10-CM | POA: Diagnosis present

## 2022-09-25 DIAGNOSIS — Z79899 Other long term (current) drug therapy: Secondary | ICD-10-CM

## 2022-09-25 DIAGNOSIS — I120 Hypertensive chronic kidney disease with stage 5 chronic kidney disease or end stage renal disease: Secondary | ICD-10-CM | POA: Diagnosis not present

## 2022-09-25 DIAGNOSIS — Z881 Allergy status to other antibiotic agents status: Secondary | ICD-10-CM

## 2022-09-25 DIAGNOSIS — D631 Anemia in chronic kidney disease: Secondary | ICD-10-CM | POA: Diagnosis present

## 2022-09-25 DIAGNOSIS — I43 Cardiomyopathy in diseases classified elsewhere: Secondary | ICD-10-CM | POA: Diagnosis not present

## 2022-09-25 DIAGNOSIS — E43 Unspecified severe protein-calorie malnutrition: Secondary | ICD-10-CM | POA: Diagnosis not present

## 2022-09-25 DIAGNOSIS — M4627 Osteomyelitis of vertebra, lumbosacral region: Secondary | ICD-10-CM | POA: Diagnosis not present

## 2022-09-25 DIAGNOSIS — I132 Hypertensive heart and chronic kidney disease with heart failure and with stage 5 chronic kidney disease, or end stage renal disease: Secondary | ICD-10-CM | POA: Diagnosis not present

## 2022-09-25 DIAGNOSIS — Z820 Family history of epilepsy and other diseases of the nervous system: Secondary | ICD-10-CM

## 2022-09-25 DIAGNOSIS — R197 Diarrhea, unspecified: Secondary | ICD-10-CM | POA: Diagnosis not present

## 2022-09-25 DIAGNOSIS — B9561 Methicillin susceptible Staphylococcus aureus infection as the cause of diseases classified elsewhere: Secondary | ICD-10-CM | POA: Diagnosis present

## 2022-09-25 DIAGNOSIS — Z6825 Body mass index (BMI) 25.0-25.9, adult: Secondary | ICD-10-CM

## 2022-09-25 DIAGNOSIS — Z992 Dependence on renal dialysis: Secondary | ICD-10-CM

## 2022-09-25 DIAGNOSIS — N186 End stage renal disease: Secondary | ICD-10-CM | POA: Diagnosis present

## 2022-09-25 DIAGNOSIS — J9811 Atelectasis: Secondary | ICD-10-CM | POA: Diagnosis not present

## 2022-09-25 DIAGNOSIS — R0989 Other specified symptoms and signs involving the circulatory and respiratory systems: Secondary | ICD-10-CM | POA: Diagnosis not present

## 2022-09-25 DIAGNOSIS — G479 Sleep disorder, unspecified: Secondary | ICD-10-CM | POA: Diagnosis present

## 2022-09-25 DIAGNOSIS — Z823 Family history of stroke: Secondary | ICD-10-CM

## 2022-09-25 DIAGNOSIS — E8770 Fluid overload, unspecified: Secondary | ICD-10-CM | POA: Diagnosis not present

## 2022-09-25 DIAGNOSIS — E871 Hypo-osmolality and hyponatremia: Secondary | ICD-10-CM | POA: Diagnosis present

## 2022-09-25 DIAGNOSIS — Z8249 Family history of ischemic heart disease and other diseases of the circulatory system: Secondary | ICD-10-CM

## 2022-09-25 DIAGNOSIS — Z8051 Family history of malignant neoplasm of kidney: Secondary | ICD-10-CM

## 2022-09-25 DIAGNOSIS — F5104 Psychophysiologic insomnia: Secondary | ICD-10-CM | POA: Diagnosis present

## 2022-09-25 DIAGNOSIS — I69328 Other speech and language deficits following cerebral infarction: Secondary | ICD-10-CM

## 2022-09-25 DIAGNOSIS — J188 Other pneumonia, unspecified organism: Secondary | ICD-10-CM | POA: Diagnosis not present

## 2022-09-25 DIAGNOSIS — Y838 Other surgical procedures as the cause of abnormal reaction of the patient, or of later complication, without mention of misadventure at the time of the procedure: Secondary | ICD-10-CM | POA: Diagnosis present

## 2022-09-25 DIAGNOSIS — R918 Other nonspecific abnormal finding of lung field: Secondary | ICD-10-CM | POA: Diagnosis not present

## 2022-09-25 DIAGNOSIS — I77819 Aortic ectasia, unspecified site: Secondary | ICD-10-CM | POA: Diagnosis not present

## 2022-09-25 DIAGNOSIS — I39 Endocarditis and heart valve disorders in diseases classified elsewhere: Secondary | ICD-10-CM | POA: Diagnosis not present

## 2022-09-25 DIAGNOSIS — J189 Pneumonia, unspecified organism: Secondary | ICD-10-CM | POA: Diagnosis not present

## 2022-09-25 DIAGNOSIS — I69391 Dysphagia following cerebral infarction: Secondary | ICD-10-CM | POA: Diagnosis not present

## 2022-09-25 DIAGNOSIS — K5900 Constipation, unspecified: Secondary | ICD-10-CM | POA: Diagnosis not present

## 2022-09-25 DIAGNOSIS — J90 Pleural effusion, not elsewhere classified: Secondary | ICD-10-CM | POA: Diagnosis not present

## 2022-09-25 DIAGNOSIS — I10 Essential (primary) hypertension: Secondary | ICD-10-CM | POA: Diagnosis not present

## 2022-09-25 DIAGNOSIS — I4719 Other supraventricular tachycardia: Secondary | ICD-10-CM | POA: Diagnosis not present

## 2022-09-25 DIAGNOSIS — I2690 Septic pulmonary embolism without acute cor pulmonale: Secondary | ICD-10-CM | POA: Diagnosis not present

## 2022-09-25 DIAGNOSIS — E440 Moderate protein-calorie malnutrition: Secondary | ICD-10-CM | POA: Diagnosis not present

## 2022-09-25 DIAGNOSIS — M4649 Discitis, unspecified, multiple sites in spine: Secondary | ICD-10-CM | POA: Diagnosis present

## 2022-09-25 MED ORDER — SENNA 8.6 MG PO TABS
1.0000 | ORAL_TABLET | Freq: Every day | ORAL | Status: DC | PRN
  Administered 2022-09-27: 8.6 mg via ORAL
  Filled 2022-09-25: qty 1

## 2022-09-25 MED ORDER — METHOCARBAMOL 500 MG PO TABS
250.0000 mg | ORAL_TABLET | Freq: Two times a day (BID) | ORAL | Status: DC
  Administered 2022-09-25 – 2022-10-09 (×22): 250 mg via ORAL
  Filled 2022-09-25 (×20): qty 1
  Filled 2022-09-25: qty 0.5
  Filled 2022-09-25 (×3): qty 1

## 2022-09-25 MED ORDER — NEPRO/CARBSTEADY PO LIQD
237.0000 mL | Freq: Two times a day (BID) | ORAL | Status: DC
  Administered 2022-09-26 – 2022-10-03 (×11): 237 mL via ORAL

## 2022-09-25 MED ORDER — CHLORPROMAZINE HCL 25 MG PO TABS: 25.0000 mg | ORAL_TABLET | Freq: Three times a day (TID) | ORAL | Status: DC | PRN

## 2022-09-25 MED ORDER — RENA-VITE PO TABS
1.0000 | ORAL_TABLET | Freq: Every day | ORAL | Status: DC
  Administered 2022-09-25 – 2022-10-08 (×14): 1 via ORAL
  Filled 2022-09-25 (×14): qty 1

## 2022-09-25 MED ORDER — CHLORHEXIDINE GLUCONATE CLOTH 2 % EX PADS
6.0000 | MEDICATED_PAD | Freq: Every day | CUTANEOUS | Status: DC
  Administered 2022-09-25 – 2022-09-26 (×2): 6 via TOPICAL

## 2022-09-25 MED ORDER — OXYCODONE HCL 5 MG PO TABS
5.0000 mg | ORAL_TABLET | ORAL | Status: DC | PRN
  Administered 2022-09-25 – 2022-09-26 (×2): 10 mg via ORAL
  Filled 2022-09-25 (×2): qty 2

## 2022-09-25 MED ORDER — CAMPHOR-MENTHOL 0.5-0.5 % EX LOTN: TOPICAL_LOTION | CUTANEOUS | Status: DC | PRN

## 2022-09-25 MED ORDER — CINACALCET HCL 30 MG PO TABS
30.0000 mg | ORAL_TABLET | Freq: Every day | ORAL | Status: DC
  Administered 2022-09-25 – 2022-10-08 (×13): 30 mg via ORAL
  Filled 2022-09-25 (×15): qty 1

## 2022-09-25 MED ORDER — SIMETHICONE 80 MG PO CHEW: 80.0000 mg | CHEWABLE_TABLET | Freq: Four times a day (QID) | ORAL | Status: DC | PRN

## 2022-09-25 MED ORDER — BISACODYL 10 MG RE SUPP: 10.0000 mg | Freq: Every day | RECTAL | Status: DC | PRN

## 2022-09-25 MED ORDER — TRAZODONE HCL 50 MG PO TABS
25.0000 mg | ORAL_TABLET | Freq: Every day | ORAL | Status: DC
  Administered 2022-09-25: 25 mg via ORAL
  Filled 2022-09-25: qty 1

## 2022-09-25 MED ORDER — PANTOPRAZOLE SODIUM 40 MG PO TBEC
40.0000 mg | DELAYED_RELEASE_TABLET | Freq: Every day | ORAL | Status: DC
  Administered 2022-09-26 – 2022-10-08 (×13): 40 mg via ORAL
  Filled 2022-09-25 (×13): qty 1

## 2022-09-25 MED ORDER — SUCROFERRIC OXYHYDROXIDE 500 MG PO CHEW
1000.0000 mg | CHEWABLE_TABLET | Freq: Three times a day (TID) | ORAL | Status: DC
  Administered 2022-09-25 – 2022-10-09 (×36): 1000 mg via ORAL
  Filled 2022-09-25 (×39): qty 2

## 2022-09-25 MED ORDER — PHENOL 1.4 % MT LIQD
1.0000 | OROMUCOSAL | Status: DC | PRN
  Administered 2022-10-05: 1 via OROMUCOSAL

## 2022-09-25 MED ORDER — MELATONIN 3 MG PO TABS
3.0000 mg | ORAL_TABLET | Freq: Every evening | ORAL | Status: DC | PRN
  Administered 2022-10-08 – 2022-10-09 (×2): 3 mg via ORAL
  Filled 2022-09-25 (×2): qty 1

## 2022-09-25 MED ORDER — SUCROFERRIC OXYHYDROXIDE 500 MG PO CHEW: 1000.0000 mg | CHEWABLE_TABLET | Freq: Three times a day (TID) | ORAL | Status: AC

## 2022-09-25 MED ORDER — DARBEPOETIN ALFA 100 MCG/0.5ML IJ SOSY
100.0000 ug | PREFILLED_SYRINGE | INTRAMUSCULAR | Status: DC
  Administered 2022-09-25 – 2022-10-02 (×2): 100 ug via SUBCUTANEOUS
  Filled 2022-09-25 (×2): qty 0.5

## 2022-09-25 MED ORDER — PANTOPRAZOLE SODIUM 40 MG PO TBEC: 40.0000 mg | DELAYED_RELEASE_TABLET | Freq: Every day | ORAL | Status: AC

## 2022-09-25 MED ORDER — ATENOLOL 25 MG PO TABS
12.5000 mg | ORAL_TABLET | Freq: Every day | ORAL | Status: DC
  Administered 2022-09-26: 12.5 mg via ORAL
  Filled 2022-09-25 (×2): qty 1

## 2022-09-25 MED ORDER — CALCITRIOL 0.5 MCG PO CAPS
0.5000 ug | ORAL_CAPSULE | ORAL | Status: DC
  Administered 2022-09-25 – 2022-10-07 (×6): 0.5 ug via ORAL
  Filled 2022-09-25 (×8): qty 1

## 2022-09-25 MED ORDER — CALCIUM CARBONATE ANTACID 500 MG PO CHEW
1.0000 | CHEWABLE_TABLET | Freq: Four times a day (QID) | ORAL | Status: DC | PRN
  Administered 2022-10-04 – 2022-10-09 (×3): 200 mg via ORAL
  Filled 2022-09-25 (×3): qty 1

## 2022-09-25 MED ORDER — CALCITRIOL 0.5 MCG PO CAPS: 0.5000 ug | ORAL_CAPSULE | ORAL | Status: AC

## 2022-09-25 MED ORDER — GUAIFENESIN 100 MG/5ML PO LIQD: 5.0000 mL | ORAL | Status: DC | PRN

## 2022-09-25 MED ORDER — HEPARIN SODIUM (PORCINE) 5000 UNIT/ML IJ SOLN
5000.0000 [IU] | Freq: Three times a day (TID) | INTRAMUSCULAR | Status: DC
  Administered 2022-09-25 – 2022-10-06 (×27): 5000 [IU] via SUBCUTANEOUS
  Filled 2022-09-25 (×30): qty 1

## 2022-09-25 MED ORDER — GUAIFENESIN-DM 100-10 MG/5ML PO SYRP
5.0000 mL | ORAL_SOLUTION | Freq: Four times a day (QID) | ORAL | Status: DC | PRN
  Administered 2022-09-25 – 2022-10-07 (×8): 10 mL via ORAL
  Filled 2022-09-25 (×9): qty 10

## 2022-09-25 MED ORDER — OXYCODONE HCL 5 MG PO TABS: 5.0000 mg | ORAL_TABLET | ORAL | 0 refills | Status: DC | PRN

## 2022-09-25 MED ORDER — MILK AND MOLASSES ENEMA: 1.0000 | Freq: Every day | RECTAL | Status: DC | PRN

## 2022-09-25 MED ORDER — BLISTEX MEDICATED EX OINT: TOPICAL_OINTMENT | CUTANEOUS | Status: DC | PRN

## 2022-09-25 MED ORDER — HEPARIN SODIUM (PORCINE) 1000 UNIT/ML DIALYSIS
4000.0000 [IU] | Freq: Once | INTRAMUSCULAR | Status: AC
  Administered 2022-09-25: 3800 [IU] via INTRAVENOUS_CENTRAL
  Filled 2022-09-25 (×2): qty 4

## 2022-09-25 MED ORDER — ACETAMINOPHEN 325 MG PO TABS
325.0000 mg | ORAL_TABLET | ORAL | Status: DC | PRN
  Administered 2022-10-01: 650 mg via ORAL
  Filled 2022-09-25: qty 2

## 2022-09-25 MED ORDER — CEFAZOLIN SODIUM-DEXTROSE 2-4 GM/100ML-% IV SOLN
2.0000 g | INTRAVENOUS | Status: DC
  Administered 2022-09-25: 2 g via INTRAVENOUS
  Filled 2022-09-25: qty 100

## 2022-09-25 MED ORDER — DIPHENHYDRAMINE HCL 25 MG PO CAPS: 25.0000 mg | ORAL_CAPSULE | Freq: Four times a day (QID) | ORAL | Status: DC | PRN

## 2022-09-25 NOTE — Discharge Summary (Signed)
Physician Discharge Summary  Ryan [name] ZOX:[id number] DOB: 02-11-1961 DOA: 09/07/2022  PCP: Sharin Grave, MD  Admit date: 09/07/2022 Discharge date: 09/25/2022  Admitted From: Home Disposition: Acute inpatient rehab  Recommendations for Outpatient Follow-up:  Ongoing hemodialysis and antibiotics to be completed without interruption.  Discharge Condition: Stable CODE STATUS: Full code Diet recommendation: Low-salt diet  Discharge summary: 62 year old gentleman with history of hypertension, hyperlipidemia, ESRD on hemodialysis who presented to the hospital with low back pain, ultimately found to have evidence of L5/S1 discitis and osteomyelitis as well as MSSA bacteremia.  ID was consulted.  He was also found to have endocarditis.  Clinically improving now and going to CIR.  On IV antibiotics with hemodialysis.  Patient also developed a small bowel obstruction treated conservatively while in the hospital.   Assessment & plan of care:   L5/S1 discitis and osteomyelitis, MSSA bacteremia with mitral valve endocarditis, septic emboli to the lungs, septic emboli to the brain: Blood cultures positive for MSSA, repeat cultures negative.  Possibly secondary to line infection with septic emboli to the lung. MRI with L5/S1 early osteomyelitis.  Persistent leukocytosis. Catheter removed 6/18, HD catheter right femoral placed 6/21. Cefazolin until 8/13 as per ID recommendation. ID on board, on cefazolin IV with hemodialysis, plan for total 8 weeks of treatment.   TTE with mitral valve endocarditis, CT scan of the chest with septic emboli, new airspace opacity left upper lobe. CT surgery recommended conservative management. TEE not done given nonsurgical candidate and already on antibiotics and responding.   ESRD on hemodialysis: Nephrology following. Previous catheter removed.  Now has tunneled HD catheter placed on 7/1.  Receiving additional hemodialysis.   Small bowel obstruction:  Relieved.  Now with normal bowel function.   Nutrition Status: Nutrition Problem: Moderate Malnutrition Etiology: acute illness (discitis/osteomyelitis, SBO) Signs/Symptoms: mild fat depletion, mild muscle depletion, moderate muscle depletion Interventions: MVI, Nepro shake, Liberalize Diet, Magic cup, Refer to RD note for recommendations     Acute hypoxic respiratory failure with cavitary pneumonia: As above.  Improving.  On antibiotics.  Aggressive chest physiotherapy to continue.    Medically stabilizing.  Appropriate to transfer to acute inpatient rehab level of care for multidisciplinary rehab and medical management.   Discharge Diagnoses:  Principal Problem:   Discitis of lumbosacral region Active Problems:   Hypertension   ESRD on dialysis (HCC)   Hyperlipidemia   Acute low back pain   Lung nodules   Elevated troponin   MSSA bacteremia   Malnutrition of moderate degree    Discharge Instructions  Discharge Instructions     Diet - low sodium heart healthy   Complete by: As directed    Home infusion instructions   Complete by: As directed    Instructions: Flushing of vascular access device: 0.9% NaCl pre/post medication administration and prn patency; Heparin 100 u/ml, 5ml for implanted ports and Heparin 10u/ml, 5ml for all other central venous catheters.   Increase activity slowly   Complete by: As directed    No wound care   Complete by: As directed       Allergies as of 09/25/2022       Reactions   Azithromycin Nausea And Vomiting, Other (See Comments)   Chest tightness Other Reaction(s): GI Intolerance   Iodinated Contrast Media Itching, Nausea Only   Probably needs prednisone prep prior to contrast        Medication List     STOP taking these medications    Cetirizine HCl 10 MG  Caps   cyclobenzaprine 10 MG tablet Commonly known as: FLEXERIL   Iron 325 (65 Fe) MG Tabs   OVER THE COUNTER MEDICATION   sevelamer carbonate 800 MG  tablet Commonly known as: RENVELA   Vitamin D3 125 MCG (5000 UT) Caps       TAKE these medications    atenolol 25 MG tablet Commonly known as: TENORMIN Take 50 mg by mouth daily.   atorvastatin 40 MG tablet Commonly known as: LIPITOR Take 40 mg by mouth daily.   calcitRIOL 0.5 MCG capsule Commonly known as: ROCALTROL Take 1 capsule (0.5 mcg total) by mouth every Tuesday, Thursday, and Saturday at 6 PM.   ceFAZolin  IVPB Commonly known as: ANCEF Inject 2 g into the vein Every Tuesday,Thursday,and Saturday with dialysis. Indication:  MSSA discitis/osteo First Dose: Yes Last Day of Therapy:  11/04/22 Labs - Once weekly:  CBC/D and BMP, ESR and CRP Method of administration: Per HD protocol Method of administration may be changed at the discretion of home infusion pharmacist based upon assessment of the patient and/or caregiver's ability to self-administer the medication ordered.   chlorproMAZINE 25 MG tablet Commonly known as: THORAZINE Take 1 tablet (25 mg total) by mouth 3 (three) times daily as needed for hiccoughs.   cinacalcet 30 MG tablet Commonly known as: SENSIPAR Take 30 mg by mouth at bedtime.   EPINEPHrine 0.3 mg/0.3 mL Soaj injection Commonly known as: EPI-PEN Inject 0.3 mg into the muscle as needed for anaphylaxis.   loratadine 10 MG tablet Commonly known as: CLARITIN Take 10 mg by mouth daily.   multivitamin Tabs tablet Take 1 tablet by mouth daily.   oxyCODONE 5 MG immediate release tablet Commonly known as: Oxy IR/ROXICODONE Take 1 tablet (5 mg total) by mouth every 4 (four) hours as needed for moderate pain or severe pain.   pantoprazole 40 MG tablet Commonly known as: PROTONIX Take 1 tablet (40 mg total) by mouth daily.   sucroferric oxyhydroxide 500 MG chewable tablet Commonly known as: VELPHORO Chew 2 tablets (1,000 mg total) by mouth 3 (three) times daily with meals.   tetrahydrozoline-zinc 0.05-0.25 % ophthalmic solution Commonly known  as: VISINE-AC Place 2 drops into both eyes 3 (three) times daily as needed (dry eyes).   triamcinolone cream 0.1 % Commonly known as: KENALOG Apply 1 application topically daily as needed (dry spots on face/excema).               Home Infusion Instuctions  (From admission, onward)           Start     Ordered   09/16/22 0000  Home infusion instructions       Question:  Instructions  Answer:  Flushing of vascular access device: 0.9% NaCl pre/post medication administration and prn patency; Heparin 100 u/ml, 5ml for implanted ports and Heparin 10u/ml, 5ml for all other central venous catheters.   09/16/22 1422              Durable Medical Equipment  (From admission, onward)           Start     Ordered   09/12/22 1119  For home use only DME Bedside commode  Once       Comments: Pt's generalized weakness and decreased activity tolerance necessitate recommendation for bedside commode as he is not able to ambulate to the bathroom.  Question:  Patient needs a bedside commode to treat with the following condition  Answer:  Weakness   09/12/22 1119  09/12/22 1117  For home use only DME Walker rolling  Once       Question Answer Comment  Walker: With 5 Inch Wheels   Patient needs a walker to treat with the following condition Gait instability      09/12/22 1119            Follow-up Information     [facility].   Specialty: Home Health Services Why: Someone will call you to schedule first home visit. If you have not received a call after two days of discharging home, call their number listed. If no one comes to assess, call Case Manager at [phone]. Contact information: [address] [place] [address] [phone]                Allergies  Allergen Reactions   Azithromycin Nausea And Vomiting and Other (See Comments)    Chest tightness  Other Reaction(s): GI Intolerance   Iodinated Contrast Media Itching and Nausea  Only    Probably needs prednisone prep prior to contrast    Consultations: Cardiology Cardiothoracic surgery Nephrology Infectious disease Neurology   Procedures/Studies: IR Fluoro Guide CV Line Left  Result Date: 09/22/2022 CLINICAL DATA:  Renal insufficiency, needs durable venous access for hemodialysis EXAM: TUNNELED HEMODIALYSIS CATHETER PLACEMENT WITH ULTRASOUND AND FLUOROSCOPIC GUIDANCE TECHNIQUE: The procedure, risks, benefits, and alternatives were explained to the patient. Questions regarding the procedure were encouraged and answered. The patient understands and consents to the procedure. As antibiotic prophylaxis, cefazolin 2 g was ordered pre-procedure and administered intravenously within one hour of incision.Patency of the left IJ vein was confirmed with ultrasound with image documentation. An appropriate skin site was determined. Region was prepped using maximum barrier technique including cap and mask, sterile gown, sterile gloves, large sterile sheet, and Chlorhexidine as cutaneous antisepsis. The region was infiltrated locally with 1% lidocaine. Intravenous Fentanyl and Versed 1.5mg  were administered as conscious sedation during continuous monitoring of the patient's level of consciousness and physiological / cardiorespiratory status by the radiology RN, with a total moderate sedation time of 20 minutes. Under real-time ultrasound guidance, the left IJ vein was accessed with a 21 gauge micropuncture needle; the needle tip within the vein was confirmed with ultrasound image documentation. Needle exchanged over the 018 guidewire for transitional dilator. Limited venogram performed demonstrating central venous patency. Benson wire was advanced into the IVC. Over this, an MPA catheter was advanced. A Palindrome 23 hemodialysis catheter was tunneled from the left anterior chest wall approach to the left IJ dermatotomy site. The MPA catheter was exchanged over an Amplatz wire for  serial vascular dilators which allow placement of a peel-away sheath, through which the catheter was advanced under intermittent fluoroscopy, positioned with its tips in the proximal and midright atrium. Spot chest radiograph confirms good catheter position. No pneumothorax. Catheter was flushed and primed per protocol. Catheter secured externally with O Prolene sutures. The left IJ dermatotomy site was closed with Dermabond. The temporary right femoral hemodialysis catheter was removed and hemostasis achieved at the site with manual compression. The patient tolerated the procedure well. COMPLICATIONS: COMPLICATIONS None immediate FLUOROSCOPY: Radiation Exposure Index (as provided by the fluoroscopic device): 77 mGy air Kerma COMPARISON:  None Available. IMPRESSION: 1. Technically successful placement of tunneled left IJ hemodialysis catheter with ultrasound and fluoroscopic guidance. Ready for routine use. ACCESS: Remains approachable for percutaneous intervention as needed. Electronically Signed   By: Corlis Leak M.D.   On: 09/22/2022 16:15   IR  US Guide Vasc Access Left  Result Date: 09/22/2022 CLINICAL DATA:  Renal insufficiency, needs durable venous access for hemodialysis EXAM: TUNNELED HEMODIALYSIS CATHETER PLACEMENT WITH ULTRASOUND AND FLUOROSCOPIC GUIDANCE TECHNIQUE: The procedure, risks, benefits, and alternatives were explained to the patient. Questions regarding the procedure were encouraged and answered. The patient understands and consents to the procedure. As antibiotic prophylaxis, cefazolin 2 g was ordered pre-procedure and administered intravenously within one hour of incision.Patency of the left IJ vein was confirmed with ultrasound with image documentation. An appropriate skin site was determined. Region was prepped using maximum barrier technique including cap and mask, sterile gown, sterile gloves, large sterile sheet, and Chlorhexidine as cutaneous antisepsis. The region was infiltrated  locally with 1% lidocaine. Intravenous Fentanyl and Versed 1.5mg  were administered as conscious sedation during continuous monitoring of the patient's level of consciousness and physiological / cardiorespiratory status by the radiology RN, with a total moderate sedation time of 20 minutes. Under real-time ultrasound guidance, the left IJ vein was accessed with a 21 gauge micropuncture needle; the needle tip within the vein was confirmed with ultrasound image documentation. Needle exchanged over the 018 guidewire for transitional dilator. Limited venogram performed demonstrating central venous patency. Benson wire was advanced into the IVC. Over this, an MPA catheter was advanced. A Palindrome 23 hemodialysis catheter was tunneled from the left anterior chest wall approach to the left IJ dermatotomy site. The MPA catheter was exchanged over an Amplatz wire for serial vascular dilators which allow placement of a peel-away sheath, through which the catheter was advanced under intermittent fluoroscopy, positioned with its tips in the proximal and midright atrium. Spot chest radiograph confirms good catheter position. No pneumothorax. Catheter was flushed and primed per protocol. Catheter secured externally with O Prolene sutures. The left IJ dermatotomy site was closed with Dermabond. The temporary right femoral hemodialysis catheter was removed and hemostasis achieved at the site with manual compression. The patient tolerated the procedure well. COMPLICATIONS: COMPLICATIONS None immediate FLUOROSCOPY: Radiation Exposure Index (as provided by the fluoroscopic device): 77 mGy air Kerma COMPARISON:  None Available. IMPRESSION: 1. Technically successful placement of tunneled left IJ hemodialysis catheter with ultrasound and fluoroscopic guidance. Ready for routine use. ACCESS: Remains approachable for percutaneous intervention as needed. Electronically Signed   By: Corlis Leak M.D.   On: 09/22/2022 16:15   DG CHEST  PORT 1 VIEW  Result Date: 09/22/2022 CLINICAL DATA:  Acute respiratory distress. History of end-stage renal disease. EXAM: PORTABLE CHEST 1 VIEW COMPARISON:  CT 09/07/2022 FINDINGS: There is enlargement of the cardiac silhouette which is favored to represent artifact from portable technique, although underlying pericardial effusion would be difficult to exclude. Lung volumes are low. There is new opacification of the retrocardiac left lower lobe. New airspace opacity is identified within the left upper lobe. New nodular density with central cavitation is suspected within the apical segment of the left upper lobe measuring 1.2 cm. The right lung appears relatively clear. IMPRESSION: 1. New airspace opacity within the left upper lobe and retrocardiac left lower lobe concerning for pneumonia. 2. New nodular density with central cavitation within the apical segment of the left upper lobe. Consider follow-up CT of the chest for more definitive characterization. 3. Enlargement of the cardiac silhouette compared with previous exam may reflect artifact from AP portable technique. Underlying pericardial effusion would be difficult to exclude. Electronically Signed   By: Signa Kell M.D.   On: 09/22/2022 07:17   EEG adult  Result Date: 09/19/2022 Lindie Spruce  Val Eagle, MD     09/19/2022  7:11 AM Patient Name: Ryan [name] MRN: [id number] Epilepsy Attending: Charlsie Quest Referring Physician/Provider: Jerald Kief, MD Date: 09/18/2022 Duration: 24.29 mins Patient history: 62yo M with ams getting eeg to evaluate for seizure. Level of alertness: Awake AEDs during EEG study: None Technical aspects: This EEG study was done with scalp electrodes positioned according to the 10-20 International system of electrode placement. Electrical activity was reviewed with band pass filter of 1-70Hz , sensitivity of 7 uV/mm, display speed of 58mm/sec with a 60Hz  notched filter applied as appropriate. EEG data were recorded  continuously and digitally stored.  Video monitoring was available and reviewed as appropriate. Description: The posterior dominant rhythm consists of 7Hz  activity of moderate voltage (25-35 uV) seen predominantly in posterior head regions, symmetric and reactive to eye opening and eye closing. EEG showed continuous generalized 3 to 6 Hz theta-delta slowing. Physiologic photic driving was not seen during photic stimulation.  Hyperventilation was not performed.   ABNORMALITY - Continuous slow, generalized - Background slow IMPRESSION: This study is suggestive of moderate diffuse encephalopathy, nonspecific etiology. No seizures or epileptiform discharges were seen throughout the recording. Charlsie Quest   MR BRAIN WO CONTRAST  Result Date: 09/19/2022 CLINICAL DATA:  Initial evaluation for neuro deficit, stroke. EXAM: MRI HEAD WITHOUT CONTRAST TECHNIQUE: Multiplanar, multiecho pulse sequences of the brain and surrounding structures were obtained without intravenous contrast. COMPARISON:  CT from earlier the same day. FINDINGS: Brain: Examination technically limited by motion and patient's inability to tolerate the full length of the study. Cerebral volume within normal limits. No significant cerebral white matter disease for age. Scattered predominantly subcentimeter foci of restricted diffusion are seen involving the bilateral cerebral hemispheres and right cerebellum, consistent with small acute ischemic infarcts. For reference purposes, the largest infarct seen at the right cerebellum and measures 8 mm. Probable additional subtle evolving infarct noted at the right ventral pons (series 5, image 73). No associated hemorrhage or mass effect. No mass lesion, midline shift or mass effect. No hydrocephalus or extra-axial fluid collection. Vascular: Major intracranial vascular flow voids are maintained. Skull and upper cervical spine: Craniocervical junction grossly within normal limits. Bone marrow signal  intensity grossly normal. No scalp soft tissue abnormality. Sinuses/Orbits: Globes orbital soft tissues within normal limits. Paranasal sinuses are largely clear. No significant mastoid effusion. Other: None. IMPRESSION: 1. Technically limited exam due to motion and the patient's inability to tolerate the full study. 2. Scattered subcentimeter acute ischemic infarcts involving the bilateral cerebral hemispheres and right cerebellum. No associated hemorrhage or mass effect. Electronically Signed   By: Rise Mu M.D.   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 RADIATION DOSE REDUCTION: This exam was performed according to the departmental dose-optimization program which includes automated exposure control, adjustment of the mA and/or kV according to patient size and/or use of iterative reconstruction technique. COMPARISON:  None FINDINGS: Brain: No hemorrhage. No hydrocephalus. No extra-axial fluid collection. No mass effect. There is subcortical hypodensity along the superior frontal lobes bilaterally (series 7, image 27) which is nonspecific but favored to represent sequela of chroinc microvascular ischemic change Vascular: No hyperdense vessel or unexpected calcification. Skull: Normal. Negative for fracture or focal lesion.  Sinuses/Orbits: No middle ear or mastoid effusion paranasal sinuses are clear orbits are unremarkable Other: None. IMPRESSION: No acute abnormality or definite finding to suggest septic emboli on this non contrast enhanaced head CT. If there is high clinical concern for an infarct or intracranial infection, further evaluation with a contrast enhanaced brain MRI is recommended. Electronically  Signed   By: Lorenza Cambridge M.D.   On: 09/18/2022 16:11   IR Fluoro Guide CV Line Right  Result Date: 09/17/2022 CLINICAL DATA:  Poor function of non tunneled right femoral hemodialysis catheter. Patient has contrast allergy and was not pre-medicated EXAM: EXCHANGE OF NONTUNNELED CENTRAL VENOUS CATHETER UNDER FLUOROSCOPY FLUOROSCOPY: Radiation Exposure Index (as provided by the fluoroscopic device): 6 mGy air Kerma TECHNIQUE: After written informed consent was obtained, patient was placed in the supine position on angiographic table. right femoral venous catheter and surrounding skin prepped using maximum barrier technique including cap and mask, sterile gown, sterile gloves, large sterile sheet, and Chlorhexidine as cutaneous antisepsis. The region was infiltrated locally with 1% lidocaine. Fluoroscopic inspection showed stable position of the catheter compared to previous exam. Under fluoroscopic guidance, the catheter was exchanged over a 035 guidewire for a new 24 cm Trialysis catheter, position with the tip at the level of the L3-4 interspace. Both lumens flushed and aspirated easily. Catheter was flushed per protocol and secured externally. The patient tolerated procedure well. COMPLICATIONS: COMPLICATIONS none IMPRESSION: 1. Technically successful exchange of non tunneled right femoral hemodialysis catheter. If dysfunction persists, recommend pre-treating for contrast allergy before exchange to allow venography as needed. Electronically Signed   By: Corlis Leak M.D.   On: 09/17/2022 14:32   IR Fluoro Guide CV Line Right  Result Date: 09/12/2022 INDICATION: History of end-stage renal disease, now admitted with bacteremia, post removal of tunneled right internal jugular approach dialysis catheter on 09/09/2022. Unfortunately, patient has white blood cell count remains persistently elevated and as such she presents today for image guided placement of a temporary hemodialysis catheter to facilitate  continued dialysis. Patient with history of contrast allergy though is not received a preprocedural steroid prep. EXAM: NON-TUNNELED CENTRAL VENOUS HEMODIALYSIS CATHETER PLACEMENT WITH ULTRASOUND AND FLUOROSCOPIC GUIDANCE COMPARISON:  Dialysis catheter removal-09/09/2022; chest radiograph-12/29/2017 MEDICATIONS: None FLUOROSCOPY TIME:  3 minutes, 18 seconds (123 mGy) COMPLICATIONS: None immediate. PROCEDURE: Informed written consent was obtained from the patient after a discussion of the risks, benefits, and alternatives to treatment. Questions regarding the procedure were encouraged and answered. The right neck and chest were prepped with chlorhexidine in a sterile fashion, and a sterile drape was applied covering the operative field. Maximum barrier sterile technique with sterile gowns and gloves were used for the procedure. A timeout was performed prior to the initiation of the procedure. After the overlying soft tissues were anesthetized, a small venotomy incision was created and a micropuncture kit was utilized to access the internal jugular vein. Real-time ultrasound guidance was utilized for vascular access including the acquisition of a permanent ultrasound image documenting patency of the accessed vessel. There was difficulty advancing the microwire through the SVC. As such, stiff glidewire was cannulated through the outer sheath of the micropuncture kit however again, could not be advanced to the SVC. Given patient's history of contrast allergy, intravenous contrast was not administered. As such, the decision was made to proceed with right common femoral vein approach temporary dialysis catheter placement to facilitate dialysis. The right groin was prepped and draped in usual sterile fashion. The right femoral head was marked fluoroscopically. Under  direct ultrasound guidance, the right common femoral vein was accessed with a micropuncture kit after the overlying soft tissues were anesthetized with 1%  lidocaine within epinephrine. An ultrasound image was saved documenting patency of the accessed vessel. Under fluoroscopic guidance, the track was dilated ultimately allowing placement of a The microwire was utilized to measure appropriate catheter length. A stiff glidewire was advanced to the level of the IVC. Under fluoroscopic guidance, the venotomy was serially dilated, ultimately allowing placement of a 24 cm temporary [place] dialysis catheter with tip ultimately terminating within the inferior aspect of the IVC. Final catheter positioning was confirmed and documented with a spot fluoroscopic image. The catheter aspirates and flushes normally. The catheter was flushed with appropriate volume heparin dwells. The catheter exit site was secured with a 0-Prolene retention suture. A dressing was placed. The patient tolerated the procedure well without immediate post procedural complication. IMPRESSION: 1. Attempted though ultimately unsuccessful placement of right internal jugular approach temporary hemodialysis catheter secondary to concern for SVC occlusion. Contrast was unable to be administered secondary to patient's history of contrast allergy and lack of preprocedural steroid prep. 2. Successful placement of a right common femoral vein approach 24 cm temporary dialysis catheter with tip terminating within the inferior aspect of the IVC. The catheter is ready for immediate use. PLAN: Recommend correlating with interventional radiology for once the patient's bacteremia has cleared, the patient will need a steroid prep prior to attempted left internal jugular approach hemodialysis catheter placement as this procedure will likely require the administration of contrast. Ultimately, if the patient's SVC is found to be occluded and unable to be traversed, the right common femoral vein dialysis catheter may be converted to a tunneled catheter as indicated. Electronically Signed   By: Simonne Come M.D.   On:  09/12/2022 16:16   IR US Guide Vasc Access Right  Result Date: 09/12/2022 INDICATION: History of end-stage renal disease, now admitted with bacteremia, post removal of tunneled right internal jugular approach dialysis catheter on 09/09/2022. Unfortunately, patient has white blood cell count remains persistently elevated and as such she presents today for image guided placement of a temporary hemodialysis catheter to facilitate continued dialysis. Patient with history of contrast allergy though is not received a preprocedural steroid prep. EXAM: NON-TUNNELED CENTRAL VENOUS HEMODIALYSIS CATHETER PLACEMENT WITH ULTRASOUND AND FLUOROSCOPIC GUIDANCE COMPARISON:  Dialysis catheter removal-09/09/2022; chest radiograph-12/29/2017 MEDICATIONS: None FLUOROSCOPY TIME:  3 minutes, 18 seconds (123 mGy) COMPLICATIONS: None immediate. PROCEDURE: Informed written consent was obtained from the patient after a discussion of the risks, benefits, and alternatives to treatment. Questions regarding the procedure were encouraged and answered. The right neck and chest were prepped with chlorhexidine in a sterile fashion, and a sterile drape was applied covering the operative field. Maximum barrier sterile technique with sterile gowns and gloves were used for the procedure. A timeout was performed prior to the initiation of the procedure. After the overlying soft tissues were anesthetized, a small venotomy incision was created and a micropuncture kit was utilized to access the internal jugular vein. Real-time ultrasound guidance was utilized for vascular access including the acquisition of a permanent ultrasound image documenting patency of the accessed vessel. There was difficulty advancing the microwire through the SVC. As such, stiff glidewire was cannulated through the outer sheath of the micropuncture kit however again, could not be advanced to the SVC. Given patient's history of contrast allergy, intravenous contrast was not  administered. As such, the decision was made to proceed with right common femoral  vein approach temporary dialysis catheter placement to facilitate dialysis. The right groin was prepped and draped in usual sterile fashion. The right femoral head was marked fluoroscopically. Under direct ultrasound guidance, the right common femoral vein was accessed with a micropuncture kit after the overlying soft tissues were anesthetized with 1% lidocaine within epinephrine. An ultrasound image was saved documenting patency of the accessed vessel. Under fluoroscopic guidance, the track was dilated ultimately allowing placement of a The microwire was utilized to measure appropriate catheter length. A stiff glidewire was advanced to the level of the IVC. Under fluoroscopic guidance, the venotomy was serially dilated, ultimately allowing placement of a 24 cm temporary [place] dialysis catheter with tip ultimately terminating within the inferior aspect of the IVC. Final catheter positioning was confirmed and documented with a spot fluoroscopic image. The catheter aspirates and flushes normally. The catheter was flushed with appropriate volume heparin dwells. The catheter exit site was secured with a 0-Prolene retention suture. A dressing was placed. The patient tolerated the procedure well without immediate post procedural complication. IMPRESSION: 1. Attempted though ultimately unsuccessful placement of right internal jugular approach temporary hemodialysis catheter secondary to concern for SVC occlusion. Contrast was unable to be administered secondary to patient's history of contrast allergy and lack of preprocedural steroid prep. 2. Successful placement of a right common femoral vein approach 24 cm temporary dialysis catheter with tip terminating within the inferior aspect of the IVC. The catheter is ready for immediate use. PLAN: Recommend correlating with interventional radiology for once the patient's bacteremia has cleared,  the patient will need a steroid prep prior to attempted left internal jugular approach hemodialysis catheter placement as this procedure will likely require the administration of contrast. Ultimately, if the patient's SVC is found to be occluded and unable to be traversed, the right common femoral vein dialysis catheter may be converted to a tunneled catheter as indicated. Electronically Signed   By: Simonne Come M.D.   On: 09/12/2022 16:16   DG Abd Portable 1V-Small Bowel Obstruction Protocol-initial, 8 hr delay  Result Date: 09/11/2022 CLINICAL DATA:  Evaluate small-bowel obstruction EXAM: PORTABLE ABDOMEN - 1 VIEW COMPARISON:  Film from earlier in the same day. FINDINGS: Multiple dilated loops of small bowel are again identified. Previously administered contrast lies within the stomach although has also passed distally into the colon consistent with a partial small bowel obstruction. Right ureteral stent is noted. IMPRESSION: Persistent small bowel dilatation with contrast now seen in the colon consistent with partial small bowel obstruction. Electronically Signed   By: Alcide Clever M.D.   On: 09/11/2022 22:36   ABORTED INVASIVE LAB PROCEDURE  Result Date: 09/11/2022 This case was aborted.  DG Abd 1 View  Result Date: 09/11/2022 CLINICAL DATA:  Small-bowel obstruction EXAM: ABDOMEN - 1 VIEW COMPARISON:  KUB and CT abdomen/pelvis 1 day prior FINDINGS: Gaseous distention of small bowel in the midabdomen measuring up to 5.2 cm is not significantly changed. There is no definite free intraperitoneal air, within the confines of supine technique. There is no abnormal soft tissue calcification. A ureteral stent is noted in the right pelvis. There is no acute osseous abnormality. IMPRESSION: Similar gaseous distention of the small bowel compared to the study from 1 day prior. Electronically Signed   By: Lesia Hausen M.D.   On: 09/11/2022 08:19   CT ABDOMEN PELVIS WO CONTRAST  Result Date:  09/10/2022 CLINICAL DATA:  Bowel obstruction suspected EXAM: CT ABDOMEN AND PELVIS WITHOUT CONTRAST TECHNIQUE: Multidetector CT imaging of the  abdomen and pelvis was performed following the standard protocol without IV contrast. RADIATION DOSE REDUCTION: This exam was performed according to the departmental dose-optimization program which includes automated exposure control, adjustment of the mA and/or kV according to patient size and/or use of iterative reconstruction technique. COMPARISON:  Abdominal radiographs 09/10/2022 and CT chest abdomen and pelvis 09/07/2022 FINDINGS: Lower chest: Multiple new pulmonary nodules in the right lower lobe measuring up to 17 mm which were not present on 09/07/2022. Given rapid development these are infectious/inflammatory. The previously seen 6 mm nodule in the left lower lobe has resolved. Hepatobiliary: Mild hepatic steatosis. Decompressed gallbladder. No biliary dilation. Pancreas: Unremarkable. Spleen: Unremarkable. Adrenals/Urinary Tract: Normal adrenal glands. Atrophic native kidneys. No urinary calculi or hydronephrosis. 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 A ureteral stent again extends from the transplant kidney into the bladder. Irregular bladder wall thickening. Stomach/Bowel: Diffuse dilation of the small bowel in the anterior abdomen and pelvis there is abrupt transition point in the right anterior abdomen (series 6/image 37 and series 3/image 55) in an area of mild wall thickening. Enteric contrast is present within the small bowel reaching a point in the left lower quadrant where there is moderate dilation and wall thickening of the small bowel (circa series 3/image 64). Stomach is within normal limits. Normal caliber colon. Colonic diverticulosis without diverticulitis. Normal appendix. Vascular/Lymphatic: Aortic atherosclerosis. No enlarged abdominal or pelvic lymph nodes. Reproductive: Unremarkable.  Other: No free intraperitoneal fluid or air. Fat containing left inguinal hernia. Musculoskeletal: No acute osseous abnormality or destructive osseous lesion. Dense bones consistent with renal osteodystrophy. Lucency in the L2 vertebral body are stable since 2020 consistent with Schmorl's nodes or changes of hyperparathyroidism. IMPRESSION: 1. Small-bowel obstruction with transition point in the right anterior abdomen in an area of mild wall thickening. 2. Enteric contrast is present within the small bowel reaching a point in the left lower quadrant where there is moderate dilation and apparent wall thickening. Wall thickening may be due to infectious or inflammatory enteritis however neoplastic infiltration is not excluded. Continued attention on follow-up. 3. Multiple new pulmonary nodules in the right lower lobe measuring up to 17 mm which were not present on 09/07/2022. Given rapid development these are compatible with infection and/or aspiration. 4. Irregular bladder wall thickening. Recommend correlation with urinalysis. Aortic Atherosclerosis (ICD10-I70.0). Electronically Signed   By: Minerva Fester M.D.   On: 09/10/2022 19:56   DG Abd Portable 1V  Result Date: 09/10/2022 CLINICAL DATA:  Abdominal distension. EXAM: PORTABLE ABDOMEN - 1 VIEW COMPARISON:  August 26, 2016. FINDINGS: Small bowel dilatation is noted concerning for distal small bowel obstruction. No colonic dilatation is noted. Right-sided ureteral stent is again noted. IMPRESSION: Small bowel dilatation is noted concerning for distal small bowel obstruction or possibly ileus. Electronically Signed   By: Lupita Raider M.D.   On: 09/10/2022 14:26   IR Removal Tun Cv Cath W/O FL  Result Date: 09/09/2022 INDICATION: 62 year old male with history of ESRD and tunneled hemodialysis catheter in place presents with bacteremia. Request for tunneled dialysis catheter removal. EXAM: REMOVAL OF TUNNELED HEMODIALYSIS CATHETER MEDICATIONS: 10 mL 1 %  lidocaine COMPLICATIONS: None immediate. PROCEDURE: Informed written consent was obtained from the patient following an explanation of the procedure, risks, benefits and alternatives to treatment. A time out was performed prior to the initiation of the procedure. Sterile technique was utilized including mask, sterile gloves, sterile drape, and hand hygiene. ChloraPrep was used to prep the patient's  right neck, chest and existing catheter. 1% lidocaine was injected around the catheter and the subcutaneous tunnel. The catheter was dissected out using scissors and curved hemostats until the cuff was freed from the surrounding fibrous sheath. The catheter was removed intact. Hemostasis was obtained with manual compression. A dressing was placed. The patient tolerated the procedure well without immediate post procedural complication. IMPRESSION: Successful removal of tunneled dialysis catheter. Performed by: Lawernce Ion, PA-C Electronically Signed   By: Irish Lack M.D.   On: 09/09/2022 13:32   ECHOCARDIOGRAM COMPLETE BUBBLE STUDY  Result Date: 09/08/2022    ECHOCARDIOGRAM REPORT   Patient Name:   MURRAY DAMEWOOD Date of Exam: 09/08/2022 Medical Rec #:  [id number]           Height:       69.0 in Accession #:    [id number]          Weight:       185.0 lb Date of Birth:  05/29/1960            BSA:          1.999 m Patient Age:    62 years            BP:           145/84 mmHg Patient Gender: M                   HR:           74 bpm. Exam Location:  Inpatient Procedure: 2D Echo, Cardiac Doppler, Color Doppler and Saline Contrast Bubble            Study Indications:    I50.32 Chronic diastolic heart failure  History:        Patient has prior history of Echocardiogram examinations, most                 recent 08/06/2022. Risk Factors:Hypertension and Dyslipidemia.  Sonographer:    Dondra Prader RVT RCS Referring Phys: [id number] [facility]  Sonographer Comments: Technically difficult study due to poor echo windows. Patient  had trouble positioning for exam and having back spasms. IMPRESSIONS  1. Left ventricular ejection fraction, by estimation, is 55 to 60%. The left ventricle has normal function. The left ventricle has no regional wall motion abnormalities. There is moderate concentric left ventricular hypertrophy. Left ventricular diastolic parameters are consistent with Grade I diastolic dysfunction (impaired relaxation).  2. Right ventricular systolic function is normal. The right ventricular size is normal.  3. Left atrial size was mildly dilated.  4. Bubble study performed per tech's notes but I do not see any bubbles appear on the study. Consider repeat study, if clinically indicated (can do during TEE if needed).  5. There is a large, rounded structure on the anterior leflet of the mitral valve tht is partially obstructing mitral inflow with a ball-valve type motion. This was not present on echo in 5/24. Concern for endocarditis. Suggest TEE to more formally evalaute. The mitral valve is normal in structure. No evidence of mitral valve regurgitation. No evidence of mitral stenosis.  6. The right coronary cusp of the aortic valve appears fused. The aortic valve is normal in structure. There is moderate calcification of the aortic valve. Aortic valve regurgitation is mild. Mild aortic valve stenosis. Aortic regurgitation PHT measures  526 msec. Aortic valve area, by VTI measures 1.90 cm. Aortic valve mean gradient measures 10.0 mmHg. Aortic valve Vmax measures 2.17 m/s.  7. Aortic dilatation  noted. There is mild dilatation of the ascending aorta, measuring 39 mm.  8. The inferior vena cava is normal in size with greater than 50% respiratory variability, suggesting right atrial pressure of 3 mmHg. Conclusion(s)/Recommendation(s): There is a large, rounded structure on the anterior leflet of the mitral valve tht is partially obstructing mitral inflow with a ball-valve type motion. This was not present on echo in 5/24. Concern for  endocarditis. Suggest TEE to more formally evalaute. FINDINGS  Left Ventricle: Left ventricular ejection fraction, by estimation, is 55 to 60%. The left ventricle has normal function. The left ventricle has no regional wall motion abnormalities. The left ventricular internal cavity size was normal in size. There is  moderate concentric left ventricular hypertrophy. Left ventricular diastolic parameters are consistent with Grade I diastolic dysfunction (impaired relaxation). Right Ventricle: The right ventricular size is normal. No increase in right ventricular wall thickness. Right ventricular systolic function is normal. Left Atrium: Left atrial size was mildly dilated. Right Atrium: Right atrial size was normal in size. Pericardium: There is no evidence of pericardial effusion. Mitral Valve: There is a large, rounded structure on the anterior leflet of the mitral valve tht is partially obstructing mitral inflow with a ball-valve type motion. This was not present on echo in 5/24. Concern for endocarditis. Suggest TEE to more formally evalaute. The mitral valve is normal in structure. No evidence of mitral valve regurgitation. No evidence of mitral valve stenosis. Tricuspid Valve: The tricuspid valve is normal in structure. Tricuspid valve regurgitation is not demonstrated. No evidence of tricuspid stenosis. Aortic Valve: The right coronary cusp of the aortic valve appears fused. The aortic valve is normal in structure. There is moderate calcification of the aortic valve. Aortic valve regurgitation is mild. Aortic regurgitation PHT measures 526 msec. Mild aortic stenosis is present. Aortic valve mean gradient measures 10.0 mmHg. Aortic valve peak gradient measures 18.9 mmHg. Aortic valve area, by VTI measures 1.90 cm. Pulmonic Valve: The pulmonic valve was normal in structure. Pulmonic valve regurgitation is trivial. No evidence of pulmonic stenosis. Aorta: Aortic dilatation noted. There is mild dilatation of the  ascending aorta, measuring 39 mm. Venous: The inferior vena cava is normal in size with greater than 50% respiratory variability, suggesting right atrial pressure of 3 mmHg. IAS/Shunts: No atrial level shunt detected by color flow Doppler. Agitated saline contrast was given intravenously to evaluate for intracardiac shunting.  LEFT VENTRICLE PLAX 2D LVIDd:         4.60 cm   Diastology LVIDs:         3.00 cm   LV e' medial:    5.61 cm/s LV PW:         1.20 cm   LV E/e' medial:  21.0 LV IVS:        1.40 cm   LV e' lateral:   6.66 cm/s LVOT diam:     1.80 cm   LV E/e' lateral: 17.7 LV SV:         78 LV SV Index:   39 LVOT Area:     2.54 cm  RIGHT VENTRICLE            IVC RV S prime:     9.64 cm/s  IVC diam: 1.10 cm TAPSE (M-mode): 2.0 cm LEFT ATRIUM             Index LA diam:        3.60 cm 1.80 cm/m LA Vol (A2C):   70.2 ml 35.12 ml/m LA  Vol (A4C):   49.4 ml 24.69 ml/m LA Biplane Vol: 61.6 ml 30.82 ml/m  AORTIC VALVE                     PULMONIC VALVE AV Area (Vmax):    1.88 cm      PV Vmax:       0.88 m/s AV Area (Vmean):   1.89 cm      PV Peak grad:  3.1 mmHg AV Area (VTI):     1.90 cm AV Vmax:           217.50 cm/s AV Vmean:          147.000 cm/s AV VTI:            0.411 m AV Peak Grad:      18.9 mmHg AV Mean Grad:      10.0 mmHg LVOT Vmax:         161.00 cm/s LVOT Vmean:        109.000 cm/s LVOT VTI:          0.307 m LVOT/AV VTI ratio: 0.75 AI PHT:            526 msec  AORTA Ao Root diam: 3.70 cm Ao Asc diam:  3.90 cm MITRAL VALVE MV Area (PHT): 3.08 cm     SHUNTS MV Decel Time: 246 msec     Systemic VTI:  0.31 m MV E velocity: 118.00 cm/s  Systemic Diam: 1.80 cm MV A velocity: 126.00 cm/s MV E/A ratio:  0.94 Arvilla Meres MD Electronically signed by Arvilla Meres MD Signature Date/Time: 09/08/2022/4:37:49 PM    Final    MR LUMBAR SPINE WO CONTRAST  Result Date: 09/07/2022 CLINICAL DATA:  62 year old male with fever of unknown origin. Dialysis patient. Cough, diarrhea. Back pain. EXAM: MRI LUMBAR  SPINE WITHOUT CONTRAST TECHNIQUE: Multiplanar, multisequence MR imaging of the lumbar spine was performed. No intravenous contrast was administered. COMPARISON:  Thoracic MRI today reported separately, CT Chest, Abdomen, and Pelvis and CT lumbar spine 0339 hours today. FINDINGS: Segmentation:  Normal, concordant with the thoracic numbering today. Alignment: Stable from the CT earlier today. Maintained lumbar lordosis. No significant scoliosis or spondylolisthesis. Vertebrae: Background heterogeneous lumbosacral and pelvic marrow signal as seen in the thoracic MRI, compatible with renal osteodystrophy demonstrated by CT. Intermittent mild and degenerative appearing endplate marrow edema from T12 through the lumbosacral junction. Such marrow edema is most pronounced at L5-S1, and there is increased STIR signal in the disc there. But no paraspinal inflammation there. See additional details of that level below. Intact visible sacrum and SI joints. Conus medullaris and cauda equina: Conus extends to the T12-L1 level. No lower spinal cord or conus signal abnormality. Fairly capacious spinal canal throughout and normal cauda equina nerve roots. Paraspinal and other soft tissues: Stable to the CT Abdomen and Pelvis this morning. No convincing paraspinal soft tissue inflammation. Disc levels: Normal for age above L5-S1. L5-S1: Disc space loss and heterogeneity, some increased central disc STIR signal. No vacuum disc by CT this morning. Mild endplate irregularity by CT there. Circumferential disc osteophyte complex, broad-based posterior component affecting the ventral epidural fat, with mild to moderate lateral recess stenosis greater on the right. No spinal stenosis. Mild to moderate bilateral degenerative foraminal stenosis. IMPRESSION: 1. Background renal osteodystrophy. Intermittent mild degenerative appearing endplate marrow edema as seen in the thoracic spine today. However, more indeterminate changes at the L5-S1  level. Difficult to exclude Early Discitis Osteomyelitis there, although  no paraspinal inflammation at this time. Recommend blood cultures, and if negative a repeat Lumbar MRI (without and with contrast preferred, but noncontrast probably would suffice) in 7-14 days. 2. Disc and endplate degeneration at L5-S1 with up to moderate involvement of the exiting L5 and descending S1 nerves. No spinal stenosis. And age-appropriate lumbar spine degeneration otherwise. Electronically Signed   By: Odessa Fleming M.D.   On: 09/07/2022 08:47   MR THORACIC SPINE WO CONTRAST  Result Date: 09/07/2022 CLINICAL DATA:  62 year old male with fever of unknown origin. Dialysis patient. Cough, diarrhea. Back pain. EXAM: MRI THORACIC SPINE WITHOUT CONTRAST TECHNIQUE: Multiplanar, multisequence MR imaging of the thoracic spine was performed. No intravenous contrast was administered. COMPARISON:  CT Chest, Abdomen, and Pelvis 0339 hours today. FINDINGS: Limited cervical spine imaging: Some disc and endplate degeneration. Mildly heterogeneous bone marrow signal similar to that in the thoracic spine. Thoracic spine segmentation:  Appears to be normal. Alignment: Mildly exaggerated thoracic kyphosis. No spondylolisthesis or significant scoliosis. Vertebrae: Generalized heterogeneous marrow signal, likely corresponding to CT evidence of diffuse renal osteodystrophy earlier today. Mostly vague decreased T1 and T2 marrow signal. Superimposed degenerative appearing endplate changes in the thoracic spine T5 through T10 with multilevel mild focal endplate marrow edema there. No suspicious marrow edema or suspicious marrow lesion. Grossly negative visible posterior ribs. Cord: Negative. Capacious thoracic spinal canal. Conus medullaris appears normal at T12-L1. Paraspinal and other soft tissues: Stable from CT Chest, Abdomen, and Pelvis today. Negative thoracic paraspinal soft tissues. Disc levels: Widespread thoracic disc and endplate degeneration, but  no significant thoracic disc herniation. No thoracic spinal stenosis. And only occasional thoracic neural foraminal stenosis (T10-T11 mild-to-moderate T10 foraminal stenosis in part due to facet hypertrophy. IMPRESSION: Background marrow signal changes likely relating to diffuse renal osteodystrophy seen by CT today. Superimposed thoracic disc and endplate degeneration, including multilevel mild degenerative appearing endplate marrow edema in the thoracic spine. But no findings suspicious for discitis osteomyelitis at this time. No thoracic spinal stenosis. Electronically Signed   By: Odessa Fleming M.D.   On: 09/07/2022 08:39   CT L-SPINE NO CHARGE  Result Date: 09/07/2022 CLINICAL DATA:  Fever unknown origin.  Back pain. EXAM: CT LUMBAR SPINE WITHOUT CONTRAST TECHNIQUE: Multidetector CT imaging of the lumbar spine was performed without intravenous contrast administration. Multiplanar CT image reconstructions were also generated. RADIATION DOSE REDUCTION: This exam was performed according to the departmental dose-optimization program which includes automated exposure control, adjustment of the mA and/or kV according to patient size and/or use of iterative reconstruction technique. COMPARISON:  CT abdomen and pelvis with IV contrast 03/09/2019 FINDINGS: Segmentation: 5 lumbar type vertebrae. Alignment: Within normal limits. Vertebrae: There have been no significant interval changes. The bones are again noted dense consistent with renal osteodystrophy. There are prominent lucencies in the superior and inferior endplates of L2 which were noted previously and could be large Schmorl's nodes or changes of hyperparathyroidism. There are additional slight central endplate depressions L4 and 5 which were noted previously, with discogenic endplate irregularity opposite of L5-S1. No fracture or other acute abnormality is seen. No destructive or aggressive bone lesion. Paraspinal and other soft tissues: Aortoiliac  atherosclerosis. Atrophic native kidneys. No acute findings. Disc levels: There are normal disc heights except for moderate disc space loss once again T11-12 and T12-L1. There are no herniated discs or significant disc bulges from T12-L1 through L2-3. At L3-4, there is mild diffuse nonstenosing annular bulge which was seen previously. There is mild foraminal narrowing due to  inferior foraminal disc bulging. No herniation or significant canal encroachment. At L4-5, there is a mild diffuse annular bulge without herniation or canal zone stenosis. The foramina are clear. There is only slight facet spurring. At L5-S1, there is a broad-based posterior disc bulge abutting both S1 nerve roots without displacement or compression, herniation or stenosis. Due to inferior foraminal disc bulging and facet spurring there is moderate bilateral foraminal stenosis. The SI joints are patent with mild spurring. No erosive arthropathy is seen. IMPRESSION: 1. No acute osseous abnormalities.  No appreciable interval changes. 2. Lumbar degenerative changes and findings of renal osteodystrophy. 3. Prominent lucencies in the superior and inferior endplates of L2 which could be large Schmorl's nodes or changes of hyperparathyroidism. Similar findings in 2020. 4. Aortic atherosclerosis. 5. Atrophic native kidneys. Aortic Atherosclerosis (ICD10-I70.0). Electronically Signed   By: Almira Bar M.D.   On: 09/07/2022 04:47   CT CHEST ABDOMEN PELVIS WO CONTRAST  Result Date: 09/07/2022 CLINICAL DATA:  Fever of unknown origin. EXAM: CT CHEST, ABDOMEN AND PELVIS WITHOUT CONTRAST TECHNIQUE: Multidetector CT imaging of the chest, abdomen and pelvis was performed following the standard protocol without IV contrast. RADIATION DOSE REDUCTION: This exam was performed according to the departmental dose-optimization program which includes automated exposure control, adjustment of the mA and/or kV according to patient size and/or use of iterative  reconstruction technique. COMPARISON:  Portable chest today, PA Lat chest 12/29/2017. Chest CT with contrast 12/25/2017, abdomen and pelvis CT with contrast 03/09/2019, abdomen and pelvis CT no contrast 07/07/2017. FINDINGS: CT CHEST FINDINGS Cardiovascular: The cardiac size is normal, was previously enlarged on all prior CTs. The pulmonary trunk remains prominent at 3.3 cm indicating arterial hypertension. No venous distention is seen. There is a dialysis catheter via right IJ approach, the tip in the upper right atrium. There are moderate calcifications of the aortic valve leaflets, mild-to-moderate calcifications in the thoracic aorta without aneurysm. Scattered calcific plaque in the great vessels with normal great vessel branching. Mediastinum/Nodes: Numerous tiny lymph nodes are scattered throughout the mediastinum. These are less prominent than in 2019. Largest is 1.1 cm in short axis in the left prevascular space and similar to the prior study. Others are not enlarged and most of them are less than 5 mm. There is no esophageal thickening, tracheal or main bronchus filling defect. Thyroid gland and axillary spaces are unremarkable. Lungs/Pleura: Lung bases show scattered subsegmental level. There is new demonstration of a posterior basal left lower lobe 6 mm noncalcified nodule on 6:110. There is new demonstration of a 3 mm noncalcified right upper lobe nodule on 6:47. The remainder of the bilateral lungs are clear. No focal pneumonia is evident. Musculoskeletal: Bilateral subareolar gynecomastia appears similar. There are multilevel endplate Schmorl's nodes. Mild thoracic kyphosis. There is thoracic spondylosis. No aggressive osseous lesion. CT ABDOMEN PELVIS FINDINGS Hepatobiliary: The liver is 18.6 cm length and mildly steatotic. No mass is seen without contrast. The gallbladder and bile ducts are unremarkable. Pancreas: Unremarkable without contrast. Spleen: Unremarkable without contrast. Adrenals/Urinary  Tract: There is no adrenal mass. The native kidneys are atrophic. There are multiple small cysts and additional too small to characterize subcentimeter hypodensities. No specific follow-up recommendation is made. There are no intrarenal stones in the native kidneys no hydronephrosis. 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 Findings consistent with chronic transplant rejection. Ureteral stent again extends from this transplant into the bladder. There is diffuse irregular  bladder thickening which was seen on prior studies as well with perivesical stranding. Correlate clinically for chronic cystitis. Stomach/Bowel: The stomach, duodenum and proximal jejunum are unremarkable but there are dilated segments of distal jejunum and ileum with operative at adjuvant up to 3 cm, or decompression of the remainder. A transitional segment could not be found. Findings are concerning for a low-grade approximately mid ileal small-bowel obstruction potential due to occult adhesions or occult internal hernia. The appendix is normal caliber. There is diffuse colonic diverticulosis without evidence of diverticulitis. Vascular/Lymphatic: Aortic atherosclerosis. No enlarged abdominal or pelvic lymph nodes. Reproductive: Borderline prostate size 4.4 cm transverse. Both testicles are in the scrotal sac. Other: Small umbilical and left inguinal fat hernias. No incarcerated hernia. There is no free fluid, free hemorrhage, or free air. Musculoskeletal: Dense bones consistent with renal osteodystrophy. Lucencies in the L2 vertebral body are stable since 2020 consistent with prominent Schmorl's nodes or changes of hyperparathyroidism. There is no aggressive bone lesion regional skeletal fracture. IMPRESSION: 1. No focal pneumonia is seen. 2. 6 mm left lower lobe and 3 mm right upper lobe noncalcified nodules. Per Fleischner Society  Guidelines, recommend a non-contrast Chest CT at 3-6 months, then consider another non-contrast Chest CT at 18-24 months. If patient is low risk for malignancy, non-contrast Chest CT at 18-24 months is optional. These guidelines do not apply to immunocompromised patients and patients with cancer. Follow up in patients with significant comorbidities as clinically warranted. For lung cancer screening, adhere to Lung-RADS guidelines. Reference: Radiology. 2017; 284(1):228-43. 3. Aortic and coronary artery atherosclerosis. 4. Aortic valve leaflet calcifications. Echocardiography may be helpful to assess for valvular dysfunction. 5. Chronic prominence of the pulmonary trunk indicating arterial hypertension. 6. Numerous tiny lymph nodes in the mediastinum, less prominent than in 2019. 7. Atrophic native kidneys with increasingly calcified and atrophic right iliac fossa renal transplant. Ureteral stent again extends from this transplant into the bladder. 8. Mildly dilated segments of distal jejunum and ileum with decompression of the remainder of the small bowel. Findings concerning for a low-grade approximately mid ileal small-bowel obstruction potentially due to occult adhesions or occult internal hernia. 9. Diffuse colonic diverticulosis without evidence of diverticulitis. 10. Likely cystitis. No other findings to explain the patient's fever. 11. Renal osteodystrophy. 12. Umbilical and left inguinal fat hernias. Aortic Atherosclerosis (ICD10-I70.0). Electronically Signed   By: Almira Bar M.D.   On: 09/07/2022 04:33   DG Chest Portable 1 View  Result Date: 09/07/2022 CLINICAL DATA:  Cough and fevers EXAM: PORTABLE CHEST 1 VIEW COMPARISON:  12/29/2017 FINDINGS: Cardiac shadow is enlarged but stable. Dialysis catheter is noted in satisfactory position. Lungs are well aerated bilaterally. No focal infiltrate or effusion is seen. No bony abnormality is noted. IMPRESSION: No acute abnormality seen. Electronically  Signed   By: Alcide Clever M.D.   On: 09/07/2022 02:42   (Echo, Carotid, EGD, Colonoscopy, ERCP)    Subjective: Patient seen and examined.  Denies any complaints.  He does have episodic back pain and spasm and relieved with oxycodone.  Looking forward to go to rehab.   Discharge Exam: Vitals:   09/25/22 0425 09/25/22 0725  BP: 124/74 132/72  Pulse: 79 76  Resp: 20 15  Temp: 98.1 F (36.7 C) 98.1 F (36.7 C)  SpO2: 98% 100%   Vitals:   09/24/22 2017 09/25/22 0019 09/25/22 0425 09/25/22 0725  BP: 116/75 126/65 124/74 132/72  Pulse: 83 80 79 76  Resp: 20 18 20 15   Temp: 98.2 F (  36.8 C) 98.1 F (36.7 C) 98.1 F (36.7 C) 98.1 F (36.7 C)  TempSrc: Oral Oral Oral Oral  SpO2: 94% 100% 98% 100%  Weight:   81 kg   Height:        General: Pt is alert, awake, not in acute distress, on minimal oxygen. Cardiovascular: RRR, S1/S2 +, no rubs, no gallops, left IJ permacath present. Respiratory: CTA bilaterally, no wheezing, no rhonchi Abdominal: Soft, NT, ND, bowel sounds + Extremities: no edema, no cyanosis    The results of significant diagnostics from this hospitalization (including imaging, microbiology, ancillary and laboratory) are listed below for reference.     Microbiology: No results found for this or any previous visit (from the past 240 hour(s)).   Labs: BNP (last 3 results) Recent Labs    09/07/22 0215  BNP 433.4*   Basic Metabolic Panel: Recent Labs  Lab 09/21/22 0513 09/21/22 1037 09/22/22 0416 09/23/22 0105 09/24/22 0022  NA 127* 126* 124* 130* 128*  K 5.3* 5.4* 6.0* 4.3 4.2  CL 89* 86* 87* 92* 90*  CO2 22 20* 20* 20* 26  GLUCOSE 104* 134* 129* 111* 133*  BUN 104* 109* 119* 55* 38*  CREATININE 13.26* 13.01* 14.43* 7.70* 5.05*  CALCIUM 7.9* 8.0* 7.7* 7.7* 8.3*  PHOS  --  9.2*  --  7.5*  --    Liver Function Tests: Recent Labs  Lab 09/19/22 0424 09/20/22 0250 09/21/22 0513 09/21/22 1037 09/22/22 0416 09/23/22 0105 09/24/22 0022  AST 41  52* 61*  --  59*  --  75*  ALT 6 6 6   --  7  --  10  ALKPHOS 104 93 97  --  118  --  105  BILITOT 0.5 0.6 0.6  --  0.8  --  0.6  PROT 7.0 6.7 7.1  --  7.6  --  7.8  ALBUMIN 1.6* 1.5* 1.7* 1.7* 1.8* 1.7* 2.2*   No results for input(s): "LIPASE", "AMYLASE" in the last 168 hours. No results for input(s): "AMMONIA" in the last 168 hours. CBC: Recent Labs  Lab 09/20/22 0250 09/21/22 0513 09/22/22 0416 09/23/22 0105 09/24/22 0022  WBC 22.5* 24.6* 25.1* 17.5* 13.9*  HGB 7.8* 8.2* 7.7* 7.6* 8.0*  HCT 23.2* 23.6* 23.5* 22.6* 24.8*  MCV 82.3 81.7 80.5 79.0* 80.8  PLT 292 313 312 285 315   Cardiac Enzymes: No results for input(s): "CKTOTAL", "CKMB", "CKMBINDEX", "TROPONINI" in the last 168 hours. BNP: Invalid input(s): "POCBNP" CBG: No results for input(s): "GLUCAP" in the last 168 hours. D-Dimer No results for input(s): "DDIMER" in the last 72 hours. Hgb A1c No results for input(s): "HGBA1C" in the last 72 hours. Lipid Profile No results for input(s): "CHOL", "HDL", "LDLCALC", "TRIG", "CHOLHDL", "LDLDIRECT" in the last 72 hours. Thyroid function studies No results for input(s): "TSH", "T4TOTAL", "T3FREE", "THYROIDAB" in the last 72 hours.  Invalid input(s): "FREET3" Anemia work up No results for input(s): "VITAMINB12", "FOLATE", "FERRITIN", "TIBC", "IRON", "RETICCTPCT" in the last 72 hours. Urinalysis    Component Value Date/Time   COLORURINE RED (A) 07/12/2017 0940   APPEARANCEUR CLOUDY (A) 07/12/2017 0940   LABSPEC  07/12/2017 0940    TEST NOT REPORTED DUE TO COLOR INTERFERENCE OF URINE PIGMENT   PHURINE  07/12/2017 0940    TEST NOT REPORTED DUE TO COLOR INTERFERENCE OF URINE PIGMENT   GLUCOSEU (A) 07/12/2017 0940    TEST NOT REPORTED DUE TO COLOR INTERFERENCE OF URINE PIGMENT   HGBUR (A) 07/12/2017 0940    TEST  NOT REPORTED DUE TO COLOR INTERFERENCE OF URINE PIGMENT   BILIRUBINUR (A) 07/12/2017 0940    TEST NOT REPORTED DUE TO COLOR INTERFERENCE OF URINE PIGMENT    KETONESUR (A) 07/12/2017 0940    TEST NOT REPORTED DUE TO COLOR INTERFERENCE OF URINE PIGMENT   PROTEINUR (A) 07/12/2017 0940    TEST NOT REPORTED DUE TO COLOR INTERFERENCE OF URINE PIGMENT   NITRITE (A) 07/12/2017 0940    TEST NOT REPORTED DUE TO COLOR INTERFERENCE OF URINE PIGMENT   LEUKOCYTESUR (A) 07/12/2017 0940    TEST NOT REPORTED DUE TO COLOR INTERFERENCE OF URINE PIGMENT   Sepsis Labs Recent Labs  Lab 09/21/22 0513 09/22/22 0416 09/23/22 0105 09/24/22 0022  WBC 24.6* 25.1* 17.5* 13.9*   Microbiology No results found for this or any previous visit (from the past 240 hour(s)).   Time coordinating discharge: 35 minutes  SIGNED:   Dorcas Carrow, MD  [facility] 09/25/2022, 9:05 AM

## 2022-09-25 NOTE — Progress Notes (Signed)
   09/25/22 1411  Vitals  Temp 97.8 F (36.6 C)  Pulse Rate 78  Resp 18  BP 100/65  SpO2 99 %  O2 Device Nasal Cannula  Weight 77.6 kg  Type of Weight Post-Dialysis  Oxygen Therapy  O2 Flow Rate (L/min) 4 L/min  Pulse Oximetry Type Continuous  Oximetry Probe Site Changed No  Patient Activity (if Appropriate) In bed  Post Treatment  Dialyzer Clearance Lightly streaked  Duration of HD Treatment -hour(s) 3.75 hour(s)  Hemodialysis Intake (mL) 0 mL  Liters Processed 94  Fluid Removed (mL) 3500 mL  Tolerated HD Treatment Yes   Received patient in bed to unit.  Alert and oriented.  Informed consent signed and in chart.   TX duration:  Patient tolerated well.  Transported back to the room  Alert, without acute distress.  Hand-off given to patient's nurse.   Access used: [facility] Access issues: no complications  Total UF removed: 3500 Medication(s) given: hectoral iv x 1    [name].  Alert and oriented.  Informed consent signed and in chart.   TX 3.75  Patient tolerated well.  Transported back to the room  Alert, without acute distress.  Hand-off given to patient's nurse.   Access used: [facility] Access issues: no complications  Total UF removed: 3500 Medication(s) given: hectoral iv x 1 [name]

## 2022-09-25 NOTE — Progress Notes (Signed)
Pt has PRN Bipap orders, no distress noted at this time.  

## 2022-09-25 NOTE — Progress Notes (Signed)
[facility] Progress Note   Subjective:   Seen on HD - 3.5L UFG and tolerating. Has been accepted to transfer to CIR today. No CP/dyspnea (on oxygen).  Objective Vitals:   09/25/22 0425 09/25/22 0725 09/25/22 0949 09/25/22 1000  BP: 124/74 132/72 107/70 100/70  Pulse: 79 76 77 74  Resp: 20 15 19 15   Temp: 98.1 F (36.7 C) 98.1 F (36.7 C) (!) 97.3 F (36.3 C)   TempSrc: Oral Oral    SpO2: 98% 100% 100% 100%  Weight: 81 kg  81.3 kg   Height:       Physical Exam General: Well appearing man, NAD. [place] O2 in place. Heart: RRR; no murmur Lungs: Bibasilar rales Abdomen: soft Extremities: no LE edema Dialysis Access:  none  Additional Objective Labs: Basic Metabolic Panel: Recent Labs  Lab 09/21/22 1037 09/22/22 0416 09/23/22 0105 09/24/22 0022  NA 126* 124* 130* 128*  K 5.4* 6.0* 4.3 4.2  CL 86* 87* 92* 90*  CO2 20* 20* 20* 26  GLUCOSE 134* 129* 111* 133*  BUN 109* 119* 55* 38*  CREATININE 13.01* 14.43* 7.70* 5.05*  CALCIUM 8.0* 7.7* 7.7* 8.3*  PHOS 9.2*  --  7.5*  --    Liver Function Tests: Recent Labs  Lab 09/21/22 0513 09/21/22 1037 09/22/22 0416 09/23/22 0105 09/24/22 0022  AST 61*  --  59*  --  75*  ALT 6  --  7  --  10  ALKPHOS 97  --  118  --  105  BILITOT 0.6  --  0.8  --  0.6  PROT 7.1  --  7.6  --  7.8  ALBUMIN 1.7*   < > 1.8* 1.7* 2.2*   < > = values in this interval not displayed.   CBC: Recent Labs  Lab 09/20/22 0250 09/21/22 0513 09/22/22 0416 09/23/22 0105 09/24/22 0022  WBC 22.5* 24.6* 25.1* 17.5* 13.9*  HGB 7.8* 8.2* 7.7* 7.6* 8.0*  HCT 23.2* 23.6* 23.5* 22.6* 24.8*  MCV 82.3 81.7 80.5 79.0* 80.8  PLT 292 313 312 285 315   Medications:   ceFAZolin (ANCEF) IV 2 g (09/20/22 1902)   methocarbamol (ROBAXIN) IV 110 mL/hr at 09/20/22 1600    atenolol  12.5 mg Oral Daily   calcitRIOL  0.5 mcg Oral Q T,Th,Sat-1800   Chlorhexidine Gluconate Cloth  6 each Topical Q0600   cinacalcet  30 mg Oral Q supper   darbepoetin  (ARANESP) injection - DIALYSIS  100 mcg Subcutaneous Q Thu-1800   feeding supplement (NEPRO CARB STEADY)  237 mL Oral BID BM   heparin  4,000 Units Dialysis Once in dialysis   multivitamin  1 tablet Oral QHS   pantoprazole  40 mg Oral Daily   sodium chloride flush  3 mL Intravenous Q12H   sucroferric oxyhydroxide  1,000 mg Oral TID WC    Dialysis Orders: [facility]  3:45 450/A1.5x 2K/2Ca EDW 85.7kg TDC  -Heparin 3000 units IV TIW  -No ESA -Calcitriol 1.5 MCG PO TIW   Assessment/Plan: 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 ID following. Blood Cx 6/16 MSSA, negative Cx on 6/17, 6/19. TDC removed 6/18. Completed 72 hour line holiday. TTE concerning for endocarditis. TEE cancelled d/t high risk factors. LS MRI with possible L5-S1 discitis. Temp cath placed 6/21, but poor function requiring exchange - see below. Will be getting IV Cefazolin 2g q HD x 6 weeks (until 11/04/22).  Dialysis Access: [facility] removed 6/18 - s/p line holiday, then  temp line placed 6/21 with recurrent non-function issues despite exchange 6/26. S/p [facility] 7/1 with IR.  SBO - on CT Ab 6/19. Now resolved, surgery team has signed off. Hyperkalemia: Improved with HD. ESRD: Usual TTS schedule - then line issues preventing HD 6/27 -7/1. Now resolved and HD 7/1, 7/2 to correct. HD today, 3.5L UFG - now back on usual schedule. Perm Access: S/p L AVF ligation in 2019. Last seen by Dr. Edilia Bo 01/2022 - limited mobility in R arm so planning for L arm graft but had not scheduled surgery yet.   Hypertension/volume: BP better, still with volume on exam, UF as tolerated. Will need lower EDW. Anemia of ESRD: Hgb 8 - continue Aranesp q Thursday, have increased next dose. Metabolic bone disease: CorrCa ok, Phos high. Previously on Renvela as binder - stopped due to SBO, changed to Velphoro - have ^d dose to 2/meals.  Nutrition - Renal diet, Alb low - continue supps. Dispo - looks like transferring to CIR  today.  Ozzie Hoyle, PA-C 09/25/2022, 10:15 AM  [employer]

## 2022-09-25 NOTE — PMR Pre-admission (Signed)
PMR Admission Coordinator Pre-Admission Assessment  Patient: Ryan [name] is an 62 y.o., male MRN: [id number] DOB: 08-14-1960 Height: 5\' 9"  (175.3 cm) Weight: 81.3 kg              Insurance Information HMO: yes    PPO:      PCP:      IPA:      80/20:      OTHER:  PRIMARY: [employer]      Policy#: [id number]      Subscriber: pt CM Name: Earlyne Iba      Phone#: not provided     Fax#: [phone] Pre-Cert#: [id number] auth for CIR via fax from [facility] for admit 7/3 with updates due to fax listed above on 7/10      Employer:  Benefits:  Phone #: [phone]     Name:  Eff. Date: 08/23/22     Deduct: $0      Out of Pocket Max: $$3600 (met)      Life Max: n/a  CIR: $295/day for days 1-6      SNF: 20 full days Outpatient:      Co-Pay: $15/visit Home Health: 100%      Co-Pay:  DME: 80%     Co-Pay: 20% Providers:  SECONDARY:       Policy#:       Phone#:   Artist:       Phone#:   The "Data Collection Information Summary" for patients in Inpatient Rehabilitation Facilities with attached "Privacy Act Statement-Health Care Records" was provided and verbally reviewed with: Patient and Family  Emergency Contact Information Contact Information     Name Relation Home Work Mobile   Dabney,Brittany Daughter   [phone]   Anastasios, Mccloskey   [phone]   Thompson,Lanise Niece   [phone]   Deirdre Pippins Daughter   [phone]      Current Medical History  Patient Admitting Diagnosis: discitis, bacteremia, endocarditis, septic emboli to brain/lung, cavitary PNA   History of Present Illness: Pt is a 62 y/o male with PMH of HTN, HLD, and ESRD (HD TRS), who admitted to [facility] on 09/07/22 with c/o back pain.  In ED tmax 102.5, SBP 161, WBC 23.9, Hgb 11.1, BUN 43, Creatinine 8.42 and he was admitted for sepsis criteria and started on broad spectrum abx.  Chest xray negative, CT showed multiple pulmonary nodules as well as some dilated loops of jejunum and  ileum concerning for possible obstruction.  MRI T/L spine ordered and showed indeterminate changes at L5-S1 suggestive of early discitis/osteomyelitis.  Blood cultures MSSA bacteremia.  ID consulted and felt bacteremia likely due to line infection and recommended TDC exchange, IV abx, and recurrent blood culture surveillance until negative.  Echo performed and showed probably vegetation.  TTE confirmed mitral valve endocarditis.  Pt with ongoing confusion and CT scan chest/brain showed septic emboli to both locations, as well as new airspace oacity in LUL consistent with cavitary PNA.  Pt unable to complete line exchange due to elevated WBC and became uremic.  TDC replaced on 7/1 and pt received additional HD for clearance.  He will be on IV cefazolin with HD treatments through 8/13.  Hospital course complicated by SBO, which has resolved.  Therapy ongoing and pt was recommended for CIR.   Complete NIHSS TOTAL: 3 Glasgow Coma Scale Score: 15  Patient's medical record from [facility] has been reviewed by the rehabilitation admission coordinator and physician.  Past Medical History  Past Medical History:  Diagnosis Date  Allergy    Anemia    Blood transfusion without reported diagnosis    Dialysis patient (HCC)    Tues,thurs,sat   ESRD (end stage renal disease) (HCC)    TTHS-    Family history of adverse reaction to anesthesia    father had allergic reaction with anesthesia with a dental procedure-(pt. doesn't know)   Hyperlipidemia    Hypertension     Has the patient had major surgery during 100 days prior to admission? No  Family History  family history includes Alcohol abuse in his maternal grandfather; Cancer in his father; Heart disease in his maternal grandmother; Heart disease (age of onset: 58) in his mother; Hypertension in his mother and sister; Kidney cancer in his father; Learning disabilities in his paternal grandmother; Mental illness in his paternal grandmother; Stroke in his  mother and paternal grandfather.   Current Medications   Current Facility-Administered Medications:    acetaminophen (TYLENOL) tablet 650 mg, 650 mg, Oral, Q6H PRN, 650 mg at 09/24/22 1312 **OR** acetaminophen (TYLENOL) suppository 650 mg, 650 mg, Rectal, Q6H PRN, Opyd, Lavone Neri, MD   atenolol (TENORMIN) tablet 12.5 mg, 12.5 mg, Oral, Daily, Jerald Kief, MD, 12.5 mg at 09/24/22 4098   calcitRIOL (ROCALTROL) capsule 0.5 mcg, 0.5 mcg, Oral, Q T,Th,Sat-1800, Ejigiri, Ogechi Grace, PA-C, 0.5 mcg at 09/20/22 1745   camphor-menthol (SARNA) lotion, , Topical, PRN, Jerald Kief, MD, Given at 09/22/22 1207   ceFAZolin (ANCEF) IVPB 2g/100 mL premix, 2 g, Intravenous, Q [name], [facility], Last Rate: 200 mL/hr at 09/20/22 1902, 2 g at 09/20/22 1902   Chlorhexidine Gluconate Cloth 2 % PADS 6 each, 6 each, Topical, Q0600, Pola Corn, NP, 6 each at 09/24/22 1123   chlorproMAZINE (THORAZINE) tablet 25 mg, 25 mg, Oral, TID PRN, Jerald Kief, MD, 25 mg at 09/19/22 1525   cinacalcet (SENSIPAR) tablet 30 mg, 30 mg, Oral, Q supper, Jonah Blue, MD, 30 mg at 09/24/22 1751   Darbepoetin Alfa (ARANESP) injection 100 mcg, 100 mcg, Subcutaneous, Q Thu-1800, Stovall, Kathryn R, PA-C   feeding supplement (NEPRO CARB STEADY) liquid 237 mL, 237 mL, Oral, BID BM, Narda Bonds, MD, 237 mL at 09/24/22 1458   guaiFENesin (ROBITUSSIN) 100 MG/5ML liquid 5 mL, 5 mL, Oral, Q4H PRN, Jonah Blue, MD, 5 mL at 09/19/22 1129   heparin injection 4,000 Units, 4,000 Units, Dialysis, Once in dialysis, Stovall, Kathryn R, PA-C   HYDROmorphone (DILAUDID) injection 0.5-1 mg, 0.5-1 mg, Intravenous, Q3H PRN, Narda Bonds, MD, 1 mg at 09/22/22 0544   lip balm (CARMEX) ointment, , Topical, PRN, Marinda Elk, MD   melatonin tablet 3 mg, 3 mg, Oral, QHS PRN, Opyd, Lavone Neri, MD, 3 mg at 09/24/22 0207   methocarbamol (ROBAXIN) 500 mg in dextrose 5 % 50 mL IVPB, 500 mg, Intravenous, Q6H  PRN, Jonah Blue, MD, Last Rate: 110 mL/hr at 09/20/22 1600, Infusion Verify at 09/20/22 1600   multivitamin (RENA-VIT) tablet 1 tablet, 1 tablet, Oral, QHS, Jonah Blue, MD, 1 tablet at 09/24/22 2226   ondansetron (ZOFRAN) tablet 4 mg, 4 mg, Oral, Q6H PRN, 4 mg at 09/15/22 1158 **OR** ondansetron (ZOFRAN) injection 4 mg, 4 mg, Intravenous, Q6H PRN, Opyd, Lavone Neri, MD   Oral care mouth rinse, 15 mL, Mouth Rinse, PRN, Jerald Kief, MD   oxyCODONE (Oxy IR/ROXICODONE) immediate release tablet 5-10 mg, 5-10 mg, Oral, Q4H PRN, Narda Bonds, MD, 10 mg at 09/24/22 1800   pantoprazole (PROTONIX)  EC tablet 40 mg, 40 mg, Oral, Daily, Bufford Buttner, MD, 40 mg at 09/24/22 0829   phenol (CHLORASEPTIC) mouth spray 1 spray, 1 spray, Mouth/Throat, PRN, Jerald Kief, MD, 1 spray at 09/20/22 2117   senna (SENOKOT) tablet 8.6 mg, 1 tablet, Oral, Daily PRN, Opyd, Lavone Neri, MD, 8.6 mg at 09/20/22 1354   sodium chloride flush (NS) 0.9 % injection 3 mL, 3 mL, Intravenous, Q12H, Opyd, Timothy S, MD, 3 mL at 09/24/22 2227   sucroferric oxyhydroxide (VELPHORO) chewable tablet 1,000 mg, 1,000 mg, Oral, TID WC, Julien Nordmann, PA-C, 1,000 mg at 09/24/22 1750  Patients Current Diet:  Diet Order             Diet - low sodium heart healthy           Diet regular Room service appropriate? Yes; Fluid consistency: Thin; Fluid restriction: 1200 mL Fluid  Diet effective now                   Precautions / Restrictions Precautions Precautions: Fall Restrictions Weight Bearing Restrictions: No   Has the patient had 2 or more falls or a fall with injury in the past year?No  Prior Activity Level Community (5-7x/wk): indep without DME, driving, went out daily to get out of the house  Prior Functional Level Prior Function Prior Level of Function : Independent/Modified Independent, Driving Mobility Comments: Previously Independent without an AD ADLs Comments: Previously Independent to Mod I  with pt stating he does not wear socks due to difficulty donning/doffing them and socks feet instead of washing secondary to pain in leaning forward to wash feet.  Self Care: Did the patient need help bathing, dressing, using the toilet or eating?  Independent  Indoor Mobility: Did the patient need assistance with walking from room to room (with or without device)? Independent  Stairs: Did the patient need assistance with internal or external stairs (with or without device)? Independent  Functional Cognition: Did the patient need help planning regular tasks such as shopping or remembering to take medications? Independent  Patient Information Are you of Hispanic, Latino/a,or Spanish origin?: A. No, not of Hispanic, Latino/a, or Spanish origin What is your race?: B. Black or African American Do you need or want an interpreter to communicate with a doctor or health care staff?: 0. No  Patient's Response To:  Health Literacy and Transportation Is the patient able to respond to health literacy and transportation needs?: Yes Health Literacy - How often do you need to have someone help you when you read instructions, pamphlets, or other written material from your doctor or pharmacy?: Never In the past 12 months, has lack of transportation kept you from medical appointments or from getting medications?: No In the past 12 months, has lack of transportation kept you from meetings, work, or from getting things needed for daily living?: No  Journalist, newspaper / Equipment Home Assistive Devices/Equipment: None Home Equipment: None  Prior Device Use: Indicate devices/aids used by the patient prior to current illness, exacerbation or injury? None of the above  Current Functional Level Cognition  Arousal/Alertness: Awake/alert Overall Cognitive Status: Within Functional Limits for tasks assessed Difficult to assess due to: Impaired communication (word and thought difficulties) Current Attention  Level: Sustained Orientation Level: Oriented X4 Following Commands: Follows one step commands with increased time Safety/Judgement: Decreased awareness of deficits, Decreased awareness of safety General Comments: Pt with increased difficulty with word finding and difficulty answering questions requiring increased processing (including How  many quarters are there in $2.75?). This is new for pt. Pt previously worked in Education officer, environmental and in previous OT sesisons this admission demonstrated cognition WNL including ability to quickly process tasks/questions requiring higher level cognition. Attention:  (intermittently distracted internally but able to redirect independently) Memory: Appears intact Awareness: Appears intact Problem Solving: Appears intact Executive Function: Reasoning Reasoning: Impaired Reasoning Impairment: Functional complex Safety/Judgment: Appears intact    Extremity Assessment (includes Sensation/Coordination)  Upper Extremity Assessment: Overall WFL for tasks assessed RUE Deficits / Details: Pt reports hx of R rotator cuff tear with subsequent weakness and decreased AROM/PROM in Right shoulder. Strength 3+ tp 4-/5 RUE Sensation: WNL RUE Coordination: WNL LUE Deficits / Details: Pt presents with general L UE MMT 3+ to 4-/5 and AROM shoulder flexion to approx. 105 degrees. LUE Sensation: WNL LUE Coordination: WNL  Lower Extremity Assessment: Defer to PT evaluation    ADLs  Overall ADL's : Needs assistance/impaired Eating/Feeding: Modified independent, Bed level Grooming: Modified independent, Bed level, Cueing for sequencing Upper Body Bathing: Moderate assistance, Sitting, Cueing for compensatory techniques, Cueing for sequencing Lower Body Bathing: Maximal assistance, Sit to/from stand, Cueing for compensatory techniques, Cueing for sequencing Upper Body Dressing : Minimal assistance, Cueing for compensatory techniques, Sitting, Cueing for sequencing Lower Body Dressing:  Maximal assistance, Sit to/from stand, Cueing for sequencing, Cueing for compensatory techniques Lower Body Dressing Details (indicate cue type and reason): Pt able to donn socks while laying in bed with HOB up and crossing his legs in a "figure 4" pattern to get them on --it was  stretch for him but he was able to do it. Pt able to donn pj pants sitting in recliner with min-mod A sit<>stand Toilet Transfer: Moderate assistance, Cueing for sequencing, Cueing for safety, Stand-pivot, BSC/3in1, Rolling walker (2 wheels) Toilet Transfer Details (indicate cue type and reason): Mod A sit<>stand with min A to take 3 steps to turn to recliner with RW. Toileting- Clothing Manipulation and Hygiene: Moderate assistance, Cueing for sequencing, Cueing for compensatory techniques, Sitting/lateral lean, Sit to/from stand Toileting - Clothing Manipulation Details (indicate cue type and reason): Mod A sit<>stand General ADL Comments: OT educated pt in use of AE for increased safety and independence with ADLs and to assist in preventing pain exacerbation with bending and twisting. Pt celined sitting EOB this session secondary to pain and fatigue. Pt demonstrated understanding of AE in simulated tasks from bed level and/or teach back with pt stating he is agreeable to plan of trialling AE sitting EOB next OT session. OT also answered pt's general questions regarding available DME/AE for increased safety and independence in the bath/shower post acute discharge.    Mobility  Overal bed mobility: Needs Assistance Bed Mobility: Rolling, Sidelying to Sit Rolling: Min guard Sidelying to sit: Min guard Sit to supine: Mod assist General bed mobility comments: Pt beginning and ending session in recliner    Transfers  Overall transfer level: Needs assistance Equipment used: Rolling walker (2 wheels) Transfers: Sit to/from Stand Sit to Stand: Min guard Bed to/from chair/wheelchair/BSC transfer type:: Step pivot Step pivot  transfers: Min assist General transfer comment: From recliner with min guard for safety and cues for hand placement    Ambulation / Gait / Stairs / Wheelchair Mobility  Ambulation/Gait Ambulation/Gait assistance: Land (Feet): 65 Feet Assistive device: Rolling walker (2 wheels) Gait Pattern/deviations: Step-through pattern, Trunk flexed General Gait Details: Pt demonstrating a slow step-through pattern with cues for upright posture. 2 standing rest breaks due to  fatigue. Gait velocity: reduced Gait velocity interpretation: <1.31 ft/sec, indicative of household ambulator    Posture / Balance Dynamic Sitting Balance Sitting balance - Comments: sitting in recliner Balance Overall balance assessment: Needs assistance Sitting-balance support: No upper extremity supported Sitting balance-Leahy Scale: Fair Sitting balance - Comments: sitting in recliner Standing balance support: Bilateral upper extremity supported, Reliant on assistive device for balance, During functional activity Standing balance-Leahy Scale: Fair Standing balance comment: with RW support. Pt able to static stand without UE with no LOB    Special needs/care consideration Continuous Drip IV  cefazolin, Dialysis: Hemodialysis Tuesday, Thursday, and Saturday, and Diabetic management yes     Previous Home Environment (from acute therapy documentation) Living Arrangements: Alone  Lives With: Alone Available Help at Discharge: Family, Available 24 hours/day Type of Home: House Home Layout: Two level, 1/2 bath on main level Alternate Level Stairs-Number of Steps: flight Home Access: Stairs to enter Secretary/administrator of Steps: 1 Bathroom Shower/Tub: Psychologist, counselling Home Care Services: No Additional Comments: has already made plans to stay on the main level until he makes a full recovery  Discharge Living Setting Plans for Discharge Living Setting: Patient's home (family to come and stay at discharge  until safe to be indep again) Type of Home at Discharge: House Discharge Home Layout: Two level, Bed/bath upstairs, 1/2 bath on main level Alternate Level Stairs-Number of Steps: full flight Discharge Home Access: Stairs to enter Entrance Stairs-Rails: None Entrance Stairs-Number of Steps: 1-2 Discharge Bathroom Shower/Tub: Tub/shower unit Discharge Bathroom Toilet: Standard Discharge Bathroom Accessibility: Yes How Accessible: Accessible via walker Does the patient have any problems obtaining your medications?: No  Social/Family/Support Systems Anticipated Caregiver: multiple family members, main contact is Karel Jarvis (daughter), can also call [place] (dtr), or [place] (niece) Anticipated Caregiver's Contact Information: Karel Jarvis [phone]; Lowanda Foster [phone] Serve [phone] Ability/Limitations of Caregiver: none stated Caregiver Availability: 24/7 Discharge Plan Discussed with Primary Caregiver: Yes Is Caregiver In Agreement with Plan?: Yes Does Caregiver/Family have Issues with Lodging/Transportation while Pt is in Rehab?: No   Goals Patient/Family Goal for Rehab: PT/OT/SLP supervision to mod I Expected length of stay: 10-14 days Additional Information: Discharge plan: return to pt's home, family will provide assist initially at discharge Pt/Family Agrees to Admission and willing to participate: Yes Program Orientation Provided & Reviewed with Pt/Caregiver Including Roles  & Responsibilities: Yes  Barriers to Discharge: Insurance for SNF coverage, Home environment access/layout   Decrease burden of Care through IP rehab admission: n/a   Possible need for SNF placement upon discharge: Not anticipated.  Pt improving well now that HD resumed.  Plan for home to pt's house with family providing assist if he's not able to be mod I.    Patient Condition: This patient's medical and functional status has changed since the consult dated: 09/16/22 in which the Rehabilitation Physician  determined and documented that the patient's condition is appropriate for intensive rehabilitative care in an inpatient rehabilitation facility. See "History of Present Illness" (above) for medical update. Functional changes are: min assist/min guard with mobility and ADLs. Patient's medical and functional status update has been discussed with the Rehabilitation physician and patient remains appropriate for inpatient rehabilitation. Will admit to inpatient rehab today.  Preadmission Screen Completed By:  Stephania Fragmin, PT, 09/25/2022 10:08 AM ______________________________________________________________________   Discussed status with Dr. Shearon Stalls on 09/25/22 at 10:23 AM  and received approval for admission today.  Admission Coordinator:  Stephania Fragmin, time 10:23 AM /Date07/04/24

## 2022-09-25 NOTE — H&P (Signed)
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       HPI: Ryan [name] is a 62 year old RH male with history of  HTN, ESRD who was admitted on 09/07/22 with  severe back pain with spasms, cough, bowel incontinence and RLE weakness. He was noted to be febrile with T-102.5 and WBC 23,000 with reports of rhinorrhea and sore throat but Covid negative.  MRI thoracolumbar spine showed degenerative changes at L5-S1 with edema and concerns of early discitis with osteomyelitis.  Blood cultures positive for MSSA and Dr. Thedore Mins recommended broad spectrum antibiotics as well as TTE with repeat BC and removal of right internal jugular as this was felt to be a cause of infection.  He continued to have leucocytosis as well as question of constipation. Repeat CT abdomen pelvis showed SBO  with transition in right anterior abdomen area of mid bowel wall thickening as well as multiple new pulmonary nodules in RLL up to 17 mm. General surgery consulted and questioned infections v/s inflammatory enteritis v/s neoplastic infiltration but due to due to issues with diarrhea felt to be infectious in nature and did not feel NGT needed as patient without GI distress. Lungs nodules felt to be septic in  nature as patient without signs of PNA. Intractable hiccups managed with use of thorazine.    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 Rennis Golden due to concerns of anesthesia and risks outweighed benefits. On 06/27, family reported slurred speech as well as mental status changes and MRI brain done showing scattered subcentimeter ischemic infarcts in bilateral cerebral hemispheres and right cerebellum as well  as subtle evolving infarct in right ventral pons. EEG was positive for moderate diffuse encephalopathy but was negative for seizures. Antibiotics changed to Cefazolin on 06/25 for 8 weeks with EOT 11/04/22.  He did develop respiratory distress on 07/01 requiring BIPAP secondary to fluid overload with hyperkalemia due to IV access issues. L-internal jugular placed and underwent HD X 2 days with improvement in symptoms and now back to TTS schedule. He continues on 2 L oxygen per [place]. Diet liberalized and multiple supplements added by RD due to concerns of intake and moderate malnutrition.  Mentation has improved but continues to endorse delayed processing as well as problems with memory.  He did develop new petechial lesions bilateral feet overnight which are non tender. Therapy has been working with patient who requires min assist and multiple rest breaks as well as use of AD with mobility due to fatigue and debility. Also reporting lower back pain with spasms.    He lives alone, was driving and independent without AD prior to admission. Family to assist after discharge. CIR recommended due to functional decline.      Review of Systems  Constitutional:  Negative for chills and fever.  HENT:  Negative for hearing loss and tinnitus.   Eyes:  Negative for blurred vision and double vision.  Respiratory:  Negative for cough and shortness of breath.   Cardiovascular:  Negative for chest pain and leg swelling.  Gastrointestinal:  Negative for abdominal pain, constipation and heartburn.  Musculoskeletal:  Positive for back pain and myalgias.  Right shoulder weakness due to RTC pathology  Skin:        New lesion on feet since yesterday  Neurological:  Positive for sensory change (numbness/tingling bilateral feed) and weakness. Negative for dizziness and headaches.  Psychiatric/Behavioral:  The patient is nervous/anxious and has insomnia.           Past Medical History:  Diagnosis Date   Allergy      Anemia     Blood transfusion without reported diagnosis     Dialysis patient (HCC)      Tues,thurs,sat   ESRD (end stage renal disease) (HCC)      TTHS-    Family history of adverse reaction to anesthesia      father had allergic reaction with anesthesia with a dental procedure-(pt. doesn't know)   Hyperlipidemia     Hypertension             Past Surgical History:  Procedure Laterality Date   A/V FISTULAGRAM Left 12/15/2016    Procedure: A/V Fistulagram - left;  Surgeon: Chuck Hint, MD;  Location: [facility] INVASIVE CV LAB;  Service: Cardiovascular;  Laterality: Left;   AV FISTULA PLACEMENT Left 08/28/2016    Procedure: LEFT UPPER  ARM ARTERIOVENOUS (AV) FISTULA CREATION;  Surgeon: Chuck Hint, MD;  Location: [facility] OR;  Service: Vascular;  Laterality: Left;   DIALYSIS/PERMA CATHETER INSERTION Right 12/21/2017    Procedure: INSERTION OF DIALYSIS CATHETER Right Internal Jugular .;  Surgeon: Sherren Kerns, MD;  Location: [facility] OR;  Service: Vascular;  Laterality: Right;   FISTULA SUPERFICIALIZATION Left 09/23/2017    Procedure: FISTULA PLICATION LEFT ARM;  Surgeon: Sherren Kerns, MD;  Location: [facility] OR;  Service: Vascular;  Laterality: Left;   FISTULOGRAM Left 12/21/2017    Procedure: FISTULOGRAM with Balloon Angioplasty.;  Surgeon: Sherren Kerns, MD;  Location: [facility] OR;  Service: Vascular;  Laterality: Left;   HEMATOMA EVACUATION Left 08/27/2020    Procedure: EVACUATION HEMATOMA LEFT ARM;  Surgeon: Sherren Kerns, MD;  Location: [facility] OR;  Service: Vascular;  Laterality: Left;   IR FLUORO GUIDE CV LINE LEFT   09/22/2022   IR FLUORO GUIDE CV LINE RIGHT   09/12/2022   IR FLUORO GUIDE CV LINE RIGHT   09/17/2022   IR REMOVAL TUN CV CATH W/O FL   09/09/2022   IR THORACENTESIS ASP PLEURAL SPACE W/IMG GUIDE   08/22/2017    1.2 L -right-sided   IR THORACENTESIS ASP PLEURAL SPACE W/IMG GUIDE   10/19/2017   IR US GUIDE VASC ACCESS LEFT   09/22/2022   IR US GUIDE VASC ACCESS RIGHT    09/12/2022   KIDNEY TRANSPLANT       REVISON OF ARTERIOVENOUS FISTULA Left 12/21/2017    Procedure: PLICATION and Ligation of F LEFT ARM ARTERIOVENOUS FISTULA;  Surgeon: Sherren Kerns, MD;  Location: [facility] OR;  Service: Vascular;  Laterality: Left;   REVISON OF ARTERIOVENOUS FISTULA Left 07/16/2020    Procedure: EXCISION OF LEFT ARM ARTERIOVENOUS FISTULA;  Surgeon: Sherren Kerns, MD;  Location: [facility] OR;  Service: Vascular;  Laterality: Left;   stent in kidneys        dec 2017   TRANSTHORACIC ECHOCARDIOGRAM   09/22/2017     Severe LVH.  Normal function -EF 55-60%.  GRII DD.  Moderate RV dilation with mildly reduced RV function.   Fixed right coronary cusp with very mild aortic stenosis.  The myocardium has a speckled appearance --> .   Recommend cardiac  MRI to evaluate for amyloid   UPPER EXTREMITY VENOGRAPHY Bilateral 06/01/2020    Procedure: UPPER EXTREMITY VENOGRAPHY;  Surgeon: Chuck Hint, MD;  Location: [facility] INVASIVE CV LAB;  Service: Cardiovascular;  Laterality: Bilateral;           Family History  Problem Relation Age of Onset   Hypertension Mother     Heart disease Mother 72        By his report, he thinks that she had heart attack.   Stroke Mother     Cancer Father     Kidney cancer Father     Hypertension Sister     Heart disease Maternal Grandmother     Alcohol abuse Maternal Grandfather     Mental illness Paternal Grandmother     Learning disabilities Paternal Grandmother          Alzheimer's    Stroke Paternal Grandfather     Colon cancer Neg Hx     Colon polyps Neg Hx     Esophageal cancer Neg Hx     Rectal cancer Neg Hx     Stomach cancer Neg Hx        Social History:  Lives alone and independent PTA. Owns a Holiday representative firm.  Has 2 children in town who will assist after discharge. He  reports that he has never smoked. He has never used smokeless tobacco. He reports that he does not drink alcohol and does not use drugs.          Allergies  Allergen  Reactions   Azithromycin Nausea And Vomiting and Other (See Comments)      Chest tightness   Other Reaction(s): GI Intolerance   Iodinated Contrast Media Itching and Nausea Only      Probably needs prednisone prep prior to contrast               Facility-Administered Medications Prior to Admission  Medication Dose Route Frequency Provider Last Rate Last Admin   [DISCONTINUED] 0.9 %  sodium chloride infusion  250 mL Intravenous PRN Sherren Kerns, MD       [DISCONTINUED] sodium chloride flush (NS) 0.9 % injection 3 mL  3 mL Intravenous Q12H Fields, Janetta Hora, MD       [DISCONTINUED] sodium chloride flush (NS) 0.9 % injection 3 mL  3 mL Intravenous PRN Sherren Kerns, MD              Medications Prior to Admission  Medication Sig Dispense Refill   atenolol (TENORMIN) 25 MG tablet Take 50 mg by mouth daily.       atorvastatin (LIPITOR) 40 MG tablet Take 40 mg by mouth daily.       cyclobenzaprine (FLEXERIL) 10 MG tablet Take 1 tablet (10 mg total) by mouth 2 (two) times daily as needed for muscle spasms. 20 tablet 0   EPINEPHrine 0.3 mg/0.3 mL IJ SOAJ injection Inject 0.3 mg into the muscle as needed for anaphylaxis. 1 each 0   loratadine (CLARITIN) 10 MG tablet Take 10 mg by mouth daily.       multivitamin (RENA-VIT) TABS tablet Take 1 tablet by mouth daily.       sevelamer carbonate (RENVELA) 800 MG tablet Take 1 tablet (800 mg total) by mouth 3 (three) times daily with meals. (Patient taking differently: Take 800-1,600 mg by mouth See admin instructions. Take 2 tablets by mouth with each meal and 1 tablet with snacks) 90 tablet 0   tetrahydrozoline-zinc (VISINE-AC) 0.05-0.25 %  ophthalmic solution Place 2 drops into both eyes 3 (three) times daily as needed (dry eyes).       triamcinolone cream (KENALOG) 0.1 % Apply 1 application topically daily as needed (dry spots on face/excema).       Cetirizine HCl 10 MG CAPS Take 1 capsule (10 mg total) by mouth 1 day or 1 dose. (Patient not  taking: Reported on 09/08/2022) 30 capsule 0   Cholecalciferol (VITAMIN D3) 125 MCG (5000 UT) CAPS Take 5,000 Units by mouth daily. (Patient not taking: Reported on 09/08/2022)       cinacalcet (SENSIPAR) 30 MG tablet Take 30 mg by mouth at bedtime. (Patient not taking: Reported on 09/08/2022)   11   Ferrous Sulfate (IRON) 325 (65 Fe) MG TABS Take 325 mg by mouth at bedtime. (Patient not taking: Reported on 09/08/2022)       OVER THE COUNTER MEDICATION Take 1 tablet by mouth daily. Zinc and Copper (Patient not taking: Reported on 09/08/2022)            Home: Home Living Family/patient expects to be discharged to:: Private residence Living Arrangements: Alone Available Help at Discharge: Family, Available 24 hours/day Type of Home: House Home Access: Stairs to enter Secretary/administrator of Steps: 1 Home Layout: Two level, 1/2 bath on main level Alternate Level Stairs-Number of Steps: flight Bathroom Shower/Tub: Walk-in shower Home Equipment: None Additional Comments: has already made plans to stay on the main level until he makes a full recovery  Lives With: Alone   Functional History: Prior Function Prior Level of Function : Independent/Modified Independent, Driving Mobility Comments: Previously Independent without an AD ADLs Comments: Previously Independent to Mod I with pt stating he does not wear socks due to difficulty donning/doffing them and socks feet instead of washing secondary to pain in leaning forward to wash feet.   Functional Status:  Mobility: Bed Mobility Overal bed mobility: Needs Assistance Bed Mobility: Rolling, Sidelying to Sit Rolling: Min guard Sidelying to sit: Min guard Sit to supine: Mod assist General bed mobility comments: Pt beginning and ending session in recliner Transfers Overall transfer level: Needs assistance Equipment used: Rolling walker (2 wheels) Transfers: Sit to/from Stand Sit to Stand: Min guard Bed to/from chair/wheelchair/BSC transfer  type:: Step pivot Step pivot transfers: Min assist General transfer comment: From recliner with min guard for safety and cues for hand placement Ambulation/Gait Ambulation/Gait assistance: Min guard Gait Distance (Feet): 65 Feet Assistive device: Rolling walker (2 wheels) Gait Pattern/deviations: Step-through pattern, Trunk flexed General Gait Details: Pt demonstrating a slow step-through pattern with cues for upright posture. 2 standing rest breaks due to fatigue. Gait velocity: reduced Gait velocity interpretation: <1.31 ft/sec, indicative of household ambulator   ADL: ADL Overall ADL's : Needs assistance/impaired Eating/Feeding: Modified independent, Bed level Grooming: Modified independent, Bed level, Cueing for sequencing Upper Body Bathing: Moderate assistance, Sitting, Cueing for compensatory techniques, Cueing for sequencing Lower Body Bathing: Maximal assistance, Sit to/from stand, Cueing for compensatory techniques, Cueing for sequencing Upper Body Dressing : Minimal assistance, Cueing for compensatory techniques, Sitting, Cueing for sequencing Lower Body Dressing: Maximal assistance, Sit to/from stand, Cueing for sequencing, Cueing for compensatory techniques Lower Body Dressing Details (indicate cue type and reason): Pt able to donn socks while laying in bed with HOB up and crossing his legs in a "figure 4" pattern to get them on --it was  stretch for him but he was able to do it. Pt able to donn pj pants sitting in recliner with min-mod  A sit<>stand Toilet Transfer: Moderate assistance, Cueing for sequencing, Cueing for safety, Stand-pivot, BSC/3in1, Rolling walker (2 wheels) Toilet Transfer Details (indicate cue type and reason): Mod A sit<>stand with min A to take 3 steps to turn to recliner with RW. Toileting- Clothing Manipulation and Hygiene: Moderate assistance, Cueing for sequencing, Cueing for compensatory techniques, Sitting/lateral lean, Sit to/from stand Toileting -  Clothing Manipulation Details (indicate cue type and reason): Mod A sit<>stand General ADL Comments: OT educated pt in use of AE for increased safety and independence with ADLs and to assist in preventing pain exacerbation with bending and twisting. Pt celined sitting EOB this session secondary to pain and fatigue. Pt demonstrated understanding of AE in simulated tasks from bed level and/or teach back with pt stating he is agreeable to plan of trialling AE sitting EOB next OT session. OT also answered pt's general questions regarding available DME/AE for increased safety and independence in the bath/shower post acute discharge.   Cognition: Cognition Overall Cognitive Status: Within Functional Limits for tasks assessed Arousal/Alertness: Awake/alert Orientation Level: Oriented X4 Attention:  (intermittently distracted internally but able to redirect independently) Memory: Appears intact Awareness: Appears intact Problem Solving: Appears intact Executive Function: Reasoning Reasoning: Impaired Reasoning Impairment: Functional complex Safety/Judgment: Appears intact Cognition Arousal/Alertness: Awake/alert Behavior During Therapy: WFL for tasks assessed/performed Overall Cognitive Status: Within Functional Limits for tasks assessed Area of Impairment: Attention, Awareness, Problem solving, Memory, Safety/judgement, Following commands Current Attention Level: Sustained Memory: Decreased recall of precautions Following Commands: Follows one step commands with increased time Safety/Judgement: Decreased awareness of deficits, Decreased awareness of safety Awareness: Emergent Problem Solving: Requires verbal cues, Slow processing General Comments: Pt with increased difficulty with word finding and difficulty answering questions requiring increased processing (including How many quarters are there in $2.75?). This is new for pt. Pt previously worked in Education officer, environmental and in previous OT sesisons this  admission demonstrated cognition WNL including ability to quickly process tasks/questions requiring higher level cognition. Difficult to assess due to: Impaired communication (word and thought difficulties)     Blood pressure 100/70, pulse 74, temperature (!) 97.3 F (36.3 C), resp. rate 15, height 5\' 9"  (1.753 m), weight 81.3 kg, SpO2 100 %. Physical Exam Vitals and nursing note reviewed.  Constitutional: No apparent distress. Appropriate appearance for age.  HENT: No JVD. Neck Supple. Trachea midline. Atraumatic, normocephalic. Eyes: PERRLA. EOMI. Visual fields grossly intact.  Cardiovascular: RRR, no murmurs/rub/gallops. No Edema. Peripheral pulses 2+.  Respiratory: CTAB, decreased breath sounds bilaterally. No rales, rhonchi, or wheezing. On 4 L [place] Abdomen: + bowel sounds, normoactive. No distention or tenderness.  Skin: + L upper chest HD catheter - dressing c/d/i + Nonpainful erythematous lesions on bilateral feet  MSK:      No apparent deformity.      Strength: Exam limited d/t being on HD; all 4 limbs equal, antigravity and against resistance  Neurologic exam:  Cognition: AAO to person, place, time and event.  + Mild lethargy - intermittently falling asleep during late dialysis; more awake in early exam Language: Fluent, No substitutions or neoglisms. No dysarthria. Names 3/3 objects correctly.  Memory: Recalls 3/3 objects at 5 minutes. No apparent deficits  Insight: Good  insight into current condition.  Mood: Pleasant affect, appropriate mood.  Sensation: To light touch intact in BL UEs and LEs  Reflexes: 1+ in BL UE and LEs. Negative Hoffman's and babinski signs bilaterally.  CN: 2-12 grossly intact.  Coordination: No apparent tremors. No ataxia on FTN, HTS bilaterally.  Spasticity:  MAS 0 in all extremities.         Lab Results Last 48 Hours        Results for orders placed or performed during the hospital encounter of 09/07/22 (from the past 48 hour(s))   Comprehensive metabolic panel     Status: Abnormal    Collection Time: 09/24/22 12:22 AM  Result Value Ref Range    Sodium 128 (L) 135 - 145 mmol/L    Potassium 4.2 3.5 - 5.1 mmol/L    Chloride 90 (L) 98 - 111 mmol/L    CO2 26 22 - 32 mmol/L    Glucose, Bld 133 (H) 70 - 99 mg/dL      Comment: Glucose reference range applies only to samples taken after fasting for at least 8 hours.    BUN 38 (H) 8 - 23 mg/dL    Creatinine, Ser 8.65 (H) 0.61 - 1.24 mg/dL    Calcium 8.3 (L) 8.9 - 10.3 mg/dL    Total Protein 7.8 6.5 - 8.1 g/dL    Albumin 2.2 (L) 3.5 - 5.0 g/dL    AST 75 (H) 15 - 41 U/L    ALT 10 0 - 44 U/L    Alkaline Phosphatase 105 38 - 126 U/L    Total Bilirubin 0.6 0.3 - 1.2 mg/dL    GFR, Estimated 12 (L) >60 mL/min      Comment: (NOTE) Calculated using the CKD-EPI Creatinine Equation (2021)      Anion gap 12 5 - 15      Comment: Performed at [facility], [address], [place], [place] [address]  CBC     Status: Abnormal    Collection Time: 09/24/22 12:22 AM  Result Value Ref Range    WBC 13.9 (H) 4.0 - 10.5 K/uL    RBC 3.07 (L) 4.22 - 5.81 MIL/uL    Hemoglobin 8.0 (L) 13.0 - 17.0 g/dL    HCT 62.9 (L) 52.8 - 52.0 %    MCV 80.8 80.0 - 100.0 fL    MCH 26.1 26.0 - 34.0 pg    MCHC 32.3 30.0 - 36.0 g/dL    RDW 41.3 (H) 24.4 - 15.5 %    Platelets 315 150 - 400 K/uL    nRBC 0.0 0.0 - 0.2 %      Comment: Performed at [facility], [address], [place], [place] [address]      Imaging Results (Last 48 hours)  No results found.         Blood pressure 100/70, pulse 74, temperature (!) 97.3 F (36.3 C), resp. rate 15, height 5\' 9"  (1.753 m), weight 81.3 kg, SpO2 100 %.   Medical Problem List and Plan: 1. Functional deficits secondary to diskitis d/t MSSA bacteremia              -patient may shower             -ELOS/Goals: PT/OT/SLP supervision to mod I, 10-14 days 2.  Antithrombotics: -DVT/anticoagulation:  Pharmaceutical: Heparin              -antiplatelet therapy: N/A 3. Low back pain/Pain Management: Oxycodone prn. Advised to use oxycodone prior to sleep             --will schedule low dose robaxin bid as reports ongoing lower back and LE spasms.  4. Mood/Behavior/Sleep: LCSW to follow for evaluation and support.              -antipsychotic  agents: N/A             --acute on chronic insomnia in part due to anxiety. Will schedule low dose trazodone. Continue prn melatonin  5. Neuropsych/cognition: This patient is capable of making decisions on his own behalf. 6. Skin/Wound Care: Routine pressure relief measures             --continue to monitor new lesions on bilateral feet for evolution/progress   7. Fluids/Electrolytes/Nutrition: Strict I/O. Daily weights. Labs TTS with HD. Add low salt restrictions             --continue Nephro supplements 8. 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  --IV cefazolin with HD. EOT 11/04/22              --Fevers have resolved. CRP-44/Sed rate 1.5 @ admission.  --Leucocytosis trending down from 23.9-->31.4-->13.9.    9. SBO w/ enteritis: Has resolved.  10. ESRD: HD TTS at the end of the day to help with tolerance of therapy.              --strict I/O with daily weights. Continues to require 4 L oxygen per [place].  --On Liberalized diet but 1200 cc FR/4 gram salt.  Velphoro for elevated phos level.   11. Anemia of chronic disease: On Aranesp weekly every Thursday w/HD.  12. HTN: Monitor BP TID. On low dose tenormin but BP with drops during HD.  13. AHRF d/t cavitary pneumonia. Wean oxygen as able. Currently 2-4L; per patient not using CPAP/BiPAP     Jacquelynn Cree, PA-C 09/25/2022  I have examined the patient independently and edited the note for HPI, ROS, exam, assessment, and plan as appropriate. I am in agreement with the above recommendations.   Angelina Sheriff,  DO 09/25/2022

## 2022-09-25 NOTE — Progress Notes (Signed)
Inpatient Rehab Admissions Coordinator:    I have insurance approval and a bed available for pt to admit to CIR today. Dr. Jerral Ralph in agreement.  Will let pt/family and TOC team know.   Estill Dooms, PT, DPT Admissions Coordinator [phone] 09/25/22  10:04 AM

## 2022-09-25 NOTE — Procedures (Signed)
Patient was seen on dialysis and the procedure was supervised.  BFR 400  Via TDC BP is  107/70.   Patient appears to be tolerating treatment well.  Ryan [name] Jaynie Collins 09/25/2022

## 2022-09-26 DIAGNOSIS — M4626 Osteomyelitis of vertebra, lumbar region: Secondary | ICD-10-CM | POA: Diagnosis present

## 2022-09-26 DIAGNOSIS — N250 Renal osteodystrophy: Secondary | ICD-10-CM | POA: Diagnosis not present

## 2022-09-26 DIAGNOSIS — D631 Anemia in chronic kidney disease: Secondary | ICD-10-CM | POA: Diagnosis not present

## 2022-09-26 DIAGNOSIS — R7881 Bacteremia: Secondary | ICD-10-CM | POA: Diagnosis not present

## 2022-09-26 DIAGNOSIS — I120 Hypertensive chronic kidney disease with stage 5 chronic kidney disease or end stage renal disease: Secondary | ICD-10-CM | POA: Diagnosis not present

## 2022-09-26 DIAGNOSIS — Z992 Dependence on renal dialysis: Secondary | ICD-10-CM | POA: Diagnosis not present

## 2022-09-26 DIAGNOSIS — N186 End stage renal disease: Secondary | ICD-10-CM | POA: Diagnosis not present

## 2022-09-26 DIAGNOSIS — B9561 Methicillin susceptible Staphylococcus aureus infection as the cause of diseases classified elsewhere: Secondary | ICD-10-CM | POA: Diagnosis not present

## 2022-09-26 LAB — HEPATITIS B SURFACE ANTIGEN: Hepatitis B Surface Ag: NONREACTIVE

## 2022-09-26 MED ORDER — CEFAZOLIN SODIUM-DEXTROSE 2-4 GM/100ML-% IV SOLN
2.0000 g | INTRAVENOUS | Status: DC
  Administered 2022-09-30 – 2022-10-02 (×2): 2 g via INTRAVENOUS
  Filled 2022-09-26 (×3): qty 100

## 2022-09-26 MED ORDER — CEFAZOLIN SODIUM-DEXTROSE 2-4 GM/100ML-% IV SOLN: 2.0000 g | INTRAVENOUS | Status: DC

## 2022-09-26 MED ORDER — OXYCODONE HCL 5 MG PO TABS
10.0000 mg | ORAL_TABLET | ORAL | Status: DC | PRN
  Administered 2022-09-26 – 2022-09-27 (×3): 15 mg via ORAL
  Administered 2022-09-27 – 2022-09-30 (×2): 10 mg via ORAL
  Filled 2022-09-26 (×2): qty 2
  Filled 2022-09-26 (×3): qty 3

## 2022-09-26 MED ORDER — CHLORHEXIDINE GLUCONATE CLOTH 2 % EX PADS
6.0000 | MEDICATED_PAD | Freq: Two times a day (BID) | CUTANEOUS | Status: DC
  Administered 2022-09-26 – 2022-10-08 (×23): 6 via TOPICAL

## 2022-09-26 MED ORDER — CEFAZOLIN IN SODIUM CHLORIDE 3-0.9 GM/100ML-% IV SOLN
3.0000 g | INTRAVENOUS | Status: DC
  Administered 2022-09-28 – 2022-10-04 (×2): 3 g via INTRAVENOUS
  Filled 2022-09-26 (×2): qty 100

## 2022-09-26 MED ORDER — TRAZODONE HCL 50 MG PO TABS
75.0000 mg | ORAL_TABLET | Freq: Every day | ORAL | Status: DC
  Administered 2022-09-26 – 2022-09-30 (×5): 75 mg via ORAL
  Filled 2022-09-26 (×6): qty 2

## 2022-09-26 NOTE — Evaluation (Signed)
Physical Therapy Assessment and Plan  Patient Details  Name: Ryan [name] MRN: [id number] Date of Birth: Jun 05, 1960  PT Diagnosis: Abnormality of gait, Ataxic gait, Difficulty walking, Low back pain, and Muscle spasms Rehab Potential: Good ELOS: 10-14 days   Today's Date: 09/26/2022 PT Individual Time: 1050-1217 PT Individual Time Calculation (min): 87 min    Hospital Problem: Principal Problem:   Acute osteomyelitis of lumbar spine (HCC) Active Problems:   Diskitis   Past Medical History:  Past Medical History:  Diagnosis Date   Allergy    Anemia    Blood transfusion without reported diagnosis    Dialysis patient (HCC)    Tues,thurs,sat   ESRD (end stage renal disease) (HCC)    TTHS-    Family history of adverse reaction to anesthesia    father had allergic reaction with anesthesia with a dental procedure-(pt. doesn't know)   Hyperlipidemia    Hypertension    Past Surgical History:  Past Surgical History:  Procedure Laterality Date   A/V FISTULAGRAM Left 12/15/2016   Procedure: A/V Fistulagram - left;  Surgeon: Chuck Hint, MD;  Location: [facility] INVASIVE CV LAB;  Service: Cardiovascular;  Laterality: Left;   AV FISTULA PLACEMENT Left 08/28/2016   Procedure: LEFT UPPER  ARM ARTERIOVENOUS (AV) FISTULA CREATION;  Surgeon: Chuck Hint, MD;  Location: [facility] OR;  Service: Vascular;  Laterality: Left;   DIALYSIS/PERMA CATHETER INSERTION Right 12/21/2017   Procedure: INSERTION OF DIALYSIS CATHETER Right Internal Jugular .;  Surgeon: Sherren Kerns, MD;  Location: [facility] OR;  Service: Vascular;  Laterality: Right;   FISTULA SUPERFICIALIZATION Left 09/23/2017   Procedure: FISTULA PLICATION LEFT ARM;  Surgeon: Sherren Kerns, MD;  Location: [facility] OR;  Service: Vascular;  Laterality: Left;   FISTULOGRAM Left 12/21/2017   Procedure: FISTULOGRAM with Balloon Angioplasty.;  Surgeon: Sherren Kerns, MD;  Location: [facility] OR;  Service: Vascular;  Laterality: Left;    HEMATOMA EVACUATION Left 08/27/2020   Procedure: EVACUATION HEMATOMA LEFT ARM;  Surgeon: Sherren Kerns, MD;  Location: [facility] OR;  Service: Vascular;  Laterality: Left;   IR FLUORO GUIDE CV LINE LEFT  09/22/2022   IR FLUORO GUIDE CV LINE RIGHT  09/12/2022   IR FLUORO GUIDE CV LINE RIGHT  09/17/2022   IR REMOVAL TUN CV CATH W/O FL  09/09/2022   IR THORACENTESIS ASP PLEURAL SPACE W/IMG GUIDE  08/22/2017   1.2 L -right-sided   IR THORACENTESIS ASP PLEURAL SPACE W/IMG GUIDE  10/19/2017   IR US GUIDE VASC ACCESS LEFT  09/22/2022   IR US GUIDE VASC ACCESS RIGHT  09/12/2022   KIDNEY TRANSPLANT     REVISON OF ARTERIOVENOUS FISTULA Left 12/21/2017   Procedure: PLICATION and Ligation of F LEFT ARM ARTERIOVENOUS FISTULA;  Surgeon: Sherren Kerns, MD;  Location: [facility] OR;  Service: Vascular;  Laterality: Left;   REVISON OF ARTERIOVENOUS FISTULA Left 07/16/2020   Procedure: EXCISION OF LEFT ARM ARTERIOVENOUS FISTULA;  Surgeon: Sherren Kerns, MD;  Location: [facility] OR;  Service: Vascular;  Laterality: Left;   stent in kidneys     dec 2017   TRANSTHORACIC ECHOCARDIOGRAM  09/22/2017    Severe LVH.  Normal function -EF 55-60%.  GRII DD.  Moderate RV dilation with mildly reduced RV function.   Fixed right coronary cusp with very mild aortic stenosis.  The myocardium has a speckled appearance --> .   Recommend cardiac MRI to evaluate for amyloid   UPPER EXTREMITY VENOGRAPHY Bilateral 06/01/2020   Procedure: UPPER  EXTREMITY VENOGRAPHY;  Surgeon: Chuck Hint, MD;  Location: [facility] INVASIVE CV LAB;  Service: Cardiovascular;  Laterality: Bilateral;    Assessment & Plan Clinical Impression: Patient is a 62 year old RH male with history of  HTN, ESRD who was admitted on 09/07/22 with  severe back pain with spasms, cough, bowel incontinence and RLE weakness. He was noted to be febrile with T-102.5 and WBC 23,000 with reports of rhinorrhea and sore throat but Covid negative.  MRI thoracolumbar spine showed degenerative  changes at L5-S1 with edema and concerns of early discitis with osteomyelitis.  Blood cultures positive for MSSA and Dr. Thedore Mins recommended broad spectrum antibiotics as well as TTE with repeat BC and removal of right internal jugular as this was felt to be a cause of infection.  He continued to have leucocytosis as well as question of constipation. Repeat CT abdomen pelvis showed SBO  with transition in right anterior abdomen area of mid bowel wall thickening as well as multiple new pulmonary nodules in RLL up to 17 mm. General surgery consulted and questioned infections v/s inflammatory enteritis v/s neoplastic infiltration but due to due to issues with diarrhea felt to be infectious in nature and did not feel NGT needed as patient without GI distress. Lungs nodules felt to be septic in  nature as patient without signs of PNA. Intractable hiccups managed with use of thorazine.    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 Rennis Golden due to concerns of anesthesia and risks outweighed benefits. On 06/27, family reported slurred speech as well as mental status changes and MRI brain done showing scattered subcentimeter ischemic infarcts in bilateral cerebral hemispheres and right cerebellum as well as subtle evolving infarct in right ventral pons. EEG was positive for moderate diffuse encephalopathy but was negative for seizures. Antibiotics changed to Cefazolin on 06/25 for 8 weeks with EOT 11/04/22.  He did develop respiratory distress on 07/01 requiring BIPAP secondary to fluid overload with hyperkalemia due to IV access issues. L-internal jugular placed and underwent HD X 2 days with improvement in symptoms and now back to TTS schedule. He continues on 2 L oxygen per [place]. Diet liberalized and multiple supplements added by RD due to concerns of intake and moderate malnutrition.   Mentation has improved but continues to endorse delayed processing as well as problems with memory.  He did develop new petechial lesions bilateral feet overnight which are non tender. Therapy has been working with patient who requires min assist and multiple rest breaks as well as use of AD with mobility due to fatigue and debility. Also reporting lower back pain with spasms.    He lives alone, was driving and independent without AD prior to admission. Family to assist after discharge. CIR recommended due to functional decline.  Patient currently requires min with mobility secondary to muscle weakness, decreased cardiorespiratoy endurance, unbalanced muscle activation and decreased coordination,  , and decreased sitting balance, decreased standing balance, decreased postural control, decreased balance strategies, and pain with mobility .  Prior to hospitalization, patient was independent  with mobility and lived with Alone in a House home.  Home access is 2 curb steps - 1 from sidewalk leading to porch then 1 STE going in from porchStairs to enter.  Patient will benefit from skilled PT intervention to maximize safe functional mobility, minimize fall risk, and decrease caregiver burden  for planned discharge home with intermittent assist.  Anticipate patient will benefit from follow up [facility] at discharge.  PT - End of Session Activity Tolerance: Tolerates 30+ min activity without fatigue Endurance Deficit: Yes Endurance Deficit Description: decreased cardiopulmonary and muscular endurance, pain limits tolerance to activity PT Assessment Rehab Potential (ACUTE/IP ONLY): Good PT Barriers to Discharge: Inaccessible home environment;Decreased caregiver support;Home environment access/layout PT Barriers to Discharge Comments: lives alone but will most likely have intermittent assist at DC, 2 curb STE, bed/bath on 2nd floor PT Patient demonstrates impairments in the following area(s):  Balance;Pain;Motor;Endurance;Safety PT Transfers Functional Problem(s): Bed Mobility;Bed to Chair;Car PT Locomotion Functional Problem(s): Ambulation;Stairs PT Plan PT Intensity: Minimum of 1-2 x/day ,45 to 90 minutes PT Frequency: 5 out of 7 days PT Duration Estimated Length of Stay: 10-14 days PT Treatment/Interventions: Ambulation/gait training;Community reintegration;DME/adaptive equipment instruction;Neuromuscular re-education;Psychosocial support;Stair training;UE/LE Strength taining/ROM;Wheelchair propulsion/positioning;Balance/vestibular training;Discharge planning;Pain management;Therapeutic Activities;UE/LE Coordination activities;Disease management/prevention;Functional mobility training;Patient/family education;Splinting/orthotics;Therapeutic Exercise PT Transfers Anticipated Outcome(s): modI with RW PT Locomotion Anticipated Outcome(s): modI with RW ambulatory PT Recommendation Follow Up Recommendations: Home health PT Patient destination: Home Equipment Recommended: Rolling walker with 5" wheels   PT Evaluation Precautions/Restrictions Precautions Precautions: Fall Restrictions Weight Bearing Restrictions: No Pain Interference Pain Interference Pain Effect on Sleep: 4. Almost constantly Pain Interference with Therapy Activities: 2. Occasionally Pain Interference with Day-to-Day Activities: 3. Frequently Home Living/Prior Functioning Home Living Living Arrangements: Alone Available Help at Discharge: Family;Available 24 hours/day Type of Home: House Home Access: Stairs to enter [employer] of Steps: 2 curb steps - 1 from sidewalk leading to porch then 1 STE going in from [employer]: None Home Layout: Two level;Bed/bath upstairs;1/2 bath on main level Alternate Level Stairs-Number of Steps: flight Alternate Level Stairs-Rails: Right Bathroom Shower/Tub: Engineer, manufacturing systems: Standard Bathroom Accessibility: Yes Additional  Comments: has already made plans to stay on the main level until he makes a full recovery  Lives With: Alone Prior Function Level of Independence: Independent with basic ADLs;Independent with homemaking with ambulation;Independent with gait  Able to Take Stairs?: Yes Driving: Yes Vocation: Part time employment 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 Leisure: Hobbies-yes (Comment) (church, bible study, family oriented) Vision/Perception  Vision - History Ability to See in Adequate Light: 1 Impaired Vision - Assessment Eye Alignment: Within Functional Limits Ocular Range of Motion: Within Functional Limits Tracking/Visual Pursuits: Able to track stimulus in all quads without difficulty Saccades: Within functional limits Convergence: Impaired - to be further tested in functional context (eyes do not converge) Perception Perception: Within Functional Limits Praxis Praxis: Impaired Praxis Impairment Details: Initiation;Motor planning Praxis-Other Comments: Pt with noted initiation and motor planning/coordination deficits  Cognition Overall Cognitive Status: Within Functional Limits for tasks assessed Arousal/Alertness: Awake/alert Orientation Level: Oriented X4 Year: 2024 Month: July Day of Week: Incorrect Memory: Appears intact Awareness: Appears intact Problem Solving: Appears intact (increased time for problem solving) Reasoning: Impaired Reasoning Impairment: Functional complex Safety/Judgment: Appears intact (initiated locking W/C, safe sit to stand hand placement) Sensation Sensation Light Touch: Appears Intact Hot/Cold: Appears Intact Proprioception: Appears Intact (5/5 great toe bilaterally) Stereognosis: Not tested Coordination Gross Motor Movements are Fluid and Coordinated: No Fine Motor Movements are Fluid and Coordinated: No Coordination and Movement Description: mild incoordination, potential ataxia Motor  Motor Motor: Ataxia Motor -  Skilled Clinical Observations: mild ataxia noted functionally, unbalanced muscle activations possibly due to muscle spasms and low back pain during mobility   Trunk/Postural Assessment  Cervical Assessment Cervical Assessment: Within Functional Limits  Thoracic Assessment Thoracic Assessment: Exceptions to [facility] (decreased strength and ROM noted functionally due to pain) Lumbar Assessment Lumbar Assessment: Exceptions to [facility] (decreased strength and ROM noted functionally due to pain) Postural Control Postural Control: Deficits on evaluation Righting Reactions: decreased Protective Responses: decreased Postural Limitations: decreased  Balance Balance Balance Assessed: Yes Dynamic Sitting Balance Sitting balance - Comments: seated EOB reaching down for LB dressing Static Standing Balance Static Standing - Balance Support: Bilateral upper extremity supported Static Standing - Level of Assistance: 4: Min assist Dynamic Standing Balance Dynamic Standing - Balance Support: Bilateral upper extremity supported;During functional activity Dynamic Standing - Level of Assistance: 4: Min assist Extremity Assessment  RLE Assessment RLE Assessment: Exceptions to [facility] General Strength Comments: 3+/5 grossly LLE Assessment LLE Assessment: Exceptions to [facility] General Strength Comments: 3+/5 grossly  Care Tool Care Tool Bed Mobility Roll left and right activity   Roll left and right assist level: Moderate Assistance - Patient 50 - 74%    Sit to lying activity   Sit to lying assist level: Minimal Assistance - Patient > 75%    Lying to sitting on side of bed activity   Lying to sitting on side of bed assist level: the ability to move from lying on the back to sitting on the side of the bed with no back support.: Minimal Assistance - Patient > 75%     Care Tool Transfers Sit to stand transfer   Sit to stand assist level: Minimal Assistance - Patient > 75%    Chair/bed transfer   Chair/bed  transfer assist level: Minimal Assistance - Patient > 75%     Toilet transfer   Assist Level: Minimal Assistance - Patient > 75%    Car transfer   Car transfer assist level: Moderate Assistance - Patient 50 - 74% (simulated with into/out of bed)      Care Tool Locomotion Ambulation   Assist level: Minimal Assistance - Patient > 75% Assistive device: Walker-rolling Max distance: 170'  Walk 10 feet activity   Assist level: Minimal Assistance - Patient > 75% Assistive device: Walker-rolling   Walk 50 feet with 2 turns activity   Assist level: Minimal Assistance - Patient > 75% Assistive device: Walker-rolling  Walk 150 feet activity   Assist level: Minimal Assistance - Patient > 75% Assistive device: Walker-rolling  Walk 10 feet on uneven surfaces activity Walk 10 feet on uneven surfaces activity did not occur: Safety/medical concerns      Stairs Stair activity did not occur: Safety/medical concerns (pt with excessive pain following ambulation, unsafe to attempt stair negotiation)        Walk up/down 1 step activity Walk up/down 1 step or curb (drop down) activity did not occur: Safety/medical concerns      Walk up/down 4 steps activity Walk up/down 4 steps activity did not occur: Safety/medical concerns      Walk up/down 12 steps activity Walk up/down 12 steps activity did not occur: Safety/medical concerns      Pick up small objects from floor Pick up small object from the floor (from standing position) activity did not occur: Safety/medical concerns      Wheelchair Is the patient using a wheelchair?: Yes Type of Wheelchair: Manual   Wheelchair assist level: Dependent - Patient 0% (pain limits)    Wheel 50 feet with 2 turns activity   Assist Level: Dependent - Patient 0%  Wheel 150 feet activity   Assist Level: Dependent - Patient 0%    Refer to  Care Plan for Long Term Goals  SHORT TERM GOAL WEEK 1 PT Short Term Goal 1 (Week 1): Pt will complete sit<>stand  transfer with CGA and LRAD PT Short Term Goal 2 (Week 1): Pt will ambulate 200' with LRAD CGA PT Short Term Goal 3 (Week 1): Pt will complete bed mobility CGA without hospital bed features PT Short Term Goal 4 (Week 1): Pt will complete up/down 1 curb step with RW CGA  Recommendations for other services: None   Skilled Therapeutic Intervention Evaluation completed (see details above and below) with education on PT POC and goals and individual treatment initiated with focus on transfer training, pain education, gait training, and vitals management. Pt presents in room in [facility], reports pain unrated and declines intervention for pain at this time. Pt agreeable to PT evaluation and treatment. Pt completes subjective and objective assessment. Pt presents on 2L supplemental O2, SpO2 100%. Pt SpO2 100% on 2L with rest and mobility and noted SpO2 >97% on room air with rest and mobility, pt reports having used O2 due to panic attacks on acute however denies symptoms during session.  Pt provided with 2L portable O2 tank, transported in WC dependently due to pain and energy conservation to dayroom. Pt completes sit<>stand to RW with min assist for immediate standing balance and transitioning hands from WC to RW with pt with vocal outburst of pain with transition. Pt then ambulates with RW self selected distance 175', slow gait speed noted verbal cues provided for positioning and energy conservation with pt demonstrating muscle spasms with fatigue that pt reports as "shocks" that subside quickly. Pt with noted knee flexion during stance phase bilaterally, decreased foot clearance, more pronounced with fatigue. Pt with vocal outburst of pain with transition from stand to sit following gait requiring increased time for rest break to recover. Therapist provides education on positioning and mobility to decrease pain with pt verbalizing understanding.  Pt transported back to room pt placed on room air. Pt ambulates with RW  15' from [facility] to toilet with min assist, increased difficulty negotiating threshold in bathroom with pt demonstrating decreased proximity to RW with fatigue. Pt with vocal outburst of pain with transition to sitting on BSC. Pt remains sitting on toilet with distant supervision, pt occasionally coughs and demonstrates vocal outburst of pain. Pt unable to have BM. Pt requires CGA for doffing pants in standing with unilateral UE support on RW and requires max assist for donning pants with BUE support on RW. Pt demonstrating increased knee flexion with fatigue in standing however no LOB or buckling.  Pt then ambulates back to recliner chair with min assist and RW. Pt provided with kpad heating pad on back with education on heat as nonpharmologic intervention for pain. Pt remains seated in recliner with BLEs elevated, all needs within reach, call light in place and chair alarm donned at end of session. RN notified of pt remaining on room air with SpO2 >97% at end of session.  Mobility Bed Mobility Bed Mobility: Rolling Right;Rolling Left Rolling Right: Moderate Assistance - Patient 50-74% Rolling Left: Moderate Assistance - Patient 50-74% Transfers Transfers: Sit to Stand;Stand to Sit;Stand Pivot Transfers Sit to Stand: Minimal Assistance - Patient > 75% Stand to Sit: Minimal Assistance - Patient > 75% Stand Pivot Transfers: Minimal Assistance - Patient > 75% Stand Pivot Transfer Details: Tactile cues for weight shifting;Verbal cues for sequencing;Verbal cues for precautions/safety;Verbal cues for safe use of DME/AE Stand Pivot Transfer Details (indicate cue type and reason): cues  for use of RW and hand placement Transfer (Assistive device): Rolling walker Locomotion  Gait Ambulation: Yes Gait Assistance: Minimal Assistance - Patient > 75% Gait Distance (Feet): 175 Feet Assistive device: Rolling walker Gait Assistance Details: Tactile cues for posture;Verbal cues for sequencing;Verbal cues for  precautions/safety;Verbal cues for safe use of DME/AE Gait Assistance Details: min assist for postural stability with pt demonstrating anterior trunk lean and knee flexion bilaterally in stance phase Gait Gait: Yes Gait Pattern: Impaired Gait Pattern: Decreased stride length;Decreased dorsiflexion - right;Decreased dorsiflexion - left;Left flexed knee in stance;Right flexed knee in stance;Shuffle Gait velocity: decreased Stairs / Additional Locomotion Stairs: No (pain limits) Wheelchair Mobility Wheelchair Mobility: No   Discharge Criteria: Patient will be discharged from PT if patient refuses treatment 3 consecutive times without medical reason, if treatment goals not met, if there is a change in medical status, if patient makes no progress towards goals or if patient is discharged from hospital.  The above assessment, treatment plan, treatment alternatives and goals were discussed and mutually agreed upon: by patient  Edwin Cap PT, DPT 09/26/2022, 12:46 PM

## 2022-09-26 NOTE — Progress Notes (Signed)
Pharmacy Antibiotic Note  Ryan [name] is a 62 y.o. male with ESRD on HD via [facility]IJ admitted on 09/25/2022 with  MSSA bacteremia with native mitral valve endocarditis, L5-S1 discitis/osteomyelitis, and possible pulmonary septic emboli s/p 72h line holiday .  Pharmacy has been consulted for cefazolin dosing.  Plan is for cefazolin for 8 weeks (thru 8/13). WBC down to 13.9.   Plan: Per MD, cefazolin 2 g IV after HD on Tues-Thurs and cefazolin 3g IV after HD on Saturday  F/U HD schedule and doses are charted as given Pharmacy will continue to follow along  Height: 5\' 11"  (180.3 cm) Weight: 77.4 kg (170 lb 10.2 oz) IBW/kg (Calculated) : 75.3  Temp (24hrs), Avg:97.9 F (36.6 C), Min:97.7 F (36.5 C), Max:98.3 F (36.8 C)  Recent Labs  Lab 09/20/22 0250 09/21/22 0513 09/21/22 1037 09/22/22 0416 09/23/22 0105 09/24/22 0022  WBC 22.5* 24.6*  --  25.1* 17.5* 13.9*  CREATININE 11.38* 13.26* 13.01* 14.43* 7.70* 5.05*     Estimated Creatinine Clearance: 16.4 mL/min (A) (by C-G formula based on SCr of 5.05 mg/dL (H)).    Allergies  Allergen Reactions   Iodinated Contrast Media Itching and Nausea Only    Probably needs prednisone prep prior to contrast   Z-Pak [Azithromycin] Nausea And Vomiting and Other (See Comments)    Chest tightness GI Intolerance    Antimicrobials this admission: vanc x 1 on 6/16 cefepime x 1 on 6/16 metronidazole x 1 on 6/16 cefazolin 6/16 >>   Microbiology results: 6/16 blood: 4/4 MSSA  6/16 resp panel: neg 6/16 COVID: neg 6/17 blood: neg 6/19 blood: neg 6/20 MRSA PCR: neg   Thank you for involving pharmacy in this patient's care.  Andreas Ohm, PharmD Pharmacy Resident  09/26/2022 10:10 AM

## 2022-09-26 NOTE — Progress Notes (Signed)
Physical Medicine and Rehabilitation Consult Reason for Consult:CIR/ discitis L5/S1 Referring Physician: Dr Jacquelin Hawking     HPI: Ryan [name] is a 62 y.o.  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  He also was found to have SBO- infectious vs inflammatory- with a current WBC of 30.4; is on Ancef for bacteremia/infection.  His Na is 127 currently and also having intractable hiccups since admission per pt.    He notes his pain is 12/10 and causing his to catch his breath, he hurts so bad with supine to sit and sit to stand- but calms down when staying in same position.    LBM 2 days ago- had poor appetite, so doesn't feel constipated- his breakfast tray was untouched in room and lunch tray delivered while I was there. He notes "not hungry".    He has documented bowel incontinence in chart- having smears- and no documented urine- sounds anuric.      Review of Systems  Constitutional:  Positive for chills, malaise/fatigue and weight loss. Negative for fever.  HENT: Negative.    Eyes: Negative.   Respiratory:  Negative for cough, sputum production and shortness of breath.   Cardiovascular:  Negative for chest pain, palpitations and PND.  Gastrointestinal:  Positive for nausea.       Poor appetite  Genitourinary:        Anuric  Musculoskeletal:  Positive for back pain, joint pain and myalgias. Negative for neck pain.  Skin: Negative.   Neurological:  Positive for weakness. Negative for sensory change.  Endo/Heme/Allergies: Negative.   Psychiatric/Behavioral:  Negative for hallucinations, substance abuse and suicidal ideas. The patient is not nervous/anxious.   All other systems reviewed and are negative.        Past Medical History:  Diagnosis Date   Allergy     Anemia     Blood transfusion without reported diagnosis     Dialysis patient (HCC)      Tues,thurs,sat   ESRD (end stage renal disease) (HCC)      TTHS-    Family history of adverse reaction to anesthesia      father had allergic reaction with anesthesia with a dental procedure-(pt. doesn't know)   Hyperlipidemia     Hypertension           Past Surgical History:  Procedure Laterality Date   A/V FISTULAGRAM Left 12/15/2016    Procedure: A/V Fistulagram - left;  Surgeon: Chuck Hint, MD;  Location: [facility] INVASIVE CV LAB;  Service: Cardiovascular;  Laterality: Left;   AV FISTULA PLACEMENT Left 08/28/2016    Procedure: LEFT UPPER  ARM ARTERIOVENOUS (AV) FISTULA CREATION;  Surgeon: Chuck Hint, MD;  Location: [facility] OR;  Service: Vascular;  Laterality: Left;   DIALYSIS/PERMA CATHETER INSERTION Right 12/21/2017    Procedure: INSERTION OF DIALYSIS CATHETER Right Internal Jugular .;  Surgeon: Sherren Kerns, MD;  Location: [facility] OR;  Service: Vascular;  Laterality: Right;   FISTULA SUPERFICIALIZATION Left 09/23/2017    Procedure: FISTULA PLICATION LEFT ARM;  Surgeon: Sherren Kerns, MD;  Location: [facility] OR;  Service: Vascular;  Laterality: Left;   FISTULOGRAM Left 12/21/2017    Procedure: FISTULOGRAM with Balloon Angioplasty.;  Surgeon: Sherren Kerns, MD;  Location: [facility] OR;  Service: Vascular;  Laterality: Left;   HEMATOMA EVACUATION Left 08/27/2020    Procedure: EVACUATION HEMATOMA LEFT ARM;  Surgeon: Sherren Kerns, MD;  Location: [facility] OR;  Service: Vascular;  Laterality: Left;   IR FLUORO GUIDE CV LINE RIGHT   09/12/2022   IR REMOVAL TUN CV CATH W/O FL   09/09/2022   IR THORACENTESIS ASP PLEURAL SPACE W/IMG GUIDE   08/22/2017    1.2 L -right-sided   IR THORACENTESIS ASP PLEURAL SPACE W/IMG GUIDE   10/19/2017   IR US GUIDE VASC ACCESS RIGHT   09/12/2022   KIDNEY TRANSPLANT       REVISON OF ARTERIOVENOUS FISTULA Left  12/21/2017    Procedure: PLICATION and Ligation of F LEFT ARM ARTERIOVENOUS FISTULA;  Surgeon: Sherren Kerns, MD;  Location: [facility] OR;  Service: Vascular;  Laterality: Left;   REVISON OF ARTERIOVENOUS FISTULA Left 07/16/2020    Procedure: EXCISION OF LEFT ARM ARTERIOVENOUS FISTULA;  Surgeon: Sherren Kerns, MD;  Location: [facility] OR;  Service: Vascular;  Laterality: Left;   stent in kidneys        dec 2017   TRANSTHORACIC ECHOCARDIOGRAM   09/22/2017     Severe LVH.  Normal function -EF 55-60%.  GRII DD.  Moderate RV dilation with mildly reduced RV function.   Fixed right coronary cusp with very mild aortic stenosis.  The myocardium has a speckled appearance --> .   Recommend cardiac MRI to evaluate for amyloid   UPPER EXTREMITY VENOGRAPHY Bilateral 06/01/2020    Procedure: UPPER EXTREMITY VENOGRAPHY;  Surgeon: Chuck Hint, MD;  Location: [facility] INVASIVE CV LAB;  Service: Cardiovascular;  Laterality: Bilateral;         Family History  Problem Relation Age of Onset   Hypertension Mother     Heart disease Mother 57        By his report, he thinks that she had heart attack.   Stroke Mother     Cancer Father     Kidney cancer Father     Hypertension Sister     Heart disease Maternal Grandmother     Alcohol abuse Maternal Grandfather     Mental illness Paternal Grandmother     Learning disabilities Paternal Grandmother          Alzheimer's    Stroke Paternal Grandfather     Colon cancer Neg Hx     Colon polyps Neg Hx     Esophageal cancer Neg Hx     Rectal cancer Neg Hx     Stomach cancer Neg Hx      Social History:  reports that he has never smoked. He has never used smokeless tobacco. He reports that he does not drink alcohol and does not use drugs. Allergies:       Allergies  Allergen Reactions   Azithromycin Nausea And Vomiting and Other (See Comments)      Chest tightness   Other Reaction(s): GI Intolerance   Iodinated Contrast Media Itching and Nausea Only       Probably needs prednisone prep prior to contrast             Facility-Administered Medications Prior to Admission  Medication Dose Route Frequency Provider Last Rate Last Admin   [DISCONTINUED] 0.9 %  sodium chloride infusion  250 mL Intravenous PRN Sherren Kerns, MD       [DISCONTINUED] sodium chloride flush (NS) 0.9 % injection 3 mL  3 mL Intravenous Q12H Fields, Janetta Hora, MD       [DISCONTINUED] sodium chloride flush (NS) 0.9 % injection 3 mL  3 mL Intravenous PRN Sherren Kerns, MD              Medications Prior to Admission  Medication Sig Dispense Refill   atenolol (TENORMIN) 25 MG tablet Take 50 mg by mouth daily.       atorvastatin (LIPITOR) 40 MG tablet Take 40 mg by mouth daily.       cyclobenzaprine (FLEXERIL) 10 MG tablet Take 1 tablet (10 mg total) by mouth 2 (two) times daily as needed for muscle spasms. 20 tablet 0   EPINEPHrine 0.3 mg/0.3 mL IJ SOAJ injection Inject 0.3 mg into the muscle as needed for anaphylaxis. 1 each 0   loratadine (CLARITIN) 10 MG tablet Take 10 mg by mouth daily.       multivitamin (RENA-VIT) TABS tablet Take 1 tablet by mouth daily.       sevelamer carbonate (RENVELA) 800 MG tablet Take 1 tablet (800 mg total) by mouth 3 (three) times daily with meals. (Patient taking differently: Take 800-1,600 mg by mouth See admin instructions. Take 2 tablets by mouth with each meal and 1 tablet with snacks) 90 tablet 0   tetrahydrozoline-zinc (VISINE-AC) 0.05-0.25 % ophthalmic solution Place 2 drops into both eyes 3 (three) times daily as needed (dry eyes).       triamcinolone cream (KENALOG) 0.1 % Apply 1 application topically daily as needed (dry spots on face/excema).       Cetirizine HCl 10 MG CAPS Take 1 capsule (10 mg total) by mouth 1 day or 1 dose. (Patient not taking: Reported on 09/08/2022) 30 capsule 0   Cholecalciferol (VITAMIN D3) 125 MCG (5000 UT) CAPS Take 5,000 Units by mouth daily. (Patient not taking: Reported on 09/08/2022)       cinacalcet  (SENSIPAR) 30 MG tablet Take 30 mg by mouth at bedtime. (Patient not taking: Reported on 09/08/2022)   11   Ferrous Sulfate (IRON) 325 (65 Fe) MG TABS Take 325 mg by mouth at bedtime. (Patient not taking: Reported on 09/08/2022)       OVER THE COUNTER MEDICATION Take 1 tablet by mouth daily. Zinc and Copper (Patient not taking: Reported on 09/08/2022)          Home: Home Living Family/patient expects to be discharged to:: Private residence Living Arrangements: Alone Available Help at Discharge: Family, Available 24 hours/day Type of Home: House Home Access: Stairs to enter Secretary/administrator of Steps: 1 Home Layout: Two level, 1/2 bath on main level Alternate Level Stairs-Number of Steps: flight Bathroom Shower/Tub: Walk-in shower Home Equipment: None Additional Comments: has already made plans to stay on the main level until he makes a full recovery  Functional History: Prior Function Prior Level of Function : Independent/Modified Independent, Driving Mobility Comments: Previously Independent without an AD ADLs Comments: Previously Independent to Mod I with pt stating he does not wear socks due to difficulty donning/doffing them and socks feet instead of washing secondary to pain in leaning forward to wash feet. Functional Status:  Mobility: Bed Mobility Overal bed mobility: Needs Assistance Bed Mobility: Rolling, Sidelying to Sit Rolling: Min assist (increased time) Sidelying to sit: Mod assist (some for legs and trunk) Sit to supine: Mod assist General bed mobility comments: Assist to elevate trunk into sitting and bring legs off of bed. Transfers Overall transfer level: Needs assistance Equipment used: Rolling walker (2 wheels)  Transfers: Sit to/from Stand, Bed to chair/wheelchair/BSC Sit to Stand: Mod assist Bed to/from chair/wheelchair/BSC transfer type:: Step pivot Step pivot transfers: Min assist General transfer comment: Assist to power up and for stability. Verbal  cues for hand placement. Ambulation/Gait Ambulation/Gait assistance: Min assist Gait Distance (Feet): 5 Feet Assistive device: Rolling walker (2 wheels) Gait Pattern/deviations: Step-through pattern, Decreased stride length, Trunk flexed General Gait Details: Assist for balance and support. Tremulous Gait velocity: decr Gait velocity interpretation: <1.31 ft/sec, indicative of household ambulator   ADL: ADL Overall ADL's : Needs assistance/impaired Eating/Feeding: Independent, Bed level Grooming: Modified independent, Bed level Upper Body Bathing: Moderate assistance, Sitting, Cueing for compensatory techniques Lower Body Bathing: Maximal assistance, Sit to/from stand, Cueing for compensatory techniques Upper Body Dressing : Moderate assistance, Sitting, Cueing for compensatory techniques Lower Body Dressing: Maximal assistance, Sit to/from stand, Cueing for compensatory techniques Toilet Transfer: Minimal assistance, BSC/3in1, Rolling walker (2 wheels) (Step-pivot transfer) Toilet Transfer Details (indicate cue type and reason): Simulated bed (raised) to recliner. Pt is Mod A sit<>stand and min A stand turn with RW, VCs for safe hand placement Toileting- Clothing Manipulation and Hygiene: Total assistance Toileting - Clothing Manipulation Details (indicate cue type and reason): Mod A sit<>stand General ADL Comments: OT educated pt in use of AE for increased safety and independence with ADLs and to assist in preventing pain exacerbation with bending and twisting. Pt celined sitting EOB this session secondary to pain and fatigue. Pt demonstrated understanding of AE in simulated tasks from bed level and/or teach back with pt stating he is agreeable to plan of trialling AE sitting EOB next OT session. OT also answered pt's general questions regarding available DME/AE for increased safety and independence in the bath/shower post acute discharge.   Cognition: Cognition Overall Cognitive Status:  Within Functional Limits for tasks assessed Orientation Level: Oriented X4 Cognition Arousal/Alertness: Awake/alert Behavior During Therapy: WFL for tasks assessed/performed Overall Cognitive Status: Within Functional Limits for tasks assessed General Comments: Pt is very talkative and very tangential, tends to talk but not look at you while talking (eyes are open)   Blood pressure 110/68, pulse 68, temperature 97.8 F (36.6 C), resp. rate 18, height 5\' 9"  (1.753 m), weight 81.2 kg, SpO2 96 %. Physical Exam Vitals and nursing note reviewed. Exam conducted with a chaperone present.  Constitutional:      Appearance: Normal appearance. He is normal weight.     Comments: Pt sitting up in bed initially- OT in room; NAD when sitting still, but really obviously in pain when sitting EOB or standing with OT; alert, appropriate  HENT:     Head: Normocephalic and atraumatic.     Right Ear: External ear normal.     Left Ear: External ear normal.     Nose: Nose normal. No congestion.     Mouth/Throat:     Mouth: Mucous membranes are dry.     Pharynx: Oropharynx is clear. No oropharyngeal exudate.  Eyes:     General:        Right eye: No discharge.        Left eye: No discharge.     Extraocular Movements: Extraocular movements intact.  Cardiovascular:     Rate and Rhythm: Normal rate and regular rhythm.     Heart sounds: Normal heart sounds. No murmur heard.    No gallop.  Pulmonary:     Effort: Pulmonary effort is normal. No respiratory distress.     Breath sounds: Normal breath sounds. No wheezing, rhonchi  or rales.     Comments: Intractable hiccups esp when talking Also a hitch in his breathing when hit by pain, which occurred mainly with change in position Abdominal:     General: Bowel sounds are normal. There is distension.     Palpations: Abdomen is soft.     Tenderness: There is no abdominal tenderness.  Musculoskeletal:     Cervical back: Neck supple. No tenderness.     Comments:  Ue's 5-/5 in biceps, triceps, WE< grip and FA B/L LE's at least 4-/5 B/L- couldn't test directly because eof therapy moving pt- but able ot sit to stand with RW- appeared equal  Skin:    General: Skin is warm and dry.     Comments: R internal jugular catheter Covered Also no back incision- backside no ulcers  Neurological:     Mental Status: He is alert.     Comments: Intact to light touch in all 4 extremities except for ankles/feet- chronic neuropathy Jumped with mild touch on L4/S1- very TTP  Psychiatric:     Comments: Slightly anxious due to anticipation of pain        Lab Results Last 24 Hours       Results for orders placed or performed during the hospital encounter of 09/07/22 (from the past 24 hour(s))  Ferritin     Status: Abnormal    Collection Time: 09/16/22  3:38 AM  Result Value Ref Range    Ferritin 5,276 (H) 24 - 336 ng/mL  Iron and TIBC     Status: Abnormal    Collection Time: 09/16/22  3:38 AM  Result Value Ref Range    Iron 20 (L) 45 - 182 ug/dL    TIBC 308 (L) 657 - 846 ug/dL    Saturation Ratios 16 (L) 17.9 - 39.5 %    UIBC 109 ug/dL  CBC     Status: Abnormal    Collection Time: 09/16/22  3:38 AM  Result Value Ref Range    WBC 30.4 (H) 4.0 - 10.5 K/uL    RBC 3.22 (L) 4.22 - 5.81 MIL/uL    Hemoglobin 8.9 (L) 13.0 - 17.0 g/dL    HCT 96.2 (L) 95.2 - 52.0 %    MCV 79.8 (L) 80.0 - 100.0 fL    MCH 27.6 26.0 - 34.0 pg    MCHC 34.6 30.0 - 36.0 g/dL    RDW 84.1 32.4 - 40.1 %    Platelets 278 150 - 400 K/uL    nRBC 0.0 0.0 - 0.2 %  Renal function panel     Status: Abnormal    Collection Time: 09/16/22  3:38 AM  Result Value Ref Range    Sodium 127 (L) 135 - 145 mmol/L    Potassium 5.2 (H) 3.5 - 5.1 mmol/L    Chloride 89 (L) 98 - 111 mmol/L    CO2 18 (L) 22 - 32 mmol/L    Glucose, Bld 98 70 - 99 mg/dL    BUN 027 (H) 8 - 23 mg/dL    Creatinine, Ser 25.36 (H) 0.61 - 1.24 mg/dL    Calcium 8.0 (L) 8.9 - 10.3 mg/dL    Phosphorus 64.4 (H) 2.5 - 4.6 mg/dL     Albumin 1.6 (L) 3.5 - 5.0 g/dL    GFR, Estimated 4 (L) >60 mL/min    Anion gap 20 (H) 5 - 15      Imaging Results (Last 48 hours)  No results found.  Assessment/Plan: Diagnosis: MSSA bacteremia  with L5/S1 discitis/osteomyelitis-  Does the need for close, 24 hr/day medical supervision in concert with the patient's rehab needs make it unreasonable for this patient to be served in a less intensive setting? Yes Co-Morbidities requiring supervision/potential complications: endocarditis on Ancef; MSSA bacteremia- LE weakness; intractable hiccups; ESRD on HD; Leukocytosis up to 30k currently; SBO- improving Due to bowel management, safety, skin/wound care, disease management, medication administration, pain management, and patient education, does the patient require 24 hr/day rehab nursing? Yes Does the patient require coordinated care of a physician, rehab nurse, therapy disciplines of PT and OT to address physical and functional deficits in the context of the above medical diagnosis(es)? Yes Addressing deficits in the following areas: balance, endurance, locomotion, strength, transferring, bowel/bladder control, bathing, dressing, feeding, grooming, and toileting Can the patient actively participate in an intensive therapy program of at least 3 hrs of therapy per day at least 5 days per week? Yes The potential for patient to make measurable gains while on inpatient rehab is good Anticipated functional outcomes upon discharge from inpatient rehab are supervision and min assist  with PT, supervision and min assist with OT, n/a with SLP. Estimated rehab length of stay to reach the above functional goals is: 2-3 weeks Anticipated discharge destination: Home Overall Rehab/Functional Prognosis: good   RECOMMENDATIONS: This patient's condition is appropriate for continued rehabilitative care in the following setting: CIR Patient has agreed to participate in recommended program. Yes Note that  insurance prior authorization may be required for reimbursement for recommended care.   Comment:  Suggest increasing Oxycodone to 10-15 mg q4 hours for pain Try not to use IV Dilaudid since don't use in CIR.  Pt needs improvement in WBC before CIR admission/per primary team/ID.  Might end up benefiting from long acting pain meds- since discitis/osteomyelitis is one of the most painful dx's to have.  Pt not eating- might benefit from appetite stimulant/supplements.  Will submit for inpt CIR admission.  7. Thank you for this consult.      I spent a total of  75  minutes on total care today- >50% coordination of care- due to  Due to review of chart, examination and interview of patient- was in room 45 minutes- with OT and monitored movement with OT; and typing up note-   Genice Rouge, MD 09/16/2022

## 2022-09-26 NOTE — IPOC Note (Signed)
Overall Plan of Care [facility]) Patient Details Name: Ryan [name] MRN: [id number] DOB: 1961-03-20  Admitting Diagnosis: Acute osteomyelitis of lumbar spine [facility])  Hospital Problems: Principal Problem:   Acute osteomyelitis of lumbar spine (HCC) Active Problems:   Diskitis     Functional Problem List: Nursing Bowel, Endurance, Medication Management, Motor, Pain, Safety  PT Balance, Pain, Motor, Endurance, Safety  OT Balance, Endurance, Motor  SLP Cognition  TR         Basic ADL's: OT Eating, Grooming, Bathing, Dressing, Toileting     Advanced  ADL's: OT Simple Meal Preparation     Transfers: PT Bed Mobility, Bed to Chair, Car  OT Toilet, Tub/Shower     Locomotion: PT Ambulation, Stairs     Additional Impairments: OT    SLP Social Cognition   Problem Solving, Memory  TR      Anticipated Outcomes Item Anticipated Outcome  Self Feeding Mod I  Swallowing      Basic self-care  Mod I  Toileting  Mod I   Bathroom Transfers mod I  Bowel/Bladder  continent B/B  Transfers  modI with RW  Locomotion  modI with RW ambulatory  Communication     Cognition  Mod I  Pain  less than 4  Safety/Judgment  no falls   Therapy Plan: PT Intensity: Minimum of 1-2 x/day ,45 to 90 minutes PT Frequency: 5 out of 7 days PT Duration Estimated Length of Stay: 10-14 days OT Intensity: Minimum of 1-2 x/day, 45 to 90 minutes OT Frequency: 5 out of 7 days SLP Intensity: Minumum of 1-2 x/day, 30 to 90 minutes SLP Frequency: 1 to 3 out of 7 days SLP Duration/Estimated Length of Stay: 10-12 days   Team Interventions: Nursing Interventions Patient/Family Education, Medication Management, Psychosocial Support, Bowel Management, Disease Management/Prevention, Pain Management, Discharge Planning  PT interventions Ambulation/gait training, Community reintegration, DME/adaptive equipment instruction, Neuromuscular re-education, Psychosocial support, Stair training, UE/LE Strength  taining/ROM, Wheelchair propulsion/positioning, Warden/ranger, Discharge planning, Pain management, Therapeutic Activities, UE/LE Coordination activities, Disease management/prevention, Functional mobility training, Patient/family education, Splinting/orthotics, Therapeutic Exercise  OT Interventions Balance/vestibular training, Discharge planning, Self Care/advanced ADL retraining, Therapeutic Activities, UE/LE Coordination activities, Functional mobility training, Patient/family education, Therapeutic Exercise, Community reintegration, Fish farm manager, Neuromuscular re-education, UE/LE Strength taining/ROM  SLP Interventions Cognitive remediation/compensation, Financial trader, Environmental controls, Functional tasks, Internal/external aids, Patient/family education, Speech/Language facilitation, Therapeutic Activities  TR Interventions    SW/CM Interventions Discharge Planning, Psychosocial Support, Patient/Family Education   Barriers to Discharge MD  Medical stability, Home enviroment access/loayout, IV antibiotics, Wound care, Lack of/limited family support, Hemodialysis, and Weight bearing restrictions  Nursing Home environment access/layout, Decreased caregiver support, IV antibiotics, Hemodialysis, Medication compliance home alone with family check ins. 2 story with B/B up flight of steps 1-2 ste entry no rail. 1/2 bath on main level  PT Inaccessible home environment, Decreased caregiver support, Home environment access/layout lives alone but will most likely have intermittent assist at DC, 2 curb STE, bed/bath on 2nd floor  OT Inaccessible home environment, Decreased caregiver support, Hemodialysis lives alone, although family able to assist  SLP      SW       Team Discharge Planning: Destination: PT-Home ,OT- Home , SLP-Home Projected Follow-up: PT-Home health PT, OT-  Home health OT, SLP-None Projected Equipment Needs: PT-Rolling walker with 5"  wheels, OT- 3 in 1 bedside comode, To be determined, Rolling walker with 5" wheels, Tub/shower bench, SLP-None recommended by SLP Equipment Details: PT- , OT-  Patient/family  involved in discharge planning: PT- Patient,  OT-Patient, SLP-Patient  MD ELOS: 10-12 days Medical Rehab Prognosis:  Good Assessment: The patient has been admitted for CIR therapies with the diagnosis of lumbar osteomyelitis/diskitis and bacteremia. The team will be addressing functional mobility, strength, stamina, balance, safety, adaptive techniques and equipment, self-care, bowel and bladder mgt, patient and caregiver education, . Goals have been set at mod I. Anticipated discharge destination is home.        See Team Conference Notes for weekly updates to the plan of care

## 2022-09-26 NOTE — Progress Notes (Signed)
Met with patient who was doing therapy.  Briefly discussed educational binder and team conference every Tuesday. Discussed renal diet, foods to avoid as well as increasing good cholesterol levels. Also to monitor weight and B/P when at home. All needs met.

## 2022-09-26 NOTE — Evaluation (Signed)
Speech Language Pathology Assessment and Plan  Patient Details  Name: Ryan [name] MRN: [id number] Date of Birth: 09-12-1960  SLP Diagnosis: Cognitive Impairments  Rehab Potential: Excellent ELOS: 10-12 days    Today's Date: 09/26/2022 SLP Individual Time: 4696-2952 SLP Individual Time Calculation (min): 55 min   Hospital Problem: Principal Problem:   Acute osteomyelitis of lumbar spine (HCC) Active Problems:   Diskitis  Past Medical History:  Past Medical History:  Diagnosis Date   Allergy    Anemia    Blood transfusion without reported diagnosis    Dialysis patient (HCC)    Tues,thurs,sat   ESRD (end stage renal disease) (HCC)    TTHS-    Family history of adverse reaction to anesthesia    father had allergic reaction with anesthesia with a dental procedure-(pt. doesn't know)   Hyperlipidemia    Hypertension    Past Surgical History:  Past Surgical History:  Procedure Laterality Date   A/V FISTULAGRAM Left 12/15/2016   Procedure: A/V Fistulagram - left;  Surgeon: Chuck Hint, MD;  Location: [facility] INVASIVE CV LAB;  Service: Cardiovascular;  Laterality: Left;   AV FISTULA PLACEMENT Left 08/28/2016   Procedure: LEFT UPPER  ARM ARTERIOVENOUS (AV) FISTULA CREATION;  Surgeon: Chuck Hint, MD;  Location: [facility] OR;  Service: Vascular;  Laterality: Left;   DIALYSIS/PERMA CATHETER INSERTION Right 12/21/2017   Procedure: INSERTION OF DIALYSIS CATHETER Right Internal Jugular .;  Surgeon: Sherren Kerns, MD;  Location: [facility] OR;  Service: Vascular;  Laterality: Right;   FISTULA SUPERFICIALIZATION Left 09/23/2017   Procedure: FISTULA PLICATION LEFT ARM;  Surgeon: Sherren Kerns, MD;  Location: [facility] OR;  Service: Vascular;  Laterality: Left;   FISTULOGRAM Left 12/21/2017   Procedure: FISTULOGRAM with Balloon Angioplasty.;  Surgeon: Sherren Kerns, MD;  Location: [facility] OR;  Service: Vascular;  Laterality: Left;   HEMATOMA EVACUATION Left 08/27/2020   Procedure:  EVACUATION HEMATOMA LEFT ARM;  Surgeon: Sherren Kerns, MD;  Location: [facility] OR;  Service: Vascular;  Laterality: Left;   IR FLUORO GUIDE CV LINE LEFT  09/22/2022   IR FLUORO GUIDE CV LINE RIGHT  09/12/2022   IR FLUORO GUIDE CV LINE RIGHT  09/17/2022   IR REMOVAL TUN CV CATH W/O FL  09/09/2022   IR THORACENTESIS ASP PLEURAL SPACE W/IMG GUIDE  08/22/2017   1.2 L -right-sided   IR THORACENTESIS ASP PLEURAL SPACE W/IMG GUIDE  10/19/2017   IR US GUIDE VASC ACCESS LEFT  09/22/2022   IR US GUIDE VASC ACCESS RIGHT  09/12/2022   KIDNEY TRANSPLANT     REVISON OF ARTERIOVENOUS FISTULA Left 12/21/2017   Procedure: PLICATION and Ligation of F LEFT ARM ARTERIOVENOUS FISTULA;  Surgeon: Sherren Kerns, MD;  Location: [facility] OR;  Service: Vascular;  Laterality: Left;   REVISON OF ARTERIOVENOUS FISTULA Left 07/16/2020   Procedure: EXCISION OF LEFT ARM ARTERIOVENOUS FISTULA;  Surgeon: Sherren Kerns, MD;  Location: [facility] OR;  Service: Vascular;  Laterality: Left;   stent in kidneys     dec 2017   TRANSTHORACIC ECHOCARDIOGRAM  09/22/2017    Severe LVH.  Normal function -EF 55-60%.  GRII DD.  Moderate RV dilation with mildly reduced RV function.   Fixed right coronary cusp with very mild aortic stenosis.  The myocardium has a speckled appearance --> .   Recommend cardiac MRI to evaluate for amyloid   UPPER EXTREMITY VENOGRAPHY Bilateral 06/01/2020   Procedure: UPPER EXTREMITY VENOGRAPHY;  Surgeon: Chuck Hint, MD;  Location:  [facility] INVASIVE CV LAB;  Service: Cardiovascular;  Laterality: Bilateral;    Assessment / Plan / Recommendation Clinical Impression Patient is a 62 y/o male with PMH of HTN, HLD, and ESRD (HD TRS), who admitted to [facility] on 09/07/22 with c/o back pain. In ED tmax 102.5, SBP 161, WBC 23.9, Hgb 11.1, BUN 43, Creatinine 8.42 and he was admitted for sepsis criteria and started on broad spectrum abx. CT chest showed multiple pulmonary nodules as well as some dilated loops of jejunum and ileum  concerning for possible obstruction. MRI T/L spine ordered and showed indeterminate changes at L5-S1 suggestive of early discitis/osteomyelitis. Blood cultures MSSA bacteremia. ID consulted and felt bacteremia likely due to line infection and recommended TDC exchange, IV abx, and recurrent blood culture surveillance until negative. Echo performed and showed probably vegetation. TTE confirmed mitral valve endocarditis. Pt with ongoing confusion and CT scan chest/brain showed septic emboli to both locations, as well as new airspace oacity in LUL consistent with cavitary PNA. Pt unable to complete line exchange due to elevated WBC and became uremic. Hospital course complicated by SBO, which has resolved. Therapy ongoing and pt was recommended for CIR. Patient admitted 09/25/22.  Upon arrival, patient was sitting upright in the recliner. Patient reporting severe pain and requested to lay back down in bed. Patient required more than a reasonable amount of time and multiple attempts to stand due to pain. Once comfortable in bed, patient able to fully participate in evaluation. SLP administered the COGNISTAT with patient demonstrating mild deficits in short-term recall and complex problem solving. Patient demonstrated appropriate awareness and reported that he was somewhat surprised regarding difficulty of tasks.   Patient would benefit from skilled SLP intervention to maximize his cognitive functioning and overall functional independence prior to discharge.    Skilled Therapeutic Interventions          Administered a cognitive-linguistic evaluation, please see above for details.   SLP Assessment  Patient will need skilled Speech Lanaguage Pathology Services during CIR admission    Recommendations  Oral Care Recommendations: Oral care BID Recommendations for Other Services: Neuropsych consult Patient destination: Home Follow up Recommendations: None Equipment Recommended: None recommended by SLP    SLP  Frequency 1 to 3 out of 7 days   SLP Duration  SLP Intensity  SLP Treatment/Interventions 10-12 days  Minumum of 1-2 x/day, 30 to 90 minutes  Cognitive remediation/compensation;Cueing hierarchy;Environmental controls;Functional tasks;Internal/external aids;Patient/family education;Speech/Language facilitation;Therapeutic Activities    Pain Pain Assessment Pain Score: 5 /10 in back Patient repositioned and premedicated   SLP Evaluation Cognition Overall Cognitive Status: Impaired/Different from baseline Arousal/Alertness: Awake/alert Orientation Level: Oriented X4 Memory: Impaired Memory Impairment: Decreased short term memory Decreased Short Term Memory: Verbal complex Awareness: Appears intact Problem Solving: Impaired Problem Solving Impairment: Functional basic Reasoning: Appears intact Safety/Judgment: Appears intact  Comprehension Auditory Comprehension Overall Auditory Comprehension: Appears within functional limits for tasks assessed Expression Expression Primary Mode of Expression: Verbal Verbal Expression Overall Verbal Expression: Appears within functional limits for tasks assessed Written Expression Dominant Hand: Right Written Expression: Within Functional Limits Oral Motor Oral Motor/Sensory Function Overall Oral Motor/Sensory Function: Within functional limits Motor Speech Overall Motor Speech: Appears within functional limits for tasks assessed  Care Tool Care Tool Cognition Ability to hear (with hearing aid or hearing appliances if normally used Ability to hear (with hearing aid or hearing appliances if normally used): 0. Adequate - no difficulty in normal conservation, social interaction, listening to TV   Expression of Ideas and  Wants Expression of Ideas and Wants: 4. Without difficulty (complex and basic) - expresses complex messages without difficulty and with speech that is clear and easy to understand   Understanding Verbal and Non-Verbal  Content Understanding Verbal and Non-Verbal Content: 4. Understands (complex and basic) - clear comprehension without cues or repetitions  Memory/Recall Ability Memory/Recall Ability : Current season;Location of own room;That he or she is in a hospital/hospital unit      Short Term Goals: Week 1: SLP Short Term Goal 1 (Week 1): Patient will demonstrate complex problem solving for functional and familiar tasks with supervision level verbal cues. SLP Short Term Goal 2 (Week 1): Patient will recall new, daily information with supervision level verbal cues for use of compensatory strategies.  Refer to Care Plan for Long Term Goals  Recommendations for other services: Neuropsych  Discharge Criteria: Patient will be discharged from SLP if patient refuses treatment 3 consecutive times without medical reason, if treatment goals not met, if there is a change in medical status, if patient makes no progress towards goals or if patient is discharged from hospital.  The above assessment, treatment plan, treatment alternatives and goals were discussed and mutually agreed upon: by patient  Jesselyn Rask 09/26/2022, 3:24 PM

## 2022-09-26 NOTE — Progress Notes (Signed)
PMR Admission Coordinator Pre-Admission Assessment  Patient: Ryan [name] is an 62 y.o., male MRN: [id number] DOB: 10/12/1960 Height: 5' 9" (175.3 cm) Weight: 81.3 kg              Insurance Information HMO: yes    PPO:      PCP:      IPA:      80/20:      OTHER:  PRIMARY: [facility]      Policy#: [id number]      Subscriber: pt CM Name: Deni Campbell      Phone#: not provided     Fax#: [phone] Pre-Cert#: [id number] auth for CIR via fax from Deni for admit 7/3 with updates due to fax listed above on 7/10      Employer:  Benefits:  Phone #: [phone]     Name:  Eff. Date: 08/23/22     Deduct: $0      Out of Pocket Max: $$3600 (met)      Life Max: n/a  CIR: $295/day for days 1-6      SNF: 20 full days Outpatient:      Co-Pay: $15/visit Home Health: 100%      Co-Pay:  DME: 80%     Co-Pay: 20% Providers:  SECONDARY:       Policy#:       Phone#:   Financial Counselor:       Phone#:   The "Data Collection Information Summary" for patients in Inpatient Rehabilitation Facilities with attached "Privacy Act Statement-Health Care Records" was provided and verbally reviewed with: Patient and Family  Emergency Contact Information Contact Information     Name Relation Home Work Mobile   Dabney,Brittany Daughter   [phone]   Munar,Jared Son   [phone]   Thompson,Lanise Niece   [phone]   Jackson,Ebony Daughter   [phone]      Current Medical History  Patient Admitting Diagnosis: discitis, bacteremia, endocarditis, septic emboli to brain/lung, cavitary PNA   History of Present Illness: Pt is a 62 y/o male with PMH of HTN, HLD, and ESRD (HD TRS), who admitted to [facility] on 09/07/22 with c/o back pain.  In ED tmax 102.5, SBP 161, WBC 23.9, Hgb 11.1, BUN 43, Creatinine 8.42 and he was admitted for sepsis criteria and started on broad spectrum abx.  Chest xray negative, CT showed multiple pulmonary nodules as well as some dilated loops of jejunum and  ileum concerning for possible obstruction.  MRI T/L spine ordered and showed indeterminate changes at L5-S1 suggestive of early discitis/osteomyelitis.  Blood cultures MSSA bacteremia.  ID consulted and felt bacteremia likely due to line infection and recommended TDC exchange, IV abx, and recurrent blood culture surveillance until negative.  Echo performed and showed probably vegetation.  TTE confirmed mitral valve endocarditis.  Pt with ongoing confusion and CT scan chest/brain showed septic emboli to both locations, as well as new airspace oacity in LUL consistent with cavitary PNA.  Pt unable to complete line exchange due to elevated WBC and became uremic.  TDC replaced on 7/1 and pt received additional HD for clearance.  He will be on IV cefazolin with HD treatments through 8/13.  Hospital course complicated by SBO, which has resolved.  Therapy ongoing and pt was recommended for CIR.   Complete NIHSS TOTAL: 3 Glasgow Coma Scale Score: 15  Patient's medical record from [facility] has been reviewed by the rehabilitation admission coordinator and physician.  Past Medical History  Past Medical History:  Diagnosis Date     Allergy    Anemia    Blood transfusion without reported diagnosis    Dialysis patient (HCC)    Tues,thurs,sat   ESRD (end stage renal disease) (HCC)    TTHS-    Family history of adverse reaction to anesthesia    father had allergic reaction with anesthesia with a dental procedure-(pt. doesn't know)   Hyperlipidemia    Hypertension     Has the patient had major surgery during 100 days prior to admission? No  Family History  family history includes Alcohol abuse in his maternal grandfather; Cancer in his father; Heart disease in his maternal grandmother; Heart disease (age of onset: 70) in his mother; Hypertension in his mother and sister; Kidney cancer in his father; Learning disabilities in his paternal grandmother; Mental illness in his paternal grandmother; Stroke in his  mother and paternal grandfather.   Current Medications   Current Facility-Administered Medications:    acetaminophen (TYLENOL) tablet 650 mg, 650 mg, Oral, Q6H PRN, 650 mg at 09/24/22 1312 **OR** acetaminophen (TYLENOL) suppository 650 mg, 650 mg, Rectal, Q6H PRN, Opyd, Timothy S, MD   atenolol (TENORMIN) tablet 12.5 mg, 12.5 mg, Oral, Daily, Chiu, Stephen K, MD, 12.5 mg at 09/24/22 0829   calcitRIOL (ROCALTROL) capsule 0.5 mcg, 0.5 mcg, Oral, Q T,Th,Sat-1800, Ejigiri, Ogechi Grace, PA-C, 0.5 mcg at 09/20/22 1745   camphor-menthol (SARNA) lotion, , Topical, PRN, Chiu, Stephen K, MD, Given at 09/22/22 1207   ceFAZolin (ANCEF) IVPB 2g/100 mL premix, 2 g, Intravenous, Q T,Th,Sat-1800, Martin, Elizabeth J, RPH, Last Rate: 200 mL/hr at 09/20/22 1902, 2 g at 09/20/22 1902   Chlorhexidine Gluconate Cloth 2 % PADS 6 each, 6 each, Topical, Q0600, Brown, Rita Harbison, NP, 6 each at 09/24/22 1123   chlorproMAZINE (THORAZINE) tablet 25 mg, 25 mg, Oral, TID PRN, Chiu, Stephen K, MD, 25 mg at 09/19/22 1525   cinacalcet (SENSIPAR) tablet 30 mg, 30 mg, Oral, Q supper, Yates, Jennifer, MD, 30 mg at 09/24/22 1751   Darbepoetin Alfa (ARANESP) injection 100 mcg, 100 mcg, Subcutaneous, Q Thu-1800, Stovall, Kathryn R, PA-C   feeding supplement (NEPRO CARB STEADY) liquid 237 mL, 237 mL, Oral, BID BM, Nettey, Ralph A, MD, 237 mL at 09/24/22 1458   guaiFENesin (ROBITUSSIN) 100 MG/5ML liquid 5 mL, 5 mL, Oral, Q4H PRN, Yates, Jennifer, MD, 5 mL at 09/19/22 1129   heparin injection 4,000 Units, 4,000 Units, Dialysis, Once in dialysis, Stovall, Kathryn R, PA-C   HYDROmorphone (DILAUDID) injection 0.5-1 mg, 0.5-1 mg, Intravenous, Q3H PRN, Nettey, Ralph A, MD, 1 mg at 09/22/22 0544   lip balm (CARMEX) ointment, , Topical, PRN, Feliz Ortiz, Abraham, MD   melatonin tablet 3 mg, 3 mg, Oral, QHS PRN, Opyd, Timothy S, MD, 3 mg at 09/24/22 0207   methocarbamol (ROBAXIN) 500 mg in dextrose 5 % 50 mL IVPB, 500 mg, Intravenous, Q6H  PRN, Yates, Jennifer, MD, Last Rate: 110 mL/hr at 09/20/22 1600, Infusion Verify at 09/20/22 1600   multivitamin (RENA-VIT) tablet 1 tablet, 1 tablet, Oral, QHS, Yates, Jennifer, MD, 1 tablet at 09/24/22 2226   ondansetron (ZOFRAN) tablet 4 mg, 4 mg, Oral, Q6H PRN, 4 mg at 09/15/22 1158 **OR** ondansetron (ZOFRAN) injection 4 mg, 4 mg, Intravenous, Q6H PRN, Opyd, Timothy S, MD   Oral care mouth rinse, 15 mL, Mouth Rinse, PRN, Chiu, Stephen K, MD   oxyCODONE (Oxy IR/ROXICODONE) immediate release tablet 5-10 mg, 5-10 mg, Oral, Q4H PRN, Nettey, Ralph A, MD, 10 mg at 09/24/22 1800   pantoprazole (PROTONIX)   EC tablet 40 mg, 40 mg, Oral, Daily, Upton, Elizabeth, MD, 40 mg at 09/24/22 0829   phenol (CHLORASEPTIC) mouth spray 1 spray, 1 spray, Mouth/Throat, PRN, Chiu, Stephen K, MD, 1 spray at 09/20/22 2117   senna (SENOKOT) tablet 8.6 mg, 1 tablet, Oral, Daily PRN, Opyd, Timothy S, MD, 8.6 mg at 09/20/22 1354   sodium chloride flush (NS) 0.9 % injection 3 mL, 3 mL, Intravenous, Q12H, Opyd, Timothy S, MD, 3 mL at 09/24/22 2227   sucroferric oxyhydroxide (VELPHORO) chewable tablet 1,000 mg, 1,000 mg, Oral, TID WC, Stovall, Kathryn R, PA-C, 1,000 mg at 09/24/22 1750  Patients Current Diet:  Diet Order             Diet - low sodium heart healthy           Diet regular Room service appropriate? Yes; Fluid consistency: Thin; Fluid restriction: 1200 mL Fluid  Diet effective now                   Precautions / Restrictions Precautions Precautions: Fall Restrictions Weight Bearing Restrictions: No   Has the patient had 2 or more falls or a fall with injury in the past year?No  Prior Activity Level Community (5-7x/wk): indep without DME, driving, went out daily to get out of the house  Prior Functional Level Prior Function Prior Level of Function : Independent/Modified Independent, Driving Mobility Comments: Previously Independent without an AD ADLs Comments: Previously Independent to Mod I  with pt stating he does not wear socks due to difficulty donning/doffing them and socks feet instead of washing secondary to pain in leaning forward to wash feet.  Self Care: Did the patient need help bathing, dressing, using the toilet or eating?  Independent  Indoor Mobility: Did the patient need assistance with walking from room to room (with or without device)? Independent  Stairs: Did the patient need assistance with internal or external stairs (with or without device)? Independent  Functional Cognition: Did the patient need help planning regular tasks such as shopping or remembering to take medications? Independent  Patient Information Are you of Hispanic, Latino/a,or Spanish origin?: A. No, not of Hispanic, Latino/a, or Spanish origin What is your race?: B. Black or African American Do you need or want an interpreter to communicate with a doctor or health care staff?: 0. No  Patient's Response To:  Health Literacy and Transportation Is the patient able to respond to health literacy and transportation needs?: Yes Health Literacy - How often do you need to have someone help you when you read instructions, pamphlets, or other written material from your doctor or pharmacy?: Never In the past 12 months, has lack of transportation kept you from medical appointments or from getting medications?: No In the past 12 months, has lack of transportation kept you from meetings, work, or from getting things needed for daily living?: No  Home Assistive Devices / Equipment Home Assistive Devices/Equipment: None Home Equipment: None  Prior Device Use: Indicate devices/aids used by the patient prior to current illness, exacerbation or injury? None of the above  Current Functional Level Cognition  Arousal/Alertness: Awake/alert Overall Cognitive Status: Within Functional Limits for tasks assessed Difficult to assess due to: Impaired communication (word and thought difficulties) Current Attention  Level: Sustained Orientation Level: Oriented X4 Following Commands: Follows one step commands with increased time Safety/Judgement: Decreased awareness of deficits, Decreased awareness of safety General Comments: Pt with increased difficulty with word finding and difficulty answering questions requiring increased processing (including How   many quarters are there in $2.75?). This is new for pt. Pt previously worked in finance and in previous OT sesisons this admission demonstrated cognition WNL including ability to quickly process tasks/questions requiring higher level cognition. Attention:  (intermittently distracted internally but able to redirect independently) Memory: Appears intact Awareness: Appears intact Problem Solving: Appears intact Executive Function: Reasoning Reasoning: Impaired Reasoning Impairment: Functional complex Safety/Judgment: Appears intact    Extremity Assessment (includes Sensation/Coordination)  Upper Extremity Assessment: Overall WFL for tasks assessed RUE Deficits / Details: Pt reports hx of R rotator cuff tear with subsequent weakness and decreased AROM/PROM in Right shoulder. Strength 3+ tp 4-/5 RUE Sensation: WNL RUE Coordination: WNL LUE Deficits / Details: Pt presents with general L UE MMT 3+ to 4-/5 and AROM shoulder flexion to approx. 105 degrees. LUE Sensation: WNL LUE Coordination: WNL  Lower Extremity Assessment: Defer to PT evaluation    ADLs  Overall ADL's : Needs assistance/impaired Eating/Feeding: Modified independent, Bed level Grooming: Modified independent, Bed level, Cueing for sequencing Upper Body Bathing: Moderate assistance, Sitting, Cueing for compensatory techniques, Cueing for sequencing Lower Body Bathing: Maximal assistance, Sit to/from stand, Cueing for compensatory techniques, Cueing for sequencing Upper Body Dressing : Minimal assistance, Cueing for compensatory techniques, Sitting, Cueing for sequencing Lower Body Dressing:  Maximal assistance, Sit to/from stand, Cueing for sequencing, Cueing for compensatory techniques Lower Body Dressing Details (indicate cue type and reason): Pt able to donn socks while laying in bed with HOB up and crossing his legs in a "figure 4" pattern to get them on --it was  stretch for him but he was able to do it. Pt able to donn pj pants sitting in recliner with min-mod A sit<>stand Toilet Transfer: Moderate assistance, Cueing for sequencing, Cueing for safety, Stand-pivot, BSC/3in1, Rolling walker (2 wheels) Toilet Transfer Details (indicate cue type and reason): Mod A sit<>stand with min A to take 3 steps to turn to recliner with RW. Toileting- Clothing Manipulation and Hygiene: Moderate assistance, Cueing for sequencing, Cueing for compensatory techniques, Sitting/lateral lean, Sit to/from stand Toileting - Clothing Manipulation Details (indicate cue type and reason): Mod A sit<>stand General ADL Comments: OT educated pt in use of AE for increased safety and independence with ADLs and to assist in preventing pain exacerbation with bending and twisting. Pt celined sitting EOB this session secondary to pain and fatigue. Pt demonstrated understanding of AE in simulated tasks from bed level and/or teach back with pt stating he is agreeable to plan of trialling AE sitting EOB next OT session. OT also answered pt's general questions regarding available DME/AE for increased safety and independence in the bath/shower post acute discharge.    Mobility  Overal bed mobility: Needs Assistance Bed Mobility: Rolling, Sidelying to Sit Rolling: Min guard Sidelying to sit: Min guard Sit to supine: Mod assist General bed mobility comments: Pt beginning and ending session in recliner    Transfers  Overall transfer level: Needs assistance Equipment used: Rolling walker (2 wheels) Transfers: Sit to/from Stand Sit to Stand: Min guard Bed to/from chair/wheelchair/BSC transfer type:: Step pivot Step pivot  transfers: Min assist General transfer comment: From recliner with min guard for safety and cues for hand placement    Ambulation / Gait / Stairs / Wheelchair Mobility  Ambulation/Gait Ambulation/Gait assistance: Min guard Gait Distance (Feet): 65 Feet Assistive device: Rolling walker (2 wheels) Gait Pattern/deviations: Step-through pattern, Trunk flexed General Gait Details: Pt demonstrating a slow step-through pattern with cues for upright posture. 2 standing rest breaks due to   fatigue. Gait velocity: reduced Gait velocity interpretation: <1.31 ft/sec, indicative of household ambulator    Posture / Balance Dynamic Sitting Balance Sitting balance - Comments: sitting in recliner Balance Overall balance assessment: Needs assistance Sitting-balance support: No upper extremity supported Sitting balance-Leahy Scale: Fair Sitting balance - Comments: sitting in recliner Standing balance support: Bilateral upper extremity supported, Reliant on assistive device for balance, During functional activity Standing balance-Leahy Scale: Fair Standing balance comment: with RW support. Pt able to static stand without UE with no LOB    Special needs/care consideration Continuous Drip IV  cefazolin, Dialysis: Hemodialysis Tuesday, Thursday, and Saturday, and Diabetic management yes     Previous Home Environment (from acute therapy documentation) Living Arrangements: Alone  Lives With: Alone Available Help at Discharge: Family, Available 24 hours/day Type of Home: House Home Layout: Two level, 1/2 bath on main level Alternate Level Stairs-Number of Steps: flight Home Access: Stairs to enter Entrance Stairs-Number of Steps: 1 Bathroom Shower/Tub: Walk-in shower Home Care Services: No Additional Comments: has already made plans to stay on the main level until he makes a full recovery  Discharge Living Setting Plans for Discharge Living Setting: Patient's home (family to come and stay at discharge  until safe to be indep again) Type of Home at Discharge: House Discharge Home Layout: Two level, Bed/bath upstairs, 1/2 bath on main level Alternate Level Stairs-Number of Steps: full flight Discharge Home Access: Stairs to enter Entrance Stairs-Rails: None Entrance Stairs-Number of Steps: 1-2 Discharge Bathroom Shower/Tub: Tub/shower unit Discharge Bathroom Toilet: Standard Discharge Bathroom Accessibility: Yes How Accessible: Accessible via walker Does the patient have any problems obtaining your medications?: No  Social/Family/Support Systems Anticipated Caregiver: multiple family members, main contact is Ebony (daughter), can also call Brittany (dtr), or Lanise (niece) Anticipated Caregiver's Contact Information: Ebony [phone]; Brittany [phone]; Lanise [phone] Ability/Limitations of Caregiver: none stated Caregiver Availability: 24/7 Discharge Plan Discussed with Primary Caregiver: Yes Is Caregiver In Agreement with Plan?: Yes Does Caregiver/Family have Issues with Lodging/Transportation while Pt is in Rehab?: No   Goals Patient/Family Goal for Rehab: PT/OT/SLP supervision to mod I Expected length of stay: 10-14 days Additional Information: Discharge plan: return to pt's home, family will provide assist initially at discharge Pt/Family Agrees to Admission and willing to participate: Yes Program Orientation Provided & Reviewed with Pt/Caregiver Including Roles  & Responsibilities: Yes  Barriers to Discharge: Insurance for SNF coverage, Home environment access/layout   Decrease burden of Care through IP rehab admission: n/a   Possible need for SNF placement upon discharge: Not anticipated.  Pt improving well now that HD resumed.  Plan for home to pt's house with family providing assist if he's not able to be mod I.    Patient Condition: This patient's medical and functional status has changed since the consult dated: 09/16/22 in which the Rehabilitation Physician  determined and documented that the patient's condition is appropriate for intensive rehabilitative care in an inpatient rehabilitation facility. See "History of Present Illness" (above) for medical update. Functional changes are: min assist/min guard with mobility and ADLs. Patient's medical and functional status update has been discussed with the Rehabilitation physician and patient remains appropriate for inpatient rehabilitation. Will admit to inpatient rehab today.  Preadmission Screen Completed By:  Bellami Farrelly E Daivd Fredericksen, PT, 09/25/2022 10:08 AM ______________________________________________________________________   Discussed status with Dr. Engler on 09/25/22 at 10:23 AM  and received approval for admission today.  Admission Coordinator:  Zuleima Haser E Necole Minassian, time 10:23 AM /Date07/04/24    

## 2022-09-26 NOTE — Evaluation (Signed)
Occupational Therapy Assessment and Plan  Patient Details  Name: Ryan [name] MRN: [id number] Date of Birth: [birth year]  OT Diagnosis: abnormal posture and muscle weakness (generalized) Rehab Potential: Rehab Potential (ACUTE ONLY): Good ELOS:     Today's Date: 09/26/2022 OT Individual Time: 3016-0109 OT Individual Time Calculation (min): 75 min     Hospital Problem: Principal Problem:   Diskitis   Past Medical History:  Past Medical History:  Diagnosis Date   Allergy    Anemia    Blood transfusion without reported diagnosis    Dialysis patient (HCC)    Tues,thurs,sat   ESRD (end stage renal disease) (HCC)    TTHS-    Family history of adverse reaction to anesthesia    father had allergic reaction with anesthesia with a dental procedure-(pt. doesn't know)   Hyperlipidemia    Hypertension    Past Surgical History:  Past Surgical History:  Procedure Laterality Date   A/V FISTULAGRAM Left 12/15/2016   Procedure: A/V Fistulagram - left;  Surgeon: Chuck Hint, MD;  Location: [facility] INVASIVE CV LAB;  Service: Cardiovascular;  Laterality: Left;   AV FISTULA PLACEMENT Left 08/28/2016   Procedure: LEFT UPPER  ARM ARTERIOVENOUS (AV) FISTULA CREATION;  Surgeon: Chuck Hint, MD;  Location: [facility] OR;  Service: Vascular;  Laterality: Left;   DIALYSIS/PERMA CATHETER INSERTION Right 12/21/2017   Procedure: INSERTION OF DIALYSIS CATHETER Right Internal Jugular .;  Surgeon: Sherren Kerns, MD;  Location: [facility] OR;  Service: Vascular;  Laterality: Right;   FISTULA SUPERFICIALIZATION Left 09/23/2017   Procedure: FISTULA PLICATION LEFT ARM;  Surgeon: Sherren Kerns, MD;  Location: [facility] OR;  Service: Vascular;  Laterality: Left;   FISTULOGRAM Left 12/21/2017   Procedure: FISTULOGRAM with Balloon Angioplasty.;  Surgeon: Sherren Kerns, MD;  Location: [facility] OR;  Service: Vascular;  Laterality: Left;   HEMATOMA EVACUATION Left 08/27/2020   Procedure: EVACUATION HEMATOMA LEFT  ARM;  Surgeon: Sherren Kerns, MD;  Location: [facility] OR;  Service: Vascular;  Laterality: Left;   IR FLUORO GUIDE CV LINE LEFT  09/22/2022   IR FLUORO GUIDE CV LINE RIGHT  09/12/2022   IR FLUORO GUIDE CV LINE RIGHT  09/17/2022   IR REMOVAL TUN CV CATH W/O FL  09/09/2022   IR THORACENTESIS ASP PLEURAL SPACE W/IMG GUIDE  08/22/2017   1.2 L -right-sided   IR THORACENTESIS ASP PLEURAL SPACE W/IMG GUIDE  10/19/2017   IR US GUIDE VASC ACCESS LEFT  09/22/2022   IR US GUIDE VASC ACCESS RIGHT  09/12/2022   KIDNEY TRANSPLANT     REVISON OF ARTERIOVENOUS FISTULA Left 12/21/2017   Procedure: PLICATION and Ligation of F LEFT ARM ARTERIOVENOUS FISTULA;  Surgeon: Sherren Kerns, MD;  Location: [facility] OR;  Service: Vascular;  Laterality: Left;   REVISON OF ARTERIOVENOUS FISTULA Left 07/16/2020   Procedure: EXCISION OF LEFT ARM ARTERIOVENOUS FISTULA;  Surgeon: Sherren Kerns, MD;  Location: [facility] OR;  Service: Vascular;  Laterality: Left;   stent in kidneys     dec 2017   TRANSTHORACIC ECHOCARDIOGRAM  09/22/2017    Severe LVH.  Normal function -EF 55-60%.  GRII DD.  Moderate RV dilation with mildly reduced RV function.   Fixed right coronary cusp with very mild aortic stenosis.  The myocardium has a speckled appearance --> .   Recommend cardiac MRI to evaluate for amyloid   UPPER EXTREMITY VENOGRAPHY Bilateral 06/01/2020   Procedure: UPPER EXTREMITY VENOGRAPHY;  Surgeon: Chuck Hint, MD;  Location: [facility] INVASIVE  CV LAB;  Service: Cardiovascular;  Laterality: Bilateral;    Assessment & Plan Clinical Impression: [name] is a 62 year old RH male with history of  HTN, ESRD who was admitted on 09/07/22 with  severe back pain with spasms, cough, bowel incontinence and RLE weakness. He was noted to be febrile with T-102.5 and WBC 23,000 with reports of rhinorrhea and sore throat but Covid negative.  MRI thoracolumbar spine showed degenerative changes at L5-S1 with edema and concerns of early discitis  with osteomyelitis.  Blood cultures positive for MSSA and Dr. Thedore Mins recommended broad spectrum antibiotics as well as TTE with repeat BC and removal of right internal jugular as this was felt to be a cause of infection.  He continued to have leucocytosis as well as question of constipation. Repeat CT abdomen pelvis showed SBO  with transition in right anterior abdomen area of mid bowel wall thickening as well as multiple new pulmonary nodules in RLL up to 17 mm. General surgery consulted and questioned infections v/s inflammatory enteritis v/s neoplastic infiltration but due to due to issues with diarrhea felt to be infectious in nature and did not feel NGT needed as patient without GI distress. Lungs nodules felt to be septic in  nature as patient without signs of PNA. Intractable hiccups managed with use of thorazine.    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 Rennis Golden due to concerns of anesthesia and risks outweighed benefits. On 06/27, family reported slurred speech as well as mental status changes and MRI brain done showing scattered subcentimeter ischemic infarcts in bilateral cerebral hemispheres and right cerebellum as well as subtle evolving infarct in right ventral pons. EEG was positive for moderate diffuse encephalopathy but was negative for seizures. Antibiotics changed to Cefazolin on 06/25 for 8 weeks with EOT 11/04/22.  He did develop respiratory distress on 07/01 requiring BIPAP secondary to fluid overload with hyperkalemia due to IV access issues. L-internal jugular placed and underwent HD X 2 days with improvement in symptoms and now back to TTS schedule. He continues on 2 L oxygen per [place]. Diet liberalized and multiple supplements added by RD due to concerns of intake and moderate malnutrition.  Mentation has improved but continues to endorse delayed processing  as well as problems with memory.  He did develop new petechial lesions bilateral feet overnight which are non tender. Therapy has been working with patient who requires min assist and multiple rest breaks as well as use of AD with mobility due to fatigue and debility. Also reporting lower back pain with spasms.    He lives alone, was driving and independent without AD prior to admission. Family to assist after discharge. CIR recommended due to functional decline. .  Patient transferred to CIR on 09/25/2022 .    Patient currently requires mod with basic self-care skills secondary to muscle weakness, decreased cardiorespiratoy endurance, and decreased sitting balance, decreased standing balance, and decreased balance strategies.  Prior to hospitalization, patient could complete ADLs and IADLs with independent .  Patient will benefit from skilled intervention to decrease level of assist with basic self-care skills, increase independence with basic self-care skills, and increase level of independence with iADL prior to discharge home independently.  Anticipate patient will require intermittent supervision and follow up home health.  OT - End of Session Activity Tolerance: Tolerates 30+ min activity with multiple  rests Endurance Deficit: Yes OT Assessment Rehab Potential (ACUTE ONLY): Good OT Barriers to Discharge: Inaccessible home environment;Decreased caregiver support;Hemodialysis OT Barriers to Discharge Comments: lives alone, although family able to assist OT Patient demonstrates impairments in the following area(s): Balance;Endurance;Motor OT Basic ADL's Functional Problem(s): Eating;Grooming;Bathing;Dressing;Toileting OT Advanced ADL's Functional Problem(s): Simple Meal Preparation OT Transfers Functional Problem(s): Toilet;Tub/Shower OT Plan OT Intensity: Minimum of 1-2 x/day, 45 to 90 minutes OT Frequency: 5 out of 7 days OT duration: 10-14 days OT Treatment/Interventions:  Balance/vestibular training;Discharge planning;Self Care/advanced ADL retraining;Therapeutic Activities;UE/LE Coordination activities;Functional mobility training;Patient/family education;Therapeutic Exercise;Community reintegration;DME/adaptive equipment instruction;Neuromuscular re-education;UE/LE Strength taining/ROM OT Self Feeding Anticipated Outcome(s): Mod I OT Basic Self-Care Anticipated Outcome(s): Mod I OT Toileting Anticipated Outcome(s): Mod I OT Bathroom Transfers Anticipated Outcome(s): mod I OT Recommendation Recommendations for Other Services: Therapeutic Recreation consult Therapeutic Recreation Interventions: Pet therapy;Outing/community reintergration Patient destination: Home Follow Up Recommendations: Home health OT Equipment Recommended: 3 in 1 bedside comode;To be determined;Rolling walker with 5" wheels;Tub/shower bench   OT Evaluation Precautions/Restrictions  Precautions Precautions: Fall Restrictions Weight Bearing Restrictions: No General Chart Reviewed: Yes Family/Caregiver Present: No Pain Pain Assessment Pain Scale: 0-10 Pain Score: 4  Pain Location: Back Pain Orientation: Lower Home Living/Prior Functioning Home Living Family/patient expects to be discharged to:: Private residence Living Arrangements: Alone Available Help at Discharge: Family, Available 24 hours/day Type of Home: House (town house) Home Access: Stairs to enter [employer] of Steps: 1 from sidewalk leading to porch then 1 STE going in from [employer]: None Home Layout: Two level, 1/2 bath on main level, Bed/bath upstairs Alternate Level Stairs-Number of Steps: flight Alternate Level Stairs-Rails: Right Bathroom Shower/Tub: Engineer, manufacturing systems: Standard Bathroom Accessibility: Yes Additional Comments: has already made plans to stay on the main level until he makes a full recovery  Lives With: Alone IADL History Homemaking  Responsibilities: Yes Meal Prep Responsibility: Primary Laundry Responsibility: Primary Cleaning Responsibility: Primary Bill Paying/Finance Responsibility: Primary Current License: Yes Mode of Transportation: Car Occupation: Part time employment (Building surveyor) Prior Function Level of Independence: Independent with basic ADLs, Independent with homemaking with ambulation, Independent with gait  Able to Take Stairs?: Yes Driving: Yes Vocation: Part time employment Leisure: Hobbies-yes (Comment) (church, bible study, family oriented) Vision Baseline Vision/History: 1 Wears glasses (wears all the time) Ability to See in Adequate Light: 1 Impaired Patient Visual Report: No change from baseline Vision Assessment?: Yes Eye Alignment: Within Functional Limits Ocular Range of Motion: Within Functional Limits Tracking/Visual Pursuits: Able to track stimulus in all quads without difficulty Saccades: Within functional limits Convergence: Impaired - to be further tested in functional context (eyes do not converge) Visual Fields: No apparent deficits Perception  Perception: Within Functional Limits Praxis Praxis: Impaired Praxis Impairment Details: Initiation;Motor planning Praxis-Other Comments: Pt with noted initiation and motor planning/coordination deficits Cognition Cognition Overall Cognitive Status: Within Functional Limits for tasks assessed Arousal/Alertness: Awake/alert Orientation Level: Person;Place;Situation Person: Oriented Place: Oriented Situation: Oriented Memory: Appears intact Awareness: Appears intact Problem Solving: Appears intact (increased time for problem solving) Reasoning: Impaired Reasoning Impairment: Functional complex Safety/Judgment: Appears intact (initiated locking W/C, safe sit to stand hand placement) Brief Interview for Mental Status (BIMS) Repetition of Three Words (First Attempt): 3 Temporal Orientation: Year: Correct Temporal  Orientation: Month: Accurate within 5 days Temporal Orientation: Day: Correct Recall: "Sock": Yes, no cue required Recall: "Blue": Yes, no cue required Recall: "Bed": Yes, no cue required BIMS Summary Score: 15 Sensation Sensation Light Touch: Appears Intact (reports numbness/tingling in  Lt hand) Hot/Cold: Appears Intact Proprioception: Appears Intact Stereognosis: Not tested Coordination Gross Motor Movements are Fluid and Coordinated: No Fine Motor Movements are Fluid and Coordinated: No Coordination and Movement Description: mild incoordination, potential ataxia Motor  Motor Motor: Other (comment) (mild incoordination, potential ataxia)  Trunk/Postural Assessment  Cervical Assessment Cervical Assessment: Within Functional Limits Thoracic Assessment Thoracic Assessment: Exceptions to [facility] (rounded shoulders) Lumbar Assessment Lumbar Assessment: Exceptions to [facility] (posterior pelvic tilt) Postural Control Postural Control: Within Functional Limits  Balance Balance Balance Assessed: Yes Dynamic Sitting Balance Sitting balance - Comments: seated EOB reaching down for LB dressing Extremity/Trunk Assessment RUE Assessment RUE Assessment: Exceptions to [facility] Active Range of Motion (AROM) Comments: ~100 shoulder flexion, WFL distal General Strength Comments: 3+/5 proximal, 4-/4 distal LUE Assessment LUE Assessment: Exceptions to [facility] Active Range of Motion (AROM) Comments: ~120 shoulder flexion, WFL distal General Strength Comments: 3+/5 proximal, 4-/4 distal  Care Tool Care Tool Self Care Eating   Eating Assist Level: Set up assist    Oral Care    Oral Care Assist Level: Set up assist    Bathing         Assist Level: Moderate Assistance - Patient 50 - 74%    Upper Body Dressing(including orthotics)       Assist Level: Minimal Assistance - Patient > 75%    Lower Body Dressing (excluding footwear)     Assist for lower body dressing: Moderate Assistance - Patient 50  - 74%    Putting on/Taking off footwear     Assist for footwear: Total Assistance - Patient < 25%       Care Tool Toileting Toileting activity   Assist for toileting: Minimal Assistance - Patient > 75%     Care Tool Bed Mobility Roll left and right activity   Roll left and right assist level: Moderate Assistance - Patient 50 - 74%    Sit to lying activity   Sit to lying assist level: Minimal Assistance - Patient > 75%    Lying to sitting on side of bed activity   Lying to sitting on side of bed assist level: the ability to move from lying on the back to sitting on the side of the bed with no back support.: Minimal Assistance - Patient > 75%     Care Tool Transfers Sit to stand transfer   Sit to stand assist level: Minimal Assistance - Patient > 75%    Chair/bed transfer   Chair/bed transfer assist level: Minimal Assistance - Patient > 75%     Toilet transfer   Assist Level: Minimal Assistance - Patient > 75%     Care Tool Cognition  Expression of Ideas and Wants Expression of Ideas and Wants: 4. Without difficulty (complex and basic) - expresses complex messages without difficulty and with speech that is clear and easy to understand  Understanding Verbal and Non-Verbal Content Understanding Verbal and Non-Verbal Content: 4. Understands (complex and basic) - clear comprehension without cues or repetitions   Memory/Recall Ability Memory/Recall Ability : Current season;Location of own room;That he or she is in a hospital/hospital unit   Refer to Care Plan for Long Term Goals  SHORT TERM GOAL WEEK 1 OT Short Term Goal 1 (Week 1): Pt will complete bathing at [facility] in shower OT Short Term Goal 2 (Week 1): Pt will complete AE training to demonstrate LB dressing CGA OT Short Term Goal 3 (Week 1): Pt will complete standing activity with tolerance >5 minutes  Recommendations for other  services: Therapeutic Recreation  Pet therapy and Outing/community reintegration   Skilled  Therapeutic Intervention ADL ADL Eating: Set up Where Assessed-Eating: Bed level Grooming: Setup Where Assessed-Grooming: Sitting at sink Upper Body Bathing: Minimal assistance Where Assessed-Upper Body Bathing: Sitting at sink Lower Body Bathing: Moderate assistance Where Assessed-Lower Body Bathing: Standing at sink;Sitting at sink Upper Body Dressing: Minimal assistance Where Assessed-Upper Body Dressing: Sitting at sink Lower Body Dressing: Moderate assistance Where Assessed-Lower Body Dressing: Sitting at sink;Standing at sink ADL Comments: would benefit from AE training Mobility  Bed Mobility Bed Mobility: Rolling Right;Rolling Left Rolling Right: Moderate Assistance - Patient 50-74% Rolling Left: Moderate Assistance - Patient 50-74% Transfers Sit to Stand: Minimal Assistance - Patient > 75% Stand to Sit: Minimal Assistance - Patient > 75%   Therapy Session "This was great!" Pt supine in bed upon OT arrival, agreeable to OT session.  Pt seen for initiation of 1:1 OT evaluation and treatment. Pt educated on roles, goals, purpose of OT and anticipated therapy schedule. Pt receptive to education. Pt completed ADL this date with level of assist listed above. Pt educated on AE equipment (reacher/sock aide) for future use in order to increase independence with ADLs such as LB dressing. Pt will benefit from skilled OT in order to maximize independence with ADLs and IADLs in order for safe D/C home.   Pt seated in W/C at end of session with W/C alarm donned, call light within reach and 4Ps assessed. Handoff to Nutritional therapist.   Discharge Criteria: Patient will be discharged from OT if patient refuses treatment 3 consecutive times without medical reason, if treatment goals not met, if there is a change in medical status, if patient makes no progress towards goals or if patient is discharged from hospital.  The above assessment, treatment plan, treatment alternatives and goals  were discussed and mutually agreed upon: by patient Velia Meyer, OTD, OTR/L 09/26/2022, 10:28 AM

## 2022-09-26 NOTE — Progress Notes (Signed)
Inpatient Rehabilitation Care Coordinator Assessment and Plan Patient Details  Name: Ryan [name] MRN: [id number] Date of Birth: 1960/07/01  Today's Date: 09/26/2022  Hospital Problems: Principal Problem:   Diskitis  Past Medical History:  Past Medical History:  Diagnosis Date   Allergy    Anemia    Blood transfusion without reported diagnosis    Dialysis patient (HCC)    Tues,thurs,sat   ESRD (end stage renal disease) (HCC)    TTHS-    Family history of adverse reaction to anesthesia    father had allergic reaction with anesthesia with a dental procedure-(pt. doesn't know)   Hyperlipidemia    Hypertension    Past Surgical History:  Past Surgical History:  Procedure Laterality Date   A/V FISTULAGRAM Left 12/15/2016   Procedure: A/V Fistulagram - left;  Surgeon: Chuck Hint, MD;  Location: [facility] INVASIVE CV LAB;  Service: Cardiovascular;  Laterality: Left;   AV FISTULA PLACEMENT Left 08/28/2016   Procedure: LEFT UPPER  ARM ARTERIOVENOUS (AV) FISTULA CREATION;  Surgeon: Chuck Hint, MD;  Location: [facility] OR;  Service: Vascular;  Laterality: Left;   DIALYSIS/PERMA CATHETER INSERTION Right 12/21/2017   Procedure: INSERTION OF DIALYSIS CATHETER Right Internal Jugular .;  Surgeon: Sherren Kerns, MD;  Location: [facility] OR;  Service: Vascular;  Laterality: Right;   FISTULA SUPERFICIALIZATION Left 09/23/2017   Procedure: FISTULA PLICATION LEFT ARM;  Surgeon: Sherren Kerns, MD;  Location: [facility] OR;  Service: Vascular;  Laterality: Left;   FISTULOGRAM Left 12/21/2017   Procedure: FISTULOGRAM with Balloon Angioplasty.;  Surgeon: Sherren Kerns, MD;  Location: [facility] OR;  Service: Vascular;  Laterality: Left;   HEMATOMA EVACUATION Left 08/27/2020   Procedure: EVACUATION HEMATOMA LEFT ARM;  Surgeon: Sherren Kerns, MD;  Location: [facility] OR;  Service: Vascular;  Laterality: Left;   IR FLUORO GUIDE CV LINE LEFT  09/22/2022   IR FLUORO GUIDE CV LINE RIGHT  09/12/2022   IR  FLUORO GUIDE CV LINE RIGHT  09/17/2022   IR REMOVAL TUN CV CATH W/O FL  09/09/2022   IR THORACENTESIS ASP PLEURAL SPACE W/IMG GUIDE  08/22/2017   1.2 L -right-sided   IR THORACENTESIS ASP PLEURAL SPACE W/IMG GUIDE  10/19/2017   IR US GUIDE VASC ACCESS LEFT  09/22/2022   IR US GUIDE VASC ACCESS RIGHT  09/12/2022   KIDNEY TRANSPLANT     REVISON OF ARTERIOVENOUS FISTULA Left 12/21/2017   Procedure: PLICATION and Ligation of F LEFT ARM ARTERIOVENOUS FISTULA;  Surgeon: Sherren Kerns, MD;  Location: [facility] OR;  Service: Vascular;  Laterality: Left;   REVISON OF ARTERIOVENOUS FISTULA Left 07/16/2020   Procedure: EXCISION OF LEFT ARM ARTERIOVENOUS FISTULA;  Surgeon: Sherren Kerns, MD;  Location: [facility] OR;  Service: Vascular;  Laterality: Left;   stent in kidneys     dec 2017   TRANSTHORACIC ECHOCARDIOGRAM  09/22/2017    Severe LVH.  Normal function -EF 55-60%.  GRII DD.  Moderate RV dilation with mildly reduced RV function.   Fixed right coronary cusp with very mild aortic stenosis.  The myocardium has a speckled appearance --> .   Recommend cardiac MRI to evaluate for amyloid   UPPER EXTREMITY VENOGRAPHY Bilateral 06/01/2020   Procedure: UPPER EXTREMITY VENOGRAPHY;  Surgeon: Chuck Hint, MD;  Location: [facility] INVASIVE CV LAB;  Service: Cardiovascular;  Laterality: Bilateral;   Social History:  reports that he has never smoked. He has never used smokeless tobacco. He reports that he does not drink alcohol and  does not use drugs.  Family / Support Systems Marital Status: Divorced Patient Roles: Parent, Other (Comment) (friend and uncle) Children: Susy Frizzle [phone]  Dorena Dew [phone]  Ebony-daughter [phone] Other Supports: two nieces Anticipated Caregiver: Multiple family members-son, daughter and two nieces Ability/Limitations of Caregiver: Karel Jarvis has five children under the age of 19, Jilda Panda works on the weekends and lives around the corner from pt and two nieces work from  home Caregiver Availability: 24/7 Family Dynamics: Close knit family who will pull together to privide the care Dad will need. 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 to Work Plans: Hopefully plans to heal and get back to work Marine scientist Issues: No issues Guardian/Conservator: None-according to MD pt is capable of making his own decisions while here   Abuse/Neglect Abuse/Neglect Assessment Can Be Completed: Yes Physical Abuse: Denies Verbal Abuse: Denies Sexual Abuse: Denies Exploitation of patient/patient's resources: Denies Self-Neglect: Denies  Patient response to: Social Isolation - How often do you feel lonely or isolated from those around you?: Never  Emotional Status Pt's affect, behavior and adjustment status: Pt is motivated to do well and recover from this massive infection. He has always been independent and taken care of himself this is a set back for him. His plan is to be mod/i by discharge from rehab Recent Psychosocial Issues: other health issues-ESRD, HTN etc Psychiatric History: No history with all that has occureed he would benefit from seeing neuro-psych while here. Substance Abuse History: No issues  Patient / Family Perceptions, Expectations & Goals Pt/Family understanding of illness & functional limitations: Pt is able to explain his infection and the issues he has a result-of this. He is feeling better now and feels ready to regain his strength. He does talk with the MD and feels he has a good understanding of his treatment plan moving  forward. Premorbid pt/family roles/activities: father, owner, employer, uncle, dialysis pt, home owner Anticipated changes in roles/activities/participation: resume Pt/family expectations/goals: Pt states: " I hope to be able to take care of myself and need people coming in and out only."  [employer]: Other (Comment) ([facility] Sat for OP-HD) Premorbid Home Care/DME Agencies: None Transportation available at discharge: self and children Is the patient able to respond to transportation needs?: Yes In the past 12 months, has lack of transportation kept you from medical appointments or from getting medications?: No In the past 12 months, has lack of transportation kept you from meetings, work, or from getting things needed for daily living?: No Resource referrals recommended: Neuropsychology  Discharge Planning Living Arrangements: Alone Support Systems: Children, Other relatives, Friends/neighbors Type of Residence: Private residence Insurance Resources: Media planner (specify) Administrator, sports Health) Financial Resources: Employment Financial Screen Referred: No Living Expenses: Banker Management: Patient Does the patient have any problems obtaining your medications?: No Home Management: self Patient/Family Preliminary Plans: Return home with children and nieces coming in and out to provide the care he will need. He was independent prior to admission and drove self to HD. Aware being evaluated today and goals being set for stay on rehab Care Coordinator Anticipated Follow Up Needs:  HH/OP  Clinical Impression Pleasant gentleman who is motivated to recover and regain his independence. His children are involved and will assist also with his niece's who work from home. Will await therapy evaluations and work on discharge needs. Supposedly getting IV antibiotics while in HD will confirm this.  He appears to be coping appropriately but would benefit from  seeing neuro-psych while here Jaylenn Baiza, Lemar Livings 09/26/2022, 11:00 AM

## 2022-09-26 NOTE — Plan of Care (Signed)
  Problem: RH Balance Goal: LTG Patient will maintain dynamic standing balance (PT) Description: LTG:  Patient will maintain dynamic standing balance with assistance during mobility activities (PT) Flowsheets (Taken 09/26/2022 1233) LTG: Pt will maintain dynamic standing balance during mobility activities with:: Independent with assistive device    Problem: Sit to Stand Goal: LTG:  Patient will perform sit to stand with assistance level (PT) Description: LTG:  Patient will perform sit to stand with assistance level (PT) Flowsheets (Taken 09/26/2022 1233) LTG: PT will perform sit to stand in preparation for functional mobility with assistance level: Independent with assistive device   Problem: RH Bed Mobility Goal: LTG Patient will perform bed mobility with assist (PT) Description: LTG: Patient will perform bed mobility with assistance, with/without cues (PT). Flowsheets (Taken 09/26/2022 1233) LTG: Pt will perform bed mobility with assistance level of: Independent   Problem: RH Bed to Chair Transfers Goal: LTG Patient will perform bed/chair transfers w/assist (PT) Description: LTG: Patient will perform bed to chair transfers with assistance (PT). Flowsheets (Taken 09/26/2022 1233) LTG: Pt will perform Bed to Chair Transfers with assistance level: Independent with assistive device    Problem: RH Car Transfers Goal: LTG Patient will perform car transfers with assist (PT) Description: LTG: Patient will perform car transfers with assistance (PT). Flowsheets (Taken 09/26/2022 1233) LTG: Pt will perform car transfers with assist:: Independent with assistive device    Problem: RH Ambulation Goal: LTG Patient will ambulate in controlled environment (PT) Description: LTG: Patient will ambulate in a controlled environment, # of feet with assistance (PT). Flowsheets (Taken 09/26/2022 1233) LTG: Pt will ambulate in controlled environ  assist needed:: Supervision/Verbal cueing LTG: Ambulation distance in  controlled environment: 300' Note: Community distance with LRAD Goal: LTG Patient will ambulate in home environment (PT) Description: LTG: Patient will ambulate in home environment, # of feet with assistance (PT). Flowsheets (Taken 09/26/2022 1233) LTG: Pt will ambulate in home environ  assist needed:: Independent with assistive device LTG: Ambulation distance in home environment: 36' Note: with LRAD   Problem: RH Stairs Goal: LTG Patient will ambulate up and down stairs w/assist (PT) Description: LTG: Patient will ambulate up and down # of stairs with assistance (PT) Flowsheets (Taken 09/26/2022 1233) LTG: Pt will ambulate up/down stairs assist needed:: Supervision/Verbal cueing LTG: Pt will  ambulate up and down number of stairs: 12 Note: With RHR

## 2022-09-26 NOTE — Progress Notes (Signed)
Inpatient Rehabilitation Center Individual Statement of Services  Patient Name:  Ryan [name]  Date:  09/26/2022  Welcome to the Inpatient Rehabilitation Center.  Our goal is to provide you with an individualized program based on your diagnosis and situation, designed to meet your specific needs.  With this comprehensive rehabilitation program, you will be expected to participate in at least 3 hours of rehabilitation therapies Monday-Friday, with modified therapy programming on the weekends.  Your rehabilitation program will include the following services:  Physical Therapy (PT), Occupational Therapy (OT), Speech Therapy (ST), 24 hour per day rehabilitation nursing, Therapeutic Recreaction (TR), Neuropsychology, Care Coordinator, Rehabilitation Medicine, Nutrition Services, and Pharmacy Services  Weekly team conferences will be held on Tuesday to discuss your progress.  Your Inpatient Rehabilitation Care Coordinator will talk with you frequently to get your input and to update you on team discussions.  Team conferences with you and your family in attendance may also be held.  Expected length of stay: 10-14 days  Overall anticipated outcome: supervision-mod/I level  Depending on your progress and recovery, your program may change. Your Inpatient Rehabilitation Care Coordinator will coordinate services and will keep you informed of any changes. Your Inpatient Rehabilitation Care Coordinator's name and contact numbers are listed  below.  The following services may also be recommended but are not provided by the Inpatient Rehabilitation Center:  Driving Evaluations Home Health Rehabiltiation Services Outpatient Rehabilitation Services Vocational Rehabilitation   Arrangements will be made to provide these services after discharge if needed.  Arrangements include referral to agencies that provide these services.  Your insurance has been verified to be:  [employer] Your primary doctor is:   Sharin Grave  Pertinent information will be shared with your doctor and your insurance company.  Inpatient Rehabilitation Care Coordinator:  Dossie Der, [place] [phone] or Luna Glasgow  Information discussed with and copy given to patient by: Lucy Chris, 09/26/2022, 11:04 AM

## 2022-09-26 NOTE — Progress Notes (Signed)
   09/26/22 2148  BiPAP/CPAP/SIPAP  Reason BIPAP/CPAP not in use Non-compliant

## 2022-09-26 NOTE — Plan of Care (Signed)
  Problem: RH Problem Solving Goal: LTG Patient will demonstrate problem solving for (SLP) Description: LTG:  Patient will demonstrate problem solving for basic/complex daily situations with cues  (SLP) Flowsheets (Taken 09/26/2022 1529) LTG: Patient will demonstrate problem solving for (SLP): Complex daily situations LTG Patient will demonstrate problem solving for: Modified Independent   Problem: RH Memory Goal: LTG Patient will demonstrate ability for day to day (SLP) Description: LTG:   Patient will demonstrate ability for day to day recall/carryover during cognitive/linguistic activities with assist  (SLP) Flowsheets (Taken 09/26/2022 1529) LTG: Patient will demonstrate ability for day to day recall: Daily complex information LTG: Patient will demonstrate ability for day to day recall/carryover during cognitive/linguistic activities with assist (SLP): Modified Independent Goal: LTG Patient will use memory compensatory aids to (SLP) Description: LTG:  Patient will use memory compensatory aids to recall biographical/new, daily complex information with cues (SLP) Flowsheets (Taken 09/26/2022 1529) LTG: Patient will use memory compensatory aids to (SLP): Modified Independent

## 2022-09-26 NOTE — Progress Notes (Signed)
Inpatient Rehabilitation  Patient information reviewed and entered into eRehab system by Hideo Googe M. Chyna Kneece, M.A., CCC/SLP, PPS Coordinator.  Information including medical coding, functional ability and quality indicators will be reviewed and updated through discharge.    

## 2022-09-26 NOTE — Plan of Care (Signed)
  Problem: RH Eating Goal: LTG Patient will perform eating w/assist, cues/equip (OT) Description: LTG: Patient will perform eating with assist, with/without cues using equipment (OT) Flowsheets (Taken 09/26/2022 1029) LTG: Pt will perform eating with assistance level of: Independent with assistive device    Problem: RH Grooming Goal: LTG Patient will perform grooming w/assist,cues/equip (OT) Description: LTG: Patient will perform grooming with assist, with/without cues using equipment (OT) Flowsheets (Taken 09/26/2022 1029) LTG: Pt will perform grooming with assistance level of: Independent with assistive device    Problem: RH Bathing Goal: LTG Patient will bathe all body parts with assist levels (OT) Description: LTG: Patient will bathe all body parts with assist levels (OT) Flowsheets (Taken 09/26/2022 1029) LTG: Pt will perform bathing with assistance level/cueing: Independent with assistive device    Problem: RH Dressing Goal: LTG Patient will perform upper body dressing (OT) Description: LTG Patient will perform upper body dressing with assist, with/without cues (OT). Flowsheets (Taken 09/26/2022 1029) LTG: Pt will perform upper body dressing with assistance level of: Independent with assistive device Goal: LTG Patient will perform lower body dressing w/assist (OT) Description: LTG: Patient will perform lower body dressing with assist, with/without cues in positioning using equipment (OT) Flowsheets (Taken 09/26/2022 1029) LTG: Pt will perform lower body dressing with assistance level of: Independent with assistive device   Problem: RH Toileting Goal: LTG Patient will perform toileting task (3/3 steps) with assistance level (OT) Description: LTG: Patient will perform toileting task (3/3 steps) with assistance level (OT)  Flowsheets (Taken 09/26/2022 1029) LTG: Pt will perform toileting task (3/3 steps) with assistance level: Independent with assistive device   Problem: RH Simple Meal  Prep Goal: LTG Patient will perform simple meal prep w/assist (OT) Description: LTG: Patient will perform simple meal prep with assistance, with/without cues (OT). Flowsheets (Taken 09/26/2022 1029) LTG: Pt will perform simple meal prep with assistance level of: Supervision/Verbal cueing   Problem: RH Toilet Transfers Goal: LTG Patient will perform toilet transfers w/assist (OT) Description: LTG: Patient will perform toilet transfers with assist, with/without cues using equipment (OT) Flowsheets (Taken 09/26/2022 1029) LTG: Pt will perform toilet transfers with assistance level of: Independent with assistive device   Problem: RH Tub/Shower Transfers Goal: LTG Patient will perform tub/shower transfers w/assist (OT) Description: LTG: Patient will perform tub/shower transfers with assist, with/without cues using equipment (OT) Flowsheets (Taken 09/26/2022 1029) LTG: Pt will perform tub/shower stall transfers with assistance level of: Independent with assistive device

## 2022-09-26 NOTE — Progress Notes (Signed)
PROGRESS NOTE   Subjective/Complaints:  O2 is new for him- didn't use at home Pain pretty uncontrolled- wondering if can increase meds at all? Didn't sleep well- couldn't sleep til ~ 3am- did get trazodone 25 mg last night, but didn't work.   Not clear if didn't sleep due to pain/new location?  Is anuric- on HD- ESRD.   LBM 2 days ago- doesn't feel constipated.   ROS:  Pt denies SOB, abd pain, CP, N/V/C/D, and vision changes Except for HPI Objective:   No results found. Recent Labs    09/24/22 0022  WBC 13.9*  HGB 8.0*  HCT 24.8*  PLT 315   Recent Labs    09/24/22 0022  NA 128*  K 4.2  CL 90*  CO2 26  GLUCOSE 133*  BUN 38*  CREATININE 5.05*  CALCIUM 8.3*    Intake/Output Summary (Last 24 hours) at 09/26/2022 1042 Last data filed at 09/25/2022 1816 Gross per 24 hour  Intake 236 ml  Output --  Net 236 ml        Physical Exam: Vital Signs Blood pressure (!) 92/59, pulse 79, temperature 98.3 F (36.8 C), resp. rate 18, height 5\' 11"  (1.803 m), weight 77.4 kg, SpO2 100 %.     General: awake, alert, appropriate, sitting up in bed- NAD HENT: conjugate gaze; oropharynx kind of dry- wearing 3L O2 by [place];  CV: regular rate and rhythm; no JVD Pulmonary: no W/R/R- but decreased throughout-  GI: soft, NT, ND, (+)BS- hypoactive Psychiatric: appropriate- interactive Neurological: Ox3 Skin: + L upper chest HD catheter - dressing c/d/i + Nonpainful erythematous lesions on bilateral feet  MSK:      No apparent deformity.      Strength: Exam limited d/t being on HD; all 4 limbs equal, antigravity and against resistance   Neurologic exam:  Cognition: AAO to person, place, time and event.  + Mild lethargy - intermittently falling asleep during late dialysis; more awake in early exam Language: Fluent, No substitutions or neoglisms. No dysarthria. Names 3/3 objects correctly.  Memory: Recalls 3/3 objects at 5  minutes. No apparent deficits  Insight: Good  insight into current condition.  Mood: Pleasant affect, appropriate mood.  Sensation: To light touch intact in BL UEs and LEs  Reflexes: 1+ in BL UE and LEs. Negative Hoffman's and babinski signs bilaterally.  CN: 2-12 grossly intact.  Coordination: No apparent tremors. No ataxia on FTN, HTS bilaterally.  Spasticity: MAS 0 in all extremities.       Assessment/Plan: 1. Functional deficits which require 3+ hours per day of interdisciplinary therapy in a comprehensive inpatient rehab setting. Physiatrist is providing close team supervision and 24 hour management of active medical problems listed below. Physiatrist and rehab team continue to assess barriers to discharge/monitor patient progress toward functional and medical goals  Care Tool:  Bathing              Bathing assist Assist Level: Moderate Assistance - Patient 50 - 74%     Upper Body Dressing/Undressing Upper body dressing        Upper body assist Assist Level: Minimal Assistance - Patient > 75%    Lower Body  Dressing/Undressing Lower body dressing            Lower body assist Assist for lower body dressing: Moderate Assistance - Patient 50 - 74%     Toileting Toileting    Toileting assist Assist for toileting: Minimal Assistance - Patient > 75%     Transfers Chair/bed transfer  Transfers assist     Chair/bed transfer assist level: Minimal Assistance - Patient > 75%     Locomotion Ambulation   Ambulation assist              Walk 10 feet activity   Assist       Assistive device: Walker-rolling   Walk 50 feet activity   Assist           Walk 150 feet activity   Assist           Walk 10 feet on uneven surface  activity   Assist           Wheelchair     Assist               Wheelchair 50 feet with 2 turns activity    Assist            Wheelchair 150 feet activity     Assist           Blood pressure (!) 92/59, pulse 79, temperature 98.3 F (36.8 C), resp. rate 18, height 5\' 11"  (1.803 m), weight 77.4 kg, SpO2 100 %.  Medical Problem List and Plan: 1. Functional deficits secondary to lumbar osteomyelitis/diskitis d/t MSSA bacteremia.heart vegetations/Endocarditis              -patient may shower             -ELOS/Goals: PT/OT/SLP supervision to mod I, 10-14 days  Admit for CIR- first day of evaluations- con't CIR PT and OT 2.  Antithrombotics: -DVT/anticoagulation:  Pharmaceutical: Heparin             -antiplatelet therapy: N/A 3. Low back pain/Pain Management: Oxycodone prn. Advised to use oxycodone prior to sleep             --will schedule low dose robaxin bid as reports ongoing lower back and LE spasms.   7/5- will increase Oxy to 10-15 mg q4 hours prn-can add long acting over weekend if necessary 4. Mood/Behavior/Sleep: LCSW to follow for evaluation and support.              -antipsychotic agents: N/A             --acute on chronic insomnia in part due to anxiety. Will schedule low dose trazodone. Continue prn melatonin   7/5- will change trazodone to 75 mg at bedtime- and can be titrated more over weekend if needed.  5. Neuropsych/cognition: This patient is capable of making decisions on his own behalf. 6. Skin/Wound Care: Routine pressure relief measures             --continue to monitor new lesions on bilateral feet for evolution/progress   7. Fluids/Electrolytes/Nutrition: Strict I/O. Daily weights. Labs TTS with HD. Add low salt restrictions             --continue Nephro supplements 8. 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  --IV cefazolin with HD. EOT 11/04/22              --Fevers have resolved. CRP-44/Sed rate 1.5 @  admission.  --Leucocytosis trending down from 23.9-->31.4-->13.9.    7/5- Pt's WBC down to 13/9- will recheck  labs in AM as well as qmonday-  9. SBO w/ enteritis: Has resolved.  10. ESRD: HD TTS at the end of the day to help with tolerance of therapy.              --strict I/O with daily weights. Continues to require 4 L oxygen per [place].  --On Liberalized diet but 1200 cc FR/4 gram salt.  Velphoro for elevated phos level. 7/5- Na down to 128- per Renal  11. Anemia of chronic disease: On Aranesp weekly every Thursday w/HD.  12. HTN: Monitor BP TID. On low dose tenormin but BP with drops during HD.   7/5- BP running low 90s systolic when laying down- I'm concerned will have orthostatic hypotension-will check with PT/OT  13. AHRF d/t cavitary pneumonia. Wean oxygen as able. Currently 2-4L; per patient not using CPAP/BiPAP   7/5- will wean O2 as allowed   I spent a total of  38  minutes on total care today- >50% coordination of care- due to  Review of chart- labs, vitals and therapy notes- also  documentation of increased trazodone and pain meds- and possible need for long acting.    LOS: 1 days A FACE TO FACE EVALUATION WAS PERFORMED  Nathanial Arrighi 09/26/2022, 10:42 AM

## 2022-09-26 NOTE — Progress Notes (Signed)
Inpatient Rehabilitation Admission Medication Review by a Pharmacist   A complete drug regimen review was completed for this patient to identify any potential clinically significant medication issues.   High Risk Drug Classes Is patient taking? Indication by Medication  Antipsychotic Yes Chlorpromazine - prn hiccups  Anticoagulant Yes SQH- VTE ppx  Antibiotic Yes, as an intravenous medication Cefazolin - bacteremia,  EOT 11/04/22  Opioid Yes Oxycodone - prn pain  Antiplatelet No    Hypoglycemics/insulin No    Vasoactive Medication Yes Atenolol - HTN  Chemotherapy No    Other Yes Benadryl - itching Robitussin - cough Calcitriol - renal osteodystrophy Tums - prn indigestion  Aranesp - anemia Melatonin - prn sleep Protonix - acid reflux Robaxin - muscle spasms Trazodone - prn sleep Velphoro - phos binder Atorvastatin - HLD        Type of Medication Issue Identified Description of Issue Recommendation(s)  Drug Interaction(s) (clinically significant)        Duplicate Therapy        Allergy        No Medication Administration End Date        Incorrect Dose        Additional Drug Therapy Needed        Significant med changes from prior encounter (inform family/care partners about these prior to discharge).      Other            Clinically significant medication issues were identified that warrant physician communication and completion of prescribed/recommended actions by midnight of the next day:  No    Time spent performing this drug regimen review (minutes):  15   Andreas Ohm, PharmD Pharmacy Resident  09/26/2022 8:38 AM

## 2022-09-26 NOTE — Progress Notes (Signed)
[facility] Progress Note   Dialysis Orders: [facility] TTS  3:45 450/A1.5x 2K/2Ca EDW 85.7kg TDC  -Heparin 3000 units IV TIW  -No ESA -Calcitriol 1.5 MCG PO TIW   Assessment/Plan: 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 ID following. Blood Cx 6/16 MSSA, negative Cx on 6/17, 6/19. TDC removed 6/18. Completed 72 hour line holiday. TTE concerning for endocarditis. TEE cancelled d/t high risk factors. LS MRI with possible L5-S1 discitis. Temp cath placed 6/21, but poor function requiring exchange - see below. Will be getting IV Cefazolin 2g q HD x 6 weeks (until 11/04/22), last given 7/4.  Dialysis Access: [facility] removed 6/18 - s/p line holiday, then temp line placed 6/21 with recurrent non-function issues despite exchange 6/26. S/p [facility] 7/1 with IR.  SBO - on CT Ab 6/19. Now resolved, surgery team has signed off. Hyperkalemia: Improved with HD. ESRD: Usual TTS schedule - then line issues preventing HD 6/27 -7/1. Now resolved and HD 7/1, 7/2 to correct. Tolerated HD Th w/ 3.5L net UF w/ no cramping. Next HD tomorrow. Perm Access: S/p L AVF ligation in 2019. Last seen by Dr. Edilia Bo 01/2022 - limited mobility in R arm so planning for L arm graft but had not scheduled surgery yet.   Hypertension/volume: BP better, still with volume on exam, UF as tolerated. Will need lower EDW. Anemia of ESRD: Hgb 8 - continue Aranesp q Thursday, have increased next dose. Metabolic bone disease: CorrCa ok, Phos high. Previously on Renvela as binder - stopped due to SBO, changed to Velphoro - have ^d dose to 2/meals.  Nutrition - Renal diet, Alb low - continue supps. Dispo - looks like transferring to CIR today.  Subjective:   Tolerated HD on thur with 3.5L off w/ no cramping. Just tx to CIR on 7/4.  Objective Vitals:   09/25/22 1800 09/25/22 2018 09/26/22 0500 09/26/22 0607  BP:  (!) 94/59  (!) 92/59  Pulse:  97  79  Resp:  18  18  Temp:  97.7 F (36.5 C)  98.3  F (36.8 C)  TempSrc:      SpO2: 95% 98%  100%  Weight:   77.4 kg   Height:       Physical Exam General: Well appearing man, NAD. [address] O2 in place. Heart: RRR; no murmur Lungs: Bibasilar rales Abdomen: soft Extremities: no LE edema Dialysis Access:  none  Additional Objective Labs: Basic Metabolic Panel: Recent Labs  Lab 09/21/22 1037 09/22/22 0416 09/23/22 0105 09/24/22 0022  NA 126* 124* 130* 128*  K 5.4* 6.0* 4.3 4.2  CL 86* 87* 92* 90*  CO2 20* 20* 20* 26  GLUCOSE 134* 129* 111* 133*  BUN 109* 119* 55* 38*  CREATININE 13.01* 14.43* 7.70* 5.05*  CALCIUM 8.0* 7.7* 7.7* 8.3*  PHOS 9.2*  --  7.5*  --    Liver Function Tests: Recent Labs  Lab 09/21/22 0513 09/21/22 1037 09/22/22 0416 09/23/22 0105 09/24/22 0022  AST 61*  --  59*  --  75*  ALT 6  --  7  --  10  ALKPHOS 97  --  118  --  105  BILITOT 0.6  --  0.8  --  0.6  PROT 7.1  --  7.6  --  7.8  ALBUMIN 1.7*   < > 1.8* 1.7* 2.2*   < > = values in this interval not displayed.   CBC: Recent Labs  Lab 09/20/22 0250 09/21/22 0513 09/22/22 0416 09/23/22 0105  09/24/22 0022  WBC 22.5* 24.6* 25.1* 17.5* 13.9*  HGB 7.8* 8.2* 7.7* 7.6* 8.0*  HCT 23.2* 23.6* 23.5* 22.6* 24.8*  MCV 82.3 81.7 80.5 79.0* 80.8  PLT 292 313 312 285 315   Medications:   ceFAZolin (ANCEF) IV 2 g (09/25/22 1816)    atenolol  12.5 mg Oral Daily   calcitRIOL  0.5 mcg Oral Q T,Th,Sat-1800   Chlorhexidine Gluconate Cloth  6 each Topical Daily   cinacalcet  30 mg Oral Q supper   darbepoetin (ARANESP) injection - DIALYSIS  100 mcg Subcutaneous Q Thu-1800   feeding supplement (NEPRO CARB STEADY)  237 mL Oral BID BM   heparin  5,000 Units Subcutaneous Q8H   methocarbamol  250 mg Oral BID   multivitamin  1 tablet Oral QHS   pantoprazole  40 mg Oral Daily   sucroferric oxyhydroxide  1,000 mg Oral TID WC   traZODone  25 mg Oral QHS

## 2022-09-27 DIAGNOSIS — N186 End stage renal disease: Secondary | ICD-10-CM | POA: Diagnosis not present

## 2022-09-27 DIAGNOSIS — E871 Hypo-osmolality and hyponatremia: Secondary | ICD-10-CM

## 2022-09-27 DIAGNOSIS — Z992 Dependence on renal dialysis: Secondary | ICD-10-CM | POA: Diagnosis not present

## 2022-09-27 DIAGNOSIS — K5901 Slow transit constipation: Secondary | ICD-10-CM | POA: Diagnosis not present

## 2022-09-27 DIAGNOSIS — I959 Hypotension, unspecified: Secondary | ICD-10-CM | POA: Diagnosis not present

## 2022-09-27 DIAGNOSIS — M4626 Osteomyelitis of vertebra, lumbar region: Secondary | ICD-10-CM | POA: Diagnosis not present

## 2022-09-27 LAB — CBC WITH DIFFERENTIAL/PLATELET
Abs Immature Granulocytes: 0.15 10*3/uL — ABNORMAL HIGH (ref 0.00–0.07)
Basophils Absolute: 0 10*3/uL (ref 0.0–0.1)
Basophils Relative: 0 %
Eosinophils Absolute: 0.3 10*3/uL (ref 0.0–0.5)
Eosinophils Relative: 2 %
HCT: 24.7 % — ABNORMAL LOW (ref 39.0–52.0)
Hemoglobin: 7.7 g/dL — ABNORMAL LOW (ref 13.0–17.0)
Immature Granulocytes: 1 %
Lymphocytes Relative: 6 %
Lymphs Abs: 0.8 10*3/uL (ref 0.7–4.0)
MCH: 25.9 pg — ABNORMAL LOW (ref 26.0–34.0)
MCHC: 31.2 g/dL (ref 30.0–36.0)
MCV: 83.2 fL (ref 80.0–100.0)
Monocytes Absolute: 1.1 10*3/uL — ABNORMAL HIGH (ref 0.1–1.0)
Monocytes Relative: 8 %
Neutro Abs: 10.9 10*3/uL — ABNORMAL HIGH (ref 1.7–7.7)
Neutrophils Relative %: 83 %
Platelets: 274 10*3/uL (ref 150–400)
RBC: 2.97 MIL/uL — ABNORMAL LOW (ref 4.22–5.81)
RDW: 16.1 % — ABNORMAL HIGH (ref 11.5–15.5)
WBC: 13.2 10*3/uL — ABNORMAL HIGH (ref 4.0–10.5)
nRBC: 0 % (ref 0.0–0.2)

## 2022-09-27 LAB — RENAL FUNCTION PANEL
Albumin: 2 g/dL — ABNORMAL LOW (ref 3.5–5.0)
Anion gap: 15 (ref 5–15)
BUN: 75 mg/dL — ABNORMAL HIGH (ref 8–23)
CO2: 25 mmol/L (ref 22–32)
Calcium: 8.2 mg/dL — ABNORMAL LOW (ref 8.9–10.3)
Chloride: 88 mmol/L — ABNORMAL LOW (ref 98–111)
Creatinine, Ser: 8.46 mg/dL — ABNORMAL HIGH (ref 0.61–1.24)
GFR, Estimated: 7 mL/min — ABNORMAL LOW (ref >60)
Glucose, Bld: 115 mg/dL — ABNORMAL HIGH (ref 70–99)
Phosphorus: 5.1 mg/dL — ABNORMAL HIGH (ref 2.5–4.6)
Potassium: 5 mmol/L (ref 3.5–5.1)
Sodium: 128 mmol/L — ABNORMAL LOW (ref 135–145)

## 2022-09-27 LAB — HEPATITIS B SURFACE ANTIBODY, QUANTITATIVE: Hep B S AB Quant (Post): 2059 m[IU]/mL

## 2022-09-27 MED ORDER — HEPARIN SODIUM (PORCINE) 1000 UNIT/ML IJ SOLN
INTRAMUSCULAR | Status: AC
  Administered 2022-09-27: 1000 [IU]
  Filled 2022-09-27: qty 4

## 2022-09-27 NOTE — Progress Notes (Signed)
PROGRESS NOTE   Subjective/Complaints:  Slept well, pain well controlled, LBM 2 days ago and he feels like he'd feel better if he had a BM so he will ask for the senna he has on his PRN list. Anuric, gets dialyzed today. Denies any other complaints or concerns today.   ROS:  Pt denies SOB, abd pain, CP, N/V/D, and vision changes Except for HPI  Objective:   No results found. Recent Labs    09/27/22 0548  WBC 13.2*  HGB 7.7*  HCT 24.7*  PLT 274    Recent Labs    09/27/22 0548  NA 128*  K 5.0  CL 88*  CO2 25  GLUCOSE 115*  BUN 75*  CREATININE 8.46*  CALCIUM 8.2*     Intake/Output Summary (Last 24 hours) at 09/27/2022 0730 Last data filed at 09/26/2022 1351 Gross per 24 hour  Intake 240 ml  Output --  Net 240 ml         Physical Exam: Vital Signs Blood pressure 90/62, pulse 81, temperature 98.1 F (36.7 C), resp. rate 18, height 5\' 11"  (1.803 m), weight 77 kg, SpO2 99 %.   General: awake, alert, appropriate, sitting up in bed- NAD HENT: conjugate gaze  CV: regular rate and rhythm; no JVD Pulmonary: no W/R/R, not using O2 on exam today.  GI: soft, NT, ND, (+)BS- slightly hypoactive Psychiatric: appropriate- interactive Neurological: Ox3  PRIOR EXAMS: Skin: + L upper chest HD catheter - dressing c/d/i + Nonpainful erythematous lesions on bilateral feet  MSK:      No apparent deformity.      Strength: Exam limited d/t being on HD; all 4 limbs equal, antigravity and against resistance   Neurologic exam:  Cognition: AAO to person, place, time and event.  + Mild lethargy - intermittently falling asleep during late dialysis; more awake in early exam Language: Fluent, No substitutions or neoglisms. No dysarthria. Names 3/3 objects correctly.  Memory: Recalls 3/3 objects at 5 minutes. No apparent deficits  Insight: Good  insight into current condition.  Mood: Pleasant affect, appropriate mood.   Sensation: To light touch intact in BL UEs and LEs  Reflexes: 1+ in BL UE and LEs. Negative Hoffman's and babinski signs bilaterally.  CN: 2-12 grossly intact.  Coordination: No apparent tremors. No ataxia on FTN, HTS bilaterally.  Spasticity: MAS 0 in all extremities.       Assessment/Plan: 1. Functional deficits which require 3+ hours per day of interdisciplinary therapy in a comprehensive inpatient rehab setting. Physiatrist is providing close team supervision and 24 hour management of active medical problems listed below. Physiatrist and rehab team continue to assess barriers to discharge/monitor patient progress toward functional and medical goals  Care Tool:  Bathing              Bathing assist Assist Level: Moderate Assistance - Patient 50 - 74%     Upper Body Dressing/Undressing Upper body dressing        Upper body assist Assist Level: Minimal Assistance - Patient > 75%    Lower Body Dressing/Undressing Lower body dressing            Lower body assist Assist  for lower body dressing: Moderate Assistance - Patient 50 - 74%     Toileting Toileting    Toileting assist Assist for toileting: Minimal Assistance - Patient > 75%     Transfers Chair/bed transfer  Transfers assist     Chair/bed transfer assist level: Minimal Assistance - Patient > 75%     Locomotion Ambulation   Ambulation assist      Assist level: Minimal Assistance - Patient > 75% Assistive device: Walker-rolling Max distance: 170'   Walk 10 feet activity   Assist     Assist level: Minimal Assistance - Patient > 75% Assistive device: Walker-rolling   Walk 50 feet activity   Assist    Assist level: Minimal Assistance - Patient > 75% Assistive device: Walker-rolling    Walk 150 feet activity   Assist    Assist level: Minimal Assistance - Patient > 75% Assistive device: Walker-rolling    Walk 10 feet on uneven surface  activity   Assist Walk 10 feet on  uneven surfaces activity did not occur: Safety/medical concerns         Wheelchair     Assist Is the patient using a wheelchair?: Yes Type of Wheelchair: Manual    Wheelchair assist level: Dependent - Patient 0% (pain limits)      Wheelchair 50 feet with 2 turns activity    Assist        Assist Level: Dependent - Patient 0%   Wheelchair 150 feet activity     Assist      Assist Level: Dependent - Patient 0%   Blood pressure 90/62, pulse 81, temperature 98.1 F (36.7 C), resp. rate 18, height 5\' 11"  (1.803 m), weight 77 kg, SpO2 99 %.  Medical Problem List and Plan: 1. Functional deficits secondary to lumbar osteomyelitis/diskitis d/t MSSA bacteremia.heart vegetations/Endocarditis              -patient may shower             -ELOS/Goals: PT/OT/SLP supervision to mod I, 10-14 days  -Con't CIR PT and OT 2.  Antithrombotics: -DVT/anticoagulation:  Pharmaceutical: Heparin 5000 q8h             -antiplatelet therapy: N/A 3. Low back pain/Pain Management: tylenol, Oxycodone prn. Advised to use oxycodone prior to sleep --will schedule low dose robaxin 250mg  bid as reports ongoing lower back and LE spasms.  -7/5- will increase Oxy to 10-15 mg q4 hours prn-can add long acting over weekend if necessary -09/27/22 pain seems controlled for now, monitor 4. Mood/Behavior/Sleep: LCSW to follow for evaluation and support.              -antipsychotic agents: N/A --acute on chronic insomnia in part due to anxiety. Will schedule low dose trazodone. Continue prn melatonin -7/5- will change trazodone to 75 mg at bedtime- and can be titrated more over weekend if needed.  -09/27/22 slept well, monitor 5. Neuropsych/cognition: This patient is capable of making decisions on his own behalf. 6. Skin/Wound Care: Routine pressure relief measures -continue to monitor new lesions on bilateral feet for evolution/progress   7. Fluids/Electrolytes/Nutrition: Strict I/O. Daily weights. Labs  TTS with HD. Add low salt restrictions             --continue Nephro supplements 8. 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  --IV cefazolin with HD. EOT 11/04/22              --  Fevers have resolved. CRP-44/Sed rate 1.5 @ admission.  --Leucocytosis trending down from 23.9-->31.4-->13.9.    -7/5- Pt's WBC down to 13/9- will recheck labs in AM as well as qmonday -09/27/22 WBC 13.2, cont to monitor 9. SBO w/ enteritis: Has resolved. Surgery team signed off 10. ESRD: HD TTS at the end of the day to help with tolerance of therapy.              --strict I/O with daily weights. Continues to require 4 L oxygen per [place].  --On Liberalized diet but 1200 cc FR/4 gram salt.  Velphoro for elevated phos level. -7/5- Na down to 128- per Renal  -09/27/22 dialysis today 11. Anemia of chronic disease: On Aranesp weekly every Thursday w/HD.  12. HTN: Monitor BP TID. On low dose tenormin 12.5mg  every day but BP with drops during HD.  -7/5- BP running low 90s systolic when laying down- I'm concerned will have orthostatic hypotension-will check with PT/OT  -09/27/22 BPs soft, monitor closely Vitals:   09/25/22 1648 09/25/22 1714 09/25/22 2018 09/26/22 0607  BP: (!) 87/61 (!) 87/61 (!) 94/59 (!) 92/59   09/26/22 1611 09/26/22 2016 09/27/22 0527  BP: (!) 97/59 98/70 90/62     13. AHRF d/t cavitary pneumonia. Wean oxygen as able. Currently 2-4L; per patient not using CPAP/BiPAP   -7/5- will wean O2 as allowed  -09/27/22 no O2 on today, monitor   LOS: 2 days A FACE TO FACE EVALUATION WAS PERFORMED  [address] 09/27/2022, 7:30 AM

## 2022-09-27 NOTE — Progress Notes (Signed)
   09/27/22 1744  Vitals  Temp 98.4 F (36.9 C)  Pulse Rate 96  Resp 20  BP 97/63  SpO2 93 %  O2 Device Room Air  Oxygen Therapy  Patient Activity (if Appropriate) In bed  Pulse Oximetry Type Continuous  Post Treatment  Dialyzer Clearance Clear  Duration of HD Treatment -hour(s) 3.75 hour(s)  Hemodialysis Intake (mL) 0 mL  Liters Processed 90  Fluid Removed (mL) 3000 mL  Tolerated HD Treatment Yes  Post-Hemodialysis Comments Pt tolerated Procedure without difficulties. Repoirt call to [facility] LPN   Received patient in bed to unit.  Alert and oriented.  Informed consent signed and in chart.   TX duration: 3.75  Patient tolerated well.  Transported back to the room  Alert, without acute distress.  Hand-off given to patient's nurse.   Access used: Yes Access issues: No  Total UF removed: 3000 Medication(s) given: See MAR Post HD VS: See Above Grid Post HD weight: 76.8 kg   [name]

## 2022-09-27 NOTE — Progress Notes (Signed)
   09/27/22 2100  BiPAP/CPAP/SIPAP  BiPAP/CPAP/SIPAP Pt Type Adult  Reason BIPAP/CPAP not in use Non-compliant

## 2022-09-27 NOTE — Progress Notes (Signed)
[facility] Progress Note   Dialysis Orders: [facility] TTS  3:45 450/A1.5x 2K/2Ca EDW 85.7kg TDC  -Heparin 3000 units IV TIW  -No ESA -Calcitriol 1.5 MCG PO TIW   Assessment/Plan: 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 ID following. Blood Cx 6/16 MSSA, negative Cx on 6/17, 6/19. TDC removed 6/18. Completed 72 hour line holiday. TTE concerning for endocarditis. TEE cancelled d/t high risk factors. LS MRI with possible L5-S1 discitis. Temp cath placed 6/21, but poor function requiring exchange - see below. Will be getting IV Cefazolin 2g q HD x 6 weeks (until 11/04/22), last given 7/4.  Dialysis Access: RIJ TDC removed 6/18 - s/p line holiday, then temp line placed 6/21 with recurrent non-function issues despite exchange 6/26. S/p [facility] 7/1 with IR.  SBO - on CT Ab 6/19. Now resolved, surgery team has signed off. Hyperkalemia: Improved with HD. ESRD: Usual TTS schedule - then line issues preventing HD 6/27 -7/1. Now resolved and HD 7/1, 7/2 to correct. Tolerated HD Th w/ 3.5L net UF w/ no cramping. Next HD today. Perm Access: S/p L AVF ligation in 2019. Last seen by Dr. Edilia Bo 01/2022 - limited mobility in R arm so planning for L arm graft but had not scheduled surgery yet.   Hypertension/volume: BP better, still with volume on exam, UF as tolerated. Will need lower EDW. Anemia of ESRD: Hgb 8 - continue Aranesp q Thursday, have increased next dose. Metabolic bone disease: CorrCa ok, Phos high. Previously on Renvela as binder - stopped due to SBO, changed to Velphoro - have ^d dose to 2/meals.  Nutrition - Renal diet, Alb low - continue supps. Dispo - looks like transferring to CIR today.  Subjective:   Tolerated HD on thur with 3.5L off w/ no cramping. Just tx to CIR on 7/4. Denies f/c/n/v; has a good appetite and getting out of bed walking w/ walker.  Objective Vitals:   09/26/22 0607 09/26/22 1611 09/26/22 2016 09/27/22 0527  BP: (!)  92/59 (!) 97/59 98/70 90/62   Pulse: 79 82 83 81  Resp: 18 18 18 18   Temp: 98.3 F (36.8 C) 98.7 F (37.1 C) 98.3 F (36.8 C) 98.1 F (36.7 C)  TempSrc:  Oral    SpO2: 100% 98% 96% 99%  Weight:    77 kg  Height:       Physical Exam General: Well appearing man, NAD. [place] O2 in place. Heart: RRR; no murmur Lungs: Bibasilar rales Abdomen: soft Extremities: no LE edema Dialysis Access:  LIJ TC  Additional Objective Labs: Basic Metabolic Panel: Recent Labs  Lab 09/21/22 1037 09/22/22 0416 09/23/22 0105 09/24/22 0022 09/27/22 0548  NA 126*   < > 130* 128* 128*  K 5.4*   < > 4.3 4.2 5.0  CL 86*   < > 92* 90* 88*  CO2 20*   < > 20* 26 25  GLUCOSE 134*   < > 111* 133* 115*  BUN 109*   < > 55* 38* 75*  CREATININE 13.01*   < > 7.70* 5.05* 8.46*  CALCIUM 8.0*   < > 7.7* 8.3* 8.2*  PHOS 9.2*  --  7.5*  --  5.1*   < > = values in this interval not displayed.   Liver Function Tests: Recent Labs  Lab 09/21/22 0513 09/21/22 1037 09/22/22 0416 09/23/22 0105 09/24/22 0022 09/27/22 0548  AST 61*  --  59*  --  75*  --   ALT 6  --  7  --  10  --   ALKPHOS 97  --  118  --  105  --   BILITOT 0.6  --  0.8  --  0.6  --   PROT 7.1  --  7.6  --  7.8  --   ALBUMIN 1.7*   < > 1.8* 1.7* 2.2* 2.0*   < > = values in this interval not displayed.   CBC: Recent Labs  Lab 09/21/22 0513 09/22/22 0416 09/23/22 0105 09/24/22 0022 09/27/22 0548  WBC 24.6* 25.1* 17.5* 13.9* 13.2*  NEUTROABS  --   --   --   --  10.9*  HGB 8.2* 7.7* 7.6* 8.0* 7.7*  HCT 23.6* 23.5* 22.6* 24.8* 24.7*  MCV 81.7 80.5 79.0* 80.8 83.2  PLT 313 312 285 315 274   Medications:  [START ON 09/30/2022]  ceFAZolin (ANCEF) IV      ceFAZolin (ANCEF) IV      atenolol  12.5 mg Oral Daily   calcitRIOL  0.5 mcg Oral Q T,Th,Sat-1800   Chlorhexidine Gluconate Cloth  6 each Topical Q12H   cinacalcet  30 mg Oral Q supper   darbepoetin (ARANESP) injection - DIALYSIS  100 mcg Subcutaneous Q Thu-1800   feeding supplement  (NEPRO CARB STEADY)  237 mL Oral BID BM   heparin  5,000 Units Subcutaneous Q8H   methocarbamol  250 mg Oral BID   multivitamin  1 tablet Oral QHS   pantoprazole  40 mg Oral Daily   sucroferric oxyhydroxide  1,000 mg Oral TID WC   traZODone  75 mg Oral QHS

## 2022-09-27 NOTE — Progress Notes (Signed)
Patient returned to the unit from hemodialysis.     Tilden Dome, LPN

## 2022-09-28 DIAGNOSIS — K5901 Slow transit constipation: Secondary | ICD-10-CM

## 2022-09-28 MED ORDER — MENTHOL 3 MG MT LOZG
1.0000 | LOZENGE | OROMUCOSAL | Status: DC | PRN
  Administered 2022-10-01 – 2022-10-09 (×2): 3 mg via ORAL
  Filled 2022-09-28: qty 9

## 2022-09-28 MED ORDER — MIDODRINE HCL 5 MG PO TABS
2.5000 mg | ORAL_TABLET | Freq: Two times a day (BID) | ORAL | Status: DC
  Administered 2022-09-28 – 2022-09-30 (×5): 2.5 mg via ORAL
  Filled 2022-09-28 (×6): qty 1

## 2022-09-28 MED ORDER — POLYETHYLENE GLYCOL 3350 17 G PO PACK: 17.0000 g | PACK | Freq: Every day | ORAL | Status: DC | PRN

## 2022-09-28 MED ORDER — POLYETHYLENE GLYCOL 3350 17 G PO PACK
17.0000 g | PACK | Freq: Once | ORAL | Status: AC
  Administered 2022-09-28: 17 g via ORAL
  Filled 2022-09-28: qty 1

## 2022-09-28 NOTE — Progress Notes (Signed)
PROGRESS NOTE   Subjective/Complaints:  Slept ok, pain well controlled, LBM last night but not very much, agreeable with trying dose of miralax and senna again today, then has PRN miralax for additional attempt if needed-- knows he can also ask for enema/suppositories if desired but he doesn't want to do that. Dialysis yesterday went alright. BPs a little softer today, holding atenolol this morning. Pt denies symptoms of orthostasis. Denies any other complaints or concerns today.   ROS:  Pt denies SOB, abd pain, CP, N/V/D, lightheadedness, and vision changes Except for HPI  Objective:   No results found. Recent Labs    09/27/22 0548  WBC 13.2*  HGB 7.7*  HCT 24.7*  PLT 274   Recent Labs    09/27/22 0548  NA 128*  K 5.0  CL 88*  CO2 25  GLUCOSE 115*  BUN 75*  CREATININE 8.46*  CALCIUM 8.2*    Intake/Output Summary (Last 24 hours) at 09/28/2022 1141 Last data filed at 09/28/2022 0829 Gross per 24 hour  Intake 120 ml  Output 3000 ml  Net -2880 ml        Physical Exam: Vital Signs Blood pressure (!) 81/62, pulse 88, temperature 98.9 F (37.2 C), resp. rate 18, height 5\' 11"  (1.803 m), weight 75.2 kg, SpO2 95 %.   General: awake, alert, appropriate, NAD, sitting at EOB with PT today HENT: conjugate gaze  CV: regular rate and rhythm; no JVD Pulmonary: no W/R/R, not using O2 on exam today.  GI: soft, NT, ND, (+)BS- slightly hypoactive but less than yesterday Psychiatric: appropriate- interactive Neurological: Ox3  PRIOR EXAMS: Skin: + L upper chest HD catheter - dressing c/d/i + Nonpainful erythematous lesions on bilateral feet  MSK:      No apparent deformity.      Strength: Exam limited d/t being on HD; all 4 limbs equal, antigravity and against resistance   Neurologic exam:  Cognition: AAO to person, place, time and event.  + Mild lethargy - intermittently falling asleep during late dialysis; more  awake in early exam Language: Fluent, No substitutions or neoglisms. No dysarthria. Names 3/3 objects correctly.  Memory: Recalls 3/3 objects at 5 minutes. No apparent deficits  Insight: Good  insight into current condition.  Mood: Pleasant affect, appropriate mood.  Sensation: To light touch intact in BL UEs and LEs  Reflexes: 1+ in BL UE and LEs. Negative Hoffman's and babinski signs bilaterally.  CN: 2-12 grossly intact.  Coordination: No apparent tremors. No ataxia on FTN, HTS bilaterally.  Spasticity: MAS 0 in all extremities.       Assessment/Plan: 1. Functional deficits which require 3+ hours per day of interdisciplinary therapy in a comprehensive inpatient rehab setting. Physiatrist is providing close team supervision and 24 hour management of active medical problems listed below. Physiatrist and rehab team continue to assess barriers to discharge/monitor patient progress toward functional and medical goals  Care Tool:  Bathing    Body parts bathed by patient: Right arm, Left arm, Chest, Abdomen (UB bathing only)         Bathing assist Assist Level: Moderate Assistance - Patient 50 - 74%     Upper Body Dressing/Undressing  Upper body dressing   What is the patient wearing?: Pull over shirt    Upper body assist Assist Level: Moderate Assistance - Patient 50 - 74%    Lower Body Dressing/Undressing Lower body dressing            Lower body assist Assist for lower body dressing: Moderate Assistance - Patient 50 - 74%     Toileting Toileting    Toileting assist Assist for toileting: Minimal Assistance - Patient > 75%     Transfers Chair/bed transfer  Transfers assist     Chair/bed transfer assist level: Minimal Assistance - Patient > 75%     Locomotion Ambulation   Ambulation assist      Assist level: Minimal Assistance - Patient > 75% Assistive device: Walker-rolling Max distance: 170'   Walk 10 feet activity   Assist     Assist level:  Minimal Assistance - Patient > 75% Assistive device: Walker-rolling   Walk 50 feet activity   Assist    Assist level: Minimal Assistance - Patient > 75% Assistive device: Walker-rolling    Walk 150 feet activity   Assist    Assist level: Minimal Assistance - Patient > 75% Assistive device: Walker-rolling    Walk 10 feet on uneven surface  activity   Assist Walk 10 feet on uneven surfaces activity did not occur: Safety/medical concerns         Wheelchair     Assist Is the patient using a wheelchair?: Yes Type of Wheelchair: Manual    Wheelchair assist level: Dependent - Patient 0% (pain limits)      Wheelchair 50 feet with 2 turns activity    Assist        Assist Level: Dependent - Patient 0%   Wheelchair 150 feet activity     Assist      Assist Level: Dependent - Patient 0%   Blood pressure (!) 81/62, pulse 88, temperature 98.9 F (37.2 C), resp. rate 18, height 5\' 11"  (1.803 m), weight 75.2 kg, SpO2 95 %.  Medical Problem List and Plan: 1. Functional deficits secondary to lumbar osteomyelitis/diskitis d/t MSSA bacteremia.heart vegetations/Endocarditis              -patient may shower             -ELOS/Goals: PT/OT/SLP supervision to mod I, 10-14 days  -Con't CIR PT and OT 2.  Antithrombotics: -DVT/anticoagulation:  Pharmaceutical: Heparin 5000 q8h             -antiplatelet therapy: N/A 3. Low back pain/Pain Management: tylenol, Oxycodone prn. Advised to use oxycodone prior to sleep --will schedule low dose robaxin 250mg  bid as reports ongoing lower back and LE spasms.  -7/5- will increase Oxy to 10-15 mg q4 hours prn-can add long acting over weekend if necessary -7/6-7/24 pain seems controlled for now, monitor 4. Mood/Behavior/Sleep: LCSW to follow for evaluation and support.              -antipsychotic agents: N/A --acute on chronic insomnia in part due to anxiety. Will schedule low dose trazodone. Continue prn melatonin -7/5- will  change trazodone to 75 mg at bedtime- and can be titrated more over weekend if needed.  -7/6-7/24 slept well, monitor 5. Neuropsych/cognition: This patient is capable of making decisions on his own behalf. 6. Skin/Wound Care: Routine pressure relief measures -continue to monitor new lesions on bilateral feet for evolution/progress   7. Fluids/Electrolytes/Nutrition: Strict I/O. Daily weights. Labs TTS with HD. Add low salt restrictions             --  continue Nephro supplements 8. 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  --IV cefazolin with HD. EOT 11/04/22              --Fevers have resolved. CRP-44/Sed rate 1.5 @ admission.  --Leucocytosis trending down from 23.9-->31.4-->13.9.    -7/5- Pt's WBC down to 13/9- will recheck labs in AM as well as qmonday -09/27/22 WBC 13.2, cont to monitor 9. SBO w/ enteritis: Has resolved. Surgery team signed off 10. ESRD: HD TTS at the end of the day to help with tolerance of therapy.              --strict I/O with daily weights. Continues to require 4 L oxygen per [place].  --On Liberalized diet but 1200 cc FR/4 gram salt.  Velphoro for elevated phos level. -7/5- Na down to 128- per Renal  -09/27/22 dialysis today 11. Anemia of chronic disease: On Aranesp weekly every Thursday w/HD.  12. HTN: Monitor BP TID. On low dose tenormin 12.5mg  every day but BP with drops during HD.  -7/5- BP running low 90s systolic when laying down- I'm concerned will have orthostatic hypotension-will check with PT/OT  -09/27/22 BPs soft, monitor closely -09/28/22 BP a little softer today, hold atenolol this morning, monitor to see if changes need to be made to that. Denies orthostatic symptoms.  Vitals:   09/27/22 1400 09/27/22 1433 09/27/22 1506 09/27/22 1530  BP: 104/65 (!) 95/58 91/61 94/63    09/27/22 1600 09/27/22 1634 09/27/22 1705 09/27/22 1744  BP: 92/65  93/65 98/74 97/63    09/27/22 1803 09/27/22 2133 09/28/22 0515 09/28/22 0900  BP: 120/73 (!) 100/59 (!) 83/57 (!) 81/62    13. AHRF d/t cavitary pneumonia. Wean oxygen as able. Currently 2-4L; per patient not using CPAP/BiPAP   -7/5- will wean O2 as allowed  -09/27/22 no O2 on today, monitor 14. Constipation:  -09/28/22 had a small BM yesterday, lots of gas, will do dose of miralax now then add on QD PRN dose, advised to take senna with the miralax dose now, then could use the PRN miralax dose after lunch if still not successful-- if not working, then use suppository +/- enema.    LOS: 3 days A FACE TO FACE EVALUATION WAS PERFORMED  [address] 09/28/2022, 11:41 AM

## 2022-09-28 NOTE — Progress Notes (Signed)
Charge RN notified [place], [place] and [place], MD of patient's blood pressures. New orders placed.    Tilden Dome, LPN

## 2022-09-28 NOTE — Progress Notes (Signed)
Physical Therapy Session Note  Patient Details  Name: Ryan [name] MRN: [id number] Date of Birth: [birth year]  Today's Date: 09/28/2022 PT Individual Time: 4098-1191, 4782-9562  PT Individual Time Calculation (min): 41 min , 23 min  Missed Minutes: 19, 22 minutes (religious reasons and low BP)   Short Term Goals: Week 1:  PT Short Term Goal 1 (Week 1): Pt will complete sit<>stand transfer with CGA and LRAD PT Short Term Goal 2 (Week 1): Pt will ambulate 200' with LRAD CGA PT Short Term Goal 3 (Week 1): Pt will complete bed mobility CGA without hospital bed features PT Short Term Goal 4 (Week 1): Pt will complete up/down 1 curb step with RW CGA  Skilled Therapeutic Interventions/Progress Updates:      Therapy Documentation Precautions:  Precautions Precautions: Fall Restrictions Weight Bearing Restrictions: No  Treatment Session 1:   On PT arrival pt reports low BP and PT checked in with nursing. Vitals re-assessed with BP 82/51, pulse 81, O2: 95% on RA. PT notified nurse who recommended pt utilized thigh high ted hose and ace wraps for BP management. Pt obtained and donned total A for time management and energy conservation and recorded orthostatic vitals:   Supine: 80/52, pulse 83, O2 97   Sitting: 99/60, pulse 88, O2 99   Standing: 113/62, pulse 91  Pt requires supervision for bed mobility with HOB flat and use of rails and CGA with sit to stand and ambulatory transfer with RW to recliner. Pt left seated in recliner at bedside and requests personal time for spiritual/religious reasons. Pt left with alarm on and all needs within reach.   Treatment Session 2:   Pt received seated in recliner with nursing student present for vitals assessment,  BP of 67/54 and nursing notified. Nursing performed manual assessment as well, see documentation for details. Pt encouraged to return to bed and reluctantly agreeable. Pt requires CGA with sit to stand and stand pivot transfer to bed with  RW. Pt left in trendelenburg in bed for BP management with all needs in reach and alarm on.   Therapy/Group: Individual Therapy  Truitt Leep Truitt Leep PT, DPT  09/28/2022, 7:57 AM

## 2022-09-28 NOTE — Progress Notes (Signed)
[facility] Progress Note   Dialysis Orders: [facility] TTS  3:45 450/A1.5x 2K/2Ca EDW 85.7kg TDC  -Heparin 3000 units IV TIW  -No ESA -Calcitriol 1.5 MCG PO TIW   Assessment/Plan: 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 ID following. Blood Cx 6/16 MSSA, negative Cx on 6/17, 6/19. TDC removed 6/18. Completed 72 hour line holiday. TTE concerning for endocarditis. TEE cancelled d/t high risk factors. LS MRI with possible L5-S1 discitis. Temp cath placed 6/21, but poor function requiring exchange - see below. Will be getting IV Cefazolin 2g q HD x 6 weeks (until 11/04/22), last given 7/4.  Dialysis Access: RIJ TDC removed 6/18 - s/p line holiday, then temp line placed 6/21 with recurrent non-function issues despite exchange 6/26. S/p [facility] 7/1 with IR.  SBO - on CT Ab 6/19. Now resolved, surgery team has signed off. Hyperkalemia: Improved with HD. ESRD: Usual TTS schedule - then line issues preventing HD 6/27 -7/1. Now resolved and HD 7/1, 7/2 to correct. Tolerated HD Th w/ 3.5L net UF and 3L on 7/6. Appears to be euvolemic today; EDW is likely ~75.2-76kg.   Next HD Tuesday. Perm Access: S/p L AVF ligation in 2019. Last seen by Dr. Edilia Bo 01/2022 - limited mobility in R arm so planning for L arm graft but had not scheduled surgery yet.   Hypertension/volume: BP better, still with volume on exam, UF as tolerated. Will need lower EDW. Anemia of ESRD: Hgb 8 - continue Aranesp q Thursday, have increased next dose. Metabolic bone disease: CorrCa ok, Phos high. Previously on Renvela as binder - stopped due to SBO, changed to Velphoro - have ^d dose to 2/meals.  Nutrition - Renal diet, Alb low - continue supps. Dispo - looks like transferring to CIR today.  Subjective:   Tolerated HD on thur with 3.5L off w/ no cramping. Just tx to CIR on 7/4. Tolerated HD on 7/6. Denies f/c/n/v; has a good appetite and getting out of bed walking w/  walker.  Objective Vitals:   09/27/22 1744 09/27/22 1803 09/27/22 2133 09/28/22 0515  BP: 97/63 120/73 (!) 100/59 (!) 83/57  Pulse: 96 98 63 90  Resp: 20 18 18 18   Temp: 98.4 F (36.9 C) 98.6 F (37 C) 98.3 F (36.8 C) 98.9 F (37.2 C)  TempSrc:   Oral   SpO2: 93% 100% 94% 95%  Weight:    75.2 kg  Height:       Physical Exam General: Well appearing man, NAD. [place] O2 in place. Heart: RRR; no murmur Lungs: Bibasilar rales Abdomen: soft Extremities: no LE edema Dialysis Access:  LIJ TC  Additional Objective Labs: Basic Metabolic Panel: Recent Labs  Lab 09/21/22 1037 09/22/22 0416 09/23/22 0105 09/24/22 0022 09/27/22 0548  NA 126*   < > 130* 128* 128*  K 5.4*   < > 4.3 4.2 5.0  CL 86*   < > 92* 90* 88*  CO2 20*   < > 20* 26 25  GLUCOSE 134*   < > 111* 133* 115*  BUN 109*   < > 55* 38* 75*  CREATININE 13.01*   < > 7.70* 5.05* 8.46*  CALCIUM 8.0*   < > 7.7* 8.3* 8.2*  PHOS 9.2*  --  7.5*  --  5.1*   < > = values in this interval not displayed.   Liver Function Tests: Recent Labs  Lab 09/22/22 0416 09/23/22 0105 09/24/22 0022 09/27/22 0548  AST 59*  --  75*  --  ALT 7  --  10  --   ALKPHOS 118  --  105  --   BILITOT 0.8  --  0.6  --   PROT 7.6  --  7.8  --   ALBUMIN 1.8* 1.7* 2.2* 2.0*   CBC: Recent Labs  Lab 09/22/22 0416 09/23/22 0105 09/24/22 0022 09/27/22 0548  WBC 25.1* 17.5* 13.9* 13.2*  NEUTROABS  --   --   --  10.9*  HGB 7.7* 7.6* 8.0* 7.7*  HCT 23.5* 22.6* 24.8* 24.7*  MCV 80.5 79.0* 80.8 83.2  PLT 312 285 315 274   Medications:  [START ON 09/30/2022]  ceFAZolin (ANCEF) IV      ceFAZolin (ANCEF) IV 3 g (09/28/22 0123)    atenolol  12.5 mg Oral Daily   calcitRIOL  0.5 mcg Oral Q T,Th,Sat-1800   Chlorhexidine Gluconate Cloth  6 each Topical Q12H   cinacalcet  30 mg Oral Q supper   darbepoetin (ARANESP) injection - DIALYSIS  100 mcg Subcutaneous Q Thu-1800   feeding supplement (NEPRO CARB STEADY)  237 mL Oral BID BM   heparin  5,000  Units Subcutaneous Q8H   methocarbamol  250 mg Oral BID   multivitamin  1 tablet Oral QHS   pantoprazole  40 mg Oral Daily   sucroferric oxyhydroxide  1,000 mg Oral TID WC   traZODone  75 mg Oral QHS

## 2022-09-28 NOTE — Progress Notes (Signed)
Patient stable, no issues noted. BP stable.   Tilden Dome, LPN

## 2022-09-28 NOTE — Progress Notes (Signed)
  Patient request to keep back brace on while in bed for comfort. No issues noted to skin under back brace; skin assessed.         Tilden Dome, LPN

## 2022-09-28 NOTE — Progress Notes (Signed)
Physical Therapy Session Note  Patient Details  Name: Ryan [name] MRN: [id number] Date of Birth: Nov 15, 1960  Today's Date: 09/28/2022 PT Individual Time: 1445-1525 PT Individual Time Calculation (min): 40 min   Short Term Goals: Week 1:  PT Short Term Goal 1 (Week 1): Pt will complete sit<>stand transfer with CGA and LRAD PT Short Term Goal 2 (Week 1): Pt will ambulate 200' with LRAD CGA PT Short Term Goal 3 (Week 1): Pt will complete bed mobility CGA without hospital bed features PT Short Term Goal 4 (Week 1): Pt will complete up/down 1 curb step with RW CGA  Skilled Therapeutic Interventions/Progress Updates:    Chart reviewed and pt agreeable to therapy. Pt received supine in bed with c/o pain in back that was not quantified. Also of note, pt reported he previously had very low BP in sitting and had been placed in supine position by nursing, which PT confirmed with RN. Session focused on monitoring BP response to position change and activity to promote safety in continued therapy sessions and functional transfers and amb to progress mobility indpendence. Pt initiated session with supine>sit transfer using SBA + strong VC for sequencing + increased time in setting of pain. Pt BP assessed in sitting as reported below. Pt denied dizziness or lightheadedness. Pt then completed sit to stand with CGA + RW, and BP again assessed in standing as reported below with pt denying symptoms. Pt completed blocked practice of 63ft around room with CGA + RW. Pt BP assessed after amb as reported below, with noted drop. Pt also reported high fatigue and pain. Pt returned to bed with SBA + strong VC + bed features. At end of session, pt was left semi-reclined in bed with alarm engaged, nurse call bell and all needs in reach.  BP: Supine: 97/53 mmHg Sitting: 106/62  mmHg Standing: 122/73 mmHg After Ambulation of 52ft: 113/51 mmHg   Therapy Documentation Precautions:  Precautions Precautions:  Fall Restrictions Weight Bearing Restrictions: No      Therapy/Group: Individual Therapy  Dionne Milo, PT, DPT 09/28/2022, 4:05 PM

## 2022-09-28 NOTE — Progress Notes (Signed)
Occupational Therapy Session Note  Patient Details  Name: RAYNAV ETHRIDGE MRN: [id number] Date of Birth: 07/09/1960  Today's Date: 09/28/2022 OT Individual Time: 0930-1011 OT Individual Time Calculation (min): 41 min    Short Term Goals: Week 1:  OT Short Term Goal 1 (Week 1): Pt will complete bathing at CGA in shower OT Short Term Goal 2 (Week 1): Pt will complete AE training to demonstrate LB dressing CGA OT Short Term Goal 3 (Week 1): Pt will complete standing activity with tolerance >5 minutes  Skilled Therapeutic Interventions/Progress Updates:    Pt resting in bed upon arrival. Pt reported that his BP had been low all morning. See below. RN aware and PA notified. Supine>sit EOB with min A. Sitting balance with supervision but pt required BUE support for pain relief when sitting. Pt required mod A for UB bathing and dressing seated EOB. Unable to get BP reading seated EOB. Pt required mod A for sit>supine. Pt able to reposition in bed without assistance.   BP supine: 76/55 and 76/56  RN aware.  Pt remained in bed with all needs within reach. Bed alarm activated.   Therapy Documentation Precautions:  Precautions Precautions: Fall Restrictions Weight Bearing Restrictions: No   Pain:  Pt reports increased low back pain with activity; pt's back support donned in sitting and repositioned    Therapy/Group: Individual Therapy  Rich Brave 09/28/2022, 10:14 AM

## 2022-09-28 NOTE — Progress Notes (Signed)
Patient request to keep back brace on while in bed for comfort. No issues noted to skin under back brace; skin assessed.     Tilden Dome, LPN

## 2022-09-28 NOTE — Progress Notes (Signed)
This nurse notified because the patients blood pressure this afternoon was soft to 67/54 on manual blood pressure patients BP dropped to 70/50 patient non symptomatic and runs usually in the 90's/ 50's systolic patient transferred into bed in trendelenburg position and blood pressure increased to 91/58 Md notified and new orders received for midodrine 2.5 mg BID for blood pressure support. As well as ted hose and thigh high ace wraps. Patient bed in lowest position with call light in reach safety ensured. Current plan of care in progress.

## 2022-09-28 NOTE — Progress Notes (Signed)
PHARMACY CONSULT NOTE FOR:  OUTPATIENT  PARENTERAL ANTIBIOTIC THERAPY (OPAT)  Informational as the patient will receive antibiotics at the outpatient HD center  Indication: MSSA discitis/IE Regimen: Cefazolin 2g/HD-T/Th + Cefazolin 3g/HD-Sa End date: 11/04/22 (8 wks from line removal 09/09/22)  IV antibiotic discharge orders are pended. To discharging provider:  please sign these orders via discharge navigator,  Select New Orders & click on the button choice - Manage This Unsigned Work.     Thank you for allowing pharmacy to be a part of this patient's care.  Enos Fling, PharmD PGY-1 Acute Care Pharmacy Resident 09/28/2022 12:49 PM  **Pharmacist phone directory can now be found on [email or web address] ([email or web address]).  Listed under [facility].

## 2022-09-29 DIAGNOSIS — R7881 Bacteremia: Secondary | ICD-10-CM | POA: Diagnosis not present

## 2022-09-29 DIAGNOSIS — D631 Anemia in chronic kidney disease: Secondary | ICD-10-CM | POA: Diagnosis not present

## 2022-09-29 DIAGNOSIS — Z992 Dependence on renal dialysis: Secondary | ICD-10-CM | POA: Diagnosis not present

## 2022-09-29 DIAGNOSIS — B9561 Methicillin susceptible Staphylococcus aureus infection as the cause of diseases classified elsewhere: Secondary | ICD-10-CM | POA: Diagnosis not present

## 2022-09-29 DIAGNOSIS — N250 Renal osteodystrophy: Secondary | ICD-10-CM | POA: Diagnosis not present

## 2022-09-29 DIAGNOSIS — N186 End stage renal disease: Secondary | ICD-10-CM | POA: Diagnosis not present

## 2022-09-29 DIAGNOSIS — I120 Hypertensive chronic kidney disease with stage 5 chronic kidney disease or end stage renal disease: Secondary | ICD-10-CM | POA: Diagnosis not present

## 2022-09-29 DIAGNOSIS — M4626 Osteomyelitis of vertebra, lumbar region: Secondary | ICD-10-CM | POA: Diagnosis not present

## 2022-09-29 LAB — CBC WITH DIFFERENTIAL/PLATELET
Abs Immature Granulocytes: 0.1 10*3/uL — ABNORMAL HIGH (ref 0.00–0.07)
Basophils Absolute: 0 10*3/uL (ref 0.0–0.1)
Basophils Relative: 0 %
Eosinophils Absolute: 0.4 10*3/uL (ref 0.0–0.5)
Eosinophils Relative: 3 %
HCT: 25 % — ABNORMAL LOW (ref 39.0–52.0)
Hemoglobin: 7.8 g/dL — ABNORMAL LOW (ref 13.0–17.0)
Immature Granulocytes: 1 %
Lymphocytes Relative: 5 %
Lymphs Abs: 0.6 10*3/uL — ABNORMAL LOW (ref 0.7–4.0)
MCH: 26 pg (ref 26.0–34.0)
MCHC: 31.2 g/dL (ref 30.0–36.0)
MCV: 83.3 fL (ref 80.0–100.0)
Monocytes Absolute: 1.3 10*3/uL — ABNORMAL HIGH (ref 0.1–1.0)
Monocytes Relative: 10 %
Neutro Abs: 10.7 10*3/uL — ABNORMAL HIGH (ref 1.7–7.7)
Neutrophils Relative %: 81 %
Platelets: 267 10*3/uL (ref 150–400)
RBC: 3 MIL/uL — ABNORMAL LOW (ref 4.22–5.81)
RDW: 15.7 % — ABNORMAL HIGH (ref 11.5–15.5)
WBC: 13.1 10*3/uL — ABNORMAL HIGH (ref 4.0–10.5)
nRBC: 0 % (ref 0.0–0.2)

## 2022-09-29 LAB — C-REACTIVE PROTEIN: CRP: 17.3 mg/dL — ABNORMAL HIGH (ref <1.0)

## 2022-09-29 LAB — RENAL FUNCTION PANEL
Albumin: 2 g/dL — ABNORMAL LOW (ref 3.5–5.0)
Anion gap: 14 (ref 5–15)
BUN: 68 mg/dL — ABNORMAL HIGH (ref 8–23)
CO2: 23 mmol/L (ref 22–32)
Calcium: 8.3 mg/dL — ABNORMAL LOW (ref 8.9–10.3)
Chloride: 90 mmol/L — ABNORMAL LOW (ref 98–111)
Creatinine, Ser: 7.87 mg/dL — ABNORMAL HIGH (ref 0.61–1.24)
GFR, Estimated: 7 mL/min — ABNORMAL LOW (ref >60)
Glucose, Bld: 97 mg/dL (ref 70–99)
Phosphorus: 4.4 mg/dL (ref 2.5–4.6)
Potassium: 5.3 mmol/L — ABNORMAL HIGH (ref 3.5–5.1)
Sodium: 127 mmol/L — ABNORMAL LOW (ref 135–145)

## 2022-09-29 LAB — GLUCOSE, CAPILLARY: Glucose-Capillary: 102 mg/dL — ABNORMAL HIGH (ref 70–99)

## 2022-09-29 LAB — SEDIMENTATION RATE: Sed Rate: >140 mm/hr — ABNORMAL HIGH (ref 0–16)

## 2022-09-29 MED ORDER — CHLORHEXIDINE GLUCONATE CLOTH 2 % EX PADS: 6.0000 | MEDICATED_PAD | Freq: Every day | CUTANEOUS | Status: DC

## 2022-09-29 NOTE — Progress Notes (Signed)
Physical Therapy Session Note  Patient Details  Name: Ryan [name] MRN: [id number] Date of Birth: [birth year]  Today's Date: 09/29/2022 PT Individual Time: 1300-1355 PT Individual Time Calculation (min): 55 min   Short Term Goals: Week 1:  PT Short Term Goal 1 (Week 1): Pt will complete sit<>stand transfer with CGA and LRAD PT Short Term Goal 2 (Week 1): Pt will ambulate 200' with LRAD CGA PT Short Term Goal 3 (Week 1): Pt will complete bed mobility CGA without hospital bed features PT Short Term Goal 4 (Week 1): Pt will complete up/down 1 curb step with RW CGA  Skilled Therapeutic Interventions/Progress Updates:    Chart reviewed and pt agreeable to therapy. Pt received semi-reclined in bed with c/o pain in back that was not quantified. Session focused on amb endurance, balance training, and stair navigation to promote safe home access. Pt initiated session with transfer to EOB and SSTT to WC using close S + RW. Pt taken to therapy gym for time management. Pt then completed amb of 253ft with CGA + RW.  Pt also completed blocked practice of balance exercises of neutral stance, narrow stances, and eyes closed all with CGA. Pt then completed blocked practice of 2 step ups/downs at 3" steps with B rails using MinA and fading to CGA. Pt then returned to room and required S + Rw for return to bed. At end of session, pt was left semi-reclined in bed with alarm engaged, nurse call bell and all needs in reach.     Therapy Documentation Precautions:  Precautions Precautions: Fall Restrictions Weight Bearing Restrictions: No    Therapy/Group: Individual Therapy  Dionne Milo, PT, DPT 09/29/2022, 5:23 PM

## 2022-09-29 NOTE — Progress Notes (Signed)
Occupational Therapy Session Note  Patient Details  Name: Ryan [name] MRN: [id number] Date of Birth: [birth year]  Today's Date: 09/29/2022 OT Individual Time: 0930-1015 OT Individual Time Calculation (min): 45 min    Short Term Goals: Week 1:  OT Short Term Goal 1 (Week 1): Pt will complete bathing at CGA in shower OT Short Term Goal 2 (Week 1): Pt will complete AE training to demonstrate LB dressing CGA OT Short Term Goal 3 (Week 1): Pt will complete standing activity with tolerance >5 minutes  Skilled Therapeutic Interventions/Progress Updates:      Therapy Documentation Precautions:  Precautions Precautions: Fall Restrictions Weight Bearing Restrictions: No General: "Hi [name]!" Pt supine in bed upon OT arrival, agreeable to OT session.  Vitals: 90 bpm, 98% SpO2, 92/59 BP  Pain: no pain noted/reported. Pt reports low back spasms.   ADL: Bed mobility: Min A supine><EOB Grooming: set-up seated in W/C at sink for shaving and washing face, VC for initiation Oral hygiene: set-up seated in W/C at sink, able to manipulate containers, VC for initiation UB dressing: SBA for overhead shirt seated in W/C LB dressing: Min A, seated in W/C for figure 4 method, assistance with threading over Rt foot, CGA standing to manage over waist Footwear: Max A for slip socks Bathing: CGA sponge bath seated/standing at sink, CGA in standing to wash peri area, pt able to wash/rinse/dry all body parts, VC for initiation for tasks Transfers: pt completed various transfers this date, sit to stands and stand pivot transfers with RW    Pt supine in bed with bed alarm activated, 2 bed rails up, call light within reach and 4Ps assessed.   Therapy/Group: Individual Therapy  Velia Meyer, OTD, OTR/L 09/29/2022, 4:53 PM

## 2022-09-29 NOTE — Progress Notes (Signed)
PROGRESS NOTE   Subjective/Complaints:  Pt denies orthostatsis- he does report not knowing BP was on low side 60-70's yesterday afternoon.  LBM 7/6- was small- declined sorbitol today.  Feels like needs to go.   They added midodrine yesterday for low BP and we held atenolol this AM  ROS:   Pt denies SOB, abd pain, CP, N/V/C/D, and vision changes  Except for HPI  Objective:   No results found. Recent Labs    09/27/22 0548 09/29/22 0656  WBC 13.2* 13.1*  HGB 7.7* 7.8*  HCT 24.7* 25.0*  PLT 274 267   Recent Labs    09/27/22 0548 09/29/22 0656  NA 128* 127*  K 5.0 5.3*  CL 88* 90*  CO2 25 23  GLUCOSE 115* 97  BUN 75* 68*  CREATININE 8.46* 7.87*  CALCIUM 8.2* 8.3*    Intake/Output Summary (Last 24 hours) at 09/29/2022 1302 Last data filed at 09/29/2022 0837 Gross per 24 hour  Intake 240 ml  Output --  Net 240 ml        Physical Exam: Vital Signs Blood pressure (!) 90/53, pulse 90, temperature 98.9 F (37.2 C), temperature source Oral, resp. rate 20, height 5\' 11"  (1.803 m), weight 74.9 kg, SpO2 96 %.    General: awake, alert, appropriate, supine in bed; NAD HENT: conjugate gaze; oropharynx moist CV: regular rate; no JVD Pulmonary: CTA B/L; no W/R/R- good air movement GI: soft, NT, ND, (+)BS- hypoactive Psychiatric: appropriate Neurological: Ox3 Skin-0 L HD catheter- notable- looks OK PRIOR EXAMS: Skin: + L upper chest HD catheter - dressing c/d/i + Nonpainful erythematous lesions on bilateral feet  MSK:      No apparent deformity.      Strength: Exam limited d/t being on HD; all 4 limbs equal, antigravity and against resistance   Neurologic exam:  Cognition: AAO to person, place, time and event.  + Mild lethargy - intermittently falling asleep during late dialysis; more awake in early exam Language: Fluent, No substitutions or neoglisms. No dysarthria. Names 3/3 objects correctly.   Memory: Recalls 3/3 objects at 5 minutes. No apparent deficits  Insight: Good  insight into current condition.  Mood: Pleasant affect, appropriate mood.  Sensation: To light touch intact in BL UEs and LEs  Reflexes: 1+ in BL UE and LEs. Negative Hoffman's and babinski signs bilaterally.  CN: 2-12 grossly intact.  Coordination: No apparent tremors. No ataxia on FTN, HTS bilaterally.  Spasticity: MAS 0 in all extremities.       Assessment/Plan: 1. Functional deficits which require 3+ hours per day of interdisciplinary therapy in a comprehensive inpatient rehab setting. Physiatrist is providing close team supervision and 24 hour management of active medical problems listed below. Physiatrist and rehab team continue to assess barriers to discharge/monitor patient progress toward functional and medical goals  Care Tool:  Bathing    Body parts bathed by patient: Right arm, Left arm, Chest, Abdomen (UB bathing only)         Bathing assist Assist Level: Moderate Assistance - Patient 50 - 74%     Upper Body Dressing/Undressing Upper body dressing   What is the patient wearing?: Pull over shirt  Upper body assist Assist Level: Moderate Assistance - Patient 50 - 74%    Lower Body Dressing/Undressing Lower body dressing            Lower body assist Assist for lower body dressing: Moderate Assistance - Patient 50 - 74%     Toileting Toileting    Toileting assist Assist for toileting: Minimal Assistance - Patient > 75%     Transfers Chair/bed transfer  Transfers assist     Chair/bed transfer assist level: Minimal Assistance - Patient > 75%     Locomotion Ambulation   Ambulation assist      Assist level: Minimal Assistance - Patient > 75% Assistive device: Walker-rolling Max distance: 170'   Walk 10 feet activity   Assist     Assist level: Minimal Assistance - Patient > 75% Assistive device: Walker-rolling   Walk 50 feet activity   Assist     Assist level: Minimal Assistance - Patient > 75% Assistive device: Walker-rolling    Walk 150 feet activity   Assist    Assist level: Minimal Assistance - Patient > 75% Assistive device: Walker-rolling    Walk 10 feet on uneven surface  activity   Assist Walk 10 feet on uneven surfaces activity did not occur: Safety/medical concerns         Wheelchair     Assist Is the patient using a wheelchair?: Yes Type of Wheelchair: Manual    Wheelchair assist level: Dependent - Patient 0% (pain limits)      Wheelchair 50 feet with 2 turns activity    Assist        Assist Level: Dependent - Patient 0%   Wheelchair 150 feet activity     Assist      Assist Level: Dependent - Patient 0%   Blood pressure (!) 90/53, pulse 90, temperature 98.9 F (37.2 C), temperature source Oral, resp. rate 20, height 5\' 11"  (1.803 m), weight 74.9 kg, SpO2 96 %.  Medical Problem List and Plan: 1. Functional deficits secondary to lumbar osteomyelitis/diskitis d/t MSSA bacteremia.heart vegetations/Endocarditis              -patient may shower             -ELOS/Goals: PT/OT/SLP supervision to mod I, 10-14 days  -Con't CIR PT and OT 2.  Antithrombotics: -DVT/anticoagulation:  Pharmaceutical: Heparin 5000 q8h             -antiplatelet therapy: N/A 3. Low back pain/Pain Management: tylenol, Oxycodone prn. Advised to use oxycodone prior to sleep --will schedule low dose robaxin 250mg  bid as reports ongoing lower back and LE spasms.  -7/5- will increase Oxy to 10-15 mg q4 hours prn-can add long acting over weekend if necessary -7/6-7/24 pain seems controlled for now, monitor 7/8- pt reports pain doing much better- con't regimen 4. Mood/Behavior/Sleep: LCSW to follow for evaluation and support.              -antipsychotic agents: N/A --acute on chronic insomnia in part due to anxiety. Will schedule low dose trazodone. Continue prn melatonin -7/5- will change trazodone to 75 mg  at bedtime- and can be titrated more over weekend if needed.  -7/6-7/24 slept well, monitor 5. Neuropsych/cognition: This patient is capable of making decisions on his own behalf. 6. Skin/Wound Care: Routine pressure relief measures -continue to monitor new lesions on bilateral feet for evolution/progress   7. Fluids/Electrolytes/Nutrition: Strict I/O. Daily weights. Labs TTS with HD. Add low salt restrictions             --  continue Nephro supplements 8. 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  --IV cefazolin with HD. EOT 11/04/22              --Fevers have resolved. CRP-44/Sed rate 1.5 @ admission.  --Leucocytosis trending down from 23.9-->31.4-->13.9.    -7/5- Pt's WBC down to 13/9- will recheck labs in AM as well as qmonday -09/27/22 WBC 13.2, cont to monitor  7/8- WBC stable at 13k- CRP 17.3 and ESR >140- will call ID- they said ESR not real helpful in ESRD pt's- so to make sure CRP improving and pt looks good, to call them back before pt leaves.  Called, since ESR rising- last checked 3 weeks ago and WBC won't resolve.  9. SBO w/ enteritis: Has resolved. Surgery team signed off 10. ESRD: HD TTS at the end of the day to help with tolerance of therapy.              --strict I/O with daily weights. Continues to require 4 L oxygen per [place].  --On Liberalized diet but 1200 cc FR/4 gram salt.  Velphoro for elevated phos level. -7/5- Na down to 128- per Renal  -09/27/22 dialysis today 7/8- K+_ 5.3- this AM and Na 127- per renal to intervene 11. Anemia of chronic disease: On Aranesp weekly every Thursday w/HD.  12. HTN: Monitor BP TID. On low dose tenormin 12.5mg  every day but BP with drops during HD.  -7/5- BP running low 90s systolic when laying down- I'm concerned will have orthostatic hypotension-will check with PT/OT  -09/27/22 BPs soft, monitor closely -09/28/22 BP a  little softer today, hold atenolol this morning, monitor to see if changes need to be made to that. Denies orthostatic symptoms.  7/8- held atenolol- BP dropped to 60-70's systolic yesterday- was 90s SBP this AM- will monitor- midodrine increased yesterday to 12.5 mg  Vitals:   09/27/22 1744 09/27/22 1803 09/27/22 2133 09/28/22 0515  BP: 97/63 120/73 (!) 100/59 (!) 83/57   09/28/22 0900 09/28/22 1317 09/28/22 1318 09/28/22 1500  BP: (!) 81/62 (!) 67/54 (!) 91/58 (!) 97/53   09/28/22 1944 09/29/22 0612 09/29/22 0754 09/29/22 1100  BP: 105/64 (!) 91/58 99/60 (!) 90/53    13. AHRF d/t cavitary pneumonia. Wean oxygen as able. Currently 2-4L; per patient not using CPAP/BiPAP   -7/5- will wean O2 as allowed  -09/27/22 no O2 on today, monitor 14. Constipation:  -09/28/22 had a small BM yesterday, lots of gas, will do dose of miralax now then add on QD PRN dose, advised to take senna with the miralax dose now, then could use the PRN miralax dose after lunch if still not successful-- if not working, then use suppository +/- enema.  7/8- LBM  7/6- small- feels like needs to go- wants to wait on more intervention.    I spent a total of 54   minutes on total care today- >50% coordination of care- due to  D/w nursing at length about his BP- was 90's systolic this AM- decided to hold atenolol- also called ID about WBC and ESR-   LOS: 4 days A FACE TO FACE EVALUATION WAS PERFORMED  Joyleen Haselton 09/29/2022, 1:02 PM

## 2022-09-29 NOTE — Progress Notes (Signed)
Physical Therapy Session Note  Patient Details  Name: LANSON ESBENSHADE MRN: [id number] Date of Birth: [birth year]  Today's Date: 09/29/2022 PT Individual Time: 1105-1131 PT Individual Time Calculation (min): 26 min   Short Term Goals: Week 1:  PT Short Term Goal 1 (Week 1): Pt will complete sit<>stand transfer with CGA and LRAD PT Short Term Goal 2 (Week 1): Pt will ambulate 200' with LRAD CGA PT Short Term Goal 3 (Week 1): Pt will complete bed mobility CGA without hospital bed features PT Short Term Goal 4 (Week 1): Pt will complete up/down 1 curb step with RW CGA  Skilled Therapeutic Interventions/Progress Updates:      Therapy Documentation Precautions:  Precautions Precautions: Fall Restrictions Weight Bearing Restrictions: No   Pt agreeable to PT session and without reports of pain. Pt without ted hose or ace wraps and vitals assessed with BP 89/53 (65). Nurse notified and PT applied ted hose and bilateral ace wrapping for BP management. PT re-assessed vitals 90/55 (66) following application and encouraged pt to have on for all mobility due to orthostatic hypotension with mobility, pt agreeable.     Therapy/Group: Individual Therapy  Truitt Leep 09/29/2022, 5:49 AM

## 2022-09-29 NOTE — Progress Notes (Signed)
Physical Therapy Session Note  Patient Details  Name: Ryan [name] MRN: [id number] Date of Birth: [birth year]  Today's Date: 09/29/2022 PT Missed Time: 60 Minutes Missed Time Reason: Patient fatigue  Short Term Goals: Week 1:  PT Short Term Goal 1 (Week 1): Pt will complete sit<>stand transfer with CGA and LRAD PT Short Term Goal 2 (Week 1): Pt will ambulate 200' with LRAD CGA PT Short Term Goal 3 (Week 1): Pt will complete bed mobility CGA without hospital bed features PT Short Term Goal 4 (Week 1): Pt will complete up/down 1 curb step with RW CGA  Skilled Therapeutic Interventions/Progress Updates:      Therapy Documentation Precautions:  Precautions Precautions: Fall Restrictions Weight Bearing Restrictions: No  Pt missed 60 minutes of skilled PT due to fatigue from previous session.    Therapy/Group: Individual Therapy  Truitt Leep 09/29/2022, 4:12 PM

## 2022-09-29 NOTE — Plan of Care (Signed)
  Problem: Consults Goal: RH GENERAL PATIENT EDUCATION Description: See Patient Education module for education specifics. Outcome: Progressing   Problem: RH BOWEL ELIMINATION Goal: RH STG MANAGE BOWEL WITH ASSISTANCE Description: STG Manage Bowel with min Assistance. Outcome: Progressing Goal: RH STG MANAGE BOWEL W/MEDICATION W/ASSISTANCE Description: STG Manage Bowel with Medication with min Assistance. Outcome: Progressing   Problem: RH BLADDER ELIMINATION Goal: RH STG MANAGE BLADDER WITH ASSISTANCE Description: STG Manage Bladder With min Assistance Outcome: Progressing   Problem: RH SKIN INTEGRITY Goal: RH STG SKIN FREE OF INFECTION/BREAKDOWN Description: Skin will remain intact and be free of infection/breakdown with min assist Outcome: Progressing   Problem: RH SAFETY Goal: RH STG ADHERE TO SAFETY PRECAUTIONS W/ASSISTANCE/DEVICE Description: STG Adhere to Safety Precautions With cueing Assistance/Device. Outcome: Progressing Goal: RH STG DECREASED RISK OF FALL WITH ASSISTANCE Description: STG Decreased Risk of Fall With min Assistance. Outcome: Progressing   Problem: RH PAIN MANAGEMENT Goal: RH STG PAIN MANAGED AT OR BELOW PT'S PAIN GOAL Description: Pain will be managed 4 out of 10 on pain scale with PRN medications min assist Outcome: Progressing   Problem: RH KNOWLEDGE DEFICIT GENERAL Goal: RH STG INCREASE KNOWLEDGE OF SELF CARE AFTER HOSPITALIZATION Description: Patient/caregiver will be able to manage medications, diet modifications to improve HDL and A1C levels, and be able to administer IV antibiotics at home if needed at discharge from nursing education, nursing handouts, and other resources independently Outcome: Progressing   Problem: Education: Goal: Knowledge of General Education information will improve Description: Including pain rating scale, medication(s)/side effects and non-pharmacologic comfort measures Outcome: Progressing   Problem: Health  Behavior/Discharge Planning: Goal: Ability to manage health-related needs will improve Outcome: Progressing   Problem: Clinical Measurements: Goal: Ability to maintain clinical measurements within normal limits will improve Outcome: Progressing Goal: Will remain free from infection Outcome: Progressing Goal: Diagnostic test results will improve Outcome: Progressing Goal: Respiratory complications will improve Outcome: Progressing Goal: Cardiovascular complication will be avoided Outcome: Progressing   Problem: Activity: Goal: Risk for activity intolerance will decrease Outcome: Progressing   Problem: Nutrition: Goal: Adequate nutrition will be maintained Outcome: Progressing   Problem: Coping: Goal: Level of anxiety will decrease Outcome: Progressing   Problem: Elimination: Goal: Will not experience complications related to bowel motility Outcome: Progressing Goal: Will not experience complications related to urinary retention Outcome: Progressing   Problem: Pain Managment: Goal: General experience of comfort will improve Outcome: Progressing   Problem: Safety: Goal: Ability to remain free from injury will improve Outcome: Progressing   Problem: Skin Integrity: Goal: Risk for impaired skin integrity will decrease Outcome: Progressing

## 2022-09-29 NOTE — Progress Notes (Addendum)
[facility] Progress Note   Subjective: Seen in room. No specific complaints. Says he is feeling better day by day. HD tomorrow on schedule.      Objective Vitals:   09/28/22 1944 09/29/22 0612 09/29/22 0754 09/29/22 1100  BP: 105/64 (!) 91/58 99/60 (!) 90/53  Pulse: 90 84 90   Resp: 16 16 20    Temp: 98.9 F (37.2 C)  98.9 F (37.2 C)   TempSrc:   Oral   SpO2: 95% 97% 96%   Weight:  74.9 kg    Height:       Physical Exam General: Chronically ill appearing older male in NAD Heart: S1,S2 No M/R/G Lungs: CTAB A/P Abdomen: NABS, NT Extremities: No LE edema Dialysis Access: [facility] intact    Additional Objective Labs: Basic Metabolic Panel: Recent Labs  Lab 09/23/22 0105 09/24/22 0022 09/27/22 0548 09/29/22 0656  NA 130* 128* 128* 127*  K 4.3 4.2 5.0 5.3*  CL 92* 90* 88* 90*  CO2 20* 26 25 23   GLUCOSE 111* 133* 115* 97  BUN 55* 38* 75* 68*  CREATININE 7.70* 5.05* 8.46* 7.87*  CALCIUM 7.7* 8.3* 8.2* 8.3*  PHOS 7.5*  --  5.1* 4.4   Liver Function Tests: Recent Labs  Lab 09/24/22 0022 09/27/22 0548 09/29/22 0656  AST 75*  --   --   ALT 10  --   --   ALKPHOS 105  --   --   BILITOT 0.6  --   --   PROT 7.8  --   --   ALBUMIN 2.2* 2.0* 2.0*   No results for input(s): "LIPASE", "AMYLASE" in the last 168 hours. CBC: Recent Labs  Lab 09/23/22 0105 09/24/22 0022 09/27/22 0548 09/29/22 0656  WBC 17.5* 13.9* 13.2* 13.1*  NEUTROABS  --   --  10.9* 10.7*  HGB 7.6* 8.0* 7.7* 7.8*  HCT 22.6* 24.8* 24.7* 25.0*  MCV 79.0* 80.8 83.2 83.3  PLT 285 315 274 267   Blood Culture    Component Value Date/Time   SDES BLOOD RIGHT ANTECUBITAL 09/10/2022 1516   SDES BLOOD LEFT ARM 09/10/2022 1516   SPECREQUEST  09/10/2022 1516    BOTTLES DRAWN AEROBIC AND ANAEROBIC Blood Culture adequate volume   SPECREQUEST  09/10/2022 1516    BOTTLES DRAWN AEROBIC AND ANAEROBIC Blood Culture adequate volume   CULT  09/10/2022 1516    NO GROWTH 5  DAYS Performed at [facility], [address] [place], [place] [address]    CULT  09/10/2022 1516    NO GROWTH 5 DAYS Performed at [facility], [address] [place], [place] [address]    REPTSTATUS 09/15/2022 FINAL 09/10/2022 1516   REPTSTATUS 09/15/2022 FINAL 09/10/2022 1516    Cardiac Enzymes: No results for input(s): "CKTOTAL", "CKMB", "CKMBINDEX", "TROPONINI" in the last 168 hours. CBG: Recent Labs  Lab 09/29/22 0602  GLUCAP 102*   Iron Studies: No results for input(s): "IRON", "TIBC", "TRANSFERRIN", "FERRITIN" in the last 72 hours. @lablastinr3 @ Studies/Results: No results found. Medications:  [START ON 09/30/2022]  ceFAZolin (ANCEF) IV      ceFAZolin (ANCEF) IV 3 g (09/28/22 0123)    atenolol  12.5 mg Oral Daily   calcitRIOL  0.5 mcg Oral Q T,Th,Sat-1800   Chlorhexidine Gluconate Cloth  6 each Topical Q12H   cinacalcet  30 mg Oral Q supper   darbepoetin (ARANESP) injection - DIALYSIS  100 mcg Subcutaneous Q Thu-1800   feeding supplement (NEPRO CARB STEADY)  237 mL Oral BID BM   heparin  5,000 Units Subcutaneous Q8H   methocarbamol  250 mg Oral BID   midodrine  2.5 mg Oral BID WC   multivitamin  1 tablet Oral QHS   pantoprazole  40 mg Oral Daily   sucroferric oxyhydroxide  1,000 mg Oral TID WC   traZODone  75 mg Oral QHS     Dialysis Orders: [facility] TTS  3:45 450/A1.5x 2K/2Ca EDW 85.7kg TDC  -Heparin 3000 units IV TIW  -No ESA -Calcitriol 1.5 MCG PO TIW   Assessment/Plan: 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 ID following. Blood Cx 6/16 MSSA, negative Cx on 6/17, 6/19. TDC removed 6/18. Completed 72 hour line holiday. TTE concerning for endocarditis. TEE cancelled d/t high risk factors. LS MRI with possible L5-S1 discitis. Temp cath placed 6/21, but poor function requiring exchange - see below. Will be getting IV Cefazolin 2g q HD x 6 weeks (until 11/04/22). Dialysis Access: RIJ TDC removed 6/18 - s/p line  holiday, then temp line placed 6/21 with recurrent non-function issues despite exchange 6/26. S/p [facility] 7/1 with IR.  SBO - on CT Ab 6/19. Now resolved, surgery team has signed off. Hyperkalemia: Improved with HD. ESRD: Usual TTS schedule - then line issues preventing HD 6/27 -7/1. Now resolved and HD 7/1, 7/2 to correct. Tolerated HD Th w/ 3.5L net UF and 3L on 7/6. Appears to be euvolemic today; EDW is likely ~75.2-76kg.   Next HD 09/30/2022 Perm Access: S/p L AVF ligation in 2019. Last seen by Dr. Edilia Bo 01/2022 - limited mobility in R arm so planning for L arm graft but had not scheduled surgery yet.   Hypertension/volume: BP better, Not overtly volume overloaded by exam. UF as tolerated. Will need lower EDW on discharge. Anemia of ESRD: Hgb 7.8- continue Aranesp q Thursday, have increased next dose. Metabolic bone disease: CorrCa ok, Phos high. Previously on Renvela as binder - stopped due to SBO, changed to Velphoro - have ^d dose to 2/meals.  Nutrition - Renal diet, Alb low - continue supplements Dispo - CIR    Lenice Koper H. Adalbert Alberto NP-C 09/29/2022, 12:16 PM  [employer] [phone]

## 2022-09-30 LAB — BASIC METABOLIC PANEL
Anion gap: 16 — ABNORMAL HIGH (ref 5–15)
BUN: 94 mg/dL — ABNORMAL HIGH (ref 8–23)
CO2: 20 mmol/L — ABNORMAL LOW (ref 22–32)
Calcium: 8.5 mg/dL — ABNORMAL LOW (ref 8.9–10.3)
Chloride: 90 mmol/L — ABNORMAL LOW (ref 98–111)
Creatinine, Ser: 10.14 mg/dL — ABNORMAL HIGH (ref 0.61–1.24)
GFR, Estimated: 5 mL/min — ABNORMAL LOW (ref >60)
Glucose, Bld: 105 mg/dL — ABNORMAL HIGH (ref 70–99)
Potassium: 6 mmol/L — ABNORMAL HIGH (ref 3.5–5.1)
Sodium: 126 mmol/L — ABNORMAL LOW (ref 135–145)

## 2022-09-30 LAB — CBC WITH DIFFERENTIAL/PLATELET
Abs Immature Granulocytes: 0.12 10*3/uL — ABNORMAL HIGH (ref 0.00–0.07)
Basophils Absolute: 0 10*3/uL (ref 0.0–0.1)
Basophils Relative: 0 %
Eosinophils Absolute: 0.3 10*3/uL (ref 0.0–0.5)
Eosinophils Relative: 2 %
HCT: 26.1 % — ABNORMAL LOW (ref 39.0–52.0)
Hemoglobin: 8.2 g/dL — ABNORMAL LOW (ref 13.0–17.0)
Immature Granulocytes: 1 %
Lymphocytes Relative: 5 %
Lymphs Abs: 0.7 10*3/uL (ref 0.7–4.0)
MCH: 25.9 pg — ABNORMAL LOW (ref 26.0–34.0)
MCHC: 31.4 g/dL (ref 30.0–36.0)
MCV: 82.3 fL (ref 80.0–100.0)
Monocytes Absolute: 1.2 10*3/uL — ABNORMAL HIGH (ref 0.1–1.0)
Monocytes Relative: 9 %
Neutro Abs: 10.6 10*3/uL — ABNORMAL HIGH (ref 1.7–7.7)
Neutrophils Relative %: 83 %
Platelets: 283 10*3/uL (ref 150–400)
RBC: 3.17 MIL/uL — ABNORMAL LOW (ref 4.22–5.81)
RDW: 15.7 % — ABNORMAL HIGH (ref 11.5–15.5)
WBC: 12.9 10*3/uL — ABNORMAL HIGH (ref 4.0–10.5)
nRBC: 0 % (ref 0.0–0.2)

## 2022-09-30 MED ORDER — MIDODRINE HCL 5 MG PO TABS
10.0000 mg | ORAL_TABLET | ORAL | Status: DC
  Administered 2022-10-02: 10 mg via ORAL
  Filled 2022-09-30: qty 2

## 2022-09-30 MED ORDER — HEPARIN SODIUM (PORCINE) 1000 UNIT/ML IJ SOLN
INTRAMUSCULAR | Status: AC
  Administered 2022-09-30: 1000 [IU]
  Filled 2022-09-30: qty 4

## 2022-09-30 MED ORDER — OXYCODONE HCL 5 MG PO TABS
7.5000 mg | ORAL_TABLET | ORAL | Status: DC | PRN
  Administered 2022-09-30 – 2022-10-07 (×12): 10 mg via ORAL
  Filled 2022-09-30 (×13): qty 2

## 2022-09-30 NOTE — Progress Notes (Signed)
   09/30/22 1740  Vitals  Temp 97.8 F (36.6 C)  Temp Source Oral  BP 92/60  BP Location Right Wrist  BP Method Automatic  Patient Position (if appropriate) Lying  During Treatment Monitoring  Intra-Hemodialysis Comments Tx completed  Post Treatment  Dialyzer Clearance Lightly streaked  Duration of HD Treatment -hour(s) 3.75 hour(s)  Hemodialysis Intake (mL) 0 mL  Liters Processed 89  Fluid Removed (mL) 1900 mL  Tolerated HD Treatment Yes  Post-Hemodialysis Comments Pt goal met.  Hemodialysis Catheter Left Subclavian Double lumen Permanent (Tunneled)  Placement Date/Time: 09/22/22 1526   Serial / Lot #: [id number]  Expiration Date: 05/22/27  Time Out: Correct site;Correct patient;Correct procedure  Maximum sterile barrier precautions: Hand hygiene;Cap;Mask;Sterile gown;Sterile gloves;Large sterile sh...  Site Condition No complications  Blue Lumen Status Heparin locked  Red Lumen Status Heparin locked  Purple Lumen Status Occluded;N/A  Catheter fill solution Heparin 1000 units/ml  Catheter fill volume (Arterial) 1.9 cc  Catheter fill volume (Venous) 1.9  Dressing Type Transparent  Dressing Status Antimicrobial disc in place;Clean, Dry, Intact  Interventions Dressing changed  Drainage Description None  Dressing Change Due 10/07/22  Post treatment catheter status Capped and Clamped

## 2022-09-30 NOTE — Progress Notes (Signed)
Pt's out-pt HD clinic made aware of pt's target d/c date of Friday, July 19. Will assist as needed.   Olivia Canter Renal Navigator [phone]

## 2022-09-30 NOTE — Progress Notes (Signed)
Patient ID: Ryan [name], male   DOB: 03-29-1960, 62 y.o.   MRN: [id number]  Met with pt and spoke with sister-Jacqueline via telephone to discuss team conference goals of mod/I level and target discharge date of 7/19. Sister reports her niece will be staying with pt and working form home, so someone is there. Discussed his fatigue level and working on this. Pt hopeful he will gain strength and get more endurance by discharge date. Discussed [facility] referral was made while on acute and they will provide PT OT RN services. He is currently getting his IV antibiotics in HD will make sure this continues and he does not need daily. Will await team's recommendations for equipment and work on other discharge needs.

## 2022-09-30 NOTE — Progress Notes (Signed)
PROGRESS NOTE   Subjective/Complaints:  Pt reports feeling good- denied orthostasis yesterday Slept great LBM yesterday- "good sized"- denies constipation, but feels like could go again this AM.   Wants me to call his sister Prescott Parma   ROS:   Pt denies SOB, abd pain, CP, N/V/C/D, and vision changes   Except for HPI  Objective:   No results found. Recent Labs    09/29/22 0656  WBC 13.1*  HGB 7.8*  HCT 25.0*  PLT 267   Recent Labs    09/29/22 0656  NA 127*  K 5.3*  CL 90*  CO2 23  GLUCOSE 97  BUN 68*  CREATININE 7.87*  CALCIUM 8.3*    Intake/Output Summary (Last 24 hours) at 09/30/2022 0903 Last data filed at 09/29/2022 1315 Gross per 24 hour  Intake 200 ml  Output --  Net 200 ml        Physical Exam: Vital Signs Blood pressure 104/67, pulse 88, temperature 98.6 F (37 C), resp. rate 16, height 5\' 11"  (1.803 m), weight 74.7 kg, SpO2 94 %.     General: awake, alert, appropriate, sitting up I bed; OT in room; NAD HENT: conjugate gaze; oropharynx moist CV: regular rate and rhythm; no JVD Pulmonary: CTA B/L; no W/R/R- good air movement GI: soft, NT, ND, (+)BS- more normoactive Psychiatric: appropriate- smiling Neurological: Ox3  PRIOR EXAMS: Skin: + L upper chest HD catheter - dressing c/d/i + Nonpainful erythematous lesions on bilateral feet  MSK:      No apparent deformity.      Strength: Exam limited d/t being on HD; all 4 limbs equal, antigravity and against resistance   Neurologic exam:  Cognition: AAO to person, place, time and event.  + Mild lethargy - intermittently falling asleep during late dialysis; more awake in early exam Language: Fluent, No substitutions or neoglisms. No dysarthria. Names 3/3 objects correctly.  Memory: Recalls 3/3 objects at 5 minutes. No apparent deficits  Insight: Good  insight into current condition.  Mood: Pleasant affect, appropriate mood.   Sensation: To light touch intact in BL UEs and LEs  Reflexes: 1+ in BL UE and LEs. Negative Hoffman's and babinski signs bilaterally.  CN: 2-12 grossly intact.  Coordination: No apparent tremors. No ataxia on FTN, HTS bilaterally.  Spasticity: MAS 0 in all extremities.       Assessment/Plan: 1. Functional deficits which require 3+ hours per day of interdisciplinary therapy in a comprehensive inpatient rehab setting. Physiatrist is providing close team supervision and 24 hour management of active medical problems listed below. Physiatrist and rehab team continue to assess barriers to discharge/monitor patient progress toward functional and medical goals  Care Tool:  Bathing    Body parts bathed by patient: Right arm, Left arm, Chest, Abdomen (UB bathing only)         Bathing assist Assist Level: Moderate Assistance - Patient 50 - 74%     Upper Body Dressing/Undressing Upper body dressing   What is the patient wearing?: Pull over shirt    Upper body assist Assist Level: Moderate Assistance - Patient 50 - 74%    Lower Body Dressing/Undressing Lower body dressing  Lower body assist Assist for lower body dressing: Moderate Assistance - Patient 50 - 74%     Toileting Toileting    Toileting assist Assist for toileting: Minimal Assistance - Patient > 75%     Transfers Chair/bed transfer  Transfers assist     Chair/bed transfer assist level: Minimal Assistance - Patient > 75%     Locomotion Ambulation   Ambulation assist      Assist level: Minimal Assistance - Patient > 75% Assistive device: Walker-rolling Max distance: 170'   Walk 10 feet activity   Assist     Assist level: Minimal Assistance - Patient > 75% Assistive device: Walker-rolling   Walk 50 feet activity   Assist    Assist level: Minimal Assistance - Patient > 75% Assistive device: Walker-rolling    Walk 150 feet activity   Assist    Assist level: Minimal  Assistance - Patient > 75% Assistive device: Walker-rolling    Walk 10 feet on uneven surface  activity   Assist Walk 10 feet on uneven surfaces activity did not occur: Safety/medical concerns         Wheelchair     Assist Is the patient using a wheelchair?: Yes Type of Wheelchair: Manual    Wheelchair assist level: Dependent - Patient 0% (pain limits)      Wheelchair 50 feet with 2 turns activity    Assist        Assist Level: Dependent - Patient 0%   Wheelchair 150 feet activity     Assist      Assist Level: Dependent - Patient 0%   Blood pressure 104/67, pulse 88, temperature 98.6 F (37 C), resp. rate 16, height 5\' 11"  (1.803 m), weight 74.7 kg, SpO2 94 %.  Medical Problem List and Plan: 1. Functional deficits secondary to lumbar osteomyelitis/diskitis d/t MSSA bacteremia.heart vegetations/Endocarditis              -patient may shower             -ELOS/Goals: PT/OT/SLP supervision to mod I, 10-14 days  -con't CIR PT and OT  Team conference today to determine length of stay  Will call sister to discuss pt's care.  2.  Antithrombotics: -DVT/anticoagulation:  Pharmaceutical: Heparin 5000 q8h             -antiplatelet therapy: N/A 3. Low back pain/Pain Management: tylenol, Oxycodone prn. Advised to use oxycodone prior to sleep --will schedule low dose robaxin 250mg  bid as reports ongoing lower back and LE spasms.  -7/5- will increase Oxy to 10-15 mg q4 hours prn-can add long acting over weekend if necessary -7/6-7/24 pain seems controlled for now, monitor 7/8-7/9 pt reports pain doing much better- con't regimen 4. Mood/Behavior/Sleep: LCSW to follow for evaluation and support.              -antipsychotic agents: N/A --acute on chronic insomnia in part due to anxiety. Will schedule low dose trazodone. Continue prn melatonin -7/5- will change trazodone to 75 mg at bedtime- and can be titrated more over weekend if needed.  7/9- sleeping well 5.  Neuropsych/cognition: This patient is capable of making decisions on his own behalf. 6. Skin/Wound Care: Routine pressure relief measures -continue to monitor new lesions on bilateral feet for evolution/progress   7. Fluids/Electrolytes/Nutrition: Strict I/O. Daily weights. Labs TTS with HD. Add low salt restrictions             --continue Nephro supplements 8. 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  --IV cefazolin with HD. EOT 11/04/22              --Fevers have resolved. CRP-44/Sed rate 1.5 @ admission.  --Leucocytosis trending down from 23.9-->31.4-->13.9.    -7/5- Pt's WBC down to 13/9- will recheck labs in AM as well as qmonday -09/27/22 WBC 13.2, cont to monitor  7/8- WBC stable at 13k- CRP 17.3 and ESR >140- will call ID- they said ESR not real helpful in ESRD pt's- so to make sure CRP improving and pt looks good, to call them back before pt leaves.  Called, since ESR rising- last checked 3 weeks ago and WBC won't resolve. 7/9- ID ok with labs since ESR not a good indicator in ESRD- follow labs weekly.   9. SBO w/ enteritis: Has resolved. Surgery team signed off 10. ESRD: HD TTS at the end of the day to help with tolerance of therapy.              --strict I/O with daily weights. Continues to require 4 L oxygen per [place].  --On Liberalized diet but 1200 cc FR/4 gram salt.  Velphoro for elevated phos level. -7/5- Na down to 128- per Renal  -09/27/22 dialysis today 7/9- K+_ 5.3- this AM and Na 127- per renal to intervene- they will likely check labs and follow up 11. Anemia of chronic disease: On Aranesp weekly every Thursday w/HD.  12. HTN: Monitor BP TID. On low dose tenormin 12.5mg  every day but BP with drops during HD.  -7/5- BP running low 90s systolic when laying down- I'm concerned will have orthostatic hypotension-will check with PT/OT  -09/27/22 BPs soft,  monitor closely -09/28/22 BP a little softer today, hold atenolol this morning, monitor to see if changes need to be made to that. Denies orthostatic symptoms.  7/8- held atenolol- BP dropped to 60-70's systolic yesterday- was 90s SBP this AM- will monitor- midodrine increased yesterday to 2.5 mg 7/9- will hold atenolol since BP 104 systolic and going to HD today.   Vitals:   09/27/22 2133 09/28/22 0515 09/28/22 0900 09/28/22 1317  BP: (!) 100/59 (!) 83/57 (!) 81/62 (!) 67/54   09/28/22 1318 09/28/22 1500 09/28/22 1944 09/29/22 0612  BP: (!) 91/58 (!) 97/53 105/64 (!) 91/58   09/29/22 0754 09/29/22 1100 09/29/22 1946 09/30/22 0533  BP: 99/60 (!) 90/53 101/63 104/67    13. AHRF d/t cavitary pneumonia. Wean oxygen as able. Currently 2-4L; per patient not using CPAP/BiPAP   -7/5- will wean O2 as allowed  -09/27/22 no O2 on today, monitor 14. Constipation:  -09/28/22 had a small BM yesterday, lots of gas, will do dose of miralax now then add on QD PRN dose, advised to take senna with the miralax dose now, then could use the PRN miralax dose after lunch if still not successful-- if not working, then use suppository +/- enema.  7/8- LBM  7/6- small- feels like needs to go- wants to wait on more intervention.   7/9- Had good sized BM yesterday- feeling better   I spent a total of  56  minutes on total care today- >50% coordination of care- due to  Team conference today as well as calling family to give update on how pt is doing.  Also d/w OT to get pt's sister's number  LOS: 5 days A FACE TO FACE EVALUATION WAS PERFORMED  Aden Sek 09/30/2022, 9:03 AM

## 2022-09-30 NOTE — Progress Notes (Signed)
Occupational Therapy Session Note  Patient Details  Name: Ryan [name] MRN: [id number] Date of Birth: 11/27/1960  Today's Date: 09/30/2022 OT Individual Time: 5366-4403 OT Individual Time Calculation (min): 74 min    Short Term Goals: Week 1:  OT Short Term Goal 1 (Week 1): Pt will complete bathing at CGA in shower OT Short Term Goal 2 (Week 1): Pt will complete AE training to demonstrate LB dressing CGA OT Short Term Goal 3 (Week 1): Pt will complete standing activity with tolerance >5 minutes  Skilled Therapeutic Interventions/Progress Updates:      Therapy Documentation Precautions:  Precautions Precautions: Fall Restrictions Weight Bearing Restrictions: No General:  "Hi Adella Manolis!" Pt supine in bed upon OT arrival, agreeable to OT session.  Pain: 0/10 pain reported/noted   ADL: Bed mobility: SBA supine><sit with increased time to complete d/t pt fear of lower back spasms and increased processing time, vc for initiation Eating: set-up bed level Grooming: SBA seated in W/C at sink to rinse face at sink with increased time for processing Oral hygiene: SBA seated in W/C at sink to brush teeth, able to manipulate containers  Toilet transfer: CGA with RW for ambulation ~15 feet into bathroom  Toileting: CGA for standing to complete BM hygiene, able to manage pants UB dressing: SBA seated in W/C for doffing/donning overhead shirt LB dressing: Min A for doffing/donning, with assistance for use of reacher to don over feet Footwear: total A for TED hose and socks seated in W/C Shower transfer: CGA with RW ambulating ~2 ft from toilet  Bathing: CGA, lateral leaning to clean peri area   Pt supine in bed with bed alarm activated, 2 bed rails up, call light within reach and 4Ps assessed.   Therapy/Group: Individual Therapy  Velia Meyer, OTD, OTR/L 09/30/2022, 4:29 PM

## 2022-09-30 NOTE — Progress Notes (Signed)
Speech Language Pathology Daily Session Note  Patient Details  Name: Ryan [name] MRN: [id number] Date of Birth: 1960/10/05  Today's Date: 09/30/2022 SLP Individual Time: 9629-5284 SLP Individual Time Calculation (min): 39 min and Today's Date: 09/30/2022 SLP Missed Time: 21 Minutes  Missed Time Reason:  fatigue  Short Term Goals: Week 1: SLP Short Term Goal 1 (Week 1): Patient will demonstrate complex problem solving for functional and familiar tasks with supervision level verbal cues. SLP Short Term Goal 2 (Week 1): Patient will recall new, daily information with supervision level verbal cues for use of compensatory strategies.  Skilled Therapeutic Interventions:   Pt seen for skilled SLP session to address cognitive-linguistic goals. Pt was challenged to maintain alertness throughout session and needed frequent cues to keep from drifting to sleep. He was motivated to attempt to stay awake, though appeared to be unable to help his lethargy. Missed 21 minutes due to pt fatigue. Pt completed visual safety awareness task with min-mod cues to identify all pictured safety hazards and problem solve solutions for each hazard. Pt completed money counting task with >85% accuracy given additional processing time. Pt able to correct error with min cues. Pt left sitting upright in bed with call bell in reach and bed alarm activated. Continue SLP PoC.   Pain Pain Assessment Pain Scale: 0-10 Pain Score: 0-No pain  Therapy/Group: Individual Therapy  Ellery Plunk 09/30/2022, 11:40 AM

## 2022-09-30 NOTE — Progress Notes (Signed)
Physical Therapy Session Note  Patient Details  Name: Ryan [name] MRN: [id number] Date of Birth: Jan 22, 1961  Today's Date: 09/30/2022 PT Individual Time: 4098-1191 PT Individual Time Calculation (min): 60 min   Short Term Goals: Week 1:  PT Short Term Goal 1 (Week 1): Pt will complete sit<>stand transfer with CGA and LRAD PT Short Term Goal 2 (Week 1): Pt will ambulate 200' with LRAD CGA PT Short Term Goal 3 (Week 1): Pt will complete bed mobility CGA without hospital bed features PT Short Term Goal 4 (Week 1): Pt will complete up/down 1 curb step with RW CGA  Skilled Therapeutic Interventions/Progress Updates:      Therapy Documentation Precautions:  Precautions Precautions: Fall Restrictions Weight Bearing Restrictions: No   Pt received semi-reclined in bed with friend present. Pt with unrated low back pain and requesting mediation. PT notified nurse who administered medication in session. Approximately 10-15 minutes following medication pt displays significant lethargy, nurse and MD notified.   Pt wearing thigh high ted hose, but no ace wraps. PT educated pt to wear both materials for all OOB mobility for BP management. PT dependently donned ace wraps bilaterally and BP recorded as 113/70 (85) in sitting.   Pt requires increased time and CGA with stand pivot with RW to w/c and dependently transported for time management.   Pt navigated curb step CGA ~4 inches with RW with visual demonstration performed by PT and BP 104/64 (76) following activity.   Pt returned to room and requires min A for stand pivot and sit to lying due to increased lethargy.   Pt left semi-reclined in bed with all needs in reach and alarm on.    Therapy/Group: Individual Therapy  Truitt Leep Truitt Leep PT, DPT  09/30/2022, 5:55 AM

## 2022-09-30 NOTE — Progress Notes (Addendum)
[facility] Progress Note   Subjective: Seen on HD. No C/Os. UF as tolerated. Sodium is falling but BP is soft.   Objective Vitals:   09/30/22 1353 09/30/22 1400 09/30/22 1403 09/30/22 1405  BP:  (!) 92/58 (!) 123/56 (!) 92/58  Pulse:  89 85   Resp:  18 16   Temp:      TempSrc:      SpO2:  98% 100%   Weight: 77.4 kg     Height:       Physical Exam General: Chronically ill appearing older male in NAD Heart: S1,S2 No M/R/G Lungs: CTAB A/P Abdomen: NABS, NT Extremities: No LE edema Dialysis Access: [facility] intact   Additional Objective Labs: Basic Metabolic Panel: Recent Labs  Lab 09/27/22 0548 09/29/22 0656 09/30/22 1142  NA 128* 127* 126*  K 5.0 5.3* 6.0*  CL 88* 90* 90*  CO2 25 23 20*  GLUCOSE 115* 97 105*  BUN 75* 68* 94*  CREATININE 8.46* 7.87* 10.14*  CALCIUM 8.2* 8.3* 8.5*  PHOS 5.1* 4.4  --    Liver Function Tests: Recent Labs  Lab 09/24/22 0022 09/27/22 0548 09/29/22 0656  AST 75*  --   --   ALT 10  --   --   ALKPHOS 105  --   --   BILITOT 0.6  --   --   PROT 7.8  --   --   ALBUMIN 2.2* 2.0* 2.0*   No results for input(s): "LIPASE", "AMYLASE" in the last 168 hours. CBC: Recent Labs  Lab 09/24/22 0022 09/27/22 0548 09/29/22 0656 09/30/22 1142  WBC 13.9* 13.2* 13.1* 12.9*  NEUTROABS  --  10.9* 10.7* 10.6*  HGB 8.0* 7.7* 7.8* 8.2*  HCT 24.8* 24.7* 25.0* 26.1*  MCV 80.8 83.2 83.3 82.3  PLT 315 274 267 283   Blood Culture    Component Value Date/Time   SDES BLOOD RIGHT ANTECUBITAL 09/10/2022 1516   SDES BLOOD LEFT ARM 09/10/2022 1516   SPECREQUEST  09/10/2022 1516    BOTTLES DRAWN AEROBIC AND ANAEROBIC Blood Culture adequate volume   SPECREQUEST  09/10/2022 1516    BOTTLES DRAWN AEROBIC AND ANAEROBIC Blood Culture adequate volume   CULT  09/10/2022 1516    NO GROWTH 5 DAYS Performed at [facility], [address], [place], [place] [address]    CULT  09/10/2022 1516    NO GROWTH 5 DAYS Performed at  [facility], [address], [place], [place] [address]    REPTSTATUS 09/15/2022 FINAL 09/10/2022 1516   REPTSTATUS 09/15/2022 FINAL 09/10/2022 1516    Cardiac Enzymes: No results for input(s): "CKTOTAL", "CKMB", "CKMBINDEX", "TROPONINI" in the last 168 hours. CBG: Recent Labs  Lab 09/29/22 0602  GLUCAP 102*   Iron Studies: No results for input(s): "IRON", "TIBC", "TRANSFERRIN", "FERRITIN" in the last 72 hours. @lablastinr3 @ Studies/Results: No results found. Medications:   ceFAZolin (ANCEF) IV      ceFAZolin (ANCEF) IV 3 g (09/28/22 0123)    calcitRIOL  0.5 mcg Oral Q T,Th,Sat-1800   Chlorhexidine Gluconate Cloth  6 each Topical Q12H   cinacalcet  30 mg Oral Q supper   darbepoetin (ARANESP) injection - DIALYSIS  100 mcg Subcutaneous Q Thu-1800   feeding supplement (NEPRO CARB STEADY)  237 mL Oral BID BM   heparin  5,000 Units Subcutaneous Q8H   methocarbamol  250 mg Oral BID   midodrine  2.5 mg Oral BID WC   multivitamin  1 tablet  Oral QHS   pantoprazole  40 mg Oral Daily   sucroferric oxyhydroxide  1,000 mg Oral TID WC   traZODone  75 mg Oral QHS     Dialysis Orders: [facility] TTS  3:45 450/A1.5x 2K/2Ca EDW 85.7kg TDC  -Heparin 3000 units IV TIW  -No ESA -Calcitriol 1.5 MCG PO TIW   Assessment/Plan: 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 ID following. Blood Cx 6/16 MSSA, negative Cx on 6/17, 6/19. TDC removed 6/18. Completed 72 hour line holiday. TTE concerning for endocarditis. TEE cancelled d/t high risk factors. LS MRI with possible L5-S1 discitis. Temp cath placed 6/21, but poor function requiring exchange - see below. Will be getting IV Cefazolin 2g q HD x 6 weeks (until 11/04/22). Dialysis Access: [facility]IJ TDC removed 6/18 - s/p line holiday, then temp line placed 6/21 with recurrent non-function issues despite exchange 6/26. S/p [facility] 7/1 with IR.  SBO - on CT Ab 6/19. Now resolved, surgery team has signed  off. Hyperkalemia: K+ 6.0 today. He is on regular diet. Changed to renal diet.Use 2.0 K bath. Follow K+ levels.  ESRD: Usual TTS schedule - then line issues preventing HD 6/27 -7/1. Now resolved and HD 7/1, 7/2 to correct. Tolerated HD Th w/ 3.5L net UF and 3L on 7/6. Appears to be euvolemic today; EDW is likely ~75.2-76kg.   Next HD 10/02/2022 Perm Access: S/p L AVF ligation in 2019. Last seen by Dr. Edilia Bo 01/2022 - limited mobility in R arm so planning for L arm graft but had not scheduled surgery yet.   Hypertension/volume: BP better, Not overtly volume overloaded by exam however sodium is falling. BP soft, not on antihypertensive meds. Start midodrine 10 mg PO three times per week with HD.  UF as tolerated. Will need lower EDW on discharge. Anemia of ESRD: Hgb 8.2- continue Aranesp q Thursday. have increased next dose. Metabolic bone disease: CorrCa ok, Phos initially 7.5.Binder changed to Velphoro - have ^d dose to 2/meals.  Nutrition - Renal diet, Alb low - continue supplements Dispo - CIR    Diamonds Lippard H. Taffany Heiser NP-C 09/30/2022, 2:16 PM  [employer] [phone]

## 2022-10-01 DIAGNOSIS — M4626 Osteomyelitis of vertebra, lumbar region: Secondary | ICD-10-CM | POA: Diagnosis not present

## 2022-10-01 MED ORDER — MIDODRINE HCL 5 MG PO TABS
5.0000 mg | ORAL_TABLET | ORAL | Status: DC
  Administered 2022-10-02 (×2): 5 mg via ORAL
  Filled 2022-10-01 (×2): qty 1

## 2022-10-01 MED ORDER — TRAZODONE HCL 50 MG PO TABS
150.0000 mg | ORAL_TABLET | Freq: Every day | ORAL | Status: DC
  Administered 2022-10-01 – 2022-10-08 (×8): 150 mg via ORAL
  Filled 2022-10-01 (×8): qty 3

## 2022-10-01 MED ORDER — MIDODRINE HCL 5 MG PO TABS: 5.0000 mg | ORAL_TABLET | Freq: Three times a day (TID) | ORAL | Status: DC

## 2022-10-01 MED ORDER — MIDODRINE HCL 5 MG PO TABS: 10.0000 mg | ORAL_TABLET | ORAL | Status: DC

## 2022-10-01 MED ORDER — MIDODRINE HCL 5 MG PO TABS
5.0000 mg | ORAL_TABLET | Freq: Two times a day (BID) | ORAL | Status: DC
  Administered 2022-10-01 (×2): 5 mg via ORAL
  Filled 2022-10-01 (×2): qty 1

## 2022-10-01 MED ORDER — MIDODRINE HCL 5 MG PO TABS
5.0000 mg | ORAL_TABLET | ORAL | Status: DC
  Administered 2022-10-01 – 2022-10-03 (×2): 5 mg via ORAL
  Filled 2022-10-01 (×2): qty 1

## 2022-10-01 NOTE — Progress Notes (Signed)
Occupational Therapy Session Note  Patient Details  Name: Ryan [name] MRN: [id number] Date of Birth: 02-22-1961  Today's Date: 10/01/2022 OT Individual Time: 1445-1530 OT Individual Time Calculation (min): 45 min  and Today's Date: 10/01/2022 OT Missed Time: -15 Minutes Missed Time Reason: Nursing care   Short Term Goals: Week 1:  OT Short Term Goal 1 (Week 1): Pt will complete bathing at CGA in shower OT Short Term Goal 2 (Week 1): Pt will complete AE training to demonstrate LB dressing CGA OT Short Term Goal 3 (Week 1): Pt will complete standing activity with tolerance >5 minutes  Skilled Therapeutic Interventions/Progress Updates:      Therapy Documentation Precautions:  Precautions Precautions: Fall Restrictions Weight Bearing Restrictions: No General: "Hello there!" Pt transferring with nurse from W/C>bed upon OT arrival, agreeable to OT.   Vital Signs: See nursing flowsheet for vitals. Pt experiencing hypotensive episode upon OT arrival. Nurse attending to pt. Nurse notified OT to have pt participate in bed level exercise in order to manage blood pressure. 85/53 BP; 92 bpm, 99% SpO2 at end of session.   Pain: 0/10 pain reported/noted   ADL: Bed mobility: SBA EOB>supine with increased time to manage legs into bed   Exercises: Pt completed the following exercise circuit in order to improve functional activity, strength and endurance to prepare for ADLs such as bathing. Pt completed the following exercises in bed level position with 3x10 repetitions on each exercise: -bicep curls -triceps extensions -forward punches -internal rotation -shoulder flexion -diagonal shoulder raises PRN rest breaks during trials.    Other Treatments:  Pt educated on purpose of ACE wraps and TED hose d/t low BP issues.    Pt supine in bed with bed alarm activated, 2 bed rails up, call light within reach and 4Ps assessed. Pt on heating pad on lower back for spasm  management.   Therapy/Group: Individual Therapy  Velia Meyer, OTD, OTR/L 10/01/2022, 3:48 PM

## 2022-10-01 NOTE — Patient Care Conference (Signed)
Inpatient RehabilitationTeam Conference and Plan of Care Update Date: 09/30/2022   Time: 11:51 AM    Patient Name: Ryan [name]      Medical Record Number: [id number]  Date of Birth: 02-07-1961 Sex: Male         Room/Bed: 4U98J/1B14N-82 Payor Info: Payor: [employer] / Plan: [employer] - [place] / Product Type: *No Product type* /    Admit Date/Time:  09/25/2022  4:32 PM  Primary Diagnosis:  Acute osteomyelitis of lumbar spine [facility])  Hospital Problems: Principal Problem:   Acute osteomyelitis of lumbar spine [facility]) Active Problems:   Diskitis    Expected Discharge Date: Expected Discharge Date: 10/10/22  Team Members Present: Physician leading conference: Dr. Genice Rouge Social Worker Present: Dossie Der, LCSW Nurse Present: Vedia Pereyra, RN PT Present: Truitt Leep, PT OT Present: Velia Meyer, OT PPS Coordinator present : Fae Pippin, SLP     Current Status/Progress Goal Weekly Team Focus  Bowel/Bladder   pt is continent of b/b. makes little urine   Remain continent   Assist with needs/transfer qshift and prn    Swallow/Nutrition/ Hydration               ADL's   SBA UB ADLs, Min A LB ADLs with AE, CGA for bathing when standing, CGA for functional transfers with RW   Mod I   AE retraining for efficiency, endurance training, BARRIERS: lives alone, however family is able to help    Mobility   CGA transfers, CGA gait 250' RW, min steps x 2 with 2 HR's ( 3 inch)   Mod I  curb step & activity tolerance    Communication                Safety/Cognition/ Behavioral Observations  Supervision-Min A   Mod I   recall with use of compenatory strategies, initiation, complex problem solving with functional tasks    Pain   no c/o pain   Remain pain free   Assess qshift and prn    Skin   Skin intact   Maintain skin integrity  Assess qshift and prn      Discharge Planning:  HOme alone with family coming in and out-daughter doesn;t work  but has five children under the age of 63, son lives around the corner and works weekends. Family to transport to HD treatments. Await team's recommendations for DME and follow up   Team Discussion: Acute osteomyelitis of lumbar spine. HD T/Th/Sat IV Anef given on dialysis. Atenolol held for a couple days and not stopped. Midodrine increased due to soft B/P.  Encouraging thigh high TEDs and ace wraps when getting out of bed. Working on use of AE to promote independence. Barriers are fatigue and home alone with family check ins.  Patient on target to meet rehab goals: yes, continues to progress towards goals with discharge date of 10/10/22  *See Care Plan and progress notes for long and short-term goals.   Revisions to Treatment Plan:  15/7 therapy.  Medication adjustments. Monitor labs and VS Teaching Needs: Medications, safety, self care, gait/transfer training, etc.   Current Barriers to Discharge: IV antibiotics, Lack of/limited family support, and Hemodialysis  Possible Resolutions to Barriers: Family education Order recommended DME     Medical Summary Current Status: Na 128 and L+ 5.3- per renal- out of it/lethargic with Pain meds- wears corset all the time- more comofrtable when wearing per pt- skin OK; orthostatic hypotension- asymptomatic- but went to 70/60s systolic-  Barriers  to Discharge: Uncontrolled Pain;Hypotension;Self-care education;Medical stability;Other (comments)  Barriers to Discharge Comments: anuric- limited by pain- and confusion from pain meds- goals mod I Possible Resolutions to [employer] Focus: need to speak with renal about   Continued Need for Acute Rehabilitation Level of Care: The patient requires daily medical management by a physician with specialized training in physical medicine and rehabilitation for the following reasons: Direction of a multidisciplinary physical rehabilitation program to maximize functional independence : Yes Medical management  of patient stability for increased activity during participation in an intensive rehabilitation regime.: Yes Analysis of laboratory values and/or radiology reports with any subsequent need for medication adjustment and/or medical intervention. : Yes   I attest that I was present, lead the team conference, and concur with the assessment and plan of the team.   Jearld Adjutant 10/01/2022, 8:04 AM

## 2022-10-01 NOTE — Plan of Care (Signed)
  Problem: Consults Goal: RH GENERAL PATIENT EDUCATION Description: See Patient Education module for education specifics. Outcome: Progressing   Problem: RH BOWEL ELIMINATION Goal: RH STG MANAGE BOWEL WITH ASSISTANCE Description: STG Manage Bowel with min Assistance. Outcome: Progressing   Problem: RH BLADDER ELIMINATION Goal: RH STG MANAGE BLADDER WITH ASSISTANCE Description: STG Manage Bladder With min Assistance Outcome: Progressing   Problem: Education: Goal: Knowledge of General Education information will improve Description: Including pain rating scale, medication(s)/side effects and non-pharmacologic comfort measures Outcome: Progressing   Problem: Health Behavior/Discharge Planning: Goal: Ability to manage health-related needs will improve Outcome: Progressing   Problem: Clinical Measurements: Goal: Ability to maintain clinical measurements within normal limits will improve Outcome: Progressing Goal: Will remain free from infection Outcome: Progressing Goal: Diagnostic test results will improve Outcome: Progressing Goal: Respiratory complications will improve Outcome: Progressing Goal: Cardiovascular complication will be avoided Outcome: Progressing   Problem: Activity: Goal: Risk for activity intolerance will decrease Outcome: Progressing   Problem: Nutrition: Goal: Adequate nutrition will be maintained Outcome: Progressing   Problem: Coping: Goal: Level of anxiety will decrease Outcome: Progressing   Problem: Elimination: Goal: Will not experience complications related to bowel motility Outcome: Progressing Goal: Will not experience complications related to urinary retention Outcome: Progressing   Problem: Pain Managment: Goal: General experience of comfort will improve Outcome: Progressing   Problem: Safety: Goal: Ability to remain free from injury will improve Outcome: Progressing   Problem: Skin Integrity: Goal: Risk for impaired skin  integrity will decrease Outcome: Progressing

## 2022-10-01 NOTE — Progress Notes (Signed)
Physical Therapy Session Note  Patient Details  Name: Ryan [name] MRN: [id number] Date of Birth: December 12, 1960  Today's Date: 10/01/2022 PT Individual Time: 0900-0945 PT Individual Time Calculation (min): 45 min   Short Term Goals: Week 1:  PT Short Term Goal 1 (Week 1): Pt will complete sit<>stand transfer with CGA and LRAD PT Short Term Goal 2 (Week 1): Pt will ambulate 200' with LRAD CGA PT Short Term Goal 3 (Week 1): Pt will complete bed mobility CGA without hospital bed features PT Short Term Goal 4 (Week 1): Pt will complete up/down 1 curb step with RW CGA  Skilled Therapeutic Interventions/Progress Updates:      Therapy Documentation Precautions:  Precautions Precautions: Fall Restrictions Weight Bearing Restrictions: No   Pt received semi-reclined in bed and with unrated back pain, provided rest/repositioning for relief. Pt dependent for donning ted hose and ace wraps. CGA with bed mobility with use of bed features and gait ~250 ft with RW with cues for pacing/energy conservation. Pt returned to room by w/c (dependent) and instructed in hamstring and lumbar flexibility exercises at bed and chair level. Pt left seated in recliner at bedside with all needs in reach and alarm on.    Therapy/Group: Individual Therapy  Truitt Leep Truitt Leep PT, DPT  10/01/2022, 5:37 AM

## 2022-10-01 NOTE — Discharge Instructions (Signed)
Inpatient Rehab Discharge Instructions  Ryan [name] Discharge date and time:    Activities/Precautions/ Functional Status: Activity: no lifting, driving, or strenuous exercise for till cleared by MD Diet: renal diet 1200 cc FR Wound Care: none needed   Functional status:  ___ No restrictions     ___ Walk up steps independently ___ 24/7 supervision/assistance   ___ Walk up steps with assistance ___ Intermittent supervision/assistance  ___ Bathe/dress independently ___ Walk with walker     ___ Bathe/dress with assistance ___ Walk Independently    ___ Shower independently ___ Walk with assistance    ___ Shower with assistance ___ No alcohol     ___ Return to work/school ________  Special Instructions:  STROKE/TIA DISCHARGE INSTRUCTIONS SMOKING Cigarette smoking nearly doubles your risk of having a stroke & is the single most alterable risk factor  If you smoke or have smoked in the last 12 months, you are advised to quit smoking for your health. Most of the excess cardiovascular risk related to smoking disappears within a year of stopping. Ask you doctor about anti-smoking medications [place] Quit Line: 1-800-QUIT NOW Free Smoking Cessation Classes [phone]  CHOLESTEROL Know your levels; limit fat & cholesterol in your diet  Lipid Panel     Component Value Date/Time   CHOL 144 09/20/2022 0250   TRIG 147 09/20/2022 0250   HDL 16 (L) 09/20/2022 0250   CHOLHDL 9.0 09/20/2022 0250   VLDL 29 09/20/2022 0250   LDLCALC 99 09/20/2022 0250     Many patients benefit from treatment even if their cholesterol is at goal. Goal: Total Cholesterol (CHOL) less than 160 Goal:  Triglycerides (TRIG) less than 150 Goal:  HDL greater than 40 Goal:  LDL (LDLCALC) less than 100   BLOOD PRESSURE American Stroke Association blood pressure target is less that 120/80 mm/Hg  Your discharge blood pressure is:  BP: (!) 89/58 Monitor your blood pressure Limit your salt and alcohol intake Many  individuals will require more than one medication for high blood pressure  DIABETES (A1c is a blood sugar average for last 3 months) Goal HGBA1c is under 7% (HBGA1c is blood sugar average for last 3 months)  Diabetes: No known diagnosis of diabetes    Lab Results  Component Value Date   HGBA1C 6.3 (H) 09/19/2022    Your HGBA1c can be lowered with medications, healthy diet, and exercise. Check your blood sugar as directed by your physician Call your physician if you experience unexplained or low blood sugars.  PHYSICAL ACTIVITY/REHABILITATION Goal is 30 minutes at least 4 days per week  Activity: No driving, Therapies: see above Return to work: N/A Activity decreases your risk of heart attack and stroke and makes your heart stronger.  It helps control your weight and blood pressure; helps you relax and can improve your mood. Participate in a regular exercise program. Talk with your doctor about the best form of exercise for you (dancing, walking, swimming, cycling).  DIET/WEIGHT Goal is to maintain a healthy weight  Your discharge diet is:  Diet Order             Diet renal with fluid restriction Fluid restriction: 1200 mL Fluid; Room service appropriate? Yes; Fluid consistency: Thin  Diet effective now                   liquids Your height is:  Height: 5\' 11"  (180.3 cm) Your current weight is: Weight: 76.4 kg Your Body Mass Index (BMI) is:  BMI (  Calculated): 23.5 Following the type of diet specifically designed for you will help prevent another stroke. You are at goal weight   Your goal Body Mass Index (BMI) is 19-24. Healthy food habits can help reduce 3 risk factors for stroke:  High cholesterol, hypertension, and excess weight.  RESOURCES Stroke/Support Group:  Call [phone]   STROKE EDUCATION PROVIDED/REVIEWED AND GIVEN TO PATIENT Stroke warning signs and symptoms How to activate emergency medical system (call 911). Medications prescribed at discharge. Need for  follow-up after discharge. Personal risk factors for stroke. Pneumonia vaccine given:  Flu vaccine given:  My questions have been answered, the writing is legible, and I understand these instructions.  I will adhere to these goals & educational materials that have been provided to me after my discharge from the hospital.     My questions have been answered and I understand these instructions. I will adhere to these goals and the provided educational materials after my discharge from the hospital.  Patient/Caregiver Signature _______________________________ Date __________  Clinician Signature _______________________________________ Date __________  Please bring this form and your medication list with you to all your follow-up doctor's appointments.

## 2022-10-01 NOTE — Progress Notes (Signed)
Speech Language Pathology Daily Session Note  Patient Details  Name: Ryan [name] MRN: [id number] Date of Birth: 1960/10/27  Today's Date: 10/01/2022 SLP Individual Time: 1300-1345 SLP Individual Time Calculation (min): 45 min  Short Term Goals: Week 1: SLP Short Term Goal 1 (Week 1): Patient will demonstrate complex problem solving for functional and familiar tasks with supervision level verbal cues. SLP Short Term Goal 2 (Week 1): Patient will recall new, daily information with supervision level verbal cues for use of compensatory strategies.  Skilled Therapeutic Interventions:   Pt greeted in his wheelchair at bedside after noon meal. He was pleasant and agreeable to tasks targeting cognition. SLP facilitated tx tasks targeting cognition. Pt completed moderately complex time management task, benefiting from [facility] verbal/visual cues for information processing, organization, and calculations. Pt also completed 3 step memory task targeting working memory. He initially only required spv cues for recall, though increased cueing (minA) required as task continued. Suspect reduced cognitive endurance negatively impacted success as task continued. Slightly reduced insight into cognitive deficits noted today as compared to eval. Pt reported that his performance today was "normal," though mild cognitive deficits during complex tasks continues. SLP to follow up with family to determine cognitive baseline. Pt left in his wheelchair with chair alarm set. Nursing notified re pain upon SLP departure. Recommend cont ST per POC at this time.    Pain  Pt reported 7/10 back pain @ the end of tx session 2* extended period of time in wheelchair. Nursing notified. See EMR for details re medication administration.   Therapy/Group: Individual Therapy  Pati Gallo 10/01/2022, 1:22 PM

## 2022-10-01 NOTE — Progress Notes (Signed)
PROGRESS NOTE   Subjective/Complaints:  Pt reports doesn't feel dizzy, but BP 92/57 this AM.  Didn't sleep much- got IV ABX when got back from HD- not in HD. Also HD taking so long- and just wants it "done".   Also has anxiety at night and difficulty sleeping as a result.  Was told had yellow MEWS this AM  ROS:   Pt denies SOB, abd pain, CP, N/V/C/D, and vision changes    Except for HPI  Objective:   No results found. Recent Labs    09/29/22 0656 09/30/22 1142  WBC 13.1* 12.9*  HGB 7.8* 8.2*  HCT 25.0* 26.1*  PLT 267 283   Recent Labs    09/29/22 0656 09/30/22 1142  NA 127* 126*  K 5.3* 6.0*  CL 90* 90*  CO2 23 20*  GLUCOSE 97 105*  BUN 68* 94*  CREATININE 7.87* 10.14*  CALCIUM 8.3* 8.5*    Intake/Output Summary (Last 24 hours) at 10/01/2022 0813 Last data filed at 09/30/2022 1740 Gross per 24 hour  Intake 177 ml  Output 1900 ml  Net -1723 ml        Physical Exam: Vital Signs Blood pressure 92/67, pulse 98, temperature 98.7 F (37.1 C), resp. rate 18, height 5\' 11"  (1.803 m), weight 76.4 kg, SpO2 98 %.      General: awake, alert, appropriate,  sitting up in bed; eating breakfast; NAD HENT: conjugate gaze; oropharynx moist CV: regular  to borderline tachycardic rate-regular rhythm; no JVD Pulmonary: CTA B/L; no W/R/R- good air movement GI: soft, NT, ND, (+)BS- normoactive Psychiatric: appropriate Neurological: Ox3  PRIOR EXAMS: Skin: + L upper chest HD catheter - dressing c/d/i + Nonpainful erythematous lesions on bilateral feet  MSK:      No apparent deformity.      Strength: Exam limited d/t being on HD; all 4 limbs equal, antigravity and against resistance   Neurologic exam:  Cognition: AAO to person, place, time and event.  + Mild lethargy - intermittently falling asleep during late dialysis; more awake in early exam Language: Fluent, No substitutions or neoglisms. No  dysarthria. Names 3/3 objects correctly.  Memory: Recalls 3/3 objects at 5 minutes. No apparent deficits  Insight: Good  insight into current condition.  Mood: Pleasant affect, appropriate mood.  Sensation: To light touch intact in BL UEs and LEs  Reflexes: 1+ in BL UE and LEs. Negative Hoffman's and babinski signs bilaterally.  CN: 2-12 grossly intact.  Coordination: No apparent tremors. No ataxia on FTN, HTS bilaterally.  Spasticity: MAS 0 in all extremities.       Assessment/Plan: 1. Functional deficits which require 3+ hours per day of interdisciplinary therapy in a comprehensive inpatient rehab setting. Physiatrist is providing close team supervision and 24 hour management of active medical problems listed below. Physiatrist and rehab team continue to assess barriers to discharge/monitor patient progress toward functional and medical goals  Care Tool:  Bathing    Body parts bathed by patient: Right arm, Left arm, Chest, Abdomen, Front perineal area, Buttocks, Right upper leg, Left upper leg, Right lower leg, Left lower leg, Face         Bathing assist Assist  Level: Contact Guard/Touching assist     Upper Body Dressing/Undressing Upper body dressing   What is the patient wearing?: Pull over shirt    Upper body assist Assist Level: Supervision/Verbal cueing    Lower Body Dressing/Undressing Lower body dressing            Lower body assist Assist for lower body dressing: Contact Guard/Touching assist     Toileting Toileting    Toileting assist Assist for toileting: Contact Guard/Touching assist     Transfers Chair/bed transfer  Transfers assist     Chair/bed transfer assist level: Contact Guard/Touching assist     Locomotion Ambulation   Ambulation assist      Assist level: Minimal Assistance - Patient > 75% Assistive device: Walker-rolling Max distance: 170'   Walk 10 feet activity   Assist     Assist level: Minimal Assistance - Patient  > 75% Assistive device: Walker-rolling   Walk 50 feet activity   Assist    Assist level: Minimal Assistance - Patient > 75% Assistive device: Walker-rolling    Walk 150 feet activity   Assist    Assist level: Minimal Assistance - Patient > 75% Assistive device: Walker-rolling    Walk 10 feet on uneven surface  activity   Assist Walk 10 feet on uneven surfaces activity did not occur: Safety/medical concerns         Wheelchair     Assist Is the patient using a wheelchair?: Yes Type of Wheelchair: Manual    Wheelchair assist level: Dependent - Patient 0% (pain limits)      Wheelchair 50 feet with 2 turns activity    Assist        Assist Level: Dependent - Patient 0%   Wheelchair 150 feet activity     Assist      Assist Level: Dependent - Patient 0%   Blood pressure 92/67, pulse 98, temperature 98.7 F (37.1 C), resp. rate 18, height 5\' 11"  (1.803 m), weight 76.4 kg, SpO2 98 %.  Medical Problem List and Plan: 1. Functional deficits secondary to lumbar osteomyelitis/diskitis d/t MSSA bacteremia.heart vegetations/Endocarditis              -patient may shower             -ELOS/Goals: PT/OT/SLP supervision to mod I, 10-14 days  -d/c 7/19  Con't CIR PT and OT- still limited by lack of sleep; weakness and low BP 2.  Antithrombotics: -DVT/anticoagulation:  Pharmaceutical: Heparin 5000 q8h             -antiplatelet therapy: N/A 3. Low back pain/Pain Management: tylenol, Oxycodone prn. Advised to use oxycodone prior to sleep --will schedule low dose robaxin 250mg  bid as reports ongoing lower back and LE spasms.  -7/5- will increase Oxy to 10-15 mg q4 hours prn-can add long acting over weekend if necessary -7/6-7/24 pain seems controlled for now, monitor 7/8-7/9 pt reports pain doing much better- con't regimen 4. Mood/Behavior/Sleep: LCSW to follow for evaluation and support.              -antipsychotic agents: N/A --acute on chronic insomnia in  part due to anxiety. Will schedule low dose trazodone. Continue prn melatonin -7/5- will change trazodone to 75 mg at bedtime- and can be titrated more over weekend if needed.  7/9- sleeping well  7/10- didn't sleep well- will increase trazodone to 150 mg QHS 5. Neuropsych/cognition: This patient is capable of making decisions on his own behalf. 6. Skin/Wound Care: Routine pressure relief  measures -continue to monitor new lesions on bilateral feet for evolution/progress   7. Fluids/Electrolytes/Nutrition: Strict I/O. Daily weights. Labs TTS with HD. Add low salt restrictions             --continue Nephro supplements 8. 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  --IV cefazolin with HD. EOT 11/04/22              --Fevers have resolved. CRP-44/Sed rate 1.5 @ admission.  --Leucocytosis trending down from 23.9-->31.4-->13.9.    -7/5- Pt's WBC down to 13/9- will recheck labs in AM as well as qmonday -09/27/22 WBC 13.2, cont to monitor  7/8- WBC stable at 13k- CRP 17.3 and ESR >140- will call ID- they said ESR not real helpful in ESRD pt's- so to make sure CRP improving and pt looks good, to call them back before pt leaves.  Called, since ESR rising- last checked 3 weeks ago and WBC won't resolve. 7/9- ID ok with labs since ESR not a good indicator in ESRD- follow labs weekly.   9. SBO w/ enteritis: Has resolved. Surgery team signed off 10. ESRD: HD TTS at the end of the day to help with tolerance of therapy.              --strict I/O with daily weights. Continues to require 4 L oxygen per [place].  --On Liberalized diet but 1200 cc FR/4 gram salt.  Velphoro for elevated phos level. -7/5- Na down to 128- per Renal  -09/27/22 dialysis today 7/9- K+_ 5.3- this AM and Na 127- per renal to intervene- they will likely check labs and follow up 7/10- K+ up, but then held HD yesterday. Na  126-  11. Anemia of chronic disease: On Aranesp weekly every Thursday w/HD.  12. HTN: Monitor BP TID. On low dose tenormin 12.5mg  every day but BP with drops during HD.  -7/5- BP running low 90s systolic when laying down- I'm concerned will have orthostatic hypotension-will check with PT/OT  -09/27/22 BPs soft, monitor closely -09/28/22 BP a little softer today, hold atenolol this morning, monitor to see if changes need to be made to that. Denies orthostatic symptoms.  7/8- held atenolol- BP dropped to 60-70's systolic yesterday- was 90s SBP this AM- will monitor- midodrine increased yesterday to 2.5 mg 7/9- will hold atenolol since BP 104 systolic and going to HD today.   7/10- stopped Atenolol and increased Midodrine to 5 mg BID with breakfast/lunch Vitals:   09/30/22 1530 09/30/22 1535 09/30/22 1545 09/30/22 1600  BP: (!) 87/58 103/70 126/68 123/80   09/30/22 1630 09/30/22 1645 09/30/22 1700 09/30/22 1715  BP: 109/74 103/73 111/76 112/66   09/30/22 1740 09/30/22 1900 10/01/22 0407 10/01/22 0625  BP: 92/60 (!) 100/52 (!) 139/105 92/67    13. AHRF d/t cavitary pneumonia. Wean oxygen as able. Currently 2-4L; per patient not using CPAP/BiPAP   -7/5- will wean O2 as allowed  -09/27/22 no O2 on today, monitor 14. Constipation:  -09/28/22 had a small BM yesterday, lots of gas, will do dose of miralax now then add on QD PRN dose, advised to take senna with the miralax dose now, then could use the PRN miralax dose after lunch if still not successful-- if not working, then use suppository +/- enema.  7/8- LBM  7/6- small- feels like needs to go- wants to wait on more intervention.   7/9- Had good sized BM yesterday-  feeling better  7/10- bowels "going ok".    I spent a total of  54   minutes on total care today- >50% coordination of care- due to  D/w sister about care- as well as nursing x2 about low BP even off Atenolol-  And adding Midodrine.   LOS: 6 days A FACE TO FACE EVALUATION WAS  PERFORMED  Amalea Ottey 10/01/2022, 8:13 AM

## 2022-10-02 ENCOUNTER — Inpatient Hospital Stay (HOSPITAL_COMMUNITY): Payer: No Typology Code available for payment source

## 2022-10-02 DIAGNOSIS — N186 End stage renal disease: Secondary | ICD-10-CM | POA: Diagnosis not present

## 2022-10-02 DIAGNOSIS — M4626 Osteomyelitis of vertebra, lumbar region: Secondary | ICD-10-CM | POA: Diagnosis not present

## 2022-10-02 DIAGNOSIS — Z992 Dependence on renal dialysis: Secondary | ICD-10-CM | POA: Diagnosis not present

## 2022-10-02 DIAGNOSIS — R059 Cough, unspecified: Secondary | ICD-10-CM

## 2022-10-02 DIAGNOSIS — E875 Hyperkalemia: Secondary | ICD-10-CM | POA: Diagnosis not present

## 2022-10-02 DIAGNOSIS — R0989 Other specified symptoms and signs involving the circulatory and respiratory systems: Secondary | ICD-10-CM | POA: Diagnosis not present

## 2022-10-02 DIAGNOSIS — D72829 Elevated white blood cell count, unspecified: Secondary | ICD-10-CM | POA: Diagnosis present

## 2022-10-02 DIAGNOSIS — I517 Cardiomegaly: Secondary | ICD-10-CM | POA: Diagnosis not present

## 2022-10-02 LAB — CBC WITH DIFFERENTIAL/PLATELET
Abs Immature Granulocytes: 0.14 10*3/uL — ABNORMAL HIGH (ref 0.00–0.07)
Basophils Absolute: 0 10*3/uL (ref 0.0–0.1)
Basophils Relative: 0 %
Eosinophils Absolute: 0.2 10*3/uL (ref 0.0–0.5)
Eosinophils Relative: 2 %
HCT: 26.8 % — ABNORMAL LOW (ref 39.0–52.0)
Hemoglobin: 8.5 g/dL — ABNORMAL LOW (ref 13.0–17.0)
Immature Granulocytes: 1 %
Lymphocytes Relative: 6 %
Lymphs Abs: 0.8 10*3/uL (ref 0.7–4.0)
MCH: 26.6 pg (ref 26.0–34.0)
MCHC: 31.7 g/dL (ref 30.0–36.0)
MCV: 84 fL (ref 80.0–100.0)
Monocytes Absolute: 1.2 10*3/uL — ABNORMAL HIGH (ref 0.1–1.0)
Monocytes Relative: 9 %
Neutro Abs: 11 10*3/uL — ABNORMAL HIGH (ref 1.7–7.7)
Neutrophils Relative %: 82 %
Platelets: 352 10*3/uL (ref 150–400)
RBC: 3.19 MIL/uL — ABNORMAL LOW (ref 4.22–5.81)
RDW: 15.8 % — ABNORMAL HIGH (ref 11.5–15.5)
WBC: 13.3 10*3/uL — ABNORMAL HIGH (ref 4.0–10.5)
nRBC: 0 % (ref 0.0–0.2)

## 2022-10-02 LAB — RENAL FUNCTION PANEL
Albumin: 2.1 g/dL — ABNORMAL LOW (ref 3.5–5.0)
Anion gap: 19 — ABNORMAL HIGH (ref 5–15)
BUN: 61 mg/dL — ABNORMAL HIGH (ref 8–23)
CO2: 24 mmol/L (ref 22–32)
Calcium: 8.9 mg/dL (ref 8.9–10.3)
Chloride: 85 mmol/L — ABNORMAL LOW (ref 98–111)
Creatinine, Ser: 9.09 mg/dL — ABNORMAL HIGH (ref 0.61–1.24)
GFR, Estimated: 6 mL/min — ABNORMAL LOW (ref >60)
Glucose, Bld: 109 mg/dL — ABNORMAL HIGH (ref 70–99)
Phosphorus: 4.5 mg/dL (ref 2.5–4.6)
Potassium: 5.3 mmol/L — ABNORMAL HIGH (ref 3.5–5.1)
Sodium: 128 mmol/L — ABNORMAL LOW (ref 135–145)

## 2022-10-02 LAB — BASIC METABOLIC PANEL
Anion gap: 13 (ref 5–15)
BUN: 61 mg/dL — ABNORMAL HIGH (ref 8–23)
CO2: 23 mmol/L (ref 22–32)
Calcium: 8.4 mg/dL — ABNORMAL LOW (ref 8.9–10.3)
Chloride: 90 mmol/L — ABNORMAL LOW (ref 98–111)
Creatinine, Ser: 8.75 mg/dL — ABNORMAL HIGH (ref 0.61–1.24)
GFR, Estimated: 6 mL/min — ABNORMAL LOW (ref >60)
Glucose, Bld: 90 mg/dL (ref 70–99)
Potassium: 5.2 mmol/L — ABNORMAL HIGH (ref 3.5–5.1)
Sodium: 126 mmol/L — ABNORMAL LOW (ref 135–145)

## 2022-10-02 LAB — BRAIN NATRIURETIC PEPTIDE: B Natriuretic Peptide: 355 pg/mL — ABNORMAL HIGH (ref 0.0–100.0)

## 2022-10-02 LAB — PROCALCITONIN: Procalcitonin: 5.26 ng/mL

## 2022-10-02 MED ORDER — GUAIFENESIN ER 600 MG PO TB12
600.0000 mg | ORAL_TABLET | Freq: Two times a day (BID) | ORAL | Status: DC
  Administered 2022-10-02 – 2022-10-09 (×14): 600 mg via ORAL
  Filled 2022-10-02 (×14): qty 1

## 2022-10-02 MED ORDER — HEPARIN SODIUM (PORCINE) 1000 UNIT/ML IJ SOLN
INTRAMUSCULAR | Status: AC
  Filled 2022-10-02: qty 4

## 2022-10-02 MED ORDER — SODIUM CHLORIDE 0.9 % IV SOLN: INTRAVENOUS | Status: DC

## 2022-10-02 MED ORDER — HEPARIN SODIUM (PORCINE) 1000 UNIT/ML IJ SOLN
INTRAMUSCULAR | Status: AC
  Filled 2022-10-02: qty 3

## 2022-10-02 NOTE — Progress Notes (Signed)
Occupational Therapy Session Note  Patient Details  Name: NAZIR HACKER MRN: [id number] Date of Birth: 1960/10/14  Today's Date: 10/02/2022 OT Individual Time: 0930-1000 OT Individual Time Calculation (min): 30 min    Short Term Goals: Week 1:  OT Short Term Goal 1 (Week 1): Pt will complete bathing at CGA in shower OT Short Term Goal 2 (Week 1): Pt will complete AE training to demonstrate LB dressing CGA OT Short Term Goal 3 (Week 1): Pt will complete standing activity with tolerance >5 minutes  Skilled Therapeutic Interventions/Progress Updates:      Therapy Documentation Precautions:  Precautions Precautions: Fall Restrictions Weight Bearing Restrictions: No General: "Hi Jentry Mcqueary!" Pt seated in W/C upon OT arrival, agreeable to OT.  Vital Signs: BP: 96/55 at start of session seated in W/C  Pain: 0/10 pain reported/noted  ADL: Grooming: SBA seated at sink for washing face, increased time for cognitive processing Oral hygiene: SBA seated at sink, able to reach forward for items located on sink, increased time for cognitive processing     Other Treatments:  Pt introduced to rollator for increased safety for functional mobility after discussion with PT. Pt educated/demonstrated safe handling strategies, how to lock breaks 2/2 ways and how to unlock breaks in order to walk. Pt demonstrated good carryover on safety techniques. Pt ambulated household distances with rollator SBA with no LOB/SOB with good safety techniques.   Pt seated in W/C at end of session with W/C alarm donned, call light within reach and 4Ps assessed.    Therapy/Group: Individual Therapy  Velia Meyer, OTD, OTR/L 10/02/2022, 4:05 PM

## 2022-10-02 NOTE — Progress Notes (Addendum)
Speech Language Pathology Weekly Progress and Session Note  Patient Details  Name: Ryan [name] MRN: [id number] Date of Birth: [birth year]  Beginning of progress report period: September 26, 2022 End of progress report period: October 02, 2022  Today's Date: 10/02/2022 SLP Individual Time: 1015-1100 SLP Individual Time Calculation (min): 45 min  Short Term Goals: Week 1: SLP Short Term Goal 1 (Week 1): Patient will demonstrate complex problem solving for functional and familiar tasks with supervision level verbal cues. SLP Short Term Goal 1 - Progress (Week 1): Met SLP Short Term Goal 2 (Week 1): Patient will recall new, daily information with supervision level verbal cues for use of compensatory strategies. SLP Short Term Goal 2 - Progress (Week 1): Not met    New Short Term Goals: Week 2: SLP Short Term Goal 1 (Week 2): STGs = LTGs 2* ELOS  Weekly Progress Updates:  Pt demonstrates fair progress towards ST goals, as he met 1 out of 2 STGs this week. He demonstrates slight improvements with complex problem solving, however, would benefit from continued services to further address complex cognitive skills (sustained attention, processing, problem solving, cognitive endurance) and memory to maximize pt independence and facilitate return to prev roles/ responsibilities upon d/c.    Intensity: Minumum of 1-2 x/day, 30 to 90 minutes Frequency: 1 to 3 out of 7 days Duration/Length of Stay: 7/19 Treatment/Interventions: Cognitive remediation/compensation;Cueing hierarchy;Environmental controls;Functional tasks;Internal/external aids;Patient/family education;Speech/Language facilitation;Therapeutic Activities   Daily Session  Skilled Therapeutic Interventions: Pt greeted in his room, asleep in his wheelchair. He woke easily upon SLP arrival. Pt assisted pt w/ heating up and set up of morning meal, as he was unable to eat it yet. Adequate verbal problem solving noted in conversation to assist  SLP. He benefited from spv cues for working memory during Dispensing optician task and minA verbal cues to recall 4 words after 10 min delay x2. Pt also presented w/ reduced sustained attention today, as he was fighting to stay awake throughout the tx session. Of note, pt set to receive dialysis today and reported that dialysis days typically result in increased fatigue. Improved error awareness and insight noted today compared to yesterday, as pt stated "you're a real challenge for me" at the end of tx tasks. Pt left in his wheelchair with his chair alarm set. Recommend cont ST per POC     Pain Pain Assessment Pain Scale: 0-10 Pain Score: 7  Pain Type: Chronic pain Pain Location: Back Pain Orientation: Lower Pain Descriptors / Indicators: Constant;Discomfort Pain Frequency: Intermittent Pain Onset: On-going Patients Stated Pain Goal: 2 Pain Intervention(s): Medication (See eMAR)  Therapy/Group: Individual Therapy  Pati Gallo, M.S. CCC-SLP 10/02/2022, 10:46 AM

## 2022-10-02 NOTE — Progress Notes (Signed)
   10/02/22 1745  Vitals  Temp 98.7 F (37.1 C)  Pulse Rate (!) 110  Resp 18  BP 123/68  SpO2 97 %  Post Treatment  Dialyzer Clearance Clear  Duration of HD Treatment -hour(s) 3.75 hour(s)  Hemodialysis Intake (mL) 1000 mL  Liters Processed 67.5  Fluid Removed (mL) 100 mL  Tolerated HD Treatment Yes   Received patient in bed to unit.  Alert and oriented.  Informed consent signed and in chart.   TX duration:3.75hrs  Patient tolerated well.  Transported back to the room  Alert, without acute distress.  Hand-off given to patient's nurse.   Access used: [facility] Access issues: none  Total UF removed: +188ml Medication(s) given: none   [facility]

## 2022-10-02 NOTE — Progress Notes (Addendum)
Physical Therapy Session Note  Patient Details  Name: Ryan [name] MRN: [id number] Date of Birth: Jan 11, 1961  Today's Date: 10/02/2022 PT Individual Time: 7829-5621, 3086-5784  PT Individual Time Calculation (min): 34 min , 23 min   Short Term Goals: Week 1:  PT Short Term Goal 1 (Week 1): Pt will complete sit<>stand transfer with CGA and LRAD PT Short Term Goal 2 (Week 1): Pt will ambulate 200' with LRAD CGA PT Short Term Goal 3 (Week 1): Pt will complete bed mobility CGA without hospital bed features PT Short Term Goal 4 (Week 1): Pt will complete up/down 1 curb step with RW CGA  Skilled Therapeutic Interventions/Progress Updates:      Therapy Documentation Precautions:  Precautions Precautions: Fall Restrictions Weight Bearing Restrictions: No  Treatment Session 1:   Pt received semi-reclined in bed, agreeable to PT and reports adequate sleep prior night. Pt with unrated back pain, notified nurse who administered AM medications. MD also present for morning rounds and ordered stat X -ray. PT dependently donned ted hose and ace wraps and X-ray arrived and pt missed 3 minutes of skilled PT.    Treatment Session 2:   Pt agreeable to PT following X-ray. Pt (S) with bed mobility, sit to stand with RW and gait >350 ft with RW with close w/c follow. Pt provided verbal cues to decrease UE grip and pt demonstrates adequate step length and heel to toe. Pt left seated in w/c at bedside with all needs in reach and alarm on. Pt with unrated muscle spasms from back, provided repositioning for relief. Plan to trial rollator in following sessions.   BP:   Sitting: 97/72 (72)   Standing: 119/69 (85)   Post ambulation ~350 ft: 129/67 (85)    Therapy/Group: Individual Therapy  Truitt Leep Truitt Leep PT, DPT  10/02/2022, 5:37 AM

## 2022-10-02 NOTE — Progress Notes (Signed)
Patient refuse CHG bath after several attempts made by staff

## 2022-10-02 NOTE — Progress Notes (Signed)
[facility] Progress Note   Subjective: Seen prior to HD. BP 95/57. He is asymptomatic. Giving NS IVF bolus. No C/Os.     Objective Vitals:   10/01/22 1400 10/01/22 1619 10/01/22 2041 10/02/22 0637  BP: (!) 89/58 92/61 100/61 95/62  Pulse: 90 97 94 88  Resp: 17  16 16   Temp: 98.2 F (36.8 C)  99.6 F (37.6 C) 98.5 F (36.9 C)  TempSrc: Oral  Oral Oral  SpO2: 100% 100% 94% 92%  Weight:    76.6 kg  Height:       Physical Exam General: Chronically ill appearing older male in NAD Heart: S1,S2 No M/R/G Lungs: CTAB A/P Abdomen: NABS, NT Extremities: No LE edema Dialysis Access: [facility] drsg intact  Additional Objective Labs: Basic Metabolic Panel: Recent Labs  Lab 09/27/22 0548 09/29/22 0656 09/30/22 1142 10/02/22 0735  NA 128* 127* 126* 126*  K 5.0 5.3* 6.0* 5.2*  CL 88* 90* 90* 90*  CO2 25 23 20* 23  GLUCOSE 115* 97 105* 90  BUN 75* 68* 94* 61*  CREATININE 8.46* 7.87* 10.14* 8.75*  CALCIUM 8.2* 8.3* 8.5* 8.4*  PHOS 5.1* 4.4  --   --    Liver Function Tests: Recent Labs  Lab 09/27/22 0548 09/29/22 0656  ALBUMIN 2.0* 2.0*   No results for input(s): "LIPASE", "AMYLASE" in the last 168 hours. CBC: Recent Labs  Lab 09/27/22 0548 09/29/22 0656 09/30/22 1142 10/02/22 1234  WBC 13.2* 13.1* 12.9* 13.3*  NEUTROABS 10.9* 10.7* 10.6* 11.0*  HGB 7.7* 7.8* 8.2* 8.5*  HCT 24.7* 25.0* 26.1* 26.8*  MCV 83.2 83.3 82.3 84.0  PLT 274 267 283 352   Blood Culture    Component Value Date/Time   SDES BLOOD RIGHT ANTECUBITAL 09/10/2022 1516   SDES BLOOD LEFT ARM 09/10/2022 1516   SPECREQUEST  09/10/2022 1516    BOTTLES DRAWN AEROBIC AND ANAEROBIC Blood Culture adequate volume   SPECREQUEST  09/10/2022 1516    BOTTLES DRAWN AEROBIC AND ANAEROBIC Blood Culture adequate volume   CULT  09/10/2022 1516    NO GROWTH 5 DAYS Performed at [facility], [address], [place], [place] [address]    CULT  09/10/2022 1516    NO GROWTH 5 DAYS Performed  at [facility], [address], [place], [place] [address]    REPTSTATUS 09/15/2022 FINAL 09/10/2022 1516   REPTSTATUS 09/15/2022 FINAL 09/10/2022 1516    Cardiac Enzymes: No results for input(s): "CKTOTAL", "CKMB", "CKMBINDEX", "TROPONINI" in the last 168 hours. CBG: Recent Labs  Lab 09/29/22 0602  GLUCAP 102*   Iron Studies: No results for input(s): "IRON", "TIBC", "TRANSFERRIN", "FERRITIN" in the last 72 hours. @lablastinr3 @ Studies/Results: DG CHEST PORT 1 VIEW  Result Date: 10/02/2022 CLINICAL DATA:  Congestion and cough EXAM: PORTABLE CHEST 1 VIEW COMPARISON:  09/22/2022 FINDINGS: Cardiac shadow is enlarged but stable. Dialysis catheter is now seen on the left in satisfactory position. The overall inspiratory effort is poor with crowding of the vascular markings. No focal infiltrate or sizable effusion is noted. IMPRESSION: Poor inspiratory effort.  No acute abnormality noted. Electronically Signed   By: Alcide Clever M.D.   On: 10/02/2022 11:43   Medications:   ceFAZolin (ANCEF) IV Stopped (09/30/22 2120)    ceFAZolin (ANCEF) IV 3 g (09/28/22 0123)    calcitRIOL  0.5 mcg Oral Q T,Th,Sat-1800   Chlorhexidine Gluconate Cloth  6 each Topical Q12H   cinacalcet  30 mg Oral Q supper  darbepoetin (ARANESP) injection - DIALYSIS  100 mcg Subcutaneous Q Thu-1800   feeding supplement (NEPRO CARB STEADY)  237 mL Oral BID BM   heparin  5,000 Units Subcutaneous Q8H   methocarbamol  250 mg Oral BID   midodrine  10 mg Oral Q T,Th,Sa-HD   midodrine  10 mg Oral Once per day on Tue Thu Sat   And   midodrine  5 mg Oral 2 times per day on Tue Thu Sat   And   midodrine  5 mg Oral 3 times per day on Sun Mon Wed Fri   multivitamin  1 tablet Oral QHS   pantoprazole  40 mg Oral Daily   sucroferric oxyhydroxide  1,000 mg Oral TID WC   traZODone  150 mg Oral QHS     Dialysis Orders: [facility] TTS  3:45 450/A1.5x 2K/2Ca EDW 85.7kg TDC  -Heparin 3000 units IV TIW  -No ESA -Calcitriol 1.5  MCG PO TIW   Assessment/Plan: 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 ID following. Blood Cx 6/16 MSSA, negative Cx on 6/17, 6/19. TDC removed 6/18. Completed 72 hour line holiday. TTE concerning for endocarditis. TEE cancelled d/t high risk factors. LS MRI with possible L5-S1 discitis. Temp cath placed 6/21, but poor function requiring exchange - see below. Will be getting IV Cefazolin 2g q HD x 6 weeks (until 11/04/22). Dialysis Access: RIJ TDC removed 6/18 - s/p line holiday, then temp line placed 6/21 with recurrent non-function issues despite exchange 6/26. S/p [facility] 7/1 with IR.  SBO - on CT Ab 6/19. Now resolved, surgery team has signed off. Hyperkalemia: K+ 5.2 today. He was on regular diet. Changed to renal diet.Use 2.0 K bath. Follow K+ levels.  Hyponatremia: No evidence of volume overload with hypotension present. He is under OP EDW. Will run even today, give NS @ 85 cc/Hr for next day. Follow labs.  ESRD: Usual TTS schedule - then line issues preventing HD 6/27 -7/1. Now resolved and HD 7/1, 7/2 to correct. Tolerated HD Th w/ 3.5L net UF and 3L on 7/6. Appears to be euvolemic today; EDW is likely ~75.2-76kg.   Next HD 10/02/2022 Perm Access: S/p L AVF ligation in 2019. Last seen by Dr. Edilia Bo 01/2022 - limited mobility in R arm so planning for L arm graft but had not scheduled surgery yet.   Hypertension/volume: BP better, Not overtly volume overloaded by exam however sodium is falling. BP soft, not on antihypertensive meds. Start NS, give 1 liter IVF now. Start midodrine 10 mg PO three times per week with HD.  UF as tolerated. Will need lower EDW on discharge. 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 have increased next dose. Metabolic bone disease: CorrCa ok, Phos initially 7.5.Binder changed to Velphoro - have ^d dose to 2/meals.  Nutrition - Renal diet, Alb low - continue supplements Dispo - CIR  Anett Ranker H. Mathew Postiglione NP-C 10/02/2022,  1:04 PM  [employer] [phone]

## 2022-10-02 NOTE — Progress Notes (Addendum)
PROGRESS NOTE   Subjective/Complaints:  Pt reports doesn't feel bad when BP drops- but might be a little tired.   Feeling like having a cough- denies that it's productive, but bothersome. Mild SOB.   SBP was a low as 77 yesterday afternoon- midodrine increased to TID per PA.   Slept well- less wake up.  Pain doing better- with 7.5-10 mg Oxy prn- taking slightly less.    ROS:   Pt denies: (+) SOB, abd pain, CP, N/V/C/D, and vision changes- intermittent cough (+)    Except for HPI  Objective:   No results found. Recent Labs    09/30/22 1142  WBC 12.9*  HGB 8.2*  HCT 26.1*  PLT 283   Recent Labs    09/30/22 1142 10/02/22 0735  NA 126* 126*  K 6.0* 5.2*  CL 90* 90*  CO2 20* 23  GLUCOSE 105* 90  BUN 94* 61*  CREATININE 10.14* 8.75*  CALCIUM 8.5* 8.4*   No intake or output data in the 24 hours ending 10/02/22 0958       Physical Exam: Vital Signs Blood pressure 95/62, pulse 88, temperature 98.5 F (36.9 C), temperature source Oral, resp. rate 16, height 5\' 11"  (1.803 m), weight 76.6 kg, SpO2 92%.       General: awake, alert, appropriate, pulled up in bed with PT; intermittent/pretty frequent cough- nonproductive; NAD HENT: conjugate gaze; oropharynx moist CV: regular rate and rhythm; no JVD Pulmonary: coughing intermittently- nonproductive- decreased throughout but worse at bases GI: soft, NT, ND, (+)BS- normoactive Psychiatric: appropriate Neurological: Ox3  PRIOR EXAMS: Skin: + L upper chest HD catheter - dressing c/d/i + Nonpainful erythematous lesions on bilateral feet  MSK:      No apparent deformity.      Strength: Exam limited d/t being on HD; all 4 limbs equal, antigravity and against resistance   Neurologic exam:  Cognition: AAO to person, place, time and event.  + Mild lethargy - intermittently falling asleep during late dialysis; more awake in early exam Language: Fluent,  No substitutions or neoglisms. No dysarthria. Names 3/3 objects correctly.  Memory: Recalls 3/3 objects at 5 minutes. No apparent deficits  Insight: Good  insight into current condition.  Mood: Pleasant affect, appropriate mood.  Sensation: To light touch intact in BL UEs and LEs  Reflexes: 1+ in BL UE and LEs. Negative Hoffman's and babinski signs bilaterally.  CN: 2-12 grossly intact.  Coordination: No apparent tremors. No ataxia on FTN, HTS bilaterally.  Spasticity: MAS 0 in all extremities.       Assessment/Plan: 1. Functional deficits which require 3+ hours per day of interdisciplinary therapy in a comprehensive inpatient rehab setting. Physiatrist is providing close team supervision and 24 hour management of active medical problems listed below. Physiatrist and rehab team continue to assess barriers to discharge/monitor patient progress toward functional and medical goals  Care Tool:  Bathing    Body parts bathed by patient: Right arm, Left arm, Chest, Abdomen, Front perineal area, Buttocks, Right upper leg, Left upper leg, Right lower leg, Left lower leg, Face         Bathing assist Assist Level: Contact Guard/Touching assist  Upper Body Dressing/Undressing Upper body dressing   What is the patient wearing?: Pull over shirt    Upper body assist Assist Level: Supervision/Verbal cueing    Lower Body Dressing/Undressing Lower body dressing            Lower body assist Assist for lower body dressing: Contact Guard/Touching assist     Toileting Toileting    Toileting assist Assist for toileting: Contact Guard/Touching assist     Transfers Chair/bed transfer  Transfers assist     Chair/bed transfer assist level: Contact Guard/Touching assist     Locomotion Ambulation   Ambulation assist      Assist level: Minimal Assistance - Patient > 75% Assistive device: Walker-rolling Max distance: 170'   Walk 10 feet activity   Assist     Assist  level: Minimal Assistance - Patient > 75% Assistive device: Walker-rolling   Walk 50 feet activity   Assist    Assist level: Minimal Assistance - Patient > 75% Assistive device: Walker-rolling    Walk 150 feet activity   Assist    Assist level: Minimal Assistance - Patient > 75% Assistive device: Walker-rolling    Walk 10 feet on uneven surface  activity   Assist Walk 10 feet on uneven surfaces activity did not occur: Safety/medical concerns         Wheelchair     Assist Is the patient using a wheelchair?: Yes Type of Wheelchair: Manual    Wheelchair assist level: Dependent - Patient 0% (pain limits)      Wheelchair 50 feet with 2 turns activity    Assist        Assist Level: Dependent - Patient 0%   Wheelchair 150 feet activity     Assist      Assist Level: Dependent - Patient 0%   Blood pressure 95/62, pulse 88, temperature 98.5 F (36.9 C), temperature source Oral, resp. rate 16, height 5\' 11"  (1.803 m), weight 76.6 kg, SpO2 92%.  Medical Problem List and Plan: 1. Functional deficits secondary to lumbar osteomyelitis/diskitis d/t MSSA bacteremia.heart vegetations/Endocarditis              -patient may shower             -ELOS/Goals: PT/OT/SLP supervision to mod I, 10-14 days  -d/c 7/19  Con't CIR PT and OT  Limited by midl SOB, cough, and low BP/orthostasis.  2.  Antithrombotics: -DVT/anticoagulation:  Pharmaceutical: Heparin 5000 q8h             -antiplatelet therapy: N/A 3. Low back pain/Pain Management: tylenol, Oxycodone prn. Advised to use oxycodone prior to sleep --will schedule low dose robaxin 250mg  bid as reports ongoing lower back and LE spasms.  -7/5- will increase Oxy to 10-15 mg q4 hours prn-can add long acting over weekend if necessary -7/6-7/24 pain seems controlled for now, monitor 7/8-7/9 pt reports pain doing much better- con't regimen 4. Mood/Behavior/Sleep: LCSW to follow for evaluation and support.               -antipsychotic agents: N/A --acute on chronic insomnia in part due to anxiety. Will schedule low dose trazodone. Continue prn melatonin -7/5- will change trazodone to 75 mg at bedtime- and can be titrated more over weekend if needed.  7/9- sleeping well  7/10- didn't sleep well- will increase trazodone to 150 mg QHS 5. Neuropsych/cognition: This patient is capable of making decisions on his own behalf. 6. Skin/Wound Care: Routine pressure relief measures -continue to monitor new lesions  on bilateral feet for evolution/progress   7. Fluids/Electrolytes/Nutrition: Strict I/O. Daily weights. Labs TTS with HD. Add low salt restrictions             --continue Nephro supplements 8. 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  --IV cefazolin with HD. EOT 11/04/22              --Fevers have resolved. CRP-44/Sed rate 1.5 @ admission.  --Leucocytosis trending down from 23.9-->31.4-->13.9.    -7/5- Pt's WBC down to 13/9- will recheck labs in AM as well as qmonday -09/27/22 WBC 13.2, cont to monitor  7/8- WBC stable at 13k- CRP 17.3 and ESR >140- will call ID- they said ESR not real helpful in ESRD pt's- so to make sure CRP improving and pt looks good, to call them back before pt leaves.  Called, since ESR rising- last checked 3 weeks ago and WBC won't resolve. 7/9- ID ok with labs since ESR not a good indicator in ESRD- follow labs weekly.   9. SBO w/ enteritis: Has resolved. Surgery team signed off 10. ESRD: HD TTS at the end of the day to help with tolerance of therapy.              --strict I/O with daily weights. Continues to require 4 L oxygen per [place].  --On Liberalized diet but 1200 cc FR/4 gram salt.  Velphoro for elevated phos level. -7/5- Na down to 128- per Renal  -09/27/22 dialysis today 7/9- K+_ 5.3- this AM and Na 127- per renal to intervene- they will likely check labs  and follow up 7/10- K+ up, but then held HD yesterday. Na 126-  7/11- Na 126 and K_ 5.2 still- also d/w pt with renal about midodrine- per renal.  11. Anemia of chronic disease: On Aranesp weekly every Thursday w/HD.  12. HTN: Monitor BP TID. On low dose tenormin 12.5mg  every day but BP with drops during HD.  -7/5- BP running low 90s systolic when laying down- I'm concerned will have orthostatic hypotension-will check with PT/OT  -09/27/22 BPs soft, monitor closely -09/28/22 BP a little softer today, hold atenolol this morning, monitor to see if changes need to be made to that. Denies orthostatic symptoms.  7/8- held atenolol- BP dropped to 60-70's systolic yesterday- was 90s SBP this AM- will monitor- midodrine increased yesterday to 2.5 mg 7/9- will hold atenolol since BP 104 systolic and going to HD today.   7/10- stopped Atenolol and increased Midodrine to 5 mg BID with breakfast/lunch 7/11- increased midodrine to 5 mg TID- but think he might benefit from 10 mg since BP has been running 70s with sitting/standing with therapy.  Vitals:   09/30/22 1630 09/30/22 1645 09/30/22 1700 09/30/22 1715  BP: 109/74 103/73 111/76 112/66   09/30/22 1740 09/30/22 1900 10/01/22 0407 10/01/22 0625  BP: 92/60 (!) 100/52 (!) 139/105 92/67   10/01/22 1400 10/01/22 1619 10/01/22 2041 10/02/22 0637  BP: (!) 89/58 92/61 100/61 95/62    13. AHRF d/t cavitary pneumonia. Wean oxygen as able. Currently 2-4L; per patient not using CPAP/BiPAP   -7/5- will wean O2 as allowed  -09/27/22 no O2 on today, monitor 14. Constipation:  -09/28/22 had a small BM yesterday, lots of gas, will do dose of miralax now then add on QD PRN dose, advised to take senna with the miralax dose now, then could use the PRN miralax dose after lunch  if still not successful-- if not working, then use suppository +/- enema.  7/8- LBM  7/6- small- feels like needs to go- wants to wait on more intervention.   7/9- Had good sized BM yesterday- feeling  better  7/10- bowels "going ok".  15. New cough/mild SOB  7/11- got CXR_ concern about fluid overload/heart decline in working? Due to enlarged cardiac silhouette and appears like effusion/possible infiltrate- Called IM consult to address- CXR read pending.   I spent a total of  59  minutes on total care today- >50% coordination of care- due to  D/w renal about Midodrine- also called IM consult due to CXR concerning-looked at independently- - looks like effusion and heart more enlarged- waiting for read-  Also d/w PA about pt's cough/orthostatic hypotension. Spoke with IM- ordered BNP- and procalcitonin and CBC with diff per IM.   LOS: 7 days A FACE TO FACE EVALUATION WAS PERFORMED  Catricia Scheerer 10/02/2022, 9:58 AM

## 2022-10-02 NOTE — Consult Note (Signed)
Initial Consultation Note   Patient: Ryan [name] ZOX:[id number] DOB: Apr 22, 1960 PCP: Sharin Grave, MD DOA: 09/25/2022 DOS: the patient was seen and examined on 10/02/2022 Primary service: Genice Rouge, MD  Referring physician: Genice Rouge, MD Reason for consult: Cough  Assessment/Plan: Assessment and Plan:  L5/S1 discitis and osteomyelitis, MSSA bacteremia with mitral valve endocarditis, septic emboli to the lungs, septic emboli to the brain: Prior to arrival.  Blood cultures positive for MSSA, repeat cultures negative since 6/17.  Possibly secondary to line infection with septic emboli to the lung. MRI with L5/S1 early osteomyelitis. Cefazolin until 8/13 as per ID recommendation. TTE with mitral valve endocarditis, CT scan of the chest with septic emboli, new airspace opacity left upper lobe.  MRI brain with scattered emboli CT surgery recommended conservative management. -Continue cefazolin as previously recommended by ID  Leukocytosis Persistent.  White blood cell count 13.3 which appears similar to priors.  Patient however has not had any significant fevers to suggest worsening infection at this time. -Continue with current antibiotic regimen -Recheck blood cultures if patient develops fever  Cough Patient reports having persistent cough.  Chest x-ray was up for inspiratory effort, but did not note any acute abnormality.  Procalcitonin was noted to be elevated at 5.26, but prior baseline to compare.  Cough could be related to history of septic emboli to the lung, volume overload(patient noted some improvement in his cough after dialysis), atelectasis, and/or possible infection.  Patient made note that he sometimes intermittently gets choked up with certain foods, but has been cleared by speech therapy. -Aspiration precautions with elevation head of bed -Encourage incentive spirometry and flutter valve -Check sputum culture if able to produce sputum -Mucinex scheduled to try and  induce sputum  ESRD on hemodialysis Hyperkalemia Nephrology following.  Potassium elevated at 5.2.  Previous catheter removed.  Now has tunneled HD catheter placed on 7/1.   -HD per nephrology   Anemia of chronic disease Hemoglobin 8.5 which appears near patient's baseline.   Small bowel obstruction Resolved  TRH will continue to follow the patient.  HPI: Ryan [name] is a 62 y.o. male with past medical history of hypertension, hyperlipidemia, and ESRD on hemodialysis who initially presented to the hospital on 6/6 with complaints of low back pain ultimately found to have evidence of L5/S1 discitis and osteomyelitis as well as MSSA bacteremia with mitral valve endocarditis.  CT scan of the chest noted septic emboli with new airspace opacities in the left upper lobe . MRI brain noting scattered subcentimeter infarcts involving the bilateral cerebral hemisphere and right cerebellum.  CT surgery has been consulted but recommended conservative management as.  He was already responding to antibiotics.  Plan was for patient to continue on antibiotics at cefazolin until 8/13 per ID recommendations.  Hospital course had also been complicated by small bowel obstruction which has since resolved.  Patient had been discharged to inpatient rehab on 7/4.  Patient reports that he has had an intermittent cough that is mostly nonproductive that started during his hospitalization.  Reports cough seems to worsen with talking.  He has intermittently gotten choked up with certain foods.  His last bowel movement was approximately 2 days ago.  Chest x-ray noted poor inspiratory effort with no acute abnormality noted.  Labs from today note WBC 13.3, hemoglobin 8.5, sodium 126, potassium 5.2, BUN 61, creatinine 8.75, and BNP 355.    Review of Systems: As mentioned in the history of present illness. All other systems reviewed and  are negative. Past Medical History:  Diagnosis Date   Allergy    Anemia    Blood  transfusion without reported diagnosis    Dialysis patient (HCC)    Tues,thurs,sat   ESRD (end stage renal disease) (HCC)    TTHS-    Family history of adverse reaction to anesthesia    father had allergic reaction with anesthesia with a dental procedure-(pt. doesn't know)   Hyperlipidemia    Hypertension    Past Surgical History:  Procedure Laterality Date   A/V FISTULAGRAM Left 12/15/2016   Procedure: A/V Fistulagram - left;  Surgeon: Chuck Hint, MD;  Location: [facility] INVASIVE CV LAB;  Service: Cardiovascular;  Laterality: Left;   AV FISTULA PLACEMENT Left 08/28/2016   Procedure: LEFT UPPER  ARM ARTERIOVENOUS (AV) FISTULA CREATION;  Surgeon: Chuck Hint, MD;  Location: [facility] OR;  Service: Vascular;  Laterality: Left;   DIALYSIS/PERMA CATHETER INSERTION Right 12/21/2017   Procedure: INSERTION OF DIALYSIS CATHETER Right Internal Jugular .;  Surgeon: Sherren Kerns, MD;  Location: [facility] OR;  Service: Vascular;  Laterality: Right;   FISTULA SUPERFICIALIZATION Left 09/23/2017   Procedure: FISTULA PLICATION LEFT ARM;  Surgeon: Sherren Kerns, MD;  Location: [facility] OR;  Service: Vascular;  Laterality: Left;   FISTULOGRAM Left 12/21/2017   Procedure: FISTULOGRAM with Balloon Angioplasty.;  Surgeon: Sherren Kerns, MD;  Location: [facility] OR;  Service: Vascular;  Laterality: Left;   HEMATOMA EVACUATION Left 08/27/2020   Procedure: EVACUATION HEMATOMA LEFT ARM;  Surgeon: Sherren Kerns, MD;  Location: [facility] OR;  Service: Vascular;  Laterality: Left;   IR FLUORO GUIDE CV LINE LEFT  09/22/2022   IR FLUORO GUIDE CV LINE RIGHT  09/12/2022   IR FLUORO GUIDE CV LINE RIGHT  09/17/2022   IR REMOVAL TUN CV CATH W/O FL  09/09/2022   IR THORACENTESIS ASP PLEURAL SPACE W/IMG GUIDE  08/22/2017   1.2 L -right-sided   IR THORACENTESIS ASP PLEURAL SPACE W/IMG GUIDE  10/19/2017   IR US GUIDE VASC ACCESS LEFT  09/22/2022   IR US GUIDE VASC ACCESS RIGHT  09/12/2022   KIDNEY TRANSPLANT     REVISON OF  ARTERIOVENOUS FISTULA Left 12/21/2017   Procedure: PLICATION and Ligation of F LEFT ARM ARTERIOVENOUS FISTULA;  Surgeon: Sherren Kerns, MD;  Location: [facility] OR;  Service: Vascular;  Laterality: Left;   REVISON OF ARTERIOVENOUS FISTULA Left 07/16/2020   Procedure: EXCISION OF LEFT ARM ARTERIOVENOUS FISTULA;  Surgeon: Sherren Kerns, MD;  Location: [facility] OR;  Service: Vascular;  Laterality: Left;   stent in kidneys     dec 2017   TRANSTHORACIC ECHOCARDIOGRAM  09/22/2017    Severe LVH.  Normal function -EF 55-60%.  GRII DD.  Moderate RV dilation with mildly reduced RV function.   Fixed right coronary cusp with very mild aortic stenosis.  The myocardium has a speckled appearance --> .   Recommend cardiac MRI to evaluate for amyloid   UPPER EXTREMITY VENOGRAPHY Bilateral 06/01/2020   Procedure: UPPER EXTREMITY VENOGRAPHY;  Surgeon: Chuck Hint, MD;  Location: [facility] INVASIVE CV LAB;  Service: Cardiovascular;  Laterality: Bilateral;   Social History:  reports that he has never smoked. He has never used smokeless tobacco. He reports that he does not drink alcohol and does not use drugs.  Allergies  Allergen Reactions   Iodinated Contrast Media Itching and Nausea Only    Probably needs prednisone prep prior to contrast   Z-Pak [Azithromycin] Nausea And Vomiting and Other (See  Comments)    Chest tightness GI Intolerance    Family History  Problem Relation Age of Onset   Hypertension Mother    Heart disease Mother 30       By his report, he thinks that she had heart attack.   Stroke Mother    Cancer Father    Kidney cancer Father    Hypertension Sister    Heart disease Maternal Grandmother    Alcohol abuse Maternal Grandfather    Mental illness Paternal Grandmother    Learning disabilities Paternal Grandmother        Alzheimer's    Stroke Paternal Grandfather    Colon cancer Neg Hx    Colon polyps Neg Hx    Esophageal cancer Neg Hx    Rectal cancer Neg Hx    Stomach cancer Neg  Hx     Prior to Admission medications   Medication Sig Start Date End Date Taking? Authorizing Provider  atenolol (TENORMIN) 25 MG tablet Take 50 mg by mouth daily.    [provider]  atorvastatin (LIPITOR) 40 MG tablet Take 40 mg by mouth daily.    [provider]  calcitRIOL (ROCALTROL) 0.5 MCG capsule Take 1 capsule (0.5 mcg total) by mouth every Tuesday, Thursday, and Saturday at 6 PM. 09/25/22   Dorcas Carrow, MD  ceFAZolin (ANCEF) IVPB Inject 2 g into the vein Every Tuesday,Thursday,and Saturday with dialysis. Indication:  MSSA discitis/osteo First Dose: Yes Last Day of Therapy:  11/04/22 Labs - Once weekly:  CBC/D and BMP, ESR and CRP Method of administration: Per HD protocol Method of administration may be changed at the discretion of home infusion pharmacist based upon assessment of the patient and/or caregiver's ability to self-administer the medication ordered. 09/16/22 11/04/22  Danelle Earthly, MD  chlorproMAZINE (THORAZINE) 25 MG tablet Take 1 tablet (25 mg total) by mouth 3 (three) times daily as needed for hiccoughs. 09/25/22   Dorcas Carrow, MD  cinacalcet (SENSIPAR) 30 MG tablet Take 30 mg by mouth at bedtime. Patient not taking: Reported on 09/08/2022 12/08/17   [provider]  EPINEPHrine 0.3 mg/0.3 mL IJ SOAJ injection Inject 0.3 mg into the muscle as needed for anaphylaxis. 06/08/21   Redwine, Madison A, PA-C  loratadine (CLARITIN) 10 MG tablet Take 10 mg by mouth daily.    [provider]  multivitamin (RENA-VIT) TABS tablet Take 1 tablet by mouth daily.    [provider]  oxyCODONE (OXY IR/ROXICODONE) 5 MG immediate release tablet Take 1 tablet (5 mg total) by mouth every 4 (four) hours as needed for moderate pain or severe pain. 09/25/22   Dorcas Carrow, MD  pantoprazole (PROTONIX) 40 MG tablet Take 1 tablet (40 mg total) by mouth daily. 09/25/22   Dorcas Carrow, MD  sucroferric oxyhydroxide (VELPHORO) 500 MG chewable tablet Chew 2  tablets (1,000 mg total) by mouth 3 (three) times daily with meals. 09/25/22   Dorcas Carrow, MD  tetrahydrozoline-zinc (VISINE-AC) 0.05-0.25 % ophthalmic solution Place 2 drops into both eyes 3 (three) times daily as needed (dry eyes).    [provider]  triamcinolone cream (KENALOG) 0.1 % Apply 1 application topically daily as needed (dry spots on face/excema).    [provider]    Physical Exam: Vitals:   10/02/22 1700 10/02/22 1730 10/02/22 1736 10/02/22 1745  BP: 130/68 115/70 117/65 123/68  Pulse: (!) 108 (!) 113 (!) 112 (!) 110  Resp: 19 (!) 25 (!) 21 18  Temp:    98.7 F (  37.1 C)  TempSrc:      SpO2: 100% 95% 98% 97%  Weight:      Height:        Constitutional: Elderly male currently in no acute distress Eyes: PERRL, lids and conjunctivae normal ENMT: Mucous membranes are moist.  Neck: normal, supple,   Respiratory: Normal respiratory effort without significant wheezing or rhonchi appreciated. Cardiovascular: Regular rate and rhythm, no murmurs / rubs / gallops. No extremity edema.  Hemodialysis catheter of the left chest wall. Abdomen: no tenderness, no masses palpated. Bowel sounds positive.  Musculoskeletal: no clubbing / cyanosis. No joint deformity upper and lower extremities. Good ROM, no contractures. Normal muscle tone.  Skin: no rashes, lesions, ulcers. No induration Neurologic: CN 2-12 grossly intact.  Able to move all extremities. Psychiatric: Normal judgment and insight. Alert and oriented x 3. Normal mood.   Data Reviewed:   Reviewed labs, imaging, and pertinent records as noted above in HPI.   Family Communication: None Primary team communication:   Thank you very much for involving [place] in the care of your patient.  Author: Clydie Braun, MD 10/02/2022 6:15 PM  For on call review [email or web address].

## 2022-10-03 DIAGNOSIS — R7881 Bacteremia: Secondary | ICD-10-CM | POA: Diagnosis not present

## 2022-10-03 DIAGNOSIS — N186 End stage renal disease: Secondary | ICD-10-CM | POA: Diagnosis not present

## 2022-10-03 DIAGNOSIS — Z992 Dependence on renal dialysis: Secondary | ICD-10-CM | POA: Diagnosis not present

## 2022-10-03 DIAGNOSIS — I120 Hypertensive chronic kidney disease with stage 5 chronic kidney disease or end stage renal disease: Secondary | ICD-10-CM | POA: Diagnosis not present

## 2022-10-03 DIAGNOSIS — D631 Anemia in chronic kidney disease: Secondary | ICD-10-CM | POA: Diagnosis not present

## 2022-10-03 DIAGNOSIS — N250 Renal osteodystrophy: Secondary | ICD-10-CM | POA: Diagnosis not present

## 2022-10-03 DIAGNOSIS — M4626 Osteomyelitis of vertebra, lumbar region: Secondary | ICD-10-CM | POA: Diagnosis not present

## 2022-10-03 DIAGNOSIS — B9561 Methicillin susceptible Staphylococcus aureus infection as the cause of diseases classified elsewhere: Secondary | ICD-10-CM | POA: Diagnosis not present

## 2022-10-03 LAB — RENAL FUNCTION PANEL
Albumin: 1.6 g/dL — ABNORMAL LOW (ref 3.5–5.0)
Anion gap: 13 (ref 5–15)
BUN: 27 mg/dL — ABNORMAL HIGH (ref 8–23)
CO2: 23 mmol/L (ref 22–32)
Calcium: 7.8 mg/dL — ABNORMAL LOW (ref 8.9–10.3)
Chloride: 92 mmol/L — ABNORMAL LOW (ref 98–111)
Creatinine, Ser: 5.67 mg/dL — ABNORMAL HIGH (ref 0.61–1.24)
GFR, Estimated: 11 mL/min — ABNORMAL LOW (ref >60)
Glucose, Bld: 87 mg/dL (ref 70–99)
Phosphorus: 3.8 mg/dL (ref 2.5–4.6)
Potassium: 4 mmol/L (ref 3.5–5.1)
Sodium: 128 mmol/L — ABNORMAL LOW (ref 135–145)

## 2022-10-03 MED ORDER — SODIUM CHLORIDE 0.9 % IV BOLUS
500.0000 mL | Freq: Once | INTRAVENOUS | Status: AC
  Administered 2022-10-03: 500 mL via INTRAVENOUS

## 2022-10-03 MED ORDER — SODIUM CHLORIDE 0.9 % IV SOLN: INTRAVENOUS | Status: DC

## 2022-10-03 MED ORDER — LORATADINE 10 MG PO TABS
10.0000 mg | ORAL_TABLET | Freq: Every day | ORAL | Status: DC
  Administered 2022-10-03 – 2022-10-09 (×7): 10 mg via ORAL
  Filled 2022-10-03 (×7): qty 1

## 2022-10-03 MED ORDER — MIDODRINE HCL 5 MG PO TABS: 10.0000 mg | ORAL_TABLET | ORAL | Status: DC

## 2022-10-03 MED ORDER — PROSOURCE PLUS PO LIQD
30.0000 mL | Freq: Three times a day (TID) | ORAL | Status: DC
  Administered 2022-10-03 – 2022-10-09 (×15): 30 mL via ORAL
  Filled 2022-10-03 (×16): qty 30

## 2022-10-03 MED ORDER — NEPRO/CARBSTEADY PO LIQD
237.0000 mL | Freq: Three times a day (TID) | ORAL | Status: DC
  Administered 2022-10-03 – 2022-10-08 (×15): 237 mL via ORAL

## 2022-10-03 MED ORDER — FLUTICASONE PROPIONATE 50 MCG/ACT NA SUSP
1.0000 | Freq: Every day | NASAL | Status: DC
  Administered 2022-10-03 – 2022-10-09 (×6): 1 via NASAL
  Filled 2022-10-03 (×2): qty 16

## 2022-10-03 MED ORDER — MIDODRINE HCL 5 MG PO TABS: 5.0000 mg | ORAL_TABLET | ORAL | Status: DC

## 2022-10-03 MED ORDER — BENZONATATE 100 MG PO CAPS
100.0000 mg | ORAL_CAPSULE | Freq: Two times a day (BID) | ORAL | Status: DC
  Administered 2022-10-03 – 2022-10-09 (×13): 100 mg via ORAL
  Filled 2022-10-03 (×13): qty 1

## 2022-10-03 MED ORDER — MIDODRINE HCL 5 MG PO TABS
10.0000 mg | ORAL_TABLET | Freq: Three times a day (TID) | ORAL | Status: DC
  Administered 2022-10-03 – 2022-10-09 (×18): 10 mg via ORAL
  Filled 2022-10-03 (×18): qty 2

## 2022-10-03 MED ORDER — CHLORHEXIDINE GLUCONATE CLOTH 2 % EX PADS: 6.0000 | MEDICATED_PAD | Freq: Every day | CUTANEOUS | Status: DC

## 2022-10-03 NOTE — Progress Notes (Signed)
[facility] Progress Note   Subjective: Continued issues with hypotension. Midodrine dose increased. Continue NS for another 24 hours. No C/Os.    Objective Vitals:   10/02/22 2015 10/02/22 2212 10/03/22 0358 10/03/22 0358  BP: 104/68 (!) 91/55 (!) 88/53 (!) 88/53  Pulse: (!) 102 (!) 108 98 100  Resp: 18 18 18 18   Temp:  98.7 F (37.1 C) 99 F (37.2 C) 99 F (37.2 C)  TempSrc:   Oral Oral  SpO2:  97%  93%  Weight:      Height:       Physical Exam General: Chronically ill appearing older male in NAD Heart: S1,S2 No M/R/G Lungs: CTAB A/P Abdomen: NABS, NT Extremities: No LE edema Dialysis Access: [facility] drsg intact   Additional Objective Labs: Basic Metabolic Panel: Recent Labs  Lab 09/29/22 0656 09/30/22 1142 10/02/22 0735 10/02/22 1234 10/03/22 0629  NA 127*   < > 126* 128* 128*  K 5.3*   < > 5.2* 5.3* 4.0  CL 90*   < > 90* 85* 92*  CO2 23   < > 23 24 23   GLUCOSE 97   < > 90 109* 87  BUN 68*   < > 61* 61* 27*  CREATININE 7.87*   < > 8.75* 9.09* 5.67*  CALCIUM 8.3*   < > 8.4* 8.9 7.8*  PHOS 4.4  --   --  4.5 3.8   < > = values in this interval not displayed.   Liver Function Tests: Recent Labs  Lab 09/29/22 0656 10/02/22 1234 10/03/22 0629  ALBUMIN 2.0* 2.1* 1.6*   No results for input(s): "LIPASE", "AMYLASE" in the last 168 hours. CBC: Recent Labs  Lab 09/27/22 0548 09/29/22 0656 09/30/22 1142 10/02/22 1234  WBC 13.2* 13.1* 12.9* 13.3*  NEUTROABS 10.9* 10.7* 10.6* 11.0*  HGB 7.7* 7.8* 8.2* 8.5*  HCT 24.7* 25.0* 26.1* 26.8*  MCV 83.2 83.3 82.3 84.0  PLT 274 267 283 352   Blood Culture    Component Value Date/Time   SDES BLOOD RIGHT ANTECUBITAL 09/10/2022 1516   SDES BLOOD LEFT ARM 09/10/2022 1516   SPECREQUEST  09/10/2022 1516    BOTTLES DRAWN AEROBIC AND ANAEROBIC Blood Culture adequate volume   SPECREQUEST  09/10/2022 1516    BOTTLES DRAWN AEROBIC AND ANAEROBIC Blood Culture adequate volume   CULT  09/10/2022 1516     NO GROWTH 5 DAYS Performed at [facility], [address], [place], [place] [address]    CULT  09/10/2022 1516    NO GROWTH 5 DAYS Performed at [facility], [address] [place], [place] [address]    REPTSTATUS 09/15/2022 FINAL 09/10/2022 1516   REPTSTATUS 09/15/2022 FINAL 09/10/2022 1516    Cardiac Enzymes: No results for input(s): "CKTOTAL", "CKMB", "CKMBINDEX", "TROPONINI" in the last 168 hours. CBG: Recent Labs  Lab 09/29/22 0602  GLUCAP 102*   Iron Studies: No results for input(s): "IRON", "TIBC", "TRANSFERRIN", "FERRITIN" in the last 72 hours. @lablastinr3 @ Studies/Results: DG CHEST PORT 1 VIEW  Result Date: 10/02/2022 CLINICAL DATA:  Congestion and cough EXAM: PORTABLE CHEST 1 VIEW COMPARISON:  09/22/2022 FINDINGS: Cardiac shadow is enlarged but stable. Dialysis catheter is now seen on the left in satisfactory position. The overall inspiratory effort is poor with crowding of the vascular markings. No focal infiltrate or sizable effusion is noted. IMPRESSION: Poor inspiratory effort.  No acute abnormality noted. Electronically Signed   By: Alcide Clever M.D.   On: 10/02/2022 11:43  Medications:  sodium chloride 75 mL/hr at 10/03/22 0909    ceFAZolin (ANCEF) IV 2 g (10/02/22 1909)    ceFAZolin (ANCEF) IV 3 g (09/28/22 0123)    benzonatate  100 mg Oral BID   calcitRIOL  0.5 mcg Oral Q T,Th,Sat-1800   Chlorhexidine Gluconate Cloth  6 each Topical Q12H   cinacalcet  30 mg Oral Q supper   darbepoetin (ARANESP) injection - DIALYSIS  100 mcg Subcutaneous Q Thu-1800   feeding supplement (NEPRO CARB STEADY)  237 mL Oral BID BM   fluticasone  1 spray Each Nare Daily   guaiFENesin  600 mg Oral BID   heparin  5,000 Units Subcutaneous Q8H   loratadine  10 mg Oral Daily   methocarbamol  250 mg Oral BID   midodrine  10 mg Oral Q T,Th,Sa-HD   [START ON 10/04/2022] midodrine  10 mg Oral Once per day on Tuesday Thursday Saturday   And   [START ON 10/04/2022] midodrine   5 mg Oral 2 times per day on Tuesday Thursday Saturday   And   midodrine  10 mg Oral 3 times per day on Sunday Monday Wednesday Friday   multivitamin  1 tablet Oral QHS   pantoprazole  40 mg Oral Daily   sucroferric oxyhydroxide  1,000 mg Oral TID WC   traZODone  150 mg Oral QHS     Dialysis Orders: [facility] TTS  3:45 450/A1.5x 2K/2Ca EDW 85.7kg TDC  -Heparin 3000 units IV TIW  -No ESA -Calcitriol 1.5 MCG PO TIW   Assessment/Plan: 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 ID following. Blood Cx 6/16 MSSA, negative Cx on 6/17, 6/19. TDC removed 6/18. Completed 72 hour line holiday. TTE concerning for endocarditis. TEE cancelled d/t high risk factors. LS MRI with possible L5-S1 discitis. Temp cath placed 6/21, but poor function requiring exchange - see below. Will be getting IV Cefazolin 2g q HD x 6 weeks (until 11/04/22). Dialysis Access: RIJ TDC removed 6/18 - s/p line holiday, then temp line placed 6/21 with recurrent non-function issues despite exchange 6/26. S/p [facility] 7/1 in IR.  SBO - on CT Ab 6/19. Now resolved, surgery team has signed off. Hyperkalemia: K+ 4.0 today. He was on regular diet. Changed to renal diet. Use 2.0 K bath. Follow K+ levels.  Hyponatremia: NA 128. No evidence of volume overload with hypotension present. He is under OP EDW. On NS infusion for 24 hours. Run even with HD 10/04/2022. Follow labs.  ESRD: Usual TTS schedule - then line issues preventing HD 6/27 -7/1. Now resolved and HD 7/1, 7/2 to correct. Tolerated HD Th w/ 3.5L net UF and 3L on 7/6. Appears to be euvolemic today; EDW is likely ~75.2-76kg.   Next HD 10/04/2022 Perm Access: S/p L AVF ligation in 2019. Last seen by Dr. Edilia Bo 01/2022 - limited mobility in R arm so planning for L arm graft but had not scheduled surgery yet.   Hypotension/volume: BP better, Not overtly volume overloaded by exam however sodium is falling. BP soft, not on antihypertensive meds. Started NS,  given 1 liter with HD 10/02/2022 with additional IVF overnight. Will continue for another 24 hours. Midodrine dose is too complicated. Change midodrine to 10 mg PO TID.  UF as tolerated. Will need lower EDW on discharge. 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 have increased next dose. Metabolic bone disease: CorrCa ok, Phos initially 7.5.Binder changed to Velphoro now at goal. Increased dose to 2 tabs PO  TID AC.  Nutrition - Renal diet, Alb low - continue supplements Dispo - CIR  Corinthian Mizrahi H. Riggs Dineen NP-C 10/03/2022, 10:39 AM  [employer] [phone]

## 2022-10-03 NOTE — Progress Notes (Signed)
Initial Nutrition Assessment  DOCUMENTATION CODES:   Not applicable  INTERVENTION:   -Discussed with nephrology; continue renal diet due to K levels, but ok for no fluid restriction -Continue renal MVI daily -Increase Nepro Shake po TID, each supplement provides 425 kcal and 19 grams protein  -30 ml Prosource Plus TID, each supplement provides 100 kcals and 15 gramss protein  NUTRITION DIAGNOSIS:   Inadequate oral intake related to poor appetite as evidenced by meal completion < 25%, per patient/family report.  GOAL:   Patient will meet greater than or equal to 90% of their needs  MONITOR:   PO intake, Supplement acceptance  REASON FOR ASSESSMENT:   Consult Assessment of nutrition requirement/status  ASSESSMENT:   with medical history significant for hypertension, hyperlipidemia, and ESRD on hemodialysis who presents to to [facility]22 with acute onset of low back pain. Pt admitted to CIR functional decline secondary to MSSA bacteremia with dissemination to lumbar spine with diskitis/ osteomyelitis, MV endocarditis, and to the brain.  Pt admitted to CIR functional decline secondary to MSSA bacteremia with dissemination to lumbar spine with diskitis/ osteomyelitis, MV endocarditis, and to the brain.   Reviewed I/O's: +809 ml x 24 hours and -2.5 L since admission   Spoke with pt over the phone, who reports feeling okay today. He endorses poor appetite, but shares that it is improving since admission to rehab. Noted meal completions 0-25%. Pt shares he did not eat much breakfast today, but is also tired of all the omelettes that he has been served (he reports that he prefers [place] toast and boiled eggs). Noted meal completions 0-25%. Pt shares that he has been adding a lot of hot sauce to his meals to help flavor his foods.   Per nephrology notes, EDW is 85.7. Wt has been stable since admission. Reviewed wt hx; pt has experienced a 8.7% wt loss over the past 8 months. While  this is not significant for time frame, it si concerning given prolonged hospitalization and multiple co-morbidities.   Pt shares that he thinks he has been losing weight consistently secondary to not eating well. He is drinking the Nepro supplements and is willing to continue them. Discussed importance of good meal and supplement intake to promote healing. Pt currently on renal diet with 1.2 L fluid restriction.   Noted that pt was identified with moderate malnutrition during acute care hospitalization. Suspect this is ongoing, but unable to identify at this time.   Case discussed with nephrology. No plans to liberalize diet at this time, as K levels had improved (from 4 to 6) since starting renal diet yesterday. Fluid restriction has been d/c; pt very appreciative of this and will be helpful secondary to good supplement acceptance.   Per TOC notes, target discharge is 10/10/22; plan to d/c home with home health and help of family.   Medications reviewed and include aranesp and 0.9% sodium chloride infusion @ 75 ml/hr.   Lab Results  Component Value Date   HGBA1C 6.3 (H) 09/19/2022   PTA DM medications are none.   Labs reviewed: Na: 128, K and Phos WDL. CBGS: 102 (inpatient orders for glycemic control are none).    Diet Order:   Diet Order             Diet renal with fluid restriction Room service appropriate? Yes; Fluid consistency: Thin  Diet effective now                   EDUCATION  NEEDS:   Education needs have been addressed  Skin:  Skin Assessment: Reviewed RN Assessment  Last BM:  09/30/22 (type 4)  Height:   Ht Readings from Last 1 Encounters:  09/25/22 5\' 11"  (1.803 m)    Weight:   Wt Readings from Last 1 Encounters:  10/02/22 77.4 kg    Ideal Body Weight:  78.2 kg  BMI:  Body mass index is 23.8 kg/m.  Estimated Nutritional Needs:   Kcal:  2400-2600  Protein:  115-130 grams  Fluid:  1000 ml + UOP    Levada Schilling, RD, LDN, CDCES Registered  Dietitian II Certified Diabetes Care and Education Specialist Please refer to [facility] for RD and/or RD on-call/weekend/after hours pager

## 2022-10-03 NOTE — Progress Notes (Signed)
PROGRESS NOTE    [name]  JOA:[id number] DOB: 06-16-1960 DOA: 09/25/2022 PCP: Sharin Grave, MD     Brief Narrative:  Ryan [name] is a 62 y.o. male with past medical history of hypertension, hyperlipidemia, and ESRD on hemodialysis who initially presented to the hospital on 6/6 with complaints of low back pain ultimately found to have evidence of L5/S1 discitis and osteomyelitis as well as MSSA bacteremia with mitral valve endocarditis.  CT scan of the chest noted septic emboli with new airspace opacities in the left upper lobe . MRI brain noting scattered subcentimeter infarcts involving the bilateral cerebral hemisphere and right cerebellum.  CT surgery has been consulted but recommended conservative management as.  He was already responding to antibiotics.  Plan was for patient to continue on antibiotics at cefazolin until 8/13 per ID recommendations.  Hospital course had also been complicated by small bowel obstruction which has since resolved.  Patient had been discharged to inpatient rehab on 7/4.  Patient reports that he has had an intermittent cough that is mostly nonproductive that started during his hospitalization.  Reports cough seems to worsen with talking.  He has intermittently gotten choked up with certain foods.  His last bowel movement was approximately 2 days ago.  Chest x-ray noted poor inspiratory effort with no acute abnormality noted.  Labs from today note WBC 13.3, hemoglobin 8.5, sodium 126, potassium 5.2, BUN 61, creatinine 8.75, and BNP 355.   New events last 24 hours / Subjective: Complains of an ongoing dry cough.  Cough seems to get worse when laying flat, unclear if this is postnasal drainage versus from septic emboli, fluid.  He notes that he does take Claritin at home.  Willing to try Flonase as needed.  He remains on room air  Assessment & Plan:   Principal Problem:   Acute osteomyelitis of lumbar spine (HCC) Active Problems:   Leukocytosis    Cough   ESRD on dialysis (HCC)   Hyperkalemia   Diskitis   Persistent dry cough -Could be in setting of postnasal drainage, septic emboli, volume overload, atelectasis -Chest x-ray showed poor expiratory effort, no acute abnormality or focal infiltrate seen -No signs of pneumonia at this time or aspiration risk -Continue incentive strategy, flutter valve -Mucinex, Tessalon Perles -Claritin and Flonase -Volume management with dialysis  Other medical issues including: L5/S1 discitis and osteomyelitis, MSSA bacteremia with mitral valve endocarditis, septic emboli to the lungs, septic emboli to the brain Persistent leukocytosis in setting of infection as above ESRD on dialysis Anemia of chronic disease Small bowel obstruction, resolved -Patient remains on cefazolin until 8/13 -Dialysis per nephrology -Continue rehab efforts   [facility] will check back to see patient over the weekend   Antimicrobials:  Anti-infectives (From admission, onward)    Start     Dose/Rate Route Frequency Ordered Stop   09/30/22 1800  ceFAZolin (ANCEF) IVPB 2g/100 mL premix       Note to Pharmacy: can we give 3gm of ancef tomorrow instead of 2gm? (Only for satursdays)   2 g 200 mL/hr over 30 Minutes Intravenous Once per day on Tue Thu 09/26/22 1107 11/04/22 2359   09/27/22 1800  ceFAZolin (ANCEF) IVPB 2g/100 mL premix  Status:  Discontinued       Note to Pharmacy: can we give 3gm of ancef tomorrow instead of 2gm? (Only for satursdays)   2 g 200 mL/hr over 30 Minutes Intravenous Every T-Th-Sa (1800) 09/26/22 0809 09/26/22 1107   09/27/22 1800  ceFAZolin (  ANCEF) IVPB 3g/100 mL premix        3 g 200 mL/hr over 30 Minutes Intravenous Once per day on Sat 09/26/22 1107 11/01/22 2359   09/25/22 1800  ceFAZolin (ANCEF) IVPB 2g/100 mL premix  Status:  Discontinued        2 g 200 mL/hr over 30 Minutes Intravenous Every T-Th-Sa (1800) 09/25/22 1647 09/26/22 0809        Objective: Vitals:   10/02/22 2015  10/02/22 2212 10/03/22 0358 10/03/22 0358  BP: 104/68 (!) 91/55 (!) 88/53 (!) 88/53  Pulse: (!) 102 (!) 108 98 100  Resp: 18 18 18 18   Temp:  98.7 F (37.1 C) 99 F (37.2 C) 99 F (37.2 C)  TempSrc:   Oral Oral  SpO2:  97%  93%  Weight:      Height:        Intake/Output Summary (Last 24 hours) at 10/03/2022 1326 Last data filed at 10/03/2022 0749 Gross per 24 hour  Intake 1024.91 ml  Output 100 ml  Net 924.91 ml   Filed Weights   10/01/22 0408 10/02/22 0637 10/02/22 1317  Weight: 76.4 kg 76.6 kg 77.4 kg    Examination:  General exam: Appears calm and comfortable  Respiratory system: Clear to auscultation. Respiratory effort normal. No respiratory distress. No conversational dyspnea.  On room air Cardiovascular system: S1 & S2 heard, RRR. No murmurs. No pedal edema. Gastrointestinal system: Abdomen is nondistended, soft and nontender. Normal bowel sounds heard. Central nervous system: Alert and oriented. No focal neurological deficits. Speech clear.  Extremities: Symmetric in appearance  Skin: No rashes, lesions or ulcers on exposed skin  Psychiatry: Judgement and insight appear normal. Mood & affect appropriate.   Data Reviewed: I have personally reviewed following labs and imaging studies  CBC: Recent Labs  Lab 09/27/22 0548 09/29/22 0656 09/30/22 1142 10/02/22 1234  WBC 13.2* 13.1* 12.9* 13.3*  NEUTROABS 10.9* 10.7* 10.6* 11.0*  HGB 7.7* 7.8* 8.2* 8.5*  HCT 24.7* 25.0* 26.1* 26.8*  MCV 83.2 83.3 82.3 84.0  PLT 274 267 283 352   Basic Metabolic Panel: Recent Labs  Lab 09/27/22 0548 09/29/22 0656 09/30/22 1142 10/02/22 0735 10/02/22 1234 10/03/22 0629  NA 128* 127* 126* 126* 128* 128*  K 5.0 5.3* 6.0* 5.2* 5.3* 4.0  CL 88* 90* 90* 90* 85* 92*  CO2 25 23 20* 23 24 23   GLUCOSE 115* 97 105* 90 109* 87  BUN 75* 68* 94* 61* 61* 27*  CREATININE 8.46* 7.87* 10.14* 8.75* 9.09* 5.67*  CALCIUM 8.2* 8.3* 8.5* 8.4* 8.9 7.8*  PHOS 5.1* 4.4  --   --  4.5 3.8    GFR: Estimated Creatinine Clearance: 14.6 mL/min (A) (by C-G formula based on SCr of 5.67 mg/dL (H)). Liver Function Tests: Recent Labs  Lab 09/27/22 0548 09/29/22 0656 10/02/22 1234 10/03/22 0629  ALBUMIN 2.0* 2.0* 2.1* 1.6*   No results for input(s): "LIPASE", "AMYLASE" in the last 168 hours. No results for input(s): "AMMONIA" in the last 168 hours. Coagulation Profile: No results for input(s): "INR", "PROTIME" in the last 168 hours. Cardiac Enzymes: No results for input(s): "CKTOTAL", "CKMB", "CKMBINDEX", "TROPONINI" in the last 168 hours. BNP (last 3 results) No results for input(s): "PROBNP" in the last 8760 hours. HbA1C: No results for input(s): "HGBA1C" in the last 72 hours. CBG: Recent Labs  Lab 09/29/22 0602  GLUCAP 102*   Lipid Profile: No results for input(s): "CHOL", "HDL", "LDLCALC", "TRIG", "CHOLHDL", "LDLDIRECT" in the last 72 hours. Thyroid  Function Tests: No results for input(s): "TSH", "T4TOTAL", "FREET4", "T3FREE", "THYROIDAB" in the last 72 hours. Anemia Panel: No results for input(s): "VITAMINB12", "FOLATE", "FERRITIN", "TIBC", "IRON", "RETICCTPCT" in the last 72 hours. Sepsis Labs: Recent Labs  Lab 10/02/22 1234  PROCALCITON 5.26    No results found for this or any previous visit (from the past 240 hour(s)).    Radiology Studies: DG CHEST PORT 1 VIEW  Result Date: 10/02/2022 CLINICAL DATA:  Congestion and cough EXAM: PORTABLE CHEST 1 VIEW COMPARISON:  09/22/2022 FINDINGS: Cardiac shadow is enlarged but stable. Dialysis catheter is now seen on the left in satisfactory position. The overall inspiratory effort is poor with crowding of the vascular markings. No focal infiltrate or sizable effusion is noted. IMPRESSION: Poor inspiratory effort.  No acute abnormality noted. Electronically Signed   By: Alcide Clever M.D.   On: 10/02/2022 11:43      Scheduled Meds:  (feeding supplement) PROSource Plus  30 mL Oral TID BM   benzonatate  100 mg Oral  BID   calcitRIOL  0.5 mcg Oral Q T,Th,Sat-1800   Chlorhexidine Gluconate Cloth  6 each Topical Q12H   cinacalcet  30 mg Oral Q supper   darbepoetin (ARANESP) injection - DIALYSIS  100 mcg Subcutaneous Q Thu-1800   feeding supplement (NEPRO CARB STEADY)  237 mL Oral TID BM   fluticasone  1 spray Each Nare Daily   guaiFENesin  600 mg Oral BID   heparin  5,000 Units Subcutaneous Q8H   loratadine  10 mg Oral Daily   methocarbamol  250 mg Oral BID   midodrine  10 mg Oral TID WC   multivitamin  1 tablet Oral QHS   pantoprazole  40 mg Oral Daily   sucroferric oxyhydroxide  1,000 mg Oral TID WC   traZODone  150 mg Oral QHS   Continuous Infusions:  sodium chloride      ceFAZolin (ANCEF) IV 2 g (10/02/22 1909)    ceFAZolin (ANCEF) IV 3 g (09/28/22 0123)     LOS: 8 days   Time spent: 25 minutes   Noralee Stain, DO [facility] 10/03/2022, 1:26 PM   Available via Epic secure chat 7am-7pm After these hours, please refer to coverage provider listed on [email or web address]

## 2022-10-03 NOTE — Plan of Care (Signed)
Patient alert/oriented X4. Patient compliant with medication administration and tolerated cont. IV fluids. Patient complained of back pain earlier this shift from getting out of wheelchair into bed. Oxycodone administered as needed. Patient VSS, bed alarm on/bed in lowest position. Will continue to monitor.   Problem: Consults Goal: RH GENERAL PATIENT EDUCATION Description: See Patient Education module for education specifics. Outcome: Progressing   Problem: RH BOWEL ELIMINATION Goal: RH STG MANAGE BOWEL WITH ASSISTANCE Description: STG Manage Bowel with min Assistance. Outcome: Progressing   Problem: RH BLADDER ELIMINATION Goal: RH STG MANAGE BLADDER WITH ASSISTANCE Description: STG Manage Bladder With min Assistance Outcome: Progressing   Problem: Education: Goal: Knowledge of General Education information will improve Description: Including pain rating scale, medication(s)/side effects and non-pharmacologic comfort measures Outcome: Progressing   Problem: Health Behavior/Discharge Planning: Goal: Ability to manage health-related needs will improve Outcome: Progressing   Problem: Clinical Measurements: Goal: Ability to maintain clinical measurements within normal limits will improve Outcome: Progressing   Problem: Clinical Measurements: Goal: Will remain free from infection Outcome: Progressing   Problem: Clinical Measurements: Goal: Diagnostic test results will improve Outcome: Progressing   Problem: Clinical Measurements: Goal: Respiratory complications will improve Outcome: Progressing   Problem: Clinical Measurements: Goal: Cardiovascular complication will be avoided Outcome: Progressing   Problem: Activity: Goal: Risk for activity intolerance will decrease Outcome: Progressing   Problem: Nutrition: Goal: Adequate nutrition will be maintained Outcome: Progressing   Problem: Coping: Goal: Level of anxiety will decrease Outcome: Progressing   Problem:  Elimination: Goal: Will not experience complications related to bowel motility Outcome: Progressing   Problem: Elimination: Goal: Will not experience complications related to urinary retention Outcome: Progressing   Problem: Pain Managment: Goal: General experience of comfort will improve Outcome: Progressing   Problem: Safety: Goal: Ability to remain free from injury will improve Outcome: Progressing   Problem: Skin Integrity: Goal: Risk for impaired skin integrity will decrease Outcome: Progressing

## 2022-10-03 NOTE — Progress Notes (Signed)
Occupational Therapy Weekly Progress Note  Patient Details  Name: Ryan [name] MRN: [id number] Date of Birth: 06/12/1960  Beginning of progress report period: September 26, 2022 End of progress report period: September 03, 2022  Today's Date: 10/03/2022 OT Individual Time: 6295-2841 OT Individual Time Calculation (min): 75 min    Patient has met 3 of 3 short term goals.  Pt has demonstrated increased independence with ADLs such as bathing, LB dressing with AE, and increased activity tolerance. Barriers during IPR stay include low BP, hemodialysis, and low functional activity tolerance. Pt would benefit from continued skilled OT to maximize independence with ADLs and increase functional activity tolerance in order to D/C home with assistance.  Patient continues to demonstrate the following deficits: muscle weakness, decreased cardiorespiratoy endurance, and decreased sitting balance, decreased standing balance, and decreased balance strategies and therefore will continue to benefit from skilled OT intervention to enhance overall performance with BADL and Reduce care partner burden.  Patient progressing toward long term goals..  Continue plan of care.  OT Short Term Goals Week 1:  OT Short Term Goal 1 (Week 1): Pt will complete bathing at CGA in shower OT Short Term Goal 1 - Progress (Week 1): Met OT Short Term Goal 2 (Week 1): Pt will complete AE training to demonstrate LB dressing CGA OT Short Term Goal 2 - Progress (Week 1): Met OT Short Term Goal 3 (Week 1): Pt will complete standing activity with tolerance >5 minutes OT Short Term Goal 3 - Progress (Week 1): Met Week 2:  OT Short Term Goal 1 (Week 2): STG=LTG d/t ELOS  Skilled Therapeutic Interventions/Progress Updates:      Therapy Documentation Precautions:  Precautions Precautions: Fall Restrictions Weight Bearing Restrictions: No General: "Hi Ryan [name]" Pt seated in W/C upon OT arrival, agreeable to OT.  Vital Signs: BP upon  arrival: 86/55 seated position BP upon donning ACE wrap and TED hose: 95/66 seated position  Pain: 0/10 pain reported/noted   ADL: Grooming: SBA for washing face and shaving at sink in W/C Oral hygiene: SBA, able to reach anteriorly onto sink and retrieve toothbrush and tooth paste, able to manipulate containers UB dressing: SBA donning/doffing over head shirt seated at W/C level, able to don/doff back brace LB dressing: SBA  doffing/donning, seated in W/C to don pants over feet with reacher, standing SBA with RW to manage over hips Footwear: overall total A d/t ACE wraps and TED hose, however CGA to don socks with sock aide  Bathing: Min A sponge bathing d/t low BP this date, Pt seated at sink W/C level, able to wash/dry all all body parts with exception of feet  Transfers: SBA for all transfers completed this date with RW -Pt requiring increased time to complete tasks d/t cognitive processing delays and decreased motor planning   Exercises: Pt completed the following exercises in order to improve functional activity, strength and endurance to prepare for ADLs such as bathing. Pt completed the following exercises in seated position with no noted LOB/SOB and 3x10 repetitions on each exercise: -bicep curls -triceps extensions   Pt seated in W/C at end of session with W/C alarm donned, call light within reach and 4Ps assessed.    Therapy/Group: Individual Therapy  Velia Meyer, OTD, OTR/L 10/03/2022, 12:47 PM

## 2022-10-03 NOTE — Progress Notes (Signed)
PROGRESS NOTE   Subjective/Complaints:  Pt reports feels good this AM- BP 88 systolic this AM  Albumin down to 1.6- ate <25% of meal this AM.   Cough about the same- bothersome and hurts throat.  Not hungry this AM- but looks like doesn't eat much many mornings.  Got 1L of IVFs yesterday  due to low BP.   LBM x2 yesterday- 1x- waited too long and almost had accident, but made it in time- was in HD.     ROS:   Pt denies SOB, abd pain, CP, N/V/C/D, and vision changes   Except for HPI  Objective:   DG CHEST PORT 1 VIEW  Result Date: 10/02/2022 CLINICAL DATA:  Congestion and cough EXAM: PORTABLE CHEST 1 VIEW COMPARISON:  09/22/2022 FINDINGS: Cardiac shadow is enlarged but stable. Dialysis catheter is now seen on the left in satisfactory position. The overall inspiratory effort is poor with crowding of the vascular markings. No focal infiltrate or sizable effusion is noted. IMPRESSION: Poor inspiratory effort.  No acute abnormality noted. Electronically Signed   By: Alcide Clever M.D.   On: 10/02/2022 11:43   Recent Labs    09/30/22 1142 10/02/22 1234  WBC 12.9* 13.3*  HGB 8.2* 8.5*  HCT 26.1* 26.8*  PLT 283 352   Recent Labs    10/02/22 1234 10/03/22 0629  NA 128* 128*  K 5.3* 4.0  CL 85* 92*  CO2 24 23  GLUCOSE 109* 87  BUN 61* 27*  CREATININE 9.09* 5.67*  CALCIUM 8.9 7.8*    Intake/Output Summary (Last 24 hours) at 10/03/2022 1008 Last data filed at 10/03/2022 0749 Gross per 24 hour  Intake 1144.91 ml  Output 100 ml  Net 1044.91 ml         Physical Exam: Vital Signs Blood pressure (!) 88/53, pulse 100, temperature 99 F (37.2 C), temperature source Oral, resp. rate 18, height 5\' 11"  (1.803 m), weight 77.4 kg, SpO2 93%.        General: awake, alert, appropriate, sitting up in bed- ate <25% of meal; NAD HENT: conjugate gaze; oropharynx a little dry CV: regular rate and rhythm; no  JVD Pulmonary: decreased at bases- no wheezes/rales/rhonchi heard GI: soft, NT, ND, (+)BS Psychiatric: appropriate- frustrated with timing getting back from HD Neurological: Ox3 Skin- L chest HD catheter looks OK  PRIOR EXAMS: Skin: + L upper chest HD catheter - dressing c/d/i + Nonpainful erythematous lesions on bilateral feet  MSK:      No apparent deformity.      Strength: Exam limited d/t being on HD; all 4 limbs equal, antigravity and against resistance   Neurologic exam:  Cognition: AAO to person, place, time and event.  + Mild lethargy - intermittently falling asleep during late dialysis; more awake in early exam Language: Fluent, No substitutions or neoglisms. No dysarthria. Names 3/3 objects correctly.  Memory: Recalls 3/3 objects at 5 minutes. No apparent deficits  Insight: Good  insight into current condition.  Mood: Pleasant affect, appropriate mood.  Sensation: To light touch intact in BL UEs and LEs  Reflexes: 1+ in BL UE and LEs. Negative Hoffman's and babinski signs bilaterally.  CN: 2-12 grossly  intact.  Coordination: No apparent tremors. No ataxia on FTN, HTS bilaterally.  Spasticity: MAS 0 in all extremities.       Assessment/Plan: 1. Functional deficits which require 3+ hours per day of interdisciplinary therapy in a comprehensive inpatient rehab setting. Physiatrist is providing close team supervision and 24 hour management of active medical problems listed below. Physiatrist and rehab team continue to assess barriers to discharge/monitor patient progress toward functional and medical goals  Care Tool:  Bathing    Body parts bathed by patient: Right arm, Left arm, Chest, Abdomen, Front perineal area, Buttocks, Right upper leg, Left upper leg, Right lower leg, Left lower leg, Face         Bathing assist Assist Level: Contact Guard/Touching assist     Upper Body Dressing/Undressing Upper body dressing   What is the patient wearing?: Pull over  shirt    Upper body assist Assist Level: Supervision/Verbal cueing    Lower Body Dressing/Undressing Lower body dressing            Lower body assist Assist for lower body dressing: Contact Guard/Touching assist     Toileting Toileting    Toileting assist Assist for toileting: Contact Guard/Touching assist     Transfers Chair/bed transfer  Transfers assist     Chair/bed transfer assist level: Contact Guard/Touching assist     Locomotion Ambulation   Ambulation assist      Assist level: Minimal Assistance - Patient > 75% Assistive device: Walker-rolling Max distance: 170'   Walk 10 feet activity   Assist     Assist level: Minimal Assistance - Patient > 75% Assistive device: Walker-rolling   Walk 50 feet activity   Assist    Assist level: Minimal Assistance - Patient > 75% Assistive device: Walker-rolling    Walk 150 feet activity   Assist    Assist level: Minimal Assistance - Patient > 75% Assistive device: Walker-rolling    Walk 10 feet on uneven surface  activity   Assist Walk 10 feet on uneven surfaces activity did not occur: Safety/medical concerns         Wheelchair     Assist Is the patient using a wheelchair?: Yes Type of Wheelchair: Manual    Wheelchair assist level: Dependent - Patient 0% (pain limits)      Wheelchair 50 feet with 2 turns activity    Assist        Assist Level: Dependent - Patient 0%   Wheelchair 150 feet activity     Assist      Assist Level: Dependent - Patient 0%   Blood pressure (!) 88/53, pulse 100, temperature 99 F (37.2 C), temperature source Oral, resp. rate 18, height 5\' 11"  (1.803 m), weight 77.4 kg, SpO2 93%.  Medical Problem List and Plan: 1. Functional deficits secondary to lumbar osteomyelitis/diskitis d/t MSSA bacteremia.heart vegetations/Endocarditis              -patient may shower             -ELOS/Goals: PT/OT/SLP supervision to mod I, 10-14 days  -d/c  7/19  Con't CIR PT and OT and HD  Limited by cough, and low BP/orthostasis- and HD 2.  Antithrombotics: -DVT/anticoagulation:  Pharmaceutical: Heparin 5000 q8h             -antiplatelet therapy: N/A 3. Low back pain/Pain Management: tylenol, Oxycodone prn. Advised to use oxycodone prior to sleep --will schedule low dose robaxin 250mg  bid as reports ongoing lower back and LE spasms.  -  7/5- will increase Oxy to 10-15 mg q4 hours prn-can add long acting over weekend if necessary -7/6-7/24 pain seems controlled for now, monitor 7/12- pain doing much better at 7.5-10 mg q4 hours prn 4. Mood/Behavior/Sleep: LCSW to follow for evaluation and support.              -antipsychotic agents: N/A --acute on chronic insomnia in part due to anxiety. Will schedule low dose trazodone. Continue prn melatonin -7/5- will change trazodone to 75 mg at bedtime- and can be titrated more over weekend if needed.  7/9- sleeping well  7/10- didn't sleep well- will increase trazodone to 150 mg at bedtime 7/12- slept somewhat better- con't trazodone 5. Neuropsych/cognition: This patient is capable of making decisions on his own behalf. 6. Skin/Wound Care: Routine pressure relief measures -continue to monitor new lesions on bilateral feet for evolution/progress   7. Fluids/Electrolytes/Nutrition: Strict I/O. Daily weights. Labs TTS with HD. Add low salt restrictions             --continue Nephro supplements 8. 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  --IV cefazolin with HD. EOT 11/04/22              --Fevers have resolved. CRP-44/Sed rate 1.5 @ admission.  --Leucocytosis trending down from 23.9-->31.4-->13.9.    -7/5- Pt's WBC down to 13/9- will recheck labs in AM as well as qmonday -09/27/22 WBC 13.2, cont to monitor  7/8- WBC stable at 13k- CRP 17.3 and ESR >140- will call ID- they said ESR  not real helpful in ESRD pt's- so to make sure CRP improving and pt looks good, to call them back before pt leaves.  Called, since ESR rising- last checked 3 weeks ago and WBC won't resolve. 7/9- ID ok with labs since ESR not a good indicator in ESRD- follow labs weekly.   7/12- will call ID before leaves rehab 9. SBO w/ enteritis: Has resolved. Surgery team signed off 10. ESRD: HD TTS at the end of the day to help with tolerance of therapy.              --strict I/O with daily weights. Continues to require 4 L oxygen per [place].  --On Liberalized diet but 1200 cc FR/4 gram salt.  Velphoro for elevated phos level. -7/5- Na down to 128- per Renal  -09/27/22 dialysis today 7/9- K+_ 5.3- this AM and Na 127- per renal to intervene- they will likely check labs and follow up 7/10- K+ up, but then held HD yesterday. Na 126-  7/11- Na 126 and K_ 5.2 still- also d/w pt with renal about midodrine- per renal.  7/12- Na up to 128-K+ 4.0 11. Anemia of chronic disease: On Aranesp weekly every Thursday w/HD.   7/12- Hb 8.5- better than was of 7.8-8.2 12. HTN: Monitor BP TID. On low dose tenormin 12.5mg  every day but BP with drops during HD.  -7/5- BP running low 90s systolic when laying down- I'm concerned will have orthostatic hypotension-will check with PT/OT  -09/27/22 BPs soft, monitor closely -09/28/22 BP a little softer today, hold atenolol this morning, monitor to see if changes need to be made to that. Denies orthostatic symptoms.  7/8- held atenolol- BP dropped to 60-70's systolic yesterday- was 90s SBP this AM- will monitor- midodrine increased yesterday to 2.5 mg 7/9- will hold atenolol since BP 104 systolic and going to HD today.   7/10-  stopped Atenolol and increased Midodrine to 5 mg BID with breakfast/lunch 7/11- increased midodrine to 5 mg TID- but think he might benefit from 10 mg since BP has been running 70s with sitting/standing with therapy.  7/12- BP running 80's this AM- Midodrine up 5-10 mg  TID- depending on HD days- asymptomatic as usual- will d/w therapy again.  Vitals:   10/02/22 1500 10/02/22 1530 10/02/22 1600 10/02/22 1630  BP: 101/74 128/70 120/73 103/70   10/02/22 1700 10/02/22 1730 10/02/22 1736 10/02/22 1745  BP: 130/68 115/70 117/65 123/68   10/02/22 2015 10/02/22 2212 10/03/22 0358 10/03/22 0358  BP: 104/68 (!) 91/55 (!) 88/53 (!) 88/53    13. AHRF d/t cavitary pneumonia. Wean oxygen as able. Currently 2-4L; per patient not using CPAP/BiPAP   -7/5- will wean O2 as allowed  -09/27/22 no O2 on today, monitor 14. Constipation:  -09/28/22 had a small BM yesterday, lots of gas, will do dose of miralax now then add on QD PRN dose, advised to take senna with the miralax dose now, then could use the PRN miralax dose after lunch if still not successful-- if not working, then use suppository +/- enema.  7/8- LBM  7/6- small- feels like needs to go- wants to wait on more intervention.   7/9- Had good sized BM yesterday- feeling better  7/10- bowels "going ok".   7/12- LBM yesterday- had 2 BM's. Feeling better 15. New cough/mild SOB  7/11- got CXR_ concern about fluid overload/heart decline in working? Due to enlarged cardiac silhouette and appears like effusion/possible infiltrate- Called IM consult to address- CXR read pending.   7/12- CXR shows more atelectasis- will order flutter valve and tessalon pearls 100 mg BID for cough. Pt reports still bothersome  16. Severe protein Malnutrition  7/12- Alb down to 1.6- will d/w team about what to add for appetite stimulation- thinking Remeron?will order dietitian consult.     I spent a total of 39   minutes on total care today- >50% coordination of care- due to  D/w pt and nursing about eating- and started Tessalon pearls for cough- will  Address severe malnutrition- order dietitian consult LOS: 8 days A FACE TO FACE EVALUATION WAS PERFORMED  Wilburn Keir 10/03/2022, 10:08 AM

## 2022-10-03 NOTE — Plan of Care (Signed)
  Problem: Consults Goal: RH GENERAL PATIENT EDUCATION Description: See Patient Education module for education specifics. 10/03/2022 0631 by Melvenia Beam, LPN Outcome: Progressing 10/03/2022 0631 by Melvenia Beam, LPN Outcome: Progressing   Problem: RH BOWEL ELIMINATION Goal: RH STG MANAGE BOWEL WITH ASSISTANCE Description: STG Manage Bowel with min Assistance. 10/03/2022 0631 by Melvenia Beam, LPN Outcome: Progressing 10/03/2022 0631 by Melvenia Beam, LPN Outcome: Progressing   Problem: RH BLADDER ELIMINATION Goal: RH STG MANAGE BLADDER WITH ASSISTANCE Description: STG Manage Bladder With min Assistance 10/03/2022 0631 by Melvenia Beam, LPN Outcome: Progressing 10/03/2022 0631 by Melvenia Beam, LPN Outcome: Progressing   Problem: Education: Goal: Knowledge of General Education information will improve Description: Including pain rating scale, medication(s)/side effects and non-pharmacologic comfort measures 10/03/2022 0631 by Melvenia Beam, LPN Outcome: Progressing 10/03/2022 0631 by Melvenia Beam, LPN Outcome: Progressing   Problem: Health Behavior/Discharge Planning: Goal: Ability to manage health-related needs will improve 10/03/2022 0631 by Melvenia Beam, LPN Outcome: Progressing 10/03/2022 0631 by Melvenia Beam, LPN Outcome: Progressing   Problem: Clinical Measurements: Goal: Ability to maintain clinical measurements within normal limits will improve 10/03/2022 0631 by Melvenia Beam, LPN Outcome: Progressing 10/03/2022 0631 by Melvenia Beam, LPN Outcome: Progressing Goal: Will remain free from infection 10/03/2022 0631 by Melvenia Beam, LPN Outcome: Progressing 10/03/2022 0631 by Melvenia Beam, LPN Outcome: Progressing Goal: Diagnostic test results will improve 10/03/2022 0631 by Melvenia Beam, LPN Outcome: Progressing 10/03/2022 0631 by Melvenia Beam, LPN Outcome: Progressing Goal:  Respiratory complications will improve 10/03/2022 0631 by Melvenia Beam, LPN Outcome: Progressing 10/03/2022 0631 by Melvenia Beam, LPN Outcome: Progressing Goal: Cardiovascular complication will be avoided 10/03/2022 0631 by Melvenia Beam, LPN Outcome: Progressing 10/03/2022 0631 by Melvenia Beam, LPN Outcome: Progressing   Problem: Activity: Goal: Risk for activity intolerance will decrease 10/03/2022 0631 by Melvenia Beam, LPN Outcome: Progressing 10/03/2022 0631 by Melvenia Beam, LPN Outcome: Progressing   Problem: Nutrition: Goal: Adequate nutrition will be maintained 10/03/2022 0631 by Melvenia Beam, LPN Outcome: Progressing 10/03/2022 0631 by Melvenia Beam, LPN Outcome: Progressing   Problem: Coping: Goal: Level of anxiety will decrease 10/03/2022 0631 by Melvenia Beam, LPN Outcome: Progressing 10/03/2022 0631 by Melvenia Beam, LPN Outcome: Progressing   Problem: Elimination: Goal: Will not experience complications related to bowel motility 10/03/2022 0631 by Melvenia Beam, LPN Outcome: Progressing 10/03/2022 0631 by Melvenia Beam, LPN Outcome: Progressing Goal: Will not experience complications related to urinary retention 10/03/2022 0631 by Melvenia Beam, LPN Outcome: Progressing 10/03/2022 0631 by Melvenia Beam, LPN Outcome: Progressing   Problem: Pain Managment: Goal: General experience of comfort will improve 10/03/2022 0631 by Melvenia Beam, LPN Outcome: Progressing 10/03/2022 0631 by Melvenia Beam, LPN Outcome: Progressing   Problem: Safety: Goal: Ability to remain free from injury will improve 10/03/2022 0631 by Melvenia Beam, LPN Outcome: Progressing 10/03/2022 0631 by Melvenia Beam, LPN Outcome: Progressing   Problem: Skin Integrity: Goal: Risk for impaired skin integrity will decrease 10/03/2022 0631 by Melvenia Beam, LPN Outcome: Progressing 10/03/2022 0631 by  Melvenia Beam, LPN Outcome: Progressing

## 2022-10-03 NOTE — Progress Notes (Signed)
Physical Therapy Session Note  Patient Details  Name: Ryan [name] MRN: [id number] Date of Birth: 05-06-1960  Today's Date: 10/03/2022 PT Individual Time: 1120-1210 PT Individual Time Calculation (min): 50 min   Short Term Goals: Week 1:  PT Short Term Goal 1 (Week 1): Pt will complete sit<>stand transfer with CGA and LRAD PT Short Term Goal 2 (Week 1): Pt will ambulate 200' with LRAD CGA PT Short Term Goal 3 (Week 1): Pt will complete bed mobility CGA without hospital bed features PT Short Term Goal 4 (Week 1): Pt will complete up/down 1 curb step with RW CGA  Skilled Therapeutic Interventions/Progress Updates: Patient in [facility] on entrance to room. Patient alert and agreeable to PT session. Pt had IV in L UE, and hot pad on low back. Pt reported that shortly prior to PT arrival, pt had an incident that led to current low back pain. Attending NT was adjusting pt into robe (pt in WC at that time) and when they went back to sit down, the [facility] was unlocked and it caused pt to hyperextend at the hips. Pt reported 4/10 pain (low back) when not moving, but 7/10 when moving. Therapy session was going to focus on low distance ambulation and gentle therex if appropriate to decrease pain. When pt stood from [facility] to rollator (CGA for safety with VC to push off WC with either UE, and other on rollator), pt reported increase in dizziness (orthostatics checked in sitting and standing - see vitals - but subsided shortly after standing). Pt ambulated a few steps and reported numbness and tingling in B LE's, and felt less confident than usual with ambulating. Pt sat back into WC and further PT deferred 2/2 pt safety and reports of 10/10 pain when trying to sit to [facility] (mod/maxA to control descent for safety). PTA alerted nursing that pt is requesting pain medication for low back and to come to patient's room for further assessment. Pt performed squat pivot back to bed with modA (VC for patient to push off WC with L UE,  and to pull HOB rail with R UE. Pt positioned in reclined position with knees bent (bed adjusted) to decrease pt pain. Attending RN arrived and was made aware of the events that led to pt's current status in low back pain (pt reported 7/10 pain when in bed, and declined feelings of numbness and tingling in bed). RN provided pain medication, and PTA provided ice pack to apply to pt's low back.   Patient reclined in bed at end of session with brakes locked, bed alarm set, RN present and all needs within reach.      Therapy Documentation Precautions:  Precautions Precautions: Fall Restrictions Weight Bearing Restrictions: No  Vitals:  80/53 (62): HR 97 sitting WC  106/60 (75): HR 102 standing 100/55 (66): sitting back in WC  Therapy/Group: Individual Therapy  Sumayah Bearse PTA 10/03/2022, 12:29 PM

## 2022-10-03 NOTE — Progress Notes (Signed)
Speech Language Pathology Daily Session Note  Patient Details  Name: Ryan [name] MRN: [id number] Date of Birth: Jan 02, 1961  Today's Date: 10/03/2022 SLP Individual Time: 1417-1500 SLP Individual Time Calculation (min): 43 min  Short Term Goals: Week 2: SLP Short Term Goal 1 (Week 2): STGs = LTGs 2* ELOS  Skilled Therapeutic Interventions: Skilled treatment session focused on cognitive goals. Upon arrival, patient requested to donn shorts vs pants. Patient's nurse had just removed his ace wraps with SLP providing education regarding donning again prior to standing, etc for task. Patient verbalized understanding and requested to donn pants while supine in bed. SLP assisted with patient threading his pants but patient able to roll side to side to pull them up. SLP facilitated session by providing overall supervision level verbal cues for problem solving and recall during a complex medication management task in which patient had to identify medication administration errors with a TID pill box. Patient maintained appropriate alertness and attention to task. Patient left upright in bed with alarm on and all needs within reach. Continue with current plan of care.      Pain No/Denies Pain   Therapy/Group: Individual Therapy  Adreanna Fickel 10/03/2022, 3:17 PM

## 2022-10-03 NOTE — Progress Notes (Signed)
Pt complaining of SOB. Nurse checked o2 Sat ranging from 87-89. 2L of 02 donned to patient. Nurse unhooked patient IV as order showed to stop at 11 AM and was still going. Nurse repositioned patient with HOB at 35 degrees. Pt stated that he never received flutter valve. Nurse found flutter valve in room behind other items, nurse set up suction for excess secretions and taught patient how to use yanker and flutter valve, explaining how the flutter valve helps. Pt expressed understanding. Nurse rechecked o2 SATS and is 95%. Pt expressed he is comfortable but has no want for food this evening. Nurse offered pt glucerna as well as ice pop to encourage nurishment, pt accepted. All other needs met at this time. Call light in reach.

## 2022-10-03 NOTE — Plan of Care (Signed)
  Problem: Consults Goal: RH GENERAL PATIENT EDUCATION Description: See Patient Education module for education specifics. Outcome: Progressing   Problem: RH BOWEL ELIMINATION Goal: RH STG MANAGE BOWEL WITH ASSISTANCE Description: STG Manage Bowel with min Assistance. Outcome: Progressing   Problem: RH BLADDER ELIMINATION Goal: RH STG MANAGE BLADDER WITH ASSISTANCE Description: STG Manage Bladder With min Assistance Outcome: Progressing   Problem: Education: Goal: Knowledge of General Education information will improve Description: Including pain rating scale, medication(s)/side effects and non-pharmacologic comfort measures Outcome: Progressing   Problem: Health Behavior/Discharge Planning: Goal: Ability to manage health-related needs will improve Outcome: Progressing   Problem: Clinical Measurements: Goal: Ability to maintain clinical measurements within normal limits will improve Outcome: Progressing Goal: Will remain free from infection Outcome: Progressing Goal: Diagnostic test results will improve Outcome: Progressing Goal: Respiratory complications will improve Outcome: Progressing Goal: Cardiovascular complication will be avoided Outcome: Progressing   Problem: Activity: Goal: Risk for activity intolerance will decrease Outcome: Progressing   Problem: Nutrition: Goal: Adequate nutrition will be maintained Outcome: Progressing   Problem: Coping: Goal: Level of anxiety will decrease Outcome: Progressing   Problem: Elimination: Goal: Will not experience complications related to bowel motility Outcome: Progressing Goal: Will not experience complications related to urinary retention Outcome: Progressing   Problem: Pain Managment: Goal: General experience of comfort will improve Outcome: Progressing   Problem: Safety: Goal: Ability to remain free from injury will improve Outcome: Progressing   Problem: Skin Integrity: Goal: Risk for impaired skin  integrity will decrease Outcome: Progressing   

## 2022-10-03 NOTE — Progress Notes (Signed)
Pharmacy Antibiotic Note  Ryan [name] is a 62 y.o. male with ESRD on HD via [facility] admitted on 09/25/2022 with  MSSA bacteremia with native mitral valve endocarditis, L5-S1 discitis/osteomyelitis, and possible pulmonary septic emboli s/p 72h line holiday .  Pharmacy has been consulted for cefazolin dosing.  Plan is for cefazolin for 8 weeks (thru 8/13). WBC down to 12.9 on 7/12. - last dose 7/11 after HD.  Plan: Per MD, cefazolin 2 g IV after HD on Tues-Thurs and cefazolin 3g IV after HD on Saturday  F/U HD schedule and that doses are charted as given Pharmacy will continue to follow along  Height: 5\' 11"  (180.3 cm) Weight: 77.4 kg (170 lb 10.2 oz) IBW/kg (Calculated) : 75.3  Temp (24hrs), Avg:99 F (37.2 C), Min:98.7 F (37.1 C), Max:99.6 F (37.6 C)  Recent Labs  Lab 09/27/22 0548 09/29/22 0656 09/30/22 1142 10/02/22 0735 10/02/22 1234 10/03/22 0629  WBC 13.2* 13.1* 12.9*  --  13.3*  --   CREATININE 8.46* 7.87* 10.14* 8.75* 9.09* 5.67*    ESRD - TTS HD  Allergies  Allergen Reactions   Iodinated Contrast Media Itching and Nausea Only    Probably needs prednisone prep prior to contrast   Z-Pak [Azithromycin] Nausea And Vomiting and Other (See Comments)    Chest tightness GI Intolerance    Antimicrobials this admission: Vancomcyin x 1 on 6/16 Cefepime x 1 on 6/16 Metronidazole x 1 on 6/16 Cefazolin 6/16 >> (8/13)  Microbiology results: 6/16 blood: 4/4 MSSA  6/16 resp panel: neg 6/16 COVID: neg 6/17 blood: neg 6/19 blood: neg 6/20 MRSA PCR: neg   Thank you for involving pharmacy in this patient's care.  Dennie Fetters, RPh 10/03/2022 11:48 AM

## 2022-10-04 DIAGNOSIS — R000 Tachycardia, unspecified: Secondary | ICD-10-CM | POA: Diagnosis not present

## 2022-10-04 DIAGNOSIS — I959 Hypotension, unspecified: Secondary | ICD-10-CM | POA: Diagnosis not present

## 2022-10-04 DIAGNOSIS — N186 End stage renal disease: Secondary | ICD-10-CM | POA: Diagnosis not present

## 2022-10-04 DIAGNOSIS — Z992 Dependence on renal dialysis: Secondary | ICD-10-CM | POA: Diagnosis not present

## 2022-10-04 DIAGNOSIS — M4626 Osteomyelitis of vertebra, lumbar region: Secondary | ICD-10-CM | POA: Diagnosis not present

## 2022-10-04 DIAGNOSIS — E871 Hypo-osmolality and hyponatremia: Secondary | ICD-10-CM | POA: Diagnosis not present

## 2022-10-04 LAB — RENAL FUNCTION PANEL
Albumin: 1.7 g/dL — ABNORMAL LOW (ref 3.5–5.0)
Anion gap: 11 (ref 5–15)
BUN: 39 mg/dL — ABNORMAL HIGH (ref 8–23)
CO2: 24 mmol/L (ref 22–32)
Calcium: 8.1 mg/dL — ABNORMAL LOW (ref 8.9–10.3)
Chloride: 93 mmol/L — ABNORMAL LOW (ref 98–111)
Creatinine, Ser: 7.44 mg/dL — ABNORMAL HIGH (ref 0.61–1.24)
GFR, Estimated: 8 mL/min — ABNORMAL LOW (ref >60)
Glucose, Bld: 107 mg/dL — ABNORMAL HIGH (ref 70–99)
Phosphorus: 3.8 mg/dL (ref 2.5–4.6)
Potassium: 4.1 mmol/L (ref 3.5–5.1)
Sodium: 128 mmol/L — ABNORMAL LOW (ref 135–145)

## 2022-10-04 MED ORDER — HEPARIN SODIUM (PORCINE) 1000 UNIT/ML IJ SOLN
INTRAMUSCULAR | Status: AC
  Administered 2022-10-04: 1000 [IU]
  Filled 2022-10-04: qty 4

## 2022-10-04 MED ORDER — MIDODRINE HCL 5 MG PO TABS
ORAL_TABLET | ORAL | Status: AC
  Administered 2022-10-04: 10 mg via ORAL
  Filled 2022-10-04: qty 2

## 2022-10-04 MED ORDER — HEPARIN SODIUM (PORCINE) 1000 UNIT/ML IJ SOLN
INTRAMUSCULAR | Status: AC
  Filled 2022-10-04: qty 4

## 2022-10-04 NOTE — Progress Notes (Addendum)
PROGRESS NOTE   Subjective/Complaints:  Pt doing alright, had some difficulty breathing last night but seems to be better today. On Oxygen this morning but comfortable. Slept well, pain well controlled, LBM last night. Anuric, going for dialysis today.     ROS:   Pt denies SOB, abd pain, CP, N/V/C/D, and vision changes   Except for HPI  Objective:   No results found. Recent Labs    10/02/22 1234  WBC 13.3*  HGB 8.5*  HCT 26.8*  PLT 352   Recent Labs    10/03/22 0629 10/04/22 0553  NA 128* 128*  K 4.0 4.1  CL 92* 93*  CO2 23 24  GLUCOSE 87 107*  BUN 27* 39*  CREATININE 5.67* 7.44*  CALCIUM 7.8* 8.1*    Intake/Output Summary (Last 24 hours) at 10/04/2022 1210 Last data filed at 10/04/2022 4782 Gross per 24 hour  Intake 876.28 ml  Output --  Net 876.28 ml         Physical Exam: Vital Signs Blood pressure 98/69, pulse 83, temperature 98.7 F (37.1 C), temperature source Oral, resp. rate 18, height 5\' 11"  (1.803 m), weight 77.3 kg, SpO2 98%.  General: awake, alert, appropriate, sitting up in bed, NAD HENT: conjugate gaze; oropharynx a tad dry CV: regular rate and rhythm; no JVD Pulmonary: slightly decreased at bases- no wheezes/rales/rhonchi heard, no increased WOB, using O2 via [place] GI: soft, NT, ND, (+)BS Psychiatric: appropriate Neurological: Ox3  PRIOR EXAMS: Skin: + L upper chest HD catheter - dressing c/d/i + Nonpainful erythematous lesions on bilateral feet  MSK:      No apparent deformity.      Strength: Exam limited d/t being on HD; all 4 limbs equal, antigravity and against resistance   Neurologic exam:  Cognition: AAO to person, place, time and event.  + Mild lethargy - intermittently falling asleep during late dialysis; more awake in early exam Language: Fluent, No substitutions or neoglisms. No dysarthria. Names 3/3 objects correctly.  Memory: Recalls 3/3 objects at 5 minutes.  No apparent deficits  Insight: Good  insight into current condition.  Mood: Pleasant affect, appropriate mood.  Sensation: To light touch intact in BL UEs and LEs  Reflexes: 1+ in BL UE and LEs. Negative Hoffman's and babinski signs bilaterally.  CN: 2-12 grossly intact.  Coordination: No apparent tremors. No ataxia on FTN, HTS bilaterally.  Spasticity: MAS 0 in all extremities.       Assessment/Plan: 1. Functional deficits which require 3+ hours per day of interdisciplinary therapy in a comprehensive inpatient rehab setting. Physiatrist is providing close team supervision and 24 hour management of active medical problems listed below. Physiatrist and rehab team continue to assess barriers to discharge/monitor patient progress toward functional and medical goals  Care Tool:  Bathing    Body parts bathed by patient: Right arm, Left arm, Chest, Abdomen, Front perineal area, Buttocks, Right upper leg, Left upper leg, Right lower leg, Left lower leg, Face         Bathing assist Assist Level: Supervision/Verbal cueing     Upper Body Dressing/Undressing Upper body dressing   What is the patient wearing?: Pull over shirt  Upper body assist Assist Level: Supervision/Verbal cueing    Lower Body Dressing/Undressing Lower body dressing      What is the patient wearing?: Underwear/pull up     Lower body assist Assist for lower body dressing: Supervision/Verbal cueing     Toileting Toileting    Toileting assist Assist for toileting: Supervision/Verbal cueing     Transfers Chair/bed transfer  Transfers assist     Chair/bed transfer assist level: Supervision/Verbal cueing     Locomotion Ambulation   Ambulation assist      Assist level: Minimal Assistance - Patient > 75% Assistive device: Rollator Max distance: 170'   Walk 10 feet activity   Assist     Assist level: Minimal Assistance - Patient > 75% Assistive device: Walker-rolling   Walk 50 feet  activity   Assist    Assist level: Minimal Assistance - Patient > 75% Assistive device: Walker-rolling    Walk 150 feet activity   Assist    Assist level: Minimal Assistance - Patient > 75% Assistive device: Walker-rolling    Walk 10 feet on uneven surface  activity   Assist Walk 10 feet on uneven surfaces activity did not occur: Safety/medical concerns         Wheelchair     Assist Is the patient using a wheelchair?: Yes Type of Wheelchair: Manual    Wheelchair assist level: Dependent - Patient 0% (pain limits)      Wheelchair 50 feet with 2 turns activity    Assist        Assist Level: Dependent - Patient 0%   Wheelchair 150 feet activity     Assist      Assist Level: Dependent - Patient 0%   Blood pressure 98/69, pulse 83, temperature 98.7 F (37.1 C), temperature source Oral, resp. rate 18, height 5\' 11"  (1.803 m), weight 77.3 kg, SpO2 98%.  Medical Problem List and Plan: 1. 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  Antithrombotics: -DVT/anticoagulation:  Pharmaceutical: Heparin 5000 q8h             -antiplatelet therapy: N/A 3. Low back pain/Pain Management: tylenol, Oxycodone prn. Advised to use oxycodone prior to sleep --will schedule low dose robaxin 250mg  bid as reports ongoing lower back and LE spasms.  -7/5- will increase Oxy to 10-15 mg q4 hours prn-can add long acting over weekend if necessary -7/6-7/24 pain seems controlled for now, monitor 7/12- pain doing much better at 7.5-10 mg q4 hours prn 4. Mood/Behavior/Sleep: LCSW to follow for evaluation and support.              -antipsychotic agents: N/A --acute on chronic insomnia in part due to anxiety. Will  schedule low dose trazodone. Continue prn melatonin -7/5- will change trazodone to 75 mg at bedtime- and can be titrated more over weekend if needed.  7/9- sleeping well  7/10- didn't sleep well- will increase trazodone to 150 mg at bedtime 7/12- slept somewhat better- con't trazodone 5. Neuropsych/cognition: This patient is capable of making decisions on his own behalf. 6. Skin/Wound Care: Routine pressure relief measures -continue to monitor new lesions on bilateral feet for evolution/progress  7. Fluids/Electrolytes/Nutrition: Strict I/O. Daily weights. Labs TTS with HD. Add low salt restrictions             --continue Nephro supplements 8. 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  --IV cefazolin with HD. EOT 11/04/22              --Fevers have resolved. CRP-44/Sed rate 1.5 @ admission.  --Leucocytosis trending down from 23.9-->31.4-->13.9.    -7/5- Pt's WBC down to 13/9- will recheck labs in AM as well as qmonday -09/27/22 WBC 13.2, cont to monitor 7/8- WBC stable at 13k- CRP 17.3 and ESR >140- will call ID- they said ESR not real helpful in ESRD pt's- so to make sure CRP improving and pt looks good, to call them back before pt leaves.  Called, since ESR rising- last checked 3 weeks ago and WBC won't resolve. 7/9- ID ok with labs since ESR not a good indicator in ESRD- follow labs weekly.   7/12- will call ID before leaves rehab 9. SBO w/ enteritis: Has resolved. Surgery team signed off 10. ESRD: HD TTS at the end of the day to help with tolerance of therapy.              --strict I/O with daily weights. Continues to require 4 L oxygen per [place].  --On Liberalized diet but 1200 cc FR/4 gram salt.  Velphoro for elevated phos level. -7/5- Na down to 128- per Renal  -09/27/22 dialysis today 7/9- K+_ 5.3- this AM and Na 127- per renal to intervene- they will likely check  labs and follow up 7/10- K+ up, but then held HD yesterday. Na 126-  7/11- Na 126 and K_ 5.2 still- also d/w pt with renal about midodrine- per renal.  7/12- Na up to 128-K+ 4.0 11. Anemia of chronic disease: On Aranesp weekly every Thursday w/HD.   7/12- Hb 8.5- better than was of 7.8-8.2 12. HTN: Monitor BP TID. On low dose tenormin 12.5mg  every day but BP with drops during HD.  -7/5- BP running low 90s systolic when laying down- I'm concerned will have orthostatic hypotension-will check with PT/OT  -09/27/22 BPs soft, monitor closely -09/28/22 BP a little softer today, hold atenolol this morning, monitor to see if changes need to be made to that. Denies orthostatic symptoms.  7/8- held atenolol- BP dropped to 60-70's systolic yesterday- was 90s SBP this AM- will monitor- midodrine increased yesterday to 2.5 mg 7/9- will hold atenolol since BP 104 systolic and going to HD today.   7/10- stopped Atenolol and increased Midodrine to 5 mg BID with breakfast/lunch 7/11- increased midodrine to 5 mg TID- but think he might benefit from 10 mg since BP has been running 70s with sitting/standing with therapy.  7/12- BP running 80's this AM- Midodrine up 5-10 mg TID- depending on HD days- asymptomatic as usual- will d/w therapy again.  -10/04/22 BPs doing well.  Vitals:   10/02/22 1600 10/02/22 1630 10/02/22 1700 10/02/22 1730  BP: 120/73 103/70 130/68 115/70   10/02/22 1736 10/02/22 1745 10/02/22 2015 10/02/22 2212  BP: 117/65 123/68 104/68 (!) 91/55   10/03/22 0358 10/03/22 0358 10/03/22 2050 10/04/22 0445  BP: (!) 88/53 (!) 88/53 131/73 98/69    13. AHRF d/t cavitary pneumonia. Wean oxygen as able. Currently 2-4L; per patient not using CPAP/BiPAP   -7/5- will wean O2 as allowed  -09/27/22 no O2 on today, monitor  -  10/04/22 needing O2 last night, doing well today, monitor 14. Constipation:  -09/28/22 had a small BM yesterday, lots of gas, will do dose of miralax now then add on QD PRN dose, advised to  take senna with the miralax dose now, then could use the PRN miralax dose after lunch if still not successful-- if not working, then use suppository +/- enema.  7/8- LBM  7/6- small- feels like needs to go- wants to wait on more intervention.   7/9- Had good sized BM yesterday- feeling better  7/10- bowels "going ok".   -10/04/22 LBM last night, doing well, monitor 15. New cough/mild SOB  7/11- got CXR_ concern about fluid overload/heart decline in working? Due to enlarged cardiac silhouette and appears like effusion/possible infiltrate- Called IM consult to address- CXR read pending.   7/12- CXR shows more atelectasis- will order flutter valve and tessalon pearls 100 mg BID for cough. Pt reports still bothersome   -10/04/22 cough better, needing O2 today but feels ok; hopefully dialysis today will help with volume; monitor 16. Severe protein Malnutrition  7/12- Alb down to 1.6- will d/w team about what to add for appetite stimulation- thinking Remeron?will order dietitian consult.      LOS: 9 days A FACE TO FACE EVALUATION WAS PERFORMED  [address] 10/04/2022, 12:10 PM

## 2022-10-04 NOTE — Progress Notes (Signed)
Patient to dialysis at this time.

## 2022-10-04 NOTE — Progress Notes (Signed)
   10/04/22 1852  Vitals  Temp 97.6 F (36.4 C)  Temp Source Oral  BP 120/76  BP Location Right Arm  BP Method Automatic  Patient Position (if appropriate) Lying  Pulse Rate (!) 124  Resp 20  Oxygen Therapy  SpO2 100 %  O2 Device Nasal Cannula  O2 Flow Rate (L/min) 2 L/min  During Treatment Monitoring  Intra-Hemodialysis Comments Tx completed  Post Treatment  Dialyzer Clearance Lightly streaked  Duration of HD Treatment -hour(s) 3 hour(s)  Hemodialysis Intake (mL) 0 mL  Liters Processed 70  Fluid Removed (mL) 0 mL  Tolerated HD Treatment No (Comment)  Post-Hemodialysis Comments Pt cut off 45 mins d/t tired. Pt had AFib during tx. Dr. Arlean Hopping notified. no order received.  Hemodialysis Catheter Left Subclavian Double lumen Permanent (Tunneled)  Placement Date/Time: 09/22/22 1526   Serial / Lot #: [id number]  Expiration Date: 05/22/27  Time Out: Correct site;Correct patient;Correct procedure  Maximum sterile barrier precautions: Hand hygiene;Cap;Mask;Sterile gown;Sterile gloves;Large sterile sh...  Site Condition No complications  Blue Lumen Status Heparin locked  Red Lumen Status Heparin locked  Purple Lumen Status N/A  Catheter fill solution Heparin 1000 units/ml  Catheter fill volume (Arterial) 1.9 cc  Catheter fill volume (Venous) 1.9  Dressing Type Transparent  Dressing Status Antimicrobial disc in place  Interventions New dressing  Drainage Description None  Post treatment catheter status Capped and Clamped

## 2022-10-04 NOTE — Progress Notes (Signed)
Subjective:  sitting up in wheelchair  with son in room , no cos , next hd today on schedule   Objective Vital signs in last 24 hours: Vitals:   10/03/22 2000 10/03/22 2050 10/04/22 0445 10/04/22 0515  BP:  131/73 98/69   Pulse:  (!) 103 83   Resp:  18 18   Temp:  99.2 F (37.3 C) 98.7 F (37.1 C)   TempSrc:  Oral Oral   SpO2: (!) 87% 93% 98%   Weight:    77.3 kg  Height:       Weight change: -0.1 kg  Physical Exam: General: alert pleasant  sitting in wheelchair  Heart: RRR, no MRG Lungs: CTA   Abdomen: NABS , Soft, nt, nd Extremities:bilat compression hose  , no appreciated  edema   Dialysis Access:  L internal jugular TDC dressing d/c/I     OP Dialysis Orders: NW TTS  3:45 450/A1.5x 2K/2Ca EDW 85.7kg TDC  -Heparin 3000 units IV TIW  -No ESA -Calcitriol 1.5 MCG PO TIW   Problem/Plan: 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 ID RX  Blood Cx 6/16 MSSA, negative Cx on 6/17, 6/19. TDC removed 6/18. Completed 72 hour line holiday. TTE concerning for endocarditis. TEE cancelled d/t high risk factors. LS MRI with possible L5-S1 discitis. Temp cath placed 6/21, but poor function requiring exchange -  Will be getting IV Cefazolin 2g q HD x 6 weeks (until 11/04/22). Deconditioning /weakness = in CIR Dialysis Access: RIJ TDC removed 6/18 - s/p line holiday, then temp line placed 6/21 with recurrent non-function issues despite exchange 6/26. S/p [facility] 7/1 in IR.  SBO - on CT Ab 6/19. Now resolved, surgery team has signed off. Hyperkalemia: K+ 4.1 today. He was on regular diet. Changed to renal diet. Use 2.0 K bath. Follow K+ levels.  Hyponatremia: NA 128. No evidence of volume overload with hypotension present. He is under OP EDW. On NS infusion for 24 hours. Run even with HD 10/04/2022. Follow labs.  ESRD: Usual TTS schedule - then line issues preventing HD 6/27 -7/1. Now resolved and HD 7/1, 7/2 to correct. Tolerated HD Th w/ 3.5L net UF and 3L  on 7/6. Appears to be euvolemic today; EDW is likely ~75.2-76kg.   Next HD today 10/04/2022 [facility] Access: S/p L AVF ligation in 2019. Last seen by Dr. Edilia Bo 01/2022 - limited mobility in R arm so planning for L arm graft but had not scheduled surgery yet.   Await infxn  rx before new access / dw pt today he agrees  to get arm access now, had refused in past   Hypotension/volume: BP better, Not overtly volume overloaded by exam however sodium is falling. BP soft, not on antihypertensive meds. Started NS, given 1 liter with HD 10/02/2022 with additional IVF overnight. Continued for another 24 hours.  Bp 98/69 today Midodrine dose Change to 10 mg PO TID.  UF as tolerated. Will need lower EDW on discharge. 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 have increased next dose. Metabolic bone disease: CorrCa ok, Phos initially 7.5. > 3.8 Binder changed to Velphoro now at goal. Increased dose to 2 tabs PO TID AC.  Had ??? Op compliance  with binder  Nutrition - Renal diet, Alb low - continue supplements  Lenny Pastel, PA-C [facility] [phone] 10/04/2022,1:51 PM  LOS: 9 days   Labs: Basic Metabolic Panel: Recent Labs  Lab 10/02/22 1234 10/03/22 0629 10/04/22 4540  NA 128* 128* 128*  K 5.3* 4.0 4.1  CL 85* 92* 93*  CO2 24 23 24   GLUCOSE 109* 87 107*  BUN 61* 27* 39*  CREATININE 9.09* 5.67* 7.44*  CALCIUM 8.9 7.8* 8.1*  PHOS 4.5 3.8 3.8   Liver Function Tests: Recent Labs  Lab 10/02/22 1234 10/03/22 0629 10/04/22 0553  ALBUMIN 2.1* 1.6* 1.7*   No results for input(s): "LIPASE", "AMYLASE" in the last 168 hours. No results for input(s): "AMMONIA" in the last 168 hours. CBC: Recent Labs  Lab 09/29/22 0656 09/30/22 1142 10/02/22 1234  WBC 13.1* 12.9* 13.3*  NEUTROABS 10.7* 10.6* 11.0*  HGB 7.8* 8.2* 8.5*  HCT 25.0* 26.1* 26.8*  MCV 83.3 82.3 84.0  PLT 267 283 352   Cardiac Enzymes: No results for input(s): "CKTOTAL", "CKMB", "CKMBINDEX",  "TROPONINI" in the last 168 hours. CBG: Recent Labs  Lab 09/29/22 0602  GLUCAP 102*    Studies/Results: No results found. Medications:   ceFAZolin (ANCEF) IV 2 g (10/02/22 1909)    ceFAZolin (ANCEF) IV 3 g (09/28/22 0123)    (feeding supplement) PROSource Plus  30 mL Oral TID BM   benzonatate  100 mg Oral BID   calcitRIOL  0.5 mcg Oral Q T,Th,Sat-1800   Chlorhexidine Gluconate Cloth  6 each Topical Q12H   cinacalcet  30 mg Oral Q supper   darbepoetin (ARANESP) injection - DIALYSIS  100 mcg Subcutaneous Q Thu-1800   feeding supplement (NEPRO CARB STEADY)  237 mL Oral TID BM   fluticasone  1 spray Each Nare Daily   guaiFENesin  600 mg Oral BID   heparin  5,000 Units Subcutaneous Q8H   loratadine  10 mg Oral Daily   methocarbamol  250 mg Oral BID   midodrine  10 mg Oral TID WC   multivitamin  1 tablet Oral QHS   pantoprazole  40 mg Oral Daily   sucroferric oxyhydroxide  1,000 mg Oral TID WC   traZODone  150 mg Oral QHS

## 2022-10-04 NOTE — Progress Notes (Signed)
Occupational Therapy Session Note  Patient Details  Name: Ryan [name] MRN: [id number] Date of Birth: September 26, 1960  Today's Date: 10/04/2022 OT Individual Time: 5621-3086 OT Individual Time Calculation (min): 46 min    Short Term Goals: Week 1:  OT Short Term Goal 1 (Week 1): Pt will complete bathing at CGA in shower OT Short Term Goal 1 - Progress (Week 1): Met OT Short Term Goal 2 (Week 1): Pt will complete AE training to demonstrate LB dressing CGA OT Short Term Goal 2 - Progress (Week 1): Met OT Short Term Goal 3 (Week 1): Pt will complete standing activity with tolerance >5 minutes OT Short Term Goal 3 - Progress (Week 1): Met  Skilled Therapeutic Interventions/Progress Updates:    OT session focused on ADL retraining, functional endurance, and sit<>stand. Pt received supine in bed asking to wash up on EOB. Educated on bed mobility techniques to minimize back pain. Completed sponge bath sit<>stand at EOB with min A balance and mod A for managing clothing items around feet. Following dressing, pt asked to lie down due to fatigue/pain. OT assisted with BLEs then pt left in bed with all needs in reach and bed alarm on.   Therapy Documentation Precautions:  Precautions Precautions: Fall Restrictions Weight Bearing Restrictions: No General:   Vital Signs:   Pain:   ADL: ADL Eating: Set up Where Assessed-Eating: Bed level Grooming: Setup Where Assessed-Grooming: Sitting at sink Upper Body Bathing: Minimal assistance Where Assessed-Upper Body Bathing: Sitting at sink Lower Body Bathing: Moderate assistance Where Assessed-Lower Body Bathing: Standing at sink, Sitting at sink Upper Body Dressing: Minimal assistance Where Assessed-Upper Body Dressing: Sitting at sink Lower Body Dressing: Moderate assistance Where Assessed-Lower Body Dressing: Sitting at sink, Standing at sink ADL Comments: would benefit from AE training Vision   Perception    Praxis   Balance    Exercises:   Other Treatments:     Therapy/Group: Individual Therapy  Daneil Dan 10/04/2022, 12:07 PM

## 2022-10-04 NOTE — Progress Notes (Signed)
PROGRESS NOTE    Ryan [name]  ZOX:[id number] DOB: 27-Jan-1961 DOA: 09/25/2022 PCP: Sharin Grave, MD     Brief Narrative:  Ryan [name] is a 62 y.o. male with past medical history of hypertension, hyperlipidemia, and ESRD on hemodialysis who initially presented to the hospital on 6/6 with complaints of low back pain ultimately found to have evidence of L5/S1 discitis and osteomyelitis as well as MSSA bacteremia with mitral valve endocarditis.  CT scan of the chest noted septic emboli with new airspace opacities in the left upper lobe . MRI brain noting scattered subcentimeter infarcts involving the bilateral cerebral hemisphere and right cerebellum.  CT surgery has been consulted but recommended conservative management as.  He was already responding to antibiotics.  Plan was for patient to continue on antibiotics at cefazolin until 8/13 per ID recommendations.  Hospital course had also been complicated by small bowel obstruction which has since resolved.  Patient had been discharged to inpatient rehab on 7/4.  Patient reports that he has had an intermittent cough that is mostly nonproductive that started during his hospitalization.  Reports cough seems to worsen with talking.  He has intermittently gotten choked up with certain foods.  His last bowel movement was approximately 2 days ago.  Chest x-ray noted poor inspiratory effort with no acute abnormality noted.  Labs from today note WBC 13.3, hemoglobin 8.5, sodium 126, potassium 5.2, BUN 61, creatinine 8.75, and BNP 355.   New events last 24 hours / Subjective: Overnight, became short of breath and needed oxygen.  His IV fluid was discontinued.  This morning, patient is feeling better, cough seems to have improved.  Assessment & Plan:   Principal Problem:   Acute osteomyelitis of lumbar spine (HCC) Active Problems:   Leukocytosis   Cough   ESRD on dialysis (HCC)   Hyperkalemia   Diskitis   Persistent dry cough -Could be in  setting of postnasal drainage, septic emboli, volume overload, atelectasis -Chest x-ray showed poor expiratory effort, no acute abnormality or focal infiltrate seen -No signs of pneumonia at this time or aspiration risk -Continue incentive strategy, flutter valve -Mucinex, Tessalon Perles -Claritin and Flonase -Volume management with dialysis -I think that overnight events may have been exacerbated by IV fluid, which was given due to low blood pressure.  Patient is due for dialysis later today.  Cough is improved  Other medical issues including: L5/S1 discitis and osteomyelitis, MSSA bacteremia with mitral valve endocarditis, septic emboli to the lungs, septic emboli to the brain Persistent leukocytosis in setting of infection as above ESRD on dialysis Anemia of chronic disease Small bowel obstruction, resolved -Patient remains on cefazolin until 8/13 -Dialysis per nephrology -Continue rehab efforts   TRH will check back to see patient tomorrow   Antimicrobials:  Anti-infectives (From admission, onward)    Start     Dose/Rate Route Frequency Ordered Stop   09/30/22 1800  ceFAZolin (ANCEF) IVPB 2g/100 mL premix       Note to Pharmacy: can we give 3gm of ancef tomorrow instead of 2gm? (Only for satursdays)   2 g 200 mL/hr over 30 Minutes Intravenous Once per day on Tue Thu 09/26/22 1107 11/04/22 2359   09/27/22 1800  ceFAZolin (ANCEF) IVPB 2g/100 mL premix  Status:  Discontinued       Note to Pharmacy: can we give 3gm of ancef tomorrow instead of 2gm? (Only for satursdays)   2 g 200 mL/hr over 30 Minutes Intravenous Every T-Th-Sa (1800) 09/26/22 0809 09/26/22  1107   09/27/22 1800  ceFAZolin (ANCEF) IVPB 3g/100 mL premix        3 g 200 mL/hr over 30 Minutes Intravenous Once per day on Sat 09/26/22 1107 11/01/22 2359   09/25/22 1800  ceFAZolin (ANCEF) IVPB 2g/100 mL premix  Status:  Discontinued        2 g 200 mL/hr over 30 Minutes Intravenous Every T-Th-Sa (1800) 09/25/22 1647  09/26/22 0809        Objective: Vitals:   10/03/22 2000 10/03/22 2050 10/04/22 0445 10/04/22 0515  BP:  131/73 98/69   Pulse:  (!) 103 83   Resp:  18 18   Temp:  99.2 F (37.3 C) 98.7 F (37.1 C)   TempSrc:  Oral Oral   SpO2: (!) 87% 93% 98%   Weight:    77.3 kg  Height:        Intake/Output Summary (Last 24 hours) at 10/04/2022 1225 Last data filed at 10/04/2022 0727 Gross per 24 hour  Intake 876.28 ml  Output --  Net 876.28 ml   Filed Weights   10/02/22 0637 10/02/22 1317 10/04/22 0515  Weight: 76.6 kg 77.4 kg 77.3 kg    Examination:  General exam: Appears calm and comfortable  Respiratory system: Clear to auscultation. Respiratory effort normal. No respiratory distress. No conversational dyspnea.  On nasal cannula O2 Cardiovascular system: S1 & S2 heard, RRR. No murmurs. No pedal edema. Gastrointestinal system: Abdomen is nondistended, soft and nontender. Normal bowel sounds heard. Central nervous system: Alert and oriented. No focal neurological deficits. Speech clear.  Extremities: Symmetric in appearance  Skin: No rashes, lesions or ulcers on exposed skin  Psychiatry: Judgement and insight appear normal. Mood & affect appropriate.   Data Reviewed: I have personally reviewed following labs and imaging studies  CBC: Recent Labs  Lab 09/29/22 0656 09/30/22 1142 10/02/22 1234  WBC 13.1* 12.9* 13.3*  NEUTROABS 10.7* 10.6* 11.0*  HGB 7.8* 8.2* 8.5*  HCT 25.0* 26.1* 26.8*  MCV 83.3 82.3 84.0  PLT 267 283 352   Basic Metabolic Panel: Recent Labs  Lab 09/29/22 0656 09/30/22 1142 10/02/22 0735 10/02/22 1234 10/03/22 0629 10/04/22 0553  NA 127* 126* 126* 128* 128* 128*  K 5.3* 6.0* 5.2* 5.3* 4.0 4.1  CL 90* 90* 90* 85* 92* 93*  CO2 23 20* 23 24 23 24   GLUCOSE 97 105* 90 109* 87 107*  BUN 68* 94* 61* 61* 27* 39*  CREATININE 7.87* 10.14* 8.75* 9.09* 5.67* 7.44*  CALCIUM 8.3* 8.5* 8.4* 8.9 7.8* 8.1*  PHOS 4.4  --   --  4.5 3.8 3.8   GFR: Estimated  Creatinine Clearance: 11.1 mL/min (A) (by C-G formula based on SCr of 7.44 mg/dL (H)). Liver Function Tests: Recent Labs  Lab 09/29/22 0656 10/02/22 1234 10/03/22 0629 10/04/22 0553  ALBUMIN 2.0* 2.1* 1.6* 1.7*   No results for input(s): "LIPASE", "AMYLASE" in the last 168 hours. No results for input(s): "AMMONIA" in the last 168 hours. Coagulation Profile: No results for input(s): "INR", "PROTIME" in the last 168 hours. Cardiac Enzymes: No results for input(s): "CKTOTAL", "CKMB", "CKMBINDEX", "TROPONINI" in the last 168 hours. BNP (last 3 results) No results for input(s): "PROBNP" in the last 8760 hours. HbA1C: No results for input(s): "HGBA1C" in the last 72 hours. CBG: Recent Labs  Lab 09/29/22 0602  GLUCAP 102*   Lipid Profile: No results for input(s): "CHOL", "HDL", "LDLCALC", "TRIG", "CHOLHDL", "LDLDIRECT" in the last 72 hours. Thyroid Function Tests: No results for input(s): "  TSH", "T4TOTAL", "FREET4", "T3FREE", "THYROIDAB" in the last 72 hours. Anemia Panel: No results for input(s): "VITAMINB12", "FOLATE", "FERRITIN", "TIBC", "IRON", "RETICCTPCT" in the last 72 hours. Sepsis Labs: Recent Labs  Lab 10/02/22 1234  PROCALCITON 5.26    No results found for this or any previous visit (from the past 240 hour(s)).    Radiology Studies: No results found.    Scheduled Meds:  (feeding supplement) PROSource Plus  30 mL Oral TID BM   benzonatate  100 mg Oral BID   calcitRIOL  0.5 mcg Oral Q T,Th,Sat-1800   Chlorhexidine Gluconate Cloth  6 each Topical Q12H   cinacalcet  30 mg Oral Q supper   darbepoetin (ARANESP) injection - DIALYSIS  100 mcg Subcutaneous Q Thu-1800   feeding supplement (NEPRO CARB STEADY)  237 mL Oral TID BM   fluticasone  1 spray Each Nare Daily   guaiFENesin  600 mg Oral BID   heparin  5,000 Units Subcutaneous Q8H   loratadine  10 mg Oral Daily   methocarbamol  250 mg Oral BID   midodrine  10 mg Oral TID WC   multivitamin  1 tablet Oral QHS    pantoprazole  40 mg Oral Daily   sucroferric oxyhydroxide  1,000 mg Oral TID WC   traZODone  150 mg Oral QHS   Continuous Infusions:   ceFAZolin (ANCEF) IV 2 g (10/02/22 1909)    ceFAZolin (ANCEF) IV 3 g (09/28/22 0123)     LOS: 9 days   Time spent: 25 minutes   Noralee Stain, DO [facility] 10/04/2022, 12:25 PM   Available via Epic secure chat 7am-7pm After these hours, please refer to coverage provider listed on [email or web address]

## 2022-10-04 NOTE — Progress Notes (Signed)
Speech Language Pathology Daily Session Note  Patient Details  Name: Ryan [name] MRN: [id number] Date of Birth: 12-Oct-1960  Today's Date: 10/04/2022 SLP Individual Time: 0705-0800 SLP Individual Time Calculation (min): 55 min  Short Term Goals: Week 2: SLP Short Term Goal 1 (Week 2): STGs = LTGs 2* ELOS  Skilled Therapeutic Interventions: Skilled treatment session focused on cognitive goals. Upon arrival, patient was asleep in bed and required extra time for arousal. SLP encouraged patient to consume his breakfast meal as he did not eat his lunch or dinner from the day before. SLP provided intermittent assistance with tray set-up and Mod verbal cues for patient to alternate attention between self-feeding and a functional conversation as patient frequently spilling condiments. SLP facilitated session by providing overall supervision level verbal cues for complex problem solving during a high-level organization task. Patient demonstrated appropriate attention to task independently. Patient left upright in bed with alarm on and all needs within reach. Continue with current plan of care.      Pain Pain Assessment Pain Scale: 0-10 Pain Score: 8  Pain Type: Chronic pain Pain Location: Back Pain Orientation: Lower Pain Descriptors / Indicators: Aching;Discomfort Pain Frequency: Intermittent Pain Onset: On-going Patients Stated Pain Goal: 2 Pain Intervention(s): Medication (See eMAR)  Therapy/Group: Individual Therapy  Kaylie Ritter 10/04/2022, 12:45 PM

## 2022-10-04 NOTE — Progress Notes (Signed)
Physical Therapy Session Note  Patient Details  Name: Ryan [name] MRN: [id number] Date of Birth: 12/18/1960  Today's Date: 10/04/2022 PT Individual Time: 1120-1220 PT Individual Time Calculation (min): 60 min   Short Term Goals: Week 1:  PT Short Term Goal 1 (Week 1): Pt will complete sit<>stand transfer with CGA and LRAD PT Short Term Goal 2 (Week 1): Pt will ambulate 200' with LRAD CGA PT Short Term Goal 3 (Week 1): Pt will complete bed mobility CGA without hospital bed features PT Short Term Goal 4 (Week 1): Pt will complete up/down 1 curb step with RW CGA  Skilled Therapeutic Interventions/Progress Updates: Patient supine in bed on entrance to room. Patient alert and agreeable to PT session.   Patient reported no pain at beginning of session, and reported no pain when ambulating to bathroom earlier in low back after recent episode of low back strain. BP monitored throughout session as indicated in "vitals." Pt also with nasal canula donned per desat episode the previous night (on 2.5L). PTA donned B ace wrap on top of TED thigh highs dependently, and abdominal brace prior to ambulating. Pt also asked for pain medication at beginning of session before ambulating. RN alerted to provide medication.   Therapeutic Activity: Bed Mobility: Pt performed supine<>sit on EOB with supervision for safety (HOB elevated). Transfers: Pt performed sit<>stand transfers throughout session with supervision for safety.   Therapeutic Exercise: Ambulation to increase cardiovascular endurance. - Pt ambulated from room to and around dayroom/nursing station loop with supervision for safety and with rollator (PTA managing O2 tank on 2L). Pt with decreased cadence as to avoid increasing low back pain (no reports of pain throughout session). Pt required rest break after about 200' on rollator seat. Vitals checked as indicated below. Pt stated to feel like HR has increased as noted below, but subsided after a  few minutes in sitting. Pt denied SOB. Pt ambulated rest of the way back to room and sat back down in rollator while PTA gathered WC together for pt to sit. Pt complained of heart burn and asked for heart burn medication. PTA alerted RN to obtain medication after pt was secured in [facility]. Pt ambulated a few more steps to [facility] with supervision.   Patient in [facility] at end of session with brakes locked, belt alarm set, and all needs within reach.      Therapy Documentation Precautions:  Precautions Precautions: Fall Restrictions Weight Bearing Restrictions: No  Vitals: 88/62 (71) supine in bed 98/62 (71) standing 111/62 (78) after break in ambulation - 98% SpO2; 107 HR   Therapy/Group: Individual Therapy  Thaxton Pelley PTA 10/04/2022, 3:09 PM

## 2022-10-05 ENCOUNTER — Inpatient Hospital Stay (HOSPITAL_COMMUNITY): Payer: No Typology Code available for payment source

## 2022-10-05 DIAGNOSIS — J690 Pneumonitis due to inhalation of food and vomit: Secondary | ICD-10-CM | POA: Diagnosis not present

## 2022-10-05 DIAGNOSIS — J9601 Acute respiratory failure with hypoxia: Secondary | ICD-10-CM | POA: Diagnosis not present

## 2022-10-05 DIAGNOSIS — E43 Unspecified severe protein-calorie malnutrition: Secondary | ICD-10-CM | POA: Diagnosis not present

## 2022-10-05 DIAGNOSIS — J188 Other pneumonia, unspecified organism: Secondary | ICD-10-CM | POA: Diagnosis not present

## 2022-10-05 DIAGNOSIS — I5031 Acute diastolic (congestive) heart failure: Secondary | ICD-10-CM | POA: Diagnosis not present

## 2022-10-05 DIAGNOSIS — I76 Septic arterial embolism: Secondary | ICD-10-CM | POA: Diagnosis not present

## 2022-10-05 DIAGNOSIS — R918 Other nonspecific abnormal finding of lung field: Secondary | ICD-10-CM | POA: Diagnosis not present

## 2022-10-05 DIAGNOSIS — R000 Tachycardia, unspecified: Secondary | ICD-10-CM

## 2022-10-05 DIAGNOSIS — I77819 Aortic ectasia, unspecified site: Secondary | ICD-10-CM | POA: Diagnosis not present

## 2022-10-05 DIAGNOSIS — N186 End stage renal disease: Secondary | ICD-10-CM | POA: Diagnosis not present

## 2022-10-05 DIAGNOSIS — T8612 Kidney transplant failure: Secondary | ICD-10-CM | POA: Diagnosis not present

## 2022-10-05 DIAGNOSIS — T80211D Bloodstream infection due to central venous catheter, subsequent encounter: Secondary | ICD-10-CM | POA: Diagnosis not present

## 2022-10-05 DIAGNOSIS — I330 Acute and subacute infective endocarditis: Secondary | ICD-10-CM | POA: Diagnosis not present

## 2022-10-05 DIAGNOSIS — J811 Chronic pulmonary edema: Secondary | ICD-10-CM | POA: Diagnosis not present

## 2022-10-05 DIAGNOSIS — Z1152 Encounter for screening for COVID-19: Secondary | ICD-10-CM | POA: Diagnosis not present

## 2022-10-05 DIAGNOSIS — I2690 Septic pulmonary embolism without acute cor pulmonale: Secondary | ICD-10-CM | POA: Diagnosis not present

## 2022-10-05 DIAGNOSIS — J90 Pleural effusion, not elsewhere classified: Secondary | ICD-10-CM | POA: Diagnosis not present

## 2022-10-05 LAB — CBC WITH DIFFERENTIAL/PLATELET
Abs Immature Granulocytes: 0.31 10*3/uL — ABNORMAL HIGH (ref 0.00–0.07)
Basophils Absolute: 0.1 10*3/uL (ref 0.0–0.1)
Basophils Relative: 0 %
Eosinophils Absolute: 0.2 10*3/uL (ref 0.0–0.5)
Eosinophils Relative: 1 %
HCT: 22.8 % — ABNORMAL LOW (ref 39.0–52.0)
Hemoglobin: 7 g/dL — ABNORMAL LOW (ref 13.0–17.0)
Immature Granulocytes: 2 %
Lymphocytes Relative: 6 %
Lymphs Abs: 0.9 10*3/uL (ref 0.7–4.0)
MCH: 25.5 pg — ABNORMAL LOW (ref 26.0–34.0)
MCHC: 30.7 g/dL (ref 30.0–36.0)
MCV: 83.2 fL (ref 80.0–100.0)
Monocytes Absolute: 1 10*3/uL (ref 0.1–1.0)
Monocytes Relative: 7 %
Neutro Abs: 12.3 10*3/uL — ABNORMAL HIGH (ref 1.7–7.7)
Neutrophils Relative %: 84 %
Platelets: 344 10*3/uL (ref 150–400)
RBC: 2.74 MIL/uL — ABNORMAL LOW (ref 4.22–5.81)
RDW: 16 % — ABNORMAL HIGH (ref 11.5–15.5)
WBC: 14.7 10*3/uL — ABNORMAL HIGH (ref 4.0–10.5)
nRBC: 0.2 % (ref 0.0–0.2)

## 2022-10-05 LAB — RENAL FUNCTION PANEL
Albumin: 1.8 g/dL — ABNORMAL LOW (ref 3.5–5.0)
Anion gap: 10 (ref 5–15)
BUN: 27 mg/dL — ABNORMAL HIGH (ref 8–23)
CO2: 26 mmol/L (ref 22–32)
Calcium: 8.2 mg/dL — ABNORMAL LOW (ref 8.9–10.3)
Chloride: 94 mmol/L — ABNORMAL LOW (ref 98–111)
Creatinine, Ser: 5.79 mg/dL — ABNORMAL HIGH (ref 0.61–1.24)
GFR, Estimated: 10 mL/min — ABNORMAL LOW (ref >60)
Glucose, Bld: 106 mg/dL — ABNORMAL HIGH (ref 70–99)
Phosphorus: 3.9 mg/dL (ref 2.5–4.6)
Potassium: 3.6 mmol/L (ref 3.5–5.1)
Sodium: 130 mmol/L — ABNORMAL LOW (ref 135–145)

## 2022-10-05 LAB — BASIC METABOLIC PANEL
Anion gap: 15 (ref 5–15)
BUN: 36 mg/dL — ABNORMAL HIGH (ref 8–23)
CO2: 25 mmol/L (ref 22–32)
Calcium: 8.5 mg/dL — ABNORMAL LOW (ref 8.9–10.3)
Chloride: 89 mmol/L — ABNORMAL LOW (ref 98–111)
Creatinine, Ser: 6.93 mg/dL — ABNORMAL HIGH (ref 0.61–1.24)
GFR, Estimated: 8 mL/min — ABNORMAL LOW (ref >60)
Glucose, Bld: 121 mg/dL — ABNORMAL HIGH (ref 70–99)
Potassium: 3.8 mmol/L (ref 3.5–5.1)
Sodium: 129 mmol/L — ABNORMAL LOW (ref 135–145)

## 2022-10-05 LAB — LACTIC ACID, PLASMA
Lactic Acid, Venous: 1.3 mmol/L (ref 0.5–1.9)
Lactic Acid, Venous: 1 mmol/L (ref 0.5–1.9)

## 2022-10-05 LAB — PROCALCITONIN: Procalcitonin: 5.04 ng/mL

## 2022-10-05 MED ORDER — PIPERACILLIN-TAZOBACTAM IN DEX 2-0.25 GM/50ML IV SOLN
2.2500 g | Freq: Three times a day (TID) | INTRAVENOUS | Status: DC
  Administered 2022-10-06 – 2022-10-08 (×8): 2.25 g via INTRAVENOUS
  Filled 2022-10-05 (×11): qty 50

## 2022-10-05 NOTE — Progress Notes (Signed)
PROGRESS NOTE    Ryan [name]  WUJ:[id number] DOB: 1960/10/14 DOA: 09/25/2022 PCP: Sharin Grave, MD     Brief Narrative:  Ryan [name] is a 62 y.o. male with past medical history of hypertension, hyperlipidemia, and ESRD on hemodialysis who initially presented to the hospital on 6/6 with complaints of low back pain ultimately found to have evidence of L5/S1 discitis and osteomyelitis as well as MSSA bacteremia with mitral valve endocarditis.  CT scan of the chest noted septic emboli with new airspace opacities in the left upper lobe . MRI brain noting scattered subcentimeter infarcts involving the bilateral cerebral hemisphere and right cerebellum.  CT surgery has been consulted but recommended conservative management as.  He was already responding to antibiotics.  Plan was for patient to continue on antibiotics at cefazolin until 8/13 per ID recommendations.  Hospital course had also been complicated by small bowel obstruction which has since resolved.  Patient had been discharged to inpatient rehab on 7/4.  Patient reports that he has had an intermittent cough that is mostly nonproductive that started during his hospitalization.  Reports cough seems to worsen with talking.  He has intermittently gotten choked up with certain foods.  His last bowel movement was approximately 2 days ago.  Chest x-ray noted poor inspiratory effort with no acute abnormality noted.  Labs from today note WBC 13.3, hemoglobin 8.5, sodium 126, potassium 5.2, BUN 61, creatinine 8.75, and BNP 355.   New events last 24 hours / Subjective: States that he had to cut his dialysis early yesterday because of tachycardia.  There is a note from dialysis RN that patient had A-fib during treatment.  There is no other notes indicating that he had A-fib.  Patient denies any history of A-fib.  This morning, patient continues to have a cough especially when laying down.  Denies any shortness of breath.  He is off oxygen  today.  Assessment & Plan:   Principal Problem:   Acute osteomyelitis of lumbar spine (HCC) Active Problems:   Leukocytosis   Cough   ESRD on dialysis (HCC)   Hyperkalemia   Diskitis   Persistent dry cough -Could be in setting of postnasal drainage, septic emboli, volume overload, atelectasis -Chest x-ray showed poor expiratory effort, no acute abnormality or focal infiltrate seen -No signs of pneumonia at this time or aspiration risk -Continue incentive strategy, flutter valve -Mucinex, Tessalon Perles -Claritin and Flonase -Volume management with dialysis  Tachycardia -Question if he had A-fib and dialysis yesterday -EKG today reviewed independently shows sinus tachycardia with rate 105  Other medical issues including: L5/S1 discitis and osteomyelitis, MSSA bacteremia with mitral valve endocarditis, septic emboli to the lungs, septic emboli to the brain Persistent leukocytosis in setting of infection as above ESRD on dialysis Anemia of chronic disease Small bowel obstruction, resolved -Patient remains on cefazolin until 8/13 -Dialysis per nephrology -Continue rehab efforts   TRH will check back to see patient tomorrow   Antimicrobials:  Anti-infectives (From admission, onward)    Start     Dose/Rate Route Frequency Ordered Stop   09/30/22 1800  ceFAZolin (ANCEF) IVPB 2g/100 mL premix       Note to Pharmacy: can we give 3gm of ancef tomorrow instead of 2gm? (Only for satursdays)   2 g 200 mL/hr over 30 Minutes Intravenous Once per day on Tue Thu 09/26/22 1107 11/04/22 2359   09/27/22 1800  ceFAZolin (ANCEF) IVPB 2g/100 mL premix  Status:  Discontinued  Note to Pharmacy: can we give 3gm of ancef tomorrow instead of 2gm? (Only for satursdays)   2 g 200 mL/hr over 30 Minutes Intravenous Every T-Th-Sa (1800) 09/26/22 0809 09/26/22 1107   09/27/22 1800  ceFAZolin (ANCEF) IVPB 3g/100 mL premix        3 g 200 mL/hr over 30 Minutes Intravenous Once per day on Sat  09/26/22 1107 11/01/22 2359   09/25/22 1800  ceFAZolin (ANCEF) IVPB 2g/100 mL premix  Status:  Discontinued        2 g 200 mL/hr over 30 Minutes Intravenous Every T-Th-Sa (1800) 09/25/22 1647 09/26/22 0809        Objective: Vitals:   10/04/22 2354 10/05/22 0147 10/05/22 0504 10/05/22 0515  BP: (!) 112/59 94/68 102/60   Pulse: (!) 109 (!) 103 100   Resp: 18 18 18    Temp: 98.7 F (37.1 C) 99.5 F (37.5 C) 99.9 F (37.7 C)   TempSrc: Oral Oral Oral   SpO2: 96% 98% 98%   Weight:    77.2 kg  Height:        Intake/Output Summary (Last 24 hours) at 10/05/2022 1216 Last data filed at 10/04/2022 1852 Gross per 24 hour  Intake 120 ml  Output 0 ml  Net 120 ml   Filed Weights   10/02/22 1317 10/04/22 0515 10/05/22 0515  Weight: 77.4 kg 77.3 kg 77.2 kg    Examination:  General exam: Appears calm and comfortable  Respiratory system: Clear to auscultation. Respiratory effort normal. No respiratory distress. No conversational dyspnea.  On room air Cardiovascular system: S1 & S2 heard, tachycardic, regular rhythm. No murmurs. No pedal edema. Gastrointestinal system: Abdomen is nondistended, soft and nontender. Normal bowel sounds heard. Central nervous system: Alert and oriented. No focal neurological deficits. Speech clear.  Extremities: Symmetric in appearance  Skin: No rashes, lesions or ulcers on exposed skin  Psychiatry: Judgement and insight appear normal. Mood & affect appropriate.   Data Reviewed: I have personally reviewed following labs and imaging studies  CBC: Recent Labs  Lab 09/29/22 0656 09/30/22 1142 10/02/22 1234  WBC 13.1* 12.9* 13.3*  NEUTROABS 10.7* 10.6* 11.0*  HGB 7.8* 8.2* 8.5*  HCT 25.0* 26.1* 26.8*  MCV 83.3 82.3 84.0  PLT 267 283 352   Basic Metabolic Panel: Recent Labs  Lab 09/29/22 0656 09/30/22 1142 10/02/22 0735 10/02/22 1234 10/03/22 0629 10/04/22 0553 10/05/22 0531  NA 127*   < > 126* 128* 128* 128* 130*  K 5.3*   < > 5.2* 5.3* 4.0  4.1 3.6  CL 90*   < > 90* 85* 92* 93* 94*  CO2 23   < > 23 24 23 24 26   GLUCOSE 97   < > 90 109* 87 107* 106*  BUN 68*   < > 61* 61* 27* 39* 27*  CREATININE 7.87*   < > 8.75* 9.09* 5.67* 7.44* 5.79*  CALCIUM 8.3*   < > 8.4* 8.9 7.8* 8.1* 8.2*  PHOS 4.4  --   --  4.5 3.8 3.8 3.9   < > = values in this interval not displayed.   GFR: Estimated Creatinine Clearance: 14.3 mL/min (A) (by C-G formula based on SCr of 5.79 mg/dL (H)). Liver Function Tests: Recent Labs  Lab 09/29/22 0656 10/02/22 1234 10/03/22 0629 10/04/22 0553 10/05/22 0531  ALBUMIN 2.0* 2.1* 1.6* 1.7* 1.8*   No results for input(s): "LIPASE", "AMYLASE" in the last 168 hours. No results for input(s): "AMMONIA" in the last 168 hours. Coagulation Profile:  No results for input(s): "INR", "PROTIME" in the last 168 hours. Cardiac Enzymes: No results for input(s): "CKTOTAL", "CKMB", "CKMBINDEX", "TROPONINI" in the last 168 hours. BNP (last 3 results) No results for input(s): "PROBNP" in the last 8760 hours. HbA1C: No results for input(s): "HGBA1C" in the last 72 hours. CBG: Recent Labs  Lab 09/29/22 0602  GLUCAP 102*   Lipid Profile: No results for input(s): "CHOL", "HDL", "LDLCALC", "TRIG", "CHOLHDL", "LDLDIRECT" in the last 72 hours. Thyroid Function Tests: No results for input(s): "TSH", "T4TOTAL", "FREET4", "T3FREE", "THYROIDAB" in the last 72 hours. Anemia Panel: No results for input(s): "VITAMINB12", "FOLATE", "FERRITIN", "TIBC", "IRON", "RETICCTPCT" in the last 72 hours. Sepsis Labs: Recent Labs  Lab 10/02/22 1234  PROCALCITON 5.26    No results found for this or any previous visit (from the past 240 hour(s)).    Radiology Studies: No results found.    Scheduled Meds:  (feeding supplement) PROSource Plus  30 mL Oral TID BM   benzonatate  100 mg Oral BID   calcitRIOL  0.5 mcg Oral Q T,Th,Sat-1800   Chlorhexidine Gluconate Cloth  6 each Topical Q12H   cinacalcet  30 mg Oral Q supper    darbepoetin (ARANESP) injection - DIALYSIS  100 mcg Subcutaneous Q Thu-1800   feeding supplement (NEPRO CARB STEADY)  237 mL Oral TID BM   fluticasone  1 spray Each Nare Daily   guaiFENesin  600 mg Oral BID   heparin  5,000 Units Subcutaneous Q8H   loratadine  10 mg Oral Daily   methocarbamol  250 mg Oral BID   midodrine  10 mg Oral TID WC   multivitamin  1 tablet Oral QHS   pantoprazole  40 mg Oral Daily   sucroferric oxyhydroxide  1,000 mg Oral TID WC   traZODone  150 mg Oral QHS   Continuous Infusions:   ceFAZolin (ANCEF) IV 2 g (10/02/22 1909)    ceFAZolin (ANCEF) IV 3 g (10/04/22 2042)     LOS: 10 days   Time spent: 25 minutes   Noralee Stain, DO [facility] 10/05/2022, 12:16 PM   Available via Epic secure chat 7am-7pm After these hours, please refer to coverage provider listed on [email or web address]

## 2022-10-05 NOTE — Progress Notes (Signed)
Pharmacy Antibiotic Note  Ryan [name] is a 62 y.o. male admitted on 09/25/2022 with  aspiration pneumonia .  Pharmacy has been consulted for Zosyn dosing.  Patient is currently being treated for endocarditis, osteomyelitis and a possible pulmonary septic emboli with cefazolin. Patient is ESRD on TTS HD schedule. He recently had elevated temperatures, with a Tmax of 100 and has been persistently tachycardic. CXR on 7/14 suggestive of possible aspiration pneumonia, warranting broadening of antibiotics at this time.   Plan: Zosyn 2.25 grams IV Q8hrs  While patient is receiving Zosyn, discontinue cefazolin Monitor clinical status and for s/sx of improvement Follow-up length of therapy, cultures, and resuming cefazolin as appropriate  Height: 5\' 11"  (180.3 cm) Weight: 77.2 kg (170 lb 3.1 oz) IBW/kg (Calculated) : 75.3  Temp (24hrs), Avg:99.5 F (37.5 C), Min:98.7 F (37.1 C), Max:100 F (37.8 C)  Recent Labs  Lab 09/29/22 0656 09/30/22 1142 10/02/22 0735 10/02/22 1234 10/03/22 0629 10/04/22 0553 10/05/22 0531 10/05/22 2023  WBC 13.1* 12.9*  --  13.3*  --   --   --  14.7*  CREATININE 7.87* 10.14*   < > 9.09* 5.67* 7.44* 5.79* 6.93*  LATICACIDVEN  --   --   --   --   --   --   --  1.3   < > = values in this interval not displayed.    Estimated Creatinine Clearance: 11.9 mL/min (A) (by C-G formula based on SCr of 6.93 mg/dL (H)).    Allergies  Allergen Reactions   Iodinated Contrast Media Itching and Nausea Only    Probably needs prednisone prep prior to contrast   Z-Pak [Azithromycin] Nausea And Vomiting and Other (See Comments)    Chest tightness GI Intolerance    Antimicrobials this admission: Vancomcyin x 1 on 6/16 Cefepime x 1 on 6/16 Metronidazole x 1 on 6/16 Cefazolin 6/16 >> (8/13)   Microbiology results: 6/16 blood: 4/4 MSSA  6/16 resp panel: neg 6/16 COVID: neg 6/17 blood: neg 6/19 blood: neg 6/20 MRSA PCR: neg 7/14 Bcx: pending  Thank you for  allowing pharmacy to be a part of this patient's care.  Lennie Muckle, PharmD PGY1 Pharmacy Resident 10/05/2022 10:24 PM

## 2022-10-05 NOTE — Progress Notes (Signed)
Speech Language Pathology Daily Session Note  Patient Details  Name: Ryan [name] MRN: [id number] Date of Birth: 08/06/1960  Today's Date: 10/05/2022 SLP Individual Time: 1455-1535 SLP Individual Time Calculation (min): 40 min  Short Term Goals: Week 2: SLP Short Term Goal 1 (Week 2): STGs = LTGs 2* ELOS  Skilled Therapeutic Interventions:  Pt was seen for skilled ST targeting cognitive goals.  Pt was working with OT for unscheduled make up session upon therapist's arrival .  Pt agreeable to participating in treatment with SLP.  Pt reported that he felt his cognition has been improving while on CIR but continues to endorse changes to memory.  Pt independently identified 2 compensatory memory strategies to facilitate improved recall of daily information.  SLP provided skilled education regarding additional memory strategies.  Pt reported that he typically writes down important information that he doesn't want to forget but he currently doesn't have materials in his room to do that her on rehab.  SLP provided pt with a memory notebook and pen for pt to use while inpatient.  Towards the end of today's session, pt began complaining of increased work of breathing.   SLP attempted to check O2 sats but machine would not give a specific reading, only alarming that pt's sats were between 85-90.  Pt requested to place supplemental O2 prior to therapist rechecking for specific sats as he was beginning to feel uncomfortable.  With 2L supplemental O2, therapist was able to get an O2 reading of 93-96.  Pt reported improvement in symptoms but noted wheezing.  RN was made aware.   Pt was left in bed with bed alarm set and call bell within reach.  Continue per current plan of care.    Pain Pain Assessment Pain Scale: 0-10 Pain Score: 0-No pain  Therapy/Group: Individual Therapy  Paola Aleshire, Melanee Spry 10/05/2022, 4:23 PM

## 2022-10-05 NOTE — Progress Notes (Signed)
Physical Therapy Session Note  Patient Details  Name: Ryan [name] MRN: [id number] Date of Birth: June 13, 1960  Today's Date: 10/05/2022 PT Individual Time: 2130-8657 PT Individual Time Calculation (min): 45 min   Short Term Goals: Week 1:  PT Short Term Goal 1 (Week 1): Pt will complete sit<>stand transfer with CGA and LRAD PT Short Term Goal 2 (Week 1): Pt will ambulate 200' with LRAD CGA PT Short Term Goal 3 (Week 1): Pt will complete bed mobility CGA without hospital bed features PT Short Term Goal 4 (Week 1): Pt will complete up/down 1 curb step with RW CGA  Skilled Therapeutic Interventions/Progress Updates: Pt presented in bed agreeable to therapy. Pt initially quiet lethargic at beginning of session but became more alert during session. Pt indicated had ace bandages had been on all night and requested for them not to be placed today. Explained that he needs to have them removed each evening and if at HD try to have them removed prior to leaving CIR. Pt verbalized understanding. PTA donned TED hose total A. Pt performed ankle pumps, hip abd/add, and heel slides x 10 bilaterally prior to performing bed mobility. Performed supine to sit with use of bed features and CGA. PTA donned lumbar corset for time management. Pt with no s/s of hypotension with PTA having pt perform LAQ and seated marches x 10. Pt then stood with rollator CGA and after BP obtained pt ambulated ~143ft in hallway with rollator and CGA. Pt indicated no signs of OH and maintained good balance with ambulation. Pt did state felt short of breath towards end of ambulation with SpO2 checked upon sitting and at 94% on RA. Pt transferred back to supine with supervision and use of bed features and required increased time to reposition to comfort. Pt left in bed at end of session with bed alarm on, call bell within reach and needs met.   Vitals Supine: 99/55 (70) HR 101 Sitting : 107/63 (77) HR 109 Standing 117/63 (77) HR  119  Pt on RA throughout session with SpO2 >90% with all activity. Nursing notified     Therapy Documentation Precautions:  Precautions Precautions: Fall Restrictions Weight Bearing Restrictions: No General:   Vital Signs:   Pain:   Mobility:   Locomotion :    Trunk/Postural Assessment :    Balance:   Exercises:   Other Treatments:      Therapy/Group: Individual Therapy  Jewel Mcafee 10/05/2022, 4:16 PM

## 2022-10-05 NOTE — Progress Notes (Signed)
PROGRESS NOTE   Subjective/Complaints:  Pt doing alright, had some tachycardia yesterday into today, since dialysis. Says he had heartburn in dialysis and thinks it was from not taking enough water with his pills-- that has resolved today, and otherwise he has no symptoms with his tachycardia. No dizziness, lightheadedness, CP, SOB, numbness/tingling, etc.  Not needing O2 today.  Slept ok, pain well controlled, LBM this morning.  No other complaints or concerns today.      ROS:   Pt denies SOB, abd pain, CP, N/V/C/D, dizziness/lightheadedness, paresthesias, and vision changes   Except for HPI  Objective:   No results found. Recent Labs    10/02/22 1234  WBC 13.3*  HGB 8.5*  HCT 26.8*  PLT 352   Recent Labs    10/04/22 0553 10/05/22 0531  NA 128* 130*  K 4.1 3.6  CL 93* 94*  CO2 24 26  GLUCOSE 107* 106*  BUN 39* 27*  CREATININE 7.44* 5.79*  CALCIUM 8.1* 8.2*    Intake/Output Summary (Last 24 hours) at 10/05/2022 1117 Last data filed at 10/04/2022 1852 Gross per 24 hour  Intake 120 ml  Output 0 ml  Net 120 ml         Physical Exam: Vital Signs Blood pressure 102/60, pulse 100, temperature 99.9 F (37.7 C), temperature source Oral, resp. rate 18, height 5\' 11"  (1.803 m), weight 77.2 kg, SpO2 98%.  General: awake, alert, appropriate, sitting up in bed, NAD HENT: conjugate gaze; oropharynx a tad dry CV: very slightly tachycardic, normal rhythm, soft systolic ?flow murmur heard at LSB and L lateral chest.  Pulmonary: decreased at bases- faint wheeze heard once but cleared with cough, no rhonchi/rales appreciated, slightly shallow breathing but not really increased WOB GI: soft, NT, ND, (+)BS- a little sluggish Psychiatric: appropriate Neurological: Ox3  PRIOR EXAMS: Skin: + L upper chest HD catheter - dressing c/d/i + Nonpainful erythematous lesions on bilateral feet  MSK:      No apparent  deformity.      Strength: Exam limited d/t being on HD; all 4 limbs equal, antigravity and against resistance   Neurologic exam:  Cognition: AAO to person, place, time and event.  + Mild lethargy - intermittently falling asleep during late dialysis; more awake in early exam Language: Fluent, No substitutions or neoglisms. No dysarthria. Names 3/3 objects correctly.  Memory: Recalls 3/3 objects at 5 minutes. No apparent deficits  Insight: Good  insight into current condition.  Mood: Pleasant affect, appropriate mood.  Sensation: To light touch intact in BL UEs and LEs  Reflexes: 1+ in BL UE and LEs. Negative Hoffman's and babinski signs bilaterally.  CN: 2-12 grossly intact.  Coordination: No apparent tremors. No ataxia on FTN, HTS bilaterally.  Spasticity: MAS 0 in all extremities.       Assessment/Plan: 1. Functional deficits which require 3+ hours per day of interdisciplinary therapy in a comprehensive inpatient rehab setting. Physiatrist is providing close team supervision and 24 hour management of active medical problems listed below. Physiatrist and rehab team continue to assess barriers to discharge/monitor patient progress toward functional and medical goals  Care Tool:  Bathing    Body parts bathed  by patient: Right arm, Left arm, Chest, Abdomen, Front perineal area, Buttocks, Right upper leg, Left upper leg, Right lower leg, Left lower leg, Face         Bathing assist Assist Level: Set up assist     Upper Body Dressing/Undressing Upper body dressing   What is the patient wearing?: Button up shirt    Upper body assist Assist Level: Minimal Assistance - Patient > 75%    Lower Body Dressing/Undressing Lower body dressing      What is the patient wearing?: Underwear/pull up     Lower body assist Assist for lower body dressing: Minimal Assistance - Patient > 75%     Toileting Toileting    Toileting assist Assist for toileting: Supervision/Verbal cueing      Transfers Chair/bed transfer  Transfers assist     Chair/bed transfer assist level: Supervision/Verbal cueing     Locomotion Ambulation   Ambulation assist      Assist level: Minimal Assistance - Patient > 75% Assistive device: Rollator Max distance: 170'   Walk 10 feet activity   Assist     Assist level: Minimal Assistance - Patient > 75% Assistive device: Walker-rolling   Walk 50 feet activity   Assist    Assist level: Minimal Assistance - Patient > 75% Assistive device: Walker-rolling    Walk 150 feet activity   Assist    Assist level: Minimal Assistance - Patient > 75% Assistive device: Walker-rolling    Walk 10 feet on uneven surface  activity   Assist Walk 10 feet on uneven surfaces activity did not occur: Safety/medical concerns         Wheelchair     Assist Is the patient using a wheelchair?: Yes Type of Wheelchair: Manual    Wheelchair assist level: Dependent - Patient 0% (pain limits)      Wheelchair 50 feet with 2 turns activity    Assist        Assist Level: Dependent - Patient 0%   Wheelchair 150 feet activity     Assist      Assist Level: Dependent - Patient 0%   Blood pressure 102/60, pulse 100, temperature 99.9 F (37.7 C), temperature source Oral, resp. rate 18, height 5\' 11"  (1.803 m), weight 77.2 kg, SpO2 98%.  Medical Problem List and Plan: 1. 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  Antithrombotics: -DVT/anticoagulation:  Pharmaceutical: Heparin 5000 q8h             -antiplatelet therapy: N/A 3. Low back pain/Pain Management: tylenol, Oxycodone prn. Advised to use oxycodone prior to sleep --will schedule low dose robaxin 250mg   bid as reports ongoing lower back and LE spasms.  -7/5- will increase Oxy to 10-15 mg q4 hours prn-can add long acting over weekend if necessary -7/6-7/24 pain seems controlled for now, monitor 7/12- pain doing much better at 7.5-10 mg q4 hours prn -10/05/22 pain controlled overall 4. Mood/Behavior/Sleep: LCSW to follow for evaluation and support.              -antipsychotic agents: N/A --acute on chronic insomnia in part due to anxiety. Will schedule low dose trazodone. Continue  prn melatonin -7/5- will change trazodone to 75 mg at bedtime- and can be titrated more over weekend if needed.  7/9- sleeping well  7/10- didn't sleep well- will increase trazodone to 150 mg at bedtime 7/12- slept somewhat better- con't trazodone -10/05/22 sleeping better 5. Neuropsych/cognition: This patient is capable of making decisions on his own behalf. 6. Skin/Wound Care: Routine pressure relief measures -continue to monitor new lesions on bilateral feet for evolution/progress   7. Fluids/Electrolytes/Nutrition: Strict I/O. Daily weights. Labs TTS with HD. Add low salt restrictions             --continue Nephro supplements 8. 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  --IV cefazolin with HD. EOT 11/04/22              --Fevers have resolved. CRP-44/Sed rate 1.5 @ admission.  --Leucocytosis trending down from 23.9-->31.4-->13.9.    -7/5- Pt's WBC down to 13/9- will recheck labs in AM as well as qmonday -09/27/22 WBC 13.2, cont to monitor 7/8- WBC stable at 13k- CRP 17.3 and ESR >140- will call ID- they said ESR not real helpful in ESRD pt's- so to make sure CRP improving and pt looks good, to call them back before pt leaves.  Called, since ESR rising- last checked 3 weeks ago and WBC won't resolve. 7/9- ID ok with labs since ESR not a good indicator in ESRD- follow labs weekly.   7/12-  will call ID before leaves rehab 9. SBO w/ enteritis: Has resolved. Surgery team signed off 10. ESRD: HD TTS at the end of the day to help with tolerance of therapy.              --strict I/O with daily weights. Continues to require 4 L oxygen per Lucas.  --On Liberalized diet but 1200 cc FR/4 gram salt.  Velphoro for elevated phos level. -7/5- Na down to 128- per Renal  -09/27/22 dialysis today 7/9- K+_ 5.3- this AM and Na 127- per renal to intervene- they will likely check labs and follow up 7/10- K+ up, but then held HD yesterday. Na 126-  7/11- Na 126 and K_ 5.2 still- also d/w pt with renal about midodrine- per renal.  7/12- Na up to 128-K+ 4.0 11. Anemia of chronic disease: On Aranesp weekly every Thursday w/HD.   7/12- Hb 8.5- better than was of 7.8-8.2 12. HTN: Monitor BP TID. On low dose tenormin 12.5mg  every day but BP with drops during HD.  -7/5- BP running low 90s systolic when laying down- I'm concerned will have orthostatic hypotension-will check with PT/OT  -09/27/22 BPs soft, monitor closely -09/28/22 BP a little softer today, hold atenolol this morning, monitor to see if changes need to be made to that. Denies orthostatic symptoms.  7/8- held atenolol- BP dropped to 60-70's systolic yesterday- was 90s SBP this AM- will monitor- midodrine increased yesterday to 2.5 mg 7/9- will hold atenolol since BP 104 systolic and going to HD today.   7/10- stopped Atenolol and increased Midodrine to 5 mg BID with breakfast/lunch 7/11- increased midodrine to 5 mg TID- but think he might benefit from 10 mg since BP has been running 70s with sitting/standing with therapy.  7/12- BP running 80's this AM- Midodrine up 5-10 mg TID- depending on HD days- asymptomatic as usual- will d/w therapy again.  -7/13-14/24 BPs doing well.  Vitals:   10/04/22 1546 10/04/22 1600 10/04/22  1630 10/04/22 1700  BP: 116/68 120/76 123/79 124/80   10/04/22 1730 10/04/22 1800 10/04/22 1852 10/04/22 1928  BP: 128/76 130/74  120/76 127/67   10/04/22 2133 10/04/22 2354 10/05/22 0147 10/05/22 0504  BP: 120/62 (!) 112/59 94/68 102/60    13. AHRF d/t cavitary pneumonia. Wean oxygen as able. Currently 2-4L; per patient not using CPAP/BiPAP   -7/5- will wean O2 as allowed  -09/27/22 no O2 on today, monitor  -10/04/22 needing O2 last night, doing well today, monitor 14. Constipation:  -09/28/22 had a small BM yesterday, lots of gas, will do dose of miralax now then add on QD PRN dose, advised to take senna with the miralax dose now, then could use the PRN miralax dose after lunch if still not successful-- if not working, then use suppository +/- enema.  7/8- LBM  7/6- small- feels like needs to go- wants to wait on more intervention.   7/9- Had good sized BM yesterday- feeling better  7/10- bowels "going ok".   -10/05/22 LBM this morning, doing well, monitor 15. New cough/mild SOB  7/11- got CXR_ concern about fluid overload/heart decline in working? Due to enlarged cardiac silhouette and appears like effusion/possible infiltrate- Called IM consult to address- CXR read pending.   7/12- CXR shows more atelectasis- will order flutter valve and tessalon pearls 100 mg BID for cough. Pt reports still bothersome   -10/04/22 cough better, needing O2 today but feels ok; hopefully dialysis today will help with volume; monitor  -10/05/22 no O2 today, somewhat shallow breathing but not really increased WOB; monitor closely 16. Severe protein Malnutrition  7/12- Alb down to 1.6- will d/w team about what to add for appetite stimulation- thinking Remeron?will order dietitian consult.   17. Tachycardia:  -10/05/22 tachycardic since dialysis yesterday, but completely asymptomatic-- maybe from midodrine? Recently increased. Monitor closely, doubt need for urgent intervention or work up right now     LOS: 10 days A FACE TO FACE EVALUATION WAS PERFORMED  [address] 10/05/2022, 11:17 AM

## 2022-10-05 NOTE — Progress Notes (Signed)
Occupational Therapy Session Note  Patient Details  Name: Ryan [name] MRN: [id number] Date of Birth: 1961-01-06  Today's Date: 10/05/2022 OT Individual Time: 1440-1455 OT Individual Time Calculation (min): 15 min  and Today's Date: 10/05/2022 OT Missed Time: -45 Minutes Missed Time Reason: Other (comment) (Pt refused first session d/t religious service. OT attempted to see pt later in day and pt had SLP appontment. Will attempt to make up missed time as schedule allows)   Short Term Goals: Week 2:  OT Short Term Goal 1 (Week 2): STG=LTG d/t ELOS  Skilled Therapeutic Interventions/Progress Updates:      Therapy Documentation Precautions:  Precautions Precautions: Fall Restrictions Weight Bearing Restrictions: No  General:  "I'm sorry for refusing earlier." Pt supine in bed upon OT arrival, agreeable to OT session. Handoff from nursing.   Pain: 0/10 reported/noted   ADL: Bed mobility: SBA for supine>EOB, Min A EOB>supine for leg management, using bed rails, increased time to complete Footwear: total A to doff TED hose and socks after session   Direct handoff to SLP. Pt supine in bed with bed alarm activated, 2 bed rails up, call light within reach and 4Ps assessed.   Therapy/Group: Individual Therapy  Velia Meyer, OTD, OTR/L 10/05/2022, 3:27 PM

## 2022-10-05 NOTE — Progress Notes (Signed)
Subjective: Seen in room.,  Hemodialysis yesterday afternoon short because of tachycardia /low back pain no UF, said pain resolved after off HD.  Currently no complaints  Objective Vital signs in last 24 hours: Vitals:   10/04/22 2354 10/05/22 0147 10/05/22 0504 10/05/22 0515  BP: (!) 112/59 94/68 102/60   Pulse: (!) 109 (!) 103 100   Resp: 18 18 18    Temp: 98.7 F (37.1 C) 99.5 F (37.5 C) 99.9 F (37.7 C)   TempSrc: Oral Oral Oral   SpO2: 96% 98% 98%   Weight:    77.2 kg  Height:       Weight change: -0.1 kg  Physical Exam: General: alert pleasant in NAD Heart: Tachycardia pulse 100, regular no MRG Lungs: CTA unlabored breathing Abdomen: NABS , Soft, nt, nd Extremities:bilat compression hose  , no appreciated  edema   Dialysis Access:  L internal jugular TDC dressing d/c/I     OP Dialysis Orders: [facility] TTS  3:45 450/A1.5x 2K/2Ca EDW 85.7kg TDC  -Heparin 3000 units IV TIW  -No ESA -Calcitriol 1.5 MCG PO TIW   Problem/Plan: 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 ID RX  Blood Cx 6/16 MSSA, negative Cx on 6/17, 6/19. TDC removed 6/18. Completed 72 hour line holiday. TTE concerning for endocarditis. TEE cancelled d/t high risk factors. LS MRI with possible L5-S1 discitis. Temp cath placed 6/21, but poor function requiring exchange -  Will be getting IV Cefazolin 2g q HD x 6 weeks (until 11/04/22). Deconditioning /weakness = in CIR Dialysis Access: RIJ TDC removed 6/18 - s/p line holiday, then temp line placed 6/21 with recurrent non-function issues despite exchange 6/26. S/p [facility] 7/1 in IR.  SBO - on CT Ab 6/19. Now resolved, surgery team has signed off. Hyperkalemia: K+ 3.6, noted he was on regular diet. Changed to renal diet. Use 2.0 K bath. Follow K+ levels.  Hyponatremia: Na= 130 this a.m. no evidence of volume overload with hypotension present. He is under OP EDW. Had  NS infusion for 24 hours. Ran even with HD 10/04/2022. Follow  labs.  ESRD: Usual TTS schedule - then line issues preventing HD 6/27 -7/1. Now resolved and HD 7/1, 7/2 to correct. Tolerated HD Th w/ 3.5L net UF and 3L on 7/6. Appears to be euvolemic today; EDW is likely ~75.2-76kg.   Next HD Tuesday 10/07/2022 Perm Access: S/p L AVF ligation in 2019. Last seen by Dr. Edilia Bo 01/2022 - limited mobility in R arm so planning for L arm graft but had not scheduled surgery yet.   Await infxn  rx before new access / dw pt today he agrees  to get arm access now, had refused in past   Hypotension/volume: BP better, Not overtly volume overloaded by exam however sodium low/ BP soft, not on antihypertensive meds. Was given NS, given 1 liter with HD 10/02/2022 with additional IVF overnight. Continued for another 24 hours. / Bp 102/60 today/ Midodrine dose Change to 10 mg PO TID.  UF as tolerated. Will need lower EDW on discharge. 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 have increased next dose. Metabolic bone disease: CorrCa ok, Phos initially 7.5. > 3.9 Binder changed to Velphoro now at goal. Increased dose to 2 tabs PO TID AC.  Had ??? Op compliance  with binder  Nutrition - Renal diet, Alb low - continue supplements  Lenny Pastel, PA-C [facility] [phone] 10/05/2022,12:30 PM  LOS: 10 days   Labs: Basic  Metabolic Panel: Recent Labs  Lab 10/03/22 0629 10/04/22 0553 10/05/22 0531  NA 128* 128* 130*  K 4.0 4.1 3.6  CL 92* 93* 94*  CO2 23 24 26   GLUCOSE 87 107* 106*  BUN 27* 39* 27*  CREATININE 5.67* 7.44* 5.79*  CALCIUM 7.8* 8.1* 8.2*  PHOS 3.8 3.8 3.9   Liver Function Tests: Recent Labs  Lab 10/03/22 0629 10/04/22 0553 10/05/22 0531  ALBUMIN 1.6* 1.7* 1.8*   No results for input(s): "LIPASE", "AMYLASE" in the last 168 hours. No results for input(s): "AMMONIA" in the last 168 hours. CBC: Recent Labs  Lab 09/29/22 0656 09/30/22 1142 10/02/22 1234  WBC 13.1* 12.9* 13.3*  NEUTROABS 10.7* 10.6* 11.0*  HGB 7.8*  8.2* 8.5*  HCT 25.0* 26.1* 26.8*  MCV 83.3 82.3 84.0  PLT 267 283 352   Cardiac Enzymes: No results for input(s): "CKTOTAL", "CKMB", "CKMBINDEX", "TROPONINI" in the last 168 hours. CBG: Recent Labs  Lab 09/29/22 0602  GLUCAP 102*    Studies/Results: No results found. Medications:   ceFAZolin (ANCEF) IV 2 g (10/02/22 1909)    ceFAZolin (ANCEF) IV 3 g (10/04/22 2042)    (feeding supplement) PROSource Plus  30 mL Oral TID BM   benzonatate  100 mg Oral BID   calcitRIOL  0.5 mcg Oral Q T,Th,Sat-1800   Chlorhexidine Gluconate Cloth  6 each Topical Q12H   cinacalcet  30 mg Oral Q supper   darbepoetin (ARANESP) injection - DIALYSIS  100 mcg Subcutaneous Q Thu-1800   feeding supplement (NEPRO CARB STEADY)  237 mL Oral TID BM   fluticasone  1 spray Each Nare Daily   guaiFENesin  600 mg Oral BID   heparin  5,000 Units Subcutaneous Q8H   loratadine  10 mg Oral Daily   methocarbamol  250 mg Oral BID   midodrine  10 mg Oral TID WC   multivitamin  1 tablet Oral QHS   pantoprazole  40 mg Oral Daily   sucroferric oxyhydroxide  1,000 mg Oral TID WC   traZODone  150 mg Oral QHS

## 2022-10-05 NOTE — Progress Notes (Addendum)
[facility] night cross cover note:   I was contacted by Rhona Raider, PA, at our inpatient rehab regarding this patient whom [facility] is following, and conveys that this patient is having progression of elevated temperature, now 100 Fahrenheit relative to previous 24-hour Tmax of 99.9 at 5 AM on 10/05/2022, preceded by 99.5 at 2 AM on 10/05/2022.  Patient remains persistently tachycardic, with heart rates in the 120s, most recent blood pressure 127/72.  He remains on 2 L nasal cannula, with oxygen saturations in the low to mid 90s.  Per these discussions, the patient underwent chest x-ray, with concern for potential aspiration pneumonia.  His current antibiotics consist of cefazolin for osteomyelitis.  Tonight, lactic acid found to be nonelevated at 1.3, with CBC showing white cell count of 14,700 relative to 13,300 on 10/03/2022. PA ordered an updated set of blood cultures to be collected this evening has ordered  updated blood cultures x 2.  Procalcitonin level currently pending.    If the concern for aspiration pneumonia and increasing temperatures, persistent tachycardia, worsening leukocytosis in spite of current antibiotic regimen, will expand to include anaerobic coverage.  Upon further review, it appears pt has been bacteremic with MSSA. Will maintain cefazolin for now, add zosyn for concern for aspiration ammonia, and will plan to further evaluate sensitivities associated with the patient's bacteremia to guide further narrowing of antibiotic spectrum.   It is noted that this patient is also on hemodialysis, with most recent dialysis occurring yesterday, with plan for next HD session to occur on Tuesday, 10/07/2022, with nephrology following in the interval. In this setting, refraining from ordering UA.      Newton Pigg, DO Hospitalist

## 2022-10-06 DIAGNOSIS — R7881 Bacteremia: Secondary | ICD-10-CM | POA: Diagnosis not present

## 2022-10-06 DIAGNOSIS — B9561 Methicillin susceptible Staphylococcus aureus infection as the cause of diseases classified elsewhere: Secondary | ICD-10-CM | POA: Diagnosis not present

## 2022-10-06 DIAGNOSIS — I120 Hypertensive chronic kidney disease with stage 5 chronic kidney disease or end stage renal disease: Secondary | ICD-10-CM | POA: Diagnosis not present

## 2022-10-06 DIAGNOSIS — M4626 Osteomyelitis of vertebra, lumbar region: Secondary | ICD-10-CM | POA: Diagnosis not present

## 2022-10-06 DIAGNOSIS — D631 Anemia in chronic kidney disease: Secondary | ICD-10-CM | POA: Diagnosis not present

## 2022-10-06 DIAGNOSIS — Z992 Dependence on renal dialysis: Secondary | ICD-10-CM | POA: Diagnosis not present

## 2022-10-06 DIAGNOSIS — N186 End stage renal disease: Secondary | ICD-10-CM | POA: Diagnosis not present

## 2022-10-06 DIAGNOSIS — N250 Renal osteodystrophy: Secondary | ICD-10-CM | POA: Diagnosis not present

## 2022-10-06 LAB — RESPIRATORY PANEL BY PCR

## 2022-10-06 LAB — CBC WITH DIFFERENTIAL/PLATELET
Abs Immature Granulocytes: 0.28 10*3/uL — ABNORMAL HIGH (ref 0.00–0.07)
Basophils Absolute: 0.1 10*3/uL (ref 0.0–0.1)
Basophils Relative: 0 %
Eosinophils Absolute: 0.2 10*3/uL (ref 0.0–0.5)
Eosinophils Relative: 1 %
HCT: 21.3 % — ABNORMAL LOW (ref 39.0–52.0)
Hemoglobin: 6.3 g/dL — CL (ref 13.0–17.0)
Immature Granulocytes: 2 %
Lymphocytes Relative: 7 %
Lymphs Abs: 1 10*3/uL (ref 0.7–4.0)
MCH: 24.7 pg — ABNORMAL LOW (ref 26.0–34.0)
MCHC: 29.6 g/dL — ABNORMAL LOW (ref 30.0–36.0)
MCV: 83.5 fL (ref 80.0–100.0)
Monocytes Absolute: 1.3 10*3/uL — ABNORMAL HIGH (ref 0.1–1.0)
Monocytes Relative: 9 %
Neutro Abs: 11.1 10*3/uL — ABNORMAL HIGH (ref 1.7–7.7)
Neutrophils Relative %: 81 %
Platelets: 321 10*3/uL (ref 150–400)
RBC: 2.55 MIL/uL — ABNORMAL LOW (ref 4.22–5.81)
RDW: 16.3 % — ABNORMAL HIGH (ref 11.5–15.5)
WBC: 13.8 10*3/uL — ABNORMAL HIGH (ref 4.0–10.5)
nRBC: 0 % (ref 0.0–0.2)

## 2022-10-06 LAB — RENAL FUNCTION PANEL
Albumin: 1.6 g/dL — ABNORMAL LOW (ref 3.5–5.0)
Anion gap: 13 (ref 5–15)
BUN: 41 mg/dL — ABNORMAL HIGH (ref 8–23)
CO2: 24 mmol/L (ref 22–32)
Calcium: 8.2 mg/dL — ABNORMAL LOW (ref 8.9–10.3)
Chloride: 92 mmol/L — ABNORMAL LOW (ref 98–111)
Creatinine, Ser: 7.89 mg/dL — ABNORMAL HIGH (ref 0.61–1.24)
GFR, Estimated: 7 mL/min — ABNORMAL LOW (ref >60)
Glucose, Bld: 100 mg/dL — ABNORMAL HIGH (ref 70–99)
Phosphorus: 4.1 mg/dL (ref 2.5–4.6)
Potassium: 3.8 mmol/L (ref 3.5–5.1)
Sodium: 129 mmol/L — ABNORMAL LOW (ref 135–145)

## 2022-10-06 LAB — C-REACTIVE PROTEIN: CRP: 20.3 mg/dL — ABNORMAL HIGH (ref <1.0)

## 2022-10-06 LAB — PREPARE RBC (CROSSMATCH)

## 2022-10-06 LAB — SARS CORONAVIRUS 2 BY RT PCR: SARS Coronavirus 2 by RT PCR: NEGATIVE

## 2022-10-06 LAB — SEDIMENTATION RATE: Sed Rate: >140 mm/hr — ABNORMAL HIGH (ref 0–16)

## 2022-10-06 MED ORDER — SODIUM CHLORIDE 0.9% IV SOLUTION: Freq: Once | INTRAVENOUS | Status: DC

## 2022-10-06 MED ORDER — GUAIFENESIN-CODEINE 100-10 MG/5ML PO SOLN
5.0000 mL | ORAL | Status: DC | PRN
  Administered 2022-10-07 – 2022-10-08 (×4): 5 mL via ORAL
  Filled 2022-10-06 (×5): qty 5

## 2022-10-06 MED ORDER — ALBUTEROL SULFATE (2.5 MG/3ML) 0.083% IN NEBU
2.5000 mg | INHALATION_SOLUTION | Freq: Four times a day (QID) | RESPIRATORY_TRACT | Status: DC
  Administered 2022-10-06 (×2): 2.5 mg via RESPIRATORY_TRACT
  Filled 2022-10-06 (×2): qty 3

## 2022-10-06 MED ORDER — ALBUTEROL SULFATE (2.5 MG/3ML) 0.083% IN NEBU
2.5000 mg | INHALATION_SOLUTION | Freq: Four times a day (QID) | RESPIRATORY_TRACT | Status: DC | PRN
  Administered 2022-10-09: 2.5 mg via RESPIRATORY_TRACT
  Filled 2022-10-06: qty 3

## 2022-10-06 MED ORDER — ALBUTEROL SULFATE (2.5 MG/3ML) 0.083% IN NEBU
2.5000 mg | INHALATION_SOLUTION | Freq: Four times a day (QID) | RESPIRATORY_TRACT | Status: DC
  Administered 2022-10-06: 2.5 mg via RESPIRATORY_TRACT
  Filled 2022-10-06: qty 3

## 2022-10-06 NOTE — Progress Notes (Signed)
Received a call from Aldine Contes from the blood bank informing this nurse that this patient would have to have more blood specimens obtained to send to [facility] for  further testing and to find a compatible unit of blood at that blood bank.     Tilden Dome, LPN

## 2022-10-06 NOTE — Progress Notes (Signed)
Speech Language Pathology Daily Session Note  Patient Details  Name: KAMIL MCHAFFIE MRN: [id number] Date of Birth: 07-Oct-1960  Today's Date: 10/06/2022 SLP Individual Time: 1346-1431 SLP Individual Time Calculation (min): 45 min  Short Term Goals: Week 2: SLP Short Term Goal 1 (Week 2): STGs = LTGs 2* ELOS  Skilled Therapeutic Interventions: Skilled therapy session focused on cognitive re-training. SLP facilitated session by providing mod I assist during functional problem solving activities. Patient completed a calendar based on dates provided and answered functional questions with 100% accuracy. Patient also identified medication errors with mod I assist to focus on one medication at a time with 100% accuracy. SLP and patient collaboratively completed memory notebook at the end of the session. Of note, the patient was drowsy throughout often requiring tactile cues to re-alert. Patient left in bed with alarm set and visitor present. Continue POC.   Pain Denies Pain.   Therapy/Group: Individual Therapy  Shereese Bonnie M.A., CF-SLP 10/06/2022, 3:40 PM

## 2022-10-06 NOTE — Progress Notes (Signed)
Physical Therapy Weekly Progress Note  Patient Details  Name: Ryan [name] MRN: [id number] Date of Birth: 02-05-1961  Beginning of progress report period: September 26, 2022 End of progress report period: October 06, 2022   Patient has met 4 of 4 short term goals.  Patient making steady progress to long term goals. Patient limited to fatigue, lethargy, SOB and decreased activity tolerance. Pt largely requires CGA- supervision for bed mobility, transfers and gait >200 ft with rollator. Plan to continue patient education and address deficits prior to discharge.   Patient continues to demonstrate the following deficits muscle weakness, decreased cardiorespiratoy endurance, and decreased postural control and decreased balance strategies and therefore will continue to benefit from skilled PT intervention to increase functional independence with mobility.  Patient progressing toward long term goals..  Continue plan of care.  PT Short Term Goals Week 1:  PT Short Term Goal 1 (Week 1): Pt will complete sit<>stand transfer with CGA and LRAD PT Short Term Goal 1 - Progress (Week 1): Met PT Short Term Goal 2 (Week 1): Pt will ambulate 200' with LRAD CGA PT Short Term Goal 2 - Progress (Week 1): Met PT Short Term Goal 3 (Week 1): Pt will complete bed mobility CGA without hospital bed features PT Short Term Goal 3 - Progress (Week 1): Met PT Short Term Goal 4 (Week 1): Pt will complete up/down 1 curb step with RW CGA PT Short Term Goal 4 - Progress (Week 1): Met Week 2:  PT Short Term Goal 1 (Week 2): STG's=LTG's due to ELOS     Therapy/Group: Individual Therapy  Truitt Leep Truitt Leep PT, DPT  10/06/2022, 5:27 AM

## 2022-10-06 NOTE — Progress Notes (Signed)
Followed up with Madison from the blood bank, still awaiting to hear back from the blood bank in [place] at this time.      Tilden Dome, LPN

## 2022-10-06 NOTE — Progress Notes (Signed)
[facility] Progress Note    Assessment/ Plan:   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 ID RX  Blood Cx 6/16 MSSA, negative Cx on 6/17, 6/19. TDC removed 6/18. Completed 72 hour line holiday. TTE concerning for endocarditis. TEE cancelled d/t high risk factors. LS MRI with possible L5-S1 discitis. Temp cath placed 6/21, but poor function requiring exchange -  Will be getting IV Cefazolin 2g q HD x 6 weeks (until 11/04/22). Deconditioning [name]: in CIR Dialysis Access: RIJ TDC removed 6/18 - s/p line holiday, then temp line placed 6/21 with recurrent non-function issues despite exchange 6/26. S/p [facility] 7/1 in IR.  SBO - on CT Ab 6/19. Now resolved, surgery team has signed off. Hyperkalemia: resolved Hyponatremia: Na= 129 this a.m. managing with HD ESRD: Usual TTS schedule - then line issues preventing HD 6/27 -7/1. Now resolved and HD 7/1, 7/2 to correct.  Next HD Tuesday 10/07/2022 Perm Access: S/p L AVF ligation in 2019. Last seen by Dr. Edilia Bo 01/2022 - limited mobility in R arm so planning for L arm graft but had not scheduled surgery yet.   Can revisit as an outpatient Hypotension/volume: on midodrine. UF as tolerated. Will need lower EDW on discharge. 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 have increased next dose. 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 Binder changed to Velphoro now at goal. Increased dose to 2 tabs PO TID AC.  Had ??? Op compliance  with binder  Nutrition - Renal diet, Alb low - continue supplements  OP Dialysis Orders: [facility] T[facility]  3:45 450/A1.5x 2K/2Ca EDW 85.7kg TDC  -Heparin 3000 units IV TIW  -No ESA -Calcitriol 1.5 MCG PO three times per week  Anthony Sar, MD [facility]   Subjective:   Patient seen and examined bedside. Continues with low grade fevers. Hgb down to 6.3 today, agree with transfusion. He denies any complaints. Reports that his hand swelling has improved  somewhat.   Objective:   BP 111/64 (BP Location: Right Arm)   Pulse (!) 107   Temp 98.1 F (36.7 C) (Oral)   Resp (!) 24   Ht 5\' 11"  (1.803 m)   Wt 77.1 kg   SpO2 95%   BMI 23.71 kg/m   Intake/Output Summary (Last 24 hours) at 10/06/2022 0959 Last data filed at 10/06/2022 7829 Gross per 24 hour  Intake 522 ml  Output --  Net 522 ml   Weight change: -0.1 kg  Physical Exam: Gen: NAD CVS: RRR Resp: CTA B/L Abd: soft Ext: b/l hand swelling (trace) Neuro: awake, alert Dialysis access: [facility] c/d/i  Imaging: DG Chest 2 View  Result Date: 10/05/2022 CLINICAL DATA:  [id number].  Fever. EXAM: CHEST - 2 VIEW COMPARISON:  Chest 10/02/2022 FINDINGS: Inpatient AP Lat at 8:49 p.m. Left IJ dialysis catheter terminates in the upper right atrium. Moderate cardiomegaly. There is interval onset of perihilar vascular congestion and mild-to-moderate interstitial edema in the mid to lower lung fields, findings consistent with CHF or fluid overload. There are increased left-greater-than-right small pleural effusions, with increased left lower lobe opacity consistent with atelectasis, consolidation or aspiration. No other focal consolidation is seen. The mediastinum is stable with aortic ectasia and tortuosity, atherosclerosis. No new osseous findings. Thoracic spondylosis. In all other respects no further changes. IMPRESSION: 1. Cardiomegaly with perihilar vascular congestion and mild-to-moderate interstitial edema consistent with CHF or fluid overload. 2. Increased left-greater-than-right pleural effusions. 3. Increased left lower lobe opacity consistent with atelectasis, consolidation or  aspiration. Electronically Signed   By: Almira Bar M.D.   On: 10/05/2022 21:03    Labs: BMET Recent Labs  Lab 10/02/22 0735 10/02/22 1234 10/03/22 0629 10/04/22 0553 10/05/22 0531 10/05/22 2023 10/06/22 0637  NA 126* 128* 128* 128* 130* 129* 129*  K 5.2* 5.3* 4.0 4.1 3.6 3.8 3.8  CL 90* 85* 92* 93* 94*  89* 92*  CO2 23 24 23 24 26 25 24   GLUCOSE 90 109* 87 107* 106* 121* 100*  BUN 61* 61* 27* 39* 27* 36* 41*  CREATININE 8.75* 9.09* 5.67* 7.44* 5.79* 6.93* 7.89*  CALCIUM 8.4* 8.9 7.8* 8.1* 8.2* 8.5* 8.2*  PHOS  --  4.5 3.8 3.8 3.9  --  4.1   CBC Recent Labs  Lab 09/30/22 1142 10/02/22 1234 10/05/22 2023 10/06/22 0637  WBC 12.9* 13.3* 14.7* 13.8*  NEUTROABS 10.6* 11.0* 12.3* 11.1*  HGB 8.2* 8.5* 7.0* 6.3*  HCT 26.1* 26.8* 22.8* 21.3*  MCV 82.3 84.0 83.2 83.5  PLT 283 352 344 321    Medications:     (feeding supplement) PROSource Plus  30 mL Oral TID BM   albuterol  2.5 mg Nebulization QID   benzonatate  100 mg Oral BID   calcitRIOL  0.5 mcg Oral Q T,Th,Sat-1800   Chlorhexidine Gluconate Cloth  6 each Topical Q12H   cinacalcet  30 mg Oral Q supper   darbepoetin (ARANESP) injection - DIALYSIS  100 mcg Subcutaneous Q Thu-1800   feeding supplement (NEPRO CARB STEADY)  237 mL Oral TID BM   fluticasone  1 spray Each Nare Daily   guaiFENesin  600 mg Oral BID   heparin  5,000 Units Subcutaneous Q8H   loratadine  10 mg Oral Daily   methocarbamol  250 mg Oral BID   midodrine  10 mg Oral TID WC   multivitamin  1 tablet Oral QHS   pantoprazole  40 mg Oral Daily   sucroferric oxyhydroxide  1,000 mg Oral TID WC   traZODone  150 mg Oral QHS      Anthony Sar, MD [facility] 10/06/2022, 9:59 AM

## 2022-10-06 NOTE — Progress Notes (Signed)
Physical Therapy Session Note  Patient Details  Name: Ryan [name] MRN: [id number] Date of Birth: 14-Feb-1961  Today's Date: 10/06/2022 PT Missed Time: 45 Minutes Missed Time Reason: Patient ill (Comment) (lethargic)  Short Term Goals: Week 2:  PT Short Term Goal 1 (Week 2): STG's=LTG's due to ELOS  Skilled Therapeutic Interventions/Progress Updates:      Therapy Documentation Precautions:  Precautions Precautions: Fall Restrictions Weight Bearing Restrictions: No General: PT Amount of Missed Time (min): 45 Minutes PT Missed Treatment Reason: Patient ill (Comment) (lethargic)  Pt semi-reclined in bed sleeping on PT arrival. Pt with yellow sign on door and PT checked in with nurse. Nurse messaged MD who cleared pt for bed level therapy session. PT returned and assessed pulse (104) and SPO2: 92% with accessory breathing. Pt unable to remain awake and frequently fell asleep. PT discussed with PA who verbally stated okay to hold therapy. Pt missed total of 45 minutes of skilled PT, plan to make up as able.    Therapy/Group: Individual Therapy  Truitt Leep Truitt Leep PT, DPT  10/06/2022, 12:38 PM

## 2022-10-06 NOTE — Progress Notes (Signed)
PROGRESS NOTE    BARLOW HARRISON  EAV:[id number] DOB: [birth year] DOA: 09/25/2022 PCP: Sharin Grave, MD     Brief Narrative:  DENZELL COLASANTI is a 62 y.o. male with past medical history of hypertension, hyperlipidemia, and ESRD on hemodialysis who initially presented to the hospital on 6/6 with complaints of low back pain ultimately found to have evidence of L5/S1 discitis and osteomyelitis as well as MSSA bacteremia with mitral valve endocarditis.  CT scan of the chest noted septic emboli with new airspace opacities in the left upper lobe . MRI brain noting scattered subcentimeter infarcts involving the bilateral cerebral hemisphere and right cerebellum.  CT surgery has been consulted but recommended conservative management as.  He was already responding to antibiotics.  Plan was for patient to continue on antibiotics at cefazolin until 8/13 per ID recommendations.  Hospital course had also been complicated by small bowel obstruction which has since resolved.  Patient had been discharged to inpatient rehab on 7/4.  Patient reports that he has had an intermittent cough that is mostly nonproductive that started during his hospitalization.  Reports cough seems to worsen with talking.  He has intermittently gotten choked up with certain foods.  His last bowel movement was approximately 2 days ago.  Chest x-ray noted poor inspiratory effort with no acute abnormality noted.  Labs from today note WBC 13.3, hemoglobin 8.5, sodium 126, potassium 5.2, BUN 61, creatinine 8.75, and BNP 355.   New events last 24 hours / Subjective: Had episode of elevated temperature 100 F yesterday with tachycardia.  Also with shortness of breath, chest x-ray showed potential for aspiration pneumonia, Zosyn was added.  This morning, getting breathing treatment, still remains some shortness of breath.  Assessment & Plan:   Principal Problem:   Acute osteomyelitis of lumbar spine (HCC) Active Problems:   Leukocytosis    Cough   ESRD on dialysis (HCC)   Hyperkalemia   Diskitis   Acute hypoxemic respiratory failure -Requiring 2 L nasal cannula O2 -Question possible aspiration pneumonia/pneumonitis -COVID, respiratory viral panel negative -Zosyn  Anemia of chronic disease -Getting 1 unit packed red blood cell today  Tachycardia -Question if he had A-fib during dialysis 7/13  -EKG reviewed independently shows sinus tachycardia with rate 105  Other medical issues including: L5/S1 discitis and osteomyelitis, MSSA bacteremia with mitral valve endocarditis, septic emboli to the lungs, septic emboli to the brain Persistent leukocytosis in setting of infection as above ESRD on dialysis Anemia of chronic disease Small bowel obstruction, resolved -Patient remains on cefazolin until 8/13 -Dialysis per nephrology -Continue rehab efforts   TRH will continue to follow   Antimicrobials:  Anti-infectives (From admission, onward)    Start     Dose/Rate Route Frequency Ordered Stop   10/05/22 2315  piperacillin-tazobactam (ZOSYN) IVPB 2.25 g        2.25 g 100 mL/hr over 30 Minutes Intravenous Every 8 hours 10/05/22 2229     09/30/22 1800  ceFAZolin (ANCEF) IVPB 2g/100 mL premix  Status:  Discontinued       Note to Pharmacy: can we give 3gm of ancef tomorrow instead of 2gm? (Only for satursdays)   2 g 200 mL/hr over 30 Minutes Intravenous Once per day on Tue Thu 09/26/22 1107 10/05/22 2229   09/27/22 1800  ceFAZolin (ANCEF) IVPB 2g/100 mL premix  Status:  Discontinued       Note to Pharmacy: can we give 3gm of ancef tomorrow instead of 2gm? (Only for satursdays)   2  g 200 mL/hr over 30 Minutes Intravenous Every T-Th-Sa (1800) 09/26/22 0809 09/26/22 1107   09/27/22 1800  ceFAZolin (ANCEF) IVPB 3g/100 mL premix  Status:  Discontinued        3 g 200 mL/hr over 30 Minutes Intravenous Once per day on Sat 09/26/22 1107 10/05/22 2229   09/25/22 1800  ceFAZolin (ANCEF) IVPB 2g/100 mL premix  Status:   Discontinued        2 g 200 mL/hr over 30 Minutes Intravenous Every T-Th-Sa (1800) 09/25/22 1647 09/26/22 0809        Objective: Vitals:   10/06/22 0451 10/06/22 0518 10/06/22 0911 10/06/22 0924  BP: 105/62   111/64  Pulse: (!) 103  (!) 113 (!) 107  Resp: 18  (!) 21 (!) 24  Temp: 98.9 F (37.2 C)   98.1 F (36.7 C)  TempSrc: Oral   Oral  SpO2: 93%  95% 95%  Weight:  77.1 kg    Height:        Intake/Output Summary (Last 24 hours) at 10/06/2022 1223 Last data filed at 10/06/2022 0307 Gross per 24 hour  Intake 522 ml  Output --  Net 522 ml   Filed Weights   10/04/22 0515 10/05/22 0515 10/06/22 0518  Weight: 77.3 kg 77.2 kg 77.1 kg    Examination:  General exam: Appears calm and comfortable  Respiratory system: Minimal crackles left base, respiratory effort is mildly increased but without accessory muscle use.  Tachypneic but without conversational dyspnea.  On nasal cannula O2 Cardiovascular system: S1 & S2 heard, tachycardic, regular rhythm. No murmurs. No pedal edema. Gastrointestinal system: Abdomen is nondistended, soft and nontender. Normal bowel sounds heard. Central nervous system: Alert and oriented. No focal neurological deficits. Speech clear.  Extremities: Symmetric in appearance  Skin: No rashes, lesions or ulcers on exposed skin  Psychiatry: Judgement and insight appear normal. Mood & affect appropriate.   Data Reviewed: I have personally reviewed following labs and imaging studies  CBC: Recent Labs  Lab 09/30/22 1142 10/02/22 1234 10/05/22 2023 10/06/22 0637  WBC 12.9* 13.3* 14.7* 13.8*  NEUTROABS 10.6* 11.0* 12.3* 11.1*  HGB 8.2* 8.5* 7.0* 6.3*  HCT 26.1* 26.8* 22.8* 21.3*  MCV 82.3 84.0 83.2 83.5  PLT 283 352 344 321   Basic Metabolic Panel: Recent Labs  Lab 10/02/22 1234 10/03/22 0629 10/04/22 0553 10/05/22 0531 10/05/22 2023 10/06/22 0637  NA 128* 128* 128* 130* 129* 129*  K 5.3* 4.0 4.1 3.6 3.8 3.8  CL 85* 92* 93* 94* 89* 92*  CO2  24 23 24 26 25 24   GLUCOSE 109* 87 107* 106* 121* 100*  BUN 61* 27* 39* 27* 36* 41*  CREATININE 9.09* 5.67* 7.44* 5.79* 6.93* 7.89*  CALCIUM 8.9 7.8* 8.1* 8.2* 8.5* 8.2*  PHOS 4.5 3.8 3.8 3.9  --  4.1   GFR: Estimated Creatinine Clearance: 10.5 mL/min (A) (by C-G formula based on SCr of 7.89 mg/dL (H)). Liver Function Tests: Recent Labs  Lab 10/02/22 1234 10/03/22 0629 10/04/22 0553 10/05/22 0531 10/06/22 0637  ALBUMIN 2.1* 1.6* 1.7* 1.8* 1.6*   No results for input(s): "LIPASE", "AMYLASE" in the last 168 hours. No results for input(s): "AMMONIA" in the last 168 hours. Coagulation Profile: No results for input(s): "INR", "PROTIME" in the last 168 hours. Cardiac Enzymes: No results for input(s): "CKTOTAL", "CKMB", "CKMBINDEX", "TROPONINI" in the last 168 hours. BNP (last 3 results) No results for input(s): "PROBNP" in the last 8760 hours. HbA1C: No results for input(s): "HGBA1C" in the  last 72 hours. CBG: No results for input(s): "GLUCAP" in the last 168 hours.  Lipid Profile: No results for input(s): "CHOL", "HDL", "LDLCALC", "TRIG", "CHOLHDL", "LDLDIRECT" in the last 72 hours. Thyroid Function Tests: No results for input(s): "TSH", "T4TOTAL", "FREET4", "T3FREE", "THYROIDAB" in the last 72 hours. Anemia Panel: No results for input(s): "VITAMINB12", "FOLATE", "FERRITIN", "TIBC", "IRON", "RETICCTPCT" in the last 72 hours. Sepsis Labs: Recent Labs  Lab 10/02/22 1234 10/05/22 2023 10/05/22 2257  PROCALCITON 5.26 5.04  --   LATICACIDVEN  --  1.3 1.0    Recent Results (from the past 240 hour(s))  Culture, blood (Routine X 2) w Reflex to ID Panel     Status: None (Preliminary result)   Collection Time: 10/05/22  8:23 PM   Specimen: BLOOD  Result Value Ref Range Status   Specimen Description BLOOD BLOOD RIGHT HAND  Final   Special Requests   Final    BOTTLES DRAWN AEROBIC AND ANAEROBIC Blood Culture adequate volume   Culture   Final    NO GROWTH < 12 HOURS Performed  at [facility], [address], [place], [place] [address]    Report Status PENDING  Incomplete  Culture, blood (Routine X 2) w Reflex to ID Panel     Status: None (Preliminary result)   Collection Time: 10/05/22  8:32 PM   Specimen: BLOOD  Result Value Ref Range Status   Specimen Description BLOOD BLOOD RIGHT ARM  Final   Special Requests   Final    BOTTLES DRAWN AEROBIC AND ANAEROBIC Blood Culture adequate volume   Culture   Final    NO GROWTH < 12 HOURS Performed at [facility], [address] [place], [place] [address]    Report Status PENDING  Incomplete  Respiratory (~20 pathogens) panel by PCR     Status: None   Collection Time: 10/06/22  1:58 AM   Specimen: Nasopharyngeal Swab; Respiratory  Result Value Ref Range Status   Adenovirus NOT DETECTED NOT DETECTED Final   Coronavirus 229E NOT DETECTED NOT DETECTED Final    Comment: (NOTE) The Coronavirus on the Respiratory Panel, DOES NOT test for the novel  Coronavirus (2019 nCoV)    Coronavirus HKU1 NOT DETECTED NOT DETECTED Final   Coronavirus NL63 NOT DETECTED NOT DETECTED Final   Coronavirus OC43 NOT DETECTED NOT DETECTED Final   Metapneumovirus NOT DETECTED NOT DETECTED Final   Rhinovirus / Enterovirus NOT DETECTED NOT DETECTED Final   Influenza A NOT DETECTED NOT DETECTED Final   Influenza B NOT DETECTED NOT DETECTED Final   Parainfluenza Virus 1 NOT DETECTED NOT DETECTED Final   Parainfluenza Virus 2 NOT DETECTED NOT DETECTED Final   Parainfluenza Virus 3 NOT DETECTED NOT DETECTED Final   Parainfluenza Virus 4 NOT DETECTED NOT DETECTED Final   Respiratory Syncytial Virus NOT DETECTED NOT DETECTED Final   Bordetella pertussis NOT DETECTED NOT DETECTED Final   Bordetella Parapertussis NOT DETECTED NOT DETECTED Final   Chlamydophila pneumoniae NOT DETECTED NOT DETECTED Final   Mycoplasma pneumoniae NOT DETECTED NOT DETECTED Final    Comment: Performed at [facility], [address],  [place], [place] [address]  SARS Coronavirus 2 by RT PCR (hospital order, performed in [facility] hospital lab) *cepheid single result test* Anterior Nasal Swab     Status: None   Collection Time: 10/06/22  1:58 AM   Specimen: Anterior Nasal Swab  Result Value Ref Range Status   SARS Coronavirus 2 by RT PCR  NEGATIVE NEGATIVE Final    Comment: Performed at [facility], [address] [place], [place] [address]      Radiology Studies: DG Chest 2 View  Result Date: 10/05/2022 CLINICAL DATA:  [id number].  Fever. EXAM: CHEST - 2 VIEW COMPARISON:  Chest 10/02/2022 FINDINGS: Inpatient AP Lat at 8:49 p.m. Left IJ dialysis catheter terminates in the upper right atrium. Moderate cardiomegaly. There is interval onset of perihilar vascular congestion and mild-to-moderate interstitial edema in the mid to lower lung fields, findings consistent with CHF or fluid overload. There are increased left-greater-than-right small pleural effusions, with increased left lower lobe opacity consistent with atelectasis, consolidation or aspiration. No other focal consolidation is seen. The mediastinum is stable with aortic ectasia and tortuosity, atherosclerosis. No new osseous findings. Thoracic spondylosis. In all other respects no further changes. IMPRESSION: 1. Cardiomegaly with perihilar vascular congestion and mild-to-moderate interstitial edema consistent with CHF or fluid overload. 2. Increased left-greater-than-right pleural effusions. 3. Increased left lower lobe opacity consistent with atelectasis, consolidation or aspiration. Electronically Signed   By: Almira Bar M.D.   On: 10/05/2022 21:03      Scheduled Meds:  (feeding supplement) PROSource Plus  30 mL Oral TID BM   sodium chloride   Intravenous Once   albuterol  2.5 mg Nebulization Q6H   benzonatate  100 mg Oral BID   calcitRIOL  0.5 mcg Oral Q T,Th,Sat-1800   Chlorhexidine Gluconate Cloth  6 each Topical Q12H   cinacalcet  30 mg Oral Q supper    darbepoetin (ARANESP) injection - DIALYSIS  100 mcg Subcutaneous Q Thu-1800   feeding supplement (NEPRO CARB STEADY)  237 mL Oral TID BM   fluticasone  1 spray Each Nare Daily   guaiFENesin  600 mg Oral BID   heparin  5,000 Units Subcutaneous Q8H   loratadine  10 mg Oral Daily   methocarbamol  250 mg Oral BID   midodrine  10 mg Oral TID WC   multivitamin  1 tablet Oral QHS   pantoprazole  40 mg Oral Daily   sucroferric oxyhydroxide  1,000 mg Oral TID WC   traZODone  150 mg Oral QHS   Continuous Infusions:  piperacillin-tazobactam (ZOSYN)  IV 2.25 g (10/06/22 0521)     LOS: 11 days   Time spent: 25 minutes   Noralee Stain, DO [facility] 10/06/2022, 12:23 PM   Available via Epic secure chat 7am-7pm After these hours, please refer to coverage provider listed on [email or web address]

## 2022-10-06 NOTE — Progress Notes (Signed)
Occupational Therapy Session Note  Patient Details  Name: Ryan [name] MRN: [id number] Date of Birth: 1960/04/19  Today's Date: 10/06/2022 OT Individual Time: 0825-0900 OT Individual Time Calculation (min): 35 min  and Today's Date: 10/06/2022 OT Missed Time: -25 Minutes Missed Time Reason: Nursing care (doctors rounds and IV line change in arm)   Short Term Goals: Week 2:  OT Short Term Goal 1 (Week 2): STG=LTG d/t ELOS  Skilled Therapeutic Interventions/Progress Updates:      Therapy Documentation Precautions:  Precautions Precautions: Fall Restrictions Weight Bearing Restrictions: No General:  "Hi [name]!" Pt supine in bed upon OT arrival, agreeable to OT session. 2L O2 donned during session. Pt with increasing lethargy and SOB this session. Nursing notified, doctor aware from completing rounds this AM during session.   Pain: 0/10 pain reported/noted  Vitals: 92% and 111 bpm seated EOB   ADL: Bed mobility: SBA supine>sit with HOB raised  UB dressing: Min A, front opening shirt, assistance with bringing around back to don on other arm  LB dressing: SBA with use of reacher to don over feet, SBA to doff/don over hips in standing Footwear: SBA with sock aide for grip socks, total A for TED hose Transfers: SBA for sit to stands in order to doff/don pants over hips   Pt required increased rest breaks this session d/t SOB and fatigue/lethargy.    Other Treatments:  Pt educated on pursed lip breathing for increased O2 levels. Pt educated on importance of breathing through nose in order to gain benefit of wearing O2 nasal cannula.    Pt supine in bed with bed alarm activated, 2 bed rails up, call light within reach and 4Ps assessed. Direct handoff to nursing for care.    Therapy/Group: Individual Therapy  Velia Meyer, OTD, OTR/L 10/06/2022, 4:11 PM

## 2022-10-06 NOTE — Progress Notes (Signed)
PROGRESS NOTE   Subjective/Complaints:  Pt reports he doesn't feel like himself this AM- feels "weird".  Also coughing hurts his ribs/chest and throat so bad, he "cannot stand it".   Admits he's "not himself".   HB down to 6.3 from 7.0 from 8.5- ordered FOBT- and transfusion. Concerning dropped so fast- no bloody stools per staff.     Was dx'd with aspiration pneumonia- all testing came back negative for all 20 resp pathogens-   Also notes having R>L hand swelling- cannot close R hand due to swelling- can close L hand.  Has IV in R hand- d/w IV nurse- since need them to change dressing and to put IV, she says she will get it switched.   Also d/w charge nurse about IV, need for transfusion and also with pt's nurse about Hb 6.3.   ROS:   Pt denies SOB since on 2-3L O2- feels ok, abd pain, CP, N/V/C/D, and vision changes But doesn't "feel like himself".   Except for HPI  Objective:   DG Chest 2 View  Result Date: 10/05/2022 CLINICAL DATA:  [id number].  Fever. EXAM: CHEST - 2 VIEW COMPARISON:  Chest 10/02/2022 FINDINGS: Inpatient AP Lat at 8:49 p.m. Left IJ dialysis catheter terminates in the upper right atrium. Moderate cardiomegaly. There is interval onset of perihilar vascular congestion and mild-to-moderate interstitial edema in the mid to lower lung fields, findings consistent with CHF or fluid overload. There are increased left-greater-than-right small pleural effusions, with increased left lower lobe opacity consistent with atelectasis, consolidation or aspiration. No other focal consolidation is seen. The mediastinum is stable with aortic ectasia and tortuosity, atherosclerosis. No new osseous findings. Thoracic spondylosis. In all other respects no further changes. IMPRESSION: 1. Cardiomegaly with perihilar vascular congestion and mild-to-moderate interstitial edema consistent with CHF or fluid overload. 2. Increased  left-greater-than-right pleural effusions. 3. Increased left lower lobe opacity consistent with atelectasis, consolidation or aspiration. Electronically Signed   By: Almira Bar M.D.   On: 10/05/2022 21:03   Recent Labs    10/05/22 2023 10/06/22 0637  WBC 14.7* 13.8*  HGB 7.0* 6.3*  HCT 22.8* 21.3*  PLT 344 321   Recent Labs    10/05/22 2023 10/06/22 0637  NA 129* 129*  K 3.8 3.8  CL 89* 92*  CO2 25 24  GLUCOSE 121* 100*  BUN 36* 41*  CREATININE 6.93* 7.89*  CALCIUM 8.5* 8.2*    Intake/Output Summary (Last 24 hours) at 10/06/2022 0955 Last data filed at 10/06/2022 0307 Gross per 24 hour  Intake 522 ml  Output --  Net 522 ml         Physical Exam: Vital Signs Blood pressure 111/64, pulse (!) 107, temperature 98.1 F (36.7 C), temperature source Oral, resp. rate (!) 24, height 5\' 11"  (1.803 m), weight 77.1 kg, SpO2 95%.    General: sleepy- sitting up in bed; denies dizziness-  OT joined [place]- just not himself; on 2-3L O2; NAD HENT: conjugate gaze; oropharynx dry CV: regular rhythm but tachycardic rate; no JVD Pulmonary: no wheezing, but decreased throughout-  GI: soft, NT, ND, (+)BS- slightly hypoactive Psychiatric: appropriate- but more flat- than normal -also appears somewhat sleepy  Neurological: vague- appears slightly confused- long pauses before answering-  Skin- R>>L hand swelling- likely on R due to IV infiltration- d/w IV nurse PRIOR EXAMS: Skin: + L upper chest HD catheter - dressing c/d/i + Nonpainful erythematous lesions on bilateral feet  MSK:      No apparent deformity.      Strength: Exam limited d/t being on HD; all 4 limbs equal, antigravity and against resistance   Neurologic exam:  Cognition: AAO to person, place, time and event.  + Mild lethargy - intermittently falling asleep during late dialysis; more awake in early exam Language: Fluent, No substitutions or neoglisms. No dysarthria. Names 3/3 objects correctly.  Memory: Recalls 3/3  objects at 5 minutes. No apparent deficits  Insight: Good  insight into current condition.  Mood: Pleasant affect, appropriate mood.  Sensation: To light touch intact in BL UEs and LEs  Reflexes: 1+ in BL UE and LEs. Negative Hoffman's and babinski signs bilaterally.  CN: 2-12 grossly intact.  Coordination: No apparent tremors. No ataxia on FTN, HTS bilaterally.  Spasticity: MAS 0 in all extremities.       Assessment/Plan: 1. Functional deficits which require 3+ hours per day of interdisciplinary therapy in a comprehensive inpatient rehab setting. Physiatrist is providing close team supervision and 24 hour management of active medical problems listed below. Physiatrist and rehab team continue to assess barriers to discharge/monitor patient progress toward functional and medical goals  Care Tool:  Bathing    Body parts bathed by patient: Right arm, Left arm, Chest, Abdomen, Front perineal area, Buttocks, Right upper leg, Left upper leg, Right lower leg, Left lower leg, Face         Bathing assist Assist Level: Set up assist     Upper Body Dressing/Undressing Upper body dressing   What is the patient wearing?: Button up shirt    Upper body assist Assist Level: Minimal Assistance - Patient > 75%    Lower Body Dressing/Undressing Lower body dressing      What is the patient wearing?: Underwear/pull up     Lower body assist Assist for lower body dressing: Minimal Assistance - Patient > 75%     Toileting Toileting    Toileting assist Assist for toileting: Supervision/Verbal cueing     Transfers Chair/bed transfer  Transfers assist     Chair/bed transfer assist level: Supervision/Verbal cueing     Locomotion Ambulation   Ambulation assist      Assist level: Minimal Assistance - Patient > 75% Assistive device: Rollator Max distance: 170'   Walk 10 feet activity   Assist     Assist level: Minimal Assistance - Patient > 75% Assistive device:  Walker-rolling   Walk 50 feet activity   Assist    Assist level: Minimal Assistance - Patient > 75% Assistive device: Walker-rolling    Walk 150 feet activity   Assist    Assist level: Minimal Assistance - Patient > 75% Assistive device: Walker-rolling    Walk 10 feet on uneven surface  activity   Assist Walk 10 feet on uneven surfaces activity did not occur: Safety/medical concerns         Wheelchair     Assist Is the patient using a wheelchair?: Yes Type of Wheelchair: Manual    Wheelchair assist level: Dependent - Patient 0% (pain limits)      Wheelchair 50 feet with 2 turns activity    Assist        Assist Level: Dependent - Patient 0%  Wheelchair 150 feet activity     Assist      Assist Level: Dependent - Patient 0%   Blood pressure 111/64, pulse (!) 107, temperature 98.1 F (36.7 C), temperature source Oral, resp. rate (!) 24, height 5\' 11"  (1.803 m), weight 77.1 kg, SpO2 95%.  Medical Problem List and Plan: 1. 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  Antithrombotics: -DVT/anticoagulation:  Pharmaceutical: Heparin 5000 q8h             -antiplatelet therapy: N/A 3. Low back pain/Pain Management: tylenol, Oxycodone prn. Advised to use oxycodone prior to sleep --will schedule low dose robaxin 250mg  bid as reports ongoing lower back and LE spasms.  -7/5- will increase Oxy to 10-15 mg q4 hours prn-can add long acting over weekend if necessary -7/6-7/24 pain seems controlled for now, monitor 7/12- pain doing much better at 7.5-10 mg q4 hours prn -10/05/22 pain controlled overall 4. Mood/Behavior/Sleep: LCSW to follow for evaluation and support.               -antipsychotic agents: N/A --acute on chronic insomnia in part due to anxiety. Will schedule low dose trazodone. Continue prn melatonin -7/5- will change trazodone to 75 mg at bedtime- and can be titrated more over weekend if needed.  7/9- sleeping well  7/10- didn't sleep well- will increase trazodone to 150 mg at bedtime 7/12- slept somewhat better- con't trazodone -10/05/22 sleeping better 5. Neuropsych/cognition: This patient is capable of making decisions on his own behalf. 6. Skin/Wound Care: Routine pressure relief measures -continue to monitor new lesions on bilateral feet for evolution/progress   7. Fluids/Electrolytes/Nutrition: Strict I/O. Daily weights. Labs TTS with HD. Add low salt restrictions             --continue Nephro supplements 8. 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  --IV cefazolin with HD. EOT 11/04/22              --Fevers have resolved. CRP-44/Sed rate 1.5 @ admission.  --Leucocytosis trending down from 23.9-->31.4-->13.9.    -7/5- Pt's WBC down to 13/9- will recheck labs in AM as well as qmonday -09/27/22 WBC 13.2, cont to monitor 7/8- WBC stable at 13k- CRP 17.3 and ESR >140- will call ID- they said ESR not real helpful in ESRD pt's- so to make sure CRP improving and pt looks good, to call them back before pt leaves.  Called, since ESR rising- last checked 3 weeks ago and WBC won't resolve. 7/9- ID ok with labs since ESR not a good indicator in ESRD- follow labs weekly.   7/12- will call ID before leaves rehab 7/15- CRP slightly up to 20 and ESR down to 100 9. SBO w/ enteritis: Has resolved. Surgery team signed off 10. ESRD: HD TTS at the end of the day to help with tolerance of therapy.              --strict I/O with daily weights. Continues to require 4 L oxygen per [facility].  --On Liberalized diet but 1200 cc FR/4 gram salt.  Velphoro for  elevated phos level. -7/5- Na down to 128- per Renal  -09/27/22 dialysis today 7/9- K+_ 5.3- this AM and Na 127- per renal to intervene- they will likely check labs and follow up 7/10- K+ up, but then held HD yesterday. Na 126-  7/11- Na 126 and K_ 5.2 still- also d/w pt with renal about midodrine- per renal.  7/12- Na up to 128-K+ 4.0 7/15- Na 129- K+ 3.8 11. Anemia of chronic disease: On Aranesp weekly every Thursday w/HD.   7/12- Hb 8.5- better than was of 7.8-8.2  7/15- Hb down to 6.3- concern for GI bleed 12. HTN: Monitor BP TID. On low dose tenormin 12.5mg  every day but BP with drops during HD.  -7/5- BP running low 90s systolic when laying down- I'm concerned will have orthostatic hypotension-will check with PT/OT  -09/27/22 BPs soft, monitor closely -09/28/22 BP a little softer today, hold atenolol this morning, monitor to see if changes need to be made to that. Denies orthostatic symptoms.  7/8- held atenolol- BP dropped to 60-70's systolic yesterday- was 90s SBP this AM- will monitor- midodrine increased yesterday to 2.5 mg 7/9- will hold atenolol since BP 104 systolic and going to HD today.   7/10- stopped Atenolol and increased Midodrine to 5 mg BID with breakfast/lunch 7/11- increased midodrine to 5 mg TID- but think he might benefit from 10 mg since BP has been running 70s with sitting/standing with therapy.  7/12- BP running 80's this AM- Midodrine up 5-10 mg TID- depending on HD days- asymptomatic as usual- will d/w therapy again.  7/15- BP low, but likely due to ABLA Vitals:   10/04/22 1800 10/04/22 1852 10/04/22 1928 10/04/22 2133  BP: 130/74 120/76 127/67 120/62   10/04/22 2354 10/05/22 0147 10/05/22 0504 10/05/22 1926  BP: (!) 112/59 94/68 102/60 127/72   10/05/22 2236 10/06/22 0142 10/06/22 0451 10/06/22 0924  BP: 122/72 111/63 105/62 111/64    13. AHRF d/t cavitary pneumonia. Wean oxygen as able. Currently 2-4L; per patient not using CPAP/BiPAP   -7/5- will wean O2 as  allowed  -09/27/22 no O2 on today, monitor  -10/04/22 needing O2 last night, doing well today, monitor 14. Constipation:  -09/28/22 had a small BM yesterday, lots of gas, will do dose of miralax now then add on QD PRN dose, advised to take senna with the miralax dose now, then could use the PRN miralax dose after lunch if still not successful-- if not working, then use suppository +/- enema.  7/8- LBM  7/6- small- feels like needs to go- wants to wait on more intervention.   7/9- Had good sized BM yesterday- feeling better  7/10- bowels "going ok".   -10/05/22 LBM this morning, doing well, monitor 15. New cough/mild SOB  7/11- got CXR_ concern about fluid overload/heart decline in working? Due to enlarged cardiac silhouette and appears like effusion/possible infiltrate- Called IM consult to address- CXR read pending.   7/12- CXR shows more atelectasis- will order flutter valve and tessalon pearls 100 mg BID for cough. Pt reports still bothersome   -10/04/22 cough better, needing O2 today but feels ok; hopefully dialysis today will help with volume; monitor  -10/05/22 no O2 today, somewhat shallow breathing but not really increased WOB; monitor closely  7/15- will order albuterol nebs scheduled for 2 days then PRN and cough meds with codeine.  16. Severe protein Malnutrition  7/12- Alb down to 1.6- will d/w team about what to add for appetite stimulation- thinking Remeron?will order dietitian  consult.   17. Tachycardia:  -10/05/22 tachycardic since dialysis yesterday, but completely asymptomatic-- maybe from midodrine? Recently increased. Monitor closely, doubt need for urgent intervention or work up right now  7/15- likely due to low Hb.    I spent a total of  56  minutes on total care today- >50% coordination of care- due to  D/w PA and nursing about Hb of 6.3- also d/w OT_ because pt not acting quite right-  Als review of IM note- will recheck labs in AM  LOS: 11 days A FACE TO FACE EVALUATION  WAS PERFORMED  Jairy Angulo 10/06/2022, 9:55 AM

## 2022-10-06 NOTE — Plan of Care (Signed)
  Problem: Consults Goal: RH GENERAL PATIENT EDUCATION Description: See Patient Education module for education specifics. 10/06/2022 1801 by Elgie Congo, LPN Outcome: Progressing 10/06/2022 1758 by Elgie Congo, LPN Outcome: Progressing 10/06/2022 1704 by Elgie Congo, LPN Outcome: Progressing   Problem: RH BOWEL ELIMINATION Goal: RH STG MANAGE BOWEL WITH ASSISTANCE Description: STG Manage Bowel with min Assistance. Outcome: Progressing   Problem: RH BLADDER ELIMINATION Goal: RH STG MANAGE BLADDER WITH ASSISTANCE Description: STG Manage Bladder With min Assistance 10/06/2022 1801 by Elgie Congo, LPN Outcome: Progressing 10/06/2022 1758 by Elgie Congo, LPN Outcome: Progressing   Problem: Education: Goal: Knowledge of General Education information will improve Description: Including pain rating scale, medication(s)/side effects and non-pharmacologic comfort measures 10/06/2022 1801 by Elgie Congo, LPN Outcome: Progressing 10/06/2022 1758 by Elgie Congo, LPN Outcome: Progressing   Problem: Health Behavior/Discharge Planning: Goal: Ability to manage health-related needs will improve Outcome: Progressing   Problem: Clinical Measurements: Goal: Ability to maintain clinical measurements within normal limits will improve Outcome: Progressing Goal: Will remain free from infection Outcome: Progressing Goal: Diagnostic test results will improve Outcome: Progressing Goal: Respiratory complications will improve Outcome: Progressing Goal: Cardiovascular complication will be avoided Outcome: Progressing   Problem: Activity: Goal: Risk for activity intolerance will decrease Outcome: Progressing   Problem: Nutrition: Goal: Adequate nutrition will be maintained 10/06/2022 1801 by Elgie Congo, LPN Outcome: Progressing 10/06/2022 1758 by Elgie Congo, LPN Outcome: Progressing   Problem: Coping: Goal: Level of anxiety will decrease Outcome: Progressing    Problem: Elimination: Goal: Will not experience complications related to bowel motility Outcome: Progressing Goal: Will not experience complications related to urinary retention Outcome: Progressing   Problem: Pain Managment: Goal: General experience of comfort will improve Outcome: Progressing   Problem: Safety: Goal: Ability to remain free from injury will improve Outcome: Progressing   Problem: Skin Integrity: Goal: Risk for impaired skin integrity will decrease Outcome: Progressing

## 2022-10-06 NOTE — Progress Notes (Signed)
Notfied Dr. Berline Chough and Deatra Ina, PA of crictical hemoglobin level. New orders placed.     Tilden Dome, LPN

## 2022-10-06 NOTE — Plan of Care (Signed)
  Problem: Consults Goal: RH GENERAL PATIENT EDUCATION Description: See Patient Education module for education specifics. 10/06/2022 1758 by Elgie Congo, LPN Outcome: Progressing 10/06/2022 1704 by Elgie Congo, LPN Outcome: Progressing   Problem: RH BLADDER ELIMINATION Goal: RH STG MANAGE BLADDER WITH ASSISTANCE Description: STG Manage Bladder With min Assistance Outcome: Progressing   Problem: Education: Goal: Knowledge of General Education information will improve Description: Including pain rating scale, medication(s)/side effects and non-pharmacologic comfort measures Outcome: Progressing   Problem: Nutrition: Goal: Adequate nutrition will be maintained Outcome: Progressing

## 2022-10-07 DIAGNOSIS — M4626 Osteomyelitis of vertebra, lumbar region: Secondary | ICD-10-CM | POA: Diagnosis not present

## 2022-10-07 LAB — CBC WITH DIFFERENTIAL/PLATELET
Abs Immature Granulocytes: 0.27 10*3/uL — ABNORMAL HIGH (ref 0.00–0.07)
Basophils Absolute: 0.1 10*3/uL (ref 0.0–0.1)
Basophils Relative: 0 %
Eosinophils Absolute: 0.2 10*3/uL (ref 0.0–0.5)
Eosinophils Relative: 2 %
HCT: 20 % — ABNORMAL LOW (ref 39.0–52.0)
Hemoglobin: 6.1 g/dL — CL (ref 13.0–17.0)
Immature Granulocytes: 2 %
Lymphocytes Relative: 8 %
Lymphs Abs: 1 10*3/uL (ref 0.7–4.0)
MCH: 25.2 pg — ABNORMAL LOW (ref 26.0–34.0)
MCHC: 30.5 g/dL (ref 30.0–36.0)
MCV: 82.6 fL (ref 80.0–100.0)
Monocytes Absolute: 1 10*3/uL (ref 0.1–1.0)
Monocytes Relative: 8 %
Neutro Abs: 10.3 10*3/uL — ABNORMAL HIGH (ref 1.7–7.7)
Neutrophils Relative %: 80 %
Platelets: 358 10*3/uL (ref 150–400)
RBC: 2.42 MIL/uL — ABNORMAL LOW (ref 4.22–5.81)
RDW: 16 % — ABNORMAL HIGH (ref 11.5–15.5)
WBC: 12.9 10*3/uL — ABNORMAL HIGH (ref 4.0–10.5)
nRBC: 0.2 % (ref 0.0–0.2)

## 2022-10-07 LAB — OCCULT BLOOD X 1 CARD TO LAB, STOOL: Fecal Occult Bld: POSITIVE — AB

## 2022-10-07 MED ORDER — DARBEPOETIN ALFA 150 MCG/0.3ML IJ SOSY
150.0000 ug | PREFILLED_SYRINGE | INTRAMUSCULAR | Status: DC
  Filled 2022-10-07: qty 0.3

## 2022-10-07 MED ORDER — HEPARIN SODIUM (PORCINE) 1000 UNIT/ML IJ SOLN
INTRAMUSCULAR | Status: AC
  Administered 2022-10-07: 3000 [IU]
  Filled 2022-10-07: qty 7

## 2022-10-07 MED ORDER — ALBUTEROL SULFATE (2.5 MG/3ML) 0.083% IN NEBU
2.5000 mg | INHALATION_SOLUTION | Freq: Three times a day (TID) | RESPIRATORY_TRACT | Status: DC
  Administered 2022-10-07 – 2022-10-08 (×3): 2.5 mg via RESPIRATORY_TRACT
  Filled 2022-10-07 (×3): qty 3

## 2022-10-07 MED ORDER — SORBITOL 70 % SOLN
30.0000 mL | Freq: Once | Status: AC
  Administered 2022-10-07: 30 mL via ORAL
  Filled 2022-10-07: qty 30

## 2022-10-07 NOTE — Progress Notes (Signed)
[facility] Progress Note   Subjective:   Patient seen and examined at bedside.  Reports having O2 put on last night due to low oxygen.  Feeling a little short of breath today.  Had an episode of dysphagia this AM causing the need to vomit.  Speech therapy present for our conversation and plans to discuss further.  Denies CP, nausea, abdominal pain and edema.    Objective Vitals:   10/07/22 0605 10/07/22 0729 10/07/22 0817 10/07/22 1059  BP: 94/60 (!) 100/59  (!) 92/55  Pulse: 94 94 94 97  Resp: 20 19 18 18   Temp: 98.6 F (37 C) 97.6 F (36.4 C)  97.8 F (36.6 C)  TempSrc: Oral Oral  Oral  SpO2: 97%   94%  Weight:      Height:       Physical Exam General:chronically ill appearing male in NAD Heart:RRR, no mrg Lungs:CTAB, breath sounds decreased, no crackles, nml WOB on 2L O2 Abdomen:soft, NTND Extremities:trace LE edema on R Dialysis Access: [facility]   Filed Weights   10/05/22 0515 10/06/22 0518 10/07/22 0500  Weight: 77.2 kg 77.1 kg 83.6 kg    Intake/Output Summary (Last 24 hours) at 10/07/2022 1202 Last data filed at 10/06/2022 1810 Gross per 24 hour  Intake 240 ml  Output --  Net 240 ml    Additional Objective Labs: Basic Metabolic Panel: Recent Labs  Lab 10/04/22 0553 10/05/22 0531 10/05/22 2023 10/06/22 0637  NA 128* 130* 129* 129*  K 4.1 3.6 3.8 3.8  CL 93* 94* 89* 92*  CO2 24 26 25 24   GLUCOSE 107* 106* 121* 100*  BUN 39* 27* 36* 41*  CREATININE 7.44* 5.79* 6.93* 7.89*  CALCIUM 8.1* 8.2* 8.5* 8.2*  PHOS 3.8 3.9  --  4.1   Liver Function Tests: Recent Labs  Lab 10/04/22 0553 10/05/22 0531 10/06/22 0637  ALBUMIN 1.7* 1.8* 1.6*   CBC: Recent Labs  Lab 10/02/22 1234 10/05/22 2023 10/06/22 0637 10/07/22 0548  WBC 13.3* 14.7* 13.8* 12.9*  NEUTROABS 11.0* 12.3* 11.1* 10.3*  HGB 8.5* 7.0* 6.3* 6.1*  HCT 26.8* 22.8* 21.3* 20.0*  MCV 84.0 83.2 83.5 82.6  PLT 352 344 321 358   Blood Culture    Component Value Date/Time   SDES  BLOOD BLOOD RIGHT ARM 10/05/2022 2032   SPECREQUEST  10/05/2022 2032    BOTTLES DRAWN AEROBIC AND ANAEROBIC Blood Culture adequate volume   CULT  10/05/2022 2032    NO GROWTH 2 DAYS Performed at [facility], [address] [place], [place] [address]    REPTSTATUS PENDING 10/05/2022 2032    DG Chest 2 View  Result Date: 10/05/2022 CLINICAL DATA:  [id number].  Fever. EXAM: CHEST - 2 VIEW COMPARISON:  Chest 10/02/2022 FINDINGS: Inpatient AP Lat at 8:49 p.m. Left IJ dialysis catheter terminates in the upper right atrium. Moderate cardiomegaly. There is interval onset of perihilar vascular congestion and mild-to-moderate interstitial edema in the mid to lower lung fields, findings consistent with CHF or fluid overload. There are increased left-greater-than-right small pleural effusions, with increased left lower lobe opacity consistent with atelectasis, consolidation or aspiration. No other focal consolidation is seen. The mediastinum is stable with aortic ectasia and tortuosity, atherosclerosis. No new osseous findings. Thoracic spondylosis. In all other respects no further changes. IMPRESSION: 1. Cardiomegaly with perihilar vascular congestion and mild-to-moderate interstitial edema consistent with CHF or fluid overload. 2. Increased left-greater-than-right pleural effusions. 3. Increased left lower lobe opacity consistent with atelectasis, consolidation or  aspiration. Electronically Signed   By: Almira Bar M.D.   On: 10/05/2022 21:03    Medications:  piperacillin-tazobactam (ZOSYN)  IV 2.25 g (10/07/22 0606)    (feeding supplement) PROSource Plus  30 mL Oral TID BM   sodium chloride   Intravenous Once   albuterol  2.5 mg Nebulization TID   benzonatate  100 mg Oral BID   calcitRIOL  0.5 mcg Oral Q T,Th,Sat-1800   Chlorhexidine Gluconate Cloth  6 each Topical Q12H   cinacalcet  30 mg Oral Q supper   darbepoetin (ARANESP) injection - DIALYSIS  100 mcg Subcutaneous Q Thu-1800   feeding  supplement (NEPRO CARB STEADY)  237 mL Oral TID BM   fluticasone  1 spray Each Nare Daily   guaiFENesin  600 mg Oral BID   loratadine  10 mg Oral Daily   methocarbamol  250 mg Oral BID   midodrine  10 mg Oral TID WC   multivitamin  1 tablet Oral QHS   pantoprazole  40 mg Oral Daily   sucroferric oxyhydroxide  1,000 mg Oral TID WC   traZODone  150 mg Oral QHS    Dialysis Orders: [facility] TTS  3:45 450/A1.5x 2K/2Ca EDW 85.7kg TDC  -Heparin 3000 units IV TIW  -No ESA -Calcitriol 1.5 MCG PO three times per week  Assessment/Plan: 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 ID RX  Blood Cx 6/16 MSSA, negative Cx on 6/17, 6/19. TDC removed 6/18. Completed 72 hour line holiday. TTE concerning for endocarditis. TEE cancelled d/t high risk factors. LS MRI with possible L5-S1 discitis. Temp cath placed 6/21, but poor function requiring exchange -  Will be getting IV Cefazolin 2g q HD x 6 weeks (until 11/04/22). Deconditioning [name]: in CIR Dialysis Access: RIJ TDC removed 6/18 - s/p line holiday, then temp line placed 6/21 with recurrent non-function issues despite exchange 6/26. S/p [facility] 7/1 in IR.  SBO - on CT Ab 6/19. Now resolved, surgery team has signed off. Hyperkalemia: resolved Hyponatremia: Na 129 this a.m. managing with HD ESRD: Usual TTS schedule - then line issues preventing HD 6/27 -7/1. Now resolved and HD 7/1, 7/2 to correct.  Next HD Tuesday 10/07/2022 Perm Access: S/p L AVF ligation in 2019. Last seen by Dr. Edilia Bo 01/2022 - limited mobility in R arm so planning for L arm graft but had not scheduled surgery yet. Can revisit as an outpatient once bacteremia resolved.  Hypotension/volume: on midodrine. UF as tolerated.  Mild edema on exam, increased SOB which could also be due to worsened anemia. Getting under edw, will need lower EDW on discharge.  UF as tolerated. Anemia of ESRD: Hgb 6.1 this AM. continue Aranesp q Thursday, increased next dose  to .  To get transfusion - appear to be waiting for blood from blood bank in [place]?  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 Binder changed to Velphoro now at goal. Increased dose to 2 tabs PO TID AC.   Nutrition - Renal diet, Alb low - continue supplements  Virgina Norfolk, PA-C [facility] 10/07/2022,12:02 PM  LOS: 12 days

## 2022-10-07 NOTE — Progress Notes (Signed)
Checked in on patient, appears his usual self but fatigued. Following up with blood bank about status of transfusion.

## 2022-10-07 NOTE — Evaluation (Addendum)
Speech Language Pathology Bedside Swallow Evaluation and Plan  Patient Details  Name: Ryan [name] MRN: [id number] Date of Birth: 09/01/1960  SLP Diagnosis: Cognitive Impairments;Dysphagia  Rehab Potential: Good ELOS: 7/19    Today's Date: 10/07/2022 SLP Individual Time: 1100-1200 SLP Individual Time Calculation (min): 60 min   Hospital Problem: Principal Problem:   Acute osteomyelitis of lumbar spine (HCC) Active Problems:   ESRD on dialysis (HCC)   Cough   Hyperkalemia   Diskitis   Leukocytosis  Past Medical History:  Past Medical History:  Diagnosis Date   Allergy    Anemia    Blood transfusion without reported diagnosis    Dialysis patient (HCC)    Tues,thurs,sat   ESRD (end stage renal disease) (HCC)    TTHS-    Family history of adverse reaction to anesthesia    father had allergic reaction with anesthesia with a dental procedure-(pt. doesn't know)   Hyperlipidemia    Hypertension    Past Surgical History:  Past Surgical History:  Procedure Laterality Date   A/V FISTULAGRAM Left 12/15/2016   Procedure: A/V Fistulagram - left;  Surgeon: Chuck Hint, MD;  Location: [facility] INVASIVE CV LAB;  Service: Cardiovascular;  Laterality: Left;   AV FISTULA PLACEMENT Left 08/28/2016   Procedure: LEFT UPPER  ARM ARTERIOVENOUS (AV) FISTULA CREATION;  Surgeon: Chuck Hint, MD;  Location: [facility] OR;  Service: Vascular;  Laterality: Left;   DIALYSIS/PERMA CATHETER INSERTION Right 12/21/2017   Procedure: INSERTION OF DIALYSIS CATHETER Right Internal Jugular .;  Surgeon: Sherren Kerns, MD;  Location: [facility] OR;  Service: Vascular;  Laterality: Right;   FISTULA SUPERFICIALIZATION Left 09/23/2017   Procedure: FISTULA PLICATION LEFT ARM;  Surgeon: Sherren Kerns, MD;  Location: [facility] OR;  Service: Vascular;  Laterality: Left;   FISTULOGRAM Left 12/21/2017   Procedure: FISTULOGRAM with Balloon Angioplasty.;  Surgeon: Sherren Kerns, MD;  Location: [facility] OR;   Service: Vascular;  Laterality: Left;   HEMATOMA EVACUATION Left 08/27/2020   Procedure: EVACUATION HEMATOMA LEFT ARM;  Surgeon: Sherren Kerns, MD;  Location: [facility] OR;  Service: Vascular;  Laterality: Left;   IR FLUORO GUIDE CV LINE LEFT  09/22/2022   IR FLUORO GUIDE CV LINE RIGHT  09/12/2022   IR FLUORO GUIDE CV LINE RIGHT  09/17/2022   IR REMOVAL TUN CV CATH W/O FL  09/09/2022   IR THORACENTESIS ASP PLEURAL SPACE W/IMG GUIDE  08/22/2017   1.2 L -right-sided   IR THORACENTESIS ASP PLEURAL SPACE W/IMG GUIDE  10/19/2017   IR US GUIDE VASC ACCESS LEFT  09/22/2022   IR US GUIDE VASC ACCESS RIGHT  09/12/2022   KIDNEY TRANSPLANT     REVISON OF ARTERIOVENOUS FISTULA Left 12/21/2017   Procedure: PLICATION and Ligation of F LEFT ARM ARTERIOVENOUS FISTULA;  Surgeon: Sherren Kerns, MD;  Location: [facility] OR;  Service: Vascular;  Laterality: Left;   REVISON OF ARTERIOVENOUS FISTULA Left 07/16/2020   Procedure: EXCISION OF LEFT ARM ARTERIOVENOUS FISTULA;  Surgeon: Sherren Kerns, MD;  Location: [facility] OR;  Service: Vascular;  Laterality: Left;   stent in kidneys     dec 2017   TRANSTHORACIC ECHOCARDIOGRAM  09/22/2017    Severe LVH.  Normal function -EF 55-60%.  GRII DD.  Moderate RV dilation with mildly reduced RV function.   Fixed right coronary cusp with very mild aortic stenosis.  The myocardium has a speckled appearance --> .   Recommend cardiac MRI to evaluate for amyloid   UPPER EXTREMITY VENOGRAPHY  Bilateral 06/01/2020   Procedure: UPPER EXTREMITY VENOGRAPHY;  Surgeon: Chuck Hint, MD;  Location: [facility] INVASIVE CV LAB;  Service: Cardiovascular;  Laterality: Bilateral;    Assessment / Plan / Recommendation Clinical Impression Patient presents with s/sx of mild pharyngeal dysphagia. Bedside swallow evaluation was completed due to patient reports of difficulty swallowing and recent diagnosis of PNA. Oral mechanism exam was [facility]. Patient self fed thin liquids, puree, and solids. Oral phase WFL. Noted  delayed cough after thin liquids and solids, however unsure if related to bolus misdirection or current PNA. Recommend MBS to assess oropharyngeal swallow function. Recommend initiating dysphagia 3 diet due to patient preference. Medications should be administered crushed in puree as patient reports feeling "as if pills are getting stuck." Patient should be upright at close to 90 degrees during meals with intermittent supervision.   After completion of BSE, SLP addressed cognitive goals. Patient answered comprehension and functional mathematical questions with a TV schedule. Patient required min to mod verbal/tactile cues to complete mildly complex mathematical calculations. Patient reports often utilizing pen/paper to complete calculations and would like to work on this in future sessions. Patient left in bed with alarm set and call bell in reach. Continue POC.   Skilled Therapeutic Interventions          Bedside Swallowing Evaluation was completed. Please see above for details.   SLP Assessment  Patient will need skilled Speech Lanaguage Pathology Services during CIR admission    Recommendations  SLP Diet Recommendations: Dysphagia 3 (Mech soft);Thin Liquid Administration via: Straw;Cup Medication Administration: Crushed with puree Supervision: Patient able to self feed Compensations: Small sips/bites Postural Changes and/or Swallow Maneuvers: Seated upright 90 degrees Oral Care Recommendations: Oral care BID Patient destination: Home Follow up Recommendations: Home Health SLP    SLP Frequency 1 to 3 out of 7 days   SLP Duration  SLP Intensity  SLP Treatment/Interventions 7/19  Minumum of 1-2 x/day, 30 to 90 minutes  Cognitive remediation/compensation;Cueing hierarchy;Environmental controls;Functional tasks;Internal/external aids;Patient/family education;Speech/Language facilitation;Therapeutic Activities    Pain Pain Assessment Pain Scale: 0-10 Pain Score: 0-No pain  SLP  Evaluation Cognition Orientation Level: Oriented X4  Oral Motor Oral Motor/Sensory Function Overall Oral Motor/Sensory Function: Within functional limits Motor Speech Overall Motor Speech: Appears within functional limits for tasks assessed Respiration: Within functional limits Phonation: Normal  Care Tool Care Tool Cognition Ability to hear (with hearing aid or hearing appliances if normally used Ability to hear (with hearing aid or hearing appliances if normally used): 0. Adequate - no difficulty in normal conservation, social interaction, listening to TV   Expression of Ideas and Wants Expression of Ideas and Wants: 4. Without difficulty (complex and basic) - expresses complex messages without difficulty and with speech that is clear and easy to understand   Understanding Verbal and Non-Verbal Content Understanding Verbal and Non-Verbal Content: 4. Understands (complex and basic) - clear comprehension without cues or repetitions  Memory/Recall Ability      Bedside Swallowing Assessment General Previous Swallow Assessment: n/a Diet Prior to this Study: Regular Respiratory Status: Supplemental O2 delivered via (comment) (Nasal Cannula) Behavior/Cognition: Alert;Cooperative;Pleasant mood Oral Cavity - Dentition: Adequate natural dentition Self-Feeding Abilities: Able to feed self Patient Positioning: Partially reclined Baseline Vocal Quality: Normal Volitional Cough: Strong Volitional Swallow: Able to elicit  Oral Care Assessment Oral Assessment  (WDL): Within Defined Limits Level of Consciousness: Alert Ice Chips Ice chips: Not tested Thin Liquid Thin Liquid: Impaired Presentation: Straw Pharyngeal  Phase Impairments: Cough - Delayed Nectar Thick Nectar  Thick Liquid: Not tested Honey Thick Honey Thick Liquid: Not tested Puree Puree: Within functional limits Presentation: Self Fed;Spoon Solid Solid: Impaired Pharyngeal Phase Impairments: Cough - Delayed BSE  Assessment Suspected Esophageal Findings Suspected Esophageal Findings:  (Complaints of GERD yesterday) Risk for Aspiration Impact on safety and function: Moderate aspiration risk Other Related Risk Factors: History of pneumonia;Deconditioning;Decreased respiratory status;Cognitive impairment;Lethargy  Short Term Goals: Week 2: SLP Short Term Goal 1 (Week 2): STGs = LTGs 2* ELOS  Refer to Care Plan for Long Term Goals  Recommendations for other services: None   Discharge Criteria: Patient will be discharged from SLP if patient refuses treatment 3 consecutive times without medical reason, if treatment goals not met, if there is a change in medical status, if patient makes no progress towards goals or if patient is discharged from hospital.  The above assessment, treatment plan, treatment alternatives and goals were discussed and mutually agreed upon: by patient  Mitchelle Sultan M.A., CF-SLP 10/07/2022, 3:09 PM

## 2022-10-07 NOTE — Progress Notes (Signed)
   10/07/22 1822  Vitals  Temp 98.8 F (37.1 C)  Pulse Rate (!) 107  Resp (!) 29  BP 122/71  SpO2 96 %  O2 Device Nasal Cannula  Weight 80.8 kg  Type of Weight Post-Dialysis  Oxygen Therapy  O2 Flow Rate (L/min) 2 L/min  Patient Activity (if Appropriate) In bed  Pulse Oximetry Type Continuous  Oximetry Probe Site Changed No  Post Treatment  Dialyzer Clearance Lightly streaked  Duration of HD Treatment -hour(s) 3.75 hour(s)  Hemodialysis Intake (mL) 0 mL  Liters Processed 89.8  Fluid Removed (mL) 2500 mL  Tolerated HD Treatment Yes   Received patient in bed to unit.  Alert and oriented.  Informed consent signed and in chart.   TX duration:3.75  Patient tolerated well.  Transported back to the room  Alert, without acute distress.  Hand-off given to patient's nurse.   Access used: LDLC Access issues: lines reversed  Total UF removed: 2500 Medication(s) given: 1 unit PRBCS   [facility]

## 2022-10-07 NOTE — Progress Notes (Signed)
Patient ID: Ryan [name], male   DOB: [birth year], 62 y.o.   MRN: [id number]  Met with pt to discuss team conference update regarding medical issues have interfered with his ability to participate in therapies and being ill and not able too. He reports he feels better today and hopes once can get the blood transfusion he will feel even better. He is currently on O2 and the hope is to wean this. The new discharge date is 7/24 to hopefully get medically stable and back toward work on on his goals of mod/I level. He is agreeable to this new plan. Have updated renal navigator-tracy of new discharge date. Trying to get clarification regarding IV antibiotics and if to get while in dialysis or if will need to set up at home. Continue to work on discharge needs.

## 2022-10-07 NOTE — Procedures (Signed)
I was present at this dialysis session. I have reviewed the session itself and made appropriate changes.   Filed Weights   10/06/22 0518 10/07/22 0500 10/07/22 1358  Weight: 77.1 kg 83.6 kg 83.8 kg    Recent Labs  Lab 10/06/22 0637  NA 129*  K 3.8  CL 92*  CO2 24  GLUCOSE 100*  BUN 41*  CREATININE 7.89*  CALCIUM 8.2*  PHOS 4.1    Recent Labs  Lab 10/05/22 2023 10/06/22 0637 10/07/22 0548  WBC 14.7* 13.8* 12.9*  NEUTROABS 12.3* 11.1* 10.3*  HGB 7.0* 6.3* 6.1*  HCT 22.8* 21.3* 20.0*  MCV 83.2 83.5 82.6  PLT 344 321 358    Scheduled Meds:  (feeding supplement) PROSource Plus  30 mL Oral TID BM   sodium chloride   Intravenous Once   albuterol  2.5 mg Nebulization TID   benzonatate  100 mg Oral BID   calcitRIOL  0.5 mcg Oral Q T,Th,Sat-1800   Chlorhexidine Gluconate Cloth  6 each Topical Q12H   cinacalcet  30 mg Oral Q supper   [START ON 10/09/2022] darbepoetin (ARANESP) injection - DIALYSIS  150 mcg Subcutaneous Q Thu-1800   feeding supplement (NEPRO CARB STEADY)  237 mL Oral TID BM   fluticasone  1 spray Each Nare Daily   guaiFENesin  600 mg Oral BID   loratadine  10 mg Oral Daily   methocarbamol  250 mg Oral BID   midodrine  10 mg Oral TID WC   multivitamin  1 tablet Oral QHS   pantoprazole  40 mg Oral Daily   sucroferric oxyhydroxide  1,000 mg Oral TID WC   traZODone  150 mg Oral QHS   Continuous Infusions:  piperacillin-tazobactam (ZOSYN)  IV 2.25 g (10/07/22 0606)   PRN Meds:.acetaminophen, albuterol, bisacodyl, calcium carbonate, camphor-menthol, chlorproMAZINE, diphenhydrAMINE, guaiFENesin-codeine, guaiFENesin-dextromethorphan, lip balm, melatonin, menthol-cetylpyridinium, milk and molasses, oxyCODONE, phenol, polyethylene glycol, senna, simethicone   Anthony Sar, MD [facility] 10/07/2022, 4:25 PM

## 2022-10-07 NOTE — Progress Notes (Signed)
This nurse obtained call from lab for critical lab value of hemoglobin of 6.1. PA Mariam Dollar made aware face to face.

## 2022-10-07 NOTE — Progress Notes (Signed)
PROGRESS NOTE    Ryan [name]  WUJ:[id number] DOB: 14-Oct-1960 DOA: 09/25/2022 PCP: Sharin Grave, MD     Brief Narrative:  Ryan [name] is a 62 y.o. male with past medical history of hypertension, hyperlipidemia, and ESRD on hemodialysis who initially presented to the hospital on 6/6 with complaints of low back pain ultimately found to have evidence of L5/S1 discitis and osteomyelitis as well as MSSA bacteremia with mitral valve endocarditis.  CT scan of the chest noted septic emboli with new airspace opacities in the left upper lobe . MRI brain noting scattered subcentimeter infarcts involving the bilateral cerebral hemisphere and right cerebellum.  CT surgery has been consulted but recommended conservative management as.  He was already responding to antibiotics.  Plan was for patient to continue on antibiotics at cefazolin until 8/13 per ID recommendations.  Hospital course had also been complicated by small bowel obstruction which has since resolved.  Patient had been discharged to inpatient rehab on 7/4.  Patient reports that he has had an intermittent cough that is mostly nonproductive that started during his hospitalization.  Reports cough seems to worsen with talking.  He has intermittently gotten choked up with certain foods.  His last bowel movement was approximately 2 days ago.  Chest x-ray noted poor inspiratory effort with no acute abnormality noted.  Labs from today note WBC 13.3, hemoglobin 8.5, sodium 126, potassium 5.2, BUN 61, creatinine 8.75, and BNP 355.   New events last 24 hours / Subjective: Feeling better this morning.  Still has not gotten his blood transfusion yet.  Breathing improved.  Assessment & Plan:   Principal Problem:   Acute osteomyelitis of lumbar spine (HCC) Active Problems:   Leukocytosis   Cough   ESRD on dialysis (HCC)   Hyperkalemia   Diskitis   Acute hypoxemic respiratory failure -Requiring 2 L nasal cannula O2 --> wean -Question  possible aspiration pneumonia/pneumonitis -COVID, respiratory viral panel negative -Zosyn  Anemia of chronic disease -Still waiting for his blood transfusion -FOBT pending  Tachycardia -Question if he had A-fib during dialysis 7/13  -EKG reviewed independently shows sinus tachycardia with rate 105  Other medical issues including: L5/S1 discitis and osteomyelitis, MSSA bacteremia with mitral valve endocarditis, septic emboli to the lungs, septic emboli to the brain Persistent leukocytosis in setting of infection as above ESRD on dialysis Anemia of chronic disease Small bowel obstruction, resolved -Patient remains on cefazolin until 8/13 -Dialysis per nephrology -Continue rehab efforts   TRH will continue to follow   Antimicrobials:  Anti-infectives (From admission, onward)    Start     Dose/Rate Route Frequency Ordered Stop   10/05/22 2315  piperacillin-tazobactam (ZOSYN) IVPB 2.25 g        2.25 g 100 mL/hr over 30 Minutes Intravenous Every 8 hours 10/05/22 2229     09/30/22 1800  ceFAZolin (ANCEF) IVPB 2g/100 mL premix  Status:  Discontinued       Note to Pharmacy: can we give 3gm of ancef tomorrow instead of 2gm? (Only for satursdays)   2 g 200 mL/hr over 30 Minutes Intravenous Once per day on Tue Thu 09/26/22 1107 10/05/22 2229   09/27/22 1800  ceFAZolin (ANCEF) IVPB 2g/100 mL premix  Status:  Discontinued       Note to Pharmacy: can we give 3gm of ancef tomorrow instead of 2gm? (Only for satursdays)   2 g 200 mL/hr over 30 Minutes Intravenous Every T-Th-Sa (1800) 09/26/22 0809 09/26/22 1107   09/27/22 1800  ceFAZolin (  ANCEF) IVPB 3g/100 mL premix  Status:  Discontinued        3 g 200 mL/hr over 30 Minutes Intravenous Once per day on Sat 09/26/22 1107 10/05/22 2229   09/25/22 1800  ceFAZolin (ANCEF) IVPB 2g/100 mL premix  Status:  Discontinued        2 g 200 mL/hr over 30 Minutes Intravenous Every T-Th-Sa (1800) 09/25/22 1647 09/26/22 0809         Objective: Vitals:   10/07/22 0605 10/07/22 0729 10/07/22 0817 10/07/22 1059  BP: 94/60 (!) 100/59  (!) 92/55  Pulse: 94 94 94 97  Resp: 20 19 18 18   Temp: 98.6 F (37 C) 97.6 F (36.4 C)  97.8 F (36.6 C)  TempSrc: Oral Oral  Oral  SpO2: 97%   94%  Weight:      Height:        Intake/Output Summary (Last 24 hours) at 10/07/2022 1230 Last data filed at 10/06/2022 1810 Gross per 24 hour  Intake 240 ml  Output --  Net 240 ml   Filed Weights   10/05/22 0515 10/06/22 0518 10/07/22 0500  Weight: 77.2 kg 77.1 kg 83.6 kg    Examination:  General exam: Appears calm and comfortable  Respiratory system: Mildly tachypneic this morning. CTAB.  On 2 L oxygen Cardiovascular system: S1 & S2 heard, RRR. No murmurs. No pedal edema. Gastrointestinal system: Abdomen is nondistended, soft and nontender. Normal bowel sounds heard. Central nervous system: Alert and oriented. No focal neurological deficits. Speech clear.  Extremities: Symmetric in appearance  Skin: No rashes, lesions or ulcers on exposed skin  Psychiatry: Judgement and insight appear normal. Mood & affect appropriate.   Data Reviewed: I have personally reviewed following labs and imaging studies  CBC: Recent Labs  Lab 10/02/22 1234 10/05/22 2023 10/06/22 0637 10/07/22 0548  WBC 13.3* 14.7* 13.8* 12.9*  NEUTROABS 11.0* 12.3* 11.1* 10.3*  HGB 8.5* 7.0* 6.3* 6.1*  HCT 26.8* 22.8* 21.3* 20.0*  MCV 84.0 83.2 83.5 82.6  PLT 352 344 321 358   Basic Metabolic Panel: Recent Labs  Lab 10/02/22 1234 10/03/22 0629 10/04/22 0553 10/05/22 0531 10/05/22 2023 10/06/22 0637  NA 128* 128* 128* 130* 129* 129*  K 5.3* 4.0 4.1 3.6 3.8 3.8  CL 85* 92* 93* 94* 89* 92*  CO2 24 23 24 26 25 24   GLUCOSE 109* 87 107* 106* 121* 100*  BUN 61* 27* 39* 27* 36* 41*  CREATININE 9.09* 5.67* 7.44* 5.79* 6.93* 7.89*  CALCIUM 8.9 7.8* 8.1* 8.2* 8.5* 8.2*  PHOS 4.5 3.8 3.8 3.9  --  4.1   GFR: Estimated Creatinine Clearance: 10.5  mL/min (A) (by C-G formula based on SCr of 7.89 mg/dL (H)). Liver Function Tests: Recent Labs  Lab 10/02/22 1234 10/03/22 0629 10/04/22 0553 10/05/22 0531 10/06/22 0637  ALBUMIN 2.1* 1.6* 1.7* 1.8* 1.6*   No results for input(s): "LIPASE", "AMYLASE" in the last 168 hours. No results for input(s): "AMMONIA" in the last 168 hours. Coagulation Profile: No results for input(s): "INR", "PROTIME" in the last 168 hours. Cardiac Enzymes: No results for input(s): "CKTOTAL", "CKMB", "CKMBINDEX", "TROPONINI" in the last 168 hours. BNP (last 3 results) No results for input(s): "PROBNP" in the last 8760 hours. HbA1C: No results for input(s): "HGBA1C" in the last 72 hours. CBG: No results for input(s): "GLUCAP" in the last 168 hours.  Lipid Profile: No results for input(s): "CHOL", "HDL", "LDLCALC", "TRIG", "CHOLHDL", "LDLDIRECT" in the last 72 hours. Thyroid Function Tests: No results for  input(s): "TSH", "T4TOTAL", "FREET4", "T3FREE", "THYROIDAB" in the last 72 hours. Anemia Panel: No results for input(s): "VITAMINB12", "FOLATE", "FERRITIN", "TIBC", "IRON", "RETICCTPCT" in the last 72 hours. Sepsis Labs: Recent Labs  Lab 10/02/22 1234 10/05/22 2023 10/05/22 2257  PROCALCITON 5.26 5.04  --   LATICACIDVEN  --  1.3 1.0    Recent Results (from the past 240 hour(s))  Culture, blood (Routine X 2) w Reflex to ID Panel     Status: None (Preliminary result)   Collection Time: 10/05/22  8:23 PM   Specimen: BLOOD  Result Value Ref Range Status   Specimen Description BLOOD BLOOD RIGHT HAND  Final   Special Requests   Final    BOTTLES DRAWN AEROBIC AND ANAEROBIC Blood Culture adequate volume   Culture   Final    NO GROWTH 2 DAYS Performed at [facility], [address] [place], [place] [address]    Report Status PENDING  Incomplete  Culture, blood (Routine X 2) w Reflex to ID Panel     Status: None (Preliminary result)   Collection Time: 10/05/22  8:32 PM   Specimen: BLOOD   Result Value Ref Range Status   Specimen Description BLOOD BLOOD RIGHT ARM  Final   Special Requests   Final    BOTTLES DRAWN AEROBIC AND ANAEROBIC Blood Culture adequate volume   Culture   Final    NO GROWTH 2 DAYS Performed at [facility], [address] [place], [place] [address]    Report Status PENDING  Incomplete  Respiratory (~20 pathogens) panel by PCR     Status: None   Collection Time: 10/06/22  1:58 AM   Specimen: Nasopharyngeal Swab; Respiratory  Result Value Ref Range Status   Adenovirus NOT DETECTED NOT DETECTED Final   Coronavirus 229E NOT DETECTED NOT DETECTED Final    Comment: (NOTE) The Coronavirus on the Respiratory Panel, DOES NOT test for the novel  Coronavirus (2019 nCoV)    Coronavirus HKU1 NOT DETECTED NOT DETECTED Final   Coronavirus NL63 NOT DETECTED NOT DETECTED Final   Coronavirus OC43 NOT DETECTED NOT DETECTED Final   Metapneumovirus NOT DETECTED NOT DETECTED Final   Rhinovirus / Enterovirus NOT DETECTED NOT DETECTED Final   Influenza A NOT DETECTED NOT DETECTED Final   Influenza B NOT DETECTED NOT DETECTED Final   Parainfluenza Virus 1 NOT DETECTED NOT DETECTED Final   Parainfluenza Virus 2 NOT DETECTED NOT DETECTED Final   Parainfluenza Virus 3 NOT DETECTED NOT DETECTED Final   Parainfluenza Virus 4 NOT DETECTED NOT DETECTED Final   Respiratory Syncytial Virus NOT DETECTED NOT DETECTED Final   Bordetella pertussis NOT DETECTED NOT DETECTED Final   Bordetella Parapertussis NOT DETECTED NOT DETECTED Final   Chlamydophila pneumoniae NOT DETECTED NOT DETECTED Final   Mycoplasma pneumoniae NOT DETECTED NOT DETECTED Final    Comment: Performed at [facility], [address] [place], [place] [address]  SARS Coronavirus 2 by RT PCR (hospital order, performed in [facility] hospital lab) *cepheid single result test* Anterior Nasal Swab     Status: None   Collection Time: 10/06/22  1:58 AM   Specimen: Anterior Nasal Swab  Result Value Ref  Range Status   SARS Coronavirus 2 by RT PCR NEGATIVE NEGATIVE Final    Comment: Performed at [facility], [address] [place], [place] [address]      Radiology Studies: DG Chest 2 View  Result Date: 10/05/2022 CLINICAL DATA:  [id number].  Fever. EXAM: CHEST - 2 VIEW COMPARISON:  Chest 10/02/2022 FINDINGS: Inpatient AP Lat at 8:49 p.m. Left IJ dialysis catheter terminates in the upper right atrium. Moderate cardiomegaly. There is interval onset of perihilar vascular congestion and mild-to-moderate interstitial edema in the mid to lower lung fields, findings consistent with CHF or fluid overload. There are increased left-greater-than-right small pleural effusions, with increased left lower lobe opacity consistent with atelectasis, consolidation or aspiration. No other focal consolidation is seen. The mediastinum is stable with aortic ectasia and tortuosity, atherosclerosis. No new osseous findings. Thoracic spondylosis. In all other respects no further changes. IMPRESSION: 1. Cardiomegaly with perihilar vascular congestion and mild-to-moderate interstitial edema consistent with CHF or fluid overload. 2. Increased left-greater-than-right pleural effusions. 3. Increased left lower lobe opacity consistent with atelectasis, consolidation or aspiration. Electronically Signed   By: Almira Bar M.D.   On: 10/05/2022 21:03      Scheduled Meds:  (feeding supplement) PROSource Plus  30 mL Oral TID BM   sodium chloride   Intravenous Once   albuterol  2.5 mg Nebulization TID   benzonatate  100 mg Oral BID   calcitRIOL  0.5 mcg Oral Q T,Th,Sat-1800   Chlorhexidine Gluconate Cloth  6 each Topical Q12H   cinacalcet  30 mg Oral Q supper   [START ON 10/09/2022] darbepoetin (ARANESP) injection - DIALYSIS  150 mcg Subcutaneous Q Thu-1800   feeding supplement (NEPRO CARB STEADY)  237 mL Oral TID BM   fluticasone  1 spray Each Nare Daily   guaiFENesin  600 mg Oral BID   loratadine  10 mg Oral Daily    methocarbamol  250 mg Oral BID   midodrine  10 mg Oral TID WC   multivitamin  1 tablet Oral QHS   pantoprazole  40 mg Oral Daily   sucroferric oxyhydroxide  1,000 mg Oral TID WC   traZODone  150 mg Oral QHS   Continuous Infusions:  piperacillin-tazobactam (ZOSYN)  IV 2.25 g (10/07/22 0606)     LOS: 12 days   Time spent: 25 minutes   Noralee Stain, DO [facility] 10/07/2022, 12:30 PM   Available via Epic secure chat 7am-7pm After these hours, please refer to coverage provider listed on [email or web address]

## 2022-10-07 NOTE — Progress Notes (Signed)
Advised by rehab CSW of pt's new d/c date- Wed, July 24. Contacted [facility] to make clinic aware of date change. Will assist as needed.   Olivia Canter Renal Navigator [phone]

## 2022-10-07 NOTE — Progress Notes (Signed)
Physical Therapy Session Note  Patient Details  Name: Ryan [name] MRN: [id number] Date of Birth: 01/15/1961  Today's Date: 10/07/2022 PT Missed Time: 45 Minutes Missed Time Reason: Patient ill (Comment) (fatigue)  Short Term Goals: Week 2:  PT Short Term Goal 1 (Week 2): STG's=LTG's due to ELOS  Skilled Therapeutic Interventions/Progress Updates:      Therapy Documentation Precautions:  Precautions Precautions: Fall Restrictions Weight Bearing Restrictions: No  Pt politely declined PT due to fatigue and prefers to hold PT until treatment finishes today. Pt missed 45 minutes of skilled PT.     Therapy/Group: Individual Therapy  Truitt Leep Truitt Leep PT, DPT  10/07/2022, 5:57 AM

## 2022-10-07 NOTE — Plan of Care (Signed)
  Problem: Consults Goal: RH GENERAL PATIENT EDUCATION Description: See Patient Education module for education specifics. Outcome: Progressing   Problem: RH BOWEL ELIMINATION Goal: RH STG MANAGE BOWEL WITH ASSISTANCE Description: STG Manage Bowel with min Assistance. Outcome: Progressing   Problem: RH BLADDER ELIMINATION Goal: RH STG MANAGE BLADDER WITH ASSISTANCE Description: STG Manage Bladder With min Assistance Outcome: Progressing   Problem: Education: Goal: Knowledge of General Education information will improve Description: Including pain rating scale, medication(s)/side effects and non-pharmacologic comfort measures Outcome: Progressing   Problem: Health Behavior/Discharge Planning: Goal: Ability to manage health-related needs will improve Outcome: Progressing   Problem: Clinical Measurements: Goal: Ability to maintain clinical measurements within normal limits will improve Outcome: Progressing Goal: Will remain free from infection Outcome: Progressing Goal: Diagnostic test results will improve Outcome: Progressing Goal: Respiratory complications will improve Outcome: Progressing Goal: Cardiovascular complication will be avoided Outcome: Progressing   Problem: Activity: Goal: Risk for activity intolerance will decrease Outcome: Progressing   Problem: Nutrition: Goal: Adequate nutrition will be maintained Outcome: Progressing   Problem: Coping: Goal: Level of anxiety will decrease Outcome: Progressing   Problem: Elimination: Goal: Will not experience complications related to bowel motility Outcome: Progressing Goal: Will not experience complications related to urinary retention Outcome: Progressing   Problem: Pain Managment: Goal: General experience of comfort will improve Outcome: Progressing   Problem: Safety: Goal: Ability to remain free from injury will improve Outcome: Progressing   Problem: Skin Integrity: Goal: Risk for impaired skin  integrity will decrease Outcome: Progressing

## 2022-10-07 NOTE — Progress Notes (Signed)
PROGRESS NOTE   Subjective/Complaints:  Pt reports feeling much more himself.   LBM 2 days ago- feels constipated.  Tried and couldn't go last evening.  Cough about the same when meds wear off, but better when takes cough meds- esp albuterol nebs work immediately.   Asking when going to get blood.    ROS:   Pt denies SOB, abd pain, CP, N/V/ (+)C/D, and vision changes  Except for HPI  Objective:   DG Chest 2 View  Result Date: 10/05/2022 CLINICAL DATA:  [id number].  Fever. EXAM: CHEST - 2 VIEW COMPARISON:  Chest 10/02/2022 FINDINGS: Inpatient AP Lat at 8:49 p.m. Left IJ dialysis catheter terminates in the upper right atrium. Moderate cardiomegaly. There is interval onset of perihilar vascular congestion and mild-to-moderate interstitial edema in the mid to lower lung fields, findings consistent with CHF or fluid overload. There are increased left-greater-than-right small pleural effusions, with increased left lower lobe opacity consistent with atelectasis, consolidation or aspiration. No other focal consolidation is seen. The mediastinum is stable with aortic ectasia and tortuosity, atherosclerosis. No new osseous findings. Thoracic spondylosis. In all other respects no further changes. IMPRESSION: 1. Cardiomegaly with perihilar vascular congestion and mild-to-moderate interstitial edema consistent with CHF or fluid overload. 2. Increased left-greater-than-right pleural effusions. 3. Increased left lower lobe opacity consistent with atelectasis, consolidation or aspiration. Electronically Signed   By: Almira Bar M.D.   On: 10/05/2022 21:03   Recent Labs    10/06/22 0637 10/07/22 0548  WBC 13.8* 12.9*  HGB 6.3* 6.1*  HCT 21.3* 20.0*  PLT 321 358   Recent Labs    10/05/22 2023 10/06/22 0637  NA 129* 129*  K 3.8 3.8  CL 89* 92*  CO2 25 24  GLUCOSE 121* 100*  BUN 36* 41*  CREATININE 6.93* 7.89*  CALCIUM 8.5* 8.2*     Intake/Output Summary (Last 24 hours) at 10/07/2022 1008 Last data filed at 10/06/2022 1810 Gross per 24 hour  Intake 240 ml  Output --  Net 240 ml         Physical Exam: Vital Signs Blood pressure (!) 100/59, pulse 94, temperature 97.6 F (36.4 C), temperature source Oral, resp. rate 18, height 5\' 11"  (1.803 m), weight 83.6 kg, SpO2 97%.     General: awake, alert, appropriate, sitting up in bed receiving albuterol nebs- resp therapy in room; NAD HENT: conjugate gaze; oropharynx dry- pale conjunctivae CV: regular rhythm tachycardic rate; no JVD Pulmonary: decreased throughout however no W/R/R GI: soft, NT, ND, (+)BS- slightly hypoactive Psychiatric: appropriate Neurological: Ox3  PRIOR EXAMS: Skin: + L upper chest HD catheter - dressing c/d/i + Nonpainful erythematous lesions on bilateral feet  MSK:      No apparent deformity.      Strength: Exam limited d/t being on HD; all 4 limbs equal, antigravity and against resistance   Neurologic exam:  Cognition: AAO to person, place, time and event.  + Mild lethargy - intermittently falling asleep during late dialysis; more awake in early exam Language: Fluent, No substitutions or neoglisms. No dysarthria. Names 3/3 objects correctly.  Memory: Recalls 3/3 objects at 5 minutes. No apparent deficits  Insight: Good  insight into  current condition.  Mood: Pleasant affect, appropriate mood.  Sensation: To light touch intact in BL UEs and LEs  Reflexes: 1+ in BL UE and LEs. Negative Hoffman's and babinski signs bilaterally.  CN: 2-12 grossly intact.  Coordination: No apparent tremors. No ataxia on FTN, HTS bilaterally.  Spasticity: MAS 0 in all extremities.       Assessment/Plan: 1. Functional deficits which require 3+ hours per day of interdisciplinary therapy in a comprehensive inpatient rehab setting. Physiatrist is providing close team supervision and 24 hour management of active medical problems listed  below. Physiatrist and rehab team continue to assess barriers to discharge/monitor patient progress toward functional and medical goals  Care Tool:  Bathing    Body parts bathed by patient: Right arm, Left arm, Chest, Abdomen, Front perineal area, Buttocks, Right upper leg, Left upper leg, Right lower leg, Left lower leg, Face         Bathing assist Assist Level: Set up assist     Upper Body Dressing/Undressing Upper body dressing   What is the patient wearing?: Button up shirt    Upper body assist Assist Level: Minimal Assistance - Patient > 75%    Lower Body Dressing/Undressing Lower body dressing      What is the patient wearing?: Underwear/pull up     Lower body assist Assist for lower body dressing: Minimal Assistance - Patient > 75%     Toileting Toileting    Toileting assist Assist for toileting: Supervision/Verbal cueing     Transfers Chair/bed transfer  Transfers assist     Chair/bed transfer assist level: Supervision/Verbal cueing     Locomotion Ambulation   Ambulation assist      Assist level: Minimal Assistance - Patient > 75% Assistive device: Rollator Max distance: 170'   Walk 10 feet activity   Assist     Assist level: Minimal Assistance - Patient > 75% Assistive device: Walker-rolling   Walk 50 feet activity   Assist    Assist level: Minimal Assistance - Patient > 75% Assistive device: Walker-rolling    Walk 150 feet activity   Assist    Assist level: Minimal Assistance - Patient > 75% Assistive device: Walker-rolling    Walk 10 feet on uneven surface  activity   Assist Walk 10 feet on uneven surfaces activity did not occur: Safety/medical concerns         Wheelchair     Assist Is the patient using a wheelchair?: Yes Type of Wheelchair: Manual    Wheelchair assist level: Dependent - Patient 0% (pain limits)      Wheelchair 50 feet with 2 turns activity    Assist        Assist Level:  Dependent - Patient 0%   Wheelchair 150 feet activity     Assist      Assist Level: Dependent - Patient 0%   Blood pressure (!) 100/59, pulse 94, temperature 97.6 F (36.4 C), temperature source Oral, resp. rate 18, height 5\' 11"  (1.803 m), weight 83.6 kg, SpO2 97%.  Medical Problem List and Plan: 1. Functional deficits secondary to lumbar osteomyelitis/diskitis d/t MSSA bacteremia.heart vegetations/Endocarditis              -patient may shower             -ELOS/Goals: PT/OT/SLP supervision to mod I, 10-14 days  -d/c 7/19  Only bed level therapy- will not d/c 7/19 unless pops back up after transfusion  Con't CIR  Team conference today to f/u on  progress 2.  Antithrombotics: -DVT/anticoagulation:  Pharmaceutical: Heparin 5000 q8h             -antiplatelet therapy: N/A 3. Low back pain/Pain Management: tylenol, Oxycodone prn. Advised to use oxycodone prior to sleep --will schedule low dose robaxin 250mg  bid as reports ongoing lower back and LE spasms.  -7/5- will increase Oxy to 10-15 mg q4 hours prn-can add long acting over weekend if necessary -7/6-7/24 pain seems controlled for now, monitor 7/12- pain doing much better at 7.5-10 mg q4 hours prn -7/14-7/16pain controlled overall 4. Mood/Behavior/Sleep: LCSW to follow for evaluation and support.              -antipsychotic agents: N/A --acute on chronic insomnia in part due to anxiety. Will schedule low dose trazodone. Continue prn melatonin -7/5- will change trazodone to 75 mg at bedtime- and can be titrated more over weekend if needed.  7/9- sleeping well  7/10- didn't sleep well- will increase trazodone to 150 mg at bedtime 7/12- slept somewhat better- con't trazodone -10/05/22 sleeping better 5. Neuropsych/cognition: This patient is capable of making decisions on his own behalf. 6. Skin/Wound Care: Routine pressure relief measures -continue to monitor new lesions on bilateral feet for evolution/progress   7.  Fluids/Electrolytes/Nutrition: Strict I/O. Daily weights. Labs TTS with HD. Add low salt restrictions             --continue Nephro supplements 8. 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  --IV cefazolin with HD. EOT 11/04/22              --Fevers have resolved. CRP-44/Sed rate 1.5 @ admission.  --Leucocytosis trending down from 23.9-->31.4-->13.9.    -7/5- Pt's WBC down to 13/9- will recheck labs in AM as well as qmonday -09/27/22 WBC 13.2, cont to monitor 7/8- WBC stable at 13k- CRP 17.3 and ESR >140- will call ID- they said ESR not real helpful in ESRD pt's- so to make sure CRP improving and pt looks good, to call them back before pt leaves.  Called, since ESR rising- last checked 3 weeks ago and WBC won't resolve. 7/9- ID ok with labs since ESR not a good indicator in ESRD- follow labs weekly.   7/12- will call ID before leaves rehab 7/15- CRP slightly up to 20 and ESR down to 100 9. SBO w/ enteritis: Has resolved. Surgery team signed off 10. ESRD: HD TTS at the end of the day to help with tolerance of therapy.              --strict I/O with daily weights. Continues to require 4 L oxygen per [place].  --On Liberalized diet but 1200 cc FR/4 gram salt.  Velphoro for elevated phos level. -7/5- Na down to 128- per Renal  -09/27/22 dialysis today 7/9- K+_ 5.3- this AM and Na 127- per renal to intervene- they will likely check labs and follow up 7/10- K+ up, but then held HD yesterday. Na 126-  7/11- Na 126 and K_ 5.2 still- also d/w pt with renal about midodrine- per renal.  7/12- Na up to 128-K+ 4.0 7/15- Na 129- K+ 3.8 11. Anemia of chronic disease: On Aranesp weekly every Thursday w/HD.   7/12- Hb 8.5- better than was of 7.8-8.2  7/15- Hb down to 6.3- concern for GI bleed  7/16- No BM yesterday- feels constipated- so couldn't get FOBT done- will do sorbitol and ask  nursing  to check FOBT 12. HTN: Monitor BP TID. On low dose tenormin 12.5mg  every day but BP with drops during HD.  -7/5- BP running low 90s systolic when laying down- I'm concerned will have orthostatic hypotension-will check with PT/OT  -09/27/22 BPs soft, monitor closely -09/28/22 BP a little softer today, hold atenolol this morning, monitor to see if changes need to be made to that. Denies orthostatic symptoms.  7/8- held atenolol- BP dropped to 60-70's systolic yesterday- was 90s SBP this AM- will monitor- midodrine increased yesterday to 2.5 mg 7/9- will hold atenolol since BP 104 systolic and going to HD today.   7/10- stopped Atenolol and increased Midodrine to 5 mg BID with breakfast/lunch 7/11- increased midodrine to 5 mg TID- but think he might benefit from 10 mg since BP has been running 70s with sitting/standing with therapy.  7/12- BP running 80's this AM- Midodrine up 5-10 mg TID- depending on HD days- asymptomatic as usual- will d/w therapy again.  7/15- BP low, but likely due to ABLA Vitals:   10/05/22 0504 10/05/22 1926 10/05/22 2236 10/06/22 0142  BP: 102/60 127/72 122/72 111/63   10/06/22 0451 10/06/22 0924 10/06/22 1300 10/06/22 1804  BP: 105/62 111/64 108/65 103/64   10/06/22 2111 10/07/22 0150 10/07/22 0605 10/07/22 0729  BP: 105/62 110/64 94/60 (!) 100/59    13. AHRF d/t cavitary pneumonia. Wean oxygen as able. Currently 2-4L; per patient not using CPAP/BiPAP   -7/5- will wean O2 as allowed  -09/27/22 no O2 on today, monitor  -10/04/22 needing O2 last night, doing well today, monitor 14. Constipation:  -09/28/22 had a small BM yesterday, lots of gas, will do dose of miralax now then add on QD PRN dose, advised to take senna with the miralax dose now, then could use the PRN miralax dose after lunch if still not successful-- if not working, then use suppository +/- enema.  7/8- LBM  7/6- small- feels like needs to go- wants to wait on more intervention.   7/9- Had good sized BM  yesterday- feeling better  7/10- bowels "going ok".   -10/05/22 LBM this morning, doing well, monitor  7/16- feels constipated- LBM 2 days ago- will give sorbitol now-  15. New cough/mild SOB  7/11- got CXR_ concern about fluid overload/heart decline in working? Due to enlarged cardiac silhouette and appears like effusion/possible infiltrate- Called IM consult to address- CXR read pending.   7/12- CXR shows more atelectasis- will order flutter valve and tessalon pearls 100 mg BID for cough. Pt reports still bothersome   -10/04/22 cough better, needing O2 today but feels ok; hopefully dialysis today will help with volume; monitor  -10/05/22 no O2 today, somewhat shallow breathing but not really increased WOB; monitor closely  7/15- will order albuterol nebs scheduled for 2 days then PRN and cough meds with codeine.   7/16- says albuterol has made "a world of difference"- in cough/breathing- instant improvement 16. Severe protein Malnutrition  7/12- Alb down to 1.6- will d/w team about what to add for appetite stimulation- thinking Remeron?will order dietitian consult.   7/16- will order another dietitian consult.  17. Tachycardia:  -10/05/22 tachycardic since dialysis yesterday, but completely asymptomatic-- maybe from midodrine? Recently increased. Monitor closely, doubt need for urgent intervention or work up right now  7/15- likely due to low Hb.   7/16- still going on, however has Hb of 6.1 this AM- hopefully will improve when gets blood.    I spent a total of  52  minutes on total care today- >50% coordination of care- due to  Team conference today to f/u on progress; d/w pt prolonged due to blood issues/and bowels- also d/w pt about aspiration pneumonia and d/w PA about Hb- will do 1 units and then see if needs more.   LOS: 12 days A FACE TO FACE EVALUATION WAS PERFORMED  Elya Tarquinio 10/07/2022, 10:08 AM

## 2022-10-07 NOTE — Plan of Care (Signed)
  Problem: RH Swallowing Goal: LTG Patient will participate in dysphagia therapy to increase swallow function with assistance (SLP) Description: LTG:  Patient will participate in dysphagia therapy to increase swallow function with assistance (SLP) Flowsheets (Taken 10/07/2022 1522) LTG: Pt will participate in dysphagia therapy to increase swallow function with assistance of (SLP): Modified Independent

## 2022-10-07 NOTE — Progress Notes (Signed)
Patient off the unit at this time, report given to [facility]. Blood is to be administered in dialysis, [facility] aware.    Tilden Dome, LPN

## 2022-10-07 NOTE — Progress Notes (Signed)
Occupational Therapy Note  Patient Details  Name: Ryan [name] MRN: [id number] Date of Birth: 08/23/1960  Today's Date: 10/07/2022 OT Missed Time: -45 Minutes Missed Time Reason: Patient ill (comment) (pt with severly low hemoglobin, unsafe for tx)   Velia Meyer, OTD, OTR/L 10/07/2022, 4:09 PM

## 2022-10-07 NOTE — Progress Notes (Signed)
Nutrition Follow-up  DOCUMENTATION CODES:   Not applicable  INTERVENTION:  - Continue Prosource TID.   - Continue Nepro Shake po TID, each supplement provides 425 kcal and 19 grams protein  - Modify to Dys 3 diet, 1200 mL fluid.   NUTRITION DIAGNOSIS:   Inadequate oral intake related to poor appetite as evidenced by meal completion < 25%, per patient/family report.  GOAL:   Patient will meet greater than or equal to 90% of their needs - Progressing.   MONITOR:   PO intake, Supplement acceptance  REASON FOR ASSESSMENT:   Consult Assessment of nutrition requirement/status  ASSESSMENT:   with medical history significant for hypertension, hyperlipidemia, and ESRD on hemodialysis who presents to to [facility] on 09/07/22 with acute onset of low back pain. Pt admitted to CIR functional decline secondary to MSSA bacteremia with dissemination to lumbar spine with diskitis/ osteomyelitis, MV endocarditis, and to the brain.  Meds reviewed: calcitriol, Rena-vit. Labs reviewed: Na low, BUN/Creatinine elevated.   The pt reports that he has been having some difficulty chewing ans swallowing certain foods. He reports that he has already discussed this with the SLP that is following. Pt was still on a Renal diet, RD messaged SLP who confirmed that they discussed a Dys 3 diet. RD messaged Nephrologist to see if they would like a fluid restriction added, since there currently is none. Pt does report that he is drinking the Nepro shakes and taking the Prosource TID. RD will continue to monitor PO intakes.   NUTRITION - FOCUSED PHYSICAL EXAM:  Flowsheet Row Most Recent Value  Orbital Region No depletion  Upper Arm Region Mild depletion  Thoracic and Lumbar Region Unable to assess  Buccal Region No depletion  Temple Region Mild depletion  Clavicle Bone Region No depletion  Clavicle and Acromion Bone Region No depletion  Scapular Bone Region No depletion  Dorsal Hand No depletion  Patellar  Region Moderate depletion  Anterior Thigh Region Moderate depletion  Posterior Calf Region Mild depletion  Edema (RD Assessment) Mild  Hair Reviewed  Eyes Reviewed  Mouth Reviewed  Skin Reviewed  Nails Reviewed       Diet Order:   Diet Order             DIET DYS 3 Room service appropriate? Yes; Fluid consistency: Thin; Fluid restriction: 1200 mL Fluid  Diet effective now                   EDUCATION NEEDS:   Education needs have been addressed  Skin:  Skin Assessment: Reviewed RN Assessment  Last BM:  7/14 - type 4  Height:   Ht Readings from Last 1 Encounters:  09/25/22 5\' 11"  (1.803 m)    Weight:   Wt Readings from Last 1 Encounters:  10/07/22 83.8 kg    Ideal Body Weight:  78.2 kg  BMI:  Body mass index is 25.77 kg/m.  Estimated Nutritional Needs:   Kcal:  2400-2600  Protein:  115-130 grams  Fluid:  1000 ml + UOP  Odarius Dines [place] Bing, RD, LDN, CNSC.

## 2022-10-08 ENCOUNTER — Ambulatory Visit: Payer: No Typology Code available for payment source | Admitting: Behavioral Health

## 2022-10-08 DIAGNOSIS — N186 End stage renal disease: Secondary | ICD-10-CM | POA: Diagnosis not present

## 2022-10-08 DIAGNOSIS — J9690 Respiratory failure, unspecified, unspecified whether with hypoxia or hypercapnia: Secondary | ICD-10-CM | POA: Diagnosis not present

## 2022-10-08 DIAGNOSIS — Z992 Dependence on renal dialysis: Secondary | ICD-10-CM | POA: Diagnosis not present

## 2022-10-08 DIAGNOSIS — N250 Renal osteodystrophy: Secondary | ICD-10-CM | POA: Diagnosis not present

## 2022-10-08 DIAGNOSIS — R197 Diarrhea, unspecified: Secondary | ICD-10-CM | POA: Diagnosis not present

## 2022-10-08 DIAGNOSIS — D631 Anemia in chronic kidney disease: Secondary | ICD-10-CM | POA: Diagnosis not present

## 2022-10-08 DIAGNOSIS — B9561 Methicillin susceptible Staphylococcus aureus infection as the cause of diseases classified elsewhere: Secondary | ICD-10-CM | POA: Diagnosis not present

## 2022-10-08 DIAGNOSIS — D649 Anemia, unspecified: Secondary | ICD-10-CM | POA: Diagnosis not present

## 2022-10-08 DIAGNOSIS — R7881 Bacteremia: Secondary | ICD-10-CM | POA: Diagnosis not present

## 2022-10-08 DIAGNOSIS — I120 Hypertensive chronic kidney disease with stage 5 chronic kidney disease or end stage renal disease: Secondary | ICD-10-CM | POA: Diagnosis not present

## 2022-10-08 DIAGNOSIS — R1319 Other dysphagia: Secondary | ICD-10-CM

## 2022-10-08 DIAGNOSIS — M4626 Osteomyelitis of vertebra, lumbar region: Secondary | ICD-10-CM | POA: Diagnosis not present

## 2022-10-08 LAB — BASIC METABOLIC PANEL
Anion gap: 10 (ref 5–15)
BUN: 30 mg/dL — ABNORMAL HIGH (ref 8–23)
CO2: 25 mmol/L (ref 22–32)
Calcium: 7.8 mg/dL — ABNORMAL LOW (ref 8.9–10.3)
Chloride: 92 mmol/L — ABNORMAL LOW (ref 98–111)
Creatinine, Ser: 6.04 mg/dL — ABNORMAL HIGH (ref 0.61–1.24)
GFR, Estimated: 10 mL/min — ABNORMAL LOW (ref >60)
Glucose, Bld: 108 mg/dL — ABNORMAL HIGH (ref 70–99)
Potassium: 3.7 mmol/L (ref 3.5–5.1)
Sodium: 127 mmol/L — ABNORMAL LOW (ref 135–145)

## 2022-10-08 LAB — PREPARE RBC (CROSSMATCH)

## 2022-10-08 LAB — CBC
HCT: 22.6 % — ABNORMAL LOW (ref 39.0–52.0)
Hemoglobin: 7 g/dL — ABNORMAL LOW (ref 13.0–17.0)
MCH: 25.2 pg — ABNORMAL LOW (ref 26.0–34.0)
MCHC: 31 g/dL (ref 30.0–36.0)
MCV: 81.3 fL (ref 80.0–100.0)
Platelets: 349 10*3/uL (ref 150–400)
RBC: 2.78 MIL/uL — ABNORMAL LOW (ref 4.22–5.81)
RDW: 16.1 % — ABNORMAL HIGH (ref 11.5–15.5)
WBC: 13.7 10*3/uL — ABNORMAL HIGH (ref 4.0–10.5)
nRBC: 0.4 % — ABNORMAL HIGH (ref 0.0–0.2)

## 2022-10-08 MED ORDER — HYDRALAZINE HCL 20 MG/ML IJ SOLN: 10.0000 mg | INTRAMUSCULAR | Status: DC | PRN

## 2022-10-08 MED ORDER — SODIUM CHLORIDE 0.9% IV SOLUTION: Freq: Once | INTRAVENOUS | Status: AC

## 2022-10-08 MED ORDER — PANTOPRAZOLE SODIUM 40 MG PO TBEC
40.0000 mg | DELAYED_RELEASE_TABLET | Freq: Two times a day (BID) | ORAL | Status: DC
  Administered 2022-10-08 – 2022-10-09 (×2): 40 mg via ORAL
  Filled 2022-10-08 (×2): qty 1

## 2022-10-08 MED ORDER — REVEFENACIN 175 MCG/3ML IN SOLN
175.0000 ug | Freq: Every day | RESPIRATORY_TRACT | Status: DC
  Administered 2022-10-08 – 2022-10-09 (×2): 175 ug via RESPIRATORY_TRACT
  Filled 2022-10-08 (×3): qty 3

## 2022-10-08 MED ORDER — METOPROLOL TARTRATE 5 MG/5ML IV SOLN
5.0000 mg | INTRAVENOUS | Status: DC | PRN
  Administered 2022-10-09: 5 mg via INTRAVENOUS
  Filled 2022-10-08 (×2): qty 5

## 2022-10-08 MED ORDER — CHLORHEXIDINE GLUCONATE CLOTH 2 % EX PADS
6.0000 | MEDICATED_PAD | Freq: Every day | CUTANEOUS | Status: DC
  Administered 2022-10-09: 6 via TOPICAL

## 2022-10-08 MED ORDER — ARFORMOTEROL TARTRATE 15 MCG/2ML IN NEBU
15.0000 ug | INHALATION_SOLUTION | Freq: Two times a day (BID) | RESPIRATORY_TRACT | Status: DC
  Administered 2022-10-08 – 2022-10-09 (×3): 15 ug via RESPIRATORY_TRACT
  Filled 2022-10-08 (×3): qty 2

## 2022-10-08 MED ORDER — IPRATROPIUM-ALBUTEROL 0.5-2.5 (3) MG/3ML IN SOLN: 3.0000 mL | RESPIRATORY_TRACT | Status: DC | PRN

## 2022-10-08 NOTE — Progress Notes (Signed)
[facility] Progress Note   Subjective:   Patient seen and examined at bedside.  Reports breathing better today but feels more tired.  Didn't sleep well last night.  Thought he would feel better after the transfusion yesterday.  Hgb improved to 7.0.  Denies CP, SOB, abdominal pain and n/v/d.    Objective Vitals:   10/08/22 0104 10/08/22 0433 10/08/22 0600 10/08/22 0837  BP: 97/61 111/69  (!) 103/59  Pulse: (!) 103 99  (!) 101  Resp: (!) 22 (!) 23  (!) 23  Temp: 98.9 F (37.2 C) 98.5 F (36.9 C)  99 F (37.2 C)  TempSrc:    Oral  SpO2: 95% 96%  94%  Weight:   82.9 kg   Height:       Physical Exam General:chronically ill appearing male in NAD Heart:RRR, no mrg Lungs:CTAB, nml WOB on 2L O2 via [place] Abdomen:soft, NTND Extremities:no LE edema Dialysis Access: [facility]   Filed Weights   10/07/22 1358 10/07/22 1822 10/08/22 0600  Weight: 83.8 kg 80.8 kg 82.9 kg    Intake/Output Summary (Last 24 hours) at 10/08/2022 1219 Last data filed at 10/07/2022 2245 Gross per 24 hour  Intake 805 ml  Output 2500 ml  Net -1695 ml    Additional Objective Labs: Basic Metabolic Panel: Recent Labs  Lab 10/04/22 0553 10/05/22 0531 10/05/22 2023 10/06/22 0637 10/08/22 0605  NA 128* 130* 129* 129* 127*  K 4.1 3.6 3.8 3.8 3.7  CL 93* 94* 89* 92* 92*  CO2 24 26 25 24 25   GLUCOSE 107* 106* 121* 100* 108*  BUN 39* 27* 36* 41* 30*  CREATININE 7.44* 5.79* 6.93* 7.89* 6.04*  CALCIUM 8.1* 8.2* 8.5* 8.2* 7.8*  PHOS 3.8 3.9  --  4.1  --    Liver Function Tests: Recent Labs  Lab 10/04/22 0553 10/05/22 0531 10/06/22 0637  ALBUMIN 1.7* 1.8* 1.6*    CBC: Recent Labs  Lab 10/02/22 1234 10/05/22 2023 10/06/22 0637 10/07/22 0548 10/08/22 0605  WBC 13.3* 14.7* 13.8* 12.9* 13.7*  NEUTROABS 11.0* 12.3* 11.1* 10.3*  --   HGB 8.5* 7.0* 6.3* 6.1* 7.0*  HCT 26.8* 22.8* 21.3* 20.0* 22.6*  MCV 84.0 83.2 83.5 82.6 81.3  PLT 352 344 321 358 349   Blood Culture    Component  Value Date/Time   SDES BLOOD BLOOD RIGHT ARM 10/05/2022 2032   SPECREQUEST  10/05/2022 2032    BOTTLES DRAWN AEROBIC AND ANAEROBIC Blood Culture adequate volume   CULT  10/05/2022 2032    NO GROWTH 3 DAYS Performed at [facility], [address], [place], [place] [address]    REPTSTATUS PENDING 10/05/2022 2032    Lab Results  Component Value Date   INR 1.4 (H) 09/10/2022   INR 1.13 09/23/2017   INR 1.01 08/28/2016    Medications:  piperacillin-tazobactam (ZOSYN)  IV 2.25 g (10/08/22 0622)    (feeding supplement) PROSource Plus  30 mL Oral TID BM   sodium chloride   Intravenous Once   arformoterol  15 mcg Nebulization BID   benzonatate  100 mg Oral BID   calcitRIOL  0.5 mcg Oral Q T,Th,Sat-1800   Chlorhexidine Gluconate Cloth  6 each Topical Q12H   cinacalcet  30 mg Oral Q supper   [START ON 10/09/2022] darbepoetin (ARANESP) injection - DIALYSIS  150 mcg Subcutaneous Q Thu-1800   feeding supplement (NEPRO CARB STEADY)  237 mL Oral TID BM   fluticasone  1 spray Each Nare Daily  guaiFENesin  600 mg Oral BID   loratadine  10 mg Oral Daily   methocarbamol  250 mg Oral BID   midodrine  10 mg Oral TID WC   multivitamin  1 tablet Oral QHS   pantoprazole  40 mg Oral BID AC   revefenacin  175 mcg Nebulization Daily   sucroferric oxyhydroxide  1,000 mg Oral TID WC   traZODone  150 mg Oral QHS    Dialysis Orders: NW TTS  3:45 450/A1.5x 2K/2Ca EDW 85.7kg TDC  -Heparin 3000 units IV TIW  -No ESA -Calcitriol 1.5 MCG PO three times per week   Assessment/Plan: 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 ID RX  Blood Cx 6/16 MSSA, negative Cx on 6/17, 6/19. TDC removed 6/18. Completed 72 hour line holiday. TTE concerning for endocarditis. TEE cancelled d/t high risk factors. LS MRI with possible L5-S1 discitis. Temp cath placed 6/21, but poor function requiring exchange -  Will be getting IV Cefazolin 2g q HD x 6 weeks (until  11/04/22). Deconditioning /weakness: in CIR GIB - Hgb dropping, +FOBT, GI consulting. Hold heparin with HD. Dialysis Access: RIJ TDC removed 6/18 - s/p line holiday, then temp line placed 6/21 with recurrent non-function issues despite exchange 6/26. S/p [facility] 7/1 in IR.  SBO - on CT Ab 6/19. Now resolved, surgery team has signed off. Hyperkalemia: resolved Hyponatremia: trending back down. Na 126 this a.m. managing with HD. Increase UF goal tomorrow. ESRD: Usual TTS schedule - then line issues preventing HD 6/27 -7/1. Now resolved and HD 7/1, 7/2 to correct.  Next HD Tuesday 10/07/2022 Perm Access: S/p L AVF ligation in 2019. Last seen by Dr. Edilia Bo 01/2022 - limited mobility in R arm so planning for L arm graft but had not scheduled surgery yet. Can revisit as an outpatient once bacteremia resolved.  Hypotension/volume: on midodrine. UF as tolerated.  SOB improved today. Getting under edw, will need lower EDW on discharge.  Slightly increased UF tomorrow as tolerated. Anemia of ESRD: Hgb 7.0 this AM s/p 1 unit pRBC yesterday.  +FOBT. continue Aranesp q Thursday, increased next dose to .   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 Binder changed to Velphoro now at goal. Increased dose to 2 tabs PO TID AC.   Nutrition - Renal diet, Alb low - continue supplements  Virgina Norfolk, PA-C [facility] 10/08/2022,12:19 PM  LOS: 13 days

## 2022-10-08 NOTE — Progress Notes (Signed)
Blood bank notified  to verify readiness of blood for transfusion,spoke with Johnny Bridge in blood bank ( [phone]) was informed that because the unit was is an emergency release additional documentation is needed from MD to verify releasing the unit of blood. On call Jesusita Oka, [place] informed and will follow up and return call.  2021 Notified by Jesusita Oka A.,PA,states documentation for blood release is completed and blood back to notify department when blood is available

## 2022-10-08 NOTE — Progress Notes (Signed)
PROGRESS NOTE   Subjective/Complaints:  LBM x2 yesterday Thinks has gained 20 lbs in last few weeks- we went over that needs to eat- because his protein stores are poor- and therefore, is retaining fluid in tissues and not blood vessels.   Also admitted to me- Told dititian yesterday- that cannot swllaow food- regular -diet- was siwtched to D3 diet.   Has MBSS tomorrow.   Has a scratchy throat and cough a little better, but not having difficulty breathing through nose anymore.   Let pt know he can get OOB with therapy, but if HR goes higher or feels more fatigued, let therapy know and can get back to bed.    ROS:   Pt denies SOB, abd pain, CP, N/V/C/D, and vision changes  Except for HPI  Objective:   No results found. Recent Labs    10/07/22 0548 10/08/22 0605  WBC 12.9* 13.7*  HGB 6.1* 7.0*  HCT 20.0* 22.6*  PLT 358 349   Recent Labs    10/06/22 0637 10/08/22 0605  NA 129* 127*  K 3.8 3.7  CL 92* 92*  CO2 24 25  GLUCOSE 100* 108*  BUN 41* 30*  CREATININE 7.89* 6.04*  CALCIUM 8.2* 7.8*    Intake/Output Summary (Last 24 hours) at 10/08/2022 1737 Last data filed at 10/08/2022 0900 Gross per 24 hour  Intake 755 ml  Output 2500 ml  Net -1745 ml         Physical Exam: Vital Signs Blood pressure 123/65, pulse 99, temperature 97.8 F (36.6 C), temperature source Oral, resp. rate (!) 21, height 5\' 11"  (1.803 m), weight 82.9 kg, SpO2 99%.     General: awake, alert, appropriate, apears more fatigued/depressed?, NAD HENT: conjugate gaze; oropharynx moist CV: regular rhythm, tachycardic (100s) rate; no JVD Pulmonary: decreased throughout- maybe a few wheezes, no R/R GI: soft, NT, (+)BS- appears more protuberant Psychiatric: appropriate- appears more depressed vs fatigued Neurological: Ox3 More awake/alert today, more interactive, but not bright affect No LE edema seen B/L But abdomen very  protuberant- fluid? PRIOR EXAMS: Skin: + L upper chest HD catheter - dressing c/d/i + Nonpainful erythematous lesions on bilateral feet  MSK:      No apparent deformity.      Strength: Exam limited d/t being on HD; all 4 limbs equal, antigravity and against resistance   Neurologic exam:  Cognition: AAO to person, place, time and event.  + Mild lethargy - intermittently falling asleep during late dialysis; more awake in early exam Language: Fluent, No substitutions or neoglisms. No dysarthria. Names 3/3 objects correctly.  Memory: Recalls 3/3 objects at 5 minutes. No apparent deficits  Insight: Good  insight into current condition.  Mood: Pleasant affect, appropriate mood.  Sensation: To light touch intact in BL UEs and LEs  Reflexes: 1+ in BL UE and LEs. Negative Hoffman's and babinski signs bilaterally.  CN: 2-12 grossly intact.  Coordination: No apparent tremors. No ataxia on FTN, HTS bilaterally.  Spasticity: MAS 0 in all extremities.       Assessment/Plan: 1. Functional deficits which require 3+ hours per day of interdisciplinary therapy in a comprehensive inpatient rehab setting. Physiatrist is providing close team  supervision and 24 hour management of active medical problems listed below. Physiatrist and rehab team continue to assess barriers to discharge/monitor patient progress toward functional and medical goals  Care Tool:  Bathing    Body parts bathed by patient: Right arm, Left arm, Chest, Abdomen, Front perineal area, Buttocks, Right upper leg, Left upper leg, Right lower leg, Left lower leg, Face         Bathing assist Assist Level: Set up assist     Upper Body Dressing/Undressing Upper body dressing   What is the patient wearing?: Button up shirt    Upper body assist Assist Level: Minimal Assistance - Patient > 75%    Lower Body Dressing/Undressing Lower body dressing      What is the patient wearing?: Underwear/pull up     Lower body assist  Assist for lower body dressing: Minimal Assistance - Patient > 75%     Toileting Toileting    Toileting assist Assist for toileting: Supervision/Verbal cueing     Transfers Chair/bed transfer  Transfers assist     Chair/bed transfer assist level: Supervision/Verbal cueing     Locomotion Ambulation   Ambulation assist      Assist level: Minimal Assistance - Patient > 75% Assistive device: Rollator Max distance: 170'   Walk 10 feet activity   Assist     Assist level: Minimal Assistance - Patient > 75% Assistive device: Walker-rolling   Walk 50 feet activity   Assist    Assist level: Minimal Assistance - Patient > 75% Assistive device: Walker-rolling    Walk 150 feet activity   Assist    Assist level: Minimal Assistance - Patient > 75% Assistive device: Walker-rolling    Walk 10 feet on uneven surface  activity   Assist Walk 10 feet on uneven surfaces activity did not occur: Safety/medical concerns         Wheelchair     Assist Is the patient using a wheelchair?: Yes Type of Wheelchair: Manual    Wheelchair assist level: Dependent - Patient 0% (pain limits)      Wheelchair 50 feet with 2 turns activity    Assist        Assist Level: Dependent - Patient 0%   Wheelchair 150 feet activity     Assist      Assist Level: Dependent - Patient 0%   Blood pressure 123/65, pulse 99, temperature 97.8 F (36.6 C), temperature source Oral, resp. rate (!) 21, height 5\' 11"  (1.803 m), weight 82.9 kg, SpO2 99%.  Medical Problem List and Plan: 1. Functional deficits secondary to lumbar osteomyelitis/diskitis d/t MSSA bacteremia.heart vegetations/Endocarditis              -patient may shower             -ELOS/Goals: PT/OT/SLP supervision to mod I, 10-14 days  -d/c 7/19  Only bed level therapy- will not d/c 7/19 unless pops back up after transfusion  Changed d/c to 7/24-   Con't CIR PT and OT- can get OOB, but be careful- let  therapy know 2.  Antithrombotics: -DVT/anticoagulation:  Pharmaceutical: Heparin 5000 q8h             -antiplatelet therapy: N/A 3. Low back pain/Pain Management: tylenol, Oxycodone prn. Advised to use oxycodone prior to sleep --will schedule low dose robaxin 250mg  bid as reports ongoing lower back and LE spasms.  -7/5- will increase Oxy to 10-15 mg q4 hours prn-can add long acting over weekend if necessary -7/6-7/24 pain seems  controlled for now, monitor 7/12- pain doing much better at 7.5-10 mg q4 hours prn -7/14-7/16pain controlled overall 4. Mood/Behavior/Sleep: LCSW to follow for evaluation and support.              -antipsychotic agents: N/A --acute on chronic insomnia in part due to anxiety. Will schedule low dose trazodone. Continue prn melatonin -7/5- will change trazodone to 75 mg at bedtime- and can be titrated more over weekend if needed.  7/9- sleeping well  7/10- didn't sleep well- will increase trazodone to 150 mg at bedtime 7/12- slept somewhat better- con't trazodone -10/05/22 sleeping better 7/17- poor sleep- will d/w pt more if occurs again tonight 5. Neuropsych/cognition: This patient is capable of making decisions on his own behalf. 6. Skin/Wound Care: Routine pressure relief measures -continue to monitor new lesions on bilateral feet for evolution/progress   7. Fluids/Electrolytes/Nutrition: Strict I/O. Daily weights. Labs TTS with HD. Add low salt restrictions             --continue Nephro supplements 8. 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  --IV cefazolin with HD. EOT 11/04/22              --Fevers have resolved. CRP-44/Sed rate 1.5 @ admission.  --Leucocytosis trending down from 23.9-->31.4-->13.9.    -7/5- Pt's WBC down to 13/9- will recheck labs in AM as well as qmonday -09/27/22 WBC 13.2, cont to monitor 7/8- WBC stable at 13k- CRP  17.3 and ESR >140- will call ID- they said ESR not real helpful in ESRD pt's- so to make sure CRP improving and pt looks good, to call them back before pt leaves.  Called, since ESR rising- last checked 3 weeks ago and WBC won't resolve. 7/9- ID ok with labs since ESR not a good indicator in ESRD- follow labs weekly.   7/12- will call ID before leaves rehab 7/15- CRP slightly up to 20 and ESR down to 100 7/17- on Zosyn for now due to pneumonia, but will be switched back to Cefazolin in 5 days? 9. SBO w/ enteritis: Has resolved. Surgery team signed off 10. ESRD: HD TTS at the end of the day to help with tolerance of therapy.              --strict I/O with daily weights. Continues to require 4 L oxygen per [place].  --On Liberalized diet but 1200 cc FR/4 gram salt.  Velphoro for elevated phos level. -7/5- Na down to 128- per Renal  -09/27/22 dialysis today 7/9- K+_ 5.3- this AM and Na 127- per renal to intervene- they will likely check labs and follow up 7/10- K+ up, but then held HD yesterday. Na 126-  7/11- Na 126 and K_ 5.2 still- also d/w pt with renal about midodrine- per renal.  7/12- Na up to 128-K+ 4.0 7/15- Na 129- K+ 3.8 11. Anemia of chronic disease: On Aranesp weekly every Thursday w/HD.   7/12- Hb 8.5- better than was of 7.8-8.2  7/15- Hb down to 6.3- concern for GI bleed  7/16- No BM yesterday- feels constipated- so couldn't get FOBT done- will do sorbitol and ask nursing to check FOBT  7/17- FOBT (+)_ called GI consult- ordered 2nd unit since at 7.0.  12. HTN: Monitor BP TID. On low dose tenormin 12.5mg  every day but BP with drops during HD.  -7/5- BP running low 90s systolic when laying down- I'm concerned will  have orthostatic hypotension-will check with PT/OT  -09/27/22 BPs soft, monitor closely -09/28/22 BP a little softer today, hold atenolol this morning, monitor to see if changes need to be made to that. Denies orthostatic symptoms.  7/8- held atenolol- BP dropped to 60-70's  systolic yesterday- was 90s SBP this AM- will monitor- midodrine increased yesterday to 2.5 mg 7/9- will hold atenolol since BP 104 systolic and going to HD today.   7/10- stopped Atenolol and increased Midodrine to 5 mg BID with breakfast/lunch 7/11- increased midodrine to 5 mg TID- but think he might benefit from 10 mg since BP has been running 70s with sitting/standing with therapy.  7/12- BP running 80's this AM- Midodrine up 5-10 mg TID- depending on HD days- asymptomatic as usual- will d/w therapy again.  7/15- BP low, but likely due to ABLA 7/17- BP doing better- not so low- likely due to transfusion Vitals:   10/07/22 1700 10/07/22 1730 10/07/22 1800 10/07/22 1806  BP: 114/66 113/65 119/71 120/69   10/07/22 1822 10/07/22 1946 10/07/22 2030 10/08/22 0104  BP: 122/71 119/65 (!) 110/59 97/61   10/08/22 0433 10/08/22 0837 10/08/22 1236 10/08/22 1638  BP: 111/69 (!) 103/59 105/60 123/65    13. AHRF d/t cavitary pneumonia. Wean oxygen as able. Currently 2-4L; per patient not using CPAP/BiPAP   -7/5- will wean O2 as allowed  -09/27/22 no O2 on today, monitor  -10/04/22 needing O2 last night, doing well today, monitor 14. Constipation:  -09/28/22 had a small BM yesterday, lots of gas, will do dose of miralax now then add on QD PRN dose, advised to take senna with the miralax dose now, then could use the PRN miralax dose after lunch if still not successful-- if not working, then use suppository +/- enema.  7/8- LBM  7/6- small- feels like needs to go- wants to wait on more intervention.   7/9- Had good sized BM yesterday- feeling better  7/10- bowels "going ok".   -10/05/22 LBM this morning, doing well, monitor  7/16- feels constipated- LBM 2 days ago- will give sorbitol now-   7/17- 2 Bms yesterday- was FOBT (+) 15. New cough/mild SOB  7/11- got CXR_ concern about fluid overload/heart decline in working? Due to enlarged cardiac silhouette and appears like effusion/possible infiltrate- Called IM  consult to address- CXR read pending.   7/12- CXR shows more atelectasis- will order flutter valve and tessalon pearls 100 mg BID for cough. Pt reports still bothersome   -10/04/22 cough better, needing O2 today but feels ok; hopefully dialysis today will help with volume; monitor  -10/05/22 no O2 today, somewhat shallow breathing but not really increased WOB; monitor closely  7/15- will order albuterol nebs scheduled for 2 days then PRN and cough meds with codeine.   7/16- says albuterol has made "a world of difference"- in cough/breathing- instant improvement  7/17- down to 2 L O2 by [place]-  16. Severe protein Malnutrition  7/12- Alb down to 1.6- will d/w team about what to add for appetite stimulation- thinking Remeron?will order dietitian consult.   7/16- will order another dietitian consult.  17. Tachycardia:  -10/05/22 tachycardic since dialysis yesterday, but completely asymptomatic-- maybe from midodrine? Recently increased. Monitor closely, doubt need for urgent intervention or work up right now  7/15- likely due to low Hb.   7/16- still going on, however has Hb of 6.1 this AM- hopefully will improve when gets blood.   7/17- about the same- Hb up to 7.0   I  spent a total of 56   minutes on total care today- >50% coordination of care- due to  Review of GI note and IM note and Renal note- also called consult to GI due to possible GI bleed- I also spoke with nursing about second blood trnasfusion/unit- trying to get that.    LOS: 13 days A FACE TO FACE EVALUATION WAS PERFORMED  Ryan [name] 10/08/2022, 5:37 PM

## 2022-10-08 NOTE — Progress Notes (Signed)
PROGRESS NOTE    Ryan [name]  JYN:[id number] DOB: 05-01-1960 DOA: 09/25/2022 PCP: Sharin Grave, MD   Brief Narrative:  62 y.o. male with past medical history of hypertension, hyperlipidemia, and ESRD on hemodialysis who initially presented to the hospital on 6/6 with complaints of low back pain ultimately found to have evidence of L5/S1 discitis and osteomyelitis as well as MSSA bacteremia with mitral valve endocarditis. CT scan of the chest noted septic emboli with new airspace opacities in the left upper lobe . MRI brain noting scattered subcentimeter infarcts involving the bilateral cerebral hemisphere and right cerebellum. CT surgery has been consulted but recommended conservative management as. He was already responding to antibiotics. Plan was for patient to continue on antibiotics at cefazolin until 8/13 per ID recommendations. Hospital course had also been complicated by small bowel obstruction which has since resolved. Patient had been discharged to inpatient rehab on 7/4.   Medical team consulted for intermittent cough and possibly choking on food.  Chest x-ray had poor inspiratory effort.  Labs showed hyponatremia.   Assessment & Plan:  Principal Problem:   Acute osteomyelitis of lumbar spine (HCC) Active Problems:   Leukocytosis   Cough   ESRD on dialysis (HCC)   Hyperkalemia   Diskitis      Acute hypoxemic respiratory failure -Continue to wean off oxygen, currently on 2 L nasal cannula.  RVP and COVID-19 negative.  Procalcitonin 5.04.  Currently on empiric Zosyn, will plan total of 5 days thereafter transition back to cefazolin.  I-S/flutter valve.  Order bronchodilators scheduled and as needed. -Speech and swallow following-D3 diet   Anemia of chronic disease -Baseline hemoglobin around 8.0, slowly drifted down to 6.1 over several days.  Hemoccult positive.  Status post 1 unit PRBC 7/15.  Increase PPI to twice daily AC.  GI following   Tachycardia -Question if he  had A-fib during dialysis 7/13  -EKG reviewed independently shows sinus tachycardia with rate 105   Other medical issues including: L5/S1 discitis and osteomyelitis, MSSA bacteremia with mitral valve endocarditis, septic emboli to the lungs, septic emboli to the brain Persistent leukocytosis in setting of infection as above ESRD on dialysis with chronic hyponatremia Anemia of chronic disease Small bowel obstruction, resolved -Plan cefazolin until 8/13 (FOR NOW ON ZOSYN FOR 5 DAYS).  Nephro following for dialysis.         Diet Orders (From admission, onward)     Start     Ordered   10/07/22 1527  DIET DYS 3 Room service appropriate? Yes; Fluid consistency: Thin; Fluid restriction: 1200 mL Fluid  Diet effective now       Question Answer Comment  Room service appropriate? Yes   Fluid consistency: Thin   Fluid restriction: 1200 mL Fluid      10/07/22 1527            Subjective:  Seen at bedside, tells me his breathing is much better today.  Examination:  General exam: Appears calm and comfortable  Respiratory system: Clear to auscultation. Respiratory effort normal. Cardiovascular system: S1 & S2 heard, RRR. No JVD, murmurs, rubs, gallops or clicks. No pedal edema. Gastrointestinal system: Abdomen is nondistended, soft and nontender. No organomegaly or masses felt. Normal bowel sounds heard. Central nervous system: Alert and oriented. No focal neurological deficits. Extremities: Symmetric 5 x 5 power. Skin: No rashes, lesions or ulcers Psychiatry: Judgement and insight appear normal. Mood & affect appropriate.  Objective: Vitals:   10/08/22 0104 10/08/22 0433 10/08/22 0600 10/08/22 8657  BP: 97/61 111/69  (!) 103/59  Pulse: (!) 103 99  (!) 101  Resp: (!) 22 (!) 23  (!) 23  Temp: 98.9 F (37.2 C) 98.5 F (36.9 C)  99 F (37.2 C)  TempSrc:    Oral  SpO2: 95% 96%  94%  Weight:   82.9 kg   Height:        Intake/Output Summary (Last 24 hours) at 10/08/2022  0848 Last data filed at 10/07/2022 2245 Gross per 24 hour  Intake 805 ml  Output 2500 ml  Net -1695 ml   Filed Weights   10/07/22 1358 10/07/22 1822 10/08/22 0600  Weight: 83.8 kg 80.8 kg 82.9 kg    Scheduled Meds:  (feeding supplement) PROSource Plus  30 mL Oral TID BM   sodium chloride   Intravenous Once   albuterol  2.5 mg Nebulization TID   benzonatate  100 mg Oral BID   calcitRIOL  0.5 mcg Oral Q T,Th,Sat-1800   Chlorhexidine Gluconate Cloth  6 each Topical Q12H   cinacalcet  30 mg Oral Q supper   [START ON 10/09/2022] darbepoetin (ARANESP) injection - DIALYSIS  150 mcg Subcutaneous Q Thu-1800   feeding supplement (NEPRO CARB STEADY)  237 mL Oral TID BM   fluticasone  1 spray Each Nare Daily   guaiFENesin  600 mg Oral BID   loratadine  10 mg Oral Daily   methocarbamol  250 mg Oral BID   midodrine  10 mg Oral TID WC   multivitamin  1 tablet Oral QHS   pantoprazole  40 mg Oral Daily   sucroferric oxyhydroxide  1,000 mg Oral TID WC   traZODone  150 mg Oral QHS   Continuous Infusions:  piperacillin-tazobactam (ZOSYN)  IV 2.25 g (10/08/22 0622)    Nutritional status Signs/Symptoms: meal completion < 25%, per patient/family report Interventions: Nepro shake, MVI, Prostat, Liberalize Diet Body mass index is 25.49 kg/m.  Data Reviewed:   CBC: Recent Labs  Lab 10/02/22 1234 10/05/22 2023 10/06/22 0637 10/07/22 0548 10/08/22 0605  WBC 13.3* 14.7* 13.8* 12.9* 13.7*  NEUTROABS 11.0* 12.3* 11.1* 10.3*  --   HGB 8.5* 7.0* 6.3* 6.1* 7.0*  HCT 26.8* 22.8* 21.3* 20.0* 22.6*  MCV 84.0 83.2 83.5 82.6 81.3  PLT 352 344 321 358 349   Basic Metabolic Panel: Recent Labs  Lab 10/02/22 1234 10/03/22 0629 10/04/22 0553 10/05/22 0531 10/05/22 2023 10/06/22 0637 10/08/22 0605  NA 128* 128* 128* 130* 129* 129* 127*  K 5.3* 4.0 4.1 3.6 3.8 3.8 3.7  CL 85* 92* 93* 94* 89* 92* 92*  CO2 24 23 24 26 25 24 25   GLUCOSE 109* 87 107* 106* 121* 100* 108*  BUN 61* 27* 39* 27* 36*  41* 30*  CREATININE 9.09* 5.67* 7.44* 5.79* 6.93* 7.89* 6.04*  CALCIUM 8.9 7.8* 8.1* 8.2* 8.5* 8.2* 7.8*  PHOS 4.5 3.8 3.8 3.9  --  4.1  --    GFR: Estimated Creatinine Clearance: 13.7 mL/min (A) (by C-G formula based on SCr of 6.04 mg/dL (H)). Liver Function Tests: Recent Labs  Lab 10/02/22 1234 10/03/22 0629 10/04/22 0553 10/05/22 0531 10/06/22 0637  ALBUMIN 2.1* 1.6* 1.7* 1.8* 1.6*   No results for input(s): "LIPASE", "AMYLASE" in the last 168 hours. No results for input(s): "AMMONIA" in the last 168 hours. Coagulation Profile: No results for input(s): "INR", "PROTIME" in the last 168 hours. Cardiac Enzymes: No results for input(s): "CKTOTAL", "CKMB", "CKMBINDEX", "TROPONINI" in the last 168 hours. BNP (last 3 results)  No results for input(s): "PROBNP" in the last 8760 hours. HbA1C: No results for input(s): "HGBA1C" in the last 72 hours. CBG: No results for input(s): "GLUCAP" in the last 168 hours. Lipid Profile: No results for input(s): "CHOL", "HDL", "LDLCALC", "TRIG", "CHOLHDL", "LDLDIRECT" in the last 72 hours. Thyroid Function Tests: No results for input(s): "TSH", "T4TOTAL", "FREET4", "T3FREE", "THYROIDAB" in the last 72 hours. Anemia Panel: No results for input(s): "VITAMINB12", "FOLATE", "FERRITIN", "TIBC", "IRON", "RETICCTPCT" in the last 72 hours. Sepsis Labs: Recent Labs  Lab 10/02/22 1234 10/05/22 2023 10/05/22 2257  PROCALCITON 5.26 5.04  --   LATICACIDVEN  --  1.3 1.0    Recent Results (from the past 240 hour(s))  Culture, blood (Routine X 2) w Reflex to ID Panel     Status: None (Preliminary result)   Collection Time: 10/05/22  8:23 PM   Specimen: BLOOD  Result Value Ref Range Status   Specimen Description BLOOD BLOOD RIGHT HAND  Final   Special Requests   Final    BOTTLES DRAWN AEROBIC AND ANAEROBIC Blood Culture adequate volume   Culture   Final    NO GROWTH 2 DAYS Performed at [facility], [address], [place], [place] [address]     Report Status PENDING  Incomplete  Culture, blood (Routine X 2) w Reflex to ID Panel     Status: None (Preliminary result)   Collection Time: 10/05/22  8:32 PM   Specimen: BLOOD  Result Value Ref Range Status   Specimen Description BLOOD BLOOD RIGHT ARM  Final   Special Requests   Final    BOTTLES DRAWN AEROBIC AND ANAEROBIC Blood Culture adequate volume   Culture   Final    NO GROWTH 2 DAYS Performed at [facility], [address] [place], [place] [address]    Report Status PENDING  Incomplete  Respiratory (~20 pathogens) panel by PCR     Status: None   Collection Time: 10/06/22  1:58 AM   Specimen: Nasopharyngeal Swab; Respiratory  Result Value Ref Range Status   Adenovirus NOT DETECTED NOT DETECTED Final   Coronavirus 229E NOT DETECTED NOT DETECTED Final    Comment: (NOTE) The Coronavirus on the Respiratory Panel, DOES NOT test for the novel  Coronavirus (2019 nCoV)    Coronavirus HKU1 NOT DETECTED NOT DETECTED Final   Coronavirus NL63 NOT DETECTED NOT DETECTED Final   Coronavirus OC43 NOT DETECTED NOT DETECTED Final   Metapneumovirus NOT DETECTED NOT DETECTED Final   Rhinovirus / Enterovirus NOT DETECTED NOT DETECTED Final   Influenza A NOT DETECTED NOT DETECTED Final   Influenza B NOT DETECTED NOT DETECTED Final   Parainfluenza Virus 1 NOT DETECTED NOT DETECTED Final   Parainfluenza Virus 2 NOT DETECTED NOT DETECTED Final   Parainfluenza Virus 3 NOT DETECTED NOT DETECTED Final   Parainfluenza Virus 4 NOT DETECTED NOT DETECTED Final   Respiratory Syncytial Virus NOT DETECTED NOT DETECTED Final   Bordetella pertussis NOT DETECTED NOT DETECTED Final   Bordetella Parapertussis NOT DETECTED NOT DETECTED Final   Chlamydophila pneumoniae NOT DETECTED NOT DETECTED Final   Mycoplasma pneumoniae NOT DETECTED NOT DETECTED Final    Comment: Performed at [facility], [address], [place], [place] [address]  SARS Coronavirus 2 by RT PCR (hospital order, performed  in [facility] hospital lab) *cepheid single result test* Anterior Nasal Swab     Status: None   Collection Time: 10/06/22  1:58 AM   Specimen: Anterior Nasal  Swab  Result Value Ref Range Status   SARS Coronavirus 2 by RT PCR NEGATIVE NEGATIVE Final    Comment: Performed at [facility], [address] [place], [place] [address]         Radiology Studies: No results found.         LOS: 13 days   Time spent= 35 mins    Kateland Leisinger Joline Maxcy, MD [facility]  If 7PM-7AM, please contact night-coverage  10/08/2022, 8:48 AM

## 2022-10-08 NOTE — Progress Notes (Signed)
Physical Therapy Session Note  Patient Details  Name: Ryan [name] MRN: [id number] Date of Birth: [birth year]  Today's Date: 10/08/2022 PT Individual Time: 1345-1430 PT Individual Time Calculation (min): 45 min   Short Term Goals: Week 2:  PT Short Term Goal 1 (Week 2): STG's=LTG's due to ELOS  Skilled Therapeutic Interventions/Progress Updates:      Therapy Documentation Precautions:  Precautions Precautions: Fall Restrictions Weight Bearing Restrictions: No  Pt received on 1 L of O2 in good spirits and agreeable to PT session. Pt without reports of pain. Pt dependently transported for time management and energy conservation to main gym. Throughout session, PT assessed vitals with SPO2 >93 on 1 L of O2. Pt provided rest breaks and educated on breathing techniques for energy conservation. Pt performed following exercises to address UE range of motion and strength deficits:   -bicep curls tidal tank 3 x 10   -R shoulder ER 1.5 kg weight 3 x 10   - R shoulder abduction 1 x 10   Pt returned to room and agreeable to sit up in chair at bedside. Pt left in care of NT with alarm on.     Therapy/Group: Individual Therapy  Truitt Leep Truitt Leep PT, DPT  10/08/2022, 6:29 AM

## 2022-10-08 NOTE — Progress Notes (Signed)
Occupational Therapy Session Note  Patient Details  Name: Ryan [name] MRN: [id number] Date of Birth: [birth year]  Today's Date: 10/08/2022 OT Individual Time: 4401-0272 OT Individual Time Calculation (min): 29 min  and Today's Date: 10/08/2022 OT Missed Time: -16 Minutes Missed Time Reason: Other (comment) (MD visit)   Short Term Goals: Week 2:  OT Short Term Goal 1 (Week 2): STG=LTG d/t ELOS  Skilled Therapeutic Interventions/Progress Updates:      Therapy Documentation Precautions:  Precautions Precautions: Fall Restrictions Weight Bearing Restrictions: No General:  "I am still so tired." Pt supine in bed upon OT arrival, agreeable to OT session. Pt with increased fatigue this date, noted lessened SOB during activity.   Vital Signs: 101/60 BP, 96% SpO2 and 103 bpm supine at start of session, then seated EOB was 113/71 BP, 95% SpO2 and 105 bpm.   Pain: 0/10 reported/noted   ADL: Bed mobility: SBA supine><EOB using bed rails, SBA scooting in bed in supine with use of bed rails Eating: set-up with VC for correct eating/drinking posture in order to prevent aspiration Grooming: SBA seated unsupported EOB to wash face, total A for lotion on feet d/t increased dryness Oral hygiene: SBA seated unsupported EOB to brush teeth, able to manipulate containers Footwear: total A for donning/doffing socks   Exercises: Pt completed the following exercise circuit in order to improve functional activity, strength and endurance to prepare for ADLs such as bathing. Pt completed the following exercises in seated EOB position with no noted LOB/SOB and 3x20 repetitions on each exercise: -seated marches   Other Treatments:  Pt educated on aspiration and how to prevent it. Pt educated on correct posture when eating, sitting up during meals and making sure food is chewed thoroughly before swallowing. Pt educated on upkeep with oral hygiene to prevent bacteria build up in oral cavity to prevent  bacterial issues.   Pt supine in bed with bed alarm activated, 2 bed rails up, call light within reach and 4Ps assessed. Handoff to MD.    Therapy/Group: Individual Therapy Velia Meyer, OTD, OTR/L 10/08/2022, 12:12 PM

## 2022-10-08 NOTE — Progress Notes (Signed)
Emergency release of the least incompatibility thenotypically matched authorization to transfuse the 1 unit of blood

## 2022-10-08 NOTE — Progress Notes (Unsigned)
                Victoria L Winstead, LMFT 

## 2022-10-08 NOTE — Progress Notes (Signed)
Patient arrived post dialysis. Blood transfusion continues without complications. No acute distress.

## 2022-10-08 NOTE — Plan of Care (Signed)
  Problem: Consults Goal: RH GENERAL PATIENT EDUCATION Description: See Patient Education module for education specifics. Outcome: Progressing   Problem: RH BOWEL ELIMINATION Goal: RH STG MANAGE BOWEL WITH ASSISTANCE Description: STG Manage Bowel with min Assistance. Outcome: Progressing   Problem: RH BLADDER ELIMINATION Goal: RH STG MANAGE BLADDER WITH ASSISTANCE Description: STG Manage Bladder With min Assistance Outcome: Progressing   Problem: Education: Goal: Knowledge of General Education information will improve Description: Including pain rating scale, medication(s)/side effects and non-pharmacologic comfort measures Outcome: Progressing   Problem: Health Behavior/Discharge Planning: Goal: Ability to manage health-related needs will improve Outcome: Progressing   Problem: Clinical Measurements: Goal: Ability to maintain clinical measurements within normal limits will improve Outcome: Progressing Goal: Will remain free from infection Outcome: Progressing Goal: Diagnostic test results will improve Outcome: Progressing Goal: Respiratory complications will improve Outcome: Progressing Goal: Cardiovascular complication will be avoided Outcome: Progressing   Problem: Activity: Goal: Risk for activity intolerance will decrease Outcome: Progressing   Problem: Nutrition: Goal: Adequate nutrition will be maintained Outcome: Progressing   Problem: Coping: Goal: Level of anxiety will decrease Outcome: Progressing   Problem: Elimination: Goal: Will not experience complications related to bowel motility Outcome: Progressing Goal: Will not experience complications related to urinary retention Outcome: Progressing   Problem: Pain Managment: Goal: General experience of comfort will improve Outcome: Progressing   Problem: Safety: Goal: Ability to remain free from injury will improve Outcome: Progressing   Problem: Skin Integrity: Goal: Risk for impaired skin  integrity will decrease Outcome: Progressing

## 2022-10-08 NOTE — Patient Care Conference (Signed)
Inpatient RehabilitationTeam Conference and Plan of Care Update Date: 10/08/2022   Time: 11:33 AM    Patient Name: Ryan [name]      Medical Record Number: [id number]  Date of Birth: 02/05/1961 Sex: Male         Room/Bed: 6Y40H/4V42V-95 Payor Info: Payor: [employer] / Plan: [employer] - [place] / Product Type: *No Product type* /    Admit Date/Time:  09/25/2022  4:32 PM  Primary Diagnosis:  Acute osteomyelitis of lumbar spine [facility])  Hospital Problems: Principal Problem:   Acute osteomyelitis of lumbar spine (HCC) Active Problems:   ESRD on dialysis (HCC)   Cough   Hyperkalemia   Diskitis   Leukocytosis    Expected Discharge Date: Expected Discharge Date: 10/15/22 (extended)  Team Members Present: Physician leading conference: Dr. Genice Rouge Social Worker Present: Dossie Der, LCSW Nurse Present: Vedia Pereyra, RN PT Present: Truitt Leep, PT OT Present: Velia Meyer, OT SLP Present: Feliberto Gottron, SLP PPS Coordinator present : Fae Pippin, SLP     Current Status/Progress Goal Weekly Team Focus  Bowel/Bladder   Patient is continent of B/B. Dialysis patient with oliguria   maintain contience   toilet patient q shift as needed    Swallow/Nutrition/ Hydration               ADL's   SBA UB ADLs, CGA LB ADLs with AE, CGA functional transfers with rollator   Mod I   reinforcement education for AE during LB ADLs, reinforcing correct breathing techniques for increased O2    Mobility   CGA - Supervision bed, transfers (RW/rollator), gait > 250 ft and curb step (4 inches)   Mod I  activity tolerance and rollator management    Communication                Safety/Cognition/ Behavioral Observations  mod I for identifying medication errors and completion of calendar tasks   mod I   complex problem solving tasks, memory recall w/ use of compensatory strategies    Pain   Patient complains of lower back pain   pain of <3 on 0-10 pain scale    Assess patient's pain q shift and PRN    Skin   Skin is intact   maintain skin integrity  assess patient's skin qshift and PRN      Discharge Planning:  Niece to come and work from home and be there for him. Medical issues limiting him in therapies. family to transport to OP-HD   Team Discussion: Acute osteomyelitis of lumbar spine.  HD T/Th/S.  IV ABX changed to Zosyn 2.25g every 8 hours. Hbg trending downward from 6.3 to 6.1.  Constipation. Oliguric. Currently 2L via N/C.  ? Aspiration PNU. Will try to ween oxygen prior to discharge. Daily weight. Therapy 15/7 but currently at bed level due to medical instability.  Patient on target to meet rehab goals: yes, patient on track to meet goals with discharge extended due to medical issues 10/15/22.   *See Care Plan and progress notes for long and short-term goals.   Revisions to Treatment Plan:  Blood to be administered today.  Order occult blood card.  Sorbitol added.  Teaching Needs: Medications. Safety, self care, gait/transfer training, etc.  Current Barriers to Discharge: IV antibiotics, Lack of/limited family support, Hemodialysis, and New oxygen  Possible Resolutions to Barriers: Family education Family providing transport to dialysis. Room air Order recommended DME     Medical Summary Current Status: Hb down to 6.1-  lost line yesterday AM- was infiltrated- pain 6/10- more tolerable- renal/HD- swollen all over due to Alb 1.6-  Barriers to Discharge: Infection/IV Antibiotics;Medical stability;Self-care education;Volume Overload;Renal Insufficiency/Failure;Inadequate Nutritional Intake;Hypotension;Oxygen Requirement  Barriers to Discharge Comments: going home on IV ABX during HD; last therapy was Sat- was CGA- now Hb 6.1- needs blood- is ordered to give today- because had Ab, couldn't get yesterday Possible Resolutions to [employer] Focus: on Zosyn for pneumonia; seeing for cognition- Supervision to mod I- on O2- d/c 7/24-  HD on T/H/S so cannot d/c Tuesday- working ontreating discitis AND pneumonia   Continued Need for Acute Rehabilitation Level of Care: The patient requires daily medical management by a physician with specialized training in physical medicine and rehabilitation for the following reasons: Direction of a multidisciplinary physical rehabilitation program to maximize functional independence : Yes Medical management of patient stability for increased activity during participation in an intensive rehabilitation regime.: Yes Analysis of laboratory values and/or radiology reports with any subsequent need for medication adjustment and/or medical intervention. : Yes   I attest that I was present, lead the team conference, and concur with the assessment and plan of the team.   Jearld Adjutant 10/08/2022, 8:49 AM

## 2022-10-08 NOTE — Consult Note (Addendum)
Referring Provider: Dr. Genice Rouge  Primary Care Physician:  Sharin Grave, MD Primary Gastroenterologist:  Gentry Fitz   Reason for Consultation: Anemia, positive FOBT  HPI: Ryan [name] is a 62 y.o. male with a past medical history of hypertension, hyperlipidemia, ESRD s/p failed kidney transplant 2010 on HD, chronic anemia and lower GI bleed 02/2019.  He was initially admitted to the hospital 08/28/2022 with lower back pain subsequently diagnosed with L5/S1 discitis and osteomyelitis as well as MSSA bacteremia with mitral valve endocarditis. Chest CT identified septic emboli with new airspace opacities in the left upper lobe . MRI brain showed scattered subcentimeter infarcts involving the bilateral cerebral hemisphere and right cerebellum.  He was evaluated by ID and placed on IV cefazolin until 8/13.  He developed SBO during this hospitalization which resolved without surgical intervention.  He was discharged to inpatient rehab 09/25/2022.  Since then, he developed intermittent cough with possible aspiration pneumonia/pneumonitis placed on Zosyn.  COVID and respiratory panel were negative.  His hemoglobin dropped from 8.5 on 7/11 -> 7.0 on 7/14 -> 6.3 -> 6.1 yesterday and he was transfused one unit of PRBCs -> post transfusion  Hg today -> 7.0.  Hg FOBT positive.  A GI consult was requested for further evaluation regarding anemia with suspected GI bleed.  He denies having any history of GERD or stomach ulcers. He has infrequent heartburn. He developed dysphagia with solid foods during his hospitalization which has persisted while in rehab. He describes having food which gets stuck to the mid esophagus which occurs daily, food passes down after he drinks water. No nausea, regurgitation or vomiting. Swallow study done by speech pathology showed normal oral phase, noted delayed cough after liquids and solids, recommended modified barium swallow study. No upper or lower abdominal pain. He is  passing a solid BM most days, he does not look at his stool. He denies ever undergoing a colonoscopy but stated his PCP planned on scheduling him for one in the near future.   He has a history of lower GI bleed which required hospital admission 02/2019.  At that time, he presented with painless bright red hematochezia. CTAP obtained during that admission showed scattered diverticulosis with thickening suggesting possible diverticulitis in the region of the hepatic flexure, as well as some rectosigmoid mucosal thickening of uncertain clinical significance.  A diverticular bleed was suspected.  He was discharged home with recommendations to schedule a colonoscopy as an outpatient which was not done.  No known family history of esophageal, gastric or colorectal cancer.     GI PROCEDURES:  Past Medical History:  Diagnosis Date   Allergy    Anemia    Blood transfusion without reported diagnosis    Dialysis patient (HCC)    Tues,thurs,sat   ESRD (end stage renal disease) (HCC)    TTHS-    Family history of adverse reaction to anesthesia    father had allergic reaction with anesthesia with a dental procedure-(pt. doesn't know)   Hyperlipidemia    Hypertension     Past Surgical History:  Procedure Laterality Date   A/V FISTULAGRAM Left 12/15/2016   Procedure: A/V Fistulagram - left;  Surgeon: Chuck Hint, MD;  Location: [facility] INVASIVE CV LAB;  Service: Cardiovascular;  Laterality: Left;   AV FISTULA PLACEMENT Left 08/28/2016   Procedure: LEFT UPPER  ARM ARTERIOVENOUS (AV) FISTULA CREATION;  Surgeon: Chuck Hint, MD;  Location: [facility] OR;  Service: Vascular;  Laterality: Left;   DIALYSIS/PERMA CATHETER INSERTION Right 12/21/2017  Procedure: INSERTION OF DIALYSIS CATHETER Right Internal Jugular .;  Surgeon: Sherren Kerns, MD;  Location: [facility] OR;  Service: Vascular;  Laterality: Right;   FISTULA SUPERFICIALIZATION Left 09/23/2017   Procedure: FISTULA PLICATION LEFT ARM;   Surgeon: Sherren Kerns, MD;  Location: [facility] OR;  Service: Vascular;  Laterality: Left;   FISTULOGRAM Left 12/21/2017   Procedure: FISTULOGRAM with Balloon Angioplasty.;  Surgeon: Sherren Kerns, MD;  Location: [facility] OR;  Service: Vascular;  Laterality: Left;   HEMATOMA EVACUATION Left 08/27/2020   Procedure: EVACUATION HEMATOMA LEFT ARM;  Surgeon: Sherren Kerns, MD;  Location: [facility] OR;  Service: Vascular;  Laterality: Left;   IR FLUORO GUIDE CV LINE LEFT  09/22/2022   IR FLUORO GUIDE CV LINE RIGHT  09/12/2022   IR FLUORO GUIDE CV LINE RIGHT  09/17/2022   IR REMOVAL TUN CV CATH W/O FL  09/09/2022   IR THORACENTESIS ASP PLEURAL SPACE W/IMG GUIDE  08/22/2017   1.2 L -right-sided   IR THORACENTESIS ASP PLEURAL SPACE W/IMG GUIDE  10/19/2017   IR US GUIDE VASC ACCESS LEFT  09/22/2022   IR US GUIDE VASC ACCESS RIGHT  09/12/2022   KIDNEY TRANSPLANT     REVISON OF ARTERIOVENOUS FISTULA Left 12/21/2017   Procedure: PLICATION and Ligation of F LEFT ARM ARTERIOVENOUS FISTULA;  Surgeon: Sherren Kerns, MD;  Location: [facility] OR;  Service: Vascular;  Laterality: Left;   REVISON OF ARTERIOVENOUS FISTULA Left 07/16/2020   Procedure: EXCISION OF LEFT ARM ARTERIOVENOUS FISTULA;  Surgeon: Sherren Kerns, MD;  Location: [facility] OR;  Service: Vascular;  Laterality: Left;   stent in kidneys     dec 2017   TRANSTHORACIC ECHOCARDIOGRAM  09/22/2017    Severe LVH.  Normal function -EF 55-60%.  GRII DD.  Moderate RV dilation with mildly reduced RV function.   Fixed right coronary cusp with very mild aortic stenosis.  The myocardium has a speckled appearance --> .   Recommend cardiac MRI to evaluate for amyloid   UPPER EXTREMITY VENOGRAPHY Bilateral 06/01/2020   Procedure: UPPER EXTREMITY VENOGRAPHY;  Surgeon: Chuck Hint, MD;  Location: [facility] INVASIVE CV LAB;  Service: Cardiovascular;  Laterality: Bilateral;    Prior to Admission medications   Medication Sig Start Date End Date Taking? Authorizing Provider   atenolol (TENORMIN) 25 MG tablet Take 50 mg by mouth daily.    [provider]  atorvastatin (LIPITOR) 40 MG tablet Take 40 mg by mouth daily.    [provider]  calcitRIOL (ROCALTROL) 0.5 MCG capsule Take 1 capsule (0.5 mcg total) by mouth every Tuesday, Thursday, and Saturday at 6 PM. 09/25/22   Dorcas Carrow, MD  ceFAZolin (ANCEF) IVPB Inject 2 g into the vein Every Tuesday,Thursday,and Saturday with dialysis. Indication:  MSSA discitis/osteo First Dose: Yes Last Day of Therapy:  11/04/22 Labs - Once weekly:  CBC/D and BMP, ESR and CRP Method of administration: Per HD protocol Method of administration may be changed at the discretion of home infusion pharmacist based upon assessment of the patient and/or caregiver's ability to self-administer the medication ordered. 09/16/22 11/04/22  Danelle Earthly, MD  chlorproMAZINE (THORAZINE) 25 MG tablet Take 1 tablet (25 mg total) by mouth 3 (three) times daily as needed for hiccoughs. 09/25/22   Dorcas Carrow, MD  cinacalcet (SENSIPAR) 30 MG tablet Take 30 mg by mouth at bedtime. Patient not taking: Reported on 09/08/2022 12/08/17   [provider]  EPINEPHrine 0.3 mg/0.3 mL IJ SOAJ injection Inject 0.3 mg into  the muscle as needed for anaphylaxis. 06/08/21   Redwine, Madison A, PA-C  loratadine (CLARITIN) 10 MG tablet Take 10 mg by mouth daily.    [provider]  multivitamin (RENA-VIT) TABS tablet Take 1 tablet by mouth daily.    [provider]  oxyCODONE (OXY IR/ROXICODONE) 5 MG immediate release tablet Take 1 tablet (5 mg total) by mouth every 4 (four) hours as needed for moderate pain or severe pain. 09/25/22   Dorcas Carrow, MD  pantoprazole (PROTONIX) 40 MG tablet Take 1 tablet (40 mg total) by mouth daily. 09/25/22   Dorcas Carrow, MD  sucroferric oxyhydroxide (VELPHORO) 500 MG chewable tablet Chew 2 tablets (1,000 mg total) by mouth 3 (three) times daily with meals. 09/25/22   Dorcas Carrow, MD   tetrahydrozoline-zinc (VISINE-AC) 0.05-0.25 % ophthalmic solution Place 2 drops into both eyes 3 (three) times daily as needed (dry eyes).    [provider]  triamcinolone cream (KENALOG) 0.1 % Apply 1 application topically daily as needed (dry spots on face/excema).    [provider]    Current Facility-Administered Medications  Medication Dose Route Frequency Provider Last Rate Last Admin   (feeding supplement) PROSource Plus liquid 30 mL  30 mL Oral TID BM Lovorn, Megan, MD   30 mL at 10/08/22 0803   0.9 %  sodium chloride infusion (Manually program via Guardrails IV Fluids)   Intravenous Once Angiulli, Mcarthur Rossetti, PA-C       acetaminophen (TYLENOL) tablet 325-650 mg  325-650 mg Oral Q4H PRN Jacquelynn Cree, PA-C   650 mg at 10/01/22 2150   albuterol (PROVENTIL) (2.5 MG/3ML) 0.083% nebulizer solution 2.5 mg  2.5 mg Nebulization Q6H PRN Lovorn, Megan, MD       albuterol (PROVENTIL) (2.5 MG/3ML) 0.083% nebulizer solution 2.5 mg  2.5 mg Nebulization TID Lovorn, Megan, MD   2.5 mg at 10/08/22 0730   benzonatate (TESSALON) capsule 100 mg  100 mg Oral BID Lovorn, Aundra Millet, MD   100 mg at 10/08/22 0805   bisacodyl (DULCOLAX) suppository 10 mg  10 mg Rectal Daily PRN Love, Pamela S, PA-C       calcitRIOL (ROCALTROL) capsule 0.5 mcg  0.5 mcg Oral Q T,Th,Sat-1800 Love, Pamela S, PA-C   0.5 mcg at 10/07/22 1959   calcium carbonate (TUMS - dosed in mg elemental calcium) chewable tablet 200 mg of elemental calcium  1 tablet Oral QID PRN Love, Pamela S, PA-C   200 mg of elemental calcium at 10/04/22 1715   camphor-menthol (SARNA) lotion   Topical PRN Love, Pamela S, PA-C       Chlorhexidine Gluconate Cloth 2 % PADS 6 each  6 each Topical Q12H Lovorn, Aundra Millet, MD   6 each at 10/08/22 0500   chlorproMAZINE (THORAZINE) tablet 25 mg  25 mg Oral TID PRN Jacquelynn Cree, PA-C       cinacalcet (SENSIPAR) tablet 30 mg  30 mg Oral Q supper Love, Pamela S, PA-C   30 mg at 10/07/22 2001   [START ON  10/09/2022] Darbepoetin Alfa (ARANESP) injection 150 mcg  150 mcg Subcutaneous Q Thu-1800 Penninger, Lillia Abed, [place]       diphenhydrAMINE (BENADRYL) capsule 25 mg  25 mg Oral Q6H PRN Love, Pamela S, PA-C       feeding supplement (NEPRO CARB STEADY) liquid 237 mL  237 mL Oral TID BM Lovorn, Megan, MD   237 mL at 10/08/22 0805   fluticasone (FLONASE) 50 MCG/ACT nasal spray 1 spray  1  spray Each Nare Daily Noralee Stain, DO   1 spray at 10/08/22 0805   guaiFENesin (MUCINEX) 12 hr tablet 600 mg  600 mg Oral BID Madelyn Flavors A, MD   600 mg at 10/08/22 0804   guaiFENesin-codeine 100-10 MG/5ML solution 5 mL  5 mL Oral Q4H PRN Lovorn, Megan, MD   5 mL at 10/07/22 2226   guaiFENesin-dextromethorphan (ROBITUSSIN DM) 100-10 MG/5ML syrup 5-10 mL  5-10 mL Oral Q6H PRN Jacquelynn Cree, PA-C   10 mL at 10/07/22 1958   lip balm (BLISTEX) ointment   Topical PRN Love, Evlyn Kanner, PA-C       loratadine (CLARITIN) tablet 10 mg  10 mg Oral Daily Noralee Stain, DO   10 mg at 10/08/22 0805   melatonin tablet 3 mg  3 mg Oral QHS PRN Jacquelynn Cree, PA-C   3 mg at 10/08/22 0111   menthol-cetylpyridinium (CEPACOL) lozenge 3 mg  1 lozenge Oral PRN Street, [place], [place]   3 mg at 10/01/22 2152   methocarbamol (ROBAXIN) tablet 250 mg  250 mg Oral BID Jacquelynn Cree, PA-C   250 mg at 10/08/22 0615   midodrine (PROAMATINE) tablet 10 mg  10 mg Oral TID WC Pola Corn, NP   10 mg at 10/08/22 0805   milk and molasses enema  1 enema Rectal Daily PRN Love, Pamela S, PA-C       multivitamin (RENA-VIT) tablet 1 tablet  1 tablet Oral QHS Love, Pamela S, PA-C   1 tablet at 10/07/22 2227   oxyCODONE (Oxy IR/ROXICODONE) immediate release tablet 7.5-10 mg  7.5-10 mg Oral Q4H PRN Lovorn, Aundra Millet, MD   10 mg at 10/07/22 2227   pantoprazole (PROTONIX) EC tablet 40 mg  40 mg Oral Daily Jacquelynn Cree, PA-C   40 mg at 10/08/22 0805   phenol (CHLORASEPTIC) mouth spray 1 spray  1 spray Mouth/Throat PRN Jacquelynn Cree, PA-C   1 spray at  10/05/22 2158   piperacillin-tazobactam (ZOSYN) IVPB 2.25 g  2.25 g Intravenous Q8H Klus, Jesse, Student-PharmD 100 mL/hr at 10/08/22 0622 2.25 g at 10/08/22 0622   polyethylene glycol (MIRALAX / GLYCOLAX) packet 17 g  17 g Oral Daily PRN Street, [place], PA-C       senna (SENOKOT) tablet 8.6 mg  1 tablet Oral Daily PRN Jacquelynn Cree, PA-C   8.6 mg at 09/27/22 2141   simethicone (MYLICON) chewable tablet 80 mg  80 mg Oral QID PRN Jacquelynn Cree, PA-C       sucroferric oxyhydroxide (VELPHORO) chewable tablet 1,000 mg  1,000 mg Oral TID WC Love, Pamela S, PA-C   1,000 mg at 10/08/22 0805   traZODone (DESYREL) tablet 150 mg  150 mg Oral QHS Lovorn, Megan, MD   150 mg at 10/07/22 1959    Allergies as of 09/25/2022 - Review Complete 09/25/2022  Allergen Reaction Noted   Iodinated contrast media Itching and Nausea Only 05/16/2022   Z-pak [azithromycin] Nausea And Vomiting and Other (See Comments) 08/30/2017    Family History  Problem Relation Age of Onset   Hypertension Mother    Heart disease Mother 59       By his report, he thinks that she had heart attack.   Stroke Mother    Cancer Father    Kidney cancer Father    Hypertension Sister    Heart disease Maternal Grandmother    Alcohol abuse Maternal Grandfather    Mental illness Paternal Grandmother  Learning disabilities Paternal Grandmother        Alzheimer's    Stroke Paternal Grandfather    Colon cancer Neg Hx    Colon polyps Neg Hx    Esophageal cancer Neg Hx    Rectal cancer Neg Hx    Stomach cancer Neg Hx     Social History   Socioeconomic History   Marital status: Divorced    Spouse name: Not on file   Number of children: Not on file   Years of education: Not on file   Highest education level: Not on file  Occupational History   Not on file  Tobacco Use   Smoking status: Never   Smokeless tobacco: Never  Vaping Use   Vaping status: Never Used  Substance and Sexual Activity   Alcohol use: No   Drug use: No    Sexual activity: Not Currently    Birth control/protection: None  Other Topics Concern   Not on file  Social History Narrative   Divorced   Does Mudlogger work   3 years of college.   Does not drink, does not smoke.  Does not use illicit drugs.   Son in nursing school at [employer]   Social Determinants of Health   Financial Resource Strain: Medium Risk (05/16/2022)   Received from [facility], [facility]   Overall Financial Resource Strain (CARDIA)    Difficulty of Paying Living Expenses: Somewhat hard  Food Insecurity: No Food Insecurity (09/11/2022)   Hunger Vital Sign    Worried About Running Out of Food in the Last Year: Never true    Ran Out of Food in the Last Year: Never true  Transportation Needs: No Transportation Needs (09/11/2022)   PRAPARE - Administrator, Civil Service (Medical): No    Lack of Transportation (Non-Medical): No  Physical Activity: Not on file  Stress: Not on file  Social Connections: Not on file  Intimate Partner Violence: Not At Risk (09/11/2022)   Humiliation, Afraid, Rape, and Kick questionnaire    Fear of Current or Ex-Partner: No    Emotionally Abused: No    Physically Abused: No    Sexually Abused: No    Review of Systems: Gen: Denies fever, sweats or chills. No weight loss.  CV: Denies chest pain, palpitations or edema. Resp: Denies cough, shortness of breath of hemoptysis.  GI: See HPI.   GU: Anuric. On HD.  MS: + Back pain. Derm: Denies rash, itchiness, skin lesions or unhealing ulcers. Psych: Denies depression, anxiety, memory loss or confusion. Heme: Denies easy bruising, bleeding. Neuro:  Denies headaches, dizziness or paresthesias. Endo:  Denies any problems with DM, thyroid or adrenal function.  Physical Exam: Vital signs in last 24 hours: Temp:  [97.8 F (36.6 C)-98.9 F (37.2 C)] 98.5 F (36.9 C) (07/17 0433) Pulse Rate:  [94-126] 99 (07/17 0433) Resp:  [18-38]  23 (07/17 0433) BP: (92-122)/(55-71) 111/69 (07/17 0433) SpO2:  [92 %-100 %] 96 % (07/17 0433) FiO2 (%):  [28 %] 28 % (07/16 0817) Weight:  [80.8 kg-83.8 kg] 82.9 kg (07/17 0600) Last BM Date : 10/05/22 General: Alert 62 year old male in NAD. Head:  Normocephalic and atraumatic. Eyes:  No scleral icterus. Conjunctiva pink. Ears:  Normal auditory acuity. Nose:  No deformity, discharge or lesions. Mouth:  Dentition intact. No ulcers or lesions. No thrush. Neck:  Supple. No lymphadenopathy or thyromegaly.  Lungs: Breath sounds clear throughout. No wheezes, rhonchi or  crackles.  Heart:  RRR, soft murmur, ? rub.  Abdomen:  Soft, nondistended. Nontender. Positive bowel sounds x 4 quads. No bruit. No palpable mass.  Rectal: Deferred. Musculoskeletal:  Symmetrical without gross deformities.  Pulses:  Normal pulses noted. Extremities:  Without clubbing or edema. Neurologic:  Alert and  oriented x 4. No focal deficits.  Skin:  Intact without significant lesions or rashes. Psych:  Alert and cooperative. Normal mood and affect.  Intake/Output from previous day: 07/16 0701 - 07/17 0700 In: 805 [P.O.:120; I.V.:50; Blood:435; IV Piggyback:200] Out: 2500  Intake/Output this shift: No intake/output data recorded.  Lab Results: Recent Labs    10/06/22 0637 10/07/22 0548 10/08/22 0605  WBC 13.8* 12.9* 13.7*  HGB 6.3* 6.1* 7.0*  HCT 21.3* 20.0* 22.6*  PLT 321 358 349   BMET Recent Labs    10/05/22 2023 10/06/22 0637 10/08/22 0605  NA 129* 129* 127*  K 3.8 3.8 3.7  CL 89* 92* 92*  CO2 25 24 25   GLUCOSE 121* 100* 108*  BUN 36* 41* 30*  CREATININE 6.93* 7.89* 6.04*  CALCIUM 8.5* 8.2* 7.8*   LFT Recent Labs    10/06/22 0637  ALBUMIN 1.6*   PT/INR No results for input(s): "LABPROT", "INR" in the last 72 hours. Hepatitis Panel No results for input(s): "HEPBSAG", "HCVAB", "HEPAIGM", "HEPBIGM" in the last 72 hours.    Studies/Results: No results  found.  IMPRESSION/PLAN:  62 year old male admitted to the hospital 08/28/2022 with lower back pain subsequently diagnosed with L5/S1 discitis and osteomyelitis as well as MSSA bacteremia with mitral valve endocarditis. Chest CT identified septic emboli with new airspace opacities in the left upper lobe . MRI brain showed scattered subcentimeter infarcts involving the bilateral cerebral hemisphere and right cerebellum. His clinical status stabilized on he was transferred to inpatient rehab 09/25/2022.  Acute on chronic anemia. Hg dropped from 8.5 on 7/11 -> 7.0 on 7/14 -> 6.3 -> 6.1 yesterday and he was transfused one unit of PRBCs -> post transfusion  Hg today -> 7.0.  Hg FOBT positive.  -EGD and colonoscopy deferred for now, to reconsider endoscopic evaluation if Hg level continues to drop or if patient actively demonstrates active GI bleeding -See further GI recommendations below -Transfuse for Hg < 7 -CBC in am -PPI  -Renal diet  -Await further recommendations per Dr. Lavon Paganini  ESRD, s/p remote kidney transplant. On HD.   Dysphagia, esophageal.  -Pantoprazole 40mg  po QD -Dysphagia 3 diet and modified barium swallow study per speech pathologist   Acute respiratory failure, questionable pneumonia. On Zosyn. ON oxygen 2L [place].   SBO, resolved without surgical intervention      Arnaldo Natal  10/08/2022, 11:16 AM   Attending physician's note  I have taken a history, reviewed the chart and examined the patient. I performed a substantive portion of this encounter, including complete performance of at least one of the key components, in conjunction with the APP. I agree with the APP's note, impression and recommendations.    62 year old male with history of end-stage renal disease s/p kidney transplant in 2010 currently on hemodialysis, L5-S1 discitis, osteomyelitis with MSSA bacteremia and mitral valve endocarditis Hospitalization complicated by septic emboli, and SBO, which  spontaneously resolved without surgical intervention  He has decline in hemoglobin, no sign of overt rectal bleeding or melena We will plan for conservative management and defer endoscopic evaluation at this point He is high risk for procedure and sedation related complications Use PPI twice daily Monitor hemoglobin  and transfuse as needed  Follow modified barium swallow study to exclude oropharyngeal dysphagia  GI will continue to follow along   The patient was provided an opportunity to ask questions and all were answered. The patient agreed with the plan and demonstrated an understanding of the instructions.  Iona Beard , MD [phone]

## 2022-10-08 NOTE — Progress Notes (Signed)
Speech Language Pathology Daily Session Note  Patient Details  Name: Ryan [name] MRN: [id number] Date of Birth: [birth year]  Today's Date: 10/08/2022 SLP Individual Time: 1430-1530 SLP Individual Time Calculation (min): 60 min  Short Term Goals: Week 2: SLP Short Term Goal 1 (Week 2): STGs = LTGs 2* ELOS  Skilled Therapeutic Interventions: Skilled therapy session focused on cognitive re-training. SLP facilitated session by providing mod I assist during functional problem solving tasks. Pt utilized a phone bill to determine how much each household bill costs with mod I assist. He also utilized a grocery price sheet to answer functional mathematical calculation questions with supervision A 2x, otherwise independently. SLP explained MBS procedure set for tomorrow, patient verbalized understanding. Patient left in chair with alarm set and call bell in reach. Continue POC.   Pain Denies Pain.  Therapy/Group: Individual Therapy  Jagger Demonte M.A., CF-SLP 10/08/2022, 3:42 PM

## 2022-10-09 ENCOUNTER — Inpatient Hospital Stay (HOSPITAL_COMMUNITY): Admission: RE | Admit: 2022-10-09 | Payer: No Typology Code available for payment source | Source: Intra-hospital

## 2022-10-09 ENCOUNTER — Inpatient Hospital Stay (HOSPITAL_COMMUNITY): Payer: No Typology Code available for payment source

## 2022-10-09 ENCOUNTER — Inpatient Hospital Stay (HOSPITAL_COMMUNITY)
Admission: AD | Admit: 2022-10-09 | Discharge: 2022-11-07 | DRG: 308 | Disposition: A | Discharge status: Home Health | Payer: No Typology Code available for payment source | Source: Other Acute Inpatient Hospital | Attending: Internal Medicine | Admitting: Internal Medicine

## 2022-10-09 ENCOUNTER — Encounter (HOSPITAL_COMMUNITY): Payer: Self-pay

## 2022-10-09 ENCOUNTER — Encounter (HOSPITAL_COMMUNITY): Payer: Self-pay | Admitting: Internal Medicine

## 2022-10-09 ENCOUNTER — Other Ambulatory Visit: Payer: Self-pay

## 2022-10-09 DIAGNOSIS — I5031 Acute diastolic (congestive) heart failure: Secondary | ICD-10-CM

## 2022-10-09 DIAGNOSIS — I425 Other restrictive cardiomyopathy: Secondary | ICD-10-CM | POA: Diagnosis present

## 2022-10-09 DIAGNOSIS — Z8249 Family history of ischemic heart disease and other diseases of the circulatory system: Secondary | ICD-10-CM

## 2022-10-09 DIAGNOSIS — M4627 Osteomyelitis of vertebra, lumbosacral region: Secondary | ICD-10-CM | POA: Diagnosis not present

## 2022-10-09 DIAGNOSIS — T8612 Kidney transplant failure: Secondary | ICD-10-CM | POA: Diagnosis not present

## 2022-10-09 DIAGNOSIS — I4891 Unspecified atrial fibrillation: Principal | ICD-10-CM | POA: Diagnosis present

## 2022-10-09 DIAGNOSIS — J9601 Acute respiratory failure with hypoxia: Secondary | ICD-10-CM | POA: Diagnosis not present

## 2022-10-09 DIAGNOSIS — I39 Endocarditis and heart valve disorders in diseases classified elsewhere: Secondary | ICD-10-CM | POA: Diagnosis not present

## 2022-10-09 DIAGNOSIS — I953 Hypotension of hemodialysis: Secondary | ICD-10-CM | POA: Diagnosis present

## 2022-10-09 DIAGNOSIS — J811 Chronic pulmonary edema: Secondary | ICD-10-CM | POA: Diagnosis not present

## 2022-10-09 DIAGNOSIS — Z452 Encounter for adjustment and management of vascular access device: Secondary | ICD-10-CM | POA: Diagnosis not present

## 2022-10-09 DIAGNOSIS — Z8673 Personal history of transient ischemic attack (TIA), and cerebral infarction without residual deficits: Secondary | ICD-10-CM

## 2022-10-09 DIAGNOSIS — R7881 Bacteremia: Secondary | ICD-10-CM | POA: Diagnosis not present

## 2022-10-09 DIAGNOSIS — Z79899 Other long term (current) drug therapy: Secondary | ICD-10-CM | POA: Diagnosis not present

## 2022-10-09 DIAGNOSIS — E871 Hypo-osmolality and hyponatremia: Secondary | ICD-10-CM | POA: Diagnosis not present

## 2022-10-09 DIAGNOSIS — Z820 Family history of epilepsy and other diseases of the nervous system: Secondary | ICD-10-CM

## 2022-10-09 DIAGNOSIS — R918 Other nonspecific abnormal finding of lung field: Secondary | ICD-10-CM | POA: Diagnosis not present

## 2022-10-09 DIAGNOSIS — E859 Amyloidosis, unspecified: Secondary | ICD-10-CM | POA: Diagnosis present

## 2022-10-09 DIAGNOSIS — I4719 Other supraventricular tachycardia: Principal | ICD-10-CM | POA: Diagnosis present

## 2022-10-09 DIAGNOSIS — N250 Renal osteodystrophy: Secondary | ICD-10-CM | POA: Diagnosis not present

## 2022-10-09 DIAGNOSIS — E43 Unspecified severe protein-calorie malnutrition: Secondary | ICD-10-CM | POA: Diagnosis not present

## 2022-10-09 DIAGNOSIS — I440 Atrioventricular block, first degree: Secondary | ICD-10-CM | POA: Diagnosis present

## 2022-10-09 DIAGNOSIS — Z1152 Encounter for screening for COVID-19: Secondary | ICD-10-CM | POA: Diagnosis not present

## 2022-10-09 DIAGNOSIS — I5033 Acute on chronic diastolic (congestive) heart failure: Secondary | ICD-10-CM | POA: Diagnosis present

## 2022-10-09 DIAGNOSIS — D631 Anemia in chronic kidney disease: Secondary | ICD-10-CM | POA: Diagnosis present

## 2022-10-09 DIAGNOSIS — E8770 Fluid overload, unspecified: Secondary | ICD-10-CM | POA: Diagnosis not present

## 2022-10-09 DIAGNOSIS — R451 Restlessness and agitation: Secondary | ICD-10-CM | POA: Diagnosis not present

## 2022-10-09 DIAGNOSIS — E440 Moderate protein-calorie malnutrition: Secondary | ICD-10-CM | POA: Diagnosis present

## 2022-10-09 DIAGNOSIS — I330 Acute and subacute infective endocarditis: Secondary | ICD-10-CM | POA: Diagnosis present

## 2022-10-09 DIAGNOSIS — M4626 Osteomyelitis of vertebra, lumbar region: Secondary | ICD-10-CM | POA: Diagnosis present

## 2022-10-09 DIAGNOSIS — Z91041 Radiographic dye allergy status: Secondary | ICD-10-CM

## 2022-10-09 DIAGNOSIS — J188 Other pneumonia, unspecified organism: Secondary | ICD-10-CM | POA: Diagnosis not present

## 2022-10-09 DIAGNOSIS — I2690 Septic pulmonary embolism without acute cor pulmonale: Secondary | ICD-10-CM | POA: Diagnosis present

## 2022-10-09 DIAGNOSIS — R0902 Hypoxemia: Secondary | ICD-10-CM | POA: Diagnosis not present

## 2022-10-09 DIAGNOSIS — Z823 Family history of stroke: Secondary | ICD-10-CM

## 2022-10-09 DIAGNOSIS — R0602 Shortness of breath: Secondary | ICD-10-CM | POA: Diagnosis not present

## 2022-10-09 DIAGNOSIS — D62 Acute posthemorrhagic anemia: Secondary | ICD-10-CM | POA: Diagnosis not present

## 2022-10-09 DIAGNOSIS — Z86711 Personal history of pulmonary embolism: Secondary | ICD-10-CM

## 2022-10-09 DIAGNOSIS — J189 Pneumonia, unspecified organism: Secondary | ICD-10-CM | POA: Diagnosis not present

## 2022-10-09 DIAGNOSIS — I132 Hypertensive heart and chronic kidney disease with heart failure and with stage 5 chronic kidney disease, or end stage renal disease: Secondary | ICD-10-CM | POA: Diagnosis not present

## 2022-10-09 DIAGNOSIS — I9589 Other hypotension: Secondary | ICD-10-CM | POA: Diagnosis present

## 2022-10-09 DIAGNOSIS — N186 End stage renal disease: Secondary | ICD-10-CM

## 2022-10-09 DIAGNOSIS — D638 Anemia in other chronic diseases classified elsewhere: Secondary | ICD-10-CM | POA: Diagnosis present

## 2022-10-09 DIAGNOSIS — Z881 Allergy status to other antibiotic agents status: Secondary | ICD-10-CM

## 2022-10-09 DIAGNOSIS — I77810 Thoracic aortic ectasia: Secondary | ICD-10-CM | POA: Diagnosis not present

## 2022-10-09 DIAGNOSIS — R195 Other fecal abnormalities: Secondary | ICD-10-CM | POA: Diagnosis present

## 2022-10-09 DIAGNOSIS — T80211D Bloodstream infection due to central venous catheter, subsequent encounter: Secondary | ICD-10-CM | POA: Diagnosis not present

## 2022-10-09 DIAGNOSIS — I76 Septic arterial embolism: Secondary | ICD-10-CM | POA: Diagnosis not present

## 2022-10-09 DIAGNOSIS — E785 Hyperlipidemia, unspecified: Secondary | ICD-10-CM | POA: Diagnosis not present

## 2022-10-09 DIAGNOSIS — F419 Anxiety disorder, unspecified: Secondary | ICD-10-CM | POA: Diagnosis present

## 2022-10-09 DIAGNOSIS — E854 Organ-limited amyloidosis: Secondary | ICD-10-CM | POA: Diagnosis present

## 2022-10-09 DIAGNOSIS — M4647 Discitis, unspecified, lumbosacral region: Secondary | ICD-10-CM | POA: Diagnosis present

## 2022-10-09 DIAGNOSIS — M869 Osteomyelitis, unspecified: Secondary | ICD-10-CM

## 2022-10-09 DIAGNOSIS — R0603 Acute respiratory distress: Secondary | ICD-10-CM | POA: Diagnosis not present

## 2022-10-09 DIAGNOSIS — I10 Essential (primary) hypertension: Secondary | ICD-10-CM | POA: Diagnosis not present

## 2022-10-09 DIAGNOSIS — R059 Cough, unspecified: Secondary | ICD-10-CM | POA: Diagnosis present

## 2022-10-09 DIAGNOSIS — Z6822 Body mass index (BMI) 22.0-22.9, adult: Secondary | ICD-10-CM

## 2022-10-09 DIAGNOSIS — I120 Hypertensive chronic kidney disease with stage 5 chronic kidney disease or end stage renal disease: Secondary | ICD-10-CM | POA: Diagnosis not present

## 2022-10-09 DIAGNOSIS — R0989 Other specified symptoms and signs involving the circulatory and respiratory systems: Secondary | ICD-10-CM | POA: Diagnosis not present

## 2022-10-09 DIAGNOSIS — I959 Hypotension, unspecified: Secondary | ICD-10-CM

## 2022-10-09 DIAGNOSIS — B9561 Methicillin susceptible Staphylococcus aureus infection as the cause of diseases classified elsewhere: Secondary | ICD-10-CM | POA: Diagnosis not present

## 2022-10-09 DIAGNOSIS — Y830 Surgical operation with transplant of whole organ as the cause of abnormal reaction of the patient, or of later complication, without mention of misadventure at the time of the procedure: Secondary | ICD-10-CM | POA: Diagnosis present

## 2022-10-09 DIAGNOSIS — R000 Tachycardia, unspecified: Secondary | ICD-10-CM | POA: Diagnosis not present

## 2022-10-09 DIAGNOSIS — I43 Cardiomyopathy in diseases classified elsewhere: Secondary | ICD-10-CM | POA: Diagnosis not present

## 2022-10-09 DIAGNOSIS — N289 Disorder of kidney and ureter, unspecified: Secondary | ICD-10-CM | POA: Diagnosis not present

## 2022-10-09 DIAGNOSIS — Z992 Dependence on renal dialysis: Secondary | ICD-10-CM | POA: Diagnosis not present

## 2022-10-09 DIAGNOSIS — I517 Cardiomegaly: Secondary | ICD-10-CM | POA: Diagnosis not present

## 2022-10-09 DIAGNOSIS — R140 Abdominal distension (gaseous): Secondary | ICD-10-CM | POA: Diagnosis not present

## 2022-10-09 DIAGNOSIS — I339 Acute and subacute endocarditis, unspecified: Secondary | ICD-10-CM | POA: Diagnosis not present

## 2022-10-09 DIAGNOSIS — R5381 Other malaise: Secondary | ICD-10-CM | POA: Diagnosis not present

## 2022-10-09 DIAGNOSIS — E861 Hypovolemia: Secondary | ICD-10-CM | POA: Diagnosis not present

## 2022-10-09 DIAGNOSIS — R1310 Dysphagia, unspecified: Secondary | ICD-10-CM | POA: Diagnosis present

## 2022-10-09 LAB — BPAM RBC
Blood Product Expiration Date: 202408132359
Blood Product Expiration Date: 202408132359
ISSUE DATE / TIME: 202407161454
ISSUE DATE / TIME: 202407172127
Unit Type and Rh: 5100
Unit Type and Rh: 5100

## 2022-10-09 LAB — CBC
HCT: 24.8 % — ABNORMAL LOW (ref 39.0–52.0)
HCT: 26.4 % — ABNORMAL LOW (ref 39.0–52.0)
Hemoglobin: 7.7 g/dL — ABNORMAL LOW (ref 13.0–17.0)
Hemoglobin: 8.3 g/dL — ABNORMAL LOW (ref 13.0–17.0)
MCH: 25.4 pg — ABNORMAL LOW (ref 26.0–34.0)
MCH: 26 pg (ref 26.0–34.0)
MCHC: 31 g/dL (ref 30.0–36.0)
MCHC: 31.4 g/dL (ref 30.0–36.0)
MCV: 81.8 fL (ref 80.0–100.0)
MCV: 82.8 fL (ref 80.0–100.0)
Platelets: 377 10*3/uL (ref 150–400)
Platelets: 407 10*3/uL — ABNORMAL HIGH (ref 150–400)
RBC: 3.03 MIL/uL — ABNORMAL LOW (ref 4.22–5.81)
RBC: 3.19 MIL/uL — ABNORMAL LOW (ref 4.22–5.81)
RDW: 16.3 % — ABNORMAL HIGH (ref 11.5–15.5)
RDW: 16.3 % — ABNORMAL HIGH (ref 11.5–15.5)
WBC: 14.3 10*3/uL — ABNORMAL HIGH (ref 4.0–10.5)
WBC: 13.9 10*3/uL — ABNORMAL HIGH (ref 4.0–10.5)
nRBC: 1 % — ABNORMAL HIGH (ref 0.0–0.2)
nRBC: 1.6 % — ABNORMAL HIGH (ref 0.0–0.2)

## 2022-10-09 LAB — RENAL FUNCTION PANEL
Albumin: 1.9 g/dL — ABNORMAL LOW (ref 3.5–5.0)
Albumin: 2 g/dL — ABNORMAL LOW (ref 3.5–5.0)
Anion gap: 12 (ref 5–15)
Anion gap: 15 (ref 5–15)
BUN: 40 mg/dL — ABNORMAL HIGH (ref 8–23)
BUN: 25 mg/dL — ABNORMAL HIGH (ref 8–23)
CO2: 24 mmol/L (ref 22–32)
CO2: 24 mmol/L (ref 22–32)
Calcium: 7.9 mg/dL — ABNORMAL LOW (ref 8.9–10.3)
Calcium: 8.2 mg/dL — ABNORMAL LOW (ref 8.9–10.3)
Chloride: 88 mmol/L — ABNORMAL LOW (ref 98–111)
Chloride: 89 mmol/L — ABNORMAL LOW (ref 98–111)
Creatinine, Ser: 7.42 mg/dL — ABNORMAL HIGH (ref 0.61–1.24)
Creatinine, Ser: 5.13 mg/dL — ABNORMAL HIGH (ref 0.61–1.24)
GFR, Estimated: 8 mL/min — ABNORMAL LOW (ref >60)
GFR, Estimated: 12 mL/min — ABNORMAL LOW (ref >60)
Glucose, Bld: 115 mg/dL — ABNORMAL HIGH (ref 70–99)
Glucose, Bld: 122 mg/dL — ABNORMAL HIGH (ref 70–99)
Phosphorus: 3.8 mg/dL (ref 2.5–4.6)
Phosphorus: 3.2 mg/dL (ref 2.5–4.6)
Potassium: 3.9 mmol/L (ref 3.5–5.1)
Potassium: 3.8 mmol/L (ref 3.5–5.1)
Sodium: 124 mmol/L — ABNORMAL LOW (ref 135–145)
Sodium: 128 mmol/L — ABNORMAL LOW (ref 135–145)

## 2022-10-09 LAB — TYPE AND SCREEN
ABO/RH(D): O POS
Antibody Screen: POSITIVE
DAT, IgG: POSITIVE
Unit division: 0
Unit division: 0

## 2022-10-09 LAB — CREATININE, SERUM
Creatinine, Ser: 5.27 mg/dL — ABNORMAL HIGH (ref 0.61–1.24)
GFR, Estimated: 12 mL/min — ABNORMAL LOW (ref >60)

## 2022-10-09 LAB — HEPATITIS B SURFACE ANTIGEN: Hepatitis B Surface Ag: NONREACTIVE

## 2022-10-09 LAB — TSH: TSH: 8.976 u[IU]/mL — ABNORMAL HIGH (ref 0.350–4.500)

## 2022-10-09 MED ORDER — ALTEPLASE 2 MG IJ SOLR: 2.0000 mg | Freq: Once | INTRAMUSCULAR | Status: DC | PRN

## 2022-10-09 MED ORDER — ONDANSETRON HCL 4 MG PO TABS: 4.0000 mg | ORAL_TABLET | Freq: Four times a day (QID) | ORAL | Status: DC | PRN

## 2022-10-09 MED ORDER — REVEFENACIN 175 MCG/3ML IN SOLN: 175.0000 ug | Freq: Every day | RESPIRATORY_TRACT | Status: AC

## 2022-10-09 MED ORDER — PANTOPRAZOLE SODIUM 40 MG PO TBEC
40.0000 mg | DELAYED_RELEASE_TABLET | Freq: Every day | ORAL | Status: DC
  Administered 2022-10-09 – 2022-11-07 (×29): 40 mg via ORAL
  Filled 2022-10-09 (×29): qty 1

## 2022-10-09 MED ORDER — HEPARIN SODIUM (PORCINE) 1000 UNIT/ML DIALYSIS: 1000.0000 [IU] | INTRAMUSCULAR | Status: DC | PRN

## 2022-10-09 MED ORDER — PENTAFLUOROPROP-TETRAFLUOROETH EX AERO: 1.0000 | INHALATION_SPRAY | CUTANEOUS | Status: DC | PRN

## 2022-10-09 MED ORDER — ONDANSETRON HCL 4 MG/2ML IJ SOLN
4.0000 mg | Freq: Four times a day (QID) | INTRAMUSCULAR | Status: DC | PRN
  Administered 2022-10-31 – 2022-11-05 (×2): 4 mg via INTRAVENOUS
  Filled 2022-10-09 (×2): qty 2

## 2022-10-09 MED ORDER — BENZONATATE 100 MG PO CAPS: 100.0000 mg | ORAL_CAPSULE | Freq: Two times a day (BID) | ORAL | Status: DC

## 2022-10-09 MED ORDER — CEFAZOLIN SODIUM-DEXTROSE 2-4 GM/100ML-% IV SOLN
2.0000 g | INTRAVENOUS | Status: DC
  Administered 2022-10-09 – 2022-10-11 (×3): 2 g via INTRAVENOUS
  Filled 2022-10-09 (×4): qty 100

## 2022-10-09 MED ORDER — MIDODRINE HCL 10 MG PO TABS: 10.0000 mg | ORAL_TABLET | Freq: Three times a day (TID) | ORAL | Status: DC

## 2022-10-09 MED ORDER — CALCIUM CARBONATE ANTACID 500 MG PO CHEW: 1.0000 | CHEWABLE_TABLET | Freq: Four times a day (QID) | ORAL | Status: AC | PRN

## 2022-10-09 MED ORDER — METHOCARBAMOL 500 MG PO TABS: 250.0000 mg | ORAL_TABLET | Freq: Two times a day (BID) | ORAL | Status: DC

## 2022-10-09 MED ORDER — SENNOSIDES-DOCUSATE SODIUM 8.6-50 MG PO TABS
1.0000 | ORAL_TABLET | Freq: Every evening | ORAL | Status: DC | PRN
  Filled 2022-10-09: qty 1

## 2022-10-09 MED ORDER — CHLORHEXIDINE GLUCONATE CLOTH 2 % EX PADS
6.0000 | MEDICATED_PAD | Freq: Every day | CUTANEOUS | Status: DC
  Administered 2022-10-10 – 2022-11-07 (×30): 6 via TOPICAL

## 2022-10-09 MED ORDER — METHOCARBAMOL 500 MG PO TABS
250.0000 mg | ORAL_TABLET | Freq: Two times a day (BID) | ORAL | Status: DC
  Administered 2022-10-09 – 2022-10-16 (×15): 250 mg via ORAL
  Filled 2022-10-09 (×15): qty 1

## 2022-10-09 MED ORDER — HEPARIN SODIUM (PORCINE) 1000 UNIT/ML DIALYSIS: 3000.0000 [IU] | Freq: Once | INTRAMUSCULAR | Status: DC

## 2022-10-09 MED ORDER — ANTICOAGULANT SODIUM CITRATE 4% (200MG/5ML) IV SOLN: 5.0000 mL | Status: DC | PRN

## 2022-10-09 MED ORDER — GUAIFENESIN ER 600 MG PO TB12: 600.0000 mg | ORAL_TABLET | Freq: Two times a day (BID) | ORAL | Status: DC

## 2022-10-09 MED ORDER — NEPRO/CARBSTEADY PO LIQD
237.0000 mL | Freq: Two times a day (BID) | ORAL | Status: DC
  Administered 2022-10-10 – 2022-10-13 (×4): 237 mL via ORAL

## 2022-10-09 MED ORDER — ACETAMINOPHEN 325 MG PO TABS
650.0000 mg | ORAL_TABLET | Freq: Four times a day (QID) | ORAL | Status: DC | PRN
  Administered 2022-10-23 – 2022-11-04 (×12): 650 mg via ORAL
  Filled 2022-10-09 (×12): qty 2

## 2022-10-09 MED ORDER — DARBEPOETIN ALFA 150 MCG/0.3ML IJ SOSY
150.0000 ug | PREFILLED_SYRINGE | INTRAMUSCULAR | Status: DC
  Administered 2022-10-09 – 2022-11-06 (×5): 150 ug via SUBCUTANEOUS
  Filled 2022-10-09 (×5): qty 0.3

## 2022-10-09 MED ORDER — OXYCODONE HCL 7.5 MG PO TABS: 7.5000 mg | ORAL_TABLET | ORAL | 0 refills | Status: AC | PRN

## 2022-10-09 MED ORDER — HYDRALAZINE HCL 20 MG/ML IJ SOLN: 10.0000 mg | INTRAMUSCULAR | Status: DC | PRN

## 2022-10-09 MED ORDER — GUAIFENESIN-CODEINE 100-10 MG/5ML PO SOLN
5.0000 mL | Freq: Four times a day (QID) | ORAL | Status: DC | PRN
  Administered 2022-10-11 – 2022-10-31 (×7): 5 mL via ORAL
  Filled 2022-10-09 (×7): qty 5

## 2022-10-09 MED ORDER — HEPARIN SODIUM (PORCINE) 1000 UNIT/ML DIALYSIS: 3000.0000 [IU] | Freq: Once | INTRAMUSCULAR | Status: AC

## 2022-10-09 MED ORDER — LIDOCAINE HCL (PF) 1 % IJ SOLN: 5.0000 mL | INTRAMUSCULAR | Status: DC | PRN

## 2022-10-09 MED ORDER — HEPARIN SODIUM (PORCINE) 1000 UNIT/ML DIALYSIS
1000.0000 [IU] | INTRAMUSCULAR | Status: DC | PRN
  Administered 2022-10-10: 3800 [IU]

## 2022-10-09 MED ORDER — LIDOCAINE-PRILOCAINE 2.5-2.5 % EX CREA: 1.0000 | TOPICAL_CREAM | CUTANEOUS | Status: DC | PRN

## 2022-10-09 MED ORDER — OXYCODONE HCL 5 MG PO TABS
5.0000 mg | ORAL_TABLET | ORAL | Status: DC | PRN
  Administered 2022-10-09 – 2022-11-07 (×12): 5 mg via ORAL
  Filled 2022-10-09 (×13): qty 1

## 2022-10-09 MED ORDER — ACETAMINOPHEN 325 MG PO TABS: 325.0000 mg | ORAL_TABLET | ORAL | Status: AC | PRN

## 2022-10-09 MED ORDER — HEPARIN SODIUM (PORCINE) 5000 UNIT/ML IJ SOLN
5000.0000 [IU] | Freq: Three times a day (TID) | INTRAMUSCULAR | Status: DC
  Administered 2022-10-09 – 2022-11-07 (×76): 5000 [IU] via SUBCUTANEOUS
  Filled 2022-10-09 (×81): qty 1

## 2022-10-09 MED ORDER — BISACODYL 10 MG RE SUPP: 10.0000 mg | Freq: Every day | RECTAL | Status: AC | PRN

## 2022-10-09 MED ORDER — HEPARIN SODIUM (PORCINE) 1000 UNIT/ML IJ SOLN
INTRAMUSCULAR | Status: AC
  Filled 2022-10-09: qty 4

## 2022-10-09 MED ORDER — IPRATROPIUM-ALBUTEROL 0.5-2.5 (3) MG/3ML IN SOLN: 3.0000 mL | RESPIRATORY_TRACT | Status: AC | PRN

## 2022-10-09 MED ORDER — GUAIFENESIN 100 MG/5ML PO LIQD
5.0000 mL | ORAL | Status: DC | PRN
  Administered 2022-10-13 – 2022-10-27 (×14): 5 mL via ORAL
  Filled 2022-10-09 (×2): qty 5
  Filled 2022-10-09: qty 10
  Filled 2022-10-09: qty 5
  Filled 2022-10-09 (×7): qty 10
  Filled 2022-10-09: qty 5
  Filled 2022-10-09 (×2): qty 10
  Filled 2022-10-09 (×2): qty 5

## 2022-10-09 MED ORDER — IPRATROPIUM-ALBUTEROL 0.5-2.5 (3) MG/3ML IN SOLN
3.0000 mL | RESPIRATORY_TRACT | Status: DC | PRN
  Administered 2022-10-12 (×3): 3 mL via RESPIRATORY_TRACT
  Filled 2022-10-09 (×4): qty 3

## 2022-10-09 MED ORDER — CALCITRIOL 0.5 MCG PO CAPS
0.5000 ug | ORAL_CAPSULE | ORAL | Status: DC
  Administered 2022-10-09 – 2022-10-18 (×5): 0.5 ug via ORAL
  Filled 2022-10-09: qty 1
  Filled 2022-10-09: qty 2
  Filled 2022-10-09 (×3): qty 1

## 2022-10-09 MED ORDER — CAMPHOR-MENTHOL 0.5-0.5 % EX LOTN: TOPICAL_LOTION | CUTANEOUS | Status: AC | PRN

## 2022-10-09 MED ORDER — ACETAMINOPHEN 650 MG RE SUPP: 650.0000 mg | Freq: Four times a day (QID) | RECTAL | Status: DC | PRN

## 2022-10-09 MED ORDER — ARFORMOTEROL TARTRATE 15 MCG/2ML IN NEBU: 15.0000 ug | INHALATION_SOLUTION | Freq: Two times a day (BID) | RESPIRATORY_TRACT | Status: AC

## 2022-10-09 MED ORDER — ONDANSETRON HCL 4 MG/2ML IJ SOLN: 4.0000 mg | Freq: Four times a day (QID) | INTRAMUSCULAR | Status: DC | PRN

## 2022-10-09 MED ORDER — CEFAZOLIN SODIUM-DEXTROSE 2-4 GM/100ML-% IV SOLN: 2.0000 g | INTRAVENOUS | Status: DC

## 2022-10-09 MED ORDER — METOPROLOL TARTRATE 5 MG/5ML IV SOLN
5.0000 mg | INTRAVENOUS | Status: DC | PRN
  Filled 2022-10-09: qty 5

## 2022-10-09 MED ORDER — MIDODRINE HCL 5 MG PO TABS
10.0000 mg | ORAL_TABLET | Freq: Three times a day (TID) | ORAL | Status: DC
  Administered 2022-10-09 – 2022-10-12 (×7): 10 mg via ORAL
  Filled 2022-10-09 (×8): qty 2

## 2022-10-09 MED ORDER — PIPERACILLIN-TAZOBACTAM IN DEX 2-0.25 GM/50ML IV SOLN
2.2500 g | Freq: Three times a day (TID) | INTRAVENOUS | Status: DC
  Administered 2022-10-09: 2.25 g via INTRAVENOUS
  Filled 2022-10-09 (×3): qty 50

## 2022-10-09 MED ORDER — BISACODYL 5 MG PO TBEC: 5.0000 mg | DELAYED_RELEASE_TABLET | Freq: Every day | ORAL | Status: DC | PRN

## 2022-10-09 MED ORDER — METOPROLOL TARTRATE 5 MG/5ML IV SOLN
5.0000 mg | INTRAVENOUS | Status: DC | PRN
  Administered 2022-10-09: 5 mg via INTRAVENOUS
  Filled 2022-10-09: qty 5

## 2022-10-09 MED ORDER — DARBEPOETIN ALFA 150 MCG/0.3ML IJ SOSY: 150.0000 ug | PREFILLED_SYRINGE | INTRAMUSCULAR | Status: AC

## 2022-10-09 NOTE — Progress Notes (Signed)
HD Progress Note:  1324: Around 1 hour and on treatment, noted from tech that MD came by           and ordered to increased his UF goal with additional , from existing 3.5L  1355: Noted heart rate started raising - 130's - 140's - 150's.           Stopped UF, flushed of NS, claimed "he feels his heart beat raising".           No dizziness, no nauseavomiting, no increased shortness of breath, no chest pain.           Informed PA Penninger (currently in the Unit).           Came and seen patient at bedside.           BP stable, but heart rate continues to be raised.           Now complaining heartburn as well - given TUMS per MAR.           Still with sustained high heart rate - NSR to A.Fib.           No light headedness.           Decision to call rapid response for further assessment.           EKG done at bedside.  1416: Rinse back initiated.           Rapid response team mate arrived - [place].  1427: Metoprolol IV given per MAR (first dose).           Monitored closely.  1440: Still having tachycardia.           Metoprolol IV given per MAR (second dose).           Monitored closely, no changes in mentation, alert and oriented.           Converted to Sinus rhythm.           For re-admission to in-patient, 6E room 26.  1500: Heart rates better now, <100.           BP stable.           Awaiting room to be cleaned.           Awaiting Unit RN to call back for report.  1550: Report given to [facility].           Sent patient to his room - received by her primary RN.           Vitals are in WNL.

## 2022-10-09 NOTE — Progress Notes (Addendum)
Provider Reuel Boom A made aware of patient's pulse and MAR orders for IV Lopressor if pulse greater than 110. Verbal orders to hold off giving medication at this time due to patient's blood pressure has history of being low and soft.  At this time patient is requesting to be be sat up. With that pulse oximetry does improve from 91/91 % to 96%. Respirations improve from 22 to 16 as well as sitting up in chair. Pulse does increase with sitting upright.  Endorses sensation of having a "bubble" in his chest. As well as a dry non-productive cough at this time. Rhonchi in lower left lobe. Diminished right lower lobe. Lung sounds clear in upper right and left lobe. Some pursed lip breathing initially that improved quickly as patient was upright in the wheelchair. Increase use of accessory muscles.  Does endorse sensation of being hot and warm. Diaphoretic. JVD left neck is not present at this time. Does endorse feeling anxious. Continues to have episodes of burping this morning.

## 2022-10-09 NOTE — Procedures (Signed)
Modified Barium Swallow Study  Patient Details  Name: Ryan [name] MRN: [id number] Date of Birth: 02/10/1961  Today's Date: 10/09/2022  Modified Barium Swallow completed.  Full report located under Chart Review in the Imaging Section.  History of Present Illness Pt is a 62 y/o male admitted secondary to acute back pain and found to have L5-S1 discitis/osteomyelitis, SBO and MSSA bacteremia likely 2/2 line infection. Pt had HD cath replaced with rt femoral temp cath. 09/22/22 Hemodialysis Catheter Left Subclavian Double lumen Permanent (Tunneled). 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  PMH including but not limited to hypertension, hyperlipidemia, ESRD.   Clinical Impression Overall, pt presented w/ a safe and efficient oropharyngeal swallow. With large sips, oral residue w/ subsequent freespill to BOT and valleculae was noted. However, pt demonstrated excellent sensation of this residue and was consistently able to trigger the additional swallow as needed. Otherwise, adequate pharyngeal clearance noted and no airway invasion was evident throughout trials. Cough x2 noted overall, though unrelated to airway invasion. Would recommend continued diet of regular textures and thin liquids, however, pt requested Dys 3 textures. Continue with meds whole via liquids. No dypshagia intervention required, conitnue w/ current POC for cognitive tx.      Factors that may increase risk of adverse event in presence of aspiration [name] [employer] [name] 2021): Poor general health and/or compromised immunity;Respiratory or GI disease  Swallow Evaluation Recommendations Recommendations: PO diet PO Diet Recommendation: Regular;Thin liquids (Level 0) Liquid Administration via: Straw;Cup Medication Administration: Whole meds with liquid Supervision: Patient able to self-feed Swallowing strategies  : Minimize environmental distractions;Small bites/sips;Slow  rate Postural changes: Stay upright 30-60 min after meals;Position pt fully upright for meals Oral care recommendations: Oral care BID (2x/day);Pt independent with oral care   Pati Gallo 10/09/2022,9:57 AM

## 2022-10-09 NOTE — H&P (Signed)
History and Physical    Ryan [name] GNF:[id number] DOB: 05-18-1960 DOA: 10/09/2022  PCP: Ryan Grave, MD Patient coming from: Inpatient rehab  Chief Complaint: Atrial fibrillation with RVR  HPI: Ryan [name] is a 62 y.o. male with medical history significant of HTN, HLD, ESRD on hemodialysis being admitted from CIR for new onset atrial fibrillation with RVR.  Patient initially presented to the hospital on 6/6 with lower back pain and ultimately found to have evidence of L5/S1 discitis and osteomyelitis as well as MSSA bacteremia with mitral valve endocarditis. CT scan of the chest noted septic emboli with new airspace opacities in the left upper lobe . MRI brain noting scattered subcentimeter infarcts involving the bilateral cerebral hemisphere and right cerebellum. CT surgery has been consulted but recommended conservative management as. He was already responding to antibiotics. Plan was for patient to continue on antibiotics at cefazolin until 8/13 per ID recommendations. Hospital course had also been complicated by small bowel obstruction which has since resolved. Patient had been discharged to inpatient rehab on 7/4.   While in rehab medicine team was consulted on 7/11 for cough and dyspnea.  Eventually patient was diagnosed with concerns of pneumonia/aspiration therefore was on 5 days of Zosyn and also diagnosed of fluid overload being managed with hemodialysis.  He also reported of dysphagia and had drop in hemoglobin of 6.1 with Hemoccult positive stool.  Patient was seen by GI, esophagram performed which was negative for any acute pathology.  While getting hemodialysis on 7/18 patient went into atrial fibrillation with RVR therefore being admitted to the hospital for closer monitoring.   Review of Systems: As per HPI otherwise 10 point review of systems negative.  Review of Systems Otherwise negative except as per HPI, including: General: Denies fever, chills, night  sweats or unintended weight loss. Resp: Denies cough, wheezing, shortness of breath. Cardiac: Denies chest pain, palpitations, orthopnea, paroxysmal nocturnal dyspnea. GI: Denies abdominal pain, nausea, vomiting, diarrhea or constipation GU: Denies dysuria, frequency, hesitancy or incontinence MS: Denies muscle aches, joint pain or swelling Neuro: Denies headache, neurologic deficits (focal weakness, numbness, tingling), abnormal gait Psych: Denies anxiety, depression, SI/HI/AVH Skin: Denies new rashes or lesions ID: Denies sick contacts, exotic exposures, travel  Past Medical History:  Diagnosis Date   Allergy    Anemia    Blood transfusion without reported diagnosis    Dialysis patient (HCC)    Tues,thurs,sat   ESRD (end stage renal disease) (HCC)    TTHS-    Family history of adverse reaction to anesthesia    father had allergic reaction with anesthesia with a dental procedure-(pt. doesn't know)   Hyperlipidemia    Hypertension     Past Surgical History:  Procedure Laterality Date   A/V FISTULAGRAM Left 12/15/2016   Procedure: A/V Fistulagram - left;  Surgeon: Chuck Hint, MD;  Location: [facility] INVASIVE CV LAB;  Service: Cardiovascular;  Laterality: Left;   AV FISTULA PLACEMENT Left 08/28/2016   Procedure: LEFT UPPER  ARM ARTERIOVENOUS (AV) FISTULA CREATION;  Surgeon: Chuck Hint, MD;  Location: [facility] OR;  Service: Vascular;  Laterality: Left;   DIALYSIS/PERMA CATHETER INSERTION Right 12/21/2017   Procedure: INSERTION OF DIALYSIS CATHETER Right Internal Jugular .;  Surgeon: Sherren Kerns, MD;  Location: [facility] OR;  Service: Vascular;  Laterality: Right;   FISTULA SUPERFICIALIZATION Left 09/23/2017   Procedure: FISTULA PLICATION LEFT ARM;  Surgeon: Sherren Kerns, MD;  Location: [facility] OR;  Service: Vascular;  Laterality: Left;   FISTULOGRAM  Left 12/21/2017   Procedure: FISTULOGRAM with Balloon Angioplasty.;  Surgeon: Sherren Kerns, MD;  Location: [facility] OR;   Service: Vascular;  Laterality: Left;   HEMATOMA EVACUATION Left 08/27/2020   Procedure: EVACUATION HEMATOMA LEFT ARM;  Surgeon: Sherren Kerns, MD;  Location: [facility] OR;  Service: Vascular;  Laterality: Left;   IR FLUORO GUIDE CV LINE LEFT  09/22/2022   IR FLUORO GUIDE CV LINE RIGHT  09/12/2022   IR FLUORO GUIDE CV LINE RIGHT  09/17/2022   IR REMOVAL TUN CV CATH W/O FL  09/09/2022   IR THORACENTESIS ASP PLEURAL SPACE W/IMG GUIDE  08/22/2017   1.2 L -right-sided   IR THORACENTESIS ASP PLEURAL SPACE W/IMG GUIDE  10/19/2017   IR US GUIDE VASC ACCESS LEFT  09/22/2022   IR US GUIDE VASC ACCESS RIGHT  09/12/2022   KIDNEY TRANSPLANT     REVISON OF ARTERIOVENOUS FISTULA Left 12/21/2017   Procedure: PLICATION and Ligation of F LEFT ARM ARTERIOVENOUS FISTULA;  Surgeon: Sherren Kerns, MD;  Location: [facility] OR;  Service: Vascular;  Laterality: Left;   REVISON OF ARTERIOVENOUS FISTULA Left 07/16/2020   Procedure: EXCISION OF LEFT ARM ARTERIOVENOUS FISTULA;  Surgeon: Sherren Kerns, MD;  Location: [facility] OR;  Service: Vascular;  Laterality: Left;   stent in kidneys     dec 2017   TRANSTHORACIC ECHOCARDIOGRAM  09/22/2017    Severe LVH.  Normal function -EF 55-60%.  GRII DD.  Moderate RV dilation with mildly reduced RV function.   Fixed right coronary cusp with very mild aortic stenosis.  The myocardium has a speckled appearance --> .   Recommend cardiac MRI to evaluate for amyloid   UPPER EXTREMITY VENOGRAPHY Bilateral 06/01/2020   Procedure: UPPER EXTREMITY VENOGRAPHY;  Surgeon: Chuck Hint, MD;  Location: [facility] INVASIVE CV LAB;  Service: Cardiovascular;  Laterality: Bilateral;    SOCIAL HISTORY:  reports that he has never smoked. He has never used smokeless tobacco. He reports that he does not drink alcohol and does not use drugs.  Allergies  Allergen Reactions   Iodinated Contrast Media Itching and Nausea Only    Probably needs prednisone prep prior to contrast   Z-Pak [Azithromycin] Nausea And  Vomiting and Other (See Comments)    Chest tightness GI Intolerance    FAMILY HISTORY: Family History  Problem Relation Age of Onset   Hypertension Mother    Heart disease Mother 49       By his report, he thinks that she had heart attack.   Stroke Mother    Cancer Father    Kidney cancer Father    Hypertension Sister    Heart disease Maternal Grandmother    Alcohol abuse Maternal Grandfather    Mental illness Paternal Grandmother    Learning disabilities Paternal Grandmother        Alzheimer's    Stroke Paternal Grandfather    Colon cancer Neg Hx    Colon polyps Neg Hx    Esophageal cancer Neg Hx    Rectal cancer Neg Hx    Stomach cancer Neg Hx      Prior to Admission medications   Medication Sig Start Date End Date Taking? Authorizing Provider  acetaminophen (TYLENOL) 325 MG tablet Take 1-2 tablets (325-650 mg total) by mouth every 4 (four) hours as needed for mild pain. 10/09/22   Angiulli, Mcarthur Rossetti, PA-C  arformoterol (BROVANA) 15 MCG/2ML NEBU Take 2 mLs (15 mcg total) by nebulization 2 (two) times daily. 10/09/22   Angiulli,  Mcarthur Rossetti, PA-C  benzonatate (TESSALON) 100 MG capsule Take 1 capsule (100 mg total) by mouth 2 (two) times daily. 10/09/22   Angiulli, Mcarthur Rossetti, PA-C  bisacodyl (DULCOLAX) 10 MG suppository Place 1 suppository (10 mg total) rectally daily as needed for moderate constipation. 10/09/22   Angiulli, Mcarthur Rossetti, PA-C  calcitRIOL (ROCALTROL) 0.5 MCG capsule Take 1 capsule (0.5 mcg total) by mouth every Tuesday, Thursday, and Saturday at 6 PM. 09/25/22   Dorcas Carrow, MD  calcium carbonate (TUMS - DOSED IN MG ELEMENTAL CALCIUM) 500 MG chewable tablet Chew 1 tablet (200 mg of elemental calcium total) by mouth 4 (four) times daily as needed for indigestion or heartburn. 10/09/22   Angiulli, Mcarthur Rossetti, PA-C  camphor-menthol [facility]) lotion Apply topically as needed for itching. 10/09/22   Angiulli, Mcarthur Rossetti, PA-C  ceFAZolin (ANCEF) 2-4 GM/100ML-% IVPB Inject 100 mLs (2 g  total) into the vein every Tuesday, Thursday, and Saturday at 6 PM. 10/14/22   Angiulli, Mcarthur Rossetti, PA-C  ceFAZolin (ANCEF) IVPB Inject 2 g into the vein Every Tuesday,Thursday,and Saturday with dialysis. Indication:  MSSA discitis/osteo First Dose: Yes Last Day of Therapy:  11/04/22 Labs - Once weekly:  CBC/D and BMP, ESR and CRP Method of administration: Per HD protocol Method of administration may be changed at the discretion of home infusion pharmacist based upon assessment of the patient and/or caregiver's ability to self-administer the medication ordered. 09/16/22 11/04/22  Danelle Earthly, MD  cinacalcet (SENSIPAR) 30 MG tablet Take 30 mg by mouth at bedtime. Patient not taking: Reported on 09/08/2022 12/08/17   [provider]  Darbepoetin Alfa (ARANESP) 150 MCG/0.3ML SOSY injection Inject 0.3 mLs (150 mcg total) into the skin every Thursday at 6pm. 10/09/22   Angiulli, Mcarthur Rossetti, PA-C  guaiFENesin (MUCINEX) 600 MG 12 hr tablet Take 1 tablet (600 mg total) by mouth 2 (two) times daily. 10/09/22   Angiulli, Mcarthur Rossetti, PA-C  ipratropium-albuterol (DUONEB) 0.5-2.5 (3) MG/3ML SOLN Take 3 mLs by nebulization every 4 (four) hours as needed. 10/09/22   Angiulli, Mcarthur Rossetti, PA-C  methocarbamol (ROBAXIN) 500 MG tablet Take 0.5 tablets (250 mg total) by mouth 2 (two) times daily. 10/09/22   Angiulli, Mcarthur Rossetti, PA-C  midodrine (PROAMATINE) 10 MG tablet Take 1 tablet (10 mg total) by mouth 3 (three) times daily with meals. 10/09/22   Angiulli, Mcarthur Rossetti, PA-C  multivitamin (RENA-VIT) TABS tablet Take 1 tablet by mouth daily.    [provider]  oxyCODONE 7.5 MG TABS Take 7.5-10 mg by mouth every 4 (four) hours as needed for moderate pain or severe pain. 10/09/22   Angiulli, Mcarthur Rossetti, PA-C  pantoprazole (PROTONIX) 40 MG tablet Take 1 tablet (40 mg total) by mouth daily. 09/25/22   Dorcas Carrow, MD  revefenacin (YUPELRI) 175 MCG/3ML nebulizer solution Take 3 mLs (175 mcg total) by nebulization daily.  10/10/22   Angiulli, Mcarthur Rossetti, PA-C  sucroferric oxyhydroxide (VELPHORO) 500 MG chewable tablet Chew 2 tablets (1,000 mg total) by mouth 3 (three) times daily with meals. 09/25/22   Dorcas Carrow, MD    Physical Exam: Vitals:   10/09/22 1608  BP: 103/72  Pulse: (!) 101  Resp: 20  Temp: 97.7 F (36.5 C)  TempSrc: Oral  Weight: 81 kg  Height: 5\' 9"  (1.753 m)      Constitutional: NAD, calm, comfortable Eyes: PERRL, lids and conjunctivae normal ENMT: Mucous membranes are moist. Posterior pharynx clear of any exudate or lesions.Normal dentition.  Neck: normal, supple, no masses,  no thyromegaly Respiratory: Bibasilar rhonchi Cardiovascular: Irregularly irregular, no murmurs / rubs / gallops. No extremity edema. 2+ pedal pulses. No carotid bruits.  Abdomen: no tenderness, no masses palpated. No hepatosplenomegaly. Bowel sounds positive.  Musculoskeletal: no clubbing / cyanosis. No joint deformity upper and lower extremities. Good ROM, no contractures. Normal muscle tone.  Skin: no rashes, lesions, ulcers. No induration Neurologic: CN 2-12 grossly intact. Sensation intact, DTR normal. Strength 5/5 in all 4.  Psychiatric: Normal judgment and insight. Alert and oriented x 3. Normal mood.     Labs on Admission: I have personally reviewed following labs and imaging studies  CBC: Recent Labs  Lab 10/05/22 2023 10/06/22 0637 10/07/22 0548 10/08/22 0605 10/09/22 1138  WBC 14.7* 13.8* 12.9* 13.7* 14.3*  NEUTROABS 12.3* 11.1* 10.3*  --   --   HGB 7.0* 6.3* 6.1* 7.0* 7.7*  HCT 22.8* 21.3* 20.0* 22.6* 24.8*  MCV 83.2 83.5 82.6 81.3 81.8  PLT 344 321 358 349 377   Basic Metabolic Panel: Recent Labs  Lab 10/03/22 0629 10/04/22 0553 10/05/22 0531 10/05/22 2023 10/06/22 0637 10/08/22 0605 10/09/22 1138  NA 128* 128* 130* 129* 129* 127* 124*  K 4.0 4.1 3.6 3.8 3.8 3.7 3.9  CL 92* 93* 94* 89* 92* 92* 88*  CO2 23 24 26 25 24 25 24   GLUCOSE 87 107* 106* 121* 100* 108* 115*  BUN  27* 39* 27* 36* 41* 30* 40*  CREATININE 5.67* 7.44* 5.79* 6.93* 7.89* 6.04* 7.42*  CALCIUM 7.8* 8.1* 8.2* 8.5* 8.2* 7.8* 7.9*  PHOS 3.8 3.8 3.9  --  4.1  --  3.8   GFR: Estimated Creatinine Clearance: 10.5 mL/min (A) (by C-G formula based on SCr of 7.42 mg/dL (H)). Liver Function Tests: Recent Labs  Lab 10/03/22 0629 10/04/22 0553 10/05/22 0531 10/06/22 0637 10/09/22 1138  ALBUMIN 1.6* 1.7* 1.8* 1.6* 1.9*   No results for input(s): "LIPASE", "AMYLASE" in the last 168 hours. No results for input(s): "AMMONIA" in the last 168 hours. Coagulation Profile: No results for input(s): "INR", "PROTIME" in the last 168 hours. Cardiac Enzymes: No results for input(s): "CKTOTAL", "CKMB", "CKMBINDEX", "TROPONINI" in the last 168 hours. BNP (last 3 results) No results for input(s): "PROBNP" in the last 8760 hours. HbA1C: No results for input(s): "HGBA1C" in the last 72 hours. CBG: No results for input(s): "GLUCAP" in the last 168 hours. Lipid Profile: No results for input(s): "CHOL", "HDL", "LDLCALC", "TRIG", "CHOLHDL", "LDLDIRECT" in the last 72 hours. Thyroid Function Tests: No results for input(s): "TSH", "T4TOTAL", "FREET4", "T3FREE", "THYROIDAB" in the last 72 hours. Anemia Panel: No results for input(s): "VITAMINB12", "FOLATE", "FERRITIN", "TIBC", "IRON", "RETICCTPCT" in the last 72 hours. Urine analysis:    Component Value Date/Time   COLORURINE RED (A) 07/12/2017 0940   APPEARANCEUR CLOUDY (A) 07/12/2017 0940   LABSPEC  07/12/2017 0940    TEST NOT REPORTED DUE TO COLOR INTERFERENCE OF URINE PIGMENT   PHURINE  07/12/2017 0940    TEST NOT REPORTED DUE TO COLOR INTERFERENCE OF URINE PIGMENT   GLUCOSEU (A) 07/12/2017 0940    TEST NOT REPORTED DUE TO COLOR INTERFERENCE OF URINE PIGMENT   HGBUR (A) 07/12/2017 0940    TEST NOT REPORTED DUE TO COLOR INTERFERENCE OF URINE PIGMENT   BILIRUBINUR (A) 07/12/2017 0940    TEST NOT REPORTED DUE TO COLOR INTERFERENCE OF URINE PIGMENT    KETONESUR (A) 07/12/2017 0940    TEST NOT REPORTED DUE TO COLOR INTERFERENCE OF URINE PIGMENT   PROTEINUR (A) 07/12/2017  0940    TEST NOT REPORTED DUE TO COLOR INTERFERENCE OF URINE PIGMENT   NITRITE (A) 07/12/2017 0940    TEST NOT REPORTED DUE TO COLOR INTERFERENCE OF URINE PIGMENT   LEUKOCYTESUR (A) 07/12/2017 0940    TEST NOT REPORTED DUE TO COLOR INTERFERENCE OF URINE PIGMENT   Sepsis Labs: !!!!!!!!!!!!!!!!!!!!!!!!!!!!!!!!!!!!!!!!!!!! @LABRCNTIP (procalcitonin:4,lacticidven:4) ) Recent Results (from the past 240 hour(s))  Culture, blood (Routine X 2) w Reflex to ID Panel     Status: None (Preliminary result)   Collection Time: 10/05/22  8:23 PM   Specimen: BLOOD  Result Value Ref Range Status   Specimen Description BLOOD BLOOD RIGHT HAND  Final   Special Requests   Final    BOTTLES DRAWN AEROBIC AND ANAEROBIC Blood Culture adequate volume   Culture   Final    NO GROWTH 4 DAYS Performed at [facility] Lab, [address], [place], [place] [address]    Report Status PENDING  Incomplete  Culture, blood (Routine X 2) w Reflex to ID Panel     Status: None (Preliminary result)   Collection Time: 10/05/22  8:32 PM   Specimen: BLOOD  Result Value Ref Range Status   Specimen Description BLOOD BLOOD RIGHT ARM  Final   Special Requests   Final    BOTTLES DRAWN AEROBIC AND ANAEROBIC Blood Culture adequate volume   Culture   Final    NO GROWTH 4 DAYS Performed at [facility], [address], [place], [place] [address]    Report Status PENDING  Incomplete  Respiratory (~20 pathogens) panel by PCR     Status: None   Collection Time: 10/06/22  1:58 AM   Specimen: Nasopharyngeal Swab; Respiratory  Result Value Ref Range Status   Adenovirus NOT DETECTED NOT DETECTED Final   Coronavirus 229E NOT DETECTED NOT DETECTED Final    Comment: (NOTE) The Coronavirus on the Respiratory Panel, DOES NOT test for the novel  Coronavirus (2019 nCoV)    Coronavirus HKU1 NOT DETECTED NOT  DETECTED Final   Coronavirus NL63 NOT DETECTED NOT DETECTED Final   Coronavirus OC43 NOT DETECTED NOT DETECTED Final   Metapneumovirus NOT DETECTED NOT DETECTED Final   Rhinovirus / Enterovirus NOT DETECTED NOT DETECTED Final   Influenza A NOT DETECTED NOT DETECTED Final   Influenza B NOT DETECTED NOT DETECTED Final   Parainfluenza Virus 1 NOT DETECTED NOT DETECTED Final   Parainfluenza Virus 2 NOT DETECTED NOT DETECTED Final   Parainfluenza Virus 3 NOT DETECTED NOT DETECTED Final   Parainfluenza Virus 4 NOT DETECTED NOT DETECTED Final   Respiratory Syncytial Virus NOT DETECTED NOT DETECTED Final   Bordetella pertussis NOT DETECTED NOT DETECTED Final   Bordetella Parapertussis NOT DETECTED NOT DETECTED Final   Chlamydophila pneumoniae NOT DETECTED NOT DETECTED Final   Mycoplasma pneumoniae NOT DETECTED NOT DETECTED Final    Comment: Performed at [facility], [address], [place], [place] [address]  SARS Coronavirus 2 by RT PCR (hospital order, performed in [facility] hospital lab) *cepheid single result test* Anterior Nasal Swab     Status: None   Collection Time: 10/06/22  1:58 AM   Specimen: Anterior Nasal Swab  Result Value Ref Range Status   SARS Coronavirus 2 by RT PCR NEGATIVE NEGATIVE Final    Comment: Performed at [facility] Lab, [address] [place], [place] [address]     Radiological Exams on Admission: DG Swallowing Func-Speech Pathology  Result Date: 10/09/2022 Table formatting from  the original result was not included. Modified Barium Swallow Study Patient Details Name: Ryan [name] MRN: [id number] Date of Birth: 1960-09-27 Today's Date: 10/09/2022 HPI/PMH: HPI: Pt is a 62 y/o male admitted secondary to acute back pain and found to have L5-S1 discitis/osteomyelitis, SBO and MSSA bacteremia likely 2/2 line infection. Pt had HD cath replaced with rt femoral temp cath. 09/22/22 Hemodialysis Catheter Left Subclavian Double lumen Permanent (Tunneled). Note new  findings on MRI of scattered infarcts on B cerebral hemispheres, and R cerebellar infarct.  PMH including but not limited to hypertension, hyperlipidemia, ESRD. Clinical Impression: Clinical Impression: Overall, pt presented w/ a safe and efficient oropharyngeal swallow. With large sips, oral residue w/ subsequent freespill to BOT and valleculae was noted. However, pt demonstrated excellent sensation of this residue and was consistently able to trigger the additional swallow as needed. Otherwise, adequate pharyngeal clearance noted and no airway invasion was evident throughout trials. Cough x2 noted overall, though unrelated to airway invasion. Would recommend continued diet of regular textures and thin liquids, however, pt requested Dys 3 textures. Continue with meds whole via liquids. No dypshagia intervention required, conitnue w/ current POC for cognitive tx. Factors that may increase risk of adverse event in presence of aspiration [name] & [name] 2021): Factors that may increase risk of adverse event in presence of aspiration [name] & [name] 2021): Poor general health and/or compromised immunity; Respiratory or GI disease Recommendations/Plan: Swallowing Evaluation Recommendations Swallowing Evaluation Recommendations Recommendations: PO diet PO Diet Recommendation: Regular; Thin liquids (Level 0) Liquid Administration via: Straw; Cup Medication Administration: Whole meds with liquid Supervision: Patient able to self-feed Swallowing strategies  : Minimize environmental distractions; Small bites/sips; Slow rate Postural changes: Stay upright 30-60 min after meals; Position pt fully upright for meals Oral care recommendations: Oral care BID (2x/day); Pt independent with oral care Treatment Plan Treatment Plan Treatment recommendations: Therapy as outlined in treatment plan below Recommendations Comment: no dysphagia intervention required, conitnue w/ current POC for cognition Treatment frequency: Min 1x/week  Treatment duration: 1 week Interventions: Other (comment) (no dysphagia intervention - continue w/ cog tx only) Recommendations Recommendations for follow up therapy are one component of a multi-disciplinary discharge planning process, led by the attending physician.  Recommendations may be updated based on patient status, additional functional criteria and insurance authorization. Assessment: Orofacial Exam: Orofacial Exam Oral Cavity: Oral Hygiene: WFL Oral Cavity - Dentition: Adequate natural dentition Orofacial Anatomy: WFL Oral Motor/Sensory Function: WFL Anatomy: Anatomy: WFL Boluses Administered: Boluses Administered Boluses Administered: Thin liquids (Level 0); Mildly thick liquids (Level 2, nectar thick); Puree; Solid  Oral Impairment Domain: Oral Impairment Domain Lip Closure: No labial escape Tongue control during bolus hold: Cohesive bolus between tongue to palatal seal Bolus preparation/mastication: Timely and efficient chewing and mashing Bolus transport/lingual motion: Brisk tongue motion Oral residue: Trace residue lining oral structures Location of oral residue : Tongue Initiation of pharyngeal swallow : Pyriform sinuses  Pharyngeal Impairment Domain: Pharyngeal Impairment Domain Soft palate elevation: No bolus between soft palate (SP)/pharyngeal wall (PW) Laryngeal elevation: Complete superior movement of thyroid cartilage with complete approximation of arytenoids to epiglottic petiole Anterior hyoid excursion: Complete anterior movement Epiglottic movement: Complete inversion Laryngeal vestibule closure: Complete, no air/contrast in laryngeal vestibule Pharyngeal stripping wave : Present - complete Pharyngeal contraction (A/P view only): N/A Pharyngoesophageal segment opening: Complete distension and complete duration, no obstruction of flow Tongue base retraction: No contrast between tongue base and posterior pharyngeal wall (PPW) Pharyngeal residue: Trace residue within or on pharyngeal  structures  Location of pharyngeal residue: Valleculae; Tongue base  Esophageal Impairment Domain: Esophageal Impairment Domain Esophageal clearance upright position: Esophageal retention Pill: No data recorded Penetration/Aspiration Scale Score: Penetration/Aspiration Scale Score 1.  Material does not enter airway: Thin liquids (Level 0); Mildly thick liquids (Level 2, nectar thick); Puree; Solid Compensatory Strategies: Compensatory Strategies Compensatory strategies: Yes Straw: Effective Effective Straw: Thin liquid (Level 0); Mildly thick liquid (Level 2, nectar thick) Multiple swallows: Effective Effective Multiple Swallows: Thin liquid (Level 0); Mildly thick liquid (Level 2, nectar thick)   General Information: Caregiver present: No  Diet Prior to this Study: Regular; Thin liquids (Level 0)   Temperature : Normal   Respiratory Status: WFL   Supplemental O2: Nasal cannula (3L O2)   History of Recent Intubation: No  Behavior/Cognition: Alert; Cooperative; Pleasant mood Self-Feeding Abilities: Able to self-feed Baseline vocal quality/speech: Normal Volitional Cough: Able to elicit Volitional Swallow: Able to elicit Exam Limitations: No limitations Goal Planning: No data recorded No data recorded No data recorded No data recorded Consulted and agree with results and recommendations: Patient; Nurse; Physician Pain: Pain Assessment Pain Assessment: No/denies pain Pain Score: 0 End of Session: Start Time:No data recorded Stop Time: No data recorded Time Calculation:No data recorded Charges: No data recorded SLP visit diagnosis: No data recorded Past Medical History: Past Medical History: Diagnosis Date  Allergy   Anemia   Blood transfusion without reported diagnosis   Dialysis patient (HCC)   Tues,thurs,sat  ESRD (end stage renal disease) (HCC)   TTHS-   Family history of adverse reaction to anesthesia   father had allergic reaction with anesthesia with a dental procedure-(pt. doesn't know)  Hyperlipidemia    Hypertension  Past Surgical History: Past Surgical History: Procedure Laterality Date  A/V FISTULAGRAM Left 12/15/2016  Procedure: A/V Fistulagram - left;  Surgeon: Chuck Hint, MD;  Location: [facility] INVASIVE CV LAB;  Service: Cardiovascular;  Laterality: Left;  AV FISTULA PLACEMENT Left 08/28/2016  Procedure: LEFT UPPER  ARM ARTERIOVENOUS (AV) FISTULA CREATION;  Surgeon: Chuck Hint, MD;  Location: [facility] OR;  Service: Vascular;  Laterality: Left;  DIALYSIS/PERMA CATHETER INSERTION Right 12/21/2017  Procedure: INSERTION OF DIALYSIS CATHETER Right Internal Jugular .;  Surgeon: Sherren Kerns, MD;  Location: [facility] OR;  Service: Vascular;  Laterality: Right;  FISTULA SUPERFICIALIZATION Left 09/23/2017  Procedure: FISTULA PLICATION LEFT ARM;  Surgeon: Sherren Kerns, MD;  Location: [facility] OR;  Service: Vascular;  Laterality: Left;  FISTULOGRAM Left 12/21/2017  Procedure: FISTULOGRAM with Balloon Angioplasty.;  Surgeon: Sherren Kerns, MD;  Location: [facility] OR;  Service: Vascular;  Laterality: Left;  HEMATOMA EVACUATION Left 08/27/2020  Procedure: EVACUATION HEMATOMA LEFT ARM;  Surgeon: Sherren Kerns, MD;  Location: [facility] OR;  Service: Vascular;  Laterality: Left;  IR FLUORO GUIDE CV LINE LEFT  09/22/2022  IR FLUORO GUIDE CV LINE RIGHT  09/12/2022  IR FLUORO GUIDE CV LINE RIGHT  09/17/2022  IR REMOVAL TUN CV CATH W/O FL  09/09/2022  IR THORACENTESIS ASP PLEURAL SPACE W/IMG GUIDE  08/22/2017  1.2 L -right-sided  IR THORACENTESIS ASP PLEURAL SPACE W/IMG GUIDE  10/19/2017  IR US GUIDE VASC ACCESS LEFT  09/22/2022  IR US GUIDE VASC ACCESS RIGHT  09/12/2022  KIDNEY TRANSPLANT    REVISON OF ARTERIOVENOUS FISTULA Left 12/21/2017  Procedure: PLICATION and Ligation of F LEFT ARM ARTERIOVENOUS FISTULA;  Surgeon: Sherren Kerns, MD;  Location: [facility] OR;  Service: Vascular;  Laterality: Left;  REVISON OF ARTERIOVENOUS FISTULA Left 07/16/2020  Procedure: EXCISION OF  LEFT ARM ARTERIOVENOUS FISTULA;  Surgeon: Sherren Kerns,  MD;  Location: [facility] OR;  Service: Vascular;  Laterality: Left;  stent in kidneys    dec 2017  TRANSTHORACIC ECHOCARDIOGRAM  09/22/2017   Severe LVH.  Normal function -EF 55-60%.  GRII DD.  Moderate RV dilation with mildly reduced RV function.   Fixed right coronary cusp with very mild aortic stenosis.  The myocardium has a speckled appearance --> .   Recommend cardiac MRI to evaluate for amyloid  UPPER EXTREMITY VENOGRAPHY Bilateral 06/01/2020  Procedure: UPPER EXTREMITY VENOGRAPHY;  Surgeon: Chuck Hint, MD;  Location: [facility] INVASIVE CV LAB;  Service: Cardiovascular;  Laterality: Bilateral; Pati Gallo 10/09/2022, 10:11 AM  DG CHEST PORT 1 VIEW  Result Date: 10/09/2022 CLINICAL DATA:  Recent pneumonia, low O2 sats with shortness of breath. EXAM: PORTABLE CHEST 1 VIEW COMPARISON:  Chest radiograph 10/05/2022 FINDINGS: The left-sided vascular catheter is stable with tip terminating in the right atrium. The heart is enlarged, unchanged. The upper mediastinal contours are prominent, also unchanged Lung volumes are low. There is worsened opacity in the right midlung. There is probable mild bibasilar subsegmental atelectasis. There is vascular congestion and probable mild pulmonary interstitial edema, slightly worsened. There is no significant pleural effusion there is no pneumothorax. There is no acute osseous abnormality. IMPRESSION: Cardiomegaly with vascular congestion and probable mild pulmonary interstitial edema, slightly worsened. More focal airspace opacity in the right midlung could reflect superimposed infection in the correct clinical setting. Electronically Signed   By: Lesia Hausen M.D.   On: 10/09/2022 08:44     All images have been reviewed by me personally.  EKG: Independently reviewed. 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  But this is a new onset therefore we will check TSH.  Recent echocardiogram reviewed.  Will consult cardiology.  IV as needed Lopressor for now.  Will repeat another EKG.  Will continue to monitor and optimize electrolytes.  For now holding off on anticoagulation unless recommended by Cardiology.   Acute respiratory distress - Combination of fluid overload and recovering from pneumonia -Completing course of Zosyn, will transition back to cefazolin.  Last day of cefazolin is 8/13.  As needed bronchodilators, I-S/flutter valve.  Acute diastolic congestive heart failure, class III -Recent echocardiogram shows EF of 60%.  Patient does not produce any urine.  Volume status will be managed by hemodialysis.  If he does well, possible another session tomorrow.  Dysphagia - Have esophagram which was negative.  Speech and swallow recommending D3 diet.  Anemia of chronic disease - Baseline hemoglobin 8.0 which has drifted down to 6.1 requiring 1 unit PRBC transfusion on 7/15.  Continue PPI.  LB GI following.  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  Plan the last day 8/13     DVT prophylaxis: Subcu heparin Code Status: Full code Family Communication:  Consults called: [facility] cardiology consulted, nephrology will continue to follow Admission status: Inpatient admission to telemetry  Status is: Inpatient Remains inpatient appropriate because: Hospital stay for new onset  atrial fibrillation with RVR   Time Spent: 65 minutes.  >50% of the time was devoted to discussing the patients care, assessment, plan and disposition with other care givers along with counseling the patient  about the risks and benefits of treatment.    Galo Sayed Joline Maxcy MD [facility]  If 7PM-7AM, please contact night-coverage   10/09/2022, 4:18 PM

## 2022-10-09 NOTE — Progress Notes (Signed)
Patient to be transferred to 6E. Report given to [facility].

## 2022-10-09 NOTE — Significant Event (Signed)
Rapid Response Event Note   Reason for Call :  Afib RVR  Initial Focused Assessment:  On arrival, pt laying in bed A&O c/o chest discomfort and feeling slightly lightheaded.   Vitals: HR 155, BP 125/91, RR 31, spO2 100% on 4L [place].   Lopressor IV given with HR decreasing to 120-130s  Md notified and additional Lopressor dose given with HR improving to 90s- SR  Interventions:  EKG- Afib RVR 5mg  Lopressor IV x2   Plan of Care:  Will admit pt to PCU unit for cardiac monitoring.    Event Summary:   MD Notified: Dr Nelson Chimes and Lov[place] notified Call Time: 1415 Arrival Time: 1418 End Time: 1510  Mordecai Rasmussen, RN

## 2022-10-09 NOTE — Procedures (Signed)
I was present at this dialysis session. I have reviewed the session itself and made appropriate changes.   Filed Weights   10/09/22 0600 10/09/22 1135 10/09/22 1151  Weight: 82.6 kg 83 kg 83 kg    Recent Labs  Lab 10/09/22 1138  NA 124*  K 3.9  CL 88*  CO2 24  GLUCOSE 115*  BUN 40*  CREATININE 7.42*  CALCIUM 7.9*  PHOS 3.8    Recent Labs  Lab 10/05/22 2023 10/06/22 0637 10/07/22 0548 10/08/22 0605 10/09/22 1138  WBC 14.7* 13.8* 12.9* 13.7* 14.3*  NEUTROABS 12.3* 11.1* 10.3*  --   --   HGB 7.0* 6.3* 6.1* 7.0* 7.7*  HCT 22.8* 21.3* 20.0* 22.6* 24.8*  MCV 83.2 83.5 82.6 81.3 81.8  PLT 344 321 358 349 377    Scheduled Meds:  (feeding supplement) PROSource Plus  30 mL Oral TID BM   arformoterol  15 mcg Nebulization BID   benzonatate  100 mg Oral BID   calcitRIOL  0.5 mcg Oral Q T,Th,Sat-1800   Chlorhexidine Gluconate Cloth  6 each Topical Q12H   Chlorhexidine Gluconate Cloth  6 each Topical Q0600   cinacalcet  30 mg Oral Q supper   darbepoetin (ARANESP) injection - DIALYSIS  150 mcg Subcutaneous Q Thu-1800   feeding supplement (NEPRO CARB STEADY)  237 mL Oral TID BM   fluticasone  1 spray Each Nare Daily   guaiFENesin  600 mg Oral BID   loratadine  10 mg Oral Daily   methocarbamol  250 mg Oral BID   midodrine  10 mg Oral TID WC   multivitamin  1 tablet Oral QHS   pantoprazole  40 mg Oral BID AC   revefenacin  175 mcg Nebulization Daily   sucroferric oxyhydroxide  1,000 mg Oral TID WC   traZODone  150 mg Oral QHS   Continuous Infusions:  [START ON 10/14/2022]  ceFAZolin (ANCEF) IV     piperacillin-tazobactam (ZOSYN)  IV Stopped (10/09/22 0956)   PRN Meds:.acetaminophen, albuterol, bisacodyl, calcium carbonate, camphor-menthol, chlorproMAZINE, diphenhydrAMINE, guaiFENesin-codeine, guaiFENesin-dextromethorphan, hydrALAZINE, ipratropium-albuterol, lip balm, melatonin, menthol-cetylpyridinium, metoprolol tartrate, milk and molasses, oxyCODONE, phenol, polyethylene  glycol, senna, simethicone   Anthony Sar, MD [facility] 10/09/2022, 1:33 PM

## 2022-10-09 NOTE — Plan of Care (Addendum)
Ryan [name] 27-Jun-1960 [id number]  Patient converted to A fib during dialysis treatment with complaints of headache and burning sensation in chest.  Goal reduced with no improvement.    HR 110-170s.  EKG with A fib w/RVR. Contacted Dr. Thedore Mins.  Patient rinsed back and rapid response called.    Virgina Norfolk, PA-C [employer]

## 2022-10-09 NOTE — Progress Notes (Signed)
Patient educated on signs and symptoms of a blood transfusion reaction and to notify RN. Patient verbalized understanding.

## 2022-10-09 NOTE — Progress Notes (Signed)
PROGRESS NOTE    KYLYN SOOKRAM  ZOX:[id number] DOB: 19-Jul-1960 DOA: 09/25/2022 PCP: Sharin Grave, MD   Brief Narrative:  62 y.o. male with past medical history of hypertension, hyperlipidemia, and ESRD on hemodialysis who initially presented to the hospital on 6/6 with complaints of low back pain ultimately found to have evidence of L5/S1 discitis and osteomyelitis as well as MSSA bacteremia with mitral valve endocarditis. CT scan of the chest noted septic emboli with new airspace opacities in the left upper lobe . MRI brain noting scattered subcentimeter infarcts involving the bilateral cerebral hemisphere and right cerebellum. CT surgery has been consulted but recommended conservative management as. He was already responding to antibiotics. Plan was for patient to continue on antibiotics at cefazolin until 8/13 per ID recommendations. Hospital course had also been complicated by small bowel obstruction which has since resolved. Patient had been discharged to inpatient rehab on 7/4.   Medical team consulted for intermittent cough and possibly choking on food.  Chest x-ray had poor inspiratory effort.  Labs showed hyponatremia.  Patient has also had some signs of fluid overload and getting dialysis.   Assessment & Plan:  Principal Problem:   Acute osteomyelitis of lumbar spine (HCC) Active Problems:   Leukocytosis   Cough   ESRD on dialysis (HCC)   Hyperkalemia   Diskitis      Acute hypoxemic respiratory failure -Continue to wean off oxygen, currently on 2 L nasal cannula.  RVP and COVID-19 negative.  Procalcitonin 5.04.  Currently on empiric Zosyn, will plan total of 5 days thereafter transition back to cefazolin.  I-S/flutter valve.  Order bronchodilators scheduled and as needed. -Speech and swallow following-D3 diet  Acute diastolic CHF with pulmonary edema, EF 60% - Signs of volume overload with orthopnea.  Patient does not produce urine, discussed with nephrology who will plan  on aggressively dialyzing him today and if necessary will arrange for another session tomorrow as well.   Anemia of chronic disease -Baseline hemoglobin around 8.0, slowly drifted down to 6.1 over several days.  Hemoccult positive.  Status post 1 unit PRBC 7/15.  On PPI.  Seen by [facility]  Dysphagia - Barium swallow performed which does not show any obvious signs of gross aspirations.   Tachycardia -Question if he had A-fib during dialysis 7/13  -EKG reviewed independently shows sinus tachycardia with rate 105   Other medical issues including: L5/S1 discitis and osteomyelitis, MSSA bacteremia with mitral valve endocarditis, septic emboli to the lungs, septic emboli to the brain Persistent leukocytosis in setting of infection as above ESRD on dialysis with chronic hyponatremia Anemia of chronic disease Small bowel obstruction, resolved -Plan cefazolin until 8/13 (FOR NOW ON ZOSYN FOR 5 DAYS).  Nephro following for dialysis.         Diet Orders (From admission, onward)     Start     Ordered   10/07/22 1527  DIET DYS 3 Room service appropriate? Yes; Fluid consistency: Thin; Fluid restriction: 1200 mL Fluid  Diet effective now       Question Answer Comment  Room service appropriate? Yes   Fluid consistency: Thin   Fluid restriction: 1200 mL Fluid      10/07/22 1527            Subjective:  Seen at bedside.  Tells me he has orthopnea and exertional dyspnea. Confirms he does not produce any urine  Examination:  Constitutional: Not in acute distress Respiratory: Bilateral diminished breath sounds Cardiovascular: Normal sinus rhythm, no  rubs Abdomen: Nontender nondistended good bowel sounds Musculoskeletal: No edema noted Skin: No rashes seen Neurologic: CN 2-12 grossly intact.  And nonfocal Psychiatric: Normal judgment and insight. Alert and oriented x 3. Normal mood.  Objective: Vitals:   10/09/22 0600 10/09/22 0630 10/09/22 0819 10/09/22 1048  BP:  117/74  126/76   Pulse:  (!) 108  (!) 106  Resp:  (!) 26  (!) 30  Temp:    98.6 F (37 C)  TempSrc:    Axillary  SpO2:  100% 96% 95%  Weight: 82.6 kg     Height:        Intake/Output Summary (Last 24 hours) at 10/09/2022 1143 Last data filed at 10/09/2022 0823 Gross per 24 hour  Intake 780 ml  Output --  Net 780 ml   Filed Weights   10/07/22 1822 10/08/22 0600 10/09/22 0600  Weight: 80.8 kg 82.9 kg 82.6 kg    Scheduled Meds:  (feeding supplement) PROSource Plus  30 mL Oral TID BM   arformoterol  15 mcg Nebulization BID   benzonatate  100 mg Oral BID   calcitRIOL  0.5 mcg Oral Q T,Th,Sat-1800   Chlorhexidine Gluconate Cloth  6 each Topical Q12H   Chlorhexidine Gluconate Cloth  6 each Topical Q0600   cinacalcet  30 mg Oral Q supper   darbepoetin (ARANESP) injection - DIALYSIS  150 mcg Subcutaneous Q Thu-1800   feeding supplement (NEPRO CARB STEADY)  237 mL Oral TID BM   fluticasone  1 spray Each Nare Daily   guaiFENesin  600 mg Oral BID   loratadine  10 mg Oral Daily   methocarbamol  250 mg Oral BID   midodrine  10 mg Oral TID WC   multivitamin  1 tablet Oral QHS   pantoprazole  40 mg Oral BID AC   revefenacin  175 mcg Nebulization Daily   sucroferric oxyhydroxide  1,000 mg Oral TID WC   traZODone  150 mg Oral QHS   Continuous Infusions:  [START ON 10/14/2022]  ceFAZolin (ANCEF) IV     piperacillin-tazobactam (ZOSYN)  IV Stopped (10/09/22 0956)    Nutritional status Signs/Symptoms: meal completion < 25%, per patient/family report Interventions: Nepro shake, MVI, Prostat, Liberalize Diet Body mass index is 25.4 kg/m.  Data Reviewed:   CBC: Recent Labs  Lab 10/02/22 1234 10/05/22 2023 10/06/22 0637 10/07/22 0548 10/08/22 0605  WBC 13.3* 14.7* 13.8* 12.9* 13.7*  NEUTROABS 11.0* 12.3* 11.1* 10.3*  --   HGB 8.5* 7.0* 6.3* 6.1* 7.0*  HCT 26.8* 22.8* 21.3* 20.0* 22.6*  MCV 84.0 83.2 83.5 82.6 81.3  PLT 352 344 321 358 349   Basic Metabolic Panel: Recent Labs  Lab  10/02/22 1234 10/03/22 0629 10/04/22 0553 10/05/22 0531 10/05/22 2023 10/06/22 0637 10/08/22 0605  NA 128* 128* 128* 130* 129* 129* 127*  K 5.3* 4.0 4.1 3.6 3.8 3.8 3.7  CL 85* 92* 93* 94* 89* 92* 92*  CO2 24 23 24 26 25 24 25   GLUCOSE 109* 87 107* 106* 121* 100* 108*  BUN 61* 27* 39* 27* 36* 41* 30*  CREATININE 9.09* 5.67* 7.44* 5.79* 6.93* 7.89* 6.04*  CALCIUM 8.9 7.8* 8.1* 8.2* 8.5* 8.2* 7.8*  PHOS 4.5 3.8 3.8 3.9  --  4.1  --    GFR: Estimated Creatinine Clearance: 13.7 mL/min (A) (by C-G formula based on SCr of 6.04 mg/dL (H)). Liver Function Tests: Recent Labs  Lab 10/02/22 1234 10/03/22 0629 10/04/22 0553 10/05/22 0531 10/06/22 0637  ALBUMIN 2.1* 1.6*  1.7* 1.8* 1.6*   No results for input(s): "LIPASE", "AMYLASE" in the last 168 hours. No results for input(s): "AMMONIA" in the last 168 hours. Coagulation Profile: No results for input(s): "INR", "PROTIME" in the last 168 hours. Cardiac Enzymes: No results for input(s): "CKTOTAL", "CKMB", "CKMBINDEX", "TROPONINI" in the last 168 hours. BNP (last 3 results) No results for input(s): "PROBNP" in the last 8760 hours. HbA1C: No results for input(s): "HGBA1C" in the last 72 hours. CBG: No results for input(s): "GLUCAP" in the last 168 hours. Lipid Profile: No results for input(s): "CHOL", "HDL", "LDLCALC", "TRIG", "CHOLHDL", "LDLDIRECT" in the last 72 hours. Thyroid Function Tests: No results for input(s): "TSH", "T4TOTAL", "FREET4", "T3FREE", "THYROIDAB" in the last 72 hours. Anemia Panel: No results for input(s): "VITAMINB12", "FOLATE", "FERRITIN", "TIBC", "IRON", "RETICCTPCT" in the last 72 hours. Sepsis Labs: Recent Labs  Lab 10/02/22 1234 10/05/22 2023 10/05/22 2257  PROCALCITON 5.26 5.04  --   LATICACIDVEN  --  1.3 1.0    Recent Results (from the past 240 hour(s))  Culture, blood (Routine X 2) w Reflex to ID Panel     Status: None (Preliminary result)   Collection Time: 10/05/22  8:23 PM   Specimen:  BLOOD  Result Value Ref Range Status   Specimen Description BLOOD BLOOD RIGHT HAND  Final   Special Requests   Final    BOTTLES DRAWN AEROBIC AND ANAEROBIC Blood Culture adequate volume   Culture   Final    NO GROWTH 3 DAYS Performed at [facility], [address], [place], [place] [address]    Report Status PENDING  Incomplete  Culture, blood (Routine X 2) w Reflex to ID Panel     Status: None (Preliminary result)   Collection Time: 10/05/22  8:32 PM   Specimen: BLOOD  Result Value Ref Range Status   Specimen Description BLOOD BLOOD RIGHT ARM  Final   Special Requests   Final    BOTTLES DRAWN AEROBIC AND ANAEROBIC Blood Culture adequate volume   Culture   Final    NO GROWTH 3 DAYS Performed at [facility] Lab, [address], [place], [place] [address]    Report Status PENDING  Incomplete  Respiratory (~20 pathogens) panel by PCR     Status: None   Collection Time: 10/06/22  1:58 AM   Specimen: Nasopharyngeal Swab; Respiratory  Result Value Ref Range Status   Adenovirus NOT DETECTED NOT DETECTED Final   Coronavirus 229E NOT DETECTED NOT DETECTED Final    Comment: (NOTE) The Coronavirus on the Respiratory Panel, DOES NOT test for the novel  Coronavirus (2019 nCoV)    Coronavirus HKU1 NOT DETECTED NOT DETECTED Final   Coronavirus NL63 NOT DETECTED NOT DETECTED Final   Coronavirus OC43 NOT DETECTED NOT DETECTED Final   Metapneumovirus NOT DETECTED NOT DETECTED Final   Rhinovirus / Enterovirus NOT DETECTED NOT DETECTED Final   Influenza A NOT DETECTED NOT DETECTED Final   Influenza B NOT DETECTED NOT DETECTED Final   Parainfluenza Virus 1 NOT DETECTED NOT DETECTED Final   Parainfluenza Virus 2 NOT DETECTED NOT DETECTED Final   Parainfluenza Virus 3 NOT DETECTED NOT DETECTED Final   Parainfluenza Virus 4 NOT DETECTED NOT DETECTED Final   Respiratory Syncytial Virus NOT DETECTED NOT DETECTED Final   Bordetella pertussis NOT DETECTED NOT DETECTED Final   Bordetella  Parapertussis NOT DETECTED NOT DETECTED Final   Chlamydophila pneumoniae NOT DETECTED NOT DETECTED Final   Mycoplasma pneumoniae NOT DETECTED NOT DETECTED Final  Comment: Performed at [facility], [address] [place], [place] [address]  SARS Coronavirus 2 by RT PCR (hospital order, performed in [facility] hospital lab) *cepheid single result test* Anterior Nasal Swab     Status: None   Collection Time: 10/06/22  1:58 AM   Specimen: Anterior Nasal Swab  Result Value Ref Range Status   SARS Coronavirus 2 by RT PCR NEGATIVE NEGATIVE Final    Comment: Performed at [facility], [address], [place], [place] [address]         Radiology Studies: DG Swallowing Func-Speech Pathology  Result Date: 10/09/2022 Table formatting from the original result was not included. Modified Barium Swallow Study Patient Details Name: Ryan [name] MRN: [id number] Date of Birth: [birth year] Today's Date: 10/09/2022 HPI/PMH: HPI: Pt is a 62 y/o male admitted secondary to acute back pain and found to have L5-S1 discitis/osteomyelitis, SBO and MSSA bacteremia likely 2/2 line infection. Pt had HD cath replaced with rt femoral temp cath. 09/22/22 Hemodialysis Catheter Left Subclavian Double lumen Permanent (Tunneled). 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  PMH including but not limited to hypertension, hyperlipidemia, ESRD. Clinical Impression: Clinical Impression: Overall, pt presented w/ a safe and efficient oropharyngeal swallow. With large sips, oral residue w/ subsequent freespill to BOT and valleculae was noted. However, pt demonstrated excellent sensation of this residue and was consistently able to trigger the additional swallow as needed. Otherwise, adequate pharyngeal clearance noted and no airway invasion was evident throughout trials. Cough x2 noted overall, though unrelated to airway invasion. Would recommend continued diet of  regular textures and thin liquids, however, pt requested Dys 3 textures. Continue with meds whole via liquids. No dypshagia intervention required, conitnue w/ current POC for cognitive tx. Factors that may increase risk of adverse event in presence of aspiration [name] & [name] 2021): Factors that may increase risk of adverse event in presence of aspiration [name] & [name] 2021): Poor general health and/or compromised immunity; Respiratory or GI disease Recommendations/Plan: Swallowing Evaluation Recommendations Swallowing Evaluation Recommendations Recommendations: PO diet PO Diet Recommendation: Regular; Thin liquids (Level 0) Liquid Administration via: Straw; Cup Medication Administration: Whole meds with liquid Supervision: Patient able to self-feed Swallowing strategies  : Minimize environmental distractions; Small bites/sips; Slow rate Postural changes: Stay upright 30-60 min after meals; Position pt fully upright for meals Oral care recommendations: Oral care BID (2x/day); Pt independent with oral care Treatment Plan Treatment Plan Treatment recommendations: Therapy as outlined in treatment plan below Recommendations Comment: no dysphagia intervention required, conitnue w/ current POC for cognition Treatment frequency: Min 1x/week Treatment duration: 1 week Interventions: Other (comment) (no dysphagia intervention - continue w/ cog tx only) Recommendations Recommendations for follow up therapy are one component of a multi-disciplinary discharge planning process, led by the attending physician.  Recommendations may be updated based on patient status, additional functional criteria and insurance authorization. Assessment: Orofacial Exam: Orofacial Exam Oral Cavity: Oral Hygiene: WFL Oral Cavity - Dentition: Adequate natural dentition Orofacial Anatomy: WFL Oral Motor/Sensory Function: WFL Anatomy: Anatomy: WFL Boluses Administered: Boluses Administered Boluses Administered: Thin liquids (Level 0); Mildly  thick liquids (Level 2, nectar thick); Puree; Solid  Oral Impairment Domain: Oral Impairment Domain Lip Closure: No labial escape Tongue control during bolus hold: Cohesive bolus between tongue to palatal seal Bolus preparation/mastication: Timely and efficient chewing and mashing Bolus transport/lingual motion: Brisk tongue motion Oral residue: Trace residue lining oral structures Location of oral  residue : Tongue Initiation of pharyngeal swallow : Pyriform sinuses  Pharyngeal Impairment Domain: Pharyngeal Impairment Domain Soft palate elevation: No bolus between soft palate (SP)/pharyngeal wall (PW) Laryngeal elevation: Complete superior movement of thyroid cartilage with complete approximation of arytenoids to epiglottic petiole Anterior hyoid excursion: Complete anterior movement Epiglottic movement: Complete inversion Laryngeal vestibule closure: Complete, no air/contrast in laryngeal vestibule Pharyngeal stripping wave : Present - complete Pharyngeal contraction (A/P view only): N/A Pharyngoesophageal segment opening: Complete distension and complete duration, no obstruction of flow Tongue base retraction: No contrast between tongue base and posterior pharyngeal wall (PPW) Pharyngeal residue: Trace residue within or on pharyngeal structures Location of pharyngeal residue: Valleculae; Tongue base  Esophageal Impairment Domain: Esophageal Impairment Domain Esophageal clearance upright position: Esophageal retention Pill: No data recorded Penetration/Aspiration Scale Score: Penetration/Aspiration Scale Score 1.  Material does not enter airway: Thin liquids (Level 0); Mildly thick liquids (Level 2, nectar thick); Puree; Solid Compensatory Strategies: Compensatory Strategies Compensatory strategies: Yes Straw: Effective Effective Straw: Thin liquid (Level 0); Mildly thick liquid (Level 2, nectar thick) Multiple swallows: Effective Effective Multiple Swallows: Thin liquid (Level 0); Mildly thick liquid (Level 2,  nectar thick)   General Information: Caregiver present: No  Diet Prior to this Study: Regular; Thin liquids (Level 0)   Temperature : Normal   Respiratory Status: WFL   Supplemental O2: Nasal cannula (3L O2)   History of Recent Intubation: No  Behavior/Cognition: Alert; Cooperative; Pleasant mood Self-Feeding Abilities: Able to self-feed Baseline vocal quality/speech: Normal Volitional Cough: Able to elicit Volitional Swallow: Able to elicit Exam Limitations: No limitations Goal Planning: No data recorded No data recorded No data recorded No data recorded Consulted and agree with results and recommendations: Patient; Nurse; Physician Pain: Pain Assessment Pain Assessment: No/denies pain Pain Score: 0 End of Session: Start Time:No data recorded Stop Time: No data recorded Time Calculation:No data recorded Charges: No data recorded SLP visit diagnosis: No data recorded Past Medical History: Past Medical History: Diagnosis Date  Allergy   Anemia   Blood transfusion without reported diagnosis   Dialysis patient (HCC)   Tues,thurs,sat  ESRD (end stage renal disease) (HCC)   TTHS-   Family history of adverse reaction to anesthesia   father had allergic reaction with anesthesia with a dental procedure-(pt. doesn't know)  Hyperlipidemia   Hypertension  Past Surgical History: Past Surgical History: Procedure Laterality Date  A/V FISTULAGRAM Left 12/15/2016  Procedure: A/V Fistulagram - left;  Surgeon: Chuck Hint, MD;  Location: [facility] INVASIVE CV LAB;  Service: Cardiovascular;  Laterality: Left;  AV FISTULA PLACEMENT Left 08/28/2016  Procedure: LEFT UPPER  ARM ARTERIOVENOUS (AV) FISTULA CREATION;  Surgeon: Chuck Hint, MD;  Location: [facility] OR;  Service: Vascular;  Laterality: Left;  DIALYSIS/PERMA CATHETER INSERTION Right 12/21/2017  Procedure: INSERTION OF DIALYSIS CATHETER Right Internal Jugular .;  Surgeon: Sherren Kerns, MD;  Location: [facility] OR;  Service: Vascular;  Laterality: Right;  FISTULA  SUPERFICIALIZATION Left 09/23/2017  Procedure: FISTULA PLICATION LEFT ARM;  Surgeon: Sherren Kerns, MD;  Location: [facility] OR;  Service: Vascular;  Laterality: Left;  FISTULOGRAM Left 12/21/2017  Procedure: FISTULOGRAM with Balloon Angioplasty.;  Surgeon: Sherren Kerns, MD;  Location: [facility] OR;  Service: Vascular;  Laterality: Left;  HEMATOMA EVACUATION Left 08/27/2020  Procedure: EVACUATION HEMATOMA LEFT ARM;  Surgeon: Sherren Kerns, MD;  Location: [facility] OR;  Service: Vascular;  Laterality: Left;  IR FLUORO GUIDE CV LINE LEFT  09/22/2022  IR FLUORO GUIDE CV LINE RIGHT  09/12/2022  IR FLUORO GUIDE CV LINE RIGHT  09/17/2022  IR REMOVAL TUN CV CATH W/O FL  09/09/2022  IR THORACENTESIS ASP PLEURAL SPACE W/IMG GUIDE  08/22/2017  1.2 L -right-sided  IR THORACENTESIS ASP PLEURAL SPACE W/IMG GUIDE  10/19/2017  IR US GUIDE VASC ACCESS LEFT  09/22/2022  IR US GUIDE VASC ACCESS RIGHT  09/12/2022  KIDNEY TRANSPLANT    REVISON OF ARTERIOVENOUS FISTULA Left 12/21/2017  Procedure: PLICATION and Ligation of F LEFT ARM ARTERIOVENOUS FISTULA;  Surgeon: Sherren Kerns, MD;  Location: [facility] OR;  Service: Vascular;  Laterality: Left;  REVISON OF ARTERIOVENOUS FISTULA Left 07/16/2020  Procedure: EXCISION OF LEFT ARM ARTERIOVENOUS FISTULA;  Surgeon: Sherren Kerns, MD;  Location: [facility] OR;  Service: Vascular;  Laterality: Left;  stent in kidneys    dec 2017  TRANSTHORACIC ECHOCARDIOGRAM  09/22/2017   Severe LVH.  Normal function -EF 55-60%.  GRII DD.  Moderate RV dilation with mildly reduced RV function.   Fixed right coronary cusp with very mild aortic stenosis.  The myocardium has a speckled appearance --> .   Recommend cardiac MRI to evaluate for amyloid  UPPER EXTREMITY VENOGRAPHY Bilateral 06/01/2020  Procedure: UPPER EXTREMITY VENOGRAPHY;  Surgeon: Chuck Hint, MD;  Location: [facility] INVASIVE CV LAB;  Service: Cardiovascular;  Laterality: Bilateral; Pati Gallo 10/09/2022, 10:11 AM  DG CHEST PORT 1 VIEW  Result Date:  10/09/2022 CLINICAL DATA:  Recent pneumonia, low O2 sats with shortness of breath. EXAM: PORTABLE CHEST 1 VIEW COMPARISON:  Chest radiograph 10/05/2022 FINDINGS: The left-sided vascular catheter is stable with tip terminating in the right atrium. The heart is enlarged, unchanged. The upper mediastinal contours are prominent, also unchanged Lung volumes are low. There is worsened opacity in the right midlung. There is probable mild bibasilar subsegmental atelectasis. There is vascular congestion and probable mild pulmonary interstitial edema, slightly worsened. There is no significant pleural effusion there is no pneumothorax. There is no acute osseous abnormality. IMPRESSION: Cardiomegaly with vascular congestion and probable mild pulmonary interstitial edema, slightly worsened. More focal airspace opacity in the right midlung could reflect superimposed infection in the correct clinical setting. Electronically Signed   By: Lesia Hausen M.D.   On: 10/09/2022 08:44           LOS: 14 days   Time spent= 35 mins    Hadar Elgersma Joline Maxcy, MD [facility]  If 7PM-7AM, please contact night-coverage  10/09/2022, 11:43 AM

## 2022-10-09 NOTE — Progress Notes (Signed)
Occupational Therapy Session Note  Patient Details  Name: JANDEL PATRIARCA MRN: [id number] Date of Birth: 02-12-1961  Today's Date: 10/09/2022 OT Individual Time: 2703-5009 OT Individual Time Calculation (min): 57 min    Short Term Goals: Week 2:  OT Short Term Goal 1 (Week 2): STG=LTG d/t ELOS  Skilled Therapeutic Interventions/Progress Updates:   Pt seen for skilled OT session this am. Pt with MBS scheduled at 9 am and pt reporting not sleeping well and having SOB. MD arrived and assessed for need for xray and increased O2 to 3 ltrs. Pt sats on 1.5 ltrs 88-91% with improvement during light sink side self care seated to 96%. OT issued light foam resistance cube for grasp and cdp mngt for 10 reps Bly 2-3 x per day. OT assisted via 3 in 1 commode with CGA with RW for BM with max A for hygiene due to fatigue. Stood with CGA for pants mngt down and up. Transfer to bed via SPT with min a for sit to supine and bed positioning. Left pt with HOB elevated, O2 via [place], needs, nurse call button and bed exit engaged. Transport arrived for [facility] with handoff.   Pain: continuous ache in back 5/10 with relief with repositioning and meds given this am   Therapy Documentation Precautions:  Precautions Precautions: Fall Restrictions Weight Bearing Restrictions: No  Therapy/Group: Individual Therapy  Vicenta Dunning 10/09/2022, 7:41 AM

## 2022-10-09 NOTE — Progress Notes (Signed)
Inpatient Rehabilitation Discharge Medication Review by a Pharmacist  A complete drug regimen review was completed for this patient to identify any potential clinically significant medication issues.  High Risk Drug Classes Is patient taking? Indication by Medication  Antipsychotic No   Anticoagulant No   Antibiotic Yes Zosyn for pneumonia then back to Cefazolin - Pneumonia/MSSA bacteremia until 8/13  Opioid Yes Oxycodone - prn pain  Antiplatelet No   Hypoglycemics/insulin No   Vasoactive Medication Yes Midodrine - low BP  Chemotherapy No   Other Yes Yupleri, Duo-neb, Brovana- COPD Methocarbamol - spasms Aranesp - anemia     Type of Medication Issue Identified Description of Issue Recommendation(s)  Drug Interaction(s) (clinically significant)     Duplicate Therapy     Allergy     No Medication Administration End Date     Incorrect Dose     Additional Drug Therapy Needed     Significant med changes from prior encounter (inform family/care partners about these prior to discharge).    Other       Clinically significant medication issues were identified that warrant physician communication and completion of prescribed/recommended actions by midnight of the next day:  No  Pharmacist comments: Back to acute side for Afib with RVR / low HgB  Time spent performing this drug regimen review (minutes): 20 minutes  Okey Regal, PharmD

## 2022-10-09 NOTE — Progress Notes (Signed)
[facility] Progress Note   Subjective:   Patient seen and examined at bedside.  More SOB today. CXR reviewed and looks like pulm edema. Did go for a barium swallow. Plan for increased UF goals today and extra treatment tomorrow (patient agrees)  Objective Vitals:   10/09/22 0545 10/09/22 0600 10/09/22 0630 10/09/22 0819  BP: 122/71  117/74   Pulse: (!) 110  (!) 108   Resp:   (!) 26   Temp:      TempSrc:      SpO2: 96%  100% 96%  Weight:  82.6 kg    Height:       Physical Exam General:chronically ill appearing male in NAD Heart:RRR, no mrg Lungs: bibasilar crackles Abdomen:soft, NTND Extremities:no LE edema Dialysis Access: [facility]   Filed Weights   10/07/22 1822 10/08/22 0600 10/09/22 0600  Weight: 80.8 kg 82.9 kg 82.6 kg    Intake/Output Summary (Last 24 hours) at 10/09/2022 1018 Last data filed at 10/09/2022 0823 Gross per 24 hour  Intake 780 ml  Output --  Net 780 ml    Additional Objective Labs: Basic Metabolic Panel: Recent Labs  Lab 10/04/22 0553 10/05/22 0531 10/05/22 2023 10/06/22 0637 10/08/22 0605  NA 128* 130* 129* 129* 127*  K 4.1 3.6 3.8 3.8 3.7  CL 93* 94* 89* 92* 92*  CO2 24 26 25 24 25   GLUCOSE 107* 106* 121* 100* 108*  BUN 39* 27* 36* 41* 30*  CREATININE 7.44* 5.79* 6.93* 7.89* 6.04*  CALCIUM 8.1* 8.2* 8.5* 8.2* 7.8*  PHOS 3.8 3.9  --  4.1  --    Liver Function Tests: Recent Labs  Lab 10/04/22 0553 10/05/22 0531 10/06/22 0637  ALBUMIN 1.7* 1.8* 1.6*    CBC: Recent Labs  Lab 10/02/22 1234 10/05/22 2023 10/06/22 0637 10/07/22 0548 10/08/22 0605  WBC 13.3* 14.7* 13.8* 12.9* 13.7*  NEUTROABS 11.0* 12.3* 11.1* 10.3*  --   HGB 8.5* 7.0* 6.3* 6.1* 7.0*  HCT 26.8* 22.8* 21.3* 20.0* 22.6*  MCV 84.0 83.2 83.5 82.6 81.3  PLT 352 344 321 358 349   Blood Culture    Component Value Date/Time   SDES BLOOD BLOOD RIGHT ARM 10/05/2022 2032   SPECREQUEST  10/05/2022 2032    BOTTLES DRAWN AEROBIC AND ANAEROBIC Blood  Culture adequate volume   CULT  10/05/2022 2032    NO GROWTH 3 DAYS Performed at [facility], [address], [place], [place] [address]    REPTSTATUS PENDING 10/05/2022 2032    Lab Results  Component Value Date   INR 1.4 (H) 09/10/2022   INR 1.13 09/23/2017   INR 1.01 08/28/2016    Medications:  [START ON 10/14/2022]  ceFAZolin (ANCEF) IV     piperacillin-tazobactam (ZOSYN)  IV Stopped (10/09/22 0956)    (feeding supplement) PROSource Plus  30 mL Oral TID BM   arformoterol  15 mcg Nebulization BID   benzonatate  100 mg Oral BID   calcitRIOL  0.5 mcg Oral Q T,Th,Sat-1800   Chlorhexidine Gluconate Cloth  6 each Topical Q12H   Chlorhexidine Gluconate Cloth  6 each Topical Q0600   cinacalcet  30 mg Oral Q supper   darbepoetin (ARANESP) injection - DIALYSIS  150 mcg Subcutaneous Q Thu-1800   feeding supplement (NEPRO CARB STEADY)  237 mL Oral TID BM   fluticasone  1 spray Each Nare Daily   guaiFENesin  600 mg Oral BID   loratadine  10 mg Oral Daily   methocarbamol  250  mg Oral BID   midodrine  10 mg Oral TID WC   multivitamin  1 tablet Oral QHS   pantoprazole  40 mg Oral BID AC   revefenacin  175 mcg Nebulization Daily   sucroferric oxyhydroxide  1,000 mg Oral TID WC   traZODone  150 mg Oral QHS    Dialysis Orders: [facility] T[facility]  3:45 450/A1.5x 2K/2Ca EDW 85.7kg TDC  -Heparin 3000 units IV TIW  -No ESA -Calcitriol 1.5 MCG PO three times per week   Assessment/Plan: 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 ID RX  Blood Cx 6/16 MSSA, negative Cx on 6/17, 6/19. TDC removed 6/18. Completed 72 hour line holiday. TTE concerning for endocarditis. TEE cancelled d/t high risk factors. LS MRI with possible L5-S1 discitis. Temp cath placed 6/21, but poor function requiring exchange -  Will be getting IV Cefazolin 2g q HD x 6 weeks (until 11/04/22). SOB: likely volume mediated upon personally reviewing his CXR today. Will UF as tolerated today and  extra treatment tomorrow Deconditioning /weakness: in CIR GIB - Hgb dropping, +FOBT, GI consulting. Hold heparin with HD. Per primary service Dialysis Access: [facility] TDC removed 6/18 - s/p line holiday, then temp line placed 6/21 with recurrent non-function issues despite exchange 6/26. S/p [facility] 7/1 in IR.  SBO - on CT Ab 6/19. Now resolved, surgery team has signed off. Hyperkalemia: resolved Hyponatremia: managing with HD and UF ESRD: Usual TTS schedule - then line issues preventing HD 6/27 -7/1. HD today, tomorrow, Sat as above [place] Access: S/p L AVF ligation in 2019. Last seen by Dr. Edilia Bo 01/2022 - limited mobility in R arm so planning for L arm graft but had not scheduled surgery yet. Can revisit as an outpatient once bacteremia resolved.  Hypotension/volume: on midodrine. UF as tolerated.  SOB improved today. Getting under edw, will need lower EDW on discharge.  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  +FOBT. continue Aranesp q Thursday, increased next dose to .   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 Binder changed to Velphoro now at goal. Increased dose to 2 tabs PO TID AC.   Nutrition - Renal diet, Alb low - continue supplements  Discussed with primary service.  Anthony Sar, MD [facility]

## 2022-10-09 NOTE — Progress Notes (Signed)
Physical Therapy Discharge Note  This patient was unable to complete the inpatient rehab program due to medical complications; therefore did not meet their long term goals. Pt left the program at a CGA assist level for their functional mobility/ transfers. This patient is being discharged from PT services at this time.  Pt's perception of pain in the last five days was unable to answer at this time.    See CareTool for functional status details  If the patient is able to return to inpatient rehabilitation within 3 midnights, this may be considered an interrupted stay and therapy services will resume as ordered. Modification and reinstatement of their goals will be made upon completion of therapy service reevaluations.

## 2022-10-09 NOTE — Consult Note (Signed)
Cardiology Consultation   Patient ID: Ryan [name] MRN: [id number]; DOB: 1960-09-18  Admit date: 10/09/2022 Date of Consult: 10/09/2022  PCP:  Sharin Grave, MD   [facility] Providers Cardiologist:  Bryan Lemma, MD   {    Patient Profile:   Ryan [name] is a 62 y.o. 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  History of Present Illness:   Ryan [name] with above PMH had a prolonged hospitalization 08/2022 here for MSSA bacteremia, L5/S1 discitis and osteomyelitis, mitral valve endocarditis, septi emboli to lung and brain, and SBO. He was managed conservatively with antibiotic and suppose to continue cefazolin until 8/13 per ID. Patient was discharged to inpatient rehab 10/05/22. RRT was called today at rehab, patient was complaining chest discomfort and lightheadedness, EKG concerning for A Fib RVR, now discharged from rehab to inpatient unit for further management. Medicine consult also was requested for cough and suspected aspiration. He was placed on empiric Zosyn and felt volume overload on exam.  Prior to above hospitalization, he has no cardiac disease. Echo from 2019 with LVEF 55-60%, grade II DD, mild AS, mild dilation of aortic root and ascending aorta, mod reduced RV and mod RV dilation. Myocardial amyloid imaging 10/28/17 showed no evidence of transthyretin amyloidosis. Echo last done 09/08/22 showed LVEF 55-60%, no RWMA, mod LVH, grade I DD, normal RV, mild LAE, bubble study was not done. Mild AI and AS VTI 1.9 cm2 and mean gradient 10 mmHg. Mild dilatation of ascending aorta 39 mm. There is a large, rounded structure on the anterior leflet of the  mitral valve tht is partially obstructing mitral inflow with a  ball-valve type motion. This was not present on echo in 5/24. Concern for endocarditis. TEE was cancelled 09/11/22 due to risk of cardiac anesthesia outweighs the benefit due to his acute illness.    Past Medical History:  Diagnosis Date   Allergy    Anemia    Blood transfusion without reported diagnosis    Dialysis patient (HCC)    Tues,thurs,sat   ESRD (end stage renal disease) (HCC)    TTHS-    Family history of adverse reaction to anesthesia    father had allergic reaction with anesthesia with a dental procedure-(pt. doesn't know)   Hyperlipidemia    Hypertension     Past Surgical History:  Procedure Laterality Date   A/V FISTULAGRAM Left 12/15/2016   Procedure: A/V Fistulagram - left;  Surgeon: Chuck Hint, MD;  Location: [facility] INVASIVE CV LAB;  Service: Cardiovascular;  Laterality: Left;   AV FISTULA PLACEMENT Left 08/28/2016   Procedure: LEFT UPPER  ARM ARTERIOVENOUS (AV) FISTULA CREATION;  Surgeon: Chuck Hint, MD;  Location: [facility] OR;  Service: Vascular;  Laterality: Left;   DIALYSIS/PERMA CATHETER INSERTION Right 12/21/2017   Procedure: INSERTION OF DIALYSIS CATHETER Right Internal Jugular .;  Surgeon: Sherren Kerns, MD;  Location: [facility] OR;  Service: Vascular;  Laterality: Right;   FISTULA SUPERFICIALIZATION Left 09/23/2017   Procedure: FISTULA PLICATION LEFT ARM;  Surgeon: Sherren Kerns, MD;  Location: [facility] OR;  Service: Vascular;  Laterality: Left;   FISTULOGRAM Left 12/21/2017   Procedure: FISTULOGRAM with Balloon Angioplasty.;  Surgeon: Sherren Kerns, MD;  Location: [facility] OR;  Service: Vascular;  Laterality: Left;   HEMATOMA EVACUATION Left 08/27/2020   Procedure: EVACUATION HEMATOMA LEFT ARM;  Surgeon: Sherren Kerns, MD;  Location: [facility] OR;  Service: Vascular;  Laterality: Left;   IR FLUORO GUIDE CV LINE LEFT  09/22/2022   IR FLUORO GUIDE CV LINE RIGHT  09/12/2022   IR FLUORO GUIDE CV LINE RIGHT  09/17/2022   IR REMOVAL TUN CV CATH W/O FL  09/09/2022    IR THORACENTESIS ASP PLEURAL SPACE W/IMG GUIDE  08/22/2017   1.2 L -right-sided   IR THORACENTESIS ASP PLEURAL SPACE W/IMG GUIDE  10/19/2017   IR US GUIDE VASC ACCESS LEFT  09/22/2022   IR US GUIDE VASC ACCESS RIGHT  09/12/2022   KIDNEY TRANSPLANT     REVISON OF ARTERIOVENOUS FISTULA Left 12/21/2017   Procedure: PLICATION and Ligation of F LEFT ARM ARTERIOVENOUS FISTULA;  Surgeon: Sherren Kerns, MD;  Location: [facility] OR;  Service: Vascular;  Laterality: Left;   REVISON OF ARTERIOVENOUS FISTULA Left 07/16/2020   Procedure: EXCISION OF LEFT ARM ARTERIOVENOUS FISTULA;  Surgeon: Sherren Kerns, MD;  Location: [facility] OR;  Service: Vascular;  Laterality: Left;   stent in kidneys     dec 2017   TRANSTHORACIC ECHOCARDIOGRAM  09/22/2017    Severe LVH.  Normal function -EF 55-60%.  GRII DD.  Moderate RV dilation with mildly reduced RV function.   Fixed right coronary cusp with very mild aortic stenosis.  The myocardium has a speckled appearance --> .   Recommend cardiac MRI to evaluate for amyloid   UPPER EXTREMITY VENOGRAPHY Bilateral 06/01/2020   Procedure: UPPER EXTREMITY VENOGRAPHY;  Surgeon: Chuck Hint, MD;  Location: [facility] INVASIVE CV LAB;  Service: Cardiovascular;  Laterality: Bilateral;     Home Medications:  Prior to Admission medications   Medication Sig Start Date End Date Taking? Authorizing Provider  acetaminophen (TYLENOL) 325 MG tablet Take 1-2 tablets (325-650 mg total) by mouth every 4 (four) hours as needed for mild pain. 10/09/22   Angiulli, Mcarthur Rossetti, PA-C  arformoterol (BROVANA) 15 MCG/2ML NEBU Take 2 mLs (15 mcg total) by nebulization 2 (two) times daily. 10/09/22   Angiulli, Mcarthur Rossetti, PA-C  benzonatate (TESSALON) 100 MG capsule Take 1 capsule (100 mg total) by mouth 2 (two) times daily. 10/09/22   Angiulli, Mcarthur Rossetti, PA-C  bisacodyl (DULCOLAX) 10 MG suppository Place 1 suppository (10 mg total) rectally daily as needed for moderate constipation. 10/09/22   Angiulli, Mcarthur Rossetti,  PA-C  calcitRIOL (ROCALTROL) 0.5 MCG capsule Take 1 capsule (0.5 mcg total) by mouth every Tuesday, Thursday, and Saturday at 6 PM. 09/25/22   Dorcas Carrow, MD  calcium carbonate (TUMS - DOSED IN MG ELEMENTAL CALCIUM) 500 MG chewable tablet Chew 1 tablet (200 mg of elemental calcium total) by mouth 4 (four) times daily as needed for indigestion or heartburn. 10/09/22   Angiulli, Mcarthur Rossetti, PA-C  camphor-menthol [facility]) lotion Apply topically as needed for itching. 10/09/22   Angiulli, Mcarthur Rossetti, PA-C  ceFAZolin (ANCEF) 2-4 GM/100ML-% IVPB Inject 100 mLs (2 g total) into the vein every Tuesday, Thursday, and Saturday at 6 PM. 10/14/22   Angiulli, Mcarthur Rossetti, PA-C  ceFAZolin (ANCEF) IVPB Inject 2 g into the vein Every Tuesday,Thursday,and Saturday with dialysis. Indication:  MSSA discitis/osteo First Dose: Yes Last Day of Therapy:  11/04/22 Labs - Once weekly:  CBC/D and BMP, ESR and CRP Method of administration: Per HD protocol Method of administration may be changed at the discretion of home infusion  pharmacist based upon assessment of the patient and/or caregiver's ability to self-administer the medication ordered. 09/16/22 11/04/22  Danelle Earthly, MD  cinacalcet (SENSIPAR) 30 MG tablet Take 30 mg by mouth at bedtime. Patient not taking: Reported on 09/08/2022 12/08/17   [provider]  Darbepoetin Alfa (ARANESP) 150 MCG/0.3ML SOSY injection Inject 0.3 mLs (150 mcg total) into the skin every Thursday at 6pm. 10/09/22   Angiulli, Mcarthur Rossetti, PA-C  guaiFENesin (MUCINEX) 600 MG 12 hr tablet Take 1 tablet (600 mg total) by mouth 2 (two) times daily. 10/09/22   Angiulli, Mcarthur Rossetti, PA-C  ipratropium-albuterol (DUONEB) 0.5-2.5 (3) MG/3ML SOLN Take 3 mLs by nebulization every 4 (four) hours as needed. 10/09/22   Angiulli, Mcarthur Rossetti, PA-C  methocarbamol (ROBAXIN) 500 MG tablet Take 0.5 tablets (250 mg total) by mouth 2 (two) times daily. 10/09/22   Angiulli, Mcarthur Rossetti, PA-C  midodrine (PROAMATINE) 10 MG tablet Take  1 tablet (10 mg total) by mouth 3 (three) times daily with meals. 10/09/22   Angiulli, Mcarthur Rossetti, PA-C  multivitamin (RENA-VIT) TABS tablet Take 1 tablet by mouth daily.    [provider]  oxyCODONE 7.5 MG TABS Take 7.5-10 mg by mouth every 4 (four) hours as needed for moderate pain or severe pain. 10/09/22   Angiulli, Mcarthur Rossetti, PA-C  pantoprazole (PROTONIX) 40 MG tablet Take 1 tablet (40 mg total) by mouth daily. 09/25/22   Dorcas Carrow, MD  revefenacin (YUPELRI) 175 MCG/3ML nebulizer solution Take 3 mLs (175 mcg total) by nebulization daily. 10/10/22   Angiulli, Mcarthur Rossetti, PA-C  sucroferric oxyhydroxide (VELPHORO) 500 MG chewable tablet Chew 2 tablets (1,000 mg total) by mouth 3 (three) times daily with meals. 09/25/22   Dorcas Carrow, MD    Inpatient Medications: Scheduled Meds:  heparin  5,000 Units Subcutaneous Q8H   Continuous Infusions:  PRN Meds: acetaminophen **OR** acetaminophen, bisacodyl, guaiFENesin, hydrALAZINE, ipratropium-albuterol, metoprolol tartrate, ondansetron **OR** ondansetron (ZOFRAN) IV, ondansetron (ZOFRAN) IV, oxyCODONE, senna-docusate  Allergies:    Allergies  Allergen Reactions   Iodinated Contrast Media Itching and Nausea Only    Probably needs prednisone prep prior to contrast   Z-Pak [Azithromycin] Nausea And Vomiting and Other (See Comments)    Chest tightness GI Intolerance    Social History:   Social History   Socioeconomic History   Marital status: Divorced    Spouse name: Not on file   Number of children: Not on file   Years of education: Not on file   Highest education level: Not on file  Occupational History   Not on file  Tobacco Use   Smoking status: Never   Smokeless tobacco: Never  Vaping Use   Vaping status: Never Used  Substance and Sexual Activity   Alcohol use: No   Drug use: No   Sexual activity: Not Currently    Birth control/protection: None  Other Topics Concern   Not on file  Social History Narrative   Divorced    Does Mudlogger work   3 years of college.   Does not drink, does not smoke.  Does not use illicit drugs.   Son in nursing school at [employer]   Social Determinants of Health   Financial Resource Strain: Medium Risk (05/16/2022)   Received [facility], [facility]   Overall Financial Resource Strain (CARDIA)    Difficulty of Paying Living Expenses: Somewhat hard  Food Insecurity: No Food Insecurity (10/09/2022)   Hunger Vital Sign  Worried About Programme researcher, broadcasting/film/video in the Last Year: Never true    Ran Out of Food in the Last Year: Never true  Transportation Needs: No Transportation Needs (10/09/2022)   PRAPARE - Administrator, Civil Service (Medical): No    Lack of Transportation (Non-Medical): No  Physical Activity: Not on file  Stress: Not on file  Social Connections: Not on file  Intimate Partner Violence: Not At Risk (10/09/2022)   Humiliation, Afraid, Rape, and Kick questionnaire    Fear of Current or Ex-Partner: No    Emotionally Abused: No    Physically Abused: No    Sexually Abused: No    Family History:    Family History  Problem Relation Age of Onset   Hypertension Mother    Heart disease Mother 87       By his report, he thinks that she had heart attack.   Stroke Mother    Cancer Father    Kidney cancer Father    Hypertension Sister    Heart disease Maternal Grandmother    Alcohol abuse Maternal Grandfather    Mental illness Paternal Grandmother    Learning disabilities Paternal Grandmother        Alzheimer's    Stroke Paternal Grandfather    Colon cancer Neg Hx    Colon polyps Neg Hx    Esophageal cancer Neg Hx    Rectal cancer Neg Hx    Stomach cancer Neg Hx      ROS:  Please see the history of present illness.  All other ROS reviewed and negative.     Physical Exam/Data:   Vitals:   10/09/22 1608  BP: 103/72  Pulse: (!) 101  Resp: 20  Temp: 97.7 F (36.5 C)  TempSrc: Oral   Weight: 81 kg  Height: 5\' 9"  (1.753 m)   No intake or output data in the 24 hours ending 10/09/22 1615    10/09/2022    4:08 PM 10/09/2022    2:25 PM 10/09/2022   11:51 AM  Last 3 Weights  Weight (lbs) 178 lb 9.2 oz 178 lb 9.2 oz 182 lb 15.7 oz  Weight (kg) 81 kg 81 kg 83 kg     Body mass index is 26.37 kg/m.  General:  mild wob on [place] HEENT: normal Neck: no JVD Vascular: No carotid bruits; Distal pulses 2+ bilaterally Cardiac:  normal S1, S2; RRR; no murmur  Lungs:  no wheezing Abd: soft, nontender, no hepatomegaly  Ext: no edema Musculoskeletal:  No deformities, BUE and BLE strength normal and equal Skin: warm and dry  Neuro:  CNs 2-12 intact, no focal abnormalities noted Psych:  Normal affect   EKG:  The EKG was personally reviewed and demonstrates:    Sinus rhythm; 1st degree AV block with PACs  Telemetry:  Telemetry was personally reviewed and demonstrates:    Sinus rhyhm, 1st degree AV block   Relevant CV Studies:   1. Left ventricular ejection fraction, by estimation, is 55 to 60%. The  left ventricle has normal function. The left ventricle has no regional  wall motion abnormalities. There is moderate concentric left ventricular  hypertrophy. Left ventricular  diastolic parameters are consistent with Grade I diastolic dysfunction  (impaired relaxation).   2. Right ventricular systolic function is normal. The right ventricular  size is normal.   3. Left atrial size was mildly dilated.   4. Bubble study performed per tech's notes but I do not see any bubbles  appear on the study. Consider repeat study, if clinically indicated (can  do during TEE if needed).   5. There is a large, rounded structure on the anterior leflet of the  mitral valve tht is partially obstructing mitral inflow with a ball-valve  type motion. This was not present on echo in 5/24. Concern for  endocarditis. Suggest TEE to more formally  evalaute. The mitral valve is normal in structure.  No evidence of mitral  valve regurgitation. No evidence of mitral stenosis.   6. The right coronary cusp of the aortic valve appears fused. The aortic  valve is normal in structure. There is moderate calcification of the  aortic valve. Aortic valve regurgitation is mild. Mild aortic valve  stenosis. Aortic regurgitation PHT measures   526 msec. Aortic valve area, by VTI measures 1.90 cm. Aortic valve mean  gradient measures 10.0 mmHg. Aortic valve Vmax measures 2.17 m/s.   7. Aortic dilatation noted. There is mild dilatation of the ascending  aorta, measuring 39 mm.   8. The inferior vena cava is normal in size with greater than 50%  respiratory variability, suggesting right atrial pressure of 3 mmHg.   Conclusion(s)/Recommendation(s): There is a large, rounded structure on  the anterior leflet of the mitral valve tht is partially obstructing  mitral inflow with a ball-valve type motion. This was not present on echo  in 5/24. Concern for endocarditis.  Suggest TEE to more formally evalaute.    Laboratory Data:  High Sensitivity Troponin:  No results for input(s): "TROPONINIHS" in the last 720 hours.   Chemistry Recent Labs  Lab 10/06/22 0637 10/08/22 0605 10/09/22 1138  NA 129* 127* 124*  K 3.8 3.7 3.9  CL 92* 92* 88*  CO2 24 25 24   GLUCOSE 100* 108* 115*  BUN 41* 30* 40*  CREATININE 7.89* 6.04* 7.42*  CALCIUM 8.2* 7.8* 7.9*  GFRNONAA 7* 10* 8*  ANIONGAP 13 10 12     Recent Labs  Lab 10/05/22 0531 10/06/22 0637 10/09/22 1138  ALBUMIN 1.8* 1.6* 1.9*   Lipids No results for input(s): "CHOL", "TRIG", "HDL", "LABVLDL", "LDLCALC", "CHOLHDL" in the last 168 hours.  Hematology Recent Labs  Lab 10/07/22 0548 10/08/22 0605 10/09/22 1138  WBC 12.9* 13.7* 14.3*  RBC 2.42* 2.78* 3.03*  HGB 6.1* 7.0* 7.7*  HCT 20.0* 22.6* 24.8*  MCV 82.6 81.3 81.8  MCH 25.2* 25.2* 25.4*  MCHC 30.5 31.0 31.0  RDW 16.0* 16.1* 16.3*  PLT 358 349 377   Thyroid No results for input(s):  "TSH", "FREET4" in the last 168 hours.  BNPNo results for input(s): "BNP", "PROBNP" in the last 168 hours.  DDimer No results for input(s): "DDIMER" in the last 168 hours.   Radiology/Studies:  DG Swallowing Func-Speech Pathology  Result Date: 10/09/2022 Table formatting from the original result was not included. Modified Barium Swallow Study Patient Details Name: Ryan [name] MRN: [id number] Date of Birth: 05/29/1960 Today's Date: 10/09/2022 HPI/PMH: HPI: Pt is a 62 y/o male admitted secondary to acute back pain and found to have L5-S1 discitis/osteomyelitis, SBO and MSSA bacteremia likely 2/2 line infection. Pt had HD cath replaced with rt femoral temp cath. 09/22/22 Hemodialysis Catheter Left Subclavian Double lumen Permanent (Tunneled). 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  PMH including but not limited to hypertension, hyperlipidemia, ESRD. Clinical Impression: Clinical Impression: Overall, pt presented w/ a safe and efficient oropharyngeal swallow. With large sips, oral residue w/ subsequent freespill to BOT  and valleculae was noted. However, pt demonstrated excellent sensation of this residue and was consistently able to trigger the additional swallow as needed. Otherwise, adequate pharyngeal clearance noted and no airway invasion was evident throughout trials. Cough x2 noted overall, though unrelated to airway invasion. Would recommend continued diet of regular textures and thin liquids, however, pt requested Dys 3 textures. Continue with meds whole via liquids. No dypshagia intervention required, conitnue w/ current POC for cognitive tx. Factors that may increase risk of adverse event in presence of aspiration [name] & [name] 2021): Factors that may increase risk of adverse event in presence of aspiration [name] & [name] 2021): Poor general health and/or compromised immunity; Respiratory or GI disease Recommendations/Plan:  Swallowing Evaluation Recommendations Swallowing Evaluation Recommendations Recommendations: PO diet PO Diet Recommendation: Regular; Thin liquids (Level 0) Liquid Administration via: Straw; Cup Medication Administration: Whole meds with liquid Supervision: Patient able to self-feed Swallowing strategies  : Minimize environmental distractions; Small bites/sips; Slow rate Postural changes: Stay upright 30-60 min after meals; Position pt fully upright for meals Oral care recommendations: Oral care BID (2x/day); Pt independent with oral care Treatment Plan Treatment Plan Treatment recommendations: Therapy as outlined in treatment plan below Recommendations Comment: no dysphagia intervention required, conitnue w/ current POC for cognition Treatment frequency: Min 1x/week Treatment duration: 1 week Interventions: Other (comment) (no dysphagia intervention - continue w/ cog tx only) Recommendations Recommendations for follow up therapy are one component of a multi-disciplinary discharge planning process, led by the attending physician.  Recommendations may be updated based on patient status, additional functional criteria and insurance authorization. Assessment: Orofacial Exam: Orofacial Exam Oral Cavity: Oral Hygiene: WFL Oral Cavity - Dentition: Adequate natural dentition Orofacial Anatomy: WFL Oral Motor/Sensory Function: WFL Anatomy: Anatomy: WFL Boluses Administered: Boluses Administered Boluses Administered: Thin liquids (Level 0); Mildly thick liquids (Level 2, nectar thick); Puree; Solid  Oral Impairment Domain: Oral Impairment Domain Lip Closure: No labial escape Tongue control during bolus hold: Cohesive bolus between tongue to palatal seal Bolus preparation/mastication: Timely and efficient chewing and mashing Bolus transport/lingual motion: Brisk tongue motion Oral residue: Trace residue lining oral structures Location of oral residue : Tongue Initiation of pharyngeal swallow : Pyriform sinuses  Pharyngeal  Impairment Domain: Pharyngeal Impairment Domain Soft palate elevation: No bolus between soft palate (SP)/pharyngeal wall (PW) Laryngeal elevation: Complete superior movement of thyroid cartilage with complete approximation of arytenoids to epiglottic petiole Anterior hyoid excursion: Complete anterior movement Epiglottic movement: Complete inversion Laryngeal vestibule closure: Complete, no air/contrast in laryngeal vestibule Pharyngeal stripping wave : Present - complete Pharyngeal contraction (A/P view only): N/A Pharyngoesophageal segment opening: Complete distension and complete duration, no obstruction of flow Tongue base retraction: No contrast between tongue base and posterior pharyngeal wall (PPW) Pharyngeal residue: Trace residue within or on pharyngeal structures Location of pharyngeal residue: Valleculae; Tongue base  Esophageal Impairment Domain: Esophageal Impairment Domain Esophageal clearance upright position: Esophageal retention Pill: No data recorded Penetration/Aspiration Scale Score: Penetration/Aspiration Scale Score 1.  Material does not enter airway: Thin liquids (Level 0); Mildly thick liquids (Level 2, nectar thick); Puree; Solid Compensatory Strategies: Compensatory Strategies Compensatory strategies: Yes Straw: Effective Effective Straw: Thin liquid (Level 0); Mildly thick liquid (Level 2, nectar thick) Multiple swallows: Effective Effective Multiple Swallows: Thin liquid (Level 0); Mildly thick liquid (Level 2, nectar thick)   General Information: Caregiver present: No  Diet Prior to this Study: Regular; Thin liquids (Level 0)   Temperature : Normal   Respiratory Status: WFL   Supplemental O2: Nasal cannula (  3L O2)   History of Recent Intubation: No  Behavior/Cognition: Alert; Cooperative; Pleasant mood Self-Feeding Abilities: Able to self-feed Baseline vocal quality/speech: Normal Volitional Cough: Able to elicit Volitional Swallow: Able to elicit Exam Limitations: No limitations Goal  Planning: No data recorded No data recorded No data recorded No data recorded Consulted and agree with results and recommendations: Patient; Nurse; Physician Pain: Pain Assessment Pain Assessment: No/denies pain Pain Score: 0 End of Session: Start Time:No data recorded Stop Time: No data recorded Time Calculation:No data recorded Charges: No data recorded SLP visit diagnosis: No data recorded Past Medical History: Past Medical History: Diagnosis Date  Allergy   Anemia   Blood transfusion without reported diagnosis   Dialysis patient (HCC)   Tues,thurs,sat  ESRD (end stage renal disease) (HCC)   TTHS-   Family history of adverse reaction to anesthesia   father had allergic reaction with anesthesia with a dental procedure-(pt. doesn't know)  Hyperlipidemia   Hypertension  Past Surgical History: Past Surgical History: Procedure Laterality Date  A/V FISTULAGRAM Left 12/15/2016  Procedure: A/V Fistulagram - left;  Surgeon: Chuck Hint, MD;  Location: [facility] INVASIVE CV LAB;  Service: Cardiovascular;  Laterality: Left;  AV FISTULA PLACEMENT Left 08/28/2016  Procedure: LEFT UPPER  ARM ARTERIOVENOUS (AV) FISTULA CREATION;  Surgeon: Chuck Hint, MD;  Location: [facility] OR;  Service: Vascular;  Laterality: Left;  DIALYSIS/PERMA CATHETER INSERTION Right 12/21/2017  Procedure: INSERTION OF DIALYSIS CATHETER Right Internal Jugular .;  Surgeon: Sherren Kerns, MD;  Location: [facility] OR;  Service: Vascular;  Laterality: Right;  FISTULA SUPERFICIALIZATION Left 09/23/2017  Procedure: FISTULA PLICATION LEFT ARM;  Surgeon: Sherren Kerns, MD;  Location: [facility] OR;  Service: Vascular;  Laterality: Left;  FISTULOGRAM Left 12/21/2017  Procedure: FISTULOGRAM with Balloon Angioplasty.;  Surgeon: Sherren Kerns, MD;  Location: [facility] OR;  Service: Vascular;  Laterality: Left;  HEMATOMA EVACUATION Left 08/27/2020  Procedure: EVACUATION HEMATOMA LEFT ARM;  Surgeon: Sherren Kerns, MD;  Location: [facility] OR;  Service: Vascular;   Laterality: Left;  IR FLUORO GUIDE CV LINE LEFT  09/22/2022  IR FLUORO GUIDE CV LINE RIGHT  09/12/2022  IR FLUORO GUIDE CV LINE RIGHT  09/17/2022  IR REMOVAL TUN CV CATH W/O FL  09/09/2022  IR THORACENTESIS ASP PLEURAL SPACE W/IMG GUIDE  08/22/2017  1.2 L -right-sided  IR THORACENTESIS ASP PLEURAL SPACE W/IMG GUIDE  10/19/2017  IR US GUIDE VASC ACCESS LEFT  09/22/2022  IR US GUIDE VASC ACCESS RIGHT  09/12/2022  KIDNEY TRANSPLANT    REVISON OF ARTERIOVENOUS FISTULA Left 12/21/2017  Procedure: PLICATION and Ligation of F LEFT ARM ARTERIOVENOUS FISTULA;  Surgeon: Sherren Kerns, MD;  Location: [facility] OR;  Service: Vascular;  Laterality: Left;  REVISON OF ARTERIOVENOUS FISTULA Left 07/16/2020  Procedure: EXCISION OF LEFT ARM ARTERIOVENOUS FISTULA;  Surgeon: Sherren Kerns, MD;  Location: [facility] OR;  Service: Vascular;  Laterality: Left;  stent in kidneys    dec 2017  TRANSTHORACIC ECHOCARDIOGRAM  09/22/2017   Severe LVH.  Normal function -EF 55-60%.  GRII DD.  Moderate RV dilation with mildly reduced RV function.   Fixed right coronary cusp with very mild aortic stenosis.  The myocardium has a speckled appearance --> .   Recommend cardiac MRI to evaluate for amyloid  UPPER EXTREMITY VENOGRAPHY Bilateral 06/01/2020  Procedure: UPPER EXTREMITY VENOGRAPHY;  Surgeon: Chuck Hint, MD;  Location: [facility] INVASIVE CV LAB;  Service: Cardiovascular;  Laterality: Bilateral; Pati Gallo 10/09/2022, 10:11 AM  DG CHEST  PORT 1 VIEW  Result Date: 10/09/2022 CLINICAL DATA:  Recent pneumonia, low O2 sats with shortness of breath. EXAM: PORTABLE CHEST 1 VIEW COMPARISON:  Chest radiograph 10/05/2022 FINDINGS: The left-sided vascular catheter is stable with tip terminating in the right atrium. The heart is enlarged, unchanged. The upper mediastinal contours are prominent, also unchanged Lung volumes are low. There is worsened opacity in the right midlung. There is probable mild bibasilar subsegmental atelectasis. There is vascular  congestion and probable mild pulmonary interstitial edema, slightly worsened. There is no significant pleural effusion there is no pneumothorax. There is no acute osseous abnormality. IMPRESSION: Cardiomegaly with vascular congestion and probable mild pulmonary interstitial edema, slightly worsened. More focal airspace opacity in the right midlung could reflect superimposed infection in the correct clinical setting. Electronically Signed   By: Lesia Hausen M.D.   On: 10/09/2022 08:44   DG Chest 2 View  Result Date: 10/05/2022 CLINICAL DATA:  [id number].  Fever. EXAM: CHEST - 2 VIEW COMPARISON:  Chest 10/02/2022 FINDINGS: Inpatient AP Lat at 8:49 p.m. Left IJ dialysis catheter terminates in the upper right atrium. Moderate cardiomegaly. There is interval onset of perihilar vascular congestion and mild-to-moderate interstitial edema in the mid to lower lung fields, findings consistent with CHF or fluid overload. There are increased left-greater-than-right small pleural effusions, with increased left lower lobe opacity consistent with atelectasis, consolidation or aspiration. No other focal consolidation is seen. The mediastinum is stable with aortic ectasia and tortuosity, atherosclerosis. No new osseous findings. Thoracic spondylosis. In all other respects no further changes. IMPRESSION: 1. Cardiomegaly with perihilar vascular congestion and mild-to-moderate interstitial edema consistent with CHF or fluid overload. 2. Increased left-greater-than-right pleural effusions. 3. Increased left lower lobe opacity consistent with atelectasis, consolidation or aspiration. Electronically Signed   By: Almira Bar M.D.   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

## 2022-10-09 NOTE — Progress Notes (Signed)
Physical Therapy Session Note  Patient Details  Name: Ryan [name] MRN: [id number] Date of Birth: November 02, 1960  Today's Date: 10/09/2022 PT Missed Time: 45 Minutes Missed Time Reason: Other (Comment);Unavailable (Comment) (off unit for HD)  Short Term Goals: Week 2:  PT Short Term Goal 1 (Week 2): STG's=LTG's due to ELOS  Skilled Therapeutic Interventions/Progress Updates:      Therapy Documentation Precautions:  Precautions Precautions: Fall Restrictions Weight Bearing Restrictions: No    Pt missed 45 minutes of skilled PT 2/2 to HD. Plan to make up missed minutes as able.   Therapy/Group: Individual Therapy  Truitt Leep Truitt Leep PT, DPT  10/09/2022, 5:52 AM

## 2022-10-09 NOTE — Plan of Care (Signed)
  Problem: Consults Goal: RH GENERAL PATIENT EDUCATION Description: See Patient Education module for education specifics. Outcome: Adequate for Discharge   Problem: RH BOWEL ELIMINATION Goal: RH STG MANAGE BOWEL WITH ASSISTANCE Description: STG Manage Bowel with min Assistance. Outcome: Adequate for Discharge   Problem: RH BLADDER ELIMINATION Goal: RH STG MANAGE BLADDER WITH ASSISTANCE Description: STG Manage Bladder With min Assistance Outcome: Adequate for Discharge   Unable to complete CIR due to medical issues

## 2022-10-09 NOTE — Discharge Summary (Signed)
Physician Discharge Summary  Patient ID: Ryan [name] MRN: [id number] DOB/AGE: Mar 22, 1961 62 y.o.  Admit date: 09/25/2022 Discharge date: 10/09/2022  Discharge Diagnoses:  Principal Problem:   Acute osteomyelitis of lumbar spine (HCC) Active Problems:   ESRD on dialysis (HCC)   Cough   Hyperkalemia   Diskitis   Leukocytosis Mood stabilization MSSA bacteremia SBO with enteritis Anemia of chronic disease Hypertension AHRF D/T cavitary pneumonia Tachycardia Fluid overload A-fib with RVR  Discharged Condition: Guarded  Significant Diagnostic Studies: DG Swallowing Func-Speech Pathology  Result Date: 10/09/2022 Table formatting from the original result was not included. Modified Barium Swallow Study Patient Details Name: Ryan [name] MRN: [id number] Date of Birth: 06/08/1960 Today's Date: 10/09/2022 HPI/PMH: HPI: Pt is a 62 y/o male admitted secondary to acute back pain and found to have L5-S1 discitis/osteomyelitis, SBO and MSSA bacteremia likely 2/2 line infection. Pt had HD cath replaced with rt femoral temp cath. 09/22/22 Hemodialysis Catheter Left Subclavian Double lumen Permanent (Tunneled). 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  PMH including but not limited to hypertension, hyperlipidemia, ESRD. Clinical Impression: Clinical Impression: Overall, pt presented w/ a safe and efficient oropharyngeal swallow. With large sips, oral residue w/ subsequent freespill to BOT and valleculae was noted. However, pt demonstrated excellent sensation of this residue and was consistently able to trigger the additional swallow as needed. Otherwise, adequate pharyngeal clearance noted and no airway invasion was evident throughout trials. Cough x2 noted overall, though unrelated to airway invasion. Would recommend continued diet of regular textures and thin liquids, however, pt requested Dys 3 textures. Continue with meds whole via  liquids. No dypshagia intervention required, conitnue w/ current POC for cognitive tx. Factors that may increase risk of adverse event in presence of aspiration [name] & [name] 2021): Factors that may increase risk of adverse event in presence of aspiration [name] & [name] 2021): Poor general health and/or compromised immunity; Respiratory or GI disease Recommendations/Plan: Swallowing Evaluation Recommendations Swallowing Evaluation Recommendations Recommendations: PO diet PO Diet Recommendation: Regular; Thin liquids (Level 0) Liquid Administration via: Straw; Cup Medication Administration: Whole meds with liquid Supervision: Patient able to self-feed Swallowing strategies  : Minimize environmental distractions; Small bites/sips; Slow rate Postural changes: Stay upright 30-60 min after meals; Position pt fully upright for meals Oral care recommendations: Oral care BID (2x/day); Pt independent with oral care Treatment Plan Treatment Plan Treatment recommendations: Therapy as outlined in treatment plan below Recommendations Comment: no dysphagia intervention required, conitnue w/ current POC for cognition Treatment frequency: Min 1x/week Treatment duration: 1 week Interventions: Other (comment) (no dysphagia intervention - continue w/ cog tx only) Recommendations Recommendations for follow up therapy are one component of a multi-disciplinary discharge planning process, led by the attending physician.  Recommendations may be updated based on patient status, additional functional criteria and insurance authorization. Assessment: Orofacial Exam: Orofacial Exam Oral Cavity: Oral Hygiene: WFL Oral Cavity - Dentition: Adequate natural dentition Orofacial Anatomy: WFL Oral Motor/Sensory Function: WFL Anatomy: Anatomy: WFL Boluses Administered: Boluses Administered Boluses Administered: Thin liquids (Level 0); Mildly thick liquids (Level 2, nectar thick); Puree; Solid  Oral Impairment Domain: Oral Impairment Domain Lip  Closure: No labial escape Tongue control during bolus hold: Cohesive bolus between tongue to palatal seal Bolus preparation/mastication: Timely and efficient chewing and mashing Bolus transport/lingual motion: Brisk tongue motion Oral residue: Trace residue lining oral structures Location of oral residue : Tongue Initiation of pharyngeal swallow : Pyriform sinuses  Pharyngeal Impairment Domain:  Pharyngeal Impairment Domain Soft palate elevation: No bolus between soft palate (SP)/pharyngeal wall (PW) Laryngeal elevation: Complete superior movement of thyroid cartilage with complete approximation of arytenoids to epiglottic petiole Anterior hyoid excursion: Complete anterior movement Epiglottic movement: Complete inversion Laryngeal vestibule closure: Complete, no air/contrast in laryngeal vestibule Pharyngeal stripping wave : Present - complete Pharyngeal contraction (A/P view only): N/A Pharyngoesophageal segment opening: Complete distension and complete duration, no obstruction of flow Tongue base retraction: No contrast between tongue base and posterior pharyngeal wall (PPW) Pharyngeal residue: Trace residue within or on pharyngeal structures Location of pharyngeal residue: Valleculae; Tongue base  Esophageal Impairment Domain: Esophageal Impairment Domain Esophageal clearance upright position: Esophageal retention Pill: No data recorded Penetration/Aspiration Scale Score: Penetration/Aspiration Scale Score 1.  Material does not enter airway: Thin liquids (Level 0); Mildly thick liquids (Level 2, nectar thick); Puree; Solid Compensatory Strategies: Compensatory Strategies Compensatory strategies: Yes Straw: Effective Effective Straw: Thin liquid (Level 0); Mildly thick liquid (Level 2, nectar thick) Multiple swallows: Effective Effective Multiple Swallows: Thin liquid (Level 0); Mildly thick liquid (Level 2, nectar thick)   General Information: Caregiver present: No  Diet Prior to this Study: Regular; Thin liquids  (Level 0)   Temperature : Normal   Respiratory Status: WFL   Supplemental O2: Nasal cannula (3L O2)   History of Recent Intubation: No  Behavior/Cognition: Alert; Cooperative; Pleasant mood Self-Feeding Abilities: Able to self-feed Baseline vocal quality/speech: Normal Volitional Cough: Able to elicit Volitional Swallow: Able to elicit Exam Limitations: No limitations Goal Planning: No data recorded No data recorded No data recorded No data recorded Consulted and agree with results and recommendations: Patient; Nurse; Physician Pain: Pain Assessment Pain Assessment: No/denies pain Pain Score: 0 End of Session: Start Time:No data recorded Stop Time: No data recorded Time Calculation:No data recorded Charges: No data recorded SLP visit diagnosis: No data recorded Past Medical History: Past Medical History: Diagnosis Date  Allergy   Anemia   Blood transfusion without reported diagnosis   Dialysis patient (HCC)   Tues,thurs,sat  ESRD (end stage renal disease) (HCC)   TTHS-   Family history of adverse reaction to anesthesia   father had allergic reaction with anesthesia with a dental procedure-(pt. doesn't know)  Hyperlipidemia   Hypertension  Past Surgical History: Past Surgical History: Procedure Laterality Date  A/V FISTULAGRAM Left 12/15/2016  Procedure: A/V Fistulagram - left;  Surgeon: Chuck Hint, MD;  Location: [facility] INVASIVE CV LAB;  Service: Cardiovascular;  Laterality: Left;  AV FISTULA PLACEMENT Left 08/28/2016  Procedure: LEFT UPPER  ARM ARTERIOVENOUS (AV) FISTULA CREATION;  Surgeon: Chuck Hint, MD;  Location: [facility] OR;  Service: Vascular;  Laterality: Left;  DIALYSIS/PERMA CATHETER INSERTION Right 12/21/2017  Procedure: INSERTION OF DIALYSIS CATHETER Right Internal Jugular .;  Surgeon: Sherren Kerns, MD;  Location: [facility] OR;  Service: Vascular;  Laterality: Right;  FISTULA SUPERFICIALIZATION Left 09/23/2017  Procedure: FISTULA PLICATION LEFT ARM;  Surgeon: Sherren Kerns, MD;  Location:  [facility] OR;  Service: Vascular;  Laterality: Left;  FISTULOGRAM Left 12/21/2017  Procedure: FISTULOGRAM with Balloon Angioplasty.;  Surgeon: Sherren Kerns, MD;  Location: [facility] OR;  Service: Vascular;  Laterality: Left;  HEMATOMA EVACUATION Left 08/27/2020  Procedure: EVACUATION HEMATOMA LEFT ARM;  Surgeon: Sherren Kerns, MD;  Location: [facility] OR;  Service: Vascular;  Laterality: Left;  IR FLUORO GUIDE CV LINE LEFT  09/22/2022  IR FLUORO GUIDE CV LINE RIGHT  09/12/2022  IR FLUORO GUIDE CV LINE RIGHT  09/17/2022  IR REMOVAL TUN CV  CATH W/O FL  09/09/2022  IR THORACENTESIS ASP PLEURAL SPACE W/IMG GUIDE  08/22/2017  1.2 L -right-sided  IR THORACENTESIS ASP PLEURAL SPACE W/IMG GUIDE  10/19/2017  IR US GUIDE VASC ACCESS LEFT  09/22/2022  IR US GUIDE VASC ACCESS RIGHT  09/12/2022  KIDNEY TRANSPLANT    REVISON OF ARTERIOVENOUS FISTULA Left 12/21/2017  Procedure: PLICATION and Ligation of F LEFT ARM ARTERIOVENOUS FISTULA;  Surgeon: Sherren Kerns, MD;  Location: [facility] OR;  Service: Vascular;  Laterality: Left;  REVISON OF ARTERIOVENOUS FISTULA Left 07/16/2020  Procedure: EXCISION OF LEFT ARM ARTERIOVENOUS FISTULA;  Surgeon: Sherren Kerns, MD;  Location: [facility] OR;  Service: Vascular;  Laterality: Left;  stent in kidneys    dec 2017  TRANSTHORACIC ECHOCARDIOGRAM  09/22/2017   Severe LVH.  Normal function -EF 55-60%.  GRII DD.  Moderate RV dilation with mildly reduced RV function.   Fixed right coronary cusp with very mild aortic stenosis.  The myocardium has a speckled appearance --> .   Recommend cardiac MRI to evaluate for amyloid  UPPER EXTREMITY VENOGRAPHY Bilateral 06/01/2020  Procedure: UPPER EXTREMITY VENOGRAPHY;  Surgeon: Chuck Hint, MD;  Location: [facility] INVASIVE CV LAB;  Service: Cardiovascular;  Laterality: Bilateral; Pati Gallo 10/09/2022, 10:11 AM  DG CHEST PORT 1 VIEW  Result Date: 10/09/2022 CLINICAL DATA:  Recent pneumonia, low O2 sats with shortness of breath. EXAM: PORTABLE CHEST 1 VIEW COMPARISON:  Chest  radiograph 10/05/2022 FINDINGS: The left-sided vascular catheter is stable with tip terminating in the right atrium. The heart is enlarged, unchanged. The upper mediastinal contours are prominent, also unchanged Lung volumes are low. There is worsened opacity in the right midlung. There is probable mild bibasilar subsegmental atelectasis. There is vascular congestion and probable mild pulmonary interstitial edema, slightly worsened. There is no significant pleural effusion there is no pneumothorax. There is no acute osseous abnormality. IMPRESSION: Cardiomegaly with vascular congestion and probable mild pulmonary interstitial edema, slightly worsened. More focal airspace opacity in the right midlung could reflect superimposed infection in the correct clinical setting. Electronically Signed   By: Lesia Hausen M.D.   On: 10/09/2022 08:44   DG Chest 2 View  Result Date: 10/05/2022 CLINICAL DATA:  [id number].  Fever. EXAM: CHEST - 2 VIEW COMPARISON:  Chest 10/02/2022 FINDINGS: Inpatient AP Lat at 8:49 p.m. Left IJ dialysis catheter terminates in the upper right atrium. Moderate cardiomegaly. There is interval onset of perihilar vascular congestion and mild-to-moderate interstitial edema in the mid to lower lung fields, findings consistent with CHF or fluid overload. There are increased left-greater-than-right small pleural effusions, with increased left lower lobe opacity consistent with atelectasis, consolidation or aspiration. No other focal consolidation is seen. The mediastinum is stable with aortic ectasia and tortuosity, atherosclerosis. No new osseous findings. Thoracic spondylosis. In all other respects no further changes. IMPRESSION: 1. Cardiomegaly with perihilar vascular congestion and mild-to-moderate interstitial edema consistent with CHF or fluid overload. 2. Increased left-greater-than-right pleural effusions. 3. Increased left lower lobe opacity consistent with atelectasis, consolidation or aspiration.  Electronically Signed   By: Almira Bar M.D.   On: 10/05/2022 21:03   DG CHEST PORT 1 VIEW  Result Date: 10/02/2022 CLINICAL DATA:  Congestion and cough EXAM: PORTABLE CHEST 1 VIEW COMPARISON:  09/22/2022 FINDINGS: Cardiac shadow is enlarged but stable. Dialysis catheter is now seen on the left in satisfactory position. The overall inspiratory effort is poor with crowding of the vascular markings. No focal infiltrate or sizable effusion is noted. IMPRESSION: Poor inspiratory  effort.  No acute abnormality noted. Electronically Signed   By: Alcide Clever M.D.   On: 10/02/2022 11:43   IR Fluoro Guide CV Line Left  Result Date: 09/22/2022 CLINICAL DATA:  Renal insufficiency, needs durable venous access for hemodialysis EXAM: TUNNELED HEMODIALYSIS CATHETER PLACEMENT WITH ULTRASOUND AND FLUOROSCOPIC GUIDANCE TECHNIQUE: The procedure, risks, benefits, and alternatives were explained to the patient. Questions regarding the procedure were encouraged and answered. The patient understands and consents to the procedure. As antibiotic prophylaxis, cefazolin 2 g was ordered pre-procedure and administered intravenously within one hour of incision.Patency of the left IJ vein was confirmed with ultrasound with image documentation. An appropriate skin site was determined. Region was prepped using maximum barrier technique including cap and mask, sterile gown, sterile gloves, large sterile sheet, and Chlorhexidine as cutaneous antisepsis. The region was infiltrated locally with 1% lidocaine. Intravenous Fentanyl and Versed 1.5mg  were administered as conscious sedation during continuous monitoring of the patient's level of consciousness and physiological / cardiorespiratory status by the radiology RN, with a total moderate sedation time of 20 minutes. Under real-time ultrasound guidance, the left IJ vein was accessed with a 21 gauge micropuncture needle; the needle tip within the vein was confirmed with ultrasound image  documentation. Needle exchanged over the 018 guidewire for transitional dilator. Limited venogram performed demonstrating central venous patency. Benson wire was advanced into the IVC. Over this, an MPA catheter was advanced. A Palindrome 23 hemodialysis catheter was tunneled from the left anterior chest wall approach to the left IJ dermatotomy site. The MPA catheter was exchanged over an Amplatz wire for serial vascular dilators which allow placement of a peel-away sheath, through which the catheter was advanced under intermittent fluoroscopy, positioned with its tips in the proximal and midright atrium. Spot chest radiograph confirms good catheter position. No pneumothorax. Catheter was flushed and primed per protocol. Catheter secured externally with O Prolene sutures. The left IJ dermatotomy site was closed with Dermabond. The temporary right femoral hemodialysis catheter was removed and hemostasis achieved at the site with manual compression. The patient tolerated the procedure well. COMPLICATIONS: COMPLICATIONS None immediate FLUOROSCOPY: Radiation Exposure Index (as provided by the fluoroscopic device): 77 mGy air Kerma COMPARISON:  None Available. IMPRESSION: 1. Technically successful placement of tunneled left IJ hemodialysis catheter with ultrasound and fluoroscopic guidance. Ready for routine use. ACCESS: Remains approachable for percutaneous intervention as needed. Electronically Signed   By: Corlis Leak M.D.   On: 09/22/2022 16:15   IR US Guide Vasc Access Left  Result Date: 09/22/2022 CLINICAL DATA:  Renal insufficiency, needs durable venous access for hemodialysis EXAM: TUNNELED HEMODIALYSIS CATHETER PLACEMENT WITH ULTRASOUND AND FLUOROSCOPIC GUIDANCE TECHNIQUE: The procedure, risks, benefits, and alternatives were explained to the patient. Questions regarding the procedure were encouraged and answered. The patient understands and consents to the procedure. As antibiotic prophylaxis, cefazolin 2 g  was ordered pre-procedure and administered intravenously within one hour of incision.Patency of the left IJ vein was confirmed with ultrasound with image documentation. An appropriate skin site was determined. Region was prepped using maximum barrier technique including cap and mask, sterile gown, sterile gloves, large sterile sheet, and Chlorhexidine as cutaneous antisepsis. The region was infiltrated locally with 1% lidocaine. Intravenous Fentanyl and Versed 1.5mg  were administered as conscious sedation during continuous monitoring of the patient's level of consciousness and physiological / cardiorespiratory status by the radiology RN, with a total moderate sedation time of 20 minutes. Under real-time ultrasound guidance, the left IJ vein was accessed with a  21 gauge micropuncture needle; the needle tip within the vein was confirmed with ultrasound image documentation. Needle exchanged over the 018 guidewire for transitional dilator. Limited venogram performed demonstrating central venous patency. Benson wire was advanced into the IVC. Over this, an MPA catheter was advanced. A Palindrome 23 hemodialysis catheter was tunneled from the left anterior chest wall approach to the left IJ dermatotomy site. The MPA catheter was exchanged over an Amplatz wire for serial vascular dilators which allow placement of a peel-away sheath, through which the catheter was advanced under intermittent fluoroscopy, positioned with its tips in the proximal and midright atrium. Spot chest radiograph confirms good catheter position. No pneumothorax. Catheter was flushed and primed per protocol. Catheter secured externally with O Prolene sutures. The left IJ dermatotomy site was closed with Dermabond. The temporary right femoral hemodialysis catheter was removed and hemostasis achieved at the site with manual compression. The patient tolerated the procedure well. COMPLICATIONS: COMPLICATIONS None immediate FLUOROSCOPY: Radiation  Exposure Index (as provided by the fluoroscopic device): 77 mGy air Kerma COMPARISON:  None Available. IMPRESSION: 1. Technically successful placement of tunneled left IJ hemodialysis catheter with ultrasound and fluoroscopic guidance. Ready for routine use. ACCESS: Remains approachable for percutaneous intervention as needed. Electronically Signed   By: Corlis Leak M.D.   On: 09/22/2022 16:15   DG CHEST PORT 1 VIEW  Result Date: 09/22/2022 CLINICAL DATA:  Acute respiratory distress. History of end-stage renal disease. EXAM: PORTABLE CHEST 1 VIEW COMPARISON:  CT 09/07/2022 FINDINGS: There is enlargement of the cardiac silhouette which is favored to represent artifact from portable technique, although underlying pericardial effusion would be difficult to exclude. Lung volumes are low. There is new opacification of the retrocardiac left lower lobe. New airspace opacity is identified within the left upper lobe. New nodular density with central cavitation is suspected within the apical segment of the left upper lobe measuring 1.2 cm. The right lung appears relatively clear. IMPRESSION: 1. New airspace opacity within the left upper lobe and retrocardiac left lower lobe concerning for pneumonia. 2. New nodular density with central cavitation within the apical segment of the left upper lobe. Consider follow-up CT of the chest for more definitive characterization. 3. Enlargement of the cardiac silhouette compared with previous exam may reflect artifact from AP portable technique. Underlying pericardial effusion would be difficult to exclude. Electronically Signed   By: Signa Kell M.D.   On: 09/22/2022 07:17   EEG adult  Result Date: 09/19/2022 Charlsie Quest, MD     09/19/2022  7:11 AM Patient Name: Ryan [name] MRN: [id number] Epilepsy Attending: Charlsie Quest Referring Physician/Provider: Jerald Kief, MD Date: 09/18/2022 Duration: 24.29 mins Patient history: 62yo M with ams getting eeg to evaluate  for seizure. Level of alertness: Awake AEDs during EEG study: None Technical aspects: This EEG study was done with scalp electrodes positioned according to the 10-20 International system of electrode placement. Electrical activity was reviewed with band pass filter of 1-70Hz , sensitivity of 7 uV/mm, display speed of 11mm/sec with a 60Hz  notched filter applied as appropriate. EEG data were recorded continuously and digitally stored.  Video monitoring was available and reviewed as appropriate. Description: The posterior dominant rhythm consists of 7Hz  activity of moderate voltage (25-35 uV) seen predominantly in posterior head regions, symmetric and reactive to eye opening and eye closing. EEG showed continuous generalized 3 to 6 Hz theta-delta slowing. Physiologic photic driving was not seen during photic stimulation.  Hyperventilation was not performed.   ABNORMALITY -  Continuous slow, generalized - Background slow IMPRESSION: This study is suggestive of moderate diffuse encephalopathy, nonspecific etiology. No seizures or epileptiform discharges were seen throughout the recording. Charlsie Quest   MR BRAIN WO CONTRAST  Result Date: 09/19/2022 CLINICAL DATA:  Initial evaluation for neuro deficit, stroke. EXAM: MRI HEAD WITHOUT CONTRAST TECHNIQUE: Multiplanar, multiecho pulse sequences of the brain and surrounding structures were obtained without intravenous contrast. COMPARISON:  CT from earlier the same day. FINDINGS: Brain: Examination technically limited by motion and patient's inability to tolerate the full length of the study. Cerebral volume within normal limits. No significant cerebral white matter disease for age. Scattered predominantly subcentimeter foci of restricted diffusion are seen involving the bilateral cerebral hemispheres and right cerebellum, consistent with small acute ischemic infarcts. For reference purposes, the largest infarct seen at the right cerebellum and measures 8 mm. Probable  additional subtle evolving infarct noted at the right ventral pons (series 5, image 73). No associated hemorrhage or mass effect. No mass lesion, midline shift or mass effect. No hydrocephalus or extra-axial fluid collection. Vascular: Major intracranial vascular flow voids are maintained. Skull and upper cervical spine: Craniocervical junction grossly within normal limits. Bone marrow signal intensity grossly normal. No scalp soft tissue abnormality. Sinuses/Orbits: Globes orbital soft tissues within normal limits. Paranasal sinuses are largely clear. No significant mastoid effusion. Other: None. IMPRESSION: 1. Technically limited exam due to motion and the patient's inability to tolerate the full study. 2. Scattered subcentimeter acute ischemic infarcts involving the bilateral cerebral hemispheres and right cerebellum. No associated hemorrhage or mass effect. Electronically Signed   By: Rise Mu M.D.   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 RADIATION DOSE REDUCTION: This exam was performed according to the departmental dose-optimization program which includes automated exposure control, adjustment of the mA and/or kV according to patient size and/or use of iterative reconstruction technique. COMPARISON:  None FINDINGS: Brain: No hemorrhage. No hydrocephalus. No extra-axial fluid collection. No mass effect. There is subcortical hypodensity along the superior frontal lobes bilaterally (series 7, image 27) which is nonspecific but favored to represent sequela of chroinc microvascular ischemic change Vascular: No hyperdense vessel or unexpected calcification. Skull: Normal. Negative for fracture or focal lesion.  Sinuses/Orbits: No middle ear or mastoid effusion paranasal sinuses are clear orbits are unremarkable Other: None. IMPRESSION: No acute abnormality or definite finding to suggest septic emboli on this non contrast enhanaced head CT. If there is high clinical concern for an infarct or intracranial infection, further evaluation with a contrast enhanaced brain MRI is recommended. Electronically Signed   By: Lorenza [name] M.D.   On: 09/18/2022 16:11   IR Fluoro Guide CV Line Right  Result Date: 09/17/2022 CLINICAL DATA:  Poor function of non tunneled right femoral hemodialysis catheter. Patient has contrast allergy and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

## 2022-10-09 NOTE — Progress Notes (Signed)
Occupational Therapy Discharge Note  This patient was unable to complete the inpatient rehab program due to admission to acute care for medical condition; therefore did not meet their long term goals. Pt left the program at a min-mod assist level for their  functional ADLs. This patient is being discharged from OT services at this time.  BIMS at time of d/c  Pt unable to complete due to medical status  See CareTool for functional status details.  If the patient is able to return to inpatient rehabilitation within 3 midnights, this may be considered an interrupted stay and therapy services will resume as ordered. Modification and reinstatement of their goals will be made upon completion of therapy service reevaluations.

## 2022-10-09 NOTE — Progress Notes (Signed)
PROGRESS NOTE   Subjective/Complaints:  Pt reports feels really SOB- sats 88-91% on 2 L O2- resp therapy bumped him to 3L which I agree with.  Is fluid overload. From blood 2nd unit yesterday and IVF's earlier in week.   Weight up 5.5 kg in 5 days.  Also coughing fits- spit up saliva/vomited last night because coughing so much-  No sleep due to SOB/orthopnea.  Did get blood yesterday.  Feels like struggling to breathe- making him stressed.     ROS:   Pt denies: (+) SOB, abd pain, CP, N/V/C/D, and vision changes- mainly SOB  Except for HPI  Objective:   DG CHEST PORT 1 VIEW  Result Date: 10/09/2022 CLINICAL DATA:  Recent pneumonia, low O2 sats with shortness of breath. EXAM: PORTABLE CHEST 1 VIEW COMPARISON:  Chest radiograph 10/05/2022 FINDINGS: The left-sided vascular catheter is stable with tip terminating in the right atrium. The heart is enlarged, unchanged. The upper mediastinal contours are prominent, also unchanged Lung volumes are low. There is worsened opacity in the right midlung. There is probable mild bibasilar subsegmental atelectasis. There is vascular congestion and probable mild pulmonary interstitial edema, slightly worsened. There is no significant pleural effusion there is no pneumothorax. There is no acute osseous abnormality. IMPRESSION: Cardiomegaly with vascular congestion and probable mild pulmonary interstitial edema, slightly worsened. More focal airspace opacity in the right midlung could reflect superimposed infection in the correct clinical setting. Electronically Signed   By: Lesia Hausen M.D.   On: 10/09/2022 08:44   Recent Labs    10/07/22 0548 10/08/22 0605  WBC 12.9* 13.7*  HGB 6.1* 7.0*  HCT 20.0* 22.6*  PLT 358 349   Recent Labs    10/08/22 0605  NA 127*  K 3.7  CL 92*  CO2 25  GLUCOSE 108*  BUN 30*  CREATININE 6.04*  CALCIUM 7.8*    Intake/Output Summary (Last 24 hours) at  10/09/2022 0912 Last data filed at 10/09/2022 7846 Gross per 24 hour  Intake 780 ml  Output --  Net 780 ml         Physical Exam: Vital Signs Blood pressure 117/74, pulse (!) 108, temperature 97.9 F (36.6 C), temperature source Oral, resp. rate (!) 26, height 5\' 11"  (1.803 m), weight 82.6 kg, SpO2 96%.      General: awake, alert, appropriate, appears SOB; sitting up in w/c HENT: conjugate gaze; oropharynx a little dry- wearing O2 by [place] 2-3L CV: regular  rhythm, tachycardic  (110's) rate Pulmonary: no W/R/R, but very decreased throughout but esp at bases GI: soft, NT, protuberant- fluid?- hypoactive BS Psychiatric: appropriate- appears a little anxious over SOB- appropriately Neurological: Ox3 Extremities- has trace LE edema only B/L  PRIOR EXAMS: Skin: + L upper chest HD catheter - dressing c/d/i + Nonpainful erythematous lesions on bilateral feet  MSK:      No apparent deformity.      Strength: Exam limited d/t being on HD; all 4 limbs equal, antigravity and against resistance   Neurologic exam:  Cognition: AAO to person, place, time and event.  + Mild lethargy - intermittently falling asleep during late dialysis; more awake in early exam Language: Fluent,  No substitutions or neoglisms. No dysarthria. Names 3/3 objects correctly.  Memory: Recalls 3/3 objects at 5 minutes. No apparent deficits  Insight: Good  insight into current condition.  Mood: Pleasant affect, appropriate mood.  Sensation: To light touch intact in BL UEs and LEs  Reflexes: 1+ in BL UE and LEs. Negative Hoffman's and babinski signs bilaterally.  CN: 2-12 grossly intact.  Coordination: No apparent tremors. No ataxia on FTN, HTS bilaterally.  Spasticity: MAS 0 in all extremities.       Assessment/Plan: 1. Functional deficits which require 3+ hours per day of interdisciplinary therapy in a comprehensive inpatient rehab setting. Physiatrist is providing close team supervision and 24 hour management  of active medical problems listed below. Physiatrist and rehab team continue to assess barriers to discharge/monitor patient progress toward functional and medical goals  Care Tool:  Bathing    Body parts bathed by patient: Right arm, Left arm, Chest, Abdomen, Front perineal area, Buttocks, Right upper leg, Left upper leg, Right lower leg, Left lower leg, Face         Bathing assist Assist Level: Set up assist     Upper Body Dressing/Undressing Upper body dressing   What is the patient wearing?: Button up shirt    Upper body assist Assist Level: Minimal Assistance - Patient > 75%    Lower Body Dressing/Undressing Lower body dressing      What is the patient wearing?: Underwear/pull up     Lower body assist Assist for lower body dressing: Minimal Assistance - Patient > 75%     Toileting Toileting    Toileting assist Assist for toileting: Supervision/Verbal cueing     Transfers Chair/bed transfer  Transfers assist     Chair/bed transfer assist level: Supervision/Verbal cueing     Locomotion Ambulation   Ambulation assist      Assist level: Minimal Assistance - Patient > 75% Assistive device: Rollator Max distance: 170'   Walk 10 feet activity   Assist     Assist level: Minimal Assistance - Patient > 75% Assistive device: Walker-rolling   Walk 50 feet activity   Assist    Assist level: Minimal Assistance - Patient > 75% Assistive device: Walker-rolling    Walk 150 feet activity   Assist    Assist level: Minimal Assistance - Patient > 75% Assistive device: Walker-rolling    Walk 10 feet on uneven surface  activity   Assist Walk 10 feet on uneven surfaces activity did not occur: Safety/medical concerns         Wheelchair     Assist Is the patient using a wheelchair?: Yes Type of Wheelchair: Manual    Wheelchair assist level: Dependent - Patient 0% (pain limits)      Wheelchair 50 feet with 2 turns  activity    Assist        Assist Level: Dependent - Patient 0%   Wheelchair 150 feet activity     Assist      Assist Level: Dependent - Patient 0%   Blood pressure 117/74, pulse (!) 108, temperature 97.9 F (36.6 C), temperature source Oral, resp. rate (!) 26, height 5\' 11"  (1.803 m), weight 82.6 kg, SpO2 96%.  Medical Problem List and Plan: 1. Functional deficits secondary to lumbar osteomyelitis/diskitis d/t MSSA bacteremia.heart vegetations/Endocarditis              -patient may shower             -ELOS/Goals: PT/OT/SLP supervision to mod I, 10-14 days  -  d/c 7/19  Only bed level therapy- will not d/c 7/19 unless pops back up after transfusion  Changed d/c to 7/24- might need longer again because missing more therapy- d/w pt about leaving rehab- he is emphatic he doesn't want to unless no choice  Con't CIR PT and OT and SLP MBSS today at 9am 2.  Antithrombotics: -DVT/anticoagulation:  Pharmaceutical: Heparin 5000 q8h             -antiplatelet therapy: N/A 3. Low back pain/Pain Management: tylenol, Oxycodone prn. Advised to use oxycodone prior to sleep --will schedule low dose robaxin 250mg  bid as reports ongoing lower back and LE spasms.  -7/5- will increase Oxy to 10-15 mg q4 hours prn-can add long acting over weekend if necessary -7/6-7/24 pain seems controlled for now, monitor 7/12- pain doing much better at 7.5-10 mg q4 hours prn -7/14-7/16pain controlled overall 4. Mood/Behavior/Sleep: LCSW to follow for evaluation and support.              -antipsychotic agents: N/A --acute on chronic insomnia in part due to anxiety. Will schedule low dose trazodone. Continue prn melatonin -7/5- will change trazodone to 75 mg at bedtime- and can be titrated more over weekend if needed.  7/9- sleeping well  7/10- didn't sleep well- will increase trazodone to 150 mg at bedtime 7/12- slept somewhat better- con't trazodone -10/05/22 sleeping better 7/17- poor sleep- will d/w pt  more if occurs again tonight 7/18- poor sleep due to SOB 5. Neuropsych/cognition: This patient is capable of making decisions on his own behalf. 6. Skin/Wound Care: Routine pressure relief measures -continue to monitor new lesions on bilateral feet for evolution/progress   7. Fluids/Electrolytes/Nutrition: Strict I/O. Daily weights. Labs TTS with HD. Add low salt restrictions             --continue Nephro supplements 8. 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  --IV cefazolin with HD. EOT 11/04/22              --Fevers have resolved. CRP-44/Sed rate 1.5 @ admission.  --Leucocytosis trending down from 23.9-->31.4-->13.9.    -7/5- Pt's WBC down to 13/9- will recheck labs in AM as well as qmonday -09/27/22 WBC 13.2, cont to monitor 7/8- WBC stable at 13k- CRP 17.3 and ESR >140- will call ID- they said ESR not real helpful in ESRD pt's- so to make sure CRP improving and pt looks good, to call them back before pt leaves.  Called, since ESR rising- last checked 3 weeks ago and WBC won't resolve. 7/9- ID ok with labs since ESR not a good indicator in ESRD- follow labs weekly.   7/12- will call ID before leaves rehab 7/15- CRP slightly up to 20 and ESR down to 100 7/17- on Zosyn for now due to pneumonia, but will be switched back to Cefazolin in 5 days? 9. SBO w/ enteritis: Has resolved. Surgery team signed off 10. ESRD: HD TTS at the end of the day to help with tolerance of therapy.              --strict I/O with daily weights. Continues to require 4 L oxygen per [place].  --On Liberalized diet but 1200 cc FR/4 gram salt.  Velphoro for elevated phos level. -7/5- Na down to 128- per Renal  -09/27/22 dialysis today 7/9- K+_ 5.3- this AM and Na 127- per renal to intervene- they will likely check labs and  follow up 7/10- K+ up, but then held HD yesterday. Na 126-  7/11- Na 126 and K_  5.2 still- also d/w pt with renal about midodrine- per renal.  7/12- Na up to 128-K+ 4.0 7/15- Na 129- K+ 3.8 11. Anemia of chronic disease: On Aranesp weekly every Thursday w/HD.   7/12- Hb 8.5- better than was of 7.8-8.2  7/15- Hb down to 6.3- concern for GI bleed  7/16- No BM yesterday- feels constipated- so couldn't get FOBT done- will do sorbitol and ask nursing to check FOBT  7/17- FOBT (+)_ called GI consult- ordered 2nd unit since at 7.0.  12. HTN: Monitor BP TID. On low dose tenormin 12.5mg  every day but BP with drops during HD.  -7/5- BP running low 90s systolic when laying down- I'm concerned will have orthostatic hypotension-will check with PT/OT  -09/27/22 BPs soft, monitor closely -09/28/22 BP a little softer today, hold atenolol this morning, monitor to see if changes need to be made to that. Denies orthostatic symptoms.  7/8- held atenolol- BP dropped to 60-70's systolic yesterday- was 90s SBP this AM- will monitor- midodrine increased yesterday to 2.5 mg 7/9- will hold atenolol since BP 104 systolic and going to HD today.   7/10- stopped Atenolol and increased Midodrine to 5 mg BID with breakfast/lunch 7/11- increased midodrine to 5 mg TID- but think he might benefit from 10 mg since BP has been running 70s with sitting/standing with therapy.  7/12- BP running 80's this AM- Midodrine up 5-10 mg TID- depending on HD days- asymptomatic as usual- will d/w therapy again.  7/15- BP low, but likely due to ABLA 7/17- BP doing better- not so low- likely due to transfusion  7/18- BP better after 2 unit of blood. Will monitor Vitals:   10/08/22 0433 10/08/22 0837 10/08/22 1236 10/08/22 1638  BP: 111/69 (!) 103/59 105/60 123/65   10/08/22 1833 10/08/22 2001 10/08/22 2130 10/08/22 2208  BP: 117/65 105/61 121/68 107/71   10/09/22 0045 10/09/22 0515 10/09/22 0545 10/09/22 0630  BP: 114/72 120/75 122/71 117/74    13. AHRF d/t cavitary pneumonia. Wean oxygen as able. Currently 2-4L; per  patient not using CPAP/BiPAP   -7/5- will wean O2 as allowed  -09/27/22 no O2 on today, monitor  -10/04/22 needing O2 last night, doing well today, monitor 14. Constipation:  -09/28/22 had a small BM yesterday, lots of gas, will do dose of miralax now then add on QD PRN dose, advised to take senna with the miralax dose now, then could use the PRN miralax dose after lunch if still not successful-- if not working, then use suppository +/- enema.  7/8- LBM  7/6- small- feels like needs to go- wants to wait on more intervention.   7/9- Had good sized BM yesterday- feeling better  7/10- bowels "going ok".   -10/05/22 LBM this morning, doing well, monitor  7/16- feels constipated- LBM 2 days ago- will give sorbitol now-   7/17- 2 Bms yesterday- was FOBT (+) 15. New cough/mild SOB  7/11- got CXR_ concern about fluid overload/heart decline in working? Due to enlarged cardiac silhouette and appears like effusion/possible infiltrate- Called IM consult to address- CXR read pending.   7/12- CXR shows more atelectasis- will order flutter valve and tessalon pearls 100 mg BID for cough. Pt reports still bothersome   -10/04/22 cough better, needing O2 today but feels ok; hopefully dialysis today will help with volume; monitor  -10/05/22 no O2 today, somewhat shallow breathing but not  really increased WOB; monitor closely  7/15- will order albuterol nebs scheduled for 2 days then PRN and cough meds with codeine.   7/16- says albuterol has made "a world of difference"- in cough/breathing- instant improvement  7/17- down to 2 L O2 by [place]-   7/18- fluid overload due to blood, etc- so increased O2 to 3L 16. Severe protein Malnutrition  7/12- Alb down to 1.6- will d/w team about what to add for appetite stimulation- thinking Remeron?will order dietitian consult.   7/16- will order another dietitian consult.  17. Tachycardia:  -10/05/22 tachycardic since dialysis yesterday, but completely asymptomatic-- maybe from  midodrine? Recently increased. Monitor closely, doubt need for urgent intervention or work up right now  7/15- likely due to low Hb.   7/16- still going on, however has Hb of 6.1 this AM- hopefully will improve when gets blood.   7/17- about the same- Hb up to 7.0  7/18- got 1 more unit blood, Tachycardia still up- but now has fluid overload.  18. Fluid overload- SOB/Orthopnea  7/18- from blood and IVFs got earlier in week- up 5.5 kg in last 5 days- spoke with renal- will increase O2 to 3 L and will get in HD sooner this Am and get more fluid off him per renal. Also spoke with resp therapy.      I spent a total of 1 hour 30 minutes- seen pt 2x   minutes on total care today- >50% coordination of care- due to also spoke with renal as well as SLP and resp therapy and nursing and OT about care-  Called renal - will get pt into HD sooner this AM- due to fluid overload- also went over CXR- independently and reviewed result as well- renal to check CBC with diff in HD today    LOS: 14 days A FACE TO FACE EVALUATION WAS PERFORMED  Wileen Duncanson 10/09/2022, 9:12 AM

## 2022-10-09 NOTE — Progress Notes (Addendum)
Patient having two episodes of melena tonight. As well as one episode of emesis. Continues to have intermittent episodes of shortness of breath; hx of PND. Repositioned several times in bed and head of bed elevated. Endorses lower back pain. No GI discomfort reported by patient. Does express irritation and burning sensation in throat. Coughing throughout the evening & night. Cough is productive and non-productive at times. No dizziness or lightheadedness reported by patient. No other symptoms endorses. Continues to be restless and having difficulty sleeping; Given PRN medication for sleep as well as scheduled sleep medication.

## 2022-10-09 NOTE — Plan of Care (Signed)

## 2022-10-09 NOTE — Progress Notes (Signed)
Speech Therapy Discharge Note  This patient was unable to complete the inpatient rehab program due to admission to acute care for medical needs ; therefore, the patient did not meet their long term goals and has been discharged from skilled SLP services at this time.The patient left the program at a supervision assist level for overall cognitive functioning. The patient is currently consuming regular textures with thin liquids with mod I assist for use of swallowing compensatory strategies.   See CareTool for functional status details.  If the patient is able to return to inpatient rehabilitation within 3 midnights, this may be considered an interrupted stay and therapy services will resume as ordered. Modification and reinstatement of their goals will be made upon completion of therapy service reevaluations.    Masa Lubin M.A., CF-SLP

## 2022-10-10 DIAGNOSIS — M869 Osteomyelitis, unspecified: Secondary | ICD-10-CM | POA: Diagnosis not present

## 2022-10-10 DIAGNOSIS — R7881 Bacteremia: Secondary | ICD-10-CM

## 2022-10-10 DIAGNOSIS — B9561 Methicillin susceptible Staphylococcus aureus infection as the cause of diseases classified elsewhere: Secondary | ICD-10-CM | POA: Diagnosis not present

## 2022-10-10 DIAGNOSIS — I2690 Septic pulmonary embolism without acute cor pulmonale: Secondary | ICD-10-CM | POA: Diagnosis not present

## 2022-10-10 DIAGNOSIS — D638 Anemia in other chronic diseases classified elsewhere: Secondary | ICD-10-CM

## 2022-10-10 DIAGNOSIS — Z992 Dependence on renal dialysis: Secondary | ICD-10-CM | POA: Diagnosis not present

## 2022-10-10 DIAGNOSIS — R195 Other fecal abnormalities: Secondary | ICD-10-CM | POA: Diagnosis not present

## 2022-10-10 DIAGNOSIS — M4647 Discitis, unspecified, lumbosacral region: Secondary | ICD-10-CM | POA: Diagnosis not present

## 2022-10-10 DIAGNOSIS — I120 Hypertensive chronic kidney disease with stage 5 chronic kidney disease or end stage renal disease: Secondary | ICD-10-CM | POA: Diagnosis not present

## 2022-10-10 DIAGNOSIS — N186 End stage renal disease: Secondary | ICD-10-CM | POA: Diagnosis not present

## 2022-10-10 DIAGNOSIS — D631 Anemia in chronic kidney disease: Secondary | ICD-10-CM | POA: Diagnosis not present

## 2022-10-10 DIAGNOSIS — I4891 Unspecified atrial fibrillation: Secondary | ICD-10-CM | POA: Diagnosis not present

## 2022-10-10 DIAGNOSIS — N250 Renal osteodystrophy: Secondary | ICD-10-CM | POA: Diagnosis not present

## 2022-10-10 DIAGNOSIS — I39 Endocarditis and heart valve disorders in diseases classified elsewhere: Secondary | ICD-10-CM | POA: Diagnosis not present

## 2022-10-10 LAB — CBC
HCT: 25.8 % — ABNORMAL LOW (ref 39.0–52.0)
Hemoglobin: 7.9 g/dL — ABNORMAL LOW (ref 13.0–17.0)
MCH: 25.4 pg — ABNORMAL LOW (ref 26.0–34.0)
MCHC: 30.6 g/dL (ref 30.0–36.0)
MCV: 83 fL (ref 80.0–100.0)
Platelets: 363 10*3/uL (ref 150–400)
RBC: 3.11 MIL/uL — ABNORMAL LOW (ref 4.22–5.81)
RDW: 16.9 % — ABNORMAL HIGH (ref 11.5–15.5)
WBC: 14 10*3/uL — ABNORMAL HIGH (ref 4.0–10.5)
nRBC: 1.9 % — ABNORMAL HIGH (ref 0.0–0.2)

## 2022-10-10 LAB — CULTURE, BLOOD (ROUTINE X 2)
Culture: NO GROWTH
Culture: NO GROWTH
Special Requests: ADEQUATE
Special Requests: ADEQUATE

## 2022-10-10 LAB — COMPREHENSIVE METABOLIC PANEL
ALT: 7 U/L (ref 0–44)
AST: 41 U/L (ref 15–41)
Albumin: 2 g/dL — ABNORMAL LOW (ref 3.5–5.0)
Alkaline Phosphatase: 107 U/L (ref 38–126)
Anion gap: 13 (ref 5–15)
BUN: 32 mg/dL — ABNORMAL HIGH (ref 8–23)
CO2: 23 mmol/L (ref 22–32)
Calcium: 8.3 mg/dL — ABNORMAL LOW (ref 8.9–10.3)
Chloride: 90 mmol/L — ABNORMAL LOW (ref 98–111)
Creatinine, Ser: 6.5 mg/dL — ABNORMAL HIGH (ref 0.61–1.24)
GFR, Estimated: 9 mL/min — ABNORMAL LOW (ref >60)
Glucose, Bld: 114 mg/dL — ABNORMAL HIGH (ref 70–99)
Potassium: 3.8 mmol/L (ref 3.5–5.1)
Sodium: 126 mmol/L — ABNORMAL LOW (ref 135–145)
Total Bilirubin: 0.7 mg/dL (ref 0.3–1.2)
Total Protein: 7.6 g/dL (ref 6.5–8.1)

## 2022-10-10 LAB — PHOSPHORUS: Phosphorus: 4.4 mg/dL (ref 2.5–4.6)

## 2022-10-10 LAB — RENAL FUNCTION PANEL
Albumin: 2 g/dL — ABNORMAL LOW (ref 3.5–5.0)
Anion gap: 13 (ref 5–15)
BUN: 16 mg/dL (ref 8–23)
CO2: 23 mmol/L (ref 22–32)
Calcium: 8.4 mg/dL — ABNORMAL LOW (ref 8.9–10.3)
Chloride: 94 mmol/L — ABNORMAL LOW (ref 98–111)
Creatinine, Ser: 3.9 mg/dL — ABNORMAL HIGH (ref 0.61–1.24)
GFR, Estimated: 17 mL/min — ABNORMAL LOW (ref >60)
Glucose, Bld: 98 mg/dL (ref 70–99)
Phosphorus: 2.9 mg/dL (ref 2.5–4.6)
Potassium: 3.6 mmol/L (ref 3.5–5.1)
Sodium: 130 mmol/L — ABNORMAL LOW (ref 135–145)

## 2022-10-10 LAB — PROTIME-INR
INR: 1.4 — ABNORMAL HIGH (ref 0.8–1.2)
Prothrombin Time: 17.4 seconds — ABNORMAL HIGH (ref 11.4–15.2)

## 2022-10-10 LAB — HEPATITIS B SURFACE ANTIBODY, QUANTITATIVE: Hep B S AB Quant (Post): 2217 m[IU]/mL

## 2022-10-10 MED ORDER — ANTICOAGULANT SODIUM CITRATE 4% (200MG/5ML) IV SOLN: 5.0000 mL | Status: DC | PRN

## 2022-10-10 MED ORDER — HEPARIN SODIUM (PORCINE) 1000 UNIT/ML IJ SOLN
INTRAMUSCULAR | Status: AC
  Filled 2022-10-10: qty 4

## 2022-10-10 MED ORDER — PENTAFLUOROPROP-TETRAFLUOROETH EX AERO: 1.0000 | INHALATION_SPRAY | CUTANEOUS | Status: DC | PRN

## 2022-10-10 MED ORDER — HEPARIN SODIUM (PORCINE) 1000 UNIT/ML DIALYSIS: 1000.0000 [IU] | INTRAMUSCULAR | Status: DC | PRN

## 2022-10-10 MED ORDER — CHLORHEXIDINE GLUCONATE CLOTH 2 % EX PADS: 6.0000 | MEDICATED_PAD | Freq: Every day | CUTANEOUS | Status: DC

## 2022-10-10 MED ORDER — LIDOCAINE HCL (PF) 1 % IJ SOLN: 5.0000 mL | INTRAMUSCULAR | Status: DC | PRN

## 2022-10-10 MED ORDER — ALTEPLASE 2 MG IJ SOLR: 2.0000 mg | Freq: Once | INTRAMUSCULAR | Status: DC | PRN

## 2022-10-10 MED ORDER — LIDOCAINE-PRILOCAINE 2.5-2.5 % EX CREA: 1.0000 | TOPICAL_CREAM | CUTANEOUS | Status: DC | PRN

## 2022-10-10 NOTE — TOC Initial Note (Signed)
Transition of Care [facility]) - Initial/Assessment Note    Patient Details  Name: Ryan [name] MRN: [id number] Date of Birth: 04/04/1960  [facility] CM/SW Contact:    Lockie Pares, RN Phone Number: 10/10/2022, 10:46 AM  Clinical Narrative:                 Patient is from CIR with new onset atrial fibrillation. He was at [facility] for abx, diskitis endocarditis. He can go back to CIR if this admission is under 3 MN, if not then will have to place PT OT consults to re-evaluate. He was receiving OT PT and SLP for dysphagia.   TOC will continue to monitor for needs, recommendations, and transitions of care  Expected Discharge Plan: IP Rehab Facility Barriers to Discharge: Continued Medical Work up   Patient Goals and CMS Choice            Expected Discharge Plan and Services       Living arrangements for the past 2 months: Single Family Home                                      Prior Living Arrangements/Services Living arrangements for the past 2 months: Single Family Home   Patient language and need for interpreter reviewed:: Yes        Need for Family Participation in Patient Care: Yes (Comment) Care giver support system in place?: Yes (comment)   Criminal Activity/Legal Involvement Pertinent to Current Situation/Hospitalization: No - Comment as needed  Activities of Daily Living Home Assistive Devices/Equipment: Walker (specify type) ADL Screening (condition at time of admission) Patient's cognitive ability adequate to safely complete daily activities?: Yes Is the patient deaf or have difficulty hearing?: No Does the patient have difficulty seeing, even when wearing glasses/contacts?: No Does the patient have difficulty concentrating, remembering, or making decisions?: No Patient able to express need for assistance with ADLs?: Yes Does the patient have difficulty dressing or bathing?: No Independently performs ADLs?: No Walks in Home:  Independent Does the patient have difficulty walking or climbing stairs?: Yes Weakness of Legs: Both Weakness of Arms/Hands: None  Permission Sought/Granted                  Emotional Assessment       Orientation: : Oriented to Self, Oriented to Place Alcohol / Substance Use: Not Applicable Psych Involvement: No (comment)  Admission diagnosis:  Atrial fibrillation with RVR (HCC) [I48.91] Patient Active Problem List   Diagnosis Date Noted   Atrial fibrillation with RVR (HCC) 10/09/2022   Leukocytosis 10/02/2022   Acute osteomyelitis of lumbar spine (HCC) 09/26/2022   Diskitis 09/25/2022   Malnutrition of moderate degree 09/24/2022   MSSA bacteremia 09/08/2022   Discitis of lumbosacral region 09/08/2022   Acute low back pain 09/07/2022   Lung nodules 09/07/2022   Elevated troponin 09/07/2022   SIRS (systemic inflammatory response syndrome) (HCC) 09/07/2022   Hypocalcemia 04/24/2020   COVID-19 01/20/2020   Diverticulitis of colon with bleeding 03/09/2019   Complication of vascular dialysis catheter 12/27/2018   Iron deficiency anemia, unspecified 05/06/2018   Cough 10/30/2017   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   Chronic diastolic heart failure, NYHA class 3 (HCC) 10/18/2017   Hyperlipidemia 09/19/2017   Shortness of breath 09/19/2017   PND (paroxysmal nocturnal dyspnea) 09/14/2017   DOE (dyspnea on exertion) 09/14/2017   Recurrent right  pleural effusion 08/28/2017   ESRD on dialysis (HCC) 08/27/2017   Lesion of vertebra 07/13/2017   Mild protein-calorie malnutrition (HCC) 05/23/2017   Fluid overload, unspecified 05/06/2017   Hyperkalemia 05/01/2017   Personal history of urinary (tract) infections 01/15/2017   Encounter for fitting and adjustment of extracorporeal dialysis catheter (HCC) 09/27/2016   Coagulation defect, unspecified (HCC) 08/28/2016   Kidney transplant failure 08/28/2016   Secondary hyperparathyroidism of renal  origin (HCC) 08/28/2016   Type 2 diabetes mellitus with diabetic chronic kidney disease (HCC) 08/28/2016   Renal transplant, status post 08/26/2016   Hypertension 08/26/2016   Anemia of chronic disease 04/09/2016   EKG abnormalities    Other hydronephrosis 03/04/2016   PCP:  Sharin Grave, MD Pharmacy:   [facility] [phone] [place], [place] - [address] [place] [address] Phone: [phone] Fax: [phone]     Social Determinants of Health (SDOH) Social History: SDOH Screenings   Food Insecurity: No Food Insecurity (10/09/2022)  Housing: Low Risk  (10/09/2022)  Transportation Needs: No Transportation Needs (10/09/2022)  Utilities: Not At Risk (10/09/2022)  Financial Resource Strain: Medium Risk (05/16/2022)   Received from [facility], [facility]  Tobacco Use: Low Risk  (10/09/2022)   SDOH Interventions:     Readmission Risk Interventions    09/10/2022    1:53 PM  Readmission Risk Prevention Plan  Transportation Screening Complete  PCP or Specialist Appt within 5-7 Days Complete  Home Care Screening Complete  Medication Review (RN CM) Referral to Pharmacy

## 2022-10-10 NOTE — Procedures (Signed)
Patient was seen on dialysis and the procedure was supervised.  BFR 400  Via TDC BP is  117/81.   Patient appears to be tolerating treatment well  Malary Aylesworth Jaynie Collins 10/10/2022

## 2022-10-10 NOTE — Progress Notes (Signed)
[facility] Progress Note   Subjective:   Pt went into tachycardia on HD yesterday and HD was terminated early. Was given IV lopressor.  Seen by cardiology who reported it was sinus tachy with 1st degree AV block.  Seen in room, reports he is feeling better. Reports he had some SOB when working with PT, thinks he was holding his breath. Denies CP, palpitations, dizziness and nausea. HD later today.   Objective Vitals:   10/10/22 0305 10/10/22 0430 10/10/22 0759 10/10/22 1311  BP: 132/82 122/74 128/83 117/80  Pulse: (!) 106 (!) 110 (!) 109 (!) 108  Resp: (!) 24 20 19  (!) 25  Temp: 98.3 F (36.8 C)  98.4 F (36.9 C) 97.7 F (36.5 C)  TempSrc: Oral  Oral   SpO2: 95% 93% 96% 98%  Weight:      Height:       Physical Exam General: Well appearing male in NAD Heart: RRR, no murmurs, rubs or gallops Lungs: CTA bilaterally Abdomen: Soft, non-distended, +BS Extremities: No edema b/l lower extremities Dialysis Access: [facility]  Additional Objective Labs: Basic Metabolic Panel: Recent Labs  Lab 10/06/22 0637 10/08/22 0605 10/09/22 1138 10/09/22 1802 10/09/22 1805  NA 129* 127* 124*  --  128*  K 3.8 3.7 3.9  --  3.8  CL 92* 92* 88*  --  89*  CO2 24 25 24   --  24  GLUCOSE 100* 108* 115*  --  122*  BUN 41* 30* 40*  --  25*  CREATININE 7.89* 6.04* 7.42* 5.27* 5.13*  CALCIUM 8.2* 7.8* 7.9*  --  8.2*  PHOS 4.1  --  3.8  --  3.2   Liver Function Tests: Recent Labs  Lab 10/06/22 0637 10/09/22 1138 10/09/22 1805  ALBUMIN 1.6* 1.9* 2.0*   No results for input(s): "LIPASE", "AMYLASE" in the last 168 hours. CBC: Recent Labs  Lab 10/05/22 2023 10/06/22 0637 10/07/22 0548 10/08/22 0605 10/09/22 1138 10/09/22 1802  WBC 14.7* 13.8* 12.9* 13.7* 14.3* 13.9*  NEUTROABS 12.3* 11.1* 10.3*  --   --   --   HGB 7.0* 6.3* 6.1* 7.0* 7.7* 8.3*  HCT 22.8* 21.3* 20.0* 22.6* 24.8* 26.4*  MCV 83.2 83.5 82.6 81.3 81.8 82.8  PLT 344 321 358 349 377 407*   Blood Culture     Component Value Date/Time   SDES BLOOD BLOOD RIGHT ARM 10/05/2022 2032   SPECREQUEST  10/05/2022 2032    BOTTLES DRAWN AEROBIC AND ANAEROBIC Blood Culture adequate volume   CULT  10/05/2022 2032    NO GROWTH 5 DAYS Performed at [facility], [address] [place], [place] [address]    REPTSTATUS 10/10/2022 FINAL 10/05/2022 2032    Cardiac Enzymes: No results for input(s): "CKTOTAL", "CKMB", "CKMBINDEX", "TROPONINI" in the last 168 hours. CBG: No results for input(s): "GLUCAP" in the last 168 hours. Iron Studies: No results for input(s): "IRON", "TIBC", "TRANSFERRIN", "FERRITIN" in the last 72 hours. @lablastinr3 @ Studies/Results: DG Swallowing Func-Speech Pathology  Result Date: 10/09/2022 Table formatting from the original result was not included. Modified Barium Swallow Study Patient Details Name: Ryan [name] MRN: [id number] Date of Birth: [birth year] Today's Date: 10/09/2022 HPI/PMH: HPI: Pt is a 62 y/o male admitted secondary to acute back pain and found to have L5-S1 discitis/osteomyelitis, SBO and MSSA bacteremia likely 2/2 line infection. Pt had HD cath replaced with rt femoral temp cath. 09/22/22 Hemodialysis Catheter Left Subclavian Double lumen Permanent (Tunneled). 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  PMH including but not limited to hypertension, hyperlipidemia, ESRD. Clinical Impression: Clinical Impression: Overall, pt presented w/ a safe and efficient oropharyngeal swallow. With large sips, oral residue w/ subsequent freespill to BOT and valleculae was noted. However, pt demonstrated excellent sensation of this residue and was consistently able to trigger the additional swallow as needed. Otherwise, adequate pharyngeal clearance noted and no airway invasion was evident throughout trials. Cough x2 noted overall, though unrelated to airway invasion. Would recommend continued diet of regular textures and thin  liquids, however, pt requested Dys 3 textures. Continue with meds whole via liquids. No dypshagia intervention required, conitnue w/ current POC for cognitive tx. Factors that may increase risk of adverse event in presence of aspiration [name] & [name] 2021): Factors that may increase risk of adverse event in presence of aspiration [name] & [name] 2021): Poor general health and/or compromised immunity; Respiratory or GI disease Recommendations/Plan: Swallowing Evaluation Recommendations Swallowing Evaluation Recommendations Recommendations: PO diet PO Diet Recommendation: Regular; Thin liquids (Level 0) Liquid Administration via: Straw; Cup Medication Administration: Whole meds with liquid Supervision: Patient able to self-feed Swallowing strategies  : Minimize environmental distractions; Small bites/sips; Slow rate Postural changes: Stay upright 30-60 min after meals; Position pt fully upright for meals Oral care recommendations: Oral care BID (2x/day); Pt independent with oral care Treatment Plan Treatment Plan Treatment recommendations: Therapy as outlined in treatment plan below Recommendations Comment: no dysphagia intervention required, conitnue w/ current POC for cognition Treatment frequency: Min 1x/week Treatment duration: 1 week Interventions: Other (comment) (no dysphagia intervention - continue w/ cog tx only) Recommendations Recommendations for follow up therapy are one component of a multi-disciplinary discharge planning process, led by the attending physician.  Recommendations may be updated based on patient status, additional functional criteria and insurance authorization. Assessment: Orofacial Exam: Orofacial Exam Oral Cavity: Oral Hygiene: WFL Oral Cavity - Dentition: Adequate natural dentition Orofacial Anatomy: WFL Oral Motor/Sensory Function: WFL Anatomy: Anatomy: WFL Boluses Administered: Boluses Administered Boluses Administered: Thin liquids (Level 0); Mildly thick liquids (Level 2,  nectar thick); Puree; Solid  Oral Impairment Domain: Oral Impairment Domain Lip Closure: No labial escape Tongue control during bolus hold: Cohesive bolus between tongue to palatal seal Bolus preparation/mastication: Timely and efficient chewing and mashing Bolus transport/lingual motion: Brisk tongue motion Oral residue: Trace residue lining oral structures Location of oral residue : Tongue Initiation of pharyngeal swallow : Pyriform sinuses  Pharyngeal Impairment Domain: Pharyngeal Impairment Domain Soft palate elevation: No bolus between soft palate (SP)/pharyngeal wall (PW) Laryngeal elevation: Complete superior movement of thyroid cartilage with complete approximation of arytenoids to epiglottic petiole Anterior hyoid excursion: Complete anterior movement Epiglottic movement: Complete inversion Laryngeal vestibule closure: Complete, no air/contrast in laryngeal vestibule Pharyngeal stripping wave : Present - complete Pharyngeal contraction (A/P view only): N/A Pharyngoesophageal segment opening: Complete distension and complete duration, no obstruction of flow Tongue base retraction: No contrast between tongue base and posterior pharyngeal wall (PPW) Pharyngeal residue: Trace residue within or on pharyngeal structures Location of pharyngeal residue: Valleculae; Tongue base  Esophageal Impairment Domain: Esophageal Impairment Domain Esophageal clearance upright position: Esophageal retention Pill: No data recorded Penetration/Aspiration Scale Score: Penetration/Aspiration Scale Score 1.  Material does not enter airway: Thin liquids (Level 0); Mildly thick liquids (Level 2, nectar thick); Puree; Solid Compensatory Strategies: Compensatory Strategies Compensatory strategies: Yes Straw: Effective Effective Straw: Thin liquid (Level 0); Mildly thick liquid (Level 2, nectar thick) Multiple swallows: Effective Effective Multiple Swallows: Thin liquid (Level 0);  Mildly thick liquid (Level 2, nectar thick)   General  Information: Caregiver present: No  Diet Prior to this Study: Regular; Thin liquids (Level 0)   Temperature : Normal   Respiratory Status: WFL   Supplemental O2: Nasal cannula (3L O2)   History of Recent Intubation: No  Behavior/Cognition: Alert; Cooperative; Pleasant mood Self-Feeding Abilities: Able to self-feed Baseline vocal quality/speech: Normal Volitional Cough: Able to elicit Volitional Swallow: Able to elicit Exam Limitations: No limitations Goal Planning: No data recorded No data recorded No data recorded No data recorded Consulted and agree with results and recommendations: Patient; Nurse; Physician Pain: Pain Assessment Pain Assessment: No/denies pain Pain Score: 0 End of Session: Start Time:No data recorded Stop Time: No data recorded Time Calculation:No data recorded Charges: No data recorded SLP visit diagnosis: No data recorded Past Medical History: Past Medical History: Diagnosis Date  Allergy   Anemia   Blood transfusion without reported diagnosis   Dialysis patient (HCC)   Tues,thurs,sat  ESRD (end stage renal disease) (HCC)   TTHS-   Family history of adverse reaction to anesthesia   father had allergic reaction with anesthesia with a dental procedure-(pt. doesn't know)  Hyperlipidemia   Hypertension  Past Surgical History: Past Surgical History: Procedure Laterality Date  A/V FISTULAGRAM Left 12/15/2016  Procedure: A/V Fistulagram - left;  Surgeon: Chuck Hint, MD;  Location: [facility] INVASIVE CV LAB;  Service: Cardiovascular;  Laterality: Left;  AV FISTULA PLACEMENT Left 08/28/2016  Procedure: LEFT UPPER  ARM ARTERIOVENOUS (AV) FISTULA CREATION;  Surgeon: Chuck Hint, MD;  Location: [facility] OR;  Service: Vascular;  Laterality: Left;  DIALYSIS/PERMA CATHETER INSERTION Right 12/21/2017  Procedure: INSERTION OF DIALYSIS CATHETER Right Internal Jugular .;  Surgeon: Sherren Kerns, MD;  Location: [facility] OR;  Service: Vascular;  Laterality: Right;  FISTULA SUPERFICIALIZATION Left 09/23/2017   Procedure: FISTULA PLICATION LEFT ARM;  Surgeon: Sherren Kerns, MD;  Location: [facility] OR;  Service: Vascular;  Laterality: Left;  FISTULOGRAM Left 12/21/2017  Procedure: FISTULOGRAM with Balloon Angioplasty.;  Surgeon: Sherren Kerns, MD;  Location: [facility] OR;  Service: Vascular;  Laterality: Left;  HEMATOMA EVACUATION Left 08/27/2020  Procedure: EVACUATION HEMATOMA LEFT ARM;  Surgeon: Sherren Kerns, MD;  Location: [facility] OR;  Service: Vascular;  Laterality: Left;  IR FLUORO GUIDE CV LINE LEFT  09/22/2022  IR FLUORO GUIDE CV LINE RIGHT  09/12/2022  IR FLUORO GUIDE CV LINE RIGHT  09/17/2022  IR REMOVAL TUN CV CATH W/O FL  09/09/2022  IR THORACENTESIS ASP PLEURAL SPACE W/IMG GUIDE  08/22/2017  1.2 L -right-sided  IR THORACENTESIS ASP PLEURAL SPACE W/IMG GUIDE  10/19/2017  IR US GUIDE VASC ACCESS LEFT  09/22/2022  IR US GUIDE VASC ACCESS RIGHT  09/12/2022  KIDNEY TRANSPLANT    REVISON OF ARTERIOVENOUS FISTULA Left 12/21/2017  Procedure: PLICATION and Ligation of F LEFT ARM ARTERIOVENOUS FISTULA;  Surgeon: Sherren Kerns, MD;  Location: [facility] OR;  Service: Vascular;  Laterality: Left;  REVISON OF ARTERIOVENOUS FISTULA Left 07/16/2020  Procedure: EXCISION OF LEFT ARM ARTERIOVENOUS FISTULA;  Surgeon: Sherren Kerns, MD;  Location: [facility] OR;  Service: Vascular;  Laterality: Left;  stent in kidneys    dec 2017  TRANSTHORACIC ECHOCARDIOGRAM  09/22/2017   Severe LVH.  Normal function -EF 55-60%.  GRII DD.  Moderate RV dilation with mildly reduced RV function.   Fixed right coronary cusp with very mild aortic stenosis.  The myocardium has a speckled appearance --> .   Recommend cardiac MRI to  evaluate for amyloid  UPPER EXTREMITY VENOGRAPHY Bilateral 06/01/2020  Procedure: UPPER EXTREMITY VENOGRAPHY;  Surgeon: Chuck Hint, MD;  Location: [facility] INVASIVE CV LAB;  Service: Cardiovascular;  Laterality: Bilateral; Pati Gallo 10/09/2022, 10:11 AM  DG CHEST PORT 1 VIEW  Result Date: 10/09/2022 CLINICAL DATA:  Recent pneumonia,  low O2 sats with shortness of breath. EXAM: PORTABLE CHEST 1 VIEW COMPARISON:  Chest radiograph 10/05/2022 FINDINGS: The left-sided vascular catheter is stable with tip terminating in the right atrium. The heart is enlarged, unchanged. The upper mediastinal contours are prominent, also unchanged Lung volumes are low. There is worsened opacity in the right midlung. There is probable mild bibasilar subsegmental atelectasis. There is vascular congestion and probable mild pulmonary interstitial edema, slightly worsened. There is no significant pleural effusion there is no pneumothorax. There is no acute osseous abnormality. IMPRESSION: Cardiomegaly with vascular congestion and probable mild pulmonary interstitial edema, slightly worsened. More focal airspace opacity in the right midlung could reflect superimposed infection in the correct clinical setting. Electronically Signed   By: Lesia Hausen M.D.   On: 10/09/2022 08:44   Medications:  anticoagulant sodium citrate     ceFAZolin Stopped (10/09/22 1842)    calcitRIOL  0.5 mcg Oral Q T,Th,Sat-1800   Chlorhexidine Gluconate Cloth  6 each Topical Q0600   Darbepoetin Alfa  150 mcg Subcutaneous Q Thu-1800   feeding supplement (NEPRO CARB STEADY)  237 mL Oral BID BM   heparin  5,000 Units Subcutaneous Q8H   methocarbamol  250 mg Oral BID   midodrine  10 mg Oral TID WC   pantoprazole  40 mg Oral Daily    Outpatient Dialysis Orders: [facility]  3:45 450/A1.5x 2K/2Ca EDW 85.7kg TDC  -Heparin 3000 units IV TIW  -No ESA -Calcitriol 1.5 MCG PO three times per week  Assessment/Plan: 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 ID RX  Blood Cx 6/16 MSSA, negative Cx on 6/17, 6/19. TDC removed 6/18. Completed 72 hour line holiday. TTE concerning for endocarditis. TEE cancelled d/t high risk factors. LS MRI with possible L5-S1 discitis. Temp cath placed 6/21, but poor function requiring exchange -  Will be getting IV Cefazolin 2g q  HD x 6 weeks (until 11/04/22). 2. Deconditioning [name]: in CIR, was transferred back to 6E due to sinus tachycardia 3. Anemia - Hgb 8.3. +FOBT, GI consulting. Hold heparin with HD. Per primary service. Aranesp q Thursday, increased next dose to .   4. Hyponatremia: managing with HD and UF 5. ESRD: Usual TTS schedule - then line issues preventing HD 6/27 -7/1. HD terminated early Thursday, HD today then again tomorrow as tolerated. RIJ TDC removed 6/18 - s/p line holiday, then temp line placed 6/21 with recurrent non-function issues despite exchange 6/26. S/p [facility] 7/1 in IR.  6. Perm Access: S/p L AVF ligation in 2019. Last seen by Dr. Edilia Bo 01/2022 - limited mobility in R arm so planning for L arm graft but had not scheduled surgery yet. Can revisit as an outpatient once bacteremia resolved.  7. Hypotension/volume: on midodrine. UF as tolerated.  SOB likely volume mediated bsed on CXR. When into sinus tachycardia yesterday during UF challenge. UF with HD today as tolerated, limited by tachycardia. Getting under edw, will need lower EDW on discharge.   8. 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 Binder changed to Velphoro now at goal. Increased dose to 2 tabs PO TID AC.   9. Nutrition - Renal diet, Alb low - continue supplements  Rogers Blocker, PA-C 10/10/2022, 1:27 PM  [facility] Pager: [phone]

## 2022-10-10 NOTE — Progress Notes (Signed)
PROGRESS NOTE    ZAVEN KLEMENS  ONG:[id number] DOB: 1960/05/15 DOA: 10/09/2022 PCP: Sharin Grave, MD    Brief Narrative:  This 62 y.o. male with medical history significant of HTN, HLD, ESRD on hemodialysis being admitted from CIR for concern about new onset atrial fibrillation with RVR.   Patient initially presented to the hospital on 6/6 with lower back pain and ultimately found to have evidence of L5/S1 discitis and osteomyelitis as well as MSSA bacteremia with mitral valve endocarditis. CT scan of the chest noted septic emboli with new airspace opacities in the left upper lobe . MRI brain noting scattered subcentimeter infarcts involving the bilateral cerebral hemisphere and right cerebellum. CT surgery has been consulted but recommended conservative management as. He was already responding to antibiotics. Plan was for patient to continue on antibiotics at cefazolin until 8/13 per ID recommendations. Hospital course had also been complicated by small bowel obstruction which has since resolved. Patient had been discharged to inpatient rehab on 7/4.    While in rehab medicine team was consulted on 7/11 for cough and dyspnea.  Eventually patient was diagnosed with concerns of pneumonia/aspiration therefore was on 5 days of Zosyn and also diagnosed of fluid overload being managed with hemodialysis.  He also reported of dysphagia and had drop in hemoglobin of 6.1 with Hemoccult positive stool.  Patient was seen by GI, esophagram performed which was negative for any acute pathology.   While getting hemodialysis on 7/18 patient went into atrial fibrillation with RVR therefore being admitted to the hospital for closer monitoring.   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  It was thought as New onset Atrial Fibrillation. Cardiology consulted. Continue IV as needed Lopressor for now.   No need for Anticoagulation.   Acute respiratory distress: - Combination of fluid overload and recovering from pneumonia -Completing course of Zosyn, will transition back to cefazolin.  Last day of cefazolin is 8/13.   - Continue As needed bronchodilators, I-S/flutter valve.   Acute diastolic congestive heart failure, class III: -Recent echocardiogram showed EF of 60%.  Patient does not produce any urine.   -Volume status will be managed by hemodialysis.  If he does well, possible another session tomorrow.   Dysphagia - Have esophagram which was negative.  Speech and swallow recommending D3 diet.   Anemia of chronic disease - Baseline hemoglobin 8.0 which has drifted down to 6.1 requiring 1 unit PRBC transfusion on 7/15.   Continue PPI.  LB GI following.   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  Plan the last day 8/13     DVT prophylaxis: Heparin sq Code Status:Full code Family Communication:No family Disposition Plan:    Status is: Inpatient Remains inpatient appropriate because: Continued inpatient workup.     Consultants:  Cardiology Nephrology  Procedures:  Antimicrobials: Anti-infectives (From admission, onward)    Start     Dose/Rate Route Frequency Ordered Stop   10/09/22 1800  ceFAZolin (ANCEF) IVPB 2g/100 mL premix        2 g 200 mL/hr  over 30 Minutes Intravenous Every T-Th-Sa (1800) 10/09/22 1619         Subjective: Patient was seen and examined at bed side, denies any palpitations,chest pain or dizziness. Serum sodium still remains low.  Objective: Vitals:   10/10/22 1311  10/10/22 1404 10/10/22 1431 10/10/22 1501  BP: 117/80 117/81 124/81 114/82  Pulse: (!) 108  (!) 105 (!) 106  Resp: (!) 25 (!) 29 (!) 25 (!) 23  Temp: 97.7 F (36.5 C)     TempSrc: Oral     SpO2: 98%  100% 98%  Weight:      Height:        Intake/Output Summary (Last 24 hours) at 10/10/2022 1525 Last data filed at 10/10/2022 0333 Gross per 24 hour  Intake 700.07 ml  Output --  Net 700.07 ml   Filed Weights   10/09/22 1608  Weight: 81 kg    Examination:  General exam: Appears calm and comfortable. Deconditioned.  Respiratory system: Clear to auscultation. Respiratory effort normal. RR 16 Cardiovascular system: S1 & S2 heard, RRR. No JVD, murmurs, rubs, gallops or clicks.  Gastrointestinal system: Abdomen is nondistended, soft and nontender. Normal bowel sounds heard. Central nervous system: Alert and oriented x3. No focal neurological deficits. Extremities: Symmetric 5 x 5 power. Skin: No rashes, lesions or ulcers Psychiatry: Judgement and insight appear normal. Mood & affect appropriate.     Data Reviewed: I have personally reviewed following labs and imaging studies  CBC: Recent Labs  Lab 10/05/22 2023 10/06/22 0637 10/07/22 0548 10/08/22 0605 10/09/22 1138 10/09/22 1802 10/10/22 1300  WBC 14.7* 13.8* 12.9* 13.7* 14.3* 13.9* 14.0*  NEUTROABS 12.3* 11.1* 10.3*  --   --   --   --   HGB 7.0* 6.3* 6.1* 7.0* 7.7* 8.3* 7.9*  HCT 22.8* 21.3* 20.0* 22.6* 24.8* 26.4* 25.8*  MCV 83.2 83.5 82.6 81.3 81.8 82.8 83.0  PLT 344 321 358 349 377 407* 363   Basic Metabolic Panel: Recent Labs  Lab 10/05/22 0531 10/05/22 2023 10/06/22 0637 10/08/22 0605 10/09/22 1138 10/09/22 1802 10/09/22 1805 10/10/22 1300  NA 130*   < > 129* 127* 124*  --  128* 126*  K 3.6   < > 3.8 3.7 3.9  --  3.8 3.8  CL 94*   < > 92* 92* 88*  --  89* 90*  CO2 26   < > 24 25 24   --  24 23  GLUCOSE 106*   < > 100* 108* 115*  --  122* 114*  BUN 27*   < > 41* 30* 40*  --  25* 32*  CREATININE 5.79*    < > 7.89* 6.04* 7.42* 5.27* 5.13* 6.50*  CALCIUM 8.2*   < > 8.2* 7.8* 7.9*  --  8.2* 8.3*  PHOS 3.9  --  4.1  --  3.8  --  3.2 4.4   < > = values in this interval not displayed.   GFR: Estimated Creatinine Clearance: 11.9 mL/min (A) (by C-G formula based on SCr of 6.5 mg/dL (H)). Liver Function Tests: Recent Labs  Lab 10/05/22 0531 10/06/22 1478 10/09/22 1138 10/09/22 1805 10/10/22 1300  AST  --   --   --   --  41  ALT  --   --   --   --  7  ALKPHOS  --   --   --   --  107  BILITOT  --   --   --   --  0.7  PROT  --   --   --   --  7.6  ALBUMIN 1.8* 1.6* 1.9* 2.0* 2.0*   No results for input(s): "LIPASE", "AMYLASE" in the last 168 hours. No results for input(s): "AMMONIA" in the last 168 hours. Coagulation Profile: No results for input(s): "INR", "PROTIME" in the last 168 hours. Cardiac Enzymes: No results for input(s): "CKTOTAL", "CKMB", "CKMBINDEX", "TROPONINI" in the last 168 hours. BNP (last 3 results) No results for input(s): "PROBNP" in the last 8760 hours. HbA1C: No results for input(s): "HGBA1C" in the last 72 hours. CBG: No results for input(s): "GLUCAP" in the last 168 hours. Lipid Profile: No results for input(s): "CHOL", "HDL", "LDLCALC", "TRIG", "CHOLHDL", "LDLDIRECT" in the last 72 hours. Thyroid Function Tests: Recent Labs    10/09/22 1802  TSH 8.976*   Anemia Panel: No results for input(s): "VITAMINB12", "FOLATE", "FERRITIN", "TIBC", "IRON", "RETICCTPCT" in the last 72 hours. Sepsis Labs: Recent Labs  Lab 10/05/22 2023 10/05/22 2257  PROCALCITON 5.04  --   LATICACIDVEN 1.3 1.0    Recent Results (from the past 240 hour(s))  Culture, blood (Routine X 2) w Reflex to ID Panel     Status: None   Collection Time: 10/05/22  8:23 PM   Specimen: BLOOD  Result Value Ref Range Status   Specimen Description BLOOD BLOOD RIGHT HAND  Final   Special Requests   Final    BOTTLES DRAWN AEROBIC AND ANAEROBIC Blood Culture adequate volume   Culture   Final     NO GROWTH 5 DAYS Performed at [facility] Lab, [address] [place], [place] [address]    Report Status 10/10/2022 FINAL  Final  Culture, blood (Routine X 2) w Reflex to ID Panel     Status: None   Collection Time: 10/05/22  8:32 PM   Specimen: BLOOD  Result Value Ref Range Status   Specimen Description BLOOD BLOOD RIGHT ARM  Final   Special Requests   Final    BOTTLES DRAWN AEROBIC AND ANAEROBIC Blood Culture adequate volume   Culture   Final    NO GROWTH 5 DAYS Performed at [facility], [address], [place], [place] [address]    Report Status 10/10/2022 FINAL  Final  Respiratory (~20 pathogens) panel by PCR     Status: None   Collection Time: 10/06/22  1:58 AM   Specimen: Nasopharyngeal Swab; Respiratory  Result Value Ref Range Status   Adenovirus NOT DETECTED NOT DETECTED Final   Coronavirus 229E NOT DETECTED NOT DETECTED Final    Comment: (NOTE) The Coronavirus on the Respiratory Panel, DOES NOT test for the novel  Coronavirus (2019 nCoV)    Coronavirus HKU1 NOT DETECTED NOT DETECTED Final   Coronavirus NL63 NOT DETECTED NOT DETECTED Final   Coronavirus OC43 NOT DETECTED NOT DETECTED Final   Metapneumovirus NOT DETECTED NOT DETECTED Final   Rhinovirus / Enterovirus NOT DETECTED NOT DETECTED Final   Influenza A NOT DETECTED NOT DETECTED Final   Influenza B NOT DETECTED NOT DETECTED Final   Parainfluenza Virus 1 NOT DETECTED NOT DETECTED Final   Parainfluenza Virus 2 NOT DETECTED NOT DETECTED Final   Parainfluenza Virus 3 NOT DETECTED NOT DETECTED Final   Parainfluenza Virus 4 NOT DETECTED NOT DETECTED Final   Respiratory Syncytial Virus NOT DETECTED NOT DETECTED Final   Bordetella pertussis NOT DETECTED NOT DETECTED Final   Bordetella Parapertussis NOT DETECTED NOT DETECTED Final   Chlamydophila pneumoniae NOT DETECTED NOT DETECTED Final  Mycoplasma pneumoniae NOT DETECTED NOT DETECTED Final    Comment: Performed at [facility], [address], [place], [place] [address]  SARS Coronavirus 2 by RT PCR (hospital order, performed in [facility] hospital lab) *cepheid single result test* Anterior Nasal Swab     Status: None   Collection Time: 10/06/22  1:58 AM   Specimen: Anterior Nasal Swab  Result Value Ref Range Status   SARS Coronavirus 2 by RT PCR NEGATIVE NEGATIVE Final    Comment: Performed at [facility] Lab, [address] [place], [place] [address]         Radiology Studies: DG Swallowing Func-Speech Pathology  Result Date: 10/09/2022 Table formatting from the original result was not included. Modified Barium Swallow Study Patient Details Name: CAMDYN BESKE MRN: [id number] Date of Birth: 1960/08/24 Today's Date: 10/09/2022 HPI/PMH: HPI: Pt is a 62 y/o male admitted secondary to acute back pain and found to have L5-S1 discitis/osteomyelitis, SBO and MSSA bacteremia likely 2/2 line infection. Pt had HD cath replaced with rt femoral temp cath. 09/22/22 Hemodialysis Catheter Left Subclavian Double lumen Permanent (Tunneled). 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  PMH including but not limited to hypertension, hyperlipidemia, ESRD. Clinical Impression: Clinical Impression: Overall, pt presented w/ a safe and efficient oropharyngeal swallow. With large sips, oral residue w/ subsequent freespill to BOT and valleculae was noted. However, pt demonstrated excellent sensation of this residue and was consistently able to trigger the additional swallow as needed. Otherwise, adequate pharyngeal clearance noted and no airway invasion was evident throughout trials. Cough x2 noted overall, though unrelated to airway invasion. Would recommend continued diet of regular textures and thin liquids, however, pt requested Dys 3 textures. Continue with meds whole via liquids. No dypshagia intervention required, conitnue w/ current POC for cognitive tx. Factors that may increase risk of adverse  event in presence of aspiration [name] & [name] 2021): Factors that may increase risk of adverse event in presence of aspiration [name] & [name] 2021): Poor general health and/or compromised immunity; Respiratory or GI disease Recommendations/Plan: Swallowing Evaluation Recommendations Swallowing Evaluation Recommendations Recommendations: PO diet PO Diet Recommendation: Regular; Thin liquids (Level 0) Liquid Administration via: Straw; Cup Medication Administration: Whole meds with liquid Supervision: Patient able to self-feed Swallowing strategies  : Minimize environmental distractions; Small bites/sips; Slow rate Postural changes: Stay upright 30-60 min after meals; Position pt fully upright for meals Oral care recommendations: Oral care BID (2x/day); Pt independent with oral care Treatment Plan Treatment Plan Treatment recommendations: Therapy as outlined in treatment plan below Recommendations Comment: no dysphagia intervention required, conitnue w/ current POC for cognition Treatment frequency: Min 1x/week Treatment duration: 1 week Interventions: Other (comment) (no dysphagia intervention - continue w/ cog tx only) Recommendations Recommendations for follow up therapy are one component of a multi-disciplinary discharge planning process, led by the attending physician.  Recommendations may be updated based on patient status, additional functional criteria and insurance authorization. Assessment: Orofacial Exam: Orofacial Exam Oral Cavity: Oral Hygiene: WFL Oral Cavity - Dentition: Adequate natural dentition Orofacial Anatomy: WFL Oral Motor/Sensory Function: WFL Anatomy: Anatomy: WFL Boluses Administered: Boluses Administered Boluses Administered: Thin liquids (Level 0); Mildly thick liquids (Level 2, nectar thick); Puree; Solid  Oral Impairment Domain: Oral Impairment Domain Lip Closure: No labial escape Tongue control during bolus hold: Cohesive bolus between tongue to palatal seal Bolus  preparation/mastication: Timely and efficient chewing and mashing Bolus transport/lingual motion: Brisk tongue  motion Oral residue: Trace residue lining oral structures Location of oral residue : Tongue Initiation of pharyngeal swallow : Pyriform sinuses  Pharyngeal Impairment Domain: Pharyngeal Impairment Domain Soft palate elevation: No bolus between soft palate (SP)/pharyngeal wall (PW) Laryngeal elevation: Complete superior movement of thyroid cartilage with complete approximation of arytenoids to epiglottic petiole Anterior hyoid excursion: Complete anterior movement Epiglottic movement: Complete inversion Laryngeal vestibule closure: Complete, no air/contrast in laryngeal vestibule Pharyngeal stripping wave : Present - complete Pharyngeal contraction (A/P view only): N/A Pharyngoesophageal segment opening: Complete distension and complete duration, no obstruction of flow Tongue base retraction: No contrast between tongue base and posterior pharyngeal wall (PPW) Pharyngeal residue: Trace residue within or on pharyngeal structures Location of pharyngeal residue: Valleculae; Tongue base  Esophageal Impairment Domain: Esophageal Impairment Domain Esophageal clearance upright position: Esophageal retention Pill: No data recorded Penetration/Aspiration Scale Score: Penetration/Aspiration Scale Score 1.  Material does not enter airway: Thin liquids (Level 0); Mildly thick liquids (Level 2, nectar thick); Puree; Solid Compensatory Strategies: Compensatory Strategies Compensatory strategies: Yes Straw: Effective Effective Straw: Thin liquid (Level 0); Mildly thick liquid (Level 2, nectar thick) Multiple swallows: Effective Effective Multiple Swallows: Thin liquid (Level 0); Mildly thick liquid (Level 2, nectar thick)   General Information: Caregiver present: No  Diet Prior to this Study: Regular; Thin liquids (Level 0)   Temperature : Normal   Respiratory Status: WFL   Supplemental O2: Nasal cannula (3L O2)   History  of Recent Intubation: No  Behavior/Cognition: Alert; Cooperative; Pleasant mood Self-Feeding Abilities: Able to self-feed Baseline vocal quality/speech: Normal Volitional Cough: Able to elicit Volitional Swallow: Able to elicit Exam Limitations: No limitations Goal Planning: No data recorded No data recorded No data recorded No data recorded Consulted and agree with results and recommendations: Patient; Nurse; Physician Pain: Pain Assessment Pain Assessment: No/denies pain Pain Score: 0 End of Session: Start Time:No data recorded Stop Time: No data recorded Time Calculation:No data recorded Charges: No data recorded SLP visit diagnosis: No data recorded Past Medical History: Past Medical History: Diagnosis Date  Allergy   Anemia   Blood transfusion without reported diagnosis   Dialysis patient (HCC)   Tues,thurs,sat  ESRD (end stage renal disease) (HCC)   TTHS-   Family history of adverse reaction to anesthesia   father had allergic reaction with anesthesia with a dental procedure-(pt. doesn't know)  Hyperlipidemia   Hypertension  Past Surgical History: Past Surgical History: Procedure Laterality Date  A/V FISTULAGRAM Left 12/15/2016  Procedure: A/V Fistulagram - left;  Surgeon: Chuck Hint, MD;  Location[facility] INVASIVE CV LAB;  Service: Cardiovascular;  Laterality: Left;  AV FISTULA PLACEMENT Left 08/28/2016  Procedure: LEFT UPPER  ARM ARTERIOVENOUS (AV) FISTULA CREATION;  Surgeon: Chuck Hint, MD;  Location: [facility] OR;  Service: Vascular;  Laterality: Left;  DIALYSIS/PERMA CATHETER INSERTION Right 12/21/2017  Procedure: INSERTION OF DIALYSIS CATHETER Right Internal Jugular .;  Surgeon: Sherren Kerns, MD;  Location: [facility] OR;  Service: Vascular;  Laterality: Right;  FISTULA SUPERFICIALIZATION Left 09/23/2017  Procedure: FISTULA PLICATION LEFT ARM;  Surgeon: Sherren Kerns, MD;  Location: [facility] OR;  Service: Vascular;  Laterality: Left;  FISTULOGRAM Left 12/21/2017  Procedure: FISTULOGRAM with  Balloon Angioplasty.;  Surgeon: Sherren Kerns, MD;  Location: [facility] OR;  Service: Vascular;  Laterality: Left;  HEMATOMA EVACUATION Left 08/27/2020  Procedure: EVACUATION HEMATOMA LEFT ARM;  Surgeon: Sherren Kerns, MD;  Location: [facility] OR;  Service: Vascular;  Laterality: Left;  IR FLUORO GUIDE CV LINE LEFT  09/22/2022  IR FLUORO GUIDE CV LINE RIGHT  09/12/2022  IR FLUORO GUIDE CV LINE RIGHT  09/17/2022  IR REMOVAL TUN CV CATH W/O FL  09/09/2022  IR THORACENTESIS ASP PLEURAL SPACE W/IMG GUIDE  08/22/2017  1.2 L -right-sided  IR THORACENTESIS ASP PLEURAL SPACE W/IMG GUIDE  10/19/2017  IR US GUIDE VASC ACCESS LEFT  09/22/2022  IR US GUIDE VASC ACCESS RIGHT  09/12/2022  KIDNEY TRANSPLANT    REVISON OF ARTERIOVENOUS FISTULA Left 12/21/2017  Procedure: PLICATION and Ligation of F LEFT ARM ARTERIOVENOUS FISTULA;  Surgeon: Sherren Kerns, MD;  Location: [facility] OR;  Service: Vascular;  Laterality: Left;  REVISON OF ARTERIOVENOUS FISTULA Left 07/16/2020  Procedure: EXCISION OF LEFT ARM ARTERIOVENOUS FISTULA;  Surgeon: Sherren Kerns, MD;  Location: [facility] OR;  Service: Vascular;  Laterality: Left;  stent in kidneys    dec 2017  TRANSTHORACIC ECHOCARDIOGRAM  09/22/2017   Severe LVH.  Normal function -EF 55-60%.  GRII DD.  Moderate RV dilation with mildly reduced RV function.   Fixed right coronary cusp with very mild aortic stenosis.  The myocardium has a speckled appearance --> .   Recommend cardiac MRI to evaluate for amyloid  UPPER EXTREMITY VENOGRAPHY Bilateral 06/01/2020  Procedure: UPPER EXTREMITY VENOGRAPHY;  Surgeon: Chuck Hint, MD;  Location: [facility] INVASIVE CV LAB;  Service: Cardiovascular;  Laterality: Bilateral; Pati Gallo 10/09/2022, 10:11 AM  DG CHEST PORT 1 VIEW  Result Date: 10/09/2022 CLINICAL DATA:  Recent pneumonia, low O2 sats with shortness of breath. EXAM: PORTABLE CHEST 1 VIEW COMPARISON:  Chest radiograph 10/05/2022 FINDINGS: The left-sided vascular catheter is stable with tip terminating in the  right atrium. The heart is enlarged, unchanged. The upper mediastinal contours are prominent, also unchanged Lung volumes are low. There is worsened opacity in the right midlung. There is probable mild bibasilar subsegmental atelectasis. There is vascular congestion and probable mild pulmonary interstitial edema, slightly worsened. There is no significant pleural effusion there is no pneumothorax. There is no acute osseous abnormality. IMPRESSION: Cardiomegaly with vascular congestion and probable mild pulmonary interstitial edema, slightly worsened. More focal airspace opacity in the right midlung could reflect superimposed infection in the correct clinical setting. Electronically Signed   By: Lesia Hausen M.D.   On: 10/09/2022 08:44     Scheduled Meds:  calcitRIOL  0.5 mcg Oral Q T,Th,Sat-1800   Chlorhexidine Gluconate Cloth  6 each Topical Q0600   [START ON 10/11/2022] Chlorhexidine Gluconate Cloth  6 each Topical Q0600   Darbepoetin Alfa  150 mcg Subcutaneous Q Thu-1800   feeding supplement (NEPRO CARB STEADY)  237 mL Oral BID BM   heparin  5,000 Units Subcutaneous Q8H   methocarbamol  250 mg Oral BID   midodrine  10 mg Oral TID WC   pantoprazole  40 mg Oral Daily   Continuous Infusions:  anticoagulant sodium citrate     ceFAZolin Stopped (10/09/22 1842)     LOS: 1 day    Time spent: 50 mins    Willeen Niece, MD [facility]   If 7PM-7AM, please contact night-coverage

## 2022-10-10 NOTE — Progress Notes (Cosign Needed)
Daily Progress Note  DOA: 10/09/2022 Hospital Day: 2 Chief Complaint: Anemia   Assessment and Plan:    62 yo male with ESRD s/p failed transplant on HD. We saw him this admission for worsening anemia. He is admitted with L5/S1 discitis,  osteomyelitis , MSSA bacteremia, mitral valve endocarditis and septic emboli.   Acute on chronic anemia / FOBT+ but no overt GI bleeding -Transfused 2 units of blood with improvement in hgb to 7.9 -He is high risk for procedure / sedation. No plans for EGD / colonoscopy at this point.  -BID PPI   Subjective / New Events:  In dialysis. No complaints.    Objective:   Recent Labs    10/09/22 1138 10/09/22 1802 10/10/22 1300  WBC 14.3* 13.9* 14.0*  HGB 7.7* 8.3* 7.9*  HCT 24.8* 26.4* 25.8*  PLT 377 407* 363   BMET Recent Labs    10/09/22 1138 10/09/22 1802 10/09/22 1805 10/10/22 1300  NA 124*  --  128* 126*  K 3.9  --  3.8 3.8  CL 88*  --  89* 90*  CO2 24  --  24 23  GLUCOSE 115*  --  122* 114*  BUN 40*  --  25* 32*  CREATININE 7.42* 5.27* 5.13* 6.50*  CALCIUM 7.9*  --  8.2* 8.3*   LFT Recent Labs    10/10/22 1300  PROT 7.6  ALBUMIN 2.0*  AST 41  ALT 7  ALKPHOS 107  BILITOT 0.7   PT/INR No results for input(s): "LABPROT", "INR" in the last 72 hours.   Imaging:  DG Swallowing Func-Speech Pathology Table formatting from the original result was not included. Modified Barium Swallow Study  Patient Details  Name: RACE LATOUR MRN: [id number] Date of Birth: December 22, 1960  Today's Date: 10/09/2022  HPI/PMH: HPI: Pt is a 62 y/o male admitted secondary to acute back pain and found  to have L5-S1 discitis/osteomyelitis, SBO and MSSA bacteremia likely 2/2  line infection. Pt had HD cath replaced with rt femoral temp cath. 09/22/22  Hemodialysis Catheter Left Subclavian Double lumen Permanent (Tunneled).  Note new findings on MRI of scattered infarcts on B cerebral hemispheres,  and R cerebellar infarct.  PMH  including but not limited to hypertension,  hyperlipidemia, ESRD.  Clinical Impression: Clinical Impression: Overall, pt presented w/ a safe and efficient  oropharyngeal swallow. With large sips, oral residue w/ subsequent  freespill to BOT and valleculae was noted. However, pt demonstrated  excellent sensation of this residue and was consistently able to trigger  the additional swallow as needed. Otherwise, adequate pharyngeal clearance  noted and no airway invasion was evident throughout trials. Cough x2 noted  overall, though unrelated to airway invasion. Would recommend continued  diet of regular textures and thin liquids, however, pt requested Dys 3  textures. Continue with meds whole via liquids. No dypshagia intervention  required, conitnue w/ current POC for cognitive tx.  Factors that may increase risk of adverse event in presence of aspiration  [name] & [name] 2021): Factors that may increase risk of adverse event  in presence of aspiration [name] & [name] 2021): Poor general health  and/or compromised immunity; Respiratory or GI disease  Recommendations/Plan: Swallowing Evaluation Recommendations Swallowing Evaluation Recommendations Recommendations: PO diet PO Diet Recommendation: Regular; Thin liquids (Level 0) Liquid Administration via: Straw; Cup Medication Administration: Whole meds with liquid Supervision: Patient able to self-feed Swallowing strategies  : Minimize environmental distractions; Small  bites/sips; Slow rate Postural changes: Stay upright 30-60  min after meals; Position pt fully  upright for meals Oral care recommendations: Oral care BID (2x/day); Pt independent with  oral care  Treatment Plan Treatment Plan Treatment recommendations: Therapy as outlined in treatment plan below Recommendations Comment: no dysphagia intervention required, conitnue w/  current POC for cognition Treatment frequency: Min 1x/week Treatment duration: 1  week Interventions: Other (comment) (no dysphagia intervention - continue w/  cog tx only)  Recommendations Recommendations for follow up therapy are one component of a  multi-disciplinary discharge planning process, led by the attending  physician.  Recommendations may be updated based on patient status,  additional functional criteria and insurance authorization.  Assessment: Orofacial Exam: Orofacial Exam Oral Cavity: Oral Hygiene: WFL Oral Cavity - Dentition: Adequate natural dentition Orofacial Anatomy: WFL Oral Motor/Sensory Function: WFL  Anatomy:  Anatomy: WFL  Boluses Administered: Boluses Administered Boluses Administered: Thin liquids (Level 0); Mildly thick liquids (Level  2, nectar thick); Puree; Solid     Oral Impairment Domain: Oral Impairment Domain Lip Closure: No labial escape Tongue control during bolus hold: Cohesive bolus between tongue to palatal  seal Bolus preparation/mastication: Timely and efficient chewing and mashing Bolus transport/lingual motion: Brisk tongue motion Oral residue: Trace residue lining oral structures Location of oral residue : Tongue Initiation of pharyngeal swallow : Pyriform sinuses     Pharyngeal Impairment Domain: Pharyngeal Impairment Domain Soft palate elevation: No bolus between soft palate (SP)/pharyngeal wall  (PW) Laryngeal elevation: Complete superior movement of thyroid cartilage with  complete approximation of arytenoids to epiglottic petiole Anterior hyoid excursion: Complete anterior movement Epiglottic movement: Complete inversion Laryngeal vestibule closure: Complete, no air/contrast in laryngeal  vestibule Pharyngeal stripping wave : Present - complete Pharyngeal contraction (A/P view only): N/A Pharyngoesophageal segment opening: Complete distension and complete  duration, no obstruction of flow Tongue base retraction: No contrast between tongue base and posterior  pharyngeal wall  (PPW) Pharyngeal residue: Trace residue within or on pharyngeal structures Location of pharyngeal residue: Valleculae; Tongue base     Esophageal Impairment Domain: Esophageal Impairment Domain Esophageal clearance upright position: Esophageal retention  Pill: No data recorded  Penetration/Aspiration Scale Score: Penetration/Aspiration Scale Score 1.  Material does not enter airway: Thin liquids (Level 0); Mildly thick  liquids (Level 2, nectar thick); Puree; Solid  Compensatory Strategies: Compensatory Strategies Compensatory strategies: Yes Straw: Effective Effective Straw: Thin liquid (Level 0); Mildly thick liquid (Level 2,  nectar thick) Multiple swallows: Effective Effective Multiple Swallows: Thin liquid (Level 0); Mildly thick liquid  (Level 2, nectar thick)      General Information: Caregiver present: No   Diet Prior to this Study: Regular; Thin liquids (Level 0)    Temperature : Normal    Respiratory Status: WFL    Supplemental O2: Nasal cannula (3L O2)    History of Recent Intubation: No   Behavior/Cognition: Alert; Cooperative; Pleasant mood  Self-Feeding Abilities: Able to self-feed  Baseline vocal quality/speech: Normal  Volitional Cough: Able to elicit  Volitional Swallow: Able to elicit  Exam Limitations: No limitations  Goal Planning: No data recorded No data recorded No data recorded No data recorded Consulted and agree with results and recommendations: Patient; Nurse;  Physician  Pain: Pain Assessment Pain Assessment: No/denies pain Pain Score: 0  End of Session: Start Time:No data recorded Stop Time: No data recorded Time Calculation:No data recorded Charges: No data recorded SLP visit diagnosis: No data recorded  Past Medical History:  Past Medical History:  Diagnosis Date   Allergy    Anemia  Blood transfusion without reported diagnosis    Dialysis patient (HCC)    Tues,thurs,sat   ESRD (end stage renal  disease) (HCC)    TTHS-    Family history of adverse reaction to anesthesia    father had allergic reaction with anesthesia with a dental procedure-(pt.  doesn't know)   Hyperlipidemia    Hypertension    Past Surgical History:  Past Surgical History:  Procedure Laterality Date   A/V FISTULAGRAM Left 12/15/2016   Procedure: A/V Fistulagram - left;  Surgeon: Chuck Hint, MD;   Location: [facility] INVASIVE CV LAB;  Service: Cardiovascular;  Laterality: Left;    AV FISTULA PLACEMENT Left 08/28/2016   Procedure: LEFT UPPER  ARM ARTERIOVENOUS (AV) FISTULA CREATION;  Surgeon:  Chuck Hint, MD;  Location: [facility] OR;  Service: Vascular;   Laterality: Left;   DIALYSIS/PERMA CATHETER INSERTION Right 12/21/2017   Procedure: INSERTION OF DIALYSIS CATHETER Right Internal Jugular .;   Surgeon: Sherren Kerns, MD;  Location: [facility] OR;  Service: Vascular;   Laterality: Right;   FISTULA SUPERFICIALIZATION Left 09/23/2017   Procedure: FISTULA PLICATION LEFT ARM;  Surgeon: Sherren Kerns, MD;   Location: [facility] OR;  Service: Vascular;  Laterality: Left;   FISTULOGRAM Left 12/21/2017   Procedure: FISTULOGRAM with Balloon Angioplasty.;  Surgeon: Sherren Kerns, MD;  Location: [facility] OR;  Service: Vascular;  Laterality: Left;   HEMATOMA EVACUATION Left 08/27/2020   Procedure: EVACUATION HEMATOMA LEFT ARM;  Surgeon: Sherren Kerns, MD;   Location: [facility] OR;  Service: Vascular;  Laterality: Left;   IR FLUORO GUIDE CV LINE LEFT  09/22/2022   IR FLUORO GUIDE CV LINE RIGHT  09/12/2022   IR FLUORO GUIDE CV LINE RIGHT  09/17/2022   IR REMOVAL TUN CV CATH W/O FL  09/09/2022   IR THORACENTESIS ASP PLEURAL SPACE W/IMG GUIDE  08/22/2017   1.2 L -right-sided   IR THORACENTESIS ASP PLEURAL SPACE W/IMG GUIDE  10/19/2017   IR US GUIDE VASC ACCESS LEFT  09/22/2022   IR US GUIDE VASC ACCESS RIGHT  09/12/2022   KIDNEY TRANSPLANT     REVISON OF ARTERIOVENOUS FISTULA Left 12/21/2017   Procedure: PLICATION and  Ligation of F LEFT ARM ARTERIOVENOUS FISTULA;   Surgeon: Sherren Kerns, MD;  Location: [facility] OR;  Service: Vascular;   Laterality: Left;   REVISON OF ARTERIOVENOUS FISTULA Left 07/16/2020   Procedure: EXCISION OF LEFT ARM ARTERIOVENOUS FISTULA;  Surgeon: Sherren Kerns, MD;  Location: [facility] OR;  Service: Vascular;  Laterality: Left;   stent in kidneys     dec 2017   TRANSTHORACIC ECHOCARDIOGRAM  09/22/2017    Severe LVH.  Normal function -EF 55-60%.  GRII DD.  Moderate RV dilation  with mildly reduced RV function.   Fixed right coronary cusp with very  mild aortic stenosis.  The myocardium has a speckled appearance --> .    Recommend cardiac MRI to evaluate for amyloid   UPPER EXTREMITY VENOGRAPHY Bilateral 06/01/2020   Procedure: UPPER EXTREMITY VENOGRAPHY;  Surgeon: Chuck Hint,  MD;  Location: [facility] INVASIVE CV LAB;  Service: Cardiovascular;  Laterality:  Bilateral;   Pati Gallo 10/09/2022, 10:11 AM DG CHEST PORT 1 VIEW CLINICAL DATA:  Recent pneumonia, low O2 sats with shortness of breath.  EXAM: PORTABLE CHEST 1 VIEW  COMPARISON:  Chest radiograph 10/05/2022  FINDINGS: The left-sided vascular catheter is stable with tip terminating in the right atrium. The heart is enlarged, unchanged. The upper mediastinal  contours are prominent, also unchanged  Lung volumes are low. There is worsened opacity in the right midlung. There is probable mild bibasilar subsegmental atelectasis. There is vascular congestion and probable mild pulmonary interstitial edema, slightly worsened. There is no significant pleural effusion there is no pneumothorax.  There is no acute osseous abnormality.  IMPRESSION: Cardiomegaly with vascular congestion and probable mild pulmonary interstitial edema, slightly worsened. More focal airspace opacity in the right midlung could reflect superimposed infection in the correct clinical setting.  Electronically Signed   By: Lesia Hausen  M.D.   On: 10/09/2022 08:44     Scheduled inpatient medications:   calcitRIOL  0.5 mcg Oral Q T,Th,Sat-1800   Chlorhexidine Gluconate Cloth  6 each Topical Q0600   [START ON 10/11/2022] Chlorhexidine Gluconate Cloth  6 each Topical Q0600   Darbepoetin Alfa  150 mcg Subcutaneous Q Thu-1800   feeding supplement (NEPRO CARB STEADY)  237 mL Oral BID BM   heparin  5,000 Units Subcutaneous Q8H   heparin sodium (porcine)       methocarbamol  250 mg Oral BID   midodrine  10 mg Oral TID WC   pantoprazole  40 mg Oral Daily   Continuous inpatient infusions:   anticoagulant sodium citrate     ceFAZolin Stopped (10/09/22 1842)   PRN inpatient medications: acetaminophen **OR** acetaminophen, alteplase, anticoagulant sodium citrate, bisacodyl, guaiFENesin, guaiFENesin-codeine, heparin, heparin sodium (porcine), hydrALAZINE, ipratropium-albuterol, lidocaine (PF), lidocaine-prilocaine, metoprolol tartrate, ondansetron **OR** ondansetron (ZOFRAN) IV, oxyCODONE, pentafluoroprop-tetrafluoroeth, senna-docusate  Vital signs in last 24 hours: Temp:  [97.5 F (36.4 C)-98.4 F (36.9 C)] 97.5 F (36.4 C) (07/19 1645) Pulse Rate:  [99-112] 112 (07/19 1645) Resp:  [19-32] 30 (07/19 1645) BP: (114-132)/(74-86) 120/84 (07/19 1645) SpO2:  [93 %-100 %] 96 % (07/19 1645) Weight:  [81 kg-83.5 kg] 81 kg (07/19 1645) Last BM Date : 10/09/22  Intake/Output Summary (Last 24 hours) at 10/10/2022 1704 Last data filed at 10/10/2022 1636 Gross per 24 hour  Intake 700.07 ml  Output 2500 ml  Net -1799.93 ml    Intake/Output from previous day: 07/18 0701 - 07/19 0700 In: 700.1 [P.O.:600; IV Piggyback:100.1] Out: -  Intake/Output this shift: Total I/O In: -  Out: 2500 [Other:2500]   Physical Exam:  General: Alert male in NAD Heart:  Regular rate and rhythm.  Pulmonary: Normal respiratory effort Abdomen: Soft, nondistended, nontender. Normal bowel sounds Neurologic: Alert and oriented Psych: Pleasant.  Cooperative. Insight appears normal.    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

## 2022-10-10 NOTE — Progress Notes (Signed)
Received patient in bed.Awake,alert and oriented x 4.  Access used : Left HD catheter that worked well.Dressing on date.  Duration of treatment : 3 hours.  Fluid removed : Met prescribed uf of 2.5 liters.  Hemo comment:Tolerated treatment.  Hand off to the patient's nurse.

## 2022-10-11 DIAGNOSIS — I953 Hypotension of hemodialysis: Secondary | ICD-10-CM | POA: Diagnosis not present

## 2022-10-11 DIAGNOSIS — N186 End stage renal disease: Secondary | ICD-10-CM | POA: Diagnosis not present

## 2022-10-11 DIAGNOSIS — I76 Septic arterial embolism: Secondary | ICD-10-CM | POA: Diagnosis not present

## 2022-10-11 DIAGNOSIS — J189 Pneumonia, unspecified organism: Secondary | ICD-10-CM | POA: Diagnosis not present

## 2022-10-11 DIAGNOSIS — T8612 Kidney transplant failure: Secondary | ICD-10-CM | POA: Diagnosis not present

## 2022-10-11 DIAGNOSIS — I4891 Unspecified atrial fibrillation: Secondary | ICD-10-CM | POA: Diagnosis not present

## 2022-10-11 DIAGNOSIS — I39 Endocarditis and heart valve disorders in diseases classified elsewhere: Secondary | ICD-10-CM | POA: Diagnosis present

## 2022-10-11 DIAGNOSIS — I330 Acute and subacute infective endocarditis: Secondary | ICD-10-CM | POA: Diagnosis not present

## 2022-10-11 DIAGNOSIS — I2690 Septic pulmonary embolism without acute cor pulmonale: Secondary | ICD-10-CM | POA: Diagnosis not present

## 2022-10-11 DIAGNOSIS — I5033 Acute on chronic diastolic (congestive) heart failure: Secondary | ICD-10-CM | POA: Diagnosis not present

## 2022-10-11 DIAGNOSIS — J9601 Acute respiratory failure with hypoxia: Secondary | ICD-10-CM | POA: Diagnosis not present

## 2022-10-11 DIAGNOSIS — E440 Moderate protein-calorie malnutrition: Secondary | ICD-10-CM | POA: Diagnosis not present

## 2022-10-11 DIAGNOSIS — I9589 Other hypotension: Secondary | ICD-10-CM | POA: Diagnosis not present

## 2022-10-11 DIAGNOSIS — I4719 Other supraventricular tachycardia: Secondary | ICD-10-CM | POA: Diagnosis not present

## 2022-10-11 DIAGNOSIS — R195 Other fecal abnormalities: Secondary | ICD-10-CM | POA: Insufficient documentation

## 2022-10-11 LAB — BASIC METABOLIC PANEL
Anion gap: 18 — ABNORMAL HIGH (ref 5–15)
BUN: 21 mg/dL (ref 8–23)
CO2: 21 mmol/L — ABNORMAL LOW (ref 22–32)
Calcium: 8.5 mg/dL — ABNORMAL LOW (ref 8.9–10.3)
Chloride: 90 mmol/L — ABNORMAL LOW (ref 98–111)
Creatinine, Ser: 4.59 mg/dL — ABNORMAL HIGH (ref 0.61–1.24)
GFR, Estimated: 14 mL/min — ABNORMAL LOW (ref >60)
Glucose, Bld: 107 mg/dL — ABNORMAL HIGH (ref 70–99)
Potassium: 3.9 mmol/L (ref 3.5–5.1)
Sodium: 129 mmol/L — ABNORMAL LOW (ref 135–145)

## 2022-10-11 LAB — CBC
HCT: 27.6 % — ABNORMAL LOW (ref 39.0–52.0)
Hemoglobin: 8.3 g/dL — ABNORMAL LOW (ref 13.0–17.0)
MCH: 25.3 pg — ABNORMAL LOW (ref 26.0–34.0)
MCHC: 30.1 g/dL (ref 30.0–36.0)
MCV: 84.1 fL (ref 80.0–100.0)
Platelets: 369 10*3/uL (ref 150–400)
RBC: 3.28 MIL/uL — ABNORMAL LOW (ref 4.22–5.81)
RDW: 17.3 % — ABNORMAL HIGH (ref 11.5–15.5)
WBC: 15.1 10*3/uL — ABNORMAL HIGH (ref 4.0–10.5)
nRBC: 2.4 % — ABNORMAL HIGH (ref 0.0–0.2)

## 2022-10-11 LAB — MAGNESIUM: Magnesium: 1.6 mg/dL — ABNORMAL LOW (ref 1.7–2.4)

## 2022-10-11 MED ORDER — DILTIAZEM HCL-DEXTROSE 125-5 MG/125ML-% IV SOLN (PREMIX)
5.0000 mg/h | INTRAVENOUS | Status: DC
  Administered 2022-10-11: 15 mg/h via INTRAVENOUS
  Administered 2022-10-11: 10 mg/h via INTRAVENOUS
  Administered 2022-10-12 (×2): 15 mg/h via INTRAVENOUS
  Filled 2022-10-11 (×5): qty 125

## 2022-10-11 MED ORDER — ANTICOAGULANT SODIUM CITRATE 4% (200MG/5ML) IV SOLN: 5.0000 mL | Status: DC | PRN

## 2022-10-11 MED ORDER — ALTEPLASE 2 MG IJ SOLR: 2.0000 mg | Freq: Once | INTRAMUSCULAR | Status: DC | PRN

## 2022-10-11 MED ORDER — DILTIAZEM HCL-DEXTROSE 125-5 MG/125ML-% IV SOLN (PREMIX)
INTRAVENOUS | Status: AC
  Administered 2022-10-11: 5 mg/h via INTRAVENOUS
  Filled 2022-10-11: qty 125

## 2022-10-11 MED ORDER — HEPARIN SODIUM (PORCINE) 1000 UNIT/ML DIALYSIS
1000.0000 [IU] | INTRAMUSCULAR | Status: DC | PRN
  Administered 2022-10-11: 1000 [IU]
  Filled 2022-10-11 (×2): qty 1

## 2022-10-11 MED ORDER — DILTIAZEM LOAD VIA INFUSION
10.0000 mg | Freq: Once | INTRAVENOUS | Status: AC
  Administered 2022-10-11: 10 mg via INTRAVENOUS
  Filled 2022-10-11: qty 10

## 2022-10-11 NOTE — Progress Notes (Signed)
  X-cover Note: Back in rapid afib. Restarting cardizem gtts.   Carollee Herter, DO [facility]

## 2022-10-11 NOTE — Progress Notes (Signed)
PROGRESS NOTE    Ryan [name]  NGE:[id number] DOB: 03-06-1961 DOA: 10/09/2022 PCP: Sharin Grave, MD    Brief Narrative:  This 62 y.o. male with medical history significant of HTN, HLD, ESRD on hemodialysis being admitted from CIR for concern about new onset atrial fibrillation with RVR.   Patient initially presented to the hospital on 6/6 with lower back pain and ultimately found to have evidence of L5/S1 discitis and osteomyelitis as well as MSSA bacteremia with mitral valve endocarditis. CT scan of the chest noted septic emboli with new airspace opacities in the left upper lobe . MRI brain noting scattered subcentimeter infarcts involving the bilateral cerebral hemisphere and right cerebellum. CT surgery has been consulted but recommended conservative management as. He was already responding to antibiotics. Plan was for patient to continue on antibiotics at cefazolin until 8/13 per ID recommendations. Hospital course had also been complicated by small bowel obstruction which has since resolved. Patient had been discharged to inpatient rehab on 7/4.    While in rehab medicine team was consulted on 7/11 for cough and dyspnea.  Eventually patient was diagnosed with concerns of pneumonia/aspiration therefore was on 5 days of Zosyn and also diagnosed of fluid overload being managed with hemodialysis.  He also reported of dysphagia and had drop in hemoglobin of 6.1 with Hemoccult positive stool.  Patient was seen by GI, esophagram performed which was negative for any acute pathology.   While getting hemodialysis on 7/18 patient went into atrial fibrillation with RVR therefore being admitted to the hospital for closer monitoring.   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  It was thought as New onset Atrial Fibrillation. Cardiology consulted. Continue IV as needed Lopressor for now.   No need for Anticoagulation.   Acute respiratory distress: - Combination of fluid overload and recovering from pneumonia -Completing course of Zosyn, will transition back to cefazolin.  Last day of cefazolin is 8/13.   - Continue As needed bronchodilators, I-S/flutter valve.   Acute diastolic congestive heart failure, class III: -Recent echocardiogram showed EF of 60%.  Patient does not produce any urine.   -Volume status will be managed by hemodialysis. HD oday   Dysphagia - Have esophagram which was negative.  Speech and swallow recommending D3 diet.   Anemia of chronic disease - Baseline hemoglobin 8.0 which has drifted down to 6.1 requiring 1 unit PRBC transfusion on 7/15.   Continue PPI.  LB GI following.   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  Plan the last day 8/13     DVT prophylaxis: Heparin sq Code Status:Full code Family Communication:No family Disposition Plan:    Status is: Inpatient Remains inpatient appropriate because: Continued inpatient workup.     Consultants:  Cardiology Nephrology  Procedures:  Antimicrobials: Anti-infectives (From admission, onward)    Start     Dose/Rate Route Frequency Ordered Stop   10/09/22 1800  ceFAZolin (ANCEF) IVPB 2g/100 mL premix  2 g 200 mL/hr over 30 Minutes Intravenous Every T-Th-Sa (1800) 10/09/22 1619         Subjective: Patient was seen and examined at bed side, overnight events noted. Serum sodium still remains low.  Patient  denies any other concerns.  Objective: Vitals:   10/11/22 1053 10/11/22 1058 10/11/22 1147 10/11/22 1350  BP: 126/73 118/71 124/87 119/76  Pulse: 97 98 (!) 104   Resp: (!) 36 (!) 27 (!) 21   Temp:  (!) 97.3 F (36.3 C) 97.7 F (36.5 C)   TempSrc:  Oral Oral   SpO2: 94% 95% 92%   Weight:  78.8 kg    Height:        Intake/Output Summary (Last 24 hours) at 10/11/2022 1526 Last data filed at 10/11/2022 1500 Gross per 24 hour  Intake 477.46 ml  Output 4500 ml  Net -4022.54 ml   Filed Weights   10/10/22 1645 10/11/22 0732 10/11/22 1058  Weight: 81 kg 79.1 kg 78.8 kg    Examination:  General exam: Appears comfortable, deconditioned, not in any acute distress. Respiratory system: Clear to auscultation. Respiratory effort normal. RR 14 Cardiovascular system: S1 & S2 heard, RRR. No JVD, murmurs, rubs, gallops or clicks.  Gastrointestinal system: Abdomen is nondistended, soft and nontender. Normal bowel sounds heard. Central nervous system: Alert and oriented x3. No focal neurological deficits. Extremities: No edema, no cyanosis, no clubbing Skin: No rashes, lesions or ulcers Psychiatry: Judgement and insight appear normal. Mood & affect appropriate.     Data Reviewed: I have personally reviewed following labs and imaging studies  CBC: Recent Labs  Lab 10/05/22 2023 10/06/22 0637 10/07/22 0548 10/08/22 0605 10/09/22 1138 10/09/22 1802 10/10/22 1300 10/11/22 0711  WBC 14.7* 13.8* 12.9* 13.7* 14.3* 13.9* 14.0* 15.1*  NEUTROABS 12.3* 11.1* 10.3*  --   --   --   --   --   HGB 7.0* 6.3* 6.1* 7.0* 7.7* 8.3* 7.9* 8.3*  HCT 22.8* 21.3* 20.0* 22.6* 24.8* 26.4* 25.8* 27.6*  MCV 83.2 83.5 82.6 81.3 81.8 82.8 83.0 84.1  PLT 344 321 358 349 377 407* 363 369   Basic Metabolic Panel: Recent Labs  Lab 10/06/22 0637 10/08/22 0605 10/09/22 1138 10/09/22 1802 10/09/22 1805 10/10/22 1300 10/10/22 1829 10/11/22 0246  NA 129*   < > 124*  --  128* 126* 130* 129*  K 3.8   < > 3.9   --  3.8 3.8 3.6 3.9  CL 92*   < > 88*  --  89* 90* 94* 90*  CO2 24   < > 24  --  24 23 23  21*  GLUCOSE 100*   < > 115*  --  122* 114* 98 107*  BUN 41*   < > 40*  --  25* 32* 16 21  CREATININE 7.89*   < > 7.42* 5.27* 5.13* 6.50* 3.90* 4.59*  CALCIUM 8.2*   < > 7.9*  --  8.2* 8.3* 8.4* 8.5*  MG  --   --   --   --   --   --   --  1.6*  PHOS 4.1  --  3.8  --  3.2 4.4 2.9  --    < > = values in this interval not displayed.   GFR: Estimated Creatinine Clearance: 16.9 mL/min (A) (by C-G formula based on SCr of 4.59 mg/dL (H)). Liver Function Tests: Recent Labs  Lab 10/06/22 4742 10/09/22 1138 10/09/22 1805 10/10/22 1300 10/10/22 1829  AST  --   --   --  41  --   ALT  --   --   --  7  --   ALKPHOS  --   --   --  107  --   BILITOT  --   --   --  0.7  --   PROT  --   --   --  7.6  --   ALBUMIN 1.6* 1.9* 2.0* 2.0* 2.0*   No results for input(s): "LIPASE", "AMYLASE" in the last 168 hours. No results for input(s): "AMMONIA" in the last 168 hours. Coagulation Profile: Recent Labs  Lab 10/10/22 1829  INR 1.4*   Cardiac Enzymes: No results for input(s): "CKTOTAL", "CKMB", "CKMBINDEX", "TROPONINI" in the last 168 hours. BNP (last 3 results) No results for input(s): "PROBNP" in the last 8760 hours. HbA1C: No results for input(s): "HGBA1C" in the last 72 hours. CBG: No results for input(s): "GLUCAP" in the last 168 hours. Lipid Profile: No results for input(s): "CHOL", "HDL", "LDLCALC", "TRIG", "CHOLHDL", "LDLDIRECT" in the last 72 hours. Thyroid Function Tests: Recent Labs    10/09/22 1802  TSH 8.976*   Anemia Panel: No results for input(s): "VITAMINB12", "FOLATE", "FERRITIN", "TIBC", "IRON", "RETICCTPCT" in the last 72 hours. Sepsis Labs: Recent Labs  Lab 10/05/22 2023 10/05/22 2257  PROCALCITON 5.04  --   LATICACIDVEN 1.3 1.0    Recent Results (from the past 240 hour(s))  Culture, blood (Routine X 2) w Reflex to ID Panel     Status: None   Collection Time: 10/05/22   8:23 PM   Specimen: BLOOD  Result Value Ref Range Status   Specimen Description BLOOD BLOOD RIGHT HAND  Final   Special Requests   Final    BOTTLES DRAWN AEROBIC AND ANAEROBIC Blood Culture adequate volume   Culture   Final    NO GROWTH 5 DAYS Performed at [facility], [address] [place], [place] [address]    Report Status 10/10/2022 FINAL  Final  Culture, blood (Routine X 2) w Reflex to ID Panel     Status: None   Collection Time: 10/05/22  8:32 PM   Specimen: BLOOD  Result Value Ref Range Status   Specimen Description BLOOD BLOOD RIGHT ARM  Final   Special Requests   Final    BOTTLES DRAWN AEROBIC AND ANAEROBIC Blood Culture adequate volume   Culture   Final    NO GROWTH 5 DAYS Performed at [facility], [address] [place], [place] [address]    Report Status 10/10/2022 FINAL  Final  Respiratory (~20 pathogens) panel by PCR     Status: None   Collection Time: 10/06/22  1:58 AM   Specimen: Nasopharyngeal Swab; Respiratory  Result Value Ref Range Status   Adenovirus NOT DETECTED NOT DETECTED Final   Coronavirus 229E NOT DETECTED NOT DETECTED Final    Comment: (NOTE) The Coronavirus on the Respiratory Panel, DOES NOT test for the novel  Coronavirus (2019 nCoV)    Coronavirus HKU1 NOT DETECTED NOT DETECTED Final   Coronavirus NL63 NOT DETECTED NOT DETECTED Final   Coronavirus OC43 NOT DETECTED NOT DETECTED Final   Metapneumovirus NOT DETECTED NOT DETECTED Final   Rhinovirus / Enterovirus NOT DETECTED NOT DETECTED Final   Influenza A NOT DETECTED NOT DETECTED Final   Influenza B NOT DETECTED NOT DETECTED Final   Parainfluenza Virus 1 NOT DETECTED NOT DETECTED Final   Parainfluenza Virus 2 NOT DETECTED NOT DETECTED Final   Parainfluenza Virus 3 NOT DETECTED NOT DETECTED  Final   Parainfluenza Virus 4 NOT DETECTED NOT DETECTED Final   Respiratory Syncytial Virus NOT DETECTED NOT DETECTED Final   Bordetella pertussis NOT DETECTED NOT DETECTED Final    Bordetella Parapertussis NOT DETECTED NOT DETECTED Final   Chlamydophila pneumoniae NOT DETECTED NOT DETECTED Final   Mycoplasma pneumoniae NOT DETECTED NOT DETECTED Final    Comment: Performed at [facility], [address] [place], [place] [address]  SARS Coronavirus 2 by RT PCR (hospital order, performed in [facility] hospital lab) *cepheid single result test* Anterior Nasal Swab     Status: None   Collection Time: 10/06/22  1:58 AM   Specimen: Anterior Nasal Swab  Result Value Ref Range Status   SARS Coronavirus 2 by RT PCR NEGATIVE NEGATIVE Final    Comment: Performed at [facility], [address], [place], [place] [address]    Radiology Studies: No results found.   Scheduled Meds:  calcitRIOL  0.5 mcg Oral Q T,Th,Sat-1800   Chlorhexidine Gluconate Cloth  6 each Topical Q0600   Darbepoetin Alfa  150 mcg Subcutaneous Q Thu-1800   feeding supplement (NEPRO CARB STEADY)  237 mL Oral BID BM   heparin  5,000 Units Subcutaneous Q8H   methocarbamol  250 mg Oral BID   midodrine  10 mg Oral TID WC   pantoprazole  40 mg Oral Daily   Continuous Infusions:  ceFAZolin Stopped (10/11/22 1215)   diltiazem (CARDIZEM) infusion 15 mg/hr (10/11/22 1500)     LOS: 2 days    Time spent: 35 mins    Willeen Niece, MD [facility]   If 7PM-7AM, please contact night-coverage

## 2022-10-11 NOTE — Procedures (Signed)
Patient was seen on dialysis and the procedure was supervised.  BFR 400  Via TDC BP is  125/73.   Patient appears to be tolerating treatment well.  Franke Menter Jaynie Collins 10/11/2022

## 2022-10-11 NOTE — Progress Notes (Signed)
[facility] Progress Note   Subjective:   Pt tolerated HD yesterday with net UF 2.5L but went back into a.fib RVR overnight and was started on cardizem drip. He is feeling better now, HR controlled. Denies SOB but is still on O2 and is describing some orthopnea.   Objective Vitals:   10/11/22 0702 10/11/22 0719 10/11/22 0732 10/11/22 0748  BP:   123/75 122/74  Pulse: (!) 101 100 (!) 102 (!) 131  Resp: (!) 36 (!) 36 (!) 28 19  Temp:   97.7 F (36.5 C)   TempSrc:   Oral   SpO2: 91% 91% 94% 100%  Weight:   79.1 kg   Height:       Physical Exam General: Well appearing male in NAD Heart: slightly tachycardic, no murmurs, rubs or gallops Lungs: CTA bilaterally Abdomen: Soft, non-distended, +BS Extremities: No edema b/l lower extremities Dialysis Access: [facility]  Additional Objective Labs: Basic Metabolic Panel: Recent Labs  Lab 10/09/22 1805 10/10/22 1300 10/10/22 1829 10/11/22 0246  NA 128* 126* 130* 129*  K 3.8 3.8 3.6 3.9  CL 89* 90* 94* 90*  CO2 24 23 23  21*  GLUCOSE 122* 114* 98 107*  BUN 25* 32* 16 21  CREATININE 5.13* 6.50* 3.90* 4.59*  CALCIUM 8.2* 8.3* 8.4* 8.5*  PHOS 3.2 4.4 2.9  --    Liver Function Tests: Recent Labs  Lab 10/09/22 1805 10/10/22 1300 10/10/22 1829  AST  --  41  --   ALT  --  7  --   ALKPHOS  --  107  --   BILITOT  --  0.7  --   PROT  --  7.6  --   ALBUMIN 2.0* 2.0* 2.0*   No results for input(s): "LIPASE", "AMYLASE" in the last 168 hours. CBC: Recent Labs  Lab 10/05/22 2023 10/06/22 0637 10/07/22 0548 10/08/22 0605 10/09/22 1138 10/09/22 1802 10/10/22 1300 10/11/22 0711  WBC 14.7* 13.8* 12.9* 13.7* 14.3* 13.9* 14.0* 15.1*  NEUTROABS 12.3* 11.1* 10.3*  --   --   --   --   --   HGB 7.0* 6.3* 6.1* 7.0* 7.7* 8.3* 7.9* 8.3*  HCT 22.8* 21.3* 20.0* 22.6* 24.8* 26.4* 25.8* 27.6*  MCV 83.2 83.5 82.6 81.3 81.8 82.8 83.0 84.1  PLT 344 321 358 349 377 407* 363 369   Blood Culture    Component Value Date/Time   SDES  BLOOD BLOOD RIGHT ARM 10/05/2022 2032   SPECREQUEST  10/05/2022 2032    BOTTLES DRAWN AEROBIC AND ANAEROBIC Blood Culture adequate volume   CULT  10/05/2022 2032    NO GROWTH 5 DAYS Performed at [facility], [address] [place], [place] [address]    REPTSTATUS 10/10/2022 FINAL 10/05/2022 2032    Cardiac Enzymes: No results for input(s): "CKTOTAL", "CKMB", "CKMBINDEX", "TROPONINI" in the last 168 hours. CBG: No results for input(s): "GLUCAP" in the last 168 hours. Iron Studies: No results for input(s): "IRON", "TIBC", "TRANSFERRIN", "FERRITIN" in the last 72 hours. @lablastinr3 @ Studies/Results: DG Swallowing Func-Speech Pathology  Result Date: 10/09/2022 Table formatting from the original result was not included. Modified Barium Swallow Study Patient Details Name: Ryan [name] MRN: [id number] Date of Birth: 1960/08/31 Today's Date: 10/09/2022 HPI/PMH: HPI: Pt is a 62 y/o male admitted secondary to acute back pain and found to have L5-S1 discitis/osteomyelitis, SBO and MSSA bacteremia likely 2/2 line infection. Pt had HD cath replaced with rt femoral temp cath. 09/22/22 Hemodialysis Catheter Left Subclavian Double lumen Permanent (  Tunneled). 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  PMH including but not limited to hypertension, hyperlipidemia, ESRD. Clinical Impression: Clinical Impression: Overall, pt presented w/ a safe and efficient oropharyngeal swallow. With large sips, oral residue w/ subsequent freespill to BOT and valleculae was noted. However, pt demonstrated excellent sensation of this residue and was consistently able to trigger the additional swallow as needed. Otherwise, adequate pharyngeal clearance noted and no airway invasion was evident throughout trials. Cough x2 noted overall, though unrelated to airway invasion. Would recommend continued diet of regular textures and thin liquids, however, pt requested Dys 3  textures. Continue with meds whole via liquids. No dypshagia intervention required, conitnue w/ current POC for cognitive tx. Factors that may increase risk of adverse event in presence of aspiration [name] & [name] 2021): Factors that may increase risk of adverse event in presence of aspiration [name] & [name] 2021): Poor general health and/or compromised immunity; Respiratory or GI disease Recommendations/Plan: Swallowing Evaluation Recommendations Swallowing Evaluation Recommendations Recommendations: PO diet PO Diet Recommendation: Regular; Thin liquids (Level 0) Liquid Administration via: Straw; Cup Medication Administration: Whole meds with liquid Supervision: Patient able to self-feed Swallowing strategies  : Minimize environmental distractions; Small bites/sips; Slow rate Postural changes: Stay upright 30-60 min after meals; Position pt fully upright for meals Oral care recommendations: Oral care BID (2x/day); Pt independent with oral care Treatment Plan Treatment Plan Treatment recommendations: Therapy as outlined in treatment plan below Recommendations Comment: no dysphagia intervention required, conitnue w/ current POC for cognition Treatment frequency: Min 1x/week Treatment duration: 1 week Interventions: Other (comment) (no dysphagia intervention - continue w/ cog tx only) Recommendations Recommendations for follow up therapy are one component of a multi-disciplinary discharge planning process, led by the attending physician.  Recommendations may be updated based on patient status, additional functional criteria and insurance authorization. Assessment: Orofacial Exam: Orofacial Exam Oral Cavity: Oral Hygiene: WFL Oral Cavity - Dentition: Adequate natural dentition Orofacial Anatomy: WFL Oral Motor/Sensory Function: WFL Anatomy: Anatomy: WFL Boluses Administered: Boluses Administered Boluses Administered: Thin liquids (Level 0); Mildly thick liquids (Level 2, nectar thick); Puree; Solid  Oral  Impairment Domain: Oral Impairment Domain Lip Closure: No labial escape Tongue control during bolus hold: Cohesive bolus between tongue to palatal seal Bolus preparation/mastication: Timely and efficient chewing and mashing Bolus transport/lingual motion: Brisk tongue motion Oral residue: Trace residue lining oral structures Location of oral residue : Tongue Initiation of pharyngeal swallow : Pyriform sinuses  Pharyngeal Impairment Domain: Pharyngeal Impairment Domain Soft palate elevation: No bolus between soft palate (SP)/pharyngeal wall (PW) Laryngeal elevation: Complete superior movement of thyroid cartilage with complete approximation of arytenoids to epiglottic petiole Anterior hyoid excursion: Complete anterior movement Epiglottic movement: Complete inversion Laryngeal vestibule closure: Complete, no air/contrast in laryngeal vestibule Pharyngeal stripping wave : Present - complete Pharyngeal contraction (A/P view only): N/A Pharyngoesophageal segment opening: Complete distension and complete duration, no obstruction of flow Tongue base retraction: No contrast between tongue base and posterior pharyngeal wall (PPW) Pharyngeal residue: Trace residue within or on pharyngeal structures Location of pharyngeal residue: Valleculae; Tongue base  Esophageal Impairment Domain: Esophageal Impairment Domain Esophageal clearance upright position: Esophageal retention Pill: No data recorded Penetration/Aspiration Scale Score: Penetration/Aspiration Scale Score 1.  Material does not enter airway: Thin liquids (Level 0); Mildly thick liquids (Level 2, nectar thick); Puree; Solid Compensatory Strategies: Compensatory Strategies Compensatory strategies: Yes Straw: Effective Effective Straw: Thin liquid (Level 0); Mildly thick liquid (Level 2, nectar thick)  Multiple swallows: Effective Effective Multiple Swallows: Thin liquid (Level 0); Mildly thick liquid (Level 2, nectar thick)   General Information: Caregiver present: No   Diet Prior to this Study: Regular; Thin liquids (Level 0)   Temperature : Normal   Respiratory Status: WFL   Supplemental O2: Nasal cannula (3L O2)   History of Recent Intubation: No  Behavior/Cognition: Alert; Cooperative; Pleasant mood Self-Feeding Abilities: Able to self-feed Baseline vocal quality/speech: Normal Volitional Cough: Able to elicit Volitional Swallow: Able to elicit Exam Limitations: No limitations Goal Planning: No data recorded No data recorded No data recorded No data recorded Consulted and agree with results and recommendations: Patient; Nurse; Physician Pain: Pain Assessment Pain Assessment: No/denies pain Pain Score: 0 End of Session: Start Time:No data recorded Stop Time: No data recorded Time Calculation:No data recorded Charges: No data recorded SLP visit diagnosis: No data recorded Past Medical History: Past Medical History: Diagnosis Date  Allergy   Anemia   Blood transfusion without reported diagnosis   Dialysis patient (HCC)   Tues,thurs,sat  ESRD (end stage renal disease) (HCC)   TTHS-   Family history of adverse reaction to anesthesia   father had allergic reaction with anesthesia with a dental procedure-(pt. doesn't know)  Hyperlipidemia   Hypertension  Past Surgical History: Past Surgical History: Procedure Laterality Date  A/V FISTULAGRAM Left 12/15/2016  Procedure: A/V Fistulagram - left;  Surgeon: Chuck Hint, MD;  Location: [facility] INVASIVE CV LAB;  Service: Cardiovascular;  Laterality: Left;  AV FISTULA PLACEMENT Left 08/28/2016  Procedure: LEFT UPPER  ARM ARTERIOVENOUS (AV) FISTULA CREATION;  Surgeon: Chuck Hint, MD;  Location: [facility] OR;  Service: Vascular;  Laterality: Left;  DIALYSIS/PERMA CATHETER INSERTION Right 12/21/2017  Procedure: INSERTION OF DIALYSIS CATHETER Right Internal Jugular .;  Surgeon: Sherren Kerns, MD;  Location: [facility] OR;  Service: Vascular;  Laterality: Right;  FISTULA SUPERFICIALIZATION Left 09/23/2017  Procedure: FISTULA PLICATION LEFT  ARM;  Surgeon: Sherren Kerns, MD;  Location: [facility] OR;  Service: Vascular;  Laterality: Left;  FISTULOGRAM Left 12/21/2017  Procedure: FISTULOGRAM with Balloon Angioplasty.;  Surgeon: Sherren Kerns, MD;  Location: [facility] OR;  Service: Vascular;  Laterality: Left;  HEMATOMA EVACUATION Left 08/27/2020  Procedure: EVACUATION HEMATOMA LEFT ARM;  Surgeon: Sherren Kerns, MD;  Location: [facility] OR;  Service: Vascular;  Laterality: Left;  IR FLUORO GUIDE CV LINE LEFT  09/22/2022  IR FLUORO GUIDE CV LINE RIGHT  09/12/2022  IR FLUORO GUIDE CV LINE RIGHT  09/17/2022  IR REMOVAL TUN CV CATH W/O FL  09/09/2022  IR THORACENTESIS ASP PLEURAL SPACE W/IMG GUIDE  08/22/2017  1.2 L -right-sided  IR THORACENTESIS ASP PLEURAL SPACE W/IMG GUIDE  10/19/2017  IR US GUIDE VASC ACCESS LEFT  09/22/2022  IR US GUIDE VASC ACCESS RIGHT  09/12/2022  KIDNEY TRANSPLANT    REVISON OF ARTERIOVENOUS FISTULA Left 12/21/2017  Procedure: PLICATION and Ligation of F LEFT ARM ARTERIOVENOUS FISTULA;  Surgeon: Sherren Kerns, MD;  Location: [facility] OR;  Service: Vascular;  Laterality: Left;  REVISON OF ARTERIOVENOUS FISTULA Left 07/16/2020  Procedure: EXCISION OF LEFT ARM ARTERIOVENOUS FISTULA;  Surgeon: Sherren Kerns, MD;  Location: [facility] OR;  Service: Vascular;  Laterality: Left;  stent in kidneys    dec 2017  TRANSTHORACIC ECHOCARDIOGRAM  09/22/2017   Severe LVH.  Normal function -EF 55-60%.  GRII DD.  Moderate RV dilation with mildly reduced RV function.   Fixed right coronary cusp with very mild aortic stenosis.  The myocardium has a  speckled appearance --> .   Recommend cardiac MRI to evaluate for amyloid  UPPER EXTREMITY VENOGRAPHY Bilateral 06/01/2020  Procedure: UPPER EXTREMITY VENOGRAPHY;  Surgeon: Chuck Hint, MD;  Location: [facility] INVASIVE CV LAB;  Service: Cardiovascular;  Laterality: Bilateral; Pati Gallo 10/09/2022, 10:11 AM  DG CHEST PORT 1 VIEW  Result Date: 10/09/2022 CLINICAL DATA:  Recent pneumonia, low O2 sats with shortness of  breath. EXAM: PORTABLE CHEST 1 VIEW COMPARISON:  Chest radiograph 10/05/2022 FINDINGS: The left-sided vascular catheter is stable with tip terminating in the right atrium. The heart is enlarged, unchanged. The upper mediastinal contours are prominent, also unchanged Lung volumes are low. There is worsened opacity in the right midlung. There is probable mild bibasilar subsegmental atelectasis. There is vascular congestion and probable mild pulmonary interstitial edema, slightly worsened. There is no significant pleural effusion there is no pneumothorax. There is no acute osseous abnormality. IMPRESSION: Cardiomegaly with vascular congestion and probable mild pulmonary interstitial edema, slightly worsened. More focal airspace opacity in the right midlung could reflect superimposed infection in the correct clinical setting. Electronically Signed   By: Lesia Hausen M.D.   On: 10/09/2022 08:44   Medications:  anticoagulant sodium citrate     ceFAZolin Stopped (10/09/22 1842)   diltiazem (CARDIZEM) infusion 10 mg/hr (10/11/22 0340)    calcitRIOL  0.5 mcg Oral Q T,Th,Sat-1800   Chlorhexidine Gluconate Cloth  6 each Topical Q0600   Darbepoetin Alfa  150 mcg Subcutaneous Q Thu-1800   feeding supplement (NEPRO CARB STEADY)  237 mL Oral BID BM   heparin  5,000 Units Subcutaneous Q8H   methocarbamol  250 mg Oral BID   midodrine  10 mg Oral TID WC   pantoprazole  40 mg Oral Daily    Outpatient Dialysis Orders: [facility]  3:45 450/A1.5x 2K/2Ca EDW 85.7kg TDC  -Heparin 3000 units IV TIW  -No ESA -Calcitriol 1.5 MCG PO three times per week  Assessment/Plan: 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 ID RX  Blood Cx 6/16 MSSA, negative Cx on 6/17, 6/19. TDC removed 6/18. Completed 72 hour line holiday. TTE concerning for endocarditis. TEE cancelled d/t high risk factors. LS MRI with possible L5-S1 discitis. Temp cath placed 6/21, but poor function requiring exchange -  Will  be getting IV Cefazolin 2g q HD x 6 weeks (until 11/04/22). 2. Deconditioning [name]: in CIR, was transferred back to 6E due to  tachycardia 3. Anemia - Hgb 8.3. +FOBT, GI -recommended PPI. Hold heparin with HD. Per primary service. Aranesp q Thursday, increased next dose to .   4. Hyponatremia: managing with HD and UF 5. ESRD: Usual TTS schedule - then line issues preventing HD 6/27 -7/1. HD terminated early Thursday, tolerated yesterday. Back on schedule today. RIJ TDC removed 6/18 - s/p line holiday, then temp line placed 6/21 with recurrent non-function issues despite exchange 6/26. S/p [facility] 7/1 in IR.  6. Perm Access: S/p L AVF ligation in 2019. Last seen by Dr. Edilia Bo 01/2022 - limited mobility in R arm so planning for L arm graft but had not scheduled surgery yet. Can revisit as an outpatient once bacteremia resolved.  7. Hypotension/volume: on midodrine. UF as tolerated.  SOB likely volume mediated based on CXR. When into sinus tachycardia during UF challenge. Still with some orthopnea and on O2. UF with HD today as tolerated, limited by tachycardia. Getting under edw, will need lower EDW on discharge.   8. 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 Binder changed to  Velphoro now at goal. Increased dose to 2 tabs PO TID AC.   9. Nutrition - Renal diet, Alb low - continue supplements    Rogers Blocker, PA-C 10/11/2022, 8:06 AM  [facility] Pager: [phone]

## 2022-10-11 NOTE — Progress Notes (Signed)
POST HD TX NOTE  10/11/22 1058  Vitals  Temp (!) 97.3 F (36.3 C)  Temp Source Oral  BP 118/71  MAP (mmHg) 85  BP Location Right Arm  BP Method Automatic  Patient Position (if appropriate) Lying  Pulse Rate 98  Pulse Rate Source Monitor  ECG Heart Rate 99  Resp (!) 27  Oxygen Therapy  SpO2 95 %  O2 Device Nasal Cannula  O2 Flow Rate (L/min) 6 L/min  Pulse Oximetry Type Continuous  During Treatment Monitoring  Dialysis Fluid Bolus (S)   (post HD tx VS check)  Post Treatment  Dialyzer Clearance Lightly streaked  Duration of HD Treatment -hour(s) 3 hour(s)  Hemodialysis Intake (mL) 100 mL (Ancef)  Liters Processed 72  Fluid Removed (mL) 2000 mL ( (value from machine) - (ancef) = )  Tolerated HD Treatment Yes  Post-Hemodialysis Comments tx completed w/o problem, UF goal met, blood rinsed back, VSS.Marland Kitchen Medication Admin: Midodrine 10mg   Hemodialysis Catheter Left Subclavian Double lumen Permanent (Tunneled)  Placement Date/Time: 09/22/22 1526   Serial / Lot #: [id number]  Expiration Date: 05/22/27  Time Out: Correct site;Correct patient;Correct procedure  Maximum sterile barrier precautions: Hand hygiene;Cap;Mask;Sterile gown;Sterile gloves;Large sterile sh...  Site Condition No complications  Blue Lumen Status Heparin locked;Dead end cap in place  Red Lumen Status Heparin locked;Dead end cap in place  Purple Lumen Status N/A  Catheter fill solution Heparin 1000 units/ml  Catheter fill volume (Arterial) 1.9 cc  Catheter fill volume (Venous) 1.9  Dressing Type Transparent  Dressing Status Antimicrobial disc in place;Clean, Dry, Intact  Drainage Description None  Dressing Change Due 10/13/22  Post treatment catheter status Capped and Clamped

## 2022-10-11 NOTE — Progress Notes (Addendum)
Pt's HR sustaining in the 170's, symptomatic with chest pain & shortness of breath. 12-lead EKG obtained showing A-fib RVR, attempted to notify Imogene Burn, MD via text page. See new orders (MAR).    Bari Edward, RN

## 2022-10-12 ENCOUNTER — Inpatient Hospital Stay (HOSPITAL_COMMUNITY): Payer: No Typology Code available for payment source

## 2022-10-12 DIAGNOSIS — I4719 Other supraventricular tachycardia: Secondary | ICD-10-CM | POA: Diagnosis not present

## 2022-10-12 DIAGNOSIS — Z452 Encounter for adjustment and management of vascular access device: Secondary | ICD-10-CM | POA: Diagnosis not present

## 2022-10-12 DIAGNOSIS — R918 Other nonspecific abnormal finding of lung field: Secondary | ICD-10-CM | POA: Diagnosis not present

## 2022-10-12 DIAGNOSIS — Z992 Dependence on renal dialysis: Secondary | ICD-10-CM | POA: Diagnosis not present

## 2022-10-12 DIAGNOSIS — J189 Pneumonia, unspecified organism: Secondary | ICD-10-CM | POA: Diagnosis not present

## 2022-10-12 DIAGNOSIS — I5033 Acute on chronic diastolic (congestive) heart failure: Secondary | ICD-10-CM | POA: Diagnosis not present

## 2022-10-12 DIAGNOSIS — J9601 Acute respiratory failure with hypoxia: Secondary | ICD-10-CM

## 2022-10-12 DIAGNOSIS — R0602 Shortness of breath: Secondary | ICD-10-CM | POA: Diagnosis not present

## 2022-10-12 DIAGNOSIS — R0603 Acute respiratory distress: Secondary | ICD-10-CM | POA: Diagnosis not present

## 2022-10-12 DIAGNOSIS — I4891 Unspecified atrial fibrillation: Secondary | ICD-10-CM | POA: Diagnosis not present

## 2022-10-12 DIAGNOSIS — E440 Moderate protein-calorie malnutrition: Secondary | ICD-10-CM | POA: Diagnosis not present

## 2022-10-12 DIAGNOSIS — I39 Endocarditis and heart valve disorders in diseases classified elsewhere: Secondary | ICD-10-CM

## 2022-10-12 DIAGNOSIS — T8612 Kidney transplant failure: Secondary | ICD-10-CM | POA: Diagnosis not present

## 2022-10-12 DIAGNOSIS — N186 End stage renal disease: Secondary | ICD-10-CM | POA: Diagnosis not present

## 2022-10-12 DIAGNOSIS — I2690 Septic pulmonary embolism without acute cor pulmonale: Secondary | ICD-10-CM | POA: Diagnosis not present

## 2022-10-12 DIAGNOSIS — J811 Chronic pulmonary edema: Secondary | ICD-10-CM | POA: Diagnosis not present

## 2022-10-12 DIAGNOSIS — I9589 Other hypotension: Secondary | ICD-10-CM | POA: Diagnosis not present

## 2022-10-12 DIAGNOSIS — I953 Hypotension of hemodialysis: Secondary | ICD-10-CM | POA: Diagnosis not present

## 2022-10-12 DIAGNOSIS — I517 Cardiomegaly: Secondary | ICD-10-CM | POA: Diagnosis not present

## 2022-10-12 DIAGNOSIS — I330 Acute and subacute infective endocarditis: Secondary | ICD-10-CM | POA: Diagnosis not present

## 2022-10-12 DIAGNOSIS — I76 Septic arterial embolism: Secondary | ICD-10-CM | POA: Diagnosis not present

## 2022-10-12 LAB — RENAL FUNCTION PANEL
Albumin: 2.1 g/dL — ABNORMAL LOW (ref 3.5–5.0)
Albumin: 2.4 g/dL — ABNORMAL LOW (ref 3.5–5.0)
Anion gap: 17 — ABNORMAL HIGH (ref 5–15)
Anion gap: 15 (ref 5–15)
BUN: 25 mg/dL — ABNORMAL HIGH (ref 8–23)
BUN: 25 mg/dL — ABNORMAL HIGH (ref 8–23)
CO2: 24 mmol/L (ref 22–32)
CO2: 22 mmol/L (ref 22–32)
Calcium: 8.9 mg/dL (ref 8.9–10.3)
Calcium: 8.7 mg/dL — ABNORMAL LOW (ref 8.9–10.3)
Chloride: 89 mmol/L — ABNORMAL LOW (ref 98–111)
Chloride: 91 mmol/L — ABNORMAL LOW (ref 98–111)
Creatinine, Ser: 4.83 mg/dL — ABNORMAL HIGH (ref 0.61–1.24)
Creatinine, Ser: 4.72 mg/dL — ABNORMAL HIGH (ref 0.61–1.24)
GFR, Estimated: 13 mL/min — ABNORMAL LOW (ref >60)
GFR, Estimated: 13 mL/min — ABNORMAL LOW (ref >60)
Glucose, Bld: 153 mg/dL — ABNORMAL HIGH (ref 70–99)
Glucose, Bld: 163 mg/dL — ABNORMAL HIGH (ref 70–99)
Phosphorus: 2.5 mg/dL (ref 2.5–4.6)
Phosphorus: 3.2 mg/dL (ref 2.5–4.6)
Potassium: 3.4 mmol/L — ABNORMAL LOW (ref 3.5–5.1)
Potassium: 3.4 mmol/L — ABNORMAL LOW (ref 3.5–5.1)
Sodium: 130 mmol/L — ABNORMAL LOW (ref 135–145)
Sodium: 128 mmol/L — ABNORMAL LOW (ref 135–145)

## 2022-10-12 LAB — CBC
HCT: 29 % — ABNORMAL LOW (ref 39.0–52.0)
HCT: 30.2 % — ABNORMAL LOW (ref 39.0–52.0)
Hemoglobin: 8.8 g/dL — ABNORMAL LOW (ref 13.0–17.0)
Hemoglobin: 9.1 g/dL — ABNORMAL LOW (ref 13.0–17.0)
MCH: 25.6 pg — ABNORMAL LOW (ref 26.0–34.0)
MCH: 25.5 pg — ABNORMAL LOW (ref 26.0–34.0)
MCHC: 30.3 g/dL (ref 30.0–36.0)
MCHC: 30.1 g/dL (ref 30.0–36.0)
MCV: 84.3 fL (ref 80.0–100.0)
MCV: 84.6 fL (ref 80.0–100.0)
Platelets: 408 10*3/uL — ABNORMAL HIGH (ref 150–400)
Platelets: 412 10*3/uL — ABNORMAL HIGH (ref 150–400)
RBC: 3.44 MIL/uL — ABNORMAL LOW (ref 4.22–5.81)
RBC: 3.57 MIL/uL — ABNORMAL LOW (ref 4.22–5.81)
RDW: 18.7 % — ABNORMAL HIGH (ref 11.5–15.5)
RDW: 18.9 % — ABNORMAL HIGH (ref 11.5–15.5)
WBC: 16.3 10*3/uL — ABNORMAL HIGH (ref 4.0–10.5)
WBC: 16.7 10*3/uL — ABNORMAL HIGH (ref 4.0–10.5)
nRBC: 3 % — ABNORMAL HIGH (ref 0.0–0.2)
nRBC: 2.1 % — ABNORMAL HIGH (ref 0.0–0.2)

## 2022-10-12 LAB — BASIC METABOLIC PANEL
Anion gap: 11 (ref 5–15)
BUN: 22 mg/dL (ref 8–23)
CO2: 23 mmol/L (ref 22–32)
Calcium: 8.5 mg/dL — ABNORMAL LOW (ref 8.9–10.3)
Chloride: 93 mmol/L — ABNORMAL LOW (ref 98–111)
Creatinine, Ser: 4.06 mg/dL — ABNORMAL HIGH (ref 0.61–1.24)
GFR, Estimated: 16 mL/min — ABNORMAL LOW (ref >60)
Glucose, Bld: 124 mg/dL — ABNORMAL HIGH (ref 70–99)
Potassium: 3.3 mmol/L — ABNORMAL LOW (ref 3.5–5.1)
Sodium: 127 mmol/L — ABNORMAL LOW (ref 135–145)

## 2022-10-12 LAB — PHOSPHORUS: Phosphorus: 2.4 mg/dL — ABNORMAL LOW (ref 2.5–4.6)

## 2022-10-12 LAB — MRSA NEXT GEN BY PCR, NASAL: MRSA by PCR Next Gen: NOT DETECTED

## 2022-10-12 LAB — MAGNESIUM: Magnesium: 1.6 mg/dL — ABNORMAL LOW (ref 1.7–2.4)

## 2022-10-12 MED ORDER — LORAZEPAM 2 MG/ML IJ SOLN: 0.5000 mg | Freq: Once | INTRAMUSCULAR | Status: DC

## 2022-10-12 MED ORDER — PRISMASOL BGK 4/2.5 32-4-2.5 MEQ/L REPLACEMENT SOLN: Status: DC

## 2022-10-12 MED ORDER — CEFAZOLIN SODIUM-DEXTROSE 2-4 GM/100ML-% IV SOLN
2.0000 g | Freq: Two times a day (BID) | INTRAVENOUS | Status: DC
  Administered 2022-10-12 – 2022-10-15 (×6): 2 g via INTRAVENOUS
  Filled 2022-10-12 (×6): qty 100

## 2022-10-12 MED ORDER — MORPHINE SULFATE (PF) 2 MG/ML IV SOLN
INTRAVENOUS | Status: AC
  Filled 2022-10-12: qty 1

## 2022-10-12 MED ORDER — CHLORHEXIDINE GLUCONATE CLOTH 2 % EX PADS
6.0000 | MEDICATED_PAD | Freq: Every day | CUTANEOUS | Status: DC
  Administered 2022-10-12: 6 via TOPICAL

## 2022-10-12 MED ORDER — SODIUM CHLORIDE 0.9 % IV SOLN
500.0000 [IU]/h | INTRAVENOUS | Status: DC
  Administered 2022-10-12: 500 [IU]/h via INTRAVENOUS_CENTRAL
  Administered 2022-10-13 – 2022-10-15 (×4): 750 [IU]/h via INTRAVENOUS_CENTRAL
  Filled 2022-10-12 (×3): qty 10000
  Filled 2022-10-12: qty 2
  Filled 2022-10-12: qty 10000

## 2022-10-12 MED ORDER — ORAL CARE MOUTH RINSE
15.0000 mL | OROMUCOSAL | Status: DC
  Administered 2022-10-12 – 2022-10-28 (×47): 15 mL via OROMUCOSAL

## 2022-10-12 MED ORDER — HEPARIN SODIUM (PORCINE) 1000 UNIT/ML IJ SOLN
INTRAMUSCULAR | Status: AC
  Filled 2022-10-12: qty 4

## 2022-10-12 MED ORDER — CLONAZEPAM 0.5 MG PO TABS
0.5000 mg | ORAL_TABLET | Freq: Once | ORAL | Status: AC
  Administered 2022-10-12: 0.5 mg via ORAL
  Filled 2022-10-12: qty 1

## 2022-10-12 MED ORDER — ALBUMIN HUMAN 25 % IV SOLN
INTRAVENOUS | Status: AC
  Filled 2022-10-12: qty 200

## 2022-10-12 MED ORDER — PRISMASOL BGK 4/2.5 32-4-2.5 MEQ/L EC SOLN: Status: DC

## 2022-10-12 MED ORDER — SODIUM CHLORIDE 0.9 % IV SOLN: INTRAVENOUS | Status: DC | PRN

## 2022-10-12 MED ORDER — DEXMEDETOMIDINE HCL IN NACL 400 MCG/100ML IV SOLN
0.0000 ug/kg/h | INTRAVENOUS | Status: DC
  Administered 2022-10-12: 0.1 ug/kg/h via INTRAVENOUS
  Administered 2022-10-13 – 2022-10-14 (×2): 0.2 ug/kg/h via INTRAVENOUS
  Filled 2022-10-12 (×3): qty 100

## 2022-10-12 MED ORDER — ORAL CARE MOUTH RINSE: 15.0000 mL | OROMUCOSAL | Status: DC | PRN

## 2022-10-12 MED ORDER — METOPROLOL TARTRATE 25 MG PO TABS: 25.0000 mg | ORAL_TABLET | Freq: Two times a day (BID) | ORAL | Status: DC

## 2022-10-12 MED ORDER — METOPROLOL TARTRATE 5 MG/5ML IV SOLN
5.0000 mg | Freq: Once | INTRAVENOUS | Status: AC
  Administered 2022-10-12: 5 mg via INTRAVENOUS

## 2022-10-12 MED ORDER — HEPARIN SODIUM (PORCINE) 1000 UNIT/ML DIALYSIS
1000.0000 [IU] | INTRAMUSCULAR | Status: DC | PRN
  Filled 2022-10-12: qty 4

## 2022-10-12 MED ORDER — IPRATROPIUM-ALBUTEROL 0.5-2.5 (3) MG/3ML IN SOLN: 3.0000 mL | RESPIRATORY_TRACT | Status: AC

## 2022-10-12 MED ORDER — LORAZEPAM 2 MG/ML IJ SOLN
INTRAMUSCULAR | Status: AC
  Filled 2022-10-12: qty 1

## 2022-10-12 MED ORDER — MAGNESIUM SULFATE 2 GM/50ML IV SOLN
2.0000 g | Freq: Once | INTRAVENOUS | Status: AC
  Administered 2022-10-12: 2 g via INTRAVENOUS
  Filled 2022-10-12: qty 50

## 2022-10-12 MED ORDER — METOPROLOL TARTRATE 25 MG PO TABS
25.0000 mg | ORAL_TABLET | Freq: Three times a day (TID) | ORAL | Status: DC
  Administered 2022-10-12 – 2022-10-13 (×4): 25 mg via ORAL
  Filled 2022-10-12 (×4): qty 1

## 2022-10-12 MED ORDER — MORPHINE SULFATE (PF) 2 MG/ML IV SOLN
0.5000 mg | Freq: Once | INTRAVENOUS | Status: AC
  Administered 2022-10-12: 0.5 mg via INTRAVENOUS

## 2022-10-12 MED ORDER — TRAZODONE HCL 50 MG PO TABS
50.0000 mg | ORAL_TABLET | Freq: Every evening | ORAL | Status: DC | PRN
  Administered 2022-10-12 – 2022-11-02 (×15): 50 mg via ORAL
  Filled 2022-10-12 (×17): qty 1

## 2022-10-12 NOTE — Progress Notes (Signed)
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  He is high risk for intubation and has been on BiPAP with persistent dyspnea.  When he is more awake, his anxiety drives him to be more tachypneic.  Respiratory rate varying from 30-40.  On continuous renal replacement therapy.    eICU Interventions  Will try to use Precedex to keep him calm, but if he becomes hypoxemic, will need to evaluate for intubation.  Has mild leak with current BiPAP settings and saturating 96%-hold on changing any BiPAP settings for now.  Is on concurrent diltiazem, may need to titrate this off if the patient becomes hypotensive or bradycardic.     Intervention Category Intermediate Interventions: Respiratory distress - evaluation and management  Tasmin Exantus 10/12/2022, 8:15 PM

## 2022-10-12 NOTE — Care Plan (Signed)
Patient did not tolerate hemodialysis as he became tachycardic and severely short of breath.   PCCM consulted.  Patient started on BiPAP with improvement of heart rate. Nephrology notified.  Patient is going to get CRRT in the ICU. Patient is transferred to ICU.

## 2022-10-12 NOTE — Progress Notes (Signed)
   10/12/22 1915  BiPAP/CPAP/SIPAP  BiPAP/CPAP/SIPAP Pt Type Adult  BiPAP/CPAP/SIPAP V60  Mask Type Full face mask  Mask Size Medium  Set Rate 10 breaths/min  Respiratory Rate 35 breaths/min  IPAP 14 cmH20  EPAP 5 cmH2O  FiO2 (%) 50 %  Flow Rate 0.8 lpm  Minute Ventilation 18.7  Leak 9  Peak Inspiratory Pressure (PIP) 12  Tidal Volume (Vt) 718  Press High Alarm 25 cmH2O

## 2022-10-12 NOTE — Progress Notes (Signed)
PHARMACY NOTE:  ANTIMICROBIAL RENAL DOSAGE ADJUSTMENT  Current antimicrobial regimen includes a mismatch between antimicrobial dosage and estimated renal function.  As per policy approved by the Pharmacy & Therapeutics and Medical Executive Committees, the antimicrobial dosage will be adjusted accordingly.  Current antimicrobial dosage: IV cefazolin 2g T-Th-Sa  Indication: osteomyelitis of spine and MSSA bacteremia w/ MV endocarditis  Renal Function:  Estimated Creatinine Clearance: 16.1 mL/min (A) (by C-G formula based on SCr of 4.83 mg/dL (H)). []      On intermittent HD, scheduled: [x]      On CRRT    Antimicrobial dosage has been changed to:  IV cefazolin 2g every 12 hours  Additional comments:  Patient was not tolerating HD and was switched to CRRT. Has received 3 doses of cefazolin since admission 7/18.  Thank you for allowing pharmacy to be a part of this patient's care.   Stephenie Acres, PharmD PGY1 Pharmacy Resident 10/12/2022 3:34 PM

## 2022-10-12 NOTE — Progress Notes (Signed)
Nephrology attending:  Event noted. Patient didn't tolerated HD as he became more dyspneic and tachycardic. Evaluated by PCCM and moved to ICU. He is currently on bipap w/improvement of HR.   Plan to start CRRT, 4K, fixed dose haparin, TDC for access and UF goal 50-100 cc/hr.  D/w Dr. Garnet Sierras w/PCCM.  Crista Elliot, MD [facility].

## 2022-10-12 NOTE — Progress Notes (Signed)
Pt's K+ 3.3 & Mag 1.6; attempted to notify Imogene Burn, MD. Awaiting further orders.  Bari Edward, RN

## 2022-10-12 NOTE — Plan of Care (Signed)
N: pt a+o x4, no complaints of pain  CV: SR since admit to 2H, PO BB and Cardizem gtt to wean  Pulm: BiPAP GI/GU: Renal diet, 1200 fluid restriction. CRRT (-50-100) via THDC  Problem: Education: Goal: Knowledge of General Education information will improve Description: Including pain rating scale, medication(s)/side effects and non-pharmacologic comfort measures Outcome: Progressing   Problem: Health Behavior/Discharge Planning: Goal: Ability to manage health-related needs will improve Outcome: Progressing   Problem: Clinical Measurements: Goal: Ability to maintain clinical measurements within normal limits will improve Outcome: Progressing Goal: Will remain free from infection Outcome: Progressing Goal: Diagnostic test results will improve Outcome: Progressing Goal: Respiratory complications will improve Outcome: Progressing Goal: Cardiovascular complication will be avoided Outcome: Progressing   Problem: Activity: Goal: Risk for activity intolerance will decrease Outcome: Progressing   Problem: Nutrition: Goal: Adequate nutrition will be maintained Outcome: Progressing   Problem: Coping: Goal: Level of anxiety will decrease Outcome: Progressing   Problem: Elimination: Goal: Will not experience complications related to bowel motility Outcome: Progressing Goal: Will not experience complications related to urinary retention Outcome: Progressing   Problem: Pain Managment: Goal: General experience of comfort will improve Outcome: Progressing   Problem: Safety: Goal: Ability to remain free from injury will improve Outcome: Progressing   Problem: Skin Integrity: Goal: Risk for impaired skin integrity will decrease Outcome: Progressing   Problem: Education: Goal: Knowledge of General Education information will improve Description: Including pain rating scale, medication(s)/side effects and non-pharmacologic comfort measures Outcome: Progressing   Problem: Health  Behavior/Discharge Planning: Goal: Ability to manage health-related needs will improve Outcome: Progressing   Problem: Clinical Measurements: Goal: Ability to maintain clinical measurements within normal limits will improve Outcome: Progressing Goal: Will remain free from infection Outcome: Progressing Goal: Diagnostic test results will improve Outcome: Progressing Goal: Respiratory complications will improve Outcome: Progressing Goal: Cardiovascular complication will be avoided Outcome: Progressing   Problem: Activity: Goal: Risk for activity intolerance will decrease Outcome: Progressing   Problem: Nutrition: Goal: Adequate nutrition will be maintained Outcome: Progressing   Problem: Coping: Goal: Level of anxiety will decrease Outcome: Progressing   Problem: Elimination: Goal: Will not experience complications related to bowel motility Outcome: Progressing Goal: Will not experience complications related to urinary retention Outcome: Progressing   Problem: Pain Managment: Goal: General experience of comfort will improve Outcome: Progressing   Problem: Safety: Goal: Ability to remain free from injury will improve Outcome: Progressing   Problem: Skin Integrity: Goal: Risk for impaired skin integrity will decrease Outcome: Progressing

## 2022-10-12 NOTE — Progress Notes (Signed)
HD nurse at the bedside, called out reporting patient was short of breath. Came to the room to find patient restless, SOB, complaining of chest tightness and his "heart racing". Patients O2 sats 85% on 4L [place]. Placed patient on non-rebreather and gave PRN duoneb.  Notified rapid response RN and MD  RT and CCM at the bedside,  verbal order to give 0.5 morphine. RT placed pt on bipap.   Report called to 2H and patient was transferred.  Attempted to update patient's daughter, Tonye Royalty, per his request but unable to reach her at this time.

## 2022-10-12 NOTE — Progress Notes (Signed)
Pt converted to sinus rhythm this a.m; 12-lead EKG obtained to confirm. Attempted to notify Idelle Leech, MD.  Bari Edward, RN

## 2022-10-12 NOTE — Progress Notes (Signed)
PROGRESS NOTE    Ryan [name]  LKG:[id number] DOB: 12/22/1960 DOA: 10/09/2022 PCP: Sharin Grave, MD    Brief Narrative:  This 62 y.o. male with medical history significant of HTN, HLD, ESRD on hemodialysis being admitted from CIR for concern about new onset atrial fibrillation with RVR.   Patient initially presented to the hospital on 6/6 with lower back pain and ultimately found to have evidence of L5/S1 discitis and osteomyelitis as well as MSSA bacteremia with mitral valve endocarditis. CT scan of the chest noted septic emboli with new airspace opacities in the left upper lobe . MRI brain noting scattered subcentimeter infarcts involving the bilateral cerebral hemisphere and right cerebellum. CT surgery has been consulted but recommended conservative management as. He was already responding to antibiotics. Plan was for patient to continue on antibiotics at cefazolin until 8/13 per ID recommendations. Hospital course had also been complicated by small bowel obstruction which has since resolved. Patient had been discharged to inpatient rehab on 7/4.    While in rehab medicine team was consulted on 7/11 for cough and dyspnea.  Eventually patient was diagnosed with concerns of pneumonia/aspiration therefore was on 5 days of Zosyn and also diagnosed of fluid overload being managed with hemodialysis.  He also reported of dysphagia and had drop in hemoglobin of 6.1 with Hemoccult positive stool.  Patient was seen by GI, esophagram performed which was negative for any acute pathology.   While getting hemodialysis on 7/18 patient went into atrial fibrillation with RVR therefore being admitted to the hospital for closer monitoring.   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  It was thought as New onset Atrial Fibrillation. Cardiology consulted. It is bot atrial fibrillation, Continue IV as needed Lopressor for now.   No need for Anticoagulation.   Acute respiratory distress: - Combination of fluid overload and recovering from pneumonia -Completing course of Zosyn, will transition back to cefazolin.  Last day of cefazolin is 8/13.   - Continue As needed bronchodilators, I-S/flutter valve.   Acute diastolic congestive heart failure, class III: -Recent echocardiogram showed EF of 60%.  Patient does not produce any urine.   -Volume status will be managed by hemodialysis. HD oday   Dysphagia - Have esophagram which was negative.  Speech and swallow recommending D3 diet.   Anemia of chronic disease - Baseline hemoglobin 8.0 which has drifted down to 6.1 requiring 1 unit PRBC transfusion on 7/15.   Continue PPI.  [facility] GI following.   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  Plan the last day 8/13     DVT prophylaxis: Heparin sq Code Status:Full code Family Communication:No family Disposition Plan:    Status is: Inpatient Remains inpatient appropriate because: Continued inpatient workup.     Consultants:  Cardiology Nephrology  Procedures:  Antimicrobials: Anti-infectives (From admission, onward)    Start     Dose/Rate Route Frequency Ordered Stop   10/09/22 1800  ceFAZolin (ANCEF)  IVPB 2g/100 mL premix        2 g 200 mL/hr over 30 Minutes Intravenous Every T-Th-Sa (1800) 10/09/22 1619         Subjective: Patient was seen and examined at bed side, overnight events noted. Patient  reports doing fine, Asking for sleeping medication at bedtime. Patient is going to have hemodialysis today.  Objective: Vitals:   10/12/22 1350 10/12/22 1423 10/12/22 1457 10/12/22 1500  BP:   132/66 102/69  Pulse: (!) 110 (!) 118 (!) 118   Resp: (!) 37 (!) 42 (!) 23 (!) 30  Temp:      TempSrc:      SpO2: 97%  100% 95%  Weight:      Height:        Intake/Output Summary (Last 24 hours) at 10/12/2022 1526 Last data filed at 10/12/2022 1350 Gross per 24 hour  Intake 1371.24 ml  Output 0 ml  Net 1371.24 ml   Filed Weights   10/11/22 0732 10/11/22 1058 10/12/22 0504  Weight: 79.1 kg 78.8 kg 81 kg    Examination:  General exam: Appears comfortable, deconditioned, not in any acute distress. Respiratory system: Clear to auscultation. Respiratory effort normal. RR 12, Left port left upper chest. Cardiovascular system: S1 & S2 heard, RRR. No JVD, murmurs, rubs, gallops or clicks.  Gastrointestinal system: Abdomen is nondistended, soft and nontender. Normal bowel sounds heard. Central nervous system: Alert and oriented x3. No focal neurological deficits. Extremities: No edema, no cyanosis, no clubbing Skin: No rashes, lesions or ulcers Psychiatry: Judgement and insight appear normal. Mood & affect appropriate.     Data Reviewed: I have personally reviewed following labs and imaging studies  CBC: Recent Labs  Lab 10/05/22 2023 10/06/22 0637 10/07/22 0548 10/08/22 0605 10/09/22 1802 10/10/22 1300 10/11/22 0711 10/12/22 0251 10/12/22 1330  WBC 14.7* 13.8* 12.9*   < > 13.9* 14.0* 15.1* 16.3* 16.7*  NEUTROABS 12.3* 11.1* 10.3*  --   --   --   --   --   --   HGB 7.0* 6.3* 6.1*   < > 8.3* 7.9* 8.3* 8.8* 9.1*  HCT 22.8* 21.3* 20.0*   < > 26.4* 25.8* 27.6* 29.0* 30.2*  MCV 83.2 83.5 82.6   < > 82.8 83.0 84.1 84.3 84.6  PLT 344 321 358   < > 407* 363 369 408* 412*   < > = values in this interval not displayed.   Basic Metabolic Panel: Recent Labs  Lab 10/09/22 1805  10/10/22 1300 10/10/22 1829 10/11/22 0246 10/12/22 0251 10/12/22 1330  NA 128* 126* 130* 129* 127* 130*  K 3.8 3.8 3.6 3.9 3.3* 3.4*  CL 89* 90* 94* 90* 93* 89*  CO2 24 23 23  21* 23 24  GLUCOSE 122* 114* 98 107* 124* 153*  BUN 25* 32* 16 21 22  25*  CREATININE 5.13* 6.50* 3.90* 4.59* 4.06* 4.83*  CALCIUM 8.2* 8.3* 8.4* 8.5* 8.5* 8.9  MG  --   --   --  1.6* 1.6*  --   PHOS 3.2 4.4 2.9  --  2.4* 2.5   GFR: Estimated Creatinine Clearance: 16.1 mL/min (A) (by C-G formula based on SCr of 4.83 mg/dL (H)). Liver Function Tests: Recent Labs  Lab 10/09/22 1138 10/09/22 1805 10/10/22 1300 10/10/22 1829 10/12/22 1330  AST  --   --  41  --   --   ALT  --   --  7  --   --   ALKPHOS  --   --  107  --   --   BILITOT  --   --  0.7  --   --   PROT  --   --  7.6  --   --   ALBUMIN 1.9* 2.0* 2.0* 2.0* 2.1*   No results for input(s): "LIPASE", "AMYLASE" in the last 168 hours. No results for input(s): "AMMONIA" in the last 168 hours. Coagulation Profile: Recent Labs  Lab 10/10/22 1829  INR 1.4*   Cardiac Enzymes: No results for input(s): "CKTOTAL", "CKMB", "CKMBINDEX", "TROPONINI" in the last 168 hours. BNP (last 3 results) No results for input(s): "PROBNP" in the last 8760 hours. HbA1C: No results for input(s): "HGBA1C" in the last 72 hours. CBG: No results for input(s): "GLUCAP" in the last 168 hours. Lipid Profile: No results for input(s): "CHOL", "HDL", "LDLCALC", "TRIG", "CHOLHDL", "LDLDIRECT" in the last 72 hours. Thyroid Function Tests: Recent Labs    10/09/22 1802  TSH 8.976*   Anemia Panel: No results for input(s): "VITAMINB12", "FOLATE", "FERRITIN", "TIBC", "IRON", "RETICCTPCT" in the last 72 hours. Sepsis Labs: Recent Labs  Lab 10/05/22 2023 10/05/22 2257  PROCALCITON 5.04  --   LATICACIDVEN 1.3 1.0    Recent Results (from the past 240 hour(s))  Culture, blood (Routine X 2) w Reflex to ID Panel     Status: None   Collection Time: 10/05/22  8:23 PM    Specimen: BLOOD  Result Value Ref Range Status   Specimen Description BLOOD BLOOD RIGHT HAND  Final   Special Requests   Final    BOTTLES DRAWN AEROBIC AND ANAEROBIC Blood Culture adequate volume   Culture   Final    NO GROWTH 5 DAYS Performed at [facility], [address] [place], [place] [address]    Report Status 10/10/2022 FINAL  Final  Culture, blood (Routine X 2) w Reflex to ID Panel     Status: None   Collection Time: 10/05/22  8:32 PM   Specimen: BLOOD  Result Value Ref Range Status   Specimen Description BLOOD BLOOD RIGHT ARM  Final   Special Requests   Final    BOTTLES DRAWN AEROBIC AND ANAEROBIC Blood Culture adequate volume   Culture   Final    NO GROWTH 5 DAYS Performed at [facility], [address] [place], [place] [address]    Report Status 10/10/2022 FINAL  Final  Respiratory (~20 pathogens) panel by PCR     Status: None   Collection Time: 10/06/22  1:58 AM   Specimen: Nasopharyngeal Swab; Respiratory  Result Value Ref Range Status   Adenovirus NOT DETECTED NOT DETECTED Final   Coronavirus 229E NOT DETECTED NOT DETECTED Final    Comment: (NOTE) The Coronavirus on the Respiratory Panel, DOES NOT test for the novel  Coronavirus (2019 nCoV)    Coronavirus HKU1 NOT DETECTED NOT DETECTED Final   Coronavirus NL63 NOT DETECTED NOT DETECTED Final   Coronavirus OC43 NOT DETECTED NOT DETECTED Final   Metapneumovirus NOT DETECTED NOT DETECTED Final   Rhinovirus / Enterovirus NOT DETECTED NOT DETECTED Final   Influenza A NOT DETECTED NOT DETECTED Final   Influenza B NOT DETECTED NOT DETECTED Final   Parainfluenza Virus 1 NOT DETECTED NOT DETECTED Final   Parainfluenza Virus 2 NOT DETECTED NOT DETECTED Final   Parainfluenza Virus 3 NOT DETECTED NOT DETECTED Final   Parainfluenza Virus 4 NOT DETECTED NOT DETECTED Final   Respiratory Syncytial Virus NOT DETECTED NOT DETECTED Final   Bordetella pertussis NOT DETECTED  NOT DETECTED Final   Bordetella  Parapertussis NOT DETECTED NOT DETECTED Final   Chlamydophila pneumoniae NOT DETECTED NOT DETECTED Final   Mycoplasma pneumoniae NOT DETECTED NOT DETECTED Final    Comment: Performed at [facility], [address], [place], [place] [address]  SARS Coronavirus 2 by RT PCR (hospital order, performed in [facility] hospital lab) *cepheid single result test* Anterior Nasal Swab     Status: None   Collection Time: 10/06/22  1:58 AM   Specimen: Anterior Nasal Swab  Result Value Ref Range Status   SARS Coronavirus 2 by RT PCR NEGATIVE NEGATIVE Final    Comment: Performed at [facility] Lab, [address] [place], [place] [address]    Radiology Studies: DG CHEST PORT 1 VIEW  Result Date: 10/12/2022 CLINICAL DATA:  10026 Shortness of breath 10026 EXAM: PORTABLE CHEST - 1 VIEW COMPARISON:  10/09/2022 FINDINGS: Stable left IJ hemodialysis catheter. Slight improvement in the alveolar opacities seen previously, with residual airspace opacities in the right mid lung and left lung base. Heart size and mediastinal contours are within normal limits. Visualized bones unremarkable. IMPRESSION: Improving pulmonary edema. Electronically Signed   By: Corlis Leak M.D.   On: 10/12/2022 09:21     Scheduled Meds:  calcitRIOL  0.5 mcg Oral Q T,Th,Sat-1800   Chlorhexidine Gluconate Cloth  6 each Topical Q0600   Chlorhexidine Gluconate Cloth  6 each Topical Q0600   Darbepoetin Alfa  150 mcg Subcutaneous Q Thu-1800   feeding supplement (NEPRO CARB STEADY)  237 mL Oral BID BM   heparin  5,000 Units Subcutaneous Q8H   heparin sodium (porcine)       ipratropium-albuterol  3 mL Nebulization STAT   LORazepam       methocarbamol  250 mg Oral BID   midodrine  10 mg Oral TID WC   morphine (PF)       pantoprazole  40 mg Oral Daily   Continuous Infusions:   prismasol BGK 4/2.5      prismasol BGK 4/2.5     albumin human     ceFAZolin Stopped (10/11/22 2047)   diltiazem (CARDIZEM) infusion 10 mg/hr (10/12/22  1504)   heparin 10,000 units/ 20 mL infusion syringe     prismasol BGK 4/2.5       LOS: 3 days    Time spent: 35 mins    Willeen Niece, MD [facility]   If 7PM-7AM, please contact night-coverage

## 2022-10-12 NOTE — Progress Notes (Addendum)
Received patient in bed.Awake ,alert and oriented x 4.SOB at rest while semi fowlers in bed.Consent verified.  Medicine given : Albumin 25 g .  Access used : Left upper HD catheter.Dressing on date.  Duration of treatment 10 minutes out of 3.5 hours prescribed.  Fluid removed : None, positive 200 cc from the initial treatment.  Hemo comment: Patient having Sob at rest,anxious.Marland KitchenSBP were very soft .Renal P.A  made aware. Order done and carried out.After 8 minutes of initiating treatment ,patient complaint of chest thighness,HD nurse assessed,asking patient if he was having chest pain,patient responded ,''no ,just short of breath''.hd nurse hold the UF ,used call bell to call patient's nurse,she did come immediately..HD nurse verbalized concern for patient and requested rapid response,patient was getting restless ,agitated.HD nurse stopped treatment and called Renal P.A. order done and carried out.Patient disconnected from HD machine as per verbal ordered..Unit's nurses at the bedside,rapid response at the bedside. All throughout this ,patient is conscious.Patient's PMD arrived and relayed message to him from the renal team.  Hand off to the patient's nurse.

## 2022-10-12 NOTE — Progress Notes (Signed)
Called by RR for patient in respiratory distress. Upon arrival to room Duoneb had been given and second starting. Started BIPAP, CCM at bedside. Patient will transfer to ICU.  Patient states he is feeling much better with BIPAP. HR 114 RR 28 99%

## 2022-10-12 NOTE — Significant Event (Signed)
Rapid Response Event Note   Reason for Call :  Acute respiratory distress  Initial Focused Assessment:  Increased work of breathing, significant distress, air hungry HR 120  RR 40-50s  BP  150/110  O2 sat 88% Lung sounds wheezy, diminished Heart tones irregular, on Cardizem gtt at 15cc/hr   Interventions:  NRB  O2 sat 95% Duoneb x 2  Now able to hear more air movement, wheezes through out and crackles  0.5mg  Morphine IV Bipap  Dr Merrily Pew at bedside to assess patient More calm but still labored and RR 40-50s 5mg  Lopressor IV  Transferred to 2N56  Plan of Care:     Event Summary:   MD Notified:  Willeen Niece Call Time: 1339 Arrival Time: 1343 End Time: 1455  Marcellina Millin, RN

## 2022-10-12 NOTE — Consult Note (Signed)
NAME:  Ryan [name], MRN:  [id number], DOB:  March 01, 1961, LOS: 3 ADMISSION DATE:  10/09/2022, CONSULTATION DATE:  7/1 REFERRING MD:  Dr. Rhona Leavens, CHIEF COMPLAINT:  SOB   History of Present Illness:  62 year old male with past medical history as below, which is significant for hypertension, hyperlipidemia, and ESRD on dialysis.  He presented most emergency department on 6/16 with complaints of low back pain.  Workup was unfortunately significant for L5-S1 discitis/osteomyelitis in addition MSSA bacteremia.  ID was consulted and has been managing antibiotics and infectious workup.  Patient has been treated with cefazolin IV. There is concern on transthoracic echocardiogram for a large rounded structure on the anterior leaflet of the mitral valve.  Chest and brain imaging unfortunately consistent with septic embolization to both areas.  The appropriate specialties were involved and all opted to continue with conservative treatment of antibiotics for now.  He had been getting dialyzed through tunneled right IJ catheter which was removed for line holiday.  This was replaced by temporary femoral access by IR, however, this access became inoperable complicating dialysis session on 6/29 which was unable to be done.  Since that time the patient has become progressively short of breath intermittently requiring BiPAP.  This was mostly felt to be due to volume overload having missed dialysis, however, chest x-ray did show some changes consistent with cavitary lesions.   Today patient was supposed to get hemodialysis but he went into A-fib with RVR, started getting short of breath, hemodialysis was kept on hold.  He was noted to be tachypneic, hypoxic, PCCM was consulted for help evaluation medical management  Pertinent  Medical History   has a past medical history of Allergy, Anemia, Blood transfusion without reported diagnosis, Dialysis patient (HCC), ESRD (end stage renal disease) (HCC), Family history of adverse  reaction to anesthesia, Hyperlipidemia, and Hypertension.   Significant Hospital Events: Including procedures, antibiotic start and stop dates in addition to other pertinent events     Interim History / Subjective:   As above   Objective   Blood pressure 102/69, pulse (!) 118, temperature 97.7 F (36.5 C), temperature source Oral, resp. rate (!) 30, height 5\' 9"  (1.753 m), weight 81 kg, SpO2 95%.    FiO2 (%):  [50 %-60 %] 50 %   Intake/Output Summary (Last 24 hours) at 10/12/2022 1529 Last data filed at 10/12/2022 1350 Gross per 24 hour  Intake 1371.24 ml  Output 0 ml  Net 1371.24 ml   Filed Weights   10/11/22 0732 10/11/22 1058 10/12/22 0504  Weight: 79.1 kg 78.8 kg 81 kg    Examination: General: Crtitically ill-appearing male, on Bipap HEENT: [place]/AT, eyes anicteric.  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 25 mg 3 times daily Completed antibiotic therapy with Zosyn Continue IV cefazolin ID recommend to continue antibiotic until 8/13 Transferred  to ICU, high risk of endotracheal intubation  Best Practice (right click and "Reselect all SmartList Selections" daily)     Labs   CBC: Recent Labs  Lab  10/05/22 2023 10/06/22 0637 10/07/22 0548 10/08/22 0605 10/09/22 1802 10/10/22 1300 10/11/22 0711 10/12/22 0251 10/12/22 1330  WBC 14.7* 13.8* 12.9*   < > 13.9* 14.0* 15.1* 16.3* 16.7*  NEUTROABS 12.3* 11.1* 10.3*  --   --   --   --   --   --   HGB 7.0* 6.3* 6.1*   < > 8.3* 7.9* 8.3* 8.8* 9.1*  HCT 22.8* 21.3* 20.0*   < > 26.4* 25.8* 27.6* 29.0* 30.2*  MCV 83.2 83.5 82.6   < > 82.8 83.0 84.1 84.3 84.6  PLT 344 321 358   < > 407* 363 369 408* 412*   < > = values in this interval not displayed.    Basic Metabolic Panel: Recent Labs  Lab 10/09/22 1805 10/10/22 1300 10/10/22 1829 10/11/22 0246 10/12/22 0251 10/12/22 1330  NA 128* 126* 130* 129* 127* 130*  K 3.8 3.8 3.6 3.9 3.3* 3.4*  CL 89* 90* 94* 90* 93* 89*  CO2 24 23 23  21* 23 24  GLUCOSE 122* 114* 98 107* 124* 153*  BUN 25* 32* 16 21 22  25*  CREATININE 5.13* 6.50* 3.90* 4.59* 4.06* 4.83*  CALCIUM 8.2* 8.3* 8.4* 8.5* 8.5* 8.9  MG  --   --   --  1.6* 1.6*  --   PHOS 3.2 4.4 2.9  --  2.4* 2.5   GFR: Estimated Creatinine Clearance: 16.1 mL/min (A) (by C-G formula based on SCr of 4.83 mg/dL (H)). Recent Labs  Lab 10/05/22 2023 10/05/22 2257 10/06/22 0637 10/10/22 1300 10/11/22 0711 10/12/22 0251 10/12/22 1330  PROCALCITON 5.04  --   --   --   --   --   --   WBC 14.7*  --    < > 14.0* 15.1* 16.3* 16.7*  LATICACIDVEN 1.3 1.0  --   --   --   --   --    < > = values in this interval not displayed.    Liver Function Tests: Recent Labs  Lab 10/09/22 1138 10/09/22 1805 10/10/22 1300 10/10/22 1829 10/12/22 1330  AST  --   --  41  --   --   ALT  --   --  7  --   --   ALKPHOS  --   --  107  --   --   BILITOT  --   --  0.7  --   --   PROT  --   --  7.6  --   --   ALBUMIN 1.9* 2.0* 2.0* 2.0* 2.1*   No results for input(s): "LIPASE", "AMYLASE" in the last 168 hours. No results for input(s): "AMMONIA" in the last 168 hours.  ABG    Component Value Date/Time   HCO3 10.9 (L) 03/03/2016 2015   TCO2 28  08/27/2020 0658   ACIDBASEDEF 15.0 (H) 03/03/2016 2015   O2SAT 69.0 03/03/2016 2015     Coagulation Profile: Recent Labs  Lab 10/10/22 1829  INR 1.4*    Cardiac Enzymes: No results for input(s): "CKTOTAL", "CKMB", "CKMBINDEX", "TROPONINI" in the last 168 hours.  HbA1C: Hgb A1c MFr Bld  Date/Time Value Ref Range Status  09/19/2022 04:24 AM 6.3 (H) 4.8 - 5.6 % Final    Comment:    (NOTE)         Prediabetes:  5.7 - 6.4         Diabetes: >6.4         Glycemic control for adults with diabetes: <7.0     CBG: No results for input(s): "GLUCAP" in the last 168 hours.  Review of Systems:   12 point review of systems significant for complaint mentioned HPI, rest is negative   Past Medical History:  He,  has a past medical history of Allergy, Anemia, Blood transfusion without reported diagnosis, Dialysis patient (HCC), ESRD (end stage renal disease) (HCC), Family history of adverse reaction to anesthesia, Hyperlipidemia, and Hypertension.   Surgical History:   Past Surgical History:  Procedure Laterality Date   A/V FISTULAGRAM Left 12/15/2016   Procedure: A/V Fistulagram - left;  Surgeon: Chuck Hint, MD;  Location: [facility] INVASIVE CV LAB;  Service: Cardiovascular;  Laterality: Left;   AV FISTULA PLACEMENT Left 08/28/2016   Procedure: LEFT UPPER  ARM ARTERIOVENOUS (AV) FISTULA CREATION;  Surgeon: Chuck Hint, MD;  Location: [facility] OR;  Service: Vascular;  Laterality: Left;   DIALYSIS/PERMA CATHETER INSERTION Right 12/21/2017   Procedure: INSERTION OF DIALYSIS CATHETER Right Internal Jugular .;  Surgeon: Sherren Kerns, MD;  Location: [facility] OR;  Service: Vascular;  Laterality: Right;   FISTULA SUPERFICIALIZATION Left 09/23/2017   Procedure: FISTULA PLICATION LEFT ARM;  Surgeon: Sherren Kerns, MD;  Location: [facility] OR;  Service: Vascular;  Laterality: Left;   FISTULOGRAM Left 12/21/2017   Procedure: FISTULOGRAM with Balloon Angioplasty.;  Surgeon: Sherren Kerns,  MD;  Location: [facility] OR;  Service: Vascular;  Laterality: Left;   HEMATOMA EVACUATION Left 08/27/2020   Procedure: EVACUATION HEMATOMA LEFT ARM;  Surgeon: Sherren Kerns, MD;  Location: [facility] OR;  Service: Vascular;  Laterality: Left;   IR FLUORO GUIDE CV LINE LEFT  09/22/2022   IR FLUORO GUIDE CV LINE RIGHT  09/12/2022   IR FLUORO GUIDE CV LINE RIGHT  09/17/2022   IR REMOVAL TUN CV CATH W/O FL  09/09/2022   IR THORACENTESIS ASP PLEURAL SPACE W/IMG GUIDE  08/22/2017   1.2 L -right-sided   IR THORACENTESIS ASP PLEURAL SPACE W/IMG GUIDE  10/19/2017   IR US GUIDE VASC ACCESS LEFT  09/22/2022   IR US GUIDE VASC ACCESS RIGHT  09/12/2022   KIDNEY TRANSPLANT     REVISON OF ARTERIOVENOUS FISTULA Left 12/21/2017   Procedure: PLICATION and Ligation of F LEFT ARM ARTERIOVENOUS FISTULA;  Surgeon: Sherren Kerns, MD;  Location: [facility] OR;  Service: Vascular;  Laterality: Left;   REVISON OF ARTERIOVENOUS FISTULA Left 07/16/2020   Procedure: EXCISION OF LEFT ARM ARTERIOVENOUS FISTULA;  Surgeon: Sherren Kerns, MD;  Location: [facility] OR;  Service: Vascular;  Laterality: Left;   stent in kidneys     dec 2017   TRANSTHORACIC ECHOCARDIOGRAM  09/22/2017    Severe LVH.  Normal function -EF 55-60%.  GRII DD.  Moderate RV dilation with mildly reduced RV function.   Fixed right coronary cusp with very mild aortic stenosis.  The myocardium has a speckled appearance --> .   Recommend cardiac MRI to evaluate for amyloid   UPPER EXTREMITY VENOGRAPHY Bilateral 06/01/2020   Procedure: UPPER EXTREMITY VENOGRAPHY;  Surgeon: Chuck Hint, MD;  Location: [facility] INVASIVE CV LAB;  Service: Cardiovascular;  Laterality: Bilateral;     Social History:   reports that he has never smoked. He has never used smokeless tobacco. He reports that he does not drink alcohol and does not use drugs.   Family History:  His family history includes Alcohol abuse in his maternal grandfather; Cancer in his father; Heart disease in his maternal  grandmother; Heart disease (age of onset: 40) in his mother; Hypertension in his mother and sister; Kidney cancer in his father; Learning disabilities in his paternal grandmother; Mental illness in his paternal grandmother; Stroke in his mother and paternal grandfather. There is no history of Colon cancer, Colon polyps, Esophageal cancer, Rectal cancer, or Stomach cancer.   Allergies Allergies  Allergen Reactions   Iodinated Contrast Media Itching and Nausea Only    Probably needs prednisone prep prior to contrast   Z-Pak [Azithromycin] Nausea And Vomiting and Other (See Comments)    Chest tightness GI Intolerance     Home Medications  Prior to Admission medications   Medication Sig Start Date End Date Taking? Authorizing Provider  atenolol (TENORMIN) 25 MG tablet Take 50 mg by mouth daily.   Yes [provider]  atorvastatin (LIPITOR) 40 MG tablet Take 40 mg by mouth daily.   Yes [provider]  cyclobenzaprine (FLEXERIL) 10 MG tablet Take 1 tablet (10 mg total) by mouth 2 (two) times daily as needed for muscle spasms. 01/15/22  Yes Carroll Sage, PA-C  EPINEPHrine 0.3 mg/0.3 mL IJ SOAJ injection Inject 0.3 mg into the muscle as needed for anaphylaxis. 06/08/21  Yes Redwine, Madison A, PA-C  loratadine (CLARITIN) 10 MG tablet Take 10 mg by mouth daily.   Yes [provider]  multivitamin (RENA-VIT) TABS tablet Take 1 tablet by mouth daily.   Yes [provider]  sevelamer carbonate (RENVELA) 800 MG tablet Take 1 tablet (800 mg total) by mouth 3 (three) times daily with meals. Patient taking differently: Take 800-1,600 mg by mouth See admin instructions. Take 2 tablets by mouth with each meal and 1 tablet with snacks 09/24/17  Yes Sheikh, Omair Latif, DO  tetrahydrozoline-zinc (VISINE-AC) 0.05-0.25 % ophthalmic solution Place 2 drops into both eyes 3 (three) times daily as needed (dry eyes).   Yes [provider]  triamcinolone cream (KENALOG)  0.1 % Apply 1 application topically daily as needed (dry spots on face/excema).   Yes [provider]  ceFAZolin (ANCEF) IVPB Inject 2 g into the vein Every Tuesday,Thursday,and Saturday with dialysis. Indication:  MSSA discitis/osteo First Dose: Yes Last Day of Therapy:  11/04/22 Labs - Once weekly:  CBC/D and BMP, ESR and CRP Method of administration: Per HD protocol Method of administration may be changed at the discretion of home infusion pharmacist based upon assessment of the patient and/or caregiver's ability to self-administer the medication ordered. 09/16/22 11/04/22  Danelle Earthly, MD  Cetirizine HCl 10 MG CAPS Take 1 capsule (10 mg total) by mouth 1 day or 1 dose. Patient not taking: Reported on 09/08/2022 06/08/21 09/07/22  Redwine, Madison A, PA-C  Cholecalciferol (VITAMIN D3) 125 MCG (5000 UT) CAPS Take 5,000 Units by mouth daily. Patient not taking: Reported on 09/08/2022    [provider]  cinacalcet (SENSIPAR) 30 MG tablet Take 30 mg by mouth at bedtime. Patient not taking: Reported on 09/08/2022 12/08/17   [provider]  Ferrous Sulfate (IRON) 325 (65 Fe) MG TABS Take 325 mg by mouth at bedtime. Patient not taking: Reported on 09/08/2022    [provider]  OVER THE COUNTER MEDICATION Take 1 tablet by mouth daily. Zinc and Copper Patient not taking: Reported on 09/08/2022    [provider]     Critical care time:      The  patient is critically ill due to acute respiratory failure with hypoxia/pulmonary edema/A-fib with RVR.  Critical care was necessary to treat or prevent imminent or life-threatening deterioration.  Critical care was time spent personally by me on the following activities: development of treatment plan with patient and/or surrogate as well as nursing, discussions with consultants, evaluation of patient's response to treatment, examination of patient, obtaining history from patient or surrogate, ordering and performing  treatments and interventions, ordering and review of laboratory studies, ordering and review of radiographic studies, pulse oximetry, re-evaluation of patient's condition and participation in multidisciplinary rounds.   During this encounter critical care time was devoted to patient care services described in this note for 41 minutes.     Cheri Fowler, MD [facility] See Amion for pager If no response to pager, please call [phone] until 7pm After 7pm, Please call E-link [phone]

## 2022-10-12 NOTE — Progress Notes (Signed)
Transported from 6E to 2H06 without complications

## 2022-10-12 NOTE — Progress Notes (Addendum)
[facility] Progress Note   Subjective:   Patient reports he had a difficult night last night, was unable to lay flat, had an episode of palpitations and dizziness again and is unhappy with nursing care last night. He did not sleep well. He is still feeling SOB today but improved. No current CP, palpitations, dizziness.   Objective Vitals:   10/11/22 2013 10/12/22 0048 10/12/22 0504 10/12/22 0845  BP: (!) 149/98 101/67 (!) 159/111 (!) 141/110  Pulse: (!) 102 94 99 98  Resp: 20 (!) 25 17 18   Temp: 98.6 F (37 C) 97.6 F (36.4 C) 98 F (36.7 C) 98.1 F (36.7 C)  TempSrc: Oral Oral Oral Oral  SpO2: 97% 91% 100% 93%  Weight:   81 kg   Height:       Physical Exam General: Alert male in NAD Heart: RRR, no murmurs, rubs or gallops Lungs: CTA bilaterally, on O2 via [place] Abdomen: Soft, non-distended, +BS Extremities: No edema b/l lower extremities Dialysis Access:  [facility]  Additional Objective Labs: Basic Metabolic Panel: Recent Labs  Lab 10/10/22 1300 10/10/22 1829 10/11/22 0246 10/12/22 0251  NA 126* 130* 129* 127*  K 3.8 3.6 3.9 3.3*  CL 90* 94* 90* 93*  CO2 23 23 21* 23  GLUCOSE 114* 98 107* 124*  BUN 32* 16 21 22   CREATININE 6.50* 3.90* 4.59* 4.06*  CALCIUM 8.3* 8.4* 8.5* 8.5*  PHOS 4.4 2.9  --  2.4*   Liver Function Tests: Recent Labs  Lab 10/09/22 1805 10/10/22 1300 10/10/22 1829  AST  --  41  --   ALT  --  7  --   ALKPHOS  --  107  --   BILITOT  --  0.7  --   PROT  --  7.6  --   ALBUMIN 2.0* 2.0* 2.0*   No results for input(s): "LIPASE", "AMYLASE" in the last 168 hours. CBC: Recent Labs  Lab 10/05/22 2023 10/06/22 0637 10/07/22 0548 10/08/22 0605 10/09/22 1138 10/09/22 1802 10/10/22 1300 10/11/22 0711 10/12/22 0251  WBC 14.7* 13.8* 12.9*   < > 14.3* 13.9* 14.0* 15.1* 16.3*  NEUTROABS 12.3* 11.1* 10.3*  --   --   --   --   --   --   HGB 7.0* 6.3* 6.1*   < > 7.7* 8.3* 7.9* 8.3* 8.8*  HCT 22.8* 21.3* 20.0*   < > 24.8* 26.4* 25.8*  27.6* 29.0*  MCV 83.2 83.5 82.6   < > 81.8 82.8 83.0 84.1 84.3  PLT 344 321 358   < > 377 407* 363 369 408*   < > = values in this interval not displayed.   Blood Culture    Component Value Date/Time   SDES BLOOD BLOOD RIGHT ARM 10/05/2022 2032   SPECREQUEST  10/05/2022 2032    BOTTLES DRAWN AEROBIC AND ANAEROBIC Blood Culture adequate volume   CULT  10/05/2022 2032    NO GROWTH 5 DAYS Performed at [facility], [address], [place], [place] [address]    REPTSTATUS 10/10/2022 FINAL 10/05/2022 2032    Cardiac Enzymes: No results for input(s): "CKTOTAL", "CKMB", "CKMBINDEX", "TROPONINI" in the last 168 hours. CBG: No results for input(s): "GLUCAP" in the last 168 hours. Iron Studies: No results for input(s): "IRON", "TIBC", "TRANSFERRIN", "FERRITIN" in the last 72 hours. @lablastinr3 @ Studies/Results: DG CHEST PORT 1 VIEW  Result Date: 10/12/2022 CLINICAL DATA:  10026 Shortness of breath 10026 EXAM: PORTABLE CHEST - 1 VIEW COMPARISON:  10/09/2022 FINDINGS:  Stable left IJ hemodialysis catheter. Slight improvement in the alveolar opacities seen previously, with residual airspace opacities in the right mid lung and left lung base. Heart size and mediastinal contours are within normal limits. Visualized bones unremarkable. IMPRESSION: Improving pulmonary edema. Electronically Signed   By: Corlis Leak M.D.   On: 10/12/2022 09:21   Medications:  ceFAZolin Stopped (10/11/22 2047)   diltiazem (CARDIZEM) infusion 15 mg/hr (10/12/22 0645)   magnesium sulfate bolus IVPB 2 g (10/12/22 1003)    calcitRIOL  0.5 mcg Oral Q T,Th,Sat-1800   Chlorhexidine Gluconate Cloth  6 each Topical Q0600   Darbepoetin Alfa  150 mcg Subcutaneous Q Thu-1800   feeding supplement (NEPRO CARB STEADY)  237 mL Oral BID BM   heparin  5,000 Units Subcutaneous Q8H   methocarbamol  250 mg Oral BID   midodrine  10 mg Oral TID WC   pantoprazole  40 mg Oral Daily    Outpatient Dialysis Orders: [facility]  3:45  450/A1.5x 2K/2Ca EDW 85.7kg TDC  -Heparin 3000 units IV TIW  -No ESA -Calcitriol 1.5 MCG PO three times per week  Assessment/Plan: 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 ID RX  Blood Cx 6/16 MSSA, negative Cx on 6/17, 6/19. TDC removed 6/18. Completed 72 hour line holiday. TTE concerning for endocarditis. TEE cancelled d/t high risk factors. LS MRI with possible L5-S1 discitis. Temp cath placed 6/21, but poor function requiring exchange -  Will be getting IV Cefazolin 2g q HD x 6 weeks (until 11/04/22). 2. Deconditioning [name]: in CIR, was transferred back to 6E due to  medical complications 3. Anemia - Hgb 8.8. +FOBT, GI -recommended PPI. Hold heparin with HD. Per primary service. Aranesp q Thursday, increased next dose to .   4. Hyponatremia: managing with HD and UF 5. ESRD: Usual TTS schedule - then line issues preventing HD 6/27 -7/1. HD terminated early Thursday, tolerated well Friday and Saturday. RIJ TDC removed 6/18 - s/p line holiday, then temp line placed 6/21 with recurrent non-function issues despite exchange 6/26. S/p [facility] 7/1 in IR. Extra HD today for volume, see below.   6. Perm Access: S/p L AVF ligation in 2019. Last seen by Dr. Edilia Bo 01/2022 - limited mobility in R arm so planning for L arm graft but had not scheduled surgery yet. Can revisit as an outpatient once bacteremia resolved.  7. Hypotension/volume: on midodrine. UF as tolerated.  SOB likely volume mediated based on CXR. Still with some orthopnea and on O2. UF has been limited by tachycardia. Repeated CXR today due to worsening symptoms last night, showed improving edema. Will plan for an extra HD today to see if this helps with his SOB.   8. 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 Not on phos binder currently.    9. Nutrition - Renal diet, Alb low - continue supplements    Rogers Blocker, PA-C 10/12/2022, 10:16 AM  [facility] Pager: [phone]

## 2022-10-13 DIAGNOSIS — N250 Renal osteodystrophy: Secondary | ICD-10-CM | POA: Diagnosis not present

## 2022-10-13 DIAGNOSIS — Z992 Dependence on renal dialysis: Secondary | ICD-10-CM | POA: Diagnosis not present

## 2022-10-13 DIAGNOSIS — R7881 Bacteremia: Secondary | ICD-10-CM | POA: Diagnosis not present

## 2022-10-13 DIAGNOSIS — N186 End stage renal disease: Secondary | ICD-10-CM | POA: Diagnosis not present

## 2022-10-13 DIAGNOSIS — D631 Anemia in chronic kidney disease: Secondary | ICD-10-CM | POA: Diagnosis not present

## 2022-10-13 DIAGNOSIS — B9561 Methicillin susceptible Staphylococcus aureus infection as the cause of diseases classified elsewhere: Secondary | ICD-10-CM | POA: Diagnosis not present

## 2022-10-13 DIAGNOSIS — I120 Hypertensive chronic kidney disease with stage 5 chronic kidney disease or end stage renal disease: Secondary | ICD-10-CM | POA: Diagnosis not present

## 2022-10-13 LAB — RENAL FUNCTION PANEL
Albumin: 2.3 g/dL — ABNORMAL LOW (ref 3.5–5.0)
Albumin: 2.3 g/dL — ABNORMAL LOW (ref 3.5–5.0)
Anion gap: 16 — ABNORMAL HIGH (ref 5–15)
Anion gap: 8 (ref 5–15)
BUN: 19 mg/dL (ref 8–23)
BUN: 13 mg/dL (ref 8–23)
CO2: 23 mmol/L (ref 22–32)
CO2: 24 mmol/L (ref 22–32)
Calcium: 8.5 mg/dL — ABNORMAL LOW (ref 8.9–10.3)
Calcium: 8.3 mg/dL — ABNORMAL LOW (ref 8.9–10.3)
Chloride: 94 mmol/L — ABNORMAL LOW (ref 98–111)
Chloride: 100 mmol/L (ref 98–111)
Creatinine, Ser: 3.6 mg/dL — ABNORMAL HIGH (ref 0.61–1.24)
Creatinine, Ser: 2.76 mg/dL — ABNORMAL HIGH (ref 0.61–1.24)
GFR, Estimated: 18 mL/min — ABNORMAL LOW (ref >60)
GFR, Estimated: 25 mL/min — ABNORMAL LOW (ref >60)
Glucose, Bld: 108 mg/dL — ABNORMAL HIGH (ref 70–99)
Glucose, Bld: 111 mg/dL — ABNORMAL HIGH (ref 70–99)
Phosphorus: 2.7 mg/dL (ref 2.5–4.6)
Phosphorus: 2.2 mg/dL — ABNORMAL LOW (ref 2.5–4.6)
Potassium: 4.1 mmol/L (ref 3.5–5.1)
Potassium: 4.1 mmol/L (ref 3.5–5.1)
Sodium: 133 mmol/L — ABNORMAL LOW (ref 135–145)
Sodium: 132 mmol/L — ABNORMAL LOW (ref 135–145)

## 2022-10-13 LAB — CBC
HCT: 27.5 % — ABNORMAL LOW (ref 39.0–52.0)
Hemoglobin: 8.2 g/dL — ABNORMAL LOW (ref 13.0–17.0)
MCH: 25.5 pg — ABNORMAL LOW (ref 26.0–34.0)
MCHC: 29.8 g/dL — ABNORMAL LOW (ref 30.0–36.0)
MCV: 85.4 fL (ref 80.0–100.0)
Platelets: 289 10*3/uL (ref 150–400)
RBC: 3.22 MIL/uL — ABNORMAL LOW (ref 4.22–5.81)
RDW: 18.9 % — ABNORMAL HIGH (ref 11.5–15.5)
WBC: 14.6 10*3/uL — ABNORMAL HIGH (ref 4.0–10.5)
nRBC: 2.1 % — ABNORMAL HIGH (ref 0.0–0.2)

## 2022-10-13 LAB — APTT: aPTT: 44 seconds — ABNORMAL HIGH (ref 24–36)

## 2022-10-13 LAB — MAGNESIUM: Magnesium: 2.2 mg/dL (ref 1.7–2.4)

## 2022-10-13 MED ORDER — RENA-VITE PO TABS
1.0000 | ORAL_TABLET | Freq: Every day | ORAL | Status: DC
  Administered 2022-10-13 – 2022-11-06 (×25): 1 via ORAL
  Filled 2022-10-13 (×25): qty 1

## 2022-10-13 MED ORDER — METOPROLOL TARTRATE 50 MG PO TABS
50.0000 mg | ORAL_TABLET | Freq: Three times a day (TID) | ORAL | Status: DC
  Administered 2022-10-13 – 2022-10-17 (×7): 50 mg via ORAL
  Filled 2022-10-13 (×7): qty 1

## 2022-10-13 MED ORDER — PROSOURCE PLUS PO LIQD
30.0000 mL | Freq: Three times a day (TID) | ORAL | Status: DC
  Administered 2022-10-13 – 2022-11-07 (×64): 30 mL via ORAL
  Filled 2022-10-13 (×64): qty 30

## 2022-10-13 MED ORDER — NEPRO/CARBSTEADY PO LIQD
237.0000 mL | Freq: Three times a day (TID) | ORAL | Status: DC
  Administered 2022-10-14 – 2022-11-07 (×55): 237 mL via ORAL

## 2022-10-13 MED ORDER — BIOTENE DRY MOUTH MT LIQD: 15.0000 mL | OROMUCOSAL | Status: DC | PRN

## 2022-10-13 NOTE — Care Plan (Signed)
Patient remains under ICU team, we will sign off.

## 2022-10-13 NOTE — Consult Note (Deleted)
NAME:  Ryan [name], MRN:  [id number], DOB:  Oct 13, 1960, LOS: 4 ADMISSION DATE:  10/09/2022, CONSULTATION DATE:  10/09/2022 REFERRING MD: Rhona Leavens - TR[facility] CHIEF COMPLAINT:  SOB, AF with RVR  History of Present Illness:  62 year old man who presented to [facility] 7/18 from CIR for new-onset Afib with RVR. PMHx significant for HTN, HLD and ESRD on dialysis.  Initially presented to [facility] ED 6/16 with complaints of low back pain. Workup was significant for L5-S1 discitis/osteomyelitis with MSSA bacteremia.  ID was consulted and has been managing antibiotics and infectious workup; patient has been treated with cefazolin IV. Concern on TTE for a large, rounded structure on the anterior leaflet of the mitral valve.  Chest and brain imaging unfortunately consistent with septic embolization to both areas.  The appropriate specialties were involved and all opted to continue with conservative treatment of antibiotics for now (Ancef course to end 8/13).  Patient had been getting dialyzed through tunneled right IJ catheter which was removed for line holiday; this was replaced by temporary femoral access by IR which unfortunately became unable to be used, complicating dialysis session 6/29 which was unable to be completed.  Since that time the patient has become progressively SOB intermittently requiring BiPAP, felt to be due to volume overload/missed dialysis, however, CXR did demonstrate changes consistent with cavitary lesions. Discharged to CIR 7/4.  Patient was supposed to get hemodialysis 7/18 but went into A-fib with RVR with associated SOB, tachypnea, hypoxia and hemodialysis was held. PCCM was consulted for assistance with medical management.  Pertinent Medical History:   Past Medical History:  Diagnosis Date   Allergy    Anemia    Blood transfusion without reported diagnosis    Dialysis patient (HCC)    Tues,thurs,sat   ESRD (end stage renal disease) (HCC)    TTHS-    Family history of adverse reaction  to anesthesia    father had allergic reaction with anesthesia with a dental procedure-(pt. doesn't know)   Hyperlipidemia    Hypertension    Significant Hospital Events: Including procedures, antibiotic start and stop dates in addition to other pertinent events   6/16 - 7/4 - [facility] admission for L5-S1 osteomyelitis, MSSA bacteremia 7/4 - Admitted to CIR 7/18 - Readmitted to [facility] for Afib RVR/SOB requiring BiPAP, inability to complete HD. 7/21 - Off of BiPAP, tolerating Salter. Remains anxious. Tolerating CRRT.  Interim History / Subjective:  Required Precedex overnight for anxiousness Tolerated BiPAP, now off this AM and on Salter 15L Remains anxious at present, asking about breathing exercises he can do Tolerating CRRT for now Volume status is slowly improving Remains intermittently in Afib  Objective:  Blood pressure 125/67, pulse (!) 118, temperature 97.7 F (36.5 C), temperature source Axillary, resp. rate (!) 21, height 5\' 9"  (1.753 m), weight 80.1 kg, SpO2 97%.    FiO2 (%):  [50 %-60 %] 50 %   Intake/Output Summary (Last 24 hours) at 10/13/2022 3244 Last data filed at 10/13/2022 0800 Gross per 24 hour  Intake 1178.86 ml  Output 1307.8 ml  Net -128.94 ml   Filed Weights   10/12/22 0504 10/12/22 1500 10/13/22 0645  Weight: 81 kg 81.8 kg 80.1 kg   Physical Examination: General: Acutely ill-appearing middle-aged man in NAD. HEENT: [place]/AT, anicteric sclera, PERRL, moist mucous membranes. Neuro: Awake, oriented x 4. Occasionally dozes off on low-dose Precedex. Responds to verbal stimuli. Following commands consistently. Moves all 4 extremities spontaneously. Generalized weakness.  CV: RRR, no m/g/r. PULM: Breathing even and  unlabored on 15L Salter. Lung fields diminished at bases bilaterally, faint crackles throughout. GI: Soft, nontender, nondistended. Normoactive bowel sounds. Extremities: Bilateral symmetric 1+ pitting LE edema noted. Skin: Warm/dry, no rashes.  Resolved  Hospital Problem List:     Assessment & Plan:  Acute respiratory failure with hypoxia Pulmonary edema Acute-on-chronic HFpEF - Supplemental O2 support, currently tolerating Salter HFNC - Wean O2 for sat > 90% - BiPAP PRN - Daily WUA/SBT - VAP bundle - Pulmonary hygiene - PAD protocol for sedation: Precedex for goal RASS 0 to -1  End-stage renal disease on hemodialysis - Nephrology consulted, appreciate recs - CRRT per Nephro, transition to iHD as tolerated once hemodynamics improved - Trend BMP - Replete electrolytes as indicated - Monitor I&Os - Avoid nephrotoxic agents as able - Ensure adequate renal perfusion  Paroxysmal A-fib with RVR CHA2DS2-VASc Score 2. 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 - S/p Zosyn course - Continue IV Ancef (end date 8/13)  Best Practice (right click and "Reselect all SmartList Selections" daily)   Diet/type: Regular consistency (see orders) DVT prophylaxis: prophylactic heparin  GI prophylaxis: PPI Central venous access:  Yes, and it is still needed - HD catheter Foley:  N/A Code Status:  full code Last date of multidisciplinary goals of care discussion [Pending]  Critical care time:    The patient is critically ill with multiple organ system failure and requires high complexity decision making for assessment and support, frequent evaluation and titration of therapies, advanced monitoring, review of radiographic studies and  interpretation of complex data.   Critical Care Time devoted to patient care services, exclusive of separately billable procedures, described in this note is 36 minutes.  Tim Lair, PA-C [facility] 10/13/22 8:43 AM  Please see [email or web address] for pager details.  From 7A-7P if no response, please call [phone] After hours, please call ELink [phone]

## 2022-10-13 NOTE — Progress Notes (Signed)
[facility] Progress Note   Subjective:   Patient states his breathing is "much better" today. No other c/o  Objective Vitals:   10/13/22 1400 10/13/22 1500 10/13/22 1600 10/13/22 1608  BP: 114/60 120/70 97/65   Pulse:      Resp: (!) 26 (!) 29 (!) 23 (!) 29  Temp:    97.7 F (36.5 C)  TempSrc:    Oral  SpO2: 100% 100% 100% 100%  Weight:      Height:       Physical Exam General: Alert male in NAD, +JVD, mild ^wob Heart: RRR, no RG Lungs: CTA bilaterally, on O2 via [place] Abdomen: Soft, non-distended, +BS Extremities: No edema b/l lower extremities Dialysis Access:  [facility]   Outpatient Dialysis Orders: [facility] TTS  3:45 450/A1.5x 2K/2Ca EDW 85.7kg TDC  -Heparin 3000 units IV TIW  -No ESA -Calcitriol 1.5 MCG PO three times per week  Assessment/Plan: 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 ID RX  Blood Cx 6/16 MSSA, negative Cx on 6/17, 6/19. TDC removed 6/18. Completed 72 hour line holiday. TTE was concerning for endocarditis. TEE cancelled d/t high risk factors. LS MRI with possible L5-S1 discitis. Will be getting IV Cefazolin 2g q HD x 6 weeks (until 11/04/22). Deconditioning /weakness: was in CIR, was transferred back to 6E due to  medical complications ESRD: Usual TTS schedule - S/p LIJ TDC 7/1 in IR. Extra HD done yesterday for vol overload, pt didn't tolerate and was moved to ICU and started on CRRT.  Cont CRRT, still has extra volume on board.  Perm Access: S/p L AVF ligation in 2019. Last seen by Dr. Edilia Bo 01/2022 - limited mobility in R arm so planning for L arm graft but had not scheduled surgery yet. Can revisit as an outpatient once bacteremia resolved.  Anemia - Hgb 8.8. +FOBT, GI -recommended PPI. Hold heparin with HD. Per primary service. Aranesp q Thursday, increased next dose to .   Hyponatremia: managing with HD and UF. Improving. Hypotension/volume: on midodrine.  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 Not on phos  binder currently.    Nutrition - Renal diet, Alb low - continue supplements    Vinson Moselle  MD  [facility] 10/13/2022, 4:51 PM  Recent Labs  Lab 10/12/22 1330 10/12/22 1557 10/13/22 0108  HGB 9.1*  --  8.2*  ALBUMIN 2.1* 2.4* 2.3*  CALCIUM 8.9 8.7* 8.5*  PHOS 2.5 3.2 2.7  CREATININE 4.83* 4.72* 3.60*  K 3.4* 3.4* 4.1    Inpatient medications:  (feeding supplement) PROSource Plus  30 mL Oral TID WC   calcitRIOL  0.5 mcg Oral Q T,Th,Sat-1800   Chlorhexidine Gluconate Cloth  6 each Topical Q0600   Darbepoetin Alfa  150 mcg Subcutaneous Q Thu-1800   feeding supplement (NEPRO CARB STEADY)  237 mL Oral TID BM   heparin  5,000 Units Subcutaneous Q8H   methocarbamol  250 mg Oral BID   metoprolol tartrate  50 mg Oral Q8H   multivitamin  1 tablet Oral QHS   mouth rinse  15 mL Mouth Rinse 4 times per day   pantoprazole  40 mg Oral Daily     prismasol BGK 4/2.5 400 mL/hr at 10/13/22 0501    prismasol BGK 4/2.5 400 mL/hr at 10/13/22 1402   sodium chloride 5 mL/hr at 10/13/22 1600   ceFAZolin Stopped (10/13/22 0617)   dexmedetomidine (PRECEDEX) IV infusion 0.2 mcg/kg/hr (10/13/22 1600)   diltiazem (CARDIZEM) infusion Stopped (10/12/22 2201)   heparin 10,000  units/ 20 mL infusion syringe 750 Units/hr (10/13/22 0935)   prismasol BGK 4/2.5 1,500 mL/hr at 10/13/22 0605   sodium chloride, acetaminophen **OR** acetaminophen, antiseptic oral rinse, bisacodyl, guaiFENesin, guaiFENesin-codeine, heparin, hydrALAZINE, ipratropium-albuterol, ondansetron **OR** ondansetron (ZOFRAN) IV, mouth rinse, oxyCODONE, senna-docusate, traZODone

## 2022-10-13 NOTE — Progress Notes (Signed)
   10/13/22 0254  BiPAP/CPAP/SIPAP  $ Non-Invasive Ventilator  Non-Invasive Vent Subsequent  BiPAP/CPAP/SIPAP Pt Type Adult  BiPAP/CPAP/SIPAP V60  Mask Type Full face mask  Mask Size Medium  Set Rate 10 breaths/min  Respiratory Rate 33 breaths/min  IPAP 14 cmH20  EPAP 5 cmH2O  FiO2 (%) 50 %  Flow Rate 0.8 lpm  Minute Ventilation 13.3  Leak 35  Peak Inspiratory Pressure (PIP) 14  Tidal Volume (Vt) 851  Patient Home Equipment No  Auto Titrate No  Press High Alarm 25 cmH2O  Press Low Alarm 5 cmH2O

## 2022-10-13 NOTE — Progress Notes (Signed)
NAME:  Ryan [name], MRN:  [id number], DOB:  Aug 10, 1960, LOS: 4 ADMISSION DATE:  10/09/2022, CONSULTATION DATE:  10/09/2022 REFERRING MD: Rhona Leavens - TRH CHIEF COMPLAINT:  SOB, AF with RVR  History of Present Illness:  62 year old man who presented to [facility] 7/18 from CIR for new-onset Afib with RVR. PMHx significant for HTN, HLD and ESRD on dialysis.  Initially presented to [facility] ED 6/16 with complaints of low back pain. Workup was significant for L5-S1 discitis/osteomyelitis with MSSA bacteremia.  ID was consulted and has been managing antibiotics and infectious workup; patient has been treated with cefazolin IV. Concern on TTE for a large, rounded structure on the anterior leaflet of the mitral valve.  Chest and brain imaging unfortunately consistent with septic embolization to both areas.  The appropriate specialties were involved and all opted to continue with conservative treatment of antibiotics for now (Ancef course to end 8/13).  Patient had been getting dialyzed through tunneled right IJ catheter which was removed for line holiday; this was replaced by temporary femoral access by IR which unfortunately became unable to be used, complicating dialysis session 6/29 which was unable to be completed.  Since that time the patient has become progressively SOB intermittently requiring BiPAP, felt to be due to volume overload/missed dialysis, however, CXR did demonstrate changes consistent with cavitary lesions. Discharged to CIR 7/4.  Patient was supposed to get hemodialysis 7/18 but went into A-fib with RVR with associated SOB, tachypnea, hypoxia and hemodialysis was held. PCCM was consulted for assistance with medical management.  Pertinent Medical History:   Past Medical History:  Diagnosis Date   Allergy    Anemia    Blood transfusion without reported diagnosis    Dialysis patient (HCC)    Tues,thurs,sat   ESRD (end stage renal disease) (HCC)    TTHS-    Family history of adverse reaction  to anesthesia    father had allergic reaction with anesthesia with a dental procedure-(pt. doesn't know)   Hyperlipidemia    Hypertension    Significant Hospital Events: Including procedures, antibiotic start and stop dates in addition to other pertinent events   6/16 - 7/4 - [facility] admission for L5-S1 osteomyelitis, MSSA bacteremia 7/4 - Admitted to CIR 7/18 - Readmitted to [facility] for Afib RVR/SOB requiring BiPAP, inability to complete HD. 7/21 - Off of BiPAP, tolerating Salter. Remains anxious. Tolerating CRRT.  Interim History / Subjective:  Required Precedex overnight for anxiousness Tolerated BiPAP, now off this AM and on Salter 15L Remains anxious at present, asking about breathing exercises he can do Tolerating CRRT for now Volume status is slowly improving Remains intermittently in Afib  Objective:  Blood pressure 138/79, pulse (!) 118, temperature 98 F (36.7 C), temperature source Oral, resp. rate (!) 30, height 5\' 9"  (1.753 m), weight 80.1 kg, SpO2 99%.    FiO2 (%):  [50 %-60 %] 50 %   Intake/Output Summary (Last 24 hours) at 10/13/2022 1237 Last data filed at 10/13/2022 1200 Gross per 24 hour  Intake 609.92 ml  Output 1663.8 ml  Net -1053.88 ml   Filed Weights   10/12/22 0504 10/12/22 1500 10/13/22 0645  Weight: 81 kg 81.8 kg 80.1 kg   Physical Examination: General: Acutely ill-appearing middle-aged man in NAD. HEENT: [place]/AT, anicteric sclera, PERRL, moist mucous membranes. Neuro: Awake, oriented x 4. Occasionally dozes off on low-dose Precedex. Responds to verbal stimuli. Following commands consistently. Moves all 4 extremities spontaneously. Generalized weakness.  CV: RRR, no m/g/r. PULM: Breathing even and  unlabored on 15L [place]. Lung fields diminished at bases bilaterally, faint crackles throughout. GI: Soft, nontender, nondistended. Normoactive bowel sounds. Extremities: Bilateral symmetric 1+ pitting LE edema noted. Skin: Warm/dry, no rashes.  Resolved  Hospital Problem List:     Assessment & Plan:  Acute respiratory failure with hypoxia Pulmonary edema Acute-on-chronic HFpEF - Supplemental O2 support, currently tolerating Salter HFNC - Wean O2 for sat > 90% - BiPAP PRN - Daily WUA/SBT - VAP bundle - Pulmonary hygiene - PAD protocol for sedation: Precedex for goal RASS 0 to -1  End-stage renal disease on hemodialysis - Nephrology consulted, appreciate recs - CRRT per Nephro, transition to iHD as tolerated once hemodynamics improved - Trend BMP - Replete electrolytes as indicated - Monitor I&Os - Avoid nephrotoxic agents as able - Ensure adequate renal perfusion  Paroxysmal A-fib with RVR CHA2DS2-VASc Score 2. 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 - S/p Zosyn course - Continue IV Ancef (end date 8/13)  Best Practice (right click and "Reselect all SmartList Selections" daily)   Diet/type: Regular consistency (see orders) DVT prophylaxis: prophylactic heparin  GI prophylaxis: PPI Central venous access:  Yes, and it is still needed - HD catheter Foley:  N/A Code Status:  full code Last date of multidisciplinary goals of care discussion [Pending]  Critical care time:    The patient is critically ill with multiple organ system failure and requires high complexity decision making for assessment and support, frequent evaluation and titration of therapies, advanced monitoring, review of radiographic studies and  interpretation of complex data.   Critical Care Time devoted to patient care services, exclusive of separately billable procedures, described in this note is 36 minutes.  Tim Lair, PA-C [facility] 10/13/22 12:37 PM  Please see [email or web address] for pager details.  From 7A-7P if no response, please call [phone] After hours, please call ELink [phone]

## 2022-10-13 NOTE — Plan of Care (Signed)
  Problem: Clinical Measurements: Goal: Ability to maintain clinical measurements within normal limits will improve Outcome: Progressing Goal: Will remain free from infection Outcome: Progressing Goal: Diagnostic test results will improve Outcome: Progressing Goal: Respiratory complications will improve Outcome: Progressing Goal: Cardiovascular complication will be avoided Outcome: Progressing   Problem: Activity: Goal: Risk for activity intolerance will decrease Outcome: Progressing   Problem: Coping: Goal: Level of anxiety will decrease Outcome: Progressing   Problem: Elimination: Goal: Will not experience complications related to urinary retention Outcome: Not Applicable   Problem: Pain Managment: Goal: General experience of comfort will improve Outcome: Progressing   Problem: Safety: Goal: Ability to remain free from injury will improve Outcome: Progressing   Problem: Skin Integrity: Goal: Risk for impaired skin integrity will decrease Outcome: Progressing

## 2022-10-13 NOTE — Progress Notes (Signed)
Initial Nutrition Assessment  DOCUMENTATION CODES:   Non-severe (moderate) malnutrition in context of chronic illness (worsening malnutrition with this acute illness)  INTERVENTION:   Recommend trial of Biotene or other mouthwash to see if this helps with dry mouth and swallowing in addition to the "film" on patient's tongue and throat.   Recommend Dysphagia 3 diet  Resume Renal MVI  Trial of 30 ml ProSource Plus TID, each supplement provides 100 kcals and 15 grams protein.   Continue Nepro Shake po TID, each supplement provides 425 kcal and 19 grams protein. Pt likes the Nepro shakes but with the film on his tongue/throat he does find the milky stuff to be harder to swallow at times. Pt inquired about other type of supplements, Ok for pt to trial [employer] (juice like) but given [employer] has fewer calories and less protein than Nepro, encouraged pt to continue some of the Nepro at least  Trial Magic cup TID with meals, each supplement provides 290 kcal and 9 grams of protein  Recommend considering reducing EDW at discharge given +true weight loss, current wt under EDW   NUTRITION DIAGNOSIS:   Moderate Malnutrition related to chronic illness (worsening with acute illness) as evidenced by moderate muscle depletion, moderate fat depletion, energy intake < 75% for > or equal to 1 month.  GOAL:   Patient will meet greater than or equal to 90% of their needs  MONITOR:   PO intake, Supplement acceptance, Weight trends, Labs  REASON FOR ASSESSMENT:   Consult Assessment of nutrition requirement/status (CRRT)  ASSESSMENT:   62 yo male admitted from CIR with afib with RVR and acute respiratory failure with pulmonary edema. Pt with recent admission to [facility] from 6/16 to 7/04 for L5-S1 osteomyelitis and MSSA bacteremia in addition to infective endocarditis of the MV and septic emboli to lungs and brain.  PMH includes ESRD on iHD (since 2018), HTN, HLD  6/16 Admitted to  [facility] for L5-S1 osteomyelitis, MSSA bacteremia 7/04 D/C from hospital with admission to CIR 7/18 Readmitted to [facility] from CIR with Afib RVR while receiving iHD and SOB requiring BiPap 7/21 Rapid response, acute respiratory distress, transferred to ICU, CRRT initiated  Currently on CRRT, currently on HFNC Pt currently on precedex for anxiety  Pt appetite is not great. Pt did not really eat much at breakfast, did drink some Nepro. Lunch tray at bedside on visit but untouched (only consisted of soup, grapes and jello). No recorded po intake. Pt reports prior to initial admission, pt only eats 2 meals per day at home (on iHD and non-iHD days); pt reports she also takes Nepro supplements.   Pt reports difficulty swallowing-noted SLP following previously and MBS performed and Reg/Dys 3 with Thins recommended; pt reports liquids or foods like pudding/ice cream seem to go down more smoothly than other foods. Pt reports when he tried to eat the scrambled   Pt reports a "gritty film" on his tongue that he reports he has to wipe off before he eats. He believes this film actually extends all the way into this throat. Pt denies any pain on tongue or in mouth/throat. RD examined tongue and does not appear like thrush. Pt also indicates chronic dry mouth (iHD patient) and mouth has been dry. Lack of saliva can impair swallowing. Discussed possibility of Biotene or other dry mouth mouthwash with patient and RN; pt is agreeable to trying to see if this helps.   Pt is below outpatient EDW of 85.5  kg; current wt 80.1 kg. Looks like at times since June, weight has been in the 70 kg range as well  Pt reports at baseline he is independent; however, he has been weakened by this acute illness. Pt believes he was progressing well with therapy at CIR  Labs: sodium 133 (L), potassium 4.1 (wdl), phosphorus 2.7 (wdl), magnesium 2.2 (wdl) Meds: calcitriol, aranesp, precedex   NUTRITION - FOCUSED PHYSICAL  EXAM:  Flowsheet Row Most Recent Value  Orbital Region Mild depletion  Upper Arm Region Mild depletion  Thoracic and Lumbar Region Mild depletion  Buccal Region Mild depletion  Temple Region Moderate depletion  Clavicle Bone Region Mild depletion  Clavicle and Acromion Bone Region Mild depletion  Scapular Bone Region Mild depletion  Dorsal Hand Moderate depletion  Patellar Region Moderate depletion  Anterior Thigh Region Moderate depletion  Posterior Calf Region Severe depletion  Edema (RD Assessment) Mild       Diet Order:   Diet Order             DIET DYS 3 Room service appropriate? Yes; Fluid consistency: Thin  Diet effective now                   EDUCATION NEEDS:   Education needs have been addressed  Skin:  Skin Assessment: Skin Integrity Issues: Skin Integrity Issues:: Other (Comment) Other: skin tears  Last BM:  7/20  Height:   Ht Readings from Last 1 Encounters:  10/09/22 5\' 9"  (1.753 m)    Weight:   Wt Readings from Last 1 Encounters:  10/13/22 80.1 kg     BMI:  Body mass index is 26.08 kg/m.  Estimated Nutritional Needs:   Kcal:  2200-2400 kcals  Protein:  120-140 g  Fluid:  1L plus UOP  Romelle Starcher MS, RDN, LDN, CNSC Registered Dietitian 3 Clinical Nutrition RD Pager and On-Call Pager Number Located in [place]

## 2022-10-14 ENCOUNTER — Inpatient Hospital Stay (HOSPITAL_COMMUNITY): Payer: No Typology Code available for payment source

## 2022-10-14 DIAGNOSIS — D631 Anemia in chronic kidney disease: Secondary | ICD-10-CM | POA: Diagnosis not present

## 2022-10-14 DIAGNOSIS — R918 Other nonspecific abnormal finding of lung field: Secondary | ICD-10-CM | POA: Diagnosis not present

## 2022-10-14 DIAGNOSIS — I120 Hypertensive chronic kidney disease with stage 5 chronic kidney disease or end stage renal disease: Secondary | ICD-10-CM | POA: Diagnosis not present

## 2022-10-14 DIAGNOSIS — Z992 Dependence on renal dialysis: Secondary | ICD-10-CM | POA: Diagnosis not present

## 2022-10-14 DIAGNOSIS — B9561 Methicillin susceptible Staphylococcus aureus infection as the cause of diseases classified elsewhere: Secondary | ICD-10-CM | POA: Diagnosis not present

## 2022-10-14 DIAGNOSIS — R7881 Bacteremia: Secondary | ICD-10-CM | POA: Diagnosis not present

## 2022-10-14 DIAGNOSIS — I517 Cardiomegaly: Secondary | ICD-10-CM | POA: Diagnosis not present

## 2022-10-14 DIAGNOSIS — N186 End stage renal disease: Secondary | ICD-10-CM | POA: Diagnosis not present

## 2022-10-14 DIAGNOSIS — N250 Renal osteodystrophy: Secondary | ICD-10-CM | POA: Diagnosis not present

## 2022-10-14 LAB — RENAL FUNCTION PANEL
Albumin: 2.4 g/dL — ABNORMAL LOW (ref 3.5–5.0)
Albumin: 2.5 g/dL — ABNORMAL LOW (ref 3.5–5.0)
Anion gap: 12 (ref 5–15)
Anion gap: 13 (ref 5–15)
BUN: 13 mg/dL (ref 8–23)
BUN: 11 mg/dL (ref 8–23)
CO2: 23 mmol/L (ref 22–32)
CO2: 23 mmol/L (ref 22–32)
Calcium: 8.5 mg/dL — ABNORMAL LOW (ref 8.9–10.3)
Calcium: 8.7 mg/dL — ABNORMAL LOW (ref 8.9–10.3)
Chloride: 98 mmol/L (ref 98–111)
Chloride: 96 mmol/L — ABNORMAL LOW (ref 98–111)
Creatinine, Ser: 2.29 mg/dL — ABNORMAL HIGH (ref 0.61–1.24)
Creatinine, Ser: 2.18 mg/dL — ABNORMAL HIGH (ref 0.61–1.24)
GFR, Estimated: 32 mL/min — ABNORMAL LOW (ref >60)
GFR, Estimated: 34 mL/min — ABNORMAL LOW (ref >60)
Glucose, Bld: 115 mg/dL — ABNORMAL HIGH (ref 70–99)
Glucose, Bld: 123 mg/dL — ABNORMAL HIGH (ref 70–99)
Phosphorus: 2.1 mg/dL — ABNORMAL LOW (ref 2.5–4.6)
Phosphorus: 1.6 mg/dL — ABNORMAL LOW (ref 2.5–4.6)
Potassium: 4.2 mmol/L (ref 3.5–5.1)
Potassium: 4 mmol/L (ref 3.5–5.1)
Sodium: 133 mmol/L — ABNORMAL LOW (ref 135–145)
Sodium: 132 mmol/L — ABNORMAL LOW (ref 135–145)

## 2022-10-14 LAB — CBC
HCT: 28.2 % — ABNORMAL LOW (ref 39.0–52.0)
Hemoglobin: 8.4 g/dL — ABNORMAL LOW (ref 13.0–17.0)
MCH: 25.5 pg — ABNORMAL LOW (ref 26.0–34.0)
MCHC: 29.8 g/dL — ABNORMAL LOW (ref 30.0–36.0)
MCV: 85.5 fL (ref 80.0–100.0)
Platelets: 286 10*3/uL (ref 150–400)
RBC: 3.3 MIL/uL — ABNORMAL LOW (ref 4.22–5.81)
RDW: 19.9 % — ABNORMAL HIGH (ref 11.5–15.5)
WBC: 12 10*3/uL — ABNORMAL HIGH (ref 4.0–10.5)
nRBC: 2.5 % — ABNORMAL HIGH (ref 0.0–0.2)

## 2022-10-14 LAB — GLUCOSE, CAPILLARY: Glucose-Capillary: 131 mg/dL — ABNORMAL HIGH (ref 70–99)

## 2022-10-14 LAB — MAGNESIUM: Magnesium: 2.4 mg/dL (ref 1.7–2.4)

## 2022-10-14 LAB — APTT: aPTT: 43 seconds — ABNORMAL HIGH (ref 24–36)

## 2022-10-14 MED ORDER — REVEFENACIN 175 MCG/3ML IN SOLN
175.0000 ug | Freq: Every day | RESPIRATORY_TRACT | Status: DC
  Administered 2022-10-14 – 2022-11-07 (×20): 175 ug via RESPIRATORY_TRACT
  Filled 2022-10-14 (×23): qty 3

## 2022-10-14 MED ORDER — ARFORMOTEROL TARTRATE 15 MCG/2ML IN NEBU
15.0000 ug | INHALATION_SOLUTION | Freq: Two times a day (BID) | RESPIRATORY_TRACT | Status: DC
  Administered 2022-10-14 – 2022-11-07 (×43): 15 ug via RESPIRATORY_TRACT
  Filled 2022-10-14 (×47): qty 2

## 2022-10-14 NOTE — Progress Notes (Signed)
[facility] Progress Note   Subjective:  seen in ICU, still looks a bit SOB but improving daily. Wt's down 78.9kg, net I/O yest was 2.3 L negative. Today 1.9 L neg so far. Tolerating 150- 175 cc/hr per RN. BP's are runnings 90s- 110s.  HFNC is down from 15 L yest am, to 8 L HFLC this am.   Objective Vitals:   10/14/22 0857 10/14/22 0900 10/14/22 1000 10/14/22 1100  BP:  101/66 101/73 105/66  Pulse:  96  95  Resp:  20 17 (!) 25  Temp:      TempSrc:      SpO2: 99% 100% 99% 97%  Weight:      Height:       Physical Exam General: Alert male in NAD, still w/ +JVD and mild improving ^wob Heart: RRR, no RG Lungs: CTA bilaterally, on O2 via [place] Abdomen: Soft, non-distended, +BS Extremities: bilat UE edema 1-2+ , no LE edema Dialysis Access:  TDC   OP HD:  NW TTS  3:45    450/A1.5x    2K/2Ca     85.7kg   TDC   Heparin 3000 units IV TIW  -No ESA -Calcitriol 1.5 MCG PO three times per week  Assessment/Plan: 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 ID RX  Blood Cx 6/16 MSSA, negative Cx on 6/17, 6/19. TDC removed 6/18 and had 72 hour line holiday. TTE was concerning for endocarditis. TEE cancelled d/t high risk factors. LS MRI with possible L5-S1 discitis. Will be getting IV Cefazolin 2g q HD x 6 weeks (until 11/04/22). Volume overload - pt had iHD last week tues, thursday, Friday and Sat (extra HD friday for [place]). Best UF last week was 2.5 L. Attempted HD on Sunday but pt didn't tolerate and was moved to ICU and started on bipap and CRRT.  This is the 3rd day of CRRT. BP's aren't great 90s- 110s but not requiring pressors. Got more vol off yest w/ net 2.3 L. CXR today  shows improving R> L perihilar edema. Pt still SOB and requiring HFNC 8 lpm. Cont UF w/ CRRT another 24 hrs or so, ^UF to 225-250 cc/hr. Deconditioning /weakness: was in CIR, was transferred back to 6E due to  medical complications ESRD: Usual TTS schedule.  Access: TDC  pulled 6/18 for line holiday, then new LIJ TDC placed 7/01 per IR. For perm access: s/p L AVF ligation in 2019. Last seen by Dr. Edilia Bo 01/2022 - limited mobility in R arm so planning for L arm graft but had not scheduled surgery yet. Can revisit as an outpatient once bacteremia resolved.  Anemia - Hgb 8.8. +FOBT, GI -recommended PPI. Hold heparin with HD. Per primary service. Aranesp q Thursday, increased next dose to .   Hyponatremia: managing with HD and UF. Improving.  Hypotension-  on midodrine.  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 Not on phos binder currently.    Nutrition - Renal diet, Alb low - continue supplements    Vinson Moselle  MD  [facility] 10/14/2022, 11:54 AM  Recent Labs  Lab 10/13/22 0108 10/13/22 1613 10/14/22 0402  HGB 8.2*  --  8.4*  ALBUMIN 2.3* 2.3* 2.4*  CALCIUM 8.5* 8.3* 8.5*  PHOS 2.7 2.2* 2.1*  CREATININE 3.60* 2.76* 2.29*  K 4.1 4.1 4.2    Inpatient medications:  (feeding supplement) PROSource Plus  30 mL Oral TID WC   arformoterol  15 mcg Nebulization BID   calcitRIOL  0.5 mcg Oral Q T,Th,Sat-1800  Chlorhexidine Gluconate Cloth  6 each Topical Q0600   Darbepoetin Alfa  150 mcg Subcutaneous Q Thu-1800   feeding supplement (NEPRO CARB STEADY)  237 mL Oral TID BM   heparin  5,000 Units Subcutaneous Q8H   methocarbamol  250 mg Oral BID   metoprolol tartrate  50 mg Oral Q8H   multivitamin  1 tablet Oral QHS   mouth rinse  15 mL Mouth Rinse 4 times per day   pantoprazole  40 mg Oral Daily   revefenacin  175 mcg Nebulization Daily     prismasol BGK 4/2.5 400 mL/hr at 10/14/22 0654    prismasol BGK 4/2.5 400 mL/hr at 10/14/22 0654   sodium chloride 5 mL/hr at 10/14/22 1100   ceFAZolin Stopped (10/14/22 0641)   dexmedetomidine (PRECEDEX) IV infusion 0.2 mcg/kg/hr (10/14/22 1100)   diltiazem (CARDIZEM) infusion Stopped (10/12/22 2201)   heparin 10,000 units/ 20 mL infusion syringe 750 Units/hr (10/14/22 1100)   prismasol BGK 4/2.5 1,500 mL/hr at 10/14/22 0615    sodium chloride, acetaminophen **OR** acetaminophen, antiseptic oral rinse, bisacodyl, guaiFENesin, guaiFENesin-codeine, heparin, hydrALAZINE, ipratropium-albuterol, ondansetron **OR** ondansetron (ZOFRAN) IV, mouth rinse, oxyCODONE, senna-docusate, traZODone

## 2022-10-14 NOTE — Plan of Care (Signed)
  Problem: Education: Goal: Knowledge of General Education information will improve Description Including pain rating scale, medication(s)/side effects and non-pharmacologic comfort measures Outcome: Progressing   Problem: Health Behavior/Discharge Planning: Goal: Ability to manage health-related needs will improve Outcome: Progressing   

## 2022-10-14 NOTE — Progress Notes (Signed)
NAME:  Ryan [name], MRN:  [id number], DOB:  1960/08/08, LOS: 5 ADMISSION DATE:  10/09/2022, CONSULTATION DATE:  10/09/2022 REFERRING MD: Rhona Leavens - [facility] CHIEF COMPLAINT:  SOB, AF with RVR  History of Present Illness:  62 year old man who presented to [facility] 7/18 from CIR for new-onset Afib with RVR. PMHx significant for HTN, HLD and ESRD on dialysis.  Initially presented to [facility] ED 6/16 with complaints of low back pain. Workup was significant for L5-S1 discitis/osteomyelitis with MSSA bacteremia.  ID was consulted and has been managing antibiotics and infectious workup; patient has been treated with cefazolin IV. Concern on TTE for a large, rounded structure on the anterior leaflet of the mitral valve.  Chest and brain imaging unfortunately consistent with septic embolization to both areas.  The appropriate specialties were involved and all opted to continue with conservative treatment of antibiotics for now (Ancef course to end 8/13).  Patient had been getting dialyzed through tunneled right IJ catheter which was removed for line holiday; this was replaced by temporary femoral access by IR which unfortunately became unable to be used, complicating dialysis session 6/29 which was unable to be completed.  Since that time the patient has become progressively SOB intermittently requiring BiPAP, felt to be due to volume overload/missed dialysis, however, CXR did demonstrate changes consistent with cavitary lesions. Discharged to CIR 7/4.  Patient was supposed to get hemodialysis 7/18 but went into A-fib with RVR with associated SOB, tachypnea, hypoxia and hemodialysis was held. PCCM was consulted for assistance with medical management.  Pertinent Medical History:   Past Medical History:  Diagnosis Date   Allergy    Anemia    Blood transfusion without reported diagnosis    Dialysis patient (HCC)    Tues,thurs,sat   ESRD (end stage renal disease) (HCC)    TTHS-    Family history of adverse reaction  to anesthesia    father had allergic reaction with anesthesia with a dental procedure-(pt. doesn't know)   Hyperlipidemia    Hypertension    Significant Hospital Events: Including procedures, antibiotic start and stop dates in addition to other pertinent events   6/16 - 7/4 - [facility] admission for L5-S1 osteomyelitis, MSSA bacteremia 7/4 - Admitted to CIR 7/18 - Readmitted to [facility] for Afib RVR/SOB requiring BiPAP, inability to complete HD. 7/22 - Off of BiPAP, tolerating Salter. Remains anxious. Tolerating CRRT.  Interim History / Subjective:  No significant events overnight Slept better Down to Salter 8L Brovana/Yupelri added back Tolerating CRRT well  Objective:  Blood pressure 107/66, pulse 98, temperature 97.6 F (36.4 C), temperature source Axillary, resp. rate 18, height 5\' 9"  (1.753 m), weight 78.9 kg, SpO2 98%.        Intake/Output Summary (Last 24 hours) at 10/14/2022 0807 Last data filed at 10/14/2022 0700 Gross per 24 hour  Intake 768.93 ml  Output 4004 ml  Net -3235.07 ml   Filed Weights   10/12/22 1500 10/13/22 0645 10/14/22 0500  Weight: 81.8 kg 80.1 kg 78.9 kg   Physical Examination: General: Acutely ill-appearing middle-aged man in NAD. Pleasant and conversant, less anxious. HEENT: [place]/AT, anicteric sclera, PERRL, moist mucous membranes. Neuro: Awake, oriented x 4. Responds to verbal stimuli. Following commands consistently. Moves all 4 extremities spontaneously. CV: RRR, no m/g/r. PULM: Breathing even and unlabored on 8L [place]. Lung fields CTAB, improved from prior exam, faint bibasilar crackles. GI: Soft, nontender, nondistended. Normoactive bowel sounds. Extremities: No LE edema noted. Skin: Warm/dry, no rashes.  Resolved Hospital Problem List:  Assessment & Plan:  Acute respiratory failure with hypoxia Pulmonary edema Acute-on-chronic HFpEF Echo 08/2022 EF 55-60%, moderate LVH, G1DD. - Supplemental O2 support, currently tolerating Salter 10-15L -  Wean O2 for sat > 90% - BiPAP PRN - Bronchodilators (Brovana/Yupelri, DuoNeb PRN) - Pulmonary hygiene - PAD protocol for sedation: Precedex for goal RASS 0 to -1, can add PRN to help wean from Precedex  End-stage renal disease on hemodialysis Hypophosphatemia Hypervolemic hyponatremia - Nephrology following, appreciate assistance - CRRT per Nephro, transition to iHD as tolerated once hemodynamics better; still has significant extra volume on board; increase UF as able - Trend BMP - Replete electrolytes as indicated - Monitor I&Os - Avoid nephrotoxic agents as able - Ensure adequate renal perfusion  Paroxysmal A-fib with RVR CHA2DS2-VASc Score 2. 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 - S/p Zosyn course - Continue IV Ancef (end 8/13)  Best Practice (right click and "Reselect all SmartList Selections" daily)   Diet/type: Regular consistency (see orders) DVT prophylaxis: prophylactic heparin  GI prophylaxis: PPI Central venous access:  Yes, and it is still needed - HD catheter Foley:  N/A Code Status:  full code Last date of multidisciplinary goals of care discussion [Pending]  Critical care time:    The patient is critically ill with multiple organ system failure and requires high complexity decision making for assessment and support, frequent evaluation and titration of therapies, advanced  monitoring, review of radiographic studies and interpretation of complex data.   Critical Care Time devoted to patient care services, exclusive of separately billable procedures, described in this note is 34 minutes.  Tim Lair, PA-C [facility] 10/14/22 8:07 AM  Please see [email or web address] for pager details.  From 7A-7P if no response, please call [phone] After hours, please call ELink [phone]

## 2022-10-15 DIAGNOSIS — I39 Endocarditis and heart valve disorders in diseases classified elsewhere: Secondary | ICD-10-CM | POA: Diagnosis not present

## 2022-10-15 DIAGNOSIS — I4891 Unspecified atrial fibrillation: Secondary | ICD-10-CM | POA: Diagnosis not present

## 2022-10-15 DIAGNOSIS — J9601 Acute respiratory failure with hypoxia: Secondary | ICD-10-CM | POA: Diagnosis not present

## 2022-10-15 LAB — RENAL FUNCTION PANEL
Albumin: 2.4 g/dL — ABNORMAL LOW (ref 3.5–5.0)
Anion gap: 12 (ref 5–15)
BUN: 17 mg/dL (ref 8–23)
CO2: 21 mmol/L — ABNORMAL LOW (ref 22–32)
Calcium: 8.8 mg/dL — ABNORMAL LOW (ref 8.9–10.3)
Chloride: 99 mmol/L (ref 98–111)
Creatinine, Ser: 2.17 mg/dL — ABNORMAL HIGH (ref 0.61–1.24)
GFR, Estimated: 34 mL/min — ABNORMAL LOW
Glucose, Bld: 112 mg/dL — ABNORMAL HIGH (ref 70–99)
Phosphorus: 2 mg/dL — ABNORMAL LOW (ref 2.5–4.6)
Potassium: 4.8 mmol/L (ref 3.5–5.1)
Sodium: 132 mmol/L — ABNORMAL LOW (ref 135–145)

## 2022-10-15 LAB — CBC
HCT: 31.1 % — ABNORMAL LOW (ref 39.0–52.0)
Hemoglobin: 9.5 g/dL — ABNORMAL LOW (ref 13.0–17.0)
MCH: 26.8 pg (ref 26.0–34.0)
MCHC: 30.5 g/dL (ref 30.0–36.0)
MCV: 87.6 fL (ref 80.0–100.0)
Platelets: 243 K/uL (ref 150–400)
RBC: 3.55 MIL/uL — ABNORMAL LOW (ref 4.22–5.81)
RDW: 20.7 % — ABNORMAL HIGH (ref 11.5–15.5)
WBC: 13.4 K/uL — ABNORMAL HIGH (ref 4.0–10.5)
nRBC: 1.9 % — ABNORMAL HIGH (ref 0.0–0.2)

## 2022-10-15 LAB — APTT: aPTT: 47 s — ABNORMAL HIGH (ref 24–36)

## 2022-10-15 LAB — MAGNESIUM: Magnesium: 2.7 mg/dL — ABNORMAL HIGH (ref 1.7–2.4)

## 2022-10-15 MED ORDER — CEFAZOLIN SODIUM-DEXTROSE 1-4 GM/50ML-% IV SOLN
1.0000 g | INTRAVENOUS | Status: DC
  Administered 2022-10-16 – 2022-10-20 (×5): 1 g via INTRAVENOUS
  Filled 2022-10-15 (×5): qty 50

## 2022-10-15 NOTE — Progress Notes (Signed)
PHARMACY NOTE:  ANTIMICROBIAL RENAL DOSAGE ADJUSTMENT  Current antimicrobial regimen includes a mismatch between antimicrobial dosage and estimated renal function.  As per policy approved by the Pharmacy & Therapeutics and Medical Executive Committees, the antimicrobial dosage will be adjusted accordingly.  Current antimicrobial dosage:  Ancef 2gm IV q12h  Indication: discitis/osteomyelitis   Renal Function:  Estimated Creatinine Clearance: 35.7 mL/min (A) (by C-G formula based on SCr of 2.17 mg/dL (H)). [x]      On intermittent HD, scheduled: []      On CRRT    Antimicrobial dosage has been changed to:  Ancef 1g, IV q24hr  Additional comments:   Thank you for allowing pharmacy to be a part of this patient's care.  Harland German, PharmD Clinical Pharmacist **Pharmacist phone directory can now be found on [email or web address] ([email or web address]).  Listed under [facility].

## 2022-10-15 NOTE — TOC Initial Note (Signed)
Transition of Care [facility]) - Initial/Assessment Note    Patient Details  Name: Ryan [name] MRN: [id number] Date of Birth: [birth year]  Transition [facility]) CM/SW Contact:    Elliot Cousin, RN Phone Number: [phone] 10/15/2022, 1:15 PM  Clinical Narrative:   TOC CM spoke to pt and states he wants to go back to complete IP rehab.              Expected Discharge Plan: IP Rehab Facility Barriers to Discharge: Continued Medical Work up   Patient Goals and CMS Choice Patient states their goals for this hospitalization and ongoing recovery are:: wants to complete CIR CMS Medicare.gov Compare Post Acute Care list provided to:: Patient        Expected Discharge Plan and Services   Discharge Planning Services: CM Consult Post Acute Care Choice: IP Rehab Living arrangements for the past 2 months: Single Family Home                                      Prior Living Arrangements/Services Living arrangements for the past 2 months: Single Family Home Lives with:: Adult Children Patient language and need for interpreter reviewed:: Yes Do you feel safe going back to the place where you live?: Yes      Need for Family Participation in Patient Care: Yes (Comment) Care giver support system in place?: Yes (comment)   Criminal Activity/Legal Involvement Pertinent to Current Situation/Hospitalization: No - Comment as needed  Activities of Daily Living Home Assistive Devices/Equipment: Walker (specify type) ADL Screening (condition at time of admission) Patient's cognitive ability adequate to safely complete daily activities?: Yes Is the patient deaf or have difficulty hearing?: No Does the patient have difficulty seeing, even when wearing glasses/contacts?: No Does the patient have difficulty concentrating, remembering, or making decisions?: No Patient able to express need for assistance with ADLs?: Yes Does the patient have difficulty dressing or bathing?:  No Independently performs ADLs?: No Walks in Home: Independent Does the patient have difficulty walking or climbing stairs?: Yes Weakness of Legs: Both Weakness of Arms/Hands: None  Permission Sought/Granted Permission sought to share information with : Case Manager, Family Supports Permission granted to share information with : Yes, Verbal Permission Granted  Share Information with NAME: Erikson Danzy     Permission granted to share info w Relationship: son  Permission granted to share info w Contact Information: [phone]  Emotional Assessment Appearance:: Appears stated age Attitude/Demeanor/Rapport: Engaged Affect (typically observed): Accepting Orientation: : Oriented to Self, Oriented to Place, Oriented to  Time, Oriented to Situation Alcohol / Substance Use: Not Applicable Psych Involvement: No (comment)  Admission diagnosis:  Atrial fibrillation with RVR (HCC) [I48.91] Patient Active Problem List   Diagnosis Date Noted   Heme positive stool 10/11/2022   Acute and subacute infective endocarditis in diseases classified elsewhere 10/11/2022   Atrial fibrillation with RVR (HCC) 10/09/2022   Leukocytosis 10/02/2022   Acute osteomyelitis of lumbar spine (HCC) 09/26/2022   Diskitis 09/25/2022   Malnutrition of moderate degree 09/24/2022   MSSA bacteremia 09/08/2022   Discitis of lumbosacral region 09/08/2022   Acute low back pain 09/07/2022   Lung nodules 09/07/2022   Elevated troponin 09/07/2022   SIRS (systemic inflammatory response syndrome) (HCC) 09/07/2022   Hypocalcemia 04/24/2020   COVID-19 01/20/2020   Diverticulitis of colon with bleeding 03/09/2019   Complication of vascular dialysis catheter 12/27/2018  Iron deficiency anemia, unspecified 05/06/2018   Acute hypoxic respiratory failure (HCC) 12/25/2017   Cough 10/30/2017   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   Chronic diastolic heart failure, NYHA class 3 (HCC)  10/18/2017   Hyperlipidemia 09/19/2017   Shortness of breath 09/19/2017   PND (paroxysmal nocturnal dyspnea) 09/14/2017   DOE (dyspnea on exertion) 09/14/2017   Recurrent right pleural effusion 08/28/2017   ESRD on dialysis (HCC) 08/27/2017   Lesion of vertebra 07/13/2017   Mild protein-calorie malnutrition (HCC) 05/23/2017   Fluid overload, unspecified 05/06/2017   Hyperkalemia 05/01/2017   Personal history of urinary (tract) infections 01/15/2017   Encounter for fitting and adjustment of extracorporeal dialysis catheter (HCC) 09/27/2016   Coagulation defect, unspecified (HCC) 08/28/2016   Kidney transplant failure 08/28/2016   Secondary hyperparathyroidism of renal origin (HCC) 08/28/2016   Type 2 diabetes mellitus with diabetic chronic kidney disease (HCC) 08/28/2016   Renal transplant, status post 08/26/2016   Hypertension 08/26/2016   Anemia of chronic disease 04/09/2016   EKG abnormalities    Other hydronephrosis 03/04/2016   PCP:  Sharin Grave, MD Pharmacy:   [id number] [place], [place] - [address] [place] [address] Phone: [phone] Fax: [phone]     Social Determinants of Health (SDOH) Social History: SDOH Screenings   Food Insecurity: No Food Insecurity (10/09/2022)  Housing: Low Risk  (10/09/2022)  Transportation Needs: No Transportation Needs (10/09/2022)  Utilities: Not At Risk (10/09/2022)  Financial Resource Strain: Medium Risk (05/16/2022)   Received from [facility], [facility]  Tobacco Use: Low Risk  (10/09/2022)   SDOH Interventions:     Readmission Risk Interventions    09/10/2022    1:53 PM  Readmission Risk Prevention Plan  Transportation Screening Complete  PCP or Specialist Appt within 5-7 Days Complete  Home Care Screening Complete  Medication Review (RN CM) Referral to Pharmacy

## 2022-10-15 NOTE — Plan of Care (Signed)
  Problem: Education: Goal: Knowledge of General Education information will improve Description: Including pain rating scale, medication(s)/side effects and non-pharmacologic comfort measures Outcome: Progressing   Problem: Health Behavior/Discharge Planning: Goal: Ability to manage health-related needs will improve Outcome: Progressing   Problem: Clinical Measurements: Goal: Respiratory complications will improve Outcome: Progressing   Problem: Clinical Measurements: Goal: Cardiovascular complication will be avoided Outcome: Progressing   Problem: Activity: Goal: Risk for activity intolerance will decrease Outcome: Progressing   Problem: Coping: Goal: Level of anxiety will decrease Outcome: Progressing   Problem: Pain Managment: Goal: General experience of comfort will improve Outcome: Progressing   Problem: Safety: Goal: Ability to remain free from injury will improve Outcome: Progressing   Problem: Skin Integrity: Goal: Risk for impaired skin integrity will decrease Outcome: Progressing

## 2022-10-15 NOTE — Progress Notes (Signed)
NAME:  Ryan [name], MRN:  [id number], DOB:  [birth year], LOS: 6 ADMISSION DATE:  10/09/2022, CONSULTATION DATE:  10/09/2022 REFERRING MD: Rhona Leavens - TR[facility] CHIEF COMPLAINT:  SOB, AF with RVR  History of Present Illness:  62 year old man who presented to [facility] 7/18 from CIR for new-onset Afib with RVR. PMHx significant for HTN, HLD and ESRD on dialysis.  Initially presented to [facility] ED 6/16 with complaints of low back pain. Workup was significant for L5-S1 discitis/osteomyelitis with MSSA bacteremia.  ID was consulted and has been managing antibiotics and infectious workup; patient has been treated with cefazolin IV. Concern on TTE for a large, rounded structure on the anterior leaflet of the mitral valve.  Chest and brain imaging unfortunately consistent with septic embolization to both areas.  The appropriate specialties were involved and all opted to continue with conservative treatment of antibiotics for now (Ancef course to end 8/13).  Patient had been getting dialyzed through tunneled right IJ catheter which was removed for line holiday; this was replaced by temporary femoral access by IR which unfortunately became unable to be used, complicating dialysis session 6/29 which was unable to be completed.  Since that time the patient has become progressively SOB intermittently requiring BiPAP, felt to be due to volume overload/missed dialysis, however, CXR did demonstrate changes consistent with cavitary lesions. Discharged to CIR 7/4.  Patient was supposed to get hemodialysis 7/18 but went into A-fib with RVR with associated SOB, tachypnea, hypoxia and hemodialysis was held. PCCM was consulted for assistance with medical management.  Pertinent Medical History:   Past Medical History:  Diagnosis Date   Allergy    Anemia    Blood transfusion without reported diagnosis    Dialysis patient (HCC)    Tues,thurs,sat   ESRD (end stage renal disease) (HCC)    TTHS-    Family history of adverse reaction  to anesthesia    father had allergic reaction with anesthesia with a dental procedure-(pt. doesn't know)   Hyperlipidemia    Hypertension    Significant Hospital Events: Including procedures, antibiotic start and stop dates in addition to other pertinent events   6/16 - 7/4 - [facility] admission for L5-S1 osteomyelitis, MSSA bacteremia 7/4 - Admitted to CIR 7/18 - Readmitted to [facility] for Afib RVR/SOB requiring BiPAP, inability to complete HD. 7/22 - Off of BiPAP, tolerating Salter. Remains anxious. Tolerating CRRT. 7/24 BP down-trending overnight, transitioned off CRRT  Interim History / Subjective:  BP down-trending with pulling volume by CRRT, transitioned off this morning and BP recovered Pt states that his breathing has improved  Off precedex   Objective:  Blood pressure (!) 96/56, pulse 87, temperature 98 F (36.7 C), temperature source Axillary, resp. rate (!) 33, height 5\' 9"  (1.753 m), weight 73.1 kg, SpO2 95%.        Intake/Output Summary (Last 24 hours) at 10/15/2022 0817 Last data filed at 10/15/2022 0801 Gross per 24 hour  Intake 726.13 ml  Output 5070 ml  Net -4343.87 ml   Filed Weights   10/13/22 0645 10/14/22 0500 10/15/22 0500  Weight: 80.1 kg 78.9 kg 73.1 kg    General:  well-nourished M, fatigued appearing in no acute distress HEENT: MM pink/moist Neuro: alert and oriented  CV: s1s2 rrr, no m/r/g, tunneled  L chest HD catheter PULM:  clear bilaterally on [place], no tachypnea or accessory muscle use  GI: soft, non-tender  Extremities: warm/dry, no edema  Skin: no rashes or lesions   Labs Hgb 9.5 Creatinine 2.17  Bicarb 21 Creatinine 2.17   Resolved Hospital Problem List:     Assessment & Plan:     Acute respiratory failure with hypoxia Pulmonary edema Acute-on-chronic HFpEF Echo 08/2022 EF 55-60%, moderate LVH, G1DD. -improving, now on 6L  - Supplemental O2 support - Wean O2 for sat > 90% - BiPAP PRN - Bronchodilators (Brovana/Yupelri, DuoNeb  PRN) - Pulmonary hygiene -weight down 17lbs since admission -stable for transition out of ICU today  End-stage renal disease on hemodialysis Hypophosphatemia Hypervolemic hyponatremia - Nephrology following, appreciate assistance -off CRRT today, weight significantly down and becoming hypotensive -transition to iHD - Trend BMP - Replete electrolytes as indicated - Monitor I&Os    Paroxysmal A-fib with RVR CHA2DS2-VASc Score 2. 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 - S/p Zosyn course - Continue IV Ancef (end 8/13)  Best Practice (right click and "Reselect all SmartList Selections" daily)   Diet/type: Regular consistency (see orders) DVT prophylaxis: prophylactic heparin  GI prophylaxis: PPI Central venous access:  Yes, and it is still needed - HD catheter Foley:  N/A Code Status:  full code Last date of multidisciplinary goals of care discussion [Pending, improving ]  Critical care time:     Darcella Gasman Malcolm Hetz, PA-C [facility] See Amion for pager If no response to pager , please call [phone] until 7pm After 7:00 pm call Elink  [phone]

## 2022-10-15 NOTE — Progress Notes (Addendum)
Called [facility] [place] to inform that patient is transferred to room [phone]. No answer. Left a voice message.

## 2022-10-15 NOTE — Progress Notes (Signed)
Pt currently resting on 6l salter. No resp distress noted at th is time. Bipap by bedside. Will continue to monitor.

## 2022-10-15 NOTE — Progress Notes (Signed)
[facility] Progress Note   Subjective:  seen in ICU, SOB better, net neg 4.4 L yesterday and pt is cramping and dropping his BP's. Will dc CRRT.   Objective Vitals:   10/15/22 0800 10/15/22 0927 10/15/22 1000 10/15/22 1125  BP: (!) 96/56  104/65   Pulse: 87     Resp: (!) 33  (!) 24   Temp:  97.6 F (36.4 C)  97.8 F (36.6 C)  TempSrc:  Oral  Oral  SpO2: 95%     Weight:      Height:       Physical Exam General: Alert male in NAD, still w/ +JVD and mild improving ^wob Heart: RRR, no RG Lungs: CTA bilaterally, on O2 via [place] Abdomen: Soft, non-distended, +BS Extremities: bilat UE edema 1-2+ , no LE edema Dialysis Access:  TDC   OP HD:  [facility] TTS  3h  85.7kg     450/A1.5x    2K/2Ca   TDC   Heparin 3000 -No ESA -Calcitriol 1.5 MCG PO three times per week  Assessment/Plan: 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 ID RX  Blood Cx 6/16 MSSA, negative Cx on 6/17, 6/19. TDC removed 6/18 and had 72 hour line holiday. TTE was concerning for endocarditis. TEE cancelled d/t high risk factors. LS MRI with possible L5-S1 discitis. Will be getting IV Cefazolin 2g q HD x 6 weeks (until 11/04/22). Volume overload - pt had iHD last week tues, thursday, Friday and Sat (extra HD friday for [place]). Best UF last week was 2.5 L. Attempted HD on Sunday but pt didn't tolerate and was moved to ICU and started on bipap and CRRT.  Moved to ICU and started on CRRT. Had very good luck w/ net neg 4 L overnight and now pt is cramping and BP's dropped, signalling euvolemia (or hypovolemia). Will dc CRRT and resume iHD now that volume overload episode has resolved. Wt's are down significantly and this is likely due to lean body wt loss. Will get standing wt when he can stand.  Deconditioning /weakness: was in CIR, was transferred back to 6E due to  medical complications ESRD: Usual TTS schedule. Did CRRT 7.21 - today 7/24. Dc CRRT now, next iHD Friday or Sat.   Access: TDC pulled 6/18 for line holiday, then new LIJ TDC placed 7/01 per IR. For perm access: s/p L AVF ligation in 2019. Last seen by Dr. Edilia Bo 01/2022 - limited mobility in R arm so planning for L arm graft but had not scheduled surgery yet. Can revisit as an outpatient once bacteremia resolved.  Anemia - Hgb 8.8. +FOBT, GI -recommended PPI. Hold heparin with HD. Per primary service. Aranesp q Thursday, increased next dose to .   Hyponatremia: managing with HD and UF. Improving.  Hypotension-  on midodrine.  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 Not on phos binder currently.    Nutrition - Renal diet, Alb low - continue supplements    Vinson Moselle  MD  [facility] 10/15/2022, 12:12 PM  Recent Labs  Lab 10/14/22 0402 10/14/22 1557 10/15/22 0556  HGB 8.4*  --  9.5*  ALBUMIN 2.4* 2.5* 2.4*  CALCIUM 8.5* 8.7* 8.8*  PHOS 2.1* 1.6* 2.0*  CREATININE 2.29* 2.18* 2.17*  K 4.2 4.0 4.8    Inpatient medications:  (feeding supplement) PROSource Plus  30 mL Oral TID WC   arformoterol  15 mcg Nebulization BID   calcitRIOL  0.5 mcg Oral Q T,Th,Sat-1800   Chlorhexidine Gluconate Cloth  6 each Topical Q0600   Darbepoetin Alfa  150 mcg Subcutaneous Q Thu-1800   feeding supplement (NEPRO CARB STEADY)  237 mL Oral TID BM   heparin  5,000 Units Subcutaneous Q8H   methocarbamol  250 mg Oral BID   metoprolol tartrate  50 mg Oral Q8H   multivitamin  1 tablet Oral QHS   mouth rinse  15 mL Mouth Rinse 4 times per day   pantoprazole  40 mg Oral Daily   revefenacin  175 mcg Nebulization Daily     prismasol BGK 4/2.5 400 mL/hr at 10/15/22 0758    prismasol BGK 4/2.5 400 mL/hr at 10/15/22 0758   sodium chloride 5 mL/hr at 10/15/22 0900   ceFAZolin Stopped (10/15/22 0602)   dexmedetomidine (PRECEDEX) IV infusion Stopped (10/15/22 0736)   heparin 10,000 units/ 20 mL infusion syringe 750 Units/hr (10/15/22 0025)   prismasol BGK 4/2.5 1,500 mL/hr at 10/15/22 0523   sodium chloride, acetaminophen **OR**  acetaminophen, antiseptic oral rinse, bisacodyl, guaiFENesin, guaiFENesin-codeine, heparin, hydrALAZINE, ipratropium-albuterol, ondansetron **OR** ondansetron (ZOFRAN) IV, mouth rinse, oxyCODONE, senna-docusate, traZODone

## 2022-10-16 DIAGNOSIS — I4891 Unspecified atrial fibrillation: Secondary | ICD-10-CM | POA: Diagnosis not present

## 2022-10-16 LAB — BASIC METABOLIC PANEL
Anion gap: 13 (ref 5–15)
BUN: 34 mg/dL — ABNORMAL HIGH (ref 8–23)
CO2: 22 mmol/L (ref 22–32)
Calcium: 8.8 mg/dL — ABNORMAL LOW (ref 8.9–10.3)
Chloride: 95 mmol/L — ABNORMAL LOW (ref 98–111)
Creatinine, Ser: 3.53 mg/dL — ABNORMAL HIGH (ref 0.61–1.24)
Glucose, Bld: 125 mg/dL — ABNORMAL HIGH (ref 70–99)
Potassium: 4 mmol/L (ref 3.5–5.1)
Sodium: 130 mmol/L — ABNORMAL LOW (ref 135–145)

## 2022-10-16 LAB — CBC
HCT: 32.4 % — ABNORMAL LOW (ref 39.0–52.0)
Hemoglobin: 9.6 g/dL — ABNORMAL LOW (ref 13.0–17.0)
MCH: 25.3 pg — ABNORMAL LOW (ref 26.0–34.0)
MCHC: 29.6 g/dL — ABNORMAL LOW (ref 30.0–36.0)
MCV: 85.3 fL (ref 80.0–100.0)
Platelets: 245 10*3/uL (ref 150–400)
RBC: 3.8 MIL/uL — ABNORMAL LOW (ref 4.22–5.81)
WBC: 14.4 10*3/uL — ABNORMAL HIGH (ref 4.0–10.5)
nRBC: 0.9 % — ABNORMAL HIGH (ref 0.0–0.2)

## 2022-10-16 LAB — MAGNESIUM: Magnesium: 2.5 mg/dL — ABNORMAL HIGH (ref 1.7–2.4)

## 2022-10-16 LAB — PHOSPHORUS: Phosphorus: 2.4 mg/dL — ABNORMAL LOW (ref 2.5–4.6)

## 2022-10-16 MED ORDER — LACTATED RINGERS IV BOLUS: 250.0000 mL | Freq: Once | INTRAVENOUS | Status: DC

## 2022-10-16 MED ORDER — MIDODRINE HCL 5 MG PO TABS
10.0000 mg | ORAL_TABLET | Freq: Two times a day (BID) | ORAL | Status: DC
  Administered 2022-10-16 – 2022-10-17 (×3): 10 mg via ORAL
  Filled 2022-10-16 (×3): qty 2

## 2022-10-16 MED ORDER — OLANZAPINE 5 MG PO TABS
5.0000 mg | ORAL_TABLET | Freq: Once | ORAL | Status: AC
  Administered 2022-10-16: 5 mg via ORAL
  Filled 2022-10-16: qty 1

## 2022-10-16 NOTE — Progress Notes (Signed)
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  Patient initially presented on 08/28/2022 with low back pain, found to have evidence of L5-S1 discitis and osteomyelitis, MSSA bacteremia with mitral valve endocarditis.  CT of the chest showed septic emboli with a new airspace opacities in the left upper lobe.  MRI of the brain noting scattered subcentimeter infarcts involving the bilateral cerebral hemisphere and right cerebellum.  CT surgery has been consulted, recommended conservative management as patient already started responding to antibiotics.  Patient was recommended to continue with cefazolin until 11/22/2022 per ID recommendations.  Patient also had small bowel obstruction during that hospital stay, was discharged to rehab on 09/25/2022.  Patient started to experience cough on 10/02/2022, diagnosed with a pneumonia/aspiration pneumonia, was treated with 5 days of IV Zosyn.  Patient was noted to be fluid overloaded, being managed at the hemodialysis.  Patient also had dysphagia, drop in hemoglobin to 6.1 with a stool occult being positive.  Patient was seen by GI, esophagram performed which was negative for any acute pathology.  On 10/09/2022 developed atrial fibrillation with rapid ventricular rate, considering this patient is transferred to [facility].  EKG  showed first-degree AV block, sinus tachycardia.  Patient was found to be in any respiratory distress due to combination of, pneumonia.  Patient was started on Zosyn, transition to cefazolin.  Patient developed respiratory failure, transferred to ICU, underwent CRRT.  Patient lost 17 pounds since admission.  Acute hypoxic respiratory failure -Secondary to fluid overload, pneumonia -Continue with cefazolin until 11/04/2022 -Underwent hemodialysis continues to have improvement.  Pneumonia secondary to septic emboli due to endocarditis -Continue the cefazolin -Continue the DuoNebs, Solu-Medrol.  ESRD on HD -Continue with the hemodialysis -Nephrology is following with the patient.  Paroxysmal atrial fibrillation with rapid ventricular rate -Patient required Cardizem drip -Currently rate is controlled with the metoprolol every 8 hours -Closely follow-up with the potassium greater than 4, magnesium greater than 2.  Dysphagia -Evaluated by speech therapy, cleared for dysphagia diet with thin liquids.  Anemia of chronic disease, chronic kidney disease -Closely monitor CBC -Transfuse for hemoglobin less than 7.  Infectious endocarditis of mitral valve -Patient had extensive septic embolization to the lungs and brain -MSSA bacteremia  L5-S1 discitis with osteomyelitis -Continue with the current antibiotic regimen -Continue with the cefazolin until 11/04/2022   DVT prophylaxis: Heparin sq Code Status:Full code Family Communication:No family Disposition Plan:    Status is: Inpatient Remains inpatient appropriate because: Continued inpatient workup.     Consultants:  Cardiology Nephrology  Procedures:  Antimicrobials: Anti-infectives (From admission, onward)    Start     Dose/Rate Route Frequency Ordered Stop   10/16/22 2000  ceFAZolin (ANCEF) IVPB 1 g/50 mL premix        1 g 100 mL/hr over 30 Minutes Intravenous Every 24 hours 10/15/22 1355  10/12/22 1800  ceFAZolin  (ANCEF) IVPB 2g/100 mL premix  Status:  Discontinued        2 g 200 mL/hr over 30 Minutes Intravenous Every 12 hours 10/12/22 1557 10/15/22 1355   10/09/22 1800  ceFAZolin (ANCEF) IVPB 2g/100 mL premix  Status:  Discontinued        2 g 200 mL/hr over 30 Minutes Intravenous Every T-Th-Sa (1800) 10/09/22 1619 10/12/22 1557       Subjective: Patient is feeling better with the shortness of breath.  Tolerating diet well.  Encouraged patient to sit in the chair as tolerated.  Patient's blood pressure is low normal side.  Started the patient on midodrine.  Per nursing staff heart rate goes up to 140s to 150s when stands up and walking. Objective: Vitals:   10/16/22 0946 10/16/22 1023 10/16/22 1107 10/16/22 1239  BP: 104/84 (!) 83/58 113/75 (!) 83/54  Pulse: (!) 101 99    Resp:    20  Temp:    97.9 F (36.6 C)  TempSrc:    Oral  SpO2: 97% 99%    Weight:      Height:        Intake/Output Summary (Last 24 hours) at 10/16/2022 1315 Last data filed at 10/16/2022 0957 Gross per 24 hour  Intake 360 ml  Output --  Net 360 ml   Filed Weights   10/14/22 0500 10/15/22 0500 10/15/22 2100  Weight: 78.9 kg 73.1 kg 74.1 kg    Examination:  General exam: Appears comfortable, deconditioned, not in any acute distress. Respiratory system: Clear to auscultation. Respiratory effort normal. RR 12, Left port left upper chest. Cardiovascular system: S1 & S2 heard, RRR. No JVD, murmurs, rubs, gallops or clicks.  Gastrointestinal system: Abdomen is nondistended, soft and nontender. Normal bowel sounds heard. Central nervous system: Alert and oriented x3. No focal neurological deficits. Extremities: No edema, no cyanosis, no clubbing Skin: No rashes, lesions or ulcers Psychiatry: Judgement and insight appear normal. Mood & affect appropriate.     Data Reviewed: I have personally reviewed following labs and imaging studies  CBC: Recent Labs  Lab 10/12/22 1330 10/13/22 0108 10/14/22 0402  10/15/22 0556 10/16/22 0008  WBC 16.7* 14.6* 12.0* 13.4* 14.4*  HGB 9.1* 8.2* 8.4* 9.5* 9.6*  HCT 30.2* 27.5* 28.2* 31.1* 32.4*  MCV 84.6 85.4 85.5 87.6 85.3  PLT 412* 289 286 243 245   Basic Metabolic Panel: Recent Labs  Lab 10/12/22 0251 10/12/22 1330 10/13/22 0108 10/13/22 1613 10/14/22 0402 10/14/22 1557 10/15/22 0556 10/16/22 0008  NA 127*   < > 133* 132* 133* 132* 132* 130*  K 3.3*   < > 4.1 4.1 4.2 4.0 4.8 4.0  CL 93*   < > 94* 100 98 96* 99 95*  CO2 23   < > 23 24 23 23  21* 22  GLUCOSE 124*   < > 108* 111* 115* 123* 112* 125*  BUN 22   < > 19 13 13 11 17  34*  CREATININE 4.06*   < > 3.60* 2.76* 2.29* 2.18* 2.17* 3.53*  CALCIUM 8.5*   < > 8.5* 8.3* 8.5* 8.7* 8.8* 8.8*  MG 1.6*  --  2.2  --  2.4  --  2.7* 2.5*  PHOS 2.4*   < > 2.7 2.2* 2.1* 1.6* 2.0* 2.4*   < > = values in this interval not displayed.   GFR: Estimated Creatinine Clearance: 22 mL/min (A) (by C-G formula based on SCr of 3.53 mg/dL (H)). Liver Function  Tests: Recent Labs  Lab 10/10/22 1300 10/10/22 1829 10/13/22 0108 10/13/22 1613 10/14/22 0402 10/14/22 1557 10/15/22 0556  AST 41  --   --   --   --   --   --   ALT 7  --   --   --   --   --   --   ALKPHOS 107  --   --   --   --   --   --   BILITOT 0.7  --   --   --   --   --   --   PROT 7.6  --   --   --   --   --   --   ALBUMIN 2.0*   < > 2.3* 2.3* 2.4* 2.5* 2.4*   < > = values in this interval not displayed.   No results for input(s): "LIPASE", "AMYLASE" in the last 168 hours. No results for input(s): "AMMONIA" in the last 168 hours. Coagulation Profile: Recent Labs  Lab 10/10/22 1829  INR 1.4*   Cardiac Enzymes: No results for input(s): "CKTOTAL", "CKMB", "CKMBINDEX", "TROPONINI" in the last 168 hours. BNP (last 3 results) No results for input(s): "PROBNP" in the last 8760 hours. HbA1C: No results for input(s): "HGBA1C" in the last 72 hours. CBG: Recent Labs  Lab 10/14/22 1613  GLUCAP 131*   Lipid Profile: No results for  input(s): "CHOL", "HDL", "LDLCALC", "TRIG", "CHOLHDL", "LDLDIRECT" in the last 72 hours. Thyroid Function Tests: No results for input(s): "TSH", "T4TOTAL", "FREET4", "T3FREE", "THYROIDAB" in the last 72 hours.  Anemia Panel: No results for input(s): "VITAMINB12", "FOLATE", "FERRITIN", "TIBC", "IRON", "RETICCTPCT" in the last 72 hours. Sepsis Labs: No results for input(s): "PROCALCITON", "LATICACIDVEN" in the last 168 hours.   Recent Results (from the past 240 hour(s))  MRSA Next Gen by PCR, Nasal     Status: None   Collection Time: 10/12/22  3:03 PM   Specimen: Nasal Mucosa; Nasal Swab  Result Value Ref Range Status   MRSA by PCR Next Gen NOT DETECTED NOT DETECTED Final    Comment: (NOTE) The GeneXpert MRSA Assay (FDA approved for NASAL specimens only), is one component of a comprehensive MRSA colonization surveillance program. It is not intended to diagnose MRSA infection nor to guide or monitor treatment for MRSA infections. Test performance is not FDA approved in patients less than 45 years old. Performed at [facility], [address] [place], [place] [address]     Radiology Studies: No results found.   Scheduled Meds:  (feeding supplement) PROSource Plus  30 mL Oral TID WC   arformoterol  15 mcg Nebulization BID   calcitRIOL  0.5 mcg Oral Q T,Th,Sat-1800   Chlorhexidine Gluconate Cloth  6 each Topical Q0600   Darbepoetin Alfa  150 mcg Subcutaneous Q Thu-1800   feeding supplement (NEPRO CARB STEADY)  237 mL Oral TID BM   heparin  5,000 Units Subcutaneous Q8H   methocarbamol  250 mg Oral BID   metoprolol tartrate  50 mg Oral Q8H   midodrine  10 mg Oral BID WC   multivitamin  1 tablet Oral QHS   mouth rinse  15 mL Mouth Rinse 4 times per day   pantoprazole  40 mg Oral Daily   revefenacin  175 mcg Nebulization Daily   Continuous Infusions:  sodium chloride 5 mL/hr at 10/15/22 0900    ceFAZolin (ANCEF) IV     lactated ringers       LOS:  7 days    Time  spent: 35 mins    Keiston Manley, MD [facility]   If 7PM-7AM, please contact night-coverage

## 2022-10-16 NOTE — Progress Notes (Addendum)
eLink Physician-Brief Progress Note Patient Name: Ryan [name] DOB: 11/24/1960 MRN: [id number]   Date of Service  10/16/2022  HPI/Events of Note  62 year old male with end-stage disease on hemodialysis, was transferred to ICU with acute hypoxic respiratory failure in the setting of volume overload leading to pulmonary edema, paroxysmal A-fib with RVR   Tonight, the patient became hypotensive-asymptomatic.  Received Lopressor at 2130 and Robaxin earlier in the day.  Is net -10 L for his stay at 500 cc negative yesterday.  eICU Interventions  Orthostatic vitals this morning  250 cc LR bolus  DC Precedex orders  Olanzapine p.o. once for severe anxiety.     Intervention Category Intermediate Interventions: Hypotension - evaluation and management  Tahj Lindseth 10/16/2022, 4:26 AM

## 2022-10-16 NOTE — Plan of Care (Signed)

## 2022-10-16 NOTE — Progress Notes (Signed)
9am: BP 88/55, paged MD midodrine resumed.   12:55 BP 83/54, patient's HR 140's when standing at the bedside.  MD came to the bedside. Metoprolol held, no new orders at the time.  16:15 BP 89/61. Scheduled midodrine given.

## 2022-10-16 NOTE — Progress Notes (Signed)
DISCHARGE NOTE FROM REHAB Patient transferred to acute for medical issues.  Min assist level 14 days length of stay on rehab will need to return once medically stable to complete rehab program Active prior to admission Unable to answer the mental health questions due to transferred while in HD. No transportation issues. No home health or DME arranged due to not completed rehab program and will need to return. Family and insurance made aware of transfer to acute.

## 2022-10-16 NOTE — Progress Notes (Signed)
0350H - SBP 60-59mmHg on multiple sites, HR 90's bpm, NSR with PAC  on monitor. Asymptomatic. Informed eLink Physician via call.  Elevated lower extremities. Patient requested to sit down at the edge of the bed. BP improved. Orthostatics done (see result). Patient was anxious to receive 250 cc fluid as ordered.Refused LR bolus. Education given. Verbalized understanding.

## 2022-10-16 NOTE — Care Management Important Message (Signed)
Important Message  Patient Details  Name: Ryan [name] MRN: [id number] Date of Birth: [birth year]   Medicare Important Message Given:  Yes     [name] 10/16/2022, 8:38 AM

## 2022-10-16 NOTE — Progress Notes (Signed)
Subjective:  seen /examined in room , Moved from ICU to 6 E, Off CRRT, no cos , asymp  with sbp 89/ , noted  Midodrine 10mg  tid   restarted today /  Objective Vital signs in last 24 hours: Vitals:   10/16/22 0835 10/16/22 0946 10/16/22 1023 10/16/22 1107  BP:  104/84 (!) 83/58 113/75  Pulse:  (!) 101 99   Resp:      Temp:      TempSrc:      SpO2: 100% 97% 99%   Weight:      Height:       Weight change: 1.018 kg  Physical Exam: General: alert  adult male , NAD Heart: RRR , 1/6 sem , no rg  Lungs: CTA bilat  nonlabored breathing , 97 % o2 sat  [place]  O2  Abdomen: NABS, , soft, NT, ND Extremities: No LE Edema, trace BUE Dialysis Access: TDC    OP HD:  [facility] T[facility]  3h  85.7kg     450/A1.5x    2K/2Ca   TDC   Heparin 3000 -No ESA -Calcitriol 1.5 MCG PO three times per week   Assessment/Plan: 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 ID RX  Blood Cx 6/16 MSSA, negative Cx on 6/17, 6/19. TDC removed 6/18 and had 72 hour line holiday. TTE was concerning for endocarditis. TEE cancelled d/t high risk factors. LS MRI with possible L5-S1 discitis. Will be getting IV Cefazolin 2g q HD x 6 weeks (until 11/04/22). Volume overload - pt had iHD last week tues, thursday, Friday and Sat (extra HD friday for [place]). Best UF last week was 2.5 L. Attempted HD on Sunday but pt didn't tolerate and was moved to ICU and started on bipap and CRRT.  Moved to ICU and started on CRRT. Had very good luck w/ net neg 4 L overnight and now pt is cramping and BP's dropped, signalling euvolemia (or hypovolemia). Will dc CRRT and resume iHD now that volume overload episode has resolved. Wt's are down significantly and this is likely due to lean body wt loss. Will get standing wt when he can stand.  Deconditioning /weakness: was in CIR, was transferred back to 6E due to  medical complications ESRD: Usual TTS schedule. Did CRRT 7.21 -  7/24. Dc CRRT now, next [facility] Friday 7/26   Access:  TDC pulled 6/18 for line holiday, then new LIJ TDC placed 7/01 per IR. For perm access: s/p L AVF ligation in 2019. Last seen by Dr. Edilia Bo 01/2022 - limited mobility in R arm so planning for L arm graft but had not scheduled surgery yet. Can revisit as an outpatient once bacteremia resolved.  Anemia - Hgb 9.5. +FOBT, GI -recommended PPI. Hold heparin with HD.plan  per primary service. Aranesp q Thursday, increased next dose to .   Hyponatremia:  NA 130 managing with HD and UF. Improving.  Hypotension-  on midodrine 10 mg tid .  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 Not on phos binder currently.    Nutrition - Renal diet, Alb low - continue supplements    Lenny Pastel, PA-C [facility] Beeper [phone] 10/16/2022,12:37 PM  LOS: 7 days   Labs: Basic Metabolic Panel: Recent Labs  Lab 10/14/22 1557 10/15/22 0556 10/16/22 0008  NA 132* 132* 130*  K 4.0 4.8 4.0  CL 96* 99 95*  CO2 23 21* 22  GLUCOSE 123* 112* 125*  BUN 11 17 34*  CREATININE 2.18* 2.17* 3.53*  CALCIUM 8.7* 8.8* 8.8*  PHOS 1.6* 2.0* 2.4*   Liver Function Tests: Recent Labs  Lab 10/10/22 1300 10/10/22 1829 10/14/22 0402 10/14/22 1557 10/15/22 0556  AST 41  --   --   --   --   ALT 7  --   --   --   --   ALKPHOS 107  --   --   --   --   BILITOT 0.7  --   --   --   --   PROT 7.6  --   --   --   --   ALBUMIN 2.0*   < > 2.4* 2.5* 2.4*   < > = values in this interval not displayed.   No results for input(s): "LIPASE", "AMYLASE" in the last 168 hours. No results for input(s): "AMMONIA" in the last 168 hours. CBC: Recent Labs  Lab 10/12/22 1330 10/13/22 0108 10/14/22 0402 10/15/22 0556 10/16/22 0008  WBC 16.7* 14.6* 12.0* 13.4* 14.4*  HGB 9.1* 8.2* 8.4* 9.5* 9.6*  HCT 30.2* 27.5* 28.2* 31.1* 32.4*  MCV 84.6 85.4 85.5 87.6 85.3  PLT 412* 289 286 243 245   Cardiac Enzymes: No results for input(s): "CKTOTAL", "CKMB", "CKMBINDEX", "TROPONINI" in the last 168 hours. CBG: Recent Labs  Lab  10/14/22 1613  GLUCAP 131*    Studies/Results: No results found. Medications:  sodium chloride 5 mL/hr at 10/15/22 0900    ceFAZolin (ANCEF) IV     lactated ringers      (feeding supplement) PROSource Plus  30 mL Oral TID WC   arformoterol  15 mcg Nebulization BID   calcitRIOL  0.5 mcg Oral Q T,Th,Sat-1800   Chlorhexidine Gluconate Cloth  6 each Topical Q0600   Darbepoetin Alfa  150 mcg Subcutaneous Q Thu-1800   feeding supplement (NEPRO CARB STEADY)  237 mL Oral TID BM   heparin  5,000 Units Subcutaneous Q8H   methocarbamol  250 mg Oral BID   metoprolol tartrate  50 mg Oral Q8H   midodrine  10 mg Oral BID WC   multivitamin  1 tablet Oral QHS   mouth rinse  15 mL Mouth Rinse 4 times per day   pantoprazole  40 mg Oral Daily   revefenacin  175 mcg Nebulization Daily

## 2022-10-16 NOTE — Progress Notes (Signed)
   10/16/22 2038  BiPAP/CPAP/SIPAP  Reason BIPAP/CPAP not in use Non-compliant

## 2022-10-17 LAB — BASIC METABOLIC PANEL
Anion gap: 16 — ABNORMAL HIGH (ref 5–15)
BUN: 61 mg/dL — ABNORMAL HIGH (ref 8–23)
CO2: 19 mmol/L — ABNORMAL LOW (ref 22–32)
Calcium: 8.9 mg/dL (ref 8.9–10.3)
Chloride: 93 mmol/L — ABNORMAL LOW (ref 98–111)
Creatinine, Ser: 5.82 mg/dL — ABNORMAL HIGH (ref 0.61–1.24)
GFR, Estimated: 10 mL/min — ABNORMAL LOW (ref >60)
Glucose, Bld: 107 mg/dL — ABNORMAL HIGH (ref 70–99)
Potassium: 4.7 mmol/L (ref 3.5–5.1)
Sodium: 128 mmol/L — ABNORMAL LOW (ref 135–145)

## 2022-10-17 MED ORDER — HEPARIN SODIUM (PORCINE) 1000 UNIT/ML IJ SOLN
INTRAMUSCULAR | Status: AC
  Filled 2022-10-17: qty 4

## 2022-10-17 MED ORDER — HEPARIN SODIUM (PORCINE) 1000 UNIT/ML IJ SOLN
3800.0000 [IU] | Freq: Once | INTRAMUSCULAR | Status: AC
  Administered 2022-10-17: 3800 [IU]

## 2022-10-17 MED ORDER — MIDODRINE HCL 5 MG PO TABS
10.0000 mg | ORAL_TABLET | Freq: Three times a day (TID) | ORAL | Status: DC
  Administered 2022-10-17 – 2022-10-23 (×20): 10 mg via ORAL
  Filled 2022-10-17 (×23): qty 2

## 2022-10-17 MED ORDER — METOPROLOL TARTRATE 25 MG PO TABS
25.0000 mg | ORAL_TABLET | Freq: Three times a day (TID) | ORAL | Status: DC
  Administered 2022-10-19 – 2022-10-29 (×18): 25 mg via ORAL
  Filled 2022-10-17 (×22): qty 1

## 2022-10-17 MED ORDER — ALBUMIN HUMAN 25 % IV SOLN
12.5000 g | Freq: Once | INTRAVENOUS | Status: AC
  Administered 2022-10-17: 25 g via INTRAVENOUS

## 2022-10-17 MED ORDER — METOPROLOL TARTRATE 50 MG PO TABS: 50.0000 mg | ORAL_TABLET | Freq: Three times a day (TID) | ORAL | Status: DC

## 2022-10-17 MED ORDER — METHOCARBAMOL 500 MG PO TABS: 250.0000 mg | ORAL_TABLET | Freq: Every evening | ORAL | Status: DC | PRN

## 2022-10-17 MED ORDER — ALBUMIN HUMAN 25 % IV SOLN
INTRAVENOUS | Status: AC
  Administered 2022-10-17: 25 g
  Filled 2022-10-17: qty 200

## 2022-10-17 NOTE — Plan of Care (Signed)

## 2022-10-17 NOTE — Progress Notes (Signed)
   10/17/22 1319  Vitals  Temp 98.1 F (36.7 C)  Pulse Rate (!) 115  Resp 20  BP (!) 81/60  SpO2 100 %  O2 Device Nasal Cannula  Oxygen Therapy  O2 Flow Rate (L/min) 5 L/min  Patient Activity (if Appropriate) In bed  Pulse Oximetry Type Continuous  Oximetry Probe Site Changed No  Post Treatment  Dialyzer Clearance Lightly streaked  Duration of HD Treatment -hour(s) 3.45 hour(s)  Hemodialysis Intake (mL) 900 mL  Liters Processed 82.7  Fluid Removed (mL) 0 mL  Tolerated HD Treatment No (Comment)  Post-Hemodialysis Comments katie PA aware of patient status   Received patient in bed to unit.  Alert and oriented.  Informed consent signed and in chart.   TX duration:3.45  Continued heart rate and blood pressure issues---Katy PA aware of his status Transported back to the room  Alert, without acute distress.  Hand-off given to patient's nurse.   Access used: LDLC Access issues: no complications  Total UF removed: positive 900cc---Katy PA is aware Medication(s) given: Albumin 25% x 2 given---128ml each   [facility]

## 2022-10-17 NOTE — Progress Notes (Addendum)
[facility] Progress Note   Subjective:   Seen on HD - 1L UFG planned. Quite hypotensive despite 10mg  mido this AM - will give 25g IV albumin. Weight is up 11lb in 2 days? Na drifting down so seems correct. Denies dyspnea or CP, but using O2. No LE edema. He is fairly asymptomatic today - feels ok overall.   Objective Vitals:   10/17/22 0930 10/17/22 1000 10/17/22 1030 10/17/22 1045  BP: (!) 85/71 (!) 93/59 92/62 (!) 86/66  Pulse: (!) 104 (!) 117 (!) 115 (!) 106  Resp: (!) 27 (!) 29 (!) 25 (!) 21  Temp:      TempSrc:      SpO2: 100% 100%  100%  Weight:      Height:       Physical Exam General: Chronically ill appearing man, NAD. Nasal O2 in place. Heart: Irregularly irregular, no murmur Lungs: CTA anteriorly Abdomen: soft Extremities: no LE edema Dialysis Access: [facility]  Additional Objective Labs: Basic Metabolic Panel: Recent Labs  Lab 10/14/22 1557 10/15/22 0556 10/16/22 0008 10/17/22 0717  NA 132* 132* 130* 128*  K 4.0 4.8 4.0 4.7  CL 96* 99 95* 93*  CO2 23 21* 22 19*  GLUCOSE 123* 112* 125* 107*  BUN 11 17 34* 61*  CREATININE 2.18* 2.17* 3.53* 5.82*  CALCIUM 8.7* 8.8* 8.8* 8.9  PHOS 1.6* 2.0* 2.4*  --    Liver Function Tests: Recent Labs  Lab 10/10/22 1300 10/10/22 1829 10/14/22 0402 10/14/22 1557 10/15/22 0556  AST 41  --   --   --   --   ALT 7  --   --   --   --   ALKPHOS 107  --   --   --   --   BILITOT 0.7  --   --   --   --   PROT 7.6  --   --   --   --   ALBUMIN 2.0*   < > 2.4* 2.5* 2.4*   < > = values in this interval not displayed.   CBC: Recent Labs  Lab 10/13/22 0108 10/14/22 0402 10/15/22 0556 10/16/22 0008 10/17/22 0156  WBC 14.6* 12.0* 13.4* 14.4* 15.4*  HGB 8.2* 8.4* 9.5* 9.6* 9.4*  HCT 27.5* 28.2* 31.1* 32.4* 31.7*  MCV 85.4 85.5 87.6 85.3 85.7  PLT 289 286 243 245 252   Blood Culture    Component Value Date/Time   SDES BLOOD BLOOD RIGHT ARM 10/05/2022 2032   SPECREQUEST  10/05/2022 2032    BOTTLES DRAWN  AEROBIC AND ANAEROBIC Blood Culture adequate volume   CULT  10/05/2022 2032    NO GROWTH 5 DAYS Performed at [facility], [address], [place], [place] [address]    REPTSTATUS 10/10/2022 FINAL 10/05/2022 2032   Medications:  sodium chloride Stopped (10/16/22 1900)    ceFAZolin (ANCEF) IV Stopped (10/16/22 1930)   lactated ringers      (feeding supplement) PROSource Plus  30 mL Oral TID WC   arformoterol  15 mcg Nebulization BID   calcitRIOL  0.5 mcg Oral Q T,Th,Sat-1800   Chlorhexidine Gluconate Cloth  6 each Topical Q0600   Darbepoetin Alfa  150 mcg Subcutaneous Q Thu-1800   feeding supplement (NEPRO CARB STEADY)  237 mL Oral TID BM   heparin  5,000 Units Subcutaneous Q8H   heparin sodium (porcine)       methocarbamol  250 mg Oral BID   metoprolol tartrate  50 mg Oral  Q8H   midodrine  10 mg Oral TID WC   multivitamin  1 tablet Oral QHS   mouth rinse  15 mL Mouth Rinse 4 times per day   pantoprazole  40 mg Oral Daily   revefenacin  175 mcg Nebulization Daily    Dialysis Orders: TTS at [facility] 3:45hr, 450/A1.5, EDW 85.7kg, 2K/2Ca bath, TDC, heparin 3000 unit bolus - no ESA - Calcitriol 1.74mcg PO q HD  Assessment/Plan: MSSA bacteremia/endocarditis/L5-S1 discitis: Blood Cx 6/16 MSSA, negative Cx on 6/17, 6/19. S/p TDC line holiday. TTE was concerning for endocarditis. TEE cancelled d/t high risk factors. LS MRI with possible L5-S1 discitis. On IV Cefazolin 2g q HD x 6 weeks (until 11/04/22). BP/Volume overload: Hypotension preventing adequate UF. Required ICU transfer for CRRT 7/21 - 10/15/22, able to get down to ~ 74kg range. TODAY, per weight and labs (hypoNa) appears that fluid is building right back up. Hypotensive on HD despite midodrine - giving 25g albumin now. Midodrine increased to 10mg  TID. He is on a decent metoprolol dose for A-fib -> likely affecting his BP -> needs to be reduced of at minimum held prior to HD. Will d/w AM muscle relaxant. We discussed reducing fluid  intake - adding fluid restriction to diet. Deconditioning /weakness: was in CIR, was transferred back to 6E due to  medical complications ESRD: See above. Usual TTS schedule, then CRRT 7/21 - 7/24, back to [facility] today and tomorrow. Access: TDC pulled 6/18 for line holiday, then new LIJ TDC placed 7/01 per IR. For perm access: s/p L AVF ligation in 2019. Last seen by Dr. Edilia Bo 01/2022 - limited mobility in R arm so planning for L arm graft but had not scheduled surgery yet. Can revisit as an outpatient once bacteremia resolved.  Anemia (ESRD + ABLA, + FOBT): Hgb 9.4 - stable for now. Continue Aranesp q Thursday.   CKD-MBD: CorrCa ok, Phos low side - no binders. Nutrition - Renal diet, Alb low - continue supplements    Ozzie Hoyle, PA-C 10/17/2022, 11:27 AM  [facility]

## 2022-10-17 NOTE — Progress Notes (Signed)
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  Patient initially presented on 08/28/2022 with low back pain, found to have evidence of L5-S1 discitis and osteomyelitis, MSSA bacteremia with mitral valve endocarditis.  CT of the chest showed septic emboli with a new airspace opacities in the left upper lobe.  MRI of the brain noting scattered subcentimeter infarcts involving the bilateral cerebral hemisphere and right cerebellum.  CT surgery has been consulted, recommended conservative management as patient already started responding to antibiotics.  Patient was recommended to continue with cefazolin until 11/22/2022 per ID recommendations.  Patient also had small bowel obstruction during that hospital stay, was discharged to rehab on 09/25/2022.  Patient started to experience cough on 10/02/2022, diagnosed with a pneumonia/aspiration pneumonia, was treated with 5 days of IV Zosyn.  Patient was noted to be fluid overloaded, being managed at the hemodialysis.  Patient also had dysphagia, drop in hemoglobin to 6.1 with a stool occult being positive.  Patient was seen by GI, esophagram performed which was negative for any acute pathology.  On 10/09/2022 developed atrial fibrillation with rapid ventricular rate, considering this patient is transferred to [facility].   EKG showed first-degree AV block, sinus tachycardia.  Patient was found to be in any respiratory distress due to combination of, pneumonia.  Patient was started on Zosyn, transition to cefazolin.  Patient developed respiratory failure, transferred to ICU, underwent CRRT.  Patient lost 17 pounds since admission.  Acute hypoxic respiratory failure -Secondary to fluid overload, pneumonia -Continue with cefazolin until 11/04/2022 -Underwent hemodialysis continues to have improvement.  Pneumonia secondary to septic emboli due to endocarditis -Continue the cefazolin -Continue the DuoNebs, Solu-Medrol.  Chronic hypotension -Patient is on Toprol-XL 50 mg every 8 hours, decreasing dose and follow-up -Continue with midodrine 10 mg 3 times a day  ESRD on HD -Continue with the hemodialysis -Nephrology is following with the patient.  Paroxysmal atrial fibrillation with rapid ventricular rate -Patient required Cardizem drip -Currently rate is controlled with the metoprolol every 8 hours -Closely follow-up with the potassium greater than 4, magnesium greater than 2.  Dysphagia -Evaluated by speech therapy, cleared for dysphagia diet with thin liquids.  Anemia of chronic disease, chronic kidney disease -Closely monitor CBC -Transfuse for hemoglobin less than 7.  Infectious endocarditis of mitral valve -Patient had extensive septic embolization to the lungs and brain -MSSA bacteremia  L5-S1 discitis with osteomyelitis -Continue with the current antibiotic regimen -Continue with the cefazolin until 11/04/2022   DVT prophylaxis: Heparin sq Code Status:Full code Family Communication:No family Disposition Plan:    Status is: Inpatient Remains inpatient appropriate because: Continued inpatient workup.     Consultants:  Cardiology Nephrology  Procedures:  Antimicrobials: Anti-infectives (From admission, onward)    Start     Dose/Rate Route Frequency Ordered Stop   10/16/22 2000   ceFAZolin (ANCEF) IVPB 1  g/50 mL premix        1 g 100 mL/hr over 30 Minutes Intravenous Every 24 hours 10/15/22 1355     10/12/22 1800  ceFAZolin (ANCEF) IVPB 2g/100 mL premix  Status:  Discontinued        2 g 200 mL/hr over 30 Minutes Intravenous Every 12 hours 10/12/22 1557 10/15/22 1355   10/09/22 1800  ceFAZolin (ANCEF) IVPB 2g/100 mL premix  Status:  Discontinued        2 g 200 mL/hr over 30 Minutes Intravenous Every T-Th-Sa (1800) 10/09/22 1619 10/12/22 1557       Subjective: Continues to have hypotension.  Reportedly could remove 1 L of fluid through hemodialysis.  Continues to have tachycardia.  Denies any shortness of breath.  Objective: Vitals:   10/17/22 1200 10/17/22 1230 10/17/22 1319 10/17/22 1408  BP: (!) 99/57 90/62 (!) 81/60 114/71  Pulse: 98 82 (!) 115 (!) 51  Resp: (!) 32 (!) 29 20 20   Temp:   98.1 F (36.7 C) 98.5 F (36.9 C)  TempSrc:    Oral  SpO2: 100% 100% 100% 100%  Weight:      Height:        Intake/Output Summary (Last 24 hours) at 10/17/2022 1455 Last data filed at 10/17/2022 1319 Gross per 24 hour  Intake 650 ml  Output 0 ml  Net 650 ml   Filed Weights   10/15/22 0500 10/15/22 2100 10/17/22 0815  Weight: 73.1 kg 74.1 kg 79.2 kg    Examination:  General exam: Appears comfortable, deconditioned, not in any acute distress. Respiratory system: Clear to auscultation. Respiratory effort normal. RR 12, Left port left upper chest. Cardiovascular system: S1 & S2 heard, RRR. No JVD, murmurs, rubs, gallops or clicks.  Gastrointestinal system: Abdomen is nondistended, soft and nontender. Normal bowel sounds heard. Central nervous system: Alert and oriented x3. No focal neurological deficits. Extremities: No edema, no cyanosis, no clubbing Skin: No rashes, lesions or ulcers Psychiatry: Judgement and insight appear normal. Mood & affect appropriate.     Data Reviewed: I have personally reviewed following labs and imaging studies  CBC: Recent  Labs  Lab 10/13/22 0108 10/14/22 0402 10/15/22 0556 10/16/22 0008 10/17/22 0156  WBC 14.6* 12.0* 13.4* 14.4* 15.4*  HGB 8.2* 8.4* 9.5* 9.6* 9.4*  HCT 27.5* 28.2* 31.1* 32.4* 31.7*  MCV 85.4 85.5 87.6 85.3 85.7  PLT 289 286 243 245 252   Basic Metabolic Panel: Recent Labs  Lab 10/12/22 0251 10/12/22 1330 10/13/22 0108 10/13/22 1613 10/14/22 0402 10/14/22 1557 10/15/22 0556 10/16/22 0008 10/17/22 0717  NA 127*   < > 133* 132* 133* 132* 132* 130* 128*  K 3.3*   < > 4.1 4.1 4.2 4.0 4.8 4.0 4.7  CL 93*   < > 94* 100 98 96* 99 95* 93*  CO2 23   < > 23 24 23 23  21* 22 19*  GLUCOSE 124*   < > 108* 111* 115* 123* 112* 125* 107*  BUN 22   < > 19 13 13 11 17  34* 61*  CREATININE 4.06*   < > 3.60* 2.76* 2.29* 2.18* 2.17* 3.53* 5.82*  CALCIUM 8.5*   < > 8.5* 8.3* 8.5* 8.7* 8.8* 8.8* 8.9  MG 1.6*  --  2.2  --  2.4  --  2.7* 2.5*  --   PHOS 2.4*   < > 2.7 2.2* 2.1* 1.6* 2.0* 2.4*  --    < > = values in this interval not displayed.  GFR: Estimated Creatinine Clearance: 13.3 mL/min (A) (by C-G formula based on SCr of 5.82 mg/dL (H)). Liver Function Tests: Recent Labs  Lab 10/13/22 0108 10/13/22 1613 10/14/22 0402 10/14/22 1557 10/15/22 0556  ALBUMIN 2.3* 2.3* 2.4* 2.5* 2.4*   No results for input(s): "LIPASE", "AMYLASE" in the last 168 hours. No results for input(s): "AMMONIA" in the last 168 hours. Coagulation Profile: Recent Labs  Lab 10/10/22 1829  INR 1.4*   Cardiac Enzymes: No results for input(s): "CKTOTAL", "CKMB", "CKMBINDEX", "TROPONINI" in the last 168 hours. BNP (last 3 results) No results for input(s): "PROBNP" in the last 8760 hours. HbA1C: No results for input(s): "HGBA1C" in the last 72 hours. CBG: Recent Labs  Lab 10/14/22 1613  GLUCAP 131*   Lipid Profile: No results for input(s): "CHOL", "HDL", "LDLCALC", "TRIG", "CHOLHDL", "LDLDIRECT" in the last 72 hours. Thyroid Function Tests: No results for input(s): "TSH", "T4TOTAL", "FREET4", "T3FREE",  "THYROIDAB" in the last 72 hours.  Anemia Panel: No results for input(s): "VITAMINB12", "FOLATE", "FERRITIN", "TIBC", "IRON", "RETICCTPCT" in the last 72 hours. Sepsis Labs: No results for input(s): "PROCALCITON", "LATICACIDVEN" in the last 168 hours.   Recent Results (from the past 240 hour(s))  MRSA Next Gen by PCR, Nasal     Status: None   Collection Time: 10/12/22  3:03 PM   Specimen: Nasal Mucosa; Nasal Swab  Result Value Ref Range Status   MRSA by PCR Next Gen NOT DETECTED NOT DETECTED Final    Comment: (NOTE) The GeneXpert MRSA Assay (FDA approved for NASAL specimens only), is one component of a comprehensive MRSA colonization surveillance program. It is not intended to diagnose MRSA infection nor to guide or monitor treatment for MRSA infections. Test performance is not FDA approved in patients less than 18 years old. Performed at [facility], [address], [place], [place] [address]     Radiology Studies: No results found.   Scheduled Meds:  (feeding supplement) PROSource Plus  30 mL Oral TID WC   arformoterol  15 mcg Nebulization BID   calcitRIOL  0.5 mcg Oral Q T,Th,Sat-1800   Chlorhexidine Gluconate Cloth  6 each Topical Q0600   Darbepoetin Alfa  150 mcg Subcutaneous Q Thu-1800   feeding supplement (NEPRO CARB STEADY)  237 mL Oral TID BM   heparin  5,000 Units Subcutaneous Q8H   metoprolol tartrate  50 mg Oral Q8H   midodrine  10 mg Oral TID WC   multivitamin  1 tablet Oral QHS   mouth rinse  15 mL Mouth Rinse 4 times per day   pantoprazole  40 mg Oral Daily   revefenacin  175 mcg Nebulization Daily   Continuous Infusions:  sodium chloride Stopped (10/16/22 1900)    ceFAZolin (ANCEF) IV Stopped (10/16/22 1930)   lactated ringers       LOS: 8 days    Time spent: 35 mins    Jaydeen Odor, MD [facility]   If 7PM-7AM, please contact night-coverage

## 2022-10-18 DIAGNOSIS — I4891 Unspecified atrial fibrillation: Secondary | ICD-10-CM | POA: Diagnosis not present

## 2022-10-18 LAB — BASIC METABOLIC PANEL
Anion gap: 19 — ABNORMAL HIGH (ref 5–15)
BUN: 44 mg/dL — ABNORMAL HIGH (ref 8–23)
CO2: 21 mmol/L — ABNORMAL LOW (ref 22–32)
Calcium: 9.2 mg/dL (ref 8.9–10.3)
Chloride: 92 mmol/L — ABNORMAL LOW (ref 98–111)
Creatinine, Ser: 4.78 mg/dL — ABNORMAL HIGH (ref 0.61–1.24)
GFR, Estimated: 13 mL/min — ABNORMAL LOW (ref >60)
Glucose, Bld: 119 mg/dL — ABNORMAL HIGH (ref 70–99)
Potassium: 4.6 mmol/L (ref 3.5–5.1)
Sodium: 132 mmol/L — ABNORMAL LOW (ref 135–145)

## 2022-10-18 MED ORDER — HEPARIN SODIUM (PORCINE) 1000 UNIT/ML IJ SOLN
INTRAMUSCULAR | Status: AC
  Administered 2022-10-18: 3800 [IU] via INTRAVENOUS_CENTRAL
  Filled 2022-10-18: qty 4

## 2022-10-18 NOTE — Progress Notes (Signed)
[facility] Progress Note   Subjective:  Seen in room - sitting up in recliner. Reports when sitting up he feels good, no palpitations or dyspnea. When laying, he feels awful with dyspnea. Disc this is c/w pulm edema. Didn't get any fluid off him with HD yesterday (RN turned off UF, gave bolus for intermittent HR to 140s, hypotension, etc) - trying again today. Metoprolol reduced to 25mg  TID yesterday and held this AM - did get midodrine 10mg  this morning as well. Asking if can get HD in recliner today - will see if we have one, if so - fine to try.  Objective Vitals:   10/17/22 2340 10/18/22 0340 10/18/22 0745 10/18/22 0805  BP: 98/64 94/62 112/73   Pulse: 100 99    Resp: 19 15 (!) 23   Temp: 98.9 F (37.2 C) 99.1 F (37.3 C) 98.2 F (36.8 C)   TempSrc: Axillary Axillary Axillary   SpO2: 98% 100%  98%  Weight:      Height:       Physical Exam General: Chronically ill appearing man, NAD. Nasal O2 in place. Sitting in chair. Heart: Irregularly irregular, no murmur Lungs: CTA anteriorly Abdomen: soft Extremities: no LE edema Dialysis Access: [facility]  Additional Objective Labs: Basic Metabolic Panel: Recent Labs  Lab 10/14/22 1557 10/15/22 0556 10/16/22 0008 10/17/22 0717  NA 132* 132* 130* 128*  K 4.0 4.8 4.0 4.7  CL 96* 99 95* 93*  CO2 23 21* 22 19*  GLUCOSE 123* 112* 125* 107*  BUN 11 17 34* 61*  CREATININE 2.18* 2.17* 3.53* 5.82*  CALCIUM 8.7* 8.8* 8.8* 8.9  PHOS 1.6* 2.0* 2.4*  --    Liver Function Tests: Recent Labs  Lab 10/14/22 0402 10/14/22 1557 10/15/22 0556  ALBUMIN 2.4* 2.5* 2.4*   CBC: Recent Labs  Lab 10/14/22 0402 10/15/22 0556 10/16/22 0008 10/17/22 0156 10/18/22 0143  WBC 12.0* 13.4* 14.4* 15.4* 12.5*  HGB 8.4* 9.5* 9.6* 9.4* 9.1*  HCT 28.2* 31.1* 32.4* 31.7* 29.7*  MCV 85.5 87.6 85.3 85.7 83.7  PLT 286 243 245 252 218   Medications:  sodium chloride Stopped (10/16/22 1900)    ceFAZolin (ANCEF) IV 1 g (10/17/22 2000)    lactated ringers      (feeding supplement) PROSource Plus  30 mL Oral TID WC   arformoterol  15 mcg Nebulization BID   calcitRIOL  0.5 mcg Oral Q T,Th,Sat-1800   Chlorhexidine Gluconate Cloth  6 each Topical Q0600   Darbepoetin Alfa  150 mcg Subcutaneous Q Thu-1800   feeding supplement (NEPRO CARB STEADY)  237 mL Oral TID BM   heparin  5,000 Units Subcutaneous Q8H   metoprolol tartrate  25 mg Oral Q8H   midodrine  10 mg Oral TID WC   multivitamin  1 tablet Oral QHS   mouth rinse  15 mL Mouth Rinse 4 times per day   pantoprazole  40 mg Oral Daily   revefenacin  175 mcg Nebulization Daily    Dialysis Orders: T[facility] at [facility] 3:45hr, 450/A1.5, EDW 85.7kg, 2K/2Ca bath, TDC, heparin 3000 unit bolus - no ESA - Calcitriol 1.6mcg PO q HD   Assessment/Plan: MSSA bacteremia/endocarditis/L5-S1 discitis: Blood Cx 6/16 MSSA, negative Cx on 6/17, 6/19. S/p TDC line holiday. TTE was concerning for endocarditis. TEE cancelled d/t high risk factors. LS MRI with possible L5-S1 discitis. On IV Cefazolin 2g q HD x 6 weeks (until 11/04/22). BP/Volume overload: Hypotension preventing adequate UF. Required ICU transfer for CRRT 7/21 - 10/15/22,  able to get down to ~ 74kg range. Weight back up to 79kg on 7/26 - dialyzed but unable to get any fluid off due to hypotension and intermittent tachycardia. No effect with IV albumin and midodrine. Metoprolol dose reduced from 50mg  TID -> 25mg  TID. Fluid restrictions disc. Trying HD again today. Deconditioning /weakness: was in CIR, was transferred back to 6E due to  medical complications ESRD: See above. Usual TTS schedule, then CRRT 7/21 - 7/24, back to iHD. Did poorly 7/26, trying again today. Access: TDC pulled 6/18 for line holiday, then new LIJ TDC placed 7/01 per IR. For perm access: s/p L AVF ligation in 2019. Last seen by Dr. Edilia Bo 01/2022 - limited mobility in R arm so planning for L arm graft but had not scheduled surgery yet. Can revisit as an outpatient once  bacteremia resolved.  Anemia (ESRD + ABLA, + FOBT): Hgb 9.4 - stable for now. Continue Aranesp q Thursday.   CKD-MBD: CorrCa ok, Phos low side - no binders. Nutrition - Renal diet, Alb low - continue supplements  Ozzie Hoyle, PA-C 10/18/2022, 9:25 AM  [employer]

## 2022-10-18 NOTE — Progress Notes (Signed)
Received patient in bed to unit.  Alert and oriented.  Informed consent signed and in chart.   TX duration:3.5  Patient tolerated well.  Transported back to the room  Alert, without acute distress.  Hand-off given to patient's nurse.   Access used: [facility] Access issues: no complications  Total UF removed: 2000 Medication(s) given: none   [facility]

## 2022-10-18 NOTE — Progress Notes (Signed)
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  Patient initially presented on 08/28/2022 with low back pain, found to have evidence of L5-S1 discitis and osteomyelitis, MSSA bacteremia with mitral valve endocarditis.  CT of the chest showed septic emboli with a new airspace opacities in the left upper lobe.  MRI of the brain noting scattered subcentimeter infarcts involving the bilateral cerebral hemisphere and right cerebellum.  CT surgery has been consulted, recommended conservative management as patient already started responding to antibiotics.  Patient was recommended to continue with cefazolin until 11/22/2022 per ID recommendations.  Patient also had small bowel obstruction during that hospital stay, was discharged to rehab on 09/25/2022.  Patient started to experience cough on 10/02/2022, diagnosed with a pneumonia/aspiration pneumonia, was treated with 5 days of IV Zosyn.  Patient was noted to be fluid overloaded, being managed at the hemodialysis.  Patient also had dysphagia, drop in hemoglobin to 6.1 with a stool occult being positive.  Patient was seen by GI, esophagram performed which was negative for any acute pathology.  On 10/09/2022 developed atrial fibrillation with rapid ventricular rate, considering this patient is transferred to [facility].   EKG showed first-degree AV block, sinus tachycardia.  Patient was found to be in any respiratory distress due to combination of, pneumonia.  Patient was started on Zosyn, transition to cefazolin.  Patient developed respiratory failure, transferred to ICU, underwent CRRT.  Patient lost 17 pounds since admission.  Acute hypoxic respiratory failure -Secondary to fluid overload, pneumonia -Continue with cefazolin until 11/04/2022 -Underwent hemodialysis continues to have improvement.  Pneumonia secondary to septic emboli due to endocarditis -Continue the cefazolin -Continue the DuoNebs, Solu-Medrol.  Chronic hypotension -Patient is on Toprol-XL 50 mg every 8 hours, decreasing dose and follow-up -Continue with midodrine 10 mg 3 times a day  ESRD on HD -Continue with the hemodialysis -Nephrology is following with the patient. -Unable to remove fluid yesterday due to hypotension, plan to have another session of hemodialysis today  Paroxysmal atrial fibrillation with rapid ventricular rate -Patient required Cardizem drip -Currently rate is controlled with the metoprolol every 8 hours -Closely follow-up with the potassium greater than 4, magnesium greater than 2.  Dysphagia -Evaluated by speech therapy, cleared for dysphagia diet with thin liquids.  Anemia of chronic disease, chronic kidney disease -Closely monitor CBC -Transfuse for hemoglobin less than 7.  Infectious endocarditis of mitral valve -Patient had extensive septic embolization to the lungs and brain -MSSA bacteremia  L5-S1 discitis with osteomyelitis -Continue with the current antibiotic regimen -Continue with the cefazolin until 11/04/2022   DVT prophylaxis: Heparin sq Code Status:Full code Family Communication:No family Disposition Plan:    Status is: Inpatient Remains inpatient appropriate because: Continued inpatient workup.     Consultants:  Cardiology Nephrology  Procedures:   Antimicrobials: Anti-infectives (From admission, onward)    Start  Dose/Rate Route Frequency Ordered Stop   10/16/22 2000  ceFAZolin (ANCEF) IVPB 1 g/50 mL premix        1 g 100 mL/hr over 30 Minutes Intravenous Every 24 hours 10/15/22 1355     10/12/22 1800  ceFAZolin (ANCEF) IVPB 2g/100 mL premix  Status:  Discontinued        2 g 200 mL/hr over 30 Minutes Intravenous Every 12 hours 10/12/22 1557 10/15/22 1355   10/09/22 1800  ceFAZolin (ANCEF) IVPB 2g/100 mL premix  Status:  Discontinued        2 g 200 mL/hr over 30 Minutes Intravenous Every T-Th-Sa (1800) 10/09/22 1619 10/12/22 1557       Subjective: Continues to have hypotension.  Reportedly could remove 1 L of fluid through hemodialysis.  Continues to have tachycardia.  Denies any shortness of breath.  Objective: Vitals:   10/18/22 1111 10/18/22 1114 10/18/22 1343 10/18/22 1350  BP: 97/72 105/61  108/68  Pulse:   98 66  Resp: (!) 22  (!) 22 19  Temp: 98.2 F (36.8 C)  (!) 96.8 F (36 C) 98.1 F (36.7 C)  TempSrc: Axillary     SpO2:   98% 100%  Weight:      Height:       No intake or output data in the 24 hours ending 10/18/22 1402  Filed Weights   10/15/22 0500 10/15/22 2100 10/17/22 0815  Weight: 73.1 kg 74.1 kg 79.2 kg    Examination:  General exam: Appears comfortable, deconditioned, not in any acute distress. Respiratory system: Clear to auscultation. Respiratory effort normal. RR 12, Left port left upper chest. Cardiovascular system: S1 & S2 heard, RRR. No JVD, murmurs, rubs, gallops or clicks.  Gastrointestinal system: Abdomen is nondistended, soft and nontender. Normal bowel sounds heard. Central nervous system: Alert and oriented x3. No focal neurological deficits. Extremities: No edema, no cyanosis, no clubbing Skin: No rashes, lesions or ulcers Psychiatry: Judgement and insight appear normal. Mood & affect appropriate.     Data Reviewed: I have personally reviewed following labs and imaging  studies  CBC: Recent Labs  Lab 10/14/22 0402 10/15/22 0556 10/16/22 0008 10/17/22 0156 10/18/22 0143  WBC 12.0* 13.4* 14.4* 15.4* 12.5*  HGB 8.4* 9.5* 9.6* 9.4* 9.1*  HCT 28.2* 31.1* 32.4* 31.7* 29.7*  MCV 85.5 87.6 85.3 85.7 83.7  PLT 286 243 245 252 218   Basic Metabolic Panel: Recent Labs  Lab 10/12/22 0251 10/12/22 1330 10/13/22 0108 10/13/22 1613 10/14/22 0402 10/14/22 1557 10/15/22 0556 10/16/22 0008 10/17/22 0717 10/18/22 1141  NA 127*   < > 133* 132* 133* 132* 132* 130* 128* 132*  K 3.3*   < > 4.1 4.1 4.2 4.0 4.8 4.0 4.7 4.6  CL 93*   < > 94* 100 98 96* 99 95* 93* 92*  CO2 23   < > 23 24 23 23  21* 22 19* 21*  GLUCOSE 124*   < > 108* 111* 115* 123* 112* 125* 107* 119*  BUN 22   < > 19 13 13 11 17  34* 61* 44*  CREATININE 4.06*   < > 3.60* 2.76* 2.29* 2.18* 2.17* 3.53* 5.82* 4.78*  CALCIUM 8.5*   < > 8.5* 8.3* 8.5* 8.7* 8.8* 8.8* 8.9 9.2  MG 1.6*  --  2.2  --  2.4  --  2.7* 2.5*  --   --   PHOS 2.4*   < > 2.7 2.2* 2.1* 1.6* 2.0* 2.4*  --   --    < > =  values in this interval not displayed.   GFR: Estimated Creatinine Clearance: 16.2 mL/min (A) (by C-G formula based on SCr of 4.78 mg/dL (H)). Liver Function Tests: Recent Labs  Lab 10/13/22 0108 10/13/22 1613 10/14/22 0402 10/14/22 1557 10/15/22 0556  ALBUMIN 2.3* 2.3* 2.4* 2.5* 2.4*   No results for input(s): "LIPASE", "AMYLASE" in the last 168 hours. No results for input(s): "AMMONIA" in the last 168 hours. Coagulation Profile: No results for input(s): "INR", "PROTIME" in the last 168 hours.  Cardiac Enzymes: No results for input(s): "CKTOTAL", "CKMB", "CKMBINDEX", "TROPONINI" in the last 168 hours. BNP (last 3 results) No results for input(s): "PROBNP" in the last 8760 hours. HbA1C: No results for input(s): "HGBA1C" in the last 72 hours. CBG: Recent Labs  Lab 10/14/22 1613  GLUCAP 131*   Lipid Profile: No results for input(s): "CHOL", "HDL", "LDLCALC", "TRIG", "CHOLHDL", "LDLDIRECT" in the  last 72 hours. Thyroid Function Tests: No results for input(s): "TSH", "T4TOTAL", "FREET4", "T3FREE", "THYROIDAB" in the last 72 hours.  Anemia Panel: No results for input(s): "VITAMINB12", "FOLATE", "FERRITIN", "TIBC", "IRON", "RETICCTPCT" in the last 72 hours. Sepsis Labs: No results for input(s): "PROCALCITON", "LATICACIDVEN" in the last 168 hours.   Recent Results (from the past 240 hour(s))  MRSA Next Gen by PCR, Nasal     Status: None   Collection Time: 10/12/22  3:03 PM   Specimen: Nasal Mucosa; Nasal Swab  Result Value Ref Range Status   MRSA by PCR Next Gen NOT DETECTED NOT DETECTED Final    Comment: (NOTE) The GeneXpert MRSA Assay (FDA approved for NASAL specimens only), is one component of a comprehensive MRSA colonization surveillance program. It is not intended to diagnose MRSA infection nor to guide or monitor treatment for MRSA infections. Test performance is not FDA approved in patients less than 57 years old. Performed at [facility], [address] [place], [place] [address]     Radiology Studies: No results found.   Scheduled Meds:  (feeding supplement) PROSource Plus  30 mL Oral TID WC   arformoterol  15 mcg Nebulization BID   calcitRIOL  0.5 mcg Oral Q T,Th,Sat-1800   Chlorhexidine Gluconate Cloth  6 each Topical Q0600   Darbepoetin Alfa  150 mcg Subcutaneous Q Thu-1800   feeding supplement (NEPRO CARB STEADY)  237 mL Oral TID BM   heparin  5,000 Units Subcutaneous Q8H   metoprolol tartrate  25 mg Oral Q8H   midodrine  10 mg Oral TID WC   multivitamin  1 tablet Oral QHS   mouth rinse  15 mL Mouth Rinse 4 times per day   pantoprazole  40 mg Oral Daily   revefenacin  175 mcg Nebulization Daily   Continuous Infusions:  sodium chloride Stopped (10/16/22 1900)    ceFAZolin (ANCEF) IV 1 g (10/17/22 2000)   lactated ringers       LOS: 9 days    Time spent: 35 mins    Brigido Mera, MD [facility]   If 7PM-7AM, please  contact night-coverage

## 2022-10-18 NOTE — Plan of Care (Signed)

## 2022-10-19 LAB — CBC
HCT: 30.5 % — ABNORMAL LOW (ref 39.0–52.0)
Hemoglobin: 9.2 g/dL — ABNORMAL LOW (ref 13.0–17.0)
MCH: 24.9 pg — ABNORMAL LOW (ref 26.0–34.0)
MCHC: 30.2 g/dL (ref 30.0–36.0)
MCV: 82.7 fL (ref 80.0–100.0)
Platelets: 227 10*3/uL (ref 150–400)
RBC: 3.69 MIL/uL — ABNORMAL LOW (ref 4.22–5.81)
RDW: 20.5 % — ABNORMAL HIGH (ref 11.5–15.5)
WBC: 11.6 10*3/uL — ABNORMAL HIGH (ref 4.0–10.5)
nRBC: 0.5 % — ABNORMAL HIGH (ref 0.0–0.2)

## 2022-10-19 NOTE — Progress Notes (Signed)
[facility] Progress Note   Subjective:  Seen in room - HD actually went much better yesterday - BP held and 2L removed. Still volume up. Says woke with telemetry alarming due to tachycardia, improved with sitting up in recliner. We discussed extra HD today and he is agreeable -> will be this evening.   Objective Vitals:   10/18/22 2358 10/19/22 0458 10/19/22 0759 10/19/22 0810  BP: (!) 92/58 94/69 107/81   Pulse: (!) 105 (!) 102    Resp: (!) 22 (!) 22    Temp: 98.4 F (36.9 C) 98.4 F (36.9 C) 98.4 F (36.9 C)   TempSrc: Axillary Axillary Axillary   SpO2: 95% 94%  98%  Weight:      Height:       Physical Exam General: Chronically ill appearing man, NAD. Nasal O2 in place. Sitting in chair. Heart: Irregularly irregular, no murmur Lungs: CTA in upper lobes, bibasilar rales present Abdomen: soft Extremities: no LE edema Dialysis Access: [facility]   Additional Objective Labs: Basic Metabolic Panel: Recent Labs  Lab 10/14/22 1557 10/15/22 0556 10/16/22 0008 10/17/22 0717 10/18/22 1141  NA 132* 132* 130* 128* 132*  K 4.0 4.8 4.0 4.7 4.6  CL 96* 99 95* 93* 92*  CO2 23 21* 22 19* 21*  GLUCOSE 123* 112* 125* 107* 119*  BUN 11 17 34* 61* 44*  CREATININE 2.18* 2.17* 3.53* 5.82* 4.78*  CALCIUM 8.7* 8.8* 8.8* 8.9 9.2  PHOS 1.6* 2.0* 2.4*  --   --    Liver Function Tests: Recent Labs  Lab 10/14/22 0402 10/14/22 1557 10/15/22 0556  ALBUMIN 2.4* 2.5* 2.4*   CBC: Recent Labs  Lab 10/15/22 0556 10/16/22 0008 10/17/22 0156 10/18/22 0143 10/19/22 0035  WBC 13.4* 14.4* 15.4* 12.5* 11.6*  HGB 9.5* 9.6* 9.4* 9.1* 9.2*  HCT 31.1* 32.4* 31.7* 29.7* 30.5*  MCV 87.6 85.3 85.7 83.7 82.7  PLT 243 245 252 218 227   Medications:  sodium chloride 10 mL/hr at 10/18/22 2141    ceFAZolin (ANCEF) IV 1 g (10/18/22 2142)   lactated ringers      (feeding supplement) PROSource Plus  30 mL Oral TID WC   arformoterol  15 mcg Nebulization BID   calcitRIOL  0.5 mcg Oral Q  T,Th,Sat-1800   Chlorhexidine Gluconate Cloth  6 each Topical Q0600   Darbepoetin Alfa  150 mcg Subcutaneous Q Thu-1800   feeding supplement (NEPRO CARB STEADY)  237 mL Oral TID BM   heparin  5,000 Units Subcutaneous Q8H   metoprolol tartrate  25 mg Oral Q8H   midodrine  10 mg Oral TID WC   multivitamin  1 tablet Oral QHS   mouth rinse  15 mL Mouth Rinse 4 times per day   pantoprazole  40 mg Oral Daily   revefenacin  175 mcg Nebulization Daily    Dialysis Orders: TTS at [facility] 3:45hr, 450/A1.5, EDW 85.7kg, 2K/2Ca bath, TDC, heparin 3000 unit bolus - no ESA - Calcitriol 1.70mcg PO q HD   Assessment/Plan: MSSA bacteremia/endocarditis/L5-S1 discitis: Blood Cx 6/16 MSSA, negative Cx on 6/17, 6/19. S/p TDC line holiday. TTE was concerning for endocarditis. TEE cancelled d/t high risk factors. LS MRI with possible L5-S1 discitis. On IV Cefazolin 2g q HD x 6 weeks (until 11/04/22). BP/Volume overload: Hypotension preventing adequate UF. Required ICU transfer for CRRT 7/21 - 10/15/22, able to get down to ~ 74kg range. Weight back up to 79kg on 7/26 - dialyzed but unable to get any fluid off due  to hypotension and intermittent tachycardia. No effect with IV albumin and midodrine. Metoprolol dose reduced from 50mg  TID -> 25mg  TID. Fluid restrictions disc. Did MUCH better with HD 7/27, trying again today - remains volume up. Deconditioning /weakness: was in CIR, was transferred back to 6E due to  medical complications ESRD: See above. Usual TTS schedule, then CRRT 7/21 - 7/24, back to iHD. Did poorly 7/26, better on 7/27 -> for another HD today for volume only. Access: TDC pulled 6/18 for line holiday, then new LIJ TDC placed 7/01 per IR. For perm access: s/p L AVF ligation in 2019. Last seen by Dr. Edilia Bo 01/2022 - limited mobility in R arm so planning for L arm graft but had not scheduled surgery yet. Can revisit as an outpatient once bacteremia resolved.  Anemia (ESRD + ABLA, + FOBT): Hgb 9.2 - stable for  now. Continue Aranesp q Thursday.   CKD-MBD: CorrCa rising, Phos low side - no binders. Hold VDRA. Nutrition - Renal diet, Alb low - continue supplements  Ozzie Hoyle, PA-C 10/19/2022, 8:59 AM  [employer]

## 2022-10-19 NOTE — Plan of Care (Signed)

## 2022-10-19 NOTE — Progress Notes (Signed)
Due to other emergent HD needs will not be able to dialzye him tonight, have re-ordered his HD for 1st shift Monday morning.   Ryan Moselle  MD  [facility] 10/19/2022, 9:06 PM

## 2022-10-19 NOTE — Progress Notes (Signed)
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  Patient initially presented on 08/28/2022 with low back pain, found to have evidence of L5-S1 discitis and osteomyelitis, MSSA bacteremia with mitral valve endocarditis.  CT of the chest showed septic emboli with a new airspace opacities in the left upper lobe.  MRI of the brain noting scattered subcentimeter infarcts involving the bilateral cerebral hemisphere and right cerebellum.  CT surgery has been consulted, recommended conservative management as patient already started responding to antibiotics.  Patient was recommended to continue with cefazolin until 11/22/2022 per ID recommendations.  Patient also had small bowel obstruction during that hospital stay, was discharged to rehab on 09/25/2022.  Patient started to experience cough on 10/02/2022, diagnosed with a pneumonia/aspiration pneumonia, was treated with 5 days of IV Zosyn.  Patient was noted to be fluid overloaded, being managed at the hemodialysis.  Patient also had dysphagia, drop in hemoglobin to 6.1 with a stool occult being positive.  Patient was seen by GI, esophagram performed which was negative for any acute pathology.  On 10/09/2022 developed atrial fibrillation with rapid ventricular rate, considering this patient is transferred to [facility]   EKG showed first-degree AV block, sinus tachycardia.  Patient was found to be in any respiratory distress due to combination of, pneumonia.  Patient was started on Zosyn, transition to cefazolin.  Patient developed respiratory failure, transferred to ICU, underwent CRRT.  Patient lost 17 pounds since admission.  Acute hypoxic respiratory failure -Secondary to fluid overload, pneumonia -Continue with cefazolin until 11/04/2022 -Underwent hemodialysis continues to have improvement.  Pneumonia secondary to septic emboli due to endocarditis -Continue the cefazolin -Continue the DuoNebs, Solu-Medrol.  Chronic hypotension -Patient is on Toprol-XL 50 mg every 8 hours, decreasing dose and follow-up -Continue with midodrine 10 mg 3 times a day  ESRD on HD -Continue with the hemodialysis -Nephrology is following with the patient. - removed 2 L of fluid on 07/27, plan to continue with the hemodialysis 7/28  Paroxysmal atrial fibrillation with rapid ventricular rate -Patient required Cardizem drip -Currently rate is controlled with the metoprolol every 8 hours -Closely follow-up with the potassium greater than 4, magnesium greater than 2.  Dysphagia -Evaluated by speech therapy, cleared for dysphagia diet with thin liquids.  Anemia of chronic disease, chronic kidney disease -Closely monitor CBC -Transfuse for hemoglobin less than 7.  Infectious endocarditis of mitral valve -Patient had extensive septic embolization to the lungs and brain -MSSA bacteremia  L5-S1 discitis with osteomyelitis -Continue with the current antibiotic regimen -Continue with the cefazolin until 11/04/2022   DVT prophylaxis: Heparin sq Code Status:Full code Family Communication:No family Disposition Plan:  discharge to SNF when cleared by nephrology   Status is: Inpatient Remains inpatient appropriate because:  pending clinical improvement with fluid overload, CHF     Consultants:   Cardiology Nephrology  Procedures:  Antimicrobials: Anti-infectives (From admission, onward)    Start     Dose/Rate Route Frequency Ordered Stop   10/16/22 2000  ceFAZolin (ANCEF) IVPB 1 g/50 mL premix        1 g 100 mL/hr over 30 Minutes Intravenous Every 24 hours 10/15/22 1355     10/12/22 1800  ceFAZolin (ANCEF) IVPB 2g/100 mL premix  Status:  Discontinued        2 g 200 mL/hr over 30 Minutes Intravenous Every 12 hours 10/12/22 1557 10/15/22 1355   10/09/22 1800  ceFAZolin (ANCEF) IVPB 2g/100 mL premix  Status:  Discontinued        2 g 200 mL/hr over 30 Minutes Intravenous Every T-Th-Sa (1800) 10/09/22 1619 10/12/22 1557       Subjective: Continues to have hypotension.  Reportedly could remove 1 L of fluid through hemodialysis.  Continues to have tachycardia.  Denies any shortness of breath.  Objective: Vitals:   10/19/22 1436 10/19/22 1655 10/19/22 1700 10/19/22 1751  BP: (!) 87/55 107/69  108/86  Pulse: (!) 108 100  93  Resp: 18 20 20 18   Temp:      TempSrc:      SpO2: 100% 97%  98%  Weight:      Height:       No intake or output data in the 24 hours ending 10/19/22 1827  Filed Weights   10/15/22 0500 10/15/22 2100 10/17/22 0815  Weight: 73.1 kg 74.1 kg 79.2 kg    Examination:  General exam: Appears comfortable, deconditioned, not in any acute distress. Respiratory system: Clear to auscultation. Respiratory effort normal. RR 12, Left port left upper chest. Cardiovascular system: S1 & S2 heard, RRR. No JVD, murmurs, rubs, gallops or clicks.  Gastrointestinal system: Abdomen is nondistended, soft and nontender. Normal bowel sounds heard. Central nervous system: Alert and oriented x3. No focal neurological deficits. Extremities: No edema, no cyanosis, no clubbing Skin: No rashes, lesions or ulcers Psychiatry: Judgement and insight appear normal. Mood & affect appropriate.     Data Reviewed: I have personally reviewed following labs and imaging  studies  CBC: Recent Labs  Lab 10/15/22 0556 10/16/22 0008 10/17/22 0156 10/18/22 0143 10/19/22 0035  WBC 13.4* 14.4* 15.4* 12.5* 11.6*  HGB 9.5* 9.6* 9.4* 9.1* 9.2*  HCT 31.1* 32.4* 31.7* 29.7* 30.5*  MCV 87.6 85.3 85.7 83.7 82.7  PLT 243 245 252 218 227   Basic Metabolic Panel: Recent Labs  Lab 10/13/22 0108 10/13/22 1613 10/14/22 0402 10/14/22 1557 10/15/22 0556 10/16/22 0008 10/17/22 0717 10/18/22 1141  NA 133* 132* 133* 132* 132* 130* 128* 132*  K 4.1 4.1 4.2 4.0 4.8 4.0 4.7 4.6  CL 94* 100 98 96* 99 95* 93* 92*  CO2 23 24 23 23  21* 22 19* 21*  GLUCOSE 108* 111* 115* 123* 112* 125* 107* 119*  BUN 19 13 13 11 17  34* 61* 44*  CREATININE 3.60* 2.76* 2.29* 2.18* 2.17* 3.53* 5.82* 4.78*  CALCIUM 8.5* 8.3* 8.5* 8.7* 8.8* 8.8* 8.9 9.2  MG 2.2  --  2.4  --  2.7* 2.5*  --   --   PHOS 2.7 2.2* 2.1* 1.6* 2.0* 2.4*  --   --    GFR: Estimated Creatinine Clearance: 16.2 mL/min (A) (by C-G formula based on SCr of 4.78 mg/dL (H)). Liver Function Tests: Recent Labs  Lab 10/13/22 0108 10/13/22 1613 10/14/22 0402 10/14/22 1557 10/15/22 0556  ALBUMIN 2.3* 2.3* 2.4* 2.5* 2.4*   No results for input(s): "LIPASE", "AMYLASE"  in the last 168 hours. No results for input(s): "AMMONIA" in the last 168 hours. Coagulation Profile: No results for input(s): "INR", "PROTIME" in the last 168 hours.  Cardiac Enzymes: No results for input(s): "CKTOTAL", "CKMB", "CKMBINDEX", "TROPONINI" in the last 168 hours. BNP (last 3 results) No results for input(s): "PROBNP" in the last 8760 hours. HbA1C: No results for input(s): "HGBA1C" in the last 72 hours. CBG: Recent Labs  Lab 10/14/22 1613  GLUCAP 131*   Lipid Profile: No results for input(s): "CHOL", "HDL", "LDLCALC", "TRIG", "CHOLHDL", "LDLDIRECT" in the last 72 hours. Thyroid Function Tests: No results for input(s): "TSH", "T4TOTAL", "FREET4", "T3FREE", "THYROIDAB" in the last 72 hours.  Anemia Panel: No results for input(s):  "VITAMINB12", "FOLATE", "FERRITIN", "TIBC", "IRON", "RETICCTPCT" in the last 72 hours. Sepsis Labs: No results for input(s): "PROCALCITON", "LATICACIDVEN" in the last 168 hours.   Recent Results (from the past 240 hour(s))  MRSA Next Gen by PCR, Nasal     Status: None   Collection Time: 10/12/22  3:03 PM   Specimen: Nasal Mucosa; Nasal Swab  Result Value Ref Range Status   MRSA by PCR Next Gen NOT DETECTED NOT DETECTED Final    Comment: (NOTE) The GeneXpert MRSA Assay (FDA approved for NASAL specimens only), is one component of a comprehensive MRSA colonization surveillance program. It is not intended to diagnose MRSA infection nor to guide or monitor treatment for MRSA infections. Test performance is not FDA approved in patients less than 68 years old. Performed at [facility], [address] [place], [place] [address]     Radiology Studies: No results found.   Scheduled Meds:  (feeding supplement) PROSource Plus  30 mL Oral TID WC   arformoterol  15 mcg Nebulization BID   Chlorhexidine Gluconate Cloth  6 each Topical Q0600   Darbepoetin Alfa  150 mcg Subcutaneous Q Thu-1800   feeding supplement (NEPRO CARB STEADY)  237 mL Oral TID BM   heparin  5,000 Units Subcutaneous Q8H   metoprolol tartrate  25 mg Oral Q8H   midodrine  10 mg Oral TID WC   multivitamin  1 tablet Oral QHS   mouth rinse  15 mL Mouth Rinse 4 times per day   pantoprazole  40 mg Oral Daily   revefenacin  175 mcg Nebulization Daily   Continuous Infusions:  sodium chloride 10 mL/hr at 10/18/22 2141    ceFAZolin (ANCEF) IV 1 g (10/18/22 2142)   lactated ringers       LOS: 10 days    Time spent: 35 mins    Deanglo Hissong, MD [facility]   If 7PM-7AM, please contact night-coverage

## 2022-10-20 DIAGNOSIS — N186 End stage renal disease: Secondary | ICD-10-CM | POA: Diagnosis not present

## 2022-10-20 DIAGNOSIS — B9561 Methicillin susceptible Staphylococcus aureus infection as the cause of diseases classified elsewhere: Secondary | ICD-10-CM | POA: Diagnosis not present

## 2022-10-20 DIAGNOSIS — I120 Hypertensive chronic kidney disease with stage 5 chronic kidney disease or end stage renal disease: Secondary | ICD-10-CM | POA: Diagnosis not present

## 2022-10-20 DIAGNOSIS — I4891 Unspecified atrial fibrillation: Secondary | ICD-10-CM | POA: Diagnosis not present

## 2022-10-20 DIAGNOSIS — D631 Anemia in chronic kidney disease: Secondary | ICD-10-CM | POA: Diagnosis not present

## 2022-10-20 DIAGNOSIS — R7881 Bacteremia: Secondary | ICD-10-CM | POA: Diagnosis not present

## 2022-10-20 DIAGNOSIS — N250 Renal osteodystrophy: Secondary | ICD-10-CM | POA: Diagnosis not present

## 2022-10-20 DIAGNOSIS — Z992 Dependence on renal dialysis: Secondary | ICD-10-CM | POA: Diagnosis not present

## 2022-10-20 MED ORDER — HEPARIN SODIUM (PORCINE) 1000 UNIT/ML IJ SOLN
INTRAMUSCULAR | Status: AC
  Filled 2022-10-20: qty 4

## 2022-10-20 NOTE — Plan of Care (Signed)

## 2022-10-20 NOTE — Procedures (Signed)
HD Note:  Some information was entered later than the data was gathered due to patient care needs. The stated time with the data is accurate.  Spoke with Dr. Arlean Hopping at [phone].  Patient refused to come to dialysis in a bed.  Order received to allow him to be in a chair. Received patient in chair to the unit.  Alert and oriented.  Informed consent signed and in chart.   Patient BP was too low to pull UF at the very beginning of the treatment.  Had been given midodrine prior to coming to the unit.  The BP   TX duration: 3.5 hours  Patient tolerated treatment well.   Transported back to the room   Alert, without acute distress.    Access used: Left upper chest HD catheter Access issues: None  Total UF removed: 3400 ml (3500 ml removed, 100 ml returned post treatment for low BP.  See flowsheet)  Hand-off given to patient's nurse.   Jewelz Kobus L. Dareen Piano, RN [facility].

## 2022-10-20 NOTE — Progress Notes (Signed)
[facility] Progress Note   Subjective:  Seen at start of HD - had to be bumped from yesterday due to other HD emergency. Ongoing dyspnea, no other c/o today.  Objective Vitals:   10/20/22 0437 10/20/22 0440 10/20/22 0721 10/20/22 0814  BP: 100/60 110/64 94/77 (!) 83/64  Pulse: 99 98  (!) 126  Resp: 20 20  19   Temp: 98.1 F (36.7 C) 98 F (36.7 C) 98.2 F (36.8 C)   TempSrc: Oral  Axillary   SpO2: 93% 94%  98%  Weight:    75.7 kg  Height:       Physical Exam General: Chronically ill appearing man, NAD. Nasal O2 in place. In HD recliner. Heart: Irregularly irregular, no murmur Lungs: CTA in upper lobes, bibasilar rales present Abdomen: soft Extremities: no LE edema Dialysis Access: [facility]  Additional Objective Labs: Basic Metabolic Panel: Recent Labs  Lab 10/14/22 1557 10/15/22 0556 10/16/22 0008 10/17/22 0717 10/18/22 1141  NA 132* 132* 130* 128* 132*  K 4.0 4.8 4.0 4.7 4.6  CL 96* 99 95* 93* 92*  CO2 23 21* 22 19* 21*  GLUCOSE 123* 112* 125* 107* 119*  BUN 11 17 34* 61* 44*  CREATININE 2.18* 2.17* 3.53* 5.82* 4.78*  CALCIUM 8.7* 8.8* 8.8* 8.9 9.2  PHOS 1.6* 2.0* 2.4*  --   --    Liver Function Tests: Recent Labs  Lab 10/14/22 0402 10/14/22 1557 10/15/22 0556  ALBUMIN 2.4* 2.5* 2.4*   CBC: Recent Labs  Lab 10/15/22 0556 10/16/22 0008 10/17/22 0156 10/18/22 0143 10/19/22 0035  WBC 13.4* 14.4* 15.4* 12.5* 11.6*  HGB 9.5* 9.6* 9.4* 9.1* 9.2*  HCT 31.1* 32.4* 31.7* 29.7* 30.5*  MCV 87.6 85.3 85.7 83.7 82.7  PLT 243 245 252 218 227   Medications:  sodium chloride 10 mL/hr at 10/18/22 2141    ceFAZolin (ANCEF) IV 1 g (10/19/22 2009)   lactated ringers      (feeding supplement) PROSource Plus  30 mL Oral TID WC   arformoterol  15 mcg Nebulization BID   Chlorhexidine Gluconate Cloth  6 each Topical Q0600   Darbepoetin Alfa  150 mcg Subcutaneous Q Thu-1800   feeding supplement (NEPRO CARB STEADY)  237 mL Oral TID BM   heparin  5,000  Units Subcutaneous Q8H   metoprolol tartrate  25 mg Oral Q8H   midodrine  10 mg Oral TID WC   multivitamin  1 tablet Oral QHS   mouth rinse  15 mL Mouth Rinse 4 times per day   pantoprazole  40 mg Oral Daily   revefenacin  175 mcg Nebulization Daily    Dialysis Orders: TTS at [facility] 3:45hr, 450/A1.5, EDW 85.7kg, 2K/2Ca bath, TDC, heparin 3000 unit bolus - no ESA - Calcitriol 1.42mcg PO q HD   Assessment/Plan: MSSA bacteremia/endocarditis/L5-S1 discitis: Blood Cx 6/16 MSSA, negative Cx on 6/17, 6/19. S/p TDC line holiday. TTE was concerning for endocarditis. TEE cancelled d/t high risk factors. LS MRI with possible L5-S1 discitis. On IV Cefazolin 2g q HD x 6 weeks (until 11/04/22). BP/Volume overload: Hypotension preventing adequate UF. Required ICU transfer for CRRT 7/21 - 10/15/22, able to get down to ~ 74kg range. Weight back up to 79kg on 7/26 - dialyzed but unable to get any fluid off due to hypotension and intermittent tachycardia. No effect with IV albumin and midodrine. Metoprolol dose reduced from 50mg  TID -> 25mg  TID. Fluid restrictions disc. Did MUCH better with HD 7/27, HD again today. Deconditioning /weakness: was in  CIR, was transferred back to 6E due to  medical complications ESRD: See above. Usual TTS schedule, then CRRT 7/21 - 7/24, back to iHD. Did poorly 7/26, better on 7/27. HD again today, then tomorrow will be usual schedule - essentially serial HD until euvolemic.  Access: TDC pulled 6/18 for line holiday, then new [facility] TDC placed 7/01 per IR. For perm access: s/p L AVF ligation in 2019. Last seen by Dr. Edilia Bo 01/2022 - limited mobility in R arm so planning for L arm graft but had not scheduled surgery yet. Can revisit as an outpatient once bacteremia resolved.  Anemia (ESRD + ABLA, + FOBT): Hgb 9.2 - stable for now. Continue Aranesp q Thursday.   CKD-MBD: CorrCa rising, Phos low side - no binders. Hold VDRA. Nutrition - Renal diet, Alb low - continue supplements  Ozzie Hoyle, PA-C 10/20/2022, 8:20 AM  [employer]

## 2022-10-20 NOTE — Progress Notes (Signed)
Inpatient Rehab Admissions Coordinator:   Pt readmitted to acute service from CIR on 7/18.  Following from a distance.  Will need re-evaluations from PT/OT to determine post-acute rehab needs.    Estill Dooms, PT, DPT Admissions Coordinator [phone] 10/20/22  12:53 PM

## 2022-10-20 NOTE — Progress Notes (Signed)
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  Patient initially presented on 08/28/2022 with low back pain, found to have evidence of L5-S1 discitis and osteomyelitis, MSSA bacteremia with mitral valve endocarditis.  CT of the chest showed septic emboli with a new airspace opacities in the left upper lobe.  MRI of the brain noting scattered subcentimeter infarcts involving the bilateral cerebral hemisphere and right cerebellum.  CT surgery has been consulted, recommended conservative management as patient already started responding to antibiotics.  Patient was recommended to continue with cefazolin until 11/22/2022 per ID recommendations.  Patient also had small bowel obstruction during that hospital stay, was discharged to rehab on 09/25/2022.  Patient started to experience cough on 10/02/2022, diagnosed with a pneumonia/aspiration pneumonia, was treated with 5 days of IV Zosyn.  Patient was noted to be fluid overloaded, being managed at the hemodialysis.  Patient also had dysphagia, drop in hemoglobin to 6.1 with a stool occult being positive.  Patient was seen by GI, esophagram performed which was negative for any acute pathology.  On 10/09/2022 developed atrial fibrillation with rapid ventricular rate, considering this patient is transferred to [facility].   EKG showed first-degree AV block, sinus tachycardia.  Patient was found to be in any respiratory distress due to combination of, pneumonia.  Patient was started on Zosyn, transition to cefazolin.  Patient developed respiratory failure, transferred to ICU, underwent CRRT.  Patient lost 17 pounds since admission.  Acute hypoxic respiratory failure -Secondary to fluid overload, pneumonia -Continue with cefazolin until 11/04/2022 -Underwent hemodialysis continues to have improvement. -continues to have good improvement with dialysis  Pneumonia secondary to septic emboli due to endocarditis -Continue the cefazolin -Continue the DuoNebs, Solu-Medrol.  Chronic hypotension -Patient is on Toprol-XL 50 mg every 8 hours, decreasing dose and follow-up -Continue with midodrine 10 mg 3 times a day -patient is maintaining blood pressure  ESRD on HD -Continue with the hemodialysis -Nephrology is following with the patient. - removed 2 L of fluid on 07/27, plan to continue with the hemodialysis 7/28  Paroxysmal atrial fibrillation with rapid ventricular rate -Patient required Cardizem drip -Currently rate is controlled with the metoprolol every 8 hours -Closely follow-up with the potassium greater than 4, magnesium greater than 2.  Dysphagia -Evaluated by speech therapy, cleared for dysphagia diet with thin liquids.  Anemia of chronic disease, chronic kidney disease -Closely monitor CBC -Transfuse for hemoglobin less than 7.  Infectious endocarditis of mitral valve -Patient had extensive septic embolization to the lungs and brain -MSSA bacteremia  L5-S1 discitis with osteomyelitis -Continue with the current antibiotic regimen -Continue with the cefazolin until 11/04/2022   DVT prophylaxis: Heparin sq Code Status:Full code Family Communication:No family Disposition Plan:  discharge to SNF when cleared by nephrology   Status is: Inpatient Remains inpatient appropriate because:  pending  clinical improvement with fluid overload,  CHF     Consultants:  Cardiology Nephrology  Procedures:  Antimicrobials: Anti-infectives (From admission, onward)    Start     Dose/Rate Route Frequency Ordered Stop   10/16/22 2000  ceFAZolin (ANCEF) IVPB 1 g/50 mL premix        1 g 100 mL/hr over 30 Minutes Intravenous Every 24 hours 10/15/22 1355     10/12/22 1800  ceFAZolin (ANCEF) IVPB 2g/100 mL premix  Status:  Discontinued        2 g 200 mL/hr over 30 Minutes Intravenous Every 12 hours 10/12/22 1557 10/15/22 1355   10/09/22 1800  ceFAZolin (ANCEF) IVPB 2g/100 mL premix  Status:  Discontinued        2 g 200 mL/hr over 30 Minutes Intravenous Every T-Th-Sa (1800) 10/09/22 1619 10/12/22 1557       Subjective:  Underwent hemodialysis, removed 3.5 L of fluid.  Denies any shortness of breath.  Experiencing mild generalized weakness.  Tolerating diet well.  Sitting in the chair.  Objective: Vitals:   10/20/22 1144 10/20/22 1155 10/20/22 1204 10/20/22 1647  BP: 95/64 (!) 83/65 92/63 (!) 83/60  Pulse: (!) 111 (!) 107 (!) 101 100  Resp: (!) 37 (!) 32 (!) 27   Temp:    98.1 F (36.7 C)  TempSrc:    Axillary  SpO2: 97% 100% 100% 97%  Weight:      Height:        Intake/Output Summary (Last 24 hours) at 10/20/2022 1659 Last data filed at 10/20/2022 1204 Gross per 24 hour  Intake --  Output 3500 ml  Net -3500 ml    Filed Weights   10/17/22 0815 10/20/22 0814  Weight: 79.2 kg 75.7 kg    Examination:  General exam: Appears comfortable, deconditioned, not in any acute distress. Respiratory system: Clear to auscultation. Respiratory effort normal. RR 12, Left port left upper chest. Cardiovascular system: S1 & S2 heard, RRR. No JVD, murmurs, rubs, gallops or clicks.  Gastrointestinal system: Abdomen is nondistended, soft and nontender. Normal bowel sounds heard. Central nervous system: Alert and oriented x3. No focal neurological deficits. Extremities: No edema, no cyanosis, no  clubbing Skin: No rashes, lesions or ulcers Psychiatry: Judgement and insight appear normal. Mood & affect appropriate.     Data Reviewed: I have personally reviewed following labs and imaging studies  CBC: Recent Labs  Lab 10/15/22 0556 10/16/22 0008 10/17/22 0156 10/18/22 0143 10/19/22 0035  WBC 13.4* 14.4* 15.4* 12.5* 11.6*  HGB 9.5* 9.6* 9.4* 9.1* 9.2*  HCT 31.1* 32.4* 31.7* 29.7* 30.5*  MCV 87.6 85.3 85.7 83.7 82.7  PLT 243 245 252 218 227   Basic Metabolic Panel: Recent Labs  Lab 10/14/22 0402 10/14/22 1557 10/15/22 0556 10/16/22 0008 10/17/22 0717 10/18/22 1141  NA 133* 132* 132* 130* 128* 132*  K 4.2 4.0 4.8 4.0 4.7 4.6  CL 98 96* 99 95* 93* 92*  CO2 23 23 21* 22 19* 21*  GLUCOSE 115* 123* 112* 125* 107* 119*  BUN 13 11 17  34* 61* 44*  CREATININE 2.29* 2.18* 2.17* 3.53* 5.82* 4.78*  CALCIUM 8.5* 8.7* 8.8* 8.8* 8.9 9.2  MG 2.4  --  2.7* 2.5*  --   --   PHOS 2.1* 1.6* 2.0* 2.4*  --   --    GFR: Estimated Creatinine Clearance: 16.2 mL/min (A) (by C-G formula based on SCr of 4.78 mg/dL (H)). Liver Function Tests: Recent Labs  Lab 10/14/22 0402 10/14/22 1557 10/15/22 0556  ALBUMIN 2.4* 2.5* 2.4*  No results for input(s): "LIPASE", "AMYLASE" in the last 168 hours. No results for input(s): "AMMONIA" in the last 168 hours. Coagulation Profile: No results for input(s): "INR", "PROTIME" in the last 168 hours.  Cardiac Enzymes: No results for input(s): "CKTOTAL", "CKMB", "CKMBINDEX", "TROPONINI" in the last 168 hours. BNP (last 3 results) No results for input(s): "PROBNP" in the last 8760 hours. HbA1C: No results for input(s): "HGBA1C" in the last 72 hours. CBG: Recent Labs  Lab 10/14/22 1613  GLUCAP 131*   Lipid Profile: No results for input(s): "CHOL", "HDL", "LDLCALC", "TRIG", "CHOLHDL", "LDLDIRECT" in the last 72 hours. Thyroid Function Tests: No results for input(s): "TSH", "T4TOTAL", "FREET4", "T3FREE", "THYROIDAB" in the last 72  hours.  Anemia Panel: No results for input(s): "VITAMINB12", "FOLATE", "FERRITIN", "TIBC", "IRON", "RETICCTPCT" in the last 72 hours. Sepsis Labs: No results for input(s): "PROCALCITON", "LATICACIDVEN" in the last 168 hours.   Recent Results (from the past 240 hour(s))  MRSA Next Gen by PCR, Nasal     Status: None   Collection Time: 10/12/22  3:03 PM   Specimen: Nasal Mucosa; Nasal Swab  Result Value Ref Range Status   MRSA by PCR Next Gen NOT DETECTED NOT DETECTED Final    Comment: (NOTE) The GeneXpert MRSA Assay (FDA approved for NASAL specimens only), is one component of a comprehensive MRSA colonization surveillance program. It is not intended to diagnose MRSA infection nor to guide or monitor treatment for MRSA infections. Test performance is not FDA approved in patients less than 53 years old. Performed at [facility], [address], [place], [place] [address]     Radiology Studies: No results found.   Scheduled Meds:  (feeding supplement) PROSource Plus  30 mL Oral TID WC   arformoterol  15 mcg Nebulization BID   Chlorhexidine Gluconate Cloth  6 each Topical Q0600   Darbepoetin Alfa  150 mcg Subcutaneous Q Thu-1800   feeding supplement (NEPRO CARB STEADY)  237 mL Oral TID BM   heparin  5,000 Units Subcutaneous Q8H   metoprolol tartrate  25 mg Oral Q8H   midodrine  10 mg Oral TID WC   multivitamin  1 tablet Oral QHS   mouth rinse  15 mL Mouth Rinse 4 times per day   pantoprazole  40 mg Oral Daily   revefenacin  175 mcg Nebulization Daily   Continuous Infusions:  sodium chloride 10 mL/hr at 10/18/22 2141    ceFAZolin (ANCEF) IV 1 g (10/19/22 2009)   lactated ringers       LOS: 11 days    Time spent: 35 mins    Rooney Swails, MD [facility]   If 7PM-7AM, please contact night-coverage

## 2022-10-20 NOTE — Evaluation (Signed)
Physical Therapy Evaluation Patient Details Name: Ryan [name] MRN: [id number] DOB: 1960-11-19 Today's Date: 10/20/2022  History of Present Illness  62 yo male presents to [facility] from CIR on 7/18 for afib RVR, aspiration PNA. RR on 7/21 for acute respiratory distress due to pulmonary edema requiring bipap and CRRT, and transfer to ICU. 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 S/p  HD cath L subclavian on 7/1. PMH includes HTN, HLD, ESRD on HD TTS.  Clinical Impression   Pt presents with decreased muscular strength proximally>distally, impaired balance, impaired activity tolerance. Pt to benefit from acute PT to address deficits. Pt up in recliner upon PT arrival to room, pt requiring min assist for stand and pre-gait activities but reports too fatigued for gait at present given HD multiple days in a row. PT recommending return to AIR post-acutely. PT to progress mobility as tolerated, and will continue to follow acutely.          If plan is discharge home, recommend the following: Help with stairs or ramp for entrance;Assist for transportation;Assistance with cooking/housework;A lot of help with walking and/or transfers;A lot of help with bathing/dressing/bathroom   Can travel by private vehicle        Equipment Recommendations Rollator (4 wheels)  Recommendations for Other Services       Functional Status Assessment Patient has had a recent decline in their functional status and demonstrates the ability to make significant improvements in function in a reasonable and predictable amount of time.     Precautions / Restrictions Precautions Precautions: Fall Required Braces or Orthoses: Spinal Brace Spinal Brace: Lumbar corset;Other (comment) Spinal Brace Comments: for comfort Restrictions Weight Bearing Restrictions: No      Mobility  Bed  Mobility Overal bed mobility: Needs Assistance             General bed mobility comments: up in recliner    Transfers Overall transfer level: Needs assistance Equipment used: Rolling walker (2 wheels) Transfers: Sit to/from Stand Sit to Stand: Min assist           General transfer comment: assist to rise, steady.    Ambulation/Gait             Pre-gait activities: Pt tolerated standing marches, forward backward stepping with increased time and steadying assist    Stairs            Wheelchair Mobility     Tilt Bed    Modified Rankin (Stroke Patients Only)       Balance Overall balance assessment: Needs assistance Sitting-balance support: No upper extremity supported, Feet supported Sitting balance-Leahy Scale: Fair     Standing balance support: Bilateral upper extremity supported, During functional activity, Reliant on assistive device for balance Standing balance-Leahy Scale: Poor                               Pertinent Vitals/Pain Pain Assessment Pain Assessment: 0-10 Faces Pain Scale: Hurts a little bit Pain Location: Lower Back Pain Descriptors / Indicators: Sore Pain Intervention(s): Limited activity within patient's tolerance, Monitored during session, Repositioned    Home Living Family/patient expects to be discharged to:: Private residence Living Arrangements: Children Available Help at Discharge: Family;Available 24 hours/day Type of Home: House Home Access: Stairs to enter Entrance Stairs-Rails: None Entrance Stairs-Number of Steps: 2 curb steps - 1 from sidewalk leading to  porch then 1 STE going in from porch Alternate Level Stairs-Number of Steps: flight Home Layout: Two level;Bed/bath upstairs;1/2 bath on main level Home Equipment: None      Prior Function Prior Level of Function : Independent/Modified Independent;Driving             Mobility Comments: Previously Independent without an AD, at CIR walking  with a rollator and using w/c for further distance ADLs Comments: Pt reports doing basic self care (washing face, brushing teeth), staff and family assisting with bathing, shaving since CIR admission     Hand Dominance   Dominant Hand: Right    Extremity/Trunk Assessment   Upper Extremity Assessment Upper Extremity Assessment: Defer to OT evaluation    Lower Extremity Assessment Lower Extremity Assessment: Generalized weakness (3+/5 proximally, grossly 4/5 otherwise)    Cervical / Trunk Assessment Cervical / Trunk Assessment: Kyphotic  Communication   Communication: No difficulties  Cognition Arousal/Alertness: Awake/alert Behavior During Therapy: WFL for tasks assessed/performed Overall Cognitive Status: Impaired/Different from baseline Area of Impairment: Awareness, Problem solving, Memory, Safety/judgement, Following commands                     Memory: Decreased short-term memory Following Commands: Follows one step commands with increased time Safety/Judgement: Decreased awareness of safety Awareness: Emergent Problem Solving: Requires verbal cues, Slow processing General Comments: pt slumped in recliner nearly sitting on legrest portion, unaware of safety issue of this        General Comments General comments (skin integrity, edema, etc.): vss on 1LO2    Exercises     Assessment/Plan    PT Assessment Patient needs continued PT services  PT Problem List Decreased strength;Decreased activity tolerance;Decreased balance;Decreased mobility;Decreased coordination;Decreased knowledge of use of DME;Decreased safety awareness;Decreased knowledge of precautions       PT Treatment Interventions DME instruction;Gait training;Stair training;Functional mobility training;Therapeutic activities;Therapeutic exercise;Balance training;Neuromuscular re-education;Patient/family education    PT Goals (Current goals can be found in the Care Plan section)  Acute Rehab PT  Goals Patient Stated Goal: back to rehab to get stronger PT Goal Formulation: With patient Time For Goal Achievement: 11/03/22 Potential to Achieve Goals: Good    Frequency Min 1X/[name]     Co-evaluation               AM-PAC PT "6 Clicks" Mobility  Outcome Measure Help needed turning from your back to your side while in a flat bed without using bedrails?: A Little Help needed moving from lying on your back to sitting on the side of a flat bed without using bedrails?: A Little Help needed moving to and from a bed to a chair (including a wheelchair)?: A Little Help needed standing up from a chair using your arms (e.g., wheelchair or bedside chair)?: A Little Help needed to walk in hospital room?: A Lot Help needed climbing 3-5 steps with a railing? : Total 6 Click Score: 15    End of Session   Activity Tolerance: Patient tolerated treatment well Patient left: in chair;with call bell/phone within reach;Other (comment) (pt already up in chair without chair alarm pad in place upon PT arrival to room, pt verbalizes he will not get up without assist from staff) Nurse Communication: Mobility status PT Visit Diagnosis: Other abnormalities of gait and mobility (R26.89);Muscle weakness (generalized) (M62.81)    Time: 0981-1914 PT Time Calculation (min) (ACUTE ONLY): 19 min   Charges:   PT Evaluation $PT Eval Low Complexity: 1 Low   PT General Charges $$  ACUTE PT VISIT: 1 Visit         Marye Round, PT DPT Acute Rehabilitation Services Secure Chat Preferred  Office [phone]   Ryan [name] 10/20/2022, 4:37 PM

## 2022-10-21 DIAGNOSIS — E854 Organ-limited amyloidosis: Secondary | ICD-10-CM | POA: Diagnosis not present

## 2022-10-21 DIAGNOSIS — N186 End stage renal disease: Secondary | ICD-10-CM | POA: Diagnosis not present

## 2022-10-21 DIAGNOSIS — I43 Cardiomyopathy in diseases classified elsewhere: Secondary | ICD-10-CM

## 2022-10-21 DIAGNOSIS — D638 Anemia in other chronic diseases classified elsewhere: Secondary | ICD-10-CM | POA: Diagnosis not present

## 2022-10-21 DIAGNOSIS — R195 Other fecal abnormalities: Secondary | ICD-10-CM | POA: Diagnosis not present

## 2022-10-21 DIAGNOSIS — I4891 Unspecified atrial fibrillation: Secondary | ICD-10-CM | POA: Diagnosis not present

## 2022-10-21 DIAGNOSIS — I39 Endocarditis and heart valve disorders in diseases classified elsewhere: Secondary | ICD-10-CM | POA: Diagnosis not present

## 2022-10-21 DIAGNOSIS — E785 Hyperlipidemia, unspecified: Secondary | ICD-10-CM | POA: Diagnosis not present

## 2022-10-21 DIAGNOSIS — Z992 Dependence on renal dialysis: Secondary | ICD-10-CM | POA: Diagnosis not present

## 2022-10-21 DIAGNOSIS — J9601 Acute respiratory failure with hypoxia: Secondary | ICD-10-CM | POA: Diagnosis not present

## 2022-10-21 DIAGNOSIS — M4626 Osteomyelitis of vertebra, lumbar region: Secondary | ICD-10-CM

## 2022-10-21 DIAGNOSIS — I10 Essential (primary) hypertension: Secondary | ICD-10-CM | POA: Diagnosis not present

## 2022-10-21 MED ORDER — CEFAZOLIN SODIUM-DEXTROSE 2-4 GM/100ML-% IV SOLN
2.0000 g | INTRAVENOUS | Status: AC
  Administered 2022-10-21 – 2022-11-04 (×7): 2 g via INTRAVENOUS
  Filled 2022-10-21 (×7): qty 100

## 2022-10-21 MED ORDER — HEPARIN SODIUM (PORCINE) 1000 UNIT/ML IJ SOLN
3000.0000 [IU] | Freq: Once | INTRAMUSCULAR | Status: AC
  Administered 2022-10-21: 3000 [IU] via INTRAVENOUS

## 2022-10-21 NOTE — Hospital Course (Addendum)
Ryan [name] was admitted to the hospital with the working diagnosis of acute hypoxemic respiratory failure due to pneumonia in the setting of endocarditis with septic emboli.   62 yo male with the past medical history of hypertension, ESRD, and hyperlipidemia who was transferred from CIR to inpatient setting due to suspected new onset atrial fibrillation with RVR. Recent hospitalization 06/16 to 07/04 for mitral valve endocarditis, MSSA bacteremia with brain septic emboli.  One week after his discharge while in CIR he was diagnosed with pneumonia, and worsening anemia. On 07/18 he developed suspected atrial fibrillation prompting his transfer back to inpatient setting.  On his initial physical examination his blood pressure was 103/72, HR 101, RR 20 and temp 97.7, lungs with bibasilar rhonchi, heart with S1 and S2 present and irregular, abdomen with no distention and no lower extremity edema.   Na 124, K 3,9 Cl 88, bicarbonate 24, glucose 115 bun 40 cr 7,42 Wbc 14.3 hgb 7.7 plt 377  TSH 8,9   Chest radiograph with cardiomegaly, bilateral interstitial infiltrates symmetric and central, bilateral hilar vascular congestion. HD catheter in the left internal jugular vein with tip in the SVC.   EKG 135 bpm, normal axis, normal intervals, sinus rhythm with 1st degree AV block and PAC, no significant ST segment or T wave changes.   7/18 - Readmitted to [facility] for Afib RVR/SOB requiring BiPAP, inability to complete HD. 7/22 - Off of BiPAP, tolerating [place]. Remains anxious. Tolerating CRRT. 7/24 patient on sinus rhythm and off CRRT.  07/25 transferred to [facility].   08/04 patient with improvement in volume status, tolerating renal replacement therapy with ultrafiltration.  Has been placed on midodrine for blood pressure support.  Pending transfer back to CIR to continue physical therapy.  08/08 continue inpatient renal replacement therapy.  08/13 pending transfer to CIR.

## 2022-10-21 NOTE — Progress Notes (Signed)
Subjective: Seen eating breakfast, mild cough, continues to need oxygen.  dialysis yesterday for extra UF tolerated.  Today's normal day for HD today and UF as able  Objective Vital signs in last 24 hours: Vitals:   10/21/22 0400 10/21/22 0500 10/21/22 0758 10/21/22 0804  BP:  102/68  94/63  Pulse:  (!) 108 (!) 104 (!) 101  Resp:   (!) 22 17  Temp:  97.9 F (36.6 C)  98.2 F (36.8 C)  TempSrc:  Oral  Axillary  SpO2: 99% 99% 97% 99%  Weight:      Height:       Weight change:   Physical Exam: General: Chronically ill male NAD Nasal oxygen in place Heart: Ireg, irreg , no MRG Lungs: CTA, nonlabored breathing , 2l [place]  O2  Abdomen: NABS, soft nontender no ascites nondistended Extremities: Trace bipedal edema Dialysis Access: TDC    OP Dialysis Orders: TTS at [facility] 3:45hr, 450/A1.5, EDW 85.7kg, 2K/2Ca bath, TDC, heparin 3000 unit bolus - no ESA - Calcitriol 1.37mcg PO q HD   Problem/Plan: MSSA bacteremia/endocarditis/L5-S1 discitis: Blood Cx 6/16 MSSA, negative Cx on 6/17, 6/19. S/p TDC line holiday. TTE was concerning for endocarditis. TEE cancelled d/t high risk factors. LS MRI with possible L5-S1 discitis. On IV Cefazolin 2g q HD x 6 weeks (until 11/04/22). BP/Volume overload: Hypotension preventing adequate UF. Marland Kitchen Required ICU transfer for CRRT 7/21 - 10/15/22, able to get down to ~ 74kg range. Weight back up to 79kg on 7/26 - dialyzed but unable to get any fluid off due to hypotension and intermittent tachycardia. No effect with IV albumin and midodrine. Metoprolol dose reduced from 50mg  TID -> 25mg  TID. Fluid restrictions disc  Did MUCH better with HD 7/27, again yest  with 3.4 l uf   HD  with uf  today nl schedule . Deconditioning /weakness: was in CIR, was transferred back to 6E due to  medical complications ESRD: . Usual TTS schedule, see abv Access: TDC pulled 6/18 for line holiday, then new LIJ TDC placed 7/01 per IR. For perm access: s/p L AVF ligation in 2019. Last seen by Dr.  Edilia Bo 01/2022 - limited mobility in R arm so planning for L arm graft but had not scheduled surgery yet. Can revisit as an outpatient once bacteremia resolved.  Anemia (ESRD + ABLA, + FOBT): Hgb 9.2 - stable for now. Continue Aranesp q Thursday.   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 Hold VDRA. Nutrition - Renal diet, Alb low - continue supplement   Lenny Pastel, PA-C [facility] [phone] 10/21/2022,9:28 AM  LOS: 12 days   Labs: Basic Metabolic Panel: Recent Labs  Lab 10/14/22 1557 10/15/22 0556 10/16/22 0008 10/17/22 0717 10/18/22 1141  NA 132* 132* 130* 128* 132*  K 4.0 4.8 4.0 4.7 4.6  CL 96* 99 95* 93* 92*  CO2 23 21* 22 19* 21*  GLUCOSE 123* 112* 125* 107* 119*  BUN 11 17 34* 61* 44*  CREATININE 2.18* 2.17* 3.53* 5.82* 4.78*  CALCIUM 8.7* 8.8* 8.8* 8.9 9.2  PHOS 1.6* 2.0* 2.4*  --   --    Liver Function Tests: Recent Labs  Lab 10/14/22 1557 10/15/22 0556  ALBUMIN 2.5* 2.4*   No results for input(s): "LIPASE", "AMYLASE" in the last 168 hours. No results for input(s): "AMMONIA" in the last 168 hours. CBC: Recent Labs  Lab 10/15/22 0556 10/16/22 0008 10/17/22 0156 10/18/22 0143 10/19/22 0035  WBC 13.4* 14.4*  15.4* 12.5* 11.6*  HGB 9.5* 9.6* 9.4* 9.1* 9.2*  HCT 31.1* 32.4* 31.7* 29.7* 30.5*  MCV 87.6 85.3 85.7 83.7 82.7  PLT 243 245 252 218 227   Cardiac Enzymes: No results for input(s): "CKTOTAL", "CKMB", "CKMBINDEX", "TROPONINI" in the last 168 hours. CBG: Recent Labs  Lab 10/14/22 1613  GLUCAP 131*    Studies/Results: No results found. Medications:  sodium chloride 10 mL/hr at 10/18/22 2141    ceFAZolin (ANCEF) IV 1 g (10/20/22 2050)   lactated ringers      (feeding supplement) PROSource Plus  30 mL Oral TID WC   arformoterol  15 mcg Nebulization BID   Chlorhexidine Gluconate Cloth  6 each Topical Q0600   Darbepoetin Alfa  150 mcg Subcutaneous Q Thu-1800   feeding supplement (NEPRO CARB STEADY)  237  mL Oral TID BM   heparin  5,000 Units Subcutaneous Q8H   metoprolol tartrate  25 mg Oral Q8H   midodrine  10 mg Oral TID WC   multivitamin  1 tablet Oral QHS   mouth rinse  15 mL Mouth Rinse 4 times per day   pantoprazole  40 mg Oral Daily   revefenacin  175 mcg Nebulization Daily

## 2022-10-21 NOTE — Progress Notes (Signed)
PHARMACY NOTE:  ANTIMICROBIAL RENAL DOSAGE ADJUSTMENT  Current antimicrobial regimen includes a mismatch between antimicrobial dosage and estimated renal function.  As per policy approved by the Pharmacy & Therapeutics and Medical Executive Committees, the antimicrobial dosage will be adjusted accordingly.  Current antimicrobial dosage: cefazolin 1g IV q24h  Indication: MV endocarditis/discitis  Renal Function: ESRD on HD TuThSa  Estimated Creatinine Clearance: 16.2 mL/min (A) (by C-G formula based on SCr of 4.78 mg/dL (H)). [x]      On intermittent HD, scheduled: TuThSa []      On CRRT    Antimicrobial dosage has been changed to: cefazolin 2g IV after HD TuThSa  Additional comments:    Thank you for allowing pharmacy to be a part of this patient's care.  Romie Minus, PharmD PGY1 Pharmacy Resident  Please check AMION for all [facility] Pharmacy phone numbers After 10:00 PM, call Main Pharmacy [phone]

## 2022-10-21 NOTE — Progress Notes (Signed)
   10/21/22 0804  Assess: MEWS Score  Temp 98.2 F (36.8 C)  BP 94/63  MAP (mmHg) 73  Pulse Rate (!) 101  Resp 17  SpO2 99 %  O2 Device Nasal Cannula  Assess: MEWS Score  MEWS Temp 0  MEWS Systolic 1  MEWS Pulse 1  MEWS RR 0  MEWS LOC 0  MEWS Score 2  MEWS Score Color Yellow  Assess: if the MEWS score is Yellow or Red  Were vital signs accurate and taken at a resting state? Yes  Does the patient meet 2 or more of the SIRS criteria? No  Does the patient have a confirmed or suspected source of infection? No  MEWS guidelines implemented  No, previously yellow, continue vital signs every 4 hours  Assess: SIRS CRITERIA  SIRS Temperature  0  SIRS Pulse 1  SIRS Respirations  0  SIRS WBC 0  SIRS Score Sum  1

## 2022-10-21 NOTE — Progress Notes (Signed)
PROGRESS NOTE    AD GLAVE  DGU:[id number] DOB: 1960-05-20 DOA: 10/09/2022 PCP: Sharin Grave, MD    Brief Narrative:   62 year old male with history of hypertension, hyperlipidemia, ESRD on HD was admitted with new onset atrial fibrillation with rapid ventricular rate. Patient initially presented on 08/28/2022 with low back pain, found to have evidence of L5-S1 discitis and osteomyelitis, MSSA bacteremia with mitral valve endocarditis. CT of the chest showed septic emboli with a new airspace opacities in the left upper lobe. MRI of the brain showed scattered subcentimeter infarcts involving the bilateral cerebral hemisphere and right cerebellum. CT surgery  was consulted, recommended conservative management as patient already started responding to antibiotics.  Patient was recommended to continue with cefazolin until 11/22/2022 per ID recommendations. Patient also had small bowel obstruction during that hospital stay, was discharged to rehab on 09/25/2022. Patient started to experience cough on 10/02/2022, diagnosed with a pneumonia/aspiration pneumonia, was treated with 5 days of IV Zosyn. Patient was noted to be fluid overloaded, being managed at the hemodialysis. Patient also had dysphagia, drop in hemoglobin to 6.1 with a stool occult being positive. Patient was seen by GI, esophagram performed which was negative for any acute pathology. On 10/09/2022 developed atrial fibrillation  with rapid ventricular rate, and was transferred to [facility]. EKG showed first-degree AV block, sinus tachycardia. Patient was found to be in any respiratory distress due to combination of, pneumonia. Patient was started on Zosyn, transition to cefazolin. Patient developed significant respiratory failure, transferred to ICU, underwent CRRT. Patient lost 17 pounds since admission.  At this time plan is disposition to CIR.  Assessment and plan  Acute hypoxic respiratory failure Secondary to fluid overload,  pneumonia.Continue with cefazolin until 11/04/2022. On [place] 1 L/min   Pneumonia secondary to septic emboli due to endocarditis On cefazolin, continue DuoNebs, improved.   Chronic hypotension On reduced  dose of metoprolol and midodrine. Asymptomatic.   ESRD on HD -Continue with the hemodialysis as per schedule. Nephrology on board.  Paroxysmal atrial fibrillation with rapid ventricular rate Initially required Cardizem drip.  Currently rate  is largely controlled with metoprolol.  Dysphagia Evaluated by speech therapy, on dysphagia III diet with thin liquids.   Anemia of chronic disease, Chronic kidney disease -Closely monitor CBC, transfuse for hemoglobin less than 7.  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 MSSA bacteremia. On cefazolin   L5-S1 discitis with osteomyelitis Continue cefazolin until 11/04/2022   Nutrition Status: Nutrition Problem: Moderate Malnutrition Etiology: chronic illness (worsening with acute illness) Signs/Symptoms: moderate muscle depletion, moderate fat depletion, energy intake < 75% for > or equal to 1 month Interventions: Refer to RD note for recommendations     DVT prophylaxis: heparin injection 5,000 Units Start: 10/09/22 1700   Code Status:     Code Status: Full Code  Disposition: CIR as per PT recommendation   Status is: Inpatient  Remains inpatient appropriate because: IV cefazolin, need for CIR, hemodialysis   Family Communication: None at bedside  Consultants:  Nephrology Cardiology  Procedures:   hemodialysis  Antimicrobials:  Cefazolin until 8/31   Anti-infectives (From admission, onward)    Start     Dose/Rate Route Frequency Ordered Stop   10/21/22 1800  ceFAZolin (ANCEF) IVPB 2g/100 mL premix        2 g 200 mL/hr over 30 Minutes Intravenous Every T-Th-Sa (1800) 10/21/22 1154     10/16/22 2000  ceFAZolin (ANCEF)  IVPB 1 g/50 mL premix  Status:   Discontinued        1 g 100 mL/hr over 30 Minutes Intravenous Every 24 hours 10/15/22 1355 10/21/22 1154   10/12/22 1800  ceFAZolin (ANCEF) IVPB 2g/100 mL premix  Status:  Discontinued        2 g 200 mL/hr over 30 Minutes Intravenous Every 12 hours 10/12/22 1557 10/15/22 1355   10/09/22 1800  ceFAZolin (ANCEF) IVPB 2g/100 mL premix  Status:  Discontinued        2 g 200 mL/hr over 30 Minutes Intravenous Every T-Th-Sa (1800) 10/09/22 1619 10/12/22 1557       Subjective: Today, patient was seen and examined at bedside.  Patient complains of mild cough.  No nausea vomiting or abdominal pain has had bowel movements.  Denies shortness of breath or dyspnea.  Objective: Vitals:   10/21/22 0400 10/21/22 0500 10/21/22 0758 10/21/22 0804  BP:  102/68  94/63  Pulse:  (!) 108 (!) 104 (!) 101  Resp:   (!) 22 17  Temp:  97.9 F (36.6 C)  98.2 F (36.8 C)  TempSrc:  Oral  Axillary  SpO2: 99% 99% 97% 99%  Weight:      Height:       No intake or output data in the 24 hours ending 10/21/22 1321 Filed Weights   10/17/22 0815 10/20/22 0814  Weight: 79.2 kg 75.7 kg    Physical Examination: Body mass index is 24.65 kg/m.   General:  Average built, not in obvious distress, on nasal cannula oxygen HENT:   No scleral pallor or icterus noted. Oral mucosa is moist.  Chest:   Diminished breath sounds bilaterally. No crackles or wheezes.  Left chest wall hemodialysis catheter in place. CVS: S1 &S2 heard. No murmur.  Regular rate and rhythm. Abdomen: Soft, nontender, nondistended.  Bowel sounds are heard.   Extremities: No cyanosis, clubbing or edema.  Psych: Alert, awake and oriented, normal mood CNS:  No cranial nerve deficits.  Moves all extremities Skin: Warm and dry.    Data Reviewed:   CBC: Recent Labs  Lab 10/15/22 0556 10/16/22 0008 10/17/22 0156 10/18/22 0143 10/19/22 0035  WBC 13.4* 14.4* 15.4* 12.5* 11.6*  HGB 9.5* 9.6* 9.4* 9.1* 9.2*  HCT 31.1* 32.4* 31.7* 29.7* 30.5*  MCV  87.6 85.3 85.7 83.7 82.7  PLT 243 245 252 218 227    Basic Metabolic Panel: Recent Labs  Lab 10/14/22 1557 10/15/22 0556 10/16/22 0008 10/17/22 0717 10/18/22 1141  NA 132* 132* 130* 128* 132*  K 4.0 4.8 4.0 4.7 4.6  CL 96* 99 95* 93* 92*  CO2 23 21* 22 19* 21*  GLUCOSE 123* 112* 125* 107* 119*  BUN 11 17 34* 61* 44*  CREATININE 2.18* 2.17* 3.53* 5.82* 4.78*  CALCIUM 8.7* 8.8* 8.8* 8.9 9.2  MG  --  2.7* 2.5*  --   --   PHOS 1.6* 2.0* 2.4*  --   --     Liver Function Tests: Recent Labs  Lab 10/14/22 1557 10/15/22 0556  ALBUMIN 2.5* 2.4*     Radiology Studies: No results found.    LOS: 12 days    Joycelyn Das, MD [facility] Available via Epic secure chat 7am-7pm After these hours, please refer to coverage provider listed on [email or web address] 10/21/2022, 1:21 PM

## 2022-10-21 NOTE — Progress Notes (Signed)
   10/21/22 1807  Vitals  Temp 98.3 F (36.8 C)  Pulse Rate (!) 112  Resp (!) 27  BP 93/66  SpO2 98 %  O2 Device Room Air  Weight  (unable to obtain in recliner, RN aware)  Type of Weight Post-Dialysis  Oxygen Therapy  Patient Activity (if Appropriate) In chair  Pulse Oximetry Type Continuous  Oximetry Probe Site Changed No  Post Treatment  Dialyzer Clearance Lightly streaked  Duration of HD Treatment -hour(s) 3.26 hour(s)  Hemodialysis Intake (mL) 0 mL  Liters Processed 62.7  Fluid Removed (mL) 3100 mL  Tolerated HD Treatment Yes   Received patient in bed to unit.  Alert and oriented.  Informed consent signed and in chart.   TX duration:3.26  Patient tolerated well.  Transported back to the room  Alert, without acute distress.  Hand-off given to patient's nurse.   Access used: Yes Access issues: No  Total UF removed: 3100 Medication(s) given: See MAR Post HD VS: See Above Grid Post HD weight: Unable to weight pt, D/T pt in a chair   [name]

## 2022-10-21 NOTE — Progress Notes (Signed)
Initial Nutrition Assessment  DOCUMENTATION CODES:   Non-severe (moderate) malnutrition in context of chronic illness (worsening malnutrition with this acute illness)  INTERVENTION:   Dc Magic cup TID with meals, each supplement provides 290 kcal and 9 grams of protein  Continue Nepro Shake po TID, each supplement provides 425 kcal and 19 grams protein  Continue Prosource Plus PO TID, each supplement provides 100 kcal and 15 g protein   Continue Renal MVI   Encourage po intake   Advised patient to prioritize protein at meal times    NUTRITION DIAGNOSIS:   Moderate Malnutrition related to chronic illness (worsening with acute illness) as evidenced by moderate muscle depletion, moderate fat depletion, energy intake < 75% for > or equal to 1 month.  GOAL:   Patient will meet greater than or equal to 90% of their needs  MONITOR:   PO intake, Supplement acceptance, Weight trends, Labs  REASON FOR ASSESSMENT:   Consult Assessment of nutrition requirement/status (CRRT)  ASSESSMENT:   62 yo male admitted from CIR with afib with RVR and acute respiratory failure with pulmonary edema. Pt with recent admission to [facility] from 6/16 to 7/04 for L5-S1 osteomyelitis and MSSA bacteremia in addition to infective endocarditis of the MV and septic emboli to lungs and brain.  PMH includes ESRD on iHD (since 2018), HTN, HLD  6/16 Admitted to [facility] for L5-S1 osteomyelitis, MSSA bacteremia 7/04 D/C from hospital with admission to CIR 7/18 Readmitted to [facility] from CIR with Afib RVR while receiving iHD and SOB requiring BiPap 7/21 Rapid response, acute respiratory distress, transferred to ICU, CRRT initiated  Visited patient at bedside who reports he is doing a little better and forcing himself to eat/ He reports not finishing his meals, but he is trying his best. RD advised him to prioritize his protein and do his best with meal times.   He reports drinking all ONS and not  liking the magic cups. RD to d/c order.   He reports improvement in swallowing 2/2 to film on tongue and in throat. He reports the ONS sometimes get stuck on esophageal lining but he drinks water to flush it all down.   Patient reports 3 BM's yesterday and none today.   He denies N/V/D/C. He reports increase in fatigue 2/2 all the HD sessions   Labs: reviewed  Meds: Prosource Plus TID, ancef, Nepro TID, renal MVI, protonix  Wt: admit 178#, current 166#  12# wt loss since admission 12 days ago  PO: no meals documented since 7/23   I/O's: -15.3 L    NUTRITION - FOCUSED PHYSICAL EXAM:  Flowsheet Row Most Recent Value  Orbital Region Mild depletion  Upper Arm Region Mild depletion  Thoracic and Lumbar Region Mild depletion  Buccal Region Mild depletion  Temple Region Moderate depletion  Clavicle Bone Region Mild depletion  Clavicle and Acromion Bone Region Mild depletion  Scapular Bone Region Mild depletion  Dorsal Hand Moderate depletion  Patellar Region Moderate depletion  Anterior Thigh Region Moderate depletion  Posterior Calf Region Severe depletion  Edema (RD Assessment) Mild       Diet Order:   Diet Order             DIET DYS 3 Room service appropriate? Yes; Fluid consistency: Thin; Fluid restriction: 1200 mL Fluid  Diet effective now                   EDUCATION NEEDS:   Education needs have been addressed  Skin:  Skin Assessment: Skin Integrity Issues: Skin Integrity Issues:: Other (Comment) Other: skin tears  Last BM:  7/28 type 6  Height:   Ht Readings from Last 1 Encounters:  10/09/22 5\' 9"  (1.753 m)    Weight:   Wt Readings from Last 1 Encounters:  10/20/22 75.7 kg     BMI:  Body mass index is 24.65 kg/m.  Estimated Nutritional Needs:   Kcal:  2200-2400 kcals  Protein:  120-140 g  Fluid:  1L plus UOP  Leodis Rains, RDN, LDN  Clinical Nutrition

## 2022-10-21 NOTE — Plan of Care (Signed)

## 2022-10-22 NOTE — Progress Notes (Signed)
CSW received consult for patient. CSW spoke with patient at bedside. Patient requested resources for medicaid. CSW provided patient with resources for medicaid. All questions answered. No further questions reported at this time.

## 2022-10-22 NOTE — Progress Notes (Signed)
PROGRESS NOTE    Ryan [name]  ZDG:[id number] DOB: May 08, 1960 DOA: 10/09/2022 PCP: Sharin Grave, MD    Brief Narrative:   62 year old male with history of hypertension, hyperlipidemia, ESRD on HD was admitted with new onset atrial fibrillation with rapid ventricular rate. Patient initially presented on 08/28/2022 with low back pain, found to have evidence of L5-S1 discitis and osteomyelitis, MSSA bacteremia with mitral valve endocarditis. CT of the chest showed septic emboli with a new airspace opacities in the left upper lobe. MRI of the brain showed scattered subcentimeter infarcts involving the bilateral cerebral hemisphere and right cerebellum. CT surgery  was consulted, recommended conservative management.  Patient was recommended to continue with cefazolin until 11/22/2022 per ID recommendations. Patient also had small bowel obstruction during that hospital stay, was discharged to rehab on 09/25/2022. Patient started to experience cough on 10/02/2022, diagnosed with a pneumonia/aspiration pneumonia, was treated with 5 days of IV Zosyn. Patient was noted to be fluid overloaded, being managed at the hemodialysis. Patient also had dysphagia, drop in hemoglobin to 6.1 with a stool occult being positive. Patient was seen by GI, esophagram performed which was negative for any acute pathology. On 10/09/2022 developed atrial fibrillation  with rapid ventricular rate, and was transferred to [facility]. EKG showed first-degree AV block, sinus tachycardia. Patient was found to be in any respiratory distress due to combination of, pneumonia. Patient was started on Zosyn, transition to cefazolin. Patient developed significant respiratory failure, transferred to ICU, underwent CRRT. Patient lost 17 pounds since admission.    At this time, plan is disposition to CIR. pending placement.  Assessment and plan  Acute hypoxic respiratory failure Resolved.  Secondary to fluid overload, pneumonia.Continue with  cefazolin until 11/04/2022. On room air today, was on nasal canula 1 litre yesterday.   Pneumonia secondary to septic emboli due to endocarditis On cefazolin, continue DuoNebs, improved.   Chronic hypotension On reduced  dose of metoprolol and midodrine. Asymptomatic.   ESRD on HD -Continue with the hemodialysis as per schedule. Nephrology on board.  Paroxysmal atrial fibrillation with rapid ventricular rate Initially required Cardizem drip.  Currently rate  is largely controlled with metoprolol.  Dysphagia Evaluated by speech therapy, on dysphagia III diet with thin liquids.   Anemia of chronic disease, Chronic kidney disease -Closely monitor CBC, transfuse for hemoglobin less than 7.  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 MSSA bacteremia. On cefazolin   L5-S1 discitis with osteomyelitis Continue cefazolin until 11/04/2022   Nutrition Status: Nutrition Problem: Moderate Malnutrition Etiology: chronic illness (worsening with acute illness) Signs/Symptoms: moderate muscle depletion, moderate fat depletion, energy intake < 75% for > or equal to 1 month Interventions: Refer to RD note for recommendations     DVT prophylaxis: heparin injection 5,000 Units Start: 10/09/22 1700   Code Status:     Code Status: Full Code  Disposition: CIR as per PT recommendation   Status is: Inpatient  Remains inpatient appropriate because: IV cefazolin, need for CIR, hemodialysis   Family Communication: None at bedside  Consultants:  Nephrology Cardiology  Procedures:   hemodialysis  Antimicrobials:  Cefazolin until 8/31   Subjective: Today, patient was seen and examined at bedside.  Low blood pressure reported but  asymptomatic.  Denies any interval complaints.  Has been weaned off oxygen.  No nausea vomiting abdominal pain.  Denies overt shortness of breath or dyspnea.    Objective: Vitals:    10/22/22  0430 10/22/22 0820 10/22/22 0824 10/22/22 0953  BP: (!) 92/59   (!) 82/60  Pulse: 67   (!) 101  Resp: 18   18  Temp: 98 F (36.7 C)   97.6 F (36.4 C)  TempSrc: Oral   Oral  SpO2: 95% 94% 96% 96%  Weight:      Height:        Intake/Output Summary (Last 24 hours) at 10/22/2022 1053 Last data filed at 10/22/2022 0520 Gross per 24 hour  Intake 225 ml  Output 3100 ml  Net -2875 ml   Filed Weights   10/20/22 0814  Weight: 75.7 kg    Physical Examination: Body mass index is 24.65 kg/m.   General:  Average built, not in obvious distress, on room air HENT:   No scleral pallor or icterus noted. Oral mucosa is moist.  Chest:   Diminished breath sounds bilaterally.   Left chest wall hemodialysis catheter in place. CVS: S1 &S2 heard. No murmur.  Regular rate and rhythm. Abdomen: Soft, nontender, nondistended.  Bowel sounds are heard.   Extremities: No cyanosis, clubbing or edema.  Psych: Alert, awake and oriented, normal mood CNS:  No cranial nerve deficits.  Moves all extremities Skin: Warm and dry.    Data Reviewed:   CBC: Recent Labs  Lab 10/16/22 0008 10/17/22 0156 10/18/22 0143 10/19/22 0035  WBC 14.4* 15.4* 12.5* 11.6*  HGB 9.6* 9.4* 9.1* 9.2*  HCT 32.4* 31.7* 29.7* 30.5*  MCV 85.3 85.7 83.7 82.7  PLT 245 252 218 227    Basic Metabolic Panel: Recent Labs  Lab 10/16/22 0008 10/17/22 0717 10/18/22 1141 10/21/22 2318  NA 130* 128* 132* 129*  K 4.0 4.7 4.6 3.7  CL 95* 93* 92* 92*  CO2 22 19* 21* 24  GLUCOSE 125* 107* 119* 110*  BUN 34* 61* 44* 29*  CREATININE 3.53* 5.82* 4.78* 3.63*  CALCIUM 8.8* 8.9 9.2 9.0  MG 2.5*  --   --  1.8  PHOS 2.4*  --   --   --     Liver Function Tests: No results for input(s): "AST", "ALT", "ALKPHOS", "BILITOT", "PROT", "ALBUMIN" in the last 168 hours.    Radiology Studies: No results found.    LOS: 13 days    Joycelyn Das, MD [facility] Available via Epic secure chat 7am-7pm After these  hours, please refer to coverage provider listed on [email or web address] 10/22/2022, 10:53 AM

## 2022-10-22 NOTE — Evaluation (Signed)
Occupational Therapy Evaluation Patient Details Name: Ryan [name] MRN: [id number] DOB: 29-Sep-1960 Today's Date: 10/22/2022   History of Present Illness 62 yo male presents to [facility] from CIR on 7/18 for afib RVR, aspiration PNA. RR on 7/21 for acute respiratory distress due to pulmonary edema requiring bipap and CRRT, and transfer to ICU. 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 S/p  HD cath L subclavian on 7/1. PMH includes HTN, HLD, ESRD on HD TTS.   Clinical Impression   At his baseline, pt lives independently. 64 year old son plans to move in when he returns home. Pt presents with generalized weakness, decreased activity tolerance, hypotension and poor sitting balance. Pt requires up to min assist for mobility and set up to min assist with increased time due to low activity tolerance. Pt is likely to return to modified independence with intensive rehab >3 hours/day.       Recommendations for follow up therapy are one component of a multi-disciplinary discharge planning process, led by the attending physician.  Recommendations may be updated based on patient status, additional functional criteria and insurance authorization.   Assistance Recommended at Discharge Frequent or constant Supervision/Assistance  Patient can return home with the following Assistance with cooking/housework;Help with stairs or ramp for entrance;Assist for transportation;Direct supervision/assist for medications management;Direct supervision/assist for financial management;A little help with walking and/or transfers;A little help with bathing/dressing/bathroom    Functional Status Assessment     Equipment Recommendations  Tub/shower bench;BSC/3in1    Recommendations for Other Services       Precautions / Restrictions Precautions Precautions: Fall Precaution Comments: watch for  hypotension Required Braces or Orthoses: Spinal Brace Spinal Brace: Lumbar corset;Other (comment) Spinal Brace Comments: for comfort Restrictions Weight Bearing Restrictions: No      Mobility Bed Mobility               General bed mobility comments: up in recliner    Transfers Overall transfer level: Needs assistance Equipment used: Rolling walker (2 wheels) Transfers: Sit to/from Stand Sit to Stand: Min assist     Step pivot transfers: Min assist     General transfer comment: cues for hand placement, min assist to steady      Balance Overall balance assessment: Needs assistance   Sitting balance-Leahy Scale: Good       Standing balance-Leahy Scale: Poor                             ADL either performed or assessed with clinical judgement   ADL Overall ADL's : Needs assistance/impaired     Grooming: Set up;Sitting;Wash/dry hands;Wash/dry face;Oral care           Upper Body Dressing : Set up;Sitting   Lower Body Dressing: Set up;Sitting/lateral leans Lower Body Dressing Details (indicate cue type and reason): abel to cross foot over opposite knee to don and doff socks Toilet Transfer: Minimal assistance;BSC/3in1;Rolling walker (2 wheels)   Toileting- Clothing Manipulation and Hygiene: Set up;Sitting/lateral lean         General ADL Comments: Pt with decreased activity tolerance.     Vision Baseline Vision/History: 1 Wears glasses Patient Visual Report: No change from baseline Additional Comments: readers     Perception     Praxis      Pertinent Vitals/Pain Pain Assessment Pain Assessment: Faces Faces Pain Scale: Hurts a little bit Pain  Location: buttocks Pain Descriptors / Indicators: Discomfort Pain Intervention(s): Repositioned     Hand Dominance Right   Extremity/Trunk Assessment Upper Extremity Assessment Upper Extremity Assessment: RUE deficits/detail RUE Deficits / Details: FF to 90 degrees due to rotator cuff  tear, generalized weakness LUE Deficits / Details: generalized weakness   Lower Extremity Assessment Lower Extremity Assessment: Defer to PT evaluation   Cervical / Trunk Assessment Cervical / Trunk Assessment: Kyphotic   Communication Communication Communication: No difficulties   Cognition Arousal/Alertness: Awake/alert Behavior During Therapy: WFL for tasks assessed/performed Overall Cognitive Status: Within Functional Limits for tasks assessed                                       General Comments       Exercises     Shoulder Instructions      Home Living Family/patient expects to be discharged to:: Private residence Living Arrangements: Children Available Help at Discharge: Family;Available 24 hours/day Type of Home: House Home Access: Stairs to enter [employer] of Steps: 2 curb steps - 1 from sidewalk leading to porch then 1 STE going in from [employer]: None Home Layout: Two level;Bed/bath upstairs;1/2 bath on main level Alternate Level Stairs-Number of Steps: flight   Bathroom Shower/Tub: Chief Strategy Officer: Standard     Home Equipment: None      Lives With: Alone    Prior Functioning/Environment Prior Level of Function : Independent/Modified Independent;Driving             Mobility Comments: Previously Independent without an AD, at CIR walking with a rollator and using w/c for further distance ADLs Comments: Pt reports doing basic self care (washing face, brushing teeth), staff and family assisting with bathing, shaving since CIR admission        OT Problem List:        OT Treatment/Interventions:      OT Goals(Current goals can be found in the care plan section) Acute Rehab OT Goals OT Goal Formulation: With patient Time For Goal Achievement: 11/05/22 Potential to Achieve Goals: Good  OT Frequency: Min 1X/week    Co-evaluation              AM-PAC OT "6 Clicks" Daily  Activity     Outcome Measure Help from another person eating meals?: None Help from another person taking care of personal grooming?: A Little Help from another person toileting, which includes using toliet, bedpan, or urinal?: A Little Help from another person bathing (including washing, rinsing, drying)?: A Little Help from another person to put on and taking off regular upper body clothing?: A Little Help from another person to put on and taking off regular lower body clothing?: A Little 6 Click Score: 19   End of Session Equipment Utilized During Treatment: Rolling walker (2 wheels);Gait belt Nurse Communication: Other (comment) (aware of BP)  Activity Tolerance: Patient limited by fatigue Patient left: in chair;with call bell/phone within reach  OT Visit Diagnosis: Unsteadiness on feet (R26.81);Muscle weakness (generalized) (M62.81);Pain                Time: 1015-1040 OT Time Calculation (min): 25 min Charges:  OT General Charges $OT Visit: 1 Visit OT Evaluation $OT Eval Moderate Complexity: 1 Mod OT Treatments $Self Care/Home Management : 8-22 mins  Berna Spare, OTR/L Acute Rehabilitation Services Office: [phone]  Evern Bio 10/22/2022, 10:49 AM

## 2022-10-22 NOTE — Plan of Care (Signed)
  Problem: Clinical Measurements: Goal: Ability to maintain clinical measurements within normal limits will improve Outcome: Progressing   Problem: Activity: Goal: Risk for activity intolerance will decrease Outcome: Progressing   Problem: Nutrition: Goal: Adequate nutrition will be maintained Outcome: Progressing   Problem: Elimination: Goal: Will not experience complications related to bowel motility Outcome: Progressing   Problem: Safety: Goal: Ability to remain free from injury will improve Outcome: Progressing   Problem: Skin Integrity: Goal: Risk for impaired skin integrity will decrease Outcome: Progressing   

## 2022-10-22 NOTE — Progress Notes (Signed)
Physical Therapy Treatment Patient Details Name: YE ROCCHI MRN: [id number] DOB: 1961/03/15 Today's Date: 10/22/2022   History of Present Illness 62 yo male presents to [facility] from CIR on 7/18 for afib RVR, aspiration PNA. RR on 7/21 for acute respiratory distress due to pulmonary edema requiring bipap and CRRT, and transfer to ICU. 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 S/p  HD cath L subclavian on 7/1. PMH includes HTN, HLD, ESRD on HD TTS.    PT Comments  Pt making steady progress. Overall pt feels fatigued after 4 straight days of HD but mobility improving. Patient will benefit from intensive inpatient follow up therapy, >3 hours/day to return to independent level.      If plan is discharge home, recommend the following: Help with stairs or ramp for entrance;Assist for transportation;Assistance with cooking/housework;A lot of help with walking and/or transfers;A lot of help with bathing/dressing/bathroom   Can travel by private vehicle        Equipment Recommendations  Rollator (4 wheels)    Recommendations for Other Services       Precautions / Restrictions Precautions Precautions: Fall Precaution Comments: watch for hypotension Required Braces or Orthoses: Spinal Brace Spinal Brace: Lumbar corset;Other (comment) Spinal Brace Comments: for comfort Restrictions Weight Bearing Restrictions: No     Mobility  Bed Mobility               General bed mobility comments: up in recliner    Transfers Overall transfer level: Needs assistance Equipment used: Rollator (4 wheels) Transfers: Sit to/from Stand Sit to Stand: Min assist   Step pivot transfers: Min assist       General transfer comment: Assist to steady, Verbal cues for hand placement    Ambulation/Gait Ambulation/Gait assistance: Min assist Gait Distance (Feet): 35 Feet (x  2) Assistive device: Rollator (4 wheels) Gait Pattern/deviations: Step-through pattern, Trunk flexed Gait velocity: decr Gait velocity interpretation: <1.31 ft/sec, indicative of household ambulator   General Gait Details: Assist for balance and support   Stairs             Wheelchair Mobility     Tilt Bed    Modified Rankin (Stroke Patients Only)       Balance Overall balance assessment: Needs assistance Sitting-balance support: No upper extremity supported, Feet supported Sitting balance-Leahy Scale: Good     Standing balance support: Single extremity supported, Bilateral upper extremity supported, Reliant on assistive device for balance Standing balance-Leahy Scale: Poor Standing balance comment: UE support and min guard for static standing                            Cognition Arousal/Alertness: Awake/alert Behavior During Therapy: WFL for tasks assessed/performed Overall Cognitive Status: Within Functional Limits for tasks assessed                                          Exercises Other Exercises Other Exercises: Repeated sit to stand x 3    General Comments General comments (skin integrity, edema, etc.): BP soft 89/58 sitting, standing 95/64      Pertinent Vitals/Pain Pain Assessment Pain Assessment: Faces Faces Pain Scale: Hurts a little bit Pain Location: buttocks and back Pain Descriptors / Indicators: Discomfort Pain Intervention(s): Repositioned    Home  Living Family/patient expects to be discharged to:: Private residence Living Arrangements: Children Available Help at Discharge: Family;Available 24 hours/day Type of Home: House Home Access: Stairs to enter Entrance Stairs-Rails: None Entrance Stairs-Number of Steps: 2 curb steps - 1 from sidewalk leading to porch then 1 STE going in from porch Alternate Level Stairs-Number of Steps: flight Home Layout: Two level;Bed/bath upstairs;1/2 bath on main level Home  Equipment: None      Prior Function            PT Goals (current goals can now be found in the care plan section) Acute Rehab PT Goals Patient Stated Goal: back to rehab to get stronger Progress towards PT goals: Progressing toward goals    Frequency    Min 1X/week      PT Plan Current plan remains appropriate    Co-evaluation              AM-PAC PT "6 Clicks" Mobility   Outcome Measure  Help needed turning from your back to your side while in a flat bed without using bedrails?: A Little Help needed moving from lying on your back to sitting on the side of a flat bed without using bedrails?: A Little Help needed moving to and from a bed to a chair (including a wheelchair)?: A Little Help needed standing up from a chair using your arms (e.g., wheelchair or bedside chair)?: A Little Help needed to walk in hospital room?: A Little Help needed climbing 3-5 steps with a railing? : Total 6 Click Score: 16    End of Session Equipment Utilized During Treatment: Gait belt Activity Tolerance: Patient tolerated treatment well Patient left: in chair;with call bell/phone within reach;Other (comment)   PT Visit Diagnosis: Other abnormalities of gait and mobility (R26.89);Muscle weakness (generalized) (M62.81) Pain - part of body:  (back)     Time: 9562-1308 PT Time Calculation (min) (ACUTE ONLY): 22 min  Charges:    $Gait Training: 8-22 mins PT General Charges $$ ACUTE PT VISIT: 1 Visit                     [facility] PT Acute Rehabilitation Services Office [phone]    Angelina Ok [facility] 10/22/2022, 12:04 PM

## 2022-10-22 NOTE — Progress Notes (Signed)
Subjective: Seen in room, mild cough,on RA now.  UF 3.1L yesterday with HD.  Clotted machine so took extra long for set up o/w ok.   Objective Vital signs in last 24 hours: Vitals:   10/22/22 0400 10/22/22 0430 10/22/22 0820 10/22/22 0824  BP:  (!) 92/59    Pulse:  67    Resp:  18    Temp:  98 F (36.7 C)    TempSrc:  Oral    SpO2: 95% 95% 94% 96%  Weight:      Height:       Weight change:   Physical Exam: General: Chronically ill male NAD Heart: Ireg, irreg  Lungs: CTA, nonlabored breathing ,RA Abdomen: NABS, soft nontender no ascites nondistended Extremities: no edema Dialysis Access: TDC    OP Dialysis Orders: TTS at [facility] 3:45hr, 450/A1.5, EDW 85.7kg, 2K/2Ca bath, TDC, heparin 3000 unit bolus - no ESA - Calcitriol 1.67mcg PO q HD   Problem/Plan: MSSA bacteremia/endocarditis/L5-S1 discitis: Blood Cx 6/16 MSSA, negative Cx on 6/17, 6/19. S/p TDC line holiday. TTE was concerning for endocarditis. TEE cancelled d/t high risk factors. LS MRI with possible L5-S1 discitis. On IV Cefazolin 2g q HD x 6 weeks (until 11/04/22). BP/Volume overload: Hypotension preventing adequate UF. Marland Kitchen Required ICU transfer for CRRT 7/21 - 10/15/22, able to get down to ~ 74kg range. Weight back up to 79kg on 7/26 - dialyzed but unable to get any fluid off due to hypotension and intermittent tachycardia. No effect with IV albumin and midodrine. Metoprolol dose reduced from 50mg  TID -> 25mg  TID. Fluid restrictions disc  now tolerating UF with treatments, next Tomorrow per usual schedule - increase heparin given clotting, bring in chair per his request Deconditioning /weakness: was in CIR, was transferred back to 6E due to  medical complications ESRD: . Usual TTS schedule, see abv Access: TDC pulled 6/18 for line holiday, then new LIJ TDC placed 7/01 per IR. For perm access: s/p L AVF ligation in 2019. Last seen by Dr. Edilia Bo 01/2022 - limited mobility in R arm so planning for L arm graft but had not scheduled  surgery yet. Can revisit as an outpatient once bacteremia resolved.  Anemia (ESRD + ABLA, + FOBT): Hgb 9s - stable for now. Continue Aranesp q Thursday.   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 Hold VDRA. Nutrition - Renal diet, Alb low - continue supplement   Estill Bakes MD [facility] Pager [phone]   Labs: Basic Metabolic Panel: Recent Labs  Lab 10/16/22 0008 10/17/22 0717 10/18/22 1141 10/21/22 2318  NA 130* 128* 132* 129*  K 4.0 4.7 4.6 3.7  CL 95* 93* 92* 92*  CO2 22 19* 21* 24  GLUCOSE 125* 107* 119* 110*  BUN 34* 61* 44* 29*  CREATININE 3.53* 5.82* 4.78* 3.63*  CALCIUM 8.8* 8.9 9.2 9.0  PHOS 2.4*  --   --   --    Liver Function Tests: No results for input(s): "AST", "ALT", "ALKPHOS", "BILITOT", "PROT", "ALBUMIN" in the last 168 hours.  No results for input(s): "LIPASE", "AMYLASE" in the last 168 hours. No results for input(s): "AMMONIA" in the last 168 hours. CBC: Recent Labs  Lab 10/16/22 0008 10/17/22 0156 10/18/22 0143 10/19/22 0035  WBC 14.4* 15.4* 12.5* 11.6*  HGB 9.6* 9.4* 9.1* 9.2*  HCT 32.4* 31.7* 29.7* 30.5*  MCV 85.3 85.7 83.7 82.7  PLT 245 252 218 227   Cardiac Enzymes: No results for input(s): "CKTOTAL", "CKMB", "CKMBINDEX", "  TROPONINI" in the last 168 hours. CBG: No results for input(s): "GLUCAP" in the last 168 hours.   Studies/Results: No results found. Medications:  sodium chloride 10 mL/hr at 10/18/22 2141    ceFAZolin (ANCEF) IV 2 g (10/21/22 1858)   lactated ringers      (feeding supplement) PROSource Plus  30 mL Oral TID WC   arformoterol  15 mcg Nebulization BID   Chlorhexidine Gluconate Cloth  6 each Topical Q0600   Darbepoetin Alfa  150 mcg Subcutaneous Q Thu-1800   feeding supplement (NEPRO CARB STEADY)  237 mL Oral TID BM   heparin  5,000 Units Subcutaneous Q8H   metoprolol tartrate  25 mg Oral Q8H   midodrine  10 mg Oral TID WC   multivitamin  1 tablet Oral QHS   mouth rinse  15 mL  Mouth Rinse 4 times per day   pantoprazole  40 mg Oral Daily   revefenacin  175 mcg Nebulization Daily

## 2022-10-22 NOTE — Plan of Care (Signed)
  Problem: Education: Goal: Knowledge of General Education information will improve Description: Including pain rating scale, medication(s)/side effects and non-pharmacologic comfort measures Outcome: Progressing   Problem: Health Behavior/Discharge Planning: Goal: Ability to manage health-related needs will improve Outcome: Progressing   Problem: Clinical Measurements: Goal: Respiratory complications will improve Outcome: Progressing   Problem: Clinical Measurements: Goal: Cardiovascular complication will be avoided Outcome: Progressing   Problem: Activity: Goal: Risk for activity intolerance will decrease Outcome: Progressing   Problem: Coping: Goal: Level of anxiety will decrease Outcome: Progressing   Problem: Pain Managment: Goal: General experience of comfort will improve Outcome: Progressing   Problem: Safety: Goal: Ability to remain free from injury will improve Outcome: Progressing   Problem: Skin Integrity: Goal: Risk for impaired skin integrity will decrease Outcome: Progressing

## 2022-10-22 NOTE — Progress Notes (Signed)
Inpatient Rehab Admissions Coordinator:   Will open insurance auth today for potential return to CIR.    Estill Dooms, PT, DPT Admissions Coordinator [phone] 10/22/22  3:34 PM

## 2022-10-23 ENCOUNTER — Inpatient Hospital Stay (HOSPITAL_COMMUNITY): Payer: No Typology Code available for payment source

## 2022-10-23 DIAGNOSIS — N186 End stage renal disease: Secondary | ICD-10-CM | POA: Diagnosis not present

## 2022-10-23 DIAGNOSIS — I39 Endocarditis and heart valve disorders in diseases classified elsewhere: Secondary | ICD-10-CM | POA: Diagnosis not present

## 2022-10-23 DIAGNOSIS — R918 Other nonspecific abnormal finding of lung field: Secondary | ICD-10-CM | POA: Diagnosis not present

## 2022-10-23 DIAGNOSIS — R140 Abdominal distension (gaseous): Secondary | ICD-10-CM | POA: Diagnosis not present

## 2022-10-23 DIAGNOSIS — R195 Other fecal abnormalities: Secondary | ICD-10-CM | POA: Diagnosis not present

## 2022-10-23 DIAGNOSIS — R0989 Other specified symptoms and signs involving the circulatory and respiratory systems: Secondary | ICD-10-CM | POA: Diagnosis not present

## 2022-10-23 DIAGNOSIS — I10 Essential (primary) hypertension: Secondary | ICD-10-CM | POA: Diagnosis not present

## 2022-10-23 DIAGNOSIS — I120 Hypertensive chronic kidney disease with stage 5 chronic kidney disease or end stage renal disease: Secondary | ICD-10-CM | POA: Diagnosis not present

## 2022-10-23 DIAGNOSIS — M4626 Osteomyelitis of vertebra, lumbar region: Secondary | ICD-10-CM | POA: Diagnosis not present

## 2022-10-23 DIAGNOSIS — E785 Hyperlipidemia, unspecified: Secondary | ICD-10-CM | POA: Diagnosis not present

## 2022-10-23 DIAGNOSIS — E440 Moderate protein-calorie malnutrition: Secondary | ICD-10-CM

## 2022-10-23 DIAGNOSIS — B9561 Methicillin susceptible Staphylococcus aureus infection as the cause of diseases classified elsewhere: Secondary | ICD-10-CM | POA: Diagnosis not present

## 2022-10-23 DIAGNOSIS — R7881 Bacteremia: Secondary | ICD-10-CM | POA: Diagnosis not present

## 2022-10-23 DIAGNOSIS — E854 Organ-limited amyloidosis: Secondary | ICD-10-CM | POA: Diagnosis not present

## 2022-10-23 DIAGNOSIS — I4891 Unspecified atrial fibrillation: Secondary | ICD-10-CM | POA: Diagnosis not present

## 2022-10-23 DIAGNOSIS — D631 Anemia in chronic kidney disease: Secondary | ICD-10-CM | POA: Diagnosis not present

## 2022-10-23 DIAGNOSIS — N250 Renal osteodystrophy: Secondary | ICD-10-CM | POA: Diagnosis not present

## 2022-10-23 DIAGNOSIS — Z992 Dependence on renal dialysis: Secondary | ICD-10-CM | POA: Diagnosis not present

## 2022-10-23 DIAGNOSIS — J9601 Acute respiratory failure with hypoxia: Secondary | ICD-10-CM | POA: Diagnosis not present

## 2022-10-23 DIAGNOSIS — D638 Anemia in other chronic diseases classified elsewhere: Secondary | ICD-10-CM | POA: Diagnosis not present

## 2022-10-23 DIAGNOSIS — J811 Chronic pulmonary edema: Secondary | ICD-10-CM | POA: Diagnosis not present

## 2022-10-23 MED ORDER — HEPARIN SODIUM (PORCINE) 1000 UNIT/ML IJ SOLN
INTRAMUSCULAR | Status: AC
  Administered 2022-10-23: 2000 [IU]
  Filled 2022-10-23: qty 4

## 2022-10-23 MED ORDER — HEPARIN SODIUM (PORCINE) 1000 UNIT/ML IJ SOLN
INTRAMUSCULAR | Status: AC
  Administered 2022-10-23: 4000 [IU]
  Filled 2022-10-23: qty 2

## 2022-10-23 MED ORDER — HEPARIN SODIUM (PORCINE) 1000 UNIT/ML IJ SOLN
INTRAMUSCULAR | Status: AC
  Administered 2022-10-23: 1000 [IU]
  Filled 2022-10-23: qty 4

## 2022-10-23 NOTE — Progress Notes (Signed)
Asleep. Sp02 = 82%, sustaining. HR = 100's-110's bpm. Hooked to oxygen at 2lpm via [place]. Spo2 = 95%. Plan of care ongoing.

## 2022-10-23 NOTE — Progress Notes (Signed)
Subjective:  seen in Hemodialysis room, No complaints except mild cough no shortness of breath.  Systolics in 80s we will do UF as tolerated.  Midodrine just given before HD.  No requiring O2 at night, will check chest x-ray after HD  Objective Vital signs in last 24 hours: Vitals:   10/23/22 0523 10/23/22 0623 10/23/22 0625 10/23/22 0734  BP: 96/62 98/64 109/83   Pulse: (!) 116 99 65   Resp: 20 (!) 21 (!) 22   Temp:      TempSrc:      SpO2:    97%  Weight:      Height:       Weight change:   Physical Exam: General: Chronically ill male NAD Heart: Ireg, irreg  hrt rt stable now  Lungs: CTA, nonlabored breathing , 2 l [place]  Abdomen: NABS, soft nontender no ascites nondistended Extremities: no edema Dialysis Access: TDC     OP Dialysis Orders: TTS at [facility] 3:45hr, 450/A1.5, EDW 85.7kg, 2K/2Ca bath, TDC, heparin 3000 unit bolus - no ESA - Calcitriol 1.13mcg PO q HD     Problem/Plan: MSSA bacteremia/endocarditis/L5-S1 discitis: Blood Cx 6/16 MSSA, negative Cx on 6/17, 6/19. S/p TDC line holiday. TTE was concerning for endocarditis. TEE cancelled d/t high risk factors. LS MRI with possible L5-S1 discitis. On IV Cefazolin 2g q HD x 6 weeks (until 11/04/22). BP/Volume overload: Hypotension preventing adequate UF. Marland Kitchen Required ICU transfer for CRRT 7/21 - 10/15/22, able to get down to ~ 74kg range. Weight back up to 79kg on 7/26 - dialyzed but unable to get any fluid off due to hypotension and intermittent tachycardia. No effect with IV albumin and midodrine. Metoprolol dose reduced from 50mg  TID -> 25mg  TID. Fluid restrictions disc  now tolerating UF with treatments, next Todayper usual schedule - increase heparin given clotting, bring in chair per his request ck cxr after hd  Deconditioning /weakness: was in CIR, was transferred back to 6E due to  medical complications ESRD: . Usual TTS schedule, see abv Access: TDC pulled 6/18 for line holiday, then new LIJ TDC placed 7/01 per IR. For perm  access: s/p L AVF ligation in 2019. Last seen by Dr. Edilia Bo 01/2022 - limited mobility in R arm so planning for L arm graft but had not scheduled surgery yet. Can revisit as an outpatient once bacteremia resolved.  Anemia (ESRD + ABLA, + FOBT): Hgb 10.0  - stable for now. Continue Aranesp q Thursday.   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 Hold VDRA. Nutrition - Renal diet, Alb low - continue supplement   Lenny Pastel, PA-C [facility] Beeper [phone] 10/23/2022,8:02 AM  LOS: 14 days   Labs: Basic Metabolic Panel: Recent Labs  Lab 10/18/22 1141 10/21/22 2318 10/23/22 0043  NA 132* 129* 126*  K 4.6 3.7 4.0  CL 92* 92* 86*  CO2 21* 24 21*  GLUCOSE 119* 110* 110*  BUN 44* 29* 57*  CREATININE 4.78* 3.63* 5.43*  CALCIUM 9.2 9.0 9.1   Liver Function Tests: No results for input(s): "AST", "ALT", "ALKPHOS", "BILITOT", "PROT", "ALBUMIN" in the last 168 hours. No results for input(s): "LIPASE", "AMYLASE" in the last 168 hours. No results for input(s): "AMMONIA" in the last 168 hours. CBC: Recent Labs  Lab 10/17/22 0156 10/18/22 0143 10/19/22 0035 10/23/22 0043  WBC 15.4* 12.5* 11.6* 13.5*  HGB 9.4* 9.1* 9.2* 10.0*  HCT 31.7* 29.7* 30.5* 33.2*  MCV 85.7 83.7 82.7 85.1  PLT  252 218 227 227   Cardiac Enzymes: No results for input(s): "CKTOTAL", "CKMB", "CKMBINDEX", "TROPONINI" in the last 168 hours. CBG: No results for input(s): "GLUCAP" in the last 168 hours.  Studies/Results: No results found. Medications:  sodium chloride 10 mL/hr at 10/18/22 2141    ceFAZolin (ANCEF) IV 2 g (10/21/22 1858)   lactated ringers      (feeding supplement) PROSource Plus  30 mL Oral TID WC   arformoterol  15 mcg Nebulization BID   Chlorhexidine Gluconate Cloth  6 each Topical Q0600   Darbepoetin Alfa  150 mcg Subcutaneous Q Thu-1800   feeding supplement (NEPRO CARB STEADY)  237 mL Oral TID BM   heparin  5,000 Units Subcutaneous Q8H   heparin sodium  (porcine)       heparin sodium (porcine)       metoprolol tartrate  25 mg Oral Q8H   midodrine  10 mg Oral TID WC   multivitamin  1 tablet Oral QHS   mouth rinse  15 mL Mouth Rinse 4 times per day   pantoprazole  40 mg Oral Daily   revefenacin  175 mcg Nebulization Daily

## 2022-10-23 NOTE — Plan of Care (Signed)

## 2022-10-23 NOTE — Progress Notes (Signed)
Inpatient Rehab Admissions Coordinator:   I did received approval for CIR admit from [facility]. I do not have a bed available for this patient to admit today.  Note MEWS yellow/red this afternoon.  Will f/u in the AM to see how he's doing.   Estill Dooms, PT, DPT Admissions Coordinator [phone] 10/23/22  3:18 PM

## 2022-10-23 NOTE — Progress Notes (Signed)
Patient's BP 69/50, confirmed with manual BP cuff. Patient is asymptomatic. This RN administered patient's midodrine and made Dr. Tyson Babinski aware. Patient's current BP is 76/51.

## 2022-10-23 NOTE — Progress Notes (Signed)
   10/23/22 1251  Vitals  Temp 98.1 F (36.7 C)  Pulse Rate (!) 101  Resp (!) 21  BP 94/86  SpO2 98 %  O2 Device Nasal Cannula  Oxygen Therapy  O2 Flow Rate (L/min) 2 L/min  Patient Activity (if Appropriate) In chair  Pulse Oximetry Type Continuous  Post Treatment  Dialyzer Clearance Clear  Duration of HD Treatment -hour(s) 3.75 hour(s)  Hemodialysis Intake (mL) 0 mL  Liters Processed 78.9  Fluid Removed (mL) 3 mL  Tolerated HD Treatment Yes  Post-Hemodialysis Comments Pt. voiced no complaints. VSS, Admin medication per. order. Call Report to 6E RN Exie Parody.   Received patient in bed to unit.  Alert and oriented.  Informed consent signed and in chart.   TX duration:3.75  Patient tolerated well.  Transported back to the room  Alert, without acute distress.  Hand-off given to patient's nurse.   Access used: Yes Access issues: No  Total UF removed: 3 L Medication(s) given: See MAR Post HD VS: See Above Grid Post HD weight: No   [name]

## 2022-10-23 NOTE — Progress Notes (Signed)
OT Cancellation Note  Patient Details Name: Ryan [name] MRN: [id number] DOB: [birth year]   Cancelled Treatment:    Reason Eval/Treat Not Completed: Patient at procedure or test/ unavailable (HD)  Evern Bio 10/23/2022, 12:20 PM Berna Spare, OTR/L Acute Rehabilitation Services Office: [phone]

## 2022-10-23 NOTE — Progress Notes (Signed)
PROGRESS NOTE    Ryan [name]  GEX:[id number] DOB: [birth year] DOA: 10/09/2022 PCP: Sharin Grave, MD    Brief Narrative:   63 year old male with history of hypertension, hyperlipidemia, ESRD on HD was admitted with new onset atrial fibrillation with rapid ventricular rate. Patient initially presented on 08/28/2022 with low back pain, found to have evidence of L5-S1 discitis and osteomyelitis, MSSA bacteremia with mitral valve endocarditis. CT of the chest showed septic emboli with a new airspace opacities in the left upper lobe. MRI of the brain showed scattered subcentimeter infarcts involving the bilateral cerebral hemisphere and right cerebellum. CT surgery  was consulted, recommended conservative management.  Patient was recommended to continue with cefazolin until 11/22/2022 per ID recommendations. Patient also had small bowel obstruction during that hospital stay, was discharged to rehab on 09/25/2022. Patient started to experience cough on 10/02/2022, diagnosed with a pneumonia/aspiration pneumonia, was treated with 5 days of IV Zosyn. Patient was noted to be fluid overloaded, being managed at the hemodialysis. Patient also had dysphagia, drop in hemoglobin to 6.1 with a stool occult being positive. Patient was seen by GI, esophagram performed which was negative for any acute pathology. On 10/09/2022 developed atrial fibrillation  with rapid ventricular rate, and was transferred to [facility]. EKG showed first-degree AV block, sinus tachycardia. Patient was found to be in any respiratory distress due to combination of, pneumonia. Patient was started on Zosyn, transition to cefazolin. Patient developed significant respiratory failure, transferred to ICU, underwent CRRT. Patient lost 17 pounds since admission.    At this time, plan is disposition to CIR.  Pending placement.  Assessment and plan  Acute hypoxic respiratory failure Resolved.  Secondary to fluid overload, pneumonia.Continue with  cefazolin until 11/04/2022. On nasal cannula oxygen at 2 L/min.   Pneumonia secondary to septic emboli due to endocarditis On cefazolin, continue DuoNebs, improved.   Chronic hypotension On reduced  dose of metoprolol and midodrine. Asymptomatic.  Latest blood pressure of 90/63.  Currently getting hemodialysis.   ESRD on HD -Continue with the hemodialysis as per schedule. Nephrology on board.  Paroxysmal atrial fibrillation with rapid ventricular rate Initially required Cardizem drip.  Currently rate  is largely controlled with metoprolol.  Dysphagia Evaluated by speech therapy, on dysphagia III diet with thin liquids.   Anemia of chronic disease, Chronic kidney disease -Closely monitor CBC, transfuse for hemoglobin less than 7.  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 MSSA bacteremia. On cefazolin   L5-S1 discitis with osteomyelitis Continue cefazolin until 11/04/2022   Nutrition Status: Nutrition Problem: Moderate Malnutrition Etiology: chronic illness (worsening with acute illness) Signs/Symptoms: moderate muscle depletion, moderate fat depletion, energy intake < 75% for > or equal to 1 month Interventions: Refer to RD note for recommendations     DVT prophylaxis: heparin injection 5,000 Units Start: 10/09/22 1700   Code Status:     Code Status: Full Code  Disposition: CIR as per PT recommendation   Status is: Inpatient  Remains inpatient appropriate because: IV cefazolin, need for CIR, hemodialysis   Family Communication: None at bedside  Consultants:  Nephrology Cardiology  Procedures:   hemodialysis  Antimicrobials:  Cefazolin until 8/31   Subjective: Today, patient was seen and examined during hemodialysis.  Denies interval complaints.  Denies any dizziness lightheadedness chest pain shortness of breath.   Objective: Vitals:   10/23/22 1000 10/23/22 1030 10/23/22 1101  10/23/22 1136  BP: 91/78 101/69 98/75 90/63  Pulse: 100 (!) 104 (!) 102 98  Resp: (!) 24 (!) 25 20   Temp:      TempSrc:      SpO2: 100% 98% 100% 99%  Weight:      Height:        Intake/Output Summary (Last 24 hours) at 10/23/2022 1154 Last data filed at 10/22/2022 2200 Gross per 24 hour  Intake 120 ml  Output --  Net 120 ml   Filed Weights   10/20/22 0814  Weight: 75.7 kg    Physical Examination: Body mass index is 24.65 kg/m.   General:  Average built, not in obvious distress, on room air HENT:   No scleral pallor or icterus noted. Oral mucosa is moist.  Chest:   Diminished breath sounds bilaterally.   Left chest wall hemodialysis catheter in place. CVS: S1 &S2 heard. No murmur.  Regular rate and rhythm. Abdomen: Soft, nontender, nondistended.  Bowel sounds are heard.   Extremities: No cyanosis, clubbing or edema.  Psych: Alert, awake and oriented, normal mood CNS:  No cranial nerve deficits.  Moves all extremities Skin: Warm and dry.    Data Reviewed:   CBC: Recent Labs  Lab 10/17/22 0156 10/18/22 0143 10/19/22 0035 10/23/22 0043  WBC 15.4* 12.5* 11.6* 13.5*  HGB 9.4* 9.1* 9.2* 10.0*  HCT 31.7* 29.7* 30.5* 33.2*  MCV 85.7 83.7 82.7 85.1  PLT 252 218 227 227    Basic Metabolic Panel: Recent Labs  Lab 10/17/22 0717 10/18/22 1141 10/21/22 2318 10/23/22 0043  NA 128* 132* 129* 126*  K 4.7 4.6 3.7 4.0  CL 93* 92* 92* 86*  CO2 19* 21* 24 21*  GLUCOSE 107* 119* 110* 110*  BUN 61* 44* 29* 57*  CREATININE 5.82* 4.78* 3.63* 5.43*  CALCIUM 8.9 9.2 9.0 9.1  MG  --   --  1.8 1.8    Liver Function Tests: No results for input(s): "AST", "ALT", "ALKPHOS", "BILITOT", "PROT", "ALBUMIN" in the last 168 hours.    Radiology Studies: No results found.    LOS: 14 days    Joycelyn Das, MD [facility] Available via Epic secure chat 7am-7pm After these hours, please refer to coverage provider listed on [email or web address] 10/23/2022, 11:54 AM

## 2022-10-23 NOTE — Progress Notes (Signed)
MEWS Progress Note  Patient Details Name: Ryan [name] MRN: [id number] DOB: October 25, 1960 Today's Date: 10/23/2022   MEWS Flowsheet Documentation:  Assess: MEWS Score Temp: (!) 97.5 F (36.4 C) BP: (!) 82/61 MAP (mmHg): 69 Pulse Rate: (!) 106 ECG Heart Rate: (!) 106 Resp: (!) 28 Level of Consciousness: Alert SpO2: 100 % O2 Device: Nasal Cannula Patient Activity (if Appropriate): In chair O2 Flow Rate (L/min): 2 L/min FiO2 (%): 24 % Assess: MEWS Score MEWS Temp: 0 MEWS Systolic: 1 MEWS Pulse: 1 MEWS RR: 2 MEWS LOC: 0 MEWS Score: 4 MEWS Score Color: Red Assess: SIRS CRITERIA SIRS Temperature : 0 SIRS Respirations : 1 SIRS Pulse: 1 SIRS WBC: 0 SIRS Score Sum : 2 SIRS Temperature : 0 SIRS Pulse: 1 SIRS Respirations : 1 SIRS WBC: 0 SIRS Score Sum : 2 Assess: if the MEWS score is Yellow or Red Were vital signs accurate and taken at a resting state?: Yes Does the patient meet 2 or more of the SIRS criteria?: No Does the patient have a confirmed or suspected source of infection?: Yes MEWS guidelines implemented : Yes, red Treat MEWS Interventions: Considered administering scheduled or prn medications/treatments as ordered Take Vital Signs Increase Vital Sign Frequency : Red: Q1hr x2, continue Q4hrs until patient remains green for 12hrs Escalate MEWS: Escalate: Red: Discuss with charge nurse and notify provider. Consider notifying RRT. If remains red for 2 hours consider need for higher level of care Notify: Charge Nurse/RN Name of Charge Nurse/RN Notified: Ryan Osier, RN Provider Notification Provider Name/Title: Dr. [name], Ryan [name] Date Provider Notified: 10/23/22 Time Provider Notified: 1350 Method of Notification: Page (Secure message) Notification Reason: Change in status      Ryan [name] 10/23/2022, 2:25 PM

## 2022-10-23 NOTE — Progress Notes (Signed)
PT Cancellation Note  Patient Details Name: Ryan [name] MRN: [id number] DOB: 1960/12/12   Cancelled Treatment:    Reason Eval/Treat Not Completed: Patient at procedure or test/unavailable - Hd, will check back as able.  Ryan [name], PT DPT Acute Rehabilitation Services Secure Chat Preferred  Office [phone]    Katura Eatherly Sheliah Plane 10/23/2022, 9:15 AM

## 2022-10-24 DIAGNOSIS — E785 Hyperlipidemia, unspecified: Secondary | ICD-10-CM | POA: Diagnosis not present

## 2022-10-24 DIAGNOSIS — I9589 Other hypotension: Secondary | ICD-10-CM | POA: Diagnosis not present

## 2022-10-24 DIAGNOSIS — I4719 Other supraventricular tachycardia: Secondary | ICD-10-CM | POA: Diagnosis not present

## 2022-10-24 DIAGNOSIS — T8612 Kidney transplant failure: Secondary | ICD-10-CM | POA: Diagnosis not present

## 2022-10-24 DIAGNOSIS — I76 Septic arterial embolism: Secondary | ICD-10-CM | POA: Diagnosis not present

## 2022-10-24 DIAGNOSIS — J9601 Acute respiratory failure with hypoxia: Secondary | ICD-10-CM | POA: Diagnosis not present

## 2022-10-24 DIAGNOSIS — R195 Other fecal abnormalities: Secondary | ICD-10-CM | POA: Diagnosis not present

## 2022-10-24 DIAGNOSIS — I10 Essential (primary) hypertension: Secondary | ICD-10-CM | POA: Diagnosis not present

## 2022-10-24 DIAGNOSIS — D638 Anemia in other chronic diseases classified elsewhere: Secondary | ICD-10-CM | POA: Diagnosis not present

## 2022-10-24 DIAGNOSIS — M4626 Osteomyelitis of vertebra, lumbar region: Secondary | ICD-10-CM | POA: Diagnosis not present

## 2022-10-24 DIAGNOSIS — I953 Hypotension of hemodialysis: Secondary | ICD-10-CM | POA: Diagnosis not present

## 2022-10-24 DIAGNOSIS — I5033 Acute on chronic diastolic (congestive) heart failure: Secondary | ICD-10-CM | POA: Diagnosis not present

## 2022-10-24 DIAGNOSIS — I2690 Septic pulmonary embolism without acute cor pulmonale: Secondary | ICD-10-CM | POA: Diagnosis not present

## 2022-10-24 DIAGNOSIS — N186 End stage renal disease: Secondary | ICD-10-CM | POA: Diagnosis not present

## 2022-10-24 DIAGNOSIS — I330 Acute and subacute infective endocarditis: Secondary | ICD-10-CM | POA: Diagnosis not present

## 2022-10-24 DIAGNOSIS — J189 Pneumonia, unspecified organism: Secondary | ICD-10-CM | POA: Diagnosis not present

## 2022-10-24 DIAGNOSIS — E854 Organ-limited amyloidosis: Secondary | ICD-10-CM | POA: Diagnosis not present

## 2022-10-24 DIAGNOSIS — I4891 Unspecified atrial fibrillation: Secondary | ICD-10-CM | POA: Diagnosis not present

## 2022-10-24 DIAGNOSIS — E440 Moderate protein-calorie malnutrition: Secondary | ICD-10-CM | POA: Diagnosis not present

## 2022-10-24 DIAGNOSIS — I39 Endocarditis and heart valve disorders in diseases classified elsewhere: Secondary | ICD-10-CM | POA: Diagnosis not present

## 2022-10-24 DIAGNOSIS — Z992 Dependence on renal dialysis: Secondary | ICD-10-CM | POA: Diagnosis not present

## 2022-10-24 MED ORDER — MIDODRINE HCL 5 MG PO TABS
15.0000 mg | ORAL_TABLET | Freq: Three times a day (TID) | ORAL | Status: DC
  Administered 2022-10-24 – 2022-10-29 (×17): 15 mg via ORAL
  Filled 2022-10-24 (×17): qty 3

## 2022-10-24 MED ORDER — PHENOL 1.4 % MT LIQD
1.0000 | OROMUCOSAL | Status: DC | PRN
  Administered 2022-10-29: 1 via OROMUCOSAL
  Filled 2022-10-24: qty 177

## 2022-10-24 NOTE — Progress Notes (Signed)
Inpatient Rehab Admissions Coordinator:   I have no beds available for this patient to admit to CIR today.  Will continue to follow for timing of potential admission pending bed availability.   Estill Dooms, PT, DPT Admissions Coordinator [phone] 10/24/22  10:53 AM

## 2022-10-24 NOTE — Progress Notes (Signed)
PROGRESS NOTE    Ryan [name]  ZOX:[id number] DOB: 07-20-1960 DOA: 10/09/2022 PCP: Sharin Grave, MD    Brief Narrative:   62 year old male with history of hypertension, hyperlipidemia, ESRD on HD was admitted with new onset atrial fibrillation with rapid ventricular rate. Patient initially presented on 08/28/2022 with low back pain, found to have evidence of L5-S1 discitis and osteomyelitis, MSSA bacteremia with mitral valve endocarditis. CT of the chest showed septic emboli with a new airspace opacities in the left upper lobe. MRI of the brain showed scattered subcentimeter infarcts involving the bilateral cerebral hemisphere and right cerebellum. CT surgery  was consulted, recommended conservative management.  Patient was recommended to continue with cefazolin until 11/22/2022 per ID recommendations. Patient also had small bowel obstruction during that hospital stay, was discharged to rehab on 09/25/2022. Patient started to experience cough on 10/02/2022, diagnosed with a pneumonia/aspiration pneumonia, was treated with 5 days of IV Zosyn. Patient was noted to be fluid overloaded, being managed at the hemodialysis. Patient also had dysphagia, drop in hemoglobin to 6.1 with a stool occult being positive. Patient was seen by GI, esophagram performed which was negative for any acute pathology. On 10/09/2022 developed atrial fibrillation  with rapid ventricular rate, and was transferred to [facility]. EKG showed first-degree AV block, sinus tachycardia. Patient was found to be in any respiratory distress due to combination of, pneumonia. Patient was started on Zosyn, transition to cefazolin. Patient developed significant respiratory failure, transferred to ICU, underwent CRRT.   At this time, plan clinically has improved.  He does have a hypotension but asymptomatic.  He pending disposition to CIR.  Assessment and plan  Acute hypoxic respiratory failure Secondary to fluid overload, pneumonia.  Continue with cefazolin until 11/04/2022. On nasal cannula oxygen at 2 L/min.   Pneumonia secondary to septic emboli due to endocarditis On cefazolin, continue DuoNebs, improved.  States that his breathing has improved.  On nasal cannula oxygen.   Chronic hypotension On reduced  dose of metoprolol and midodrine. Asymptomatic.  Marland Kitchen     ESRD on HD -Continue with the hemodialysis as per schedule. Nephrology on board.  Paroxysmal atrial fibrillation with rapid ventricular rate Initially required Cardizem drip.  Currently rate  is largely controlled with metoprolol.  Dysphagia Evaluated by speech therapy, on dysphagia III diet with thin liquids.   Anemia of chronic disease, Chronic kidney disease -Closely monitor CBC, transfuse for hemoglobin less than 7.  Latest hemoglobin of 10.4.   Infectious endocarditis of mitral valve With septic embolization to the lungs and brain. MSSA bacteremia. On cefazolin.   L5-S1 discitis with osteomyelitis Continue cefazolin until 11/22/2022   Nutrition Status: Nutrition Problem: Moderate Malnutrition Etiology: chronic illness (worsening with acute illness) Signs/Symptoms: moderate muscle depletion, moderate fat depletion, energy intake < 75% for > or equal to 1 month Interventions: Refer to RD note for recommendations     DVT prophylaxis: heparin injection 5,000 Units Start: 10/09/22 1700   Code Status:     Code Status: Full Code  Disposition: CIR as per PT recommendation.  Medically stable for disposition.  Status is: Inpatient  Remains inpatient appropriate because: IV cefazolin, need for CIR, hemodialysis   Family Communication: None at bedside  Consultants:  Nephrology Cardiology  Procedures:   hemodialysis  Antimicrobials:  Cefazolin until 8/13  Subjective: Today, patient was seen and examined at bedside.  States that he feels much better today with shortness of breath.  Was able to sleep better.  Objective: Vitals:  10/24/22 0336 10/24/22 0726 10/24/22 0851 10/24/22 1130  BP: 94/70   (!) 80/50  Pulse: 100  (!) 102   Resp: 20  (!) 24 (!) 26  Temp: 97.6 F (36.4 C)   97.9 F (36.6 C)  TempSrc: Axillary     SpO2: 96% 99%  94%  Weight:      Height:        Intake/Output Summary (Last 24 hours) at 10/24/2022 1423 Last data filed at 10/23/2022 2200 Gross per 24 hour  Intake 120 ml  Output --  Net 120 ml   Filed Weights   10/20/22 0814  Weight: 75.7 kg    Physical Examination: Body mass index is 24.65 kg/m.   General:  Average built, not in obvious distress, on room air HENT:   No scleral pallor or icterus noted. Oral mucosa is moist.  Chest:   Diminished breath sounds bilaterally.   Left chest wall hemodialysis catheter in place. CVS: S1 &S2 heard. No murmur.  Regular rate and rhythm. Abdomen: Soft, nontender, nondistended.  Bowel sounds are heard.   Extremities: No cyanosis, clubbing or edema.  Psych: Alert, awake and oriented, normal mood CNS:  No cranial nerve deficits.  Moves all extremities Skin: Warm and dry.    Data Reviewed:   CBC: Recent Labs  Lab 10/18/22 0143 10/19/22 0035 10/23/22 0043 10/24/22 0026  WBC 12.5* 11.6* 13.5* 12.1*  HGB 9.1* 9.2* 10.0* 10.4*  HCT 29.7* 30.5* 33.2* 34.3*  MCV 83.7 82.7 85.1 84.9  PLT 218 227 227 238    Basic Metabolic Panel: Recent Labs  Lab 10/18/22 1141 10/21/22 2318 10/23/22 0043 10/24/22 0026  NA 132* 129* 126* 128*  K 4.6 3.7 4.0 3.8  CL 92* 92* 86* 89*  CO2 21* 24 21* 23  GLUCOSE 119* 110* 110* 97  BUN 44* 29* 57* 39*  CREATININE 4.78* 3.63* 5.43* 3.99*  CALCIUM 9.2 9.0 9.1 8.9  MG  --  1.8 1.8 1.7    Liver Function Tests: No results for input(s): "AST", "ALT", "ALKPHOS", "BILITOT", "PROT", "ALBUMIN" in the last 168 hours.    Radiology Studies: DG Chest 2 View  Result Date: 10/23/2022 CLINICAL DATA:  [id number] Pulmonary edema [id number] EXAM: CHEST - 2 VIEW COMPARISON:  October 14, 2022, September 07, 2022 FINDINGS: The  cardiomediastinal silhouette is unchanged in contour.LEFT chest CVC with tip terminating in the RIGHT atrium. Dilation of pulmonary vasculature. No pneumothorax. There is a RIGHT perihilar opacity which projects posterior to the mediastinal vessels; this is new since June 16th 2024. This likely reflects loculated fluid within the minor fissure. Central vascular congestion with favored overall improvement of pulmonary edema. Gaseous distension of the stomach. Multilevel degenerative changes of the thoracic spine. IMPRESSION: 1. There is a RIGHT perihilar opacity which projects posterior to the mediastinal vessels. This likely reflects loculated fluid within the minor fissure. Recommend attention on follow-up. 2. Central vascular congestion with favored overall improvement of pulmonary edema Electronically Signed   By: Meda Klinefelter M.D.   On: 10/23/2022 18:32      LOS: 15 days    Joycelyn Das, MD [facility] Available via Epic secure chat 7am-7pm After these hours, please refer to coverage provider listed on [email or web address] 10/24/2022, 2:23 PM

## 2022-10-24 NOTE — Progress Notes (Signed)
Subjective:  No cos , said tolerated 3 l uf in recliner yest , still rare cough , CXR post hd yest as below , awaiting rehab bed readmit / Still ASYMP  low bps  will increase Midodrine 10 mg to 15 mg  tid  Objective Vital signs in last 24 hours: Vitals:   10/23/22 2127 10/23/22 2252 10/24/22 0336 10/24/22 0726  BP: 96/66 96/70 94/70    Pulse: (!) 105 69 100   Resp:  20 20   Temp:  97.7 F (36.5 C) 97.6 F (36.4 C)   TempSrc:  Axillary Axillary   SpO2:  98% 96% 99%  Weight:      Height:       Weight change:   Physical Exam: General: Chronically ill male NAD Heart: Ireg, irreg  hrt rt stable now  Lungs: CTA, nonlabored breathing , 2 l O'Brien  Abdomen: NABS, soft nontender no ascites nondistended Extremities: no edema Dialysis Access: TDC     OP Dialysis Orders: TTS at [facility] 3:45hr, 450/A1.5, EDW 85.7kg, 2K/2Ca bath, TDC, heparin 3000 unit bolus - no ESA - Calcitriol 1.95mcg PO q HD     Problem/Plan: MSSA bacteremia/endocarditis/L5-S1 discitis: Blood Cx 6/16 MSSA, negative Cx on 6/17, 6/19. S/p TDC line holiday. TTE was concerning for endocarditis. TEE cancelled d/t high risk factors. LS MRI with possible L5-S1 discitis. On IV Cefazolin 2g q HD x 6 weeks (until 11/04/22). BP/Volume overload: Hypotension preventing adequate UF. Marland Kitchen Required ICU transfer for CRRT 7/21 - 10/15/22, able to get down to ~ 74kg range. Weight back up to 79kg on 7/26 - dialyzed but unable to get any fluid off due to hypotension and intermittent tachycardia. No effect with IV albumin and midodrine 10mg  tid  Metoprolol dose reduced from 50mg  TID -> 25mg  TID. Fluid restrictions disc /  Will increase  midodine  to 15mg   now tolerating UF with treatments, next Tomor per usual schedule - increase heparin given clotting, bring in chair per his request  cxr after hd 8/01  improving  not resolved Pulm. Edema  Deconditioning /weakness: was in CIR, was transferred back to 6E due to  medical complications now awaiting transfer back  CIR  ESRD: . Usual TTS schedule, see abv Access: TDC pulled 6/18 for line holiday, then new LIJ TDC placed 7/01 per IR. For perm access: s/p L AVF ligation in 2019. Last seen by Dr. Edilia Bo 01/2022 - limited mobility in R arm so planning for L arm graft but had not scheduled surgery yet. Can revisit as an outpatient once bacteremia resolved.  Anemia (ESRD + ABLA, + FOBT): Hgb 10.0  - stable for now. Continue Aranesp q Thursday.   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 Hold VDRA. Nutrition - Renal diet, Alb low - continue supplement    Lenny Pastel, PA-C [facility] Beeper [phone] 10/24/2022,8:51 AM  LOS: 15 days   Labs: Basic Metabolic Panel: Recent Labs  Lab 10/21/22 2318 10/23/22 0043 10/24/22 0026  NA 129* 126* 128*  K 3.7 4.0 3.8  CL 92* 86* 89*  CO2 24 21* 23  GLUCOSE 110* 110* 97  BUN 29* 57* 39*  CREATININE 3.63* 5.43* 3.99*  CALCIUM 9.0 9.1 8.9   Liver Function Tests: No results for input(s): "AST", "ALT", "ALKPHOS", "BILITOT", "PROT", "ALBUMIN" in the last 168 hours. No results for input(s): "LIPASE", "AMYLASE" in the last 168 hours. No results for input(s): "AMMONIA" in the last 168 hours. CBC: Recent Labs  Lab 10/18/22 0143 10/19/22 0035 10/23/22 0043 10/24/22 0026  WBC 12.5* 11.6* 13.5* 12.1*  HGB 9.1* 9.2* 10.0* 10.4*  HCT 29.7* 30.5* 33.2* 34.3*  MCV 83.7 82.7 85.1 84.9  PLT 218 227 227 238   Cardiac Enzymes: No results for input(s): "CKTOTAL", "CKMB", "CKMBINDEX", "TROPONINI" in the last 168 hours. CBG: No results for input(s): "GLUCAP" in the last 168 hours.  Studies/Results: DG Chest 2 View  Result Date: 10/23/2022 CLINICAL DATA:  [id number] Pulmonary edema [id number] EXAM: CHEST - 2 VIEW COMPARISON:  October 14, 2022, September 07, 2022 FINDINGS: The cardiomediastinal silhouette is unchanged in contour.LEFT chest CVC with tip terminating in the RIGHT atrium. Dilation of pulmonary vasculature. No pneumothorax. There is a RIGHT  perihilar opacity which projects posterior to the mediastinal vessels; this is new since June 16th 2024. This likely reflects loculated fluid within the minor fissure. Central vascular congestion with favored overall improvement of pulmonary edema. Gaseous distension of the stomach. Multilevel degenerative changes of the thoracic spine. IMPRESSION: 1. There is a RIGHT perihilar opacity which projects posterior to the mediastinal vessels. This likely reflects loculated fluid within the minor fissure. Recommend attention on follow-up. 2. Central vascular congestion with favored overall improvement of pulmonary edema Electronically Signed   By: Meda Klinefelter M.D.   On: 10/23/2022 18:32   Medications:  sodium chloride 10 mL/hr at 10/18/22 2141    ceFAZolin (ANCEF) IV 2 g (10/23/22 1833)   lactated ringers      (feeding supplement) PROSource Plus  30 mL Oral TID WC   arformoterol  15 mcg Nebulization BID   Chlorhexidine Gluconate Cloth  6 each Topical Q0600   Darbepoetin Alfa  150 mcg Subcutaneous Q Thu-1800   feeding supplement (NEPRO CARB STEADY)  237 mL Oral TID BM   heparin  5,000 Units Subcutaneous Q8H   metoprolol tartrate  25 mg Oral Q8H   midodrine  15 mg Oral TID WC   multivitamin  1 tablet Oral QHS   mouth rinse  15 mL Mouth Rinse 4 times per day   pantoprazole  40 mg Oral Daily   revefenacin  175 mcg Nebulization Daily

## 2022-10-24 NOTE — Care Management Important Message (Signed)
Important Message  Patient Details  Name: Ryan [name] MRN: [id number] Date of Birth: 1960-05-31   Medicare Important Message Given:  Yes     [name] 10/24/2022, 11:28 AM

## 2022-10-24 NOTE — TOC Progression Note (Signed)
Transition of Care [facility]) - Progression Note    Patient Details  Name: Ryan [name] MRN: [id number] Date of Birth: [birth year]  Transition of [facility]) CM/SW Contact  Graves-Bigelow, Lamar Laundry, RN Phone Number: 10/24/2022, 3:10 PM  Clinical Narrative: CIR has received insurance authorization for the patient. Per Inpatient Rehab Admissions Coordinator, no bed availability today. Weekend Coordinator will follow for possible admission over the weekend. Weekend Case Manager to follow for transition of care needs.   Expected Discharge Plan: IP Rehab Facility Barriers to Discharge: No Barriers Identified  Expected Discharge Plan and Services   Discharge Planning Services: CM Consult Post Acute Care Choice: IP Rehab Living arrangements for the past 2 months: Single Family Home  Social Determinants of Health (SDOH) Interventions SDOH Screenings   Food Insecurity: No Food Insecurity (10/09/2022)  Housing: Low Risk  (10/09/2022)  Transportation Needs: No Transportation Needs (10/09/2022)  Utilities: Not At Risk (10/09/2022)  Financial Resource Strain: Medium Risk (05/16/2022)   Received from [facility], [facility]  Tobacco Use: Low Risk  (10/09/2022)    Readmission Risk Interventions    09/10/2022    1:53 PM  Readmission Risk Prevention Plan  Transportation Screening Complete  PCP or Specialist Appt within 5-7 Days Complete  Home Care Screening Complete  Medication Review (RN CM) Referral to Pharmacy

## 2022-10-24 NOTE — Progress Notes (Signed)
Physical Therapy Treatment Patient Details Name: Ryan [name] MRN: [id number] DOB: 11-22-1960 Today's Date: 10/24/2022   History of Present Illness 62 yo male presents to [facility] from CIR on 7/18 for afib RVR, aspiration PNA. RR on 7/21 for acute respiratory distress due to pulmonary edema requiring bipap and CRRT, and transfer to ICU. 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 S/p  HD cath L subclavian on 7/1. PMH includes HTN, HLD, ESRD on HD TTS.    PT Comments  Pt progressing well with mobility, ambulating short hallway distance x2 with extended seated rest break to recover fatigue. Pt requiring cues for form/safety throughout mobility. HR stable 100-118 during gait. Pt tolerated LE strengthening exercises well. D/c plan remains appropriate.    If plan is discharge home, recommend the following: Help with stairs or ramp for entrance;Assist for transportation;Assistance with cooking/housework;A little help with walking and/or transfers;A little help with bathing/dressing/bathroom   Can travel by private Psychologist, clinical (4 wheels)    Recommendations for Other Services       Precautions / Restrictions Precautions Precautions: Fall Precaution Comments: watch for hypotension Required Braces or Orthoses: Spinal Brace Spinal Brace: Lumbar corset;Other (comment) Spinal Brace Comments: for comfort Restrictions Weight Bearing Restrictions: No     Mobility  Bed Mobility Overal bed mobility: Needs Assistance             General bed mobility comments: up in recliner    Transfers Overall transfer level: Needs assistance Equipment used: Rollator (4 wheels) Transfers: Sit to/from Stand Sit to Stand: Min guard           General transfer comment: for safety, cues for slow eccentric lower from stand to sit     Ambulation/Gait Ambulation/Gait assistance: Min guard Gait Distance (Feet): 75 Feet (x2 - seated rest break) Assistive device: Rollator (4 wheels) Gait Pattern/deviations: Step-through pattern, Trunk flexed, Decreased stride length Gait velocity: decr     General Gait Details: close guard for safety, cues for rollator proximity and use of rollator. HR 100-118 bpm   Stairs             Wheelchair Mobility     Tilt Bed    Modified Rankin (Stroke Patients Only)       Balance Overall balance assessment: Needs assistance Sitting-balance support: No upper extremity supported, Feet supported Sitting balance-Leahy Scale: Good     Standing balance support: Single extremity supported, Bilateral upper extremity supported, Reliant on assistive device for balance Standing balance-Leahy Scale: Poor Standing balance comment: UE support and min guard for static standing                            Cognition Arousal/Alertness: Awake/alert Behavior During Therapy: WFL for tasks assessed/performed Overall Cognitive Status: Within Functional Limits for tasks assessed                                          Exercises General Exercises - Lower Extremity Long Arc Quad: AROM, Both, 15 reps, Seated Hip Flexion/Marching: AROM, Both, 15 reps, Seated Heel Raises: AROM, Both, 15 reps, Seated Other Exercises Other Exercises: sit<>stand 2x5 reps    General Comments        Pertinent Vitals/Pain  Pain Assessment Pain Assessment: Faces Faces Pain Scale: Hurts little more Pain Location: back Pain Descriptors / Indicators: Discomfort Pain Intervention(s): Limited activity within patient's tolerance, Monitored during session, Repositioned    Home Living                          Prior Function            PT Goals (current goals can now be found in the care plan section) Acute Rehab PT Goals Patient Stated Goal: back to rehab to get  stronger PT Goal Formulation: With patient Time For Goal Achievement: 11/03/22 Potential to Achieve Goals: Good Progress towards PT goals: Progressing toward goals    Frequency    Min 1X/week      PT Plan Current plan remains appropriate    Co-evaluation              AM-PAC PT "6 Clicks" Mobility   Outcome Measure  Help needed turning from your back to your side while in a flat bed without using bedrails?: A Little Help needed moving from lying on your back to sitting on the side of a flat bed without using bedrails?: A Little Help needed moving to and from a bed to a chair (including a wheelchair)?: A Little Help needed standing up from a chair using your arms (e.g., wheelchair or bedside chair)?: A Little Help needed to walk in hospital room?: A Little Help needed climbing 3-5 steps with a railing? : A Lot 6 Click Score: 17    End of Session Equipment Utilized During Treatment: Back brace Activity Tolerance: Patient tolerated treatment well Patient left: in chair;with call bell/phone within reach;Other (comment) (pt up upon PT arrival to room with no chair alarm, pt knows to press call button and wait for assist prior to mobilizing) Nurse Communication: Mobility status PT Visit Diagnosis: Other abnormalities of gait and mobility (R26.89);Muscle weakness (generalized) (M62.81) Pain - part of body:  (back)     Time: 8413-2440 PT Time Calculation (min) (ACUTE ONLY): 29 min  Charges:    $Gait Training: 8-22 mins $Therapeutic Exercise: 8-22 mins PT General Charges $$ ACUTE PT VISIT: 1 Visit                     Marye Round, PT DPT Acute Rehabilitation Services Secure Chat Preferred  Office [phone]     E Christain [place] 10/24/2022, 3:35 PM

## 2022-10-24 NOTE — Progress Notes (Signed)
Occupational Therapy Treatment Patient Details Name: Ryan [name] MRN: [id number] DOB: [birth year] Today's Date: 10/24/2022   History of present illness 62 yo male presents to [facility] from CIR on 7/18 for afib RVR, aspiration PNA. RR on 7/21 for acute respiratory distress due to pulmonary edema requiring bipap and CRRT, and transfer to ICU. 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 S/p  HD cath L subclavian on 7/1. PMH includes HTN, HLD, ESRD on HD TTS.   OT comments  Pt feeling well. Asking to address UB bathing and dressing which he completed with min assist in sitting followed by application of deodorant. Pt grateful for help. Patient will benefit from intensive inpatient follow up therapy, >3 hours/day.   Recommendations for follow up therapy are one component of a multi-disciplinary discharge planning process, led by the attending physician.  Recommendations may be updated based on patient status, additional functional criteria and insurance authorization.    Assistance Recommended at Discharge Frequent or constant Supervision/Assistance  Patient can return home with the following  Assistance with cooking/housework;Help with stairs or ramp for entrance;Assist for transportation;Direct supervision/assist for medications management;Direct supervision/assist for financial management;A little help with walking and/or transfers;A little help with bathing/dressing/bathroom   Equipment Recommendations  Tub/shower bench;BSC/3in1    Recommendations for Other Services      Precautions / Restrictions Precautions Precautions: Fall Precaution Comments: watch for hypotension Required Braces or Orthoses: Spinal Brace Spinal Brace: Lumbar corset;Other (comment) Spinal Brace Comments: for comfort Restrictions Weight Bearing Restrictions: No       Mobility Bed Mobility                General bed mobility comments: up in recliner    Transfers Overall transfer level: Needs assistance Equipment used: Rolling walker (2 wheels) Transfers: Sit to/from Stand Sit to Stand: Min guard           General transfer comment: for safety, cues to control descent     Balance Overall balance assessment: Needs assistance   Sitting balance-Leahy Scale: Good       Standing balance-Leahy Scale: Poor                             ADL either performed or assessed with clinical judgement   ADL Overall ADL's : Needs assistance/impaired         Upper Body Bathing: Minimal assistance;Sitting       Upper Body Dressing : Minimal assistance;Sitting                     General ADL Comments: assisted to wash under L arm and apply deodorant as lines in the way, pt propping R UE on sink to reach to armpit    Extremity/Trunk Assessment              Vision       Perception     Praxis      Cognition Arousal/Alertness: Awake/alert Behavior During Therapy: WFL for tasks assessed/performed Overall Cognitive Status: Within Functional Limits for tasks assessed                                          Exercises      Shoulder Instructions       General  Comments      Pertinent Vitals/ Pain       Pain Assessment Pain Assessment: Faces Faces Pain Scale: No hurt  Home Living                                          Prior Functioning/Environment              Frequency  Min 1X/week        Progress Toward Goals  OT Goals(current goals can now be found in the care plan section)  Progress towards OT goals: Progressing toward goals  Acute Rehab OT Goals OT Goal Formulation: With patient Time For Goal Achievement: 11/05/22 Potential to Achieve Goals: Good  Plan Discharge plan remains appropriate    Co-evaluation                 AM-PAC OT "6 Clicks" Daily Activity      Outcome Measure   Help from another person eating meals?: None Help from another person taking care of personal grooming?: A Little Help from another person toileting, which includes using toliet, bedpan, or urinal?: A Little Help from another person bathing (including washing, rinsing, drying)?: A Little Help from another person to put on and taking off regular upper body clothing?: A Little Help from another person to put on and taking off regular lower body clothing?: A Little 6 Click Score: 19    End of Session Equipment Utilized During Treatment: Rolling walker (2 wheels);Gait belt  OT Visit Diagnosis: Unsteadiness on feet (R26.81);Muscle weakness (generalized) (M62.81);Pain   Activity Tolerance Patient tolerated treatment well   Patient Left in chair;with call bell/phone within reach   Nurse Communication          Time: 1610-9604 OT Time Calculation (min): 18 min  Charges: OT General Charges $OT Visit: 1 Visit OT Treatments $Self Care/Home Management : 8-22 mins  Berna Spare, OTR/L Acute Rehabilitation Services Office: [phone]   Evern Bio 10/24/2022, 4:17 PM

## 2022-10-25 DIAGNOSIS — E440 Moderate protein-calorie malnutrition: Secondary | ICD-10-CM | POA: Diagnosis not present

## 2022-10-25 DIAGNOSIS — I10 Essential (primary) hypertension: Secondary | ICD-10-CM | POA: Diagnosis not present

## 2022-10-25 DIAGNOSIS — I4719 Other supraventricular tachycardia: Secondary | ICD-10-CM | POA: Diagnosis not present

## 2022-10-25 DIAGNOSIS — N186 End stage renal disease: Secondary | ICD-10-CM | POA: Diagnosis not present

## 2022-10-25 DIAGNOSIS — I4891 Unspecified atrial fibrillation: Secondary | ICD-10-CM | POA: Diagnosis not present

## 2022-10-25 DIAGNOSIS — Z992 Dependence on renal dialysis: Secondary | ICD-10-CM | POA: Diagnosis not present

## 2022-10-25 DIAGNOSIS — J189 Pneumonia, unspecified organism: Secondary | ICD-10-CM | POA: Diagnosis not present

## 2022-10-25 DIAGNOSIS — I39 Endocarditis and heart valve disorders in diseases classified elsewhere: Secondary | ICD-10-CM | POA: Diagnosis not present

## 2022-10-25 DIAGNOSIS — E785 Hyperlipidemia, unspecified: Secondary | ICD-10-CM | POA: Diagnosis not present

## 2022-10-25 MED ORDER — HEPARIN SODIUM (PORCINE) 1000 UNIT/ML IJ SOLN
INTRAMUSCULAR | Status: AC
  Administered 2022-10-25: 1000 [IU]
  Filled 2022-10-25: qty 4

## 2022-10-25 MED ORDER — LORATADINE 10 MG PO TABS
10.0000 mg | ORAL_TABLET | Freq: Every day | ORAL | Status: DC | PRN
  Administered 2022-10-25 – 2022-10-30 (×5): 10 mg via ORAL
  Filled 2022-10-25 (×5): qty 1

## 2022-10-25 MED ORDER — HEPARIN SODIUM (PORCINE) 1000 UNIT/ML IJ SOLN
INTRAMUSCULAR | Status: AC
  Administered 2022-10-25: 4000 [IU]
  Filled 2022-10-25: qty 2

## 2022-10-25 NOTE — Assessment & Plan Note (Signed)
MSSA bacteremia with embolization to the brain and lungs. Plan to continue with IV cefazolin.   L4 and S1 discitis and osteomyelitis, will need antibiotic therapy with IV cephalosporin until 08/31.

## 2022-10-25 NOTE — Assessment & Plan Note (Addendum)
Hyponatremia.   Today Na is 126, K 3,3 and serum bicarbonate at 19. BUN 28 down from 54  Mg 2,4  Continue blood pressure support with midodrine (20 mg tid) for renal replacement therapy.   Anemia of chronic renal disease.  Follow up hgb is 9.5

## 2022-10-25 NOTE — Assessment & Plan Note (Addendum)
Acute hypoxemic respiratory failure.   Oxygenation has improved. Plan to continue antibiotic therapy until 08/13 with cefazolin.  Continue bronchodilator therapy. His 02 saturation is 99% on room air today.

## 2022-10-25 NOTE — Progress Notes (Signed)
Subjective:  No cos , said tolerated 3 l uf in recliner yest , still rare cough , CXR post hd yest as below , awaiting rehab bed readmit / Still ASYMP  low bps  will increase Midodrine 10 mg to 15 mg  tid  Objective Vital signs in last 24 hours: Vitals:   10/24/22 2316 10/25/22 0337 10/25/22 0745 10/25/22 0815  BP: (!) 100/59 93/66 (!) 101/53 (!) 127/103  Pulse: 97 (!) 103 (!) 111 (!) 109  Resp: 19 (!) 22 (!) 26 (!) 32  Temp: 97.7 F (36.5 C) 97.6 F (36.4 C) 97.6 F (36.4 C) (!) 97.1 F (36.2 C)  TempSrc: Axillary Axillary Axillary   SpO2: 97% 92% 97% 94%  Weight:      Height:       Weight change:   Physical Exam: General: Chronically ill male NAD Heart: Ireg, irreg  hrt rt stable now  Lungs: CTA, nonlabored breathing , 2 l [place]  Abdomen: NABS, soft nontender no ascites nondistended Extremities: no edema Dialysis Access: TDC     OP Dialysis Orders: TTS at [facility] 3:45hr, 450/A1.5, EDW 85.7kg, 2K/2Ca bath, TDC, heparin 3000 unit bolus - no ESA - Calcitriol 1.33mcg PO q HD     Problem/Plan: MSSA bacteremia/endocarditis/L5-S1 discitis: Blood Cx 6/16 MSSA, negative Cx on 6/17, 6/19. S/p TDC line holiday. TTE was concerning for endocarditis. TEE cancelled d/t high risk factors. LS MRI with possible L5-S1 discitis. On IV Cefazolin 2g q HD x 6 weeks (until 11/04/22). BP/Volume overload: Hypotension preventing adequate UF. Marland Kitchen Required ICU transfer for CRRT 7/21 - 10/15/22, able to get down to ~ 74kg range. Weight back up to 79kg on 7/26 - dialyzed but unable to get any fluid off due to hypotension and intermittent tachycardia. No effect with IV albumin and midodrine 10mg  tid  Metoprolol dose reduced from 50mg  TID -> 25mg  TID. Fluid restrictions disc /  Have increased midodine  to 15mg   now tolerating UF with treatments,  HD today per usual schedule - increased heparin given clotting, UFG 2.5L today, standing post weight and initiate daily weights Deconditioning /weakness: was in CIR, was  transferred back to 6E due to  medical complications now awaiting transfer back CIR  ESRD: . Usual TTS schedule, see abv Access: TDC pulled 6/18 for line holiday, then new [facility] TDC placed 7/01 per IR. For perm access: s/p L AVF ligation in 2019. Last seen by Dr. Edilia Bo 01/2022 - limited mobility in R arm so planning for L arm graft but had not scheduled surgery yet. Can revisit as an outpatient once bacteremia resolved.  Anemia (ESRD + ABLA, + FOBT): Hgb 10.0  - stable for now. Continue Aranesp q Thursday.   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 Hold VDRA. Nutrition - Renal diet, Alb low - continue supplement    Estill Bakes MD [facility] Pager [phone]   Labs: Basic Metabolic Panel: Recent Labs  Lab 10/23/22 0043 10/24/22 0026 10/25/22 0414  NA 126* 128* 123*  K 4.0 3.8 4.2  CL 86* 89* 87*  CO2 21* 23 21*  GLUCOSE 110* 97 103*  BUN 57* 39* 66*  CREATININE 5.43* 3.99* 5.79*  CALCIUM 9.1 8.9 9.1   Liver Function Tests: No results for input(s): "AST", "ALT", "ALKPHOS", "BILITOT", "PROT", "ALBUMIN" in the last 168 hours. No results for input(s): "LIPASE", "AMYLASE" in the last 168 hours. No results for input(s): "AMMONIA" in the last 168 hours. CBC: Recent Labs  Lab 10/19/22 0035 10/23/22 0043 10/24/22 0026 10/25/22 0414  WBC 11.6* 13.5* 12.1* 11.0*  HGB 9.2* 10.0* 10.4* 9.5*  HCT 30.5* 33.2* 34.3* 31.0*  MCV 82.7 85.1 84.9 80.7  PLT 227 227 238 244   Cardiac Enzymes: No results for input(s): "CKTOTAL", "CKMB", "CKMBINDEX", "TROPONINI" in the last 168 hours. CBG: No results for input(s): "GLUCAP" in the last 168 hours.  Studies/Results: DG Chest 2 View  Result Date: 10/23/2022 CLINICAL DATA:  [id number] Pulmonary edema [id number] EXAM: CHEST - 2 VIEW COMPARISON:  October 14, 2022, September 07, 2022 FINDINGS: The cardiomediastinal silhouette is unchanged in contour.LEFT chest CVC with tip terminating in the RIGHT atrium. Dilation of pulmonary  vasculature. No pneumothorax. There is a RIGHT perihilar opacity which projects posterior to the mediastinal vessels; this is new since June 16th 2024. This likely reflects loculated fluid within the minor fissure. Central vascular congestion with favored overall improvement of pulmonary edema. Gaseous distension of the stomach. Multilevel degenerative changes of the thoracic spine. IMPRESSION: 1. There is a RIGHT perihilar opacity which projects posterior to the mediastinal vessels. This likely reflects loculated fluid within the minor fissure. Recommend attention on follow-up. 2. Central vascular congestion with favored overall improvement of pulmonary edema Electronically Signed   By: Meda Klinefelter M.D.   On: 10/23/2022 18:32   Medications:  sodium chloride 10 mL/hr at 10/18/22 2141    ceFAZolin (ANCEF) IV 2 g (10/23/22 1833)   lactated ringers      (feeding supplement) PROSource Plus  30 mL Oral TID WC   arformoterol  15 mcg Nebulization BID   Chlorhexidine Gluconate Cloth  6 each Topical Q0600   Darbepoetin Alfa  150 mcg Subcutaneous Q Thu-1800   feeding supplement (NEPRO CARB STEADY)  237 mL Oral TID BM   heparin  5,000 Units Subcutaneous Q8H   metoprolol tartrate  25 mg Oral Q8H   midodrine  15 mg Oral TID WC   multivitamin  1 tablet Oral QHS   mouth rinse  15 mL Mouth Rinse 4 times per day   pantoprazole  40 mg Oral Daily   revefenacin  175 mcg Nebulization Daily

## 2022-10-25 NOTE — Assessment & Plan Note (Signed)
Continue statin therapy.

## 2022-10-25 NOTE — Plan of Care (Signed)
  Problem: Education: Goal: Knowledge of General Education information will improve Description: Including pain rating scale, medication(s)/side effects and non-pharmacologic comfort measures Outcome: Progressing   Problem: Health Behavior/Discharge Planning: Goal: Ability to manage health-related needs will improve Outcome: Progressing   Problem: Clinical Measurements: Goal: Respiratory complications will improve Outcome: Progressing   Problem: Clinical Measurements: Goal: Cardiovascular complication will be avoided Outcome: Progressing   Problem: Pain Managment: Goal: General experience of comfort will improve Outcome: Progressing   Problem: Skin Integrity: Goal: Risk for impaired skin integrity will decrease Outcome: Progressing

## 2022-10-25 NOTE — Progress Notes (Signed)
  Progress Note   Patient: Ryan [name] ZOX:[id number] DOB: 1961-01-07 DOA: 10/09/2022     16 DOS: the patient was seen and examined on 10/25/2022   Brief hospital course: Mr. Ryan [name] was admitted to the hospital with the working diagnosis of acute hypoxemic respiratory failure due to pneumonia in the setting of endocarditis with septic emboli.   62 yo male with the past medical history of hypertension, ESRD, and hyperlipidemia who was transferred from CIR to inpatient setting due to new onset atrial fibrillation with RVR. Recent hospitalization 06/16 to 07/04 for mitral valve endocarditis, MSSA bacteremia with brain septic emboli.  One week later he was diagnosed with pneumonia, and worsening anemia. On 07/18 he developed atrial fibrillation prompting his transfer back to inpatient setting.   7/18 - Readmitted to [facility] for Afib RVR/SOB requiring BiPAP, inability to complete HD. 7/22 - Off of BiPAP, tolerating Salter. Remains anxious. Tolerating CRRT. 7/24 patient on sinus rhythm and off CRRT.    Assessment and Plan: * Atrial fibrillation with RVR (HCC) Patient required diltiazem infusion, now he has been transitioned to oral metoprolol with good toleration.   CAP (community acquired pneumonia) Acute hypoxemic respiratory failure.   Oxygenation has improved. Plan to continue antibiotic therapy until 08/13 with cefazolin.  Continue bronchodilator therapy.  ESRD on dialysis (HCC) Improved volume status, continue renal replacement therapy with intermittent HD. Had dialysis today.   Anemia of chronic renal disease.    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 Plan to continue with IV cefazolin.   L4 and S1 discitis and osteomyelitis, will need antibiotic therapy with IV cephalosporin until 08/31.    Hyperlipidemia Continue statin therapy.   Hypertension Hypotension.  Patient has been  placed on midodrine for blood pressure support.   Moderate protein malnutrition (HCC) Continue nutritional supplementation,.         Subjective: patient with no chest pain or dyspnea, feeling better.   Physical Exam: Vitals:   10/25/22 1200 10/25/22 1235 10/25/22 1239 10/25/22 1243  BP: 101/70  108/68 (!) 101/59  Pulse: (!) 103  (!) 103 (!) 108  Resp: (!) 34  16 (!) 24  Temp:   (!) 97.3 F (36.3 C) 97.6 F (36.4 C)  TempSrc:    Axillary  SpO2: 99%  100% 98%  Weight:  71.3 kg    Height:       Neurology awake and alert ENT with mild pallor Cardiovascular with S1 and S2 present, irregularly irregular with no gallops, or rubs, positive systolic murmur at the apex No JVD No lower extremity edema Respiratory with no rales or wheezing, no rhonchi Abdomen with no distention  Data Reviewed:    Family Communication: no family at the bedside   Disposition: Status is: Inpatient Remains inpatient appropriate because: IV antibiotic therapy, possible transfer to SNF  Planned Discharge Destination: Skilled nursing facility     Author: Coralie Keens, MD 10/25/2022 3:56 PM  For on call review [email or web address].

## 2022-10-25 NOTE — Assessment & Plan Note (Addendum)
Features of MAT, now on sinus rhythm.   Ruled out atrial fibrillation, (note from cardiology consultation from 07.18 concluded no atrial fibrillation).   08/04 EKG 106 bpm, normal axis, normal intervals, sinus rhythm with PAC, no significant ST segment or T wave changes.   Patient has been on diltiazem and then on metoprolol for rate control. Now due to hypotension, AV blockade has been discontinued.  Sinus rhythm on telemetry.

## 2022-10-25 NOTE — Assessment & Plan Note (Signed)
Continue nutritional supplementation,.

## 2022-10-25 NOTE — Assessment & Plan Note (Addendum)
Hypotension.  Midodrine 20 mg tid.  Metoprolol has been discontinued.   Echocardiogram with preserved LV systolic function EF 55 to 60%, moderate LVH, RV systolic function preserved, LA with mild dilatation, large rounded structure on the anterior leflet of the mitral valve, partially obstructing mitral inflow with ball-valve type motion. No mitral stenosis or mitral regurgitation. Mild aortic valve stenosis.

## 2022-10-25 NOTE — Progress Notes (Signed)
   10/25/22 1239  Vitals  Temp (!) 97.3 F (36.3 C)  Pulse Rate (!) 103  Resp 16  BP 108/68  SpO2 100 %  O2 Device Nasal Cannula  Oxygen Therapy  O2 Flow Rate (L/min) 2 L/min  Patient Activity (if Appropriate) In chair  Pulse Oximetry Type Continuous  Post Treatment  Dialyzer Clearance Clear  Duration of HD Treatment -hour(s) 3.75 hour(s)  Hemodialysis Intake (mL) 0 mL  Liters Processed 78.7  Fluid Removed (mL) 2500 mL  Tolerated HD Treatment Yes  Post-Hemodialysis Comments Pt. voice no complaints, and VSS. Report called to 6E RN Exie Parody, Admin Medication per order, UF goal met without difficulties.   Received patient in bed to unit.  Alert and oriented.  Informed consent signed and in chart.   TX duration: 3.75  Patient tolerated well.  Transported back to the room  Alert, without acute distress.  Hand-off given to patient's nurse.   Access used: Yes Access issues: No  Total UF removed: 2500 Medication(s) given: See MAR Post HD VS: See Above Grid Post HD weight: 71.3 kg Standing weight   [name]

## 2022-10-26 DIAGNOSIS — I330 Acute and subacute infective endocarditis: Secondary | ICD-10-CM | POA: Diagnosis not present

## 2022-10-26 DIAGNOSIS — I4719 Other supraventricular tachycardia: Secondary | ICD-10-CM | POA: Diagnosis not present

## 2022-10-26 DIAGNOSIS — J189 Pneumonia, unspecified organism: Secondary | ICD-10-CM | POA: Diagnosis not present

## 2022-10-26 DIAGNOSIS — N186 End stage renal disease: Secondary | ICD-10-CM | POA: Diagnosis not present

## 2022-10-26 DIAGNOSIS — T8612 Kidney transplant failure: Secondary | ICD-10-CM | POA: Diagnosis not present

## 2022-10-26 DIAGNOSIS — J9601 Acute respiratory failure with hypoxia: Secondary | ICD-10-CM | POA: Diagnosis not present

## 2022-10-26 DIAGNOSIS — I2690 Septic pulmonary embolism without acute cor pulmonale: Secondary | ICD-10-CM | POA: Diagnosis not present

## 2022-10-26 DIAGNOSIS — I953 Hypotension of hemodialysis: Secondary | ICD-10-CM | POA: Diagnosis not present

## 2022-10-26 DIAGNOSIS — I5033 Acute on chronic diastolic (congestive) heart failure: Secondary | ICD-10-CM | POA: Diagnosis not present

## 2022-10-26 DIAGNOSIS — I9589 Other hypotension: Secondary | ICD-10-CM | POA: Diagnosis not present

## 2022-10-26 DIAGNOSIS — I76 Septic arterial embolism: Secondary | ICD-10-CM | POA: Diagnosis not present

## 2022-10-26 DIAGNOSIS — E440 Moderate protein-calorie malnutrition: Secondary | ICD-10-CM | POA: Diagnosis not present

## 2022-10-26 NOTE — Progress Notes (Addendum)
Progress Note   Patient: Ryan [name] ION:[id number] DOB: 02/08/1961 DOA: 10/09/2022     17 DOS: the patient was seen and examined on 10/26/2022   Brief hospital course: Mr. Ryan [name] was admitted to the hospital with the working diagnosis of acute hypoxemic respiratory failure due to pneumonia in the setting of endocarditis with septic emboli.   62 yo male with the past medical history of hypertension, ESRD, and hyperlipidemia who was transferred from CIR to inpatient setting due to suspected new onset atrial fibrillation with RVR. Recent hospitalization 06/16 to 07/04 for mitral valve endocarditis, MSSA bacteremia with brain septic emboli.  One week after his discharge while in CIR he was diagnosed with pneumonia, and worsening anemia. On 07/18 he developed suspected atrial fibrillation prompting his transfer back to inpatient setting.  On his initial physical examination his blood pressure was 103/72, HR 101, RR 20 and temp 97.7, lungs with bibasilar rhonchi, heart with S1 and S2 present and irregular, abdomen with no distention and no lower extremity edema.   Na 124, K 3,9 Cl 88, bicarbonate 24, glucose 115 bun 40 cr 7,42 Wbc 14.3 hgb 7.7 plt 377  TSH 8,9   Chest radiograph with cardiomegaly, bilateral interstitial infiltrates symmetric and central, bilateral hilar vascular congestion. HD catheter in the left internal jugular vein with tip in the SVC.   EKG 135 bpm, normal axis, normal intervals, sinus rhythm with 1st degree AV block and PAC, no significant ST segment or T wave changes.   7/18 - Readmitted to [facility] for Afib RVR/SOB requiring BiPAP, inability to complete HD. 7/22 - Off of BiPAP, tolerating Salter. Remains anxious. Tolerating CRRT. 7/24 patient on sinus rhythm and off CRRT.  07/25 transferred to [facility].   08/04 patient with improvement in volume status, tolerating renal replacement therapy with ultrafiltration.  Has been placed on midodrine for blood pressure support.   Pending transfer back to CIR to continue physical therapy.      Assessment and Plan: * Multifocal atrial tachycardia Patient required diltiazem infusion, now he has been transitioned to oral metoprolol with good toleration.  Telemetry personally reviewed with features of MAT.  Currently he is off anticoagulation.  Will check EKG to rule out atrial fibrillation, note cardiology consultation from 07.18 concluded no atrial fibrillation.   CAP (community acquired pneumonia) Acute hypoxemic respiratory failure.   Oxygenation has improved. Plan to continue antibiotic therapy until 08/13 with cefazolin.  Continue bronchodilator therapy.  ESRD on dialysis (HCC) Improved volume status, continue renal replacement therapy with intermittent HD.  Anemia of chronic renal disease.  Follow up hgb is 9.5    Acute and subacute infective endocarditis in diseases classified elsewhere MSSA bacteremia with embolization to the brain and lungs. Plan to continue with IV cefazolin.   L4 and S1 discitis and osteomyelitis, will need antibiotic therapy with IV cephalosporin until 08/13.    Hyperlipidemia Continue statin therapy.   Hypertension Hypotension.  Systolic blood pressure 93 to 97 mmHg.  Plan to continue midodrine.    Moderate protein malnutrition (HCC) Continue nutritional supplementation,.         Subjective: patient today with back pain, intermittent, controlled with analgesics. No chest pain or dyspnea.   Physical Exam: Vitals:   10/26/22 0609 10/26/22 0817 10/26/22 0840 10/26/22 1124  BP: 97/66  (!) 86/62 (P) 93/75  Pulse: (!) 116   (!) (P) 111  Resp:      Temp:   97.8 F (36.6 C) (P) 97.8 F (36.6 C)  TempSrc:  Oral (P) Oral  SpO2:  98%    Weight: 72.5 kg     Height:       Neurology awake and alert ENT with mild pallor Cardiovascular with S1 and S2 present and irregular with positive systolic murmur at the apex Respiratory with no rales or wheezing, no  rhonchi Abdomen with no distention  No lower extremity edema  Data Reviewed:    Family Communication: no family at the bedside   Disposition: Status is: Inpatient Remains inpatient appropriate because: pending transfer to CIR   Planned Discharge Destination: Rehab      Author: Coralie Keens, MD 10/26/2022 12:22 PM  For on call review [email or web address].

## 2022-10-26 NOTE — Progress Notes (Signed)
Subjective:  Co dry cough originating in throat today -- already on PPI.  C/o lumbar back pain off/on -- seemed positional at HD yesterday, still occurring some.  No fevers, chills.  Breathing improved.   Objective Vital signs in last 24 hours: Vitals:   10/26/22 0609 10/26/22 0817 10/26/22 0840 10/26/22 1124  BP: 97/66  (!) 86/62 93/75  Pulse: (!) 116   (!) 111  Resp:      Temp:   97.8 F (36.6 C) 97.8 F (36.6 C)  TempSrc:   Oral Oral  SpO2:  98%    Weight: 72.5 kg     Height:       Weight change:   Physical Exam: General: Chronically ill male NAD Heart: Ireg, irreg  hrt rt stable now  Lungs: CTA, nonlabored breathing , 2 l [place]  Abdomen: NABS, soft nontender no ascites nondistended Extremities: no edema Dialysis Access: TDC     OP Dialysis Orders: TTS at [facility] 3:45hr, 450/A1.5, EDW 85.7kg, 2K/2Ca bath, TDC, heparin 3000 unit bolus - no ESA - Calcitriol 1.23mcg PO q HD     Problem/Plan: MSSA bacteremia/endocarditis/L5-S1 discitis: Blood Cx 6/16 MSSA, negative Cx on 6/17, 6/19. S/p TDC line holiday. TTE was concerning for endocarditis. TEE cancelled d/t high risk factors. LS MRI with possible L5-S1 discitis. On IV Cefazolin 2g q HD x 6 weeks (until 11/04/22). BP/Volume overload: Hypotension preventing adequate UF. Marland Kitchen Required ICU transfer for CRRT 7/21 - 10/15/22, able to get down to ~ 74kg range. Weight back up to 79kg on 7/26 - dialyzed but unable to get any fluid off due to hypotension and intermittent tachycardia. No effect with IV albumin and midodrine 10mg  tid  Metoprolol dose reduced from 50mg  TID -> 25mg  TID. Fluid restrictions disc /  Have increased midodine  to 15mg   now tolerating UF with treatments,  HD next Tues per usual schedule Deconditioning /weakness: was in CIR, was transferred back to 6E due to  medical complications now awaiting transfer back CIR  ESRD: . Usual TTS schedule, see abv Access: TDC pulled 6/18 for line holiday, then new LIJ TDC placed 7/01 per IR. For  perm access: s/p L AVF ligation in 2019. Last seen by Dr. Edilia Bo 01/2022 - limited mobility in R arm so planning for L arm graft but had not scheduled surgery yet. Can revisit as an outpatient once bacteremia resolved.  Anemia (ESRD + ABLA, + FOBT): Hgb 10.0  - stable for now. Continue Aranesp q Thursday.   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 Hold VDRA. Nutrition - Renal diet, Alb low - continue supplement    Estill Bakes MD [facility] Pager [phone]   Labs: Basic Metabolic Panel: Recent Labs  Lab 10/23/22 0043 10/24/22 0026 10/25/22 0414  NA 126* 128* 123*  K 4.0 3.8 4.2  CL 86* 89* 87*  CO2 21* 23 21*  GLUCOSE 110* 97 103*  BUN 57* 39* 66*  CREATININE 5.43* 3.99* 5.79*  CALCIUM 9.1 8.9 9.1   Liver Function Tests: No results for input(s): "AST", "ALT", "ALKPHOS", "BILITOT", "PROT", "ALBUMIN" in the last 168 hours. No results for input(s): "LIPASE", "AMYLASE" in the last 168 hours. No results for input(s): "AMMONIA" in the last 168 hours. CBC: Recent Labs  Lab 10/23/22 0043 10/24/22 0026 10/25/22 0414  WBC 13.5* 12.1* 11.0*  HGB 10.0* 10.4* 9.5*  HCT 33.2* 34.3* 31.0*  MCV 85.1 84.9 80.7  PLT 227 238 244   Cardiac Enzymes:  No results for input(s): "CKTOTAL", "CKMB", "CKMBINDEX", "TROPONINI" in the last 168 hours. CBG: No results for input(s): "GLUCAP" in the last 168 hours.  Studies/Results: No results found. Medications:  sodium chloride 10 mL/hr at 10/18/22 2141    ceFAZolin (ANCEF) IV 2 g (10/25/22 1721)    (feeding supplement) PROSource Plus  30 mL Oral TID WC   arformoterol  15 mcg Nebulization BID   Chlorhexidine Gluconate Cloth  6 each Topical Q0600   Darbepoetin Alfa  150 mcg Subcutaneous Q Thu-1800   feeding supplement (NEPRO CARB STEADY)  237 mL Oral TID BM   heparin  5,000 Units Subcutaneous Q8H   metoprolol tartrate  25 mg Oral Q8H   midodrine  15 mg Oral TID WC   multivitamin  1 tablet Oral QHS   mouth  rinse  15 mL Mouth Rinse 4 times per day   pantoprazole  40 mg Oral Daily   revefenacin  175 mcg Nebulization Daily

## 2022-10-27 ENCOUNTER — Inpatient Hospital Stay (HOSPITAL_COMMUNITY): Payer: No Typology Code available for payment source

## 2022-10-27 DIAGNOSIS — D631 Anemia in chronic kidney disease: Secondary | ICD-10-CM | POA: Diagnosis not present

## 2022-10-27 DIAGNOSIS — N289 Disorder of kidney and ureter, unspecified: Secondary | ICD-10-CM | POA: Diagnosis not present

## 2022-10-27 DIAGNOSIS — Z992 Dependence on renal dialysis: Secondary | ICD-10-CM | POA: Diagnosis not present

## 2022-10-27 DIAGNOSIS — B9561 Methicillin susceptible Staphylococcus aureus infection as the cause of diseases classified elsewhere: Secondary | ICD-10-CM | POA: Diagnosis not present

## 2022-10-27 DIAGNOSIS — R0989 Other specified symptoms and signs involving the circulatory and respiratory systems: Secondary | ICD-10-CM | POA: Diagnosis not present

## 2022-10-27 DIAGNOSIS — I517 Cardiomegaly: Secondary | ICD-10-CM | POA: Diagnosis not present

## 2022-10-27 DIAGNOSIS — R7881 Bacteremia: Secondary | ICD-10-CM | POA: Diagnosis not present

## 2022-10-27 DIAGNOSIS — N186 End stage renal disease: Secondary | ICD-10-CM | POA: Diagnosis not present

## 2022-10-27 DIAGNOSIS — N250 Renal osteodystrophy: Secondary | ICD-10-CM | POA: Diagnosis not present

## 2022-10-27 DIAGNOSIS — I120 Hypertensive chronic kidney disease with stage 5 chronic kidney disease or end stage renal disease: Secondary | ICD-10-CM | POA: Diagnosis not present

## 2022-10-27 LAB — RENAL FUNCTION PANEL
Albumin: 2.8 g/dL — ABNORMAL LOW (ref 3.5–5.0)
Anion gap: 19 — ABNORMAL HIGH (ref 5–15)
BUN: 74 mg/dL — ABNORMAL HIGH (ref 8–23)
CO2: 20 mmol/L — ABNORMAL LOW (ref 22–32)
Calcium: 9.4 mg/dL (ref 8.9–10.3)
Chloride: 82 mmol/L — ABNORMAL LOW (ref 98–111)
Creatinine, Ser: 6.25 mg/dL — ABNORMAL HIGH (ref 0.61–1.24)
GFR, Estimated: 9 mL/min — ABNORMAL LOW (ref >60)
Glucose, Bld: 121 mg/dL — ABNORMAL HIGH (ref 70–99)
Phosphorus: 6.2 mg/dL — ABNORMAL HIGH (ref 2.5–4.6)
Potassium: 5 mmol/L (ref 3.5–5.1)
Sodium: 121 mmol/L — ABNORMAL LOW (ref 135–145)

## 2022-10-27 LAB — CBC
HCT: 30.3 % — ABNORMAL LOW (ref 39.0–52.0)
Hemoglobin: 9.3 g/dL — ABNORMAL LOW (ref 13.0–17.0)
MCH: 24.7 pg — ABNORMAL LOW (ref 26.0–34.0)
MCHC: 30.7 g/dL (ref 30.0–36.0)
MCV: 80.4 fL (ref 80.0–100.0)
Platelets: 285 10*3/uL (ref 150–400)
RBC: 3.77 MIL/uL — ABNORMAL LOW (ref 4.22–5.81)
RDW: 18.8 % — ABNORMAL HIGH (ref 11.5–15.5)
WBC: 11.3 10*3/uL — ABNORMAL HIGH (ref 4.0–10.5)
nRBC: 0.2 % (ref 0.0–0.2)

## 2022-10-27 MED ORDER — PENTAFLUOROPROP-TETRAFLUOROETH EX AERO: 1.0000 | INHALATION_SPRAY | CUTANEOUS | Status: DC | PRN

## 2022-10-27 MED ORDER — HEPARIN SODIUM (PORCINE) 1000 UNIT/ML IJ SOLN
INTRAMUSCULAR | Status: AC
  Filled 2022-10-27: qty 4

## 2022-10-27 MED ORDER — LIDOCAINE HCL (PF) 1 % IJ SOLN: 5.0000 mL | INTRAMUSCULAR | Status: DC | PRN

## 2022-10-27 MED ORDER — HEPARIN SODIUM (PORCINE) 1000 UNIT/ML DIALYSIS
1000.0000 [IU] | INTRAMUSCULAR | Status: DC | PRN
  Administered 2022-10-27: 1000 [IU] via INTRAVENOUS_CENTRAL
  Filled 2022-10-27 (×2): qty 1

## 2022-10-27 MED ORDER — ALTEPLASE 2 MG IJ SOLR: 2.0000 mg | Freq: Once | INTRAMUSCULAR | Status: DC | PRN

## 2022-10-27 MED ORDER — LIDOCAINE-PRILOCAINE 2.5-2.5 % EX CREA: 1.0000 | TOPICAL_CREAM | CUTANEOUS | Status: DC | PRN

## 2022-10-27 NOTE — Progress Notes (Addendum)
Progress Note   Patient: Ryan [name] ZOX:[id number] DOB: [birth year] DOA: 10/09/2022     18 DOS: the patient was seen and examined on 10/27/2022   Brief hospital course: Ryan [name] was admitted to the hospital with the working diagnosis of acute hypoxemic respiratory failure due to pneumonia in the setting of endocarditis with septic emboli.   62 yo male with the past medical history of hypertension, ESRD, and hyperlipidemia who was transferred from CIR to inpatient setting due to suspected new onset atrial fibrillation with RVR. Recent hospitalization 06/16 to 07/04 for mitral valve endocarditis, MSSA bacteremia with brain septic emboli.  One week after his discharge while in CIR he was diagnosed with pneumonia, and worsening anemia. On 07/18 he developed suspected atrial fibrillation prompting his transfer back to inpatient setting.  On his initial physical examination his blood pressure was 103/72, HR 101, RR 20 and temp 97.7, lungs with bibasilar rhonchi, heart with S1 and S2 present and irregular, abdomen with no distention and no lower extremity edema.   Na 124, K 3,9 Cl 88, bicarbonate 24, glucose 115 bun 40 cr 7,42 Wbc 14.3 hgb 7.7 plt 377  TSH 8,9   Chest radiograph with cardiomegaly, bilateral interstitial infiltrates symmetric and central, bilateral hilar vascular congestion. HD catheter in the left internal jugular vein with tip in the SVC.   EKG 135 bpm, normal axis, normal intervals, sinus rhythm with 1st degree AV block and PAC, no significant ST segment or T wave changes.   7/18 - [facility] for Afib RVR/SOB requiring BiPAP, inability to complete HD. 7/22 - Off of BiPAP, tolerating [place]. Remains anxious. Tolerating CRRT. 7/24 patient on sinus rhythm and off CRRT.  07/25 transferred to [facility]   08/04 patient with improvement in volume status, tolerating renal replacement therapy with ultrafiltration.  Has been placed on midodrine for blood pressure support.   Pending transfer back to CIR to continue physical therapy.      Assessment and Plan: * Multifocal atrial tachycardia Patient required diltiazem infusion, now he has been transitioned to oral metoprolol with good toleration.  Telemetry personally reviewed with features of MAT, today in sinus rhythm.   Ruled out atrial fibrillation, (note from cardiology consultation from 07.18 concluded no atrial fibrillation).   08/04 EKG 106 bpm, normal axis, normal intervals, sinus rhythm with PAC, no significant ST segment or T wave changes.   CAP (community acquired pneumonia) Acute hypoxemic respiratory failure.   Oxygenation has improved. Plan to continue antibiotic therapy until 08/13 with cefazolin.  Continue bronchodilator therapy.  ESRD on dialysis (HCC) Hyponatremia.   Today serum Na is 119, BUM is 66 and anion gap 17.  His wight is up form 08/03, 2 kg.  Plan for  HD tomorrow per nephrology, now he is on midodrine for blood pressure support. May need HD today for volume management will check with nephrology.    Anemia of chronic renal disease.  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 Plan to continue with IV cefazolin.   L4 and S1 discitis and osteomyelitis, will need antibiotic therapy with IV cephalosporin until 08/13.    Hyperlipidemia Continue statin therapy.   Hypertension Hypotension.  Systolic blood pressure 96 to 811 mmHg.  Plan to continue midodrine 15 mg tid.   Echocardiogram with preserved LV systolic function EF 55 to 60%, moderate LVH, RV systolic function preserved, LA with mild dilatation,  large rounded structure on the anterior leflet of the mitral valve, partially obstructing mitral inflow with ball-valve type motion. No mitral stenosis or mitral regurgitation. Mild aortic valve stenosis.    Moderate protein malnutrition (HCC) Continue  nutritional supplementation,.         Subjective: patient not feeling very well this morning, no chest pain or dyspnea.   Physical Exam: Vitals:   10/26/22 2330 10/27/22 0456 10/27/22 0457 10/27/22 0558  BP: 110/71 96/62  102/73  Pulse: (!) 103 (!) 101 (!) 101 (!) 102  Resp: (!) 27 16 16    Temp: 97.6 F (36.4 C) (!) 97 F (36.1 C)    TempSrc: Oral Oral    SpO2: 90% 91% 95%   Weight:  73.9 kg    Height:       Neurology awake and alert. ENT with mild pallor Cardiovascular with S1 and S2 present, and regular with no gallops, or rubs Respiratory with no rales or wheezing Abdomen with no distention  No lower extremity edema  Data Reviewed:    Family Communication: no family at the bedside   Disposition: Status is: Inpatient Remains inpatient appropriate because: pending transfer to CIR   Planned Discharge Destination: Rehab     Author: Coralie Keens, MD 10/27/2022 9:39 AM  For on call review [email or web address].

## 2022-10-27 NOTE — Progress Notes (Signed)
Date and time results received: 10/27/22 05:30  Test: Sodium Critical Value: 119  Name of Provider Notified: Rathore paged @ 05:34  Orders Received? Or Actions Taken? No new orders received

## 2022-10-27 NOTE — Progress Notes (Signed)
[facility] Progress Note   Subjective:    Seen and examined patient at bedside. Noted Na level of 119 today. Na levels trending down altogether. Patient's resp status stable earlier this morning but now appears to have changed: he's more fatigue and somnolent currently. Also informed current weight is up by 2kg. Discussed with both Dr. Arlean Hopping and Hospitalist. Repeating another CXR today and plan for an extra treatment today and resume HD tomorrow per his usual schedule.  Objective Vitals:   10/26/22 2330 10/27/22 0456 10/27/22 0457 10/27/22 0558  BP: 110/71 96/62  102/73  Pulse: (!) 103 (!) 101 (!) 101 (!) 102  Resp: (!) 27 16 16    Temp: 97.6 F (36.4 C) (!) 97 F (36.1 C)    TempSrc: Oral Oral    SpO2: 90% 91% 95%   Weight:  73.9 kg    Height:       Physical Exam General: Chronically ill male; appears fatigue Heart: Ireg, irreg  hrt rt stable now  Lungs: Mildly tachypneic; Clear anteriorly, diminished bilateral lower lobes and fine rales noted R mid-lobe , 2 L [place]  Abdomen: NABS, soft nontender no ascites nondistended Extremities: no edema Dialysis Access: [facility]     Filed Weights   10/25/22 1235 10/26/22 0609 10/27/22 0456  Weight: 71.3 kg 72.5 kg 73.9 kg    Intake/Output Summary (Last 24 hours) at 10/27/2022 1040 Last data filed at 10/26/2022 2359 Gross per 24 hour  Intake 0 ml  Output --  Net 0 ml    Additional Objective Labs: Basic Metabolic Panel: Recent Labs  Lab 10/24/22 0026 10/25/22 0414 10/27/22 0409  NA 128* 123* 119*  K 3.8 4.2 4.6  CL 89* 87* 81*  CO2 23 21* 21*  GLUCOSE 97 103* 124*  BUN 39* 66* 66*  CREATININE 3.99* 5.79* 5.77*  CALCIUM 8.9 9.1 9.2   Liver Function Tests: No results for input(s): "AST", "ALT", "ALKPHOS", "BILITOT", "PROT", "ALBUMIN" in the last 168 hours. No results for input(s): "LIPASE", "AMYLASE" in the last 168 hours. CBC: Recent Labs  Lab 10/23/22 0043 10/24/22 0026 10/25/22 0414 10/27/22 0409  WBC  13.5* 12.1* 11.0* 11.7*  HGB 10.0* 10.4* 9.5* 9.5*  HCT 33.2* 34.3* 31.0* 30.3*  MCV 85.1 84.9 80.7 79.5*  PLT 227 238 244 283   Blood Culture    Component Value Date/Time   SDES BLOOD BLOOD RIGHT ARM 10/05/2022 2032   SPECREQUEST  10/05/2022 2032    BOTTLES DRAWN AEROBIC AND ANAEROBIC Blood Culture adequate volume   CULT  10/05/2022 2032    NO GROWTH 5 DAYS Performed at [facility], [address], [place], [place] [address]    REPTSTATUS 10/10/2022 FINAL 10/05/2022 2032    Cardiac Enzymes: No results for input(s): "CKTOTAL", "CKMB", "CKMBINDEX", "TROPONINI" in the last 168 hours. CBG: No results for input(s): "GLUCAP" in the last 168 hours. Iron Studies: No results for input(s): "IRON", "TIBC", "TRANSFERRIN", "FERRITIN" in the last 72 hours. Lab Results  Component Value Date   INR 1.4 (H) 10/10/2022   INR 1.4 (H) 09/10/2022   INR 1.13 09/23/2017   Studies/Results: No results found.  Medications:  sodium chloride 10 mL/hr at 10/18/22 2141    ceFAZolin (ANCEF) IV 2 g (10/25/22 1721)    (feeding supplement) PROSource Plus  30 mL Oral TID WC   arformoterol  15 mcg Nebulization BID   Chlorhexidine Gluconate Cloth  6 each Topical Q0600   Darbepoetin Alfa  150 mcg Subcutaneous Q Thu-1800  feeding supplement (NEPRO CARB STEADY)  237 mL Oral TID BM   heparin  5,000 Units Subcutaneous Q8H   metoprolol tartrate  25 mg Oral Q8H   midodrine  15 mg Oral TID WC   multivitamin  1 tablet Oral QHS   mouth rinse  15 mL Mouth Rinse 4 times per day   pantoprazole  40 mg Oral Daily   revefenacin  175 mcg Nebulization Daily    Dialysis Orders: TTS at [facility] 3:45hr, 450/A1.5, EDW 85.7kg, 2K/2Ca bath, TDC, heparin 3000 unit bolus - no ESA - Calcitriol 1.62mcg PO q HD  Assessment/Plan: MSSA bacteremia/endocarditis/L5-S1 discitis: Blood Cx 6/16 MSSA, negative Cx on 6/17, 6/19. S/p TDC line holiday. TTE was concerning for endocarditis. TEE cancelled d/t high risk factors. LS MRI  with possible L5-S1 discitis. On IV Cefazolin 2g q HD x 6 weeks (until 11/04/22). Hyponatremia: Noted Na levels down trending. Patient appears fatigue/somnolent today. Repeating labs today. See below for detailed plan. BP/Volume overload: Hypotension preventing adequate UF. Marland Kitchen Required ICU transfer for CRRT 7/21 - 10/15/22, able to get down to ~ 74kg range. Weight back up to 79kg on 7/26 - dialyzed but unable to get any fluid off due to hypotension and intermittent tachycardia. No effect with IV albumin and midodrine 10mg  tid  Metoprolol dose reduced from 50mg  TID -> 25mg  TID. Fluid restrictions disc /  midodine raised  to 15mg   tolerating UF with treatments. Current weight is up by 2kg. Discussed with Dr. Arlean Hopping: repeating CXR and will plan for extra HD today and will resume his schedule HD tomorrow as well. Deconditioning /weakness: was in CIR, was transferred back to 6E due to  medical complications now awaiting transfer back CIR  ESRD: . Usual TTS schedule, see above Access: TDC pulled 6/18 for line holiday, then new LIJ TDC placed 7/01 per IR. For perm access: s/p L AVF ligation in 2019. Last seen by Dr. Edilia Bo 01/2022 - limited mobility in R arm so planning for L arm graft but had not scheduled surgery yet. Can revisit as an outpatient once bacteremia resolved.  Anemia (ESRD + ABLA, + FOBT): Hgb 9.5. Continue Aranesp q Thursday.   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 Hold VDRA. Nutrition - Renal diet, Alb low - continue supplement  Salome Holmes, NP [facility] 10/27/2022,10:40 AM  LOS: 18 days

## 2022-10-27 NOTE — Progress Notes (Signed)
Inpatient Rehab Admissions Coordinator:   Discussed with Dr. Wynn Banker.  Unable to admit to CIR today with sodium 119.  We will continue to follow.   Estill Dooms, PT, DPT Admissions Coordinator [phone] 10/27/22  11:02 AM

## 2022-10-27 NOTE — Progress Notes (Signed)
Received patient midway to his treatment ,was sitting in recliner.Alert and oriented x 4.  Left HD catheter worked well.Dressing changed done today.  Duration of treatment: 3.25 hours.  Fluid removed : Met  2 liters goal.  Hemo comment: Tolerated treatment:  Hand off to the patient's nurse.

## 2022-10-27 NOTE — Progress Notes (Signed)
Physical Therapy Treatment Patient Details Name: Ryan [name] MRN: [id number] DOB: 12-Feb-1961 Today's Date: 10/27/2022   History of Present Illness 62 yo male presents to [facility] from CIR on 7/18 for afib RVR, aspiration PNA. RR on 7/21 for acute respiratory distress due to pulmonary edema requiring bipap and CRRT, and transfer to ICU. 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 S/p  HD cath L subclavian on 7/1. PMH includes HTN, HLD, ESRD on HD TTS.    PT Comments  Pt reports not feeling well today, states he feels short of breath and fatigued. SPO2 98-99% throughout session, but pt does get short of breath with RR up to 45 breaths/min, HR stable in low 100s. Pt ambulatory in hallway with rollator and frequent seated rest breaks for fatigue management, overall requiring close guard for safety and intermittent form/safety cues. Plan remains appropriate.    If plan is discharge home, recommend the following: Help with stairs or ramp for entrance;Assist for transportation;Assistance with cooking/housework;A little help with walking and/or transfers;A little help with bathing/dressing/bathroom   Can travel by private Psychologist, clinical (4 wheels)    Recommendations for Other Services       Precautions / Restrictions Precautions Precautions: Fall Precaution Comments: watch for hypotension Required Braces or Orthoses: Spinal Brace Spinal Brace: Lumbar corset;Other (comment) Spinal Brace Comments: for comfort     Mobility  Bed Mobility Overal bed mobility: Needs Assistance Bed Mobility: Rolling, Sidelying to Sit, Sit to Sidelying Rolling: Supervision Sidelying to sit: Supervision     Sit to sidelying: Supervision General bed mobility comments: for safety, cues for sequencing with log roll technique. increased time     Transfers Overall transfer level: Needs assistance Equipment used: Rollator (4 wheels) Transfers: Sit to/from Stand Sit to Stand: Min guard           General transfer comment: for safety, slow to rise and steady. stand x3 from EOB and rollator.    Ambulation/Gait Ambulation/Gait assistance: Min guard Gait Distance (Feet): 80 Feet (+35+45) Assistive device: Rollator (4 wheels) Gait Pattern/deviations: Step-through pattern, Trunk flexed, Decreased stride length Gait velocity: decr     General Gait Details: slowed, requires increased time and intermittent cues for hallway navigation/rollator use. x2 seated rest breaks   Stairs             Wheelchair Mobility     Tilt Bed    Modified Rankin (Stroke Patients Only)       Balance Overall balance assessment: Needs assistance Sitting-balance support: No upper extremity supported, Feet supported Sitting balance-Leahy Scale: Good     Standing balance support: Bilateral upper extremity supported, Reliant on assistive device for balance Standing balance-Leahy Scale: Poor                              Cognition Arousal/Alertness: Awake/alert Behavior During Therapy: WFL for tasks assessed/performed Overall Cognitive Status: Within Functional Limits for tasks assessed                                          Exercises      General Comments General comments (skin integrity, edema, etc.): BP 100s/60s throughout session in both supine and post-gait  Pertinent Vitals/Pain Pain Assessment Pain Assessment: Faces Faces Pain Scale: No hurt Pain Intervention(s): Monitored during session    Home Living                          Prior Function            PT Goals (current goals can now be found in the care plan section) Acute Rehab PT Goals Patient Stated Goal: back to rehab to get stronger PT Goal Formulation: With patient Time For Goal Achievement:  11/03/22 Potential to Achieve Goals: Good Progress towards PT goals: Progressing toward goals    Frequency    Min 1X/week      PT Plan Current plan remains appropriate    Co-evaluation              AM-PAC PT "6 Clicks" Mobility   Outcome Measure  Help needed turning from your back to your side while in a flat bed without using bedrails?: A Little Help needed moving from lying on your back to sitting on the side of a flat bed without using bedrails?: A Little Help needed moving to and from a bed to a chair (including a wheelchair)?: A Little Help needed standing up from a chair using your arms (e.g., wheelchair or bedside chair)?: A Little Help needed to walk in hospital room?: A Little Help needed climbing 3-5 steps with a railing? : A Little 6 Click Score: 18    End of Session Equipment Utilized During Treatment: Back brace Activity Tolerance: Patient limited by fatigue Patient left: with call bell/phone within reach;in bed;with bed alarm set Nurse Communication: Mobility status PT Visit Diagnosis: Other abnormalities of gait and mobility (R26.89);Muscle weakness (generalized) (M62.81)     Time: 1035-1101 PT Time Calculation (min) (ACUTE ONLY): 26 min  Charges:    $Gait Training: 8-22 mins $Therapeutic Activity: 8-22 mins PT General Charges $$ ACUTE PT VISIT: 1 Visit                     Marye Round, PT DPT Acute Rehabilitation Services Secure Chat Preferred  Office [phone]     Sheliah Plane 10/27/2022, 12:28 PM

## 2022-10-28 ENCOUNTER — Inpatient Hospital Stay: Payer: No Typology Code available for payment source | Admitting: Internal Medicine

## 2022-10-28 DIAGNOSIS — E871 Hypo-osmolality and hyponatremia: Secondary | ICD-10-CM | POA: Insufficient documentation

## 2022-10-28 DIAGNOSIS — R7881 Bacteremia: Secondary | ICD-10-CM | POA: Diagnosis not present

## 2022-10-28 DIAGNOSIS — E785 Hyperlipidemia, unspecified: Secondary | ICD-10-CM | POA: Diagnosis not present

## 2022-10-28 DIAGNOSIS — I4719 Other supraventricular tachycardia: Secondary | ICD-10-CM | POA: Diagnosis not present

## 2022-10-28 DIAGNOSIS — I39 Endocarditis and heart valve disorders in diseases classified elsewhere: Secondary | ICD-10-CM | POA: Diagnosis not present

## 2022-10-28 DIAGNOSIS — I10 Essential (primary) hypertension: Secondary | ICD-10-CM | POA: Diagnosis not present

## 2022-10-28 DIAGNOSIS — Z992 Dependence on renal dialysis: Secondary | ICD-10-CM | POA: Diagnosis not present

## 2022-10-28 DIAGNOSIS — I339 Acute and subacute endocarditis, unspecified: Secondary | ICD-10-CM | POA: Diagnosis not present

## 2022-10-28 DIAGNOSIS — E440 Moderate protein-calorie malnutrition: Secondary | ICD-10-CM | POA: Diagnosis not present

## 2022-10-28 DIAGNOSIS — N186 End stage renal disease: Secondary | ICD-10-CM | POA: Diagnosis not present

## 2022-10-28 DIAGNOSIS — E8770 Fluid overload, unspecified: Secondary | ICD-10-CM

## 2022-10-28 DIAGNOSIS — J189 Pneumonia, unspecified organism: Secondary | ICD-10-CM | POA: Diagnosis not present

## 2022-10-28 MED ORDER — BENZONATATE 100 MG PO CAPS
200.0000 mg | ORAL_CAPSULE | Freq: Three times a day (TID) | ORAL | Status: DC
  Administered 2022-10-28 – 2022-11-02 (×13): 200 mg via ORAL
  Filled 2022-10-28 (×13): qty 2

## 2022-10-28 MED ORDER — LIDOCAINE HCL (PF) 1 % IJ SOLN: 5.0000 mL | INTRAMUSCULAR | Status: DC | PRN

## 2022-10-28 MED ORDER — ALBUMIN HUMAN 25 % IV SOLN
25.0000 g | Freq: Once | INTRAVENOUS | Status: AC
  Administered 2022-10-28: 25 g via INTRAVENOUS

## 2022-10-28 MED ORDER — PENTAFLUOROPROP-TETRAFLUOROETH EX AERO: 1.0000 | INHALATION_SPRAY | CUTANEOUS | Status: DC | PRN

## 2022-10-28 MED ORDER — ALBUMIN HUMAN 25 % IV SOLN
INTRAVENOUS | Status: AC
  Filled 2022-10-28: qty 100

## 2022-10-28 MED ORDER — HEPARIN SODIUM (PORCINE) 1000 UNIT/ML IJ SOLN
INTRAMUSCULAR | Status: AC
  Administered 2022-10-28: 3000 [IU]
  Filled 2022-10-28: qty 3

## 2022-10-28 MED ORDER — HEPARIN SODIUM (PORCINE) 1000 UNIT/ML DIALYSIS
1000.0000 [IU] | INTRAMUSCULAR | Status: DC | PRN
  Administered 2022-10-28: 3800 [IU] via INTRAVENOUS_CENTRAL

## 2022-10-28 MED ORDER — MIDODRINE HCL 5 MG PO TABS: 15.0000 mg | ORAL_TABLET | Freq: Once | ORAL | Status: AC

## 2022-10-28 MED ORDER — LIDOCAINE-PRILOCAINE 2.5-2.5 % EX CREA: 1.0000 | TOPICAL_CREAM | CUTANEOUS | Status: DC | PRN

## 2022-10-28 MED ORDER — ALTEPLASE 2 MG IJ SOLR: 2.0000 mg | Freq: Once | INTRAMUSCULAR | Status: DC | PRN

## 2022-10-28 MED ORDER — ALBUMIN HUMAN 25 % IV SOLN: 12.5000 g | Freq: Once | INTRAVENOUS | Status: DC

## 2022-10-28 NOTE — Progress Notes (Signed)
Received patient sitting in HD recliner.Awake,alert and oriented x 4. Consent verified.  Access used :Left HD catheter that worked well.Dressing on date.  Medicine given. Heparin 3,000 units pre-run dose.                           Midodrine 15 mg -extre dose as ordered.                            Albumin 25 g IV.  Duration of treatment : 3.5 hours.  Fluid removed : 1,700 cc out of 2 liters prescribed.  Hemo comment: Compare to yesterday treatment,he wasn't tolerating on his first hour of today's treatment ,his blood pressure still low and falling,Renal NP notified,orders done and carried out.Marland Kitchen

## 2022-10-28 NOTE — Progress Notes (Signed)
PROGRESS NOTE    Ryan [name]  QIO:[id number] DOB: [birth year] DOA: 10/09/2022 PCP: Sharin Grave, MD    No chief complaint on file.   Brief Narrative:  Ryan [name] was admitted to the hospital with the working diagnosis of acute hypoxemic respiratory failure due to pneumonia in the setting of endocarditis with septic emboli.    62 yo male with the past medical history of hypertension, ESRD, and hyperlipidemia who was transferred from CIR to inpatient setting due to suspected new onset atrial fibrillation with RVR. Recent hospitalization 06/16 to 07/04 for mitral valve endocarditis, MSSA bacteremia with brain septic emboli.  One week after his discharge while in CIR he was diagnosed with pneumonia, and worsening anemia. On 07/18 he developed suspected atrial fibrillation prompting his transfer back to inpatient setting.  On his initial physical examination his blood pressure was 103/72, HR 101, RR 20 and temp 97.7, lungs with bibasilar rhonchi, heart with S1 and S2 present and irregular, abdomen with no distention and no lower extremity edema.    Na 124, K 3,9 Cl 88, bicarbonate 24, glucose 115 bun 40 cr 7,42 Wbc 14.3 hgb 7.7 plt 377  TSH 8,9    Chest radiograph with cardiomegaly, bilateral interstitial infiltrates symmetric and central, bilateral hilar vascular congestion. HD catheter in the left internal jugular vein with tip in the SVC.    EKG 135 bpm, normal axis, normal intervals, sinus rhythm with 1st degree AV block and PAC, no significant ST segment or T wave changes.    7/18 - Readmitted to [facility] for Afib RVR/SOB requiring BiPAP, inability to complete HD. 7/22 - Off of BiPAP, tolerating Salter. Remains anxious. Tolerating CRRT. 7/24 patient on sinus rhythm and off CRRT.   07/25 transferred to [facility].   08/04 patient with improvement in volume status, tolerating renal replacement therapy with ultrafiltration.  Has been placed on midodrine for blood pressure support.   Pending transfer back to CIR to continue physical therapy.      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 -Patient in normal sinus rhythm. -Patient seen in consultation by cardiology and note from 10/09/2022 concluded no atrial fibrillation noted and also noted with first-degree AV block. -Supportive care.  2.  Acute hypoxemic respiratory failure secondary to CAP -Patient improved clinically with oxygenation. -Patient to continue antibiotics of cefazolin until 11/04/2022 to also cover for his MSSA bacteremia. -Continue bronchodilators, supportive care. -Currently with sats of 93% on room air.  3.  ESRD -On HD. -Per nephrology.  4.  Hyponatremia -Likely secondary to hypervolemic hyponatremia. -Sodium on 10/27/2022 noted at 119, BUN of 66 with anion gap of 7. -Patient noted to have weight up of 2 kg from 10/25/2022. -Patient underwent HD extra treatment on 10/27/2022 and for HD again today per his regular schedule. -Sodium levels improved currently at 125. -Repeat labs in the a.m. -Per nephrology.  5.  MSSA bacteremia/acute and subacute infective endocarditis -Patient with noted MSSA bacteremia with embolization to brain and lungs during prior hospitalization. -Continue empiric IV cefazolin as recommended by ID. -Supportive care.  6.  L4 S1  discitis and osteomyelitis -Continue IV cephalosporin until 11/04/2022 per ID recommendations from prior hospitalization.  7.  Hyperlipidemia -Statin.  8.   Hypertension/hypotension -Patient noted to be hypotensive with systolic blood pressures of 96-1 02. -Continue midodrine 15 mg 3 times daily.  9.  Volume overload -Patient noted to have hypotension which was preventing adequate UF during HD. -Patient required transfer to the ICU for CRRT 7/21-7/24. -Currently on metoprolol 50 mg 3 times daily. -Repeat chest x-ray done with mild vascular congestion.  Patient noted to have received extra hemodialysis 10/27/2022 with 2 L removed and to have hemodialysis today as per his regular schedule. -Nephrology recommending repeat chest x-ray tomorrow morning for further management.  10.  Debility -PT/OT. -Once cleared by nephrology will discharge back to CIR.  11.  Moderate protein calorie malnutrition -Continue nutritional supplementation.  12.  Cough -?  May be secondary to volume overload. -Patient on PPI. -Placed on Tessalon Perles 3 times daily.    DVT prophylaxis: Heparin Code Status: Full Family Communication: Updated patient.  No family at bedside. Disposition: To CIR once cleared by nephrology hopefully in the next 24 to 48 hours.  Status is: Inpatient Remains inpatient appropriate because: Severity of illness   Consultants:  Cardiology: Dr. Wyline Mood 10/09/2022 PCCM: Dr. Merrily Pew 10/12/2022 Nephrology  Procedures:  Significant Hospital Events: Including procedures, antibiotic start and stop dates in addition to other pertinent events   6/16 - 7/4 [facility] admission for L5-S1 osteomyelitis, MSSA bacteremia 7/4 - Admitted to CIR 7/18 - Readmitted to [facility] for Afib RVR/SOB requiring BiPAP, inability to complete HD. 7/22 - Off of BiPAP, tolerating Salter. Remains anxious. Tolerating CRRT. 7/24 BP down-trending overnight, transitioned off CRRT     Antimicrobials:  Anti-infectives (From admission, onward)    Start     Dose/Rate Route Frequency Ordered Stop   10/21/22 1800  ceFAZolin (ANCEF) IVPB 2g/100 mL premix        2 g 200 mL/hr over  30 Minutes Intravenous Every T-Th-Sa (1800) 10/21/22 1154     10/16/22 2000  ceFAZolin (ANCEF) IVPB 1 g/50 mL premix  Status:  Discontinued        1 g 100 mL/hr over 30 Minutes Intravenous Every 24 hours 10/15/22 1355 10/21/22 1154   10/12/22 1800  ceFAZolin (ANCEF) IVPB 2g/100 mL premix  Status:  Discontinued        2 g 200 mL/hr over 30 Minutes Intravenous Every 12 hours 10/12/22 1557 10/15/22 1355   10/09/22 1800  ceFAZolin (ANCEF) IVPB 2g/100 mL premix  Status:  Discontinued        2 g 200 mL/hr over 30 Minutes Intravenous Every T-Th-Sa (1800) 10/09/22 1619 10/12/22 1557         Subjective: Patient with complaints of a nonproductive cough that started recently.  Denies any chest pain.  Denies any significant shortness of breath.  No abdominal pain.  Objective: Vitals:   10/28/22 1500 10/28/22 1530 10/28/22 1557 10/28/22 1600  BP: (!) 87/68 (!) 84/61  91/74  Pulse: (!) 102 (!) 101 (!) 103 (!) 102  Resp: (!) 26 17 (!) 33 14  Temp:      TempSrc:      SpO2: 96% 100% 91% 100%  Weight:      Height:        Intake/Output Summary (Last 24 hours) at 10/28/2022 1604 Last data filed at 10/28/2022 1600 Gross per 24 hour  Intake --  Output 1217.13 ml  Net -1217.13 ml   [employer]  10/27/22 1746 10/28/22 0232 10/28/22 1441  Weight: 72.3 kg 71.6 kg 72.7 kg    Examination:  General exam: Appears calm and comfortable  Respiratory system: Clear to auscultation.  No wheezes, no crackles, no rhonchi.  Fair air movement.  Respiratory effort normal. Cardiovascular system: S1 & S2 heard, RRR. No JVD, murmurs, rubs, gallops or clicks. No pedal edema. Gastrointestinal system: Abdomen is nondistended, soft and nontender. No organomegaly or masses felt. Normal bowel sounds heard. Central nervous system: Alert and oriented. No focal neurological deficits. Extremities: Symmetric 5 x 5 power. Skin: No rashes, lesions or ulcers Psychiatry: Judgement and insight appear normal. Mood & affect  appropriate.     Data Reviewed: I have personally reviewed following labs and imaging studies  CBC: Recent Labs  Lab 10/23/22 0043 10/24/22 0026 10/25/22 0414 10/27/22 0409 10/27/22 1158  WBC 13.5* 12.1* 11.0* 11.7* 11.3*  HGB 10.0* 10.4* 9.5* 9.5* 9.3*  HCT 33.2* 34.3* 31.0* 30.3* 30.3*  MCV 85.1 84.9 80.7 79.5* 80.4  PLT 227 238 244 283 285    Basic Metabolic Panel: Recent Labs  Lab 10/21/22 2318 10/23/22 0043 10/24/22 0026 10/25/22 0414 10/27/22 0409 10/27/22 1158 10/28/22 0256  NA 129* 126* 128* 123* 119* 121* 125*  K 3.7 4.0 3.8 4.2 4.6 5.0 3.8  CL 92* 86* 89* 87* 81* 82* 87*  CO2 24 21* 23 21* 21* 20* 22  GLUCOSE 110* 110* 97 103* 124* 121* 109*  BUN 29* 57* 39* 66* 66* 74* 38*  CREATININE 3.63* 5.43* 3.99* 5.79* 5.77* 6.25* 4.03*  CALCIUM 9.0 9.1 8.9 9.1 9.2 9.4 9.1  MG 1.8 1.8 1.7  --   --   --   --   PHOS  --   --   --   --   --  6.2*  --     GFR: Estimated Creatinine Clearance: 19.2 mL/min (A) (by C-G formula based on SCr of 4.03 mg/dL (H)).  Liver Function Tests: Recent Labs  Lab 10/27/22 1158  ALBUMIN 2.8*    CBG: No results for input(s): "GLUCAP" in the last 168 hours.   No results found for this or any previous visit (from the past 240 hour(s)).       Radiology Studies: DG CHEST PORT 1 VIEW  Result Date: 10/27/2022 CLINICAL DATA:  In stage renal disease EXAM: PORTABLE CHEST 1 VIEW COMPARISON:  10/23/2022 FINDINGS: Bilateral mild interstitial thickening. Prominence of the central pulmonary vasculature. No focal consolidation. No pleural effusion or pneumothorax. Stable cardiomegaly. Dual lumen left-sided central venous catheter with the tip projecting over the right atrium. No acute osseous abnormality. IMPRESSION: 1. Cardiomegaly with mild pulmonary vascular congestion. Electronically Signed   By: Elige Ko M.D.   On: 10/27/2022 12:26        Scheduled Meds:  (feeding supplement) PROSource Plus  30 mL Oral TID WC   arformoterol   15 mcg Nebulization BID   benzonatate  200 mg Oral TID   Chlorhexidine Gluconate Cloth  6 each Topical Q0600   Darbepoetin Alfa  150 mcg Subcutaneous Q Thu-1800   feeding supplement (NEPRO CARB STEADY)  237 mL Oral TID BM   heparin  5,000 Units Subcutaneous Q8H   heparin sodium (porcine)       metoprolol tartrate  25 mg Oral Q8H   midodrine  15 mg Oral TID WC   multivitamin  1 tablet Oral QHS   mouth rinse  15 mL Mouth Rinse 4 times per day   pantoprazole  40  mg Oral Daily   revefenacin  175 mcg Nebulization Daily   Continuous Infusions:  sodium chloride 10 mL/hr at 10/18/22 2141    ceFAZolin (ANCEF) IV 2 g (10/25/22 1721)     LOS: 19 days    Time spent: 40 minutes    Ramiro Harvest, MD [facility]   To contact the attending provider between 7A-7P or the covering provider during after hours 7P-7A, please log into the web site [email or web address] and access using universal [place] password for that web site. If you do not have the password, please call the hospital operator.  10/28/2022, 4:04 PM

## 2022-10-28 NOTE — Progress Notes (Addendum)
[facility] Progress Note   Subjective:    Patient received extra HD yesterday for concern of hyponatremia/volume overload and 2L was removed. Seen and examined patient at bedside. He looks better and breathing has improved. Now on RA and Na is now 125. Scheduled for HD again today per his usual schedule.  Objective Vitals:   10/27/22 2132 10/27/22 2218 10/28/22 0232 10/28/22 0745  BP:  102/76 97/60   Pulse:  (!) 112 100   Resp:  20 16   Temp:  (!) 97.1 F (36.2 C) (!) 96 F (35.6 C)   TempSrc:  Axillary Axillary   SpO2: 100%  97% 93%  Weight:   71.6 kg   Height:       Physical Exam General: Awake, alert, on RA, NAD Heart: S1 and S2; No MRGs Lungs: Clear anteriorly and posteriorly Abdomen:Soft and non-tender Extremities:No LE edema Dialysis Access: [facility]   Filed Weights   10/27/22 0456 10/27/22 1746 10/28/22 0232  Weight: 73.9 kg 72.3 kg 71.6 kg    Intake/Output Summary (Last 24 hours) at 10/28/2022 1100 Last data filed at 10/27/2022 1732 Gross per 24 hour  Intake --  Output 2000 ml  Net -2000 ml    Additional Objective Labs: Basic Metabolic Panel: Recent Labs  Lab 10/27/22 0409 10/27/22 1158 10/28/22 0256  NA 119* 121* 125*  K 4.6 5.0 3.8  CL 81* 82* 87*  CO2 21* 20* 22  GLUCOSE 124* 121* 109*  BUN 66* 74* 38*  CREATININE 5.77* 6.25* 4.03*  CALCIUM 9.2 9.4 9.1  PHOS  --  6.2*  --    Liver Function Tests: Recent Labs  Lab 10/27/22 1158  ALBUMIN 2.8*   No results for input(s): "LIPASE", "AMYLASE" in the last 168 hours. CBC: Recent Labs  Lab 10/23/22 0043 10/24/22 0026 10/25/22 0414 10/27/22 0409 10/27/22 1158  WBC 13.5* 12.1* 11.0* 11.7* 11.3*  HGB 10.0* 10.4* 9.5* 9.5* 9.3*  HCT 33.2* 34.3* 31.0* 30.3* 30.3*  MCV 85.1 84.9 80.7 79.5* 80.4  PLT 227 238 244 283 285   Blood Culture    Component Value Date/Time   SDES BLOOD BLOOD RIGHT ARM 10/05/2022 2032   SPECREQUEST  10/05/2022 2032    BOTTLES DRAWN AEROBIC AND ANAEROBIC  Blood Culture adequate volume   CULT  10/05/2022 2032    NO GROWTH 5 DAYS Performed at [facility], [address], [place], [place] [address]    REPTSTATUS 10/10/2022 FINAL 10/05/2022 2032    Cardiac Enzymes: No results for input(s): "CKTOTAL", "CKMB", "CKMBINDEX", "TROPONINI" in the last 168 hours. CBG: No results for input(s): "GLUCAP" in the last 168 hours. Iron Studies: No results for input(s): "IRON", "TIBC", "TRANSFERRIN", "FERRITIN" in the last 72 hours. Lab Results  Component Value Date   INR 1.4 (H) 10/10/2022   INR 1.4 (H) 09/10/2022   INR 1.13 09/23/2017   Studies/Results: DG CHEST PORT 1 VIEW  Result Date: 10/27/2022 CLINICAL DATA:  In stage renal disease EXAM: PORTABLE CHEST 1 VIEW COMPARISON:  10/23/2022 FINDINGS: Bilateral mild interstitial thickening. Prominence of the central pulmonary vasculature. No focal consolidation. No pleural effusion or pneumothorax. Stable cardiomegaly. Dual lumen left-sided central venous catheter with the tip projecting over the right atrium. No acute osseous abnormality. IMPRESSION: 1. Cardiomegaly with mild pulmonary vascular congestion. Electronically Signed   By: Elige Ko M.D.   On: 10/27/2022 12:26    Medications:  sodium chloride 10 mL/hr at 10/18/22 2141    ceFAZolin (ANCEF) IV 2 g (  10/25/22 1721)    (feeding supplement) PROSource Plus  30 mL Oral TID WC   arformoterol  15 mcg Nebulization BID   benzonatate  200 mg Oral TID   Chlorhexidine Gluconate Cloth  6 each Topical Q0600   Darbepoetin Alfa  150 mcg Subcutaneous Q Thu-1800   feeding supplement (NEPRO CARB STEADY)  237 mL Oral TID BM   heparin  5,000 Units Subcutaneous Q8H   metoprolol tartrate  25 mg Oral Q8H   midodrine  15 mg Oral TID WC   multivitamin  1 tablet Oral QHS   mouth rinse  15 mL Mouth Rinse 4 times per day   pantoprazole  40 mg Oral Daily   revefenacin  175 mcg Nebulization Daily    Dialysis Orders: TTS at [facility] 3:45hr, 450/A1.5, EDW 85.7kg,  2K/2Ca bath, TDC, heparin 3000 unit bolus - no ESA - Calcitriol 1.63mcg PO q HD  Assessment/Plan: MSSA bacteremia/endocarditis/L5-S1 discitis: Blood Cx 6/16 MSSA, negative Cx on 6/17, 6/19. S/p TDC line holiday. TTE was concerning for endocarditis. TEE cancelled d/t high risk factors. LS MRI with possible L5-S1 discitis. On IV Cefazolin 2g q HD x 6 weeks (until 11/04/22). Hyponatremia: Likely 2nd volume overload. Received extra HD yesterday (see below). Na level improving, now 125. Monitor trend. BP/Volume overload: Hypotension preventing adequate UF. Required ICU transfer for CRRT 7/21 - 10/15/22, able to get down to ~ 74kg range. Weight back up to 79kg on 7/26 - dialyzed but unable to get any fluid off due to hypotension and intermittent tachycardia. No effect with IV albumin and midodrine 10mg  tid  Metoprolol dose reduced from 50mg  TID -> 25mg  TID. Fluid restrictions disc. Repeat CXR showed mild vascular congestion and current weight up by 2kg also hyponatremic. Pt received an extra HD yesterday and 2L was removed. Patient looks better and breathing improved. Scheduled for routine HD today with UFG 2.5L. Continue midodrine at 15mg  for now. Will repeat another CXR tomorrow AM to ensure we are heading in the right direction. Deconditioning /weakness: was in CIR, was transferred back to 6E due to  medical complications now awaiting transfer back CIR  ESRD: . Usual TTS schedule, see above Access: TDC pulled 6/18 for line holiday, then new [facility] TDC placed 7/01 per IR. For perm access: s/p L AVF ligation in 2019. Last seen by Dr. Edilia Bo 01/2022 - limited mobility in R arm so planning for L arm graft but had not scheduled surgery yet. Can revisit as an outpatient once bacteremia resolved.  Anemia (ESRD + ABLA, + FOBT): Hgb 9.3. Continue Aranesp q Thursday.   CKD-MBD: CorrCa ^^, hold VDRA. Last phos now up, will repeat level in AM and will start binders if needed Nutrition - Renal diet, Alb low - continue  supplement  Salome Holmes, NP [facility] 10/28/2022,11:00 AM  LOS: 19 days    Pt seen, examined and agree w A/P as above. We are having repeated slow digressions into volume overload w/ associated hyponatremia. Difficult situation overall as BP's won't always allow [place] to pull fluid w/ HD.   Vinson Moselle MD  [facility] 10/28/2022, 8:33 PM

## 2022-10-28 NOTE — Progress Notes (Signed)
Inpatient Rehab Admissions Coordinator:   Discussed with Dr. Janee Morn timing of potential transition to CIR.  Plan to recheck cxr tomorrow AM after HD session today.  Will f/u tomorrow.   Estill Dooms, PT, DPT Admissions Coordinator [phone] 10/28/22  11:56 AM

## 2022-10-29 ENCOUNTER — Inpatient Hospital Stay (HOSPITAL_COMMUNITY): Payer: No Typology Code available for payment source

## 2022-10-29 DIAGNOSIS — Z992 Dependence on renal dialysis: Secondary | ICD-10-CM | POA: Diagnosis not present

## 2022-10-29 DIAGNOSIS — E785 Hyperlipidemia, unspecified: Secondary | ICD-10-CM | POA: Diagnosis not present

## 2022-10-29 DIAGNOSIS — I39 Endocarditis and heart valve disorders in diseases classified elsewhere: Secondary | ICD-10-CM | POA: Diagnosis not present

## 2022-10-29 DIAGNOSIS — R918 Other nonspecific abnormal finding of lung field: Secondary | ICD-10-CM | POA: Diagnosis not present

## 2022-10-29 DIAGNOSIS — N186 End stage renal disease: Secondary | ICD-10-CM | POA: Diagnosis not present

## 2022-10-29 DIAGNOSIS — Z452 Encounter for adjustment and management of vascular access device: Secondary | ICD-10-CM | POA: Diagnosis not present

## 2022-10-29 DIAGNOSIS — I10 Essential (primary) hypertension: Secondary | ICD-10-CM | POA: Diagnosis not present

## 2022-10-29 DIAGNOSIS — E440 Moderate protein-calorie malnutrition: Secondary | ICD-10-CM | POA: Diagnosis not present

## 2022-10-29 DIAGNOSIS — I4719 Other supraventricular tachycardia: Secondary | ICD-10-CM | POA: Diagnosis not present

## 2022-10-29 DIAGNOSIS — J189 Pneumonia, unspecified organism: Secondary | ICD-10-CM | POA: Diagnosis not present

## 2022-10-29 MED ORDER — MIDODRINE HCL 5 MG PO TABS
20.0000 mg | ORAL_TABLET | Freq: Three times a day (TID) | ORAL | Status: DC
  Administered 2022-10-29 – 2022-11-07 (×28): 20 mg via ORAL
  Filled 2022-10-29 (×28): qty 4

## 2022-10-29 NOTE — Plan of Care (Signed)
  Problem: Education: Goal: Knowledge of General Education information will improve Description: Including pain rating scale, medication(s)/side effects and non-pharmacologic comfort measures Outcome: Progressing   Problem: Health Behavior/Discharge Planning: Goal: Ability to manage health-related needs will improve Outcome: Progressing   Problem: Clinical Measurements: Goal: Ability to maintain clinical measurements within normal limits will improve Outcome: Progressing Goal: Will remain free from infection Outcome: Progressing Goal: Cardiovascular complication will be avoided Outcome: Progressing   Problem: Activity: Goal: Risk for activity intolerance will decrease Outcome: Progressing   Problem: Nutrition: Goal: Adequate nutrition will be maintained Outcome: Progressing   Problem: Coping: Goal: Level of anxiety will decrease Outcome: Progressing   Problem: Elimination: Goal: Will not experience complications related to bowel motility Outcome: Progressing   Problem: Safety: Goal: Ability to remain free from injury will improve Outcome: Progressing   Problem: Skin Integrity: Goal: Risk for impaired skin integrity will decrease Outcome: Progressing   Problem: Education: Goal: Knowledge of General Education information will improve Description: Including pain rating scale, medication(s)/side effects and non-pharmacologic comfort measures Outcome: Progressing   Problem: Health Behavior/Discharge Planning: Goal: Ability to manage health-related needs will improve Outcome: Progressing

## 2022-10-29 NOTE — Plan of Care (Signed)
  Problem: Education: Goal: Knowledge of General Education information will improve Description: Including pain rating scale, medication(s)/side effects and non-pharmacologic comfort measures Outcome: Progressing   Problem: Health Behavior/Discharge Planning: Goal: Ability to manage health-related needs will improve Outcome: Progressing   Problem: Clinical Measurements: Goal: Ability to maintain clinical measurements within normal limits will improve Outcome: Progressing Goal: Will remain free from infection Outcome: Progressing Goal: Diagnostic test results will improve Outcome: Progressing Goal: Respiratory complications will improve Outcome: Progressing Goal: Cardiovascular complication will be avoided Outcome: Progressing   Problem: Activity: Goal: Risk for activity intolerance will decrease Outcome: Progressing   Problem: Nutrition: Goal: Adequate nutrition will be maintained Outcome: Progressing   Problem: Coping: Goal: Level of anxiety will decrease Outcome: Progressing   Problem: Elimination: Goal: Will not experience complications related to bowel motility Outcome: Progressing   Problem: Pain Managment: Goal: General experience of comfort will improve Outcome: Not Applicable   Problem: Pain Managment: Goal: General experience of comfort will improve Outcome: Not Applicable

## 2022-10-29 NOTE — Progress Notes (Addendum)
[facility] Progress Note   Subjective:    Tolerated yesterday's HD with net UF 1.7L. Blood pressures are still running soft and HR in low 100s. Appears he remains in sinus tach with HR in low 100s. Seen and examined patient at bedside. He denies SOB, CP, palpitations, and N/V. Next HD 8/8.  Objective Vitals:   10/29/22 0526 10/29/22 0814 10/29/22 0921 10/29/22 1223  BP: (!) 88/58 (!) 87/66 (!) 79/56 106/74  Pulse:  (!) 108 (!) 107 (!) 107  Resp: 19 18  20   Temp: (!) 97.5 F (36.4 C) 97.6 F (36.4 C)  98.3 F (36.8 C)  TempSrc: Axillary Oral  Oral  SpO2: 95% 96%    Weight:      Height:       Physical Exam General: Awake, alert, on RA, NAD Heart: S1 and S2; No MRGs Lungs: Breathing unlabored; diminished posteriorly Abdomen:Soft and non-tender Extremities:No LE edema Dialysis Access: [facility]   Filed Weights   10/28/22 1441 10/28/22 1843 10/29/22 0525  Weight: 72.7 kg 71.1 kg 71.4 kg    Intake/Output Summary (Last 24 hours) at 10/29/2022 1340 Last data filed at 10/28/2022 2300 Gross per 24 hour  Intake --  Output 4153.47 ml  Net -4153.47 ml    Additional Objective Labs: Basic Metabolic Panel: Recent Labs  Lab 10/27/22 1158 10/28/22 0256 10/29/22 0148  NA 121* 125* 128*  K 5.0 3.8 3.7  CL 82* 87* 88*  CO2 20* 22 22  GLUCOSE 121* 109* 106*  BUN 74* 38* 27*  CREATININE 6.25* 4.03* 3.26*  CALCIUM 9.4 9.1 9.2  PHOS 6.2*  --  3.9   Liver Function Tests: Recent Labs  Lab 10/27/22 1158 10/29/22 0148  ALBUMIN 2.8* 3.2*   No results for input(s): "LIPASE", "AMYLASE" in the last 168 hours. CBC: Recent Labs  Lab 10/24/22 0026 10/25/22 0414 10/27/22 0409 10/27/22 1158 10/29/22 0148  WBC 12.1* 11.0* 11.7* 11.3* 9.9  NEUTROABS  --   --   --   --  7.1  HGB 10.4* 9.5* 9.5* 9.3* 9.4*  HCT 34.3* 31.0* 30.3* 30.3* 31.2*  MCV 84.9 80.7 79.5* 80.4 83.9  PLT 238 244 283 285 319   Blood Culture    Component Value Date/Time   SDES BLOOD BLOOD RIGHT  ARM 10/05/2022 2032   SPECREQUEST  10/05/2022 2032    BOTTLES DRAWN AEROBIC AND ANAEROBIC Blood Culture adequate volume   CULT  10/05/2022 2032    NO GROWTH 5 DAYS Performed at [facility], [address] [place], [place] [address]    REPTSTATUS 10/10/2022 FINAL 10/05/2022 2032    Cardiac Enzymes: No results for input(s): "CKTOTAL", "CKMB", "CKMBINDEX", "TROPONINI" in the last 168 hours. CBG: No results for input(s): "GLUCAP" in the last 168 hours. Iron Studies: No results for input(s): "IRON", "TIBC", "TRANSFERRIN", "FERRITIN" in the last 72 hours. Lab Results  Component Value Date   INR 1.4 (H) 10/10/2022   INR 1.4 (H) 09/10/2022   INR 1.13 09/23/2017   Studies/Results: DG CHEST PORT 1 VIEW  Result Date: 10/29/2022 CLINICAL DATA:  End-stage renal disease. EXAM: PORTABLE CHEST 1 VIEW COMPARISON:  Chest x-ray dated October 27, 2022 FINDINGS: Unchanged position of left central venous line. Unchanged cardiac contours. Persistent right perihilar and left basilar opacities. No large pleural effusion. No evidence of pneumothorax. IMPRESSION: 1. Persistent right perihilar opacity which may represent loculated fluid. 2. Left basilar opacity, likely due to atelectasis. Electronically Signed   By: Aliene Altes.D.  On: 10/29/2022 08:22    Medications:  sodium chloride 10 mL/hr at 10/18/22 2141    ceFAZolin (ANCEF) IV 2 g (10/28/22 1900)    (feeding supplement) PROSource Plus  30 mL Oral TID WC   arformoterol  15 mcg Nebulization BID   benzonatate  200 mg Oral TID   Chlorhexidine Gluconate Cloth  6 each Topical Q0600   Darbepoetin Alfa  150 mcg Subcutaneous Q Thu-1800   feeding supplement (NEPRO CARB STEADY)  237 mL Oral TID BM   heparin  5,000 Units Subcutaneous Q8H   metoprolol tartrate  25 mg Oral Q8H   midodrine  15 mg Oral TID WC   multivitamin  1 tablet Oral QHS   pantoprazole  40 mg Oral Daily   revefenacin  175 mcg Nebulization Daily    Dialysis Orders: TTS at  NW 3:45hr, 450/A1.5, EDW 85.7kg, 2K/2Ca bath, TDC, heparin 3000 unit bolus - no ESA - Calcitriol 1.43mcg PO q HD  Assessment/Plan: MSSA bacteremia/endocarditis/L5-S1 discitis: Blood Cx 6/16 MSSA, negative Cx on 6/17, 6/19. S/p TDC line holiday. TTE was concerning for endocarditis. TEE cancelled d/t high risk factors. LS MRI with possible L5-S1 discitis. On IV Cefazolin 2g q HD x 6 weeks (until 11/04/22). Hyponatremia: Likely 2nd volume overload. Received HD 8/5 and 8/6 (see below). Na level improving, now 128. Monitor trend. BP/Volume overload: Hypotension preventing adequate UF. Required ICU transfer for CRRT 7/21 - 10/15/22, able to get down to ~ 74kg range. Weight back up to 79kg on 7/26 - dialyzed but unable to get any fluid off due to hypotension and intermittent tachycardia. No effect with IV albumin. Fluid restrictions disc. Repeat CXR from 8/7 showed mild persistent R perihilar opacity which may represent loculated fluid; L basilar opacity. Pt received HD 8/5/ and 8/6. Patient remains not in respiratory distress but we are still having difficulty removing fluid 2nd low Bps. He remains in sinus tach with HR in 100s. I spoke with Dr. Maurice March to stop Metoprolol and I will raise Midodrine to 20mg  TID-okay to give an extra 20mg  mid-run during HD if needed. Keep EDWs low, last weight is 71.4kg. Deconditioning /weakness: was in CIR, was transferred back to 6E due to  medical complications now awaiting transfer back CIR  ESRD: . Usual TTS schedule, see above, next HD 10/30/22. Access: TDC pulled 6/18 for line holiday, then new LIJ TDC placed 7/01 per IR. For perm access: s/p L AVF ligation in 2019. Last seen by Dr. Edilia Bo 01/2022 - limited mobility in R arm so planning for L arm graft but had not scheduled surgery yet. Can revisit as an outpatient once bacteremia resolved.  Anemia (ESRD + ABLA, + FOBT): Hgb 9.4. Continue Aranesp q Thursday.   CKD-MBD: CorrCa ^^, hold VDRA. Phos fluctuating, back to  goal, hold off on binders for now. Nutrition - Renal diet, Alb low - continue supplement Dispo - remains inpatient. Patient is not appropriate for CIR at this time as we are closely monitoring his volume status and BP on HD.   Salome Holmes, NP [facility] 10/29/2022,1:40 PM  LOS: 20 days

## 2022-10-29 NOTE — Progress Notes (Signed)
Progress Note   Patient: Ryan [name] WUJ:[id number] DOB: March 29, 1960 DOA: 10/09/2022     20 DOS: the patient was seen and examined on 10/29/2022   Brief hospital course: Mr. Pier was admitted to the hospital with the working diagnosis of acute hypoxemic respiratory failure due to pneumonia in the setting of endocarditis with septic emboli.   62 yo male with the past medical history of hypertension, ESRD, and hyperlipidemia who was transferred from CIR to inpatient setting due to suspected new onset atrial fibrillation with RVR. Recent hospitalization 06/16 to 07/04 for mitral valve endocarditis, MSSA bacteremia with brain septic emboli.  One week after his discharge while in CIR he was diagnosed with pneumonia, and worsening anemia. On 07/18 he developed suspected atrial fibrillation prompting his transfer back to inpatient setting.  On his initial physical examination his blood pressure was 103/72, HR 101, RR 20 and temp 97.7, lungs with bibasilar rhonchi, heart with S1 and S2 present and irregular, abdomen with no distention and no lower extremity edema.   Na 124, K 3,9 Cl 88, bicarbonate 24, glucose 115 bun 40 cr 7,42 Wbc 14.3 hgb 7.7 plt 377  TSH 8,9   Chest radiograph with cardiomegaly, bilateral interstitial infiltrates symmetric and central, bilateral hilar vascular congestion. HD catheter in the left internal jugular vein with tip in the SVC.   EKG 135 bpm, normal axis, normal intervals, sinus rhythm with 1st degree AV block and PAC, no significant ST segment or T wave changes.   7/18 - Readmitted to [facility] for Afib RVR/SOB requiring BiPAP, inability to complete HD. 7/22 - Off of BiPAP, tolerating Salter. Remains anxious. Tolerating CRRT. 7/24 patient on sinus rhythm and off CRRT.  07/25 transferred to [facility].   08/04 patient with improvement in volume status, tolerating renal replacement therapy with ultrafiltration.  Has been placed on midodrine for blood pressure support.   Pending transfer back to CIR to continue physical therapy.      Assessment and Plan: * Multifocal atrial tachycardia Patient required diltiazem infusion, now he has been transitioned to oral metoprolol with good toleration.  Telemetry personally reviewed with features of MAT, today in sinus rhythm.   Ruled out atrial fibrillation, (note from cardiology consultation from 07.18 concluded no atrial fibrillation).   08/04 EKG 106 bpm, normal axis, normal intervals, sinus rhythm with PAC, no significant ST segment or T wave changes.   CAP (community acquired pneumonia) Acute hypoxemic respiratory failure.   Oxygenation has improved. Plan to continue antibiotic therapy until 08/13 with cefazolin.  Continue bronchodilator therapy.  ESRD on dialysis (HCC) Hyponatremia.   Today serum Na is 119, BUM is 66 and anion gap 17.  His wight is up form 08/03, 2 kg.  Plan for  HD tomorrow per nephrology, now he is on midodrine for blood pressure support. May need HD today for volume management will check with nephrology.    Anemia of chronic renal disease.  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 Plan to continue with IV cefazolin.   L4 and S1 discitis and osteomyelitis, will need antibiotic therapy with IV cephalosporin until 08/13.    Hyperlipidemia Continue statin therapy.   Hypertension Hypotension.  Systolic blood pressure 96 to 956 mmHg.  Plan to continue midodrine 15 mg tid.   Echocardiogram with preserved LV systolic function EF 55 to 60%, moderate LVH, RV systolic function preserved, LA with mild dilatation,  large rounded structure on the anterior leflet of the mitral valve, partially obstructing mitral inflow with ball-valve type motion. No mitral stenosis or mitral regurgitation. Mild aortic valve stenosis.    Moderate protein malnutrition (HCC) Continue  nutritional supplementation,.         Subjective: Patient is feeling fatigue, no chest pain or dyspnea, no nausea or vomiting.   Physical Exam: Vitals:   10/29/22 0814 10/29/22 0817 10/29/22 0921 10/29/22 1223  BP: (!) 87/66  (!) 79/56 106/74  Pulse: (!) 108  (!) 107 (!) 107  Resp: 18   20  Temp: 97.6 F (36.4 C)   98.3 F (36.8 C)  TempSrc: Oral   Oral  SpO2: 96% 96%    Weight:      Height:       Neurology awake and alert ENT with mild pallor Cardiovascular with S1 and S2 present and regular with no gallop, positive murmur at the apex Respiratory with no rales or wheezing, no rhonchi Abdomen with no distention  No lower extremity edema  Data Reviewed:    Family Communication: no family at the bedside   Disposition: Status is: Inpatient Remains inpatient appropriate because: renal replacement therapy   Planned Discharge Destination: Rehab     Author: Coralie Keens, MD 10/29/2022 2:29 PM  For on call review [email or web address].

## 2022-10-29 NOTE — Progress Notes (Signed)
Inpatient Rehab Admissions Coordinator:   Nephrology wants to continue monitoring status in the acute setting.  We will follow for readiness to transition to CIR.   Estill Dooms, PT, DPT Admissions Coordinator [phone] 10/29/22  1:30 PM

## 2022-10-29 NOTE — Progress Notes (Signed)
Physical Therapy Treatment Patient Details Name: Ryan [name] MRN: [id number] DOB: 02-Feb-1961 Today's Date: 10/29/2022   History of Present Illness 62 yo male presents to [facility] from CIR on 7/18 for afib RVR, aspiration PNA. RR on 7/21 for acute respiratory distress due to pulmonary edema requiring bipap and CRRT, and transfer to ICU. 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 S/p  HD cath L subclavian on 7/1. PMH includes HTN, HLD, ESRD on HD TTS.    PT Comments  Pt showing good improvement toward goals.  Needs a little push to keep moving through the session.  Emphasis on standing exercise and progression of gait with the RW in the hall while monitoring HR/RR.    If plan is discharge home, recommend the following: Help with stairs or ramp for entrance;Assist for transportation;Assistance with cooking/housework;A little help with walking and/or transfers;A little help with bathing/dressing/bathroom   Can travel by private Psychologist, clinical (4 wheels)    Recommendations for Other Services       Precautions / Restrictions Precautions Precautions: Fall Precaution Comments: watch for hypotension Required Braces or Orthoses: Spinal Brace Spinal Brace: Lumbar corset;Other (comment) Spinal Brace Comments: for comfort Restrictions Weight Bearing Restrictions: No     Mobility  Bed Mobility Overal bed mobility: Needs Assistance         Sit to supine: Supervision   General bed mobility comments: supervision fo lines, cues for log roll to minimize back pain    Transfers Overall transfer level: Needs assistance Equipment used: 1 person hand held assist Transfers: Sit to/from Stand Sit to Stand: Contact guard assist   Step pivot transfers: Contact guard assist       General transfer comment: bed to chair     Ambulation/Gait Ambulation/Gait assistance: Contact guard assist Gait Distance (Feet): 100 Feet (x2 with standing rest at half way point) Assistive device: Rolling walker (2 wheels) Gait Pattern/deviations: Step-through pattern Gait velocity: decr Gait velocity interpretation: 1.31 - 2.62 ft/sec, indicative of limited community ambulator   General Gait Details: generally steady, but slow, able to increase speed, but not significantly.  cues for posture.  HR 110's to max witnessed 122 bpm.  Sats mid 90's and RR 20's to max 43 rpms   Stairs             Wheelchair Mobility     Tilt Bed    Modified Rankin (Stroke Patients Only)       Balance Overall balance assessment: Needs assistance   Sitting balance-Leahy Scale: Good       Standing balance-Leahy Scale: Fair Standing balance comment: prefers AD or UE support, but not necessary for short periods in standing.                            Cognition Arousal: Alert Behavior During Therapy: WFL for tasks assessed/performed Overall Cognitive Status: Within Functional Limits for tasks assessed                                          Exercises Other Exercises Other Exercises: sit to stand from recliner with armrests x 10 reps. Other Exercises: hip flexion x10 reps bil standing in the RW.    General Comments  Pertinent Vitals/Pain Pain Assessment Pain Assessment: Faces Faces Pain Scale: Hurts little more Pain Location: back Pain Descriptors / Indicators: Discomfort Pain Intervention(s): Monitored during session    Home Living                          Prior Function            PT Goals (current goals can now be found in the care plan section) Acute Rehab PT Goals PT Goal Formulation: With patient Time For Goal Achievement: 11/03/22 Potential to Achieve Goals: Good Progress towards PT goals: Progressing toward goals    Frequency    Min 1X/week       PT Plan Current plan remains appropriate    Co-evaluation              AM-PAC PT "6 Clicks" Mobility   Outcome Measure  Help needed turning from your back to your side while in a flat bed without using bedrails?: A Little Help needed moving from lying on your back to sitting on the side of a flat bed without using bedrails?: A Little Help needed moving to and from a bed to a chair (including a wheelchair)?: A Little Help needed standing up from a chair using your arms (e.g., wheelchair or bedside chair)?: A Little Help needed to walk in hospital room?: A Little Help needed climbing 3-5 steps with a railing? : A Little 6 Click Score: 18    End of Session   Activity Tolerance: Patient tolerated treatment well Patient left: in bed;with call bell/phone within reach Nurse Communication: Mobility status PT Visit Diagnosis: Other abnormalities of gait and mobility (R26.89);Muscle weakness (generalized) (M62.81) Pain - part of body:  (back)     Time: 1610-9604 PT Time Calculation (min) (ACUTE ONLY): 42 min  Charges:    $Gait Training: 8-22 mins $Therapeutic Exercise: 8-22 mins $Therapeutic Activity: 8-22 mins PT General Charges $$ ACUTE PT VISIT: 1 Visit                     10/29/2022  Jacinto Halim., PT Acute Rehabilitation Services [phone]  (office)   Eliseo Gum  10/29/2022, 12:37 PM

## 2022-10-29 NOTE — Progress Notes (Signed)
Initial Nutrition Assessment  DOCUMENTATION CODES:   Non-severe (moderate) malnutrition in context of chronic illness (worsening malnutrition with this acute illness)  INTERVENTION:   Continue Nepro Shake po TID, each supplement provides 425 kcal and 19 grams protein  Continue Prosource Plus PO TID, each supplement provides 100 kcal and 15 g protein   Continue Renal MVI   Secure messaged MD about GI or ENT consult    NUTRITION DIAGNOSIS:   Moderate Malnutrition related to chronic illness (worsening with acute illness) as evidenced by moderate muscle depletion, moderate fat depletion, energy intake < 75% for > or equal to 1 month.  GOAL:   Patient will meet greater than or equal to 90% of their needs  MONITOR:   PO intake, Supplement acceptance, Weight trends, Labs  REASON FOR ASSESSMENT:   Consult Assessment of nutrition requirement/status (CRRT)  ASSESSMENT:   62 yo male admitted from CIR with afib with RVR and acute respiratory failure with pulmonary edema. Pt with recent admission to [facility] from 6/16 to 7/04 for L5-S1 osteomyelitis and MSSA bacteremia in addition to infective endocarditis of the MV and septic emboli to lungs and brain.  PMH includes ESRD on iHD (since 2018), HTN, HLD  6/16 Admitted to [facility] for L5-S1 osteomyelitis, MSSA bacteremia 7/04 D/C from hospital with admission to CIR 7/18 Readmitted to [facility] from CIR with Afib RVR while receiving iHD and SOB requiring BiPap 7/21 Rapid response, acute respiratory distress, transferred to ICU, CRRT initiated  Visited patient at bedside who reports no appetite but forcing himself to eat at every meal and consume ONS despite ongoing throat irritation that causes him to cough a lot and consume an abundance of fluids.   He reports liking the Nepro, but the milk leaves some sort of film on his esophageal lining, he reports, so he has been drinking boost breeze. RD encouraged him to continue with boost  breeze if he is unable to tolerate Nepro for the time being.  Patient coughed all throughout follow up and RD provided him half a cup of hot tea.   RD secure chatted MD about the concerns above and he will consult SLP to assess patient.   Plans for CIR in the next day or 2.   Patient's likes and dislikes added to meal profile. Scrambled egg texture caused him to vomit the other day.   Labs: Na 128, Glu 106, BUN 27, Cr 3.26 Meds:  reviewed   I/O's: -7.8 L since admission  Wt: admit 178#, current 157#   Diet Order:   Diet Order             DIET DYS 3 Room service appropriate? Yes; Fluid consistency: Thin; Fluid restriction: 1200 mL Fluid  Diet effective now                   EDUCATION NEEDS:   Education needs have been addressed  Skin:  Skin Assessment: Skin Integrity Issues: Skin Integrity Issues:: Other (Comment) Other: skin tears  Last BM:  8/6 type 3  Height:   Ht Readings from Last 1 Encounters:  10/09/22 5\' 9"  (1.753 m)    Weight:   Wt Readings from Last 1 Encounters:  10/29/22 71.4 kg     BMI:  Body mass index is 23.24 kg/m.  Estimated Nutritional Needs:   Kcal:  2200-2400 kcals  Protein:  120-140 g  Fluid:  1L plus UOP  Leodis Rains, RDN, LDN  Clinical Nutrition

## 2022-10-29 NOTE — Progress Notes (Signed)
Occupational Therapy Treatment Patient Details Name: Ryan [name] MRN: [id number] DOB: November 30, 1960 Today's Date: 10/29/2022   History of present illness 61 yo male presents to [facility] from CIR on 7/18 for afib RVR, aspiration PNA. RR on 7/21 for acute respiratory distress due to pulmonary edema requiring bipap and CRRT, and transfer to ICU. 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 S/p  HD cath L subclavian on 7/1. PMH includes HTN, HLD, ESRD on HD TTS.   OT comments  Pt assisted OOB to chair with min assist. Completed seated grooming and set up for breakfast. Pt able to don socks at bed level using figure 4 method. Chose to wear back brace this session. Appearing uncomfortable in chair, but agreed to remain for breakfast. Continue to recommend post acute intensive therapy.      If plan is discharge home, recommend the following:  Assistance with cooking/housework;Help with stairs or ramp for entrance;Assist for transportation;Direct supervision/assist for medications management;Direct supervision/assist for financial management;A little help with walking and/or transfers;A little help with bathing/dressing/bathroom   Equipment Recommendations  Tub/shower bench;BSC/3in1    Recommendations for Other Services      Precautions / Restrictions Precautions Precautions: Fall Precaution Comments: watch for hypotension Required Braces or Orthoses: Spinal Brace Spinal Brace: Lumbar corset;Other (comment) Spinal Brace Comments: for comfort Restrictions Weight Bearing Restrictions: No       Mobility Bed Mobility Overal bed mobility: Needs Assistance Bed Mobility: Rolling, Sidelying to Sit Rolling: Supervision Sidelying to sit: Supervision       General bed mobility comments: supervision fo lines, cues for log roll to minimize back pain    Transfers Overall  transfer level: Needs assistance Equipment used: 1 person hand held assist Transfers: Sit to/from Stand Sit to Stand: Contact guard assist     Step pivot transfers: Min assist     General transfer comment: bed to chair     Balance Overall balance assessment: Needs assistance   Sitting balance-Leahy Scale: Good       Standing balance-Leahy Scale: Poor                             ADL either performed or assessed with clinical judgement   ADL Overall ADL's : Needs assistance/impaired     Grooming: Set up;Sitting;Wash/dry hands;Wash/dry face               Lower Body Dressing: Set up;Bed level                      Extremity/Trunk Assessment              Vision       Perception     Praxis      Cognition Arousal: Alert Behavior During Therapy: WFL for tasks assessed/performed Overall Cognitive Status: Within Functional Limits for tasks assessed                                          Exercises      Shoulder Instructions       General Comments      Pertinent Vitals/ Pain       Pain Assessment Pain Assessment: Faces Faces Pain Scale: Hurts little more Pain Location: back Pain Descriptors / Indicators: Discomfort  Pain Intervention(s): Repositioned  Home Living                                          Prior Functioning/Environment              Frequency  Min 1X/week        Progress Toward Goals  OT Goals(current goals can now be found in the care plan section)  Progress towards OT goals: Progressing toward goals  Acute Rehab OT Goals OT Goal Formulation: With patient Time For Goal Achievement: 11/05/22 Potential to Achieve Goals: Good  Plan Discharge plan remains appropriate    Co-evaluation                 AM-PAC OT "6 Clicks" Daily Activity     Outcome Measure   Help from another person eating meals?: None Help from another person taking care of personal  grooming?: A Little Help from another person toileting, which includes using toliet, bedpan, or urinal?: A Little Help from another person bathing (including washing, rinsing, drying)?: A Little Help from another person to put on and taking off regular upper body clothing?: A Little Help from another person to put on and taking off regular lower body clothing?: A Little 6 Click Score: 19    End of Session    OT Visit Diagnosis: Unsteadiness on feet (R26.81);Muscle weakness (generalized) (M62.81);Pain   Activity Tolerance Patient tolerated treatment well   Patient Left in chair;with call bell/phone within reach   Nurse Communication          Time: 1610-9604 OT Time Calculation (min): 35 min  Charges: OT General Charges $OT Visit: 1 Visit OT Treatments $Self Care/Home Management : 23-37 mins  Berna Spare, OTR/L Acute Rehabilitation Services Office: [phone]   Evern Bio 10/29/2022, 10:23 AM

## 2022-10-29 NOTE — Care Management Important Message (Signed)
Important Message  Patient Details  Name: Ryan [name] MRN: [id number] Date of Birth: [birth year]   Medicare Important Message Given:  Yes     [name] 10/29/2022, 10:43 AM

## 2022-10-30 DIAGNOSIS — J189 Pneumonia, unspecified organism: Secondary | ICD-10-CM | POA: Diagnosis not present

## 2022-10-30 DIAGNOSIS — I4719 Other supraventricular tachycardia: Secondary | ICD-10-CM | POA: Diagnosis not present

## 2022-10-30 DIAGNOSIS — E785 Hyperlipidemia, unspecified: Secondary | ICD-10-CM | POA: Diagnosis not present

## 2022-10-30 DIAGNOSIS — E440 Moderate protein-calorie malnutrition: Secondary | ICD-10-CM | POA: Diagnosis not present

## 2022-10-30 DIAGNOSIS — I39 Endocarditis and heart valve disorders in diseases classified elsewhere: Secondary | ICD-10-CM | POA: Diagnosis not present

## 2022-10-30 DIAGNOSIS — I10 Essential (primary) hypertension: Secondary | ICD-10-CM | POA: Diagnosis not present

## 2022-10-30 MED ORDER — HEPARIN SODIUM (PORCINE) 1000 UNIT/ML IJ SOLN
INTRAMUSCULAR | Status: AC
  Filled 2022-10-30: qty 7

## 2022-10-30 NOTE — Progress Notes (Signed)
   10/30/22 1150  Vitals  Temp 98 F (36.7 C)  Pulse Rate (!) 101  Resp 15  BP 96/65  SpO2 98 %  O2 Device Room Air  Weight 71 kg  Type of Weight Post-Dialysis  Oxygen Therapy  Patient Activity (if Appropriate) In chair  Pulse Oximetry Type Continuous  During Treatment Monitoring  Blood Flow Rate (mL/min) 399 mL/min  Arterial Pressure (mmHg) -289.68 mmHg  Venous Pressure (mmHg) 211.1 mmHg  TMP (mmHg) 13.94 mmHg  Ultrafiltration Rate (mL/min) 1009 mL/min  Dialysate Flow Rate (mL/min) 299 ml/min  HD Safety Checks Performed Yes  Intra-Hemodialysis Comments Tx completed;Tolerated well  Dialysis Fluid Bolus Normal Saline  Bolus Amount (mL) 300 mL  Dialysate Potassium Concentration 3  Dialysate Calcium Concentration 2.5   Received patient in bed to unit.  Alert and oriented.  Informed consent signed and in chart.   TX duration:3.25hrs  Patient tolerated well.  Transported back to the room  Alert, without acute distress.  Hand-off given to patient's nurse.   Access used: LHD Access issues: none  Total UF removed: 2.5L Medication(s) given: none    Na'Shaminy T  [facility]

## 2022-10-30 NOTE — Progress Notes (Signed)
[facility] Progress Note   Subjective:    Seen and examined patient on HD in the recliner. Tolerating UFG 2.5L. BP is 98/69. No acute issues.   Objective Vitals:   10/30/22 0807 10/30/22 0810 10/30/22 0830 10/30/22 0900  BP: 98/79 98/79 98/69  97/70  Pulse: (!) 107 (!) 103 (!) 101 (!) 102  Resp: 20 (!) 27 (!) 24 18  Temp: 97.7 F (36.5 C)     TempSrc:      SpO2: 94% 94% 95% 91%  Weight: 73.5 kg     Height:       Physical Exam General: Awake, alert, on RA, NAD Heart: S1 and S2; No MRGs Lungs: Breathing unlabored; diminished posteriorly Abdomen:Soft and non-tender Extremities:No LE edema Dialysis Access: [facility]   Filed Weights   10/29/22 0525 10/30/22 0515 10/30/22 0807  Weight: 71.4 kg 72.6 kg 73.5 kg    Intake/Output Summary (Last 24 hours) at 10/30/2022 0917 Last data filed at 10/30/2022 0810 Gross per 24 hour  Intake 588 ml  Output -300 ml  Net 888 ml    Additional Objective Labs: Basic Metabolic Panel: Recent Labs  Lab 10/27/22 1158 10/28/22 0256 10/29/22 0148 10/29/22 2316  NA 121* 125* 128* 126*  K 5.0 3.8 3.7 3.6  CL 82* 87* 88* 87*  CO2 20* 22 22 21*  GLUCOSE 121* 109* 106* 120*  BUN 74* 38* 27* 45*  CREATININE 6.25* 4.03* 3.26* 4.81*  CALCIUM 9.4 9.1 9.2 9.0  PHOS 6.2*  --  3.9 5.1*   Liver Function Tests: Recent Labs  Lab 10/27/22 1158 10/29/22 0148 10/29/22 2316  ALBUMIN 2.8* 3.2* 3.0*   No results for input(s): "LIPASE", "AMYLASE" in the last 168 hours. CBC: Recent Labs  Lab 10/24/22 0026 10/25/22 0414 10/27/22 0409 10/27/22 1158 10/29/22 0148  WBC 12.1* 11.0* 11.7* 11.3* 9.9  NEUTROABS  --   --   --   --  7.1  HGB 10.4* 9.5* 9.5* 9.3* 9.4*  HCT 34.3* 31.0* 30.3* 30.3* 31.2*  MCV 84.9 80.7 79.5* 80.4 83.9  PLT 238 244 283 285 319   Blood Culture    Component Value Date/Time   SDES BLOOD BLOOD RIGHT ARM 10/05/2022 2032   SPECREQUEST  10/05/2022 2032    BOTTLES DRAWN AEROBIC AND ANAEROBIC Blood Culture adequate  volume   CULT  10/05/2022 2032    NO GROWTH 5 DAYS Performed at [facility], [address], [place], [place] [address]    REPTSTATUS 10/10/2022 FINAL 10/05/2022 2032    Cardiac Enzymes: No results for input(s): "CKTOTAL", "CKMB", "CKMBINDEX", "TROPONINI" in the last 168 hours. CBG: No results for input(s): "GLUCAP" in the last 168 hours. Iron Studies: No results for input(s): "IRON", "TIBC", "TRANSFERRIN", "FERRITIN" in the last 72 hours. Lab Results  Component Value Date   INR 1.4 (H) 10/10/2022   INR 1.4 (H) 09/10/2022   INR 1.13 09/23/2017   Studies/Results: DG CHEST PORT 1 VIEW  Result Date: 10/29/2022 CLINICAL DATA:  End-stage renal disease. EXAM: PORTABLE CHEST 1 VIEW COMPARISON:  Chest x-ray dated October 27, 2022 FINDINGS: Unchanged position of left central venous line. Unchanged cardiac contours. Persistent right perihilar and left basilar opacities. No large pleural effusion. No evidence of pneumothorax. IMPRESSION: 1. Persistent right perihilar opacity which may represent loculated fluid. 2. Left basilar opacity, likely due to atelectasis. Electronically Signed   By: Allegra Lai M.D.   On: 10/29/2022 08:22    Medications:  sodium chloride 10 mL/hr at 10/18/22 2141  ceFAZolin (ANCEF) IV 2 g (10/28/22 1900)    (feeding supplement) PROSource Plus  30 mL Oral TID WC   arformoterol  15 mcg Nebulization BID   benzonatate  200 mg Oral TID   Chlorhexidine Gluconate Cloth  6 each Topical Q0600   Darbepoetin Alfa  150 mcg Subcutaneous Q Thu-1800   feeding supplement (NEPRO CARB STEADY)  237 mL Oral TID BM   heparin  5,000 Units Subcutaneous Q8H   heparin sodium (porcine)       midodrine  20 mg Oral TID WC   multivitamin  1 tablet Oral QHS   pantoprazole  40 mg Oral Daily   revefenacin  175 mcg Nebulization Daily    Dialysis Orders: TTS at [facility] 3:45hr, 450/A1.5, EDW 85.7kg, 2K/2Ca bath, TDC, heparin 3000 unit bolus - no ESA - Calcitriol 1.6mcg PO q  HD  Assessment/Plan: MSSA bacteremia/endocarditis/L5-S1 discitis: Blood Cx 6/16 MSSA, negative Cx on 6/17, 6/19. S/p TDC line holiday. TTE was concerning for endocarditis. TEE cancelled d/t high risk factors. LS MRI with possible L5-S1 discitis. On IV Cefazolin 2g q HD x 6 weeks (until 11/04/22). Hyponatremia: Likely 2nd volume overload. Received HD 8/5 and 8/6 (see below). Na level improving, now 126. Monitor trend. BP/Volume overload: Hypotension preventing adequate UF. Required ICU transfer for CRRT 7/21 - 10/15/22, able to get down to ~ 74kg range. Weight back up to 79kg on 7/26 - dialyzed but unable to get any fluid off due to hypotension and intermittent tachycardia. No effect with IV albumin. Fluid restrictions disc. Repeat CXR from 8/7 showed mild persistent R perihilar opacity which may represent loculated fluid; L basilar opacity. Pt received HD 8/5/ and 8/6. Patient remains not in respiratory distress but we are still having difficulty removing fluid 2nd low Bps. He remains in sinus tach with HR in 100s. I spoke with Dr. Wyline Mood yesterday-Metoprolol stopped and Midodrine raised to 20mg  TID-okay to give an extra 20mg  mid-run during HD if needed. Keep EDWs low, last weight is 71.4kg. Deconditioning /weakness: was in CIR, was transferred back to 6E due to  medical complications now awaiting transfer back CIR  ESRD: . Usual TTS schedule, see above, on HD. Access: TDC pulled 6/18 for line holiday, then new LIJ TDC placed 7/01 per IR. For perm access: s/p L AVF ligation in 2019. Last seen by Dr. Edilia Bo 01/2022 - limited mobility in R arm so planning for L arm graft but had not scheduled surgery yet. Can revisit as an outpatient once bacteremia resolved.  Anemia (ESRD + ABLA, + FOBT): Hgb 9.4. Continue Aranesp q Thursday.   CKD-MBD: CorrCa ^^, hold VDRA. Phos fluctuating, back to goal, hold off on binders for now. Nutrition - Renal diet, Alb low - continue supplement Dispo - remains inpatient. Patient  is not appropriate for CIR at this time as we are closely monitoring his volume status and BP on HD.  Salome Holmes, NP [facility] 10/30/2022,9:17 AM  LOS: 21 days

## 2022-10-30 NOTE — Evaluation (Signed)
Clinical/Bedside Swallow Evaluation Patient Details  Name: Ryan [name] MRN: [id number] Date of Birth: 1960-11-26  Today's Date: 10/30/2022 Time: SLP Start Time (ACUTE ONLY): 1440 SLP Stop Time (ACUTE ONLY): 1500 SLP Time Calculation (min) (ACUTE ONLY): 20 min  Past Medical History:  Past Medical History:  Diagnosis Date   Allergy    Anemia    Blood transfusion without reported diagnosis    Dialysis patient (HCC)    Tues,thurs,sat   ESRD (end stage renal disease) (HCC)    TTHS-    Family history of adverse reaction to anesthesia    father had allergic reaction with anesthesia with a dental procedure-(pt. doesn't know)   Hyperlipidemia    Hypertension    Past Surgical History:  Past Surgical History:  Procedure Laterality Date   A/V FISTULAGRAM Left 12/15/2016   Procedure: A/V Fistulagram - left;  Surgeon: Chuck Hint, MD;  Location: [facility] INVASIVE CV LAB;  Service: Cardiovascular;  Laterality: Left;   AV FISTULA PLACEMENT Left 08/28/2016   Procedure: LEFT UPPER  ARM ARTERIOVENOUS (AV) FISTULA CREATION;  Surgeon: Chuck Hint, MD;  Location: [facility] OR;  Service: Vascular;  Laterality: Left;   DIALYSIS/PERMA CATHETER INSERTION Right 12/21/2017   Procedure: INSERTION OF DIALYSIS CATHETER Right Internal Jugular .;  Surgeon: Sherren Kerns, MD;  Location: [facility] OR;  Service: Vascular;  Laterality: Right;   FISTULA SUPERFICIALIZATION Left 09/23/2017   Procedure: FISTULA PLICATION LEFT ARM;  Surgeon: Sherren Kerns, MD;  Location: [facility] OR;  Service: Vascular;  Laterality: Left;   FISTULOGRAM Left 12/21/2017   Procedure: FISTULOGRAM with Balloon Angioplasty.;  Surgeon: Sherren Kerns, MD;  Location: [facility] OR;  Service: Vascular;  Laterality: Left;   HEMATOMA EVACUATION Left 08/27/2020   Procedure: EVACUATION HEMATOMA LEFT ARM;  Surgeon: Sherren Kerns, MD;  Location: [facility] OR;  Service: Vascular;  Laterality: Left;   IR FLUORO GUIDE CV LINE LEFT  09/22/2022   IR FLUORO  GUIDE CV LINE RIGHT  09/12/2022   IR FLUORO GUIDE CV LINE RIGHT  09/17/2022   IR REMOVAL TUN CV CATH W/O FL  09/09/2022   IR THORACENTESIS ASP PLEURAL SPACE W/IMG GUIDE  08/22/2017   1.2 L -right-sided   IR THORACENTESIS ASP PLEURAL SPACE W/IMG GUIDE  10/19/2017   IR US GUIDE VASC ACCESS LEFT  09/22/2022   IR US GUIDE VASC ACCESS RIGHT  09/12/2022   KIDNEY TRANSPLANT     REVISON OF ARTERIOVENOUS FISTULA Left 12/21/2017   Procedure: PLICATION and Ligation of F LEFT ARM ARTERIOVENOUS FISTULA;  Surgeon: Sherren Kerns, MD;  Location: [facility] OR;  Service: Vascular;  Laterality: Left;   REVISON OF ARTERIOVENOUS FISTULA Left 07/16/2020   Procedure: EXCISION OF LEFT ARM ARTERIOVENOUS FISTULA;  Surgeon: Sherren Kerns, MD;  Location: [facility] OR;  Service: Vascular;  Laterality: Left;   stent in kidneys     dec 2017   TRANSTHORACIC ECHOCARDIOGRAM  09/22/2017    Severe LVH.  Normal function -EF 55-60%.  GRII DD.  Moderate RV dilation with mildly reduced RV function.   Fixed right coronary cusp with very mild aortic stenosis.  The myocardium has a speckled appearance --> .   Recommend cardiac MRI to evaluate for amyloid   UPPER EXTREMITY VENOGRAPHY Bilateral 06/01/2020   Procedure: UPPER EXTREMITY VENOGRAPHY;  Surgeon: Chuck Hint, MD;  Location: [facility] INVASIVE CV LAB;  Service: Cardiovascular;  Laterality: Bilateral;   HPI:  Ryan [name] is a 62 yo male admitted from CIR 7/18 for  new onset A-fib with RVR while undergoing HD. Initially presented 6/6 with lower back pain and found to have evidence of L5/S1 discitis and osteomyelitis as well as MRSA bacteremia with mitral valve endocarditis. MRI Brain 6/27 with scattered subcentimeter acute ischemic infarcts involving bilateral cerebral hemispheres and R cerebellum. Per H&P note, while in rehab medicine team consulted 7/11 for cough and dyspnea with eventual diagnosis with concerns for PNA/aspiration. MBS 7/18 with mild oral residue with good sensation  to clear and an otherwise safe and efficient oropharyngeal swallow. A regular texture diet with thin liquids was recommended at that time, however, pt requested Dys 3 textures. PMH includes anemia, ESRD, HLD, HTN    Assessment / Plan / Recommendation  Clinical Impression  Pt seen with lunch tray consisting of Dys 3 textures. He reports no difficulty swallowing prior to this admission and recalls previous MBS (7/18). He states that he has noticed increased coughing with solids, even regurgitating during breakfast this morning. He reports a globus sensation, particularly with solids, which he typically follows with liquids to clear. Pt coughing profusely upon SLP arrival, after taking a bite of pot roast. He does not present with any oral s/s of oral dysphagia throughout all trials. More solid foods (meat) resulted in coughing, both immediate and delayed, which pt reports has been ongoing. Trials of thin liquids, mashed potatoes, vegetable soup, and cooked carrots WFL. Overall, pt's symptoms suggest possible esophageal component. Discussed further modifying diet and pursuing a repeat MBS to assess pt risk for aspiration, with which pt is agreeable. Recommend diet of Dys 1 textures with thin liquids pending MBS results, which will be completed as scheduling allows. Will continue to follow.  SLP Visit Diagnosis: Dysphagia, unspecified (R13.10)    Aspiration Risk  Moderate aspiration risk    Diet Recommendation Dysphagia 1 (Puree);Thin liquid    Liquid Administration via: Cup;Straw Medication Administration: Whole meds with puree Supervision: Patient able to self feed;Intermittent supervision to cue for compensatory strategies Compensations: Small sips/bites;Slow rate Postural Changes: Seated upright at 90 degrees;Remain upright for at least 30 minutes after po intake    Other  Recommendations Oral Care Recommendations: Oral care BID    Recommendations for follow up therapy are one component of a  multi-disciplinary discharge planning process, led by the attending physician.  Recommendations may be updated based on patient status, additional functional criteria and insurance authorization.  Follow up Recommendations Acute inpatient rehab (3hours/day)      Assistance Recommended at Discharge    Functional Status Assessment Patient has had a recent decline in their functional status and demonstrates the ability to make significant improvements in function in a reasonable and predictable amount of time.  Frequency and Duration min 2x/week  2 weeks       Prognosis Prognosis for improved oropharyngeal function: Good Barriers to Reach Goals: Cognitive deficits      Swallow Study   General HPI: Ryan [name] is a 62 yo male admitted from CIR 7/18 for new onset A-fib with RVR while undergoing HD. Initially presented 6/6 with lower back pain and found to have evidence of L5/S1 discitis and osteomyelitis as well as MRSA bacteremia with mitral valve endocarditis. MRI Brain 6/27 with scattered subcentimeter acute ischemic infarcts involving bilateral cerebral hemispheres and R cerebellum. Per H&P note, while in rehab medicine team consulted 7/11 for cough and dyspnea with eventual diagnosis with concerns for PNA/aspiration. MBS 7/18 with mild oral residue with good sensation to clear and an otherwise safe and efficient  oropharyngeal swallow. A regular texture diet with thin liquids was recommended at that time, however, pt requested Dys 3 textures. PMH includes anemia, ESRD, HLD, HTN Type of Study: Bedside Swallow Evaluation Previous Swallow Assessment: see HPI Diet Prior to this Study: Dysphagia 3 (mechanical soft);Thin liquids (Level 0) Temperature Spikes Noted: No Respiratory Status: Room air History of Recent Intubation: No Behavior/Cognition: Alert;Cooperative;Pleasant mood Oral Cavity Assessment: Within Functional Limits Oral Care Completed by SLP: No Oral Cavity - Dentition:  Adequate natural dentition Vision: Functional for self-feeding Self-Feeding Abilities: Able to feed self Patient Positioning: Upright in chair Baseline Vocal Quality: Normal Volitional Cough: Strong Volitional Swallow: Able to elicit    Oral/Motor/Sensory Function Overall Oral Motor/Sensory Function: Within functional limits   Ice Chips Ice chips: Not tested   Thin Liquid Thin Liquid: Within functional limits Presentation: Self Fed;Straw    Nectar Thick Nectar Thick Liquid: Not tested   Honey Thick Honey Thick Liquid: Not tested   Puree Puree: Not tested   Solid     Solid: Impaired Presentation: Self Fed Pharyngeal Phase Impairments: Cough - Immediate;Cough - Delayed      Gwynneth Aliment, M.A., CF-SLP Speech Language Pathology, Acute Rehabilitation Services  Secure Chat preferred [phone]  10/30/2022,3:29 PM

## 2022-10-30 NOTE — Progress Notes (Signed)
SLP Cancellation Note  Patient Details Name: Ryan [name] MRN: [id number] DOB: 1960-08-07   Cancelled treatment:       Reason Eval/Treat Not Completed: Patient at procedure or test/unavailable (HD). Will f/u as able.    Gwynneth Aliment, M.A., CF-SLP Speech Language Pathology, Acute Rehabilitation Services  Secure Chat preferred [phone]  10/30/2022, 9:11 AM

## 2022-10-30 NOTE — Progress Notes (Signed)
Inpatient Rehab Admissions Coordinator:   Continue to follow for medical stability.    Estill Dooms, PT, DPT Admissions Coordinator [phone] 10/30/22  10:46 AM

## 2022-10-30 NOTE — Progress Notes (Signed)
Physical Therapy Treatment Patient Details Name: Ryan [name] MRN: [id number] DOB: November 11, 1960 Today's Date: 10/30/2022   History of Present Illness 62 yo male presents to [facility] from CIR on 7/18 for afib RVR, aspiration PNA. RR on 7/21 for acute respiratory distress due to pulmonary edema requiring bipap and CRRT, and transfer to ICU. 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 S/p  HD cath L subclavian on 7/1. PMH includes HTN, HLD, ESRD on HD TTS.    PT Comments  Pt received in supine and agreeable to session. Pt requires cues for logroll technique for reduced back pain, but no physical assist. Pt able to tolerate increased gait distance with CGA and HR as high as 124bpm. Pt able to perform x5 standing trials for BLE strengthening with and without UE support with CGA and cues for anterior lean. Pt continues to benefit from PT services to progress toward functional mobility goals.    If plan is discharge home, recommend the following:     Can travel by private vehicle        Equipment Recommendations  Rollator (4 wheels)    Recommendations for Other Services       Precautions / Restrictions Precautions Precautions: Fall Precaution Comments: watch for hypotension Required Braces or Orthoses: Spinal Brace Spinal Brace: Lumbar corset;Other (comment) Spinal Brace Comments: for comfort Restrictions Weight Bearing Restrictions: No     Mobility  Bed Mobility Overal bed mobility: Needs Assistance Bed Mobility: Rolling, Sidelying to Sit Rolling: Supervision Sidelying to sit: Supervision, Used rails       General bed mobility comments: Cues for logroll technique and supervision for safety    Transfers Overall transfer level: Needs assistance Equipment used: Rolling walker (2 wheels), None Transfers: Sit to/from Stand Sit to Stand: Contact guard assist            General transfer comment: From low EOB x1 and recliner x5 with CGA for safety. Progressing from BUE support to no UE support with increased time for power up and cues for anterior lean, but no physical assist needed    Ambulation/Gait Ambulation/Gait assistance: Contact guard assist Gait Distance (Feet): 200 Feet Assistive device: Rolling walker (2 wheels) Gait Pattern/deviations: Step-through pattern Gait velocity: decr     General Gait Details: slow step-through pattern with wide turns. HR up to 124bpm      Balance Overall balance assessment: Needs assistance Sitting-balance support: No upper extremity supported, Feet supported Sitting balance-Leahy Scale: Good Sitting balance - Comments: sitting EOB   Standing balance support: Bilateral upper extremity supported, Reliant on assistive device for balance, During functional activity Standing balance-Leahy Scale: Fair Standing balance comment: with RW support, however pt able to static stand without UE support with no LOB                            Cognition Arousal: Alert Behavior During Therapy: WFL for tasks assessed/performed Overall Cognitive Status: Within Functional Limits for tasks assessed                                          Exercises      General Comments General comments (skin integrity, edema, etc.): BP 89/72 sitting EOB with pt reporting mild dizziness that resolves quickly  Pertinent Vitals/Pain Pain Assessment Pain Assessment: No/denies pain     PT Goals (current goals can now be found in the care plan section) Acute Rehab PT Goals Patient Stated Goal: back to rehab to get stronger PT Goal Formulation: With patient Time For Goal Achievement: 11/03/22 Potential to Achieve Goals: Good Progress towards PT goals: Progressing toward goals    Frequency    Min 1X/week      PT Plan Current plan remains appropriate    AM-PAC PT "6 Clicks" Mobility    Outcome Measure  Help needed turning from your back to your side while in a flat bed without using bedrails?: A Little Help needed moving from lying on your back to sitting on the side of a flat bed without using bedrails?: A Little Help needed moving to and from a bed to a chair (including a wheelchair)?: A Little Help needed standing up from a chair using your arms (e.g., wheelchair or bedside chair)?: A Little Help needed to walk in hospital room?: A Little Help needed climbing 3-5 steps with a railing? : A Little 6 Click Score: 18    End of Session Equipment Utilized During Treatment: Back brace;Gait belt Activity Tolerance: Patient tolerated treatment well Patient left: in chair;with call bell/phone within reach Nurse Communication: Mobility status PT Visit Diagnosis: Other abnormalities of gait and mobility (R26.89);Muscle weakness (generalized) (M62.81)     Time: 4098-1191 PT Time Calculation (min) (ACUTE ONLY): 21 min  Charges:    $Gait Training: 8-22 mins PT General Charges $$ ACUTE PT VISIT: 1 Visit                    Johny Shock, PTA Acute Rehabilitation Services Secure Chat Preferred  [phone]    Johny Shock 10/30/2022, 2:50 PM

## 2022-10-30 NOTE — Progress Notes (Addendum)
Progress Note   Patient: Ryan [name] NWG:[id number] DOB: Nov 01, 1960 DOA: 10/09/2022     21 DOS: the patient was seen and examined on 10/30/2022   Brief hospital course: Ryan [name] was admitted to the hospital with the working diagnosis of acute hypoxemic respiratory failure due to pneumonia in the setting of endocarditis with septic emboli.   62 yo male with the past medical history of hypertension, ESRD, and hyperlipidemia who was transferred from CIR to inpatient setting due to suspected new onset atrial fibrillation with RVR. Recent hospitalization 06/16 to 07/04 for mitral valve endocarditis, MSSA bacteremia with brain septic emboli.  One week after his discharge while in CIR he was diagnosed with pneumonia, and worsening anemia. On 07/18 he developed suspected atrial fibrillation prompting his transfer back to inpatient setting.  On his initial physical examination his blood pressure was 103/72, HR 101, RR 20 and temp 97.7, lungs with bibasilar rhonchi, heart with S1 and S2 present and irregular, abdomen with no distention and no lower extremity edema.   Na 124, K 3,9 Cl 88, bicarbonate 24, glucose 115 bun 40 cr 7,42 Wbc 14.3 hgb 7.7 plt 377  TSH 8,9   Chest radiograph with cardiomegaly, bilateral interstitial infiltrates symmetric and central, bilateral hilar vascular congestion. HD catheter in the left internal jugular vein with tip in the SVC.   EKG 135 bpm, normal axis, normal intervals, sinus rhythm with 1st degree AV block and PAC, no significant ST segment or T wave changes.   7/18 - Readmitted to [facility] for Afib RVR/SOB requiring BiPAP, inability to complete HD. 7/22 - Off of BiPAP, tolerating [place]. Remains anxious. Tolerating CRRT. 7/24 patient on sinus rhythm and off CRRT.  07/25 transferred to [facility].   08/04 patient with improvement in volume status, tolerating renal replacement therapy with ultrafiltration.  Has been placed on midodrine for blood pressure support.   Pending transfer back to CIR to continue physical therapy.  08/08 continue inpatient renal replacement therapy.   Assessment and Plan: * Multifocal atrial tachycardia Features of MAT, now on sinus rhythm.   Ruled out atrial fibrillation, (note from cardiology consultation from 07.18 concluded no atrial fibrillation).   08/04 EKG 106 bpm, normal axis, normal intervals, sinus rhythm with PAC, no significant ST segment or T wave changes.   Patient has been on diltiazem and then on metoprolol for rate control. Now due to hypotension, AV blockade has been discontinued.  Continue close follow up on telemetry.   CAP (community acquired pneumonia) Acute hypoxemic respiratory failure.   Oxygenation has improved. Plan to continue antibiotic therapy until 08/13 with cefazolin.  Continue bronchodilator therapy. His 02 saturation is 93% on room air today.  ESRD on dialysis (HCC) Hyponatremia.   Na 126, K 3,6 Cl 87, bicarbonate 21, glucose 210 bun 45, cr 4,81  P 5.1   HD today,  difficult to remove fluid due to hypotension.  Dose of midodrine now increased to 20 mg po tid.   Anemia of chronic renal disease.  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 Plan to continue with IV cefazolin.   L4 and S1 discitis and osteomyelitis, will need antibiotic therapy with IV cephalosporin until 08/13.    Hyperlipidemia Continue statin therapy.   Hypertension Hypotension.  Midodrine 20 mg tid.  Metoprolol has been discontinued.   Echocardiogram with preserved LV systolic function EF 55 to 60%, moderate LVH, RV  systolic function preserved, LA with mild dilatation, large rounded structure on the anterior leflet of the mitral valve, partially obstructing mitral inflow with ball-valve type motion. No mitral stenosis or mitral regurgitation. Mild aortic valve stenosis.    Moderate  protein malnutrition (HCC) Continue nutritional supplementation,.         Subjective: Patient with no chest pain, continue to have dyspnea, his blood pressure has been low.   Physical Exam: Vitals:   10/30/22 1100 10/30/22 1130 10/30/22 1150 10/30/22 1247  BP: 99/73 103/71 96/65 (!) 88/62  Pulse: (!) 106 100 (!) 101 (!) 105  Resp: 13 (!) 25 15 17   Temp:   98 F (36.7 C) 98.2 F (36.8 C)  TempSrc:   Oral Oral  SpO2: 95% 96% 98% 93%  Weight:   71 kg   Height:       Neurology awake and alert ENT with mild pallor Cardiovascular with S1 and S2 present and regular, positive murmur at the apex,  Positive JVD No lower extremity edema Respiratory with rales at bases with no wheezing or rhonchi Abdomen with no distention  Data Reviewed:    Family Communication: no family at the bedside   Disposition: Status is: Inpatient Remains inpatient appropriate because: volume overload, on renal replacement therapy, limited by hypotension   Planned Discharge Destination: Rehab     Author: Coralie Keens, MD 10/30/2022 1:42 PM  For on call review [email or web address].

## 2022-10-31 ENCOUNTER — Inpatient Hospital Stay (HOSPITAL_COMMUNITY): Payer: No Typology Code available for payment source

## 2022-10-31 DIAGNOSIS — J189 Pneumonia, unspecified organism: Secondary | ICD-10-CM | POA: Diagnosis not present

## 2022-10-31 DIAGNOSIS — I4719 Other supraventricular tachycardia: Secondary | ICD-10-CM | POA: Diagnosis not present

## 2022-10-31 DIAGNOSIS — E785 Hyperlipidemia, unspecified: Secondary | ICD-10-CM | POA: Diagnosis not present

## 2022-10-31 DIAGNOSIS — N186 End stage renal disease: Secondary | ICD-10-CM | POA: Diagnosis not present

## 2022-10-31 DIAGNOSIS — Z992 Dependence on renal dialysis: Secondary | ICD-10-CM | POA: Diagnosis not present

## 2022-10-31 DIAGNOSIS — B9561 Methicillin susceptible Staphylococcus aureus infection as the cause of diseases classified elsewhere: Secondary | ICD-10-CM | POA: Diagnosis not present

## 2022-10-31 DIAGNOSIS — I39 Endocarditis and heart valve disorders in diseases classified elsewhere: Secondary | ICD-10-CM | POA: Diagnosis not present

## 2022-10-31 DIAGNOSIS — E440 Moderate protein-calorie malnutrition: Secondary | ICD-10-CM | POA: Diagnosis not present

## 2022-10-31 DIAGNOSIS — I10 Essential (primary) hypertension: Secondary | ICD-10-CM | POA: Diagnosis not present

## 2022-10-31 DIAGNOSIS — N250 Renal osteodystrophy: Secondary | ICD-10-CM | POA: Diagnosis not present

## 2022-10-31 DIAGNOSIS — R7881 Bacteremia: Secondary | ICD-10-CM | POA: Diagnosis not present

## 2022-10-31 DIAGNOSIS — D631 Anemia in chronic kidney disease: Secondary | ICD-10-CM | POA: Diagnosis not present

## 2022-10-31 DIAGNOSIS — I120 Hypertensive chronic kidney disease with stage 5 chronic kidney disease or end stage renal disease: Secondary | ICD-10-CM | POA: Diagnosis not present

## 2022-10-31 LAB — MAGNESIUM: Magnesium: 1.6 mg/dL — ABNORMAL LOW (ref 1.7–2.4)

## 2022-10-31 MED ORDER — MAGNESIUM SULFATE 2 GM/50ML IV SOLN
2.0000 g | Freq: Once | INTRAVENOUS | Status: AC
  Administered 2022-10-31: 2 g via INTRAVENOUS
  Filled 2022-10-31: qty 50

## 2022-10-31 NOTE — Progress Notes (Signed)
[facility] Progress Note   Subjective:    Seen and examined patient at bedside. Tolerated yesterday's HD with net UF 2.5L. Looks better today. On RA. Next HD 8/10.  Objective Vitals:   10/31/22 0005 10/31/22 0542 10/31/22 0742 10/31/22 0753  BP: 93/67 96/62  97/66  Pulse: (!) 103 (!) 102    Resp: 18 16 20    Temp:  97.6 F (36.4 C)  98.5 F (36.9 C)  TempSrc:  Oral  Axillary  SpO2: 97% 98%    Weight:  70.7 kg    Height:       Physical Exam General: Awake, alert, on RA, NAD Heart: S1 and S2; No MRGs Lungs: Breathing unlabored; diminished posteriorly with fine rales (treating for PNA) Abdomen:Soft and non-tender Extremities:No LE edema Dialysis Access: [facility]   Filed Weights   10/30/22 0807 10/30/22 1150 10/31/22 0542  Weight: 73.5 kg 71 kg 70.7 kg   No intake or output data in the 24 hours ending 10/31/22 1232  Additional Objective Labs: Basic Metabolic Panel: Recent Labs  Lab 10/29/22 0148 10/29/22 2316 10/31/22 0522  NA 128* 126* 126*  K 3.7 3.6 3.5  CL 88* 87* 86*  CO2 22 21* 22  GLUCOSE 106* 120* 116*  BUN 27* 45* 35*  CREATININE 3.26* 4.81* 4.36*  CALCIUM 9.2 9.0 9.2  PHOS 3.9 5.1* 4.6   Liver Function Tests: Recent Labs  Lab 10/29/22 0148 10/29/22 2316 10/31/22 0522  ALBUMIN 3.2* 3.0* 3.1*   No results for input(s): "LIPASE", "AMYLASE" in the last 168 hours. CBC: Recent Labs  Lab 10/25/22 0414 10/27/22 0409 10/27/22 1158 10/29/22 0148  WBC 11.0* 11.7* 11.3* 9.9  NEUTROABS  --   --   --  7.1  HGB 9.5* 9.5* 9.3* 9.4*  HCT 31.0* 30.3* 30.3* 31.2*  MCV 80.7 79.5* 80.4 83.9  PLT 244 283 285 319   Blood Culture    Component Value Date/Time   SDES BLOOD BLOOD RIGHT ARM 10/05/2022 2032   SPECREQUEST  10/05/2022 2032    BOTTLES DRAWN AEROBIC AND ANAEROBIC Blood Culture adequate volume   CULT  10/05/2022 2032    NO GROWTH 5 DAYS Performed at [facility], [address], [place], [place] [address]    REPTSTATUS  10/10/2022 FINAL 10/05/2022 2032    Cardiac Enzymes: No results for input(s): "CKTOTAL", "CKMB", "CKMBINDEX", "TROPONINI" in the last 168 hours. CBG: No results for input(s): "GLUCAP" in the last 168 hours. Iron Studies: No results for input(s): "IRON", "TIBC", "TRANSFERRIN", "FERRITIN" in the last 72 hours. Lab Results  Component Value Date   INR 1.4 (H) 10/10/2022   INR 1.4 (H) 09/10/2022   INR 1.13 09/23/2017   Studies/Results: DG Swallowing Func-Speech Pathology  Result Date: 10/31/2022 Table formatting from the original result was not included. Modified Barium Swallow Study Patient Details Name: IRE BOOTH MRN: [id number] Date of Birth: 07/26/1960 Today's Date: 10/31/2022 HPI/PMH: HPI: Ryan [name] is a 62 yo male admitted from CIR 7/18 for new onset A-fib with RVR while undergoing HD. Initially presented 6/6 with lower back pain and found to have evidence of L5/S1 discitis and osteomyelitis as well as MRSA bacteremia with mitral valve endocarditis. MRI Brain 6/27 with scattered subcentimeter acute ischemic infarcts involving bilateral cerebral hemispheres and R cerebellum. Per H&P note, while in rehab medicine team consulted 7/11 for cough and dyspnea with eventual diagnosis with concerns for PNA/aspiration. MBS 7/18 with mild oral residue with good sensation to clear and an otherwise  safe and efficient oropharyngeal swallow. A regular texture diet with thin liquids was recommended at that time, however, pt requested Dys 3 textures. PMH includes anemia, ESRD, HLD, HTN Clinical Impression: Clinical Impression: Pt presents with a functional oropharyngeal swallow. With larger boluses, pt appears to swallow majority of bolus effectively, although with some residue in his oral cavity that he promptly initiates lingual motion to clear. Despite pt's sensation of oral residue and initiation of lingual motion to propel bolus posteriorly, it then pools in the valleculae with no apparent  sensation and pt required cueing to initiate subsequent swallow. However, this is effective in completely clearing pharyngeal residue. Pt thoroughly masticated solids with prompt swallow initiation and no oropharyngeal residue noted. One throat clear observed during study, which was not related to inadequate airway protection. No penetration/aspiration noted with any trials of thin liquids, nectar thick liquids, purees, or solids. When taking the pill, pt required multiple sips of thin liquids to clear from his oral cavity before gagging as it progressed through his pharynx. A subsequent spoonful of puree was effective in clearing pill from the pharynx, although with retention noted in the esophagus that required another spoonful of puree to clear. Due to pt's reports of globus sensation and gagging with meal trays, suspect esophageal component, although note esophagram with no acute concerns. Recommend pt initiate diet of regular textures with thin liquids, although pt states his preference is to continue diet of Dys 1 textures due to subjective reports that it "goes down easier". SLP provided education regarding use of multiple swallows during meals, with which he verbalized understanding. Pt able to upgrade diet as he feels comfortable with consuming more solid textures. No further SLP f/u is necessary as pt presents with functional swallow. Will s/o at this time. Factors that may increase risk of adverse event in presence of aspiration [name] & [name] 2021): Factors that may increase risk of adverse event in presence of aspiration [name] & [name] 2021): Respiratory or GI disease Recommendations/Plan: Swallowing Evaluation Recommendations Swallowing Evaluation Recommendations Recommendations: PO diet PO Diet Recommendation: Dysphagia 1 (Pureed); Thin liquids (Level 0) Liquid Administration via: Cup; Straw Medication Administration: Whole meds with liquid (with purees PRN for larger pills) Supervision: Patient  able to self-feed Swallowing strategies  : Minimize environmental distractions; Small bites/sips; Slow rate Postural changes: Stay upright 30-60 min after meals; Position pt fully upright for meals Oral care recommendations: Oral care BID (2x/day); Pt independent with oral care Treatment Plan Treatment Plan Treatment recommendations: No treatment recommended at this time Follow-up recommendations: Acute inpatient rehab (3 hours/day) (for cognitive goals) Recommendations Comment: no dysphagia intervention required, conitnue w/ current POC for cognition Functional status assessment: Patient has not had a recent decline in their functional status. Recommendations Recommendations for follow up therapy are one component of a multi-disciplinary discharge planning process, led by the attending physician.  Recommendations may be updated based on patient status, additional functional criteria and insurance authorization. Assessment: Orofacial Exam: Orofacial Exam Oral Cavity: Oral Hygiene: WFL Oral Cavity - Dentition: Adequate natural dentition Orofacial Anatomy: WFL Oral Motor/Sensory Function: WFL Anatomy: Anatomy: WFL Boluses Administered: Boluses Administered Boluses Administered: Thin liquids (Level 0); Mildly thick liquids (Level 2, nectar thick); Moderately thick liquids (Level 3, honey thick); Puree; Solid  Oral Impairment Domain: Oral Impairment Domain Lip Closure: No labial escape Tongue control during bolus hold: Cohesive bolus between tongue to palatal seal Bolus preparation/mastication: Timely and efficient chewing and mashing Bolus transport/lingual motion: Brisk tongue motion Oral residue: Complete oral clearance  Location of oral residue : N/A Initiation of pharyngeal swallow : Posterior laryngeal surface of the epiglottis  Pharyngeal Impairment Domain: Pharyngeal Impairment Domain Soft palate elevation: No bolus between soft palate (SP)/pharyngeal wall (PW) Laryngeal elevation: Complete superior movement of  thyroid cartilage with complete approximation of arytenoids to epiglottic petiole Anterior hyoid excursion: Complete anterior movement Epiglottic movement: Complete inversion Laryngeal vestibule closure: Complete, no air/contrast in laryngeal vestibule Pharyngeal stripping wave : Present - complete Pharyngeal contraction (A/P view only): N/A Pharyngoesophageal segment opening: Complete distension and complete duration, no obstruction of flow Tongue base retraction: Narrow column of contrast or air between tongue base and PPW Pharyngeal residue: Collection of residue within or on pharyngeal structures Location of pharyngeal residue: Valleculae; Pyriform sinuses  Esophageal Impairment Domain: Esophageal Impairment Domain Esophageal clearance upright position: Esophageal retention Pill: Pill Consistency administered: Thin liquids (Level 0) Thin liquids (Level 0): Impaired (see clinical impressions) Penetration/Aspiration Scale Score: Penetration/Aspiration Scale Score 1.  Material does not enter airway: Thin liquids (Level 0); Mildly thick liquids (Level 2, nectar thick); Moderately thick liquids (Level 3, honey thick); Puree; Solid; Pill Compensatory Strategies: Compensatory Strategies Compensatory strategies: Yes Straw: Effective Effective Straw: Thin liquid (Level 0)   General Information: Caregiver present: No  Diet Prior to this Study: Dysphagia 1 (pureed); Thin liquids (Level 0)   Temperature : Normal   Respiratory Status: WFL   Supplemental O2: None (Room air)   History of Recent Intubation: No  Behavior/Cognition: Alert; Cooperative; Pleasant mood Self-Feeding Abilities: Able to self-feed Baseline vocal quality/speech: Normal Volitional Cough: Able to elicit Volitional Swallow: Able to elicit Exam Limitations: No limitations Goal Planning: Prognosis for improved oropharyngeal function: Good No data recorded No data recorded Patient/Family Stated Goal: none stated Consulted and agree with results and  recommendations: Patient Pain: Pain Assessment Pain Assessment: No/denies pain End of Session: Start Time:SLP Start Time (ACUTE ONLY): 0858 Stop Time: SLP Stop Time (ACUTE ONLY): 0920 Time Calculation:SLP Time Calculation (min) (ACUTE ONLY): 22 min Charges: SLP Evaluations $ SLP Speech Visit: 1 Visit SLP Evaluations $BSS Swallow: 1 Procedure $MBS Swallow: 1 Procedure SLP visit diagnosis: SLP Visit Diagnosis: Dysphagia, unspecified (R13.10) Past Medical History: Past Medical History: Diagnosis Date  Allergy   Anemia   Blood transfusion without reported diagnosis   Dialysis patient (HCC)   Tues,thurs,sat  ESRD (end stage renal disease) (HCC)   TTHS-   Family history of adverse reaction to anesthesia   father had allergic reaction with anesthesia with a dental procedure-(pt. doesn't know)  Hyperlipidemia   Hypertension  Past Surgical History: Past Surgical History: Procedure Laterality Date  A/V FISTULAGRAM Left 12/15/2016  Procedure: A/V Fistulagram - left;  Surgeon: Chuck Hint, MD;  Location: [facility] INVASIVE CV LAB;  Service: Cardiovascular;  Laterality: Left;  AV FISTULA PLACEMENT Left 08/28/2016  Procedure: LEFT UPPER  ARM ARTERIOVENOUS (AV) FISTULA CREATION;  Surgeon: Chuck Hint, MD;  Location: [facility] OR;  Service: Vascular;  Laterality: Left;  DIALYSIS/PERMA CATHETER INSERTION Right 12/21/2017  Procedure: INSERTION OF DIALYSIS CATHETER Right Internal Jugular .;  Surgeon: Sherren Kerns, MD;  Location: [facility] OR;  Service: Vascular;  Laterality: Right;  FISTULA SUPERFICIALIZATION Left 09/23/2017  Procedure: FISTULA PLICATION LEFT ARM;  Surgeon: Sherren Kerns, MD;  Location: [facility] OR;  Service: Vascular;  Laterality: Left;  FISTULOGRAM Left 12/21/2017  Procedure: FISTULOGRAM with Balloon Angioplasty.;  Surgeon: Sherren Kerns, MD;  Location: [facility] OR;  Service: Vascular;  Laterality: Left;  HEMATOMA EVACUATION Left 08/27/2020  Procedure: EVACUATION HEMATOMA LEFT  ARM;  Surgeon: Sherren Kerns, MD;   Location: [facility] OR;  Service: Vascular;  Laterality: Left;  IR FLUORO GUIDE CV LINE LEFT  09/22/2022  IR FLUORO GUIDE CV LINE RIGHT  09/12/2022  IR FLUORO GUIDE CV LINE RIGHT  09/17/2022  IR REMOVAL TUN CV CATH W/O FL  09/09/2022  IR THORACENTESIS ASP PLEURAL SPACE W/IMG GUIDE  08/22/2017  1.2 L -right-sided  IR THORACENTESIS ASP PLEURAL SPACE W/IMG GUIDE  10/19/2017  IR US GUIDE VASC ACCESS LEFT  09/22/2022  IR US GUIDE VASC ACCESS RIGHT  09/12/2022  KIDNEY TRANSPLANT    REVISON OF ARTERIOVENOUS FISTULA Left 12/21/2017  Procedure: PLICATION and Ligation of F LEFT ARM ARTERIOVENOUS FISTULA;  Surgeon: Sherren Kerns, MD;  Location: [facility] OR;  Service: Vascular;  Laterality: Left;  REVISON OF ARTERIOVENOUS FISTULA Left 07/16/2020  Procedure: EXCISION OF LEFT ARM ARTERIOVENOUS FISTULA;  Surgeon: Sherren Kerns, MD;  Location: [facility] OR;  Service: Vascular;  Laterality: Left;  stent in kidneys    dec 2017  TRANSTHORACIC ECHOCARDIOGRAM  09/22/2017   Severe LVH.  Normal function -EF 55-60%.  GRII DD.  Moderate RV dilation with mildly reduced RV function.   Fixed right coronary cusp with very mild aortic stenosis.  The myocardium has a speckled appearance --> .   Recommend cardiac MRI to evaluate for amyloid  UPPER EXTREMITY VENOGRAPHY Bilateral 06/01/2020  Procedure: UPPER EXTREMITY VENOGRAPHY;  Surgeon: Chuck Hint, MD;  Location: [facility] INVASIVE CV LAB;  Service: Cardiovascular;  Laterality: Bilateral; Gwynneth Aliment, M.A., CF-SLP Speech Language Pathology, Acute Rehabilitation Services Secure Chat preferred [phone] 10/31/2022, 10:09 AM   Medications:  sodium chloride 10 mL/hr at 10/18/22 2141    ceFAZolin (ANCEF) IV 2 g (10/30/22 1754)    (feeding supplement) PROSource Plus  30 mL Oral TID WC   arformoterol  15 mcg Nebulization BID   benzonatate  200 mg Oral TID   Chlorhexidine Gluconate Cloth  6 each Topical Q0600   Darbepoetin Alfa  150 mcg Subcutaneous Q Thu-1800   feeding supplement (NEPRO CARB STEADY)  237  mL Oral TID BM   heparin  5,000 Units Subcutaneous Q8H   midodrine  20 mg Oral TID WC   multivitamin  1 tablet Oral QHS   pantoprazole  40 mg Oral Daily   revefenacin  175 mcg Nebulization Daily    Dialysis Orders: TTS at [facility] 3:45hr, 450/A1.5, EDW 85.7kg, 2K/2Ca bath, TDC, heparin 3000 unit bolus - no ESA - Calcitriol 1.59mcg PO q HD  Assessment/Plan: MSSA bacteremia/endocarditis/L5-S1 discitis: Blood Cx 6/16 MSSA, negative Cx on 6/17, 6/19. S/p TDC line holiday. TTE was concerning for endocarditis. TEE cancelled d/t high risk factors. LS MRI with possible L5-S1 discitis. On IV Cefazolin 2g q HD x 6 weeks (until 11/04/22). Hyponatremia: Likely 2nd volume overload. Received HD 8/5 and 8/6 (see below). Na level improving, now 126. Monitor trend. BP/Volume overload: Hypotension preventing adequate UF. Required ICU transfer for CRRT 7/21 - 10/15/22, able to get down to ~ 74kg range. Weight back up to 79kg on 7/26 - dialyzed but unable to get any fluid off due to hypotension and intermittent tachycardia. Fluid restrictions disc. Metoprolol stopped and Midodrine raised to 20mg  TID-okay to give an extra 20mg  mid-run during HD if needed. Keep EDWs low, last weight is 70.7kg. Deconditioning /weakness: was in CIR, was transferred back to 6E due to  medical complications. He's looking better today. Anticipate he can return back to CIR soon.  ESRD: . Usual TTS schedule,  see above, next hD 8/10.Marland Kitchen Access: TDC pulled 6/18 for line holiday, then new LIJ TDC placed 7/01 per IR. For perm access: s/p L AVF ligation in 2019. Last seen by Dr. Edilia Bo 01/2022 - limited mobility in R arm so planning for L arm graft but had not scheduled surgery yet. Can revisit as an outpatient once bacteremia resolved.  Anemia (ESRD + ABLA, + FOBT): Hgb 9.4. Continue Aranesp q Thursday.   CKD-MBD: CorrCa ^^, hold VDRA. Phos fluctuating, back to goal, hold off on binders for now. Nutrition - Renal diet, Alb low - continue  supplement Dispo - remains inpatient. Patient is looking better today and appropriate to transfer back to CIR. Patient must be on a renal diet with fluid restriction in CIR to prevent acute volume overload.   Salome Holmes, NP [facility] 10/31/2022,12:32 PM  LOS: 22 days

## 2022-10-31 NOTE — Progress Notes (Signed)
TRH night cross cover note:   I was notified by RN that the patient's magnesium level this morning is 1.6.  I subsequently ordered magnesium sulfate 2 g IV over 2 hours.     Newton Pigg, DO Hospitalist

## 2022-10-31 NOTE — Progress Notes (Signed)
Mobility Specialist: Progress Note   10/31/22 1145  Mobility  Activity Ambulated with assistance in hallway  Level of Assistance Contact guard assist, steadying assist  Assistive Device Front wheel walker  Distance Ambulated (ft) 250 ft  Activity Response Tolerated well  Mobility Referral Yes  $Mobility charge 1 Mobility  Mobility Specialist Start Time (ACUTE ONLY) 1120  Mobility Specialist Stop Time (ACUTE ONLY) 1133  Mobility Specialist Time Calculation (min) (ACUTE ONLY) 13 min   Pre-Mobility: HR 104 During Mobility: HR 111-123 Post-Mobility: HR 104  Pt was agreeable to mobility session - received in bed. Assisted pt with putting on lumbar brace. SV for STS, CG for ambulation. No complaints throughout. Returned to chair with all needs met.   Maurene Capes Mobility Specialist Please contact via SecureChat or Rehab office at [phone]

## 2022-10-31 NOTE — Plan of Care (Signed)
°  Problem: Clinical Measurements: °Goal: Ability to maintain clinical measurements within normal limits will improve °Outcome: Progressing °Goal: Will remain free from infection °Outcome: Progressing °Goal: Diagnostic test results will improve °Outcome: Progressing °Goal: Respiratory complications will improve °Outcome: Progressing °  °

## 2022-10-31 NOTE — Plan of Care (Signed)
  Problem: Activity: Goal: Risk for activity intolerance will decrease Outcome: Progressing   Problem: Nutrition: Goal: Adequate nutrition will be maintained Outcome: Progressing   Problem: Safety: Goal: Ability to remain free from injury will improve Outcome: Progressing   Problem: Activity: Goal: Risk for activity intolerance will decrease Outcome: Progressing   Problem: Pain Managment: Goal: General experience of comfort will improve Outcome: Progressing   Problem: Skin Integrity: Goal: Risk for impaired skin integrity will decrease Outcome: Progressing

## 2022-10-31 NOTE — Progress Notes (Signed)
Modified Barium Swallow Study  Patient Details  Name: Ryan [name] MRN: [id number] Date of Birth: Jun 17, 1960  Today's Date: 10/31/2022  Modified Barium Swallow completed.  Full report located under Chart Review in the Imaging Section.  History of Present Illness Ryan [name] is a 62 yo male admitted from CIR 7/18 for new onset A-fib with RVR while undergoing HD. Initially presented 6/6 with lower back pain and found to have evidence of L5/S1 discitis and osteomyelitis as well as MRSA bacteremia with mitral valve endocarditis. MRI Brain 6/27 with scattered subcentimeter acute ischemic infarcts involving bilateral cerebral hemispheres and R cerebellum. Per H&P note, while in rehab medicine team consulted 7/11 for cough and dyspnea with eventual diagnosis with concerns for PNA/aspiration. MBS 7/18 with mild oral residue with good sensation to clear and an otherwise safe and efficient oropharyngeal swallow. A regular texture diet with thin liquids was recommended at that time, however, pt requested Dys 3 textures. PMH includes anemia, ESRD, HLD, HTN   Clinical Impression Pt presents with a functional oropharyngeal swallow. With larger boluses, pt appears to swallow majority of bolus effectively, although with some residue in his oral cavity that he promptly initiates lingual motion to clear. Despite pt's sensation of oral residue and initiation of lingual motion to propel bolus posteriorly, it then pools in the valleculae with no apparent sensation and pt required cueing to initiate subsequent swallow. However, this is effective in completely clearing pharyngeal residue. Pt thoroughly masticated solids with prompt swallow initiation and no oropharyngeal residue noted. One throat clear observed during study, which was not related to inadequate airway protection. No penetration/aspiration noted with any trials of thin liquids, nectar thick liquids, purees, or solids. When taking the pill, pt  required multiple sips of thin liquids to clear from his oral cavity before gagging as it progressed through his pharynx. A subsequent spoonful of puree was effective in clearing pill from the pharynx, although with retention noted in the esophagus that required another spoonful of puree to clear. Due to pt's reports of globus sensation and gagging with meal trays, suspect esophageal component, although note esophagram with no acute concerns. Recommend pt initiate diet of regular textures with thin liquids, although pt states his preference is to continue diet of Dys 1 textures due to subjective reports that it "goes down easier". SLP provided education regarding use of multiple swallows during meals, with which he verbalized understanding. Pt able to upgrade diet as he feels comfortable with consuming more solid textures. No further SLP f/u is necessary as pt presents with functional swallow. Will s/o at this time. Factors that may increase risk of adverse event in presence of aspiration Ryan [name] & Clearance Ryan [name]): Respiratory or GI disease  Swallow Evaluation Recommendations Recommendations: PO diet PO Diet Recommendation: Dysphagia 1 (Pureed);Thin liquids (Level 0) Liquid Administration via: Cup;Straw Medication Administration: Whole meds with liquid (with purees PRN for larger pills) Supervision: Patient able to self-feed Swallowing strategies  : Minimize environmental distractions;Small bites/sips;Slow rate Postural changes: Stay upright 30-60 min after meals;Position pt fully upright for meals Oral care recommendations: Oral care BID (2x/day);Pt independent with oral care   Ryan [name], M.A., CF-SLP Speech Language Pathology, Acute Rehabilitation Services  Secure Chat preferred [phone]  10/31/2022,10:06 AM

## 2022-10-31 NOTE — Progress Notes (Signed)
Occupational Therapy Treatment Patient Details Name: Ryan [name] MRN: [id number] DOB: July 14, 1960 Today's Date: 10/31/2022   History of present illness 62 yo male presents to [facility] from CIR on 7/18 for afib RVR, aspiration PNA. RR on 7/21 for acute respiratory distress due to pulmonary edema requiring bipap and CRRT, and transfer to ICU. 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 S/p  HD cath L subclavian on 7/1. PMH includes HTN, HLD, ESRD on HD TTS.   OT comments  Pt seated in straight back chair, reports back feeling more comfortable in that chair. Had been up x 2+ hours. Pt ambulated with up to min assist without AD to bathroom and then sink. Completed pericare with set up and grooming with CGA. Pt returned to bed at end of session with all needs met.       If plan is discharge home, recommend the following:  Assistance with cooking/housework;Help with stairs or ramp for entrance;Assist for transportation;Direct supervision/assist for medications management;Direct supervision/assist for financial management;A little help with walking and/or transfers;A little help with bathing/dressing/bathroom   Equipment Recommendations  Tub/shower bench;BSC/3in1    Recommendations for Other Services      Precautions / Restrictions Precautions Precautions: Fall Required Braces or Orthoses: Spinal Brace Spinal Brace: Lumbar corset Spinal Brace Comments: for comfort Restrictions Weight Bearing Restrictions: No       Mobility Bed Mobility Overal bed mobility: Modified Independent                  Transfers Overall transfer level: Needs assistance Equipment used: None Transfers: Sit to/from Stand Sit to Stand: Contact guard assist           General transfer comment: from straight back chair and low toilet with reliance onf UEs     Balance Overall  balance assessment: Needs assistance Sitting-balance support: No upper extremity supported, Feet supported Sitting balance-Leahy Scale: Good     Standing balance support: No upper extremity supported Standing balance-Leahy Scale: Fair                             ADL either performed or assessed with clinical judgement   ADL Overall ADL's : Needs assistance/impaired     Grooming: Contact guard assist;Wash/dry hands;Standing                   Toilet Transfer: Minimal assistance;Ambulation;Regular Toilet;Grab bars   Toileting- Clothing Manipulation and Hygiene: Set up;Sitting/lateral lean       Functional mobility during ADLs: Minimal assistance General ADL Comments: pt reluctant, but was able to use  standard height toilet with use of grab bar, did not use AD to ambulate from chair to bathroom to bed    Extremity/Trunk Assessment              Vision       Perception     Praxis      Cognition Arousal: Alert Behavior During Therapy: [facility] for tasks assessed/performed Overall Cognitive Status: Within Functional Limits for tasks assessed                                          Exercises      Shoulder Instructions       General Comments  Pertinent Vitals/ Pain       Pain Assessment Pain Assessment: Faces Faces Pain Scale: Hurts a little bit Pain Location: back Pain Descriptors / Indicators: Discomfort  Home Living                                          Prior Functioning/Environment              Frequency  Min 1X/week        Progress Toward Goals  OT Goals(current goals can now be found in the care plan section)  Progress towards OT goals: Progressing toward goals  Acute Rehab OT Goals OT Goal Formulation: With patient Time For Goal Achievement: 11/05/22 Potential to Achieve Goals: Good  Plan Discharge plan remains appropriate    Co-evaluation                 AM-PAC  OT "6 Clicks" Daily Activity     Outcome Measure   Help from another person eating meals?: None Help from another person taking care of personal grooming?: A Little Help from another person toileting, which includes using toliet, bedpan, or urinal?: A Little Help from another person bathing (including washing, rinsing, drying)?: A Little Help from another person to put on and taking off regular upper body clothing?: A Little Help from another person to put on and taking off regular lower body clothing?: A Little 6 Click Score: 19    End of Session Equipment Utilized During Treatment: Gait belt  OT Visit Diagnosis: Unsteadiness on feet (R26.81)   Activity Tolerance Patient tolerated treatment well   Patient Left in bed;with call bell/phone within reach   Nurse Communication          Time: 8119-1478 OT Time Calculation (min): 23 min  Charges: OT General Charges $OT Visit: 1 Visit OT Treatments $Self Care/Home Management : 23-37 mins  Berna Spare, OTR/L Acute Rehabilitation Services Office: [phone]   Evern Bio 10/31/2022, 3:29 PM

## 2022-10-31 NOTE — Progress Notes (Signed)
Progress Note   Patient: ARA REAGER ZOX:[id number] DOB: [birth year] DOA: 10/09/2022     22 DOS: the patient was seen and examined on 10/31/2022   Brief hospital course: Mr. Carkhuff was admitted to the hospital with the working diagnosis of acute hypoxemic respiratory failure due to pneumonia in the setting of endocarditis with septic emboli.   62 yo male with the past medical history of hypertension, ESRD, and hyperlipidemia who was transferred from CIR to inpatient setting due to suspected new onset atrial fibrillation with RVR. Recent hospitalization 06/16 to 07/04 for mitral valve endocarditis, MSSA bacteremia with brain septic emboli.  One week after his discharge while in CIR he was diagnosed with pneumonia, and worsening anemia. On 07/18 he developed suspected atrial fibrillation prompting his transfer back to inpatient setting.  On his initial physical examination his blood pressure was 103/72, HR 101, RR 20 and temp 97.7, lungs with bibasilar rhonchi, heart with S1 and S2 present and irregular, abdomen with no distention and no lower extremity edema.   Na 124, K 3,9 Cl 88, bicarbonate 24, glucose 115 bun 40 cr 7,42 Wbc 14.3 hgb 7.7 plt 377  TSH 8,9   Chest radiograph with cardiomegaly, bilateral interstitial infiltrates symmetric and central, bilateral hilar vascular congestion. HD catheter in the left internal jugular vein with tip in the SVC.   EKG 135 bpm, normal axis, normal intervals, sinus rhythm with 1st degree AV block and PAC, no significant ST segment or T wave changes.   7/18 - Readmitted to [facility] for Afib RVR/SOB requiring BiPAP, inability to complete HD. 7/22 - Off of BiPAP, tolerating [place]. Remains anxious. Tolerating CRRT. 7/24 patient on sinus rhythm and off CRRT.  07/25 transferred to [facility].   08/04 patient with improvement in volume status, tolerating renal replacement therapy with ultrafiltration.  Has been placed on midodrine for blood pressure support.   Pending transfer back to CIR to continue physical therapy.  08/08 continue inpatient renal replacement therapy.   Assessment and Plan: * Multifocal atrial tachycardia Features of MAT, now on sinus rhythm.   Ruled out atrial fibrillation, (note from cardiology consultation from 07.18 concluded no atrial fibrillation).   08/04 EKG 106 bpm, normal axis, normal intervals, sinus rhythm with PAC, no significant ST segment or T wave changes.   Patient has been on diltiazem and then on metoprolol for rate control. Now due to hypotension, AV blockade has been discontinued.  Sinus rhythm on telemetry.   CAP (community acquired pneumonia) Acute hypoxemic respiratory failure.   Oxygenation has improved. Plan to continue antibiotic therapy until 08/13 with cefazolin.  Continue bronchodilator therapy. His 02 saturation is 99% on room air today.  ESRD on dialysis (HCC) Hyponatremia.   Serum Na today 126, K 3,5 and bicarbonate 22. BUN 35 Mg 1,6   Continue blood pressure support with midodrine (20 mg tid) for renal replacement therapy.   Add Mag IV for correction,   Anemia of chronic renal disease.  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 Plan to continue with IV cefazolin.   L4 and S1 discitis and osteomyelitis, will need antibiotic therapy with IV cephalosporin until 08/13.    Hyperlipidemia Continue statin therapy.   Hypertension Hypotension.  Midodrine 20 mg tid.  Metoprolol has been discontinued.   Echocardiogram with preserved LV systolic function EF 55 to 60%, moderate LVH, RV systolic function preserved, LA with mild dilatation,  large rounded structure on the anterior leflet of the mitral valve, partially obstructing mitral inflow with ball-valve type motion. No mitral stenosis or mitral regurgitation. Mild aortic valve stenosis.    Moderate protein  malnutrition (HCC) Continue nutritional supplementation,.         Subjective: Patient is feeling better, no chest pain or dyspnea, no edema.   Physical Exam: Vitals:   10/31/22 0542 10/31/22 0742 10/31/22 0753 10/31/22 1600  BP: 96/62  97/66 90/62  Pulse: (!) 102     Resp: 16 20    Temp: 97.6 F (36.4 C)  98.5 F (36.9 C) 98.5 F (36.9 C)  TempSrc: Oral  Axillary Axillary  SpO2: 98%     Weight: 70.7 kg     Height:       Neurology awake and alert, deconditioned ENT with mild pallor Cardiovascular with S1 and S2 present and regular, positive systolic murmur at the apex Respiratory with no rales or wheezing, no rhonchi Abdomen with no distention  No lower extremity edema  Data Reviewed:    Family Communication: no family at the bedside   Disposition: Status is: Inpatient Remains inpatient appropriate because: pending transfer to CIR.  Planned Discharge Destination: Rehab    Author: Coralie Keens, MD 10/31/2022 4:24 PM  For on call review [email or web address].

## 2022-11-01 MED ORDER — LIDOCAINE-PRILOCAINE 2.5-2.5 % EX CREA
1.0000 | TOPICAL_CREAM | CUTANEOUS | Status: DC | PRN
  Filled 2022-11-01: qty 5

## 2022-11-01 MED ORDER — MAGNESIUM SULFATE 2 GM/50ML IV SOLN
2.0000 g | Freq: Once | INTRAVENOUS | Status: AC
  Administered 2022-11-01: 2 g via INTRAVENOUS
  Filled 2022-11-01: qty 50

## 2022-11-01 MED ORDER — ALTEPLASE 2 MG IJ SOLR: 2.0000 mg | Freq: Once | INTRAMUSCULAR | Status: DC | PRN

## 2022-11-01 MED ORDER — HEPARIN SODIUM (PORCINE) 1000 UNIT/ML IJ SOLN
INTRAMUSCULAR | Status: AC
  Administered 2022-11-01: 3000 [IU]
  Filled 2022-11-01: qty 3

## 2022-11-01 MED ORDER — HEPARIN SODIUM (PORCINE) 1000 UNIT/ML DIALYSIS: 1000.0000 [IU] | INTRAMUSCULAR | Status: DC | PRN

## 2022-11-01 MED ORDER — LACTATED RINGERS IV BOLUS: 250.0000 mL | Freq: Once | INTRAVENOUS | Status: DC

## 2022-11-01 MED ORDER — LIDOCAINE-PRILOCAINE 2.5-2.5 % EX CREA: 1.0000 | TOPICAL_CREAM | CUTANEOUS | Status: DC | PRN

## 2022-11-01 MED ORDER — LIDOCAINE HCL (PF) 1 % IJ SOLN: 5.0000 mL | INTRAMUSCULAR | Status: DC | PRN

## 2022-11-01 MED ORDER — ALBUMIN HUMAN 25 % IV SOLN
INTRAVENOUS | Status: AC
  Administered 2022-11-01: 25 g
  Filled 2022-11-01: qty 100

## 2022-11-01 MED ORDER — PENTAFLUOROPROP-TETRAFLUOROETH EX AERO: 1.0000 | INHALATION_SPRAY | CUTANEOUS | Status: DC | PRN

## 2022-11-01 MED ORDER — HEPARIN SODIUM (PORCINE) 1000 UNIT/ML DIALYSIS
1000.0000 [IU] | INTRAMUSCULAR | Status: DC | PRN
  Administered 2022-11-01: 3800 [IU] via INTRAVENOUS_CENTRAL
  Filled 2022-11-01 (×2): qty 1

## 2022-11-01 MED ORDER — HEPARIN SODIUM (PORCINE) 1000 UNIT/ML IJ SOLN
INTRAMUSCULAR | Status: AC
  Filled 2022-11-01: qty 4

## 2022-11-01 NOTE — Progress Notes (Signed)
Received patient in HD recliner chair.Awake,alert and oriented x 4. Consent verified.  Access used : Left hd catheter that worked well.  Medicines given: Heparin 3,000 units pre-run dose.                             Midodrine 20 mg extra dose.                             Albumin 25 mg.  Duration of treatment 3.30 hours.  Fluid removed : 1850 cc.  Hemo comment: Last 30 minutes of his treatment,his blood pressures dropped and since then its  low blood pressures till completion of his treatment thus 100 cc NS bolus given..Not tolerating treatment when set to 2.5 liters UF goal.  Hand off to the patient's nurse.

## 2022-11-01 NOTE — Progress Notes (Signed)
[name] Progress Note   Subjective:   BP soft today but patient reports he feels well. Denies SOB, CP, dizziness, nausea. Did have nausea last night when he saw the pureed meat with dinner. Feels his weakness is improving.   Objective Vitals:   11/01/22 0528 11/01/22 0643 11/01/22 0740 11/01/22 0823  BP:  (!) 86/60  (!) 86/67  Pulse:  96    Resp:  18    Temp:  (!) 97.5 F (36.4 C)  98 F (36.7 C)  TempSrc:    Axillary  SpO2: 93% 97% 93% 95%  Weight:      Height:       Physical Exam General: Alert male in NAD Heart: RRR, no murmurs, rubs or gallops Lungs: CTA bilaterally, respirations unlabored on RA Abdomen: Soft, non-distended, +BS Extremities: No edema b/l lower extremities Dialysis Access: [facility] with intact bandage  Additional Objective Labs: Basic Metabolic Panel: Recent Labs  Lab 10/29/22 2316 10/31/22 0522 11/01/22 0348  NA 126* 126* 126*  K 3.6 3.5 3.5  CL 87* 86* 87*  CO2 21* 22 18*  GLUCOSE 120* 116* 97  BUN 45* 35* 54*  CREATININE 4.81* 4.36* 5.70*  CALCIUM 9.0 9.2 9.0  PHOS 5.1* 4.6 6.4*   Liver Function Tests: Recent Labs  Lab 10/29/22 2316 10/31/22 0522 11/01/22 0348  ALBUMIN 3.0* 3.1* 2.8*   No results for input(s): "LIPASE", "AMYLASE" in the last 168 hours. CBC: Recent Labs  Lab 10/27/22 0409 10/27/22 1158 10/29/22 0148  WBC 11.7* 11.3* 9.9  NEUTROABS  --   --  7.1  HGB 9.5* 9.3* 9.4*  HCT 30.3* 30.3* 31.2*  MCV 79.5* 80.4 83.9  PLT 283 285 319   Blood Culture    Component Value Date/Time   SDES BLOOD BLOOD RIGHT ARM 10/05/2022 2032   SPECREQUEST  10/05/2022 2032    BOTTLES DRAWN AEROBIC AND ANAEROBIC Blood Culture adequate volume   CULT  10/05/2022 2032    NO GROWTH 5 DAYS Performed at [facility], [address], [place], [place] [address]    REPTSTATUS 10/10/2022 FINAL 10/05/2022 2032    Cardiac Enzymes: No results for input(s): "CKTOTAL", "CKMB", "CKMBINDEX", "TROPONINI" in the last 168  hours. CBG: No results for input(s): "GLUCAP" in the last 168 hours. Iron Studies: No results for input(s): "IRON", "TIBC", "TRANSFERRIN", "FERRITIN" in the last 72 hours. @lablastinr3 @ Studies/Results: DG Swallowing Func-Speech Pathology  Result Date: 10/31/2022 Table formatting from the original result was not included. Modified Barium Swallow Study Patient Details Name: Ryan [name] MRN: [id number] Date of Birth: 1960/08/23 Today's Date: 10/31/2022 HPI/PMH: HPI: Ryan [name] is a 62 yo male admitted from CIR 7/18 for new onset A-fib with RVR while undergoing HD. Initially presented 6/6 with lower back pain and found to have evidence of L5/S1 discitis and osteomyelitis as well as MRSA bacteremia with mitral valve endocarditis. MRI Brain 6/27 with scattered subcentimeter acute ischemic infarcts involving bilateral cerebral hemispheres and R cerebellum. Per H&P note, while in rehab medicine team consulted 7/11 for cough and dyspnea with eventual diagnosis with concerns for PNA/aspiration. MBS 7/18 with mild oral residue with good sensation to clear and an otherwise safe and efficient oropharyngeal swallow. A regular texture diet with thin liquids was recommended at that time, however, pt requested Dys 3 textures. PMH includes anemia, ESRD, HLD, HTN Clinical Impression: Clinical Impression: Pt presents with a functional oropharyngeal swallow. With larger boluses, pt appears to swallow majority of bolus effectively, although with some  residue in his oral cavity that he promptly initiates lingual motion to clear. Despite pt's sensation of oral residue and initiation of lingual motion to propel bolus posteriorly, it then pools in the valleculae with no apparent sensation and pt required cueing to initiate subsequent swallow. However, this is effective in completely clearing pharyngeal residue. Pt thoroughly masticated solids with prompt swallow initiation and no oropharyngeal residue noted. One throat  clear observed during study, which was not related to inadequate airway protection. No penetration/aspiration noted with any trials of thin liquids, nectar thick liquids, purees, or solids. When taking the pill, pt required multiple sips of thin liquids to clear from his oral cavity before gagging as it progressed through his pharynx. A subsequent spoonful of puree was effective in clearing pill from the pharynx, although with retention noted in the esophagus that required another spoonful of puree to clear. Due to pt's reports of globus sensation and gagging with meal trays, suspect esophageal component, although note esophagram with no acute concerns. Recommend pt initiate diet of regular textures with thin liquids, although pt states his preference is to continue diet of Dys 1 textures due to subjective reports that it "goes down easier". SLP provided education regarding use of multiple swallows during meals, with which he verbalized understanding. Pt able to upgrade diet as he feels comfortable with consuming more solid textures. No further SLP f/u is necessary as pt presents with functional swallow. Will s/o at this time. Factors that may increase risk of adverse event in presence of aspiration Ryan [name] & Ryan [name] 2021): Factors that may increase risk of adverse event in presence of aspiration Ryan [name] & Ryan [name] 2021): Respiratory or GI disease Recommendations/Plan: Swallowing Evaluation Recommendations Swallowing Evaluation Recommendations Recommendations: PO diet PO Diet Recommendation: Dysphagia 1 (Pureed); Thin liquids (Level 0) Liquid Administration via: Cup; Straw Medication Administration: Whole meds with liquid (with purees PRN for larger pills) Supervision: Patient able to self-feed Swallowing strategies  : Minimize environmental distractions; Small bites/sips; Slow rate Postural changes: Stay upright 30-60 min after meals; Position pt fully upright for meals Oral care recommendations: Oral care BID  (2x/day); Pt independent with oral care Treatment Plan Treatment Plan Treatment recommendations: No treatment recommended at this time Follow-up recommendations: Acute inpatient rehab (3 hours/day) (for cognitive goals) Recommendations Comment: no dysphagia intervention required, conitnue w/ current POC for cognition Functional status assessment: Patient has not had a recent decline in their functional status. Recommendations Recommendations for follow up therapy are one component of a multi-disciplinary discharge planning process, led by the attending physician.  Recommendations may be updated based on patient status, additional functional criteria and insurance authorization. Assessment: Orofacial Exam: Orofacial Exam Oral Cavity: Oral Hygiene: WFL Oral Cavity - Dentition: Adequate natural dentition Orofacial Anatomy: WFL Oral Motor/Sensory Function: WFL Anatomy: Anatomy: WFL Boluses Administered: Boluses Administered Boluses Administered: Thin liquids (Level 0); Mildly thick liquids (Level 2, nectar thick); Moderately thick liquids (Level 3, honey thick); Puree; Solid  Oral Impairment Domain: Oral Impairment Domain Lip Closure: No labial escape Tongue control during bolus hold: Cohesive bolus between tongue to palatal seal Bolus preparation/mastication: Timely and efficient chewing and mashing Bolus transport/lingual motion: Brisk tongue motion Oral residue: Complete oral Ryan Location of oral residue : N/A Initiation of pharyngeal swallow : Posterior laryngeal surface of the epiglottis  Pharyngeal Impairment Domain: Pharyngeal Impairment Domain Soft palate elevation: No bolus between soft palate (SP)/pharyngeal wall (PW) Laryngeal elevation: Complete superior movement of thyroid cartilage with complete approximation of arytenoids to epiglottic petiole Anterior hyoid excursion:  Complete anterior movement Epiglottic movement: Complete inversion Laryngeal vestibule closure: Complete, no air/contrast in  laryngeal vestibule Pharyngeal stripping wave : Present - complete Pharyngeal contraction (A/P view only): N/A Pharyngoesophageal segment opening: Complete distension and complete duration, no obstruction of flow Tongue base retraction: Narrow column of contrast or air between tongue base and PPW Pharyngeal residue: Collection of residue within or on pharyngeal structures Location of pharyngeal residue: Valleculae; Pyriform sinuses  Esophageal Impairment Domain: Esophageal Impairment Domain Esophageal Ryan upright position: Esophageal retention Pill: Pill Consistency administered: Thin liquids (Level 0) Thin liquids (Level 0): Impaired (see clinical impressions) Penetration/Aspiration Scale Score: Penetration/Aspiration Scale Score 1.  Material does not enter airway: Thin liquids (Level 0); Mildly thick liquids (Level 2, nectar thick); Moderately thick liquids (Level 3, honey thick); Puree; Solid; Pill Compensatory Strategies: Compensatory Strategies Compensatory strategies: Yes Straw: Effective Effective Straw: Thin liquid (Level 0)   General Information: Caregiver present: No  Diet Prior to this Study: Dysphagia 1 (pureed); Thin liquids (Level 0)   Temperature : Normal   Respiratory Status: WFL   Supplemental O2: None (Room air)   History of Recent Intubation: No  Behavior/Cognition: Alert; Cooperative; Pleasant mood Self-Feeding Abilities: Able to self-feed Baseline vocal quality/speech: Normal Volitional Cough: Able to elicit Volitional Swallow: Able to elicit Exam Limitations: No limitations Goal Planning: Prognosis for improved oropharyngeal function: Good No data recorded No data recorded Patient/Family Stated Goal: none stated Consulted and agree with results and recommendations: Patient Pain: Pain Assessment Pain Assessment: No/denies pain End of Session: Start Time:SLP Start Time (ACUTE ONLY): 0858 Stop Time: SLP Stop Time (ACUTE ONLY): 0920 Time Calculation:SLP Time Calculation (min) (ACUTE ONLY): 22  min Charges: SLP Evaluations $ SLP Speech Visit: 1 Visit SLP Evaluations $BSS Swallow: 1 Procedure $MBS Swallow: 1 Procedure SLP visit diagnosis: SLP Visit Diagnosis: Dysphagia, unspecified (R13.10) Past Medical History: Past Medical History: Diagnosis Date  Allergy   Anemia   Blood transfusion without reported diagnosis   Dialysis patient (HCC)   Tues,thurs,sat  ESRD (end stage renal disease) (HCC)   TTHS-   Family history of adverse reaction to anesthesia   father had allergic reaction with anesthesia with a dental procedure-(pt. doesn't know)  Hyperlipidemia   Hypertension  Past Surgical History: Past Surgical History: Procedure Laterality Date  A/V FISTULAGRAM Left 12/15/2016  Procedure: A/V Fistulagram - left;  Surgeon: Chuck Hint, MD;  Location: [facility] INVASIVE CV LAB;  Service: Cardiovascular;  Laterality: Left;  AV FISTULA PLACEMENT Left 08/28/2016  Procedure: LEFT UPPER  ARM ARTERIOVENOUS (AV) FISTULA CREATION;  Surgeon: Chuck Hint, MD;  Location: [facility] OR;  Service: Vascular;  Laterality: Left;  DIALYSIS/PERMA CATHETER INSERTION Right 12/21/2017  Procedure: INSERTION OF DIALYSIS CATHETER Right Internal Jugular .;  Surgeon: Sherren Kerns, MD;  Location: [facility] OR;  Service: Vascular;  Laterality: Right;  FISTULA SUPERFICIALIZATION Left 09/23/2017  Procedure: FISTULA PLICATION LEFT ARM;  Surgeon: Sherren Kerns, MD;  Location: [facility] OR;  Service: Vascular;  Laterality: Left;  FISTULOGRAM Left 12/21/2017  Procedure: FISTULOGRAM with Balloon Angioplasty.;  Surgeon: Sherren Kerns, MD;  Location: [facility] OR;  Service: Vascular;  Laterality: Left;  HEMATOMA EVACUATION Left 08/27/2020  Procedure: EVACUATION HEMATOMA LEFT ARM;  Surgeon: Sherren Kerns, MD;  Location: [facility] OR;  Service: Vascular;  Laterality: Left;  IR FLUORO GUIDE CV LINE LEFT  09/22/2022  IR FLUORO GUIDE CV LINE RIGHT  09/12/2022  IR FLUORO GUIDE CV LINE RIGHT  09/17/2022  IR REMOVAL TUN CV CATH W/O FL  09/09/2022  IR THORACENTESIS ASP  PLEURAL SPACE W/IMG GUIDE  08/22/2017  1.2 L -right-sided  IR THORACENTESIS ASP PLEURAL SPACE W/IMG GUIDE  10/19/2017  IR US GUIDE VASC ACCESS LEFT  09/22/2022  IR US GUIDE VASC ACCESS RIGHT  09/12/2022  KIDNEY TRANSPLANT    REVISON OF ARTERIOVENOUS FISTULA Left 12/21/2017  Procedure: PLICATION and Ligation of F LEFT ARM ARTERIOVENOUS FISTULA;  Surgeon: Sherren Kerns, MD;  Location: [facility] OR;  Service: Vascular;  Laterality: Left;  REVISON OF ARTERIOVENOUS FISTULA Left 07/16/2020  Procedure: EXCISION OF LEFT ARM ARTERIOVENOUS FISTULA;  Surgeon: Sherren Kerns, MD;  Location: [facility] OR;  Service: Vascular;  Laterality: Left;  stent in kidneys    dec 2017  TRANSTHORACIC ECHOCARDIOGRAM  09/22/2017   Severe LVH.  Normal function -EF 55-60%.  GRII DD.  Moderate RV dilation with mildly reduced RV function.   Fixed right coronary cusp with very mild aortic stenosis.  The myocardium has a speckled appearance --> .   Recommend cardiac MRI to evaluate for amyloid  UPPER EXTREMITY VENOGRAPHY Bilateral 06/01/2020  Procedure: UPPER EXTREMITY VENOGRAPHY;  Surgeon: Chuck Hint, MD;  Location: [facility] INVASIVE CV LAB;  Service: Cardiovascular;  Laterality: Bilateral; Gwynneth Aliment, M.A., CF-SLP Speech Language Pathology, Acute Rehabilitation Services Secure Chat preferred [phone] 10/31/2022, 10:09 AM  Medications:  sodium chloride 10 mL/hr at 10/18/22 2141    ceFAZolin (ANCEF) IV 2 g (10/30/22 1754)   lactated ringers      (feeding supplement) PROSource Plus  30 mL Oral TID WC   arformoterol  15 mcg Nebulization BID   benzonatate  200 mg Oral TID   Chlorhexidine Gluconate Cloth  6 each Topical Q0600   Darbepoetin Alfa  150 mcg Subcutaneous Q Thu-1800   feeding supplement (NEPRO CARB STEADY)  237 mL Oral TID BM   heparin  5,000 Units Subcutaneous Q8H   midodrine  20 mg Oral TID WC   multivitamin  1 tablet Oral QHS   pantoprazole  40 mg Oral Daily   revefenacin  175 mcg Nebulization Daily    Dialysis  Orders: TTS at [facility] 3:45hr, 450/A1.5, EDW 85.7kg, 2K/2Ca bath, TDC, heparin 3000 unit bolus - no ESA - Calcitriol 1.24mcg PO q HD  Assessment/Plan: MSSA bacteremia/endocarditis/L5-S1 discitis: Blood Cx 6/16 MSSA, negative Cx on 6/17, 6/19. S/p TDC line holiday. TTE was concerning for endocarditis. TEE cancelled d/t high risk factors. LS MRI with possible L5-S1 discitis. On IV Cefazolin 2g q HD x 6 weeks (until 11/04/22). 2. Hyponatremia: Likely 2nd volume overload. Na level improving, now 126. Monitor trend. 3. BP/Volume overload: Hypotension preventing adequate UF. Required ICU transfer for CRRT 7/21 - 10/15/22. Reinforced fluid restrictions. Metoprolol stopped and Midodrine raised to 20mg  TID-okay to give an extra 20mg  mid-run during HD if needed. Current EDW around 70.7kg. 4. Deconditioning [name]: was in CIR, was transferred back to 6E due to  medical complications. Hoping for transfer back to CIR soon 5. ESRD: See above, continue HD on TTS schedule 6. Access: TDC pulled 6/18 for line holiday, then new LIJ TDC placed 7/01 per IR. For perm access: s/p L AVF ligation in 2019. Last seen by Dr. Edilia Bo 01/2022 - limited mobility in R arm so planning for L arm graft but had not scheduled surgery yet. Can revisit as an outpatient once bacteremia resolved.  7. Anemia (ESRD + ABLA, + FOBT): Hgb 9.4. Continue Aranesp q Thursday.   8. CKD-MBD: CorrCa elevated, hold VDRA. Phos fluctuating, not on binders for now but will need  to resume if phos stays elevated 9. Nutrition - On dysphagia diet and protein supplements     Rogers Blocker, PA-C 11/01/2022, 9:24 AM  [facility] Pager: [phone]

## 2022-11-01 NOTE — Progress Notes (Signed)
CCMD called and stated pt had a 9 beat run of v-tach at 1111. Pt asymptomatic. MD paged and made aware. No new orders at this time.

## 2022-11-01 NOTE — Progress Notes (Addendum)
Progress Note   Patient: Ryan [name] DOB: Dec 23, 1960 DOA: 10/09/2022     23 DOS: the patient was seen and examined on 11/01/2022   Brief hospital course: Ryan [name] was admitted to the hospital with the working diagnosis of acute hypoxemic respiratory failure due to pneumonia in the setting of endocarditis with septic emboli.   62 yo male with the past medical history of hypertension, ESRD, and hyperlipidemia who was transferred from CIR to inpatient setting due to suspected new onset atrial fibrillation with RVR. Recent hospitalization 06/16 to 07/04 for mitral valve endocarditis, MSSA bacteremia with brain septic emboli.  One week after his discharge while in CIR he was diagnosed with pneumonia, and worsening anemia. On 07/18 he developed suspected atrial fibrillation prompting his transfer back to inpatient setting.  On his initial physical examination his blood pressure was 103/72, HR 101, RR 20 and temp 97.7, lungs with bibasilar rhonchi, heart with S1 and S2 present and irregular, abdomen with no distention and no lower extremity edema.   Na 124, K 3,9 Cl 88, bicarbonate 24, glucose 115 bun 40 cr 7,42 Wbc 14.3 hgb 7.7 plt 377  TSH 8,9   Chest radiograph with cardiomegaly, bilateral interstitial infiltrates symmetric and central, bilateral hilar vascular congestion. HD catheter in the left internal jugular vein with tip in the SVC.   EKG 135 bpm, normal axis, normal intervals, sinus rhythm with 1st degree AV block and PAC, no significant ST segment or T wave changes.   7/18 - Readmitted to [facility] for Afib RVR/SOB requiring BiPAP, inability to complete HD. 7/22 - Off of BiPAP, tolerating Salter. Remains anxious. Tolerating CRRT. 7/24 patient on sinus rhythm and off CRRT.  07/25 transferred to [facility].   08/04 patient with improvement in volume status, tolerating renal replacement therapy with ultrafiltration.  Has been placed on midodrine for blood pressure support.   Pending transfer back to CIR to continue physical therapy.  08/08 continue inpatient renal replacement therapy.   Assessment and Plan: * Multifocal atrial tachycardia Features of MAT, now on sinus rhythm.   Ruled out atrial fibrillation, (note from cardiology consultation from 07.18 concluded no atrial fibrillation).   08/04 EKG 106 bpm, normal axis, normal intervals, sinus rhythm with PAC, no significant ST segment or T wave changes.   Patient has been on diltiazem and then on metoprolol for rate control. Now due to hypotension, AV blockade has been discontinued.  Sinus rhythm on telemetry.   CAP (community acquired pneumonia) Acute hypoxemic respiratory failure.   Oxygenation has improved. Plan to continue antibiotic therapy until 08/13 with cefazolin.  Continue bronchodilator therapy. His 02 saturation is 95% on room air today.  ESRD on dialysis (HCC) Hyponatremia.   Renal function with serum Na 126, K is 3,5 and serum bicarbonate at 18, Mg 1.9   Continue blood pressure support with midodrine (20 mg tid) for renal replacement therapy.  Repeat dose of IV mag 2 g, patient had ectopy on telemetry.  Anemia of chronic renal disease.  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 Plan to continue with IV cefazolin.   L4 and S1 discitis and osteomyelitis, will need antibiotic therapy with IV cephalosporin until 08/13.    Hyperlipidemia Continue statin therapy.   Hypertension Hypotension.  Midodrine 20 mg tid.  Metoprolol has been discontinued.   Echocardiogram with preserved LV systolic function EF 55 to 60%, moderate LVH,  RV systolic function preserved, LA with mild dilatation, large rounded structure on the anterior leflet of the mitral valve, partially obstructing mitral inflow with ball-valve type motion. No mitral stenosis or mitral regurgitation. Mild aortic  valve stenosis.    Moderate protein malnutrition (HCC) Continue nutritional supplementation,.         Subjective: patient is feeling well, no chest pain or dyspnea,   Physical Exam: Vitals:   11/01/22 0528 11/01/22 0643 11/01/22 0740 11/01/22 0823  BP:  (!) 86/60  (!) 86/67  Pulse:  96    Resp:  18    Temp:  (!) 97.5 F (36.4 C)  98 F (36.7 C)  TempSrc:    Axillary  SpO2: 93% 97% 93% 95%  Weight:      Height:       Neurology awake and alert ENT With mild pallor Cardiovascular with S1 and S2 present with positive systolic murmur at the apex No JVD No lower extremity edema Respiratory with scattered rhonchi with no rales or wheezing Abdomen with no distention   Data Reviewed:    Family Communication: no family at the bediside  Disposition: Status is: Inpatient Remains inpatient appropriate because: pending transfer to CIR   Planned Discharge Destination: Rehab      Author: Coralie Keens, MD 11/01/2022 11:12 AM  For on call review [email or web address].

## 2022-11-01 NOTE — Progress Notes (Addendum)
TRH night cross cover note:   I was notified by RN that the patient's blood pressure is running slightly lower this morning, most recently 81/58, which is relative to his systolic blood pressures in the 90s over at least the last day.  He is currently asymptomatic, and heart rates noted to be in the low 100s.  Maintaining oxygen saturations in the mid 90s on room air.  I confirmed with the patient's RN that the patient is going for hemodialysis today.  In light of this information, I have ordered a small IV fluid bolus in the form of lactated Ringer's at 250 cc/h to be administered over 1 hour.   Update: pt refused the IVF's. He is due for scheduled midodrine at 8 AM this morning. Will give the 8 AM dose of midodrine now.     Newton Pigg, DO Hospitalist

## 2022-11-01 NOTE — Plan of Care (Signed)
°  Problem: Nutrition: °Goal: Adequate nutrition will be maintained °Outcome: Progressing °  °Problem: Coping: °Goal: Level of anxiety will decrease °Outcome: Progressing °  °Problem: Safety: °Goal: Ability to remain free from injury will improve °Outcome: Progressing °  °

## 2022-11-02 LAB — RENAL FUNCTION PANEL
Albumin: 3.1 g/dL — ABNORMAL LOW (ref 3.5–5.0)
Anion gap: 17 — ABNORMAL HIGH (ref 5–15)
BUN: 28 mg/dL — ABNORMAL HIGH (ref 8–23)
CO2: 19 mmol/L — ABNORMAL LOW (ref 22–32)
Calcium: 8.7 mg/dL — ABNORMAL LOW (ref 8.9–10.3)
Chloride: 90 mmol/L — ABNORMAL LOW (ref 98–111)
Creatinine, Ser: 4.04 mg/dL — ABNORMAL HIGH (ref 0.61–1.24)
GFR, Estimated: 16 mL/min — ABNORMAL LOW (ref >60)
Glucose, Bld: 107 mg/dL — ABNORMAL HIGH (ref 70–99)
Phosphorus: 4.7 mg/dL — ABNORMAL HIGH (ref 2.5–4.6)
Potassium: 3.3 mmol/L — ABNORMAL LOW (ref 3.5–5.1)
Sodium: 126 mmol/L — ABNORMAL LOW (ref 135–145)

## 2022-11-02 LAB — MAGNESIUM: Magnesium: 2.4 mg/dL (ref 1.7–2.4)

## 2022-11-02 MED ORDER — MIDODRINE HCL 5 MG PO TABS
10.0000 mg | ORAL_TABLET | Freq: Once | ORAL | Status: AC
  Administered 2022-11-02: 10 mg via ORAL
  Filled 2022-11-02: qty 2

## 2022-11-02 NOTE — Progress Notes (Signed)
[facility] Progress Note   Subjective:   Pt with hypotension towards the end of HD yesterday, BP now low/stable in 80's systolic. He denies SOB, CP, dizziness, nausea. Has a cough but he feels it is improving.   Objective Vitals:   11/01/22 2042 11/01/22 2339 11/02/22 0357 11/02/22 0756  BP:  (!) 82/64 (!) 86/61 (!) 81/61  Pulse: 86 93 (!) 101 (!) 102  Resp: (!) 21 (!) 22 18 15   Temp:  (!) 97.4 F (36.3 C) (!) 97.5 F (36.4 C) (!) 97.5 F (36.4 C)  TempSrc:  Axillary Oral Oral  SpO2: 98% 95% 98% 99%  Weight:   71.7 kg   Height:       Physical Exam General: Alert male in NAD Heart: RRR, no murmurs, rubs or gallops Lungs: CTA bilaterally, on RA Abdomen: Soft, non-distended, +BS Extremities: No edema b/l lower extremities Dialysis Access: [place]  Additional Objective Labs: Basic Metabolic Panel: Recent Labs  Lab 10/31/22 0522 11/01/22 0348 11/02/22 0202  NA 126* 126* 126*  K 3.5 3.5 3.3*  CL 86* 87* 90*  CO2 22 18* 19*  GLUCOSE 116* 97 107*  BUN 35* 54* 28*  CREATININE 4.36* 5.70* 4.04*  CALCIUM 9.2 9.0 8.7*  PHOS 4.6 6.4* 4.7*   Liver Function Tests: Recent Labs  Lab 10/31/22 0522 11/01/22 0348 11/02/22 0202  ALBUMIN 3.1* 2.8* 3.1*   No results for input(s): "LIPASE", "AMYLASE" in the last 168 hours. CBC: Recent Labs  Lab 10/27/22 0409 10/27/22 1158 10/29/22 0148  WBC 11.7* 11.3* 9.9  NEUTROABS  --   --  7.1  HGB 9.5* 9.3* 9.4*  HCT 30.3* 30.3* 31.2*  MCV 79.5* 80.4 83.9  PLT 283 285 319   Blood Culture    Component Value Date/Time   SDES BLOOD BLOOD RIGHT ARM 10/05/2022 2032   SPECREQUEST  10/05/2022 2032    BOTTLES DRAWN AEROBIC AND ANAEROBIC Blood Culture adequate volume   CULT  10/05/2022 2032    NO GROWTH 5 DAYS Performed at [facility], [address], [place], [place] [address]    REPTSTATUS 10/10/2022 FINAL 10/05/2022 2032    Cardiac Enzymes: No results for input(s): "CKTOTAL", "CKMB", "CKMBINDEX",  "TROPONINI" in the last 168 hours. CBG: No results for input(s): "GLUCAP" in the last 168 hours. Iron Studies: No results for input(s): "IRON", "TIBC", "TRANSFERRIN", "FERRITIN" in the last 72 hours. @lablastinr3 @ Studies/Results: DG Swallowing Func-Speech Pathology  Result Date: 10/31/2022 Table formatting from the original result was not included. Modified Barium Swallow Study Patient Details Name: Ryan [name] MRN: [id number] Date of Birth: 31-Mar-1960 Today's Date: 10/31/2022 HPI/PMH: HPI: Ryan [name] is a 62 yo male admitted from CIR 7/18 for new onset A-fib with RVR while undergoing HD. Initially presented 6/6 with lower back pain and found to have evidence of L5/S1 discitis and osteomyelitis as well as MRSA bacteremia with mitral valve endocarditis. MRI Brain 6/27 with scattered subcentimeter acute ischemic infarcts involving bilateral cerebral hemispheres and R cerebellum. Per H&P note, while in rehab medicine team consulted 7/11 for cough and dyspnea with eventual diagnosis with concerns for PNA/aspiration. MBS 7/18 with mild oral residue with good sensation to clear and an otherwise safe and efficient oropharyngeal swallow. A regular texture diet with thin liquids was recommended at that time, however, pt requested Dys 3 textures. PMH includes anemia, ESRD, HLD, HTN Clinical Impression: Clinical Impression: Pt presents with a functional oropharyngeal swallow. With larger boluses, pt appears to swallow majority of bolus  effectively, although with some residue in his oral cavity that he promptly initiates lingual motion to clear. Despite pt's sensation of oral residue and initiation of lingual motion to propel bolus posteriorly, it then pools in the valleculae with no apparent sensation and pt required cueing to initiate subsequent swallow. However, this is effective in completely clearing pharyngeal residue. Pt thoroughly masticated solids with prompt swallow initiation and no oropharyngeal  residue noted. One throat clear observed during study, which was not related to inadequate airway protection. No penetration/aspiration noted with any trials of thin liquids, nectar thick liquids, purees, or solids. When taking the pill, pt required multiple sips of thin liquids to clear from his oral cavity before gagging as it progressed through his pharynx. A subsequent spoonful of puree was effective in clearing pill from the pharynx, although with retention noted in the esophagus that required another spoonful of puree to clear. Due to pt's reports of globus sensation and gagging with meal trays, suspect esophageal component, although note esophagram with no acute concerns. Recommend pt initiate diet of regular textures with thin liquids, although pt states his preference is to continue diet of Dys 1 textures due to subjective reports that it "goes down easier". SLP provided education regarding use of multiple swallows during meals, with which he verbalized understanding. Pt able to upgrade diet as he feels comfortable with consuming more solid textures. No further SLP f/u is necessary as pt presents with functional swallow. Will s/o at this time. Factors that may increase risk of adverse event in presence of aspiration Ryan [name] & Ryan [name] 2021): Factors that may increase risk of adverse event in presence of aspiration Ryan [name] & Ryan [name] 2021): Respiratory or GI disease Recommendations/Plan: Swallowing Evaluation Recommendations Swallowing Evaluation Recommendations Recommendations: PO diet PO Diet Recommendation: Dysphagia 1 (Pureed); Thin liquids (Level 0) Liquid Administration via: Cup; Straw Medication Administration: Whole meds with liquid (with purees PRN for larger pills) Supervision: Patient able to self-feed Swallowing strategies  : Minimize environmental distractions; Small bites/sips; Slow rate Postural changes: Stay upright 30-60 min after meals; Position pt fully upright for meals Oral care  recommendations: Oral care BID (2x/day); Pt independent with oral care Treatment Plan Treatment Plan Treatment recommendations: No treatment recommended at this time Follow-up recommendations: Acute inpatient rehab (3 hours/day) (for cognitive goals) Recommendations Comment: no dysphagia intervention required, conitnue w/ current POC for cognition Functional status assessment: Patient has not had a recent decline in their functional status. Recommendations Recommendations for follow up therapy are one component of a multi-disciplinary discharge planning process, led by the attending physician.  Recommendations may be updated based on patient status, additional functional criteria and insurance authorization. Assessment: Orofacial Exam: Orofacial Exam Oral Cavity: Oral Hygiene: WFL Oral Cavity - Dentition: Adequate natural dentition Orofacial Anatomy: WFL Oral Motor/Sensory Function: WFL Anatomy: Anatomy: WFL Boluses Administered: Boluses Administered Boluses Administered: Thin liquids (Level 0); Mildly thick liquids (Level 2, nectar thick); Moderately thick liquids (Level 3, honey thick); Puree; Solid  Oral Impairment Domain: Oral Impairment Domain Lip Closure: No labial escape Tongue control during bolus hold: Cohesive bolus between tongue to palatal seal Bolus preparation/mastication: Timely and efficient chewing and mashing Bolus transport/lingual motion: Brisk tongue motion Oral residue: Complete oral Ryan Location of oral residue : N/A Initiation of pharyngeal swallow : Posterior laryngeal surface of the epiglottis  Pharyngeal Impairment Domain: Pharyngeal Impairment Domain Soft palate elevation: No bolus between soft palate (SP)/pharyngeal wall (PW) Laryngeal elevation: Complete superior movement of thyroid cartilage with complete approximation of arytenoids to epiglottic  petiole Anterior hyoid excursion: Complete anterior movement Epiglottic movement: Complete inversion Laryngeal vestibule closure:  Complete, no air/contrast in laryngeal vestibule Pharyngeal stripping wave : Present - complete Pharyngeal contraction (A/P view only): N/A Pharyngoesophageal segment opening: Complete distension and complete duration, no obstruction of flow Tongue base retraction: Narrow column of contrast or air between tongue base and PPW Pharyngeal residue: Collection of residue within or on pharyngeal structures Location of pharyngeal residue: Valleculae; Pyriform sinuses  Esophageal Impairment Domain: Esophageal Impairment Domain Esophageal Ryan upright position: Esophageal retention Pill: Pill Consistency administered: Thin liquids (Level 0) Thin liquids (Level 0): Impaired (see clinical impressions) Penetration/Aspiration Scale Score: Penetration/Aspiration Scale Score 1.  Material does not enter airway: Thin liquids (Level 0); Mildly thick liquids (Level 2, nectar thick); Moderately thick liquids (Level 3, honey thick); Puree; Solid; Pill Compensatory Strategies: Compensatory Strategies Compensatory strategies: Yes Straw: Effective Effective Straw: Thin liquid (Level 0)   General Information: Caregiver present: No  Diet Prior to this Study: Dysphagia 1 (pureed); Thin liquids (Level 0)   Temperature : Normal   Respiratory Status: WFL   Supplemental O2: None (Room air)   History of Recent Intubation: No  Behavior/Cognition: Alert; Cooperative; Pleasant mood Self-Feeding Abilities: Able to self-feed Baseline vocal quality/speech: Normal Volitional Cough: Able to elicit Volitional Swallow: Able to elicit Exam Limitations: No limitations Goal Planning: Prognosis for improved oropharyngeal function: Good No data recorded No data recorded Patient/Family Stated Goal: none stated Consulted and agree with results and recommendations: Patient Pain: Pain Assessment Pain Assessment: No/denies pain End of Session: Start Time:SLP Start Time (ACUTE ONLY): 0858 Stop Time: SLP Stop Time (ACUTE ONLY): 0920 Time Calculation:SLP Time  Calculation (min) (ACUTE ONLY): 22 min Charges: SLP Evaluations $ SLP Speech Visit: 1 Visit SLP Evaluations $BSS Swallow: 1 Procedure $MBS Swallow: 1 Procedure SLP visit diagnosis: SLP Visit Diagnosis: Dysphagia, unspecified (R13.10) Past Medical History: Past Medical History: Diagnosis Date  Allergy   Anemia   Blood transfusion without reported diagnosis   Dialysis patient (HCC)   Tues,thurs,sat  ESRD (end stage renal disease) (HCC)   TTHS-   Family history of adverse reaction to anesthesia   father had allergic reaction with anesthesia with a dental procedure-(pt. doesn't know)  Hyperlipidemia   Hypertension  Past Surgical History: Past Surgical History: Procedure Laterality Date  A/V FISTULAGRAM Left 12/15/2016  Procedure: A/V Fistulagram - left;  Surgeon: Chuck Hint, MD;  [facility] INVASIVE CV LAB;  Service: Cardiovascular;  Laterality: Left;  AV FISTULA PLACEMENT Left 08/28/2016  Procedure: LEFT UPPER  ARM ARTERIOVENOUS (AV) FISTULA CREATION;  Surgeon: Chuck Hint, MD;  Location: [facility] OR;  Service: Vascular;  Laterality: Left;  DIALYSIS/PERMA CATHETER INSERTION Right 12/21/2017  Procedure: INSERTION OF DIALYSIS CATHETER Right Internal Jugular .;  Surgeon: Sherren Kerns, MD;  Location: [facility] OR;  Service: Vascular;  Laterality: Right;  FISTULA SUPERFICIALIZATION Left 09/23/2017  Procedure: FISTULA PLICATION LEFT ARM;  Surgeon: Sherren Kerns, MD;  Location: [facility] OR;  Service: Vascular;  Laterality: Left;  FISTULOGRAM Left 12/21/2017  Procedure: FISTULOGRAM with Balloon Angioplasty.;  Surgeon: Sherren Kerns, MD;  Location: [facility] OR;  Service: Vascular;  Laterality: Left;  HEMATOMA EVACUATION Left 08/27/2020  Procedure: EVACUATION HEMATOMA LEFT ARM;  Surgeon: Sherren Kerns, MD;  Location: [facility] OR;  Service: Vascular;  Laterality: Left;  IR FLUORO GUIDE CV LINE LEFT  09/22/2022  IR FLUORO GUIDE CV LINE RIGHT  09/12/2022  IR FLUORO GUIDE CV LINE RIGHT  09/17/2022  IR REMOVAL TUN CV CATH  W/O  FL  09/09/2022  IR THORACENTESIS ASP PLEURAL SPACE W/IMG GUIDE  08/22/2017  1.2 L -right-sided  IR THORACENTESIS ASP PLEURAL SPACE W/IMG GUIDE  10/19/2017  IR US GUIDE VASC ACCESS LEFT  09/22/2022  IR US GUIDE VASC ACCESS RIGHT  09/12/2022  KIDNEY TRANSPLANT    REVISON OF ARTERIOVENOUS FISTULA Left 12/21/2017  Procedure: PLICATION and Ligation of F LEFT ARM ARTERIOVENOUS FISTULA;  Surgeon: Sherren Kerns, MD;  Location: [facility] OR;  Service: Vascular;  Laterality: Left;  REVISON OF ARTERIOVENOUS FISTULA Left 07/16/2020  Procedure: EXCISION OF LEFT ARM ARTERIOVENOUS FISTULA;  Surgeon: Sherren Kerns, MD;  Location: [facility] OR;  Service: Vascular;  Laterality: Left;  stent in kidneys    dec 2017  TRANSTHORACIC ECHOCARDIOGRAM  09/22/2017   Severe LVH.  Normal function -EF 55-60%.  GRII DD.  Moderate RV dilation with mildly reduced RV function.   Fixed right coronary cusp with very mild aortic stenosis.  The myocardium has a speckled appearance --> .   Recommend cardiac MRI to evaluate for amyloid  UPPER EXTREMITY VENOGRAPHY Bilateral 06/01/2020  Procedure: UPPER EXTREMITY VENOGRAPHY;  Surgeon: Chuck Hint, MD;  Location: [facility] INVASIVE CV LAB;  Service: Cardiovascular;  Laterality: Bilateral; Gwynneth Aliment, M.A., CF-SLP Speech Language Pathology, Acute Rehabilitation Services Secure Chat preferred [phone] 10/31/2022, 10:09 AM  Medications:  sodium chloride 10 mL/hr at 10/18/22 2141    ceFAZolin (ANCEF) IV 2 g (11/01/22 2120)   lactated ringers      (feeding supplement) PROSource Plus  30 mL Oral TID WC   arformoterol  15 mcg Nebulization BID   benzonatate  200 mg Oral TID   Chlorhexidine Gluconate Cloth  6 each Topical Q0600   Darbepoetin Alfa  150 mcg Subcutaneous Q Thu-1800   feeding supplement (NEPRO CARB STEADY)  237 mL Oral TID BM   heparin  5,000 Units Subcutaneous Q8H   midodrine  20 mg Oral TID WC   multivitamin  1 tablet Oral QHS   pantoprazole  40 mg Oral Daily   revefenacin  175 mcg  Nebulization Daily    OP Dialysis Orders: TTS at [facility] 3:45hr, 450/A1.5, EDW 85.7kg, 2K/2Ca bath, TDC, heparin 3000 unit bolus - no ESA - Calcitriol 1.35mcg PO q HD  Assessment/Plan: MSSA bacteremia/endocarditis/L5-S1 discitis: Blood Cx 6/16 MSSA, negative Cx on 6/17, 6/19. S/p TDC line holiday. TTE was concerning for endocarditis. TEE cancelled d/t high risk factors. LS MRI with possible L5-S1 discitis. On IV Cefazolin 2g q HD x 6 weeks (until 11/04/22). 2. Hyponatremia: Likely 2nd volume overload. Na level improving, now 126.  3. BP/Volume overload: Hypotension preventing adequate UF. Required ICU transfer for CRRT 7/21 - 10/15/22. Reinforced fluid restrictions. Metoprolol stopped and Midodrine raised to 20mg  TID-okay to give an extra 20mg  mid-run during HD if needed. Current EDW around 71kg. 4. Deconditioning Ryan [name]: was in CIR, was transferred back to 6E due to  medical complications. Hoping for transfer back to CIR soon 5. ESRD: See above, continue HD on TTS schedule 6. Access: TDC pulled 6/18 for line holiday, then new LIJ TDC placed 7/01 per IR. For perm access: s/p L AVF ligation in 2019. Last seen by Dr. Edilia Bo 01/2022 - limited mobility in R arm so planning for L arm graft but had not scheduled surgery yet. Can revisit as an outpatient once bacteremia resolved.  7. Anemia (ESRD + ABLA, + FOBT): Hgb 9.4. Continue Aranesp q Thursday.   8. CKD-MBD: CorrCa elevated but improving, hold VDRA. Phos fluctuating, not  on binders for now 9. Nutrition - On dysphagia diet and protein supplements   Rogers Blocker, PA-C 11/02/2022, 8:27 AM  [facility] Pager: [phone]

## 2022-11-02 NOTE — Progress Notes (Signed)
Progress Note   Patient: Ryan [name] ZOX:[id number] DOB: [birth year] DOA: 10/09/2022     24 DOS: the patient was seen and examined on 11/02/2022   Brief [name] course: Ryan [name] was admitted to the [name] with the working diagnosis of acute hypoxemic respiratory failure due to pneumonia in the setting of endocarditis with septic emboli.   62 yo male with the past medical history of hypertension, ESRD, and hyperlipidemia who was transferred from CIR to inpatient setting due to suspected new onset atrial fibrillation with RVR. Recent hospitalization 06/16 to 07/04 for mitral valve endocarditis, MSSA bacteremia with brain septic emboli.  One week after his discharge while in CIR he was diagnosed with pneumonia, and worsening anemia. On 07/18 he developed suspected atrial fibrillation prompting his transfer back to inpatient setting.  On his initial physical examination his blood pressure was 103/72, HR 101, RR 20 and temp 97.7, lungs with bibasilar rhonchi, heart with S1 and S2 present and irregular, abdomen with no distention and no lower extremity edema.   Na 124, K 3,9 Cl 88, bicarbonate 24, glucose 115 bun 40 cr 7,42 Wbc 14.3 hgb 7.7 plt 377  TSH 8,9   Chest radiograph with cardiomegaly, bilateral interstitial infiltrates symmetric and central, bilateral hilar vascular congestion. HD catheter in the left internal jugular vein with tip in the SVC.   EKG 135 bpm, normal axis, normal intervals, sinus rhythm with 1st degree AV block and PAC, no significant ST segment or T wave changes.   7/18 - Readmitted to Ryan [name] for Afib RVR/SOB requiring BiPAP, inability to complete HD. 7/22 - Off of BiPAP, tolerating Salter. Remains anxious. Tolerating CRRT. 7/24 patient on sinus rhythm and off CRRT.  07/25 transferred to Ryan [name].   08/04 patient with improvement in volume status, tolerating renal replacement therapy with ultrafiltration.  Has been placed on midodrine for blood pressure support.   Pending transfer back to CIR to continue physical therapy.  08/08 continue inpatient renal replacement therapy.   Assessment and Plan: * Multifocal atrial tachycardia Features of MAT, now on sinus rhythm.   Ruled out atrial fibrillation, (note from cardiology consultation from 07.18 concluded no atrial fibrillation).   08/04 EKG 106 bpm, normal axis, normal intervals, sinus rhythm with PAC, no significant ST segment or T wave changes.   Patient has been on diltiazem and then on metoprolol for rate control. Now due to hypotension, AV blockade has been discontinued.  Sinus rhythm on telemetry.   CAP (community acquired pneumonia) Acute hypoxemic respiratory failure.   Oxygenation has improved. Plan to continue antibiotic therapy until 08/13 with cefazolin.  Continue bronchodilator therapy. His 02 saturation is 100% on room air today.  ESRD on dialysis (HCC) Hyponatremia.   Today Na is 126, K 3,3 and serum bicarbonate at 19. BUN 28 down from 54  Mg 2,4  Continue blood pressure support with midodrine (20 mg tid) for renal replacement therapy.   Anemia of chronic renal disease.  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 Plan to continue with IV cefazolin.   L4 and S1 discitis and osteomyelitis, will need antibiotic therapy with IV cephalosporin until 08/13.    Hyperlipidemia Continue statin therapy.   Hypertension Hypotension.  Midodrine 20 mg tid.  Metoprolol has been discontinued.   Echocardiogram with preserved LV systolic function EF 55 to 60%, moderate LVH, RV systolic function preserved, LA with mild dilatation, large rounded  structure on the anterior leflet of the mitral valve, partially obstructing mitral inflow with ball-valve type motion. No mitral stenosis or mitral regurgitation. Mild aortic valve stenosis.    Moderate protein malnutrition  (HCC) Continue nutritional supplementation,.         Subjective: Patient is out of bed in the chair, no chest pain or dyspnea, continue very weak and deconditioned, not at his baseline   Physical Exam: Vitals:   11/02/22 0756 11/02/22 0903 11/02/22 0906 11/02/22 1211  BP: (!) 81/61   (!) 82/62  Pulse: (!) 102 93  88  Resp: 15 17  20   Temp: (!) 97.5 F (36.4 C)   97.6 F (36.4 C)  TempSrc: Oral   Oral  SpO2: 99% 98% 98% 100%  Weight:      Height:       Neurology awake and alert ENT with mild pallor Cardiovascular with S1 and S2 present with no gallops, positive systolic murmur at the apex No JVD No lower extremity edema Respiratory with no rales or wheezing, no rhonchi Abdomen with no distention  Data Reviewed:    Family Communication: no family at the bedside   Disposition: Status is: Inpatient Remains inpatient appropriate because: pending transfer to CIR   Planned Discharge Destination: Rehab      Author: Coralie Keens, MD 11/02/2022 12:39 PM  For on call review [email or web address].

## 2022-11-02 NOTE — Plan of Care (Signed)
  Problem: Clinical Measurements: Goal: Will remain free from infection Outcome: Progressing Goal: Diagnostic test results will improve Outcome: Progressing Goal: Respiratory complications will improve Outcome: Progressing Goal: Cardiovascular complication will be avoided Outcome: Progressing   

## 2022-11-02 NOTE — Progress Notes (Signed)
TRH night cross cover note:   I was notified by RN of the patient's most recent BP of 82/64 mmHg, HR in the 80's - 90's. Patient asymptomatic at this time.  Per my chart review, it appears that his systolic blood pressures over the lateral portion of the afternoon I been in the 70s to 90s mmhg, over which time he was undergoing hemodialysis, having return back to the floor from HD around 1945.   He is noted to be on midodrine on a every 8 hour basis, with most recent dose of midodrine occurring greater than 9 hours ago at this point.   As his maps remain greater than 65 and as he is asymptomatic, with blood pressures consistent with that throughout much of the afternoon, will provide an additional dose of midodrine at this time and continue to closely monitor ensuing blood pressure for development of symptoms or trend down relative to the above.     Newton Pigg, DO Hospitalist

## 2022-11-03 DIAGNOSIS — J189 Pneumonia, unspecified organism: Secondary | ICD-10-CM | POA: Diagnosis not present

## 2022-11-03 DIAGNOSIS — I10 Essential (primary) hypertension: Secondary | ICD-10-CM | POA: Diagnosis not present

## 2022-11-03 DIAGNOSIS — I4719 Other supraventricular tachycardia: Secondary | ICD-10-CM | POA: Diagnosis not present

## 2022-11-03 DIAGNOSIS — I120 Hypertensive chronic kidney disease with stage 5 chronic kidney disease or end stage renal disease: Secondary | ICD-10-CM | POA: Diagnosis not present

## 2022-11-03 DIAGNOSIS — N186 End stage renal disease: Secondary | ICD-10-CM | POA: Diagnosis not present

## 2022-11-03 DIAGNOSIS — E785 Hyperlipidemia, unspecified: Secondary | ICD-10-CM | POA: Diagnosis not present

## 2022-11-03 DIAGNOSIS — B9561 Methicillin susceptible Staphylococcus aureus infection as the cause of diseases classified elsewhere: Secondary | ICD-10-CM | POA: Diagnosis not present

## 2022-11-03 DIAGNOSIS — E440 Moderate protein-calorie malnutrition: Secondary | ICD-10-CM | POA: Diagnosis not present

## 2022-11-03 DIAGNOSIS — N250 Renal osteodystrophy: Secondary | ICD-10-CM | POA: Diagnosis not present

## 2022-11-03 DIAGNOSIS — D631 Anemia in chronic kidney disease: Secondary | ICD-10-CM | POA: Diagnosis not present

## 2022-11-03 DIAGNOSIS — I39 Endocarditis and heart valve disorders in diseases classified elsewhere: Secondary | ICD-10-CM | POA: Diagnosis not present

## 2022-11-03 DIAGNOSIS — Z992 Dependence on renal dialysis: Secondary | ICD-10-CM | POA: Diagnosis not present

## 2022-11-03 DIAGNOSIS — R7881 Bacteremia: Secondary | ICD-10-CM | POA: Diagnosis not present

## 2022-11-03 MED ORDER — ORAL CARE MOUTH RINSE: 15.0000 mL | OROMUCOSAL | Status: DC | PRN

## 2022-11-03 NOTE — Progress Notes (Addendum)
[facility] Progress Note   Subjective:    Seen and examined patient at bedside. SBPs remains in the 80s. He feels fine. Denies SOB, CP, and N/V. Next HD 8/13.  Objective Vitals:   11/02/22 2317 11/03/22 0417 11/03/22 0427 11/03/22 0757  BP: (!) 82/62  (!) 86/60   Pulse: 91  99   Resp: (!) 21  20   Temp: 97.9 F (36.6 C)  (!) 97.3 F (36.3 C)   TempSrc:   Axillary   SpO2: 94%  97% 95%  Weight:  72.5 kg    Height:       Physical Exam General: Alert male in NAD Heart: RRR, no murmurs, rubs or gallops Lungs: CTA bilaterally, on RA Abdomen: Soft, non-distended, +BS Extremities: No edema b/l lower extremities Dialysis Access: [facility]  Filed Weights   11/01/22 0506 11/02/22 0357 11/03/22 0417  Weight: 72 kg 71.7 kg 72.5 kg    Intake/Output Summary (Last 24 hours) at 11/03/2022 0845 Last data filed at 11/02/2022 2141 Gross per 24 hour  Intake 340.13 ml  Output --  Net 340.13 ml    Additional Objective Labs: Basic Metabolic Panel: Recent Labs  Lab 11/01/22 0348 11/02/22 0202 11/03/22 0002  NA 126* 126* 127*  K 3.5 3.3* 3.4*  CL 87* 90* 88*  CO2 18* 19* 19*  GLUCOSE 97 107* 92  BUN 54* 28* 48*  CREATININE 5.70* 4.04* 5.40*  CALCIUM 9.0 8.7* 9.2  PHOS 6.4* 4.7* 6.5*   Liver Function Tests: Recent Labs  Lab 11/01/22 0348 11/02/22 0202 11/03/22 0002  ALBUMIN 2.8* 3.1* 3.2*   No results for input(s): "LIPASE", "AMYLASE" in the last 168 hours. CBC: Recent Labs  Lab 10/27/22 1158 10/29/22 0148  WBC 11.3* 9.9  NEUTROABS  --  7.1  HGB 9.3* 9.4*  HCT 30.3* 31.2*  MCV 80.4 83.9  PLT 285 319   Blood Culture    Component Value Date/Time   SDES BLOOD BLOOD RIGHT ARM 10/05/2022 2032   SPECREQUEST  10/05/2022 2032    BOTTLES DRAWN AEROBIC AND ANAEROBIC Blood Culture adequate volume   CULT  10/05/2022 2032    NO GROWTH 5 DAYS Performed at [facility], [address] [place], [place] [address]    REPTSTATUS 10/10/2022 FINAL 10/05/2022  2032    Cardiac Enzymes: No results for input(s): "CKTOTAL", "CKMB", "CKMBINDEX", "TROPONINI" in the last 168 hours. CBG: No results for input(s): "GLUCAP" in the last 168 hours. Iron Studies: No results for input(s): "IRON", "TIBC", "TRANSFERRIN", "FERRITIN" in the last 72 hours. Lab Results  Component Value Date   INR 1.4 (H) 10/10/2022   INR 1.4 (H) 09/10/2022   INR 1.13 09/23/2017   Studies/Results: No results found.  Medications:  sodium chloride 10 mL/hr at 10/18/22 2141    ceFAZolin (ANCEF) IV Stopped (11/01/22 2150)    (feeding supplement) PROSource Plus  30 mL Oral TID WC   arformoterol  15 mcg Nebulization BID   Chlorhexidine Gluconate Cloth  6 each Topical Q0600   Darbepoetin Alfa  150 mcg Subcutaneous Q Thu-1800   feeding supplement (NEPRO CARB STEADY)  237 mL Oral TID BM   heparin  5,000 Units Subcutaneous Q8H   midodrine  20 mg Oral TID WC   multivitamin  1 tablet Oral QHS   pantoprazole  40 mg Oral Daily   revefenacin  175 mcg Nebulization Daily    Dialysis Orders: TTS at [facility]:45hr, 450/A1.5, EDW 85.7kg, 2K/2Ca bath, TDC, heparin 3000 unit bolus -  no ESA - Calcitriol 1.85mcg PO q HD  Assessment/Plan: MSSA bacteremia/endocarditis/L5-S1 discitis: Blood Cx 6/16 MSSA, negative Cx on 6/17, 6/19. S/p TDC line holiday. TTE was concerning for endocarditis. TEE cancelled d/t high risk factors. LS MRI with possible L5-S1 discitis. On IV Cefazolin 2g q HD x 6 weeks (until 11/04/22). 2. Hyponatremia: Likely 2nd volume overload. Na level improving, now 127.  3. BP/Volume overload: Hypotension preventing adequate UF. Required ICU transfer for CRRT 7/21 - 10/15/22. Reinforced fluid restrictions. Metoprolol stopped and Midodrine raised to 20mg  TID-okay to give an extra 20mg  mid-run during HD if needed. Current EDW ranging 71.5-72.5kg, need to keep his EDW low.. 4. Deconditioning [name]: was in CIR, was transferred back to 6E due to  medical complications. Hoping for transfer  back to CIR soon 5. ESRD: See above, continue HD on TTS schedule. Next HD 8/13. 6. Access: TDC pulled 6/18 for line holiday, then new LIJ TDC placed 7/01 per IR. For perm access: s/p L AVF ligation in 2019. Last seen by Dr. Edilia Bo 01/2022 - limited mobility in R arm so planning for L arm graft but had not scheduled surgery yet. Can revisit as an outpatient once bacteremia resolved.  7. Anemia (ESRD + ABLA, + FOBT): Hgb 9.4. Continue Aranesp q Thursday.   8. CKD-MBD: CorrCa elevated but improving, hold VDRA. Phos fluctuating, not on binders for now 9. Nutrition - On dysphagia diet and protein supplements   Salome Holmes, NP [facility] 11/03/2022,8:45 AM  LOS: 25 days

## 2022-11-03 NOTE — Plan of Care (Signed)

## 2022-11-03 NOTE — Plan of Care (Signed)
  Problem: Education: Goal: Knowledge of General Education information will improve Description: Including pain rating scale, medication(s)/side effects and non-pharmacologic comfort measures Outcome: Progressing   Problem: Health Behavior/Discharge Planning: Goal: Ability to manage health-related needs will improve Outcome: Progressing   Problem: Clinical Measurements: Goal: Will remain free from infection Outcome: Progressing Goal: Respiratory complications will improve Outcome: Progressing Goal: Cardiovascular complication will be avoided Outcome: Progressing   Problem: Activity: Goal: Risk for activity intolerance will decrease Outcome: Progressing   Problem: Nutrition: Goal: Adequate nutrition will be maintained Outcome: Progressing   Problem: Coping: Goal: Level of anxiety will decrease Outcome: Progressing   Problem: Elimination: Goal: Will not experience complications related to bowel motility Outcome: Progressing   Problem: Safety: Goal: Ability to remain free from injury will improve Outcome: Progressing   Problem: Skin Integrity: Goal: Risk for impaired skin integrity will decrease Outcome: Progressing   Problem: Education: Goal: Knowledge of General Education information will improve Description: Including pain rating scale, medication(s)/side effects and non-pharmacologic comfort measures Outcome: Progressing   Problem: Health Behavior/Discharge Planning: Goal: Ability to manage health-related needs will improve Outcome: Progressing

## 2022-11-03 NOTE — Progress Notes (Signed)
Inpatient Rehab Admissions Coordinator:   Awaiting insurance auth.  Will follow.   Estill Dooms, PT, DPT Admissions Coordinator [phone] 11/03/22  11:41 AM

## 2022-11-03 NOTE — Care Management Important Message (Signed)
Important Message  Patient Details  Name: Ryan [name] MRN: [id number] Date of Birth: 09/14/1960   Medicare Important Message Given:  Yes     [name] 11/03/2022, 11:35 AM

## 2022-11-03 NOTE — Progress Notes (Signed)
Mobility Specialist Progress Note:   11/03/22 1031  Mobility  Activity Ambulated with assistance in hallway  Level of Assistance Contact guard assist, steadying assist  Assistive Device Front wheel walker  Distance Ambulated (ft) 200 ft  Activity Response Tolerated well  Mobility Referral Yes  $Mobility charge 1 Mobility  Mobility Specialist Start Time (ACUTE ONLY) [id number]  Mobility Specialist Stop Time (ACUTE ONLY) [id number]  Mobility Specialist Time Calculation (min) (ACUTE ONLY) 10 min   Pt received in bed, hesitant but agreeable to ambulate. C/o slight back pain and overall tiredness d/t not getting much sleep last night. Needed no physical assistance throughout session. Returning to room pt requested to sit in his chair. Pt HR increased to 147-150 for a moment (ab 10 secs) before returning to 107-109. Call bell in reach and all needs met.  Pre Mobility HR 104 During Mobility HR 1117 Post Mobility HR 107  Thompson Grayer Mobility Specialist  Please contact vis Secure Chat or  Rehab Office [phone]

## 2022-11-03 NOTE — Progress Notes (Signed)
Progress Note   Patient: Ryan [name] DJM:[id number] DOB: Jul 07, 1960 DOA: 10/09/2022     25 DOS: the patient was seen and examined on 11/03/2022   Brief hospital course: [name] was admitted to the hospital with the working diagnosis of acute hypoxemic respiratory failure due to pneumonia in the setting of endocarditis with septic emboli.   62 yo male with the past medical history of hypertension, ESRD, and hyperlipidemia who was transferred from CIR to inpatient setting due to suspected new onset atrial fibrillation with RVR. Recent hospitalization 06/16 to 07/04 for mitral valve endocarditis, MSSA bacteremia with brain septic emboli.  One week after his discharge while in CIR he was diagnosed with pneumonia, and worsening anemia. On 07/18 he developed suspected atrial fibrillation prompting his transfer back to inpatient setting.  On his initial physical examination his blood pressure was 103/72, HR 101, RR 20 and temp 97.7, lungs with bibasilar rhonchi, heart with S1 and S2 present and irregular, abdomen with no distention and no lower extremity edema.   Na 124, K 3,9 Cl 88, bicarbonate 24, glucose 115 bun 40 cr 7,42 Wbc 14.3 hgb 7.7 plt 377  TSH 8,9   Chest radiograph with cardiomegaly, bilateral interstitial infiltrates symmetric and central, bilateral hilar vascular congestion. HD catheter in the left internal jugular vein with tip in the SVC.   EKG 135 bpm, normal axis, normal intervals, sinus rhythm with 1st degree AV block and PAC, no significant ST segment or T wave changes.   7/18 - Readmitted to [facility] for Afib RVR/SOB requiring BiPAP, inability to complete HD. 7/22 - Off of BiPAP, tolerating [place]. Remains anxious. Tolerating CRRT. 7/24 patient on sinus rhythm and off CRRT.  07/25 transferred to [facility].   08/04 patient with improvement in volume status, tolerating renal replacement therapy with ultrafiltration.  Has been placed on midodrine for blood pressure support.   Pending transfer back to CIR to continue physical therapy.  08/08 continue inpatient renal replacement therapy.   Assessment and Plan: * Multifocal atrial tachycardia Features of MAT, now on sinus rhythm.   Ruled out atrial fibrillation, (note from cardiology consultation from 07.18 concluded no atrial fibrillation).   08/04 EKG 106 bpm, normal axis, normal intervals, sinus rhythm with PAC, no significant ST segment or T wave changes.   Patient has been on diltiazem and then on metoprolol for rate control. Now due to hypotension, AV blockade has been discontinued.  Sinus rhythm on telemetry.   CAP (community acquired pneumonia) Acute hypoxemic respiratory failure.   Oxygenation has improved. Plan to continue antibiotic therapy until 08/13 with cefazolin.  Continue bronchodilator therapy. His 02 saturation is 100% on room air today.  ESRD on dialysis (HCC) Hyponatremia.   Na 127, K 3,4 Cl 88, bicarbonate 19, BUN 48.   Continue blood pressure support with midodrine (20 mg tid) for renal replacement therapy.  Patient is about 30 lbs lower than his pre admission weight.   Anemia of chronic renal disease.  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 Plan to continue with IV cefazolin.   L4 and S1 discitis and osteomyelitis, will need antibiotic therapy with IV cephalosporin until 08/13.    Hyperlipidemia Continue statin therapy.   Hypertension Hypotension.  Midodrine 20 mg tid.  Metoprolol has been discontinued.   Echocardiogram with preserved LV systolic function EF 55 to 60%, moderate LVH, RV systolic function preserved, LA with  mild dilatation, large rounded structure on the anterior leflet of the mitral valve, partially obstructing mitral inflow with ball-valve type motion. No mitral stenosis or mitral regurgitation. Mild aortic valve stenosis.     Moderate protein malnutrition (HCC) Continue nutritional supplementation,.         Subjective: Patient is feeling better, no chest pain or dyspnea, no edema or orthopnea, continue very weak and deconditioned not at his baseline   Physical Exam: Vitals:   11/03/22 0417 11/03/22 0427 11/03/22 0757 11/03/22 0810  BP:  (!) 86/60  92/61  Pulse:  99    Resp:  20  17  Temp:  (!) 97.3 F (36.3 C)  97.7 F (36.5 C)  TempSrc:  Axillary  Axillary  SpO2:  97% 95%   Weight: 72.5 kg     Height:       Neurology awake and alert ENT with mild pallor Cardiovascular with S1 and S2 present and rhythmic, positive systolic murmur at the apex.  Respiratory with no rales or wheezing, no rhonchi Abdomen with no distention  Data Reviewed:    Family Communication: no family at the bedside   Disposition: Status is: Inpatient Remains inpatient appropriate because: pending transfer to rehabilitation CIR  Planned Discharge Destination: Rehab    Author: Coralie Keens, MD 11/03/2022 2:26 PM  For on call review [email or web address].

## 2022-11-04 LAB — CBC WITH DIFFERENTIAL/PLATELET
Abs Immature Granulocytes: 0.05 K/uL (ref 0.00–0.07)
Basophils Absolute: 0.1 K/uL (ref 0.0–0.1)
Basophils Relative: 1 %
Eosinophils Absolute: 0.2 K/uL (ref 0.0–0.5)
Eosinophils Relative: 2 %
HCT: 31 % — ABNORMAL LOW (ref 39.0–52.0)
Hemoglobin: 9.4 g/dL — ABNORMAL LOW (ref 13.0–17.0)
Immature Granulocytes: 1 %
Lymphocytes Relative: 20 %
Lymphs Abs: 1.9 K/uL (ref 0.7–4.0)
MCH: 24.8 pg — ABNORMAL LOW (ref 26.0–34.0)
MCHC: 30.3 g/dL (ref 30.0–36.0)
MCV: 81.8 fL (ref 80.0–100.0)
Monocytes Absolute: 0.9 K/uL (ref 0.1–1.0)
Monocytes Relative: 9 %
Neutro Abs: 6.3 K/uL (ref 1.7–7.7)
Neutrophils Relative %: 67 %
Platelets: 280 K/uL (ref 150–400)
RBC: 3.79 MIL/uL — ABNORMAL LOW (ref 4.22–5.81)
RDW: 20.5 % — ABNORMAL HIGH (ref 11.5–15.5)
WBC: 9.3 K/uL (ref 4.0–10.5)
nRBC: 0.8 % — ABNORMAL HIGH (ref 0.0–0.2)

## 2022-11-04 LAB — RENAL FUNCTION PANEL
Albumin: 3.1 g/dL — ABNORMAL LOW (ref 3.5–5.0)
Anion gap: 21 — ABNORMAL HIGH (ref 5–15)
BUN: 68 mg/dL — ABNORMAL HIGH (ref 8–23)
CO2: 17 mmol/L — ABNORMAL LOW (ref 22–32)
Calcium: 9.2 mg/dL (ref 8.9–10.3)
Chloride: 87 mmol/L — ABNORMAL LOW (ref 98–111)
Creatinine, Ser: 6.91 mg/dL — ABNORMAL HIGH (ref 0.61–1.24)
GFR, Estimated: 8 mL/min — ABNORMAL LOW (ref >60)
Glucose, Bld: 143 mg/dL — ABNORMAL HIGH (ref 70–99)
Phosphorus: 7.3 mg/dL — ABNORMAL HIGH (ref 2.5–4.6)
Potassium: 3.7 mmol/L (ref 3.5–5.1)
Sodium: 125 mmol/L — ABNORMAL LOW (ref 135–145)

## 2022-11-04 MED ORDER — ALTEPLASE 2 MG IJ SOLR: 2.0000 mg | Freq: Once | INTRAMUSCULAR | Status: DC | PRN

## 2022-11-04 MED ORDER — HEPARIN SODIUM (PORCINE) 1000 UNIT/ML IJ SOLN
INTRAMUSCULAR | Status: AC
  Administered 2022-11-04: 3000 [IU]
  Filled 2022-11-04: qty 3

## 2022-11-04 MED ORDER — HEPARIN SODIUM (PORCINE) 1000 UNIT/ML DIALYSIS
1000.0000 [IU] | INTRAMUSCULAR | Status: DC | PRN
  Administered 2022-11-04: 1000 [IU] via INTRAVENOUS_CENTRAL

## 2022-11-04 MED ORDER — LIDOCAINE-PRILOCAINE 2.5-2.5 % EX CREA: 1.0000 | TOPICAL_CREAM | CUTANEOUS | Status: DC | PRN

## 2022-11-04 MED ORDER — PENTAFLUOROPROP-TETRAFLUOROETH EX AERO: 1.0000 | INHALATION_SPRAY | CUTANEOUS | Status: DC | PRN

## 2022-11-04 MED ORDER — LIDOCAINE HCL (PF) 1 % IJ SOLN: 5.0000 mL | INTRAMUSCULAR | Status: DC | PRN

## 2022-11-04 MED ORDER — HEPARIN SODIUM (PORCINE) 1000 UNIT/ML IJ SOLN
INTRAMUSCULAR | Status: AC
  Filled 2022-11-04: qty 4

## 2022-11-04 NOTE — Plan of Care (Signed)
  Problem: Education: Goal: Knowledge of General Education information will improve Description: Including pain rating scale, medication(s)/side effects and non-pharmacologic comfort measures Outcome: Progressing   Problem: Health Behavior/Discharge Planning: Goal: Ability to manage health-related needs will improve Outcome: Progressing   Problem: Clinical Measurements: Goal: Ability to maintain clinical measurements within normal limits will improve Outcome: Progressing Goal: Respiratory complications will improve Outcome: Progressing Goal: Cardiovascular complication will be avoided Outcome: Progressing   Problem: Activity: Goal: Risk for activity intolerance will decrease Outcome: Progressing   Problem: Nutrition: Goal: Adequate nutrition will be maintained Outcome: Progressing   Problem: Coping: Goal: Level of anxiety will decrease Outcome: Progressing   Problem: Elimination: Goal: Will not experience complications related to bowel motility Outcome: Progressing   Problem: Safety: Goal: Ability to remain free from injury will improve Outcome: Progressing   Problem: Skin Integrity: Goal: Risk for impaired skin integrity will decrease Outcome: Progressing   Problem: Education: Goal: Knowledge of General Education information will improve Description: Including pain rating scale, medication(s)/side effects and non-pharmacologic comfort measures Outcome: Progressing

## 2022-11-04 NOTE — Progress Notes (Signed)
   11/04/22 1218  Vitals  Temp 98.7 F (37.1 C)  Pulse Rate 87  Resp (!) 22  BP 107/67  SpO2 99 %  O2 Device Room Air  Oxygen Therapy  Patient Activity (if Appropriate) In bed  Pulse Oximetry Type Continuous  Post Treatment  Dialyzer Clearance Clear  Duration of HD Treatment -hour(s) 3.5 hour(s)  Hemodialysis Intake (mL) 0 mL  Liters Processed 83.9  Fluid Removed (mL) 2500 mL  Tolerated HD Treatment Yes  Post-Hemodialysis Comments Pt. tolerated treatment without difficulties, VSS and UF met and Admin medication. Report called to 6E bedside RN Denice Bors   Received patient in bed to unit.  Alert and oriented.  Informed consent signed and in chart.   TX duration:3.5  Patient tolerated well.  Transported back to the room  Alert, without acute distress.  Hand-off given to patient's nurse.   Access used: Yes Access issues: No  Total UF removed: 2500 Medication(s) given: See MAR Post HD VS: See Above Grid Post HD weight: 71.0 kg   [name]

## 2022-11-04 NOTE — Progress Notes (Signed)
[facility] Progress Note   Subjective:    Seen and examined patient on HD. Patient with non-productive cough but otherwise feels well. Tolerating UFG 2.5L and BP is 94/69. Denies SOB, CP, and N/V.  Objective Vitals:   11/04/22 0811 11/04/22 0815 11/04/22 0820 11/04/22 0830  BP:  104/69 97/69 94/69   Pulse:  94 94 93  Resp:  16 19 (!) 30  Temp:  98.7 F (37.1 C)    TempSrc:      SpO2:  98% 95% 97%  Weight: (S) 73.5 kg     Height:       Physical Exam General: Alert male in NAD Heart: RRR, no murmurs, rubs or gallops Lungs: CTA bilaterally, on RA Abdomen: Soft, non-distended, +BS Extremities: No edema b/l lower extremities Dialysis Access: [facility]  Filed Weights   11/03/22 0417 11/04/22 0403 11/04/22 0811  Weight: 72.5 kg 73 kg (S) 73.5 kg    Intake/Output Summary (Last 24 hours) at 11/04/2022 0918 Last data filed at 11/03/2022 2100 Gross per 24 hour  Intake 600 ml  Output --  Net 600 ml    Additional Objective Labs: Basic Metabolic Panel: Recent Labs  Lab 11/02/22 0202 11/03/22 0002 11/04/22 0034  NA 126* 127* 125*  K 3.3* 3.4* 3.7  CL 90* 88* 87*  CO2 19* 19* 17*  GLUCOSE 107* 92 143*  BUN 28* 48* 68*  CREATININE 4.04* 5.40* 6.91*  CALCIUM 8.7* 9.2 9.2  PHOS 4.7* 6.5* 7.3*   Liver Function Tests: Recent Labs  Lab 11/02/22 0202 11/03/22 0002 11/04/22 0034  ALBUMIN 3.1* 3.2* 3.1*   No results for input(s): "LIPASE", "AMYLASE" in the last 168 hours. CBC: Recent Labs  Lab 10/29/22 0148 11/04/22 0034  WBC 9.9 9.3  NEUTROABS 7.1 6.3  HGB 9.4* 9.4*  HCT 31.2* 31.0*  MCV 83.9 81.8  PLT 319 280   Blood Culture    Component Value Date/Time   SDES BLOOD BLOOD RIGHT ARM 10/05/2022 2032   SPECREQUEST  10/05/2022 2032    BOTTLES DRAWN AEROBIC AND ANAEROBIC Blood Culture adequate volume   CULT  10/05/2022 2032    NO GROWTH 5 DAYS Performed at [facility], [address] [place], [place] [address]    REPTSTATUS 10/10/2022  FINAL 10/05/2022 2032    Cardiac Enzymes: No results for input(s): "CKTOTAL", "CKMB", "CKMBINDEX", "TROPONINI" in the last 168 hours. CBG: No results for input(s): "GLUCAP" in the last 168 hours. Iron Studies: No results for input(s): "IRON", "TIBC", "TRANSFERRIN", "FERRITIN" in the last 72 hours. Lab Results  Component Value Date   INR 1.4 (H) 10/10/2022   INR 1.4 (H) 09/10/2022   INR 1.13 09/23/2017   Studies/Results: No results found.  Medications:  sodium chloride 10 mL/hr at 10/18/22 2141    ceFAZolin (ANCEF) IV Stopped (11/01/22 2150)    (feeding supplement) PROSource Plus  30 mL Oral TID WC   arformoterol  15 mcg Nebulization BID   Chlorhexidine Gluconate Cloth  6 each Topical Q0600   Darbepoetin Alfa  150 mcg Subcutaneous Q Thu-1800   feeding supplement (NEPRO CARB STEADY)  237 mL Oral TID BM   heparin  5,000 Units Subcutaneous Q8H   midodrine  20 mg Oral TID WC   multivitamin  1 tablet Oral QHS   pantoprazole  40 mg Oral Daily   revefenacin  175 mcg Nebulization Daily    Dialysis Orders: TTS at [facility] 3:45hr, 450/A1.5, EDW 85.7kg, 2K/2Ca bath, TDC, heparin 3000 unit bolus - no  ESA - Calcitriol 1.60mcg PO q HD   Assessment/Plan: MSSA bacteremia/endocarditis/L5-S1 discitis: Blood Cx 6/16 MSSA, negative Cx on 6/17, 6/19. S/p TDC line holiday. TTE was concerning for endocarditis. TEE cancelled d/t high risk factors. LS MRI with possible L5-S1 discitis. On IV Cefazolin 2g q HD x 6 weeks (until 11/04/22). 2. Hyponatremia: Likely 2nd volume overload. Na level improving, now 127.  3. BP/Volume overload: Hypotension preventing adequate UF. Required ICU transfer for CRRT 7/21 - 10/15/22. Reinforced fluid restrictions. Metoprolol stopped and Midodrine raised to 20mg  TID-okay to give an extra 20mg  mid-run during HD if needed. Current EDW ranging 71.5-72.5kg, need to keep his EDW low.. 4. Deconditioning [name]: was in CIR, was transferred back to 6E due to  medical complications.  Hoping for transfer back to CIR soon 5. ESRD: See above, continue HD on TTS schedule. On HD. 6. Access: TDC pulled 6/18 for line holiday, then new LIJ TDC placed 7/01 per IR. For perm access: s/p L AVF ligation in 2019. Last seen by Dr. Edilia Bo 01/2022 - limited mobility in R arm so planning for L arm graft but had not scheduled surgery yet. Can revisit as an outpatient once bacteremia resolved.  7. Anemia (ESRD + ABLA, + FOBT): Hgb 9.4. Continue Aranesp q Thursday.   8. CKD-MBD: CorrCa elevated but improving, hold VDRA. Phos fluctuating, not on binders for now 9. Nutrition - On dysphagia diet and protein supplements   Salome Holmes, NP [facility] 11/04/2022,9:18 AM  LOS: 26 days

## 2022-11-04 NOTE — Progress Notes (Signed)
Inpatient Rehab Admissions Coordinator:   Faxed updates to insurance.    Estill Dooms, PT, DPT Admissions Coordinator [phone] 11/04/22  3:44 PM

## 2022-11-04 NOTE — Progress Notes (Signed)
Physical Therapy Treatment Patient Details Name: Ryan [name] MRN: [id number] DOB: 12-14-1960 Today's Date: 11/04/2022   History of Present Illness 62 yo male presents to [facility] from CIR on 7/18 for afib RVR, aspiration PNA. RR on 7/21 for acute respiratory distress due to pulmonary edema requiring bipap and CRRT, and transfer to ICU. 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 S/p  HD cath L subclavian on 7/1. PMH includes HTN, HLD, ESRD on HD TTS.    PT Comments  Pt up with OT in bathroom upon PT arrival to room, pt agreeable to PT session with focus on improving tolerance and strengthening. Pt tolerated hallway ambulation distance with cues for form/safety throughout, does fatigue sooner vs normal because of preceding OT session and HD today. Pt tolerated LE exercise upon return to room well, is very encouraged by family present at bedside. PT to continue to follow.   HR 100-120s    If plan is discharge home, recommend the following: Help with stairs or ramp for entrance;Assist for transportation;Assistance with cooking/housework;A little help with walking and/or transfers;A little help with bathing/dressing/bathroom   Can travel by private Psychologist, clinical (4 wheels)    Recommendations for Other Services       Precautions / Restrictions Precautions Precautions: Fall Precaution Comments: watch for hypotension Restrictions Weight Bearing Restrictions: No     Mobility  Bed Mobility               General bed mobility comments: up on BSC with OT    Transfers Overall transfer level: Needs assistance Equipment used: Rolling walker (2 wheels) Transfers: Sit to/from Stand Sit to Stand: Contact guard assist           General transfer comment: cues for hand placement when rising/sitting     Ambulation/Gait Ambulation/Gait assistance: Contact guard assist Gait Distance (Feet): 120 Feet Assistive device: Rolling walker (2 wheels) Gait Pattern/deviations: Step-through pattern, Decreased stride length Gait velocity: decr     General Gait Details: cues for upright posture, relaxing shoulder girdle, close guard for safety   Stairs             Wheelchair Mobility     Tilt Bed    Modified Rankin (Stroke Patients Only)       Balance Overall balance assessment: Needs assistance Sitting-balance support: No upper extremity supported, Feet supported Sitting balance-Leahy Scale: Good     Standing balance support: Bilateral upper extremity supported (on RW 2/2 patient fatigue from HD) Standing balance-Leahy Scale: Poor Standing balance comment: reliant on external support                            Cognition Arousal: Alert Behavior During Therapy: WFL for tasks assessed/performed Overall Cognitive Status: Within Functional Limits for tasks assessed                                 General Comments: motivated, engaged        Exercises Other Exercises Other Exercises: sit to stand from recliner with single UE support x 10 reps. Other Exercises: standing marches x20 with bilat UE support    General Comments        Pertinent Vitals/Pain Pain Assessment Pain Assessment: Faces Faces Pain Scale: Hurts a  little bit Pain Location: LEs Pain Descriptors / Indicators: Sore Pain Intervention(s): Limited activity within patient's tolerance, Monitored during session, Repositioned    Home Living                          Prior Function            PT Goals (current goals can now be found in the care plan section) Acute Rehab PT Goals Patient Stated Goal: back to rehab to get stronger PT Goal Formulation: With patient Time For Goal Achievement: 11/17/22 Potential to Achieve Goals: Good Progress towards PT goals:  Progressing toward goals    Frequency    Min 1X/week      PT Plan Current plan remains appropriate    Co-evaluation              AM-PAC PT "6 Clicks" Mobility   Outcome Measure  Help needed turning from your back to your side while in a flat bed without using bedrails?: A Little Help needed moving from lying on your back to sitting on the side of a flat bed without using bedrails?: A Little Help needed moving to and from a bed to a chair (including a wheelchair)?: A Little Help needed standing up from a chair using your arms (e.g., wheelchair or bedside chair)?: A Little Help needed to walk in hospital room?: A Little Help needed climbing 3-5 steps with a railing? : A Little 6 Click Score: 18    End of Session   Activity Tolerance: Patient tolerated treatment well Patient left: in chair;with call bell/phone within reach;with family/visitor present (pt's daughter and grandchildren present) Nurse Communication: Mobility status PT Visit Diagnosis: Other abnormalities of gait and mobility (R26.89);Muscle weakness (generalized) (M62.81)     Time: 8119-1478 PT Time Calculation (min) (ACUTE ONLY): 17 min  Charges:    $Therapeutic Activity: 8-22 mins PT General Charges $$ ACUTE PT VISIT: 1 Visit                     Marye Round, PT DPT Acute Rehabilitation Services Secure Chat Preferred  Office [phone]     Sheliah Plane 11/04/2022, 3:37 PM

## 2022-11-04 NOTE — Procedures (Signed)
I was present at this dialysis session. I have reviewed the session itself and made appropriate changes.   Filed Weights   11/03/22 0417 11/04/22 0403 11/04/22 0811  Weight: 72.5 kg 73 kg (S) 73.5 kg    Recent Labs  Lab 11/04/22 0034  NA 125*  K 3.7  CL 87*  CO2 17*  GLUCOSE 143*  BUN 68*  CREATININE 6.91*  CALCIUM 9.2  PHOS 7.3*    Recent Labs  Lab 10/29/22 0148 11/04/22 0034  WBC 9.9 9.3  NEUTROABS 7.1 6.3  HGB 9.4* 9.4*  HCT 31.2* 31.0*  MCV 83.9 81.8  PLT 319 280    Scheduled Meds:  (feeding supplement) PROSource Plus  30 mL Oral TID WC   arformoterol  15 mcg Nebulization BID   Chlorhexidine Gluconate Cloth  6 each Topical Q0600   Darbepoetin Alfa  150 mcg Subcutaneous Q Thu-1800   feeding supplement (NEPRO CARB STEADY)  237 mL Oral TID BM   heparin  5,000 Units Subcutaneous Q8H   midodrine  20 mg Oral TID WC   multivitamin  1 tablet Oral QHS   pantoprazole  40 mg Oral Daily   revefenacin  175 mcg Nebulization Daily   Continuous Infusions:  sodium chloride 10 mL/hr at 10/18/22 2141    ceFAZolin (ANCEF) IV Stopped (11/01/22 2150)   PRN Meds:.sodium chloride, acetaminophen **OR** acetaminophen, alteplase, guaiFENesin, heparin, ipratropium-albuterol, lidocaine (PF), lidocaine-prilocaine, loratadine, ondansetron **OR** ondansetron (ZOFRAN) IV, mouth rinse, oxyCODONE, pentafluoroprop-tetrafluoroeth, phenol, senna-docusate, traZODone   Louie Bun,  MD 11/04/2022, 10:07 AM

## 2022-11-04 NOTE — Progress Notes (Signed)
Inpatient Rehab Admissions Coordinator:   Continue to await determination from insurance.  PT/OT to see today after HD since they weren't able to see him yesterday for updated notes.  Will send to insurance once they have a chance to work with him.   Estill Dooms, PT, DPT Admissions Coordinator [phone] 11/04/22  11:33 AM

## 2022-11-04 NOTE — Progress Notes (Signed)
Progress Note   Patient: JAHEED ADEM ZOX:[id number] DOB: 03-07-1961 DOA: 10/09/2022     26 DOS: the patient was seen and examined on 11/04/2022   Brief hospital course: Mr. Schweiss was admitted to the hospital with the working diagnosis of acute hypoxemic respiratory failure due to pneumonia in the setting of endocarditis with septic emboli.   62 yo male with the past medical history of hypertension, ESRD, and hyperlipidemia who was transferred from CIR to inpatient setting due to suspected new onset atrial fibrillation with RVR. Recent hospitalization 06/16 to 07/04 for mitral valve endocarditis, MSSA bacteremia with brain septic emboli.  One week after his discharge while in CIR he was diagnosed with pneumonia, and worsening anemia. On 07/18 he developed suspected atrial fibrillation prompting his transfer back to inpatient setting.  On his initial physical examination his blood pressure was 103/72, HR 101, RR 20 and temp 97.7, lungs with bibasilar rhonchi, heart with S1 and S2 present and irregular, abdomen with no distention and no lower extremity edema.   Na 124, K 3,9 Cl 88, bicarbonate 24, glucose 115 bun 40 cr 7,42 Wbc 14.3 hgb 7.7 plt 377  TSH 8,9   Chest radiograph with cardiomegaly, bilateral interstitial infiltrates symmetric and central, bilateral hilar vascular congestion. HD catheter in the left internal jugular vein with tip in the SVC.   EKG 135 bpm, normal axis, normal intervals, sinus rhythm with 1st degree AV block and PAC, no significant ST segment or T wave changes.   7/18 - Readmitted to [facility] for Afib RVR/SOB requiring BiPAP, inability to complete HD. 7/22 - Off of BiPAP, tolerating Salter. Remains anxious. Tolerating CRRT. 7/24 patient on sinus rhythm and off CRRT.  07/25 transferred to [facility].   08/04 patient with improvement in volume status, tolerating renal replacement therapy with ultrafiltration.  Has been placed on midodrine for blood pressure support.   Pending transfer back to CIR to continue physical therapy.  08/08 continue inpatient renal replacement therapy.  08/13 pending transfer to CIR.   Assessment and Plan: * Multifocal atrial tachycardia Features of MAT, now on sinus rhythm.   Ruled out atrial fibrillation, (note from cardiology consultation from 07.18 concluded no atrial fibrillation).   08/04 EKG 106 bpm, normal axis, normal intervals, sinus rhythm with PAC, no significant ST segment or T wave changes.   Patient has been on diltiazem and then on metoprolol for rate control. Now due to hypotension, AV blockade has been discontinued.  Sinus rhythm on telemetry.   CAP (community acquired pneumonia) Acute hypoxemic respiratory failure.   Oxygenation has improved. Plan to continue antibiotic therapy until 08/13 with cefazolin.  Continue bronchodilator therapy. His 02 saturation is 100% on room air today.  ESRD on dialysis (HCC) Hyponatremia.   Na 125, K 3,7 bicarbonate 17.   Continue blood pressure support with midodrine (20 mg tid) for renal replacement therapy.  Patient is about 30 lbs lower than his pre admission weight.   Anemia of chronic renal disease.  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 Plan to continue with IV cefazolin.   L4 and S1 discitis and osteomyelitis, will need antibiotic therapy with IV cephalosporin until 08/13.    Hyperlipidemia Continue statin therapy.   Hypertension Hypotension.  Midodrine 20 mg tid.  Metoprolol has been discontinued.   Echocardiogram with preserved LV systolic function EF 55 to 60%, moderate LVH, RV systolic function preserved,  LA with mild dilatation, large rounded structure on the anterior leflet of the mitral valve, partially obstructing mitral inflow with ball-valve type motion. No mitral stenosis or mitral regurgitation. Mild aortic valve  stenosis.    Moderate protein malnutrition (HCC) Continue nutritional supplementation,.         Subjective: Patient with no chest pain or dyspnea, he is tolerating HD well.   Physical Exam: Vitals:   11/04/22 1130 11/04/22 1216 11/04/22 1218 11/04/22 1223  BP: 96/69  107/67 94/61  Pulse: 99  87 99  Resp: (!) 24  (!) 22 18  Temp:   98.7 F (37.1 C)   TempSrc:      SpO2: 98%  99% 95%  Weight:  (S) 71 kg    Height:       Neurology awake and alert ENT with mild pallor Cardiovascular with S1 and S2 present and regular, positive systolic murmur at the apex Respiratory with no rales or wheezing Abdomen with no distention  No lower extremity edema  Data Reviewed:    Family Communication: his daughter is at the bedside   Disposition: Status is: Inpatient Remains inpatient appropriate because: pending transfer to CIR   Planned Discharge Destination: Rehab      Author: Coralie Keens, MD 11/04/2022 4:20 PM  For on call review [email or web address].

## 2022-11-04 NOTE — Progress Notes (Signed)
Occupational Therapy Treatment Patient Details Name: Ryan [name] MRN: [id number] DOB: September 20, 1960 Today's Date: 11/04/2022   History of present illness 62 yo male presents to [facility] from CIR on 7/18 for afib RVR, aspiration PNA. RR on 7/21 for acute respiratory distress due to pulmonary edema requiring bipap and CRRT, and transfer to ICU. 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 S/p  HD cath L subclavian on 7/1. PMH includes HTN, HLD, ESRD on HD TTS.   OT comments  Patient sitting upright in straight back chair upon arrival into room ad agreeable to participate in OT session.  Patient completed sit to stand from chair with CGA and completed functional mobility into bathroom ad completed toileting transfer with BSC over to elevate height of seated surface, with CGA.  Patient completed seated UB grooming with setup and mi A for bathing underarms 2/2 patient reporting shoulder injuries.  Patient completed  peri hygiene  standing with CGA/  Patient donned underwear with mod a to thread LEs into garment and then CGA for sit to stand and to don from thigh level to waist.  Patient left in bathroom with PT to completed functional mobility using RW.      If plan is discharge home, recommend the following:  Assistance with cooking/housework;Help with stairs or ramp for entrance;Assist for transportation;Direct supervision/assist for medications management;Direct supervision/assist for financial management;A little help with walking and/or transfers;A little help with bathing/dressing/bathroom   Equipment Recommendations  Tub/shower bench;BSC/3in1    Recommendations for Other Services Rehab consult    Precautions / Restrictions Precautions Precautions: Fall Precaution Comments: watch for hypotension Restrictions Weight Bearing Restrictions: No       Mobility Bed  Mobility                    Transfers Overall transfer level: Needs assistance Equipment used: Rolling walker (2 wheels) Transfers: Sit to/from Stand Sit to Stand: Contact guard assist                 Balance Overall balance assessment: Needs assistance Sitting-balance support: No upper extremity supported, Feet supported Sitting balance-Leahy Scale: Good     Standing balance support: Bilateral upper extremity supported (on RW 2/2 patient fatigue from HD)                               ADL either performed or assessed with clinical judgement   ADL Overall ADL's : Needs assistance/impaired     Grooming: Wash/dry face;Sitting;Set up   Upper Body Bathing: Sitting;Minimal assistance (to bathe under arms)   Lower Body Bathing: Contact guard assist;Sit to/from stand   Upper Body Dressing : Minimal assistance;Sitting Upper Body Dressing Details (indicate cue type and reason): to don hospital gown Lower Body Dressing: Moderate assistance (to thread underwear onto LEs)   Toilet Transfer: Contact guard assist;BSC/3in1 (BSC placed over commode to elevate height)   Toileting- Clothing Manipulation and Hygiene: Contact guard assist;Sit to/from stand (verbal cues to push up from [facility] armrests)       Functional mobility during ADLs: Contact guard assist;Rolling walker (2 wheels)      Extremity/Trunk Assessment Upper Extremity Assessment Upper Extremity Assessment: Generalized weakness RUE Deficits / Details: FF to 90 degrees due to rotator cuff tear, generalized weakness RUE Sensation: WNL RUE Coordination: WNL LUE Deficits / Details: generalized weakness LUE Sensation: WNL LUE  Coordination: WNL            Vision       Perception     Praxis      Cognition Arousal: Alert Behavior During Therapy: WFL for tasks assessed/performed Overall Cognitive Status: Within Functional Limits for tasks assessed                                           Exercises      Shoulder Instructions       General Comments      Pertinent Vitals/ Pain       Pain Assessment Pain Assessment: No/denies pain Pain Score: 0-No pain  Home Living                                          Prior Functioning/Environment              Frequency  Min 1X/week        Progress Toward Goals  OT Goals(current goals can now be found in the care plan section)  Progress towards OT goals: Progressing toward goals  Acute Rehab OT Goals OT Goal Formulation: With patient Time For Goal Achievement: 11/05/22 Potential to Achieve Goals: Good ADL Goals Pt Will Perform Grooming: standing;with supervision Pt Will Perform Lower Body Bathing: with supervision;sit to/from stand Pt Will Perform Upper Body Dressing: with modified independence;sitting Pt Will Perform Lower Body Dressing: with supervision;sit to/from stand Pt Will Transfer to Toilet: with supervision;ambulating;regular height toilet Pt Will Perform Toileting - Clothing Manipulation and hygiene: with supervision;sit to/from stand Pt/caregiver will Perform Home Exercise Program: Increased strength;Both right and left upper extremity;With theraband;With written HEP provided Additional ADL Goal #1: Pt will generalize energy conservation strategies in ADLs. Additional ADL Goal #2: Patient will demonstrate ability to successfully sequance and complete a 3 step therapeutic or functional activity requiring alternating attention with Mod I while sitting EOB for 5 or more minutes with Fair balance.  Plan Discharge plan remains appropriate    Co-evaluation                 AM-PAC OT "6 Clicks" Daily Activity     Outcome Measure   Help from another person eating meals?: None Help from another person taking care of personal grooming?: A Little Help from another person toileting, which includes using toliet, bedpan, or urinal?: A Little Help from another person  bathing (including washing, rinsing, drying)?: A Little Help from another person to put on and taking off regular upper body clothing?: A Little Help from another person to put on and taking off regular lower body clothing?: A Little 6 Click Score: 19    End of Session    OT Visit Diagnosis: Unsteadiness on feet (R26.81)   Activity Tolerance Patient tolerated treatment well   Patient Left Other (comment) (with PT in room)   Nurse Communication Other (comment)        Time: 2725-3664 OT Time Calculation (min): 30 min  Charges: OT General Charges $OT Visit: 1 Visit OT Treatments $Self Care/Home Management : 23-37 mins  Governor Specking OT/L  Denice Paradise 11/04/2022, 3:01 PM

## 2022-11-05 DIAGNOSIS — I959 Hypotension, unspecified: Secondary | ICD-10-CM

## 2022-11-05 DIAGNOSIS — I10 Essential (primary) hypertension: Secondary | ICD-10-CM | POA: Diagnosis not present

## 2022-11-05 DIAGNOSIS — E871 Hypo-osmolality and hyponatremia: Secondary | ICD-10-CM | POA: Diagnosis not present

## 2022-11-05 DIAGNOSIS — M869 Osteomyelitis, unspecified: Secondary | ICD-10-CM | POA: Diagnosis not present

## 2022-11-05 DIAGNOSIS — N186 End stage renal disease: Secondary | ICD-10-CM | POA: Diagnosis not present

## 2022-11-05 DIAGNOSIS — J189 Pneumonia, unspecified organism: Secondary | ICD-10-CM | POA: Diagnosis not present

## 2022-11-05 DIAGNOSIS — E440 Moderate protein-calorie malnutrition: Secondary | ICD-10-CM | POA: Diagnosis not present

## 2022-11-05 DIAGNOSIS — R059 Cough, unspecified: Secondary | ICD-10-CM

## 2022-11-05 DIAGNOSIS — E785 Hyperlipidemia, unspecified: Secondary | ICD-10-CM | POA: Diagnosis not present

## 2022-11-05 DIAGNOSIS — E8770 Fluid overload, unspecified: Secondary | ICD-10-CM | POA: Diagnosis not present

## 2022-11-05 DIAGNOSIS — R7881 Bacteremia: Secondary | ICD-10-CM | POA: Diagnosis not present

## 2022-11-05 DIAGNOSIS — I39 Endocarditis and heart valve disorders in diseases classified elsewhere: Secondary | ICD-10-CM | POA: Diagnosis not present

## 2022-11-05 DIAGNOSIS — I4719 Other supraventricular tachycardia: Secondary | ICD-10-CM | POA: Diagnosis not present

## 2022-11-05 NOTE — Progress Notes (Signed)
Inpatient Rehab Admissions Coordinator:   Still unable to reach pt via phone, but note PT has worked with patient and discussed option of discharge home with [facility] which pt appears to be agreeable with.  I will sign off for CIR.  Please contact me if needed.   Estill Dooms, PT, DPT Admissions Coordinator [phone] 11/05/22  3:16 PM

## 2022-11-05 NOTE — Progress Notes (Signed)
PROGRESS NOTE    Ryan [name]  WUJ:[id number] DOB: 09/19/1960 DOA: 10/09/2022 PCP: Sharin Grave, MD    No chief complaint on file.   Brief Narrative:  Mr. Wald was admitted to the hospital with the working diagnosis of acute hypoxemic respiratory failure due to pneumonia in the setting of endocarditis with septic emboli.    62 yo male with the past medical history of hypertension, ESRD, and hyperlipidemia who was transferred from CIR to inpatient setting due to suspected new onset atrial fibrillation with RVR. Recent hospitalization 06/16 to 07/04 for mitral valve endocarditis, MSSA bacteremia with brain septic emboli.  One week after his discharge while in CIR he was diagnosed with pneumonia, and worsening anemia. On 07/18 he developed suspected atrial fibrillation prompting his transfer back to inpatient setting.  On his initial physical examination his blood pressure was 103/72, HR 101, RR 20 and temp 97.7, lungs with bibasilar rhonchi, heart with S1 and S2 present and irregular, abdomen with no distention and no lower extremity edema.    Na 124, K 3,9 Cl 88, bicarbonate 24, glucose 115 bun 40 cr 7,42 Wbc 14.3 hgb 7.7 plt 377  TSH 8,9    Chest radiograph with cardiomegaly, bilateral interstitial infiltrates symmetric and central, bilateral hilar vascular congestion. HD catheter in the left internal jugular vein with tip in the SVC.    EKG 135 bpm, normal axis, normal intervals, sinus rhythm with 1st degree AV block and PAC, no significant ST segment or T wave changes.    7/18 - Readmitted to [facility] for Afib RVR/SOB requiring BiPAP, inability to complete HD. 7/22 - Off of BiPAP, tolerating Salter. Remains anxious. Tolerating CRRT. 7/24 patient on sinus rhythm and off CRRT.   07/25 transferred to [facility].   08/04 patient with improvement in volume status, tolerating renal replacement therapy with ultrafiltration.  Has been placed on midodrine for blood pressure support.   Pending transfer back to CIR to continue physical therapy.  8/8 continue inpatient renal replacement therapy 8/13 pending transfer to CIR     Assessment & Plan:   Principal Problem:   Multifocal atrial tachycardia Active Problems:   CAP (community acquired pneumonia)   ESRD on dialysis (HCC)   Acute and subacute infective endocarditis in diseases classified elsewhere   Hyperlipidemia   Hypertension   Moderate protein malnutrition (HCC)   Osteomyelitis (HCC)   Hypotension  #1 multifocal atrial tachycardia -Noted to have required diltiazem infusion earlier in the hospitalization and has subsequently been transitioned to metoprolol. -Patient in normal sinus rhythm. -Patient seen in consultation by cardiology and note from 10/09/2022 concluded no atrial fibrillation noted and also noted with first-degree AV block. -Patient noted to have been on diltiazem and subsequently metoprolol for rate control. -Due to hypotension, metoprolol has been discontinued. -Currently in normal sinus rhythm. -Supportive care.  2.  Acute hypoxemic respiratory failure secondary to CAP -Patient improved clinically with oxygenation. -Currently with sats of 96% on room air. -Status post full course of cefazolin which was completed 11/04/2022 which patient was on for coverage for MSSA bacteremia and discitis and osteomyelitis.  -Continue bronchodilators, supportive care.  3.  ESRD -On HD. -Per nephrology.  4.  Hyponatremia -Likely secondary to hypervolemic hyponatremia. -Sodium on 10/27/2022 noted at 119, BUN of 66 with anion gap of 7. -Patient noted to have weight up of 2 kg from 10/25/2022. -Patient undergoing HD treatments per nephrology recommendations.   -Sodium levels improving currently at 127.   -Per nephrology.  5.  MSSA bacteremia/acute and subacute infective endocarditis -Patient with noted MSSA bacteremia with embolization to brain and lungs during prior hospitalization. -Status post full  course of IV cefazolin with end date 11/04/2022 per ID recommendations. -Supportive care.  6.  L4 S1 discitis and osteomyelitis -Patient completed a course of IV cephalosporin on 11/04/2022 per ID recommendations from prior hospitalization.   -Outpatient follow-up with ID.    7.  Hyperlipidemia -Statin on hold..  8.  Hypertension/hypotension -Patient noted to be hypotensive with systolic blood pressures of 96-1 02. -Midodrine dose increased to 20 mg 3 times daily and metoprolol discontinued.  9.  Volume overload -Patient noted to have hypotension which was preventing adequate UF during HD. -Patient required transfer to the ICU for CRRT 7/21-7/24. -Metoprolol subsequently discontinued due to soft blood pressure.  -Currently on midodrine with dose increased to 20 mg 3 times daily to aid with blood pressure with hemodialysis. -Per nephrology.  10.  Debility -PT/OT. -Awaiting CIR placement.   11.  Moderate protein calorie malnutrition -Nutritional supplementation.   12.  Cough -?  May be secondary to volume overload. -Continue PPI.    DVT prophylaxis: Heparin Code Status: Full Family Communication: Updated patient.  Disposition: CIR versus home with home health.  Status is: Inpatient Remains inpatient appropriate because: Severity of illness.   Consultants:  Cardiology: Dr. Wyline Mood 10/09/2022 PCCM: Dr. Merrily Pew 10/12/2022 Nephrology  Procedures:  Significant Hospital Events: Including procedures, antibiotic start and stop dates in addition to other pertinent events   6/16 - 7/4 [facility] admission for L5-S1 osteomyelitis, MSSA bacteremia 7/4 - Admitted to CIR 7/18 - Readmitted to [facility] for Afib RVR/SOB requiring BiPAP, inability to complete HD. 7/22 - Off of BiPAP, tolerating Salter. Remains anxious. Tolerating CRRT. 7/24 BP down-trending overnight, transitioned off CRRT     Antimicrobials:  Anti-infectives (From admission, onward)    Start     Dose/Rate Route Frequency  Ordered Stop   10/21/22 1800  ceFAZolin (ANCEF) IVPB 2g/100 mL premix        2 g 200 mL/hr over 30 Minutes Intravenous Every T-Th-Sa (1800) 10/21/22 1154 11/04/22 1900   10/16/22 2000  ceFAZolin (ANCEF) IVPB 1 g/50 mL premix  Status:  Discontinued        1 g 100 mL/hr over 30 Minutes Intravenous Every 24 hours 10/15/22 1355 10/21/22 1154   10/12/22 1800  ceFAZolin (ANCEF) IVPB 2g/100 mL premix  Status:  Discontinued        2 g 200 mL/hr over 30 Minutes Intravenous Every 12 hours 10/12/22 1557 10/15/22 1355   10/09/22 1800  ceFAZolin (ANCEF) IVPB 2g/100 mL premix  Status:  Discontinued        2 g 200 mL/hr over 30 Minutes Intravenous Every T-Th-Sa (1800) 10/09/22 1619 10/12/22 1557         Subjective: Sitting up in bed.  Denies any chest pain.  No significant shortness of breath.  No abdominal pain.  Tolerating current diet  Objective: Vitals:   11/05/22 0352 11/05/22 0735 11/05/22 0750 11/05/22 1657  BP:      Pulse:   (!) 102 99  Resp: 19  17 15   Temp: 97.7 F (36.5 C)  97.6 F (36.4 C) 97.6 F (36.4 C)  TempSrc: Oral  Axillary Oral  SpO2:  96%    Weight: 71.5 kg     Height:        Intake/Output Summary (Last 24 hours) at 11/05/2022 2116 Last data filed at 11/04/2022 2135 Gross per  24 hour  Intake 480 ml  Output --  Net 480 ml   Filed Weights   11/04/22 0811 11/04/22 1216 11/05/22 0352  Weight: (S) 73.5 kg (S) 71 kg 71.5 kg    Examination:  General exam: NAD. Respiratory system: Some bibasilar crackles.  No wheezing.  No rhonchi.  Fair air movement.  Speaking in full sentences.  Cardiovascular system: Regular rate rhythm no murmurs rubs or gallops.  No JVD.  No lower extremity edema.  Gastrointestinal system: Abdomen is soft, nontender, nondistended, positive bowel sounds.  No rebound.  No guarding.   Central nervous system: Alert and oriented.  Moving extremities spontaneously.  No focal neurological deficits.   Extremities: Symmetric 5 x 5 power. Skin: No  rashes, lesions or ulcers Psychiatry: Judgement and insight appear normal. Mood & affect appropriate.     Data Reviewed: I have personally reviewed following labs and imaging studies  CBC: Recent Labs  Lab 11/04/22 0034  WBC 9.3  NEUTROABS 6.3  HGB 9.4*  HCT 31.0*  MCV 81.8  PLT 280    Basic Metabolic Panel: Recent Labs  Lab 10/31/22 0522 11/01/22 0348 11/02/22 0202 11/03/22 0002 11/04/22 0034 11/05/22 0049  NA 126* 126* 126* 127* 125* 127*  K 3.5 3.5 3.3* 3.4* 3.7 3.8  CL 86* 87* 90* 88* 87* 89*  CO2 22 18* 19* 19* 17* 21*  GLUCOSE 116* 97 107* 92 143* 121*  BUN 35* 54* 28* 48* 68* 39*  CREATININE 4.36* 5.70* 4.04* 5.40* 6.91* 4.73*  CALCIUM 9.2 9.0 8.7* 9.2 9.2 9.2  MG 1.6* 1.9 2.4  --   --   --   PHOS 4.6 6.4* 4.7* 6.5* 7.3* 4.6    GFR: Estimated Creatinine Clearance: 16.4 mL/min (A) (by C-G formula based on SCr of 4.73 mg/dL (H)).  Liver Function Tests: Recent Labs  Lab 11/01/22 0348 11/02/22 0202 11/03/22 0002 11/04/22 0034 11/05/22 0049  ALBUMIN 2.8* 3.1* 3.2* 3.1* 3.2*    CBG: No results for input(s): "GLUCAP" in the last 168 hours.   No results found for this or any previous visit (from the past 240 hour(s)).       Radiology Studies: No results found.      Scheduled Meds:  (feeding supplement) PROSource Plus  30 mL Oral TID WC   arformoterol  15 mcg Nebulization BID   Chlorhexidine Gluconate Cloth  6 each Topical Q0600   Darbepoetin Alfa  150 mcg Subcutaneous Q Thu-1800   feeding supplement (NEPRO CARB STEADY)  237 mL Oral TID BM   heparin  5,000 Units Subcutaneous Q8H   midodrine  20 mg Oral TID WC   multivitamin  1 tablet Oral QHS   pantoprazole  40 mg Oral Daily   revefenacin  175 mcg Nebulization Daily   Continuous Infusions:  sodium chloride 10 mL/hr at 10/18/22 2141     LOS: 27 days    Time spent: 35 minutes    Ramiro Harvest, MD [facility]   To contact the attending provider between 7A-7P or the  covering provider during after hours 7P-7A, please log into the web site [email or web address] and access using universal [place] password for that web site. If you do not have the password, please call the hospital operator.  11/05/2022, 9:16 PM

## 2022-11-05 NOTE — TOC Progression Note (Addendum)
Transition of Care [facility]) - Progression Note    Patient Details  Name: Ryan [name] MRN: [id number] Date of Birth: [birth year]  Transition of Care [facility]) CM/SW Contact  Delilah Shan, LCSWA Phone Number: 11/05/2022, 4:33 PM  Clinical Narrative:     CSW spoke with patient who gave CSW permission to schedule his HD transportation. CSW called [facility] and scheduled HD transportation on 8/17,8/20, and 8/22. [facility] will pick up patient  up from his home address and transport to [facility] [place] clinic in [place] to arrive at 5:00am for his 5:20am chair time. [facility] will pick him up from clinic between 9:43am and 10:13am to transport him back home. [facility] informed CSW that patient will need to call ahead to set up transportation for dates after 8/22. CSW informed patient. All questions answered. No further questions reported at this time.  Expected Discharge Plan: Home w Home Health Services Barriers to Discharge: No Barriers Identified  Expected Discharge Plan and Services In-house Referral: Clinical Social Work Discharge Planning Services: CM Consult Post Acute Care Choice: Home Health Living arrangements for the past 2 months: Single Family Home                           HH Arranged: RN, Disease Management, OT, PT HH Agency: [facility] Date [facility] Agency Contacted: 11/05/22 Time HH Agency Contacted: 1626 Representative spoke with at [facility] Agency: Amy   Social Determinants of Health (SDOH) Interventions SDOH Screenings   Food Insecurity: No Food Insecurity (10/09/2022)  Housing: Low Risk  (10/09/2022)  Transportation Needs: No Transportation Needs (10/09/2022)  Utilities: Not At Risk (10/09/2022)  Financial Resource Strain: Medium Risk (05/16/2022)   Received from [facility], [facility]  Tobacco Use: Low Risk  (10/09/2022)    Readmission Risk Interventions    09/10/2022    1:53 PM  Readmission Risk Prevention Plan  Transportation Screening  Complete  PCP or Specialist Appt within 5-7 Days Complete  Home Care Screening Complete  Medication Review (RN CM) Referral to Pharmacy

## 2022-11-05 NOTE — Progress Notes (Signed)
Occupational Therapy Treatment Patient Details Name: Ryan [name] MRN: [id number] DOB: Dec 30, 1960 Today's Date: 11/05/2022   History of present illness 62 yo male presents to [facility] from CIR on 7/18 for afib RVR, aspiration PNA. RR on 7/21 for acute respiratory distress due to pulmonary edema requiring bipap and CRRT, and transfer to ICU. 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 S/p  HD cath L subclavian on 7/1. PMH includes HTN, HLD, ESRD on HD TTS.   OT comments  Patient completed supine to sit EOB with  mod I with HOB elevated.  Patient completed UB grooming with min A to bathe underarms 2/2 impaired UE AROM.  Patient completed LB bathing with min A for washing feet 2/2 patient reporting that he can't cross his legs to gain access nor bend over that far.  Patient would benefit from [facility] to increase independence in task.  Patient completed UB dressing at seated level with  in A to don clean hospital gown.  Patient completed sit to stand from EOB with CGA and completed functional mobility to sink with RW with CGA, patient completed UB grooming with CGA/supervision.  Patient returned to sitting EOB and left with all needs within reach.  Patient is progressing towards goals and would benefit from additional OT services to address remaining deficits      If plan is discharge home, recommend the following:  Assistance with cooking/housework;Help with stairs or ramp for entrance;Assist for transportation;Direct supervision/assist for medications management;Direct supervision/assist for financial management;A little help with walking and/or transfers;A little help with bathing/dressing/bathroom   Equipment Recommendations  Tub/shower bench;BSC/3in1    Recommendations for Other Services      Precautions / Restrictions Precautions Precautions: Fall Required Braces or  Orthoses: Spinal Brace Spinal Brace: Lumbar corset Spinal Brace Comments: for comfort Restrictions Weight Bearing Restrictions: No       Mobility Bed Mobility Overal bed mobility: Modified Independent       Supine to sit: Modified independent (Device/Increase time)          Transfers Overall transfer level: Needs assistance Equipment used: Rolling walker (2 wheels) Transfers: Sit to/from Stand Sit to Stand: Contact guard assist                 Balance Overall balance assessment: Needs assistance Sitting-balance support: No upper extremity supported, Feet supported Sitting balance-Leahy Scale: Good Sitting balance - Comments: sitting EOB                                   ADL either performed or assessed with clinical judgement   ADL Overall ADL's : Needs assistance/impaired     Grooming: Wash/dry hands;Wash/dry face;Oral care;Set up;Supervision/safety;Standing   Upper Body Bathing: Sitting;Minimal assistance Upper Body Bathing Details (indicate cue type and reason):  (assist for washing underneath arms 2/2 decreased b/l ROM) Lower Body Bathing: Minimal assistance (sitting, for washing feet)   Upper Body Dressing : Minimal assistance;Sitting Upper Body Dressing Details (indicate cue type and reason): to don hospital gown Lower Body Dressing: Moderate assistance Lower Body Dressing Details (indicate cue type and reason):  (patient reporting that he currently not cross 1 LE over the other to don socks.  States he was able to do so but now he cant)             Functional mobility during ADLs:  Contact guard assist;Rolling walker (2 wheels)      Extremity/Trunk Assessment Upper Extremity Assessment RUE Deficits / Details: FF to 90 degrees due to rotator cuff tear, generalized weakness RUE Sensation: WNL RUE Coordination: WNL LUE Deficits / Details: generalized weakness LUE Sensation: WNL LUE Coordination: WNL            Vision        Perception     Praxis      Cognition Arousal: Alert Behavior During Therapy: WFL for tasks assessed/performed Overall Cognitive Status: Within Functional Limits for tasks assessed Area of Impairment: Awareness, Problem solving, Memory, Safety/judgement, Following commands                                        Exercises      Shoulder Instructions       General Comments      Pertinent Vitals/ Pain       Pain Assessment Pain Assessment: 0-10 Pain Score: 3  Faces Pain Scale: Hurts a little bit Breathing: normal Negative Vocalization: none Facial Expression: smiling or inexpressive Body Language: relaxed Consolability: no need to console PAINAD Score: 0 Pain Location:  (low back) Pain Descriptors / Indicators: Sore Pain Intervention(s): Monitored during session  Home Living                                          Prior Functioning/Environment              Frequency  Min 1X/week        Progress Toward Goals  OT Goals(current goals can now be found in the care plan section)  Progress towards OT goals: Progressing toward goals  Acute Rehab OT Goals OT Goal Formulation: With patient Time For Goal Achievement: 11/12/22 Potential to Achieve Goals: Good ADL Goals Pt Will Perform Grooming: standing;with supervision Pt Will Perform Lower Body Bathing: with supervision;sit to/from stand Pt Will Perform Upper Body Dressing: with modified independence;sitting Pt Will Perform Lower Body Dressing: with supervision;sit to/from stand Pt Will Transfer to Toilet: with supervision;ambulating;regular height toilet Pt Will Perform Toileting - Clothing Manipulation and hygiene: with supervision;sit to/from stand Pt/caregiver will Perform Home Exercise Program: Increased strength;Both right and left upper extremity;With theraband;With written HEP provided Additional ADL Goal #1: Pt will generalize energy conservation strategies in  ADLs. Additional ADL Goal #2: Patient will demonstrate ability to successfully sequance and complete a 3 step therapeutic or functional activity requiring alternating attention with Mod I while sitting EOB for 5 or more minutes with Fair balance.  Plan Discharge plan remains appropriate    Co-evaluation                 AM-PAC OT "6 Clicks" Daily Activity     Outcome Measure   Help from another person eating meals?: None Help from another person taking care of personal grooming?: A Little Help from another person toileting, which includes using toliet, bedpan, or urinal?: A Little Help from another person bathing (including washing, rinsing, drying)?: A Little Help from another person to put on and taking off regular upper body clothing?: A Little Help from another person to put on and taking off regular lower body clothing?: A Little 6 Click Score: 19    End of Session Equipment Utilized During Treatment: Gait belt  OT Visit Diagnosis: Unsteadiness on feet (R26.81)   Activity Tolerance Patient tolerated treatment well   Patient Left in bed;with call bell/phone within reach   Nurse Communication Mobility status        Time: 9528-4132 OT Time Calculation (min): 38 min  Charges: OT General Charges $OT Visit: 1 Visit OT Treatments $Self Care/Home Management : 38-52 mins Governor Specking OT/L  Denice Paradise 11/05/2022, 2:09 PM

## 2022-11-05 NOTE — Progress Notes (Signed)
Physical Therapy Treatment Patient Details Name: Ryan [name] MRN: [id number] DOB: 1961/01/16 Today's Date: 11/05/2022   History of Present Illness 62 yo male presents to [facility] from CIR on 7/18 for afib RVR, aspiration PNA. RR on 7/21 for acute respiratory distress due to pulmonary edema requiring bipap and CRRT, and transfer to ICU. 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 S/p  HD cath L subclavian on 7/1. PMH includes HTN, HLD, ESRD on HD TTS.    PT Comments  Pt admitted with above diagnosis. Pt was able to ambulate with contact guard assist with RW.  Pt has made good progress toward goals and noted that AIR was denied.  Discussed equipment needs with pt and explored his other options for therapy and pt is leaning toward going home with HHPT f/u with family's assist. Will practice stairs in am.   Pt currently with functional limitations due to the deficits listed below (see PT Problem List). Pt will benefit from acute skilled PT to increase their independence and safety with mobility to allow discharge.       If plan is discharge home, recommend the following: Help with stairs or ramp for entrance;Assist for transportation;Assistance with cooking/housework;A little help with walking and/or transfers;A little help with bathing/dressing/bathroom   Can travel by private vehicle        Equipment Recommendations  Rollator (4 wheels);BSC/3in1;Wheelchair (18x16 llightweight wheelchair with desk armrests, anti-tippers and foot rests);Wheelchair cushion (18x16 pressure relieving cushion) (tub bench, pt requesting hospital bed; issued gait belt)    Recommendations for Other Services       Precautions / Restrictions Precautions Precautions: Fall Precaution Comments: watch for hypotension Required Braces or Orthoses: Spinal Brace Spinal Brace: Lumbar corset Spinal Brace  Comments: for comfort Restrictions Weight Bearing Restrictions: No     Mobility  Bed Mobility Overal bed mobility: Modified Independent       Supine to sit: Modified independent (Device/Increase time)          Transfers Overall transfer level: Needs assistance Equipment used: Rolling walker (2 wheels) Transfers: Sit to/from Stand Sit to Stand: Contact guard assist           General transfer comment: cues for hand placement when rising/sitting    Ambulation/Gait Ambulation/Gait assistance: Contact guard assist Gait Distance (Feet): 230 Feet Assistive device: Rolling walker (2 wheels) Gait Pattern/deviations: Step-through pattern, Decreased stride length Gait velocity: decr Gait velocity interpretation: <1.31 ft/sec, indicative of household ambulator Pre-gait activities: Pt tolerated standing marches, forward backward stepping with increased time and steadying assist General Gait Details: cues for upright posture, relaxing shoulder girdle, close guard for safety.  No LOB with min challenges   Stairs             Wheelchair Mobility     Tilt Bed    Modified Rankin (Stroke Patients Only)       Balance Overall balance assessment: Needs assistance Sitting-balance support: No upper extremity supported, Feet supported Sitting balance-Leahy Scale: Good Sitting balance - Comments: sitting EOB   Standing balance support: Bilateral upper extremity supported, During functional activity Standing balance-Leahy Scale: Poor Standing balance comment: reliant on RW                            Cognition Arousal: Alert Behavior During Therapy: WFL for tasks assessed/performed Overall Cognitive Status: Within Functional Limits for tasks assessed Area  of Impairment: Awareness, Problem solving, Memory, Safety/judgement, Following commands                   Current Attention Level: Sustained Memory: Decreased short-term memory Following Commands:  Follows one step commands with increased time Safety/Judgement: Decreased awareness of safety Awareness: Emergent Problem Solving: Requires verbal cues, Slow processing General Comments: motivated, engaged        Exercises General Exercises - Lower Extremity Long Arc Quad: AROM, Both, 15 reps, Seated Hip Flexion/Marching: AROM, Both, 15 reps, Seated    General Comments General comments (skin integrity, edema, etc.): VSS with O2 sats on RA 99%.      Pertinent Vitals/Pain Pain Assessment Pain Assessment: 0-10 Pain Score: 2  Faces Pain Scale: Hurts a little bit Breathing: normal Negative Vocalization: none Facial Expression: smiling or inexpressive Body Language: relaxed Consolability: no need to console PAINAD Score: 0 Pain Location:  (low back) Pain Descriptors / Indicators: Sore Pain Intervention(s): Limited activity within patient's tolerance, Monitored during session, Repositioned    Home Living                          Prior Function            PT Goals (current goals can now be found in the care plan section) Acute Rehab PT Goals Patient Stated Goal: back to rehab to get stronger Progress towards PT goals: Progressing toward goals    Frequency    Min 1X/week      PT Plan Current plan remains appropriate    Co-evaluation              AM-PAC PT "6 Clicks" Mobility   Outcome Measure  Help needed turning from your back to your side while in a flat bed without using bedrails?: A Little Help needed moving from lying on your back to sitting on the side of a flat bed without using bedrails?: A Little Help needed moving to and from a bed to a chair (including a wheelchair)?: A Little Help needed standing up from a chair using your arms (e.g., wheelchair or bedside chair)?: A Little Help needed to walk in hospital room?: A Little Help needed climbing 3-5 steps with a railing? : A Little 6 Click Score: 18    End of Session Equipment  Utilized During Treatment: Back brace;Gait belt Activity Tolerance: Patient tolerated treatment well Patient left: in chair;with call bell/phone within reach;with family/visitor present (pt's friend present) Nurse Communication: Mobility status PT Visit Diagnosis: Other abnormalities of gait and mobility (R26.89);Muscle weakness (generalized) (M62.81) Pain - Right/Left:  (back) Pain - part of body:  (back)     Time: 1914-7829 PT Time Calculation (min) (ACUTE ONLY): 31 min  Charges:    $Gait Training: 23-37 mins PT General Charges $$ ACUTE PT VISIT: 1 Visit                      M,PT Acute Rehab Services [phone]    Bevelyn Buckles 11/05/2022, 2:32 PM

## 2022-11-05 NOTE — Progress Notes (Signed)
[facility] Progress Note   Subjective:    Seen and examined patient at bedside. Tolerated yesterday's HD with net UF 2.5L. Next HD 8/15. Pending transfer to CIR.  Objective Vitals:   11/04/22 2110 11/05/22 0352 11/05/22 0735 11/05/22 0750  BP:      Pulse: 94   (!) 102  Resp: 20 19  17   Temp:  97.7 F (36.5 C)  97.6 F (36.4 C)  TempSrc:  Oral  Axillary  SpO2: 96%  96%   Weight:  71.5 kg    Height:       Physical Exam General: Alert male in NAD Heart: RRR, no murmurs, rubs or gallops Lungs: CTA bilaterally, on RA Abdomen: Soft, non-distended, +BS Extremities: No edema b/l lower extremities Dialysis Access: [facility]  Filed Weights   11/04/22 0811 11/04/22 1216 11/05/22 0352  Weight: (S) 73.5 kg (S) 71 kg 71.5 kg    Intake/Output Summary (Last 24 hours) at 11/05/2022 1244 Last data filed at 11/04/2022 2135 Gross per 24 hour  Intake 480 ml  Output --  Net 480 ml    Additional Objective Labs: Basic Metabolic Panel: Recent Labs  Lab 11/03/22 0002 11/04/22 0034 11/05/22 0049  NA 127* 125* 127*  K 3.4* 3.7 3.8  CL 88* 87* 89*  CO2 19* 17* 21*  GLUCOSE 92 143* 121*  BUN 48* 68* 39*  CREATININE 5.40* 6.91* 4.73*  CALCIUM 9.2 9.2 9.2  PHOS 6.5* 7.3* 4.6   Liver Function Tests: Recent Labs  Lab 11/03/22 0002 11/04/22 0034 11/05/22 0049  ALBUMIN 3.2* 3.1* 3.2*   No results for input(s): "LIPASE", "AMYLASE" in the last 168 hours. CBC: Recent Labs  Lab 11/04/22 0034  WBC 9.3  NEUTROABS 6.3  HGB 9.4*  HCT 31.0*  MCV 81.8  PLT 280   Blood Culture    Component Value Date/Time   SDES BLOOD BLOOD RIGHT ARM 10/05/2022 2032   SPECREQUEST  10/05/2022 2032    BOTTLES DRAWN AEROBIC AND ANAEROBIC Blood Culture adequate volume   CULT  10/05/2022 2032    NO GROWTH 5 DAYS Performed at [facility], [address] [place], [place] [address]    REPTSTATUS 10/10/2022 FINAL 10/05/2022 2032    Cardiac Enzymes: No results for input(s):  "CKTOTAL", "CKMB", "CKMBINDEX", "TROPONINI" in the last 168 hours. CBG: No results for input(s): "GLUCAP" in the last 168 hours. Iron Studies: No results for input(s): "IRON", "TIBC", "TRANSFERRIN", "FERRITIN" in the last 72 hours. Lab Results  Component Value Date   INR 1.4 (H) 10/10/2022   INR 1.4 (H) 09/10/2022   INR 1.13 09/23/2017   Studies/Results: No results found.  Medications:  sodium chloride 10 mL/hr at 10/18/22 2141    (feeding supplement) PROSource Plus  30 mL Oral TID WC   arformoterol  15 mcg Nebulization BID   Chlorhexidine Gluconate Cloth  6 each Topical Q0600   Darbepoetin Alfa  150 mcg Subcutaneous Q Thu-1800   feeding supplement (NEPRO CARB STEADY)  237 mL Oral TID BM   heparin  5,000 Units Subcutaneous Q8H   midodrine  20 mg Oral TID WC   multivitamin  1 tablet Oral QHS   pantoprazole  40 mg Oral Daily   revefenacin  175 mcg Nebulization Daily    Dialysis Orders: TTS at [facility] 3:45hr, 450/A1.5, EDW 85.7kg, 2K/2Ca bath, TDC, heparin 3000 unit bolus - no ESA - Calcitriol 1.65mcg PO q HD  Assessment/Plan: MSSA bacteremia/endocarditis/L5-S1 discitis: Blood Cx 6/16 MSSA, negative Cx on 6/17,  6/19. S/p TDC line holiday. TTE was concerning for endocarditis. TEE cancelled d/t high risk factors. LS MRI with possible L5-S1 discitis. On IV Cefazolin 2g q HD x 6 weeks (until 11/04/22). 2. Hyponatremia: Likely 2nd volume overload. Na level improving, now 127.  3. BP/Volume overload: Hypotension preventing adequate UF. Required ICU transfer for CRRT 7/21 - 10/15/22. Reinforced fluid restrictions. Metoprolol stopped and Midodrine raised to 20mg  TID-okay to give an extra 20mg  mid-run during HD if needed. Current EDW ranging 71-73.5kg, need to keep his EDW low. 4. Deconditioning [name]: was in CIR, was transferred back to 6E due to  medical complications. Hoping for transfer back to CIR soon 5. ESRD: See above, continue HD on TTS schedule. Next hD 11/06/22.. 6. Access: TDC  pulled 6/18 for line holiday, then new LIJ TDC placed 7/01 per IR. For perm access: s/p L AVF ligation in 2019. Last seen by Dr. Edilia Bo 01/2022 - limited mobility in R arm so planning for L arm graft but had not scheduled surgery yet. Can revisit as an outpatient once bacteremia resolved.  7. Anemia (ESRD + ABLA, + FOBT): Hgb 9.4. Continue Aranesp q Thursday.   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 Phos fluctuating, not on binders for now 9. Nutrition - On dysphagia diet and protein supplements   Salome Holmes, NP [facility] 11/05/2022,12:44 PM  LOS: 27 days

## 2022-11-05 NOTE — Progress Notes (Addendum)
Case discussed with CSW. Pt receives out-pt HD at [facility] on TTS 5:00 am arrival for 5:20 am chair time. Clinic checking to see how pt is transported to out-pt HD appts. Update provided to CSW. Will assist as needed.   Olivia Canter Renal Navigator [phone]  Addendum at 6:30 pm: Update provided to clinic that CSW has made transportation appts for pt's next 3 out-pt HD treatments. Clinic advised that pt may d/c on Friday (per CSW) to home.

## 2022-11-05 NOTE — Plan of Care (Signed)
  Problem: Education: Goal: Knowledge of General Education information will improve Description: Including pain rating scale, medication(s)/side effects and non-pharmacologic comfort measures Outcome: Progressing   Problem: Clinical Measurements: Goal: Ability to maintain clinical measurements within normal limits will improve Outcome: Progressing Goal: Will remain free from infection Outcome: Progressing Goal: Cardiovascular complication will be avoided Outcome: Progressing   Problem: Activity: Goal: Risk for activity intolerance will decrease Outcome: Progressing   Problem: Nutrition: Goal: Adequate nutrition will be maintained Outcome: Progressing   Problem: Elimination: Goal: Will not experience complications related to bowel motility Outcome: Progressing   Problem: Safety: Goal: Ability to remain free from injury will improve Outcome: Progressing

## 2022-11-05 NOTE — Progress Notes (Signed)
Mobility Specialist Progress Note:    11/05/22 1204  Mobility  Activity Ambulated with assistance in hallway  Level of Assistance Contact guard assist, steadying assist  Assistive Device Front wheel walker  Distance Ambulated (ft) 230 ft  Activity Response Tolerated well  Mobility Referral Yes  $Mobility charge 1 Mobility  Mobility Specialist Start Time (ACUTE ONLY) 1113  Mobility Specialist Stop Time (ACUTE ONLY) 1127  Mobility Specialist Time Calculation (min) (ACUTE ONLY) 14 min   Received pt in bed having no complaints and agreeable to mobility. Pt was asymptomatic throughout ambulation and returned to room w/o fault. Left sitting EOB w/ call bell in reach and all needs met.   Thompson Grayer Mobility Specialist  Please contact vis Secure Chat or  Rehab Office [phone]

## 2022-11-05 NOTE — Progress Notes (Signed)
Inpatient Rehab Admissions Coordinator:   I received a denial from [facility] for pt to return to CIR and complete program.  Attempted to reach pt via room phone to discuss options but no answer.  Will retry later.   Estill Dooms, PT, DPT Admissions Coordinator [phone] 11/05/22  11:07 AM

## 2022-11-05 NOTE — TOC Progression Note (Signed)
Transition of Care [facility]) - Progression Note    Patient Details  Name: Ryan [name] MRN: [id number] Date of Birth: October 10, 1960  [facility] CM/SW Contact  Graves-Bigelow, Lamar Laundry, RN Phone Number: 11/05/2022, 4:26 PM  Clinical Narrative:  Case Manager received notification that the patients insurance denied CIR. Patient is agreeable to home with home health services. Patient is currently home alone however, he states his son will be staying with him post hospitalization. Patient does not have an agency preference for [facility] PT/OT/RN; he just wants an agency in network. Case Manager did call [facility] and they can accept the patient for services. Patient has HD TTS and the CSW will discuss with the patient regarding transportation to HD. DME needed WC, tub bench, rollator. Patient is agreeable to DME via [employer]. Case Manager discussed hospital bed and the home therapist will assess to see if the patient needs once he gets home. Case Manager will continue to follow for transition of care needs as the patient progresses.     Expected Discharge Plan: Home w Home Health Services Barriers to Discharge: No Barriers Identified  Expected Discharge Plan and Services In-house Referral: Clinical Social Work Discharge Planning Services: CM Consult Post Acute Care Choice: Home Health Living arrangements for the past 2 months: Single Family Home     HH Arranged: RN, Disease Management, OT, PT HH Agency: [facility] Date [facility] Agency Contacted: 11/05/22 Time HH Agency Contacted: 1626 Representative spoke with at [facility] Agency: Amy   Social Determinants of Health (SDOH) Interventions SDOH Screenings   Food Insecurity: No Food Insecurity (10/09/2022)  Housing: Low Risk  (10/09/2022)  Transportation Needs: No Transportation Needs (10/09/2022)  Utilities: Not At Risk (10/09/2022)  Financial Resource Strain: Medium Risk (05/16/2022)   [facility]  Tobacco Use: Low Risk  (10/09/2022)    Readmission Risk Interventions    09/10/2022    1:53 PM  Readmission Risk Prevention Plan  Transportation Screening Complete  PCP or Specialist Appt within 5-7 Days Complete  Home Care Screening Complete  Medication Review (RN CM) Referral to Pharmacy

## 2022-11-06 DIAGNOSIS — I5033 Acute on chronic diastolic (congestive) heart failure: Secondary | ICD-10-CM | POA: Diagnosis not present

## 2022-11-06 DIAGNOSIS — T8612 Kidney transplant failure: Secondary | ICD-10-CM | POA: Diagnosis not present

## 2022-11-06 DIAGNOSIS — I10 Essential (primary) hypertension: Secondary | ICD-10-CM | POA: Diagnosis not present

## 2022-11-06 DIAGNOSIS — I2690 Septic pulmonary embolism without acute cor pulmonale: Secondary | ICD-10-CM | POA: Diagnosis not present

## 2022-11-06 DIAGNOSIS — E440 Moderate protein-calorie malnutrition: Secondary | ICD-10-CM | POA: Diagnosis not present

## 2022-11-06 DIAGNOSIS — E871 Hypo-osmolality and hyponatremia: Secondary | ICD-10-CM | POA: Diagnosis not present

## 2022-11-06 DIAGNOSIS — I4719 Other supraventricular tachycardia: Secondary | ICD-10-CM | POA: Diagnosis not present

## 2022-11-06 DIAGNOSIS — I76 Septic arterial embolism: Secondary | ICD-10-CM | POA: Diagnosis not present

## 2022-11-06 DIAGNOSIS — E8770 Fluid overload, unspecified: Secondary | ICD-10-CM | POA: Diagnosis not present

## 2022-11-06 DIAGNOSIS — M869 Osteomyelitis, unspecified: Secondary | ICD-10-CM | POA: Diagnosis not present

## 2022-11-06 DIAGNOSIS — R7881 Bacteremia: Secondary | ICD-10-CM | POA: Diagnosis not present

## 2022-11-06 DIAGNOSIS — I9589 Other hypotension: Secondary | ICD-10-CM | POA: Diagnosis not present

## 2022-11-06 DIAGNOSIS — J9601 Acute respiratory failure with hypoxia: Secondary | ICD-10-CM | POA: Diagnosis not present

## 2022-11-06 DIAGNOSIS — I959 Hypotension, unspecified: Secondary | ICD-10-CM | POA: Diagnosis not present

## 2022-11-06 DIAGNOSIS — J189 Pneumonia, unspecified organism: Secondary | ICD-10-CM | POA: Diagnosis not present

## 2022-11-06 DIAGNOSIS — I39 Endocarditis and heart valve disorders in diseases classified elsewhere: Secondary | ICD-10-CM | POA: Diagnosis not present

## 2022-11-06 DIAGNOSIS — N186 End stage renal disease: Secondary | ICD-10-CM | POA: Diagnosis not present

## 2022-11-06 DIAGNOSIS — I330 Acute and subacute infective endocarditis: Secondary | ICD-10-CM | POA: Diagnosis not present

## 2022-11-06 DIAGNOSIS — I953 Hypotension of hemodialysis: Secondary | ICD-10-CM | POA: Diagnosis not present

## 2022-11-06 DIAGNOSIS — E785 Hyperlipidemia, unspecified: Secondary | ICD-10-CM | POA: Diagnosis not present

## 2022-11-06 LAB — RENAL FUNCTION PANEL
Albumin: 3.2 g/dL — ABNORMAL LOW (ref 3.5–5.0)
Anion gap: 20 — ABNORMAL HIGH (ref 5–15)
BUN: 61 mg/dL — ABNORMAL HIGH (ref 8–23)
CO2: 19 mmol/L — ABNORMAL LOW (ref 22–32)
Calcium: 9.3 mg/dL (ref 8.9–10.3)
Chloride: 86 mmol/L — ABNORMAL LOW (ref 98–111)
Creatinine, Ser: 6.65 mg/dL — ABNORMAL HIGH (ref 0.61–1.24)
GFR, Estimated: 9 mL/min — ABNORMAL LOW (ref >60)
Glucose, Bld: 100 mg/dL — ABNORMAL HIGH (ref 70–99)
Phosphorus: 5.6 mg/dL — ABNORMAL HIGH (ref 2.5–4.6)
Potassium: 3.9 mmol/L (ref 3.5–5.1)
Sodium: 125 mmol/L — ABNORMAL LOW (ref 135–145)

## 2022-11-06 LAB — CBC
HCT: 33.4 % — ABNORMAL LOW (ref 39.0–52.0)
Hemoglobin: 10 g/dL — ABNORMAL LOW (ref 13.0–17.0)
MCH: 24.6 pg — ABNORMAL LOW (ref 26.0–34.0)
MCHC: 29.9 g/dL — ABNORMAL LOW (ref 30.0–36.0)
MCV: 82.3 fL (ref 80.0–100.0)
Platelets: 251 10*3/uL (ref 150–400)
RBC: 4.06 MIL/uL — ABNORMAL LOW (ref 4.22–5.81)
RDW: 21.3 % — ABNORMAL HIGH (ref 11.5–15.5)
WBC: 9.9 10*3/uL (ref 4.0–10.5)
nRBC: 1.7 % — ABNORMAL HIGH (ref 0.0–0.2)

## 2022-11-06 LAB — HEPATITIS B SURFACE ANTIGEN: Hepatitis B Surface Ag: NONREACTIVE

## 2022-11-06 MED ORDER — ALTEPLASE 2 MG IJ SOLR: 2.0000 mg | Freq: Once | INTRAMUSCULAR | Status: DC | PRN

## 2022-11-06 MED ORDER — ATORVASTATIN CALCIUM 40 MG PO TABS
40.0000 mg | ORAL_TABLET | Freq: Every day | ORAL | Status: DC
  Administered 2022-11-06 – 2022-11-07 (×2): 40 mg via ORAL
  Filled 2022-11-06 (×2): qty 1

## 2022-11-06 MED ORDER — HEPARIN SODIUM (PORCINE) 1000 UNIT/ML IJ SOLN
INTRAMUSCULAR | Status: AC
  Administered 2022-11-06: 3000 [IU] via INTRAVENOUS
  Filled 2022-11-06: qty 3

## 2022-11-06 MED ORDER — HEPARIN SODIUM (PORCINE) 1000 UNIT/ML DIALYSIS: 1000.0000 [IU] | INTRAMUSCULAR | Status: DC | PRN

## 2022-11-06 MED ORDER — HEPARIN SODIUM (PORCINE) 1000 UNIT/ML IJ SOLN: 3000.0000 [IU] | Freq: Once | INTRAMUSCULAR | Status: AC

## 2022-11-06 MED ORDER — METOPROLOL TARTRATE 5 MG/5ML IV SOLN: 2.5000 mg | INTRAVENOUS | Status: DC

## 2022-11-06 MED ORDER — HEPARIN SODIUM (PORCINE) 1000 UNIT/ML IJ SOLN
INTRAMUSCULAR | Status: AC
  Administered 2022-11-06: 3800 [IU] via INTRAVENOUS_CENTRAL
  Filled 2022-11-06: qty 4

## 2022-11-06 MED ORDER — SODIUM CHLORIDE 0.9 % IV BOLUS: 250.0000 mL | Freq: Once | INTRAVENOUS | Status: DC

## 2022-11-06 MED ORDER — METOPROLOL TARTRATE 5 MG/5ML IV SOLN: 5.0000 mg | INTRAVENOUS | Status: DC

## 2022-11-06 NOTE — Progress Notes (Signed)
Patient is refusing of IV fluid bolus. Education was given. MD was notified. Will continue to monitor blood pressure.

## 2022-11-06 NOTE — Procedures (Signed)
HD Note:  Some information was entered later than the data was gathered due to patient care needs. The stated time with the data is accurate.  Received patient in chair to unit.   Alert and oriented.   Informed consent signed and in chart.   TX duration: 3.5 hours  Patient tolerated treatment well.   Transported back to the room   Alert, without acute distress.   Access used: Left upper chest Access issues: arterial pressures were not WDL.  Flushing x2, reversed lines, then lowered BFR to 300.  Treatment was able to move forward.  Total UF removed: 2500 ml  Patient tolerated well.  Hand-off given to patient's nurse.    L. Dareen Piano, RN Kidney Dialysis Unit.

## 2022-11-06 NOTE — Plan of Care (Signed)

## 2022-11-06 NOTE — Progress Notes (Signed)
   11/06/22 0606  Assess: MEWS Score  BP 93/69  MAP (mmHg) 77  ECG Heart Rate (!) 103  Resp 18  Level of Consciousness Alert  SpO2 96 %  O2 Device Room Air  Patient Activity (if Appropriate) In bed  Assess: MEWS Score  MEWS Temp 0  MEWS Systolic 1  MEWS Pulse 1  MEWS RR 0  MEWS LOC 0  MEWS Score 2  MEWS Score Color Yellow  Assess: if the MEWS score is Yellow or Red  Were vital signs accurate and taken at a resting state? Yes  Does the patient meet 2 or more of the SIRS criteria? No  Does the patient have a confirmed or suspected source of infection? No  MEWS guidelines implemented  No, previously yellow, continue vital signs every 4 hours  Assess: SIRS CRITERIA  SIRS Temperature  0  SIRS Pulse 1  SIRS Respirations  0  SIRS WBC 0  SIRS Score Sum  1

## 2022-11-06 NOTE — Progress Notes (Signed)
Pt is having episodes of nonsustained Vtach. He denies any chest pain, SOB, dizziness but does feel palpitations at times. Vitals are HR 102, BP 96/64, RR 19. Notified MD and made orders. Obtained EKG.

## 2022-11-06 NOTE — Plan of Care (Signed)
  Problem: Clinical Measurements: Goal: Will remain free from infection Outcome: Progressing Goal: Diagnostic test results will improve Outcome: Progressing Goal: Respiratory complications will improve Outcome: Progressing   Problem: Nutrition: Goal: Adequate nutrition will be maintained Outcome: Progressing   Problem: Safety: Goal: Ability to remain free from injury will improve Outcome: Progressing   Problem: Skin Integrity: Goal: Risk for impaired skin integrity will decrease Outcome: Progressing

## 2022-11-06 NOTE — Progress Notes (Signed)
Physical Therapy Treatment Patient Details Name: Ryan [name] MRN: [id number] DOB: [birth year] Today's Date: 11/06/2022   History of Present Illness 62 yo male presents to [facility] from CIR on 7/18 for afib RVR, aspiration PNA. RR on 7/21 for acute respiratory distress due to pulmonary edema requiring bipap and CRRT, and transfer to ICU. 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 S/p  HD cath L subclavian on 7/1. PMH includes HTN, HLD, ESRD on HD TTS.    PT Comments  Pt received sitting EOB and agreeable to session. Session focused on stair trial with pt able to perform. Pt requires cues for sequencing and reports fatigue, however is able to tolerate a flight of stairs. Pt reports weakness, however demonstrates no overt instability or LOB. Education provided on safe guarding and support positions for caregiver at home. Discussed home set up, family support, and dialysis access with pt to ensure safety and confidence with mobility. Anticipate pt and family will be able to manage pt's mobility needs at home when medically ready for discharge.   If plan is discharge home, recommend the following: Help with stairs or ramp for entrance;Assist for transportation;Assistance with cooking/housework;A little help with walking and/or transfers;A little help with bathing/dressing/bathroom   Can travel by private vehicle        Equipment Recommendations  Rollator (4 wheels);BSC/3in1;Wheelchair (measurements PT);Wheelchair cushion (measurements PT)    Recommendations for Other Services       Precautions / Restrictions Precautions Precautions: Fall Precaution Comments: watch for hypotension Required Braces or Orthoses: Spinal Brace Spinal Brace: Lumbar corset Spinal Brace Comments: for comfort Restrictions Weight Bearing Restrictions: No     Mobility  Bed Mobility Overal bed  mobility: Modified Independent Bed Mobility: Sit to Supine, Supine to Sit     Supine to sit: Modified independent (Device/Increase time) Sit to supine: Modified independent (Device/Increase time)        Transfers Overall transfer level: Needs assistance Equipment used: Rolling walker (2 wheels) Transfers: Sit to/from Stand, Bed to chair/wheelchair/BSC Sit to Stand: Supervision   Step pivot transfers: Supervision       General transfer comment: Pt with increased difficulty wtih STS at times requiring 2 attempts, but pt is able to perform without assist. Pt able to step from bed <> chair with supervision for safety    Ambulation/Gait               General Gait Details: deferred for stair trial   Stairs Stairs: Yes Stairs assistance: Contact guard assist Stair Management: One rail Right Number of Stairs: 12 General stair comments: Pt able to perform with close min guard and L HHA with pt only lightly using for support. Pt ascending forward and descending backwards with cues for sequencing. No overt LOB or instability       Balance Overall balance assessment: Needs assistance Sitting-balance support: No upper extremity supported, Feet supported Sitting balance-Leahy Scale: Good Sitting balance - Comments: sitting EOB   Standing balance support: During functional activity, No upper extremity supported Standing balance-Leahy Scale: Fair Standing balance comment: Able to transfer without UE support                            Cognition Arousal: Alert Behavior During Therapy: WFL for tasks assessed/performed Overall Cognitive Status: Within Functional Limits for tasks assessed  Exercises      General Comments        Pertinent Vitals/Pain Pain Assessment Pain Assessment: Faces Faces Pain Scale: Hurts a little bit Pain Location: back Pain Descriptors / Indicators: Sore Pain  Intervention(s): Monitored during session, Repositioned     PT Goals (current goals can now be found in the care plan section) Acute Rehab PT Goals Patient Stated Goal: back to rehab to get stronger PT Goal Formulation: With patient Time For Goal Achievement: 11/17/22 Potential to Achieve Goals: Good Progress towards PT goals: Progressing toward goals    Frequency    Min 1X/week      PT Plan Current plan remains appropriate       AM-PAC PT "6 Clicks" Mobility   Outcome Measure  Help needed turning from your back to your side while in a flat bed without using bedrails?: None Help needed moving from lying on your back to sitting on the side of a flat bed without using bedrails?: None Help needed moving to and from a bed to a chair (including a wheelchair)?: A Little Help needed standing up from a chair using your arms (e.g., wheelchair or bedside chair)?: A Little Help needed to walk in hospital room?: A Little Help needed climbing 3-5 steps with a railing? : A Little 6 Click Score: 20    End of Session Equipment Utilized During Treatment: Back brace;Gait belt Activity Tolerance: Patient tolerated treatment well;Patient limited by fatigue Patient left: in bed;with call bell/phone within reach Nurse Communication: Mobility status PT Visit Diagnosis: Other abnormalities of gait and mobility (R26.89);Muscle weakness (generalized) (M62.81)     Time: 1308-6578 PT Time Calculation (min) (ACUTE ONLY): 42 min  Charges:    $Gait Training: 23-37 mins $Self Care/Home Management: 8-22 PT General Charges $$ ACUTE PT VISIT: 1 Visit                     Johny Shock, PTA Acute Rehabilitation Services Secure Chat Preferred  [phone]    Johny Shock 11/06/2022, 4:07 PM

## 2022-11-06 NOTE — Progress Notes (Signed)
[facility] Progress Note   Subjective:    Seen and examined patient on HD. Tolerating UFG 2.5L. BP is 106/75. Hopeful he can be transferred back to CIR soon.  Objective Vitals:   11/06/22 0803 11/06/22 0830 11/06/22 0900 11/06/22 0930  BP: 97/74 91/74 108/76 109/76  Pulse: 100 (!) 105 (!) 103 100  Resp: 19 20 20 20   Temp:      TempSrc:      SpO2: 98% 98% (!) 89% 99%  Weight:      Height:       Physical Exam General: Alert male in NAD Heart: RRR, no murmurs, rubs or gallops Lungs: CTA bilaterally, on RA Abdomen: Soft, non-distended, +BS Extremities: No edema b/l lower extremities Dialysis Access: [facility]  Filed Weights   11/04/22 1216 11/05/22 0352 11/06/22 0435  Weight: (S) 71 kg 71.5 kg 72.1 kg    Intake/Output Summary (Last 24 hours) at 11/06/2022 0946 Last data filed at 11/05/2022 2030 Gross per 24 hour  Intake 300 ml  Output --  Net 300 ml    Additional Objective Labs: Basic Metabolic Panel: Recent Labs  Lab 11/04/22 0034 11/05/22 0049 11/06/22 0654  NA 125* 127* 125*  K 3.7 3.8 3.9  CL 87* 89* 86*  CO2 17* 21* 19*  GLUCOSE 143* 121* 100*  BUN 68* 39* 61*  CREATININE 6.91* 4.73* 6.65*  CALCIUM 9.2 9.2 9.3  PHOS 7.3* 4.6 5.6*   Liver Function Tests: Recent Labs  Lab 11/04/22 0034 11/05/22 0049 11/06/22 0654  ALBUMIN 3.1* 3.2* 3.2*   No results for input(s): "LIPASE", "AMYLASE" in the last 168 hours. CBC: Recent Labs  Lab 11/04/22 0034 11/06/22 0654  WBC 9.3 9.9  NEUTROABS 6.3  --   HGB 9.4* 10.0*  HCT 31.0* 33.4*  MCV 81.8 82.3  PLT 280 251   Blood Culture    Component Value Date/Time   SDES BLOOD BLOOD RIGHT ARM 10/05/2022 2032   SPECREQUEST  10/05/2022 2032    BOTTLES DRAWN AEROBIC AND ANAEROBIC Blood Culture adequate volume   CULT  10/05/2022 2032    NO GROWTH 5 DAYS Performed at [facility], [address], [place], [place] [address]    REPTSTATUS 10/10/2022 FINAL 10/05/2022 2032    Cardiac  Enzymes: No results for input(s): "CKTOTAL", "CKMB", "CKMBINDEX", "TROPONINI" in the last 168 hours. CBG: No results for input(s): "GLUCAP" in the last 168 hours. Iron Studies: No results for input(s): "IRON", "TIBC", "TRANSFERRIN", "FERRITIN" in the last 72 hours. Lab Results  Component Value Date   INR 1.4 (H) 10/10/2022   INR 1.4 (H) 09/10/2022   INR 1.13 09/23/2017   Studies/Results: No results found.  Medications:  sodium chloride 10 mL/hr at 10/18/22 2141   sodium chloride      (feeding supplement) PROSource Plus  30 mL Oral TID WC   arformoterol  15 mcg Nebulization BID   Chlorhexidine Gluconate Cloth  6 each Topical Q0600   Darbepoetin Alfa  150 mcg Subcutaneous Q Thu-1800   feeding supplement (NEPRO CARB STEADY)  237 mL Oral TID BM   heparin  5,000 Units Subcutaneous Q8H   metoprolol tartrate  2.5 mg Intravenous STAT   midodrine  20 mg Oral TID WC   multivitamin  1 tablet Oral QHS   pantoprazole  40 mg Oral Daily   revefenacin  175 mcg Nebulization Daily    Dialysis Orders: TTS at [facility] 3:45hr, 450/A1.5, EDW 85.7kg, 2K/2Ca bath, TDC, heparin 3000 unit bolus - no  ESA - Calcitriol 1.20mcg PO q HD  Assessment/Plan:  MSSA bacteremia/endocarditis/L5-S1 discitis: Blood Cx 6/16 MSSA, negative Cx on 6/17, 6/19. S/p TDC line holiday. TTE was concerning for endocarditis. TEE cancelled d/t high risk factors. LS MRI with possible L5-S1 discitis. On IV Cefazolin 2g q HD x 6 weeks (until 11/04/22). 2. Hyponatremia: Likely 2nd volume overload. Na now in the 120s.  3. BP/Volume overload: Hypotension preventing adequate UF. Required ICU transfer for CRRT 7/21 - 10/15/22. Reinforced fluid restrictions. Metoprolol stopped and Midodrine raised to 20mg  TID-okay to give an extra 20mg  mid-run during HD if needed. Current EDW ranging 71-73.5kg, need to keep his EDW low. 4. Deconditioning [name]: was in CIR, was transferred back to 6E due to  medical complications. Hoping for transfer back to  CIR soon 5. ESRD: See above, continue HD on TTS schedule. On HD. 6. Access: TDC pulled 6/18 for line holiday, then new LIJ TDC placed 7/01 per IR. For perm access: s/p L AVF ligation in 2019. Last seen by Dr. Edilia Bo 01/2022 - limited mobility in R arm so planning for L arm graft but had not scheduled surgery yet. Can revisit as an outpatient once bacteremia resolved.  7. Anemia (ESRD + ABLA, + FOBT): Hgb 10. Continue Aranesp q Thursday.   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 Phos fluctuating, not on binders for now 9. Nutrition - On dysphagia diet and protein supplements   Salome Holmes, NP [facility] 11/06/2022,9:46 AM  LOS: 28 days

## 2022-11-06 NOTE — Progress Notes (Signed)
Update provided to CSW regarding pt's off time at out-pt HD clinic so transportation pick-up from clinic can be scheduled accordingly. Clinic also requesting a new Hep B surface antigen in order for pt to resume at clinic. Contacted renal NP to request that lab be ordered so it can be provided to clinic. Will assist as needed.   Olivia Canter Renal Navigator [phone]

## 2022-11-06 NOTE — Procedures (Signed)
I was present at this dialysis session. I have reviewed the session itself and made appropriate changes.   Filed Weights   11/04/22 1216 11/05/22 0352 11/06/22 0435  Weight: (S) 71 kg 71.5 kg 72.1 kg    Recent Labs  Lab 11/06/22 0654  NA 125*  K 3.9  CL 86*  CO2 19*  GLUCOSE 100*  BUN 61*  CREATININE 6.65*  CALCIUM 9.3  PHOS 5.6*    Recent Labs  Lab 11/04/22 0034 11/06/22 0654  WBC 9.3 9.9  NEUTROABS 6.3  --   HGB 9.4* 10.0*  HCT 31.0* 33.4*  MCV 81.8 82.3  PLT 280 251    Scheduled Meds:  (feeding supplement) PROSource Plus  30 mL Oral TID WC   arformoterol  15 mcg Nebulization BID   Chlorhexidine Gluconate Cloth  6 each Topical Q0600   Darbepoetin Alfa  150 mcg Subcutaneous Q Thu-1800   feeding supplement (NEPRO CARB STEADY)  237 mL Oral TID BM   heparin  5,000 Units Subcutaneous Q8H   metoprolol tartrate  2.5 mg Intravenous STAT   midodrine  20 mg Oral TID WC   multivitamin  1 tablet Oral QHS   pantoprazole  40 mg Oral Daily   revefenacin  175 mcg Nebulization Daily   Continuous Infusions:  sodium chloride 10 mL/hr at 10/18/22 2141   sodium chloride     PRN Meds:.sodium chloride, acetaminophen **OR** acetaminophen, alteplase, guaiFENesin, heparin, ipratropium-albuterol, loratadine, ondansetron **OR** ondansetron (ZOFRAN) IV, mouth rinse, oxyCODONE, phenol, senna-docusate, traZODone   Louie Bun,  MD 11/06/2022, 10:11 AM

## 2022-11-06 NOTE — Progress Notes (Signed)
Occupational Therapy Treatment Patient Details Name: Ryan [name] MRN: [id number] DOB: Oct 21, 1960 Today's Date: 11/06/2022   History of present illness 62 yo male presents to [facility] from CIR on 7/18 for afib RVR, aspiration PNA. RR on 7/21 for acute respiratory distress due to pulmonary edema requiring bipap and CRRT, and transfer to ICU. 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 S/p  HD cath L subclavian on 7/1. PMH includes HTN, HLD, ESRD on HD TTS.   OT comments  Patient sitting on EOB upon arrival into room and patient educated on DME for LB dressing including sock aid, reacher and LHS to address independence with with LB ADLs.  Patient verbalized understanding and will continue to address LB ADLs with patient using DME to further increase Independence with task. Patient educated on EC techniques. Patient completed sit to supine with Supervision.  Patient left with all needs within reach.      If plan is discharge home, recommend the following:  Assistance with cooking/housework;Help with stairs or ramp for entrance;Assist for transportation;Direct supervision/assist for medications management;Direct supervision/assist for financial management;A little help with walking and/or transfers;A little help with bathing/dressing/bathroom   Equipment Recommendations  Tub/shower bench;BSC/3in1    Recommendations for Other Services Rehab consult    Precautions / Restrictions Precautions Precautions: Fall Precaution Comments: watch for hypotension Required Braces or Orthoses: Spinal Brace Spinal Brace: Lumbar corset Spinal Brace Comments: for comfort Restrictions Weight Bearing Restrictions: No       Mobility Bed Mobility Overal bed mobility: Modified Independent Bed Mobility: Sit to Supine       Sit to supine: Supervision        Transfers                          Balance                                           ADL either performed or assessed with clinical judgement   ADL Overall ADL's : Needs assistance/impaired Eating/Feeding: Independent           Lower Body Bathing: Minimal assistance Lower Body Bathing Details (indicate cue type and reason):  (patient educated on AE for LB bathing/dressing with patient also educated on making sure to complete ADL task when patient has increased energy levels)                            Extremity/Trunk Assessment Upper Extremity Assessment Upper Extremity Assessment: Generalized weakness;RUE deficits/detail;LUE deficits/detail RUE Deficits / Details: FF to 90 degrees due to rotator cuff tear, generalized weakness            Vision       Perception     Praxis      Cognition Arousal: Alert Behavior During Therapy: WFL for tasks assessed/performed Overall Cognitive Status: Within Functional Limits for tasks assessed                                          Exercises      Shoulder Instructions       General Comments      Pertinent Vitals/  Pain       Pain Assessment Pain Assessment: 0-10 Pain Score: 2  Faces Pain Scale: Hurts a little bit Pain Location:  (back) Pain Descriptors / Indicators: Sore Pain Intervention(s): Monitored during session  Home Living                                          Prior Functioning/Environment              Frequency  Min 1X/week        Progress Toward Goals  OT Goals(current goals can now be found in the care plan section)  Progress towards OT goals: Progressing toward goals  Acute Rehab OT Goals OT Goal Formulation: With patient Time For Goal Achievement: 11/12/22 Potential to Achieve Goals: Good ADL Goals Pt Will Perform Grooming: standing;with supervision Pt Will Perform Lower Body Bathing: with supervision;sit to/from stand Pt Will  Perform Upper Body Dressing: with modified independence;sitting Pt Will Perform Lower Body Dressing: with supervision;sit to/from stand Pt Will Transfer to Toilet: with supervision;ambulating;regular height toilet Pt Will Perform Toileting - Clothing Manipulation and hygiene: with supervision;sit to/from stand Pt/caregiver will Perform Home Exercise Program: Increased strength;Both right and left upper extremity;With theraband;With written HEP provided Additional ADL Goal #1: Pt will generalize energy conservation strategies in ADLs. Additional ADL Goal #2: Patient will demonstrate ability to successfully sequance and complete a 3 step therapeutic or functional activity requiring alternating attention with Mod I while sitting EOB for 5 or more minutes with Fair balance.  Plan Equipment recommendations need to be updated (reports that he needs w/c)    Co-evaluation                 AM-PAC OT "6 Clicks" Daily Activity     Outcome Measure   Help from another person eating meals?: None Help from another person taking care of personal grooming?: A Little Help from another person toileting, which includes using toliet, bedpan, or urinal?: A Little Help from another person bathing (including washing, rinsing, drying)?: A Little Help from another person to put on and taking off regular upper body clothing?: A Little Help from another person to put on and taking off regular lower body clothing?: A Little 6 Click Score: 19    End of Session    OT Visit Diagnosis: Unsteadiness on feet (R26.81)   Activity Tolerance Patient tolerated treatment well   Patient Left in bed;with call bell/phone within reach   Nurse Communication Mobility status        Time: 1610-9604 OT Time Calculation (min): 20 min  Charges: OT General Charges $OT Visit: 1 Visit OT Treatments $Self Care/Home Management : 8-22 mins  Governor Specking OT/L  Denice Paradise 11/06/2022, 2:24 PM

## 2022-11-06 NOTE — TOC Progression Note (Addendum)
Transition of Care [facility]) - Progression Note    Patient Details  Name: Ryan [name] MRN: [id number] Date of Birth: 1960/07/28  [facility] CM/SW Contact  Graves-Bigelow, Lamar Laundry, RN Phone Number: 11/06/2022, 10:15 AM  Clinical Narrative: Case Manager placed orders for DME: rollator, wheelchair, and tub bench in Epic for the patient. Patient will not be able to get both the rollator and the wheelchair together. [employer] will call the patient to determine which one he will benefit from the most. HH has been arranged with [facility]. Case Manager will continue to follow for additional transition of care needs.   1440 11-06-22 Patient is agreeable to the rollator and not the wheelchair. DME will be delivered to the room.   Expected Discharge Plan: Home w Home Health Services Barriers to Discharge: No Barriers Identified  Expected Discharge Plan and Services In-house Referral: Clinical Social Work Discharge Planning Services: CM Consult Post Acute Care Choice: Home Health Living arrangements for the past 2 months: Single Family Home                 DME Arranged: Walker rolling with seat, Lightweight manual wheelchair with seat cushion, Tub bench DME Agency: [employer] Date DME Agency Contacted: 11/06/22 Time DME Agency Contacted: 1014 Representative spoke with at DME Agency: Vaughan Basta HH Arranged: RN, Disease Management, OT, PT HH Agency: [facility] Date [facility] Agency Contacted: 11/05/22 Time HH Agency Contacted: 1626 Representative spoke with at [facility] Agency: Amy  Social Determinants of Health (SDOH) Interventions SDOH Screenings   Food Insecurity: No Food Insecurity (10/09/2022)  Housing: Low Risk  (10/09/2022)  Transportation Needs: No Transportation Needs (10/09/2022)  Utilities: Not At Risk (10/09/2022)  Financial Resource Strain: Medium Risk (05/16/2022)   Received from [facility]  Tobacco Use: Low  Risk  (10/09/2022)   Readmission Risk Interventions    09/10/2022    1:53 PM  Readmission Risk Prevention Plan  Transportation Screening Complete  PCP or Specialist Appt within 5-7 Days Complete  Home Care Screening Complete  Medication Review (RN CM) Referral to Pharmacy

## 2022-11-06 NOTE — Progress Notes (Signed)
PROGRESS NOTE    Ryan [name]  ZOX:[id number] DOB: 1960-04-24 DOA: 10/09/2022 PCP: Sharin Grave, MD    No chief complaint on file.   Brief Narrative:  Ryan [name] was admitted to the hospital with the working diagnosis of acute hypoxemic respiratory failure due to pneumonia in the setting of endocarditis with septic emboli.    62 yo male with the past medical history of hypertension, ESRD, and hyperlipidemia who was transferred from CIR to inpatient setting due to suspected new onset atrial fibrillation with RVR. Recent hospitalization 06/16 to 07/04 for mitral valve endocarditis, MSSA bacteremia with brain septic emboli.  One week after his discharge while in CIR he was diagnosed with pneumonia, and worsening anemia. On 07/18 he developed suspected atrial fibrillation prompting his transfer back to inpatient setting.  On his initial physical examination his blood pressure was 103/72, HR 101, RR 20 and temp 97.7, lungs with bibasilar rhonchi, heart with S1 and S2 present and irregular, abdomen with no distention and no lower extremity edema.    Na 124, K 3,9 Cl 88, bicarbonate 24, glucose 115 bun 40 cr 7,42 Wbc 14.3 hgb 7.7 plt 377  TSH 8,9    Chest radiograph with cardiomegaly, bilateral interstitial infiltrates symmetric and central, bilateral hilar vascular congestion. HD catheter in the left internal jugular vein with tip in the SVC.    EKG 135 bpm, normal axis, normal intervals, sinus rhythm with 1st degree AV block and PAC, no significant ST segment or T wave changes.    7/18 - Readmitted to [facility] for Afib RVR/SOB requiring BiPAP, inability to complete HD. 7/22 - Off of BiPAP, tolerating Salter. Remains anxious. Tolerating CRRT. 7/24 patient on sinus rhythm and off CRRT.   07/25 transferred to [facility].   08/04 patient with improvement in volume status, tolerating renal replacement therapy with ultrafiltration.  Has been placed on midodrine for blood pressure support.   Pending transfer back to CIR to continue physical therapy.  8/8 continue inpatient renal replacement therapy 8/13 pending transfer to CIR     Assessment & Plan:   Principal Problem:   Multifocal atrial tachycardia Active Problems:   CAP (community acquired pneumonia)   ESRD on dialysis (HCC)   Acute and subacute infective endocarditis in diseases classified elsewhere   Hyperlipidemia   Hypertension   Moderate protein malnutrition (HCC)   Osteomyelitis (HCC)   Hypotension  #1 multifocal atrial tachycardia -Noted to have required diltiazem infusion earlier in the hospitalization and has subsequently been transitioned to metoprolol. -Patient in normal sinus rhythm. -Patient seen in consultation by cardiology and note from 10/09/2022 concluded no atrial fibrillation noted and also noted with first-degree AV block. -Patient noted to have been on diltiazem and subsequently metoprolol for rate control. -Due to hypotension, metoprolol has been discontinued. -Currently in normal sinus rhythm. -Supportive care.  2.  Acute hypoxemic respiratory failure secondary to CAP -Patient improved clinically with oxygenation. -Currently with sats of 97% on room air. -Status post full course of cefazolin which was completed 11/04/2022 which patient was on for coverage for MSSA bacteremia and discitis and osteomyelitis.  -Continue bronchodilators, supportive care.  3.  ESRD -On HD. -Per nephrology.  4.  Hyponatremia -Likely secondary to hypervolemic hyponatremia. -Sodium on 10/27/2022 noted at 119, BUN of 66 with anion gap of 7. -Patient noted to have weight up of 2 kg from 10/25/2022. -Patient undergoing HD treatments per nephrology recommendations.   -Sodium levels improving currently at 125.   -Per nephrology.  5.  MSSA bacteremia/acute and subacute infective endocarditis -Patient with noted MSSA bacteremia with embolization to brain and lungs during prior hospitalization. -Status post full  course of IV cefazolin with end date 11/04/2022 per ID recommendations. -Supportive care.  6.  L4 S1 discitis and osteomyelitis -Patient completed a course of IV cephalosporin on 11/04/2022 per ID recommendations from prior hospitalization.   -Outpatient follow-up with ID.    7.  Hyperlipidemia -Resume statin.  8.  Hypertension/hypotension -Patient noted to be hypotensive with systolic blood pressures of 96-102. -Continue increase midodrine dose of 20 mg 3 times daily.  -Metoprolol discontinued.   9.  Volume overload -Patient noted to have hypotension which was preventing adequate UF during HD. -Patient required transfer to the ICU for CRRT 7/21-7/24. -Metoprolol subsequently discontinued due to soft blood pressure.  -Currently on midodrine with dose increased to 20 mg 3 times daily to aid with blood pressure with hemodialysis. -Per nephrology.  10.  Debility -PT/OT. -Was awaiting CIR placement however got denied and as such will be discharged home with home health.    11.  Moderate protein calorie malnutrition -Nutritional supplementation.   12.  Cough -?  May be secondary to volume overload. -PPI.     DVT prophylaxis: Heparin Code Status: Full Family Communication: Updated patient.  Disposition: CIR versus home with home health.  Status is: Inpatient Remains inpatient appropriate because: Severity of illness.   Consultants:  Cardiology: Dr. Wyline Mood 10/09/2022 PCCM: Dr. Merrily Pew 10/12/2022 Nephrology  Procedures:  Significant Hospital Events: Including procedures, antibiotic start and stop dates in addition to other pertinent events   6/16 - 7/4 [name] admission for L5-S1 osteomyelitis, MSSA bacteremia 7/4 - Admitted to CIR 7/18 - Readmitted to [facility] for Afib RVR/SOB requiring BiPAP, inability to complete HD. 7/22 - Off of BiPAP, tolerating Salter. Remains anxious. Tolerating CRRT. 7/24 BP down-trending overnight, transitioned off CRRT     Antimicrobials:   Anti-infectives (From admission, onward)    Start     Dose/Rate Route Frequency Ordered Stop   10/21/22 1800  ceFAZolin (ANCEF) IVPB 2g/100 mL premix        2 g 200 mL/hr over 30 Minutes Intravenous Every T-Th-Sa (1800) 10/21/22 1154 11/04/22 1900   10/16/22 2000  ceFAZolin (ANCEF) IVPB 1 g/50 mL premix  Status:  Discontinued        1 g 100 mL/hr over 30 Minutes Intravenous Every 24 hours 10/15/22 1355 10/21/22 1154   10/12/22 1800  ceFAZolin (ANCEF) IVPB 2g/100 mL premix  Status:  Discontinued        2 g 200 mL/hr over 30 Minutes Intravenous Every 12 hours 10/12/22 1557 10/15/22 1355   10/09/22 1800  ceFAZolin (ANCEF) IVPB 2g/100 mL premix  Status:  Discontinued        2 g 200 mL/hr over 30 Minutes Intravenous Every T-Th-Sa (1800) 10/09/22 1619 10/12/22 1557         Subjective: Sitting up in chair in hemodialysis.  Denies any chest pain, no shortness of breath, no abdominal pain.  No bleeding.   Objective: Vitals:   11/06/22 1100 11/06/22 1130 11/06/22 1151 11/06/22 1157  BP: 99/67 100/75 99/71 97/67   Pulse: 97 97 98 96  Resp: (!) 22 (!) 22 (!) 26 18  Temp:    98.1 F (36.7 C)  TempSrc:      SpO2: 96% 100% 98% 97%  Weight:      Height:        Intake/Output Summary (Last 24 hours) at 11/06/2022  1208 Last data filed at 11/06/2022 1157 Gross per 24 hour  Intake 300 ml  Output 2500 ml  Net -2200 ml   Filed Weights   11/04/22 1216 11/05/22 0352 11/06/22 0435  Weight: (S) 71 kg 71.5 kg 72.1 kg    Examination:  General exam: NAD. Respiratory system: CTAB anterior lung fields.  No wheezes, no crackles, no rhonchi.  Fair movement.  Speaking in full sentences.  Cardiovascular system: RRR no murmurs rubs or gallops.  No JVD.  No lower extremity edema.  Gastrointestinal system: Abdomen is soft, nontender, nondistended, positive bowel sounds.  No rebound.  No guarding.  Central nervous system: Alert and oriented.  Moving extremities spontaneously.  No focal neurological  deficits.   Extremities: Symmetric 5 x 5 power. Skin: No rashes, lesions or ulcers Psychiatry: Judgement and insight appear normal. Mood & affect appropriate.     Data Reviewed: I have personally reviewed following labs and imaging studies  CBC: Recent Labs  Lab 11/04/22 0034 11/06/22 0654  WBC 9.3 9.9  NEUTROABS 6.3  --   HGB 9.4* 10.0*  HCT 31.0* 33.4*  MCV 81.8 82.3  PLT 280 251    Basic Metabolic Panel: Recent Labs  Lab 10/31/22 0522 11/01/22 0348 11/02/22 0202 11/03/22 0002 11/04/22 0034 11/05/22 0049 11/06/22 0654  NA 126* 126* 126* 127* 125* 127* 125*  K 3.5 3.5 3.3* 3.4* 3.7 3.8 3.9  CL 86* 87* 90* 88* 87* 89* 86*  CO2 22 18* 19* 19* 17* 21* 19*  GLUCOSE 116* 97 107* 92 143* 121* 100*  BUN 35* 54* 28* 48* 68* 39* 61*  CREATININE 4.36* 5.70* 4.04* 5.40* 6.91* 4.73* 6.65*  CALCIUM 9.2 9.0 8.7* 9.2 9.2 9.2 9.3  MG 1.6* 1.9 2.4  --   --   --   --   PHOS 4.6 6.4* 4.7* 6.5* 7.3* 4.6 5.6*    GFR: Estimated Creatinine Clearance: 11.7 mL/min (A) (by C-G formula based on SCr of 6.65 mg/dL (H)).  Liver Function Tests: Recent Labs  Lab 11/02/22 0202 11/03/22 0002 11/04/22 0034 11/05/22 0049 11/06/22 0654  ALBUMIN 3.1* 3.2* 3.1* 3.2* 3.2*    CBG: No results for input(s): "GLUCAP" in the last 168 hours.   No results found for this or any previous visit (from the past 240 hour(s)).       Radiology Studies: No results found.      Scheduled Meds:  (feeding supplement) PROSource Plus  30 mL Oral TID WC   arformoterol  15 mcg Nebulization BID   Chlorhexidine Gluconate Cloth  6 each Topical Q0600   Darbepoetin Alfa  150 mcg Subcutaneous Q Thu-1800   feeding supplement (NEPRO CARB STEADY)  237 mL Oral TID BM   heparin  5,000 Units Subcutaneous Q8H   metoprolol tartrate  2.5 mg Intravenous STAT   midodrine  20 mg Oral TID WC   multivitamin  1 tablet Oral QHS   pantoprazole  40 mg Oral Daily   revefenacin  175 mcg Nebulization Daily    Continuous Infusions:  sodium chloride 10 mL/hr at 10/18/22 2141   sodium chloride       LOS: 28 days    Time spent: 35 minutes    Ramiro Harvest, MD [facility]   To contact the attending provider between 7A-7P or the covering provider during after hours 7P-7A, please log into the web site [email or web address] and access using universal [place] password for that web site. If you do not have the password, please  call the hospital operator.  11/06/2022, 12:08 PM

## 2022-11-07 ENCOUNTER — Other Ambulatory Visit (HOSPITAL_COMMUNITY): Payer: Self-pay

## 2022-11-07 DIAGNOSIS — B9561 Methicillin susceptible Staphylococcus aureus infection as the cause of diseases classified elsewhere: Secondary | ICD-10-CM | POA: Diagnosis not present

## 2022-11-07 DIAGNOSIS — I4719 Other supraventricular tachycardia: Secondary | ICD-10-CM | POA: Diagnosis not present

## 2022-11-07 DIAGNOSIS — E440 Moderate protein-calorie malnutrition: Secondary | ICD-10-CM | POA: Diagnosis not present

## 2022-11-07 DIAGNOSIS — I39 Endocarditis and heart valve disorders in diseases classified elsewhere: Secondary | ICD-10-CM | POA: Diagnosis not present

## 2022-11-07 DIAGNOSIS — J189 Pneumonia, unspecified organism: Secondary | ICD-10-CM | POA: Diagnosis not present

## 2022-11-07 DIAGNOSIS — R7881 Bacteremia: Secondary | ICD-10-CM | POA: Diagnosis not present

## 2022-11-07 DIAGNOSIS — I959 Hypotension, unspecified: Secondary | ICD-10-CM | POA: Diagnosis not present

## 2022-11-07 DIAGNOSIS — N186 End stage renal disease: Secondary | ICD-10-CM | POA: Diagnosis not present

## 2022-11-07 DIAGNOSIS — Z992 Dependence on renal dialysis: Secondary | ICD-10-CM | POA: Diagnosis not present

## 2022-11-07 DIAGNOSIS — E785 Hyperlipidemia, unspecified: Secondary | ICD-10-CM | POA: Diagnosis not present

## 2022-11-07 LAB — RENAL FUNCTION PANEL
Albumin: 3.2 g/dL — ABNORMAL LOW (ref 3.5–5.0)
Anion gap: 19 — ABNORMAL HIGH (ref 5–15)
BUN: 43 mg/dL — ABNORMAL HIGH (ref 8–23)
CO2: 19 mmol/L — ABNORMAL LOW (ref 22–32)
Calcium: 9.3 mg/dL (ref 8.9–10.3)
Chloride: 88 mmol/L — ABNORMAL LOW (ref 98–111)
Creatinine, Ser: 4.84 mg/dL — ABNORMAL HIGH (ref 0.61–1.24)
GFR, Estimated: 13 mL/min — ABNORMAL LOW (ref >60)
Glucose, Bld: 113 mg/dL — ABNORMAL HIGH (ref 70–99)
Phosphorus: 4.8 mg/dL — ABNORMAL HIGH (ref 2.5–4.6)
Potassium: 3.5 mmol/L (ref 3.5–5.1)
Sodium: 126 mmol/L — ABNORMAL LOW (ref 135–145)

## 2022-11-07 LAB — CBC
HCT: 34.9 % — ABNORMAL LOW (ref 39.0–52.0)
Hemoglobin: 10.5 g/dL — ABNORMAL LOW (ref 13.0–17.0)
MCH: 24.9 pg — ABNORMAL LOW (ref 26.0–34.0)
MCHC: 30.1 g/dL (ref 30.0–36.0)
MCV: 82.9 fL (ref 80.0–100.0)
Platelets: 240 10*3/uL (ref 150–400)
RBC: 4.21 MIL/uL — ABNORMAL LOW (ref 4.22–5.81)
RDW: 21.4 % — ABNORMAL HIGH (ref 11.5–15.5)
WBC: 9.8 10*3/uL (ref 4.0–10.5)
nRBC: 2.1 % — ABNORMAL HIGH (ref 0.0–0.2)

## 2022-11-07 MED ORDER — MIDODRINE HCL 10 MG PO TABS
20.0000 mg | ORAL_TABLET | Freq: Three times a day (TID) | ORAL | 1 refills | Status: AC
  Filled 2022-11-07: qty 161, 27d supply, fill #0

## 2022-11-07 MED ORDER — GUAIFENESIN 100 MG/5ML PO LIQD
5.0000 mL | ORAL | 0 refills | Status: AC | PRN
  Filled 2022-11-07: qty 118, 4d supply, fill #0

## 2022-11-07 MED ORDER — NEPRO/CARBSTEADY PO LIQD: 237.0000 mL | Freq: Three times a day (TID) | ORAL | Status: AC

## 2022-11-07 MED ORDER — TRAZODONE HCL 50 MG PO TABS
50.0000 mg | ORAL_TABLET | Freq: Every evening | ORAL | 0 refills | Status: AC | PRN
  Filled 2022-11-07: qty 15, 15d supply, fill #0

## 2022-11-07 NOTE — Plan of Care (Signed)
[facility] Patient Discharge Orders- [facility] CLINIC: [place]  Patient's name: FERRIN CREACH Admit/DC Dates: 10/09/2022 - 11/07/22  Discharge Diagnoses: MSSA bacteremia/endocarditis/discitis --Completed course of cefazolin Hypotension --  continue midodrine  Hyponatremia Debility    Aranesp:  Date and amount of last dose: 8/15 150 mcg  Last Hgb: 10.5 PRBC's Given: Yes  Date/# of units: 7/18 1 unit  ESA dose for discharge: mircera 100  mcg IV q 2 weeks  IV Iron dose at discharge: --  Heparin change: --   New EDW: 71 kg   [place] Change: 2K/2Ca   Access intervention/Change: New L internal jugular TDC placed 09/22/22 per IR   Hectorol/Calcitriol change: No change   Discharge Labs: Calcium 9.3 Phosphorus 4.8 Albumin 3.2 K+:  3.5  IV Antibiotics: No    On Coumadin?: No     OTHER/APPTS/LAB ORDERS: - Check full monthly labs when he returns  - Needs outpatient evaluation for perm access options    D/C Meds to be reconciled by nurse after every discharge.  Completed By: Tomasa Blase PA-C 11/07/2022,3:28 PM   Reviewed by: MD:______ RN_______

## 2022-11-07 NOTE — Discharge Summary (Signed)
Physician Discharge Summary  Ryan [name] FAO:[id number] DOB: [birth year] DOA: 10/09/2022  PCP: Ryan Grave, MD  Admit date: 10/09/2022 Discharge date: 11/07/2022  Time spent: 60 minutes  Recommendations for Outpatient Follow-up:  Follow-up with Ryan Grave, MD in 2 weeks. Follow-up at regular hemodialysis center as scheduled 11/08/2022 Follow-up with Dr. Thedore Mins, ID on 11/18/2022 at 9:45 AM for follow-up on endocarditis and MSSA bacteremia.   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  Diet recommendation: Heart healthy  Filed Weights   11/05/22 0352 11/06/22 0435 11/07/22 0608  Weight: 71.5 kg 72.1 kg 70.1 kg    History of present illness:  HPI per Dr. [name] is a 62 y.o. male with medical history significant of HTN, HLD, ESRD on hemodialysis being admitted from CIR for new onset atrial fibrillation with RVR.   Patient initially presented to the hospital on 6/6 with lower back pain and ultimately found to have evidence of L5/S1 discitis and osteomyelitis as well as MSSA bacteremia with mitral valve endocarditis. CT scan of the chest noted septic emboli with new airspace opacities in the left upper lobe . MRI brain noting scattered subcentimeter infarcts involving the bilateral cerebral hemisphere and right cerebellum. CT surgery has been consulted but recommended conservative management as. He was already responding to antibiotics. Plan was for patient to continue on antibiotics at cefazolin until 8/13 per ID recommendations. Hospital course had also been complicated by small bowel obstruction which has  since resolved. Patient had been discharged to inpatient rehab on 7/4.    While in rehab medicine team was consulted on 7/11 for cough and dyspnea.  Eventually patient was diagnosed with concerns of pneumonia/aspiration therefore was on 5 days of Zosyn and also diagnosed of fluid overload being managed with hemodialysis.  He also reported of dysphagia and had drop in hemoglobin of 6.1 with Hemoccult positive stool.  Patient was seen by GI, esophagram performed which was negative for any acute pathology.   While getting hemodialysis on 7/18 patient went into atrial fibrillation with RVR therefore being admitted to the hospital for closer monitoring.  Hospital Course:  #1 multifocal atrial tachycardia -Noted to have required diltiazem infusion earlier in the hospitalization and had subsequently been transitioned to metoprolol. -Patient in normal sinus rhythm. -Patient seen in consultation by cardiology and note from 10/09/2022 concluded no atrial fibrillation noted and also noted with first-degree AV block. -Patient noted to have been on diltiazem and subsequently metoprolol for rate control. -Due to hypotension, metoprolol and diltiazem were discontinued.  -Patient remained in normal sinus rhythm throughout the hospitalization.   -Outpatient follow-up with PCP.    2.  Acute hypoxemic respiratory failure secondary to CAP -Patient improved clinically with oxygenation. -Currently with sats of 97% on room air. -Status post full course of cefazolin which was completed 11/04/2022 which patient was on for coverage for MSSA bacteremia and discitis and osteomyelitis.  -Patient also maintained on bronchodilators and supportive care.   -Patient noted with sats of 100% on room air by day of discharge.  C   3.  ESRD -On HD. -Per nephrology.   4.  Hyponatremia -Likely secondary to hypervolemic hyponatremia. -  Sodium on 10/27/2022 noted at 119, BUN of 66 with anion gap of 7. -Patient noted to have weight up  of 2 kg from 10/25/2022. -Patient underwent HD treatments per nephrology during the hospitalization.  -Sodium levels seem to plateau at 126 by day of discharge.  -Patient will follow-up in outpatient HD.  -Outpatient follow-up with nephrology.    5.  MSSA bacteremia/acute and subacute infective endocarditis -Patient with noted MSSA bacteremia with embolization to brain and lungs during prior hospitalization. -Status post full course of IV cefazolin with end date 11/04/2022 per ID recommendations. -Outpatient follow-up with ID.   6.  L4 S1 discitis and osteomyelitis -Patient completed a course of IV cephalosporin on 11/04/2022 per ID recommendations from prior hospitalization.   -Outpatient follow-up with ID.     7.  Hyperlipidemia -Statin initially held during the hospitalization and subsequently resumed by day of discharge.   8.  Hypertension/hypotension -Patient noted to be hypotensive with systolic blood pressures of 96-102. -Patient noted to be on midodrine and dose increased to 20 mg 3 times daily.   -Metoprolol subsequently discontinued.   -Outpatient follow-up.     9.  Volume overload -Patient noted to have hypotension which was preventing adequate UF during HD. -Patient required transfer to the ICU for CRRT 7/21-7/24. -Metoprolol subsequently discontinued due to soft blood pressure.  -Patient noted to be on midodrine and dose increased to 20 mg 3 times daily to aid with blood pressure and with hemodialysis.  -Patient underwent hemodialysis during the hospitalization.   -Outpatient follow-up at hemodialysis center.    10.  Debility -PT/OT. -Was awaiting CIR placement however got denied and as such will be discharged home with home health.     11.  Moderate protein calorie malnutrition -Patient maintained on nutritional supplementation.    12.  Cough -?  May be secondary to volume overload. -Patient maintained on a PPI.  -Outpatient follow-up with PCP.     Procedures: Significant Hospital Events: Including procedures, antibiotic start and stop dates in addition to other pertinent events   6/16 - 7/4 [facility] admission for L5-S1 osteomyelitis, MSSA bacteremia 7/4 - Admitted to CIR 7/18 - Readmitted to [facility] for Afib RVR/SOB requiring BiPAP, inability to complete HD. 7/22 - Off of BiPAP, tolerating Salter. Remains anxious. Tolerating CRRT. 7/24 BP down-trending overnight, transitioned off CRRT  Consultations: Cardiology: Dr. Wyline Mood 10/09/2022 PCCM: Dr. Merrily Pew 10/12/2022 Nephrology  Discharge Exam: Vitals:   11/07/22 0857 11/07/22 1500  BP:  (!) 84/59  Pulse:  91  Resp:  20  Temp:  (!) 97.5 F (36.4 C)  SpO2: 100% 94%    General: NAD Cardiovascular: RRR no murmurs rubs or gallops.  No JVD.  No lower extremity edema. Respiratory: Clear to auscultation bilaterally.  No wheezes, no crackles, no rhonchi.  Fair air movement.  Speaking in full sentences.  Discharge Instructions   Discharge Instructions     Diet - low sodium heart healthy   Complete by: As directed    Increase activity slowly   Complete by: As directed    No wound care   Complete by: As directed       Allergies as of 11/07/2022       Reactions   Iodinated Contrast Media Itching, Nausea Only   Probably needs prednisone prep prior to contrast        Medication List     STOP taking these medications    atenolol 25 MG tablet Commonly known as: TENORMIN   benzonatate  100 MG capsule Commonly known as: TESSALON   ceFAZolin 2-4 GM/100ML-% IVPB Commonly known as: ANCEF   ceFAZolin IVPB Commonly known as: ANCEF   cinacalcet 30 MG tablet Commonly known as: SENSIPAR   guaiFENesin 600 MG 12 hr tablet Commonly known as: MUCINEX Replaced by: FT Tussin Adult 200 MG/10ML liquid   methocarbamol 500 MG tablet Commonly known as: ROBAXIN       TAKE these medications    acetaminophen 325 MG tablet Commonly known as: TYLENOL Take 1-2 tablets (325-650 mg  total) by mouth every 4 (four) hours as needed for mild pain.   arformoterol 15 MCG/2ML Nebu Commonly known as: BROVANA Take 2 mLs (15 mcg total) by nebulization 2 (two) times daily.   atorvastatin 40 MG tablet Commonly known as: LIPITOR Take 40 mg by mouth daily.   bisacodyl 10 MG suppository Commonly known as: DULCOLAX Place 1 suppository (10 mg total) rectally daily as needed for moderate constipation.   calcitRIOL 0.5 MCG capsule Commonly known as: ROCALTROL Take 1 capsule (0.5 mcg total) by mouth every Tuesday, Thursday, and Saturday at 6 PM.   calcium carbonate 500 MG chewable tablet Commonly known as: TUMS - dosed in mg elemental calcium Chew 1 tablet (200 mg of elemental calcium total) by mouth 4 (four) times daily as needed for indigestion or heartburn.   camphor-menthol lotion Commonly known as: SARNA Apply topically as needed for itching.   Darbepoetin Alfa 150 MCG/0.3ML Sosy injection Commonly known as: ARANESP Inject 0.3 mLs (150 mcg total) into the skin every Thursday at 6pm.   feeding supplement (NEPRO CARB STEADY) Liqd Take 237 mLs by mouth 3 (three) times daily between meals.   FT Tussin Adult 200 MG/10ML liquid Generic drug: guaiFENesin Take 5 mLs by mouth every 4 (four) hours as needed for cough or to loosen phlegm. Replaces: guaiFENesin 600 MG 12 hr tablet   ipratropium-albuterol 0.5-2.5 (3) MG/3ML Soln Commonly known as: DUONEB Take 3 mLs by nebulization every 4 (four) hours as needed.   midodrine 10 MG tablet Commonly known as: PROAMATINE Take 2 tablets (20 mg total) by mouth 3 (three) times daily with meals. What changed: how much to take   multivitamin Tabs tablet Take 1 tablet by mouth daily.   oxyCODONE HCl 7.5 MG Tabs Take 7.5-10 mg by mouth every 4 (four) hours as needed for moderate pain or severe pain.   pantoprazole 40 MG tablet Commonly known as: PROTONIX Take 1 tablet (40 mg total) by mouth daily.   revefenacin 175 MCG/3ML  nebulizer solution Commonly known as: YUPELRI Take 3 mLs (175 mcg total) by nebulization daily.   sevelamer 800 MG tablet Commonly known as: RENAGEL Take 2,400 mg by mouth 3 (three) times daily with meals.   sucroferric oxyhydroxide 500 MG chewable tablet Commonly known as: VELPHORO Chew 2 tablets (1,000 mg total) by mouth 3 (three) times daily with meals.   traZODone 50 MG tablet Commonly known as: DESYREL Take 1 tablet (50 mg total) by mouth at bedtime as needed for sleep.               Durable Medical Equipment  (From admission, onward)           Start     Ordered   11/06/22 1010  For home use only DME 4 wheeled rolling walker with seat  Once       Question:  Patient needs a walker to treat with the following condition  Answer:  General weakness  11/06/22 1012   11/06/22 1010  For home use only DME Tub bench  Once        11/06/22 1012   11/06/22 1010  For home use only DME lightweight manual wheelchair with seat cushion  Once       Comments: Patient suffers from ESRD and multifocal atrial tachycardia which impairs their ability to perform daily activities like bathing, dressing, and grooming in the home.  A rolling walker will not resolve  issue with performing activities of daily living. A wheelchair will allow patient to safely perform daily activities. Patient is not able to propel themselves in the home using a standard weight wheelchair due to general weakness. Patient can self propel in the lightweight wheelchair. Length of need lifetime.  Accessories: elevating leg rests (ELRs), wheel locks, extensions and anti-tippers.   11/06/22 1012           Allergies  Allergen Reactions   Iodinated Contrast Media Itching and Nausea Only    Probably needs prednisone prep prior to contrast    Follow-up Information     [facility] Follow up.   Why: Registered Nurse, Physical and Occupational Therapy-office to call with visit times. Contact  information: [address] [place] [address] [phone]         [facility] Follow up.   Why: Rolling walker with seat and tub bench. Contact information: Address: [address], [place], [place] [address] Phone: [phone]        Ryan Grave, MD. Schedule an appointment as soon as possible for a visit in 2 week(s).   Contact information: [address] [place] [address] [phone]         HD center as scheduled. Follow up on 11/08/2022.          Ryan Earthly, MD Follow up on 11/18/2022.   Specialty: Infectious Diseases Why: 9:45 am appt Contact information: [address] [place] [address] [phone]                  The results of significant diagnostics from this hospitalization (including imaging, microbiology, ancillary and laboratory) are listed below for reference.    Significant Diagnostic Studies: DG Swallowing Func-Speech Pathology  Result Date: 10/31/2022 Table formatting from the original result was not included. Modified Barium Swallow Study Patient Details Name: NASIAH CAPONE MRN: [id number] Date of Birth: [birth year] Today's Date: 10/31/2022 HPI/PMH: HPI: DAWSEN PURUCKER is a 61 yo male admitted from CIR 7/18 for new onset A-fib with RVR while undergoing HD. Initially presented 6/6 with lower back pain and found to have evidence of L5/S1 discitis and osteomyelitis as well as MRSA bacteremia with mitral valve endocarditis. MRI Brain 6/27 with scattered subcentimeter acute ischemic infarcts involving bilateral cerebral hemispheres and R cerebellum. Per H&P note, while in rehab medicine team consulted 7/11 for cough and dyspnea with eventual diagnosis with concerns for PNA/aspiration. MBS 7/18 with mild oral residue with good sensation to clear and an otherwise safe and efficient oropharyngeal swallow. A regular texture diet with thin liquids was recommended at that time, however, pt  requested Dys 3 textures. PMH includes anemia, ESRD, HLD, HTN Clinical Impression: Clinical Impression: Pt presents with a functional oropharyngeal swallow. With larger boluses, pt appears to swallow majority of bolus effectively, although with some residue in his oral cavity that he promptly initiates lingual motion to clear. Despite pt's sensation of oral residue and initiation of  lingual motion to propel bolus posteriorly, it then pools in the valleculae with no apparent sensation and pt required cueing to initiate subsequent swallow. However, this is effective in completely clearing pharyngeal residue. Pt thoroughly masticated solids with prompt swallow initiation and no oropharyngeal residue noted. One throat clear observed during study, which was not related to inadequate airway protection. No penetration/aspiration noted with any trials of thin liquids, nectar thick liquids, purees, or solids. When taking the pill, pt required multiple sips of thin liquids to clear from his oral cavity before gagging as it progressed through his pharynx. A subsequent spoonful of puree was effective in clearing pill from the pharynx, although with retention noted in the esophagus that required another spoonful of puree to clear. Due to pt's reports of globus sensation and gagging with meal trays, suspect esophageal component, although note esophagram with no acute concerns. Recommend pt initiate diet of regular textures with thin liquids, although pt states his preference is to continue diet of Dys 1 textures due to subjective reports that it "goes down easier". SLP provided education regarding use of multiple swallows during meals, with which he verbalized understanding. Pt able to upgrade diet as he feels comfortable with consuming more solid textures. No further SLP f/u is necessary as pt presents with functional swallow. Will s/o at this time. Factors that may increase risk of adverse event in presence of aspiration  Ryan [name] & [name] 2021): Factors that may increase risk of adverse event in presence of aspiration Ryan [name] & [name] 2021): Respiratory or GI disease Recommendations/Plan: Swallowing Evaluation Recommendations Swallowing Evaluation Recommendations Recommendations: PO diet PO Diet Recommendation: Dysphagia 1 (Pureed); Thin liquids (Level 0) Liquid Administration via: Cup; Straw Medication Administration: Whole meds with liquid (with purees PRN for larger pills) Supervision: Patient able to self-feed Swallowing strategies  : Minimize environmental distractions; Small bites/sips; Slow rate Postural changes: Stay upright 30-60 min after meals; Position pt fully upright for meals Oral care recommendations: Oral care BID (2x/day); Pt independent with oral care Treatment Plan Treatment Plan Treatment recommendations: No treatment recommended at this time Follow-up recommendations: Acute inpatient rehab (3 hours/day) (for cognitive goals) Recommendations Comment: no dysphagia intervention required, conitnue w/ current POC for cognition Functional status assessment: Patient has not had a recent decline in their functional status. Recommendations Recommendations for follow up therapy are one component of a multi-disciplinary discharge planning process, led by the attending physician.  Recommendations may be updated based on patient status, additional functional criteria and insurance authorization. Assessment: Orofacial Exam: Orofacial Exam Oral Cavity: Oral Hygiene: WFL Oral Cavity - Dentition: Adequate natural dentition Orofacial Anatomy: WFL Oral Motor/Sensory Function: WFL Anatomy: Anatomy: WFL Boluses Administered: Boluses Administered Boluses Administered: Thin liquids (Level 0); Mildly thick liquids (Level 2, nectar thick); Moderately thick liquids (Level 3, honey thick); Puree; Solid  Oral Impairment Domain: Oral Impairment Domain Lip Closure: No labial escape Tongue control during bolus hold: Cohesive bolus between  tongue to palatal seal Bolus preparation/mastication: Timely and efficient chewing and mashing Bolus transport/lingual motion: Brisk tongue motion Oral residue: Complete oral clearance Location of oral residue : N/A Initiation of pharyngeal swallow : Posterior laryngeal surface of the epiglottis  Pharyngeal Impairment Domain: Pharyngeal Impairment Domain Soft palate elevation: No bolus between soft palate (SP)/pharyngeal wall (PW) Laryngeal elevation: Complete superior movement of thyroid cartilage with complete approximation of arytenoids to epiglottic petiole Anterior hyoid excursion: Complete anterior movement Epiglottic movement: Complete inversion Laryngeal vestibule closure: Complete, no air/contrast in laryngeal vestibule Pharyngeal stripping wave : Present -  complete Pharyngeal contraction (A/P view only): N/A Pharyngoesophageal segment opening: Complete distension and complete duration, no obstruction of flow Tongue base retraction: Narrow column of contrast or air between tongue base and PPW Pharyngeal residue: Collection of residue within or on pharyngeal structures Location of pharyngeal residue: Valleculae; Pyriform sinuses  Esophageal Impairment Domain: Esophageal Impairment Domain Esophageal clearance upright position: Esophageal retention Pill: Pill Consistency administered: Thin liquids (Level 0) Thin liquids (Level 0): Impaired (see clinical impressions) Penetration/Aspiration Scale Score: Penetration/Aspiration Scale Score 1.  Material does not enter airway: Thin liquids (Level 0); Mildly thick liquids (Level 2, nectar thick); Moderately thick liquids (Level 3, honey thick); Puree; Solid; Pill Compensatory Strategies: Compensatory Strategies Compensatory strategies: Yes Straw: Effective Effective Straw: Thin liquid (Level 0)   General Information: Caregiver present: No  Diet Prior to this Study: Dysphagia 1 (pureed); Thin liquids (Level 0)   Temperature : Normal   Respiratory Status: WFL    Supplemental O2: None (Room air)   History of Recent Intubation: No  Behavior/Cognition: Alert; Cooperative; Pleasant mood Self-Feeding Abilities: Able to self-feed Baseline vocal quality/speech: Normal Volitional Cough: Able to elicit Volitional Swallow: Able to elicit Exam Limitations: No limitations Goal Planning: Prognosis for improved oropharyngeal function: Good No data recorded No data recorded Patient/Family Stated Goal: none stated Consulted and agree with results and recommendations: Patient Pain: Pain Assessment Pain Assessment: No/denies pain End of Session: Start Time:SLP Start Time (ACUTE ONLY): 0858 Stop Time: SLP Stop Time (ACUTE ONLY): 0920 Time Calculation:SLP Time Calculation (min) (ACUTE ONLY): 22 min Charges: SLP Evaluations $ SLP Speech Visit: 1 Visit SLP Evaluations $BSS Swallow: 1 Procedure $MBS Swallow: 1 Procedure SLP visit diagnosis: SLP Visit Diagnosis: Dysphagia, unspecified (R13.10) Past Medical History: Past Medical History: Diagnosis Date  Allergy   Anemia   Blood transfusion without reported diagnosis   Dialysis patient (HCC)   Tues,thurs,sat  ESRD (end stage renal disease) (HCC)   TTHS-   Family history of adverse reaction to anesthesia   father had allergic reaction with anesthesia with a dental procedure-(pt. doesn't know)  Hyperlipidemia   Hypertension  Past Surgical History: Past Surgical History: Procedure Laterality Date  A/V FISTULAGRAM Left 12/15/2016  Procedure: A/V Fistulagram - left;  Surgeon: Chuck Hint, MD;  Location: [facility] INVASIVE CV LAB;  Service: Cardiovascular;  Laterality: Left;  AV FISTULA PLACEMENT Left 08/28/2016  Procedure: LEFT UPPER  ARM ARTERIOVENOUS (AV) FISTULA CREATION;  Surgeon: Chuck Hint, MD;  Location: [facility] OR;  Service: Vascular;  Laterality: Left;  DIALYSIS/PERMA CATHETER INSERTION Right 12/21/2017  Procedure: INSERTION OF DIALYSIS CATHETER Right Internal Jugular .;  Surgeon: Sherren Kerns, MD;  Location: [facility] OR;  Service:  Vascular;  Laterality: Right;  FISTULA SUPERFICIALIZATION Left 09/23/2017  Procedure: FISTULA PLICATION LEFT ARM;  Surgeon: Sherren Kerns, MD;  Location: [facility] OR;  Service: Vascular;  Laterality: Left;  FISTULOGRAM Left 12/21/2017  Procedure: FISTULOGRAM with Balloon Angioplasty.;  Surgeon: Sherren Kerns, MD;  Location: [facility] OR;  Service: Vascular;  Laterality: Left;  HEMATOMA EVACUATION Left 08/27/2020  Procedure: EVACUATION HEMATOMA LEFT ARM;  Surgeon: Sherren Kerns, MD;  Location: [facility] OR;  Service: Vascular;  Laterality: Left;  IR FLUORO GUIDE CV LINE LEFT  09/22/2022  IR FLUORO GUIDE CV LINE RIGHT  09/12/2022  IR FLUORO GUIDE CV LINE RIGHT  09/17/2022  IR REMOVAL TUN CV CATH W/O FL  09/09/2022  IR THORACENTESIS ASP PLEURAL SPACE W/IMG GUIDE  08/22/2017  1.2 L -right-sided  IR THORACENTESIS ASP PLEURAL SPACE W/IMG GUIDE  10/19/2017  IR US GUIDE VASC ACCESS LEFT  09/22/2022  IR US GUIDE VASC ACCESS RIGHT  09/12/2022  KIDNEY TRANSPLANT    REVISON OF ARTERIOVENOUS FISTULA Left 12/21/2017  Procedure: PLICATION and Ligation of F LEFT ARM ARTERIOVENOUS FISTULA;  Surgeon: Sherren Kerns, MD;  Location: [facility] OR;  Service: Vascular;  Laterality: Left;  REVISON OF ARTERIOVENOUS FISTULA Left 07/16/2020  Procedure: EXCISION OF LEFT ARM ARTERIOVENOUS FISTULA;  Surgeon: Sherren Kerns, MD;  Location: [facility] OR;  Service: Vascular;  Laterality: Left;  stent in kidneys    dec 2017  TRANSTHORACIC ECHOCARDIOGRAM  09/22/2017   Severe LVH.  Normal function -EF 55-60%.  GRII DD.  Moderate RV dilation with mildly reduced RV function.   Fixed right coronary cusp with very mild aortic stenosis.  The myocardium has a speckled appearance --> .   Recommend cardiac MRI to evaluate for amyloid  UPPER EXTREMITY VENOGRAPHY Bilateral 06/01/2020  Procedure: UPPER EXTREMITY VENOGRAPHY;  Surgeon: Chuck Hint, MD;  Location: [facility] INVASIVE CV LAB;  Service: Cardiovascular;  Laterality: Bilateral; Gwynneth Aliment, M.A., CF-SLP Speech Language  Pathology, Acute Rehabilitation Services Secure Chat preferred [phone] 10/31/2022, 10:09 AM  DG CHEST PORT 1 VIEW  Result Date: 10/29/2022 CLINICAL DATA:  End-stage renal disease. EXAM: PORTABLE CHEST 1 VIEW COMPARISON:  Chest x-ray dated October 27, 2022 FINDINGS: Unchanged position of left central venous line. Unchanged cardiac contours. Persistent right perihilar and left basilar opacities. No large pleural effusion. No evidence of pneumothorax. IMPRESSION: 1. Persistent right perihilar opacity which may represent loculated fluid. 2. Left basilar opacity, likely due to atelectasis. Electronically Signed   By: Ryan [name] M.D.   On: 10/29/2022 08:22   DG CHEST PORT 1 VIEW  Result Date: 10/27/2022 CLINICAL DATA:  In stage renal disease EXAM: PORTABLE CHEST 1 VIEW COMPARISON:  10/23/2022 FINDINGS: Bilateral mild interstitial thickening. Prominence of the central pulmonary vasculature. No focal consolidation. No pleural effusion or pneumothorax. Stable cardiomegaly. Dual lumen left-sided central venous catheter with the tip projecting over the right atrium. No acute osseous abnormality. IMPRESSION: 1. Cardiomegaly with mild pulmonary vascular congestion. Electronically Signed   By: Ryan [name] M.D.   On: 10/27/2022 12:26   DG Chest 2 View  Result Date: 10/23/2022 CLINICAL DATA:  [id number] Pulmonary edema [id number] EXAM: CHEST - 2 VIEW COMPARISON:  October 14, 2022, September 07, 2022 FINDINGS: The cardiomediastinal silhouette is unchanged in contour.LEFT chest CVC with tip terminating in the RIGHT atrium. Dilation of pulmonary vasculature. No pneumothorax. There is a RIGHT perihilar opacity which projects posterior to the mediastinal vessels; this is new since June 16th 2024. This likely reflects loculated fluid within the minor fissure. Central vascular congestion with favored overall improvement of pulmonary edema. Gaseous distension of the stomach. Multilevel degenerative changes of the thoracic spine.  IMPRESSION: 1. There is a RIGHT perihilar opacity which projects posterior to the mediastinal vessels. This likely reflects loculated fluid within the minor fissure. Recommend attention on follow-up. 2. Central vascular congestion with favored overall improvement of pulmonary edema Electronically Signed   By: Meda Klinefelter M.D.   On: 10/23/2022 18:32   DG CHEST PORT 1 VIEW  Result Date: 10/14/2022 CLINICAL DATA:  End-stage renal disease EXAM: PORTABLE CHEST 1 VIEW COMPARISON:  Chest x-ray dated July twenty-first 2024 FINDINGS: Unchanged cardiomegaly and enlarged pulmonary arteries. Stable position of left chest wall Vas-Cath. Interval improved aeration of the right lung base. Similar left retrocardiac opacity, likely due to atelectasis. No large pleural effusion or evidence of  pneumothorax. IMPRESSION: Interval improved aeration of the right lung base, likely improved atelectasis. Similar left retrocardiac opacity, likely due to atelectasis. Electronically Signed   By: Ryan [name] M.D.   On: 10/14/2022 14:29   DG Chest Port 1 View  Result Date: 10/12/2022 CLINICAL DATA:  Acute respiratory distress. EXAM: PORTABLE CHEST 1 VIEW COMPARISON:  Chest radiograph dated 10/12/2022. FINDINGS: A left internal jugular central venous catheter tip overlies the right atrium. The heart is borderline enlarged accounting for technique. Moderate bilateral patchy interstitial and airspace opacities appear increased on the right and unchanged on the left compared to earlier exam. No pleural effusion or pneumothorax. IMPRESSION: Moderate bilateral patchy interstitial and airspace opacities appear increased on the right and unchanged on the left compared to earlier exam. Electronically Signed   By: Romona Curls M.D.   On: 10/12/2022 15:50   DG CHEST PORT 1 VIEW  Result Date: 10/12/2022 CLINICAL DATA:  10026 Shortness of breath 10026 EXAM: PORTABLE CHEST - 1 VIEW COMPARISON:  10/09/2022 FINDINGS: Stable left IJ  hemodialysis catheter. Slight improvement in the alveolar opacities seen previously, with residual airspace opacities in the right mid lung and left lung base. Heart size and mediastinal contours are within normal limits. Visualized bones unremarkable. IMPRESSION: Improving pulmonary edema. Electronically Signed   By: Ryan [name] M.D.   On: 10/12/2022 09:21   DG Swallowing Func-Speech Pathology  Result Date: 10/09/2022 Table formatting from the original result was not included. Modified Barium Swallow Study Patient Details Name: Ryan [name] MRN: [id number] Date of Birth: [birth year] Today's Date: 10/09/2022 HPI/PMH: HPI: Pt is a 62 y/o male admitted secondary to acute back pain and found to have L5-S1 discitis/osteomyelitis, SBO and MSSA bacteremia likely 2/2 line infection. Pt had HD cath replaced with rt femoral temp cath. 09/22/22 Hemodialysis Catheter Left Subclavian Double lumen Permanent (Tunneled). 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  PMH including but not limited to hypertension, hyperlipidemia, ESRD. Clinical Impression: Clinical Impression: Overall, pt presented w/ a safe and efficient oropharyngeal swallow. With large sips, oral residue w/ subsequent freespill to BOT and valleculae was noted. However, pt demonstrated excellent sensation of this residue and was consistently able to trigger the additional swallow as needed. Otherwise, adequate pharyngeal clearance noted and no airway invasion was evident throughout trials. Cough x2 noted overall, though unrelated to airway invasion. Would recommend continued diet of regular textures and thin liquids, however, pt requested Dys 3 textures. Continue with meds whole via liquids. No dypshagia intervention required, conitnue w/ current POC for cognitive tx. Factors that may increase risk of adverse event in presence of aspiration Ryan [name] & [name] 2021): Factors that may increase risk of adverse  event in presence of aspiration Ryan [name] & [name] 2021): Poor general health and/or compromised immunity; Respiratory or GI disease Recommendations/Plan: Swallowing Evaluation Recommendations Swallowing Evaluation Recommendations Recommendations: PO diet PO Diet Recommendation: Regular; Thin liquids (Level 0) Liquid Administration via: Straw; Cup Medication Administration: Whole meds with liquid Supervision: Patient able to self-feed Swallowing strategies  : Minimize environmental distractions; Small bites/sips; Slow rate Postural changes: Stay upright 30-60 min after meals; Position pt fully upright for meals Oral care recommendations: Oral care BID (2x/day); Pt independent with oral care Treatment Plan Treatment Plan Treatment recommendations: Therapy as outlined in treatment plan below Recommendations Comment: no dysphagia intervention required, conitnue w/ current POC for cognition Treatment frequency: Min 1x/week Treatment duration: 1 week Interventions: Other (comment) (no dysphagia intervention - continue  w/ cog tx only) Recommendations Recommendations for follow up therapy are one component of a multi-disciplinary discharge planning process, led by the attending physician.  Recommendations may be updated based on patient status, additional functional criteria and insurance authorization. Assessment: Orofacial Exam: Orofacial Exam Oral Cavity: Oral Hygiene: WFL Oral Cavity - Dentition: Adequate natural dentition Orofacial Anatomy: WFL Oral Motor/Sensory Function: WFL Anatomy: Anatomy: WFL Boluses Administered: Boluses Administered Boluses Administered: Thin liquids (Level 0); Mildly thick liquids (Level 2, nectar thick); Puree; Solid  Oral Impairment Domain: Oral Impairment Domain Lip Closure: No labial escape Tongue control during bolus hold: Cohesive bolus between tongue to palatal seal Bolus preparation/mastication: Timely and efficient chewing and mashing Bolus transport/lingual motion: Brisk tongue  motion Oral residue: Trace residue lining oral structures Location of oral residue : Tongue Initiation of pharyngeal swallow : Pyriform sinuses  Pharyngeal Impairment Domain: Pharyngeal Impairment Domain Soft palate elevation: No bolus between soft palate (SP)/pharyngeal wall (PW) Laryngeal elevation: Complete superior movement of thyroid cartilage with complete approximation of arytenoids to epiglottic petiole Anterior hyoid excursion: Complete anterior movement Epiglottic movement: Complete inversion Laryngeal vestibule closure: Complete, no air/contrast in laryngeal vestibule Pharyngeal stripping wave : Present - complete Pharyngeal contraction (A/P view only): N/A Pharyngoesophageal segment opening: Complete distension and complete duration, no obstruction of flow Tongue base retraction: No contrast between tongue base and posterior pharyngeal wall (PPW) Pharyngeal residue: Trace residue within or on pharyngeal structures Location of pharyngeal residue: Valleculae; Tongue base  Esophageal Impairment Domain: Esophageal Impairment Domain Esophageal clearance upright position: Esophageal retention Pill: No data recorded Penetration/Aspiration Scale Score: Penetration/Aspiration Scale Score 1.  Material does not enter airway: Thin liquids (Level 0); Mildly thick liquids (Level 2, nectar thick); Puree; Solid Compensatory Strategies: Compensatory Strategies Compensatory strategies: Yes Straw: Effective Effective Straw: Thin liquid (Level 0); Mildly thick liquid (Level 2, nectar thick) Multiple swallows: Effective Effective Multiple Swallows: Thin liquid (Level 0); Mildly thick liquid (Level 2, nectar thick)   General Information: Caregiver present: No  Diet Prior to this Study: Regular; Thin liquids (Level 0)   Temperature : Normal   Respiratory Status: WFL   Supplemental O2: Nasal cannula (3L O2)   History of Recent Intubation: No  Behavior/Cognition: Alert; Cooperative; Pleasant mood Self-Feeding Abilities: Able to  self-feed Baseline vocal quality/speech: Normal Volitional Cough: Able to elicit Volitional Swallow: Able to elicit Exam Limitations: No limitations Goal Planning: No data recorded No data recorded No data recorded No data recorded Consulted and agree with results and recommendations: Patient; Nurse; Physician Pain: Pain Assessment Pain Assessment: No/denies pain Pain Score: 0 End of Session: Start Time:No data recorded Stop Time: No data recorded Time Calculation:No data recorded Charges: No data recorded SLP visit diagnosis: No data recorded Past Medical History: Past Medical History: Diagnosis Date  Allergy   Anemia   Blood transfusion without reported diagnosis   Dialysis patient (HCC)   Tues,thurs,sat  ESRD (end stage renal disease) (HCC)   TTHS-   Family history of adverse reaction to anesthesia   father had allergic reaction with anesthesia with a dental procedure-(pt. doesn't know)  Hyperlipidemia   Hypertension  Past Surgical History: Past Surgical History: Procedure Laterality Date  A/V FISTULAGRAM Left 12/15/2016  Procedure: A/V Fistulagram - left;  Surgeon: Chuck Hint, MD;  Location: [facility] INVASIVE CV LAB;  Service: Cardiovascular;  Laterality: Left;  AV FISTULA PLACEMENT Left 08/28/2016  Procedure: LEFT UPPER  ARM ARTERIOVENOUS (AV) FISTULA CREATION;  Surgeon: Chuck Hint, MD;  Location: [facility] OR;  Service: Vascular;  Laterality: Left;  DIALYSIS/PERMA CATHETER INSERTION Right 12/21/2017  Procedure: INSERTION OF DIALYSIS CATHETER Right Internal Jugular .;  Surgeon: Sherren Kerns, MD;  Location: [facility] OR;  Service: Vascular;  Laterality: Right;  FISTULA SUPERFICIALIZATION Left 09/23/2017  Procedure: FISTULA PLICATION LEFT ARM;  Surgeon: Sherren Kerns, MD;  Location: [facility] OR;  Service: Vascular;  Laterality: Left;  FISTULOGRAM Left 12/21/2017  Procedure: FISTULOGRAM with Balloon Angioplasty.;  Surgeon: Sherren Kerns, MD;  Location: [facility] OR;  Service: Vascular;  Laterality: Left;   HEMATOMA EVACUATION Left 08/27/2020  Procedure: EVACUATION HEMATOMA LEFT ARM;  Surgeon: Sherren Kerns, MD;  Location: [facility] OR;  Service: Vascular;  Laterality: Left;  IR FLUORO GUIDE CV LINE LEFT  09/22/2022  IR FLUORO GUIDE CV LINE RIGHT  09/12/2022  IR FLUORO GUIDE CV LINE RIGHT  09/17/2022  IR REMOVAL TUN CV CATH W/O FL  09/09/2022  IR THORACENTESIS ASP PLEURAL SPACE W/IMG GUIDE  08/22/2017  1.2 L -right-sided  IR THORACENTESIS ASP PLEURAL SPACE W/IMG GUIDE  10/19/2017  IR US GUIDE VASC ACCESS LEFT  09/22/2022  IR US GUIDE VASC ACCESS RIGHT  09/12/2022  KIDNEY TRANSPLANT    REVISON OF ARTERIOVENOUS FISTULA Left 12/21/2017  Procedure: PLICATION and Ligation of F LEFT ARM ARTERIOVENOUS FISTULA;  Surgeon: Sherren Kerns, MD;  Location: [facility] OR;  Service: Vascular;  Laterality: Left;  REVISON OF ARTERIOVENOUS FISTULA Left 07/16/2020  Procedure: EXCISION OF LEFT ARM ARTERIOVENOUS FISTULA;  Surgeon: Sherren Kerns, MD;  Location: [facility] OR;  Service: Vascular;  Laterality: Left;  stent in kidneys    dec 2017  TRANSTHORACIC ECHOCARDIOGRAM  09/22/2017   Severe LVH.  Normal function -EF 55-60%.  GRII DD.  Moderate RV dilation with mildly reduced RV function.   Fixed right coronary cusp with very mild aortic stenosis.  The myocardium has a speckled appearance --> .   Recommend cardiac MRI to evaluate for amyloid  UPPER EXTREMITY VENOGRAPHY Bilateral 06/01/2020  Procedure: UPPER EXTREMITY VENOGRAPHY;  Surgeon: Chuck Hint, MD;  Location: [facility] INVASIVE CV LAB;  Service: Cardiovascular;  Laterality: Bilateral; Pati Gallo 10/09/2022, 10:11 AM  DG CHEST PORT 1 VIEW  Result Date: 10/09/2022 CLINICAL DATA:  Recent pneumonia, low O2 sats with shortness of breath. EXAM: PORTABLE CHEST 1 VIEW COMPARISON:  Chest radiograph 10/05/2022 FINDINGS: The left-sided vascular catheter is stable with tip terminating in the right atrium. The heart is enlarged, unchanged. The upper mediastinal contours are prominent, also unchanged Lung  volumes are low. There is worsened opacity in the right midlung. There is probable mild bibasilar subsegmental atelectasis. There is vascular congestion and probable mild pulmonary interstitial edema, slightly worsened. There is no significant pleural effusion there is no pneumothorax. There is no acute osseous abnormality. IMPRESSION: Cardiomegaly with vascular congestion and probable mild pulmonary interstitial edema, slightly worsened. More focal airspace opacity in the right midlung could reflect superimposed infection in the correct clinical setting. Electronically Signed   By: Ryan [name] M.D.   On: 10/09/2022 08:44    Microbiology: No results found for this or any previous visit (from the past 240 hour(s)).   Labs: Basic Metabolic Panel: Recent Labs  Lab 11/01/22 0348 11/02/22 0202 11/03/22 0002 11/04/22 0034 11/05/22 0049 11/06/22 0654 11/07/22 0437  NA 126* 126* 127* 125* 127* 125* 126*  K 3.5 3.3* 3.4* 3.7 3.8 3.9 3.5  CL 87* 90* 88* 87* 89* 86* 88*  CO2 18* 19* 19* 17* 21* 19* 19*  GLUCOSE 97 107* 92 143* 121* 100*  113*  BUN 54* 28* 48* 68* 39* 61* 43*  CREATININE 5.70* 4.04* 5.40* 6.91* 4.73* 6.65* 4.84*  CALCIUM 9.0 8.7* 9.2 9.2 9.2 9.3 9.3  MG 1.9 2.4  --   --   --   --   --   PHOS 6.4* 4.7* 6.5* 7.3* 4.6 5.6* 4.8*   Liver Function Tests: Recent Labs  Lab 11/03/22 0002 11/04/22 0034 11/05/22 0049 11/06/22 0654 11/07/22 0437  ALBUMIN 3.2* 3.1* 3.2* 3.2* 3.2*   No results for input(s): "LIPASE", "AMYLASE" in the last 168 hours. No results for input(s): "AMMONIA" in the last 168 hours. CBC: Recent Labs  Lab 11/04/22 0034 11/06/22 0654 11/07/22 0437  WBC 9.3 9.9 9.8  NEUTROABS 6.3  --   --   HGB 9.4* 10.0* 10.5*  HCT 31.0* 33.4* 34.9*  MCV 81.8 82.3 82.9  PLT 280 251 240   Cardiac Enzymes: No results for input(s): "CKTOTAL", "CKMB", "CKMBINDEX", "TROPONINI" in the last 168 hours. BNP: BNP (last 3 results) Recent Labs    09/07/22 0215 10/02/22 1234   BNP 433.4* 355.0*    ProBNP (last 3 results) No results for input(s): "PROBNP" in the last 8760 hours.  CBG: No results for input(s): "GLUCAP" in the last 168 hours.     Signed:  Ramiro Harvest MD.  [facility] 11/07/2022, 5:09 PM

## 2022-11-07 NOTE — Progress Notes (Addendum)
Contacted [facility] to advise clinic that pt's new Hep B antigen lab result is available. Clinic advised pt for possible d/c today. Contacted attending/team to confirm plans so clinic can be advised if pt will be at treatment tomorrow. Will assist as needed.   Olivia Canter Renal Navigator [phone]  Addendum at 2:05 pm: Per attending, plan remains for pt to d/c today. Contacted [facility] to advise clinic of planned d/c for today and that pt should resume care tomorrow.

## 2022-11-07 NOTE — Progress Notes (Signed)
Mobility Specialist Progress Note:    11/07/22 1056  Mobility  Activity Ambulated with assistance in hallway  Level of Assistance Contact guard assist, steadying assist  Assistive Device None  Distance Ambulated (ft) 230 ft  Activity Response Tolerated well  Mobility Referral Yes  $Mobility charge 1 Mobility  Mobility Specialist Start Time (ACUTE ONLY) 1003  Mobility Specialist Stop Time (ACUTE ONLY) 1019  Mobility Specialist Time Calculation (min) (ACUTE ONLY) 16 min   Received pt in chair, agreeable to ambulate. Pt requested to not use the RW this time, had contact guard for safety. Pt did very well w/o the RW just had two moment of minor LOB. N c/o throughout session. Pt return t chair w/ call bell and personal belongings in reach. All needs met.  Thompson Grayer Mobility Specialist  Please contact vis Secure Chat or  Rehab Office [phone]

## 2022-11-07 NOTE — Progress Notes (Signed)
[facility] Progress Note   Subjective:    Seen in room. Good spirits this am, he's hoping he will be able to go home today. Soft BP but not symptomatic. No complaints this am.   Objective Vitals:   11/06/22 2005 11/07/22 0608 11/07/22 0700 11/07/22 0857  BP: 92/68 98/72 (!) 84/62   Pulse: 93 99 99   Resp: 16 20 18    Temp: 97.7 F (36.5 C) (!) 97.2 F (36.2 C) (!) 97.5 F (36.4 C)   TempSrc: Axillary Oral Oral   SpO2: 95% 97% 100% 100%  Weight:  70.1 kg    Height:       Physical Exam General: Alert male in NAD Heart: RRR, no murmurs, rubs or gallops Lungs: CTA bilaterally, on RA Abdomen: Soft, non-distended, +BS Extremities: No edema b/l lower extremities Dialysis Access: [facility]  Filed Weights   11/05/22 0352 11/06/22 0435 11/07/22 0608  Weight: 71.5 kg 72.1 kg 70.1 kg    Intake/Output Summary (Last 24 hours) at 11/07/2022 1058 Last data filed at 11/06/2022 2122 Gross per 24 hour  Intake 237 ml  Output 2500 ml  Net -2263 ml    Additional Objective Labs: Basic Metabolic Panel: Recent Labs  Lab 11/05/22 0049 11/06/22 0654 11/07/22 0437  NA 127* 125* 126*  K 3.8 3.9 3.5  CL 89* 86* 88*  CO2 21* 19* 19*  GLUCOSE 121* 100* 113*  BUN 39* 61* 43*  CREATININE 4.73* 6.65* 4.84*  CALCIUM 9.2 9.3 9.3  PHOS 4.6 5.6* 4.8*   Liver Function Tests: Recent Labs  Lab 11/05/22 0049 11/06/22 0654 11/07/22 0437  ALBUMIN 3.2* 3.2* 3.2*   No results for input(s): "LIPASE", "AMYLASE" in the last 168 hours. CBC: Recent Labs  Lab 11/04/22 0034 11/06/22 0654 11/07/22 0437  WBC 9.3 9.9 9.8  NEUTROABS 6.3  --   --   HGB 9.4* 10.0* 10.5*  HCT 31.0* 33.4* 34.9*  MCV 81.8 82.3 82.9  PLT 280 251 240   Blood Culture    Component Value Date/Time   SDES BLOOD BLOOD RIGHT ARM 10/05/2022 2032   SPECREQUEST  10/05/2022 2032    BOTTLES DRAWN AEROBIC AND ANAEROBIC Blood Culture adequate volume   CULT  10/05/2022 2032    NO GROWTH 5 DAYS Performed at [facility], [address], [place], [place] [address]    REPTSTATUS 10/10/2022 FINAL 10/05/2022 2032    Cardiac Enzymes: No results for input(s): "CKTOTAL", "CKMB", "CKMBINDEX", "TROPONINI" in the last 168 hours. CBG: No results for input(s): "GLUCAP" in the last 168 hours. Iron Studies: No results for input(s): "IRON", "TIBC", "TRANSFERRIN", "FERRITIN" in the last 72 hours. Lab Results  Component Value Date   INR 1.4 (H) 10/10/2022   INR 1.4 (H) 09/10/2022   INR 1.13 09/23/2017   Studies/Results: No results found.  Medications:  sodium chloride 10 mL/hr at 10/18/22 2141   sodium chloride      (feeding supplement) PROSource Plus  30 mL Oral TID WC   arformoterol  15 mcg Nebulization BID   atorvastatin  40 mg Oral Daily   Chlorhexidine Gluconate Cloth  6 each Topical Q0600   Darbepoetin Alfa  150 mcg Subcutaneous Q Thu-1800   feeding supplement (NEPRO CARB STEADY)  237 mL Oral TID BM   heparin  5,000 Units Subcutaneous Q8H   midodrine  20 mg Oral TID WC   multivitamin  1 tablet Oral QHS   pantoprazole  40 mg Oral Daily   revefenacin  175 mcg Nebulization Daily    Dialysis Orders: TTS at [facility] 3:45hr, 450/A1.5, EDW 85.7kg, 2K/2Ca bath, TDC, heparin 3000 unit bolus - no ESA - Calcitriol 1.22mcg PO q HD  Assessment/Plan:  MSSA bacteremia/endocarditis/L5-S1 discitis: Blood Cx 6/16 MSSA, negative Cx on 6/17, 6/19. S/p TDC line holiday. TTE was concerning for endocarditis. TEE cancelled d/t high risk factors. LS MRI with possible L5-S1 discitis. Completed 6 week course of cefazolin  ESRD: Continue HD TTS. Will write orders for Sat in case still here 3. Hyponatremia: Likely 2nd volume overload.  4. BP/Volume overload: Hypotension preventing adequate UF. Required ICU transfer for CRRT 7/21 - 10/15/22. Reinforced fluid restrictions. Metoprolol stopped and Midodrine raised to 20mg  TID. Current EDW ranging 71-73.5kg, need to keep his EDW low. 5. Deconditioning [name]: was in CIR,  was transferred back to 6E due to  medical complications. 5. ESRD:  6. Access: TDC pulled 6/18 for line holiday, then new LIJ TDC placed 7/01 per IR. For perm access: s/p L AVF ligation in 2019. Last seen by Dr. Edilia Bo 01/2022 - limited mobility in R arm so planning for L arm graft but had not scheduled surgery yet. Can revisit as an outpatient once bacteremia resolved.  7. Anemia (ESRD + ABLA, + FOBT): Hgb 10. Continue Aranesp q Thursday.   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 Phos fluctuating, not on binders for now 9. Nutrition - On dysphagia diet and protein supplements    Ryan Blase PA-C [facility] 11/07/2022,10:58 AM

## 2022-11-07 NOTE — TOC Transition Note (Signed)
Transition of Care [facility]) - CM/SW Discharge Note   Patient Details  Name: Ryan [name] MRN: [id number] Date of Birth: 03-07-1961  [facility] CM/SW Contact:  Gala Lewandowsky, RN Phone Number: 11/07/2022, 10:55 AM   Clinical Narrative: DME to be delivered today to the hospital from [facility]. Home Health has been established via [place]. No further needs identified at this time.    Final next level of care: Home w Home Health Services Barriers to Discharge: No Barriers Identified   Patient Goals and CMS Choice CMS Medicare.gov Compare Post Acute Care list provided to:: Patient Choice offered to / list presented to : Patient (No preference)  Discharge Plan and Services Additional resources added to the After Visit Summary for   In-house Referral: Clinical Social Work Discharge Planning Services: CM Consult Post Acute Care Choice: Home Health          DME Arranged: Walker rolling with seat, Lightweight manual wheelchair with seat cushion, Tub bench DME Agency: [employer] Date DME Agency Contacted: 11/06/22 Time DME Agency Contacted: 1014 Representative spoke with at DME Agency: Vaughan Basta HH Arranged: RN, Disease Management, OT, PT HH Agency: [facility] Date [facility] Agency Contacted: 11/05/22 Time HH Agency Contacted: 1626 Representative spoke with at [facility] Agency: Amy Social Determinants of Health (SDOH) Interventions SDOH Screenings   Food Insecurity: No Food Insecurity (10/09/2022)  Housing: Low Risk  (10/09/2022)  Transportation Needs: No Transportation Needs (10/09/2022)  Utilities: Not At Risk (10/09/2022)  Financial Resource Strain: Medium Risk (05/16/2022)   [facility]  Tobacco Use: Low Risk  (10/09/2022)   Readmission Risk Interventions    09/10/2022    1:53 PM  Readmission Risk Prevention Plan  Transportation Screening Complete  PCP or Specialist Appt within 5-7  Days Complete  Home Care Screening Complete  Medication Review (RN CM) Referral to Pharmacy

## 2022-11-07 NOTE — Progress Notes (Signed)
Nutrition Follow-up  DOCUMENTATION CODES:   Non-severe (moderate) malnutrition in context of chronic illness (worsening malnutrition with this acute illness)  INTERVENTION:  Continue Nepro Shake po TID, each supplement provides 425 kcal and 19 grams protein   Continue Prosource Plus PO TID, each supplement provides 100 kcal and 15 g protein    Continue Renal MVI    NUTRITION DIAGNOSIS:   Moderate Malnutrition related to chronic illness (worsening with acute illness) as evidenced by moderate muscle depletion, moderate fat depletion, energy intake < 75% for > or equal to 1 month. -ongoing   GOAL:   Patient will meet greater than or equal to 90% of their needs -progressing   MONITOR:   PO intake, Supplement acceptance, Weight trends, Labs  REASON FOR ASSESSMENT:   Consult Assessment of nutrition requirement/status (CRRT)  ASSESSMENT:   62 yo male admitted from CIR with afib with RVR and acute respiratory failure with pulmonary edema. 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  PMH includes ESRD on iHD (since 2018), HTN, HLD  Patient was sleeping at time of visit. RD did not disturb him. Patient may be d/c'ing home with [facility] today per TOC note.   RD reviewed chart and noted that his BP has improved. He is still volume overloaded. Patient to continue HD after dc. His diet was pureed for a day or two, before advancing to a regular textured diet. Patient is also newly on a fluid restriction likely due to his semi excessive fluid intake when he was having throat irritation.   Labs: Na 126, Glu 113, BUN 43, Cr 4.84, Phos 4.8  Meds: reviewed   PO: 100% meal intake x 1 documented meal on 8/12 (no further meals documented)   Wt: admit- 178#, current- 154#  I/O's: -3 L since admission    Diet Order:   Diet Order             Diet renal  with fluid restriction Fluid restriction: 1200 mL Fluid; Room service appropriate? Yes; Fluid consistency: Thin  Diet effective now                   EDUCATION NEEDS:   Education needs have been addressed  Skin:  Skin Assessment: Skin Integrity Issues: Skin Integrity Issues:: Other (Comment) Other: skin tears  Last BM:  8/6 type 3  Height:   Ht Readings from Last 1 Encounters:  10/09/22 5\' 9"  (1.753 m)    Weight:   Wt Readings from Last 1 Encounters:  11/07/22 70.1 kg    Ideal Body Weight:     BMI:  Body mass index is 22.83 kg/m.  Estimated Nutritional Needs:   Kcal:  2200-2400 kcals  Protein:  120-140 g  Fluid:  1L plus UOP    Leodis Rains, RDN, LDN  Clinical Nutrition

## 2022-11-07 NOTE — Progress Notes (Signed)
Physical Therapy Treatment Patient Details Name: Ryan [name] MRN: [id number] DOB: 29-Sep-1960 Today's Date: 11/07/2022   History of Present Illness 62 yo male presents to [facility] from CIR on 7/18 for afib RVR, aspiration PNA. RR on 7/21 for acute respiratory distress due to pulmonary edema requiring bipap and CRRT, and transfer to ICU. 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 S/p  HD cath L subclavian on 7/1. PMH includes HTN, HLD, ESRD on HD TTS.    PT Comments  Pt excited to d/c home today. Pt wanted to focus session on gait training without AD, pt toleraetd well with standing rest breaks and supervision for safety. PT recommended continued use of rollator once d/c, especially with to/from Hd, community mobility. PT reviewed energy conservation topics (frequent rest breaks, prioritizing day, asking for assist as needed), pt expresses understanding. Plan for HHPT appropriate at this time, will have daughter to assist as needed.      If plan is discharge home, recommend the following: Help with stairs or ramp for entrance;Assist for transportation;Assistance with cooking/housework;A little help with walking and/or transfers;A little help with bathing/dressing/bathroom   Can travel by private Psychologist, clinical (4 wheels);BSC/3in1    Recommendations for Other Services       Precautions / Restrictions Precautions Precautions: Fall Required Braces or Orthoses: Spinal Brace Spinal Brace: Lumbar corset Spinal Brace Comments: for comfort - not donned this date     Mobility  Bed Mobility               General bed mobility comments: up in chair    Transfers Overall transfer level: Needs assistance Equipment used: Rolling walker (2 wheels) Transfers: Sit to/from Stand Sit to Stand: Supervision           General  transfer comment: for safety, use of hands for power up and sitting down    Ambulation/Gait Ambulation/Gait assistance: Supervision Gait Distance (Feet): 100 Feet (x2 - standing rest break to recover fatigue) Assistive device: None Gait Pattern/deviations: Step-through pattern, Decreased stride length Gait velocity: decr     General Gait Details: declined use of AD today, supervision for safety and min weaving of gait with head turns, pt-corrected. standing rest break   Stairs             Wheelchair Mobility     Tilt Bed    Modified Rankin (Stroke Patients Only)       Balance Overall balance assessment: Needs assistance Sitting-balance support: No upper extremity supported, Feet supported Sitting balance-Leahy Scale: Good Sitting balance - Comments: sitting EOB   Standing balance support: During functional activity, No upper extremity supported Standing balance-Leahy Scale: Fair                              Cognition Arousal: Alert Behavior During Therapy: WFL for tasks assessed/performed Overall Cognitive Status: Within Functional Limits for tasks assessed                                          Exercises      General Comments        Pertinent Vitals/Pain Pain Assessment Pain Assessment: Faces Faces Pain Scale: Hurts a little bit Pain Location:  back Pain Descriptors / Indicators: Sore, Discomfort Pain Intervention(s): Limited activity within patient's tolerance, Monitored during session, Repositioned    Home Living                          Prior Function            PT Goals (current goals can now be found in the care plan section) Acute Rehab PT Goals Patient Stated Goal: back to rehab to get stronger PT Goal Formulation: With patient Time For Goal Achievement: 11/17/22 Potential to Achieve Goals: Good Progress towards PT goals: Progressing toward goals    Frequency    Min 1X/week      PT  Plan Current plan remains appropriate    Co-evaluation              AM-PAC PT "6 Clicks" Mobility   Outcome Measure  Help needed turning from your back to your side while in a flat bed without using bedrails?: None Help needed moving from lying on your back to sitting on the side of a flat bed without using bedrails?: None Help needed moving to and from a bed to a chair (including a wheelchair)?: A Little Help needed standing up from a chair using your arms (e.g., wheelchair or bedside chair)?: A Little Help needed to walk in hospital room?: A Little Help needed climbing 3-5 steps with a railing? : A Little 6 Click Score: 20    End of Session Equipment Utilized During Treatment: Gait belt Activity Tolerance: Patient tolerated treatment well Patient left: with call bell/phone within reach;in chair Nurse Communication: Mobility status PT Visit Diagnosis: Other abnormalities of gait and mobility (R26.89);Muscle weakness (generalized) (M62.81)     Time: 4098-1191 PT Time Calculation (min) (ACUTE ONLY): 14 min  Charges:    $Therapeutic Activity: 8-22 mins PT General Charges $$ ACUTE PT VISIT: 1 Visit                     Marye Round, PT DPT Acute Rehabilitation Services Secure Chat Preferred  Office [phone]    [name] 11/07/2022, 3:51 PM

## 2022-11-08 ENCOUNTER — Telehealth: Payer: Self-pay | Admitting: Nephrology

## 2022-11-08 DIAGNOSIS — N186 End stage renal disease: Secondary | ICD-10-CM | POA: Diagnosis not present

## 2022-11-08 DIAGNOSIS — Z992 Dependence on renal dialysis: Secondary | ICD-10-CM | POA: Diagnosis not present

## 2022-11-08 DIAGNOSIS — N2581 Secondary hyperparathyroidism of renal origin: Secondary | ICD-10-CM | POA: Diagnosis not present

## 2022-11-08 DIAGNOSIS — E876 Hypokalemia: Secondary | ICD-10-CM | POA: Diagnosis not present

## 2022-11-08 DIAGNOSIS — D689 Coagulation defect, unspecified: Secondary | ICD-10-CM | POA: Diagnosis not present

## 2022-11-08 DIAGNOSIS — R519 Headache, unspecified: Secondary | ICD-10-CM | POA: Diagnosis not present

## 2022-11-08 NOTE — Telephone Encounter (Signed)
Transition of Care - Initial Contact from Inpatient Facility  Date of discharge: 11/07/22 Date of contact: 11/08/22  Method: Phone Spoke to: Patient  Patient contacted to discuss transition of care from recent inpatient hospitalization. Patient was admitted to [facility] from 7/18 -11/07/22 with discharge diagnosis of MSSA bacteremia/endocarditis   The discharge medication list was reviewed. Patient understands the changes.   Patient will return to his/her outpatient HD unit on: He did go to dialysis today 8/17.

## 2022-11-09 NOTE — Care Plan (Signed)
Was notified by RN that when patient was discharged on 11/07/2022 he did not have prescriptions for Ryan [name]. Informed RN that those nebulizers will be discontinued. Symbicort, Spiriva, albuterol MDI has been called directly to patient's pharmacy for pickup.  No charge.

## 2022-11-11 DIAGNOSIS — N186 End stage renal disease: Secondary | ICD-10-CM | POA: Diagnosis not present

## 2022-11-11 DIAGNOSIS — R6510 Systemic inflammatory response syndrome (SIRS) of non-infectious origin without acute organ dysfunction: Secondary | ICD-10-CM | POA: Diagnosis not present

## 2022-11-11 DIAGNOSIS — E785 Hyperlipidemia, unspecified: Secondary | ICD-10-CM | POA: Diagnosis not present

## 2022-11-11 DIAGNOSIS — E1122 Type 2 diabetes mellitus with diabetic chronic kidney disease: Secondary | ICD-10-CM | POA: Diagnosis not present

## 2022-11-11 DIAGNOSIS — M4647 Discitis, unspecified, lumbosacral region: Secondary | ICD-10-CM | POA: Diagnosis not present

## 2022-11-11 DIAGNOSIS — I130 Hypertensive heart and chronic kidney disease with heart failure and stage 1 through stage 4 chronic kidney disease, or unspecified chronic kidney disease: Secondary | ICD-10-CM | POA: Diagnosis not present

## 2022-11-11 DIAGNOSIS — I5032 Chronic diastolic (congestive) heart failure: Secondary | ICD-10-CM | POA: Diagnosis not present

## 2022-11-11 DIAGNOSIS — R918 Other nonspecific abnormal finding of lung field: Secondary | ICD-10-CM | POA: Diagnosis not present

## 2022-11-11 DIAGNOSIS — E440 Moderate protein-calorie malnutrition: Secondary | ICD-10-CM | POA: Diagnosis not present

## 2022-11-13 ENCOUNTER — Ambulatory Visit: Payer: Self-pay

## 2022-11-13 NOTE — Telephone Encounter (Signed)
Chief Complaint: Cough Symptoms: dry cough, fatigue, recently discharged from hospital Frequency: ongoing  Pertinent Negatives: Patient denies chest pain, SOB,  Disposition: [] ED /[] Urgent Care (no appt availability in office) / [] Appointment(In office/virtual)/ []  [facility]/ [] Home Care/ [] Refused Recommended Disposition /[] [facility]/ [x]  Follow-up with PCP Additional Notes: Patient states he was recently discharged from the hospital and has had a severe cough and the robitussin that was given is not helping the cough. Patient also reports that he is very fatigued. Care advice was given. Patient stated he would contact PCP for home health orders at this time.  Hospital follow-up scheduled with PCP 11/19/22 Reason for Disposition  [1] Continuous (nonstop) coughing interferes with work or school AND [2] no improvement using cough treatment per Care Advice  Answer Assessment - Initial Assessment Questions 1. ONSET: "When did the cough begin?"      Ongoing 2. SEVERITY: "How bad is the cough today?"      Severe 3. SPUTUM: "Describe the color of your sputum" (none, dry cough; clear, white, yellow, green)     Yellow and sometimes clear  4. HEMOPTYSIS: "Are you coughing up any blood?" If so ask: "How much?" (flecks, streaks, tablespoons, etc.)     No  5. DIFFICULTY BREATHING: "Are you having difficulty breathing?" If Yes, ask: "How bad is it?" (e.g., mild, moderate, severe)    - MILD: No SOB at rest, mild SOB with walking, speaks normally in sentences, can lie down, no retractions, pulse < 100.    - MODERATE: SOB at rest, SOB with minimal exertion and prefers to sit, cannot lie down flat, speaks in phrases, mild retractions, audible wheezing, pulse 100-120.    - SEVERE: Very SOB at rest, speaks in single words, struggling to breathe, sitting hunched forward, retractions, pulse > 120      Moderate  6. FEVER: "Do you have a fever?" If Yes, ask: "What is your temperature, how  was it measured, and when did it start?"     No  7. CARDIAC HISTORY: "Do you have any history of heart disease?" (e.g., heart attack, congestive heart failure)      Recent low blood pressure  8. LUNG HISTORY: "Do you have any history of lung disease?"  (e.g., pulmonary embolus, asthma, emphysema)     No 9. PE RISK FACTORS: "Do you have a history of blood clots?" (or: recent major surgery, recent prolonged travel, bedridden)     No 10. OTHER SYMPTOMS: "Do you have any other symptoms?" (e.g., runny nose, wheezing, chest pain)       Coughing, vomiting, fatigue  Protocols used: Cough - Acute Non-Productive-A-AH

## 2022-11-14 DIAGNOSIS — J159 Unspecified bacterial pneumonia: Secondary | ICD-10-CM | POA: Diagnosis not present

## 2022-11-14 DIAGNOSIS — R5381 Other malaise: Secondary | ICD-10-CM | POA: Diagnosis not present

## 2022-11-14 DIAGNOSIS — G4733 Obstructive sleep apnea (adult) (pediatric): Secondary | ICD-10-CM | POA: Diagnosis not present

## 2022-11-14 DIAGNOSIS — M4646 Discitis, unspecified, lumbar region: Secondary | ICD-10-CM | POA: Diagnosis not present

## 2022-11-14 DIAGNOSIS — Z992 Dependence on renal dialysis: Secondary | ICD-10-CM | POA: Diagnosis not present

## 2022-11-14 DIAGNOSIS — E46 Unspecified protein-calorie malnutrition: Secondary | ICD-10-CM | POA: Diagnosis not present

## 2022-11-14 DIAGNOSIS — N186 End stage renal disease: Secondary | ICD-10-CM | POA: Diagnosis not present

## 2022-11-14 DIAGNOSIS — R053 Chronic cough: Secondary | ICD-10-CM | POA: Diagnosis not present

## 2022-11-14 DIAGNOSIS — I10 Essential (primary) hypertension: Secondary | ICD-10-CM | POA: Diagnosis not present

## 2022-11-14 DIAGNOSIS — I330 Acute and subacute infective endocarditis: Secondary | ICD-10-CM | POA: Diagnosis not present

## 2022-11-14 DIAGNOSIS — G2581 Restless legs syndrome: Secondary | ICD-10-CM | POA: Diagnosis not present

## 2022-11-14 DIAGNOSIS — T8611 Kidney transplant rejection: Secondary | ICD-10-CM | POA: Diagnosis not present

## 2022-11-16 DIAGNOSIS — K922 Gastrointestinal hemorrhage, unspecified: Secondary | ICD-10-CM | POA: Diagnosis not present

## 2022-11-16 DIAGNOSIS — D849 Immunodeficiency, unspecified: Secondary | ICD-10-CM | POA: Diagnosis not present

## 2022-11-16 DIAGNOSIS — I2782 Chronic pulmonary embolism: Secondary | ICD-10-CM | POA: Diagnosis not present

## 2022-11-16 DIAGNOSIS — D62 Acute posthemorrhagic anemia: Secondary | ICD-10-CM | POA: Diagnosis not present

## 2022-11-16 DIAGNOSIS — B371 Pulmonary candidiasis: Secondary | ICD-10-CM | POA: Diagnosis not present

## 2022-11-16 DIAGNOSIS — I9589 Other hypotension: Secondary | ICD-10-CM | POA: Diagnosis not present

## 2022-11-16 DIAGNOSIS — J168 Pneumonia due to other specified infectious organisms: Secondary | ICD-10-CM | POA: Diagnosis not present

## 2022-11-16 DIAGNOSIS — G9341 Metabolic encephalopathy: Secondary | ICD-10-CM | POA: Diagnosis not present

## 2022-11-16 DIAGNOSIS — R0603 Acute respiratory distress: Secondary | ICD-10-CM | POA: Diagnosis not present

## 2022-11-16 DIAGNOSIS — R918 Other nonspecific abnormal finding of lung field: Secondary | ICD-10-CM | POA: Diagnosis not present

## 2022-11-16 DIAGNOSIS — E1122 Type 2 diabetes mellitus with diabetic chronic kidney disease: Secondary | ICD-10-CM | POA: Diagnosis not present

## 2022-11-16 DIAGNOSIS — I6523 Occlusion and stenosis of bilateral carotid arteries: Secondary | ICD-10-CM | POA: Diagnosis not present

## 2022-11-16 DIAGNOSIS — K921 Melena: Secondary | ICD-10-CM | POA: Diagnosis not present

## 2022-11-16 DIAGNOSIS — T8612 Kidney transplant failure: Secondary | ICD-10-CM | POA: Diagnosis not present

## 2022-11-16 DIAGNOSIS — I82411 Acute embolism and thrombosis of right femoral vein: Secondary | ICD-10-CM | POA: Diagnosis not present

## 2022-11-16 DIAGNOSIS — I120 Hypertensive chronic kidney disease with stage 5 chronic kidney disease or end stage renal disease: Secondary | ICD-10-CM | POA: Diagnosis not present

## 2022-11-16 DIAGNOSIS — A419 Sepsis, unspecified organism: Secondary | ICD-10-CM | POA: Diagnosis not present

## 2022-11-16 DIAGNOSIS — K760 Fatty (change of) liver, not elsewhere classified: Secondary | ICD-10-CM | POA: Diagnosis not present

## 2022-11-16 DIAGNOSIS — K7200 Acute and subacute hepatic failure without coma: Secondary | ICD-10-CM | POA: Diagnosis not present

## 2022-11-16 DIAGNOSIS — R0602 Shortness of breath: Secondary | ICD-10-CM | POA: Diagnosis not present

## 2022-11-16 DIAGNOSIS — R570 Cardiogenic shock: Secondary | ICD-10-CM | POA: Diagnosis not present

## 2022-11-16 DIAGNOSIS — I429 Cardiomyopathy, unspecified: Secondary | ICD-10-CM | POA: Diagnosis not present

## 2022-11-16 DIAGNOSIS — K5731 Diverticulosis of large intestine without perforation or abscess with bleeding: Secondary | ICD-10-CM | POA: Diagnosis not present

## 2022-11-16 DIAGNOSIS — I70201 Unspecified atherosclerosis of native arteries of extremities, right leg: Secondary | ICD-10-CM | POA: Diagnosis not present

## 2022-11-16 DIAGNOSIS — D649 Anemia, unspecified: Secondary | ICD-10-CM | POA: Diagnosis not present

## 2022-11-16 DIAGNOSIS — D65 Disseminated intravascular coagulation [defibrination syndrome]: Secondary | ICD-10-CM | POA: Diagnosis not present

## 2022-11-16 DIAGNOSIS — R6521 Severe sepsis with septic shock: Secondary | ICD-10-CM | POA: Diagnosis not present

## 2022-11-16 DIAGNOSIS — N186 End stage renal disease: Secondary | ICD-10-CM | POA: Diagnosis not present

## 2022-11-16 DIAGNOSIS — N179 Acute kidney failure, unspecified: Secondary | ICD-10-CM | POA: Diagnosis not present

## 2022-11-16 DIAGNOSIS — Z992 Dependence on renal dialysis: Secondary | ICD-10-CM | POA: Diagnosis not present

## 2022-11-16 DIAGNOSIS — J9601 Acute respiratory failure with hypoxia: Secondary | ICD-10-CM | POA: Diagnosis not present

## 2022-11-17 DIAGNOSIS — D62 Acute posthemorrhagic anemia: Secondary | ICD-10-CM | POA: Diagnosis not present

## 2022-11-17 DIAGNOSIS — D631 Anemia in chronic kidney disease: Secondary | ICD-10-CM | POA: Diagnosis not present

## 2022-11-17 DIAGNOSIS — I959 Hypotension, unspecified: Secondary | ICD-10-CM | POA: Diagnosis not present

## 2022-11-17 DIAGNOSIS — I700 Atherosclerosis of aorta: Secondary | ICD-10-CM | POA: Diagnosis not present

## 2022-11-17 DIAGNOSIS — K5790 Diverticulosis of intestine, part unspecified, without perforation or abscess without bleeding: Secondary | ICD-10-CM | POA: Diagnosis not present

## 2022-11-17 DIAGNOSIS — Z992 Dependence on renal dialysis: Secondary | ICD-10-CM | POA: Diagnosis not present

## 2022-11-17 DIAGNOSIS — E861 Hypovolemia: Secondary | ICD-10-CM | POA: Diagnosis not present

## 2022-11-17 DIAGNOSIS — E875 Hyperkalemia: Secondary | ICD-10-CM | POA: Diagnosis not present

## 2022-11-17 DIAGNOSIS — K922 Gastrointestinal hemorrhage, unspecified: Secondary | ICD-10-CM | POA: Diagnosis not present

## 2022-11-17 DIAGNOSIS — N186 End stage renal disease: Secondary | ICD-10-CM | POA: Diagnosis not present

## 2022-11-18 ENCOUNTER — Inpatient Hospital Stay: Payer: No Typology Code available for payment source | Admitting: Internal Medicine

## 2022-11-18 DIAGNOSIS — D689 Coagulation defect, unspecified: Secondary | ICD-10-CM | POA: Diagnosis not present

## 2022-11-18 DIAGNOSIS — R579 Shock, unspecified: Secondary | ICD-10-CM | POA: Diagnosis not present

## 2022-11-18 DIAGNOSIS — D696 Thrombocytopenia, unspecified: Secondary | ICD-10-CM | POA: Diagnosis not present

## 2022-11-18 DIAGNOSIS — K922 Gastrointestinal hemorrhage, unspecified: Secondary | ICD-10-CM | POA: Diagnosis not present

## 2022-11-18 DIAGNOSIS — I440 Atrioventricular block, first degree: Secondary | ICD-10-CM | POA: Diagnosis not present

## 2022-11-18 DIAGNOSIS — R000 Tachycardia, unspecified: Secondary | ICD-10-CM | POA: Diagnosis not present

## 2022-11-18 NOTE — Progress Notes (Deleted)
Patient: Ryan [name]  DOB: 04-01-1960 MRN: [id number] PCP: Sharin Grave, MD  Referring Provider: ***  No chief complaint on file.    Patient Active Problem List   Diagnosis Date Noted   Osteomyelitis (HCC) 11/05/2022   Hypotension 11/05/2022   Hyponatremia 10/28/2022   Subacute endocarditis 10/28/2022   CAP (community acquired pneumonia) 10/25/2022   Heme positive stool 10/11/2022   Acute and subacute infective endocarditis in diseases classified elsewhere 10/11/2022   Multifocal atrial tachycardia 10/09/2022   Leukocytosis 10/02/2022   Acute osteomyelitis of lumbar spine (HCC) 09/26/2022   Diskitis 09/25/2022   Malnutrition of moderate degree 09/24/2022   MSSA bacteremia 09/08/2022   Discitis of lumbosacral region 09/08/2022   Acute low back pain 09/07/2022   Lung nodules 09/07/2022   Elevated troponin 09/07/2022   SIRS (systemic inflammatory response syndrome) (HCC) 09/07/2022   Hypocalcemia 04/24/2020   COVID-19 01/20/2020   Diverticulitis of colon with bleeding 03/09/2019   Complication of vascular dialysis catheter 12/27/2018   Iron deficiency anemia, unspecified 05/06/2018   Acute hypoxic respiratory failure (HCC) 12/25/2017   Cough 10/30/2017   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   Chronic diastolic heart failure, NYHA class 3 (HCC) 10/18/2017   Hyperlipidemia 09/19/2017   Shortness of breath 09/19/2017   PND (paroxysmal nocturnal dyspnea) 09/14/2017   DOE (dyspnea on exertion) 09/14/2017   Recurrent right pleural effusion 08/28/2017   ESRD on dialysis (HCC) 08/27/2017   Lesion of vertebra 07/13/2017   Moderate protein malnutrition (HCC) 05/23/2017   Fluid overload, unspecified 05/06/2017   Hyperkalemia 05/01/2017   Personal history of urinary (tract) infections 01/15/2017   Encounter for fitting and adjustment of extracorporeal dialysis catheter (HCC) 09/27/2016   Coagulation defect, unspecified (HCC) 08/28/2016    Kidney transplant failure 08/28/2016   Secondary hyperparathyroidism of renal origin (HCC) 08/28/2016   Type 2 diabetes mellitus with diabetic chronic kidney disease (HCC) 08/28/2016   Renal transplant, status post 08/26/2016   Hypertension 08/26/2016   Anemia of chronic disease 04/09/2016   EKG abnormalities    Other hydronephrosis 03/04/2016     Subjective:  Ryan [name] is a 62 y.o. @GENDER @ with   ROS  Past Medical History:  Diagnosis Date   Allergy    Anemia    Blood transfusion without reported diagnosis    Dialysis patient (HCC)    Tues,thurs,sat   ESRD (end stage renal disease) (HCC)    TTHS-    Family history of adverse reaction to anesthesia    father had allergic reaction with anesthesia with a dental procedure-(pt. doesn't know)   Hyperlipidemia    Hypertension     Outpatient Medications Prior to Visit  Medication Sig Dispense Refill   acetaminophen (TYLENOL) 325 MG tablet Take 1-2 tablets (325-650 mg total) by mouth every 4 (four) hours as needed for mild pain.     arformoterol (BROVANA) 15 MCG/2ML NEBU Take 2 mLs (15 mcg total) by nebulization 2 (two) times daily.     atorvastatin (LIPITOR) 40 MG tablet Take 40 mg by mouth daily.     bisacodyl (DULCOLAX) 10 MG suppository Place 1 suppository (10 mg total) rectally daily as needed for moderate constipation.     calcitRIOL (ROCALTROL) 0.5 MCG capsule Take 1 capsule (0.5 mcg total) by mouth every Tuesday, Thursday, and Saturday at 6 PM.     calcium carbonate (TUMS - DOSED IN MG ELEMENTAL CALCIUM) 500 MG chewable tablet Chew 1 tablet (200 mg of elemental calcium total)  by mouth 4 (four) times daily as needed for indigestion or heartburn.     camphor-menthol (SARNA) lotion Apply topically as needed for itching.     Darbepoetin Alfa (ARANESP) 150 MCG/0.3ML SOSY injection Inject 0.3 mLs (150 mcg total) into the skin every Thursday at 6pm.     guaiFENesin (ROBITUSSIN) 100 MG/5ML liquid Take 5 mLs by mouth every  4 (four) hours as needed for cough or to loosen phlegm. 118 mL 0   ipratropium-albuterol (DUONEB) 0.5-2.5 (3) MG/3ML SOLN Take 3 mLs by nebulization every 4 (four) hours as needed.     midodrine (PROAMATINE) 10 MG tablet Take 2 tablets (20 mg total) by mouth 3 (three) times daily with meals. 180 tablet 1   multivitamin (RENA-VIT) TABS tablet Take 1 tablet by mouth daily.     Nutritional Supplements (FEEDING SUPPLEMENT, NEPRO CARB STEADY,) LIQD Take 237 mLs by mouth 3 (three) times daily between meals.     oxyCODONE 7.5 MG TABS Take 7.5-10 mg by mouth every 4 (four) hours as needed for moderate pain or severe pain. 30 tablet 0   pantoprazole (PROTONIX) 40 MG tablet Take 1 tablet (40 mg total) by mouth daily.     revefenacin (YUPELRI) 175 MCG/3ML nebulizer solution Take 3 mLs (175 mcg total) by nebulization daily.     sevelamer (RENAGEL) 800 MG tablet Take 2,400 mg by mouth 3 (three) times daily with meals.     sucroferric oxyhydroxide (VELPHORO) 500 MG chewable tablet Chew 2 tablets (1,000 mg total) by mouth 3 (three) times daily with meals. 90 tablet    traZODone (DESYREL) 50 MG tablet Take 1 tablet (50 mg total) by mouth at bedtime as needed for sleep. 15 tablet 0   No facility-administered medications prior to visit.     Allergies  Allergen Reactions   Iodinated Contrast Media Itching and Nausea Only    Probably needs prednisone prep prior to contrast    Social History   Tobacco Use   Smoking status: Never   Smokeless tobacco: Never  Vaping Use   Vaping status: Never Used  Substance Use Topics   Alcohol use: No   Drug use: No    Family History  Problem Relation Age of Onset   Hypertension Mother    Heart disease Mother 60       By his report, he thinks that she had heart attack.   Stroke Mother    Cancer Father    Kidney cancer Father    Hypertension Sister    Heart disease Maternal Grandmother    Alcohol abuse Maternal Grandfather    Mental illness Paternal Grandmother     Learning disabilities Paternal Grandmother        Alzheimer's    Stroke Paternal Grandfather    Colon cancer Neg Hx    Colon polyps Neg Hx    Esophageal cancer Neg Hx    Rectal cancer Neg Hx    Stomach cancer Neg Hx     Objective:  There were no vitals filed for this visit. There is no height or weight on file to calculate BMI.  Physical Exam  Lab Results: Lab Results  Component Value Date   WBC 9.8 11/07/2022   HGB 10.5 (L) 11/07/2022   HCT 34.9 (L) 11/07/2022   MCV 82.9 11/07/2022   PLT 240 11/07/2022    Lab Results  Component Value Date   CREATININE 4.84 (H) 11/07/2022   BUN 43 (H) 11/07/2022   NA 126 (L) 11/07/2022  K 3.5 11/07/2022   CL 88 (L) 11/07/2022   CO2 19 (L) 11/07/2022    Lab Results  Component Value Date   ALT 7 10/10/2022   AST 41 10/10/2022   ALKPHOS 107 10/10/2022   BILITOT 0.7 10/10/2022     Assessment & Plan:   Problem List Items Addressed This Visit   None   *** will return to clinic in *** {weeks/months} for follow up  Danelle Earthly, MD [facility]   11/18/22  6:09 AM

## 2022-11-19 DIAGNOSIS — K921 Melena: Secondary | ICD-10-CM | POA: Diagnosis not present

## 2022-11-19 DIAGNOSIS — K922 Gastrointestinal hemorrhage, unspecified: Secondary | ICD-10-CM | POA: Diagnosis not present

## 2022-11-21 DIAGNOSIS — K5730 Diverticulosis of large intestine without perforation or abscess without bleeding: Secondary | ICD-10-CM | POA: Diagnosis not present

## 2022-11-21 DIAGNOSIS — E875 Hyperkalemia: Secondary | ICD-10-CM | POA: Diagnosis not present

## 2022-11-21 DIAGNOSIS — Z4682 Encounter for fitting and adjustment of non-vascular catheter: Secondary | ICD-10-CM | POA: Diagnosis not present

## 2022-11-21 DIAGNOSIS — R0603 Acute respiratory distress: Secondary | ICD-10-CM | POA: Diagnosis not present

## 2022-11-21 DIAGNOSIS — K922 Gastrointestinal hemorrhage, unspecified: Secondary | ICD-10-CM | POA: Diagnosis not present

## 2022-11-21 DIAGNOSIS — D689 Coagulation defect, unspecified: Secondary | ICD-10-CM | POA: Diagnosis not present

## 2022-11-21 DIAGNOSIS — I959 Hypotension, unspecified: Secondary | ICD-10-CM | POA: Diagnosis not present

## 2022-11-21 DIAGNOSIS — R932 Abnormal findings on diagnostic imaging of liver and biliary tract: Secondary | ICD-10-CM | POA: Diagnosis not present

## 2022-11-21 DIAGNOSIS — I517 Cardiomegaly: Secondary | ICD-10-CM | POA: Diagnosis not present

## 2022-11-21 DIAGNOSIS — R0602 Shortness of breath: Secondary | ICD-10-CM | POA: Diagnosis not present

## 2022-11-21 DIAGNOSIS — N186 End stage renal disease: Secondary | ICD-10-CM | POA: Diagnosis not present

## 2022-11-21 DIAGNOSIS — K3189 Other diseases of stomach and duodenum: Secondary | ICD-10-CM | POA: Diagnosis not present

## 2022-11-21 DIAGNOSIS — I701 Atherosclerosis of renal artery: Secondary | ICD-10-CM | POA: Diagnosis not present

## 2022-11-21 DIAGNOSIS — K4090 Unilateral inguinal hernia, without obstruction or gangrene, not specified as recurrent: Secondary | ICD-10-CM | POA: Diagnosis not present

## 2022-11-21 DIAGNOSIS — R079 Chest pain, unspecified: Secondary | ICD-10-CM | POA: Diagnosis not present

## 2022-11-21 DIAGNOSIS — D696 Thrombocytopenia, unspecified: Secondary | ICD-10-CM | POA: Diagnosis not present

## 2022-11-21 DIAGNOSIS — R571 Hypovolemic shock: Secondary | ICD-10-CM | POA: Diagnosis not present

## 2022-11-21 DIAGNOSIS — E861 Hypovolemia: Secondary | ICD-10-CM | POA: Diagnosis not present

## 2022-11-21 DIAGNOSIS — Z992 Dependence on renal dialysis: Secondary | ICD-10-CM | POA: Diagnosis not present

## 2022-11-21 DIAGNOSIS — D62 Acute posthemorrhagic anemia: Secondary | ICD-10-CM | POA: Diagnosis not present

## 2022-11-21 DIAGNOSIS — R0989 Other specified symptoms and signs involving the circulatory and respiratory systems: Secondary | ICD-10-CM | POA: Diagnosis not present

## 2022-11-21 DIAGNOSIS — D631 Anemia in chronic kidney disease: Secondary | ICD-10-CM | POA: Diagnosis not present

## 2022-11-22 DIAGNOSIS — N186 End stage renal disease: Secondary | ICD-10-CM | POA: Diagnosis not present

## 2022-11-22 DIAGNOSIS — T8612 Kidney transplant failure: Secondary | ICD-10-CM | POA: Diagnosis not present

## 2022-11-22 DIAGNOSIS — Z452 Encounter for adjustment and management of vascular access device: Secondary | ICD-10-CM | POA: Diagnosis not present

## 2022-11-22 DIAGNOSIS — Z4682 Encounter for fitting and adjustment of non-vascular catheter: Secondary | ICD-10-CM | POA: Diagnosis not present

## 2022-11-22 DIAGNOSIS — I959 Hypotension, unspecified: Secondary | ICD-10-CM | POA: Diagnosis not present

## 2022-11-22 DIAGNOSIS — K922 Gastrointestinal hemorrhage, unspecified: Secondary | ICD-10-CM | POA: Diagnosis not present

## 2022-11-22 DIAGNOSIS — I270 Primary pulmonary hypertension: Secondary | ICD-10-CM | POA: Diagnosis not present

## 2022-11-22 DIAGNOSIS — I083 Combined rheumatic disorders of mitral, aortic and tricuspid valves: Secondary | ICD-10-CM | POA: Diagnosis not present

## 2022-11-22 DIAGNOSIS — Z992 Dependence on renal dialysis: Secondary | ICD-10-CM | POA: Diagnosis not present

## 2022-11-23 DIAGNOSIS — R918 Other nonspecific abnormal finding of lung field: Secondary | ICD-10-CM | POA: Diagnosis not present

## 2022-11-23 DIAGNOSIS — E872 Acidosis, unspecified: Secondary | ICD-10-CM | POA: Diagnosis not present

## 2022-11-23 DIAGNOSIS — Z992 Dependence on renal dialysis: Secondary | ICD-10-CM | POA: Diagnosis not present

## 2022-11-23 DIAGNOSIS — Z452 Encounter for adjustment and management of vascular access device: Secondary | ICD-10-CM | POA: Diagnosis not present

## 2022-11-23 DIAGNOSIS — I959 Hypotension, unspecified: Secondary | ICD-10-CM | POA: Diagnosis not present

## 2022-11-23 DIAGNOSIS — I700 Atherosclerosis of aorta: Secondary | ICD-10-CM | POA: Diagnosis not present

## 2022-11-23 DIAGNOSIS — K922 Gastrointestinal hemorrhage, unspecified: Secondary | ICD-10-CM | POA: Diagnosis not present

## 2022-11-23 DIAGNOSIS — N186 End stage renal disease: Secondary | ICD-10-CM | POA: Diagnosis not present

## 2022-11-23 DIAGNOSIS — J9690 Respiratory failure, unspecified, unspecified whether with hypoxia or hypercapnia: Secondary | ICD-10-CM | POA: Diagnosis not present

## 2022-11-23 DIAGNOSIS — R6521 Severe sepsis with septic shock: Secondary | ICD-10-CM | POA: Diagnosis not present

## 2022-11-23 DIAGNOSIS — J9601 Acute respiratory failure with hypoxia: Secondary | ICD-10-CM | POA: Diagnosis not present

## 2022-11-23 DIAGNOSIS — I82411 Acute embolism and thrombosis of right femoral vein: Secondary | ICD-10-CM | POA: Diagnosis not present

## 2022-11-23 DIAGNOSIS — K760 Fatty (change of) liver, not elsewhere classified: Secondary | ICD-10-CM | POA: Diagnosis not present

## 2022-11-23 DIAGNOSIS — A4189 Other specified sepsis: Secondary | ICD-10-CM | POA: Diagnosis not present

## 2022-11-24 DIAGNOSIS — J984 Other disorders of lung: Secondary | ICD-10-CM | POA: Diagnosis not present

## 2022-11-24 DIAGNOSIS — R579 Shock, unspecified: Secondary | ICD-10-CM | POA: Diagnosis not present

## 2022-11-24 DIAGNOSIS — Z9911 Dependence on respirator [ventilator] status: Secondary | ICD-10-CM | POA: Diagnosis not present

## 2022-11-24 DIAGNOSIS — J9601 Acute respiratory failure with hypoxia: Secondary | ICD-10-CM | POA: Diagnosis not present

## 2022-11-24 DIAGNOSIS — J9690 Respiratory failure, unspecified, unspecified whether with hypoxia or hypercapnia: Secondary | ICD-10-CM | POA: Diagnosis not present

## 2022-11-24 DIAGNOSIS — K3189 Other diseases of stomach and duodenum: Secondary | ICD-10-CM | POA: Diagnosis not present

## 2022-11-25 DIAGNOSIS — R918 Other nonspecific abnormal finding of lung field: Secondary | ICD-10-CM | POA: Diagnosis not present

## 2022-11-25 DIAGNOSIS — J9601 Acute respiratory failure with hypoxia: Secondary | ICD-10-CM | POA: Diagnosis not present

## 2022-11-25 DIAGNOSIS — I499 Cardiac arrhythmia, unspecified: Secondary | ICD-10-CM | POA: Diagnosis not present

## 2022-11-25 DIAGNOSIS — J9690 Respiratory failure, unspecified, unspecified whether with hypoxia or hypercapnia: Secondary | ICD-10-CM | POA: Diagnosis not present

## 2022-11-25 DIAGNOSIS — I4892 Unspecified atrial flutter: Secondary | ICD-10-CM | POA: Diagnosis not present

## 2022-11-25 DIAGNOSIS — J90 Pleural effusion, not elsewhere classified: Secondary | ICD-10-CM | POA: Diagnosis not present

## 2022-11-25 DIAGNOSIS — R000 Tachycardia, unspecified: Secondary | ICD-10-CM | POA: Diagnosis not present

## 2022-11-25 DIAGNOSIS — I2510 Atherosclerotic heart disease of native coronary artery without angina pectoris: Secondary | ICD-10-CM | POA: Diagnosis not present

## 2022-11-25 DIAGNOSIS — Z4682 Encounter for fitting and adjustment of non-vascular catheter: Secondary | ICD-10-CM | POA: Diagnosis not present

## 2022-11-26 DIAGNOSIS — Z4682 Encounter for fitting and adjustment of non-vascular catheter: Secondary | ICD-10-CM | POA: Diagnosis not present

## 2022-11-26 DIAGNOSIS — I6502 Occlusion and stenosis of left vertebral artery: Secondary | ICD-10-CM | POA: Diagnosis not present

## 2022-11-26 DIAGNOSIS — R918 Other nonspecific abnormal finding of lung field: Secondary | ICD-10-CM | POA: Diagnosis not present

## 2022-11-26 DIAGNOSIS — I6523 Occlusion and stenosis of bilateral carotid arteries: Secondary | ICD-10-CM | POA: Diagnosis not present

## 2022-11-26 DIAGNOSIS — R0989 Other specified symptoms and signs involving the circulatory and respiratory systems: Secondary | ICD-10-CM | POA: Diagnosis not present

## 2022-11-26 DIAGNOSIS — R590 Localized enlarged lymph nodes: Secondary | ICD-10-CM | POA: Diagnosis not present

## 2022-11-26 DIAGNOSIS — K922 Gastrointestinal hemorrhage, unspecified: Secondary | ICD-10-CM | POA: Diagnosis not present

## 2022-11-27 DIAGNOSIS — I351 Nonrheumatic aortic (valve) insufficiency: Secondary | ICD-10-CM | POA: Diagnosis not present

## 2022-11-27 DIAGNOSIS — N261 Atrophy of kidney (terminal): Secondary | ICD-10-CM | POA: Diagnosis not present

## 2022-11-27 DIAGNOSIS — I361 Nonrheumatic tricuspid (valve) insufficiency: Secondary | ICD-10-CM | POA: Diagnosis not present

## 2022-11-27 DIAGNOSIS — K824 Cholesterolosis of gallbladder: Secondary | ICD-10-CM | POA: Diagnosis not present

## 2022-11-27 DIAGNOSIS — K760 Fatty (change of) liver, not elsewhere classified: Secondary | ICD-10-CM | POA: Diagnosis not present

## 2022-11-27 DIAGNOSIS — K828 Other specified diseases of gallbladder: Secondary | ICD-10-CM | POA: Diagnosis not present

## 2022-11-27 DIAGNOSIS — I371 Nonrheumatic pulmonary valve insufficiency: Secondary | ICD-10-CM | POA: Diagnosis not present

## 2022-11-27 DIAGNOSIS — I340 Nonrheumatic mitral (valve) insufficiency: Secondary | ICD-10-CM | POA: Diagnosis not present

## 2022-11-27 DIAGNOSIS — I959 Hypotension, unspecified: Secondary | ICD-10-CM | POA: Diagnosis not present

## 2022-11-29 DIAGNOSIS — N186 End stage renal disease: Secondary | ICD-10-CM | POA: Diagnosis not present

## 2022-11-29 DIAGNOSIS — R579 Shock, unspecified: Secondary | ICD-10-CM | POA: Diagnosis not present

## 2022-11-29 DIAGNOSIS — J9601 Acute respiratory failure with hypoxia: Secondary | ICD-10-CM | POA: Diagnosis not present

## 2022-11-29 DIAGNOSIS — R0902 Hypoxemia: Secondary | ICD-10-CM | POA: Diagnosis not present

## 2022-11-29 DIAGNOSIS — A4189 Other specified sepsis: Secondary | ICD-10-CM | POA: Diagnosis not present

## 2022-11-29 DIAGNOSIS — K760 Fatty (change of) liver, not elsewhere classified: Secondary | ICD-10-CM | POA: Diagnosis not present

## 2022-11-29 DIAGNOSIS — R0989 Other specified symptoms and signs involving the circulatory and respiratory systems: Secondary | ICD-10-CM | POA: Diagnosis not present

## 2022-11-29 DIAGNOSIS — R6521 Severe sepsis with septic shock: Secondary | ICD-10-CM | POA: Diagnosis not present

## 2022-11-29 DIAGNOSIS — R918 Other nonspecific abnormal finding of lung field: Secondary | ICD-10-CM | POA: Diagnosis not present

## 2022-11-29 DIAGNOSIS — E875 Hyperkalemia: Secondary | ICD-10-CM | POA: Diagnosis not present

## 2022-11-29 DIAGNOSIS — I959 Hypotension, unspecified: Secondary | ICD-10-CM | POA: Diagnosis not present

## 2022-11-29 DIAGNOSIS — Z992 Dependence on renal dialysis: Secondary | ICD-10-CM | POA: Diagnosis not present

## 2022-11-29 DIAGNOSIS — I517 Cardiomegaly: Secondary | ICD-10-CM | POA: Diagnosis not present

## 2022-11-29 DIAGNOSIS — K922 Gastrointestinal hemorrhage, unspecified: Secondary | ICD-10-CM | POA: Diagnosis not present

## 2022-11-29 DIAGNOSIS — I50810 Right heart failure, unspecified: Secondary | ICD-10-CM | POA: Diagnosis not present

## 2022-11-29 DIAGNOSIS — D631 Anemia in chronic kidney disease: Secondary | ICD-10-CM | POA: Diagnosis not present

## 2022-11-29 DIAGNOSIS — D62 Acute posthemorrhagic anemia: Secondary | ICD-10-CM | POA: Diagnosis not present

## 2023-08-29 ENCOUNTER — Other Ambulatory Visit (HOSPITAL_COMMUNITY): Payer: Self-pay

## 2023-11-13 ENCOUNTER — Other Ambulatory Visit: Payer: Self-pay
